# Patient Record
Sex: Female | Born: 1965 | Race: Black or African American | Hispanic: No | Smoking: Never smoker
Health system: Southern US, Community
[De-identification: ages and names within clinical notes are randomized; demographics above are authoritative.]

## PROBLEM LIST (undated history)

## (undated) DIAGNOSIS — M545 Low back pain, unspecified: Secondary | ICD-10-CM

## (undated) DIAGNOSIS — F419 Anxiety disorder, unspecified: Secondary | ICD-10-CM

## (undated) DIAGNOSIS — G478 Other sleep disorders: Secondary | ICD-10-CM

## (undated) DIAGNOSIS — N319 Neuromuscular dysfunction of bladder, unspecified: Secondary | ICD-10-CM

## (undated) DIAGNOSIS — D649 Anemia, unspecified: Secondary | ICD-10-CM

## (undated) DIAGNOSIS — M1990 Unspecified osteoarthritis, unspecified site: Secondary | ICD-10-CM

## (undated) DIAGNOSIS — G8929 Other chronic pain: Secondary | ICD-10-CM

## (undated) DIAGNOSIS — Z860101 Personal history of adenomatous and serrated colon polyps: Secondary | ICD-10-CM

## (undated) DIAGNOSIS — L8990 Pressure ulcer of unspecified site, unspecified stage: Secondary | ICD-10-CM

## (undated) DIAGNOSIS — N390 Urinary tract infection, site not specified: Secondary | ICD-10-CM

## (undated) DIAGNOSIS — G709 Myoneural disorder, unspecified: Secondary | ICD-10-CM

## (undated) DIAGNOSIS — I10 Essential (primary) hypertension: Secondary | ICD-10-CM

## (undated) DIAGNOSIS — Z86010 Personal history of colonic polyps: Secondary | ICD-10-CM

## (undated) DIAGNOSIS — T884XXA Failed or difficult intubation, initial encounter: Secondary | ICD-10-CM

## (undated) DIAGNOSIS — I639 Cerebral infarction, unspecified: Secondary | ICD-10-CM

## (undated) DIAGNOSIS — E1010 Type 1 diabetes mellitus with ketoacidosis without coma: Secondary | ICD-10-CM

## (undated) DIAGNOSIS — F329 Major depressive disorder, single episode, unspecified: Secondary | ICD-10-CM

## (undated) DIAGNOSIS — G2581 Restless legs syndrome: Secondary | ICD-10-CM

## (undated) DIAGNOSIS — G35 Multiple sclerosis: Secondary | ICD-10-CM

## (undated) DIAGNOSIS — R011 Cardiac murmur, unspecified: Secondary | ICD-10-CM

## (undated) DIAGNOSIS — K648 Other hemorrhoids: Secondary | ICD-10-CM

## (undated) DIAGNOSIS — E785 Hyperlipidemia, unspecified: Secondary | ICD-10-CM

## (undated) DIAGNOSIS — A419 Sepsis, unspecified organism: Secondary | ICD-10-CM

## (undated) DIAGNOSIS — M797 Fibromyalgia: Secondary | ICD-10-CM

## (undated) DIAGNOSIS — K219 Gastro-esophageal reflux disease without esophagitis: Secondary | ICD-10-CM

## (undated) DIAGNOSIS — I208 Other forms of angina pectoris: Secondary | ICD-10-CM

## (undated) DIAGNOSIS — I2089 Other forms of angina pectoris: Secondary | ICD-10-CM

## (undated) HISTORY — DX: Major depressive disorder, single episode, unspecified: F32.9

## (undated) HISTORY — DX: Other forms of angina pectoris: I20.8

## (undated) HISTORY — DX: Myoneural disorder, unspecified: G70.9

## (undated) HISTORY — PX: FRACTURE SURGERY: SHX138

## (undated) HISTORY — DX: Other hemorrhoids: K64.8

## (undated) HISTORY — DX: Personal history of colonic polyps: Z86.010

## (undated) HISTORY — DX: Hyperlipidemia, unspecified: E78.5

## (undated) HISTORY — DX: Essential (primary) hypertension: I10

## (undated) HISTORY — PX: BREAST SURGERY: SHX581

## (undated) HISTORY — DX: Cerebral infarction, unspecified: I63.9

## (undated) HISTORY — PX: COLONOSCOPY W/ BIOPSIES AND POLYPECTOMY: SHX1376

## (undated) HISTORY — DX: Gastro-esophageal reflux disease without esophagitis: K21.9

## (undated) HISTORY — DX: Cardiac murmur, unspecified: R01.1

## (undated) HISTORY — PX: EYE SURGERY: SHX253

## (undated) HISTORY — PX: CARDIAC CATHETERIZATION: SHX172

## (undated) HISTORY — PX: TRIGGER FINGER RELEASE: SHX641

## (undated) HISTORY — DX: Personal history of adenomatous and serrated colon polyps: Z86.0101

## (undated) HISTORY — PX: TUBAL LIGATION: SHX77

## (undated) HISTORY — DX: Anxiety disorder, unspecified: F41.9

## (undated) HISTORY — DX: Other forms of angina pectoris: I20.89

## (undated) HISTORY — PX: LAPAROSCOPIC CHOLECYSTECTOMY: SUR755

## (undated) HISTORY — PX: FOOT SURGERY: SHX648

## (undated) HISTORY — DX: Anemia, unspecified: D64.9

---

## 1988-02-28 DIAGNOSIS — F32A Depression, unspecified: Secondary | ICD-10-CM

## 1988-02-28 DIAGNOSIS — I639 Cerebral infarction, unspecified: Secondary | ICD-10-CM

## 1988-02-28 HISTORY — DX: Depression, unspecified: F32.A

## 1988-02-28 HISTORY — DX: Cerebral infarction, unspecified: I63.9

## 1998-02-03 ENCOUNTER — Emergency Department (HOSPITAL_COMMUNITY): Admission: EM | Admit: 1998-02-03 | Discharge: 1998-02-03 | Payer: Self-pay | Admitting: Emergency Medicine

## 1998-06-20 ENCOUNTER — Observation Stay (HOSPITAL_COMMUNITY): Admission: EM | Admit: 1998-06-20 | Discharge: 1998-06-21 | Payer: Self-pay | Admitting: *Deleted

## 1999-04-13 ENCOUNTER — Emergency Department (HOSPITAL_COMMUNITY): Admission: EM | Admit: 1999-04-13 | Discharge: 1999-04-13 | Payer: Self-pay | Admitting: Emergency Medicine

## 1999-05-01 ENCOUNTER — Inpatient Hospital Stay (HOSPITAL_COMMUNITY): Admission: EM | Admit: 1999-05-01 | Discharge: 1999-05-04 | Payer: Self-pay | Admitting: Emergency Medicine

## 1999-05-01 ENCOUNTER — Encounter (INDEPENDENT_AMBULATORY_CARE_PROVIDER_SITE_OTHER): Payer: Self-pay | Admitting: *Deleted

## 1999-05-03 ENCOUNTER — Encounter: Payer: Self-pay | Admitting: Internal Medicine

## 1999-05-09 ENCOUNTER — Encounter: Admission: RE | Admit: 1999-05-09 | Discharge: 1999-08-07 | Payer: Self-pay | Admitting: *Deleted

## 1999-05-11 ENCOUNTER — Encounter: Payer: Self-pay | Admitting: Emergency Medicine

## 1999-05-11 ENCOUNTER — Emergency Department (HOSPITAL_COMMUNITY): Admission: EM | Admit: 1999-05-11 | Discharge: 1999-05-11 | Payer: Self-pay | Admitting: Emergency Medicine

## 1999-05-27 ENCOUNTER — Encounter: Admission: RE | Admit: 1999-05-27 | Discharge: 1999-05-27 | Payer: Self-pay | Admitting: Sports Medicine

## 1999-06-23 ENCOUNTER — Emergency Department (HOSPITAL_COMMUNITY): Admission: EM | Admit: 1999-06-23 | Discharge: 1999-06-23 | Payer: Self-pay | Admitting: Emergency Medicine

## 1999-06-24 ENCOUNTER — Encounter: Admission: RE | Admit: 1999-06-24 | Discharge: 1999-06-24 | Payer: Self-pay | Admitting: Family Medicine

## 2000-03-22 ENCOUNTER — Inpatient Hospital Stay (HOSPITAL_COMMUNITY): Admission: EM | Admit: 2000-03-22 | Discharge: 2000-03-23 | Payer: Self-pay | Admitting: Emergency Medicine

## 2000-03-23 ENCOUNTER — Encounter: Payer: Self-pay | Admitting: Family Medicine

## 2000-03-27 ENCOUNTER — Encounter: Admission: RE | Admit: 2000-03-27 | Discharge: 2000-03-27 | Payer: Self-pay | Admitting: Family Medicine

## 2000-07-26 ENCOUNTER — Ambulatory Visit (HOSPITAL_COMMUNITY): Admission: RE | Admit: 2000-07-26 | Discharge: 2000-07-26 | Payer: Self-pay | Admitting: Cardiology

## 2001-03-05 ENCOUNTER — Encounter: Payer: Self-pay | Admitting: Internal Medicine

## 2001-03-05 ENCOUNTER — Ambulatory Visit (HOSPITAL_COMMUNITY): Admission: RE | Admit: 2001-03-05 | Discharge: 2001-03-05 | Payer: Self-pay | Admitting: Internal Medicine

## 2001-03-09 ENCOUNTER — Emergency Department (HOSPITAL_COMMUNITY): Admission: EM | Admit: 2001-03-09 | Discharge: 2001-03-09 | Payer: Self-pay

## 2001-03-22 ENCOUNTER — Emergency Department (HOSPITAL_COMMUNITY): Admission: EM | Admit: 2001-03-22 | Discharge: 2001-03-22 | Payer: Self-pay | Admitting: Emergency Medicine

## 2001-03-27 ENCOUNTER — Encounter (HOSPITAL_BASED_OUTPATIENT_CLINIC_OR_DEPARTMENT_OTHER): Payer: Self-pay | Admitting: General Surgery

## 2001-03-29 ENCOUNTER — Encounter (HOSPITAL_BASED_OUTPATIENT_CLINIC_OR_DEPARTMENT_OTHER): Payer: Self-pay | Admitting: General Surgery

## 2001-03-29 ENCOUNTER — Ambulatory Visit (HOSPITAL_COMMUNITY): Admission: RE | Admit: 2001-03-29 | Discharge: 2001-03-30 | Payer: Self-pay | Admitting: General Surgery

## 2001-03-29 ENCOUNTER — Encounter (INDEPENDENT_AMBULATORY_CARE_PROVIDER_SITE_OTHER): Payer: Self-pay | Admitting: Specialist

## 2001-04-01 ENCOUNTER — Inpatient Hospital Stay (HOSPITAL_COMMUNITY): Admission: EM | Admit: 2001-04-01 | Discharge: 2001-04-03 | Payer: Self-pay | Admitting: Emergency Medicine

## 2001-04-05 ENCOUNTER — Ambulatory Visit: Admission: RE | Admit: 2001-04-05 | Discharge: 2001-04-05 | Payer: Self-pay | Admitting: Family Medicine

## 2001-04-11 ENCOUNTER — Encounter: Admission: RE | Admit: 2001-04-11 | Discharge: 2001-07-10 | Payer: Self-pay | Admitting: Family Medicine

## 2001-07-25 ENCOUNTER — Other Ambulatory Visit: Admission: RE | Admit: 2001-07-25 | Discharge: 2001-07-25 | Payer: Self-pay | Admitting: Ophthalmology

## 2002-04-01 ENCOUNTER — Emergency Department (HOSPITAL_COMMUNITY): Admission: EM | Admit: 2002-04-01 | Discharge: 2002-04-01 | Payer: Self-pay | Admitting: *Deleted

## 2002-05-02 ENCOUNTER — Emergency Department (HOSPITAL_COMMUNITY): Admission: EM | Admit: 2002-05-02 | Discharge: 2002-05-02 | Payer: Self-pay

## 2002-09-30 ENCOUNTER — Inpatient Hospital Stay (HOSPITAL_COMMUNITY): Admission: AD | Admit: 2002-09-30 | Discharge: 2002-10-01 | Payer: Self-pay | Admitting: Family Medicine

## 2003-01-07 ENCOUNTER — Inpatient Hospital Stay (HOSPITAL_COMMUNITY): Admission: EM | Admit: 2003-01-07 | Discharge: 2003-01-09 | Payer: Self-pay | Admitting: Emergency Medicine

## 2003-07-09 ENCOUNTER — Emergency Department (HOSPITAL_COMMUNITY): Admission: EM | Admit: 2003-07-09 | Discharge: 2003-07-09 | Payer: Self-pay | Admitting: *Deleted

## 2003-07-14 ENCOUNTER — Emergency Department (HOSPITAL_COMMUNITY): Admission: EM | Admit: 2003-07-14 | Discharge: 2003-07-14 | Payer: Self-pay | Admitting: Emergency Medicine

## 2003-08-11 ENCOUNTER — Ambulatory Visit (HOSPITAL_COMMUNITY): Admission: RE | Admit: 2003-08-11 | Discharge: 2003-08-11 | Payer: Self-pay | Admitting: Endocrinology

## 2003-11-06 ENCOUNTER — Ambulatory Visit (HOSPITAL_COMMUNITY): Admission: RE | Admit: 2003-11-06 | Discharge: 2003-11-06 | Payer: Self-pay | Admitting: Internal Medicine

## 2003-11-06 ENCOUNTER — Encounter (INDEPENDENT_AMBULATORY_CARE_PROVIDER_SITE_OTHER): Payer: Self-pay | Admitting: Specialist

## 2004-08-18 ENCOUNTER — Encounter: Admission: RE | Admit: 2004-08-18 | Discharge: 2004-10-06 | Payer: Self-pay | Admitting: Orthopedic Surgery

## 2004-10-10 ENCOUNTER — Encounter: Admission: RE | Admit: 2004-10-10 | Discharge: 2005-01-08 | Payer: Self-pay | Admitting: Orthopedic Surgery

## 2004-10-19 ENCOUNTER — Emergency Department (HOSPITAL_COMMUNITY): Admission: EM | Admit: 2004-10-19 | Discharge: 2004-10-19 | Payer: Self-pay | Admitting: Family Medicine

## 2004-11-05 ENCOUNTER — Emergency Department (HOSPITAL_COMMUNITY): Admission: EM | Admit: 2004-11-05 | Discharge: 2004-11-06 | Payer: Self-pay | Admitting: Emergency Medicine

## 2005-05-10 ENCOUNTER — Emergency Department (HOSPITAL_COMMUNITY): Admission: EM | Admit: 2005-05-10 | Discharge: 2005-05-10 | Payer: Self-pay | Admitting: Family Medicine

## 2005-06-28 ENCOUNTER — Encounter: Payer: Self-pay | Admitting: Internal Medicine

## 2005-06-30 ENCOUNTER — Encounter: Admission: RE | Admit: 2005-06-30 | Discharge: 2005-06-30 | Payer: Self-pay | Admitting: Family Medicine

## 2005-07-20 ENCOUNTER — Inpatient Hospital Stay (HOSPITAL_COMMUNITY): Admission: EM | Admit: 2005-07-20 | Discharge: 2005-07-21 | Payer: Self-pay | Admitting: *Deleted

## 2006-01-01 ENCOUNTER — Emergency Department (HOSPITAL_COMMUNITY): Admission: EM | Admit: 2006-01-01 | Discharge: 2006-01-02 | Payer: Self-pay | Admitting: Emergency Medicine

## 2006-04-10 ENCOUNTER — Ambulatory Visit: Payer: Self-pay | Admitting: Internal Medicine

## 2006-04-24 ENCOUNTER — Ambulatory Visit (HOSPITAL_COMMUNITY): Admission: RE | Admit: 2006-04-24 | Discharge: 2006-04-24 | Payer: Self-pay | Admitting: Internal Medicine

## 2006-04-24 DIAGNOSIS — K648 Other hemorrhoids: Secondary | ICD-10-CM

## 2006-04-24 HISTORY — DX: Other hemorrhoids: K64.8

## 2006-04-27 ENCOUNTER — Ambulatory Visit: Payer: Self-pay | Admitting: Internal Medicine

## 2006-05-22 ENCOUNTER — Ambulatory Visit (HOSPITAL_COMMUNITY): Admission: RE | Admit: 2006-05-22 | Discharge: 2006-05-23 | Payer: Self-pay | Admitting: Ophthalmology

## 2006-12-09 ENCOUNTER — Emergency Department (HOSPITAL_COMMUNITY): Admission: EM | Admit: 2006-12-09 | Discharge: 2006-12-09 | Payer: Self-pay | Admitting: Emergency Medicine

## 2007-02-03 ENCOUNTER — Ambulatory Visit (HOSPITAL_BASED_OUTPATIENT_CLINIC_OR_DEPARTMENT_OTHER): Admission: RE | Admit: 2007-02-03 | Discharge: 2007-02-03 | Payer: Self-pay | Admitting: Neurology

## 2007-03-14 ENCOUNTER — Ambulatory Visit: Payer: Self-pay | Admitting: Pulmonary Disease

## 2007-04-10 ENCOUNTER — Emergency Department (HOSPITAL_COMMUNITY): Admission: EM | Admit: 2007-04-10 | Discharge: 2007-04-10 | Payer: Self-pay | Admitting: Emergency Medicine

## 2007-07-31 ENCOUNTER — Encounter: Admission: RE | Admit: 2007-07-31 | Discharge: 2007-09-02 | Payer: Self-pay | Admitting: Orthopedic Surgery

## 2007-09-26 ENCOUNTER — Encounter: Admission: RE | Admit: 2007-09-26 | Discharge: 2007-12-25 | Payer: Self-pay | Admitting: Orthopedic Surgery

## 2008-06-27 ENCOUNTER — Emergency Department (HOSPITAL_COMMUNITY): Admission: EM | Admit: 2008-06-27 | Discharge: 2008-06-27 | Payer: Self-pay | Admitting: Emergency Medicine

## 2008-09-04 ENCOUNTER — Encounter: Admission: RE | Admit: 2008-09-04 | Discharge: 2008-09-04 | Payer: Self-pay | Admitting: Neurology

## 2009-12-28 HISTORY — PX: ENDOMETRIAL ABLATION W/ NOVASURE: SUR434

## 2010-07-06 ENCOUNTER — Ambulatory Visit (INDEPENDENT_AMBULATORY_CARE_PROVIDER_SITE_OTHER): Payer: Self-pay | Admitting: Internal Medicine

## 2010-07-06 ENCOUNTER — Encounter: Payer: Self-pay | Admitting: Internal Medicine

## 2010-07-06 ENCOUNTER — Ambulatory Visit: Payer: Self-pay | Admitting: Licensed Clinical Social Worker

## 2010-07-06 ENCOUNTER — Ambulatory Visit (INDEPENDENT_AMBULATORY_CARE_PROVIDER_SITE_OTHER): Payer: Self-pay | Admitting: Dietician

## 2010-07-06 ENCOUNTER — Other Ambulatory Visit: Payer: Self-pay | Admitting: Internal Medicine

## 2010-07-06 DIAGNOSIS — E109 Type 1 diabetes mellitus without complications: Secondary | ICD-10-CM

## 2010-07-06 DIAGNOSIS — F329 Major depressive disorder, single episode, unspecified: Secondary | ICD-10-CM

## 2010-07-06 DIAGNOSIS — G43909 Migraine, unspecified, not intractable, without status migrainosus: Secondary | ICD-10-CM | POA: Insufficient documentation

## 2010-07-06 DIAGNOSIS — J45909 Unspecified asthma, uncomplicated: Secondary | ICD-10-CM | POA: Insufficient documentation

## 2010-07-06 DIAGNOSIS — E119 Type 2 diabetes mellitus without complications: Secondary | ICD-10-CM

## 2010-07-06 DIAGNOSIS — I10 Essential (primary) hypertension: Secondary | ICD-10-CM | POA: Insufficient documentation

## 2010-07-06 LAB — BASIC METABOLIC PANEL WITH GFR
BUN: 9 mg/dL (ref 6–23)
Calcium: 9.4 mg/dL (ref 8.4–10.5)
Chloride: 97 mEq/L (ref 96–112)
Creat: 0.59 mg/dL (ref 0.40–1.20)
GFR, Est African American: >60 mL/min (ref >60)
GFR, Est Non African American: >60 mL/min (ref >60)

## 2010-07-06 LAB — POCT GLYCOSYLATED HEMOGLOBIN (HGB A1C): Hemoglobin A1C: 11.4

## 2010-07-06 MED ORDER — FUROSEMIDE 20 MG PO TABS: 20.0000 mg | ORAL_TABLET | Freq: Two times a day (BID) | ORAL | Status: DC

## 2010-07-06 MED ORDER — CLONIDINE HCL 0.3 MG PO TABS: 0.3000 mg | ORAL_TABLET | Freq: Two times a day (BID) | ORAL | Status: DC

## 2010-07-06 MED ORDER — INSULIN LISPRO 100 UNIT/ML ~~LOC~~ SOLN: SUBCUTANEOUS | Status: DC

## 2010-07-06 MED ORDER — LISINOPRIL 40 MG PO TABS: 40.0000 mg | ORAL_TABLET | Freq: Every day | ORAL | Status: DC

## 2010-07-06 MED ORDER — INSULIN ASPART 100 UNIT/ML ~~LOC~~ SOLN: SUBCUTANEOUS | Status: DC

## 2010-07-06 NOTE — Assessment & Plan Note (Signed)
As mentioned history of present illness, she is getting samples from Dr. Daphine Deutscher for her hypertension medications. She has been tried on multiple medications in the past but her pressure stays high.  We'll try to get records from Dr. Ermalene Searing office, which will be greatly helpful because she's following her since 2001. -We'll start her on lisinopril, Lasix, clonidine considering these being in the $4 group. - Check her BMP shows creatinine 0.59 and potassium 4.3. -Wanted to start spironolactone today but will not start it along with ACE inhibitor, at the same time due to high risk of severe hyperkalemia. -So we'll give her Lasix today and recheck her BMP next visit and start adding and changing blood pressure medications and felt needed.

## 2010-07-06 NOTE — Progress Notes (Signed)
30 minutes.  Soc. Work.  Referred due to uncontrolled diabetes and depression for assessment and planning.   Debra [name] met with me in my office and I find a very pleasant and forthcoming woman.   She gave me some history in that she has adult children and lives with her boyfriend who supports her right now.  She has no income and tells me she cannot work due to low vision, peripheral neuropathy, trigger finger and IBS.  She has applied for Disability several times over the years but was denied.  She reports that she cannot work because of her hands and vision as well as severe diarrhea.  She was dropped off today at the clinics and tells me she can get to appointments if given time to arrange.   She is seeing Chauncey Reading today to provide all financial information and this will give her needed access.  She had previously qualified for Medicaid however she and her children have aged out of that program.   We talked about the relationship between uncontrolled diabetes and depression and that will be extremely important to get that under control.  I did give her information about counseling and she can think about that option thru [facility].  She has been on Cymbalta for many years and that has helped her depression somewhat.   I did give her information on self-management of depression.  She also seems to be isolated and tells me she doesn't do very much outside of her home.   SW will continue to work with patient.

## 2010-07-06 NOTE — Progress Notes (Signed)
Diabetes Self-Management Training (DSMT)  Initial Visit  07/06/2010 Ms. Torre Pikus, identified by name and date of birth, is a 45 y.o. female with Type 1 Diabetes. Year of diabetes diagnosis: age 65 - 2 years after son was born. Other persons present: no  ASSESSMENT Patient concerns are Problem solving, Support and financial guidance.  Last menstrual period 06/25/2010. There is no height or weight on file to calculate BMI. No results found for this basename: LDLCALC   Lab Results  Component Value Date   HGBA1C 11.4 07/06/2010   Medication Nutrition Monitor: One Touch Ultra Link- strips from 2010, but control solution read within range indicating she is still getting accurate results  Labs reviewed.  DIABETES BUNDLE: A1C in past 6 months? Yes.  Less than 7%? No LDL in past year? No. Microalbumin ratio in past year? No. Patient taking ACE or ARB? No.  Sent note to MD. Blood pressure less than 130/80? No.  Sent note to MD. Foot exam in last year? No. Sent note to MD. Eye exam in past year? No.  Reminded patient to schedule eye exam. Tobacco use? No. Pneumovax? No Flu vaccine? No Asprin? No  Family history of diabetes: No  Support systems: talks to children almost daily, one lives near [place] the other in [place]   Special needs: Unable to determine  Prior DM Education: Yes   Medications See Medications list.  Has adequate knowledge and Needs skills/knowledge review  Patients belief/attitude about diabetes: No matter what I do I can't control my blood glucose levels.  Self foot exams daily: No  Diabetes Complications: Neuropathy Retinopathy Gastroparesis   Exercise Plan Doing ADLs for 15 minutesa day.   Self-Monitoring Frequency of testing: 2 times/day See meter download from today  Hyperglycemia: Yes Daily Hypoglycemia: Yes Daily   Meal Planning Some knowledge and not able to fully assess her skills, but verbalizes adequate basic    Assessment  comments: patient says her diabetes has never been well controlled and describes living with diabetes as being a "nightmare". She reports having "brittle" diabetes. Current rates in pump set by Dr. Loreli Slot in 2009. Chronic diarrhea since gallbladder removed. Operations on feet and hands. Highest non-pregnant weight was in 200s, clearly shows abdominal obesity today. Patient reports she has been on the insulin pump since 2008, prior to that had been on various regimens up to a total daily dose of 600 units: regimens  included lantus and novolog, Humulin N and R, Humulin R U 500 in pump and trial of symlin which caused nausea, Currently hs 90 loan of Med[employer]onic Paradigm revel pump, using same infusion set since March- has about 3 more at home, reports having plenty of syringes. CDE checked clock was accurate- unable to download pump as yet, but reported setting are as follows:  Basal rates:                                              Insulin to carb ratio = 4 units/15 grams 12 am-4 am    3.5 units/hr                         Sensitivity Factor = 25 (1 units lowers her blood sugar  4 am- 11 am- 4.5 units/hr                                                               ~  25 mg/dl) 11 am-5 Pm 4.8 units/hr                            Target Blood sugar in pump settings is 1110-120 5 Pm- 12 am 5.0 units/hr  Total Daily dose = 99.7 units/day Requested last 25 boluses in her bolus history, but was told her old pump recently broke and she had to begin using this loaner so only has last 4 days of boluses: 5/9- none  5/8  1930- 25 units 5/4  1812- 11.9 units        1229- 12 units        1148 12.7 units          840  10 units  Patient reports no alcohol or drugs other than physician prescribed. Preeclampsia with pregnancy- otherwise no complications.   Although patient reports daily fasting lows that she treats without checking and often turns pump off at night. She turned it off last night from midnight to 3 am     INDIVIDUAL DIABETES EDUCATION PLAN:  Psychosocial adjustment _______________________________________________________________________  Intervention TOPICS COVERED TODAY:  Psychosocial adjustment  Worked with patient to identify barriers to care and solutions Helped patient identify a support system for diabetes management Identified and addressed patients feelings and concerns about diabetes  PATIENTS GOALS/PLAN (copy and paste in patient instructions so patient receives a copy): 1.  Learning Objective:       Referred to social work for significant emotional difficulty and to investigate counseling for depression, reported agoraphobia.  2.  Behavioral Objective:         Healthy Coping: For help with diabetes control, I will discuss diabetes with social work  Sometimes 25%  Personalized Follow-Up Plan for Ongoing Self Management Support:  Doctor's Office and CDE visits ______________________________________________________________________   Outcomes Expected outcomes: Demonstrated limited interest in learning. Expect minimal changes.  Self-care Barriers: Debilitated state due to current medical condition, Lack of material resources, Coping skills, Cognitive impairment  Education material provided: no  Patient to contact team via Phone if problems or questions.  Time in: 1030     Time out: 1130  Future DSMT - 2 wks   RILEY,Nykerria Macconnell          Addendum_ discussed patient care with social work and physicians. Suggested to both patient and physicians that patient consider injection therapy as she is high risk of infections using same infusion set for > 2 months and does not have future access to pump after 90 days up ( 5000.00 per patient) or supplies reservoirs (has been refilling same reservoir for > 2 months)  and infusion sets which are also very expensive.  Approval by FDA only for 72 hours

## 2010-07-06 NOTE — Assessment & Plan Note (Signed)
She says she uses albuterol inhaler as needed-on average once daily, and Flovent. Records from Dr. Daphine Deutscher office would be greatly helpful.

## 2010-07-06 NOTE — Progress Notes (Signed)
  Subjective:    Patient ID: Debra [name], female    DOB: March 23, 1965, 45 y.o.   MRN: [id number]  HPI Debra [name] is a 45 year old woman with past medical history of diabetes mellitus type 1, hypertension, asthma, migraines who comes to the clinic for the first time. She was been followed by Dr. Daphine Deutscher at an [place] [facility], [facility] and by Dr. Lucianne Muss, endocrinology, none of them she has been seeing for about 2 years due to problems with insurance. Although she says she gets samples from Dr. Daphine Deutscher for her blood pressure medicines. Diabetes-She was diagnosed with diabetes mellitus at age of 40 and since then she has uncontrolled diabetes with HbA1c today of 11.7. At present she is on insulin pump which she is using since 2009. She has been tried on different agents including U. 100 and a U. 500 in the past, At present she is on Humalog.  Hypertension-she doesn't have any prescription medication but that sample from Dr. Daphine Deutscher. She says her systolic blood pressure stays above 200 all the time, even though being on valsartan, amlodipine, clonidine. She has been tried multiple different regimens in the past but still has uncontrolled high blood-pressure. She had MR angiogram of her abdomen in 2007 to check for any renal artery stenosis but was normal. She has last sample of medications left with her for her blood pressure. Asthma she says she's on albuterol inhaler which she uses about once daily and Flovent. No complaints of cough, shortness of breath, wheezing, fever at present.  She also complains of diarrhea-she has multiple watery bowel movements everyday since she has cholecystectomy in 2006. She is on Imodium and diphenoxylate but still has diarrhea. Has abdominal cramping along with that. Denies any blood in the stool or unusual foul-smelling.    Review of Systems    denies abdominal pain, chest pain, night sweats, chills, unintended weight loss, decreased appetite. Objective:   Physical Exam    Constitutional: Vital signs reviewed.  Patient is a well-developed and well-nourished in no acute distress and cooperative with exam. Alert and oriented x3.  Head: Normocephalic and atraumatic Mouth: no erythema or exudates, MMM Eyes: PERRL, EOMI, conjunctivae normal, No scleral icterus.  Neck: Supple, Trachea midline normal ROM, No JVD, mass, thyromegaly, or carotid bruit present.  Cardiovascular: RRR, S1 normal, S2 normal, no MRG, pulses symmetric and intact bilaterally Pulmonary/Chest: CTAB, no wheezes, rales, or rhonchi Abdominal: Soft. Non-tender, non-distended, bowel sounds are normal, no masses, organomegaly, or guarding present.  GU: no CVA tenderness Musculoskeletal: No joint deformities, erythema, or stiffness, ROM full and no nontender Neurological: A&O x3, Strenght is normal and symmetric bilaterally, cranial nerve II-XII are grossly intact, no focal motor deficit Skin: Warm, dry and intact. No rash, cyanosis, or clubbing.  scars on left wrist and ankle from old surgeries.     Assessment & Plan:

## 2010-07-06 NOTE — Patient Instructions (Signed)
Please make a followup appointment in 7-10 days. Please get the prescription filled for the past medications namely lisinopril, Lasix, clonidine and start taking them as prescribed from today. Also we will give NovoLog from the clinic today so that you can use that before he can get started with medication assistance program and get insulin from there. Will see you back in a week and discuss in further detail about the management.

## 2010-07-06 NOTE — Assessment & Plan Note (Addendum)
She has a severely uncontrolled diabetes and she will and she'll pump per Dr. Lucianne Muss with endocrinology. She hasn't seen him for about 2 years. Her HbA1c is 11.7 today. She talked with Jamison Neighbor during this visit to try to assess her insulin needs. As per her recent needs from the pump record, she likely needs about 200 units of Humalog every day. - We'll try to get records from Dr. Remus Blake office. - Also gave 1 vial of NovoLog to take home, unless she can get associated with his medication assistance program, and information regarding that was given to her. - She got her orange got today and so we'll be able to get insulin much cheaper with that. -Will call her back in one week and will further changes will be made during the next visit

## 2010-07-07 ENCOUNTER — Telehealth: Payer: Self-pay | Admitting: *Deleted

## 2010-07-07 NOTE — Telephone Encounter (Signed)
Health dept pharm calls stating that pt got scripts filled elsewhere yesterday but has not taken any of the meds, said she was waiting to start them sometime today. Also presented to them a bag of meds that she states she has been taking and states she just got them yesterday, most are out of date by 3years. The health dept will be faxing a list with each listed. They have instructed her to use only the meds prescribed at yesterdays visit but the pharmacist felt you needed a copy of the list and wanted to let you know what she had stated there. It will be placed in your box.

## 2010-07-08 ENCOUNTER — Encounter: Payer: Self-pay | Admitting: Internal Medicine

## 2010-07-12 NOTE — Procedures (Signed)
NAMEBARBARAANN, Debra [name]                 ACCOUNT NO.:  [id number]   MEDICAL RECORD NO.:  [id number]          PATIENT TYPE:  OUT   LOCATION:  SLEEP CENTER                 FACILITY[facility]   PHYSICIAN:  Debra [name] Share, MD,FCCPDATE OF BIRTH:  08-04-67   DATE OF STUDY:  02/03/2007                            NOCTURNAL POLYSOMNOGRAM   REFERRING PHYSICIAN:  Evie Lacks, MD   LOCATION:  Sleep Lab.   INDICATION FOR STUDY:  780.59   EPWORTH SCORE:  16   SLEEP ARCHITECTURE:  The patient had a total sleep time of 337 minutes  with no slow wave sleep and only 48 minutes of REM.  Sleep onset latency  was prolonged at 44 minutes, and REM onset was prolonged at 132 minutes.  Sleep efficiency was decreased at 78 percent.   RESPIRATORY DATA:  The patient was found to have no obstructive apneas  and only 1 obstructive hypopnea for an apnea-hypopnea index less than 1.  There was light snoring noted during the night.   OXYGEN DATA:  There was O2 desaturation transiently to 90 percent, but  no clinically significant event was noted.   CARDIAC DATA:  No significant arrhythmias noted during the night.   MOVEMENT/PARASOMNIA:  The patient was noted to have 106 periodic leg  movements during the night, but only 2 resulted in arousal or awakening.  No abnormal behaviors or seizure activity was noted.   IMPRESSION/RECOMMENDATION:  1. Very small numbers of obstructive events which do not meet the      apnea/hypopnea index criteria for the obstructive sleep apnea      syndrome.  2. Large numbers of leg jerks with very few of them resulting in      arousal or awakening.     Debra [name] Share, MD,FCCP  Diplomate, American Board of Sleep  Medicine  Electronically Signed    [facility]/MEDQ  D:  03/14/2007 13:47:25  T:  03/14/2007 15:07:11  Job:  [id number]

## 2010-07-13 ENCOUNTER — Encounter: Payer: Self-pay | Admitting: Internal Medicine

## 2010-07-13 ENCOUNTER — Ambulatory Visit (INDEPENDENT_AMBULATORY_CARE_PROVIDER_SITE_OTHER): Payer: Self-pay | Admitting: Internal Medicine

## 2010-07-13 VITALS — BP 185/96 | HR 83 | Temp 98.0°F | Ht 64.5 in | Wt 192.9 lb

## 2010-07-13 DIAGNOSIS — N926 Irregular menstruation, unspecified: Secondary | ICD-10-CM

## 2010-07-13 DIAGNOSIS — R197 Diarrhea, unspecified: Secondary | ICD-10-CM

## 2010-07-13 DIAGNOSIS — N920 Excessive and frequent menstruation with regular cycle: Secondary | ICD-10-CM

## 2010-07-13 DIAGNOSIS — M25532 Pain in left wrist: Secondary | ICD-10-CM

## 2010-07-13 DIAGNOSIS — I10 Essential (primary) hypertension: Secondary | ICD-10-CM

## 2010-07-13 DIAGNOSIS — M25539 Pain in unspecified wrist: Secondary | ICD-10-CM

## 2010-07-13 DIAGNOSIS — E109 Type 1 diabetes mellitus without complications: Secondary | ICD-10-CM

## 2010-07-13 DIAGNOSIS — N939 Abnormal uterine and vaginal bleeding, unspecified: Secondary | ICD-10-CM | POA: Insufficient documentation

## 2010-07-13 LAB — GLUCOSE, CAPILLARY: Glucose-Capillary: 282 mg/dL — ABNORMAL HIGH (ref 70–99)

## 2010-07-13 MED ORDER — WAVESENSE PRESTO W/DEVICE KIT: PACK | Status: DC

## 2010-07-13 MED ORDER — INSULIN LISPRO 100 UNIT/ML ~~LOC~~ SOLN: SUBCUTANEOUS | Status: DC

## 2010-07-13 MED ORDER — DICYCLOMINE HCL 20 MG PO TABS: ORAL_TABLET | ORAL | Status: DC

## 2010-07-13 MED ORDER — INSULIN ASPART 100 UNIT/ML ~~LOC~~ SOLN: SUBCUTANEOUS | Status: DC

## 2010-07-13 NOTE — Assessment & Plan Note (Addendum)
She will insulin pump and will continue her regimen same for now. Give her prescription for 60 ml of NovoLog and Humalog whichever she can get from the medication assistance program as she not have to pay more money for more frequent refills

## 2010-07-13 NOTE — Patient Instructions (Signed)
Please make a followup appointment in 10-14 days with Dr. Allena Katz. Will do the referrals to gynecologist and sports medicine clinic to help with her problems. Also use Benadryl as needed every 6 hours for cramping abdominal pain. I will give you a call back and let you know about the new medication for blood pressure to pick up from the pharmacy, after the lab tests comes back with

## 2010-07-13 NOTE — Assessment & Plan Note (Addendum)
Her blood pressure and is still high- 185/96. She says that she is compliant with medications but she always runs blood pressure in 200s. Will check a BMP for potassium level and does not know we'll add spironolactone 25 mg by mouth daily and followup in about 2 weeks. 07/14/2010-- 4.45 pm- called patient about getting the new prescription for spironolactone filled from [facility].

## 2010-07-13 NOTE — Assessment & Plan Note (Signed)
She has Mirena  placed as per her.  She's been having uterine bleeding f for a long time now and hasn't seen a gynecologist for about 4 or 5 years. Will refer her to now with this hospital for further management.

## 2010-07-13 NOTE — Progress Notes (Signed)
  Subjective:    Patient ID: Debra [name], female    DOB: May 01, 1965, 45 y.o.   MRN: [id number]  HPI Ms. Ruddell is a 45 year old woman with extensive past medical history of DM 1, uncontrolled HTN because the clinic for a followup visit. She was just established as a new patient a week before. Her blood pressure today is 185/96.  She has no symptoms of chest pain, shortness of breath, cough, fever, chills. She does complain of constant pain in bilateral hand and foot and says that she had multiple surgeries in her wrist and ankle.      Review of Systems    as per history of present illness Objective:   Physical Exam    Constitutional: Vital signs reviewed.  Patient is a well-developed and well-nourished in no acute distress and cooperative with exam. Alert and oriented x3.  Head: Normocephalic and atraumatic Mouth: no erythema or exudates, MMM Eyes: PERRL, EOMI, conjunctivae normal, No scleral icterus.  Neck: Supple, Trachea midline normal ROM, No JVD, mass, thyromegaly, or carotid bruit present.  Cardiovascular: RRR, S1 normal, S2 normal, no MRG, pulses symmetric and intact bilaterally Pulmonary/Chest: CTAB, no wheezes, rales, or rhonchi Abdominal: Soft. Non-tender, non-distended, bowel sounds are normal, no masses, organomegaly, or guarding present.  GU: no CVA tenderness Musculoskeletal: No significant joint deformities or tenderness in hands and feet. No swelling, redness.  Neurological: A&O x3, Strenght is normal and symmetric bilaterally, cranial nerve II-XII are grossly intact. Skin: Warm, dry and intact. No rash, cyanosis, or clubbing.      Assessment & Plan:

## 2010-07-14 LAB — BASIC METABOLIC PANEL WITH GFR
BUN: 12 mg/dL (ref 6–23)
Calcium: 8.8 mg/dL (ref 8.4–10.5)
Chloride: 100 mEq/L (ref 96–112)
GFR, Est African American: >60 mL/min (ref >60)
Potassium: 4.3 mEq/L (ref 3.5–5.3)

## 2010-07-14 MED ORDER — SPIRONOLACTONE 25 MG PO TABS: 25.0000 mg | ORAL_TABLET | Freq: Every day | ORAL | Status: DC

## 2010-07-14 NOTE — Progress Notes (Signed)
Addended by: Lyn Hollingshead on: 07/14/2010 04:40 PM   Modules accepted: Orders

## 2010-07-15 NOTE — H&P (Signed)
[facility]  Patient:    Debra [name], Debra [name]                        MRN: [id number] Adm. Date:  [id number] Attending:  McDiarmid, Leighton Roach. Dictator:   Cheree Ditto, M.D. CC:         Kinnie Scales. Reed Breech, M.D.   History and Physical  HISTORY OF PRESENT ILLNESS:  Ms. Scarfo is a 45 year old African-American female with type 1 diabetes diagnosed when she was approximately age 38 who presents with hyperglycemia.  She noted her sugars being higher than usual this morning, as high as 528 at home. She has been out of NPH all of January and has been using only sliding scale regular insulin. Her sugars have been 200 to 500s at home.  The patient has been completely out of her regular insulin today due to financial constraints.  She is very thirsty and urinating frequently.  No sick contacts.  The patient has "lost count" of the number of admissions she has had for diabetes ketoacidosis.  REVIEW OF SYSTEMS:  Constitutional:  No fevers.  No weight loss. Cardiovascular:  Positive palpitations.  Respiratory:  Positive dyspnea.  GI: Positive abdominal pain.  Positive diarrhea this afternoon.  Skin: No rash. Neurologic:  Very sleepy.  Psychiatric: History of depression.  GU:  No dysuria.  Positive frequency.  She does have heavy menses.  PAST MEDICAL HISTORY: 1. Diabetes mellitus type 1. 2. Iron deficiency anemia. 3. Migraines. 4. Hypertension. 5. Asthma. 6. History of elevated transaminases. 7. Small gallstones diagnosed on ultrasound March 2001.  MEDICATIONS:  The patient is currently only taking sliding scale regular insulin approximately four times a day.  She has been prescribed:  Azmacort two puffs b.i.d.  Humulin NPH 25 q.a.m. 15 units q.p.m.  Midrin p.r.n. Neurontin q.d.  The patient unsure of dose.  Norvasc 10 mg q.d.  Pamelor 25 mg q.d.  Protonix 40 mg q.d.  Serevent two puffs b.i.d.  Tegretol 100 mg t.i.d. Vasotec 5 mg q.d.  ALLERGIES:  No known drug  allergies.  PAST SURGICAL HISTORY:  She is status post cesarean section.  Colonoscopy with polypectomy.  SOCIAL HISTORY:  She does not smoke. She lives in [place].  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  No alcohol, tobacco or other drugs.  She has financial problems.  FAMILY HISTORY:  Positive for hypertension in her mother and uncle.  Positive for coronary artery disease in her mother. Positive brain tumor in her mother.  PHYSICAL EXAMINATION:  VITAL SIGNS:  Heart rate 111, temperature 97.9, respiratory rate 18, O2 saturation 100% on room air.  Blood pressure 146/56.  GENERAL:  She is pleasant in no acute distress.  HEENT:  Pupils are equal, round and reactive to light and accommodation. Extraocular movements are intact.  Oral pharynx reveals dry mucous membranes but no exudate or erythema.  HEART:  Tachycardic with 2/6 systolic murmur heard best at the left upper sternal border.  LUNGS: Clear to auscultation bilaterally.  ABDOMEN:  Soft and nontender.  Positive bowel sounds.  No organomegaly.  No masses.  EXTREMITIES:  No clubbing, cyanosis or edema.  2+ pulses in the lower extremities bilaterally.  Cranial nerves 2 through 12 are intact with normal strength and reflexes in the upper and lower extremities bilaterally.  LABORATORY DATA:  White count 10.5, hemoglobin 13.6, hematocrit 43.8, platelets 444 with 74% neutrophils, 20% lymphs, 3% monocytes, 0% eosinophils and 1%  basophils.  pH 7.143, pCO2 22, bicarbonate 8, anion gag 21.  Sodium 131, potassium 4.9, chloride 102, BUN 15, creatinine 0.5, glucose 654.  ASSESSMENT/PLAN:  A 45 year old African-American female who presents with diabetic ketoacidosis.  1. Diabetic ketoacidosis:  Likely triggered as the patient being out of    insulin.  She has not been getting any basal rate for almost one month.    Will evaluate for infection with urinalysis and chest x-ray.  Will    admit to a step down unit. Will hydrate  aggressively.  Will continue an    insulin drip at 7 units an hour.  Will add D5 to the fluids when the    CBG is less than or equal to 250.  Will continue insulin infusion    until the anion gap closes.  Will replace potassium. Will start    subcutaneous insulin later during the hospitalization.  Will have care    manager see about access to medicines and medical care. 2. History of cholelithiasis:  Will check a CMET. 3. History of hypertension:  Will follow blood pressure off of medications.    May need to start medicines prior to discharge. 4. Asthma:  Start home medications prior to discharge and encourage    compliance. 5. Chronic migraine headaches:  The patient takes Midrin on an as needed    basis.  Will need to clarify dosage of Neurontin and start that medicine    and Tegretol prior to discharge. DD:  03/22/00 TD:  03/23/00 Job: [id number]

## 2010-07-15 NOTE — Discharge Summary (Signed)
[place]. [facility]  Patient:    Debra [name], Debra [name]                        MRN: [id number] Adm. Date:  [id number] Disc. Date: 05/04/99 Attending:  Doug Sou Dictator:   Kinnie Scales. Reed Breech, M.D.                           Discharge Summary  DATE OF BIRTH: 1965/04/12  DISCHARGE DIAGNOSES: 1. Hyperosmolar nonketotic hyperglycemia. 2. Bloody diarrhea. 3. Status post polypectomy. 4. Status post colonoscopy. 5. Anemia. 6. Elevated transaminases. 7. Hypertension. 8. Cholelithiasis.  DISCHARGE MEDICATIONS:  Feosol 220 mg/5 ml 1 t t.i.d.  Serevent 2 puffs b.i.d. Azmacort 2 puffs b.i.d.  Glucagon injection used for hyperglycemia p.r.n. Protonix 40 mg 1 p.o. q day. Norvasc 10 mg 1 p.o. q day.  Vasotec 5 mg 1 p.o. q day.  Resume migraine medications; Midrin, Pamelor and Neurontin.  Tegretol 100 mg 1 p.o. t.i.d.  Insulin dosing; 20 units NPH and 3 units Humalog sliding scale at breakfast, lunch 3 units Humalog plus 1/2 sliding scale insulin, supper 3 units Humalog plus sliding scale insulin, and bedtime 20 units NPH plus half sliding scale insulin.  CONSULTATIONS:  Hedwig Morton. Juanda Chance, M.D., Gastroenterology.  OPERATIONS/PROCEDURES: 1. Colonoscopy - polyp with blood clot noted and polypectomy performed.    No active bleeding noted. The rest of the colon was normal. 2. Abdominal ultrasound - showing hepatomegaly and diffuse fatty infiltration,    and tiny gallstones with no evidence of biliary dilation.  HOSPITAL FOLLOW-UP:  The patient will follow up with me at [facility].  She will need to be set up with an endocrinologist.  The patient will also [facility].  ADMISSION HISTORY AND PHYSICAL:  This 46 year old African-American female presents with a two-day history of diarrhea, which changed to bloody on the day of admission.  The patient also has had elevated blood sugars throughout the  weekend prior to admission.  She reports checking her blood sugar the night prior to admission and it was in the 500s.  At that time she fell asleep only to be awakened by her diarrhea of which she had copious amounts overnight.  Bright red blood per rectum was noted in stool and running down the back of her leg.  The patient denies any hemorrhoids.  She does report undergoing colonoscopy three years ago in which she was found to have several polyps, which were removed.  There was no active disease at that time.  The patient also reports on the day of admission she checked blood sugar on her Glucometer read it to be greater than 600.   The patient gave herself more insulin and came to the Emergency Room.  The patient denies any preceding fevers, cough or vomiting; however, admits to some diffuse abdominal pain and reports bloating.  The patient denies any vaginal bleeding.  PAST MEDICAL HISTORY: The patient has a history of diabetes mellitus, insulin-requiring for approximately 13 years.  Hypertension.  Asthma. Migraines.  PAST SURGICAL HISTORY: The patient is status post cesarean section.  Breast biopsy 13 years ago, which was benign.  Colonoscopy in 1999, which showed three polyps.  The patient states her primary medical doctor moved away, so she no longer has anyone to follow her hyperglycemia.  PHYSICAL EXAMINATION:  LYMPHATICS:  Physical examination is  significant for some anterior lymphadenopathy in the cervical chain.  ABDOMEN:  Decreased liver edge, 2-3 cm below the costal margin; however, this is nontender. There is no splenomegaly.  Positive bowel sounds are noted. There is no guarding.  RECTAL:  Rectal examination is Heme negative.  There are no fissures noted. Patient has normal tone.  No external hemorrhoids or internal hemorrhoids are noted.  LABORATORY AND ANCILLARY DATA: Sodium 126, potassium 4.5, chloride 92, bicarbonate 20, BUN 15, creatinine 1.1, and glucose  725.  AST 82 and ALT elevated at 68.  Alkaline phosphatase elevated at 163, total bilirubin 1.8, total protein 7.8, and albumin 3.3.  Urinalysis showed greater than 1,000 glucose, ketones greater than 80, 0-5 white blood cells, and 0-5 red blood cells.  Hemoglobin 12.7, white blood cells 7.3 and normal platelets.  HOSPITAL COURSE: The patient was admitted for evaluation of hyperglycemia and diarrhea.  #1 - HYPERGLYCEMIA:  The patient did have an elevated serum osmolality.  She was given insulin while in the Emergency Room and started on an insulin drip on a step-down bed.  The patient was also administered several liters of fluid until her blood sugars normalized.  At that time her fluids were change to D5 half Normal Saline with KCl.  On day two of hospitalization blood sugars were in the 140s and the patient was switched to p.o. and subcutaneous insulin. She tolerated this well.  The Diabetes Nutrition coordinator stopped by and gave recommendations for better insulin dosing.  #2 - GASTROINTESTINAL:  Gastroenterology Service was consulted, Dr. Juanda Chance. The patient underwent colonoscopy, which showed a polyp with a blood clot, but no active bleeding was noted.  Polypectomy was performed.  The remainder of the colon was examined and was benign.  Recommendation was for follow up colonoscopy in three to five years.  Abdominal ultrasound showed cholelithiasis and a somewhat fatty liver.  It was also recommended if the patient again became symptomatic to perform a HIDA scan in two to three weeks. The patients transaminases did improve while hospitalized, except for the SGOT, which was 63, SGPT was 47, total protein was 6.0, albumin was 2.5, total bilirubin was 0.4, and alkaline phosphatase was 122.  There was good improvement.  #3 - HYPERTENSION:  The patient was continued on her ACE inhibitor and calcium channel blocker while hospitalized.  Blood pressure remained within the normal  range.  #4 - EDUCATION:  Diabetes coordinator spoke with the patient regarding her  diabetes.  The patient will follow up with myself as well as an endocrinologist as an outpatient.  The patients hemoglobin A-1-C was 9.6.  DISCHARGE PHYSICAL EXAMINATION:  The patient is afebrile, ambulating and tolerating p.o. well on an increased insulin regimen.  The patient will follow up as an outpatient with myself and an endocrinologist as well as GI and Nutrition.  The patients diarrhea has stabilized and has not had any loose bowel movements since admission.  The patient was deemed stable by the team and will be discharged today to home. DD:  05/04/99 TD:  05/04/99 Job: [id number]

## 2010-07-15 NOTE — Assessment & Plan Note (Signed)
[facility]                         GASTROENTEROLOGY OFFICE NOTE   NORIE, LATENDRESSE                        MRN:          [id number]  DATE:04/10/2006                            DOB:          [birth year]    Miss Plummer is a 45 year old African American female with a history of  colon polyps found on colonoscopy March 2001. Last colonoscopy September  2005, did not show any evidence of recurrent polyps but she had an anal  fissure. She now comes with 1 week history of rectal bleeding occurring  on toilet tissue with some occasional rectal pain. Her bowel habits are  somewhat constipated, having bowel movement every 2 to 3 days. She  denies any abdominal pain. On last anoscopy 3 years ago she had 1+  hemorrhoids. Patient is a diabetic on a insulin pump. She has been  treated for asthmatic bronchitis and high blood pressure.   MEDICATIONS:  1. Humalog pump.  2. Azmacort Inhaler.  3. Flovent inhaler.  4. Clonidine b.i.d.  5. Nexium 40 p.o. q.d.  6. Clinidine 0.3 mg b.i.d.  7. Tekturna 30 mg p.o. q.d.  8. Aspirin 325 mg p.o. q.d.  9. Ultram p.r.n.  10.Restasis drops.   PHYSICAL EXAMINATION:  Blood pressure 152/102, pulse 88, weight 189  pounds.  She was alert, oriented, no distress.  LUNGS: Clear to auscultation.  COR: Normal precordium and normal S1, normal S2.  ABDOMEN: Protuberant, soft with insulin pump at left upper quadrant.  Bowel sounds were normoactive. There was no tenderness, no palpable  mass.  No CVA tenderness.  RECTAL AND ANOSCOPIC EXAM: Shows normal perianal area, rectal tone was  normal. Rectal ampulla showed hard stool, which was Hemoccult negative.  There were small internal hemorrhoids which did not prolapse. There were  at least 2 pinpoint areas at the dentate line which showed some denuded  mucosa of superficial fissures, but no definite active bleeding. These  could possibly be sources of the bleeding.   IMPRESSION:  48. A  45 year old [place] American female with rectal bleeding which      suggests anal etiology but I can not reproduce her bleeding today      and I can not be convinced that this is the definite site of the      bleeding. She has history of anal fissure and hemorrhoids.  2. Constipation.  3. Personal history of colon polyps.   PLAN:  1. Analpram cream 0.5% to use 2 to 3 times a day.  2. Colonoscopy scheduled.  3. Use stool softener on daily basis, Colace 100 mg q.d. Depending on      the finding of the colonoscopy we may put her on more powerful      laxatives.     Hedwig Morton. Juanda Chance, MD  Electronically Signed    DMB/MedQ  DD: 04/10/2006  DT: 04/10/2006  Job #: [id number]   cc:   Tanya D. Daphine Deutscher, M.D.

## 2010-07-15 NOTE — Procedures (Signed)
[place]. [facility]  Patient:    Debra [name], Debra [name]                        MRN: [id number] Adm. Date:  [id number] Attending:  Farley Ly CC:         Farley Ly, M.D.                           Procedure Report  PROCEDURE:  Flexible colonoscopy.  ENDOSCOPIST:  Hedwig Morton. Juanda Chance, M.D.  INDICATIONS:  This 45 year old black female was admitted with nonketotic diabetic acidosis preceded by profuse diarrhea and a large amount of rectal bleeding. She has no previous GI history.  Yesterday, on physical exam, she was Hemoccult-negative.  She was tender in the left lower quadrant and across the lower abdomen; she also has mild elevation of liver function tests.  Patient is undergoing flexible sigmoidoscopy/colonoscopy to evaluate her rectal bleeding.  There is no family history of colon cancer.  ENDOSCOPE:  Fujinon single-channel videoscope.  SEDATION:  Versed 5 mg IV, Demerol 50 mg IV.  FINDINGS:  Fujinon single-channel videoscope was passed under direct vision into the rectum to the sigmoid colon.  Patient was monitored by pulse oximeter; her oxygen saturation remained normal.  Her prep was excellent.  Anal canal showed o evidence of hemorrhoids.  Retroflexion of the endoscope in the rectum showed normal rectal ampulla without hemorrhoids.  At the level of 10 to 15 cm from the rectum was a 1-cm pedunculated polyp which was on a short stalk and showed stigmata of  recent bleeding.  It had a blood clot adherent to the tip of the polyp. Sigmoid colon mucosa was edematous with some decrease in the vascular pattern of the left colon.  The descending colon, splenic flexure and transverse colon were normal.  Because of the finding of a polyp, the flexible sigmoidoscopy was turned into a  full colonoscopy.  Since her prep was excellent, we were able to traverse into he hepatic flexure, ascending colon and into the cecum without difficulty,  with full visualization of the cecal pouch and ileocecal valve.  Appendiceal opening was noted.  There were no polyps in the right colon.  Colonoscope was slowly retracted. Mucosa appeared normal through the right and transverse colon.  Again, in the sigmoid colon, there was some edema of the mucosa with the post-polypectomy site which appeared to have complete hemostasis.  Specimen was sent to pathology.  IMPRESSION: 1. Left colon polyp, status post polypectomy. 2. Status post rectal bleeding secondary to polyp.  PLAN:  Patients bleeding was most likely related to the polyp because it showed  some stigmata of bleeding.  Suggest to keep patient off aspirin and NSAIDs for ow. Will repeat colonoscopy in three to five years, depending on the histology of the polyp. DD:  05/03/99 TD:  05/04/99 Job: [id number]

## 2010-07-15 NOTE — Op Note (Signed)
[place]. [facility]  Patient:    Debra [name], Debra [name] Visit Number: [id number] MRN: [id number]          Service Type: DSU Location: [phone] Attending Physician:  Sonda Primes Dictated by:   Mardene Celeste. Lurene Shadow, M.D. Proc. Date: 03/29/01 Admit Date:  03/29/2001                             Operative Report  PREOPERATIVE DIAGNOSIS:  Calculus cholecystitis.  POSTOPERATIVE DIAGNOSIS:  Calculus cholecystitis.  OPERATION PERFORMED:  Laparoscopic cholecystectomy and intraoperative cholangiogram.  SURGEON:  Luisa Hart L. Lurene Shadow, M.D.  ASSISTANT:  Marnee Spring. Wiliam Ke, M.D.  ANESTHESIA:  General.  INDICATIONS FOR PROCEDURE:  The patient is a 45 year old woman presenting with chronic recurring abdominal pain associated with nausea and vomiting.  On ultrasound she was noted to have a solitary gallstone.  She is now brought to the operating room after the risks and benefits of laparoscopic cholecystectomy have been fully discussed and she gives consent to surgery.  DESCRIPTION OF PROCEDURE:  Following the induction of satisfactory anesthesia with the patient positioned supinely, the abdomen was prepped and draped to be included in a sterile operative field.  Open laparoscopy at the umbilicus was carried out with insertion of a Hasson cannula and insufflation of the peritoneal cavity to 14 mmHg.  The camera was inserted and visual exploration of the abdomen carried out.  The liver edge is sharp.  The liver surface is smooth.  The gallbladder was multiple adhesions and scarring from the duodenum.  None of the small or large intestine viewed appeared to be abnormal.  Under direct vision, epigastric and lateral ports were placed.  The gallbladder was grasped and retracted cephalad.  Dissection was carried down to the ampulla with isolation of the cystic artery and cystic duct positively identified by tracing the cystic artery to the gallbladder wall and the  cystic duct to the gallbladder cystic duct junction.  The cystic artery was doubly clipped and transected.  The cystic duct was clipped proximally and opened. The cystic duct cholangiogram carried out by inserting a Reddick catheter into the abdomen was carried out.  I injected one half strength Hypaque into the biliary system.  The resulting cholangiogram showed prompt flow to the duodenum, no filling defects.  All biliary radicals were normal caliber.  The catheter was then withdrawn, the cystic duct doubly clipped and transected and the gallbladder then dissected free from the liver bed using electrocautery. All areas of dissection checked for hemostasis.  Additional bleeding points treated with electrocautery.  Camera moved to the epigastric port and the gallbladder retrieved through the umbilical port without difficulty.  The trocars were then removed under direct vision.  Sponge, instrument and sharp counts were verified.  The wounds closed in layers as follows.  Umbilical wound in two layers with 0 Dexon and 4-0 Dexon.  Epigastric and lateral flank wounds closed with 4-0 Dexon sutures.  All wounds reinforced with Steri-Strips and sterile dressings applied.  Anesthetic reversed.  Patient removed from the operating room to the recovery room in stable condition having tolerated the procedure well. Dictated by:   Mardene Celeste. Lurene Shadow, M.D. Attending Physician:  Sonda Primes DD:  03/29/01 TD:  03/29/01 Job: [id number]

## 2010-07-15 NOTE — H&P (Signed)
Debra [name], Debra [name]                 ACCOUNT NO.:  [id number]   MEDICAL RECORD NO.:  [id number]          PATIENT TYPE:  INP   LOCATION:  1824                         FACILITY:  [facility]   PHYSICIAN:  Corinna L. Lendell Caprice, MDDATE OF BIRTH:  [birth year]   DATE OF ADMISSION:  07/19/2005  DATE OF DISCHARGE:                                HISTORY & PHYSICAL   CHIEF COMPLAINT:  Chest pain.   HISTORY OF PRESENT ILLNESS:  Debra [name] is a 45 year old black female with  type 1 diabetes as well as hypertension who is unassigned and presents to  the emergency room with substernal chest pain. Initially it felt sharp and  then it felt like pressure on Debra chest. She had some shortness of breath  with it as well as some nausea.  She was sitting down when it occurred and  lasted about 40 minutes and then resolved on its own. Currently she is chest  pain free. She reports having had this experience in the past and had an  echocardiogram done.  We do not have these results.  She also has been  having several hypoglycemic episodes over the past few days.  She is on an  insulin pump.   PAST MEDICAL HISTORY:  1.  Type 1 diabetes.  2.  Hypertension:  The patient reports that this is usually not well      controlled despite multiple medications..  3.  Diabetic retinopathy.  4.  Peripheral neuropathy.  5.  Asthma.   MEDICATIONS:  1.  Insulin pump.  2.  Clonidine 0.6 mg p.o. b.i.d.  3.  Norvasc 10 mg a day.  4.  Altace 10 mg a day.  5.  Diovan HCT 320 mg/25 mg a day.  6.  Coreg 12-1/2 mg p.o. b.i.d.  7.  Aspirin 325 mg p.o. daily.  8.  Flovent and albuterol.   PAST SURGICAL HISTORY:  She has had bilateral hand surgery for trigger  finger.  She has had a cholecystectomy, several foot surgeries, a C-section,  laser surgeries on Debra retina.   ALLERGIES:  She has no known drug allergies.   SOCIAL HISTORY:  She his unemployed. She does not drink, smoke or use drugs.   FAMILY HISTORY:  Debra [name] has  hypertension and end-stage renal disease.  Debra sister has breast cancer.   PHYSICAL EXAMINATION:  VITAL SIGNS:  Debra temperature 38.1. Initially Debra  blood pressure was 221/110, currently after getting 0.2 mg of clonidine it  is 187/103, pulse initially 100 and currently 87, respiratory rate 20,  oxygen saturation 100% on room air.  GENERAL:  The patient is well-nourished, well-developed in no acute  distress.  HEENT: Normocephalic, atraumatic.  Pupils equal, round, reactive  to light.  Moist mucous membranes.  Sclerae nonicteric.  NECK:  Neck is supple.  No lymphadenopathy.  No JVD.  LUNGS:  Lungs are clear to auscultation bilaterally without wheezes, rhonchi  or rales.  CARDIOVASCULAR:  Regular rate and rhythm with a 2/6 systolic murmur.  ABDOMEN:  Soft, nontender, nondistended.  GENITOURINARY:  GU and rectal were deferred.  EXTREMITIES:  No clubbing or cyanosis.  1+ pitting edema bilaterally.  NEUROLOGIC:  Alert and oriented.  Cranial nerves and sensorimotor exam are  intact.  PSYCHIATRIC:  Normal affect.   LABS:  CBC unremarkable.  Creatinine is 0.8.  Two point care enzymes are  negative.  EKG shows normal sinus rhythm.  CHEST X-RAY:  Negative.   ASSESSMENT/PLAN:  1.  Chest pain:  Given the patient's risk factors I will admit Debra to the      hospital, we will need to consult cardiology in the morning. She has no      chest pain currently.  We will rule out MI.  I will also check a D-      dimer.  2.  Type 1 diabetes with recurrent hypoglycemia today. She has turned Debra      pump off since about 16 hours ago and still has had some hypoglycemic      episodes.  I will give Debra sliding scale insulin for now. She will be      n.p.o. for possible stress test.  3.  Uncontrolled hypertension:  I suspect this is a chronic issue for Debra.      She had a recent MRA of the renal arteries which was negative for      stenosis. For now I will hold Debra beta blocker, calcium channel blockers       and give Debra other medications as well as p.r.n. IV hydralazine.  4.  Stable asthma.  5.  History of diabetic retinopathy and peripheral neuropathy.      Corinna L. Lendell Caprice, MD  Electronically Signed     CLS/MEDQ  D:  07/20/2005  T:  07/20/2005  Job:  [id number]

## 2010-07-15 NOTE — Op Note (Signed)
Debra [name], Debra [name]                 ACCOUNT NO.:  [id number]   MEDICAL RECORD NO.:  [id number]          PATIENT TYPE:  OIB   LOCATION:  2550                         FACILITY:  [facility]   PHYSICIAN:  Beulah Gandy. Ashley Royalty, M.D. DATE OF BIRTH:  12/17/1965   DATE OF PROCEDURE:  05/22/2006  DATE OF DISCHARGE:                               OPERATIVE REPORT   ADMISSION DIAGNOSIS:  Traction retinal detachment, proliferative  diabetic retinopathy, retinal fibrosis, vitreous hemorrhage, left eye.   PROCEDURES:  Pars plana vitrectomy, membrane peel left eye, retinal  photocoagulation left eye, gas injection left eye.   SURGEON:  Alan Mulder, M.D.   ASSISTANT:  Bryan Lemma. Lundquist, P.A.   ANESTHESIA:  General.   DETAILS:  Usual prep and drape, 25 gauge trocars placed at 10, 2 and 4  o'clock.  Infusion port anchored into place at 4 o'clock.  Provisc  placed on the corneal surface.  Biome viewing system moved into place.  Pars plana vitrectomy begun just behind the cataractous lens.  Blood and  vitreous were encountered.  These were carefully removed under low  suction and rapid cutting.  The vitrectomy was carried down to the  macular surface where the lighted pick and the cutter were used to  engage posterior membranes and areas of neovascularization and traction.  These areas were engaged and trimmed down to the vitreous stumps.  The  membrane was peeled across the macular region.  The membranes were  peeled out to the equator.  Subhyaloid blood was encountered and  removed.  The vitreous base was trimmed for 360 degrees.  The endolaser  was positioned in the eye, 1025 burns placed around the retinal  periphery.  The power was 1000 milliwatts, 1000 microns each and 0.1  seconds each.  Vitreous washout procedure was performed.  A 30% gas  fluid exchange was performed.  The instruments removed from the eye and  25 gauge trocars were removed from the eye.  The wounds were tested and  found to be  tight.  Polymyxin and gentamicin were irrigated into Tenon's  space, Decadron 10 mg was injected to the lower subconjunctival space.  Marcaine was injected around the globe for postop pain.  TobraDex  ophthalmic ointment, patch and shield were placed.  Closing pressure was  10 with a Barraquer tonometer.  Complications none.  Duration 30  minutes.  The patient was awakened and taken to recovery in satisfactory  condition.      Beulah Gandy. Ashley Royalty, M.D.  Electronically Signed     JDM/MEDQ  D:  05/22/2006  T:  05/22/2006  Job:  [id number]

## 2010-07-15 NOTE — Procedures (Signed)
NAMEFOY, MUNGIA                 ACCOUNT NO.:  [id number]   MEDICAL RECORD NO.:  [id number]          PATIENT TYPE:  OUT   LOCATION:  SLEEP CENTER                 [facility]ACILITY:  [facility]   PHYSICIAN:  [name] Share, MD,FCCPDATE OF BIRTH:  06-04-1965   DATE OF STUDY:  02/04/2007                          MULTIPLE SLEEP LATENCY TEST   REFERRING PHYSICIAN:  Evie Lacks, MD   INDICATION FOR STUDY:  780.59   EPWORTH SLEEPINESS SCORE:  16   BMI:   MEDICATIONS:   NAP 1:  Sleep onset latency 9 minutes.  REM latency NA.   NAP 2:  Sleep onset latency 7.5 minutes.  REM latency NA.   NAP 3:  Sleep onset latency 5 minutes.  REM latency NA.   NAP 4:  Sleep onset latency 17 minutes.  REM latency NA.   NAP 5:  Sleep onset latency 19 minutes.  REM latency NA.    MEAN SLEEP LATENCY:  11.5 minutes.   NUMBER OF REM EPISODES:  Zero.   COMMENTS:  The patient had a fairly normal nocturnal polysomnogram the  prior night except for large numbers of leg jerks which did not result  in significant arousal or awakening.   IMPRESSIONS-RECOMMENDATIONS:  Very little objective daytime sleepiness  noted with a mean sleep latency of 11.5 minutes and no sleep onset REM's  noted.  This study is not consistent with a diagnosis of narcolepsy.      [name] Share, MD,FCCP  Diplomate, American Board of Sleep  Medicine  Electronically Signed     [facility]/MEDQ  D:  03/14/2007 13:51:53  T:  03/14/2007 15:26:19  Job:  [id number]

## 2010-07-15 NOTE — Discharge Summary (Signed)
[place]  Patient:    Debra [name], Debra [name]                        MRN: [id number] Adm. Date:  [id number] Disc. Date: 03/23/00 Attending:  McDiarmid, Leighton Roach. Dictator:   Michell Heinrich, M.D. CC:         Cheree Ditto, M.D.   Discharge Summary  ADMISSION DIAGNOSES: 1. Diabetic ketoacidosis. 2. Type 1 diabetes.  DISCHARGE DIAGNOSES: 1. Diabetic ketoacidosis. 2. Type 1 diabetes.  CHRONIC PROBLEM LIST: 1. Type 1 diabetes. 2. History of multiple diabetic ketoacidosis admissions. 3. Hypertension. 4. Asthma. 5. Migraine headaches. 6. Cholelithiasis. 7. History of elevated liver function tests. 8. History of iron deficiency anemia. 9. History of colonoscopy with polypectomy.  DISCHARGE MEDICATIONS: 1. Serevent MDI two puffs b.i.d. 2. Norvasc 10 mg one tablet by mouth daily. 3. Vasotec 5 mg one tablet by mouth daily. 4. Midrin 65 mg two tablets by mouth and then one tablet per hour until relief    of headache, maximum five capsules per 12 hours. 5. Humulin NPH 25 units q.a.m. and 15 units q.p.m. 6. Azmacort MDI two puffs b.i.d. 7. Regular Insulin sliding scale.  Check glucose q.a.c.  If glucose 150-200,    give 2 units; if glucose 201-250, give 4 units; if glucose 251-300, give    6 units; if glucose 301-350, given 8 units; if glucose 351-400, give    10 units.  CONSULTS:  None.  PROCEDURES:  None.  HISTORY OF PRESENT ILLNESS:  For complete history and physical, please see the admission H&P in the chart.  Briefly, this is a 45 year old African-American female with type 1 diabetes and a history of multiple admissions for diabetic ketoacidosis.  She presented with polyuria, polydipsia, polyphagia, and watery diarrhea for one day.  She had recently ran out of her medications approximately three to four weeks prior to this presentation secondary to loss of her job and no financial support.  PAST MEDICAL HISTORY:  As noted in the chronic  problem list.  REVIEW OF SYSTEMS:  Positive for some shortness of breath for the last day and was otherwise negative.  PHYSICAL EXAMINATION AT PRESENTATION:  Tachycardia, normal blood pressure, afebrile, O2 saturations 100% on room air.  HEART:  Tachycardia with a 2/6 systolic murmur heard best at the left upper sternal border.  PULMONARY:  Exam was unremarkable.  ABDOMEN:  Exam was unremarkable.  PERTINENT LABORATORY DATA AT PRESENTATION:  White blood cell count 10.5, hemoglobin 13.6, platelets 444,000.  The room air ABG showed a pH of 7.14, a pCO2 of 22, a bicarbonate of 8, and an anion gap of 21.  The basic metabolic panel showed sodium 131, potassium 4.9, chloride 102, BUN 15, creatinine 0.5, glucose 654, and bicarbonate 8.  The chest x-ray showed no acute disease.  HOSPITAL COURSE:  This 45 year old African-American female was admitted to the hospital with diabetic ketoacidosis.  #1 - DIABETIC KETOACIDOSIS:  The patient was admitted to the step-down unit and placed on an insulin drip and given IV hydration.  She responded well to this therapy and when her blood sugar was less than 250, her fluid was changed to D5 with normal saline and her insulin drip was discontinued.  She was begun on subcutaneous NPH.  Her anion gap normalized to 6 within several hours.  She did not have any abdominal pain or nausea during this hospitalization.  She was able to begin a regular  diet the morning after admission and ate breakfast and lunch without problem.  Her IV fluids were discontinued.  Her glucose on discharge was 262 on her last basic metabolic panel.  Her bicarbonate was 20. Of note, her sodium was 129 on her last basic metabolic panel and this may need to be rechecked as an outpatient.  It is believed that this episode of DKA was secondary to non-adherence to her insulin regimen which was because of no money to buy her insulin.  She did have a chest x-ray which showed no signs of  infection and a urinalysis which showed no sign of infection.  The urine drug screen was negative.  The urine pregnancy test was negative.  The hemoglobin A1C was 12.3.  #2 - MEDICATION NONCOMPLIANCE:  The patient did not appear to be overly distressed with her medication problem.  However, she in dire need of help with getting financial support to obtain her medications.  We contacted case management in order to help with this.  She was given phone numbers of people to help with a medication assistance program and she was given extra medications at discharge to last her for one month until she would get help. She did also get an appointment to fill out a Medicaid application form.  DISPOSITION:  The patient was discharged home in improved condition on the previously mentioned discharge medications.  She was advised to drink lots of fluids and to keep her appointments with financial assistance advisors.  She had a follow-up appointment arranged with Onalee Hua L. Reed Breech, M.D., at [facility] for March 27, 2000, at 2:30 p.m. DD:  03/23/00 TD:  03/23/00 Job: [id number]

## 2010-07-15 NOTE — Cardiovascular Report (Signed)
NAMECELINE, DISHMAN NO.:  [id number]   MEDICAL RECORD NO.:  [id number]          PATIENT TYPE:  INP   LOCATION:  3707                         FACILITY:  [facility]   PHYSICIAN:  Nicki Guadalajara, M.D.     DATE OF BIRTH:  [birth year]   DATE OF PROCEDURE:  07/21/2005  DATE OF DISCHARGE:  07/21/2005                              CARDIAC CATHETERIZATION   INDICATIONS:  Debra [name] is a 45 year old African-American female who  has a long-standing history of type 1 diabetes mellitus, history of poorly  controlled hypertension, who recently has noticed episodes of chest pressure  and tightness, particularly with exertion and walking.  She has diabetic  retinopathy and peripheral neuropathy as complications to her diabetes  mellitus.  She had been scheduled for a perfusion study.  This showed  relatively normal perfusion although thinning was noted anteroseptal without  distally significant ischemia.  However, the patient's symptom complex is  worrisome in light of her long-standing type 1 diabetes mellitus and  significant hypertension that definitive cardiac catheterization was  ultimately recommended.   PROCEDURE:  After premedication with Versed 2 mg intravenously, the patient  was prepped and draped in the usual fashion.  The right femoral artery was  punctured anteriorly and a 5-French sheath was inserted.  Diagnostic  catheterization was done with 5-French Judkins left and right coronary  catheters.  A 5-French pigtail catheter was used for biplane left  ventriculography.  The pigtail catheter was used for distal aortography and  to evaluate her renal arteries to make certain there is no renovascular  etiology to her significant hypertension.  She tolerated the procedure well.  Hemostasis was obtained by direct manual pressure.   HEMODYNAMIC DATA:  Central aortic pressure was 205/98.  Left ventricular  pressures is 205/14.   ANGIOGRAPHIC DATA:  Left main coronary was  an angiographically normal short  vessel which bifurcated into an LAD and left circumflex system.   The LAD gave rise to several diagonal branches and several septal  perforating arteries.  The LAD wrapped around the apex and was  angiographically normal.   The circumflex vessel gave rise to two marginal vessels and was  angiographically normal.   The right coronary was angiographically normal and gave rise to a PDA system  as well as a marginal branch.   Biplane left ventriculography revealed normal contractility and there was a  suggestion of left ventricular hypertrophy concentrically.  There were no  focal segmental wall motion abnormalities.   Distal aortography revealed a smooth abdominal infrarenal aorta and iliac  system.  Renal arteries were widely patent without evidence for renal  stenosis or fibromuscular dysplasia.   IMPRESSION:  1.  Concentric left ventricular hypertrophy with normal left ventricular      systolic function.  2.  Normal coronary arteries.  3.  No evidence for renal artery stenosis.   The patient did receive labetalol 20 mg at the completion of the study prior  to pulling the sheath.  Medications will be adjusted for maximal  hypertension control in this type 1 diabetic female.  ______________________________  Nicki Guadalajara, M.D.     TK/MEDQ  D:  07/21/2005  T:  07/22/2005  Job:  [id number]   cc:   Hollice Espy, M.D.   Corinna L. Lendell Caprice, MD

## 2010-07-26 ENCOUNTER — Encounter: Payer: Self-pay | Admitting: Internal Medicine

## 2010-07-26 ENCOUNTER — Ambulatory Visit (INDEPENDENT_AMBULATORY_CARE_PROVIDER_SITE_OTHER): Payer: Self-pay | Admitting: Internal Medicine

## 2010-07-26 VITALS — BP 189/97 | HR 84 | Temp 97.6°F | Ht 64.5 in | Wt 192.1 lb

## 2010-07-26 DIAGNOSIS — I10 Essential (primary) hypertension: Secondary | ICD-10-CM

## 2010-07-26 DIAGNOSIS — E109 Type 1 diabetes mellitus without complications: Secondary | ICD-10-CM

## 2010-07-26 DIAGNOSIS — E119 Type 2 diabetes mellitus without complications: Secondary | ICD-10-CM

## 2010-07-26 LAB — GLUCOSE, CAPILLARY: Glucose-Capillary: 206 mg/dL — ABNORMAL HIGH (ref 70–99)

## 2010-07-26 LAB — LIPID PANEL
HDL: 45 mg/dL (ref >39)
LDL Cholesterol: 115 mg/dL — ABNORMAL HIGH (ref 0–99)
Triglycerides: 112 mg/dL (ref <150)

## 2010-07-26 NOTE — Assessment & Plan Note (Signed)
Mean CBG 280 per meter download. I will try to adjust the dose of insulin during the next visit, she already has seen Jamison Neighbor during the first part of this month and if needed we'll set another appointment with her. She has been hard to manage for diabetes and hypertension both. Her last diabetic eye exam was about 2 years before and so we'll set an appointment for her today.

## 2010-07-26 NOTE — Assessment & Plan Note (Signed)
Blood pressure 189/97. Still high, did not get her spironolactone prescription filled. I will fax the form to medication assistance program with required detail today so she can get the prescription filled. I will see her back in 2 weeks to recheck her blood pressure.

## 2010-07-26 NOTE — Progress Notes (Signed)
  Subjective:    Patient ID: Debra [name], female    DOB: 1965/03/24, 45 y.o.   MRN: [id number]  HPI Ms. 68 is a 45 year old woman with past medical history for uncontrolled hypertension, DM 1 to the clinic for a followup visit. Blood pressure today is 189/97. She was supposed to get Spironolactone prescription filled from medication assistance program but did not happen because of some paperwork laking from my side. She complains of some chest tightness for last 3-4 days and nausea and says that this happens intermittently when her blood pressure is high but is not new for her. Denies any shortness of breath, diaphoresis, pain/tightness moving anywhere else. Endorses taking all medications regularly. Her average CBGs is 280 per meter download today.    Review of Systems    as per history of present illness Objective:   Physical Exam    Constitutional: Vital signs reviewed.  Patient is a well-developed and well-nourished in no acute distress and cooperative with exam. Alert and oriented x3.  Head: Normocephalic and atraumatic Mouth: no erythema or exudates, MMM Eyes: PERRL, EOMI, conjunctivae normal, No scleral icterus.  Neck: Supple, Trachea midline normal ROM, No JVD, mass, thyromegaly, or carotid bruit present.  Cardiovascular: RRR, S1 normal, S2 normal, no MRG, pulses symmetric and intact bilaterally Pulmonary/Chest: CTAB, no wheezes, rales, or rhonchi Abdominal: Soft. Non-tender, non-distended, bowel sounds are normal, no masses, organomegaly, or guarding present.  Musculoskeletal: No joint deformities, erythema, or stiffness, ROM full and no nontender  Neurological: A&O x3, Strenght is normal and symmetric bilaterally, cranial nerve II-XII are grossly intact, no focal motor deficit, sensory intact to light touch bilaterally.  Skin: Warm, dry and intact. No rash, cyanosis, or clubbing.       Assessment & Plan:

## 2010-07-26 NOTE — Patient Instructions (Addendum)
Please make a f/u appointment in 2-3 weeks with Dr. Allena Katz. Also fill up the prescription of Spironolactone today as I will call the pharmacy and straighten it up.

## 2010-07-29 ENCOUNTER — Encounter: Payer: Self-pay | Admitting: Family Medicine

## 2010-07-29 ENCOUNTER — Ambulatory Visit (INDEPENDENT_AMBULATORY_CARE_PROVIDER_SITE_OTHER): Payer: Self-pay | Admitting: Family Medicine

## 2010-07-29 VITALS — BP 169/100 | Ht 64.0 in | Wt 193.0 lb

## 2010-07-29 DIAGNOSIS — M722 Plantar fascial fibromatosis: Secondary | ICD-10-CM

## 2010-07-29 DIAGNOSIS — M79673 Pain in unspecified foot: Secondary | ICD-10-CM | POA: Insufficient documentation

## 2010-07-29 DIAGNOSIS — M79609 Pain in unspecified limb: Secondary | ICD-10-CM

## 2010-07-29 DIAGNOSIS — G8929 Other chronic pain: Secondary | ICD-10-CM | POA: Insufficient documentation

## 2010-07-29 DIAGNOSIS — M6530 Trigger finger, unspecified finger: Secondary | ICD-10-CM

## 2010-07-29 NOTE — Progress Notes (Signed)
Subjective:    Patient ID: Debra [name], female    DOB: 1965-08-06, 45 y.o.   MRN: [id number]  HPI 45 year old female history of type 1 diabetes referred by Dr. Allena Katz at the internal medicine clinic for evaluation of hand and foot pain. Patient has had at least 3 trigger fingers requiring surgical release by Dr. Mina Marble at the hand Center in the past as well as a left DeQuervain's release. She states she has had a right fourth trigger finger and a left third trigger finger that has been bothering her more so the past couple years. It is more painful and locking more often than she would like to have it evaluated and managed.  In addition chest and chronic bilateral foot pain. She has a history of a first toe and second toe fusion on the right side, and she thinks she's had this on the left side as well. She is getting some chronic skin irritation on the medial aspects of both second toes. She is also having pain in the bilateral first and TP joints. Patient has a history of bilateral heel pain, and states she's had one of her plantar fascia injected in the past. She's currently only doing a ice water bottle roll.    Review of Systems Denies fever, sweats, chills, weight loss    Objective:   Physical Exam Gen. appearance: Well-appearing female in no distress Psych: Normal affect Neuro: Alert and oriented Right hand: Positive painful nodule palpated at the right fourth A1 pulley. In triggering is reproducible. Left hand: Positive painful nodule of the left third A1 pulley with reproducible triggering Right foot: Healed scar along the medial aspect of the first and TP joint. She has no range of motion of the first and second toes. She has some mild to moderate callus formation on the medial aspect of the second toe. Fairly significant tenderness at the first MTP joint. Left foot no obvious surgical scar, but she does have decreased joint range of motion of the first and second toes at the IP  joint and MTP joints. Just a little callous in the medial aspect of the second toe.There is significant tenderness at the first MTP joint. Bilateral feet: Mild bilateral flexible pes planus. Her gait shows some mild overpronation. She has tenderness at the plantar fascia origin bilaterally, with some decreased dorsiflexion range of motion associated with this.       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 -Temporary green orthotics with bilateral first ray post and bilateral scaphoid pads -Followup in 3-4 weeks, at that point we can consider making custom orthotics -Also gave her specific plantar fascia stretching and icing protocol  Consent obtained and verified. Sterile betadine prep. Furthur cleansed with alcohol. Topical analgesic spray: Ethyl chloride. Joint: Rt 3rd and Lt 4th trigger fingers Approached in typical fashion with: directly into nodules Completed without difficulty Meds: 0.4 cc kenalog 10 mg and 0.4 cc lidocaine 1% for each nodule Needle: 21 G 1.5 inch (initially tried with TB syringe, but popped off, so we switched to lure lock syringe with larger gauge needle) Aftercare instructions and Red flags advised.  #3 hypertension -Her present blood pressure was elevated today at 169/100, but she states she has not taken her medicine  yet today. She needs close followup with her primary doctor. She was asymptomatic, so was no concern for hypertensive emergency.

## 2010-08-17 ENCOUNTER — Ambulatory Visit (INDEPENDENT_AMBULATORY_CARE_PROVIDER_SITE_OTHER): Payer: Self-pay | Admitting: Internal Medicine

## 2010-08-17 ENCOUNTER — Encounter: Payer: Self-pay | Admitting: Internal Medicine

## 2010-08-17 DIAGNOSIS — E109 Type 1 diabetes mellitus without complications: Secondary | ICD-10-CM

## 2010-08-17 DIAGNOSIS — I10 Essential (primary) hypertension: Secondary | ICD-10-CM

## 2010-08-17 DIAGNOSIS — K922 Gastrointestinal hemorrhage, unspecified: Secondary | ICD-10-CM

## 2010-08-17 MED ORDER — SPIRONOLACTONE 50 MG PO TABS: 50.0000 mg | ORAL_TABLET | Freq: Every day | ORAL | Status: DC

## 2010-08-17 NOTE — Progress Notes (Signed)
  Subjective:    Patient ID: Debra [name], female    DOB: Dec 09, 1965, 45 y.o.   MRN: [id number]  HPI Debra [name] is a pleasant 45 woman with past medical history of uncontrolled hypertension, type I DM on insulin pump who comes the clinic with chief complaint of diarrhea and blood in stool. Her diarrhea started  today and is watery and has about 5 bowel movements. Denies any fever, chills, night sweats. Does complain of crampy abdominal pain which is controlled by dicyclomine. She did use loperamide syrup all day but didn't help much. She also reports having blood in the stool lately and says that she had polyps removed multiple times in the past. Her blood pressure today is 2o5/100 and she pushed taking all blood pressure medication regularly. Of note her mother died during her hemodialysis session last Wednesday and she says she stressed out and her blood pressure and sugars were high due to this since then. Denies any chest pain, shortness of breath, cough, nausea, vomiting. She did go to sports medicine clinic for her trigger finger and got steroid injections. She has an appointment again with them on next Friday the   Review of Systems    as per history of present illness Objective:   Physical Exam    Constitutional: Vital signs reviewed.  Patient is a well-developed and well-nourished in no acute distress and cooperative with exam. Alert and oriented x3.  Head: Normocephalic and atraumatic Mouth: no erythema or exudates, MMM Eyes: PERRL, EOMI, conjunctivae normal, No scleral icterus.  Neck: Supple, Trachea midline normal ROM, No JVD Cardiovascular: RRR, S1 normal, S2 normal, no MRG, pulses symmetric and intact bilaterally Pulmonary/Chest: CTAB, no wheezes, rales, or rhonchi Abdominal: Soft. Non-tender, non-distended, bowel sounds are normal, no masses, organomegaly, or guarding present.  Musculoskeletal: No joint deformities, erythema.  Neurological: A&O x3, Strenght is normal and  symmetric bilaterally, cranial nerve II-XII are grossly intact, no focal motor deficit, sensory intact to light touch bilaterally.  Skin: Warm, dry and intact. No rash, cyanosis, or clubbing.       Assessment & Plan:

## 2010-08-17 NOTE — Patient Instructions (Signed)
Please make followup appointment in 4 to 6 weeks. Please make an appointment with your GI Dr. I will increase the dose of Spironolactone 50 mg by mouth daily from now. So he can take 2 pills daily until the bottle is completely empty and then he can get the new prescription filled for 50 mg tablets.

## 2010-08-17 NOTE — Assessment & Plan Note (Signed)
I will increase the spironolactone to 50 mg daily and check BMP today. Her severely high blood pressure might be due to recent stress of her mother's death along with her baseline her blood pressure. We'll see her back in 4-6 weeks and reevaluate her and make appropriate changes as needed.

## 2010-08-17 NOTE — Assessment & Plan Note (Signed)
Mean CBG 275 per meter download. Similar to the last visit. Will likely make an appointment with Jamison Neighbor during the next visit.

## 2010-08-18 ENCOUNTER — Encounter: Payer: Self-pay | Admitting: Internal Medicine

## 2010-08-19 ENCOUNTER — Other Ambulatory Visit: Payer: Self-pay

## 2010-08-19 ENCOUNTER — Ambulatory Visit (INDEPENDENT_AMBULATORY_CARE_PROVIDER_SITE_OTHER): Payer: Self-pay | Admitting: Family Medicine

## 2010-08-19 ENCOUNTER — Encounter: Payer: Self-pay | Admitting: Family Medicine

## 2010-08-19 VITALS — BP 191/115

## 2010-08-19 DIAGNOSIS — M6530 Trigger finger, unspecified finger: Secondary | ICD-10-CM

## 2010-08-19 DIAGNOSIS — G609 Hereditary and idiopathic neuropathy, unspecified: Secondary | ICD-10-CM

## 2010-08-19 DIAGNOSIS — G629 Polyneuropathy, unspecified: Secondary | ICD-10-CM | POA: Insufficient documentation

## 2010-08-19 MED ORDER — GABAPENTIN 400 MG PO TABS: ORAL_TABLET | ORAL | Status: DC

## 2010-08-19 NOTE — Progress Notes (Signed)
Subjective:    Patient ID: Debra [name], female    DOB: [birth year], 45 y.o.   MRN: [id number]  HPI  1) followup to trigger her fingers both of which were injected at last office visit. PT is only about 20% better. This was her right ring finger and her left index finger. She is having a lot of trouble doing anything with her hands because of this and some numbness she is having. 2) followup bilateral foot pain. Partly from numbness related to peripheral neuropathy. Has also had several surgeries for toe contracture, tendon triggers. 3. Peripheral neuropathy as above. She thinks it is related to her diabetes. She had previously been on Lyrica which seemed to help quite a bit but she lost her insurance and was unable to afford that. #4. Hypertension. She saw her PCP yesterday and had some of her medication increased. She has followup appointment with him in 4 weeks. Review of Systems Denies fever or weight gain, no weight loss that is atypical. Denies skin rash. Denies joint pains or swelling. Issues with stiffness of her fingers and toes as per history of present illness. Issues with loss of sensation peripherally enhancing and feet as per history of present illness. She also notices some difficulty with balance, partly because she cannot feel her feet very well.    Objective:   Physical Exam    GENERAL: Well-developed female no acute distress HANDS: All of her fingers had some stiffness in extension. The right ring and left long finger has nodules in the flexor tendon that are tender to palpation. She's getting some fairly significant contraction flexure of the left long finger. She lacks full extension by 5. The right brain and left long finger do not flex completely when she makes a fist. FEET: Hallux rigidus bilaterally. Well-healed scars on the dorsum of the right foot. There is some shiny skin and indentation on the second toe of each foot secondary to great toe of her left. SKIN: No  excessive callus. No open sores or areas of erythema except on the second toe bilaterally. NEURO: Unsteady on her feet when she is standing without assistance. After a few seconds she can regain balance but is only about 80% stable. She rises from a chair without much difficulty but does use the arm rest for systems. Decreased sensation to soft touch there is fairly significant and bilateral hands up to the area of the wrist and bilateral feet up to the ankle. She has poor proprioception of bilateral toes.  INJECTION: Patient was given informed consent, signed copy in the chart. Appropriate time out was taken. Area prepped and draped in usual sterile fashion. 1/2 cc of kenalog plus  1/2 cc of lidocaine was injected into the right ring finger nodule of the flexor tendon just proximal to the A1 pulley using a(n) volar approach. The patient tolerated the procedure well. There were no complications. Post procedure instructions were given.  INJECTION: Patient was given informed consent, signed copy in the chart. Appropriate time out was taken. Area prepped and draped in usual sterile fashion. 1/2 cc of kenalog plus  1/2 cc of lidocaine was injected into the left long finger flexor tendon just proximal to te A1 pulley using a(n) violar approach. The patient tolerated the procedure well. There were no complications. Post procedure instructions were given.    Assessment & Plan:  #1. Multiple trigger fingers had been released surgically in the past. She had corticosteroid injection a few weeks ago with only  minimal relief. She has no insurance to setting her up for further surgery is difficult we will try to set her up at [facility]. In the meantime I did agree to repeat her corticosteroid injections today. We'll also start her on extension exercises of all fingers multiple times a day as I think she's getting some significant contractures. #2. Bilateral foot pain with pes planus, hallux rigidus it is partly  postsurgical. I adjusted her sports insoles taking away the scaphoid pad and adding extra small metatarsal pad. Like to see her back for construction of custom molded orthotics. #3. Peripheral neuropathy that is starting to affect her balance. She is followed for her diabetes and hypertension at internal medicine clinic. I will start her on Neurontin and taper up to 400 mg 3 times a day by the time I see her back. I wonder if there is some underlying issues such as sarcoid or other systemic issues. I have not seen anyone with this many issues with trigger fingers and foot issues. I will copy of this note to her PCP.

## 2010-08-20 LAB — BASIC METABOLIC PANEL WITH GFR
BUN: 8 mg/dL (ref 6–23)
CO2: 25 mEq/L (ref 19–32)
Chloride: 96 mEq/L (ref 96–112)
Creat: 0.75 mg/dL (ref 0.50–1.10)

## 2010-09-09 ENCOUNTER — Encounter: Payer: Self-pay | Admitting: Family Medicine

## 2010-09-09 ENCOUNTER — Ambulatory Visit (INDEPENDENT_AMBULATORY_CARE_PROVIDER_SITE_OTHER): Payer: Self-pay | Admitting: Family Medicine

## 2010-09-09 VITALS — BP 168/108

## 2010-09-09 DIAGNOSIS — M79609 Pain in unspecified limb: Secondary | ICD-10-CM

## 2010-09-09 DIAGNOSIS — G8929 Other chronic pain: Secondary | ICD-10-CM

## 2010-09-09 DIAGNOSIS — G609 Hereditary and idiopathic neuropathy, unspecified: Secondary | ICD-10-CM

## 2010-09-09 DIAGNOSIS — M722 Plantar fascial fibromatosis: Secondary | ICD-10-CM

## 2010-09-09 NOTE — Progress Notes (Signed)
  Subjective:    Patient ID: Debra [name], female    DOB: [birth year], 45 y.o.   MRN: [id number]  HPI  Here for manufacture of custom molded orthotics for her bilateral foot pain. The pain is unchanged. She continues to have daily chronic pain particularly bad in the morning when she first gets up. Pain is on the plantar surface of the foot and extends from the heel to the forefoot. Pain can be a 8/10. Also followup of multiple trigger fingers. She has not yet heard from the orthopedic surgeon. Continues to have significant problems with pain in the function of her fingers.  Review of Systems No unusual weight change, no fever.    Objective:   Physical Exam    GENERAL: Well-developed female no acute distress  Patient was fitted for aTelephone as well: standard, cushioned, semi-rigid orthotic. The orthotic was heated, placed on the orthotic stand. The patient was positioned in subtalar neutral position and 10 degrees of ankle dorsiflexion in a weight bearing stance on the heated orthotic blank After completion of molding, a stable base was applied to the orthotic blank. The blank was ground to a stable position for weight bearing. Blank: blue swirl  Base:blue foam Posting:ex small MT pads  Size: 7    Assessment & Plan:  B foot pain status post several foot and toe surgeries. Significant loss of transverse arch with some rigidity of her first and second rays. Today we placed her in custom molded orthotics. We'll see how this does. I checked on her referral to the hand surgeon and it has been done. We have referred her to [facility]. She is informed if she doesn't hear back from them in 2 weeks to let us know. She'll followup with [place] regarding other issues when necessary and followup with a hand surgeon regarding her multiple trigger fingers. Face-to-face time total 45 minutes in the manufacture and feeding of her custom molded orthotics.

## 2010-09-14 ENCOUNTER — Encounter: Payer: Self-pay | Admitting: *Deleted

## 2010-09-14 NOTE — Progress Notes (Signed)
Pt has been scheduled for an appointment with [facility] ortho for revision trigger fingers consultation 09/30/10 @ 930 with Dr. Luiz Blare.

## 2010-09-21 ENCOUNTER — Ambulatory Visit (INDEPENDENT_AMBULATORY_CARE_PROVIDER_SITE_OTHER): Payer: Self-pay | Admitting: Family

## 2010-09-21 ENCOUNTER — Other Ambulatory Visit: Payer: Self-pay | Admitting: Family

## 2010-09-21 ENCOUNTER — Encounter: Payer: Self-pay | Admitting: Family

## 2010-09-21 VITALS — BP 190/92 | HR 88 | Temp 97.3°F | Ht 64.5 in | Wt 181.4 lb

## 2010-09-21 DIAGNOSIS — N938 Other specified abnormal uterine and vaginal bleeding: Secondary | ICD-10-CM

## 2010-09-21 DIAGNOSIS — N949 Unspecified condition associated with female genital organs and menstrual cycle: Secondary | ICD-10-CM

## 2010-09-21 DIAGNOSIS — Z01419 Encounter for gynecological examination (general) (routine) without abnormal findings: Secondary | ICD-10-CM | POA: Insufficient documentation

## 2010-09-21 DIAGNOSIS — Z01812 Encounter for preprocedural laboratory examination: Secondary | ICD-10-CM

## 2010-09-21 LAB — POCT PREGNANCY, URINE: Preg Test, Ur: NEGATIVE

## 2010-09-21 NOTE — Progress Notes (Signed)
  Subjective:     Debra [name] is an 45 y.o. woman who presents for irregular menses. She had been bleeding irregularly since LMP of  4/22.  Reports using 12-15 tampons a day. Reports passing clots.  Clots are plum sized. Dysmenorrhea:severe, occurring throughout menses. Cyclic symptoms include: headache and nausea. Current contraception: IUD. History of infertility: no. History of abnormal Pap smear: no.  Last pap smear in 2009.  Pt reports intermittent headaches associated with blood pressure, none today.  Denies vision changes.    Menstrual History: OB History    Grav Para Term Preterm Abortions TAB SAB Ect Mult Living   2 2 1 1  0 0 0 0 0 2       Patient's last menstrual period was 06/19/2010. Period Pattern: Irregular Menstrual Flow: Heavy Menstrual Control: Tampon    Review of Systems  Negative, except what was included in HPI.  Objective:    BP 190/92  Pulse 88  Temp(Src) 97.3 F (36.3 C) (Oral)  Ht 5' 4.5" (1.638 m)  Wt 181 lb 6.4 oz (82.283 kg)  BMI 30.66 kg/m2  LMP 06/19/2010  General:   alert and cooperative  Skin:    normal and no rash or abnormalities  Neck:  no adenopathy, no carotid bruit, no JVD, supple, symmetrical, trachea midline and thyroid not enlarged, symmetric, no tenderness/mass/nodules  Abdomen:  abnormal findings:  + tenderness with pelvic exam  Pelvic:   cervix normal in appearance, external genitalia normal, no cervical motion tenderness, rectovaginal septum normal, vagina normal without discharge and IUD strings present; uterus and adnexa difficult to palpate secondary to weight.       Assessment:    dysfunctional uterine bleeding and uncontrolled hypertension    Plan:  Consulted with Dr. Debroah Loop - since patient is asymptomatic with blood pressure have follow-up with PCP asap; defer biopsy today, obtain pelvic ultrasound; offer IUD removal (pt declined).    All questions answered. Await pap smear results. Blood tests: TSH and CBC. Pelvic  ultrasound. Thin prep Pap smear.

## 2010-09-21 NOTE — Progress Notes (Signed)
UPT:negative

## 2010-09-21 NOTE — Progress Notes (Signed)
Addended by: Lynnell Dike on: 09/21/2010 03:45 PM   Modules accepted: Orders

## 2010-09-22 LAB — CBC
HCT: 41.5 % (ref 36.0–46.0)
Hemoglobin: 13.8 g/dL (ref 12.0–15.0)
MCH: 29.5 pg (ref 26.0–34.0)
MCHC: 33.3 g/dL (ref 30.0–36.0)
MCV: 88.7 fL (ref 78.0–100.0)
RDW: 14.1 % (ref 11.5–15.5)

## 2010-09-27 ENCOUNTER — Ambulatory Visit (HOSPITAL_COMMUNITY)
Admission: RE | Admit: 2010-09-27 | Discharge: 2010-09-27 | Disposition: A | Discharge status: Home / Self Care | Payer: Self-pay | Source: Ambulatory Visit | Attending: Obstetrics & Gynecology | Admitting: Obstetrics & Gynecology

## 2010-09-27 DIAGNOSIS — Z30431 Encounter for routine checking of intrauterine contraceptive device: Secondary | ICD-10-CM | POA: Insufficient documentation

## 2010-09-27 DIAGNOSIS — N949 Unspecified condition associated with female genital organs and menstrual cycle: Secondary | ICD-10-CM | POA: Insufficient documentation

## 2010-09-27 DIAGNOSIS — N938 Other specified abnormal uterine and vaginal bleeding: Secondary | ICD-10-CM | POA: Insufficient documentation

## 2010-09-28 ENCOUNTER — Encounter: Payer: Self-pay | Admitting: Internal Medicine

## 2010-09-30 ENCOUNTER — Encounter: Payer: Self-pay | Admitting: Internal Medicine

## 2010-10-06 ENCOUNTER — Ambulatory Visit (INDEPENDENT_AMBULATORY_CARE_PROVIDER_SITE_OTHER): Payer: Self-pay | Admitting: Internal Medicine

## 2010-10-06 ENCOUNTER — Other Ambulatory Visit (INDEPENDENT_AMBULATORY_CARE_PROVIDER_SITE_OTHER): Payer: Self-pay

## 2010-10-06 ENCOUNTER — Encounter: Payer: Self-pay | Admitting: Internal Medicine

## 2010-10-06 VITALS — BP 136/84 | HR 76 | Ht 64.0 in | Wt 186.0 lb

## 2010-10-06 DIAGNOSIS — R197 Diarrhea, unspecified: Secondary | ICD-10-CM

## 2010-10-06 DIAGNOSIS — R195 Other fecal abnormalities: Secondary | ICD-10-CM

## 2010-10-06 LAB — COMPREHENSIVE METABOLIC PANEL
CO2: 28 mEq/L (ref 19–32)
Creatinine, Ser: 0.8 mg/dL (ref 0.4–1.2)
GFR: 101.89 mL/min (ref >60.00)
Glucose, Bld: 251 mg/dL — ABNORMAL HIGH (ref 70–99)
Total Bilirubin: 0.7 mg/dL (ref 0.3–1.2)

## 2010-10-06 MED ORDER — CHOLESTYRAMINE 4 G PO PACK: PACK | ORAL | Status: DC

## 2010-10-06 MED ORDER — METRONIDAZOLE 250 MG PO TABS: ORAL_TABLET | ORAL | Status: DC

## 2010-10-06 NOTE — Progress Notes (Signed)
Debra [name] 13-Oct-1965 MRN [id number]     History of Present Illness:  This is a 45 year old [place] American female with chronic diarrhea which has become worse in the last year. She is having up to 20 bowel movements a day and at night. She has intermittent rectal bleeding. She has crampy abdominal pain. Patient is a diabetic who is insulin dependent. A  colonoscopy in June 2008 showed internal hemorrhoids. Random biopsies showed no evidence of microscopic colitis. A prior colonoscopy in 2001 showed an adenomatous polyp. She is status post cholecystectomy. Her diarrhea became worse after the gallbladder surgery. Several years ago. Other medical problems include high blood pressure and neuropathy. She denies symptoms of gastroparesis.   Past Medical History  Diagnosis Date  . Allergy     seasonal  . Heart murmur     Birth  . Hyperlipidemia     2005  . Stroke     in 1990  . Neuromuscular disorder     Neuropathy - hands/feet  . Anxiety     1990  . Anemia     2005  . Asthma     2000  . Diabetes mellitus     type i juvenille; 1988  . Hypertension     1998  . Mental disorder     Depression 1990  . Anal fissure   . Hx of adenomatous colonic polyps   . Internal hemorrhoids 04/24/06    on colonoscopy  . Migraine    Past Surgical History  Procedure Date  . Cholecystectomy   . Trigger finger release   . Eye surgery   . Cesarean section     reports that she has never smoked. She does not have any smokeless tobacco history on file. She reports that she does not drink alcohol or use illicit drugs. family history includes Breast cancer in her maternal grandmother; Hypertension in her father and mother; Kidney disease in her mother; Ovarian cancer in her paternal grandmother; and Prostate cancer in her maternal grandfather and paternal grandfather. Allergies  Allergen Reactions  . Penicillins Nausea And Vomiting and Rash        Review of Systems: Denies dysphagia,  odynophagia, shortness of breath or chest pain  The remainder of the 10  point ROS is negative except as outlined in H&P   Physical Exam: General appearance  Well developed, in no distress. Eyes- non icteric. HEENT nontraumatic, normocephalic. Mouth no lesions, tongue papillated, no cheilosis. Neck supple without adenopathy, thyroid not enlarged, no carotid bruits, no JVD. Lungs Clear to auscultation bilaterally. Cor normal S1 normal S2, regular rhythm , no murmur,  quiet precordium. Abdomen protuberant soft diffusely tender in all quadrants. No rebound. No ascites or palpable mass. Rectal: Small amount of Hemoccult positive stool. Extremities no pedal edema. Skin no lesions. Neurological alert and oriented x 3. Psychological normal mood and affect.  Assessment and Plan:  Problem #1 Chronic diarrhea dating back to cholecystectomy. Patient has had recent deterioration having nocturnal as well as daytime watery stools. We need to rule out bacterial overgrowth. We need to consider post cholecystectomy diarrhea due to bile overflow. We may also need to consider metabolic diarrhea although TSH level was normal. We will check tissue transglutaminase levels. She may need a small bowel follow through . We will schedule colonoscopy to rule out Crohn's disease. We will check her sedimentation rate and take biopsies from the terminal ileum. She will be started empirically on Flagyl 250 mg 3 times a day for  a week and Questran 4 g packet daily. Because of her history of hypertension, we may collect 24 hr urine for HIAA . At later time.   10/06/2010 Debra [name]

## 2010-10-06 NOTE — Patient Instructions (Signed)
You have been scheduled for a colonoscopy. Please follow written instructions given to you at your visit today. We will contact Dr Allena Katz regarding your insulin pump. Your physician has requested that you go to the basement for the following lab work before leaving today: Stool for C Diff, Lactoferrin, Sedimentation rate, TtG, CMET. We have sent the following medications to your pharmacy for you to pick up at your convenience: Questran. Take 1 pack dissolved in at least 8 ounces of water or juice and drink once daily at least 2 hours apart from all other medications. Flagyl. Take 1 tablet by mouth three times daily x 1 week. CC: Dr Lyn Hollingshead

## 2010-10-10 ENCOUNTER — Telehealth: Payer: Self-pay | Admitting: *Deleted

## 2010-10-10 ENCOUNTER — Encounter: Payer: Self-pay | Admitting: *Deleted

## 2010-10-10 NOTE — Telephone Encounter (Signed)
Patient notified of results.

## 2010-10-10 NOTE — Telephone Encounter (Signed)
Debra [name] at dr brodie's office calls to ask if letter has been reviewed and responded to as of yet re: insulin pump, diabetes and upcoming procedure. i have made a copy of the letter and placed in your mailbox or you may select the letter tab under chart review and read at once. Please send a response, you may make it a NEW ENCOUNTER and dr Juanda Chance should see your response soon, none the less a copy of her letter is in your box. i called [facility] and the last insulin for her pump that she picked up was June 1, they state she could quite possibly be out of insulin. Shall we try to bring her in for eval? Please advise.

## 2010-10-10 NOTE — Telephone Encounter (Signed)
Message copied by Daphine Deutscher on Mon Oct 10, 2010  3:12 PM ------      Message from: Hart Carwin      Created: Mon Oct 10, 2010  1:35 PM       Please call pt with negative test for sprue

## 2010-10-11 ENCOUNTER — Other Ambulatory Visit: Payer: Self-pay

## 2010-10-11 DIAGNOSIS — R197 Diarrhea, unspecified: Secondary | ICD-10-CM

## 2010-10-12 LAB — CLOSTRIDIUM DIFFICILE BY PCR: Toxigenic C. Difficile by PCR: NOT DETECTED

## 2010-10-12 NOTE — Telephone Encounter (Signed)
I would be down today in clinic and check the mail box. But in any case, Ms. Foland just started in our clinic in 06/2010 and before that had no PCP for about 2 years. The best thing for her would be to call her and tell her to pick up insulin and also have a f/u appt if she is still non-compliant.  Thanks, Daleen Bo.

## 2010-10-13 ENCOUNTER — Encounter: Payer: Self-pay | Admitting: Internal Medicine

## 2010-10-13 ENCOUNTER — Ambulatory Visit (INDEPENDENT_AMBULATORY_CARE_PROVIDER_SITE_OTHER): Payer: Self-pay | Admitting: Internal Medicine

## 2010-10-13 VITALS — BP 182/95 | HR 95 | Temp 97.2°F | Ht 64.5 in | Wt 187.2 lb

## 2010-10-13 DIAGNOSIS — E109 Type 1 diabetes mellitus without complications: Secondary | ICD-10-CM

## 2010-10-13 DIAGNOSIS — I10 Essential (primary) hypertension: Secondary | ICD-10-CM

## 2010-10-13 DIAGNOSIS — S99929A Unspecified injury of unspecified foot, initial encounter: Secondary | ICD-10-CM

## 2010-10-13 DIAGNOSIS — Z23 Encounter for immunization: Secondary | ICD-10-CM

## 2010-10-13 DIAGNOSIS — S8990XA Unspecified injury of unspecified lower leg, initial encounter: Secondary | ICD-10-CM

## 2010-10-13 DIAGNOSIS — S90931A Unspecified superficial injury of right great toe, initial encounter: Secondary | ICD-10-CM

## 2010-10-13 DIAGNOSIS — S90929A Unspecified superficial injury of unspecified foot, initial encounter: Secondary | ICD-10-CM

## 2010-10-13 LAB — GLUCOSE, CAPILLARY: Glucose-Capillary: 380 mg/dL — ABNORMAL HIGH (ref 70–99)

## 2010-10-13 LAB — POCT GLYCOSYLATED HEMOGLOBIN (HGB A1C): Hemoglobin A1C: 9.9

## 2010-10-13 MED ORDER — FUROSEMIDE 20 MG PO TABS: 40.0000 mg | ORAL_TABLET | Freq: Two times a day (BID) | ORAL | Status: DC

## 2010-10-13 MED ORDER — IBUPROFEN 200 MG PO TABS: 200.0000 mg | ORAL_TABLET | Freq: Four times a day (QID) | ORAL | Status: DC | PRN

## 2010-10-13 NOTE — Patient Instructions (Signed)
Please follow up with your appointment in 1 week with your PCP. Please call the clinic if your toe gets red, swollen and starts oozing pus again.

## 2010-10-14 ENCOUNTER — Other Ambulatory Visit: Payer: Self-pay | Admitting: *Deleted

## 2010-10-14 ENCOUNTER — Encounter: Payer: Self-pay | Admitting: Internal Medicine

## 2010-10-14 ENCOUNTER — Telehealth: Payer: Self-pay | Admitting: *Deleted

## 2010-10-14 DIAGNOSIS — S90931A Unspecified superficial injury of right great toe, initial encounter: Secondary | ICD-10-CM | POA: Insufficient documentation

## 2010-10-14 MED ORDER — POLYETHYLENE GLYCOL 3350 17 GM/SCOOP PO POWD: ORAL | Status: DC

## 2010-10-14 NOTE — Assessment & Plan Note (Signed)
We could not download her meter. Her A1c is 9.9 today it has improved from the past(11.4) but is still above the goal. Continue the current regimen. We'll schedule her appointment with Jamison Neighbor at her next visit. Patient is scheduled for colonoscopy  next week and wanted to know about the management of her insulin pump. After discussing with Jamison Neighbor patient was advised to continue the basal insulin but do not give additional boluses. She voices clear understanding. Lab Results  Component Value Date   HGBA1C 9.9 10/13/2010   CREATININE 0.8 10/06/2010   CREATININE 0.75 08/19/2010   CHOL 182 07/26/2010   HDL 45 07/26/2010   TRIG 112 07/26/2010    Last eye exam and foot exam: No results found for this basename: HMDIABEYEEXA, HMDIABFOOTEX    Assessment: Diabetes control: not controlled Progress toward goals: improved Barriers to meeting goals: no barriers identified  Plan: Diabetes treatment: continue current medications Refer to: diabetes educator for self-management training and diabetes educator for medical nutrition therapy Instruction/counseling given: reminded to bring blood glucose meter & log to each visit, reminded to bring medications to each visit, discussed foot care, discussed the need for weight loss, discussed diet and discussed sick day management

## 2010-10-14 NOTE — Telephone Encounter (Signed)
Patient states that she was "never told what to do" about her insulin pump for procedure. I have spoken to Dr Dorthula Rue, herself and she states that patient was advised (as evidenced in her office note) to continue basal insulin but to give herself NO additional bolous. I have spoken to patient and have again relayed this information to her and she verbalizes understanding.

## 2010-10-14 NOTE — Progress Notes (Signed)
Dr Dorthula Rue discussed insulin pump instructions for patient's colonoscopy with the patient at her visit on 10/13/10 as per this comment in his office note:  Diabetes mellitus type 1 - SAWHNEY,MEGHA 10/14/2010 8:51 AM Signed  We could not download her meter. Her A1c is 9.9 today it has improved from the past(11.4) but is still above the goal. Continue the current regimen. We'll schedule her appointment with Jamison Neighbor at her next visit. Patient is scheduled for colonoscopy next week and wanted to know about the management of her insulin pump. After discussing with Jamison Neighbor patient was advised to continue the basal insulin but do not give additional boluses. She voices clear understanding.

## 2010-10-14 NOTE — Assessment & Plan Note (Signed)
Accidentally hit her toe against the chair resulting in superficial injury. On exam there is no evidence of any bluish discoloration swelling and edema or warmth.  -Will treat her with NSAIDs at this time. - Given her diabetes she was warned about the red flags including swelling, drainage of pus or  development of systemic symptoms including fever, nausea or  vomiting then she needs to call our clinic  She voices clear understanding.

## 2010-10-14 NOTE — Assessment & Plan Note (Signed)
Her blood pressure was in 200 systolic when she walked into the clinic. Recheck in 15 minutes was 182/95. She claims that she took her blood pressure medications today. In the setting of this elevated blood pressure, her Lasix dose was increased from 20 twice a day to 40 twice a day. She was advised to followup in a week for blood pressure recheck. Will get a BMET with next visit. Lab Results  Component Value Date   NA 135 10/06/2010   K 4.2 10/06/2010   CL 100 10/06/2010   CO2 28 10/06/2010   BUN 13 10/06/2010   CREATININE 0.8 10/06/2010   CREATININE 0.75 08/19/2010    BP Readings from Last 3 Encounters:  10/13/10 182/95  10/06/10 136/84  09/21/10 190/92    Assessment: Hypertension control:  severely elevated  Progress toward goals:  unchanged Barriers to meeting goals:  no barriers identified  Plan: Hypertension treatment:  will increase the dose of her lasix from 20 BID to 40 BID

## 2010-10-14 NOTE — Progress Notes (Signed)
  Subjective:    Patient ID: Debra [name], female    DOB: Feb 10, 1966, 45 y.o.   MRN: [id number]  HPI: 45 year old woman with past medical history significant for type 1 diabetes on insulin pump, hypertension comes to the clinic for right great toe injury 2 days ago.  She accidentally hit her toe against the chair 2 days ago and started bleeding. Now her toenail is about to break but has not fallen apart. She reports associated pain, rates her pain 10/10. Denies any systemic complaints including fever, chills, nausea or vomiting.  She states that her blood sugars are running all over. She has been scheduled for a colonoscopy the next week and was wondering how to manage her insulin pump as she would not be eating a couple of hours.  She states that she takes all her medications regularly.    Review of Systems  Constitutional: Negative for fever, chills, appetite change and fatigue.  HENT: Negative for nosebleeds, congestion, rhinorrhea, sneezing and postnasal drip.   Eyes: Negative for photophobia and visual disturbance.  Respiratory: Negative for apnea, cough, choking, chest tightness, shortness of breath, wheezing and stridor.   Cardiovascular: Negative for chest pain, palpitations and leg swelling.  Gastrointestinal: Negative for nausea, abdominal pain, diarrhea and constipation.  Genitourinary: Negative for dysuria, urgency, hematuria and decreased urine volume.  Musculoskeletal: Negative for arthralgias.  Neurological: Positive for headaches. Negative for speech difficulty, light-headedness and numbness.  Hematological: Negative for adenopathy.       Objective:   Physical Exam  Constitutional: She is oriented to person, place, and time. She appears well-developed and well-nourished. No distress.  HENT:  Head: Normocephalic and atraumatic.  Mouth/Throat: No oropharyngeal exudate.  Eyes: Conjunctivae and EOM are normal. Pupils are equal, round, and reactive to light.  Neck:  Normal range of motion. Neck supple. No JVD present. No tracheal deviation present. No thyromegaly present.  Cardiovascular: Normal rate, regular rhythm, normal heart sounds and intact distal pulses.  Exam reveals no gallop and no friction rub.   No murmur heard. Pulmonary/Chest: Effort normal and breath sounds normal. No stridor. No respiratory distress. She has no wheezes. She has no rales. She exhibits no tenderness.  Abdominal: Soft. Bowel sounds are normal. She exhibits no distension and no mass. There is no tenderness. There is no rebound and no guarding.  Musculoskeletal: Normal range of motion.       right great toe nail cracked but has not fallen apart, no erythema, swelling , warmth or bluish discoloration.  Lymphadenopathy:    She has no cervical adenopathy.  Neurological: She is alert and oriented to person, place, and time. She has normal reflexes. She displays normal reflexes. No cranial nerve deficit. She exhibits normal muscle tone. Coordination normal.  Skin: Skin is warm. She is not diaphoretic.          Assessment & Plan:

## 2010-10-18 ENCOUNTER — Ambulatory Visit (AMBULATORY_SURGERY_CENTER): Payer: Self-pay | Admitting: Internal Medicine

## 2010-10-18 ENCOUNTER — Encounter: Payer: Self-pay | Admitting: Internal Medicine

## 2010-10-18 ENCOUNTER — Encounter: Payer: Self-pay | Admitting: *Deleted

## 2010-10-18 VITALS — BP 172/92 | HR 86 | Temp 97.0°F | Resp 20 | Ht 64.0 in | Wt 186.0 lb

## 2010-10-18 DIAGNOSIS — R197 Diarrhea, unspecified: Secondary | ICD-10-CM

## 2010-10-18 MED ORDER — DIPHENOXYLATE-ATROPINE 2.5-0.025 MG PO TABS: 1.0000 | ORAL_TABLET | Freq: Four times a day (QID) | ORAL | Status: DC | PRN

## 2010-10-18 MED ORDER — SODIUM CHLORIDE 0.9 % IV SOLN: 500.0000 mL | INTRAVENOUS | Status: DC

## 2010-10-18 NOTE — Patient Instructions (Addendum)
Please refer to your blue and neon green sheets for instructions regarding diet and activity for the rest of today.  Repeat exam in 10 years.  You may resume your medications as you would normally take them.  Lomotil prescription given.

## 2010-10-18 NOTE — Progress Notes (Signed)
0750- Pt's accucheck 434 and 429.  Pt bolused 10 units of Novolog per insulin pump per self

## 2010-10-19 ENCOUNTER — Encounter: Payer: Self-pay | Admitting: Internal Medicine

## 2010-10-19 ENCOUNTER — Telehealth: Payer: Self-pay | Admitting: Obstetrics and Gynecology

## 2010-10-19 ENCOUNTER — Ambulatory Visit (INDEPENDENT_AMBULATORY_CARE_PROVIDER_SITE_OTHER): Payer: Self-pay | Admitting: Internal Medicine

## 2010-10-19 ENCOUNTER — Telehealth: Payer: Self-pay | Admitting: Internal Medicine

## 2010-10-19 ENCOUNTER — Telehealth: Payer: Self-pay

## 2010-10-19 VITALS — BP 190/93 | HR 79 | Temp 98.1°F | Ht 64.5 in | Wt 187.4 lb

## 2010-10-19 DIAGNOSIS — R197 Diarrhea, unspecified: Secondary | ICD-10-CM

## 2010-10-19 DIAGNOSIS — X58XXXA Exposure to other specified factors, initial encounter: Secondary | ICD-10-CM

## 2010-10-19 DIAGNOSIS — I10 Essential (primary) hypertension: Secondary | ICD-10-CM

## 2010-10-19 DIAGNOSIS — S90129A Contusion of unspecified lesser toe(s) without damage to nail, initial encounter: Secondary | ICD-10-CM

## 2010-10-19 DIAGNOSIS — S90211A Contusion of right great toe with damage to nail, initial encounter: Secondary | ICD-10-CM

## 2010-10-19 DIAGNOSIS — K529 Noninfective gastroenteritis and colitis, unspecified: Secondary | ICD-10-CM | POA: Insufficient documentation

## 2010-10-19 DIAGNOSIS — M6530 Trigger finger, unspecified finger: Secondary | ICD-10-CM

## 2010-10-19 DIAGNOSIS — S90931A Unspecified superficial injury of right great toe, initial encounter: Secondary | ICD-10-CM

## 2010-10-19 DIAGNOSIS — E109 Type 1 diabetes mellitus without complications: Secondary | ICD-10-CM

## 2010-10-19 LAB — BASIC METABOLIC PANEL WITH GFR
BUN: 13 mg/dL (ref 6–23)
CO2: 30 mEq/L (ref 19–32)
Calcium: 9.6 mg/dL (ref 8.4–10.5)
Chloride: 98 mEq/L (ref 96–112)
Creat: 0.88 mg/dL (ref 0.50–1.10)
GFR, Est Non African American: >60 mL/min (ref >60)

## 2010-10-19 LAB — GLUCOSE, CAPILLARY: Glucose-Capillary: 200 mg/dL — ABNORMAL HIGH (ref 70–99)

## 2010-10-19 NOTE — Telephone Encounter (Signed)
No ID on answering machine. 

## 2010-10-19 NOTE — Assessment & Plan Note (Signed)
Most likely IBS. She has diarrhea for long time now- watery, daily multiple times. Keeps up with fluid loss with drinking a lot of water. Had a recent colonoscopy per Dr. Juanda Chance and has many stool tests pending and will be followed up by her office in 3-4 weeks for diarrhea.

## 2010-10-19 NOTE — Telephone Encounter (Signed)
Patient called requesting ultrasound result performed July 2012. No future appointment on this patient to discuss results. Please advice.

## 2010-10-19 NOTE — Assessment & Plan Note (Signed)
Release surgery for trigger finger on 10/27/2010 at [facility].

## 2010-10-19 NOTE — Telephone Encounter (Signed)
Left a message for patient to call me. 

## 2010-10-19 NOTE — Assessment & Plan Note (Signed)
Blood pressure 190/82. Lasix dose increased to 40 twice a day last visit. We'll check BMP today. Chronically high blood pressure-will followup again during the next visit and make appropriate changes in medications as needed.

## 2010-10-19 NOTE — Assessment & Plan Note (Addendum)
Continue current pump regimen. HbA1c 9.9 during last visit on 10/13/2010. She doesn't have any insurance and she has last set of her insulin pump with her. She said she would be out of it soon and so will set her insulin regimen before she gets out of it. I will talk with Jamison Neighbor tomorrow in detail and set him to regimen for her and send a prescription to her pharmacy so that she can pick it up once she goes out of her insulin pump.

## 2010-10-19 NOTE — Patient Instructions (Signed)
Please make an appointment in 2-3 months. Call early if needed meanwhile. Follow up with Dr. Juanda Chance for diarrhea.

## 2010-10-19 NOTE — Progress Notes (Signed)
  Subjective:    Patient ID: Debra [name], female    DOB: 1965-11-02, 45 y.o.   MRN: [id number]  HPI Debra [name] is a 45 year woman with significant past with history of uncontrolled type I DM, hypertension, who comes to the clinic for a followup visit. She was seen on 10/13/2010 in the clinic for right big toe injury. She was not having any infection at that visit but today she complains of thick drainage from lateral side of right big toe. It seems like pus draining out, no redness or discoloration. No significant pain. She denies any fever, chills, headache, abdominal pain, chest pain, nausea, vomiting. She does complain of frequent daily watery diarrhea, for which she was referred to Dr. Juanda Chance with gastroenterology and she had a recent colonoscopy which just showed internal hemorrhoids and no other abnormalities. She has a bunch of stool studies pending with Dr. Regino Schultze office and she will be followed up by them for diarrhea in 3-4 weeks. She also has multiple trigger fingers for which she was referred to sports medicine clinic and was seen by Dr. Jennette Kettle, who eventually referred her to [facility] to have release surgery- which she is going to have on 10/27/2010.    Review of Systems    as per history of present illness, other of systems reviewed and negative Objective:   Physical Exam  Constitutional: Vital signs reviewed.  Patient is a well-developed and well-nourished in no acute distress and cooperative with exam. Alert and oriented x3.  Head: Normocephalic and atraumatic Mouth: no erythema or exudates, MMM Eyes: PERRL, EOMI, conjunctivae normal, No scleral icterus.  Neck: Supple, Trachea midline normal ROM, No JVD, mass, thyromegaly, or carotid bruit present.  Cardiovascular: RRR, S1 normal, S2 normal, no MRG, pulses symmetric and intact bilaterally Pulmonary/Chest: CTAB, no wheezes, rales, or rhonchi Abdominal: Soft. Non-tender, non-distended, bowel sounds are normal, no masses,  organomegaly, or guarding present.  Musculoskeletal: No joint deformities, erythema, or stiffness, ROM full and no nontender. Right great toe drains pus from lateral side if pressed on the nail.  Neurological: A&O x3, Strenght is normal and symmetric bilaterally, cranial nerve II-XII are grossly intact, no focal motor deficit.        Assessment & Plan:

## 2010-10-20 ENCOUNTER — Other Ambulatory Visit (HOSPITAL_COMMUNITY)
Admission: RE | Admit: 2010-10-20 | Discharge: 2010-10-20 | Disposition: A | Discharge status: Home / Self Care | Payer: Self-pay | Source: Ambulatory Visit | Attending: Internal Medicine | Admitting: Internal Medicine

## 2010-10-20 DIAGNOSIS — S90211A Contusion of right great toe with damage to nail, initial encounter: Secondary | ICD-10-CM | POA: Insufficient documentation

## 2010-10-20 MED ORDER — CLINDAMYCIN HCL 300 MG PO CAPS: 300.0000 mg | ORAL_CAPSULE | Freq: Three times a day (TID) | ORAL | Status: AC

## 2010-10-20 MED ORDER — CLINDAMYCIN HCL 300 MG PO CAPS: 300.0000 mg | ORAL_CAPSULE | Freq: Three times a day (TID) | ORAL | Status: DC

## 2010-10-20 NOTE — Assessment & Plan Note (Addendum)
Pt had a recent trauma to R great toe and was seen in clinic for that on 10/13/10 and was found to have no signs of infection then. I saw her again and she had small amount of purulent drainage from R great, if I press the nail. She seems to have infection in there and is uncontrolled diabetic. Also had surgeries and hardware in that foot, which makes her high risk for complications of missed infection. She needs a podiatry referral for this. I called Dr. Dorisann Frames office, but they don't accept our orange card and is a self pay clinic, which Pt can't afford. I will give her clindamycin 300 mg po tid for 1 week and refer her to general surgery to help with management.

## 2010-10-20 NOTE — Telephone Encounter (Signed)
Patient saw Miralax on medication list and wants to know if she is to take this. Explained to patient that this was the prep for the colon and she does not ned to be taking this. It was a one time order only.

## 2010-10-20 NOTE — Progress Notes (Signed)
Addended by: Lyn Hollingshead C on: 10/20/2010 12:29 PM   Modules accepted: Orders

## 2010-10-20 NOTE — Telephone Encounter (Signed)
Schedule a follow-up appointment to review the results.

## 2010-10-20 NOTE — Progress Notes (Signed)
Addended by: Neomia Dear on: 10/20/2010 05:10 PM   Modules accepted: Orders

## 2010-10-21 ENCOUNTER — Other Ambulatory Visit: Payer: Self-pay | Admitting: *Deleted

## 2010-10-24 ENCOUNTER — Telehealth: Payer: Self-pay | Admitting: *Deleted

## 2010-10-24 MED ORDER — GABAPENTIN 400 MG PO TABS: ORAL_TABLET | ORAL | Status: DC

## 2010-10-24 NOTE — Telephone Encounter (Signed)
Talked with [facility] MD on phone. Pt will be referred to Endocrinologist and also they started her on Labetolol for her uncontrolled HTN. Appreciated their help and also agreed with the plan.  Paulina Muchmore.

## 2010-10-24 NOTE — Telephone Encounter (Signed)
Talked with [facility] MD and discussed about the condition. She will be referred to Endocrinologist and also they started her on Labetolol. Appreciated their help and agreed with the plan.  Debra [name].

## 2010-10-24 NOTE — Progress Notes (Signed)
Addended by: Neomia Dear on: 10/24/2010 04:05 PM   Modules accepted: Orders

## 2010-10-24 NOTE — Telephone Encounter (Signed)
md from [facility] calls to say pt is scheduled for surg soon and has some htn issues, he would like to refer her to endocrinology there and change some of her meds, dr patel's pager# is given to him and dr patel made aware.

## 2010-10-24 NOTE — Progress Notes (Signed)
Addended by: Neomia Dear on: 10/24/2010 03:56 PM   Modules accepted: Orders

## 2010-10-24 NOTE — Progress Notes (Signed)
Addended by: Lyn Hollingshead C on: 10/24/2010 03:32 PM   Modules accepted: Orders

## 2010-10-24 NOTE — Telephone Encounter (Signed)
Called to [facility], pt does not use pharm listed

## 2010-10-25 ENCOUNTER — Other Ambulatory Visit: Payer: Self-pay | Admitting: Family Medicine

## 2010-10-25 ENCOUNTER — Encounter: Payer: Self-pay | Admitting: Internal Medicine

## 2010-10-25 ENCOUNTER — Other Ambulatory Visit: Payer: Self-pay | Admitting: *Deleted

## 2010-10-25 DIAGNOSIS — I10 Essential (primary) hypertension: Secondary | ICD-10-CM

## 2010-10-25 MED ORDER — GABAPENTIN 400 MG PO CAPS: 400.0000 mg | ORAL_CAPSULE | Freq: Three times a day (TID) | ORAL | Status: DC

## 2010-10-25 NOTE — Telephone Encounter (Signed)
[facility] can get accupril 40 mg daily free for pt.  Will you change from lisinopril?

## 2010-10-26 MED ORDER — LISINOPRIL 40 MG PO TABS: 40.0000 mg | ORAL_TABLET | Freq: Every day | ORAL | Status: DC

## 2010-10-26 MED ORDER — QUINAPRIL HCL 40 MG PO TABS: 40.0000 mg | ORAL_TABLET | Freq: Every day | ORAL | Status: DC

## 2010-10-26 NOTE — Progress Notes (Signed)
Addended by: Lyn Hollingshead C on: 10/26/2010 02:31 PM   Modules accepted: Orders

## 2010-10-26 NOTE — Telephone Encounter (Signed)
Rx changed to accupril and faxed to [facility]

## 2010-10-28 ENCOUNTER — Other Ambulatory Visit (HOSPITAL_COMMUNITY)
Admission: RE | Admit: 2010-10-28 | Discharge: 2010-10-28 | Disposition: A | Discharge status: Home / Self Care | Payer: Self-pay | Source: Ambulatory Visit | Attending: Advanced Practice Midwife | Admitting: Advanced Practice Midwife

## 2010-10-28 ENCOUNTER — Other Ambulatory Visit: Payer: Self-pay | Admitting: Advanced Practice Midwife

## 2010-10-28 ENCOUNTER — Encounter: Payer: Self-pay | Admitting: Advanced Practice Midwife

## 2010-10-28 ENCOUNTER — Ambulatory Visit (INDEPENDENT_AMBULATORY_CARE_PROVIDER_SITE_OTHER): Payer: Self-pay | Admitting: Advanced Practice Midwife

## 2010-10-28 DIAGNOSIS — N920 Excessive and frequent menstruation with regular cycle: Secondary | ICD-10-CM | POA: Insufficient documentation

## 2010-10-28 DIAGNOSIS — N938 Other specified abnormal uterine and vaginal bleeding: Secondary | ICD-10-CM | POA: Insufficient documentation

## 2010-10-28 DIAGNOSIS — N926 Irregular menstruation, unspecified: Secondary | ICD-10-CM

## 2010-10-28 DIAGNOSIS — N949 Unspecified condition associated with female genital organs and menstrual cycle: Secondary | ICD-10-CM

## 2010-10-28 DIAGNOSIS — N939 Abnormal uterine and vaginal bleeding, unspecified: Secondary | ICD-10-CM

## 2010-10-28 NOTE — Progress Notes (Signed)
Addended by: Aviva Signs on: 10/28/2010 11:48 AM   Modules accepted: Orders

## 2010-10-28 NOTE — Progress Notes (Signed)
Endometrial Biopsy Procedure Note  Pre-operative Diagnosis: Dysfunctional Uterine Bleeding  Post-operative Diagnosis: same  Indications: abnormal uterine bleeding  Procedure Details   Urine pregnancy test was not done as Mirena in place. The risks (including infection, bleeding, pain, and uterine perforation) and benefits of the procedure were explained to the patient and Written informed consent was obtained.  Antibiotic prophylaxis against endocarditis was not indicated.   The patient was placed in the dorsal lithotomy position.  Bimanual exam showed the uterus to be in the neutral position.  A Graves' speculum inserted in the vagina, and the cervix prepped with povidone iodine.  Endocervical curettage with a Kevorkian curette was performed.   A sharp tenaculum was applied to the anterior lip of the cervix for stabilization.  A sterile uterine sound was used to sound the uterus to a depth of 7cm.  A Pipelle endometrial aspirator was used to sample the endometrium.  Sample was sent for pathologic examination.  Condition: Stable  Complications: Pain  Plan:  The patient was advised to call for any fever or for prolonged or severe pain or bleeding. She was advised to use NSAID as needed for mild to moderate pain. She was advised to avoid vaginal intercourse for 48 hours or until the bleeding has completely stopped.

## 2010-10-28 NOTE — Patient Instructions (Signed)
Please make an appointment to see one of the Surgeons at the next available appointment.  Please read about the endometrial ablation.    You may have some bleeding for the next two days, as well as some cramping, you can take over the counter Ibuprofen for the pain.  If you have a large amount of bleeding please seek medical care.

## 2010-10-28 NOTE — Progress Notes (Signed)
  Subjective:    Patient ID: Debra [name], female    DOB: Aug 25, 1965, 45 y.o.   MRN: [id number]  HPI Presents for results of [place] s/p episode of menorrhagia since April. Just stopped last week. Last irreg bleeding sev. Yrs ago.   Has Mirena X 4 yrs. Medical issues followed by primary MD.   Review of Systems Negative       Objective:   Physical Exam  Deferred to Dr Lula Olszewski  Hgb last 13+ US showed adenomyosis     Assessment & Plan:  Discussed Adenomyosis. Rev'd conservative care, removal of IUD, ablation and hysterectomy. Pt interested in ablation and BTL. Per dr Marice Potter, needs Endometrial biopsy BX to be done by Dr Lula Olszewski today.

## 2010-11-03 ENCOUNTER — Encounter (HOSPITAL_BASED_OUTPATIENT_CLINIC_OR_DEPARTMENT_OTHER): Payer: Self-pay | Attending: Internal Medicine

## 2010-11-03 ENCOUNTER — Other Ambulatory Visit (HOSPITAL_BASED_OUTPATIENT_CLINIC_OR_DEPARTMENT_OTHER): Payer: Self-pay | Admitting: Internal Medicine

## 2010-11-03 ENCOUNTER — Ambulatory Visit (HOSPITAL_COMMUNITY)
Admission: RE | Admit: 2010-11-03 | Discharge: 2010-11-03 | Disposition: A | Discharge status: Home / Self Care | Payer: Self-pay | Source: Ambulatory Visit | Attending: Internal Medicine | Admitting: Internal Medicine

## 2010-11-03 DIAGNOSIS — L97309 Non-pressure chronic ulcer of unspecified ankle with unspecified severity: Secondary | ICD-10-CM | POA: Insufficient documentation

## 2010-11-03 DIAGNOSIS — I10 Essential (primary) hypertension: Secondary | ICD-10-CM | POA: Insufficient documentation

## 2010-11-03 DIAGNOSIS — L02619 Cutaneous abscess of unspecified foot: Secondary | ICD-10-CM | POA: Insufficient documentation

## 2010-11-03 DIAGNOSIS — B952 Enterococcus as the cause of diseases classified elsewhere: Secondary | ICD-10-CM | POA: Insufficient documentation

## 2010-11-03 DIAGNOSIS — Z79899 Other long term (current) drug therapy: Secondary | ICD-10-CM | POA: Insufficient documentation

## 2010-11-03 DIAGNOSIS — L97509 Non-pressure chronic ulcer of other part of unspecified foot with unspecified severity: Secondary | ICD-10-CM | POA: Insufficient documentation

## 2010-11-03 DIAGNOSIS — T148XXD Other injury of unspecified body region, subsequent encounter: Secondary | ICD-10-CM

## 2010-11-03 DIAGNOSIS — Z01818 Encounter for other preprocedural examination: Secondary | ICD-10-CM | POA: Insufficient documentation

## 2010-11-03 DIAGNOSIS — L03039 Cellulitis of unspecified toe: Secondary | ICD-10-CM | POA: Insufficient documentation

## 2010-11-03 DIAGNOSIS — Z794 Long term (current) use of insulin: Secondary | ICD-10-CM | POA: Insufficient documentation

## 2010-11-03 DIAGNOSIS — E1069 Type 1 diabetes mellitus with other specified complication: Secondary | ICD-10-CM | POA: Insufficient documentation

## 2010-11-03 DIAGNOSIS — X58XXXA Exposure to other specified factors, initial encounter: Secondary | ICD-10-CM | POA: Insufficient documentation

## 2010-11-03 DIAGNOSIS — Z8673 Personal history of transient ischemic attack (TIA), and cerebral infarction without residual deficits: Secondary | ICD-10-CM | POA: Insufficient documentation

## 2010-11-03 DIAGNOSIS — S8990XA Unspecified injury of unspecified lower leg, initial encounter: Secondary | ICD-10-CM | POA: Insufficient documentation

## 2010-11-03 DIAGNOSIS — E119 Type 2 diabetes mellitus without complications: Secondary | ICD-10-CM | POA: Insufficient documentation

## 2010-11-03 DIAGNOSIS — S99929A Unspecified injury of unspecified foot, initial encounter: Secondary | ICD-10-CM | POA: Insufficient documentation

## 2010-11-03 NOTE — Progress Notes (Unsigned)
[facility]  NAME:  Debra [name], Debra [name]                 ACCOUNT NO.:  [id number]  MEDICAL RECORD NO.:  [id number]      DATE OF BIRTH:  [birth year]  PHYSICIAN:  Maxwell Caul, M.D.      VISIT DATE:                                  OFFICE VISIT   Debra [name] is a 45 year old woman who has a history of type 1 diabetes on an insulin pump.  She traumatized her right great toe sometime in the middle of August and the carport hitting it against a chair.  She was seen by primary care and initially was not felt to have an infection. Subsequently, this was re-reviewed and some purulent drainage was noted coming from under the nail.  She was given a course of clindamycin which she has finished.  She also when the same fall had a wound on her right lateral leg which appears to have resolved.  Also, a small abrasion above her left lateral malleolus.  We have looked at all of these wounds today.  PAST MEDICAL HISTORY: 1. Primary hypertension. 2. Type 1 diabetes, on an insulin pump. 3. Hypertension. 4. Chronic diarrhea.  REVIEW OF SYSTEMS:  RESPIRATORY:  The patient does not complain of any shortness of breath.  CARDIAC:  Negative for chest pain.  GI:  No current complaints.  MUSCULOSKELETAL:  Still in spite of her diabetic neuropathy is able to complain of some pain in her right foot.  PHYSICAL EXAMINATION:  VITAL SIGNS:  Her temperature is 98.3, pulse 80, respirations 18, blood pressure elevated initially at 217/112. Unfortunately, I did not see this until after she has left the clinic. We will have to see if we had rechecked this. RESPIRATORY:  Clear air entry bilaterally. CARDIAC:  Heart sounds are normal.  There is no murmurs.  No signs of congestive heart failure.  Wound exam:  The area on the tip of her right great toe would actually healed over.  However, the nail was clearly nonviable and this was manually removed.  No analgesia was necessary secondary to  her coexistent neuropathy.  There was a small area just above the lateral malleolus on the left leg.  This was also debrided to a clean base.  IMPRESSION:  Right great toe wound.  This appears to have healed.  A culture of this area was done.  The nail which was clearly already avulsing was removed.  No analgesia was necessary here.  To this area, we simply recommended a topical antibiotics.  The base of the nail looks fine.  We used a Kerlix wrap with a toe sock the patient is to change this daily.  To the ankle, we applied a silver collagen dressing.  The patient will change this herself at home.  As mentioned, the culture of the right and nail bed was done, although empiric antibiotics were not started.  We will see her again in a week.          ______________________________ Maxwell Caul, M.D.     MGR/MEDQ  D:  11/03/2010  T:  11/03/2010  Job:  [id number]

## 2010-11-07 NOTE — Telephone Encounter (Signed)
Patient was seen on 8/31 for results

## 2010-11-10 ENCOUNTER — Ambulatory Visit (HOSPITAL_COMMUNITY)
Admission: RE | Admit: 2010-11-10 | Discharge: 2010-11-10 | Disposition: A | Discharge status: Home / Self Care | Payer: Self-pay | Source: Ambulatory Visit | Attending: Internal Medicine | Admitting: Internal Medicine

## 2010-11-10 ENCOUNTER — Other Ambulatory Visit (HOSPITAL_BASED_OUTPATIENT_CLINIC_OR_DEPARTMENT_OTHER): Payer: Self-pay | Admitting: Internal Medicine

## 2010-11-10 DIAGNOSIS — M79609 Pain in unspecified limb: Secondary | ICD-10-CM | POA: Insufficient documentation

## 2010-11-10 DIAGNOSIS — M79676 Pain in unspecified toe(s): Secondary | ICD-10-CM

## 2010-11-16 ENCOUNTER — Ambulatory Visit (INDEPENDENT_AMBULATORY_CARE_PROVIDER_SITE_OTHER): Payer: Self-pay | Admitting: Internal Medicine

## 2010-11-16 ENCOUNTER — Other Ambulatory Visit (INDEPENDENT_AMBULATORY_CARE_PROVIDER_SITE_OTHER): Payer: Self-pay

## 2010-11-16 ENCOUNTER — Encounter: Payer: Self-pay | Admitting: Internal Medicine

## 2010-11-16 DIAGNOSIS — K5289 Other specified noninfective gastroenteritis and colitis: Secondary | ICD-10-CM

## 2010-11-16 DIAGNOSIS — R945 Abnormal results of liver function studies: Secondary | ICD-10-CM

## 2010-11-16 DIAGNOSIS — R7989 Other specified abnormal findings of blood chemistry: Secondary | ICD-10-CM

## 2010-11-16 LAB — HEPATIC FUNCTION PANEL
ALT: 33 U/L (ref 0–35)
AST: 32 U/L (ref 0–37)
Bilirubin, Direct: 0.1 mg/dL (ref 0.0–0.3)
Total Bilirubin: 0.5 mg/dL (ref 0.3–1.2)

## 2010-11-16 MED ORDER — PREDNISONE 10 MG PO TABS: ORAL_TABLET | ORAL | Status: DC

## 2010-11-16 NOTE — Patient Instructions (Signed)
Your physician has requested that you go to the basement for the following lab work before leaving today: Hepatic Panel Dr Juanda Chance has advised that you be on a prednisone taper. The taper instructions are as follows: Take 1.5 tablets by mouth once daily x 1 week Take 1 tablet by mouth once daily x 1 week Take 0.5 tablet by mouth once daily x 1 week. Then, Discontinue! You have been scheduled for an abdominal ultrasound at [facility] (1st floor of hospital) on Friday 11/18/10 at 11:30 am. Please arrive 15 minutes prior to your appointment for registration. Make certain not to have anything to eat or drink 6 hours prior to your appointment. Should you need to reschedule your appointment, please contact radiology at [phone]. CC: Dr Allena Katz

## 2010-11-16 NOTE — Progress Notes (Signed)
Debra [name] December 12, 1965 MRN [id number]   History of Present Illness:  This is a 45 year old African American female with chronic diarrhea. She has failed multiple treatments and is currently on Questran, Imodium, Bentyl. Her recent colonoscopy in August 2012 showed internal hemorrhoids. Biopsies showed normal mucosa without evidence of colitis. Her sedimentation rate has been elevated to 48. Prior colonoscopies were completed in 1999, 2001 and 2005. She had a small polyp in 2001. She has been given a trial of Flagyl without improvement. She is an insulin dependent diabetic. Her weight is 190 pounds. It went up to 208 pounds several months ago.   Past Medical History  Diagnosis Date  . Allergy     seasonal  . Heart murmur     Birth  . Hyperlipidemia     2005  . Stroke     in 1990  . Neuromuscular disorder     Neuropathy - hands/feet  . Anxiety     1990  . Anemia     2005  . Asthma     2000  . Diabetes mellitus     type i juvenille; 1988  . Hypertension     1998  . Mental disorder     Depression 1990  . Anal fissure   . Hx of adenomatous colonic polyps   . Internal hemorrhoids 04/24/06    on colonoscopy  . Migraine   . Cataract   . GERD (gastroesophageal reflux disease)   . Staphylococcus aureus infection     toe  . Family history of malignant neoplasm of gastrointestinal tract    Past Surgical History  Procedure Date  . Cholecystectomy   . Trigger finger release     x 3  . Eye surgery   . Cesarean section   . Bilateral foot surgery     reports that she has never smoked. She has never used smokeless tobacco. She reports that she does not drink alcohol or use illicit drugs. family history includes Breast cancer in her maternal grandmother; Colon cancer in her maternal uncle; Hypertension in her father and mother; Kidney disease in her mother; Ovarian cancer in her paternal grandmother; and Prostate cancer in her maternal grandfather and paternal  grandfather. Allergies  Allergen Reactions  . Penicillins Nausea And Vomiting and Rash        Review of Systems: Denies dysphagia, odynophagia, shortness of breath or chest pain  The remainder of the 10  point ROS is negative except as outlined in H&P   Physical Exam: General appearance  Well developed, in no distress. Eyes- non icteric. HEENT nontraumatic, normocephalic. Mouth no lesions, tongue papillated, no cheilosis. Neck supple without adenopathy, thyroid not enlarged, no carotid bruits, no JVD. Lungs Clear to auscultation bilaterally. Cor normal S1 normal S2, regular rhythm , no murmur,  quiet precordium. Abdomen tubular and abdomen with normal active bowel sounds. Mild diffuse tenderness. No palpable mass or ascites. Rectal: Not repeated. Extremities no pedal edema. Skin no lesions. Neurological alert and oriented x 3. Psychological normal mood and affect.  Assessment and Plan:  Problem #1 Chronic diarrhea not responsive to standard medications. There was no evidence of inflammatory bowel disease on random colon biopsies from recent colonoscopy. We will give her a trial of 15  mg a day for one week to be tapered down to 10 mg for a week and subsequently 5 mg for a week. We will consider collecting a 24-hour urine for HIAA.  Problem #2 Abnormal liver function tests. Patient is  status post remote cholecystectomy approximately 6 years ago. We will repeat liver function tests today and obtain an upper abdominal ultrasound.   11/16/2010 Lina Sar

## 2010-11-17 ENCOUNTER — Telehealth: Payer: Self-pay | Admitting: *Deleted

## 2010-11-17 NOTE — Telephone Encounter (Signed)
Left a message for patient to call me. 

## 2010-11-17 NOTE — Telephone Encounter (Signed)
Patient given results as per Dr. Brodie 

## 2010-11-17 NOTE — Telephone Encounter (Signed)
Message copied by Daphine Deutscher on Thu Nov 17, 2010  9:14 AM ------      Message from: Hart Carwin      Created: Wed Nov 16, 2010  5:57 PM       Please call pt with normal results

## 2010-11-17 NOTE — Telephone Encounter (Signed)
Message copied by Debra [name] on Thu Nov 17, 2010  4:51 PM ------      Message from: Hart Carwin      Created: Wed Nov 16, 2010  5:57 PM       Please call pt with normal results

## 2010-11-18 ENCOUNTER — Ambulatory Visit (HOSPITAL_COMMUNITY)
Admission: RE | Admit: 2010-11-18 | Discharge: 2010-11-18 | Disposition: A | Discharge status: Home / Self Care | Payer: Self-pay | Source: Ambulatory Visit | Attending: Internal Medicine | Admitting: Internal Medicine

## 2010-11-18 DIAGNOSIS — R945 Abnormal results of liver function studies: Secondary | ICD-10-CM

## 2010-11-18 DIAGNOSIS — Z9089 Acquired absence of other organs: Secondary | ICD-10-CM | POA: Insufficient documentation

## 2010-11-18 DIAGNOSIS — R7989 Other specified abnormal findings of blood chemistry: Secondary | ICD-10-CM | POA: Insufficient documentation

## 2010-11-18 DIAGNOSIS — K7689 Other specified diseases of liver: Secondary | ICD-10-CM | POA: Insufficient documentation

## 2010-11-18 LAB — COMPREHENSIVE METABOLIC PANEL
AST: 22
Alkaline Phosphatase: 124 — ABNORMAL HIGH
CO2: 20
Chloride: 96
Creatinine, Ser: 1.61 — ABNORMAL HIGH
GFR calc Af Amer: 43 — ABNORMAL LOW
GFR calc non Af Amer: 35 — ABNORMAL LOW
Potassium: 4.8
Total Bilirubin: 1.3 — ABNORMAL HIGH

## 2010-11-18 LAB — URINALYSIS, ROUTINE W REFLEX MICROSCOPIC
Leukocytes, UA: NEGATIVE
Protein, ur: NEGATIVE
Urobilinogen, UA: 0.2

## 2010-11-18 LAB — CBC
MCHC: 33.1
MCV: 88.9
Platelets: 317

## 2010-11-18 LAB — DIFFERENTIAL
Basophils Relative: 0
Eosinophils Absolute: 0
Neutrophils Relative %: 87 — ABNORMAL HIGH

## 2010-11-18 LAB — URINE MICROSCOPIC-ADD ON

## 2010-11-21 ENCOUNTER — Telehealth: Payer: Self-pay | Admitting: *Deleted

## 2010-11-21 NOTE — Telephone Encounter (Signed)
Patient notified of results as per Dr. Brodie. 

## 2010-11-21 NOTE — Telephone Encounter (Signed)
Message copied by Daphine Deutscher on Mon Nov 21, 2010  8:52 AM ------      Message from: Hart Carwin      Created: Fri Nov 18, 2010  8:11 PM       Please call pt with results of the ultrasound which shows fatty liver, probably  Result of D.M. And being elevated cholesterol and weight. Nothing to explain diarrhea.

## 2010-11-30 NOTE — Progress Notes (Signed)
Addended by: Dorie Rank E on: 11/30/2010 12:58 PM   Modules accepted: Orders

## 2010-12-01 ENCOUNTER — Encounter (HOSPITAL_BASED_OUTPATIENT_CLINIC_OR_DEPARTMENT_OTHER): Payer: Self-pay | Attending: Internal Medicine

## 2010-12-01 DIAGNOSIS — L97309 Non-pressure chronic ulcer of unspecified ankle with unspecified severity: Secondary | ICD-10-CM | POA: Insufficient documentation

## 2010-12-01 DIAGNOSIS — L97509 Non-pressure chronic ulcer of other part of unspecified foot with unspecified severity: Secondary | ICD-10-CM | POA: Insufficient documentation

## 2010-12-01 DIAGNOSIS — E1069 Type 1 diabetes mellitus with other specified complication: Secondary | ICD-10-CM | POA: Insufficient documentation

## 2010-12-01 DIAGNOSIS — Z79899 Other long term (current) drug therapy: Secondary | ICD-10-CM | POA: Insufficient documentation

## 2010-12-01 DIAGNOSIS — Z8673 Personal history of transient ischemic attack (TIA), and cerebral infarction without residual deficits: Secondary | ICD-10-CM | POA: Insufficient documentation

## 2010-12-01 DIAGNOSIS — L03039 Cellulitis of unspecified toe: Secondary | ICD-10-CM | POA: Insufficient documentation

## 2010-12-01 DIAGNOSIS — L02619 Cutaneous abscess of unspecified foot: Secondary | ICD-10-CM | POA: Insufficient documentation

## 2010-12-01 DIAGNOSIS — I10 Essential (primary) hypertension: Secondary | ICD-10-CM | POA: Insufficient documentation

## 2010-12-01 DIAGNOSIS — B952 Enterococcus as the cause of diseases classified elsewhere: Secondary | ICD-10-CM | POA: Insufficient documentation

## 2010-12-01 DIAGNOSIS — Z794 Long term (current) use of insulin: Secondary | ICD-10-CM | POA: Insufficient documentation

## 2010-12-08 ENCOUNTER — Ambulatory Visit (INDEPENDENT_AMBULATORY_CARE_PROVIDER_SITE_OTHER): Payer: Self-pay | Admitting: Obstetrics & Gynecology

## 2010-12-08 ENCOUNTER — Encounter: Payer: Self-pay | Admitting: Obstetrics & Gynecology

## 2010-12-08 DIAGNOSIS — Z01818 Encounter for other preprocedural examination: Secondary | ICD-10-CM

## 2010-12-08 DIAGNOSIS — N938 Other specified abnormal uterine and vaginal bleeding: Secondary | ICD-10-CM

## 2010-12-08 DIAGNOSIS — N949 Unspecified condition associated with female genital organs and menstrual cycle: Secondary | ICD-10-CM

## 2010-12-08 DIAGNOSIS — Z23 Encounter for immunization: Secondary | ICD-10-CM

## 2010-12-08 LAB — RAPID URINE DRUG SCREEN, HOSP PERFORMED
Amphetamines: NOT DETECTED
Benzodiazepines: NOT DETECTED
Cocaine: NOT DETECTED

## 2010-12-08 LAB — COMPREHENSIVE METABOLIC PANEL
ALT: 30
AST: 25
Albumin: 3.6
CO2: 24
Calcium: 9.1
Creatinine, Ser: 0.79
GFR calc Af Amer: >60
Sodium: 135
Total Protein: 7.2

## 2010-12-08 LAB — DIFFERENTIAL
Eosinophils Absolute: 0.3
Eosinophils Relative: 4
Lymphocytes Relative: 31
Lymphs Abs: 2.7
Monocytes Relative: 8

## 2010-12-08 LAB — CBC
MCHC: 33.3
Platelets: 280
RBC: 4.55
RDW: 15 — ABNORMAL HIGH

## 2010-12-08 MED ORDER — INFLUENZA VIRUS VACC SPLIT PF IM SUSP
0.5000 mL | Freq: Once | INTRAMUSCULAR | Status: AC
  Administered 2010-12-08: 0.5 mL via INTRAMUSCULAR

## 2010-12-08 NOTE — Progress Notes (Signed)
History:  45 yo G2P1102 here for surgical management of  DUB.  No other GYN concerns.  Patient also reports undesired fertility, interested in sterilization.  The following portions of the patient's history were reviewed and updated as appropriate: allergies, current medications, past family history, past medical history, past social history, past surgical history and problem list.   Objective:  Physical Exam Blood pressure 215/111, pulse 94, temperature 97.6 F (36.4 C), temperature source Oral, height 5' 4.5" (1.638 m), weight 193 lb 11.2 oz (87.862 kg), last menstrual period 12/04/2010. Abd: Soft/NT/ND Pelvic: Normal appearing external genitalia; normal appearing vaginal mucosa and cervix.  Small uterus, no palpable masses or adnexal tenderness.  Endometrium, biopsy (10/28/10) INACTIVE ENDOMETRIUM ADMIXED WITH ABUNDANT FIBRIN AND BLOOD. NO HYPERPLASIA OR CARCINOMA.  TRANSABDOMINAL AND TRANSVAGINAL ULTRASOUND OF PELVIS (09/27/10) Uterus: 8.8 x 6.4 x 3.8 cm. Inhomogeneous myometrium with overall mildly globular appearance. No measurable focal abnormality.  Endometrium: IUD appropriately located within the fundal/body endometrial canal. Endometrium not measurable separately.  Right ovary: 3.8 x 2.2 x 2.1 cm  Left ovary: 2.5 x 1.4 x 1.2 cm  No ovarian or adnexal mass or other abnormality. Other findings: No free fluid  IMPRESSION: Mildly inhomogeneous globular uterus, which may suggest adenomyosis. This could be confirmed with pelvic MRI with contrast if needed. No abnormality to otherwise explain the history of vaginal bleeding. Adenomyosis may cause abnormal bleeding. IUD appropriately located.    Assessment & Plan:  Discussed management options for DUB including oral Provera, Depo Provera, Mirena IUD, endometrial ablation (Novasure/Hydrothermal Ablation/Thermachoice) or hysterectomy as definitive surgical management.  Discussed risks and benefits of each method. Patient desires Novasure  endometrial ablation.  She also desires bilateral tubal sterilization.  The risks of surgery were discussed in detail with the patient including but not limited to: bleeding which may require transfusion or reoperation; infection which may require prolonged hospitalization or re-hospitalization and antibiotic therapy; injury to bowel, bladder, ureters and major vessels or other surrounding organs; need for additional procedures including laparoscopy/laparotomy; and other postoperative or anesthesia complications.   Additional risks of the tubal ligation was  discussed with patient including but not limited to: risk of regret, permanence of method, failure risk of 0.5-1% with increased risk of ectopic gestation if pregnancy occurs.  The patient also understands the alternative treatment options which were discussed in full.  All questions were answered.  She was told that she will be contacted by our surgical scheduler regarding the time and date of her surgery; routine preoperative instructions of having nothing to eat or drink after midnight on the day prior to surgery and also coming to the hospital 1 1/2 hours prior to her time of surgery were also emphasized.  She was told she may be called for a preoperative appointment about a week prior to surgery and will be given further preoperative instructions at that visit.  Novasure patient pamphlet given to patient.  Patient with severe range BP; did not take afternoon medication.  Discussed consequences of high BP including stroke.  Patient understands these risks.  Advised that she should contact her PCP to get it under control prior to surgery.     Patient received flu vaccine today in clinic.  Will continue Mirena IUD for now.  Bleeding precautions reviewed.  Planned surgery: Laparoscopic bilateral tubal sterilization, Novasure endometrial abltaion, D&C

## 2010-12-08 NOTE — Progress Notes (Deleted)
Subjective:     Patient ID: Debra [name], female   DOB: February 06, 1966, 45 y.o.   MRN: [id number]  HPI   Review of Systems     Objective:   Physical Exam     Assessment:     ***    Plan:     ***

## 2010-12-08 NOTE — Patient Instructions (Signed)
Sterilization For Women Sterilization is a surgical procedure. This surgery permanently prevents pregnancy in women. This can be done by tying (with or without cutting) the fallopian tubes or burning the tubes closed (tubal ligation). Tubal ligation blocks the tubes and prevents the egg from being fertilized by the sperm. Sterilization can be done by removing the ovaries that produce the egg (castration) as well. Sterilization is considered safe with very rare complications. It does not affect menstrual periods, sexual desire or performance.  Since sterilization is considered permanent, you should not do it until you are sure you do not want to have more children. You and your partner should fully agree to have the procedure. Your decision to have the procedure should not be made when you are in a stressful situation. This can include a loss of a pregnancy, illness or death of a spouse or divorce. There are other means of preventing unwanted pregnancies that can be used until you are completely sure you want to be sterilized. Sterilization does not protect against sexually transmitted disease. Women who had a sterilization procedure and want it reversed must know that it requires an expensive and major operation. The reversal may not be successful and has a high rate of tubal (ectopic) pregnancy that can be dangerous and require surgery. THERE ARE SEVERAL WAYS TO PERFORM A TUBAL STERILIZATION:  Laparoscopy. The abdomen is filled with a gas to see the pelvic organs. Then, a tube with a light attached is inserted into the abdomen through 2 small incisions. The fallopian tubes are blocked with a ring, clip or electrocautery to burn closed the tubes. Then, the gas is released and the small incisions are closed.   Hysteroscopy. A tube with a light is inserted in the vagina, through the cervix and then into the uterus. A spring-like instrument is inserted into the opening of the fallopian tubes. The spring causes  scaring and blocks the tubes. Other forms of contraception should be used for three months at which time an X-ray is done to be sure the tubes are blocked.   Minilaparotomy. This is done right after giving birth. A small incision is made under the belly button and the tubes are exposed. The tubes can then be burned, tied and/or cut.   Tubal ligation can be done during a Cesarean section.   Castration is a surgical procedure that removes both ovaries.  Tubal sterilization should be discussed with your caregiver to answer any concerns you or your partner might have. This meeting will help to decide for sure if the operation is safe for you and which procedure is the best one for you. You can change your mind and cancel the surgery at any time even if you have signed the consent forms to have it done. HOME CARE INSTRUCTIONS  Follow your caregivers instructions regarding diet, rest, work, social and sexual activities and follow up appointments.   Shoulder pain is common following a laparoscopy. The pain may be relieved by lying down flat.   Only take over-the-counter or prescription medicines for pain, discomfort or fever as directed by your caregiver.   You may use lozenges for throat discomfort.   Keep the incisions covered to prevent infection.  SEEK IMMEDIATE MEDICAL CARE IF:  You develop a temperature of 101, or as your caregiver suggests.   You become dizzy or faint.   You start to feel sick to your stomach (nausea) or throw up (vomit).   You develop abdominal pain not relieved with over-the-counter  medications.   You have redness and puffiness (swelling) of the cut (incision).   You see pus draining from the incision.   You miss a menstrual period.  Document Released: 08/02/2007  [facility] Patient Information 2011 [place], [place].  Endometrial Ablation Endometrial ablation removes the lining of the uterus (endometrium). It is usually a same day, outpatient treatment. Ablation  helps avoid major surgery (such as a hysterectomy). A hysterectomy is removal of the cervix and uterus. Endometrial ablation has less risk and complications, has a shorter recovery period and is less expensive. After endometrial ablation, most women will have little or no menstrual bleeding. You may not keep your fertility. Pregnancy is no longer likely after this procedure but if you are pre-menopausal, you still need to use a reliable method of birth control following the procedure because pregnancy can occur. REASONS TO HAVE THE PROCEDURE MAY INCLUDE:  Heavy periods.   Bleeding that is causing anemia.   Anovulatory bleeding, very irregular, bleeding.   Bleeding submucous fibroids (on the lining inside the uterus) if they are smaller than 3 centimeters.  REASONS NOT TO HAVE THE PROCEDURE MAY INCLUDE:  You wish to have more children.   You have a pre-cancerous or cancerous problem. The cause of any abnormal bleeding must be diagnosed before having the procedure.   You have pain coming from the uterus.   You have a submucus fibroid larger than 3 centimeters.   You recently had a baby.   You recently had an infection in the uterus.   You have a severe retro-flexed, tipped uterus and cannot insert the instrument to do the ablation.   You had a Cesarean section or deep major surgery on the uterus.   The inner cavity of the uterus is too large for the endometrial ablation instrument.  BEFORE PROCEDURE:  The lining of the uterus must be tested to make sure there is no pre-cancerous or cancer cells present.   Medications may be given to make the lining of the uterus thinner.   Ultrasound may be used to evaluate the size and look for abnormalities of the uterus.   Future pregnancy is not desired.  RISKS AND COMPLICATIONS  Perforation of the uterus.   Bleeding.   Infection of the uterus, bladder or vagina.   Injury to surrounding organs.   Cutting the cervix.   An air bubble  to the lung (air embolus).   Pregnancy following the procedure.   Failure of the procedure to help the problem requiring hysterectomy.   Decreased ability to diagnose cancer in the lining of the uterus.  PROCEDURE There are different ways to destroy the lining of the uterus.   Resectoscope - radio frequency-alternating electric current is the most common one used.   Cryotherapy - freezing the lining of the uterus.   Heated Free Liquid - heated salt (saline) solution inserted into the uterus.   Microwave - uses high energy microwaves in the uterus.   Thermal Balloon - a catheter with a balloon tip is inserted into the uterus and filled with heated fluid.  Your caregiver will talk with you about the method used in this clinic. They will also instruct you on the pros and cons of the procedure. Endometrial ablation is performed along with a procedure called operative hysteroscopy. A narrow viewing tube is inserted through the birth canal (vagina) and through the cervix into the uterus. A tiny camera attached to the viewing tube (hysteroscope) allows the uterine cavity to be shown on  a TV monitor during surgery. Your uterus is filled with a harmless liquid to make the procedure easier. The lining of the uterus is then removed. The lining can also be removed with a resectoscope which allows your surgeon to cut away the lining of the uterus under direct vision. Usually, you will be able to go home within an hour after the procedure. HOME CARE INSTRUCTIONS  Do not drive for 24 hours.   No tampons, douching or intercourse for 2 weeks or until your caregiver approves.   Rest at home for 24 to 48 hours. You may then resume normal activities unless told differently by your caregiver.   Take your temperature two times a day for 4 days, and record it.   Take any medications your caregiver has ordered, as directed.   Use some form of contraception if you are pre-menopausal and do not want to get  pregnant.  Bleeding after the procedure is normal. It varies from light spotting and mildly watery to bloody discharge for 4 to 6 weeks. You may also have mild cramping. Only take over-the-counter or prescription medicines for pain, discomfort, or fever as directed by your caregiver. Do not use aspirin, as this may aggravate bleeding. Frequent urination during the first 24 hours is normal. You will not know how effective your surgery is until at least 3 months after the surgery. SEEK IMMEDIATE MEDICAL CARE IF:  Bleeding is heavier than a normal menstrual cycle.   An oral temperature above 101 develops.   You have increasing cramps or pains not relieved with medication or develop belly (abdominal) pain which does not seem to be related to the same area of earlier cramping and pain.   You are light headed, weak or have fainting episodes.   You develop pain in the shoulder strap areas.   You have chest or leg pain.   You have abnormal vaginal discharge.   You have painful urination.  Document Released: 12/24/2003 Document Re-Released: 05/10/2009 [facility] Patient Information 2011 [place], [place].

## 2010-12-21 ENCOUNTER — Encounter (HOSPITAL_COMMUNITY)
Admission: RE | Admit: 2010-12-21 | Discharge: 2010-12-21 | Disposition: A | Discharge status: Home / Self Care | Payer: Self-pay | Source: Ambulatory Visit | Attending: Obstetrics & Gynecology | Admitting: Obstetrics & Gynecology

## 2010-12-21 ENCOUNTER — Other Ambulatory Visit: Payer: Self-pay

## 2010-12-21 ENCOUNTER — Encounter (HOSPITAL_COMMUNITY): Payer: Self-pay

## 2010-12-21 LAB — BASIC METABOLIC PANEL
BUN: 15 mg/dL (ref 6–23)
CO2: 28 mEq/L (ref 19–32)
Calcium: 9.2 mg/dL (ref 8.4–10.5)
Chloride: 99 mEq/L (ref 96–112)
Creatinine, Ser: 0.74 mg/dL (ref 0.50–1.10)
GFR calc Af Amer: >90 mL/min (ref >90)

## 2010-12-21 LAB — CBC
HCT: 39.4 % (ref 36.0–46.0)
MCHC: 32.7 g/dL (ref 30.0–36.0)
MCV: 89.3 fL (ref 78.0–100.0)
Platelets: 354 10*3/uL (ref 150–400)
RDW: 14.3 % (ref 11.5–15.5)
WBC: 8 10*3/uL (ref 4.0–10.5)

## 2010-12-21 LAB — SURGICAL PCR SCREEN
MRSA, PCR: NEGATIVE
Staphylococcus aureus: NEGATIVE

## 2010-12-21 NOTE — Patient Instructions (Addendum)
   Your procedure is scheduled on:  Thursday, Nov. 1st  Enter through the Main Entrance of [facility] at:  8:00am Pick up the phone at the desk and dial [phone] and inform us of your arrival.  Please call this number if you have any problems the morning of surgery: [phone]  Remember: Do not eat food after midnight:  Wednesday Do not drink clear liquids after:  Wednesday  Take these medicines the morning of surgery with a SIP OF WATER: Take all medications that you normally take in the morning  Do not wear jewelry, make-up, or FINGER nail polish Do not wear lotions, powders, or perfumes.  You may wear deodorant. Do not shave 48 hours prior to surgery. Do not bring valuables to the hospital.  Your surgery is scheduled for 1 and a half hours.  Please talk to your endocronologist about management of your insulin pump the morning of surgery. Please get a note from your doctor telling us how they want [place] to manage your insulin pump.    Patients discharged on the day of surgery will not be allowed to drive home.   Name and phone number of your driver:  Remember to use your hibiclens as instructed.Please shower with 1/2 bottle the evening before your surgery and the other 1/2 bottle the morning of surgery.

## 2010-12-21 NOTE — Anesthesia Preprocedure Evaluation (Addendum)
Anesthesia Evaluation  Patient identified by MRN, date of birth, ID band Patient awake  General Assessment Comment  Reviewed: Allergy & Precautions, H&P , NPO status , Patient's Chart, lab work & pertinent test results, reviewed documented beta blocker date and time   Airway Mallampati: I      Dental No notable dental hx.    Pulmonary asthma    Pulmonary exam normal       Cardiovascular hypertension, Pt. on medications and Pt. on home beta blockers     Neuro/Psych PSYCHIATRIC DISORDERS Anxiety    GI/Hepatic Neg liver ROS  GERD Medicated  Endo/Other  Diabetes mellitus-, Poorly Controlled, Type 1  Renal/GU negative Renal ROS  Genitourinary negative   Musculoskeletal negative musculoskeletal ROS (+)   Abdominal Normal abdominal exam  (+)   Peds negative pediatric ROS (+)  Hematology negative hematology ROS (+)   Anesthesia Other Findings   Reproductive/Obstetrics negative OB ROS                           Anesthesia Physical Anesthesia Plan  ASA: III  Anesthesia Plan: General   Post-op Pain Management:    Induction: Intravenous  Airway Management Planned: Oral ETT  Additional Equipment:   Intra-op Plan:   Post-operative Plan: Extubation in OR  Informed Consent: I have reviewed the patients History and Physical, chart, labs and discussed the procedure including the risks, benefits and alternatives for the proposed anesthesia with the patient or authorized representative who has indicated his/her understanding and acceptance.   Dental advisory given  Plan Discussed with:   Anesthesia Plan Comments: (1. Pt will take all of her meds including beta blocker DOS 2. It is expected that her endocrinologist will send a note instructing [place] in the management of her insulin pump 3. Pt will bring and use her inhaler DOS)       Anesthesia Quick Evaluation

## 2010-12-29 ENCOUNTER — Encounter (HOSPITAL_COMMUNITY): Payer: Self-pay | Admitting: Anesthesiology

## 2010-12-29 ENCOUNTER — Encounter (HOSPITAL_COMMUNITY): Payer: Self-pay | Admitting: *Deleted

## 2010-12-29 ENCOUNTER — Ambulatory Visit (HOSPITAL_COMMUNITY)
Admission: RE | Admit: 2010-12-29 | Discharge: 2010-12-29 | Disposition: A | Discharge status: Home / Self Care | Payer: Self-pay | Source: Ambulatory Visit | Attending: Obstetrics & Gynecology | Admitting: Obstetrics & Gynecology

## 2010-12-29 ENCOUNTER — Ambulatory Visit (HOSPITAL_COMMUNITY): Payer: Self-pay | Admitting: Anesthesiology

## 2010-12-29 ENCOUNTER — Encounter (HOSPITAL_COMMUNITY)
Admission: RE | Disposition: A | Discharge status: Home / Self Care | Payer: Self-pay | Source: Ambulatory Visit | Attending: Obstetrics & Gynecology

## 2010-12-29 ENCOUNTER — Other Ambulatory Visit: Payer: Self-pay | Admitting: Obstetrics & Gynecology

## 2010-12-29 DIAGNOSIS — N949 Unspecified condition associated with female genital organs and menstrual cycle: Secondary | ICD-10-CM | POA: Insufficient documentation

## 2010-12-29 DIAGNOSIS — Z302 Encounter for sterilization: Secondary | ICD-10-CM | POA: Insufficient documentation

## 2010-12-29 DIAGNOSIS — N938 Other specified abnormal uterine and vaginal bleeding: Secondary | ICD-10-CM | POA: Insufficient documentation

## 2010-12-29 DIAGNOSIS — Z01812 Encounter for preprocedural laboratory examination: Secondary | ICD-10-CM | POA: Insufficient documentation

## 2010-12-29 DIAGNOSIS — Z01818 Encounter for other preprocedural examination: Secondary | ICD-10-CM | POA: Insufficient documentation

## 2010-12-29 DIAGNOSIS — S99929A Unspecified injury of unspecified foot, initial encounter: Secondary | ICD-10-CM

## 2010-12-29 DIAGNOSIS — Z30432 Encounter for removal of intrauterine contraceptive device: Secondary | ICD-10-CM | POA: Insufficient documentation

## 2010-12-29 HISTORY — PX: IUD REMOVAL: SHX5392

## 2010-12-29 HISTORY — PX: HYSTEROSCOPY: SHX211

## 2010-12-29 HISTORY — PX: LAPAROSCOPIC TUBAL LIGATION: SHX1937

## 2010-12-29 HISTORY — PX: DILATION AND CURETTAGE OF UTERUS: SHX78

## 2010-12-29 LAB — GLUCOSE, CAPILLARY: Glucose-Capillary: 169 mg/dL — ABNORMAL HIGH (ref 70–99)

## 2010-12-29 LAB — PREGNANCY, URINE: Preg Test, Ur: NEGATIVE

## 2010-12-29 SURGERY — LIGATION, FALLOPIAN TUBE, LAPAROSCOPIC
Anesthesia: General | Site: Vagina | Wound class: Clean

## 2010-12-29 MED ORDER — BUPIVACAINE HCL (PF) 0.25 % IJ SOLN
INTRAMUSCULAR | Status: DC | PRN
  Administered 2010-12-29: 15 mL

## 2010-12-29 MED ORDER — PANTOPRAZOLE SODIUM 40 MG PO TBEC
DELAYED_RELEASE_TABLET | ORAL | Status: AC
  Administered 2010-12-29: 40 mg via ORAL
  Filled 2010-12-29: qty 1

## 2010-12-29 MED ORDER — KETOROLAC TROMETHAMINE 30 MG/ML IJ SOLN: 15.0000 mg | Freq: Once | INTRAMUSCULAR | Status: DC | PRN

## 2010-12-29 MED ORDER — ONDANSETRON HCL 4 MG/2ML IJ SOLN
INTRAMUSCULAR | Status: AC
  Administered 2010-12-29: 4 mg via INTRAVENOUS
  Filled 2010-12-29: qty 2

## 2010-12-29 MED ORDER — DOCUSATE SODIUM 100 MG PO CAPS: 100.0000 mg | ORAL_CAPSULE | Freq: Two times a day (BID) | ORAL | Status: AC | PRN

## 2010-12-29 MED ORDER — ONDANSETRON HCL 4 MG/2ML IJ SOLN
INTRAMUSCULAR | Status: DC | PRN
  Administered 2010-12-29: 4 mg via INTRAVENOUS

## 2010-12-29 MED ORDER — LIDOCAINE HCL (CARDIAC) 20 MG/ML IV SOLN
INTRAVENOUS | Status: AC
  Filled 2010-12-29: qty 5

## 2010-12-29 MED ORDER — ROCURONIUM BROMIDE 50 MG/5ML IV SOLN
INTRAVENOUS | Status: AC
  Filled 2010-12-29: qty 1

## 2010-12-29 MED ORDER — PROPOFOL 10 MG/ML IV EMUL
INTRAVENOUS | Status: AC
  Filled 2010-12-29: qty 20

## 2010-12-29 MED ORDER — NEOSTIGMINE METHYLSULFATE 1 MG/ML IJ SOLN
INTRAMUSCULAR | Status: AC
  Filled 2010-12-29: qty 10

## 2010-12-29 MED ORDER — FENTANYL CITRATE 0.05 MG/ML IJ SOLN
INTRAMUSCULAR | Status: DC | PRN
  Administered 2010-12-29: 100 ug via INTRAVENOUS
  Administered 2010-12-29 (×2): 50 ug via INTRAVENOUS
  Administered 2010-12-29: 150 ug via INTRAVENOUS

## 2010-12-29 MED ORDER — OXYCODONE-ACETAMINOPHEN 5-325 MG PO TABS
1.0000 | ORAL_TABLET | Freq: Four times a day (QID) | ORAL | Status: AC | PRN
Start: 2010-12-29 — End: 2011-01-08

## 2010-12-29 MED ORDER — FENTANYL CITRATE 0.05 MG/ML IJ SOLN
INTRAMUSCULAR | Status: AC
  Filled 2010-12-29: qty 5

## 2010-12-29 MED ORDER — ONDANSETRON HCL 4 MG/2ML IJ SOLN
INTRAMUSCULAR | Status: AC
  Filled 2010-12-29: qty 2

## 2010-12-29 MED ORDER — KETOROLAC TROMETHAMINE 30 MG/ML IJ SOLN
INTRAMUSCULAR | Status: DC | PRN
  Administered 2010-12-29: 30 mg via INTRAVENOUS

## 2010-12-29 MED ORDER — HYDROMORPHONE HCL 1 MG/ML IJ SOLN
INTRAMUSCULAR | Status: AC
  Administered 2010-12-29: 0.5 mg via INTRAVENOUS
  Filled 2010-12-29: qty 1

## 2010-12-29 MED ORDER — BUPIVACAINE HCL 0.5 % IJ SOLN
INTRAMUSCULAR | Status: DC | PRN
  Administered 2010-12-29: 10 mL

## 2010-12-29 MED ORDER — MIDAZOLAM HCL 2 MG/2ML IJ SOLN
INTRAMUSCULAR | Status: AC
  Filled 2010-12-29: qty 2

## 2010-12-29 MED ORDER — PROPOFOL 10 MG/ML IV EMUL
INTRAVENOUS | Status: DC | PRN
  Administered 2010-12-29: 150 mg via INTRAVENOUS
  Administered 2010-12-29: 50 mg via INTRAVENOUS

## 2010-12-29 MED ORDER — ROCURONIUM BROMIDE 100 MG/10ML IV SOLN
INTRAVENOUS | Status: DC | PRN
  Administered 2010-12-29: 5 mg via INTRAVENOUS

## 2010-12-29 MED ORDER — MEPERIDINE HCL 25 MG/ML IJ SOLN: 6.2500 mg | INTRAMUSCULAR | Status: DC | PRN

## 2010-12-29 MED ORDER — GLYCOPYRROLATE 0.2 MG/ML IJ SOLN
INTRAMUSCULAR | Status: DC | PRN
  Administered 2010-12-29: 0.1 mg via INTRAVENOUS

## 2010-12-29 MED ORDER — HYDROMORPHONE HCL PF 1 MG/ML IJ SOLN
0.2500 mg | INTRAMUSCULAR | Status: DC | PRN
  Administered 2010-12-29 (×4): 0.5 mg via INTRAVENOUS

## 2010-12-29 MED ORDER — PROMETHAZINE HCL 25 MG/ML IJ SOLN
6.2500 mg | INTRAMUSCULAR | Status: DC | PRN
  Administered 2010-12-29: 6.25 mg via INTRAVENOUS

## 2010-12-29 MED ORDER — LACTATED RINGERS IR SOLN
Status: DC | PRN
  Administered 2010-12-29: 3000 mL

## 2010-12-29 MED ORDER — PROMETHAZINE HCL 25 MG/ML IJ SOLN
INTRAMUSCULAR | Status: AC
  Administered 2010-12-29: 6.25 mg via INTRAVENOUS
  Filled 2010-12-29: qty 1

## 2010-12-29 MED ORDER — IBUPROFEN 200 MG PO TABS: 600.0000 mg | ORAL_TABLET | Freq: Four times a day (QID) | ORAL | Status: DC | PRN

## 2010-12-29 MED ORDER — SUCCINYLCHOLINE CHLORIDE 20 MG/ML IJ SOLN
INTRAMUSCULAR | Status: DC | PRN
  Administered 2010-12-29: 80 mg via INTRAVENOUS

## 2010-12-29 MED ORDER — ONDANSETRON HCL 4 MG/2ML IJ SOLN
4.0000 mg | Freq: Once | INTRAMUSCULAR | Status: AC | PRN
  Administered 2010-12-29: 4 mg via INTRAVENOUS

## 2010-12-29 MED ORDER — FENTANYL CITRATE 0.05 MG/ML IJ SOLN
INTRAMUSCULAR | Status: AC
  Filled 2010-12-29: qty 2

## 2010-12-29 MED ORDER — PANTOPRAZOLE SODIUM 40 MG PO TBEC
40.0000 mg | DELAYED_RELEASE_TABLET | Freq: Once | ORAL | Status: AC
  Administered 2010-12-29: 40 mg via ORAL

## 2010-12-29 MED ORDER — MIDAZOLAM HCL 5 MG/5ML IJ SOLN
INTRAMUSCULAR | Status: DC | PRN
  Administered 2010-12-29: 2 mg via INTRAVENOUS

## 2010-12-29 MED ORDER — GLYCOPYRROLATE 0.2 MG/ML IJ SOLN
INTRAMUSCULAR | Status: AC
  Filled 2010-12-29: qty 2

## 2010-12-29 MED ORDER — SUCCINYLCHOLINE CHLORIDE 20 MG/ML IJ SOLN
INTRAMUSCULAR | Status: AC
  Filled 2010-12-29: qty 1

## 2010-12-29 MED ORDER — LACTATED RINGERS IV SOLN
INTRAVENOUS | Status: DC
  Administered 2010-12-29 (×3): via INTRAVENOUS

## 2010-12-29 SURGICAL SUPPLY — 23 items
ABLATOR ENDOMETRIAL BIPOLAR (ABLATOR) ×6 IMPLANT
ADH SKN CLS APL DERMABOND .7 (GAUZE/BANDAGES/DRESSINGS) ×5
CATH ROBINSON RED A/P 16FR (CATHETERS) ×8 IMPLANT
CHLORAPREP W/TINT 26ML (MISCELLANEOUS) ×6 IMPLANT
CLIP FILSHIE TUBAL LIGA STRL (Clip) ×6 IMPLANT
CLOTH BEACON ORANGE TIMEOUT ST (SAFETY) ×6 IMPLANT
CONTAINER PREFILL 10% NBF 60ML (FORM) IMPLANT
DERMABOND ADVANCED (GAUZE/BANDAGES/DRESSINGS) ×1
DERMABOND ADVANCED .7 DNX12 (GAUZE/BANDAGES/DRESSINGS) ×1 IMPLANT
GLOVE BIO SURGEON STRL SZ7 (GLOVE) ×6 IMPLANT
GLOVE BIOGEL PI IND STRL 7.0 (GLOVE) ×10 IMPLANT
GLOVE BIOGEL PI INDICATOR 7.0 (GLOVE) ×2
GOWN PREVENTION PLUS LG XLONG (DISPOSABLE) ×12 IMPLANT
NDL INSUFFLATION 14GA 120MM (NEEDLE) ×4 IMPLANT
NEEDLE INSUFFLATION 14GA 120MM (NEEDLE) ×6 IMPLANT
PACK HYSTEROSCOPY LF (CUSTOM PROCEDURE TRAY) ×6 IMPLANT
PACK LAPAROSCOPY BASIN (CUSTOM PROCEDURE TRAY) ×6 IMPLANT
SET GENESYS HTA PROCERVA (MISCELLANEOUS) ×2 IMPLANT
SUT VIC AB 3-0 X1 27 (SUTURE) ×6 IMPLANT
SUT VICRYL 0 UR6 27IN ABS (SUTURE) ×12 IMPLANT
TOWEL OR 17X24 6PK STRL BLUE (TOWEL DISPOSABLE) ×12 IMPLANT
TROCAR Z-THREAD FIOS 11X100 BL (TROCAR) ×6 IMPLANT
WATER STERILE IRR 1000ML POUR (IV SOLUTION) ×6 IMPLANT

## 2010-12-29 NOTE — Anesthesia Procedure Notes (Signed)
Procedure Name: Intubation Date/Time: 12/29/2010 9:54 AM Performed by: Karleen Dolphin Pre-anesthesia Checklist: Patient identified, Patient being monitored, Timeout performed, Emergency Drugs available and Suction available Patient Re-evaluated:Patient Re-evaluated prior to inductionOxygen Delivery Method: Circle System Utilized Preoxygenation: Pre-oxygenation with 100% oxygen Intubation Type: IV induction Ventilation: Mask ventilation without difficulty Laryngoscope Size: Mac and 3 Grade View: Grade I Tube type: Oral Tube size: 7.0 mm Number of attempts: 1 Airway Equipment and Method: stylet Placement Confirmation: ETT inserted through vocal cords under direct vision,  breath sounds checked- equal and bilateral and positive ETCO2 Secured at: 20 cm Tube secured with: Tape Dental Injury: Teeth and Oropharynx as per pre-operative assessment

## 2010-12-29 NOTE — Anesthesia Postprocedure Evaluation (Signed)
Anesthesia Post Note  Patient: Debra [name]  Procedure(s) Performed:  LAPAROSCOPIC TUBAL LIGATION; INTRAUTERINE DEVICE (IUD) REMOVAL; HYSTEROSCOPY WITH HYDROTHERMAL ABLATION; DILATATION AND CURETTAGE (D&C)  Anesthesia type: General  Patient location: PACU  Post pain: Pain level controlled  Post assessment: Post-op Vital signs reviewed  Last Vitals:  Filed Vitals:   12/29/10 1300  BP: 166/96  Pulse: 89  Temp:   Resp: 16    Post vital signs: Reviewed  Level of consciousness: sedated  Complications: No apparent anesthesia complicationsfj

## 2010-12-29 NOTE — Op Note (Signed)
Debra [name] 12/29/2010  PREOPERATIVE DIAGNOSIS:  Undesired fertility, dysfunctional uterine bleeding  POSTOPERATIVE DIAGNOSIS:  Undesired fertility, dysfunctional uterine bleeding  PROCEDURE:  Laparoscopic Bilateral Tubal Sterilization using Filshie Clips, Dilation and Curettage, Removal of Mirena IUD, Hysteroscopic Hydrothermal Endometrial Ablation   ANESTHESIA:  General endotracheal  COMPLICATIONS:  None immediate.  ESTIMATED BLOOD LOSS:  Less than 20 ml.  FLUIDS: 1800 ml LR.  URINE OUTPUT:  50 ml of clear urine.  INDICATIONS: 45 y.o. [id number]  with undesired fertility, desires permanent sterilization. Other reversible forms of contraception were discussed with patient; she declines all other modalities.  Risks of procedure discussed with patient including permanence of method, bleeding, infection, injury to surrounding organs and need for additional procedures including laparotomy, risk of regret.  Failure risk of 0.5-1% with increased risk of ectopic gestation if pregnancy occurs was also discussed with patient.   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  Risks of surgery were discussed with the patient including but not limited to: bleeding which may require transfusion; infection which may require antibiotics; injury to uterus leading to risk of injury to surrounding intraperitoneal organs, burn injury to vagina or other organs, need for additional procedures including laparoscopy or laparotomy, and other postoperative/anesthesia complications. Patient was informed that there is a high likelihood of success of controlling her symptoms; however less than 5% of patients may require further intervention.  Written informed consent was obtained.      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  Normal ostia, fallopian tubes, and ovaries.  Normal upper  abdomen.  TECHNIQUE:  The patient was taken to the operating room where general anesthesia was obtained without difficulty.  She was then placed in the dorsal lithotomy position and prepared and draped in sterile fashion.  After an adequate timeout was performed, a bivalved speculum was then placed in the patient's vagina, and the anterior lip of cervix grasped with the single-tooth tenaculum.  The uterine manipulator was then advanced into the uterus.  The speculum was removed from the vagina.  Attention was then turned to the patient's abdomen where a 10-mm skin incision was made on the umbilical fold.  The Veress needle was carefully introduced into the peritoneal cavity through the abdominal wall.  Intraperitoneal placement was confirmed by drop in intraabdominal pressure with insufflation of carbon dioxide gas.  Adequate pneumoperitoneum was obtained, and the 10-mm trocar and sleeve were then advanced without difficulty into the abdomen where intraabdominal placement was confirmed by the operative laparoscope. A survey of the patient's pelvis and abdomen revealed entirely normal anatomy.  The fallopian tubes were observed and found to be normal in appearance. The Filshie clip applicator was placed through the operative port, and a Filshie clip was placed on the right fallopian tube ,about 2 cm from the cornual attachment, with care given to incorporate the underlying mesosalpinx.  A similar process was carried out on the contralateral side allowing for bilateral tubal sterilization.   Good hemostasis was noted overall.  Local analgesia was drizzled on both operative sites.The instruments were then removed from the patient's abdomen and the fascial incision was repaired with 0 Vicryl, and the skin was closed with Dermabond.  The uterine manipulator was removed from the vagina without complications.  The speculum was then placed in the patient's vagina and the Mirena IUD strings were visualized and grasped  with  ring forceps.  The IUD was removed in its entirety.   A paracervical block using 20 ml of 0.5% Marcaine was then administered.  The cervix was sounded to 11 cm and dilated manually with metal dilators to accommodate the hydrothermal ablation hysteroscopic apparatus.  Once the cervix was dilated, a sharp curettage was then performed to obtain a moderate amount of endometrial curettings.  The hysteroscope was inserted under direct visualization using normal saline as a suspension medium.  The uterine cavity was carefully examined, and the large septum was recognized as mentioned above.  Both ostia were recognized, and diffusely proliferative endometrium was noted.   The hydrothermal ablation was then carried out as per protocol.   Complete ablation of the endometrium was observed and the hysteroscope was removed under direct visualization. .  No complications were observed.  The tenaculum was removed from the anterior lip of the cervix, and the vaginal speculum was removed after noting good hemostasis.   Sponge, lap, and needle counts were correct times two. The patient tolerated the procedure well and was taken to the recovery area awake, extubated and in stable condition.  The patient will be discharged to home as per PACU criteria.  Routine postoperative instructions given.  She was prescribed Percocet, Ibuprofen and Colace.  She will follow up in the clinic on 02/02/11 at 2 pm for postoperative evaluation.

## 2010-12-29 NOTE — H&P (Signed)
I have examined Debra [name] preoperatively, and have identified no interval change in the medical condition or plan of care since the history and physical of record on 12/08/10.  Donisha Hoch A 12/29/2010 9:21 AM

## 2010-12-29 NOTE — Transfer of Care (Signed)
Immediate Anesthesia Transfer of Care Note  Patient: Debra [name]  Procedure(s) Performed:  LAPAROSCOPIC TUBAL LIGATION; INTRAUTERINE DEVICE (IUD) REMOVAL; HYSTEROSCOPY WITH HYDROTHERMAL ABLATION; DILATATION AND CURETTAGE (D&C)  Patient Location: PACU  Anesthesia Type: General  Level of Consciousness: awake, alert  and oriented  Airway & Oxygen Therapy: Patient Spontanous Breathing and Patient connected to nasal cannula oxygen  Post-op Assessment: Report given to PACU RN and Post -op Vital signs reviewed and stable  Post vital signs: Reviewed and stable  Complications: No apparent anesthesia complications

## 2011-01-02 ENCOUNTER — Emergency Department (HOSPITAL_COMMUNITY)
Admission: EM | Admit: 2011-01-02 | Discharge: 2011-01-03 | Disposition: A | Discharge status: Home / Self Care | Payer: Self-pay | Attending: Emergency Medicine | Admitting: Emergency Medicine

## 2011-01-02 ENCOUNTER — Emergency Department (INDEPENDENT_AMBULATORY_CARE_PROVIDER_SITE_OTHER)
Admission: EM | Admit: 2011-01-02 | Discharge: 2011-01-02 | Disposition: A | Discharge status: Hospice | Payer: Self-pay | Source: Home / Self Care | Attending: Family Medicine | Admitting: Family Medicine

## 2011-01-02 ENCOUNTER — Other Ambulatory Visit: Payer: Self-pay

## 2011-01-02 ENCOUNTER — Emergency Department (HOSPITAL_COMMUNITY): Payer: Self-pay

## 2011-01-02 ENCOUNTER — Encounter (HOSPITAL_COMMUNITY): Payer: Self-pay | Admitting: Obstetrics & Gynecology

## 2011-01-02 ENCOUNTER — Telehealth: Payer: Self-pay | Admitting: Internal Medicine

## 2011-01-02 ENCOUNTER — Encounter (HOSPITAL_COMMUNITY): Payer: Self-pay | Admitting: *Deleted

## 2011-01-02 DIAGNOSIS — I10 Essential (primary) hypertension: Secondary | ICD-10-CM | POA: Insufficient documentation

## 2011-01-02 DIAGNOSIS — H532 Diplopia: Secondary | ICD-10-CM | POA: Insufficient documentation

## 2011-01-02 DIAGNOSIS — H5710 Ocular pain, unspecified eye: Secondary | ICD-10-CM | POA: Insufficient documentation

## 2011-01-02 DIAGNOSIS — H1130 Conjunctival hemorrhage, unspecified eye: Secondary | ICD-10-CM | POA: Insufficient documentation

## 2011-01-02 DIAGNOSIS — X58XXXA Exposure to other specified factors, initial encounter: Secondary | ICD-10-CM | POA: Insufficient documentation

## 2011-01-02 DIAGNOSIS — Z794 Long term (current) use of insulin: Secondary | ICD-10-CM | POA: Insufficient documentation

## 2011-01-02 DIAGNOSIS — Z79899 Other long term (current) drug therapy: Secondary | ICD-10-CM | POA: Insufficient documentation

## 2011-01-02 DIAGNOSIS — R51 Headache: Secondary | ICD-10-CM | POA: Insufficient documentation

## 2011-01-02 DIAGNOSIS — H53149 Visual discomfort, unspecified: Secondary | ICD-10-CM | POA: Insufficient documentation

## 2011-01-02 DIAGNOSIS — J45909 Unspecified asthma, uncomplicated: Secondary | ICD-10-CM | POA: Insufficient documentation

## 2011-01-02 DIAGNOSIS — H538 Other visual disturbances: Secondary | ICD-10-CM | POA: Insufficient documentation

## 2011-01-02 DIAGNOSIS — E785 Hyperlipidemia, unspecified: Secondary | ICD-10-CM | POA: Insufficient documentation

## 2011-01-02 DIAGNOSIS — S0510XA Contusion of eyeball and orbital tissues, unspecified eye, initial encounter: Secondary | ICD-10-CM | POA: Insufficient documentation

## 2011-01-02 DIAGNOSIS — K219 Gastro-esophageal reflux disease without esophagitis: Secondary | ICD-10-CM | POA: Insufficient documentation

## 2011-01-02 DIAGNOSIS — H5789 Other specified disorders of eye and adnexa: Secondary | ICD-10-CM | POA: Insufficient documentation

## 2011-01-02 DIAGNOSIS — E109 Type 1 diabetes mellitus without complications: Secondary | ICD-10-CM | POA: Insufficient documentation

## 2011-01-02 LAB — CBC
HCT: 40.8 % (ref 36.0–46.0)
MCH: 29.7 pg (ref 26.0–34.0)
MCV: 87.2 fL (ref 78.0–100.0)
Platelets: 316 10*3/uL (ref 150–400)
RBC: 4.68 MIL/uL (ref 3.87–5.11)

## 2011-01-02 LAB — BASIC METABOLIC PANEL
CO2: 27 mEq/L (ref 19–32)
Calcium: 9.8 mg/dL (ref 8.4–10.5)
Glucose, Bld: 49 mg/dL — ABNORMAL LOW (ref 70–99)
Sodium: 141 mEq/L (ref 135–145)

## 2011-01-02 LAB — GLUCOSE, CAPILLARY: Glucose-Capillary: 60 mg/dL — ABNORMAL LOW (ref 70–99)

## 2011-01-02 MED ORDER — TETRACAINE HCL 0.5 % OP SOLN
1.0000 [drp] | Freq: Once | OPHTHALMIC | Status: DC
  Filled 2011-01-02: qty 2

## 2011-01-02 MED ORDER — DIPHENHYDRAMINE HCL 50 MG/ML IJ SOLN
25.0000 mg | Freq: Once | INTRAMUSCULAR | Status: DC
  Filled 2011-01-02: qty 1

## 2011-01-02 MED ORDER — METOCLOPRAMIDE HCL 5 MG/ML IJ SOLN
10.0000 mg | Freq: Once | INTRAMUSCULAR | Status: AC
  Administered 2011-01-03: 10 mg via INTRAVENOUS
  Filled 2011-01-02: qty 2

## 2011-01-02 MED ORDER — LISINOPRIL 40 MG PO TABS
40.0000 mg | ORAL_TABLET | Freq: Once | ORAL | Status: AC
  Administered 2011-01-03: 40 mg via ORAL
  Filled 2011-01-02: qty 1

## 2011-01-02 MED ORDER — DEXAMETHASONE SODIUM PHOSPHATE 10 MG/ML IJ SOLN: 10.0000 mg | Freq: Once | INTRAMUSCULAR | Status: DC

## 2011-01-02 MED ORDER — CLONIDINE HCL 0.1 MG PO TABS
0.2000 mg | ORAL_TABLET | Freq: Once | ORAL | Status: AC
  Administered 2011-01-03: 0.2 mg via ORAL
  Filled 2011-01-02 (×2): qty 1

## 2011-01-02 MED ORDER — LABETALOL HCL 200 MG PO TABS
200.0000 mg | ORAL_TABLET | Freq: Once | ORAL | Status: AC
  Administered 2011-01-03: 200 mg via ORAL
  Filled 2011-01-02: qty 1

## 2011-01-02 MED ORDER — CEFAZOLIN SODIUM 1-5 GM-% IV SOLN: 1.0000 g | Freq: Once | INTRAVENOUS | Status: DC

## 2011-01-02 NOTE — ED Notes (Signed)
Per EMS:  Pt a [facility] transfer.  Pt has an eye injury-was watching TV, had sudden onset of blurred vision and pain, states that she looked in the mirror and had blood in her (R) eye.  Denies injury.  States that she also felt hypoglycemic-turned on her insulin pump and now feels better.  Pt HTN, hx of same.  Vitals stable.  20ga (R) hand.

## 2011-01-02 NOTE — ED Notes (Signed)
Pt retriaged for hypoglycemic episode shaky,dizzy and feeling faint.iv started and pt to transfer to er by ems

## 2011-01-02 NOTE — ED Provider Notes (Addendum)
History     CSN: [id number] Arrival date & time: 01/02/2011  4:59 PM   First MD Initiated Contact with Patient 01/02/11 1749      Chief Complaint  Patient presents with  . Eye Problem    (Consider location/radiation/quality/duration/timing/severity/associated sxs/prior treatment) Patient is a 45 y.o. female presenting with eye problem. The history is provided by the patient.  Eye Problem  This is a new problem. The current episode started yesterday (sudden sharp pain in eye, noticed blood , not like h/o retinal hemorrhages.). The problem occurs constantly. The problem has not changed since onset.There is pain in the right eye. There was no injury mechanism. There is no history of trauma to the eye. Associated symptoms include eye redness. Pertinent negatives include no blurred vision, no decreased vision, no discharge and no photophobia.    Past Medical History  Diagnosis Date  . Allergy     seasonal  . Heart murmur     Birth  . Hyperlipidemia     2005  . Stroke     in 1990  . Neuromuscular disorder     Neuropathy - hands/feet  . Anxiety     1990  . Anemia     2005  . Asthma     2000  . Diabetes mellitus     type i juvenille; 1988  . Hypertension     1998  . Mental disorder     Depression 1990  . Anal fissure   . Hx of adenomatous colonic polyps   . Internal hemorrhoids 04/24/06    on colonoscopy  . Migraine   . Cataract   . GERD (gastroesophageal reflux disease)   . Staphylococcus aureus infection     toe  . Family history of malignant neoplasm of gastrointestinal tract     Past Surgical History  Procedure Date  . Cholecystectomy   . Trigger finger release     x 3  . Eye surgery   . Cesarean section   . Bilateral foot surgery   . Laparoscopic tubal ligation 12/29/2010    Procedure: LAPAROSCOPIC TUBAL LIGATION;  Surgeon: Tereso Newcomer, MD;  Location: [facility];  Service: Gynecology;  Laterality: Bilateral;  . Iud removal 12/29/2010    Procedure:  INTRAUTERINE DEVICE (IUD) REMOVAL;  Surgeon: Tereso Newcomer, MD;  Location: [facility];  Service: Gynecology;  Laterality: N/A;  . Hysteroscopy 12/29/2010    Procedure: HYSTEROSCOPY WITH HYDROTHERMAL ABLATION;  Surgeon: Tereso Newcomer, MD;  Location: [facility];  Service: Gynecology;;  . Dilation and curettage of uterus 12/29/2010    Procedure: DILATATION AND CURETTAGE (D&C);  Surgeon: Tereso Newcomer, MD;  Location: [facility];  Service: Gynecology;;    Family History  Problem Relation Age of Onset  . Hypertension Mother   . Kidney disease Mother   . Hypertension Father   . Breast cancer Maternal Grandmother   . Prostate cancer Maternal Grandfather   . Ovarian cancer Paternal Grandmother   . Prostate cancer Paternal Grandfather   . Colon cancer Maternal Uncle     History  Substance Use Topics  . Smoking status: Never Smoker   . Smokeless tobacco: Never Used  . Alcohol Use: Not on file    OB History    Grav Para Term Preterm Abortions TAB SAB Ect Mult Living   2 2 1 1  0 0 0 0 0 2      Review of Systems  Constitutional: Negative.   Eyes: Positive for pain and redness.  Negative for blurred vision, photophobia, discharge and visual disturbance.  Cardiovascular: Negative.   Neurological: Negative.     Allergies  Penicillins  Home Medications   Current Outpatient Rx  Name Route Sig Dispense Refill  . ALBUTEROL SULFATE HFA 108 (90 BASE) MCG/ACT IN AERS Inhalation Inhale 2 puffs into the lungs every 6 (six) hours as needed. For asthma    . AMLODIPINE BESYLATE 10 MG PO TABS Oral Take 1 tablet (10 mg total) by mouth daily. 30 tablet 11  . ASPIRIN 81 MG PO TABS Oral Take 160 mg by mouth daily.    . ATORVASTATIN CALCIUM 10 MG PO TABS Oral Take 1 tablet (10 mg total) by mouth daily. 30 tablet 11  . WAVESENSE PRESTO W/DEVICE KIT  Check CBG 3 times daily. 1 each 0    Please dispense strips for glucose check.  . CLONIDINE HCL 0.3 MG PO TABS Oral Take 0.1 mg by mouth 2 (two) times daily.      Marland Kitchen DICYCLOMINE HCL 20 MG PO TABS  1 tablet every 6 hours as needed for cramping abdominal pain. 120 tablet 5  . DIPHENOXYLATE-ATROPINE 2.5-0.025 MG PO TABS Oral Take 1 tablet by mouth 4 (four) times daily as needed for diarrhea/loose stools. 100 tablet 1  . GABAPENTIN 400 MG PO CAPS Oral Take 1 capsule (400 mg total) by mouth 3 (three) times daily. 90 capsule 12    Faxed to [facility]--new directions tid--removed taper d ...  . GLUCOSE BLOOD VI STRP  Use as instructed 100 each 12    Check blood sugar before every meals. That will be ...  . HYDROCHLOROTHIAZIDE 25 MG PO TABS Oral Take 1 tablet (25 mg total) by mouth daily. 30 tablet 3  . IBUPROFEN 200 MG PO TABS Oral Take 3 tablets (600 mg total) by mouth every 6 (six) hours as needed for pain. 30 tablet 2  . INSULIN GLARGINE 100 UNIT/ML [place] SOLN Subcutaneous Inject 100 Units into the skin at bedtime. 30 mL 12  . INSULIN LISPRO (HUMAN) 100 UNIT/ML [place] SOLN Subcutaneous Inject 20 Units into the skin 3 (three) times daily before meals.    . INSULIN LISPRO (HUMAN) 100 UNIT/ML [place] SOLN Subcutaneous Inject 0-15 Units into the skin 3 (three) times daily before meals. Per sliding scale along with 20 units.    . ISOSORBIDE MONONITRATE ER 30 MG PO TB24 Oral Take 30 mg by mouth daily.    Marland Kitchen LABETALOL HCL 200 MG PO TABS Oral Take 200 mg by mouth 2 (two) times daily.      Marland Kitchen METFORMIN HCL 500 MG PO TABS Oral Take 1 tablet (500 mg total) by mouth daily with breakfast. 90 tablet 3  . NITROGLYCERIN 0.4 MG SL SUBL Sublingual Place 0.4 mg under the tongue every 5 (five) minutes as needed.    Marland Kitchen OXYMETAZOLINE HCL 0.05 % NA SOLN Nasal Place 3 sprays into the nose 2 (two) times daily.      . QUINAPRIL HCL 20 MG PO TABS Oral Take 1 tablet (20 mg total) by mouth 2 (two) times daily. 60 tablet 11  . SPIRONOLACTONE 100 MG PO TABS Oral Take 1 tablet (100 mg total) by mouth daily. 30 tablet 11    BP 198/103  Pulse 98  Temp(Src) 98.2 F (36.8 C) (Oral)  Resp 16  SpO2 100%  LMP  12/04/2010  Physical Exam  Nursing note and vitals reviewed. Constitutional: She appears well-developed and well-nourished.  HENT:  Head: Normocephalic and atraumatic.  Eyes:  EOM are normal. Pupils are equal, round, and reactive to light. Right conjunctiva has a hemorrhage.    Cardiovascular: Normal rate, regular rhythm, normal heart sounds and normal pulses.   Neurological: She is alert. She has normal strength. No cranial nerve deficit or sensory deficit.    ED Course  Procedures (including critical care time)  Labs Reviewed  GLUCOSE, CAPILLARY - Abnormal; Notable for the following:    Glucose-Capillary 60 (*)    All other components within normal limits  LAB REPORT - SCANNED   No results found.   1. Hypertension   2. Subconjunctival hemorrhage, non-traumatic       MDM  Sub conj hemorrhage prob from severe hbp, on mult meds, needs bp eval.        Barkley Bruns, MD 01/02/11 1816  Barkley Bruns, MD 01/02/11 Rickey Primus  Linna Hoff, MD 06/23/11 [phone]

## 2011-01-02 NOTE — ED Provider Notes (Signed)
History     CSN: [id number] Arrival date & time: 01/02/2011  7:30 PM   First MD Initiated Contact with Patient 01/02/11 2106      Chief Complaint  Patient presents with  . Hypoglycemia  . Eye Injury    (Consider location/radiation/quality/duration/timing/severity/associated sxs/prior treatment) Patient is a 45 y.o. female presenting with headaches and eye problem. The history is provided by the patient.  Headache  This is a new problem. The current episode started 3 to 5 hours ago. The problem occurs constantly. The problem has not changed since onset.Associated with: high blood pressure (noted to be above 200 systolic at urgent care) The pain is located in the bilateral (behind/in orbits) region. The quality of the pain is described as throbbing. The pain is moderate. The pain does not radiate. Pertinent negatives include no fever, no chest pressure, no near-syncope, no palpitations, no shortness of breath, no nausea and no vomiting. She has tried nothing for the symptoms.  Eye Problem  This is a new problem. The current episode started 12 to 24 hours ago. The problem occurs constantly. The problem has not changed since onset.There is pain in both eyes. There was no injury mechanism. The pain is mild. There is no history of trauma to the eye. She does not wear contacts. Associated symptoms include blurred vision, decreased vision, double vision, photophobia and eye redness. Pertinent negatives include no foreign body sensation, no nausea, no vomiting and no weakness. She has tried nothing for the symptoms.    Past Medical History  Diagnosis Date  . Allergy     seasonal  . Heart murmur     Birth  . Hyperlipidemia     2005  . Stroke     in 1990  . Neuromuscular disorder     Neuropathy - hands/feet  . Anxiety     1990  . Anemia     2005  . Asthma     2000  . Diabetes mellitus     type i juvenille; 1988  . Hypertension     1998  . Mental disorder     Depression 1990  . Anal  fissure   . Hx of adenomatous colonic polyps   . Internal hemorrhoids 04/24/06    on colonoscopy  . Migraine   . Cataract   . GERD (gastroesophageal reflux disease)   . Staphylococcus aureus infection     toe  . Family history of malignant neoplasm of gastrointestinal tract     Past Surgical History  Procedure Date  . Cholecystectomy   . Trigger finger release     x 3  . Eye surgery   . Cesarean section   . Bilateral foot surgery   . Laparoscopic tubal ligation 12/29/2010    Procedure: LAPAROSCOPIC TUBAL LIGATION;  Surgeon: Tereso Newcomer, MD;  Location: [facility];  Service: Gynecology;  Laterality: Bilateral;  . Iud removal 12/29/2010    Procedure: INTRAUTERINE DEVICE (IUD) REMOVAL;  Surgeon: Tereso Newcomer, MD;  Location: [facility];  Service: Gynecology;  Laterality: N/A;  . Hysteroscopy 12/29/2010    Procedure: HYSTEROSCOPY WITH HYDROTHERMAL ABLATION;  Surgeon: Tereso Newcomer, MD;  Location: [facility];  Service: Gynecology;;  . Dilation and curettage of uterus 12/29/2010    Procedure: DILATATION AND CURETTAGE (D&C);  Surgeon: Tereso Newcomer, MD;  Location: [facility];  Service: Gynecology;;    Family History  Problem Relation Age of Onset  . Hypertension Mother   . Kidney disease Mother   .  Hypertension Father   . Breast cancer Maternal Grandmother   . Prostate cancer Maternal Grandfather   . Ovarian cancer Paternal Grandmother   . Prostate cancer Paternal Grandfather   . Colon cancer Maternal Uncle     History  Substance Use Topics  . Smoking status: Never Smoker   . Smokeless tobacco: Never Used  . Alcohol Use: Not on file    OB History    Grav Para Term Preterm Abortions TAB SAB Ect Mult Living   2 2 1 1  0 0 0 0 0 2      Review of Systems  Constitutional: Negative for fever.  Eyes: Positive for blurred vision, double vision, photophobia, redness and visual disturbance.  Respiratory: Negative for cough and shortness of breath.   Cardiovascular: Negative for chest  pain, palpitations and near-syncope.  Gastrointestinal: Negative for nausea, vomiting, abdominal pain and diarrhea.  Neurological: Positive for headaches. Negative for facial asymmetry and weakness.  All other systems reviewed and are negative.    Allergies  Penicillins  Home Medications   Current Outpatient Rx  Name Route Sig Dispense Refill  . ALBUTEROL SULFATE HFA 108 (90 BASE) MCG/ACT IN AERS Inhalation Inhale 2 puffs into the lungs every 6 (six) hours as needed. For asthma    . WAVESENSE PRESTO W/DEVICE KIT  Check CBG 3 times daily. 1 each 0    Please dispense strips for glucose check.  . CLONIDINE HCL 0.3 MG PO TABS Oral Take 0.3 mg by mouth 4 (four) times daily.      Marland Kitchen DICYCLOMINE HCL 20 MG PO TABS  1 tablet every 6 hours as needed for cramping abdominal pain. 120 tablet 5  . DIPHENOXYLATE-ATROPINE 2.5-0.025 MG PO TABS Oral Take 1 tablet by mouth 4 (four) times daily as needed for diarrhea/loose stools. 100 tablet 1  . DOCUSATE SODIUM 100 MG PO CAPS Oral Take 1 capsule (100 mg total) by mouth 2 (two) times daily as needed for constipation. 30 capsule 2  . FUROSEMIDE 20 MG PO TABS Oral Take 2 tablets (40 mg total) by mouth 2 (two) times daily. 60 tablet 11  . GABAPENTIN 400 MG PO CAPS Oral Take 1 capsule (400 mg total) by mouth 3 (three) times daily. 90 capsule 12    Faxed to [facility]--new directions tid--removed taper d ...  . IBUPROFEN 200 MG PO TABS Oral Take 3 tablets (600 mg total) by mouth every 6 (six) hours as needed for pain. 30 tablet 2  . INSULIN LISPRO (HUMAN) 100 UNIT/ML [place] SOLN  Uses up to 200 units per day via insulin pump.     Marland Kitchen LABETALOL HCL 200 MG PO TABS Oral Take 200 mg by mouth 2 (two) times daily.      Marland Kitchen LISINOPRIL 40 MG PO TABS Oral Take 40 mg by mouth daily.      . OXYCODONE-ACETAMINOPHEN 5-325 MG PO TABS Oral Take 1-2 tablets by mouth every 6 (six) hours as needed for pain. 30 tablet 0  . OXYMETAZOLINE HCL 0.05 % NA SOLN Nasal Place 3 sprays into the nose 2  (two) times daily.      Marland Kitchen SPIRONOLACTONE 50 MG PO TABS Oral Take 1 tablet (50 mg total) by mouth daily. 30 tablet 11    BP 182/92  Pulse 95  Temp(Src) 98.5 F (36.9 C) (Oral)  Resp 18  SpO2 100%  LMP 12/04/2010  Physical Exam  Nursing note and vitals reviewed. Constitutional: She is oriented to person, place, and time. She appears  well-developed and well-nourished. No distress.  HENT:  Head: Normocephalic and atraumatic.  Eyes: EOM are normal. Pupils are equal, round, and reactive to light. Right conjunctiva has a hemorrhage. Right eye exhibits normal extraocular motion and no nystagmus. Left eye exhibits normal extraocular motion and no nystagmus.       Eye pressure bilaterally noted to be less than 10.  Neck: Normal range of motion.  Cardiovascular: Normal rate and normal heart sounds.   Pulmonary/Chest: Effort normal and breath sounds normal. No respiratory distress.  Abdominal: Soft. She exhibits no distension. There is tenderness. There is no rebound and no guarding.       Pt with recent surgery (TL and ablation)  Musculoskeletal: Normal range of motion.  Neurological: She is alert and oriented to person, place, and time. No cranial nerve deficit. She exhibits normal muscle tone. Coordination normal. GCS eye subscore is 4. GCS verbal subscore is 5. GCS motor subscore is 6. She displays no Babinski's sign on the right side. She displays no Babinski's sign on the left side.  Skin: Skin is warm and dry.    ED Course  Procedures (including critical care time)  Labs Reviewed  GLUCOSE, CAPILLARY - Abnormal; Notable for the following:    Glucose-Capillary 132 (*)    All other components within normal limits  BASIC METABOLIC PANEL - Abnormal; Notable for the following:    Potassium 3.4 (*)    Glucose, Bld 49 (*)    All other components within normal limits  CBC - Abnormal; Notable for the following:    WBC 11.2 (*)    All other components within normal limits  GLUCOSE,  CAPILLARY - Abnormal; Notable for the following:    Glucose-Capillary 64 (*)    All other components within normal limits   Ct Head Wo Contrast  01/02/2011  *RADIOLOGY REPORT*  Clinical Data: Headache, hypertension, blood in eye.  CT HEAD WITHOUT CONTRAST  Technique:  Contiguous axial images were obtained from the base of the skull through the vertex without contrast.  Comparison: 12/09/2006  Findings: There is no evidence for acute hemorrhage, hydrocephalus, mass lesion, or abnormal extra-axial fluid collection.  No definite CT evidence for acute infarction.  The visualized paranasal sinuses and mastoid air cells are predominately clear.  Visualized portions of the globes are normal.  No retrobulbar hematoma.  IMPRESSION: No acute intracranial abnormality.  Original Report Authenticated By: Waneta Martins, M.D.  EKG: normal EKG, normal sinus rhythm, unchanged from previous tracings.    1. Headache   2. Hypertension, uncontrolled       MDM  Pt with hard to control BP (as evidenced by the large number of medicines she is on) presents with elevated BP and headache. Pt also mentions blurry vision (with glasses and both eyes, 20/20; either eye independent is 20/50 and very blurry/black beyond that). Initial concern for CVA, so will get head CT. Will also work pt up for HTN with Cr and EKG. Pt currently without chest pain.  CT head negative for any acute findings. After speaking with Dr. Redmond Baseman (ophtho), he will see patient in clinic tomorrow for exam to look further into the blurry vision pt is having. Will give partial migraine cocktail (benadryl and reglan) and evening BP meds as pt still with headache. Will not give full dose (0.8) of clonidine as this seems very high and we are unable to verify it. PT without any other neuro symptoms and remainder of elevated BP workup unremarkable. Will observe pt  for about 30 minutes after meds and then will be discharged home to follow up in AM. Patient  advised to return to ED for any worsening of symptoms.        Daleen Bo Resident 01/03/11 [phone]

## 2011-01-02 NOTE — ED Notes (Signed)
As pt being d/c'd to er began to feel shaky and faint.cbg 60.gave candy,peanut butter and sprite.no loc or vomiting at this time.will cont to monitor.

## 2011-01-02 NOTE — ED Notes (Signed)
Pt here with poss right eye hemorraghic r/t elevated bp 201/104.sx started sudden last night.ocass sharp pains noted.denies dizziness or vomiting.h/a reported.pt has hx htn,diabetes and states she had similar sx related to bp in the past.

## 2011-01-02 NOTE — ED Notes (Signed)
Checked pts blood sugar and it was 64 pt given grape juice and peanut butter crackers.

## 2011-01-02 NOTE — ED Notes (Signed)
See triage note.  Pt noted to be ambulating to bathroom without difficulty.  Vision remains blurry.  States that she felt a pop and then noted the blood in her eye.

## 2011-01-02 NOTE — Telephone Encounter (Signed)
Pt called to report "bloody eye" that is also painful.  She notes sx began last night after having a headache.  Pt reports no changes or loss of vision.  Advised pt to go to the ER immediately for further evaluation of eye pain.  Pt expresses understanding and plans to head to nearby ER or urgent care.

## 2011-01-03 MED ORDER — DIPHENHYDRAMINE HCL 25 MG PO CAPS
ORAL_CAPSULE | ORAL | Status: AC
  Filled 2011-01-03: qty 1

## 2011-01-03 MED ORDER — DIPHENHYDRAMINE HCL 25 MG PO CAPS
25.0000 mg | ORAL_CAPSULE | Freq: Once | ORAL | Status: AC
  Administered 2011-01-03: 25 mg via ORAL

## 2011-01-09 ENCOUNTER — Ambulatory Visit (INDEPENDENT_AMBULATORY_CARE_PROVIDER_SITE_OTHER): Payer: Self-pay | Admitting: Family Medicine

## 2011-01-09 ENCOUNTER — Encounter: Payer: Self-pay | Admitting: Family Medicine

## 2011-01-09 VITALS — BP 168/106 | HR 88 | Temp 96.6°F | Ht 64.5 in | Wt 192.5 lb

## 2011-01-09 DIAGNOSIS — Z5189 Encounter for other specified aftercare: Secondary | ICD-10-CM

## 2011-01-09 DIAGNOSIS — Z09 Encounter for follow-up examination after completed treatment for conditions other than malignant neoplasm: Secondary | ICD-10-CM

## 2011-01-09 NOTE — Progress Notes (Signed)
  Subjective:    Patient ID: Debra [name], female    DOB: December 22, 1965, 45 y.o.   MRN: [id number]  HPI Patient seen for wound check.  Had BTL done 11/1.  Patient was worried about her umbilical wound.  Believes that it has been opening up.  Denies pain, drainage, erythema, fevers, chills, nausea, vomiting.  Does have headache.     Review of Systems     Objective:   Physical Exam  Constitutional: She appears well-developed and well-nourished.  Abdominal:       Abdomen soft, non-tender.  dermabond elevated.  No erythema, drainage.  Incision intact without evidence of dehiscence.  Skin: Skin is warm and dry.   Vitals reviewed.       Assessment & Plan:  1.  Wound check. No evidence of dehiscence.  Reassurance given.

## 2011-01-13 ENCOUNTER — Encounter (INDEPENDENT_AMBULATORY_CARE_PROVIDER_SITE_OTHER): Payer: Self-pay | Admitting: Ophthalmology

## 2011-01-18 ENCOUNTER — Encounter: Payer: Self-pay | Admitting: Internal Medicine

## 2011-01-18 ENCOUNTER — Ambulatory Visit (INDEPENDENT_AMBULATORY_CARE_PROVIDER_SITE_OTHER): Payer: Self-pay | Admitting: Internal Medicine

## 2011-01-18 VITALS — BP 205/101 | HR 91 | Temp 98.6°F | Ht 64.5 in | Wt 195.8 lb

## 2011-01-18 DIAGNOSIS — I10 Essential (primary) hypertension: Secondary | ICD-10-CM

## 2011-01-18 DIAGNOSIS — N925 Other specified irregular menstruation: Secondary | ICD-10-CM

## 2011-01-18 DIAGNOSIS — N949 Unspecified condition associated with female genital organs and menstrual cycle: Secondary | ICD-10-CM

## 2011-01-18 DIAGNOSIS — E109 Type 1 diabetes mellitus without complications: Secondary | ICD-10-CM

## 2011-01-18 DIAGNOSIS — N938 Other specified abnormal uterine and vaginal bleeding: Secondary | ICD-10-CM

## 2011-01-18 LAB — POCT GLYCOSYLATED HEMOGLOBIN (HGB A1C): Hemoglobin A1C: 9.5

## 2011-01-18 LAB — GLUCOSE, CAPILLARY: Glucose-Capillary: 339 mg/dL — ABNORMAL HIGH (ref 70–99)

## 2011-01-18 MED ORDER — GLUCOSE BLOOD VI STRP: ORAL_STRIP | Status: DC

## 2011-01-18 MED ORDER — INSULIN ASPART 100 UNIT/ML ~~LOC~~ SOLN: 30.0000 [IU] | Freq: Three times a day (TID) | SUBCUTANEOUS | Status: DC

## 2011-01-18 MED ORDER — INSULIN GLARGINE 100 UNIT/ML ~~LOC~~ SOLN: 100.0000 [IU] | Freq: Every day | SUBCUTANEOUS | Status: DC

## 2011-01-18 NOTE — Assessment & Plan Note (Signed)
Recent tubal ligation. Doing well.

## 2011-01-18 NOTE — Assessment & Plan Note (Signed)
Severely uncontrolled hypertension for long time. Blood pressure 205/102. She said she was stressed out while coming in due to some parking issues. Although her blood pressure stays really high usually. I would make appropriate changes during the next visit and for now would just make insulin changes.

## 2011-01-18 NOTE — Assessment & Plan Note (Signed)
Lab Results  Component Value Date   HGBA1C 9.5 01/18/2011   CREATININE 0.74 01/02/2011   CREATININE 0.88 10/19/2010   CHOL 182 07/26/2010   HDL 45 07/26/2010   TRIG 112 07/26/2010    Last eye exam and foot exam: No results found for this basename: HMDIABEYEEXA, HMDIABFOOTEX    Assessment: Diabetes control: not controlled Progress toward goals: improved Barriers to meeting goals: Severely uncontrolled type I DM for long time  Plan: Diabetes treatment: Change insulin pump to subcutaneous insulin injections.  Lantus 100 units at bedtime and Humalog/NovoLog 30 units before each meals with adjustment according to moderate sliding scale. She has Humalog 3 bottles now which she will use and then will start using NovoLog which I prescribed today. Refer to: Ophthalmologist Instruction/counseling given: reminded to bring blood glucose meter & log to each visit and discussed foot care

## 2011-01-18 NOTE — Patient Instructions (Signed)
Please make a followup appointment in 2-3 months or earlier if felt needed. Please start taking Lantus 100 units at night-the same time every night. Also start using Humalog 30 units before meals-3 times a day. If your blood sugars are running high- you can use the sliding scale adjustment which I gave to you. If your blood sugars are running high or low after that please call the clinic and make an appointment. Ophthalmologist office will give a call with appointment day and time for eye exam.

## 2011-01-18 NOTE — Progress Notes (Signed)
  Subjective:    Patient ID: Debra [name], female    DOB: Oct 14, 1965, 45 y.o.   MRN: [id number]  HPI Debra [name] is a pleasant 45 year woman with past with history of severe complicated DM 1, uncontrolled HTN, DUB with recent tubal ligation who comes to the clinic for insulin regimen management and regular followup. She is out of her insulin pump since this morning which she was using for long time now. She comes today for changing her regimen from comes to injection insulin. Also she complains of bilateral blurriness in vision since last 2 weeks. She did have a right subconjunctival hemorrhage around the same time-  which she was evaluated in urgent care and by her ophthalmologist in office- which is almost resolved today. She denies any fever, chills, nausea vomiting, abdominal pain, chest pain, short of breath.     Review of Systems    as per history of present illness, all other systems reviewed and negative. Objective:   Physical Exam Vitals: Reviewed. General: NAD HEENT: Bilateral blurry vision, PERRL, EOMI, minimal subconjunctival blood near right lateral corneal border . Cardiac: S1, S2, RRR, no rubs, murmurs or gallops Pulm: clear to auscultation bilaterally, moving normal volumes of air Abd: soft, nontender, nondistended, BS present Ext: warm and well perfused, no pedal edema Neuro: alert and oriented X3, cranial nerves II-XII grossly intact, strength equal in bilateral upper and lower extremities        Assessment & Plan:

## 2011-01-23 ENCOUNTER — Ambulatory Visit (INDEPENDENT_AMBULATORY_CARE_PROVIDER_SITE_OTHER): Payer: Self-pay | Admitting: *Deleted

## 2011-01-23 DIAGNOSIS — A184 Tuberculosis of skin and subcutaneous tissue: Secondary | ICD-10-CM

## 2011-01-23 DIAGNOSIS — Z111 Encounter for screening for respiratory tuberculosis: Secondary | ICD-10-CM

## 2011-02-02 ENCOUNTER — Ambulatory Visit (INDEPENDENT_AMBULATORY_CARE_PROVIDER_SITE_OTHER): Payer: Self-pay | Admitting: Obstetrics & Gynecology

## 2011-02-02 ENCOUNTER — Encounter: Payer: Self-pay | Admitting: Obstetrics & Gynecology

## 2011-02-02 VITALS — BP 209/105 | HR 81 | Temp 98.0°F | Ht 65.0 in | Wt 189.8 lb

## 2011-02-02 DIAGNOSIS — N949 Unspecified condition associated with female genital organs and menstrual cycle: Secondary | ICD-10-CM

## 2011-02-02 DIAGNOSIS — N760 Acute vaginitis: Secondary | ICD-10-CM

## 2011-02-02 DIAGNOSIS — N938 Other specified abnormal uterine and vaginal bleeding: Secondary | ICD-10-CM

## 2011-02-02 DIAGNOSIS — A499 Bacterial infection, unspecified: Secondary | ICD-10-CM

## 2011-02-02 DIAGNOSIS — Z09 Encounter for follow-up examination after completed treatment for conditions other than malignant neoplasm: Secondary | ICD-10-CM

## 2011-02-02 NOTE — Patient Instructions (Signed)
  Return to clinic for any gynecologic concerns or for annual exam.

## 2011-02-02 NOTE — Progress Notes (Signed)
  Subjective:     Debra [name] is a 45 y.o. female who presents to the clinic 4 weeks status post laparoscopic BTS and HTA for abnormal uterine bleeding. Eating a regular diet with difficulty. Bowel movements are normal. The patient is not having any pain.  The following portions of the patient's history were reviewed and updated as appropriate: allergies, current medications, past family history, past medical history, past social history, past surgical history and problem list.  Review of Systems A comprehensive review of systems was negative.    Objective:    BP 209/105  Pulse 81  Temp(Src) 98 F (36.7 C) (Oral)  Ht 5\' 5"  (1.651 m)  Wt 189 lb 12.8 oz (86.093 kg)  BMI 31.58 kg/m2  LMP 12/03/2010 General:  alert and no distress  Abdomen: soft, bowel sounds active, non-tender.  Umbilical incision is healing well, has a small scab.  No erythema, drainage or induration  Pelvic  Normal vulva and vagina. Normal cervix.  Thin, brown discharge seen in vagina, wet prep sample obtained     Pathology 12/29/10 Endometrium, curettage - BENIGN ENDOMETRIAL FRAGMENTS WITH PROGESTATIONAL EFFECT. - CERVICAL AND ENDOCERVICAL FRAGMENTS WITH INFLAMMATION. - SCANT BENIGN MYOMETRIUM. - NO HYPERPLASIA OR CARCINOMA.  Assessment:    Doing well postoperatively. Operative findings again reviewed. Pathology report discussed.  Reassured that increased discharge was normal after ablation.   Plan:    1. Continue any current medications. 2. Wound care discussed, will also follow up wet prep and manage accordingly. 3. Activity restrictions: none 4. Anticipated return to work: now. 5. Follow up: 7 months for annual exam or earlier if needed.

## 2011-02-03 LAB — WET PREP, GENITAL: Trich, Wet Prep: NONE SEEN

## 2011-02-03 MED ORDER — METRONIDAZOLE 500 MG PO TABS: 500.0000 mg | ORAL_TABLET | Freq: Two times a day (BID) | ORAL | Status: AC

## 2011-02-03 NOTE — Progress Notes (Addendum)
Wet prep showed BV.  Flagyl Rx faxed to [facility] pharmacy on file through [facility].  Patient called and informed of diagnosis and treatment; she will go to pick up the prescription.    Jaynie Collins, M.D. 02/03/2011 9:50 AM

## 2011-02-03 NOTE — Progress Notes (Signed)
Addended by: Jaynie Collins A on: 02/03/2011 09:55 AM   Modules accepted: Orders

## 2011-02-09 NOTE — ED Provider Notes (Signed)
I saw and evaluated the patient, reviewed the resident's note and I agree with the findings and plan.  Ethelda Chick, MD 02/09/11 [phone]

## 2011-02-16 ENCOUNTER — Other Ambulatory Visit: Payer: Self-pay | Admitting: *Deleted

## 2011-02-16 DIAGNOSIS — E109 Type 1 diabetes mellitus without complications: Secondary | ICD-10-CM

## 2011-02-16 MED ORDER — INSULIN ASPART 100 UNIT/ML ~~LOC~~ SOLN: 30.0000 [IU] | Freq: Three times a day (TID) | SUBCUTANEOUS | Status: DC

## 2011-02-16 NOTE — Telephone Encounter (Signed)
Pt request PENS not vials.

## 2011-03-22 ENCOUNTER — Ambulatory Visit (INDEPENDENT_AMBULATORY_CARE_PROVIDER_SITE_OTHER): Payer: Self-pay | Admitting: Internal Medicine

## 2011-03-22 ENCOUNTER — Encounter: Payer: Self-pay | Admitting: Internal Medicine

## 2011-03-22 VITALS — BP 199/105 | HR 89 | Temp 97.4°F | Ht 64.5 in | Wt 192.0 lb

## 2011-03-22 DIAGNOSIS — R609 Edema, unspecified: Secondary | ICD-10-CM

## 2011-03-22 DIAGNOSIS — H538 Other visual disturbances: Secondary | ICD-10-CM

## 2011-03-22 DIAGNOSIS — R600 Localized edema: Secondary | ICD-10-CM | POA: Insufficient documentation

## 2011-03-22 DIAGNOSIS — E109 Type 1 diabetes mellitus without complications: Secondary | ICD-10-CM

## 2011-03-22 DIAGNOSIS — I10 Essential (primary) hypertension: Secondary | ICD-10-CM

## 2011-03-22 LAB — GLUCOSE, CAPILLARY: Glucose-Capillary: 238 mg/dL — ABNORMAL HIGH (ref 70–99)

## 2011-03-22 MED ORDER — FUROSEMIDE 20 MG PO TABS: ORAL_TABLET | ORAL | Status: DC

## 2011-03-22 NOTE — Patient Instructions (Addendum)
Please make followup appointment in 4-6 weeks. Increase the dose of Lasix to 40 mg in morning and 20 mg in evening. Keep on taking all the medications regularly.  We will give you a call with appointment with optometrist for your eye check.

## 2011-03-22 NOTE — Assessment & Plan Note (Signed)
Recent diabetic eye exam on 03/08/2011- stable proliferative diabetic retinopathy. Current symptoms possibly from change in prescription glasses. She talked with ophthalmologist about this during the last visit but he recommended getting a separate appointment for this issue of prescription. I will make a referral again.

## 2011-03-22 NOTE — Assessment & Plan Note (Signed)
Lab Results  Component Value Date   NA 141 01/02/2011   K 3.4* 01/02/2011   CL 101 01/02/2011   CO2 27 01/02/2011   BUN 12 01/02/2011   CREATININE 0.74 01/02/2011   CREATININE 0.88 10/19/2010    BP Readings from Last 3 Encounters:  03/22/11 199/105  02/02/11 209/105  01/18/11 205/101    Assessment: Hypertension control:  severely elevated  Progress toward goals:  unchanged Barriers to meeting goals:  Unknown factors  Plan: Hypertension treatment:  Continue Coreg, Norvasc, Lasix, Aldactone, clonidine.

## 2011-03-22 NOTE — Assessment & Plan Note (Signed)
Her recent creatinine was within normal limits. I will increase the dose of Lasix to 40 mg in morning and 20 mg evening.  If she doesn't get better, I will check CMP during next visit.

## 2011-03-22 NOTE — Assessment & Plan Note (Signed)
Lab Results  Component Value Date   HGBA1C 9.5 01/18/2011   CREATININE 0.74 01/02/2011   CREATININE 0.88 10/19/2010   CHOL 182 07/26/2010   HDL 45 07/26/2010   TRIG 112 07/26/2010    Last eye exam and foot exam: No results found for this basename: HMDIABEYEEXA, HMDIABFOOTEX    Assessment: Diabetes control: not controlled due to intercurrent illness Progress toward goals: unchanged Barriers to meeting goals: Unknown reasons  Plan: Diabetes treatment: continue current medications Refer to: none Instruction/counseling given: reminded to bring blood glucose meter & log to each visit and reminded to bring medications to each visit

## 2011-03-22 NOTE — Progress Notes (Signed)
  Subjective:    Patient ID: Debra [name], female    DOB: Dec 11, 1965, 46 y.o.   MRN: [id number]  HPI Debra [name] is a pleasant 46 year woman with past history of uncontrolled DM 1, uncontrolled HTN, DUB who comes to the clinic for regular followup visit.  She has had uterine ablation about 4 weeks before and is recuperating well. She also had admission for chest pain at [facility] in [place] recently- when she was started on Coreg, Norvasc and labetalol was DC'd. Also she had endocrinology followup who helped adjusting her regimen for diabetes.  She saw ophthalmologist recently and was found to have stable proliferative diabetic retinopathy.  She still complains of blurry vision bilaterally and thinks that she needs prescription check.  She denies any nausea, vomiting, fever, chills, chest pain, short of breath, abdominal pain, diarrhea.  She does complain of bilateral leg swelling for past few weeks. Elevation of legs above heart level does not help. No pain or tightness in legs.    Review of Systems    as per history of present illness, all other systems reviewed and negative. Objective:   Physical Exam  General: NAD HEENT: PERRL, EOMI, no scleral icterus Cardiac: S1, S2, RRR, no rubs, murmurs or gallops Pulm: clear to auscultation bilaterally, moving normal volumes of air Abd: soft, nontender, nondistended, BS present Ext: warm and well perfused, no pedal edema Neuro: alert and oriented X3, cranial nerves II-XII grossly intact       Assessment & Plan:

## 2011-03-29 ENCOUNTER — Telehealth: Payer: Self-pay | Admitting: *Deleted

## 2011-03-29 NOTE — Telephone Encounter (Signed)
Received a call from [facility] for clarification of amount of Lantus pt should be taking.   Clinic had written for Lantus 100 units at HS and novolog 30 units before each meal.  Pt had also been hospitalized at [facility] and was put on Lantus 60 units at HS and  Humalog 20 units tid and humalog 0 - 15 units tid    Which do you want and is the 0 - 15 units of Humalog a sliding scale? It was not written as SS on the Rx.

## 2011-03-29 NOTE — Telephone Encounter (Signed)
Humalog 20 units 3 times a day- and add Humalog 0-15 units per sliding scale with every meal on top of 20 units.

## 2011-03-30 NOTE — Telephone Encounter (Signed)
Called to [facility] 

## 2011-03-31 ENCOUNTER — Other Ambulatory Visit: Payer: Self-pay | Admitting: *Deleted

## 2011-03-31 ENCOUNTER — Other Ambulatory Visit: Payer: Self-pay | Admitting: Internal Medicine

## 2011-03-31 DIAGNOSIS — E109 Type 1 diabetes mellitus without complications: Secondary | ICD-10-CM

## 2011-03-31 DIAGNOSIS — I10 Essential (primary) hypertension: Secondary | ICD-10-CM

## 2011-03-31 MED ORDER — CARVEDILOL 25 MG PO TABS: 25.0000 mg | ORAL_TABLET | Freq: Two times a day (BID) | ORAL | Status: DC

## 2011-03-31 MED ORDER — ATORVASTATIN CALCIUM 10 MG PO TABS: 10.0000 mg | ORAL_TABLET | Freq: Every day | ORAL | Status: DC

## 2011-03-31 NOTE — Telephone Encounter (Signed)
[facility] can only get lipitor, will you change the pravastatin 40mg   to lipitor 10mg ? Please add to med list and i will call the pharm

## 2011-05-22 ENCOUNTER — Other Ambulatory Visit: Payer: Self-pay | Admitting: *Deleted

## 2011-05-22 MED ORDER — INSULIN GLARGINE 100 UNIT/ML ~~LOC~~ SOLN: 100.0000 [IU] | Freq: Every day | SUBCUTANEOUS | Status: DC

## 2011-05-22 NOTE — Telephone Encounter (Addendum)
Call from pt - Requesting refill on Lantus but she wants the pen not the vials. Also states she is taking 100 units at bedtime. Requesting qty to last 30 days (30ml not 15ml).  Thanks  Lantus Solostar pen.

## 2011-05-25 ENCOUNTER — Ambulatory Visit (INDEPENDENT_AMBULATORY_CARE_PROVIDER_SITE_OTHER): Payer: Self-pay | Admitting: Internal Medicine

## 2011-05-25 ENCOUNTER — Encounter: Payer: Self-pay | Admitting: Internal Medicine

## 2011-05-25 VITALS — BP 179/90 | HR 80 | Temp 97.0°F | Ht 64.5 in | Wt 187.1 lb

## 2011-05-25 DIAGNOSIS — I10 Essential (primary) hypertension: Secondary | ICD-10-CM

## 2011-05-25 DIAGNOSIS — L989 Disorder of the skin and subcutaneous tissue, unspecified: Secondary | ICD-10-CM

## 2011-05-25 DIAGNOSIS — E109 Type 1 diabetes mellitus without complications: Secondary | ICD-10-CM

## 2011-05-25 LAB — POCT GLYCOSYLATED HEMOGLOBIN (HGB A1C): Hemoglobin A1C: 10.3

## 2011-05-25 MED ORDER — HYDROCHLOROTHIAZIDE 25 MG PO TABS: 25.0000 mg | ORAL_TABLET | Freq: Every day | ORAL | Status: DC

## 2011-05-25 MED ORDER — AMLODIPINE BESYLATE 10 MG PO TABS: 10.0000 mg | ORAL_TABLET | Freq: Every day | ORAL | Status: DC

## 2011-05-25 MED ORDER — METFORMIN HCL 500 MG PO TABS: 500.0000 mg | ORAL_TABLET | Freq: Every day | ORAL | Status: DC

## 2011-05-25 MED ORDER — QUINAPRIL HCL 20 MG PO TABS: 20.0000 mg | ORAL_TABLET | Freq: Two times a day (BID) | ORAL | Status: DC

## 2011-05-25 MED ORDER — SPIRONOLACTONE 100 MG PO TABS: 100.0000 mg | ORAL_TABLET | Freq: Every day | ORAL | Status: DC

## 2011-05-25 NOTE — Progress Notes (Signed)
Patient ID: Debra [name], female   DOB: 09/01/1965, 46 y.o.   MRN: [id number]  HPI:   Patient is a pleasant 46 year old female with past medical history listed below, presents to the outpatient clinic for routine diabetic followup, patient has no specific complaints today. Reports compliance with her medications  Review of Systems: Negative except per history of present illness  Physical Exam:  Nursing notes and vitals reviewed General:  alert, well-developed, and cooperative to examination.   Lungs:  normal respiratory effort, no accessory muscle use, normal breath sounds, no crackles, and no wheezes. Heart:  normal rate, regular rhythm, no murmurs, no gallop, and no rub.   Abdomen:  soft, non-tender, normal bowel sounds, no distention, no guarding, no rebound tenderness, no hepatomegaly, and no splenomegaly.   Extremities:  No cyanosis, clubbing, edema Neurologic:  alert & oriented X3, nonfocal exam  Meds: Medications Prior to Admission  Medication Sig Dispense Refill  . albuterol (PROVENTIL HFA;VENTOLIN HFA) 108 (90 BASE) MCG/ACT inhaler Inhale 2 puffs into the lungs every 6 (six) hours as needed. For asthma      . aspirin 81 MG tablet Take 160 mg by mouth daily.      Marland Kitchen atorvastatin (LIPITOR) 10 MG tablet Take 1 tablet (10 mg total) by mouth daily.  30 tablet  11  . Blood Glucose Monitoring Suppl (WAVESENSE PRESTO) W/DEVICE KIT Check CBG 3 times daily.  1 each  0  . cloNIDine (CATAPRES) 0.3 MG tablet Take 0.1 mg by mouth 2 (two) times daily.       Marland Kitchen dicyclomine (BENTYL) 20 MG tablet 1 tablet every 6 hours as needed for cramping abdominal pain.  120 tablet  5  . diphenoxylate-atropine (LOMOTIL) 2.5-0.025 MG per tablet Take 1 tablet by mouth 4 (four) times daily as needed for diarrhea/loose stools.  100 tablet  1  . gabapentin (NEURONTIN) 400 MG capsule Take 1 capsule (400 mg total) by mouth 3 (three) times daily.  90 capsule  12  . glucose blood test strip Use as instructed  100 each  12    . hydrochlorothiazide (HYDRODIURIL) 25 MG tablet Take 1 tablet (25 mg total) by mouth daily.  30 tablet  3  . ibuprofen (ADVIL) 200 MG tablet Take 3 tablets (600 mg total) by mouth every 6 (six) hours as needed for pain.  30 tablet  2  . insulin glargine (LANTUS SOLOSTAR) 100 UNIT/ML injection Inject 100 Units into the skin at bedtime.  30 mL  12  . insulin lispro (HUMALOG) 100 UNIT/ML injection Inject 20 Units into the skin 3 (three) times daily before meals.      . insulin lispro (HUMALOG) 100 UNIT/ML injection Inject 0-15 Units into the skin 3 (three) times daily before meals. Per sliding scale along with 20 units.      . isosorbide mononitrate (IMDUR) 30 MG 24 hr tablet Take 30 mg by mouth daily.      Marland Kitchen labetalol (NORMODYNE) 200 MG tablet Take 200 mg by mouth 2 (two) times daily.        . metFORMIN (GLUCOPHAGE) 500 MG tablet Take 1 tablet (500 mg total) by mouth daily with breakfast.  90 tablet  3  . nitroGLYCERIN (NITROSTAT) 0.4 MG SL tablet Place 0.4 mg under the tongue every 5 (five) minutes as needed.      Marland Kitchen oxymetazoline (AFRIN) 0.05 % nasal spray Place 3 sprays into the nose 2 (two) times daily.        . quinapril (ACCUPRIL)  20 MG tablet Take 1 tablet (20 mg total) by mouth 2 (two) times daily.  60 tablet  11  . DISCONTD: cloNIDine (CATAPRES) 0.3 MG tablet Take 1 tablet (0.3 mg total) by mouth 2 (two) times daily.  60 tablet  11  . DISCONTD: spironolactone (ALDACTONE) 50 MG tablet Take 1 tablet (50 mg total) by mouth daily.  30 tablet  11   No current facility-administered medications on file as of 05/25/2011.    Allergies: Penicillins Past Medical History  Diagnosis Date  . Allergy     seasonal  . Heart murmur     Birth  . Hyperlipidemia     2005  . Stroke     in 1990  . Neuromuscular disorder     Neuropathy - hands/feet  . Anxiety     1990  . Anemia     2005  . Asthma     2000  . Diabetes mellitus     type i juvenille; 1988  . Hypertension     1998  . Mental  disorder     Depression 1990  . Anal fissure   . Hx of adenomatous colonic polyps   . Internal hemorrhoids 04/24/06    on colonoscopy  . Migraine   . Cataract   . GERD (gastroesophageal reflux disease)   . Staphylococcus aureus infection     toe  . Family history of malignant neoplasm of gastrointestinal tract    Past Surgical History  Procedure Date  . Cholecystectomy   . Trigger finger release     x 3  . Eye surgery   . Cesarean section   . Bilateral foot surgery   . Laparoscopic tubal ligation 12/29/2010    Procedure: LAPAROSCOPIC TUBAL LIGATION;  Surgeon: Tereso Newcomer, MD;  Location: [facility];  Service: Gynecology;  Laterality: Bilateral;  . Iud removal 12/29/2010    Procedure: INTRAUTERINE DEVICE (IUD) REMOVAL;  Surgeon: Tereso Newcomer, MD;  Location: [facility];  Service: Gynecology;  Laterality: N/A;  . Hysteroscopy 12/29/2010    Procedure: HYSTEROSCOPY WITH HYDROTHERMAL ABLATION;  Surgeon: Tereso Newcomer, MD;  Location: [facility];  Service: Gynecology;;  . Dilation and curettage of uterus 12/29/2010    Procedure: DILATATION AND CURETTAGE (D&C);  Surgeon: Tereso Newcomer, MD;  Location: [facility];  Service: Gynecology;;   Family History  Problem Relation Age of Onset  . Hypertension Mother   . Kidney disease Mother   . Hypertension Father   . Breast cancer Maternal Grandmother   . Prostate cancer Maternal Grandfather   . Ovarian cancer Paternal Grandmother   . Prostate cancer Paternal Grandfather   . Colon cancer Maternal Uncle    History   Social History  . Marital Status: Divorced    Spouse Name: N/A    Number of Children: 2  . Years of Education: N/A   Occupational History  . Unemployed    Social History Main Topics  . Smoking status: Never Smoker   . Smokeless tobacco: Never Used  . Alcohol Use: Not on file  . Drug Use: Yes     drug addict  . Sexually Active: Yes    Birth Control/ Protection: IUD   Other Topics Concern  . Not on file   Social History  Narrative   Lost medicaid about 2009 ish when her youngest child turned 30. Has adult children. Lives with boyfriend who financially supports her. Has attempted to get disability but has been turned down.2-3 caffeine drinks daily

## 2011-05-25 NOTE — Assessment & Plan Note (Addendum)
On several medications including clonidine, Lasix, labetalol, Coreg spironolactone, Accupril. Amlodipine. Despite complaints patient's blood pressures severely elevated at 170/90 today.  Plan to discontinue Lasix, and coreg, start patient on hydrochlorothiazide 25mg , and increase spironolactone to 100 mg daily. Next visit if blood pressure still not controlled may increase labetalol to 400 mg twice daily, as a last resort may consider adding hydralazine. Check basic metabolic panel today along with aldosterone and renin activity. Consider checking a renal duplex ultrasound to rule out renal artery stenosis at next visit

## 2011-05-25 NOTE — Assessment & Plan Note (Signed)
Patient's A1c today is 10.3, slightly worse than last time, she is compliant with her Lantus 100 units and Humalog sliding scale. Her CBGs today are mostly high but she has had several episodes of hypoglycemia with sugars in the 60s. Therefore we'll not increase her basal insulin for sliding scale at this point. Note the patient's diabetes is very difficult to control. We'll add metformin to increase  cell sensitivity, and hopefully lower her A1c , starting metformin 500 mg daily to avoid GI side effects, titrate up at next visit .

## 2011-05-25 NOTE — Patient Instructions (Signed)
--   Please take your medications as prescribed.

## 2011-05-25 NOTE — Assessment & Plan Note (Signed)
Appears to have circular black keratotic lesion at the back of her leg, appears to be benign, lesion is nonpainful. Will refer to dermatology for further input

## 2011-05-26 LAB — COMPREHENSIVE METABOLIC PANEL
AST: 20 U/L (ref 0–37)
Albumin: 4.2 g/dL (ref 3.5–5.2)
Alkaline Phosphatase: 105 U/L (ref 39–117)
Potassium: 4.6 mEq/L (ref 3.5–5.3)
Sodium: 136 mEq/L (ref 135–145)
Total Protein: 6.9 g/dL (ref 6.0–8.3)

## 2011-05-26 LAB — MICROALBUMIN / CREATININE URINE RATIO: Microalb Creat Ratio: 68.9 mg/g — ABNORMAL HIGH (ref 0.0–30.0)

## 2011-05-30 LAB — ALDOSTERONE + RENIN ACTIVITY W/ RATIO
ALDO / PRA Ratio: 6.5 Ratio (ref 0.9–28.9)
Aldosterone: 5 ng/dL
PRA LC/MS/MS: 0.77 ng/mL/h (ref 0.25–5.82)

## 2011-06-14 ENCOUNTER — Telehealth: Payer: Self-pay | Admitting: *Deleted

## 2011-06-14 NOTE — Telephone Encounter (Signed)
Pt called stating she was seen in clinic 3/28 and had labs done. She would like the results of her labs.  Pt is c/o diarrhea today, she has gone about 30 times.  She is taking imodium ( 2 bottles and 4 tabs )and lomotil ( 6 tabs )  with no relief. She is having abd.cramps.  She is drinking fluid okay.  Has not eaten today. Hx diarrhea, pain is more intense than she usually has.

## 2011-06-14 NOTE — Telephone Encounter (Signed)
I talked with Dr Aundria Rud and he advised ED if abd cramps continue and diarrhea does not let up. If she feels better she will call in AM for appointment. Pt advise to watch for signs of dehydration.

## 2011-07-14 ENCOUNTER — Emergency Department (HOSPITAL_COMMUNITY): Payer: Self-pay

## 2011-07-14 ENCOUNTER — Encounter (HOSPITAL_COMMUNITY): Payer: Self-pay | Admitting: Cardiology

## 2011-07-14 ENCOUNTER — Emergency Department (INDEPENDENT_AMBULATORY_CARE_PROVIDER_SITE_OTHER)
Admission: EM | Admit: 2011-07-14 | Discharge: 2011-07-14 | Disposition: A | Discharge status: Nursing Home (Includes ICF) | Payer: Self-pay | Source: Home / Self Care | Attending: Emergency Medicine | Admitting: Emergency Medicine

## 2011-07-14 ENCOUNTER — Encounter (HOSPITAL_COMMUNITY): Payer: Self-pay

## 2011-07-14 ENCOUNTER — Emergency Department (HOSPITAL_COMMUNITY)
Admission: EM | Admit: 2011-07-14 | Discharge: 2011-07-14 | Disposition: A | Discharge status: Home Health | Payer: Self-pay | Attending: Emergency Medicine | Admitting: Emergency Medicine

## 2011-07-14 DIAGNOSIS — R0602 Shortness of breath: Secondary | ICD-10-CM | POA: Insufficient documentation

## 2011-07-14 DIAGNOSIS — J45901 Unspecified asthma with (acute) exacerbation: Secondary | ICD-10-CM | POA: Insufficient documentation

## 2011-07-14 DIAGNOSIS — R197 Diarrhea, unspecified: Secondary | ICD-10-CM | POA: Insufficient documentation

## 2011-07-14 DIAGNOSIS — R5381 Other malaise: Secondary | ICD-10-CM | POA: Insufficient documentation

## 2011-07-14 LAB — DIFFERENTIAL
Basophils Relative: 1 % (ref 0–1)
Eosinophils Absolute: 0 10*3/uL (ref 0.0–0.7)
Monocytes Absolute: 0.1 10*3/uL (ref 0.1–1.0)
Monocytes Relative: 2 % — ABNORMAL LOW (ref 3–12)
Neutrophils Relative %: 90 % — ABNORMAL HIGH (ref 43–77)

## 2011-07-14 LAB — BASIC METABOLIC PANEL
BUN: 11 mg/dL (ref 6–23)
Creatinine, Ser: 0.57 mg/dL (ref 0.50–1.10)
GFR calc Af Amer: >90 mL/min (ref >90)
GFR calc non Af Amer: >90 mL/min (ref >90)

## 2011-07-14 LAB — CBC
HCT: 41.9 % (ref 36.0–46.0)
Hemoglobin: 13.7 g/dL (ref 12.0–15.0)
MCH: 28.5 pg (ref 26.0–34.0)
MCHC: 32.7 g/dL (ref 30.0–36.0)

## 2011-07-14 MED ORDER — PREDNISONE 20 MG PO TABS
ORAL_TABLET | ORAL | Status: AC
  Filled 2011-07-14: qty 3

## 2011-07-14 MED ORDER — ALBUTEROL SULFATE (5 MG/ML) 0.5% IN NEBU
5.0000 mg | INHALATION_SOLUTION | Freq: Once | RESPIRATORY_TRACT | Status: AC
  Administered 2011-07-14: 5 mg via RESPIRATORY_TRACT

## 2011-07-14 MED ORDER — PREDNISONE 20 MG PO TABS
60.0000 mg | ORAL_TABLET | Freq: Once | ORAL | Status: AC
  Administered 2011-07-14: 60 mg via ORAL

## 2011-07-14 MED ORDER — ALBUTEROL SULFATE HFA 108 (90 BASE) MCG/ACT IN AERS
2.0000 | INHALATION_SPRAY | RESPIRATORY_TRACT | Status: DC | PRN
  Administered 2011-07-14: 2 via RESPIRATORY_TRACT
  Filled 2011-07-14: qty 6.7

## 2011-07-14 MED ORDER — IPRATROPIUM BROMIDE 0.02 % IN SOLN
0.5000 mg | Freq: Once | RESPIRATORY_TRACT | Status: AC
  Administered 2011-07-14: 0.5 mg via RESPIRATORY_TRACT

## 2011-07-14 MED ORDER — ALBUTEROL SULFATE (5 MG/ML) 0.5% IN NEBU
INHALATION_SOLUTION | RESPIRATORY_TRACT | Status: AC
  Filled 2011-07-14: qty 0.5

## 2011-07-14 MED ORDER — PREDNISONE 10 MG PO TABS: 10.0000 mg | ORAL_TABLET | Freq: Every day | ORAL | Status: DC

## 2011-07-14 MED ORDER — ALBUTEROL SULFATE HFA 108 (90 BASE) MCG/ACT IN AERS: 1.0000 | INHALATION_SPRAY | Freq: Four times a day (QID) | RESPIRATORY_TRACT | Status: DC | PRN

## 2011-07-14 MED ORDER — IOHEXOL 350 MG/ML SOLN
100.0000 mL | Freq: Once | INTRAVENOUS | Status: AC | PRN
  Administered 2011-07-14: 100 mL via INTRAVENOUS

## 2011-07-14 NOTE — ED Provider Notes (Signed)
History     CSN: [id number]  Arrival date & time 07/14/11  8295   First MD Initiated Contact with Patient 07/14/11 1008      Chief Complaint  Patient presents with  . Asthma    (Consider location/radiation/quality/duration/timing/severity/associated sxs/prior treatment) HPI Comments: Pt with h/o asthma reports coughing, wheezing, SOB, chest tightness starting last night. Achy CP after coughing. Reports posttussive emesis starting this morning Using rescue inhaler multiple times w/o relief. States that she is waking up at night coughing.  No other chest pain, nausea, vomiting, fevers. No URI. No orthopnea, lower extremity swelling, abdominal pain. admitted to hospital for asthma, last admission 5 or 6 years ago. She was intubated on admission.  No recent steriod use. States this feels identical to previous asthma attacks, and states that this was triggered by change in weather/pollen exposure.   ROS as noted in HPI. All other ROS negative.   Patient is a 46 y.o. female presenting with shortness of breath. The history is provided by the patient and the nursing home. No language interpreter was used.  Shortness of Breath  The current episode started yesterday. The onset was sudden. The problem has been gradually worsening. The symptoms are relieved by nothing. The symptoms are aggravated by allergens. Associated symptoms include cough, shortness of breath and wheezing. Pertinent negatives include no chest pain, no chest pressure, no orthopnea, no fever, no rhinorrhea and no stridor. She has not inhaled smoke recently. She has had prior hospitalizations. She has had prior ICU admissions. She has had prior intubations (5-6 years ago per pt). There were no sick contacts.    Past Medical History  Diagnosis Date  . Allergy     seasonal  . Heart murmur     Birth  . Hyperlipidemia     2005  . Stroke     in 1990  . Neuromuscular disorder     Neuropathy - hands/feet  . Anxiety     1990  .  Anemia     2005  . Asthma     2000  . Diabetes mellitus     type i juvenille; 1988  . Hypertension     1998  . Mental disorder     Depression 1990  . Anal fissure   . Hx of adenomatous colonic polyps   . Internal hemorrhoids 04/24/06    on colonoscopy  . Migraine   . Cataract   . GERD (gastroesophageal reflux disease)   . Staphylococcus aureus infection     toe  . Family history of malignant neoplasm of gastrointestinal tract   . Anginal pain     Past Surgical History  Procedure Date  . Cholecystectomy   . Trigger finger release     x 3  . Eye surgery   . Cesarean section   . Bilateral foot surgery   . Laparoscopic tubal ligation 12/29/2010    Procedure: LAPAROSCOPIC TUBAL LIGATION;  Surgeon: Tereso Newcomer, MD;  Location: [facility];  Service: Gynecology;  Laterality: Bilateral;  . Iud removal 12/29/2010    Procedure: INTRAUTERINE DEVICE (IUD) REMOVAL;  Surgeon: Tereso Newcomer, MD;  Location: [facility];  Service: Gynecology;  Laterality: N/A;  . Hysteroscopy 12/29/2010    Procedure: HYSTEROSCOPY WITH HYDROTHERMAL ABLATION;  Surgeon: Tereso Newcomer, MD;  Location: [facility];  Service: Gynecology;;  . Dilation and curettage of uterus 12/29/2010    Procedure: DILATATION AND CURETTAGE (D&C);  Surgeon: Tereso Newcomer, MD;  Location: [facility];  Service:  Gynecology;;    Family History  Problem Relation Age of Onset  . Hypertension Mother   . Kidney disease Mother   . Hypertension Father   . Breast cancer Maternal Grandmother   . Prostate cancer Maternal Grandfather   . Ovarian cancer Paternal Grandmother   . Prostate cancer Paternal Grandfather   . Colon cancer Maternal Uncle     History  Substance Use Topics  . Smoking status: Never Smoker   . Smokeless tobacco: Never Used  . Alcohol Use: No    OB History    Grav Para Term Preterm Abortions TAB SAB Ect Mult Living   2 2 1 1  0 0 0 0 0 2      Review of Systems  Constitutional: Negative for fever.  HENT: Negative  for rhinorrhea.   Respiratory: Positive for cough, shortness of breath and wheezing. Negative for stridor.   Cardiovascular: Negative for chest pain and orthopnea.  Gastrointestinal: Positive for vomiting. Negative for nausea.    Allergies  Penicillins  Home Medications   Current Outpatient Rx  Name Route Sig Dispense Refill  . ALBUTEROL SULFATE HFA 108 (90 BASE) MCG/ACT IN AERS Inhalation Inhale 2 puffs into the lungs every 6 (six) hours as needed. For asthma    . AMLODIPINE BESYLATE 10 MG PO TABS Oral Take 1 tablet (10 mg total) by mouth daily. 30 tablet 11  . ASPIRIN 81 MG PO TABS Oral Take 160 mg by mouth daily.    . ATORVASTATIN CALCIUM 10 MG PO TABS Oral Take 1 tablet (10 mg total) by mouth daily. 30 tablet 11  . CLONIDINE HCL 0.3 MG PO TABS Oral Take 0.1 mg by mouth 2 (two) times daily.     Marland Kitchen DICYCLOMINE HCL 20 MG PO TABS  1 tablet every 6 hours as needed for cramping abdominal pain. 120 tablet 5  . DIPHENOXYLATE-ATROPINE 2.5-0.025 MG PO TABS Oral Take 1 tablet by mouth 4 (four) times daily as needed for diarrhea/loose stools. 100 tablet 1  . GABAPENTIN 400 MG PO CAPS Oral Take 1 capsule (400 mg total) by mouth 3 (three) times daily. 90 capsule 12    Faxed to [facility]--new directions tid--removed taper d ...  . HYDROCHLOROTHIAZIDE 25 MG PO TABS Oral Take 1 tablet (25 mg total) by mouth daily. 30 tablet 3  . INSULIN GLARGINE 100 UNIT/ML [place] SOLN Subcutaneous Inject 100 Units into the skin at bedtime. 30 mL 12  . INSULIN LISPRO (HUMAN) 100 UNIT/ML [place] SOLN Subcutaneous Inject 20 Units into the skin 3 (three) times daily before meals.    . INSULIN LISPRO (HUMAN) 100 UNIT/ML [place] SOLN Subcutaneous Inject 0-15 Units into the skin 3 (three) times daily before meals. Per sliding scale along with 20 units.    . ISOSORBIDE MONONITRATE ER 30 MG PO TB24 Oral Take 30 mg by mouth daily.    Marland Kitchen LABETALOL HCL 200 MG PO TABS Oral Take 200 mg by mouth 2 (two) times daily.      Marland Kitchen NITROGLYCERIN 0.4 MG SL SUBL  Sublingual Place 0.4 mg under the tongue every 5 (five) minutes as needed.    . OLOPATADINE HCL 0.1 % OP SOLN Both Eyes Place 1 drop into both eyes 2 (two) times daily.    Marland Kitchen OXYMETAZOLINE HCL 0.05 % NA SOLN Nasal Place 3 sprays into the nose 2 (two) times daily.      . QUINAPRIL HCL 20 MG PO TABS Oral Take 1 tablet (20 mg total) by mouth 2 (two)  times daily. 60 tablet 11  . SPIRONOLACTONE 100 MG PO TABS Oral Take 1 tablet (100 mg total) by mouth daily. 30 tablet 11  . WAVESENSE PRESTO W/DEVICE KIT  Check CBG 3 times daily. 1 each 0    Please dispense strips for glucose check.  Marland Kitchen GLUCOSE BLOOD VI STRP  Use as instructed 100 each 12    Check blood sugar before every meals. That will be ...  . IBUPROFEN 200 MG PO TABS Oral Take 3 tablets (600 mg total) by mouth every 6 (six) hours as needed for pain. 30 tablet 2  . METFORMIN HCL 500 MG PO TABS Oral Take 1 tablet (500 mg total) by mouth daily with breakfast. 90 tablet 3    BP 184/75  Pulse 83  Temp(Src) 98.4 F (36.9 C) (Oral)  Resp 18  SpO2 99%  Physical Exam  Nursing note and vitals reviewed. Constitutional: She is oriented to person, place, and time. She appears well-developed and well-nourished. She appears distressed.       Anxious, able to speak in short sentences only  HENT:  Head: Normocephalic and atraumatic.  Eyes: Conjunctivae and EOM are normal.  Neck: Normal range of motion.  Cardiovascular: Normal rate, regular rhythm and normal heart sounds.   Pulmonary/Chest: She is in respiratory distress. She has no wheezes. She has no rales. She exhibits no tenderness.       Poor air movement. Prolonged expiratory phase  Abdominal: She exhibits no distension.  Musculoskeletal: Normal range of motion. She exhibits no edema and no tenderness.       calves symmetric  Neurological: She is alert and oriented to person, place, and time.  Skin: Skin is warm and dry.  Psychiatric: She has a normal mood and affect. Her behavior is normal.  Judgment and thought content normal.    ED Course  Procedures (including critical care time)  Labs Reviewed - No data to display No results found.   1. Asthma exacerbation      MDM  Previous records reviewed. Additional PMH obtained.   Calculated peak flow 460s. Patient states that she was "never very good" on her best day, does not remember what her Max peak flow is.   H&P most consistent asthma exacerbation. Do not suspect CHF, pneumonia. Pretreatment peak flow: 170/220/210. DuoNeb, 60 prednisone After treatment #1.200/170/170 Slightly improved air movement, no wheezing patient still dyspneic when speaking. Wants to try another DuoNeb. If she is not significantly improved after this, she will agree to go to the ED.  Reevaluation repeatr peak fow 230. Still no significant improvement. Exam unchanged transferring to ED for asthma exacerbation req further tx.  Luiz Blare, MD 07/14/11 1155

## 2011-07-14 NOTE — ED Notes (Signed)
Pt reports asthma attack last PM at 430pm and 10pm. Woke up this AM at 430 was unable to breath when she sat up. She has been using her albuterol inhaler with no relief of symptoms. Pt reports chest tightness. Good air movement throughout lung fields pt reports coughing episodes that just won't stop.  Denies fever. Had one episode of vomiting prior to arrival due to coughing. Pt has taken her BP meds this morn and did not see them in her emesis.

## 2011-07-14 NOTE — ED Notes (Signed)
The patient's CBG was 247 at this time.

## 2011-07-14 NOTE — ED Notes (Signed)
Removed oxygen from pt.

## 2011-07-14 NOTE — ED Notes (Signed)
Patient placed in CDU 2, patient in NAD at this time, family at bedside

## 2011-07-14 NOTE — ED Provider Notes (Signed)
Patient from UC and transferred to ED for complaints of SOB. Pt originally seen by Dr, Effie Shy and then placed in my care in the CDU. Patients chest xray, respirations, BP, O2 sats are all normal but she still feels SOB.  Pt d-dimer shows mild elevation. CT angio chest ordered to ro PE.   Pt at this time still says she feels short of breath, feels fatigued when she goes to the bathroom and admitted to just having a episode of diarrhea. Pts VSS and she is in no acute distress will re-evaluate when scan results come back. 5:16 PM  CT angio has come back normal. Pt re-ambulated and oxygen stayed above 98% she is feeling a bit better. Will give albuterol inhaler in ED, as patient says hers has expired, and prescription for prednisone taper and albuterol.  Discussed plan with patient, she is to follow-up with pulmonology.  Pt has been advised of the symptoms that warrant their return to the ED. Patient has voiced understanding and has agreed to follow-up with the PCP or specialist.   Dorthula Matas, PA 07/14/11 [phone]

## 2011-07-14 NOTE — ED Notes (Signed)
The patient is back from CT.

## 2011-07-14 NOTE — ED Notes (Signed)
Peak flow following neb treament #2 170, 230, 220. Pt is giving a good effort states she just can't blow out and this is normal for her.

## 2011-07-14 NOTE — ED Notes (Signed)
Post treatment peak flow 170, 170, 200. Dr Chaney Malling aware.

## 2011-07-14 NOTE — ED Notes (Signed)
Pt states that she has had 4 asthma attacks since 1630 yesterday and that her normal medications are not helping. The most recent asthma attack was accompanied by an episode of vomiting. Pt states that her SOB is worse with exertion and when she lays down.

## 2011-07-14 NOTE — ED Notes (Signed)
Peak flow pre-neb treatment 170, 210, 230

## 2011-07-14 NOTE — ED Notes (Signed)
Pt.walked fine sats stayed at 99%

## 2011-07-14 NOTE — ED Notes (Signed)
Sob since yesterday, sent here from [facility] because no improvement in symptoms after treatments

## 2011-07-14 NOTE — Discharge Instructions (Signed)

## 2011-07-14 NOTE — ED Provider Notes (Deleted)
Medical screening examination/treatment/procedure(s) were conducted as a shared visit with non-physician practitioner(s) and myself.  I personally evaluated the patient during the encounter  Flint Melter, MD 07/14/11 2046

## 2011-08-07 ENCOUNTER — Encounter: Payer: Self-pay | Admitting: Internal Medicine

## 2011-08-07 ENCOUNTER — Ambulatory Visit (INDEPENDENT_AMBULATORY_CARE_PROVIDER_SITE_OTHER): Payer: Self-pay | Admitting: Internal Medicine

## 2011-08-07 VITALS — BP 200/120 | HR 86 | Temp 98.0°F | Ht 64.0 in | Wt 191.0 lb

## 2011-08-07 DIAGNOSIS — R0609 Other forms of dyspnea: Secondary | ICD-10-CM

## 2011-08-07 DIAGNOSIS — J45909 Unspecified asthma, uncomplicated: Secondary | ICD-10-CM

## 2011-08-07 DIAGNOSIS — R0600 Dyspnea, unspecified: Secondary | ICD-10-CM | POA: Insufficient documentation

## 2011-08-07 NOTE — Progress Notes (Signed)
Subjective:    Patient ID: Debra [name], female    DOB: 04-Jul-1965, 46 y.o.   MRN: [id number]  HPI  46 year female. PCP is PATEL,RAVI, MD, MD . Body mass index is 32.79 kg/(m^2).   reports that she has never smoked. She has never used smokeless tobacco. she has chronic systolic murmur nos (no echo on record)   Diagnosed with asthma 1998/1999 by PMD on basis of symptoms and 'breathing test'. Main symptom is  dyspnea. Reports asthma that is getting worse x 6 years but recently 07/14/11 went to ER with 'asthma attack' and therefore referred here  Feels she does not have capacity to blow things like candle. Triggered by pollen, bleach, dust, perfumes. Associated wheezing +. Dyspnea improved by albuterol somewhat.  Also, dyspneic with exertion for 20 feet and relieved by rest. Feels she needs specialist help because imaging and oxygen levels are normal.  She does report associated wheezing, atypical chest pains,  and cough that is hand in hand with dyspnea. There is associated paroxysmal nocturnal dyspnea at least 1-2 times per night at baseline. Denies CAD history.    Asthma risk: 3 er visits in past 1-2 years. Rx with prednisone +   Current medications: only abulterol prn. Due to lack of insurance, since 2009, not taking scheduled ICS or singulair but she is familiar with the drugs.     family hx: nephew with asthma +  LABS  Ct chest 07/14/11   IMPRESSION:  1. No evidence of pulmonary embolism.  2. No acute findings in the thorax to account for the patient's  symptoms.  3. Mild cardiomegaly.  4. Patulous esophagus.  Original Report Authenticated By: Florencia Reasons, M.D.  Walk test 08/07/2011  185 feet x 3 laps: no desaturation.   Past Medical History  Diagnosis Date  . Allergy     seasonal  . Heart murmur     Birth  . Hyperlipidemia     2005  . Stroke     in 1990  . Neuromuscular disorder     Neuropathy - hands/feet  . Anxiety     1990  . Anemia     2005  . Asthma    2000  . Diabetes mellitus     type i juvenille; 1988  . Hypertension     1998  . Mental disorder     Depression 1990  . Anal fissure   . Hx of adenomatous colonic polyps   . Internal hemorrhoids 04/24/06    on colonoscopy  . Migraine   . Cataract   . GERD (gastroesophageal reflux disease)   . Staphylococcus aureus infection     toe  . Family history of malignant neoplasm of gastrointestinal tract   . Anginal pain      Family History  Problem Relation Age of Onset  . Hypertension Mother   . Kidney disease Mother   . Hypertension Father   . Breast cancer Maternal Grandmother   . Prostate cancer Maternal Grandfather   . Ovarian cancer Paternal Grandmother   . Prostate cancer Paternal Grandfather   . Colon cancer Maternal Uncle      History   Social History  . Marital Status: Divorced    Spouse Name: N/A    Number of Children: 2  . Years of Education: N/A   Occupational History  . Unemployed    Social History Main Topics  . Smoking status: Never Smoker   . Smokeless tobacco: Never Used  . Alcohol  Use: No  . Drug Use: No     drug addict  . Sexually Active: Yes    Birth Control/ Protection: IUD   Other Topics Concern  . Not on file   Social History Narrative   Lost medicaid about 2009 ish when her youngest child turned 87. Has adult children. Lives with boyfriend who financially supports her. Has attempted to get disability but has been turned down.2-3 caffeine drinks daily      Allergies  Allergen Reactions  . Penicillins Nausea And Vomiting and Rash     Outpatient Prescriptions Prior to Visit  Medication Sig Dispense Refill  . albuterol (PROVENTIL HFA;VENTOLIN HFA) 108 (90 BASE) MCG/ACT inhaler Inhale 1-2 puffs into the lungs every 6 (six) hours as needed for wheezing.  1 Inhaler  0  . amLODipine (NORVASC) 5 MG tablet Take 5 mg by mouth daily.      Marland Kitchen aspirin EC 81 MG tablet Take 162 mg by mouth daily.      Marland Kitchen atorvastatin (LIPITOR) 10 MG tablet Take 10  mg by mouth daily.      . cloNIDine (CATAPRES) 0.3 MG tablet Take 0.1 mg by mouth 2 (two) times daily.       Marland Kitchen dicyclomine (BENTYL) 20 MG tablet Take 20 mg by mouth every 6 (six) hours.      . diphenoxylate-atropine (LOMOTIL) 2.5-0.025 MG per tablet Take 1 tablet by mouth 4 (four) times daily as needed. For diarrhea.      . gabapentin (NEURONTIN) 400 MG capsule Take 400 mg by mouth 3 (three) times daily.      . hydrochlorothiazide (HYDRODIURIL) 25 MG tablet Take 1 tablet (25 mg total) by mouth daily.  30 tablet  3  . ibuprofen (ADVIL,MOTRIN) 200 MG tablet Take 300 mg by mouth every 6 (six) hours as needed. For pain.      Marland Kitchen insulin glargine (LANTUS SOLOSTAR) 100 UNIT/ML injection Inject 100 Units into the skin at bedtime.  30 mL  12  . insulin lispro (HUMALOG) 100 UNIT/ML injection Inject 0-15 Units into the skin 3 (three) times daily before meals. Per sliding scale along with 20 units.      . isosorbide mononitrate (IMDUR) 30 MG 24 hr tablet Take 30 mg by mouth daily.      . nitroGLYCERIN (NITROSTAT) 0.4 MG SL tablet Place 0.4 mg under the tongue every 5 (five) minutes as needed.      Marland Kitchen olopatadine (PATANOL) 0.1 % ophthalmic solution Place 1 drop into both eyes 2 (two) times daily.      Marland Kitchen oxymetazoline (AFRIN) 0.05 % nasal spray Place 3 sprays into the nose 2 (two) times daily.        . quinapril (ACCUPRIL) 20 MG tablet Take 1 tablet (20 mg total) by mouth 2 (two) times daily.  60 tablet  11  . spironolactone (ALDACTONE) 100 MG tablet Take 1 tablet (100 mg total) by mouth daily.  30 tablet  11  . albuterol (PROVENTIL HFA;VENTOLIN HFA) 108 (90 BASE) MCG/ACT inhaler Inhale 2 puffs into the lungs every 6 (six) hours as needed. For asthma      . labetalol (NORMODYNE) 200 MG tablet Take 200 mg by mouth 2 (two) times daily.        . metFORMIN (GLUCOPHAGE) 500 MG tablet Take 1 tablet (500 mg total) by mouth daily with breakfast.  90 tablet  3  . predniSONE (DELTASONE) 10 MG tablet Take 1 tablet (10 mg  total) by mouth daily.  15 tablet  0      Review of Systems  Constitutional: Negative for fever and unexpected weight change.  HENT: Positive for congestion, sore throat and postnasal drip. Negative for ear pain, nosebleeds, rhinorrhea, sneezing, trouble swallowing, dental problem and sinus pressure.   Eyes: Negative for redness and itching.  Respiratory: Positive for cough, chest tightness, shortness of breath and wheezing.   Cardiovascular: Positive for palpitations and leg swelling.  Gastrointestinal: Positive for nausea and vomiting.  Genitourinary: Negative for dysuria.  Musculoskeletal: Negative for joint swelling.  Skin: Negative for rash.  Neurological: Positive for headaches.  Hematological: Does not bruise/bleed easily.  Psychiatric/Behavioral: Negative for dysphoric mood. The patient is not nervous/anxious.        Objective:   Physical Exam  Vitals reviewed. Constitutional: She is oriented to person, place, and time. She appears well-developed and well-nourished. No distress.  HENT:  Head: Normocephalic and atraumatic.  Right Ear: External ear normal.  Left Ear: External ear normal.  Mouth/Throat: Oropharynx is clear and moist. No oropharyngeal exudate.  Eyes: Conjunctivae and EOM are normal. Pupils are equal, round, and reactive to light. Right eye exhibits no discharge. Left eye exhibits no discharge. No scleral icterus.  Neck: Normal range of motion. Neck supple. No JVD present. No tracheal deviation present. No thyromegaly present.  Cardiovascular: Normal rate, regular rhythm and intact distal pulses.  Exam reveals no gallop and no friction rub.   Murmur heard. Pulmonary/Chest: Effort normal and breath sounds normal. No respiratory distress. She has no wheezes. She has no rales. She exhibits no tenderness.  Abdominal: Soft. Bowel sounds are normal. She exhibits no distension and no mass. There is no tenderness. There is no rebound and no guarding.  Musculoskeletal:  Normal range of motion. She exhibits no edema and no tenderness.  Lymphadenopathy:    She has no cervical adenopathy.  Neurological: She is alert and oriented to person, place, and time. She has normal reflexes. No cranial nerve deficit. She exhibits normal muscle tone. Coordination normal.  Skin: Skin is warm and dry. No rash noted. She is not diaphoretic. No erythema. No pallor.  Psychiatric: She has a normal mood and affect. Her behavior is normal. Judgment and thought content normal.          Assessment & Plan:

## 2011-08-07 NOTE — Patient Instructions (Signed)
Nurse will walk you for oxygen levels now Please have full PFT breathing test I will review results and advise you of next step

## 2011-08-07 NOTE — Assessment & Plan Note (Signed)
Get pft first; depending on result advice either to come in for med mgmt or arrange opd methacholine challenge test

## 2011-08-10 ENCOUNTER — Ambulatory Visit (INDEPENDENT_AMBULATORY_CARE_PROVIDER_SITE_OTHER): Payer: Self-pay | Admitting: Internal Medicine

## 2011-08-10 DIAGNOSIS — J45909 Unspecified asthma, uncomplicated: Secondary | ICD-10-CM

## 2011-08-10 DIAGNOSIS — R0989 Other specified symptoms and signs involving the circulatory and respiratory systems: Secondary | ICD-10-CM

## 2011-08-10 DIAGNOSIS — R0600 Dyspnea, unspecified: Secondary | ICD-10-CM

## 2011-08-10 LAB — PULMONARY FUNCTION TEST

## 2011-08-10 NOTE — Progress Notes (Signed)
PFT done today. 

## 2011-08-14 ENCOUNTER — Telehealth: Payer: Self-pay | Admitting: Internal Medicine

## 2011-08-14 DIAGNOSIS — R0600 Dyspnea, unspecified: Secondary | ICD-10-CM

## 2011-08-14 NOTE — Telephone Encounter (Signed)
pft 08/10/11 not diagnostic of asthma. Please do methacholine challenge test and after that I will rview test and advice next step.   For my review below   PFT 08/10/11 are very c/w Restriction with low dlco byt CT does not show ILD  - fvc 1.9L/55%. fev1 1.46L/55%, No BD response  - TLC 3.4L/68%  - DLCO 53%

## 2011-08-22 NOTE — Telephone Encounter (Signed)
[email or web address] Agrusa, CMA  

## 2011-08-23 NOTE — Telephone Encounter (Signed)
Pt aware of results and order placed. Debra [name], CMA  

## 2011-08-29 NOTE — ED Provider Notes (Signed)
History     CSN: [id number]  Arrival date & time 07/14/11  1229   First MD Initiated Contact with Patient 07/14/11 1340      Chief Complaint  Patient presents with  . Asthma    (Consider location/radiation/quality/duration/timing/severity/associated sxs/prior treatment) HPI Comments: Debra [name] is a 46 y.o. Female who presents with cough, wheezing, and shortness of breath. She's been using her usual medications without relief. She's previously been intubated. She denies other recent illnesses, including nausea, vomiting, fever, or chills.  Patient is a 46 y.o. female presenting with asthma. The history is provided by the patient.  Asthma    Past Medical History  Diagnosis Date  . Allergy     seasonal  . Heart murmur     Birth  . Hyperlipidemia     2005  . Stroke     in 1990  . Neuromuscular disorder     Neuropathy - hands/feet  . Anxiety     1990  . Anemia     2005  . Asthma     2000  . Diabetes mellitus     type i juvenille; 1988  . Hypertension     1998  . Mental disorder     Depression 1990  . Anal fissure   . Hx of adenomatous colonic polyps   . Internal hemorrhoids 04/24/06    on colonoscopy  . Migraine   . Cataract   . GERD (gastroesophageal reflux disease)   . Staphylococcus aureus infection     toe  . Family history of malignant neoplasm of gastrointestinal tract   . Anginal pain     Past Surgical History  Procedure Date  . Cholecystectomy   . Trigger finger release     x 3  . Eye surgery   . Cesarean section   . Bilateral foot surgery   . Laparoscopic tubal ligation 12/29/2010    Procedure: LAPAROSCOPIC TUBAL LIGATION;  Surgeon: Tereso Newcomer, MD;  Location: [facility];  Service: Gynecology;  Laterality: Bilateral;  . Iud removal 12/29/2010    Procedure: INTRAUTERINE DEVICE (IUD) REMOVAL;  Surgeon: Tereso Newcomer, MD;  Location: [facility];  Service: Gynecology;  Laterality: N/A;  . Hysteroscopy 12/29/2010    Procedure: HYSTEROSCOPY WITH  HYDROTHERMAL ABLATION;  Surgeon: Tereso Newcomer, MD;  Location: [facility];  Service: Gynecology;;  . Dilation and curettage of uterus 12/29/2010    Procedure: DILATATION AND CURETTAGE (D&C);  Surgeon: Tereso Newcomer, MD;  Location: [facility];  Service: Gynecology;;    Family History  Problem Relation Age of Onset  . Hypertension Mother   . Kidney disease Mother   . Hypertension Father   . Breast cancer Maternal Grandmother   . Prostate cancer Maternal Grandfather   . Ovarian cancer Paternal Grandmother   . Prostate cancer Paternal Grandfather   . Colon cancer Maternal Uncle     History  Substance Use Topics  . Smoking status: Never Smoker   . Smokeless tobacco: Never Used  . Alcohol Use: No    OB History    Grav Para Term Preterm Abortions TAB SAB Ect Mult Living   2 2 1 1  0 0 0 0 0 2      Review of Systems  All other systems reviewed and are negative.    Allergies  Penicillins  Home Medications   Current Outpatient Rx  Name Route Sig Dispense Refill  . AMLODIPINE BESYLATE 5 MG PO TABS Oral Take 5 mg by mouth  daily.    . ASPIRIN EC 81 MG PO TBEC Oral Take 162 mg by mouth daily.    . ATORVASTATIN CALCIUM 10 MG PO TABS Oral Take 10 mg by mouth daily.    Marland Kitchen CLONIDINE HCL 0.3 MG PO TABS Oral Take 0.1 mg by mouth 2 (two) times daily.     Marland Kitchen DICYCLOMINE HCL 20 MG PO TABS Oral Take 20 mg by mouth every 6 (six) hours.    Marland Kitchen DIPHENOXYLATE-ATROPINE 2.5-0.025 MG PO TABS Oral Take 1 tablet by mouth 4 (four) times daily as needed. For diarrhea.    Marland Kitchen GABAPENTIN 400 MG PO CAPS Oral Take 400 mg by mouth 3 (three) times daily.    Marland Kitchen HYDROCHLOROTHIAZIDE 25 MG PO TABS Oral Take 1 tablet (25 mg total) by mouth daily. 30 tablet 3  . IBUPROFEN 200 MG PO TABS Oral Take 300 mg by mouth every 6 (six) hours as needed. For pain.    . INSULIN GLARGINE 100 UNIT/ML [place] SOLN Subcutaneous Inject 100 Units into the skin at bedtime. 30 mL 12  . INSULIN LISPRO (HUMAN) 100 UNIT/ML [place] SOLN Subcutaneous Inject  0-15 Units into the skin 3 (three) times daily before meals. Per sliding scale along with 20 units.    . ISOSORBIDE MONONITRATE ER 30 MG PO TB24 Oral Take 30 mg by mouth daily.    Marland Kitchen NITROGLYCERIN 0.4 MG SL SUBL Sublingual Place 0.4 mg under the tongue every 5 (five) minutes as needed.    . OLOPATADINE HCL 0.1 % OP SOLN Both Eyes Place 1 drop into both eyes 2 (two) times daily.    Marland Kitchen OXYMETAZOLINE HCL 0.05 % NA SOLN Nasal Place 3 sprays into the nose 2 (two) times daily.      . QUINAPRIL HCL 20 MG PO TABS Oral Take 1 tablet (20 mg total) by mouth 2 (two) times daily. 60 tablet 11  . SPIRONOLACTONE 100 MG PO TABS Oral Take 1 tablet (100 mg total) by mouth daily. 30 tablet 11  . ALBUTEROL SULFATE HFA 108 (90 BASE) MCG/ACT IN AERS Inhalation Inhale 1-2 puffs into the lungs every 6 (six) hours as needed for wheezing. 1 Inhaler 0    BP 178/89  Pulse 88  Temp 97.6 F (36.4 C) (Oral)  Resp 20  SpO2 100%  Physical Exam  Nursing note and vitals reviewed. Constitutional: She is oriented to person, place, and time. She appears well-developed and well-nourished.  HENT:  Head: Normocephalic and atraumatic.  Eyes: Conjunctivae and EOM are normal. Pupils are equal, round, and reactive to light.  Neck: Normal range of motion and phonation normal. Neck supple.  Cardiovascular: Normal rate, regular rhythm and intact distal pulses.   Pulmonary/Chest: Effort normal. She exhibits no tenderness.       Decreased air movement bilaterally.  Abdominal: Soft. She exhibits no distension. There is no tenderness. There is no guarding.  Musculoskeletal: Normal range of motion.  Neurological: She is alert and oriented to person, place, and time. She has normal strength. She exhibits normal muscle tone.  Skin: Skin is warm and dry.  Psychiatric: She has a normal mood and affect. Her behavior is normal. Judgment and thought content normal.    ED Course  Procedures (including critical care time)   Patient was moved  to the clinical decision unit for completion of labs, and imaging studies. Disposition as per PA in the CDU  Labs Reviewed  DIFFERENTIAL - Abnormal; Notable for the following:    Neutrophils Relative 90 (*)  Lymphocytes Relative 7 (*)     Lymphs Abs 0.4 (*)     Monocytes Relative 2 (*)     All other components within normal limits  BASIC METABOLIC PANEL - Abnormal; Notable for the following:    Glucose, Bld 236 (*)     All other components within normal limits  D-DIMER, QUANTITATIVE - Abnormal; Notable for the following:    D-Dimer, Quant 0.52 (*)     All other components within normal limits  GLUCOSE, CAPILLARY - Abnormal; Notable for the following:    Glucose-Capillary 247 (*)     All other components within normal limits  CBC  LAB REPORT - SCANNED   No results found.   1. Asthma exacerbation   2. Shortness of breath       MDM  Nonspecific shortness of breath. Disposition per CDU personnel          Flint Melter, MD 08/29/11 2033

## 2011-08-30 ENCOUNTER — Ambulatory Visit (HOSPITAL_COMMUNITY)
Admission: RE | Admit: 2011-08-30 | Discharge: 2011-08-30 | Disposition: A | Discharge status: Home / Self Care | Payer: Self-pay | Source: Ambulatory Visit | Attending: Internal Medicine | Admitting: Internal Medicine

## 2011-08-30 DIAGNOSIS — R0989 Other specified symptoms and signs involving the circulatory and respiratory systems: Secondary | ICD-10-CM | POA: Insufficient documentation

## 2011-08-30 DIAGNOSIS — R0600 Dyspnea, unspecified: Secondary | ICD-10-CM

## 2011-08-30 DIAGNOSIS — R0609 Other forms of dyspnea: Secondary | ICD-10-CM | POA: Insufficient documentation

## 2011-08-30 LAB — PULMONARY FUNCTION TEST

## 2011-08-30 MED ORDER — METHACHOLINE 0.25 MG/ML NEB SOLN
2.0000 mL | Freq: Once | RESPIRATORY_TRACT | Status: AC
  Administered 2011-08-30: 0.5 mg via RESPIRATORY_TRACT
  Filled 2011-08-30: qty 2

## 2011-08-30 MED ORDER — SODIUM CHLORIDE 0.9 % IN NEBU
3.0000 mL | INHALATION_SOLUTION | Freq: Once | RESPIRATORY_TRACT | Status: AC
  Administered 2011-08-30: 3 mL via RESPIRATORY_TRACT
  Filled 2011-08-30: qty 3

## 2011-08-30 MED ORDER — METHACHOLINE 16 MG/ML NEB SOLN
2.0000 mL | Freq: Once | RESPIRATORY_TRACT | Status: DC
  Filled 2011-08-30: qty 2

## 2011-08-30 MED ORDER — METHACHOLINE 4 MG/ML NEB SOLN
2.0000 mL | Freq: Once | RESPIRATORY_TRACT | Status: AC
  Administered 2011-08-30: 8 mg via RESPIRATORY_TRACT
  Filled 2011-08-30: qty 2

## 2011-08-30 MED ORDER — METHACHOLINE 1 MG/ML NEB SOLN
2.0000 mL | Freq: Once | RESPIRATORY_TRACT | Status: AC
  Administered 2011-08-30: 2 mg via RESPIRATORY_TRACT
  Filled 2011-08-30: qty 2

## 2011-08-30 MED ORDER — METHACHOLINE 0.0625 MG/ML NEB SOLN
2.0000 mL | Freq: Once | RESPIRATORY_TRACT | Status: AC
  Administered 2011-08-30: 0.125 mg via RESPIRATORY_TRACT
  Filled 2011-08-30: qty 2

## 2011-08-30 MED ORDER — ALBUTEROL SULFATE (5 MG/ML) 0.5% IN NEBU
2.5000 mg | INHALATION_SOLUTION | Freq: Once | RESPIRATORY_TRACT | Status: AC
  Administered 2011-08-30: 2.5 mg via RESPIRATORY_TRACT

## 2011-09-05 ENCOUNTER — Other Ambulatory Visit: Payer: Self-pay | Admitting: *Deleted

## 2011-09-05 DIAGNOSIS — E109 Type 1 diabetes mellitus without complications: Secondary | ICD-10-CM

## 2011-09-05 DIAGNOSIS — I10 Essential (primary) hypertension: Secondary | ICD-10-CM

## 2011-09-05 MED ORDER — NITROGLYCERIN 0.4 MG SL SUBL: 0.4000 mg | SUBLINGUAL_TABLET | SUBLINGUAL | Status: DC | PRN

## 2011-09-05 NOTE — Telephone Encounter (Signed)
Nitrostat rx refilled - request form faxed to [facility],

## 2011-09-07 ENCOUNTER — Telehealth: Payer: Self-pay | Admitting: Internal Medicine

## 2011-09-07 NOTE — Telephone Encounter (Signed)
Not in my look at. Please have them fax it to you right now. I am in my office catching up on charts. I can look at it. Otherwise, tomorrow morning 09/08/11

## 2011-09-07 NOTE — Telephone Encounter (Signed)
Please advise MR, thanks

## 2011-09-07 NOTE — Telephone Encounter (Signed)
Spoke with patient, advised her of results per MR.  MR would like her to come in for appt with TP, so appt scheduled for Fri. September 08, 2011 at 930am. Patient expressed no further concerns at this time.

## 2011-09-07 NOTE — Telephone Encounter (Signed)
Methacholine challenge 08/30/11 strongly positive for asthma. PC20 is at levell 4 dose. She has no $ for copay mdi. Please have her come into see Tammy P and educate on asthma Rx and can go with several samples of advair, symbicort , dulera, pulmicort or qvar of Tammy's choice

## 2011-09-07 NOTE — Telephone Encounter (Signed)
Results given to MR

## 2011-09-07 NOTE — Telephone Encounter (Signed)
Called for results, will hold in triage until fax is received. Thanks

## 2011-09-08 ENCOUNTER — Encounter: Payer: Self-pay | Admitting: Adult Health

## 2011-09-08 ENCOUNTER — Ambulatory Visit (INDEPENDENT_AMBULATORY_CARE_PROVIDER_SITE_OTHER): Payer: Self-pay | Admitting: Adult Health

## 2011-09-08 VITALS — BP 172/112 | HR 80 | Temp 97.0°F | Ht 64.5 in | Wt 192.6 lb

## 2011-09-08 DIAGNOSIS — J45909 Unspecified asthma, uncomplicated: Secondary | ICD-10-CM

## 2011-09-08 DIAGNOSIS — I10 Essential (primary) hypertension: Secondary | ICD-10-CM

## 2011-09-08 MED ORDER — BUDESONIDE-FORMOTEROL FUMARATE 160-4.5 MCG/ACT IN AERO: 2.0000 | INHALATION_SPRAY | Freq: Two times a day (BID) | RESPIRATORY_TRACT | Status: DC

## 2011-09-08 MED ORDER — ALBUTEROL SULFATE HFA 108 (90 BASE) MCG/ACT IN AERS: 1.0000 | INHALATION_SPRAY | Freq: Four times a day (QID) | RESPIRATORY_TRACT | Status: DC | PRN

## 2011-09-08 NOTE — Progress Notes (Signed)
Subjective:    Patient ID: Debra [name], female    DOB: Aug 27, 1965, 46 y.o.   MRN: [id number]  HPI 46 year female.  PCP is PATEL,RAVI, MD, MD .    Diagnosed with asthma 1998/1999 by PMD on basis of symptoms and 'breathing test'. Main symptom is  dyspnea. Reports asthma that is getting worse x 6 years but recently 07/14/11 went to ER with 'asthma attack' and therefore referred here  Feels she does not have capacity to blow things like candle. Triggered by pollen, bleach, dust, perfumes. Associated wheezing +. Dyspnea improved by albuterol somewhat.  Also, dyspneic with exertion for 20 feet and relieved by rest. Feels she needs specialist help because imaging and oxygen levels are normal.  She does report associated wheezing, atypical chest pains,  and cough that is hand in hand with dyspnea. There is associated paroxysmal nocturnal dyspnea at least 1-2 times per night at baseline. Denies CAD history.    Asthma risk: 3 er visits in past 1-2 years. Rx with prednisone +   Current medications: only abulterol prn. Due to lack of insurance, since 2009, not taking scheduled ICS or singulair but she is familiar with the drugs.     family hx: nephew with asthma +  LABS  Ct chest 07/14/11   IMPRESSION:  1. No evidence of pulmonary embolism.  2. No acute findings in the thorax to account for the patient's  symptoms.  3. Mild cardiomegaly.  4. Patulous esophagus.  Original Report Authenticated By: Florencia Reasons, M.D.  Walk test 08/07/2011  185 feet x 3 laps: no desaturation.   09/08/2011 Follow up  Returns for follow up of asthma  Methacholine challenge test positive for asthma .  PFT on 6/13 showed FEV1 1.46 (55%), ratio 77, no sign change with BD. Sign change with BD in mid flows (60%)   DLCO decreased at 53%.  She continues to have on/off wheezing /dyspnea  Uses SABA most days .   Of note pt has extensive hx of uncontrolled HTN followed by PCP  Has had MRI angio abd x 2 -neg for  renal artery stenosis .  Says she is taking her meds regularly , however does not have insurance and  Depends on meds thru pt assistance program-"[place] card"  Today b/p is 172/112 , review shows she avg 170-200/100.  Advised of dangers of these kind of b/p control -ie stroke.  No neuro symptoms but has dull headache.  Of note she is on ACE inhibtor.  Denies drug use. Never smoker.  Previous drug screens in computer are neg.  Denies NSAID or sudafed use.    Review of Systems  Constitutional:   No  weight loss, night sweats,  Fevers, chills,  ++fatigue, or  lassitude.  HEENT:   No  ,  Difficulty swallowing,  Tooth/dental problems, or  Sore throat,                No sneezing, itching, ear ache,  +nasal congestion, post nasal drip,   CV:  No chest pain,  Orthopnea, PND, swelling in lower extremities, anasarca, dizziness, palpitations, syncope.   GI  No heartburn, indigestion, abdominal pain, nausea, vomiting, diarrhea, change in bowel habits, loss of appetite, bloody stools.   Resp   No coughing up of blood.   No chest wall deformity  Skin: no rash or lesions.  GU: no dysuria, change in color of urine, no urgency or frequency.  No flank pain, no hematuria   MS:  No joint pain or swelling.  No decreased range of motion.  No back pain.  Psych:  No change in mood or affect. No depression or anxiety.  No memory loss.         Objective:  GEN: A/Ox3; pleasant , NAD, well nourished   HEENT:  [place]/AT,  EACs-clear, TMs-wnl, NOSE-clear drainage , THROAT-clear, no lesions, no postnasal drip or exudate noted.   NECK:  Supple w/ fair ROM; no JVD; normal carotid impulses w/o bruits; no thyromegaly or nodules palpated; no lymphadenopathy.  RESP  Coarse BS w/o, wheezes/ rales/ or rhonchi.no accessory muscle use, no dullness to percussion  CARD:  RRR, no m/r/g  , tr peripheral edema, pulses intact, no cyanosis or clubbing.  GI:   Soft & nt; nml bowel sounds; no organomegaly or masses  detected.  Musco: Warm bil, no deformities or joint swelling noted.   Neuro: alert, no focal deficits noted.    Skin: Warm, no lesions or rashes          Assessment & Plan:

## 2011-09-08 NOTE — Assessment & Plan Note (Signed)
Severely uncontrolled with extensive workup in past with neg MRI angio x 2 for renal artery stenosis.  She says she has had a metanephrine workup for Pheo. But I could not find these results.  Have advised to seek ER care or PCP regarding her very elevated b/p as she is very high risk for stroke.  She has declined. " I always run this high"  Advised on sign of stroke Avoid nsaid and decongestants.  Will forward to pcp.

## 2011-09-08 NOTE — Assessment & Plan Note (Signed)
Uncontrolled Asthma w/ verified positive methacholine challenge test  Will begin Symbicort 2 puffs Twice daily   Pt education on maintence vs As needed  Inhaler Trigger control.  Big obstacle is pt has no insurance , pt assistance paperwork was completed.  rx and sample given to pt. She has the orange card .  follow up Dr. Marchelle Gearing in 6 weeks and As needed   She is on an ACE inhibitor which is not ideal however she has very uncontrolled HTN at present will  Not change her rx at this time but in future may need to reconsider ACE use in this pt.

## 2011-09-08 NOTE — Patient Instructions (Addendum)
Begin Symbicort 160/4.47mcg 2 puffs Twice daily  -brush/rinse and gargle after use May use Albuterol Inhaler -rescue inhaler -2 puffs every 4 hr as needed for wheezing  follow up Dr. Marchelle Gearing in 2 months and As needed   YOUR BLOOD PRESSURE IS WAY TOO HIGH -DANGEROUSLY HIGH  I RECOMMEND YOU GO TO ER FOR EVALUATION AND TREATMENT You need to follow up with your family doctor regarding your persistently elevated blood pressure  Please contact office for sooner follow up if symptoms do not improve or worsen or seek emergency care

## 2011-09-12 ENCOUNTER — Telehealth: Payer: Self-pay | Admitting: Internal Medicine

## 2011-09-12 MED ORDER — PREDNISONE 10 MG PO TABS: ORAL_TABLET | ORAL | Status: DC

## 2011-09-12 NOTE — Telephone Encounter (Signed)
Send Prednisone taper over next week  Prednisone 10mg  4 tabs for 2 days, then 3 tabs for 2 days, 2 tabs for 2 days, then 1 tab for 2 days, then stop #20  No refills  Cont on current reigmen If not improving will need ov  Make sure she followed up for her HTN  Also on ACE will need to be addressed by PCP  Please contact office for sooner follow up if symptoms do not improve or worsen or seek emergency care

## 2011-09-12 NOTE — Telephone Encounter (Signed)
Called, spoke with pt.  I informed her of below per Tammy.  States she was seen in the [facility] ER for the HTN.  I did verify this was [facility] ER as I do not see this information in pt's chart and she advised she did go to the [facility] ER for the HTN.  She is aware pred rx has been sent to [facility] [address], she will have PCP address ACE, and she will call back or seek emergency care if symptoms worsen or do not improve.  She verbalized understanding of these recs.    Note: The pred rx printed bc it will not go electronically to [facility] - this is where pt originally wanted rx to go to.  As this medication is usually not expensive and pt can pick up today at a different pharm, she would like it sent [facility] [address]  [facility] as it has already been signed by TP.  Pt aware.

## 2011-09-12 NOTE — Telephone Encounter (Signed)
Pt c/o increased cough with scant white mucus, sob, throat irritation, chest tightness since sun., 09/10/11 and she says she has had some wheezing today. Pt says she is using the Symbicort that was started by TP on 09/07/11. Pt says the cough has made her chest sore and it is keeping her up at night. Pls advise. Allergies  Allergen Reactions  . Penicillins Nausea And Vomiting and Rash

## 2011-09-13 ENCOUNTER — Telehealth: Payer: Self-pay | Admitting: Internal Medicine

## 2011-09-13 NOTE — Telephone Encounter (Signed)
[facility] never received [facility] from our office. She states she has called them 3 times today and asked this just be [facility]ed [facility] instead. [facility] and was told they did not [facility] so i refaxed over to the [facility] per pt request. Nothing further was needed

## 2011-09-18 ENCOUNTER — Encounter: Payer: Self-pay | Admitting: Pulmonary Disease

## 2011-09-18 ENCOUNTER — Encounter: Payer: Self-pay | Admitting: Internal Medicine

## 2011-09-18 ENCOUNTER — Ambulatory Visit (INDEPENDENT_AMBULATORY_CARE_PROVIDER_SITE_OTHER): Payer: Self-pay | Admitting: Internal Medicine

## 2011-09-18 ENCOUNTER — Telehealth: Payer: Self-pay | Admitting: Internal Medicine

## 2011-09-18 VITALS — BP 190/72 | HR 85 | Temp 98.5°F | Ht 64.5 in | Wt 194.0 lb

## 2011-09-18 DIAGNOSIS — R05 Cough: Secondary | ICD-10-CM

## 2011-09-18 DIAGNOSIS — I10 Essential (primary) hypertension: Secondary | ICD-10-CM

## 2011-09-18 MED ORDER — CLONIDINE HCL 0.3 MG PO TABS: ORAL_TABLET | ORAL | Status: DC

## 2011-09-18 MED ORDER — TRAMADOL HCL 50 MG PO TABS: ORAL_TABLET | ORAL | Status: AC

## 2011-09-18 MED ORDER — OLMESARTAN MEDOXOMIL-HCTZ 40-25 MG PO TABS: 1.0000 | ORAL_TABLET | Freq: Every day | ORAL | Status: DC

## 2011-09-18 MED ORDER — AMLODIPINE BESYLATE 5 MG PO TABS: ORAL_TABLET | ORAL | Status: DC

## 2011-09-18 NOTE — Patient Instructions (Addendum)
Take delsym two tsp every 12 hours and supplement if needed with  tramadol 50 mg up to 1-2 2 every 4 hours to suppress the urge to cough. Swallowing water or using ice chips/non mint and menthol containing candies (such as lifesavers or sugarless jolly ranchers) are also effective.  You should rest your voice and avoid activities that you know make you cough.  Once you have eliminated the cough for 3 straight days try reducing the tramadol first,  then the delsym as tolerated.    Stop quinapril  and take benicar 40/25 one daily  Change clonidine to 0.3 mg  To one three times a day with meals  Double up on norvasc and take 5 mg twice daily  Only symbiocort if you can't catch breath after you control the cough as above  Please schedule a follow up office visit in 2 weeks, sooner if needed to see Tammy

## 2011-09-18 NOTE — Assessment & Plan Note (Addendum)
ACE inhibitors are problematic in  pts with airway complaints because  even experienced pulmonologists can't always distinguish ace effects from copd/asthma.  By themselves they don't actually cause a problem, much like oxygen can't by itself start a fire, but they certainly serve as a powerful catalyst or enhancer for any "fire"  or inflammatory process in the upper airway, be it caused by an ET  tube or more commonly reflux (especially in the obese or pts with known GERD or who are on biphoshonates).    In the era of ARB near equivalency until we have a better handle on the reversibility of the airway problem, it just makes sense to avoid ACEI  entirely in the short run and then decide later, having established a level of airway control using a reasonable limited regimen, whether to add back ace but even then being very careful to observe the pt for worsening airway control and number of meds used/ needed to control symptoms.    It is not possible to tease out which of her symptoms are asthma vs ACEI related.  BP should be controlled with changes made today but if not next step is add back minoxidil in place of norvasc Clonidine should not be used in doses over 0.3 tid as it is an Alpha one (partial) agonist which is dose related

## 2011-09-18 NOTE — Progress Notes (Signed)
Subjective:    Patient ID: Debra [name], female    DOB: [birth year]    MRN: [id number]  HPI 53 yobf never smoker with poorly controlled asthma typically in winter and summer since 1998 and blood pressure since 2003 on ACEI > stopped 09/18/2011    PCP is PATEL,RAVI, MD, MD .    Diagnosed with asthma 1998/1999 by PMD on basis of symptoms and 'breathing test'. Main symptom is  dyspnea. Reports asthma that is getting worse x 6 years but recently 07/14/11 went to ER with 'asthma attack' and therefore referred here  Feels she does not have capacity to blow things like candle. Triggered by pollen, bleach, dust, perfumes. Associated wheezing +. Dyspnea improved by albuterol somewhat.  Also, dyspneic with exertion for 20 feet and relieved by rest. Feels she needs specialist help because imaging and oxygen levels are normal.  She does report associated wheezing, atypical chest pains,  and cough that is hand in hand with dyspnea. There is associated paroxysmal nocturnal dyspnea at least 1-2 times per night at baseline. Denies CAD history.    Asthma risk: 3 er visits in past 1-2 years. Rx with prednisone +  Current medications: only abulterol prn. Due to lack of insurance, since 2009, not taking scheduled ICS or singulair but she is familiar with the drugs.     family hx: nephew with asthma +  LABS  Ct chest 07/14/11   IMPRESSION:  1. No evidence of pulmonary embolism.  2. No acute findings in the thorax to account for the patient's  symptoms.  3. Mild cardiomegaly.  4. Patulous esophagus.     09/08/2011 F/u ov / NP re asthma  Methacholine challenge test positive for asthma .  PFT on 6/13 showed FEV1 1.46 (55%), ratio 77, no sign change with BD. Sign change with BD in mid flows (60%)   DLCO decreased at 53%.  She continues to have on/off wheezing /dyspnea  Uses SABA most days .   Of note pt has extensive hx of uncontrolled HTN followed by PCP  Has had MRI angio abd x 2 -neg for renal artery  stenosis .  Says she is taking her meds regularly , however does not have insurance and  Depends on meds thru pt assistance program-"[facility] card"  Today b/p is 172/112 , review shows she avg 170-200/100.  Advised of dangers of these kind of b/p control -ie stroke.  No neuro symptoms but has dull headache.  Of note she is on ACE inhibtor.  Denies drug use. Never smoker.  Previous drug screens in computer are neg.  Denies NSAID or sudafed use.  rec Begin Symbicort 160/4.60mcg 2 puffs Twice daily  -brush/rinse and gargle after use May use Albuterol Inhaler -rescue inhaler -2 puffs every 4 hr as needed for wheezing  follow up Dr. Marchelle Gearing in 2 months and As needed  09/18/2011 f/u ov/Fadumo Heng still on ACEI (it's the only medication that controls my BP - note BP 170-200 on ACEI) cc severe coughing fits with gagging, vomiting, clear mucus, despite symbicort and prednisone and saba.  No purulent sputum overt sinus symptoms  Sleeping ok without nocturnal  or early am exacerbation  of respiratory  c/o's or need for noct saba. Also denies any obvious fluctuation of symptoms with weather or environmental changes or other aggravating or alleviating factors except as outlined above   ROS  The following are not active complaints unless bolded sore throat, dysphagia, dental problems, itching, sneezing,  nasal congestion or excess/  purulent secretions, ear ache,   fever, chills, sweats, unintended wt loss, pleuritic or exertional cp, hemoptysis,  orthopnea pnd or leg swelling, presyncope, palpitations, heartburn, abdominal pain, anorexia, nausea, vomiting, diarrhea  or change in bowel or urinary habits, change in stools or urine, dysuria,hematuria,  rash, arthralgias, visual complaints, headache, numbness weakness or ataxia or problems with walking or coordination,  change in mood/affect or memory.                Objective:  GEN: A/Ox3; pleasant , NAD, well nourished   HEENT:  [place]/AT,  EACs-clear,  TMs-wnl, NOSE-clear drainage , THROAT-clear, no lesions, no postnasal drip or exudate noted.   NECK:  Supple w/ fair ROM; no JVD; normal carotid impulses w/o bruits; no thyromegaly or nodules palpated; no lymphadenopathy.  RESP  Completely clear to A and P  CARD:  RRR, no m/r/g  , tr peripheral edema, pulses intact, no cyanosis or clubbing.  GI:   Soft & nt; nml bowel sounds; no organomegaly or masses detected.  Musco: Warm bil, no deformities or joint swelling noted.   Neuro: alert, no focal deficits noted.    Skin: Warm, no lesions or rashes          Assessment & Plan:

## 2011-09-18 NOTE — Telephone Encounter (Signed)
Scheduled pt to see mw today @ 2:15

## 2011-09-18 NOTE — Assessment & Plan Note (Signed)
The most common causes of chronic cough in immunocompetent adults include the following: upper airway cough syndrome (UACS), previously referred to as postnasal drip syndrome (PNDS), which is caused by variety of rhinosinus conditions; (2) asthma; (3) GERD; (4) chronic bronchitis from cigarette smoking or other inhaled environmental irritants; (5) nonasthmatic eosinophilic bronchitis; and (6) bronchiectasis.   These conditions, singly or in combination, have accounted for up to 94% of the causes of chronic cough in prospective studies.   Other conditions have constituted no >6% of the causes in prospective studies These have included bronchogenic carcinoma, chronic interstitial pneumonia, sarcoidosis, left ventricular failure, ACEI-induced cough, and aspiration from a condition associated with pharyngeal dysfunction.   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   These are the same pts (now being labeled as having "irritable larynx syndrome" by some cough centers) who not infrequently have a history of having failed to tolerate ace inhibitors,  dry powder inhalers or biphosphonates or report having atypical reflux symptoms that don't respond to standard doses of PPI , and are easily confused as having aecopd or asthma flares by even experienced allergists/ pulmonologists.   Will first try off acei and suppress cyclical cough with tramadol.   See instructions for specific recommendations which were reviewed directly with the patient who was given a copy with  highlighter outlining the key components.

## 2011-10-02 ENCOUNTER — Ambulatory Visit: Payer: Self-pay | Admitting: Adult Health

## 2011-10-03 ENCOUNTER — Telehealth: Payer: Self-pay | Admitting: Internal Medicine

## 2011-10-03 NOTE — Telephone Encounter (Signed)
Methacholine challenge 08/30/11 positive for asthma very strongly. Subsequent to this on 09/18/11 Dr Sherene Sires stopped aCe ihibitor. When I see her in sept 2013 I will do spirometry

## 2011-11-10 ENCOUNTER — Ambulatory Visit: Payer: Self-pay | Admitting: Internal Medicine

## 2011-12-15 ENCOUNTER — Other Ambulatory Visit: Payer: Self-pay | Admitting: *Deleted

## 2011-12-18 MED ORDER — GABAPENTIN 400 MG PO CAPS: 400.0000 mg | ORAL_CAPSULE | Freq: Three times a day (TID) | ORAL | Status: DC

## 2011-12-20 ENCOUNTER — Other Ambulatory Visit: Payer: Self-pay | Admitting: *Deleted

## 2011-12-20 DIAGNOSIS — I10 Essential (primary) hypertension: Secondary | ICD-10-CM

## 2011-12-20 DIAGNOSIS — E109 Type 1 diabetes mellitus without complications: Secondary | ICD-10-CM

## 2011-12-20 MED ORDER — NITROGLYCERIN 0.4 MG SL SUBL: 0.4000 mg | SUBLINGUAL_TABLET | SUBLINGUAL | Status: DC | PRN

## 2011-12-20 NOTE — Telephone Encounter (Signed)
Pt stated she does have some pills left; wants to talk w/ Dr Allena Katz before scheduling an appt w/her cardiologist. Stated she has not seen one since 2008 or 2009.

## 2011-12-20 NOTE — Telephone Encounter (Signed)
rx faxed in 

## 2011-12-20 NOTE — Telephone Encounter (Signed)
Per [facility] - last dispensed 09/07/11; wants to know if pt should be using 100 tabs in 3 months?

## 2011-12-20 NOTE — Telephone Encounter (Signed)
Rx faxed to [facility]. Pt scheduled an appt to see Dr Allena Katz 11/6.

## 2012-01-03 ENCOUNTER — Encounter: Payer: Self-pay | Admitting: Internal Medicine

## 2012-01-03 ENCOUNTER — Ambulatory Visit (INDEPENDENT_AMBULATORY_CARE_PROVIDER_SITE_OTHER): Payer: Self-pay | Admitting: Internal Medicine

## 2012-01-03 VITALS — BP 182/93 | HR 91 | Temp 98.3°F | Ht 64.5 in | Wt 199.2 lb

## 2012-01-03 DIAGNOSIS — M722 Plantar fascial fibromatosis: Secondary | ICD-10-CM

## 2012-01-03 DIAGNOSIS — E1039 Type 1 diabetes mellitus with other diabetic ophthalmic complication: Secondary | ICD-10-CM

## 2012-01-03 DIAGNOSIS — M6530 Trigger finger, unspecified finger: Secondary | ICD-10-CM

## 2012-01-03 DIAGNOSIS — Z79899 Other long term (current) drug therapy: Secondary | ICD-10-CM

## 2012-01-03 DIAGNOSIS — I10 Essential (primary) hypertension: Secondary | ICD-10-CM

## 2012-01-03 DIAGNOSIS — E109 Type 1 diabetes mellitus without complications: Secondary | ICD-10-CM

## 2012-01-03 DIAGNOSIS — R05 Cough: Secondary | ICD-10-CM

## 2012-01-03 DIAGNOSIS — Z23 Encounter for immunization: Secondary | ICD-10-CM

## 2012-01-03 DIAGNOSIS — J45909 Unspecified asthma, uncomplicated: Secondary | ICD-10-CM

## 2012-01-03 DIAGNOSIS — G8929 Other chronic pain: Secondary | ICD-10-CM

## 2012-01-03 DIAGNOSIS — H538 Other visual disturbances: Secondary | ICD-10-CM

## 2012-01-03 LAB — POCT GLYCOSYLATED HEMOGLOBIN (HGB A1C): Hemoglobin A1C: 10

## 2012-01-03 MED ORDER — OLMESARTAN MEDOXOMIL-HCTZ 40-25 MG PO TABS: 1.0000 | ORAL_TABLET | Freq: Every day | ORAL | Status: DC

## 2012-01-03 NOTE — Progress Notes (Signed)
  Subjective:    Patient ID: Debra [name], female    DOB: [birth year], 46 y.o.   MRN: [id number]  HPI patient is a pleasant 46 year old with past history of uncontrolled DM 1, uncontrolled hypertension, multiple hand and foot surgeries who comes to the clinic for followup visit. Her A1c today is 10.0. Her blood pressure is 182/93. She is on Lantus 100 units at bedtime and NovoLog sliding scale. She has tried multiple things for diabetes and she has type 1 diabetes since she was 11. Nothing has helped her much for long period. - Her systolic blood pressure stays in 180's-  200s most of the time. It has been hard to manage. She is currently on Norvasc, clonidine, Imdur, Aldactone. Recently ACE inhibitor was stopped due to chronic cough- per her pulmonologist- Dr. Sherene Sires. She was advised to be on ARB- which is apparently not on that. - She is a referral for hand and foot problems. She has seen Dr. Jennette Kettle at sports medicine clinic last year for this and had steroid injections in her hand and then was referred to [facility] for surgery- which did not happen because her blood pressure was severely high.  - She also reports that she sees red in her left eye and feels that her blood vessel has been broken. She has had this in past and has proliferative diabetic retinopathy which needed photocoagulation. Last eye exam by ophthalmologist was in January 2013- which reported stable proliferative retinopathy.  Patient denies any fever, chills, abdominal pain, chest pain, short of breath.  Review of Systems    as per history of present illness, all other systems reviewed and negative. Objective:   Physical Exam  General: NAD HEENT: PERRL, EOMI, no scleral icterus. Direct ophthalmoscopy limited. Not much seen. Cardiac: S1-S2, RRR, no rubs, murmurs or gallops Pulm: clear to auscultation bilaterally, moving normal volumes of air Abd: soft, nontender, nondistended, BS present Ext: warm and well perfused, no  pedal edema Neuro: alert and oriented X3, cranial nerves II-XII grossly intact       Assessment & Plan:

## 2012-01-03 NOTE — Assessment & Plan Note (Signed)
Orthotics prescribed by Dr. Jennette Kettle with sports medicine clinic last year which helped some. Patient started having pain again. Will refer back to sports medicine clinic for further help managing this patient's pain. Would highly appreciate the help.

## 2012-01-03 NOTE — Patient Instructions (Signed)
Please make followup appointment in 10-14 days with me. We'll try to get Benicar from [facility] for your blood pressure.  Continue taking all medications regularly including insulin.  Followup with sports medicine and ophthalmology.

## 2012-01-03 NOTE — Assessment & Plan Note (Signed)
Lab Results  Component Value Date   HGBA1C 10.0 01/03/2012   CREATININE 0.57 07/14/2011   CREATININE 0.73 05/25/2011   MICROALBUR 6.11* 05/25/2011   MICRALBCREAT 68.9* 05/25/2011   CHOL 182 07/26/2010   HDL 45 07/26/2010   TRIG 112 07/26/2010    Last eye exam and foot exam: No results found for this basename: HMDIABEYEEXA, HMDIABFOOTEX    Assessment: Diabetes control: not controlled Progress toward goals: unchanged Barriers to meeting goals: Bad diabetes control for long time. Have tried many measures. Will try to increase the dose of insulin during next visit.  Plan: Diabetes treatment: continue current medications Refer to: none Instruction/counseling given: reminded to get eye exam, discussed foot care and discussed diet

## 2012-01-03 NOTE — Assessment & Plan Note (Addendum)
Referral to Dr. Jennette Kettle with sports medicine who saw patient last year. Patient needs surgery for trigger finger which was arranged at [facility] per Dr. Jennette Kettle- but did not happen due to high blood pressure. We'll try to control blood pressure before rereferral. Although  would appreciate Dr. Donnetta Hail help  again  2 help manage her hand and foot pain.

## 2012-01-03 NOTE — Assessment & Plan Note (Signed)
Worsening left eye vision with red spots. Patient feels like she had a bleeding vessel. Refer to ophthalmology. If not available in [place], with need to go to [facility] urgently. Discussed this with patient. She is agreeable to the plan.

## 2012-01-03 NOTE — Assessment & Plan Note (Signed)
Resolved after stopping ACE inhibitor. Will add ARB.

## 2012-01-03 NOTE — Assessment & Plan Note (Signed)
Cough and asthma symptoms much well controlled now. Highly appreciate Dr. Thurston Hole help. Patient off ACE inhibitor and feels better.

## 2012-01-12 ENCOUNTER — Ambulatory Visit (HOSPITAL_COMMUNITY)
Admission: RE | Admit: 2012-01-12 | Discharge: 2012-01-12 | Disposition: A | Discharge status: Home / Self Care | Payer: Self-pay | Source: Ambulatory Visit | Attending: Family Medicine | Admitting: Family Medicine

## 2012-01-12 ENCOUNTER — Other Ambulatory Visit (INDEPENDENT_AMBULATORY_CARE_PROVIDER_SITE_OTHER): Payer: Self-pay

## 2012-01-12 ENCOUNTER — Ambulatory Visit (INDEPENDENT_AMBULATORY_CARE_PROVIDER_SITE_OTHER): Payer: Self-pay | Admitting: Family Medicine

## 2012-01-12 ENCOUNTER — Encounter: Payer: Self-pay | Admitting: Family Medicine

## 2012-01-12 VITALS — BP 183/99 | HR 85 | Ht 64.5 in | Wt 199.0 lb

## 2012-01-12 DIAGNOSIS — M169 Osteoarthritis of hip, unspecified: Secondary | ICD-10-CM | POA: Insufficient documentation

## 2012-01-12 DIAGNOSIS — M25552 Pain in left hip: Secondary | ICD-10-CM

## 2012-01-12 DIAGNOSIS — M1610 Unilateral primary osteoarthritis, unspecified hip: Secondary | ICD-10-CM | POA: Insufficient documentation

## 2012-01-12 DIAGNOSIS — M25569 Pain in unspecified knee: Secondary | ICD-10-CM | POA: Insufficient documentation

## 2012-01-12 DIAGNOSIS — M359 Systemic involvement of connective tissue, unspecified: Secondary | ICD-10-CM | POA: Insufficient documentation

## 2012-01-12 DIAGNOSIS — M25559 Pain in unspecified hip: Secondary | ICD-10-CM

## 2012-01-12 DIAGNOSIS — M25551 Pain in right hip: Secondary | ICD-10-CM

## 2012-01-12 DIAGNOSIS — M1710 Unilateral primary osteoarthritis, unspecified knee: Secondary | ICD-10-CM | POA: Insufficient documentation

## 2012-01-12 DIAGNOSIS — IMO0002 Reserved for concepts with insufficient information to code with codable children: Secondary | ICD-10-CM | POA: Insufficient documentation

## 2012-01-12 LAB — COMPREHENSIVE METABOLIC PANEL
AST: 21 U/L (ref 0–37)
Alkaline Phosphatase: 92 U/L (ref 39–117)
BUN: 9 mg/dL (ref 6–23)
Glucose, Bld: 173 mg/dL — ABNORMAL HIGH (ref 70–99)
Sodium: 138 mEq/L (ref 135–145)
Total Bilirubin: 0.4 mg/dL (ref 0.3–1.2)
Total Protein: 6.8 g/dL (ref 6.0–8.3)

## 2012-01-12 LAB — CBC WITH DIFFERENTIAL/PLATELET
Basophils Relative: 1 % (ref 0–1)
Eosinophils Absolute: 0.3 10*3/uL (ref 0.0–0.7)
Eosinophils Relative: 4 % (ref 0–5)
HCT: 41.2 % (ref 36.0–46.0)
Hemoglobin: 13.9 g/dL (ref 12.0–15.0)
MCH: 28.5 pg (ref 26.0–34.0)
MCHC: 33.7 g/dL (ref 30.0–36.0)
MCV: 84.4 fL (ref 78.0–100.0)
Monocytes Absolute: 0.7 10*3/uL (ref 0.1–1.0)
Monocytes Relative: 8 % (ref 3–12)
RDW: 14.7 % (ref 11.5–15.5)

## 2012-01-12 LAB — CK: Total CK: 158 U/L (ref 7–177)

## 2012-01-12 MED ORDER — DULOXETINE HCL 30 MG PO CPEP: 30.0000 mg | ORAL_CAPSULE | Freq: Every day | ORAL | Status: DC

## 2012-01-12 NOTE — Assessment & Plan Note (Signed)
I will begin rheumatological workup with plans to set her up with rheumatology once I get an idea of what path for taking here. In the interim I will try her on Cymbalta to see if we can get some pain relief. She has been on that before with good result. I reviewed her chart with her in clinic today including that of her deceased mother Hilaria Ota, date of birth [birth year]. Given her mothers fairly acute onset of idiopathic renal failure, I wonder if there is a family history of lupus. She's given me permission to further look into her mom chart.  I'll order beginning rheumatological screen blood work, hip films and knee films. I'll see her back in 2 weeks

## 2012-01-12 NOTE — Progress Notes (Signed)
  Subjective:    Patient ID: Debra [name], female    DOB: 07-30-1965, 46 y.o.   MRN: [id number]  HPI Several months of worsening bilateral hip knee and foot pain. The other night she was sitting on the floor and could not get up because her legs really would not work. This concerned her quite a bit. The pain in her hips is achy, worse in the morning but never totally resolved. The knees her with extended periods of walking, or sitting for a long time. Her feet burn and tingle much of the time. Significantly worse.Over the last several months.  Pertinent past medical history includes type 1 diabetes, uncontrolled hypertension, asthma in a never smoker. Pertinent family history includes acute renal failure in her mother at 82 years of age. Review of Systems As unusual weight change, fever, sweats, chills. She's noted no unusual rash.    Objective:   Physical Exam Vital signs are reviewed. Significantly noted is her blood pressure of 183/99 which she reports is Better than her baseline which is typically systolic greater than 200. GENERAL: Well-developed overweight female no acute distress. She is using a cane to walk. HIPS: Internal and external rotation is painless in intact with full range of motion. She has some pain with external rotation bilaterally. She has difficulty rising from a chair without the assistance of both upper extremities in her cane. She walks with a wide-based gait. Her thigh muscles seem to have intact 5 out of 5 strength S2 were hamstrings. KNEES: Diffusely tender but no significant medial or lateral joint line tenderness. Full extension and flexion. Her extension seems somewhat stiffened. The popliteal space is soft and the calf is benign. FEET: Decreased sensation to soft touch.      Assessment & Plan:

## 2012-01-15 LAB — ANTIPHOSPHOLIPID SYNDROME EVAL, BLD
Anticardiolipin IgG: 9 GPL U/mL (ref <23)
DRVVT: 30.4 secs (ref <42.9)
Lupus Anticoagulant: NOT DETECTED

## 2012-01-15 LAB — ANTI-SMITH ANTIBODY: ENA SM Ab Ser-aCnc: 2 AU/mL (ref <30)

## 2012-01-16 ENCOUNTER — Encounter: Payer: Self-pay | Admitting: Family Medicine

## 2012-02-02 ENCOUNTER — Ambulatory Visit: Payer: Self-pay

## 2012-02-02 ENCOUNTER — Ambulatory Visit: Payer: Self-pay | Admitting: Family Medicine

## 2012-02-16 ENCOUNTER — Ambulatory Visit (INDEPENDENT_AMBULATORY_CARE_PROVIDER_SITE_OTHER): Payer: No Typology Code available for payment source | Admitting: Family Medicine

## 2012-02-16 VITALS — BP 164/108 | Ht 64.0 in | Wt 192.0 lb

## 2012-02-16 DIAGNOSIS — M25569 Pain in unspecified knee: Secondary | ICD-10-CM

## 2012-02-16 DIAGNOSIS — M25562 Pain in left knee: Secondary | ICD-10-CM

## 2012-02-16 NOTE — Progress Notes (Signed)
Chief complaint: Bilateral knee pain  History of present illness: Patient is a 46 year old female coming back for bilateral knee pain. Patient has had multiple muscle skeletal complaints. Patient did have a rheumatological workup which was negative. Patient did have x-rays of her knee that showed mild osteoarthritic changes but otherwise fairly unremarkable. Patient states that she still is having pain in her knees bilaterally sometimes worse at night. Patient states she is still able to do all her activities of daily living but finds it very difficult. Recent states that there is a lot of clicking, popping, and sometimes feels like kidneys would be getting on her. Patient denies any injury, denies any radiation of pain and denies any numbness in the extremities.  Past medical history, social, surgical and family history all reviewed.   Review of systems was done and unremarkable as related to the orthopedic problem.  Physical exam Blood pressure 164/108, height 5\' 4"  (1.626 m), weight 192 lb (87.091 kg). General: No apparent distress alert and oriented x3 mood and affect somewhat normal but blunted. Respiratory: Patient's recent full sentences and does not appear short of breath Skin: Warm dry intact with no signs of infection or rash Neuro: Cranial nerves II through XII are intact, neurovascularly intact in all extremities with 2+ DTRs and 2+ pulses. Knees: Patient is still diffusely tender over the medial and lateral joint line bilaterally. She does have full range of motion with minimal crepitus. Patient's calf is soft and nontender. She is neurovascularly intact distally with 2+ DTRs.  Muscle skeletal ultrasound was performed and interpreted by me today. Patient's ultrasound is unremarkable with no hypoechoic changes. Patient's lateral compartment on the left knee does have an overlying subcutaneous fat pad but no gross abnormality or any intra-articular abnormality noted. Patient's meniscus  appeared to be normal with no signs of acute or degenerative tears.

## 2012-02-16 NOTE — Assessment & Plan Note (Signed)
Procedure note After verbal and written consent given pt was prepped with betadine.  1:3 kenalog 40 to lidocaine used in bilateral knees.  Pt minimal bleeding dressed with band aid, Pt given red flags to look for pt had better pain control immediatly.    Patient given home exercise program that she was doing a regular basis. Patient can take anti-inflammatories and Tylenol on an as-needed basis. Patient will followup in 4 weeks for further evaluation. I do not think she does have a meniscal tear and I think unfortunately all this is do to decompensation in the conditioning of her musculature. In addition this patient has gained weight over the course of last year which is putting more stress on her knees. We'll continue to monitor and help where we can.

## 2012-03-14 NOTE — Addendum Note (Signed)
Addended by: Remus Blake on: 03/14/2012 09:19 AM   Modules accepted: Orders

## 2012-03-15 ENCOUNTER — Other Ambulatory Visit: Payer: Self-pay | Admitting: *Deleted

## 2012-03-15 MED ORDER — INSULIN LISPRO 100 UNIT/ML ~~LOC~~ SOLN: 0.0000 [IU] | Freq: Three times a day (TID) | SUBCUTANEOUS | Status: DC

## 2012-03-20 NOTE — Telephone Encounter (Signed)
Directions on insulin does not make sense, sliding scale along with 20 units... ??? Please clarify, thanks

## 2012-04-16 ENCOUNTER — Other Ambulatory Visit: Payer: Self-pay | Admitting: *Deleted

## 2012-04-16 MED ORDER — AMLODIPINE BESYLATE 5 MG PO TABS: ORAL_TABLET | ORAL | Status: DC

## 2012-04-16 NOTE — Telephone Encounter (Signed)
I see you just refilled but did not enter amount for refills.  Also refill request was for norvasc 10 mg once daily.  Did you want to change these directions???

## 2012-04-16 NOTE — Telephone Encounter (Signed)
Faxed in

## 2012-05-23 ENCOUNTER — Encounter: Payer: No Typology Code available for payment source | Admitting: Dietician

## 2012-05-28 ENCOUNTER — Other Ambulatory Visit: Payer: Self-pay | Admitting: Internal Medicine

## 2012-05-28 DIAGNOSIS — E1039 Type 1 diabetes mellitus with other diabetic ophthalmic complication: Secondary | ICD-10-CM

## 2012-06-19 ENCOUNTER — Other Ambulatory Visit: Payer: Self-pay | Admitting: *Deleted

## 2012-06-19 DIAGNOSIS — I10 Essential (primary) hypertension: Secondary | ICD-10-CM

## 2012-06-19 MED ORDER — INSULIN GLARGINE 100 UNIT/ML ~~LOC~~ SOLN: 100.0000 [IU] | Freq: Every day | SUBCUTANEOUS | Status: DC

## 2012-06-19 MED ORDER — SPIRONOLACTONE 100 MG PO TABS: 100.0000 mg | ORAL_TABLET | Freq: Every day | ORAL | Status: DC

## 2012-06-19 NOTE — Telephone Encounter (Signed)
[facility] MAP informed of Aldactone and Lantus solostar rx refills.

## 2012-06-20 ENCOUNTER — Other Ambulatory Visit: Payer: Self-pay | Admitting: Internal Medicine

## 2012-06-20 MED ORDER — GABAPENTIN 400 MG PO CAPS: 400.0000 mg | ORAL_CAPSULE | Freq: Three times a day (TID) | ORAL | Status: DC

## 2012-06-20 MED ORDER — ALBUTEROL SULFATE HFA 108 (90 BASE) MCG/ACT IN AERS: 1.0000 | INHALATION_SPRAY | Freq: Four times a day (QID) | RESPIRATORY_TRACT | Status: DC | PRN

## 2012-06-20 NOTE — Telephone Encounter (Signed)
Received faxed refill request for Proventil HFA 1-2 puffs every 6 hours as needed for wheezing for shortness of breath Last ov 7.22.13 w/ MW for acute, follow up in 2 weeks Pt cancelled 8.5.13 ov w/ TP and noshowed for 9.13.13 ov w/ MR No upcoming appts Will refill with 1 additional refill and note that pt is overdue for appt [facility] Dept does not accept e-rx > e-faxed and paper faxed back to [phone]

## 2012-06-20 NOTE — Telephone Encounter (Signed)
Rx faxed in.

## 2012-06-28 ENCOUNTER — Other Ambulatory Visit: Payer: Self-pay | Admitting: Internal Medicine

## 2012-06-28 DIAGNOSIS — E1039 Type 1 diabetes mellitus with other diabetic ophthalmic complication: Secondary | ICD-10-CM

## 2012-07-09 ENCOUNTER — Encounter: Payer: Self-pay | Admitting: Dietician

## 2012-08-15 ENCOUNTER — Ambulatory Visit: Payer: Self-pay

## 2012-08-16 ENCOUNTER — Ambulatory Visit: Payer: Self-pay

## 2012-09-05 ENCOUNTER — Other Ambulatory Visit: Payer: Self-pay

## 2012-09-09 ENCOUNTER — Other Ambulatory Visit: Payer: Self-pay | Admitting: Adult Health

## 2012-09-09 NOTE — Telephone Encounter (Signed)
Received faxed refill request from [facility] Dept for symbicort [id number]  Last ov:  6.10.13 w/ MR 7.12.13 w/ TP >> follow up in 6 weeks with MR 7.22.13 w/ MW for acute >> follow up in 2 weeks with TP  No pending ov's at this time Will refill x1 with note that pt needs ov for further refills [facility] Dept does not accept e-rx >> rx faxed to [phone] and sent to be scanned into pt's chart

## 2012-09-16 ENCOUNTER — Encounter: Payer: Self-pay | Admitting: Family Medicine

## 2012-09-16 ENCOUNTER — Ambulatory Visit (INDEPENDENT_AMBULATORY_CARE_PROVIDER_SITE_OTHER): Payer: No Typology Code available for payment source | Admitting: Family Medicine

## 2012-09-16 VITALS — BP 188/94 | HR 84 | Ht 64.0 in | Wt 192.0 lb

## 2012-09-16 DIAGNOSIS — M25561 Pain in right knee: Secondary | ICD-10-CM

## 2012-09-16 DIAGNOSIS — M25569 Pain in unspecified knee: Secondary | ICD-10-CM

## 2012-09-16 MED ORDER — METHYLPREDNISOLONE ACETATE 40 MG/ML IJ SUSP
40.0000 mg | Freq: Once | INTRAMUSCULAR | Status: AC
  Administered 2012-09-16: 40 mg via INTRA_ARTICULAR

## 2012-09-16 MED ORDER — METHYLPREDNISOLONE ACETATE 40 MG/ML IJ SUSP: 40.0000 mg | Freq: Once | INTRAMUSCULAR | Status: DC

## 2012-09-17 ENCOUNTER — Telehealth: Payer: Self-pay | Admitting: *Deleted

## 2012-09-17 NOTE — Telephone Encounter (Signed)
Message copied by Mora Bellman on Tue Sep 17, 2012  4:10 PM ------      Message from: Lizbeth Bark      Created: Tue Sep 17, 2012  9:25 AM      Regarding: phone message      Contact: [phone]       Pt called stating her left knee is painful and is popping when she walking since having injection yesterday. ------

## 2012-09-17 NOTE — Telephone Encounter (Signed)
Called pt back, she states her knee is painful, and popping with walking since yesterday after injection.  She denies and swelling or redness of area.  Advised it may be related to the additional fluid that was injected into the knee. Advised icing and tylenol (pt unable to take NSAIDs due to htn), also told her steroid may take up to 7 days to become effective.  Asked her to call back tomorrow if pain is no better.

## 2012-09-18 ENCOUNTER — Encounter: Payer: Self-pay | Admitting: Family Medicine

## 2012-09-18 NOTE — Assessment & Plan Note (Signed)
She got 6 months of relief of the last corticosteroid injections. We will do her second set today and she'll followup when necessary

## 2012-09-18 NOTE — Progress Notes (Signed)
  Subjective:    Patient ID: Debra [name], female    DOB: May 31, 1965, 47 y.o.   MRN: [id number]  HPI Bilateral knee pain. She had bilateral corticosteroid injections 6 months ago and had significant relief until about 3 weeks ago when she started noticing some stiffness in the anterior knee pain. She would like to have repeat injections.   Review of Systems Denies seeing any erythema or feeling any warmth of the knee. Has had no knee swelling, no fever.    Objective:   Physical Exam  Vital signs are reviewed GENERAL: Well-developed female no acute distress KNEES: Bilaterally there is no evidence of effusion, no erythema, no warmth. She has full range of motion in flexion and extension. Medial joint line tenderness bilaterally on the left greater than right knee. Popliteal space is benign. Calf is soft. Distally she is neurovascularly intact  INJECTION: Patient was given informed consent, signed copy in the chart. Appropriate time out was taken. Area prepped and draped in usual sterile fashion. One cc of methylprednisolone 40 mg/ml plus  4 cc of 1% lidocaine without epinephrine was injected into the bilateral knees using a(n) anterior medial approach. The patient tolerated the procedure well. There were no complications. Post procedure instructions were given.       Assessment & Plan:

## 2012-10-15 ENCOUNTER — Encounter: Payer: Self-pay | Admitting: Internal Medicine

## 2012-10-15 ENCOUNTER — Ambulatory Visit (INDEPENDENT_AMBULATORY_CARE_PROVIDER_SITE_OTHER): Payer: No Typology Code available for payment source | Admitting: Internal Medicine

## 2012-10-15 ENCOUNTER — Ambulatory Visit (INDEPENDENT_AMBULATORY_CARE_PROVIDER_SITE_OTHER): Payer: No Typology Code available for payment source | Admitting: Dietician

## 2012-10-15 ENCOUNTER — Encounter: Payer: Self-pay | Admitting: Dietician

## 2012-10-15 VITALS — BP 148/77 | HR 88 | Temp 97.1°F | Ht 64.0 in | Wt 209.0 lb

## 2012-10-15 DIAGNOSIS — E1039 Type 1 diabetes mellitus with other diabetic ophthalmic complication: Secondary | ICD-10-CM

## 2012-10-15 DIAGNOSIS — F329 Major depressive disorder, single episode, unspecified: Secondary | ICD-10-CM

## 2012-10-15 DIAGNOSIS — R197 Diarrhea, unspecified: Secondary | ICD-10-CM

## 2012-10-15 DIAGNOSIS — Z9181 History of falling: Secondary | ICD-10-CM

## 2012-10-15 DIAGNOSIS — F3289 Other specified depressive episodes: Secondary | ICD-10-CM

## 2012-10-15 DIAGNOSIS — H579 Unspecified disorder of eye and adnexa: Secondary | ICD-10-CM

## 2012-10-15 DIAGNOSIS — E1139 Type 2 diabetes mellitus with other diabetic ophthalmic complication: Secondary | ICD-10-CM

## 2012-10-15 DIAGNOSIS — K529 Noninfective gastroenteritis and colitis, unspecified: Secondary | ICD-10-CM

## 2012-10-15 DIAGNOSIS — I10 Essential (primary) hypertension: Secondary | ICD-10-CM

## 2012-10-15 DIAGNOSIS — R296 Repeated falls: Secondary | ICD-10-CM | POA: Insufficient documentation

## 2012-10-15 LAB — LIPID PANEL
Cholesterol: 158 mg/dL (ref 0–200)
HDL: 48 mg/dL (ref >39)
LDL Cholesterol: 90 mg/dL (ref 0–99)
Total CHOL/HDL Ratio: 3.3 Ratio
Triglycerides: 100 mg/dL (ref <150)
VLDL: 20 mg/dL (ref 0–40)

## 2012-10-15 LAB — GLUCOSE, CAPILLARY: Glucose-Capillary: 153 mg/dL — ABNORMAL HIGH (ref 70–99)

## 2012-10-15 MED ORDER — INSULIN GLARGINE 100 UNIT/ML ~~LOC~~ SOLN: 68.0000 [IU] | Freq: Every day | SUBCUTANEOUS | Status: DC

## 2012-10-15 NOTE — Patient Instructions (Addendum)
New insulin doses  For this week until we talk on Friday:    Humalog - please decrease this to 16 units with each meal Lantus - please decrease this to 66 units at bedtime  Please check blood sugars 4 times a day for Tuesday/wednesday and Thursday and call me with these numbers on Friday at [phone].   For diarrhea:   Lowering blood sugar and trying to decrease the swings in your blood sugars  Kefir milk and [place] yogurt- 2 8 oz servings a day Prescriptions- Lactinex or Bacid

## 2012-10-15 NOTE — Patient Instructions (Addendum)
Please see Debra [name] today, follow-up with Dr. Aundria Rud in 1 month.   Please try to remember your meidinces, glucometer, and blood pressure log at your next visit.    We will get records from Dr. Regino Schultze office then refer you to a GI specialist at [facility].  They will call you with the appointment time.   Diabetes Meal Planning Guide The diabetes meal planning guide is a tool to help you plan your meals and snacks. It is important for people with diabetes to manage their blood glucose (sugar) levels. Choosing the right foods and the right amounts throughout your day will help control your blood glucose. Eating right can even help you improve your blood pressure and reach or maintain a healthy weight. CARBOHYDRATE COUNTING MADE EASY When you eat carbohydrates, they turn to sugar. This raises your blood glucose level. Counting carbohydrates can help you control this level so you feel better. When you plan your meals by counting carbohydrates, you can have more flexibility in what you eat and balance your medicine with your food intake. Carbohydrate counting simply means adding up the total amount of carbohydrate grams in your meals and snacks. Try to eat about the same amount at each meal. Foods with carbohydrates are listed below. Each portion below is 1 carbohydrate serving or 15 grams of carbohydrates. Ask your dietician how many grams of carbohydrates you should eat at each meal or snack. Grains and Starches  1 slice bread.   English muffin or hotdog/hamburger bun.   cup cold cereal (unsweetened).   cup cooked pasta or rice.   cup starchy vegetables (corn, potatoes, peas, beans, winter squash).  1 tortilla (6 inches).   bagel.  1 waffle or pancake (size of a CD).   cup cooked cereal.  4 to 6 small crackers. *Whole grain is recommended. Fruit  1 cup fresh unsweetened berries, melon, papaya, pineapple.  1 small fresh fruit.   banana or  mango.   cup fruit juice (4 oz unsweetened).   cup canned fruit in natural juice or water.  2 tbs dried fruit.  12 to 15 grapes or cherries. Milk and Yogurt  1 cup fat-free or 1% milk.  1 cup soy milk.  6 oz light yogurt with sugar-free sweetener.  6 oz low-fat soy yogurt.  6 oz plain yogurt. Vegetables  1 cup raw or  cup cooked is counted as 0 carbohydrates or a "free" food.  If you eat 3 or more servings at 1 meal, count them as 1 carbohydrate serving. Other Carbohydrates   oz chips or pretzels.   cup ice cream or frozen yogurt.   cup sherbet or sorbet.  2 inch square cake, no frosting.  1 tbs honey, sugar, jam, jelly, or syrup.  2 small cookies.  3 squares of graham crackers.  3 cups popcorn.  6 crackers.  1 cup broth-based soup.  Count 1 cup casserole or other mixed foods as 2 carbohydrate servings.  Foods with less than 20 calories in a serving may be counted as 0 carbohydrates or a "free" food. You may want to purchase a book or computer software that lists the carbohydrate gram counts of different foods. In addition, the nutrition facts panel on the labels of the foods you eat are a good source of this information. The label will tell you how big the serving size is and the total number of carbohydrate grams you will be eating per serving. Divide this number by 15  to obtain the number of carbohydrate servings in a portion. Remember, 1 carbohydrate serving equals 15 grams of carbohydrate. SERVING SIZES Measuring foods and serving sizes helps you make sure you are getting the right amount of food. The list below tells how big or small some common serving sizes are.  1 oz.........4 stacked dice.  3 oz........Marland KitchenDeck of cards.  1 tsp.......Marland KitchenTip of little finger.  1 tbs......Marland KitchenMarland KitchenThumb.  2 tbs.......Marland KitchenGolf ball.   cup......Marland KitchenHalf of a fist.  1 cup.......Marland KitchenA fist. SAMPLE DIABETES MEAL PLAN Below is a sample meal plan that includes foods from the  grain and starches, dairy, vegetable, fruit, and meat groups. A dietician can individualize a meal plan to fit your calorie needs and tell you the number of servings needed from each food group. However, controlling the total amount of carbohydrates in your meal or snack is more important than making sure you include all of the food groups at every meal. You may interchange carbohydrate containing foods (dairy, starches, and fruits). The meal plan below is an example of a 2000 calorie diet using carbohydrate counting. This meal plan has 17 carbohydrate servings. Breakfast  1 cup oatmeal (2 carb servings).   cup light yogurt (1 carb serving).  1 cup blueberries (1 carb serving).   cup almonds. Snack  1 large apple (2 carb servings).  1 low-fat string cheese stick. Lunch  Chicken breast salad.  1 cup spinach.   cup chopped tomatoes.  2 oz chicken breast, sliced.  2 tbs low-fat [place] dressing.  12 whole-wheat crackers (2 carb servings).  12 to 15 grapes (1 carb serving).  1 cup low-fat milk (1 carb serving). Snack  1 cup carrots.   cup hummus (1 carb serving). Dinner  3 oz broiled salmon.  1 cup brown rice (3 carb servings). Snack  1  cups steamed broccoli (1 carb serving) drizzled with 1 tsp olive oil and lemon juice.  1 cup light pudding (2 carb servings). DIABETES MEAL PLANNING WORKSHEET Your dietician can use this worksheet to help you decide how many servings of foods and what types of foods are right for you.  BREAKFAST Food Group and Servings / Carb Servings Grain/Starches __________________________________ Dairy __________________________________________ Vegetable ______________________________________ Fruit ___________________________________________ Meat __________________________________________ Fat ____________________________________________ LUNCH Food Group and Servings / Carb Servings Grain/Starches  ___________________________________ Dairy ___________________________________________ Fruit ____________________________________________ Meat ___________________________________________ Fat _____________________________________________ Laural Golden Food Group and Servings / Carb Servings Grain/Starches ___________________________________ Dairy ___________________________________________ Fruit ____________________________________________ Meat ___________________________________________ Fat _____________________________________________ SNACKS Food Group and Servings / Carb Servings Grain/Starches ___________________________________ Dairy ___________________________________________ Vegetable _______________________________________ Fruit ____________________________________________ Meat ___________________________________________ Fat _____________________________________________ DAILY TOTALS Starches _________________________ Vegetable ________________________ Fruit ____________________________ Dairy ____________________________ Meat ____________________________ Fat ______________________________ Document Released: 11/10/2004 Document Revised: 05/08/2011 Document Reviewed: 09/21/2008 [employer] Patient Information 2014 [employer].

## 2012-10-15 NOTE — Assessment & Plan Note (Addendum)
Lab Results  Component Value Date   HGBA1C 9.3 10/15/2012   HGBA1C 10.0 01/03/2012   HGBA1C 10.3 05/25/2011     Assessment: A1C improved to 9.3% today. Pt reports medication compliance with Lantus 100 units qhs, Humalog sliding scale. She did not bring her medicines or glucometer to clinic today but states that she checks her blood sugars at home which run in 300s-400s except for morning lows in the double digits, 42 this morning.  She has tried self-adjusting her large Lantus dose but states that with 50 or even 70 units Lantus at night, her morning blood sugars are in 300s.  Decreased sensation on foot exam today. Pt needs to see [name] Plyler today to further discuss her insulin regimen.  Diabetes control: poor control (HgbA1C >9%) Progress toward A1C goal:  improved  Plan: Pt met with Norm Parcel who suggested decreasing her Lantus dose to 68 units qhs as this should be her maximum daily Lantus dose.  Lipid panel today. Will follow-up in one month, encouraged pt to bring her medicines and her glucometer to next appointment.  Medications:  continue current medications Home glucose monitoring: Frequency: 3 times a day Timing: before meals Instruction/counseling given: reminded to bring blood glucose meter & log to each visit Educational resources provided: brochure Self management tools provided: home glucose logbook Other plans: Pt has chronic blurry vision that she says is gradually worsening, she has trouble seeing at night now.  She sees ophthalmologist and told her cataracts could be removed but her insurance will not pay for surgery at this time. Pt given information on diabetic diet meal planning.

## 2012-10-15 NOTE — Assessment & Plan Note (Signed)
Pt states she falls on a daily basis. She thinks falls may be due to her knee and foot pain as well as peripheral neuropathy.  She does endorse hitting her head and LOC, most recent episode 3 weeks ago.  Denies pre-syncopal symptoms and states she only loses consciousness if she hits her head.  She has never gone to the ED for these falls/LOC.   -will refer to neuro rehab in future, pt wishes to hold off until she gets her diarrhea under better control

## 2012-10-15 NOTE — Progress Notes (Signed)
I saw and evaluated the patient.  I personally confirmed the key portions of the history and exam documented by Dr. Aundria Rud and I reviewed pertinent patient test results.  The assessment, diagnosis, and plan were formulated together and I agree with the documentation in the resident's note. I did not get the impression that the pt was truly have LOC with the falls. If she falls and hits her head hard enough then she will get "stars" in front of her eyes. Since this has occurred many times over many years, the etiology is highly unlikely to be life threatening.

## 2012-10-15 NOTE — Assessment & Plan Note (Signed)
See overview above- thorough workup with Dr. Juanda Chance in 2012, failed multiple medical treatments. Chronic diarrhea since 2007 after cholecystectomy.  Pt states diarrhea occuring for hours daily with occasional incontinence. She does not drink well water, no recent travel, no camping.  She has tried omitting dairy from her diet with no relief. No dizziness with standing.   -request records from Dr. Regino Schultze office -referral to GI at [facility]

## 2012-10-15 NOTE — Assessment & Plan Note (Addendum)
BP Readings from Last 3 Encounters:  10/15/12 148/77  09/16/12 188/94  02/16/12 164/108    Lab Results  Component Value Date   NA 138 01/12/2012   K 4.1 01/12/2012   CREATININE 0.69 01/12/2012    Assessment: BP only mildly elevated in clinic today.  Blood pressure control: mildly elevated Progress toward BP goal:  improved  Plan: No change to medication regimen today, will likely add losartan at next visit (pt cannot take ACEi due to cough). Pt encouraged to bring her medicines, BP log as well as her cuff to next visit.  Medications:  continue current medications Educational resources provided: brochure Self management tools provided: home blood pressure logbook

## 2012-10-15 NOTE — Progress Notes (Signed)
Diabetes Self-Management Education  Visit Number: Follow-up 1  10/16/2012 [name], identified by name and date of birth, is a 47 y.o. female with  .  Other people present during visit: young child     ASSESSMENT  Patient Concerns:   diarrhea (doctor's concern is blood sugars) patient desires blood sugar goal to be ~ 200 mg/dl- suggest goal O9G of 8% for now with less variability to assist her bowel function.   Lab Results: LDL Cholesterol  Date Value Range Status  10/15/2012 90  0 - 99 mg/dL Final           Hemoglobin A1C  Date Value Range Status  10/15/2012 9.3   Final     Family History  Problem Relation Age of Onset  . Hypertension Mother   . Kidney disease Mother   . Hypertension Father   . Breast cancer Maternal Grandmother   . Prostate cancer Maternal Grandfather   . Ovarian cancer Paternal Grandmother   . Prostate cancer Paternal Grandfather   . Colon cancer Maternal Uncle    History  Substance Use Topics  . Smoking status: Never Smoker   . Smokeless tobacco: Never Used  . Alcohol Use: No    Assessment comments:  Currently reports no problems obtaining insulin except once in a while has a lapse in insulin ( both reportedly) for several days while she waits for [facility]. Reports difficulty obtaining enough strips to check her blood sugar from the pharmacy- they sell her 50-100 at one time. Reports taking 30-35 units Humalog 3x a day with meals at  8-8 am- water of juice, egg or toast 1-2 Pm sandwich and water ~ 7 PM  She also takes 100 units lantus each night at bedtime with last dose being last night. Did not bring meter today, reports blood sugar fasting in am for the past week have been 47-42 this am-58-38, then last night her blood sugar was in 400's and other times her meter reads "I"( >600 she told me per her meter manual). today's weight is ~ 20 # increase from 09/2010 and her A1C is also 2% better.   She asks for help with  chronic diarrhea. Expresses inability to control blood sugar despite very large ( "not the text book amount") amounts of insulin. Current reported insulin dose is 2.1units/kg. Because patient reports very low hypoglycemic episodes often ( almost every morning this week) and she is using large doses of insulin that have not adequately controlled patient's blood sugar, suggest restarting at lower and reasonable dose and retitrating. Also could consider adding metformin for obvious insulin resistance although patient says this has been tried in the past.   Calculated TDD = 48 units- 114 units/day ( 0.5-1.2 units/kg) Current dose is 2.1 units/kg and diabetes uncontrolled. Suggest restarting at reasonable dose 1.2 units/kg , consider metformin slowly titrated for insulin resistance as diarrhea is obviously unrelated and the assistance of this medication in lowering blood sugars may assist bowels in more normal functioning.   Note depression screening positive for severe depression. Patient on medicine for same. Met with social work re counseling several years ago.     Individualized Plan for Diabetes Self-Management Training:  Patient individualized diabetes plan discussed today with patient and includes: what is Diabetes, Nutrition, medications, monitoring, physical activity, how to handle highs and lows, Special care for my body when I have diabetes, Dealing daily with diabetes  Education Topics Reviewed with Patient Today:  Topic  Points Discussed  Disease State    Nutrition Management    Physical Activity and Exercise    Medications Reviewed patients medication for diabetes, action, purpose, timing of dose and side effects.  Monitoring Identified appropriate SMBG and A1C goals.  Acute Complications    Chronic Complications Relationship between chronic complications and blood glucose control  Psychosocial Adjustment    Goal Setting    Preconception Care (if applicable)      PATIENTS GOALS    Learning Objective(s):     Goal The patient agrees to:  Nutrition    Physical Activity    Medications take my medication as prescribed  Monitoring test my blood glucose as discussed and bring meter to clinic- call this Friday with blood sugars  Problem Solving    Reducing Risk    Health Coping     Patient Self-Evaluation of Goals (Subsequent Visits)  Goal The patient rates self as meeting goals (% of time)  Nutrition    Physical Activity    Medications    Monitoring    Problem Solving    Reducing Risk    Health Coping      PERSONALIZED PLAN / SUPPORT  Self-Management Support:   to be discussed at next visit ___________________  Outcomes  Expected Outcomes:  Demonstrated limited interest in learning.  Expect minimal changes Self-Care Barriers:  Lack of child care;Lack of material resources Education material provided: yes If problems or questions, patient to contact team via:  Phone Time in: 1100     Time out: 1200 Future DSME appointment: - 4-6 wks   Debra [name], Debra [name] 10/16/2012 9:52 AM

## 2012-10-15 NOTE — Progress Notes (Signed)
Patient ID: Debra [name], female   DOB: 11/04/1965, 47 y.o.   MRN: [id number]   Subjective:   Patient ID: Debra [name] female   DOB: [birth year] 47 y.o.   MRN: [id number]  HPI: [name] is a 47 y.o. woman with history of uncontrolled DM1, uncontrolled hypertension, chronic joint pain (hands, knees, feet), chronic diarrhea who presents for diarrhea, falls, and DM/HTN check.   Diarrhea: Pt states that she has had chronic diarrhea since 2007 after her cholecystectomy.  She states that the diarrhea is brown and runny and occurs daily, interfering with her activities.  She is not able to work due to having to be in the bathroom for hours each day (states that once diarrhea starts, she is in bathroom for 7-12 hours).  She is incontinent to stool at times as well.  She does not drink well water, no recent travel, no camping.  She has tried omitting dairy from her diet with no relief.  Pt has seen Dr. Juanda Chance with [facility] in the past (last in 2012), had a thorough workup for possible causes of her diarrhea and failed multiple treatments.  IgA TTG, lactoferrin, C diff, stool studies in 8/12 all negative.  She had a colonoscopy in 8/12 which showed internal hemorrhoids; biopsies showed normal mucosa without evidence of colitis.  Pt states she has had EGD in past as well though not in Epic; we will request records for all of the above.  She has tried Questran, Imodium, Bentyl, Lomotil, cholestyramine, Flagyl, Prednisone without improvement.  She is not on metformin for diabetes. She drinks water "by the gallons" to keep up with GI losses and des not feel lightheaded upon standing.  She is trying to apply for disability at this time.   Falls: Pt states that she has falls almost daily.  She does not feel dizzy or diaphoretic prior to falling.  She thinks falls may be due to pain in her knees/feet and decreased sensation in her feet due to peripheral neuropathy.  She usually just bruises her knees and/or  arms with falls.  However, she hits her head during some falls and sometimes loses consciousness "depending on what she comes down on"; of note, she only ever loses consciousness if she hits her head.  Most recent LOC was 3 weeks ago.  She has never been concerned enough to seek medical attention.    Knee, hand pain: Pt sees Dr. Jennette Kettle with sports medicine, last seen in 08/2012.  Pt has gotten repeat steroid injections in knees.  She got 6 months of relief from first set of injections in 11/13 thus she had repeat injections on 7/23.  States her knees are hurting today.  Diabetes: A1C 9.3% today, improved from 10.0 in 11/13.  Pt did not bring her meter or her medicines today but reports good medication compliance with Lantus 100 units at bedtime, Humalog sliding scale.  Pt states that her blood sugars have been running in 300s-400s at home and sometimes her meter just reads "high."  She has daily morning lows, including 42 this morning.  She usually feels tremulous, diaphoretic, and nauseous when her blood sugar is low, drinks juice and eats something sweet then feels better.  Denies polydipsia, polyuria.   HTN: BP is 148/77 today, improved from 182/93 in 11/13.  Pt reports good compliance with all of her medications (amlodipine, clonidine, Imdur, spironolactone).  Pt was on ACEi in past but had side effect of cough so was discontinued by  pulmonologist Dr. Sherene Sires.  She did not bring a BP log today but checks her BP at home and states that it has been running 180s-190s/110s. Pt denies headaches, lightheadedness, nausea.   Patient denies any fever, chills, abdominal pain, chest pain, short of breath.   Past Medical History  Diagnosis Date  . Allergy     seasonal  . Heart murmur     Birth  . Hyperlipidemia     2005  . Stroke     in 1990  . Neuromuscular disorder     Neuropathy - hands/feet  . Anxiety     1990  . Anemia     2005  . Asthma     2000  . Diabetes mellitus     type i juvenille;  1988  . Hypertension     1998  . Mental disorder     Depression 1990  . Anal fissure   . Hx of adenomatous colonic polyps   . Internal hemorrhoids 04/24/06    on colonoscopy  . Migraine   . Cataract   . GERD (gastroesophageal reflux disease)   . Staphylococcus aureus infection     toe  . Family history of malignant neoplasm of gastrointestinal tract   . Anginal pain    Current Outpatient Prescriptions  Medication Sig Dispense Refill  . albuterol (PROVENTIL HFA;VENTOLIN HFA) 108 (90 BASE) MCG/ACT inhaler Inhale 1-2 puffs into the lungs every 6 (six) hours as needed for wheezing or shortness of breath.  1 Inhaler  1  . amLODipine (NORVASC) 5 MG tablet One twice daily  60 tablet  11  . aspirin EC 81 MG tablet Take 162 mg by mouth daily.      Marland Kitchen atorvastatin (LIPITOR) 10 MG tablet Take 10 mg by mouth daily.      . budesonide-formoterol (SYMBICORT) 160-4.5 MCG/ACT inhaler Inhale 2 puffs into the lungs 2 (two) times daily.  1 Inhaler  5  . cloNIDine (CATAPRES) 0.3 MG tablet One three times a day      . dicyclomine (BENTYL) 20 MG tablet Take 20 mg by mouth every 6 (six) hours. As needed      . diphenoxylate-atropine (LOMOTIL) 2.5-0.025 MG per tablet Take 1 tablet by mouth 4 (four) times daily as needed. For diarrhea.      . DULoxetine (CYMBALTA) 30 MG capsule Take 1 capsule (30 mg total) by mouth daily.  30 capsule  3  . gabapentin (NEURONTIN) 400 MG capsule Take 1 capsule (400 mg total) by mouth 3 (three) times daily.  90 capsule  5  . insulin glargine (LANTUS SOLOSTAR) 100 UNIT/ML injection Inject 1 mL (100 Units total) into the skin at bedtime.  90 mL  3  . insulin lispro (HUMALOG) 100 UNIT/ML injection Inject 0-15 Units into the skin 3 (three) times daily before meals. Per sliding scale along with 20 units.  10 mL  6  . isosorbide mononitrate (IMDUR) 30 MG 24 hr tablet Take 30 mg by mouth daily.      Marland Kitchen olmesartan-hydrochlorothiazide (BENICAR HCT) 40-25 MG per tablet Take 1 tablet by mouth  daily.  30 tablet  11  . olopatadine (PATANOL) 0.1 % ophthalmic solution Place 1 drop into both eyes 2 (two) times daily.      Marland Kitchen oxymetazoline (AFRIN) 0.05 % nasal spray Place 3 sprays into the nose 2 (two) times daily.        Marland Kitchen spironolactone (ALDACTONE) 100 MG tablet Take 1 tablet (100 mg total) by mouth daily.  90 tablet  3  . nitroGLYCERIN (NITROSTAT) 0.4 MG SL tablet Place 1 tablet (0.4 mg total) under the tongue every 5 (five) minutes as needed.  100 tablet  3   No current facility-administered medications for this visit.   Family History  Problem Relation Age of Onset  . Hypertension Mother   . Kidney disease Mother   . Hypertension Father   . Breast cancer Maternal Grandmother   . Prostate cancer Maternal Grandfather   . Ovarian cancer Paternal Grandmother   . Prostate cancer Paternal Grandfather   . Colon cancer Maternal Uncle    History   Social History  . Marital Status: Divorced    Spouse Name: N/A    Number of Children: 2  . Years of Education: 14   Occupational History  . Unemployed    Social History Main Topics  . Smoking status: Never Smoker   . Smokeless tobacco: Never Used  . Alcohol Use: No  . Drug Use: No     Comment: drug addict  . Sexual Activity: Yes    Birth Control/ Protection: IUD   Other Topics Concern  . None   Social History Narrative   Lost medicaid about 2009 ish when her youngest child turned 72. Has adult children. Lives with boyfriend who financially supports her. Has attempted to get disability but has been turned down.      2-3 caffeine drinks daily    Review of Systems: Review of Systems  Constitutional: Positive for diaphoresis. Negative for fever, chills and weight loss.  HENT: Negative for congestion and sore throat.   Eyes: Positive for blurred vision. Negative for double vision and pain.  Respiratory: Negative for cough, shortness of breath and wheezing.   Cardiovascular: Negative for chest pain, palpitations and leg  swelling.  Gastrointestinal: Positive for diarrhea. Negative for nausea, vomiting, abdominal pain, constipation and blood in stool.  Genitourinary: Negative for dysuria.  Musculoskeletal: Positive for joint pain and falls. Negative for myalgias.  Skin: Negative for rash.  Neurological: Positive for loss of consciousness. Negative for dizziness, tingling, focal weakness, seizures, weakness and headaches.    Objective:  Physical Exam: Filed Vitals:   10/15/12 0932  BP: 148/77  Pulse: 88  Temp: 97.1 F (36.2 C)  TempSrc: Oral  Height: 5\' 4"  (1.626 m)  Weight: 209 lb (94.802 kg)  SpO2: 99%   General: alert, cooperative, and in no apparent distress HEENT: vision grossly intact, oropharynx clear and non-erythematous  Neck: supple, no lymphadenopathy, JVD, or carotid bruits Lungs: clear to ascultation bilaterally, normal work of respiration, no wheezes, rales, ronchi Heart: regular rate and rhythm, no murmurs, gallops, or rubs Abdomen: soft, non-tender, non-distended, normal bowel sounds Extremities: no bruising appreciated; no cyanosis, clubbing, or edema Neurologic: alert & oriented X3, cranial nerves II-XII intact, strength grossly intact, sensation intact to light touch  Assessment & Plan:  Patient discussed with Dr. Rogelia Boga. Please see problem-based assessment and plan.

## 2012-10-16 ENCOUNTER — Encounter: Payer: Self-pay | Admitting: Internal Medicine

## 2012-10-16 DIAGNOSIS — F32A Depression, unspecified: Secondary | ICD-10-CM | POA: Insufficient documentation

## 2012-10-16 DIAGNOSIS — F329 Major depressive disorder, single episode, unspecified: Secondary | ICD-10-CM | POA: Insufficient documentation

## 2012-10-16 MED ORDER — INSULIN GLARGINE 100 UNIT/ML ~~LOC~~ SOLN: 66.0000 [IU] | Freq: Every day | SUBCUTANEOUS | Status: DC

## 2012-10-16 MED ORDER — INSULIN LISPRO 100 UNIT/ML ~~LOC~~ SOLN: 0.0000 [IU] | Freq: Three times a day (TID) | SUBCUTANEOUS | Status: DC

## 2012-10-16 NOTE — Addendum Note (Signed)
Addended by: Leanora Cover on: 10/16/2012 05:51 PM   Modules accepted: Orders

## 2012-10-22 ENCOUNTER — Telehealth: Payer: Self-pay | Admitting: *Deleted

## 2012-10-22 NOTE — Telephone Encounter (Signed)
Pt called asking for Rx for Bacid and  Lactinex.  The were recommended by [name] the diabetic educator, for pt's diarrhea. Pt # [id number]

## 2012-10-23 ENCOUNTER — Telehealth: Payer: Self-pay | Admitting: Internal Medicine

## 2012-10-24 NOTE — Telephone Encounter (Signed)
Ms. Hudman called about taking probiotics for her diarrhea as recommended by our diabetes educator.  Discussed with patient that there is no data to suggest that probiotics improve chronic diarrhea; they can also be quite expensive but they are over the counter if she would like to try them, pt declined.    Pt states that she is still having hours of diarrhea daily and "gets 2 hours if she is lucky."  She states that she is depressed as a result because she she can not work or get out much.  She reports compliance with Cymbalta for depression. On further questioning, she states that she has had thoughts of hurting herself but "would never do anything."  Instructed pt that if these thoughts return or intensify, she should seek immediate medical attention by calling the clinic and/or going to the ED.  She lives at home alone and feels safe at home (no weapons in the home).     She has an appointment at [facility] on 10/12 for further evaluation of her diarrhea and is currently seeking disability for chronic diarrhea.  We discussed calling the clinic to see if she can be placed on cancellation list to try to be seen sooner.  We also have requested records from her previous gastroenterologist, Dr. Juanda Chance.

## 2012-10-25 ENCOUNTER — Encounter: Payer: No Typology Code available for payment source | Admitting: Internal Medicine

## 2012-10-31 ENCOUNTER — Other Ambulatory Visit: Payer: Self-pay | Admitting: *Deleted

## 2012-10-31 MED ORDER — ALBUTEROL SULFATE HFA 108 (90 BASE) MCG/ACT IN AERS: 1.0000 | INHALATION_SPRAY | Freq: Four times a day (QID) | RESPIRATORY_TRACT | Status: DC | PRN

## 2012-11-01 NOTE — Telephone Encounter (Signed)
MD called pt.

## 2012-11-08 ENCOUNTER — Encounter: Payer: Self-pay | Admitting: Internal Medicine

## 2012-11-08 ENCOUNTER — Ambulatory Visit (INDEPENDENT_AMBULATORY_CARE_PROVIDER_SITE_OTHER): Payer: No Typology Code available for payment source | Admitting: Internal Medicine

## 2012-11-08 VITALS — BP 206/96 | HR 78 | Temp 98.0°F | Ht 64.5 in | Wt 210.0 lb

## 2012-11-08 DIAGNOSIS — R197 Diarrhea, unspecified: Secondary | ICD-10-CM

## 2012-11-08 DIAGNOSIS — Z9181 History of falling: Secondary | ICD-10-CM

## 2012-11-08 DIAGNOSIS — I10 Essential (primary) hypertension: Secondary | ICD-10-CM

## 2012-11-08 DIAGNOSIS — F3289 Other specified depressive episodes: Secondary | ICD-10-CM

## 2012-11-08 DIAGNOSIS — R296 Repeated falls: Secondary | ICD-10-CM

## 2012-11-08 DIAGNOSIS — E1039 Type 1 diabetes mellitus with other diabetic ophthalmic complication: Secondary | ICD-10-CM

## 2012-11-08 DIAGNOSIS — Z23 Encounter for immunization: Secondary | ICD-10-CM

## 2012-11-08 DIAGNOSIS — F329 Major depressive disorder, single episode, unspecified: Secondary | ICD-10-CM

## 2012-11-08 DIAGNOSIS — Z299 Encounter for prophylactic measures, unspecified: Secondary | ICD-10-CM

## 2012-11-08 DIAGNOSIS — K529 Noninfective gastroenteritis and colitis, unspecified: Secondary | ICD-10-CM

## 2012-11-08 DIAGNOSIS — H579 Unspecified disorder of eye and adnexa: Secondary | ICD-10-CM

## 2012-11-08 LAB — CBC
HCT: 40.6 % (ref 36.0–46.0)
MCH: 29.1 pg (ref 26.0–34.0)
MCV: 86.8 fL (ref 78.0–100.0)
Platelets: 316 10*3/uL (ref 150–400)
RBC: 4.68 MIL/uL (ref 3.87–5.11)

## 2012-11-08 LAB — BASIC METABOLIC PANEL WITH GFR
BUN: 10 mg/dL (ref 6–23)
CO2: 29 mEq/L (ref 19–32)
Calcium: 8.9 mg/dL (ref 8.4–10.5)
Creat: 0.75 mg/dL (ref 0.50–1.10)
Glucose, Bld: 234 mg/dL — ABNORMAL HIGH (ref 70–99)

## 2012-11-08 MED ORDER — CLONIDINE HCL 0.3 MG PO TABS: 0.3000 mg | ORAL_TABLET | Freq: Two times a day (BID) | ORAL | Status: DC

## 2012-11-08 MED ORDER — DIPHENOXYLATE-ATROPINE 2.5-0.025 MG PO TABS: 1.0000 | ORAL_TABLET | Freq: Four times a day (QID) | ORAL | Status: DC | PRN

## 2012-11-08 MED ORDER — INSULIN LISPRO 100 UNIT/ML ~~LOC~~ SOLN: 0.0000 [IU] | Freq: Every day | SUBCUTANEOUS | Status: DC

## 2012-11-08 MED ORDER — AMLODIPINE BESYLATE 10 MG PO TABS: 10.0000 mg | ORAL_TABLET | Freq: Every day | ORAL | Status: DC

## 2012-11-08 MED ORDER — DICYCLOMINE HCL 20 MG PO TABS: 20.0000 mg | ORAL_TABLET | Freq: Four times a day (QID) | ORAL | Status: DC | PRN

## 2012-11-08 MED ORDER — DULOXETINE HCL 30 MG PO CPEP: 60.0000 mg | ORAL_CAPSULE | Freq: Every day | ORAL | Status: DC

## 2012-11-08 NOTE — Progress Notes (Signed)
I saw and evaluated the patient.  I personally confirmed the key portions of the history and exam documented by Dr. Kriss Ishler and I reviewed pertinent patient test results.  The assessment, diagnosis, and plan were formulated together and I agree with the documentation in the resident's note. 

## 2012-11-08 NOTE — Assessment & Plan Note (Addendum)
Pt reports compliance with Lantus 66 units at night, Humalog 16 units + SSI.  She thinks that she has had more episodes of hypoglycemia since decreasing her Lantus dose last month.  Denies symptoms of hyperglycemia.   Called pharmacy, pt last filled Lantus on 08/26/12 and last filled Humalog on 06/03/12.  -continue Lantus 66 units daily -pt only eating one full meal/day and may be having falls due to periodic hypoglycemia therefore instructed pt to only use Humalog with her daily meal -pt instructed to continue checking her BGs and to bring all of her medicines and her glucometer in to next visit -follow-up with Norm Parcel in next couple of weeks

## 2012-11-08 NOTE — Patient Instructions (Addendum)
Please follow-up with Debra [name] in 2-3 weeks and [facility] resident in 1-2 months.   We are adjusting several of your medications and gave you new prescriptions for all of the ones that changed.  Specifically, you should do the following to try to simplify your medication regimen: 1. Take amlodipine 10 mg once daily (instead of 5 mg twice daily) 2. Decrease clonidine 0.3 mg to twice daily (instead of three times daily) 3. Only take Humalog with your biggest meal of the day (we decided on lunch).  4. Increase duloxetine dose to 60 mg daily (instead of 30 mg daily).   Please remember to bring all of your medicines, your glucometer, your blood pressure log, and your blood pressure cuff to your next visit.   Call [facility] at  [phone] to make a mental health appointment.   Don't forget your appointment at [facility] in October.  Remember to call and ask if you can be placed on cancellation list to be seen sooner if possible.   We are checking your blood counts and electrolytes today.  We will let you know if there are any abnormal results.   You got your flu shot today!

## 2012-11-08 NOTE — Assessment & Plan Note (Signed)
Pt continues to report falls.  No presyncopal symptoms, no recent LOC.  Possibly due to periodic hypoglycemia or hypotension (per above).   -continue decreased dose of Lantus, decrease Humalog to only daily with pt's one meal -decrease clonidine to 0.3 mg BID (instead of TID)  -may refer to neuro rehab in future once diarrhea better controlled

## 2012-11-08 NOTE — Assessment & Plan Note (Signed)
Flu shot today 

## 2012-11-08 NOTE — Assessment & Plan Note (Addendum)
Pt has appt with [facility] next month, instructed to call and ask to be added to cancellation list if she would like to be seen sooner. Pt reporting ?melena (most recent 2 days ago) as well as hematochezia (most recent 2 weeks ago) but refused rectal exam today.  When asked how she is able to make it for entire clinic appointments without using the bathroom, she states "just luck."  Also despite pt's reported decreased appetite and only eating one [place] sandwich per day, she has actually had 15 pound weight gain.   -CBC today given ?melena  -BMP today to check for electrolyte disturbances 2/2 chronic diarrhea -re-request records from Dr. Regino Schultze office.   -follow-up after appt at [facility]

## 2012-11-08 NOTE — Progress Notes (Signed)
Patient ID: Debra [name], female   DOB: June 05, 1965, 47 y.o.   MRN: [id number]   Subjective:   Patient ID: Debra [name] female   DOB: [birth year] 47 y.o.   MRN: [id number]  HPI: [name] is a 47 y.o. woman with history of uncontrolled DM1, uncontrolled hypertension, chronic joint pain (hands, knees, feet), chronic diarrhea who presents for 1 month DM/HTN check.   Diarrhea:  Pt continues to have brown or green, sometimes very dark (most recent 2 days ago), "loose" stools daily; she has also noticed some bright red blood in her stool, most recently 2 weeks ago.  She spends hours of her days in the bathroom, states that she was in the bathroom all morning today and is in the bathroom most nights so she is unable to sleep or eat.  She also reports continued "accidents" due to not being able to get to the bathroom fast enough.  When asked how she is able to make it for entire clinic appointments without using the bathroom, she states "just luck."  She has abdominal cramping during episodes of diarrhea but does not feel lightheaded when she stands from lying or sitting.  Pt states she only eats one [place] sandwich or a small salad each day at lunchtime but has not had any weight loss.  We have not received records from Dr. Regino Schultze office where she has apparently had extensive workup in the past (see note from 8/19).  We also referred her to [facility] at last visit, and she has an appt there next month.   Diabetes:  A1C 9.3% in 09/2012. Pt brought her meter in today which showed some episodes of hypoglycemia in the morning (two at 55, one at 49) but average BG 283 and average post-dinner 319.  She states that she is having more episodes of HYPOglycemia since decreasing her Lantus dose from 100 to 66 per her last visit with Norm Parcel.  She usually feels tremulous, diaphoretic, and nauseous when her blood sugar is low, drinks juice and eats something sweet then feels better.  She is also  taking Humalog 16 units + SSI with meals and does not take this if she is not eating.  Denies polydipsia, polyuria.   HTN:  BP is 214/100 today, repeat 206/96.  Pt reports good compliance with all of her medications (amlodipine, clonidine, Imdur, olmesartan-HCTZ, spironolactone). She did not bring a BP log today but checks her BP at home and states that it has been running in 200s systolic although two days ago it was 124/56. Pt has a constant headache, but denies lightheadedness, nausea.   Depression: Pt states that she continues to feel quite low.  She reports medication compliance with her Cymbalta.  On nursing assessment, pt stated that she had thoughts of wishing she was dead almost every day.  Pt denies SI/HI, stating that she "would never do anything."  She understands that if she has thoughts of hurting herself, she should immediately seek medical attention.   Falls:  Pt states that she continues to have falls. She does not feel dizzy, nauseous, or diaphoretic prior to falling. She thinks falls may be due to pain in her knees/feet and decreased sensation in her feet due to peripheral neuropathy because they happen with no warning. She usually just bruises her knees and/or arms with falls and has not had any recent LOC.   Patient denies any fever, chills, abdominal pain, chest pain, short of breath.  Past  Medical History  Diagnosis Date  . Allergy     seasonal  . Heart murmur     Birth  . Hyperlipidemia     2005  . Stroke     in 1990  . Neuromuscular disorder     Neuropathy - hands/feet  . Anxiety     1990  . Anemia     2005  . Asthma     2000  . Diabetes mellitus     type i juvenille; 1988  . Hypertension     1998  . Mental disorder     Depression 1990  . Anal fissure   . Hx of adenomatous colonic polyps   . Internal hemorrhoids 04/24/06    on colonoscopy  . Migraine   . Cataract   . GERD (gastroesophageal reflux disease)   . Staphylococcus aureus infection     toe   . Family history of malignant neoplasm of gastrointestinal tract   . Anginal pain   . Depression 10/16/2012   Current Outpatient Prescriptions  Medication Sig Dispense Refill  . albuterol (PROVENTIL HFA;VENTOLIN HFA) 108 (90 BASE) MCG/ACT inhaler Inhale 1-2 puffs into the lungs every 6 (six) hours as needed for wheezing or shortness of breath.  1 Inhaler  0  . amLODipine (NORVASC) 10 MG tablet Take 1 tablet (10 mg total) by mouth daily. Daily  30 tablet  11  . aspirin EC 81 MG tablet Take 162 mg by mouth daily.      Marland Kitchen atorvastatin (LIPITOR) 10 MG tablet Take 10 mg by mouth daily.      . cloNIDine (CATAPRES) 0.3 MG tablet Take 1 tablet (0.3 mg total) by mouth 2 (two) times daily.  60 tablet  1  . dicyclomine (BENTYL) 20 MG tablet Take 1 tablet (20 mg total) by mouth every 6 (six) hours as needed.  120 tablet  1  . diphenoxylate-atropine (LOMOTIL) 2.5-0.025 MG per tablet Take 1 tablet by mouth 4 (four) times daily as needed. For diarrhea.  30 tablet  1  . DULoxetine (CYMBALTA) 30 MG capsule Take 2 capsules (60 mg total) by mouth daily.  30 capsule  1  . gabapentin (NEURONTIN) 400 MG capsule Take 1 capsule (400 mg total) by mouth 3 (three) times daily.  90 capsule  5  . insulin glargine (LANTUS) 100 UNIT/ML injection Inject 0.66 mLs (66 Units total) into the skin at bedtime.  90 mL  3  . insulin lispro (HUMALOG) 100 UNIT/ML injection Inject 0-15 Units into the skin daily before lunch. Per sliding scale along with 16 units.  10 mL  6  . isosorbide mononitrate (IMDUR) 30 MG 24 hr tablet Take 30 mg by mouth daily.      Marland Kitchen olmesartan-hydrochlorothiazide (BENICAR HCT) 40-25 MG per tablet Take 1 tablet by mouth daily.  30 tablet  11  . olopatadine (PATANOL) 0.1 % ophthalmic solution Place 1 drop into both eyes 2 (two) times daily.      Marland Kitchen oxymetazoline (AFRIN) 0.05 % nasal spray Place 3 sprays into the nose 2 (two) times daily.        Marland Kitchen spironolactone (ALDACTONE) 100 MG tablet Take 1 tablet (100 mg total)  by mouth daily.  90 tablet  3  . budesonide-formoterol (SYMBICORT) 160-4.5 MCG/ACT inhaler Inhale 2 puffs into the lungs 2 (two) times daily.  1 Inhaler  5  . nitroGLYCERIN (NITROSTAT) 0.4 MG SL tablet Place 1 tablet (0.4 mg total) under the tongue every 5 (five) minutes as needed.  100 tablet  3   No current facility-administered medications for this visit.   Family History  Problem Relation Age of Onset  . Hypertension Mother   . Kidney disease Mother   . Hypertension Father   . Breast cancer Maternal Grandmother   . Prostate cancer Maternal Grandfather   . Ovarian cancer Paternal Grandmother   . Prostate cancer Paternal Grandfather   . Colon cancer Maternal Uncle    History   Social History  . Marital Status: Divorced    Spouse Name: N/A    Number of Children: 2  . Years of Education: 14   Occupational History  . Unemployed    Social History Main Topics  . Smoking status: Never Smoker   . Smokeless tobacco: Never Used  . Alcohol Use: No  . Drug Use: No     Comment: drug addict  . Sexual Activity: Yes    Birth Control/ Protection: IUD   Other Topics Concern  . None   Social History Narrative   Lost medicaid about 2009 ish when her youngest child turned 61. Has adult children. Lives with boyfriend who financially supports her. Has attempted to get disability but has been turned down.      2-3 caffeine drinks daily    Review of Systems: Review of Systems  Constitutional: Negative for fever, chills, weight loss, malaise/fatigue and diaphoresis.  HENT: Negative for sore throat and neck pain.   Eyes: Negative for blurred vision.  Respiratory: Negative for cough.   Cardiovascular: Negative for chest pain, palpitations and leg swelling.  Gastrointestinal: Positive for diarrhea, blood in stool and melena. Negative for heartburn, nausea, vomiting and abdominal pain.  Genitourinary: Negative for dysuria.  Musculoskeletal: Positive for joint pain and falls. Negative for  back pain.  Neurological: Positive for weakness and headaches. Negative for dizziness, focal weakness, seizures and loss of consciousness.  Psychiatric/Behavioral: Positive for depression and suicidal ideas. The patient has insomnia.     Objective:  Physical Exam: Filed Vitals:   11/08/12 1330 11/08/12 1551  BP: 214/100 206/96  Pulse: 91 78  Temp: 98 F (36.7 C)   TempSrc: Oral   Height: 5' 4.5" (1.638 m)   Weight: 210 lb (95.255 kg)   SpO2: 99%    General: alert, cooperative, and in no apparent distress HEENT: NCAT, vision grossly intact, oropharynx clear and non-erythematous  Neck: supple, no lymphadenopathy Lungs: clear to ascultation bilaterally, normal work of respiration, no wheezes, rales, ronchi Heart: mildly tachycardic, regular rate and rhythm, no murmurs, gallops, or rubs Abdomen: soft, non-tender, non-distended, normal bowel sounds Extremities: no cyanosis, clubbing, or edema Neurologic: alert & oriented X3, cranial nerves II-XII intact, strength grossly intact, sensation intact to light touch Pt refused rectal exam today.   Assessment & Plan:  Patient discussed with Dr. Doreene Adas. Please see problem-based assessment and plan.

## 2012-11-08 NOTE — Assessment & Plan Note (Addendum)
Pt continues to feel depressed about her chronic diarrhea and not being able to work or get out and do much. She reports good compliance with duloxetine but has not filled since 01/12/12 per pharmacy.  No SI/HI, pt understands that she should seek immediate medical attention if she has thoughts of hurting herself.   -increase duloxetine from 30 mg daily to 60 mg daily; will likely up titrate at next visit -pt provided with [facility]'s phone number, encouraged to make an appointment for further evaluation of her depression

## 2012-11-08 NOTE — Assessment & Plan Note (Addendum)
BP elevated to 214/100, repeat 206/96 today.  Unclear if pt is compliant with her medicines.  Called [facility] Dept where pt gets all of her medicines, spironolactone last filled on 10/14/12, amlodipine on 07/08/12, olmesartan-HCTZ on 09/13/12, Imdur on 02/13/12; clonidine has never been filled.  Pt was provided with BP log and instructed to record all BPs and bring into next visit along with her cuff.    -decrease clonidine to 0.3 mg BID (previously prescribed TID) due to side effect profile (pt already depressed) and falls possibly due to periodic hypotension -change amlodipine to 10 mg once daily (previously prescribed 0.5 mg BID) -continue olmesartan-HCTZ, spironolactone, Imdur; consider discontinuing Imdur and/or decreasing dose of spironolactone at next visit (if BP better controlled) to simplify medication regimen

## 2012-11-11 ENCOUNTER — Ambulatory Visit (INDEPENDENT_AMBULATORY_CARE_PROVIDER_SITE_OTHER): Payer: No Typology Code available for payment source | Admitting: Dietician

## 2012-11-11 DIAGNOSIS — E119 Type 2 diabetes mellitus without complications: Secondary | ICD-10-CM

## 2012-11-11 NOTE — Patient Instructions (Addendum)
Please make a follow up appointment for 1-2 weeks.  We'll follow up on blood sugars to see if splitting your Lantus to 33 units twice a day lowers dinner and bedtime readings.   Please consider writing blood sugars in logbook, this may help you see patterns and the improvement quicker.

## 2012-11-11 NOTE — Progress Notes (Signed)
Diabetes Self-Management Education  Visit Number: Follow-up 2  11/11/2012 [name], identified by name and date of birth, is a 47 y.o. female with  .  Other people present during visit:    NO  ASSESSMENT  Patient Concerns:    Patient declined weight today. CBg was 203 this am fasting.   Lab Results: LDL Cholesterol  Date Value Range Status  10/15/2012 90  0 - 99 mg/dL Final           Hemoglobin A1C  Date Value Range Status  10/15/2012 9.3   Final     Family History  Problem Relation Age of Onset  . Hypertension Mother   . Kidney disease Mother   . Hypertension Father   . Breast cancer Maternal Grandmother   . Prostate cancer Maternal Grandfather   . Ovarian cancer Paternal Grandmother   . Prostate cancer Paternal Grandfather   . Colon cancer Maternal Uncle    History  Substance Use Topics  . Smoking status: Never Smoker   . Smokeless tobacco: Never Used  . Alcohol Use: No    Support Systems:       need to investigate further Patient Belief / Attitude about Diabetes:    patient indicated lack of belief that diabetes can be controlled.   Assessment comments:   patient demonstrates inconsistency in report of dietary intake; said she eats more than 1 meal a day and thus should be taking Humalog more frequently, then in recall denied eating more than water and a [place] sandwich daily. Meter download shows 3 CBG's since Humalog changed to 1x a day: [phone] patient guessed since she did not test blood sugar before this meal and took 230 units Novolog,203.     Individualized Plan for Diabetes Self-Management Training:  Patient individualized diabetes plan discussed today with patient and includes: what is Diabetes, Nutrition, medications, monitoring, physical activity, how to handle highs and lows, Special care for my body when I have diabetes, Dealing daily with diabetes  Education Topics Reviewed with Patient Today:  Topic Points Discussed  Disease State     Nutrition Management    Physical Activity and Exercise    Medications     Monitoring    Acute Complications   patient carries juice in large container in case she has low blood sugars  Chronic Complications    Psychosocial Adjustment  patient given social workers card as she says her priority right now is keeping electricity on  Goal Setting    used motivational interviewing to assist patient with goal setting- feeling better no diarrhea vs improved blood sugars  Preconception Care (if applicable)      PATIENTS GOALS   Learning Objective(s):     Goal The patient agrees to:  Nutrition    Physical Activity    Medications  take med's is resolved  Monitoring  record blood sugar on book provided  Problem Solving    Reducing Risk    Health Coping     Patient Self-Evaluation of Goals (Subsequent Visits)  Goal The patient rates self as meeting goals (% of time)  Nutrition    Physical Activity    Medications >75%  Monitoring < 25% (running out of strips and does not have money for more)  Problem Solving    Reducing Risk    Health Coping      PERSONALIZED PLAN / SUPPORT  Self-Management Support:    ______________________________________________________________________  Outcomes  Expected Outcomes:  Demonstrated interest in learning. Expect positive  outcomes;Other (comment) Self-Care Barriers:  Lack of material resources Education material provided: yes- logbook If problems or questions, patient to contact team via:  Phone Time in: 0830     Time out: 0930 Future DSME appointment: - 2 wks   Debra [name], Debra [name] 11/11/2012 10:36 AM

## 2012-11-13 ENCOUNTER — Telehealth: Payer: Self-pay | Admitting: *Deleted

## 2012-11-13 NOTE — Telephone Encounter (Signed)
Pharm calls and states pt uses novolog not humalog, may she continue?

## 2012-11-13 NOTE — Telephone Encounter (Signed)
Yes, that should be fine since they are both rapid acting insulins.  She should continue taking what she has been.

## 2012-11-14 NOTE — Telephone Encounter (Signed)
Called to health dept

## 2012-11-25 ENCOUNTER — Ambulatory Visit: Payer: No Typology Code available for payment source | Admitting: Dietician

## 2012-11-25 ENCOUNTER — Encounter: Payer: Self-pay | Admitting: Internal Medicine

## 2012-11-27 NOTE — Progress Notes (Signed)
Opened in error

## 2012-12-09 ENCOUNTER — Encounter: Payer: Self-pay | Admitting: Internal Medicine

## 2012-12-09 ENCOUNTER — Ambulatory Visit (INDEPENDENT_AMBULATORY_CARE_PROVIDER_SITE_OTHER): Payer: No Typology Code available for payment source | Admitting: Internal Medicine

## 2012-12-09 VITALS — BP 198/99 | HR 82 | Temp 97.8°F | Ht 64.25 in | Wt 213.7 lb

## 2012-12-09 DIAGNOSIS — E1039 Type 1 diabetes mellitus with other diabetic ophthalmic complication: Secondary | ICD-10-CM

## 2012-12-09 DIAGNOSIS — I10 Essential (primary) hypertension: Secondary | ICD-10-CM

## 2012-12-09 DIAGNOSIS — H579 Unspecified disorder of eye and adnexa: Secondary | ICD-10-CM

## 2012-12-09 LAB — TSH: TSH: 1.191 u[IU]/mL (ref 0.350–4.500)

## 2012-12-09 MED ORDER — TERAZOSIN HCL 2 MG PO CAPS: 2.0000 mg | ORAL_CAPSULE | Freq: Every day | ORAL | Status: DC

## 2012-12-09 NOTE — Patient Instructions (Signed)
General Instructions: For your high blood pressure -we are adding Terazosin, a blood pressure medication.  Take 1 tablet every evening. -we are testing for other causes of high blood pressure --please go to the lab, where they will explain about a 24-hour urine collection --we will schedule you for an ultrasound of your kidneys  For your diabetes, to decrease low blood sugar episodes, decrease your dinnertime insulin to 10 units, plus your sliding scale.  Please return for a follow-up visit in 3-4 weeks.   Treatment Goals:  Goals (1 Years of Data) as of 12/09/12   None      Progress Toward Treatment Goals:  Treatment Goal 12/09/2012  Hemoglobin A1C unchanged  Blood pressure unchanged  Prevent falls unchanged    Self Care Goals & Plans:  Self Care Goal 12/09/2012  Manage my medications take my medicines as prescribed; bring my medications to every visit; refill my medications on time; follow the sick day instructions if I am sick  Monitor my health keep track of my blood glucose; bring my glucose meter and log to each visit; keep track of my blood pressure; keep track of my weight; check my feet daily  Eat healthy foods eat more vegetables; eat fruit for snacks and desserts; eat baked foods instead of fried foods; eat foods that are low in salt; eat smaller portions; drink diet soda or water instead of juice or soda  Be physically active take a walk every day; find an activity I enjoy    Home Blood Glucose Monitoring 12/09/2012  Check my blood sugar 3 times a day  When to check my blood sugar before meals     Care Management & Community Referrals:  Referral 12/09/2012  Referrals made for care management support none needed

## 2012-12-09 NOTE — Assessment & Plan Note (Signed)
Lab Results  Component Value Date   HGBA1C 9.3 10/15/2012   HGBA1C 10.0 01/03/2012   HGBA1C 10.3 05/25/2011     Assessment: Diabetes control: poor control (HgbA1C >9%) Progress toward A1C goal:  unchanged Comments: The patient continues to note frequent hypoglycemic episodes, despite decreasing lantus significantly.  Since glucometer reveals mostly late night and early morning hypoglycemia, we will decrease dinnertime insulin.  Plan: Medications:  Continue Lantus 33 units BID, continue Humalog 15 units + SSI before breakfast and lunch, decrease Humalog to 10 units + SSI before dinner. Home glucose monitoring: Frequency: 3 times a day Timing: before meals Instruction/counseling given: reminded to bring blood glucose meter & log to each visit Educational resources provided: brochure;handout Self management tools provided: copy of home glucose meter download Other plans: Recheck at next visit.

## 2012-12-09 NOTE — Assessment & Plan Note (Addendum)
BP Readings from Last 3 Encounters:  12/09/12 198/99  11/08/12 206/96  10/15/12 148/77    Lab Results  Component Value Date   NA 134* 11/08/2012   K 4.0 11/08/2012   CREATININE 0.75 11/08/2012    Assessment: Blood pressure control: severely elevated Progress toward BP goal:  unchanged Comments: BP remains significantly elevated, as it has been in the past.  The patient notes extensive prior work-up for secondary HTN.  Per chart review, one test I don't see is 24-hour urine metanephrine and cortisol levels.  Plan: Medications:  continue current medications.  Will add terazosin at 2 mg.  As we uptitrate terzosin, we can downtitrate clonidine. Educational resources provided: Comptroller tools provided:   Other plans: Check 24-hr urine studies.   Addendum: Received call from patient that terazosin wasn't covered under her MAP program.  She asks if there is anything on the [facility] list that she could take instead, as she has no income.  As such, we will d/c terazosin, and start Coreg

## 2012-12-09 NOTE — Progress Notes (Signed)
HPI The patient is a 47 y.o. female with a history of chronic diarrhea, DM, HTN, depression, presenting for a 1 month follow-up.  The patient's BP is significantly elevated, similar to her prior visit, and at many visits for the last few years.  She has only taken Norvasc this morning, due to sedation with clonidine which interferes with driving, but typically notes compliance with medications.  She brings all of her BP medications with her to our visit today, though they are all in outdated bottles, which the patient explains is due to difficulty opening the newer bottles, causing her to put all of her pills in older bottles.  The patient has been using Lantus 66, plus Humalog 15 + SSI.  She continues to note hypoglycemia daily, with times that vary from early morning, to during the day, to late at night.  Lantus was recently decreased from 100 units to 66 units.  Glucometer report shows hypoglycemia typically occuring late at night or in the early morning.  The patient continues to notes loose stools, described as a constant stream of diarrhea.  She has an appointment at [facility] on 10/20.   ROS: General: no fevers, chills, changes in weight, changes in appetite Skin: no rash HEENT: no blurry vision, hearing changes, sore throat Pulm: no dyspnea, coughing, wheezing CV: no chest pain, palpitations, shortness of breath Abd: no abdominal pain, nausea/vomiting GU: no dysuria, hematuria, polyuria Ext: no arthralgias, myalgias Neuro: no weakness, numbness, or tingling  Filed Vitals:   12/09/12 0832  BP: 198/99  Pulse: 82  Temp: 97.8 F (36.6 C)    PEX General: alert, cooperative, and in no apparent distress HEENT: pupils equal round and reactive to light, vision grossly intact, oropharynx clear and non-erythematous  Neck: supple Lungs: clear to ascultation bilaterally, normal work of respiration, no wheezes, rales, ronchi Heart: regular rate and rhythm, no murmurs, gallops, or  rubs Abdomen: soft, non-tender, non-distended, normal bowel sounds Extremities: no cyanosis, clubbing, or edema Neurologic: alert & oriented X3, cranial nerves II-XII intact, strength grossly intact, sensation intact to light touch  Current Outpatient Prescriptions on File Prior to Visit  Medication Sig Dispense Refill  . albuterol (PROVENTIL HFA;VENTOLIN HFA) 108 (90 BASE) MCG/ACT inhaler Inhale 1-2 puffs into the lungs every 6 (six) hours as needed for wheezing or shortness of breath.  1 Inhaler  0  . amLODipine (NORVASC) 10 MG tablet Take 1 tablet (10 mg total) by mouth daily. Daily  30 tablet  11  . aspirin EC 81 MG tablet Take 162 mg by mouth daily.      Marland Kitchen atorvastatin (LIPITOR) 10 MG tablet Take 10 mg by mouth daily.      . budesonide-formoterol (SYMBICORT) 160-4.5 MCG/ACT inhaler Inhale 2 puffs into the lungs 2 (two) times daily.  1 Inhaler  5  . cloNIDine (CATAPRES) 0.3 MG tablet Take 1 tablet (0.3 mg total) by mouth 2 (two) times daily.  60 tablet  1  . dicyclomine (BENTYL) 20 MG tablet Take 1 tablet (20 mg total) by mouth every 6 (six) hours as needed.  120 tablet  1  . diphenoxylate-atropine (LOMOTIL) 2.5-0.025 MG per tablet Take 1 tablet by mouth 4 (four) times daily as needed. For diarrhea.  30 tablet  1  . DULoxetine (CYMBALTA) 30 MG capsule Take 2 capsules (60 mg total) by mouth daily.  30 capsule  1  . gabapentin (NEURONTIN) 400 MG capsule Take 1 capsule (400 mg total) by mouth 3 (three) times daily.  90 capsule  5  . insulin glargine (LANTUS) 100 UNIT/ML injection Inject 0.66 mLs (66 Units total) into the skin at bedtime.  90 mL  3  . insulin lispro (HUMALOG) 100 UNIT/ML injection Inject 0-15 Units into the skin daily before lunch. Per sliding scale along with 16 units.  10 mL  6  . isosorbide mononitrate (IMDUR) 30 MG 24 hr tablet Take 30 mg by mouth daily.      . nitroGLYCERIN (NITROSTAT) 0.4 MG SL tablet Place 1 tablet (0.4 mg total) under the tongue every 5 (five) minutes as  needed.  100 tablet  3  . olmesartan-hydrochlorothiazide (BENICAR HCT) 40-25 MG per tablet Take 1 tablet by mouth daily.  30 tablet  11  . olopatadine (PATANOL) 0.1 % ophthalmic solution Place 1 drop into both eyes 2 (two) times daily.      Marland Kitchen oxymetazoline (AFRIN) 0.05 % nasal spray Place 3 sprays into the nose 2 (two) times daily.        Marland Kitchen spironolactone (ALDACTONE) 100 MG tablet Take 1 tablet (100 mg total) by mouth daily.  90 tablet  3   No current facility-administered medications on file prior to visit.    Assessment/Plan

## 2012-12-09 NOTE — Progress Notes (Signed)
Case discussed with Dr. Brown at the time of the visit.  We reviewed the resident's history and exam and pertinent patient test results.  I agree with the assessment, diagnosis and plan of care documented in the resident's note. 

## 2012-12-12 MED ORDER — CARVEDILOL 3.125 MG PO TABS: 3.1250 mg | ORAL_TABLET | Freq: Two times a day (BID) | ORAL | Status: DC

## 2012-12-12 NOTE — Addendum Note (Signed)
Addended by: Linward Headland on: 12/12/2012 11:35 AM   Modules accepted: Orders, Medications

## 2012-12-20 ENCOUNTER — Other Ambulatory Visit: Payer: No Typology Code available for payment source

## 2012-12-20 DIAGNOSIS — I10 Essential (primary) hypertension: Secondary | ICD-10-CM

## 2012-12-26 LAB — CATECHOLAMINES, FRACTIONATED, URINE, 24 HOUR
Norepinephrine, 24 hr Ur: 29 mcg/24 h (ref 15–100)
Total Volume - CF 24Hr U: 1900 mL

## 2012-12-27 LAB — CORTISOL, URINE, 24 HOUR
Cortisol (Ur), Free: 62.3 mcg/24 h — ABNORMAL HIGH (ref 4.0–50.0)
RESULTS RECEIVED: 1.36 g/(24.h) (ref 0.63–2.50)

## 2013-01-08 ENCOUNTER — Telehealth: Payer: Self-pay | Admitting: *Deleted

## 2013-01-08 ENCOUNTER — Other Ambulatory Visit: Payer: Self-pay | Admitting: Internal Medicine

## 2013-01-08 MED ORDER — GABAPENTIN 300 MG PO CAPS: 300.0000 mg | ORAL_CAPSULE | Freq: Three times a day (TID) | ORAL | Status: DC

## 2013-01-08 NOTE — Telephone Encounter (Signed)
[facility] is not longer able to get Neurontin 400 mg but they do have 300 mg samples in stock.  Would you consider writing a new Rx at lower dose?

## 2013-01-31 ENCOUNTER — Ambulatory Visit: Payer: Self-pay

## 2013-03-03 ENCOUNTER — Other Ambulatory Visit: Payer: Self-pay | Admitting: *Deleted

## 2013-03-03 DIAGNOSIS — I10 Essential (primary) hypertension: Secondary | ICD-10-CM

## 2013-03-03 MED ORDER — OLMESARTAN MEDOXOMIL-HCTZ 40-25 MG PO TABS: 1.0000 | ORAL_TABLET | Freq: Every day | ORAL | Status: DC

## 2013-03-03 NOTE — Telephone Encounter (Signed)
Need to ask [name] about the Novolog-Humalog change. Thanks

## 2013-03-03 NOTE — Telephone Encounter (Signed)
[facility] wants to know if they can dispense Novolog in place of Humalog.  Will it be the same directions??

## 2013-03-03 NOTE — Telephone Encounter (Signed)
rx faxed in 

## 2013-03-06 ENCOUNTER — Other Ambulatory Visit: Payer: Self-pay | Admitting: Internal Medicine

## 2013-03-06 ENCOUNTER — Other Ambulatory Visit: Payer: Self-pay | Admitting: *Deleted

## 2013-03-06 DIAGNOSIS — E1039 Type 1 diabetes mellitus with other diabetic ophthalmic complication: Secondary | ICD-10-CM

## 2013-03-06 MED ORDER — INSULIN ASPART 100 UNIT/ML FLEXPEN
20.0000 [IU] | PEN_INJECTOR | Freq: Three times a day (TID) | SUBCUTANEOUS | Status: DC
Start: 2013-03-06 — End: 2013-10-08

## 2013-03-06 NOTE — Telephone Encounter (Signed)
Done. Thanks for talking to [name] as well as double checking with the patient.

## 2013-03-06 NOTE — Telephone Encounter (Signed)
Rx faxed in.

## 2013-03-06 NOTE — Telephone Encounter (Signed)
Please change to Novolog Flex pen, the health department does hot have Humalog.  I checked with [facility] and change with these meds it okay.  This is how she takes the novolog - inject 20 units three times a day and add 0 - 15 units per sliding scale instructions depending on blood glucose. This is along with Lantus.  Sounds like a lot so I called pt and she verified this is correct.  It differs from Humalog directions.

## 2013-03-19 ENCOUNTER — Other Ambulatory Visit: Payer: Self-pay | Admitting: *Deleted

## 2013-03-19 DIAGNOSIS — I10 Essential (primary) hypertension: Secondary | ICD-10-CM

## 2013-03-19 DIAGNOSIS — E109 Type 1 diabetes mellitus without complications: Secondary | ICD-10-CM

## 2013-03-19 MED ORDER — NITROGLYCERIN 0.4 MG SL SUBL: 0.4000 mg | SUBLINGUAL_TABLET | SUBLINGUAL | Status: DC | PRN

## 2013-03-20 NOTE — Telephone Encounter (Signed)
Called to pharm 

## 2013-03-21 ENCOUNTER — Telehealth: Payer: Self-pay | Admitting: *Deleted

## 2013-03-21 ENCOUNTER — Other Ambulatory Visit: Payer: Self-pay | Admitting: Internal Medicine

## 2013-03-21 MED ORDER — GABAPENTIN 300 MG PO CAPS: 300.0000 mg | ORAL_CAPSULE | Freq: Four times a day (QID) | ORAL | Status: DC

## 2013-03-21 NOTE — Telephone Encounter (Signed)
Please change Neurontin to 300 mg 4 times a day. I sent the change to [facility]

## 2013-03-21 NOTE — Telephone Encounter (Signed)
Done! Thanks for your help!

## 2013-03-25 ENCOUNTER — Other Ambulatory Visit: Payer: Self-pay | Admitting: *Deleted

## 2013-03-26 MED ORDER — ALBUTEROL SULFATE HFA 108 (90 BASE) MCG/ACT IN AERS: 1.0000 | INHALATION_SPRAY | Freq: Four times a day (QID) | RESPIRATORY_TRACT | Status: DC | PRN

## 2013-03-26 MED ORDER — BUDESONIDE-FORMOTEROL FUMARATE 160-4.5 MCG/ACT IN AERO: 2.0000 | INHALATION_SPRAY | Freq: Two times a day (BID) | RESPIRATORY_TRACT | Status: DC

## 2013-03-26 NOTE — Telephone Encounter (Signed)
Called to pharm 

## 2013-04-01 ENCOUNTER — Encounter: Payer: Self-pay | Admitting: Internal Medicine

## 2013-04-22 ENCOUNTER — Telehealth: Payer: Self-pay | Admitting: Internal Medicine

## 2013-04-22 NOTE — Telephone Encounter (Signed)
Patient states that GI at [facility] prescribed her Welchol (colesevelam, bile acid sequestrant) 625 mg daily.  She took prescription to [facility] Department who told her they do not carry this medication.  Patient then called [facility], and they told her it would be $567.  She states she has gotten some of her medicines at [facility] before with orange card but could not find their phone number.  Provided patient with phone numbers for both [facility] and [facility].  Instructed her to call them and if neither could provide medication at an affordable price, she should call GI office back and ask for alternative.    Of note, she had tried cholestyramine after 11/2012 appointment at [facility] but states it did not help with her diarrhea.

## 2013-05-21 ENCOUNTER — Other Ambulatory Visit: Payer: Self-pay | Admitting: *Deleted

## 2013-05-21 DIAGNOSIS — F329 Major depressive disorder, single episode, unspecified: Secondary | ICD-10-CM

## 2013-05-21 DIAGNOSIS — F32A Depression, unspecified: Secondary | ICD-10-CM

## 2013-05-22 MED ORDER — DULOXETINE HCL 30 MG PO CPEP: 30.0000 mg | ORAL_CAPSULE | Freq: Every day | ORAL | Status: DC

## 2013-05-22 NOTE — Telephone Encounter (Signed)
Called to pharm 

## 2013-05-22 NOTE — Telephone Encounter (Signed)
Patient should have run out of her Cymbalta in 12/2012 if she was taking it as prescribed.  I will give her a one month supply of starting dose of 30 mg daily, but she will need to come in to clinic for evaluation in April before we can prescribe further or up-titrate dose. Thanks

## 2013-05-27 ENCOUNTER — Telehealth: Payer: Self-pay | Admitting: *Deleted

## 2013-05-27 NOTE — Telephone Encounter (Signed)
Pharmacy calls and states pt was not able to have cymbalta filled until jan 2015 due to it being on order at the health dept but she has been consistent filling since then and ran out 3/26, will call and make her appt asap

## 2013-06-09 ENCOUNTER — Ambulatory Visit (INDEPENDENT_AMBULATORY_CARE_PROVIDER_SITE_OTHER): Payer: No Typology Code available for payment source | Admitting: Internal Medicine

## 2013-06-09 ENCOUNTER — Encounter: Payer: Self-pay | Admitting: Internal Medicine

## 2013-06-09 VITALS — BP 195/96 | HR 86 | Temp 97.7°F | Ht 64.0 in | Wt 208.9 lb

## 2013-06-09 DIAGNOSIS — F3289 Other specified depressive episodes: Secondary | ICD-10-CM

## 2013-06-09 DIAGNOSIS — G43909 Migraine, unspecified, not intractable, without status migrainosus: Secondary | ICD-10-CM

## 2013-06-09 DIAGNOSIS — I10 Essential (primary) hypertension: Secondary | ICD-10-CM

## 2013-06-09 DIAGNOSIS — Z9181 History of falling: Secondary | ICD-10-CM

## 2013-06-09 DIAGNOSIS — R296 Repeated falls: Secondary | ICD-10-CM

## 2013-06-09 DIAGNOSIS — H538 Other visual disturbances: Secondary | ICD-10-CM

## 2013-06-09 DIAGNOSIS — F329 Major depressive disorder, single episode, unspecified: Secondary | ICD-10-CM

## 2013-06-09 DIAGNOSIS — R51 Headache: Secondary | ICD-10-CM

## 2013-06-09 DIAGNOSIS — F32A Depression, unspecified: Secondary | ICD-10-CM

## 2013-06-09 DIAGNOSIS — R197 Diarrhea, unspecified: Secondary | ICD-10-CM

## 2013-06-09 DIAGNOSIS — M359 Systemic involvement of connective tissue, unspecified: Secondary | ICD-10-CM

## 2013-06-09 DIAGNOSIS — E1039 Type 1 diabetes mellitus with other diabetic ophthalmic complication: Secondary | ICD-10-CM

## 2013-06-09 LAB — GLUCOSE, CAPILLARY: GLUCOSE-CAPILLARY: 242 mg/dL — AB (ref 70–99)

## 2013-06-09 LAB — COMPLETE METABOLIC PANEL WITH GFR
ALT: 45 U/L — AB (ref 0–35)
AST: 54 U/L — AB (ref 0–37)
Albumin: 3.8 g/dL (ref 3.5–5.2)
Alkaline Phosphatase: 113 U/L (ref 39–117)
BUN: 15 mg/dL (ref 6–23)
CHLORIDE: 97 meq/L (ref 96–112)
CO2: 26 mEq/L (ref 19–32)
Calcium: 8.8 mg/dL (ref 8.4–10.5)
Creat: 0.78 mg/dL (ref 0.50–1.10)
GFR, Est African American: >89 mL/min
GFR, Est Non African American: >89 mL/min
Glucose, Bld: 258 mg/dL — ABNORMAL HIGH (ref 70–99)
POTASSIUM: 4.6 meq/L (ref 3.5–5.3)
SODIUM: 132 meq/L — AB (ref 135–145)
Total Bilirubin: 0.4 mg/dL (ref 0.2–1.2)
Total Protein: 6.8 g/dL (ref 6.0–8.3)

## 2013-06-09 LAB — POCT GLYCOSYLATED HEMOGLOBIN (HGB A1C): HEMOGLOBIN A1C: 10.9

## 2013-06-09 MED ORDER — ACETAMINOPHEN 325 MG PO TABS: 650.0000 mg | ORAL_TABLET | Freq: Once | ORAL | Status: DC

## 2013-06-09 NOTE — Assessment & Plan Note (Signed)
She has chronic pain complaints today  Will increase Cymbalta to 60 mg daily  Also she can take Tylenol prn up to 3 grams daily  PCP to reassess

## 2013-06-09 NOTE — Assessment & Plan Note (Addendum)
BP Readings from Last 3 Encounters:  06/09/13 195/96  12/09/12 198/99  11/08/12 206/96    Lab Results  Component Value Date   NA 134* 11/08/2012   K 4.0 11/08/2012   CREATININE 0.75 11/08/2012    Assessment: Blood pressure control: moderately elevated Progress toward BP goal:  unable to assess Comments: [facility]man medications and she has not taken medications today   Plan: Medications:  continue current medications (Norvasc 10, Coreg 3.125 mg bid, Clonidine 0.3 mg bid, Spironlactone 100 mg)  Educational resources provided: other (see comments) Self management tools provided: other (see comments) Other plans: checked CMET today. Encouraged compliance

## 2013-06-09 NOTE — Assessment & Plan Note (Addendum)
Uncontrolled will increase Cymbalta 30 mg to 60 mg qd.this may also help pain  Reassess at f/u in 3 months

## 2013-06-09 NOTE — Assessment & Plan Note (Addendum)
She falls almost daily at home with orthostatic + today BP 183/90 (HR 77) lying, 171/94 (HR 72) sitting, 161/93 (HR 77) standing.  Falls could also be 2/2 peripheral neuropathy Will refer to PT for fall evaluation

## 2013-06-09 NOTE — Assessment & Plan Note (Signed)
H/A today given Tylenol 650 mg x 1 today

## 2013-06-09 NOTE — Patient Instructions (Addendum)
General Instructions: Please read the information below Follow up in 3 months, sooner if needed  Please bring your medicines with you each time you come.  You can take up to 3 grams or [phone] mg pills of Tylenol a day for pain We will increase your Cymbalta to 60 mg daily  Please take all medications as instructed  I will send you a letter with your lab results   Medicines may be  Eye drops  Herbal   Vitamins  Pills  Seeing these help [place] take care of you.   Treatment Goals:  Goals (1 Years of Data) as of 06/09/13   None      Progress Toward Treatment Goals:  Treatment Goal 06/09/2013  Hemoglobin A1C unable to assess  Blood pressure unable to assess  Prevent falls deteriorated    Self Care Goals & Plans:  Self Care Goal 06/09/2013  Manage my medications take my medicines as prescribed; refill my medications on time; bring my medications to every visit; follow the sick day instructions if I am sick  Monitor my health keep track of my blood glucose; bring my glucose meter and log to each visit; keep track of my blood pressure; bring my blood pressure log to each visit; keep track of my weight; check my feet daily  Eat healthy foods drink diet soda or water instead of juice or soda; eat more vegetables; eat foods that are low in salt; eat baked foods instead of fried foods; eat fruit for snacks and desserts; eat smaller portions  Be physically active find an activity I enjoy  Meeting treatment goals maintain the current self-care plan    Home Blood Glucose Monitoring 06/09/2013  Check my blood sugar 3 times a day  When to check my blood sugar before meals     Care Management & Community Referrals:  Referral 06/09/2013  Referrals made for care management support none needed  Referrals made to community resources none       Cataract A cataract is a clouding of the lens of the eye. When a lens becomes cloudy, vision is reduced based on the degree and nature of the  clouding. Many cataracts reduce vision to some degree. Some cataracts make people more near-sighted as they develop. Other cataracts increase glare. Cataracts that are ignored and become worse can sometimes look white. The white color can be seen through the pupil. CAUSES   Aging. However, cataracts may occur at any age, even in newborns.  Certain drugs.  Trauma to the eye.  Certain diseases such as diabetes.  Specific eye diseases such as chronic inflammation inside the eye or a sudden attack of a rare form of glaucoma.  Inherited or acquired medical problems. SYMPTOMS   Gradual, progressive drop in vision in the affected eye.  Severe, rapid visual loss. This most often happens when trauma is the cause. DIAGNOSIS  To detect a cataract, an eye doctor examines the lens. Cataracts are best diagnosed with an exam of the eyes with the pupils enlarged (dilated) by drops.  TREATMENT  For an early cataract, vision may improve by using different eyeglasses or stronger lighting. If that does not help your vision, surgery is the only effective treatment. A cataract needs to be surgically removed when vision loss interferes with your everyday activities, such as driving, reading, or watching TV. A cataract may also have to be removed if it prevents examination or treatment of another eye problem. Surgery removes the cloudy lens and usually replaces  it with a substitute lens (intraocular lens, IOL).  At a time when both you and your doctor agree, the cataract will be surgically removed. If you have cataracts in both eyes, only one is usually removed at a time. This allows the operated eye to heal and be out of danger from any possible problems after surgery (such as infection or poor wound healing). In rare cases, a cataract may be doing damage to your eye. In these cases, your caregiver may advise surgical removal right away. The vast majority of people who have cataract surgery have better vision  afterward. HOME CARE INSTRUCTIONS  If you are not planning surgery, you may be asked to do the following:  Use different eyeglasses.  Use stronger or brighter lighting.  Ask your eye doctor about reducing your medicine dose or changing medicines if it is thought that a medicine caused your cataract. Changing medicines does not make the cataract go away on its own.  Become familiar with your surroundings. Poor vision can lead to injury. Avoid bumping into things on the affected side. You are at a higher risk for tripping or falling.  Exercise extreme care when driving or operating machinery.  Wear sunglasses if you are sensitive to bright light or experiencing problems with glare. SEEK IMMEDIATE MEDICAL CARE IF:   You have a worsening or sudden vision loss.  You notice redness, swelling, or increasing pain in the eye.  You have a fever. Document Released: 02/13/2005 Document Revised: 05/08/2011 Document Reviewed: 10/07/2010 [facility] Patient Information 2014 [place], [place].  Fall Prevention and Home Safety Falls cause injuries and can affect all age groups. It is possible to prevent falls.  HOW TO PREVENT FALLS  Wear shoes with rubber soles that do not have an opening for your toes.  Keep the inside and outside of your house well lit.  Use night lights throughout your home.  Remove clutter from floors.  Clean up floor spills.  Remove throw rugs or fasten them to the floor with carpet tape.  Do not place electrical cords across pathways.  Put grab bars by your tub, shower, and toilet. Do not use towel bars as grab bars.  Put handrails on both sides of the stairway. Fix loose handrails.  Do not climb on stools or stepladders, if possible.  Do not wax your floors.  Repair uneven or unsafe sidewalks, walkways, or stairs.  Keep items you use a lot within reach.  Be aware of pets.  Keep emergency numbers next to the telephone.  Put smoke detectors in your home and  near bedrooms. Ask your doctor what other things you can do to prevent falls. Document Released: 12/10/2008 Document Revised: 08/15/2011 Document Reviewed: 05/16/2011 [facility] Patient Information 2014 [place], [place].  DASH Diet The DASH diet stands for "Dietary Approaches to Stop Hypertension." It is a healthy eating plan that has been shown to reduce high blood pressure (hypertension) in as little as 14 days, while also possibly providing other significant health benefits. These other health benefits include reducing the risk of breast cancer after menopause and reducing the risk of type 2 diabetes, heart disease, colon cancer, and stroke. Health benefits also include weight loss and slowing kidney failure in patients with chronic kidney disease.  DIET GUIDELINES  Limit salt (sodium). Your diet should contain less than 1500 mg of sodium daily.  Limit refined or processed carbohydrates. Your diet should include mostly whole grains. Desserts and added sugars should be used sparingly.  Include small amounts of  heart-healthy fats. These types of fats include nuts, oils, and tub margarine. Limit saturated and trans fats. These fats have been shown to be harmful in the body. CHOOSING FOODS  The following food groups are based on a 2000 calorie diet. See your Registered Dietitian for individual calorie needs. Grains and Grain Products (6 to 8 servings daily)  Eat More Often: Whole-wheat bread, brown rice, whole-grain or wheat pasta, quinoa, popcorn without added fat or salt (air popped).  Eat Less Often: White bread, white pasta, white rice, cornbread. Vegetables (4 to 5 servings daily)  Eat More Often: Fresh, frozen, and canned vegetables. Vegetables may be raw, steamed, roasted, or grilled with a minimal amount of fat.  Eat Less Often/Avoid: Creamed or fried vegetables. Vegetables in a cheese sauce. Fruit (4 to 5 servings daily)  Eat More Often: All fresh, canned (in natural juice), or  frozen fruits. Dried fruits without added sugar. One hundred percent fruit juice ( cup [237 mL] daily).  Eat Less Often: Dried fruits with added sugar. Canned fruit in light or heavy syrup. [employer], Fish, and Poultry (2 servings or less daily. One serving is 3 to 4 oz [85-114 g]).  Eat More Often: Ninety percent or leaner ground beef, tenderloin, sirloin. Round cuts of beef, chicken breast, [place] breast. All fish. Grill, bake, or broil your meat. Nothing should be fried.  Eat Less Often/Avoid: Fatty cuts of meat, [place], or chicken leg, thigh, or wing. Fried cuts of meat or fish. Dairy (2 to 3 servings)  Eat More Often: Low-fat or fat-free milk, low-fat plain or light yogurt, reduced-fat or part-skim cheese.  Eat Less Often/Avoid: Milk (whole, 2%).Whole milk yogurt. Full-fat cheeses. Nuts, Seeds, and Legumes (4 to 5 servings per week)  Eat More Often: All without added salt.  Eat Less Often/Avoid: Salted nuts and seeds, canned beans with added salt. Fats and Sweets (limited)  Eat More Often: Vegetable oils, tub margarines without trans fats, sugar-free gelatin. Mayonnaise and salad dressings.  Eat Less Often/Avoid: Coconut oils, palm oils, butter, stick margarine, cream, half and half, cookies, candy, pie. FOR MORE INFORMATION The Dash Diet Eating Plan: www.dashdiet.org Document Released: 02/02/2011 Document Revised: 05/08/2011 Document Reviewed: 02/02/2011 [facility] Patient Information 2014 [place], [place].  Hypertension As your heart beats, it forces blood through your arteries. This force is your blood pressure. If the pressure is too high, it is called hypertension (HTN) or high blood pressure. HTN is dangerous because you may have it and not know it. High blood pressure may mean that your heart has to work harder to pump blood. Your arteries may be narrow or stiff. The extra work puts you at risk for heart disease, stroke, and other problems.  Blood pressure consists of two  numbers, a higher number over a lower, 110/72, for example. It is stated as "110 over 72." The ideal is below 120 for the top number (systolic) and under 80 for the bottom (diastolic). Write down your blood pressure today. You should pay close attention to your blood pressure if you have certain conditions such as:  Heart failure.  Prior heart attack.  Diabetes  Chronic kidney disease.  Prior stroke.  Multiple risk factors for heart disease. To see if you have HTN, your blood pressure should be measured while you are seated with your arm held at the level of the heart. It should be measured at least twice. A one-time elevated blood pressure reading (especially in the Emergency Department) does not mean that you need  treatment. There may be conditions in which the blood pressure is different between your right and left arms. It is important to see your caregiver soon for a recheck. Most people have essential hypertension which means that there is not a specific cause. This type of high blood pressure may be lowered by changing lifestyle factors such as:  Stress.  Smoking.  Lack of exercise.  Excessive weight.  Drug/tobacco/alcohol use.  Eating less salt. Most people do not have symptoms from high blood pressure until it has caused damage to the body. Effective treatment can often prevent, delay or reduce that damage. TREATMENT  When a cause has been identified, treatment for high blood pressure is directed at the cause. There are a large number of medications to treat HTN. These fall into several categories, and your caregiver will help you select the medicines that are best for you. Medications may have side effects. You should review side effects with your caregiver. If your blood pressure stays high after you have made lifestyle changes or started on medicines,   Your medication(s) may need to be changed.  Other problems may need to be addressed.  Be certain you understand your  prescriptions, and know how and when to take your medicine.  Be sure to follow up with your caregiver within the time frame advised (usually within two weeks) to have your blood pressure rechecked and to review your medications.  If you are taking more than one medicine to lower your blood pressure, make sure you know how and at what times they should be taken. Taking two medicines at the same time can result in blood pressure that is too low. SEEK IMMEDIATE MEDICAL CARE IF:  You develop a severe headache, blurred or changing vision, or confusion.  You have unusual weakness or numbness, or a faint feeling.  You have severe chest or abdominal pain, vomiting, or breathing problems. MAKE SURE YOU:   Understand these instructions.  Will watch your condition.  Will get help right away if you are not doing well or get worse. Document Released: 02/13/2005 Document Revised: 05/08/2011 Document Reviewed: 10/04/2007 [facility] Patient Information 2014 [place], [place].  Type 2 Diabetes Mellitus, Adult Type 2 diabetes mellitus is a long-term (chronic) disease. In type 2 diabetes:  The pancreas does not make enough of a hormone called insulin.  The cells in the body do not respond as well to the insulin that is made.  Both of the above can happen. Normally, insulin moves sugars from food into tissue cells. This gives you energy. If you have type 2 diabetes, sugars cannot be moved into tissue cells. This causes high blood sugar (hyperglycemia).  HOME CARE  Have your hemoglobin A1c level checked twice a year. The level shows if your diabetes is under control or out of control.  Perform daily blood sugar testing as told by your doctor.  Check your ketone levels by testing your pee (urine) when you are sick and as told.  Take your diabetes or insulin medicine as told by your doctor.  Never run out of insulin.  Adjust how much insulin you give yourself based on how many carbs (carbohydrates)  you eat. Carbs are in many foods, such as fruits, vegetables, whole grains, and dairy products.  Have a healthy snack between every healthy meal. Have 3 meals and 3 snacks a day.  Lose weight if you are overweight.  Carry a medical alert card or wear your medical alert jewelry.  Carry a 15 gram  carb snack with you at all times. Examples include:  Glucose pills, 3 or 4.  Glucose gel, 15 gram tube.  Raisins, 2 tablespoons (24 grams).  Jelly beans, 6.  Animal crackers, 8.  Sugar pop, 4 ounces (120 milliliters).  Gummy treats, 9.  Notice low blood sugar (hypoglycemia) symptoms, such as:  Shaking (tremors).  Decreased ability to think clearly.  Sweating.  Increased heart rate.  Headache.  Dry mouth.  Hunger.  Crabbiness (irritability).  Being worried or tense (anxiety).  Restless sleep.  A change in speech or coordination.  Confusion.  Treat low blood sugar right away. If you are alert and can swallow, follow the 15:15 rule:  Take 15 20 grams of a rapid-acting glucose or carb. This includes glucose gel, glucose pills, or 4 ounces (120 milliliters) of fruit juice, regular pop, or low-fat milk.  Check your blood sugar level after taking the glucose.  Take 15 20 grams of more glucose if the repeat blood sugar level is still 70 mg/dL (milligrams/deciliter) or below.  Eat a meal or snack within 1 hour of the blood sugar levels going back to normal.  Notice early symptoms of high blood sugar, such as:  Being really thirsty or drinking a lot (polydipsia).  Peeing (urinating) a lot (polyuria).  Do at least 150 minutes of physical activity a week or as told.  Split the 150 minutes of activity up during the week. Do not do 150 minutes of activity in one day.  Perform exercises, such as weight lifting, at least 2 times a week or as told.  Adjust your insulin or food intake as needed if you start a new exercise or sport.  Follow your sick day plan when you are  not able to eat or drink as usual.  Avoid tobacco use.  Women who are not pregnant should drink no more than 1 drink a day. Men should drink no more than 2 drinks a day.  Only drink alcohol with food.  Ask your doctor if alcohol is safe for you.  Tell your doctor if you drink alcohol several times during the week.  See your doctor regularly.  Schedule an eye exam soon after you are diagnosed with diabetes. Schedule exams once every year.  Check your skin and feet every day. Check for cuts, bruises, redness, nail problems, bleeding, blisters, or sores. A doctor should do a foot exam once a year.  Brush your teeth and gums twice a day. Floss once a day. Visit your dentist regularly.  Share your diabetes plan with your workplace or school.  Stay up-to-date with shots that fight against diseases (immunizations).  Learn how to manage stress.  Get diabetes education and support as needed.  Ask your doctor for special help if:  You need help to maintain or improve how you to do things on your own.  You need help to maintain or improve the quality of your life.  You have foot or hand problems.  You have trouble cleaning yourself, dressing, eating, or doing physical activity. GET HELP RIGHT AWAY IF:  You have trouble breathing.  You have moderate to large ketone levels.  You are unable to eat food or drink fluids for more than 6 hours.  You feel sick to your stomach (nauseous) or throw up (vomit) for more than 6 hours.  Your blood sugar level is over 240 mg/dL.  There is a change in mental status.  You get another serious illness.  You have watery poop (  diarrhea) for more than 6 hours.  You have been sick or have had a fever for 2 or more days and are not getting better.  You have pain when you are physically active. MAKE SURE YOU:  Understand these instructions.  Will watch your condition.  Will get help right away if you are not doing well or get  worse. Document Released: 11/23/2007 Document Revised: 12/04/2012 Document Reviewed: 06/14/2012 [facility] Patient Information 2014 [place], [place].  Type 1 Diabetes Mellitus, Adult Type 1 diabetes mellitus, often simply referred to as diabetes, is a long-term (chronic) disease. It occurs when the islet cells in the pancreas that make insulin (a hormone) are destroyed and can no longer make insulin. Insulin is needed to move sugars from food into the tissue cells. The tissue cells use the sugars for energy. In people with type 1 diabetes, the sugars build up in the blood instead of going into the tissue cells. As a result, high blood sugar (hyperglycemia) develops. Without insulin, the body breaks down fat cells for the needed energy. This breakdown of fat cells produces acid chemicals (ketones), which increases the acid levels in the body. The effect of either high ketone or sugar (glucose) levels can be life-threatening.  Type 1 diabetes was also previously called juvenile diabetes. It most often occurs before the age of 61, but it can occur at any age. RISK FACTORS A person is predisposed to developing type 1 diabetes if someone in his or her family has the disease and is exposed to certain additional environmental triggers.  SYMPTOMS  Symptoms of type 1 diabetes may develop gradually over days to weeks or suddenly. The symptoms occur due to hyperglycemia. The symptoms can include:   Increased thirst (polydipsia).  Increased urination (polyuria).  Increased urination during the night (nocturia).  Weight loss. This weight loss may be rapid.  Frequent, recurring infections.  Tiredness (fatigue).  Weakness.  Vision changes, such as blurred vision.  Fruity smell to your breath.  Abdominal pain.  Nausea or vomiting. DIAGNOSIS  Type 1 diabetes is diagnosed when symptoms of diabetes are present and when blood glucose levels are increased. Your blood glucose level may be checked by one or  more of the following blood tests:  A fasting blood glucose test. You will not be allowed to eat for at least 8 hours before a blood sample is taken.  A random blood glucose test. Your blood glucose is checked at any time of the day regardless of when you ate.  A hemoglobin A1c blood glucose test. A hemoglobin A1c test provides information about blood glucose control over the previous 3 months. TREATMENT  Although type 1 diabetes cannot be prevented, it can be managed with insulin, diet, and exercise.  You will need to take insulin daily to keep blood glucose in the desired range.  You will need to match insulin dosing with exercise and healthy food choices. The treatment goal is to maintain the before-meal blood sugar (preprandial glucose) level at 70 130 mg/dL.  HOME CARE INSTRUCTIONS   Have your hemoglobin A1c level checked twice a year.  Perform daily blood glucose monitoring as directed by your caregiver.  Monitor urine ketones when you are ill and as directed by your caregiver.  Take your insulin as directed by your caregiver to maintain your blood glucose level in the desired range.  Never run out of insulin. It is needed every day.  Adjust insulin based on your intake of carbohydrates. Carbohydrates can raise blood  glucose levels but need to be included in your diet. Carbohydrates provide vitamins, minerals, and fiber, which are an essential part of a healthy diet. Carbohydrates are found in fruits, vegetables, whole grains, dairy products, legumes, and foods containing added sugars.    Eat healthy foods. Alternate 3 meals with 3 snacks.  Maintain a healthy weight.  Carry a medical alert card or wear your medical alert jewelry.  Carry a 15 gram carbohydrate snack with you at all times to treat low blood glucose (hypoglycemia). Some examples of 15 gram carbohydrate snacks include:  Glucose tablets, 3 or 4.   Glucose gel, 15 gram tube.  Raisins, 2 tablespoons (24  grams).  Jelly beans, 6.  Animal crackers, 8.  Fruit juice, regular soda, or low-fat milk, 4 ounces (120 mL).  Gummy treats, 9.    Recognize hypoglycemia. Hypoglycemia occurs with blood glucose levels of 70 mg/dL and below. The risk for hypoglycemia increases when fasting or skipping meals, during or after intense exercise, and during sleep. Hypoglycemia symptoms can include:  Tremors or shakes.  Decreased ability to concentrate.  Sweating.  Increased heart rate.  Headache.  Dry mouth.  Hunger.  Irritability.  Anxiety.  Restless sleep.  Altered speech or coordination.  Confusion.  Treat hypoglycemia promptly. If you are alert and able to safely swallow, follow the 15:15 rule:  Take 15 20 grams of rapid-acting glucose or carbohydrate. Rapid-acting options include glucose gel, glucose tablets, or 4 ounces (120 mL) of fruit juice, regular soda, or low-fat milk.  Check your blood glucose level 15 minutes after taking the glucose.   Take 15 20 grams more of glucose if the repeat blood glucose level is still 70 mg/dL or below.  Eat a meal or snack within 1 hour once blood glucose levels return to normal.  Be alert to polyuria and polydipsia, which are early signs of hyperglycemia. An early awareness of hyperglycemia allows for prompt treatment. Treat hyperglycemia as directed by your caregiver.  Engage in at least 150 minutes of moderate-intensity physical activity a week, spread over at least 3 days of the week or as directed by your caregiver.  Adjust your insulin dosing and food intake as needed if you start a new exercise or sport.  Follow your sick day plan at any time you are unable to eat or drink as usual.   Avoid tobacco use.  Limit alcohol intake to no more than 1 drink per day for nonpregnant women and 2 drinks per day for men. You should drink alcohol only when you are also eating food. Talk with your caregiver about whether alcohol is safe for you.  Tell your caregiver if you drink alcohol several times a week.  Follow up with your caregiver regularly.  Schedule an eye exam within 5 years of diagnosis and then annually.  Perform daily skin and foot care. Examine your skin and feet daily for cuts, bruises, redness, nail problems, bleeding, blisters, or sores. A foot exam by a caregiver should be done annually.  Brush your teeth and gums at least twice a day and floss at least once a day. Follow up with your dentist regularly.  Share your diabetes management plan with your workplace or school.  Stay up-to-date with immunizations.  Learn to manage stress.  Obtain ongoing diabetes education and support as needed.  Participate or seek rehabilitation as needed to maintain or improve independence and quality of life. Request a physical or occupational therapy referral if you are having foot or  hand numbness or difficulties with grooming, dressing, eating, or physical activity. SEEK MEDICAL CARE IF:   You are unable to eat food or drink fluids for more than 6 hours.  You have nausea and vomiting for more than 6 hours.  Your blood glucose level is over 240 mg/dL.  There is a change in mental status.  You develop an additional serious illness.  You have diarrhea for more than 6 hours.  You have been sick or have had a fever for a couple of days and are not getting better.  You have pain during any physical activity. SEEK IMMEDIATE MEDICAL CARE IF:  You have difficulty breathing.  You have moderate to large ketone levels. MAKE SURE YOU:  Understand these instructions.  Will watch your condition.  Will get help right away if you are not doing well or get worse. Document Released: 02/11/2000 Document Revised: 11/08/2011 Document Reviewed: 09/12/2011 [facility] Patient Information 2014 [place], [place].  Chronic Pain Chronic pain can be defined as pain that is off and on and lasts for 3 6 months or longer. Many things cause  chronic pain, which can make it difficult to make a diagnosis. There are many treatment options available for chronic pain. However, finding a treatment that works well for you may require trying various approaches until the right one is found. Many people benefit from a combination of two or more types of treatment to control their pain. SYMPTOMS  Chronic pain can occur anywhere in the body and can range from mild to very severe. Some types of chronic pain include:  Headache.  Low back pain.  Cancer pain.  Arthritis pain.  Neurogenic pain. This is pain resulting from damage to nerves. People with chronic pain may also have other symptoms such as:  Depression.  Anger.  Insomnia.  Anxiety. DIAGNOSIS  Your health care provider will help diagnose your condition over time. In many cases, the initial focus will be on excluding possible conditions that could be causing the pain. Depending on your symptoms, your health care provider may order tests to diagnose your condition. Some of these tests may include:   Blood tests.   CT scan.   MRI.   X-rays.   Ultrasounds.   Nerve conduction studies.  You may need to see a specialist.  TREATMENT  Finding treatment that works well may take time. You may be referred to a pain specialist. He or she may prescribe medicine or therapies, such as:   Mindful meditation or yoga.  Shots (injections) of numbing or pain-relieving medicines into the spine or area of pain.  Local electrical stimulation.  Acupuncture.   Massage therapy.   Aroma, color, light, or sound therapy.   Biofeedback.   Working with a physical therapist to keep from getting stiff.   Regular, gentle exercise.   Cognitive or behavioral therapy.   Group support.  Sometimes, surgery may be recommended.  HOME CARE INSTRUCTIONS   Take all medicines as directed by your health care provider.   Lessen stress in your life by relaxing and doing things  such as listening to calming music.   Exercise or be active as directed by your health care provider.   Eat a healthy diet and include things such as vegetables, fruits, fish, and lean meats in your diet.   Keep all follow-up appointments with your health care provider.   Attend a support group with others suffering from chronic pain. SEEK MEDICAL CARE IF:   Your pain gets worse.  You develop a new pain that was not there before.   You cannot tolerate medicines given to you by your health care provider.   You have new symptoms since your last visit with your health care provider.  SEEK IMMEDIATE MEDICAL CARE IF:   You feel weak.   You have decreased sensation or numbness.   You lose control of bowel or bladder function.   Your pain suddenly gets much worse.   You develop shaking.  You develop chills.  You develop confusion.  You develop chest pain.  You develop shortness of breath.  MAKE SURE YOU:  Understand these instructions.  Will watch your condition.  Will get help right away if you are not doing well or get worse. Document Released: 11/05/2001 Document Revised: 10/16/2012 Document Reviewed: 08/09/2012 [facility] Patient Information 2014 [place], [place].  Depression, Adult Depression refers to feeling sad, low, down in the dumps, blue, gloomy, or empty. In general, there are two kinds of depression: 1. Depression that we all experience from time to time because of upsetting life experiences, including the loss of a job or the ending of a relationship (normal sadness or normal grief). This kind of depression is considered normal, is short lived, and resolves within a few days to 2 weeks. (Depression experienced after the loss of a loved one is called bereavement. Bereavement often lasts longer than 2 weeks but normally gets better with time.) 2. Clinical depression, which lasts longer than normal sadness or normal grief or interferes with your  ability to function at home, at work, and in school. It also interferes with your personal relationships. It affects almost every aspect of your life. Clinical depression is an illness. Symptoms of depression also can be caused by conditions other than normal sadness and grief or clinical depression. Examples of these conditions are listed as follows:  Physical illness Some physical illnesses, including underactive thyroid gland (hypothyroidism), severe anemia, specific types of cancer, diabetes, uncontrolled seizures, heart and lung problems, strokes, and chronic pain are commonly associated with symptoms of depression.  Side effects of some prescription medicine In some people, certain types of prescription medicine can cause symptoms of depression.  Substance abuse Abuse of alcohol and illicit drugs can cause symptoms of depression. SYMPTOMS Symptoms of normal sadness and normal grief include the following:  Feeling sad or crying for short periods of time.  Not caring about anything (apathy).  Difficulty sleeping or sleeping too much.  No longer able to enjoy the things you used to enjoy.  Desire to be by oneself all the time (social isolation).  Lack of energy or motivation.  Difficulty concentrating or remembering.  Change in appetite or weight.  Restlessness or agitation. Symptoms of clinical depression include the same symptoms of normal sadness or normal grief and also the following symptoms:  Feeling sad or crying all the time.  Feelings of guilt or worthlessness.  Feelings of hopelessness or helplessness.  Thoughts of suicide or the desire to harm yourself (suicidal ideation).  Loss of touch with reality (psychotic symptoms). Seeing or hearing things that are not real (hallucinations) or having false beliefs about your life or the people around you (delusions and paranoia). DIAGNOSIS  The diagnosis of clinical depression usually is based on the severity and duration  of the symptoms. Your caregiver also will ask you questions about your medical history and substance use to find out if physical illness, use of prescription medicine, or substance abuse is causing your depression. Your caregiver also  may order blood tests. TREATMENT  Typically, normal sadness and normal grief do not require treatment. However, sometimes antidepressant medicine is prescribed for bereavement to ease the depressive symptoms until they resolve. The treatment for clinical depression depends on the severity of your symptoms but typically includes antidepressant medicine, counseling with a mental health professional, or a combination of both. Your caregiver will help to determine what treatment is best for you. Depression caused by physical illness usually goes away with appropriate medical treatment of the illness. If prescription medicine is causing depression, talk with your caregiver about stopping the medicine, decreasing the dose, or substituting another medicine. Depression caused by abuse of alcohol or illicit drugs abuse goes away with abstinence from these substances. Some adults need professional help in order to stop drinking or using drugs. SEEK IMMEDIATE CARE IF:  You have thoughts about hurting yourself or others.  You lose touch with reality (have psychotic symptoms).  You are taking medicine for depression and have a serious side effect. FOR MORE INFORMATION National Alliance on Mental Illness: www.nami.[employer]: http://www.maynard.net/ Document Released: 02/11/2000 Document Revised: 08/15/2011 Document Reviewed: 05/15/2011 [facility] Patient Information 2014 [place], [place]. Migraine Headache A migraine headache is an intense, throbbing pain on one or both sides of your head. A migraine can last for 30 minutes to several hours. CAUSES  The exact cause of a migraine headache is not always known. However, a migraine may be caused when nerves in  the brain become irritated and release chemicals that cause inflammation. This causes pain. Certain things may also trigger migraines, such as:  Alcohol.  Smoking.  Stress.  Menstruation.  Aged cheeses.  Foods or drinks that contain nitrates, glutamate, aspartame, or tyramine.  Lack of sleep.  Chocolate.  Caffeine.  Hunger.  Physical exertion.  Fatigue.  Medicines used to treat chest pain (nitroglycerine), birth control pills, estrogen, and some blood pressure medicines. SIGNS AND SYMPTOMS  Pain on one or both sides of your head.  Pulsating or throbbing pain.  Severe pain that prevents daily activities.  Pain that is aggravated by any physical activity.  Nausea, vomiting, or both.  Dizziness.  Pain with exposure to bright lights, loud noises, or activity.  General sensitivity to bright lights, loud noises, or smells. Before you get a migraine, you may get warning signs that a migraine is coming (aura). An aura may include:  Seeing flashing lights.  Seeing bright spots, halos, or zig-zag lines.  Having tunnel vision or blurred vision.  Having feelings of numbness or tingling.  Having trouble talking.  Having muscle weakness. DIAGNOSIS  A migraine headache is often diagnosed based on:  Symptoms.  Physical exam.  A CT scan or MRI of your head. These imaging tests cannot diagnose migraines, but they can help rule out other causes of headaches. TREATMENT Medicines may be given for pain and nausea. Medicines can also be given to help prevent recurrent migraines.  HOME CARE INSTRUCTIONS  Only take over-the-counter or prescription medicines for pain or discomfort as directed by your health care provider. The use of long-term narcotics is not recommended.  Lie down in a dark, quiet room when you have a migraine.  Keep a journal to find out what may trigger your migraine headaches. For example, write down:  What you eat and drink.  How much sleep  you get.  Any change to your diet or medicines.  Limit alcohol consumption.  Quit smoking if you smoke.  Get 7 9 hours  of sleep, or as recommended by your health care provider.  Limit stress.  Keep lights dim if bright lights bother you and make your migraines worse. SEEK IMMEDIATE MEDICAL CARE IF:   Your migraine becomes severe.  You have a fever.  You have a stiff neck.  You have vision loss.  You have muscular weakness or loss of muscle control.  You start losing your balance or have trouble walking.  You feel faint or pass out.  You have severe symptoms that are different from your first symptoms. MAKE SURE YOU:   Understand these instructions.  Will watch your condition.  Will get help right away if you are not doing well or get worse. Document Released: 02/13/2005 Document Revised: 12/04/2012 Document Reviewed: 10/21/2012 [facility] Patient Information 2014 [place], [place].  Headaches, Frequently Asked Questions MIGRAINE HEADACHES Q: What is migraine? What causes it? How can I treat it? A: Generally, migraine headaches begin as a dull ache. Then they develop into a constant, throbbing, and pulsating pain. You may experience pain at the temples. You may experience pain at the front or back of one or both sides of the head. The pain is usually accompanied by a combination of:  Nausea.  Vomiting.  Sensitivity to light and noise. Some people (about 15%) experience an aura (see below) before an attack. The cause of migraine is believed to be chemical reactions in the brain. Treatment for migraine may include over-the-counter or prescription medications. It may also include self-help techniques. These include relaxation training and biofeedback.  Q: What is an aura? A: About 15% of people with migraine get an "aura". This is a sign of neurological symptoms that occur before a migraine headache. You may see wavy or jagged lines, dots, or flashing lights. You might  experience tunnel vision or blind spots in one or both eyes. The aura can include visual or auditory hallucinations (something imagined). It may include disruptions in smell (such as strange odors), taste or touch. Other symptoms include:  Numbness.  A "pins and needles" sensation.  Difficulty in recalling or speaking the correct word. These neurological events may last as long as 60 minutes. These symptoms will fade as the headache begins. Q: What is a trigger? A: Certain physical or environmental factors can lead to or "trigger" a migraine. These include:  Foods.  Hormonal changes.  Weather.  Stress. It is important to remember that triggers are different for everyone. To help prevent migraine attacks, you need to figure out which triggers affect you. Keep a headache diary. This is a good way to track triggers. The diary will help you talk to your healthcare professional about your condition. Q: Does weather affect migraines? A: Bright sunshine, hot, humid conditions, and drastic changes in barometric pressure may lead to, or "trigger," a migraine attack in some people. But studies have shown that weather does not act as a trigger for everyone with migraines. Q: What is the link between migraine and hormones? A: Hormones start and regulate many of your body's functions. Hormones keep your body in balance within a constantly changing environment. The levels of hormones in your body are unbalanced at times. Examples are during menstruation, pregnancy, or menopause. That can lead to a migraine attack. In fact, about three quarters of all women with migraine report that their attacks are related to the menstrual cycle.  Q: Is there an increased risk of stroke for migraine sufferers? A: The likelihood of a migraine attack causing a stroke is very  remote. That is not to say that migraine sufferers cannot have a stroke associated with their migraines. In persons under age 61, the most common  associated factor for stroke is migraine headache. But over the course of a person's normal life span, the occurrence of migraine headache may actually be associated with a reduced risk of dying from cerebrovascular disease due to stroke.  Q: What are acute medications for migraine? A: Acute medications are used to treat the pain of the headache after it has started. Examples over-the-counter medications, NSAIDs, ergots, and triptans.  Q: What are the triptans? A: Triptans are the newest class of abortive medications. They are specifically targeted to treat migraine. Triptans are vasoconstrictors. They moderate some chemical reactions in the brain. The triptans work on receptors in your brain. Triptans help to restore the balance of a neurotransmitter called serotonin. Fluctuations in levels of serotonin are thought to be a main cause of migraine.  Q: Are over-the-counter medications for migraine effective? A: Over-the-counter, or "OTC," medications may be effective in relieving mild to moderate pain and associated symptoms of migraine. But you should see your caregiver before beginning any treatment regimen for migraine.  Q: What are preventive medications for migraine? A: Preventive medications for migraine are sometimes referred to as "prophylactic" treatments. They are used to reduce the frequency, severity, and length of migraine attacks. Examples of preventive medications include antiepileptic medications, antidepressants, beta-blockers, calcium channel blockers, and NSAIDs (nonsteroidal anti-inflammatory drugs). Q: Why are anticonvulsants used to treat migraine? A: During the past few years, there has been an increased interest in antiepileptic drugs for the prevention of migraine. They are sometimes referred to as "anticonvulsants". Both epilepsy and migraine may be caused by similar reactions in the brain.  Q: Why are antidepressants used to treat migraine? A: Antidepressants are typically used  to treat people with depression. They may reduce migraine frequency by regulating chemical levels, such as serotonin, in the brain.  Q: What alternative therapies are used to treat migraine? A: The term "alternative therapies" is often used to describe treatments considered outside the scope of conventional Western medicine. Examples of alternative therapy include acupuncture, acupressure, and yoga. Another common alternative treatment is herbal therapy. Some herbs are believed to relieve headache pain. Always discuss alternative therapies with your caregiver before proceeding. Some herbal products contain arsenic and other toxins. TENSION HEADACHES Q: What is a tension-type headache? What causes it? How can I treat it? A: Tension-type headaches occur randomly. They are often the result of temporary stress, anxiety, fatigue, or anger. Symptoms include soreness in your temples, a tightening band-like sensation around your head (a "vice-like" ache). Symptoms can also include a pulling feeling, pressure sensations, and contracting head and neck muscles. The headache begins in your forehead, temples, or the back of your head and neck. Treatment for tension-type headache may include over-the-counter or prescription medications. Treatment may also include self-help techniques such as relaxation training and biofeedback. CLUSTER HEADACHES Q: What is a cluster headache? What causes it? How can I treat it? A: Cluster headache gets its name because the attacks come in groups. The pain arrives with little, if any, warning. It is usually on one side of the head. A tearing or bloodshot eye and a runny nose on the same side of the headache may also accompany the pain. Cluster headaches are believed to be caused by chemical reactions in the brain. They have been described as the most severe and intense of any headache type. Treatment for  cluster headache includes prescription medication and oxygen. SINUS HEADACHES Q:  What is a sinus headache? What causes it? How can I treat it? A: When a cavity in the bones of the face and skull (a sinus) becomes inflamed, the inflammation will cause localized pain. This condition is usually the result of an allergic reaction, a tumor, or an infection. If your headache is caused by a sinus blockage, such as an infection, you will probably have a fever. An x-ray will confirm a sinus blockage. Your caregiver's treatment might include antibiotics for the infection, as well as antihistamines or decongestants.  REBOUND HEADACHES Q: What is a rebound headache? What causes it? How can I treat it? A: A pattern of taking acute headache medications too often can lead to a condition known as "rebound headache." A pattern of taking too much headache medication includes taking it more than 2 days per week or in excessive amounts. That means more than the label or a caregiver advises. With rebound headaches, your medications not only stop relieving pain, they actually begin to cause headaches. Doctors treat rebound headache by tapering the medication that is being overused. Sometimes your caregiver will gradually substitute a different type of treatment or medication. Stopping may be a challenge. Regularly overusing a medication increases the potential for serious side effects. Consult a caregiver if you regularly use headache medications more than 2 days per week or more than the label advises. ADDITIONAL QUESTIONS AND ANSWERS Q: What is biofeedback? A: Biofeedback is a self-help treatment. Biofeedback uses special equipment to monitor your body's involuntary physical responses. Biofeedback monitors:  Breathing.  Pulse.  Heart rate.  Temperature.  Muscle tension.  Brain activity. Biofeedback helps you refine and perfect your relaxation exercises. You learn to control the physical responses that are related to stress. Once the technique has been mastered, you do not need the equipment any  more. Q: Are headaches hereditary? A: Four out of five (80%) of people that suffer report a family history of migraine. Scientists are not sure if this is genetic or a family predisposition. Despite the uncertainty, a child has a 50% chance of having migraine if one parent suffers. The child has a 75% chance if both parents suffer.  Q: Can children get headaches? A: By the time they reach high school, most young people have experienced some type of headache. Many safe and effective approaches or medications can prevent a headache from occurring or stop it after it has begun.  Q: What type of doctor should I see to diagnose and treat my headache? A: Start with your primary caregiver. Discuss his or her experience and approach to headaches. Discuss methods of classification, diagnosis, and treatment. Your caregiver may decide to recommend you to a headache specialist, depending upon your symptoms or other physical conditions. Having diabetes, allergies, etc., may require a more comprehensive and inclusive approach to your headache. The National Headache Foundation will provide, upon request, a list of [facility] physician members in your state. Document Released: 05/06/2003 Document Revised: 05/08/2011 Document Reviewed: 10/14/2007 [facility] Patient Information 2014 [place], [place]. Orthostatic Hypotension Orthostatic hypotension is a sudden fall in blood pressure. It occurs when a person goes from a sitting or lying position to a standing position. CAUSES   Loss of body fluids (dehydration).  Medicines that lower blood pressure.  Sudden changes in posture, such as sudden standing when you have been sitting or lying down.  Taking too much of your medicine. SYMPTOMS   Lightheadedness or dizziness.  Fainting or near-fainting.  A fast heart rate (tachycardia).  Weakness.  Feeling tired (fatigue). DIAGNOSIS  Your caregiver may find the cause of orthostatic hypotension through:  A history and/or  physical exam.  Checking your blood pressure. Your caregiver will check your blood pressure when you are:  Lying down.  Sitting.  Standing.  Tilt table testing. In this test, you are placed on a table that goes from a lying position to a standing position. You will be strapped to the table. This test helps to monitor your blood pressure and heart rate when you are in different positions. TREATMENT   If orthostatic hypotension is caused by your medicines, your caregiver will need to adjust your dosage. Do not stop or adjust your medicine on your own.  When changing positions, make these changes slowly. This allows your body to adjust to the different position.  Compression stockings that are worn on your lower legs may be helpful.  Your caregiver may have you consume extra salt. Do not add extra salt to your diet unless directed by your caregiver.  Eat frequent, small meals. Avoid sudden standing after eating.  Avoid hot showers or excessive heat.  Your caregiver may give you fluids through the vein (intravenous).  Your caregiver may put you on medicine to help enhance fluid retention. SEEK IMMEDIATE MEDICAL CARE IF:   You faint or have a near-fainting episode. Call your local emergency services (911 in [place]  You have or develop chest pain.  You feel sick to your stomach (nauseous) or vomit.  You have a loss of feeling or movement in your arms or legs.  You have difficulty talking, slurred speech, or you are unable to talk.  You have difficulty thinking or have confused thinking. MAKE SURE YOU:   Understand these instructions.  Will watch your condition.  Will get help right away if you are not doing well or get worse. Document Released: 02/03/2002 Document Revised: 05/08/2011 Document Reviewed: 05/29/2008 [facility] Patient Information 2014 [place], [place].

## 2013-06-09 NOTE — Assessment & Plan Note (Signed)
Lab Results  Component Value Date   HGBA1C 10.9 06/09/2013   HGBA1C 9.3 10/15/2012   HGBA1C 10.0 01/03/2012     Assessment: Diabetes control: fair control (pending repeat HA1C ) Progress toward A1C goal:  unable to assess Comments: deteriorated likely 2/2 med N/C  Plan: Medications:  continue current medications (Lantus 33 units bid), Humalog ? How pt is using it she states 15 units at least tid and at night  Home glucose monitoring: Frequency: 3 times a day Timing: before meals Instruction/counseling given: reminded to bring blood glucose meter & log to each visit, reminded to bring medications to each visit, discussed diet and provided printed educational material Educational resources provided: other (see comments) Self management tools provided: other (see comments) Other plans: rec pt meet with [name] so we can see how she is managing her DM but she declines today.  Advised to f/u in 1 week. Based on cbgs at that time titrate insulin accordingly.  Encouraged medication compliance.  HA1C checked today and referred to Opthalmology for new glasses

## 2013-06-09 NOTE — Assessment & Plan Note (Signed)
She has cataracts needs surgery  Referred to social work to try to give options of surgery coverage

## 2013-06-09 NOTE — Progress Notes (Signed)
Subjective:    Patient ID: Debra [name], female    DOB: Jul 09, 1965, 48 y.o.   MRN: [id number]  HPI Comments: 48 y.o past medical history seasonal allergies/asthma, heart murmur, hyperlipidemia, stroke, neuropathy, anxiety, anemia, uncontrolled diabetes mellitus 1 (since age 76), hypertension (BP 195/96 today) , depression, anal fissure, adenomatous colonic polyps, internal hemorrhoids, migraine, cataract, GERD (gastroesophageal reflux disease), Staphylococcus aureus infection, h/o anginal pain, chronic diarrhea x years (follows at [facility]).   She presents for f/u: 1) chronic pain all over 8/10 (knees, h/a today with h/o migraines, shoulders/arms, hands, back, legs, feet). Pain feels achy and gets sharp pains all over.  She does not feel like doing anything and cant do anything.  She has not taken any medications except Aleve 2 pills per day. Of note on 12/2011 imaging she has mild knee arthritis noted on imaging. She does not do ADLS or IADLs a lot b/c of chronic pain.   2) She needs medication refills but is unsure of what medications 3) She has fallen everyday b/c she is losing her balance.  She is normally not dizzy/lightheaded but was dizzy Friday.  Denies tripping over objects. Orthostatics checked today and + lying 183/90 (HR77), sitting 171/94 (HR 72), standing 161/93 (HR 77).  Falls are not assoc with hypoglycemia.   4) She plans to have cataract surgery in the near future (Dr. Dione Booze). Wants to know how she will pay for it 5) She has f/u for her diabetes 1 (dx'ed age 25 y.o) as well (9.3 09/2012 then today HA1C 10.9 with cbg 242). She has been hyperglycemic in the 200s- high 500s.  She a breakfast low of 69 on 05/21/13. She reports she had some lows not recorded on the meter where she was sweaty, shaking, palpitations, increased respirations.  When sx's noted she eats sugar and sx's resolve.  She states she was previously on an insulin pump and U-500 and her cbgs still were no under control. She  is taking Lantus 33 u bid and Humalog 15 units and possibly more based on cbg readings.  She does not wish to speak to Gavin Pound today She is checking her cbgs bid   6) she has chronic depression with thoughts of hurting herself all the time but w/o the plan 7)HTN BP elevated today 195/96.  She did not take any BP meds today.  She is taking Norvasc 10, Coreg 3.125 mg bid, Clonidine 0.3 mg bid, and Spironolactone 100 mg daily. 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  She just saw opthalmology Friday prior to this visit.       Review of Systems  Constitutional: Negative for fever, chills and diaphoresis.  Eyes:       Blurred vision   Respiratory: Negative for shortness of breath.   Cardiovascular: Negative for chest pain and leg swelling.  Gastrointestinal: Positive for diarrhea.       Chronic diarrhea   Genitourinary: Negative for dysuria and difficulty urinating.  Musculoskeletal: Positive for arthralgias.  Neurological: Positive for headaches. Negative for dizziness and light-headedness.  Psychiatric/Behavioral: Positive for dysphoric mood.       SI w/o plan       Objective:   Physical Exam  Nursing note and vitals reviewed. Constitutional: She is oriented to person, place, and time. She  appears well-developed and well-nourished. She is cooperative. No distress.  HENT:  Head: Normocephalic and atraumatic.  Mouth/Throat: Oropharynx is clear and moist and mucous membranes are normal. She has dentures. No oropharyngeal exudate.  Eyes: Conjunctivae are normal. Pupils are equal, round, and reactive to light. Right eye exhibits no discharge. Left eye exhibits no discharge. No scleral icterus.  Cardiovascular: Normal rate, regular rhythm, S1 normal, S2 normal and normal heart sounds.   No murmur heard. Trace lower ext edema     Pulmonary/Chest: Effort normal and breath sounds normal. No respiratory distress. She has no wheezes.  Abdominal: Soft. Bowel sounds are normal. There is tenderness.  Mild ttp LUQ, RUQ  Neurological: She is alert and oriented to person, place, and time. She has normal strength. Gait normal.  CN 2-12 grossly neurologically intact   Skin: Skin is warm and dry. No rash noted. She is not diaphoretic.  Psychiatric: Her speech is normal and behavior is normal. Judgment and thought content normal. Cognition and memory are normal. She exhibits a depressed mood.  Flat affect           Assessment & Plan:  F/u in 1 week for DM, HTN

## 2013-06-10 ENCOUNTER — Ambulatory Visit: Payer: No Typology Code available for payment source | Attending: Internal Medicine | Admitting: Physical Therapy

## 2013-06-10 ENCOUNTER — Telehealth: Payer: Self-pay | Admitting: Licensed Clinical Social Worker

## 2013-06-10 ENCOUNTER — Encounter: Payer: Self-pay | Admitting: Internal Medicine

## 2013-06-10 DIAGNOSIS — IMO0001 Reserved for inherently not codable concepts without codable children: Secondary | ICD-10-CM | POA: Insufficient documentation

## 2013-06-10 DIAGNOSIS — M6281 Muscle weakness (generalized): Secondary | ICD-10-CM | POA: Insufficient documentation

## 2013-06-10 DIAGNOSIS — R269 Unspecified abnormalities of gait and mobility: Secondary | ICD-10-CM | POA: Insufficient documentation

## 2013-06-10 NOTE — Telephone Encounter (Signed)
Mr. Denke was referred to CSW as pt is in need of cataract surgery and is currently uninsured.  CSW placed called to pt.  CSW left message requesting return call. CSW provided contact hours and phone number.  CSW will refer [name] to [facility].

## 2013-06-11 NOTE — Progress Notes (Signed)
Case discussed with Dr. McLean at the time of the visit.  We reviewed the resident's history and exam and pertinent patient test results.  I agree with the assessment, diagnosis, and plan of care documented in the resident's note.     

## 2013-06-12 ENCOUNTER — Ambulatory Visit: Payer: No Typology Code available for payment source | Admitting: Physical Therapy

## 2013-06-17 ENCOUNTER — Ambulatory Visit: Payer: No Typology Code available for payment source | Admitting: Physical Therapy

## 2013-06-17 NOTE — Telephone Encounter (Signed)
CSW placed called to pt.  CSW left message requesting return call. CSW provided contact hours and phone number. 

## 2013-06-18 NOTE — Telephone Encounter (Signed)
CSW placed called to pt.  CSW left message requesting return call. CSW provided contact hours and phone number.  Letter mailed with information.  CSW will sign off.

## 2013-06-19 NOTE — Telephone Encounter (Signed)
No return response from Ms. Rodin.  Letter mailed.

## 2013-06-20 ENCOUNTER — Ambulatory Visit: Payer: No Typology Code available for payment source | Admitting: Physical Therapy

## 2013-06-24 ENCOUNTER — Ambulatory Visit: Payer: No Typology Code available for payment source | Admitting: Physical Therapy

## 2013-06-27 ENCOUNTER — Ambulatory Visit: Payer: No Typology Code available for payment source | Attending: Internal Medicine | Admitting: Physical Therapy

## 2013-06-27 DIAGNOSIS — M6281 Muscle weakness (generalized): Secondary | ICD-10-CM | POA: Insufficient documentation

## 2013-06-27 DIAGNOSIS — IMO0001 Reserved for inherently not codable concepts without codable children: Secondary | ICD-10-CM | POA: Insufficient documentation

## 2013-06-27 DIAGNOSIS — R269 Unspecified abnormalities of gait and mobility: Secondary | ICD-10-CM | POA: Insufficient documentation

## 2013-06-30 ENCOUNTER — Ambulatory Visit: Payer: No Typology Code available for payment source | Admitting: Physical Therapy

## 2013-07-02 ENCOUNTER — Ambulatory Visit: Payer: No Typology Code available for payment source | Admitting: Physical Therapy

## 2013-07-07 ENCOUNTER — Ambulatory Visit: Payer: No Typology Code available for payment source | Admitting: Physical Therapy

## 2013-07-09 ENCOUNTER — Ambulatory Visit: Payer: No Typology Code available for payment source | Admitting: Rehabilitative and Restorative Service Providers"

## 2013-07-15 ENCOUNTER — Other Ambulatory Visit: Payer: Self-pay | Admitting: *Deleted

## 2013-07-15 ENCOUNTER — Ambulatory Visit: Payer: No Typology Code available for payment source

## 2013-07-15 NOTE — Telephone Encounter (Signed)
Opened in error. Stanton Kidney Arfa Lamarca RN 07/15/13 4:55PM

## 2013-07-16 ENCOUNTER — Other Ambulatory Visit: Payer: Self-pay | Admitting: *Deleted

## 2013-07-16 DIAGNOSIS — E1039 Type 1 diabetes mellitus with other diabetic ophthalmic complication: Secondary | ICD-10-CM

## 2013-07-17 ENCOUNTER — Ambulatory Visit: Payer: No Typology Code available for payment source | Admitting: Physical Therapy

## 2013-07-18 MED ORDER — INSULIN GLARGINE 100 UNIT/ML ~~LOC~~ SOLN: 33.0000 [IU] | Freq: Two times a day (BID) | SUBCUTANEOUS | Status: DC

## 2013-07-18 NOTE — Telephone Encounter (Signed)
Please phone in. Thanks!

## 2013-07-18 NOTE — Telephone Encounter (Signed)
Called to pharm 

## 2013-07-22 ENCOUNTER — Ambulatory Visit (INDEPENDENT_AMBULATORY_CARE_PROVIDER_SITE_OTHER): Payer: No Typology Code available for payment source | Admitting: Internal Medicine

## 2013-07-22 ENCOUNTER — Encounter: Payer: Self-pay | Admitting: Internal Medicine

## 2013-07-22 ENCOUNTER — Ambulatory Visit: Payer: No Typology Code available for payment source | Admitting: Physical Therapy

## 2013-07-22 ENCOUNTER — Ambulatory Visit (HOSPITAL_COMMUNITY)
Admission: RE | Admit: 2013-07-22 | Discharge: 2013-07-22 | Disposition: A | Discharge status: Home / Self Care | Payer: No Typology Code available for payment source | Source: Ambulatory Visit | Attending: Internal Medicine | Admitting: Internal Medicine

## 2013-07-22 VITALS — BP 184/99 | HR 85 | Temp 98.7°F | Ht 64.5 in | Wt 207.0 lb

## 2013-07-22 DIAGNOSIS — I10 Essential (primary) hypertension: Secondary | ICD-10-CM

## 2013-07-22 DIAGNOSIS — Z9181 History of falling: Secondary | ICD-10-CM

## 2013-07-22 DIAGNOSIS — M25579 Pain in unspecified ankle and joints of unspecified foot: Secondary | ICD-10-CM | POA: Insufficient documentation

## 2013-07-22 DIAGNOSIS — M25569 Pain in unspecified knee: Secondary | ICD-10-CM | POA: Insufficient documentation

## 2013-07-22 DIAGNOSIS — R296 Repeated falls: Secondary | ICD-10-CM

## 2013-07-22 DIAGNOSIS — E1039 Type 1 diabetes mellitus with other diabetic ophthalmic complication: Secondary | ICD-10-CM

## 2013-07-22 MED ORDER — HYDROCODONE-ACETAMINOPHEN 5-325 MG PO TABS: 1.0000 | ORAL_TABLET | Freq: Four times a day (QID) | ORAL | Status: DC | PRN

## 2013-07-22 MED ORDER — LOSARTAN POTASSIUM 50 MG PO TABS: 50.0000 mg | ORAL_TABLET | Freq: Every day | ORAL | Status: DC

## 2013-07-22 NOTE — Assessment & Plan Note (Addendum)
The patient continues to note frequent falls, described as "leaning over" to one side while walking and having to catch herself.  Most recently, she injured her left knee and ankle.  She has been evaluated in the past by [facility], though symptoms appear to be worsening.  Differential includes movement disorder vs deconditioning vs autonomic neuropathy.  Unlikely vestibulo-cochlear (no dizziness) vs hypoglycemia (pt denies symptoms at the time of falls). -ordered x-ray left knee and ankle, given acute pain -short-term prescription of vicodin for pain -referral to Neurology for assistance with this unclear diagnosis  Addendum 07/23/13: Knee and ankle x-rays show no fracture, but do show soft tissue swelling.  Pt unable to fill vicodin due to cost.  Requests Tramadol to be sent to [facility] outpatient pharmacy.  Prescription sent.

## 2013-07-22 NOTE — Assessment & Plan Note (Signed)
Lab Results  Component Value Date   HGBA1C 10.9 06/09/2013   HGBA1C 9.3 10/15/2012   HGBA1C 10.0 01/03/2012     Assessment: Diabetes control: poor control (HgbA1C >9%) Progress toward A1C goal:  unchanged Comments: The patient continues to have hypoglycemia (late night, early morning), and hyperglycemia (worse in afternoon/evening).  We will try to standardize her humalog scale, decrease her dinner insulin, and increase her breakfast and lunch insulin  Plan: Medications:  Continue Lantus 33 BID.  Change Humalog to: Breakfast - 20 units + correction factor 1:40 for CBG's > 100.  Lunch - 20 units + CF 1:40.  Dinner: 15 units only. Home glucose monitoring: Frequency: 4 times a day Timing: before meals Instruction/counseling given: reminded to bring blood glucose meter & log to each visit Educational resources provided: brochure;handout Self management tools provided: copy of home glucose meter download Other plans: Recheck at next visit

## 2013-07-22 NOTE — Assessment & Plan Note (Signed)
BP Readings from Last 3 Encounters:  07/22/13 184/99  06/09/13 195/96  12/09/12 198/99    Lab Results  Component Value Date   NA 132* 06/09/2013   K 4.6 06/09/2013   CREATININE 0.78 06/09/2013    Assessment: Blood pressure control: severely elevated Progress toward BP goal:  unchanged Comments: BP poorly-controlled, as it has been on prior visits.  Pt non-compliant with Benicar due to diarrhea.  Pt notes cough with 2 different ACE-I's in the past.  Plan: Medications:  D/c benicar.  Start Losartan ($8 [employer] coupon) 50 mg daily.  Continue clonidine 0.3 mg BID, amlodipine 10, coreg 3.125 BID, imdur 30, spironolactone 100 mg daily Educational resources provided: brochure;handout;web site link Self management tools provided:   Other plans: Check BMET at next visit

## 2013-07-22 NOTE — Progress Notes (Signed)
HPI The patient is a 48 y.o. female with a history of DM1, HTN, chronic knee pain, presenting for an acute visit after a fall.  The patient notes a fall 2 days ago, in which the patient was walking forward, starting leaning to the left, and fell, hitting her buttocks and subsequently hit her head.  No LOC.  No dizziness, lightheadedness, palpitations, hypoglycemia, focal weakness/numbness.  The patient has a history of multiple falls chronically, though worse in the last couple of months, for which she is currently going to PT.  The patient previously saw [facility] for this issue (2010), and notes that they reportedly diagnosed her as having a "delayed response time".  The patient notes persistent "sharp" left knee and ankle pain since the fall.  The patient has a history of DM.  She still notes significant hyperglycemia, as well as episodes of hypoglycemia "most days", typically in the early morning or late at night.  She is currently Humalog (15 units plus a correction factor [which the patient doesn't remember], which totals a maximum of 30), as well as Lantus 33 units twice per day (typically around 1pm and 11pm).  She notes eating only small meals, particularly around dinnertime.  The patient's BP is significantly elevated, as it has been on several prior visits.  The patient notes that she has not been taking benicar, due to side effects of diarrhea.  She has tried ACE-I's in the past, but these were stopped on 2 occasions due to cough, per pt report.  ROS: General: no fevers, chills, changes in weight, changes in appetite Skin: no rash HEENT: no blurry vision, hearing changes, sore throat Pulm: no dyspnea, coughing, wheezing CV: no chest pain, palpitations, shortness of breath Abd: no abdominal pain, nausea/vomiting, diarrhea/constipation GU: no dysuria, hematuria, polyuria Ext: see HPI Neuro: no weakness, numbness, or tingling  Filed Vitals:   07/22/13 1503  BP: 184/99  Pulse:  85  Temp: 98.7 F (37.1 C)    PEX General: alert, cooperative, and in no apparent distress HEENT: pupils equal round and reactive to light, vision grossly intact, oropharynx clear and non-erythematous  Neck: supple Lungs: clear to ascultation bilaterally, normal work of respiration, no wheezes, rales, ronchi Heart: regular rate and rhythm, no murmurs, gallops, or rubs Abdomen: soft, non-tender, non-distended, normal bowel sounds Extremities: left knee with diffuse ttp, maximally at tibial tuberosity, though also at medial and lateral joint lines, with anterior drawer, posterior drawer, valgus, and varus stress tests all somewhat painful but none convincingly positive.  Left ankle with maximal ttp at both medial and lateral malleolus, with no obvious joint instability noted on exam Neurologic: alert & oriented X3, cranial nerves II-XII intact, strength grossly intact, sensation intact to light touch, finger-to-nose test positive  Current Outpatient Prescriptions on File Prior to Visit  Medication Sig Dispense Refill  . albuterol (PROVENTIL HFA;VENTOLIN HFA) 108 (90 BASE) MCG/ACT inhaler Inhale 1-2 puffs into the lungs every 6 (six) hours as needed for wheezing or shortness of breath.  3 Inhaler  1  . amLODipine (NORVASC) 10 MG tablet Take 1 tablet (10 mg total) by mouth daily. Daily  30 tablet  11  . aspirin EC 81 MG tablet Take 325 mg by mouth daily.       Marland Kitchen atorvastatin (LIPITOR) 10 MG tablet Take 10 mg by mouth daily.      . carvedilol (COREG) 3.125 MG tablet Take 1 tablet (3.125 mg total) by mouth 2 (two) times daily.  60 tablet  11  . cloNIDine (CATAPRES) 0.3 MG tablet Take 1 tablet (0.3 mg total) by mouth 2 (two) times daily.  60 tablet  1  . dicyclomine (BENTYL) 20 MG tablet Take 1 tablet (20 mg total) by mouth every 6 (six) hours as needed.  120 tablet  1  . diphenoxylate-atropine (LOMOTIL) 2.5-0.025 MG per tablet Take 1 tablet by mouth 4 (four) times daily as needed. For diarrhea.  30  tablet  1  . DULoxetine (CYMBALTA) 30 MG capsule Take 1 capsule (30 mg total) by mouth daily.  30 capsule  0  . gabapentin (NEURONTIN) 300 MG capsule Take 1 capsule (300 mg total) by mouth 4 (four) times daily.  90 capsule  3  . insulin aspart (NOVOLOG FLEXPEN) 100 UNIT/ML FlexPen Inject 20 Units into the skin 3 (three) times daily with meals.  15 mL  11  . insulin glargine (LANTUS) 100 UNIT/ML injection Inject 0.33 mLs (33 Units total) into the skin 2 (two) times daily.  45 mL  1  . isosorbide mononitrate (IMDUR) 30 MG 24 hr tablet Take 30 mg by mouth daily.      . nitroGLYCERIN (NITROSTAT) 0.4 MG SL tablet Place 1 tablet (0.4 mg total) under the tongue every 5 (five) minutes as needed.  100 tablet  3  . olmesartan-hydrochlorothiazide (BENICAR HCT) 40-25 MG per tablet Take 1 tablet by mouth daily.  30 tablet  11  . olopatadine (PATANOL) 0.1 % ophthalmic solution Place 1 drop into both eyes 2 (two) times daily.      Marland Kitchen oxymetazoline (AFRIN) 0.05 % nasal spray Place 3 sprays into the nose 2 (two) times daily.        Marland Kitchen spironolactone (ALDACTONE) 100 MG tablet Take 1 tablet (100 mg total) by mouth daily.  90 tablet  3   No current facility-administered medications on file prior to visit.    Assessment/Plan

## 2013-07-22 NOTE — Patient Instructions (Signed)
General Instructions: For your falls: -we are checking an x-ray of your left knee and ankle -we are referring you to Neurology for further evaluation  For your high blood pressure: -STOP taking Benicar -we are starting you on a medication called Losartan, 1 tablet once per day  For your diabetes: -continue taking your Lantus, 33 units twice per day -For your Humalog: --Breakfast: Take 20 units, plus a correction factor of 1:40 --Lunch: Take 20 units, plus a correction factor of 1:40 --Dinner: Take 15 units only (no correction factor)  To use a correction factor, check your blood sugar, then subtract 100, and divide this number by your correction factor.  For example, if you check your blood sugar before lunch and it is 500, then 500 - 100 = 400, and 400 / 40 = 10, so you would inject 30 units (20 + 10).  Please return for a follow-up visit in 3-4 weeks.  Please bring your medicines with you each time you come to clinic.  Medicines may include prescription medications, over-the-counter medications, herbal remedies, eye drops, vitamins, or other pills.   Progress Toward Treatment Goals:  Treatment Goal 06/09/2013  Hemoglobin A1C unable to assess  Blood pressure unable to assess  Prevent falls deteriorated    Self Care Goals & Plans:  Self Care Goal 07/22/2013  Manage my medications take my medicines as prescribed; bring my medications to every visit; refill my medications on time; follow the sick day instructions if I am sick  Monitor my health bring my glucose meter and log to each visit; keep track of my blood glucose; keep track of my blood pressure; keep track of my weight; check my feet daily  Eat healthy foods eat more vegetables; eat fruit for snacks and desserts; eat foods that are low in salt; eat baked foods instead of fried foods; drink diet soda or water instead of juice or soda  Be physically active find an activity I enjoy  Meeting treatment goals -    Home Blood  Glucose Monitoring 06/09/2013  Check my blood sugar 3 times a day  When to check my blood sugar before meals     Care Management & Community Referrals:  Referral 06/09/2013  Referrals made for care management support none needed  Referrals made to community resources none

## 2013-07-23 MED ORDER — TRAMADOL HCL 50 MG PO TABS: 50.0000 mg | ORAL_TABLET | Freq: Four times a day (QID) | ORAL | Status: DC | PRN

## 2013-07-23 NOTE — Addendum Note (Signed)
Addended by: Linward Headland on: 07/23/2013 11:46 AM   Modules accepted: Orders

## 2013-07-24 ENCOUNTER — Ambulatory Visit: Payer: No Typology Code available for payment source | Admitting: Physical Therapy

## 2013-07-26 NOTE — Progress Notes (Signed)
Case discussed with Dr. Brown at time of visit.  We reviewed the resident's history and exam and pertinent patient test results.  I agree with the assessment, diagnosis, and plan of care documented in the resident's note. 

## 2013-07-29 ENCOUNTER — Ambulatory Visit: Payer: No Typology Code available for payment source | Admitting: Physical Therapy

## 2013-07-31 ENCOUNTER — Ambulatory Visit: Payer: No Typology Code available for payment source | Admitting: Physical Therapy

## 2013-08-05 ENCOUNTER — Ambulatory Visit (INDEPENDENT_AMBULATORY_CARE_PROVIDER_SITE_OTHER): Payer: Self-pay | Admitting: Internal Medicine

## 2013-08-05 ENCOUNTER — Ambulatory Visit: Payer: No Typology Code available for payment source | Admitting: Physical Therapy

## 2013-08-05 ENCOUNTER — Encounter: Payer: Self-pay | Admitting: Internal Medicine

## 2013-08-05 VITALS — BP 200/108 | HR 82 | Temp 97.4°F | Ht 64.5 in | Wt 209.1 lb

## 2013-08-05 DIAGNOSIS — Z9181 History of falling: Secondary | ICD-10-CM

## 2013-08-05 DIAGNOSIS — I10 Essential (primary) hypertension: Secondary | ICD-10-CM

## 2013-08-05 DIAGNOSIS — E1039 Type 1 diabetes mellitus with other diabetic ophthalmic complication: Secondary | ICD-10-CM

## 2013-08-05 DIAGNOSIS — R296 Repeated falls: Secondary | ICD-10-CM

## 2013-08-05 LAB — GLUCOSE, CAPILLARY: Glucose-Capillary: 285 mg/dL — ABNORMAL HIGH (ref 70–99)

## 2013-08-05 MED ORDER — LOSARTAN POTASSIUM 100 MG PO TABS: 100.0000 mg | ORAL_TABLET | Freq: Every day | ORAL | Status: DC

## 2013-08-05 MED ORDER — SPIRONOLACTONE 25 MG PO TABS: 25.0000 mg | ORAL_TABLET | Freq: Every day | ORAL | Status: DC

## 2013-08-05 NOTE — Patient Instructions (Signed)
General Instructions:  1. Please schedule a follow up appointment for 1 week.  2. Please take all medications as prescribed. Increase Losartan to 100 mg DAILY.  Start taking Spironolactone 25 mg DAILY.   Schedule an appointment w/ Norm Parcel during your next [facility] clinic visit.   Check your CBG's 4 times daily until your next visit.   3. If you have worsening of your symptoms or new symptoms arise, please call the clinic ([phone]), or go to the ER immediately if symptoms are severe.   Please bring your medicines with you each time you come to clinic.  Medicines may include prescription medications, over-the-counter medications, herbal remedies, eye drops, vitamins, or other pills.   Progress Toward Treatment Goals:  Treatment Goal 08/05/2013  Hemoglobin A1C deteriorated  Blood pressure deteriorated  Prevent falls at goal    Self Care Goals & Plans:  Self Care Goal 08/05/2013  Manage my medications take my medicines as prescribed; bring my medications to every visit; refill my medications on time  Monitor my health keep track of my blood glucose; bring my glucose meter and log to each visit; keep track of my blood pressure; bring my blood pressure log to each visit  Eat healthy foods drink diet soda or water instead of juice or soda; eat more vegetables; eat baked foods instead of fried foods; eat smaller portions  Be physically active find an activity I enjoy; take a walk every day  Prevent falls wear appropriate shoes  Meeting treatment goals maintain the current self-care plan    Home Blood Glucose Monitoring 08/05/2013  Check my blood sugar 3 times a day  When to check my blood sugar before breakfast; before dinner; at bedtime     Care Management & Community Referrals:  Referral 08/05/2013  Referrals made for care management support diabetes educator  Referrals made to community resources none

## 2013-08-05 NOTE — Progress Notes (Signed)
Subjective:   Patient ID: Debra [name] female   DOB: 1965/05/24 48 y.o.   MRN: [id number]  HPI: [name] is a 48 y.o. female w/ PMHx of HTN, DM type I (diagnosed 52~46 y/o), Asthma, depression, and h/o frequent falls, presents to the clinic today for a follow-up visit. Patient was last seen in the clinic on 07/22/13 after a fall at home. Patient sustained injuries to her L knee and ankle. XR of knee and ankle were obtained at her last visit, showed soft tissue swelling but no evidence of fracture. She still complains of knee and ankle pain, but claims it was been moderately well controlled w/ ice, elevation, and Tramadol.  Patient also has difficulty w/ controlling her BP. Patient takes Clonidine 0.3 mg bid, Amlodipine 10 mg qd, Coreg 3.125 mg bid, Imdur 30 mg qd, and Losartan 50 mg qd at home. Patient had previously been taking Benicar but was changed to Losartan during her last visit d/t adverse effect of diarrhea. She had previously been taking Spironolactone 100 mg po qd, but according to the patient, this prescription had been stopped about a year ago, however, per chart review, she should still be taking this med. Today, the patient has significantly elevated BP of 200/108. She claims she is very compliant w/ her medications and takes them religiously every morning and evening. She has also previously been worked up for secondary HTN, all the studies of which had proved to be negative. Given these findings, I continue to question her medication compliance. Patient also has uncontrolled DM type I, most recent HbA1c of 10.9 on 06/09/13. She is currently using Lantus 33 units bid in addition to Humalog 20 units in AM, 20 units at lunch (both w/ additional correction factor of 1:40 for CBG > 100), and 15 units at dinner. Patient claims she has been very compliant w/ her Insulin and checks her CBG's at home which show very large fluctuations in blood sugars at various times in the day including  mid-afternoon, early morning, and late evening. The patient was told to stop using correction factor in the evening at her last visit, however, she says her sugars are so high sometimes she feels like she still has to do this.  The patient complains of headache, blurry vision, mild lightheadedness pretty consistently, in addition to her left knee and ankle pain, as well as instability.   Past Medical History  Diagnosis Date  . Allergy     seasonal  . Heart murmur     Birth  . Hyperlipidemia     2005  . Stroke     in 1990  . Neuromuscular disorder     Neuropathy - hands/feet  . Anxiety     1990  . Anemia     2005  . Asthma     2000  . Diabetes mellitus     type i juvenille; 1988  . Hypertension     1998  . Mental disorder     Depression 1990  . Anal fissure   . Hx of adenomatous colonic polyps   . Internal hemorrhoids 04/24/06    on colonoscopy  . Migraine   . Cataract   . GERD (gastroesophageal reflux disease)   . Staphylococcus aureus infection     toe  . Family history of malignant neoplasm of gastrointestinal tract   . Anginal pain   . Depression 10/16/2012   Current Outpatient Prescriptions  Medication Sig Dispense Refill  . albuterol (  PROVENTIL HFA;VENTOLIN HFA) 108 (90 BASE) MCG/ACT inhaler Inhale 1-2 puffs into the lungs every 6 (six) hours as needed for wheezing or shortness of breath.  3 Inhaler  1  . amLODipine (NORVASC) 10 MG tablet Take 1 tablet (10 mg total) by mouth daily. Daily  30 tablet  11  . aspirin EC 81 MG tablet Take 325 mg by mouth daily.       Marland Kitchen atorvastatin (LIPITOR) 10 MG tablet Take 10 mg by mouth daily.      . carvedilol (COREG) 3.125 MG tablet Take 1 tablet (3.125 mg total) by mouth 2 (two) times daily.  60 tablet  11  . cloNIDine (CATAPRES) 0.3 MG tablet Take 1 tablet (0.3 mg total) by mouth 2 (two) times daily.  60 tablet  1  . dicyclomine (BENTYL) 20 MG tablet Take 1 tablet (20 mg total) by mouth every 6 (six) hours as needed.  120 tablet   1  . diphenoxylate-atropine (LOMOTIL) 2.5-0.025 MG per tablet Take 1 tablet by mouth 4 (four) times daily as needed. For diarrhea.  30 tablet  1  . DULoxetine (CYMBALTA) 30 MG capsule Take 1 capsule (30 mg total) by mouth daily.  30 capsule  0  . gabapentin (NEURONTIN) 300 MG capsule Take 1 capsule (300 mg total) by mouth 4 (four) times daily.  90 capsule  3  . HYDROcodone-acetaminophen (NORCO/VICODIN) 5-325 MG per tablet Take 1 tablet by mouth every 6 (six) hours as needed for moderate pain.  30 tablet  0  . insulin aspart (NOVOLOG FLEXPEN) 100 UNIT/ML FlexPen Inject 20 Units into the skin 3 (three) times daily with meals.  15 mL  11  . insulin glargine (LANTUS) 100 UNIT/ML injection Inject 0.33 mLs (33 Units total) into the skin 2 (two) times daily.  45 mL  1  . isosorbide mononitrate (IMDUR) 30 MG 24 hr tablet Take 30 mg by mouth daily.      Marland Kitchen losartan (COZAAR) 50 MG tablet Take 1 tablet (50 mg total) by mouth daily.  30 tablet  4  . nitroGLYCERIN (NITROSTAT) 0.4 MG SL tablet Place 1 tablet (0.4 mg total) under the tongue every 5 (five) minutes as needed.  100 tablet  3  . olopatadine (PATANOL) 0.1 % ophthalmic solution Place 1 drop into both eyes 2 (two) times daily.      Marland Kitchen oxymetazoline (AFRIN) 0.05 % nasal spray Place 3 sprays into the nose 2 (two) times daily.        Marland Kitchen spironolactone (ALDACTONE) 100 MG tablet Take 1 tablet (100 mg total) by mouth daily.  90 tablet  3  . traMADol (ULTRAM) 50 MG tablet Take 1-2 tablets (50-100 mg total) by mouth every 6 (six) hours as needed.  120 tablet  1   No current facility-administered medications for this visit.   Family History  Problem Relation Age of Onset  . Hypertension Mother   . Kidney disease Mother   . Hypertension Father   . Breast cancer Maternal Grandmother   . Prostate cancer Maternal Grandfather   . Ovarian cancer Paternal Grandmother   . Prostate cancer Paternal Grandfather   . Colon cancer Maternal Uncle    History   Social  History  . Marital Status: Divorced    Spouse Name: N/A    Number of Children: 2  . Years of Education: 14   Occupational History  . Unemployed    Social History Main Topics  . Smoking status: Never Smoker   .  Smokeless tobacco: Never Used  . Alcohol Use: No  . Drug Use: No     Comment: drug addict  . Sexual Activity: Yes    Birth Control/ Protection: IUD   Other Topics Concern  . None   Social History Narrative   Lost medicaid about 2009 ish when her youngest child turned 36. Has adult children. Lives with boyfriend who financially supports her. Has attempted to get disability but has been turned down.      2-3 caffeine drinks daily     Review of Systems: General: Positive for fatigue. Denies fever, chills, diaphoresis, appetite change.  Respiratory: Denies SOB, DOE, cough, chest tightness, and wheezing.   Cardiovascular: Denies chest pain and palpitations.  Gastrointestinal: Positive for diarrhea. Denies nausea, vomiting, abdominal pain, constipation, blood in stool and abdominal distention.  Genitourinary: Denies dysuria, urgency, frequency, hematuria, and flank pain. Endocrine: Positive for polyuria. Denies hot or cold intolerance. Musculoskeletal: Positive for arthralgias. Denies myalgias, back pain, joint swelling, and gait problem.  Skin: Denies pallor, rash and wounds.  Neurological: Positive for dizziness, and lightheadedness. Denies seizures, syncope, weakness, numbness and headaches.  Psychiatric/Behavioral: Denies mood changes, confusion, nervousness, sleep disturbance and agitation.   Objective:   Physical Exam: Filed Vitals:   08/05/13 0959  BP: 200/108  Pulse: 82  Temp: 97.4 F (36.3 C)  TempSrc: Oral  Height: 5' 4.5" (1.638 m)  Weight: 209 lb 1.6 oz (94.847 kg)  SpO2: 98%  General: Vital signs reviewed.  Patient is a well-developed and well-nourished, in no acute distress and cooperative with exam.  Head: Normocephalic and atraumatic. Eyes:  PERRL, EOMI, conjunctivae normal, No scleral icterus.  Neck: Supple, trachea midline, normal ROM, No JVD, masses, thyromegaly, or carotid bruit present.  Cardiovascular: RRR, S1 normal, S2 normal, no murmurs, gallops, or rubs. Pulmonary/Chest: Air entry equal bilaterally, no wheezes, rales, or rhonchi. Abdominal: Soft, non-tender, non-distended, BS +, no masses, organomegaly, or guarding present.  Musculoskeletal: No joint deformities, erythema, or stiffness, ROM full. Mild TTP over left knee joint. Extremities: +1 LE pitting edema, pulses symmetric and intact bilaterally. No cyanosis or clubbing. Neurological: A&O x3, Strength is normal and symmetric bilaterally, cranial nerve II-XII are grossly intact, no focal motor deficit, sensory intact to light touch bilaterally.  Skin: Warm, dry and intact. No rashes or erythema. Psychiatric: Depressed mood, flat affect. Speech and behavior is normal. Cognition and memory are normal.   Assessment & Plan:   Please see problem based assessment and plan.

## 2013-08-06 ENCOUNTER — Encounter: Payer: Self-pay | Admitting: Internal Medicine

## 2013-08-06 NOTE — Assessment & Plan Note (Signed)
BP Readings from Last 3 Encounters:  08/05/13 200/108  07/22/13 184/99  06/09/13 195/96    Lab Results  Component Value Date   NA 132* 06/09/2013   K 4.6 06/09/2013   CREATININE 0.78 06/09/2013    Assessment: Blood pressure control: severely elevated Progress toward BP goal:  deteriorated Comments: Patient takes multiple medications and claims to be compliant w/ all of them. Previously worked up for secondary HTN which was found to be negative. Compliance highly questionable.   Plan: Medications:  continue current medications; Clonidine 0.3 mg bid, Coreg 3.125 mg bid, Norvasc 10 mg qd, Imdur 30 mg qd.  -Increase Losartan to 100 mg qd (previously 50 mg daily).  -Patient also claims she has not been taking Spironolactone 100 mg qd for almost a year now, however, per chart review, she is still supposed to be taking this. Will add back Spironolactone 25 mg qd for now.  Educational resources provided:   Self management tools provided: home blood pressure logbook Other plans: Medication adjustments as above.  -Attempted to re-check BMP today, however, patient left clinic w/out having this done.

## 2013-08-06 NOTE — Assessment & Plan Note (Signed)
Lab Results  Component Value Date   HGBA1C 10.9 06/09/2013   HGBA1C 9.3 10/15/2012   HGBA1C 10.0 01/03/2012     Assessment: Diabetes control: poor control (HgbA1C >9%) Progress toward A1C goal:  deteriorated Comments: Patient shows glucometer readings from various times in the day w/ significant highs (all times of the day) as well as several lows, also at varying times in the day. Discussed meal planning, carb counting, etc., it seems that patient tries to eat very similar things for each meal, however, it is unclear what she REALLY does. Patient also claims she is very compliant w/ her insulin at specific times, but given her significant alterations, question compliance as well.   Plan: Medications:  continue current medications; Lantus 33 units bid + Humalog 20 units w/ breakfast + lunch (w/ 1:40 correction factor for CBG >100), and 15 units at dinner w/ NO correction factor. However, patient claims her blood sugar can be high in the evening still and she feels she has to use her correction factor.  -May be worth adding low dose Metformin in the future as patient may benefit from this if she truly has some insulin resistance as well. Patient has complaints of chronic GI symptoms, mostly diarrhea, however, this may be simply 2/2 elevated CBG's and osmotic effects 2/2 this.  Home glucose monitoring: Frequency: 3 times a day Timing: before breakfast;before dinner;at bedtime Instruction/counseling given: reminded to bring blood glucose meter & log to each visit Educational resources provided:   Self management tools provided: copy of home glucose meter download Other plans: Patient to return in 2-4 weeks and see [name] Plyler when she returns to discuss further about diet, carb counting, correction factor, etc.  -Will also attempt to arrange for an endocrinology referral for the patient.

## 2013-08-06 NOTE — Assessment & Plan Note (Signed)
Patient still claims to have frequent falls, as recently as the day previous. She denies any symptoms of presyncope; she denies dizziness, lightheadedness, or LOC which would preclude her fall, however, she is unable to give the full details her her falling events. Seems more likely to be associated w/ instability more than anything. Still complaining of left knee pain, however, no obvious swelling or erythema present. TTP over knee joint.  -Patient scheduled to follow up w/ neurology at [facility] for this, some time later in the month.

## 2013-08-07 ENCOUNTER — Ambulatory Visit: Payer: Self-pay

## 2013-08-07 ENCOUNTER — Ambulatory Visit: Payer: Self-pay | Admitting: Physical Therapy

## 2013-08-07 NOTE — Progress Notes (Signed)
Case discussed with Dr. Jones at the time of the visit.  We reviewed the resident's history and exam and pertinent patient test results.  I agree with the assessment, diagnosis, and plan of care documented in the resident's note. 

## 2013-08-12 ENCOUNTER — Telehealth: Payer: Self-pay | Admitting: Internal Medicine

## 2013-08-12 ENCOUNTER — Ambulatory Visit: Payer: Self-pay

## 2013-08-12 NOTE — Telephone Encounter (Signed)
Pt followed with Neurology today. Her blood was drawn at 4 PM and the site is still bleeding.  She want to know what to do still bleeding a lot after gauze and tap.  Advised to hold pressure, try guaze and tap for another 30 min. If no improvement come to the ED.  May need CBC checked for plts. This has happened to her before after blood draws. Home meds include Aspirin.   Shirlee Latch MD  [phone]

## 2013-10-01 ENCOUNTER — Emergency Department (HOSPITAL_COMMUNITY)
Admission: EM | Admit: 2013-10-01 | Discharge: 2013-10-02 | Disposition: A | Discharge status: Home / Self Care | Payer: No Typology Code available for payment source | Attending: Emergency Medicine | Admitting: Emergency Medicine

## 2013-10-01 ENCOUNTER — Encounter (HOSPITAL_COMMUNITY): Payer: Self-pay | Admitting: Emergency Medicine

## 2013-10-01 DIAGNOSIS — S0993XA Unspecified injury of face, initial encounter: Secondary | ICD-10-CM | POA: Insufficient documentation

## 2013-10-01 DIAGNOSIS — S99929A Unspecified injury of unspecified foot, initial encounter: Secondary | ICD-10-CM

## 2013-10-01 DIAGNOSIS — I10 Essential (primary) hypertension: Secondary | ICD-10-CM | POA: Insufficient documentation

## 2013-10-01 DIAGNOSIS — S99919A Unspecified injury of unspecified ankle, initial encounter: Secondary | ICD-10-CM

## 2013-10-01 DIAGNOSIS — Z860100 Personal history of colon polyps, unspecified: Secondary | ICD-10-CM | POA: Insufficient documentation

## 2013-10-01 DIAGNOSIS — Z862 Personal history of diseases of the blood and blood-forming organs and certain disorders involving the immune mechanism: Secondary | ICD-10-CM | POA: Insufficient documentation

## 2013-10-01 DIAGNOSIS — IMO0002 Reserved for concepts with insufficient information to code with codable children: Secondary | ICD-10-CM | POA: Insufficient documentation

## 2013-10-01 DIAGNOSIS — Z8719 Personal history of other diseases of the digestive system: Secondary | ICD-10-CM | POA: Insufficient documentation

## 2013-10-01 DIAGNOSIS — Z880 Allergy status to penicillin: Secondary | ICD-10-CM | POA: Insufficient documentation

## 2013-10-01 DIAGNOSIS — G709 Myoneural disorder, unspecified: Secondary | ICD-10-CM | POA: Insufficient documentation

## 2013-10-01 DIAGNOSIS — W108XXA Fall (on) (from) other stairs and steps, initial encounter: Secondary | ICD-10-CM | POA: Insufficient documentation

## 2013-10-01 DIAGNOSIS — E109 Type 1 diabetes mellitus without complications: Secondary | ICD-10-CM | POA: Insufficient documentation

## 2013-10-01 DIAGNOSIS — W19XXXA Unspecified fall, initial encounter: Secondary | ICD-10-CM

## 2013-10-01 DIAGNOSIS — F3289 Other specified depressive episodes: Secondary | ICD-10-CM | POA: Insufficient documentation

## 2013-10-01 DIAGNOSIS — Z8673 Personal history of transient ischemic attack (TIA), and cerebral infarction without residual deficits: Secondary | ICD-10-CM | POA: Insufficient documentation

## 2013-10-01 DIAGNOSIS — S82841A Displaced bimalleolar fracture of right lower leg, initial encounter for closed fracture: Secondary | ICD-10-CM

## 2013-10-01 DIAGNOSIS — I209 Angina pectoris, unspecified: Secondary | ICD-10-CM | POA: Insufficient documentation

## 2013-10-01 DIAGNOSIS — Z7982 Long term (current) use of aspirin: Secondary | ICD-10-CM | POA: Insufficient documentation

## 2013-10-01 DIAGNOSIS — S82843A Displaced bimalleolar fracture of unspecified lower leg, initial encounter for closed fracture: Secondary | ICD-10-CM | POA: Insufficient documentation

## 2013-10-01 DIAGNOSIS — Z8619 Personal history of other infectious and parasitic diseases: Secondary | ICD-10-CM | POA: Insufficient documentation

## 2013-10-01 DIAGNOSIS — S199XXA Unspecified injury of neck, initial encounter: Secondary | ICD-10-CM

## 2013-10-01 DIAGNOSIS — Y92009 Unspecified place in unspecified non-institutional (private) residence as the place of occurrence of the external cause: Secondary | ICD-10-CM | POA: Insufficient documentation

## 2013-10-01 DIAGNOSIS — S8990XA Unspecified injury of unspecified lower leg, initial encounter: Secondary | ICD-10-CM | POA: Insufficient documentation

## 2013-10-01 DIAGNOSIS — F329 Major depressive disorder, single episode, unspecified: Secondary | ICD-10-CM | POA: Insufficient documentation

## 2013-10-01 DIAGNOSIS — E785 Hyperlipidemia, unspecified: Secondary | ICD-10-CM | POA: Insufficient documentation

## 2013-10-01 DIAGNOSIS — J45909 Unspecified asthma, uncomplicated: Secondary | ICD-10-CM | POA: Insufficient documentation

## 2013-10-01 DIAGNOSIS — F411 Generalized anxiety disorder: Secondary | ICD-10-CM | POA: Insufficient documentation

## 2013-10-01 DIAGNOSIS — Y9389 Activity, other specified: Secondary | ICD-10-CM | POA: Insufficient documentation

## 2013-10-01 DIAGNOSIS — R011 Cardiac murmur, unspecified: Secondary | ICD-10-CM | POA: Insufficient documentation

## 2013-10-01 DIAGNOSIS — W1809XA Striking against other object with subsequent fall, initial encounter: Secondary | ICD-10-CM | POA: Insufficient documentation

## 2013-10-01 DIAGNOSIS — R404 Transient alteration of awareness: Secondary | ICD-10-CM | POA: Insufficient documentation

## 2013-10-01 DIAGNOSIS — Z794 Long term (current) use of insulin: Secondary | ICD-10-CM | POA: Insufficient documentation
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
## 2013-10-02 ENCOUNTER — Emergency Department (HOSPITAL_COMMUNITY): Payer: No Typology Code available for payment source

## 2013-10-02 MED ORDER — OXYCODONE-ACETAMINOPHEN 5-325 MG PO TABS
1.0000 | ORAL_TABLET | Freq: Once | ORAL | Status: AC
  Administered 2013-10-02: 1 via ORAL
  Filled 2013-10-02: qty 1

## 2013-10-02 MED ORDER — OXYCODONE-ACETAMINOPHEN 5-325 MG PO TABS: 1.0000 | ORAL_TABLET | Freq: Three times a day (TID) | ORAL | Status: DC | PRN

## 2013-10-02 NOTE — ED Notes (Signed)
Pt's 2 abrasions on R ankle cleaned and bacitracin ointment applied.

## 2013-10-02 NOTE — Discharge Instructions (Signed)
Please call your doctor for a followup appointment within 24-48 hours. When you talk to your doctor please let them know that you were seen in the emergency department and have them acquire all of your records so that they can discuss the findings with you and formulate a treatment plan to fully care for your new and ongoing problems. Please call and set-up an appointment with Orthopedics - will most likely need to get surgery  Please take medications as prescribed - while on pain medications there is to be no drinking alcohol, driving, operating any heavy machinery. If extra please dispose in a proper manner. Please do not take any extra Tylenol for this can lead to Tylenol overdose and liver issues Please keep leg elevated and rest - please DO NOT apply any pressure to the foot Please continue to monitor symptoms closely and if symptoms are to worsen or change (fever greater than 101, chills, chest pain, shortness of breath, difficulty breathing, worsening or changes to pain pattern, fall, injury, changes to skin color) please report back to the ED immediately     Ankle Fracture with Rehab Two bones in the ankle, the shinbone (tibia) and the bone of the outer ankle and lower leg (fibula), are susceptible to being fractured. The fracture may be a complete or an incomplete break of the bone. It is also common for the ligaments of the ankle joint to be injured at the same time as a fracture. SYMPTOMS   Severe pain in the ankle at the time of injury and/or when trying to move the ankle.  Feeling of popping or tearing in the inner or outer part of the ankle, sometimes as if the ankle joint was temporarily dislocated and popped back into place.  Cracking or other sounds may be heard at the time of fracture.  Severe tenderness in the ankle.  Swelling in the ankle and foot  Blisters around the ankle (uncommon).  Bleeding and bruising (contusion) in the ankle and foot.  Inability to stand or bear  weight on the injured foot.  Visible deformity if the fracture is complete and the bone fragments separate enough to distort normal leg contours.  Numbness and coldness in the foot if the blood supply is impaired. CAUSES  Bones break when subjected to a force that is greater than the their strength. Most fractures are due to direct trauma, such as being hit with an object or falling.  Fractured may also be caused by indirect stress, such as twisting, pivoting, or violent muscle contraction . RISK INCREASES WITH:  Sports that require quick changes in direction (football, soccer, or skiing).  Sports that require jumping (basketball, volleyball, distance jumping, or high jumping).  Walking or running on uneven or rough surfaces.  Shoes with inadequate support to prevent the foot and ankle from rolling over when stress occurs.  Bony abnormalities (osteoporosis or bone tumors).  Metabolic disorders, hormone problems, and nutritional deficiencies and disorders.  Poor strength and flexibility  Previous ankle injury. PREVENTION   Warm up and stretch properly before activity.  Maintain physical fitness:  Leg and ankle strength.  Flexibility and endurance.  Cardiovascular fitness.  Wear properly fitted protective equipment (high-top shoes or when appropriate and ankle bracing, taping, or splinting), especially for the first 12 months after an ankle injury. PROGNOSIS  If treated properly, ankle fractures typically heal well. RELATED COMPLICATIONS   Failure to heal (nonunion).  Healing in poor position (malunion).  Arrest of normal bone growth in children.  Proneness to repeated ankle injury.  Stiff ankle.  Unstable or arthritic ankle.  Infected skin blisters.  Prolonged healing time if activity is resumed too quickly. Risks of surgery, including infection, bleeding, injury to nerves (numbness, weakness, paralysis), and need for further surgery. TREATMENT  Treatment  initially consists of ice and medication to help reduce pain and inflammation. The joint must be immobilized to allow for healing. If the fracture is where the bones are out of alignment (displaced), then surgery may be necessary to realign (reduce) them. Surgery usually involves placing pins and screws in the bones to hold them in place while the fracture heals. After surgery the joint is immobilized. Bone growth stimulators may be used to promote bone growth, but this is uncommon, Strengthening and stretching exercises are usually necessary after immobilization in order to regain strength and a full range of motion. These exercises may be completed at home or with a therapist. If pins and screws are placed in the bone, they are not usually removed unless they become a source of pain. MEDICATION   If pain medication is necessary, then nonsteroidal anti-inflammatory medications, such as aspirin and ibuprofen, or other minor pain relievers, such as acetaminophen, are often recommended.  Do not take pain medication within 7 days before surgery.  Prescription pain relievers may be prescribed if deemed necessary by your caregiver. Use only as directed and only as much as you need. SEEK MEDICAL CARE IF:   Symptoms get worse or do not improve in 2 weeks despite treatment.  The following occur after immobilization or surgery:  Swelling above or below the fracture site.  Severe, persistent pain.  Blue or gray skin below the fracture site, especially under the nails, or numbness or loss of feeling below the fracture site. Report any of these signs immediately.  New, unexplained symptoms develop (drugs used in treatment may produce side effects). EXERCISES  RANGE OF MOTION (ROM) AND STRETCHING EXERCISES - Ankle Fracture These exercises may help you when beginning to rehabilitate your injury. Your symptoms may resolve with or without further involvement from your physician, physical therapist or athletic  trainer. While completing these exercises, remember:   Restoring tissue flexibility helps normal motion to return to the joints. This allows healthier, less painful movement and activity.  An effective stretch should be held for at least 30 seconds.  A stretch should never be painful. You should only feel a gentle lengthening or release in the stretched tissue. RANGE OF MOTION - Dorsi/Plantar Flexion  While sitting with your right / left knee straight, draw the top of your foot upwards by flexing your ankle. Then reverse the motion, pointing your toes downward.  Hold each position for __________ seconds.  After completing your first set of exercises, repeat this exercise with your knee bent. Repeat __________ times. Complete this exercise __________ times per day.  RANGE OF MOTION- Ankle Plantar Flexion   Sit with your right / left leg crossed over your opposite knee.  Use your opposite hand to pull the top of your foot and toes toward you.  You should feel a gentle stretch on the top of your foot/ankle. Hold this position for __________ seconds. Repeat __________ times. Complete __________ times per day.  RANGE OF MOTION - Ankle Eversion  Sit with your right / left ankle crossed over your opposite knee.  Grip your foot with your opposite hand, placing your thumb on the top of your foot and your fingers across the bottom of your foot.  Gently push your foot downward with a slight rotation so your littlest toes rise slightly  You should feel a gentle stretch on the inside of your ankle. Hold the stretch for __________ seconds. Repeat __________ times. Complete this exercise __________ times per day.  RANGE OF MOTION - Ankle Inversion  Sit with your right / left ankle crossed over your opposite knee.  Grip your foot with your opposite hand, placing your thumb on the bottom of your foot and your fingers across the top of your foot.  Gently pull your foot so the smallest toe comes  toward you and your thumb pushes the inside of the ball of your foot away from you.  You should feel a gentle stretch on the outside of your ankle. Hold the stretch for __________ seconds. Repeat __________ times. Complete this exercise __________ times per day.  RANGE OF MOTION - Ankle Alphabet  Imagine your right / left big toe is a pen.  Keeping your hip and knee still, write out the entire alphabet with your "pen." Make the letters as large as you can without increasing any discomfort. Repeat __________ times. Complete this exercise __________ times per day.  RANGE OF MOTION - Ankle Dorsiflexion, Active Assisted   Remove shoes and sit on a chair that is preferably not on a carpeted surface.  Place right / left foot under knee. Extend your opposite leg for support.  Keeping your heel down, slide your right / left foot back toward the chair until you feel a stretch at your ankle or calf. If you do not feel a stretch, slide your bottom forward to the edge of the chair, while still keeping your heel down.  Hold this stretch for __________ seconds. Repeat __________ times. Complete this stretch __________ times per day.  STRETCH - Gastrocsoleus   Sit with your right / left leg extended. Holding onto both ends of a belt or towel, loop it around the ball of your foot.  Keeping your right / left ankle and foot relaxed and your knee straight, pull your foot and ankle toward you using the belt/towel.  You should feel a gentle stretch behind your calf or knee. Hold this position for __________ seconds. Repeat __________ times. Complete this stretch __________ times per day.  STRENGTHENING EXERCISES - Ankle Fracture These exercises may help you when beginning to rehabilitate your injury. They may resolve your symptoms with or without further involvement from your physician, physical therapist or athletic trainer. While completing these exercises, remember:   Muscles can gain both the endurance  and the strength needed for everyday activities through controlled exercises.  Complete these exercises as instructed by your physician, physical therapist or athletic trainer. Progress the resistance and repetitions only as guided.  You may experience muscle soreness or fatigue, but the pain or discomfort you are trying to eliminate should never worsen during these exercises. If this pain does worsen, stop and make certain you are following the directions exactly. If the pain is still present after adjustments, discontinue the exercise until you can discuss the trouble with your clinician. STRENGTH - Dorsiflexors  Secure a rubber exercise band/tubing to a fixed object (table, pole) and loop the other end around your right / left foot.  Sit on the floor facing the fixed object. The band/tubing should be slightly tense when your foot is relaxed.  Slowly draw your foot back toward you using your ankle and toes.  Hold this position for __________ seconds. Slowly release the tension  in the band and return your foot to the starting position. Repeat __________ times. Complete this exercise __________ times per day.  STRENGTH - Plantar-flexors  Sit with your right / left leg extended. Holding onto both ends of a rubber exercise band/tubing, loop it around the ball of your foot. Keep a slight tension in the band.  Slowly push your toes away from you, pointing them downward.  Hold this position for __________ seconds. Return slowly, controlling the tension in the band/tubing. Repeat __________ times. Complete this exercise __________ times per day.  STRENGTH - Ankle Eversion  Secure one end of a rubber exercise band/tubing to a fixed object (table, pole). Loop the other end around your foot just before your toes.  Place your fists between your knees. This will focus your strengthening at your ankle.  Drawing the band/tubing across your opposite foot, slowly, pull your little toe out and up. Make  sure the band/tubing is positioned to resist the entire motion.  Hold this position for __________ seconds.  Have your muscles resist the band/tubing as it slowly pulls your foot back to the starting position. Repeat __________ times. Complete this exercise __________ times per day.  STRENGTH - Ankle Inversion  Secure one end of a rubber exercise band/tubing to a fixed object (table, pole). Loop the other end around your foot just before your toes.  Place your fists between your knees. This will focus your strengthening at your ankle.  Slowly, pull your big toe up and in, making sure the band/tubing is positioned to resist the entire motion.  Hold this position for __________ seconds.  Have your muscles resist the band/tubing as it slowly pulls your foot back to the starting position. Repeat __________ times. Complete this exercises __________ times per day.  STRENGTH - Towel Curls  Sit in a chair positioned on a non-carpeted surface.  Place your foot on a towel, keeping your heel on the floor.  Pull the towel toward your heel by only curling your toes. Keep your heel on the floor.  If instructed by your physician, physical therapist or athletic trainer, add weight at the end of the towel. Repeat __________ times. Complete this exercise __________ times per day. STRENGTH - Plantar-flexors, Standing   Stand with your feet shoulder width apart. Steady yourself with a wall or table using as little support as needed.  Keeping your weight evenly spread over the width of your feet, rise up on your toes.*  Hold this position for __________ seconds. Repeat __________ times. Complete this exercise __________ times per day.  *If this is too easy, shift your weight toward your right / left leg until you feel challenged. Ultimately, you may be asked to do this exercise with your right / left foot only. Document Released: 09/14/2004 Document Revised: 05/08/2011 Document Reviewed:  05/28/2008 [facility] Patient Information 2015 [employer], [place]. This information is not intended to replace advice given to you by your health care provider. Make sure you discuss any questions you have with your health care provider.

## 2013-10-02 NOTE — ED Provider Notes (Signed)
Medical screening examination/treatment/procedure(s) were conducted as a shared visit with non-physician practitioner(s) and myself.  I personally evaluated the patient during the encounter.  Pt c/o right ankle pain s/p mechanical fall.  Pt w bimalleolar fx right ankle. Pt w superficial abrasion to foot, not in region bony fracture/fx is closed. Ortho called. Distal pulses palp   Suzi Roots, MD 10/02/13 [phone]

## 2013-10-02 NOTE — ED Notes (Signed)
Ortho tech at bedside 

## 2013-10-03 ENCOUNTER — Other Ambulatory Visit: Payer: Self-pay | Admitting: *Deleted

## 2013-10-03 ENCOUNTER — Telehealth: Payer: Self-pay | Admitting: *Deleted

## 2013-10-03 NOTE — Telephone Encounter (Signed)
She was seen in ED 8/6 for ankle pain. She has not been seen in [facility] for this problem. She was Rx'd percocet in ED. I cannot refill this med. She will need to make appt to be seen.

## 2013-10-03 NOTE — Telephone Encounter (Signed)
Called pt and left message on ID recording - appt sch 10/06/13 8:30AM Dr Beckie Salts. We had trying to get crutches, splint, pain med and ortho appt. Pt called [facility] and talked to [facility] FU from ER - broken ankle.Stanton Kidney Ralpheal Zappone RN 10/03/13 3:20PM

## 2013-10-06 ENCOUNTER — Encounter: Payer: Self-pay | Admitting: Licensed Clinical Social Worker

## 2013-10-06 ENCOUNTER — Ambulatory Visit (INDEPENDENT_AMBULATORY_CARE_PROVIDER_SITE_OTHER): Payer: No Typology Code available for payment source | Admitting: Internal Medicine

## 2013-10-06 ENCOUNTER — Encounter: Payer: Self-pay | Admitting: Internal Medicine

## 2013-10-06 ENCOUNTER — Emergency Department (HOSPITAL_COMMUNITY)
Admission: EM | Admit: 2013-10-06 | Discharge: 2013-10-07 | Disposition: A | Discharge status: Home / Self Care | Payer: No Typology Code available for payment source | Attending: Emergency Medicine | Admitting: Emergency Medicine

## 2013-10-06 ENCOUNTER — Encounter (HOSPITAL_COMMUNITY): Payer: Self-pay | Admitting: Emergency Medicine

## 2013-10-06 ENCOUNTER — Ambulatory Visit: Payer: No Typology Code available for payment source | Admitting: Internal Medicine

## 2013-10-06 VITALS — BP 209/96 | HR 82 | Temp 97.9°F

## 2013-10-06 DIAGNOSIS — S9304XD Dislocation of right ankle joint, subsequent encounter: Secondary | ICD-10-CM

## 2013-10-06 DIAGNOSIS — R7402 Elevation of levels of lactic acid dehydrogenase (LDH): Secondary | ICD-10-CM

## 2013-10-06 DIAGNOSIS — M25579 Pain in unspecified ankle and joints of unspecified foot: Secondary | ICD-10-CM | POA: Insufficient documentation

## 2013-10-06 DIAGNOSIS — G43909 Migraine, unspecified, not intractable, without status migrainosus: Secondary | ICD-10-CM | POA: Insufficient documentation

## 2013-10-06 DIAGNOSIS — Z8673 Personal history of transient ischemic attack (TIA), and cerebral infarction without residual deficits: Secondary | ICD-10-CM | POA: Insufficient documentation

## 2013-10-06 DIAGNOSIS — Z79899 Other long term (current) drug therapy: Secondary | ICD-10-CM | POA: Insufficient documentation

## 2013-10-06 DIAGNOSIS — E1039 Type 1 diabetes mellitus with other diabetic ophthalmic complication: Secondary | ICD-10-CM

## 2013-10-06 DIAGNOSIS — F329 Major depressive disorder, single episode, unspecified: Secondary | ICD-10-CM | POA: Insufficient documentation

## 2013-10-06 DIAGNOSIS — Z7982 Long term (current) use of aspirin: Secondary | ICD-10-CM | POA: Insufficient documentation

## 2013-10-06 DIAGNOSIS — S82891A Other fracture of right lower leg, initial encounter for closed fracture: Secondary | ICD-10-CM

## 2013-10-06 DIAGNOSIS — S82841D Displaced bimalleolar fracture of right lower leg, subsequent encounter for closed fracture with routine healing: Secondary | ICD-10-CM

## 2013-10-06 DIAGNOSIS — E785 Hyperlipidemia, unspecified: Secondary | ICD-10-CM | POA: Insufficient documentation

## 2013-10-06 DIAGNOSIS — R011 Cardiac murmur, unspecified: Secondary | ICD-10-CM | POA: Insufficient documentation

## 2013-10-06 DIAGNOSIS — I209 Angina pectoris, unspecified: Secondary | ICD-10-CM | POA: Insufficient documentation

## 2013-10-06 DIAGNOSIS — R748 Abnormal levels of other serum enzymes: Secondary | ICD-10-CM | POA: Insufficient documentation

## 2013-10-06 DIAGNOSIS — F411 Generalized anxiety disorder: Secondary | ICD-10-CM | POA: Insufficient documentation

## 2013-10-06 DIAGNOSIS — Z860100 Personal history of colon polyps, unspecified: Secondary | ICD-10-CM | POA: Insufficient documentation

## 2013-10-06 DIAGNOSIS — F3289 Other specified depressive episodes: Secondary | ICD-10-CM | POA: Insufficient documentation

## 2013-10-06 DIAGNOSIS — Z880 Allergy status to penicillin: Secondary | ICD-10-CM | POA: Insufficient documentation

## 2013-10-06 DIAGNOSIS — Z8719 Personal history of other diseases of the digestive system: Secondary | ICD-10-CM | POA: Insufficient documentation

## 2013-10-06 DIAGNOSIS — Z862 Personal history of diseases of the blood and blood-forming organs and certain disorders involving the immune mechanism: Secondary | ICD-10-CM | POA: Insufficient documentation

## 2013-10-06 DIAGNOSIS — R740 Nonspecific elevation of levels of transaminase and lactic acid dehydrogenase [LDH]: Secondary | ICD-10-CM

## 2013-10-06 DIAGNOSIS — I10 Essential (primary) hypertension: Secondary | ICD-10-CM | POA: Insufficient documentation

## 2013-10-06 DIAGNOSIS — J45909 Unspecified asthma, uncomplicated: Secondary | ICD-10-CM | POA: Insufficient documentation

## 2013-10-06 DIAGNOSIS — G709 Myoneural disorder, unspecified: Secondary | ICD-10-CM | POA: Insufficient documentation

## 2013-10-06 DIAGNOSIS — S82899A Other fracture of unspecified lower leg, initial encounter for closed fracture: Secondary | ICD-10-CM

## 2013-10-06 DIAGNOSIS — E119 Type 2 diabetes mellitus without complications: Secondary | ICD-10-CM | POA: Insufficient documentation

## 2013-10-06 DIAGNOSIS — Z8619 Personal history of other infectious and parasitic diseases: Secondary | ICD-10-CM | POA: Insufficient documentation

## 2013-10-06 DIAGNOSIS — S8290XD Unspecified fracture of unspecified lower leg, subsequent encounter for closed fracture with routine healing: Secondary | ICD-10-CM | POA: Insufficient documentation

## 2013-10-06 LAB — LIPID PANEL
CHOLESTEROL: 174 mg/dL (ref 0–200)
HDL: 42 mg/dL (ref >39)
LDL Cholesterol: 110 mg/dL — ABNORMAL HIGH (ref 0–99)
Total CHOL/HDL Ratio: 4.1 Ratio
Triglycerides: 110 mg/dL (ref <150)
VLDL: 22 mg/dL (ref 0–40)

## 2013-10-06 LAB — GLUCOSE, CAPILLARY: Glucose-Capillary: 254 mg/dL — ABNORMAL HIGH (ref 70–99)

## 2013-10-06 LAB — COMPLETE METABOLIC PANEL WITH GFR
ALBUMIN: 3.6 g/dL (ref 3.5–5.2)
ALT: 16 U/L (ref 0–35)
AST: 16 U/L (ref 0–37)
Alkaline Phosphatase: 114 U/L (ref 39–117)
BUN: 10 mg/dL (ref 6–23)
CALCIUM: 8.8 mg/dL (ref 8.4–10.5)
CHLORIDE: 99 meq/L (ref 96–112)
CO2: 27 meq/L (ref 19–32)
Creat: 0.72 mg/dL (ref 0.50–1.10)
GLUCOSE: 235 mg/dL — AB (ref 70–99)
POTASSIUM: 4.3 meq/L (ref 3.5–5.3)
SODIUM: 136 meq/L (ref 135–145)
TOTAL PROTEIN: 6.5 g/dL (ref 6.0–8.3)
Total Bilirubin: 0.4 mg/dL (ref 0.2–1.2)

## 2013-10-06 LAB — POCT GLYCOSYLATED HEMOGLOBIN (HGB A1C): HEMOGLOBIN A1C: 10.2

## 2013-10-06 LAB — CBG MONITORING, ED: Glucose-Capillary: 70 mg/dL (ref 70–99)

## 2013-10-06 MED ORDER — OXYCODONE-ACETAMINOPHEN 5-325 MG PO TABS: 1.0000 | ORAL_TABLET | Freq: Four times a day (QID) | ORAL | Status: DC | PRN

## 2013-10-06 MED ORDER — OXYCODONE-ACETAMINOPHEN 5-325 MG PO TABS
1.0000 | ORAL_TABLET | Freq: Once | ORAL | Status: AC
  Administered 2013-10-06: 1 via ORAL
  Filled 2013-10-06: qty 1

## 2013-10-06 NOTE — Assessment & Plan Note (Signed)
Assessment: Pt with last lipid panel on 10/15/12 that was normal not currently on statin therapy with 10-yr ASCVD risk of 39.2% and lifetime risk of 50% with recommendations to start moderate or high intensity statin therapy.   Plan:  -Obtain annual lipid panel -Consider starting moderate or high intensity statin therapy  -Obtain CMP

## 2013-10-06 NOTE — Patient Instructions (Addendum)
-Take roxicet every 6 hrs as needed for pain  -You have an appt with orthopedic surgeon Dr. Carley Hammed at 8:45 AM, please arrive at 8:15 AM and bring your photo ID -Continue the splint and non-weight bearing. Try to elevate your foot  -Please take your blood pressure medications and come in for a visit specifically for this   Ankle Fracture A fracture is a break in a bone. The ankle joint is made up of three bones. These include the lower (distal)sections of your lower leg bones, called the tibia and fibula, along with a bone in your foot, called the talus. Depending on how bad the break is and if more than one ankle joint bone is broken, a cast or splint is used to protect and keep your injured bone from moving while it heals. Sometimes, surgery is required to help the fracture heal properly.  There are two general types of fractures:  Stable fracture. This includes a single fracture line through one bone, with no injury to ankle ligaments. A fracture of the talus that does not have any displacement (movement of the bone on either side of the fracture line) is also stable.  Unstable fracture. This includes more than one fracture line through one or more bones in the ankle joint. It also includes fractures that have displacement of the bone on either side of the fracture line. CAUSES  A direct blow to the ankle.   Quickly and severely twisting your ankle.  Trauma, such as a car accident or falling from a significant height. RISK FACTORS You may be at a higher risk of ankle fracture if:  You have certain medical conditions.  You are involved in high-impact sports.  You are involved in a high-impact car accident. SIGNS AND SYMPTOMS   Tender and swollen ankle.  Bruising around the injured ankle.  Pain on movement of the ankle.  Difficulty walking or putting weight on the ankle.  A cold foot below the site of the ankle injury. This can occur if the blood vessels passing through  your injured ankle were also damaged.  Numbness in the foot below the site of the ankle injury. DIAGNOSIS  An ankle fracture is usually diagnosed with a physical exam and X-rays. A CT scan may also be required for complex fractures. TREATMENT  Stable fractures are treated with a cast or splint and using crutches to avoid putting weight on your injured ankle. This is followed by an ankle strengthening program. Some patients require a special type of cast, depending on other medical problems they may have. Unstable fractures require surgery to ensure the bones heal properly. Your health care provider will tell you what type of fracture you have and the best treatment for your condition. HOME CARE INSTRUCTIONS   Review correct crutch use with your health care provider and use your crutches as directed. Safe use of crutches is extremely important. Misuse of crutches can cause you to fall or cause injury to nerves in your hands or armpits.  Do not put weight or pressure on the injured ankle until directed by your health care provider.  To lessen the swelling, keep the injured leg elevated while sitting or lying down.  Apply ice to the injured area:  Put ice in a plastic bag.  Place a towel between your cast and the bag.  Leave the ice on for 20 minutes, 2-3 times a day.  If you have a plaster or fiberglass cast:  Do not try to  scratch the skin under the cast with any objects. This can increase your risk of skin infection.  Check the skin around the cast every day. You may put lotion on any red or sore areas.  Keep your cast dry and clean.  If you have a plaster splint:  Wear the splint as directed.  You may loosen the elastic around the splint if your toes become numb, tingle, or turn cold or blue.  Do not put pressure on any part of your cast or splint; it may break. Rest your cast only on a pillow the first 24 hours until it is fully hardened.  Your cast or splint can be protected  during bathing with a plastic bag sealed to your skin with medical tape. Do not lower the cast or splint into water.  Take medicines as directed by your health care provider. Only take over-the-counter or prescription medicines for pain, discomfort, or fever as directed by your health care provider.  Do not drive a vehicle until your health care provider specifically tells you it is safe to do so.  If your health care provider has given you a follow-up appointment, it is very important to keep that appointment. Not keeping the appointment could result in a chronic or permanent injury, pain, and disability. If you have any problem keeping the appointment, call the facility for assistance. SEEK MEDICAL CARE IF: You develop increased swelling or discomfort. SEEK IMMEDIATE MEDICAL CARE IF:   Your cast gets damaged or breaks.  You have continued severe pain.  You develop new pain or swelling after the cast was put on.  Your skin or toenails below the injury turn blue or gray.  Your skin or toenails below the injury feel cold, numb, or have loss of sensitivity to touch.  There is a bad smell or pus draining from under the cast. MAKE SURE YOU:   Understand these instructions.  Will watch your condition.  Will get help right away if you are not doing well or get worse. Document Released: 02/11/2000 Document Revised: 02/18/2013 Document Reviewed: 09/12/2012 [facility] Patient Information 2015 [place], [place]. This information is not intended to replace advice given to you by your health care provider. Make sure you discuss any questions you have with your health care provider.   General Instructions:   Please try to bring all your medicines next time. This will help us keep you safe from mistakes.   Progress Toward Treatment Goals:  Treatment Goal 08/05/2013  Hemoglobin A1C deteriorated  Blood pressure deteriorated  Prevent falls at goal    Self Care Goals & Plans:  Self Care Goal  10/06/2013  Manage my medications take my medicines as prescribed; bring my medications to every visit; refill my medications on time; follow the sick day instructions if I am sick  Monitor my health keep track of my blood glucose; check my feet daily  Eat healthy foods eat more vegetables; eat fruit for snacks and desserts; eat baked foods instead of fried foods; eat foods that are low in salt; eat smaller portions; drink diet soda or water instead of juice or soda  Be physically active find an activity I enjoy  Prevent falls -  Meeting treatment goals -    Home Blood Glucose Monitoring 08/05/2013  Check my blood sugar 3 times a day  When to check my blood sugar before breakfast; before dinner; at bedtime     Care Management & Community Referrals:  Referral 08/05/2013  Referrals made for  care management support diabetes educator  Referrals made to community resources none

## 2013-10-06 NOTE — Progress Notes (Signed)
Patient ID: Debra [name], female   DOB: [birth year], 48 y.o.   MRN: [id number]    Subjective:   Patient ID: Debra [name] female   DOB: 1965/06/07 48 y.o.   MRN: [id number]  HPI: [name] is a 48 y.o. woman with past medical history of Type I DM, uncontrolled hypertension, asthma, and depression/anxiety who presents for recent right ankle fracture.   She was seen in the ED on 10/01/13 after a mechanical fall and found to have a right bimalleolar fracture with dislocation of the ankle. She reports having prior injury to the right foot with surgery in 2007. She was to follow-up with orthopedic surgeon Dr. Ave Filter however due to lack of insurance was not able to be seen. Her leg was put in a splint while in the ED (which has not been uncovered since 10/01/13) and she reports using a wheelchair at home. She also reports falling three times since the fracture. She was not able to obtain vicodin that was prescribed to her in the ED because she could not afford it. She has not used any OTC pain medications and her pain level is intolerable.   She reports compliance with taking clonidine, carvedilol, amlodipine, imdur, newly increased losartan (50 to 100 mg daily), and newly added spironolactone for hypertension. She denies headache, blurry vision from baseline, chest pain, palpitations, LE edema, or lightheadedness.   Her last A1c was 10.9 on 06/09/13 and reports compliance with taking Lantus 33 U BID and Humalog 20 U TID. She did not bring her glucose meter today but reports having symptomatic low's and high's. She reports chronic polydipsia , blurry vision, and peripheral neuropathy, but denies polyuria or polyphagia. She tries to follow a healthy diet and has not been able to exercise due to her recent injury. Her weight has been stable.       Past Medical History  Diagnosis Date  . Allergy     seasonal  . Heart murmur     Birth  . Hyperlipidemia     2005  . Stroke     in 1990  .  Neuromuscular disorder     Neuropathy - hands/feet  . Anxiety     1990  . Anemia     2005  . Asthma     2000  . Diabetes mellitus     type i juvenille; 1988  . Hypertension     1998  . Mental disorder     Depression 1990  . Anal fissure   . Hx of adenomatous colonic polyps   . Internal hemorrhoids 04/24/06    on colonoscopy  . Migraine   . Cataract   . GERD (gastroesophageal reflux disease)   . Staphylococcus aureus infection     toe  . Family history of malignant neoplasm of gastrointestinal tract   . Anginal pain   . Depression 10/16/2012   Current Outpatient Prescriptions  Medication Sig Dispense Refill  . albuterol (PROVENTIL HFA;VENTOLIN HFA) 108 (90 BASE) MCG/ACT inhaler Inhale 1-2 puffs into the lungs every 6 (six) hours as needed for wheezing or shortness of breath.  3 Inhaler  1  . amLODipine (NORVASC) 10 MG tablet Take 1 tablet (10 mg total) by mouth daily. Daily  30 tablet  11  . aspirin EC 81 MG tablet Take 325 mg by mouth daily.       . carvedilol (COREG) 3.125 MG tablet Take 1 tablet (3.125 mg total) by mouth 2 (two) times daily.  60  tablet  11  . cloNIDine (CATAPRES) 0.3 MG tablet Take 1 tablet (0.3 mg total) by mouth 2 (two) times daily.  60 tablet  1  . colesevelam (WELCHOL) 625 MG tablet Take 1,250 mg by mouth 2 (two) times daily.      . DULoxetine (CYMBALTA) 30 MG capsule Take 1 capsule (30 mg total) by mouth daily.  30 capsule  0  . insulin aspart (NOVOLOG FLEXPEN) 100 UNIT/ML FlexPen Inject 20 Units into the skin 3 (three) times daily with meals.  15 mL  11  . insulin glargine (LANTUS) 100 UNIT/ML injection Inject 0.33 mLs (33 Units total) into the skin 2 (two) times daily.  45 mL  1  . isosorbide mononitrate (IMDUR) 30 MG 24 hr tablet Take 30 mg by mouth daily as needed (chest pain, pressure).       . nitroGLYCERIN (NITROSTAT) 0.4 MG SL tablet Place 1 tablet (0.4 mg total) under the tongue every 5 (five) minutes as needed.  100 tablet  3  . olopatadine  (PATANOL) 0.1 % ophthalmic solution Place 1 drop into both eyes 2 (two) times daily.      Marland Kitchen. oxyCODONE-acetaminophen (PERCOCET/ROXICET) 5-325 MG per tablet Take 1 tablet by mouth every 8 (eight) hours as needed for moderate pain or severe pain.  11 tablet  0  . oxymetazoline (AFRIN) 0.05 % nasal spray Place 3 sprays into the nose 2 (two) times daily.        Marland Kitchen. spironolactone (ALDACTONE) 25 MG tablet Take 1 tablet (25 mg total) by mouth daily.  90 tablet  3  . traMADol (ULTRAM) 50 MG tablet Take 1-2 tablets (50-100 mg total) by mouth every 6 (six) hours as needed.  120 tablet  1   No current facility-administered medications for this visit.   Family History  Problem Relation Age of Onset  . Hypertension Mother   . Kidney disease Mother   . Hypertension Father   . Breast cancer Maternal Grandmother   . Prostate cancer Maternal Grandfather   . Ovarian cancer Paternal Grandmother   . Prostate cancer Paternal Grandfather   . Colon cancer Maternal Uncle    History   Social History  . Marital Status: Divorced    Spouse Name: N/A    Number of Children: 2  . Years of Education: 14   Occupational History  . Unemployed    Social History Main Topics  . Smoking status: Never Smoker   . Smokeless tobacco: Never Used  . Alcohol Use: No  . Drug Use: No     Comment: drug addict  . Sexual Activity: Yes    Birth Control/ Protection: IUD   Other Topics Concern  . Not on file   Social History Narrative   Lost medicaid about 2009 ish when her youngest child turned 318. Has adult children. Lives with boyfriend who financially supports her. Has attempted to get disability but has been turned down.      2-3 caffeine drinks daily    Review of Systems: Review of Systems  Constitutional: Negative for fever, chills and weight loss.  Eyes: Positive for blurred vision (chronic ).  Respiratory: Negative for cough, shortness of breath and wheezing.   Cardiovascular: Positive for leg swelling (right  leg). Negative for chest pain and palpitations.  Gastrointestinal: Negative for nausea, vomiting, abdominal pain, diarrhea and constipation.  Genitourinary: Negative for dysuria, urgency and frequency.  Musculoskeletal: Positive for falls and joint pain (right ankle).  Neurological: Negative for dizziness, sensory change  and headaches.  Endo/Heme/Allergies: Positive for polydipsia (chronic).    Objective:  Physical Exam: Filed Vitals:   10/06/13 0849  BP: 209/96  Pulse: 82  Temp: 97.9 F (36.6 C)  TempSrc: Oral  SpO2: 98%    Physical Exam  Constitutional: She is oriented to person, place, and time. She appears well-developed and well-nourished. No distress.  HENT:  Head: Normocephalic and atraumatic.  Eyes: EOM are normal.  Neck: Normal range of motion. Neck supple.  Cardiovascular: Normal rate, regular rhythm and normal heart sounds.   Pulmonary/Chest: Effort normal and breath sounds normal. No respiratory distress. She has no wheezes. She has no rales.  Abdominal: Soft. Bowel sounds are normal. She exhibits no distension. There is no tenderness. There is no rebound and no guarding.  Musculoskeletal: She exhibits no edema and no tenderness.  Right LE in splint   Neurological: She is alert and oriented to person, place, and time.  Skin: Skin is warm and dry. No rash noted. She is not diaphoretic. No erythema. No pallor.  Psychiatric: She has a normal mood and affect. Her behavior is normal. Judgment and thought content normal.    Assessment & Plan:   Please see problem list for problem-based assessment and plan

## 2013-10-06 NOTE — Assessment & Plan Note (Addendum)
Assessment: Pt with chronic uncontrolled hypertension noncompliant with six-class (alpha-2 agoinst, BB, CCB, nitrate, ARB, diuretic) anti-hypertensive therapy who presents with asymptomatic blood pressure of 209/96.   Plan:  -BP 209/96 not at goal <140/90 -Dr Selena Batten spoke with her pharmacy, pt seems to be only on amlodipine and benicar -Return at next visit to address non-compliance  -Continue amlodipine 10 mg daily, carvedilol 3.125 mg BID, clonidine 0.3 mg BID, spironolactone 25 mg daily, imdur 30 mg daily PRN (losartan 100 mg daily was discontinued after ED admission) -Obtain CMP

## 2013-10-06 NOTE — Assessment & Plan Note (Signed)
Assessment: Pt with elevated AST and ALT on 06/09/13 with no recent hepatitis panel.   Plan: -Obtain CMP -If AST/ALT still elevated, consider acute hepatitis panel

## 2013-10-06 NOTE — ED Provider Notes (Signed)
CSN: [id number]     Arrival date & time 10/06/13  2015 History   This chart was scribed for Terri Piedraourtney Forcucci, PA-C working with Ethelda ChickMartha K Linker, MD by Evon Slackerrance Branch, ED Scribe. This patient was seen in room TR08C/TR08C and the patient's care was started at 10:35 PM.     Chief Complaint  Patient presents with  . Ankle Pain   The history is provided by the patient. No language interpreter was used.   HPI Comments: Debra [name] is a 48 y.o. female who presents to the Emergency Department with a Hx of Diabetes complaining of sharp throbbing ankle pain onset 5 days prior. She was diagnosed with an ankle fracture 5 days prior. She states that her foot has been splinted. She states that her ankle is dislocated and fractured in two places. She states that she was not able to see an orthopedist due to not having insurance. She states she hopes to have something done today in the ED.  She states that she fell when she fractured the ankle and and has some small abrasions under the splint that she is concerned about getting infected. She states that the splint hasn't been changed since her initial visit to the ED. Pt states that she thinks the splint is too tight and she is loosing circulation to her foot. She states she has had some numbness in the foot due to the splint being wrapped too tight.   Past Medical History  Diagnosis Date  . Allergy     seasonal  . Heart murmur     Birth  . Hyperlipidemia     2005  . Stroke     in 1990  . Neuromuscular disorder     Neuropathy - hands/feet  . Anxiety     1990  . Anemia     2005  . Asthma     2000  . Diabetes mellitus     type i juvenille; 1988  . Hypertension     1998  . Mental disorder     Depression 1990  . Anal fissure   . Hx of adenomatous colonic polyps   . Internal hemorrhoids 04/24/06    on colonoscopy  . Migraine   . Cataract   . GERD (gastroesophageal reflux disease)   . Staphylococcus aureus infection     toe  . Family  history of malignant neoplasm of gastrointestinal tract   . Anginal pain   . Depression 10/16/2012   Past Surgical History  Procedure Laterality Date  . Cholecystectomy    . Trigger finger release      x 3  . Eye surgery    . Cesarean section    . Bilateral foot surgery    . Laparoscopic tubal ligation  12/29/2010    Procedure: LAPAROSCOPIC TUBAL LIGATION;  Surgeon: Tereso NewcomerUgonna A Anyanwu, MD;  Location: [facility];  Service: Gynecology;  Laterality: Bilateral;  . Iud removal  12/29/2010    Procedure: INTRAUTERINE DEVICE (IUD) REMOVAL;  Surgeon: Tereso NewcomerUgonna A Anyanwu, MD;  Location: [facility];  Service: Gynecology;  Laterality: N/A;  . Hysteroscopy  12/29/2010    Procedure: HYSTEROSCOPY WITH HYDROTHERMAL ABLATION;  Surgeon: Tereso NewcomerUgonna A Anyanwu, MD;  Location: [facility];  Service: Gynecology;;  . Dilation and curettage of uterus  12/29/2010    Procedure: DILATATION AND CURETTAGE (D&C);  Surgeon: Tereso NewcomerUgonna A Anyanwu, MD;  Location: [facility];  Service: Gynecology;;   Family History  Problem Relation Age of Onset  . Hypertension Mother   .  Kidney disease Mother   . Hypertension Father   . Breast cancer Maternal Grandmother   . Prostate cancer Maternal Grandfather   . Ovarian cancer Paternal Grandmother   . Prostate cancer Paternal Grandfather   . Colon cancer Maternal Uncle    History  Substance Use Topics  . Smoking status: Never Smoker   . Smokeless tobacco: Never Used  . Alcohol Use: No   OB History   Grav Para Term Preterm Abortions TAB SAB Ect Mult Living   2 2 1 1  0 0 0 0 0 2     Review of Systems  Musculoskeletal: Positive for arthralgias and gait problem.  Skin: Positive for wound.  All other systems reviewed and are negative.  Allergies  Penicillins  Home Medications   Prior to Admission medications   Medication Sig Start Date End Date Taking? Authorizing Provider  albuterol (PROVENTIL HFA;VENTOLIN HFA) 108 (90 BASE) MCG/ACT inhaler Inhale 1-2 puffs into the lungs every 6 (six) hours as  needed for wheezing or shortness of breath. 03/25/13 03/25/14  Rocco Serene, MD  amLODipine (NORVASC) 10 MG tablet Take 1 tablet (10 mg total) by mouth daily. Daily 11/08/12   Rocco Serene, MD  aspirin EC 81 MG tablet Take 325 mg by mouth daily.     Historical Provider, MD  carvedilol (COREG) 3.125 MG tablet Take 1 tablet (3.125 mg total) by mouth 2 (two) times daily. 12/12/12 12/12/13  Linward Headland, MD  cloNIDine (CATAPRES) 0.3 MG tablet Take 1 tablet (0.3 mg total) by mouth 2 (two) times daily. 11/08/12   Rocco Serene, MD  colesevelam [facility]) 625 MG tablet Take 1,250 mg by mouth 2 (two) times daily. 04/21/13 04/21/14  Historical Provider, MD  DULoxetine (CYMBALTA) 30 MG capsule Take 1 capsule (30 mg total) by mouth daily.    Rocco Serene, MD  insulin aspart (NOVOLOG FLEXPEN) 100 UNIT/ML FlexPen Inject 20 Units into the skin 3 (three) times daily with meals. 03/06/13   Rocco Serene, MD  insulin glargine (LANTUS) 100 UNIT/ML injection Inject 0.33 mLs (33 Units total) into the skin 2 (two) times daily.  07/16/14  Rocco Serene, MD  isosorbide mononitrate (IMDUR) 30 MG 24 hr tablet Take 30 mg by mouth daily as needed (chest pain, pressure).     Historical Provider, MD  nitroGLYCERIN (NITROSTAT) 0.4 MG SL tablet Place 1 tablet (0.4 mg total) under the tongue every 5 (five) minutes as needed. 03/19/13   Rocco Serene, MD  olopatadine (PATANOL) 0.1 % ophthalmic solution Place 1 drop into both eyes 2 (two) times daily.    Historical Provider, MD  oxyCODONE-acetaminophen (PERCOCET/ROXICET) 5-325 MG per tablet Take 1-2 tablets by mouth every 6 (six) hours as needed for moderate pain or severe pain. 10/07/13   Christena Sunderlin A Forcucci, PA-C  oxyCODONE-acetaminophen (ROXICET) 5-325 MG per tablet Take 1 tablet by mouth every 6 (six) hours as needed. 10/06/13 10/06/14  Otis Brace, MD  oxymetazoline (AFRIN) 0.05 % nasal spray Place 3 sprays into the nose 2 (two) times daily.      Historical Provider, MD  spironolactone  (ALDACTONE) 25 MG tablet Take 1 tablet (25 mg total) by mouth daily. 08/05/13   Henryk Ursin Paris, MD   Triage Vitals: BP 175/84  Temp(Src) 98.6 F (37 C)  Resp 18  SpO2 95%  Physical Exam  Nursing note and vitals reviewed. Constitutional: She is oriented to person, place, and time. She appears well-developed and well-nourished. No distress.  HENT:  Head: Normocephalic and atraumatic.  Mouth/Throat: Oropharynx is clear and moist. No oropharyngeal exudate.  Eyes: Conjunctivae are normal. No scleral icterus.  Neck: Normal range of motion. Neck supple. No JVD present. No thyromegaly present.  Cardiovascular: Normal rate, regular rhythm and intact distal pulses.  Exam reveals no gallop and no friction rub.   Murmur heard. Pulses:      Dorsalis pedis pulses are 2+ on the right side, and 2+ on the left side.       Posterior tibial pulses are 2+ on the right side, and 2+ on the left side.  Pulmonary/Chest: Effort normal and breath sounds normal. No respiratory distress. She has no wheezes. She has no rales. She exhibits no tenderness.  Musculoskeletal:       Right ankle: She exhibits decreased range of motion and swelling. She exhibits no ecchymosis, no deformity, no laceration and normal pulse. Tenderness. Lateral malleolus and medial malleolus tenderness found. No AITFL, no CF ligament, no posterior TFL, no head of 5th metatarsal and no proximal fibula tenderness found.  2+ capillary refill of the right toes.  Patient is able to actively flex all toes with pain and dorsiflex the calf with pain.  Two abrasions are noted over the lateral ankle with no signs of erythema or discharge.    Lymphadenopathy:    She has no cervical adenopathy.  Neurological: She is alert and oriented to person, place, and time.  Skin: Skin is warm and dry. She is not diaphoretic.  Psychiatric: She has a normal mood and affect. Her behavior is normal. Judgment and thought content normal.    ED Course  Procedures  (including critical care time) DIAGNOSTIC STUDIES: Oxygen Saturation is 95% on RA, normal by my interpretation.    COORDINATION OF CARE:    Labs Review Labs Reviewed  CBG MONITORING, ED    Imaging Review No results found.   EKG Interpretation None      MDM   Final diagnoses:  Bimalleolar ankle fracture, right, closed, with routine healing, subsequent encounter  Ankle dislocation, right, subsequent encounter   Patient presents to the ED with bilateral malleolar ankle fractures and ankle dislocation.  I have examined the foot.  Good cap refill and ability to move the foot and toes.  Good pulses.  No signs of compartment syndrome at this time.  Have resplinted the foot at this time.  Spoke with the oncall [facility] doc again today.  Patient is to speak with Evern Core tomorrow morning to get an appointment to be seen.  Patient will be given percocet prescription here at this time.    I personally performed the services described in this documentation, which was scribed in my presence. The recorded information has been reviewed and is accurate.      Eben Burow, PA-C 10/07/13 0325  Eben Burow, PA-C 10/07/13 [phone]

## 2013-10-06 NOTE — Progress Notes (Signed)
Orthopedic Tech Progress Note Patient Details:  Debra [name] 1965-05-20 [id number]  Ortho Devices Type of Ortho Device: Post (short leg) splint Ortho Device/Splint Interventions: Application   Haskell Flirt 10/06/2013, 11:26 PM

## 2013-10-06 NOTE — Progress Notes (Signed)
Debra [name] is uninsured and has the [facility] [employer].  Pt's EMR states pt lost Medicaid coverage in 2009 once her child age in to adulthood.  Pt is currently unemployed and has previously been denied for disability.  Debra [name] states she lives alone with no assistance.  Debra [name] is currently not staying at her current residence due to inability to use the stairs.  Pt does not know address of current living arrangement.  Debra [name] states she was able to obtain transportation to today's appointment from an unknown person.  Pt confident this person will take her home today, pt's crutches are in person's car.  CSW discussed medical transportation available through [facility] card, unable to complete application today because Debra [name] does not know the address.  Pt provided CSW will working telephone numbers.  Debra [name] will notify CSW of current address and if there should be a need for medical transportation.  CSW unaware of charity program for surgical care.  Pt concerned regarding her ability to set up a payment plan, as she has no income.  CSW has inquired with another Camera operator and will notify  Debra [name] if other options are known.  Pt denies need for food and transportation today.

## 2013-10-06 NOTE — Assessment & Plan Note (Addendum)
Assessment: Pt with last A1c of 10.9 on 06/09/13 compliant with insulin therapy with frequent symptomatic hypoglycemia who presents with CBG of 254 and mildly improved A1c of 10.2.   Plan: -A1c 10.2 not at goal <7, continue Lantus 33 U daily and Humalog 20 U TID, pt to bring glucose meter at next visit for insulin adjustment -BP 209/96 not at goal <140/90, pt noncompliant and it is unsure which medications she is taking, follow-up appt for uncontrolled hypertension at future visit  -Obtain annual lipid panel, last LDL 90 at goal <100 not on statin therapy -Last annual eye exam on 06/06/13 -Due to annual foot exam at next visit (last one 10/15/12)  -BMI 36 not at goal <25, encourage weight loss  -Continue aspirin 81 mg daily for

## 2013-10-06 NOTE — Assessment & Plan Note (Addendum)
Assessment: Pt with bimalleolar fracture dislocation of the right ankle on 10/01/13 after mechanical fall with need for surgical intervention who presents with uncontrolled pain not currently on pain medications.   Plan: -Called orthopedic surgery office and set up appt with Dr. Ave Filter on 10/08/13 at 8:45 AM (office will work with her for a payment plan) -Prescribe oxycodone-acetaminophen 5-325 mg Q 6 hr PRN pain (60 pills with zero refills), our office to supply her with money to obtain it -Continue splint, non-weight bearing, and elevation of right LE

## 2013-10-06 NOTE — ED Notes (Addendum)
Presents with right leg pain-post fall on Wednesday of last week DX with ankle fracture and dislocation, right leg in splint. She has not taken pain medication until last night, and had no pain medication today. unable to see orthopedic doctor. Pain is worse. Toes are warm with brisk refill, able to wiggle digits.

## 2013-10-06 NOTE — ED Notes (Signed)
On arrival to room TR8 pt stated her blood sugar felt low. CBG ordered.

## 2013-10-06 NOTE — ED Notes (Signed)
Ortho tech called for splint placement. 

## 2013-10-07 MED ORDER — OXYCODONE-ACETAMINOPHEN 5-325 MG PO TABS: 1.0000 | ORAL_TABLET | Freq: Four times a day (QID) | ORAL | Status: DC | PRN

## 2013-10-07 NOTE — ED Provider Notes (Signed)
Medical screening examination/treatment/procedure(s) were performed by non-physician practitioner and as supervising physician I was immediately available for consultation/collaboration.   EKG Interpretation None       Martha K Linker, MD 10/07/13 1517 

## 2013-10-07 NOTE — Progress Notes (Signed)
[name] left message with CSW providing updated address.  However, address is not correct with the [employer].  CSW returned call to [name]. Correct address obtained: [address] [place] [address].  Pt notified would need at least 3 days for scheduling.  Pt states that she is unable to maneuver her wheelchair out of the home on her own and she is unable to bear weight on her crutches due to multiple hand/wrist surgeries.  CSW unsure how much assistance [employer] driver would be able to provide.  Pt requesting CSW to fax application to [employer] for future use.  Application faxed to [employer]  During this telephone conversation, [name] inquired about applying for disability, emergency Medicaid or fast tracking disability application.  CSW forwarded [name] the [place] and DSS.  Discussed in general for adult medicaid, someone would need over $5000 in medical expenses to be approved.  Pt notified [name] do not complete Disability forms from third parties.   Medical records are sent once [facility] and medical records requested.  Pt provided with information to [facility].

## 2013-10-07 NOTE — Discharge Instructions (Signed)
Cast or Splint Care °Casts and splints support injured limbs and keep bones from moving while they heal.  °HOME CARE °· Keep the cast or splint uncovered during the drying period. °¨ A plaster cast can take 24 to 48 hours to dry. °¨ A fiberglass cast will dry in less than 1 hour. °· Do not rest the cast on anything harder than a pillow for 24 hours. °· Do not put weight on your injured limb. Do not put pressure on the cast. Wait for your doctor's approval. °· Keep the cast or splint dry. °¨ Cover the cast or splint with a plastic bag during baths or wet weather. °¨ If you have a cast over your chest and belly (trunk), take sponge baths until the cast is taken off. °¨ If your cast gets wet, dry it with a towel or blow dryer. Use the cool setting on the blow dryer. °· Keep your cast or splint clean. Wash a dirty cast with a damp cloth. °· Do not put any objects under your cast or splint. °· Do not scratch the skin under the cast with an object. If itching is a problem, use a blow dryer on a cool setting over the itchy area. °· Do not trim or cut your cast. °· Do not take out the padding from inside your cast. °· Exercise your joints near the cast as told by your doctor. °· Raise (elevate) your injured limb on 1 or 2 pillows for the first 1 to 3 days. °GET HELP IF: °· Your cast or splint cracks. °· Your cast or splint is too tight or too loose. °· You itch badly under the cast. °· Your cast gets wet or has a soft spot. °· You have a bad smell coming from the cast. °· You get an object stuck under the cast. °· Your skin around the cast becomes red or sore. °· You have new or more pain after the cast is put on. °GET HELP RIGHT AWAY IF: °· You have fluid leaking through the cast. °· You cannot move your fingers or toes. °· Your fingers or toes turn blue or white or are cool, painful, or puffy (swollen). °· You have tingling or lose feeling (numbness) around the injured area. °· You have bad pain or pressure under the  cast. °· You have trouble breathing or have shortness of breath. °· You have chest pain. °Document Released: 06/15/2010 Document Revised: 10/16/2012 Document Reviewed: 08/22/2012 °Exit[employer]® Patient Information ©2015 [employer]. This information is not intended to replace advice given to you by your health care provider. Make sure you discuss any questions you have with your health care provider. ° °

## 2013-10-07 NOTE — Progress Notes (Signed)
Case discussed with Dr. Rabbani at the time of the visit.  We reviewed the resident's history and exam and pertinent patient test results.  I agree with the assessment, diagnosis, and plan of care documented in the resident's note. 

## 2013-10-08 ENCOUNTER — Encounter (HOSPITAL_COMMUNITY): Payer: Self-pay | Admitting: Pharmacy Technician

## 2013-10-08 ENCOUNTER — Encounter (HOSPITAL_COMMUNITY): Payer: Self-pay | Admitting: *Deleted

## 2013-10-08 ENCOUNTER — Other Ambulatory Visit: Payer: Self-pay | Admitting: Orthopedic Surgery

## 2013-10-08 NOTE — Progress Notes (Signed)
Pt denies SOB and chest pain but stated that she was last seen her cardiologist,  Dr. Nanetta Batty in 2007 or 2008. Pt stated that she had an echo, stress and cardiac cath done; echo and cath results were requested. Pt denies having a laparoscopic tubal ligation but stated that she had an ablation instead. Pt stated that she was told by surgeons office that HS insulin instructions would be given with with pre-op instructions. Pt was advised to call surgeon but was advised by Revonda Standard, PA ( anesthesia) to take 20-25 units of Lantus and to call [facility] in the AM (5:30) if any problems with blood glucose levels. Pt advised to take only small sips of clears ( per Revonda Standard) such as apple juice or cranberry juice if blood glucose is low and treatment is needed.

## 2013-10-09 ENCOUNTER — Ambulatory Visit (HOSPITAL_COMMUNITY)
Admission: RE | Admit: 2013-10-09 | Discharge: 2013-10-09 | Disposition: A | Discharge status: Home / Self Care | Payer: Self-pay | Source: Ambulatory Visit | Attending: Orthopedic Surgery | Admitting: Orthopedic Surgery

## 2013-10-09 ENCOUNTER — Encounter (INDEPENDENT_AMBULATORY_CARE_PROVIDER_SITE_OTHER): Payer: Self-pay | Admitting: Ophthalmology

## 2013-10-09 ENCOUNTER — Ambulatory Visit (HOSPITAL_COMMUNITY): Payer: No Typology Code available for payment source

## 2013-10-09 ENCOUNTER — Encounter (HOSPITAL_COMMUNITY)
Admission: RE | Disposition: A | Discharge status: Home / Self Care | Payer: Self-pay | Source: Ambulatory Visit | Attending: Orthopedic Surgery

## 2013-10-09 ENCOUNTER — Encounter (HOSPITAL_COMMUNITY): Payer: Self-pay | Admitting: Vascular Surgery

## 2013-10-09 ENCOUNTER — Ambulatory Visit (HOSPITAL_COMMUNITY): Payer: Self-pay | Admitting: Vascular Surgery

## 2013-10-09 ENCOUNTER — Ambulatory Visit (HOSPITAL_COMMUNITY): Payer: Self-pay

## 2013-10-09 ENCOUNTER — Encounter (HOSPITAL_COMMUNITY): Payer: Self-pay | Admitting: *Deleted

## 2013-10-09 DIAGNOSIS — Z9089 Acquired absence of other organs: Secondary | ICD-10-CM | POA: Insufficient documentation

## 2013-10-09 DIAGNOSIS — F329 Major depressive disorder, single episode, unspecified: Secondary | ICD-10-CM | POA: Insufficient documentation

## 2013-10-09 DIAGNOSIS — Z7982 Long term (current) use of aspirin: Secondary | ICD-10-CM | POA: Insufficient documentation

## 2013-10-09 DIAGNOSIS — E119 Type 2 diabetes mellitus without complications: Secondary | ICD-10-CM | POA: Insufficient documentation

## 2013-10-09 DIAGNOSIS — J45909 Unspecified asthma, uncomplicated: Secondary | ICD-10-CM | POA: Insufficient documentation

## 2013-10-09 DIAGNOSIS — F411 Generalized anxiety disorder: Secondary | ICD-10-CM | POA: Insufficient documentation

## 2013-10-09 DIAGNOSIS — S82843A Displaced bimalleolar fracture of unspecified lower leg, initial encounter for closed fracture: Secondary | ICD-10-CM | POA: Insufficient documentation

## 2013-10-09 DIAGNOSIS — Z794 Long term (current) use of insulin: Secondary | ICD-10-CM | POA: Insufficient documentation

## 2013-10-09 DIAGNOSIS — Z880 Allergy status to penicillin: Secondary | ICD-10-CM | POA: Insufficient documentation

## 2013-10-09 DIAGNOSIS — K219 Gastro-esophageal reflux disease without esophagitis: Secondary | ICD-10-CM | POA: Insufficient documentation

## 2013-10-09 DIAGNOSIS — S82841D Displaced bimalleolar fracture of right lower leg, subsequent encounter for closed fracture with routine healing: Secondary | ICD-10-CM

## 2013-10-09 DIAGNOSIS — Z8673 Personal history of transient ischemic attack (TIA), and cerebral infarction without residual deficits: Secondary | ICD-10-CM | POA: Insufficient documentation

## 2013-10-09 DIAGNOSIS — I10 Essential (primary) hypertension: Secondary | ICD-10-CM | POA: Insufficient documentation

## 2013-10-09 DIAGNOSIS — Z79899 Other long term (current) drug therapy: Secondary | ICD-10-CM | POA: Insufficient documentation

## 2013-10-09 DIAGNOSIS — W19XXXA Unspecified fall, initial encounter: Secondary | ICD-10-CM | POA: Insufficient documentation

## 2013-10-09 DIAGNOSIS — F3289 Other specified depressive episodes: Secondary | ICD-10-CM | POA: Insufficient documentation

## 2013-10-09 DIAGNOSIS — Z6834 Body mass index (BMI) 34.0-34.9, adult: Secondary | ICD-10-CM | POA: Insufficient documentation

## 2013-10-09 HISTORY — PX: ORIF ANKLE FRACTURE: SHX5408

## 2013-10-09 HISTORY — DX: Unspecified osteoarthritis, unspecified site: M19.90

## 2013-10-09 HISTORY — DX: Failed or difficult intubation, initial encounter: T88.4XXA

## 2013-10-09 HISTORY — DX: Restless legs syndrome: G25.81

## 2013-10-09 HISTORY — DX: Other sleep disorders: G47.8

## 2013-10-09 LAB — CBC
HCT: 39.2 % (ref 36.0–46.0)
Hemoglobin: 12.9 g/dL (ref 12.0–15.0)
MCH: 29.7 pg (ref 26.0–34.0)
MCHC: 32.9 g/dL (ref 30.0–36.0)
MCV: 90.1 fL (ref 78.0–100.0)
Platelets: 312 10*3/uL (ref 150–400)
RBC: 4.35 MIL/uL (ref 3.87–5.11)
RDW: 13.8 % (ref 11.5–15.5)
WBC: 8.3 10*3/uL (ref 4.0–10.5)

## 2013-10-09 LAB — GLUCOSE, CAPILLARY
GLUCOSE-CAPILLARY: 152 mg/dL — AB (ref 70–99)
GLUCOSE-CAPILLARY: 82 mg/dL (ref 70–99)
Glucose-Capillary: 345 mg/dL — ABNORMAL HIGH (ref 70–99)
Glucose-Capillary: 244 mg/dL — ABNORMAL HIGH (ref 70–99)

## 2013-10-09 LAB — HCG, SERUM, QUALITATIVE: PREG SERUM: NEGATIVE

## 2013-10-09 SURGERY — OPEN REDUCTION INTERNAL FIXATION (ORIF) ANKLE FRACTURE
Anesthesia: Regional | Laterality: Right

## 2013-10-09 MED ORDER — OXYCODONE HCL 5 MG PO TABS
ORAL_TABLET | ORAL | Status: AC
  Administered 2013-10-09: 5 mg via ORAL
  Filled 2013-10-09: qty 1

## 2013-10-09 MED ORDER — OXYCODONE-ACETAMINOPHEN 5-325 MG PO TABS
1.0000 | ORAL_TABLET | Freq: Once | ORAL | Status: AC
  Administered 2013-10-09: 1 via ORAL

## 2013-10-09 MED ORDER — CEFAZOLIN SODIUM-DEXTROSE 2-3 GM-% IV SOLR
INTRAVENOUS | Status: AC
  Filled 2013-10-09: qty 50

## 2013-10-09 MED ORDER — ARTIFICIAL TEARS OP OINT
TOPICAL_OINTMENT | OPHTHALMIC | Status: AC
Start: 2013-10-09 — End: 2013-10-09
  Filled 2013-10-09: qty 3.5

## 2013-10-09 MED ORDER — ONDANSETRON HCL 4 MG/2ML IJ SOLN
INTRAMUSCULAR | Status: DC | PRN
  Administered 2013-10-09: 4 mg via INTRAVENOUS

## 2013-10-09 MED ORDER — SUCCINYLCHOLINE CHLORIDE 20 MG/ML IJ SOLN
INTRAMUSCULAR | Status: DC | PRN
  Administered 2013-10-09: 80 mg via INTRAVENOUS

## 2013-10-09 MED ORDER — ACETAMINOPHEN 160 MG/5ML PO SOLN: 325.0000 mg | ORAL | Status: DC | PRN

## 2013-10-09 MED ORDER — LIDOCAINE HCL (CARDIAC) 20 MG/ML IV SOLN
INTRAVENOUS | Status: DC | PRN
  Administered 2013-10-09: 60 mg via INTRAVENOUS

## 2013-10-09 MED ORDER — BUPIVACAINE HCL (PF) 0.25 % IJ SOLN
INTRAMUSCULAR | Status: AC
  Filled 2013-10-09: qty 30

## 2013-10-09 MED ORDER — OXYCODONE HCL 5 MG PO TABS
5.0000 mg | ORAL_TABLET | Freq: Once | ORAL | Status: AC | PRN
  Administered 2013-10-09: 5 mg via ORAL

## 2013-10-09 MED ORDER — PROPOFOL 10 MG/ML IV BOLUS
INTRAVENOUS | Status: DC | PRN
Start: 2013-10-09 — End: 2013-10-09
  Administered 2013-10-09: 20 mg via INTRAVENOUS
  Administered 2013-10-09: 40 mg via INTRAVENOUS
  Administered 2013-10-09: 140 mg via INTRAVENOUS

## 2013-10-09 MED ORDER — ONDANSETRON HCL 4 MG/2ML IJ SOLN
INTRAMUSCULAR | Status: AC
  Filled 2013-10-09: qty 2

## 2013-10-09 MED ORDER — INSULIN ASPART 100 UNIT/ML ~~LOC~~ SOLN
15.0000 [IU] | Freq: Once | SUBCUTANEOUS | Status: AC
  Administered 2013-10-09: 15 [IU] via SUBCUTANEOUS

## 2013-10-09 MED ORDER — MIDAZOLAM HCL 2 MG/2ML IJ SOLN
INTRAMUSCULAR | Status: AC
  Filled 2013-10-09: qty 2

## 2013-10-09 MED ORDER — 0.9 % SODIUM CHLORIDE (POUR BTL) OPTIME
TOPICAL | Status: DC | PRN
  Administered 2013-10-09: 1000 mL

## 2013-10-09 MED ORDER — KETOROLAC TROMETHAMINE 30 MG/ML IJ SOLN
15.0000 mg | Freq: Once | INTRAMUSCULAR | Status: AC | PRN
  Administered 2013-10-09: 30 mg via INTRAVENOUS

## 2013-10-09 MED ORDER — SUCCINYLCHOLINE CHLORIDE 20 MG/ML IJ SOLN
INTRAMUSCULAR | Status: AC
  Filled 2013-10-09: qty 1

## 2013-10-09 MED ORDER — MIDAZOLAM HCL 2 MG/2ML IJ SOLN
INTRAMUSCULAR | Status: AC
  Administered 2013-10-09: 2 mg via INTRAVENOUS
  Filled 2013-10-09: qty 2

## 2013-10-09 MED ORDER — OXYCODONE-ACETAMINOPHEN 5-325 MG PO TABS
ORAL_TABLET | ORAL | Status: DC
Start: 2013-10-09 — End: 2013-10-09
  Filled 2013-10-09: qty 1

## 2013-10-09 MED ORDER — FENTANYL CITRATE 0.05 MG/ML IJ SOLN
INTRAMUSCULAR | Status: AC
  Administered 2013-10-09: 100 ug via INTRAVENOUS
  Filled 2013-10-09: qty 2

## 2013-10-09 MED ORDER — KETOROLAC TROMETHAMINE 30 MG/ML IJ SOLN
INTRAMUSCULAR | Status: DC
Start: 2013-10-09 — End: 2013-10-09
  Filled 2013-10-09: qty 1

## 2013-10-09 MED ORDER — BUPIVACAINE-EPINEPHRINE (PF) 0.5% -1:200000 IJ SOLN
INTRAMUSCULAR | Status: DC | PRN
Start: 2013-10-09 — End: 2013-10-09
  Administered 2013-10-09: 10 mL via PERINEURAL

## 2013-10-09 MED ORDER — LACTATED RINGERS IV SOLN
INTRAVENOUS | Status: DC
  Administered 2013-10-09 (×2): via INTRAVENOUS

## 2013-10-09 MED ORDER — CEFAZOLIN SODIUM-DEXTROSE 2-3 GM-% IV SOLR
2.0000 g | Freq: Once | INTRAVENOUS | Status: AC
  Administered 2013-10-09: 2 g via INTRAVENOUS

## 2013-10-09 MED ORDER — MIDAZOLAM HCL 5 MG/5ML IJ SOLN
INTRAMUSCULAR | Status: DC | PRN
Start: 2013-10-09 — End: 2013-10-09
  Administered 2013-10-09: 2 mg via INTRAVENOUS

## 2013-10-09 MED ORDER — OXYCODONE-ACETAMINOPHEN 5-325 MG PO TABS
ORAL_TABLET | ORAL | Status: AC
  Administered 2013-10-09: 1 via ORAL
  Filled 2013-10-09: qty 1

## 2013-10-09 MED ORDER — MIDAZOLAM HCL 2 MG/2ML IJ SOLN
1.0000 mg | INTRAMUSCULAR | Status: DC | PRN
  Administered 2013-10-09: 2 mg via INTRAVENOUS

## 2013-10-09 MED ORDER — OXYCODONE HCL 5 MG/5ML PO SOLN: 5.0000 mg | Freq: Once | ORAL | Status: AC | PRN

## 2013-10-09 MED ORDER — HYDROMORPHONE HCL PF 1 MG/ML IJ SOLN
INTRAMUSCULAR | Status: AC
  Filled 2013-10-09: qty 1

## 2013-10-09 MED ORDER — FENTANYL CITRATE 0.05 MG/ML IJ SOLN
50.0000 ug | INTRAMUSCULAR | Status: DC | PRN
  Administered 2013-10-09: 100 ug via INTRAVENOUS

## 2013-10-09 MED ORDER — FENTANYL CITRATE 0.05 MG/ML IJ SOLN
INTRAMUSCULAR | Status: AC
  Filled 2013-10-09: qty 5

## 2013-10-09 MED ORDER — OXYCODONE-ACETAMINOPHEN 5-325 MG PO TABS: 1.0000 | ORAL_TABLET | ORAL | Status: DC | PRN

## 2013-10-09 MED ORDER — SCOPOLAMINE 1 MG/3DAYS TD PT72
MEDICATED_PATCH | TRANSDERMAL | Status: AC
  Administered 2013-10-09: 1 via TRANSDERMAL
  Filled 2013-10-09: qty 1

## 2013-10-09 MED ORDER — ARTIFICIAL TEARS OP OINT
TOPICAL_OINTMENT | OPHTHALMIC | Status: DC | PRN
  Administered 2013-10-09: 1 via OPHTHALMIC

## 2013-10-09 MED ORDER — HYDROMORPHONE HCL PF 1 MG/ML IJ SOLN
INTRAMUSCULAR | Status: AC
  Administered 2013-10-09: 0.5 mg via INTRAVENOUS
  Filled 2013-10-09: qty 1

## 2013-10-09 MED ORDER — ROPIVACAINE HCL 5 MG/ML IJ SOLN
INTRAMUSCULAR | Status: DC | PRN
  Administered 2013-10-09: 30 mL via PERINEURAL

## 2013-10-09 MED ORDER — ACETAMINOPHEN 325 MG PO TABS: 325.0000 mg | ORAL_TABLET | ORAL | Status: DC | PRN

## 2013-10-09 MED ORDER — HYDROMORPHONE HCL PF 1 MG/ML IJ SOLN
0.2500 mg | INTRAMUSCULAR | Status: DC | PRN
  Administered 2013-10-09 (×4): 0.5 mg via INTRAVENOUS

## 2013-10-09 MED ORDER — INSULIN ASPART 100 UNIT/ML ~~LOC~~ SOLN
SUBCUTANEOUS | Status: AC
  Administered 2013-10-09: 15 [IU] via SUBCUTANEOUS
  Filled 2013-10-09: qty 1

## 2013-10-09 MED ORDER — DOCUSATE SODIUM 100 MG PO CAPS: 100.0000 mg | ORAL_CAPSULE | Freq: Three times a day (TID) | ORAL | Status: DC | PRN

## 2013-10-09 SURGICAL SUPPLY — 64 items
BANDAGE ELASTIC 4 VELCRO ST LF (GAUZE/BANDAGES/DRESSINGS) ×2 IMPLANT
BANDAGE ELASTIC 6 VELCRO ST LF (GAUZE/BANDAGES/DRESSINGS) ×3 IMPLANT
BANDAGE ESMARK 6X9 LF (GAUZE/BANDAGES/DRESSINGS) ×1 IMPLANT
BIT DRILL CANN 2.7X625 NONSTRL (BIT) ×2 IMPLANT
BLADE SURG 10 STRL SS (BLADE) ×3 IMPLANT
BLADE SURG ROTATE 9660 (MISCELLANEOUS) IMPLANT
BNDG CMPR 9X6 STRL LF SNTH (GAUZE/BANDAGES/DRESSINGS) ×1
BNDG COHESIVE 4X5 TAN STRL (GAUZE/BANDAGES/DRESSINGS) ×3 IMPLANT
BNDG ESMARK 6X9 LF (GAUZE/BANDAGES/DRESSINGS) ×3
COVER MAYO STAND STRL (DRAPES) ×3 IMPLANT
COVER SURGICAL LIGHT HANDLE (MISCELLANEOUS) ×3 IMPLANT
CUFF TOURNIQUET SINGLE 34IN LL (TOURNIQUET CUFF) ×3 IMPLANT
DRAPE C-ARM 42X72 X-RAY (DRAPES) ×2 IMPLANT
DRAPE U-SHAPE 47X51 STRL (DRAPES) ×3 IMPLANT
DURAPREP 26ML APPLICATOR (WOUND CARE) ×3 IMPLANT
ELECT REM PT RETURN 9FT ADLT (ELECTROSURGICAL) ×3
ELECTRODE REM PT RTRN 9FT ADLT (ELECTROSURGICAL) ×1 IMPLANT
GAUZE SPONGE 4X4 12PLY STRL (GAUZE/BANDAGES/DRESSINGS) ×3 IMPLANT
GAUZE XEROFORM 1X8 LF (GAUZE/BANDAGES/DRESSINGS) ×3 IMPLANT
GLOVE BIO SURGEON STRL SZ7 (GLOVE) ×3 IMPLANT
GLOVE BIO SURGEON STRL SZ7.5 (GLOVE) ×3 IMPLANT
GLOVE BIOGEL PI IND STRL 6.5 (GLOVE) IMPLANT
GLOVE BIOGEL PI IND STRL 8 (GLOVE) ×1 IMPLANT
GLOVE BIOGEL PI INDICATOR 6.5 (GLOVE) ×2
GLOVE BIOGEL PI INDICATOR 8 (GLOVE) ×2
GOWN STRL REUS W/ TWL LRG LVL3 (GOWN DISPOSABLE) ×3 IMPLANT
GOWN STRL REUS W/ TWL XL LVL3 (GOWN DISPOSABLE) ×1 IMPLANT
GOWN STRL REUS W/TWL LRG LVL3 (GOWN DISPOSABLE) ×6
GOWN STRL REUS W/TWL XL LVL3 (GOWN DISPOSABLE) ×3
GUIDEWIRE THREADED 150MM (WIRE) ×4 IMPLANT
KIT BASIN OR (CUSTOM PROCEDURE TRAY) ×3 IMPLANT
KIT ROOM TURNOVER OR (KITS) ×3 IMPLANT
NEEDLE 22X1 1/2 (OR ONLY) (NEEDLE) ×2 IMPLANT
NS IRRIG 1000ML POUR BTL (IV SOLUTION) ×3 IMPLANT
PACK ORTHO EXTREMITY (CUSTOM PROCEDURE TRAY) ×3 IMPLANT
PAD ARMBOARD 7.5X6 YLW CONV (MISCELLANEOUS) ×6 IMPLANT
PAD CAST 4YDX4 CTTN HI CHSV (CAST SUPPLIES) ×1 IMPLANT
PADDING CAST COTTON 4X4 STRL (CAST SUPPLIES) ×3
SCREW CANC FT 4.0X20 (Screw) ×2 IMPLANT
SCREW CANC FT 4.0X22 (Screw) ×2 IMPLANT
SCREW CANC FT/18 4.0 (Screw) ×2 IMPLANT
SCREW CORTEX 3.5 12MM (Screw) ×2 IMPLANT
SCREW CORTEX 3.5 14MM (Screw) ×4 IMPLANT
SCREW CORTEX 3.5 24MM (Screw) ×2 IMPLANT
SCREW LOCK CORT ST 3.5X12 (Screw) IMPLANT
SCREW LOCK CORT ST 3.5X14 (Screw) IMPLANT
SCREW LOCK CORT ST 3.5X24 (Screw) IMPLANT
SCREW SHORT THREAD 4.0X40 (Screw) ×4 IMPLANT
SPLINT PLASTER CAST XFAST 5X30 (CAST SUPPLIES) IMPLANT
SPLINT PLASTER XFAST SET 5X30 (CAST SUPPLIES) ×2
SPONGE GAUZE 4X4 12PLY STER LF (GAUZE/BANDAGES/DRESSINGS) ×2 IMPLANT
SPONGE LAP 4X18 X RAY DECT (DISPOSABLE) ×4 IMPLANT
STAPLER VISISTAT 35W (STAPLE) IMPLANT
SUCTION FRAZIER TIP 10 FR DISP (SUCTIONS) ×3 IMPLANT
SUT ETHILON 3 0 FSL (SUTURE) ×2 IMPLANT
SUT ETHILON 4 0 PS 2 18 (SUTURE) IMPLANT
SUT VIC AB 0 CTB1 27 (SUTURE) IMPLANT
SUT VIC AB 2-0 FS1 27 (SUTURE) ×2 IMPLANT
SYR CONTROL 10ML LL (SYRINGE) ×2 IMPLANT
TOWEL OR 17X24 6PK STRL BLUE (TOWEL DISPOSABLE) ×3 IMPLANT
TOWEL OR 17X26 10 PK STRL BLUE (TOWEL DISPOSABLE) ×3 IMPLANT
TUBE CONNECTING 12'X1/4 (SUCTIONS) ×1
TUBE CONNECTING 12X1/4 (SUCTIONS) ×2 IMPLANT
WATER STERILE IRR 1000ML POUR (IV SOLUTION) ×1 IMPLANT

## 2013-10-09 NOTE — H&P (Signed)
Debra [name] is an 48 y.o. female.   Chief Complaint: R ankle fx HPI: s/p fall with R bimal ankle fx  Past Medical History  Diagnosis Date  . Allergy     seasonal  . Heart murmur     Birth  . Hyperlipidemia     2005  . Stroke     in 1990  . Neuromuscular disorder     Neuropathy - hands/feet  . Anxiety     1990  . Anemia     2005  . Asthma     2000  . Diabetes mellitus     type i juvenille; 1988  . Hypertension     1998  . Mental disorder     Depression 1990  . Anal fissure   . Hx of adenomatous colonic polyps   . Internal hemorrhoids 04/24/06    on colonoscopy  . Migraine   . Cataract   . GERD (gastroesophageal reflux disease)   . Staphylococcus aureus infection     toe  . Family history of malignant neoplasm of gastrointestinal tract   . Anginal pain   . Depression 10/16/2012  . PONV (postoperative nausea and vomiting)   . Difficult intubation     narrow airway  . Arthritis   . Sleep paralysis   . RLS (restless legs syndrome)     Past Surgical History  Procedure Laterality Date  . Cholecystectomy    . Trigger finger release      x 3  . Eye surgery    . Cesarean section    . Bilateral foot surgery    . Laparoscopic tubal ligation  12/29/2010    Procedure: LAPAROSCOPIC TUBAL LIGATION;  Surgeon: Tereso Newcomer, MD;  Location: [facility];  Service: Gynecology;  Laterality: Bilateral;  . Iud removal  12/29/2010    Procedure: INTRAUTERINE DEVICE (IUD) REMOVAL;  Surgeon: Tereso Newcomer, MD;  Location: [facility];  Service: Gynecology;  Laterality: N/A;  . Hysteroscopy  12/29/2010    Procedure: HYSTEROSCOPY WITH HYDROTHERMAL ABLATION;  Surgeon: Tereso Newcomer, MD;  Location: [facility];  Service: Gynecology;;  . Dilation and curettage of uterus  12/29/2010    Procedure: DILATATION AND CURETTAGE (D&C);  Surgeon: Tereso Newcomer, MD;  Location: [facility];  Service: Gynecology;;  . Endometrial ablation w/ novasure      Nov 2011  . Cardiac catheterization      around 2007  or 2008  . Breast surgery      biopsy left breast  . Colonoscopy w/ biopsies and polypectomy      Family History  Problem Relation Age of Onset  . Hypertension Mother   . Kidney disease Mother   . Hypertension Father   . Breast cancer Maternal Grandmother   . Prostate cancer Maternal Grandfather   . Ovarian cancer Paternal Grandmother   . Prostate cancer Paternal Grandfather   . Colon cancer Maternal Uncle    Social History:  reports that she has never smoked. She has never used smokeless tobacco. She reports that she does not drink alcohol or use illicit drugs.  Allergies:  Allergies  Allergen Reactions  . Penicillins Nausea And Vomiting and Rash    Medications Prior to Admission  Medication Sig Dispense Refill  . albuterol (PROVENTIL HFA;VENTOLIN HFA) 108 (90 BASE) MCG/ACT inhaler Inhale 1-2 puffs into the lungs every 6 (six) hours as needed for wheezing or shortness of breath.      Marland Kitchen amLODipine (NORVASC) 10 MG tablet Take  10 mg by mouth daily.      . carvedilol (COREG) 3.125 MG tablet Take 3.125 mg by mouth 2 (two) times daily with a meal.      . cloNIDine (CATAPRES) 0.3 MG tablet Take 0.3 mg by mouth 2 (two) times daily.      . insulin aspart (NOVOLOG) 100 UNIT/ML injection Inject 20-36 Units into the skin 3 (three) times daily before meals. Per sliding scale      . insulin glargine (LANTUS) 100 UNIT/ML injection Inject 33 Units into the skin 2 (two) times daily.      Marland Kitchen. olopatadine (PATANOL) 0.1 % ophthalmic solution Place 1 drop into both eyes 2 (two) times daily as needed for allergies.       Marland Kitchen. oxyCODONE-acetaminophen (PERCOCET/ROXICET) 5-325 MG per tablet Take 1-2 tablets by mouth every 6 (six) hours as needed for moderate pain or severe pain.  6 tablet  0  . oxymetazoline (AFRIN) 0.05 % nasal spray Place 3 sprays into the nose 2 (two) times daily.        Marland Kitchen. spironolactone (ALDACTONE) 25 MG tablet Take 1 tablet (25 mg total) by mouth daily.  90 tablet  3  . aspirin EC 81 MG  tablet Take 325 mg by mouth daily.       . colesevelam (WELCHOL) 625 MG tablet Take 1,250 mg by mouth 2 (two) times daily.      . DULoxetine (CYMBALTA) 60 MG capsule Take 60 mg by mouth daily.      . isosorbide mononitrate (IMDUR) 30 MG 24 hr tablet Take 30 mg by mouth daily as needed (chest pain, pressure).       . nitroGLYCERIN (NITROSTAT) 0.4 MG SL tablet Place 0.4 mg under the tongue every 5 (five) minutes as needed for chest pain.        Results for orders placed during the hospital encounter of 10/09/13 (from the past 48 hour(s))  CBC     Status: None   Collection Time    10/09/13  7:58 AM      Result Value Ref Range   WBC 8.3  4.0 - 10.5 K/uL   RBC 4.35  3.87 - 5.11 MIL/uL   Hemoglobin 12.9  12.0 - 15.0 g/dL   HCT 56.239.2  13.036.0 - 86.546.0 %   MCV 90.1  78.0 - 100.0 fL   MCH 29.7  26.0 - 34.0 pg   MCHC 32.9  30.0 - 36.0 g/dL   RDW 78.413.8  69.611.5 - 29.515.5 %   Platelets 312  150 - 400 K/uL  HCG, SERUM, QUALITATIVE     Status: None   Collection Time    10/09/13  7:58 AM      Result Value Ref Range   Preg, Serum NEGATIVE  NEGATIVE   Comment:            THE SENSITIVITY OF THIS     METHODOLOGY IS >10 mIU/mL.  GLUCOSE, CAPILLARY     Status: Abnormal   Collection Time    10/09/13  8:00 AM      Result Value Ref Range   Glucose-Capillary 345 (*) 70 - 99 mg/dL   Dg Chest 2 View  2/84/13248/13/2015   CLINICAL DATA:  Preoperative ankle surgery ; hypertension  EXAM: CHEST  2 VIEW  COMPARISON:  Chest radiograph and chest CT Jul 14, 2011  FINDINGS: Lungs are clear. Heart is borderline enlarged with pulmonary vascularity within normal limits. No adenopathy. No bone lesions.  IMPRESSION: Heart borderline enlarged.  No edema or consolidation.   Electronically Signed   By: Bretta Bang M.D.   On: 10/09/2013 08:16    Review of Systems  All other systems reviewed and are negative.   Blood pressure 187/80, pulse 80, temperature 98.2 F (36.8 C), temperature source Oral, resp. rate 18, height 5\' 4"  (1.626  m), weight 91.627 kg (202 lb), SpO2 98.00%. Physical Exam  Constitutional: She is oriented to person, place, and time. She appears well-developed and well-nourished.  HENT:  Head: Atraumatic.  Eyes: EOM are normal.  Cardiovascular: Intact distal pulses.   Respiratory: Effort normal.  Musculoskeletal:  R ankle splinted, Toes warm, NVID.  Neurological: She is alert and oriented to person, place, and time.  Skin: Skin is warm and dry.  Psychiatric: She has a normal mood and affect.     Assessment/Plan R bimal ankle fx Plan ORIF Risks / benefits of surgery discussed Consent on chart  NPO for OR Preop antibiotics   Shondrea Steinert WILLIAM 10/09/2013, 9:30 AM

## 2013-10-09 NOTE — Progress Notes (Signed)
Raynelle Fanning, CRNA at bedside. Pt to OR.

## 2013-10-09 NOTE — Progress Notes (Signed)
Dr. Maple Hudson called and informed of elevated CBG of 345 and pt's last dose of Lantus (25 units) at 0630 this morning, to come see pt.

## 2013-10-09 NOTE — Op Note (Signed)
Procedure(s): Open reduction internal fixation right ankle Procedure Note  Debra [name] female 48 y.o. 10/09/2013  Procedure(s) and Anesthesia Type:    * Open reduction internal fixation right ankle - Choice  Surgeon(s) and Role:    * Mable Paris, MD - Primary   Indications:  48 y.o. female s/p fall with right ankle fracture. Indicated for surgery to promote anatomic restoration of joint.     Surgeon: Mable Paris   Assistants: Damita Lack PA-C (Danielle was present throughout the procedure and was essential for retraction and positioning and closure)  Anesthesia: General endotracheal anesthesia With preoperative interscalene block    Procedure Detail  Open reduction internal fixation right ankle  Findings: Anatomic reduction with a one third tubular plate laterally into 4-0 cannulated screws medially.  Estimated Blood Loss:  Minimal         Drains: none  Blood Given: none         Specimens: none        Complications:  * No complications entered in OR log *         Disposition: PACU - hemodynamically stable.         Condition: stable    Procedure:  The patient was identified in the preoperative  holding area where I personally marked the operative site after  verifying site side and procedure with the patient. The patient was taken back  to the operating room where general anesthesia was induced without  Complication. The patient was placed in supine position with a bump under the operative hip. A non sterile tourniquet was applied to the thigh. The patient did receive IV antibiotics prior to the incision.   After the appropriate time-out, the limb was exsanguinated and the tourniquet was elevated to 300 mmHg.   A lateral incision was made over the fracture site and dissection was carried down the lateral fibula.  The fracture was a exposed and cleaned of hematoma.  Reduction was carried out using reduction forceps and manipulation  of the ankle.  An interfragmentary lag screw was placedWith excellent compression.  A 7 hole plate was laid laterally and felt to be appropriate sized.  Holes proximal and distal to the fracture were sequentially drill, measured and filled with appropriate sized bicortical and unicortical screws.  Appropriate length and alignment were verified on fluoroscopic imaging.    Attention was then turned to the medial side were a approximate 4 cm curvalinear incision was made over the distal medial malleolus.  Dissection was carried down to the fracture and the fracture was exposed. The joint was irrigated.  1 small loose fragment were noted in the joint and was removed.  The fracture was held anatomically reduced with a reduction forceps and two guidewires were passed across the fracture. The reduction and pin position was verified with fluoro and then 2 40 mm partially threaded cancellous screws were placed after over drilling the outer cortex.  Good fixation was noted.  The syndesmosis was stressed and felt to be Intact.  The wounds were then copiously irrigated and closed in layers with 2-0 vicryl in a deep layer and 3-0 nylon medially and Dermabond laterally for skin closure.  Sterile dressings were then applied and well padded well molded splint in a plantigrade position was applied.  The tourniquet was let down for total tourniquet time of Less than one hour.  The patient was then allowed to awaken from GA, taken to the PACU in stable condition.  POSTOPERATIVE PLAN: The  patient will be non-weightbearing on the operative Extremity and will follow up in 10-14 days for wound check.

## 2013-10-09 NOTE — Anesthesia Procedure Notes (Addendum)
Procedure Name: Intubation Date/Time: 10/09/2013 10:13 AM Performed by: Jefm Miles E Pre-anesthesia Checklist: Patient identified, Emergency Drugs available, Suction available, Patient being monitored and Timeout performed Patient Re-evaluated:Patient Re-evaluated prior to inductionOxygen Delivery Method: Circle system utilized Preoxygenation: Pre-oxygenation with 100% oxygen Intubation Type: IV induction and Rapid sequence Ventilation: Mask ventilation without difficulty Laryngoscope Size: Mac and 3 Grade View: Grade I Tube type: Oral Tube size: 7.0 mm Number of attempts: 1 Airway Equipment and Method: Stylet Placement Confirmation: ETT inserted through vocal cords under direct vision,  positive ETCO2 and breath sounds checked- equal and bilateral Secured at: 21 cm Tube secured with: Tape Dental Injury: Teeth and Oropharynx as per pre-operative assessment  Comments: Atraumatic intubation with Grade I view.    Anesthesia Regional Block:  Popliteal block  Pre-Anesthetic Checklist: ,, timeout performed, Correct Patient, Correct Site, Correct Laterality, Correct Procedure, Correct Position, site marked, Risks and benefits discussed,  Surgical consent,  Pre-op evaluation,  At surgeon's request and post-op pain management  Laterality: Lower and Right  Prep: chloraprep       Needles:  Injection technique: Single-shot  Needle Type: Echogenic Needle          Additional Needles:  Procedures: ultrasound guided (picture in chart) and nerve stimulator Popliteal block  Nerve Stimulator or Paresthesia:  Response: plantarflexion, 0.5 mA,   Additional Responses:   Narrative:  Injection made incrementally with aspirations every 5 mL.  Performed by: Personally  Anesthesiologist: Moser  Additional Notes: H+P and labs reviewed, risks and benefits discussed with patient, procedure tolerated well without complications. Saphenous nerve block (right) with US guidance performed with  sterile prep and no complications

## 2013-10-09 NOTE — Discharge Instructions (Signed)
Cast or Splint Care °Casts and splints support injured limbs and keep bones from moving while they heal. It is important to care for your cast or splint at home.   °HOME CARE INSTRUCTIONS °· Keep the cast or splint uncovered during the drying period. It can take 24 to 48 hours to dry if it is made of plaster. A fiberglass cast will dry in less than 1 hour. °· Do not rest the cast on anything harder than a pillow for the first 24 hours. °· Do not put weight on your injured limb or apply pressure to the cast until your health care provider gives you permission. °· Keep the cast or splint dry. Wet casts or splints can lose their shape and may not support the limb as well. A wet cast that has lost its shape can also create harmful pressure on your skin when it dries. Also, wet skin can become infected. °¨ Cover the cast or splint with a plastic bag when bathing or when out in the rain or snow. If the cast is on the trunk of the body, take sponge baths until the cast is removed. °¨ If your cast does become wet, dry it with a towel or a blow dryer on the cool setting only. °· Keep your cast or splint clean. Soiled casts may be wiped with a moistened cloth. °· Do not place any hard or soft foreign objects under your cast or splint, such as cotton, toilet paper, lotion, or powder. °· Do not try to scratch the skin under the cast with any object. The object could get stuck inside the cast. Also, scratching could lead to an infection. If itching is a problem, use a blow dryer on a cool setting to relieve discomfort. °· Do not trim or cut your cast or remove padding from inside of it. °· Exercise all joints next to the injury that are not immobilized by the cast or splint. For example, if you have a long leg cast, exercise the hip joint and toes. If you have an arm cast or splint, exercise the shoulder, elbow, thumb, and fingers. °· Elevate your injured arm or leg on 1 or 2 pillows for the first 1 to 3 days to decrease  swelling and pain. It is best if you can comfortably elevate your cast so it is higher than your heart. °SEEK MEDICAL CARE IF:  °· Your cast or splint cracks. °· Your cast or splint is too tight or too loose. °· You have unbearable itching inside the cast. °· Your cast becomes wet or develops a soft spot or area. °· You have a bad smell coming from inside your cast. °· You get an object stuck under your cast. °· Your skin around the cast becomes red or raw. °· You have new pain or worsening pain after the cast has been applied. °SEEK IMMEDIATE MEDICAL CARE IF:  °· You have fluid leaking through the cast. °· You are unable to move your fingers or toes. °· You have discolored (blue or white), cool, painful, or very swollen fingers or toes beyond the cast. °· You have tingling or numbness around the injured area. °· You have severe pain or pressure under the cast. °· You have any difficulty with your breathing or have shortness of breath. °· You have chest pain. °Document Released: 02/11/2000 Document Revised: 12/04/2012 Document Reviewed: 08/22/2012 °[employer]® Patient Information ©2015 [employer]. This information is not intended to replace advice given to you by your health care   provider. Make sure you discuss any questions you have with your health care provider.Discharge Instructions after    Pain medicine has been prescribed for you.  Use your medicine liberally over the first 48 hours, and then you can begin to taper your use. You may take Extra Strength Tylenol or Tylenol only in place of the pain pills. DO NOT take ANY nonsteroidal anti-inflammatory pain medications: Advil, Motrin, Ibuprofen, Aleve, Naproxen, or Narprosyn.  Take one aspirin a day for 2 weeks after surgery, unless you have an aspirin sensitivity/ allergy or asthma. Keep the splint dry and intact until your first visit with Dr. Ave Filter after surgery Elevate leg often and wiggle toes   Please call [phone] during normal business  hours or [phone] after hours for any problems. Including the following:  - excessive redness of the incisions - drainage for more than 4 days - fever of more than 101.5 F  *Please note that pain medications will not be refilled after hours or on weekends.    What to eat:  For your first meals, you should eat lightly; only small meals initially.  If you do not have nausea, you may eat larger meals.  Avoid spicy, greasy and heavy food.    General Anesthesia, Adult, Care After  Refer to this sheet in the next few weeks. These instructions provide you with information on caring for yourself after your procedure. Your health care provider may also give you more specific instructions. Your treatment has been planned according to current medical practices, but problems sometimes occur. Call your health care provider if you have any problems or questions after your procedure.  WHAT TO EXPECT AFTER THE PROCEDURE  After the procedure, it is typical to experience:  Sleepiness.  Nausea and vomiting. HOME CARE INSTRUCTIONS  For the first 24 hours after general anesthesia:  Have a responsible person with you.  Do not drive a car. If you are alone, do not take public transportation.  Do not drink alcohol.  Do not take medicine that has not been prescribed by your health care provider.  Do not sign important papers or make important decisions.  You may resume a normal diet and activities as directed by your health care provider.  Change bandages (dressings) as directed.  If you have questions or problems that seem related to general anesthesia, call the hospital and ask for the anesthetist or anesthesiologist on call. SEEK MEDICAL CARE IF:  You have nausea and vomiting that continue the day after anesthesia.  You develop a rash. SEEK IMMEDIATE MEDICAL CARE IF:  You have difficulty breathing.  You have chest pain.  You have any allergic problems. Document Released: 05/22/2000 Document  Revised: 10/16/2012 Document Reviewed: 08/29/2012  [facility] Patient Information 2014 [place], [place].

## 2013-10-09 NOTE — Anesthesia Preprocedure Evaluation (Addendum)
Anesthesia Evaluation  Patient identified by MRN, date of birth, ID band Patient awake    Reviewed: Allergy & Precautions, H&P , NPO status , Patient's Chart, lab work & pertinent test results  History of Anesthesia Complications (+) PONV, DIFFICULT AIRWAY and history of anesthetic complications  Airway Mallampati: II TM Distance: >3 FB Neck ROM: Full    Dental  (+) Lower Dentures, Upper Dentures, Dental Advisory Given   Pulmonary neg shortness of breath, asthma , neg sleep apnea, neg recent URI,  breath sounds clear to auscultation        Cardiovascular hypertension, Pt. on medications and Pt. on home beta blockers + angina - CAD, - Past MI and - CHF - dysrhythmias - Valvular Problems/MurmursRhythm:Regular     Neuro/Psych  Headaches, PSYCHIATRIC DISORDERS Anxiety Depression  Neuromuscular disease CVA, No Residual Symptoms    GI/Hepatic Neg liver ROS, GERD-  Controlled,  Endo/Other  diabetes, Poorly Controlled, Type 2, Insulin DependentMorbid obesity  Renal/GU negative Renal ROS     Musculoskeletal Right ankle fracture   Abdominal   Peds  Hematology   Anesthesia Other Findings   Reproductive/Obstetrics                         Anesthesia Physical Anesthesia Plan  ASA: III  Anesthesia Plan: General and Regional   Post-op Pain Management:    Induction: Intravenous  Airway Management Planned: Oral ETT  Additional Equipment: None  Intra-op Plan:   Post-operative Plan: Extubation in OR  Informed Consent: I have reviewed the patients History and Physical, chart, labs and discussed the procedure including the risks, benefits and alternatives for the proposed anesthesia with the patient or authorized representative who has indicated his/her understanding and acceptance.     Plan Discussed with: CRNA and Surgeon  Anesthesia Plan Comments:         Anesthesia Quick Evaluation

## 2013-10-09 NOTE — Anesthesia Postprocedure Evaluation (Signed)
  Anesthesia Post-op Note  Patient: Debra [name]  Procedure(s) Performed: Procedure(s) with comments: Open reduction internal fixation right ankle (Right) - Open reduction internal fixation right ankle  Patient Location: PACU  Anesthesia Type:General and Regional  Level of Consciousness: awake and alert   Airway and Oxygen Therapy: Patient Spontanous Breathing  Post-op Pain: mild  Post-op Assessment: Post-op Vital signs reviewed, Patient's Cardiovascular Status Stable, Respiratory Function Stable, Patent Airway, No signs of Nausea or vomiting and Pain level controlled  Post-op Vital Signs: Reviewed and stable  Last Vitals:  Filed Vitals:   10/09/13 1515  BP: 167/84  Pulse: 78  Temp:   Resp:     Complications: No apparent anesthesia complications

## 2013-10-09 NOTE — Transfer of Care (Signed)
Immediate Anesthesia Transfer of Care Note  Patient: Debra [name]  Procedure(s) Performed: Procedure(s) with comments: Open reduction internal fixation right ankle (Right) - Open reduction internal fixation right ankle  Patient Location: PACU  Anesthesia Type:General  Level of Consciousness: lethargic and responds to stimulation  Airway & Oxygen Therapy: Patient Spontanous Breathing and Patient connected to nasal cannula oxygen  Post-op Assessment: Report given to PACU RN  Post vital signs: Reviewed and stable  Complications: No apparent anesthesia complications

## 2013-10-10 ENCOUNTER — Encounter (HOSPITAL_COMMUNITY): Payer: Self-pay | Admitting: Orthopedic Surgery

## 2013-10-23 NOTE — Addendum Note (Signed)
Addended by: Bufford Spikes on: 10/23/2013 01:56 PM   Modules accepted: Orders

## 2013-10-29 ENCOUNTER — Telehealth: Payer: Self-pay | Admitting: *Deleted

## 2013-10-30 ENCOUNTER — Ambulatory Visit (HOSPITAL_COMMUNITY)
Admission: RE | Admit: 2013-10-30 | Discharge: 2013-10-30 | Disposition: A | Discharge status: Home / Self Care | Payer: No Typology Code available for payment source | Source: Ambulatory Visit | Attending: Internal Medicine | Admitting: Internal Medicine

## 2013-10-30 ENCOUNTER — Encounter: Payer: Self-pay | Admitting: Internal Medicine

## 2013-10-30 ENCOUNTER — Ambulatory Visit (INDEPENDENT_AMBULATORY_CARE_PROVIDER_SITE_OTHER): Payer: No Typology Code available for payment source | Admitting: Internal Medicine

## 2013-10-30 VITALS — BP 190/86 | HR 71 | Temp 98.1°F | Ht 64.5 in | Wt 201.6 lb

## 2013-10-30 DIAGNOSIS — L03116 Cellulitis of left lower limb: Secondary | ICD-10-CM

## 2013-10-30 DIAGNOSIS — L02419 Cutaneous abscess of limb, unspecified: Secondary | ICD-10-CM | POA: Insufficient documentation

## 2013-10-30 DIAGNOSIS — L03115 Cellulitis of right lower limb: Secondary | ICD-10-CM

## 2013-10-30 DIAGNOSIS — Z23 Encounter for immunization: Secondary | ICD-10-CM

## 2013-10-30 DIAGNOSIS — I10 Essential (primary) hypertension: Secondary | ICD-10-CM

## 2013-10-30 DIAGNOSIS — M25579 Pain in unspecified ankle and joints of unspecified foot: Secondary | ICD-10-CM | POA: Insufficient documentation

## 2013-10-30 DIAGNOSIS — L03119 Cellulitis of unspecified part of limb: Secondary | ICD-10-CM

## 2013-10-30 LAB — CBC WITH DIFFERENTIAL/PLATELET
Basophils Absolute: 0.1 10*3/uL (ref 0.0–0.1)
Basophils Relative: 1 % (ref 0–1)
Eosinophils Absolute: 0.5 10*3/uL (ref 0.0–0.7)
Eosinophils Relative: 6 % — ABNORMAL HIGH (ref 0–5)
HCT: 38.5 % (ref 36.0–46.0)
Hemoglobin: 12.8 g/dL (ref 12.0–15.0)
Lymphocytes Relative: 39 % (ref 12–46)
Lymphs Abs: 3 10*3/uL (ref 0.7–4.0)
MCH: 28.4 pg (ref 26.0–34.0)
MCHC: 33.2 g/dL (ref 30.0–36.0)
MCV: 85.6 fL (ref 78.0–100.0)
Monocytes Absolute: 0.7 10*3/uL (ref 0.1–1.0)
Monocytes Relative: 9 % (ref 3–12)
NEUTROS PCT: 45 % (ref 43–77)
Neutro Abs: 3.5 10*3/uL (ref 1.7–7.7)
PLATELETS: 337 10*3/uL (ref 150–400)
RBC: 4.5 MIL/uL (ref 3.87–5.11)
RDW: 14.7 % (ref 11.5–15.5)
WBC: 7.7 10*3/uL (ref 4.0–10.5)

## 2013-10-30 MED ORDER — CLINDAMYCIN HCL 300 MG PO CAPS: 300.0000 mg | ORAL_CAPSULE | Freq: Four times a day (QID) | ORAL | Status: DC

## 2013-10-30 MED ORDER — CIPROFLOXACIN HCL 750 MG PO TABS: 750.0000 mg | ORAL_TABLET | Freq: Two times a day (BID) | ORAL | Status: DC

## 2013-10-30 NOTE — Patient Instructions (Signed)
We will be prescribing a new antibiotic for you. When the results of your test come back we will let you know if any changes should be made.  It is important you follow up with your orthopedic doctor a soon as possible. For now we will get xrays of your legs for now.

## 2013-10-30 NOTE — Telephone Encounter (Signed)
Pt called and left message for [facility], she stated she has an area at her surgical site that is draining and she is concerned, she would like a referral to wound care, she is ask to come to office for a visit and referral if found to be needed done at that time. Records from last visit with surgeon obtained

## 2013-10-30 NOTE — Progress Notes (Signed)
Patient ID: Debra [name], female   DOB: [birth year], 48 y.o.   MRN: [id number]   Subjective:   Patient ID: Debra [name] female   DOB: [birth year] 48 y.o.   MRN: [id number]  HPI: [name] is a 48 y.o. with PMH of DM- uncontrolled, HTN, Asthma, HLD, Depression. Presented today for follow up of her ankle fractures, after having ORIF.  See problem based charting for more details.  Past Medical History  Diagnosis Date  . Allergy     seasonal  . Heart murmur     Birth  . Hyperlipidemia     2005  . Stroke     in 1990  . Neuromuscular disorder     Neuropathy - hands/feet  . Anxiety     1990  . Anemia     2005  . Asthma     2000  . Diabetes mellitus     type i juvenille; 1988  . Hypertension     1998  . Mental disorder     Depression 1990  . Anal fissure   . Hx of adenomatous colonic polyps   . Internal hemorrhoids 04/24/06    on colonoscopy  . Migraine   . Cataract   . GERD (gastroesophageal reflux disease)   . Staphylococcus aureus infection     toe  . Family history of malignant neoplasm of gastrointestinal tract   . Anginal pain   . Depression 10/16/2012  . PONV (postoperative nausea and vomiting)   . Difficult intubation     narrow airway  . Arthritis   . Sleep paralysis   . RLS (restless legs syndrome)    Current Outpatient Prescriptions  Medication Sig Dispense Refill  . albuterol (PROVENTIL HFA;VENTOLIN HFA) 108 (90 BASE) MCG/ACT inhaler Inhale 1-2 puffs into the lungs every 6 (six) hours as needed for wheezing or shortness of breath.      Marland Kitchen amLODipine (NORVASC) 10 MG tablet Take 10 mg by mouth daily.      Marland Kitchen aspirin EC 81 MG tablet Take 325 mg by mouth daily.       . carvedilol (COREG) 3.125 MG tablet Take 3.125 mg by mouth 2 (two) times daily with a meal.      . ciprofloxacin (CIPRO) 750 MG tablet Take 1 tablet (750 mg total) by mouth 2 (two) times daily.  28 tablet  0  . clindamycin (CLEOCIN) 300 MG capsule Take 1 capsule (300 mg total) by mouth 4  (four) times daily.  56 capsule  0  . cloNIDine (CATAPRES) 0.3 MG tablet Take 0.3 mg by mouth 2 (two) times daily.      . colesevelam (WELCHOL) 625 MG tablet Take 1,250 mg by mouth 2 (two) times daily.      Marland Kitchen docusate sodium (COLACE) 100 MG capsule Take 1 capsule (100 mg total) by mouth 3 (three) times daily as needed.  20 capsule  0  . DULoxetine (CYMBALTA) 60 MG capsule Take 60 mg by mouth daily.      . insulin aspart (NOVOLOG) 100 UNIT/ML injection Inject 20-36 Units into the skin 3 (three) times daily before meals. Per sliding scale      . insulin glargine (LANTUS) 100 UNIT/ML injection Inject 33 Units into the skin 2 (two) times daily.      . isosorbide mononitrate (IMDUR) 30 MG 24 hr tablet Take 30 mg by mouth daily as needed (chest pain, pressure).       . nitroGLYCERIN (NITROSTAT) 0.4 MG SL tablet  Place 0.4 mg under the tongue every 5 (five) minutes as needed for chest pain.      Marland Kitchen olopatadine (PATANOL) 0.1 % ophthalmic solution Place 1 drop into both eyes 2 (two) times daily as needed for allergies.       Marland Kitchen oxyCODONE-acetaminophen (PERCOCET/ROXICET) 5-325 MG per tablet Take 1-2 tablets by mouth every 4 (four) hours as needed for moderate pain or severe pain.  60 tablet  0  . oxymetazoline (AFRIN) 0.05 % nasal spray Place 3 sprays into the nose 2 (two) times daily.        Marland Kitchen spironolactone (ALDACTONE) 25 MG tablet Take 1 tablet (25 mg total) by mouth daily.  90 tablet  3   No current facility-administered medications for this visit.   Family History  Problem Relation Age of Onset  . Hypertension Mother   . Kidney disease Mother   . Hypertension Father   . Breast cancer Maternal Grandmother   . Prostate cancer Maternal Grandfather   . Ovarian cancer Paternal Grandmother   . Prostate cancer Paternal Grandfather   . Colon cancer Maternal Uncle    History   Social History  . Marital Status: Divorced    Spouse Name: N/A    Number of Children: 2  . Years of Education: 14    Occupational History  . Unemployed    Social History Main Topics  . Smoking status: Never Smoker   . Smokeless tobacco: Never Used  . Alcohol Use: No  . Drug Use: No     Comment: drug addict  . Sexual Activity: Yes    Birth Control/ Protection: IUD   Other Topics Concern  . None   Social History Narrative   Lost medicaid about 2009 ish when her youngest child turned 15. Has adult children. Lives with boyfriend who financially supports her. Has attempted to get disability but has been turned down.      2-3 caffeine drinks daily    Review of Systems: CONSTITUTIONAL- No Fever, weightloss, night sweat or change in appetite. SKIN- No Rash, colour changes or itching. HEAD- No Headache or dizziness. EYES- No Vision loss, pain, redness, double or blurred vision. EARS- No vertigo, hearing loss or ear discharge. Mouth/throat- No Sorethroat, dentures, or bleeding gums. RESPIRATORY- No Cough or SOB. CARDIAC- No Palpitations, DOE, PND or chest pain. GI- No nausea, vomiting, diarrhoea, constipation, abd pain. URINARY- No Frequency, urgency, straining or dysuria. NEUROLOGIC- No Numbness, syncope, seizures or burning. [facility] Denies depression or anxiety.  Objective:  Physical Exam: Filed Vitals:   10/30/13 1538 10/30/13 1654  BP: 204/96 190/86  Pulse: 79 71  Temp: 98.1 F (36.7 C)   TempSrc: Oral   Height: 5' 4.5" (1.638 m)   Weight: 201 lb 9.6 oz (91.445 kg)   SpO2: 100%    GENERAL- alert, co-operative, appears as stated age, not in any distress. HEENT- Atraumatic, normocephalic, PERRL, EOMI, oral mucosa appears moist, good and intact dentition. No carotid bruit, no cervical LN enlargement, thyroid does not appear enlarged, neck supple. CARDIAC- RRR, no murmurs, rubs or gallops. RESP- Moving equal volumes of air, and clear to auscultation bilaterally, no wheezes or crackles. ABDOMEN- Soft, nontender, no guarding or rebound, no palpable masses or organomegaly, bowel sounds  present. BACK- Normal curvature of the spine, No tenderness along the vertebrae, no CVA tenderness. NEURO- No obvious Cr N abnormality, strenght upper and lower extremities- normal, on wheel chair. EXTREMITIES- Right lower extremity- appears tense, tender, hyperpigmentation in a  patchy distribution to  lower leg, pedal edema, non pitting to lower legs. Incisons sites- let and medial malleola region appear to be healing but with some drainage, around incision, granulation tissue does not look healthy, but incision edges well apposed. SKIN- Warm, dry, No rash or lesion. PSYCH- Normal mood and affect, appropriate thought content and speech.  Assessment & Plan:   The patient's case and plan of care was discussed with attending physician, Dr. Criselda Peaches.  Please see problem based charting for assessment and plan.

## 2013-10-31 LAB — SEDIMENTATION RATE: Sed Rate: 32 mm/hr — ABNORMAL HIGH (ref 0–22)

## 2013-10-31 NOTE — Assessment & Plan Note (Signed)
Pt blood pressure is still elevated today. Pt did not take medications this morning. Impressed on patient on the need to comply with regimen. Appears chronically elevated. Hx of non-compliance.

## 2013-10-31 NOTE — Assessment & Plan Note (Addendum)
Pt is status post ORIF ankle joint- done 10/09/2013. Pt says over the past 2 days, pain has been increasing in severity, incisions sites are both draining, described as yellowish-pinkinsh fluid. Swelling also present and leg is tense. Pt was placed on doxycycline- last Monday- 29/09/2013- Pt has one tablet left. No improvement in appearance of the leg since starting medication, actually now worse. No fever or chills, no malaise.  Plan- Considering the presence of hard ware and uncontrolled diabetes, can not rule out osteomyelitis. - Xray- right and left leg (left leg- pt has a scab over lat side of left leg- about the middle, from injury sustained from same fall down steps that resulted in right ankle fracture, pt says injury was deep and was draining at a time, now she still feels pain in same region ). - ESR- help ascess for Osteomyleitis - CBC - Made an appointment for pt to see Orthopedics- 10/31/2013- determine if pt will benefit from MRI. - Start Clindamycin $RemoveBeforeDEI'300mg'[id number]$  QID and levofloxacin 750 BID for 2 weeks. Concern for polymicrobial etiology for cellulitis in Diabetic pt. Therefore extend coverage for Gram negatives and Anaerobs.

## 2013-11-05 NOTE — Progress Notes (Signed)
Internal Medicine Clinic Attending Date of visit: 10/30/2013  I saw and evaluated the patient.  I personally confirmed the key portions of the history and exam documented by Dr. Mariea Clonts and I reviewed pertinent patient test results.  The assessment, diagnosis, and plan were formulated together and I agree with the documentation in the resident's note.

## 2013-11-07 ENCOUNTER — Encounter: Payer: No Typology Code available for payment source | Admitting: Internal Medicine

## 2013-11-12 ENCOUNTER — Other Ambulatory Visit: Payer: Self-pay | Admitting: *Deleted

## 2013-11-12 MED ORDER — INSULIN ASPART 100 UNIT/ML ~~LOC~~ SOLN: 20.0000 [IU] | Freq: Three times a day (TID) | SUBCUTANEOUS | Status: DC

## 2013-12-29 ENCOUNTER — Other Ambulatory Visit: Payer: Self-pay | Admitting: *Deleted

## 2013-12-29 ENCOUNTER — Encounter: Payer: Self-pay | Admitting: Internal Medicine

## 2013-12-29 MED ORDER — ALBUTEROL SULFATE HFA 108 (90 BASE) MCG/ACT IN AERS: 1.0000 | INHALATION_SPRAY | Freq: Four times a day (QID) | RESPIRATORY_TRACT | Status: DC | PRN

## 2013-12-29 MED ORDER — DULOXETINE HCL 60 MG PO CPEP: 60.0000 mg | ORAL_CAPSULE | Freq: Every day | ORAL | Status: DC

## 2013-12-29 MED ORDER — AMLODIPINE BESYLATE 10 MG PO TABS: 10.0000 mg | ORAL_TABLET | Freq: Every day | ORAL | Status: DC

## 2013-12-30 NOTE — Telephone Encounter (Signed)
Three Rx called in to pharmacy.

## 2013-12-31 ENCOUNTER — Other Ambulatory Visit: Payer: Self-pay | Admitting: *Deleted

## 2014-01-01 MED ORDER — INSULIN ASPART 100 UNIT/ML ~~LOC~~ SOLN: 20.0000 [IU] | Freq: Three times a day (TID) | SUBCUTANEOUS | Status: DC

## 2014-01-09 ENCOUNTER — Telehealth: Payer: Self-pay | Admitting: Dietician

## 2014-01-09 NOTE — Telephone Encounter (Addendum)
Asked to call patient to educate her on her new correction scale. Left messages x3  for her to return my call. Mailed letter with correction and asked her to call with questions or concerns.

## 2014-01-13 ENCOUNTER — Encounter: Payer: Self-pay | Admitting: Dietician

## 2014-01-13 NOTE — Telephone Encounter (Signed)
Thank you Donna 

## 2014-02-13 ENCOUNTER — Ambulatory Visit: Payer: Self-pay

## 2014-03-17 ENCOUNTER — Encounter: Payer: Self-pay | Admitting: Internal Medicine

## 2014-03-19 ENCOUNTER — Other Ambulatory Visit: Payer: Self-pay | Admitting: *Deleted

## 2014-03-19 MED ORDER — CLONIDINE HCL 0.3 MG PO TABS: 0.3000 mg | ORAL_TABLET | Freq: Two times a day (BID) | ORAL | Status: DC

## 2014-03-19 MED ORDER — CARVEDILOL 3.125 MG PO TABS: 3.1250 mg | ORAL_TABLET | Freq: Two times a day (BID) | ORAL | Status: DC

## 2014-03-19 MED ORDER — AMLODIPINE BESYLATE 10 MG PO TABS: 10.0000 mg | ORAL_TABLET | Freq: Every day | ORAL | Status: DC

## 2014-03-19 NOTE — Telephone Encounter (Signed)
Faxed to [email or web address]

## 2014-04-28 ENCOUNTER — Other Ambulatory Visit: Payer: Self-pay | Admitting: *Deleted

## 2014-04-28 NOTE — Telephone Encounter (Signed)
Request is for lantus solostar, please change

## 2014-04-29 ENCOUNTER — Other Ambulatory Visit: Payer: Self-pay | Admitting: Internal Medicine

## 2014-04-29 DIAGNOSIS — E1039 Type 1 diabetes mellitus with other diabetic ophthalmic complication: Secondary | ICD-10-CM

## 2014-04-29 MED ORDER — INSULIN PEN NEEDLE 32G X 4 MM MISC: Status: DC

## 2014-04-29 MED ORDER — INSULIN GLARGINE 100 UNITS/ML SOLOSTAR PEN: 33.0000 [IU] | PEN_INJECTOR | Freq: Two times a day (BID) | SUBCUTANEOUS | Status: DC

## 2014-05-07 ENCOUNTER — Encounter: Payer: Self-pay | Admitting: *Deleted

## 2014-06-03 ENCOUNTER — Telehealth: Payer: Self-pay | Admitting: *Deleted

## 2014-06-03 NOTE — Telephone Encounter (Signed)
Pt told pharmacist at [facility] that this allergy season she is having a hard time, can you send her a script for clarinex 5 mg, they can get it for free

## 2014-06-04 ENCOUNTER — Other Ambulatory Visit: Payer: Self-pay | Admitting: Internal Medicine

## 2014-06-04 MED ORDER — DESLORATADINE 5 MG PO TABS: 5.0000 mg | ORAL_TABLET | Freq: Every day | ORAL | Status: DC

## 2014-06-04 NOTE — Telephone Encounter (Signed)
Yes just sent it in, please let her know, thanks!  Dr. Johna Roles

## 2014-06-19 ENCOUNTER — Encounter: Payer: Self-pay | Admitting: Internal Medicine

## 2014-07-23 ENCOUNTER — Telehealth: Payer: Self-pay | Admitting: Internal Medicine

## 2014-07-23 NOTE — Telephone Encounter (Signed)
Call to patient to confirm appointment for 07/24/14 at 3:45 phone is disconnected

## 2014-07-24 ENCOUNTER — Ambulatory Visit (INDEPENDENT_AMBULATORY_CARE_PROVIDER_SITE_OTHER): Payer: Self-pay | Admitting: Internal Medicine

## 2014-07-24 VITALS — BP 193/107 | HR 91 | Temp 98.4°F | Wt 199.6 lb

## 2014-07-24 DIAGNOSIS — E13311 Other specified diabetes mellitus with unspecified diabetic retinopathy with macular edema: Secondary | ICD-10-CM | POA: Insufficient documentation

## 2014-07-24 DIAGNOSIS — Z794 Long term (current) use of insulin: Secondary | ICD-10-CM

## 2014-07-24 DIAGNOSIS — K529 Noninfective gastroenteritis and colitis, unspecified: Secondary | ICD-10-CM

## 2014-07-24 DIAGNOSIS — Z0000 Encounter for general adult medical examination without abnormal findings: Secondary | ICD-10-CM

## 2014-07-24 DIAGNOSIS — E1065 Type 1 diabetes mellitus with hyperglycemia: Secondary | ICD-10-CM

## 2014-07-24 DIAGNOSIS — I10 Essential (primary) hypertension: Secondary | ICD-10-CM

## 2014-07-24 DIAGNOSIS — E1039 Type 1 diabetes mellitus with other diabetic ophthalmic complication: Secondary | ICD-10-CM

## 2014-07-24 DIAGNOSIS — Z9114 Patient's other noncompliance with medication regimen: Secondary | ICD-10-CM

## 2014-07-24 LAB — POCT GLYCOSYLATED HEMOGLOBIN (HGB A1C): Hemoglobin A1C: 9.6

## 2014-07-24 LAB — GLUCOSE, CAPILLARY: GLUCOSE-CAPILLARY: 255 mg/dL — AB (ref 65–99)

## 2014-07-24 MED ORDER — LISINOPRIL-HYDROCHLOROTHIAZIDE 10-12.5 MG PO TABS: 1.0000 | ORAL_TABLET | Freq: Every day | ORAL | Status: DC

## 2014-07-24 NOTE — Progress Notes (Signed)
Patient ID: Debra [name], female   DOB: 1965-04-19, 49 y.o.   MRN: [id number]    Subjective:   Patient ID: Debra [name] female   DOB: [birth year] 49 y.o.   MRN: [id number]  HPI: [name] is a 49 y.o. woman with past medical history of Type I DM, uncontrolled hypertension, hyperlipidemia, chronic diarrhea, mild intermittent asthma, and depression/anxiety who presents for follow-up of hypertension and diabetes.    She reports compliance with taking clonidine, carvedilol, amlodipine, and spironolactone for hypertension. She denies headache, blurry vision from baseline, chest pain, LE edema from baseline, weakness, paraesthesias, dysarthria,  or lightheadedness.   Her last A1c was 10.9 on 06/09/13 and is compliant with taking Lantus 33 U BID and Humalog 20 U TID. She did not bring her glucose meter today but reports she checks her blood sugars 1-2 times daily with symptomatic low's and high's. She has chronic blurry vision and peripheral neuropathy but denies polyuria, polydipsia, polyphagia, or foot ulcer/injury. She does not follow a healthy diet or exercise. She has lost 2 lbs since last visit. Her last eye exam in August 2015 revealed left diabetic macular edema.   She has history of chronic diarrhea of unknown etiology with extensive work-up by GI that has been unresponsive to medical therapy. Her symptoms interfere with daily functioning.       Past Medical History  Diagnosis Date  . Allergy     seasonal  . Heart murmur     Birth  . Hyperlipidemia     2005  . Stroke     in 1990  . Neuromuscular disorder     Neuropathy - hands/feet  . Anxiety     1990  . Anemia     2005  . Asthma     2000  . Diabetes mellitus     type i juvenille; 1988  . Hypertension     1998  . Mental disorder     Depression 1990  . Anal fissure   . Hx of adenomatous colonic polyps   . Internal hemorrhoids 04/24/06    on colonoscopy  . Migraine   . Cataract   . GERD (gastroesophageal reflux  disease)   . Staphylococcus aureus infection     toe  . Family history of malignant neoplasm of gastrointestinal tract   . Anginal pain   . Depression 10/16/2012  . PONV (postoperative nausea and vomiting)   . Difficult intubation     narrow airway  . Arthritis   . Sleep paralysis   . RLS (restless legs syndrome)    Current Outpatient Prescriptions  Medication Sig Dispense Refill  . albuterol (PROVENTIL HFA;VENTOLIN HFA) 108 (90 BASE) MCG/ACT inhaler Inhale 1-2 puffs into the lungs every 6 (six) hours as needed for wheezing or shortness of breath. 1 Inhaler 3  . amLODipine (NORVASC) 10 MG tablet Take 1 tablet (10 mg total) by mouth daily. 90 tablet 3  . aspirin EC 81 MG tablet Take 325 mg by mouth daily.     . carvedilol (COREG) 3.125 MG tablet Take 1 tablet (3.125 mg total) by mouth 2 (two) times daily with a meal. 180 tablet 3  . cloNIDine (CATAPRES) 0.3 MG tablet Take 1 tablet (0.3 mg total) by mouth 2 (two) times daily. 180 tablet 3  . desloratadine (CLARINEX) 5 MG tablet Take 1 tablet (5 mg total) by mouth daily. 30 tablet 2  . DULoxetine (CYMBALTA) 60 MG capsule Take 1 capsule (60 mg total) by mouth  daily. 30 capsule 5  . insulin aspart (NOVOLOG) 100 UNIT/ML injection Inject 20-36 Units into the skin 3 (three) times daily before meals. 10 mL 12  . insulin glargine (LANTUS) 100 unit/mL SOPN Inject 0.33 mLs (33 Units total) into the skin 2 (two) times daily. 15 mL 11  . Insulin Pen Needle 32G X 4 MM MISC Use to inject insulin twice daily 100 each 5  . isosorbide mononitrate (IMDUR) 30 MG 24 hr tablet Take 30 mg by mouth daily as needed (chest pain, pressure).     . nitroGLYCERIN (NITROSTAT) 0.4 MG SL tablet Place 0.4 mg under the tongue every 5 (five) minutes as needed for chest pain.    Marland Kitchen olopatadine (PATANOL) 0.1 % ophthalmic solution Place 1 drop into both eyes 2 (two) times daily as needed for allergies.     Marland Kitchen oxymetazoline (AFRIN) 0.05 % nasal spray Place 3 sprays into the nose 2  (two) times daily.      Marland Kitchen spironolactone (ALDACTONE) 25 MG tablet Take 1 tablet (25 mg total) by mouth daily. 90 tablet 3   No current facility-administered medications for this visit.   Family History  Problem Relation Age of Onset  . Hypertension Mother   . Kidney disease Mother   . Hypertension Father   . Breast cancer Maternal Grandmother   . Prostate cancer Maternal Grandfather   . Ovarian cancer Paternal Grandmother   . Prostate cancer Paternal Grandfather   . Colon cancer Maternal Uncle    History   Social History  . Marital Status: Divorced    Spouse Name: N/A  . Number of Children: 2  . Years of Education: 14   Occupational History  . Unemployed    Social History Main Topics  . Smoking status: Never Smoker   . Smokeless tobacco: Never Used  . Alcohol Use: No  . Drug Use: No     Comment: drug addict  . Sexual Activity: Yes    Birth Control/ Protection: IUD   Other Topics Concern  . Not on file   Social History Narrative   Lost medicaid about 2009 ish when her youngest child turned 49. Has adult children. Lives with boyfriend who financially supports her. Has attempted to get disability but has been turned down.      2-3 caffeine drinks daily    Review of Systems: Review of Systems  Constitutional: Negative for fever and chills.  Eyes: Positive for blurred vision (chronic).  Respiratory: Negative for cough, shortness of breath and wheezing.   Cardiovascular: Positive for leg swelling (chronic b/l LE). Negative for chest pain.  Gastrointestinal: Positive for nausea, vomiting, abdominal pain and diarrhea. Negative for constipation and blood in stool.       Occasional BRBPR   Genitourinary: Negative for dysuria, urgency, frequency and hematuria.  Musculoskeletal: Negative for myalgias.  Neurological: Positive for sensory change. Negative for dizziness, speech change, focal weakness and headaches.  Endo/Heme/Allergies: Negative for polydipsia.     Objective:  Physical Exam: Filed Vitals:   07/24/14 1620 07/24/14 1657  BP: 228/94 193/107  Pulse: 89 91  Temp: 98.4 F (36.9 C)   TempSrc: Oral   Weight: 199 lb 9.6 oz (90.538 kg)   SpO2: 100%    Physical Exam  Constitutional: She is oriented to person, place, and time. She appears well-developed and well-nourished. No distress.  HENT:  Head: Normocephalic and atraumatic.  Eyes: EOM are normal.  Neck: Normal range of motion. Neck supple.  Cardiovascular: Normal rate and  regular rhythm.   Distant heart sounds  Pulmonary/Chest: Effort normal and breath sounds normal. No respiratory distress. She has no wheezes. She has no rales.  Abdominal: Soft. Bowel sounds are normal. She exhibits no distension. There is no tenderness. There is no rebound and no guarding.  Musculoskeletal: Normal range of motion. She exhibits edema (Trace b/l LE). She exhibits no tenderness.  Neurological: She is alert and oriented to person, place, and time.  Skin: Skin is warm and dry. No rash noted. She is not diaphoretic. No erythema. No pallor.  Psychiatric: She has a normal mood and affect. Her behavior is normal. Judgment and thought content normal.    Assessment & Plan:   Please see problem list for problem-based assessment and plan

## 2014-07-24 NOTE — Patient Instructions (Addendum)
-  Start taking lisinopril-HCTZ daily for high blood pressure, do not take the others you have -Please bring your glucose meter next time, your A1c is 9.6 which is high -Please come back in 2 weeks for your blood pressure and diabetes   General Instructions:   Please bring your medicines with you each time you come to clinic.  Medicines may include prescription medications, over-the-counter medications, herbal remedies, eye drops, vitamins, or other pills.   Progress Toward Treatment Goals:  Treatment Goal 08/05/2013  Hemoglobin A1C deteriorated  Blood pressure deteriorated  Prevent falls at goal    Self Care Goals & Plans:  Self Care Goal 10/06/2013  Manage my medications take my medicines as prescribed; bring my medications to every visit; refill my medications on time; follow the sick day instructions if I am sick  Monitor my health keep track of my blood glucose; check my feet daily  Eat healthy foods eat more vegetables; eat fruit for snacks and desserts; eat baked foods instead of fried foods; eat foods that are low in salt; eat smaller portions; drink diet soda or water instead of juice or soda  Be physically active find an activity I enjoy  Prevent falls -  Meeting treatment goals -    Home Blood Glucose Monitoring 08/05/2013  Check my blood sugar 3 times a day  When to check my blood sugar before breakfast; before dinner; at bedtime     Care Management & Community Referrals:  Referral 08/05/2013  Referrals made for care management support diabetes educator  Referrals made to community resources none

## 2014-07-25 ENCOUNTER — Encounter: Payer: Self-pay | Admitting: Internal Medicine

## 2014-07-25 LAB — MICROALBUMIN / CREATININE URINE RATIO
Creatinine, Urine: 52.2 mg/dL
Microalb Creat Ratio: 783.5 mg/g — ABNORMAL HIGH (ref 0.0–30.0)
Microalb, Ur: 40.9 mg/dL — ABNORMAL HIGH (ref <2.0)

## 2014-07-25 MED ORDER — LOPERAMIDE HCL 2 MG PO CAPS: 2.0000 mg | ORAL_CAPSULE | Freq: Three times a day (TID) | ORAL | Status: DC

## 2014-07-25 NOTE — Assessment & Plan Note (Addendum)
Assessment: Pt with last A1c of 10.2 on 10/06/13 compliant with insulin therapy with frequent symptomatic hypoglycemia who presents with CBG of 255 and very mildly improved A1c of 9.6.   Plan: -A1c 9.6 not at goal <7, Pt did not bring glucose meter for insulin adjustment. Pt to continue Lantus 33 U daily and Humalog 20 U TID and bring glucose meter at next visit in 2 weeks for insulin adjustment -BP 193/107 not at goal <140/90, pt noncompliant confirmed by pharmacist Dr. Selena Batten. Will start lisinopril-HCTZ 10-12.5 mg daily.  -LDL 110 not at goal <100, initiate statin therapy at next visit  -Last annual eye exam on 10/01/13 with diabetic macular edema in left eye, will need referral to retinal specialist but unfortunately does not have insurance  -Perform annual foot exam today -Last annual microalbumin with 0.069 g of protein, repeat today, start lisinopril 10 mg daily -BMI 33.74 not at goal <25, encourage weight loss

## 2014-07-25 NOTE — Assessment & Plan Note (Addendum)
Assessment: Pt with uncontrolled hypertension noncompliant with four-class anti-hypertensive therapy who presents with asymptomatic blood pressure of 193/107.   Plan:  -BP 193/107 not at goal <140/90 -Pharmacist Dr. Maudie Mercury met with pt today and discovered pt noncompliant with medications through pharmacy filling history   -Prescribe lisinopril-HCTZ 10-12.5 mg daily and discontinue clonidine, amlodipine, carvedilol, spironolactone, and imdur PRN -Pt to return in 2 weeks for blood pressure recheck  -Last CMP on 10/11/13 was normal, repeat at next visit

## 2014-07-25 NOTE — Assessment & Plan Note (Addendum)
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  Plan:  -Continue OTC loperamide 2 mg 45 minutes before meals

## 2014-07-25 NOTE — Assessment & Plan Note (Signed)
-  Obtain annual HIV screening at next visit -Inquire about pap smear testing at next visit

## 2014-07-26 NOTE — Progress Notes (Signed)
Internal Medicine Clinic Attending  Case discussed with Dr. Rabbani soon after the resident saw the patient.  We reviewed the resident's history and exam and pertinent patient test results.  I agree with the assessment, diagnosis, and plan of care documented in the resident's note.  

## 2014-07-29 ENCOUNTER — Telehealth: Payer: Self-pay | Admitting: Pharmacist

## 2014-07-29 NOTE — Telephone Encounter (Signed)
Collaborating with PCP for HTN therapy.  Per [facility], patient last refills of HTN medications were January 2016 (30 day supply). According to pharmacy records, patient has not filled any other dates over the past year and still has refills remaining.   Patient reports poor adherence to HTN medications.  Barriers to adherence include: cost and patient understanding of importance  Reinforced education on importance of medication adherence including prevention of CV events  Patient states filling at [facility] ($4 list) will be beneficial. Rx for lisinopril-HCTZ called in to [facility]  Patient advised to contact clinic if any further concerns arise.

## 2014-08-17 ENCOUNTER — Telehealth: Payer: Self-pay | Admitting: Pharmacist

## 2014-08-18 NOTE — Telephone Encounter (Signed)
Patient was contacted for follow up with medication management. Collaborated with PCP in May 2016 for assistance with HTN and CV risk medications. Patient has a history of medication non-adherence.  Patient states she is taking her medications as prescribed and will be in clinic this week. Pharmacy clinic appointment scheduled for follow up.

## 2014-08-19 ENCOUNTER — Ambulatory Visit (INDEPENDENT_AMBULATORY_CARE_PROVIDER_SITE_OTHER): Payer: Self-pay | Admitting: Pharmacist

## 2014-08-19 ENCOUNTER — Ambulatory Visit: Payer: Self-pay

## 2014-08-19 ENCOUNTER — Encounter (HOSPITAL_COMMUNITY): Payer: Self-pay | Admitting: Student-PharmD

## 2014-08-19 ENCOUNTER — Encounter: Payer: Self-pay | Admitting: Pharmacist

## 2014-08-19 ENCOUNTER — Telehealth (HOSPITAL_COMMUNITY): Payer: Self-pay | Admitting: Student-PharmD

## 2014-08-19 VITALS — BP 190/120 | HR 81

## 2014-08-19 DIAGNOSIS — I10 Essential (primary) hypertension: Secondary | ICD-10-CM

## 2014-08-19 LAB — GLUCOSE, CAPILLARY: GLUCOSE-CAPILLARY: 340 mg/dL — AB (ref 65–99)

## 2014-08-19 MED ORDER — LISINOPRIL-HYDROCHLOROTHIAZIDE 20-25 MG PO TABS: 1.0000 | ORAL_TABLET | Freq: Every day | ORAL | Status: DC

## 2014-08-19 NOTE — Telephone Encounter (Signed)
I spoke with Debra [name] to advise her on her medication use and answer her questions from her visit earlier today. During her visit, her BP readings were 190/120 mmHg and 180/118 mmHg, respectively. The decision was made to increase her lisinopril-HCTZ from 10-12.5 daily to 20-25mg  daily. I made the patient aware that a new prescription was called into [facility] for her, but also informed her that she could take 2 tablets of her lisinopril-HCTZ 10-12.5mg  prescription to cover her until she was able to pick up the new prescription. The patient stated her understanding of that she could take 2 of the lisinopril-HCTZ 10-12.5mg  tablets until she picked up the new prescription.   Also, during her visit today, her CBG was measured and found to be 314. Upon further questioning, the patient revealed that she did not use her insulin this morning and she did not eat breakfast. I found this strange as her Lantus is dosed twice daily. While on the phone, I asked her how she normally takes her Lantus and she stated that she takes it at lunch and before bedtime, which is approximately 11pm for this patient. While not ideal, the patient seems to be happy with this regimen and has been doing it that way for "years". Advised to continue Lantus twice daily and to not skip a dose of Lantus if she did not eat.   This patient has a history of chronic diarrhea and asked if there were any new agents available to help treat her diarrhea. This is a hindrance for the patient as she often stays in the bathroom for "hours" at a time. Upon reviewing her medical record, Debra [name] has been on loperamide, diphenoxylate, flagyl, prednisone, dicyclomine, cholestyramine, and colesevelam and has had no appreciable or durable response to these agents. I discussed the possibility that controlling her diabetes may help mitigate her diarrhea symptoms and the patient noted that even when her diabetes was controlled, she still had this problem. I mentioned  hyoscyamine and rifaximin as potential agents to help her symptoms, but that we would need to confirm with Dr. Johna Roles prior to initiating. I asked the patient if she would be okay to follow-up with Dr. Johna Roles within the next 1 to 2 weeks so that she could be evaluated and have labs drawn. Debra [name] seemed amicable to this idea. As another option to treat her diarrhea, I recommended that she increase her dietary fiber intake as well.   Plan: 1. Increased Lisinopril-HCTZ to 20-25mg  daily 2. Scheduled follow-up for next Friday (7/1) for evaluation and labs 3. Advised Debra [name] to incorporate more dietary fiber into her meals 4. Discuss potential for rifaximin or hyoscyamine use in this patient with Dr. Johna Roles

## 2014-08-19 NOTE — Progress Notes (Signed)
Case discussed with pharmacy. Agree with current recommendations. Patient will need to follow up in [facility] for further titration of meds

## 2014-08-19 NOTE — Progress Notes (Signed)
S: 82 yoF presents for an appointment to manage hypertension medications.  She states that she has a headache daily and occasional migraines.  She complains of diarrhea (which is chronic) and recent nausea.  She states that she has difficulty breathing - potentially due to the air quality.  Her PMH is significant for:  HTN, DM1, Angina, Asthma, Knee pain, chronic diarrhea, peripheral neuropathy, depression, hyperlipidemia, macular edema.  Home Meds List: Lisinopril/HCTZ 10/12.5 mg daily, Lantus 33 units BID, Novolog 20-36 units TID before meals  Patient reports compliance with Lisinopril/HCTZ 10/12.5 mg daily.  She reports that she did not take her insulin this morning because she did not eat this morning.  She reports taking Almotriptan for her migraines.  O:  Her Blood Pressures on this visit were 190/120 and 180/118.  Her heart rate was 81.  CBG 314.  A:  Patient presents with uncontrolled hypertension.  On her last visit on 07/24/14, her blood pressure was elevated at 193/107.  Today, her SBP has improved slightly to ~180 on Lisinopril/HCTZ 10/12.5 mg.  She is not within her goal of < 140/90 mmHg.    She also had elevated CBG.  This is likely due to insulin non-compliance.  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  However, patient has not had labs checked in ~ 9 months and does not have pancreatic enzymes lab on record.  Of note, patient has tried metronidazole in the past and in 2013 had a negative C. diff test.  She has also tried loperamide, diphenoxylate, prednisone, and dicyclomine.  P: 1) Uncontrolled Hypertension a. Increase Lisinopril/HCTZ to 20/25 mg Daily - Formulated recommendation with PCP b. Consider adding Carvedilol if BP control is not achieved with Lisinopril/HCTZ dose escalation 2) Reinforce insulin compliance a. Educate patient that even if she does not eat  meals, she should continue to use Lantus b. Evaluate patient's diet and educate on diabetes diet 3) Chronic Diarrhea a. Check Amylase and Lipase, Folate, B-12, Iron, Zinc, Mg, Vit D, CMP, CBC b. Consider starting hyoscyamine or Rifaximin c. Increase fiber intake

## 2014-08-20 ENCOUNTER — Ambulatory Visit: Payer: Self-pay

## 2014-08-20 ENCOUNTER — Ambulatory Visit: Payer: Self-pay | Admitting: Pharmacist

## 2014-08-27 ENCOUNTER — Telehealth: Payer: Self-pay | Admitting: Pharmacist

## 2014-08-27 NOTE — Telephone Encounter (Signed)
Call to patient to confirm appointment for 08/28/14 at 11:00 lmtcb

## 2014-08-28 ENCOUNTER — Ambulatory Visit (INDEPENDENT_AMBULATORY_CARE_PROVIDER_SITE_OTHER): Payer: Self-pay | Admitting: Internal Medicine

## 2014-08-28 ENCOUNTER — Encounter: Payer: Self-pay | Admitting: Internal Medicine

## 2014-08-28 ENCOUNTER — Ambulatory Visit: Payer: Self-pay | Admitting: Pharmacist

## 2014-08-28 VITALS — BP 203/93 | HR 82 | Temp 98.2°F | Wt 198.8 lb

## 2014-08-28 DIAGNOSIS — K529 Noninfective gastroenteritis and colitis, unspecified: Secondary | ICD-10-CM

## 2014-08-28 DIAGNOSIS — Z9114 Patient's other noncompliance with medication regimen: Secondary | ICD-10-CM

## 2014-08-28 DIAGNOSIS — G629 Polyneuropathy, unspecified: Secondary | ICD-10-CM

## 2014-08-28 DIAGNOSIS — E559 Vitamin D deficiency, unspecified: Secondary | ICD-10-CM

## 2014-08-28 DIAGNOSIS — Z0000 Encounter for general adult medical examination without abnormal findings: Secondary | ICD-10-CM

## 2014-08-28 DIAGNOSIS — I10 Essential (primary) hypertension: Secondary | ICD-10-CM

## 2014-08-28 LAB — COMPLETE METABOLIC PANEL WITH GFR
ALT: 16 U/L (ref 0–35)
AST: 15 U/L (ref 0–37)
Albumin: 3.6 g/dL (ref 3.5–5.2)
Alkaline Phosphatase: 113 U/L (ref 39–117)
BILIRUBIN TOTAL: 0.5 mg/dL (ref 0.2–1.2)
BUN: 10 mg/dL (ref 6–23)
CO2: 26 meq/L (ref 19–32)
CREATININE: 0.79 mg/dL (ref 0.50–1.10)
Calcium: 8.8 mg/dL (ref 8.4–10.5)
Chloride: 98 mEq/L (ref 96–112)
GFR, Est African American: >89 mL/min
GFR, Est Non African American: 89 mL/min
Glucose, Bld: 250 mg/dL — ABNORMAL HIGH (ref 70–99)
POTASSIUM: 4.3 meq/L (ref 3.5–5.3)
SODIUM: 139 meq/L (ref 135–145)
TOTAL PROTEIN: 6.8 g/dL (ref 6.0–8.3)

## 2014-08-28 LAB — GLUCOSE, CAPILLARY: GLUCOSE-CAPILLARY: 275 mg/dL — AB (ref 65–99)

## 2014-08-28 LAB — CBC
HCT: 41.8 % (ref 36.0–46.0)
HEMOGLOBIN: 13.9 g/dL (ref 12.0–15.0)
MCH: 29 pg (ref 26.0–34.0)
MCHC: 33.3 g/dL (ref 30.0–36.0)
MCV: 87.3 fL (ref 78.0–100.0)
MPV: 10.3 fL (ref 8.6–12.4)
PLATELETS: 312 10*3/uL (ref 150–400)
RBC: 4.79 MIL/uL (ref 3.87–5.11)
RDW: 14.5 % (ref 11.5–15.5)
WBC: 7.3 10*3/uL (ref 4.0–10.5)

## 2014-08-28 MED ORDER — AMLODIPINE BESYLATE 10 MG PO TABS: 10.0000 mg | ORAL_TABLET | Freq: Every day | ORAL | Status: DC

## 2014-08-28 MED ORDER — AMLODIPINE BESYLATE 10 MG PO TABS
10.0000 mg | ORAL_TABLET | Freq: Every day | ORAL | Status: DC
Start: 2014-08-28 — End: 2014-08-28

## 2014-08-28 MED ORDER — LISINOPRIL-HYDROCHLOROTHIAZIDE 20-25 MG PO TABS: 1.0000 | ORAL_TABLET | Freq: Every day | ORAL | Status: DC

## 2014-08-28 MED ORDER — ACETAMINOPHEN-CODEINE #3 300-30 MG PO TABS
1.0000 | ORAL_TABLET | Freq: Four times a day (QID) | ORAL | Status: DC | PRN
Start: 2014-08-28 — End: 2016-10-31

## 2014-08-28 MED ORDER — RIFAXIMIN 550 MG PO TABS: 550.0000 mg | ORAL_TABLET | Freq: Three times a day (TID) | ORAL | Status: DC

## 2014-08-28 NOTE — Patient Instructions (Signed)
-  Start taking norvasc 10 mg daily for high blood pressure -Keep taking lisinopril-HCTZ  20-25 mg daily for high blood pressure -Stat taking rifaximin 550 mg three times daily for 14 days for your diarrhea  -Start taking Tylenol #3 every 6 hrs as needed for pain  -Will check your bloodwork today  -Please come back in 2 weeks to recheck your blood pressure  General Instructions:   Thank you for bringing your medicines today. This helps [place] keep you safe from mistakes.   Progress Toward Treatment Goals:  Treatment Goal 08/05/2013  Hemoglobin A1C deteriorated  Blood pressure deteriorated  Prevent falls at goal    Self Care Goals & Plans:  Self Care Goal 08/28/2014  Manage my medications take my medicines as prescribed; bring my medications to every visit; refill my medications on time  Monitor my health keep track of my blood glucose; bring my glucose meter and log to each visit; keep track of my blood pressure; check my feet daily  Eat healthy foods eat more vegetables; eat foods that are low in salt; eat baked foods instead of fried foods  Be physically active -  Prevent falls have my vision checked; wear appropriate shoes  Meeting treatment goals -    Home Blood Glucose Monitoring 08/05/2013  Check my blood sugar 3 times a day  When to check my blood sugar before breakfast; before dinner; at bedtime     Care Management & Community Referrals:  Referral 08/05/2013  Referrals made for care management support diabetes educator  Referrals made to community resources none

## 2014-08-28 NOTE — Assessment & Plan Note (Addendum)
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  Plan:  -Prescribe trial of rifaximin 550 TID for 14 days for IBS-diarrhea predominant (Dr. Selena Batten to provide pt with free medication assistance) -Continue OTC loperamide 2 mg 45 minutes before meals -Obtain CMP, CBC, HIV Ab, and 25-OH vitamin D level

## 2014-08-28 NOTE — Assessment & Plan Note (Addendum)
-  Obtain screening HIV Ab -Obtain screening 25-OH vitamin D level    ADDENDUM on 08/31/14: Pt with 25-OH vitamin D level of 7, will prescribe ergocalciferol 50K U weekly for 8 weeks with repeat levels after completion of therapy.

## 2014-08-28 NOTE — Progress Notes (Signed)
Patient ID: Debra [name], female   DOB: Dec 28, 1965, 49 y.o.   MRN: [id number]    Subjective:   Patient ID: Debra [name] female   DOB: May 24, 1965 49 y.o.   MRN: [id number]  HPI: Debra [name] is a 49 y.o. woman with past medical history of Type I DM, uncontrolled hypertension, hyperlipidemia, chronic diarrhea, mild intermittent asthma, and depression/anxiety who presents for follow-up of hypertension.   She reports compliance with taking lisinopril-HCTZ 20-25 mg daily that was just increased on 6/22 however she brought her prior medication which she reports running out yesterday. She has mild headache and lightheadedness but denies blurry vision from baseline, chest pain, LE edema from baseline, weakness, paraesthesias, or dysarthria.   She has history of chronic diarrhea of unknown etiology with extensive work-up by GI that has been unresponsive to medical therapy. Her symptoms interfere with daily functioning.She has never tried rifaxmin in the past.    She reports chronic peripheral neuropathy that is moderately controlled on gabapentin TID (unsure of dose). She would like to try tylenol #3.     Past Medical History  Diagnosis Date  . Allergy     seasonal  . Heart murmur     Birth  . Hyperlipidemia     2005  . Stroke     in 1990  . Neuromuscular disorder     Neuropathy - hands/feet  . Anxiety     1990  . Anemia     2005  . Asthma     2000  . Diabetes mellitus     type i juvenille; 1988  . Hypertension     1998  . Mental disorder     Depression 1990  . Anal fissure   . Hx of adenomatous colonic polyps   . Internal hemorrhoids 04/24/06    on colonoscopy  . Migraine   . Cataract   . GERD (gastroesophageal reflux disease)   . Staphylococcus aureus infection     toe  . Family history of malignant neoplasm of gastrointestinal tract   . Anginal pain   . Depression 10/16/2012  . PONV (postoperative nausea and vomiting)   . Difficult intubation     narrow airway  .  Arthritis   . Sleep paralysis   . RLS (restless legs syndrome)    Current Outpatient Prescriptions  Medication Sig Dispense Refill  . albuterol (PROVENTIL HFA;VENTOLIN HFA) 108 (90 BASE) MCG/ACT inhaler Inhale 1-2 puffs into the lungs every 6 (six) hours as needed for wheezing or shortness of breath. 1 Inhaler 3  . Almotriptan Malate (AXERT PO) Take 1 tablet by mouth as needed.    Marland Kitchen aspirin EC 81 MG tablet Take 81 mg by mouth daily.    Marland Kitchen desloratadine (CLARINEX) 5 MG tablet Take 1 tablet (5 mg total) by mouth daily. 30 tablet 2  . DULoxetine (CYMBALTA) 60 MG capsule Take 1 capsule (60 mg total) by mouth daily. 30 capsule 5  . insulin aspart (NOVOLOG) 100 UNIT/ML injection Inject 20-36 Units into the skin 3 (three) times daily before meals. 10 mL 12  . insulin glargine (LANTUS) 100 unit/mL SOPN Inject 0.33 mLs (33 Units total) into the skin 2 (two) times daily. 15 mL 11  . Insulin Pen Needle 32G X 4 MM MISC Use to inject insulin twice daily 100 each 5  . lisinopril-hydrochlorothiazide (PRINZIDE,ZESTORETIC) 20-25 MG per tablet Take 1 tablet by mouth daily. 30 tablet 3  . loperamide (IMODIUM) 2 MG capsule Take 1 capsule (2  mg total) by mouth 3 (three) times daily before meals. Take 45 minutes before a meal 90 capsule 2  . nitroGLYCERIN (NITROSTAT) 0.4 MG SL tablet Place 0.4 mg under the tongue every 5 (five) minutes as needed for chest pain.     No current facility-administered medications for this visit.   Family History  Problem Relation Age of Onset  . Hypertension Mother   . Kidney disease Mother   . Hypertension Father   . Breast cancer Maternal Grandmother   . Prostate cancer Maternal Grandfather   . Ovarian cancer Paternal Grandmother   . Prostate cancer Paternal Grandfather   . Colon cancer Maternal Uncle    History   Social History  . Marital Status: Divorced    Spouse Name: N/A  . Number of Children: 2  . Years of Education: 14   Occupational History  . Unemployed     Social History Main Topics  . Smoking status: Never Smoker   . Smokeless tobacco: Never Used  . Alcohol Use: No  . Drug Use: No     Comment: drug addict  . Sexual Activity: Yes    Birth Control/ Protection: IUD   Other Topics Concern  . None   Social History Narrative   Lost medicaid about 2009 ish when her youngest child turned 55. Has adult children. Lives with boyfriend who financially supports her. Has attempted to get disability but has been turned down.      2-3 caffeine drinks daily    Review of Systems: Review of Systems  Constitutional: Negative for weight loss.  Eyes: Positive for blurred vision (chronic).  Respiratory: Positive for shortness of breath and wheezing. Negative for cough.   Cardiovascular: Positive for chest pain (occasionally ) and leg swelling (chronic b/l LE edema).  Gastrointestinal: Positive for nausea, vomiting, abdominal pain and diarrhea. Negative for constipation and blood in stool.  Genitourinary: Negative for dysuria, urgency and frequency.  Musculoskeletal:       Trigger finger  Neurological: Positive for dizziness, sensory change (chronic peripheral neuropathy) and headaches.    Objective:  Physical Exam: Filed Vitals:   08/28/14 1056  BP: 203/93  Pulse: 82  Temp: 98.2 F (36.8 C)  TempSrc: Oral  Weight: 198 lb 12.8 oz (90.175 kg)  SpO2: 99%    Physical Exam  Constitutional: She is oriented to person, place, and time. She appears well-developed and well-nourished. No distress.  HENT:  Head: Normocephalic and atraumatic.  Right Ear: External ear normal.  Left Ear: External ear normal.  Nose: Nose normal.  Mouth/Throat: Oropharynx is clear and moist. No oropharyngeal exudate.  Eyes: Conjunctivae and EOM are normal. Pupils are equal, round, and reactive to light. Right eye exhibits no discharge. Left eye exhibits no discharge. No scleral icterus.  Neck: Normal range of motion. Neck supple.  Cardiovascular: Normal rate and  regular rhythm.   Distant heart sounds  Pulmonary/Chest: Effort normal and breath sounds normal. No respiratory distress. She has no wheezes. She has no rales.  Abdominal: Soft. Bowel sounds are normal. She exhibits no distension. There is no tenderness. There is no rebound and no guarding.  Musculoskeletal: Normal range of motion. She exhibits edema (trace b/l LE edema). She exhibits no tenderness.  Neurological: She is alert and oriented to person, place, and time.  Skin: Skin is warm and dry. No rash noted. She is not diaphoretic. No erythema. No pallor.  Psychiatric: She has a normal mood and affect. Her behavior is normal. Judgment and thought  content normal.    Assessment & Plan:   Please see problem list for problem-based assessment and plan

## 2014-08-28 NOTE — Progress Notes (Signed)
Debra [name] is a 49 y.o. female reports for follow up with medication assistance.  Patient reports she has not been taking BP medications as prescribed due to cost. Collaborated with PCP and patient to formulate a care plan. Patient has orange card and was also advised to contact Lynnae January for further support.  Reinforced education on importance of medication adherence. Patient verbalized understanding.  Patient advised to contact clinic if any further concerns arise.

## 2014-08-28 NOTE — Assessment & Plan Note (Addendum)
Assessment:  Pt with chronic peripheral neuropathy most likely due to uncontrolled DM who presents with moderately controlled pain.   Plan:  -Start trial of tylenol #3 Q 6 hr PRN pain (#30)  -Continue gabapentin 300 mg TID and cymbalta 60 mg daily

## 2014-08-28 NOTE — Assessment & Plan Note (Signed)
Assessment: Pt with uncontrolled hypertension with questionable compliance with two-class anti-hypertensive therapy who presents with asymptomatic blood pressure of 203/93.   Plan:  -BP 203/93 not at goal <140/90  -Start amlodipine 10 mg daily -Refill lisinopril-HCTZ 20-25 mg daily -Pt to return in 2 weeks for blood pressure recheck  -Obtain CMP (last one 10/11/13) ---> normal

## 2014-08-29 LAB — HIV ANTIBODY (ROUTINE TESTING W REFLEX): HIV: NONREACTIVE

## 2014-08-29 LAB — VITAMIN D 25 HYDROXY (VIT D DEFICIENCY, FRACTURES): VIT D 25 HYDROXY: 7 ng/mL — AB (ref 30–100)

## 2014-08-31 DIAGNOSIS — E559 Vitamin D deficiency, unspecified: Secondary | ICD-10-CM | POA: Insufficient documentation

## 2014-08-31 MED ORDER — VITAMIN D (ERGOCALCIFEROL) 1.25 MG (50000 UNIT) PO CAPS: 50000.0000 [IU] | ORAL_CAPSULE | ORAL | Status: DC

## 2014-08-31 NOTE — Addendum Note (Signed)
Addended byOtis Brace on: 08/31/2014 07:34 PM   Modules accepted: Orders

## 2014-09-01 ENCOUNTER — Other Ambulatory Visit: Payer: Self-pay | Admitting: *Deleted

## 2014-09-01 ENCOUNTER — Telehealth: Payer: Self-pay | Admitting: Internal Medicine

## 2014-09-01 NOTE — Telephone Encounter (Signed)
Patient requested that only her BP med to be filled at the [facility] because it is cheaper, but  to leave her other meds @ the [facility] on [address].

## 2014-09-01 NOTE — Progress Notes (Signed)
Internal Medicine Clinic Attending  Case discussed with Dr. Rabbani soon after the resident saw the patient.  We reviewed the resident's history and exam and pertinent patient test results.  I agree with the assessment, diagnosis, and plan of care documented in the resident's note.  

## 2014-09-01 NOTE — Telephone Encounter (Signed)
[facility] has the amlodipine script, called and it is being filled, called pt, she does not need inhalers "has plenty, im stocked up". She also states her scripts are at [facility] and she is going to pick them up, HD will call her when amlodipine ready

## 2014-09-04 ENCOUNTER — Telehealth: Payer: Self-pay | Admitting: Licensed Clinical Social Worker

## 2014-09-04 NOTE — Telephone Encounter (Signed)
Ms. Ellinger placed call to CSW.  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  Ms. Windecker has applied for disability, approximately 3 years ago.  Pt states she is still awaiting determination. CSW discussed with PCP.  PCP states EMR does not medically indicate pt is unable to work.  [facility] unable to provide letter.  Pt notified.

## 2014-09-10 ENCOUNTER — Ambulatory Visit: Payer: Self-pay | Admitting: Internal Medicine

## 2014-11-12 ENCOUNTER — Other Ambulatory Visit: Payer: Self-pay | Admitting: *Deleted

## 2014-11-12 MED ORDER — DESLORATADINE 5 MG PO TABS: 5.0000 mg | ORAL_TABLET | Freq: Every day | ORAL | Status: DC

## 2014-11-12 NOTE — Telephone Encounter (Signed)
Please refill for # 90

## 2014-11-19 ENCOUNTER — Other Ambulatory Visit: Payer: Self-pay | Admitting: *Deleted

## 2014-11-19 MED ORDER — ALBUTEROL SULFATE HFA 108 (90 BASE) MCG/ACT IN AERS: 1.0000 | INHALATION_SPRAY | Freq: Four times a day (QID) | RESPIRATORY_TRACT | Status: DC | PRN

## 2014-12-08 ENCOUNTER — Other Ambulatory Visit: Payer: Self-pay

## 2014-12-08 MED ORDER — INSULIN ASPART 100 UNIT/ML ~~LOC~~ SOLN: 20.0000 [IU] | Freq: Three times a day (TID) | SUBCUTANEOUS | Status: DC

## 2014-12-08 MED ORDER — DULOXETINE HCL 60 MG PO CPEP: 60.0000 mg | ORAL_CAPSULE | Freq: Every day | ORAL | Status: DC

## 2015-02-16 ENCOUNTER — Ambulatory Visit: Payer: Self-pay

## 2015-04-13 ENCOUNTER — Telehealth: Payer: Self-pay | Admitting: Internal Medicine

## 2015-04-13 NOTE — Telephone Encounter (Signed)
APPT CALL REMINDER, LMTCB IF SHE NEEDS TO CANCEL

## 2015-04-14 ENCOUNTER — Ambulatory Visit: Payer: Self-pay | Admitting: Pharmacist

## 2015-04-14 ENCOUNTER — Ambulatory Visit (INDEPENDENT_AMBULATORY_CARE_PROVIDER_SITE_OTHER): Payer: Self-pay | Admitting: Internal Medicine

## 2015-04-14 ENCOUNTER — Encounter: Payer: Self-pay | Admitting: Internal Medicine

## 2015-04-14 VITALS — BP 216/104 | HR 82 | Temp 98.3°F | Ht 64.0 in | Wt 179.7 lb

## 2015-04-14 DIAGNOSIS — L03012 Cellulitis of left finger: Secondary | ICD-10-CM

## 2015-04-14 DIAGNOSIS — I10 Essential (primary) hypertension: Secondary | ICD-10-CM

## 2015-04-14 DIAGNOSIS — B9689 Other specified bacterial agents as the cause of diseases classified elsewhere: Secondary | ICD-10-CM

## 2015-04-14 DIAGNOSIS — Z0000 Encounter for general adult medical examination without abnormal findings: Secondary | ICD-10-CM

## 2015-04-14 DIAGNOSIS — E559 Vitamin D deficiency, unspecified: Secondary | ICD-10-CM

## 2015-04-14 DIAGNOSIS — Z79899 Other long term (current) drug therapy: Secondary | ICD-10-CM

## 2015-04-14 DIAGNOSIS — Z9114 Patient's other noncompliance with medication regimen: Secondary | ICD-10-CM

## 2015-04-14 MED ORDER — AMLODIPINE BESYLATE 10 MG PO TABS: 10.0000 mg | ORAL_TABLET | Freq: Every day | ORAL | Status: DC

## 2015-04-14 MED ORDER — LISINOPRIL-HYDROCHLOROTHIAZIDE 20-25 MG PO TABS: 1.0000 | ORAL_TABLET | Freq: Every day | ORAL | Status: DC

## 2015-04-14 MED ORDER — SULFAMETHOXAZOLE-TRIMETHOPRIM 800-160 MG PO TABS: 1.0000 | ORAL_TABLET | Freq: Two times a day (BID) | ORAL | Status: DC

## 2015-04-14 MED FILL — LISINOPRIL-HCTZ 20-25 MG TA: 20-25 | 30 days supply | Qty: 30 | Fill #0

## 2015-04-14 MED FILL — AMLODIPINE BESYLATE 10 MG T: 10 | 30 days supply | Qty: 30 | Fill #0

## 2015-04-14 NOTE — Progress Notes (Signed)
Medication(s) for hypertension were reviewed with the patient, including name, instructions, indication, goals of therapy, potential side effects, importance of adherence, and safe use.  Provided 62-month supply of amlodipine and lisinopril-HCTZ from [facility].  Patient verbalized understanding by repeating back information and was advised to contact me if further medication-related questions arise. Patient was also provided an information handout.

## 2015-04-14 NOTE — Progress Notes (Signed)
[facility] Subjective:   Patient ID: Debra [name] female   DOB: [birth year] 50 y.o.   MRN: [id number]  HPI: Debra [name] is a 51 y.o. female with a PMH detailed below who presents for evaluation of an insect bite.  She reports that on friday she thinks she was bitten on her left pinky finger this became red and swelled she thought it was getting better but last night she noticed a thick white material draining out of the side and had some increased swelling in the area.  She felt that she should seek treatment.  Please see problem based charting below for the status of her chronic medical problems.    Past Medical History  Diagnosis Date  . Allergy     seasonal  . Heart murmur     Birth  . Hyperlipidemia     2005  . Stroke (HCC)     in 1990  . Neuromuscular disorder (HCC)     Neuropathy - hands/feet  . Anxiety     1990  . Anemia     2005  . Asthma     2000  . Diabetes mellitus     type i juvenille; 1988  . Hypertension     1998  . Mental disorder     Depression 1990  . Anal fissure   . Hx of adenomatous colonic polyps   . Internal hemorrhoids 04/24/06    on colonoscopy  . Migraine   . Cataract   . GERD (gastroesophageal reflux disease)   . Staphylococcus aureus infection     toe  . Family history of malignant neoplasm of gastrointestinal tract   . Anginal pain (HCC)   . Depression 10/16/2012  . PONV (postoperative nausea and vomiting)   . Difficult intubation     narrow airway  . Arthritis   . Sleep paralysis   . RLS (restless legs syndrome)    Current Outpatient Prescriptions  Medication Sig Dispense Refill  . acetaminophen-codeine (TYLENOL #3) 300-30 MG per tablet Take 1 tablet by mouth every 6 (six) hours as needed for moderate pain. 30 tablet 0  . albuterol (PROVENTIL HFA;VENTOLIN HFA) 108 (90 BASE) MCG/ACT inhaler Inhale 1-2 puffs into the lungs every 6 (six) hours as needed for wheezing or shortness of breath. 1 Inhaler 3  .  Almotriptan Malate (AXERT PO) Take 1 tablet by mouth as needed.    Marland Kitchen amLODipine (NORVASC) 10 MG tablet Take 1 tablet (10 mg total) by mouth daily. 30 tablet 3  . aspirin EC 81 MG tablet Take 81 mg by mouth daily.    Marland Kitchen desloratadine (CLARINEX) 5 MG tablet Take 1 tablet (5 mg total) by mouth daily. 90 tablet 5  . DULoxetine (CYMBALTA) 60 MG capsule Take 1 capsule (60 mg total) by mouth daily. 30 capsule 5  . gabapentin (NEURONTIN) 300 MG capsule Take 300 mg by mouth 3 (three) times daily.    . insulin aspart (NOVOLOG) 100 UNIT/ML injection Inject 20-36 Units into the skin 3 (three) times daily before meals. 10 mL 12  . insulin glargine (LANTUS) 100 unit/mL SOPN Inject 0.33 mLs (33 Units total) into the skin 2 (two) times daily. 15 mL 11  . Insulin Pen Needle 32G X 4 MM MISC Use to inject insulin twice daily 100 each 5  . lisinopril-hydrochlorothiazide (PRINZIDE,ZESTORETIC) 20-25 MG per tablet Take 1 tablet by mouth daily. 30 tablet 3  . loperamide (IMODIUM) 2 MG capsule Take 1 capsule (2  mg total) by mouth 3 (three) times daily before meals. Take 45 minutes before a meal 90 capsule 2  . nitroGLYCERIN (NITROSTAT) 0.4 MG SL tablet Place 0.4 mg under the tongue every 5 (five) minutes as needed for chest pain.    . rifaximin (XIFAXAN) 550 MG TABS tablet Take 1 tablet (550 mg total) by mouth 3 (three) times daily. 42 tablet 0  . sulfamethoxazole-trimethoprim (BACTRIM DS) 800-160 MG tablet Take 1 tablet by mouth 2 (two) times daily. 14 tablet 0  . Vitamin D, Ergocalciferol, (DRISDOL) 50000 units CAPS capsule Take 1 capsule (50,000 Units total) by mouth every 7 (seven) days. 8 capsule 0   No current facility-administered medications for this visit.   Family History  Problem Relation Age of Onset  . Hypertension Mother   . Kidney disease Mother   . Hypertension Father   . Breast cancer Maternal Grandmother   . Prostate cancer Maternal Grandfather   . Ovarian cancer Paternal Grandmother   . Prostate  cancer Paternal Grandfather   . Colon cancer Maternal Uncle    Social History   Social History  . Marital Status: Divorced    Spouse Name: N/A  . Number of Children: 2  . Years of Education: 14   Occupational History  . Unemployed    Social History Main Topics  . Smoking status: Never Smoker   . Smokeless tobacco: Never Used  . Alcohol Use: No  . Drug Use: No     Comment: drug addict  . Sexual Activity: Yes    Birth Control/ Protection: IUD   Other Topics Concern  . None   Social History Narrative   Lost medicaid about 2009 ish when her youngest child turned 10. Has adult children. Lives with boyfriend who financially supports her. Has attempted to get disability but has been turned down.      2-3 caffeine drinks daily    Review of Systems: Review of Systems  Constitutional: Negative for fever.  Respiratory: Negative for cough and shortness of breath.   Cardiovascular: Negative for chest pain.  Gastrointestinal: Negative for heartburn and abdominal pain.  Genitourinary: Negative for dysuria.  Neurological: Negative for focal weakness and headaches.     Objective:  Physical Exam: Filed Vitals:   04/14/15 1011  BP: 216/104  Pulse: 82  Temp: 98.3 F (36.8 C)  TempSrc: Oral  Height:  (1.626 m)  Weight: 179 lb 11.2 oz (81.511 kg)  SpO2: 100%   Physical Exam  Constitutional: She is well-developed, well-nourished, and in no distress.  Cardiovascular: Normal rate and regular rhythm.   Pulmonary/Chest: Effort normal and breath sounds normal.  Skin:  Left fifth finger with circular scab, mild TTP, mild surrounding erythema, no active drainage,  Nursing note and vitals reviewed.   Assessment & Plan:  Case discussed with Dr. Rogelia Boga  Hypertension HPI: Reports that blood pressure medications do not "work for me."   So she does not take her blood pressure medications.  She denies any headaches, blurry vision, or other symptoms of HTN.  Our pharmacist have  checked with her pharmacy and she has not picked up her medications  A: Uncontrolled Essential HTN  P: - Continue current medications, pharmacy is helping her obtain the medication,  I have told her that I want her to prove that these medications do not work by taking them every day and returning in 1 week. This may not be enough time to evaluate the full benefit of the medication but I  expect to see some improvement.   Vitamin D deficiency HPI: Previous completed 8 week course of Vitamin D.  She requests a recheck of her vitamin D levels  A; Vitamin D deficiency  P: Recheck Vitamin D>>> returned at 5.3 -Restart 50,000 units Vitamin D x 8 weeks, I have told her she will need to continue 1000 to 2000units daily after this and have it rechecked in about 2 months.  Cellulitis of finger A; Purulent Cellulitis of finger due to insect bite  P: Bactrim DS BID x 7 days follow up in 1 week for reevaluation.    Medications Ordered Meds ordered this encounter  Medications  . sulfamethoxazole-trimethoprim (BACTRIM DS) 800-160 MG tablet    Sig: Take 1 tablet by mouth 2 (two) times daily.    Dispense:  14 tablet    Refill:  0  . DISCONTD: Vitamin D, Ergocalciferol, (DRISDOL) 50000 units CAPS capsule    Sig: Take 1 capsule (50,000 Units total) by mouth every 7 (seven) days.    Dispense:  8 capsule    Refill:  0  . Vitamin D, Ergocalciferol, (DRISDOL) 50000 units CAPS capsule    Sig: Take 1 capsule (50,000 Units total) by mouth every 7 (seven) days.    Dispense:  8 capsule    Refill:  0   Other Orders Orders Placed This Encounter  Procedures  . Vitamin D (25 hydroxy)   Follow Up: Return in about 1 week (around 04/21/2015).

## 2015-04-14 NOTE — Patient Instructions (Addendum)
General Instructions:  I want you to take Bactrim twice a day for 1 week.  Please bring your medicines with you each time you come to clinic.  Medicines may include prescription medications, over-the-counter medications, herbal remedies, eye drops, vitamins, or other pills.   Progress Toward Treatment Goals:  Treatment Goal 08/05/2013  Hemoglobin A1C deteriorated  Blood pressure deteriorated  Prevent falls at goal    Self Care Goals & Plans:  Self Care Goal 08/28/2014  Manage my medications take my medicines as prescribed; bring my medications to every visit; refill my medications on time  Monitor my health keep track of my blood glucose; bring my glucose meter and log to each visit; keep track of my blood pressure; check my feet daily  Eat healthy foods eat more vegetables; eat foods that are low in salt; eat baked foods instead of fried foods  Be physically active -  Prevent falls have my vision checked; wear appropriate shoes    Home Blood Glucose Monitoring 08/05/2013  Check my blood sugar 3 times a day  When to check my blood sugar before breakfast; before dinner; at bedtime     Care Management & Community Referrals:  Referral 08/05/2013  Referrals made for care management support diabetes educator  Referrals made to community resources none       Cellulitis Cellulitis is an infection of the skin and the tissue beneath it. The infected area is usually red and tender. Cellulitis occurs most often in the arms and lower legs.  CAUSES  Cellulitis is caused by bacteria that enter the skin through cracks or cuts in the skin. The most common types of bacteria that cause cellulitis are staphylococci and streptococci. SIGNS AND SYMPTOMS   Redness and warmth.  Swelling.  Tenderness or pain.  Fever. DIAGNOSIS  Your health care provider can usually determine what is wrong based on a physical exam. Blood tests may also be done. TREATMENT  Treatment usually involves taking an  antibiotic medicine. HOME CARE INSTRUCTIONS   Take your antibiotic medicine as directed by your health care provider. Finish the antibiotic even if you start to feel better.  Keep the infected arm or leg elevated to reduce swelling.  Apply a warm cloth to the affected area up to 4 times per day to relieve pain.  Take medicines only as directed by your health care provider.  Keep all follow-up visits as directed by your health care provider. SEEK MEDICAL CARE IF:   You notice red streaks coming from the infected area.  Your red area gets larger or turns dark in color.  Your bone or joint underneath the infected area becomes painful after the skin has healed.  Your infection returns in the same area or another area.  You notice a swollen bump in the infected area.  You develop new symptoms.  You have a fever. SEEK IMMEDIATE MEDICAL CARE IF:   You feel very sleepy.  You develop vomiting or diarrhea.  You have a general ill feeling (malaise) with muscle aches and pains.   This information is not intended to replace advice given to you by your health care provider. Make sure you discuss any questions you have with your health care provider.   Document Released: 11/23/2004 Document Revised: 11/04/2014 Document Reviewed: 05/01/2011 Elsevier Interactive Patient Education [employer]

## 2015-04-14 NOTE — Patient Instructions (Signed)
Hypertension Hypertension is another name for high blood pressure. High blood pressure forces your heart to work harder to pump blood. A blood pressure reading has two numbers, which includes a higher number over a lower number (example: 110/72). HYPERTENSION INCREASES YOUR RISK FOR HEART ATTACK AND STROKE. HOME CARE   Have your blood pressure rechecked by your doctor.  Take medicine as told by your doctor. Follow the directions carefully. The medicine does not work as well if you skip doses. Skipping doses also puts you at risk for problems.  Do not smoke.  Reduce salt intake from foods to 2,000 mg (2 grams) total daily.  Monitor your blood pressure at home as told by your doctor. GET HELP IF:  You think you are having a reaction to the medicine you are taking.  You have repeat headaches or feel dizzy.  You have puffiness (swelling) in your ankles.  You have trouble with your vision. GET HELP RIGHT AWAY IF:   You get a very bad headache and are confused.  You feel weak, numb, or faint.  You get chest or belly (abdominal) pain.  You throw up (vomit).  You cannot breathe very well. MAKE SURE YOU:   Understand these instructions.  Will watch your condition.  Will get help right away if you are not doing well or get worse.   This information is not intended to replace advice given to you by your health care provider. Make sure you discuss any questions you have with your health care provider.   Document Released: 08/02/2007 Document Revised: 02/18/2013 Document Reviewed: 12/06/2012 Elsevier Interactive Patient Education [employer]

## 2015-04-14 NOTE — Progress Notes (Signed)
Pharmacist-physician co-visit Per [facility] and [facility], no record of HTN medications being filled since July 2016. Medication(s) for hypertension were reviewed with the patient, including name, instructions, indication, goals of therapy, potential side effects, importance of adherence, and safe use.  Provided 55-month supply of amlodipine and lisinopril-HCTZ from [facility].  Patient verbalized understanding by repeating back information and was advised to contact me if further medication-related questions arise. Patient was also provided an information handout.

## 2015-04-15 LAB — VITAMIN D 25 HYDROXY (VIT D DEFICIENCY, FRACTURES): VIT D 25 HYDROXY: 5.3 ng/mL — AB (ref 30.0–100.0)

## 2015-04-16 DIAGNOSIS — L03019 Cellulitis of unspecified finger: Secondary | ICD-10-CM | POA: Insufficient documentation

## 2015-04-16 MED ORDER — VITAMIN D (ERGOCALCIFEROL) 1.25 MG (50000 UNIT) PO CAPS: 50000.0000 [IU] | ORAL_CAPSULE | ORAL | Status: DC

## 2015-04-16 NOTE — Assessment & Plan Note (Signed)
A; Purulent Cellulitis of finger due to insect bite  P: Bactrim DS BID x 7 days follow up in 1 week for reevaluation.

## 2015-04-16 NOTE — Assessment & Plan Note (Signed)
HPI: Reports that blood pressure medications do not "work for me."   So she does not take her blood pressure medications.  She denies any headaches, blurry vision, or other symptoms of HTN.  Our pharmacist have checked with her pharmacy and she has not picked up her medications  A: Uncontrolled Essential HTN  P: - Continue current medications, pharmacy is helping her obtain the medication,  I have told her that I want her to prove that these medications do not work by taking them every day and returning in 1 week. This may not be enough time to evaluate the full benefit of the medication but I expect to see some improvement.

## 2015-04-16 NOTE — Assessment & Plan Note (Signed)
HPI: Previous completed 8 week course of Vitamin D.  She requests a recheck of her vitamin D levels  A; Vitamin D deficiency  P: Recheck Vitamin D>>> returned at 5.3 -Restart 50,000 units Vitamin D x 8 weeks, I have told her she will need to continue 1000 to 2000units daily after this and have it rechecked in about 2 months.

## 2015-04-19 NOTE — Progress Notes (Signed)
Internal Medicine Clinic Attending  Case discussed with Dr. Hoffman soon after the resident saw the patient.  We reviewed the resident's history and exam and pertinent patient test results.  I agree with the assessment, diagnosis, and plan of care documented in the resident's note. 

## 2015-04-21 ENCOUNTER — Encounter: Payer: Self-pay | Admitting: Internal Medicine

## 2015-04-21 ENCOUNTER — Ambulatory Visit (INDEPENDENT_AMBULATORY_CARE_PROVIDER_SITE_OTHER): Payer: Self-pay | Admitting: Internal Medicine

## 2015-04-21 VITALS — BP 180/100 | HR 80 | Temp 98.3°F | Wt 178.3 lb

## 2015-04-21 DIAGNOSIS — I10 Essential (primary) hypertension: Secondary | ICD-10-CM

## 2015-04-21 DIAGNOSIS — E1039 Type 1 diabetes mellitus with other diabetic ophthalmic complication: Secondary | ICD-10-CM

## 2015-04-21 DIAGNOSIS — Z794 Long term (current) use of insulin: Secondary | ICD-10-CM

## 2015-04-21 DIAGNOSIS — E785 Hyperlipidemia, unspecified: Secondary | ICD-10-CM

## 2015-04-21 DIAGNOSIS — E10311 Type 1 diabetes mellitus with unspecified diabetic retinopathy with macular edema: Secondary | ICD-10-CM

## 2015-04-21 DIAGNOSIS — Z0000 Encounter for general adult medical examination without abnormal findings: Secondary | ICD-10-CM

## 2015-04-21 DIAGNOSIS — E1065 Type 1 diabetes mellitus with hyperglycemia: Secondary | ICD-10-CM

## 2015-04-21 LAB — GLUCOSE, CAPILLARY: GLUCOSE-CAPILLARY: 381 mg/dL — AB (ref 65–99)

## 2015-04-21 LAB — POCT GLYCOSYLATED HEMOGLOBIN (HGB A1C): HEMOGLOBIN A1C: 13.8

## 2015-04-21 MED ORDER — SPIRONOLACTONE 25 MG PO TABS: 25.0000 mg | ORAL_TABLET | Freq: Every day | ORAL | Status: DC

## 2015-04-21 MED ORDER — ATORVASTATIN CALCIUM 40 MG PO TABS: 40.0000 mg | ORAL_TABLET | Freq: Every day | ORAL | Status: DC

## 2015-04-21 MED ORDER — OLMESARTAN-AMLODIPINE-HCTZ 40-10-25 MG PO TABS: 1.0000 | ORAL_TABLET | Freq: Every day | ORAL | Status: DC

## 2015-04-21 MED ORDER — INSULIN GLARGINE 100 UNITS/ML SOLOSTAR PEN: 33.0000 [IU] | PEN_INJECTOR | Freq: Two times a day (BID) | SUBCUTANEOUS | Status: DC

## 2015-04-21 MED FILL — LANTUS SOLOSTAR 100 UNITS/M: 100 | 23 days supply | Qty: 15 | Fill #0

## 2015-04-21 MED FILL — ATORVASTATIN 40 MG TABLET: 40 | 30 days supply | Qty: 30 | Fill #0

## 2015-04-21 NOTE — Progress Notes (Signed)
Patient ID: Debra [name], female   DOB: 07-Jul-1965, 50 y.o.   MRN: [id number]    Subjective:   Patient ID: [name] female   DOB: 05-04-1965 50 y.o.   MRN: [id number]  HPI: [name] is a 50 y.o. woman with past medical history of Type I DM, uncontrolled hypertension, hyperlipidemia, chronic diarrhea, mild intermittent asthma, and depression/anxiety who presents for follow-up of diabetes and uncontrolled hypertension.   Her last A1c was 9.6 on 07/24/14. She reports taking Lantus 34 U BID and Novolog 20-36 U daily with meals but unable to afford test strips to test her blood sugars at home. She reports having frequent episodes of symptomatic hypoglycemia. She has chronic blurry vision and chronic peripheral neuropathy but denies polydipsia, polyuria, polyphagia, or foot injury/ulceratin. She does not follow a healthy diet or exercise. She has lost 20 lb since July of 2016. 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   She reports compliance with taking norvasc and lisinopril-HCTZ for hypertension but today did not take the combination pill. She has chronic headache and lightheadedness but denies chest pain or LE edema.   Her last lipid panel on 10/06/13 revealed hyperlipidemia with LDL 110. She is not currently on statin therapy.   She declines flu shot.    Past Medical History  Diagnosis Date  . Allergy     seasonal  . Heart murmur     Birth  . Hyperlipidemia     2005  . Stroke (HCC)     in 1990  . Neuromuscular disorder (HCC)     Neuropathy - hands/feet  . Anxiety     1990  . Anemia     2005  . Asthma     2000  . Diabetes mellitus     type i juvenille; 1988  . Hypertension     1998  . Mental disorder     Depression 1990  . Anal fissure   . Hx of adenomatous colonic polyps   . Internal hemorrhoids 04/24/06    on colonoscopy  . Migraine   . Cataract    . GERD (gastroesophageal reflux disease)   . Staphylococcus aureus infection     toe  . Family history of malignant neoplasm of gastrointestinal tract   . Anginal pain (HCC)   . Depression 10/16/2012  . PONV (postoperative nausea and vomiting)   . Difficult intubation     narrow airway  . Arthritis   . Sleep paralysis   . RLS (restless legs syndrome)    Current Outpatient Prescriptions  Medication Sig Dispense Refill  . acetaminophen-codeine (TYLENOL #3) 300-30 MG per tablet Take 1 tablet by mouth every 6 (six) hours as needed for moderate pain. 30 tablet 0  . albuterol (PROVENTIL HFA;VENTOLIN HFA) 108 (90 BASE) MCG/ACT inhaler Inhale 1-2 puffs into the lungs every 6 (six) hours as needed for wheezing or shortness of breath. 1 Inhaler 3  . Almotriptan Malate (AXERT PO) Take 1 tablet by mouth as needed.    Marland Kitchen amLODipine (NORVASC) 10 MG tablet Take 1 tablet (10 mg total) by mouth daily. 30 tablet 3  . aspirin EC 81 MG tablet Take 81 mg by mouth daily.    Marland Kitchen desloratadine (CLARINEX) 5 MG tablet Take 1 tablet (5 mg total) by mouth daily. 90 tablet 5  . DULoxetine (CYMBALTA) 60 MG capsule Take 1 capsule (60  mg total) by mouth daily. 30 capsule 5  . gabapentin (NEURONTIN) 300 MG capsule Take 300 mg by mouth 3 (three) times daily.    . insulin aspart (NOVOLOG) 100 UNIT/ML injection Inject 20-36 Units into the skin 3 (three) times daily before meals. 10 mL 12  . insulin glargine (LANTUS) 100 unit/mL SOPN Inject 0.33 mLs (33 Units total) into the skin 2 (two) times daily. 15 mL 11  . Insulin Pen Needle 32G X 4 MM MISC Use to inject insulin twice daily 100 each 5  . lisinopril-hydrochlorothiazide (PRINZIDE,ZESTORETIC) 20-25 MG per tablet Take 1 tablet by mouth daily. 30 tablet 3  . loperamide (IMODIUM) 2 MG capsule Take 1 capsule (2 mg total) by mouth 3 (three) times daily before meals. Take 45 minutes before a meal 90 capsule 2  . nitroGLYCERIN (NITROSTAT) 0.4 MG SL tablet Place 0.4 mg under the  tongue every 5 (five) minutes as needed for chest pain.    . rifaximin (XIFAXAN) 550 MG TABS tablet Take 1 tablet (550 mg total) by mouth 3 (three) times daily. 42 tablet 0  . sulfamethoxazole-trimethoprim (BACTRIM DS) 800-160 MG tablet Take 1 tablet by mouth 2 (two) times daily. 14 tablet 0  . Vitamin D, Ergocalciferol, (DRISDOL) 50000 units CAPS capsule Take 1 capsule (50,000 Units total) by mouth every 7 (seven) days. 8 capsule 0   No current facility-administered medications for this visit.   Family History  Problem Relation Age of Onset  . Hypertension Mother   . Kidney disease Mother   . Hypertension Father   . Breast cancer Maternal Grandmother   . Prostate cancer Maternal Grandfather   . Ovarian cancer Paternal Grandmother   . Prostate cancer Paternal Grandfather   . Colon cancer Maternal Uncle    Social History   Social History  . Marital Status: Divorced    Spouse Name: N/A  . Number of Children: 2  . Years of Education: 14   Occupational History  . Unemployed    Social History Main Topics  . Smoking status: Never Smoker   . Smokeless tobacco: Never Used  . Alcohol Use: No  . Drug Use: No     Comment: drug addict  . Sexual Activity: Yes    Birth Control/ Protection: IUD   Other Topics Concern  . Not on file   Social History Narrative   Lost medicaid about 2009 ish when her youngest child turned 15. Has adult children. Lives with boyfriend who financially supports her. Has attempted to get disability but has been turned down.      2-3 caffeine drinks daily    Review of Systems: Review of Systems  Constitutional: Positive for weight loss (unintentional). Negative for fever and chills.  Eyes: Positive for blurred vision (chronic).  Respiratory: Negative for cough, shortness of breath and wheezing.        Asthma  Cardiovascular: Negative for chest pain and leg swelling.  Gastrointestinal: Positive for nausea, vomiting, abdominal pain (chronic) and diarrhea  (chronic). Negative for constipation.  Genitourinary: Negative for dysuria, urgency and frequency.       Vaginal spotting  Musculoskeletal: Positive for joint pain (left finger ).  Neurological: Positive for sensory change (chronic peripheral neuropathy) and headaches. Negative for dizziness and focal weakness.  Endo/Heme/Allergies: Negative for polydipsia.     Objective:  Physical Exam: Filed Vitals:   04/21/15 0942 04/21/15 1010  BP: 200/99 180/100  Pulse: 80 80  Temp: 98.3 F (36.8 C)   TempSrc: Oral  Weight: 178 lb 4.8 oz (80.876 kg)   SpO2: 100%     Physical Exam  Constitutional: She is oriented to person, place, and time. She appears well-developed and well-nourished. No distress.  HENT:  Head: Normocephalic and atraumatic.  Right Ear: External ear normal.  Left Ear: External ear normal.  Nose: Nose normal.  Mouth/Throat: Oropharynx is clear and moist. No oropharyngeal exudate.  Eyes: Conjunctivae and EOM are normal. Pupils are equal, round, and reactive to light. Right eye exhibits no discharge. Left eye exhibits no discharge. No scleral icterus.  Neck: Normal range of motion. Neck supple.  Cardiovascular: Normal rate, regular rhythm and normal heart sounds.   Pulmonary/Chest: Effort normal and breath sounds normal. No respiratory distress. She has no wheezes. She has no rales.  Abdominal: Soft. Bowel sounds are normal. She exhibits no distension. There is no tenderness. There is no rebound and no guarding.  Musculoskeletal: Normal range of motion. She exhibits no edema or tenderness.  Neurological: She is alert and oriented to person, place, and time.  Skin: Skin is warm and dry. No rash noted. She is not diaphoretic. No erythema. No pallor.  No cellulitis of left digit  Psychiatric: She has a normal mood and affect. Her behavior is normal. Judgment and thought content normal.    Assessment & Plan:   Please see problem list for problem-based assessment and plan

## 2015-04-21 NOTE — Patient Instructions (Addendum)
-  Your A1c has gotten worse to 13.8 from 9.6, please starting checking your blood sugar and bring your meter next time so we can adjust your insulin -Starting taking spironolactone 25 mg daily for high blood pressure.  -Start taking lipitor 40 mg daily for high cholesterol -Will check your bloodwork and call you with the results -Will refer you to the eye doctor, they will call you to schedule a day/time -Please come back in 2 weeks to 1 month for your diabetes and blood pressure, very nice seeing you today!  General Instructions:   Please bring your medicines with you each time you come to clinic.  Medicines may include prescription medications, over-the-counter medications, herbal remedies, eye drops, vitamins, or other pills.   Progress Toward Treatment Goals:  Treatment Goal 08/05/2013  Hemoglobin A1C deteriorated  Blood pressure deteriorated  Prevent falls at goal    Self Care Goals & Plans:  Self Care Goal 08/28/2014  Manage my medications take my medicines as prescribed; bring my medications to every visit; refill my medications on time  Monitor my health keep track of my blood glucose; bring my glucose meter and log to each visit; keep track of my blood pressure; check my feet daily  Eat healthy foods eat more vegetables; eat foods that are low in salt; eat baked foods instead of fried foods  Be physically active -  Prevent falls have my vision checked; wear appropriate shoes    Home Blood Glucose Monitoring 08/05/2013  Check my blood sugar 3 times a day  When to check my blood sugar before breakfast; before dinner; at bedtime     Care Management & Community Referrals:  Referral 08/05/2013  Referrals made for care management support diabetes educator  Referrals made to community resources none

## 2015-04-22 ENCOUNTER — Telehealth: Payer: Self-pay | Admitting: Licensed Clinical Social Worker

## 2015-04-22 LAB — CMP14 + ANION GAP
ALK PHOS: 111 IU/L (ref 39–117)
ALT: 18 IU/L (ref 0–32)
ANION GAP: 15 mmol/L (ref 10.0–18.0)
AST: 18 IU/L (ref 0–40)
Albumin/Globulin Ratio: 1.3 (ref 1.1–2.5)
Albumin: 3.6 g/dL (ref 3.5–5.5)
BUN/Creatinine Ratio: 12 (ref 9–23)
BUN: 11 mg/dL (ref 6–24)
CHLORIDE: 96 mmol/L (ref 96–106)
CO2: 24 mmol/L (ref 18–29)
CREATININE: 0.91 mg/dL (ref 0.57–1.00)
Calcium: 8.6 mg/dL — ABNORMAL LOW (ref 8.7–10.2)
GFR calc Af Amer: 86 mL/min/{1.73_m2} (ref >59)
GFR calc non Af Amer: 75 mL/min/{1.73_m2} (ref >59)
GLOBULIN, TOTAL: 2.8 g/dL (ref 1.5–4.5)
Glucose: 404 mg/dL — ABNORMAL HIGH (ref 65–99)
Potassium: 4.7 mmol/L (ref 3.5–5.2)
SODIUM: 135 mmol/L (ref 134–144)
Total Protein: 6.4 g/dL (ref 6.0–8.5)

## 2015-04-22 LAB — LIPID PANEL
CHOL/HDL RATIO: 3.8 ratio (ref 0.0–4.4)
Cholesterol, Total: 184 mg/dL (ref 100–199)
HDL: 49 mg/dL (ref >39)
LDL CALC: 112 mg/dL — AB (ref 0–99)
TRIGLYCERIDES: 116 mg/dL (ref 0–149)
VLDL Cholesterol Cal: 23 mg/dL (ref 5–40)

## 2015-04-22 MED ORDER — INSULIN GLARGINE 100 UNITS/ML SOLOSTAR PEN: 33.0000 [IU] | PEN_INJECTOR | Freq: Two times a day (BID) | SUBCUTANEOUS | Status: DC

## 2015-04-22 MED ORDER — ATORVASTATIN CALCIUM 40 MG PO TABS: 40.0000 mg | ORAL_TABLET | Freq: Every day | ORAL | Status: DC

## 2015-04-22 MED ORDER — AGAMATRIX PRESTO PRO METER DEVI: Status: DC

## 2015-04-22 MED ORDER — SPIRONOLACTONE 25 MG PO TABS
25.0000 mg | ORAL_TABLET | Freq: Every day | ORAL | Status: DC
Start: 2015-04-22 — End: 2016-02-26

## 2015-04-22 MED ORDER — INSULIN ASPART 100 UNIT/ML FLEXPEN: 20.0000 [IU] | PEN_INJECTOR | Freq: Three times a day (TID) | SUBCUTANEOUS | Status: DC

## 2015-04-22 MED ORDER — GLUCOSE BLOOD VI STRP: ORAL_STRIP | Status: DC

## 2015-04-22 MED ORDER — OLMESARTAN-AMLODIPINE-HCTZ 40-10-25 MG PO TABS: 1.0000 | ORAL_TABLET | Freq: Every day | ORAL | Status: DC

## 2015-04-22 MED FILL — SPIRONOLACTONE 25 MG TABLET: 25 | 30 days supply | Qty: 30 | Fill #0

## 2015-04-22 MED FILL — *TRIBENZOR 40/10/25 MG TABL: 40-10-25 | 30 days supply | Qty: 30 | Fill #0

## 2015-04-22 MED FILL — *NOVOLOG FLX PEN SYG 5X3ML: 100 | 30 days supply | Qty: 30 | Fill #0

## 2015-04-22 NOTE — Telephone Encounter (Signed)
CSW placed called to pt to inquire about barriers to care.  CSW left message requesting return call. CSW provided contact hours and phone number.  Ms. Hubby is current with her GCCN/Orange card and CFA.  Pt is linked with the MAP program.

## 2015-04-22 NOTE — Progress Notes (Signed)
Pharmacist-physician co-visit Contacted [facility] to clarify availability of Tribenzor. Dawn, pharmacist stated that patient last filled DM and HTN medications in December 2016 but they were not picked up.

## 2015-04-23 NOTE — Progress Notes (Signed)
Medicine attending: Medical history, presenting problems, physical findings, and medications, reviewed with resident physician Dr Marjan Rabbani on the day of the patient visit and I concur with her evaluation and management plan. 

## 2015-04-23 NOTE — Assessment & Plan Note (Signed)
Assessment: Pt with uncontrolled hypertension with reported compliance with three-class (CCB, diuretic, and ACEi) anti-hypertensive therapy who presents with asymptomatic blood pressure of 200/94.  Plan: -BP 200/99 not at goal <140/90, reports not taking lisinopril-HCTZ this morning  -Please see note by Dr. Selena Batten -Prescribe spironolactone 25 mg daily  -Change norvasc and lisinopril-HCTZ to combo olmesartan-amlodipine-HCTZ 40-10-25 mg daily -Obtain CMP  ---> mildly reduced GFR of 86, continue to monitor

## 2015-04-23 NOTE — Assessment & Plan Note (Signed)
Assessment: Pt with last lipid panel on 10/06/13 with LDL 110 not on statin therapy with recommendations to start high-intensity statin therapy due to being in statin benefit group (DM) and 10-yr ASCVD risk > 7.5%.   Plan:  -Obtain annual lipid panel ---> LDL 112 -Prescribe high-intensity atorvastatin 40 mg daily  -Obtain CMP ---> normal liver function -Monitor for myalgias

## 2015-04-23 NOTE — Assessment & Plan Note (Addendum)
Assessment: Pt with last A1c of 9.6 on 07/24/14 with reported compliance with insulin therapy with frequent symptomatic hypoglycemia who presents with CBG of 381 and woresened  A1c of 13.8.   Plan: -A1c 13.8 not at goal <7, Pt does not check blood sugars at home. Will work with Dr. Selena Batten to see if we can provide her with free supplies. Pt to continue reported Lantus 34 U daily and Humalog 20-36 U TID with meals.  -BP 200/99 not at goal <140/90, Please see note by Dr. Selena Batten. Start spironolactone 25 mg daily and change norvasc and lisinopril-HCTZ to combo olmesartan-amlodipine-HCTZ 40-10-25 mg daily.  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

## 2015-04-23 NOTE — Assessment & Plan Note (Addendum)
-  Pt declined annual influenza vaccination -Obtain pap smear at next visit in setting of vaginal spotting, due 08/2015

## 2015-04-28 NOTE — Telephone Encounter (Signed)
CSW placed called to pt.  CSW left message requesting return call. CSW provided contact hours and phone number. 

## 2015-05-04 ENCOUNTER — Telehealth: Payer: Self-pay | Admitting: Internal Medicine

## 2015-05-04 NOTE — Telephone Encounter (Signed)
APPT. REMINDER CALL, LMTCB °

## 2015-05-05 ENCOUNTER — Ambulatory Visit (INDEPENDENT_AMBULATORY_CARE_PROVIDER_SITE_OTHER): Payer: Self-pay | Admitting: Internal Medicine

## 2015-05-05 ENCOUNTER — Encounter: Payer: Self-pay | Admitting: Internal Medicine

## 2015-05-05 ENCOUNTER — Ambulatory Visit: Payer: Self-pay | Admitting: Pharmacist

## 2015-05-05 VITALS — BP 218/91 | HR 80 | Temp 98.0°F | Ht 64.0 in | Wt 181.5 lb

## 2015-05-05 DIAGNOSIS — Z794 Long term (current) use of insulin: Secondary | ICD-10-CM

## 2015-05-05 DIAGNOSIS — Z23 Encounter for immunization: Secondary | ICD-10-CM

## 2015-05-05 DIAGNOSIS — Z9114 Patient's other noncompliance with medication regimen: Secondary | ICD-10-CM

## 2015-05-05 DIAGNOSIS — E1039 Type 1 diabetes mellitus with other diabetic ophthalmic complication: Secondary | ICD-10-CM

## 2015-05-05 DIAGNOSIS — I10 Essential (primary) hypertension: Secondary | ICD-10-CM

## 2015-05-05 NOTE — Patient Instructions (Addendum)
Please start taking spironolactone 25mg  daily and olmesartan-amlodipine-hctz 40-10-25 mg daily   Please start checking your blood sugar at least 3 times a day and bring the records with you next time when you follow up.  Please follow up with your PCP Dr. Johna Roles in 2-3 weeks for BP and Diabetes.

## 2015-05-05 NOTE — Progress Notes (Signed)
   Subjective:    Patient ID: Debra [name], female    DOB: 02-09-1966, 50 y.o.   MRN: [id number]  HPI  50 yo female with type I DM, HTN, HLD here for follow up of HTN and DM II  HTN: BP has always been elevated in the past encounters over 190-200's. Was put on spironolactone 25mg  daily and olmesartan-amlodipine-hctz 40-10-25 mg daily (from norvasc lisinopril-hctz). However, she states she did not know about the change and she is still taking her lisinopril-hctz and amlodipine only. BP high today 218/91. Has chronic headache, chronic blurry vision, choric neuropathic pain and numbness on hands. However, no acute changes to this. No chest pain or SOB.    DM II: a1c 13.8. Supposed to be on Lantus 34 u + humalog 20-36 u TID with meals. Has no testing supplies at home. Has not checked her bs at home recently. Wants eye referral for blurry vision and new glasses.   I discussed her case with our clinical pharmacist Dr. Selena Batten. Dr. Selena Batten has brought free medication for her from our pharmacy at (spironolactone and  olmesartan-amlodipine-hctz 40-10-25 combo). Dr. Selena Batten called the health department and found out that she has not picked up many of her medications from there including her insulin. She also is eligible to get free supplies for your blood sugar monitoring which she has not utilized either.   Review of Systems  Constitutional: Negative for fever, chills and fatigue.  HENT: Negative for congestion and rhinorrhea.   Eyes:       Blurry vision  Respiratory: Negative for chest tightness, shortness of breath and wheezing.   Cardiovascular: Negative for chest pain, palpitations and leg swelling.  Gastrointestinal: Negative for abdominal pain and abdominal distention.  Genitourinary: Negative for dysuria and hematuria.  Musculoskeletal: Negative for back pain and arthralgias.  Skin: Negative.   Neurological: Positive for numbness and headaches. Negative for dizziness and light-headedness.    Psychiatric/Behavioral: Negative for behavioral problems and agitation.       Objective:   Physical Exam  Constitutional: She is oriented to person, place, and time. She appears well-developed and well-nourished. No distress.  HENT:  Head: Normocephalic and atraumatic.  Eyes: Conjunctivae and EOM are normal. Pupils are equal, round, and reactive to light.  Neck: Normal range of motion.  Cardiovascular: Exam reveals no gallop and no friction rub.   No murmur heard. Pulmonary/Chest: Effort normal and breath sounds normal. No respiratory distress. She has no wheezes. She has no rales.  Abdominal: Soft. Bowel sounds are normal. She exhibits no distension. There is no tenderness.  Musculoskeletal: Normal range of motion. She exhibits no edema or tenderness.  Neurological: She is alert and oriented to person, place, and time. No cranial nerve deficit.  Skin: She is not diaphoretic.     Filed Vitals:   05/05/15 1017  BP: 218/91  Pulse: 80  Temp: 98 F (36.7 C)        Assessment & Plan:  See problem based a&p.

## 2015-05-05 NOTE — Assessment & Plan Note (Signed)
Currently taking Lantus 34 units BID + sliding scal humalog 20-36 units TID. Does not check her sugar at home because she does not have the supplies.  Dr. Selena Batten called the health dept and confirmed that she is eligible for free diabetes supplies from them but she has not picked up anything recently from there.   Encouraged patient to get the supplies and start checking her sugar at least 3x daily. F/up with meter log next visit in 2-3 weeks with Dr. Johna Roles.  Referred to ophthalmology for blurry vision/new glass.

## 2015-05-05 NOTE — Assessment & Plan Note (Addendum)
Patient has chronically elevated BP in 190-200's.  Filed Vitals:   05/05/15 1017  BP: 218/91  Pulse: 80  Temp: 98 F (36.7 C)   Hasn't started taking her spironolactone. Still taking her old BP meds lisinopril-hctz 40-25 and amlodipine 10mg . Was not sure that we wanted her to take new med. I again notified her about her new medication:  spironolactone 25mg  daily and olmesartan-amlodipine-hctz 40-10-25 mg daily (combo pill). Dr. Selena Batten has given her free supply from our pharmacy today.  She has no HTN urgency or emergency symptoms. Her headache, blurry vision, and hand numbness/tingling are all chronic and her BP is always in this range so I don't feel there is any acute change due to her BP. I feel that it would be safe to monitor and treat her HTN in outpatient setting with close f/up.  F/up in 2-3 weeks for HTN with Dr. Johna Roles.

## 2015-05-07 ENCOUNTER — Encounter: Payer: Self-pay | Admitting: Pharmacist

## 2015-05-07 NOTE — Progress Notes (Signed)
Internal Medicine Clinic Attending  Case discussed with Dr. Ahmed at the time of the visit.  We reviewed the resident's history and exam and pertinent patient test results.  I agree with the assessment, diagnosis, and plan of care documented in the resident's note. 

## 2015-05-07 NOTE — Addendum Note (Signed)
Addended by: Erlinda Hong T on: 05/07/2015 02:05 PM   Modules accepted: Level of Service

## 2015-05-07 NOTE — Patient Instructions (Signed)
Patient educated about medication as defined in this encounter and verbalized understanding by repeating back instructions provided.   

## 2015-05-07 NOTE — Progress Notes (Signed)
Medication(s) were reviewed with the patient, including name, instructions, indication, goals of therapy, potential side effects, emphasized the importance of adherence, and safe use. Assistance with medications from [facility] outpatient pharmacy.  Patient verbalized understanding by repeating back information and was advised to contact me if further medication-related questions arise. Patient was also provided an information handout.

## 2015-08-24 ENCOUNTER — Encounter: Payer: Self-pay | Admitting: *Deleted

## 2015-11-08 ENCOUNTER — Telehealth: Payer: Self-pay | Admitting: Internal Medicine

## 2015-11-08 NOTE — Telephone Encounter (Signed)
REMINDER CALL, IT IS TIME TO RENEW GCCN CARD, LMTCB °

## 2016-02-23 ENCOUNTER — Ambulatory Visit (HOSPITAL_COMMUNITY)
Admission: EM | Admit: 2016-02-23 | Discharge: 2016-02-23 | Disposition: A | Discharge status: Home / Self Care | Payer: Self-pay

## 2016-02-23 ENCOUNTER — Encounter (HOSPITAL_COMMUNITY): Payer: Self-pay | Admitting: Emergency Medicine

## 2016-02-23 ENCOUNTER — Inpatient Hospital Stay (HOSPITAL_COMMUNITY)
Admission: EM | Admit: 2016-02-23 | Discharge: 2016-02-26 | DRG: 638 | Disposition: A | Discharge status: Home / Self Care | Payer: Self-pay | Attending: Oncology | Admitting: Oncology

## 2016-02-23 DIAGNOSIS — E785 Hyperlipidemia, unspecified: Secondary | ICD-10-CM | POA: Diagnosis present

## 2016-02-23 DIAGNOSIS — E1010 Type 1 diabetes mellitus with ketoacidosis without coma: Principal | ICD-10-CM | POA: Diagnosis present

## 2016-02-23 DIAGNOSIS — Z880 Allergy status to penicillin: Secondary | ICD-10-CM

## 2016-02-23 DIAGNOSIS — I10 Essential (primary) hypertension: Secondary | ICD-10-CM | POA: Diagnosis present

## 2016-02-23 DIAGNOSIS — R7989 Other specified abnormal findings of blood chemistry: Secondary | ICD-10-CM

## 2016-02-23 DIAGNOSIS — J45909 Unspecified asthma, uncomplicated: Secondary | ICD-10-CM | POA: Diagnosis present

## 2016-02-23 DIAGNOSIS — I248 Other forms of acute ischemic heart disease: Secondary | ICD-10-CM | POA: Diagnosis present

## 2016-02-23 DIAGNOSIS — I428 Other cardiomyopathies: Secondary | ICD-10-CM | POA: Diagnosis present

## 2016-02-23 DIAGNOSIS — I208 Other forms of angina pectoris: Secondary | ICD-10-CM

## 2016-02-23 DIAGNOSIS — G43909 Migraine, unspecified, not intractable, without status migrainosus: Secondary | ICD-10-CM | POA: Diagnosis present

## 2016-02-23 DIAGNOSIS — N179 Acute kidney failure, unspecified: Secondary | ICD-10-CM | POA: Diagnosis present

## 2016-02-23 DIAGNOSIS — Z794 Long term (current) use of insulin: Secondary | ICD-10-CM

## 2016-02-23 DIAGNOSIS — Z9641 Presence of insulin pump (external) (internal): Secondary | ICD-10-CM

## 2016-02-23 DIAGNOSIS — E1039 Type 1 diabetes mellitus with other diabetic ophthalmic complication: Secondary | ICD-10-CM

## 2016-02-23 DIAGNOSIS — K529 Noninfective gastroenteritis and colitis, unspecified: Secondary | ICD-10-CM | POA: Diagnosis present

## 2016-02-23 DIAGNOSIS — R778 Other specified abnormalities of plasma proteins: Secondary | ICD-10-CM

## 2016-02-23 DIAGNOSIS — R011 Cardiac murmur, unspecified: Secondary | ICD-10-CM | POA: Diagnosis present

## 2016-02-23 DIAGNOSIS — Z7982 Long term (current) use of aspirin: Secondary | ICD-10-CM

## 2016-02-23 DIAGNOSIS — E876 Hypokalemia: Secondary | ICD-10-CM | POA: Diagnosis not present

## 2016-02-23 DIAGNOSIS — Z23 Encounter for immunization: Secondary | ICD-10-CM

## 2016-02-23 DIAGNOSIS — I200 Unstable angina: Secondary | ICD-10-CM

## 2016-02-23 DIAGNOSIS — R079 Chest pain, unspecified: Secondary | ICD-10-CM

## 2016-02-23 DIAGNOSIS — F329 Major depressive disorder, single episode, unspecified: Secondary | ICD-10-CM | POA: Diagnosis present

## 2016-02-23 DIAGNOSIS — Z8249 Family history of ischemic heart disease and other diseases of the circulatory system: Secondary | ICD-10-CM

## 2016-02-23 LAB — URINALYSIS, ROUTINE W REFLEX MICROSCOPIC
BACTERIA UA: NONE SEEN
Bilirubin Urine: NEGATIVE
Glucose, UA: >=500 mg/dL — AB
Hgb urine dipstick: NEGATIVE
Ketones, ur: 80 mg/dL — AB
Leukocytes, UA: NEGATIVE
Nitrite: NEGATIVE
PH: 5 (ref 5.0–8.0)
Protein, ur: NEGATIVE mg/dL
SPECIFIC GRAVITY, URINE: 1.021 (ref 1.005–1.030)
SQUAMOUS EPITHELIAL / LPF: NONE SEEN

## 2016-02-23 LAB — COMPREHENSIVE METABOLIC PANEL
ALBUMIN: 3.3 g/dL — AB (ref 3.5–5.0)
ALK PHOS: 101 U/L (ref 38–126)
ALT: 27 U/L (ref 14–54)
AST: 26 U/L (ref 15–41)
Anion gap: 26 — ABNORMAL HIGH (ref 5–15)
BUN: 33 mg/dL — AB (ref 6–20)
CO2: 14 mmol/L — ABNORMAL LOW (ref 22–32)
CREATININE: 2.02 mg/dL — AB (ref 0.44–1.00)
Calcium: 9.1 mg/dL (ref 8.9–10.3)
Chloride: 84 mmol/L — ABNORMAL LOW (ref 101–111)
GFR calc Af Amer: 32 mL/min — ABNORMAL LOW (ref >60)
GFR, EST NON AFRICAN AMERICAN: 28 mL/min — AB (ref >60)
GLUCOSE: 873 mg/dL — AB (ref 65–99)
Potassium: 5.2 mmol/L — ABNORMAL HIGH (ref 3.5–5.1)
Sodium: 124 mmol/L — ABNORMAL LOW (ref 135–145)
Total Bilirubin: 1.5 mg/dL — ABNORMAL HIGH (ref 0.3–1.2)
Total Protein: 6.6 g/dL (ref 6.5–8.1)

## 2016-02-23 LAB — CBC
HEMATOCRIT: 39.6 % (ref 36.0–46.0)
Hemoglobin: 13.1 g/dL (ref 12.0–15.0)
MCH: 29.2 pg (ref 26.0–34.0)
MCHC: 33.1 g/dL (ref 30.0–36.0)
MCV: 88.4 fL (ref 78.0–100.0)
PLATELETS: 251 10*3/uL (ref 150–400)
RBC: 4.48 MIL/uL (ref 3.87–5.11)
RDW: 13.9 % (ref 11.5–15.5)
WBC: 12.6 10*3/uL — ABNORMAL HIGH (ref 4.0–10.5)

## 2016-02-23 LAB — I-STAT BETA HCG BLOOD, ED (MC, WL, AP ONLY)

## 2016-02-23 MED ORDER — DEXTROSE-NACL 5-0.45 % IV SOLN
INTRAVENOUS | Status: DC
  Administered 2016-02-24: 12:00:00 via INTRAVENOUS

## 2016-02-23 MED ORDER — SODIUM CHLORIDE 0.9 % IV SOLN
INTRAVENOUS | Status: DC
  Administered 2016-02-24: 5.4 [IU]/h via INTRAVENOUS
  Filled 2016-02-23: qty 2.5

## 2016-02-23 MED ORDER — SODIUM CHLORIDE 0.9 % IV BOLUS (SEPSIS)
1000.0000 mL | Freq: Once | INTRAVENOUS | Status: AC
  Administered 2016-02-23: 1000 mL via INTRAVENOUS

## 2016-02-23 MED ORDER — ONDANSETRON HCL 4 MG/2ML IJ SOLN
4.0000 mg | Freq: Once | INTRAMUSCULAR | Status: AC
  Administered 2016-02-23: 4 mg via INTRAVENOUS
  Filled 2016-02-23: qty 2

## 2016-02-23 NOTE — ED Triage Notes (Signed)
Patient reports persistent emesis with diarrhea onset yesterday , denies fever or chills .

## 2016-02-24 ENCOUNTER — Encounter (HOSPITAL_COMMUNITY): Payer: Self-pay | Admitting: Internal Medicine

## 2016-02-24 DIAGNOSIS — R778 Other specified abnormalities of plasma proteins: Secondary | ICD-10-CM

## 2016-02-24 DIAGNOSIS — Z8249 Family history of ischemic heart disease and other diseases of the circulatory system: Secondary | ICD-10-CM

## 2016-02-24 DIAGNOSIS — N179 Acute kidney failure, unspecified: Secondary | ICD-10-CM

## 2016-02-24 DIAGNOSIS — Z800 Family history of malignant neoplasm of digestive organs: Secondary | ICD-10-CM

## 2016-02-24 DIAGNOSIS — E1010 Type 1 diabetes mellitus with ketoacidosis without coma: Principal | ICD-10-CM

## 2016-02-24 DIAGNOSIS — I10 Essential (primary) hypertension: Secondary | ICD-10-CM

## 2016-02-24 DIAGNOSIS — Z8042 Family history of malignant neoplasm of prostate: Secondary | ICD-10-CM

## 2016-02-24 DIAGNOSIS — I214 Non-ST elevation (NSTEMI) myocardial infarction: Secondary | ICD-10-CM

## 2016-02-24 DIAGNOSIS — Z841 Family history of disorders of kidney and ureter: Secondary | ICD-10-CM

## 2016-02-24 DIAGNOSIS — Z880 Allergy status to penicillin: Secondary | ICD-10-CM

## 2016-02-24 DIAGNOSIS — Z803 Family history of malignant neoplasm of breast: Secondary | ICD-10-CM

## 2016-02-24 DIAGNOSIS — R0789 Other chest pain: Secondary | ICD-10-CM

## 2016-02-24 DIAGNOSIS — K529 Noninfective gastroenteritis and colitis, unspecified: Secondary | ICD-10-CM

## 2016-02-24 DIAGNOSIS — Z8041 Family history of malignant neoplasm of ovary: Secondary | ICD-10-CM

## 2016-02-24 DIAGNOSIS — R079 Chest pain, unspecified: Secondary | ICD-10-CM

## 2016-02-24 DIAGNOSIS — F329 Major depressive disorder, single episode, unspecified: Secondary | ICD-10-CM

## 2016-02-24 DIAGNOSIS — E785 Hyperlipidemia, unspecified: Secondary | ICD-10-CM

## 2016-02-24 LAB — BASIC METABOLIC PANEL
ANION GAP: 27 — AB (ref 5–15)
ANION GAP: 26 — AB (ref 5–15)
ANION GAP: 9 (ref 5–15)
Anion gap: 11 (ref 5–15)
BUN: 38 mg/dL — ABNORMAL HIGH (ref 6–20)
BUN: 39 mg/dL — ABNORMAL HIGH (ref 6–20)
BUN: 39 mg/dL — ABNORMAL HIGH (ref 6–20)
BUN: 38 mg/dL — ABNORMAL HIGH (ref 6–20)
CALCIUM: 8.5 mg/dL — AB (ref 8.9–10.3)
CHLORIDE: 93 mmol/L — AB (ref 101–111)
CHLORIDE: 93 mmol/L — AB (ref 101–111)
CHLORIDE: 101 mmol/L (ref 101–111)
CO2: 10 mmol/L — AB (ref 22–32)
CO2: 11 mmol/L — AB (ref 22–32)
CO2: 24 mmol/L (ref 22–32)
CO2: 23 mmol/L (ref 22–32)
CREATININE: 2.01 mg/dL — AB (ref 0.44–1.00)
Calcium: 8.1 mg/dL — ABNORMAL LOW (ref 8.9–10.3)
Calcium: 8.2 mg/dL — ABNORMAL LOW (ref 8.9–10.3)
Calcium: 8.3 mg/dL — ABNORMAL LOW (ref 8.9–10.3)
Chloride: 103 mmol/L (ref 101–111)
Creatinine, Ser: 2.4 mg/dL — ABNORMAL HIGH (ref 0.44–1.00)
Creatinine, Ser: 2.36 mg/dL — ABNORMAL HIGH (ref 0.44–1.00)
Creatinine, Ser: 2 mg/dL — ABNORMAL HIGH (ref 0.44–1.00)
GFR calc Af Amer: 26 mL/min — ABNORMAL LOW (ref >60)
GFR calc Af Amer: 27 mL/min — ABNORMAL LOW (ref >60)
GFR calc Af Amer: 33 mL/min — ABNORMAL LOW (ref >60)
GFR calc Af Amer: 33 mL/min — ABNORMAL LOW (ref >60)
GFR calc non Af Amer: 23 mL/min — ABNORMAL LOW (ref >60)
GFR calc non Af Amer: 23 mL/min — ABNORMAL LOW (ref >60)
GFR calc non Af Amer: 28 mL/min — ABNORMAL LOW (ref >60)
GFR, EST NON AFRICAN AMERICAN: 28 mL/min — AB (ref >60)
GLUCOSE: 813 mg/dL — AB (ref 65–99)
GLUCOSE: 731 mg/dL — AB (ref 65–99)
GLUCOSE: 272 mg/dL — AB (ref 65–99)
GLUCOSE: 101 mg/dL — AB (ref 65–99)
POTASSIUM: 4.6 mmol/L (ref 3.5–5.1)
POTASSIUM: 4.4 mmol/L (ref 3.5–5.1)
POTASSIUM: 3.4 mmol/L — AB (ref 3.5–5.1)
Potassium: 4.3 mmol/L (ref 3.5–5.1)
Sodium: 130 mmol/L — ABNORMAL LOW (ref 135–145)
Sodium: 130 mmol/L — ABNORMAL LOW (ref 135–145)
Sodium: 136 mmol/L (ref 135–145)
Sodium: 135 mmol/L (ref 135–145)

## 2016-02-24 LAB — CBG MONITORING, ED
GLUCOSE-CAPILLARY: 454 mg/dL — AB (ref 65–99)
GLUCOSE-CAPILLARY: 322 mg/dL — AB (ref 65–99)
GLUCOSE-CAPILLARY: 274 mg/dL — AB (ref 65–99)
GLUCOSE-CAPILLARY: 130 mg/dL — AB (ref 65–99)
GLUCOSE-CAPILLARY: 80 mg/dL (ref 65–99)
Glucose-Capillary: 139 mg/dL — ABNORMAL HIGH (ref 65–99)
Glucose-Capillary: 204 mg/dL — ABNORMAL HIGH (ref 65–99)
Glucose-Capillary: 388 mg/dL — ABNORMAL HIGH (ref 65–99)
Glucose-Capillary: 408 mg/dL — ABNORMAL HIGH (ref 65–99)
Glucose-Capillary: 498 mg/dL — ABNORMAL HIGH (ref 65–99)
Glucose-Capillary: >600 mg/dL (ref 65–99)
Glucose-Capillary: >600 mg/dL (ref 65–99)
Glucose-Capillary: >600 mg/dL (ref 65–99)
Glucose-Capillary: >600 mg/dL (ref 65–99)

## 2016-02-24 LAB — GLUCOSE, CAPILLARY
Glucose-Capillary: 75 mg/dL (ref 65–99)
Glucose-Capillary: 252 mg/dL — ABNORMAL HIGH (ref 65–99)

## 2016-02-24 LAB — I-STAT TROPONIN, ED: TROPONIN I, POC: 0.25 ng/mL — AB (ref 0.00–0.08)

## 2016-02-24 LAB — MRSA PCR SCREENING: MRSA BY PCR: NEGATIVE

## 2016-02-24 LAB — TROPONIN I
TROPONIN I: 0.19 ng/mL — AB (ref <0.03)
TROPONIN I: 0.45 ng/mL — AB (ref <0.03)
TROPONIN I: 1.25 ng/mL — AB (ref <0.03)
TROPONIN I: 0.78 ng/mL — AB (ref <0.03)

## 2016-02-24 MED ORDER — INSULIN ASPART 100 UNIT/ML ~~LOC~~ SOLN
0.0000 [IU] | SUBCUTANEOUS | Status: DC
  Administered 2016-02-24: 5 [IU] via SUBCUTANEOUS
  Administered 2016-02-25: 9 [IU] via SUBCUTANEOUS
  Administered 2016-02-25: 5 [IU] via SUBCUTANEOUS

## 2016-02-24 MED ORDER — INFLUENZA VAC SPLIT QUAD 0.5 ML IM SUSY
0.5000 mL | PREFILLED_SYRINGE | INTRAMUSCULAR | Status: AC
Start: 2016-02-25 — End: 2016-02-25
  Administered 2016-02-25: 0.5 mL via INTRAMUSCULAR
  Filled 2016-02-24: qty 0.5

## 2016-02-24 MED ORDER — ASPIRIN 325 MG PO TABS
325.0000 mg | ORAL_TABLET | Freq: Once | ORAL | Status: AC
  Administered 2016-02-24: 325 mg via ORAL
  Filled 2016-02-24: qty 1

## 2016-02-24 MED ORDER — PROCHLORPERAZINE EDISYLATE 5 MG/ML IJ SOLN
10.0000 mg | Freq: Four times a day (QID) | INTRAMUSCULAR | Status: DC | PRN
  Administered 2016-02-24: 10 mg via INTRAVENOUS
  Filled 2016-02-24 (×2): qty 2

## 2016-02-24 MED ORDER — MORPHINE SULFATE (PF) 4 MG/ML IV SOLN
4.0000 mg | Freq: Once | INTRAVENOUS | Status: AC
  Administered 2016-02-24: 4 mg via INTRAVENOUS
  Filled 2016-02-24: qty 1

## 2016-02-24 MED ORDER — AMLODIPINE BESYLATE 5 MG PO TABS
5.0000 mg | ORAL_TABLET | Freq: Every day | ORAL | Status: DC
  Administered 2016-02-24 – 2016-02-25 (×2): 5 mg via ORAL
  Filled 2016-02-24 (×2): qty 1

## 2016-02-24 MED ORDER — POTASSIUM CHLORIDE CRYS ER 20 MEQ PO TBCR
40.0000 meq | EXTENDED_RELEASE_TABLET | Freq: Once | ORAL | Status: AC
  Administered 2016-02-24: 40 meq via ORAL
  Filled 2016-02-24: qty 2

## 2016-02-24 MED ORDER — INSULIN GLARGINE 100 UNIT/ML ~~LOC~~ SOLN
33.0000 [IU] | Freq: Two times a day (BID) | SUBCUTANEOUS | Status: DC
  Administered 2016-02-24 – 2016-02-26 (×4): 33 [IU] via SUBCUTANEOUS
  Filled 2016-02-24 (×6): qty 0.33

## 2016-02-24 MED ORDER — ENOXAPARIN SODIUM 40 MG/0.4ML ~~LOC~~ SOLN
40.0000 mg | Freq: Every day | SUBCUTANEOUS | Status: DC
  Administered 2016-02-24 – 2016-02-26 (×3): 40 mg via SUBCUTANEOUS
  Filled 2016-02-24 (×4): qty 0.4

## 2016-02-24 MED ORDER — SODIUM CHLORIDE 0.9 % IV SOLN
INTRAVENOUS | Status: AC
  Administered 2016-02-24: 02:00:00 via INTRAVENOUS

## 2016-02-24 MED ORDER — ALBUTEROL SULFATE (2.5 MG/3ML) 0.083% IN NEBU: 2.5000 mg | INHALATION_SOLUTION | Freq: Four times a day (QID) | RESPIRATORY_TRACT | Status: DC | PRN

## 2016-02-24 NOTE — Care Management Note (Signed)
Case Management Note  Patient Details  Name: Debra [name] MRN: [id number] Date of Birth: 1966/02/19  Subjective/Objective:                  From home./ 50 y.o. woman with PMH poorly-controlled T1DM, HTN, HLD, depression, asthma presenting with nausea/vomiting  Action/Plan: Follow for disposition needs./ Admit to INPATIENT; pt uninsured but has had GCCN orange card in the past.   Expected Discharge Date:  02/27/16               Expected Discharge Plan:  Home w Home Health Services  In-House Referral:  NA  Discharge planning Services  CM Consult  Status of Service:  In process, will continue to follow    Additional Comments:  Oletta Cohn, RN 02/24/2016, 11:01 AM

## 2016-02-24 NOTE — ED Notes (Signed)
Shown to dr.campos pt;s  troponi level

## 2016-02-24 NOTE — Consult Note (Addendum)
Cardiology Consult    Patient ID: Debra [name] MRN: [id number], DOB/AGE: Jul 29, 1965   Admit date: 02/23/2016 Date of Consult: 02/24/2016  Primary Physician: Nyra MarketGorica Svalina, MD Reason for Consult: Chest Pain Primary Cardiologist: [facility] to [facility] Requesting Provider: Dr. Cyndie ChimeGranfortuna   History of Present Illness    Debra [name] is a 50 y.o. female with past medical history of Type 1 DM, HTN, HLD, asthma, and normal cors by cath in 2007 who [facility]2016 for evaluation of vomiting and diarrhea.   Reports her symptoms started the day prior to presentation and symptoms worsened throughout the day. Was unable to keep down her medications, food, or liquids. Also reported having chest discomfort, therefore Cardiology is asked to see the patient.   In talking with her today, she reports having episodes of chest pain for the past several months. She describes this as a stabbing sensation along her sternum which can occur at rest or with exertion. Says it is made worse by taking a deep breath. Episodes can last for a few seconds up to 10 minutes. Reports associated dyspnea, granted she notes having dyspnea at baseline secondary to her asthma.  While in the ED, her initial labs showed a WBC of 12.6, Hgb 13.1, and platelets 251. Na+ 124, K+ 5.2, glucose 873, and creatinine 2.02 (was 0.91 at last check). Anion Gap 26. Initial troponin at 0.25 with repeat values at 0.19, 0.45, and 1.25. EKG shows NSR, HR 71, with mild ST depression in the lateral leads.   She has been admitted with DKA and started on a Glucose stabilizer with most recent glucose reading of 80. Creatinine remains elevated at 2.00. She reports her nausea, vomiting, and diarrhea have fully resolved. Still having episodes of chest discomfort lasting for up to 30 seconds, which are made worse by her turning from side to side.    Past Medical History   Past Medical History:  Diagnosis Date  . Allergy    seasonal   . Anal fissure   . Anemia    2005  . Anginal pain (HCC)   . Anxiety    1990  . Arthritis   . Asthma    2000  . Cataract   . Depression 10/16/2012  . Diabetes mellitus    type i juvenille; 1988  . Difficult intubation    narrow airway  . Family history of malignant neoplasm of gastrointestinal tract   . GERD (gastroesophageal reflux disease)   . Heart murmur    Birth  . Hx of adenomatous colonic polyps   . Hyperlipidemia    2005  . Hypertension    1998  . Internal hemorrhoids 04/24/06   on colonoscopy  . Mental disorder    Depression 1990  . Migraine   . Neuromuscular disorder (HCC)    Neuropathy - hands/feet  . PONV (postoperative nausea and vomiting)   . RLS (restless legs syndrome)   . Sleep paralysis   . Staphylococcus aureus infection    toe  . Stroke (HCC)    in 1990    Past Surgical History:  Procedure Laterality Date  . bilateral foot surgery    . BREAST SURGERY     biopsy left breast  . CARDIAC CATHETERIZATION     around 2007 or 2008  . CESAREAN SECTION    . CHOLECYSTECTOMY    . COLONOSCOPY W/ BIOPSIES AND POLYPECTOMY    . DILATION AND CURETTAGE OF UTERUS  12/29/2010  Procedure: DILATATION AND CURETTAGE (D&C);  Surgeon: Tereso Newcomer, MD;  Location: [facility];  Service: Gynecology;;  . ENDOMETRIAL ABLATION W/ NOVASURE     Nov 2011  . EYE SURGERY    . HYSTEROSCOPY  12/29/2010   Procedure: HYSTEROSCOPY WITH HYDROTHERMAL ABLATION;  Surgeon: Tereso Newcomer, MD;  Location: [facility];  Service: Gynecology;;  . IUD REMOVAL  12/29/2010   Procedure: INTRAUTERINE DEVICE (IUD) REMOVAL;  Surgeon: Tereso Newcomer, MD;  Location: [facility];  Service: Gynecology;  Laterality: N/A;  . LAPAROSCOPIC TUBAL LIGATION  12/29/2010   Procedure: LAPAROSCOPIC TUBAL LIGATION;  Surgeon: Tereso Newcomer, MD;  Location: [facility];  Service: Gynecology;  Laterality: Bilateral;  . ORIF ANKLE FRACTURE Right 10/09/2013   Procedure: Open reduction internal fixation right ankle;  Surgeon:  Mable Paris, MD;  Location: [facility] OR;  Service: Orthopedics;  Laterality: Right;  Open reduction internal fixation right ankle  . TRIGGER FINGER RELEASE     x 3     Allergies  Allergies  Allergen Reactions  . Penicillins Nausea And Vomiting and Rash    Inpatient Medications    . enoxaparin (LOVENOX) injection  40 mg Subcutaneous Daily    Family History    Family History  Problem Relation Age of Onset  . Hypertension Mother   . Kidney disease Mother   . Hypertension Father   . Breast cancer Maternal Grandmother   . Prostate cancer Maternal Grandfather   . Ovarian cancer Paternal Grandmother   . Prostate cancer Paternal Grandfather   . Colon cancer Maternal Uncle     Social History    Social History   Social History  . Marital status: Divorced    Spouse name: N/A  . Number of children: 2  . Years of education: 14   Occupational History  . Unemployed    Social History Main Topics  . Smoking status: Never Smoker  . Smokeless tobacco: Never Used  . Alcohol use No  . Drug use: No     Comment: drug addict  . Sexual activity: Yes    Birth control/ protection: IUD   Other Topics Concern  . Not on file   Social History Narrative   Lost medicaid about 2009 ish when her youngest child turned 35. Has adult children. Lives with boyfriend who financially supports her. Has attempted to get disability but has been turned down.      2-3 caffeine drinks daily      Review of Systems    General:  No chills, fever, night sweats or weight changes.  Cardiovascular:  No edema, orthopnea, palpitations, paroxysmal nocturnal dyspnea. Positive for chest pain and dyspnea on exertion.  Dermatological: No rash, lesions/masses Respiratory: No cough, dyspnea Urologic: No hematuria, dysuria Abdominal:   No right red blood per rectum, melena, or hematemesis. Positive for nausea, vomiting, and diarrhea.  Neurologic:  No visual changes, wkns, changes in mental status. All  other systems reviewed and are otherwise negative except as noted above.  Physical Exam    Blood pressure 157/77, pulse 86, temperature 98 F (36.7 C), temperature source Oral, resp. rate 12, height 5\' 4"  (1.626 m), SpO2 100 %.  General: Pleasant, African American female appearing in NAD Psych: Normal affect. Neuro: Alert and oriented X 3. Moves all extremities spontaneously. HEENT: Normal  Neck: Supple without bruits or JVD. Lungs:  Resp regular and unlabored, CTA without wheezing or rales. Heart: RRR no s3, s4, 2/6 SEM best appreciated at RUSB. Abdomen: Soft,  non-tender, non-distended, BS + x 4.  Extremities: No clubbing, cyanosis or edema. DP/PT/Radials 2+ and equal bilaterally.  Labs    Troponin [facility] Test)  Recent Labs  02/24/16 0303  TROPIPOC 0.25*    Recent Labs  02/24/16 0436 02/24/16 0715 02/24/16 1400  TROPONINI 0.19* 0.45* 1.25*   Lab Results  Component Value Date   WBC 12.6 (H) 02/23/2016   HGB 13.1 02/23/2016   HCT 39.6 02/23/2016   MCV 88.4 02/23/2016   PLT 251 02/23/2016    Recent Labs Lab 02/23/16 2052  02/24/16 1042  NA 124*  < > 136  K 5.2*  < > 3.4*  CL 84*  < > 103  CO2 14*  < > 24  BUN 33*  < > 39*  CREATININE 2.02*  < > 2.00*  CALCIUM 9.1  < > 8.3*  PROT 6.6  --   --   BILITOT 1.5*  --   --   ALKPHOS 101  --   --   ALT 27  --   --   AST 26  --   --   GLUCOSE 873*  < > 272*  < > = values in this interval not displayed. Lab Results  Component Value Date   CHOL 184 04/21/2015   HDL 49 04/21/2015   LDLCALC 112 (H) 04/21/2015   TRIG 116 04/21/2015   Lab Results  Component Value Date   DDIMER 0.52 (H) 07/14/2011     Radiology Studies    No results found.  EKG & Cardiac Imaging    EKG:  NSR, HR 71, with mild ST depression in the lateral leads.   Echocardiogram: None on File  Cardiac Catheterization: 06/2005 ANGIOGRAPHIC DATA:  Left main coronary was an angiographically normal short  vessel which bifurcated into  an LAD and left circumflex system.   The LAD gave rise to several diagonal branches and several septal  perforating arteries.  The LAD wrapped around the apex and was  angiographically normal.   The circumflex vessel gave rise to two marginal vessels and was  angiographically normal.   The right coronary was angiographically normal and gave rise to a PDA system  as well as a marginal branch.   Biplane left ventriculography revealed normal contractility and there was a  suggestion of left ventricular hypertrophy concentrically.  There were no  focal segmental wall motion abnormalities.   Distal aortography revealed a smooth abdominal infrarenal aorta and iliac  system.  Renal arteries were widely patent without evidence for renal  stenosis or fibromuscular dysplasia.   IMPRESSION:  1.  Concentric left ventricular hypertrophy with normal left ventricular      systolic function.  2.  Normal coronary arteries.  3.  No evidence for renal artery stenosis.   The patient did receive labetalol 20 mg at the completion of the study prior  to pulling the sheath.  Medications will be adjusted for maximal  hypertension control in this type 1 diabetic female.    Assessment & Plan    1. Atypical Chest Pain - reports episodes of chest pain for the past several months which she describes as a stabbing sensation along her sternum which can occur at rest or with exertion. Says it is made worse by taking a deep breath and turning from side to side. - overall her symptoms sound atypical for a cardiac etiology but with her risk factors, elevated troponin values, and EKG showing mild ST depression in the lateral leads,  will plan for a nuclear stress test as the initial evaluation for ischemia. She is not a current cath candidate with her AKI.   - will tentatively schedule for tomorrow. NPO after midnight.   2. Elevated Troponin: Etiology could be NSTEMI given stress of DKA and long standing  DM1. - initial troponin at 0.25 with repeat values at 0.19, 0.45, and 1.25. - likely secondary to demand ischemia in the setting of her AKI and DKA. Plan for ischemic evaluation as above.   3. HTN - BP variable at 116/49 - 184/89 in the past 24 hours. - PTA Olmesartan-Amlodipine-HCTZ and Spironolactone held in the setting of AKI. Will restart Amlodipine at 5mg  daily with elevated readings  4. Systolic Murmur - will obtain echocardiogram to further evaluate.   5. AKI - creatinine normal at 0.91 ten months ago. Elevated to 2.02 on admission, with peak of 2.40. - trending down to 2.00 on most recent check. Agree with holding ARB, HCTZ, and Spironolactone in the setting of her AKI.    Signed, Ellsworth Lennox, PA-C 02/24/2016, 3:21 PM Pager: [phone] The patient has been seen in conjunction with Randall An, PAC. All aspects of care have been considered and discussed. The patient has been personally interviewed, examined, and all clinical data has been reviewed.   Atypical chest pain. Remote h/o exertional chest pain but "normal coronary arteries" 2007.  Systolic murmur is audible. Likely LV outflow.  Need to exclude high risk CAD with myocardial perfusion study (since has acute kidney injury). Echo also needs to be done to assess etiology of heart murmur.

## 2016-02-24 NOTE — ED Notes (Signed)
  CBG HI 

## 2016-02-24 NOTE — ED Notes (Signed)
Pt moved to hospital bed.

## 2016-02-24 NOTE — Progress Notes (Signed)
  Subjective: Debra [name] feels that her symptoms of nausea, chest pain, and headache have improved significantly. She is free of any new complaints.   Objective:  Vital signs in last 24 hours: Vitals:   02/24/16 1100 02/24/16 1115 02/24/16 1130 02/24/16 1200  BP: 174/86 172/85 156/64 164/77  Pulse: 91 92    Resp: 13 11 13 20   Temp:      TempSrc:      SpO2: 99% 100%    Height:       Physical Exam  Constitutional: She is oriented to person, place, and time. She appears well-developed and well-nourished. No distress.  HENT:  Head: Normocephalic and atraumatic.  Cardiovascular: Normal rate and regular rhythm.   Murmur heard. Systolic murmur   Pulmonary/Chest: Effort normal. No respiratory distress. She has no wheezes. She has no rales.  Abdominal: Soft. Bowel sounds are normal. She exhibits no distension. There is no tenderness.  Neurological: She is alert and oriented to person, place, and time.  Skin: She is not diaphoretic.    Assessment/Plan: Pt is a 49 y.o. yo female with a PMHx of insulin dependent type 2 DM, HTN, HLD, depression, and asthma  who was admitted on 02/23/2016 with symptoms of n/v, chest pain, and polydipsia which was determined to be secondary to diabetic ketoacidosis.   Active Problems:   DKA (diabetic ketoacidoses) (HCC)  DKA  On presentation she had stable vitals with an anion gap 26, bicarb 14. Her symptoms of nausea and vomiting have resolved and her gap has closed x1. Will continue gluco stabilizer protocol when her gap has closed x2 will restart her home insulin regiment - lantus 33 units BID and novolog 20 units TID with meals and start a diabetic diet.  -follow up BMP   -admit to stepdown or telemetry if gap has closed and she no longer requires insulin drip   Elevated troponin On initial presentation she described chest and jaw pain. She had troponin 0.25 > 0.19> 0.45. Initial EKG showed q waves but no acute ST changes. Her chest pain has  resolved but her worsening troponin is concerning.  - continue to trend troponin  - consulted cardiology, we appreciate their recommendations  - repeat EKG   Hypokalemia  K 5.2 > > 3.4 on insulin drip. Repleated with K-Dur 40 meq  -Follow up BMP    AKI  Crt improving 2.0 from baseline 0.8. Most likely pre renal secondary to her nausea vomiting and poor PO intake.   HTN  Can resume home medications Spironolactone and olmesartan-amlodipine-HCTZ tomorrow after evaluation of AKI and she is no longer NPO.   Dispo: Anticipated discharge in approximately 1-2 day(s).   Eulah Pont, MD 02/24/2016, 1:48 PM Pager: [phone]

## 2016-02-24 NOTE — ED Notes (Addendum)
Contact Information  Hessie Diener (boyfriend) [phone]

## 2016-02-24 NOTE — H&P (Signed)
Date: 02/24/2016               Patient Name:  Debra [name] MRN: [id number]  DOB: 11-06-1965 Age / Sex: 50 y.o., female   PCP: Nyra Market, MD         Medical Service: Internal Medicine Teaching Service         Attending Physician: Dr. Riley Churches    First Contact: Dr. Alm Bustard Pager: [phone]  Second Contact: Dr. Gwynn Burly Pager: [phone]       After Hours (After 5p/  First Contact Pager: [phone]  weekends / holidays): Second Contact Pager: [phone]   Chief Complaint: Nausea, vomiting  History of Present Illness: Debra [name] is a 50 y.o. woman with PMH poorly-controlled T1DM, HTN, HLD, depression, asthma presenting with nausea/vomiting that started yesterday (12/26) afternoon around 3pm. She reports these symptoms progressed to the point where she could not keep any food, liquids, or medications down. She also reports headache, dizziness, jaw pain, polyuria, polydipsia, and chest pain. She reports her chest pain has been constant since yesterday, is worse with exertion, and coincides with shortness of breath. However she localizes her chest pain by saying "it hurts all over my skin, touching the sheets makes it worse." She reports taking her insulin, but also that she has been out of test strips for months and has not been checking her blood sugar. She told the ED provider that her glucose typically runs above 800. She reports taking Lantus 40 units BID and Humalog 30 units with breakfast and dinner - however she is prescribed Lantus 34 BID and sliding scale Humalog 20-36 TID. She has chronic diarrhea and reports "40 to 50" BMs daily. She denies cough, dysuria, abdominal pain, focal weakness, or fevers/chills.  In the ED - vitals T 98, HR 91, RR 17, Bp 166/70, SpO2 99% on room air and diaphoretic and actively vomiting on exam. Labs remarkable for glucose 873, ketonuria, and anion gap 26 consistent with DKA. Additionally - Na 124, K 5.2, CO2 14, Cr 2.02, on CBC  leukocytosis to 12.6. She received 2x 1L NS bolus, IV Zofran, and was started on insulin drip. IMTS contacted for admission.  Meds:  No current facility-administered medications on file prior to encounter.    Current Outpatient Prescriptions on File Prior to Encounter  Medication Sig Dispense Refill  . acetaminophen-codeine (TYLENOL #3) 300-30 MG per tablet Take 1 tablet by mouth every 6 (six) hours as needed for moderate pain. 30 tablet 0  . albuterol (PROVENTIL HFA;VENTOLIN HFA) 108 (90 BASE) MCG/ACT inhaler Inhale 1-2 puffs into the lungs every 6 (six) hours as needed for wheezing or shortness of breath. 1 Inhaler 3  . Almotriptan Malate (AXERT PO) Take 1 tablet by mouth as needed.    Marland Kitchen aspirin EC 81 MG tablet Take 81 mg by mouth daily.    Marland Kitchen atorvastatin (LIPITOR) 40 MG tablet Take 1 tablet (40 mg total) by mouth daily. 30 tablet 6  . 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 The patient tests 4 times per day. 1 Device 0  . desloratadine (CLARINEX) 5 MG tablet Take 1 tablet (5 mg total) by mouth daily. 90 tablet 5  . DULoxetine (CYMBALTA) 60 MG capsule Take 1 capsule (60 mg total) by mouth daily. 30 capsule 5  . glucose blood test strip Use as instructed. The patient is insulin requiring, ICD 10 code E10.9. The  patient tests 4 times per day. 100 each 12  . insulin aspart (NOVOLOG FLEXPEN) 100 UNIT/ML FlexPen Inject 20 Units into the skin 3 (three) times daily with meals. Up to 36 units each dose 15 mL 11  . insulin glargine (LANTUS) 100 unit/mL SOPN Inject 0.33 mLs (33 Units total) into the skin 2 (two) times daily. 15 mL 11  . Insulin Pen Needle 32G X 4 MM MISC Use to inject insulin twice daily 100 each 5  . loperamide (IMODIUM) 2 MG capsule Take 1 capsule (2 mg total) by mouth 3 (three) times daily before meals. Take 45 minutes before a meal 90 capsule 2  . nitroGLYCERIN (NITROSTAT) 0.4 MG SL tablet Place 0.4 mg  under the tongue every 5 (five) minutes as needed for chest pain.    . Olmesartan-Amlodipine-HCTZ (TRIBENZOR) 40-10-25 MG TABS Take 1 tablet by mouth daily. 90 tablet 3  . spironolactone (ALDACTONE) 25 MG tablet Take 1 tablet (25 mg total) by mouth daily. 30 tablet 6  . Vitamin D, Ergocalciferol, (DRISDOL) 50000 units CAPS capsule Take 1 capsule (50,000 Units total) by mouth every 7 (seven) days. 8 capsule 0   Allergies: Allergies as of 02/23/2016 - Review Complete 02/23/2016  Allergen Reaction Noted  . Penicillins Nausea And Vomiting and Rash 07/06/2010   Past Medical History:  Diagnosis Date  . Allergy    seasonal  . Anal fissure   . Anemia    2005  . Anginal pain (HCC)   . Anxiety    1990  . Arthritis   . Asthma    2000  . Cataract   . Depression 10/16/2012  . Diabetes mellitus    type i juvenille; 1988  . Difficult intubation    narrow airway  . Family history of malignant neoplasm of gastrointestinal tract   . GERD (gastroesophageal reflux disease)   . Heart murmur    Birth  . Hx of adenomatous colonic polyps   . Hyperlipidemia    2005  . Hypertension    1998  . Internal hemorrhoids 04/24/06   on colonoscopy  . Mental disorder    Depression 1990  . Migraine   . Neuromuscular disorder (HCC)    Neuropathy - hands/feet  . PONV (postoperative nausea and vomiting)   . RLS (restless legs syndrome)   . Sleep paralysis   . Staphylococcus aureus infection    toe  . Stroke [facility](HCC)    in [id number]   Family History:  Family History  Problem Relation Age of Onset  . Hypertension Mother   . Kidney disease Mother   . Hypertension Father   . Breast cancer Maternal Grandmother   . Prostate cancer Maternal Grandfather   . Ovarian cancer Paternal Grandmother   . Prostate cancer Paternal Grandfather   . Colon cancer Maternal Uncle    Social History:  Social History   Social History  . Marital status: Divorced    Spouse name: N/A  . Number of children: 2  . Years of  education: 14   Occupational History  . Unemployed    Social History Main Topics  . Smoking status: Never Smoker  . Smokeless tobacco: Never Used  . Alcohol use No  . Drug use: No     Comment: drug addict  . Sexual activity: Yes    Birth control/ protection: IUD   Other Topics Concern  . Not on file   Social History Narrative   Lost medicaid about 2009 ish when her youngest child  turned 18. Has adult children. Lives with boyfriend who financially supports her. Has attempted to get disability but has been turned down.      2-3 caffeine drinks daily     Review of Systems: A complete ROS was negative except as per HPI.   Physical Exam: Blood pressure (!) 131/50, pulse 73, temperature 98 F (36.7 C), temperature source Oral, resp. rate 16, height 5\' 4"  (1.626 m), SpO2 98 %.  General appearance: Tired-appearing woman laying in ER bed, sleeping but rouses to voice, in mild distress, mumbles answers and falls asleep during questioning, squirming in bed HENT: Normocephalic, atraumatic, dry mucous membranes, neck supple Eyes: PERRL, non-icteric Cardiovascular/Chest: Regular rate and rhythm, no murmurs, rubs, gallops, chest wall non-tender to palpation Respiratory: Clear to auscultation bilaterally, normal work of breathing Abdomen: Soft, generalized tenderness to deep palpation, non-distended Extremities: Normal bulk and range of motion, no edema, 2+ peripheral pulses Skin: Warm, dry, intact Neuro: Alert and oriented, spontaneously moving all extremities Psych: Oscillates between odd and anxious affect, soft mumbling speech, several instances of staring blankly instead of answering questions  EKG: NSR, probable old anteroseptal infarct  Assessment & Plan by Problem: Active Problems:   DKA (diabetic ketoacidoses) (HCC)  Diabetic ketoacidosis, in poorly-controlled type 1 diabetic - last A1c 13.8 in 03/2015, poor compliance with insulin regimen and has not checked her blood glucose  in months - insufficient insulin most likely etiology. No evidence of infectious trigger (UA without evidence of UTI, no cough, fevers). Ischemic etiology a concern given complaint of chest and jaw pain - however EKG with no ischemic changes, troponin pending. Currently on continuous insulin infusion  - DKA protocol - Insulin gtt - CBG Q1H, BMP Q4H - S/p 2L normal saline bolus, maintenance NS while CBG > 250 - Begin D5 1/2NS when CBG < 250 - Compazine 10mg  IV Q6H prn for nausea  AKI, creatinine up to 2.4 from baseline 0.8, BUN 33-38, likely pre-renal due to N/V/poor po intake - IV fluids and trend BMP per above  Chest pain, atypical, since onset of her other symptoms on 12/26, reports worse with exertion and coincides with shortness of breath, dizzines and nausea - check EKG and serial troponins  HTN, currently normotensive, with AKI, currently NPO - hold home Spironolactone and Olmesartan-Amlodipine-HCTZ for now  HLD - hold home Atorvastatin while NPO  Depression - hold home Cymbalta while NPO  Chronic diarrhea, unclear etiology, pancreatic insufficiency (?) - hold home Loperimide while NPO  FEN/GI: NPO, replete electrolytes as needed  DVT ppx: Lovenox  Code status: Full code  Dispo: Admit patient to Observation with expected length of stay less than 2 midnights.  Signed: Althia Forts, MD 02/24/2016, 2:43 AM  Pager: [phone]

## 2016-02-24 NOTE — ED Provider Notes (Signed)
[facility]-EMERGENCY DEPT Provider Note   CSN: [id number] Arrival date & time: 02/23/16  2001  History   Chief Complaint Chief Complaint  Patient presents with  . Emesis  . Diarrhea   HPI Debra [name] is a 49 y.o. female.  HPI   Patient with multiple PMH  Comes to the ER with nausea/vomiting and diarrhea that started yesterday. She denies abdominal pains. While in triage labs were ordered and resulted prior to her being brought back into the room. Her glucose is greater than 870. She reports being compliant with medications at home but admits she does not check her glucose. She reports that her glucose typically does run > 800. She also has headache. Denies fever, back pain, confusion, SOB, CP, change in vision.  Past Medical History:  Diagnosis Date  . Allergy    seasonal  . Anal fissure   . Anemia    2005  . Anginal pain (HCC)   . Anxiety    1990  . Arthritis   . Asthma    2000  . Cataract   . Depression 10/16/2012  . Diabetes mellitus    type i juvenille; 1988  . Difficult intubation    narrow airway  . Family history of malignant neoplasm of gastrointestinal tract   . GERD (gastroesophageal reflux disease)   . Heart murmur    Birth  . Hx of adenomatous colonic polyps   . Hyperlipidemia    2005  . Hypertension    1998  . Internal hemorrhoids 04/24/06   on colonoscopy  . Mental disorder    Depression 1990  . Migraine   . Neuromuscular disorder (HCC)    Neuropathy - hands/feet  . PONV (postoperative nausea and vomiting)   . RLS (restless legs syndrome)   . Sleep paralysis   . Staphylococcus aureus infection    toe  . Stroke [facility])    in 1990    Patient Active Problem List   Diagnosis Date Noted  . DKA (diabetic ketoacidoses) (HCC) 02/24/2016  . Cellulitis of finger 04/16/2015  . Vitamin D deficiency 08/31/2014  . Macular edema due to secondary diabetes (HCC) 07/24/2014  . History of fracture of right ankle 10/06/2013  . Hyperlipidemia 10/06/2013  .  Healthcare maintenance 11/08/2012  . Depression 10/16/2012  . Bilateral knee pain 02/16/2012  . Chronic diarrhea 10/19/2010  . Peripheral neuropathy (HCC) 08/19/2010  . 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 Hypertension 07/06/2010  . Asthma 07/06/2010  . Migraine 07/06/2010    Past Surgical History:  Procedure Laterality Date  . bilateral foot surgery    . BREAST SURGERY     biopsy left breast  . CARDIAC CATHETERIZATION     around 2007 or 2008  . CESAREAN SECTION    . CHOLECYSTECTOMY    . COLONOSCOPY W/ BIOPSIES AND POLYPECTOMY    . DILATION AND CURETTAGE OF UTERUS  12/29/2010   Procedure: DILATATION AND CURETTAGE (D&C);  Surgeon: Tereso Newcomer, MD;  Location: [facility] ORS;  Service: Gynecology;;  . ENDOMETRIAL ABLATION W/ NOVASURE     Nov 2011  . EYE SURGERY    . HYSTEROSCOPY  12/29/2010   Procedure: HYSTEROSCOPY WITH HYDROTHERMAL ABLATION;  Surgeon: Tereso Newcomer, MD;  Location: [facility] ORS;  Service: Gynecology;;  . IUD REMOVAL  12/29/2010   Procedure: INTRAUTERINE DEVICE (IUD) REMOVAL;  Surgeon: Tereso Newcomer, MD;  Location: [facility] ORS;  Service: Gynecology;  Laterality: N/A;  . LAPAROSCOPIC TUBAL LIGATION  12/29/2010  Procedure: LAPAROSCOPIC TUBAL LIGATION;  Surgeon: Tereso Newcomer, MD;  Location: [facility] ORS;  Service: Gynecology;  Laterality: Bilateral;  . ORIF ANKLE FRACTURE Right 10/09/2013   Procedure: Open reduction internal fixation right ankle;  Surgeon: Mable Paris, MD;  Location[facility];  Service: Orthopedics;  Laterality: Right;  Open reduction internal fixation right ankle  . TRIGGER FINGER RELEASE     x 3    OB History    Gravida Para Term Preterm AB Living   2 2 1 1  0 2   SAB TAB Ectopic Multiple Live Births   0 0 0 0 2       Home Medications    Prior to Admission medications   Medication Sig Start Date End Date Taking? Authorizing Provider  acetaminophen-codeine (TYLENOL #3) 300-30 MG per tablet Take 1 tablet by mouth  every 6 (six) hours as needed for moderate pain. 08/28/14   Otis Brace, MD  albuterol (PROVENTIL HFA;VENTOLIN HFA) 108 (90 BASE) MCG/ACT inhaler Inhale 1-2 puffs into the lungs every 6 (six) hours as needed for wheezing or shortness of breath. 11/19/14   Otis Brace, MD  Almotriptan Malate (AXERT PO) Take 1 tablet by mouth as needed.    Historical Provider, MD  aspirin EC 81 MG tablet Take 81 mg by mouth daily.    Historical Provider, MD  atorvastatin (LIPITOR) 40 MG tablet Take 1 tablet (40 mg total) by mouth daily. 04/22/15   Otis Brace, MD  Blood Glucose Monitoring Suppl (AGAMATRIX PRESTO PRO METER) DEVI The patient is insulin requiring, ICD 10 code E10.9. The patient tests 4 times per day. 04/22/15   Otis Brace, MD  desloratadine (CLARINEX) 5 MG tablet Take 1 tablet (5 mg total) by mouth daily. 11/12/14 11/12/15  Otis Brace, MD  DULoxetine (CYMBALTA) 60 MG capsule Take 1 capsule (60 mg total) by mouth daily. 12/08/14   Otis Brace, MD  glucose blood test strip Use as instructed. The patient is insulin requiring, ICD 10 code E10.9. The patient tests 4 times per day. 04/22/15   Otis Brace, MD  insulin aspart (NOVOLOG FLEXPEN) 100 UNIT/ML FlexPen Inject 20 Units into the skin 3 (three) times daily with meals. Up to 36 units each dose 04/22/15   Marjan Rabbani, MD  insulin glargine (LANTUS) 100 unit/mL SOPN Inject 0.33 mLs (33 Units total) into the skin 2 (two) times daily. 04/22/15   Otis Brace, MD  Insulin Pen Needle 32G X 4 MM MISC Use to inject insulin twice daily 04/29/14   Otis Brace, MD  loperamide (IMODIUM) 2 MG capsule Take 1 capsule (2 mg total) by mouth 3 (three) times daily before meals. Take 45 minutes before a meal 07/25/14   Otis Brace, MD  nitroGLYCERIN (NITROSTAT) 0.4 MG SL tablet Place 0.4 mg under the tongue every 5 (five) minutes as needed for chest pain.    Historical Provider, MD  Olmesartan-Amlodipine-HCTZ (TRIBENZOR) 40-10-25 MG TABS Take 1 tablet by  mouth daily. 04/22/15   Otis Brace, MD  spironolactone (ALDACTONE) 25 MG tablet Take 1 tablet (25 mg total) by mouth daily. 04/22/15   Otis Brace, MD  sulfamethoxazole-trimethoprim (BACTRIM DS) 800-160 MG tablet Take 1 tablet by mouth 2 (two) times daily. 04/14/15   Gust Rung, DO  Vitamin D, Ergocalciferol, (DRISDOL) 50000 units CAPS capsule Take 1 capsule (50,000 Units total) by mouth every 7 (seven) days. 04/16/15   Gust Rung, DO    Family History Family History  Problem Relation Age of Onset  .  Hypertension Mother   . Kidney disease Mother   . Hypertension Father   . Breast cancer Maternal Grandmother   . Prostate cancer Maternal Grandfather   . Ovarian cancer Paternal Grandmother   . Prostate cancer Paternal Grandfather   . Colon cancer Maternal Uncle     Social History Social History  Substance Use Topics  . Smoking status: Never Smoker  . Smokeless tobacco: Never Used  . Alcohol use No     Allergies   Penicillins   Review of Systems Review of Systems Review of Systems All other systems negative except as documented in the HPI. All pertinent positives and negatives as reviewed in the HPI.   Physical Exam Updated Vital Signs BP (!) 151/52   Pulse 84   Temp 98 F (36.7 C) (Oral)   Resp 21   Ht 5\' 4"  (1.626 m)   SpO2 100%   Physical Exam  Constitutional: She appears well-developed and well-nourished. She appears distressed.  Actively vomiting  HENT:  Head: Normocephalic and atraumatic.  Nose: Nose normal.  Mouth/Throat: Uvula is midline, oropharynx is clear and moist and mucous membranes are normal.  Eyes: Pupils are equal, round, and reactive to light.  Neck: Normal range of motion. Neck supple.  Cardiovascular: Normal rate and regular rhythm.   Pulmonary/Chest: Effort normal.  Abdominal: Soft. There is no tenderness.  No signs of abdominal distention  Musculoskeletal:  No LE swelling  Neurological: She is alert.  Acting at baseline    Skin: Skin is warm. No rash noted. She is diaphoretic.  Nursing note and vitals reviewed.   ED Treatments / Results  Labs (all labs ordered are listed, but only abnormal results are displayed) Labs Reviewed  COMPREHENSIVE METABOLIC PANEL - Abnormal; Notable for the following:       Result Value   Sodium 124 (*)    Potassium 5.2 (*)    Chloride 84 (*)    CO2 14 (*)    Glucose, Bld 873 (*)    BUN 33 (*)    Creatinine, Ser 2.02 (*)    Albumin 3.3 (*)    Total Bilirubin 1.5 (*)    GFR calc non Af Amer 28 (*)    GFR calc Af Amer 32 (*)    Anion gap 26 (*)    All other components within normal limits  CBC - Abnormal; Notable for the following:    WBC 12.6 (*)    All other components within normal limits  URINALYSIS, ROUTINE W REFLEX MICROSCOPIC - Abnormal; Notable for the following:    Color, Urine STRAW (*)    Glucose, UA >=500 (*)    Ketones, ur 80 (*)    All other components within normal limits  CBG MONITORING, ED - Abnormal; Notable for the following:    Glucose-Capillary >600 (*)    All other components within normal limits  I-STAT BETA HCG BLOOD, ED ([facility], [facility], [facility] ONLY)    EKG  EKG Interpretation None       Radiology No results found.  Procedures Procedures (including critical care time)  Medications Ordered in ED Medications  sodium chloride 0.9 % bolus 1,000 mL (1,000 mLs Intravenous New Bag/Given 02/23/16 2352)  sodium chloride 0.9 % bolus 1,000 mL (1,000 mLs Intravenous New Bag/Given 02/23/16 2351)  dextrose 5 %-0.45 % sodium chloride infusion ( Intravenous Hold 02/23/16 2355)  insulin regular (NOVOLIN R,HUMULIN R) 250 Units in sodium chloride 0.9 % 250 mL (1 Units/mL) infusion (5.4 Units/hr Intravenous New Bag/Given  02/24/16 0026)  ondansetron (ZOFRAN) injection 4 mg (4 mg Intravenous Given 02/23/16 2352)     Initial Impression / Assessment and Plan / ED Course  I have reviewed the triage vital signs and the nursing notes.  Pertinent labs & imaging  results that were available during my care of the patient were reviewed by me and considered in my medical decision making (see chart for details).  Clinical Course     CRITICAL CARE Performed by: Dorthula Matas Total critical care time: 35 minutes Critical care time was exclusive of separately billable procedures and treating other patients. Critical care was necessary to treat or prevent imminent or life-threatening deterioration. Critical care was time spent personally by me on the following activities: development of treatment plan with patient and/or surrogate as well as nursing, discussions with consultants, evaluation of patient's response to treatment, examination of patient, obtaining history from patient or surrogate, ordering and performing treatments and interventions, ordering and review of laboratory studies, ordering and review of radiographic studies, pulse oximetry and re-evaluation of patient's condition.  12:48am Patient in DKA, admitted to Internal Medicine residents to the stepdown unit, she has been started on the glucostabilizer. She continues to vomit and is diaphoretic but is awake, alert and oriented breathing well on her own.   Blood pressure (!) 151/52, pulse 84, temperature 98 F (36.7 C), temperature source Oral, resp. rate 21, height 5\' 4"  (1.626 m), SpO2 100 %.   Final Clinical Impressions(s) / ED Diagnoses   Final diagnoses:  Diabetic ketoacidosis without coma associated with type 1 diabetes mellitus [facility])    New Prescriptions New Prescriptions   No medications on file     Marlon Pel, Cordelia Poche 02/24/16 0048    Azalia Bilis, MD 02/24/16 [phone]

## 2016-02-24 NOTE — ED Notes (Signed)
Spoke with Dr. Obie Dredge about pt's CBG. Will continue monitoring CBG and insulin drip. Pt to remain NPO.

## 2016-02-24 NOTE — ED Notes (Signed)
Family at bedside. 

## 2016-02-24 NOTE — ED Notes (Addendum)
Paused insulin drip paused- CBG 80. Paged attending MD to notify about troponin and CBG. Awaiting call back

## 2016-02-24 NOTE — ED Notes (Signed)
Report given to hope RN

## 2016-02-24 NOTE — ED Notes (Signed)
Paged MD concerning CBG 204. Awaiting call back and further orders

## 2016-02-25 ENCOUNTER — Inpatient Hospital Stay (HOSPITAL_COMMUNITY): Payer: Self-pay

## 2016-02-25 ENCOUNTER — Encounter (HOSPITAL_COMMUNITY): Payer: Self-pay | Admitting: Internal Medicine

## 2016-02-25 DIAGNOSIS — R079 Chest pain, unspecified: Secondary | ICD-10-CM

## 2016-02-25 DIAGNOSIS — R072 Precordial pain: Secondary | ICD-10-CM

## 2016-02-25 DIAGNOSIS — R7989 Other specified abnormal findings of blood chemistry: Secondary | ICD-10-CM

## 2016-02-25 DIAGNOSIS — N179 Acute kidney failure, unspecified: Secondary | ICD-10-CM

## 2016-02-25 DIAGNOSIS — I5032 Chronic diastolic (congestive) heart failure: Secondary | ICD-10-CM

## 2016-02-25 DIAGNOSIS — I208 Other forms of angina pectoris: Secondary | ICD-10-CM

## 2016-02-25 DIAGNOSIS — R011 Cardiac murmur, unspecified: Secondary | ICD-10-CM

## 2016-02-25 DIAGNOSIS — E876 Hypokalemia: Secondary | ICD-10-CM

## 2016-02-25 DIAGNOSIS — I200 Unstable angina: Secondary | ICD-10-CM

## 2016-02-25 DIAGNOSIS — R748 Abnormal levels of other serum enzymes: Secondary | ICD-10-CM

## 2016-02-25 DIAGNOSIS — R778 Other specified abnormalities of plasma proteins: Secondary | ICD-10-CM

## 2016-02-25 LAB — BASIC METABOLIC PANEL
Anion gap: 6 (ref 5–15)
BUN: 33 mg/dL — AB (ref 6–20)
CALCIUM: 8.2 mg/dL — AB (ref 8.9–10.3)
CHLORIDE: 100 mmol/L — AB (ref 101–111)
CO2: 26 mmol/L (ref 22–32)
CREATININE: 1.7 mg/dL — AB (ref 0.44–1.00)
GFR, EST AFRICAN AMERICAN: 40 mL/min — AB (ref >60)
GFR, EST NON AFRICAN AMERICAN: 35 mL/min — AB (ref >60)
Glucose, Bld: 270 mg/dL — ABNORMAL HIGH (ref 65–99)
Potassium: 3.9 mmol/L (ref 3.5–5.1)
SODIUM: 132 mmol/L — AB (ref 135–145)

## 2016-02-25 LAB — NM MYOCAR MULTI W/SPECT W/WALL MOTION / EF
CHL CUP MPHR: 172 {beats}/min
CHL CUP NUCLEAR SSS: 5
CHL CUP RESTING HR STRESS: 76 {beats}/min
CHL CUP STRESS STAGE 2 GRADE: 0 %
CHL CUP STRESS STAGE 3 DBP: 88 mmHg
CHL CUP STRESS STAGE 3 GRADE: 0 %
CHL CUP STRESS STAGE 3 HR: 96 {beats}/min
CHL CUP STRESS STAGE 4 DBP: 91 mmHg
CHL CUP STRESS STAGE 4 SBP: 176 mmHg
CSEPHR: 60 %
Exercise duration (min): 5 min
LV dias vol: 91 mL (ref 46–106)
LV sys vol: 55 mL
NUC STRESS TID: 1.18
RATE: 0.38
SDS: 5
SRS: 0
Stage 1 Grade: 0 %
Stage 1 HR: 81 {beats}/min
Stage 1 Speed: 0 mph
Stage 2 HR: 81 {beats}/min
Stage 2 Speed: 0 mph
Stage 3 SBP: 172 mmHg
Stage 3 Speed: 0 mph
Stage 4 Grade: 0 %
Stage 4 HR: 91 {beats}/min
Stage 4 Speed: 0 mph

## 2016-02-25 LAB — GLUCOSE, CAPILLARY
GLUCOSE-CAPILLARY: 127 mg/dL — AB (ref 65–99)
GLUCOSE-CAPILLARY: 184 mg/dL — AB (ref 65–99)
GLUCOSE-CAPILLARY: 395 mg/dL — AB (ref 65–99)
Glucose-Capillary: 279 mg/dL — ABNORMAL HIGH (ref 65–99)
Glucose-Capillary: 219 mg/dL — ABNORMAL HIGH (ref 65–99)
Glucose-Capillary: 293 mg/dL — ABNORMAL HIGH (ref 65–99)

## 2016-02-25 LAB — ECHOCARDIOGRAM COMPLETE
HEIGHTINCHES: 64 in
WEIGHTICAEL: 2709.01 [oz_av]

## 2016-02-25 LAB — TROPONIN I
TROPONIN I: 0.73 ng/mL — AB (ref <0.03)
TROPONIN I: 0.55 ng/mL — AB (ref <0.03)

## 2016-02-25 MED ORDER — INSULIN ASPART 100 UNIT/ML ~~LOC~~ SOLN: 0.0000 [IU] | Freq: Every day | SUBCUTANEOUS | Status: DC

## 2016-02-25 MED ORDER — REGADENOSON 0.4 MG/5ML IV SOLN
0.4000 mg | Freq: Once | INTRAVENOUS | Status: AC
  Administered 2016-02-25: 0.4 mg via INTRAVENOUS
  Filled 2016-02-25: qty 5

## 2016-02-25 MED ORDER — TECHNETIUM TC 99M TETROFOSMIN IV KIT
30.0000 | PACK | Freq: Once | INTRAVENOUS | Status: AC | PRN
  Administered 2016-02-25: 30 via INTRAVENOUS

## 2016-02-25 MED ORDER — TECHNETIUM TC 99M TETROFOSMIN IV KIT
10.0000 | PACK | Freq: Once | INTRAVENOUS | Status: AC | PRN
  Administered 2016-02-25: 10 via INTRAVENOUS

## 2016-02-25 MED ORDER — REGADENOSON 0.4 MG/5ML IV SOLN
INTRAVENOUS | Status: AC
  Filled 2016-02-25: qty 5

## 2016-02-25 MED ORDER — INSULIN ASPART 100 UNIT/ML ~~LOC~~ SOLN
0.0000 [IU] | Freq: Three times a day (TID) | SUBCUTANEOUS | Status: DC
  Administered 2016-02-25: 8 [IU] via SUBCUTANEOUS
  Administered 2016-02-25: 2 [IU] via SUBCUTANEOUS
  Administered 2016-02-26: 3 [IU] via SUBCUTANEOUS

## 2016-02-25 MED ORDER — AMLODIPINE BESYLATE 5 MG PO TABS
5.0000 mg | ORAL_TABLET | Freq: Once | ORAL | Status: AC
  Administered 2016-02-25: 5 mg via ORAL
  Filled 2016-02-25: qty 1

## 2016-02-25 MED ORDER — HYDRALAZINE HCL 20 MG/ML IJ SOLN
10.0000 mg | INTRAMUSCULAR | Status: DC | PRN
  Administered 2016-02-25 – 2016-02-26 (×2): 10 mg via INTRAVENOUS
  Filled 2016-02-25 (×2): qty 1

## 2016-02-25 MED ORDER — LOPERAMIDE HCL 2 MG PO CAPS
4.0000 mg | ORAL_CAPSULE | Freq: Once | ORAL | Status: AC
  Administered 2016-02-25: 4 mg via ORAL
  Filled 2016-02-25: qty 2

## 2016-02-25 MED ORDER — AMLODIPINE BESYLATE 10 MG PO TABS
10.0000 mg | ORAL_TABLET | Freq: Every day | ORAL | Status: DC
  Administered 2016-02-26: 10 mg via ORAL
  Filled 2016-02-25: qty 1

## 2016-02-25 NOTE — Progress Notes (Signed)
Nutrition Brief Note  Patient identified on the Malnutrition Screening Tool (MST) Report.  Pt has had a 7% weight loss since March 2017; not significant for time frame.  Wt Readings from Last 15 Encounters:  02/24/16 169 lb 5 oz (76.8 kg)  05/05/15 181 lb 8 oz (82.3 kg)  04/21/15 178 lb 4.8 oz (80.9 kg)  04/14/15 179 lb 11.2 oz (81.5 kg)  08/28/14 198 lb 12.8 oz (90.2 kg)  07/24/14 199 lb 9.6 oz (90.5 kg)  10/30/13 201 lb 9.6 oz (91.4 kg)  10/09/13 202 lb (91.6 kg)  10/01/13 210 lb (95.3 kg)  08/05/13 209 lb 1.6 oz (94.8 kg)  07/22/13 207 lb (93.9 kg)  06/09/13 208 lb 14.4 oz (94.8 kg)  12/09/12 213 lb 11.2 oz (96.9 kg)  11/08/12 210 lb (95.3 kg)  10/15/12 209 lb (94.8 kg)    Body mass index is 29.06 kg/m. Patient meets criteria for Overweight based on current BMI.   Current diet order is NPO. Labs and medications reviewed.   No nutrition interventions warranted at this time. If nutrition issues arise, please consult RD.   Maureen Chatters, RD, LDN Pager #: [phone] After-Hours Pager #: [phone]

## 2016-02-25 NOTE — Progress Notes (Signed)
  Subjective: Debra [name] is experiencing slight chest pressure, nausea, and left shoulder pain when seen after stress test this morning. She states that these symptoms of chest pressure and nausea are almost constant for her so at times she doesn't realize that they are there. She is ready to start eating today and says her stress test went well without complications.   Objective:  Vital signs in last 24 hours: Vitals:   02/25/16 0932 02/25/16 0934 02/25/16 0935 02/25/16 1100  BP: (!) 158/77 (!) 172/88 (!) 176/91   Pulse:      Resp:      Temp:    98.2 F (36.8 C)  TempSrc:    Oral  SpO2:      Weight:      Height:       Physical Exam  Constitutional: She is oriented to person, place, and time. She appears well-developed and well-nourished. No distress.  HENT:  Head: Normocephalic and atraumatic.  Cardiovascular: Normal rate and regular rhythm.   Murmur heard. Systolic murmur   Pulmonary/Chest: Effort normal. No respiratory distress. She has no wheezes. She has no rales.  Abdominal: Soft. Bowel sounds are normal. She exhibits no distension. There is no tenderness.  Neurological: She is alert and oriented to person, place, and time.  Skin: She is not diaphoretic.    Assessment/Plan: Pt is a 50 y.o. yo female with a PMHx of insulin dependent type 2 DM, HTN, HLD, depression, and asthma  who was admitted on 02/23/2016 with symptoms of n/v, chest pain, and polydipsia which was determined to be secondary to diabetic ketoacidosis.   Active Problems:   DKA (diabetic ketoacidoses) (HCC)   Chest pain at rest  Elevated troponin She describes chest pain, jaw pain, nausea, and diaphoresis which come and go frequently outpatient. On presentation she had trop peak 1.2 and was evaluated by cardiology and went for nuclear stress test today because ath could not be performed in the setting of AKI. When seen after stress test she reported mild chest pressure and nausea which is consistent with  what she experiences outpatient.  - follow up stress test results  - cardiology is following, we appreciate their recommendations  DKA  She is experiencing some nausea today but vomiting has resolved and her gap has closed x 2. Have resumed her home insulin lantus 33 units BID and she can eat at this time.  -Continue lantus 33 U BID and moderate ISS  -consult to care management for aid in difficulty affording medications   Systolic murmur  Heard on physical exam. Cardiology is following and recommended echo for eval.  -Follow up echocardiogram.   Hypokalemia  Repleated. K 3.9 today  -Follow up BMP    AKI  Crt improving 1.7 from baseline 0.8. Most likely pre renal secondary to her nausea vomiting and poor PO intake. Will avoid nephrotoxic meds. -follow up BMP   HTN  Currently hypertensive. Holding home medications Spironolactone and olmesartan-amlodipine-HCTZ in the setting of AKI. Cards is following and has started amlodipine 5 mg qd.   Dispo: Anticipated discharge in approximately 1-2 day(s).   Eulah Pont, MD 02/25/2016, 12:37 PM Pager: [phone] Physical Exam

## 2016-02-25 NOTE — Progress Notes (Signed)
Patient Name: Debra [name] Date of Encounter: 02/25/2016  Primary Cardiologist: Ellis Parents [facility] Problem List     Active Problems:   DKA (diabetic ketoacidoses) [facility])   Chest pain at rest     Subjective   Feeling ok this morning. Mild chest pressure.   Inpatient Medications    Scheduled Meds: . regadenoson      . amLODipine  5 mg Oral Daily  . enoxaparin (LOVENOX) injection  40 mg Subcutaneous Daily  . Influenza vac split quadrivalent PF  0.5 mL Intramuscular Tomorrow-1000  . insulin aspart  0-15 Units Subcutaneous TID WC  . insulin glargine  33 Units Subcutaneous BID   Continuous Infusions:  PRN Meds: albuterol, prochlorperazine   Vital Signs    Vitals:   02/25/16 0500 02/25/16 0600 02/25/16 0855 02/25/16 0932  BP: (!) 159/98 (!) 161/90 (!) 168/109 (!) 158/77  Pulse: 76 75    Resp: 11 (!) 0    Temp:      TempSrc:      SpO2: 100% 100%    Weight:      Height:        Intake/Output Summary (Last 24 hours) at 02/25/16 0933 Last data filed at 02/25/16 7494  Gross per 24 hour  Intake          1129.36 ml  Output                0 ml  Net          1129.36 ml   Filed Weights   02/24/16 1633  Weight: 169 lb 5 oz (76.8 kg)    Physical Exam    GEN: Well nourished, well developed AA female, in no acute distress.  HEENT: Grossly normal.  Neck: Supple, no JVD, carotid bruits, or masses. Cardiac: RRR, 2/6 systolic murmur, rubs, or gallops. No clubbing, cyanosis, edema.  Radials/DP/PT 2+ and equal bilaterally.  Respiratory:  Respirations regular and unlabored, clear to auscultation bilaterally. GI: Soft, nontender, nondistended, BS + x 4. MS: no deformity or atrophy. Skin: warm and dry, no rash. Neuro:  Strength and sensation are intact. Psych: AAOx3.  Normal affect.  Labs    CBC  Recent Labs  02/23/16 2052  WBC 12.6*  HGB 13.1  HCT 39.6  MCV 88.4  PLT 251   Basic Metabolic Panel  Recent Labs  02/24/16 1721 02/25/16 0439  NA 135 132*    K 4.3 3.9  CL 101 100*  CO2 23 26  GLUCOSE 101* 270*  BUN 38* 33*  CREATININE 2.01* 1.70*  CALCIUM 8.5* 8.2*   Liver Function Tests  Recent Labs  02/23/16 2052  AST 26  ALT 27  ALKPHOS 101  BILITOT 1.5*  PROT 6.6  ALBUMIN 3.3*   No results for input(s): LIPASE, AMYLASE in the last 72 hours. Cardiac Enzymes  Recent Labs  02/24/16 1400 02/24/16 2244 02/25/16 0439  TROPONINI 1.25* 0.78* 0.73*   BNP Invalid input(s): POCBNP D-Dimer No results for input(s): DDIMER in the last 72 hours. Hemoglobin A1C No results for input(s): HGBA1C in the last 72 hours. Fasting Lipid Panel No results for input(s): CHOL, HDL, LDLCALC, TRIG, CHOLHDL, LDLDIRECT in the last 72 hours. Thyroid Function Tests No results for input(s): TSH, T4TOTAL, T3FREE, THYROIDAB in the last 72 hours.  Invalid input(s): FREET3  Telemetry    SR - Personally Reviewed  ECG    SR with PVCs - Personally Reviewed  Radiology    No results found.  Cardiac  Studies   TTE pending  Patient Profile     50 y.o. female with past medical history of Type 1 DM, HTN, HLD, asthma, and normal cors by cath in 2007 who presented to [facility] ED on 02/23/2016 for evaluation of vomiting and diarrhea.   Assessment & Plan    1. Atypical Chest Pain - reports episodes of chest pain for the past several months which she describes as a stabbing sensation along her sternum which can occur at rest or with exertion. Says it is made worse by taking a deep breath and turning from side to side. - overall her symptoms sound atypical for a cardiac etiology but with her risk factors, elevated troponin values, and EKG showing mild ST depression in the lateral leads,she is not a current cath candidate with her AKI.   - seen in [facility]UC MED for Lexiscan myoview. Images pending.  2. Elevated Troponin: Etiology could be NSTEMI given stress of DKA and long standing DM1. -trop peaked at 1.25>> 0.73 today. - likely secondary to demand  ischemia in the setting of her AKI and DKA. Plan for ischemic evaluation as above.   3. HTN - BP variable at 116/49 - 184/89, has not received her morning medications yet. - PTA Olmesartan-Amlodipine-HCTZ and Spironolactone held in the setting of AKI. Will restart Amlodipine at 5mg  daily with elevated readings  4. Systolic Murmur - echo pending.  5. AKI - creatinine normal at 0.91 ten months ago. Elevated to 2.02 on admission, with peak of 2.40. - trending down to 1.7 on most recent check. Agree with holding ARB, HCTZ, and Spironolactone in the setting of her AKI.   Signed, Laverda Page, NP  02/25/2016, 9:33 AM   The patient has been seen in conjunction with Laverda Page, [facility]. All aspects of care have been considered and discussed. The patient has been personally interviewed, examined, and all clinical data has been reviewed.   The nuclear study does not reveal evidence of ischemia. LV function by echocardiography is low normal to mildly depressed in the 45-50% range rather than 39% as suggested by nuclear scintigraphy.  The elevated troponin is related to demand ischemia and not significant obstructive coronary disease or acute coronary syndrome. No further workup is needed at this time.

## 2016-02-25 NOTE — Progress Notes (Signed)
Notified cardiology NP that patient's BP on recheck with manual was 190/94 and patient has no PRNs or other schedule BP meds. Patient states she does not have chest pain and has no other complaints

## 2016-02-26 DIAGNOSIS — I200 Unstable angina: Secondary | ICD-10-CM

## 2016-02-26 LAB — BASIC METABOLIC PANEL
Anion gap: 9 (ref 5–15)
BUN: 19 mg/dL (ref 6–20)
CALCIUM: 8.3 mg/dL — AB (ref 8.9–10.3)
CHLORIDE: 97 mmol/L — AB (ref 101–111)
CO2: 25 mmol/L (ref 22–32)
CREATININE: 1.15 mg/dL — AB (ref 0.44–1.00)
GFR calc non Af Amer: 55 mL/min — ABNORMAL LOW (ref >60)
Glucose, Bld: 374 mg/dL — ABNORMAL HIGH (ref 65–99)
Potassium: 4.1 mmol/L (ref 3.5–5.1)
SODIUM: 131 mmol/L — AB (ref 135–145)

## 2016-02-26 LAB — GLUCOSE, CAPILLARY
GLUCOSE-CAPILLARY: 193 mg/dL — AB (ref 65–99)
GLUCOSE-CAPILLARY: 158 mg/dL — AB (ref 65–99)

## 2016-02-26 MED ORDER — INSULIN GLARGINE 100 UNITS/ML SOLOSTAR PEN: 33.0000 [IU] | PEN_INJECTOR | Freq: Two times a day (BID) | SUBCUTANEOUS | 0 refills | Status: DC

## 2016-02-26 MED ORDER — OLMESARTAN-AMLODIPINE-HCTZ 40-10-25 MG PO TABS: 1.0000 | ORAL_TABLET | Freq: Every day | ORAL | 0 refills | Status: DC

## 2016-02-26 MED ORDER — DULOXETINE HCL 60 MG PO CPEP: 60.0000 mg | ORAL_CAPSULE | Freq: Every day | ORAL | 0 refills | Status: DC

## 2016-02-26 MED ORDER — SPIRONOLACTONE 25 MG PO TABS: 25.0000 mg | ORAL_TABLET | Freq: Every day | ORAL | 0 refills | Status: DC

## 2016-02-26 MED ORDER — INSULIN ASPART 100 UNIT/ML ~~LOC~~ SOLN
0.0000 [IU] | Freq: Three times a day (TID) | SUBCUTANEOUS | Status: DC
  Administered 2016-02-26: 4 [IU] via SUBCUTANEOUS

## 2016-02-26 MED ORDER — ALBUTEROL SULFATE HFA 108 (90 BASE) MCG/ACT IN AERS: 1.0000 | INHALATION_SPRAY | Freq: Four times a day (QID) | RESPIRATORY_TRACT | 0 refills | Status: DC | PRN

## 2016-02-26 MED ORDER — ATORVASTATIN CALCIUM 40 MG PO TABS: 40.0000 mg | ORAL_TABLET | Freq: Every day | ORAL | 0 refills | Status: DC

## 2016-02-26 MED ORDER — ASPIRIN EC 81 MG PO TBEC: 81.0000 mg | DELAYED_RELEASE_TABLET | Freq: Every day | ORAL | 0 refills | Status: DC

## 2016-02-26 MED ORDER — INSULIN ASPART 100 UNIT/ML FLEXPEN: 20.0000 [IU] | PEN_INJECTOR | Freq: Three times a day (TID) | SUBCUTANEOUS | 0 refills | Status: DC

## 2016-02-26 NOTE — Progress Notes (Signed)
  Subjective: [name] says she is feeling good today and feels ready to go home. She has mild chest pressure but believes this may be related to her long history of asthma.   Objective:  Vital signs in last 24 hours: Vitals:   02/25/16 2003 02/26/16 0033 02/26/16 0416 02/26/16 0841  BP: (!) 183/83 (!) 175/89 (!) 160/66 (!) 185/86  Pulse: 84 82 85   Resp: 18  18   Temp: 98.4 F (36.9 C)  98.1 F (36.7 C)   TempSrc: Oral  Oral   SpO2: 100% 100% 98%   Weight:      Height:       Physical Exam  Constitutional: She is oriented to person, place, and time. She appears well-developed and well-nourished. No distress.  HENT:  Head: Normocephalic and atraumatic.  Cardiovascular: Normal rate and regular rhythm. No murmur.  Pulmonary/Chest: Effort normal. No respiratory distress. She has no wheezes. She has no rales.  Abdominal: Soft. Bowel sounds are normal. She exhibits no distension. There is no tenderness.  Neurological: She is alert and oriented to person, place, and time.  Skin: She is not diaphoretic.    Assessment/Plan: Pt is a 50 y.o. yo female with a PMHx of insulin dependent type 2 DM, HTN, HLD, depression, and asthma  who was admitted on 02/23/2016 with symptoms of n/v, chest pain, and polydipsia which was determined to be secondary to diabetic ketoacidosis.   Active Problems:   Diabetic ketoacidosis without coma associated with type 1 diabetes mellitus (HCC)   Chest pain at rest   AKI (acute kidney injury) (HCC)   Precordial pain   Elevated troponin   Unstable angina pectoris (HCC)  Elevated troponin Nuclear stress test performed yesterday was low risk. Cardiology feels that this elevated troponin is most likely secondary to demand ischemia and that she has no obstructive disease. She states she has a history of asthma for years and believes her chest tightness may be related to that history.   DKA  Resolved. Blood glucose has been in the 200s on current insulin  regiment  -increased to resistant ISS -Continue lantus 33 U BID and moderate ISS  -consult to care management for aid in difficulty affording medications   Systolic murmur  Heard on physical exam. Cardiology ecommended echocardiogram which showed normal EF with grade one diastolic dysfunction and no major valvular abnormalities.   Hypokalemia  Repleated. K 4.1 today   AKI  Crt improving 1.1 from baseline 0.8. Most likely pre renal secondary to her nausea vomiting and poor PO intake. Will reevaluate at outpatient follow up.   HTN  Currently hypertensive. Will resume home medications Spironolactone and olmesartan-amlodipine-HCTZ as her AKI is improving.   Dispo: Anticipated discharge today.   Eulah Pont, MD 02/26/2016, 11:19 AM Pager: [phone] Physical Exam

## 2016-02-26 NOTE — Discharge Instructions (Signed)
Thank you for trusting [place] with your medical care!  You were hospitalized for diabetic ketoacidosis and treated with insulin.   To make sure you are getting better, please make it to the follow-up appointments listed on the first page.  If you have any questions, please call [phone].

## 2016-02-26 NOTE — Discharge Summary (Signed)
Name: Debra [name] L Deyarmin MRN: [id number] DOB: August 11, 1965 50 y.o. PCP: Nyra MarketGorica Svalina, MD  Date of Admission: 02/23/2016 11:05 PM Date of Discharge: 02/26/2016 Attending Physician: Levert FeinsteinJames M Granfortuna, MD  Discharge Diagnosis: 1.    Diabetic ketoacidosis without coma associated with type 1 diabetes mellitus [facility](HCC)   Discharge Medications: Allergies as of 02/26/2016      Reactions   Penicillins Anaphylaxis, Nausea And Vomiting, Rash   Has patient had a PCN reaction causing immediate rash, facial/tongue/throat swelling, SOB or lightheadedness with hypotension: Yes Has patient had a PCN reaction causing severe rash involving mucus membranes or skin necrosis: Yes Has patient had a PCN reaction that required hospitalization No Has patient had a PCN reaction occurring within the last 10 years: Yes If all of the above answers are "NO", then may proceed with Cephalosporin use.   Tape Rash      Medication List    TAKE these medications   acetaminophen-codeine 300-30 MG tablet Commonly known as:  TYLENOL #3 Take 1 tablet by mouth every 6 (six) hours as needed for moderate pain.   AGAMATRIX PRESTO PRO METER Devi The patient is insulin requiring, ICD 10 code E10.9. The patient tests 4 times per day.   albuterol 108 (90 Base) MCG/ACT inhaler Commonly known as:  PROVENTIL HFA;VENTOLIN HFA Inhale 1-2 puffs into the lungs every 6 (six) hours as needed for wheezing or shortness of breath.   aspirin EC 81 MG tablet Take 1 tablet (81 mg total) by mouth daily.   atorvastatin 40 MG tablet Commonly known as:  LIPITOR Take 1 tablet (40 mg total) by mouth daily.   desloratadine 5 MG tablet Commonly known as:  CLARINEX Take 1 tablet (5 mg total) by mouth daily.   DULoxetine 60 MG capsule Commonly known as:  CYMBALTA Take 1 capsule (60 mg total) by mouth daily.   glucose blood test strip Use as instructed. The patient is insulin requiring, ICD 10 code E10.9. The patient tests 4 times per  day.   insulin aspart 100 UNIT/ML FlexPen Commonly known as:  NOVOLOG FLEXPEN Inject 20 Units into the skin 3 (three) times daily with meals. Up to 36 units each dose What changed:  how much to take  when to take this  additional instructions   insulin glargine 100 unit/mL Sopn Commonly known as:  LANTUS Inject 0.33 mLs (33 Units total) into the skin 2 (two) times daily. What changed:  how much to take  additional instructions   Insulin Pen Needle 32G X 4 MM Misc Use to inject insulin twice daily   loperamide 2 MG capsule Commonly known as:  IMODIUM Take 1 capsule (2 mg total) by mouth 3 (three) times daily before meals. Take 45 minutes before a meal   nitroGLYCERIN 0.4 MG SL tablet Commonly known as:  NITROSTAT Place 0.4 mg under the tongue every 5 (five) minutes as needed for chest pain.   Olmesartan-Amlodipine-HCTZ 40-10-25 MG Tabs Commonly known as:  TRIBENZOR Take 1 tablet by mouth daily.   oxymetazoline 0.05 % nasal spray Commonly known as:  AFRIN Place 1 spray into both nostrils 2 (two) times daily as needed for congestion.   spironolactone 25 MG tablet Commonly known as:  ALDACTONE Take 1 tablet (25 mg total) by mouth daily.   Vitamin D (Ergocalciferol) 50000 units Caps capsule Commonly known as:  DRISDOL Take 1 capsule (50,000 Units total) by mouth every 7 (seven) days.       Disposition and follow-up:  Ms.Lazette L Wenninger was discharged from [facility] in Good condition.  At the hospital follow up visit please address:  1.  DKA - has she been taking insulin? Has she been checking her blood sugar at home and if so does log show that she needs to go up on insulin doses?  Medication access difficulties - Was she able to pick up medications  The match letter? Is there any other assistance that she can be provided with?   2.  Labs / imaging needed at time of follow-up: BMET (crt 1.1 on the day of discharge, b/l 0.9)   3.  Pending labs/  test needing follow-up: none  Follow-up Appointments: Follow-up Information    [facility]. Schedule an appointment as soon as possible for a visit in 1 week(s).   Contact information: [address] [place] [address] [phone]          Hospital Course by problem list:    Diabetic ketoacidosis without coma associated with type 1 diabetes mellitus (HCC) 50 year old woman with PMH type 1 DM, HTN, HLD, depression, asthma who presented with nausea, vomiting, headache, dizziness, jaw pain, and chest pain. She reported that although her insulin was difficult to afford she had been taking her doses. She was found to have glucose 870, ketonuria, anion gap 26, and CO2 14 and was admitted for diabetic ketoacidosis. She was treated with insulin drip and her anion gap closed within 24 hours of hospitalization. After her gap closed her blood sugars were managed with moderate ISS and lantus 33 units qHs and sugars trended in the 200s. She was provided with samples of insulin and novolog pen from the internal medicine clinic and social work provided her with a match letter to cover a months worth of her prescriptions for $3 each.     Chest pain at rest   Elevated troponin   Unstable angina pectoris [facility]) On presentation she described central chest pain and jaw "touching the sheets makes it worse". Troponin peaked at 1.25 and EKG  Showed q waves but no acute ST changes. Cardiology was consulted, she had a low risk nuclear stress (cath was not performed in the setting of AKI), and her chest pain improved to what she experiences at baseline. The chest pain was attributed to demand ischemia in the setting of dehydration secondary to vomiting with DKA presentation.     AKI (acute kidney injury) (HCC) Crt 2.4 on admission up from baseline, improved to crt 1 on the day of discharge. Was thought to be pre renal secondary to nausea, vomiting, and poor PO intake.      Chest pain Systolic murmur was heard at the time of presentation. Echo showed EF 55%, regional wall motion abnormality could not be excluses, grade one diastolic dysfunction and trivial mitral regurg.   Discharge Vitals:   BP (!) 196/94 (BP Location: Right Arm)   Pulse 71   Temp 97.8 F (36.6 C) (Oral)   Resp 18   Ht 5\' 4"  (1.626 m)   Wt 169 lb 5 oz (76.8 kg)   SpO2 100%   BMI 29.06 kg/m   Pertinent Labs, Studies, and Procedures:  Procedures Performed:  Nm Myocar Multi W/spect W/wall Motion / Ef  Result Date: 02/25/2016  There was no ST segment deviation noted during stress.  No T wave inversion was noted during stress.  The study is normal.  This is a low risk study.  The  left ventricular ejection fraction is moderately decreased (30-44%).  Nuclear stress EF: 39%.  Low risk stress nuclear study with normal perfusion and moderate reduction in left ventricular global systolic function. Findings are most consistent with nonischemic cardiomyopathy. Recommend corroboration of EF assessment with echocardiography.   2D Echo: EF 55%, regional wall motion abnormality could not be excluses, grade one diastolic dysfunction and trivial mitral regurg.   Consultations: Treatment Team:  Rounding Lbcardiology, MD  Discharge Instructions: Discharge Instructions    Diet - low sodium heart healthy    Complete by:  As directed    Increase activity slowly    Complete by:  As directed       Signed: Eulah Pont, MD 02/26/2016, 2:27 PM   Pager: [phone]

## 2016-02-26 NOTE — Care Management Note (Signed)
Case Management Note  Patient Details  Name: Debra [name] MRN: [id number] Date of Birth: Jan 09, 1966  Subjective/Objective:                  slight chest pressure, nausea Action/Plan: Discharge planning Expected Discharge Date:  02/27/16               Expected Discharge Plan:  Home/Self Care  In-House Referral:  NA  Discharge planning Services  CM Consult, [facility], [facility] Program, Medication Assistance  Post Acute Care Choice:  NA Choice offered to:  NA  DME Arranged:    DME Agency:  NA  HH Arranged:  NA HH Agency:     Status of Service:  Completed, signed off  If discussed at [employer] of Stay Meetings, dates discussed:    Additional Comments: CM met with pt in room and gave pt [place] letter with list of participating pharmacies.  Pt verbalized understanding all parameters of the MATCH program.  Cm also gave pt [place] pamphlet and pt verbalized understanding she will go to the Clinic this Thursday morning at 08:30 and ask for APPOINTMENTS FOR: FOLLOW UP MEDICAL CARE; A PRIMARY CARE PHYSICIAN; and TO MEET WITH A Navigator to secure insurance.  No other CM needs were communicated. Dellie Catholic, RN 02/26/2016, 1:09 PM

## 2016-03-03 ENCOUNTER — Telehealth: Payer: Self-pay

## 2016-03-03 NOTE — Telephone Encounter (Signed)
Pt needs to speak with a nurse regarding meds. Please call back.  

## 2016-03-03 NOTE — Telephone Encounter (Signed)
Pt states she was disch from hosp by dr blum and given her scripts along with a "voucher" from the [facility] program through [place] to pay for her medications, 8 in all, she states that the doctor is not registered with the [facility] program. I called [facility] [address], spoke w/ pharmacist and was told that this was true, I ask if I could give her a different md and she was agreeable, gave dr butcher's info, she tried running it through the system and it was rejected saying dr Midwife is not registered, I then gave dr [employer] and he is not registered. I then ask for a contact# from the [facility] voucher and was given care mngment # [phone], I was told by care m. That I needed to call nina johnson at [phone], I called and she is in a meeting and was told she will rtc to me on [phone]

## 2016-03-28 ENCOUNTER — Telehealth: Payer: Self-pay | Admitting: Internal Medicine

## 2016-03-28 NOTE — Telephone Encounter (Signed)
APT. REMINDER CALL, LMTCB °

## 2016-03-29 ENCOUNTER — Ambulatory Visit: Payer: Self-pay

## 2016-03-29 ENCOUNTER — Telehealth: Payer: Self-pay | Admitting: Internal Medicine

## 2016-03-29 NOTE — Telephone Encounter (Signed)
CALLED PT,LMTCB, NEEDS TO RENEW GCCN

## 2016-04-28 ENCOUNTER — Telehealth: Payer: Self-pay | Admitting: Internal Medicine

## 2016-04-28 NOTE — Telephone Encounter (Signed)
CALLED PATIENT, LMTCB, TIME TO RENEW CAFA APPLICATION

## 2016-10-26 ENCOUNTER — Ambulatory Visit: Payer: Self-pay

## 2016-10-31 ENCOUNTER — Telehealth: Payer: Self-pay | Admitting: *Deleted

## 2016-10-31 ENCOUNTER — Ambulatory Visit (INDEPENDENT_AMBULATORY_CARE_PROVIDER_SITE_OTHER): Payer: Self-pay | Admitting: Internal Medicine

## 2016-10-31 ENCOUNTER — Ambulatory Visit (HOSPITAL_COMMUNITY)
Admission: RE | Admit: 2016-10-31 | Discharge: 2016-10-31 | Disposition: A | Discharge status: Home / Self Care | Payer: Self-pay | Source: Ambulatory Visit | Attending: Internal Medicine | Admitting: Internal Medicine

## 2016-10-31 ENCOUNTER — Other Ambulatory Visit: Payer: Self-pay | Admitting: Internal Medicine

## 2016-10-31 ENCOUNTER — Encounter: Payer: Self-pay | Admitting: Internal Medicine

## 2016-10-31 VITALS — BP 232/115 | HR 78 | Temp 97.9°F | Wt 153.3 lb

## 2016-10-31 DIAGNOSIS — E782 Mixed hyperlipidemia: Secondary | ICD-10-CM

## 2016-10-31 DIAGNOSIS — M549 Dorsalgia, unspecified: Secondary | ICD-10-CM | POA: Insufficient documentation

## 2016-10-31 DIAGNOSIS — M545 Low back pain, unspecified: Secondary | ICD-10-CM

## 2016-10-31 DIAGNOSIS — I10 Essential (primary) hypertension: Secondary | ICD-10-CM

## 2016-10-31 DIAGNOSIS — E1039 Type 1 diabetes mellitus with other diabetic ophthalmic complication: Secondary | ICD-10-CM

## 2016-10-31 DIAGNOSIS — R9431 Abnormal electrocardiogram [ECG] [EKG]: Secondary | ICD-10-CM | POA: Insufficient documentation

## 2016-10-31 DIAGNOSIS — R072 Precordial pain: Secondary | ICD-10-CM

## 2016-10-31 DIAGNOSIS — I208 Other forms of angina pectoris: Secondary | ICD-10-CM

## 2016-10-31 LAB — GLUCOSE, CAPILLARY: GLUCOSE-CAPILLARY: 216 mg/dL — AB (ref 65–99)

## 2016-10-31 LAB — POCT GLYCOSYLATED HEMOGLOBIN (HGB A1C): Hemoglobin A1C: >14

## 2016-10-31 MED ORDER — ASPIRIN EC 81 MG PO TBEC: 81.0000 mg | DELAYED_RELEASE_TABLET | Freq: Every day | ORAL | 0 refills | Status: DC

## 2016-10-31 MED ORDER — MELOXICAM 10 MG PO CAPS: 10.0000 mg | ORAL_CAPSULE | Freq: Every morning | ORAL | 0 refills | Status: DC

## 2016-10-31 MED ORDER — OLMESARTAN-AMLODIPINE-HCTZ 40-10-25 MG PO TABS: 1.0000 | ORAL_TABLET | Freq: Every day | ORAL | 0 refills | Status: DC

## 2016-10-31 MED ORDER — INSULIN PEN NEEDLE 32G X 4 MM MISC: 5 refills | Status: DC

## 2016-10-31 MED ORDER — ATORVASTATIN CALCIUM 40 MG PO TABS: 40.0000 mg | ORAL_TABLET | Freq: Every day | ORAL | 0 refills | Status: DC

## 2016-10-31 MED ORDER — MELOXICAM 10 MG PO CAPS: 15.0000 mg | ORAL_CAPSULE | Freq: Every morning | ORAL | 0 refills | Status: DC

## 2016-10-31 MED ORDER — GLUCOSE BLOOD VI STRP: ORAL_STRIP | 12 refills | Status: DC

## 2016-10-31 MED ORDER — AGAMATRIX PRESTO PRO METER DEVI: 0 refills | Status: DC

## 2016-10-31 NOTE — Patient Instructions (Addendum)
For your back pain, please go get xrays today so we can evaluate what is causing your pain. Please take Meloxicam once daily with breakfast for your pain.  For your diabetes, please continue taking your lantus at night. I have resent scripts for your testing supplies to [facility] so please pick them up and start testing 3-4 times a day. Please bring in your meter to all appointments.  For you blood pressure, I have sent in your omlesartan-amlodipine-HCTZ; take once a day.  Please make an appointment for one week to check on your back pain, your diabetes and your blood pressure.

## 2016-10-31 NOTE — Assessment & Plan Note (Signed)
Patient states that she fell off of her counter while cleaning about 1 month ago and hit her left and right hips; since then her back and bil hip pain has been getting progressively worse. She endorses associated bil LE weakness stemming from her hips, feeling of pins/needles and tingling in bil thighs, as well as fasciculations. Pain appears worse with extension and lying down. She denies saddle parasthesia; she endorses some urinary hesitancy at beginning of urinary stream but is otherwise able to urinate normally. She has chronic diarrhea with difficulty making it to the bathroom which has not changed. She has tried heat/ice, aleve, motrin without any relief.  Exam reveals lower lumbar spine tenderness, bilateral SI joint and surrounding tissue tenderness, pain with both internal and external rotation of hip, + straight leg raise bilaterally, 2/5 strength in bil LEs though hard to assess in setting of pain - she is able to ambulate by herself without assistance.  Differential diagnosis includes herniated disk, DJD, fracture. Will start workup with Xrays of hips, lumbar spine and pelvis and intensive NSAIDs.  Plan: --f/u DG lumbar spine, bil hips w/ pelvis --Meloxicam 15mg  daily #14 --f/u in 1 week

## 2016-10-31 NOTE — Assessment & Plan Note (Signed)
Refilled atorvastatin

## 2016-10-31 NOTE — Assessment & Plan Note (Signed)
She endorses exertional, central chest pain which is sometimes associated with shortness of breath and palpitations. ShOB and palpitations are sometimes isolated as well. Chest pain radiates out laterally across her chest. Pain is relieved with rest and with nitroglycerin. She denies dizziness, syncopal episodes. She has a history of NSTEMI in 01/2016 which was thought to be due to dehydration in setting of DKA; stress test at the time was low risk so CA was not pursued.   Exam reveals stable 2/6 systolic murmur. EKG performed in office today, personally reviewed - Sinus rhythm, with biphasic T waves in V5/6; no acute ST segment changes compared to previous; no q waves.   Patient with stable angina likely 2/2 uncontrolled HTN; EKG not concerning for ischemic process currently.   Plan: --restart olmesartan-amlodipine-HCTZ 40-10-25mg  daily for BP control and angina tx (amlodipine); patient has nitroglycerin at home to use PRN --f/u in 1 week; can add other anti-anginal medications if persists after BP control

## 2016-10-31 NOTE — Assessment & Plan Note (Addendum)
Patient states she only takes Lantus 33 units qhs (previous regimen per D/C summary is Lantus 33 units BID and novolog 20 units TID wc); patient states she adjusted due to feeling episodes of hypoglycemia; she does not check her CBGs at home due to lack of supplies [facility] coverage lapsed but now has again). Patient denies polyphagia, polydipsia or polyuria (however states she regularly drinks ~2.5 gallons of water a day and always has).   Plan: --A1c today >14 --now that patient has [employer] coverage again, resent supplies to health department --advised patient to check CBGs 3-4 times a day and if suspected hypoglycemia and bring in log or meter to all visits --will continue with just Lantus 33 units qhs for now until we have better data. --f/u in 1 wk

## 2016-10-31 NOTE — Telephone Encounter (Signed)
NEEDS REFILL LANTUS PEN, [facility].

## 2016-10-31 NOTE — Progress Notes (Signed)
CC: low back pain and bil hip pain  HPI:  [name] is a 51 y.o. with a PMH of HTN, HLD, T1DM, knee arthritis and chest pain presenting to clinic for low back pain and bil hip pain.  Patient states that she fell off of her counter while cleaning about 1 month ago and hit her left and right hips; since then her back and bil hip pain has been getting progressively worse. She endorses associated bil LE weakness stemming from her hips, feeling of pins/needles and tingling in bil thighs, as well as fasciculations. Pain appears worse with extension and lying down. She denies saddle parasthesia; she endorses some urinary hesitancy at beginning of urinary stream but is otherwise able to urinate normally. She has chronic diarrhea with difficulty making it to the bathroom which has not changed. She has tried heat/ice, aleve, motrin without any relief.   She states that she has not taken any of her other medicines for a few months due to running out. She does still continue to take Lantus 33 units qhs. She endorses feelings of hypoglycemia but does not check her CBGs due to lack of supplies.   She endorses exertional, central chest pain which is sometimes associated with shortness of breath and palpitations. ShOB and palpitations are sometimes isolated as well. Chest pain radiates out laterally across her chest. Pain is relieved with rest and with nitroglycerin. She denies dizziness, syncopal episodes. She has a history of NSTEMI in 01/2016 which was thought to be due to dehydration in setting of DKA; stress test at the time was low risk so CA was not pursued.   Please see problem based Assessment and Plan for status of patients chronic conditions.  Past Medical History:  Diagnosis Date  . AKI (acute kidney injury) (HCC)   . Allergy    seasonal  . Anal fissure   . Anemia    2005  . Anginal pain (HCC)   . Anxiety    1990  . Arthritis   . Asthma    2000  . Cataract   . Chest pain    a.  2007: cath showing normal cors.   . Depression 10/16/2012  . Diabetes mellitus    type i juvenille; 1988  . Difficult intubation    narrow airway  . Family history of malignant neoplasm of gastrointestinal tract   . GERD (gastroesophageal reflux disease)   . Heart murmur    Birth  . Hx of adenomatous colonic polyps   . Hyperlipidemia    2005  . Hypertension    1998  . Internal hemorrhoids 04/24/06   on colonoscopy  . Mental disorder    Depression 1990  . Migraine   . Neuromuscular disorder (HCC)    Neuropathy - hands/feet  . PONV (postoperative nausea and vomiting)   . RLS (restless legs syndrome)   . Sleep paralysis   . Staphylococcus aureus infection    toe  . Stroke [facility])    in 1990    Review of Systems:   Review of Systems  Constitutional: Negative for chills and fever.  HENT: Negative for hearing loss.   Eyes: Positive for blurred vision. Negative for double vision.  Respiratory: Negative for shortness of breath.   Cardiovascular: Positive for chest pain and palpitations. Negative for claudication and leg swelling.  Gastrointestinal: Positive for diarrhea (chronic) and nausea. Negative for vomiting.  Genitourinary: Negative for frequency.       Hesitancy   Musculoskeletal: Positive  for back pain and myalgias.  Neurological: Positive for focal weakness and headaches. Negative for dizziness.  Endo/Heme/Allergies: Negative for polydipsia (but drinks 2.5 gal per day).  Psychiatric/Behavioral: The patient has insomnia (2/2 back pain, takes [place]). Nervous/anxious: 2/2 back pain.     Physical Exam:  Vitals:   10/31/16 0952  BP: (!) 232/115  Pulse: 78  Temp: 97.9 F (36.6 C)  TempSrc: Oral  SpO2: 100%  Weight: 153 lb 4.8 oz (69.5 kg)   GENERAL- alert, co-operative, appears as stated age, not in any distress. HEENT- Atraumatic, normocephalic, EOMI, oral mucosa appears moist CARDIAC- RRR, 2/6 systolic murmur, no rubs or gallops. RESP- Moving equal volumes of  air, and clear to auscultation bilaterally, no wheezes or crackles. ABDOMEN- Soft, nontender, bowel sounds present. NEURO- No obvious Cr N abnormality. EXTREMITIES- pulse 2+, symmetric, no LE edema. Tender to palpation of lower lumbar spine, bil SI joints with surrounding musculature/soft tissue; + straight leg raise test bilaterally ~30 degrees, pain with internal and external hip rotation; pain with palpation of bil lateral trochanters; patellar reflexes diminished bilaterally; strength 2/5 in hips/knees however able to walk (w/ antalgeic gait). Sensation intact. SKIN- Warm, dry, no rash or lesion. PSYCH- Normal mood and affect, appropriate thought content and speech.  Assessment & Plan:   See Encounters Tab for problem based charting.   Patient discussed with Dr. Cecilie Kicks, MD Internal Medicine PGY2

## 2016-10-31 NOTE — Telephone Encounter (Signed)
Call from [facility] pharmacy, stated Meloxicam 10 mg not available, needs to be changed to 15 mg. Talked t Dr Samuella Cota, ok to change to 15 mg every morning. Called [facility] pharmacy back, left message to change to 15 mg.

## 2016-10-31 NOTE — Progress Notes (Signed)
Case discussed with Dr. Svalina at the time of the visit.  We reviewed the resident's history and exam and pertinent patient test results.  I agree with the assessment, diagnosis and plan of care documented in the resident's note. 

## 2016-10-31 NOTE — Assessment & Plan Note (Signed)
BP 232/115 today, patient not on any antihypertensive currently. She has no new alarm symptoms that would raise concern for hypertensive emergency (has chronic and stable headaches; chronic blurry vision that has been slightly worse in last 2 weeks consistently). Patient with intermittent chest pain with exertion; no chest pain currently.   Plan: --restart olmesartan-amlodipine-HCTZ 40-10-25mg  daily --f/u in 1 week, will get Bmet at that time as well

## 2016-11-01 ENCOUNTER — Telehealth: Payer: Self-pay

## 2016-11-01 DIAGNOSIS — I10 Essential (primary) hypertension: Secondary | ICD-10-CM

## 2016-11-01 DIAGNOSIS — E1039 Type 1 diabetes mellitus with other diabetic ophthalmic complication: Secondary | ICD-10-CM

## 2016-11-01 MED ORDER — GLUCOSE BLOOD VI STRP: ORAL_STRIP | 12 refills | Status: DC

## 2016-11-01 MED ORDER — LISINOPRIL-HYDROCHLOROTHIAZIDE 20-12.5 MG PO TABS: 2.0000 | ORAL_TABLET | Freq: Every day | ORAL | 5 refills | Status: DC

## 2016-11-01 MED ORDER — INSULIN GLARGINE 100 UNIT/ML ~~LOC~~ SOLN: 33.0000 [IU] | Freq: Every day | SUBCUTANEOUS | 3 refills | Status: DC

## 2016-11-01 MED ORDER — AMLODIPINE BESYLATE 10 MG PO TABS: 10.0000 mg | ORAL_TABLET | Freq: Every day | ORAL | 5 refills | Status: DC

## 2016-11-01 NOTE — Assessment & Plan Note (Signed)
Patient informed that health department needed medication changes due to their formulary; the following changes were made: --d/c Tribenzor (olmesartan-amlodipine-HCTZ 40-10-25) --ordered lisinopril-HCTZ 40-25mg  daily --ordered amlodipine 10mg  daily  She will f/u on 9/11

## 2016-11-01 NOTE — Telephone Encounter (Signed)
Requesting x-ray results. Please call pt back.  

## 2016-11-01 NOTE — Telephone Encounter (Signed)
Patient called about xray results and I informed her that they did not show a fracture or disc space narrowing that would be causing her pain.   Patient informed that health department needed medication changes due to their formulary; the following changes were made: --d/c Tribenzor (olmesartan-amlodipine-HCTZ 40-10-25) --ordered lisinopril-HCTZ 40-25mg  daily --ordered amlodipine 10mg  daily  Patient also asked for Lantus refill and new order for test strips which were provided. Will have these faxed over to health department tomorrow morning.   Nyra Market, MD IMTS - PGY2 Pager [phone]

## 2016-11-02 ENCOUNTER — Ambulatory Visit: Payer: Self-pay | Admitting: Pharmacist

## 2016-11-02 DIAGNOSIS — I10 Essential (primary) hypertension: Secondary | ICD-10-CM

## 2016-11-02 MED ORDER — AMLODIPINE BESYLATE 10 MG PO TABS: 10.0000 mg | ORAL_TABLET | Freq: Every day | ORAL | 0 refills | Status: DC

## 2016-11-02 MED ORDER — ATORVASTATIN CALCIUM 40 MG PO TABS
40.0000 mg | ORAL_TABLET | Freq: Every day | ORAL | 0 refills | Status: DC
Start: 2016-11-02 — End: 2016-11-02

## 2016-11-02 MED ORDER — LISINOPRIL-HYDROCHLOROTHIAZIDE 20-12.5 MG PO TABS: 1.0000 | ORAL_TABLET | Freq: Every day | ORAL | 0 refills | Status: DC

## 2016-11-02 MED ORDER — INSULIN ASPART 100 UNIT/ML ~~LOC~~ SOLN: 10.0000 [IU] | Freq: Three times a day (TID) | SUBCUTANEOUS | 5 refills | Status: DC

## 2016-11-02 MED FILL — LISINOPRIL-HCTZ 20-12.5 MG: 20-12.5 | 30 days supply | Qty: 30 | Fill #0

## 2016-11-02 MED FILL — ATORVASTATIN 40 MG TABLET: 40 | 30 days supply | Qty: 30 | Fill #0

## 2016-11-02 MED FILL — AMLODIPINE BESYLATE 10 MG T: 10 | 30 days supply | Qty: 30 | Fill #0

## 2016-11-02 NOTE — Telephone Encounter (Signed)
Called pt and [facility] - no answer; left message.

## 2016-11-02 NOTE — Assessment & Plan Note (Signed)
Patient called and stated her CBGs are undetectable on new meter. Will have her start taking Novolog 10 units TID wc (close to 50-50 split with her lantus). Script called into health department which patient is going to today, however I was unable to reach her to specify the dose over the phone this morning.

## 2016-11-02 NOTE — Telephone Encounter (Signed)
Thank you :)

## 2016-11-02 NOTE — Telephone Encounter (Signed)
Novolog rx called to [facility] pharmacy - stated it will have to be ordered for the pt.

## 2016-11-02 NOTE — Telephone Encounter (Signed)
Call from [facility] - pt is there; stating she does not have any money at all for any medications. Stated they will be able to give her a sample of Novolog. Suggested to talk to Amada Kingfisher to see if we could help. They did not receive any meds ordered yesterday (via Fax). Talked to [facility], stated she will call them back.

## 2016-11-02 NOTE — Progress Notes (Signed)
Medications were reviewed with the patient, including name, instructions, indication, goals of therapy, potential side effects, importance of adherence, and safe use. Provided a 1-time fill at [facility]. Patient verbalized understanding by repeating back information and was advised to contact me if further medication-related questions arise. Patient was also provided an information handout.

## 2016-11-02 NOTE — Telephone Encounter (Signed)
Since patient will have to wait for HD to order her Novolog, our office will provide sample to cover patient until order comes in. I attempted reaching patient to let her know of this (and health department pharmacy) but was unsuccessful.  Will have our nursing staff reach out to HD/patient again if able.   Patient to be given one Novolog vial; dose 10 units TID with meals - Dr. Selena Batten aware.  Nyra Market, MD IMTS - PGY2 Pager [phone]

## 2016-11-06 NOTE — Telephone Encounter (Signed)
Call made to patient advising her to keep appt scheduled for tomorrow with [facility]-no answer, message left on recorder.Kingsley Spittle Cassady9/10/20182:37 PM

## 2016-11-07 ENCOUNTER — Ambulatory Visit (HOSPITAL_COMMUNITY)
Admission: RE | Admit: 2016-11-07 | Discharge: 2016-11-07 | Disposition: A | Discharge status: Home / Self Care | Payer: Self-pay | Source: Ambulatory Visit | Attending: Internal Medicine | Admitting: Internal Medicine

## 2016-11-07 ENCOUNTER — Ambulatory Visit (INDEPENDENT_AMBULATORY_CARE_PROVIDER_SITE_OTHER): Payer: Self-pay | Admitting: Internal Medicine

## 2016-11-07 VITALS — BP 167/91 | HR 68 | Temp 97.8°F | Ht 64.0 in | Wt 150.8 lb

## 2016-11-07 DIAGNOSIS — M545 Low back pain: Secondary | ICD-10-CM

## 2016-11-07 DIAGNOSIS — R51 Headache: Secondary | ICD-10-CM

## 2016-11-07 DIAGNOSIS — M546 Pain in thoracic spine: Secondary | ICD-10-CM

## 2016-11-07 DIAGNOSIS — E1039 Type 1 diabetes mellitus with other diabetic ophthalmic complication: Secondary | ICD-10-CM

## 2016-11-07 DIAGNOSIS — M797 Fibromyalgia: Secondary | ICD-10-CM

## 2016-11-07 DIAGNOSIS — E1042 Type 1 diabetes mellitus with diabetic polyneuropathy: Secondary | ICD-10-CM

## 2016-11-07 DIAGNOSIS — I10 Essential (primary) hypertension: Secondary | ICD-10-CM | POA: Insufficient documentation

## 2016-11-07 DIAGNOSIS — G63 Polyneuropathy in diseases classified elsewhere: Secondary | ICD-10-CM

## 2016-11-07 DIAGNOSIS — I208 Other forms of angina pectoris: Secondary | ICD-10-CM

## 2016-11-07 DIAGNOSIS — Z9181 History of falling: Secondary | ICD-10-CM

## 2016-11-07 DIAGNOSIS — Z79899 Other long term (current) drug therapy: Secondary | ICD-10-CM

## 2016-11-07 HISTORY — DX: Fibromyalgia: M79.7

## 2016-11-07 LAB — POCT URINALYSIS DIPSTICK
Bilirubin, UA: NEGATIVE
Blood, UA: NEGATIVE
GLUCOSE UA: 500
Ketones, UA: NEGATIVE
Leukocytes, UA: NEGATIVE
NITRITE UA: NEGATIVE
PROTEIN UA: 100
Spec Grav, UA: 1.02 (ref 1.010–1.025)
UROBILINOGEN UA: 0.2 U/dL
pH, UA: 7 (ref 5.0–8.0)

## 2016-11-07 MED ORDER — GABAPENTIN 300 MG PO CAPS: 300.0000 mg | ORAL_CAPSULE | Freq: Every day | ORAL | 0 refills | Status: DC

## 2016-11-07 MED ORDER — METOPROLOL SUCCINATE ER 100 MG PO TB24: 100.0000 mg | ORAL_TABLET | Freq: Every day | ORAL | 5 refills | Status: DC

## 2016-11-07 MED ORDER — METOPROLOL SUCCINATE ER 100 MG PO TB24: 100.0000 mg | ORAL_TABLET | Freq: Every day | ORAL | 0 refills | Status: DC

## 2016-11-07 MED FILL — METOPROLOL SUCC ER 100 MG T: 100 | 30 days supply | Qty: 30 | Fill #0

## 2016-11-07 NOTE — Assessment & Plan Note (Addendum)
Patient with diffuse pain complaints with no encompassing underlying etiology that is most likely consistent with fibromyalgia.   Plan: --will start gabapentin 300mg  qhs for now which patient states she can afford at [facility] pharmacy this month; ideally if she get's approved for MAP program which would help ensure stable access to medicines and continues to follow up would have her on amitriptyline; other options are cymbalta and lyrica. --at f/u appt would like to get CBC, Cmet, mag, ferritin, vit D to r/o other contributing causes as she now has the orange card

## 2016-11-07 NOTE — Assessment & Plan Note (Addendum)
BP better though still not controlled with addition of amlodipine 10mg  daily, HCTZ 25 mg daily, and lisinopril 40mg  daily at 167/91 on repeat measurement today. No focal neurological deficits noted.  Plan: --add metoprolol succinate 100mg  daily for BP and anginal pain --f/u in 2 weeks

## 2016-11-07 NOTE — Assessment & Plan Note (Addendum)
Uncontrolled. Patient with A1c >14 at last visit. She was started on Novolog 10 units with meals TID in addition to her Lantus 33 units qhs. She bring in her meter which shows one reading of 73 in the AM; rest of readings (4 of them) are afternoon and range from 200-595. Patient continues to drink excessively. She continues to have blurry vision that is unchanged. She is willing to meet with the [name] for diabetes and nutritional counseling to help fine tune her regimen. Per patient she has always been very brittle diabetic; previous regimens range widely with drastic changes in amount of insulin but no change in her CBG control.   Plan: --will not make any changes to medication at this time --referral to DM/nutrional services placed - assistance is appreciated

## 2016-11-07 NOTE — Progress Notes (Signed)
CC: htn, diabetes  HPI:  [name] is a 51 y.o. with a PMH of HTN, uncontrolled T1DM, HLD, presenting to clinic for f/u on her back pain, HTN, and diabetes.  HTN: Patient started on amlodipine 10mg  daily, HCTZ 25 mg daily, and lisinopril 40mg  daily. She does not check her BP at home. She has continued to have 1-2 episodes of central chest pain with exertion that resolve with rest and associated with mild shortness of breath. She endorsed one episode with associated nausea and acid reflux. She denies radiation to back, jaw, arm, syncope. She endorses her chronic unchanged headaches with no acute changes in her vision or hearing.   T1DM: Patient with A1c >14 at last visit. She was started on Novolog 10 units with meals TID in addition to her Lantus 33 units qhs. She bring in her meter which shows one reading of 73 in the AM; rest of readings (4 of them) are afternoon and range from 200-595. Patient continues to drink excessively. She continues to have blurry vision that is unchanged. She is willing to meet with the [name] for diabetes and nutritional counseling to help fine tune her regimen. Per patient she has always been very brittle diabetic; previous regimens range widely with drastic changes in amount of insulin but no change in her CBG control.   Pain, neuropathy: Patient was seen last week for back pain after a fall which radiated to her legs; Xrays were unremarkable and she was treated with meloxicam 15mg  daily. Today patient states her pain is in her bilateral knees and is radiating up to her hips and back. She endorses her entire spine hurting, and continues to endorse pins/needles, numbness sensation in her legs. She now endorses that her back pain is worse with flexion compare to last week. She states that meloxicam has provided no relief. She denies bowel or bladder incontinence that is new to her.  Please see problem based Assessment and Plan for status of patients chronic  conditions.  Past Medical History:  Diagnosis Date  . AKI (acute kidney injury) (HCC)   . Allergy    seasonal  . Anal fissure   . Anemia    2005  . Anginal pain (HCC)   . Anxiety    1990  . Arthritis   . Asthma    2000  . Cataract   . Depression 10/16/2012  . Diabetes mellitus    type i juvenille; 1988  . Difficult intubation    narrow airway  . Family history of malignant neoplasm of gastrointestinal tract   . GERD (gastroesophageal reflux disease)   . Heart murmur    Birth  . Hx of adenomatous colonic polyps   . Hyperlipidemia    2005  . Hypertension    1998  . Internal hemorrhoids 04/24/06   on colonoscopy  . Mental disorder    Depression 1990  . Migraine   . Neuromuscular disorder (HCC)    Neuropathy - hands/feet  . RLS (restless legs syndrome)   . Sleep paralysis   . Stable angina pectoris [facility])    2007: cath showing normal cors.   . Stroke (HCC)    in 1990    Review of Systems:   ROS Per HPI  Physical Exam:  Vitals:   11/07/16 1002 11/07/16 1038  BP: (!) 211/99 (!) 167/91  Pulse: 73 68  Temp: 97.8 F (36.6 C)   SpO2: 100%   Weight: 150 lb 12.8 oz (68.4 kg)  Height: 5\' 4"  (1.626 m)    GENERAL- alert, co-operative, appears as stated age, not in any distress. HEENT- Atraumatic, normocephalic, PERRL, EOMI, oral mucosa appears moist CARDIAC- RRR, no murmurs, rubs or gallops. RESP- Moving equal volumes of air, and clear to auscultation bilaterally, no wheezes or crackles. ABDOMEN- Soft, nontender, bowel sounds present. NEURO- No Cr N abnormality. EXTREMITIES- strength intact, patient with 14/18 tender points; entire spine is TTP, thoracic and lumbar paraspinal musculature TTP throughout, pain elicited with any movement of her hips; pulse 2+, symmetric, no pedal edema. Decreased sensation to cold and sharp to bil LEs to knees SKIN- Warm, dry, no rash or lesion. PSYCH- Normal mood and affect, appropriate thought content and speech.  EKG personally  reviewed - SR, no q waves, no acute ST or T wave changes  Assessment & Plan:   See Encounters Tab for problem based charting.   Patient discussed with Dr. Cecilie Kicks, MD Internal Medicine PGY2

## 2016-11-07 NOTE — Assessment & Plan Note (Addendum)
Patient with bil U/LE neuropathy in setting of uncontrolled DM and fibromyalgia.   Plan: --will start gabapentin 300mg  qhs for now which patient states she can afford at [facility] pharmacy this month; ideally if she get's approved for MAP program which would help ensure stable access to medicines and continues to follow up would have her on amitriptyline; other options are cymbalta and lyrica. --at f/u appt would like to get CBC, Cmet, mag, ferritin, vit D to r/o other contributing causes

## 2016-11-07 NOTE — Assessment & Plan Note (Signed)
Continues to have episodes of stable angina and one episode that might have been precipitated by acid reflux.  Plan: --continue amlodipine 10mg  daily --add metoprolol 100mg  daily

## 2016-11-07 NOTE — Patient Instructions (Signed)
Please start taking metoprolol 100mg  daily. We will see you in 2 weeks to check on your blood pressure.  Keep checking your blood sugars first thing in the morning and 3-4 times throughout the day and bring in your meter to all appointments. We'll keep the insulin the same for right now.   I have placed a referral for diabetes and nutritional counseling with [name].

## 2016-11-08 ENCOUNTER — Other Ambulatory Visit: Payer: Self-pay | Admitting: Pharmacist

## 2016-11-08 DIAGNOSIS — M792 Neuralgia and neuritis, unspecified: Secondary | ICD-10-CM

## 2016-11-08 MED ORDER — GABAPENTIN 300 MG PO CAPS: 300.0000 mg | ORAL_CAPSULE | Freq: Every day | ORAL | 0 refills | Status: DC

## 2016-11-08 NOTE — Progress Notes (Signed)
Transferred prescription for gabapentin to [place] outpatient pharmacy for IM program. [facility] health department and patient were notified.

## 2016-11-08 NOTE — Progress Notes (Signed)
Case discussed with Dr. Svalina at the time of the visit.  We reviewed the resident's history and exam and pertinent patient test results.  I agree with the assessment, diagnosis and plan of care documented in the resident's note. 

## 2016-11-09 MED FILL — GABAPENTIN 300 MG CAPSULE: 300 | 30 days supply | Qty: 30 | Fill #0

## 2016-11-14 ENCOUNTER — Other Ambulatory Visit: Payer: Self-pay | Admitting: Pharmacist

## 2016-11-14 DIAGNOSIS — E1039 Type 1 diabetes mellitus with other diabetic ophthalmic complication: Secondary | ICD-10-CM

## 2016-11-14 DIAGNOSIS — I10 Essential (primary) hypertension: Secondary | ICD-10-CM

## 2016-11-14 DIAGNOSIS — J45909 Unspecified asthma, uncomplicated: Secondary | ICD-10-CM

## 2016-11-14 DIAGNOSIS — E782 Mixed hyperlipidemia: Secondary | ICD-10-CM

## 2016-11-14 MED ORDER — ASPIRIN EC 81 MG PO TBEC: 81.0000 mg | DELAYED_RELEASE_TABLET | Freq: Every day | ORAL | 3 refills | Status: DC

## 2016-11-14 MED ORDER — LISINOPRIL-HYDROCHLOROTHIAZIDE 20-12.5 MG PO TABS: 2.0000 | ORAL_TABLET | Freq: Every day | ORAL | 1 refills | Status: DC

## 2016-11-14 MED ORDER — INSULIN GLARGINE 100 UNIT/ML ~~LOC~~ SOLN: 33.0000 [IU] | Freq: Every day | SUBCUTANEOUS | 3 refills | Status: DC

## 2016-11-14 MED ORDER — METOPROLOL SUCCINATE ER 100 MG PO TB24: 100.0000 mg | ORAL_TABLET | Freq: Every day | ORAL | 0 refills | Status: DC

## 2016-11-14 MED ORDER — ALBUTEROL SULFATE HFA 108 (90 BASE) MCG/ACT IN AERS: 1.0000 | INHALATION_SPRAY | Freq: Four times a day (QID) | RESPIRATORY_TRACT | 0 refills | Status: DC | PRN

## 2016-11-14 MED ORDER — ATORVASTATIN CALCIUM 40 MG PO TABS: 40.0000 mg | ORAL_TABLET | Freq: Every day | ORAL | 3 refills | Status: DC

## 2016-11-14 MED ORDER — INSULIN ASPART 100 UNIT/ML ~~LOC~~ SOLN: 10.0000 [IU] | Freq: Three times a day (TID) | SUBCUTANEOUS | 5 refills | Status: DC

## 2016-11-14 MED ORDER — NITROGLYCERIN 0.4 MG SL SUBL: 0.4000 mg | SUBLINGUAL_TABLET | SUBLINGUAL | 0 refills | Status: DC | PRN

## 2016-11-14 MED ORDER — AMLODIPINE BESYLATE 10 MG PO TABS: 10.0000 mg | ORAL_TABLET | Freq: Every day | ORAL | 1 refills | Status: DC

## 2016-11-14 NOTE — Progress Notes (Signed)
Patient was approved for [facility] [employer]. Prescriptions sent.

## 2016-11-15 ENCOUNTER — Emergency Department (HOSPITAL_COMMUNITY): Payer: Self-pay

## 2016-11-15 ENCOUNTER — Inpatient Hospital Stay (HOSPITAL_COMMUNITY)
Admission: EM | Admit: 2016-11-15 | Discharge: 2016-11-22 | DRG: 058 | Disposition: A | Discharge status: Rehabilitation Facility | Payer: Self-pay | Attending: Internal Medicine | Admitting: Internal Medicine

## 2016-11-15 ENCOUNTER — Observation Stay (HOSPITAL_COMMUNITY): Payer: Self-pay

## 2016-11-15 ENCOUNTER — Encounter (HOSPITAL_COMMUNITY): Payer: Self-pay | Admitting: *Deleted

## 2016-11-15 DIAGNOSIS — N179 Acute kidney failure, unspecified: Secondary | ICD-10-CM | POA: Diagnosis present

## 2016-11-15 DIAGNOSIS — I10 Essential (primary) hypertension: Secondary | ICD-10-CM | POA: Diagnosis present

## 2016-11-15 DIAGNOSIS — M25562 Pain in left knee: Secondary | ICD-10-CM

## 2016-11-15 DIAGNOSIS — G8911 Acute pain due to trauma: Secondary | ICD-10-CM

## 2016-11-15 DIAGNOSIS — E1021 Type 1 diabetes mellitus with diabetic nephropathy: Secondary | ICD-10-CM | POA: Diagnosis present

## 2016-11-15 DIAGNOSIS — E1065 Type 1 diabetes mellitus with hyperglycemia: Secondary | ICD-10-CM

## 2016-11-15 DIAGNOSIS — R296 Repeated falls: Secondary | ICD-10-CM | POA: Diagnosis present

## 2016-11-15 DIAGNOSIS — E785 Hyperlipidemia, unspecified: Secondary | ICD-10-CM | POA: Diagnosis present

## 2016-11-15 DIAGNOSIS — G8929 Other chronic pain: Secondary | ICD-10-CM | POA: Diagnosis present

## 2016-11-15 DIAGNOSIS — Z91048 Other nonmedicinal substance allergy status: Secondary | ICD-10-CM

## 2016-11-15 DIAGNOSIS — M79606 Pain in leg, unspecified: Secondary | ICD-10-CM

## 2016-11-15 DIAGNOSIS — Z888 Allergy status to other drugs, medicaments and biological substances status: Secondary | ICD-10-CM

## 2016-11-15 DIAGNOSIS — R011 Cardiac murmur, unspecified: Secondary | ICD-10-CM

## 2016-11-15 DIAGNOSIS — E1036 Type 1 diabetes mellitus with diabetic cataract: Secondary | ICD-10-CM | POA: Diagnosis present

## 2016-11-15 DIAGNOSIS — G2581 Restless legs syndrome: Secondary | ICD-10-CM | POA: Diagnosis present

## 2016-11-15 DIAGNOSIS — R531 Weakness: Secondary | ICD-10-CM

## 2016-11-15 DIAGNOSIS — E1010 Type 1 diabetes mellitus with ketoacidosis without coma: Secondary | ICD-10-CM

## 2016-11-15 DIAGNOSIS — Z7982 Long term (current) use of aspirin: Secondary | ICD-10-CM

## 2016-11-15 DIAGNOSIS — Z803 Family history of malignant neoplasm of breast: Secondary | ICD-10-CM

## 2016-11-15 DIAGNOSIS — E860 Dehydration: Secondary | ICD-10-CM | POA: Diagnosis present

## 2016-11-15 DIAGNOSIS — Z8041 Family history of malignant neoplasm of ovary: Secondary | ICD-10-CM

## 2016-11-15 DIAGNOSIS — Z87892 Personal history of anaphylaxis: Secondary | ICD-10-CM

## 2016-11-15 DIAGNOSIS — G35D Multiple sclerosis, unspecified: Secondary | ICD-10-CM

## 2016-11-15 DIAGNOSIS — R29898 Other symptoms and signs involving the musculoskeletal system: Secondary | ICD-10-CM

## 2016-11-15 DIAGNOSIS — M25561 Pain in right knee: Secondary | ICD-10-CM

## 2016-11-15 DIAGNOSIS — Z800 Family history of malignant neoplasm of digestive organs: Secondary | ICD-10-CM

## 2016-11-15 DIAGNOSIS — Z8249 Family history of ischemic heart disease and other diseases of the circulatory system: Secondary | ICD-10-CM

## 2016-11-15 DIAGNOSIS — E1110 Type 2 diabetes mellitus with ketoacidosis without coma: Secondary | ICD-10-CM | POA: Diagnosis present

## 2016-11-15 DIAGNOSIS — I160 Hypertensive urgency: Secondary | ICD-10-CM | POA: Diagnosis present

## 2016-11-15 DIAGNOSIS — R10812 Left upper quadrant abdominal tenderness: Secondary | ICD-10-CM

## 2016-11-15 DIAGNOSIS — R197 Diarrhea, unspecified: Secondary | ICD-10-CM

## 2016-11-15 DIAGNOSIS — R10813 Right lower quadrant abdominal tenderness: Secondary | ICD-10-CM

## 2016-11-15 DIAGNOSIS — Z8673 Personal history of transient ischemic attack (TIA), and cerebral infarction without residual deficits: Secondary | ICD-10-CM

## 2016-11-15 DIAGNOSIS — Z880 Allergy status to penicillin: Secondary | ICD-10-CM

## 2016-11-15 DIAGNOSIS — Z794 Long term (current) use of insulin: Secondary | ICD-10-CM

## 2016-11-15 DIAGNOSIS — R10816 Epigastric abdominal tenderness: Secondary | ICD-10-CM

## 2016-11-15 DIAGNOSIS — K219 Gastro-esophageal reflux disease without esophagitis: Secondary | ICD-10-CM | POA: Diagnosis present

## 2016-11-15 DIAGNOSIS — E1042 Type 1 diabetes mellitus with diabetic polyneuropathy: Secondary | ICD-10-CM | POA: Diagnosis present

## 2016-11-15 DIAGNOSIS — Z9181 History of falling: Secondary | ICD-10-CM

## 2016-11-15 DIAGNOSIS — M797 Fibromyalgia: Secondary | ICD-10-CM | POA: Diagnosis present

## 2016-11-15 DIAGNOSIS — M545 Low back pain: Secondary | ICD-10-CM

## 2016-11-15 DIAGNOSIS — D72829 Elevated white blood cell count, unspecified: Secondary | ICD-10-CM

## 2016-11-15 DIAGNOSIS — IMO0002 Reserved for concepts with insufficient information to code with codable children: Secondary | ICD-10-CM

## 2016-11-15 DIAGNOSIS — Z841 Family history of disorders of kidney and ureter: Secondary | ICD-10-CM

## 2016-11-15 DIAGNOSIS — G35 Multiple sclerosis: Principal | ICD-10-CM | POA: Diagnosis present

## 2016-11-15 DIAGNOSIS — Z833 Family history of diabetes mellitus: Secondary | ICD-10-CM

## 2016-11-15 LAB — CBG MONITORING, ED
GLUCOSE-CAPILLARY: 561 mg/dL — AB (ref 65–99)
GLUCOSE-CAPILLARY: 541 mg/dL — AB (ref 65–99)
Glucose-Capillary: >600 mg/dL (ref 65–99)
Glucose-Capillary: 381 mg/dL — ABNORMAL HIGH (ref 65–99)

## 2016-11-15 LAB — BASIC METABOLIC PANEL
ANION GAP: 23 — AB (ref 5–15)
Anion gap: 13 (ref 5–15)
BUN: 48 mg/dL — ABNORMAL HIGH (ref 6–20)
BUN: 48 mg/dL — ABNORMAL HIGH (ref 6–20)
CALCIUM: 8.2 mg/dL — AB (ref 8.9–10.3)
CHLORIDE: 94 mmol/L — AB (ref 101–111)
CHLORIDE: 101 mmol/L (ref 101–111)
CO2: 12 mmol/L — AB (ref 22–32)
CO2: 18 mmol/L — AB (ref 22–32)
Calcium: 8.1 mg/dL — ABNORMAL LOW (ref 8.9–10.3)
Creatinine, Ser: 2.95 mg/dL — ABNORMAL HIGH (ref 0.44–1.00)
Creatinine, Ser: 3.03 mg/dL — ABNORMAL HIGH (ref 0.44–1.00)
GFR calc Af Amer: 20 mL/min — ABNORMAL LOW (ref >60)
GFR calc non Af Amer: 18 mL/min — ABNORMAL LOW (ref >60)
GFR calc non Af Amer: 17 mL/min — ABNORMAL LOW (ref >60)
GFR, EST AFRICAN AMERICAN: 20 mL/min — AB (ref >60)
Glucose, Bld: 888 mg/dL (ref 65–99)
Glucose, Bld: 544 mg/dL (ref 65–99)
POTASSIUM: 3.3 mmol/L — AB (ref 3.5–5.1)
Potassium: 3.5 mmol/L (ref 3.5–5.1)
Sodium: 129 mmol/L — ABNORMAL LOW (ref 135–145)
Sodium: 132 mmol/L — ABNORMAL LOW (ref 135–145)

## 2016-11-15 LAB — CBC
HCT: 42.9 % (ref 36.0–46.0)
Hemoglobin: 13.7 g/dL (ref 12.0–15.0)
MCH: 29 pg (ref 26.0–34.0)
MCHC: 31.9 g/dL (ref 30.0–36.0)
MCV: 90.7 fL (ref 78.0–100.0)
PLATELETS: 272 10*3/uL (ref 150–400)
RBC: 4.73 MIL/uL (ref 3.87–5.11)
RDW: 13.2 % (ref 11.5–15.5)
WBC: 9.4 10*3/uL (ref 4.0–10.5)

## 2016-11-15 LAB — COMPREHENSIVE METABOLIC PANEL
ALK PHOS: 82 U/L (ref 38–126)
ALT: 27 U/L (ref 14–54)
AST: 15 U/L (ref 15–41)
Albumin: 3.3 g/dL — ABNORMAL LOW (ref 3.5–5.0)
Anion gap: 33 — ABNORMAL HIGH (ref 5–15)
BILIRUBIN TOTAL: 1.7 mg/dL — AB (ref 0.3–1.2)
BUN: 39 mg/dL — AB (ref 6–20)
CALCIUM: 8.8 mg/dL — AB (ref 8.9–10.3)
CHLORIDE: 86 mmol/L — AB (ref 101–111)
CO2: 8 mmol/L — ABNORMAL LOW (ref 22–32)
CREATININE: 2.72 mg/dL — AB (ref 0.44–1.00)
GFR calc Af Amer: 22 mL/min — ABNORMAL LOW (ref >60)
GFR, EST NON AFRICAN AMERICAN: 19 mL/min — AB (ref >60)
Glucose, Bld: 998 mg/dL (ref 65–99)
Potassium: 4.8 mmol/L (ref 3.5–5.1)
Sodium: 127 mmol/L — ABNORMAL LOW (ref 135–145)
Total Protein: 6.4 g/dL — ABNORMAL LOW (ref 6.5–8.1)

## 2016-11-15 LAB — I-STAT VENOUS BLOOD GAS, ED
ACID-BASE DEFICIT: 16 mmol/L — AB (ref 0.0–2.0)
Bicarbonate: 11.2 mmol/L — ABNORMAL LOW (ref 20.0–28.0)
O2 SAT: 94 %
PO2 VEN: 88 mmHg — AB (ref 32.0–45.0)
TCO2: 12 mmol/L — ABNORMAL LOW (ref 22–32)
pCO2, Ven: 30.3 mmHg — ABNORMAL LOW (ref 44.0–60.0)
pH, Ven: 7.178 — CL (ref 7.250–7.430)

## 2016-11-15 LAB — URINALYSIS, ROUTINE W REFLEX MICROSCOPIC
BACTERIA UA: NONE SEEN
Bilirubin Urine: NEGATIVE
Glucose, UA: >=500 mg/dL — AB
Hgb urine dipstick: NEGATIVE
Ketones, ur: 20 mg/dL — AB
Leukocytes, UA: NEGATIVE
Nitrite: NEGATIVE
Protein, ur: 30 mg/dL — AB
SPECIFIC GRAVITY, URINE: 1.021 (ref 1.005–1.030)
Squamous Epithelial / LPF: NONE SEEN
pH: 5 (ref 5.0–8.0)

## 2016-11-15 LAB — GLUCOSE, CAPILLARY: Glucose-Capillary: 333 mg/dL — ABNORMAL HIGH (ref 65–99)

## 2016-11-15 MED ORDER — SODIUM CHLORIDE 0.9 % IV BOLUS (SEPSIS)
1000.0000 mL | Freq: Once | INTRAVENOUS | Status: AC
Start: 2016-11-15 — End: 2016-11-16
  Administered 2016-11-15: 1000 mL via INTRAVENOUS

## 2016-11-15 MED ORDER — SODIUM CHLORIDE 0.9 % IV BOLUS (SEPSIS)
1000.0000 mL | Freq: Once | INTRAVENOUS | Status: AC
  Administered 2016-11-15: 1000 mL via INTRAVENOUS

## 2016-11-15 MED ORDER — ACETAMINOPHEN 325 MG PO TABS
650.0000 mg | ORAL_TABLET | Freq: Four times a day (QID) | ORAL | Status: DC | PRN
  Administered 2016-11-16 – 2016-11-20 (×5): 650 mg via ORAL
  Filled 2016-11-15 (×5): qty 2

## 2016-11-15 MED ORDER — ACETAMINOPHEN 650 MG RE SUPP: 650.0000 mg | Freq: Four times a day (QID) | RECTAL | Status: DC | PRN

## 2016-11-15 MED ORDER — HEPARIN SODIUM (PORCINE) 5000 UNIT/ML IJ SOLN: 5000.0000 [IU] | Freq: Three times a day (TID) | INTRAMUSCULAR | Status: DC

## 2016-11-15 MED ORDER — OXYCODONE-ACETAMINOPHEN 5-325 MG PO TABS
ORAL_TABLET | ORAL | Status: AC
  Filled 2016-11-15: qty 1

## 2016-11-15 MED ORDER — SODIUM CHLORIDE 0.9 % IV SOLN
INTRAVENOUS | Status: DC
  Administered 2016-11-15: 5.4 [IU]/h via INTRAVENOUS
  Filled 2016-11-15 (×4): qty 1

## 2016-11-15 MED ORDER — SODIUM CHLORIDE 0.9 % IV SOLN: INTRAVENOUS | Status: AC

## 2016-11-15 MED ORDER — SODIUM CHLORIDE 0.9 % IV SOLN: INTRAVENOUS | Status: DC

## 2016-11-15 MED ORDER — DEXTROSE-NACL 5-0.45 % IV SOLN: INTRAVENOUS | Status: DC

## 2016-11-15 MED ORDER — DEXTROSE-NACL 5-0.45 % IV SOLN
INTRAVENOUS | Status: DC
  Administered 2016-11-16: 01:00:00 via INTRAVENOUS

## 2016-11-15 MED ORDER — POTASSIUM CHLORIDE IN NACL 40-0.9 MEQ/L-% IV SOLN
INTRAVENOUS | Status: DC
  Administered 2016-11-15: 125 mL/h via INTRAVENOUS
  Filled 2016-11-15: qty 1000

## 2016-11-15 MED ORDER — SODIUM CHLORIDE 0.9 % IV SOLN
INTRAVENOUS | Status: DC
  Administered 2016-11-15: 18:00:00 via INTRAVENOUS

## 2016-11-15 MED ORDER — POTASSIUM CHLORIDE 10 MEQ/100ML IV SOLN
10.0000 meq | INTRAVENOUS | Status: AC
  Administered 2016-11-15: 10 meq via INTRAVENOUS
  Filled 2016-11-15 (×2): qty 100

## 2016-11-15 MED ORDER — HEPARIN SODIUM (PORCINE) 5000 UNIT/ML IJ SOLN
5000.0000 [IU] | Freq: Three times a day (TID) | INTRAMUSCULAR | Status: DC
  Administered 2016-11-15: 5000 [IU] via SUBCUTANEOUS
  Filled 2016-11-15: qty 1

## 2016-11-15 MED ORDER — OXYCODONE-ACETAMINOPHEN 5-325 MG PO TABS
1.0000 | ORAL_TABLET | ORAL | Status: AC | PRN
  Administered 2016-11-15 (×2): 1 via ORAL
  Filled 2016-11-15: qty 1

## 2016-11-15 MED ORDER — PROMETHAZINE HCL 25 MG PO TABS: 12.5000 mg | ORAL_TABLET | Freq: Four times a day (QID) | ORAL | Status: DC | PRN

## 2016-11-15 MED ORDER — LACTATED RINGERS IV BOLUS (SEPSIS): 1000.0000 mL | Freq: Once | INTRAVENOUS | Status: DC

## 2016-11-15 MED ORDER — POTASSIUM CHLORIDE 10 MEQ/100ML IV SOLN
10.0000 meq | INTRAVENOUS | Status: AC
  Administered 2016-11-15 (×2): 10 meq via INTRAVENOUS
  Filled 2016-11-15: qty 100

## 2016-11-15 NOTE — H&P (Signed)
Date: 11/15/2016               Patient Name:  Debra [name] MRN: [id number]  DOB: 1965-10-06 Age / Sex: 51 y.o., female   PCP: Nyra Market, MD         Medical Service: Internal Medicine Teaching Service         Attending Physician: Dr. Burns Spain, MD    First Contact: Dr. Caron Presume Pager: [phone]  Second Contact: Dr. Johnny Bridge Pager: [phone]       After Hours (After 5p/  First Contact Pager: [phone]  weekends / holidays): Second Contact Pager: [phone]   Chief Complaint: Weakness  History of Present Illness: Ms. Perdomo is a 51 y.o. Female with a PMHx significant for type 1 diabetes mellitus, neuropathy, and fibromyalgia who presented to the ED with a 2 week history of progressive weakness of her lower extremities. She reports that over the past 2 months she has felt weaker and has experienced trouble walking. This is a new problem for her and she has never had anything like this before. Associated with the weakness is diarrhea that is primarily brown but does have some mucus in it. She admits to having up to 4 bowel movements per day, is occasionally woke up in the middle of the night to defecate, and has some abdominal pain. She denies recent fevers, chills, bloody stools, changes in dietary habits, sick contact, new skin rash, or recent travel. She does report decreased urinary output but seems to be eating and drinking appropriately. Endorses myalgias and arthralgias.   She reports using her insulin as prescribed with no missed dosages; however, is unable to tell us how much she takes. She does not store her extra insulin pens in the fridge and reports always injecting in the same site. She has been admitted for DKA in the past.   ED Course: Labs remarkable for a venous blood gas pH of 7.17, sodium of 127, glucose of 998, bicarb of 8, creatinine of 2.72, and an AG of 33. Her potassium was 4.8 and she was started on an insulin drip. CT abdomen and CXR were unremarkable.    Meds:  Current Meds  Medication Sig  . albuterol (PROVENTIL HFA;VENTOLIN HFA) 108 (90 Base) MCG/ACT inhaler Inhale 1-2 puffs into the lungs every 6 (six) hours as needed for wheezing or shortness of breath.  Marland Kitchen amLODipine (NORVASC) 10 MG tablet Take 1 tablet (10 mg total) by mouth daily.  Marland Kitchen aspirin EC 81 MG tablet Take 1 tablet (81 mg total) by mouth daily.  Marland Kitchen atorvastatin (LIPITOR) 40 MG tablet Take 1 tablet (40 mg total) by mouth daily.  . 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 The patient tests 4 times per day.  . gabapentin (NEURONTIN) 300 MG capsule Take 1 capsule (300 mg total) by mouth at bedtime. IM program 1-time fill  . glucose blood (AGAMATRIX AMP TEST) test strip Use as instructed  . insulin aspart (NOVOLOG) 100 UNIT/ML injection Inject 10 Units into the skin 3 (three) times daily before meals.  . insulin glargine (LANTUS) 100 UNIT/ML injection Inject 0.33 mLs (33 Units total) into the skin at bedtime.  Marland Kitchen lisinopril-hydrochlorothiazide (PRINZIDE,ZESTORETIC) 20-12.5 MG tablet Take 2 tablets by mouth daily.  . Meloxicam 10 MG CAPS Take 15 mg by mouth every morning.  . metoprolol succinate (TOPROL XL) 100 MG 24 hr tablet Take 1 tablet (100 mg total)  by mouth daily. Take with or immediately following a meal.  . nitroGLYCERIN (NITROSTAT) 0.4 MG SL tablet Place 1 tablet (0.4 mg total) under the tongue every 5 (five) minutes as needed for chest pain.   Allergies: Allergies as of 11/15/2016 - Review Complete 11/15/2016  Allergen Reaction Noted  . Penicillins Anaphylaxis, Nausea And Vomiting, and Rash 07/06/2010  . Tape Rash 02/24/2016   Past Medical History:  Diagnosis Date  . Allergy    seasonal  . Anemia    2005  . Anxiety    1990  . Arthritis   . Asthma    2000  . Cataract   . Depression 10/16/2012  . Diabetes mellitus    type i juvenille; 1988  . Difficult intubation    narrow airway  .  Family history of malignant neoplasm of gastrointestinal tract   . GERD (gastroesophageal reflux disease)   . Heart murmur    Birth  . Hx of adenomatous colonic polyps   . Hyperlipidemia    2005  . Hypertension    1998  . Internal hemorrhoids 04/24/06   on colonoscopy  . Mental disorder    Depression 1990  . Migraine   . Neuromuscular disorder (HCC)    Neuropathy - hands/feet  . RLS (restless legs syndrome)   . Sleep paralysis   . Stable angina pectoris [facility])    2007: cath showing normal cors.   . Stroke (HCC)    in 16   Family History:  Family history of DM  No family history of IBD  Social History:  Lives with a friend  Denies the use of tobacco, EtOH, or illicit drugs   Review of Systems: A complete ROS was negative except as per HPI.   Physical Exam: Blood pressure 138/64, pulse 88, temperature 98 F (36.7 C), temperature source Oral, resp. rate 17, SpO2 99 %.  General: Thin female, lying in bed with her eyes closed groaning  HENT: Dry mucous membranes  Pulm: Good air movement with no crackles or wheezing heard  CV: RRR, murmur appreciated (systolic) GI: Active bowel sounds, soft, non-distended, diffuse tenderness to palpation with the worse being in the RLQ Extremities: No edema, moving all extremities spontaneously, pulses palpable  Skin: Turgor appears normal, dry legs  Psych: Appears confused and unable to track questions  CXR: personally reviewed: my interpretation is no acute intrapulmonary changes   Assessment & Plan by Problem: Active Problems:   DKA (diabetic ketoacidoses) (HCC)  1. Diabetic Ketoacidosis  - Weakness, diffuse abdominal pain, N/V - A1c >14 on 10/31/2016  - Glucose 998, with associated HAGMA, Ketone in her urine, sodium corrects to 142, K 4.8 - EKG pending  - Admit to step down with telemetry  - Started on insulin drip and IVF + potassium  - NPO until AG closes x 2  - Continue insulin drip and ensure overlap of >2 hours with SubQ  insulin   2. AKI  - Baseline creatinine ~2.0 - Creatinine elevated to 2.72  - Pre-renal etiology due to dehydration  - Treat with IVF   3. Uncontrolled Type 1 Diabete Mellitus  - States that she is taking her insulin appropriately but unable to tell us the dosages she takes  - A1c >14 on 9/4  - Will need diabetes education and evaluation of home insulin regimen   Diet: NPO VTE ppx: SubQ Heparin  Code Status: Full   Dispo: Admit patient to Inpatient with expected length of stay greater than 2 midnights.  Signed: Levora Dredge, MD 11/15/2016, 5:31 PM  My Pager: [phone]

## 2016-11-15 NOTE — ED Triage Notes (Signed)
Pt reports increasing pain to bilateral hips and legs for over the past month. Now unable to ambulate and having falls. Reports n/v/d with bowel incontinence.

## 2016-11-15 NOTE — ED Notes (Signed)
This RN ordered a new bag of insulin from pharmacy.  Pharmacy will tube to Pod B.

## 2016-11-15 NOTE — ED Provider Notes (Signed)
Angiocath insertion Performed by: Tilden Fossa  Consent: Verbal consent obtained. Risks and benefits: risks, benefits and alternatives were discussed Time out: Immediately prior to procedure a "time out" was called to verify the correct patient, procedure, equipment, support staff and site/side marked as required.  Preparation: Patient was prepped and draped in the usual sterile fashion.  Vein Location: right AC  Ultrasound Guided  Gauge: 20  Normal blood return and flush without difficulty Patient tolerance: Patient tolerated the procedure well with no immediate complications.      Tilden Fossa, MD 11/16/16 [phone]

## 2016-11-15 NOTE — ED Provider Notes (Signed)
[facility]-EMERGENCY DEPT Provider Note   CSN: [id number] Arrival date & time: 11/15/16  0931     History   Chief Complaint Chief Complaint  Patient presents with  . Back Pain  . Emesis    HPI Debra [name] is a 51 y.o. female.  Patient with history of insulin-dependent diabetes, neuropathy, fibromyalgia -- presents with feeling poorly for the past one month. Patient reports nausea, vomiting. No fevers or cough. She reports pain all over her abdomen and her lower back is making it difficult for her to ambulate. She has associated nonbloody diarrhea ongoing during this time. She reports increased thirst and urination. Admits to missing insulin dose yesterday. The onset of this condition was acute. The course is constant. Aggravating factors: none. Alleviating factors: none.         Past Medical History:  Diagnosis Date  . Allergy    seasonal  . Anemia    2005  . Anxiety    1990  . Arthritis   . Asthma    2000  . Cataract   . Depression 10/16/2012  . Diabetes mellitus    type i juvenille; 1988  . Difficult intubation    narrow airway  . Family history of malignant neoplasm of gastrointestinal tract   . GERD (gastroesophageal reflux disease)   . Heart murmur    Birth  . Hx of adenomatous colonic polyps   . Hyperlipidemia    2005  . Hypertension    1998  . Internal hemorrhoids 04/24/06   on colonoscopy  . Mental disorder    Depression 1990  . Migraine   . Neuromuscular disorder (HCC)    Neuropathy - hands/feet  . RLS (restless legs syndrome)   . Sleep paralysis   . Stable angina pectoris [facility])    2007: cath showing normal cors.   . Stroke [facility])    in 1990    Patient Active Problem List   Diagnosis Date Noted  . Fibromyalgia 11/07/2016  . Back pain 10/31/2016  . Stable angina pectoris (HCC)   . Vitamin D deficiency 08/31/2014  . Macular edema due to secondary diabetes (HCC) 07/24/2014  . History of fracture of right ankle 10/06/2013  . Hyperlipidemia  10/06/2013  . Healthcare maintenance 11/08/2012  . Depression 10/16/2012  . Bilateral knee pain 02/16/2012  . Chronic diarrhea 10/19/2010  . Peripheral neuropathy 08/19/2010  . 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 Hypertension 07/06/2010  . Asthma 07/06/2010  . Migraine 07/06/2010    Past Surgical History:  Procedure Laterality Date  . bilateral foot surgery    . BREAST SURGERY     biopsy left breast  . CARDIAC CATHETERIZATION     around 2007 or 2008  . CESAREAN SECTION    . CHOLECYSTECTOMY    . COLONOSCOPY W/ BIOPSIES AND POLYPECTOMY    . DILATION AND CURETTAGE OF UTERUS  12/29/2010   Procedure: DILATATION AND CURETTAGE (D&C);  Surgeon: Tereso Newcomer, MD;  Location: [facility] ORS;  Service: Gynecology;;  . ENDOMETRIAL ABLATION W/ NOVASURE     Nov 2011  . EYE SURGERY    . HYSTEROSCOPY  12/29/2010   Procedure: HYSTEROSCOPY WITH HYDROTHERMAL ABLATION;  Surgeon: Tereso Newcomer, MD;  Location: [facility] OR[facility];  Service: Gynecology;;  . IUD REMOVAL  12/29/2010   Procedure: INTRAUTERINE DEVICE (IUD) REMOVAL;  Surgeon: Tereso Newcomer, MD;  Location: [facility];  Service: Gynecology;  Laterality: N/A;  . LAPAROSCOPIC TUBAL LIGATION  12/29/2010  Procedure: LAPAROSCOPIC TUBAL LIGATION;  Surgeon: Tereso Newcomer, MD;  Location: [facility] ORS;  Service: Gynecology;  Laterality: Bilateral;  . ORIF ANKLE FRACTURE Right 10/09/2013   Procedure: Open reduction internal fixation right ankle;  Surgeon: Mable Paris, MD;  Location[facility];  Service: Orthopedics;  Laterality: Right;  Open reduction internal fixation right ankle  . TRIGGER FINGER RELEASE     x 3    OB History    Gravida Para Term Preterm AB Living   0 2   SAB TAB Ectopic Multiple Live Births   0 0 0 0 2       Home Medications    Prior to Admission medications   Medication Sig Start Date End Date Taking? Authorizing Provider  albuterol (PROVENTIL HFA;VENTOLIN HFA) 108 (90 Base) MCG/ACT  inhaler Inhale 1-2 puffs into the lungs every 6 (six) hours as needed for wheezing or shortness of breath. 11/14/16  Yes Nyra Market, MD  amLODipine (NORVASC) 10 MG tablet Take 1 tablet (10 mg total) by mouth daily. 11/14/16 11/14/17 Yes Nyra Market, MD  aspirin EC 81 MG tablet Take 1 tablet (81 mg total) by mouth daily. 11/14/16  Yes Nyra Market, MD  atorvastatin (LIPITOR) 40 MG tablet Take 1 tablet (40 mg total) by mouth daily. 11/14/16  Yes Nyra Market, MD  Blood Glucose Monitoring Suppl (AGAMATRIX PRESTO PRO METER) DEVI The patient is insulin requiring, ICD 10 code E10.9. The patient tests 4 times per day. 10/31/16  Yes Nyra Market, MD  gabapentin (NEURONTIN) 300 MG capsule Take 1 capsule (300 mg total) by mouth at bedtime. IM program 1-time fill 11/08/16 11/08/17 Yes Nyra Market, MD  glucose blood (AGAMATRIX AMP TEST) test strip Use as instructed 11/01/16  Yes Nyra Market, MD  insulin aspart (NOVOLOG) 100 UNIT/ML injection Inject 10 Units into the skin 3 (three) times daily before meals. 11/14/16  Yes Nyra Market, MD  insulin glargine (LANTUS) 100 UNIT/ML injection Inject 0.33 mLs (33 Units total) into the skin at bedtime. 11/14/16 11/14/17 Yes Nyra Market, MD  lisinopril-hydrochlorothiazide (PRINZIDE,ZESTORETIC) 20-12.5 MG tablet Take 2 tablets by mouth daily. 11/14/16  Yes Nyra Market, MD  Meloxicam 10 MG CAPS Take 15 mg by mouth every morning. 10/31/16  Yes Nyra Market, MD  metoprolol succinate (TOPROL XL) 100 MG 24 hr tablet Take 1 tablet (100 mg total) by mouth daily. Take with or immediately following a meal. 11/14/16 11/14/17 Yes Nyra Market, MD  nitroGLYCERIN (NITROSTAT) 0.4 MG SL tablet Place 1 tablet (0.4 mg total) under the tongue every 5 (five) minutes as needed for chest pain. 11/14/16  Yes Nyra Market, MD  Insulin Pen Needle 32G X 4 MM MISC Use to inject insulin twice daily 10/31/16   Nyra Market, MD    Family History Family History  Problem  Relation Age of Onset  . Hypertension Mother   . Kidney disease Mother   . Hypertension Father   . Breast cancer Maternal Grandmother   . Prostate cancer Maternal Grandfather   . Ovarian cancer Paternal Grandmother   . Prostate cancer Paternal Grandfather   . Colon cancer Maternal Uncle     Social History Social History  Substance Use Topics  . Smoking status: Never Smoker  . Smokeless tobacco: Never Used  . Alcohol use No     Allergies   Penicillins and Tape   Review of Systems Review of Systems  Constitutional: Negative for fever.  HENT: Negative for rhinorrhea and sore throat.  Eyes: Negative for redness.  Respiratory: Negative for cough.   Cardiovascular: Negative for chest pain.  Gastrointestinal: Positive for abdominal pain, diarrhea, nausea and vomiting.  Endocrine: Positive for polydipsia and polyuria.  Genitourinary: Negative for dysuria.  Musculoskeletal: Positive for back pain. Negative for myalgias.  Skin: Negative for rash.  Neurological: Negative for headaches.     Physical Exam Updated Vital Signs BP (!) 118/50 (BP Location: Left Arm)   Pulse 77   Temp 98 F (36.7 C) (Oral)   Resp 14   SpO2 100%   Physical Exam  Constitutional: She appears well-developed and well-nourished.  HENT:  Head: Normocephalic and atraumatic.  Eyes: Conjunctivae are normal. Right eye exhibits no discharge. Left eye exhibits no discharge.  Neck: Normal range of motion. Neck supple.  Cardiovascular: Normal rate, regular rhythm and normal heart sounds.   Pulmonary/Chest: Effort normal and breath sounds normal. No respiratory distress. She has no wheezes. She has no rales.  Abdominal: Soft. There is tenderness. There is guarding (voluntary). There is no rebound.  Musculoskeletal:       Cervical back: She exhibits normal range of motion, no tenderness and no bony tenderness.       Thoracic back: She exhibits normal range of motion, no tenderness and no bony tenderness.        Lumbar back: She exhibits tenderness and bony tenderness. She exhibits normal range of motion.       Back:  Neurological: She is alert.  Skin: Skin is warm and dry.  Psychiatric: She has a normal mood and affect.  Nursing note and vitals reviewed.    ED Treatments / Results  Labs (all labs ordered are listed, but only abnormal results are displayed) Labs Reviewed  COMPREHENSIVE METABOLIC PANEL - Abnormal; Notable for the following:       Result Value   Sodium 127 (*)    Chloride 86 (*)    CO2 8 (*)    Glucose, Bld 998 (*)    BUN 39 (*)    Creatinine, Ser 2.72 (*)    Calcium 8.8 (*)    Total Protein 6.4 (*)    Albumin 3.3 (*)    Total Bilirubin 1.7 (*)    GFR calc non Af Amer 19 (*)    GFR calc Af Amer 22 (*)    Anion gap 33 (*)    All other components within normal limits  URINALYSIS, ROUTINE W REFLEX MICROSCOPIC - Abnormal; Notable for the following:    Color, Urine STRAW (*)    Glucose, UA >=500 (*)    Ketones, ur 20 (*)    Protein, ur 30 (*)    All other components within normal limits  CBG MONITORING, ED - Abnormal; Notable for the following:    Glucose-Capillary >600 (*)    All other components within normal limits  I-STAT VENOUS BLOOD GAS, ED - Abnormal; Notable for the following:    pH, Ven 7.178 (*)    pCO2, Ven 30.3 (*)    pO2, Ven 88.0 (*)    Bicarbonate 11.2 (*)    TCO2 12 (*)    Acid-base deficit 16.0 (*)    All other components within normal limits  CBG MONITORING, ED - Abnormal; Notable for the following:    Glucose-Capillary >600 (*)    All other components within normal limits  CBC    EKG  EKG Interpretation None       Radiology Ct Abdomen Pelvis Wo Contrast  Result Date: 11/15/2016 CLINICAL  DATA:  Abdominal pain, nausea, vomiting EXAM: CT ABDOMEN AND PELVIS WITHOUT CONTRAST TECHNIQUE: Multidetector CT imaging of the abdomen and pelvis was performed following the standard protocol without IV contrast. COMPARISON:  None. FINDINGS:  Lower chest: Lung bases are clear. Hepatobiliary: Unenhanced liver is unremarkable. Status post cholecystectomy. No intrahepatic extrahepatic ductal dilatation. Pancreas: Unremarkable. Spleen: Within normal limits. Adrenals/Urinary Tract: Adrenal glands within normal limits. Kidneys are within normal limits. No renal calculi or hydronephrosis. Distended bladder. Stomach/Bowel: Stomach is within normal limits. No evidence of bowel obstruction. Normal appendix (series 3/image 54). Vascular/Lymphatic: No evidence of abdominal aortic aneurysm. Atherosclerotic calcifications of the abdominal aorta and branch vessels. No suspicious abdominopelvic lymphadenopathy. Reproductive: Uterus notable for fluid with in the endometrial cavity (series 3/ image 21). Bilateral tubal ligation. Dominant 3.0 cm left ovarian cyst/follicle, physiologic. Right ovary is unremarkable. Other: No abdominopelvic ascites. Musculoskeletal: Visualized osseous structures are within normal limits. IMPRESSION: No evidence of bowel obstruction.  Normal appendix. No renal, ureteral, or bladder calculi.  No hydronephrosis. No CT findings to account for the patient's abdominal pain. Electronically Signed   By: Charline Bills M.D.   On: 11/15/2016 14:45   Dg Chest Portable 1 View  Result Date: 11/15/2016 CLINICAL DATA:  Fall with back pain and weakness EXAM: PORTABLE CHEST 1 VIEW COMPARISON:  10/09/2013 FINDINGS: Cardiomegaly that was also seen previously. Negative aortic and hilar contours. There is no edema, consolidation, effusion, or pneumothorax. No acute osseous finding. IMPRESSION: 1. No acute finding. 2. Cardiomegaly. Electronically Signed   By: Marnee Spring M.D.   On: 11/15/2016 12:40    Procedures Procedures (including critical care time)  Medications Ordered in ED Medications  oxyCODONE-acetaminophen (PERCOCET/ROXICET) 5-325 MG per tablet 1 tablet (1 tablet Oral Given 11/15/16 1025)  oxyCODONE-acetaminophen (PERCOCET/ROXICET)  5-325 MG per tablet (not administered)  sodium chloride 0.9 % bolus 1,000 mL (1,000 mLs Intravenous New Bag/Given 11/15/16 1507)  dextrose 5 %-0.45 % sodium chloride infusion (not administered)  insulin regular (NOVOLIN R,HUMULIN R) 100 Units in sodium chloride 0.9 % 100 mL (1 Units/mL) infusion (10.8 Units/hr Intravenous Rate/Dose Change 11/15/16 1514)  0.9 %  sodium chloride infusion (not administered)  lactated ringers bolus 1,000 mL (not administered)  sodium chloride 0.9 % bolus 1,000 mL (0 mLs Intravenous Stopped 11/15/16 1508)     Initial Impression / Assessment and Plan / ED Course  I have reviewed the triage vital signs and the nursing notes.  Pertinent labs & imaging results that were available during my care of the patient were reviewed by me and considered in my medical decision making (see chart for details).     Patient seen and examined. Patient discussed with and seen by Dr. Madilyn Hook.   Vital signs reviewed and are as follows: BP 122/65   Pulse 88   Temp 98 F (36.7 C) (Oral)   Resp 17   SpO2 100%   Plan: fluids, glucostabilizer. Vital signs stable. Normal mentation.   Given substantial abd pain, CT ordered. This was negative. MRI pending to r/o infectious etiology of back pain.   4:06 PM Discussed case with Internal Medicine teaching service who will see.   Patient re-examined and is stable, updated on results.   Blood pressure 122/65, pulse 88, temperature 98 F (36.7 C), temperature source Oral, resp. rate 17, SpO2 100 %.  CRITICAL CARE Performed by: Carolee Rota Total critical care time: 35 minutes Critical care time was exclusive of separately billable procedures and treating other patients. Critical care was necessary to  treat or prevent imminent or life-threatening deterioration. Critical care was time spent personally by me on the following activities: development of treatment plan with patient and/or surrogate as well as nursing, discussions with  consultants, evaluation of patient's response to treatment, examination of patient, obtaining history from patient or surrogate, ordering and performing treatments and interventions, ordering and review of laboratory studies, ordering and review of radiographic studies, pulse oximetry and re-evaluation of patient's condition.   Final Clinical Impressions(s) / ED Diagnoses   Final diagnoses:  Diabetic ketoacidosis without coma associated with type 1 diabetes mellitus (HCC)   Admit.   New Prescriptions New Prescriptions   No medications on file     Renne Crigler, Cordelia Poche 11/15/16 1607    Tilden Fossa, MD 11/16/16 [phone]

## 2016-11-15 NOTE — ED Notes (Signed)
Insulin rate change verified with Vance Peper RN

## 2016-11-15 NOTE — ED Notes (Signed)
Patient transported to MRI 

## 2016-11-15 NOTE — ED Notes (Signed)
Pt attempted to use bedpan but was unable to urinate

## 2016-11-16 ENCOUNTER — Observation Stay (HOSPITAL_COMMUNITY): Payer: Self-pay

## 2016-11-16 DIAGNOSIS — R159 Full incontinence of feces: Secondary | ICD-10-CM

## 2016-11-16 DIAGNOSIS — M25551 Pain in right hip: Secondary | ICD-10-CM

## 2016-11-16 DIAGNOSIS — M25552 Pain in left hip: Secondary | ICD-10-CM

## 2016-11-16 DIAGNOSIS — E860 Dehydration: Secondary | ICD-10-CM

## 2016-11-16 LAB — BASIC METABOLIC PANEL
ANION GAP: 8 (ref 5–15)
ANION GAP: 6 (ref 5–15)
ANION GAP: 9 (ref 5–15)
Anion gap: 7 (ref 5–15)
BUN: 45 mg/dL — ABNORMAL HIGH (ref 6–20)
BUN: 47 mg/dL — ABNORMAL HIGH (ref 6–20)
BUN: 42 mg/dL — ABNORMAL HIGH (ref 6–20)
BUN: 45 mg/dL — AB (ref 6–20)
CO2: 21 mmol/L — ABNORMAL LOW (ref 22–32)
CO2: 19 mmol/L — ABNORMAL LOW (ref 22–32)
CO2: 21 mmol/L — ABNORMAL LOW (ref 22–32)
CO2: 22 mmol/L (ref 22–32)
Calcium: 8.1 mg/dL — ABNORMAL LOW (ref 8.9–10.3)
Calcium: 8.2 mg/dL — ABNORMAL LOW (ref 8.9–10.3)
Calcium: 8.3 mg/dL — ABNORMAL LOW (ref 8.9–10.3)
Calcium: 8.2 mg/dL — ABNORMAL LOW (ref 8.9–10.3)
Chloride: 106 mmol/L (ref 101–111)
Chloride: 109 mmol/L (ref 101–111)
Chloride: 107 mmol/L (ref 101–111)
Chloride: 106 mmol/L (ref 101–111)
Creatinine, Ser: 2.59 mg/dL — ABNORMAL HIGH (ref 0.44–1.00)
Creatinine, Ser: 2.51 mg/dL — ABNORMAL HIGH (ref 0.44–1.00)
Creatinine, Ser: 2.46 mg/dL — ABNORMAL HIGH (ref 0.44–1.00)
Creatinine, Ser: 2.12 mg/dL — ABNORMAL HIGH (ref 0.44–1.00)
GFR calc Af Amer: 24 mL/min — ABNORMAL LOW (ref >60)
GFR calc Af Amer: 25 mL/min — ABNORMAL LOW (ref >60)
GFR calc Af Amer: 25 mL/min — ABNORMAL LOW (ref >60)
GFR calc non Af Amer: 21 mL/min — ABNORMAL LOW (ref >60)
GFR calc non Af Amer: 21 mL/min — ABNORMAL LOW (ref >60)
GFR calc non Af Amer: 22 mL/min — ABNORMAL LOW (ref >60)
GFR, EST AFRICAN AMERICAN: 30 mL/min — AB (ref >60)
GFR, EST NON AFRICAN AMERICAN: 26 mL/min — AB (ref >60)
GLUCOSE: 239 mg/dL — AB (ref 65–99)
Glucose, Bld: 228 mg/dL — ABNORMAL HIGH (ref 65–99)
Glucose, Bld: 102 mg/dL — ABNORMAL HIGH (ref 65–99)
Glucose, Bld: 318 mg/dL — ABNORMAL HIGH (ref 65–99)
POTASSIUM: 5.3 mmol/L — AB (ref 3.5–5.1)
POTASSIUM: 5.2 mmol/L — AB (ref 3.5–5.1)
Potassium: 2.8 mmol/L — ABNORMAL LOW (ref 3.5–5.1)
Potassium: 4.1 mmol/L (ref 3.5–5.1)
SODIUM: 134 mmol/L — AB (ref 135–145)
Sodium: 136 mmol/L (ref 135–145)
Sodium: 137 mmol/L (ref 135–145)
Sodium: 134 mmol/L — ABNORMAL LOW (ref 135–145)

## 2016-11-16 LAB — TROPONIN I
Troponin I: 0.64 ng/mL (ref <0.03)
Troponin I: 0.47 ng/mL (ref <0.03)

## 2016-11-16 LAB — RAPID URINE DRUG SCREEN, HOSP PERFORMED
Amphetamines: NOT DETECTED
Barbiturates: NOT DETECTED
Benzodiazepines: NOT DETECTED
COCAINE: NOT DETECTED
OPIATES: POSITIVE — AB
Tetrahydrocannabinol: NOT DETECTED

## 2016-11-16 LAB — GLUCOSE, CAPILLARY
Glucose-Capillary: 102 mg/dL — ABNORMAL HIGH (ref 65–99)
Glucose-Capillary: 105 mg/dL — ABNORMAL HIGH (ref 65–99)
Glucose-Capillary: 107 mg/dL — ABNORMAL HIGH (ref 65–99)
Glucose-Capillary: 135 mg/dL — ABNORMAL HIGH (ref 65–99)
Glucose-Capillary: 150 mg/dL — ABNORMAL HIGH (ref 65–99)
Glucose-Capillary: 163 mg/dL — ABNORMAL HIGH (ref 65–99)
Glucose-Capillary: 211 mg/dL — ABNORMAL HIGH (ref 65–99)
Glucose-Capillary: 272 mg/dL — ABNORMAL HIGH (ref 65–99)
Glucose-Capillary: 363 mg/dL — ABNORMAL HIGH (ref 65–99)
Glucose-Capillary: 91 mg/dL (ref 65–99)
Glucose-Capillary: 96 mg/dL (ref 65–99)

## 2016-11-16 LAB — CBC
HCT: 35.5 % — ABNORMAL LOW (ref 36.0–46.0)
Hemoglobin: 11.9 g/dL — ABNORMAL LOW (ref 12.0–15.0)
MCH: 28.5 pg (ref 26.0–34.0)
MCHC: 33.5 g/dL (ref 30.0–36.0)
MCV: 84.9 fL (ref 78.0–100.0)
Platelets: 254 10*3/uL (ref 150–400)
RBC: 4.18 MIL/uL (ref 3.87–5.11)
RDW: 13.1 % (ref 11.5–15.5)
WBC: 16.7 10*3/uL — ABNORMAL HIGH (ref 4.0–10.5)

## 2016-11-16 LAB — HIV ANTIBODY (ROUTINE TESTING W REFLEX): HIV SCREEN 4TH GENERATION: NONREACTIVE

## 2016-11-16 LAB — MRSA PCR SCREENING: MRSA by PCR: NEGATIVE

## 2016-11-16 MED ORDER — POTASSIUM CHLORIDE 10 MEQ/50ML IV SOLN: 10.0000 meq | INTRAVENOUS | Status: DC

## 2016-11-16 MED ORDER — MORPHINE SULFATE (PF) 4 MG/ML IV SOLN
2.0000 mg | Freq: Four times a day (QID) | INTRAVENOUS | Status: DC | PRN
  Administered 2016-11-17 – 2016-11-19 (×8): 2 mg via INTRAVENOUS
  Filled 2016-11-16 (×8): qty 1

## 2016-11-16 MED ORDER — INSULIN ASPART 100 UNIT/ML ~~LOC~~ SOLN
0.0000 [IU] | SUBCUTANEOUS | Status: DC
Start: 2016-11-16 — End: 2016-11-22
  Administered 2016-11-16: 5 [IU] via SUBCUTANEOUS
  Administered 2016-11-16: 9 [IU] via SUBCUTANEOUS
  Administered 2016-11-17 (×2): 5 [IU] via SUBCUTANEOUS
  Administered 2016-11-17 – 2016-11-18 (×4): 3 [IU] via SUBCUTANEOUS
  Administered 2016-11-18: 9 [IU] via SUBCUTANEOUS
  Administered 2016-11-18 (×3): 3 [IU] via SUBCUTANEOUS
  Administered 2016-11-19: 2 [IU] via SUBCUTANEOUS
  Administered 2016-11-19 (×2): 1 [IU] via SUBCUTANEOUS
  Administered 2016-11-19: 7 [IU] via SUBCUTANEOUS
  Administered 2016-11-19: 3 [IU] via SUBCUTANEOUS
  Administered 2016-11-20: 2 [IU] via SUBCUTANEOUS
  Administered 2016-11-20: 7 [IU] via SUBCUTANEOUS
  Administered 2016-11-20: 2 [IU] via SUBCUTANEOUS
  Administered 2016-11-20: 7 [IU] via SUBCUTANEOUS
  Administered 2016-11-20: 2 [IU] via SUBCUTANEOUS
  Administered 2016-11-21: 3 [IU] via SUBCUTANEOUS
  Administered 2016-11-21: 5 [IU] via SUBCUTANEOUS
  Administered 2016-11-21: 3 [IU] via SUBCUTANEOUS
  Administered 2016-11-21: 2 [IU] via SUBCUTANEOUS
  Administered 2016-11-21: 3 [IU] via SUBCUTANEOUS
  Administered 2016-11-21 – 2016-11-22 (×2): 2 [IU] via SUBCUTANEOUS

## 2016-11-16 MED ORDER — POTASSIUM CHLORIDE 10 MEQ/100ML IV SOLN
10.0000 meq | INTRAVENOUS | Status: AC
  Administered 2016-11-16 (×4): 10 meq via INTRAVENOUS
  Filled 2016-11-16 (×4): qty 100

## 2016-11-16 MED ORDER — GABAPENTIN 300 MG PO CAPS: 300.0000 mg | ORAL_CAPSULE | Freq: Every day | ORAL | Status: DC

## 2016-11-16 MED ORDER — SODIUM CHLORIDE 0.9 % IV BOLUS (SEPSIS)
1000.0000 mL | Freq: Once | INTRAVENOUS | Status: AC
  Administered 2016-11-16: 1000 mL via INTRAVENOUS

## 2016-11-16 MED ORDER — INSULIN GLARGINE 100 UNIT/ML ~~LOC~~ SOLN
22.0000 [IU] | Freq: Every day | SUBCUTANEOUS | Status: DC
  Administered 2016-11-16 – 2016-11-17 (×2): 22 [IU] via SUBCUTANEOUS
  Filled 2016-11-16 (×2): qty 0.22

## 2016-11-16 MED ORDER — ATORVASTATIN CALCIUM 40 MG PO TABS
40.0000 mg | ORAL_TABLET | Freq: Every day | ORAL | Status: DC
  Administered 2016-11-16 – 2016-11-22 (×7): 40 mg via ORAL
  Filled 2016-11-16 (×7): qty 1

## 2016-11-16 MED ORDER — AMLODIPINE BESYLATE 10 MG PO TABS
10.0000 mg | ORAL_TABLET | Freq: Every day | ORAL | Status: DC
  Administered 2016-11-16 – 2016-11-22 (×7): 10 mg via ORAL
  Filled 2016-11-16 (×7): qty 1

## 2016-11-16 MED ORDER — POTASSIUM CHLORIDE 2 MEQ/ML IV SOLN: INTRAVENOUS | Status: DC

## 2016-11-16 MED ORDER — KCL IN DEXTROSE-NACL 40-5-0.45 MEQ/L-%-% IV SOLN
INTRAVENOUS | Status: DC
  Administered 2016-11-16 (×2): via INTRAVENOUS
  Filled 2016-11-16 (×2): qty 1000

## 2016-11-16 MED ORDER — POTASSIUM CHLORIDE CRYS ER 20 MEQ PO TBCR
40.0000 meq | EXTENDED_RELEASE_TABLET | Freq: Once | ORAL | Status: AC
  Administered 2016-11-16: 40 meq via ORAL
  Filled 2016-11-16: qty 2

## 2016-11-16 MED ORDER — MORPHINE SULFATE (PF) 4 MG/ML IV SOLN
2.0000 mg | Freq: Once | INTRAVENOUS | Status: AC
  Administered 2016-11-16: 2 mg via INTRAVENOUS
  Filled 2016-11-16: qty 1

## 2016-11-16 MED ORDER — GABAPENTIN 300 MG PO CAPS
300.0000 mg | ORAL_CAPSULE | Freq: Two times a day (BID) | ORAL | Status: DC
  Administered 2016-11-16 – 2016-11-17 (×3): 300 mg via ORAL
  Filled 2016-11-16 (×3): qty 1

## 2016-11-16 MED ORDER — SODIUM CHLORIDE 0.9 % IV SOLN
INTRAVENOUS | Status: AC
  Administered 2016-11-16 – 2016-11-17 (×3): via INTRAVENOUS

## 2016-11-16 MED ORDER — HEPARIN SODIUM (PORCINE) 5000 UNIT/ML IJ SOLN
5000.0000 [IU] | Freq: Three times a day (TID) | INTRAMUSCULAR | Status: DC
  Administered 2016-11-16 – 2016-11-21 (×17): 5000 [IU] via SUBCUTANEOUS
  Filled 2016-11-16 (×17): qty 1

## 2016-11-16 NOTE — Progress Notes (Signed)
Date: 11/16/2016  Patient name: Debra [name]  Medical record number: [id number]  Date of birth: 1966/02/21   I have seen and evaluated Debra [name] and discussed their care with the Residency Team. Debra [name] is a 51 yo woman with poorly controlled DM who presented with bilateral lower extremity pain, inability to ambulate, frequent falls, nausea, vomiting, diarrhea, abdominal incontinence it was incidentally found to have DKA. Her DKA was most likely triggered due to lack of insulin as the patient has no insurance and all of the insulin that she had expired years ago. She denies any sxs or findings of a UTI or a URI. She was appropriately treated for her DKA and it has since resolved.  This morning, she complained of severe pain from her lower back to her hips to her knees bilaterally. She states that she cannot move her lower extremities. She is having a difficult time getting comfortable in bed. Her significant other confirms her frequent falls. Chart review indicates that on September 4 at her office visit she told her PCP that she had fallen off a counter one month ago and that was when the pain started. The pain she described in her back and bilateral hips is similar to the pain she is describing today. She had a plain film of the bilateral hips and lumbar spine which did not show any acute abnormalities. She was prescribed gabapentin on her September 11 appointment the due to pharmacy delays, she did not pick that up until fairly recently.  Her significant other indicates that she has lost 30-40 pounds in the past few months. Her weight in March was 181 pounds and her weight September 11 was 150. He states that she has been eating normally until about the past week. He also describes frequent diarrhea.   Vitals:   11/16/16 0815 11/16/16 1118  BP: (!) 192/90 (!) 188/96  Pulse:  89  Resp:  15  Temp: 98.1 F (36.7 C) 98.1 F (36.7 C)  SpO2:  100%  T max 98.1 HR 89 RR 15 BP  188/96 Clinically ill female writhing in bed She is able to move all 4 extremities spontaneously and reposition herself in bed HRRR no MRG LCTAB with good air flow ABD + BS, soft, minimal tenderness epigastric and left upper quadrant to palpation with the stethoscope Ext no edema Neuro limited by pain and cooperation, no focal abnormalities, strength limited by pain Skin warm and dry no erythema over left hip  Cr 2.51 - 2.46, 0.7 in 2015 Co2 8 - 22 - 19 Gap 33 - 6 - 8 WBC 9.4 - 16.  UA 30 protein, no RBC or WBC Microalb 785  I personally viewed the CXR images and confirmed my reading with the official read. AP port semi erect - no acute abnl  CT no acute abnl, atherosclerotic calcifications  Incomplete Lumbar MRI : soft tissue edema in lower post Paraspinous muscle. Np canal or neural foraminal stenosis but nerve roots not imaged.   Assessment and Plan: I have seen and evaluated the patient as outlined above. I agree with the formulated Assessment and Plan as detailed in the residents' note, with the following changes:  Debra [name] is a 51 year old woman with poorly controlled type 1 diabetes who presented with pain from her bilateral lower back to her knees bilaterally with a limited MRI that showed soft tissue edema. She was found to have DKA which has been appropriately treated but is now off all IV fluids.  1. DKA - the most likely etiology is not using insulin or using expired insulin. We have ruled out a UTI. Her chest x-ray was normal but was obtained when she was lying contracted. Once her pain is controlled, we will need to get better information about pulmonary symptoms. She is afebrile but has a leukocytosis today although overall it is not felt likely that she has an infectious process. I would obtain an EKG and a troponin I because she would be at high risk for an acute MI due to her underlying risk factors. There are notes in the chart that the clinic has worked with her to get  her insulin either via samples, through the [facility] outpatient pharmacy, or through the [employer]. She was hydrated less than 5 L and is not taking much oral intake at this time due to pain. Therefore I would continue to aggressively hydrate her to ensure that she does not slip back into DKA. We will give her insulin at discharge but it will be up to her to take responsibility to come into her clinic appointments and ensure that she does not run out of insulin.   2. Acute on chronic pain of bilateral lower back to bilateral knees - this seems to have started from a traumatic fall some time in August. She has had plain films a month after the fall that did not show any acute abnormalities. Her MRI was limited but only showed nonspecific finding of soft tissue edema. Due to the reports of bladder distention, fecal incontinence, and weight loss, it is imperative that we work up other serious etiologies. Spinal cord compression may occur at a higher level so we will obtain a thoracic MRI. Since she has continued to fall, we will obtain repeat hip films to rule out fracture or AVN. We will work on getting pain control so that we can do a better neurologic examination. She will likely need PTOT consult once her pain is controlled.   3. Acute kidney injury - her creatinine is likely prerenal due to the volume contraction due to DKA. Her creatinine has trended down but only minimally with IV fluids. Her baseline creatinine has been increasing since 2015 likely due to a combination of factors including diabetic nephropathy.  Burns Spain, MD 9/20/201812:27 PM

## 2016-11-16 NOTE — Care Management Note (Signed)
Case Management Note  Patient Details  Name: ZAMYIA SMOLENSKI MRN: [id number] Date of Birth: [birth year]  Subjective/Objective:   From home with boyfriend, pta indep, she presents with DKA , noncompliance, neuropathy, fibromyalgia, patient states she has been using insulin in her refrigerator that is 50 years old, NCM informed her that, the insulin has expired and is not good to use. She goes to internal medicine for follow up , they are closed now for lunch.  She gets her meds with the orange card [facility]. Patient states she has some new insulin in her refrigerator also.                    Action/Plan: NCM will follow for dc needs.   Expected Discharge Date:                  Expected Discharge Plan:  Home/Self Care  In-House Referral:     Discharge planning Services  CM Consult, Indigent Health Clinic, Medication Assistance  Post Acute Care Choice:    Choice offered to:     DME Arranged:    DME Agency:     HH Arranged:    HH Agency:     Status of Service:  In process, will continue to follow  If discussed at Long Length of Stay Meetings, dates discussed:    Additional Comments:  Leone Haven, RN 11/16/2016, 12:36 PM

## 2016-11-16 NOTE — Progress Notes (Signed)
CRITICAL VALUE ALERT  Critical Value:  Troponin 0.64  Date & Time Notied: 11/16/16 1623   Provider Notified: IMTS  Orders Received/Actions taken: No new orders

## 2016-11-16 NOTE — Progress Notes (Addendum)
Inpatient Diabetes Program Recommendations  AACE/ADA: New Consensus Statement on Inpatient Glycemic Control (2015)  Target Ranges:  Prepandial:   less than 140 mg/dL      Peak postprandial:   less than 180 mg/dL (1-2 hours)      Critically ill patients:  140 - 180 mg/dL   Lab Results  Component Value Date   GLUCAP 163 (H) 11/16/2016   HGBA1C >14.0 10/31/2016   Review of Glycemic Control  Diabetes history: DM 81 since 51 years old Outpatient Diabetes medications: Lantus 33 units, Novolog 10 units tid  Current orders for Inpatient glycemic control: Lantus 22 units  Inpatient Diabetes Program Recommendations:    Consider Novolog Sensitive Correction 0-9 units tid and Novolog HS scale 0-5 units while inpatient. Once patient starts eating consider addition of Novolog meal coverage in addition to Correction scale.  Spoke to patient minimally due to pain. Friend in room discussed with me patient has limited to none food stamps. He finds her in the floor from falls a lot and sometimes she can't even hardly move. Patient lost Medicaid insurance when her children aged out of the home. Patient had a renewal of the [place] card recently.  Friend showed me a picture of several insulin pens in several zip lock bags that have expiration dates all the way back to 08/2013. Patient rationing insulin.  Friend thinks patient can't even afford over the counter insulin at [facility] for $25/vial as she does not work. Patient has applied for disability multiple times without success.  Thanks,  Christena Deem RN, MSN, [facility] Inpatient Diabetes Coordinator Team Pager [phone] (8a-5p)

## 2016-11-16 NOTE — Progress Notes (Signed)
Subjective:  Overnight events: anion gap closed x2, insulin drip stopped and SQ Lantus started. This morning when seen patient was alert, but difficult to obtain history as patient was complaining of excruciating pain in bilateral hips that radiated down to the knees and she was moaning and screaming at times. Friend of patient in room and provided some information. Briefly, patient has been having difficulty affording insulin and has been using expired insulin at home. Pictures from patient's fridge showed boxes of NovoLog from 2015. Her appetite has been decreasing for some time now and she has lost 30 pounds. He's also been having frequent falls and friend states he has found her on the floor several times when he goes visit. She has been having bilateral lower extremity pain and weakness. She was recently started on gabapentin 4 days ago for pain.   Objective:  Vital signs in last 24 hours: Vitals:   11/16/16 0400 11/16/16 0739 11/16/16 0815 11/16/16 1118  BP: (!) 102/45 (!) 178/86 (!) 192/90 (!) 188/96  Pulse: 77 78  89  Resp:  (!) 22  15  Temp: 98.2 F (36.8 C) 98.1 F (36.7 C) 98.1 F (36.7 C) 98.1 F (36.7 C)  TempSrc: Oral Oral Oral Oral  SpO2: 100% 99%  100%  Weight:      Height:       General: patient in pain and unable to provide history during the encounter, moaning and screaming in pain at times Cardiac: regular rate and rhythm, nl S1/S2, II/VI systolic ejection murmur best heard at upper sternal borders Pulm: CTAB, no wheezes or crackles, no increased work of breathing  Abd: soft, NTND, hyperactive bowel sounds Neuro: alert  MSK: patient unable to move lower extremity secondary to pain. Attempted to tenderness passive range of motion but unsuccessful secondary to pain Ext: warm and well perfused, no peripheral edema, 2+ DP pulses bilaterally     Assessment/Plan:  Debra [name] is a 51 y.o. female with a history for T1DM complicated by peripheral  Neuropathy who  presented to the ED with multiple complaints including AMS, abdominal pain, N/V, diarrhea, and 2 week history of progressive weakness and pain of her lower extremities.   # DKA: in setting of using expired insulin at home. Anionic gap closed 2. Of of insulin drip and on SQ Lantus 22 units. Renal function improving.  Continue aggressive hydration and potassium repletion. Will be provided with insulin on discharge.  - NS @ 125cc/hr - 1L NS bolus today  - SQ Lantus 22U QHS + SSI-S  - CBG monitoring  - DM education   # Bilateral hip pain and weakness: unclear etiology at this time, but concern for cauda equina given report of urinary retention, fecal incontinence, lower extremity weakness, and weight loss. Lumbar spine MRI with nonspecific findings of soft tissue edema. Bilateral hip x-rays from 9/4 normal. Thoracic spine MRI of bilateral hips x-rays pending. AVN and hip fracture also in the differential.  - Follow up thoracic spine MRI and b/l hip XR  - IV morphine 2 mg q6h PRN - Gabapentin 300mg  BID. Will uptitrate if needed.  - PT/OT consult tomorrow  - Will repeat neurologic examination when better pain control   # AKI: likely prerenal in nature from dehydration in the setting of DKA. Renal function improving. Will continue aggressive hydration as above.  - Daily BMP    F: NS @ 125cc/hr E:  Will continue to monitor and replete as needed  N: CM  VTE ppx: SQ heparin   Code status: Full code, not confirmed   Dispo: Anticipated discharge in approximately 2-5 day(s) pending further treatment for DKA and workup/managenment of bilateral hip pain.   [name], MD  Internal Medicine PGY-1  P [phone]

## 2016-11-16 NOTE — Progress Notes (Signed)
Patient c/o bilateral leg pain described as burning sensation. Paged and notified IMTS. IM stated we will review medications. Awaiting new orders

## 2016-11-16 NOTE — Progress Notes (Signed)
Paged IM again in regards to resident. Informed them she is very uncomfortable and moaning out in pain. IM stated they were rounding and should be to see the patient soon.

## 2016-11-16 NOTE — Progress Notes (Signed)
Nutrition Consult/Brief Note  RD consulted for DM diet education. Education not appropriate at this time. Pt moaning out in pain. RD to re-visit at later date.  Maureen Chatters, RD, LDN Pager #: [phone] After-Hours Pager #: [phone]

## 2016-11-17 ENCOUNTER — Inpatient Hospital Stay (HOSPITAL_COMMUNITY): Payer: Self-pay

## 2016-11-17 DIAGNOSIS — R634 Abnormal weight loss: Secondary | ICD-10-CM

## 2016-11-17 LAB — BASIC METABOLIC PANEL
Anion gap: 5 (ref 5–15)
BUN: 31 mg/dL — ABNORMAL HIGH (ref 6–20)
CALCIUM: 8.2 mg/dL — AB (ref 8.9–10.3)
CO2: 19 mmol/L — ABNORMAL LOW (ref 22–32)
Chloride: 112 mmol/L — ABNORMAL HIGH (ref 101–111)
Creatinine, Ser: 1.56 mg/dL — ABNORMAL HIGH (ref 0.44–1.00)
GFR calc Af Amer: 44 mL/min — ABNORMAL LOW (ref >60)
GFR calc non Af Amer: 38 mL/min — ABNORMAL LOW (ref >60)
GLUCOSE: 171 mg/dL — AB (ref 65–99)
Potassium: 4.7 mmol/L (ref 3.5–5.1)
Sodium: 136 mmol/L (ref 135–145)

## 2016-11-17 LAB — HIV ANTIBODY (ROUTINE TESTING W REFLEX): HIV Screen 4th Generation wRfx: NONREACTIVE

## 2016-11-17 LAB — GLUCOSE, CAPILLARY
GLUCOSE-CAPILLARY: 203 mg/dL — AB (ref 65–99)
GLUCOSE-CAPILLARY: 285 mg/dL — AB (ref 65–99)
Glucose-Capillary: 111 mg/dL — ABNORMAL HIGH (ref 65–99)
Glucose-Capillary: 243 mg/dL — ABNORMAL HIGH (ref 65–99)
Glucose-Capillary: 206 mg/dL — ABNORMAL HIGH (ref 65–99)
Glucose-Capillary: 268 mg/dL — ABNORMAL HIGH (ref 65–99)

## 2016-11-17 LAB — VITAMIN B12: Vitamin B-12: 1124 pg/mL — ABNORMAL HIGH (ref 180–914)

## 2016-11-17 MED ORDER — SODIUM CHLORIDE 0.9 % IV SOLN
INTRAVENOUS | Status: AC
  Administered 2016-11-17: 13:00:00 via INTRAVENOUS

## 2016-11-17 MED ORDER — INSULIN GLARGINE 100 UNIT/ML ~~LOC~~ SOLN
25.0000 [IU] | Freq: Every day | SUBCUTANEOUS | Status: DC
  Administered 2016-11-18 – 2016-11-21 (×4): 25 [IU] via SUBCUTANEOUS
  Filled 2016-11-17 (×4): qty 0.25

## 2016-11-17 MED ORDER — GLUCERNA SHAKE PO LIQD
237.0000 mL | Freq: Three times a day (TID) | ORAL | Status: DC
  Administered 2016-11-17 – 2016-11-22 (×15): 237 mL via ORAL
  Filled 2016-11-17: qty 237

## 2016-11-17 MED ORDER — SODIUM CHLORIDE 0.9 % IV SOLN
INTRAVENOUS | Status: DC
  Administered 2016-11-18 (×2): via INTRAVENOUS

## 2016-11-17 MED ORDER — HYDROMORPHONE HCL 1 MG/ML IJ SOLN
0.5000 mg | Freq: Once | INTRAMUSCULAR | Status: AC
  Administered 2016-11-17: 0.5 mg via INTRAVENOUS
  Filled 2016-11-17: qty 0.5

## 2016-11-17 MED ORDER — GABAPENTIN 300 MG PO CAPS
600.0000 mg | ORAL_CAPSULE | Freq: Two times a day (BID) | ORAL | Status: DC
  Administered 2016-11-17 – 2016-11-20 (×6): 600 mg via ORAL
  Filled 2016-11-17 (×7): qty 2

## 2016-11-17 NOTE — Progress Notes (Signed)
  Date: 11/17/2016  Patient name: Debra [name]  Medical record number: [id number]  Date of birth: October 13, 1965   I have seen and evaluated this patient and I have discussed the plan of care with the house staff. Please see their note for complete details. I concur with their findings with the following additions/corrections: [name] is much calmer today and interactive. Lying on back, still, states cannot move her legs, confirmed the leg pain started after fall from counter about 2 months ago, steadily progressive. Confirmed 30-40 lb unintentional weight loss over 6 months. No urinary issues prior to admit. Long hx of fecal incontinence. Thinks she will be able to tolerate MRI bc pain not too bad when still. Not claustrophobic.   Plantar and dorsiflexion 5/5 B. Cannot lift legs off bed but not pressing contralateral heel into my hand when lifting leg. Better passive ROM per Dr Evelene Croon' exam.   1. DKA resolved. On insulin. On IVF when not taking much PO.  2. Acute RF - Cr improving but not yet back to baseline.  3. Subacute B LBP to B knees - MRI thoracic and lumbar spine. Check B 12 (HgB 12, MCV nl so low prob). Check RPR.   4. Weight loss - CT ABD showed no ovarian or uterine abnl nor mass in colon. Not sig anemic. CXR did not show a mass. No MMG.  Burns Spain, MD 11/17/2016, 3:26 PM

## 2016-11-17 NOTE — Progress Notes (Signed)
MD notified that patient pain only to 8 from 10 post PRN morphine administration. Will await new orders and continue to monitor patient closely.

## 2016-11-17 NOTE — Progress Notes (Addendum)
Inpatient Diabetes Program Recommendations  AACE/ADA: New Consensus Statement on Inpatient Glycemic Control (2015)  Target Ranges:  Prepandial:   less than 140 mg/dL      Peak postprandial:   less than 180 mg/dL (1-2 hours)      Critically ill patients:  140 - 180 mg/dL  Results for Debra [name], Debra [name] (MRN [id number]) as of 11/17/2016 09:38  Ref. Range 11/16/2016 07:37 11/16/2016 11:16 11/16/2016 16:10 11/16/2016 20:56 11/16/2016 21:35 11/17/2016 00:19 11/17/2016 04:25 11/17/2016 07:30  Glucose-Capillary Latest Ref Range: 65 - 99 mg/dL 578 (H) 469 (H) 629 (H) 102 (H) 91 111 (H) 243 (H) 206 (H)   Results for Debra [name], Debra [name] (MRN [id number]) as of 11/17/2016 09:38  Ref. Range 10/31/2016 10:36  Hemoglobin A1C Unknown >14.0   Review of Glycemic Control  Diabetes history: DM1 (makes NO insulin; requires basal, correction, and meal coverage insulin) Outpatient Diabetes medications: Lantus 33 units daily, Novolog 10 units TID with meals Current orders for Inpatient glycemic control: Lantus 22 units daily, Novolog 0-9 units Q4H  Inpatient Diabetes Program Recommendations: Insulin - Basal: Please increase Lantus to 25 units daily. Correction (SSI): If patient is eating well, please consider changing frequency of CBGs and Novolog correction to Novolog 0-9 units TID with meals and Novolog 0-5 units QHS. Insulin - Meal Coverage: Patient has DM1 and makes no insulin. Patient will require Novolog for carbohydrates consumed. Please consider ordering Novolog 4 units TID wtih meals for meal coverage if patient eats at least 50% of meals. HgbA1C: A1C >14% on 10/31/16 indicating an average glucose greater than 355 mg/dl. Outpatient DM medications: MD may need to make insulin adjustments at time of discharge since patient reports that her glucose is consistently over 400 mg/dl.  NOTE: Noted patient is using a set Novolog dose as an outpatient. Since patient has DM1, she makes no insulin and requires insulin for basal,  carbohydrates consumed, and correction. Patient needs to be taking meal coverage (likely much less than 10 units) to cover carbohydrates consumed and needs to also be using a Novolog correction scale to get glucose back down in target range. Will follow up with patient again today.  Addendum 11/17/16@12 :00-Spoke with patient about diabetes and home regimen for diabetes control. Patient was dx with DM1 at the age of 51 years old. Patient reports that she is [facility] and getting medications from the health department (she has the orange card).  Patient reports that she has Lantus and Novolog (that are both in date and recently obtained) and is taking insulin as prescribed.  Patient states that she checks her glucose 3-4 times per day and that it is usually over 400 mg/dl. Discussed A1C results (> 14% on 10/31/16) and explained that her current A1C indicates an average glucose over 355 mg/dl over the past 2-3 months which concurs with her report of glucose values over 400 mg/dl at home. Patient reports that she has been taking the new insulin that is in date and her glucose continues to be greater than 400 mg/dl. Discussed glucose and A1C goals. Discussed importance of checking CBGs and maintaining good CBG control to prevent long-term and short-term complications. Explained how hyperglycemia leads to damage within blood vessels which lead to the common complications seen with uncontrolled diabetes. Stressed to the patient the importance of improving glycemic control to prevent further complications from uncontrolled diabetes. Discussed impact of nutrition, exercise, stress, sickness, and medications on diabetes control. Patient states that she tries to follow carb  modified diet.  Encouraged patient to continue checking her glucose 3-4 times per day (before meals and at bedtime) and to keep a log book of glucose readings and insulin taken which she will need to take to doctor appointments.  Explained that her doctor may need to make insulin adjustments if needed. Encouraged patient to dispose of old insulin so it is not taken by mistake. Patient verbalized understanding of information discussed and she states that she has no further questions at this time related to diabetes.  Thanks, Orlando Penner, RN, MSN, CDE Diabetes Coordinator Inpatient Diabetes Program [phone] (Team Pager from 8am to 5pm)

## 2016-11-17 NOTE — Progress Notes (Signed)
Interim Note:  I called MRI again, and explained the urgent nature of the thoracic and lumbar MRI. We are hopeful that she will get the MRI by this evening as there are 2 stat MRIs in front of her.

## 2016-11-17 NOTE — Progress Notes (Signed)
Nutrition Consult/Brief Note  RD consulted for nutrition education regarding diabetes.  Lab Results  Component Value Date   HGBA1C >14.0 10/31/2016    RD provided "Carbohydrate Counting for People with Diabetes" handout from the Academy of Nutrition and Dietetics. Pt reports she tries to follow a DM diet and watch her carbohydrate portions. She sometimes drinks diet soda but mostly has water. Does not particularly like sweets. Suspect uncontrolled DM is medication related which pt also stated herself.  Expect good compliance.  Body mass index is 27.36 kg/m. Pt meets criteria for Overweight based on current BMI.  Current diet order is Carbohydrate Modified, patient is consuming approximately 100% of meals at this time.   Labs and medications reviewed. CBG's [phone].   If additional nutrition issues arise, please re-consult RD.  Maureen Chatters, RD, LDN Pager #: [phone] After-Hours Pager #: [phone]

## 2016-11-17 NOTE — Plan of Care (Signed)
Problem: Pain Managment: Goal: General experience of comfort will improve Outcome: Not Progressing Pt reports some relief from her pain with gabapentin but reports that her pain is largely unchanged by IV morphine

## 2016-11-17 NOTE — Progress Notes (Signed)
   Subjective:  No acute events overnight. Patient continues to complain of bilateral hip pain that radiates down to knees. Denies pain below the knee. Denies abdominal pain, nausea and vomiting. Endorses poor appetite, but states is has been improving. States that she had been having difficulties at home voiding and reported having to strain to be able to void. States her fecal incontinence is a chronic problem and not new.Reports 30-40 pound unintentional weight loss over the past 3-6 months.   Objective:  Vital signs in last 24 hours: Vitals:   11/16/16 1611 11/16/16 2130 11/17/16 0013 11/17/16 0331  BP: (!) 170/91 (!) 146/84 (!) 148/79 (!) 162/88  Pulse: 87 78 76 77  Resp: 15 16 (!) 6 19  Temp: 98.2 F (36.8 C) 97.9 F (36.6 C) 98.6 F (37 C) 98.8 F (37.1 C)  TempSrc: Oral Oral Oral Axillary  SpO2: 100% 100% 98% 99%  Weight:      Height:       General: lying in pain but in no acute distress, in better spirits this AM  Cardiac: regular rate and rhythm, nl S1/S2, no murmurs, rubs or gallops  Pulm: CTAB, no wheezes or crackles, no increased work of breathing  Abd: soft, NTND, bowel sounds present, no rebound tenderness, surgical scars  Neuro: A&Ox3, un able to test active ROM as patient states she it too weak and unable to move lower extremities  MSK: patient reported hip pain of passive range of motion ( leg elevation and with internal and external rotation ) Ext: warm and well perfused, no peripheral edema    Assessment/Plan:  Debra [name] is a 51 y.o. female with a history for T1DM complicated by peripheral  Neuropathy who presented to the ED with multiple complaints including AMS, abdominal pain, N/V, diarrhea, and 2 week history of progressive weakness and pain of her lower extremities.   # DKA: in setting of using expired insulin at home. Anionic gap closed 2. Of of insulin drip and on SQ Lantus 22 units. Renal function improving.  Continue aggressive hydration and  potassium repletion. Will be provided with insulin on discharge.  - NS @ 125cc/hr  - SQ Lantus 22--> 25U QHS per DM RN recs   - CBG monitoring  - DM education   # Bilateral hip pain and weakness: unclear etiology at this time, but concern for cauda equina given report of urinary retention, fecal incontinence (chronic), lower extremity weakness, and weight loss. Lumbar spine MRI with nonspecific findings of soft tissue edema but difficult study due to patient's pain.  B/l hip XR negative.   - Follow up thoracic spine MRI + repeat lumbar spine MRI. Instructed RN to give 0.5 of Dilaudid prior to MRI - F/u RPR  - IV morphine 2 mg q6h PRN - Increase gabapentin 300-->600mg  BID  - PT/OT consult pending improvement in pain   # AKI: likely prerenal in nature from dehydration in the setting of DKA. Renal function improving. Will continue aggressive hydration as above.  - Daily BMP    F: NS @ 125cc/hr E:  Will continue to monitor and replete as needed  N: CM + supplements   VTE ppx: SQ heparin   Code status: Full code, not confirmed   Dispo: Anticipated discharge in approximately 2-5 day(s) pending further treatment for DKA and workup/managenment of bilateral hip pain.   [name], MD  Internal Medicine PGY-1  P [phone]

## 2016-11-18 ENCOUNTER — Inpatient Hospital Stay (HOSPITAL_COMMUNITY): Payer: Self-pay

## 2016-11-18 LAB — BASIC METABOLIC PANEL
ANION GAP: 4 — AB (ref 5–15)
BUN: 19 mg/dL (ref 6–20)
CO2: 22 mmol/L (ref 22–32)
Calcium: 8.1 mg/dL — ABNORMAL LOW (ref 8.9–10.3)
Chloride: 110 mmol/L (ref 101–111)
Creatinine, Ser: 1.21 mg/dL — ABNORMAL HIGH (ref 0.44–1.00)
GFR, EST AFRICAN AMERICAN: 60 mL/min — AB (ref >60)
GFR, EST NON AFRICAN AMERICAN: 52 mL/min — AB (ref >60)
Glucose, Bld: 156 mg/dL — ABNORMAL HIGH (ref 65–99)
Potassium: 4 mmol/L (ref 3.5–5.1)
SODIUM: 136 mmol/L (ref 135–145)

## 2016-11-18 LAB — GLUCOSE, CAPILLARY
GLUCOSE-CAPILLARY: 209 mg/dL — AB (ref 65–99)
GLUCOSE-CAPILLARY: 403 mg/dL — AB (ref 65–99)
Glucose-Capillary: 164 mg/dL — ABNORMAL HIGH (ref 65–99)
Glucose-Capillary: 207 mg/dL — ABNORMAL HIGH (ref 65–99)
Glucose-Capillary: 244 mg/dL — ABNORMAL HIGH (ref 65–99)
Glucose-Capillary: 90 mg/dL (ref 65–99)

## 2016-11-18 LAB — SEDIMENTATION RATE: Sed Rate: 7 mm/hr (ref 0–22)

## 2016-11-18 LAB — RPR: RPR Ser Ql: NONREACTIVE

## 2016-11-18 LAB — CK: Total CK: 52 U/L (ref 38–234)

## 2016-11-18 MED ORDER — HYDROMORPHONE HCL 1 MG/ML IJ SOLN
0.5000 mg | Freq: Once | INTRAMUSCULAR | Status: AC
  Administered 2016-11-18: 0.5 mg via INTRAVENOUS

## 2016-11-18 MED ORDER — SODIUM CHLORIDE 0.9 % IV SOLN
INTRAVENOUS | Status: AC
  Administered 2016-11-18: 125 mL/h via INTRAVENOUS
  Administered 2016-11-18 – 2016-11-19 (×2): via INTRAVENOUS

## 2016-11-18 MED ORDER — INSULIN ASPART 100 UNIT/ML ~~LOC~~ SOLN
5.0000 [IU] | Freq: Once | SUBCUTANEOUS | Status: AC
  Administered 2016-11-18: 5 [IU] via SUBCUTANEOUS

## 2016-11-18 MED ORDER — KETOROLAC TROMETHAMINE 30 MG/ML IJ SOLN
30.0000 mg | Freq: Four times a day (QID) | INTRAMUSCULAR | Status: DC
  Administered 2016-11-18 – 2016-11-22 (×17): 30 mg via INTRAVENOUS
  Filled 2016-11-18 (×18): qty 1

## 2016-11-18 MED ORDER — HYDROMORPHONE HCL 1 MG/ML IJ SOLN
INTRAMUSCULAR | Status: AC
  Filled 2016-11-18: qty 1

## 2016-11-18 MED ORDER — HYDROMORPHONE HCL 1 MG/ML IJ SOLN
0.5000 mg | Freq: Once | INTRAMUSCULAR | Status: AC
  Administered 2016-11-18: 0.5 mg via INTRAVENOUS
  Filled 2016-11-18: qty 1

## 2016-11-18 MED ORDER — METOPROLOL SUCCINATE ER 25 MG PO TB24
25.0000 mg | ORAL_TABLET | Freq: Every day | ORAL | Status: DC
  Administered 2016-11-18 – 2016-11-19 (×2): 25 mg via ORAL
  Filled 2016-11-18 (×2): qty 1

## 2016-11-18 MED ORDER — INSULIN ASPART 100 UNIT/ML ~~LOC~~ SOLN
4.0000 [IU] | Freq: Three times a day (TID) | SUBCUTANEOUS | Status: DC
  Administered 2016-11-18 – 2016-11-20 (×6): 4 [IU] via SUBCUTANEOUS

## 2016-11-18 MED ORDER — GADOBENATE DIMEGLUMINE 529 MG/ML IV SOLN
15.0000 mL | Freq: Once | INTRAVENOUS | Status: AC
  Administered 2016-11-18: 15 mL via INTRAVENOUS

## 2016-11-18 MED ORDER — HYDRALAZINE HCL 20 MG/ML IJ SOLN
5.0000 mg | Freq: Once | INTRAMUSCULAR | Status: AC
  Administered 2016-11-18: 5 mg via INTRAVENOUS
  Filled 2016-11-18: qty 1

## 2016-11-18 NOTE — Progress Notes (Signed)
   Subjective:  No acute events overnight. Patient continues to complain of bilateral hip pain that radiates down to knees. Denies pain below the knee. Denies abdominal pain, nausea and vomiting. Appetite improving. States she has not been able to get out of bed due to weakness and having a difficult time moving her legs.  Objective:  Vital signs in last 24 hours: Vitals:   11/18/16 0040 11/18/16 0335 11/18/16 0440 11/18/16 0441  BP: (!) 178/90 (!) 177/89 (!) 185/96   Pulse: 73 70 74 72  Resp:   18 18  Temp:   97.7 F (36.5 C)   TempSrc:   Oral   SpO2:   100% 100%  Weight:   167 lb 15.9 oz (76.2 kg)   Height:       General: lying in pain but in no acute distress, in better spirits this AM  Cardiac: regular rate and rhythm, nl S1/S2, no murmurs, rubs or gallops  Pulm: CTAB, no wheezes or crackles, no increased work of breathing  Abd: soft, NTND, bowel sounds present   Neuro: A&Ox3, areflexia of LE bilaterally, unable to test active ROM as patient states she it too weak and unable to move lower extremities, passive ROM same as yesterday  MSK: patient reported hip pain of passive range of motion ( leg elevation and with internal and external rotation ) Ext: warm and well perfused, no peripheral edema    Assessment/Plan:  Debra [name] is a 51 y.o. female with a history for T1DM complicated by peripheral  Neuropathy who presented to the ED with multiple complaints including AMS, abdominal pain, N/V, diarrhea, and 2 week history of progressive weakness and pain of her lower extremities.   # Bilateral hip pain and weakness: unclear etiology at this time. Cauda equina ruled out. MRI showed abnormal signaling in thoracic spinal cord and recommended MRI with contrast for further evaluation. Patient was seen by PT today who recommended SNF.   - MRI thoracic with contrast. Per neurology, call if abnormal findings.  - IV morphine 2 mg q6h PRN + IV Toradol 30 mg q6h PRN  - Continue gabapentin  600mg  BID   # DKA: Resolved  # T1DM, uncontrolled  - NS @ 125cc/hr  - SQ Lantus 25U QHS + Novolog 4U TID with meals  - SSI-S  - CBG monitoring  - DM education   # AKI: likely prerenal in nature from dehydration in the setting of DKA. Renal function improving. Will continue hydration as above.  - Daily BMP   # HTN:  - Continue home amlodipine 10 mg QD  - Resume home metoprolol at lower dose of 25 mg QD  - Continue to hold home lisinopril-HCTZ 40-25 mg QD   F: NS @ 125cc/hr E:  Will continue to monitor and replete as needed  N: CM + supplements   VTE ppx: SQ heparin   Code status: Full code, not confirmed   Dispo: Anticipated discharge in approximately 2-5 day(s) pending further treatment for DKA and workup/managenment of bilateral hip pain.   [name], MD  Internal Medicine PGY-1  P [phone]

## 2016-11-18 NOTE — Evaluation (Signed)
Physical Therapy Evaluation Patient Details Name: Debra [name] MRN: [id number] DOB: February 10, 1966 Today's Date: 11/18/2016   History of Present Illness  Pt is a 51 y/o female presenting with generalized weakness with an initial glucose of 998, admitted for DKA. PMH including but not limited to DM type 1, HLD, HTN and CVA in 1990.  Clinical Impression  Pt presented supine in bed with HOB elevated, awake and willing to participate in therapy session. Prior to admission, pt reported that she was previously independent with all functional mobility and ADLs until a fall at home approximately three weeks ago. Since then pt reported that she has been having difficulty ambulating and performing bathing/dressing tasks. Pt lives with her boyfriend who works during the day and is therefore at home a lot by herself. Pt currently requires min A for bed mobility and mod-max A for transfers with RW. Pt very limited secondary to fatigue and reported 9/10 pain in bilateral LEs with weight bearing. Pt would continue to benefit from skilled physical therapy services at this time while admitted and after d/c to address the below listed limitations in order to improve overall safety and independence with functional mobility.     Follow Up Recommendations SNF;Supervision/Assistance - 24 hour    Equipment Recommendations  None recommended by PT;Other (comment) (defer to next venue)    Recommendations for Other Services       Precautions / Restrictions Precautions Precautions: Fall Restrictions Weight Bearing Restrictions: No      Mobility  Bed Mobility Overal bed mobility: Needs Assistance Bed Mobility: Rolling;Sidelying to Sit;Sit to Sidelying Rolling: Min guard Sidelying to sit: Min guard     Sit to sidelying: Min assist General bed mobility comments: increased time and effort, use of bed rails, cueing for technique, assist with bilateral LEs to return onto bed  Transfers Overall transfer level:  Needs assistance Equipment used: Rolling walker (2 wheeled) Transfers: Sit to/from Stand Sit to Stand: Max assist;From elevated surface;Mod assist         General transfer comment: pt unsuccessful on first attempt to stand from bed at standard height, even with max A pt only minimally clearing buttock off of bed. With bed in elevated position, pt able to achieve full standing position with mod A  Ambulation/Gait             General Gait Details: attempted side steps at EOB, however, pt unable to at this time secondary to reported 9/10 pain and weakness  Stairs            Wheelchair Mobility    Modified Rankin (Stroke Patients Only)       Balance Overall balance assessment: Needs assistance Sitting-balance support: Feet supported Sitting balance-Leahy Scale: Fair     Standing balance support: During functional activity;Bilateral upper extremity supported Standing balance-Leahy Scale: Poor Standing balance comment: pt reliant on bilateral UEs on RW; pt only able to tolerate standing for approximately 30 seconds secondary to reported 9/10 pain in bilateral LEs                             Pertinent Vitals/Pain Pain Assessment: 0-10 Pain Score: 9  Pain Location: bilateral LEs (R>L) Pain Descriptors / Indicators: Sore;Grimacing;Guarding Pain Intervention(s): Monitored during session;Repositioned    Home Living Family/patient expects to be discharged to:: Private residence Living Arrangements: Spouse/significant other Available Help at Discharge: Family;Friend(s);Available PRN/intermittently Type of Home: House Home Access: Stairs to enter   Entrance  Stairs-Number of Steps: 1 Home Layout: One level Home Equipment: Walker - 2 wheels;Wheelchair - manual;Cane - single point      Prior Function Level of Independence: Independent         Comments: prior to her fall ~3 weeks ago pt was independent; since then she has had great difficulty with  ambulating inside her home and performing ADLs.     Hand Dominance        Extremity/Trunk Assessment   Upper Extremity Assessment Upper Extremity Assessment: Overall WFL for tasks assessed    Lower Extremity Assessment Lower Extremity Assessment: Generalized weakness;RLE deficits/detail;LLE deficits/detail RLE Deficits / Details: MMT revealed 2/5 for hip flexion, knee extension, knee flexion and ankle DF. Pt very limited functionally as well. Pt with impaired sensation to light touch throughout. RLE: Unable to fully assess due to pain RLE Sensation: decreased light touch RLE Coordination: decreased fine motor;decreased gross motor LLE Deficits / Details: MMT revealed 2/5 for hip flexion, knee flexion, knee extension and ankle DF. Pt very limited functionally as well. Pt with impaired sensation to light touch throughout. LLE: Unable to fully assess due to pain LLE Sensation: decreased light touch LLE Coordination: decreased fine motor;decreased gross motor    Cervical / Trunk Assessment Cervical / Trunk Assessment: Normal  Communication   Communication: No difficulties  Cognition Arousal/Alertness: Awake/alert Behavior During Therapy: Flat affect Overall Cognitive Status: Within Functional Limits for tasks assessed                                 General Comments: cognition not formally assessed but WNL for general conversation      General Comments      Exercises     Assessment/Plan    PT Assessment Patient needs continued PT services  PT Problem List Decreased strength;Decreased range of motion;Decreased activity tolerance;Decreased balance;Decreased mobility;Decreased coordination;Decreased knowledge of use of DME;Decreased safety awareness;Decreased knowledge of precautions;Pain;Impaired sensation       PT Treatment Interventions DME instruction;Gait training;Stair training;Functional mobility training;Therapeutic activities;Therapeutic  exercise;Balance training;Neuromuscular re-education;Patient/family education    PT Goals (Current goals can be found in the Care Plan section)  Acute Rehab PT Goals Patient Stated Goal: return to PLOF PT Goal Formulation: With patient Time For Goal Achievement: 12/02/16 Potential to Achieve Goals: Fair    Frequency Min 3X/week   Barriers to discharge Decreased caregiver support      Co-evaluation               AM-PAC PT "6 Clicks" Daily Activity  Outcome Measure Difficulty turning over in bed (including adjusting bedclothes, sheets and blankets)?: A Lot Difficulty moving from lying on back to sitting on the side of the bed? : A Lot Difficulty sitting down on and standing up from a chair with arms (e.g., wheelchair, bedside commode, etc,.)?: Unable Help needed moving to and from a bed to chair (including a wheelchair)?: Total Help needed walking in hospital room?: Total Help needed climbing 3-5 steps with a railing? : Total 6 Click Score: 8    End of Session Equipment Utilized During Treatment: Gait belt Activity Tolerance: Patient limited by fatigue;Patient limited by pain Patient left: in bed;with call bell/phone within reach;with bed alarm set Nurse Communication: Mobility status PT Visit Diagnosis: Other abnormalities of gait and mobility (R26.89);Pain Pain - Right/Left:  (bilateral) Pain - part of body: Leg    Time: 3295-1884 PT Time Calculation (min) (ACUTE ONLY): 20 min  Charges:   PT Evaluation $PT Eval Moderate Complexity: 1 Mod     PT G Codes:        Foss, PT, DPT [phone]   [name] Jalyiah Shelley 11/18/2016, 11:47 AM

## 2016-11-19 DIAGNOSIS — E1142 Type 2 diabetes mellitus with diabetic polyneuropathy: Secondary | ICD-10-CM

## 2016-11-19 DIAGNOSIS — G959 Disease of spinal cord, unspecified: Secondary | ICD-10-CM

## 2016-11-19 LAB — BASIC METABOLIC PANEL
Anion gap: 5 (ref 5–15)
BUN: 16 mg/dL (ref 6–20)
CO2: 24 mmol/L (ref 22–32)
Calcium: 8 mg/dL — ABNORMAL LOW (ref 8.9–10.3)
Chloride: 106 mmol/L (ref 101–111)
Creatinine, Ser: 0.99 mg/dL (ref 0.44–1.00)
GFR calc Af Amer: >60 mL/min (ref >60)
GFR calc non Af Amer: >60 mL/min (ref >60)
Glucose, Bld: 129 mg/dL — ABNORMAL HIGH (ref 65–99)
POTASSIUM: 3.7 mmol/L (ref 3.5–5.1)
Sodium: 135 mmol/L (ref 135–145)

## 2016-11-19 LAB — GLUCOSE, CAPILLARY
GLUCOSE-CAPILLARY: 181 mg/dL — AB (ref 65–99)
Glucose-Capillary: 131 mg/dL — ABNORMAL HIGH (ref 65–99)
Glucose-Capillary: 84 mg/dL (ref 65–99)
Glucose-Capillary: 208 mg/dL — ABNORMAL HIGH (ref 65–99)

## 2016-11-19 MED ORDER — METOPROLOL SUCCINATE ER 50 MG PO TB24
50.0000 mg | ORAL_TABLET | Freq: Every day | ORAL | Status: DC
  Administered 2016-11-20 – 2016-11-22 (×3): 50 mg via ORAL
  Filled 2016-11-19 (×3): qty 1

## 2016-11-19 MED ORDER — METOPROLOL SUCCINATE ER 25 MG PO TB24
25.0000 mg | ORAL_TABLET | Freq: Once | ORAL | Status: AC
  Administered 2016-11-19: 25 mg via ORAL
  Filled 2016-11-19: qty 1

## 2016-11-19 NOTE — Progress Notes (Signed)
Subjective:  No acute events overnight.  Patient says she is feeling better and was sitting on the bed today with feet hanging down. She said she has not tried to walk yet- I encouraged her to continue to work with physical therapy today.  She endorses good appetite.  Patient had 1+ patellar reflexes today  SHe says that the toradol is helping a lot with her symptoms. The morphine is making her dizzy and she requested to stop it- we will stop the morphine today      Objective:  Vital signs in last 24 hours: Vitals:   11/18/16 1520 11/18/16 2143 11/19/16 0521 11/19/16 0800  BP: (!) 167/81 (!) 154/69 (!) 180/97   Pulse: 78 74 69   Resp: '18 18 16   '$ Temp: 97.8 F (36.6 C) 98.6 F (37 C) 98.4 F (36.9 C)   TempSrc: Oral Oral Oral   SpO2: 99% 100% 100% 100%  Weight:      Height:       General: sitting up on the edge of the bed  With feet down Cardiac: regular rate and rhythm, nl S1/S2, no murmurs, rubs or gallops  Pulm: CTAB, no wheezes or crackles, no increased work of breathing  Abd: soft, NTND, bowel sounds present   Neuro: A&Ox3,  1+ patellar reflexes today  Ext: warm and well perfused, no peripheral edema    Assessment/Plan:  Debra [name] is a 51 y.o. 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   Bilateral hip pain and weakness:  Cauda equina ruled out. MRI thoracic reordered with contrast, and continued to show a non-enhancing lesion at T10. Neurology was consulted and recommended no further intervention at this point as the lesion did not enhance and there was no mass effect. ESR and CK were normal. Likely her pain is inflammatory which is being treated by toradol currently. Patient was seen by PT who recommended SNF.    -continue toradol 30 mg q6 hours IV -stop morphine  today -continue PT work, and consult CSW for SNF - Continue gabapentin '600mg'$  BID   T1DM, uncontrolled: Presented with DKA, now resolved. Patient reports that her diet and appetite is better today. We will continue hydration until her diet is back to her baseline, and also given that she received IV contrast yesterday, we will hydrate her today. CBGs have been stable.   - NS @ 125cc/hr  - SQ Lantus 25U QHS + Novolog 4U TID with meals  - SSI-S   # AKI: likely prerenal in nature from dehydration in the setting of DKA. Renal function has improved and 0.99 today -BMET tomorrow  # HTN: Patient continues to be hypertensive as her blood pressure was 209 systolic today despite starting metoprolol yesterday. We will increase her metoprolol today. Given that she already received 25 mg metoprolol this morning, we will give her an additional 25 mg metoprolol today, and continue metoprolol 50 mg daily tomorrow daily.  - Continue home amlodipine 10 mg QD  - Increase metoprolol to 50 mg daily  - Continue to hold home lisinopril-HCTZ 40-25 mg QD- can restart tomorrow   F: NS @ 125cc/hr E:  Will continue to monitor and replete as needed  N: CM + supplements   VTE ppx: SQ heparin   Code status: Full code, not confirmed  Dispo: Anticipated discharge in approximately 2 day(s) pending SNF placement  Signed Burgess Estelle, MD, MPH IMTS [phone]

## 2016-11-19 NOTE — Consult Note (Signed)
Neurology Consult Referring physician: BUTCHER, E    Chief Complaint: Progressive weakness of lower extremities and back pain.  HPI: Debra [name] is an 51 y.o. female with a history of type 1 diabetes mellitus, peripheral neuropathy, stroke in 1990, hypertension hyperlipidemia and fibromyalgia admitted on 11/15/2016 for progressive weakness and difficulty with ambulation over the past 2-3 weeks. She gives a history of falling about 3 weeks ago. She had no problems with lower extremity weakness or back pain prior to falling. She's had no change in bowel or bladder trouble. She describes a burning type sensation in her lower back as well as tenderness involving her hips. MRI of the lumbar spine showed paraspinal muscle edema indicative of myositis/rhabdomyolysis or possible strain. Thoracic spine MRI showed a 3 x 13 mm intramedullary non-enhancing lesion. Etiologic considerations included syrinx, transverse myelitis or chronic myelomalacia.   Past Medical History:  Diagnosis Date  . Adenomatous colonic polyps   . Anemia    2005  . Anxiety    1990  . Arthritis   . Asthma    2000  . Cataract   . Depression 1990  . Difficult intubation    narrow airway  . Gastroesophageal Reflux Disease (GERD)   . Heart murmur    Birth  . Hyperlipidemia    2005  . Hypertension    1998  . Internal hemorrhoids 04/24/06   on colonoscopy  . Migraine   . Neuropathy of the hands & feet   . Restless Leg Syndrome   . Right Ankle Fracture 10/06/2013  . Sleep paralysis   . Stable angina pectoris    2007: cath showing normal cors.   . Stroke 1990  . Type I Diabetes Mellitus 1988    Past Surgical History:  Procedure Laterality Date  . bilateral foot surgery    . BREAST SURGERY Left    biopsy left breast  . CARDIAC CATHETERIZATION  2007   around 2007 or 2008  . CESAREAN SECTION    . CHOLECYSTECTOMY    . COLONOSCOPY W/ BIOPSIES AND POLYPECTOMY    . DILATION AND CURETTAGE OF UTERUS  12/29/2010    Surgeon: Osborne Oman, MD;  Location: [facility];  Service: Gynecology  . ENDOMETRIAL ABLATION W/ NOVASURE N/A 12/2009  . EYE SURGERY    . HYSTEROSCOPY  12/29/2010   Procedure: HYSTEROSCOPY WITH HYDROTHERMAL ABLATION;  Surgeon: Osborne Oman, MD;  Location: [facility];  Service: Gynecology;;  . IUD REMOVAL  12/29/2010   Procedure: INTRAUTERINE DEVICE (IUD) REMOVAL;  Surgeon: Osborne Oman, MD;  Location: [facility];  Service: Gynecology;  Laterality: N/A;  . LAPAROSCOPIC TUBAL LIGATION  12/29/2010   Procedure: LAPAROSCOPIC TUBAL LIGATION;  Surgeon: Osborne Oman, MD;  Location: [facility];  Service: Gynecology;  Laterality: Bilateral;  . ORIF ANKLE FRACTURE Right 10/09/2013   Procedure: Open reduction internal fixation right ankle;  Surgeon: Nita Sells, MD;  Location: [facility];  Service: Orthopedics;  Laterality: Right;  Open reduction internal fixation right ankle  . TRIGGER FINGER RELEASE     x 3    Family History  Problem Relation Age of Onset  . Hypertension Mother   . Kidney disease Mother   . Hypertension Father   . Breast cancer Maternal Grandmother   . Prostate cancer Maternal Grandfather   . Ovarian cancer Paternal Grandmother   . Prostate cancer Paternal Grandfather   . Colon cancer Maternal Uncle        Family history of malignant neoplasm  of gastrointestinal tract   Social History:  reports that she has never smoked. She has never used smokeless tobacco. She reports that she does not drink alcohol or use drugs.  Allergies:  Allergies  Allergen Reactions  . Penicillins Anaphylaxis, Nausea And Vomiting and Rash    Has patient had a PCN reaction causing immediate rash, facial/tongue/throat swelling, SOB or lightheadedness with hypotension: Yes Has patient had a PCN reaction causing severe rash involving mucus membranes or skin necrosis: Yes Has patient had a PCN reaction that required hospitalization No Has patient had a PCN reaction occurring within the last 10 years:  Yes If all of the above answers are "NO", then may proceed with Cephalosporin use.   . Pollen Extract     Seasonal Allergies  . Tape Rash    Medications Prior to Admission  Medication Sig Dispense Refill  . albuterol (PROVENTIL HFA;VENTOLIN HFA) 108 (90 Base) MCG/ACT inhaler Inhale 1-2 puffs into the lungs every 6 (six) hours as needed for wheezing or shortness of breath. 3 Inhaler 0  . amLODipine (NORVASC) 10 MG tablet Take 1 tablet (10 mg total) by mouth daily. 90 tablet 1  . aspirin EC 81 MG tablet Take 1 tablet (81 mg total) by mouth daily. 90 tablet 3  . atorvastatin (LIPITOR) 40 MG tablet Take 1 tablet (40 mg total) by mouth daily. 90 tablet 3  . 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 The patient tests 4 times per day. 1 Device 0  . gabapentin (NEURONTIN) 300 MG capsule Take 1 capsule (300 mg total) by mouth at bedtime. IM program 1-time fill 30 capsule 0  . glucose blood (AGAMATRIX AMP TEST) test strip Use as instructed 100 each 12  . insulin aspart (NOVOLOG) 100 UNIT/ML injection Inject 10 Units into the skin 3 (three) times daily before meals. 10 mL 5  . insulin glargine (LANTUS) 100 UNIT/ML injection Inject 0.33 mLs (33 Units total) into the skin at bedtime. 10 mL 3  . lisinopril-hydrochlorothiazide (PRINZIDE,ZESTORETIC) 20-12.5 MG tablet Take 2 tablets by mouth daily. 180 tablet 1  . Meloxicam 10 MG CAPS Take 15 mg by mouth every morning. 14 capsule 0  . metoprolol succinate (TOPROL XL) 100 MG 24 hr tablet Take 1 tablet (100 mg total) by mouth daily. Take with or immediately following a meal. 90 tablet 0  . nitroGLYCERIN (NITROSTAT) 0.4 MG SL tablet Place 1 tablet (0.4 mg total) under the tongue every 5 (five) minutes as needed for chest pain. 100 tablet 0  . Insulin Pen Needle 32G X 4 MM MISC Use to inject insulin twice daily 100 each 5    ROS: History obtained from the patient  General ROS:  negative for - chills, fatigue, fever, night sweats, weight gain or weight loss Psychological ROS: negative for - behavioral disorder, hallucinations, memory difficulties, mood swings or suicidal ideation Ophthalmic ROS: negative for - blurry vision, double vision, eye pain or loss of vision ENT ROS: negative for - epistaxis, nasal discharge, oral lesions, sore throat, tinnitus or vertigo Allergy and Immunology ROS: negative for - hives or itchy/watery eyes Hematological and Lymphatic ROS: negative for - bleeding problems, bruising or swollen lymph nodes Endocrine ROS: negative for - galactorrhea, hair pattern changes, polydipsia/polyuria or temperature intolerance Respiratory ROS: negative for - cough, hemoptysis, shortness of breath or wheezing Cardiovascular ROS: negative for - chest pain, dyspnea on exertion, edema or irregular heartbeat Gastrointestinal ROS: negative for -  abdominal pain, diarrhea, hematemesis, nausea/vomiting or stool incontinence Genito-Urinary ROS: negative for - dysuria, hematuria, incontinence or urinary frequency/urgency Musculoskeletal ROS: negative for - joint swelling or muscular weakness Neurological ROS: as noted in HPI Dermatological ROS: negative for rash and skin lesion changes  Physical Examination: Blood pressure (!) 154/69, pulse 74, temperature 98.6 F (37 C), temperature source Oral, resp. rate 18, height '5\' 4"'$  (1.626 m), weight 76.2 kg (167 lb 15.9 oz), SpO2 100 %.  HEENT-  Normocephalic, no lesions, without obvious abnormality.  Normal external eye and conjunctiva.  Normal TM's bilaterally.  Normal auditory canals and external ears. Normal external nose, mucus membranes and septum.  Normal pharynx. Neck supple with no masses, nodes, nodules or enlargement. Cardiovascular - regular rate and rhythm, S1, S2 normal, no murmur, click, rub or gallop Lungs - chest clear, no wheezing, rales, normal symmetric air entry Abdomen - soft, non-tender; bowel sounds  normal; no masses,  no organomegaly Extremities - no joint deformities, effusion, or inflammation  Neurologic Examination: Mental Status: Alert, oriented, thought content appropriate.  Speech fluent without evidence of aphasia. Able to follow commands without difficulty. Cranial Nerves: II-Visual fields were normal. III/IV/VI-Pupils were equal and reacted normally to light. Extraocular movements were full and conjugate.    V/VII-no facial numbness and no facial weakness. VIII-normal. X-normal speech and symmetrical palatal movement. XI: trapezius strength/neck flexion strength normal bilaterally XII-midline tongue extension with normal strength. Motor: Strength of lower extremities proximally was difficult to assess because of pain with movement. Distal strength of right lower extremity was normal and there was equivocal weakness of dorsiflexion on the left. Less than full effort with muscle testing was also suspected. Strength of upper extremities was normal. Sensory: Normal sensation to light touch in lower extremities; absent vibratory sensation below the knees. Deep Tendon Reflexes: 1+ and symmetric in upper extremities and absent at knees and ankles. Plantars: Mute bilaterally Cerebellar: Normal finger-to-nose testing.   Results for orders placed or performed during the hospital encounter of 11/15/16 (from the past 48 hour(s))  Glucose, capillary     Status: Abnormal   Collection Time: 11/17/16  4:25 AM  Result Value Ref Range   Glucose-Capillary 243 (H) 65 - 99 mg/dL  Glucose, capillary     Status: Abnormal   Collection Time: 11/17/16  7:30 AM  Result Value Ref Range   Glucose-Capillary 206 (H) 65 - 99 mg/dL  Glucose, capillary     Status: Abnormal   Collection Time: 11/17/16 12:09 PM  Result Value Ref Range   Glucose-Capillary 203 (H) 65 - 99 mg/dL  RPR     Status: None   Collection Time: 11/17/16  2:48 PM  Result Value Ref Range   RPR Ser Ql Non Reactive Non Reactive     Comment: (NOTE) Performed At: [facility] [address] [place], [place] [id number] Lindon Romp MD ID:[id number]   Glucose, capillary     Status: Abnormal   Collection Time: 11/17/16  3:41 PM  Result Value Ref Range   Glucose-Capillary 268 (H) 65 - 99 mg/dL  Vitamin B12     Status: Abnormal   Collection Time: 11/17/16  5:03 PM  Result Value Ref Range   Vitamin B-12 1,124 (H) 180 - 914 pg/mL    Comment: (NOTE) This assay is not validated for testing neonatal or myeloproliferative syndrome specimens for Vitamin B12 levels.   Glucose, capillary     Status: Abnormal   Collection Time: 11/17/16  8:06 PM  Result Value Ref  Range   Glucose-Capillary 285 (H) 65 - 99 mg/dL  Glucose, capillary     Status: Abnormal   Collection Time: 11/18/16 12:34 AM  Result Value Ref Range   Glucose-Capillary 244 (H) 65 - 99 mg/dL  Basic metabolic panel     Status: Abnormal   Collection Time: 11/18/16  4:08 AM  Result Value Ref Range   Sodium 136 135 - 145 mmol/L   Potassium 4.0 3.5 - 5.1 mmol/L   Chloride 110 101 - 111 mmol/L   CO2 22 22 - 32 mmol/L   Glucose, Bld 156 (H) 65 - 99 mg/dL   BUN 19 6 - 20 mg/dL   Creatinine, Ser 1.21 (H) 0.44 - 1.00 mg/dL   Calcium 8.1 (L) 8.9 - 10.3 mg/dL   GFR calc non Af Amer 52 (L) >60 mL/min   GFR calc Af Amer 60 (L) >60 mL/min    Comment: (NOTE) The eGFR has been calculated using the CKD EPI equation. This calculation has not been validated in all clinical situations. eGFR's persistently <60 mL/min signify possible Chronic Kidney Disease.    Anion gap 4 (L) 5 - 15  CK     Status: None   Collection Time: 11/18/16  4:08 AM  Result Value Ref Range   Total CK 52 38 - 234 U/L  Glucose, capillary     Status: Abnormal   Collection Time: 11/18/16  4:38 AM  Result Value Ref Range   Glucose-Capillary 164 (H) 65 - 99 mg/dL  Glucose, capillary     Status: None   Collection Time: 11/18/16  7:39 AM  Result Value Ref Range   Glucose-Capillary 90 65 -  99 mg/dL  Sedimentation rate     Status: None   Collection Time: 11/18/16  8:28 AM  Result Value Ref Range   Sed Rate 7 0 - 22 mm/hr  Glucose, capillary     Status: Abnormal   Collection Time: 11/18/16 12:04 PM  Result Value Ref Range   Glucose-Capillary 209 (H) 65 - 99 mg/dL  Glucose, capillary     Status: Abnormal   Collection Time: 11/18/16  4:47 PM  Result Value Ref Range   Glucose-Capillary 403 (H) 65 - 99 mg/dL  Glucose, capillary     Status: Abnormal   Collection Time: 11/18/16 11:44 PM  Result Value Ref Range   Glucose-Capillary 207 (H) 65 - 99 mg/dL   Mr Thoracic Spine Wo Contrast  Result Date: 11/17/2016 CLINICAL DATA:  Two weeks of progressive lower extremity weakness. Admitted to hospital for diabetic ketoacidosis. EXAM: MRI THORACIC AND LUMBAR SPINE WITHOUT CONTRAST TECHNIQUE: Multiplanar and multiecho pulse sequences of the thoracic and lumbar spine were obtained without intravenous contrast. COMPARISON:  Limited MRI of the lumbar spine November 15, 2016 and CT abdomen and pelvis November 15, 2016 FINDINGS: MRI THORACIC SPINE FINDINGS ALIGNMENT: Maintenance of the thoracic kyphosis. No malalignment. VERTEBRAE/DISCS: Vertebral bodies are intact. Intervertebral discs morphology and signal are normal. CORD: Patchy T2 bright signal within the cervical spinal cord at T3 and T10 measuring to 17 mm in cranial caudad dimension. Poor characterization on axial sequences. PREVERTEBRAL AND PARASPINAL SOFT TISSUES: Small pleural effusions. Patulous esophagus. 2 cm T2 bright cyst in RIGHT lobe of the liver. DISC LEVELS (due to large field-of-view on axial sequences, poor signal to noise ratio of spinal cord): No significant disc bulge, canal stenosis or neural foraminal narrowing at any level. MRI LUMBAR SPINE FINDINGS SEGMENTATION: For the purposes of this report, the last well-formed intervertebral  disc will be reported as L5-S1. ALIGNMENT: Maintained lumbar lordosis. No malalignment.  VERTEBRAE:Vertebral bodies are intact. Intervertebral discs demonstrate normal morphology and signal characteristics. No abnormal bone marrow signal. CONUS MEDULLARIS: Conus medullaris terminates at L1-2 and demonstrates normal morphology and signal characteristics. Cauda equina is normal. PARASPINAL AND SOFT TISSUES: Increased interstitial STIR signal paraspinal muscles without focal fluid collection. No definite involvement of the prevertebral muscles. DISC LEVELS: L1-2 thru L3-4: No disc bulge, canal stenosis nor neural foraminal narrowing. L4-5: Small central disc protrusion. New mild facet arthropathy and ligamentum flavum redundancy without canal stenosis or neural foraminal narrowing. L5-S1: No disc bulge, canal stenosis nor neural foraminal narrowing. Minimal facet arthropathy. IMPRESSION: MR THORACIC SPINE IMPRESSION 1. Faint abnormal signal within the thoracic spinal cord, limited assessment on axial sequences. Differential diagnosis includes demyelination, focally dilated central canal or, infection. Recommend postcontrast imaging of the thoracic spinal canal using smaller field-of-view and, repeated axial T2. 2. No canal stenosis or neural foraminal narrowing. MR LUMBAR SPINE IMPRESSION 1. Re- demonstration of paraspinal muscle edema seen with myositis/rhabdomyolysis (possibly secondary to DKA), or strain. No focal fluid collection. 2. Mild degenerative change of the lumbar spine without canal stenosis or neural foraminal narrowing. Electronically Signed   By: Elon Alas M.D.   On: 11/17/2016 22:36   Mr Lumbar Spine Wo Contrast  Result Date: 11/17/2016 CLINICAL DATA:  Two weeks of progressive lower extremity weakness. Admitted to hospital for diabetic ketoacidosis. EXAM: MRI THORACIC AND LUMBAR SPINE WITHOUT CONTRAST TECHNIQUE: Multiplanar and multiecho pulse sequences of the thoracic and lumbar spine were obtained without intravenous contrast. COMPARISON:  Limited MRI of the lumbar spine  November 15, 2016 and CT abdomen and pelvis November 15, 2016 FINDINGS: MRI THORACIC SPINE FINDINGS ALIGNMENT: Maintenance of the thoracic kyphosis. No malalignment. VERTEBRAE/DISCS: Vertebral bodies are intact. Intervertebral discs morphology and signal are normal. CORD: Patchy T2 bright signal within the cervical spinal cord at T3 and T10 measuring to 17 mm in cranial caudad dimension. Poor characterization on axial sequences. PREVERTEBRAL AND PARASPINAL SOFT TISSUES: Small pleural effusions. Patulous esophagus. 2 cm T2 bright cyst in RIGHT lobe of the liver. DISC LEVELS (due to large field-of-view on axial sequences, poor signal to noise ratio of spinal cord): No significant disc bulge, canal stenosis or neural foraminal narrowing at any level. MRI LUMBAR SPINE FINDINGS SEGMENTATION: For the purposes of this report, the last well-formed intervertebral disc will be reported as L5-S1. ALIGNMENT: Maintained lumbar lordosis. No malalignment. VERTEBRAE:Vertebral bodies are intact. Intervertebral discs demonstrate normal morphology and signal characteristics. No abnormal bone marrow signal. CONUS MEDULLARIS: Conus medullaris terminates at L1-2 and demonstrates normal morphology and signal characteristics. Cauda equina is normal. PARASPINAL AND SOFT TISSUES: Increased interstitial STIR signal paraspinal muscles without focal fluid collection. No definite involvement of the prevertebral muscles. DISC LEVELS: L1-2 thru L3-4: No disc bulge, canal stenosis nor neural foraminal narrowing. L4-5: Small central disc protrusion. New mild facet arthropathy and ligamentum flavum redundancy without canal stenosis or neural foraminal narrowing. L5-S1: No disc bulge, canal stenosis nor neural foraminal narrowing. Minimal facet arthropathy. IMPRESSION: MR THORACIC SPINE IMPRESSION 1. Faint abnormal signal within the thoracic spinal cord, limited assessment on axial sequences. Differential diagnosis includes demyelination, focally  dilated central canal or, infection. Recommend postcontrast imaging of the thoracic spinal canal using smaller field-of-view and, repeated axial T2. 2. No canal stenosis or neural foraminal narrowing. MR LUMBAR SPINE IMPRESSION 1. Re- demonstration of paraspinal muscle edema seen with myositis/rhabdomyolysis (possibly secondary to DKA), or strain. No  focal fluid collection. 2. Mild degenerative change of the lumbar spine without canal stenosis or neural foraminal narrowing. Electronically Signed   By: Elon Alas M.D.   On: 11/17/2016 22:36   Mr Thoracic Spine W Wo Contrast  Result Date: 11/18/2016 CLINICAL DATA:  Midthoracic pain. Abnormal spinal cord. Diabetic ketoacidosis with lower extremity weakness EXAM: MRI THORACIC WITHOUT AND WITH CONTRAST TECHNIQUE: Multiplanar and multiecho pulse sequences of the thoracic spine were obtained without and with intravenous contrast. CONTRAST:  <See Chart> 15 mL MULTIHANCE GADOBENATE DIMEGLUMINE 529 MG/ML IV SOLN COMPARISON:  Unenhanced thoracic MRI 11/17/2016 FINDINGS: MRI THORACIC SPINE FINDINGS Alignment:  Normal Vertebrae: Negative for fracture or mass. Normal enhancement of the vertebra. Cord: Small area of hyperintensity in the cord at the T10 level is unchanged from the prior study. Again this is suboptimally evaluated on axial images but appears to be to the right of midline measuring approximately 3 mm in diameter and 13 mm cranial caudal dimension. This area does not enhance. It is difficult determine if this is a focal area of syrinx or a cord lesion. No mass-effect or enlargement of the cord Paraspinal and other soft tissues: Small bilateral pleural effusions. T2 bright with liver lesion unchanged. Disc levels: Negative for disc degeneration. IMPRESSION: Ill-defined lesion in the spinal cord at the T10 level unchanged from the prior study. This measures approximately 3 x 13 mm and does not enhance. This could represent a small syrinx however I think it  more likely is represents cord lesion. This may represent an area of transverse myelitis or chronic myelomalacia. Remainder of the cord normal. Remainder of the spine normal. Electronically Signed   By: Franchot Gallo M.D.   On: 11/18/2016 18:38    Assessment/Plan 51 year old lady presenting with back pain and lower extremity weakness with intrinsic spinal cord abnormality at T10 level, the etiology of which is unclear. Lumbar spine MRI showed paraspinal muscle edema. Serum CK was normal. Significance with respect to recent fall and trauma is also unclear. Lesion was nonenhancing and there was no mass effect.  Recommendations: 1. No further neurodiagnostic studies are indicated at this point. Patient has no signs of cord inflammation or swelling. Steroid treatment is not indicated. Consideration for surgical intervention is also not indicated. 2. Continue physical therapy intervention. Patient may need inpatient rehabilitation for continued therapy following acute hospitalization.  C.R. Nicole Kindred, MD [facility] [phone]  11/19/2016, 3:15 AM

## 2016-11-19 NOTE — Evaluation (Signed)
Occupational Therapy Evaluation Patient Details Name: Debra [name] MRN: [id number] DOB: 04/10/1965 Today's Date: 11/19/2016    History of Present Illness Pt is a 51 y/o female presenting with generalized weakness with an initial glucose of 998, admitted for DKA. PMH including but not limited to DM type 1, HLD, HTN and CVA in 1990.   Clinical Impression   PTA, pt was living her spouse and was independent with ADLs, IADLs, driving, and working as a Engineer, water. Currently, requiring Min A for UB ADLs and Max A for LB ADLs. Pt performed sit<>stand with Mod A and RW. Pt willing and motivated to participate in therapy. Pt would benefit from further acute OT to facilitate safe dc and increase occupational performance and participation. Recommend dc to CIR to optimize pt independence and safety with ADLs and functional mobility.       Follow Up Recommendations  Supervision/Assistance - 24 hour;CIR    Equipment Recommendations  Other (comment) (Defer to next venue)    Recommendations for Other Services PT consult;Rehab consult     Precautions / Restrictions Precautions Precautions: Fall Restrictions Weight Bearing Restrictions: No      Mobility Bed Mobility Overal bed mobility: Needs Assistance Bed Mobility: Rolling;Sidelying to Sit;Sit to Sidelying Rolling: Min guard Sidelying to sit: Min guard     Sit to sidelying: Min assist General bed mobility comments: increased time and effort. Min A to lift RLE back in bed  Transfers Overall transfer level: Needs assistance Equipment used: Rolling walker (2 wheeled) Transfers: Sit to/from Stand Sit to Stand: Mod assist;From elevated surface         General transfer comment: Pt able to power into standing with Mod A and RW from elevated surface. Pt maintained standing ~4-5 min    Balance Overall balance assessment: Needs assistance Sitting-balance support: Feet supported;No upper extremity supported Sitting balance-Leahy Scale:  Fair Sitting balance - Comments: single LOB during sitting Postural control: Posterior lean Standing balance support: Bilateral upper extremity supported;During functional activity Standing balance-Leahy Scale: Poor Standing balance comment: Reliant on BUE on RW                           ADL either performed or assessed with clinical judgement   ADL Overall ADL's : Needs assistance/impaired Eating/Feeding: Set up;Bed level   Grooming: Min guard;Sitting Grooming Details (indicate cue type and reason): Sitting EOB pt required Min Guard A due to poor balance and feeling dizzy. Pt requiring Mod-Max A to correct balance during single LOB while at EOB (posterior lean). Upper Body Bathing: Minimal assistance;Sitting   Lower Body Bathing: Moderate assistance;Sit to/from stand Lower Body Bathing Details (indicate cue type and reason): Pt cleaned up after BM in bed with Mod A. Pt performing sit<>Stand with Mod A and then maintain standing with Min A and RW. Pt abel to reach back to perform toilet hygiene. However, requires assistance to make sure she was clean.  Upper Body Dressing : Minimal assistance;Sitting   Lower Body Dressing: Maximal assistance;Sit to/from stand Lower Body Dressing Details (indicate cue type and reason): Pt attempt to adjust socks but became dizzy.             Functional mobility during ADLs: Moderate assistance;Rolling walker;Cueing for sequencing;Cueing for safety (sit<>Stand only) General ADL Comments: Pt demonstrating decreased functional performance and requiring Min A for UB ADLs and Mod-max for LB ADLs. Pt limited by pain, fatigue, and dizziness.      Vision Baseline Vision/History:  Wears glasses Wears Glasses: At all times Patient Visual Report: Other (comment) (Pt denies any changes in vision)       Perception     Praxis      Pertinent Vitals/Pain Pain Assessment: 0-10 Pain Score: 8  Pain Location: bilateral LEs (R>L) Pain Descriptors /  Indicators: Sore;Grimacing;Guarding Pain Intervention(s): Monitored during session;Limited activity within patient's tolerance;Repositioned     Hand Dominance Right   Extremity/Trunk Assessment Upper Extremity Assessment Upper Extremity Assessment: Generalized weakness   Lower Extremity Assessment Lower Extremity Assessment: Defer to PT evaluation   Cervical / Trunk Assessment Cervical / Trunk Assessment: Normal   Communication Communication Communication: No difficulties   Cognition Arousal/Alertness: Awake/alert Behavior During Therapy: Flat affect Overall Cognitive Status: Within Functional Limits for tasks assessed                                     General Comments  Pt daughter in the room    Exercises Exercises: General Upper Extremity General Exercises - Upper Extremity Shoulder ABduction: AROM;Both;5 reps;Seated Shoulder ADduction: AROM;Both;5 reps;Seated   Shoulder Instructions      Home Living Family/patient expects to be discharged to:: Private residence Living Arrangements: Spouse/significant other Available Help at Discharge: Family;Friend(s);Available PRN/intermittently Type of Home: House Home Access: Stairs to enter Entrance Stairs-Number of Steps: 1   Home Layout: One level     Bathroom Shower/Tub: Tub/shower unit;Door   Bathroom Toilet: Handicapped height     Home Equipment: Environmental consultant - 2 wheels;Wheelchair - manual;Cane - single point          Prior Functioning/Environment Level of Independence: Independent        Comments: prior to her fall ~3 weeks ago pt was independent (including working); since then she has had great difficulty with ambulating inside her home and performing ADLs.        OT Problem List: Decreased strength;Decreased range of motion;Decreased activity tolerance;Impaired balance (sitting and/or standing);Decreased safety awareness;Decreased knowledge of use of DME or AE;Decreased knowledge of  precautions;Pain      OT Treatment/Interventions: Self-care/ADL training;Therapeutic exercise;Energy conservation;DME and/or AE instruction;Therapeutic activities;Patient/family education    OT Goals(Current goals can be found in the care plan section) Acute Rehab OT Goals Patient Stated Goal: return to PLOF OT Goal Formulation: With patient Time For Goal Achievement: 12/03/16 Potential to Achieve Goals: Good ADL Goals Pt Will Perform Grooming: with min assist;standing Pt Will Perform Upper Body Dressing: with set-up;with supervision;sitting Pt Will Perform Lower Body Dressing: with min assist;with adaptive equipment;sit to/from stand Pt Will Transfer to Toilet: with min assist;ambulating;bedside commode Pt Will Perform Toileting - Clothing Manipulation and hygiene: sit to/from stand;with min guard assist  OT Frequency: Min 2X/week   Barriers to D/C:            Co-evaluation              AM-PAC PT "6 Clicks" Daily Activity     Outcome Measure Help from another person eating meals?: None Help from another person taking care of personal grooming?: A Little Help from another person toileting, which includes using toliet, bedpan, or urinal?: A Lot Help from another person bathing (including washing, rinsing, drying)?: A Lot Help from another person to put on and taking off regular upper body clothing?: A Little Help from another person to put on and taking off regular lower body clothing?: A Lot 6 Click Score: 16   End of Session  Equipment Utilized During Treatment: Careers adviser Communication: Mobility status;Other (comment) (Ready for bath)  Activity Tolerance: Patient tolerated treatment well;Patient limited by fatigue;Patient limited by pain Patient left: in bed;with call bell/phone within reach;with bed alarm set;with family/visitor present  OT Visit Diagnosis: Unsteadiness on feet (R26.81);Other abnormalities of gait and mobility (R26.89);Muscle  weakness (generalized) (M62.81);Pain Pain - Right/Left:  (Bilateral) Pain - part of body: Leg (Back)                Time: 1610-9604 OT Time Calculation (min): 22 min Charges:  OT General Charges $OT Visit: 1 Visit OT Evaluation $OT Eval Moderate Complexity: 1 Mod G-Codes:     Javarious Elsayed MSOT, OTR/L Acute Rehab Pager: [phone] Office: [phone]  Theodoro Grist Ashayla Subia 11/19/2016, 1:28 PM

## 2016-11-20 DIAGNOSIS — IMO0002 Reserved for concepts with insufficient information to code with codable children: Secondary | ICD-10-CM

## 2016-11-20 DIAGNOSIS — Z8673 Personal history of transient ischemic attack (TIA), and cerebral infarction without residual deficits: Secondary | ICD-10-CM

## 2016-11-20 DIAGNOSIS — N179 Acute kidney failure, unspecified: Secondary | ICD-10-CM

## 2016-11-20 DIAGNOSIS — M79604 Pain in right leg: Secondary | ICD-10-CM

## 2016-11-20 DIAGNOSIS — M797 Fibromyalgia: Secondary | ICD-10-CM

## 2016-11-20 DIAGNOSIS — E1065 Type 1 diabetes mellitus with hyperglycemia: Secondary | ICD-10-CM

## 2016-11-20 DIAGNOSIS — M79605 Pain in left leg: Secondary | ICD-10-CM

## 2016-11-20 DIAGNOSIS — I160 Hypertensive urgency: Secondary | ICD-10-CM

## 2016-11-20 DIAGNOSIS — R339 Retention of urine, unspecified: Secondary | ICD-10-CM

## 2016-11-20 DIAGNOSIS — E1010 Type 1 diabetes mellitus with ketoacidosis without coma: Secondary | ICD-10-CM

## 2016-11-20 DIAGNOSIS — R29898 Other symptoms and signs involving the musculoskeletal system: Secondary | ICD-10-CM

## 2016-11-20 DIAGNOSIS — I10 Essential (primary) hypertension: Secondary | ICD-10-CM

## 2016-11-20 DIAGNOSIS — E1042 Type 1 diabetes mellitus with diabetic polyneuropathy: Secondary | ICD-10-CM

## 2016-11-20 LAB — GLUCOSE, CAPILLARY
GLUCOSE-CAPILLARY: 181 mg/dL — AB (ref 65–99)
GLUCOSE-CAPILLARY: 329 mg/dL — AB (ref 65–99)
Glucose-Capillary: 164 mg/dL — ABNORMAL HIGH (ref 65–99)
Glucose-Capillary: 88 mg/dL (ref 65–99)
Glucose-Capillary: 350 mg/dL — ABNORMAL HIGH (ref 65–99)
Glucose-Capillary: 251 mg/dL — ABNORMAL HIGH (ref 65–99)
Glucose-Capillary: 314 mg/dL — ABNORMAL HIGH (ref 65–99)

## 2016-11-20 LAB — BASIC METABOLIC PANEL
Anion gap: 10 (ref 5–15)
BUN: 15 mg/dL (ref 6–20)
CO2: 21 mmol/L — ABNORMAL LOW (ref 22–32)
Calcium: 8.4 mg/dL — ABNORMAL LOW (ref 8.9–10.3)
Chloride: 104 mmol/L (ref 101–111)
Creatinine, Ser: 0.97 mg/dL (ref 0.44–1.00)
Glucose, Bld: 127 mg/dL — ABNORMAL HIGH (ref 65–99)
POTASSIUM: 4.6 mmol/L (ref 3.5–5.1)
Sodium: 135 mmol/L (ref 135–145)

## 2016-11-20 MED ORDER — INSULIN ASPART 100 UNIT/ML ~~LOC~~ SOLN
6.0000 [IU] | Freq: Three times a day (TID) | SUBCUTANEOUS | Status: DC
  Administered 2016-11-20 – 2016-11-21 (×4): 6 [IU] via SUBCUTANEOUS

## 2016-11-20 MED ORDER — GABAPENTIN 300 MG PO CAPS
600.0000 mg | ORAL_CAPSULE | Freq: Three times a day (TID) | ORAL | Status: DC
  Administered 2016-11-20 – 2016-11-22 (×7): 600 mg via ORAL
  Filled 2016-11-20 (×7): qty 2

## 2016-11-20 MED ORDER — LISINOPRIL 40 MG PO TABS
40.0000 mg | ORAL_TABLET | Freq: Every day | ORAL | Status: DC
  Administered 2016-11-20 – 2016-11-22 (×3): 40 mg via ORAL
  Filled 2016-11-20 (×3): qty 1

## 2016-11-20 NOTE — Progress Notes (Signed)
   Subjective:  No acute events overnight. Patient in better spirits this morning. She was sitting up in chair eating breakfast. States she was able to ambulate in the hallway with the help of a walker. Continues to have lower extremity pain but improving with Toradol. Appetite continues to improve.    Objective:  Vital signs in last 24 hours: Vitals:   11/19/16 1036 11/19/16 1500 11/19/16 2034 11/20/16 0541  BP: (!) 178/88 (!) 166/79 (!) 179/82 (!) 164/84  Pulse:  73 77 69  Resp:  '18 17 15  '$ Temp: 99 F (37.2 C) 98.1 F (36.7 C) 97.9 F (36.6 C) 97.7 F (36.5 C)  TempSrc:  Oral Oral Oral  SpO2: 100% 100% 100% 100%  Weight:      Height:       General: pleasant female, thin, well-developed, sitting up in chair eating breakfast in no acute distress  Cardiac: regular rate and rhythm, nl S1/S2, no murmurs, rubs or gallops  Neuro: A&Ox3, passive and active ROM much improved, patient is now able to flex knees, 5/5 on dorsi and plantarflexion  Ext: warm and well perfused, no peripheral edema, 2+ DP pulses bilaterally   Assessment/Plan:  Debra [name] is a 51 y.o. female with a history for [id number] complicated by peripheral  Neuropathy who presented to the ED with multiple complaints including AMS, abdominal pain, N/V, diarrhea, and 2 week history of progressive weakness and pain of her lower extremities.   Bilateral hip pain and weakness:  Unclear etiology at this time. Cauda equina ruled out. MRI thoracic with contrast showed a non-enhancing lesion at T10. ESR and CK were normal. Normal b/l hip XRs. PT recommended SNF and OT CIR. Unfortunately, patient does not have insurance and does not qualify for either. Will consult CIR, patient might be a candidate for their charity program. Neurology was consulted and recommended no further intervention at this point as the lesion did not enhance and there was no mass effect. Per neurology, lesion is chronic in nature given non-enhancement on MRI and  not thought to be related to her acute complaints. LE pain likely secondary to paraspinal inflammation, per neuro. Unlikely MS given patients age.  - F/u CIR recommendations  - Continue IV toradol 30 mg q6 hours - Increase gabapentin 600 bID --> 600 mg TID  - SW consult  # Urinary retention: Patient states she has been experiencing urinary retention 2 weeks prior to admission. Patient had minimal UOP during admission. Bladder scan with >999cc and foley catheter placed. UOP adequate.  - Will do trial of void   T1DM, uncontrolled: Presented with DKA, now resolved. Patient reports that her diet and appetite are improving.  - SQ Lantus 25U QHS + Novolog 4-->6U TID with meals  - SSI-S   # HTN: Remains hypertensive with sBP 160s-170s - Continue home amlodipine 10 mg QD  - Continue metoprolol to 50 mg daily. Will plan to resume home dose on discharge.  - Resume lisinopril 40 mg QD    F: None  E:  Will continue to monitor and replete as needed  N: CM + supplements   VTE ppx: SQ heparin   Code status: Full code, not confirmed   Dispo: Anticipated discharge in approximately 2 day(s) pending SNF placement  Welford Roche, MD  Internal Medicine PGY-1  P [phone]

## 2016-11-20 NOTE — Progress Notes (Signed)
Inpatient Rehabilitation  Met with patient to discuss team's recommendation for IP Rehab. Shared booklets, insurance verification letter, and answered questions.  Plan to follow for timing of medical readiness, therapy tolerance, and IP Rehab bed availability.  Please call with questions.   Carmelia Roller., CCC/SLP Admission Coordinator  [facility]  Cell [phone]

## 2016-11-20 NOTE — Progress Notes (Signed)
Rehab Admissions Coordinator Note:  Patient was screened by Trish Mage for appropriateness for an Inpatient Acute Rehab Consult.  At this time, we are recommending Inpatient Rehab consult.  Trish Mage 11/20/2016, 8:26 AM  I can be reached at [phone].

## 2016-11-20 NOTE — Consult Note (Signed)
Physical Medicine and Rehabilitation Consult Reason for Consult: Decreased functional mobility Referring Physician: Internal medicine   HPI: Debra [name] is a 51 y.o. right handed female with history of CVA 1990, type 1 diabetes mellitus with neuropathy, DKA, fibromyalgia. Per chart review patient lives with her boyfriend. Boyfriend works during the day and no other local family can assist. One level home with 1 step to entry. She was independent until approximately 3 weeks ago. Presented 11/15/2016 with progressive weakness bilateral lower extremities as well as back pain over the past 2 months and difficulty with ambulation as well as associated diarrhea. Noted sodium 127, creatinine 2.72. CT abdomen and chest x-ray unremarkable. Glucose was 998 she was started on insulin drip. MRI with thoracic lesion.  Per reports, MRI lumbar and thoracic showed no canal stenosis or neural foraminal narrowing. Thoracic MRI showed a 3 x 13 mm intramedullary nonenhancing lesion. Neurology did not recommend any further workup in regards to reported suspect lesion as there was no mass effect and cauda equina syndrome was ruled out. ESR and CK were normal. Subcutaneous heparin for DVT prophylaxis. Physical occupational therapy evaluations completed. Recommendations were later made for physical medicine rehabilitation consult.   Review of Systems  Constitutional: Negative for chills and fever.  HENT: Negative for hearing loss.   Eyes: Negative for blurred vision and double vision.  Respiratory: Negative for cough and shortness of breath.   Cardiovascular: Negative for chest pain, palpitations and leg swelling.  Gastrointestinal: Positive for constipation. Negative for nausea.  Genitourinary: Positive for urgency. Negative for dysuria, flank pain and hematuria.  Musculoskeletal: Positive for back pain and myalgias.  Skin: Negative for rash.  Neurological: Positive for sensory change, focal weakness,  weakness and headaches. Negative for seizures.  Psychiatric/Behavioral: Positive for depression.       Anxiety  All other systems reviewed and are negative.  Past Medical History:  Diagnosis Date  . Adenomatous colonic polyps   . Anemia    2005  . Anxiety    1990  . Arthritis   . Asthma    2000  . Cataract   . Depression 1990  . Difficult intubation    narrow airway  . Gastroesophageal Reflux Disease (GERD)   . Heart murmur    Birth  . Hyperlipidemia    2005  . Hypertension    1998  . Internal hemorrhoids 04/24/06   on colonoscopy  . Migraine   . Neuropathy of the hands & feet   . Restless Leg Syndrome   . Right Ankle Fracture 10/06/2013  . Sleep paralysis   . Stable angina pectoris    2007: cath showing normal cors.   . Stroke 1990  . Type I Diabetes Mellitus 1988   Past Surgical History:  Procedure Laterality Date  . bilateral foot surgery    . BREAST SURGERY Left    biopsy left breast  . CARDIAC CATHETERIZATION  2007   around 2007 or 2008  . CESAREAN SECTION    . CHOLECYSTECTOMY    . COLONOSCOPY W/ BIOPSIES AND POLYPECTOMY    . DILATION AND CURETTAGE OF UTERUS  12/29/2010   Surgeon: Osborne Oman, MD;  Location: [facility];  Service: Gynecology  . ENDOMETRIAL ABLATION W/ NOVASURE N/A 12/2009  . EYE SURGERY    . HYSTEROSCOPY  12/29/2010   Procedure: HYSTEROSCOPY WITH HYDROTHERMAL ABLATION;  Surgeon: Osborne Oman, MD;  Location: [facility];  Service: Gynecology;;  . IUD REMOVAL  12/29/2010  Procedure: INTRAUTERINE DEVICE (IUD) REMOVAL;  Surgeon: Osborne Oman, MD;  Location: [facility];  Service: Gynecology;  Laterality: N/A;  . LAPAROSCOPIC TUBAL LIGATION  12/29/2010   Procedure: LAPAROSCOPIC TUBAL LIGATION;  Surgeon: Osborne Oman, MD;  Location: [facility];  Service: Gynecology;  Laterality: Bilateral;  . ORIF ANKLE FRACTURE Right 10/09/2013   Procedure: Open reduction internal fixation right ankle;  Surgeon: Nita Sells, MD;  Location: [facility];   Service: Orthopedics;  Laterality: Right;  Open reduction internal fixation right ankle  . TRIGGER FINGER RELEASE     x 3   Family History  Problem Relation Age of Onset  . Hypertension Mother   . Kidney disease Mother   . Hypertension Father   . Breast cancer Maternal Grandmother   . Prostate cancer Maternal Grandfather   . Ovarian cancer Paternal Grandmother   . Prostate cancer Paternal Grandfather   . Colon cancer Maternal Uncle        Family history of malignant neoplasm of gastrointestinal tract   Social History:  reports that she has never smoked. She has never used smokeless tobacco. She reports that she does not drink alcohol or use drugs. Allergies:  Allergies  Allergen Reactions  . Penicillins Anaphylaxis, Nausea And Vomiting and Rash    Has patient had a PCN reaction causing immediate rash, facial/tongue/throat swelling, SOB or lightheadedness with hypotension: Yes Has patient had a PCN reaction causing severe rash involving mucus membranes or skin necrosis: Yes Has patient had a PCN reaction that required hospitalization No Has patient had a PCN reaction occurring within the last 10 years: Yes If all of the above answers are "NO", then may proceed with Cephalosporin use.   . Pollen Extract     Seasonal Allergies  . Tape Rash   Medications Prior to Admission  Medication Sig Dispense Refill  . albuterol (PROVENTIL HFA;VENTOLIN HFA) 108 (90 Base) MCG/ACT inhaler Inhale 1-2 puffs into the lungs every 6 (six) hours as needed for wheezing or shortness of breath. 3 Inhaler 0  . amLODipine (NORVASC) 10 MG tablet Take 1 tablet (10 mg total) by mouth daily. 90 tablet 1  . aspirin EC 81 MG tablet Take 1 tablet (81 mg total) by mouth daily. 90 tablet 3  . atorvastatin (LIPITOR) 40 MG tablet Take 1 tablet (40 mg total) by mouth daily. 90 tablet 3  . 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 The patient  tests 4 times per day. 1 Device 0  . gabapentin (NEURONTIN) 300 MG capsule Take 1 capsule (300 mg total) by mouth at bedtime. IM program 1-time fill 30 capsule 0  . glucose blood (AGAMATRIX AMP TEST) test strip Use as instructed 100 each 12  . insulin aspart (NOVOLOG) 100 UNIT/ML injection Inject 10 Units into the skin 3 (three) times daily before meals. 10 mL 5  . insulin glargine (LANTUS) 100 UNIT/ML injection Inject 0.33 mLs (33 Units total) into the skin at bedtime. 10 mL 3  . lisinopril-hydrochlorothiazide (PRINZIDE,ZESTORETIC) 20-12.5 MG tablet Take 2 tablets by mouth daily. 180 tablet 1  . Meloxicam 10 MG CAPS Take 15 mg by mouth every morning. 14 capsule 0  . metoprolol succinate (TOPROL XL) 100 MG 24 hr tablet Take 1 tablet (100 mg total) by mouth daily. Take with or immediately following a meal. 90 tablet 0  . nitroGLYCERIN (NITROSTAT) 0.4 MG SL tablet Place 1 tablet (0.4 mg total) under the  tongue every 5 (five) minutes as needed for chest pain. 100 tablet 0  . Insulin Pen Needle 32G X 4 MM MISC Use to inject insulin twice daily 100 each 5    Home: Home Living Family/patient expects to be discharged to:: Private residence Living Arrangements: Spouse/significant other Available Help at Discharge: Family, Friend(s), Available PRN/intermittently Type of Home: House Home Access: Stairs to enter Technical brewer of Steps: 1 Home Layout: One level Bathroom Shower/Tub: Tub/shower unit, Door Bathroom Toilet: Handicapped height Home Equipment: Environmental consultant - 2 wheels, Wheelchair - manual, [employer] - single point  Functional History: Prior Function Level of Independence: Independent Comments: prior to her fall ~3 weeks ago pt was independent (including working); since then she has had great difficulty with ambulating inside her home and performing ADLs. Functional Status:  Mobility: Bed Mobility Overal bed mobility: Needs Assistance Bed Mobility: Rolling, Sidelying to Sit, Sit to  Sidelying Rolling: Min guard Sidelying to sit: Min guard Sit to sidelying: Min assist General bed mobility comments: Pt seated in recliner on arrival.   Transfers Overall transfer level: Needs assistance Equipment used: Rolling walker (2 wheeled) Transfers: Sit to/from Stand Sit to Stand: Mod assist General transfer comment: Assist to boost into standing with cues for hand placement to and from seated surface.  Pt with improved technique from previous standing trials.  In standing patient performed weight shifting and marching in place.   Ambulation/Gait Ambulation/Gait assistance: Min assist, Mod assist Ambulation Distance (Feet): 60 Feet Assistive device: Rolling walker (2 wheeled) Gait Pattern/deviations: Step-to pattern, Decreased stride length, Trunk flexed, Decreased dorsiflexion - right, Wide base of support General Gait Details: Pt performed weight shifting and marching pre gait and tolerated well.  During gait training patient presents with forward flexion and buckling in R knee.  Pt required cues for hip extension, quad control and B heel strike.   Gait velocity interpretation: Below normal speed for age/gender    ADL: ADL Overall ADL's : Needs assistance/impaired Eating/Feeding: Set up, Bed level Grooming: Min guard, Sitting Grooming Details (indicate cue type and reason): Sitting EOB pt required Min Guard A due to poor balance and feeling dizzy. Pt requiring Mod-Max A to correct balance during single LOB while at EOB (posterior lean). Upper Body Bathing: Minimal assistance, Sitting Lower Body Bathing: Moderate assistance, Sit to/from stand Lower Body Bathing Details (indicate cue type and reason): Pt cleaned up after BM in bed with Mod A. Pt performing sit<>Stand with Mod A and then maintain standing with Min A and RW. Pt abel to reach back to perform toilet hygiene. However, requires assistance to make sure she was clean.  Upper Body Dressing : Minimal assistance,  Sitting Lower Body Dressing: Maximal assistance, Sit to/from stand Lower Body Dressing Details (indicate cue type and reason): Pt attempt to adjust socks but became dizzy. Functional mobility during ADLs: Moderate assistance, Rolling walker, Cueing for sequencing, Cueing for safety (sit<>Stand only) General ADL Comments: Pt demonstrating decreased functional performance and requiring Min A for UB ADLs and Mod-max for LB ADLs. Pt limited by pain, fatigue, and dizziness.   Cognition: Cognition Overall Cognitive Status: Within Functional Limits for tasks assessed Orientation Level: Oriented X4 Cognition Arousal/Alertness: Awake/alert Behavior During Therapy: Flat affect Overall Cognitive Status: Within Functional Limits for tasks assessed General Comments: cognition not formally assessed but WNL for general conversation  Blood pressure (!) 219/96, pulse 70, temperature 97.7 F (36.5 C), temperature source Oral, resp. rate 15, height '5\' 4"'$  (1.626 m), weight 76.2 kg (167 lb 15.9  oz), SpO2 100 %. Physical Exam  Vitals reviewed. Constitutional: She is oriented to person, place, and time. She appears well-developed and well-nourished.  HENT:  Head: Normocephalic and atraumatic.  Eyes: EOM are normal. Right eye exhibits no discharge. Left eye exhibits no discharge.  Neck: Normal range of motion. Neck supple. No thyromegaly present.  Cardiovascular: Normal rate, regular rhythm and normal heart sounds.   Respiratory: Effort normal and breath sounds normal. No respiratory distress.  GI: Soft. Bowel sounds are normal. She exhibits no distension.  Musculoskeletal: She exhibits no edema or tenderness.  Neurological: She is alert and oriented to person, place, and time.  Motor: B/l UE 5/5 proximal to distal RLE: HF, KE 2+/5, ADF/PF 3-/5 LLE: HF, KE 3+/5, ADF/PF 4-/5DTRs symmetric Sensation diminished to light touch distal knees  Skin: Skin is warm and dry.  Psychiatric: She has a normal mood and  affect. Her behavior is normal. Thought content normal.    Results for orders placed or performed during the hospital encounter of 11/15/16 (from the past 24 hour(s))  Glucose, capillary     Status: Abnormal   Collection Time: 11/19/16 12:07 PM  Result Value Ref Range   Glucose-Capillary 181 (H) 65 - 99 mg/dL  Glucose, capillary     Status: Abnormal   Collection Time: 11/19/16  5:26 PM  Result Value Ref Range   Glucose-Capillary 208 (H) 65 - 99 mg/dL  Glucose, capillary     Status: Abnormal   Collection Time: 11/19/16  8:36 PM  Result Value Ref Range   Glucose-Capillary 329 (H) 65 - 99 mg/dL  Glucose, capillary     Status: Abnormal   Collection Time: 11/20/16 12:44 AM  Result Value Ref Range   Glucose-Capillary 181 (H) 65 - 99 mg/dL  Basic metabolic panel     Status: Abnormal   Collection Time: 11/20/16  4:42 AM  Result Value Ref Range   Sodium 135 135 - 145 mmol/L   Potassium 4.6 3.5 - 5.1 mmol/L   Chloride 104 101 - 111 mmol/L   CO2 21 (L) 22 - 32 mmol/L   Glucose, Bld 127 (H) 65 - 99 mg/dL   BUN 15 6 - 20 mg/dL   Creatinine, Ser 0.97 0.44 - 1.00 mg/dL   Calcium 8.4 (L) 8.9 - 10.3 mg/dL   GFR calc non Af Amer >60 >60 mL/min   GFR calc Af Amer >60 >60 mL/min   Anion gap 10 5 - 15  Glucose, capillary     Status: Abnormal   Collection Time: 11/20/16  5:38 AM  Result Value Ref Range   Glucose-Capillary 164 (H) 65 - 99 mg/dL  Glucose, capillary     Status: None   Collection Time: 11/20/16  8:09 AM  Result Value Ref Range   Glucose-Capillary 88 65 - 99 mg/dL   Mr Thoracic Spine W Wo Contrast  Result Date: 11/18/2016 CLINICAL DATA:  Midthoracic pain. Abnormal spinal cord. Diabetic ketoacidosis with lower extremity weakness EXAM: MRI THORACIC WITHOUT AND WITH CONTRAST TECHNIQUE: Multiplanar and multiecho pulse sequences of the thoracic spine were obtained without and with intravenous contrast. CONTRAST:  <See Chart> 15 mL MULTIHANCE GADOBENATE DIMEGLUMINE 529 MG/ML IV SOLN  COMPARISON:  Unenhanced thoracic MRI 11/17/2016 FINDINGS: MRI THORACIC SPINE FINDINGS Alignment:  Normal Vertebrae: Negative for fracture or mass. Normal enhancement of the vertebra. Cord: Small area of hyperintensity in the cord at the T10 level is unchanged from the prior study. Again this is suboptimally evaluated on axial images but appears  to be to the right of midline measuring approximately 3 mm in diameter and 13 mm cranial caudal dimension. This area does not enhance. It is difficult determine if this is a focal area of syrinx or a cord lesion. No mass-effect or enlargement of the cord Paraspinal and other soft tissues: Small bilateral pleural effusions. T2 bright with liver lesion unchanged. Disc levels: Negative for disc degeneration. IMPRESSION: Ill-defined lesion in the spinal cord at the T10 level unchanged from the prior study. This measures approximately 3 x 13 mm and does not enhance. This could represent a small syrinx however I think it more likely is represents cord lesion. This may represent an area of transverse myelitis or chronic myelomalacia. Remainder of the cord normal. Remainder of the spine normal. Electronically Signed   By: Franchot Gallo M.D.   On: 11/18/2016 18:38    Assessment/Plan: Diagnosis: LE weakness Labs and images independently reviewed.  Records reviewed and summated above.  1. Does the need for close, 24 hr/day medical supervision in concert with the patient's rehab needs make it unreasonable for this patient to be served in a less intensive setting? Potentially 2. Co-Morbidities requiring supervision/potential complications: CVA 2956, uncontrolled type 1 diabetes mellitus with neuropathy (Monitor in accordance with exercise and adjust meds as necessary), DKA, fibromyalgia, HTN with HTN urgency (monitor and provide prns in accordance with increased physical exertion and pain), AKI (avoid nephrotoxic meds)  3. Due to bladder management, safety and skin/wound care,  does the patient require 24 hr/day rehab nursing? Potentially 4. Does the patient require coordinated care of a physician, rehab nurse, PT (1-2 hrs/day, 5 days/week) and OT (1-2 hrs/day, 5 days/week) to address physical and functional deficits in the context of the above medical diagnosis(es)? Yes Addressing deficits in the following areas: balance, endurance, locomotion, strength, transferring, bowel/bladder control, bathing, dressing, toileting and psychosocial support 5. Can the patient actively participate in an intensive therapy program of at least 3 hrs of therapy per day at least 5 days per week? Potentially 6. The potential for patient to make measurable gains while on inpatient rehab is excellent 7. Anticipated functional outcomes upon discharge from inpatient rehab are supervision and min assist  with PT, supervision and min assist with OT, n/a with SLP. 8. Estimated rehab length of stay to reach the above functional goals is: 17-20 days. 9. Anticipated D/C setting: Other 10. Anticipated post D/C treatments: SNF 11. Overall Rehab/Functional Prognosis: good  RECOMMENDATIONS: This patient's condition is appropriate for continued rehabilitative care in the following setting: CIR if able to tolerate therapies when medically stable and workup complete. Patient has agreed to participate in recommended program. Yes Note that insurance prior authorization may be required for reimbursement for recommended care.  Comment: Rehab Admissions Coordinator to follow up.  Delice Lesch, MD, ABPMR Cathlyn Parsons., PA-C 11/20/2016

## 2016-11-20 NOTE — Progress Notes (Addendum)
Physical Therapy Treatment Patient Details Name: Debra [name] MRN: [id number] DOB: February 27, 1966 Today's Date: 11/20/2016    History of Present Illness Pt is a 51 y/o female presenting with generalized weakness with an initial glucose of 998, admitted for DKA. PMH including but not limited to DM type 1, HLD, HTN and CVA in 1990.    PT Comments    Pt progressing well during session and able to tolerate short bout of gait training.  Pt with buckling in R LE in stance phase and required mod assist to correct.  Pt continues to benefit from skilled therapy in post acute setting to return function to patient.  Pt pleased with her progress and more confident during today's session.      Follow Up Recommendations  CIR;Supervision/Assistance - 24 hour     Equipment Recommendations  None recommended by PT;Other (comment) (defer to next venue)    Recommendations for Other Services       Precautions / Restrictions Precautions Precautions: Fall Restrictions Weight Bearing Restrictions: Yes    Mobility  Bed Mobility               General bed mobility comments: Pt seated in recliner on arrival.    Transfers Overall transfer level: Needs assistance Equipment used: Rolling walker (2 wheeled) Transfers: Sit to/from Stand Sit to Stand: Mod assist         General transfer comment: Assist to boost into standing with cues for hand placement to and from seated surface.  Pt with improved technique from previous standing trials.  In standing patient performed weight shifting and marching in place.    Ambulation/Gait Ambulation/Gait assistance: Min assist;Mod assist Ambulation Distance (Feet): 60 Feet Assistive device: Rolling walker (2 wheeled) Gait Pattern/deviations: Step-to pattern;Decreased stride length;Trunk flexed;Decreased dorsiflexion - right;Wide base of support   Gait velocity interpretation: Below normal speed for age/gender General Gait Details: Pt performed weight  shifting and marching pre gait and tolerated well.  During gait training patient presents with forward flexion and buckling in R knee.  Pt required cues for hip extension, quad control and B heel strike.     Stairs            Wheelchair Mobility    Modified Rankin (Stroke Patients Only)       Balance Overall balance assessment: Needs assistance   Sitting balance-Leahy Scale: Fair       Standing balance-Leahy Scale: Poor Standing balance comment: Reliant on BUE on RW                            Cognition Arousal/Alertness: Awake/alert Behavior During Therapy: Flat affect Overall Cognitive Status: Within Functional Limits for tasks assessed                                 General Comments: cognition not formally assessed but WNL for general conversation      Exercises General Exercises - Lower Extremity Quad Sets: AROM;Both;10 reps;Supine Hip Flexion/Marching: AROM;Both;10 reps;Standing Mini-Sqauts: AROM;Both;10 reps (via chair pushups.  ) Other Exercises Other Exercises: R quad stretch 2x10 sec, 1x30 sec.      General Comments        Pertinent Vitals/Pain Pain Assessment: 0-10 Pain Score: 5  Pain Location: R quad Pain Descriptors / Indicators: Sore;Grimacing;Guarding Pain Intervention(s): Monitored during session;Repositioned    Home Living  Prior Function            PT Goals (current goals can now be found in the care plan section) Acute Rehab PT Goals Patient Stated Goal: return to PLOF Potential to Achieve Goals: Fair Progress towards PT goals: Progressing toward goals    Frequency    Min 3X/week      PT Plan Current plan remains appropriate    Co-evaluation              AM-PAC PT "6 Clicks" Daily Activity  Outcome Measure  Difficulty turning over in bed (including adjusting bedclothes, sheets and blankets)?: A Lot Difficulty moving from lying on back to sitting on the side  of the bed? : A Lot Difficulty sitting down on and standing up from a chair with arms (e.g., wheelchair, bedside commode, etc,.)?: Unable Help needed moving to and from a bed to chair (including a wheelchair)?: A Lot Help needed walking in hospital room?: A Lot Help needed climbing 3-5 steps with a railing? : Total 6 Click Score: 10    End of Session Equipment Utilized During Treatment: Gait belt Activity Tolerance: Patient limited by fatigue;Patient limited by pain Patient left: in bed;with call bell/phone within reach;with bed alarm set Nurse Communication: Mobility status PT Visit Diagnosis: Other abnormalities of gait and mobility (R26.89);Pain Pain - Right/Left:  (bilateral) Pain - part of body: Leg     Time: 0932-3557 PT Time Calculation (min) (ACUTE ONLY): 15 min  Charges:  $Gait Training: 8-22 mins                    G Codes:       Joycelyn Rua, PTA pager [phone]    Florestine Avers 11/20/2016, 10:35 AM

## 2016-11-20 NOTE — Progress Notes (Signed)
Inpatient Diabetes Program Recommendations  AACE/ADA: New Consensus Statement on Inpatient Glycemic Control (2015)  Target Ranges:  Prepandial:   less than 140 mg/dL      Peak postprandial:   less than 180 mg/dL (1-2 hours)      Critically ill patients:  140 - 180 mg/dL   Lab Results  Component Value Date   GLUCAP 88 11/20/2016   HGBA1C >14.0 10/31/2016    Review of Glycemic ControlResults for Debra [name], Debra [name] (MRN [id number]) as of 11/20/2016 11:08  Ref. Range 11/19/2016 07:57 11/19/2016 12:07 11/19/2016 17:26 11/19/2016 20:36 11/20/2016 00:44 11/20/2016 05:38 11/20/2016 08:09  Glucose-Capillary Latest Ref Range: 65 - 99 mg/dL 84 360 (H) 677 (H) 034 (H) 181 (H) 164 (H) 88   Diabetes history: DM1 (makes NO insulin; requires basal, correction, and meal coverage insulin) Outpatient Diabetes medications: Lantus 33 units daily, Novolog 10 units TID with meals Current orders for Inpatient glycemic control:  Lantus 25 units daily, Novolog 4 units tid with meals, Novolog sensitive q 4 hours  Inpatient Diabetes Program Recommendations:  Please consider increasing Novolog meal coverage to 6 units tid with meals.  Also consider changing Novolog correction to tid with meals and HS scale.    Thanks, Beryl Meager, RN, BC-ADM Inpatient Diabetes Coordinator Pager [phone] (8a-5p)

## 2016-11-21 ENCOUNTER — Inpatient Hospital Stay (HOSPITAL_COMMUNITY): Payer: Self-pay

## 2016-11-21 LAB — BASIC METABOLIC PANEL
ANION GAP: 5 (ref 5–15)
BUN: 18 mg/dL (ref 6–20)
CALCIUM: 8.5 mg/dL — AB (ref 8.9–10.3)
CO2: 27 mmol/L (ref 22–32)
Chloride: 104 mmol/L (ref 101–111)
Creatinine, Ser: 1.12 mg/dL — ABNORMAL HIGH (ref 0.44–1.00)
GFR, EST NON AFRICAN AMERICAN: 57 mL/min — AB (ref >60)
GLUCOSE: 210 mg/dL — AB (ref 65–99)
POTASSIUM: 4.4 mmol/L (ref 3.5–5.1)
Sodium: 136 mmol/L (ref 135–145)

## 2016-11-21 LAB — GLUCOSE, CAPILLARY
GLUCOSE-CAPILLARY: 228 mg/dL — AB (ref 65–99)
GLUCOSE-CAPILLARY: 178 mg/dL — AB (ref 65–99)
GLUCOSE-CAPILLARY: 171 mg/dL — AB (ref 65–99)
Glucose-Capillary: 189 mg/dL — ABNORMAL HIGH (ref 65–99)
Glucose-Capillary: 229 mg/dL — ABNORMAL HIGH (ref 65–99)
Glucose-Capillary: 238 mg/dL — ABNORMAL HIGH (ref 65–99)
Glucose-Capillary: 270 mg/dL — ABNORMAL HIGH (ref 65–99)

## 2016-11-21 MED ORDER — LORAZEPAM 2 MG/ML IJ SOLN
0.5000 mg | Freq: Once | INTRAMUSCULAR | Status: AC
  Administered 2016-11-21: 0.5 mg via INTRAVENOUS
  Filled 2016-11-21: qty 1

## 2016-11-21 MED ORDER — INSULIN GLARGINE 100 UNIT/ML ~~LOC~~ SOLN
28.0000 [IU] | Freq: Every day | SUBCUTANEOUS | Status: DC
  Administered 2016-11-21: 28 [IU] via SUBCUTANEOUS
  Filled 2016-11-21 (×2): qty 0.28

## 2016-11-21 MED ORDER — DICLOFENAC SODIUM 1 % TD GEL
2.0000 g | Freq: Two times a day (BID) | TRANSDERMAL | Status: DC
  Administered 2016-11-21 – 2016-11-22 (×3): 2 g via TOPICAL
  Filled 2016-11-21: qty 100

## 2016-11-21 MED ORDER — GADOBENATE DIMEGLUMINE 529 MG/ML IV SOLN
15.0000 mL | Freq: Once | INTRAVENOUS | Status: AC | PRN
  Administered 2016-11-21: 15 mL via INTRAVENOUS

## 2016-11-21 NOTE — Progress Notes (Addendum)
Occupational Therapy Treatment Patient Details Name: Debra [name] MRN: [id number] DOB: 01/16/1966 Today's Date: 11/21/2016    History of present illness Pt is a 51 y/o female presenting with generalized weakness with an initial glucose of 998, admitted for DKA. PMH including but not limited to DM type 1, HLD, HTN and CVA in 1990.   OT comments  Pt demonstrating progress toward OT goals. She demonstrated improved activity tolerance for ADL this session and was able to complete toilet transfers with min assist and standing grooming tasks with min guard assist. Facilitated improved B UE strength with functional tasks and therapeutic exercises in preparation for ADL this session. Educated pt on fine motor coordination HEP to improve functional use of B hands. Next session plan to educate pt on use of tubing to build up utensil handles. Pt has met 2/5 initial goals and updated care plans section appropriately. D/C recommendation remains appropriate.    Follow Up Recommendations  Supervision/Assistance - 24 hour;CIR    Equipment Recommendations  Other (comment) (defer to next venue of care)    Recommendations for Other Services      Precautions / Restrictions Precautions Precautions: Fall Restrictions Weight Bearing Restrictions: No       Mobility Bed Mobility Overal bed mobility: Needs Assistance   Rolling: Min guard Sidelying to sit: Min guard       General bed mobility comments: OOB in chair on my arrival  Transfers Overall transfer level: Needs assistance Equipment used: Rolling walker (2 wheeled) Transfers: Sit to/from Stand Sit to Stand: Min assist         General transfer comment: Min assist for stability.     Balance Overall balance assessment: Needs assistance Sitting-balance support: Feet supported Sitting balance-Leahy Scale: Fair     Standing balance support: Bilateral upper extremity supported;During functional activity Standing balance-Leahy Scale:  Poor Standing balance comment: Reliant on BUE on RW                           ADL either performed or assessed with clinical judgement   ADL Overall ADL's : Needs assistance/impaired     Grooming: Min Dispensing optician: Ambulation;RW;Minimal Print production planner Details (indicate cue type and reason): Simulated with sit<>stand followed by ambulation in room.          Functional mobility during ADLs: Rolling walker;Minimal assistance General ADL Comments: Pt able to complete short distance mobiltiy this session.      Vision       Perception     Praxis      Cognition Arousal/Alertness: Awake/alert Behavior During Therapy: WFL for tasks assessed/performed Overall Cognitive Status: Within Functional Limits for tasks assessed                                          Exercises Exercises: Other exercises General Exercises - Lower Extremity Ankle Circles/Pumps: AROM;Seated;15 reps;Both Long Arc Quad: AROM;Both;10 reps;Seated Hip Flexion/Marching: AROM;AAROM;5 reps;Seated Other Exercises Other Exercises: Chair push-ups 2x10 Other Exercises: Facilitated improved B hand strength with functional task to wring out washcloths.  Other Exercises: Finger-to-palm and palm-to-finger translation with coins. Greatest difficulty with palm to finger.  Other Exercises: pinching task to pick up and place coins   Shoulder Instructions  General Comments      Pertinent Vitals/ Pain       Pain Assessment: Faces Faces Pain Scale: Hurts little more Pain Location: R quad Pain Descriptors / Indicators: Burning;Shooting;Stabbing Pain Intervention(s): Monitored during session  Home Living                                          Prior Functioning/Environment              Frequency  Min 2X/week        Progress Toward Goals  OT Goals(current goals can now be found in the care plan  section)  Progress towards OT goals: Progressing toward goals;Goals met and updated - see care plan  Acute Rehab OT Goals Patient Stated Goal: return to PLOF OT Goal Formulation: With patient Time For Goal Achievement: 12/03/16 Potential to Achieve Goals: Good ADL Goals Pt Will Perform Grooming: with modified independence;standing Pt Will Transfer to Toilet: with supervision;ambulating;bedside commode;grab bars (BSC over toilet)  Plan Discharge plan remains appropriate    Co-evaluation                 AM-PAC PT "6 Clicks" Daily Activity     Outcome Measure   Help from another person eating meals?: A Little Help from another person taking care of personal grooming?: A Little Help from another person toileting, which includes using toliet, bedpan, or urinal?: A Little Help from another person bathing (including washing, rinsing, drying)?: A Lot Help from another person to put on and taking off regular upper body clothing?: A Little Help from another person to put on and taking off regular lower body clothing?: A Lot 6 Click Score: 16    End of Session Equipment Utilized During Treatment: Gait belt;Rolling walker  OT Visit Diagnosis: Unsteadiness on feet (R26.81);Other abnormalities of gait and mobility (R26.89);Muscle weakness (generalized) (M62.81);Pain Pain - Right/Left: Right Pain - part of body: Leg   Activity Tolerance Patient tolerated treatment well;Patient limited by fatigue;Patient limited by pain   Patient Left in bed;with call bell/phone within reach;with bed alarm set;with family/visitor present   Nurse Communication          Time: 1638-4536 OT Time Calculation (min): 18 min  Charges: OT General Charges $OT Visit: 1 Visit OT Treatments $Self Care/Home Management : 8-22 mins  Norman Herrlich, MS OTR/L  Pager: [place] 11/21/2016, 5:54 PM

## 2016-11-21 NOTE — PMR Pre-admission (Signed)
PMR Admission Coordinator Pre-Admission Assessment  Patient: Debra [name] is an 51 y.o., female MRN: [id number] DOB: [birth year] Height: 5' 4" (162.6 cm) Weight: 76.2 kg (167 lb 15.9 oz)              Insurance Information HMO:     PPO:      PCP:      IPA:      80/20:      OTHER:  PRIMARY: None, uninsured       Policy#:       Subscriber:  CM Name:       Phone#:      Fax#:  Pre-Cert#:       Employer:  Benefits:  Phone #:      Name:  Eff. Date:      Deduct:       Out of Pocket Max:       Life Max:  CIR:       SNF:  Outpatient:      Co-Pay:  Home Health:       Co-Pay:  DME:      Co-Pay:  Providers:   Has been seen by Laurence Slate  Medicaid Application Date:       Case Manager:  Disability Application Date:       Case Worker:   Emergency Contact Information Contact Information    Name Relation Home Work Mobile   Hall,Veronica Aunt [phone]       Current Medical History  Patient Admitting Diagnosis: LE weakness History of Present Illness:  Debra Diguglielmo Reidis a 51 y.o.right handed femalewith history of CVA 1990, type 1 diabetes mellitus with neuropathy, DKA, fibromyalgia. Per chart review patient lives with her boyfriend. Boyfriend works during the day and no other local family can assist. One level home with 1 step to entry. She was independent until approximately 3 weeks ago. Presented 11/15/2016 with progressive weakness bilateral lower extremities as well as back pain over the past 2 months and difficulty with ambulation as well as associated diarrhea. Noted sodium 127, creatinine 2.72.UDS positive opiates. Troponin 0.47 felt to be secondary to demand ischemia. CT abdomen and chest x-ray unremarkable. Glucose was 998 she was started on insulin drip. MRI with thoracic lesion. Per reports, MRI lumbar and thoracic showed no canal stenosis or neural foraminal narrowing however there was some paraspinal muscle edema. Thoracic MRI showed a 3 x 13 mm intramedullary nonenhancing lesion T10  level. Serum CK was normal. Neurology did not recommend any further workup in regards to reported suspect lesion as there was no mass effect and cauda equina syndrome was ruled out. ESR and CK were normal. Subcutaneous heparin for DVT prophylaxis. Physical occupational therapy evaluations completed. Recommendations were later made for physical medicine rehabilitation consult and patient was admitted for a comprehensive rehabilitation program 11/21/16.       Past Medical History  Past Medical History:  Diagnosis Date  . Adenomatous colonic polyps   . Anemia    2005  . Anxiety    1990  . Arthritis   . Asthma    2000  . Cataract   . Depression 1990  . Difficult intubation    narrow airway  . Gastroesophageal Reflux Disease (GERD)   . Heart murmur    Birth  . Hyperlipidemia    2005  . Hypertension    1998  . Internal hemorrhoids 04/24/06   on colonoscopy  . Migraine   . Neuropathy of the hands & feet   .  Restless Leg Syndrome   . Right Ankle Fracture 10/06/2013  . Sleep paralysis   . Stable angina pectoris    2007: cath showing normal cors.   . Stroke 1990  . Type I Diabetes Mellitus 1988    Family History  family history includes Breast cancer in her maternal grandmother; Colon cancer in her maternal uncle; Hypertension in her father and mother; Kidney disease in her mother; Ovarian cancer in her paternal grandmother; Prostate cancer in her maternal grandfather and paternal grandfather.  Prior Rehab/Hospitalizations:  Has the patient had major surgery during 100 days prior to admission? No  Current Medications   Current Facility-Administered Medications:  .  acetaminophen (TYLENOL) tablet 650 mg, 650 mg, Oral, Q6H PRN, 650 mg at 11/20/16 2014 **OR** acetaminophen (TYLENOL) suppository 650 mg, 650 mg, Rectal, Q6H PRN, Burgess Estelle, MD .  amLODipine (NORVASC) tablet 10 mg, 10 mg, Oral, Daily, Burgess Estelle, MD, 10 mg at 11/21/16 0854 .  atorvastatin (LIPITOR) tablet 40  mg, 40 mg, Oral, Daily, Burgess Estelle, MD, 40 mg at 11/21/16 0854 .  diclofenac sodium (VOLTAREN) 1 % transdermal gel 2 g, 2 g, Topical, BID, Santos-Sanchez, Idalys, MD, 2 g at 11/21/16 0942 .  feeding supplement (GLUCERNA SHAKE) (GLUCERNA SHAKE) liquid 237 mL, 237 mL, Oral, TID BM, Santos-Sanchez, Idalys, MD, 237 mL at 11/21/16 0942 .  gabapentin (NEURONTIN) capsule 600 mg, 600 mg, Oral, TID, Santos-Sanchez, Idalys, MD, 600 mg at 11/21/16 0854 .  heparin injection 5,000 Units, 5,000 Units, Subcutaneous, Q8H, Ledell Noss, MD, 5,000 Units at 11/21/16 0539 .  insulin aspart (novoLOG) injection 0-9 Units, 0-9 Units, Subcutaneous, Q4H, Burgess Estelle, MD, 2 Units at 11/21/16 [phone] .  insulin aspart (novoLOG) injection 6 Units, 6 Units, Subcutaneous, TID WC, Santos-Sanchez, Idalys, MD, 6 Units at 11/21/16 0851 .  insulin glargine (LANTUS) injection 25 Units, 25 Units, Subcutaneous, Daily, Welford Roche, MD, 25 Units at 11/21/16 5374 .  ketorolac (TORADOL) 30 MG/ML injection 30 mg, 30 mg, Intravenous, Q6H, Burgess Estelle, MD, 30 mg at 11/21/16 0539 .  lactated ringers bolus 1,000 mL, 1,000 mL, Intravenous, Once, Geiple, Joshua, PA-C .  lisinopril (PRINIVIL,ZESTRIL) tablet 40 mg, 40 mg, Oral, Daily, Santos-Sanchez, Idalys, MD, 40 mg at 11/21/16 0854 .  metoprolol succinate (TOPROL-XL) 24 hr tablet 50 mg, 50 mg, Oral, Daily, Burgess Estelle, MD, 50 mg at 11/21/16 0854 .  promethazine (PHENERGAN) tablet 12.5 mg, 12.5 mg, Oral, Q6H PRN, Burgess Estelle, MD  Patients Current Diet: Diet Carb Modified Fluid consistency: Thin; Room service appropriate? Yes  Precautions / Restrictions Precautions Precautions: Fall Restrictions Weight Bearing Restrictions: Yes   Has the patient had 2 or more falls or a fall with injury in the past year?Yes, 10+ with no injuries per her report   Prior Activity Level Community (5-7x/wk): Prior to fall about 3 weeks ago patient was independent, driving, and working.   Since then she has declined to requiring assist with basic self care tasks.  She wants to regain her independence.   Home Assistive Devices / Equipment Home Assistive Devices/Equipment: Kasandra Knudsen (specify quad or straight) Home Equipment: Walker - 2 wheels, Wheelchair - manual, [employer] - single point  Prior Device Use: Indicate devices/aids used by the patient prior to current illness, exacerbation or injury? None of the above, prior to 3 weeks ago, then she declined to needing a wheelchair at home  Prior Functional Level Prior Function Level of Independence: Independent Comments: prior to her fall ~3 weeks ago pt was independent (including working); since  then she has had great difficulty with ambulating inside her home and performing ADLs.  Self Care: Did the patient need help bathing, dressing, using the toilet or eating? Independent  Indoor Mobility: Did the patient need assistance with walking from room to room (with or without device)? Independent  Stairs: Did the patient need assistance with internal or external stairs (with or without device)? Independent  Functional Cognition: Did the patient need help planning regular tasks such as shopping or remembering to take medications? Independent  Current Functional Level Cognition  Overall Cognitive Status: Within Functional Limits for tasks assessed Orientation Level: Oriented X4 General Comments: cognition not formally assessed but WNL for general conversation    Extremity Assessment (includes Sensation/Coordination)  Upper Extremity Assessment: Generalized weakness  Lower Extremity Assessment: Defer to PT evaluation RLE Deficits / Details: MMT revealed 2/5 for hip flexion, knee extension, knee flexion and ankle DF. Pt very limited functionally as well. Pt with impaired sensation to light touch throughout. RLE: Unable to fully assess due to pain RLE Sensation: decreased light touch RLE Coordination: decreased fine motor, decreased gross  motor LLE Deficits / Details: MMT revealed 2/5 for hip flexion, knee flexion, knee extension and ankle DF. Pt very limited functionally as well. Pt with impaired sensation to light touch throughout. LLE: Unable to fully assess due to pain LLE Sensation: decreased light touch LLE Coordination: decreased fine motor, decreased gross motor    ADLs  Overall ADL's : Needs assistance/impaired Eating/Feeding: Set up, Bed level Grooming: Min guard, Sitting Grooming Details (indicate cue type and reason): Sitting EOB pt required Min Guard A due to poor balance and feeling dizzy. Pt requiring Mod-Max A to correct balance during single LOB while at EOB (posterior lean). Upper Body Bathing: Minimal assistance, Sitting Lower Body Bathing: Moderate assistance, Sit to/from stand Lower Body Bathing Details (indicate cue type and reason): Pt cleaned up after BM in bed with Mod A. Pt performing sit<>Stand with Mod A and then maintain standing with Min A and RW. Pt abel to reach back to perform toilet hygiene. However, requires assistance to make sure she was clean.  Upper Body Dressing : Minimal assistance, Sitting Lower Body Dressing: Maximal assistance, Sit to/from stand Lower Body Dressing Details (indicate cue type and reason): Pt attempt to adjust socks but became dizzy. Functional mobility during ADLs: Moderate assistance, Rolling walker, Cueing for sequencing, Cueing for safety (sit<>Stand only) General ADL Comments: Pt demonstrating decreased functional performance and requiring Min A for UB ADLs and Mod-max for LB ADLs. Pt limited by pain, fatigue, and dizziness.     Mobility  Overal bed mobility: Needs Assistance Bed Mobility: Rolling, Sidelying to Sit, Sit to Sidelying Rolling: Min guard Sidelying to sit: Min guard Sit to sidelying: Min assist General bed mobility comments: Pt seated in recliner on arrival.      Transfers  Overall transfer level: Needs assistance Equipment used: Rolling walker  (2 wheeled) Transfers: Sit to/from Stand Sit to Stand: Mod assist General transfer comment: Assist to boost into standing with cues for hand placement to and from seated surface.  Pt with improved technique from previous standing trials.  In standing patient performed weight shifting and marching in place.      Ambulation / Gait / Stairs / Wheelchair Mobility  Ambulation/Gait Ambulation/Gait assistance: Min assist, Mod assist Ambulation Distance (Feet): 60 Feet Assistive device: Rolling walker (2 wheeled) Gait Pattern/deviations: Step-to pattern, Decreased stride length, Trunk flexed, Decreased dorsiflexion - right, Wide base of support General Gait Details:  Pt performed weight shifting and marching pre gait and tolerated well.  During gait training patient presents with forward flexion and buckling in R knee.  Pt required cues for hip extension, quad control and B heel strike.   Gait velocity interpretation: Below normal speed for age/gender    Posture / Balance Dynamic Sitting Balance Sitting balance - Comments: single LOB during sitting Balance Overall balance assessment: Needs assistance Sitting-balance support: Feet supported, No upper extremity supported Sitting balance-Leahy Scale: Fair Sitting balance - Comments: single LOB during sitting Postural control: Posterior lean Standing balance support: Bilateral upper extremity supported, During functional activity Standing balance-Leahy Scale: Poor Standing balance comment: Reliant on BUE on RW    Special needs/care consideration BiPAP/CPAP: no CPM: No Continuous Drip IV: No Dialysis: No         Life Vest: No Oxygen: No Special Bed: No Trach Size: No Wound Vac (area): No       Skin: WDL, dry, and patient reports skin discoloration in her calves  Bowel mgmt: Intermittent incontinence, last BM 11/19/16 Bladder mgmt: Acute urinary retention, requiring in and out catheterizations  Diabetic mgmt: Yes type 1, managed with CBG checks  and sliding scale insulin per patient report       Previous Home Environment Living Arrangements: Spouse/significant other Available Help at Discharge: Family, Friend(s), Available PRN/intermittently Type of Home: House Home Layout: One level Home Access: Stairs to enter Technical brewer of Steps: 1 Bathroom Shower/Tub: Tub/shower unit, Charity fundraiser: Handicapped height Home Care Services: No  Discharge Living Setting Plans for Discharge Living Setting: Patient's home, Lives with (comment) (boyfriend ) Type of Home at Discharge: House Discharge Home Layout: One level Discharge Home Access: Stairs to enter Entrance Stairs-Rails: None Entrance Stairs-Number of Steps: 1 Discharge Bathroom Shower/Tub: Tub/shower unit, Door Discharge Bathroom Toilet: Handicapped height Discharge Bathroom Accessibility: Yes How Accessible: Accessible via walker Does the patient have any problems obtaining your medications?: Yes (Describe) (she is uninsured )  Social/Family/Support Systems Patient Roles: Other (Comment) (significant other ) Anticipated Caregiver: Boyfriend PRN, he works 12 hour shifts  Ability/Limitations of Caregiver: He works 12 hour shifts and is only able to assist at night   Caregiver Availability: Intermittent Discharge Plan Discussed with Primary Caregiver: No (discussed with patient ) Is Caregiver In Agreement with Plan?:  (Patient in agreement with plan ) Does Caregiver/Family have Issues with Lodging/Transportation while Pt is in Rehab?: No  Goals/Additional Needs Patient/Family Goal for Rehab: PT/OT Supervision-Min A Expected length of stay: 17-20 days  Cultural Considerations: None Dietary Needs: Carb. Mod. diet restrictions  Equipment Needs: TBD Special Service Needs: Patient has been in contact with her financial counselor and is monitoring if she is a Medicaid candidate  Additional Information: None Pt/Family Agrees to Admission and willing to  participate: Yes Program Orientation Provided & Reviewed with Pt/Caregiver Including Roles  & Responsibilities: Yes Additional Information Needs: Wants aunt to remain as her emergency contact  Information Needs to be Provided By: FYI  Barriers to Discharge: Decreased caregiver support, Neurogenic Bowel & Bladder  Decrease burden of Care through IP rehab admission: No  Possible need for SNF placement upon discharge: No  Patient Condition: This patient's medical and functional status has changed since the consult dated 11/21/16 in which the Rehabilitation Physician determined and documented that the patient was potentially appropriate for intensive rehabilitative care in an inpatient rehabilitation facility. Issues have been addressed and update has been discussed with Dr. Naaman Plummer and patient now appropriate for inpatient rehabilitation. Will  admit to inpatient rehab today.   Preadmission Screen Completed By:  Gunnar Fusi, 11/21/2016 11:20 AM ______________________________________________________________________   Discussed status with Dr. Naaman Plummer on 11/21/16 at 1125 and received telephone approval for admission today.  Admission Coordinator:  Gunnar Fusi, time 1125/Date 11/21/16

## 2016-11-21 NOTE — Progress Notes (Signed)
Pt s/p foley catheter, due to void at 0000. Pt bladder scanned for 770. Pt obtained order for in/out catheter for retention. This RN and Lawyer at pt bedside. Catheter successful, pt tolerated well. Pt educated on in/out catheter. Pt due to void at 1130. WCTM

## 2016-11-21 NOTE — Progress Notes (Signed)
   Subjective:  No acute events overnight. Continues to have pain in bilateral extremities, but stable. Patient continues to ambulate in hallway and able to ambulate longer distance yesterday. Continues to eat. States she gets nauseous after eating half of the meal, but improving. Foley removed yesterday and patient remains unable to void.   Objective:  Vital signs in last 24 hours: Vitals:   11/20/16 1226 11/20/16 1442 11/20/16 2020 11/21/16 0423  BP: (!) 179/90 133/61 (!) 127/59 (!) 160/85  Pulse:  77 79 69  Resp:  '16 20 20  '$ Temp:  98.2 F (36.8 C) (!) 97.4 F (36.3 C) 97.8 F (36.6 C)  TempSrc:  Oral Oral Oral  SpO2:  100% 100% 98%  Weight:      Height:       General: pleasant female, thin, well-developed, sleeping in bed in no acute distress  Cardiac: regular rate and rhythm, nl S1/S2, no murmurs, rubs or gallops  Pulm: CTAB, no wheezes or crackles, no increased work of breathing  Abd: soft, NTND, bowel sounds present  Neuro: A&Ox3, active range of motion continues to improve on LLE, still unable to elevate RLE, passive range of motion improved from yesterday bilaterally, 5/5 dorsi and plantarflexion, areflexia of both lower extremities  Ext: warm and well perfused, no peripheral edema, 2+ DP pulses bilaterally     Assessment/Plan:  Debra [name] is a 51 y.o. 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   # Bilateral hip pain and weakness: Unclear etiology at this time. Cauda equina ruled out. MRI thoracic with contrast showed a non-enhancing lesion at T10. ESR and CK were normal. Normal b/l hip XRs. Cauda equina ruled out. MRI thoracic with contrast showed a non-enhancing lesion at T10. ESR and CK were normal. Normal b/l hip XRs. Per neurology, lesion is chronic in nature given  non-enhancement on MRI and not thought to be related to her acute complaints. There is concern for possible psychogenic etiology as well. Will obtain second opinion for neurology. Per CIR coordinator she has a bed available today.  - Continue IV toradol 30 mg q6 hours and gabapentin 600 mg TID  - K-pad and voltaren gel per patient's request   # Urinary retention: Failed trial of void. Neurogenic bladder from uncontrolled DM unlikely given acuity of rentention.  - Will place Foley catheter again  - Will obtain second opinion form neurology as to possible etiology. Will plan to consult urology if no improvement.   # DKA: Resolved  # T1DM, uncontrolled  - NS @ 125cc/hr  - SQ Lantus 25U QHS + Novolog 6U TID with meals  - SSI-S   # AKI: resolved  - will continue to monitor    # HTN: BP improved overnight. Hypertensive this AM BP 160/85 - Continue home amlodipine 10 mg QD + lisinopril 40 mg QD   - Continue metoprolol to 50 mg daily   F: None  E:  Will continue to monitor and replete as needed  N: CM + supplements   VTE ppx: SQ heparin   Code status: Full code, not confirmed   Dispo: Anticipated discharge to CIR approximately 1-2 days.   Welford Roche, MD  Internal Medicine PGY-1  P [phone]

## 2016-11-21 NOTE — Progress Notes (Signed)
Verbal order received for Foley catheter insertion related to 1000cc bladder scan.

## 2016-11-21 NOTE — Progress Notes (Signed)
Inpatient Rehabilitation  Patient is not medically ready for IP Rehab today as acute medical work-up with neuro is ongoing. Will follow up tomorrow in hopes of medical readiness.  Discussed with medical team.  Call if questions.   Charlane Ferretti., CCC/SLP Admission Coordinator  [facility]  Cell [phone]

## 2016-11-21 NOTE — Progress Notes (Deleted)
Debra Staggers, MD Physician Signed Physical Medicine and Rehabilitation  PMR Pre-admission Date of Service: 11/21/2016 11:20 AM  Related encounter: ED to Hosp-Admission (Current) from 11/15/2016 in [place]       _0 Hide copied text PMR Admission Coordinator Pre-Admission Assessment  Patient: Debra [name] is an 51 y.o., female MRN: [id number] DOB: [birth year] Height: _1  (162.6 cm) Weight: 76.2 kg (167 lb 15.9 oz)                                                                                                                                                  Insurance Information HMO:     PPO:      PCP:      IPA:      80/20:      OTHER:  PRIMARY: None, uninsured       Policy#:       Subscriber:  CM Name:       Phone#:      Fax#:  Pre-Cert#:       Employer:  Benefits:  Phone #:      Name:  Eff. Date:      Deduct:       Out of Pocket Max:       Life Max:  CIR:       SNF:  Outpatient:      Co-Pay:  Home Health:       Co-Pay:  DME:      Co-Pay:  Providers:   Has been seen by Laurence Slate  Medicaid Application Date:       Case Manager:  Disability Application Date:       Case Worker:   Emergency Contact Information        Contact Information    Name Relation Home Work Mobile   [name],Debra Aunt [phone]       Current Medical History  Patient Admitting Diagnosis: LE weakness History of Present Illness: Debra Anwar Reidis a 51 y.o.right handed femalewith history of CVA 1990, type 1 diabetes mellitus with neuropathy, DKA, fibromyalgia. Per chart review patient lives with her boyfriend. Boyfriend works during the day and no other local family can assist. One level home with 1 step to entry. She was independent until approximately 3 weeks ago. Presented 11/15/2016 with progressive weakness bilateral lower extremities as well as back pain over the past 2 months and difficulty with ambulation as well as associated diarrhea. Noted sodium 127,  creatinine 2.72.UDS positive opiates. Troponin 0.47 felt to be secondary to demand ischemia. CT abdomen and chest x-ray unremarkable. Glucose was 998 she was started on insulin drip. MRI with thoracic lesion. Per reports, MRI lumbar and thoracic showed no canal stenosis or neural foraminal narrowing however there was some paraspinal muscle edema. Thoracic MRI showed a 3 x 13 mm intramedullary nonenhancing lesion T10  level. Serum CK was normal.Neurology did not recommend any further workup in regards to reported suspect lesion as there was no mass effect and cauda equina syndrome was ruled out. ESR and CK were normal. Subcutaneous heparin for DVT prophylaxis. Physical occupational therapy evaluations completed. Recommendations were later made for physical medicine rehabilitation consult and patient was admitted for a comprehensive rehabilitation program 11/21/16.   Past Medical History      Past Medical History:  Diagnosis Date  . Adenomatous colonic polyps   . Anemia    2005  . Anxiety    1990  . Arthritis   . Asthma    2000  . Cataract   . Depression 1990  . Difficult intubation    narrow airway  . Gastroesophageal Reflux Disease (GERD)   . Heart murmur    Birth  . Hyperlipidemia    2005  . Hypertension    1998  . Internal hemorrhoids 04/24/06   on colonoscopy  . Migraine   . Neuropathy of the hands & feet   . Restless Leg Syndrome   . Right Ankle Fracture 10/06/2013  . Sleep paralysis   . Stable angina pectoris    2007: cath showing normal cors.   . Stroke 1990  . Type I Diabetes Mellitus 1988    Family History  family history includes Breast cancer in her maternal grandmother; Colon cancer in her maternal uncle; Hypertension in her father and mother; Kidney disease in her mother; Ovarian cancer in her paternal grandmother; Prostate cancer in her maternal grandfather and paternal grandfather.  Prior Rehab/Hospitalizations:  Has the patient had  major surgery during 100 days prior to admission? No  Current Medications   Current Facility-Administered Medications:  .  acetaminophen (TYLENOL) tablet 650 mg, 650 mg, Oral, Q6H PRN, 650 mg at 11/20/16 2014 **OR** acetaminophen (TYLENOL) suppository 650 mg, 650 mg, Rectal, Q6H PRN, Burgess Estelle, MD .  amLODipine (NORVASC) tablet 10 mg, 10 mg, Oral, Daily, Burgess Estelle, MD, 10 mg at 11/21/16 0854 .  atorvastatin (LIPITOR) tablet 40 mg, 40 mg, Oral, Daily, Burgess Estelle, MD, 40 mg at 11/21/16 0854 .  diclofenac sodium (VOLTAREN) 1 % transdermal gel 2 g, 2 g, Topical, BID, Santos-Sanchez, Idalys, MD, 2 g at 11/21/16 0942 .  feeding supplement (GLUCERNA SHAKE) (GLUCERNA SHAKE) liquid 237 mL, 237 mL, Oral, TID BM, Santos-Sanchez, Idalys, MD, 237 mL at 11/21/16 0942 .  gabapentin (NEURONTIN) capsule 600 mg, 600 mg, Oral, TID, Santos-Sanchez, Idalys, MD, 600 mg at 11/21/16 0854 .  heparin injection 5,000 Units, 5,000 Units, Subcutaneous, Q8H, Ledell Noss, MD, 5,000 Units at 11/21/16 0539 .  insulin aspart (novoLOG) injection 0-9 Units, 0-9 Units, Subcutaneous, Q4H, Burgess Estelle, MD, 2 Units at 11/21/16 [phone] .  insulin aspart (novoLOG) injection 6 Units, 6 Units, Subcutaneous, TID WC, Santos-Sanchez, Idalys, MD, 6 Units at 11/21/16 0851 .  insulin glargine (LANTUS) injection 25 Units, 25 Units, Subcutaneous, Daily, Welford Roche, MD, 25 Units at 11/21/16 1884 .  ketorolac (TORADOL) 30 MG/ML injection 30 mg, 30 mg, Intravenous, Q6H, Burgess Estelle, MD, 30 mg at 11/21/16 0539 .  lactated ringers bolus 1,000 mL, 1,000 mL, Intravenous, Once, Geiple, Joshua, PA-C .  lisinopril (PRINIVIL,ZESTRIL) tablet 40 mg, 40 mg, Oral, Daily, Santos-Sanchez, Idalys, MD, 40 mg at 11/21/16 0854 .  metoprolol succinate (TOPROL-XL) 24 hr tablet 50 mg, 50 mg, Oral, Daily, Burgess Estelle, MD, 50 mg at 11/21/16 0854 .  promethazine (PHENERGAN) tablet 12.5 mg, 12.5 mg, Oral, Q6H PRN, Burgess Estelle,  MD  Patients Current Diet: Diet Carb Modified Fluid consistency: Thin; Room service appropriate? Yes  Precautions / Restrictions Precautions Precautions: Fall Restrictions Weight Bearing Restrictions: Yes   Has the patient had 2 or more falls or a fall with injury in the past year?Yes, 10+ with no injuries per her report   Prior Activity Level Community (5-7x/wk): Prior to fall about 3 weeks ago patient was independent, driving, and working.  Since then she has declined to requiring assist with basic self care tasks.  She wants to regain her independence.   Home Assistive Devices / Equipment Home Assistive Devices/Equipment: Kasandra Knudsen (specify quad or straight) Home Equipment: Walker - 2 wheels, Wheelchair - manual, [employer] - single point  Prior Device Use: Indicate devices/aids used by the patient prior to current illness, exacerbation or injury? None of the above, prior to 3 weeks ago, then she declined to needing a wheelchair at home  Prior Functional Level Prior Function Level of Independence: Independent Comments: prior to her fall ~3 weeks ago pt was independent (including working); since then she has had great difficulty with ambulating inside her home and performing ADLs.  Self Care: Did the patient need help bathing, dressing, using the toilet or eating? Independent  Indoor Mobility: Did the patient need assistance with walking from room to room (with or without device)? Independent  Stairs: Did the patient need assistance with internal or external stairs (with or without device)? Independent  Functional Cognition: Did the patient need help planning regular tasks such as shopping or remembering to take medications? Independent  Current Functional Level Cognition  Overall Cognitive Status: Within Functional Limits for tasks assessed Orientation Level: Oriented X4 General Comments: cognition not formally assessed but WNL for general conversation    Extremity  Assessment (includes Sensation/Coordination)  Upper Extremity Assessment: Generalized weakness  Lower Extremity Assessment: Defer to PT evaluation RLE Deficits / Details: MMT revealed 2/5 for hip flexion, knee extension, knee flexion and ankle DF. Pt very limited functionally as well. Pt with impaired sensation to light touch throughout. RLE: Unable to fully assess due to pain RLE Sensation: decreased light touch RLE Coordination: decreased fine motor, decreased gross motor LLE Deficits / Details: MMT revealed 2/5 for hip flexion, knee flexion, knee extension and ankle DF. Pt very limited functionally as well. Pt with impaired sensation to light touch throughout. LLE: Unable to fully assess due to pain LLE Sensation: decreased light touch LLE Coordination: decreased fine motor, decreased gross motor    ADLs  Overall ADL's : Needs assistance/impaired Eating/Feeding: Set up, Bed level Grooming: Min guard, Sitting Grooming Details (indicate cue type and reason): Sitting EOB pt required Min Guard A due to poor balance and feeling dizzy. Pt requiring Mod-Max A to correct balance during single LOB while at EOB (posterior lean). Upper Body Bathing: Minimal assistance, Sitting Lower Body Bathing: Moderate assistance, Sit to/from stand Lower Body Bathing Details (indicate cue type and reason): Pt cleaned up after BM in bed with Mod A. Pt performing sit<>Stand with Mod A and then maintain standing with Min A and RW. Pt abel to reach back to perform toilet hygiene. However, requires assistance to make sure she was clean.  Upper Body Dressing : Minimal assistance, Sitting Lower Body Dressing: Maximal assistance, Sit to/from stand Lower Body Dressing Details (indicate cue type and reason): Pt attempt to adjust socks but became dizzy. Functional mobility during ADLs: Moderate assistance, Rolling walker, Cueing for sequencing, Cueing for safety (sit<>Stand only) General ADL Comments: Pt demonstrating  decreased  functional performance and requiring Min A for UB ADLs and Mod-max for LB ADLs. Pt limited by pain, fatigue, and dizziness.     Mobility  Overal bed mobility: Needs Assistance Bed Mobility: Rolling, Sidelying to Sit, Sit to Sidelying Rolling: Min guard Sidelying to sit: Min guard Sit to sidelying: Min assist General bed mobility comments: Pt seated in recliner on arrival.      Transfers  Overall transfer level: Needs assistance Equipment used: Rolling walker (2 wheeled) Transfers: Sit to/from Stand Sit to Stand: Mod assist General transfer comment: Assist to boost into standing with cues for hand placement to and from seated surface.  Pt with improved technique from previous standing trials.  In standing patient performed weight shifting and marching in place.      Ambulation / Gait / Stairs / Wheelchair Mobility  Ambulation/Gait Ambulation/Gait assistance: Min assist, Mod assist Ambulation Distance (Feet): 60 Feet Assistive device: Rolling walker (2 wheeled) Gait Pattern/deviations: Step-to pattern, Decreased stride length, Trunk flexed, Decreased dorsiflexion - right, Wide base of support General Gait Details: Pt performed weight shifting and marching pre gait and tolerated well.  During gait training patient presents with forward flexion and buckling in R knee.  Pt required cues for hip extension, quad control and B heel strike.   Gait velocity interpretation: Below normal speed for age/gender    Posture / Balance Dynamic Sitting Balance Sitting balance - Comments: single LOB during sitting Balance Overall balance assessment: Needs assistance Sitting-balance support: Feet supported, No upper extremity supported Sitting balance-Leahy Scale: Fair Sitting balance - Comments: single LOB during sitting Postural control: Posterior lean Standing balance support: Bilateral upper extremity supported, During functional activity Standing balance-Leahy Scale: Poor Standing  balance comment: Reliant on BUE on RW    Special needs/care consideration BiPAP/CPAP: no CPM: No Continuous Drip IV: No Dialysis: No         Life Vest: No Oxygen: No Special Bed: No Trach Size: No Wound Vac (area): No       Skin: WDL, dry, and patient reports skin discoloration in her calves  Bowel mgmt: Intermittent incontinence, last BM 11/19/16 Bladder mgmt: Acute urinary retention, requiring in and out catheterizations  Diabetic mgmt: Yes type 1, managed with CBG checks and sliding scale insulin per patient report       Previous Home Environment Living Arrangements: Spouse/significant other Available Help at Discharge: Family, Friend(s), Available PRN/intermittently Type of Home: House Home Layout: One level Home Access: Stairs to enter Technical brewer of Steps: 1 Bathroom Shower/Tub: Tub/shower unit, Charity fundraiser: Handicapped height Home Care Services: No  Discharge Living Setting Plans for Discharge Living Setting: Patient's home, Lives with (comment) (boyfriend ) Type of Home at Discharge: House Discharge Home Layout: One level Discharge Home Access: Stairs to enter Entrance Stairs-Rails: None Entrance Stairs-Number of Steps: 1 Discharge Bathroom Shower/Tub: Tub/shower unit, Door Discharge Bathroom Toilet: Handicapped height Discharge Bathroom Accessibility: Yes How Accessible: Accessible via walker Does the patient have any problems obtaining your medications?: Yes (Describe) (she is uninsured )  Social/Family/Support Systems Patient Roles: Other (Comment) (significant other ) Anticipated Caregiver: Boyfriend PRN, he works 12 hour shifts  Ability/Limitations of Caregiver: He works 12 hour shifts and is only able to assist at night   Caregiver Availability: Intermittent Discharge Plan Discussed with Primary Caregiver: No (discussed with patient ) Is Caregiver In Agreement with Plan?:  (Patient in agreement with plan ) Does Caregiver/Family  have Issues with Lodging/Transportation while Pt is in Rehab?: No  Goals/Additional Needs Patient/Family  Goal for Rehab: PT/OT Supervision-Min A Expected length of stay: 17-20 days  Cultural Considerations: None Dietary Needs: Carb. Mod. diet restrictions  Equipment Needs: TBD Special Service Needs: Patient has been in contact with her financial counselor and is monitoring if she is a Medicaid candidate  Additional Information: None Pt/Family Agrees to Admission and willing to participate: Yes Program Orientation Provided & Reviewed with Pt/Caregiver Including Roles  & Responsibilities: Yes Additional Information Needs: Wants aunt to remain as her emergency contact  Information Needs to be Provided By: FYI  Barriers to Discharge: Decreased caregiver support, Neurogenic Bowel & Bladder  Decrease burden of Care through IP rehab admission: No  Possible need for SNF placement upon discharge: No  Patient Condition: This patient's medical and functional status has changed since the consult dated 11/21/16 in which the Rehabilitation Physician determined and documented that the patient was potentially appropriate for intensive rehabilitative care in an inpatient rehabilitation facility. Issues have been addressed and update has been discussed with Dr. Naaman Plummer and patient now appropriate for inpatient rehabilitation. Will admit to inpatient rehab today.   Preadmission Screen Completed By:  Gunnar Fusi, 11/21/2016 11:20 AM ______________________________________________________________________   Discussed status with Dr. Naaman Plummer on 11/21/16 at 1125 and received telephone approval for admission today.  Admission Coordinator:  Gunnar Fusi, time 1125/Date 11/21/16       Revision History

## 2016-11-21 NOTE — Progress Notes (Signed)
Physical Therapy Treatment Patient Details Name: Debra [name] MRN: [id number] DOB: 1965/05/03 Today's Date: 11/21/2016    History of Present Illness Pt is a 51 y/o female presenting with generalized weakness with an initial glucose of 998, admitted for DKA. PMH including but not limited to DM type 1, HLD, HTN and CVA in 1990.    PT Comments    Patient progressing with gait distance, but still limited by pain seemingly neuropathic and fatigue.  Continue to feel she is appropriate for CIR level rehab prior to d/c home.    Follow Up Recommendations  CIR     Equipment Recommendations  None recommended by PT;Other (comment) (defer to next venue)    Recommendations for Other Services       Precautions / Restrictions Precautions Precautions: Fall Restrictions Weight Bearing Restrictions: Yes    Mobility  Bed Mobility Overal bed mobility: Needs Assistance   Rolling: Min guard Sidelying to sit: Min guard       General bed mobility comments: assist for safety/balance, cues for technique  Transfers Overall transfer level: Needs assistance Equipment used: Rolling walker (2 wheeled) Transfers: Sit to/from Stand Sit to Stand: Mod assist;Min assist         General transfer comment: lifting assist, cues for hand placement  Ambulation/Gait Ambulation/Gait assistance: Min assist Ambulation Distance (Feet): 140 Feet Assistive device: Rolling walker (2 wheeled) Gait Pattern/deviations: Step-to pattern;Decreased stride length;Step-through pattern;Wide base of support;Decreased stance time - right     General Gait Details: R knee buckling intermittently, cues for trunk extension due to increasing back pain with gait and flexed forward to visualize feet due to numbness    Stairs            Wheelchair Mobility    Modified Rankin (Stroke Patients Only)       Balance Overall balance assessment: Needs assistance Sitting-balance support: Feet supported Sitting  balance-Leahy Scale: Fair     Standing balance support: Bilateral upper extremity supported;During functional activity Standing balance-Leahy Scale: Poor Standing balance comment: Reliant on BUE on RW                            Cognition Arousal/Alertness: Awake/alert Behavior During Therapy: WFL for tasks assessed/performed Overall Cognitive Status: Within Functional Limits for tasks assessed                                        Exercises General Exercises - Lower Extremity Ankle Circles/Pumps: AROM;Seated;15 reps;Both Long Arc Quad: AROM;Both;10 reps;Seated Hip Flexion/Marching: AROM;AAROM;5 reps;Seated    General Comments        Pertinent Vitals/Pain Pain Assessment: Faces Faces Pain Scale: Hurts even more Pain Location: R quad Pain Descriptors / Indicators: Burning;Shooting;Stabbing Pain Intervention(s): Monitored during session;Repositioned;Patient requesting pain meds-RN notified    Home Living                      Prior Function            PT Goals (current goals can now be found in the care plan section) Progress towards PT goals: Progressing toward goals    Frequency    Min 3X/week      PT Plan Current plan remains appropriate    Co-evaluation              AM-PAC PT "6 Clicks" Daily Activity  Outcome Measure  Difficulty turning over in bed (including adjusting bedclothes, sheets and blankets)?: Unable Difficulty moving from lying on back to sitting on the side of the bed? : Unable Difficulty sitting down on and standing up from a chair with arms (e.g., wheelchair, bedside commode, etc,.)?: Unable Help needed moving to and from a bed to chair (including a wheelchair)?: A Lot Help needed walking in hospital room?: A Lot Help needed climbing 3-5 steps with a railing? : Total 6 Click Score: 8    End of Session Equipment Utilized During Treatment: Gait belt Activity Tolerance: Patient limited by  pain;Patient limited by fatigue Patient left: in chair;with call bell/phone within reach   PT Visit Diagnosis: Other abnormalities of gait and mobility (R26.89);Muscle weakness (generalized) (M62.81);Pain Pain - Right/Left: Right Pain - part of body: Leg     Time: 6153-7943 PT Time Calculation (min) (ACUTE ONLY): 28 min  Charges:  $Gait Training: 8-22 mins $Therapeutic Exercise: 8-22 mins                    G CodesSheran Lawless, [name] [phone] 11/21/2016    [name] 11/21/2016, 4:38 PM

## 2016-11-21 NOTE — Progress Notes (Signed)
Verbal order to hold foley catheter related to patient voiding on her own at 1230pm, bladder scanned with post residual of 74cc.

## 2016-11-21 NOTE — Progress Notes (Signed)
Bladder scan completed, it indicates 770 ml in bladder. MD notified.

## 2016-11-21 NOTE — Progress Notes (Signed)
Subjective: Patient states she feels weak in her LE right> left. She admits to being both stool and urine incontinent. When asked how long she has not been able to feel well below her umbilicus she stated " Many years but I thought that was just part of diabetic neuropathy". The stool and urine incontinence is new over the last week. Her main complaint is pain in her back when asked to move her legs.   Objective: Current vital signs: BP (!) 156/87   Pulse 69   Temp 97.8 F (36.6 C) (Oral)   Resp 20   Ht 5\' 4"  (1.626 m)   Wt 76.2 kg (167 lb 15.9 oz)   SpO2 98%   BMI 28.84 kg/m  Vital signs in last 24 hours: Temp:  [97.4 F (36.3 C)-98.2 F (36.8 C)] 97.8 F (36.6 C) (09/25 0423) Pulse Rate:  [69-79] 69 (09/25 0423) Resp:  [16-20] 20 (09/25 0423) BP: (127-179)/(59-90) 156/87 (09/25 0854) SpO2:  [98 %-100 %] 98 % (09/25 0423)  Intake/Output from previous day: 09/24 0701 - 09/25 0700 In: 360 [P.O.:360] Out: 3275 [Urine:3275] Intake/Output this shift: No intake/output data recorded. Nutritional status: Diet Carb Modified Fluid consistency: Thin; Room service appropriate? Yes  ROS:                                                                                                                                       History obtained from the patient  General ROS: negative for - chills, fatigue, fever, night sweats, weight gain or weight loss Psychological ROS: negative for - behavioral disorder, hallucinations, memory difficulties, mood swings or suicidal ideation Ophthalmic ROS: negative for - blurry vision, double vision, eye pain or loss of vision ENT ROS: negative for - epistaxis, nasal discharge, oral lesions, sore throat, tinnitus or vertigo Allergy and Immunology ROS: negative for - hives or itchy/watery eyes Hematological and Lymphatic ROS: negative for - bleeding problems, bruising or swollen lymph nodes Endocrine ROS: negative for - galactorrhea, hair pattern changes,  polydipsia/polyuria or temperature intolerance Respiratory ROS: negative for - cough, hemoptysis, shortness of breath or wheezing Cardiovascular ROS: negative for - chest pain, dyspnea on exertion, edema or irregular heartbeat Gastrointestinal ROS: positive for -stool incontinence Genito-Urinary ROS: positive for -  incontinence  Musculoskeletal ROS: positive for -muscular weakness Neurological ROS: as noted in HPI Dermatological ROS: negative for rash and skin lesion changes    Neurologic Exam: General: NAD Mental Status: Alert, oriented, thought content appropriate.  Speech fluent without evidence of aphasia.  Able to follow 3 step commands without difficulty. Cranial Nerves: II:  Visual fields grossly normal, pupils equal, round, reactive to light and accommodation III,IV, VI: ptosis not present, extra-ocular motions intact bilaterally V,VII: smile symmetric, facial light touch sensation normal bilaterally VIII: hearing normal bilaterally IX,X: uvula rises symmetrically XI: bilateral shoulder shrug XII: midline tongue extension without atrophy or fasciculations  Motor:--much of exam is  limited by pain in back Right : Upper extremity   5/5    Left:     Upper extremity   5/5  Lower extremity   5/5     Lower extremity   5/5 --dorsiflexion/extension 5/5, Knee flexion 4/5, knee extension 5/5 but limited by pain on the right, hip flexion 4/5 but limited by pain, hip abduction 5/5 and adduction 0/5.  Tone and bulk:normal (would usually expect some wasting if this has been going on for years or months) tone throughout; no atrophy noted --No rectal tone noted on exam (effort?) Sensory: stated to be decreased from umbilicus down and in her perineal region  Deep Tendon Reflexes:  Right: Upper Extremity   Left: Upper extremity   biceps (C-5 to C-6) 2/4   biceps (C-5 to C-6) 2/4 tricep (C7) 2/4    triceps (C7) 2/4 Brachioradialis (C6) 2/4  Brachioradialis (C6) 2/4  Lower Extremity Lower  Extremity  NO REFLEXES IN LE--if central process would think they would be hyperreflexive  Plantars: Right: downgoing   Left: downgoing Cerebellar: normal finger-to-nose,  Gait: not tested  Lab Results: Basic Metabolic Panel:  Recent Labs Lab 11/17/16 0221 11/18/16 0408 11/19/16 0405 11/20/16 0442 11/21/16 0717  NA 136 136 135 135 136  K 4.7 4.0 3.7 4.6 4.4  CL 112* 110 106 104 104  CO2 19* 22 24 21* 27  GLUCOSE 171* 156* 129* 127* 210*  BUN 31* CREATININE 1.56* 1.21* 0.99 0.97 1.12*  CALCIUM 8.2* 8.1* 8.0* 8.4* 8.5*    Liver Function Tests:  Recent Labs Lab 11/15/16 1036  AST 15  ALT 27  ALKPHOS 82  BILITOT 1.7*  PROT 6.4*  ALBUMIN 3.3*   No results for input(s): LIPASE, AMYLASE in the last 168 hours. No results for input(s): AMMONIA in the last 168 hours.  CBC:  Recent Labs Lab 11/15/16 1036 11/16/16 0548  WBC 9.4 16.7*  HGB 13.7 11.9*  HCT 42.9 35.5*  MCV 90.7 84.9  PLT 272 254    Cardiac Enzymes:  Recent Labs Lab 11/16/16 1429 11/16/16 1856 11/18/16 0408  CKTOTAL  --   --  52  TROPONINI 0.64* 0.47*  --     Lipid Panel: No results for input(s): CHOL, TRIG, HDL, CHOLHDL, VLDL, LDLCALC in the last 168 hours.  CBG:  Recent Labs Lab 11/20/16 2018 11/21/16 0010 11/21/16 0419 11/21/16 0802 11/21/16 1202  GLUCAP 314* 229* 228* 178* 171*    Microbiology: Results for orders placed or performed during the hospital encounter of 11/15/16  MRSA PCR Screening     Status: None   Collection Time: 11/15/16 10:54 PM  Result Value Ref Range Status   MRSA by PCR NEGATIVE NEGATIVE Final    Comment:        The GeneXpert MRSA Assay (FDA approved for NASAL specimens only), is one component of a comprehensive MRSA colonization surveillance program. It is not intended to diagnose MRSA infection nor to guide or monitor treatment for MRSA infections.     Coagulation Studies: No results for input(s): LABPROT, INR in the last 72  hours.  Imaging: No results found.  Medications:  Prior to Admission:  Prescriptions Prior to Admission  Medication Sig Dispense Refill Last Dose  . albuterol (PROVENTIL HFA;VENTOLIN HFA) 108 (90 Base) MCG/ACT inhaler Inhale 1-2 puffs into the lungs every 6 (six) hours as needed for wheezing or shortness of breath. 3 Inhaler 0 Past Week at Unknown time  . amLODipine (NORVASC) 10  MG tablet Take 1 tablet (10 mg total) by mouth daily. 90 tablet 1 11/14/2016 at Unknown time  . aspirin EC 81 MG tablet Take 1 tablet (81 mg total) by mouth daily. 90 tablet 3 11/14/2016 at Unknown time  . atorvastatin (LIPITOR) 40 MG tablet Take 1 tablet (40 mg total) by mouth daily. 90 tablet 3 11/14/2016 at Unknown time  . 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 The patient tests 4 times per day. 1 Device 0 11/14/2016 at Unknown time  . gabapentin (NEURONTIN) 300 MG capsule Take 1 capsule (300 mg total) by mouth at bedtime. IM program 1-time fill 30 capsule 0 11/14/2016 at Unknown time  . glucose blood (AGAMATRIX AMP TEST) test strip Use as instructed 100 each 12 11/14/2016 at Unknown time  . insulin aspart (NOVOLOG) 100 UNIT/ML injection Inject 10 Units into the skin 3 (three) times daily before meals. 10 mL 5 Past Week at Unknown time  . insulin glargine (LANTUS) 100 UNIT/ML injection Inject 0.33 mLs (33 Units total) into the skin at bedtime. 10 mL 3 Past Week at Unknown time  . lisinopril-hydrochlorothiazide (PRINZIDE,ZESTORETIC) 20-12.5 MG tablet Take 2 tablets by mouth daily. 180 tablet 1 11/14/2016 at Unknown time  . Meloxicam 10 MG CAPS Take 15 mg by mouth every morning. 14 capsule 0 11/14/2016 at Unknown time  . metoprolol succinate (TOPROL XL) 100 MG 24 hr tablet Take 1 tablet (100 mg total) by mouth daily. Take with or immediately following a meal. 90 tablet 0 11/14/2016 at Unknown time  . nitroGLYCERIN (NITROSTAT) 0.4 MG SL tablet Place 1  tablet (0.4 mg total) under the tongue every 5 (five) minutes as needed for chest pain. 100 tablet 0 Past Month at Unknown time  . Insulin Pen Needle 32G X 4 MM MISC Use to inject insulin twice daily 100 each 5    Scheduled: . amLODipine  10 mg Oral Daily  . atorvastatin  40 mg Oral Daily  . diclofenac sodium  2 g Topical BID  . feeding supplement (GLUCERNA SHAKE)  237 mL Oral TID BM  . gabapentin  600 mg Oral TID  . heparin  5,000 Units Subcutaneous Q8H  . insulin aspart  0-9 Units Subcutaneous Q4H  . insulin aspart  6 Units Subcutaneous TID WC  . insulin glargine  25 Units Subcutaneous Daily  . ketorolac  30 mg Intravenous Q6H  . lisinopril  40 mg Oral Daily  . metoprolol succinate  50 mg Oral Daily    Felicie Morn PA-C [facility] [phone]    Assessment/Plan: 51 year old female with spinal cord lesion of unclear etiology. Given the chronicity of her symptoms, my suspicion is that it is noninflammatory, however I do think this still needs to be considered. Imaging of the brain and cervical spine, if it reveals other agents suspicious for multiple sclerosis would support this as an etiology for this lesion. Certainly her worsening may be due to her acute metabolic presentation superimposed on the chronic lesion. I would favor lumbar puncture as well to assess for signs of inflammation as well as cytology. If this is negative, then I think syrinx may need to be considered with consideration of surgical evaluation given that she is worsening.  1) MRI brain/C-spine 2) lumbar puncture for cells, glucose, protein, oligoclonal bands, IgG index, cytology 3) further recommendations following the above tests  Ritta Slot, MD [facility] [phone]  If 7pm- 7am, please page neurology  on call as listed in [facility].   11/21/2016, 12:19 PM

## 2016-11-21 NOTE — Progress Notes (Signed)
Inpatient Rehabilitation  I have received notification from MD that patient is medically ready for IP Rehab today.  I met with patient who is eager to regain her independence, but aware of some modifications that may need to be made to how she does things given her lack of 24/7 Supervision following her IP Rehab stay.  I have a rehab bed to offer patient today and will proceed with admission.  Please call with questions.   Carmelia Roller., CCC/SLP Admission Coordinator  [facility]  Cell [phone]

## 2016-11-21 NOTE — Progress Notes (Deleted)
Jamse Arn, MD Physician Signed Physical Medicine and Rehabilitation  Consult Note Date of Service: 11/20/2016 11:32 AM  Related encounter: ED to Hosp-Admission (Current) from 11/15/2016 in [place] All Collapse All   '[]'$ Hide copied text '[]'$ Hover for attribution information      Physical Medicine and Rehabilitation Consult Reason for Consult: Decreased functional mobility Referring Physician: Internal medicine   HPI: Debra [name] is a 51 y.o. right handed female with history of CVA 1990, type 1 diabetes mellitus with neuropathy, DKA, fibromyalgia. Per chart review patient lives with her boyfriend. Boyfriend works during the day and no other local family can assist. One level home with 1 step to entry. She was independent until approximately 3 weeks ago. Presented 11/15/2016 with progressive weakness bilateral lower extremities as well as back pain over the past 2 months and difficulty with ambulation as well as associated diarrhea. Noted sodium 127, creatinine 2.72. CT abdomen and chest x-ray unremarkable. Glucose was 998 she was started on insulin drip. MRI with thoracic lesion.  Per reports, MRI lumbar and thoracic showed no canal stenosis or neural foraminal narrowing. Thoracic MRI showed a 3 x 13 mm intramedullary nonenhancing lesion. Neurology did not recommend any further workup in regards to reported suspect lesion as there was no mass effect and cauda equina syndrome was ruled out. ESR and CK were normal. Subcutaneous heparin for DVT prophylaxis. Physical occupational therapy evaluations completed. Recommendations were later made for physical medicine rehabilitation consult.   Review of Systems  Constitutional: Negative for chills and fever.  HENT: Negative for hearing loss.   Eyes: Negative for blurred vision and double vision.  Respiratory: Negative for cough and shortness of breath.   Cardiovascular: Negative for chest pain,  palpitations and leg swelling.  Gastrointestinal: Positive for constipation. Negative for nausea.  Genitourinary: Positive for urgency. Negative for dysuria, flank pain and hematuria.  Musculoskeletal: Positive for back pain and myalgias.  Skin: Negative for rash.  Neurological: Positive for sensory change, focal weakness, weakness and headaches. Negative for seizures.  Psychiatric/Behavioral: Positive for depression.       Anxiety  All other systems reviewed and are negative.      Past Medical History:  Diagnosis Date  . Adenomatous colonic polyps   . Anemia    2005  . Anxiety    1990  . Arthritis   . Asthma    2000  . Cataract   . Depression 1990  . Difficult intubation    narrow airway  . Gastroesophageal Reflux Disease (GERD)   . Heart murmur    Birth  . Hyperlipidemia    2005  . Hypertension    1998  . Internal hemorrhoids 04/24/06   on colonoscopy  . Migraine   . Neuropathy of the hands & feet   . Restless Leg Syndrome   . Right Ankle Fracture 10/06/2013  . Sleep paralysis   . Stable angina pectoris    2007: cath showing normal cors.   . Stroke 1990  . Type I Diabetes Mellitus 1988        Past Surgical History:  Procedure Laterality Date  . bilateral foot surgery    . BREAST SURGERY Left    biopsy left breast  . CARDIAC CATHETERIZATION  2007   around 2007 or 2008  . CESAREAN SECTION    . CHOLECYSTECTOMY    . COLONOSCOPY W/ BIOPSIES AND POLYPECTOMY    . DILATION AND CURETTAGE OF UTERUS  12/29/2010   Surgeon: Osborne Oman, MD;  Location: [facility];  Service: Gynecology  . ENDOMETRIAL ABLATION W/ NOVASURE N/A 12/2009  . EYE SURGERY    . HYSTEROSCOPY  12/29/2010   Procedure: HYSTEROSCOPY WITH HYDROTHERMAL ABLATION;  Surgeon: Osborne Oman, MD;  Location: [facility];  Service: Gynecology;;  . IUD REMOVAL  12/29/2010   Procedure: INTRAUTERINE DEVICE (IUD) REMOVAL;  Surgeon: Osborne Oman, MD;  Location: [facility];  Service: Gynecology;  Laterality: N/A;  . LAPAROSCOPIC TUBAL LIGATION  12/29/2010   Procedure: LAPAROSCOPIC TUBAL LIGATION;  Surgeon: Osborne Oman, MD;  Location: [facility];  Service: Gynecology;  Laterality: Bilateral;  . ORIF ANKLE FRACTURE Right 10/09/2013   Procedure: Open reduction internal fixation right ankle;  Surgeon: Nita Sells, MD;  Location: [facility];  Service: Orthopedics;  Laterality: Right;  Open reduction internal fixation right ankle  . TRIGGER FINGER RELEASE     x 3        Family History  Problem Relation Age of Onset  . Hypertension Mother   . Kidney disease Mother   . Hypertension Father   . Breast cancer Maternal Grandmother   . Prostate cancer Maternal Grandfather   . Ovarian cancer Paternal Grandmother   . Prostate cancer Paternal Grandfather   . Colon cancer Maternal Uncle        Family history of malignant neoplasm of gastrointestinal tract   Social History:  reports that she has never smoked. She has never used smokeless tobacco. She reports that she does not drink alcohol or use drugs. Allergies:       Allergies  Allergen Reactions  . Penicillins Anaphylaxis, Nausea And Vomiting and Rash    Has patient had a PCN reaction causing immediate rash, facial/tongue/throat swelling, SOB or lightheadedness with hypotension: Yes Has patient had a PCN reaction causing severe rash involving mucus membranes or skin necrosis: Yes Has patient had a PCN reaction that required hospitalization No Has patient had a PCN reaction occurring within the last 10 years: Yes If all of the above answers are "NO", then may proceed with Cephalosporin use.   . Pollen Extract     Seasonal Allergies  . Tape Rash         Medications Prior to Admission  Medication Sig Dispense Refill  . albuterol (PROVENTIL HFA;VENTOLIN HFA) 108 (90 Base) MCG/ACT inhaler Inhale 1-2 puffs into the lungs every 6 (six) hours as needed for wheezing or shortness of  breath. 3 Inhaler 0  . amLODipine (NORVASC) 10 MG tablet Take 1 tablet (10 mg total) by mouth daily. 90 tablet 1  . aspirin EC 81 MG tablet Take 1 tablet (81 mg total) by mouth daily. 90 tablet 3  . atorvastatin (LIPITOR) 40 MG tablet Take 1 tablet (40 mg total) by mouth daily. 90 tablet 3  . 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 The patient tests 4 times per day. 1 Device 0  . gabapentin (NEURONTIN) 300 MG capsule Take 1 capsule (300 mg total) by mouth at bedtime. IM program 1-time fill 30 capsule 0  . glucose blood (AGAMATRIX AMP TEST) test strip Use as instructed 100 each 12  . insulin aspart (NOVOLOG) 100 UNIT/ML injection Inject 10 Units into the skin 3 (three) times daily before meals. 10 mL 5  . insulin glargine (LANTUS) 100 UNIT/ML injection Inject 0.33 mLs (33 Units total) into the skin at bedtime. 10 mL 3  .  lisinopril-hydrochlorothiazide (PRINZIDE,ZESTORETIC) 20-12.5 MG tablet Take 2 tablets by mouth daily. 180 tablet 1  . Meloxicam 10 MG CAPS Take 15 mg by mouth every morning. 14 capsule 0  . metoprolol succinate (TOPROL XL) 100 MG 24 hr tablet Take 1 tablet (100 mg total) by mouth daily. Take with or immediately following a meal. 90 tablet 0  . nitroGLYCERIN (NITROSTAT) 0.4 MG SL tablet Place 1 tablet (0.4 mg total) under the tongue every 5 (five) minutes as needed for chest pain. 100 tablet 0  . Insulin Pen Needle 32G X 4 MM MISC Use to inject insulin twice daily 100 each 5    Home: Home Living Family/patient expects to be discharged to:: Private residence Living Arrangements: Spouse/significant other Available Help at Discharge: Family, Friend(s), Available PRN/intermittently Type of Home: House Home Access: Stairs to enter Technical brewer of Steps: 1 Home Layout: One level Bathroom Shower/Tub: Tub/shower unit, Door Bathroom Toilet: Handicapped height Home Equipment: Environmental consultant - 2 wheels,  Wheelchair - manual, [employer] - single point  Functional History: Prior Function Level of Independence: Independent Comments: prior to her fall ~3 weeks ago pt was independent (including working); since then she has had great difficulty with ambulating inside her home and performing ADLs. Functional Status:  Mobility: Bed Mobility Overal bed mobility: Needs Assistance Bed Mobility: Rolling, Sidelying to Sit, Sit to Sidelying Rolling: Min guard Sidelying to sit: Min guard Sit to sidelying: Min assist General bed mobility comments: Pt seated in recliner on arrival.   Transfers Overall transfer level: Needs assistance Equipment used: Rolling walker (2 wheeled) Transfers: Sit to/from Stand Sit to Stand: Mod assist General transfer comment: Assist to boost into standing with cues for hand placement to and from seated surface.  Pt with improved technique from previous standing trials.  In standing patient performed weight shifting and marching in place.   Ambulation/Gait Ambulation/Gait assistance: Min assist, Mod assist Ambulation Distance (Feet): 60 Feet Assistive device: Rolling walker (2 wheeled) Gait Pattern/deviations: Step-to pattern, Decreased stride length, Trunk flexed, Decreased dorsiflexion - right, Wide base of support General Gait Details: Pt performed weight shifting and marching pre gait and tolerated well.  During gait training patient presents with forward flexion and buckling in R knee.  Pt required cues for hip extension, quad control and B heel strike.   Gait velocity interpretation: Below normal speed for age/gender  ADL: ADL Overall ADL's : Needs assistance/impaired Eating/Feeding: Set up, Bed level Grooming: Min guard, Sitting Grooming Details (indicate cue type and reason): Sitting EOB pt required Min Guard A due to poor balance and feeling dizzy. Pt requiring Mod-Max A to correct balance during single LOB while at EOB (posterior lean). Upper Body Bathing: Minimal  assistance, Sitting Lower Body Bathing: Moderate assistance, Sit to/from stand Lower Body Bathing Details (indicate cue type and reason): Pt cleaned up after BM in bed with Mod A. Pt performing sit<>Stand with Mod A and then maintain standing with Min A and RW. Pt abel to reach back to perform toilet hygiene. However, requires assistance to make sure she was clean.  Upper Body Dressing : Minimal assistance, Sitting Lower Body Dressing: Maximal assistance, Sit to/from stand Lower Body Dressing Details (indicate cue type and reason): Pt attempt to adjust socks but became dizzy. Functional mobility during ADLs: Moderate assistance, Rolling walker, Cueing for sequencing, Cueing for safety (sit<>Stand only) General ADL Comments: Pt demonstrating decreased functional performance and requiring Min A for UB ADLs and Mod-max for LB ADLs. Pt limited by pain, fatigue, and dizziness.  Cognition: Cognition Overall Cognitive Status: Within Functional Limits for tasks assessed Orientation Level: Oriented X4 Cognition Arousal/Alertness: Awake/alert Behavior During Therapy: Flat affect Overall Cognitive Status: Within Functional Limits for tasks assessed General Comments: cognition not formally assessed but WNL for general conversation  Blood pressure (!) 219/96, pulse 70, temperature 97.7 F (36.5 C), temperature source Oral, resp. rate 15, height '5\' 4"'$  (1.626 m), weight 76.2 kg (167 lb 15.9 oz), SpO2 100 %. Physical Exam  Vitals reviewed. Constitutional: She is oriented to person, place, and time. She appears well-developed and well-nourished.  HENT:  Head: Normocephalic and atraumatic.  Eyes: EOM are normal. Right eye exhibits no discharge. Left eye exhibits no discharge.  Neck: Normal range of motion. Neck supple. No thyromegaly present.  Cardiovascular: Normal rate, regular rhythm and normal heart sounds.   Respiratory: Effort normal and breath sounds normal. No respiratory distress.  GI: Soft.  Bowel sounds are normal. She exhibits no distension.  Musculoskeletal: She exhibits no edema or tenderness.  Neurological: She is alert and oriented to person, place, and time.  Motor: B/l UE 5/5 proximal to distal RLE: HF, KE 2+/5, ADF/PF 3-/5 LLE: HF, KE 3+/5, ADF/PF 4-/5DTRs symmetric Sensation diminished to light touch distal knees  Skin: Skin is warm and dry.  Psychiatric: She has a normal mood and affect. Her behavior is normal. Thought content normal.    Lab Results Last 24 Hours       Results for orders placed or performed during the hospital encounter of 11/15/16 (from the past 24 hour(s))  Glucose, capillary     Status: Abnormal   Collection Time: 11/19/16 12:07 PM  Result Value Ref Range   Glucose-Capillary 181 (H) 65 - 99 mg/dL  Glucose, capillary     Status: Abnormal   Collection Time: 11/19/16  5:26 PM  Result Value Ref Range   Glucose-Capillary 208 (H) 65 - 99 mg/dL  Glucose, capillary     Status: Abnormal   Collection Time: 11/19/16  8:36 PM  Result Value Ref Range   Glucose-Capillary 329 (H) 65 - 99 mg/dL  Glucose, capillary     Status: Abnormal   Collection Time: 11/20/16 12:44 AM  Result Value Ref Range   Glucose-Capillary 181 (H) 65 - 99 mg/dL  Basic metabolic panel     Status: Abnormal   Collection Time: 11/20/16  4:42 AM  Result Value Ref Range   Sodium 135 135 - 145 mmol/L   Potassium 4.6 3.5 - 5.1 mmol/L   Chloride 104 101 - 111 mmol/L   CO2 21 (L) 22 - 32 mmol/L   Glucose, Bld 127 (H) 65 - 99 mg/dL   BUN 15 6 - 20 mg/dL   Creatinine, Ser 0.97 0.44 - 1.00 mg/dL   Calcium 8.4 (L) 8.9 - 10.3 mg/dL   GFR calc non Af Amer >60 >60 mL/min   GFR calc Af Amer >60 >60 mL/min   Anion gap 10 5 - 15  Glucose, capillary     Status: Abnormal   Collection Time: 11/20/16  5:38 AM  Result Value Ref Range   Glucose-Capillary 164 (H) 65 - 99 mg/dL  Glucose, capillary     Status: None   Collection Time: 11/20/16  8:09 AM  Result Value Ref  Range   Glucose-Capillary 88 65 - 99 mg/dL      Imaging Results (Last 48 hours)  Mr Thoracic Spine W Wo Contrast  Result Date: 11/18/2016 CLINICAL DATA:  Midthoracic pain. Abnormal spinal cord. Diabetic ketoacidosis with lower  extremity weakness EXAM: MRI THORACIC WITHOUT AND WITH CONTRAST TECHNIQUE: Multiplanar and multiecho pulse sequences of the thoracic spine were obtained without and with intravenous contrast. CONTRAST:  <See Chart> 15 mL MULTIHANCE GADOBENATE DIMEGLUMINE 529 MG/ML IV SOLN COMPARISON:  Unenhanced thoracic MRI 11/17/2016 FINDINGS: MRI THORACIC SPINE FINDINGS Alignment:  Normal Vertebrae: Negative for fracture or mass. Normal enhancement of the vertebra. Cord: Small area of hyperintensity in the cord at the T10 level is unchanged from the prior study. Again this is suboptimally evaluated on axial images but appears to be to the right of midline measuring approximately 3 mm in diameter and 13 mm cranial caudal dimension. This area does not enhance. It is difficult determine if this is a focal area of syrinx or a cord lesion. No mass-effect or enlargement of the cord Paraspinal and other soft tissues: Small bilateral pleural effusions. T2 bright with liver lesion unchanged. Disc levels: Negative for disc degeneration. IMPRESSION: Ill-defined lesion in the spinal cord at the T10 level unchanged from the prior study. This measures approximately 3 x 13 mm and does not enhance. This could represent a small syrinx however I think it more likely is represents cord lesion. This may represent an area of transverse myelitis or chronic myelomalacia. Remainder of the cord normal. Remainder of the spine normal. Electronically Signed   By: Franchot Gallo M.D.   On: 11/18/2016 18:38     Assessment/Plan: Diagnosis: LE weakness Labs and images independently reviewed.  Records reviewed and summated above.  1. Does the need for close, 24 hr/day medical supervision in concert with the patient's  rehab needs make it unreasonable for this patient to be served in a less intensive setting? Potentially 2. Co-Morbidities requiring supervision/potential complications: CVA 1610, uncontrolled type 1 diabetes mellitus with neuropathy (Monitor in accordance with exercise and adjust meds as necessary), DKA, fibromyalgia, HTN with HTN urgency (monitor and provide prns in accordance with increased physical exertion and pain), AKI (avoid nephrotoxic meds)  3. Due to bladder management, safety and skin/wound care, does the patient require 24 hr/day rehab nursing? Potentially 4. Does the patient require coordinated care of a physician, rehab nurse, PT (1-2 hrs/day, 5 days/week) and OT (1-2 hrs/day, 5 days/week) to address physical and functional deficits in the context of the above medical diagnosis(es)? Yes Addressing deficits in the following areas: balance, endurance, locomotion, strength, transferring, bowel/bladder control, bathing, dressing, toileting and psychosocial support 5. Can the patient actively participate in an intensive therapy program of at least 3 hrs of therapy per day at least 5 days per week? Potentially 6. The potential for patient to make measurable gains while on inpatient rehab is excellent 7. Anticipated functional outcomes upon discharge from inpatient rehab are supervision and min assist  with PT, supervision and min assist with OT, n/a with SLP. 8. Estimated rehab length of stay to reach the above functional goals is: 17-20 days. 9. Anticipated D/C setting: Other 10. Anticipated post D/C treatments: SNF 11. Overall Rehab/Functional Prognosis: good  RECOMMENDATIONS: This patient's condition is appropriate for continued rehabilitative care in the following setting: CIR if able to tolerate therapies when medically stable and workup complete. Patient has agreed to participate in recommended program. Yes Note that insurance prior authorization may be required for reimbursement for  recommended care.  Comment: Rehab Admissions Coordinator to follow up.  Delice Lesch, MD, ABPMR Cathlyn Parsons., PA-C 11/20/2016    Revision History  Routing History

## 2016-11-21 NOTE — Progress Notes (Addendum)
Patient Details  Name: LORE LOPEZ MRN: [id number] Date of Birth: 13-Sep-1965  Subjective/Objective:     From home with boyfriend, pta indep, she presents with DKA , noncompliance, neuropathy, fibromyalgia, patient states she has been using insulin in her refrigerator that is 49 years old, NCM informed her that, the insulin has expired and is not good to use. She goes to internal medicine for follow up. She [facility]                    Action/Plan:  Plan is to d/c to CIR when medically stable. CM following for disposition needs.  Expected Discharge Date:11/21/2016                    Expected Discharge Plan:  Home/Self Care  In-House Referral:     Discharge planning Services  CM Consult, Indigent Health Clinic, Medication Assistance  Post Acute Care Choice:    Choice offered to:     DME Arranged:    DME Agency:     HH Arranged:    HH Agency:     Status of Service:  In process, will continue to follow  If discussed at Long Length of Stay Meetings, dates discussed:  9/25,  Additional Comments:

## 2016-11-22 ENCOUNTER — Inpatient Hospital Stay (HOSPITAL_COMMUNITY)
Admission: RE | Admit: 2016-11-22 | Discharge: 2016-12-15 | DRG: 556 | Disposition: A | Discharge status: Home Health | Payer: Self-pay | Source: Intra-hospital | Attending: Physical Medicine & Rehabilitation | Admitting: Physical Medicine & Rehabilitation

## 2016-11-22 ENCOUNTER — Encounter (HOSPITAL_COMMUNITY): Payer: Self-pay

## 2016-11-22 DIAGNOSIS — E1165 Type 2 diabetes mellitus with hyperglycemia: Secondary | ICD-10-CM

## 2016-11-22 DIAGNOSIS — G543 Thoracic root disorders, not elsewhere classified: Secondary | ICD-10-CM

## 2016-11-22 DIAGNOSIS — R339 Retention of urine, unspecified: Secondary | ICD-10-CM | POA: Diagnosis present

## 2016-11-22 DIAGNOSIS — E782 Mixed hyperlipidemia: Secondary | ICD-10-CM

## 2016-11-22 DIAGNOSIS — Z794 Long term (current) use of insulin: Secondary | ICD-10-CM

## 2016-11-22 DIAGNOSIS — Z880 Allergy status to penicillin: Secondary | ICD-10-CM

## 2016-11-22 DIAGNOSIS — R7309 Other abnormal glucose: Secondary | ICD-10-CM

## 2016-11-22 DIAGNOSIS — E1142 Type 2 diabetes mellitus with diabetic polyneuropathy: Secondary | ICD-10-CM

## 2016-11-22 DIAGNOSIS — Z8249 Family history of ischemic heart disease and other diseases of the circulatory system: Secondary | ICD-10-CM

## 2016-11-22 DIAGNOSIS — Z79899 Other long term (current) drug therapy: Secondary | ICD-10-CM

## 2016-11-22 DIAGNOSIS — Z9181 History of falling: Secondary | ICD-10-CM

## 2016-11-22 DIAGNOSIS — N390 Urinary tract infection, site not specified: Secondary | ICD-10-CM | POA: Diagnosis present

## 2016-11-22 DIAGNOSIS — N319 Neuromuscular dysfunction of bladder, unspecified: Secondary | ICD-10-CM | POA: Diagnosis present

## 2016-11-22 DIAGNOSIS — G479 Sleep disorder, unspecified: Secondary | ICD-10-CM

## 2016-11-22 DIAGNOSIS — Z8673 Personal history of transient ischemic attack (TIA), and cerebral infarction without residual deficits: Secondary | ICD-10-CM

## 2016-11-22 DIAGNOSIS — G35 Multiple sclerosis: Principal | ICD-10-CM

## 2016-11-22 DIAGNOSIS — I248 Other forms of acute ischemic heart disease: Secondary | ICD-10-CM | POA: Diagnosis present

## 2016-11-22 DIAGNOSIS — R0989 Other specified symptoms and signs involving the circulatory and respiratory systems: Secondary | ICD-10-CM

## 2016-11-22 DIAGNOSIS — E1042 Type 1 diabetes mellitus with diabetic polyneuropathy: Secondary | ICD-10-CM | POA: Diagnosis present

## 2016-11-22 DIAGNOSIS — M797 Fibromyalgia: Secondary | ICD-10-CM | POA: Diagnosis present

## 2016-11-22 DIAGNOSIS — I160 Hypertensive urgency: Secondary | ICD-10-CM | POA: Diagnosis not present

## 2016-11-22 DIAGNOSIS — M545 Low back pain, unspecified: Secondary | ICD-10-CM

## 2016-11-22 DIAGNOSIS — G2581 Restless legs syndrome: Secondary | ICD-10-CM | POA: Diagnosis present

## 2016-11-22 DIAGNOSIS — K219 Gastro-esophageal reflux disease without esophagitis: Secondary | ICD-10-CM | POA: Diagnosis present

## 2016-11-22 DIAGNOSIS — Z833 Family history of diabetes mellitus: Secondary | ICD-10-CM

## 2016-11-22 DIAGNOSIS — Z7982 Long term (current) use of aspirin: Secondary | ICD-10-CM

## 2016-11-22 DIAGNOSIS — D72829 Elevated white blood cell count, unspecified: Secondary | ICD-10-CM

## 2016-11-22 DIAGNOSIS — M549 Dorsalgia, unspecified: Secondary | ICD-10-CM | POA: Diagnosis present

## 2016-11-22 DIAGNOSIS — E10649 Type 1 diabetes mellitus with hypoglycemia without coma: Secondary | ICD-10-CM | POA: Diagnosis not present

## 2016-11-22 DIAGNOSIS — Z9119 Patient's noncompliance with other medical treatment and regimen: Secondary | ICD-10-CM

## 2016-11-22 DIAGNOSIS — I10 Essential (primary) hypertension: Secondary | ICD-10-CM | POA: Diagnosis present

## 2016-11-22 DIAGNOSIS — D62 Acute posthemorrhagic anemia: Secondary | ICD-10-CM | POA: Diagnosis present

## 2016-11-22 DIAGNOSIS — M792 Neuralgia and neuritis, unspecified: Secondary | ICD-10-CM

## 2016-11-22 DIAGNOSIS — J45909 Unspecified asthma, uncomplicated: Secondary | ICD-10-CM | POA: Diagnosis present

## 2016-11-22 DIAGNOSIS — W19XXXD Unspecified fall, subsequent encounter: Secondary | ICD-10-CM | POA: Diagnosis present

## 2016-11-22 DIAGNOSIS — R262 Difficulty in walking, not elsewhere classified: Principal | ICD-10-CM | POA: Diagnosis present

## 2016-11-22 DIAGNOSIS — E785 Hyperlipidemia, unspecified: Secondary | ICD-10-CM | POA: Diagnosis present

## 2016-11-22 DIAGNOSIS — Z9109 Other allergy status, other than to drugs and biological substances: Secondary | ICD-10-CM

## 2016-11-22 DIAGNOSIS — N179 Acute kidney failure, unspecified: Secondary | ICD-10-CM | POA: Diagnosis present

## 2016-11-22 DIAGNOSIS — Z91048 Other nonmedicinal substance allergy status: Secondary | ICD-10-CM

## 2016-11-22 DIAGNOSIS — Z7409 Other reduced mobility: Secondary | ICD-10-CM | POA: Diagnosis present

## 2016-11-22 DIAGNOSIS — R296 Repeated falls: Secondary | ICD-10-CM

## 2016-11-22 DIAGNOSIS — E1039 Type 1 diabetes mellitus with other diabetic ophthalmic complication: Secondary | ICD-10-CM

## 2016-11-22 LAB — BASIC METABOLIC PANEL
ANION GAP: 8 (ref 5–15)
BUN: 20 mg/dL (ref 6–20)
CHLORIDE: 104 mmol/L (ref 101–111)
CO2: 25 mmol/L (ref 22–32)
Calcium: 8.7 mg/dL — ABNORMAL LOW (ref 8.9–10.3)
Creatinine, Ser: 1.27 mg/dL — ABNORMAL HIGH (ref 0.44–1.00)
GFR calc non Af Amer: 49 mL/min — ABNORMAL LOW (ref >60)
GFR, EST AFRICAN AMERICAN: 56 mL/min — AB (ref >60)
GLUCOSE: 122 mg/dL — AB (ref 65–99)
POTASSIUM: 4.1 mmol/L (ref 3.5–5.1)
Sodium: 137 mmol/L (ref 135–145)

## 2016-11-22 LAB — COMPREHENSIVE METABOLIC PANEL
ALT: 87 U/L — ABNORMAL HIGH (ref 14–54)
AST: 77 U/L — ABNORMAL HIGH (ref 15–41)
Albumin: 3 g/dL — ABNORMAL LOW (ref 3.5–5.0)
Alkaline Phosphatase: 107 U/L (ref 38–126)
Anion gap: 11 (ref 5–15)
BUN: 28 mg/dL — AB (ref 6–20)
CHLORIDE: 97 mmol/L — AB (ref 101–111)
CO2: 24 mmol/L (ref 22–32)
Calcium: 9.3 mg/dL (ref 8.9–10.3)
Creatinine, Ser: 1.28 mg/dL — ABNORMAL HIGH (ref 0.44–1.00)
GFR calc Af Amer: 56 mL/min — ABNORMAL LOW (ref >60)
GFR calc non Af Amer: 48 mL/min — ABNORMAL LOW (ref >60)
GLUCOSE: 284 mg/dL — AB (ref 65–99)
POTASSIUM: 4.7 mmol/L (ref 3.5–5.1)
SODIUM: 132 mmol/L — AB (ref 135–145)
Total Bilirubin: 0.5 mg/dL (ref 0.3–1.2)
Total Protein: 6.5 g/dL (ref 6.5–8.1)

## 2016-11-22 LAB — CBC WITH DIFFERENTIAL/PLATELET
Basophils Absolute: 0 10*3/uL (ref 0.0–0.1)
Basophils Relative: 0 %
EOS ABS: 0 10*3/uL (ref 0.0–0.7)
EOS PCT: 0 %
HCT: 39.3 % (ref 36.0–46.0)
Hemoglobin: 13 g/dL (ref 12.0–15.0)
LYMPHS ABS: 1 10*3/uL (ref 0.7–4.0)
Lymphocytes Relative: 6 %
MCH: 28.9 pg (ref 26.0–34.0)
MCHC: 33.1 g/dL (ref 30.0–36.0)
MCV: 87.3 fL (ref 78.0–100.0)
MONO ABS: 0.3 10*3/uL (ref 0.1–1.0)
MONOS PCT: 2 %
Neutro Abs: 15.4 10*3/uL — ABNORMAL HIGH (ref 1.7–7.7)
Neutrophils Relative %: 92 %
PLATELETS: 303 10*3/uL (ref 150–400)
RBC: 4.5 MIL/uL (ref 3.87–5.11)
RDW: 14.3 % (ref 11.5–15.5)
WBC: 16.7 10*3/uL — ABNORMAL HIGH (ref 4.0–10.5)

## 2016-11-22 LAB — GLUCOSE, CAPILLARY
GLUCOSE-CAPILLARY: 137 mg/dL — AB (ref 65–99)
GLUCOSE-CAPILLARY: 239 mg/dL — AB (ref 65–99)
GLUCOSE-CAPILLARY: 341 mg/dL — AB (ref 65–99)
Glucose-Capillary: 144 mg/dL — ABNORMAL HIGH (ref 65–99)
Glucose-Capillary: 296 mg/dL — ABNORMAL HIGH (ref 65–99)
Glucose-Capillary: 555 mg/dL (ref 65–99)

## 2016-11-22 MED ORDER — INSULIN ASPART 100 UNIT/ML ~~LOC~~ SOLN: 0.0000 [IU] | Freq: Three times a day (TID) | SUBCUTANEOUS | Status: DC

## 2016-11-22 MED ORDER — SODIUM CHLORIDE 0.9 % IV SOLN
500.0000 mg | Freq: Two times a day (BID) | INTRAVENOUS | Status: AC
  Administered 2016-11-22 – 2016-11-24 (×4): 500 mg via INTRAVENOUS
  Filled 2016-11-22 (×4): qty 4

## 2016-11-22 MED ORDER — DICLOFENAC SODIUM 1 % TD GEL
2.0000 g | Freq: Two times a day (BID) | TRANSDERMAL | Status: DC
  Administered 2016-11-22 – 2016-12-14 (×27): 2 g via TOPICAL
  Filled 2016-11-22: qty 100

## 2016-11-22 MED ORDER — INSULIN ASPART 100 UNIT/ML ~~LOC~~ SOLN
8.0000 [IU] | Freq: Three times a day (TID) | SUBCUTANEOUS | Status: DC
Start: 2016-11-22 — End: 2016-11-22
  Administered 2016-11-22 (×2): 8 [IU] via SUBCUTANEOUS

## 2016-11-22 MED ORDER — GABAPENTIN 300 MG PO CAPS
600.0000 mg | ORAL_CAPSULE | Freq: Three times a day (TID) | ORAL | Status: DC
  Administered 2016-11-22 – 2016-12-15 (×69): 600 mg via ORAL
  Filled 2016-11-22 (×71): qty 2

## 2016-11-22 MED ORDER — LISINOPRIL 40 MG PO TABS
40.0000 mg | ORAL_TABLET | Freq: Every day | ORAL | Status: DC
  Administered 2016-11-23 – 2016-11-28 (×6): 40 mg via ORAL
  Filled 2016-11-22 (×6): qty 1

## 2016-11-22 MED ORDER — PROMETHAZINE HCL 12.5 MG PO TABS: 12.5000 mg | ORAL_TABLET | Freq: Four times a day (QID) | ORAL | 0 refills | Status: DC | PRN

## 2016-11-22 MED ORDER — HEPARIN SODIUM (PORCINE) 5000 UNIT/ML IJ SOLN
5000.0000 [IU] | Freq: Three times a day (TID) | INTRAMUSCULAR | Status: DC
  Administered 2016-11-22 – 2016-12-15 (×69): 5000 [IU] via SUBCUTANEOUS
  Filled 2016-11-22 (×70): qty 1

## 2016-11-22 MED ORDER — SODIUM CHLORIDE 0.9 % IV SOLN
500.0000 mg | Freq: Two times a day (BID) | INTRAVENOUS | Status: DC
  Administered 2016-11-22 (×2): 500 mg via INTRAVENOUS
  Filled 2016-11-22 (×3): qty 4

## 2016-11-22 MED ORDER — GLUCERNA SHAKE PO LIQD
237.0000 mL | Freq: Three times a day (TID) | ORAL | Status: DC
  Administered 2016-11-22 – 2016-11-27 (×7): 237 mL via ORAL

## 2016-11-22 MED ORDER — PANTOPRAZOLE SODIUM 40 MG IV SOLR
40.0000 mg | INTRAVENOUS | Status: DC
  Administered 2016-11-22: 40 mg via INTRAVENOUS
  Filled 2016-11-22: qty 40

## 2016-11-22 MED ORDER — ONDANSETRON HCL 4 MG/2ML IJ SOLN
4.0000 mg | Freq: Four times a day (QID) | INTRAMUSCULAR | Status: DC | PRN
Start: 2016-11-22 — End: 2016-11-28
  Administered 2016-11-27: 4 mg via INTRAVENOUS
  Filled 2016-11-22: qty 2

## 2016-11-22 MED ORDER — AMLODIPINE BESYLATE 10 MG PO TABS
10.0000 mg | ORAL_TABLET | Freq: Every day | ORAL | Status: DC
  Administered 2016-11-23 – 2016-12-15 (×23): 10 mg via ORAL
  Filled 2016-11-22 (×23): qty 1

## 2016-11-22 MED ORDER — INSULIN ASPART 100 UNIT/ML ~~LOC~~ SOLN
0.0000 [IU] | SUBCUTANEOUS | Status: DC
  Administered 2016-11-22: 2 [IU] via SUBCUTANEOUS
  Administered 2016-11-22: 8 [IU] via SUBCUTANEOUS
  Administered 2016-11-22: 2 [IU] via SUBCUTANEOUS

## 2016-11-22 MED ORDER — OXYCODONE HCL 5 MG PO TABS
10.0000 mg | ORAL_TABLET | ORAL | Status: DC | PRN
  Administered 2016-11-22 – 2016-12-15 (×48): 10 mg via ORAL
  Filled 2016-11-22 (×50): qty 2

## 2016-11-22 MED ORDER — INSULIN ASPART 100 UNIT/ML ~~LOC~~ SOLN
8.0000 [IU] | Freq: Three times a day (TID) | SUBCUTANEOUS | Status: DC
  Administered 2016-11-23 (×3): 8 [IU] via SUBCUTANEOUS

## 2016-11-22 MED ORDER — ACETAMINOPHEN 650 MG RE SUPP: 650.0000 mg | Freq: Four times a day (QID) | RECTAL | Status: DC | PRN

## 2016-11-22 MED ORDER — INSULIN ASPART 100 UNIT/ML ~~LOC~~ SOLN
0.0000 [IU] | SUBCUTANEOUS | Status: DC
  Administered 2016-11-22 – 2016-11-23 (×4): 15 [IU] via SUBCUTANEOUS

## 2016-11-22 MED ORDER — ONDANSETRON HCL 4 MG PO TABS: 4.0000 mg | ORAL_TABLET | Freq: Four times a day (QID) | ORAL | Status: DC | PRN

## 2016-11-22 MED ORDER — SORBITOL 70 % SOLN: 30.0000 mL | Freq: Every day | Status: DC | PRN

## 2016-11-22 MED ORDER — METOPROLOL SUCCINATE ER 50 MG PO TB24
50.0000 mg | ORAL_TABLET | Freq: Every day | ORAL | Status: DC
  Administered 2016-11-23 – 2016-11-25 (×3): 50 mg via ORAL
  Filled 2016-11-22 (×3): qty 1

## 2016-11-22 MED ORDER — ENSURE ENLIVE PO LIQD
237.0000 mL | Freq: Two times a day (BID) | ORAL | Status: DC
  Administered 2016-11-23 (×2): 237 mL via ORAL

## 2016-11-22 MED ORDER — PANTOPRAZOLE SODIUM 40 MG IV SOLR: 40.0000 mg | INTRAVENOUS | 0 refills | Status: DC

## 2016-11-22 MED ORDER — ACETAMINOPHEN 325 MG PO TABS
650.0000 mg | ORAL_TABLET | Freq: Four times a day (QID) | ORAL | Status: DC | PRN
  Administered 2016-11-30 – 2016-12-13 (×2): 650 mg via ORAL
  Filled 2016-11-22 (×2): qty 2

## 2016-11-22 MED ORDER — GABAPENTIN 300 MG PO CAPS: 600.0000 mg | ORAL_CAPSULE | Freq: Three times a day (TID) | ORAL | 0 refills | Status: DC

## 2016-11-22 MED ORDER — PANTOPRAZOLE SODIUM 40 MG IV SOLR
40.0000 mg | INTRAVENOUS | Status: DC
  Administered 2016-11-23 – 2016-11-24 (×2): 40 mg via INTRAVENOUS
  Filled 2016-11-22 (×3): qty 40

## 2016-11-22 MED ORDER — SODIUM CHLORIDE 0.9 % IV SOLN: 500.0000 mg | Freq: Two times a day (BID) | INTRAVENOUS | 0 refills | Status: DC

## 2016-11-22 MED ORDER — INSULIN GLARGINE 100 UNIT/ML ~~LOC~~ SOLN
28.0000 [IU] | Freq: Every day | SUBCUTANEOUS | Status: DC
  Administered 2016-11-23: 28 [IU] via SUBCUTANEOUS
  Filled 2016-11-22: qty 0.28

## 2016-11-22 MED ORDER — ATORVASTATIN CALCIUM 40 MG PO TABS
40.0000 mg | ORAL_TABLET | Freq: Every day | ORAL | Status: DC
  Administered 2016-11-23 – 2016-12-09 (×17): 40 mg via ORAL
  Filled 2016-11-22 (×17): qty 1

## 2016-11-22 NOTE — Progress Notes (Signed)
Interm note  I was called by internal medicine she was taking care the patient  Regarding MRI findiMs. Debra [name] is a 51 year old female who presents w lower extremity weakness, urinary incontinence and pain. MRI T spine demonstrated a T10 lesion rasing concern for demyelinating disease.  MRI brain and MRI C-spine was performed tonight. MRI Brain  which demonstrated multiple subcortical and periventricular white matter lesions, some of them with restriction diffussion indicative of active demyelinating disease and diagnosis of MS. Multiple enhancing lesions noted in the C-spine as well most  prominent C1-C2 as well as C5-6 noted. On reviewing history and physical examination noted by  Dr Amada Jupiter and Roseanne Reno, the clinical and radiological picture  consistent with the diagnosis of relapsing remitting multiple sclerosis. Recommend treating with 500mg   IV BID  Solumedrol x 3-5 days and an outpatient follow-up with MS specialist for starting  disease modifying agent. The patient has poorly controlled diabetes mellitus and is at high risk for going into diabetes ketoacidosis while receiving IV steroid therapy and advised close monitoring of blood glucose.

## 2016-11-22 NOTE — Progress Notes (Signed)
   Subjective:  Foley placed overnight. Patient continues to experience lower extremity pain but stable on current pain regimen. She continues to have nausea during PO intake but improvement. Has continued to ambulate in the hallway with the help of a walker. Patient states that neurology spoke to her yesterday and explained that she has a new diagnosis of multiple sclerosis. She seem to have a good understanding of MS, but had several questions regarding her diagnosis and asked for information about it. All questions answered and information will be provided on discharge.  Objective:  Vital signs in last 24 hours: Vitals:   11/21/16 0854 11/21/16 1500 11/21/16 2145 11/22/16 0445  BP: (!) 156/87 (!) 160/71 136/61 (!) 178/87  Pulse:  79 78 72  Resp:  19 18 18   Temp:  97.9 F (36.6 C) 98.2 F (36.8 C) 98.8 F (37.1 C)  TempSrc:  Oral Oral Oral  SpO2:  100% 99% 97%  Weight:      Height:       General: pleasant female, well-nourished, well-developed, sitting up in chair in no acute distress Cardiac: regular rate and rhythm, nl S1/S2, no murmurs, rubs or gallops  Pulm: CTAB, no wheezes or crackles, no increased work of breathing  Abd: soft, NTND, bowel sounds present  Neuro: A&Ox3, patient is now able to elevate LLE 45 degrees and RLE 30 degrees, continues to have good plantar and dorsiflexion Ext: warm and well perfused, no peripheral edema   Assessment/Plan:  Debra [name] is a 51 y.o. female with a history for T1DM complicated by peripheral  Neuropathy who presented to the ED with multiple complaints including AMS, abdominal pain, N/V, diarrhea, and 2 week history of progressive weakness and pain of her lower extremities.   # Multiple sclerosis: Patient presented with bilateral lower extremity weakness and pain. MRI of brain and C-spine revealed several active lesions consistent with multiple sclerosis. Neurology aware and started patient on IV Solu-Medrol 500 MG twice a day. No LP as  neurology believes lesions on MRI are diagnostic of a MS. They have discussed the diagnosis with the patient.  - Continue IV Solu-Medrol 500 MG twice a day - Continue IV toradol 30 mg q6 hours and gabapentin 600 mg TID  - K-pad and voltaren gel per patient's request  - Anticipate discharged to CIR today  # Urinary retention: patient require placement of Foley overnight  - Will continueFoley catheter   # DKA: Resolved  # T1DM, uncontrolled  - SQ Lantus 25U QHS + Novolog 6U TID with meals  - SSI-S   # HTN: remains hypertensive during admission with sBP 140-160s  - Continue home amlodipine 10 mg QD + lisinopril 40 mg QD   - Continue metoprolol to 50 mg daily   F: None  E:  Will continue to monitor and replete as needed  N: CM + supplements   VTE ppx: SQ heparin   Code status: Full code, not confirmed   Dispo: Anticipated discharge to CIR today.   [name], MD  Internal Medicine PGY-1  P [phone]

## 2016-11-22 NOTE — Progress Notes (Addendum)
Inpatient Rehabilitation  Note updates from last night's medical work up and plan for IV Solumedrol x3-5 days, which started today, 11/22/16.  Patient needs to be off IV Solumedrol and demonstrating continued therapy needs. Plan to continue to follow for timing of medical readiness and IP Rehab bed availability.  Please call with questions.   Update: I have reviewed case with my team and are able to offer an IP Rehab bed during her IV Solumedrol treatments to allow for intensive therapies and close glucose monitoring.  I have reached out to acute MD.  Plan to update team when I know.      Charlane Ferretti., CCC/SLP Admission Coordinator  [facility] Inpatient Rehabilitation  Cell [phone]

## 2016-11-22 NOTE — Progress Notes (Signed)
Subjective: No significant change overnight  Exam: Vitals:   11/22/16 0445 11/22/16 0600  BP: (!) 178/87 (!) 147/80  Pulse: 72 75  Resp: 18   Temp: 98.8 F (37.1 C)   SpO2: 97%     HEENT-  Normocephalic, no lesions, without obvious abnormality.  Normal external eye and conjunctiva.  Normal TM's bilaterally.  Normal auditory canals and external ears. Normal external nose, mucus membranes and septum.  Normal pharynx.    Neuro:  CN: Pupils are equal and round. They are symmetrically reactive from 3-->2 mm. EOMI without nystagmus. Facial sensation is intact to light touch. Face is symmetric at rest with normal strength and mobility. Hearing is intact to conversational voice. Palate elevates symmetrically and uvula is midline. Voice is normal in tone, pitch and quality. Bilateral SCM and trapezii are 5/5. Tongue is midline with normal bulk and mobility.  Motor: Right :   Upper extremity   5/5                                      Left:     Upper extremity   5/5               Lower extremity   5/5                                                  Lower extremity   5/5 --dorsiflexion/extension 5/5, Knee flexion 4/5, knee extension 5/5 but limited by pain on the right, hip flexion 4/5 but limited by pain, hip abduction 5/5 and adduction 0/5.   Sensation: stated to be decreased from umbilicus down and in her perineal region  .  DTRs: 2+ in the upper extremities with no lower extremity reflexes      Pertinent Labs/Diagnostics: MRI of brain and cervical spine results: MRI HEAD IMPRESSION:  1. Multifocal cerebral white matter changes in a distribution most consistent with demyelinating disease, multiple sclerosis. Associated restricted diffusion with patchy enhancement about several of these lesions consistent with active demyelination. 2. No other acute intracranial abnormality. 3. Acute pan sinusitis.  MRI CERVICAL SPINE IMPRESSION:  1. Patchy cord signal abnormality extending from  the craniocervical junction through the upper thoracic spine, consistent with demyelinating disease. Evidence for active demyelination at the level of C1-2 and C6 as above. 2. Mild multilevel degenerative disc bulging at C3-4 through C6-7 without significant stenosis.   Felicie Morn PA-C [facility] [phone]  I have seen the patient and reviewed the above note.  Impression: 51 year old female with likely multiple sclerosis.  After discussion, it sounds like she has a history of an episode that could be consistent with optic neuritis. Also, the presence of the numbness below the thoracic level for so long I think would differentiate it from the current episode. With multiple enhancing and nonenhancing lesions on MRI, I feel that this is consistent enough with multiple sclerosis that I do not feel that lumbar puncture is necessary at this time.   Recommendations: 1) Continue Solu-Medrol 500 mg IV twice a day for 3-5 days. 2) Protonix 40 mg IV daily 3) physical therapy  4) Will need to follow-up with neurology after discharge. This has been requested   Ritta Slot, MD [facility] [phone]  If 7pm- 7am, please  page neurology on call as listed in [facility].   11/22/2016, 10:02 AM

## 2016-11-22 NOTE — Care Management Note (Addendum)
Case Management Note  Patient Details  Name: Debra [name] MRN: [id number] Date of Birth: 03/20/1965  Subjective/Objective:              From home with boyfriend, pta indep, she presents with DKA , noncompliance, neuropathy, fibromyalgia, patient states she has been using insulin in her refrigerator that is 49 years old, NCM informed her that, the insulin has expired and is not good to use. She goes to internal medicine for follow up. [facility]   PCP: Nyra Market  Action/Plan: Plan is to d/c to CIR today.  Expected Discharge Date:    11/22/2016          Expected Discharge Plan:  Inpatient Rehab.  In-House Referral:     Discharge planning Services    Status of Service:  Completed, signed off  If discussed at Long Length of Stay Meetings, dates discussed:    Additional Comments:  Epifanio Lesches, RN 11/22/2016, 11:26 AM

## 2016-11-22 NOTE — Progress Notes (Signed)
  Date: 11/22/2016  Patient name: Debra [name]  Medical record number: [id number]  Date of birth: Mar 30, 1965   I have seen and evaluated this patient and I have discussed the plan of care with the house staff. Please see their note for complete details. I concur with their findings with the following additions/corrections:  Ms  was seen on morning round. Neuro has made a dx of MS clinically and radiographically and pt on IV solumedrol.  She has a CIR bed offer and is stable for transfer.  Burns Spain, MD 11/22/2016, 3:21 PM

## 2016-11-22 NOTE — Discharge Summary (Signed)
Name: Debra [name] MRN: [id number] DOB: 20-Feb-1966 51 y.o. PCP: Nyra Market, MD  Date of Admission: 11/15/2016 12:10 PM Date of Discharge: 11/22/2016 Attending Physician: Burns Spain, MD  Discharge Diagnosis: 1. DKA 2. Multiple Sclerosis   Principal Problem:   Multiple sclerosis (HCC) Active Problems:   Leg weakness, bilateral   Diabetic ketoacidosis without coma associated with type 1 diabetes mellitus (HCC)   Leg pain   History of CVA (cerebrovascular accident)   Uncontrolled type 1 diabetes mellitus with diabetic peripheral neuropathy (HCC)   Benign essential HTN   Hypertensive urgency   AKI (acute kidney injury) (HCC)   Discharge Medications: Allergies as of 11/22/2016      Reactions   Penicillins Anaphylaxis, Nausea And Vomiting, Rash   Has patient had a PCN reaction causing immediate rash, facial/tongue/throat swelling, SOB or lightheadedness with hypotension: Yes Has patient had a PCN reaction causing severe rash involving mucus membranes or skin necrosis: Yes Has patient had a PCN reaction that required hospitalization No Has patient had a PCN reaction occurring within the last 10 years: Yes If all of the above answers are "NO", then may proceed with Cephalosporin use.   Pollen Extract    Seasonal Allergies   Tape Rash      Medication List    TAKE these medications   AGAMATRIX PRESTO PRO METER Devi The patient is insulin requiring, ICD 10 code E10.9. The patient tests 4 times per day.   albuterol 108 (90 Base) MCG/ACT inhaler Commonly known as:  PROVENTIL HFA;VENTOLIN HFA Inhale 1-2 puffs into the lungs every 6 (six) hours as needed for wheezing or shortness of breath.   amLODipine 10 MG tablet Commonly known as:  NORVASC Take 1 tablet (10 mg total) by mouth daily.   aspirin EC 81 MG tablet Take 1 tablet (81 mg total) by mouth daily.   atorvastatin 40 MG tablet Commonly known as:  LIPITOR Take 1 tablet (40 mg total) by mouth daily.     gabapentin 300 MG capsule Commonly known as:  NEURONTIN Take 2 capsules (600 mg total) by mouth 3 (three) times daily. What changed:  how much to take  when to take this  additional instructions   glucose blood test strip Commonly known as:  AGAMATRIX AMP TEST Use as instructed   insulin aspart 100 UNIT/ML injection Commonly known as:  NOVOLOG Inject 10 Units into the skin 3 (three) times daily before meals.   insulin glargine 100 UNIT/ML injection Commonly known as:  LANTUS Inject 0.33 mLs (33 Units total) into the skin at bedtime.   Insulin Pen Needle 32G X 4 MM Misc Use to inject insulin twice daily   lisinopril-hydrochlorothiazide 20-12.5 MG tablet Commonly known as:  PRINZIDE,ZESTORETIC Take 2 tablets by mouth daily.   Meloxicam 10 MG Caps Take 15 mg by mouth every morning.   methylPREDNISolone sodium succinate 500 mg in sodium chloride 0.9 % 50 mL Inject 500 mg into the vein 2 (two) times daily.   metoprolol succinate 100 MG 24 hr tablet Commonly known as:  TOPROL XL Take 1 tablet (100 mg total) by mouth daily. Take with or immediately following a meal.   nitroGLYCERIN 0.4 MG SL tablet Commonly known as:  NITROSTAT Place 1 tablet (0.4 mg total) under the tongue every 5 (five) minutes as needed for chest pain.   pantoprazole 40 MG injection Commonly known as:  PROTONIX Inject 40 mg into the vein daily.   promethazine 12.5 MG tablet Commonly known  as:  PHENERGAN Take 1 tablet (12.5 mg total) by mouth every 6 (six) hours as needed for nausea.            Discharge Care Instructions        Start     Ordered   11/22/16 0000  Ambulatory referral to Neurology    Comments:  An appointment is requested in approximately: 2 weeks-3 weeks for newly diagnosed MS   11/22/16 1009   11/22/16 0000  Increase activity slowly     11/22/16 1157   11/22/16 0000  Diet Carb Modified     11/22/16 1157   11/22/16 0000  Call MD for:  extreme fatigue     11/22/16 1157    11/22/16 0000  Call MD for:  severe uncontrolled pain     11/22/16 1157   11/22/16 0000  Call MD for:  difficulty breathing, headache or visual disturbances     11/22/16 1157      Disposition and follow-up:   Debra [name] was discharged from [facility] in Stable condition.  At the hospital follow up visit please address:  1. Patient was started on IV solumedrol for MS flare. Please follow up blood glucose closely and make appropriate adjustments in insulin as patient initially presented in DKA.   2. Please assess patient's pain control. She will also need monitoring of BP as she remained hypertensive despite restarting most of her home BP medications.   3.  Labs / imaging needed at time of follow-up: None  4.  Pending labs/ test needing follow-up: None   Follow-up Appointments: Patient was discharged to CIR and will continue to receive care at facility. Neurology will continue to follow patient.   Hospital Course by problem list:   Debra [name] is a 51 y.o. female with a history for T1DM complicated by peripheral neuropathy who presented to the ED with multiple complaints including AMS, abdominal pain, N/V, diarrhea, and 2 week history of progressive weakness and pain of her lower extremities.   1. Multiple sclerosis:Patient presented with acute onset of bilateral lower extremity weakness and pain and frequent falls for 2 weeks. This was associated with acute urinary rentention and fecal incontinence (chronic in nature). Initial thoracic and lumbar MRI ruled out cauda equina, but showed a T10 lesion that was found to be non-enhancing on repeat MRI with contrast. Neurology was consulted and recommended PT. Patient was not candidate for steroid therapy or surgery  as lesion was nonenhancing, chronic in nature, and no mass effect was observed. Patient continued to be non-ambulatory and required placement of foley catheter. Neurology re-consulted and recommended MRI  of brain and C-spine which revealed several active and non-active lesions in both the brain and C-spine consistent with multiple sclerosis. She was started on IV Solu-Medrol 500 MG BID for 3-5 days per neurology recommendations. A lumbar puncture was not performed as neurology believed history and  lesions on MRI were diagnostic of a MS. Neurology will continue to follow up patient during her stay in CIR. She was discharged with foley in place.    2. DKA: Patient has a history of uncontrolled T1DM and presented in DKA secondary to use of expired insulin due to affordability issued. She was managed with insulin drip and aggressive IVF hydration. She was then transitioned to SQ Lantus and was able to tolerate PO intake. She was discharged on Lantus 28U and will require increase in dosage. She will also required close monitoring of blood glucose  as she was started on IV solumedrol for MS flare.   3. HTN: Home antihypertensive medications held during admission due to volume contraction and DKA in the setting of AKI. Once patient was hydrated with IVF, BP meds started. However, she remained hypertensive during this admission with sBP 160-170s. She was amlodipine 10 mg QD,  lisinopril 40 mg QD, and metoprolol to 50 mg QD. Home HCTZ not restarted.     Discharge Vitals:   BP (!) 165/69 (BP Location: Right Arm)   Pulse 77   Temp 98.4 F (36.9 C) (Oral)   Resp 16   Ht 5\' 4"  (1.626 m)   Wt 167 lb 15.9 oz (76.2 kg)   SpO2 97%   BMI 28.84 kg/m   Pertinent Labs, Studies, and Procedures:   MRI brain: IMPRESSION: 1. Multifocal cerebral white matter changes in a distribution most consistent with demyelinating disease, multiple sclerosis.Associated restricted diffusion with patchy enhancement about several of these lesions consistent with active demyelination. 2. No other acute intracranial abnormality. 3. Acute pan sinusitis.   MRI C-spine: IMPRESSION: 1. Patchy cord signal abnormality extending from  the craniocervical junction through the upper thoracic spine, consistent with demyelinating disease. Evidence for active demyelination at the level of C1-2 and C6 as above. 2. Mild multilevel degenerative disc bulging at C3-4 through C6-7 without significant stenosis.   MRI thoracic: IMPRESSION: Ill-defined lesion in the spinal cord at the T10 level unchanged from the prior study. This measures approximately 3 x 13 mm and does not enhance. This could represent a small syrinx however I think it more likely is represents cord lesion. This may represent an area of transverse myelitis or chronic myelomalacia. Remainder of the cord normal. Remainder of the spine normal.   MRI lumbar: IMPRESSION 1. Re- demonstration of paraspinal muscle edema seen with myositis/rhabdomyolysis (possibly secondary to DKA), or strain. No focal fluid collection. 2. Mild degenerative change of the lumbar spine without canal stenosis or neural foraminal narrowing.    Discharge Instructions: Discharge Instructions    Ambulatory referral to Neurology    Complete by:  As directed    An appointment is requested in approximately: 2 weeks-3 weeks for newly diagnosed MS   Call MD for:  difficulty breathing, headache or visual disturbances    Complete by:  As directed    Call MD for:  extreme fatigue    Complete by:  As directed    Call MD for:  severe uncontrolled pain    Complete by:  As directed    Diet Carb Modified    Complete by:  As directed    Increase activity slowly    Complete by:  As directed       Signed: [name], MD  Internal Medicine PGY-1  P [phone]

## 2016-11-22 NOTE — Progress Notes (Signed)
Inpatient Rehabilitation  Received call from attending that following completion of neuro work-up patient ready for IP Rehab admission.  Note that neuro has finished their work-up.  Will proceed with IP Rehab admission later today.  Please call with questions.   Charlane Ferretti., CCC/SLP Admission Coordinator  [facility]  Cell [phone]

## 2016-11-22 NOTE — Progress Notes (Signed)
To room 13 per bed. Ill appearing cooperative pleasant. Oriented to room.

## 2016-11-22 NOTE — PMR Pre-admission (Signed)
PMR Admission Coordinator Pre-Admission Assessment  Patient: Debra [name] is an 51 y.o., female MRN: [id number] DOB: 08-18-1965 Height: _0  (162.6 cm) Weight: 76.2 kg (167 lb 15.9 oz)                                                                                                                                                  Insurance Information HMO:     PPO:      PCP:      IPA:      80/20:      OTHER:  PRIMARY: None, uninsured       Policy#:       Subscriber:  CM Name:       Phone#:      Fax#:  Pre-Cert#:       Employer:  Benefits:  Phone #:      Name:  Eff. Date:      Deduct:       Out of Pocket Max:       Life Max:  CIR:       SNF:  Outpatient:      Co-Pay:  Home Health:       Co-Pay:  DME:      Co-Pay:  Providers:   Has been seen by Debra [name]  Medicaid Application Date:       Case Manager:  Disability Application Date:       Case Worker:   Emergency Contact Information        Contact Information    Name Relation Home Work Mobile   [name],Debra Aunt [phone]       Current Medical History  Patient Admitting Diagnosis: LE weakness History of Present Illness: Debra Montville Reidis a 51 y.o.right handed femalewith history of CVA 1990 as well as gait dysfunction and patient was noncompliant with medical follow-up, type 1 diabetes mellitus with neuropathy, DKA, fibromyalgia. Per chart review patient lives with her boyfriend. Boyfriend works during the day and no other local family can assist. One level home with 1 step to entry. She was independent until approximately 3 weeks ago. Presented 11/15/2016 with progressive weakness bilateral lower extremities as well as back pain over the past 2 months and difficulty with ambulation as well as associated diarrhea. Noted sodium 127, creatinine 2.72.UDS positive opiates. Troponin 0.47 felt to be secondary to demand ischemia. CT abdomen and chest x-ray unremarkable. Glucose was 998 she was started on insulin drip. MRI with  thoracic lesion. Per reports, MRI lumbar and thoracic showed no canal stenosis or neural foraminal narrowing however there was some paraspinal muscle edema. Thoracic MRI showed a 3 x 13 mm intramedullary nonenhancing lesionT10 level. Serum CK was normal. Neurology did not recommend any further workup in regards to reported suspect lesion as there was no mass effect and cauda equina syndrome was ruled out. ESR and  CK were normal.MRI 11/21/2016 of the brain completed showing multifocal cerebral white matter changes in a distribution most consistent with demyelinating disease, multiple sclerosis. No other acute intracranial abnormality. MRI cervical spine 11/21/2016 showed patchy cord signal abnormality extending from the craniocervical junction through the upper thoracic spine again consistent with demyelinating disease. Evidence for active demyelination at the level of C1-2 and C6.neurology was contacted in regards to latest MRI findings and placed on IV Solu-Medrol for anticipation of 5 days. Initial plan for lumbar puncture but later discontinued after her MRI findings. Bouts of urinary retention with bladder scan 999 on 11/22/2016. Subcutaneous heparin for DVT prophylaxis. Physical occupational therapy evaluations completed. Recommendations were later made for physical medicine rehabilitation consult. Patient was admitted for a comprehensive rehabilitation program 11/22/16.  Past Medical History      Past Medical History:  Diagnosis Date  . Adenomatous colonic polyps   . Anemia    2005  . Anxiety    1990  . Arthritis   . Asthma    2000  . Cataract   . Depression 1990  . Difficult intubation    narrow airway  . Gastroesophageal Reflux Disease (GERD)   . Heart murmur    Birth  . Hyperlipidemia    2005  . Hypertension    1998  . Internal hemorrhoids 04/24/06   on colonoscopy  . Migraine   . Neuropathy of the hands & feet   . Restless Leg Syndrome   . Right Ankle  Fracture 10/06/2013  . Sleep paralysis   . Stable angina pectoris    2007: cath showing normal cors.   . Stroke 1990  . Type I Diabetes Mellitus 1988    Family History  family history includes Breast cancer in her maternal grandmother; Colon cancer in her maternal uncle; Hypertension in her father and mother; Kidney disease in her mother; Ovarian cancer in her paternal grandmother; Prostate cancer in her maternal grandfather and paternal grandfather.  Prior Rehab/Hospitalizations:  Has the patient had major surgery during 100 days prior to admission? No  Current Medications   Current Facility-Administered Medications:  .  acetaminophen (TYLENOL) tablet 650 mg, 650 mg, Oral, Q6H PRN, 650 mg at 11/20/16 2014 **OR** acetaminophen (TYLENOL) suppository 650 mg, 650 mg, Rectal, Q6H PRN, Debra Estelle, MD .  amLODipine (NORVASC) tablet 10 mg, 10 mg, Oral, Daily, Debra Estelle, MD, 10 mg at 11/21/16 0854 .  atorvastatin (LIPITOR) tablet 40 mg, 40 mg, Oral, Daily, Debra Estelle, MD, 40 mg at 11/21/16 0854 .  diclofenac sodium (VOLTAREN) 1 % transdermal gel 2 g, 2 g, Topical, BID, [name], Idalys, MD, 2 g at 11/21/16 0942 .  feeding supplement (GLUCERNA SHAKE) (GLUCERNA SHAKE) liquid 237 mL, 237 mL, Oral, TID BM, [name], Idalys, MD, 237 mL at 11/21/16 0942 .  gabapentin (NEURONTIN) capsule 600 mg, 600 mg, Oral, TID, [name], Idalys, MD, 600 mg at 11/21/16 0854 .  heparin injection 5,000 Units, 5,000 Units, Subcutaneous, Q8H, Debra Noss, MD, 5,000 Units at 11/21/16 0539 .  insulin aspart (novoLOG) injection 0-9 Units, 0-9 Units, Subcutaneous, Q4H, Debra Estelle, MD, 2 Units at 11/21/16 [phone] .  insulin aspart (novoLOG) injection 6 Units, 6 Units, Subcutaneous, TID WC, [name], Idalys, MD, 6 Units at 11/21/16 0851 .  insulin glargine (LANTUS) injection 25 Units, 25 Units, Subcutaneous, Daily, Debra Roche, MD, 25 Units at 11/21/16 2951 .  ketorolac  (TORADOL) 30 MG/ML injection 30 mg, 30 mg, Intravenous, Q6H, Debra Estelle, MD, 30 mg at 11/21/16 0539 .  lactated ringers bolus 1,000 mL, 1,000 mL, Intravenous, Once, [name], Joshua, PA-C .  lisinopril (PRINIVIL,ZESTRIL) tablet 40 mg, 40 mg, Oral, Daily, [name], Idalys, MD, 40 mg at 11/21/16 0854 .  metoprolol succinate (TOPROL-XL) 24 hr tablet 50 mg, 50 mg, Oral, Daily, Debra Estelle, MD, 50 mg at 11/21/16 0854 .  promethazine (PHENERGAN) tablet 12.5 mg, 12.5 mg, Oral, Q6H PRN, Debra Estelle, MD  Patients Current Diet: Diet Carb Modified Fluid consistency: Thin; Room service appropriate? Yes  Precautions / Restrictions Precautions Precautions: Fall Restrictions Weight Bearing Restrictions: Yes   Has the patient had 2 or more falls or a fall with injury in the past year?Yes, 10+ with no injuries per her report   Prior Activity Level Community (5-7x/wk): Prior to fall about 3 weeks ago patient was independent, driving, and working.  Since then she has declined to requiring assist with basic self care tasks.  She wants to regain her independence.   Home Assistive Devices / Equipment Home Assistive Devices/Equipment: Kasandra Knudsen (specify quad or straight) Home Equipment: Walker - 2 wheels, Wheelchair - manual, [employer] - single point  Prior Device Use: Indicate devices/aids used by the patient prior to current illness, exacerbation or injury? None of the above, prior to 3 weeks ago, then she declined to needing a wheelchair at home  Prior Functional Level Prior Function Level of Independence: Independent Comments: prior to her fall ~3 weeks ago pt was independent (including working); since then she has had great difficulty with ambulating inside her home and performing ADLs.  Self Care: Did the patient need help bathing, dressing, using the toilet or eating? Independent  Indoor Mobility: Did the patient need assistance with walking from room to room (with or without device)?  Independent  Stairs: Did the patient need assistance with internal or external stairs (with or without device)? Independent  Functional Cognition: Did the patient need help planning regular tasks such as shopping or remembering to take medications? Independent  Current Functional Level Cognition  Overall Cognitive Status: Within Functional Limits for tasks assessed Orientation Level: Oriented X4 General Comments: cognition not formally assessed but WNL for general conversation    Extremity Assessment (includes Sensation/Coordination)  Upper Extremity Assessment: Generalized weakness  Lower Extremity Assessment: Defer to PT evaluation RLE Deficits / Details: MMT revealed 2/5 for hip flexion, knee extension, knee flexion and ankle DF. Pt very limited functionally as well. Pt with impaired sensation to light touch throughout. RLE: Unable to fully assess due to pain RLE Sensation: decreased light touch RLE Coordination: decreased fine motor, decreased gross motor LLE Deficits / Details: MMT revealed 2/5 for hip flexion, knee flexion, knee extension and ankle DF. Pt very limited functionally as well. Pt with impaired sensation to light touch throughout. LLE: Unable to fully assess due to pain LLE Sensation: decreased light touch LLE Coordination: decreased fine motor, decreased gross motor    ADLs  Overall ADL's : Needs assistance/impaired Eating/Feeding: Set up, Bed level Grooming: Min guard, Sitting Grooming Details (indicate cue type and reason): Sitting EOB pt required Min Guard A due to poor balance and feeling dizzy. Pt requiring Mod-Max A to correct balance during single LOB while at EOB (posterior lean). Upper Body Bathing: Minimal assistance, Sitting Lower Body Bathing: Moderate assistance, Sit to/from stand Lower Body Bathing Details (indicate cue type and reason): Pt cleaned up after BM in bed with Mod A. Pt performing sit<>Stand with Mod A and then maintain standing with  Min A and RW. Pt abel to reach back to  perform toilet hygiene. However, requires assistance to make sure she was clean.  Upper Body Dressing : Minimal assistance, Sitting Lower Body Dressing: Maximal assistance, Sit to/from stand Lower Body Dressing Details (indicate cue type and reason): Pt attempt to adjust socks but became dizzy. Functional mobility during ADLs: Moderate assistance, Rolling walker, Cueing for sequencing, Cueing for safety (sit<>Stand only) General ADL Comments: Pt demonstrating decreased functional performance and requiring Min A for UB ADLs and Mod-max for LB ADLs. Pt limited by pain, fatigue, and dizziness.     Mobility  Overal bed mobility: Needs Assistance Bed Mobility: Rolling, Sidelying to Sit, Sit to Sidelying Rolling: Min guard Sidelying to sit: Min guard Sit to sidelying: Min assist General bed mobility comments: Pt seated in recliner on arrival.      Transfers  Overall transfer level: Needs assistance Equipment used: Rolling walker (2 wheeled) Transfers: Sit to/from Stand Sit to Stand: Mod assist General transfer comment: Assist to boost into standing with cues for hand placement to and from seated surface.  Pt with improved technique from previous standing trials.  In standing patient performed weight shifting and marching in place.      Ambulation / Gait / Stairs / Wheelchair Mobility  Ambulation/Gait Ambulation/Gait assistance: Min assist, Mod assist Ambulation Distance (Feet): 60 Feet Assistive device: Rolling walker (2 wheeled) Gait Pattern/deviations: Step-to pattern, Decreased stride length, Trunk flexed, Decreased dorsiflexion - right, Wide base of support General Gait Details: Pt performed weight shifting and marching pre gait and tolerated well.  During gait training patient presents with forward flexion and buckling in R knee.  Pt required cues for hip extension, quad control and B heel strike.   Gait velocity interpretation: Below normal  speed for age/gender    Posture / Balance Dynamic Sitting Balance Sitting balance - Comments: single LOB during sitting Balance Overall balance assessment: Needs assistance Sitting-balance support: Feet supported, No upper extremity supported Sitting balance-Leahy Scale: Fair Sitting balance - Comments: single LOB during sitting Postural control: Posterior lean Standing balance support: Bilateral upper extremity supported, During functional activity Standing balance-Leahy Scale: Poor Standing balance comment: Reliant on BUE on RW    Special needs/care consideration BiPAP/CPAP: no CPM: No Continuous Drip IV: No Dialysis: No         Life Vest: No Oxygen: No Special Bed: No Trach Size: No Wound Vac (area): No       Skin: WDL, dry, and patient reports skin discoloration in her calves  Bowel mgmt: Intermittent incontinence, last BM 11/19/16 Bladder mgmt: Acute urinary retention, requiring in and out catheterizations  Diabetic mgmt: Yes type 1, managed with CBG checks and sliding scale insulin per patient report       Previous Home Environment Living Arrangements: Spouse/significant other Available Help at Discharge: Family, Friend(s), Available PRN/intermittently Type of Home: House Home Layout: One level Home Access: Stairs to enter Technical brewer of Steps: 1 Bathroom Shower/Tub: Tub/shower unit, Charity fundraiser: Handicapped height Home Care Services: No  Discharge Living Setting Plans for Discharge Living Setting: Patient's home, Lives with (comment) (boyfriend ) Type of Home at Discharge: House Discharge Home Layout: One level Discharge Home Access: Stairs to enter Entrance Stairs-Rails: None Entrance Stairs-Number of Steps: 1 Discharge Bathroom Shower/Tub: Tub/shower unit, Door Discharge Bathroom Toilet: Handicapped height Discharge Bathroom Accessibility: Yes How Accessible: Accessible via walker Does the patient have any problems obtaining your  medications?: Yes (Describe) (she is uninsured )  Social/Family/Support Systems Patient Roles: Other (Comment) (significant other ) Anticipated Caregiver: Boyfriend PRN,  he works 12 hour shifts  Ability/Limitations of Caregiver: He works 12 hour shifts and is only able to assist at night   Caregiver Availability: Intermittent Discharge Plan Discussed with Primary Caregiver: No (discussed with patient ) Is Caregiver In Agreement with Plan?:  (Patient in agreement with plan ) Does Caregiver/Family have Issues with Lodging/Transportation while Pt is in Rehab?: No  Goals/Additional Needs Patient/Family Goal for Rehab: PT/OT Supervision-Min A Expected length of stay: 17-20 days  Cultural Considerations: None Dietary Needs: Carb. Mod. diet restrictions  Equipment Needs: TBD Special Service Needs: Patient has been in contact with her financial counselor and is monitoring if she is a Medicaid candidate  Additional Information: None Pt/Family Agrees to Admission and willing to participate: Yes Program Orientation Provided & Reviewed with Pt/Caregiver Including Roles  & Responsibilities: Yes Additional Information Needs: Wants aunt to remain as her emergency contact  Information Needs to be Provided By: FYI  Barriers to Discharge: Decreased caregiver support, Neurogenic Bowel & Bladder  Decrease burden of Care through IP rehab admission: No  Possible need for SNF placement upon discharge: No  Patient Condition: This patient's medical and functional status has changed since the consult dated 11/21/16 in which the Rehabilitation Physician determined and documented that the patient was potentially appropriate for intensive rehabilitative care in an inpatient rehabilitation facility. Issues have been addressed and update has been discussed with Dr. Posey Pronto and patient now appropriate for inpatient rehabilitation. Will admit to inpatient rehab today.   Preadmission Screen Completed By:  Gunnar Fusi, 11/22/2016 11:02 AM ______________________________________________________________________   Discussed status with Dr. Posey Pronto on 11/22/16 at 1102 and received telephone approval for admission today.  Admission Coordinator:  Gunnar Fusi, time 1102/Date 11/22/16

## 2016-11-22 NOTE — Progress Notes (Signed)
MRI performed earlier today is concerning for active demyelinating disease. I contacted on call neurology who recommended beginning IV Solumedrol 500 mg BID now.  Debra [name] was admitted for DKA and has had difficult to control blood glucose. CBG has trending in the 170-270s on monitoring pre meal, she has needed about 8-11 units of mealtime coverage and blood sugar still remains high on testing prior to the next meal. Her blood glucose will be difficult to control with the addition of solumedrol so we will begin monitoring the blood sugar more closely and increase her insulin regiment at this time.  - ordered IV solumedrol 500 mg BID - continue CBG monitoring every 4 hours  - increased to moderate sliding scale  - increased to 8 units with meals  - continue lantus 28 units qd

## 2016-11-22 NOTE — H&P (Signed)
Physical Medicine and Rehabilitation Admission H&P    Chief Complaint  Patient presents with  . Back Pain  . Emesis  : HPI: Debra [name] is a 51 y.o. right handed female with history of CVA 1990 as well as gait dysfunction and patient was noncompliant with medical follow-up, type 1 diabetes mellitus with neuropathy, DKA, fibromyalgia. Per chart review and patient, patient lives with her boyfriend. Boyfriend works during the day and no other local family can assist. One level home with 1 step to entry. She was independent until approximately 3 weeks ago. Presented 11/15/2016 with progressive weakness bilateral lower extremities as well as back pain over the past 2 months and difficulty with ambulation as well as associated diarrhea. Noted sodium 127, creatinine 2.72. UDS positive opiates.troponin 0.47 felt to be secondary to demand ischemia.CT abdomen and chest x-ray unremarkable. Glucose was 998 she was started on insulin drip. MRI with thoracic lesion.  Per reports, MRI lumbar and thoracic showed no canal stenosis or neural foraminal narrowing however there was some paraspinal muscle edema.Thoracic MRI showed a 3 x 13 mm intramedullary nonenhancing lesionT10 level.serum CK was normal. Neurology did not recommend any further workup in regards to reported suspect lesion as there was no mass effect and cauda equina syndrome was ruled out. ESR and CK were normal.MRI 09/25/2018of the brain completed showing multifocal cerebral white matter changes in a distribution most consistent with demyelinating disease, multiple sclerosis. No other acute intracranial abnormality.MRI cervical spine 11/21/2016 showed patchy cord signal abnormality extending from the craniocervical junction through the upper thoracic spine again consistent with demyelinating disease. Evidence for active demyelination at the level of C1-2 and C6.neurology was contacted in regards to latest MRI findings and placed on IV Solu-Medrol for  anticipation of 5 days.Initial plan for lumbar puncture but later discontinued after her MRI findings. Bouts of urinary retention with bladder scan 999on 11/22/2016. Subcutaneous heparin for DVT prophylaxis. Physical occupational therapy evaluations completed. Recommendations were later made for physical medicine rehabilitation consult.Patient was admitted for a comprehensive rehabilitation program  Review of Systems  Constitutional: Negative for fever.  HENT: Negative for hearing loss.   Eyes: Negative for blurred vision and double vision.  Respiratory: Negative for cough and shortness of breath.   Cardiovascular: Negative for chest pain, palpitations and leg swelling.  Gastrointestinal: Positive for constipation. Negative for nausea and vomiting.  Genitourinary: Positive for urgency.       Urinary retention  Musculoskeletal: Positive for back pain and myalgias.  Skin: Negative for rash.  Neurological: Positive for sensory change, focal weakness and headaches.  Psychiatric/Behavioral: Positive for depression.  All other systems reviewed and are negative.  Past Medical History:  Diagnosis Date  . Adenomatous colonic polyps   . Anemia    2005  . Anxiety    1990  . Arthritis   . Asthma    2000  . Cataract   . Depression 1990  . Difficult intubation    narrow airway  . Gastroesophageal Reflux Disease (GERD)   . Heart murmur    Birth  . Hyperlipidemia    2005  . Hypertension    1998  . Internal hemorrhoids 04/24/06   on colonoscopy  . Migraine   . Neuropathy of the hands & feet   . Restless Leg Syndrome   . Right Ankle Fracture 10/06/2013  . Sleep paralysis   . Stable angina pectoris    2007: cath showing normal cors.   . Stroke 1990  . Type  I Diabetes Mellitus 1988   Past Surgical History:  Procedure Laterality Date  . bilateral foot surgery    . BREAST SURGERY Left    biopsy left breast  . CARDIAC CATHETERIZATION  2007   around 2007 or 2008  . CESAREAN SECTION      . CHOLECYSTECTOMY    . COLONOSCOPY W/ BIOPSIES AND POLYPECTOMY    . DILATION AND CURETTAGE OF UTERUS  12/29/2010   Surgeon: Osborne Oman, MD;  Location: [facility];  Service: Gynecology  . ENDOMETRIAL ABLATION W/ NOVASURE N/A 12/2009  . EYE SURGERY    . HYSTEROSCOPY  12/29/2010   Procedure: HYSTEROSCOPY WITH HYDROTHERMAL ABLATION;  Surgeon: Osborne Oman, MD;  Location: [facility];  Service: Gynecology;;  . IUD REMOVAL  12/29/2010   Procedure: INTRAUTERINE DEVICE (IUD) REMOVAL;  Surgeon: Osborne Oman, MD;  Location: [facility];  Service: Gynecology;  Laterality: N/A;  . LAPAROSCOPIC TUBAL LIGATION  12/29/2010   Procedure: LAPAROSCOPIC TUBAL LIGATION;  Surgeon: Osborne Oman, MD;  Location: [facility];  Service: Gynecology;  Laterality: Bilateral;  . ORIF ANKLE FRACTURE Right 10/09/2013   Procedure: Open reduction internal fixation right ankle;  Surgeon: Nita Sells, MD;  Location: [facility];  Service: Orthopedics;  Laterality: Right;  Open reduction internal fixation right ankle  . TRIGGER FINGER RELEASE     x 3   Family History  Problem Relation Age of Onset  . Hypertension Mother   . Kidney disease Mother   . Hypertension Father   . Breast cancer Maternal Grandmother   . Prostate cancer Maternal Grandfather   . Ovarian cancer Paternal Grandmother   . Prostate cancer Paternal Grandfather   . Colon cancer Maternal Uncle        Family history of malignant neoplasm of gastrointestinal tract   Social History:  reports that she has never smoked. She has never used smokeless tobacco. She reports that she does not drink alcohol or use drugs. Allergies:  Allergies  Allergen Reactions  . Penicillins Anaphylaxis, Nausea And Vomiting and Rash    Has patient had a PCN reaction causing immediate rash, facial/tongue/throat swelling, SOB or lightheadedness with hypotension: Yes Has patient had a PCN reaction causing severe rash involving mucus membranes or skin necrosis: Yes Has patient had  a PCN reaction that required hospitalization No Has patient had a PCN reaction occurring within the last 10 years: Yes If all of the above answers are "NO", then may proceed with Cephalosporin use.   . Pollen Extract     Seasonal Allergies  . Tape Rash   Medications Prior to Admission  Medication Sig Dispense Refill  . albuterol (PROVENTIL HFA;VENTOLIN HFA) 108 (90 Base) MCG/ACT inhaler Inhale 1-2 puffs into the lungs every 6 (six) hours as needed for wheezing or shortness of breath. 3 Inhaler 0  . amLODipine (NORVASC) 10 MG tablet Take 1 tablet (10 mg total) by mouth daily. 90 tablet 1  . aspirin EC 81 MG tablet Take 1 tablet (81 mg total) by mouth daily. 90 tablet 3  . atorvastatin (LIPITOR) 40 MG tablet Take 1 tablet (40 mg total) by mouth daily. 90 tablet 3  . 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 The patient tests 4 times per day. 1 Device 0  . gabapentin (NEURONTIN) 300 MG capsule Take 1 capsule (300 mg total) by mouth at bedtime. IM program 1-time fill 30 capsule 0  . glucose blood (  AGAMATRIX AMP TEST) test strip Use as instructed 100 each 12  . insulin aspart (NOVOLOG) 100 UNIT/ML injection Inject 10 Units into the skin 3 (three) times daily before meals. 10 mL 5  . insulin glargine (LANTUS) 100 UNIT/ML injection Inject 0.33 mLs (33 Units total) into the skin at bedtime. 10 mL 3  . lisinopril-hydrochlorothiazide (PRINZIDE,ZESTORETIC) 20-12.5 MG tablet Take 2 tablets by mouth daily. 180 tablet 1  . Meloxicam 10 MG CAPS Take 15 mg by mouth every morning. 14 capsule 0  . metoprolol succinate (TOPROL XL) 100 MG 24 hr tablet Take 1 tablet (100 mg total) by mouth daily. Take with or immediately following a meal. 90 tablet 0  . nitroGLYCERIN (NITROSTAT) 0.4 MG SL tablet Place 1 tablet (0.4 mg total) under the tongue every 5 (five) minutes as needed for chest pain. 100 tablet 0  . Insulin Pen Needle 32G X 4 MM MISC  Use to inject insulin twice daily 100 each 5    Drug Regimen Review Drug regimen was reviewed and remains appropriate with no significant issues identified  Home: Home Living Family/patient expects to be discharged to:: Private residence Living Arrangements: Spouse/significant other Available Help at Discharge: Family, Friend(s), Available PRN/intermittently Type of Home: House Home Access: Stairs to enter Secretary/administrator of Steps: 1 Home Layout: One level Bathroom Shower/Tub: Tub/shower unit, Door Bathroom Toilet: Handicapped height Home Equipment: Environmental consultant - 2 wheels, Wheelchair - manual, [employer] - single point   Functional History: Prior Function Level of Independence: Independent Comments: prior to her fall ~3 weeks ago pt was independent (including working); since then she has had great difficulty with ambulating inside her home and performing ADLs.  Functional Status:  Mobility: Bed Mobility Overal bed mobility: Needs Assistance Bed Mobility: Rolling, Sidelying to Sit, Sit to Sidelying Rolling: Min guard Sidelying to sit: Min guard Sit to sidelying: Min assist General bed mobility comments: Pt seated in recliner on arrival.   Transfers Overall transfer level: Needs assistance Equipment used: Rolling walker (2 wheeled) Transfers: Sit to/from Stand Sit to Stand: Mod assist General transfer comment: Assist to boost into standing with cues for hand placement to and from seated surface.  Pt with improved technique from previous standing trials.  In standing patient performed weight shifting and marching in place.   Ambulation/Gait Ambulation/Gait assistance: Min assist, Mod assist Ambulation Distance (Feet): 60 Feet Assistive device: Rolling walker (2 wheeled) Gait Pattern/deviations: Step-to pattern, Decreased stride length, Trunk flexed, Decreased dorsiflexion - right, Wide base of support General Gait Details: Pt performed weight shifting and marching pre gait and  tolerated well.  During gait training patient presents with forward flexion and buckling in R knee.  Pt required cues for hip extension, quad control and B heel strike.   Gait velocity interpretation: Below normal speed for age/gender    ADL: ADL Overall ADL's : Needs assistance/impaired Eating/Feeding: Set up, Bed level Grooming: Min guard, Sitting Grooming Details (indicate cue type and reason): Sitting EOB pt required Min Guard A due to poor balance and feeling dizzy. Pt requiring Mod-Max A to correct balance during single LOB while at EOB (posterior lean). Upper Body Bathing: Minimal assistance, Sitting Lower Body Bathing: Moderate assistance, Sit to/from stand Lower Body Bathing Details (indicate cue type and reason): Pt cleaned up after BM in bed with Mod A. Pt performing sit<>Stand with Mod A and then maintain standing with Min A and RW. Pt abel to reach back to perform toilet hygiene. However, requires assistance to make sure she  was clean.  Upper Body Dressing : Minimal assistance, Sitting Lower Body Dressing: Maximal assistance, Sit to/from stand Lower Body Dressing Details (indicate cue type and reason): Pt attempt to adjust socks but became dizzy. Functional mobility during ADLs: Moderate assistance, Rolling walker, Cueing for sequencing, Cueing for safety (sit<>Stand only) General ADL Comments: Pt demonstrating decreased functional performance and requiring Min A for UB ADLs and Mod-max for LB ADLs. Pt limited by pain, fatigue, and dizziness.   Cognition: Cognition Overall Cognitive Status: Within Functional Limits for tasks assessed Orientation Level: Oriented X4 Cognition Arousal/Alertness: Awake/alert Behavior During Therapy: Flat affect Overall Cognitive Status: Within Functional Limits for tasks assessed General Comments: cognition not formally assessed but WNL for general conversation  Physical Exam: Blood pressure (!) 160/85, pulse 69, temperature 97.8 F (36.6 C),  temperature source Oral, resp. rate 20, height '5\' 4"'$  (1.626 m), weight 76.2 kg (167 lb 15.9 oz), SpO2 98 %. Physical Exam  Vitals reviewed. Constitutional: She is oriented to person, place, and time. She appears well-developed and well-nourished.  HENT:  Head: Normocephalic and atraumatic.  Eyes: EOM are normal. Right eye exhibits no discharge. Left eye exhibits no discharge.  Neck: Normal range of motion. Neck supple. No thyromegaly present.  Cardiovascular: Normal rate, regular rhythm and normal heart sounds.   Respiratory: Effort normal and breath sounds normal. No respiratory distress.  GI: Soft. Bowel sounds are normal. She exhibits no distension.  Musculoskeletal: She exhibits no edema or tenderness.  Neurological: She is alert and oriented to person, place, and time.  Motor: B/l UE 5/5 proximal to distal RLE: HF, KE 3/5, ADF/PF 3/5 LLE: HF, KE 4-/5, ADF/PF 4/5 DTRs symmetric Sensation diminished to light touch distal knees   Skin: Skin is warm and dry.  Psychiatric: She has a normal mood and affect. Her behavior is normal. Thought content normal.   Results for orders placed or performed during the hospital encounter of 11/15/16 (from the past 48 hour(s))  Glucose, capillary     Status: None   Collection Time: 11/19/16  7:57 AM  Result Value Ref Range   Glucose-Capillary 84 65 - 99 mg/dL  Glucose, capillary     Status: Abnormal   Collection Time: 11/19/16 12:07 PM  Result Value Ref Range   Glucose-Capillary 181 (H) 65 - 99 mg/dL  Glucose, capillary     Status: Abnormal   Collection Time: 11/19/16  5:26 PM  Result Value Ref Range   Glucose-Capillary 208 (H) 65 - 99 mg/dL  Glucose, capillary     Status: Abnormal   Collection Time: 11/19/16  8:36 PM  Result Value Ref Range   Glucose-Capillary 329 (H) 65 - 99 mg/dL  Glucose, capillary     Status: Abnormal   Collection Time: 11/20/16 12:44 AM  Result Value Ref Range   Glucose-Capillary 181 (H) 65 - 99 mg/dL  Basic metabolic  panel     Status: Abnormal   Collection Time: 11/20/16  4:42 AM  Result Value Ref Range   Sodium 135 135 - 145 mmol/L   Potassium 4.6 3.5 - 5.1 mmol/L   Chloride 104 101 - 111 mmol/L   CO2 21 (L) 22 - 32 mmol/L   Glucose, Bld 127 (H) 65 - 99 mg/dL   BUN 15 6 - 20 mg/dL   Creatinine, Ser 0.97 0.44 - 1.00 mg/dL   Calcium 8.4 (L) 8.9 - 10.3 mg/dL   GFR calc non Af Amer >60 >60 mL/min   GFR calc Af Amer >60 >60 mL/min  Comment: (NOTE) The eGFR has been calculated using the CKD EPI equation. This calculation has not been validated in all clinical situations. eGFR's persistently <60 mL/min signify possible Chronic Kidney Disease.    Anion gap 10 5 - 15  Glucose, capillary     Status: Abnormal   Collection Time: 11/20/16  5:38 AM  Result Value Ref Range   Glucose-Capillary 164 (H) 65 - 99 mg/dL  Glucose, capillary     Status: None   Collection Time: 11/20/16  8:09 AM  Result Value Ref Range   Glucose-Capillary 88 65 - 99 mg/dL  Glucose, capillary     Status: Abnormal   Collection Time: 11/20/16 12:07 PM  Result Value Ref Range   Glucose-Capillary 350 (H) 65 - 99 mg/dL  Glucose, capillary     Status: Abnormal   Collection Time: 11/20/16  4:47 PM  Result Value Ref Range   Glucose-Capillary 251 (H) 65 - 99 mg/dL  Glucose, capillary     Status: Abnormal   Collection Time: 11/20/16  8:18 PM  Result Value Ref Range   Glucose-Capillary 314 (H) 65 - 99 mg/dL  Glucose, capillary     Status: Abnormal   Collection Time: 11/21/16 12:10 AM  Result Value Ref Range   Glucose-Capillary 229 (H) 65 - 99 mg/dL  Glucose, capillary     Status: Abnormal   Collection Time: 11/21/16  4:19 AM  Result Value Ref Range   Glucose-Capillary 228 (H) 65 - 99 mg/dL   No results found.  Medical Problem List and Plan: 1.  Decreased functional mobility with bilateral lower extremity weakness secondary to active demyelinating disease/multiple sclerosis.Intravenous Solu-Medrol initiated 11/21/2016 2.  DVT  Prophylaxis/Anticoagulation: subcutaneous heparin. Monitor platelet counts any signs of bleeding 3. Pain Management/fibromyalgia: Neurontin 600 mg 3 times a day, Voltaren gel, oxycodone as needed 4. Mood: provide emotional support 5. Neuropsych: This patient his capable of making decisions on her own behalf. 6. Skin/Wound Care: Routine skin checks 7. Fluids/Electrolytes/Nutrition: Routine I&O with follow-up chemistries 8.Hypertension.Norvasc 10 mg daily, Toprol 50 mg daily, lisinopril 40 mg daily 9.Diabetes mellitus of peripheral neuropathy.Latest hemoglobin A1c of 14.Lantus insulin 28 units daily, NovoLog 8 units 3 times a day.. Check blood sugars before meals and at bedtime. Diabetic teaching. Monitor blood sugars closely while on Solu-Medrol 10.Hyperlipidemia. Lipitor 11.Urinary retention. Check PVRs 3.Will need discontinuation of Foley tube if not already completed 12.Medical noncompliance. Provide counseling    Post Admission Physician Evaluation: 1. Preadmission assessment reviewed and changes made below. 2. Functional deficits secondary  to MS. 3. Patient is admitted to receive collaborative, interdisciplinary care between the physiatrist, rehab nursing staff, and therapy team. 4. Patient's level of medical complexity and substantial therapy needs in context of that medical necessity cannot be provided at a lesser intensity of care such as a SNF. 5. Patient has experienced substantial functional loss from his/her baseline which was documented above under the "Functional History" and "Functional Status" headings.  Judging by the patient's diagnosis, physical exam, and functional history, the patient has potential for functional progress which will result in measurable gains while on inpatient rehab.  These gains will be of substantial and practical use upon discharge  in facilitating mobility and self-care at the household level. 6. Physiatrist will provide 24 hour management of medical  needs as well as oversight of the therapy plan/treatment and provide guidance as appropriate regarding the interaction of the two. 7. 24 hour rehab nursing will assist with bladder management, bowel management, safety, disease management and  patient education  and help integrate therapy concepts, techniques,education, etc. 8. PT will assess and treat for/with: Lower extremity strength, range of motion, stamina, balance, functional mobility, safety, adaptive techniques and equipment, coping skills, pain control, education.   Goals are: Mod I/Supervision. 9. OT will assess and treat for/with: ADL's, functional mobility, safety, upper extremity strength, adaptive techniques and equipment, ego support, and community reintegration.   Goals are: Mod I/Supervision. Therapy may proceed with showering this patient. 10. Case Management and Social Worker will assess and treat for psychological issues and discharge planning. 11. Team conference will be held weekly to assess progress toward goals and to determine barriers to discharge. 12. Patient will receive at least 3 hours of therapy per day at least 5 days per week. 13. ELOS: 4-7 days.       14. Prognosis:  good   Delice Lesch, MD, ABPMR Lauraine Rinne J., PA-C 11/21/2016

## 2016-11-22 NOTE — Progress Notes (Signed)
Pt did not urinate through the night, bladder scan performed, it indicated more than 999 ml of urine in bladder. Pt in no distress. Intern on call notified.

## 2016-11-23 ENCOUNTER — Inpatient Hospital Stay (HOSPITAL_COMMUNITY): Payer: Self-pay | Admitting: Occupational Therapy

## 2016-11-23 ENCOUNTER — Inpatient Hospital Stay (HOSPITAL_COMMUNITY): Payer: Self-pay | Admitting: Physical Therapy

## 2016-11-23 ENCOUNTER — Inpatient Hospital Stay (HOSPITAL_COMMUNITY): Payer: Self-pay

## 2016-11-23 DIAGNOSIS — R29898 Other symptoms and signs involving the musculoskeletal system: Secondary | ICD-10-CM

## 2016-11-23 LAB — GLUCOSE, CAPILLARY
GLUCOSE-CAPILLARY: 322 mg/dL — AB (ref 65–99)
GLUCOSE-CAPILLARY: 374 mg/dL — AB (ref 65–99)
GLUCOSE-CAPILLARY: 445 mg/dL — AB (ref 65–99)
GLUCOSE-CAPILLARY: 464 mg/dL — AB (ref 65–99)
GLUCOSE-CAPILLARY: 467 mg/dL — AB (ref 65–99)
Glucose-Capillary: 313 mg/dL — ABNORMAL HIGH (ref 65–99)
Glucose-Capillary: 360 mg/dL — ABNORMAL HIGH (ref 65–99)
Glucose-Capillary: 367 mg/dL — ABNORMAL HIGH (ref 65–99)
Glucose-Capillary: 411 mg/dL — ABNORMAL HIGH (ref 65–99)
Glucose-Capillary: 511 mg/dL (ref 65–99)

## 2016-11-23 LAB — GLUCOSE, RANDOM: Glucose, Bld: 377 mg/dL — ABNORMAL HIGH (ref 65–99)

## 2016-11-23 MED ORDER — INSULIN ASPART 100 UNIT/ML ~~LOC~~ SOLN
0.0000 [IU] | Freq: Every day | SUBCUTANEOUS | Status: DC
  Administered 2016-11-23: 4 [IU] via SUBCUTANEOUS
  Administered 2016-11-24: 2 [IU] via SUBCUTANEOUS
  Administered 2016-11-26: 4 [IU] via SUBCUTANEOUS
  Administered 2016-11-29: 3 [IU] via SUBCUTANEOUS

## 2016-11-23 MED ORDER — INSULIN ASPART 100 UNIT/ML ~~LOC~~ SOLN
6.0000 [IU] | Freq: Three times a day (TID) | SUBCUTANEOUS | Status: DC
  Administered 2016-11-23 – 2016-12-04 (×26): 6 [IU] via SUBCUTANEOUS

## 2016-11-23 MED ORDER — SODIUM CHLORIDE 0.9% FLUSH: 10.0000 mL | INTRAVENOUS | Status: DC | PRN

## 2016-11-23 MED ORDER — INSULIN ASPART 100 UNIT/ML ~~LOC~~ SOLN
15.0000 [IU] | Freq: Once | SUBCUTANEOUS | Status: AC
  Administered 2016-11-23: 15 [IU] via SUBCUTANEOUS

## 2016-11-23 MED ORDER — METHYLPREDNISOLONE SODIUM SUCC 125 MG IJ SOLR
INTRAMUSCULAR | Status: AC
  Administered 2016-11-23: 125 mg
  Filled 2016-11-23: qty 2

## 2016-11-23 MED ORDER — INSULIN GLARGINE 100 UNIT/ML ~~LOC~~ SOLN
25.0000 [IU] | Freq: Two times a day (BID) | SUBCUTANEOUS | Status: DC
  Administered 2016-11-23 – 2016-11-25 (×5): 25 [IU] via SUBCUTANEOUS
  Filled 2016-11-23 (×5): qty 0.25

## 2016-11-23 MED ORDER — INSULIN ASPART 100 UNIT/ML ~~LOC~~ SOLN
0.0000 [IU] | Freq: Three times a day (TID) | SUBCUTANEOUS | Status: DC
  Administered 2016-11-23: 20 [IU] via SUBCUTANEOUS
  Administered 2016-11-24: 7 [IU] via SUBCUTANEOUS
  Administered 2016-11-24: 15 [IU] via SUBCUTANEOUS
  Administered 2016-11-25 – 2016-11-26 (×2): 4 [IU] via SUBCUTANEOUS
  Administered 2016-11-27: 7 [IU] via SUBCUTANEOUS
  Administered 2016-11-28 – 2016-11-29 (×2): 4 [IU] via SUBCUTANEOUS
  Administered 2016-11-29: 20 [IU] via SUBCUTANEOUS
  Administered 2016-11-30: 7 [IU] via SUBCUTANEOUS
  Administered 2016-11-30: 11 [IU] via SUBCUTANEOUS
  Administered 2016-12-01: 15 [IU] via SUBCUTANEOUS
  Administered 2016-12-01 – 2016-12-02 (×2): 4 [IU] via SUBCUTANEOUS

## 2016-11-23 MED ORDER — SODIUM CHLORIDE 0.9 % IV SOLN
INTRAVENOUS | Status: DC
  Administered 2016-11-23: 01:00:00 via INTRAVENOUS

## 2016-11-23 NOTE — Progress Notes (Signed)
Debra Arn, MD Physician Signed Physical Medicine and Rehabilitation  Consult Note Date of Service: 11/20/2016 11:32 AM  Related encounter: ED to Hosp-Admission (Discharged) from 11/15/2016 in [facility] All Collapse All   '[]'$ Hide copied text '[]'$ Hover for attribution information      Physical Medicine and Rehabilitation Consult Reason for Consult: Decreased functional mobility Referring Physician: Internal medicine   HPI: Debra [name] is a 51 y.o. right handed female with history of CVA 1990, type 1 diabetes mellitus with neuropathy, DKA, fibromyalgia. Per chart review patient lives with her boyfriend. Boyfriend works during the day and no other local family can assist. One level home with 1 step to entry. She was independent until approximately 3 weeks ago. Presented 11/15/2016 with progressive weakness bilateral lower extremities as well as back pain over the past 2 months and difficulty with ambulation as well as associated diarrhea. Noted sodium 127, creatinine 2.72. CT abdomen and chest x-ray unremarkable. Glucose was 998 she was started on insulin drip. MRI with thoracic lesion.  Per reports, MRI lumbar and thoracic showed no canal stenosis or neural foraminal narrowing. Thoracic MRI showed a 3 x 13 mm intramedullary nonenhancing lesion. Neurology did not recommend any further workup in regards to reported suspect lesion as there was no mass effect and cauda equina syndrome was ruled out. ESR and CK were normal. Subcutaneous heparin for DVT prophylaxis. Physical occupational therapy evaluations completed. Recommendations were later made for physical medicine rehabilitation consult.   Review of Systems  Constitutional: Negative for chills and fever.  HENT: Negative for hearing loss.   Eyes: Negative for blurred vision and double vision.  Respiratory: Negative for cough and shortness of breath.   Cardiovascular: Negative for chest pain,  palpitations and leg swelling.  Gastrointestinal: Positive for constipation. Negative for nausea.  Genitourinary: Positive for urgency. Negative for dysuria, flank pain and hematuria.  Musculoskeletal: Positive for back pain and myalgias.  Skin: Negative for rash.  Neurological: Positive for sensory change, focal weakness, weakness and headaches. Negative for seizures.  Psychiatric/Behavioral: Positive for depression.       Anxiety  All other systems reviewed and are negative.      Past Medical History:  Diagnosis Date  . Adenomatous colonic polyps   . Anemia    2005  . Anxiety    1990  . Arthritis   . Asthma    2000  . Cataract   . Depression 1990  . Difficult intubation    narrow airway  . Gastroesophageal Reflux Disease (GERD)   . Heart murmur    Birth  . Hyperlipidemia    2005  . Hypertension    1998  . Internal hemorrhoids 04/24/06   on colonoscopy  . Migraine   . Neuropathy of the hands & feet   . Restless Leg Syndrome   . Right Ankle Fracture 10/06/2013  . Sleep paralysis   . Stable angina pectoris    2007: cath showing normal cors.   . Stroke 1990  . Type I Diabetes Mellitus 1988        Past Surgical History:  Procedure Laterality Date  . bilateral foot surgery    . BREAST SURGERY Left    biopsy left breast  . CARDIAC CATHETERIZATION  2007   around 2007 or 2008  . CESAREAN SECTION    . CHOLECYSTECTOMY    . COLONOSCOPY W/ BIOPSIES AND POLYPECTOMY    . DILATION AND CURETTAGE OF UTERUS  12/29/2010   Surgeon: Osborne Oman, MD;  Location: [facility];  Service: Gynecology  . ENDOMETRIAL ABLATION W/ NOVASURE N/A 12/2009  . EYE SURGERY    . HYSTEROSCOPY  12/29/2010   Procedure: HYSTEROSCOPY WITH HYDROTHERMAL ABLATION;  Surgeon: Osborne Oman, MD;  Location: [facility];  Service: Gynecology;;  . IUD REMOVAL  12/29/2010   Procedure: INTRAUTERINE DEVICE (IUD) REMOVAL;  Surgeon: Osborne Oman, MD;  Location: [facility];  Service: Gynecology;  Laterality: N/A;  . LAPAROSCOPIC TUBAL LIGATION  12/29/2010   Procedure: LAPAROSCOPIC TUBAL LIGATION;  Surgeon: Osborne Oman, MD;  Location: [facility];  Service: Gynecology;  Laterality: Bilateral;  . ORIF ANKLE FRACTURE Right 10/09/2013   Procedure: Open reduction internal fixation right ankle;  Surgeon: Nita Sells, MD;  Location: [facility];  Service: Orthopedics;  Laterality: Right;  Open reduction internal fixation right ankle  . TRIGGER FINGER RELEASE     x 3        Family History  Problem Relation Age of Onset  . Hypertension Mother   . Kidney disease Mother   . Hypertension Father   . Breast cancer Maternal Grandmother   . Prostate cancer Maternal Grandfather   . Ovarian cancer Paternal Grandmother   . Prostate cancer Paternal Grandfather   . Colon cancer Maternal Uncle        Family history of malignant neoplasm of gastrointestinal tract   Social History:  reports that she has never smoked. She has never used smokeless tobacco. She reports that she does not drink alcohol or use drugs. Allergies:       Allergies  Allergen Reactions  . Penicillins Anaphylaxis, Nausea And Vomiting and Rash    Has patient had a PCN reaction causing immediate rash, facial/tongue/throat swelling, SOB or lightheadedness with hypotension: Yes Has patient had a PCN reaction causing severe rash involving mucus membranes or skin necrosis: Yes Has patient had a PCN reaction that required hospitalization No Has patient had a PCN reaction occurring within the last 10 years: Yes If all of the above answers are "NO", then may proceed with Cephalosporin use.   . Pollen Extract     Seasonal Allergies  . Tape Rash         Medications Prior to Admission  Medication Sig Dispense Refill  . albuterol (PROVENTIL HFA;VENTOLIN HFA) 108 (90 Base) MCG/ACT inhaler Inhale 1-2 puffs into the lungs every 6 (six) hours as needed for wheezing or shortness of  breath. 3 Inhaler 0  . amLODipine (NORVASC) 10 MG tablet Take 1 tablet (10 mg total) by mouth daily. 90 tablet 1  . aspirin EC 81 MG tablet Take 1 tablet (81 mg total) by mouth daily. 90 tablet 3  . atorvastatin (LIPITOR) 40 MG tablet Take 1 tablet (40 mg total) by mouth daily. 90 tablet 3  . 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 The patient tests 4 times per day. 1 Device 0  . gabapentin (NEURONTIN) 300 MG capsule Take 1 capsule (300 mg total) by mouth at bedtime. IM program 1-time fill 30 capsule 0  . glucose blood (AGAMATRIX AMP TEST) test strip Use as instructed 100 each 12  . insulin aspart (NOVOLOG) 100 UNIT/ML injection Inject 10 Units into the skin 3 (three) times daily before meals. 10 mL 5  . insulin glargine (LANTUS) 100 UNIT/ML injection Inject 0.33 mLs (33 Units total) into the skin at bedtime. 10 mL 3  .  lisinopril-hydrochlorothiazide (PRINZIDE,ZESTORETIC) 20-12.5 MG tablet Take 2 tablets by mouth daily. 180 tablet 1  . Meloxicam 10 MG CAPS Take 15 mg by mouth every morning. 14 capsule 0  . metoprolol succinate (TOPROL XL) 100 MG 24 hr tablet Take 1 tablet (100 mg total) by mouth daily. Take with or immediately following a meal. 90 tablet 0  . nitroGLYCERIN (NITROSTAT) 0.4 MG SL tablet Place 1 tablet (0.4 mg total) under the tongue every 5 (five) minutes as needed for chest pain. 100 tablet 0  . Insulin Pen Needle 32G X 4 MM MISC Use to inject insulin twice daily 100 each 5    Home: Home Living Family/patient expects to be discharged to:: Private residence Living Arrangements: Spouse/significant other Available Help at Discharge: Family, Friend(s), Available PRN/intermittently Type of Home: House Home Access: Stairs to enter Secretary/administrator of Steps: 1 Home Layout: One level Bathroom Shower/Tub: Tub/shower unit, Door Bathroom Toilet: Handicapped height Home Equipment: Environmental consultant - 2 wheels,  Wheelchair - manual, [employer] - single point  Functional History: Prior Function Level of Independence: Independent Comments: prior to her fall ~3 weeks ago pt was independent (including working); since then she has had great difficulty with ambulating inside her home and performing ADLs. Functional Status:  Mobility: Bed Mobility Overal bed mobility: Needs Assistance Bed Mobility: Rolling, Sidelying to Sit, Sit to Sidelying Rolling: Min guard Sidelying to sit: Min guard Sit to sidelying: Min assist General bed mobility comments: Pt seated in recliner on arrival.   Transfers Overall transfer level: Needs assistance Equipment used: Rolling walker (2 wheeled) Transfers: Sit to/from Stand Sit to Stand: Mod assist General transfer comment: Assist to boost into standing with cues for hand placement to and from seated surface.  Pt with improved technique from previous standing trials.  In standing patient performed weight shifting and marching in place.   Ambulation/Gait Ambulation/Gait assistance: Min assist, Mod assist Ambulation Distance (Feet): 60 Feet Assistive device: Rolling walker (2 wheeled) Gait Pattern/deviations: Step-to pattern, Decreased stride length, Trunk flexed, Decreased dorsiflexion - right, Wide base of support General Gait Details: Pt performed weight shifting and marching pre gait and tolerated well.  During gait training patient presents with forward flexion and buckling in R knee.  Pt required cues for hip extension, quad control and B heel strike.   Gait velocity interpretation: Below normal speed for age/gender  ADL: ADL Overall ADL's : Needs assistance/impaired Eating/Feeding: Set up, Bed level Grooming: Min guard, Sitting Grooming Details (indicate cue type and reason): Sitting EOB pt required Min Guard A due to poor balance and feeling dizzy. Pt requiring Mod-Max A to correct balance during single LOB while at EOB (posterior lean). Upper Body Bathing: Minimal  assistance, Sitting Lower Body Bathing: Moderate assistance, Sit to/from stand Lower Body Bathing Details (indicate cue type and reason): Pt cleaned up after BM in bed with Mod A. Pt performing sit<>Stand with Mod A and then maintain standing with Min A and RW. Pt abel to reach back to perform toilet hygiene. However, requires assistance to make sure she was clean.  Upper Body Dressing : Minimal assistance, Sitting Lower Body Dressing: Maximal assistance, Sit to/from stand Lower Body Dressing Details (indicate cue type and reason): Pt attempt to adjust socks but became dizzy. Functional mobility during ADLs: Moderate assistance, Rolling walker, Cueing for sequencing, Cueing for safety (sit<>Stand only) General ADL Comments: Pt demonstrating decreased functional performance and requiring Min A for UB ADLs and Mod-max for LB ADLs. Pt limited by pain, fatigue, and dizziness.  Cognition: Cognition Overall Cognitive Status: Within Functional Limits for tasks assessed Orientation Level: Oriented X4 Cognition Arousal/Alertness: Awake/alert Behavior During Therapy: Flat affect Overall Cognitive Status: Within Functional Limits for tasks assessed General Comments: cognition not formally assessed but WNL for general conversation  Blood pressure (!) 219/96, pulse 70, temperature 97.7 F (36.5 C), temperature source Oral, resp. rate 15, height '5\' 4"'$  (1.626 m), weight 76.2 kg (167 lb 15.9 oz), SpO2 100 %. Physical Exam  Vitals reviewed. Constitutional: She is oriented to person, place, and time. She appears well-developed and well-nourished.  HENT:  Head: Normocephalic and atraumatic.  Eyes: EOM are normal. Right eye exhibits no discharge. Left eye exhibits no discharge.  Neck: Normal range of motion. Neck supple. No thyromegaly present.  Cardiovascular: Normal rate, regular rhythm and normal heart sounds.   Respiratory: Effort normal and breath sounds normal. No respiratory distress.  GI: Soft.  Bowel sounds are normal. She exhibits no distension.  Musculoskeletal: She exhibits no edema or tenderness.  Neurological: She is alert and oriented to person, place, and time.  Motor: B/l UE 5/5 proximal to distal RLE: HF, KE 2+/5, ADF/PF 3-/5 LLE: HF, KE 3+/5, ADF/PF 4-/5DTRs symmetric Sensation diminished to light touch distal knees  Skin: Skin is warm and dry.  Psychiatric: She has a normal mood and affect. Her behavior is normal. Thought content normal.      Assessment/Plan: Diagnosis: LE weakness Labs and images independently reviewed.  Records reviewed and summated above.  1. Does the need for close, 24 hr/day medical supervision in concert with the patient's rehab needs make it unreasonable for this patient to be served in a less intensive setting? Potentially 2. Co-Morbidities requiring supervision/potential complications: CVA 5885, uncontrolled type 1 diabetes mellitus with neuropathy (Monitor in accordance with exercise and adjust meds as necessary), DKA, fibromyalgia, HTN with HTN urgency (monitor and provide prns in accordance with increased physical exertion and pain), AKI (avoid nephrotoxic meds)  3. Due to bladder management, safety and skin/wound care, does the patient require 24 hr/day rehab nursing? Potentially 4. Does the patient require coordinated care of a physician, rehab nurse, PT (1-2 hrs/day, 5 days/week) and OT (1-2 hrs/day, 5 days/week) to address physical and functional deficits in the context of the above medical diagnosis(es)? Yes Addressing deficits in the following areas: balance, endurance, locomotion, strength, transferring, bowel/bladder control, bathing, dressing, toileting and psychosocial support 5. Can the patient actively participate in an intensive therapy program of at least 3 hrs of therapy per day at least 5 days per week? Potentially 6. The potential for patient to make measurable gains while on inpatient rehab is excellent 7. Anticipated  functional outcomes upon discharge from inpatient rehab are supervision and min assist  with PT, supervision and min assist with OT, n/a with SLP. 8. Estimated rehab length of stay to reach the above functional goals is: 17-20 days. 9. Anticipated D/C setting: Other 10. Anticipated post D/C treatments: SNF 11. Overall Rehab/Functional Prognosis: good  RECOMMENDATIONS: This patient's condition is appropriate for continued rehabilitative care in the following setting: CIR if able to tolerate therapies when medically stable and workup complete. Patient has agreed to participate in recommended program. Yes Note that insurance prior authorization may be required for reimbursement for recommended care.  Comment: Rehab Admissions Coordinator to follow up.  Delice Lesch, MD, ABPMR Debra [name]., PA-C 11/20/2016    Revision History  Routing History

## 2016-11-23 NOTE — Evaluation (Signed)
Occupational Therapy Assessment and Plan  Patient Details  Name: Debra [name] MRN: [id number] Date of Birth: 1965/05/17  OT Diagnosis: acute pain, disturbance of vision and muscle weakness (generalized) Rehab Potential: Rehab Potential (ACUTE ONLY): Good ELOS: 7-10 days   Today's Date: 11/23/2016 OT Individual Time: 7858-8502 OT Individual Time Calculation (min): 75 min     Problem List:  Patient Active Problem List   Diagnosis Date Noted  . Multiple sclerosis ([place]) 11/22/2016  . Thoracic root lesion 11/22/2016  . MS (multiple sclerosis) ([place])   . Neuropathic pain   . Uncontrolled type 2 diabetes mellitus with peripheral neuropathy ([place])   . Urinary retention   . Medically noncompliant   . Leg weakness, bilateral 11/20/2016  . Diabetic ketoacidosis without coma associated with type 1 diabetes mellitus ([place])   . Leg pain   . History of CVA (cerebrovascular accident)   . Uncontrolled type 1 diabetes mellitus with diabetic peripheral neuropathy ([place])   . Benign essential HTN   . Hypertensive urgency   . AKI (acute kidney injury) ([place])   . Fibromyalgia 11/07/2016  . Back pain 10/31/2016  . Stable angina pectoris ([place])   . Vitamin D deficiency 08/31/2014  . 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 Hyperlipidemia 10/06/2013  . Depression 10/16/2012  . Bilateral knee pain 02/16/2012  . Chronic diarrhea 10/19/2010  . Peripheral neuropathy 08/19/2010  . 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 Hypertension 07/06/2010  . Asthma 07/06/2010  . Migraine 07/06/2010    Past Medical History:  Past Medical History:  Diagnosis Date  . Adenomatous colonic polyps   . Anemia    2005  . Anxiety    1990  . Arthritis   . Asthma    2000  . Cataract   . Depression 1990  . Difficult intubation    narrow airway  . Gastroesophageal Reflux Disease (GERD)   . Heart murmur    Birth  . Hyperlipidemia    2005  . Hypertension     1998  . Internal hemorrhoids 04/24/06   on colonoscopy  . Migraine   . Neuropathy of the hands & feet   . Restless Leg Syndrome   . Right Ankle Fracture 10/06/2013  . Sleep paralysis   . Stable angina pectoris    2007: cath showing normal cors.   . Stroke 1990  . Type I Diabetes Mellitus 1988   Past Surgical History:  Past Surgical History:  Procedure Laterality Date  . bilateral foot surgery    . BREAST SURGERY Left    biopsy left breast  . CARDIAC CATHETERIZATION  2007   around 2007 or 2008  . CESAREAN SECTION    . CHOLECYSTECTOMY    . COLONOSCOPY W/ BIOPSIES AND POLYPECTOMY    . DILATION AND CURETTAGE OF UTERUS  12/29/2010   Surgeon: Osborne Oman, MD;  Location: [facility];  Service: Gynecology  . ENDOMETRIAL ABLATION W/ NOVASURE N/A 12/2009  . EYE SURGERY    . HYSTEROSCOPY  12/29/2010   Procedure: HYSTEROSCOPY WITH HYDROTHERMAL ABLATION;  Surgeon: Osborne Oman, MD;  Location: [facility];  Service: Gynecology;;  . IUD REMOVAL  12/29/2010   Procedure: INTRAUTERINE DEVICE (IUD) REMOVAL;  Surgeon: Osborne Oman, MD;  Location: [facility];  Service: Gynecology;  Laterality: N/A;  . LAPAROSCOPIC TUBAL LIGATION  12/29/2010   Procedure: LAPAROSCOPIC TUBAL LIGATION;  Surgeon: Osborne Oman, MD;  Location: [facility];  Service: Gynecology;  Laterality: Bilateral;  . ORIF ANKLE FRACTURE Right 10/09/2013   Procedure: Open reduction internal fixation right ankle;  Surgeon: Nita Sells, MD;  Location: [facility];  Service: Orthopedics;  Laterality: Right;  Open reduction internal fixation right ankle  . TRIGGER FINGER RELEASE     x 3    Assessment & Plan Clinical Impression: Patient is a 51 y.o. right handed femalewith history of CVA 1990 as well as gait dysfunction and patient was noncompliant with medical follow-up, type 1 diabetes mellitus with neuropathy, DKA, fibromyalgia. Per chart review and patient, patient lives with her boyfriend. Boyfriend works during the day and no  other local family can assist. One level home with 1 step to entry. She was independent until approximately 3 weeks ago. Presented 11/15/2016 with progressive weakness bilateral lower extremities as well as back pain over the past 2 months and difficulty with ambulation as well as associated diarrhea. Noted sodium 127, creatinine 2.72.UDS positive opiates.troponin 0.47 felt to be secondary to demand ischemia.CT abdomen and chest x-ray unremarkable. Glucose was 998 she was started on insulin drip. MRI with thoracic lesion. Per reports, MRI lumbar and thoracic showed no canal stenosis or neural foraminal narrowing however there was some paraspinal muscle edema.Thoracic MRI showed a 3 x 13 mm intramedullary nonenhancing lesionT10 level.serum CK was normal. Neurology did not recommend any further workup in regards to reported suspect lesion as there was no mass effect and cauda equina syndrome was ruled out. ESR and CK were normal.MRI 09/25/2018of the brain completed showing multifocal cerebral white matter changes in a distribution most consistent with demyelinating disease, multiple sclerosis. No other acute intracranial abnormality.MRI cervical spine 11/21/2016 showed patchy cord signal abnormality extending from the craniocervical junction through the upper thoracic spine again consistent with demyelinating disease. Evidence for active demyelination at the level of C1-2 and C6.neurology was contacted in regards to latest MRI findings and placed on IV Solu-Medrol for anticipation of 5 days.Initial plan for lumbar puncture but later discontinued after her MRI findings. Bouts of urinary retention with bladder scan 999on 11/22/2016. Subcutaneous heparin for DVT prophylaxis.   Patient transferred to CIR on 11/22/2016 .    Patient currently requires mod with basic self-care skills secondary to muscle weakness, decreased cardiorespiratoy endurance, unbalanced muscle activation, decreased coordination and decreased  motor planning, decreased visual motor skills and decreased standing balance, decreased postural control and decreased balance strategies.  Prior to hospitalization, patient could complete ADLs and IADLs with independent .  Patient will benefit from skilled intervention to decrease level of assist with basic self-care skills and increase independence with basic self-care skills prior to discharge home with care partner.  Anticipate patient will require intermittent supervision and follow up home health.  OT - End of Session Activity Tolerance: Tolerates 30+ min activity with multiple rests Endurance Deficit: Yes Endurance Deficit Description: requries rest breaks during functional tasks OT Assessment Rehab Potential (ACUTE ONLY): Good OT Barriers to Discharge: Decreased caregiver support OT Patient demonstrates impairments in the following area(s): Balance;Endurance;Motor;Sensory;Safety;Pain OT Basic ADL's Functional Problem(s): Grooming;Bathing;Dressing;Toileting OT Advanced ADL's Functional Problem(s): Simple Meal Preparation OT Transfers Functional Problem(s): Toilet;Tub/Shower OT Additional Impairment(s): Fuctional Use of Upper Extremity OT Plan OT Intensity: Minimum of 1-2 x/day, 45 to 90 minutes OT Frequency: 5 out of 7 days OT Duration/Estimated Length of Stay: 7-10 days OT Treatment/Interventions: Balance/vestibular training;Community reintegration;Discharge planning;Disease mangement/prevention;DME/adaptive equipment instruction;Functional mobility training;Pain management;Patient/family education;Psychosocial support;Self Care/advanced ADL retraining;Neuromuscular re-education;Skin care/wound managment;Therapeutic Activities;Therapeutic Exercise;UE/LE Strength taining/ROM;UE/LE Coordination activities;Visual/perceptual remediation/compensation;Wheelchair propulsion/positioning OT Self Feeding  Anticipated Outcome(s): No goal OT Basic Self-Care Anticipated Outcome(s): Mod I OT  Toileting Anticipated Outcome(s): Mod I OT Bathroom Transfers Anticipated Outcome(s): Mod I - Supervision OT Recommendation Recommendations for Other Services: Therapeutic Recreation consult Therapeutic Recreation Interventions: Outing/community reintergration Patient destination: Home Follow Up Recommendations: Home health OT Equipment Recommended: Tub/shower seat;To be determined   Skilled Therapeutic Intervention OT eval completed with discussion of rehab process, OT purpose, POC, ELOS, and goals.  ADL assessment completed with bathing and dressing at sit > stand level at sink.  Pt with decreased sensation in feet requiring min assist for standing balance and short distance ambulation.  Mod assist initially for sit > stand with cues to improve positioning of BLE to increase stability.  Pt reports decreased coordination in BUE requiring increased time with fine motor tasks.  Difficulty crossing RLE over knee to allow pt to doff or don socks/shoes requiring assistance. Pt very motivated to progress and with multiple questions regarding new diagnosis.    OT Evaluation Precautions/Restrictions  Precautions Precautions: Fall Pain Pain Assessment Pain Assessment: 0-10 Pain Score: 4  Pain Location: Leg Pain Orientation: Right;Left Home Living/Prior Functioning Home Living Family/patient expects to be discharged to:: Private residence Living Arrangements: Spouse/significant other Available Help at Discharge: Family, Friend(s), Available PRN/intermittently Type of Home: House Home Access: Stairs to enter Technical brewer of Steps: 1 Home Layout: One level Bathroom Shower/Tub: Public librarian, Charity fundraiser: Handicapped height  Lives With: Significant other (lives with boyfriend) IADL History Homemaking Responsibilities: Yes Meal Prep Responsibility: Therapist, occupational Responsibility: Primary Cleaning Responsibility: Primary Current License: Yes Mode of Transportation:  Car Occupation: Part time employment Type of Occupation: Nurse, adult work Prior Function Level of Independence: Independent with basic ADLs, Independent with homemaking with ambulation, Independent with gait  Able to Take Stairs?: Yes Driving: Yes Vocation: Part time employment ADL  See Function Navigator Vision Baseline Vision/History: Wears glasses;Cataracts Wears Glasses: At all times Patient Visual Report: Blurring of vision;Other (comment) (increased difficulty with night time vision) Vision Assessment?: Yes Eye Alignment: Within Functional Limits Ocular Range of Motion: Within Functional Limits Alignment/Gaze Preference: Within Defined Limits Tracking/Visual Pursuits: Decreased smoothness of vertical tracking Saccades: Within functional limits Convergence: Within functional limits Perception  Perception: Within Functional Limits Cognition Overall Cognitive Status: Within Functional Limits for tasks assessed Arousal/Alertness: Awake/alert Orientation Level: Person;Place;Situation Person: Oriented Place: Oriented Situation: Oriented Year: 2018 Month: September Day of Week: Correct (Thursday) Memory: Appears intact Immediate Memory Recall: Sock;Blue;Bed Memory Recall: Sock;Blue;Bed Memory Recall Sock: Without Cue Memory Recall Blue: Without Cue Memory Recall Bed: Without Cue Awareness: Appears intact Problem Solving: Appears intact Safety/Judgment: Appears intact Sensation Sensation Light Touch: Appears Intact Stereognosis: Not tested Hot/Cold: Not tested Proprioception: Appears Intact Coordination Gross Motor Movements are Fluid and Coordinated: No Coordination and Movement Description: Pt reports noting increased difficulty with fine motor control tasks "for a while" especially when sorting coins at grocery store Finger Nose Finger Test: WFL, however Lt slower than Rt 9 Hole Peg Test: Rt: 56 seconds, Lt: 59 seconds Extremity/Trunk Assessment RUE  Assessment RUE Assessment: Within Functional Limits (ROM WFL, strength 5/5) LUE Assessment LUE Assessment: Within Functional Limits (ROM WFL, strength 5/5)   See Function Navigator for Current Functional Status.   Refer to Care Plan for Long Term Goals  Recommendations for other services: Neuropsych and Therapeutic Recreation  Stress management and Outing/community reintegration   Discharge Criteria: Patient will be discharged from OT if patient refuses treatment 3 consecutive times without medical reason, if treatment goals not met,  if there is a change in medical status, if patient makes no progress towards goals or if patient is discharged from hospital.  The above assessment, treatment plan, treatment alternatives and goals were discussed and mutually agreed upon: by patient  Simonne Come 11/23/2016, 9:13 AM

## 2016-11-23 NOTE — Progress Notes (Signed)
[facility]     PROGRESS NOTE    Subjective/Complaints: Had a reasonable night. Legs hurt--tingling/burning. Ready for therapies today.   ROS: pt denies nausea, vomiting, diarrhea, cough, shortness of breath or chest pain   Objective: Vital Signs: Blood pressure (!) 187/88, pulse 75, temperature 98.1 F (36.7 C), temperature source Oral, resp. rate 18, SpO2 99 %. Mr Laqueta Jean Wo Contrast  Result Date: 11/21/2016 CLINICAL DATA:  Initial evaluation for newly diagnosed multiple sclerosis. EXAM: MRI HEAD WITHOUT AND WITH CONTRAST MRI CERVICAL SPINE WITHOUT AND WITH CONTRAST TECHNIQUE: Multiplanar, multiecho pulse sequences of the brain and surrounding structures, and cervical spine, to include the craniocervical junction and cervicothoracic junction, were obtained without and with intravenous contrast. CONTRAST:  47mL MULTIHANCE GADOBENATE DIMEGLUMINE 529 MG/ML IV SOLN COMPARISON:  Prior CT from 10/02/2013. FINDINGS: MRI HEAD FINDINGS Brain: Cerebral volume within normal limits for age. Patchy and confluent multifocal T2/FLAIR hyperintensities seen involving the periventricular, deep, and subcortical white matter both cerebral hemispheres. Several of these foci radiating any perpendicular fashion from the closest septal interface and lateral ventricles, characteristic for underlying demyelinating disease. Many of these foci demonstrate associated restricted diffusion weighted patchy peripheral enhancement, consistent with active demyelination (Series 3, image 38). Probable faint patchy involvement within the pons as well. No other definite infratentorial foci. A superimposed component of chronic microvascular ischemic changes may be contributory as well. No evidence for acute vascular infarct. Gray-white matter differentiation maintained. No evidence for acute intracranial hemorrhage. Few scattered subcentimeter foci susceptibility artifact seen involving the  supratentorial brain, consistent with small chronic micro hemorrhages, suspected to be related hypertensive in nature. No mass lesion, midline shift or mass effect. No hydrocephalus. No extra-axial fluid collection. Major dural sinuses grossly patent. Pituitary gland suprasellar region normal. Midline structures intact and within normal limits. Vascular: Major intracranial vascular flow voids are maintained. Skull and upper cervical spine: Demyelinating lesion noted within the upper cervical spine at the level of C1-2. Associated active demyelination. Craniocervical junction otherwise unremarkable. Bone marrow signal intensity within normal limits. No scalp soft tissue abnormality. Sinuses/Orbits: Globes and orbital soft tissues within normal limits. Scattered mucosal thickening present throughout the paranasal sinuses. Superimposed fluid levels present within the sphenoid sinuses bilaterally, consistent with acute sinusitis. No mastoid effusion. Inner ear structures normal. Other: None. MRI CERVICAL SPINE FINDINGS Alignment: Straightening of the normal cervical lordosis. No listhesis. Vertebrae: Vertebral body heights maintained. No evidence for acute or chronic fracture. Bone marrow signal intensity within normal limits. No discrete or worrisome osseous lesions. Cord: Patchy cord signal abnormality present within the right lateral aspect of the cervical spinal cord at the level of C1-2 (Series 500, image 171). Associated faint patchy enhancement with diffusion abnormality on corresponding brain MRI consistent with active demyelination. Additional patchy signal abnormality within the dorsal aspect of the cord at C2-3 (series 17, image 5, 7). Faint patchy signal abnormality at the level of C4 (series 17, image 11). Patchy cord signal abnormality with dorsal and left aspect of the cord at C6 (series 17, image 20). Faint patchy enhancement at this level as well suspicious for active demyelination (series 19, image 7).  Probable additional patchy signal abnormality within the right aspect of the cord at C7 (series 17, image 24). Probable patchy cord signal abnormality within the visualized upper thoracic cord at C3 (series 13, image 6). Overall cord caliber within normal limits. Posterior Fossa, vertebral arteries, paraspinal tissues: Craniocervical junction normal. Paraspinous soft tissues within normal limits. Normal intravascular  flow voids present within the vertebral arteries bilaterally. Disc levels: C2-C3: Unremarkable. C3-C4:  Unremarkable. C4-C5: Broad right eccentric disc bulge. No significant spinal stenosis. Mild right C5 foraminal stenosis. C5-C6: Broad central disc protrusion mildly indents the ventral thecal sac. No significant canal or foraminal stenosis. C6-C7: Broad posterior disc protrusion flattens and partially faces the ventral thecal sac. No significant canal or foraminal stenosis. C7-T1:  Unremarkable. IMPRESSION: MRI HEAD IMPRESSION: 1. Multifocal cerebral white matter changes in a distribution most consistent with demyelinating disease, multiple sclerosis. Associated restricted diffusion with patchy enhancement about several of these lesions consistent with active demyelination. 2. No other acute intracranial abnormality. 3. Acute pan sinusitis. MRI CERVICAL SPINE IMPRESSION: 1. Patchy cord signal abnormality extending from the craniocervical junction through the upper thoracic spine, consistent with demyelinating disease. Evidence for active demyelination at the level of C1-2 and C6 as above. 2. Mild multilevel degenerative disc bulging at C3-4 through C6-7 without significant stenosis. Electronically Signed   By: Rise Mu M.D.   On: 11/21/2016 21:47   Mr Cervical Spine W Wo Contrast  Result Date: 11/21/2016 CLINICAL DATA:  Initial evaluation for newly diagnosed multiple sclerosis. EXAM: MRI HEAD WITHOUT AND WITH CONTRAST MRI CERVICAL SPINE WITHOUT AND WITH CONTRAST TECHNIQUE: Multiplanar,  multiecho pulse sequences of the brain and surrounding structures, and cervical spine, to include the craniocervical junction and cervicothoracic junction, were obtained without and with intravenous contrast. CONTRAST:  30mL MULTIHANCE GADOBENATE DIMEGLUMINE 529 MG/ML IV SOLN COMPARISON:  Prior CT from 10/02/2013. FINDINGS: MRI HEAD FINDINGS Brain: Cerebral volume within normal limits for age. Patchy and confluent multifocal T2/FLAIR hyperintensities seen involving the periventricular, deep, and subcortical white matter both cerebral hemispheres. Several of these foci radiating any perpendicular fashion from the closest septal interface and lateral ventricles, characteristic for underlying demyelinating disease. Many of these foci demonstrate associated restricted diffusion weighted patchy peripheral enhancement, consistent with active demyelination (Series 3, image 38). Probable faint patchy involvement within the pons as well. No other definite infratentorial foci. A superimposed component of chronic microvascular ischemic changes may be contributory as well. No evidence for acute vascular infarct. Gray-white matter differentiation maintained. No evidence for acute intracranial hemorrhage. Few scattered subcentimeter foci susceptibility artifact seen involving the supratentorial brain, consistent with small chronic micro hemorrhages, suspected to be related hypertensive in nature. No mass lesion, midline shift or mass effect. No hydrocephalus. No extra-axial fluid collection. Major dural sinuses grossly patent. Pituitary gland suprasellar region normal. Midline structures intact and within normal limits. Vascular: Major intracranial vascular flow voids are maintained. Skull and upper cervical spine: Demyelinating lesion noted within the upper cervical spine at the level of C1-2. Associated active demyelination. Craniocervical junction otherwise unremarkable. Bone marrow signal intensity within normal limits. No  scalp soft tissue abnormality. Sinuses/Orbits: Globes and orbital soft tissues within normal limits. Scattered mucosal thickening present throughout the paranasal sinuses. Superimposed fluid levels present within the sphenoid sinuses bilaterally, consistent with acute sinusitis. No mastoid effusion. Inner ear structures normal. Other: None. MRI CERVICAL SPINE FINDINGS Alignment: Straightening of the normal cervical lordosis. No listhesis. Vertebrae: Vertebral body heights maintained. No evidence for acute or chronic fracture. Bone marrow signal intensity within normal limits. No discrete or worrisome osseous lesions. Cord: Patchy cord signal abnormality present within the right lateral aspect of the cervical spinal cord at the level of C1-2 (Series 500, image 171). Associated faint patchy enhancement with diffusion abnormality on corresponding brain MRI consistent with active demyelination. Additional patchy signal abnormality within the dorsal aspect of  the cord at C2-3 (series 17, image 5, 7). Faint patchy signal abnormality at the level of C4 (series 17, image 11). Patchy cord signal abnormality with dorsal and left aspect of the cord at C6 (series 17, image 20). Faint patchy enhancement at this level as well suspicious for active demyelination (series 19, image 7). Probable additional patchy signal abnormality within the right aspect of the cord at C7 (series 17, image 24). Probable patchy cord signal abnormality within the visualized upper thoracic cord at C3 (series 13, image 6). Overall cord caliber within normal limits. Posterior Fossa, vertebral arteries, paraspinal tissues: Craniocervical junction normal. Paraspinous soft tissues within normal limits. Normal intravascular flow voids present within the vertebral arteries bilaterally. Disc levels: C2-C3: Unremarkable. C3-C4:  Unremarkable. C4-C5: Broad right eccentric disc bulge. No significant spinal stenosis. Mild right C5 foraminal stenosis. C5-C6: Broad  central disc protrusion mildly indents the ventral thecal sac. No significant canal or foraminal stenosis. C6-C7: Broad posterior disc protrusion flattens and partially faces the ventral thecal sac. No significant canal or foraminal stenosis. C7-T1:  Unremarkable. IMPRESSION: MRI HEAD IMPRESSION: 1. Multifocal cerebral white matter changes in a distribution most consistent with demyelinating disease, multiple sclerosis. Associated restricted diffusion with patchy enhancement about several of these lesions consistent with active demyelination. 2. No other acute intracranial abnormality. 3. Acute pan sinusitis. MRI CERVICAL SPINE IMPRESSION: 1. Patchy cord signal abnormality extending from the craniocervical junction through the upper thoracic spine, consistent with demyelinating disease. Evidence for active demyelination at the level of C1-2 and C6 as above. 2. Mild multilevel degenerative disc bulging at C3-4 through C6-7 without significant stenosis. Electronically Signed   By: Rise Mu M.D.   On: 11/21/2016 21:47    Recent Labs  11/22/16 1941  WBC 16.7*  HGB 13.0  HCT 39.3  PLT 303    Recent Labs  11/22/16 0356 11/22/16 1941 11/23/16 0215  NA 137 132*  --   K 4.1 4.7  --   CL 104 97*  --   GLUCOSE 122* 284* 377*  BUN 20 28*  --   CREATININE 1.27* 1.28*  --   CALCIUM 8.7* 9.3  --    CBG (last 3)   Recent Labs  11/23/16 0219 11/23/16 0402 11/23/16 0846  GLUCAP 374* 322* 445*    Wt Readings from Last 3 Encounters:  11/18/16 76.2 kg (167 lb 15.9 oz)  11/07/16 68.4 kg (150 lb 12.8 oz)  10/31/16 69.5 kg (153 lb 4.8 oz)    Physical Exam:  Constitutional: She is oriented to person, place, and time. She appears well-developed and well-nourished.  HENT:  Head: Normocephalic and atraumatic.  Eyes: EOM are normal. Right eye exhibits no discharge. Left eye exhibits no discharge.  Neck: Normal range of motion. Neck supple. No thyromegaly present.  Cardiovascular: RRR  without murmur. No JVD    Respiratory: CTA Bilaterally without wheezes or rales. Normal effort .  GI: soft NT  Musculoskeletal: She exhibits no edema or tenderness.  Neurological: She is alert and oriented to person, place, and time.  Motor: B/l UE 5/5 proximal to distal RLE: HF, KE 3/5, ADF/PF 3 to 3+/5 LLE: HF, KE 4-/5, ADF/PF 4/5 DTRs 2+ Sensation diminished to light touch in bilateral lower ext as well as distal arms.   Skin: Skin is warm and dry.  Psychiatric: pleasant  Assessment/Plan: 1. Functional and neurological deficits secondary to multiple sclerosis which require 3+ hours per day of interdisciplinary therapy in a comprehensive inpatient rehab setting. Physiatrist is  providing close team supervision and 24 hour management of active medical problems listed below. Physiatrist and rehab team continue to assess barriers to discharge/monitor patient progress toward functional and medical goals.  Function:  Bathing Bathing position   Position: Wheelchair/chair at sink  Bathing parts Body parts bathed by patient: Right arm, Left arm, Chest, Abdomen, Right upper leg Body parts bathed by helper: Buttocks, Right lower leg, Left lower leg, Back  Bathing assist Assist Level: Touching or steadying assistance(Pt > 75%) (Mod assist)      Upper Body Dressing/Undressing Upper body dressing   What is the patient wearing?: Pull over shirt/dress     Pull over shirt/dress - Perfomed by patient: Thread/unthread right sleeve, Thread/unthread left sleeve, Pull shirt over trunk, Put head through opening          Upper body assist Assist Level: Touching or steadying assistance(Pt > 75%)      Lower Body Dressing/Undressing Lower body dressing   What is the patient wearing?: Pants, Shoes, Non-skid slipper socks     Pants- Performed by patient: Thread/unthread left pants leg Pants- Performed by helper: Thread/unthread right pants leg, Pull pants up/down Non-skid slipper socks-  Performed by patient: Don/doff left sock Non-skid slipper socks- Performed by helper: Don/doff right sock     Shoes - Performed by patient: Don/doff left shoe Shoes - Performed by helper: Don/doff right shoe          Lower body assist Assist for lower body dressing:  (Max assist)      Toileting Toileting          Toileting assist     Transfers Chair/bed transfer   Chair/bed transfer method: Ambulatory Chair/bed transfer assist level: Moderate assist (Pt 50 - 74%/lift or lower)       Locomotion Ambulation           Wheelchair          Cognition Comprehension Comprehension assist level: Follows complex conversation/direction with no assist  Expression Expression assist level: Expresses complex ideas: With no assist  Social Interaction Social Interaction assist level: Interacts appropriately with others - No medications needed.  Problem Solving Problem solving assist level: Solves basic problems with no assist  Memory Memory assist level: More than reasonable amount of time   Medical Problem List and Plan: 1.  Decreased functional mobility with bilateral lower extremity weakness secondary to active demyelinating disease/multiple sclerosis.  -Intravenous Solu-Medrol initiated 11/21/2016  -beginning therapies today 2.  DVT Prophylaxis/Anticoagulation: subcutaneous heparin. Monitor platelet counts any signs of bleeding 3. Pain Management/fibromyalgia: Neurontin 600 mg 3 times a day, Voltaren gel, oxycodone as needed for dysesthetic pain 4. Mood: provide emotional support 5. Neuropsych: This patient his capable of making decisions on her own behalf. 6. Skin/Wound Care: Routine skin checks 7. Fluids/Electrolytes/Nutrition: Routine I&O with follow-up chemistries reviewed personally  -recheck tomorrow  -hold IVF for now 8.Hypertension.Norvasc 10 mg daily, Toprol 50 mg daily, lisinopril 40 mg daily 9.Diabetes mellitus of peripheral neuropathy.Latest hemoglobin A1c of  14.Lantus insulin 28 units daily, NovoLog 8 units 3 times a day..   -sugars out of control with steroids on board---increase lantus to  25 units bid 10.Hyperlipidemia. Lipitor 11.Urinary retention. Remove foley  -voiding trial, check pvr's  -cath prn 12.Medical noncompliance. Provide counseling   LOS (Days) 1 A FACE TO FACE EVALUATION WAS PERFORMED  Faith Rogue T, MD 11/23/2016 10:14 AM

## 2016-11-23 NOTE — Progress Notes (Signed)
Inpatient Diabetes Program Recommendations  AACE/ADA: New Consensus Statement on Inpatient Glycemic Control (2015)  Target Ranges:  Prepandial:   less than 140 mg/dL      Peak postprandial:   less than 180 mg/dL (1-2 hours)      Critically ill patients:  140 - 180 mg/dL   Lab Results  Component Value Date   GLUCAP 367 (H) 11/23/2016   HGBA1C >14.0 10/31/2016    Review of Glycemic Control Results for Debra [name], Debra [name] (MRN [id number]) as of 11/23/2016 13:13  Ref. Range 11/23/2016 00:54 11/23/2016 02:19 11/23/2016 04:02 11/23/2016 08:46 11/23/2016 12:28  Glucose-Capillary Latest Ref Range: 65 - 99 mg/dL 224 (H) 497 (H) 530 (H) 445 (H) 367 (H)   Diabetes history:DM1 (makes NO insulin; requires basal, correction, and meal coverage insulin) Outpatient Diabetes medications: Lantus 33 units daily, Novolog 10 units TID with meals  Inpatient Diabetes Program Recommendations:    While on large doses of steroids: -Increase Lantus to 30 units bid -Increase Novolog meal coverage to 10 units tid if eats 50%  Thank you, Darel Hong E. Livy Ross, RN, MSN, CDE  Diabetes Coordinator Inpatient Glycemic Control Team Team Pager [phone] (8am-5pm) 11/23/2016 1:14 PM

## 2016-11-23 NOTE — Progress Notes (Signed)
Illene Silver PA notified of blood glucose of 467, give 15 units per sliding scale. Continue with protocol q 4 hours

## 2016-11-23 NOTE — Progress Notes (Signed)
Occupational Therapy Session Note  Patient Details  Name: Debra [name] MRN: [id number] Date of Birth: Aug 14, 1965  Today's Date: 11/23/2016 OT Individual Time: 0930-1030 OT Individual Time Calculation (min): 60 min    Short Term Goals: Week 1:  OT Short Term Goal 1 (Week 1): STG = LTGs due to ELOS  Skilled Therapeutic Interventions/Progress Updates:  At start of session, pt was in bathroom in w/c preparing to transfer to toilet. She stated that she was not aware that she was always supposed to ask for help. Educated pt on safety precautions and pt states that she now understands.  A BSC placed over toilet to give her rails to push up from.  Pt accomplished stand pivots to and from toilet and standing for clothing management all with min A.  She was able to maintain knee instability.  A smaller w/c with cushion obtained for pt. During the session her IV was continually alarming and needed to be reset. RN aware and came to room to adjust IV.  Pt also practiced stand pivot/ step transfers from w/c >< bed with min A.  Excellent sitting balance.  Worked on standing balance for a few minutes with min A.  She needed cues for safe hand placement with sit to stands. At end of session, pt in wc with RN in room with her.   Therapy Documentation Precautions:  Precautions Precautions: Fall   Pain: Pain Assessment Pain Assessment: 0-10 Pain Score: 4  Pain Location: Leg Pain Orientation: Right;Left ADL:  See Function Navigator for Current Functional Status.   Therapy/Group: Individual Therapy  Serrita Lueth 11/23/2016, 9:15 AM

## 2016-11-23 NOTE — Progress Notes (Signed)
Recommendations from neurology Dr. Amada Jupiter is for IV Solu-Medrol initiated 11/22/2016 to continue 5 days total and then stop

## 2016-11-23 NOTE — Progress Notes (Signed)
Initial Nutrition Assessment  DOCUMENTATION CODES:   Not applicable  INTERVENTION:  Glucerna Shake po TID, each supplement provides 220 kcal and 10 grams of protein  Discontinue Ensure Enlive  NUTRITION DIAGNOSIS:   Inadequate oral intake related to nausea, poor appetite as evidenced by per patient/family report.  GOAL:   Patient will meet greater than or equal to 90% of their needs  MONITOR:   PO intake, Supplement acceptance, Weight trends, Labs, I & O's  REASON FOR ASSESSMENT:   Malnutrition Screening Tool    ASSESSMENT:   Pt with PMH of type I DM with neuropathy, HLD, HTN, GERD, DKA presents with thoracic root lesion   Pt reports a decrease in appetite PTA. Reports eating 2-3 meals and snacks. Snacks include oatmeal cookies, yogurt, etc. Pt reports good PO intake since admission, states she consumes almost all if not all of her meals. Per chart review pt consumed 100% of breakfast this morning.   Pt reports significant recent weight loss of 70 lbs in the past 6-7 months. Pt reports a UBW of 170 lbs, pt is currently 167 lbs. Per chart pt has fluctuated however seems to trend upwards.  Pt reports slight nausea at time of visit.   Nutrition focused physical exam completed. Findings include no fat depletion, mild muscle depletion in the temple region only, and moderate edema.   Labs reviewed; CBG 144-555, Na 132  Medications reviewed; sliding scale insulin, Lantus, Protonix, sorbitol, Solu-medrol  Diet Order:  Diet Carb Modified Fluid consistency: Thin; Room service appropriate? Yes  Skin:  Reviewed, no issues  Last BM:  11/21/16  Height:   Ht Readings from Last 1 Encounters:  11/15/16 5\' 4"  (1.626 m)    Weight:   Wt Readings from Last 1 Encounters:  11/18/16 167 lb 15.9 oz (76.2 kg)    Ideal Body Weight:  54.5 kg  BMI:  There is no height or weight on file to calculate BMI.  Estimated Nutritional Needs:   Kcal:  1800-2000  Protein:  90-115  grams  Fluid:  >/= 1.8 L/d  EDUCATION NEEDS:   Education needs no appropriate at this time  Fransisca Kaufmann, MS, RDN, LDN 11/23/2016 4:08 PM

## 2016-11-23 NOTE — Progress Notes (Signed)
Patient information reviewed and entered into [facility] system by Santos Hardwick, RN, CRRN, PPS Coordinator.  Information including medical coding and functional independence measure will be reviewed and updated through discharge.    

## 2016-11-23 NOTE — Progress Notes (Addendum)
Pt resting in bed quietly. Easily aroused. C/o back and hip pain and pain management administered both oral / topical and effective. New #24 to right thumb placed by IV team this shift and solumedrol administered per order with noted fsbs @ 2000 = 342 checked by NT; 2200 checked by RN = 555 and t/c placed to provider on call to report. New orders received for Niovolog 15 units; Q 30 min fsbs x 3 then call provider to report. The fsbs = 2330 464, [phone] = 411 and 0200=374. New orders received then includes initiate NS IVF stat at 100cc/hr, stat lab glucose, recheck fsbs x 1 hour and administer novolog 5 units if >400 only. Additional 5 units of novolog not necessary as fsbs = 374. 0400 fsbs=322 and no coverage administered as it was < 400. Pt educated on pain management and hyglycemic management. callbell within reach. Safety maintained. Will continue to monitor. Cath care provided this shift per facility policy.

## 2016-11-23 NOTE — Evaluation (Signed)
Physical Therapy Assessment and Plan  Patient Details  Name: Debra [name] MRN: [id number] Date of Birth: 1965-10-07  PT Diagnosis: Abnormality of gait, Difficulty walking, Impaired sensation and Muscle weakness Rehab Potential: Good ELOS: 10-14 days   Today's Date: 11/23/2016 PT Individual Time: 1330-1430 PT Individual Time Calculation (min): 60 min    Problem List:  Patient Active Problem List   Diagnosis Date Noted  . Multiple sclerosis ([place]) 11/22/2016  . Thoracic root lesion 11/22/2016  . MS (multiple sclerosis) ([place])   . Neuropathic pain   . Uncontrolled type 2 diabetes mellitus with peripheral neuropathy ([place])   . Urinary retention   . Medically noncompliant   . Leg weakness, bilateral 11/20/2016  . Diabetic ketoacidosis without coma associated with type 1 diabetes mellitus ([place])   . Leg pain   . History of CVA (cerebrovascular accident)   . Uncontrolled type 1 diabetes mellitus with diabetic peripheral neuropathy ([place])   . Benign essential HTN   . Hypertensive urgency   . AKI (acute kidney injury) ([place])   . Fibromyalgia 11/07/2016  . Back pain 10/31/2016  . Stable angina pectoris ([place])   . Vitamin D deficiency 08/31/2014  . 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 Hyperlipidemia 10/06/2013  . Depression 10/16/2012  . Bilateral knee pain 02/16/2012  . Chronic diarrhea 10/19/2010  . Peripheral neuropathy 08/19/2010  . 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 Hypertension 07/06/2010  . Asthma 07/06/2010  . Migraine 07/06/2010    Past Medical History:  Past Medical History:  Diagnosis Date  . Adenomatous colonic polyps   . Anemia    2005  . Anxiety    1990  . Arthritis   . Asthma    2000  . Cataract   . Depression 1990  . Difficult intubation    narrow airway  . Gastroesophageal Reflux Disease (GERD)   . Heart murmur    Birth  . Hyperlipidemia    2005  . Hypertension    1998  . Internal  hemorrhoids 04/24/06   on colonoscopy  . Migraine   . Neuropathy of the hands & feet   . Restless Leg Syndrome   . Right Ankle Fracture 10/06/2013  . Sleep paralysis   . Stable angina pectoris    2007: cath showing normal cors.   . Stroke 1990  . Type I Diabetes Mellitus 1988   Past Surgical History:  Past Surgical History:  Procedure Laterality Date  . bilateral foot surgery    . BREAST SURGERY Left    biopsy left breast  . CARDIAC CATHETERIZATION  2007   around 2007 or 2008  . CESAREAN SECTION    . CHOLECYSTECTOMY    . COLONOSCOPY W/ BIOPSIES AND POLYPECTOMY    . DILATION AND CURETTAGE OF UTERUS  12/29/2010   Surgeon: Osborne Oman, MD;  Location: [facility];  Service: Gynecology  . ENDOMETRIAL ABLATION W/ NOVASURE N/A 12/2009  . EYE SURGERY    . HYSTEROSCOPY  12/29/2010   Procedure: HYSTEROSCOPY WITH HYDROTHERMAL ABLATION;  Surgeon: Osborne Oman, MD;  Location: [facility];  Service: Gynecology;;  . IUD REMOVAL  12/29/2010   Procedure: INTRAUTERINE DEVICE (IUD) REMOVAL;  Surgeon: Osborne Oman, MD;  Location: [facility];  Service: Gynecology;  Laterality: N/A;  . LAPAROSCOPIC TUBAL LIGATION  12/29/2010   Procedure: LAPAROSCOPIC TUBAL LIGATION;  Surgeon: Osborne Oman, MD;  Location: [facility];  Service: Gynecology;  Laterality: Bilateral;  .  ORIF ANKLE FRACTURE Right 10/09/2013   Procedure: Open reduction internal fixation right ankle;  Surgeon: Mable Paris, MD;  Location: [facility];  Service: Orthopedics;  Laterality: Right;  Open reduction internal fixation right ankle  . TRIGGER FINGER RELEASE     x 3    Assessment & Plan Clinical Impression: Patient is a 51 y.o. year old female with recent admission to the hospital on 11/15/2016 with progressive weakness bilateral lower extremities as well as back pain over the past 2 months and difficulty with ambulation as well as associated diarrhea. Noted sodium 127, creatinine 2.72.UDS positive opiates.troponin 0.47 felt to be  secondary to demand ischemia.CT abdomen and chest x-ray unremarkable. Glucose was 998 she was started on insulin drip. MRI with thoracic lesion. Per reports, MRI lumbar and thoracic showed no canal stenosis or neural foraminal narrowing however there was some paraspinal muscle edema.Thoracic MRI showed a 3 x 13 mm intramedullary nonenhancing lesionT10 level.serum CK was normal. Neurology did not recommend any further workup in regards to reported suspect lesion as there was no mass effect and cauda equina syndrome was ruled out. ESR and CK were normal.MRI 09/25/2018of the brain completed showing multifocal cerebral white matter changes in a distribution most consistent with demyelinating disease, multiple sclerosis. No other acute intracranial abnormality.MRI cervical spine 11/21/2016 showed patchy cord signal abnormality extending from the craniocervical junction through the upper thoracic spine again consistent with demyelinating disease. Evidence for active demyelination at the level of C1-2 and C6.  Patient transferred to CIR on 11/22/2016 .   Patient currently requires mod with mobility secondary to muscle weakness, decreased coordination and decreased standing balance, decreased postural control and decreased balance strategies.  Prior to hospitalization, patient was independent  with mobility and lived with Significant other (boyfriend) in a House home.  Home access is 1Stairs to enter.  Patient will benefit from skilled PT intervention to maximize safe functional mobility, minimize fall risk and decrease caregiver burden for planned discharge home with intermittent assist.  Anticipate patient will benefit from follow up [facility] at discharge.  PT - End of Session Activity Tolerance: Tolerates 30+ min activity with multiple rests Endurance Deficit: Yes Endurance Deficit Description: requries rest breaks during functional tasks PT Assessment Rehab Potential (ACUTE/IP ONLY): Good PT Barriers to Discharge:  Decreased caregiver support;Medical stability PT Patient demonstrates impairments in the following area(s): Endurance;Motor;Balance;Pain;Sensory PT Transfers Functional Problem(s): Bed Mobility;Bed to Chair;Car;Furniture;Floor PT Locomotion Functional Problem(s): Ambulation;Wheelchair Mobility;Stairs PT Plan PT Intensity: Minimum of 1-2 x/day ,45 to 90 minutes PT Frequency: 5 out of 7 days PT Duration Estimated Length of Stay: 10-14 days PT Treatment/Interventions: Ambulation/gait training;Balance/vestibular training;Community reintegration;Discharge planning;DME/adaptive equipment instruction;Functional electrical stimulation;Functional mobility training;Neuromuscular re-education;Pain management;Patient/family education;Skin care/wound management;Stair training;Therapeutic Activities;Splinting/orthotics;UE/LE Coordination activities;Therapeutic Exercise;UE/LE Strength taining/ROM;Wheelchair propulsion/positioning PT Transfers Anticipated Outcome(s): mod I PT Locomotion Anticipated Outcome(s): mod I PT Recommendation Follow Up Recommendations: Home health PT;Outpatient PT Patient destination: Home Equipment Recommended: To be determined  Skilled Therapeutic Intervention Pt participated in skilled PT eval and was educated on PT POC and goals.  Pt performed bed mobility with min A for trunk control.  Stand pivot transfers with mod A.  Gait without AD with mod A 10' with wide BOS, decreased DF for foot clearance.  Gait with RW 2 x 50' with min A with min A for trunk and hip control, cues for foot clearance.  Curb step training 2 steps x 2 with RW and min A, cues for technique.  Simulated car transfer to sedan height car  with min A for transfer with RW, pt able to lift LEs into car using UEs.  Pt back to bed with min A for transfer with use of RW.  PT Evaluation Precautions/Restrictions Precautions Precautions: Fall Restrictions Weight Bearing Restrictions: No Pain Pain Assessment Faces Pain  Scale: Hurts little more Pain Type: Chronic pain Pain Location: Leg Pain Orientation: Right;Left;Upper Pain Descriptors / Indicators: Burning Pain Onset: On-going Pain Intervention(s): RN made aware Multiple Pain Sites: No Home Living/Prior Functioning Home Living Available Help at Discharge: Family;Friend(s);Available PRN/intermittently Type of Home: House Home Access: Stairs to enter Entrance Stairs-Number of Steps: 1 Home Layout: One level  Lives With: Significant other (boyfriend) Prior Function Level of Independence: Independent with basic ADLs;Independent with homemaking with ambulation;Independent with gait  Able to Take Stairs?: Yes Driving: Yes Vocation: Part time employment Comments: prior to her fall ~3 weeks ago pt was independent (including working); since then she has had great difficulty with ambulating inside her home and performing ADLs.  Cognition Overall Cognitive Status: Within Functional Limits for tasks assessed Arousal/Alertness: Awake/alert Sensation Sensation Light Touch: Impaired Detail Light Touch Impaired Details: Impaired RLE;Impaired LLE Proprioception: Impaired Detail Proprioception Impaired Details: Impaired RLE;Impaired LLE Coordination Gross Motor Movements are Fluid and Coordinated: No Coordination and Movement Description: pt with difficulty coordinating turns during gait training Motor  Motor Motor: Abnormal postural alignment and control   Trunk/Postural Assessment  Cervical Assessment Cervical Assessment: Within Functional Limits Thoracic Assessment Thoracic Assessment: Within Functional Limits Lumbar Assessment Lumbar Assessment: Within Functional Limits Postural Control Postural Control: Deficits on evaluation Righting Reactions: delayed  Balance Balance Balance Assessed: Yes Dynamic Standing Balance Dynamic Standing - Comments: mod/max A to maintain without AD Extremity Assessment      RLE Assessment RLE Assessment:   (grossly 3-/5) LLE Assessment LLE Assessment:  (grossly 3/5)   See Function Navigator for Current Functional Status.   Refer to Care Plan for Long Term Goals  Recommendations for other services: None   Discharge Criteria: Patient will be discharged from PT if patient refuses treatment 3 consecutive times without medical reason, if treatment goals not met, if there is a change in medical status, if patient makes no progress towards goals or if patient is discharged from hospital.  The above assessment, treatment plan, treatment alternatives and goals were discussed and mutually agreed upon: by patient  Roddrick Sharron 11/23/2016, 2:42 PM

## 2016-11-23 NOTE — Progress Notes (Signed)
VASCULAR LAB PRELIMINARY  PRELIMINARY  PRELIMINARY  PRELIMINARY  Bilateral lower extremity venous duplex completed.    Preliminary report:  There is no DVT or SVT noted in the bilateral lower extremities.   Danniel Grenz, RVT 11/23/2016, 12:13 PM

## 2016-11-23 NOTE — Progress Notes (Signed)
[name] Admission Coordinator Signed Physical Medicine and Rehabilitation  PMR Pre-admission Date of Service: 11/22/2016 11:01 AM  Related encounter: ED to Hosp-Admission (Discharged) from 11/15/2016 in [facility]       '[]'$ Hide copied text PMR Admission Coordinator Pre-Admission Assessment  Patient:Debra [name] an 51 y.o., female ZDG:[id number] DOB:06/08/1965 Height:'5\' 4"'$  (162.6 cm) Weight:76.2 kg (167 lb 15.9 oz)  Insurance Information HMO: PPO: PCP: IPA:80/20: OTHER:  PRIMARY: None, uninsured Policy#: Subscriber:  CM Name: Phone#: Fax#:  Pre-Cert#: Employer:  Benefits: Phone #: Name:  Eff. Date: Deduct: Out of Pocket Max: Life Max:  CIR: SNF:  Outpatient:Co-Pay:  Home Health: Co-Pay:  DME: Co-Pay:  Providers:   Has been seen by Debra [name]  Medicaid Application Date:Case Manager: Disability Application Date:Case Worker:  Emergency Contact Information        Contact Information   Name Relation Home Work Debra [name] [phone]       Current Medical History Patient Admitting Diagnosis: LE weakness History of Present Illness: Debra [name] a 51 y.o.right handed femalewith history of CVA 1990 as well as gait dysfunction and patient was noncompliant with medical follow-up, type 1 diabetes mellitus with neuropathy, DKA, fibromyalgia. Per chart review patient lives with her boyfriend. Boyfriend works during the day and no other local family can assist. One level home with 1 step to entry. She was independent until approximately 3 weeks ago. Presented 11/15/2016 with progressive weakness bilateral lower extremities as well as back pain over the past 2 months and difficulty with ambulation as well as associated diarrhea. Noted sodium 127, creatinine 2.72.UDS positive opiates.  Troponin 0.47 felt to be secondary to demand ischemia. CT abdomen and chest x-ray unremarkable. Glucose was 998 she was started on insulin drip. MRI with thoracic lesion. Per reports, MRI lumbar and thoracic showed no canal stenosis or neural foraminal narrowing however there was some paraspinal muscle edema. Thoracic MRI showed a 3 x 13 mm intramedullary nonenhancing lesionT10 level. Serum CK was normal.Neurology did not recommend any further workup in regards to reported suspect lesion as there was no mass effect and cauda equina syndrome was ruled out. ESR and CK were normal.MRI 11/21/2016 of the brain completed showing multifocal cerebral white matter changes in a distribution most consistent with demyelinating disease, multiple sclerosis. No other acute intracranial abnormality. MRI cervical spine 11/21/2016 showed patchy cord signal abnormality extending from the craniocervical junction through the upper thoracic spine again consistent with demyelinating disease. Evidence for active demyelination at the level of C1-2 and C6.neurology was contacted in regards to latest MRI findings and placed on IV Solu-Medrol for anticipation of 5 days. Initial plan for lumbar puncture but later discontinued after her MRI findings. Bouts of urinary retention with bladder scan 999 on 11/22/2016.Subcutaneous heparin for DVT prophylaxis. Physical occupational therapy evaluations completed. Recommendations were later made for physical medicine rehabilitation consult. Patient was admitted for a comprehensive rehabilitation program 11/22/16.   Past Medical History     Past Medical History:  Diagnosis Date  . Adenomatous colonic polyps   . Anemia    2005  . Anxiety    1990  . Arthritis   . Asthma    2000  . Cataract   . Depression 1990  . Difficult intubation    narrow airway  . Gastroesophageal Reflux Disease (GERD)   . Heart murmur    Birth  . Hyperlipidemia    2005  . Hypertension      1998  .  Internal hemorrhoids 04/24/06   on colonoscopy  . Migraine   . Neuropathy of the hands & feet   . Restless Leg Syndrome   . Right Ankle Fracture 10/06/2013  . Sleep paralysis   . Stable angina pectoris    2007: cath showing normal cors.   . Stroke 1990  . Type I Diabetes Mellitus 1988    Family History family history includes Breast cancer in her maternal grandmother; Colon cancer in her maternal uncle; Hypertension in her father and mother; Kidney disease in her mother; Ovarian cancer in her paternal grandmother; Prostate cancer in her maternal grandfather and paternal grandfather.  Prior Rehab/Hospitalizations: Has the patient had major surgery during 100 days prior to admission? No  Current Medications   Current Facility-Administered Medications:  . acetaminophen (TYLENOL) tablet 650 mg, 650 mg, Oral, Q6H PRN, 650 mg at 11/20/16 2014 **OR** acetaminophen (TYLENOL) suppository 650 mg, 650 mg, Rectal, Q6H PRN, Burgess Estelle, MD . amLODipine (NORVASC) tablet 10 mg, 10 mg, Oral, Daily, Burgess Estelle, MD, 10 mg at 11/21/16 0854 . atorvastatin (LIPITOR) tablet 40 mg, 40 mg, Oral, Daily, Burgess Estelle, MD, 40 mg at 11/21/16 0854 . diclofenac sodium (VOLTAREN) 1 % transdermal gel 2 g, 2 g, Topical, BID, Santos-Sanchez, Idalys, MD, 2 g at 11/21/16 0942 . feeding supplement (GLUCERNA SHAKE) (GLUCERNA SHAKE) liquid 237 mL, 237 mL, Oral, TID BM, Santos-Sanchez, Idalys, MD, 237 mL at 11/21/16 0942 . gabapentin (NEURONTIN) capsule 600 mg, 600 mg, Oral, TID, Santos-Sanchez, Idalys, MD, 600 mg at 11/21/16 0854 . heparin injection 5,000 Units, 5,000 Units, Subcutaneous, Q8H, Ledell Noss, MD, 5,000 Units at 11/21/16 0539 . insulin aspart (novoLOG) injection 0-9 Units, 0-9 Units, Subcutaneous, Q4H, Burgess Estelle, MD, 2 Units at 11/21/16 [phone] . insulin aspart (novoLOG) injection 6 Units, 6 Units, Subcutaneous, TID WC, Santos-Sanchez, Idalys, MD, 6 Units at 11/21/16  0851 . insulin glargine (LANTUS) injection 25 Units, 25 Units, Subcutaneous, Daily, Welford Roche, MD, 25 Units at 11/21/16 0350 . ketorolac (TORADOL) 30 MG/ML injection 30 mg, 30 mg, Intravenous, Q6H, Burgess Estelle, MD, 30 mg at 11/21/16 0539 . lactated ringers bolus 1,000 mL, 1,000 mL, Intravenous, Once, Geiple, Joshua, PA-C . lisinopril (PRINIVIL,ZESTRIL) tablet 40 mg, 40 mg, Oral, Daily, Santos-Sanchez, Idalys, MD, 40 mg at 11/21/16 0854 . metoprolol succinate (TOPROL-XL) 24 hr tablet 50 mg, 50 mg, Oral, Daily, Burgess Estelle, MD, 50 mg at 11/21/16 0854 . promethazine (PHENERGAN) tablet 12.5 mg, 12.5 mg, Oral, Q6H PRN, Burgess Estelle, MD  Patients Current Diet:Diet Carb Modified Fluid consistency: Thin; Room service appropriate? Yes  Precautions / Restrictions Precautions Precautions: Fall Restrictions Weight Bearing Restrictions: Yes  Has the patient had 2 or more falls or a fall with injury in the past year?Yes, 10+ with no injuries per her report   Prior Activity Level Community (5-7x/wk): Prior to fall about 3 weeks ago patient was independent, driving, and working. Since then she has declined to requiring assist with basic self care tasks. She wants to regain her independence.   Home Assistive Devices / Equipment Home Assistive Devices/Equipment: Kasandra Knudsen (specify quad or straight) Home Equipment: Walker - 2 wheels, Wheelchair - manual, [employer] - single point  Prior Device Use: Indicate devices/aids used by the patient prior to current illness, exacerbation or injury? None of the above, prior to 3 weeks ago, then she declined to needing a wheelchair at home  Prior Functional Level Prior Function Level of Independence: Independent Comments: prior to her fall ~3 weeks ago pt was independent (including working); since  then she has had great difficulty with ambulating inside her home and performing ADLs.  Self Care: Did the patient need help bathing,  dressing, using the toilet or eating? Independent  Indoor Mobility: Did the patient need assistance with walking from room to room (with or without device)? Independent  Stairs: Did the patient need assistance with internal or external stairs (with or without device)? Independent  Functional Cognition: Did the patient need help planning regular tasks such as shopping or remembering to take medications? Independent  Current Functional Level Cognition  Overall Cognitive Status: Within Functional Limits for tasks assessed Orientation Level: Oriented X4 General Comments: cognition not formally assessed but WNL for general conversation   Extremity Assessment (includes Sensation/Coordination)  Upper Extremity Assessment: Generalized weakness Lower Extremity Assessment: Defer to PT evaluation RLE Deficits / Details: MMT revealed 2/5 for hip flexion, knee extension, knee flexion and ankle DF. Pt very limited functionally as well. Pt with impaired sensation to light touch throughout. RLE: Unable to fully assess due to pain RLE Sensation: decreased light touch RLE Coordination: decreased fine motor, decreased gross motor LLE Deficits / Details: MMT revealed 2/5 for hip flexion, knee flexion, knee extension and ankle DF. Pt very limited functionally as well. Pt with impaired sensation to light touch throughout. LLE: Unable to fully assess due to pain LLE Sensation: decreased light touch LLE Coordination: decreased fine motor, decreased gross motor   ADLs  Overall ADL's : Needs assistance/impaired Eating/Feeding: Set up, Bed level Grooming: Min guard, Sitting Grooming Details (indicate cue type and reason): Sitting EOB pt required Min Guard A due to poor balance and feeling dizzy. Pt requiring Mod-Max A to correct balance during single LOB while at EOB (posterior lean). Upper Body Bathing: Minimal assistance, Sitting Lower Body Bathing: Moderate assistance, Sit to/from stand Lower  Body Bathing Details (indicate cue type and reason): Pt cleaned up after BM in bed with Mod A. Pt performing sit<>Stand with Mod A and then maintain standing with Min A and RW. Pt abel to reach back to perform toilet hygiene. However, requires assistance to make sure she was clean.  Upper Body Dressing : Minimal assistance, Sitting Lower Body Dressing: Maximal assistance, Sit to/from stand Lower Body Dressing Details (indicate cue type and reason): Pt attempt to adjust socks but became dizzy. Functional mobility during ADLs: Moderate assistance, Rolling walker, Cueing for sequencing, Cueing for safety (sit<>Stand only) General ADL Comments: Pt demonstrating decreased functional performance and requiring Min A for UB ADLs and Mod-max for LB ADLs. Pt limited by pain, fatigue, and dizziness.    Mobility  Overal bed mobility: Needs Assistance Bed Mobility: Rolling, Sidelying to Sit, Sit to Sidelying Rolling: Min guard Sidelying to sit: Min guard Sit to sidelying: Min assist General bed mobility comments: Pt seated in recliner on arrival.    Transfers  Overall transfer level: Needs assistance Equipment used: Rolling walker (2 wheeled) Transfers: Sit to/from Stand Sit to Stand: Mod assist General transfer comment: Assist to boost into standing with cues for hand placement to and from seated surface. Pt with improved technique from previous standing trials. In standing patient performed weight shifting and marching in place.    Ambulation / Gait / Stairs / Wheelchair Mobility  Ambulation/Gait Ambulation/Gait assistance: Min assist, Mod assist Ambulation Distance (Feet): 60 Feet Assistive device: Rolling walker (2 wheeled) Gait Pattern/deviations: Step-to pattern, Decreased stride length, Trunk flexed, Decreased dorsiflexion - right, Wide base of support General Gait Details: Pt performed weight shifting and marching pre gait and tolerated  well. During gait training patient presents  with forward flexion and buckling in R knee. Pt required cues for hip extension, quad control and B heel strike.  Gait velocity interpretation: Below normal speed for age/gender   Posture / Balance Dynamic Sitting Balance Sitting balance - Comments: single LOB during sitting Balance Overall balance assessment: Needs assistance Sitting-balance support: Feet supported, No upper extremity supported Sitting balance-Leahy Scale: Fair Sitting balance - Comments: single LOB during sitting Postural control: Posterior lean Standing balance support: Bilateral upper extremity supported, During functional activity Standing balance-Leahy Scale: Poor Standing balance comment: Reliant on BUE on RW   Special needs/care consideration BiPAP/CPAP: no CPM: No Continuous Drip IV: No Dialysis: No Life Vest: No Oxygen: No Special Bed: No Trach Size: No Wound Vac (area): No Skin: WDL, dry, and patient reports skin discoloration in her calves  Bowel mgmt: Intermittent incontinence, last BM 11/19/16 Bladder mgmt: Acute urinary retention, requiring in and out catheterizations  Diabetic mgmt: Yes type 1, managed with CBG checks and sliding scale insulin per patient report      Previous Home Environment Living Arrangements: Spouse/significant other Available Help at Discharge: Family, Friend(s), Available PRN/intermittently Type of Home: House Home Layout: One level Home Access: Stairs to enter Technical brewer of Steps: 1 Bathroom Shower/Tub: Tub/shower unit, Charity fundraiser: Handicapped height Home Care Services: No  Discharge Living Setting Plans for Discharge Living Setting: Patient's home, Lives with (comment) (boyfriend ) Type of Home at Discharge: House Discharge Home Layout: One level Discharge Home Access: Stairs to enter Entrance Stairs-Rails: None Entrance Stairs-Number of Steps: 1 Discharge Bathroom Shower/Tub: Tub/shower unit, Door Discharge  Bathroom Toilet: Handicapped height Discharge Bathroom Accessibility: Yes How Accessible: Accessible via walker Does the patient have any problems obtaining your medications?: Yes (Describe) (she is uninsured )  Social/Family/Support Systems Patient Roles: Other (Comment) (significant other ) Anticipated Caregiver: Boyfriend PRN, he works 12 hour shifts  Ability/Limitations of Caregiver: He works 12 hour shifts and is only able to assist at night  Caregiver Availability: Intermittent Discharge Plan Discussed with Primary Caregiver: No (discussed with patient ) Is Caregiver In Agreement with Plan?: (Patient in agreement with plan ) Does Caregiver/Family have Issues with Lodging/Transportation while Pt is in Rehab?: No  Goals/Additional Needs Patient/Family Goal for Rehab: PT/OT Supervision-Min A Expected length of stay: 17-20 days  Cultural Considerations: None Dietary Needs: Carb. Mod. diet restrictions  Equipment Needs: TBD Special Service Needs: Patient has been in contact with her financial counselor and is monitoring if she is a Medicaid candidate  Additional Information: None Pt/Family Agrees to Admission and willing to participate: Yes Program Orientation Provided &Reviewed with Pt/Caregiver Including Roles &Responsibilities: Yes Additional Information Needs: Wants aunt to remain as her emergency contact  Information Needs to be Provided By: FYI Barriers to Discharge: Decreased caregiver support, Neurogenic Bowel &Bladder  Decrease burden of Care through IP rehab admission: No  Possible need for SNF placement upon discharge:No  Patient Condition:This patient's medical and functional status has changed since the consult dated 9/25/18in which the Rehabilitation Physician determined and documented that the patient was potentially appropriate for intensive rehabilitative care in an inpatient rehabilitation facility. Issues have been addressed and update has been  discussed with Dr. Roe Rutherford patient now appropriate for inpatient rehabilitation. Will admit to inpatient rehab today.   Preadmission Screen Completed By: Debra [name], 9/26/201811:02 AM ______________________________________________________________________  Discussed status with Dr. Posey Pronto on 11/22/16 at 1102and received telephone approval for admission today.  Admission Coordinator: Debra [name], time 1102/Date 11/22/16

## 2016-11-24 ENCOUNTER — Inpatient Hospital Stay (HOSPITAL_COMMUNITY): Payer: Self-pay | Admitting: Physical Therapy

## 2016-11-24 ENCOUNTER — Inpatient Hospital Stay (HOSPITAL_COMMUNITY): Payer: Self-pay | Admitting: Occupational Therapy

## 2016-11-24 LAB — GLUCOSE, CAPILLARY
GLUCOSE-CAPILLARY: 209 mg/dL — AB (ref 65–99)
GLUCOSE-CAPILLARY: 336 mg/dL — AB (ref 65–99)
GLUCOSE-CAPILLARY: 234 mg/dL — AB (ref 65–99)
Glucose-Capillary: 102 mg/dL — ABNORMAL HIGH (ref 65–99)

## 2016-11-24 MED ORDER — PANTOPRAZOLE SODIUM 40 MG PO TBEC
40.0000 mg | DELAYED_RELEASE_TABLET | Freq: Every day | ORAL | Status: DC
  Administered 2016-11-25 – 2016-12-15 (×21): 40 mg via ORAL
  Filled 2016-11-24 (×21): qty 1

## 2016-11-24 NOTE — Care Management Note (Signed)
[facility]patient [facility] Individual Statement of Services  Patient Name:  Debra [name]  Date:  11/24/2016  Welcome to the [facility]patient [facility].  Our goal is to provide you with an individualized program based on your diagnosis and situation, designed to meet your specific needs.  With this comprehensive rehabilitation program, you will be expected to participate in at least 3 hours of rehabilitation therapies Monday-Friday, with modified therapy programming on the weekends.  Your rehabilitation program will include the following services:  Physical Therapy (PT), Occupational Therapy (OT), 24 hour per day rehabilitation nursing, Therapeutic Recreaction (TR), Neuropsychology, Case Management (Social Worker), Rehabilitation Medicine, Nutrition Services and Pharmacy Services  Weekly team conferences will be held on Wednesdays to discuss your progress.  Your Social Worker will talk with you frequently to get your input and to update you on team discussions.  Team conferences with you and your family in attendance may also be held.  Expected length of stay: 10 days  Overall anticipated outcome: modified independent  Depending on your progress and recovery, your program may change. Your Social Worker will coordinate services and will keep you informed of any changes. Your Social Worker's name and contact numbers are listed  below.  The following services may also be recommended but are not provided by the Inpatient Rehabilitation Center:   Driving Evaluations  Home Health Rehabiltiation Services  Outpatient Rehabilitation Services  Vocational Rehabilitation   Arrangements will be made to provide these services after discharge if needed.  Arrangements include referral to agencies that provide these services.  Your insurance has been verified to be:  None (will pursue start of Medicaid and Disability applications while here.) Your primary doctor is:  Nyra Market  Pertinent  information will be shared with your doctor and your insurance company.  Social Worker:  [place], [name] [phone] or ([phone]   Information discussed with and copy given to patient by: Amada Jupiter, 11/24/2016, 3:14 PM

## 2016-11-24 NOTE — Progress Notes (Signed)
Occupational Therapy Session Note  Patient Details  Name: Debra [name] MRN: [id number] Date of Birth: [birth year]  Today's Date: 11/24/2016 OT Individual Time: 1346-1415 OT Individual Time Calculation (min): 29 min    Short Term Goals: Week 1:  OT Short Term Goal 1 (Week 1): STG = LTGs due to ELOS  Skilled Therapeutic Interventions/Progress Updates:    Treatment focus on d/c planning and functional transfers. Beginning of session, therapist discussed home set-up of tub/shower and options that will promote independence and safety when d/c home. Pt agreeable to practice tub bench shower transfers. Squat pivot transfer w/c <> tub bench in ADL bathroom completed with therapist providing min A and mod instructional cues for technique. Therapist demonstrating stand pivot transfer with use of RW as option if appropriate at d/c. Pt reporting tub/shower has door and unsure if doors will be able to be removed at this time; therapist continues to recommend tub bench as safest option at this time. Pt returned to room with all needs in reach.  Therapy Documentation Precautions:  Precautions Precautions: Fall Restrictions Weight Bearing Restrictions: No General:   Vital Signs: Therapy Vitals Temp: 97.6 F (36.4 C) Temp Source: Oral Pulse Rate: 82 Resp: 18 BP: (!) 180/75 Patient Position (if appropriate): Lying Oxygen Therapy SpO2: 100 % O2 Device: Not Delivered Pain:   C/o pain in RLE from previous therapy sessions but willing to participate in therapy.  See Function Navigator for Current Functional Status.   Therapy/Group: Individual Therapy  [name] 11/24/2016, 3:07 PM
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
## 2016-11-24 NOTE — Progress Notes (Signed)
Occupational Therapy Session Note  Patient Details  Name: Debra [name] MRN: [id number] Date of Birth: Jul 27, 1965  Today's Date: 11/24/2016 OT Individual Time: 6147-0929 OT Individual Time Calculation (min): 70 min    Short Term Goals: Week 1:  OT Short Term Goal 1 (Week 1): STG = LTGs due to ELOS  Skilled Therapeutic Interventions/Progress Updates:    Treatment focus on pt education of disease prevention/management, BUE coordination, LB dressing sit > stand level, and dynamic standing balance. Pt requesting to stay in room due to pain from previous therapy session. Therapist provided pt with handouts for education of diagnosis upon pt request. Engaged in peg board activity EOB sit > stand level to challenge BUE coordination, sitting balance, and sit > stand to facilitate performance skills required for basic self-care. Therapist proving mod A initially for sit > stand and min A during standing. Simulated LB dressing EOB sit > stand level with use of RW for UE support. Therapist noted improvement with sitting balance and LB dressing with pt able to complete tasks more independently. Pt completed LB dressing task requiring min/mod A for sit > stand. Engaged pt in functional task (folding laundry) to challenge dynamic standing balance required for basic ADLs. Standing activity facilitated trunk rotation, trunk flexion/extension, crossing midline, and weight shifting through BLE. Therapist provided min A during task and mod A with pt LOB x1. EOB > w/c stand pivot transfer without RW with min/mod A from therapist. Pt with all needs in reach at end of session reporting continued pain in RLE.  Therapy Documentation Precautions:  Precautions Precautions: Fall Restrictions Weight Bearing Restrictions: No General:   Vital Signs: Therapy Vitals Temp: 98.1 F (36.7 C) Temp Source: Oral Pulse Rate: 80 Resp:  (16) BP: (!) 186/82 Patient Position (if appropriate): Lying Oxygen Therapy SpO2: 100  % O2 Device: Not Delivered Pain: C/o of lower back and RLE pain rating 10/10 reporting RN administered pain medication already and pt agreeable to therapy in room at [facility]  See Function Navigator for Current Functional Status.   Therapy/Group: Individual Therapy  [name] 11/24/2016, 12:26 PM

## 2016-11-24 NOTE — Progress Notes (Addendum)
Physical Therapy Session Note  Patient Details  Name: Debra [name] MRN: [id number] Date of Birth: 1965/09/03  Today's Date: 11/24/2016 PT Individual Time: 1415-1445 PT Individual Time Calculation (min): 30 min   Short Term Goals: Week 1:  PT Short Term Goal 1 (Week 1): pt will consistently perform transfers with supervision PT Short Term Goal 2 (Week 1): pt will perform gait 50' in controlled environment with supervision  Skilled Therapeutic Interventions/Progress Updates:    c/o pain and soreness in R quad and back from fall previously.  Session focus on pain control, pt education, and core strengthening.  PT propelled pt to and from therapy gym for time management and energy conservation.  Pt completes 3 rounds of 5 reps reaching outside BOS laterally and across midline for weighted targets for core control and strengthening.  PT provided education to pt for use of ice versus heat 2/2 MS diangosis.  Left positioned in bed with call bell in reach and needs met at end of session.   Therapy Documentation Precautions:  Precautions Precautions: Fall Restrictions Weight Bearing Restrictions: No   See Function Navigator for Current Functional Status.   Therapy/Group: Individual Therapy  Michel Santee 11/24/2016, 5:21 PM

## 2016-11-24 NOTE — Progress Notes (Addendum)
[facility] PHYSICAL MEDICINE & REHABILITATION     PROGRESS NOTE    Subjective/Complaints: Therapy went well yesterday. Walked. Got in car  Review of Systems  Constitutional: Negative for fever.  Eyes: Negative for double vision.  Respiratory: Negative for cough.   Cardiovascular: Negative for chest pain.  Gastrointestinal: Negative for heartburn.  Genitourinary: Negative for dysuria.  Neurological: Positive for sensory change and focal weakness.  Psychiatric/Behavioral: Negative for depression.   n   Objective: Vital Signs: Blood pressure (!) 186/82, pulse 80, temperature 98.1 F (36.7 C), temperature source Oral, resp. rate 17, height  (1.626 m), weight 76 kg (167 lb 8.8 oz), SpO2 100 %. No results found.  Recent Labs  11/22/16 1941  WBC 16.7*  HGB 13.0  HCT 39.3  PLT 303    Recent Labs  11/22/16 0356 11/22/16 1941 11/23/16 0215  NA 137 132*  --   K 4.1 4.7  --   CL 104 97*  --   GLUCOSE 122* 284* 377*  BUN 20 28*  --   CREATININE 1.27* 1.28*  --   CALCIUM 8.7* 9.3  --    CBG (last 3)   Recent Labs  11/23/16 2011 11/23/16 2142 11/24/16 0656  GLUCAP 360* 313* 102*    Wt Readings from Last 3 Encounters:  11/22/16 76 kg (167 lb 8.8 oz)  11/18/16 76.2 kg (167 lb 15.9 oz)  11/07/16 68.4 kg (150 lb 12.8 oz)    Physical Exam:  Constitutional: She is oriented to person, place, and time. She appears well-developed and well-nourished.  HENT:  Head: Normocephalic and atraumatic.  Eyes: EOM are normal. Right eye exhibits no discharge. Left eye exhibits no discharge.  Neck: Normal range of motion. Neck supple. No thyromegaly present.  Cardiovascular: RRR without murmur. No JVD     Respiratory: CTA Bilaterally without wheezes or rales. Normal effort .  GI: soft NT  Musculoskeletal: She exhibits no edema or tenderness.  Neurological: She is alert and oriented to person, place, and time.  Motor: B/l UE 5/5 proximal to distal RLE: HF, KE 3/5, ADF/PF 3  to 3+/5 LLE: HF, KE 4-/5, ADF/PF 4/5 DTRs 2+ Sensation diminished to light touch in bilateral lower ext as well as distal arms.   Skin: Skin is warm and dry.  Psychiatric: pleasant  Assessment/Plan: 1. Functional and neurological deficits secondary to multiple sclerosis which require 3+ hours per day of interdisciplinary therapy in a comprehensive inpatient rehab setting. Physiatrist is providing close team supervision and 24 hour management of active medical problems listed below. Physiatrist and rehab team continue to assess barriers to discharge/monitor patient progress toward functional and medical goals.  Function:  Bathing Bathing position   Position: Wheelchair/chair at sink  Bathing parts Body parts bathed by patient: Right arm, Left arm, Chest, Abdomen, Right upper leg Body parts bathed by helper: Buttocks, Right lower leg, Left lower leg, Back  Bathing assist Assist Level: Touching or steadying assistance(Pt > 75%) (Mod assist)      Upper Body Dressing/Undressing Upper body dressing   What is the patient wearing?: Pull over shirt/dress     Pull over shirt/dress - Perfomed by patient: Thread/unthread right sleeve, Thread/unthread left sleeve, Pull shirt over trunk, Put head through opening          Upper body assist Assist Level: Touching or steadying assistance(Pt > 75%)      Lower Body Dressing/Undressing Lower body dressing   What is the patient wearing?: Pants, Shoes, Non-skid slipper socks  Pants- Performed by patient: Thread/unthread left pants leg Pants- Performed by helper: Thread/unthread right pants leg, Pull pants up/down Non-skid slipper socks- Performed by patient: Don/doff left sock Non-skid slipper socks- Performed by helper: Don/doff right sock     Shoes - Performed by patient: Don/doff left shoe Shoes - Performed by helper: Don/doff right shoe          Lower body assist Assist for lower body dressing:  (Max assist)       Toileting Toileting   Toileting steps completed by patient: Adjust clothing prior to toileting, Adjust clothing after toileting   Toileting Assistive Devices: Grab bar or rail  Toileting assist Assist level: Touching or steadying assistance (Pt.75%)   Transfers Chair/bed transfer   Chair/bed transfer method: Ambulatory Chair/bed transfer assist level: Moderate assist (Pt 50 - 74%/lift or lower)       Locomotion Ambulation     Max distance: 10 Assist level: Moderate assist (Pt 50 - 74%)   Wheelchair   Type: Manual Max wheelchair distance: 50 Assist Level: Supervision or verbal cues  Cognition Comprehension Comprehension assist level: Follows complex conversation/direction with no assist  Expression Expression assist level: Expresses complex ideas: With no assist  Social Interaction Social Interaction assist level: Interacts appropriately with others - No medications needed.  Problem Solving Problem solving assist level: Solves basic problems with no assist  Memory Memory assist level: More than reasonable amount of time   Medical Problem List and Plan: 1.  Decreased functional mobility with bilateral lower extremity weakness secondary to active demyelinating disease/multiple sclerosis.  -Intravenous Solu-Medrol initiated 11/21/2016  -continue therapies. Pt ismotivated 2.  DVT Prophylaxis/Anticoagulation: subcutaneous heparin. Monitor platelet counts any signs of bleeding 3. Pain Management/fibromyalgia: Neurontin 600 mg 3 times a day, Voltaren gel, oxycodone as needed for dysesthetic pain  -reviewed etiology of pain with patient.  4. Mood: provide emotional support 5. Neuropsych: This patient his capable of making decisions on her own behalf. 6. Skin/Wound Care: Routine skin checks 7. Fluids/Electrolytes/Nutrition: Routine I&O with follow-up chemistries reviewed personally  -recheck bmet Saturday  -holding IVF for now 8.Hypertension.Norvasc 10 mg daily, Toprol 50 mg  daily, lisinopril 40 mg daily 9.Diabetes mellitus of peripheral neuropathy.Latest hemoglobin A1c of 14.Lantus insulin 28 units daily, NovoLog 8 units 3 times a day..   -  lantus  25 units bid  -some improvement already today 10.Hyperlipidemia. Lipitor 11.Urinary retention. Remove foley  -voiding trial,  pvr's  -cath prn 12.Medical noncompliance. Provide counseling   LOS (Days) 2 A FACE TO FACE EVALUATION WAS PERFORMED  Ranelle Oyster, MD 11/24/2016 10:27 AM

## 2016-11-24 NOTE — Progress Notes (Signed)
Physical Therapy Note  Patient Details  Name: TAWANDA BARKIN MRN: [id number] Date of Birth: 05-16-1965 Today's Date: 11/24/2016    Time: 830-924 54 minutes  1:1 No c/o pain.  Pt performed bed mobility from level bed without railings to simulate home environment with supervision for supine to sit, min A for sit to supine.  Standing balance for LB dressing with min A.  Squat pivot transfers at beginning of session min/mod A, at end of session max A for transfers due to fatigue. Standing balance without UE support with wt shifts with min A. Sit to stand repetitions without UE support with min/mod A.  Supine bridging, hip abd/add, saq with focus on eccentric control, heel slides all with AAROM. Gait with RW 80' x 3 with min A. On last attempt at gait with with assisted fall due to knees buckling and pt unable to stand despite max A.  Pt back to w/c with maxi move, RN present and aware.  Lashun Mccants 11/24/2016, 9:25 AM

## 2016-11-25 ENCOUNTER — Inpatient Hospital Stay (HOSPITAL_COMMUNITY): Payer: Self-pay

## 2016-11-25 ENCOUNTER — Inpatient Hospital Stay (HOSPITAL_COMMUNITY): Payer: Self-pay | Admitting: Physical Therapy

## 2016-11-25 ENCOUNTER — Inpatient Hospital Stay (HOSPITAL_COMMUNITY): Payer: Self-pay | Admitting: Occupational Therapy

## 2016-11-25 LAB — BASIC METABOLIC PANEL
Anion gap: 5 (ref 5–15)
BUN: 37 mg/dL — ABNORMAL HIGH (ref 6–20)
CHLORIDE: 104 mmol/L (ref 101–111)
CO2: 26 mmol/L (ref 22–32)
Calcium: 9 mg/dL (ref 8.9–10.3)
Creatinine, Ser: 1.44 mg/dL — ABNORMAL HIGH (ref 0.44–1.00)
GFR, EST AFRICAN AMERICAN: 49 mL/min — AB (ref >60)
GFR, EST NON AFRICAN AMERICAN: 42 mL/min — AB (ref >60)
GLUCOSE: 118 mg/dL — AB (ref 65–99)
Potassium: 4.6 mmol/L (ref 3.5–5.1)
Sodium: 135 mmol/L (ref 135–145)

## 2016-11-25 LAB — GLUCOSE, CAPILLARY
GLUCOSE-CAPILLARY: 87 mg/dL (ref 65–99)
GLUCOSE-CAPILLARY: 137 mg/dL — AB (ref 65–99)

## 2016-11-25 MED ORDER — INSULIN GLARGINE 100 UNIT/ML ~~LOC~~ SOLN
35.0000 [IU] | Freq: Every day | SUBCUTANEOUS | Status: DC
  Administered 2016-11-26 – 2016-12-04 (×9): 35 [IU] via SUBCUTANEOUS
  Filled 2016-11-25 (×11): qty 0.35

## 2016-11-25 MED ORDER — INSULIN GLARGINE 100 UNIT/ML ~~LOC~~ SOLN
25.0000 [IU] | Freq: Every day | SUBCUTANEOUS | Status: DC
  Administered 2016-11-25: 25 [IU] via SUBCUTANEOUS
  Filled 2016-11-25: qty 0.25

## 2016-11-25 MED ORDER — METOPROLOL SUCCINATE ER 25 MG PO TB24
25.0000 mg | ORAL_TABLET | Freq: Once | ORAL | Status: AC
  Administered 2016-11-25: 25 mg via ORAL
  Filled 2016-11-25: qty 1

## 2016-11-25 MED ORDER — INSULIN GLARGINE 100 UNIT/ML ~~LOC~~ SOLN
10.0000 [IU] | Freq: Once | SUBCUTANEOUS | Status: AC
  Administered 2016-11-25: 10 [IU] via SUBCUTANEOUS
  Filled 2016-11-25 (×2): qty 0.1

## 2016-11-25 MED ORDER — METOPROLOL SUCCINATE ER 50 MG PO TB24
75.0000 mg | ORAL_TABLET | Freq: Every day | ORAL | Status: DC
  Administered 2016-11-26 – 2016-12-15 (×20): 75 mg via ORAL
  Filled 2016-11-25 (×20): qty 1

## 2016-11-25 NOTE — Progress Notes (Signed)
Physical Therapy Session Note  Patient Details  Name: Debra [name] MRN: [id number] Date of Birth: 02-Mar-1965  Today's Date: 11/25/2016 PT Individual Time: 0900-1000 PT Individual Time Calculation (min): 60 min   Short Term Goals: Week 1:  PT Short Term Goal 1 (Week 1): pt will consistently perform transfers with supervision PT Short Term Goal 2 (Week 1): pt will perform gait 50' in controlled environment with supervision  Skilled Therapeutic Interventions/Progress Updates:  Pt presented in bed agreeable to therapy. Continues to c/o pain in R quad buttock and Voltaren gel applied by nsg prior to session. Pt performed squat pivot to w/c min guard with RLE slipping. Transported pt to rehab gym total assist for energy conservation. Performed stand pivot to mat with RW. Performed R NMR, ROM activites including HS pulls on yellow physioball, LTR, SAQ, PTA assisted hip IR/ER in supine. Performed stand pivot w/c RW to w/c and transported pt to day room. Performed NuStep L1 x 8 min for R NMR with pt performing activity WLP. Pt returned to w/c and propelled w/c supervision x 260f for endurance. Pt returned to room and performed stand pivot to return to bed with minA sit to supine to LE management. Pt left in bed with bed alarm on, call bell within reach and needs met.      Therapy Documentation Precautions:  Precautions Precautions: Fall Restrictions Weight Bearing Restrictions: No General:   Vital Signs:  Pain: Pain Assessment Pain Score: 7  Pain Type: Chronic pain Pain Location: Leg Pain Orientation: Right;Upper Pain Descriptors / Indicators: Aching Pain Onset: On-going Pain Intervention(s): Repositioned  See Function Navigator for Current Functional Status.   Therapy/Group: Individual Therapy  Shandel Busic  Caleen Taaffe, PTA  11/25/2016, 12:57 PM

## 2016-11-25 NOTE — Progress Notes (Signed)
Occupational Therapy Session Note  Patient Details  Name: Debra [name] MRN: [id number] Date of Birth: 05/14/1965  Today's Date: 11/25/2016 OT Individual Time: 1330-1440 OT Individual Time Calculation (min): 70 min    Short Term Goals: Week 1:  OT Short Term Goal 1 (Week 1): STG = LTGs due to ELOS  Skilled Therapeutic Interventions/Progress Updates:    1;1. Pt engaged in baking activity in kitchen at sit to stand level with focus on energy conservation techniques, standing balance, standing endurance, functional use of RUE and engagment in leisure activity. Pt reads recipe for peanut butter cookies and gathers needed items at [facility] level with CGA using walker bag and sliding mixing bowl across the counter. Pt stands with min A at counter to mix ingredients and crack egg with focus on no UE support while standing working on bimanual task. Pt able to put pans into oven in seated position with supervision. Pt washes dishes in seated position for energy conservation and discusses various energy conservation strategies, kitchen set up and implementation of organization at home. Pt peopels w/c to/from all tx destinations with supervision and seated rest breaks for BUE strengthening. Exited session with pt seated in w/c with call light in reach and all needs met.   Therapy Documentation Precautions:  Precautions Precautions: Fall Restrictions Weight Bearing Restrictions: No  See Function Navigator for Current Functional Status.   Therapy/Group: Individual Therapy  Tonny Branch 11/25/2016, 2:40 PM

## 2016-11-25 NOTE — Progress Notes (Signed)
Debra [name]     PROGRESS NOTE    Subjective/Complaints: Sore after eased to floor when right knee gave out in therapy mid afternoon yesterday. Right thigh/right low back still symptomatic  Review of Systems  Constitutional: Negative for chills and fever.  Eyes: Negative for double vision.  Respiratory: Negative for cough.   Cardiovascular: Negative for chest pain.  Gastrointestinal: Negative for heartburn.  Genitourinary: Negative for dysuria and urgency.  Musculoskeletal: Positive for back pain, falls and myalgias.  Skin: Negative for rash.  Neurological: Positive for sensory change and focal weakness.  Psychiatric/Behavioral: Negative for depression and suicidal ideas.     Objective: Vital Signs: Blood pressure (!) 182/98, pulse 76, temperature 98 F (36.7 C), temperature source Oral, resp. rate 18, height 5\' 4"  (1.626 m), weight 76 kg (167 lb 8.8 oz), SpO2 100 %. No results found.  Recent Labs  11/22/16 1941  WBC 16.7*  HGB 13.0  HCT 39.3  PLT 303    Recent Labs  11/22/16 1941 11/23/16 0215 11/25/16 0401  NA 132*  --  135  K 4.7  --  4.6  CL 97*  --  104  GLUCOSE 284* 377* 118*  BUN 28*  --  37*  CREATININE 1.28*  --  1.44*  CALCIUM 9.3  --  9.0   CBG (last 3)   Recent Labs  11/24/16 1700 11/24/16 2118 11/25/16 0645  GLUCAP 336* 234* 87    Wt Readings from Last 3 Encounters:  11/22/16 76 kg (167 lb 8.8 oz)  11/18/16 76.2 kg (167 lb 15.9 oz)  11/07/16 68.4 kg (150 lb 12.8 oz)    Physical Exam:  Constitutional: Debra [name] is oriented to person, place, and time. Debra [name] appears well-developed and well-nourished.  HENT:  Head: Normocephalic and atraumatic.  Eyes: EOM are normal. Right eye exhibits no discharge. Left eye exhibits no discharge.  Neck: Normal range of motion. Neck supple. No thyromegaly present.  Cardiovascular: RRR without murmur. No JVD      Respiratory: CTA Bilaterally without wheezes or rales. Normal  effort   GI: soft NT  Musculoskeletal: tender along right lumbar paraspinals and right glutes, TFL.  Neurological: Debra [name] is alert and oriented to person, place, and time.  Motor: B/l UE 5/5 proximal to distal RLE: HF, KE 3/5, ADF/PF 3 to 3+/5 LLE: HF, KE 4-/5, ADF/PF 4/5 DTRs 2+ Sensation diminished to light touch in bilateral lower ext as well as distal arms--stable.   Skin: Skin is warm and dry.  Psychiatric: pleasant  Assessment/Plan: 1. Functional and neurological deficits secondary to multiple sclerosis which require 3+ hours per day of interdisciplinary therapy in a comprehensive inpatient rehab setting. Physiatrist is providing close team supervision and 24 hour management of active medical problems listed below. Physiatrist and rehab team continue to assess barriers to discharge/monitor patient progress toward functional and medical goals.  Function:  Bathing Bathing position   Position: Wheelchair/chair at sink  Bathing parts Body parts bathed by patient: Right arm, Left arm, Chest, Abdomen, Right upper leg Body parts bathed by helper: Buttocks, Right lower leg, Left lower leg, Back  Bathing assist Assist Level: Touching or steadying assistance(Pt > 75%) (Mod assist)      Upper Body Dressing/Undressing Upper body dressing   What is the patient wearing?: Pull over shirt/dress     Pull over shirt/dress - Perfomed by patient: Thread/unthread right sleeve, Thread/unthread left sleeve, Pull shirt over trunk, Put head through opening  Upper body assist Assist Level: Touching or steadying assistance(Pt > 75%)      Lower Body Dressing/Undressing Lower body dressing   What is the patient wearing?: Pants, Shoes, Non-skid slipper socks     Pants- Performed by patient: Thread/unthread left pants leg Pants- Performed by helper: Thread/unthread right pants leg, Pull pants up/down Non-skid slipper socks- Performed by patient: Don/doff left sock Non-skid slipper socks-  Performed by helper: Don/doff right sock     Shoes - Performed by patient: Don/doff left shoe Shoes - Performed by helper: Don/doff right shoe          Lower body assist Assist for lower body dressing:  (Max assist)      Toileting Toileting   Toileting steps completed by patient: Adjust clothing prior to toileting, Adjust clothing after toileting   Toileting Assistive Devices: Grab bar or rail  Toileting assist Assist level: Touching or steadying assistance (Pt.75%)   Transfers Chair/bed transfer   Chair/bed transfer method: Ambulatory Chair/bed transfer assist level: Moderate assist (Pt 50 - 74%/lift or lower)       Locomotion Ambulation     Max distance: 10 Assist level: Moderate assist (Pt 50 - 74%)   Wheelchair   Type: Manual Max wheelchair distance: 50 Assist Level: Supervision or verbal cues  Cognition Comprehension Comprehension assist level: Follows complex conversation/direction with no assist  Expression Expression assist level: Expresses complex ideas: With no assist  Social Interaction Social Interaction assist level: Interacts appropriately with others - No medications needed.  Problem Solving Problem solving assist level: Solves basic problems with no assist  Memory Memory assist level: More than reasonable amount of time   Medical Problem List and Plan: 1.  Decreased functional mobility with bilateral lower extremity weakness secondary to active demyelinating disease/multiple sclerosis.  -Intravenous Solu-Medrol initiated 11/21/2016  2.  DVT Prophylaxis/Anticoagulation: subcutaneous heparin. Monitor platelet counts any signs of bleeding 3. Pain Management/fibromyalgia: Neurontin 600 mg 3 times a day, Voltaren gel, oxycodone as needed for dysesthetic pain  -right low back, TFL,glute strain with fall---recommend stretching, limited ice/heat  -analgesics as above 4. Mood: provide emotional support 5. Neuropsych: This patient his capable of making  decisions on her own behalf. 6. Skin/Wound Care: Routine skin checks 7. Fluids/Electrolytes/Nutrition:    -sodium up to 135  -BUN continues to trend up  -encourage fluids---if further elevation will need IVF 8.Hypertension.---uncontrolled.   -Norvasc 10 mg daily, Toprol 50 mg daily, lisinopril 40 mg daily   -increase toprol to 75mg  daily 9.Diabetes mellitus of peripheral neuropathy.Latest hemoglobin A1c of 14.    - NovoLog 8 units 3 times a day..   -  lantus  25 units bid---increase am lantus to 35 units  -early morning cbg's better. Pm cbg's still elevated 10.Hyperlipidemia. Lipitor 11.Urinary retention. Removed foley  -voiding trial,  pvr's low so far  -cath prn 12.Medical noncompliance. Provide counseling   LOS (Days) 3 A FACE TO FACE EVALUATION WAS PERFORMED  Ranelle Oyster, MD 11/25/2016 9:23 AM

## 2016-11-25 NOTE — IPOC Note (Signed)
Overall Plan of Care [facility]) Patient Details Name: Debra [name] MRN: [id number] DOB: October 18, 1965  Admitting Diagnosis: <principal problem not specified>MS  Hospital Problems: Active Problems:   Thoracic root lesion   MS (multiple sclerosis) (HCC)   Neuropathic pain   Uncontrolled type 2 diabetes mellitus with peripheral neuropathy (HCC)   Urinary retention   Medically noncompliant     Functional Problem List: Nursing Edema  PT Endurance, Motor, Balance, Pain, Sensory  OT Balance, Endurance, Motor, Sensory, Safety, Pain  SLP    TR         Basic ADL's: OT Grooming, Bathing, Dressing, Toileting     Advanced  ADL's: OT Simple Meal Preparation     Transfers: PT Bed Mobility, Bed to Chair, Car, Furniture, Floor  OT Toilet, Research scientist (life sciences): PT Ambulation, Psychologist, prison and probation services, Stairs     Additional Impairments: OT Fuctional Use of Upper Extremity  SLP        TR      Anticipated Outcomes Item Anticipated Outcome  Self Feeding No goal  Swallowing      Basic self-care  Mod I  Toileting  Mod I   Bathroom Transfers Mod I - Supervision  Bowel/Bladder  will not have urinary tract infection  Transfers  mod I  Locomotion  mod I  Communication     Cognition     Pain  pain will remain tolerable  Safety/Judgment      Therapy Plan: PT Intensity: Minimum of 1-2 x/day ,45 to 90 minutes PT Frequency: 5 out of 7 days PT Duration Estimated Length of Stay: 10-14 days OT Intensity: Minimum of 1-2 x/day, 45 to 90 minutes OT Frequency: 5 out of 7 days OT Duration/Estimated Length of Stay: 7-10 days      Team Interventions: Nursing Interventions Pain Management, Bladder Management, Medication Management, Psychosocial Support  PT interventions Ambulation/gait training, Warden/ranger, Community reintegration, Discharge planning, DME/adaptive equipment instruction, Functional electrical stimulation, Functional mobility training, Neuromuscular  re-education, Pain management, Patient/family education, Skin care/wound management, Stair training, Therapeutic Activities, Splinting/orthotics, UE/LE Coordination activities, Therapeutic Exercise, UE/LE Strength taining/ROM, Wheelchair propulsion/positioning  OT Interventions Warden/ranger, Firefighter, Discharge planning, Disease mangement/prevention, Fish farm manager, Functional mobility training, Pain management, Patient/family education, Psychosocial support, Self Care/advanced ADL retraining, Neuromuscular re-education, Skin care/wound managment, Therapeutic Activities, Therapeutic Exercise, UE/LE Strength taining/ROM, UE/LE Coordination activities, Visual/perceptual remediation/compensation, Wheelchair propulsion/positioning  SLP Interventions    TR Interventions    SW/CM Interventions Discharge Planning, Psychosocial Support, Patient/Family Education   Barriers to Discharge MD  Medical stability  Nursing Decreased caregiver support    PT Decreased caregiver support, Medical stability    OT Decreased caregiver support    SLP      SW Lack of/limited family support     Team Discharge Planning: Destination: PT-Home ,OT- Home , SLP-  Projected Follow-up: PT-Home health PT, Outpatient PT, OT-  Home health OT, SLP-  Projected Equipment Needs: PT-To be determined, OT- Tub/shower seat, To be determined, SLP-  Equipment Details: PT- , OT-  Patient/family involved in discharge planning: PT- Patient,  OT-Patient, SLP-   MD ELOS: 10-12 days Medical Rehab Prognosis:  Excellent Assessment: The patient has been admitted for CIR therapies with the diagnosis of newly diagnosed MS. The team will be addressing functional mobility, strength, stamina, balance, safety, adaptive techniques and equipment, self-care, bowel and bladder mgt, patient and caregiver education, NMR, pain mgt, community reentry. Goals have been set at mod I for mobilty and self-care  tasks. Pt  quite motivated. She is a high fall risk patient.    Ranelle Oyster, MD, FAAPMR      See Team Conference Notes for weekly updates to the plan of care

## 2016-11-25 NOTE — Progress Notes (Signed)
Occupational Therapy Session Note  Patient Details  Name: CECILIA VANCLEVE MRN: [id number] Date of Birth: [birth year]  Today's Date: 11/25/2016 OT Individual Time: 1884-1660 OT Individual Time Calculation (min): 61 min    Short Term Goals: Week 1:  OT Short Term Goal 1 (Week 1): STG = LTGs due to ELOS  Skilled Therapeutic Interventions/Progress Updates:    Treatment session focused on ADL/self care training, transfer training, pt education, and functional mobility. Upon entering, pt in bed and agreeable to AM ADLs. Pt required encouragement for showering d/t feeling "uncomfortable". Therapist ensured safety and cleanliness of facility and pt agreed. Pt moved from supine to EOB with S and transferred into w/c with close guard assist. Shower transfer completed with v/c for hand/foot placement with use of grab bars with close guard assist. Pt completed UB d/b with S, and LB d/b with mod A at sit<>stand level. Therapist educated pt on AE for LB d/b. May benefit from reacher and sock aide. Pt completed grooming at sink side at standing level. Therapist provided safety awareness during txs and ADLs. Pt returned to bed with min A for LB guiding and instruction on bridging for self positioning in bed. Pt resting comfortably with needs met.   Therapy Documentation Precautions:  Precautions Precautions: Fall Restrictions Weight Bearing Restrictions: No   Pain: Pain Assessment Pain Score: 7  Pain Type: Chronic pain Pain Location: Leg Pain Orientation: Right;Upper Pain Descriptors / Indicators: Aching Pain Onset: On-going Pain Intervention(s): Repositioned  See Function Navigator for Current Functional Status.   Therapy/Group: Individual Therapy  Delon Sacramento 11/25/2016, 12:26 PM

## 2016-11-26 ENCOUNTER — Inpatient Hospital Stay (HOSPITAL_COMMUNITY): Payer: Self-pay | Admitting: Physical Therapy

## 2016-11-26 LAB — BASIC METABOLIC PANEL
Anion gap: 5 (ref 5–15)
BUN: 35 mg/dL — ABNORMAL HIGH (ref 6–20)
CO2: 29 mmol/L (ref 22–32)
CREATININE: 1.52 mg/dL — AB (ref 0.44–1.00)
Calcium: 8.4 mg/dL — ABNORMAL LOW (ref 8.9–10.3)
Chloride: 103 mmol/L (ref 101–111)
GFR calc Af Amer: 45 mL/min — ABNORMAL LOW (ref >60)
GFR calc non Af Amer: 39 mL/min — ABNORMAL LOW (ref >60)
GLUCOSE: 90 mg/dL (ref 65–99)
Potassium: 4.3 mmol/L (ref 3.5–5.1)
Sodium: 137 mmol/L (ref 135–145)

## 2016-11-26 LAB — GLUCOSE, CAPILLARY
GLUCOSE-CAPILLARY: 75 mg/dL (ref 65–99)
GLUCOSE-CAPILLARY: 341 mg/dL — AB (ref 65–99)
Glucose-Capillary: 181 mg/dL — ABNORMAL HIGH (ref 65–99)

## 2016-11-26 MED ORDER — INSULIN GLARGINE 100 UNIT/ML ~~LOC~~ SOLN
15.0000 [IU] | Freq: Every day | SUBCUTANEOUS | Status: DC
  Administered 2016-11-26 – 2016-11-30 (×5): 15 [IU] via SUBCUTANEOUS
  Filled 2016-11-26 (×6): qty 0.15

## 2016-11-26 NOTE — Progress Notes (Signed)
[facility]ICINE & REHABILITATION     PROGRESS NOTE    Subjective/Complaints:  low cbg (47) this morning. Had to be cathed for 750cc this morning also  Review of Systems  Constitutional: Negative for chills and fever.  Eyes: Negative for double vision.  Respiratory: Negative for cough.   Cardiovascular: Negative for chest pain.  Gastrointestinal: Negative for heartburn.  Genitourinary: Negative for dysuria.  Musculoskeletal: Positive for back pain, falls and myalgias.  Skin: Negative for rash.  Neurological: Positive for sensory change and focal weakness.  Psychiatric/Behavioral: Negative for depression and suicidal ideas.     Objective: Vital Signs: Blood pressure (!) 149/75, pulse 71, temperature 98.8 F (37.1 C), temperature source Oral, resp. rate 18, height  (1.626 m), weight 76 kg (167 lb 8.8 oz), SpO2 98 %. No results found. No results for input(s): WBC, HGB, HCT, PLT in the last 72 hours.  Recent Labs  11/25/16 0401  NA 135  K 4.6  CL 104  GLUCOSE 118*  BUN 37*  CREATININE 1.44*  CALCIUM 9.0   CBG (last 3)   Recent Labs  11/24/16 2118 11/25/16 0645 11/25/16 2059  GLUCAP 234* 87 137*    Wt Readings from Last 3 Encounters:  11/22/16 76 kg (167 lb 8.8 oz)  11/18/16 76.2 kg (167 lb 15.9 oz)  11/07/16 68.4 kg (150 lb 12.8 oz)    Physical Exam:  Constitutional: She is oriented to person, place, and time. She appears well-developed and well-nourished.  HENT:  Head: Normocephalic and atraumatic.  Eyes: EOM are normal. Right eye exhibits no discharge. Left eye exhibits no discharge.  Neck: Normal range of motion. Neck supple. No thyromegaly present.  Cardiovascular: RRR without murmur. No JVD       Respiratory: CTA Bilaterally without wheezes or rales. Normal effort   GI: BS +, non-tender, non-distended  Musculoskeletal: tender along right lumbar paraspinals and right glutes, TFL.  Neurological: She is alert and oriented to person,  place, and time.  Motor: B/l UE 5/5 proximal to distal RLE: HF, KE 3/5, ADF/PF 3 to 3+/5--stable exam LLE: HF, KE 4-/5, ADF/PF 4/5---stable exam DTRs 2+ Sensation diminished to light touch in bilateral lower ext as well as distal arms--stable.   Skin: Skin is warm and dry.  Psychiatric: pleasant  Assessment/Plan: 1. Functional and neurological deficits secondary to multiple sclerosis which require 3+ hours per day of interdisciplinary therapy in a comprehensive inpatient rehab setting. Physiatrist is providing close team supervision and 24 hour management of active medical problems listed below. Physiatrist and rehab team continue to assess barriers to discharge/monitor patient progress toward functional and medical goals.  Function:  Bathing Bathing position   Position: Shower  Bathing parts Body parts bathed by patient: Right arm, Left arm, Chest, Abdomen, Front perineal area, Right upper leg, Buttocks, Left upper leg Body parts bathed by helper: Buttocks, Right lower leg, Left lower leg, Back  Bathing assist Assist Level: Touching or steadying assistance(Pt > 75%)      Upper Body Dressing/Undressing Upper body dressing   What is the patient wearing?: Bra, Pull over shirt/dress Bra - Perfomed by patient: Thread/unthread right bra strap, Thread/unthread left bra strap, Hook/unhook bra (pull down sports bra)   Pull over shirt/dress - Perfomed by patient: Thread/unthread right sleeve, Thread/unthread left sleeve, Put head through opening, Pull shirt over trunk          Upper body assist Assist Level: Supervision or verbal cues      Lower Body  Dressing/Undressing Lower body dressing   What is the patient wearing?: Pants, Underwear, Non-skid slipper socks   Underwear - Performed by helper: Thread/unthread right underwear leg, Thread/unthread left underwear leg Pants- Performed by patient: Pull pants up/down, Fasten/unfasten pants Pants- Performed by helper: Thread/unthread  right pants leg, Thread/unthread left pants leg Non-skid slipper socks- Performed by patient: Don/doff left sock Non-skid slipper socks- Performed by helper: Don/doff right sock, Don/doff left sock     Shoes - Performed by patient: Don/doff left shoe Shoes - Performed by helper: Don/doff right shoe          Lower body assist Assist for lower body dressing: Touching or steadying assistance (Pt > 75%)      Toileting Toileting   Toileting steps completed by patient: Performs perineal hygiene   Toileting Assistive Devices: Grab bar or rail  Toileting assist Assist level: Touching or steadying assistance (Pt.75%)   Transfers Chair/bed transfer   Chair/bed transfer method: Ambulatory Chair/bed transfer assist level: Touching or steadying assistance (Pt > 75%)       Locomotion Ambulation     Max distance: 10 Assist level: Moderate assist (Pt 50 - 74%)   Wheelchair   Type: Manual Max wheelchair distance: 50 Assist Level: Supervision or verbal cues  Cognition Comprehension Comprehension assist level: Follows basic conversation/direction with no assist  Expression Expression assist level: Expresses basic needs/ideas: With no assist  Social Interaction Social Interaction assist level: Interacts appropriately 75 - 89% of the time - Needs redirection for appropriate language or to initiate interaction.  Problem Solving Problem solving assist level: Solves basic problems with no assist  Memory Memory assist level: Recognizes or recalls 90% of the time/requires cueing < 10% of the time   Medical Problem List and Plan: 1.  Decreased functional mobility with bilateral lower extremity weakness secondary to active demyelinating disease/multiple sclerosis.  -Intravenous Solu-Medrol initiated 11/21/2016  2.  DVT Prophylaxis/Anticoagulation: subcutaneous heparin. Monitor platelet counts any signs of bleeding 3. Pain Management/fibromyalgia: Neurontin 600 mg 3 times a day, Voltaren gel,  oxycodone as needed for dysesthetic pain  -right low back, TFL,glute strain with fall---recommend stretching, limited ice/heat  -analgesics as above 4. Mood: provide emotional support 5. Neuropsych: This patient his capable of making decisions on her own behalf. 6. Skin/Wound Care: Routine skin checks 7. Fluids/Electrolytes/Nutrition:    -sodium up to 135  -BUN continues to trend up--labs still not collected today 9/30  -encourage fluids---if further elevation will need IVF 8.Hypertension.--some improvement this morning  -Norvasc 10 mg daily, Toprol 50 mg daily, lisinopril 40 mg daily   -increased toprol to 75mg  daily on 9/29 9.Diabetes mellitus of peripheral neuropathy.Latest hemoglobin A1c of 14.    - NovoLog 8 units 3 times a day..   -  lantus bid--- continue am lantus  35 units  -early am hypoglycemia---decrease PM lantus from 25 to 15u 10.Hyperlipidemia. Lipitor 11.Urinary retention. Removed foley  -voiding trial,  pvr's low so far except for this morning.   -cath prn  -follow for pattern 12.Medical noncompliance. Provide counseling   LOS (Days) 4 A FACE TO FACE EVALUATION WAS PERFORMED  Ranelle Oyster, MD 11/26/2016 8:42 AM

## 2016-11-26 NOTE — Progress Notes (Signed)
[id number]  11/26/16 nursing Patient claims she feels weak. Per report CBG 42 recheck was 77; Night RN notified MD ; new orders noted.

## 2016-11-26 NOTE — Progress Notes (Signed)
Physical Therapy Note  Patient Details  Name: Debra [name] MRN: [id number] Date of Birth: 27-Aug-1965 Today's Date: 11/26/2016    Time: 1400-1435 35 minutes  1:1 Pt with no c/o pain. Pt states she feels "drowsy and weak"  Pt states her blood sugar has been dropping.  RN checked blood sugar during session and it is 139.  Pt agreeable to "try" PT.  Pt performs squat pivot transfer to w/c initially with max A, then with min A at end of session.  Pt performs transfer to nustep with mod A.  Nustep x 6 minutes level 3 with frequent rest breaks due to LE fatigue.  Pt states her Rt knee is "sore" after nustep and requests to return to bed.  Pt left in bed with needs at hand.   Stanely Sexson 11/26/2016, 2:48 PM

## 2016-11-26 NOTE — Progress Notes (Signed)
Hypoglycemic Event  CBG: 60  Treatment: resource orange  Symptoms: not feeling well; drowsy  Follow-up CBG: Time:1633 CBG Result:75  Possible Reasons for Event: medications  Comments/MD notified:followed hypoglycemic protocol    Gwenyth Allegra

## 2016-11-26 NOTE — Progress Notes (Signed)
Hypoglycemic Event  CBG: 42  Treatment: 15 GM carbohydrate snack  Symptoms: None  Follow-up CBG: Time:0656 CBG Result:77  Possible Reasons for Event: Unknown  Comments/MD notified:Yes Riley Kill, MD    Henreitta Cea  Steffanie Dunn

## 2016-11-27 ENCOUNTER — Inpatient Hospital Stay (HOSPITAL_COMMUNITY): Payer: Self-pay | Admitting: Occupational Therapy

## 2016-11-27 ENCOUNTER — Inpatient Hospital Stay (HOSPITAL_COMMUNITY): Payer: Self-pay | Admitting: Physical Therapy

## 2016-11-27 ENCOUNTER — Encounter (HOSPITAL_COMMUNITY): Payer: Self-pay | Admitting: Psychology

## 2016-11-27 DIAGNOSIS — G479 Sleep disorder, unspecified: Secondary | ICD-10-CM

## 2016-11-27 DIAGNOSIS — E162 Hypoglycemia, unspecified: Secondary | ICD-10-CM

## 2016-11-27 DIAGNOSIS — E1149 Type 2 diabetes mellitus with other diabetic neurological complication: Secondary | ICD-10-CM

## 2016-11-27 DIAGNOSIS — N179 Acute kidney failure, unspecified: Secondary | ICD-10-CM

## 2016-11-27 LAB — GLUCOSE, CAPILLARY
GLUCOSE-CAPILLARY: 72 mg/dL (ref 65–99)
GLUCOSE-CAPILLARY: 112 mg/dL — AB (ref 65–99)
GLUCOSE-CAPILLARY: 276 mg/dL — AB (ref 65–99)
GLUCOSE-CAPILLARY: 140 mg/dL — AB (ref 65–99)
Glucose-Capillary: 110 mg/dL — ABNORMAL HIGH (ref 65–99)
Glucose-Capillary: 164 mg/dL — ABNORMAL HIGH (ref 65–99)
Glucose-Capillary: 43 mg/dL — CL (ref 65–99)
Glucose-Capillary: 78 mg/dL (ref 65–99)
Glucose-Capillary: 139 mg/dL — ABNORMAL HIGH (ref 65–99)
Glucose-Capillary: 60 mg/dL — ABNORMAL LOW (ref 65–99)
Glucose-Capillary: 212 mg/dL — ABNORMAL HIGH (ref 65–99)
Glucose-Capillary: 214 mg/dL — ABNORMAL HIGH (ref 65–99)
Glucose-Capillary: 102 mg/dL — ABNORMAL HIGH (ref 65–99)
Glucose-Capillary: 101 mg/dL — ABNORMAL HIGH (ref 65–99)
Glucose-Capillary: 181 mg/dL — ABNORMAL HIGH (ref 65–99)

## 2016-11-27 LAB — BASIC METABOLIC PANEL
ANION GAP: 7 (ref 5–15)
BUN: 30 mg/dL — AB (ref 6–20)
CALCIUM: 8.4 mg/dL — AB (ref 8.9–10.3)
CO2: 28 mmol/L (ref 22–32)
Chloride: 102 mmol/L (ref 101–111)
Creatinine, Ser: 1.3 mg/dL — ABNORMAL HIGH (ref 0.44–1.00)
GFR calc Af Amer: 55 mL/min — ABNORMAL LOW (ref >60)
GFR calc non Af Amer: 47 mL/min — ABNORMAL LOW (ref >60)
GLUCOSE: 195 mg/dL — AB (ref 65–99)
Potassium: 4.7 mmol/L (ref 3.5–5.1)
Sodium: 137 mmol/L (ref 135–145)

## 2016-11-27 MED ORDER — TRAZODONE HCL 50 MG PO TABS
50.0000 mg | ORAL_TABLET | Freq: Every evening | ORAL | Status: DC | PRN
  Administered 2016-12-11: 50 mg via ORAL
  Filled 2016-11-27 (×2): qty 1

## 2016-11-27 NOTE — Progress Notes (Signed)
Occupational Therapy Session Note  Patient Details  Name: Debra [name] MRN: [id number] Date of Birth: 09-05-1965  Today's Date: 11/27/2016 OT Individual Time: 1346-1400 OT Individual Time Calculation (min): 14 min  and Today's Date: 11/27/2016 OT Missed Time: 26 Minutes Missed Time Reason: Patient unwilling/refused to participate without medical reason   Short Term Goals: Week 1:  OT Short Term Goal 1 (Week 1): STG = LTGs due to ELOS  Skilled Therapeutic Interventions/Progress Updates:    Treatment focus on discussion of pt goals and discharge planning. Pt received in bed with lunch tray attempting to eat lunch with c/o nausea. Therapist discussed importance of eating lunch and meal planning due to nausea. Engaged in discussion with therapist of pt goals and concerns about discharge. Pt left in bed with lunch tray and all needs in reach.  Therapy Documentation Precautions:  Precautions Precautions: Fall Restrictions Weight Bearing Restrictions: No General: General OT Amount of Missed Time: 26 Minutes Vital Signs:  Pain:   No c/o pain  See Function Navigator for Current Functional Status.   Therapy/Group: Individual Therapy  [name] 11/27/2016, 2:07 PM

## 2016-11-27 NOTE — Progress Notes (Signed)
Occupational Therapy Session Note  Patient Details  Name: Debra [name] MRN: [id number] Date of Birth: [birth year]  Today's Date: 11/27/2016 OT Individual Time: 8182-9937 OT Individual Time Calculation (min): 50 min    Short Term Goals: Week 1:  OT Short Term Goal 1 (Week 1): STG = LTGs due to ELOS  Skilled Therapeutic Interventions/Progress Updates:    Treatment focus on basic self-care training to facilitate independence in preparation for d/c home. Pt received on toilet and reporting dizziness, nausea, and weakness upon arrival. Pt willing to participate in bathing at sink at this time. Engaged in bathing sit > stand level with therapist providing min A for sit > stand and dynamic standing balance while pt performs peri/buttocks hygiene to promote independence. Therapist completed B lower leg hygiene and donning socks this session due to pt fatigue. Pt requesting to wear hospital gown at this time due to sickness. Pt with one episode of vomiting post standing. Notified RN. Pt left in w/c at sink with all needs in reach.  Therapy Documentation Precautions:  Precautions Precautions: Fall Restrictions Weight Bearing Restrictions: No General: General PT Missed Treatment Reason: Patient ill (Comment)  See Function Navigator for Current Functional Status.   Therapy/Group: Individual Therapy  [name] 11/27/2016, 12:13 PM

## 2016-11-27 NOTE — Progress Notes (Addendum)
Physical Therapy Note  Patient Details  Name: Debra [name] MRN: [id number] Date of Birth: 1965-05-07 Today's Date: 11/27/2016    Time: 830-918 48 minutes  1:1 No c/o pain.  Pt c/o feeling "a little weak" but willing to participate in treatment.  Pt performed sit to stand and standing balance with 1 UE support with min A, total A to don pants.  Gait with RW 25' x 2 with min A, no episodes of knee buckling during short distance gait.  Sit to stand and standing tolerance without AD with min A, pt states she feels "dizzy and nauseous" and pt is heavily perspiring.  RN made aware, blood sugar 214.  Pt agreeable to attempt nustep for continued LE strengthening. Pt performs x 6 minutes at level 3.  Pt missed final 12 minutes of session due to nausea and dizziness.  Time 2: 1430-1440 10 minutes  1:1 Pt continues with c/o nausea.  Pt attempting to eat lunch as PT arrives.  Pt refuses any out of bed or in bed activity at this time. PT educated pt on bed level exercises she could perform if she feels better later today, pt verbalizes understanding. Pt missed 35 minutes of skilled PT session.   Hiawatha Merriott 11/27/2016, 9:19 AM

## 2016-11-27 NOTE — Progress Notes (Addendum)
[facility] PHYSICAL MEDICINE & REHABILITATION     PROGRESS NOTE    Subjective/Complaints: Pt seen laying in bed this AM.  She states she slept fairly overnight because she could not get comfortable and requests sleep aid.   Review of Systems  Constitutional: Negative for chills and fever.  Eyes: Negative for double vision.  Respiratory: Negative for cough.   Cardiovascular: Negative for chest pain.  Gastrointestinal: Negative for heartburn.  Genitourinary: Negative for dysuria.  Musculoskeletal: Positive for back pain and myalgias.  Skin: Negative for rash.  Neurological: Positive for sensory change and focal weakness.  Psychiatric/Behavioral: Negative for depression and suicidal ideas.     Objective: Vital Signs: Blood pressure (!) 173/79, pulse 73, temperature 97.7 F (36.5 C), temperature source Oral, resp. rate 18, height 5\' 4"  (1.626 m), weight 76 kg (167 lb 8.8 oz), SpO2 100 %. No results found. No results for input(s): WBC, HGB, HCT, PLT in the last 72 hours.  Recent Labs  11/25/16 0401 11/26/16 1500  NA 135 137  K 4.6 4.3  CL 104 103  GLUCOSE 118* 90  BUN 37* 35*  CREATININE 1.44* 1.52*  CALCIUM 9.0 8.4*   CBG (last 3)   Recent Labs  11/26/16 1633 11/26/16 2121 11/27/16 0611  GLUCAP 75 341* 212*    Wt Readings from Last 3 Encounters:  11/22/16 76 kg (167 lb 8.8 oz)  11/18/16 76.2 kg (167 lb 15.9 oz)  11/07/16 68.4 kg (150 lb 12.8 oz)    Physical Exam:  Constitutional: She appears well-developed and well-nourished.  HENT: Normocephalic and atraumatic.  Eyes: EOM are normal. No discharge.  Cardiovascular: RRR. No JVD       Respiratory: CTA Bilaterally. Normal effort   GI: BS +, non-distended  Musculoskeletal: No tenderness, no edema in extremities.  Neurological: She is alert and oriented.  Motor: B/l UE 5/5 proximal to distal RLE: HF, KE 3/5, ADF/PF 3/5 LLE: HF, KE 4-/5, ADF/PF 4/5 Sensation diminished to light touch in bilateral lower ext  as  Skin: Skin is warm and dry.  Psychiatric: pleasant  Assessment/Plan: 1. Functional and neurological deficits secondary to multiple sclerosis which require 3+ hours per day of interdisciplinary therapy in a comprehensive inpatient rehab setting. Physiatrist is providing close team supervision and 24 hour management of active medical problems listed below. Physiatrist and rehab team continue to assess barriers to discharge/monitor patient progress toward functional and medical goals.  Function:  Bathing Bathing position   Position: Shower  Bathing parts Body parts bathed by patient: Right arm, Left arm, Chest, Abdomen, Front perineal area, Right upper leg, Buttocks, Left upper leg Body parts bathed by helper: Buttocks, Right lower leg, Left lower leg, Back  Bathing assist Assist Level: Touching or steadying assistance(Pt > 75%)      Upper Body Dressing/Undressing Upper body dressing   What is the patient wearing?: Bra, Pull over shirt/dress Bra - Perfomed by patient: Thread/unthread right bra strap, Thread/unthread left bra strap, Hook/unhook bra (pull down sports bra)   Pull over shirt/dress - Perfomed by patient: Thread/unthread right sleeve, Thread/unthread left sleeve, Put head through opening, Pull shirt over trunk          Upper body assist Assist Level: Supervision or verbal cues      Lower Body Dressing/Undressing Lower body dressing   What is the patient wearing?: Pants, Underwear, Non-skid slipper socks   Underwear - Performed by helper: Thread/unthread right underwear leg, Thread/unthread left underwear leg Pants- Performed by patient: Pull pants  up/down, Fasten/unfasten pants Pants- Performed by helper: Thread/unthread right pants leg, Thread/unthread left pants leg Non-skid slipper socks- Performed by patient: Don/doff left sock Non-skid slipper socks- Performed by helper: Don/doff right sock, Don/doff left sock     Shoes - Performed by patient: Don/doff  left shoe Shoes - Performed by helper: Don/doff right shoe          Lower body assist Assist for lower body dressing: Touching or steadying assistance (Pt > 75%)      Toileting Toileting   Toileting steps completed by patient: Performs perineal hygiene Toileting steps completed by helper: Adjust clothing prior to toileting, Adjust clothing after toileting Toileting Assistive Devices: Grab bar or rail  Toileting assist Assist level: Touching or steadying assistance (Pt.75%)   Transfers Chair/bed transfer   Chair/bed transfer method: Ambulatory Chair/bed transfer assist level: Touching or steadying assistance (Pt > 75%)       Locomotion Ambulation     Max distance: 10 Assist level: Moderate assist (Pt 50 - 74%)   Wheelchair   Type: Manual Max wheelchair distance: 50 Assist Level: Supervision or verbal cues  Cognition Comprehension Comprehension assist level: Follows basic conversation/direction with no assist  Expression Expression assist level: Expresses basic needs/ideas: With no assist  Social Interaction Social Interaction assist level: Interacts appropriately 75 - 89% of the time - Needs redirection for appropriate language or to initiate interaction.  Problem Solving Problem solving assist level: Solves basic problems with no assist  Memory Memory assist level: Recognizes or recalls 90% of the time/requires cueing < 10% of the time   Medical Problem List and Plan: 1.  Decreased functional mobility with bilateral lower extremity weakness secondary to active demyelinating disease/multiple sclerosis.  Cont CIR 2.  DVT Prophylaxis/Anticoagulation: subcutaneous heparin. Monitor platelet counts any signs of bleeding 3. Pain Management/fibromyalgia: Neurontin 600 mg 3 times a day, Voltaren gel, oxycodone as needed for dysesthetic pain  -analgesics as above 4. Mood: provide emotional support 5. Neuropsych: This patient is capable of making decisions on her own behalf. 6.  Skin/Wound Care: Routine skin checks 7. Fluids/Electrolytes/Nutrition:    -encourage fluids---if further elevation will need IVF 8.Hypertension.  -Norvasc 10 mg daily, lisinopril 40 mg daily  -increased toprol to 75mg  daily on 9/29  Remains elevated, will consider further increase tomorrow 9.Diabetes mellitus of peripheral neuropathy.Latest hemoglobin A1c of 14.    -NovoLog 8 units 3 times a day..   -lantus 35 AM  -Decreased PM lantus from 25 to 15u  Labile, with hypoglycemia in 60s yesterday 10.Hyperlipidemia. Lipitor 11.Urinary retention. Removed foley  -voiding trial,  pvr's low so far except for this morning.   -cath prn  -follow for pattern 12.Medical noncompliance. Provide counseling 13. AKI  Cr 1.30 on 10/1  Cont to monitor 14. Sleep disturbance  PRN Trazodone started 10/1  LOS (Days) 5 A FACE TO FACE EVALUATION WAS PERFORMED  Eldar Robitaille Karis Juba, MD 11/27/2016 9:13 AM

## 2016-11-28 ENCOUNTER — Inpatient Hospital Stay (HOSPITAL_COMMUNITY): Payer: Self-pay | Admitting: Physical Therapy

## 2016-11-28 ENCOUNTER — Inpatient Hospital Stay (HOSPITAL_COMMUNITY): Payer: Self-pay | Admitting: Occupational Therapy

## 2016-11-28 ENCOUNTER — Encounter (HOSPITAL_COMMUNITY): Payer: Self-pay | Admitting: Psychology

## 2016-11-28 DIAGNOSIS — I160 Hypertensive urgency: Secondary | ICD-10-CM

## 2016-11-28 LAB — GLUCOSE, CAPILLARY
GLUCOSE-CAPILLARY: 93 mg/dL (ref 65–99)
GLUCOSE-CAPILLARY: 99 mg/dL (ref 65–99)

## 2016-11-28 MED ORDER — LISINOPRIL 20 MG PO TABS
20.0000 mg | ORAL_TABLET | Freq: Two times a day (BID) | ORAL | Status: DC
  Administered 2016-11-28 – 2016-12-15 (×34): 20 mg via ORAL
  Filled 2016-11-28 (×34): qty 1

## 2016-11-28 MED ORDER — TAMSULOSIN HCL 0.4 MG PO CAPS
0.4000 mg | ORAL_CAPSULE | Freq: Every day | ORAL | Status: DC
  Administered 2016-11-28 – 2016-12-01 (×4): 0.4 mg via ORAL
  Filled 2016-11-28 (×4): qty 1

## 2016-11-28 MED ORDER — PROCHLORPERAZINE MALEATE 5 MG PO TABS
5.0000 mg | ORAL_TABLET | Freq: Four times a day (QID) | ORAL | Status: DC | PRN
  Administered 2016-11-28 – 2016-12-15 (×6): 5 mg via ORAL
  Filled 2016-11-28 (×6): qty 1

## 2016-11-28 MED ORDER — HYDRALAZINE HCL 10 MG PO TABS
10.0000 mg | ORAL_TABLET | Freq: Three times a day (TID) | ORAL | Status: DC | PRN
  Administered 2016-11-28 (×2): 10 mg via ORAL
  Filled 2016-11-28 (×2): qty 1

## 2016-11-28 NOTE — Consult Note (Signed)
Neuropsychological Consultation   Patient:   Debra [name]   DOB:   [birth year]  MR Number:  [id number]  Location:  [facility] [id number] [place] [address] Dept: [phone] WEX: [phone]           Date of Service:   11/28/2016  Start Time:   4 PM End Time:   4:55 PM  Provider/Observer:  Ilean Skill, Psy.D.       Clinical Neuropsychologist       Billing Code/Service: [phone] 4 Units  Chief Complaint:    Debra [name] is a 51 year old female who has a history of cardiovascular event that occurred in 1990. She has changes in gait as well as not always compliant with medical follow-up. The patient is a diagnosis of type 1 diabetes as well as neuropathy. The patient presented on 11/15/2016 with progressive weakness in her lower extremities as well as back pain lasting the prior 2 months. She had been having increasing difficulties walking and associated gastrointestinal issues.  MRI of her cervical spine conducted on 11/21/2016 showed patchy cord signal abnormalities extending from the craniocervical junction through the upper thoracic spine region. These findings were consistent with a demyelinating disease. MRI of the brain showed multifocal cerebral white matter changes in patterns most consistent with demyelinating disease (multiple sclerosis). The patient has been referred for comprehensive inpatient rehabilitation program. The patient has a history of depression anxiety in the past and not following through with medication recommendations and medical recommendations. The patient is described as being very anxious at times and worrying and wondering what is been going on with her medically and what her future prognosis is.  Reason for Service:  Debra [name] was referred for neuropsychological consultation to work on coping skills and strategies around dealing with her recent diagnosis of  possible multiple sclerosis as well as coping with the residual neurological symptoms that have developed. Below is the full history of present illness for the current admission.  HPI: Debra [name] a 51 y.o.right handed femalewith history of CVA 1990 as well as gait dysfunction and patient was noncompliant with medical follow-up, type 1 diabetes mellitus with neuropathy, DKA, fibromyalgia. Per chart review and patient, patient lives with her boyfriend. Boyfriend works during the day and no other local family can assist. One level home with 1 step to entry. She was independent until approximately 3 weeks ago. Presented 11/15/2016 with progressive weakness bilateral lower extremities as well as back pain over the past 2 months and difficulty with ambulation as well as associated diarrhea. Noted sodium 127, creatinine 2.72.UDS positive opiates.troponin 0.47 felt to be secondary to demand ischemia.CT abdomen and chest x-ray unremarkable. Glucose was 998 she was started on insulin drip. MRI with thoracic lesion. Per reports, MRI lumbar and thoracic showed no canal stenosis or neural foraminal narrowing however there was some paraspinal muscle edema.Thoracic MRI showed a 3 x 13 mm intramedullary nonenhancing lesionT10 level.serum CK was normal. Neurology did not recommend any further workup in regards to reported suspect lesion as there was no mass effect and cauda equina syndrome was ruled out. ESR and CK were normal.MRI 09/25/2018of the brain completed showing multifocal cerebral white matter changes in a distribution most consistent with demyelinating disease, multiple sclerosis. No other acute intracranial abnormality.MRI cervical spine 11/21/2016 showed patchy cord signal abnormality extending from the craniocervical junction through the upper thoracic spine again consistent with demyelinating disease. Evidence for  active demyelination at the level of C1-2 and C6.neurology was contacted in regards to latest  MRI findings and placed on IV Solu-Medrol for anticipation of 5 days.Initial plan for lumbar puncture but later discontinued after her MRI findings. Bouts of urinary retention with bladder scan 999on 11/22/2016. Subcutaneous heparin for DVT prophylaxis. Physical occupational therapy evaluations completed. Recommendations were later made for physical medicine rehabilitation consult.Patient was admitted for a comprehensive rehabilitation program  Current Status:  The patient admits to experiencing increasing anxiety and while her depression is worse and she does have a history of depression and anxiety. The patient reports that she has been worried and confused about what is actually happening and we did spend a significant amount of time going over the working diagnosis and how that may impact her not only acutely but also long-term issues.  Behavioral Observation: Debra [name]  presents as a 51 y.o.-year-old Right African American Female who appeared her stated age. her dress was Appropriate and she was Well Groomed and her manners were Appropriate to the situation.  her participation was indicative of Appropriate and Attentive behaviors.  There were physical disabilities noted.  she displayed an appropriate level of cooperation and motivation.     Interactions:    Active Appropriate and Attentive  Attention:   within normal limits and attention span and concentration were age appropriate  Memory:   within normal limits; recent and remote memory intact  Visuo-spatial:  within normal limits  Speech (Volume):  low  Speech:   normal;   Thought Process:  Coherent and Relevant  Though Content:  WNL; not suicidal  Orientation:   person, place, time/date and situation  Judgment:   Good  Planning:   Fair  Affect:    Anxious  Mood:    Anxious  Insight:   Fair  Intelligence:   normal  Medical History:   Past Medical History:  Diagnosis Date  . Adenomatous colonic polyps   . Anemia     2005  . Anxiety    1990  . Arthritis   . Asthma    2000  . Cataract   . Depression 1990  . Difficult intubation    narrow airway  . Gastroesophageal Reflux Disease (GERD)   . Heart murmur    Birth  . Hyperlipidemia    2005  . Hypertension    1998  . Internal hemorrhoids 04/24/06   on colonoscopy  . Migraine   . Neuropathy of the hands & feet   . Restless Leg Syndrome   . Right Ankle Fracture 10/06/2013  . Sleep paralysis   . Stable angina pectoris    2007: cath showing normal cors.   . Stroke 1990  . Type I Diabetes Mellitus 1988   Family Med/Psych History:  Family History  Problem Relation Age of Onset  . Hypertension Mother   . Kidney disease Mother   . Hypertension Father   . Breast cancer Maternal Grandmother   . Prostate cancer Maternal Grandfather   . Ovarian cancer Paternal Grandmother   . Prostate cancer Paternal Grandfather   . Colon cancer Maternal Uncle        Family history of malignant neoplasm of gastrointestinal tract    Risk of Suicide/Violence: virtually non-existent the patient denies any suicidal or homicidal ideation.  Impression/DX:  Debra [name] is a 51 year old female who has a history of cardiovascular event that occurred in 1990. She has changes in gait as well as not always compliant with medical  follow-up. The patient is a diagnosis of type 1 diabetes as well as neuropathy. The patient presented on 11/15/2016 with progressive weakness in her lower extremities as well as back pain lasting the prior 2 months. She had been having increasing difficulties walking and associated gastrointestinal issues.  MRI of her cervical spine conducted on 11/21/2016 showed patchy cord signal abnormalities extending from the craniocervical junction through the upper thoracic spine region. These findings were consistent with a demyelinating disease. MRI of the brain showed multifocal cerebral white matter changes in patterns most consistent with demyelinating disease  (multiple sclerosis). The patient has been referred for comprehensive inpatient rehabilitation program. The patient has a history of depression anxiety in the past and not following through with medication recommendations and medical recommendations. The patient is described as being very anxious at times and worrying and wondering what is been going on with her medically and what her future prognosis is.   Disposition/Plan:  We'll continue to work with this patient going for to help build and develop further coping skills and strategies as well as emphasized the need for her active participation in her ongoing medical care.         Electronically Signed   _______________________ Ilean Skill, Psy.D.

## 2016-11-28 NOTE — Progress Notes (Addendum)
Inpatient Diabetes Program Recommendations  AACE/ADA: New Consensus Statement on Inpatient Glycemic Control (2015)  Target Ranges:  Prepandial:   less than 140 mg/dL      Peak postprandial:   less than 180 mg/dL (1-2 hours)      Critically ill patients:  140 - 180 mg/dL   Lab Results  Component Value Date   GLUCAP 93 11/28/2016   HGBA1C >14.0 10/31/2016    Review of Glycemic Control   Results for Debra [name], SEIM (MRN [id number]) as of 11/28/2016 11:53  Ref. Range 11/27/2016 11:24 11/27/2016 16:36 11/27/2016 20:45 11/27/2016 21:08 11/28/2016 06:11  Glucose-Capillary Latest Ref Range: 65 - 99 mg/dL 287 (H) 681 (H) 157 (H) 181 (H) 93    Diabetes history:DM1 (makes NO insulin; requires basal, correction, and meal coverage insulin)  Outpatient Diabetes medications: Lantus 15 units qhs, Lantus 35 units qhs, Novolog 6 units TID with meals, Novolog 0-20 units tid, Novolog 0-5 units qhs  Inpatient Diabetes Program Recommendations: Consider decreasing Lantus to 30 units qhs, decrease Novolog to 4 units tid with meals, decrease Novolog correction to moderate correction scale.   Susette Racer, RN, BA, MHA, CDE Diabetes Coordinator Inpatient Diabetes Program  [phone] (Team Pager) [phone] [facility] Office) 11/28/2016 12:04 PM

## 2016-11-28 NOTE — Progress Notes (Signed)
Physical Therapy Session Note  Patient Details  Name: Debra [name] MRN: [id number] Date of Birth: 06-05-1965  Today's Date: 11/28/2016 PT Individual Time: 0930-1028 PT Individual Time Calculation (min): 58 min   Skilled Therapeutic Interventions/Progress Updates:    pt c/o no pain, rather nausea and dizziness. Blood glucose was 93 at 600 am and BP  Was 163/75 at 930 am. Pt performed sit<>stand with rw min A. 10 mini squats min A.  Pt performed ambulation min A with RW 2x 6f' was very fatigued after each stretch. Pt stood I with supervising and performed catching and throwing of the beach ball for 1 min until fatigued.Progressed to balance and weight shifting LE by kicking the ball. Pt was unable to kick the ball with mod A with out RW, 3x10 kicks R,L,Alternating with RW I with supervision. Pt performed dynamic sitting balance on dynadisc. Was unable to perform upper trunk rotations due to dizziness and nausea. Pt performed alternating cone toe touches while sitting on dynadisc for abdominal strengthening,synamic balance and coordination. Pt c/o nausea and wished to return to room. Pt was left supine in bed with head of bed elevated. Call bell at side and all needs met. Therapy Documentation  Therapy/Group: Individual Therapy  [name] 11/28/2016, 11:10 AM

## 2016-11-28 NOTE — Progress Notes (Signed)
Occupational Therapy Session Note  Patient Details  Name: GRACELEIGH KUBECKA MRN: [id number] Date of Birth: Mar 08, 1965  Today's Date: 11/28/2016 OT Individual Time: 1416-1501 OT Individual Time Calculation (min): 45 min    Short Term Goals: Week 1:  OT Short Term Goal 1 (Week 1): STG = LTGs due to ELOS  Skilled Therapeutic Interventions/Progress Updates:    Treatment focus on functional mobility and dynamic standing balance within the home to promote independence in basic self-care and IADLs. Pt c/o nausea but willing to participate in therapy. Engaged in laundry task (obtaining and putting away clothes in dresser) from w/c with therapist providing education of disease management and using w/c within the home to perform daily tasks/self-care. Therapist had pt complete task in standing with use of RW to facilitate dynamic standing balance required for LB dressing/bathing and hygiene. Pt completed task with min guard for reaching at a higher level and min A for lower level. Ambulated ~10' w/c <> toilet with RW in ADL apartment with min guard and use of grab bars for sit > stand. Engaged in kitchen task reaching for items over head to challenge dynamic standing balance with and without UE support and therapist providing min guard and education of safety within the home when performing tasks. Pt returned to bed with RN in room at end of session.  Therapy Documentation Precautions:  Precautions Precautions: Fall Restrictions Weight Bearing Restrictions: No Pain: No c/o pain  See Function Navigator for Current Functional Status.   Therapy/Group: Individual Therapy  [name] 11/28/2016, 3:16 PM

## 2016-11-28 NOTE — Progress Notes (Signed)
Occupational Therapy Session Note  Patient Details  Name: CHRISELDA TRUSLER MRN: [id number] Date of Birth: 1966-02-04  Today's Date: 11/28/2016 OT Individual Time: 1130 (make up time)-1200 OT Individual Time Calculation (min): 30 min    Short Term Goals: Week 1:  OT Short Term Goal 1 (Week 1): STG = LTGs due to ELOS  Skilled Therapeutic Interventions/Progress Updates:    Upon entering the room, pt supine in bed and reports feeling heavy nausea and declines OOB tasks. OT provided pt with paper handouts regarding energy conservation. OT provided education on this topic for self care, home management, and meal management at home. Pt verbalized understanding and asking appropriate questions. Pt remained in bed at end of session with call bell and all needed items within reach upon exiting the room.   Therapy Documentation Precautions:  Precautions Precautions: Fall Restrictions Weight Bearing Restrictions: No General:   Vital Signs: Therapy Vitals BP: (!) 199/86 Patient Position (if appropriate): Sitting Pain: Pain Assessment Pain Assessment: 0-10 Pain Score: 7  Pain Type: Acute pain Pain Location: Abdomen Pain Intervention(s): Medication (See eMAR)  See Function Navigator for Current Functional Status.   Therapy/Group: Individual Therapy  Alen Bleacher 11/28/2016, 12:03 PM

## 2016-11-28 NOTE — Progress Notes (Signed)
Patient BP was 185/90 manual, and 194/87 auto. On-call notified but no new order given. We continue to monitor.

## 2016-11-28 NOTE — Progress Notes (Signed)
Physical Therapy Session Note  Patient Details  Name: Debra [name] MRN: [id number] Date of Birth: June 16, 1965  Today's Date: 11/28/2016 PT Individual Time: 1300-1330 PT Individual Time Calculation (min): 30 min   Skilled Therapeutic Interventions/Progress Updates:    pt c/o no pain and was agreeable to therapy. Pt was able to transfer min A from bed to wc. Pt performed standing balance exercises min A with RW for strengthening of the LE.   standing marches in place 3x10.  3x20 of toe taps in either a forward or lateral direction while listening to commands such as which foot to use and what color to tap. 2x10 of standing marches with out utilizing one hand for support at a time. on first round not utilizing the L hand was notably more difficult than the R.  Pt was able to perform all exercises with minimal rests in between, rests were needed due to nausea feeling. Pt was able to role her self in her WC 100+ ft before fatigue. Pt was left in [facility] with call bell and all needs met.   Therapy/Group: Individual Therapy  Dakota Vanwart 11/28/2016, 3:04 PM

## 2016-11-28 NOTE — Progress Notes (Signed)
[facility]IL[facility]     PROGRESS NOTE    Subjective/Complaints: Pt seen laying in bed this AM.  She states she slept better overnight, but did not use sleep aid.  Per nursing, HTN overnight.   Review of Systems  Respiratory: Negative for shortness of breath.   Cardiovascular: Negative for chest pain.  Gastrointestinal: Negative for diarrhea, nausea and vomiting.  Neurological: Positive for sensory change and focal weakness.  Psychiatric/Behavioral: Negative for depression and suicidal ideas.     Objective: Vital Signs: Blood pressure (!) 193/92, pulse 72, temperature 98.5 F (36.9 C), temperature source Oral, resp. rate 18, height 5\' 4"  (1.626 m), weight 76 kg (167 lb 8.8 oz), SpO2 100 %. No results found. No results for input(s): WBC, HGB, HCT, PLT in the last 72 hours.  Recent Labs  11/26/16 1500 11/27/16 0821  NA 137 137  K 4.3 4.7  CL 103 102  GLUCOSE 90 195*  BUN 35* 30*  CREATININE 1.52* 1.30*  CALCIUM 8.4* 8.4*   CBG (last 3)   Recent Labs  11/27/16 2045 11/27/16 2108 11/28/16 0611  GLUCAP 140* 181* 93    Wt Readings from Last 3 Encounters:  11/22/16 76 kg (167 lb 8.8 oz)  11/18/16 76.2 kg (167 lb 15.9 oz)  11/07/16 68.4 kg (150 lb 12.8 oz)    Physical Exam:  Constitutional: She appears well-developed and well-nourished.  HENT: Normocephalic and atraumatic.  Eyes: EOM are normal. No discharge.  Cardiovascular: RRR. No JVD       Respiratory: CTA Bilaterally. Normal effort   GI: BS +, Non-distended  Musculoskeletal: No tenderness, no edema in extremities.  Neurological: She is alert and oriented.  Motor: B/l UE 5/5 proximal to distal RLE: HF, KE 3/5, ADF/PF 3/5 (unchanged) LLE: HF, KE 4-/5, ADF/PF 4/5 (unchanged) Sensation diminished to light touch in bilateral lower extremities Skin: Skin is warm and dry.  Psychiatric: pleasant  Assessment/Plan: 1. Functional and neurological deficits secondary to multiple sclerosis  which require 3+ hours per day of interdisciplinary therapy in a comprehensive inpatient rehab setting. Physiatrist is providing close team supervision and 24 hour management of active medical problems listed below. Physiatrist and rehab team continue to assess barriers to discharge/monitor patient progress toward functional and medical goals.  Function:  Bathing Bathing position   Position: Wheelchair/chair at sink  Bathing parts Body parts bathed by patient: Right arm, Left arm, Chest, Abdomen, Front perineal area, Buttocks, Right upper leg, Left upper leg Body parts bathed by helper: Right lower leg, Left lower leg  Bathing assist Assist Level: Touching or steadying assistance(Pt > 75%)      Upper Body Dressing/Undressing Upper body dressing   What is the patient wearing?: Hospital gown Bra - Perfomed by patient: Thread/unthread right bra strap, Thread/unthread left bra strap, Hook/unhook bra (pull down sports bra)   Pull over shirt/dress - Perfomed by patient: Thread/unthread right sleeve, Thread/unthread left sleeve, Put head through opening, Pull shirt over trunk          Upper body assist Assist Level: Set up   Set up : To obtain clothing/put away  Lower Body Dressing/Undressing Lower body dressing   What is the patient wearing?: Non-skid slipper socks   Underwear - Performed by helper: Thread/unthread right underwear leg, Thread/unthread left underwear leg Pants- Performed by patient: Pull pants up/down, Fasten/unfasten pants Pants- Performed by helper: Thread/unthread right pants leg, Thread/unthread left pants leg Non-skid slipper socks- Performed by patient: Don/doff left sock Non-skid slipper  socks- Performed by helper: Don/doff right sock, Don/doff left sock     Shoes - Performed by patient: Don/doff left shoe Shoes - Performed by helper: Don/doff right shoe          Lower body assist Assist for lower body dressing: Touching or steadying assistance (Pt >  75%)      Toileting Toileting   Toileting steps completed by patient: Performs perineal hygiene Toileting steps completed by helper: Adjust clothing prior to toileting, Performs perineal hygiene, Adjust clothing after toileting Toileting Assistive Devices: Grab bar or rail  Toileting assist Assist level: Touching or steadying assistance (Pt.75%)   Transfers Chair/bed transfer   Chair/bed transfer method: Ambulatory Chair/bed transfer assist level: Touching or steadying assistance (Pt > 75%)       Locomotion Ambulation     Max distance: 25 Assist level: Touching or steadying assistance (Pt > 75%)   Wheelchair   Type: Manual Max wheelchair distance: 50 Assist Level: Supervision or verbal cues  Cognition Comprehension Comprehension assist level: Follows basic conversation/direction with extra time/assistive device  Expression Expression assist level: Expresses basic 90% of the time/requires cueing < 10% of the time.  Social Interaction Social Interaction assist level: Interacts appropriately 90% of the time - Needs monitoring or encouragement for participation or interaction.  Problem Solving Problem solving assist level: Solves basic 90% of the time/requires cueing < 10% of the time  Memory Memory assist level: Recognizes or recalls 90% of the time/requires cueing < 10% of the time   Medical Problem List and Plan: 1.  Decreased functional mobility with bilateral lower extremity weakness secondary to active demyelinating disease/multiple sclerosis.  Cont CIR 2.  DVT Prophylaxis/Anticoagulation: subcutaneous heparin. Monitor platelet counts any signs of bleeding 3. Pain Management/fibromyalgia: Neurontin 600 mg 3 times a day, Voltaren gel, oxycodone as needed for dysesthetic pain  -analgesics as above 4. Mood: provide emotional support 5. Neuropsych: This patient is capable of making decisions on her own behalf. 6. Skin/Wound Care: Routine skin checks 7.  Fluids/Electrolytes/Nutrition:    -encourage fluids---if further elevation will need IVF 8.Hypertension.  Norvasc 10 mg daily  Lisinopril changed to 20mg  BID on 10/2  Increased toprol to 75mg  daily on 9/29  Hydralazine 10 TID PRN started 10/2  Hypertensive urgency overnight, however extremely labile. 9.Diabetes mellitus of peripheral neuropathy.Latest hemoglobin A1c of 14.    -NovoLog 8 units 3 times a day..   -lantus 35 AM  -Decreased PM lantus from 25 to 15u  Improving control 10.Hyperlipidemia. Lipitor 11.Urinary retention. Removed foley  -voiding trial  -cath prn  Flomax started on 10/2 due to elevated PVRs 12.Medical noncompliance. Provide counseling 13. AKI  Cr 1.30 on 10/1  Cont to monitor 14. Sleep disturbance  PRN Trazodone started 10/1  LOS (Days) 6 A FACE TO FACE EVALUATION WAS PERFORMED  Cinda Hara Karis Juba, MD 11/28/2016 8:03 AM

## 2016-11-28 NOTE — Progress Notes (Signed)
Occupational Therapy Session Note  Patient Details  Name: Debra [name] MRN: [id number] Date of Birth: 11-May-1965  Today's Date: 11/28/2016 OT Individual Time: 9449-6759 OT Individual Time Calculation (min): 56 min   Short Term Goals: Week 1:  OT Short Term Goal 1 (Week 1): STG = LTGs due to ELOS  Skilled Therapeutic Interventions/Progress Updates:    Treatment focus on basic ADLs to promote independence and progression towards LTGs. Pt reporting nausea and BP reading 193/92 at beginning of therapy session with RN notified; however pt asymptomatic. Pt willing to participate in bathing at sink. Bed > chair squat pivot transfer with min guard. Engaged in bathing at sink sit > stand level with therapist noting frequent rest breaks during task due to fatigue and nausea. Completed UB/LB bathing with supervision provided by therapist. Pt able to don LLE TED hose with instructional cues from therapist; however, due to fatigue, therapist completed RLE TED hose. Therapist providing pt education of dressing techniques, AE, and energy conservation for bathing and dressing to facilitate independence and safety in preparation for d/c. BP reading 199/86 at end of session. Pt in w/c with breakfast tray and all needs in reach.  Therapy Documentation Precautions:  Precautions Precautions: Fall Restrictions Weight Bearing Restrictions: No Vital Signs: Therapy Vitals BP: (!) 199/86 Patient Position (if appropriate): Sitting Pain: Pain Assessment Pain Assessment: 0-10 Pain Score: 7  Pain Type: Acute pain Pain Location: Abdomen Pain Intervention(s): Medication (See eMAR)  See Function Navigator for Current Functional Status.   Therapy/Group: Individual Therapy  [name] 11/28/2016, 12:01 PM

## 2016-11-29 ENCOUNTER — Inpatient Hospital Stay (HOSPITAL_COMMUNITY): Payer: Self-pay

## 2016-11-29 ENCOUNTER — Inpatient Hospital Stay (HOSPITAL_COMMUNITY): Payer: Self-pay | Admitting: Occupational Therapy

## 2016-11-29 DIAGNOSIS — I169 Hypertensive crisis, unspecified: Secondary | ICD-10-CM

## 2016-11-29 LAB — GLUCOSE, CAPILLARY
GLUCOSE-CAPILLARY: 188 mg/dL — AB (ref 65–99)
GLUCOSE-CAPILLARY: 171 mg/dL — AB (ref 65–99)
GLUCOSE-CAPILLARY: 42 mg/dL — AB (ref 65–99)
GLUCOSE-CAPILLARY: 89 mg/dL (ref 65–99)
Glucose-Capillary: 175 mg/dL — ABNORMAL HIGH (ref 65–99)
Glucose-Capillary: 232 mg/dL — ABNORMAL HIGH (ref 65–99)
Glucose-Capillary: 376 mg/dL — ABNORMAL HIGH (ref 65–99)
Glucose-Capillary: 291 mg/dL — ABNORMAL HIGH (ref 65–99)

## 2016-11-29 MED ORDER — BOOST / RESOURCE BREEZE PO LIQD
1.0000 | Freq: Every day | ORAL | Status: DC
  Administered 2016-11-30 – 2016-12-09 (×6): 1 via ORAL

## 2016-11-29 MED ORDER — HYDRALAZINE HCL 25 MG PO TABS: 25.0000 mg | ORAL_TABLET | Freq: Three times a day (TID) | ORAL | Status: DC | PRN

## 2016-11-29 MED ORDER — BETHANECHOL CHLORIDE 10 MG PO TABS
10.0000 mg | ORAL_TABLET | Freq: Three times a day (TID) | ORAL | Status: DC
  Administered 2016-11-29 – 2016-11-30 (×4): 10 mg via ORAL
  Filled 2016-11-29 (×3): qty 1

## 2016-11-29 NOTE — Progress Notes (Signed)
Occupational Therapy Session Note  Patient Details  Name: JAQUISE FEIN MRN: [id number] Date of Birth: 05-20-1965  Today's Date: 11/29/2016 OT Individual Time: 0930-1000 OT Individual Time Calculation (min): 30 min    Short Term Goals: Week 1:  OT Short Term Goal 1 (Week 1): STG = LTGs due to ELOS  Skilled Therapeutic Interventions/Progress Updates:    1:1 Therapeutic activity with focus on sit to stands, standing balance without UE support (normal BOS and tandem), and LE and core strengthening with modified OTAGA exercises in standing. Left in room to rest after session.   Therapy Documentation Precautions:  Precautions Precautions: Fall Restrictions Weight Bearing Restrictions: No  Pain:  no c/o pain in session   See Function Navigator for Current Functional Status.   Therapy/Group: Individual Therapy  Roney Mans [facility] 11/29/2016, 10:19 AM

## 2016-11-29 NOTE — Progress Notes (Signed)
Physical Therapy Note  Patient Details  Name: Debra [name] MRN: [id number] Date of Birth: June 03, 1965 Today's Date: 11/29/2016  1100-1200, 60 min individual tyx Pain: 7/10: back, bil LEs numbness/tingling; premedicated  Pt noted to have had BM; she had no sensation of this.  Pt rolled and bridged up to assist with hygiene change and donning brief.  Neuromuscular re-education via multimodal cues in supine: 2 x 10 each bil lower trunk rotation, bil bridging, bil ankle pumps with extended knees, 1 x 10 bil hip internal rotation (R active assistive), R/L heel slides, R/L shoulder protraction; in L side lying: 1 x 10 bil hip flex; sitting, 1 x 10 : R/L hip flex, R/L long arc quad knee ext.  Pt had difficulty counting aloud while keeping track of number of repetitions of each ex during session, despite many cues.    Upon sitting EOB, pt c/o dizziness and nausea, which decreased after a couple of minutes, but did not resolve. Stand pivot transfer bed> recliner. Poor eccentric control stand> sit.  Pt declined attempting gait due to nausea,.  Pt left resting in recliner with all needs within reach.  See function navigator for current status.  Gionni Freese 11/29/2016, 11:36 AM

## 2016-11-29 NOTE — Progress Notes (Signed)
CBG is 42, given peanutbutter, crackers, soda. Will recheck.

## 2016-11-29 NOTE — Progress Notes (Signed)
[facility] MEDIC[facility] RE[facility]ABILITATION     PROGRESS NOTE    Subjective/Complaints: Pt seen sitting up at EOB eating breakfast this AM.  She slept well overnight.  She notes controlled urination.   Review of Systems  Respiratory: Negative for shortness of breath.   Cardiovascular: Negative for chest pain.  Gastrointestinal: Negative for diarrhea, nausea and vomiting.  Neurological: Positive for sensory change and focal weakness.  Psychiatric/Behavioral: Negative for depression and suicidal ideas.     Objective: Vital Signs: Blood pressure (!) 161/75, pulse 75, temperature 98.3 F (36.8 C), temperature source Oral, resp. rate 16, height 5\' 4"  (1.626 m), weight 76.2 kg (168 lb 1.4 oz), SpO2 100 %. No results found. No results for input(s): WBC, HGB, HCT, PLT in the last 72 hours.  Recent Labs  11/26/16 1500 11/27/16 0821  NA 137 137  K 4.3 4.7  CL 103 102  GLUCOSE 90 195*  BUN 35* 30*  CREATININE 1.52* 1.30*  CALCIUM 8.4* 8.4*   CBG (last 3)   Recent Labs  11/28/16 0611 11/28/16 1235 11/29/16 0621  GLUCAP 93 99 171*    Wt Readings from Last 3 Encounters:  11/29/16 76.2 kg (168 lb 1.4 oz)  11/18/16 76.2 kg (167 lb 15.9 oz)  11/07/16 68.4 kg (150 lb 12.8 oz)    Physical Exam:  Constitutional: She appears well-developed and well-nourished.  HENT: Normocephalic and atraumatic.  Eyes: EOM are normal. No discharge.  Cardiovascular: RRR. No JVD       Respiratory: CTA Bilaterally. Normal effort   GI: BS +, Non-distended  Musculoskeletal: No tenderness, no edema in extremities.  Neurological: She is alert and oriented.  Motor: B/l UE 5/5 proximal to distal RLE: HF 3-/5, KE 3/5, ADF/PF 3/5  LLE: HF, KE 3+/5, ADF/PF 4/5  Sensation diminished to light touch in bilateral lower extremities Skin: Skin is warm and dry.  Psychiatric: pleasant  Assessment/Plan: 1. Functional and neurological deficits secondary to multiple sclerosis which require 3+ hours per  day of interdisciplinary therapy in a comprehensive inpatient rehab setting. Physiatrist is providing close team supervision and 24 hour management of active medical problems listed below. Physiatrist and rehab team continue to assess barriers to discharge/monitor patient progress toward functional and medical goals.  Function:  Bathing Bathing position   Position: Wheelchair/chair at sink  Bathing parts Body parts bathed by patient: Right arm, Left arm, Chest, Abdomen, Front perineal area, Buttocks, Right upper leg, Left upper leg, Right lower leg, Left lower leg Body parts bathed by helper: Right lower leg, Left lower leg  Bathing assist Assist Level: Supervision or verbal cues      Upper Body Dressing/Undressing Upper body dressing   What is the patient wearing?: Hospital gown Bra - Perfomed by patient: Thread/unthread right bra strap, Thread/unthread left bra strap, Hook/unhook bra (pull down sports bra)   Pull over shirt/dress - Perfomed by patient: Thread/unthread right sleeve, Thread/unthread left sleeve, Put head through opening, Pull shirt over trunk          Upper body assist Assist Level: Set up   Set up : To obtain clothing/put away  Lower Body Dressing/Undressing Lower body dressing   What is the patient wearing?: Non-skid slipper socks, Ted Hose   Underwear - Performed by helper: Thread/unthread right underwear leg, Thread/unthread left underwear leg Pants- Performed by patient: Pull pants up/down, Fasten/unfasten pants Pants- Performed by helper: Thread/unthread right pants leg, Thread/unthread left pants leg Non-skid slipper socks- Performed by patient: Don/doff right sock,  Don/doff left sock Non-skid slipper socks- Performed by helper: Don/doff right sock, Don/doff left sock     Shoes - Performed by patient: Don/doff left shoe Shoes - Performed by helper: Don/doff right shoe     TED Hose - Performed by patient: Don/doff left TED hose TED Hose - Performed by  helper: Don/doff right TED hose  Lower body assist Assist for lower body dressing: Touching or steadying assistance (Pt > 75%)      Toileting Toileting   Toileting steps completed by patient: Performs perineal hygiene Toileting steps completed by helper: Adjust clothing prior to toileting, Performs perineal hygiene, Adjust clothing after toileting Toileting Assistive Devices: Grab bar or rail  Toileting assist Assist level: Touching or steadying assistance (Pt.75%)   Transfers Chair/bed transfer   Chair/bed transfer method: Ambulatory Chair/bed transfer assist level: Touching or steadying assistance (Pt > 75%)       Locomotion Ambulation     Max distance: 25 Assist level: Touching or steadying assistance (Pt > 75%)   Wheelchair   Type: Manual Max wheelchair distance: 50 Assist Level: Supervision or verbal cues  Cognition Comprehension Comprehension assist level: Follows basic conversation/direction with extra time/assistive device  Expression Expression assist level: Expresses basic 90% of the time/requires cueing < 10% of the time.  Social Interaction Social Interaction assist level: Interacts appropriately 90% of the time - Needs monitoring or encouragement for participation or interaction.  Problem Solving Problem solving assist level: Solves basic 90% of the time/requires cueing < 10% of the time  Memory Memory assist level: Recognizes or recalls 90% of the time/requires cueing < 10% of the time   Medical Problem List and Plan: 1.  Decreased functional mobility with bilateral lower extremity weakness secondary to active demyelinating disease/multiple sclerosis.  Cont CIR 2.  DVT Prophylaxis/Anticoagulation: subcutaneous heparin. Monitor platelet counts any signs of bleeding 3. Pain Management/fibromyalgia: Neurontin 600 mg 3 times a day, Voltaren gel, oxycodone as needed for dysesthetic pain  -analgesics as above 4. Mood: provide emotional support 5. Neuropsych: This  patient is capable of making decisions on her own behalf. 6. Skin/Wound Care: Routine skin checks 7. Fluids/Electrolytes/Nutrition:    -encourage fluids---if further elevation will need IVF 8.Hypertension.  Norvasc 10 mg daily  Lisinopril changed to  BID on 10/2  Increased toprol to  daily on 9/29  Hydralazine 10 TID PRN started 10/2, increased to 25 on 10/3  Hypertensive crisis yesterday afternoon 9.Diabetes mellitus of peripheral neuropathy.Latest hemoglobin A1c of 14.    -NovoLog 8 units 3 times a day..   -lantus 35 AM  -Decreased PM lantus from 25 to 15u  Relatively controlled, will consider further increase tomorrow 10.Hyperlipidemia. Lipitor 11.Urinary retention. Removed foley  -voiding trial  -cath prn  Flomax started on 10/2 due to elevated PVRs with Volumes >1000ccs  Bethanechol 10 TID started 10/3 12.Medical noncompliance. Provide counseling 13. AKI  Cr 1.30 on 10/1  Cont to monitor 14. Sleep disturbance  PRN Trazodone started 10/1  LOS (Days) 7 A FACE TO FACE EVALUATION WAS PERFORMED  Ahuva Poynor Karis Juba, MD 11/29/2016 8:23 AM

## 2016-11-29 NOTE — Progress Notes (Signed)
Hypoglycemic Event  CBG:42  Treatment: 15 GM carbohydrate snack  Symptoms: Pale  Follow-up CBG: Time:1245 CBG Result:**89  Possible Reasons for Event: Inadequate meal intake  Comments/MD notified:1230    Claudean Severance

## 2016-11-29 NOTE — Progress Notes (Signed)
Nutrition Follow-up  DOCUMENTATION CODES:   Not applicable  INTERVENTION:  Discontinue Glucerna shake.  Provide Boost Breeze po once daily, each supplement provides 250 kcal and 9 grams of protein.  Encourage adequate PO intake.   NUTRITION DIAGNOSIS:   Inadequate oral intake related to nausea, poor appetite as evidenced by per patient/family report; improved  GOAL:   Patient will meet greater than or equal to 90% of their needs; met  MONITOR:   PO intake, Supplement acceptance, Weight trends, Labs, I & O's  REASON FOR ASSESSMENT:   Malnutrition Screening Tool    ASSESSMENT:   Pt with PMH of type I DM with neuropathy, HLD, HTN, GERD, DKA presents with thoracic root lesion  Meal completion has been 100%. Pt currently has Glucerna Shake ordered and has been refusing them. Pt prefers Boost Breeze more. RD to modify orders. Labs and medications reviewed.   Diet Order:  Diet Carb Modified Fluid consistency: Thin; Room service appropriate? Yes  Skin:   (non pressure wound on buttocks)  Last BM:  10/1  Height:   Ht Readings from Last 1 Encounters:  11/22/16 '5\' 4"'$  (1.626 m)    Weight:   Wt Readings from Last 1 Encounters:  11/29/16 168 lb 1.4 oz (76.2 kg)    Ideal Body Weight:  54.5 kg  BMI:  Body mass index is 28.85 kg/m.  Estimated Nutritional Needs:   Kcal:  1800-2000  Protein:  90-115 grams  Fluid:  >/= 1.8 L/d  EDUCATION NEEDS:   Education needs no appropriate at this time  Corrin Parker, MS, RD, LDN Pager # [phone] After hours/ weekend pager # [phone]

## 2016-11-29 NOTE — Progress Notes (Signed)
Occupational Therapy Session Note  Patient Details  Name: Debra [name] MRN: [id number] Date of Birth: 1966/02/17  Today's Date: 11/29/2016 OT Individual Time: 0729-0829 OT Individual Time Calculation (min): 60 min    Short Term Goals: Week 1:  OT Short Term Goal 1 (Week 1): STG = LTGs due to ELOS  Skilled Therapeutic Interventions/Progress Updates:    Treatment focus on basic self-care and functional transfers to promote independence and decrease burden of care. Engaged in bathing in room shower this session to facilitate normal routine prior to admission. Bed > w/c squat pivot transfer supervision. Pt requesting to use w/c vs RW to get to bathroom due to leg pain. W/c <> tub bench squat pivot transfer with min guard from therapist and use of grab bars. UB/LB bathing with supervision from therapist for sit > stand and dynamic standing balance with UE support. Pt attempted to don TED hose but unable to complete due to fatigue. Therapist providing education of LB dressing techniques and encouraging energy conservation techniques to promote independence at home. Therapist simulating LB dressing with thera-band to practice donning pants in standing and seated lateral leans with pt attempting both techniques with instructional cues provided by therapist. Therapist noting improvement in standing balance and LB dressing to don pants over hips. Pt left in w/c with all needs in reach.  Therapy Documentation Precautions:  Precautions Precautions: Fall Restrictions Weight Bearing Restrictions: No Pain:  C/o pain rating 6/10 BLE and back but willing to participate in therapy session  See Function Navigator for Current Functional Status.   Therapy/Group: Individual Therapy  [name] 11/29/2016, 11:24 AM

## 2016-11-29 NOTE — Progress Notes (Signed)
Occupational Therapy Note  Patient Details  Name: Debra [name] MRN: [id number] Date of Birth: 12-Jan-1966  Today's Date: 11/29/2016 OT Missed Time: 45 Minutes Missed Time Reason: Patient ill (comment)  Patient missed time due to low blood glucose of 42 with patient reporting not feeling well. Therapist encouraged pt to eat lunch with pt attempting. Will follow up with pt as needed. Pt in recliner with lunch tray and call bell in reach.  [name] 11/29/2016, 1:14 PM

## 2016-11-30 ENCOUNTER — Inpatient Hospital Stay (HOSPITAL_COMMUNITY): Payer: Self-pay | Admitting: Physical Therapy

## 2016-11-30 ENCOUNTER — Inpatient Hospital Stay (HOSPITAL_COMMUNITY): Payer: Self-pay | Admitting: Occupational Therapy

## 2016-11-30 DIAGNOSIS — R7309 Other abnormal glucose: Secondary | ICD-10-CM

## 2016-11-30 LAB — GLUCOSE, CAPILLARY
GLUCOSE-CAPILLARY: 89 mg/dL (ref 65–99)
GLUCOSE-CAPILLARY: 204 mg/dL — AB (ref 65–99)
GLUCOSE-CAPILLARY: 76 mg/dL (ref 65–99)
Glucose-Capillary: 292 mg/dL — ABNORMAL HIGH (ref 65–99)

## 2016-11-30 MED ORDER — BETHANECHOL CHLORIDE 25 MG PO TABS
25.0000 mg | ORAL_TABLET | Freq: Three times a day (TID) | ORAL | Status: DC
  Administered 2016-11-30 – 2016-12-04 (×12): 25 mg via ORAL
  Filled 2016-11-30 (×12): qty 1

## 2016-11-30 NOTE — Progress Notes (Signed)
Physical Therapy Session Note  Patient Details  Name: Debra [name] MRN: [id number] Date of Birth: 15-Feb-1966  Today's Date: 11/30/2016 PT Individual Time: 1445-1520 PT Individual Time Calculation (min): 35 min  PT Missed Time calculation (min): 25 min (pain)   Short Term Goals: Week 2:  PT Short Term Goal 1 (Week 2): STG=LTG  Skilled Therapeutic Interventions/Progress Updates:   Pt in w/c upon arrival and agreeable to therapy, no c/o pain at beginning of session. Transferred to EOB w/ supervision and to w/c w/ Min A. Worked on self-propelling w/c to dayroom for endurance w/ functional mobility. Self-propelled w/c w/ supervision using bilateral UEs for 75'. Worked on standing balance at high/low table w/ supervision while performing UE tasks, verbal cues to not rely on UE support for balance. Performed tasks on and off foam surface. Pt c/o pain at this time as detailed below and RN made aware. Attempted to perform NuStep and kinetron to continue working on endurance and LE strengthening, however pt unable to tolerate activities 2/2 pain. Returned to room early and ended session in supine, repositioned in bed w/ pillows under knees to minimize strain on back in supine. Call bell within reach and all needs met.   Therapy Documentation Precautions:  Precautions Precautions: Fall Restrictions Weight Bearing Restrictions: No General: PT Amount of Missed Time (min): 25 Minutes PT Missed Treatment Reason: Pain Vital Signs: Therapy Vitals Temp: 98.3 F (36.8 C) Temp Source: Oral Pulse Rate: 89 Resp: 20 BP: (!) 157/77 (RN notified) Patient Position (if appropriate): Lying Oxygen Therapy SpO2: 100 % O2 Device: Not Delivered Pain: Pain Assessment Pain Assessment: 0-10 Pain Score: 7  Pain Type: Acute pain Pain Location: Back Pain Orientation: Lower Pain Descriptors / Indicators: Aching Pain Frequency: Occasional Pain Onset: On-going Patients Stated Pain Goal: 0 Pain  Intervention(s): Medication (See eMAR)  See Function Navigator for Current Functional Status.   Therapy/Group: Individual Therapy  Lillia Lengel K Arnette 11/30/2016, 3:30 PM

## 2016-11-30 NOTE — Progress Notes (Signed)
Occupational Therapy Note  Patient Details  Name: Debra [name] MRN: [id number] Date of Birth: 12-28-1965  Today's Date: 11/30/2016 OT Individual Time: 0900-0930 OT Individual Time Calculation (min): 30 min   No c/o pain  Pt seen this session to focus on RLE strength/ stability in standing balance exercises.   -Pt initially donned a pair of disposable scrub top/ pants with steadying A in standing with RW in front of pt.  -Sit to squat with L hand on bed rail, R hand on thigh with a slight lean to R 10 x 3.  -Standing exercises with back of sturdy arm chair in front of pt: R foot sliding in and out in abd/add R foot stepping in and out and then forward and back Stand to partial squat ( pt with back pain in lower squat) Wt shifting to R leg Partial squat with L heel raised to increase force on R leg Heel raises   Pt participated well only only needed a few brief rest breaks. Pt opted to lay back down to rest prior to PT session. Pt with all needs met.    [place] 11/30/2016, 11:53 AM

## 2016-11-30 NOTE — Progress Notes (Signed)
[facility] PHYSICAL MEDICINE & REHABILITATION     PROGRESS NOTE    Subjective/Complaints: Pt seen sitting at EOB this AM.  She slept well overnight.  She notes improvement in retention.   Review of Systems  Respiratory: Negative for shortness of breath.   Cardiovascular: Negative for chest pain.  Gastrointestinal: Negative for diarrhea, nausea and vomiting.  Neurological: Positive for sensory change and focal weakness.  Psychiatric/Behavioral: Negative for depression and suicidal ideas.  All other systems reviewed and are negative.   Objective: Vital Signs: Blood pressure (!) 150/72, pulse 68, temperature 98.9 F (37.2 C), temperature source Oral, resp. rate 18, height 5\' 4"  (1.626 m), weight 76.2 kg (168 lb 1.4 oz), SpO2 100 %. No results found. No results for input(s): WBC, HGB, HCT, PLT in the last 72 hours. No results for input(s): NA, K, CL, GLUCOSE, BUN, CREATININE, CALCIUM in the last 72 hours.  Invalid input(s): CO CBG (last 3)   Recent Labs  11/29/16 1632 11/29/16 2138 11/30/16 0632  GLUCAP 376* 291* 89    Wt Readings from Last 3 Encounters:  11/29/16 76.2 kg (168 lb 1.4 oz)  11/18/16 76.2 kg (167 lb 15.9 oz)  11/07/16 68.4 kg (150 lb 12.8 oz)    Physical Exam:  Constitutional: She appears well-developed and well-nourished.  HENT: Normocephalic and atraumatic.  Eyes: EOM are normal. No discharge.  Cardiovascular: RRR. No JVD       Respiratory: CTA Bilaterally. Normal effort   GI: BS +, Non-distended  Musculoskeletal: No tenderness, no edema in extremities.  Neurological: She is alert and oriented.  Motor: B/l UE 5/5 proximal to distal RLE: HF 3/5, KE 3/5, ADF/PF 3/5  LLE: HF, KE 3+/5, ADF/PF 4/5  Sensation diminished to light touch in bilateral lower extremities Skin: Skin is warm and dry.  Psychiatric: pleasant  Assessment/Plan: 1. Functional and neurological deficits secondary to multiple sclerosis which require 3+ hours per day of  interdisciplinary therapy in a comprehensive inpatient rehab setting. Physiatrist is providing close team supervision and 24 hour management of active medical problems listed below. Physiatrist and rehab team continue to assess barriers to discharge/monitor patient progress toward functional and medical goals.  Function:  Bathing Bathing position   Position: Shower  Bathing parts Body parts bathed by patient: Right arm, Left arm, Chest, Abdomen, Front perineal area, Buttocks, Right upper leg, Left upper leg, Right lower leg, Left lower leg Body parts bathed by helper: Back  Bathing assist Assist Level: Supervision or verbal cues      Upper Body Dressing/Undressing Upper body dressing   What is the patient wearing?: Pull over shirt/dress, Hospital gown Bra - Perfomed by patient: Thread/unthread right bra strap, Thread/unthread left bra strap, Hook/unhook bra (pull down sports bra)   Pull over shirt/dress - Perfomed by patient: Thread/unthread right sleeve, Thread/unthread left sleeve, Put head through opening, Pull shirt over trunk          Upper body assist Assist Level: Set up   Set up : To obtain clothing/put away  Lower Body Dressing/Undressing Lower body dressing   What is the patient wearing?: [employer], Shoes   Underwear - Performed by helper: Thread/unthread right underwear leg, Thread/unthread left underwear leg Pants- Performed by patient: Pull pants up/down, Fasten/unfasten pants Pants- Performed by helper: Thread/unthread right pants leg, Thread/unthread left pants leg Non-skid slipper socks- Performed by patient: Don/doff right sock, Don/doff left sock Non-skid slipper socks- Performed by helper: Don/doff right sock, Don/doff left sock     Shoes -  Performed by patient: Don/doff right shoe, Don/doff left shoe Shoes - Performed by helper: Don/doff right shoe     TED Hose - Performed by patient: Don/doff left TED hose TED Hose - Performed by helper: Don/doff right  TED hose, Don/doff left TED hose  Lower body assist Assist for lower body dressing: Touching or steadying assistance (Pt > 75%)      Toileting Toileting   Toileting steps completed by patient: Performs perineal hygiene Toileting steps completed by helper: Adjust clothing prior to toileting, Performs perineal hygiene, Adjust clothing after toileting Toileting Assistive Devices: Grab bar or rail  Toileting assist Assist level: Touching or steadying assistance (Pt.75%)   Transfers Chair/bed transfer   Chair/bed transfer method: Stand pivot Chair/bed transfer assist level: Touching or steadying assistance (Pt > 75%) Chair/bed transfer assistive device: Patent attorney     Max distance: 25 Assist level: Touching or steadying assistance (Pt > 75%)   Wheelchair   Type: Manual Max wheelchair distance: 50 Assist Level: Supervision or verbal cues  Cognition Comprehension Comprehension assist level: Follows basic conversation/direction with extra time/assistive device  Expression Expression assist level: Expresses basic 90% of the time/requires cueing < 10% of the time.  Social Interaction Social Interaction assist level: Interacts appropriately 90% of the time - Needs monitoring or encouragement for participation or interaction.  Problem Solving Problem solving assist level: Solves basic 90% of the time/requires cueing < 10% of the time  Memory Memory assist level: Recognizes or recalls 90% of the time/requires cueing < 10% of the time   Medical Problem List and Plan: 1.  Decreased functional mobility with bilateral lower extremity weakness secondary to active demyelinating disease/multiple sclerosis.  Cont CIR 2.  DVT Prophylaxis/Anticoagulation: subcutaneous heparin. Monitor platelet counts any signs of bleeding 3. Pain Management/fibromyalgia: Neurontin 600 mg 3 times a day, Voltaren gel, oxycodone as needed for dysesthetic pain  -analgesics as above 4. Mood:  provide emotional support 5. Neuropsych: This patient is capable of making decisions on her own behalf. 6. Skin/Wound Care: Routine skin checks 7. Fluids/Electrolytes/Nutrition:    -encourage fluids---if further elevation will need IVF 8.Hypertension.  Norvasc 10 mg daily  Lisinopril changed to 20mg  BID on 10/2  Increased toprol to 75mg  daily on 9/29  Hydralazine 10 TID PRN started 10/2, increased to 25 on 10/3  Improving control 10/4 9.Diabetes mellitus of peripheral neuropathy.Latest hemoglobin A1c of 14.    -NovoLog 8 units 3 times a day..   -lantus 35 AM  -Decreased PM lantus from 25 to 15u  Extremely labile with hypoglycemia in 40s  Cont to monitor for trend 10.Hyperlipidemia. Lipitor 11.Urinary retention. Removed foley  -voiding trial  -cath prn  Flomax started on 10/2   PVRs with Volumes ~1400ccs  Bethanechol 10 TID started 10/3, increased to 25 on 10/4 12.Medical noncompliance. Provide counseling 13. AKI  Cr 1.30 on 10/1  Cont to monitor 14. Sleep disturbance  PRN Trazodone started 10/1 15. Cognition  SLP eval ordered  LOS (Days) 8 A FACE TO FACE EVALUATION WAS PERFORMED  Ankit Karis Juba, MD 11/30/2016 9:17 AM

## 2016-11-30 NOTE — Progress Notes (Signed)
Physical Therapy Weekly Progress Note  Patient Details  Name: Debra [name] MRN: [id number] Date of Birth: 1965/05/11  Beginning of progress report period: November 23, 2016 End of progress report period: November 30, 2016   Patient has met 0 of 2 short term goals.  Pt progress has been limited due to decreased medical stability limiting participation in therapy.  Pt has been performing gait and transfers with supervision/min A with RW.  Patient continues to demonstrate the following deficits muscle weakness, impaired timing and sequencing, unbalanced muscle activation and decreased coordination and decreased standing balance, decreased postural control and decreased balance strategies and therefore will continue to benefit from skilled PT intervention to increase functional independence with mobility.  Patient progressing toward long term goals..  Continue plan of care.  PT Short Term Goals Week 1:  PT Short Term Goal 1 (Week 1): pt will consistently perform transfers with supervision PT Short Term Goal 1 - Progress (Week 1): Progressing toward goal PT Short Term Goal 2 (Week 1): pt will perform gait 50' in controlled environment with supervision PT Short Term Goal 2 - Progress (Week 1): Progressing toward goal Week 2:  PT Short Term Goal 1 (Week 2): STG=LTG  Skilled Therapeutic Interventions/Progress Updates:  Ambulation/gait training;Balance/vestibular training;Community reintegration;Discharge planning;DME/adaptive equipment instruction;Functional electrical stimulation;Functional mobility training;Neuromuscular re-education;Pain management;Patient/family education;Skin care/wound management;Stair training;Therapeutic Activities;Splinting/orthotics;UE/LE Coordination activities;Therapeutic Exercise;UE/LE Strength taining/ROM;Wheelchair propulsion/positioning    See Function Navigator for Current Functional Status.   [name] 11/30/2016, 7:29 AM

## 2016-11-30 NOTE — Progress Notes (Signed)
Occupational Therapy Weekly Progress Note  Patient Details  Name: Debra [name] MRN: [id number] Date of Birth: 1965-12-19  Beginning of progress report period: November 23, 2016 End of progress report period: November 30, 2016   Short term goals not set due to estimated length of stay. Patient is progressing towards long term goals.  Progress is being limited secondary to multiple medical issues with fluctuating BP and blood glucose. Patient currently requires min A/min guard for LB dressing/bathing due to decreased standing balance and tolerance. Patient is able to complete squat pivot transfers with min guard/min A. Sit > stand min/mod A due to patient weakness and fatigue. Patient is able to maintain dynamic standing balance with UE support requiring min guard from therapist. Continue plan of care to focus on dynamic standing balance, functional mobility, functional transfers, and LB dressing/bathing to promote independence in self-care tasks and basic IADLs.  Continued education regarding current diagnosis and prognosis.  Patient continues to demonstrate the following deficits: muscle weakness, decreased cardiorespiratoy endurance, unbalanced muscle activation, decreased coordination and decreased motor planning, decreased visual motor skills and decreased standing balance, decreased postural control and decreased balance strategies and therefore will continue to benefit from skilled OT intervention to enhance overall performance with BADL, iADL and Reduce care partner burden.  See Patient's Care Plan for progression toward long term goals.  Patient progressing toward long term goals..  Continue plan of care.    Therapy Documentation Precautions:  Precautions Precautions: Fall Restrictions Weight Bearing Restrictions: No General:   Vital Signs: Therapy Vitals Temp: 98.3 F (36.8 C) Temp Source: Oral Pulse Rate: 89 Resp: 20 BP: (!) 157/77 (RN notified) Patient Position (if  appropriate): Lying Oxygen Therapy SpO2: 100 % O2 Device: Not Delivered Pain: Pain Assessment Pain Assessment: 0-10 Pain Score: 7  Pain Type: Acute pain Pain Location: Back Pain Orientation: Lower Pain Descriptors / Indicators: Aching Pain Frequency: Occasional Pain Onset: On-going Patients Stated Pain Goal: 0 Pain Intervention(s): Medication (See eMAR) ADL:  See Function Navigator  See Function Navigator for Current Functional Status.    [name] 11/30/2016, 3:26 PM

## 2016-11-30 NOTE — Progress Notes (Signed)
Occupational Therapy Session Note  Patient Details  Name: Debra [name] MRN: [id number] Date of Birth: 1966/02/10  Today's Date: 11/30/2016 OT Individual Time: 1130-1158 OT Individual Time Calculation (min): 28 min    Short Term Goals: Week 1:  OT Short Term Goal 1 (Week 1): STG = LTGs due to ELOS  Skilled Therapeutic Interventions/Progress Updates:    Treatment focus on sit > stand and standing balance to improve basic ADLs. Pt received in bed reporting fatigue and requesting to stay in room. Therapist challenged pt in sit > stand table top activity to facilitate standing balance and tolerance required for LB dressing/bathing and functional mobility. Pt completing activity with alternating UE support and min guard from therapist for balance. Therapist incorporating functional daily task (laundry; obtaining/putting away clothes) with pt to facilitate trunk rotation, weight shifting, and dynamic standing balance that is required for ADLs/IADLs. Pt completed task with min guard from therapist. Therapist providing pt education of transporting items with RW vs w/c with pt verbalizing understanding. Pt returned to bed with all needs in reach.  Therapy Documentation Precautions:  Precautions Precautions: Fall Restrictions Weight Bearing Restrictions: No Pain: C/o pain rating 9/10 in back but willing to participate in therapy session  See Function Navigator for Current Functional Status.   Therapy/Group: Individual Therapy  [name] 11/30/2016, 12:46 PM

## 2016-11-30 NOTE — Progress Notes (Signed)
Occupational Therapy Session Note  Patient Details  Name: Debra [name] MRN: [id number] Date of Birth: 03-13-1965  Today's Date: 11/30/2016 OT Individual Time: 4888-9169 OT Individual Time Calculation (min): 60 min    Short Term Goals: Week 1:  OT Short Term Goal 1 (Week 1): STG = LTGs due to ELOS  Skilled Therapeutic Interventions/Progress Updates:    Treatment focus on basic ADLs, LB dressing/bathing, and standing balance. Pt waiting for breakfast tray however willing to participate in session requesting to bathe sitting EOB. Lying > EOB with supervision. Therapist providing min guard for sit > stand and standing balance during LB bathing. Therapist encouraged pt to attempt half-circle sitting for LB dressing to decrease pain and promote independence; however pt refusing and reporting "I can't do that". Pt able to don footwear (ted hose and socks) by crossing LE with supervision and therapist noting improvement in LB dressing. Breakfast tray delivered during session with pt requesting to eat. Therapist discussed disease management while pt ate breakfast with education of coping skills, stress management, energy conservation techniques, safety at home, and support groups. Pt verbalized understanding and was able to identify 1 coping skill during discussion. Therapist providing encouragement throughout discussion to improve pt confidence. Pt sitting EOB finishing breakfast at end of session with all needs in reach.  Therapy Documentation Precautions:  Precautions Precautions: Fall Restrictions Weight Bearing Restrictions: No  Pain: C/o pain 7/10 in BLE and c/o weakness See Function Navigator for Current Functional Status.   Therapy/Group: Individual Therapy  [name] 11/30/2016, 12:03 PM

## 2016-11-30 NOTE — Progress Notes (Signed)
Physical Therapy Note  Patient Details  Name: Debra [name] MRN: [id number] Date of Birth: [birth year] Today's Date: 11/30/2016    Time: 1000-1055 55 minutes  1:1 No c/o pain.  Bed mobility with supervision, increased time.  Gait with RW 30', 25', 35' with min A with RW.  Stair training with 2 handrails 1 step 2 x 5 with min A.  Standing balance with 1 UE support with reaching and tossing horseshoes with pt able to stand 3 x 2 minutes with close supervision with 1 UE support.  Supine LTR and single knee to chest with manual facilitation for core and hip control.  Seated adduction squeeze with trunk PNF bilat, adduction squeeze with LAQ.  W/c mobility for UE strengthening 100' x 2.  Pt with no c/o nausea or dizziness during this session.   Treyvin Glidden 11/30/2016, 10:58 AM

## 2016-12-01 ENCOUNTER — Inpatient Hospital Stay (HOSPITAL_COMMUNITY): Payer: Self-pay | Admitting: Occupational Therapy

## 2016-12-01 ENCOUNTER — Inpatient Hospital Stay (HOSPITAL_COMMUNITY): Payer: Self-pay | Admitting: Physical Therapy

## 2016-12-01 ENCOUNTER — Inpatient Hospital Stay (HOSPITAL_COMMUNITY): Payer: Self-pay | Admitting: Speech Pathology

## 2016-12-01 DIAGNOSIS — D72829 Elevated white blood cell count, unspecified: Secondary | ICD-10-CM

## 2016-12-01 LAB — GLUCOSE, CAPILLARY
Glucose-Capillary: 42 mg/dL — CL (ref 65–99)
Glucose-Capillary: 78 mg/dL (ref 65–99)
Glucose-Capillary: 303 mg/dL — ABNORMAL HIGH (ref 65–99)
Glucose-Capillary: 174 mg/dL — ABNORMAL HIGH (ref 65–99)
Glucose-Capillary: 93 mg/dL (ref 65–99)

## 2016-12-01 MED ORDER — TAMSULOSIN HCL 0.4 MG PO CAPS
0.8000 mg | ORAL_CAPSULE | Freq: Every day | ORAL | Status: DC
  Administered 2016-12-02 – 2016-12-15 (×14): 0.8 mg via ORAL
  Filled 2016-12-01 (×14): qty 2

## 2016-12-01 NOTE — Progress Notes (Signed)
Hypoglycemic Event  CBG: 42  Treatment: 15 GM carbohydrate snack  Symptoms: Shaky and lightheaded  Follow-up CBG: [id number] CBG Result:78  Possible Reasons for Event: Unknown  Comments/MD notified:Dan Angiulli, PA notified    Debra [name] 12/01/2016

## 2016-12-01 NOTE — Progress Notes (Signed)
[place] [facility] PHYSICAL MEDICINE & REHABILITATION     PROGRESS NOTE    Subjective/Complaints: Pt seen sitting up at EOB this AM.  She is worried about discharge disposition.  She also notes symptomatic hypoglycemia.   Review of Systems  Respiratory: Negative for shortness of breath.   Cardiovascular: Negative for chest pain.  Gastrointestinal: Negative for diarrhea, nausea and vomiting.  Neurological: Positive for sensory change and focal weakness.  Psychiatric/Behavioral: Negative for depression and suicidal ideas.  All other systems reviewed and are negative.   Objective: Vital Signs: Blood pressure (!) 155/69, pulse 75, temperature 98.9 F (37.2 C), temperature source Oral, resp. rate 18, height  (1.626 m), weight 76.2 kg (168 lb 1.4 oz), SpO2 100 %. No results found. No results for input(s): WBC, HGB, HCT, PLT in the last 72 hours. No results for input(s): NA, K, CL, GLUCOSE, BUN, CREATININE, CALCIUM in the last 72 hours.  Invalid input(s): CO CBG (last 3)   Recent Labs  11/30/16 2109 12/01/16 0614 12/01/16 0648  GLUCAP 76 42* 78    Wt Readings from Last 3 Encounters:  11/29/16 76.2 kg (168 lb 1.4 oz)  11/18/16 76.2 kg (167 lb 15.9 oz)  11/07/16 68.4 kg (150 lb 12.8 oz)    Physical Exam:  Constitutional: She appears well-developed and well-nourished.  HENT: Normocephalic and atraumatic.  Eyes: EOM are normal. No discharge.  Cardiovascular: RRR. No JVD       Respiratory: CTA Bilaterally. Normal effort   GI: BS +, Non-distended  Musculoskeletal: No tenderness, no edema in extremities.  Neurological: She is alert and oriented.  Motor: B/l UE 5/5 proximal to distal RLE: HF 3+/5, KE 3+/5, ADF/PF 3+/5  LLE: HF, KE 3+/5, ADF/PF 4/5  Skin: Skin is warm and dry.  Psychiatric: pleasant  Assessment/Plan: 1. Functional and neurological deficits secondary to multiple sclerosis which require 3+ hours per day of interdisciplinary therapy in a comprehensive inpatient  rehab setting. Physiatrist is providing close team supervision and 24 hour management of active medical problems listed below. Physiatrist and rehab team continue to assess barriers to discharge/monitor patient progress toward functional and medical goals.  Function:  Bathing Bathing position   Position: Sitting EOB  Bathing parts Body parts bathed by patient: Right arm, Left arm, Chest, Abdomen, Front perineal area, Buttocks, Right upper leg, Left upper leg, Right lower leg, Left lower leg Body parts bathed by helper: Back  Bathing assist Assist Level: Supervision or verbal cues      Upper Body Dressing/Undressing Upper body dressing   What is the patient wearing?: Hospital gown Bra - Perfomed by patient: Thread/unthread right bra strap, Thread/unthread left bra strap, Hook/unhook bra (pull down sports bra)   Pull over shirt/dress - Perfomed by patient: Thread/unthread right sleeve, Thread/unthread left sleeve, Put head through opening, Pull shirt over trunk          Upper body assist Assist Level: Set up   Set up : To obtain clothing/put away  Lower Body Dressing/Undressing Lower body dressing   What is the patient wearing?: Ted Hose, Non-skid slipper socks   Underwear - Performed by helper: Thread/unthread right underwear leg, Thread/unthread left underwear leg Pants- Performed by patient: Pull pants up/down, Fasten/unfasten pants Pants- Performed by helper: Thread/unthread right pants leg, Thread/unthread left pants leg Non-skid slipper socks- Performed by patient: Don/doff right sock, Don/doff left sock Non-skid slipper socks- Performed by helper: Don/doff right sock, Don/doff left sock     Shoes - Performed by patient: Don/doff right  shoe, Don/doff left shoe Shoes - Performed by helper: Don/doff right shoe     TED Hose - Performed by patient: Don/doff right TED hose, Don/doff left TED hose TED Hose - Performed by helper: Don/doff right TED hose, Don/doff left TED hose   Lower body assist Assist for lower body dressing: Touching or steadying assistance (Pt > 75%)      Toileting Toileting   Toileting steps completed by patient: Performs perineal hygiene Toileting steps completed by helper: Adjust clothing prior to toileting, Performs perineal hygiene, Adjust clothing after toileting Toileting Assistive Devices: Grab bar or rail  Toileting assist Assist level: Touching or steadying assistance (Pt.75%)   Transfers Chair/bed transfer   Chair/bed transfer method: Stand pivot Chair/bed transfer assist level: Touching or steadying assistance (Pt > 75%) Chair/bed transfer assistive device: Patent attorney     Max distance: 25 Assist level: Touching or steadying assistance (Pt > 75%)   Wheelchair   Type: Manual Max wheelchair distance: 17' Assist Level: Supervision or verbal cues  Cognition Comprehension Comprehension assist level: Follows basic conversation/direction with extra time/assistive device  Expression Expression assist level: Expresses basic 90% of the time/requires cueing < 10% of the time.  Social Interaction Social Interaction assist level: Interacts appropriately 90% of the time - Needs monitoring or encouragement for participation or interaction.  Problem Solving Problem solving assist level: Solves basic 90% of the time/requires cueing < 10% of the time  Memory Memory assist level: Recognizes or recalls 90% of the time/requires cueing < 10% of the time   Medical Problem List and Plan: 1.  Decreased functional mobility with bilateral lower extremity weakness secondary to active demyelinating disease/multiple sclerosis.  Cont CIR 2.  DVT Prophylaxis/Anticoagulation: subcutaneous heparin. Monitor platelet counts any signs of bleeding 3. Pain Management/fibromyalgia: Neurontin 600 mg 3 times a day, Voltaren gel, oxycodone as needed for dysesthetic pain  -analgesics as above 4. Mood: provide emotional support 5.  Neuropsych: This patient is capable of making decisions on her own behalf. 6. Skin/Wound Care: Routine skin checks 7. Fluids/Electrolytes/Nutrition:    -encourage fluids---if further elevation will need IVF 8.Hypertension.  Norvasc 10 mg daily  Lisinopril changed to 20mg  BID on 10/2  Increased toprol to 75mg  daily on 9/29  Hydralazine 10 TID PRN started 10/2, increased to 25 on 10/3  Improving control 10/5, will consider further increase 9.Diabetes mellitus of peripheral neuropathy.Latest hemoglobin A1c of 14.    -NovoLog 8 units 3 times a day..   -lantus 35 AM  -D/ced PM lantus 10/5  Extremely labile with hypoglycemia in 40s again 10.Hyperlipidemia. Lipitor 11.Urinary retention. Removed foley  -voiding trial  -cath prn  Flomax started on 10/2, increased on 10/5  PVRs with Volumes ~1400ccs  Bethanechol 10 TID started 10/3, increased to 25 on 10/4   PVRs improving 12.Medical noncompliance. Provide counseling 13. AKI  Cr 1.30 on 10/1  Labs ordered for Monday  Cont to monitor 14. Sleep disturbance  PRN Trazodone started 10/1 15. Cognition  SLP eval ordered 16. Leukocytosis  Afebrile  WBCs 16.7 on 9/26  Labs ordered for Monday  LOS (Days) 9 A FACE TO FACE EVALUATION WAS PERFORMED  Alayha Babineaux Karis Juba, MD 12/01/2016 9:59 AM

## 2016-12-01 NOTE — Progress Notes (Signed)
Dr Allena Katz notified of night shift low blood sugars.

## 2016-12-01 NOTE — Progress Notes (Signed)
Occupational Therapy Session Note  Patient Details  Name: Debra [name] MRN: [id number] Date of Birth: 06-Nov-1965  Today's Date: 12/01/2016 OT Individual Time: 8416-6063 OT Individual Time Calculation (min): 41 min    Short Term Goals: Week 2:   STGs=LTGs due to remaining LOS  Skilled Therapeutic Interventions/Progress Updates:    Treatment focus on dynamic standing balance and standing tolerance to promote improvement in LB dressing, toilet hygiene/bathing, and functional transfers. Pt reported feeling "so so" however willing to participate in therapy session. Stand pivot transfer with RW bed > w/c with close supervision. Engaged in standing table top activity with and without UE support to simulate functional daily tasks to promote independence in basic self-care tasks and increase standing tolerance. Therapist challenged pt with balance pad during standing activity with pt demonstrating improved dynamic standing balance; however required frequent UE support and min guard/min A from therapist. Pt demonstrating LOB x1 and was able to correct balance with min A from therapist. Pt returned to bed with call bell in reach at end of session.  Therapy Documentation Precautions:  Precautions Precautions: Fall Restrictions Weight Bearing Restrictions: No Pain: Pain Assessment Pain Assessment: 0-10 Pain Score: 6  Pain Type: Acute pain Pain Location: Leg Pain Orientation: Right;Left Pain Descriptors / Indicators: Sharp Pain Intervention(s): RN made aware  See Function Navigator for Current Functional Status.   Therapy/Group: Individual Therapy  [name] 12/01/2016, 3:09 PM

## 2016-12-01 NOTE — Evaluation (Signed)
Speech Language Pathology Assessment and Plan  Patient Details  Name: Debra [name] MRN: [id number] Date of Birth: 05/26/1965  SLP Diagnosis: Cognitive Impairments  Rehab Potential: Good ELOS: 7-10 days     Today's Date: 12/01/2016 SLP Individual Time: 9485-4627 SLP Individual Time Calculation (min): 55 min   Problem List:  Patient Active Problem List   Diagnosis Date Noted  . Leukocytosis   . Labile blood glucose   . Hypertensive crisis   . Hypoglycemia   . Poorly controlled type 2 diabetes mellitus with peripheral neuropathy ([place])   . Uncontrolled hypertension   . Sleep disturbance   . Multiple sclerosis ([place]) 11/22/2016  . Thoracic root lesion 11/22/2016  . MS (multiple sclerosis) ([place])   . Neuropathic pain   . Uncontrolled type 2 diabetes mellitus with peripheral neuropathy ([place])   . Urinary retention   . Medically noncompliant   . Leg weakness, bilateral 11/20/2016  . Diabetic ketoacidosis without coma associated with type 1 diabetes mellitus ([place])   . Leg pain   . History of CVA (cerebrovascular accident)   . Uncontrolled type 1 diabetes mellitus with diabetic peripheral neuropathy ([place])   . Benign essential HTN   . Hypertensive urgency   . AKI (acute kidney injury) ([place])   . Fibromyalgia 11/07/2016  . Back pain 10/31/2016  . Stable angina pectoris ([place])   . Vitamin D deficiency 08/31/2014  . 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 Hyperlipidemia 10/06/2013  . Depression 10/16/2012  . Bilateral knee pain 02/16/2012  . Chronic diarrhea 10/19/2010  . Peripheral neuropathy 08/19/2010  . 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 Hypertension 07/06/2010  . Asthma 07/06/2010  . Migraine 07/06/2010   Past Medical History:  Past Medical History:  Diagnosis Date  . Adenomatous colonic polyps   . Anemia    2005  . Anxiety    1990  . Arthritis   . Asthma    2000  . Cataract   . Depression 1990  .  Difficult intubation    narrow airway  . Gastroesophageal Reflux Disease (GERD)   . Heart murmur    Birth  . Hyperlipidemia    2005  . Hypertension    1998  . Internal hemorrhoids 04/24/06   on colonoscopy  . Migraine   . Neuropathy of the hands & feet   . Restless Leg Syndrome   . Right Ankle Fracture 10/06/2013  . Sleep paralysis   . Stable angina pectoris    2007: cath showing normal cors.   . Stroke 1990  . Type I Diabetes Mellitus 1988   Past Surgical History:  Past Surgical History:  Procedure Laterality Date  . bilateral foot surgery    . BREAST SURGERY Left    biopsy left breast  . CARDIAC CATHETERIZATION  2007   around 2007 or 2008  . CESAREAN SECTION    . CHOLECYSTECTOMY    . COLONOSCOPY W/ BIOPSIES AND POLYPECTOMY    . DILATION AND CURETTAGE OF UTERUS  12/29/2010   Surgeon: Osborne Oman, MD;  Location: [facility];  Service: Gynecology  . ENDOMETRIAL ABLATION W/ NOVASURE N/A 12/2009  . EYE SURGERY    . HYSTEROSCOPY  12/29/2010   Procedure: HYSTEROSCOPY WITH HYDROTHERMAL ABLATION;  Surgeon: Osborne Oman, MD;  Location: [facility];  Service: Gynecology;;  . IUD REMOVAL  12/29/2010   Procedure: INTRAUTERINE DEVICE (IUD) REMOVAL;  Surgeon: Osborne Oman, MD;  Location: [facility];  Service: Gynecology;  Laterality: N/A;  . LAPAROSCOPIC TUBAL LIGATION  12/29/2010   Procedure: LAPAROSCOPIC TUBAL LIGATION;  Surgeon: Osborne Oman, MD;  Location: [facility];  Service: Gynecology;  Laterality: Bilateral;  . ORIF ANKLE FRACTURE Right 10/09/2013   Procedure: Open reduction internal fixation right ankle;  Surgeon: Nita Sells, MD;  Location: [facility];  Service: Orthopedics;  Laterality: Right;  Open reduction internal fixation right ankle  . TRIGGER FINGER RELEASE     x 3    Assessment / Plan / Recommendation Clinical Impression   Debra [name] is a 51 y.o. right handed female with history of CVA 1990 as well as gait dysfunction and patient was noncompliant with  medical follow-up, type 1 diabetes mellitus with neuropathy, DKA, fibromyalgia. Per chart review and patient, patient lives with her boyfriend. Boyfriend works during the day and no other local family can assist.  She was independent until approximately 3 weeks ago. Presented 11/15/2016 with progressive weakness bilateral lower extremities as well as back pain over the past 2 months and difficulty with ambulation as well as associated diarrhea.  UDS positive opiates.Thoracic MRI showed a 3 x 13 mm intramedullary nonenhancing lesion.  MRI 11/21/2016 of the brain completed showing multifocal cerebral white matter changes in a distribution most consistent with demyelinating disease, multiple sclerosis. No other acute intracranial abnormality. MRI cervical spine 11/21/2016 showed patchy cord signal abnormality extending from the craniocervical junction through the upper thoracic spine again consistent with demyelinating disease. Evidence for active demyelination at the level of C1-2 and C6.  IPatient was admitted for a comprehensive rehabilitation program.  SLP evaluation completed on 11/01/2016 with the following results:  Pt presents wtih mild memory deficits, most notably for new, complex information.  Expressive and receptive language appear intact.  Speech is fluent and free from dysarthria.  As a result, pt would benefit from skilled ST while inpatient in order to maximize functional independence and reduce burden of care prior to discharge.    Skilled Therapeutic Interventions          Cognitive-linguistic evaluation completed with results and recommendations reviewed with patient.   Pt scored WFL on subtests of the Cognistat assessing attention, problem solving, and reasonoing but needed up to mod assist verbal cues for 4 word recall subtest following a 5 minute delay.  Pt also needed min assist question cues for recall of her currently scheduled medications, even when medications were known to her from prior to  admission (per pt's report although chart indicates a history of medical noncompliance).  Discussed recommendations for brief ST follow up while inpatient to address memory compensation and remediation.  Pt verbalized understanding and was in agreement with plan of care.  All questions were answered to her satisfaction at this time.      SLP Assessment  Patient will need skilled [facility] during CIR admission    Recommendations  Recommendations for Other Services: Neuropsych consult Patient destination: Home Follow up Recommendations: None Equipment Recommended: None recommended by SLP    SLP Frequency 3 to 5 out of 7 days   SLP Duration  SLP Intensity  SLP Treatment/Interventions 7-10 days   Minumum of 1-2 x/day, 30 to 90 minutes  Cognitive remediation/compensation;Cueing hierarchy;Functional tasks;Internal/external aids;Environmental controls;Patient/family education    Pain Pain Assessment Pain Assessment: 0-10 Pain Score: 6  Pain Type: Acute pain Pain Location: Leg Pain Orientation: Right;Left Pain Descriptors / Indicators: Sharp Pain Frequency: Constant Pain Onset: On-going Pain Intervention(s): RN made aware  Prior Functioning Cognitive/Linguistic Baseline: Within functional limits Type of Home: House  Lives With: Significant other Available Help at Discharge: Family;Friend(s);Available PRN/intermittently Vocation: Part time employment  Function:  Eating Eating                 Cognition Comprehension Comprehension assist level: Follows basic conversation/direction with extra time/assistive device  Expression   Expression assist level: Expresses basic needs/ideas: With extra time/assistive device  Social Interaction Social Interaction assist level: Interacts appropriately 90% of the time - Needs monitoring or encouragement for participation or interaction.  Problem Solving Problem solving assist level: Solves basic 75 - 89% of  the time/requires cueing 10 - 24% of the time  Memory Memory assist level: Recognizes or recalls 75 - 89% of the time/requires cueing 10 - 24% of the time   Short Term Goals: Week 1: SLP Short Term Goal 1 (Week 1): STG=LTG due to ELOS for SLP   Refer to Care Plan for Long Term Goals  Recommendations for other services: Neuropsych  Discharge Criteria: Patient will be discharged from SLP if patient refuses treatment 3 consecutive times without medical reason, if treatment goals not met, if there is a change in medical status, if patient makes no progress towards goals or if patient is discharged from hospital.  The above assessment, treatment plan, treatment alternatives and goals were discussed and mutually agreed upon: by patient  Emilio Math 12/01/2016, 12:21 PM

## 2016-12-01 NOTE — Patient Care Conference (Signed)
Inpatient RehabilitationTeam Conference and Plan of Care Update Date: 11/29/2016   Time: 2:40 PM    Patient Name: Debra [name]      Medical Record Number: [id number]  Date of Birth: [birth year] Sex: Female         Room/Bed: 4W13C/4W13C-01 Payor Info: Payor: MEDICAID POTENTIAL / Plan: MEDICAID POTENTIAL / Product Type: *No Product type* /    Admitting Diagnosis: B lower EXT Weakness  Admit Date/Time:  11/22/2016  5:33 PM Admission Comments: No comment available   Primary Diagnosis:  <principal problem not specified> Principal Problem: <principal problem not specified>  Patient Active Problem List   Diagnosis Date Noted  . Leukocytosis   . Labile blood glucose   . Hypertensive crisis   . Hypoglycemia   . Poorly controlled type 2 diabetes mellitus with peripheral neuropathy ([facility])   . Uncontrolled hypertension   . Sleep disturbance   . Multiple sclerosis ([facility]) 11/22/2016  . Thoracic root lesion 11/22/2016  . MS (multiple sclerosis) ([facility])   . Neuropathic pain   . Uncontrolled type 2 diabetes mellitus with peripheral neuropathy ([facility])   . Urinary retention   . Medically noncompliant   . Leg weakness, bilateral 11/20/2016  . Diabetic ketoacidosis without coma associated with type 1 diabetes mellitus ([facility])   . Leg pain   . History of CVA (cerebrovascular accident)   . Uncontrolled type 1 diabetes mellitus with diabetic peripheral neuropathy ([facility])   . Benign essential HTN   . Hypertensive urgency   . AKI (acute kidney injury) ([facility])   . Fibromyalgia 11/07/2016  . Back pain 10/31/2016  . Stable angina pectoris ([facility])   . Vitamin D deficiency 08/31/2014  . 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 Hyperlipidemia 10/06/2013  . Depression 10/16/2012  . Bilateral knee pain 02/16/2012  . Chronic diarrhea 10/19/2010  . Peripheral neuropathy 08/19/2010  . 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 Hypertension 07/06/2010  . Asthma  07/06/2010  . Migraine 07/06/2010    Expected Discharge Date: Expected Discharge Date: 12/13/16  Team Members Present: Physician leading conference: Dr. Maryla Morrow Social Worker Present: Amada Jupiter, LCSW Nurse Present: Chrissie Noa, RN PT Present: Wanda Plump, PT OT Present: Rosalio Loud, OT PPS Coordinator present : Tora Duck, RN, CRRN     Current Status/Progress Goal Weekly Team Focus  Medical   Decreased functional mobility with bilateral lower extremity weakness secondary to active demyelinating disease/multiple sclerosis  Improve mobility, transfers, safety, uncontrolled HTN/DM, urinary retention, AKI, sleep  See above   Bowel/Bladder   In and out cath q8hrs for no void, cath at [facility] for 650 ml. Currently on flomax  To be able to void on her own  Assist patient to bathroom as needed   Swallow/Nutrition/ Hydration             ADL's   supervision/set-up UB bathing/dressing; min A/min guard LB bathing/dressing; min A functional transfers; min guard sit > stand; min A/min guard staning balance  mod I; supervision tub/shower transfer; supervision simple meal prep  dynamic standing balance; functional mobility; functional transfers; LB dressing and bathing   Mobility   mod assist bed mobility, min assist transfer with RW; as of 10/2: gait x 20' with min assist, RW; limited by fatigue, dizziness and nausea each tx session  Mod 1  activity tolerance, balance, strengthening, gait training, transfer training   Communication   A/O X4         Safety/Cognition/ Behavioral  Observations  A/O x4, one assist with transafer  No injuries  Continue with current activity without injuries   Pain   No C/O pain. Oxy IR ordered  <3  Pain control. Assess every shift and treat accordingly   Skin   No skin breakdown  Free from skin breakdown       Rehab Goals Patient on target to meet rehab goals: Yes *See Care Plan and progress notes for long and short-term goals.     Barriers to  Discharge  Current Status/Progress Possible Resolutions Date Resolved   Physician    Decreased caregiver support;Medical stability;New diabetic;Incontinence;Neurogenic Bowel & Bladder;Lack of/limited family support;Medication compliance     See above  Therapies, optimize HTN/BP meds, follow labs, bladder meds, improve sleep      Nursing                  PT                    OT                  SLP                SW                Discharge Planning/Teaching Needs:  Pt plans to return home with only intermittent support available which may be a concern for team.  Teaching TBD   Team Discussion:  Continue to make med adjustments for DM - need to encourage better po of food and fluids.  Therapies are noting cognitive issues and plan to refer for ST eval.  Had incont bm today.  Goals being set for modified independent. currnetly min assist with amb but easily fatigued.  Revisions to Treatment Plan:  ST eval ordered    Continued Need for Acute Rehabilitation Level of Care: The patient requires daily medical management by a physician with specialized training in physical medicine and rehabilitation for the following conditions: Daily direction of a multidisciplinary physical rehabilitation program to ensure safe treatment while eliciting the highest outcome that is of practical value to the patient.: Yes Daily medical management of patient stability for increased activity during participation in an intensive rehabilitation regime.: Yes Daily analysis of laboratory values and/or radiology reports with any subsequent need for medication adjustment of medical intervention for : Neurological problems;Diabetes problems;Blood pressure problems;Renal problems;Mood/behavior problems;Urological problems  Merwyn Hodapp 11/30/2016, 11:37 AM

## 2016-12-01 NOTE — Progress Notes (Signed)
Physical Therapy Note  Patient Details  Name: Debra [name] MRN: [id number] Date of Birth: 12/12/1965 Today's Date: 12/01/2016    Time: 1100-1150 50 minutes  1:1 Pt states her back hurts "a little" but is willing to participate in therapy. Gait in controlled environment with RW close supervision 2 x 35'.  Gait through obstacles and on uneven surface 3 x 25' with min A.  Standing balance/ tolerance with card matching task with pt able to stand 2 x 6 minutes.  nustep for LE strength and endurance level 4 x 6 minutes.  Supine <> sit training without use of bed rail with supervision, increased time.   [name],[name] 12/01/2016, 12:20 PM

## 2016-12-01 NOTE — Progress Notes (Signed)
Social Work Patient ID: Debra [name], female   DOB: 03-14-1965, 51 y.o.   MRN: [id number]   Alerted by ST and MD note that pt now reports that her ex-boyfriend was by her room yesterday evening and informed her that she is not welcome to return to his home at d/c. Questioned her about her own apt and returning there.  She states that her ex-boyfriend is the landlord of that property and won't allow her to return there either.  We discussed other potential d/c options including her local children and aunt and she states that they are unable to offer their homes "because they are full with people already."  I have asked her to continue to work on finding an alternative d/c location.  We also discussed if SNF might be an option.  Pt does not have any insurance coverage and financial counselors continue to monitor if her current level of function would meet SSD eligibility.  Pt is agreeable to SNF, however, I need to determine if this is truly an option given her functional gains and if SSD/ MA apps are appropriate.  Very difficulty situation.  Will keep team posted.  Emery Dupuy, LCSW

## 2016-12-01 NOTE — Progress Notes (Signed)
Occupational Therapy Session Note  Patient Details  Name: Debra [name] MRN: [id number] Date of Birth: 1965/04/18  Today's Date: 12/01/2016 OT Individual Time: 1829-9371 OT Individual Time Calculation (min): 42 min    Short Term Goals: Week 2:   STGs=LTGs due to remaining LOS  Skilled Therapeutic Interventions/Progress Updates:    Treatment focus on self-care/ADLs and standing balance to progress towards LTGs. Pt reporting low blood glucose this AM and stated "feeling weak" but willing to participate sitting EOB for self-care tasks. Engaged in bathing EOB sit > stand level with therapist providing encouragement and close supervision for standing balance with no UE support. Pt able to tolerate task requiring frequent rest breaks due to low blood glucose. Therapist encouraged pt to attempt donning TED hose with pt demonstrating ability to complete with supervision. UB/LB dressing completed with close supervision from therapist. Therapist providing min guard for standing balance during LB dressing to ensure pt safety due to pt current status. Pt sitting EOB with breakfast tray and RN in room at end of session.  Therapy Documentation Precautions:  Precautions Precautions: Fall Restrictions Weight Bearing Restrictions: No General:   Vital Signs:  Pain: No c/o pain at beginning of session; however during session pt c/o pain in BLE/hips rating 5/10   See Function Navigator for Current Functional Status.   Therapy/Group: Individual Therapy  [name] 12/01/2016, 10:40 AM

## 2016-12-02 ENCOUNTER — Inpatient Hospital Stay (HOSPITAL_COMMUNITY): Payer: Self-pay | Admitting: Occupational Therapy

## 2016-12-02 LAB — GLUCOSE, CAPILLARY
GLUCOSE-CAPILLARY: 119 mg/dL — AB (ref 65–99)
GLUCOSE-CAPILLARY: 122 mg/dL — AB (ref 65–99)
GLUCOSE-CAPILLARY: 156 mg/dL — AB (ref 65–99)
GLUCOSE-CAPILLARY: 23 mg/dL — AB (ref 65–99)
GLUCOSE-CAPILLARY: 224 mg/dL — AB (ref 65–99)
Glucose-Capillary: 40 mg/dL — CL (ref 65–99)
Glucose-Capillary: 440 mg/dL — ABNORMAL HIGH (ref 65–99)
Glucose-Capillary: 246 mg/dL — ABNORMAL HIGH (ref 65–99)

## 2016-12-02 LAB — GLUCOSE, RANDOM: GLUCOSE: 191 mg/dL — AB (ref 65–99)

## 2016-12-02 MED ORDER — GLUCAGON HCL RDNA (DIAGNOSTIC) 1 MG IJ SOLR
INTRAMUSCULAR | Status: AC
  Administered 2016-12-02: 1 mg
  Filled 2016-12-02: qty 1

## 2016-12-02 MED ORDER — DEXTROSE 50 % IV SOLN
INTRAVENOUS | Status: AC
  Filled 2016-12-02: qty 50

## 2016-12-02 MED ORDER — DEXTROSE 50 % IV SOLN
50.0000 mL | Freq: Once | INTRAVENOUS | Status: AC
  Administered 2016-12-02: 11:00:00 via INTRAVENOUS

## 2016-12-02 NOTE — Progress Notes (Signed)
Occupational Therapy Session Note  Patient Details  Name: Debra [name] MRN: [id number] Date of Birth: [birth year]  Today's Date: 12/02/2016 OT Individual Time: 9562-1308 OT Individual Time Calculation (min): 55 min    Short Term Goals: Week 2:   STG = LTGs  Skilled Therapeutic Interventions/Progress Updates:    Treatment session with focus on functional mobility and endurance to complete self-care tasks.  Pt BP and blood sugar WNL and pt reporting feeling well therefore pt willing to engage in bathing at shower level. Ambulated to room shower with RW and min assist, one instance of knee buckling with pt able to recover while therapist maintaining min assist.  Completed bathing with supervision -min guard when standing to wash buttocks.  Exited shower to w/c with min assist squat pivot.  Pt donned shirt and was attempting to don TEDS when pt became increasingly slowed in her movements.  She requested assist to don TEDS and pants.  Attempted sit > stand to pull pants over hips with pt unable to achieve full upright standing despite max assist from therapist.  Pt with increased sweating and delayed responses to therapist.    RN called to assess vitals, question decreased blood sugar.  +2 squat pivot back to bed due to decreased arousal.  Blood sugar obtained: 23.  Additional RN in to assist with care.  Therapy Documentation Precautions:  Precautions Precautions: Fall Restrictions Weight Bearing Restrictions: No Pain:  Pt with no c/o pain  See Function Navigator for Current Functional Status.   Therapy/Group: Individual Therapy  Rosalio Loud 12/02/2016, 10:44 AM

## 2016-12-02 NOTE — Progress Notes (Signed)
Patient ID: Debra [name], female   DOB: Jan 04, 1966, 51 y.o.   MRN: [id number]   12/02/2016.  Debra [name] is a 51 y.o. female who is admitted for CIR with  decreased functional mobility with bilateral lower extremity weakness secondary to active demyelinating disease/multiple sclerosis.  Past Medical History:  Diagnosis Date  . Adenomatous colonic polyps   . Anemia    2005  . Anxiety    1990  . Arthritis   . Asthma    2000  . Cataract   . Depression 1990  . Difficult intubation    narrow airway  . Gastroesophageal Reflux Disease (GERD)   . Heart murmur    Birth  . Hyperlipidemia    2005  . Hypertension    1998  . Internal hemorrhoids 04/24/06   on colonoscopy  . Migraine   . Neuropathy of the hands & feet   . Restless Leg Syndrome   . Right Ankle Fracture 10/06/2013  . Sleep paralysis   . Stable angina pectoris    2007: cath showing normal cors.   . Stroke 1990  . Type I Diabetes Mellitus 1988      Subjective: No new complaints. No new problems.  Concerned about nighttime hypoglycemia if she doesn't get a meal time snack.  Objective: Vital signs in last 24 hours: Temp:  [97.5 F (36.4 C)-98.4 F (36.9 C)] 97.5 F (36.4 C) (10/06 0458) Pulse Rate:  [74-79] 74 (10/06 0458) Resp:  [18] 18 (10/06 0458) BP: (134-162)/(56-77) 162/77 (10/06 0458) SpO2:  [94 %-99 %] 99 % (10/06 0458) Weight change:  Last BM Date: 11/29/16  Intake/Output from previous day: 10/05 0701 - 10/06 0700 In: 680 [P.O.:680] Out: 1975 [Urine:1975] Last cbgs: CBG (last 3)   Recent Labs  12/02/16 0618 12/02/16 0631 12/02/16 0657  GLUCAP 119* 122* 156*   BP Readings from Last 3 Encounters:  12/02/16 (!) 162/77  11/22/16 (!) 165/69  11/07/16 (!) 167/91    Physical Exam General: No apparent distress   HEENT: not dry Lungs: Normal effort. Lungs clear to auscultation, no crackles or wheezes. Cardiovascular: Regular rate and rhythm, no edema Abdomen: S/NT/ND;  BS(+) Musculoskeletal:  unchanged Neurological: No new neurological deficits; lower extremity weakness, right greater than left Wounds: N/A    Skin: clear   Mental state: Alert, oriented, cooperative    Lab Results: BMET    Component Value Date/Time   NA 137 11/27/2016 0821   NA 135 04/21/2015 0956   K 4.7 11/27/2016 0821   CL 102 11/27/2016 0821   CO2 28 11/27/2016 0821   GLUCOSE 195 (H) 11/27/2016 0821   BUN 30 (H) 11/27/2016 0821   BUN 11 04/21/2015 0956   CREATININE 1.30 (H) 11/27/2016 0821   CREATININE 0.79 08/28/2014 1223   CALCIUM 8.4 (L) 11/27/2016 0821   GFRNONAA 47 (L) 11/27/2016 0821   GFRNONAA 89 08/28/2014 1223   GFRAA 55 (L) 11/27/2016 0821   GFRAA >89 08/28/2014 1223   CBC    Component Value Date/Time   WBC 16.7 (H) 11/22/2016 1941   RBC 4.50 11/22/2016 1941   HGB 13.0 11/22/2016 1941   HCT 39.3 11/22/2016 1941   PLT 303 11/22/2016 1941   MCV 87.3 11/22/2016 1941   MCH 28.9 11/22/2016 1941   MCHC 33.1 11/22/2016 1941   RDW 14.3 11/22/2016 1941   LYMPHSABS 1.0 11/22/2016 1941   MONOABS 0.3 11/22/2016 1941   EOSABS 0.0 11/22/2016 1941   BASOSABS 0.0 11/22/2016 1941  Studies/Results: No results found.  Medications: I have reviewed the patient's current medications.  Assessment/Plan:  Decreased functional mobility with bilateral lower extremity weakness secondary to active demyelinating disease/multiple sclerosis.             Cont CIR Essential hypertension.  Multi-drug-resistant.  Medications have been titrated and readings seem to be improving.  We'll continue to observe and consider further titration Diabetes mellitus with peripheral neuropathy.  Remains labile.   Dyslipidemia.  Continue statin therapy    Length of stay, days: 10  Debra [name] , MD 12/02/2016, 10:20 AM

## 2016-12-03 ENCOUNTER — Inpatient Hospital Stay (HOSPITAL_COMMUNITY): Payer: Self-pay | Admitting: Physical Therapy

## 2016-12-03 LAB — GLUCOSE, CAPILLARY
GLUCOSE-CAPILLARY: 314 mg/dL — AB (ref 65–99)
GLUCOSE-CAPILLARY: 259 mg/dL — AB (ref 65–99)
GLUCOSE-CAPILLARY: 198 mg/dL — AB (ref 65–99)
GLUCOSE-CAPILLARY: 301 mg/dL — AB (ref 65–99)

## 2016-12-03 MED ORDER — INSULIN ASPART 100 UNIT/ML ~~LOC~~ SOLN
0.0000 [IU] | Freq: Every day | SUBCUTANEOUS | Status: DC
  Administered 2016-12-03 – 2016-12-05 (×2): 4 [IU] via SUBCUTANEOUS
  Administered 2016-12-06: 3 [IU] via SUBCUTANEOUS
  Administered 2016-12-07: 4 [IU] via SUBCUTANEOUS
  Administered 2016-12-09: 5 [IU] via SUBCUTANEOUS
  Administered 2016-12-10 – 2016-12-11 (×2): 4 [IU] via SUBCUTANEOUS
  Administered 2016-12-12: 5 [IU] via SUBCUTANEOUS
  Administered 2016-12-14: 3 [IU] via SUBCUTANEOUS

## 2016-12-03 MED ORDER — INSULIN ASPART 100 UNIT/ML ~~LOC~~ SOLN
0.0000 [IU] | Freq: Three times a day (TID) | SUBCUTANEOUS | Status: DC
  Administered 2016-12-04: 5 [IU] via SUBCUTANEOUS
  Administered 2016-12-04: 2 [IU] via SUBCUTANEOUS

## 2016-12-03 NOTE — Progress Notes (Signed)
Physical Therapy Note  Patient Details  Name: Debra [name] MRN: [id number] Date of Birth: Jan 24, 1966 Today's Date: 12/03/2016    Time: 1115-1145 30 minutes  1:1 no c/o pain. Pt's blood sugar 314 at last check. Pt states she feels "fine" and is agreeable to treatment. Pt performs gait 50', 6' with RW with min guard.  Step ups for balance and strengthening to 6'' step with bilat handrails 2 x 10 with min A.  Standing cone taps alternating LEs and then tapping 2 cones for crossing midline with LEs. All with 1 UE support and min/mod A.  Pt then reports feeling "dizzy" and that "they should check my sugar".  Pt back to bed with mod A. Blood sugar 259 (dropped from 314).  PT encouraged pt that it was good for her to keep letting staff know when she feels her sugar might be changing. Pt left in bed with needs at hand.   Tinzley Dalia 12/03/2016, 12:29 PM

## 2016-12-03 NOTE — Progress Notes (Signed)
Patient ID: Debra [name], female   DOB: 26-Mar-1965, 51 y.o.   MRN: [id number]   12/03/16.  Debra [name] is a 51 y.o. female who is admitted for CIR with functional deficits with lower extremity weakness and mobility deficits secondary to multiple sclerosis  Past Medical History:  Diagnosis Date  . Adenomatous colonic polyps   . Anemia    2005  . Anxiety    1990  . Arthritis   . Asthma    2000  . Cataract   . Depression 1990  . Difficult intubation    narrow airway  . Gastroesophageal Reflux Disease (GERD)   . Heart murmur    Birth  . Hyperlipidemia    2005  . Hypertension    1998  . Internal hemorrhoids 04/24/06   on colonoscopy  . Migraine   . Neuropathy of the hands & feet   . Restless Leg Syndrome   . Right Ankle Fracture 10/06/2013  . Sleep paralysis   . Stable angina pectoris    2007: cath showing normal cors.   . Stroke 1990  . Type I Diabetes Mellitus 1988     Subjective: No new complaints.  Medical problems include type 1 diabetes mellitus which has been quite labile.  Yesterday after eating well at lunch, she received a total of 10 units of mealtime insulin, and suffered a significant hypoglycemic reaction with a blood sugar of 29.  Hypoglycemia protocol required with IM glucagon and IV 50% dextrose.  She feels well this morning.  Her appetite continues to be normal and her oral intake is good. Blood sugars have remained elevated since the hypoglycemic protocol initiated.  Patient remains on basal and mealtime insulin with sliding scale coverage presently on hold.  Objective: Vital signs in last 24 hours: Temp:  [97.8 F (36.6 C)-98.7 F (37.1 C)] 98.7 F (37.1 C) (10/07 0250) Pulse Rate:  [73-77] 77 (10/07 0250) Resp:  [18] 18 (10/07 0250) BP: (149-182)/(67-78) 182/78 (10/07 0250) SpO2:  [95 %-100 %] 95 % (10/07 0250) Weight change:  Last BM Date: 11/29/16  Intake/Output from previous day: 10/06 0701 - 10/07 0700 In: 480 [P.O.:480] Out: 2500  [Urine:2500] Last cbgs: CBG (last 3)   Recent Labs  12/02/16 1715 12/02/16 2101 12/03/16 0643  GLUCAP 440* 246* 314*   BP Readings from Last 3 Encounters:  12/03/16 (!) 182/78  11/22/16 (!) 165/69  11/07/16 (!) 167/91    Physical Exam General: No apparent distress.  Awake and alert and feeding herself breakfast HEENT: not dry Lungs: Normal effort. Lungs clear to auscultation, no crackles or wheezes. Cardiovascular: Regular rate and rhythm, no edema Abdomen: S/NT/ND; BS(+) Musculoskeletal:  unchanged Neurological: No new neurological deficits with right greater than left lower extremity weakness Wounds: N/A    Extremities.  No edema Mental state: Alert, oriented, cooperative    Lab Results: BMET    Component Value Date/Time   NA 137 11/27/2016 0821   NA 135 04/21/2015 0956   K 4.7 11/27/2016 0821   CL 102 11/27/2016 0821   CO2 28 11/27/2016 0821   GLUCOSE 191 (H) 12/02/2016 1229   BUN 30 (H) 11/27/2016 0821   BUN 11 04/21/2015 0956   CREATININE 1.30 (H) 11/27/2016 0821   CREATININE 0.79 08/28/2014 1223   CALCIUM 8.4 (L) 11/27/2016 0821   GFRNONAA 47 (L) 11/27/2016 0821   GFRNONAA 89 08/28/2014 1223   GFRAA 55 (L) 11/27/2016 0821   GFRAA >89 08/28/2014 1223   CBC  Component Value Date/Time   WBC 16.7 (H) 11/22/2016 1941   RBC 4.50 11/22/2016 1941   HGB 13.0 11/22/2016 1941   HCT 39.3 11/22/2016 1941   PLT 303 11/22/2016 1941   MCV 87.3 11/22/2016 1941   MCH 28.9 11/22/2016 1941   MCHC 33.1 11/22/2016 1941   RDW 14.3 11/22/2016 1941   LYMPHSABS 1.0 11/22/2016 1941   MONOABS 0.3 11/22/2016 1941   EOSABS 0.0 11/22/2016 1941   BASOSABS 0.0 11/22/2016 1941    Studies/Results: No results found.  Medications: I have reviewed the patient's current medications.  Assessment/Plan:  Lower extremity weakness and functional deficits secondary to multiple sclerosis Diabetes mellitus with peripheral neuropathy.  Remains quite labile.  SSI coverage  presently on hold Dyslipidemia.  Continue statin therapy Essential hypertension.  Systolic readings slightly elevated.  We'll continue to observe    Length of stay, days: 11  Rogelia Boga , MD 12/03/2016, 10:51 AM

## 2016-12-04 ENCOUNTER — Inpatient Hospital Stay (HOSPITAL_COMMUNITY): Payer: Self-pay | Admitting: Physical Therapy

## 2016-12-04 ENCOUNTER — Inpatient Hospital Stay (HOSPITAL_COMMUNITY): Payer: Self-pay | Admitting: Occupational Therapy

## 2016-12-04 ENCOUNTER — Inpatient Hospital Stay (HOSPITAL_COMMUNITY): Payer: Self-pay | Admitting: Speech Pathology

## 2016-12-04 DIAGNOSIS — D62 Acute posthemorrhagic anemia: Secondary | ICD-10-CM

## 2016-12-04 LAB — GLUCOSE, CAPILLARY
GLUCOSE-CAPILLARY: 149 mg/dL — AB (ref 65–99)
GLUCOSE-CAPILLARY: 43 mg/dL — AB (ref 65–99)
GLUCOSE-CAPILLARY: 109 mg/dL — AB (ref 65–99)
GLUCOSE-CAPILLARY: 81 mg/dL (ref 65–99)
GLUCOSE-CAPILLARY: 287 mg/dL — AB (ref 65–99)
GLUCOSE-CAPILLARY: 277 mg/dL — AB (ref 65–99)
Glucose-Capillary: 247 mg/dL — ABNORMAL HIGH (ref 65–99)
Glucose-Capillary: 122 mg/dL — ABNORMAL HIGH (ref 65–99)
Glucose-Capillary: 38 mg/dL — CL (ref 65–99)

## 2016-12-04 LAB — CBC WITH DIFFERENTIAL/PLATELET
BASOS ABS: 0 10*3/uL (ref 0.0–0.1)
Basophils Relative: 0 %
EOS PCT: 4 %
Eosinophils Absolute: 0.3 10*3/uL (ref 0.0–0.7)
HEMATOCRIT: 32.8 % — AB (ref 36.0–46.0)
Hemoglobin: 10.4 g/dL — ABNORMAL LOW (ref 12.0–15.0)
LYMPHS ABS: 1.9 10*3/uL (ref 0.7–4.0)
LYMPHS PCT: 22 %
MCH: 29.1 pg (ref 26.0–34.0)
MCHC: 31.7 g/dL (ref 30.0–36.0)
MCV: 91.6 fL (ref 78.0–100.0)
MONO ABS: 0.5 10*3/uL (ref 0.1–1.0)
Monocytes Relative: 6 %
NEUTROS ABS: 5.9 10*3/uL (ref 1.7–7.7)
Neutrophils Relative %: 68 %
PLATELETS: 229 10*3/uL (ref 150–400)
RBC: 3.58 MIL/uL — ABNORMAL LOW (ref 3.87–5.11)
RDW: 14.8 % (ref 11.5–15.5)
WBC: 8.7 10*3/uL (ref 4.0–10.5)

## 2016-12-04 LAB — BASIC METABOLIC PANEL
Anion gap: 8 (ref 5–15)
BUN: 30 mg/dL — AB (ref 6–20)
CO2: 28 mmol/L (ref 22–32)
Calcium: 8.7 mg/dL — ABNORMAL LOW (ref 8.9–10.3)
Chloride: 99 mmol/L — ABNORMAL LOW (ref 101–111)
Creatinine, Ser: 1.54 mg/dL — ABNORMAL HIGH (ref 0.44–1.00)
GFR, EST AFRICAN AMERICAN: 45 mL/min — AB (ref >60)
GFR, EST NON AFRICAN AMERICAN: 39 mL/min — AB (ref >60)
GLUCOSE: 254 mg/dL — AB (ref 65–99)
Potassium: 4.9 mmol/L (ref 3.5–5.1)
SODIUM: 135 mmol/L (ref 135–145)

## 2016-12-04 MED ORDER — INSULIN ASPART 100 UNIT/ML ~~LOC~~ SOLN
3.0000 [IU] | Freq: Three times a day (TID) | SUBCUTANEOUS | Status: DC
  Administered 2016-12-04 – 2016-12-13 (×22): 3 [IU] via SUBCUTANEOUS

## 2016-12-04 MED ORDER — DEXTROSE 50 % IV SOLN
INTRAVENOUS | Status: AC
  Administered 2016-12-04: 16:00:00
  Filled 2016-12-04: qty 50

## 2016-12-04 MED ORDER — INSULIN GLARGINE 100 UNIT/ML ~~LOC~~ SOLN
30.0000 [IU] | Freq: Every day | SUBCUTANEOUS | Status: DC
  Filled 2016-12-04: qty 0.3

## 2016-12-04 MED ORDER — BETHANECHOL CHLORIDE 25 MG PO TABS
50.0000 mg | ORAL_TABLET | Freq: Three times a day (TID) | ORAL | Status: DC
  Administered 2016-12-04 – 2016-12-15 (×34): 50 mg via ORAL
  Filled 2016-12-04 (×35): qty 2

## 2016-12-04 MED ORDER — GLUCOSE 40 % PO GEL
ORAL | Status: AC
  Administered 2016-12-04: 16:00:00
  Filled 2016-12-04: qty 1

## 2016-12-04 MED ORDER — INSULIN ASPART 100 UNIT/ML ~~LOC~~ SOLN
0.0000 [IU] | Freq: Three times a day (TID) | SUBCUTANEOUS | Status: DC
  Administered 2016-12-04: 2 [IU] via SUBCUTANEOUS
  Administered 2016-12-05 (×2): 5 [IU] via SUBCUTANEOUS
  Administered 2016-12-05 – 2016-12-06 (×2): 2 [IU] via SUBCUTANEOUS
  Administered 2016-12-06 – 2016-12-07 (×2): 3 [IU] via SUBCUTANEOUS
  Administered 2016-12-08: 8 [IU] via SUBCUTANEOUS
  Administered 2016-12-08: 2 [IU] via SUBCUTANEOUS
  Administered 2016-12-09: 3 [IU] via SUBCUTANEOUS
  Administered 2016-12-09: 2 [IU] via SUBCUTANEOUS
  Administered 2016-12-09 – 2016-12-11 (×5): 3 [IU] via SUBCUTANEOUS
  Administered 2016-12-11: 2 [IU] via SUBCUTANEOUS
  Administered 2016-12-11: 5 [IU] via SUBCUTANEOUS
  Administered 2016-12-12: 2 [IU] via SUBCUTANEOUS
  Administered 2016-12-12: 3 [IU] via SUBCUTANEOUS
  Administered 2016-12-12: 5 [IU] via SUBCUTANEOUS
  Administered 2016-12-13: 3 [IU] via SUBCUTANEOUS
  Administered 2016-12-13: 2 [IU] via SUBCUTANEOUS
  Administered 2016-12-14: 5 [IU] via SUBCUTANEOUS
  Administered 2016-12-14 (×2): 3 [IU] via SUBCUTANEOUS
  Administered 2016-12-15: 2 [IU] via SUBCUTANEOUS
  Administered 2016-12-15: 5 [IU] via SUBCUTANEOUS
  Administered 2016-12-15: 2 [IU] via SUBCUTANEOUS

## 2016-12-04 MED ORDER — INSULIN GLARGINE 100 UNIT/ML ~~LOC~~ SOLN
5.0000 [IU] | Freq: Every day | SUBCUTANEOUS | Status: DC
  Administered 2016-12-04 – 2016-12-05 (×2): 5 [IU] via SUBCUTANEOUS
  Filled 2016-12-04 (×2): qty 0.05

## 2016-12-04 NOTE — Progress Notes (Signed)
Physical Therapy Note  Patient Details  Name: COLLENE TIBBLES MRN: [id number] Date of Birth: August 23, 1965 Today's Date: 12/04/2016    Time: 1100-1125 25 minutes  1:1 Pt c/o hip pain at end of session, RN made aware.  Pt performed standing x 10 HS curls, hip abd, heel raises, mini squats.  Seated x 10 LAQ. Pt then c/o dizziness and nausea.  Blood sugar 122, BP 123/55.  Pt states "that is low for me".  RN and PT educated pt that her body is not used to being in "normal" range and that it will take time to adjust. Pt asks to lay down and not continue PT session.  Pt left in bed with needs at hand, RN present.   Toccara Alford 12/04/2016, 11:27 AM

## 2016-12-04 NOTE — Significant Event (Signed)
Rapid Response Event Note  Overview: Time Called: 1310 Arrival Time: 1313 Event Type: Other (Comment)  Initial Focused Assessment: Staff alerted by family member that patient not feeling well.  CBG 43 Glucose gel given Patient became minimally responsive, diaphoretic. CBG 33,  Repeat 38 Lung sounds clear, breathing regularly.  Patient able to open eyes to voice.  Interventions: IV placed by IV team 1 amp D 50 given IV CBG 149.  Patient not as diaphoretic, able to cough on command but still very weak and poorly responsive. 1330:  CBG 109  Patient given glucose gel 1350   CBG 81  Patient now more awake and able to eat lunch. No diaphoresis Lunch provided Patient's mental status continued to improve. Patient's Insulin regimen adjusted.   Plan of Care (if not transferred): Re check CBG after patient finishes lunch.  Call RRT if patient becomes symptomatic again.  Recommended Diabetic Coordinator consult.  1655  CBG 287  Event Summary: Name of Physician Notified: Dan Angiulli at 1300    at    Outcome: Stayed in room and stabalized  Event End Time: 1400  Marcellina Millin

## 2016-12-04 NOTE — Progress Notes (Signed)
Speech Language Pathology Daily Session Note  Patient Details  Name: Debra [name] MRN: [id number] Date of Birth: 31-Mar-1965  Today's Date: 12/04/2016 SLP Individual Time: 0850-0930 SLP Individual Time Calculation (min): 40 min  Short Term Goals: Week 1: SLP Short Term Goal 1 (Week 1): STG=LTG due to ELOS for SLP   Skilled Therapeutic Interventions:  Pt was seen for skilled ST targeting cognitive goals.  SLP facilitated the session with medication management tasks to address recall and ongoing diagnostic treatment of higher level problem solving.  Pt was 100% accurate for recall of function of currently scheduled medications when named with mod I.  Pt was also >90% accurate with mod I for organizing pills into a 4x daily pill box.  Discussed benefits of using a pill box during medication management at home.  Pt verbalized understanding and was agreeable to using pill box upon discharge.  Pt was left in wheelchair with call bell within reach.  Continue per current plan of care.    Function:  Eating Eating                 Cognition Comprehension Comprehension assist level: Follows complex conversation/direction with extra time/assistive device  Expression   Expression assist level: Expresses complex ideas: With extra time/assistive device  Social Interaction Social Interaction assist level: Interacts appropriately with others with medication or extra time (anti-anxiety, antidepressant).  Problem Solving Problem solving assist level: Solves basic problems with no assist  Memory Memory assist level: More than reasonable amount of time    Pain Pain Assessment Pain Assessment: No/denies pain  Therapy/Group: Individual Therapy  Jenniffer Vessels, Melanee Spry 12/04/2016, 9:32 AM

## 2016-12-04 NOTE — Progress Notes (Signed)
Occupational Therapy Session Note  Patient Details  Name: Debra [name] MRN: [id number] Date of Birth: 1965-05-28  Today's Date: 12/04/2016 OT Individual Time: 1100-1157 OT Individual Time Calculation (min): 57 min    Short Term Goals: Week 2:  OT Short Term Goal 1 (Week 2): STG = LTGs  Skilled Therapeutic Interventions/Progress Updates:    Tx focus on functional transfers, dynamic balance, and standing endurance during functional tasks.   Pt greeted in w/c, already bathed and dressed. Ready for tx. She engaged in various activities in gym and therapy apartment working on standing balance without UE support, tub bench transfers, and functional ambulation in kitchen with RW and Min A during simulated meal prep. Issued her walker bag and educated her on energy conservation techniques. Pt requiring multiple rest breaks throughout due to LE pain. At end of tx pt was left in gym, anticipating next therapist in a few minutes. Next therapist made aware of pts position.    Therapy Documentation Precautions:  Precautions Precautions: Fall Restrictions Weight Bearing Restrictions: No  Pain: c/o LE pain when standing for >3 minutes. Pain subsided with rest.  Pain Assessment Pain Assessment: No/denies pain ADL:      See Function Navigator for Current Functional Status.   Therapy/Group: Individual Therapy  Arnav Cregg A Carrolyn Hilmes 12/04/2016, 12:29 PM

## 2016-12-04 NOTE — Progress Notes (Signed)
Social Work Patient ID: Debra [name], female   DOB: Jul 21, 1965, 51 y.o.   MRN: [id number]   Have met with pt this morning to further discuss d/c planning issues.  Per financial counseling notes, pt not yet meeting [place] criteria under [place] and they continue to monitor her case.  I did speak with Laurence Slate (financial counselor) this morning and requested that she look further into her current notes/ functional reports.   Explained to pt that SNF/ RH is not a d/c option without any insurance and that she MUST continue to talk with family and friends about need for housing assistance. She reports that she spoke with family this weekend and that no one is able/ willing to assist her with housing.   IF pt is able to reach mod independent goals, then her only option may be the homeless shelter, however, I reach out to area agencies about pt's situation to see if any other alternative.  Marguerite Jarboe, LCSW

## 2016-12-04 NOTE — Progress Notes (Signed)
[facility] [facility] PHYSICAL MEDICINE & REHABILITATION     PROGRESS NOTE    Subjective/Complaints: Pt seen sitting at EOB this AM.  She slept well overnight and states she had an uneventful weekend.  Review of Systems  Respiratory: Negative for shortness of breath.   Cardiovascular: Negative for chest pain.  Gastrointestinal: Negative for diarrhea, nausea and vomiting.  Neurological: Positive for sensory change and focal weakness.  Psychiatric/Behavioral: Negative for depression and suicidal ideas.    Objective: Vital Signs: Blood pressure 138/66, pulse 74, temperature 98.1 F (36.7 C), temperature source Oral, resp. rate 18, height 5\' 4"  (1.626 m), weight 76.2 kg (168 lb 1.4 oz), SpO2 97 %. No results found.  Recent Labs  12/04/16 0922  WBC 8.7  HGB 10.4*  HCT 32.8*  PLT 229    Recent Labs  12/02/16 1229  GLUCOSE 191*   CBG (last 3)   Recent Labs  12/03/16 1639 12/03/16 2127 12/04/16 0622  GLUCAP 198* 301* 247*    Wt Readings from Last 3 Encounters:  11/29/16 76.2 kg (168 lb 1.4 oz)  11/18/16 76.2 kg (167 lb 15.9 oz)  11/07/16 68.4 kg (150 lb 12.8 oz)    Physical Exam:  Constitutional: She appears well-developed and well-nourished.  HENT: Normocephalic and atraumatic.  Eyes: EOM are normal. No discharge.  Cardiovascular: RRR. No JVD       Respiratory: CTA Bilaterally. Normal effort   GI: BS +, Non-distended  Musculoskeletal: No tenderness, no edema in extremities.  Neurological: She is alert and oriented.  Motor: B/l UE 5/5 proximal to distal RLE: HF 3+/5, KE 3+/5, ADF/PF 3+/5 (slightly improved) LLE: HF, KE 3+/5, ADF/PF 4/5 (stronger than RLE) Skin: Skin is warm and dry.  Psychiatric: pleasant  Assessment/Plan: 1. Functional and neurological deficits secondary to multiple sclerosis which require 3+ hours per day of interdisciplinary therapy in a comprehensive inpatient rehab setting. Physiatrist is providing close team supervision and 24 hour management  of active medical problems listed below. Physiatrist and rehab team continue to assess barriers to discharge/monitor patient progress toward functional and medical goals.  Function:  Bathing Bathing position   Position: Shower  Bathing parts Body parts bathed by patient: Right arm, Left arm, Chest, Abdomen, Front perineal area, Buttocks, Right upper leg, Left upper leg, Right lower leg, Left lower leg Body parts bathed by helper: Back  Bathing assist Assist Level: Supervision or verbal cues      Upper Body Dressing/Undressing Upper body dressing   What is the patient wearing?: Pull over shirt/dress Bra - Perfomed by patient: Thread/unthread right bra strap, Thread/unthread left bra strap, Hook/unhook bra (pull down sports bra)   Pull over shirt/dress - Perfomed by patient: Thread/unthread right sleeve, Thread/unthread left sleeve, Put head through opening, Pull shirt over trunk          Upper body assist Assist Level: Set up   Set up : To obtain clothing/put away  Lower Body Dressing/Undressing Lower body dressing   What is the patient wearing?: Ted Hose, Pants, Non-skid slipper socks   Underwear - Performed by helper: Thread/unthread right underwear leg, Thread/unthread left underwear leg Pants- Performed by patient: Thread/unthread right pants leg, Thread/unthread left pants leg, Pull pants up/down Pants- Performed by helper: Thread/unthread right pants leg, Thread/unthread left pants leg, Pull pants up/down Non-skid slipper socks- Performed by patient: Don/doff right sock, Don/doff left sock Non-skid slipper socks- Performed by helper: Don/doff right sock, Don/doff left sock     Shoes - Performed by patient: Don/doff right  shoe, Don/doff left shoe Shoes - Performed by helper: Don/doff right shoe     TED Hose - Performed by patient: Don/doff right TED hose, Don/doff left TED hose TED Hose - Performed by helper: Don/doff right TED hose, Don/doff left TED hose  Lower body  assist Assist for lower body dressing:  (Total assist, onset of low blood sugar during session)      Toileting Toileting   Toileting steps completed by patient: Performs perineal hygiene Toileting steps completed by helper: Adjust clothing prior to toileting, Adjust clothing after toileting Toileting Assistive Devices: Grab bar or rail  Toileting assist Assist level: Touching or steadying assistance (Pt.75%)   Transfers Chair/bed transfer   Chair/bed transfer method: Stand pivot Chair/bed transfer assist level: Maximal assist (Pt 25 - 49%/lift and lower) Chair/bed transfer assistive device: Armrests     Locomotion Ambulation     Max distance: 25 Assist level: Touching or steadying assistance (Pt > 75%)   Wheelchair   Type: Manual Max wheelchair distance: 55' Assist Level: Supervision or verbal cues  Cognition Comprehension Comprehension assist level: Follows complex conversation/direction with extra time/assistive device  Expression Expression assist level: Expresses complex ideas: With extra time/assistive device  Social Interaction Social Interaction assist level: Interacts appropriately with others with medication or extra time (anti-anxiety, antidepressant).  Problem Solving Problem solving assist level: Solves basic problems with no assist  Memory Memory assist level: More than reasonable amount of time   Medical Problem List and Plan: 1.  Decreased functional mobility with bilateral lower extremity weakness secondary to active demyelinating disease/multiple sclerosis.  Cont CIR  Weekend notes reviewed 2.  DVT Prophylaxis/Anticoagulation: subcutaneous heparin. Monitor platelet counts any signs of bleeding 3. Pain Management/fibromyalgia: Neurontin 600 mg 3 times a day, Voltaren gel, oxycodone as needed for dysesthetic pain  analgesics as above 4. Mood: provide emotional support 5. Neuropsych: This patient is capable of making decisions on her own behalf. 6. Skin/Wound  Care: Routine skin checks 7. Fluids/Electrolytes/Nutrition:    -encourage fluids---if further elevation will need IVF 8.Hypertension.  Norvasc 10 mg daily  Lisinopril changed to 20mg  BID on 10/2  Increased toprol to 75mg  daily on 9/29  Hydralazine 10 TID PRN started 10/2, increased to 25 on 10/3  Appears to be improving 10/8 9.Diabetes mellitus of peripheral neuropathy.Latest hemoglobin A1c of 14.    -NovoLog 6 units 3 times a day..   -lantus 35 AM  -Lantus 5 U qhs started 10/8  Elevated 10/8 10.Hyperlipidemia. Lipitor 11.Urinary retention. Removed foley  -voiding trial  -cath prn  Flomax started on 10/2, increased on 10/5  PVRs with Volumes ~1400ccs  Bethanechol 10 TID started 10/3, increased to 25 on 10/4, increased to 50 on 10/8  PVRs overall improving 12.Medical noncompliance. Provide counseling 13. AKI  Cr pending on 10/8  Cont to monitor 14. Sleep disturbance  PRN Trazodone started 10/1 15. Cognition  Appreciate SLP eval 16. Leukocytosis: Resolved  Afebrile  WBCs 8.7 on 10/8 17. ABLA  Hb 10.4 on 10/8  Cont to monitor  LOS (Days) 12 A FACE TO FACE EVALUATION WAS PERFORMED  Aaiden Depoy Karis Juba, MD 12/04/2016 10:27 AM

## 2016-12-04 NOTE — Progress Notes (Signed)
Patients blood sugar was 277mg /dl, but she has been having multiple episodes with hypoglycemia since the weekend. Last night, patients blood sugar was 301, but there was no sliding scale. She did have a HS snack. On call provider was called & a sliding scale was added for the night coverage. This morning her blood sugars were in the 200s. A note was left & communication given to the oncoming nurse to check orders due to her having multiple orders. Today her blood sugars dropped again & a consult was put in for a consultation with the diabetes coordinator. Tonight, her blood sugars were 277 at hs. Hs snack had not been sent from dietary & they were contacted. Spoke to the dietician, who dietary states has to put in the order. Dietician Judeth Cornfield stated that she would put in an order in the morning & was instructed to find something on the unit. She was given a hs snack, but she had new insulin orders put in today that were concerning. On call provider was called & informed of todays hypoglycemic episode & her new orders for lantus 5 units at hs, sliding scale insulin at hs and lantus 20 units at 8am plus mealtime coverage. Provider gave verbal order to hold the sliding scale coverage tonight due to feared hypoglycemic episode. Patient was informed. Will continue to monitor.

## 2016-12-04 NOTE — Progress Notes (Signed)
Physical Therapy Session Note  Patient Details  Name: Debra [name] MRN: [id number] Date of Birth: [birth year]    Skilled Therapeutic Interventions/Progress Updates:    Pt missed 60 minutes skilled PT due to just being seen by rapid response team due to blood glucose of 33.  Therapy Documentation Precautions:  Precautions Precautions: Fall Restrictions Weight Bearing Restrictions: No General: PT Amount of Missed Time (min): 60 Minutes PT Missed Treatment Reason: Patient ill (Comment)   See Function Navigator for Current Functional Status.    [name],[name] 12/04/2016, 2:19 PM

## 2016-12-05 ENCOUNTER — Inpatient Hospital Stay (HOSPITAL_COMMUNITY): Payer: Self-pay | Admitting: Occupational Therapy

## 2016-12-05 ENCOUNTER — Inpatient Hospital Stay (HOSPITAL_COMMUNITY): Payer: Self-pay | Admitting: Speech Pathology

## 2016-12-05 ENCOUNTER — Encounter (HOSPITAL_COMMUNITY): Payer: Self-pay | Admitting: Psychology

## 2016-12-05 ENCOUNTER — Inpatient Hospital Stay (HOSPITAL_COMMUNITY): Payer: Self-pay | Admitting: Physical Therapy

## 2016-12-05 DIAGNOSIS — R0989 Other specified symptoms and signs involving the circulatory and respiratory systems: Secondary | ICD-10-CM

## 2016-12-05 LAB — GLUCOSE, CAPILLARY
GLUCOSE-CAPILLARY: 33 mg/dL — AB (ref 65–99)
GLUCOSE-CAPILLARY: 321 mg/dL — AB (ref 65–99)
GLUCOSE-CAPILLARY: 317 mg/dL — AB (ref 65–99)
Glucose-Capillary: 217 mg/dL — ABNORMAL HIGH (ref 65–99)
Glucose-Capillary: 340 mg/dL — ABNORMAL HIGH (ref 65–99)

## 2016-12-05 MED ORDER — INSULIN GLARGINE 100 UNIT/ML ~~LOC~~ SOLN: 15.0000 [IU] | Freq: Every day | SUBCUTANEOUS | Status: DC

## 2016-12-05 MED ORDER — INSULIN GLARGINE 100 UNIT/ML ~~LOC~~ SOLN
15.0000 [IU] | Freq: Every day | SUBCUTANEOUS | Status: DC
  Administered 2016-12-05 – 2016-12-13 (×9): 15 [IU] via SUBCUTANEOUS
  Filled 2016-12-05 (×10): qty 0.15

## 2016-12-05 MED ORDER — HYDRALAZINE HCL 10 MG PO TABS: 10.0000 mg | ORAL_TABLET | Freq: Three times a day (TID) | ORAL | Status: DC | PRN

## 2016-12-05 NOTE — Progress Notes (Signed)
Nutrition Follow-up  DOCUMENTATION CODES:   Not applicable  INTERVENTION:  Continue Boost Breeze po once daily, each supplement provides 250 kcal and 9 grams of protein.  Provide AM and HS snack (Ordered).  NUTRITION DIAGNOSIS:   Inadequate oral intake related to nausea, poor appetite as evidenced by per patient/family report; improved  GOAL:   Patient will meet greater than or equal to 90% of their needs; met  MONITOR:   PO intake, Supplement acceptance, Weight trends, Labs, I & O's  REASON FOR ASSESSMENT:   Malnutrition Screening Tool    ASSESSMENT:   Pt with PMH of type I DM with neuropathy, HLD, HTN, GERD, DKA presents with thoracic root lesion  Meal completion has been 100%. Pt reports appetite has been good. RD contacted via RN regarding need for nourishment snacks as pt with hypoglycemic events. RD to order. Pt preferences given. Pt currently has Boost Breeze ordered and has been consuming them. RD to continue with current orders. Labs and medications reviewed.  Diet Order:  Diet Carb Modified Fluid consistency: Thin; Room service appropriate? Yes  Skin:   (non pressure wound on buttocks)  Last BM:  10/8  Height:   Ht Readings from Last 1 Encounters:  11/22/16 '5\' 4"'$  (1.626 m)    Weight:   Wt Readings from Last 1 Encounters:  11/29/16 168 lb 1.4 oz (76.2 kg)    Ideal Body Weight:  54.5 kg  BMI:  Body mass index is 28.85 kg/m.  Estimated Nutritional Needs:   Kcal:  1800-2000  Protein:  90-115 grams  Fluid:  >/= 1.8 L/d  EDUCATION NEEDS:   Education needs no appropriate at this time  Corrin Parker, MS, RD, LDN Pager # [phone] After hours/ weekend pager # [phone]

## 2016-12-05 NOTE — Progress Notes (Signed)
[facility] PHYSICAL MEDICINE & REHABILITATION     PROGRESS NOTE    Subjective/Complaints: Pt seen sitting up in bed this AM.  She slept well overnight.  She notes symptomatic hypoglycemia yesterday.    Review of Systems  Respiratory: Negative for shortness of breath.   Cardiovascular: Negative for chest pain.  Gastrointestinal: Negative for diarrhea, nausea and vomiting.  Neurological: Positive for sensory change and focal weakness.  Psychiatric/Behavioral: Negative for depression and suicidal ideas.    Objective: Vital Signs: Blood pressure (!) 188/84, pulse 68, temperature 98.2 F (36.8 C), temperature source Oral, resp. rate 18, height 5\' 4"  (1.626 m), weight 76.2 kg (168 lb 1.4 oz), SpO2 100 %. No results found.  Recent Labs  12/04/16 0922  WBC 8.7  HGB 10.4*  HCT 32.8*  PLT 229    Recent Labs  12/02/16 1229 12/04/16 0922  NA  --  135  K  --  4.9  CL  --  99*  GLUCOSE 191* 254*  BUN  --  30*  CREATININE  --  1.54*  CALCIUM  --  8.7*   CBG (last 3)   Recent Labs  12/04/16 1655 12/04/16 2049 12/05/16 0624  GLUCAP 287* 277* 217*    Wt Readings from Last 3 Encounters:  11/29/16 76.2 kg (168 lb 1.4 oz)  11/18/16 76.2 kg (167 lb 15.9 oz)  11/07/16 68.4 kg (150 lb 12.8 oz)    Physical Exam:  Constitutional: She appears well-developed and well-nourished.  HENT: Normocephalic and atraumatic.  Eyes: EOM are normal. No discharge.  Cardiovascular: RRR. No JVD       Respiratory: CTA Bilaterally. Normal effort   GI: BS +, Non-distended  Musculoskeletal: No tenderness, no edema in extremities.  Neurological: She is alert and oriented.  Motor: B/l UE 5/5 proximal to distal RLE: HF 3+/5, KE 3+/5, ADF/PF 3+/5 (stable) LLE: HF, KE 3+/5, ADF/PF 4/5 (stronger than RLE) Skin: Skin is warm and dry.  Psychiatric: pleasant  Assessment/Plan: 1. Functional and neurological deficits secondary to multiple sclerosis which require 3+ hours per day of interdisciplinary  therapy in a comprehensive inpatient rehab setting. Physiatrist is providing close team supervision and 24 hour management of active medical problems listed below. Physiatrist and rehab team continue to assess barriers to discharge/monitor patient progress toward functional and medical goals.  Function:  Bathing Bathing position   Position: Shower  Bathing parts Body parts bathed by patient: Right arm, Left arm, Chest, Abdomen, Front perineal area, Buttocks, Right upper leg, Left upper leg, Right lower leg, Left lower leg Body parts bathed by helper: Back  Bathing assist Assist Level: Supervision or verbal cues      Upper Body Dressing/Undressing Upper body dressing   What is the patient wearing?: Pull over shirt/dress Bra - Perfomed by patient: Thread/unthread right bra strap, Thread/unthread left bra strap, Hook/unhook bra (pull down sports bra)   Pull over shirt/dress - Perfomed by patient: Thread/unthread right sleeve, Thread/unthread left sleeve, Put head through opening, Pull shirt over trunk          Upper body assist Assist Level: Set up   Set up : To obtain clothing/put away  Lower Body Dressing/Undressing Lower body dressing   What is the patient wearing?: Ted Hose, Pants, Non-skid slipper socks   Underwear - Performed by helper: Thread/unthread right underwear leg, Thread/unthread left underwear leg Pants- Performed by patient: Thread/unthread right pants leg, Thread/unthread left pants leg, Pull pants up/down Pants- Performed by helper: Thread/unthread right pants leg, Thread/unthread  left pants leg, Pull pants up/down Non-skid slipper socks- Performed by patient: Don/doff right sock, Don/doff left sock Non-skid slipper socks- Performed by helper: Don/doff right sock, Don/doff left sock     Shoes - Performed by patient: Don/doff right shoe, Don/doff left shoe Shoes - Performed by helper: Don/doff right shoe     TED Hose - Performed by patient: Don/doff right TED  hose, Don/doff left TED hose TED Hose - Performed by helper: Don/doff right TED hose, Don/doff left TED hose  Lower body assist Assist for lower body dressing:  (Total assist, onset of low blood sugar during session)      Toileting Toileting   Toileting steps completed by patient: Performs perineal hygiene Toileting steps completed by helper: Adjust clothing prior to toileting, Adjust clothing after toileting Toileting Assistive Devices: Grab bar or rail  Toileting assist Assist level: Touching or steadying assistance (Pt.75%)   Transfers Chair/bed transfer   Chair/bed transfer method: Stand pivot Chair/bed transfer assist level: Maximal assist (Pt 25 - 49%/lift and lower) Chair/bed transfer assistive device: Armrests     Locomotion Ambulation     Max distance: 25 Assist level: Touching or steadying assistance (Pt > 75%)   Wheelchair   Type: Manual Max wheelchair distance: 81' Assist Level: Supervision or verbal cues  Cognition Comprehension Comprehension assist level: Follows complex conversation/direction with extra time/assistive device  Expression Expression assist level: Expresses complex ideas: With extra time/assistive device  Social Interaction Social Interaction assist level: Interacts appropriately with others with medication or extra time (anti-anxiety, antidepressant).  Problem Solving Problem solving assist level: Solves basic problems with no assist  Memory Memory assist level: More than reasonable amount of time   Medical Problem List and Plan: 1.  Decreased functional mobility with bilateral lower extremity weakness secondary to active demyelinating disease/multiple sclerosis.  Cont CIR 2.  DVT Prophylaxis/Anticoagulation: subcutaneous heparin. Monitor platelet counts any signs of bleeding 3. Pain Management/fibromyalgia: Neurontin 600 mg 3 times a day, Voltaren gel, oxycodone as needed for dysesthetic pain 4. Mood: provide emotional support 5. Neuropsych:  This patient is capable of making decisions on her own behalf. 6. Skin/Wound Care: Routine skin checks 7. Fluids/Electrolytes/Nutrition:   8.Hypertension.  Norvasc 10 mg daily  Lisinopril changed to 20mg  BID on 10/2  Increased toprol to 75mg  daily on 9/29  Hydralazine 10 TID PRN started 10/2 Hypertensive crisis this AM, otherwise controlled, will cont to monitor 9.Diabetes mellitus of peripheral neuropathy.Latest hemoglobin A1c of 14.    -NovoLog 6 units 3 times a day, decreased to 3U.   -lantus decreased to 15 units AM  -Lantus 5 U qhs started 10/8  Symptomatic hypoglycemia of 22 yesterday with patient unresponsive  SSI adjusted  Diebetic coordinator consulted 10.Hyperlipidemia. Lipitor 11.Urinary retention. Removed foley  -voiding trial  -cath prn  Flomax started on 10/2, increased on 10/5  PVRs with Volumes ~1400ccs  Bethanechol 10 TID started 10/3, increased to 25 on 10/4, increased to 50 on 10/8  PVRs overall improving 12.Medical noncompliance. Provide counseling 13. AKI  Cr pending on 10/8  Cont to monitor 14. Sleep disturbance  PRN Trazodone started 10/1 15. Cognition  Appreciate SLP eval 16. Leukocytosis: Resolved  Afebrile  WBCs 8.7 on 10/8 17. ABLA  Hb 10.4 on 10/8  Cont to monitor  LOS (Days) 13 A FACE TO FACE EVALUATION WAS PERFORMED  Antoinette Haskett Karis Juba, MD 12/05/2016 8:23 AM

## 2016-12-05 NOTE — Progress Notes (Signed)
Inpatient Diabetes Program Recommendations  AACE/ADA: New Consensus Statement on Inpatient Glycemic Control (2015)  Target Ranges:  Prepandial:   less than 140 mg/dL      Peak postprandial:   less than 180 mg/dL (1-2 hours)      Critically ill patients:  140 - 180 mg/dL   Lab Results  Component Value Date   GLUCAP 340 (H) 12/05/2016   HGBA1C >14.0 10/31/2016    Review of Glycemic Control Results for Debra [name], Debra [name] (MRN [id number]) as of 12/05/2016 14:56  Ref. Range 12/04/2016 11:14 12/04/2016 12:47 12/04/2016 12:58 12/04/2016 13:06 12/04/2016 13:22 12/04/2016 13:34 12/04/2016 13:54 12/04/2016 16:55 12/04/2016 20:49 12/05/2016 06:24 12/05/2016 11:41  Glucose-Capillary Latest Ref Range: 65 - 99 mg/dL 096 (H) 43 (LL) 33 (LL) 38 (LL) 149 (H) 109 (H) 81 287 (H) 277 (H) 217 (H) 340 (H)   Diabetes history: Type 1 Outpatient Diabetes medications: Lantus 33 units q HS, Novolog 10 units tid with meals Current orders for Inpatient glycemic control:  Lantus 15 units daily and Lantus 5 units q PM, Novolog 3 units tid with meals, Novolog 0-8 units tid with meals and 0-5 units q HS  Inpatient Diabetes Program Recommendations:   Note hypoglycemic events. Insulin needs seemed to have decreased since steroids stopped.  Agree with current orders in attempt to avoid hypoglycemia.  May need to add correction back at 151-200 mg/dL- 1 unit?  Will follow.  Thanks Beryl Meager, RN, BC-ADM Inpatient Diabetes Coordinator Pager [phone] (8a-5p)

## 2016-12-05 NOTE — Progress Notes (Signed)
Speech Language Pathology Discharge Summary  Patient Details  Name: Debra [name] MRN: [id number] Date of Birth: [birth year]  Today's Date: 12/05/2016 SLP Individual Time: 0850-0930 SLP Individual Time Calculation (min): 40 min   Skilled Therapeutic Interventions:  Pt was seen for skilled ST targeting cognitive goals.  Pt counted money and made change for 100% accuracy with mod I.  She reports that she only used cash at baseline and had never used a debit card or check book to manage her finances.  Pt was also 100% accurate with mod I for completing functional mental math calculations.  Pt reports feeling that she is back to her baseline for cognitive-linguistic function.  SLP discussed memory compensatory strategies to prevent future decompensation.  Handout was provided to maximize carryover after discharge.  All questions were answered to pt's satisfaction at this time.  No further ST needs indicated.      Patient has met 1 of 1 long term goals.  Patient to discharge at overall Modified Independent level.  Reasons goals not met:     Clinical Impression/Discharge Summary:  Pt has made functional gains while inpatient and is discharging from [facility] services having met 1 out of 1 long term goals.  Pt is mod I for complex cognitive tasks and reports that she is back to baseline for mentation.  Pt education is complete.  No further ST needs are indicated at this time.     Care Partner:  Caregiver Able to Provide Assistance:  (n/a)  Type of Caregiver Assistance:  (n/a)  Recommendation:  None      Equipment: none recommended by SLP    Reasons for discharge: Treatment goals met   Patient/Family Agrees with Progress Made and Goals Achieved: Yes   Function:  Eating Eating                 Cognition Comprehension Comprehension assist level: Follows complex conversation/direction with extra time/assistive device  Expression   Expression assist level: Expresses complex ideas: With extra  time/assistive device  Social Interaction Social Interaction assist level: Interacts appropriately with others with medication or extra time (anti-anxiety, antidepressant).  Problem Solving Problem solving assist level: Solves complex problems: With extra time  Memory Memory assist level: More than reasonable amount of time   Windell Moulding L 12/05/2016, 9:52 AM

## 2016-12-05 NOTE — Progress Notes (Signed)
Occupational Therapy Session Note  Patient Details  Name: Debra [name] MRN: [id number] Date of Birth: 09/23/1965  Today's Date: 12/05/2016 OT Individual Time: 0947-0962 OT Individual Time Calculation (min): 43 min    Short Term Goals: Week 2:  OT Short Term Goal 1 (Week 2): STG = LTGs  Skilled Therapeutic Interventions/Progress Updates:    Treatment focus on dynamic standing balance/tolerance during functional tasks to promote improvement in ADLs/IADLs. Pt received in bed reporting to be cold but willing to get up and participate. Therapist noting improvement in pt mood this session. Ambulated ~100' to ADL apartment with RW and min guard from therapist with no rest breaks. Engaged in simulated cooking task to incorporate pt interest to encourage pt participation and motivation. Homemaking task completed in apartment kitchen to facilitate reaching, crossing midline, weight shifting, and bending down to retrieve items from cabinets using RW or counter tops for UE support and therapist providing min guard throughout task and min VC to use walker bag for transporting items. Therapist challenged pt to put clothes away in dresser requiring pt to bend down to advance dynamic standing balance and BLE strength with therapist providing close supervision. Pt unable to reach bottom of dresser due to decreased standing balance and BLE strength. Therapist providing pt education of alternative options to address current problem with pt verbalizing understanding. Pt attempted to ambulate back to room with RW; however demonstrated LOB x1 with ability to correct using RW. Pt requesting to sit in w/c and returned to room with all needs in reach.  Therapy Documentation Precautions:  Precautions Precautions: Fall Restrictions Weight Bearing Restrictions: No General:   Vital Signs: Therapy Vitals Temp: 98.1 F (36.7 C) Temp Source: Oral Pulse Rate: 79 Resp: 18 BP: (!) 169/79 Patient Position (if  appropriate): Lying Oxygen Therapy SpO2: 96 % O2 Device: Not Delivered Pain:  No c/o pain at beginning of session; however, pt reporting pain at end of session rating 5/10 in BLE due to standing/walking majority of session.   See Function Navigator for Current Functional Status.   Therapy/Group: Individual Therapy  [name] 12/05/2016, 3:13 PM

## 2016-12-05 NOTE — Consult Note (Signed)
Neuropsychological Consultation   Patient:   Debra [name]   DOB:   12/19/1965  MR Number:  [id number]  Location:  [facility] [id number] [place] [address] Dept: [phone] ZDG: [phone]           Date of Service:   11/28/2016  Start Time:   3 PM End Time:   4:00 PM  Provider/Observer:  Ilean Skill, Psy.D.       Clinical Neuropsychologist       Billing Code/Service: [phone] 4 Units  Chief Complaint:    Debra [name] is a 51 year old female who has a history of cardiovascular event that occurred in 1990. She has changes in gait as well as not always compliant with medical follow-up. The patient is a diagnosis of type 1 diabetes as well as neuropathy. The patient presented on 11/15/2016 with progressive weakness in her lower extremities as well as back pain lasting the prior 2 months. She had been having increasing difficulties walking and associated gastrointestinal issues.  MRI of her cervical spine conducted on 11/21/2016 showed patchy cord signal abnormalities extending from the craniocervical junction through the upper thoracic spine region. These findings were consistent with a demyelinating disease. MRI of the brain showed multifocal cerebral white matter changes in patterns most consistent with demyelinating disease (multiple sclerosis). The patient has been referred for comprehensive inpatient rehabilitation program. The patient has a history of depression anxiety in the past and not following through with medication recommendations and medical recommendations. The patient is described as being very anxious at times and worrying and wondering what is been going on with her medically and what her future prognosis is.  Reason for Service:  This is a follow-up appointment with patient to continue with coping and adaptive issues due to MS with significant neurological involvement.  Debra [name] was referred for neuropsychological consultation to work on coping skills and strategies around dealing with her recent diagnosis of possible multiple sclerosis as well as coping with the residual neurological symptoms that have developed. Below is the full history of present illness for the current admission.  HPI: Debra [name] a 51 y.o.right handed femalewith history of CVA 1990 as well as gait dysfunction and patient was noncompliant with medical follow-up, type 1 diabetes mellitus with neuropathy, DKA, fibromyalgia. Per chart review and patient, patient lives with her boyfriend. Boyfriend works during the day and no other local family can assist. One level home with 1 step to entry. She was independent until approximately 3 weeks ago. Presented 11/15/2016 with progressive weakness bilateral lower extremities as well as back pain over the past 2 months and difficulty with ambulation as well as associated diarrhea. Noted sodium 127, creatinine 2.72.UDS positive opiates.troponin 0.47 felt to be secondary to demand ischemia.CT abdomen and chest x-ray unremarkable. Glucose was 998 she was started on insulin drip. MRI with thoracic lesion. Per reports, MRI lumbar and thoracic showed no canal stenosis or neural foraminal narrowing however there was some paraspinal muscle edema.Thoracic MRI showed a 3 x 13 mm intramedullary nonenhancing lesionT10 level.serum CK was normal. Neurology did not recommend any further workup in regards to reported suspect lesion as there was no mass effect and cauda equina syndrome was ruled out. ESR and CK were normal.MRI 09/25/2018of the brain completed showing multifocal cerebral white matter changes in a distribution most consistent with demyelinating disease, multiple sclerosis. No other acute intracranial abnormality.MRI cervical spine 11/21/2016  showed patchy cord signal abnormality extending from the craniocervical junction through the upper thoracic spine again  consistent with demyelinating disease. Evidence for active demyelination at the level of C1-2 and C6.neurology was contacted in regards to latest MRI findings and placed on IV Solu-Medrol for anticipation of 5 days.Initial plan for lumbar puncture but later discontinued after her MRI findings. Bouts of urinary retention with bladder scan 999on 11/22/2016. Subcutaneous heparin for DVT prophylaxis. Physical occupational therapy evaluations completed. Recommendations were later made for physical medicine rehabilitation consult.Patient was admitted for a comprehensive rehabilitation program  Current Status:  The patient continues to report anxiety and while her depression is worse and she does have a history of depression and anxiety. The patient reports that anxiety and depression continue but are improved.  The patient reports that she has been worried and confused about what is actually happening and we did spend a significant amount of time going over the working diagnosis and how that may impact her not only acutely but also long-term issues.  Worked on issues of not seeing current symptoms as static going forward.  Behavioral Observation: Debra [name]  presents as a 51 y.o.-year-old Right African American Female who appeared her stated age. her dress was Appropriate and she was Well Groomed and her manners were Appropriate to the situation.  her participation was indicative of Appropriate and Attentive behaviors.  There were physical disabilities noted.  she displayed an appropriate level of cooperation and motivation.     Interactions:    Active Appropriate and Attentive  Attention:   within normal limits and attention span and concentration were age appropriate  Memory:   within normal limits; recent and remote memory intact  Visuo-spatial:  within normal limits  Speech (Volume):  low  Speech:   normal;   Thought Process:  Coherent and Relevant  Though Content:  WNL; not  suicidal  Orientation:   person, place, time/date and situation  Judgment:   Good  Planning:   Fair  Affect:    Anxious  Mood:    Anxious  Insight:   Fair  Intelligence:   normal  Medical History:   Past Medical History:  Diagnosis Date  . Adenomatous colonic polyps   . Anemia    2005  . Anxiety    1990  . Arthritis   . Asthma    2000  . Cataract   . Depression 1990  . Difficult intubation    narrow airway  . Gastroesophageal Reflux Disease (GERD)   . Heart murmur    Birth  . Hyperlipidemia    2005  . Hypertension    1998  . Internal hemorrhoids 04/24/06   on colonoscopy  . Migraine   . Neuropathy of the hands & feet   . Restless Leg Syndrome   . Right Ankle Fracture 10/06/2013  . Sleep paralysis   . Stable angina pectoris    2007: cath showing normal cors.   . Stroke 1990  . Type I Diabetes Mellitus 1988   Family Med/Psych History:  Family History  Problem Relation Age of Onset  . Hypertension Mother   . Kidney disease Mother   . Hypertension Father   . Breast cancer Maternal Grandmother   . Prostate cancer Maternal Grandfather   . Ovarian cancer Paternal Grandmother   . Prostate cancer Paternal Grandfather   . Colon cancer Maternal Uncle        Family history of malignant neoplasm of gastrointestinal tract    Risk  of Suicide/Violence: virtually non-existent the patient denies any suicidal or homicidal ideation.  Impression/DX:  Debra [name] is a 51 year old female who has a history of cardiovascular event that occurred in 1990. She has changes in gait as well as not always compliant with medical follow-up. The patient is a diagnosis of type 1 diabetes as well as neuropathy. The patient presented on 11/15/2016 with progressive weakness in her lower extremities as well as back pain lasting the prior 2 months. She had been having increasing difficulties walking and associated gastrointestinal issues.  MRI of her cervical spine conducted on 11/21/2016  showed patchy cord signal abnormalities extending from the craniocervical junction through the upper thoracic spine region. These findings were consistent with a demyelinating disease. MRI of the brain showed multifocal cerebral white matter changes in patterns most consistent with demyelinating disease (multiple sclerosis). The patient has been referred for comprehensive inpatient rehabilitation program. The patient has a history of depression anxiety in the past and not following through with medication recommendations and medical recommendations. The patient is described as being very anxious at times and worrying and wondering what is been going on with her medically and what her future prognosis is.   Disposition/Plan:  Continued working on coping and adaptive skills.         Electronically Signed   _______________________ Ilean Skill, Psy.D.

## 2016-12-05 NOTE — Progress Notes (Signed)
Physical Therapy Session Note  Patient Details  Name: Debra [name] MRN: [id number] Date of Birth: 1965-10-29  Today's Date: 12/05/2016 PT Individual Time: 0930-1026 PT Individual Time Calculation (min): 56 min    Skilled Therapeutic Interventions/Progress Updates:    pt c.o no pain and agreeable to therapy. Pt ambulated Min A with RW 61f. Performed dynamic balance with kicking ball alternating feet Min A with RW for arm support. Pt performed catching the ball Min A for balance and coordination. Pt performed dynavision 3 sets with a +/- 2 second response time. Then performed alternating attention task with numbers and buttons, with 75% completion.  Pt complained of fatigue in the legs so we tried to do the arm bike for cardio and UE strength. Pt performed 2 min, then complained of pain, clicking and catching with activity, pain stopped when activity was discontinued.  + Compression Rotation, + Biceps load 2, + Yergason. Completed ambulating 20 ft in a  figure 8 pattern x2 Min A with RW. Pt was left sitting EOB with call bell in hand and all needs met.    Therapy/Group: Individual Therapy  France Noyce 12/05/2016, 11:57 AM

## 2016-12-05 NOTE — Progress Notes (Signed)
Nutrition Brief Note  6 am and 8 pm nourishment snacks have been ordered.   6 am: graham crackers, peanut butter, chocolate pudding 8 pm: half sandwich, dole fruit cup  If snacks unavailable, contact tray line at #[phone] or provide unit nourishment.  Roslyn Smiling, MS, RD, LDN Pager # [phone] After hours/ weekend pager # [phone]

## 2016-12-05 NOTE — Progress Notes (Signed)
Occupational Therapy Session Note  Patient Details  Name: Debra [name] MRN: [id number] Date of Birth: [birth year]  Today's Date: 12/05/2016 OT Individual Time: 0037-0488 OT Individual Time Calculation (min): 45 min    Short Term Goals: Week 2:  OT Short Term Goal 1 (Week 2): STG = LTGs  Skilled Therapeutic Interventions/Progress Updates:    Treatment focus on basic ADLs and functional mobility to promote independence in daily tasks. Pt received sitting EOB with breakfast tray reporting feeling better this AM. Bathing EOB sit > stand level with set-up and close supervision from therapist for standing balance. Therapist noting improvement in LB bathing/dressing with pt crossing BLE and therapist providing less assistance at this time. Engaged in oral care standing at sink alternating UE support with close supervision for balance. Therapist challenged pt to complete laundry task (putting clothes away) using RW and walker bag to facilitate home routine and focus on functional mobility/home distances. Pt completing task with close supervision from therapist and LOB x1 demonstrating ability to correct balance with min A from therapist. Therapist providing pt education of safety when ambulating home distances. Pt returned sitting EOB with all needs in reach.  Therapy Documentation Precautions:  Precautions Precautions: Fall Restrictions Weight Bearing Restrictions: No  Pain: C/o pain 6/10 of BLE but willing to participate in therapy session  See Function Navigator for Current Functional Status.   Therapy/Group: Individual Therapy  [name] 12/05/2016, 12:07 PM

## 2016-12-05 NOTE — Addendum Note (Signed)
Addended by: Dorie Rank E on: 12/05/2016 04:40 PM   Modules accepted: Orders

## 2016-12-06 ENCOUNTER — Inpatient Hospital Stay (HOSPITAL_COMMUNITY): Payer: Self-pay

## 2016-12-06 ENCOUNTER — Inpatient Hospital Stay (HOSPITAL_COMMUNITY): Payer: Self-pay | Admitting: Occupational Therapy

## 2016-12-06 LAB — GLUCOSE, CAPILLARY
GLUCOSE-CAPILLARY: 297 mg/dL — AB (ref 65–99)
GLUCOSE-CAPILLARY: 125 mg/dL — AB (ref 65–99)
GLUCOSE-CAPILLARY: 243 mg/dL — AB (ref 65–99)
GLUCOSE-CAPILLARY: 252 mg/dL — AB (ref 65–99)
Glucose-Capillary: 261 mg/dL — ABNORMAL HIGH (ref 65–99)
Glucose-Capillary: 171 mg/dL — ABNORMAL HIGH (ref 65–99)

## 2016-12-06 LAB — URINALYSIS, ROUTINE W REFLEX MICROSCOPIC
Bilirubin Urine: NEGATIVE
Glucose, UA: >=500 mg/dL — AB
Hgb urine dipstick: NEGATIVE
Ketones, ur: NEGATIVE mg/dL
Nitrite: NEGATIVE
Protein, ur: 30 mg/dL — AB
Specific Gravity, Urine: 1.011 (ref 1.005–1.030)
pH: 5 (ref 5.0–8.0)

## 2016-12-06 MED ORDER — MUSCLE RUB 10-15 % EX CREA
TOPICAL_CREAM | CUTANEOUS | Status: DC | PRN
  Administered 2016-12-06: 12:00:00 via TOPICAL
  Administered 2016-12-09: 1 via TOPICAL
  Filled 2016-12-06: qty 85

## 2016-12-06 MED ORDER — INSULIN GLARGINE 100 UNIT/ML ~~LOC~~ SOLN
10.0000 [IU] | Freq: Every day | SUBCUTANEOUS | Status: DC
  Administered 2016-12-06 – 2016-12-09 (×4): 10 [IU] via SUBCUTANEOUS
  Filled 2016-12-06 (×6): qty 0.1

## 2016-12-06 MED ORDER — NITROFURANTOIN MONOHYD MACRO 100 MG PO CAPS
100.0000 mg | ORAL_CAPSULE | Freq: Two times a day (BID) | ORAL | Status: AC
  Administered 2016-12-06 – 2016-12-13 (×14): 100 mg via ORAL
  Filled 2016-12-06 (×14): qty 1

## 2016-12-06 MED ORDER — HYDRALAZINE HCL 10 MG PO TABS: 10.0000 mg | ORAL_TABLET | Freq: Three times a day (TID) | ORAL | Status: DC

## 2016-12-06 NOTE — Progress Notes (Signed)
Occupational Therapy Session Note  Patient Details  Name: Debra [name] MRN: [id number] Date of Birth: 1966-01-25  Today's Date: 12/06/2016 OT Individual Time: 8841-6606 OT Individual Time Calculation (min): 43 min    Short Term Goals: Week 2:  OT Short Term Goal 1 (Week 2): STG = LTGs  Skilled Therapeutic Interventions/Progress Updates:    Treatment focus on dynamic standing balance and functional mobility during functional tasks. Pt received EOB finishing lunch and agreeable to therapy. Pt reporting feeling "ok". Engaged in simulated grocery shopping task to challenge dynamic balance and functional mobility required for basic ADLs and IADLs/homemaking tasks. Pt able to use cart and pick up items from table level with steadying assist from therapist for pt safety. Therapist providing pt education and alternative options of community mobility and safety out in the community in preparation for d/c. Pt c/o dizziness and reported feeling "hot" and requested to sit. RN notified and vitals obtained. See vitals below. Pt returned to bed with min A from therapist and all needs in reach.  Therapy Documentation Precautions:  Precautions Precautions: Fall Restrictions Weight Bearing Restrictions: No General:   Vital Signs: Therapy Vitals Temp: 99 F (37.2 C) Temp Source: Oral Pulse Rate: 68 BP: 119/61 Patient Position (if appropriate): Sitting Oxygen Therapy SpO2: 99 % O2 Device: Not Delivered Pain:  C/o pain rating 6/10 but willing to participate in therapy  See Function Navigator for Current Functional Status.   Therapy/Group: Individual Therapy  [name] 12/06/2016, 3:12 PM

## 2016-12-06 NOTE — Plan of Care (Signed)
Problem: RH BLADDER ELIMINATION Goal: RH STG MANAGE BLADDER WITH EQUIPMENT WITH ASSISTANCE STG Manage Bladder With Equipment With mod Assistance Outcome: Not Progressing Total assistance from staff

## 2016-12-06 NOTE — Progress Notes (Signed)
[place] PHYSICAL MEDICINE & REHABILITATION     PROGRESS NOTE    Subjective/Complaints: Pt seen this AM sitting at EOB.  She slept well overnight.  She has some financial concerns.    Review of Systems  Respiratory: Negative for shortness of breath.   Cardiovascular: Negative for chest pain.  Gastrointestinal: Negative for diarrhea, nausea and vomiting.  Neurological: Positive for sensory change and focal weakness.  Psychiatric/Behavioral: Negative for depression and suicidal ideas.    Objective: Vital Signs: Blood pressure (!) 178/77, pulse 71, temperature 98.6 F (37 C), temperature source Oral, resp. rate 18, height 5\' 4"  (1.626 m), weight 76.2 kg (168 lb 0.8 oz), SpO2 98 %. No results found.  Recent Labs  12/04/16 0922  WBC 8.7  HGB 10.4*  HCT 32.8*  PLT 229    Recent Labs  12/04/16 0922  NA 135  K 4.9  CL 99*  GLUCOSE 254*  BUN 30*  CREATININE 1.54*  CALCIUM 8.7*   CBG (last 3)   Recent Labs  12/05/16 2114 12/06/16 0229 12/06/16 0625  GLUCAP 317* 297* 261*    Wt Readings from Last 3 Encounters:  12/06/16 76.2 kg (168 lb 0.8 oz)  11/18/16 76.2 kg (167 lb 15.9 oz)  11/07/16 68.4 kg (150 lb 12.8 oz)    Physical Exam:  Constitutional: She appears well-developed and well-nourished.  HENT: Normocephalic and atraumatic.  Eyes: EOM are normal. No discharge.  Cardiovascular: RRR. No JVD       Respiratory: CTA Bilaterally. Normal effort   GI: BS +, Non-distended  Musculoskeletal: No tenderness, no edema in extremities.  Neurological: She is alert and oriented.  Motor: B/l UE 5/5 proximal to distal RLE: HF 3+/5, KE 3+/5, ADF/PF 3+/5 (unchanged) LLE: HF, KE 3+/5, ADF/PF 4/5 (stronger than RLE) Skin: Skin is warm and dry.  Psychiatric: pleasant  Assessment/Plan: 1. Functional and neurological deficits secondary to multiple sclerosis which require 3+ hours per day of interdisciplinary therapy in a comprehensive inpatient rehab setting. Physiatrist  is providing close team supervision and 24 hour management of active medical problems listed below. Physiatrist and rehab team continue to assess barriers to discharge/monitor patient progress toward functional and medical goals.  Function:  Bathing Bathing position   Position: Sitting EOB  Bathing parts Body parts bathed by patient: Right arm, Left arm, Chest, Abdomen, Front perineal area, Buttocks, Right upper leg, Left upper leg, Right lower leg, Left lower leg Body parts bathed by helper: Back  Bathing assist Assist Level: Supervision or verbal cues      Upper Body Dressing/Undressing Upper body dressing   What is the patient wearing?: Pull over shirt/dress Bra - Perfomed by patient: Thread/unthread right bra strap, Thread/unthread left bra strap, Hook/unhook bra (pull down sports bra)   Pull over shirt/dress - Perfomed by patient: Thread/unthread right sleeve, Thread/unthread left sleeve, Put head through opening, Pull shirt over trunk          Upper body assist Assist Level: Set up   Set up : To obtain clothing/put away  Lower Body Dressing/Undressing Lower body dressing   What is the patient wearing?: Pants, [employer], Shoes   Underwear - Performed by helper: Thread/unthread right underwear leg, Thread/unthread left underwear leg Pants- Performed by patient: Thread/unthread right pants leg, Thread/unthread left pants leg, Pull pants up/down, Fasten/unfasten pants Pants- Performed by helper: Thread/unthread right pants leg, Thread/unthread left pants leg, Pull pants up/down Non-skid slipper socks- Performed by patient: Don/doff right sock, Don/doff left sock Non-skid slipper socks-  Performed by helper: Don/doff right sock, Don/doff left sock     Shoes - Performed by patient: Don/doff right shoe, Don/doff left shoe Shoes - Performed by helper: Don/doff right shoe     TED Hose - Performed by patient: Don/doff right TED hose, Don/doff left TED hose TED Hose - Performed by  helper: Don/doff right TED hose, Don/doff left TED hose  Lower body assist Assist for lower body dressing:  (close supervision)      Toileting Toileting   Toileting steps completed by patient: Performs perineal hygiene Toileting steps completed by helper: Adjust clothing prior to toileting, Adjust clothing after toileting Toileting Assistive Devices: Grab bar or rail  Toileting assist Assist level: Touching or steadying assistance (Pt.75%)   Transfers Chair/bed transfer   Chair/bed transfer method: Stand pivot Chair/bed transfer assist level: Moderate assist (Pt 50 - 74%/lift or lower) Chair/bed transfer assistive device: Armrests     Locomotion Ambulation     Max distance: 25 Assist level: Touching or steadying assistance (Pt > 75%)   Wheelchair   Type: Manual Max wheelchair distance: 1' Assist Level: Supervision or verbal cues  Cognition Comprehension Comprehension assist level: Follows complex conversation/direction with extra time/assistive device  Expression Expression assist level: Expresses complex ideas: With extra time/assistive device  Social Interaction Social Interaction assist level: Interacts appropriately with others with medication or extra time (anti-anxiety, antidepressant).  Problem Solving Problem solving assist level: Solves complex problems: With extra time  Memory Memory assist level: More than reasonable amount of time   Medical Problem List and Plan: 1.  Decreased functional mobility with bilateral lower extremity weakness secondary to active demyelinating disease/multiple sclerosis.  Cont CIR 2.  DVT Prophylaxis/Anticoagulation: subcutaneous heparin. Monitor platelet counts any signs of bleeding 3. Pain Management/fibromyalgia: Neurontin 600 mg 3 times a day, Voltaren gel, oxycodone as needed for dysesthetic pain 4. Mood: provide emotional support 5. Neuropsych: This patient is capable of making decisions on her own behalf. 6. Skin/Wound Care:  Routine skin checks 7. Fluids/Electrolytes/Nutrition:   8.Hypertension.  Norvasc 10 mg daily  Lisinopril changed to 20mg  BID on 10/2  Increased toprol to 75mg  daily on 9/29  Hydralazine 10 TID PRN started 10/2   Hydralazine 10 TID started 10/10  Uncontrolled at present 9.Diabetes mellitus of peripheral neuropathy.Latest hemoglobin A1c of 14.    -NovoLog 6 units 3 times a day, decreased to 3U.   -lantus decreased to 15 units AM  -Lantus 5 U qhs started 10/8, increased to 10 on 10/10  SSI adjusted  Diebetic coordinator consulted  Uncontrolled at present 10.Hyperlipidemia. Lipitor 11.Urinary retention. Removed foley  -voiding trial  -cath prn  Flomax started on 10/2, increased on 10/5  PVRs with Volumes ~1400ccs  Bethanechol 10 TID started 10/3, increased to 25 on 10/4, increased to 50 on 10/8  PVRs overall improving 12.Medical noncompliance. Provide counseling 13. AKI  Cr 1.54 on 10/8  Encourage fluids  May need to d/c ACE  Cont to monitor 14. Sleep disturbance  PRN Trazodone started 10/1 15. Cognition  Appreciate SLP eval 16. Leukocytosis: Resolved  Afebrile  WBCs 8.7 on 10/8 17. ABLA  Hb 10.4 on 10/8  Cont to monitor  LOS (Days) 14 A FACE TO FACE EVALUATION WAS PERFORMED  Ankit Karis Juba, MD 12/06/2016 8:44 AM

## 2016-12-06 NOTE — Progress Notes (Signed)
Occupational Therapy Session Note  Patient Details  Name: Debra [name] MRN: [id number] Date of Birth: Jul 04, 1965  Today's Date: 12/06/2016 OT Individual Time: 9373-4287 OT Individual Time Calculation (min): 47 min    Short Term Goals: Week 2:  OT Short Term Goal 1 (Week 2): STG = LTGs  Skilled Therapeutic Interventions/Progress Updates:    Treatment focus on basic self-care, sit > stand, and standing balance during functional tasks. Pt received in bed this session reporting she is cold but willing to complete ADLs sitting EOB. Engaged in bathing sit > stand level with therapist providing set-up for UB bathing/dressing and min guard-close supervision for dynamic standing balance during LB bathing/dressing with use of RW for UE support. Sit <> stand from EOB with RW and therapist providing close supervision. Therapist challenged pt to complete oral care standing at sink this session to facilitate routine prior to admission and standing balance required for basic self-care with pt completing task with close supervision. Pt ambulated to recliner with RW and close supervision with all needs in reach at end of session.  Therapy Documentation Precautions:  Precautions Precautions: Fall Restrictions Weight Bearing Restrictions: No General:   Vital Signs: Therapy Vitals Pulse Rate: 66 BP: 138/67 Patient Position (if appropriate): Lying Oxygen Therapy SpO2: 99 % O2 Device: Not Delivered Pain: C/o pain in shoulders 5/10 and hips 6/10 but willing to participate in therapy.  See Function Navigator for Current Functional Status.   Therapy/Group: Individual Therapy  [name] 12/06/2016, 12:13 PM

## 2016-12-06 NOTE — Progress Notes (Addendum)
Physical Therapy Note  Patient Details  Name: Debra [name] MRN: [id number] Date of Birth: November 08, 1965 Today's Date: 12/06/2016  tx 1:  1005-1050, 45 min individual tx Pain: pain 7/10 bil hip joints and bil thighs; premedicated.  PT consulted Charisse March, RN about pain.  stand pivot bed>< w/c with min guard assist; squat pivot with supervision.  NuStep at level 2 for activity tolerance; pt could only tolerate 2 minutes as pt stated it made her hips feel worse. W/c propulsion for activity tolerance, on carpet and level tile x 150' with supervision.  Gait x 60' through obstacle course of cones and canes on floor, with min guard assist, without any knee buckling.  Pt did not clear feet easily over canes due to inadequate hip flexion, knee flexion.  Pt left resting in bed with alarm set and all needs within reach.  tx 2:  1508-1603, 55 min individual tx Pain: 5/10 bil hip joints , premedicated  Pt asleep in bed, stated she didn't feel good, but will try to participate in bedside tx to start. Gentle PROM bil hips and grade I mobilizations to address joint pain.   Supine: 10 x 1 bil bridging, lower trunk rotation, active assistive straight leg raises, modified abdominal crunches, 2 x 10 alternating ankle pumps, bil hip internal rotation within pain-free range. Side lyiing: 10 x 1 bil hip flexion, R/L clam shells- hip abduction  Sustained stretch R/L obliques in supine. Gait with RW forward, backward, side stepping R/L with min guard, cues for management of RW.  Up/down (4) 6" high steps, bil rails, step to method, with min assist, min cues for sequencing and hand placement on rails.  Standing R/L hip abduction, and balance challenge standing on [place] mat with bil UE support fading to 0UE support; pt had LOB backwards x 2 in 30 seconds requiring min assist to regain. Pt exhausted.  Pt left resting in bed with alarm set and all needs within reach.  See function navigator for current  status.   [name] 12/06/2016, 7:51 AM

## 2016-12-07 ENCOUNTER — Inpatient Hospital Stay (HOSPITAL_COMMUNITY): Payer: Self-pay | Admitting: Occupational Therapy

## 2016-12-07 ENCOUNTER — Inpatient Hospital Stay (HOSPITAL_COMMUNITY): Payer: Self-pay | Admitting: Physical Therapy

## 2016-12-07 DIAGNOSIS — N319 Neuromuscular dysfunction of bladder, unspecified: Secondary | ICD-10-CM

## 2016-12-07 DIAGNOSIS — N390 Urinary tract infection, site not specified: Secondary | ICD-10-CM

## 2016-12-07 LAB — GLUCOSE, CAPILLARY
GLUCOSE-CAPILLARY: 263 mg/dL — AB (ref 65–99)
GLUCOSE-CAPILLARY: 179 mg/dL — AB (ref 65–99)
GLUCOSE-CAPILLARY: 312 mg/dL — AB (ref 65–99)
Glucose-Capillary: 174 mg/dL — ABNORMAL HIGH (ref 65–99)

## 2016-12-07 NOTE — Progress Notes (Signed)
[place] PHYSICAL MEDICINE & REHABILITATION     PROGRESS NOTE    Subjective/Complaints: Patient seen sitting up at the edge of the bed this morning. She states she slept well overnight. She has questions about her urinary tract infection.  Review of Systems  Respiratory: Negative for shortness of breath.   Cardiovascular: Negative for chest pain.  Gastrointestinal: Negative for diarrhea, nausea and vomiting.  Neurological: Positive for sensory change and focal weakness.  Psychiatric/Behavioral: Negative for depression and suicidal ideas.    Objective: Vital Signs: Blood pressure (!) 165/76, pulse 75, temperature 98.8 F (37.1 C), temperature source Oral, resp. rate 18, height  (1.626 m), weight 76.2 kg (168 lb 0.8 oz), SpO2 93 %. No results found. No results for input(s): WBC, HGB, HCT, PLT in the last 72 hours. No results for input(s): NA, K, CL, GLUCOSE, BUN, CREATININE, CALCIUM in the last 72 hours.  Invalid input(s): CO CBG (last 3)   Recent Labs  12/06/16 1648 12/06/16 2046 12/07/16 0719  GLUCAP 243* 252* 174*    Wt Readings from Last 3 Encounters:  12/06/16 76.2 kg (168 lb 0.8 oz)  11/18/16 76.2 kg (167 lb 15.9 oz)  11/07/16 68.4 kg (150 lb 12.8 oz)    Physical Exam:  Constitutional: She appears well-developed and well-nourished.  HENT: Normocephalic and atraumatic.  Eyes: EOM are normal. No discharge.  Cardiovascular: RRR. No JVD       Respiratory: CTA Bilaterally. Normal effort   GI: BS +, Non-distended  Musculoskeletal: No tenderness, no edema in extremities.  Neurological: She is alert and oriented.  Motor: B/l UE 5/5 proximal to distal RLE: HF 3+/5, KE 3+/5, ADF/PF 3+/5 (stable) LLE: HF, KE 3+/5, ADF/PF 4/5 (stronger than RLE) Skin: Skin is warm and dry.  Psychiatric: pleasant  Assessment/Plan: 1. Functional and neurological deficits secondary to multiple sclerosis which require 3+ hours per day of interdisciplinary therapy in a  comprehensive inpatient rehab setting. Physiatrist is providing close team supervision and 24 hour management of active medical problems listed below. Physiatrist and rehab team continue to assess barriers to discharge/monitor patient progress toward functional and medical goals.  Function:  Bathing Bathing position   Position: Sitting EOB  Bathing parts Body parts bathed by patient: Right arm, Left arm, Chest, Abdomen, Front perineal area, Buttocks, Right upper leg, Left upper leg, Right lower leg, Left lower leg Body parts bathed by helper: Back  Bathing assist Assist Level: Supervision or verbal cues      Upper Body Dressing/Undressing Upper body dressing   What is the patient wearing?: Pull over shirt/dress Bra - Perfomed by patient: Thread/unthread right bra strap, Thread/unthread left bra strap, Hook/unhook bra (pull down sports bra)   Pull over shirt/dress - Perfomed by patient: Thread/unthread right sleeve, Thread/unthread left sleeve, Put head through opening, Pull shirt over trunk          Upper body assist Assist Level: Set up   Set up : To obtain clothing/put away  Lower Body Dressing/Undressing Lower body dressing   What is the patient wearing?: Pants, Ted Hose, Non-skid slipper socks   Underwear - Performed by helper: Thread/unthread right underwear leg, Thread/unthread left underwear leg Pants- Performed by patient: Thread/unthread right pants leg, Thread/unthread left pants leg, Pull pants up/down, Fasten/unfasten pants Pants- Performed by helper: Thread/unthread right pants leg, Thread/unthread left pants leg, Pull pants up/down Non-skid slipper socks- Performed by patient: Don/doff right sock, Don/doff left sock Non-skid slipper socks- Performed by helper: Don/doff right sock, Don/doff left  sock     Shoes - Performed by patient: Don/doff right shoe, Don/doff left shoe Shoes - Performed by helper: Don/doff right shoe     TED Hose - Performed by patient:  Don/doff right TED hose, Don/doff left TED hose TED Hose - Performed by helper: Don/doff right TED hose, Don/doff left TED hose  Lower body assist Assist for lower body dressing: Supervision or verbal cues      Toileting Toileting   Toileting steps completed by patient: Performs perineal hygiene Toileting steps completed by helper: Adjust clothing prior to toileting, Adjust clothing after toileting Toileting Assistive Devices: Grab bar or rail  Toileting assist Assist level: Touching or steadying assistance (Pt.75%)   Transfers Chair/bed transfer   Chair/bed transfer method: Stand pivot Chair/bed transfer assist level: Touching or steadying assistance (Pt > 75%) Chair/bed transfer assistive device: Armrests     Locomotion Ambulation     Max distance: 50 Assist level: Touching or steadying assistance (Pt > 75%)   Wheelchair   Type: Manual Max wheelchair distance: 150 Assist Level: Supervision or verbal cues  Cognition Comprehension Comprehension assist level: Follows complex conversation/direction with extra time/assistive device  Expression Expression assist level: Expresses complex ideas: With extra time/assistive device  Social Interaction Social Interaction assist level: Interacts appropriately with others with medication or extra time (anti-anxiety, antidepressant).  Problem Solving Problem solving assist level: Solves complex problems: With extra time  Memory Memory assist level: More than reasonable amount of time   Medical Problem List and Plan: 1.  Decreased functional mobility with bilateral lower extremity weakness secondary to active demyelinating disease/multiple sclerosis.  Cont CIR 2.  DVT Prophylaxis/Anticoagulation: subcutaneous heparin. Monitor platelet counts any signs of bleeding 3. Pain Management/fibromyalgia: Neurontin 600 mg 3 times a day, Voltaren gel, oxycodone as needed for dysesthetic pain 4. Mood: provide emotional support  Patient require  significant encouragement 5. Neuropsych: This patient is capable of making decisions on her own behalf. 6. Skin/Wound Care: Routine skin checks 7. Fluids/Electrolytes/Nutrition:   8. Hypertension.  Norvasc 10 mg daily  Lisinopril changed to 20mg  BID on 10/2  Increased toprol to 75mg  daily on 9/29  Hydralazine 10 TID PRN started 10/2   Labile and uncontrolled at present. Hydralazine was started on 10/10, but DC'd due to low blood pressure. Acute urinary tract infection likely contributing to variable blood pressure recordings. We'll consider further adjustments after treatment of UTI. 9.Diabetes mellitus of peripheral neuropathy.Latest hemoglobin A1c of 14.    -NovoLog 6 units 3 times a day, decreased to 3U.   -lantus decreased to 15 units AM  -Lantus 5 U qhs started 10/8, increased to 10 on 10/10  SSI adjusted  Diebetic coordinator consulted  Improving control, will continue to monitor and consider further adjustments 10.Hyperlipidemia. Lipitor 11. Neurogenic bladder. Removed foley  -voiding trial  -cath prn  Flomax started on 10/2, increased on 10/5  Bethanechol 10 TID started 10/3, increased to 25 on 10/4, increased to 50 on 10/8 12.Medical noncompliance. Provide counseling 13. AKI  Cr 1.54 on 10/8  Encourage fluids  May need to d/c ACE  Labs ordered for tomorrow  Cont to monitor 14. Sleep disturbance  PRN Trazodone started 10/1 15. Cognition  Appreciate SLP eval 16. Leukocytosis: Resolved  Afebrile  WBCs 8.7 on 10/8  Labs ordered for tomorrow  17. ABLA  Hb 10.4 on 10/8  Labs ordered for tomorrow   Cont to monitor 18. Acute lower UTI   Empiric Macrobid started on 10/10  Urine culture with greater  than 100,000 gram-positive rods, sensitivities pending   LOS (Days) 15 A FACE TO FACE EVALUATION WAS PERFORMED  Ming Mcmannis Karis Juba, MD 12/07/2016 10:56 AM

## 2016-12-07 NOTE — Progress Notes (Signed)
Physical Therapy Note  Patient Details  Name: Debra [name] MRN: [id number] Date of Birth: 1965/12/21 Today's Date: 12/07/2016    Time: [phone] 55 minutes  1:1 Pt c/o pain in bilat hips, states she rec'd pain meds prior to session. Pt willing to participate in PT but requests "seated" exercises.  Pt performs all squat pivot transfers with supervision.  Gait 20' with RW with min guard.  w/c mobility throughout unit for UE strength with supervision.  Seated LAQ with 3 second hold 2 x 10.  Trunk PNF diagonals for core strengthening 2 x 10.  tricep push ups 2 x 10.  UBE 2 min fwd/2 min bckwd x 2 sets at level 2.  pt better able to tolerate UBE today.  Pt left in room with all needs at hand.   Sadiya Durand 12/07/2016, 10:52 AM

## 2016-12-07 NOTE — Patient Care Conference (Signed)
Inpatient RehabilitationTeam Conference and Plan of Care Update Date: 12/06/2016   Time: 2:35 PM    Patient Name: Debra [name]      Medical Record Number: [id number]  Date of Birth: 07-22-1965 Sex: Female         Room/Bed: 4W13C/4W13C-01 Payor Info: Payor: MEDICAID POTENTIAL / Plan: MEDICAID POTENTIAL / Product Type: *No Product type* /    Admitting Diagnosis: B lower EXT Weakness  Admit Date/Time:  11/22/2016  5:33 PM Admission Comments: No comment available   Primary Diagnosis:  <principal problem not specified> Principal Problem: <principal problem not specified>  Patient Active Problem List   Diagnosis Date Noted  . Labile blood pressure   . Acute blood loss anemia   . Leukocytosis   . Labile blood glucose   . Hypertensive crisis   . Hypoglycemia   . Poorly controlled type 2 diabetes mellitus with peripheral neuropathy (HCC)   . Uncontrolled hypertension   . Sleep disturbance   . Multiple sclerosis (HCC) 11/22/2016  . Thoracic root lesion 11/22/2016  . MS (multiple sclerosis) (HCC)   . Neuropathic pain   . Uncontrolled type 2 diabetes mellitus with peripheral neuropathy (HCC)   . Urinary retention   . Medically noncompliant   . Leg weakness, bilateral 11/20/2016  . Diabetic ketoacidosis without coma associated with type 1 diabetes mellitus (HCC)   . Leg pain   . History of CVA (cerebrovascular accident)   . Uncontrolled type 1 diabetes mellitus with diabetic peripheral neuropathy (HCC)   . Benign essential HTN   . Hypertensive urgency   . AKI (acute kidney injury) (HCC)   . Fibromyalgia 11/07/2016  . Back pain 10/31/2016  . Stable angina pectoris (HCC)   . Vitamin D deficiency 08/31/2014  . 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 Hyperlipidemia 10/06/2013  . Depression 10/16/2012  . Bilateral knee pain 02/16/2012  . Chronic diarrhea 10/19/2010  . Peripheral neuropathy 08/19/2010  . 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 Hypertension 07/06/2010  . Asthma 07/06/2010  . Migraine 07/06/2010    Expected Discharge Date: Expected Discharge Date: 12/13/16  Team Members Present: Physician leading conference: Dr. Maryla Morrow Social Worker Present: Amada Jupiter, LCSW Nurse Present: Kennon Portela, RN PT Present: Wanda Plump, PT OT Present: Rosalio Loud, OT SLP Present: Jackalyn Lombard, SLP PPS Coordinator present : Tora Duck, RN, CRRN     Current Status/Progress Goal Weekly Team Focus  Medical   Decreased functional mobility with bilateral lower extremity weakness secondary to active demyelinating disease/multiple sclerosis  Improve mobility, transfers, safety, uncontrolled DM/HTN, urinary retention, AKI  See above   Bowel/Bladder   continent of bowel, LBM 12/04/16, continent of bladder, but in out cath continued due to not emptying  ability to void without catheterization  continue to assist as needed & PVR, cath for vlumes >350   Swallow/Nutrition/ Hydration             ADL's   supervision/set-up UB bathing/dressing; min guard and set-up LB bathing/dressing; min guard sit > stand and stand pivot transfers; min guard/min A standing balance  mod I; supervision transfers; supervision meal prep  dynamic standing balance; d/c planning; functional mobility with RW; LB dressing; homemaking tasks   Mobility   min A gait, transfers  mod I  activity tolerance, balance, strength   Communication             Safety/Cognition/ Behavioral Observations  Pain   c/o pain  to BLE. pins & needles, has tylenol & oxycodone prn, scheduled neurontin TID, averages 1-2 prn daily  pain scale <3  continue to assess & treat as needed   Skin   no skin break down at this time, dry skin to bil feet  no new areas of skin break down  assess q shift      *See Care Plan and progress notes for long and short-term goals.     Barriers to Discharge  Current Status/Progress Possible Resolutions  Date Resolved   Physician    Decreased caregiver support;Medical stability;New diabetic;Incontinence;Neurogenic Bowel & Bladder;Lack of/limited family support;Medication compliance     See above  Therapies, optimize HTN/BP meds, follow labs, bladder meds, improve sleep, abx for ?UTI      Nursing  Medical stability  unstable glucose control            PT  Decreased caregiver support;Medical stability  blood sugar not controlled, limiting progress and participation              OT                  SLP                SW Decreased caregiver support;Insurance for SNF coverage;Other (comments) (Homeless) Continue to push pt to discuss assist need with family and friends.  Will discuss situation with Acute SW AD to see if any other options.     Either pt secures another home for d/c or hospital agrees to complete SSD/ MA app and pt would d/c to [facility] with LOG       Discharge Planning/Teaching Needs:  Pt now has not d/c option and she reports no family or friends who can assist her.  Have discussed possible shelter if she reaches mod ind goals.  No insurance and no applications begun for SSD or MA as financial counseling does not feel appropriate.  This Child psychotherapist is continuing to search for any housing support options.      Team Discussion:  Continues with very uncontrolled BP and DM and MD making daily adjustments to medications.  Still requires I/O caths.  Pt actually reports feeling worse overall when BP and BS in normal range.  Very apathetic about managing her medical and social issues.  SW reports pt is homeless and no payor source for placement.  Currently min assist overall and most goals downgraded to supervision due to BP and BS instability  Revisions to Treatment Plan:  Goals downgraded to supervision (see above)    Continued Need for Acute Rehabilitation Level of Care: The patient requires daily medical management by a physician with specialized training in physical medicine and  rehabilitation for the following conditions: Daily direction of a multidisciplinary physical rehabilitation program to ensure safe treatment while eliciting the highest outcome that is of practical value to the patient.: Yes Daily medical management of patient stability for increased activity during participation in an intensive rehabilitation regime.: Yes Daily analysis of laboratory values and/or radiology reports with any subsequent need for medication adjustment of medical intervention for : Neurological problems;Diabetes problems;Blood pressure problems;Renal problems;Urological problems  Gwenette Wellons 12/07/2016, 11:13 AM

## 2016-12-07 NOTE — Progress Notes (Signed)
Social Work Patient ID: Debra [name], female   DOB: Sep 15, 1965, 51 y.o.   MRN: [id number]   Met with pt again this afternoon to discuss d/c issues.  She states that she has spoken with her family and that she still has no one who can bring her into their home.  I asked if I might contact her aunt who is listed on her chart and she refuses this stating that she has already talked with her and "she can't help me."  I asked if she had informed her family that her only option will be to d/c to a local shelter and she says "yes".  At this point, I have discussed with pt and RN the need for pt to prepare for a shelter d/c and pt will need to demonstrate that she can manage self caths as well as DM management independently.  Pt nods in agreement but keeps eyes closed most of time we are talking.  I will continue to encourage her to talk with anyone who might be able to assist as well as updating financial counselor on care needs to see if SSD app would now be deemed appropriate.  Marcina Kinnison, LCSW

## 2016-12-07 NOTE — Progress Notes (Signed)
Occupational Therapy Weekly Progress Note  Patient Details  Name: Debra [name] MRN: [id number] Date of Birth: 04/08/1965  Beginning of progress report period: November 30, 2016 End of progress report period: December 07, 2016  Today's Date: 12/07/2016 OT Individual Time: 6789-3810 and 1335-1420 OT Individual Time Calculation (min): 56 min and 45 min   Short term goals not set due to estimated length of stay.  Pt is progressing towards long term goals. Pt currently requires min assist to min guard with self-care tasks and functional transfers with use of RW.  Pt progress has been limited secondary to medical issues with fluctuating blood glucose levels.  Due to fluctuating medical stability and decreased sensation in BLE as well as decreased awareness of situation/safety awareness, recommend downgraded goals for supervision.    Patient continues to demonstrate the following deficits: muscle weakness, decreased cardiorespiratoy endurance, unbalanced muscle activation, decreased coordination and decreased motor planning, decreased visual motor skillsand decreased standing balance, decreased postural control and decreased balance strategies and therefore will continue to benefit from skilled OT intervention to enhance overall performance with BADL and Reduce care partner burden.  See Patient's Care Plan for progression toward long term goals.  Patient not progressing toward long term goals.  See goal revision..  Plan of care revisions: downgraded to supervision overall.  Skilled Therapeutic Interventions/Progress Updates:    1) Treatment session with focus on functional mobility, standing balance, and sequencing with self-care tasks.  Pt received seated EOB finishing breakfast.  Engaged in bathing seated at EOB per pt preference.  Supervision/setup for items with pt able to complete sit > stand for LB bathing/dressing with min guard and cues for foot placement prior to standing as pt with preference  for wide BOS.  Ambulated to sink to complete oral care with supervision for standing due to decreased stability in standing.  Pt left seated upright in recliner with all needs in reach.  2) Treatment session with focus on functional mobility and dynamic reaching activity.  Pt received upright in w/c reporting pain in hips, RN notified and provided pain meds.  Engaged in ambulation around obstacles while reaching to obtain items from various surfaces to challenge dynamic balance when reaching outside BOS and problem solving positioning to increase safety while reaching.  Completed toilet transfer with supervision, unable to void but +BM with min assist for standing balance while pulling pants over hips.    Therapy Documentation Precautions:  Precautions Precautions: Fall Restrictions Weight Bearing Restrictions: No General:   Vital Signs: Therapy Vitals Temp: 98.8 F (37.1 C) Temp Source: Oral Pulse Rate: 75 Resp: 18 BP: (!) 165/76 Patient Position (if appropriate): Lying Oxygen Therapy SpO2: 93 % O2 Device: Not Delivered Pain:  Pt reports pain in hips and knees, RN notified.  See Function Navigator for Current Functional Status.   Therapy/Group: Individual Therapy  Rosalio Loud 12/07/2016, 7:52 AM

## 2016-12-07 NOTE — Plan of Care (Signed)
Problem: RH Balance Goal: LTG Patient will maintain dynamic standing with ADLs (OT) LTG:  Patient will maintain dynamic standing balance with assist during activities of daily living (OT)   Downgraded due to fluctuating medical status and stability  Problem: RH Bathing Goal: LTG Patient will bathe with assist, cues/equipment (OT) LTG: Patient will bathe specified number of body parts with assist with/without cues using equipment (position)  (OT)  Downgraded due to fluctuating medical status and stability  Problem: RH Dressing Goal: LTG Patient will perform upper body dressing (OT) LTG Patient will perform upper body dressing with assist, with/without cues (OT).  Downgraded due to fluctuating medical status and stability Goal: LTG Patient will perform lower body dressing w/assist (OT) LTG: Patient will perform lower body dressing with assist, with/without cues in positioning using equipment (OT)  Downgraded due to fluctuating medical status and stability  Problem: RH Toileting Goal: LTG Patient will perform toileting w/assist, cues/equip (OT) LTG: Patient will perform toiletiing (clothes management/hygiene) with assist, with/without cues using equipment (OT)  Downgraded due to fluctuating medical status and stability  Problem: RH Simple Meal Prep Goal: LTG Patient will perform simple meal prep w/assist (OT) LTG: Patient will perform simple meal prep with assistance, with/without cues (OT).  Downgraded due to fluctuating medical status and stability  Problem: RH Toilet Transfers Goal: LTG Patient will perform toilet transfers w/assist (OT) LTG: Patient will perform toilet transfers with assist, with/without cues using equipment (OT)  Downgraded due to fluctuating medical status and stability  Problem: RH Tub/Shower Transfers Goal: LTG Patient will perform tub/shower transfers w/assist (OT) LTG: Patient will perform tub/shower transfers with assist, with/without cues using equipment  (OT)  Downgraded due to fluctuating medical status and stability

## 2016-12-07 NOTE — Progress Notes (Signed)
RN attempted multiple times throughout shift yesterday and again this morning for pt to void before need to I&O cath. RN educated importance of attempt to void on [facility] or toilet to see if pt can empty bladder. Pt states she does not have the urge to void and does not want to try. Rn discussed that the urge might not be there but it is important to at least try. Pt continues to refuse. With each bladder scan I&O cath is needed.(q4-6hrs) RN educated on possible need to empty bladder at discharge who would be able to assist. Pt states she will not have anyone and she would be the one but she does not want to learn now or may not be able to do it. RN continued to educate on proper clean technique and importance of emptying the bladder. Pt with eyes closed and no interest of learning upon task. RN notified Allena Katz, MD with concerns.

## 2016-12-07 NOTE — Progress Notes (Signed)
Occupational Therapy Session Note  Patient Details  Name: Debra [name] MRN: [id number] Date of Birth: 04/23/1965  Today's Date: 12/07/2016 OT Individual Time: 1130-1200 OT Individual Time Calculation (min): 30 min    Short Term Goals: Week 2:  OT Short Term Goal 1 (Week 2): STG = LTGs OT Short Term Goal 1 - Progress (Week 2): Progressing toward goal Week 3:  OT Short Term Goal 1 (Week 3): STG = LTGs of supervision overall  Skilled Therapeutic Interventions/Progress Updates:    1:1 focus on functional ambulation RW, toileting, toilet transfers, and activity tolerance. Pt able to ambulate to the dayroom and round the circle and back to her room with one seated rest break with close supervision. Pt unable to void on toilet with voiding trial. Pt very flat with decr desire to set her own goals for progress.   Therapy Documentation Precautions:  Precautions Precautions: Fall Restrictions Weight Bearing Restrictions: No Pain: Pain Assessment Pain Score: 5  Pain Type: Acute pain Pain Location: Hip Pain Orientation: Right;Left Pain Descriptors / Indicators: Aching Pain Intervention(s): Medication (See eMAR)  See Function Navigator for Current Functional Status.   Therapy/Group: Individual Therapy  Roney Mans [facility] 12/07/2016, 3:43 PM

## 2016-12-07 NOTE — Progress Notes (Signed)
RN to continue to educate on importance of cathing and emptying bladder with self cath clean technique. RN will start and educate on process and proper technique with pt on toilet to provide adequate privacy for her discharge planning. RN will also work with patient on blood sugar checks with pt pricking her own finger and inserting blood into meter with staffs assistance. Regarding the discharge plan, pt needs to domonstrate self management of blood sugar checks and insulin administration. RN will pass along to on coming staff with continuing education plan.

## 2016-12-08 ENCOUNTER — Inpatient Hospital Stay (HOSPITAL_COMMUNITY): Payer: Self-pay | Admitting: Physical Therapy

## 2016-12-08 ENCOUNTER — Inpatient Hospital Stay (HOSPITAL_COMMUNITY): Payer: Self-pay | Admitting: Occupational Therapy

## 2016-12-08 LAB — BASIC METABOLIC PANEL
ANION GAP: 7 (ref 5–15)
BUN: 19 mg/dL (ref 6–20)
CO2: 27 mmol/L (ref 22–32)
Calcium: 8.8 mg/dL — ABNORMAL LOW (ref 8.9–10.3)
Chloride: 101 mmol/L (ref 101–111)
Creatinine, Ser: 1.36 mg/dL — ABNORMAL HIGH (ref 0.44–1.00)
GFR, EST AFRICAN AMERICAN: 52 mL/min — AB (ref >60)
GFR, EST NON AFRICAN AMERICAN: 45 mL/min — AB (ref >60)
Glucose, Bld: 374 mg/dL — ABNORMAL HIGH (ref 65–99)
POTASSIUM: 4.7 mmol/L (ref 3.5–5.1)
SODIUM: 135 mmol/L (ref 135–145)

## 2016-12-08 LAB — CBC WITH DIFFERENTIAL/PLATELET
BASOS ABS: 0.1 10*3/uL (ref 0.0–0.1)
Basophils Relative: 1 %
EOS PCT: 4 %
Eosinophils Absolute: 0.3 10*3/uL (ref 0.0–0.7)
HCT: 31.8 % — ABNORMAL LOW (ref 36.0–46.0)
Hemoglobin: 9.9 g/dL — ABNORMAL LOW (ref 12.0–15.0)
LYMPHS PCT: 22 %
Lymphs Abs: 1.9 10*3/uL (ref 0.7–4.0)
MCH: 28.4 pg (ref 26.0–34.0)
MCHC: 31.1 g/dL (ref 30.0–36.0)
MCV: 91.1 fL (ref 78.0–100.0)
Monocytes Absolute: 0.6 10*3/uL (ref 0.1–1.0)
Monocytes Relative: 7 %
NEUTROS PCT: 66 %
Neutro Abs: 5.5 10*3/uL (ref 1.7–7.7)
PLATELETS: 203 10*3/uL (ref 150–400)
RBC: 3.49 MIL/uL — AB (ref 3.87–5.11)
RDW: 15 % (ref 11.5–15.5)
WBC: 8.3 10*3/uL (ref 4.0–10.5)

## 2016-12-08 LAB — URINE CULTURE: Culture: >=100000 — AB

## 2016-12-08 LAB — GLUCOSE, CAPILLARY
GLUCOSE-CAPILLARY: 389 mg/dL — AB (ref 65–99)
GLUCOSE-CAPILLARY: 166 mg/dL — AB (ref 65–99)
GLUCOSE-CAPILLARY: 424 mg/dL — AB (ref 65–99)
Glucose-Capillary: 207 mg/dL — ABNORMAL HIGH (ref 65–99)

## 2016-12-08 MED ORDER — INSULIN ASPART 100 UNIT/ML ~~LOC~~ SOLN
7.0000 [IU] | Freq: Once | SUBCUTANEOUS | Status: AC
  Administered 2016-12-08: 7 [IU] via SUBCUTANEOUS

## 2016-12-08 NOTE — Progress Notes (Addendum)
Inpatient Diabetes Program Recommendations  AACE/ADA: New Consensus Statement on Inpatient Glycemic Control (2015)  Target Ranges:  Prepandial:   less than 140 mg/dL      Peak postprandial:   less than 180 mg/dL (1-2 hours)      Critically ill patients:  140 - 180 mg/dL   Results for BRINA, MEASE (MRN [id number]) as of 12/08/2016 11:35  Ref. Range 12/06/2016 06:25 12/06/2016 11:34 12/06/2016 13:34 12/06/2016 16:48 12/06/2016 20:46  Glucose-Capillary Latest Ref Range: 65 - 99 mg/dL 953 (H)  6 units total Novolog 125 (H)  0 units total Novolog (patient refused) 171 (H) 243 (H)  5 units total Novolog 252 (H)  3 units total Novolog   Results for ZAIYA, OHORA (MRN [id number]) as of 12/08/2016 11:35  Ref. Range 12/07/2016 07:19 12/07/2016 12:06 12/07/2016 16:48 12/07/2016 20:33  Glucose-Capillary Latest Ref Range: 65 - 99 mg/dL 861 (H)  0 units total Novolog 263 (H)  6 units total Novolog 179 (H)  3 units total Novolog 312 (H)  4 units total Novolog   Results for LUCCIA, LANGHORNE (MRN [id number]) as of 12/08/2016 11:35  Ref. Range 12/08/2016 06:17  Glucose-Capillary Latest Ref Range: 65 - 99 mg/dL 115 (H)  8 units total Novolog    Home DM Meds: Lantus 33 units daily       Novolog 10 units TID  Current Insulin Orders: Lantus 15 units AM/ 10 units PM      Novolog 0-8 units TID (no coverage until CBG >200 mg/dl)      Novolog 0-5 units SSI for bedtime Scale      Novolog 3 units TID with meals     MD- Note CBG very elevated this AM: 389 mg/dl.    Please consider the following in-hospital insulin adjustments:  1. Increase Bedtime Lantus dose to 15 units QHS  2. Increase Novolog Meal Coverage to: Novolog 4 units TID with meals (hold if pt eats <50% of meal)  3. Change Novolog SSI TID with meals to start coverage at CBG of 151 mg/dl:  520-802 mg/dl- 1 unit 233-612 mg/dl- 2 units 244-97 mg/dl- 3 units 530-051 mg/dl- 4 units 102-111 mg/dl- 5 units >735 mg/dl- Give 5  units Novolog and call MD      --Will follow patient during hospitalization--  Ambrose Finland RN, MSN, CDE Diabetes Coordinator Inpatient Glycemic Control Team Team Pager: [phone] (8a-5p)

## 2016-12-08 NOTE — Progress Notes (Signed)
Physical Therapy Weekly Progress Note  Patient Details  Name: DONZA OLTMAN MRN: [id number] Date of Birth: August 07, 1965  Beginning of progress report period: November 30, 2016 End of progress report period: December 08, 2016   Patient is making slow progress towards long term goals. Pt limited by medical issues with blood sugar and blood pressure limiting progress with therapy. Goals downgraded to supervision level.  Patient continues to demonstrate the following deficits muscle weakness, decreased coordination and decreased standing balance, decreased postural control and decreased balance strategies and therefore will continue to benefit from skilled PT intervention to increase functional independence with mobility.  Patient not progressing toward long term goals.  See goal revision..  Continue plan of care.    Skilled Therapeutic Interventions/Progress Updates:  Ambulation/gait training;Balance/vestibular training;Community reintegration;Discharge planning;DME/adaptive equipment instruction;Functional electrical stimulation;Functional mobility training;Neuromuscular re-education;Pain management;Patient/family education;Skin care/wound management;Stair training;Therapeutic Activities;Splinting/orthotics;UE/LE Coordination activities;Therapeutic Exercise;UE/LE Strength taining/ROM;Wheelchair propulsion/positioning    See Function Navigator for Current Functional Status.    [name],[name] 12/08/2016, 6:55 AM

## 2016-12-08 NOTE — Progress Notes (Signed)
Physical Therapy Session Note  Patient Details  Name: Debra [name] MRN: [id number] Date of Birth: 11-17-1965  Today's Date: 12/08/2016 PT Concurrent Time: 0930-1030 PT Concurrent Time Calculation (min): 60 min    Skilled Therapeutic Interventions/Progress Updates:  Pt participated in concurrent therapy with c/o pain in bilat hips, eases with rest.  Pt given meds prior to session.  Pt performed gait with RW and close supervision in controlled and home environments as well as with obstacle negotiation.  Standing balance step ups and tap ups with min A for balance.  nustep x 8 minutes for UE/LE strength and endurance level 4.  W/c mobility throughout unit with supervision, assistance for parts and brake management.  Pt assisted to bed with min A stand pivot transfer at end of session due to fatigue. Pt requires assist to lift Rt LE into bed.     See Function Navigator for Current Functional Status.   Therapy/Group: Concurrent therapy  Karisa Nesser 12/08/2016, 10:48 AM

## 2016-12-08 NOTE — Progress Notes (Signed)
Occupational Therapy Session Note  Patient Details  Name: Debra [name] MRN: [id number] Date of Birth: 04/13/1965  Today's Date: 12/08/2016 OT Individual Time: 4709-2957 OT Individual Time Calculation (min): 56 min    Short Term Goals: Week 2:  OT Short Term Goal 1 (Week 2): STG = LTGs OT Short Term Goal 1 - Progress (Week 2): Progressing toward goal  Skilled Therapeutic Interventions/Progress Updates:    Treatment focus on ADL retraining to promote progress towards LTGs in preparation for d/c. Pt received sitting EOB with breakfast tray reporting feeling dizzy. Therapist encouraged pt to finish breakfast and noting pt decreased appetite. Pt willing to participate in ADLs sitting EOB. Engaged in bathing sit > stand level with UE support supervision from therapist during standing to complete LB hygiene. Pt able to alternate UE support to advance pants over hips with supervision from therapist. Therapist noting ptt requiring frequent rest breaks and more than reasonable amount of time to complete self-care tasks due to dizziness and nausea. Returned to bed with therapist providing min A to advance RLE on EOB. Pt requesting pain medication with RN notified and in room at end of session.  Therapy Documentation Precautions:  Precautions Precautions: Fall Restrictions Weight Bearing Restrictions: No Pain:  C/o pain rating 7/10 in BLE but willing to participate in session  See Function Navigator for Current Functional Status.   Therapy/Group: Individual Therapy  [name] 12/08/2016, 12:03 PM

## 2016-12-08 NOTE — Progress Notes (Signed)
[place] PHYSICAL MEDICINE & REHABILITATION     PROGRESS NOTE    Subjective/Complaints: No new complaints. Disappointed that she has "no place to go"  Review of Systems  Respiratory: Negative for sputum production and shortness of breath.   Gastrointestinal: Negative for abdominal pain and diarrhea.  Musculoskeletal: Negative for myalgias.  Skin: Negative for itching.  Neurological: Positive for sensory change and focal weakness.  Psychiatric/Behavioral: Negative for depression and suicidal ideas.    Objective: Vital Signs: Blood pressure (!) 165/76, pulse 75, temperature 98.8 F (37.1 C), temperature source Oral, resp. rate 18, height 5\' 4"  (1.626 m), weight 76.2 kg (168 lb 0.8 oz), SpO2 93 %. No results found.  Recent Labs  12/08/16 0621  WBC 8.3  HGB 9.9*  HCT 31.8*  PLT 203    Recent Labs  12/08/16 0621  NA 135  K 4.7  CL 101  GLUCOSE 374*  BUN 19  CREATININE 1.36*  CALCIUM 8.8*   CBG (last 3)   Recent Labs  12/07/16 1648 12/07/16 2033 12/08/16 0617  GLUCAP 179* 312* 389*    Wt Readings from Last 3 Encounters:  12/06/16 76.2 kg (168 lb 0.8 oz)  11/18/16 76.2 kg (167 lb 15.9 oz)  11/07/16 68.4 kg (150 lb 12.8 oz)    Physical Exam:  Constitutional: She appears well-developed and well-nourished.  HENT: Normocephalic and atraumatic.  Eyes: EOM are normal. No discharge.  Cardiovascular: RRR without murmur. No JVD        Respiratory: CTA Bilaterally without wheezes or rales. Normal effort   GI: BS +, Non-distended  Musculoskeletal: No tenderness, no edema in extremities.  Neurological: She is alert and oriented.  Motor: B/l UE 5/5 proximal to distal RLE: HF 3+/5, KE 3+/5, ADF/PF 3+/5 (no change) LLE: HF, KE 3+/5, ADF/PF 4/5 (sl stronger than right) Skin: Skin is warm and dry.  Psychiatric: pleasant  Assessment/Plan: 1. Functional and neurological deficits secondary to multiple sclerosis which require 3+ hours per day of interdisciplinary  therapy in a comprehensive inpatient rehab setting. Physiatrist is providing close team supervision and 24 hour management of active medical problems listed below. Physiatrist and rehab team continue to assess barriers to discharge/monitor patient progress toward functional and medical goals.  Function:  Bathing Bathing position   Position: Sitting EOB  Bathing parts Body parts bathed by patient: Right arm, Left arm, Chest, Abdomen, Front perineal area, Buttocks, Right upper leg, Left upper leg, Right lower leg, Left lower leg Body parts bathed by helper: Back  Bathing assist Assist Level: Supervision or verbal cues      Upper Body Dressing/Undressing Upper body dressing   What is the patient wearing?: Pull over shirt/dress Bra - Perfomed by patient: Thread/unthread right bra strap, Thread/unthread left bra strap, Hook/unhook bra (pull down sports bra)   Pull over shirt/dress - Perfomed by patient: Thread/unthread right sleeve, Thread/unthread left sleeve, Put head through opening, Pull shirt over trunk          Upper body assist Assist Level: Set up   Set up : To obtain clothing/put away  Lower Body Dressing/Undressing Lower body dressing   What is the patient wearing?: Pants, Ted Hose, Non-skid slipper socks   Underwear - Performed by helper: Thread/unthread right underwear leg, Thread/unthread left underwear leg Pants- Performed by patient: Thread/unthread right pants leg, Thread/unthread left pants leg, Pull pants up/down, Fasten/unfasten pants Pants- Performed by helper: Thread/unthread right pants leg, Thread/unthread left pants leg, Pull pants up/down Non-skid slipper socks- Performed by patient:  Don/doff right sock, Don/doff left sock Non-skid slipper socks- Performed by helper: Don/doff right sock, Don/doff left sock     Shoes - Performed by patient: Don/doff right shoe, Don/doff left shoe Shoes - Performed by helper: Don/doff right shoe     TED Hose - Performed by  patient: Don/doff right TED hose, Don/doff left TED hose TED Hose - Performed by helper: Don/doff right TED hose, Don/doff left TED hose  Lower body assist Assist for lower body dressing: Supervision or verbal cues      Toileting Toileting   Toileting steps completed by patient: Performs perineal hygiene, Adjust clothing prior to toileting Toileting steps completed by helper: Adjust clothing after toileting Toileting Assistive Devices: Grab bar or rail  Toileting assist Assist level: Touching or steadying assistance (Pt.75%)   Transfers Chair/bed transfer   Chair/bed transfer method: Stand pivot Chair/bed transfer assist level: Touching or steadying assistance (Pt > 75%) Chair/bed transfer assistive device: Armrests     Locomotion Ambulation     Max distance: 50 Assist level: Touching or steadying assistance (Pt > 75%)   Wheelchair   Type: Manual Max wheelchair distance: 150 Assist Level: Supervision or verbal cues  Cognition Comprehension Comprehension assist level: Follows complex conversation/direction with extra time/assistive device  Expression Expression assist level: Expresses complex ideas: With extra time/assistive device  Social Interaction Social Interaction assist level: Interacts appropriately with others with medication or extra time (anti-anxiety, antidepressant).  Problem Solving Problem solving assist level: Solves complex problems: With extra time  Memory Memory assist level: More than reasonable amount of time   Medical Problem List and Plan: 1.  Decreased functional mobility with bilateral lower extremity weakness secondary to active demyelinating disease/multiple sclerosis.  Cont CIR. dispo remains an issue 2.  DVT Prophylaxis/Anticoagulation: subcutaneous heparin. Monitor platelet counts any signs of bleeding 3. Pain Management/fibromyalgia: Neurontin 600 mg 3 times a day, Voltaren gel, oxycodone as needed for dysesthetic pain 4. Mood: provide emotional  support  -team providing ego suppot 5. Neuropsych: This patient is capable of making decisions on her own behalf. 6. Skin/Wound Care: Routine skin checks 7. Fluids/Electrolytes/Nutrition:   8. Hypertension.  Norvasc 10 mg daily  Lisinopril changed to 20mg  BID on 10/2  Increased toprol to 75mg  daily on 9/29  Hydralazine 10 TID PRN started 10/2   Remains labile but some improvement over last two days. Hydralazine was started on 10/10, but DC'd due to low blood pressure. Acute urinary tract infection likely contributing to variable blood pressure recordings.  -observe today, no changes   9.Diabetes mellitus of peripheral neuropathy.Latest hemoglobin A1c of 14.    -NovoLog 6 units 3 times a day, decreased to 3U.   -lantus decreased to 15 units AM  -Lantus 5 U qhs started 10/8, increased to 10 on 10/10, needs further adjustment as this morning's cbg was 389---increase to 15u tonight   SSI adjusted  Diabetic coordinator consulted    10.Hyperlipidemia. Lipitor 11. Neurogenic bladder. Removed foley  -continue voiding trial  -cath prn  Flomax started on 10/2, increased on 10/5  Bethanechol 10 TID started 10/3, increased to 25 on 10/4, increased to 50 on 10/8--still with large bladder volumes 12.Medical noncompliance. Provide counseling 13. AKI  Cr 1.54 on 10/8---down to 1.36 today  Encourage fluids  Continue same medications and follow serially  -recheck monday    14. Sleep disturbance  PRN Trazodone started 10/1 15. Cognition  Appreciate SLP eval 16. Leukocytosis: Resolved  Afebrile  WBCs 8.7 on 10/8 and 8.3 today  17. ABLA  Hb 10.4 on 10/8 and 9.9 today  -recheck Monday 18. Acute lower UTI   Empiric Macrobid started on 10/10--continue for 7 days course  Urine culture with greater than 100,000 e coli, pan-sensitive   LOS (Days) 16 A FACE TO FACE EVALUATION WAS PERFORMED  Faith Rogue T, MD 12/08/2016 9:25 AM

## 2016-12-08 NOTE — Progress Notes (Signed)
Physical Therapy Session Note  Patient Details  Name: Debra [name] MRN: [id number] Date of Birth: 08-Oct-1965  Today's Date: 12/08/2016 PT Individual Time: 1345-1425 PT Individual Time Calculation (min): 40 min   Short Term Goals: Week 2:  PT Short Term Goal 1 (Week 2): STG=LTG  Skilled Therapeutic Interventions/Progress Updates: Pt received seated on EOB, c/o pain in B knees and B shoulders, pre-medicated, and agreeable to treatment. Stand pivot transfer bed>w/c with close S, no AD. W/c propulsion with BUE throughout session for strengthening and aerobic endurance, S overall with occasional rest breaks d/t fatigue. Dynamic gait/balance to walk into laundry room and move laundry from washer to dryer. Stand pivot transfer w/c <>mat table with RW and S. Core strengthening exercises performed on edge of mat table including russian twist, initially performed with 1kg weighted ball however reduced after first set to only weight of arms with hands interlaced d/t pt report that weighted ball was too heavy. Performed 2 additional sets UE weight only x20 reps. Semi-reclined situps on wedge AAROM >AROM x10 reps total. Returned to room w/c propulsion with S. Stand pivot transfer to bed with S. Sit >supine with S and increased time. Remained semi-reclined in bed at end of session, bed alarm intact and all needs in reach.      Therapy Documentation Precautions:  Precautions Precautions: Fall Restrictions Weight Bearing Restrictions: No   See Function Navigator for Current Functional Status.   Therapy/Group: Individual Therapy  Vista Lawman 12/08/2016, 2:30 PM

## 2016-12-08 NOTE — Plan of Care (Signed)
Problem: RH BLADDER ELIMINATION Goal: RH STG MANAGE BLADDER WITH ASSISTANCE STG Manage Bladder With min Assistance  Outcome: Not Progressing Requires in and out cath.   

## 2016-12-08 NOTE — Progress Notes (Signed)
Occupational Therapy Session Note  Patient Details  Name: BALJINDER RADEN MRN: [id number] Date of Birth: 1966/02/24  Today's Date: 12/08/2016 OT Individual Time: 4562-5638 OT Individual Time Calculation (min): 24 min    Short Term Goals: Week 2:  OT Short Term Goal 1 (Week 2): STG = LTGs OT Short Term Goal 1 - Progress (Week 2): Progressing toward goal  Skilled Therapeutic Interventions/Progress Updates:    Treatment focus on dynamic standing balance and homemaking tasks to facilitate normal home routine and address safety concerns in preparation for d/c. Pt received in bed reporting feeling nauseous and dizzy. RN in room upon arrival. Blood glucose reading: 207 at beginning of session. Therapist providing encouragement to attempt OOB activity with pt willing to participate. Pt obtained clothing from lower level surface to challenge dynamic standing balance required for ADLs/IADLs with therapist providing close supervision. Ambulated ~25' pushing w/c to transport dirty clothes to laundry room with therapist providing min guard for safety. Pt completing laundry task using washing machine for UE support and close supervision from therapist. Therapist providing pt education of alternative options (seated vs standing) to complete task safely and independently and utilizing AE. Pt returned to room sitting EOB with all needs in reach.  Therapy Documentation Precautions:  Precautions Precautions: Fall Restrictions Weight Bearing Restrictions: No Pain:  C/o pain rating 7/10 in lower back and BLE but willing to participate in therapy  See Function Navigator for Current Functional Status.   Therapy/Group: Individual Therapy  [name] 12/08/2016, 12:10 PM

## 2016-12-09 ENCOUNTER — Inpatient Hospital Stay (HOSPITAL_COMMUNITY): Payer: Self-pay | Admitting: Occupational Therapy

## 2016-12-09 DIAGNOSIS — M2550 Pain in unspecified joint: Secondary | ICD-10-CM

## 2016-12-09 LAB — GLUCOSE, CAPILLARY
GLUCOSE-CAPILLARY: 289 mg/dL — AB (ref 65–99)
GLUCOSE-CAPILLARY: 394 mg/dL — AB (ref 65–99)
Glucose-Capillary: 283 mg/dL — ABNORMAL HIGH (ref 65–99)
Glucose-Capillary: 237 mg/dL — ABNORMAL HIGH (ref 65–99)

## 2016-12-09 MED ORDER — HYDRALAZINE HCL 10 MG PO TABS
10.0000 mg | ORAL_TABLET | Freq: Three times a day (TID) | ORAL | Status: DC
  Administered 2016-12-09 – 2016-12-10 (×3): 10 mg via ORAL
  Filled 2016-12-09 (×3): qty 1

## 2016-12-09 NOTE — Progress Notes (Signed)
Occupational Therapy Session Note  Patient Details  Name: GISSELL BARRA MRN: [id number] Date of Birth: [birth year]  Today's Date: 12/09/2016 OT Individual Time: 1120-1207 OT Individual Time Calculation (min): 47 min   Short Term Goals: Week 1:  OT Short Term Goal 1 (Week 1): STG = LTGs due to ELOS  Skilled Therapeutic Interventions/Progress Updates:    Pt greeted semi-reclined in bed after being catheterized by nursing staff. Pt completed squat-pivot to wc with supervision 2/2 pt report of 7/10 pain in hips. Bathing/dressing completed at the sink with sit<>stand with overall supervision and increased time and multiple rest breaks. Close supervision for dynamic balance while pulling up pants. Educated pt on energy conservation techniques during BADL with pt verbalizing understanding. Pt set-up for lunch and left seated in wc with needs met and call bell in reach.   Therapy Documentation Precautions:  Precautions Precautions: Fall Restrictions Weight Bearing Restrictions: No Pain: Pain Assessment Pain Assessment: 0-10 Pain Score: 7  Pain Type: Acute pain Pain Location: Hip Pain Orientation: Right;Left Pain Descriptors / Indicators: Aching Pain Onset: On-going Pain Intervention(s): Repositioned See Function Navigator for Current Functional Status.   Therapy/Group: Individual Therapy  Valma Cava 12/09/2016, 12:44 PM

## 2016-12-09 NOTE — Progress Notes (Signed)
[place] PHYSICAL MEDICINE & REHABILITATION     PROGRESS NOTE    Subjective/Complaints: Complaints of bilateral knee and hip pain as well as low back pain.  Review of Systems  Respiratory: Negative for sputum production and shortness of breath.   Gastrointestinal: Negative for abdominal pain and diarrhea.  Musculoskeletal: Negative for myalgias.  Skin: Negative for itching.  Neurological: Positive for sensory change and focal weakness.  Psychiatric/Behavioral: Negative for depression and suicidal ideas.    Objective: Vital Signs: Blood pressure (!) 186/80, pulse 75, temperature 98.4 F (36.9 C), temperature source Oral, resp. rate 16, height 5\' 4"  (1.626 m), weight 76.2 kg (168 lb 0.8 oz), SpO2 93 %. No results found.  Recent Labs  12/08/16 0621  WBC 8.3  HGB 9.9*  HCT 31.8*  PLT 203    Recent Labs  12/08/16 0621  NA 135  K 4.7  CL 101  GLUCOSE 374*  BUN 19  CREATININE 1.36*  CALCIUM 8.8*   CBG (last 3)   Recent Labs  12/08/16 1643 12/08/16 2111 12/09/16 0643  GLUCAP 166* 424* 283*    Wt Readings from Last 3 Encounters:  12/06/16 76.2 kg (168 lb 0.8 oz)  11/18/16 76.2 kg (167 lb 15.9 oz)  11/07/16 68.4 kg (150 lb 12.8 oz)    Physical Exam:  Constitutional: She appears well-developed and well-nourished.  HENT: Normocephalic and atraumatic.  Eyes: EOM are normal. No discharge.  Cardiovascular: RRR without murmur. No JVD        Respiratory: CTA Bilaterally without wheezes or rales. Normal effort   GI: BS +, Non-distended  Musculoskeletal: No tenderness, no edema in extremities. Pain with hip and knee joint range of motion. No evidence of knee effusion, no pain with palpation over the thighs or calves  Neurological: She is alert and oriented.  Motor: B/l UE 5/5 proximal to distal RLE: HF 3+/5, KE 3+/5, ADF/PF 3+/5 (no change) LLE: HF, KE 3+/5, ADF/PF 4/5 (sl stronger than right) Skin: Skin is warm and dry.  Psychiatric:  pleasant  Assessment/Plan: 1. Functional and neurological deficits secondary to multiple sclerosis which require 3+ hours per day of interdisciplinary therapy in a comprehensive inpatient rehab setting. Physiatrist is providing close team supervision and 24 hour management of active medical problems listed below. Physiatrist and rehab team continue to assess barriers to discharge/monitor patient progress toward functional and medical goals.  Function:  Bathing Bathing position   Position: Sitting EOB  Bathing parts Body parts bathed by patient: Right arm, Left arm, Chest, Abdomen, Front perineal area, Buttocks, Right upper leg, Left upper leg, Right lower leg, Left lower leg Body parts bathed by helper: Back  Bathing assist Assist Level: Supervision or verbal cues      Upper Body Dressing/Undressing Upper body dressing   What is the patient wearing?: Pull over shirt/dress Bra - Perfomed by patient: Thread/unthread right bra strap, Thread/unthread left bra strap, Hook/unhook bra (pull down sports bra)   Pull over shirt/dress - Perfomed by patient: Thread/unthread right sleeve, Thread/unthread left sleeve, Put head through opening, Pull shirt over trunk          Upper body assist Assist Level: Set up   Set up : To obtain clothing/put away  Lower Body Dressing/Undressing Lower body dressing   What is the patient wearing?: Pants, Ted Hose, Non-skid slipper socks   Underwear - Performed by helper: Thread/unthread right underwear leg, Thread/unthread left underwear leg Pants- Performed by patient: Thread/unthread right pants leg, Thread/unthread left pants leg, Pull  pants up/down Pants- Performed by helper: Thread/unthread right pants leg, Thread/unthread left pants leg, Pull pants up/down Non-skid slipper socks- Performed by patient: Don/doff right sock, Don/doff left sock Non-skid slipper socks- Performed by helper: Don/doff right sock, Don/doff left sock     Shoes - Performed  by patient: Don/doff right shoe, Don/doff left shoe Shoes - Performed by helper: Don/doff right shoe     TED Hose - Performed by patient: Don/doff right TED hose, Don/doff left TED hose TED Hose - Performed by helper: Don/doff right TED hose, Don/doff left TED hose  Lower body assist Assist for lower body dressing: Supervision or verbal cues      Toileting Toileting   Toileting steps completed by patient: Adjust clothing prior to toileting, Performs perineal hygiene Toileting steps completed by helper: Adjust clothing after toileting Toileting Assistive Devices: Grab bar or rail  Toileting assist Assist level: Touching or steadying assistance (Pt.75%)   Transfers Chair/bed transfer   Chair/bed transfer method: Stand pivot Chair/bed transfer assist level: Supervision or verbal cues Chair/bed transfer assistive device: Armrests     Locomotion Ambulation     Max distance: 50 Assist level: Touching or steadying assistance (Pt > 75%)   Wheelchair   Type: Manual Max wheelchair distance: 150 Assist Level: Supervision or verbal cues  Cognition Comprehension Comprehension assist level: Follows complex conversation/direction with extra time/assistive device  Expression Expression assist level: Expresses complex ideas: With extra time/assistive device  Social Interaction Social Interaction assist level: Interacts appropriately with others with medication or extra time (anti-anxiety, antidepressant).  Problem Solving Problem solving assist level: Solves complex problems: With extra time  Memory Memory assist level: More than reasonable amount of time   Medical Problem List and Plan: 1.  Decreased functional mobility with bilateral lower extremity weakness secondary to active demyelinating disease/multiple sclerosis.  Cont CIR. dispo remains an issue 2.  DVT Prophylaxis/Anticoagulation: subcutaneous heparin. Monitor platelet counts any signs of bleeding 3. Pain Management/fibromyalgia:  Neurontin 600 mg 3 times a day, Voltaren gel, oxycodone as needed for dysesthetic pain Joint arthralgias, probably from Lipitor. We'll hold 4. Mood: provide emotional support  -team providing ego suppot 5. Neuropsych: This patient is capable of making decisions on her own behalf. 6. Skin/Wound Care: Routine skin checks 7. Fluids/Electrolytes/Nutrition:   8. Hypertension.  Norvasc 10 mg daily  Lisinopril changed to 20mg  BID on 10/2  Increased toprol to 75mg  daily on 9/29  Hydralazine 10 TID schedule   Elevated systolic, increase hydralazine Vitals:   12/08/16 2020 12/09/16 0415  BP: (!) 175/78 (!) 186/80  Pulse:  75  Resp:  16  Temp:  98.4 F (36.9 C)  SpO2:  93%    -observe today, no changes   9.Diabetes mellitus of peripheral neuropathy.Latest hemoglobin A1c of 14.    -NovoLog 6 units 3 times a day, decreased to 3U.   -lantus decreased to 15 units AM  -Lantus 5 U qhs started 10/8, increased to 10 on 10/10, needs further adjustment as this morning's cbg was 389---increase to 15u tonight   SSI adjusted  Diabetic coordinator consulted    10.Hyperlipidemia. Lipitor 11. Neurogenic bladder. Removed foley  -continue voiding trial  -cath prn  Flomax started on 10/2, increased on 10/5  Bethanechol 10 TID started 10/3, increased to 25 on 10/4, increased to 50 on 10/8--still with large bladder volumes 12.Medical noncompliance. Provide counseling 13. AKI  Cr 1.54 on 10/8---down to 1.36 today  Encourage fluids  Continue same medications and follow serially  -recheck monday  14. Sleep disturbance  PRN Trazodone started 10/1 15. Cognition  Appreciate SLP eval 16. Leukocytosis: Resolved  Afebrile  WBCs 8.7 on 10/8 and 8.3 today  17. ABLA  Hb 10.4 on 10/8 and 9.9 today  -recheck Monday 18. Acute lower UTI   Empiric Macrobid started on 10/10--continue for 7 days course  Urine culture with greater than 100,000 e coli, pan-sensitive   LOS (Days) 17 A FACE TO FACE EVALUATION  WAS PERFORMED  Erick Colace, MD 12/09/2016 12:10 PM

## 2016-12-10 ENCOUNTER — Inpatient Hospital Stay (HOSPITAL_COMMUNITY): Payer: Self-pay

## 2016-12-10 LAB — GLUCOSE, CAPILLARY
GLUCOSE-CAPILLARY: 304 mg/dL — AB (ref 65–99)
GLUCOSE-CAPILLARY: 271 mg/dL — AB (ref 65–99)
GLUCOSE-CAPILLARY: 262 mg/dL — AB (ref 65–99)
Glucose-Capillary: 257 mg/dL — ABNORMAL HIGH (ref 65–99)
Glucose-Capillary: 322 mg/dL — ABNORMAL HIGH (ref 65–99)

## 2016-12-10 MED ORDER — HYDRALAZINE HCL 25 MG PO TABS
25.0000 mg | ORAL_TABLET | Freq: Three times a day (TID) | ORAL | Status: DC
  Administered 2016-12-10 – 2016-12-11 (×3): 25 mg via ORAL
  Filled 2016-12-10 (×3): qty 1

## 2016-12-10 MED ORDER — INSULIN GLARGINE 100 UNIT/ML ~~LOC~~ SOLN
15.0000 [IU] | Freq: Every day | SUBCUTANEOUS | Status: DC
  Administered 2016-12-10 – 2016-12-12 (×3): 15 [IU] via SUBCUTANEOUS
  Filled 2016-12-10 (×3): qty 0.15

## 2016-12-10 NOTE — Progress Notes (Signed)
Occupational Therapy Session Note  Patient Details  Name: Debra [name] MRN: [id number] Date of Birth: Nov 10, 1965  Today's Date: 12/10/2016 OT Individual Time: 1300-1343 OT Individual Time Calculation (min): 43 min    Short Term Goals: Week 3:  OT Short Term Goal 1 (Week 3): STG = LTGs of supervision overall  Skilled Therapeutic Interventions/Progress Updates:    1;1. Pt with c/o pain in B hips, however does not want to alert RN. Pt stand pivot transfer with RW EOB<>w/c with supervision and VC for pushing from bed/not pulling on walker. Pt stands with supervision to play game of wii bowling with min VC for technique/sequencing wii remote use. Pt practices weight shifting laterally on wii fit board playing wii skiing/soccer game with CGA. Pt demo decreased reaction timing to weight shifting missing 90% of targets. Pt returns to bed with MIN A to bring RLE up EOB>supine. Call light in reach and all needs met before exiting session  Therapy Documentation Precautions:  Precautions Precautions: Fall Restrictions Weight Bearing Restrictions: No  See Function Navigator for Current Functional Status.   Therapy/Group: Individual Therapy  Tonny Branch 12/10/2016, 1:41 PM

## 2016-12-10 NOTE — Progress Notes (Signed)
[place] PHYSICAL MEDICINE & REHABILITATION     PROGRESS NOTE    Subjective/Complaints: Complaints of bilateral knee and hip pain , We discussed Lipitor and adverse effects such as arthralgia, patient is now off this medication. We discussed that it may take several days for it to get out of her system  Review of Systems  Respiratory: Negative for sputum production and shortness of breath.   Gastrointestinal: Negative for abdominal pain and diarrhea.  Musculoskeletal: Negative for myalgias.  Skin: Negative for itching.  Neurological: Positive for sensory change and focal weakness.  Psychiatric/Behavioral: Negative for depression and suicidal ideas.    Objective: Vital Signs: Blood pressure (!) 186/86, pulse 72, temperature 97.8 F (36.6 C), temperature source Oral, resp. rate 16, height 5\' 4"  (1.626 m), weight 76.2 kg (168 lb 0.8 oz), SpO2 95 %. No results found.  Recent Labs  12/08/16 0621  WBC 8.3  HGB 9.9*  HCT 31.8*  PLT 203    Recent Labs  12/08/16 0621  NA 135  K 4.7  CL 101  GLUCOSE 374*  BUN 19  CREATININE 1.36*  CALCIUM 8.8*   CBG (last 3)   Recent Labs  12/09/16 1621 12/09/16 2358 12/10/16 0620  GLUCAP 289* 394* 271*    Wt Readings from Last 3 Encounters:  12/06/16 76.2 kg (168 lb 0.8 oz)  11/18/16 76.2 kg (167 lb 15.9 oz)  11/07/16 68.4 kg (150 lb 12.8 oz)    Physical Exam:  Constitutional: She appears well-developed and well-nourished.  HENT: Normocephalic and atraumatic.  Eyes: EOM are normal. No discharge.  Cardiovascular: RRR without murmur. No JVD        Respiratory: CTA Bilaterally without wheezes or rales. Normal effort   GI: BS +, Non-distended  Musculoskeletal: No tenderness, no edema in extremities. Pain with hip and knee joint range of motion. No evidence of knee effusion, no pain with palpation over the thighs or calves  Neurological: She is alert and oriented.  Motor: B/l UE 5/5 proximal to distal RLE: HF 3+/5, KE 3+/5,  ADF/PF 3+/5 (no change) LLE: HF, KE 3+/5, ADF/PF 4/5 (sl stronger than right) Skin: Skin is warm and dry.  Psychiatric: pleasant  Assessment/Plan: 1. Functional and neurological deficits secondary to multiple sclerosis which require 3+ hours per day of interdisciplinary therapy in a comprehensive inpatient rehab setting. Physiatrist is providing close team supervision and 24 hour management of active medical problems listed below. Physiatrist and rehab team continue to assess barriers to discharge/monitor patient progress toward functional and medical goals.  Function:  Bathing Bathing position   Position: Sitting EOB  Bathing parts Body parts bathed by patient: Right arm, Left arm, Chest, Abdomen, Front perineal area, Buttocks, Right upper leg, Left upper leg, Right lower leg, Left lower leg Body parts bathed by helper: Back  Bathing assist Assist Level: Supervision or verbal cues      Upper Body Dressing/Undressing Upper body dressing   What is the patient wearing?: Pull over shirt/dress Bra - Perfomed by patient: Thread/unthread right bra strap, Thread/unthread left bra strap, Hook/unhook bra (pull down sports bra)   Pull over shirt/dress - Perfomed by patient: Thread/unthread right sleeve, Thread/unthread left sleeve, Put head through opening, Pull shirt over trunk          Upper body assist Assist Level: Set up   Set up : To obtain clothing/put away  Lower Body Dressing/Undressing Lower body dressing   What is the patient wearing?: Pants, Ted Hose, Non-skid slipper socks  Underwear - Performed by helper: Thread/unthread right underwear leg, Thread/unthread left underwear leg Pants- Performed by patient: Thread/unthread right pants leg, Thread/unthread left pants leg, Pull pants up/down Pants- Performed by helper: Thread/unthread right pants leg, Thread/unthread left pants leg, Pull pants up/down Non-skid slipper socks- Performed by patient: Don/doff right sock, Don/doff  left sock Non-skid slipper socks- Performed by helper: Don/doff right sock, Don/doff left sock     Shoes - Performed by patient: Don/doff right shoe, Don/doff left shoe Shoes - Performed by helper: Don/doff right shoe     TED Hose - Performed by patient: Don/doff right TED hose, Don/doff left TED hose TED Hose - Performed by helper: Don/doff right TED hose, Don/doff left TED hose  Lower body assist Assist for lower body dressing: Supervision or verbal cues      Toileting Toileting   Toileting steps completed by patient: Adjust clothing prior to toileting, Performs perineal hygiene Toileting steps completed by helper: Adjust clothing after toileting Toileting Assistive Devices: Grab bar or rail  Toileting assist Assist level: Touching or steadying assistance (Pt.75%)   Transfers Chair/bed transfer   Chair/bed transfer method: Stand pivot Chair/bed transfer assist level: Supervision or verbal cues Chair/bed transfer assistive device: Armrests     Locomotion Ambulation     Max distance: 50 Assist level: Touching or steadying assistance (Pt > 75%)   Wheelchair   Type: Manual Max wheelchair distance: 150 Assist Level: Supervision or verbal cues  Cognition Comprehension Comprehension assist level: Follows complex conversation/direction with extra time/assistive device  Expression Expression assist level: Expresses complex ideas: With extra time/assistive device  Social Interaction Social Interaction assist level: Interacts appropriately with others with medication or extra time (anti-anxiety, antidepressant).  Problem Solving Problem solving assist level: Solves complex problems: With extra time  Memory Memory assist level: More than reasonable amount of time   Medical Problem List and Plan: 1.  Decreased functional mobility with bilateral lower extremity weakness secondary to active demyelinating disease/multiple sclerosis.  Cont CIR. dispo remains an issue 2.  DVT  Prophylaxis/Anticoagulation: subcutaneous heparin. Monitor platelet counts any signs of bleeding 3. Pain Management/fibromyalgia: Neurontin 600 mg 3 times a day, Voltaren gel, oxycodone as needed for dysesthetic pain Joint arthralgias, probably from Lipitor. Has been discontinued. She only started this medicine about a week before admission to hospital. She may follow up with her primary care physician to restart another agent 4. Mood: provide emotional support  -team providing ego suppot 5. Neuropsych: This patient is capable of making decisions on her own behalf. 6. Skin/Wound Care: Routine skin checks 7. Fluids/Electrolytes/Nutrition:   8. Hypertension.  Norvasc 10 mg daily  Lisinopril changed to  BID on 10/2  Increased toprol to  daily on 9/29  Hydralazine 25 TID scheduled starting 10/14    Vitals:   12/09/16 1645 12/10/16 0606  BP: (!) 176/85 (!) 186/86  Pulse: 96 72  Resp: 16 16  Temp: 98 F (36.7 C) 97.8 F (36.6 C)  SpO2: 98% 95%    -observe today, no changes   9.Diabetes mellitus of peripheral neuropathy.Latest hemoglobin A1c of 14. uncontrolled   -NovoLog 6 units 3 times a day, decreased to 3U.   -lantus decreased to 15 units AM  -Lantus 5 U qhs started 10/8, increased to 10 on 10/10,increase to 15 units on 12/10/2016 needs  CBG (last 3)   Recent Labs  12/09/16 1621 12/09/16 2358 12/10/16 0620  GLUCAP 289* 394* 271*     SSI adjusted  Diabetic coordinator consulted  10.Hyperlipidemia. Lipitor 11. Neurogenic bladder. Removed foley  -continue voiding trial   Flomax started on 10/2, increased on 10/5  Bethanechol 10 TID started 10/3, increased to 25 on 10/4, increased to 50 on 10/8--still with large bladder volumes, change cath scheduled to Q 6 hours 12.Medical noncompliance. Provide counseling 13. AKI  Cr 1.54 on 10/8---down to 1.36 today  Encourage fluids  Continue same medications and follow serially  -recheck monday    14. Sleep  disturbance  PRN Trazodone started 10/1 15. Cognition  Appreciate SLP eval 16. Leukocytosis: Resolved   17. ABLA  Hb 10.4 on 10/8 and 9.9 today  -recheck Monday 18. Acute lower UTI   Empiric Macrobid started on 10/10--continue for 7 days course  Urine culture with greater than 100,000 e coli, pan-sensitive   LOS (Days) 18 A FACE TO FACE EVALUATION WAS PERFORMED  Erick Colace, MD 12/10/2016 11:30 AM

## 2016-12-11 ENCOUNTER — Inpatient Hospital Stay (HOSPITAL_COMMUNITY): Payer: Self-pay | Admitting: Occupational Therapy

## 2016-12-11 ENCOUNTER — Inpatient Hospital Stay (HOSPITAL_COMMUNITY): Payer: Self-pay | Admitting: Physical Therapy

## 2016-12-11 LAB — GLUCOSE, CAPILLARY
GLUCOSE-CAPILLARY: 319 mg/dL — AB (ref 65–99)
GLUCOSE-CAPILLARY: 275 mg/dL — AB (ref 65–99)
GLUCOSE-CAPILLARY: 346 mg/dL — AB (ref 65–99)
Glucose-Capillary: 269 mg/dL — ABNORMAL HIGH (ref 65–99)
Glucose-Capillary: 235 mg/dL — ABNORMAL HIGH (ref 65–99)

## 2016-12-11 MED ORDER — HYDRALAZINE HCL 50 MG PO TABS
50.0000 mg | ORAL_TABLET | Freq: Three times a day (TID) | ORAL | Status: DC
  Administered 2016-12-11 – 2016-12-12 (×3): 50 mg via ORAL
  Filled 2016-12-11 (×3): qty 1

## 2016-12-11 NOTE — Progress Notes (Signed)
Pt bladder scan at 0900 for 575cc with a urine output of 500cc with no I&O cath required. Pt sat on toilet this afternoon with a urine output volume of 300cc with PVR scan of 41cc. Pt required no I&O cath throughout shift. RN educated on voiding trials and attempts. Pt did take a total of 20-30 mins with each void to empty bladder. RN to contiune to educate on bladder management.

## 2016-12-11 NOTE — Progress Notes (Signed)
PHYSICAL MEDICINE & REHABILITATION     PROGRESS NOTE    Subjective/Complaints: Pt seen sitting at EOB this AM.  She slept well overnight.  She states she had an uneventful weekend.  She is concerned about her discharge disposition.   Review of Systems  Respiratory: Negative for sputum production and shortness of breath.   Gastrointestinal: Negative for abdominal pain and diarrhea.  Musculoskeletal: Negative for myalgias.  Skin: Negative for itching.  Neurological: Positive for sensory change and focal weakness.  Psychiatric/Behavioral: Negative for depression and suicidal ideas.    Objective: Vital Signs: Blood pressure (!) 180/77, pulse 75, temperature 98.4 F (36.9 C), temperature source Oral, resp. rate 18, height 5\' 4"  (1.626 m), weight 76.2 kg (168 lb 0.8 oz), SpO2 99 %. No results found. No results for input(s): WBC, HGB, HCT, PLT in the last 72 hours. No results for input(s): NA, K, CL, GLUCOSE, BUN, CREATININE, CALCIUM in the last 72 hours.  Invalid input(s): CO CBG (last 3)   Recent Labs  12/10/16 1630 12/10/16 2026 12/11/16 0628  GLUCAP 262* 322* 319*    Wt Readings from Last 3 Encounters:  12/06/16 76.2 kg (168 lb 0.8 oz)  11/18/16 76.2 kg (167 lb 15.9 oz)  11/07/16 68.4 kg (150 lb 12.8 oz)    Physical Exam:  Constitutional: She appears well-developed and well-nourished.  HENT: Normocephalic and atraumatic.  Eyes: EOM are normal. No discharge.  Cardiovascular: RRR. No JVD        Respiratory: CTA Bilaterally. Normal effort   GI: BS+, Non-distended  Musculoskeletal: No tenderness, no edema in extremities. Pain with hip and knee joint range of motion.  Neurological: She is alert and oriented.  Motor: B/l UE 5/5 proximal to distal RLE: HF 3+/5, KE 3+/5, ADF/PF 4+/5  LLE: HF, KE 4-/5, ADF/PF 4+/5  Skin: Skin is warm and dry.  Psychiatric: Flat  Assessment/Plan: 1. Functional and neurological deficits secondary to multiple sclerosis which  require 3+ hours per day of interdisciplinary therapy in a comprehensive inpatient rehab setting. Physiatrist is providing close team supervision and 24 hour management of active medical problems listed below. Physiatrist and rehab team continue to assess barriers to discharge/monitor patient progress toward functional and medical goals.  Function:  Bathing Bathing position   Position: Sitting EOB  Bathing parts Body parts bathed by patient: Right arm, Left arm, Chest, Abdomen, Front perineal area, Buttocks, Right upper leg, Left upper leg, Right lower leg, Left lower leg Body parts bathed by helper: Back  Bathing assist Assist Level: Set up      Upper Body Dressing/Undressing Upper body dressing   What is the patient wearing?: Pull over shirt/dress Bra - Perfomed by patient: Thread/unthread right bra strap, Thread/unthread left bra strap, Hook/unhook bra (pull down sports bra)   Pull over shirt/dress - Perfomed by patient: Thread/unthread right sleeve, Thread/unthread left sleeve, Put head through opening, Pull shirt over trunk          Upper body assist Assist Level: Set up   Set up : To obtain clothing/put away  Lower Body Dressing/Undressing Lower body dressing   What is the patient wearing?: Pants, Ted Hose, Non-skid slipper socks   Underwear - Performed by helper: Thread/unthread right underwear leg, Thread/unthread left underwear leg Pants- Performed by patient: Thread/unthread right pants leg, Thread/unthread left pants leg, Pull pants up/down Pants- Performed by helper: Thread/unthread right pants leg, Thread/unthread left pants leg, Pull pants up/down Non-skid slipper socks- Performed by patient: Don/doff right sock, Don/doff  left sock Non-skid slipper socks- Performed by helper: Don/doff right sock, Don/doff left sock     Shoes - Performed by patient: Don/doff right shoe, Don/doff left shoe Shoes - Performed by helper: Don/doff right shoe     TED Hose - Performed  by patient: Don/doff right TED hose, Don/doff left TED hose TED Hose - Performed by helper: Don/doff right TED hose, Don/doff left TED hose  Lower body assist Assist for lower body dressing: Supervision or verbal cues      Toileting Toileting   Toileting steps completed by patient: Adjust clothing prior to toileting, Performs perineal hygiene Toileting steps completed by helper: Adjust clothing after toileting Toileting Assistive Devices: Grab bar or rail  Toileting assist Assist level: Touching or steadying assistance (Pt.75%)   Transfers Chair/bed transfer   Chair/bed transfer method: Stand pivot Chair/bed transfer assist level: Supervision or verbal cues Chair/bed transfer assistive device: Armrests     Locomotion Ambulation     Max distance: 50 Assist level: Touching or steadying assistance (Pt > 75%)   Wheelchair   Type: Manual Max wheelchair distance: 150 Assist Level: Supervision or verbal cues  Cognition Comprehension Comprehension assist level: Follows complex conversation/direction with extra time/assistive device  Expression Expression assist level: Expresses complex ideas: With extra time/assistive device  Social Interaction Social Interaction assist level: Interacts appropriately with others with medication or extra time (anti-anxiety, antidepressant).  Problem Solving Problem solving assist level: Solves complex problems: With extra time  Memory Memory assist level: More than reasonable amount of time   Medical Problem List and Plan: 1.  Decreased functional mobility with bilateral lower extremity weakness secondary to active demyelinating disease/multiple sclerosis.  Cont CIR. dispo remains an issue  Notes reviewed 2.  DVT Prophylaxis/Anticoagulation: subcutaneous heparin. Monitor platelet counts any signs of bleeding 3. Pain Management/fibromyalgia: Neurontin 600 mg 3 times a day, Voltaren gel, oxycodone as needed for dysesthetic pain  Joint arthralgias, may  be from Lipitor, discontinued. She only started this medicine about a week before admission to hospital. She may follow up with her primary care physician to restart another agent 4. Mood: provide emotional support  -team providing ego suppot 5. Neuropsych: This patient is capable of making decisions on her own behalf. 6. Skin/Wound Care: Routine skin checks 7. Fluids/Electrolytes/Nutrition:   8. Hypertension.  Norvasc 10 mg daily  Lisinopril changed to  BID on 10/2  Increased toprol to  daily on 9/29  Hydralazine 25 TID starting 10/14, increased to 50 on 10/15  Vitals:   12/11/16 0629 12/11/16 0845  BP: (!) 142/79 (!) 180/77  Pulse:  75  Resp:    Temp:    SpO2:     Hypertensive crisis overnight   9.Diabetes mellitus of peripheral neuropathy.Latest hemoglobin A1c of 14. uncontrolled   -NovoLog 6 units 3 times a day, decreased to 3U.   -lantus decreased to 15 units AM  -Lantus 5 U qhs started 10/8, increased to 10 on 10/10,increase to 15 units on 12/10/2016  Remains elevated, will consider further increase tomorrow - has been extremely labile CBG (last 3)   Recent Labs  12/10/16 1630 12/10/16 2026 12/11/16 0628  GLUCAP 262* 322* 319*   SSI adjusted  Diabetic coordinator consulted 10.Hyperlipidemia. Lipitor 11. Neurogenic bladder. Removed foley  -continue voiding trial  Flomax started on 10/2, increased on 10/5  Bethanechol 10 TID started 10/3, increased to 25 on 10/4, increased to 50 on 10/8  Changed cath scheduled to Q 6 hours 12.Medical noncompliance. Provide counseling 13. AKI  Cr 1.36 on 10/12  Encourage fluids  Continue same medications and follow serially 14. Sleep disturbance  PRN Trazodone started 10/1 15. Cognition  Appreciate SLP eval 16. Leukocytosis: Resolved 17. ABLA  Hb 9.9 on 10/12 18. Acute lower UTI   Empiric Macrobid started on 10/10--continue for 7 days course  Urine culture with greater than 100,000 e coli, pan-sensitive   LOS (Days)  19 A FACE TO FACE EVALUATION WAS PERFORMED  Hailie Searight Karis Juba, MD 12/11/2016 10:29 AM

## 2016-12-11 NOTE — Progress Notes (Signed)
Inpatient Diabetes Program Recommendations  AACE/ADA: New Consensus Statement on Inpatient Glycemic Control (2015)  Target Ranges:  Prepandial:   less than 140 mg/dL      Peak postprandial:   less than 180 mg/dL (1-2 hours)      Critically ill patients:  140 - 180 mg/dL   Lab Results  Component Value Date   GLUCAP 275 (H) 12/11/2016   HGBA1C >14.0 10/31/2016    Review of Glycemic Control Results for ALIJA, CANTRELLE (MRN [id number]) as of 12/11/2016 14:08  Ref. Range 12/10/2016 12:10 12/10/2016 16:30 12/10/2016 20:26 12/11/2016 06:28 12/11/2016 12:02  Glucose-Capillary Latest Ref Range: 65 - 99 mg/dL 329 (H) 518 (H) 841 (H) 319 (H) 275 (H)   Inpatient Diabetes Program Recommendations:    Reviewed CBGs. Please consider: -Increase Lantus to 17 units bid -Increase Novolog meal coverage to 5 units tid if eats 50% -Increase Novolog correction to sensitive 0-9 units tid + 0-5 units hs  Thank you, Darel Hong E. Cozetta Seif, RN, MSN, CDE  Diabetes Coordinator Inpatient Glycemic Control Team Team Pager [phone] (8am-5pm) 12/11/2016 2:14 PM

## 2016-12-11 NOTE — Progress Notes (Addendum)
Physical Therapy Note  Patient Details  Name: Debra [name] MRN: [id number] Date of Birth: 1965-05-05 Today's Date: 12/11/2016    Time: 870 094 1401 54 minutes  1:1 Pt c/o pain in bilat hips, meds given prior to session, hot packs applied after session.  Gait throughout unit with supervision with RW on carpet and tile surfaces.  Gait with obstacle negotiation, side and backward stepping all with supervision with RW.  Standing balance with ball toss without UE support with supervision 3 x 2 minutes.  Seated core strengthening with basketball for trunk diagonals 2 x 10.  Pt limited by LE pain but improving independence with mobility.   Time 2: 1345-1415 30 minutes  1:1 Pt still with c/o hip pain, meds given prior to session.  Pt performs gait 75' x 2 with supervision.  Standing balance with fine motor activity for UE with supervision for standing balance.  Standing tap ups with 1 UE support 2 x 15 with min A for balance.  Pt left on toilet with RN present.  [name],[name] 12/11/2016, 10:24 AM

## 2016-12-11 NOTE — Progress Notes (Signed)
Occupational Therapy Session Note  Patient Details  Name: Debra [name] MRN: [id number] Date of Birth: 04-02-1965  Today's Date: 12/11/2016 OT Individual Time: 8177-1165 and 7903-8333 OT Individual Time Calculation (min): 55 min and 42 min   Short Term Goals: Week 3:  OT Short Term Goal 1 (Week 3): STG = LTGs of supervision overall  Skilled Therapeutic Interventions/Progress Updates:    1) Treatment session with focus on increased independence with self-care tasks.  Pt received seated EOB reporting not feeling well but willing to engage in self-care tasks.  Completed bathing and dressing at sit > stand level from EOB after setup for items.  Supervision for standing balance when washing buttocks and standing to pull pants over hips.  Pt required increased time when donning TEDS and gripper socks.  Ambulated around room to clean up supplies and place dirty clothes in bag with supervision when ambulating with RW.  Grooming tasks completed in standing at sink with supervision for standing balance.  Returned to bed with increased time to lift RLE into bed.  Left with all needs in reach.  2) Treatment session with focus on BUE strengthening and coordination.  Pt had reported increased difficulty with donning TEDS this AM, therefore engaged in strengthening activities with theraputty to increase intrinsic muscles and grip strength.  Pt completed grip and pinch strength activities with theraputty as well as removal of beads for precise fine motor control and pinch.  Encouraged pt to complete these exercises 2x/day to increase strength and independence with self-care tasks.  Therapy Documentation Precautions:  Precautions Precautions: Fall Restrictions Weight Bearing Restrictions: No General:   Vital Signs: Therapy Vitals BP: (!) 142/79 Pain: Pain Assessment Pain Assessment: 0-10 Pain Score: 7  Pain Type: Acute pain Pain Location: Hip Pain Orientation: Right;Left Pain Radiating Towards:  legs Pain Descriptors / Indicators: Aching;Sharp;Discomfort Pain Frequency: Intermittent Pain Onset: On-going Patients Stated Pain Goal: 4 Pain Intervention(s): Medication (See eMAR)  See Function Navigator for Current Functional Status.   Therapy/Group: Individual Therapy  Rosalio Loud 12/11/2016, 8:32 AM

## 2016-12-12 ENCOUNTER — Inpatient Hospital Stay (HOSPITAL_COMMUNITY): Payer: Self-pay | Admitting: Occupational Therapy

## 2016-12-12 ENCOUNTER — Inpatient Hospital Stay (HOSPITAL_COMMUNITY): Payer: Self-pay

## 2016-12-12 ENCOUNTER — Inpatient Hospital Stay (HOSPITAL_COMMUNITY): Payer: Self-pay | Admitting: Physical Therapy

## 2016-12-12 DIAGNOSIS — M25562 Pain in left knee: Secondary | ICD-10-CM

## 2016-12-12 DIAGNOSIS — I10 Essential (primary) hypertension: Secondary | ICD-10-CM

## 2016-12-12 LAB — GLUCOSE, CAPILLARY
GLUCOSE-CAPILLARY: 302 mg/dL — AB (ref 65–99)
GLUCOSE-CAPILLARY: 245 mg/dL — AB (ref 65–99)
Glucose-Capillary: 285 mg/dL — ABNORMAL HIGH (ref 65–99)
Glucose-Capillary: 317 mg/dL — ABNORMAL HIGH (ref 65–99)
Glucose-Capillary: 363 mg/dL — ABNORMAL HIGH (ref 65–99)

## 2016-12-12 MED ORDER — HYDRALAZINE HCL 50 MG PO TABS
75.0000 mg | ORAL_TABLET | Freq: Three times a day (TID) | ORAL | Status: DC
  Administered 2016-12-12 – 2016-12-15 (×8): 75 mg via ORAL
  Filled 2016-12-12 (×10): qty 1

## 2016-12-12 NOTE — Progress Notes (Signed)
Physical Therapy Session Note  Patient Details  Name: BASYA GRUENBERG MRN: [id number] Date of Birth: 08-30-1965  Today's Date: 12/12/2016 PT Individual Time: 0900-0951 PT Individual Time Calculation (min): 51 min    Skilled Therapeutic Interventions/Progress Updates:    pt c/o pain 6/10  and was very lethargic but agreeable to therapy. Pt was able to ambulate 50+ft Min A with RW. Pt performed standing balance with out RW 3 x 30 sec, standing balance with weight shifts 3 x 30 sec, standing balance with diagonal reaches, min A with table for guard and support. Pt reported pain during reaches due to lumbar flexion. Attempted standing balance on foam, but increased patients pain to 7/10. Pt ambulated with out RW mod A 10 ft x 6. Attempted standing marches but complained of pain. Performed seated balance exercises with ball toss, pt complained of pain with trunk rotation. 2x10 seated quad extensions with 1.5 lbs weight in attempts to strengthen quads to decrease pain with sit to stand transfers. . Pt complained that R knee felt loose and painful. For cardio and UE strength Pt self propelled in WC back to room . Pt was left on toilet with nurse aware. Nurse was also made aware of all pain complaints.    Therapy/Group: Individual Therapy  Yuvonne Lanahan 12/12/2016, 12:02 PM

## 2016-12-12 NOTE — Progress Notes (Signed)
Physical Therapy Note  Patient Details  Name: Debra [name] MRN: [id number] Date of Birth: 10-Jun-1965 Today's Date: 12/12/2016    Time: 1300-1320 20 minutes  1:1 pt continues with c/o 7/10 pain in bilat hips, RN aware, agreeable to treatment.  Gait with RW x 100' with supervision. Pt then with c/o feeling "like my vision is getting dark, dizzy and sweaty".  Blood glucose 317, BP 144/64.  RN states this is a low BP for patient. Pt states she does not want to continue therapy but is willing to sit up in recliner. Pt transfers to recliner with supervision, left with needs at hand.   Sheza Strickland 12/12/2016, 1:42 PM

## 2016-12-12 NOTE — Progress Notes (Signed)
Occupational Therapy Session Note  Patient Details  Name: [name] MRN: [id number] Date of Birth: 1965/11/13  Today's Date: 12/12/2016 OT Individual Time: 1100-1130 OT Individual Time Calculation (min): 30 min    Short Term Goals: Week 2:  OT Short Term Goal 1 (Week 2): STG = LTGs OT Short Term Goal 1 - Progress (Week 2): Progressing toward goal Week 3:  OT Short Term Goal 1 (Week 3): STG = LTGs of supervision overall  Skilled Therapeutic Interventions/Progress Updates:    1:! Pt sitting EOB with c/o pain in Lower back and in bilateral hips. Positioned self in prone for stretching ofr ~3 min. Transitioned to supine on back with more than reasonable amt of time. LEs propped up on large therapy ball and able to get some pain relief. Pt perform hip adduction exercises and hip flexion (pulling LEs up to chest and then side to side). Left pt with Les propped up on ball for continued relief.  Pt reports pain 3/10 at end of session - improved from 7  Therapy Documentation Precautions:  Precautions Precautions: Fall Restrictions Weight Bearing Restrictions: No General: General PT Missed Treatment Reason: Patient ill (Comment) (low BP) Vital Signs: Therapy Vitals Temp: (!) 97.5 F (36.4 C) Temp Source: Oral Pulse Rate: 69 BP: 139/66 Oxygen Therapy SpO2: 97 % Pain:  pt with 7/10 bilateral hip and lower back pain- repositioning and stretching See Function Navigator for Current Functional Status.   Therapy/Group: Individual Therapy  Roney Mans [facility] 12/12/2016, 3:27 PM

## 2016-12-12 NOTE — Progress Notes (Signed)
[facility] PHYSICAL MEDICINE & REHABILITATION     PROGRESS NOTE    Subjective/Complaints: Pt seen ambulating from restroom this AM with rolling walker.  She did not sleep well overnight due to pain.    Review of Systems  Respiratory: Negative for sputum production and shortness of breath.   Gastrointestinal: Negative for abdominal pain and diarrhea.  Musculoskeletal: Positive for back pain, joint pain and myalgias.  Skin: Negative for itching.  Neurological: Positive for sensory change and focal weakness.  Psychiatric/Behavioral: Negative for depression and suicidal ideas.    Objective: Vital Signs: Blood pressure (!) 188/81, pulse 73, temperature 98 F (36.7 C), temperature source Oral, resp. rate 19, height 5\' 4"  (1.626 m), weight 76.2 kg (168 lb 0.8 oz), SpO2 100 %. No results found. No results for input(s): WBC, HGB, HCT, PLT in the last 72 hours. No results for input(s): NA, K, CL, GLUCOSE, BUN, CREATININE, CALCIUM in the last 72 hours.  Invalid input(s): CO CBG (last 3)   Recent Labs  12/11/16 1659 12/11/16 2054 12/12/16 0619  GLUCAP 235* 346* 302*    Wt Readings from Last 3 Encounters:  12/06/16 76.2 kg (168 lb 0.8 oz)  11/18/16 76.2 kg (167 lb 15.9 oz)  11/07/16 68.4 kg (150 lb 12.8 oz)    Physical Exam:  Constitutional: She appears well-developed and well-nourished.  HENT: Normocephalic and atraumatic.  Eyes: EOM are normal. No discharge.  Cardiovascular: RRR. No JVD        Respiratory: CTA Bilaterally. Normal effort   GI: BS+, Non-distended  Musculoskeletal: No tenderness, no edema in extremities. Pain with hip and knee joint range of motion.  Neurological: She is alert and oriented.  Motor: B/l UE 5/5 proximal to distal RLE: HF 3+/5, KE 3+/5, ADF/PF 4+/5 (stable) LLE: HF, KE 4-/5, ADF/PF 4+/5  Skin: Skin is warm and dry.  Psychiatric: Flat  Assessment/Plan: 1. Functional and neurological deficits secondary to multiple sclerosis which require 3+  hours per day of interdisciplinary therapy in a comprehensive inpatient rehab setting. Physiatrist is providing close team supervision and 24 hour management of active medical problems listed below. Physiatrist and rehab team continue to assess barriers to discharge/monitor patient progress toward functional and medical goals.  Function:  Bathing Bathing position   Position: Sitting EOB  Bathing parts Body parts bathed by patient: Right arm, Left arm, Chest, Abdomen, Front perineal area, Buttocks, Right upper leg, Left upper leg, Right lower leg, Left lower leg Body parts bathed by helper: Back  Bathing assist Assist Level: Set up      Upper Body Dressing/Undressing Upper body dressing   What is the patient wearing?: Pull over shirt/dress Bra - Perfomed by patient: Thread/unthread right bra strap, Thread/unthread left bra strap, Hook/unhook bra (pull down sports bra)   Pull over shirt/dress - Perfomed by patient: Thread/unthread right sleeve, Thread/unthread left sleeve, Put head through opening, Pull shirt over trunk          Upper body assist Assist Level: Set up   Set up : To obtain clothing/put away  Lower Body Dressing/Undressing Lower body dressing   What is the patient wearing?: Pants, Ted Hose, Non-skid slipper socks   Underwear - Performed by helper: Thread/unthread right underwear leg, Thread/unthread left underwear leg Pants- Performed by patient: Thread/unthread right pants leg, Thread/unthread left pants leg, Pull pants up/down Pants- Performed by helper: Thread/unthread right pants leg, Thread/unthread left pants leg, Pull pants up/down Non-skid slipper socks- Performed by patient: Don/doff right sock, Don/doff left sock  Non-skid slipper socks- Performed by helper: Don/doff right sock, Don/doff left sock     Shoes - Performed by patient: Don/doff right shoe, Don/doff left shoe Shoes - Performed by helper: Don/doff right shoe     TED Hose - Performed by patient:  Don/doff right TED hose, Don/doff left TED hose TED Hose - Performed by helper: Don/doff right TED hose, Don/doff left TED hose  Lower body assist Assist for lower body dressing: Supervision or verbal cues      Toileting Toileting   Toileting steps completed by patient: Performs perineal hygiene, Adjust clothing prior to toileting, Adjust clothing after toileting Toileting steps completed by helper: Adjust clothing after toileting Toileting Assistive Devices: Grab bar or rail (walker)  Toileting assist Assist level: More than reasonable time   Transfers Chair/bed transfer   Chair/bed transfer method: Stand pivot Chair/bed transfer assist level: Supervision or verbal cues Chair/bed transfer assistive device: Armrests     Locomotion Ambulation     Max distance: 50 Assist level: Touching or steadying assistance (Pt > 75%)   Wheelchair   Type: Manual Max wheelchair distance: 150 Assist Level: Supervision or verbal cues  Cognition Comprehension Comprehension assist level: Follows complex conversation/direction with extra time/assistive device  Expression Expression assist level: Expresses complex ideas: With extra time/assistive device  Social Interaction Social Interaction assist level: Interacts appropriately with others with medication or extra time (anti-anxiety, antidepressant).  Problem Solving Problem solving assist level: Solves complex problems: With extra time  Memory Memory assist level: More than reasonable amount of time   Medical Problem List and Plan: 1.  Decreased functional mobility with bilateral lower extremity weakness secondary to active demyelinating disease/multiple sclerosis.  Cont CIR. dispo remains an issue 2.  DVT Prophylaxis/Anticoagulation: subcutaneous heparin. Monitor platelet counts any signs of bleeding 3. Pain Management/fibromyalgia: Neurontin 600 mg 3 times a day, Voltaren gel, oxycodone as needed for dysesthetic pain  Joint arthralgias, may be  from Lipitor, discontinued. She only started this medicine about a week before admission to hospital. She may follow up with her primary care physician to restart another agent  Autoimmune labs ordered, dicussed with lab  Will consider consult to Rheum   Xray of left knee ordered 4. Mood: provide emotional support  -team providing ego suppot 5. Neuropsych: This patient is capable of making decisions on her own behalf. 6. Skin/Wound Care: Routine skin checks 7. Fluids/Electrolytes/Nutrition:   8. Hypertension.  Norvasc 10 mg daily  Lisinopril changed to  BID on 10/2  Increased toprol to  daily on 9/29  Hydralazine 25 TID starting 10/14, increased to 50 on 10/15, increased to 75 on 10/16  Renal artery ultrasound ordered Vitals:   12/12/16 0600 12/12/16 0624  BP: (!) 188/81 (!) 188/81  Pulse:    Resp: 19   Temp: 98 F (36.7 C)   SpO2: 100%    Hypertensive crisis overnight 10/16   9.Diabetes mellitus of peripheral neuropathy.Latest hemoglobin A1c of 14. uncontrolled   -NovoLog decreased to 3U with meals.   -lantus decreased to 15 units AM  -Lantus 5 U qhs started 10/8, increased to 10 on 10/10,increase to 15 units on 12/10/2016  Will increase again tomorrow, has been very sensitive to changes at times CBG (last 3)   Recent Labs  12/11/16 1659 12/11/16 2054 12/12/16 0619  GLUCAP 235* 346* 302*   SSI adjusted  Diabetic coordinator consulted 10.Hyperlipidemia. Lipitor 11. Neurogenic bladder. Removed foley  -continue voiding trial  Flomax started on 10/2, increased on 10/5  Bethanechol  10 TID started 10/3, increased to 25 on 10/4, increased to 50 on 10/8  Changed cath scheduled to Q 6 hours 12.Medical noncompliance. Provide counseling 13. AKI  Cr 1.36 on 10/12  Encourage fluids  Continue same medications and follow serially 14. Sleep disturbance  PRN Trazodone started 10/1 15. Cognition  Appreciate SLP eval 16. Leukocytosis: Resolved 17. ABLA  Hb 9.9 on  10/12 18. Acute lower UTI   Empiric Macrobid started on 10/10--continue for 7 days course  Urine culture with greater than 100,000 e coli, pan-sensitive   LOS (Days) 20 A FACE TO FACE EVALUATION WAS PERFORMED  Ankit Karis Juba, MD 12/12/2016 9:55 AM

## 2016-12-12 NOTE — Progress Notes (Signed)
Occupational Therapy Session Note  Patient Details  Name: DAYANNARA COOKSEY MRN: [id number] Date of Birth: [birth year]  Today's Date: 12/12/2016 OT Individual Time: 0702-0757 OT Individual Time Calculation (min): 55 min    Short Term Goals: Week 3:  OT Short Term Goal 1 (Week 3): STG = LTGs of supervision overall  Skilled Therapeutic Interventions/Progress Updates:    Treatment session with focus on functional mobility and d/c planning.  Pt received upright in w/c reporting "too early".  Pt finished breakfast while engaging in discussion regarding upcoming d/c.  Pt fixated on questions regarding MS diagnosis and treatment protocol as well as desire to receive disability as pt also with concerns about finances.  When asked about d/c location, pt unable to identify stating that "everyone is full", then returning to perseverating on finances and disability application.  RN arrived to scan bladder, recommending pt attempt toileting. Ambulated to toilet with RW with supervision, pt completed toileting tasks at Mod I level, but unsuccessful to void bladder despite attempting for ~15 mins.  RN notified.  Left upright in w/c with all needs in reach.  Therapy Documentation Precautions:  Precautions Precautions: Fall Restrictions Weight Bearing Restrictions: No General:   Vital Signs: Therapy Vitals Temp: 98 F (36.7 C) Temp Source: Oral Resp: 19 BP: (!) 188/81 Patient Position (if appropriate): Sitting Oxygen Therapy SpO2: 100 % O2 Device: Not Delivered Pain: Pain Assessment Pain Assessment: 0-10 Pain Score: 8  Pain Type: Acute pain Pain Location: Hip (Knees, Arms) Pain Orientation: Right;Left Pain Radiating Towards: knees Pain Descriptors / Indicators: Aching Pain Frequency: Constant Pain Onset: On-going Patients Stated Pain Goal: 4 Pain Intervention(s): Medication (See eMAR) Multiple Pain Sites: Yes  See Function Navigator for Current Functional Status.   Therapy/Group:  Individual Therapy  Rosalio Loud 12/12/2016, 8:20 AM

## 2016-12-12 NOTE — Progress Notes (Signed)
Nutrition Follow-up  DOCUMENTATION CODES:   Not applicable  INTERVENTION:  Continue Boost Breeze po once daily, each supplement provides 250 kcal and 9 grams of protein.  Provide AM and HS snack (Ordered).  Encourage adequate PO intake.   NUTRITION DIAGNOSIS:   Inadequate oral intake related to nausea, poor appetite as evidenced by per patient/family report; improved  GOAL:   Patient will meet greater than or equal to 90% of their needs; met  MONITOR:   PO intake, Supplement acceptance, Weight trends, Labs, I & O's  REASON FOR ASSESSMENT:   Malnutrition Screening Tool    ASSESSMENT:   Pt with PMH of type I DM with neuropathy, HLD, HTN, GERD, DKA presents with thoracic root lesion  Meal completion has been 100%. Pt currently has Boost Breeze ordered with varied intake. RD to continue with current orders and nourishment snacks.   Diet Order:  Diet Carb Modified Fluid consistency: Thin; Room service appropriate? Yes Diet NPO time specified  Skin:   (non pressure wound on buttocks)  Last BM:  10/14  Height:   Ht Readings from Last 1 Encounters:  11/22/16 _0  (1.626 m)    Weight:   Wt Readings from Last 1 Encounters:  12/06/16 168 lb 0.8 oz (76.2 kg)    Ideal Body Weight:  54.5 kg  BMI:  Body mass index is 28.85 kg/m.  Estimated Nutritional Needs:   Kcal:  1800-2000  Protein:  90-115 grams  Fluid:  >/= 1.8 L/d  EDUCATION NEEDS:   Education needs no appropriate at this time  Corrin Parker, MS, RD, LDN Pager # [phone] After hours/ weekend pager # [phone]

## 2016-12-13 ENCOUNTER — Inpatient Hospital Stay (HOSPITAL_COMMUNITY): Payer: Self-pay | Admitting: Occupational Therapy

## 2016-12-13 ENCOUNTER — Inpatient Hospital Stay (HOSPITAL_COMMUNITY): Payer: Self-pay

## 2016-12-13 DIAGNOSIS — I10 Essential (primary) hypertension: Secondary | ICD-10-CM

## 2016-12-13 LAB — COMPREHENSIVE METABOLIC PANEL
ALK PHOS: 122 U/L (ref 38–126)
ALT: 24 U/L (ref 14–54)
AST: 14 U/L — AB (ref 15–41)
Albumin: 2.8 g/dL — ABNORMAL LOW (ref 3.5–5.0)
Anion gap: 7 (ref 5–15)
BILIRUBIN TOTAL: 0.4 mg/dL (ref 0.3–1.2)
BUN: 19 mg/dL (ref 6–20)
CALCIUM: 9 mg/dL (ref 8.9–10.3)
CO2: 26 mmol/L (ref 22–32)
CREATININE: 1.44 mg/dL — AB (ref 0.44–1.00)
Chloride: 101 mmol/L (ref 101–111)
GFR, EST AFRICAN AMERICAN: 49 mL/min — AB (ref >60)
GFR, EST NON AFRICAN AMERICAN: 42 mL/min — AB (ref >60)
Glucose, Bld: 240 mg/dL — ABNORMAL HIGH (ref 65–99)
Potassium: 4.4 mmol/L (ref 3.5–5.1)
SODIUM: 134 mmol/L — AB (ref 135–145)
TOTAL PROTEIN: 6 g/dL — AB (ref 6.5–8.1)

## 2016-12-13 LAB — C-REACTIVE PROTEIN

## 2016-12-13 LAB — GLUCOSE, CAPILLARY
Glucose-Capillary: 281 mg/dL — ABNORMAL HIGH (ref 65–99)
Glucose-Capillary: 224 mg/dL — ABNORMAL HIGH (ref 65–99)
Glucose-Capillary: 174 mg/dL — ABNORMAL HIGH (ref 65–99)

## 2016-12-13 LAB — SEDIMENTATION RATE: SED RATE: 66 mm/h — AB (ref 0–22)

## 2016-12-13 MED ORDER — INSULIN GLARGINE 100 UNIT/ML ~~LOC~~ SOLN
17.0000 [IU] | Freq: Every day | SUBCUTANEOUS | Status: DC
  Administered 2016-12-13 – 2016-12-14 (×2): 17 [IU] via SUBCUTANEOUS
  Filled 2016-12-13 (×3): qty 0.17

## 2016-12-13 MED ORDER — TRAZODONE HCL 50 MG PO TABS
50.0000 mg | ORAL_TABLET | Freq: Every day | ORAL | Status: DC
Start: 2016-12-13 — End: 2016-12-15
  Administered 2016-12-13 – 2016-12-14 (×2): 50 mg via ORAL
  Filled 2016-12-13 (×2): qty 1

## 2016-12-13 MED ORDER — INSULIN GLARGINE 100 UNIT/ML ~~LOC~~ SOLN
17.0000 [IU] | Freq: Every day | SUBCUTANEOUS | Status: DC
  Administered 2016-12-14 – 2016-12-15 (×2): 17 [IU] via SUBCUTANEOUS
  Filled 2016-12-13 (×3): qty 0.17

## 2016-12-13 MED ORDER — INSULIN ASPART 100 UNIT/ML ~~LOC~~ SOLN
5.0000 [IU] | Freq: Three times a day (TID) | SUBCUTANEOUS | Status: DC
  Administered 2016-12-13 – 2016-12-15 (×4): 5 [IU] via SUBCUTANEOUS

## 2016-12-13 NOTE — Progress Notes (Signed)
Physical Therapy Note  Patient Details  Name: JOZLIN SCHUTH MRN: [id number] Date of Birth: 1965/09/27 Today's Date: 12/13/2016  1330-1430, 60 min individual tx Pain: unable to rate; "my hips, hands"  Pt seated EOB, stating she felt nauseated and couldn't participate.  PT encouraged pt to perform fine motor bil hand task, separating into categories and bagging small items on her table, with mod cues for attention to task.  Pt stated she needed to use toilet.  Gait training in room with RW to/from toilet.  Pt continent of B and B during prolonged time on toilet. After hand washing at sink in standing, pt rested, then stood and folded 3 items of clothing on table top in front of her, then transported items 4' to place in 2nd drawer of bureau.  Pt stated she needed to use toilet again.  Pt left sitting on toilet; PT informed Charisse March, Charity fundraiser.   See function navigator for current status.  Xinyi Batton 12/13/2016, 4:27 PM

## 2016-12-13 NOTE — Progress Notes (Signed)
Physical Therapy Session Note  Patient Details  Name: ERMEL VANBEEK MRN: [id number] Date of Birth: 1965/03/31  Today's Date: 12/13/2016 PT Individual Time: 8338-2505 PT Individual Time Calculation (min): 43 min   Short Term Goals: Week 2:  PT Short Term Goal 1 (Week 2): STG=LTG  Skilled Therapeutic Interventions/Progress Updates:    Pt supine in bed upon PT arrival, hesitant to participate in therapy this session reporting she feels nauseous. Pt reporting needing to use the bathroom. Pt transferred from supine to sitting EOB with supervision. Pt ambulated to/from bathroom using RW and supervision, performed all toileting with supervision. Pt ambulated to sink to wash hands, working on dynamic balance without UE support in order to wash hands, with supervision. Pt ambulated from room<>dayroom x 85 ft each way using RW and min assist. Seated in chair in dayroom pt performed therex: 2 x 10 LAQ, 2 x 10 hip flexion and 2 x 10 hip abduction. Pt left seated EOB eating dinner at end of session with needs in reach.   Therapy Documentation Precautions:  Precautions Precautions: Fall Restrictions Weight Bearing Restrictions: No   See Function Navigator for Current Functional Status.   Therapy/Group: Individual Therapy  Cresenciano Genre, PT, DPT 12/13/2016, 4:43 PM

## 2016-12-13 NOTE — Progress Notes (Signed)
[facility]IC[facility]ABILITATION     PROGRESS NOTE    Subjective/Complaints: Patient seen lying in bed this morning. She states she did not sleep well overnight and wants increase in her sleep aid.  Review of Systems  Respiratory: Negative for sputum production and shortness of breath.   Gastrointestinal: Negative for abdominal pain and diarrhea.  Musculoskeletal: Positive for back pain, joint pain and myalgias.  Skin: Negative for itching.  Neurological: Positive for sensory change and focal weakness.  Psychiatric/Behavioral: Negative for depression and suicidal ideas.    Objective: Vital Signs: Blood pressure (!) 157/69, pulse 75, temperature 98.2 F (36.8 C), resp. rate 18, height 5' 4" (1.626 m), weight 76.3 kg (168 lb 4.7 oz), SpO2 97 %. Dg Knee Complete 4 Views Left  Result Date: 12/12/2016 CLINICAL DATA:  Fall several months ago with persistent knee pain, initial encounter EXAM: LEFT KNEE - COMPLETE 4+ VIEW COMPARISON:  None. FINDINGS: Minimal medial joint space narrowing is noted. No acute fracture or dislocation is seen. No joint effusion is noted. IMPRESSION: No acute abnormality seen.  Mild degenerative changes seen. Electronically Signed   By: Inez Catalina M.D.   On: 12/12/2016 15:21   Dg Knee Complete 4 Views Right  Result Date: 12/12/2016 CLINICAL DATA:  51 year old female status post fall 10 weeks ago. Severe bilateral knee pain, unable to walk. Diagnosed with multiple sclerosis last month. EXAM: RIGHT KNEE - COMPLETE 4+ VIEW COMPARISON:  No prior knee series. FINDINGS: No joint effusion identified on the cross-table lateral view. Bone mineralization is within normal limits. Minimal to mild medial compartment joint space loss. Otherwise preserved joint spaces and alignment. No acute osseous abnormality identified. IMPRESSION: Normal for age radiographic appearance of the right knee. Electronically Signed   By: Genevie Ann M.D.   On: 12/12/2016 15:24   No results  for input(s): WBC, HGB, HCT, PLT in the last 72 hours.  Recent Labs  12/13/16 0810  NA 134*  K 4.4  CL 101  GLUCOSE 240*  BUN 19  CREATININE 1.44*  CALCIUM 9.0   CBG (last 3)   Recent Labs  12/12/16 2103 12/13/16 0623 12/13/16 1122  GLUCAP 363* 281* 224*    Wt Readings from Last 3 Encounters:  12/13/16 76.3 kg (168 lb 4.7 oz)  11/18/16 76.2 kg (167 lb 15.9 oz)  11/07/16 68.4 kg (150 lb 12.8 oz)    Physical Exam:  Constitutional: She appears well-developed and well-nourished.  HENT: Normocephalic and atraumatic.  Eyes: EOM are normal. No discharge.  Cardiovascular: RRR. No JVD        Respiratory: CTA Bilaterally. Normal effort   GI: BS+, Non-distended  Musculoskeletal: No tenderness, no edema in extremities. Pain with hip and knee joint range of motion.  Neurological: She is alert and oriented.  Motor: B/l UE 5/5 proximal to distal RLE: HF 3+/5, KE 3+/5, ADF/PF 4+/5 (unchanged) LLE: HF, KE 4-/5, ADF/PF 4+/5  Skin: Skin is warm and dry.  Psychiatric: Flat  Assessment/Plan: 1. Functional and neurological deficits secondary to multiple sclerosis which require 3+ hours per day of interdisciplinary therapy in a comprehensive inpatient rehab setting. Physiatrist is providing close team supervision and 24 hour management of active medical problems listed below. Physiatrist and rehab team continue to assess barriers to discharge/monitor patient progress toward functional and medical goals.  Function:  Bathing Bathing position   Position: Sitting EOB  Bathing parts Body parts bathed by patient: Right arm, Left arm, Chest, Abdomen, Front perineal area, Buttocks,  Right upper leg, Left upper leg, Right lower leg, Left lower leg Body parts bathed by helper: Back  Bathing assist Assist Level: Set up      Upper Body Dressing/Undressing Upper body dressing   What is the patient wearing?: Pull over shirt/dress Bra - Perfomed by patient: Thread/unthread right bra strap,  Thread/unthread left bra strap, Hook/unhook bra (pull down sports bra)   Pull over shirt/dress - Perfomed by patient: Thread/unthread right sleeve, Thread/unthread left sleeve, Put head through opening, Pull shirt over trunk          Upper body assist Assist Level: Set up   Set up : To obtain clothing/put away  Lower Body Dressing/Undressing Lower body dressing   What is the patient wearing?: Pants, Ted Hose, Non-skid slipper socks   Underwear - Performed by helper: Thread/unthread right underwear leg, Thread/unthread left underwear leg Pants- Performed by patient: Thread/unthread right pants leg, Thread/unthread left pants leg, Pull pants up/down Pants- Performed by helper: Thread/unthread right pants leg, Thread/unthread left pants leg, Pull pants up/down Non-skid slipper socks- Performed by patient: Don/doff right sock, Don/doff left sock Non-skid slipper socks- Performed by helper: Don/doff right sock, Don/doff left sock     Shoes - Performed by patient: Don/doff right shoe, Don/doff left shoe Shoes - Performed by helper: Don/doff right shoe     TED Hose - Performed by patient: Don/doff right TED hose, Don/doff left TED hose TED Hose - Performed by helper: Don/doff right TED hose, Don/doff left TED hose  Lower body assist Assist for lower body dressing: Supervision or verbal cues      Toileting Toileting   Toileting steps completed by patient: Performs perineal hygiene, Adjust clothing prior to toileting, Adjust clothing after toileting Toileting steps completed by helper: Adjust clothing after toileting Toileting Assistive Devices: Grab bar or rail (walker)  Toileting assist Assist level: More than reasonable time   Transfers Chair/bed transfer   Chair/bed transfer method: Stand pivot Chair/bed transfer assist level: Supervision or verbal cues Chair/bed transfer assistive device: Armrests     Locomotion Ambulation     Max distance: 50 Assist level: Touching or  steadying assistance (Pt > 75%)   Wheelchair   Type: Manual Max wheelchair distance: 150 Assist Level: Supervision or verbal cues  Cognition Comprehension Comprehension assist level: Follows complex conversation/direction with extra time/assistive device  Expression Expression assist level: Expresses complex ideas: With extra time/assistive device  Social Interaction Social Interaction assist level: Interacts appropriately with others with medication or extra time (anti-anxiety, antidepressant).  Problem Solving Problem solving assist level: Solves complex problems: With extra time  Memory Memory assist level: More than reasonable amount of time   Medical Problem List and Plan: 1.  Decreased functional mobility with bilateral lower extremity weakness secondary to active demyelinating disease/multiple sclerosis.  Cont CIR. dispo remains an issue 2.  DVT Prophylaxis/Anticoagulation: subcutaneous heparin. Monitor platelet counts any signs of bleeding 3. Pain Management/fibromyalgia: Neurontin 600 mg 3 times a day, Voltaren gel, oxycodone as needed for dysesthetic pain  Joint arthralgias, may be from Lipitor, discontinued. She only started this medicine about a week before admission to hospital. She may follow up with her primary care physician to restart another agent  CRP WNL  ESR elevated  Other autoimmune labs pending  Will consider consult to Rheum   Xray of Knees reviewed, left knee with mild degenerative changes 4. Mood: provide emotional support  -team providing ego suppot 5. Neuropsych: This patient is capable of making decisions on her own  behalf. 6. Skin/Wound Care: Routine skin checks 7. Fluids/Electrolytes/Nutrition:   8. Hypertension.  Norvasc 10 mg daily  Lisinopril changed to 2m BID on 10/2  Increased toprol to 768mdaily on 9/29  Hydralazine 25 TID starting 10/14, increased to 50 on 10/15, increased to 75 on 10/16  Renal artery ultrasound unremarkable  stenosis Vitals:   12/12/16 2109 12/13/16 0352  BP: (!) 149/73 (!) 157/69  Pulse: 74 75  Resp:  18  Temp:  98.2 F (36.8 C)  SpO2: 98% 97%   Improving on 10/17   9.Diabetes mellitus of peripheral neuropathy.Latest hemoglobin A1c of 14. uncontrolled   -NovoLog increased to 5U with meals.   -lantus increased to 17 units AM on 10/17  -Lantus 5 U qhs started 10/8, increased to 10 on 10/10,increase to 17 on 12/13/2016  Has been very sensitive to changes at times CBG (last 3)   Recent Labs  12/12/16 2103 12/13/16 0623 12/13/16 1122  GLUCAP 363* 281* 224*   SSI adjusted  Appreciate Diabetic coordinator recommendations 10.Hyperlipidemia. Lipitor 11. Neurogenic bladder. Removed foley  -continue voiding trial  Flomax started on 10/2, increased on 10/5  Bethanechol 10 TID started 10/3, increased to 25 on 10/4, increased to 50 on 10/8  Changed cath scheduled to Q 6 hours 12.Medical noncompliance. Provide counseling 13. AKI  Cr 1.44 on 10/17  Encourage fluids  Continue same medications and follow serially 14. Sleep disturbance  Trazodone scheduled on 10/17 15. Cognition  Appreciate SLP eval 16. Leukocytosis: Resolved 17. ABLA  Hb 9.9 on 10/12 18. Acute lower UTI   Empiric Macrobid started on 10/10--continue for 7 days course  Urine culture with greater than 100,000 e coli, pan-sensitive   LOS (Days) 21 A FACE TO FACE EVALUATION WAS PERFORMED   AnLorie PhenixMD 12/13/2016 1:20 PM

## 2016-12-13 NOTE — Progress Notes (Signed)
*  PRELIMINARY RESULTS* Vascular Ultrasound Renal Artery Duplex has been completed.  Preliminary findings: No evidence of renal artery stenosis.    Farrel Demark, RDMS, RVT  12/13/2016, 9:17 AM

## 2016-12-13 NOTE — Progress Notes (Signed)
Occupational Therapy Session Note  Patient Details  Name: Debra [name] MRN: [id number] Date of Birth: 06-06-1965  Today's Date: 12/13/2016 OT Individual Time: 1478-2956 and 1130-1200 OT Individual Time Calculation (min): 15 min and 30 min and Today's Date: 12/13/2016 OT Missed Time: 45 Minutes Missed Time Reason: Patient unwilling/refused to participate without medical reason;Pain   Short Term Goals: Week 3:  OT Short Term Goal 1 (Week 3): STG = LTGs of supervision overall  Skilled Therapeutic Interventions/Progress Updates:    1) Attempted to engage pt in skilled treatment session, however pt reports pain and nausea.  Due to pt NPO status awaiting kidney scan, unable to provide any medication to ease pain/nausea.  Completed bed mobility and changed clothing into hospital gown for relief.  Pt declined any bed level or EOB activity, requesting to return to supine.  Left in supine with pillows for comfort and all needs in reach.  Will continue to follow as able.  2) Treatment session with focus on BUE control and coordination as well as participation in leisure activity.  Pt with c/o pain in hands and wrists.  Pt reports enjoyment in coloring, therefore provided pt with coloring pages and colored pencils to address finger strength and coordination with coloring while providing leisure activity and coping strategies.  Attempted to engage pt in conversation regarding d/c planning with pt perseverating on various sources of pain and need for disability.  Left seated at EOB coloring with all needs in reach.  Therapy Documentation Precautions:  Precautions Precautions: Fall Restrictions Weight Bearing Restrictions: No General: General OT Amount of Missed Time: 45 Minutes Pain:  Pt with c/o pain in hips, 7/10.  Unable to provide pain meds due to NPO.  Repositioned  See Function Navigator for Current Functional Status.   Therapy/Group: Individual Therapy  Rosalio Loud 12/13/2016, 9:25  AM

## 2016-12-13 NOTE — Progress Notes (Signed)
Afternoon BP 146/76 with HR 66. Pt refused hydralazine 75mg  scheduled. Pt states her BP is to low and feels light headed and does not want it to drop. RN educated pt on medication regimen and importance of BP management.

## 2016-12-13 NOTE — Progress Notes (Signed)
Inpatient Diabetes Program Recommendations  AACE/ADA: New Consensus Statement on Inpatient Glycemic Control (2015)  Target Ranges:  Prepandial:   less than 140 mg/dL      Peak postprandial:   less than 180 mg/dL (1-2 hours)      Critically ill patients:  140 - 180 mg/dL    Review of Glycemic Control  Inpatient Diabetes Program Recommendations:    Saw MD note yesterday patient sensitive to glucose regimen changes. Consider the following slight changes in insulin:  -Increase Lantus to 17 units bid -Increase Novolog meal coverage to 5 units tid if eats 50% -Increase Novolog correction to sensitive 0-9 units tid + 0-5 units hs  Thanks,  Christena Deem RN, MSN, [facility] Inpatient Diabetes Coordinator Team Pager [phone] (8a-5p)

## 2016-12-14 ENCOUNTER — Inpatient Hospital Stay (HOSPITAL_COMMUNITY): Payer: Self-pay | Admitting: Physical Therapy

## 2016-12-14 ENCOUNTER — Inpatient Hospital Stay (HOSPITAL_COMMUNITY): Payer: Self-pay | Admitting: Occupational Therapy

## 2016-12-14 LAB — RHEUMATOID FACTOR

## 2016-12-14 LAB — GLUCOSE, CAPILLARY
GLUCOSE-CAPILLARY: 317 mg/dL — AB (ref 65–99)
GLUCOSE-CAPILLARY: 294 mg/dL — AB (ref 65–99)
GLUCOSE-CAPILLARY: 258 mg/dL — AB (ref 65–99)
GLUCOSE-CAPILLARY: 272 mg/dL — AB (ref 65–99)
Glucose-Capillary: 200 mg/dL — ABNORMAL HIGH (ref 65–99)
Glucose-Capillary: 283 mg/dL — ABNORMAL HIGH (ref 65–99)

## 2016-12-14 LAB — ANTINUCLEAR ANTIBODIES, IFA: ANA Ab, IFA: NEGATIVE

## 2016-12-14 MED ORDER — MELOXICAM 7.5 MG PO TABS
15.0000 mg | ORAL_TABLET | Freq: Every day | ORAL | Status: DC
  Administered 2016-12-14 – 2016-12-15 (×2): 15 mg via ORAL
  Filled 2016-12-14 (×3): qty 2

## 2016-12-14 NOTE — Progress Notes (Addendum)
Physical Therapy Note  Patient Details  Name: Debra [name] MRN: [id number] Date of Birth: 1965/03/05 Today's Date: 12/14/2016    Time: [phone] 69 minutes  1:1 Pt continues with c/o bilat hip and Rt knee pain, meds given prior to session.  Gait throughout unit up to 150' at a time with RW with supervision.  Standing therex 2 x 10 hip abd, ext, HS curl, heel raises.  Standing static balance on foam without UE support with close supervision up to 30 seconds at a time.  Supine with LEs on theraball LTR and hip flex/ext to relieve low back and hip pain.  Pt requires frequent rests but is able to perform all mobility tasks at supervision level. Pt given hot packs for bilat hips at end of session.   Time 2: 1315-1341 26 minutes  1:1 pt with no c/o pain.  Pt being bladder scanned on PT arrival. Pt then needing to urinate. Pt transfers and performs toileting and clothing management with supervision.  Gait in room with RW with supervision.  Pt then missed 15 minutes skilled PT due to waiting to urinate.  Pt then performs gait and sit to stands x 2 in room with supervision.  Pt then states she feels " nauseas and hot".  Pt BP 120/47, pt laid in bed with min A for Rt LE.  RN made aware.  Marian Meneely 12/14/2016, 10:40 AM

## 2016-12-14 NOTE — Progress Notes (Signed)
Patient alert and cooperative, verbalize that she does not feel her basic needs with ambulation, pain and incontinence is being address in order to get her better prepared for discharge. Support and education provided

## 2016-12-14 NOTE — Progress Notes (Signed)
Occupational Therapy Weekly Progress Note  Patient Details  Name: Debra [name] MRN: [id number] Date of Birth: 11-24-1965  Beginning of progress report period: December 07, 2016 End of progress report period: December 14, 2016  Today's Date: 12/14/2016 OT Individual Time: 6644-0347 OT Individual Time Calculation (min): 48 min    Short term goals not set due to estimated length of stay.  Pt is making steady progress towards therapy goals.  Pt currently min guard - supervision with mobility depending on level of pain and fatigue.  Pt participation limited by pain in various locations (hands, hips, knees).  Pt currently supervision with bathing/dressing tasks at sit > stand level.  Pt will continue to require supervision secondary to fluctuations in safety with mobility due to decreased sensation in BLE, weakness, and decreased safety awareness/awareness of deficits.    Patient continues to demonstrate the following deficits: muscle weakness, decreased cardiorespiratoy endurance, unbalanced muscle activation, decreased coordination and decreased motor planning, decreased visual motor skillsand decreased standing balance, decreased postural control and decreased balance strategies and therefore will continue to benefit from skilled OT intervention to enhance overall performance with BADL and Reduce care partner burden.  See Patient's Care Plan for progression toward long term goals.  Patient progressing toward long term goals..  Continue plan of care.  Skilled Therapeutic Interventions/Progress Updates:    Treatment session with focus on increased independence with self-care tasks.  Pt received seated EOB with MD present, discussing current med schedule as well as imaging results.  Pt competed bathing/dressing at sit > stand level from EOB with setup for items.  UB bathing and dressing completed in standing with supervision for standing balance, pt demonstrating improved stance and balance during  task.  Required assist to don Rt TED stocking due to pain/fatigue in hands.  Discussed current goals and concerns regarding d/c plan.  Therapy Documentation Precautions:  Precautions Precautions: Fall Restrictions Weight Bearing Restrictions: No General:   Vital Signs: Therapy Vitals Temp: 98 F (36.7 C) Temp Source: Oral Pulse Rate: 72 Resp: 18 BP: (!) 171/77 Patient Position (if appropriate): Lying Oxygen Therapy SpO2: (!) 9 % O2 Device: Not Delivered Pain:  Pt with c/o pain in hands and hips with mobility, repositioned.  RN aware.  See Function Navigator for Current Functional Status.   Therapy/Group: Individual Therapy  Rosalio Loud 12/14/2016, 8:05 AM

## 2016-12-14 NOTE — Patient Care Conference (Signed)
Inpatient RehabilitationTeam Conference and Plan of Care Update Date: 12/13/2016   Time: 2:30 PM    Patient Name: Debra [name]      Medical Record Number: [id number]  Date of Birth: 10-Mar-1965 Sex: Female         Room/Bed: 4W13C/4W13C-01 Payor Info: Payor: MEDICAID POTENTIAL / Plan: MEDICAID POTENTIAL / Product Type: *No Product type* /    Admitting Diagnosis: B lower EXT Weakness  Admit Date/Time:  11/22/2016  5:33 PM Admission Comments: No comment available   Primary Diagnosis:  <principal problem not specified> Principal Problem: <principal problem not specified>  Patient Active Problem List   Diagnosis Date Noted  . Multiple joint pain   . HTN (hypertension)   . Acute pain of left knee   . Neurogenic bladder   . Acute lower UTI   . Labile blood pressure   . Acute blood loss anemia   . Leukocytosis   . Labile blood glucose   . Hypertensive crisis   . Hypoglycemia   . Poorly controlled type 2 diabetes mellitus with peripheral neuropathy (HCC)   . Uncontrolled hypertension   . Sleep disturbance   . Multiple sclerosis (HCC) 11/22/2016  . Thoracic root lesion 11/22/2016  . MS (multiple sclerosis) (HCC)   . Neuropathic pain   . Uncontrolled type 2 diabetes mellitus with peripheral neuropathy (HCC)   . Urinary retention   . Medically noncompliant   . Leg weakness, bilateral 11/20/2016  . Diabetic ketoacidosis without coma associated with type 1 diabetes mellitus (HCC)   . Leg pain   . History of CVA (cerebrovascular accident)   . Uncontrolled type 1 diabetes mellitus with diabetic peripheral neuropathy (HCC)   . Benign essential HTN   . Hypertensive urgency   . AKI (acute kidney injury) (HCC)   . Fibromyalgia 11/07/2016  . Back pain 10/31/2016  . Stable angina pectoris (HCC)   . Vitamin D deficiency 08/31/2014  . 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 Hyperlipidemia 10/06/2013  . Depression 10/16/2012  . Bilateral knee pain 02/16/2012   . Chronic diarrhea 10/19/2010  . Peripheral neuropathy 08/19/2010  . 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 Hypertension 07/06/2010  . Asthma 07/06/2010  . Migraine 07/06/2010    Expected Discharge Date: Expected Discharge Date:  (SNF)  Team Members Present: Physician leading conference: Dr. Maryla Morrow Social Worker Present: Amada Jupiter, LCSW Nurse Present: Willey Blade, RN PT Present: Wanda Plump, PT OT Present: Rosalio Loud, OT SLP Present: Feliberto Gottron, SLP PPS Coordinator present : Tora Duck, RN, CRRN     Current Status/Progress Goal Weekly Team Focus  Medical   Decreased functional mobility with bilateral lower extremity weakness secondary to active demyelinating disease/multiple sclerosis  Improve mobility, safety, uncontrolled DM/HTN, AKI, urinary retention  See above   Bowel/Bladder   Continent of bowel LBM 12/13/2016, Voiding w/o cath x 48 hrs, continent last Bladder Scan 109 @ 0350   Patient ability to void w/o  I/O cath. Patient is refusing I/O catherization    Assess QS/prn- toileting needs and monitoring of urinary and bowel outpit, PVR > 350 has been within parament x 48 hrs   Swallow/Nutrition/ Hydration             ADL's   supervision/setup bathing and dressing, min guard transfers and short distance ambulation with RW  supervision overall  dynamic standing balance, d/c planning, ADL retraining, IADLs   Mobility   supervision gait and transfers  supervision  activity tolerance, balance   Communication             Safety/Cognition/ Behavioral Observations  Alert oriented x4, No report of falls or injuries, min assistance with transfers  No c/o injuries/falls while remaining on Rehab department  Continue to maintain safety awaremess within structure envrioment and standard of care per protocol   Pain   Verbal discomfort pain constantly, pain discomfort Hips/Legs states Oxycodone is not relieving discomfort of pain rate  pain 5-7/10 before and after medication  pain scale < 3  Continue to address measures to relieve pain I she is  refusing Muscle Rub and Voltrane prn medication, currently prn Q4 hrs     Skin   No skin issues, dryness to Feet  No new c/o of skin issues monitor   No report of skin issues , assess and address -skin     Rehab Goals Patient on target to meet rehab goals: No *See Care Plan and progress notes for long and short-term goals.     Barriers to Discharge  Current Status/Progress Possible Resolutions Date Resolved   Physician    Decreased caregiver support;Medical stability;New diabetic;Incontinence;Neurogenic Bowel & Bladder;Lack of/limited family support;Medication compliance     See above  Therapies, optimize HTN/BP meds, follow labs, bladder meds, improve sleep      Nursing                  PT                    OT                  SLP                SW                Discharge Planning/Teaching Needs:  Pt now has no d/c option and she reports no family or friends who can assist her.   She is homeless and I have discussed possible shelter if she reaches mod ind goals.  No insurance and no applications begun for SSD or MA as financial counseling does not feel appropriate.  This Child psychotherapist is continuing to work on d/c plans.      Team Discussion:  Had incont bowel episode this morning.  Poor participation and limited progress.  Supervision to min assist overall.  amb ~ 100 '.  SW continues to work on d/c plan  Revisions to Treatment Plan:  None    Continued Need for Acute Rehabilitation Level of Care: The patient requires daily medical management by a physician with specialized training in physical medicine and rehabilitation for the following conditions: Daily direction of a multidisciplinary physical rehabilitation program to ensure safe treatment while eliciting the highest outcome that is of practical value to the patient.: Yes Daily medical management of patient  stability for increased activity during participation in an intensive rehabilitation regime.: Yes Daily analysis of laboratory values and/or radiology reports with any subsequent need for medication adjustment of medical intervention for : Neurological problems;Diabetes problems;Blood pressure problems;Renal problems;Urological problems  Echo Allsbrook 12/14/2016, 1:35 PM

## 2016-12-14 NOTE — Progress Notes (Signed)
Social Work Patient ID: Debra [name], female   DOB: 1965/04/09, 51 y.o.   MRN: [id number]   Have reviewed team conference with pt.  She is aware that team continues to recommend supervision - min assist LOC at d/c given current safety concerns and functional levels.  She is homeless and does not have a family member or friend who can offer housing support. She continues to be very flat and appears apathetic to her situation.  I am continuing to discuss case with [facility] Financial Counseling dept, Social Work dept and have reached out to Case Facilities manager as well.  This is a difficult case as she is most appropriate for RH/ SNF placement, however, has no payor source.  Will keep team posted.   Khaleb Broz, LCSW

## 2016-12-14 NOTE — Progress Notes (Signed)
Occupational Therapy Session Note  Patient Details  Name: Debra [name] MRN: [id number] Date of Birth: 08/10/1965  Today's Date: 12/14/2016 OT Individual Time: 7121-9758 OT Individual Time Calculation (min): 30 min    Short Term Goals: Week 1:  OT Short Term Goal 1 (Week 1): STG = LTGs due to ELOS  Skilled Therapeutic Interventions/Progress Updates:    OT treatment session focused on community reintegration and UE there-ex. UB strengthening/coordination with wc propulsion. OT educated pt on community safety awareness from wc level, including safe elevator access. Pt propelled on uneven surfaces outside but fatigued quickly. OT assisted with wc propulsion back to room for time management. Pt left seated in wc with needs met.   Therapy Documentation Precautions:  Precautions Precautions: Fall Restrictions Weight Bearing Restrictions: No Pain: Pain Assessment Pain Assessment: 0-10 Pain Score: 5  Pain Type: Acute pain Pain Location: Leg Pain Orientation: Right Pain Descriptors / Indicators: Aching;Sharp Pain Frequency: Constant Pain Onset: On-going Pain Intervention(s): Repositioned  See Function Navigator for Current Functional Status.   Therapy/Group: Individual Therapy  Valma Cava 12/14/2016, 3:39 PM

## 2016-12-14 NOTE — Progress Notes (Signed)
[facility]IT[facility]     PROGRESS NOTE    Subjective/Complaints: Pt seen sitting at EOB this AM.  She slept fairly overnight. She has questions about results of ultrasound.  She continues to complain of hip pain.   Review of Systems  Respiratory: Negative for sputum production and shortness of breath.   Gastrointestinal: Negative for abdominal pain and diarrhea.  Musculoskeletal: Positive for back pain, joint pain and myalgias.  Skin: Negative for itching.  Neurological: Positive for sensory change and focal weakness.  Psychiatric/Behavioral: Negative for depression and suicidal ideas.    Objective: Vital Signs: Blood pressure (!) 153/67, pulse 74, temperature 98 F (36.7 C), temperature source Oral, resp. rate 18, height '5\' 4"'$  (1.626 m), weight 76.3 kg (168 lb 4.7 oz), SpO2 (!) 9 %. Dg Knee Complete 4 Views Left  Result Date: 12/12/2016 CLINICAL DATA:  Fall several months ago with persistent knee pain, initial encounter EXAM: LEFT KNEE - COMPLETE 4+ VIEW COMPARISON:  None. FINDINGS: Minimal medial joint space narrowing is noted. No acute fracture or dislocation is seen. No joint effusion is noted. IMPRESSION: No acute abnormality seen.  Mild degenerative changes seen. Electronically Signed   By: Inez Catalina M.D.   On: 12/12/2016 15:21   Dg Knee Complete 4 Views Right  Result Date: 12/12/2016 CLINICAL DATA:  51 year old female status post fall 10 weeks ago. Severe bilateral knee pain, unable to walk. Diagnosed with multiple sclerosis last month. EXAM: RIGHT KNEE - COMPLETE 4+ VIEW COMPARISON:  No prior knee series. FINDINGS: No joint effusion identified on the cross-table lateral view. Bone mineralization is within normal limits. Minimal to mild medial compartment joint space loss. Otherwise preserved joint spaces and alignment. No acute osseous abnormality identified. IMPRESSION: Normal for age radiographic appearance of the right knee. Electronically Signed    By: Genevie Ann M.D.   On: 12/12/2016 15:24   No results for input(s): WBC, HGB, HCT, PLT in the last 72 hours.  Recent Labs  12/13/16 0810  NA 134*  K 4.4  CL 101  GLUCOSE 240*  BUN 19  CREATININE 1.44*  CALCIUM 9.0   CBG (last 3)   Recent Labs  12/13/16 1705 12/13/16 2049 12/14/16 0642  GLUCAP 174* 200* 317*    Wt Readings from Last 3 Encounters:  12/13/16 76.3 kg (168 lb 4.7 oz)  11/18/16 76.2 kg (167 lb 15.9 oz)  11/07/16 68.4 kg (150 lb 12.8 oz)    Physical Exam:  Constitutional: She appears well-developed and well-nourished.  HENT: Normocephalic and atraumatic.  Eyes: EOM are normal. No discharge.  Cardiovascular: RRR. No JVD        Respiratory: CTA Bilaterally. Normal effort   GI: BS+, Non-distended  Musculoskeletal: No tenderness, no edema in extremities. Pain with hip and knee joint range of motion.  Neurological: She is alert and oriented.  Motor: B/l UE 5/5 proximal to distal RLE: HF 3+/5, KE 3+/5, ADF/PF 4+/5 (stable) LLE: HF, KE 4-/5, ADF/PF 4+/5 (stable) Skin: Skin is warm and dry.  Psychiatric: Flat  Assessment/Plan: 1. Functional and neurological deficits secondary to multiple sclerosis which require 3+ hours per day of interdisciplinary therapy in a comprehensive inpatient rehab setting. Physiatrist is providing close team supervision and 24 hour management of active medical problems listed below. Physiatrist and rehab team continue to assess barriers to discharge/monitor patient progress toward functional and medical goals.  Function:  Bathing Bathing position   Position: Sitting EOB  Bathing parts Body parts bathed by patient:  Right arm, Left arm, Chest, Abdomen, Front perineal area, Buttocks, Right upper leg, Left upper leg, Right lower leg, Left lower leg Body parts bathed by helper: Back  Bathing assist Assist Level: Set up      Upper Body Dressing/Undressing Upper body dressing   What is the patient wearing?: Pull over shirt/dress Bra  - Perfomed by patient: Thread/unthread right bra strap, Thread/unthread left bra strap, Hook/unhook bra (pull down sports bra)   Pull over shirt/dress - Perfomed by patient: Thread/unthread right sleeve, Thread/unthread left sleeve, Put head through opening, Pull shirt over trunk          Upper body assist Assist Level: Set up   Set up : To obtain clothing/put away  Lower Body Dressing/Undressing Lower body dressing   What is the patient wearing?: Pants, Ted Hose, Non-skid slipper socks   Underwear - Performed by helper: Thread/unthread right underwear leg, Thread/unthread left underwear leg Pants- Performed by patient: Thread/unthread right pants leg, Thread/unthread left pants leg, Pull pants up/down Pants- Performed by helper: Thread/unthread right pants leg, Thread/unthread left pants leg, Pull pants up/down Non-skid slipper socks- Performed by patient: Don/doff right sock, Don/doff left sock Non-skid slipper socks- Performed by helper: Don/doff right sock, Don/doff left sock     Shoes - Performed by patient: Don/doff right shoe, Don/doff left shoe Shoes - Performed by helper: Don/doff right shoe     TED Hose - Performed by patient: Don/doff left TED hose TED Hose - Performed by helper: Don/doff right TED hose  Lower body assist Assist for lower body dressing: Supervision or verbal cues      Toileting Toileting   Toileting steps completed by patient: Adjust clothing prior to toileting, Performs perineal hygiene, Adjust clothing after toileting Toileting steps completed by helper: Adjust clothing after toileting Toileting Assistive Devices: Grab bar or rail  Toileting assist Assist level: More than reasonable time   Transfers Chair/bed transfer   Chair/bed transfer method: Stand pivot Chair/bed transfer assist level: Supervision or verbal cues Chair/bed transfer assistive device: Medical sales representative     Max distance: 85 ft Assist level: Touching or  steadying assistance (Pt > 75%)   Wheelchair   Type: Manual Max wheelchair distance: 150 Assist Level: Supervision or verbal cues  Cognition Comprehension Comprehension assist level: Follows complex conversation/direction with extra time/assistive device  Expression Expression assist level: Expresses complex ideas: With extra time/assistive device  Social Interaction Social Interaction assist level: Interacts appropriately with others with medication or extra time (anti-anxiety, antidepressant).  Problem Solving Problem solving assist level: Solves basic problems with no assist  Memory Memory assist level: More than reasonable amount of time   Medical Problem List and Plan: 1.  Decreased functional mobility with bilateral lower extremity weakness secondary to active demyelinating disease/multiple sclerosis.  Cont CIR. dispo remains an issue 2.  DVT Prophylaxis/Anticoagulation: subcutaneous heparin. Monitor platelet counts any signs of bleeding 3. Pain Management/fibromyalgia: Neurontin 600 mg 3 times a day, oxycodone as needed for dysesthetic pain  Joint arthralgias, may be from Lipitor, discontinued. She only started this medicine about a week before admission to hospital. She may follow up with her primary care physician to restart another agent  Xray of Knees reviewed, left knee with mild degenerative changes  Autoimmune labs unremarkable, except for elevated ESR  Will consider consult to Rheum  Mobic '15mg'$  daily with food started 10/18 4. Mood: provide emotional support  -team providing ego suppot 5. Neuropsych: This patient is capable of making  decisions on her own behalf. 6. Skin/Wound Care: Routine skin checks 7. Fluids/Electrolytes/Nutrition:   8. Hypertension.  Norvasc 10 mg daily  Lisinopril changed to '20mg'$  BID on 10/2  Increased toprol to '75mg'$  daily on 9/29  Hydralazine 25 TID starting 10/14, increased to 50 on 10/15, increased to 75 on 10/16  Renal artery ultrasound  unremarkable stenosis  Improving 10/18 Vitals:   12/14/16 0602 12/14/16 0924  BP: (!) 171/77 (!) 153/67  Pulse: 72 74  Resp: 18   Temp: 98 F (36.7 C)   SpO2: (!) 9%    Labile, but improving on 10/18   9.Diabetes mellitus of peripheral neuropathy.Latest hemoglobin A1c of 14. uncontrolled   NovoLog increased to 5U with meals.   Lantus increased to 17 units AM on 10/17  Lantus 5 U qhs started 10/8, increased to 10 on 10/10,increase to 17 on 12/13/2016  Has been very sensitive to changes at times CBG (last 3)   Recent Labs  12/13/16 1705 12/13/16 2049 12/14/16 0642  GLUCAP 174* 200* 317*   SSI adjusted  Appreciate Diabetic coordinator recommendations  Improving on 10/18 10.Hyperlipidemia. Lipitor 11. Neurogenic bladder. Removed foley  -continue voiding trial  Flomax started on 10/2, increased on 10/5  Bethanechol 10 TID started 10/3, increased to 25 on 10/4, increased to 50 on 10/8  Changed cath scheduled to Q 6 hours 12.Medical noncompliance. Provide counseling 13. AKI  Cr 1.44 on 10/17  Encourage fluids  Continue same medications and follow serially 14. Sleep disturbance  Trazodone scheduled on 10/17 15. Cognition  Appreciate SLP eval 16. Leukocytosis: Resolved 17. ABLA  Hb 9.9 on 10/12 18. Acute lower UTI   Completed Macrobid  Urine culture with greater than 100,000 e coli, pan-sensitive   LOS (Days) 22 A FACE TO FACE EVALUATION WAS PERFORMED  Zohar Laing Lorie Phenix, MD 12/14/2016 9:58 AM

## 2016-12-15 ENCOUNTER — Inpatient Hospital Stay (HOSPITAL_COMMUNITY): Payer: Self-pay | Admitting: Occupational Therapy

## 2016-12-15 ENCOUNTER — Inpatient Hospital Stay (HOSPITAL_COMMUNITY): Payer: Self-pay | Admitting: Physical Therapy

## 2016-12-15 LAB — GLUCOSE, CAPILLARY
Glucose-Capillary: 240 mg/dL — ABNORMAL HIGH (ref 65–99)
Glucose-Capillary: 211 mg/dL — ABNORMAL HIGH (ref 65–99)
Glucose-Capillary: 311 mg/dL — ABNORMAL HIGH (ref 65–99)

## 2016-12-15 MED ORDER — MELOXICAM 10 MG PO CAPS: 15.0000 mg | ORAL_CAPSULE | Freq: Every morning | ORAL | 0 refills | Status: DC

## 2016-12-15 MED ORDER — ATORVASTATIN CALCIUM 40 MG PO TABS: 40.0000 mg | ORAL_TABLET | Freq: Every day | ORAL | 3 refills | Status: DC

## 2016-12-15 MED ORDER — GABAPENTIN 300 MG PO CAPS: 600.0000 mg | ORAL_CAPSULE | Freq: Three times a day (TID) | ORAL | 0 refills | Status: DC

## 2016-12-15 MED ORDER — INSULIN GLARGINE 100 UNITS/ML SOLOSTAR PEN: 17.0000 [IU] | PEN_INJECTOR | Freq: Two times a day (BID) | SUBCUTANEOUS | 11 refills | Status: DC

## 2016-12-15 MED ORDER — HYDRALAZINE HCL 25 MG PO TABS: 75.0000 mg | ORAL_TABLET | Freq: Three times a day (TID) | ORAL | 0 refills | Status: DC

## 2016-12-15 MED ORDER — TAMSULOSIN HCL 0.4 MG PO CAPS: 0.8000 mg | ORAL_CAPSULE | Freq: Every day | ORAL | 0 refills | Status: DC

## 2016-12-15 MED ORDER — INSULIN ASPART 100 UNIT/ML FLEXPEN: 5.0000 [IU] | PEN_INJECTOR | Freq: Three times a day (TID) | SUBCUTANEOUS | 11 refills | Status: DC

## 2016-12-15 MED ORDER — TRAZODONE HCL 50 MG PO TABS: 50.0000 mg | ORAL_TABLET | Freq: Every day | ORAL | 0 refills | Status: DC

## 2016-12-15 MED ORDER — METOPROLOL SUCCINATE ER 25 MG PO TB24: 75.0000 mg | ORAL_TABLET | Freq: Every day | ORAL | 0 refills | Status: DC

## 2016-12-15 MED ORDER — LISINOPRIL 20 MG PO TABS: 20.0000 mg | ORAL_TABLET | Freq: Two times a day (BID) | ORAL | 0 refills | Status: DC

## 2016-12-15 MED ORDER — GLUCOSE BLOOD VI STRP
ORAL_STRIP | 12 refills | Status: DC
Start: 2016-12-15 — End: 2017-01-02

## 2016-12-15 MED ORDER — AMLODIPINE BESYLATE 10 MG PO TABS: 10.0000 mg | ORAL_TABLET | Freq: Every day | ORAL | 1 refills | Status: DC

## 2016-12-15 MED ORDER — PANTOPRAZOLE SODIUM 40 MG PO TBEC: 40.0000 mg | DELAYED_RELEASE_TABLET | Freq: Every day | ORAL | 0 refills | Status: DC

## 2016-12-15 MED ORDER — OXYCODONE HCL 10 MG PO TABS: 10.0000 mg | ORAL_TABLET | ORAL | 0 refills | Status: DC | PRN

## 2016-12-15 MED ORDER — AGAMATRIX PRESTO PRO METER DEVI: 0 refills | Status: DC

## 2016-12-15 MED ORDER — BETHANECHOL CHLORIDE 50 MG PO TABS: 50.0000 mg | ORAL_TABLET | Freq: Three times a day (TID) | ORAL | 0 refills | Status: DC

## 2016-12-15 MED FILL — AMLODIPINE BESYLATE 10 MG T: 10 | 30 days supply | Qty: 30 | Fill #0

## 2016-12-15 MED FILL — ATORVASTATIN 40 MG TABLET: 40 | 30 days supply | Qty: 30 | Fill #0

## 2016-12-15 MED FILL — TAMSULOSIN HCL 0.4 MG CAP: 0.4 | 15 days supply | Qty: 30 | Fill #0

## 2016-12-15 MED FILL — traZODone HCL 50 MG TABS: 50 | 30 days supply | Qty: 30 | Fill #0

## 2016-12-15 MED FILL — ONE TOUCH DELICA 33G LANCET: 34 days supply | Qty: 100 | Fill #0

## 2016-12-15 MED FILL — METOPROLOL SUCC ER 25 MG TA: 25 | 10 days supply | Qty: 30 | Fill #0

## 2016-12-15 MED FILL — LISINOPRIL 20 MG TABS: 20 | 30 days supply | Qty: 60 | Fill #0

## 2016-12-15 MED FILL — UNIFINE PENTIPS 31GX3/16: 31G X 5 MM | 34 days supply | Qty: 100 | Fill #0

## 2016-12-15 MED FILL — MELOXICAM 15 MG TABLET: 15 | 30 days supply | Qty: 30 | Fill #0

## 2016-12-15 MED FILL — ONETOUCH VERIO TEST STRIP: 34 days supply | Qty: 100 | Fill #0

## 2016-12-15 MED FILL — BETHANECHOL 25 MG TABLET: 25 | 10 days supply | Qty: 60 | Fill #0

## 2016-12-15 MED FILL — oxyCODONE HCL 10 MG TABS: 10 | 4 days supply | Qty: 20 | Fill #0

## 2016-12-15 MED FILL — LANTUS SOLOSTAR 100 UNITS/M: 100 | 26 days supply | Qty: 9 | Fill #0

## 2016-12-15 MED FILL — GABAPENTIN 300 MG CAPSULE: 300 | 30 days supply | Qty: 180 | Fill #0

## 2016-12-15 MED FILL — hydrALAZINE HCL 25 MG TABS: 25 | 15 days supply | Qty: 90 | Fill #0

## 2016-12-15 MED FILL — NOVOLOG FLEXPEN SYRINGE: 100 | 20 days supply | Qty: 3 | Fill #0

## 2016-12-15 MED FILL — PANTOPRAZOLE SOD DR 40 MG T: 40 | 30 days supply | Qty: 30 | Fill #0

## 2016-12-15 MED FILL — UNIFINE PENTIPS 31GX3/16": 31G X 5 MM | 34 days supply | Qty: 100 | Fill #0

## 2016-12-15 NOTE — Progress Notes (Signed)
Called and left message for Adult Protective Services. Waiting on return call.

## 2016-12-15 NOTE — Discharge Summary (Signed)
Discharge summary job [id number]

## 2016-12-15 NOTE — Progress Notes (Addendum)
Physical Therapy Discharge Note  Patient Details  Name: KAREMA TOCCI MRN: [id number] Date of Birth: 04-06-1965  1330-1355 25 minutes  1:1 Pt with c/o bilat hip pain, RN aware. Pt with flat affect in response to planned d/c for today.  PT provided encouragement to pt as well as educated pt on safety regarding mobility for d/c planning.  Pt continues with many questions regarding diagnosis and prognosis, PA made aware.  Patient has met  6 of 9 long term goals.  Pt has been limited in progress by medical issues and decreased motivation to participate. Pt will continue to benefit from skilled PT to address functional deficits.  Patient continues to demonstrate the following deficits muscle weakness and decreased standing balance, decreased postural control and decreased balance strategies and therefore will continue to benefit from skilled PT in a home health setting.     Skilled Therapeutic Interventions/Progress Updates:  Ambulation/gait training;Balance/vestibular training;Community reintegration;Discharge planning;DME/adaptive equipment instruction;Functional electrical stimulation;Functional mobility training;Neuromuscular re-education;Pain management;Patient/family education;Skin care/wound management;Stair training;Therapeutic Activities;Splinting/orthotics;UE/LE Coordination activities;Therapeutic Exercise;UE/LE Strength taining/ROM;Wheelchair propulsion/positioning     See Function Navigator for Current Functional Status.  Sandro Burgo 12/15/2016, 8:15 AM

## 2016-12-15 NOTE — Progress Notes (Signed)
Social Work  Discharge Note  The overall goal for the admission was met for:   Discharge location: No - pt homeless and team recommending 24/7 supervision due to safety concerns stemming from chronic medical issues.  I was instructed to contact the local shelter and was informed that they were full and to contact them on Monday to discuss case.  Instructed by Larina Bras, Rehab Director to prepare patient for d/c today per administration.  Pt continues to make phone calls and reports still no d/c location confirmed.    Length of Stay: No - extended due to social and medical issues - LOS = 23 days   Discharge activity level: Yes - supervision  Home/community participation: Yes   Services provided included: MD, RD, PT, OT, SLP, RN, TR, Pharmacy, Neuropsych and SW  Financial Services: NA  Follow-up services arranged: Home Health: PT, OT, ST, RN recommended, however, cannot arrange with agency until an address is confirmed, DME: 18x18 lightweight w/c, cushion, rolling walker, 3n1 commode and tub bench via [facility] and Patient/Family has no preference for HH/DME agencies  Comments (or additional information):  Patient/Family verbalized understanding of follow-up arrangements: Yes  Individual responsible for coordination of the follow-up plan: pt  Confirmed correct DME delivered: Deniel Mcquiston 12/15/2016    Jamieka Royle

## 2016-12-15 NOTE — Discharge Instructions (Signed)
Inpatient Rehab Discharge Instructions  PATSEY PONTORIERO Discharge date and time: No discharge date for patient encounter.   Activities/Precautions/ Functional Status: Activity: activity as tolerated Diet: diabetic diet Wound Care: none needed Functional status:  ___ No restrictions     ___ Walk up steps independently ___ 24/7 supervision/assistance   ___ Walk up steps with assistance ___ Intermittent supervision/assistance  ___ Bathe/dress independently ___ Walk with walker     _x__ Bathe/dress with assistance ___ Walk Independently    ___ Shower independently ___ Walk with assistance    ___ Shower with assistance ___ No alcohol     ___ Return to work/school ________  Special Instructions: Continue intermittent catheterizations every 6 hours   My questions have been answered and I understand these instructions. I will adhere to these goals and the provided educational materials after my discharge from the hospital.  Patient/Caregiver Signature _______________________________ Date __________  Clinician Signature _______________________________________ Date __________  Please bring this form and your medication list with you to all your follow-up doctor's appointments.

## 2016-12-15 NOTE — Progress Notes (Signed)
Occupational Therapy Session Note  Patient Details  Name: Debra [name] MRN: [id number] Date of Birth: Aug 08, 1965  Today's Date: 12/15/2016 OT Individual Time: 2111-5520 OT Individual Time Calculation (min): 24 min  Missed 21 mins due to nausea   Short Term Goals: Week 4:  OT Short Term Goal 1 (Week 4): STG = LTGs of supervision overall  Skilled Therapeutic Interventions/Progress Updates:    Attempted to engage pt in self-care retraining tasks, however pt reporting nausea.  Provided pt with cool cloth to wash face for grooming and to attempt to decrease symptoms of nausea.  Pt ate applesauce and saltine crackers while therapist attempted to encourage pt to engage in bathing/dressing tasks.  Pt reports feeling too nauseous to attempt tasks at this time.  Engaged in discussion regarding d/c planning, with continued encouragement to locate a place to which to d/c.  Pt continues to state she has "nowhere to go".  Pt missed 21 mins due to nausea and request to attempt later if feeling better.  Therapy Documentation Precautions:  Precautions Precautions: Fall Restrictions Weight Bearing Restrictions: No General: General OT Amount of Missed Time: 21 Minutes - nausea Pain: Pain Assessment Pain Assessment: 0-10 Pain Score: 6  Pain Type: Acute pain Pain Location: Leg Pain Orientation: Right Pain Descriptors / Indicators: Aching;Sharp Pain Onset: On-going Pain Intervention(s): Repositioned  See Function Navigator for Current Functional Status.   Therapy/Group: Individual Therapy  Rosalio Loud 12/15/2016, 12:02 PM

## 2016-12-15 NOTE — Progress Notes (Signed)
Occupational Therapy Discharge Summary  Patient Details  Name: Debra [name] MRN: [id number] Date of Birth: 01/24/1966   Patient has met 9 of 9 long term goals due to improved activity tolerance, improved balance and ability to compensate for deficits.  Patient to discharge with recommendation of overall Supervision level.  Patient's care partner unavailable to provide the necessary physical and cognitive assistance at discharge.  Patient is currently homeless with reports of everywhere being "full" and no one to provide the recommended supervision, however per administration pt to d/c from hospital.  Reasons goals not met: N/A  Recommendation:  Patient will benefit from ongoing skilled OT services in home health setting to continue to advance functional skills in the area of BADL and Reduce care partner burden.  Equipment: 3 in 1 and tub bench  Reasons for discharge: treatment goals met and discharge from hospital  Patient/family agrees with progress made and goals achieved: Yes  OT Discharge Precautions/Restrictions  Precautions Precautions: Fall Restrictions Weight Bearing Restrictions: No General OT Amount of Missed Time: 21 Minutes Vital Signs Therapy Vitals Temp: 98 F (36.7 C) Temp Source: Oral Pulse Rate: 64 Resp: 18 BP: 130/60 Patient Position (if appropriate): Sitting Oxygen Therapy SpO2: 99 % O2 Device: Not Delivered Pain Pain Assessment Faces Pain Scale: Hurts even more Pain Location: Hip Pain Orientation: Right;Left Pain Descriptors / Indicators: Aching Pain Intervention(s): RN made aware ADL  See Function Navigator Vision Baseline Vision/History: Wears glasses;Cataracts Wears Glasses: At all times Patient Visual Report: Blurring of vision;Other (comment) (increased difficulty with nighttime vision) Vision Assessment?: Yes Eye Alignment: Within Functional Limits Ocular Range of Motion: Within Functional Limits Alignment/Gaze Preference: Within  Defined Limits Tracking/Visual Pursuits: Decreased smoothness of vertical tracking Saccades: Within functional limits Convergence: Within functional limits Perception  Perception: Within Functional Limits Cognition Overall Cognitive Status: Within Functional Limits for tasks assessed Arousal/Alertness: Awake/alert Attention: Selective Selective Attention: Appears intact Awareness: Appears intact Problem Solving: Appears intact Safety/Judgment: Appears intact Sensation Sensation Light Touch: Impaired Detail Light Touch Impaired Details: Impaired RLE;Impaired LLE Stereognosis: Not tested Hot/Cold: Not tested Proprioception: Impaired Detail Proprioception Impaired Details: Impaired RLE;Impaired LLE Coordination Gross Motor Movements are Fluid and Coordinated: No Fine Motor Movements are Fluid and Coordinated: No Coordination and Movement Description: pt with mild ataxia in trunk and LEs during gait Finger Nose Finger Test: WFL, however Lt slower than Rt Motor  Motor Motor: Abnormal postural alignment and control Motor - Discharge Observations: generalized weakness, decreased trunk control  Trunk/Postural Assessment  Cervical Assessment Cervical Assessment: Within Functional Limits Thoracic Assessment Thoracic Assessment: Within Functional Limits Lumbar Assessment Lumbar Assessment: Within Functional Limits Postural Control Righting Reactions: delayed  Balance Dynamic Sitting Balance Sitting balance - Comments: mod I Dynamic Standing Balance Dynamic Standing - Comments: supervision with RW Extremity/Trunk Assessment RUE Assessment RUE Assessment: Within Functional Limits LUE Assessment LUE Assessment: Within Functional Limits   See Function Navigator for Current Functional Status.  Simonne Come 12/15/2016, 3:37 PM

## 2016-12-15 NOTE — Progress Notes (Signed)
Daughter contacted. Sending patient to Daughter's house via cab per [employer], [place].

## 2016-12-15 NOTE — Progress Notes (Signed)
With NT and RN assistance, patient was able to self-cath using sample cath kit and mirror. Verbalized understanding of clean procedure. Needed cues to keep tip of catheter from touching environment prior to cath.

## 2016-12-15 NOTE — Progress Notes (Signed)
Social Work Patient ID: Debra [name], female   DOB: 09-30-1965, 51 y.o.   MRN: [id number]   Have been informed by Financial Counseling dept that they are monitoring pt's case, however, still do not feel it is appropriate to complete a SSD and Medicaid application for pt as they feel she does not meet SSD criteria for long-term disability.  Without these applications being started,  I cannot pursue SNF/ RHP for pt as hospital will not offer LOG if no apps begun.  I have been instructed by Rae Halsted, Director of Social Work and by Kandis Mannan, Director of Rehab to inform pt that she will be discharged today and that she must make arrangements for a discharge location.  Tx team continues to recommend 24/7 supervision, close BS and BP monitoring and possible intermittent I/O cath needs which prohibits her from being a candidate for  local shelter housing.  Pt states that she understands she must discharge and she must take responsibility for finding someone who can assist her.  She has been making several phone calls since we spoke but has not yet secured a d/c location.  Will keep team posted.  Will order DME and await d/c location to arrange f/u [facility].  Have alerted tx team, MD and PA.  Gavinn Collard, LCSW

## 2016-12-15 NOTE — Progress Notes (Signed)
[facility]     PROGRESS NOTE    Subjective/Complaints: Pt seen laying in bed this AM.  She slept well overnight.  She wants to a flu shot.    Review of Systems  Respiratory: Negative for sputum production and shortness of breath.   Gastrointestinal: Negative for abdominal pain and diarrhea.  Musculoskeletal: Positive for back pain, joint pain and myalgias.  Skin: Negative for itching.  Neurological: Positive for sensory change and focal weakness.  Psychiatric/Behavioral: Negative for depression and suicidal ideas.    Objective: Vital Signs: Blood pressure (!) 144/59, pulse 72, temperature 98.3 F (36.8 C), temperature source Oral, resp. rate 16, height '5\' 4"'$  (1.626 m), weight 76.3 kg (168 lb 4.7 oz), SpO2 97 %. No results found. No results for input(s): WBC, HGB, HCT, PLT in the last 72 hours.  Recent Labs  12/13/16 0810  NA 134*  K 4.4  CL 101  GLUCOSE 240*  BUN 19  CREATININE 1.44*  CALCIUM 9.0   CBG (last 3)   Recent Labs  12/14/16 1625 12/14/16 2107 12/15/16 0637  GLUCAP 258* 272* 240*    Wt Readings from Last 3 Encounters:  12/13/16 76.3 kg (168 lb 4.7 oz)  11/18/16 76.2 kg (167 lb 15.9 oz)  11/07/16 68.4 kg (150 lb 12.8 oz)    Physical Exam:  Constitutional: She appears well-developed and well-nourished.  HENT: Normocephalic and atraumatic.  Eyes: EOM are normal. No discharge.  Cardiovascular: RRR. No JVD        Respiratory: CTA Bilaterally. Norma effort   GI: BS+, Non-distended  Musculoskeletal: No tenderness, no edema in extremities. Pain with hip and knee joint range of motion.  Neurological: She is alert and oriented.  Motor: B/l UE 5/5 proximal to distal RLE: HF 3+/5, KE 3+/5, ADF/PF 4+/5 (slightly variable depending on positioning and participation) LLE: HF, KE 4-/5, ADF/PF 4+/5 (slightly variable depending on positioning and participation) Skin: Skin is warm and dry.  Psychiatric: Extremely flat.    Assessment/Plan: 1. Functional and neurological deficits secondary to multiple sclerosis which require 3+ hours per day of interdisciplinary therapy in a comprehensive inpatient rehab setting. Physiatrist is providing close team supervision and 24 hour management of active medical problems listed below. Physiatrist and rehab team continue to assess barriers to discharge/monitor patient progress toward functional and medical goals.  Function:  Bathing Bathing position   Position: Sitting EOB  Bathing parts Body parts bathed by patient: Right arm, Left arm, Chest, Abdomen, Front perineal area, Buttocks, Right upper leg, Left upper leg, Right lower leg, Left lower leg Body parts bathed by helper: Back  Bathing assist Assist Level: Set up      Upper Body Dressing/Undressing Upper body dressing   What is the patient wearing?: Pull over shirt/dress Bra - Perfomed by patient: Thread/unthread right bra strap, Thread/unthread left bra strap, Hook/unhook bra (pull down sports bra)   Pull over shirt/dress - Perfomed by patient: Thread/unthread right sleeve, Thread/unthread left sleeve, Put head through opening, Pull shirt over trunk          Upper body assist Assist Level: Set up   Set up : To obtain clothing/put away  Lower Body Dressing/Undressing Lower body dressing   What is the patient wearing?: Pants, Ted Hose, Non-skid slipper socks   Underwear - Performed by helper: Thread/unthread right underwear leg, Thread/unthread left underwear leg Pants- Performed by patient: Thread/unthread right pants leg, Thread/unthread left pants leg, Pull pants up/down Pants- Performed by helper: Thread/unthread  right pants leg, Thread/unthread left pants leg, Pull pants up/down Non-skid slipper socks- Performed by patient: Don/doff right sock, Don/doff left sock Non-skid slipper socks- Performed by helper: Don/doff right sock, Don/doff left sock     Shoes - Performed by patient: Don/doff right  shoe, Don/doff left shoe Shoes - Performed by helper: Don/doff right shoe     TED Hose - Performed by patient: Don/doff left TED hose TED Hose - Performed by helper: Don/doff right TED hose  Lower body assist Assist for lower body dressing: Supervision or verbal cues      Toileting Toileting   Toileting steps completed by patient: Adjust clothing prior to toileting, Performs perineal hygiene, Adjust clothing after toileting Toileting steps completed by helper: Adjust clothing after toileting Toileting Assistive Devices: Grab bar or rail  Toileting assist Assist level: More than reasonable time   Transfers Chair/bed transfer   Chair/bed transfer method: Stand pivot Chair/bed transfer assist level: Supervision or verbal cues Chair/bed transfer assistive device: Medical sales representative     Max distance: 85 ft Assist level: Touching or steadying assistance (Pt > 75%)   Wheelchair   Type: Manual Max wheelchair distance: 150 Assist Level: Supervision or verbal cues  Cognition Comprehension Comprehension assist level: Follows complex conversation/direction with extra time/assistive device  Expression Expression assist level: Expresses complex ideas: With extra time/assistive device  Social Interaction Social Interaction assist level: Interacts appropriately with others with medication or extra time (anti-anxiety, antidepressant).  Problem Solving Problem solving assist level: Solves basic problems with no assist  Memory Memory assist level: More than reasonable amount of time   Medical Problem List and Plan: 1.  Decreased functional mobility with bilateral lower extremity weakness secondary to active demyelinating disease/multiple sclerosis.  Cont CIR. dispo remains an issue 2.  DVT Prophylaxis/Anticoagulation: subcutaneous heparin. Monitor platelet counts any signs of bleeding 3. Pain Management/fibromyalgia: Neurontin 600 mg 3 times a day, oxycodone as needed for  dysesthetic pain  Joint arthralgias, Lipitor discontinued. She only started this medicine about a week before admission to hospital. She may follow up with her primary care physician to restart another agent  Xray of Knees reviewed, left knee with mild degenerative changes  Autoimmune labs unremarkable, except for elevated ESR  Mobic '15mg'$  daily with food started 10/18  Will consider consult to Rheum 4. Mood: provide emotional support  -team providing ego suppot 5. Neuropsych: This patient is capable of making decisions on her own behalf. 6. Skin/Wound Care: Routine skin checks 7. Fluids/Electrolytes/Nutrition:   8. Hypertension.  Norvasc 10 mg daily  Lisinopril changed to '20mg'$  BID on 10/2  Increased toprol to '75mg'$  daily on 9/29  Hydralazine 25 TID starting 10/14, increased to 50 on 10/15, increased to 75 on 10/16  Renal artery ultrasound unremarkable stenosis  Overall improving 10/19 Vitals:   12/15/16 0500 12/15/16 0742  BP: (!) 150/71 (!) 144/59  Pulse: 70 72  Resp: 16   Temp: 98.3 F (36.8 C)   SpO2: 97%   9.Diabetes mellitus of peripheral neuropathy.Latest hemoglobin A1c of 14. uncontrolled   NovoLog increased to 5U with meals.   Lantus increased to 17 units AM on 10/17  Lantus 5 U qhs started 10/8, increased to 10 on 10/10,increase to 17 on 12/13/2016  Has been very sensitive to changes at times, will consider further increase tomorrow CBG (last 3)   Recent Labs  12/14/16 1625 12/14/16 2107 12/15/16 0637  GLUCAP 258* 272* 240*   SSI adjusted  Appreciate Diabetic  coordinator recommendations 10.Hyperlipidemia. Lipitor 11. Neurogenic bladder. Removed foley  -continue voiding trial  Flomax started on 10/2, increased on 10/5  Bethanechol 10 TID started 10/3, increased to 25 on 10/4, increased to 50 on 10/8  Changed cath scheduled to Q 6 hours 12.Medical noncompliance. Provide counseling 13. AKI  Cr 1.44 on 10/17  Encourage fluids  Continue same medications and  follow serially 14. Sleep disturbance  Trazodone scheduled on 10/17 15. Cognition  Appreciate SLP eval 16. Leukocytosis: Resolved 17. ABLA  Hb 9.9 on 10/12 18. Acute lower UTI   Completed Macrobid  Urine culture with greater than 100,000 e coli, pan-sensitive   LOS (Days) 23 A FACE TO FACE EVALUATION WAS PERFORMED  Akilah Cureton Lorie Phenix, MD 12/15/2016 8:09 AM

## 2016-12-15 NOTE — Progress Notes (Signed)
Inpatient Diabetes Program Recommendations  AACE/ADA: New Consensus Statement on Inpatient Glycemic Control (2015)  Target Ranges:  Prepandial:   less than 140 mg/dL      Peak postprandial:   less than 180 mg/dL (1-2 hours)      Critically ill patients:  140 - 180 mg/dL   Results for ODA, BREER (MRN [id number]) as of 12/15/2016 11:25  Ref. Range 12/14/2016 06:42 12/14/2016 11:50 12/14/2016 13:53 12/14/2016 16:25 12/14/2016 21:07 12/15/2016 06:37  Glucose-Capillary Latest Ref Range: 65 - 99 mg/dL 357 (H) 017 (H) 793 (H) 258 (H) 272 (H) 240 (H)   Review of Glycemic Control  Inpatient Diabetes Program Recommendations:    Current correction scale starts at 201. Please change Correction scale to Novolog Sensitive Correction 0-9 units tid.  Consider Lantus 19 units BID.  Thanks,  Christena Deem RN, MSN, [facility] Inpatient Diabetes Coordinator Team Pager [phone] (8a-5p)

## 2016-12-15 NOTE — Progress Notes (Signed)
Occupational Therapy Session Note  Patient Details  Name: TAYANNA TALFORD MRN: [id number] Date of Birth: 11/18/1965  Today's Date: 12/15/2016 OT Individual Time: 3016-0109 OT Individual Time Calculation (min): 21 min    Short Term Goals: Week 4:  OT Short Term Goal 1 (Week 4): STG = LTGs of supervision overall  Skilled Therapeutic Interventions/Progress Updates:    Upon OT arrival, pt seated EOB and reported she was [place] later this date.  Pt participated in tub transfer training via tub transfer bench and completed transfer to [facility] in preparation for DC.  Pt ambulated with RW and completed all transfers with S for safety.  Pt assisted back to room and left resting in bed with all needs met and bed alarm on at OT departure.  Therapy Documentation Precautions:  Precautions Precautions: Fall Restrictions Weight Bearing Restrictions: No General: General OT Amount of Missed Time: 39 Minutes Pain: Pain Assessment Pain Assessment: 0-10 Pain Score: 6  Pain Type: Acute pain Pain Location: Leg Pain Orientation: Right Pain Descriptors / Indicators: Aching;Sharp Pain Frequency: Constant Pain Onset: On-going Pain Intervention(s): Repositioned  See Function Navigator for Current Functional Status.   Therapy/Group: Individual Therapy  Marcella Dubs 12/15/2016, 10:45 AM

## 2016-12-18 ENCOUNTER — Inpatient Hospital Stay (HOSPITAL_COMMUNITY): Payer: Self-pay | Admitting: Physical Therapy

## 2016-12-18 ENCOUNTER — Inpatient Hospital Stay (HOSPITAL_COMMUNITY): Payer: Self-pay | Admitting: Occupational Therapy

## 2016-12-18 NOTE — Discharge Summary (Signed)
NAMArthur [name]:  [name], [place]                   ACCOUNT NO.:  [id number]  MEDICAL RECORD NO.:  [id number]  LOCATION:                                 FACILITY:  PHYSICIAN:  Debra MorrowAnkit [name], Debra [name]        DATE OF BIRTH:  1965/04/03  DATE OF ADMISSION:  11/22/2016 DATE OF DISCHARGE:  12/15/2016                              DISCHARGE SUMMARY   DISCHARGE DIAGNOSES: 1. Bilateral lower extremity weakness secondary to active     demyelinating disease. 2. Subcutaneous heparin for DVT prophylaxis. 3. Pain management. 4. Hypertension. 5. Diabetes mellitus. 6. Peripheral neuropathy. 7. Hyperlipidemia. 8. Neurogenic bladder. 9. Acute kidney injury. 10.Medical noncompliance. 11.Acute blood loss anemia. 12.Acute lower urinary tract infection.  HISTORY OF PRESENT ILLNESS:  This is a 51 year old right-handed female with history of CVA, gait dysfunction, noncompliant medical followup, diabetes mellitus, fibromyalgia.  She lives with a boyfriend with difficult social situation.  Presented on November 15, 2016, with progressive weakness of bilateral lower extremities as well as back pain over 2 months.  Sodium 127, creatinine 2.72.  Urine drug screen positive for opiates.  CT of abdomen and chest x-ray unremarkable.  Glucose 998, placed on an insulin drip.  MRI of thoracic spine showed no canal stenosis or foraminal narrowing, however, there was some paraspinal muscle edema.  Thoracic MRI showed a 3 x 13 mm intramedullary non- enhancing lesion at T10.  Serum CK was normal.  Debra consulted. Suspect lesion, no mass effect.  MRI followup showed mild multifocal cerebral white matter changes in a distribution most consistent with demyelinating disease.  No other acute abnormality.  MRI of cervical spine showed patchy cord signal abnormality extending from the craniocervical junction through the upper thoracic spine consistent with demyelinating disease.  Placed on IV Solu-Medrol x5 days.  Bouts of urinary  retention requiring intermittent catheterization, subcutaneous heparin for DVT prophylaxis.  The patient was admitted for comprehensive rehab.  PAST MEDICAL HISTORY:  See discharge diagnoses.  SOCIAL HISTORY:  She lives with a boyfriend with difficult social situation.  FUNCTIONAL STATUS UPON ADMISSION TO REHAB [name]:  Min to mod assist 60 feet rolling walker, moderate assist sit to stand, mod to max assist activities of daily living.  PHYSICAL EXAMINATION:  VITAL SIGNS:  Blood pressure 160/85, pulse 69, temperature 97, and respirations 20. GENERAL:  This was an alert female, mood was flat, but appropriate. HEENT:  EOMs intact. NECK:  Supple.  Nontender.  No JVD. CARDIAC:  Rate controlled. ABDOMEN:  Soft and nontender.  Good bowel sounds. LUNGS:  Clear to auscultation without wheeze. EXTREMITIES:  Bilateral upper extremities 5/5 proximal to distal.  Right lower extremity 3/5 hip flexors and knee extension, 3/5 ankle dorsi and plantarflexion.  Left lower extremity 4-/5.  REHABILITATION HOSPITAL COURSE:  The patient was admitted to [facility]patient [name].  Therapies initiated on a 3-hour daily basis consisting of physical therapy, occupational therapy, and rehabilitation nursing. The following issues were addressed during the patient's rehab stay. Pertaining to [name]'s acute demyelinating disease, she completed a course of Solu-Medrol.  Referral made outpatient to Debra [name], Debra [name].  Subcutaneous heparin for DVT prophylaxis. Neuropathic pain, continued  on Neurontin.  X-rays of knee showed some mild degenerative changes.  She would also continue on Mobic.  Blood pressures controlled with some adjustments in her hydralazine.  Renal ultrasound negative for stenosis.  Diabetes mellitus with poor medical compliance.  Hemoglobin A1c of 14.  She continued on insulin therapy, diabetic teaching.  It was quite questionable how compliant she would be with this  current regimen.  Neurogenic bladder, maintained on Flomax as well as Urecholine.  Catheterizations every 6 hours.  If she was unable to do these, a Foley tube could be placed.  Long discussions held in regard to medical noncompliance and motivation to maintain medical health.  Discharge planning remained appropriate, perhaps back home with her boyfriend versus even a shelter if a discharge plan could not be made.  The patient received weekly collaborative interdisciplinary team conferences to discuss estimated length of stay, family teaching, any barriers to discharge.  The patient continued to demonstrate deficits of muscle weakness, decreased standing balance, decreased postural control, decreased balance strategies.  Needing some motivation at times to participate.  Could ambulate up to 150 feet with a rolling walker and supervision.  Standing, static balance supervision.  Attempts at discharge for December 15, 2016.  DISCHARGE MEDICATIONS: 1. Norvasc 10 mg p.o. daily. 2. Urecholine 50 mg p.o. t.i.d. 3. Neurontin 600 mg p.o. t.i.d. 4. Hydralazine 75 mg p.o. every 8 hours. 5. NovoLog insulin 5 units t.i.d. 6. Lantus insulin 17 units b.i.d. 7. Lisinopril 20 mg p.o. b.i.d. 8. Mobic 50 mg p.o. daily. 9. Toprol-XL 75 mg p.o. daily. 10.Protonix 40 mg p.o. daily. 11.Flomax 0.8 mg at bedtime. 12.Oxycodone 10 mg every 4 as needed pain, dispensed 30 tablets.  DIET:  Diabetic diet.  FOLLOWUP:  She would follow up with Debra [name] at the Outpatient Rehab Service office as directed; Debra [name], Medical Management; Debra [name], [phone], Debra [name] in regard to multiple sclerosis and treatment.     Debra [name], P.A.   ______________________________ Debra [name], Debra [name]    DA/MEDQ  D:  12/15/2016  T:  12/15/2016  Job:  [id number]  cc:   Debra [name], Debra [name] Dr. Dairl Ponder, Debra [name]

## 2016-12-19 ENCOUNTER — Telehealth (HOSPITAL_COMMUNITY): Payer: Self-pay

## 2016-12-19 NOTE — Progress Notes (Signed)
I have spoken with pt's daughter, Calei Parral (ph#  [phone]) yesterday and today to confirm [facility], PT, ST, SW referral placed with [facility].  Also , confirmed appointment for pt at [facility] on 10/31 @ 10:15 am.  Daughter aware and will be able to provide transport to appointment.  Katrenia Alkins, LCSW

## 2016-12-20 ENCOUNTER — Encounter (HOSPITAL_COMMUNITY): Payer: Self-pay | Admitting: *Deleted

## 2016-12-20 ENCOUNTER — Emergency Department (HOSPITAL_COMMUNITY)
Admission: EM | Admit: 2016-12-20 | Discharge: 2016-12-22 | Disposition: A | Discharge status: Other Acute Inpatient Hospital | Payer: Self-pay | Attending: Emergency Medicine | Admitting: Emergency Medicine

## 2016-12-20 DIAGNOSIS — Z79899 Other long term (current) drug therapy: Secondary | ICD-10-CM | POA: Insufficient documentation

## 2016-12-20 DIAGNOSIS — M25551 Pain in right hip: Secondary | ICD-10-CM | POA: Insufficient documentation

## 2016-12-20 DIAGNOSIS — J45909 Unspecified asthma, uncomplicated: Secondary | ICD-10-CM | POA: Insufficient documentation

## 2016-12-20 DIAGNOSIS — F322 Major depressive disorder, single episode, severe without psychotic features: Secondary | ICD-10-CM | POA: Insufficient documentation

## 2016-12-20 DIAGNOSIS — Z794 Long term (current) use of insulin: Secondary | ICD-10-CM | POA: Insufficient documentation

## 2016-12-20 DIAGNOSIS — R45851 Suicidal ideations: Secondary | ICD-10-CM | POA: Insufficient documentation

## 2016-12-20 DIAGNOSIS — M25552 Pain in left hip: Secondary | ICD-10-CM | POA: Insufficient documentation

## 2016-12-20 DIAGNOSIS — Z046 Encounter for general psychiatric examination, requested by authority: Secondary | ICD-10-CM | POA: Insufficient documentation

## 2016-12-20 DIAGNOSIS — E119 Type 2 diabetes mellitus without complications: Secondary | ICD-10-CM | POA: Insufficient documentation

## 2016-12-20 DIAGNOSIS — I10 Essential (primary) hypertension: Secondary | ICD-10-CM | POA: Insufficient documentation

## 2016-12-20 DIAGNOSIS — Z7982 Long term (current) use of aspirin: Secondary | ICD-10-CM | POA: Insufficient documentation

## 2016-12-20 LAB — CBC
HEMATOCRIT: 35.2 % — AB (ref 36.0–46.0)
HEMOGLOBIN: 11.9 g/dL — AB (ref 12.0–15.0)
MCH: 29.7 pg (ref 26.0–34.0)
MCHC: 33.8 g/dL (ref 30.0–36.0)
MCV: 87.8 fL (ref 78.0–100.0)
Platelets: 348 10*3/uL (ref 150–400)
RBC: 4.01 MIL/uL (ref 3.87–5.11)
RDW: 14.2 % (ref 11.5–15.5)
WBC: 9.5 10*3/uL (ref 4.0–10.5)

## 2016-12-20 LAB — RAPID URINE DRUG SCREEN, HOSP PERFORMED
AMPHETAMINES: NOT DETECTED
Barbiturates: NOT DETECTED
Benzodiazepines: NOT DETECTED
COCAINE: NOT DETECTED
OPIATES: NOT DETECTED
TETRAHYDROCANNABINOL: NOT DETECTED

## 2016-12-20 LAB — COMPREHENSIVE METABOLIC PANEL
ALBUMIN: 3.7 g/dL (ref 3.5–5.0)
ALT: 27 U/L (ref 14–54)
AST: 22 U/L (ref 15–41)
Alkaline Phosphatase: 128 U/L — ABNORMAL HIGH (ref 38–126)
Anion gap: 10 (ref 5–15)
BUN: 19 mg/dL (ref 6–20)
CHLORIDE: 99 mmol/L — AB (ref 101–111)
CO2: 28 mmol/L (ref 22–32)
CREATININE: 1.14 mg/dL — AB (ref 0.44–1.00)
Calcium: 9.6 mg/dL (ref 8.9–10.3)
GFR calc non Af Amer: 56 mL/min — ABNORMAL LOW (ref >60)
GLUCOSE: 130 mg/dL — AB (ref 65–99)
Potassium: 3.8 mmol/L (ref 3.5–5.1)
SODIUM: 137 mmol/L (ref 135–145)
Total Bilirubin: 1 mg/dL (ref 0.3–1.2)
Total Protein: 7.5 g/dL (ref 6.5–8.1)

## 2016-12-20 LAB — ACETAMINOPHEN LEVEL

## 2016-12-20 LAB — CBG MONITORING, ED: GLUCOSE-CAPILLARY: 142 mg/dL — AB (ref 65–99)

## 2016-12-20 LAB — ETHANOL: Alcohol, Ethyl (B): <10 mg/dL (ref <10)

## 2016-12-20 LAB — POC URINE PREG, ED: Preg Test, Ur: NEGATIVE

## 2016-12-20 LAB — SALICYLATE LEVEL

## 2016-12-20 MED ORDER — ACETAMINOPHEN 325 MG PO TABS
650.0000 mg | ORAL_TABLET | ORAL | Status: DC | PRN
  Administered 2016-12-21: 650 mg via ORAL
  Filled 2016-12-20: qty 2

## 2016-12-20 MED ORDER — ZOLPIDEM TARTRATE 5 MG PO TABS: 5.0000 mg | ORAL_TABLET | Freq: Every evening | ORAL | Status: DC | PRN

## 2016-12-20 MED ORDER — ALUM & MAG HYDROXIDE-SIMETH 200-200-20 MG/5ML PO SUSP: 30.0000 mL | Freq: Four times a day (QID) | ORAL | Status: DC | PRN

## 2016-12-20 MED ORDER — ONDANSETRON HCL 4 MG PO TABS: 4.0000 mg | ORAL_TABLET | Freq: Three times a day (TID) | ORAL | Status: DC | PRN

## 2016-12-20 NOTE — ED Notes (Signed)
Pt refused blood to be drawn  

## 2016-12-20 NOTE — ED Notes (Signed)
No respiratory or acute distress noted alert and oriented call light in reach hospital sitter watching pt. 

## 2016-12-20 NOTE — ED Provider Notes (Signed)
[facility]-EMERGENCY DEPT Provider Note   CSN: [id number] Arrival date & time: 12/20/16  1830     History   Chief Complaint Chief Complaint  Patient presents with  . Suicidal    HPI Debra [name] is a 51 y.o. female.  Level 5 caveat for psychiatric illness.  Recent admission from 11/22/16 through 12/15/16 [facility] for complication related to diabetes, hypertension, peripheral neuropathy, and multiple other health problems.  She is now depressed and suicidal.  She states she would take an insulin overdose.  She does not appear to be psychotic.      Past Medical History:  Diagnosis Date  . Adenomatous colonic polyps   . Anemia    2005  . Anxiety    1990  . Arthritis   . Asthma    2000  . Cataract   . Depression 1990  . Difficult intubation    narrow airway  . Gastroesophageal Reflux Disease (GERD)   . Heart murmur    Birth  . Hyperlipidemia    2005  . Hypertension    1998  . Internal hemorrhoids 04/24/06   on colonoscopy  . Migraine   . Neuropathy of the hands & feet   . Restless Leg Syndrome   . Right Ankle Fracture 10/06/2013  . Sleep paralysis   . Stable angina pectoris    2007: cath showing normal cors.   . Stroke 1990  . Type I Diabetes Mellitus 1988    Patient Active Problem List   Diagnosis Date Noted  . Multiple joint pain   . HTN (hypertension)   . Acute pain of left knee   . Neurogenic bladder   . Acute lower UTI   . Labile blood pressure   . Acute blood loss anemia   . Leukocytosis   . Labile blood glucose   . Hypertensive crisis   . Hypoglycemia   . Poorly controlled type 2 diabetes mellitus with peripheral neuropathy (HCC)   . Uncontrolled hypertension   . Sleep disturbance   . Multiple sclerosis (HCC) 11/22/2016  . Thoracic root lesion 11/22/2016  . MS (multiple sclerosis) (HCC)   . Neuropathic pain   . Uncontrolled type 2 diabetes mellitus with peripheral neuropathy (HCC)   . Urinary retention    . Medically noncompliant   . Leg weakness, bilateral 11/20/2016  . Diabetic ketoacidosis without coma associated with type 1 diabetes mellitus (HCC)   . Leg pain   . History of CVA (cerebrovascular accident)   . Uncontrolled type 1 diabetes mellitus with diabetic peripheral neuropathy (HCC)   . Benign essential HTN   . Hypertensive urgency   . AKI (acute kidney injury) (HCC)   . Fibromyalgia 11/07/2016  . Back pain 10/31/2016  . Stable angina pectoris (HCC)   . Vitamin D deficiency 08/31/2014  . 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 Hyperlipidemia 10/06/2013  . Depression 10/16/2012  . Bilateral knee pain 02/16/2012  . Chronic diarrhea 10/19/2010  . Peripheral neuropathy 08/19/2010  . 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 Hypertension 07/06/2010  . Asthma 07/06/2010  . Migraine 07/06/2010    Past Surgical History:  Procedure Laterality Date  . bilateral foot surgery    . BREAST SURGERY Left    biopsy left breast  . CARDIAC CATHETERIZATION  2007   around 2007 or 2008  . CESAREAN SECTION    . CHOLECYSTECTOMY    . COLONOSCOPY W/ BIOPSIES AND  POLYPECTOMY    . DILATION AND CURETTAGE OF UTERUS  12/29/2010   Surgeon: Tereso Newcomer, MD;  Location: [facility] ORS;  Service: Gynecology  . ENDOMETRIAL ABLATION W/ NOVASURE N/A 12/2009  . EYE SURGERY    . HYSTEROSCOPY  12/29/2010   Procedure: HYSTEROSCOPY WITH HYDROTHERMAL ABLATION;  Surgeon: Tereso Newcomer, MD;  Location: [facility] ORS;  Service: Gynecology;;  . IUD REMOVAL  12/29/2010   Procedure: INTRAUTERINE DEVICE (IUD) REMOVAL;  Surgeon: Tereso Newcomer, MD;  Location: [facility] ORS;  Service: Gynecology;  Laterality: N/A;  . LAPAROSCOPIC TUBAL LIGATION  12/29/2010   Procedure: LAPAROSCOPIC TUBAL LIGATION;  Surgeon: Tereso Newcomer, MD;  Location: [facility] ORS;  Service: Gynecology;  Laterality: Bilateral;  . ORIF ANKLE FRACTURE Right 10/09/2013   Procedure: Open reduction internal  fixation right ankle;  Surgeon: Mable Paris, MD;  Location: [facility];  Service: Orthopedics;  Laterality: Right;  Open reduction internal fixation right ankle  . TRIGGER FINGER RELEASE     x 3    OB History    Gravida Para Term Preterm AB Living   2 2 1 1  0 2   SAB TAB Ectopic Multiple Live Births   0 0 0 0 2       Home Medications    Prior to Admission medications   Medication Sig Start Date End Date Taking? Authorizing Provider  amLODipine (NORVASC) 10 MG tablet Take 1 tablet (10 mg total) by mouth daily. 12/15/16 12/15/17 Yes Angiulli, Mcarthur Rossetti, PA-C  aspirin EC 81 MG tablet Take 1 tablet (81 mg total) by mouth daily. 11/14/16  Yes Nyra Market, MD  atorvastatin (LIPITOR) 40 MG tablet Take 1 tablet (40 mg total) by mouth daily. 12/15/16  Yes Angiulli, Mcarthur Rossetti, PA-C  gabapentin (NEURONTIN) 300 MG capsule Take 2 capsules (600 mg total) by mouth 3 (three) times daily. 12/15/16  Yes Angiulli, Mcarthur Rossetti, PA-C  hydrALAZINE (APRESOLINE) 25 MG tablet Take 3 tablets (75 mg total) by mouth every 8 (eight) hours. 12/15/16  Yes Angiulli, Mcarthur Rossetti, PA-C  insulin aspart (NOVOLOG FLEXPEN) 100 UNIT/ML FlexPen Inject 5 Units into the skin 3 (three) times daily with meals. Patient taking differently: Inject 0-10 Units into the skin 3 (three) times daily with meals.  12/15/16  Yes Angiulli, Mcarthur Rossetti, PA-C  insulin glargine (LANTUS) 100 unit/mL SOPN Inject 0.17 mLs (17 Units total) into the skin 2 (two) times daily. 12/15/16  Yes Angiulli, Mcarthur Rossetti, PA-C  lisinopril (PRINIVIL,ZESTRIL) 20 MG tablet Take 1 tablet (20 mg total) by mouth 2 (two) times daily. 12/15/16  Yes Angiulli, Mcarthur Rossetti, PA-C  Meloxicam 10 MG CAPS Take 15 mg by mouth every morning. 12/15/16  Yes Angiulli, Mcarthur Rossetti, PA-C  oxyCODONE 10 MG TABS Take 1 tablet (10 mg total) by mouth every 4 (four) hours as needed for moderate pain. 12/15/16  Yes Angiulli, Mcarthur Rossetti, PA-C  pantoprazole (PROTONIX) 40 MG tablet Take 1 tablet (40 mg  total) by mouth daily. 12/16/16  Yes Angiulli, Mcarthur Rossetti, PA-C  bethanechol (URECHOLINE) 25 MG tablet Take 50 mg by mouth 3 (three) times daily. 12/15/16   [provider]  bethanechol (URECHOLINE) 50 MG tablet Take 1 tablet (50 mg total) by mouth 3 (three) times daily. 12/15/16   Angiulli, Mcarthur Rossetti, PA-C  Blood Glucose Monitoring Suppl (AGAMATRIX PRESTO PRO METER) DEVI The patient is insulin requiring, ICD 10 code E10.9. The patient tests 4 times per day. 12/15/16   Angiulli, Mcarthur Rossetti, PA-C  glucose blood (  AGAMATRIX AMP TEST) test strip Use as instructed 12/15/16   Angiulli, Mcarthur Rossetti, PA-C  insulin glargine (LANTUS) 100 unit/mL SOPN Inject 0.17 mLs (17 Units total) into the skin 2 (two) times daily. Patient not taking: Reported on 12/20/2016 12/15/16   Angiulli, Mcarthur Rossetti, PA-C  metoprolol succinate (TOPROL-XL) 25 MG 24 hr tablet Take 3 tablets (75 mg total) by mouth daily. 12/16/16   Angiulli, Daniel J, PA-C  NITROSTAT 0.4 MG SL tablet PLACE 1 TABLET UNDER TONGUE AS NEEDED FOR CHEST PAIN EVERY 5 MINUTES X 3 MAX DOSES.  CALL 911 IF PAIN PERSISTS 11/21/16   [provider]  PROVENTIL HFA 108 (90 Base) MCG/ACT inhaler INHALE 1 TO 2 PUFFS BY MOUTH EVERY 6 HOURS AS NEEDED FOR COUGHING, WHEEZING, OR SHORTNESS OF BREATH 11/21/16   [provider]  tamsulosin (FLOMAX) 0.4 MG CAPS capsule Take 2 capsules (0.8 mg total) by mouth daily after breakfast. 12/15/16   Angiulli, Mcarthur Rossetti, PA-C  traZODone (DESYREL) 50 MG tablet Take 1 tablet (50 mg total) by mouth at bedtime. 12/15/16   Angiulli, Mcarthur Rossetti, PA-C    Family History Family History  Problem Relation Age of Onset  . Hypertension Mother   . Kidney disease Mother   . Hypertension Father   . Breast cancer Maternal Grandmother   . Prostate cancer Maternal Grandfather   . Ovarian cancer Paternal Grandmother   . Prostate cancer Paternal Grandfather   . Colon cancer Maternal Uncle        Family history of malignant neoplasm of  gastrointestinal tract    Social History Social History  Substance Use Topics  . Smoking status: Never Smoker  . Smokeless tobacco: Never Used  . Alcohol use No     Allergies   Penicillins; Pollen extract; and Tape   Review of Systems Review of Systems  Unable to perform ROS: Psychiatric disorder     Physical Exam Updated Vital Signs BP (!) 202/99 (BP Location: Left Arm)   Pulse 81   Temp 98.4 F (36.9 C) (Oral)   Resp 18   Ht 5\' 4"  (1.626 m)   Wt 68 kg (150 lb)   LMP  (Approximate) Comment: ablation no lmp since 2011  SpO2 100%   BMI 25.75 kg/m   Physical Exam  Constitutional: She is oriented to person, place, and time. She appears well-developed and well-nourished.  HENT:  Head: Normocephalic and atraumatic.  Eyes: Conjunctivae are normal.  Neck: Neck supple.  Cardiovascular: Normal rate and regular rhythm.   Pulmonary/Chest: Effort normal and breath sounds normal.  Abdominal: Soft. Bowel sounds are normal.  Musculoskeletal: Normal range of motion.  Neurological: She is alert and oriented to person, place, and time.  Skin: Skin is warm and dry.  Psychiatric:  Flat affect, depressed  Nursing note and vitals reviewed.    ED Treatments / Results  Labs (all labs ordered are listed, but only abnormal results are displayed) Labs Reviewed  COMPREHENSIVE METABOLIC PANEL - Abnormal; Notable for the following:       Result Value   Chloride 99 (*)    Glucose, Bld 130 (*)    Creatinine, Ser 1.14 (*)    Alkaline Phosphatase 128 (*)    GFR calc non Af Amer 56 (*)    All other components within normal limits  ACETAMINOPHEN LEVEL - Abnormal; Notable for the following:    Acetaminophen (Tylenol), Serum <10 (*)    All other components within normal limits  CBC - Abnormal; Notable for  the following:    Hemoglobin 11.9 (*)    HCT 35.2 (*)    All other components within normal limits  CBG MONITORING, ED - Abnormal; Notable for the following:    Glucose-Capillary  142 (*)    All other components within normal limits  ETHANOL  SALICYLATE LEVEL  RAPID URINE DRUG SCREEN, HOSP PERFORMED    EKG  EKG Interpretation None       Radiology No results found.  Procedures Procedures (including critical care time)  Medications Ordered in ED Medications - No data to display   Initial Impression / Assessment and Plan / ED Course  I have reviewed the triage vital signs and the nursing notes.  Pertinent labs & imaging results that were available during my care of the patient were reviewed by me and considered in my medical decision making (see chart for details).    Patient has a multitude of healthcare problems.  She is now depressed and suicidal.  Will obtain behavioral health consult.   Final Clinical Impressions(s) / ED Diagnoses   Final diagnoses:  Depression, unspecified depression type  Suicidal ideation    New Prescriptions New Prescriptions   No medications on file     Donnetta Hutchingook, Braydn Carneiro, MD 12/20/16 2203

## 2016-12-20 NOTE — ED Notes (Addendum)
No respiratory or acute distress noted resting in bed with eyes closed call light in reach sitter at bedside. Hospital sitter watching pt.

## 2016-12-20 NOTE — ED Notes (Signed)
Bed: WA28 Expected date:  Expected time:  Means of arrival:  Comments: 

## 2016-12-20 NOTE — Progress Notes (Signed)
12/20/16  1908  Notified MD patient's BP 202/99

## 2016-12-20 NOTE — ED Triage Notes (Signed)
Pt states she was d/c from [facility] last Friday. Pt here c/o depression and SI. Pt plan is to overdose on medicines. Pt states she plan to overdose on insulin.

## 2016-12-21 LAB — CBG MONITORING, ED
GLUCOSE-CAPILLARY: 303 mg/dL — AB (ref 65–99)
GLUCOSE-CAPILLARY: 400 mg/dL — AB (ref 65–99)
Glucose-Capillary: 275 mg/dL — ABNORMAL HIGH (ref 65–99)
Glucose-Capillary: 475 mg/dL — ABNORMAL HIGH (ref 65–99)

## 2016-12-21 MED ORDER — GABAPENTIN 300 MG PO CAPS
600.0000 mg | ORAL_CAPSULE | Freq: Three times a day (TID) | ORAL | Status: DC
  Administered 2016-12-21 – 2016-12-22 (×5): 600 mg via ORAL
  Filled 2016-12-21 (×5): qty 2

## 2016-12-21 MED ORDER — INSULIN ASPART 100 UNIT/ML FLEXPEN: 0.0000 [IU] | PEN_INJECTOR | Freq: Three times a day (TID) | SUBCUTANEOUS | Status: DC

## 2016-12-21 MED ORDER — INSULIN ASPART 100 UNIT/ML ~~LOC~~ SOLN
10.0000 [IU] | Freq: Once | SUBCUTANEOUS | Status: AC
  Administered 2016-12-21: 10 [IU] via SUBCUTANEOUS
  Filled 2016-12-21: qty 1

## 2016-12-21 MED ORDER — INSULIN ASPART 100 UNIT/ML ~~LOC~~ SOLN
10.0000 [IU] | Freq: Three times a day (TID) | SUBCUTANEOUS | Status: DC
  Administered 2016-12-22 (×2): 10 [IU] via SUBCUTANEOUS
  Filled 2016-12-21 (×2): qty 1

## 2016-12-21 MED ORDER — MELOXICAM 15 MG PO TABS
15.0000 mg | ORAL_TABLET | Freq: Every morning | ORAL | Status: DC
  Administered 2016-12-21 – 2016-12-22 (×2): 15 mg via ORAL
  Filled 2016-12-21 (×2): qty 1

## 2016-12-21 MED ORDER — TRAZODONE HCL 50 MG PO TABS
50.0000 mg | ORAL_TABLET | Freq: Every day | ORAL | Status: DC
  Administered 2016-12-21: 50 mg via ORAL
  Filled 2016-12-21: qty 1

## 2016-12-21 MED ORDER — ASPIRIN EC 81 MG PO TBEC
81.0000 mg | DELAYED_RELEASE_TABLET | Freq: Every day | ORAL | Status: DC
  Administered 2016-12-21 – 2016-12-22 (×2): 81 mg via ORAL
  Filled 2016-12-21 (×2): qty 1

## 2016-12-21 MED ORDER — ATORVASTATIN CALCIUM 40 MG PO TABS
40.0000 mg | ORAL_TABLET | Freq: Every day | ORAL | Status: DC
  Administered 2016-12-21 – 2016-12-22 (×2): 40 mg via ORAL
  Filled 2016-12-21 (×2): qty 1

## 2016-12-21 MED ORDER — INSULIN GLARGINE 100 UNIT/ML ~~LOC~~ SOLN
17.0000 [IU] | Freq: Two times a day (BID) | SUBCUTANEOUS | Status: DC
  Administered 2016-12-21 – 2016-12-22 (×3): 17 [IU] via SUBCUTANEOUS
  Filled 2016-12-21 (×4): qty 0.17

## 2016-12-21 MED ORDER — BETHANECHOL CHLORIDE 25 MG PO TABS: 50.0000 mg | ORAL_TABLET | Freq: Three times a day (TID) | ORAL | Status: DC

## 2016-12-21 MED ORDER — HYDRALAZINE HCL 50 MG PO TABS
75.0000 mg | ORAL_TABLET | Freq: Three times a day (TID) | ORAL | Status: DC
  Administered 2016-12-21 – 2016-12-22 (×5): 75 mg via ORAL
  Filled 2016-12-21 (×6): qty 1

## 2016-12-21 MED ORDER — PANTOPRAZOLE SODIUM 40 MG PO TBEC
40.0000 mg | DELAYED_RELEASE_TABLET | Freq: Every day | ORAL | Status: DC
  Administered 2016-12-21 – 2016-12-22 (×2): 40 mg via ORAL
  Filled 2016-12-21 (×2): qty 1

## 2016-12-21 MED ORDER — INSULIN GLARGINE 100 UNITS/ML SOLOSTAR PEN
17.0000 [IU] | PEN_INJECTOR | Freq: Two times a day (BID) | SUBCUTANEOUS | Status: DC
  Filled 2016-12-21: qty 3

## 2016-12-21 MED ORDER — LISINOPRIL 20 MG PO TABS
20.0000 mg | ORAL_TABLET | Freq: Two times a day (BID) | ORAL | Status: DC
  Administered 2016-12-21 – 2016-12-22 (×3): 20 mg via ORAL
  Filled 2016-12-21 (×4): qty 1

## 2016-12-21 MED ORDER — BETHANECHOL CHLORIDE 25 MG PO TABS
50.0000 mg | ORAL_TABLET | Freq: Three times a day (TID) | ORAL | Status: DC
  Administered 2016-12-21 – 2016-12-22 (×4): 50 mg via ORAL
  Filled 2016-12-21 (×7): qty 2

## 2016-12-21 MED ORDER — METOPROLOL SUCCINATE ER 50 MG PO TB24
75.0000 mg | ORAL_TABLET | Freq: Every day | ORAL | Status: DC
  Administered 2016-12-21 – 2016-12-22 (×2): 75 mg via ORAL
  Filled 2016-12-21 (×2): qty 1

## 2016-12-21 MED ORDER — INSULIN ASPART 100 UNIT/ML ~~LOC~~ SOLN
10.0000 [IU] | Freq: Once | SUBCUTANEOUS | Status: AC
  Administered 2016-12-21: 10 [IU] via INTRAVENOUS
  Filled 2016-12-21: qty 1

## 2016-12-21 MED ORDER — SODIUM CHLORIDE 0.9 % IV BOLUS (SEPSIS)
2000.0000 mL | Freq: Once | INTRAVENOUS | Status: AC
  Administered 2016-12-21: 2000 mL via INTRAVENOUS

## 2016-12-21 MED ORDER — AMLODIPINE BESYLATE 5 MG PO TABS
10.0000 mg | ORAL_TABLET | Freq: Every day | ORAL | Status: DC
  Administered 2016-12-21 – 2016-12-22 (×2): 10 mg via ORAL
  Filled 2016-12-21 (×2): qty 2

## 2016-12-21 MED ORDER — ALBUTEROL SULFATE HFA 108 (90 BASE) MCG/ACT IN AERS: 2.0000 | INHALATION_SPRAY | RESPIRATORY_TRACT | Status: DC | PRN

## 2016-12-21 NOTE — Progress Notes (Signed)
Results for KARELYS, RISTINE (MRN [id number]) as of 12/21/2016 09:38  Ref. Range 12/20/2016 18:51 12/21/2016 00:36 12/21/2016 07:26  Glucose-Capillary Latest Ref Range: 65 - 99 mg/dL 794 (H) 801 (H) 655 (H)  Noted that patient was just discharged on 12/15/16. Noted that Lantus 17 units BID has been ordered.  Recommend adding Novolog SENSITIVE correction scale TID & HS and Novolog 4-5 units TID with meals if eating at least 50% of meal.  These are the insulin orders that she was on at the last admission earlier this month.    Smith Mince RN BSN CDE Diabetes Coordinator Pager: [phone]  8am-5pm

## 2016-12-21 NOTE — ED Notes (Signed)
Dr Patria Mane informed about blood sugar of 475.

## 2016-12-21 NOTE — ED Notes (Signed)
No respiratory or acute distress noted alert and oriented x 3 call light in reach hospital sitter watching pt.

## 2016-12-21 NOTE — Progress Notes (Signed)
LCSW consulted by TTS to follow case regarding psycho-social needs.  LCSW has phoned patient daughter as review of chart indicates she was just released from CIR at [facility] into daughters care with home health arranged.  Spoke with daughter:  Debra [name] (ph#  [phone])  Will call back at 12:45 when she has a break and can discuss care and plans for her mom.   Plan from CIR:  Went to daughters home.  (per Chart)  HHRN, PT, ST, SW referral placed with [facility].  Also , confirmed appointment for pt at [facility] on 10/31 @ 10:15 am.  Daughter aware and will be able to provide transport to appointment. Will follow up with daughter this afternoon.   Deretha Emory, MSW Clinical Social Work: Optician, dispensing Coverage for :  [phone]

## 2016-12-21 NOTE — ED Notes (Signed)
MD Juleen China called about blood sugar reading HI
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
## 2016-12-21 NOTE — Clinical Social Work Note (Signed)
Clinical Social Work Assessment  Patient Details  Name: Debra [name] MRN: [id number] Date of Birth: 1965-09-04  Date of referral:  12/21/16               Reason for consult:  [facility], Other (Comment Required) (Following with TTS)                Permission sought to share information with:  Case Manager, Family Supports Permission granted to share information::  Yes, Verbal Permission Granted  Name::        Agency::     Relationship::  Daughter  Contact Information:     Housing/Transportation Living arrangements for the past 2 months:  Single Family Home ([facility]IR) Source of Information:  Patient, Medical Team, Case Manager, Adult Children Patient Interpreter Needed:  None Criminal Activity/Legal Involvement Pertinent to Current Situation/Hospitalization:  No - Comment as needed Significant Relationships:  Adult Children, Other Family Members Lives with:  Adult Children Do you feel safe going back to the place where you live?  No Need for family participation in patient care:  Yes (Comment)  Care giving concerns:  Patient comes to [facility] ED for psychiatric intervention as she reports suicidal ideation.  Per daughter, patient has been verbalizing suicide for the past few days, but at the time no intent or plan until she came to the hospital.  Patient has been recently dx with MS and has worked in the past, but no current job recently.  Daughter reports patient has been falling recently, but at baseline can walk and use walker.  She just recently on 10/19 completed CIR for physical therapy. She was to discharge back with her long term boyfriend whom she has been living with since 2000, specifically in his home for the last 3 years after she fell outside his home and broke her leg.  While she was in CIR he stated that she could not return and he was done with her and the relationship. He has since started a relationship with patient's aunt. The boyfriend was her landlord as she had her  own home off of [address], but she cannot return to that home as he will not allow her to rent from him. She lacks income and insurance at this time as well.  She has applied for medicaid however her MS is not considered to be severe enough/progression of illness does not keep her from working, thus she was denied by [facility] [employer].  She has ability to care for herself, however per daughter at times chooses not to care for self at times.   Social Worker assessment / plan:  LCSW consulted and assessment completed. Discussed with Daughter who confirms patient cannot go home to her house as landlord found out about her living in apartment and now has police involved.   Patient has been asked not to return to boyfriends home. Patient cannot stay with son as he is in section 8 housing nor other family members as they cannot accommodate her needs.   Plan: Patient is currently under observation for psychiatric placement if warranted due to SI with plan to overdose. LCSW will follow acutely along and if cleared by psych will discuss shelter placement as this was the plan at CIR per notes, until patient was dropped by a taxi to daughters home which she was unaware was happening.  LCSW can follow up with resources at [facility] and shelters on Friday if patient is cleared and stable for discharge psychiatrically. Patient at this time does  not meet critiera for SNF due to no acute medical need resulting in 24 hour supervision or a payer source.  Will follow acutely and assist with needs/resources as they arise and disposition is determined.  Employment status:  Unemployed Health and safety inspector:  Self Pay (Medicaid Pending) PT Recommendations:  Not assessed at this time (Just completed CIR) Information / Referral to community resources:  Other (Comment Required) (still assessing, shelters, possibly IRC)  Patient/Family's Response to care:  Family voices understanding and inability to provide housing. Aware patient may go  to shelter.  Patient/Family's Understanding of and Emotional Response to Diagnosis, Current Treatment, and Prognosis:  Still determining.  Appears limited support and emotional response to patient needs. Patient currently depressed and upset regarding situation.   Emotional Assessment Appearance:  Appears stated age Attitude/Demeanor/Rapport:    Affect (typically observed):  Anxious, Guarded Orientation:  Oriented to Self, Oriented to Place, Oriented to  Time, Oriented to Situation Alcohol / Substance use:  Not Applicable Psych involvement (Current and /or in the community):  Yes (Comment) (currently under observation with pscyh in ED)  Discharge Needs  Concerns to be addressed:  Care Coordination, Basic Needs, Homelessness, Financial / Insurance Concerns, Discharge Planning Concerns Readmission within the last 30 days:  Yes Current discharge risk:  Chronically ill, Homeless, Psychiatric Illness Barriers to Discharge:  Other (observation in ED, no place to go post discharge)   Raye Sorrow, LCSW 12/21/2016, 1:03 PM

## 2016-12-21 NOTE — ED Provider Notes (Addendum)
Called by nursing because CBG>600. Doesn't seem to be overtly symptomatic with this. 10u novolog x1. Lantus previously ordered. Normally takes 10u of regular insulin TID with meals and this was also ordered. Requested repeat CBG in 30 min.    Raeford Razor, MD 12/21/16 1732  Will place IV. Bolus IVF. Additional insulin. If doesn't come down into more reasonable range after this then may repeat labs or move to higher acuity side.     Raeford Razor, MD 12/21/16 [phone]

## 2016-12-21 NOTE — ED Notes (Signed)
No respiratory or acute distress noted call light in reach resting in bed with eyes closed hospital sitter with pt.

## 2016-12-22 ENCOUNTER — Inpatient Hospital Stay
Admission: EM | Admit: 2016-12-22 | Discharge: 2017-01-01 | DRG: 885 | Disposition: A | Discharge status: Other Acute Inpatient Hospital | Payer: No Typology Code available for payment source | Source: Intra-hospital | Attending: Psychiatry | Admitting: Psychiatry

## 2016-12-22 ENCOUNTER — Encounter: Payer: Self-pay | Admitting: Psychiatry

## 2016-12-22 DIAGNOSIS — F332 Major depressive disorder, recurrent severe without psychotic features: Secondary | ICD-10-CM | POA: Diagnosis not present

## 2016-12-22 DIAGNOSIS — E785 Hyperlipidemia, unspecified: Secondary | ICD-10-CM | POA: Diagnosis present

## 2016-12-22 DIAGNOSIS — N319 Neuromuscular dysfunction of bladder, unspecified: Secondary | ICD-10-CM | POA: Diagnosis present

## 2016-12-22 DIAGNOSIS — G35 Multiple sclerosis: Secondary | ICD-10-CM

## 2016-12-22 DIAGNOSIS — Z590 Homelessness: Secondary | ICD-10-CM

## 2016-12-22 DIAGNOSIS — J45909 Unspecified asthma, uncomplicated: Secondary | ICD-10-CM | POA: Diagnosis present

## 2016-12-22 DIAGNOSIS — E1065 Type 1 diabetes mellitus with hyperglycemia: Secondary | ICD-10-CM | POA: Diagnosis not present

## 2016-12-22 DIAGNOSIS — F333 Major depressive disorder, recurrent, severe with psychotic symptoms: Principal | ICD-10-CM | POA: Diagnosis present

## 2016-12-22 DIAGNOSIS — E1042 Type 1 diabetes mellitus with diabetic polyneuropathy: Secondary | ICD-10-CM | POA: Diagnosis present

## 2016-12-22 DIAGNOSIS — Z79899 Other long term (current) drug therapy: Secondary | ICD-10-CM

## 2016-12-22 DIAGNOSIS — F39 Unspecified mood [affective] disorder: Secondary | ICD-10-CM

## 2016-12-22 DIAGNOSIS — M797 Fibromyalgia: Secondary | ICD-10-CM | POA: Diagnosis present

## 2016-12-22 DIAGNOSIS — F4310 Post-traumatic stress disorder, unspecified: Secondary | ICD-10-CM | POA: Diagnosis present

## 2016-12-22 DIAGNOSIS — N390 Urinary tract infection, site not specified: Secondary | ICD-10-CM | POA: Diagnosis present

## 2016-12-22 DIAGNOSIS — F419 Anxiety disorder, unspecified: Secondary | ICD-10-CM

## 2016-12-22 DIAGNOSIS — I10 Essential (primary) hypertension: Secondary | ICD-10-CM | POA: Diagnosis present

## 2016-12-22 DIAGNOSIS — R45851 Suicidal ideations: Secondary | ICD-10-CM | POA: Diagnosis present

## 2016-12-22 DIAGNOSIS — E10311 Type 1 diabetes mellitus with unspecified diabetic retinopathy with macular edema: Secondary | ICD-10-CM | POA: Diagnosis present

## 2016-12-22 DIAGNOSIS — Z8673 Personal history of transient ischemic attack (TIA), and cerebral infarction without residual deficits: Secondary | ICD-10-CM

## 2016-12-22 DIAGNOSIS — K219 Gastro-esophageal reflux disease without esophagitis: Secondary | ICD-10-CM | POA: Diagnosis present

## 2016-12-22 DIAGNOSIS — Z9181 History of falling: Secondary | ICD-10-CM

## 2016-12-22 DIAGNOSIS — B961 Klebsiella pneumoniae [K. pneumoniae] as the cause of diseases classified elsewhere: Secondary | ICD-10-CM | POA: Diagnosis present

## 2016-12-22 DIAGNOSIS — R339 Retention of urine, unspecified: Secondary | ICD-10-CM | POA: Diagnosis present

## 2016-12-22 DIAGNOSIS — Z91048 Other nonmedicinal substance allergy status: Secondary | ICD-10-CM | POA: Diagnosis not present

## 2016-12-22 DIAGNOSIS — G4700 Insomnia, unspecified: Secondary | ICD-10-CM | POA: Diagnosis present

## 2016-12-22 DIAGNOSIS — F341 Dysthymic disorder: Secondary | ICD-10-CM | POA: Diagnosis present

## 2016-12-22 DIAGNOSIS — F322 Major depressive disorder, single episode, severe without psychotic features: Secondary | ICD-10-CM | POA: Diagnosis not present

## 2016-12-22 DIAGNOSIS — G2581 Restless legs syndrome: Secondary | ICD-10-CM | POA: Diagnosis present

## 2016-12-22 DIAGNOSIS — R443 Hallucinations, unspecified: Secondary | ICD-10-CM

## 2016-12-22 DIAGNOSIS — Z880 Allergy status to penicillin: Secondary | ICD-10-CM

## 2016-12-22 DIAGNOSIS — E109 Type 1 diabetes mellitus without complications: Secondary | ICD-10-CM | POA: Diagnosis present

## 2016-12-22 DIAGNOSIS — R450 Nervousness: Secondary | ICD-10-CM

## 2016-12-22 LAB — CBG MONITORING, ED
GLUCOSE-CAPILLARY: 338 mg/dL — AB (ref 65–99)
GLUCOSE-CAPILLARY: 266 mg/dL — AB (ref 65–99)
Glucose-Capillary: 301 mg/dL — ABNORMAL HIGH (ref 65–99)
Glucose-Capillary: 288 mg/dL — ABNORMAL HIGH (ref 65–99)

## 2016-12-22 LAB — GLUCOSE, CAPILLARY
GLUCOSE-CAPILLARY: 301 mg/dL — AB (ref 65–99)
GLUCOSE-CAPILLARY: 412 mg/dL — AB (ref 65–99)

## 2016-12-22 LAB — GLUCOSE, RANDOM: GLUCOSE: 447 mg/dL — AB (ref 65–99)

## 2016-12-22 MED ORDER — TRAZODONE HCL 100 MG PO TABS
100.0000 mg | ORAL_TABLET | Freq: Every evening | ORAL | Status: DC | PRN
  Administered 2016-12-23 – 2016-12-24 (×2): 100 mg via ORAL
  Filled 2016-12-22 (×3): qty 1

## 2016-12-22 MED ORDER — ALBUTEROL SULFATE HFA 108 (90 BASE) MCG/ACT IN AERS
2.0000 | INHALATION_SPRAY | Freq: Four times a day (QID) | RESPIRATORY_TRACT | Status: DC | PRN
  Filled 2016-12-22: qty 6.7

## 2016-12-22 MED ORDER — METOPROLOL SUCCINATE ER 100 MG PO TB24
100.0000 mg | ORAL_TABLET | Freq: Every day | ORAL | Status: DC
  Administered 2016-12-22 – 2017-01-01 (×10): 100 mg via ORAL
  Filled 2016-12-22 (×2): qty 4
  Filled 2016-12-22: qty 1
  Filled 2016-12-22 (×2): qty 4
  Filled 2016-12-22: qty 1
  Filled 2016-12-22: qty 4
  Filled 2016-12-22: qty 1
  Filled 2016-12-22: qty 4
  Filled 2016-12-22: qty 1

## 2016-12-22 MED ORDER — ESCITALOPRAM OXALATE 10 MG PO TABS
5.0000 mg | ORAL_TABLET | Freq: Every day | ORAL | Status: DC
  Administered 2016-12-22: 5 mg via ORAL
  Filled 2016-12-22: qty 1

## 2016-12-22 MED ORDER — ETODOLAC 200 MG PO CAPS
200.0000 mg | ORAL_CAPSULE | Freq: Two times a day (BID) | ORAL | Status: DC
  Administered 2016-12-23 – 2017-01-01 (×19): 200 mg via ORAL
  Filled 2016-12-22 (×21): qty 1

## 2016-12-22 MED ORDER — GABAPENTIN 600 MG PO TABS
600.0000 mg | ORAL_TABLET | Freq: Three times a day (TID) | ORAL | Status: DC
  Administered 2016-12-22 – 2017-01-01 (×29): 600 mg via ORAL
  Filled 2016-12-22 (×28): qty 1

## 2016-12-22 MED ORDER — LISINOPRIL 20 MG PO TABS
20.0000 mg | ORAL_TABLET | Freq: Every day | ORAL | Status: DC
  Administered 2016-12-22 – 2016-12-25 (×4): 20 mg via ORAL
  Filled 2016-12-22 (×4): qty 1

## 2016-12-22 MED ORDER — PANTOPRAZOLE SODIUM 40 MG PO TBEC
40.0000 mg | DELAYED_RELEASE_TABLET | Freq: Every day | ORAL | Status: DC
  Administered 2016-12-22 – 2017-01-01 (×11): 40 mg via ORAL
  Filled 2016-12-22 (×11): qty 1

## 2016-12-22 MED ORDER — HYDROCHLOROTHIAZIDE 12.5 MG PO CAPS
12.5000 mg | ORAL_CAPSULE | Freq: Every day | ORAL | Status: DC
  Administered 2016-12-23 – 2016-12-25 (×3): 12.5 mg via ORAL
  Filled 2016-12-22 (×3): qty 1

## 2016-12-22 MED ORDER — ACETAMINOPHEN 325 MG PO TABS
650.0000 mg | ORAL_TABLET | Freq: Four times a day (QID) | ORAL | Status: DC | PRN
  Administered 2016-12-23 – 2016-12-28 (×4): 650 mg via ORAL
  Filled 2016-12-22 (×4): qty 2

## 2016-12-22 MED ORDER — AMLODIPINE BESYLATE 5 MG PO TABS
10.0000 mg | ORAL_TABLET | Freq: Every day | ORAL | Status: DC
  Administered 2016-12-22 – 2017-01-01 (×11): 10 mg via ORAL
  Filled 2016-12-22 (×11): qty 2

## 2016-12-22 MED ORDER — INSULIN ASPART 100 UNIT/ML ~~LOC~~ SOLN
0.0000 [IU] | Freq: Three times a day (TID) | SUBCUTANEOUS | Status: DC
  Administered 2016-12-23: 8 [IU] via SUBCUTANEOUS

## 2016-12-22 MED ORDER — ASPIRIN EC 81 MG PO TBEC
81.0000 mg | DELAYED_RELEASE_TABLET | Freq: Every day | ORAL | Status: DC
  Administered 2016-12-22 – 2017-01-01 (×11): 81 mg via ORAL
  Filled 2016-12-22 (×12): qty 1

## 2016-12-22 MED ORDER — MAGNESIUM HYDROXIDE 400 MG/5ML PO SUSP: 30.0000 mL | Freq: Every day | ORAL | Status: DC | PRN

## 2016-12-22 MED ORDER — INSULIN GLARGINE 100 UNIT/ML ~~LOC~~ SOLN
15.0000 [IU] | Freq: Two times a day (BID) | SUBCUTANEOUS | Status: DC
  Administered 2016-12-22 – 2016-12-25 (×6): 15 [IU] via SUBCUTANEOUS
  Filled 2016-12-22 (×8): qty 0.15

## 2016-12-22 MED ORDER — INSULIN ASPART 100 UNIT/ML ~~LOC~~ SOLN: 6.0000 [IU] | Freq: Three times a day (TID) | SUBCUTANEOUS | Status: DC

## 2016-12-22 MED ORDER — ATORVASTATIN CALCIUM 20 MG PO TABS
40.0000 mg | ORAL_TABLET | Freq: Every day | ORAL | Status: DC
  Administered 2016-12-22 – 2016-12-31 (×10): 40 mg via ORAL
  Filled 2016-12-22 (×10): qty 2

## 2016-12-22 MED ORDER — INSULIN ASPART 100 UNIT/ML ~~LOC~~ SOLN
0.0000 [IU] | Freq: Every day | SUBCUTANEOUS | Status: DC
  Administered 2016-12-22: 5 [IU] via SUBCUTANEOUS

## 2016-12-22 MED ORDER — INSULIN ASPART 100 UNIT/ML ~~LOC~~ SOLN
6.0000 [IU] | Freq: Three times a day (TID) | SUBCUTANEOUS | Status: DC
  Administered 2016-12-23 – 2016-12-25 (×7): 6 [IU] via SUBCUTANEOUS
  Filled 2016-12-22 (×2): qty 1

## 2016-12-22 MED ORDER — NITROGLYCERIN 0.4 MG SL SUBL: 0.4000 mg | SUBLINGUAL_TABLET | SUBLINGUAL | Status: DC | PRN

## 2016-12-22 MED ORDER — ALUM & MAG HYDROXIDE-SIMETH 200-200-20 MG/5ML PO SUSP: 30.0000 mL | ORAL | Status: DC | PRN

## 2016-12-22 NOTE — Progress Notes (Addendum)
LCSW following acutely with patient needs/psychiatric   Patient's son called this morning regarding concerns with his mother. [name]   Son reports he is very concerned that patient will commit suicide after reporting she is just tired of hitting barriers in getting help.  Reports she has never verbalized suicidal ideation, but feels this is secondary to her housing situation, limited support, loss of relationship, and struggle with finances, approval for disability.  Son reports he is involved with his mother but he is a full time Consulting civil engineer and single father. He lives in subsidized housing and unable to add mother to the lease due to his contract.  Son is available by phone and also working to help patient apply for medicaid/disability. Reports she has been applying off and on since 1995, but he has been working with [employer] to assist with legal issues and appeal of disability.  LCSW will attempt to have financial counselors assist with medicaid application.  12:40 PM LCSW is assisting patient with possibly applying for medicaid for her medical issues as they are chronic.  Discussed case with financial planners for assistance with medicaid application and if they have worked with patient in the past.  Financial counselors have seen patient, most recently reviewed her chart while she was on medical units at [facility] and were unable to assist patient due to medical documentation reflecting that patient has minimal mobility issues, not pregnant,no minor children, thus she was screened out due to not meeting criteria for disability at this time.  Patient could apply possibly for mental health disability in the future, but being this is patient's first admission related to MH she would be screened out as she has no other documentation related to mental health or admissions prior to this encounter.  LCSW will discuss with son regarding findings and barriers.  He is still able  to apply per his discretion, however he will be educated about his barriers with obtaining medicaid.     Will also complete VISPDAT for [facility] if she is willing and send to case manger in effort to get her on wait list for housing.  Patient is willing to complete this document indicating she is homeless and LCSW will send off to case manager with [facility].   Will follow up All information verbalized to TTS. Will be available and continue to assist. 2:55 PM VI-SPDAT completed and submitted to Benitta with [facility].  Debra [name], MSW Clinical Social Work: Optician, dispensing Coverage for :  [phone]

## 2016-12-22 NOTE — Progress Notes (Signed)
12/22/16  1634  Called [facility] to transport patient to [facility] [address].

## 2016-12-22 NOTE — Consult Note (Signed)
[facility] Face-to-Face Psychiatry Consult   Reason for Consult:  Suicide risk assessment  Referring Physician:  EDP Patient Identification: Debra [name] MRN:  [id number] Principal Diagnosis: MDD (major depressive episode), single episode, severe, no psychosis ([place]) Diagnosis:   Patient Active Problem List   Diagnosis Date Noted  . MDD (major depressive episode), single episode, severe, no psychosis ([place]) [F32.2] 12/22/2016  . Multiple joint pain [M25.50]   . HTN (hypertension) [I10]   . Acute pain of left knee [M25.562]   . Neurogenic bladder [N31.9]   . Acute lower UTI [N39.0]   . Labile blood pressure [R09.89]   . Acute blood loss anemia [D62]   . Leukocytosis [D72.829]   . Labile blood glucose [R73.09]   . Hypertensive crisis [I16.9]   . Hypoglycemia [E16.2]   . Poorly controlled type 2 diabetes mellitus with peripheral neuropathy (HCC) [E11.42, E11.65]   . Uncontrolled hypertension [I10]   . Sleep disturbance [G47.9]   . Multiple sclerosis ([place]) [G35] 11/22/2016  . Thoracic root lesion [G54.3] 11/22/2016  . MS (multiple sclerosis) ([place]) [G35]   . Neuropathic pain [M79.2]   . Uncontrolled type 2 diabetes mellitus with peripheral neuropathy (HCC) [E11.42, E11.65]   . Urinary retention [R33.9]   . Medically noncompliant [Z91.19]   . Leg weakness, bilateral [R29.898] 11/20/2016  . Diabetic ketoacidosis without coma associated with type 1 diabetes mellitus ([place]) [E10.10]   . Leg pain [M79.606]   . History of CVA (cerebrovascular accident) [Z86.73]   . 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 Benign essential HTN [I10]   . Hypertensive urgency [I16.0]   . AKI (acute kidney injury) ([place]) [N17.9]   . Fibromyalgia [M79.7] 11/07/2016  . Back pain [M54.9] 10/31/2016  . Stable angina pectoris ([place]) [I20.8]   . Vitamin D deficiency [E55.9] 08/31/2014  . Left Eye Macular Edema secondary to Diabetes Mellitus [E13.311] 07/24/2014   . Hyperlipidemia [E78.5] 10/06/2013  . Depression [F32.9] 10/16/2012  . Bilateral knee pain [M25.561, M25.562] 02/16/2012  . Chronic diarrhea [K52.9] 10/19/2010  . Peripheral neuropathy [G62.9] 08/19/2010  . 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 Hypertension [I10] 07/06/2010  . Asthma [J45.909] 07/06/2010  . Migraine [G43.909] 07/06/2010    Total Time spent with patient: 20 minutes  Subjective:   Debra [name] is a 51 y.o. female patient admitted with suicidal ideation in the setting of recent diagnosis of multiple sclerosis and homelessness.   HPI:   Debra [name] reports that she has a long history of depression and medications have not helped in the past. She is currently not taking any medications for her mood. She reports acute worsening of her mood over the past month due to recent diagnosis of multiple sclerosis. She also reports chronic suicidal ideations without intent but since her new diagnosis she reports worsening SI. She feels hopeless. She reports a lack of support.  She is unable to return to her previous house where she lived with her boyfriend and her daughter is unable to have her at her home. She is unable to work or drive anymore due to leg weakness. She was recently admitted to the inpatient rehab service (9/26-10/19). She does not have medical insurance or other forms of income. She denies substance use. She reports occasionally seeing shadows but is aware that they are not real. She denies AH.    Past Psychiatric History: She reports a history of chronic depression.   Risk  to Self: Suicidal Ideation: Yes-Currently Present Suicidal Intent: Yes-Currently Present Is patient at risk for suicide?: Yes Suicidal Plan?: Yes-Currently Present Specify Current Suicidal Plan: OD on rx meds Access to Means: Yes Specify Access to Suicidal Means: many rx meds What has been your use of drugs/alcohol within the last 12 months?: pt  denies Intentional Self Injurious Behavior: None Risk to Others: Homicidal Ideation: No Thoughts of Harm to Others: No Current Homicidal Intent: No Current Homicidal Plan: No Access to Homicidal Means: No History of harm to others?: No Assessment of Violence: None Noted Does patient have access to weapons?: No Criminal Charges Pending?: No Does patient have a court date: No Prior Inpatient Therapy: Prior Inpatient Therapy: No Prior Outpatient Therapy: Prior Outpatient Therapy: Yes Prior Therapy Dates: several years ago Reason for Treatment: depression Does patient have an ACCT team?: No Does patient have Intensive In-House Services?  : No Does patient have [facility] services? : No Does patient have P4CC services?: No  Past Medical History:  Past Medical History:  Diagnosis Date  . Adenomatous colonic polyps   . Anemia    2005  . Anxiety    1990  . Arthritis   . Asthma    2000  . Cataract   . Depression 1990  . Difficult intubation    narrow airway  . Gastroesophageal Reflux Disease (GERD)   . Heart murmur    Birth  . Hyperlipidemia    2005  . Hypertension    1998  . Internal hemorrhoids 04/24/06   on colonoscopy  . Migraine   . Neuropathy of the hands & feet   . Restless Leg Syndrome   . Right Ankle Fracture 10/06/2013  . Sleep paralysis   . Stable angina pectoris    2007: cath showing normal cors.   . Stroke 1990  . Type I Diabetes Mellitus 1988    Past Surgical History:  Procedure Laterality Date  . bilateral foot surgery    . BREAST SURGERY Left    biopsy left breast  . CARDIAC CATHETERIZATION  2007   around 2007 or 2008  . CESAREAN SECTION    . CHOLECYSTECTOMY    . COLONOSCOPY W/ BIOPSIES AND POLYPECTOMY    . DILATION AND CURETTAGE OF UTERUS  12/29/2010   Surgeon: Osborne Oman, MD;  Location: [facility];  Service: Gynecology  . ENDOMETRIAL ABLATION W/ NOVASURE N/A 12/2009  . EYE SURGERY    . HYSTEROSCOPY  12/29/2010   Procedure: HYSTEROSCOPY WITH  HYDROTHERMAL ABLATION;  Surgeon: Osborne Oman, MD;  Location: [facility];  Service: Gynecology;;  . IUD REMOVAL  12/29/2010   Procedure: INTRAUTERINE DEVICE (IUD) REMOVAL;  Surgeon: Osborne Oman, MD;  Location: [facility];  Service: Gynecology;  Laterality: N/A;  . LAPAROSCOPIC TUBAL LIGATION  12/29/2010   Procedure: LAPAROSCOPIC TUBAL LIGATION;  Surgeon: Osborne Oman, MD;  Location: [facility];  Service: Gynecology;  Laterality: Bilateral;  . ORIF ANKLE FRACTURE Right 10/09/2013   Procedure: Open reduction internal fixation right ankle;  Surgeon: Nita Sells, MD;  Location: [facility];  Service: Orthopedics;  Laterality: Right;  Open reduction internal fixation right ankle  . TRIGGER FINGER RELEASE     x 3   Family History:  Family History  Problem Relation Age of Onset  . Hypertension Mother   . Kidney disease Mother   . Hypertension Father   . Breast cancer Maternal Grandmother   . Prostate cancer Maternal Grandfather   . Ovarian cancer Paternal Grandmother   .  Prostate cancer Paternal Grandfather   . Colon cancer Maternal Uncle        Family history of malignant neoplasm of gastrointestinal tract   Family Psychiatric  History: Unknown  Social History:  History  Alcohol Use No     History  Drug Use No    Comment: drug addict    Social History   Social History  . Marital status: Divorced    Spouse name: N/A  . Number of children: 2  . Years of education: 14   Occupational History  . Unemployed    Social History Main Topics  . Smoking status: Never Smoker  . Smokeless tobacco: Never Used  . Alcohol use No  . Drug use: No     Comment: drug addict  . Sexual activity: Yes    Birth control/ protection: Other-see comments     Comment: pt states she had ablation in 2011   Other Topics Concern  . None   Social History Narrative   Lost medicaid about 2009 ish when her youngest child turned 66. Has adult children. Lives with boyfriend who financially supports her.  Has attempted to get disability but has been turned down.      2-3 caffeine drinks daily    Additional Social History: Currently homeless. Denies substance use.     Allergies:   Allergies  Allergen Reactions  . Penicillins Anaphylaxis, Nausea And Vomiting and Rash    Has patient had a PCN reaction causing immediate rash, facial/tongue/throat swelling, SOB or lightheadedness with hypotension: Yes Has patient had a PCN reaction causing severe rash involving mucus membranes or skin necrosis: Yes Has patient had a PCN reaction that required hospitalization No Has patient had a PCN reaction occurring within the last 10 years: Yes If all of the above answers are "NO", then may proceed with Cephalosporin use.   . Pollen Extract     Seasonal Allergies  . Tape Rash    Labs:  Results for orders placed or performed during the hospital encounter of 12/20/16 (from the past 48 hour(s))  CBG monitoring, ED     Status: Abnormal   Collection Time: 12/20/16  6:51 PM  Result Value Ref Range   Glucose-Capillary 142 (H) 65 - 99 mg/dL  Rapid urine drug screen (hospital performed)     Status: None   Collection Time: 12/20/16  7:41 PM  Result Value Ref Range   Opiates NONE DETECTED NONE DETECTED   Cocaine NONE DETECTED NONE DETECTED   Benzodiazepines NONE DETECTED NONE DETECTED   Amphetamines NONE DETECTED NONE DETECTED   Tetrahydrocannabinol NONE DETECTED NONE DETECTED   Barbiturates NONE DETECTED NONE DETECTED    Comment:        DRUG SCREEN FOR MEDICAL PURPOSES ONLY.  IF CONFIRMATION IS NEEDED FOR ANY PURPOSE, NOTIFY LAB WITHIN 5 DAYS.        LOWEST DETECTABLE LIMITS FOR URINE DRUG SCREEN Drug Class       Cutoff (ng/mL) Amphetamine      1000 Barbiturate      200 Benzodiazepine   810 Tricyclics       175 Opiates          300 Cocaine          300 THC              50   Comprehensive metabolic panel     Status: Abnormal   Collection Time: 12/20/16  7:55 PM  Result Value Ref Range    Sodium 137  135 - 145 mmol/L   Potassium 3.8 3.5 - 5.1 mmol/L   Chloride 99 (L) 101 - 111 mmol/L   CO2 28 22 - 32 mmol/L   Glucose, Bld 130 (H) 65 - 99 mg/dL   BUN 19 6 - 20 mg/dL   Creatinine, Ser 1.14 (H) 0.44 - 1.00 mg/dL   Calcium 9.6 8.9 - 10.3 mg/dL   Total Protein 7.5 6.5 - 8.1 g/dL   Albumin 3.7 3.5 - 5.0 g/dL   AST 22 15 - 41 U/L   ALT 27 14 - 54 U/L   Alkaline Phosphatase 128 (H) 38 - 126 U/L   Total Bilirubin 1.0 0.3 - 1.2 mg/dL   GFR calc non Af Amer 56 (L) >60 mL/min   GFR calc Af Amer >60 >60 mL/min    Comment: (NOTE) The eGFR has been calculated using the CKD EPI equation. This calculation has not been validated in all clinical situations. eGFR's persistently <60 mL/min signify possible Chronic Kidney Disease.    Anion gap 10 5 - 15  Ethanol     Status: None   Collection Time: 12/20/16  7:55 PM  Result Value Ref Range   Alcohol, Ethyl (B) <10 <10 mg/dL    Comment:        LOWEST DETECTABLE LIMIT FOR SERUM ALCOHOL IS 10 mg/dL FOR MEDICAL PURPOSES ONLY   Salicylate level     Status: None   Collection Time: 12/20/16  7:55 PM  Result Value Ref Range   Salicylate Lvl <2.2 2.8 - 30.0 mg/dL  Acetaminophen level     Status: Abnormal   Collection Time: 12/20/16  7:55 PM  Result Value Ref Range   Acetaminophen (Tylenol), Serum <10 (L) 10 - 30 ug/mL    Comment:        THERAPEUTIC CONCENTRATIONS VARY SIGNIFICANTLY. A RANGE OF 10-30 ug/mL MAY BE AN EFFECTIVE CONCENTRATION FOR MANY PATIENTS. HOWEVER, SOME ARE BEST TREATED AT CONCENTRATIONS OUTSIDE THIS RANGE. ACETAMINOPHEN CONCENTRATIONS >150 ug/mL AT 4 HOURS AFTER INGESTION AND >50 ug/mL AT 12 HOURS AFTER INGESTION ARE OFTEN ASSOCIATED WITH TOXIC REACTIONS.   cbc     Status: Abnormal   Collection Time: 12/20/16  7:55 PM  Result Value Ref Range   WBC 9.5 4.0 - 10.5 K/uL   RBC 4.01 3.87 - 5.11 MIL/uL   Hemoglobin 11.9 (L) 12.0 - 15.0 g/dL   HCT 35.2 (L) 36.0 - 46.0 %   MCV 87.8 78.0 - 100.0 fL   MCH 29.7  26.0 - 34.0 pg   MCHC 33.8 30.0 - 36.0 g/dL   RDW 14.2 11.5 - 15.5 %   Platelets 348 150 - 400 K/uL  POC Urine Pregnancy, ED (do NOT order at [facility])     Status: None   Collection Time: 12/20/16 10:23 PM  Result Value Ref Range   Preg Test, Ur NEGATIVE NEGATIVE    Comment:        THE SENSITIVITY OF THIS METHODOLOGY IS >24 mIU/mL   CBG monitoring, ED     Status: Abnormal   Collection Time: 12/21/16 12:36 AM  Result Value Ref Range   Glucose-Capillary 303 (H) 65 - 99 mg/dL  CBG monitoring, ED     Status: Abnormal   Collection Time: 12/21/16  7:26 AM  Result Value Ref Range   Glucose-Capillary 275 (H) 65 - 99 mg/dL  POC CBG, ED     Status: Abnormal   Collection Time: 12/21/16 12:54 PM  Result Value Ref Range   Glucose-Capillary 475 (  H) 65 - 99 mg/dL  POC CBG, ED     Status: Abnormal   Collection Time: 12/21/16  5:24 PM  Result Value Ref Range   Glucose-Capillary >600 (HH) 65 - 99 mg/dL  POC CBG, ED     Status: Abnormal   Collection Time: 12/21/16  6:00 PM  Result Value Ref Range   Glucose-Capillary >600 (HH) 65 - 99 mg/dL  CBG monitoring, ED     Status: Abnormal   Collection Time: 12/21/16  9:03 PM  Result Value Ref Range   Glucose-Capillary 400 (H) 65 - 99 mg/dL  CBG monitoring, ED     Status: Abnormal   Collection Time: 12/22/16  4:00 AM  Result Value Ref Range   Glucose-Capillary 338 (H) 65 - 99 mg/dL  CBG monitoring, ED     Status: Abnormal   Collection Time: 12/22/16  6:15 AM  Result Value Ref Range   Glucose-Capillary 301 (H) 65 - 99 mg/dL  POC CBG, ED     Status: Abnormal   Collection Time: 12/22/16  7:28 AM  Result Value Ref Range   Glucose-Capillary 288 (H) 65 - 99 mg/dL   Comment 1 Notify RN     Current Facility-Administered Medications  Medication Dose Route Frequency Provider Last Rate Last Dose  . acetaminophen (TYLENOL) tablet 650 mg  650 mg Oral Q4H PRN Nat Christen, MD   650 mg at 12/21/16 0908  . albuterol (PROVENTIL HFA;VENTOLIN HFA) 108 (90 Base)  MCG/ACT inhaler 2 puff  2 puff Inhalation Q4H PRN Larene Pickett, PA-C      . alum & mag hydroxide-simeth (MAALOX/MYLANTA) 200-200-20 MG/5ML suspension 30 mL  30 mL Oral Q6H PRN Nat Christen, MD      . amLODipine (NORVASC) tablet 10 mg  10 mg Oral Daily Larene Pickett, PA-C   10 mg at 12/22/16 0913  . aspirin EC tablet 81 mg  81 mg Oral Daily Larene Pickett, PA-C   81 mg at 12/22/16 0915  . atorvastatin (LIPITOR) tablet 40 mg  40 mg Oral Daily Larene Pickett, PA-C   40 mg at 12/22/16 0488  . bethanechol (URECHOLINE) tablet 50 mg  50 mg Oral TID Larene Pickett, PA-C   50 mg at 12/22/16 8916  . gabapentin (NEURONTIN) capsule 600 mg  600 mg Oral TID Larene Pickett, PA-C   600 mg at 12/22/16 0913  . hydrALAZINE (APRESOLINE) tablet 75 mg  75 mg Oral Q8H Larene Pickett, PA-C   75 mg at 12/22/16 9450  . insulin aspart (novoLOG) injection 10 Units  10 Units Subcutaneous TID WC Virgel Manifold, MD   10 Units at 12/22/16 0746  . insulin glargine (LANTUS) injection 17 Units  17 Units Subcutaneous BID Larene Pickett, PA-C   17 Units at 12/22/16 3888  . lisinopril (PRINIVIL,ZESTRIL) tablet 20 mg  20 mg Oral BID Larene Pickett, PA-C   20 mg at 12/22/16 0915  . meloxicam (MOBIC) tablet 15 mg  15 mg Oral q morning - 10a Larene Pickett, PA-C   15 mg at 12/22/16 0915  . metoprolol succinate (TOPROL-XL) 24 hr tablet 75 mg  75 mg Oral Daily Larene Pickett, PA-C   75 mg at 12/22/16 0915  . ondansetron (ZOFRAN) tablet 4 mg  4 mg Oral Q8H PRN Nat Christen, MD      . pantoprazole (PROTONIX) EC tablet 40 mg  40 mg Oral Daily Quincy Carnes M, PA-C   40 mg at 12/22/16  7619  . traZODone (DESYREL) tablet 50 mg  50 mg Oral QHS Larene Pickett, PA-C   50 mg at 12/21/16 2244  . zolpidem (AMBIEN) tablet 5 mg  5 mg Oral QHS PRN Nat Christen, MD       Current Outpatient Prescriptions  Medication Sig Dispense Refill  . amLODipine (NORVASC) 10 MG tablet Take 1 tablet (10 mg total) by mouth daily. 90 tablet 1  . aspirin EC 81  MG tablet Take 1 tablet (81 mg total) by mouth daily. 90 tablet 3  . atorvastatin (LIPITOR) 40 MG tablet Take 1 tablet (40 mg total) by mouth daily. 90 tablet 3  . gabapentin (NEURONTIN) 300 MG capsule Take 2 capsules (600 mg total) by mouth 3 (three) times daily. 180 capsule 0  . hydrALAZINE (APRESOLINE) 25 MG tablet Take 3 tablets (75 mg total) by mouth every 8 (eight) hours. 90 tablet 0  . insulin aspart (NOVOLOG FLEXPEN) 100 UNIT/ML FlexPen Inject 5 Units into the skin 3 (three) times daily with meals. (Patient taking differently: Inject 0-10 Units into the skin 3 (three) times daily with meals. ) 15 mL 11  . insulin glargine (LANTUS) 100 unit/mL SOPN Inject 0.17 mLs (17 Units total) into the skin 2 (two) times daily. 15 mL 11  . lisinopril (PRINIVIL,ZESTRIL) 20 MG tablet Take 1 tablet (20 mg total) by mouth 2 (two) times daily. 60 tablet 0  . Meloxicam 10 MG CAPS Take 15 mg by mouth every morning. 30 capsule 0  . oxyCODONE 10 MG TABS Take 1 tablet (10 mg total) by mouth every 4 (four) hours as needed for moderate pain. 20 tablet 0  . pantoprazole (PROTONIX) 40 MG tablet Take 1 tablet (40 mg total) by mouth daily. 30 tablet 0  . bethanechol (URECHOLINE) 25 MG tablet Take 50 mg by mouth 3 (three) times daily.  0  . bethanechol (URECHOLINE) 50 MG tablet Take 1 tablet (50 mg total) by mouth 3 (three) times daily. 30 tablet 0  . 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 The patient tests 4 times per day. 1 Device 0  . glucose blood (AGAMATRIX AMP TEST) test strip Use as instructed 100 each 12  . insulin glargine (LANTUS) 100 unit/mL SOPN Inject 0.17 mLs (17 Units total) into the skin 2 (two) times daily. (Patient not taking: Reported on 12/20/2016) 15 mL 11  . metoprolol succinate (TOPROL-XL) 25 MG 24 hr tablet Take 3 tablets (75 mg total) by mouth daily. 30 tablet 0  . NITROSTAT 0.4 MG SL tablet PLACE 1 TABLET UNDER TONGUE  AS NEEDED FOR CHEST PAIN EVERY 5 MINUTES X 3 MAX DOSES.  CALL 911 IF PAIN PERSISTS  0  . PROVENTIL HFA 108 (90 Base) MCG/ACT inhaler INHALE 1 TO 2 PUFFS BY MOUTH EVERY 6 HOURS AS NEEDED FOR COUGHING, WHEEZING, OR SHORTNESS OF BREATH  0  . tamsulosin (FLOMAX) 0.4 MG CAPS capsule Take 2 capsules (0.8 mg total) by mouth daily after breakfast. 30 capsule 0  . traZODone (DESYREL) 50 MG tablet Take 1 tablet (50 mg total) by mouth at bedtime. 30 tablet 0    Musculoskeletal: Strength & Muscle Tone: Patient reports muscle weakness in her lower extremities and was recently diagnosed with multiple sclerosis.  Gait & Station: Patient uses a walker to ambulate.  Patient leans: N/A  Psychiatric Specialty Exam: Physical Exam  Constitutional: She is oriented to person, place, and time. She appears  well-developed and well-nourished.  HENT:  Head: Normocephalic and atraumatic.  Neck: Normal range of motion.  Neurological: She is alert and oriented to person, place, and time.  Skin: No rash noted.    Review of Systems  Psychiatric/Behavioral: Positive for depression, hallucinations (Reports seeing shadows) and suicidal ideas. Negative for substance abuse. The patient is nervous/anxious.     Blood pressure (!) 183/86, pulse 70, temperature 98 F (36.7 C), temperature source Oral, resp. rate 18, height _0  (1.626 m), weight 68 kg (150 lb), SpO2 100 %.Body mass index is 25.75 kg/m.  General Appearance: AA female who appears her stated age, well groomed and wearing corrective lenses and sitting on the side of her bed while eating breakfast. NAD.   Eye Contact:  Good  Speech:  Normal Rate  Volume:  Normal  Mood:  Depressed  Affect:  Depressed  Thought Process:  Linear  Orientation:  Full (Time, Place, and Person)  Thought Content:  She discusses multiple stressors including medical conditions and homelessness. She reports seeing shadows but is aware that they are not real.   Suicidal Thoughts:  Yes.   with intent/plan  Homicidal Thoughts:  No  Memory:  Immediate;   Good Recent;   Good Remote;   Good  Judgement:  Fair  Insight:  Good  Psychomotor Activity:  Normal  Concentration:  Concentration: Good and Attention Span: Good  Recall:  Good  Fund of Knowledge:  Good  Language:  Good  Akathisia:  No  Handed:  Right  AIMS (if indicated):     Assets:  Desire for Improvement  ADL's:  Intact  Cognition:  WNL  Sleep:   Okay   Assessment: Debra [name] presents to the hospital with SI and plan to overdose on her medications in the setting of multiple social stressors. Mood and affect are depressed on interview. She warrants inpatient psychiatric hospitalization for safety and stabilization.   Treatment Plan Summary: MDD, recurrent, severe, without psychosis: Daily contact with patient to assess and evaluate symptoms and progress in treatment and Medication management  -Continue home medications while awaiting a bed.  -Consult SW to speak to patient about housing and other resources.  -Continue Trazodone 50 mg qhs for sleep and Ambien 5 mg qhs PRN is still not sleeping with Trazodone.   Disposition: Recommend psychiatric Inpatient admission when medically cleared.  Faythe Dingwall, DO 12/22/2016 9:55 AM

## 2016-12-22 NOTE — ED Provider Notes (Signed)
Still complains of suicidal ideation. Also complaining of hips hurting her. Normal exam of hips. Glucose 288 this morning. Awaiting psychiatric placement.   Dione Booze, MD 12/22/16 [phone]

## 2016-12-22 NOTE — Progress Notes (Signed)
12/22/16  1635 Called [facility] to give report. Spoke with Harriett Sine. Harriett Sine states the nurse is in a room with a patient and she will call me back. Left name and number with Harriett Sine for nurse to call back to get report.

## 2016-12-22 NOTE — BH Assessment (Signed)
[facility] Assessment Progress Note  Per Thedore Mins, MD, this pt requires psychiatric hospitalization.  Robinette Haines, Counselor reports that pt has been accepted to [facility] by Dr Jennet Maduro to Rm 310.  Pt has signed Voluntary Admission and Consent for Treatment, as well as Consent to Release Information to no one, and signed forms have been faxed to [phone].  Pt's nurse has been notified, and agrees to call report to [phone].  Pt is to be transported via Leota Sauers, MA Triage Specialist [phone]

## 2016-12-22 NOTE — BH Assessment (Signed)
Writer called to informed [facility] Theodoro Grist) that the patient need to be transported to [facility] after 7:30pm. He state he will give the message to the patient's nurse.

## 2016-12-22 NOTE — BH Assessment (Signed)
Patient has been accepted to [facility].  Accepting physician is Dr. Jennet Maduro.  Attending Physician will be Dr. Mikey College.  Patient has been assigned to room 310, by [facility] Charge Nurse [place].  Call report to [phone].  Representative/Transfer Coordinator is Luretha Eberly Patient pre-admitted by [facility] Patient Access Ivin Booty)   [facility] ER Staff Maisie Fus, H., TTS) made aware of acceptance.

## 2016-12-22 NOTE — Progress Notes (Signed)
Inpatient Diabetes Program Recommendations  AACE/ADA: New Consensus Statement on Inpatient Glycemic Control (2015)  Target Ranges:  Prepandial:   less than 140 mg/dL      Peak postprandial:   less than 180 mg/dL (1-2 hours)      Critically ill patients:  140 - 180 mg/dL   Results for Debra [name], Debra [name] (MRN [id number]) as of 12/22/2016 08:07  Ref. Range 12/21/2016 07:26 12/21/2016 12:54 12/21/2016 17:24 12/21/2016 18:00 12/21/2016 21:03  Glucose-Capillary Latest Ref Range: 65 - 99 mg/dL 621 (H) 308 (H) >657 (HH) >600 (HH) 400 (H)    Results for Debra [name], Debra [name] (MRN [id number]) as of 12/22/2016 08:07  Ref. Range 12/22/2016 04:00 12/22/2016 06:15 12/22/2016 07:28  Glucose-Capillary Latest Ref Range: 65 - 99 mg/dL 841 (H) 324 (H) 401 (H)    Home DM Meds: Lantus 17 units BID       Novolog 0-10 units TID  Current Insulin Orders: Lantus 17 units BID      Novolog 10 units TID       MD- Please add orders for Novolog Sensitive Correction Scale/ SSI (0-9 units) TID AC + HS  (Use Glycemic Control Order set)     --Will follow patient during hospitalization--  Ambrose Finland RN, MSN, CDE Diabetes Coordinator Inpatient Glycemic Control Team Team Pager: [phone] (8a-5p)

## 2016-12-22 NOTE — ED Notes (Signed)
Bed: FR10 Expected date:  Expected time:  Means of arrival:  Comments: Psychosis/IVC

## 2016-12-22 NOTE — Progress Notes (Signed)
51 year old female admitted with MDD without psychotic features.  Multiple stressors with newest stressor being recently diagnosed with MS.  Arrived on unit in scrubs, disheveled with notable body odor.  Gait unsteady and slow have to utilize a walker.   Body search and skin assessment performed with assistance of Gigi M, no contraband found.  Dry skin noted to feet, edema 1+ lower legs and feet.  Multiple surgery scars noted.  No open areas or ulcers noted.   Oriented to room.

## 2016-12-22 NOTE — BH Assessment (Deleted)
[facility] Assessment Progress Note  Per Juanetta Beets, DO, this pt does not require psychiatric hospitalization at this time.  Pt is to be discharged from [facility] with recommendation to continue treatment with [facility].  She is also to be referred [facility] for her substance abuse treatment needs.  These recommendations have been included in pt's discharge instructions.  Pt would also benefit from seeing Peer Support Specialists; Jonny Ruiz agrees to speak to pt.  Pt's nurse has been notified.  Doylene Canning, MA Triage Specialist [phone]

## 2016-12-23 DIAGNOSIS — F332 Major depressive disorder, recurrent severe without psychotic features: Secondary | ICD-10-CM

## 2016-12-23 LAB — LIPID PANEL
CHOLESTEROL: 105 mg/dL (ref 0–200)
HDL: 46 mg/dL (ref >40)
LDL CALC: 38 mg/dL (ref 0–99)
TRIGLYCERIDES: 103 mg/dL (ref <150)
Total CHOL/HDL Ratio: 2.3 RATIO
VLDL: 21 mg/dL (ref 0–40)

## 2016-12-23 LAB — GLUCOSE, CAPILLARY
GLUCOSE-CAPILLARY: 171 mg/dL — AB (ref 65–99)
GLUCOSE-CAPILLARY: 220 mg/dL — AB (ref 65–99)
GLUCOSE-CAPILLARY: 262 mg/dL — AB (ref 65–99)
GLUCOSE-CAPILLARY: 410 mg/dL — AB (ref 65–99)
Glucose-Capillary: 126 mg/dL — ABNORMAL HIGH (ref 65–99)

## 2016-12-23 LAB — HEMOGLOBIN A1C
HEMOGLOBIN A1C: 11.4 % — AB (ref 4.8–5.6)
Mean Plasma Glucose: 280.48 mg/dL

## 2016-12-23 LAB — TSH: TSH: 1.291 u[IU]/mL (ref 0.350–4.500)

## 2016-12-23 MED ORDER — INSULIN ASPART 100 UNIT/ML ~~LOC~~ SOLN
0.0000 [IU] | Freq: Three times a day (TID) | SUBCUTANEOUS | Status: DC
  Administered 2016-12-23: 1 [IU] via SUBCUTANEOUS
  Administered 2016-12-23: 3 [IU] via SUBCUTANEOUS
  Administered 2016-12-24: 7 [IU] via SUBCUTANEOUS
  Administered 2016-12-24 (×2): 3 [IU] via SUBCUTANEOUS
  Administered 2016-12-25: 9 [IU] via SUBCUTANEOUS
  Administered 2016-12-25: 2 [IU] via SUBCUTANEOUS
  Administered 2016-12-25: 1 [IU] via SUBCUTANEOUS
  Administered 2016-12-26 (×2): 2 [IU] via SUBCUTANEOUS
  Administered 2016-12-26: 3 [IU] via SUBCUTANEOUS
  Administered 2016-12-27 (×3): 5 [IU] via SUBCUTANEOUS
  Administered 2016-12-28: 2 [IU] via SUBCUTANEOUS
  Administered 2016-12-28: 3 [IU] via SUBCUTANEOUS
  Administered 2016-12-28: 6 [IU] via SUBCUTANEOUS
  Administered 2016-12-29: 2 [IU] via SUBCUTANEOUS
  Administered 2016-12-30: 1 [IU] via SUBCUTANEOUS
  Administered 2016-12-30: 3 [IU] via SUBCUTANEOUS
  Administered 2016-12-30: 1 [IU] via SUBCUTANEOUS
  Administered 2016-12-31: 2 [IU] via SUBCUTANEOUS
  Administered 2016-12-31: 1 [IU] via SUBCUTANEOUS
  Filled 2016-12-23 (×8): qty 1

## 2016-12-23 MED ORDER — INSULIN ASPART 100 UNIT/ML ~~LOC~~ SOLN
0.0000 [IU] | Freq: Every day | SUBCUTANEOUS | Status: DC
Start: 2016-12-23 — End: 2017-01-01
  Administered 2016-12-24: 3 [IU] via SUBCUTANEOUS
  Administered 2016-12-25: 2 [IU] via SUBCUTANEOUS
  Administered 2016-12-28 – 2016-12-29 (×2): 5 [IU] via SUBCUTANEOUS
  Filled 2016-12-23 (×5): qty 1

## 2016-12-23 MED ORDER — INSULIN ASPART 100 UNIT/ML ~~LOC~~ SOLN: 0.0000 [IU] | Freq: Three times a day (TID) | SUBCUTANEOUS | Status: DC

## 2016-12-23 MED ORDER — DULOXETINE HCL 20 MG PO CPEP
20.0000 mg | ORAL_CAPSULE | Freq: Every day | ORAL | Status: DC
  Administered 2016-12-23 – 2016-12-24 (×2): 20 mg via ORAL
  Filled 2016-12-23 (×2): qty 1

## 2016-12-23 NOTE — [facility] Group Notes (Signed)
[facility] Group Notes:  (Nursing/MHT/Case Management/Adjunct)  Date:  12/23/2016  Time:  4:30 AM  Type of Therapy:  Psychoeducational Skills  Participation Level:  Did Not Attend  Summary of Progress/Problems:  Debra [name] 12/23/2016, 4:30 AM

## 2016-12-23 NOTE — Progress Notes (Signed)
Inpatient Diabetes Program Recommendations  AACE/ADA: New Consensus Statement on Inpatient Glycemic Control (2015)  Target Ranges:  Prepandial:   less than 140 mg/dL      Peak postprandial:   less than 180 mg/dL (1-2 hours)      Critically ill patients:  140 - 180 mg/dL   Lab Results  Component Value Date   GLUCAP 262 (H) 12/23/2016   HGBA1C >14.0 10/31/2016    Review of Glycemic Control  Inpatient Diabetes Program Recommendations:   MD- Please consider change orders for Novolog Sensitive Correction Scale/ SSI (0-9 units) TID AC + HS.  Thank you, Debra [name]. Ollen Rao, RN, MSN, CDE  Diabetes Coordinator Inpatient Glycemic Control Team Team Pager [phone] (8am-5pm) 12/23/2016 9:04 AM

## 2016-12-23 NOTE — Plan of Care (Signed)
Problem: Safety: Goal: Ability to disclose and discuss suicidal ideas will improve Outcome: Progressing Patient is able to discuss suicidal thoughts with staff; she is able to contract for safety on the unit.

## 2016-12-23 NOTE — Progress Notes (Signed)
Patient ambulated to day room with staff's assistance.  She was able to use her walker with no problem.  When she got to day room, she stated "I cannot sit in those chairs.  They are too low."  Patient was encouraged to sit down and eat her snack and staff would assist her when she wanted to go back to room.  Patient was agreeable to that.

## 2016-12-23 NOTE — H&P (Signed)
Psychiatric Admission Assessment Adult  Patient Identification: Debra [name] MRN:  [id number] Date of Evaluation:  12/23/2016 Chief Complaint:  depression Principal Diagnosis: Major depressive disorder, single episode, severe without psychosis (HCC) Diagnosis:   Patient Active Problem List   Diagnosis Date Noted  . Major depressive disorder, single episode, severe without psychosis (HCC) [F32.2] 12/22/2016  . Multiple joint pain [M25.50]   . HTN (hypertension) [I10]   . Acute pain of left knee [M25.562]   . Neurogenic bladder [N31.9]   . Acute lower UTI [N39.0]   . Labile blood pressure [R09.89]   . Acute blood loss anemia [D62]   . Leukocytosis [D72.829]   . Labile blood glucose [R73.09]   . Hypertensive crisis [I16.9]   . Hypoglycemia [E16.2]   . Poorly controlled type 2 diabetes mellitus with peripheral neuropathy (HCC) [E11.42, E11.65]   . Uncontrolled hypertension [I10]   . Sleep disturbance [G47.9]   . Multiple sclerosis (HCC) [G35] 11/22/2016  . Thoracic root lesion [G54.3] 11/22/2016  . MS (multiple sclerosis) (HCC) [G35]   . Neuropathic pain [M79.2]   . Uncontrolled type 2 diabetes mellitus with peripheral neuropathy (HCC) [E11.42, E11.65]   . Urinary retention [R33.9]   . Medically noncompliant [Z91.19]   . Leg weakness, bilateral [R29.898] 11/20/2016  . Diabetic ketoacidosis without coma associated with type 1 diabetes mellitus (HCC) [E10.10]   . Leg pain [M79.606]   . History of CVA (cerebrovascular accident) [Z86.73]   . 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 Benign essential HTN [I10]   . Hypertensive urgency [I16.0]   . AKI (acute kidney injury) (HCC) [N17.9]   . Fibromyalgia [M79.7] 11/07/2016  . Back pain [M54.9] 10/31/2016  . Stable angina pectoris (HCC) [I20.8]   . Vitamin D deficiency [E55.9] 08/31/2014  . 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  Hyperlipidemia [E78.5] 10/06/2013  . Depression [F32.9] 10/16/2012  . Bilateral knee pain [M25.561, M25.562] 02/16/2012  . Chronic diarrhea [K52.9] 10/19/2010  . Peripheral neuropathy [G62.9] 08/19/2010  . Type 1 diabetes mellitus (HCC) [E10.9] 07/06/2010  . Hypertension [I10] 07/06/2010  . Asthma [J45.909] 07/06/2010  . Migraine [G43.909] 07/06/2010   History of Present Illness: " I have MS , I need walker, cant' drive" Debra [name] is a 51 y.o. female patient referred by Lydia Guiles, admitted voluntarily. Pt admitted with suicidal ideation with plan to OD on rx meds  in the setting of multiple stressors including recent diagnosis of multiple sclerosis. Pt reports that she  has a long history of depression ( more than 10 yrs) , getting worse for last 1 month with suicidal ideation . She is currently not taking any medications for her mood, although hx of tx with prozac and cymbalta. She reports worsening of her mood over the past month due to recent diagnosis of multiple sclerosis, needing walker to ambulate, ex boyfriend unable to take care of her, cannot go back with her ex boyfriend upon d/c, pending homelessness. . She reports worsening SI. She feels hopeless,  lack of support.  She is unable to return to her previous house where she lived with her ex boyfriend and her daughter is unable to have her at her home. She is unable to work or drive anymore due to leg weakness. She was recently admitted to the inpatient rehab service (9/26-10/19).  She denies substance use. She denies AVH, paranoia.   Pt is depressed, decreased PMA, poor  eye contact, rating is 10/10 depression. Endorses anhedonia, poor sleep and appetite. Admits  thoughts of SI by overdosing on her meds, but contracts for safety.   Associated Signs/Symptoms: Depression Symptoms:  depressed mood, anhedonia, insomnia, hopelessness, suicidal thoughts with specific plan, (Hypo) Manic Symptoms:   Anxiety Symptoms:   worries Psychotic Symptoms:  denies PTSD Symptoms:  Total Time spent with patient: 1 hour  Past Psychiatric History:   chronic depression  Is the patient at risk to self? Yes.    Has the patient been a risk to self in the past 6 months? Yes.    Has the patient been a risk to self within the distant past? No.  Is the patient a risk to others? No.  Has the patient been a risk to others in the past 6 months? No.  Has the patient been a risk to others within the distant past? No.   Prior Inpatient Therapy:   Prior Outpatient Therapy:    Alcohol Screening: Patient refused Alcohol Screening Tool: Yes 1. How often do you have a drink containing alcohol?: Never 9. Have you or someone else been injured as a result of your drinking?: No 10. Has a relative or friend or a doctor or another health worker been concerned about your drinking or suggested you cut down?: No Alcohol Use Disorder Identification Test Final Score (AUDIT): 0 Substance Abuse History in the last 12 months:  No. Consequences of Substance Abuse: NA Previous Psychotropic Medications: Yes  Psychological Evaluations: No  Past Medical History:  Past Medical History:  Diagnosis Date  . Adenomatous colonic polyps   . Anemia    2005  . Anxiety    1990  . Arthritis   . Asthma    2000  . Cataract   . Depression 1990  . Difficult intubation    narrow airway  . Gastroesophageal Reflux Disease (GERD)   . Heart murmur    Birth  . Hyperlipidemia    2005  . Hypertension    1998  . Internal hemorrhoids 04/24/06   on colonoscopy  . Migraine   . Neuropathy of the hands & feet   . Restless Leg Syndrome   . Right Ankle Fracture 10/06/2013  . Sleep paralysis   . Stable angina pectoris    2007: cath showing normal cors.   . Stroke 1990  . Type I Diabetes Mellitus 1988    Past Surgical History:  Procedure Laterality Date  . bilateral foot surgery    . BREAST SURGERY Left    biopsy left breast  . CARDIAC  CATHETERIZATION  2007   around 2007 or 2008  . CESAREAN SECTION    . CHOLECYSTECTOMY    . COLONOSCOPY W/ BIOPSIES AND POLYPECTOMY    . DILATION AND CURETTAGE OF UTERUS  12/29/2010   Surgeon: Tereso Newcomer, MD;  Location: [facility];  Service: Gynecology  . ENDOMETRIAL ABLATION W/ NOVASURE N/A 12/2009  . EYE SURGERY    . HYSTEROSCOPY  12/29/2010   Procedure: HYSTEROSCOPY WITH HYDROTHERMAL ABLATION;  Surgeon: Tereso Newcomer, MD;  Location: [facility];  Service: Gynecology;;  . IUD REMOVAL  12/29/2010   Procedure: INTRAUTERINE DEVICE (IUD) REMOVAL;  Surgeon: Tereso Newcomer, MD;  Location: [facility];  Service: Gynecology;  Laterality: N/A;  . LAPAROSCOPIC TUBAL LIGATION  12/29/2010   Procedure: LAPAROSCOPIC TUBAL LIGATION;  Surgeon: Tereso Newcomer, MD;  Location: [facility];  Service: Gynecology;  Laterality: Bilateral;  . ORIF ANKLE FRACTURE Right 10/09/2013  Procedure: Open reduction internal fixation right ankle;  Surgeon: Mable Paris, MD;  [facility];  Service: Orthopedics;  Laterality: Right;  Open reduction internal fixation right ankle  . TRIGGER FINGER RELEASE     x 3   Family History:  Family History  Problem Relation Age of Onset  . Hypertension Mother   . Kidney disease Mother   . Hypertension Father   . Breast cancer Maternal Grandmother   . Prostate cancer Maternal Grandfather   . Ovarian cancer Paternal Grandmother   . Prostate cancer Paternal Grandfather   . Colon cancer Maternal Uncle        Family history of malignant neoplasm of gastrointestinal tract   Family Psychiatric  History: denies Tobacco Screening: Have you used any form of tobacco in the last 30 days? (Cigarettes, Smokeless Tobacco, Cigars, and/or Pipes): No Social History:  History  Alcohol Use No     History  Drug Use No    Comment: drug addict    Additional Social History:                           Allergies:   Allergies  Allergen Reactions  . Penicillins Anaphylaxis,  Nausea And Vomiting and Rash    Has patient had a PCN reaction causing immediate rash, facial/tongue/throat swelling, SOB or lightheadedness with hypotension: Yes Has patient had a PCN reaction causing severe rash involving mucus membranes or skin necrosis: Yes Has patient had a PCN reaction that required hospitalization No Has patient had a PCN reaction occurring within the last 10 years: Yes If all of the above answers are "NO", then may proceed with Cephalosporin use.   . Pollen Extract     Seasonal Allergies  . Tape Rash   Lab Results:  Results for orders placed or performed during the hospital encounter of 12/22/16 (from the past 48 hour(s))  Glucose, capillary     Status: Abnormal   Collection Time: 12/22/16  6:20 PM  Result Value Ref Range   Glucose-Capillary 301 (H) 65 - 99 mg/dL  Glucose, capillary     Status: Abnormal   Collection Time: 12/22/16  8:43 PM  Result Value Ref Range   Glucose-Capillary 412 (H) 65 - 99 mg/dL  Glucose, random     Status: Abnormal   Collection Time: 12/22/16  9:30 PM  Result Value Ref Range   Glucose, Bld 447 (H) 65 - 99 mg/dL  Glucose, capillary     Status: Abnormal   Collection Time: 12/22/16 11:57 PM  Result Value Ref Range   Glucose-Capillary 410 (H) 65 - 99 mg/dL   Comment 1 Notify RN   Glucose, capillary     Status: Abnormal   Collection Time: 12/23/16  6:52 AM  Result Value Ref Range   Glucose-Capillary 262 (H) 65 - 99 mg/dL  Lipid panel     Status: None   Collection Time: 12/23/16  7:12 AM  Result Value Ref Range   Cholesterol 105 0 - 200 mg/dL   Triglycerides 161 <096 mg/dL   HDL 46 >04 mg/dL   Total CHOL/HDL Ratio 2.3 RATIO   VLDL 21 0 - 40 mg/dL   LDL Cholesterol 38 0 - 99 mg/dL    Comment:        Total Cholesterol/HDL:CHD Risk Coronary Heart Disease Risk Table                     Men   Women  1/2 Average Risk   3.4   3.3  Average Risk       5.0   4.4  2 X Average Risk   9.6   7.1  3 X Average Risk  23.4   11.0         Use the calculated Patient Ratio above and the CHD Risk Table to determine the patient's CHD Risk.        ATP III CLASSIFICATION (LDL):  <100     mg/dL   Optimal  161-096100-129  mg/dL   Near or Above                    Optimal  130-159  mg/dL   Borderline  045-409160-189  mg/dL   High  >811>190     mg/dL   Very High   TSH     Status: None   Collection Time: 12/23/16  7:12 AM  Result Value Ref Range   TSH 1.291 0.350 - 4.500 uIU/mL    Comment: Performed by a 3rd Generation assay with a functional sensitivity of <=0.01 uIU/mL.    Blood Alcohol level:  Lab Results  Component Value Date   ETH <10 12/20/2016   ETH  12/09/2006    <5        LOWEST DETECTABLE LIMIT FOR SERUM ALCOHOL IS 11 mg/dL FOR MEDICAL PURPOSES ONLY    Metabolic Disorder Labs:  Lab Results  Component Value Date   HGBA1C >14.0 10/31/2016   No results found for: PROLACTIN Lab Results  Component Value Date   CHOL 105 12/23/2016   TRIG 103 12/23/2016   HDL 46 12/23/2016   CHOLHDL 2.3 12/23/2016   VLDL 21 12/23/2016   LDLCALC 38 12/23/2016   LDLCALC 112 (H) 04/21/2015    Current Medications: Current Facility-Administered Medications  Medication Dose Route Frequency Provider Last Rate Last Dose  . acetaminophen (TYLENOL) tablet 650 mg  650 mg Oral Q6H PRN Pucilowska, Jolanta B, MD      . albuterol (PROVENTIL HFA;VENTOLIN HFA) 108 (90 Base) MCG/ACT inhaler 2 puff  2 puff Inhalation Q6H PRN Pucilowska, Jolanta B, MD      . alum & mag hydroxide-simeth (MAALOX/MYLANTA) 200-200-20 MG/5ML suspension 30 mL  30 mL Oral Q4H PRN Pucilowska, Jolanta B, MD      . amLODipine (NORVASC) tablet 10 mg  10 mg Oral Daily Pucilowska, Jolanta B, MD   10 mg at 12/23/16 0727  . aspirin EC tablet 81 mg  81 mg Oral Daily Pucilowska, Jolanta B, MD   81 mg at 12/23/16 0729  . atorvastatin (LIPITOR) tablet 40 mg  40 mg Oral q1800 Pucilowska, Jolanta B, MD   40 mg at 12/22/16 1834  . etodolac (LODINE) capsule 200 mg  200 mg Oral BID Pucilowska,  Jolanta B, MD   200 mg at 12/23/16 0724  . gabapentin (NEURONTIN) tablet 600 mg  600 mg Oral TID Pucilowska, Jolanta B, MD   600 mg at 12/23/16 0727  . hydrochlorothiazide (MICROZIDE) capsule 12.5 mg  12.5 mg Oral Daily Pucilowska, Jolanta B, MD   12.5 mg at 12/23/16 0727  . insulin aspart (novoLOG) injection 0-5 Units  0-5 Units Subcutaneous QHS Beverly SessionsSubedi, Xaden Kaufman, MD      . insulin aspart (novoLOG) injection 0-9 Units  0-9 Units Subcutaneous TID WC Beverly SessionsSubedi, Bastian Andreoli, MD      . insulin aspart (novoLOG) injection 6 Units  6 Units Subcutaneous TID WC Pucilowska, Jolanta B, MD   6 Units at 12/23/16  1610  . insulin glargine (LANTUS) injection 15 Units  15 Units Subcutaneous BID Pucilowska, Jolanta B, MD   15 Units at 12/23/16 0805  . lisinopril (PRINIVIL,ZESTRIL) tablet 20 mg  20 mg Oral Daily Pucilowska, Jolanta B, MD   20 mg at 12/23/16 0727  . magnesium hydroxide (MILK OF MAGNESIA) suspension 30 mL  30 mL Oral Daily PRN Pucilowska, Jolanta B, MD      . metoprolol succinate (TOPROL-XL) 24 hr tablet 100 mg  100 mg Oral Daily Pucilowska, Jolanta B, MD   100 mg at 12/23/16 0727  . nitroGLYCERIN (NITROSTAT) SL tablet 0.4 mg  0.4 mg Sublingual Q5 min PRN Pucilowska, Jolanta B, MD      . pantoprazole (PROTONIX) EC tablet 40 mg  40 mg Oral Daily Pucilowska, Jolanta B, MD   40 mg at 12/23/16 0727  . traZODone (DESYREL) tablet 100 mg  100 mg Oral QHS PRN Pucilowska, Jolanta B, MD       PTA Medications: Prescriptions Prior to Admission  Medication Sig Dispense Refill Last Dose  . amLODipine (NORVASC) 10 MG tablet Take 1 tablet (10 mg total) by mouth daily. 90 tablet 1 Past Week at Unknown time  . aspirin EC 81 MG tablet Take 1 tablet (81 mg total) by mouth daily. 90 tablet 3 Past Week at Unknown time  . atorvastatin (LIPITOR) 40 MG tablet Take 1 tablet (40 mg total) by mouth daily. 90 tablet 3 Past Week at Unknown time  . bethanechol (URECHOLINE) 25 MG tablet Take 50 mg by mouth 3 (three) times daily.  0  12/15/2016 at unknown time  . bethanechol (URECHOLINE) 50 MG tablet Take 1 tablet (50 mg total) by mouth 3 (three) times daily. 30 tablet 0 12/15/2016 at unknown time  . 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 The patient tests 4 times per day. 1 Device 0   . gabapentin (NEURONTIN) 300 MG capsule Take 2 capsules (600 mg total) by mouth 3 (three) times daily. 180 capsule 0 Past Week at Unknown time  . glucose blood (AGAMATRIX AMP TEST) test strip Use as instructed 100 each 12   . hydrALAZINE (APRESOLINE) 25 MG tablet Take 3 tablets (75 mg total) by mouth every 8 (eight) hours. 90 tablet 0 Past Week at Unknown time  . insulin aspart (NOVOLOG FLEXPEN) 100 UNIT/ML FlexPen Inject 5 Units into the skin 3 (three) times daily with meals. (Patient taking differently: Inject 0-10 Units into the skin 3 (three) times daily with meals. ) 15 mL 11 12/20/2016 at Unknown time  . insulin glargine (LANTUS) 100 unit/mL SOPN Inject 0.17 mLs (17 Units total) into the skin 2 (two) times daily. 15 mL 11 12/20/2016 at Unknown time  . insulin glargine (LANTUS) 100 unit/mL SOPN Inject 0.17 mLs (17 Units total) into the skin 2 (two) times daily. (Patient not taking: Reported on 12/20/2016) 15 mL 11 Not Taking at Unknown time  . lisinopril (PRINIVIL,ZESTRIL) 20 MG tablet Take 1 tablet (20 mg total) by mouth 2 (two) times daily. 60 tablet 0 Past Week at Unknown time  . Meloxicam 10 MG CAPS Take 15 mg by mouth every morning. 30 capsule 0 Past Week at Unknown time  . metoprolol succinate (TOPROL-XL) 25 MG 24 hr tablet Take 3 tablets (75 mg total) by mouth daily. 30 tablet 0 12/15/2016 at unknown time  . NITROSTAT 0.4 MG SL tablet PLACE 1 TABLET UNDER TONGUE AS NEEDED FOR CHEST  PAIN EVERY 5 MINUTES X 3 MAX DOSES.  CALL 911 IF PAIN PERSISTS  0 unknown  . oxyCODONE 10 MG TABS Take 1 tablet (10 mg total) by mouth every 4 (four) hours as needed for moderate  pain. 20 tablet 0 Past Week at Unknown time  . pantoprazole (PROTONIX) 40 MG tablet Take 1 tablet (40 mg total) by mouth daily. 30 tablet 0 Past Week at Unknown time  . PROVENTIL HFA 108 (90 Base) MCG/ACT inhaler INHALE 1 TO 2 PUFFS BY MOUTH EVERY 6 HOURS AS NEEDED FOR COUGHING, WHEEZING, OR SHORTNESS OF BREATH  0 unknown  . tamsulosin (FLOMAX) 0.4 MG CAPS capsule Take 2 capsules (0.8 mg total) by mouth daily after breakfast. 30 capsule 0 12/15/2016 at unknown itme  . traZODone (DESYREL) 50 MG tablet Take 1 tablet (50 mg total) by mouth at bedtime. 30 tablet 0     Musculoskeletal: Strength & Muscle Tone: decreased Gait & Station: unsteady Patient leans:   Psychiatric Specialty Exam: Physical Exam  Nursing note and vitals reviewed.   ROS  Blood pressure (!) 175/79, pulse 75, temperature 98 F (36.7 C), temperature source Oral, resp. rate 18, height 5\' 4"  (1.626 m), weight 68 kg (150 lb), SpO2 99 %.Body mass index is 25.75 kg/m.  General Appearance:  appears her stated age, poorly  groomed and wearing corrective lenses and sitting on the side of her bed   Eye Contact:  poor  Speech:  slow  Volume:  soft  Mood:  Depressed  Affect:  Depressed  Thought Process:  Linear, concrete  Orientation:  Full (Time, Place, and Person)  Thought Content:  She discusses multiple stressors including medical conditions and homelessness.  hopelessness  Suicidal Thoughts:  Yes.  with plan  Homicidal Thoughts:  No  Memory:  Immediate;   Good Recent;   Good Remote;   Good  Judgement:  limited  Insight:  fair  Psychomotor Activity:  slow  Concentration:  Concentration: fair and Attention Span: fair  Recall:  Good  Fund of Knowledge:  Good  Language:  Good  Akathisia:  No  Handed:    AIMS (if indicated):     Assets:  Desire for Improvement  ADL's:  Intact  Cognition:  WNL  Sleep-6.3       Treatment Plan Summary: Daily contact with patient to assess and evaluate symptoms and progress in  treatment and Medication management.  Debra [name] is a 51 y.o. female patient referred by Lydia Guiles, admitted voluntarily. Pt admitted with suicidal ideation with plan to OD on rx meds  in the setting of multiple stressors including recent diagnosis of multiple sclerosis. Pt not on antidepressant recently.  Will start Cymbalta.  Manage medical issues as appropriate. Diabetes consult recommending - Novolog Sensitive Correction Scale/ SSI (0-9 units) TID AC + HS. Voluntary status.    Observation Level/Precautions:  Fall 15 minute checks  Laboratory:  CBC Chemistry Profile HCG UDS UA, EKG  Psychotherapy:    Medications:    Consultations:    Discharge Concerns:    Estimated LOS:  Other:     Physician Treatment Plan for Primary Diagnosis: Major depressive disorder, single episode, severe without psychosis (HCC) Long Term Goal(s): Improvement in symptoms so as ready for discharge  Short Term Goals: Ability to identify changes in lifestyle to reduce recurrence of condition will improve, Ability to verbalize feelings will improve, Ability to disclose and discuss suicidal ideas, Ability to demonstrate self-control will improve, Ability to identify and develop  effective coping behaviors will improve, Ability to maintain clinical measurements within normal limits will improve, Compliance with prescribed medications will improve and Ability to identify triggers associated with substance abuse/mental health issues will improve  Physician Treatment Plan for Secondary Diagnosis: Principal Problem:   Major depressive disorder, single episode, severe without psychosis (HCC) Active Problems:   Type 1 diabetes mellitus (HCC)   Hypertension   Hyperlipidemia   MS (multiple sclerosis) (HCC)  Long Term Goal(s): Improvement in symptoms so as ready for discharge  Short Term Goals: Ability to identify changes in lifestyle to reduce recurrence of condition will improve, Ability to verbalize feelings  will improve, Ability to disclose and discuss suicidal ideas, Ability to demonstrate self-control will improve, Ability to identify and develop effective coping behaviors will improve, Ability to maintain clinical measurements within normal limits will improve, Compliance with prescribed medications will improve and Ability to identify triggers associated with substance abuse/mental health issues will improve  I certify that inpatient services furnished can reasonably be expected to improve the patient's condition.    Beverly Sessions, MD 10/27/201811:22 AM

## 2016-12-23 NOTE — [facility] Group Notes (Signed)
LCSW Group Therapy Note  12/23/2016 1:00pm  Type of Therapy/Topic:  Group Therapy:  Balance in Life  Participation Level:  Did Not Attend  Description of Group:   This group will address the concept of balance and how it feels and looks when one is unbalanced. Patients will be encouraged to process areas in their lives that are out of balance and identify reasons for remaining unbalanced. Facilitators will guide patients in utilizing problem-solving interventions to address and correct the stressor making their life unbalanced. Understanding and applying boundaries will be explored and addressed for obtaining and maintaining a balanced life. Patients will be encouraged to explore ways to assertively make their unbalanced needs known to significant others in their lives, using other group members and facilitator for support and feedback.  Therapeutic Goals: 1. Patient will identify two or more emotions or situations they have that consume much of in their lives. 2. Patient will identify signs/triggers that life has become out of balance:  3. Patient will identify two ways to set boundaries in order to achieve balance in their lives:  4. Patient will demonstrate ability to communicate their needs through discussion and/or role plays  Summary of Patient Progress:      Therapeutic Modalities:   Cognitive Behavioral Therapy Solution-Focused Therapy Assertiveness Training  Rondall Allegra, LCSW 12/23/2016 2:08 PM

## 2016-12-23 NOTE — [facility] Group Notes (Signed)
[facility] Group Notes:  (Nursing/MHT/Case Management/Adjunct)  Date:  12/23/2016  Time:  9:30 PM  Type of Therapy:  Wrap up Grp  Participation Level:  Did Not Attend  Participation Quality: Summary of Progress/Problems:  Debra [name] 12/23/2016, 9:30 PM
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
## 2016-12-23 NOTE — Progress Notes (Signed)
Admission assessment completed. Pt is depressed, rating is 10/10. Endorses anhedonia, poor sleep and appetite. Has thoughts of SI by overdosing on her meds.  but contracts for safety. Denies SI or AVH. Marland Kitchen Pt has MS and walks with a walker. She has been up to the BR using the walker and required no assistance. She has been able to get into and out of bed without assistance  Using her walker. At HS CS was 112. As per protocol MD was notified. Stat blood sugar was ordered. Result was 447. Pt was given her insulin as ordered. At 2357 cs was 410. Pt appears to be sleeping well at this time.

## 2016-12-23 NOTE — Progress Notes (Signed)
D: Patient is observed in room resting quietly.  She has not came out of the room today.  Patient continues to ambulate with walker and is unsteady on her feet.  She is a high fall risk.  Patient continues to endorse thoughts of self harm and is reluctant to contract for safety on the unit.  She states, "I won't hurt myself as long as I'm here."  Patient's breakfast was brought to her and she ate well.  Per inpatient diabetic nurse recommendations, sliding scale was changed from moderate to sensitive TID.  Meal coverage and HS coverage will remain the same.  Patient does not appear to be responding to internal stimuli. A: Continue to monitor medication management and MD orders.  Safety checks continued every 15 minutes per protocol.  Offer support and encouragement as needed. R: Patient is receptive to staff.  She has been asked to ring call bell if she needs assistance.

## 2016-12-23 NOTE — [facility] Suicide Risk Assessment (Signed)
[facility] Admission Suicide Risk Assessment   Nursing information obtained from:    Demographic factors:    Current Mental Status:    Loss Factors:    Historical Factors:    Risk Reduction Factors:     Total Time spent with patient: 1 hour Principal Problem: Major depressive disorder, single episode, severe without psychosis (HCC) Diagnosis:   Patient Active Problem List   Diagnosis Date Noted  . MDD (major depressive disorder), recurrent severe, without psychosis (HCC) [F33.2]   . Major depressive disorder, single episode, severe without psychosis (HCC) [F32.2] 12/22/2016  . Multiple joint pain [M25.50]   . HTN (hypertension) [I10]   . Acute pain of left knee [M25.562]   . Neurogenic bladder [N31.9]   . Acute lower UTI [N39.0]   . Labile blood pressure [R09.89]   . Acute blood loss anemia [D62]   . Leukocytosis [D72.829]   . Labile blood glucose [R73.09]   . Hypertensive crisis [I16.9]   . Hypoglycemia [E16.2]   . Poorly controlled type 2 diabetes mellitus with peripheral neuropathy (HCC) [E11.42, E11.65]   . Uncontrolled hypertension [I10]   . Sleep disturbance [G47.9]   . Multiple sclerosis (HCC) [G35] 11/22/2016  . Thoracic root lesion [G54.3] 11/22/2016  . MS (multiple sclerosis) (HCC) [G35]   . Neuropathic pain [M79.2]   . Uncontrolled type 2 diabetes mellitus with peripheral neuropathy (HCC) [E11.42, E11.65]   . Urinary retention [R33.9]   . Medically noncompliant [Z91.19]   . Leg weakness, bilateral [R29.898] 11/20/2016  . Diabetic ketoacidosis without coma associated with type 1 diabetes mellitus (HCC) [E10.10]   . Leg pain [M79.606]   . History of CVA (cerebrovascular accident) [Z86.73]   . 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 Benign essential HTN [I10]   . Hypertensive urgency [I16.0]   . AKI (acute kidney injury) (HCC) [N17.9]   . Fibromyalgia [M79.7] 11/07/2016  . Back pain [M54.9] 10/31/2016  .  Stable angina pectoris (HCC) [I20.8]   . Vitamin D deficiency [E55.9] 08/31/2014  . 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 Hyperlipidemia [E78.5] 10/06/2013  . Depression [F32.9] 10/16/2012  . Bilateral knee pain [M25.561, M25.562] 02/16/2012  . Chronic diarrhea [K52.9] 10/19/2010  . Peripheral neuropathy [G62.9] 08/19/2010  . Type 1 diabetes mellitus (HCC) [E10.9] 07/06/2010  . Hypertension [I10] 07/06/2010  . Asthma [J45.909] 07/06/2010  . Migraine [G43.909] 07/06/2010   Subjective Data:  " I have MS , I need walker, cant' drive" Debra Gessler Reidis a 51 y.o.femalepatient referred by Debra [name], admitted voluntarily. Pt admitted with suicidal ideation with plan to OD on rx meds  in the setting of multiple stressors including recent diagnosis of multiple sclerosis. Pt reports that she  has a long history of depression ( more than 10 yrs) , getting worse for last 1 month with suicidal ideation . She is currently not taking any medications for her mood, although hx of tx with prozac and cymbalta. She reports worsening of her mood over the past month due to recent diagnosis of multiple sclerosis, needing walker to ambulate, ex boyfriend unable to take care of her, cannot go back with her ex boyfriend upon d/c, pending homelessness. . She reports worsening SI. She feels hopeless,  lack of support. She is unable to return to her previous house where she lived with her ex boyfriend and her daughter is unable to have her at her home. She is unable to  work or drive anymore due to leg weakness. She was recently admitted to the inpatient rehab service (9/26-10/19).  She denies substance use. She denies AVH, paranoia.  Pt is depressed, decreased PMA, poor eye contact, rating is 10/10 depression. Endorses anhedonia, poor sleep and appetite. Admits  thoughts of SI by overdosing on her meds, but contracts for safety.   Continued Clinical Symptoms:  Alcohol Use  Disorder Identification Test Final Score (AUDIT): 0 The "Alcohol Use Disorders Identification Test", Guidelines for Use in Primary Care, Second Edition.  World Science writerHealth Organization [facility](WHO). Score between 0-7:  no or low risk or alcohol related problems. Score between 8-15:  moderate risk of alcohol related problems. Score between 16-19:  high risk of alcohol related problems. Score 20 or above:  warrants further diagnostic evaluation for alcohol dependence and treatment.   CLINICAL FACTORS:   Depression:   Anhedonia Hopelessness Insomnia   Musculoskeletal: Strength & Muscle Tone: decreased Gait & Station: unsteady Patient leans:   Psychiatric Specialty Exam: Physical Exam  Nursing note and vitals reviewed.   ROS  Blood pressure 134/73, pulse 69, temperature 98 F (36.7 C), temperature source Oral, resp. rate 18, height 5\' 4"  (1.626 m), weight 68 kg (150 lb), SpO2 99 %.Body mass index is 25.75 kg/m.  General Appearance: appears her stated age, poorly  groomed and wearing corrective lenses and sitting on the side of her bed   Eye Contact: poor  Speech: slow  Volume: soft  Mood: Depressed  Affect: Depressed  Thought Process: Linear, concrete  Orientation: Full (Time, Place, and Person)  Thought Content: She discusses multiple stressors including medical conditions and homelessness.  hopelessness  Suicidal Thoughts: Yes. with plan  Homicidal Thoughts: No  Memory: Immediate; Good Recent; Good Remote; Good  Judgement: limited  Insight: fair  Psychomotor Activity: slow  Concentration: Concentration: fairand Attention Span: fair  Recall: Good  Fund of Knowledge: Good  Language: Good  Akathisia: No  Handed:   AIMS (if indicated):   Assets: Desire for Improvement  ADL's: Intact  Cognition: WNL  Sleep-6.3       COGNITIVE FEATURES THAT CONTRIBUTE TO RISK:  Polarized thinking    SUICIDE RISK:   high  PLAN OF CARE:  Daily contact with  patient to assess and evaluate symptoms and progress in treatment and Medication management.  Debra BleakCheryl L Reidis a 51 y.o.femalepatient referred by Debra GuilesWesley Long Ed, admitted voluntarily. Pt admitted with suicidal ideation with plan to OD on rx meds  in the setting of multiple stressors including recent diagnosis of multiple sclerosis. Pt not on antidepressant recently.  Will start Cymbalta. Will  monitor SI.  Manage medical issues as appropriate. Diabetes consult recommending - Novolog Sensitive Correction Scale/ SSI (0-9 units) TID AC + HS. Voluntary status.    Observation Level/Precautions:  Fall 15 minute checks  Laboratory:  CBC Chemistry Profile HCG UDS UA, EKG  Psychotherapy:    Medications:    Consultations:    Discharge Concerns:    Estimated LOS:  Other:     Physician Treatment Plan for Primary Diagnosis: Major depressive disorder, single episode, severe without psychosis (HCC) Long Term Goal(s): Improvement in symptoms so as ready for discharge  Short Term Goals: Ability to identify changes in lifestyle to reduce recurrence of condition will improve, Ability to verbalize feelings will improve, Ability to disclose and discuss suicidal ideas, Ability to demonstrate self-control will improve, Ability to identify and develop effective coping behaviors will improve, Ability to maintain clinical measurements within normal limits will  improve, Compliance with prescribed medications will improve and Ability to identify triggers associated with substance abuse/mental health issues will improve  Physician Treatment Plan for Secondary Diagnosis: Principal Problem:   Major depressive disorder, single episode, severe without psychosis (HCC) Active Problems:   Type 1 diabetes mellitus (HCC)   Hypertension   Hyperlipidemia   MS (multiple sclerosis) (HCC)  Long Term Goal(s): Improvement in symptoms so as ready for discharge  Short Term Goals: Ability to identify changes in  lifestyle to reduce recurrence of condition will improve, Ability to verbalize feelings will improve, Ability to disclose and discuss suicidal ideas, Ability to demonstrate self-control will improve, Ability to identify and develop effective coping behaviors will improve, Ability to maintain clinical measurements within normal limits will improve, Compliance with prescribed medications will improve and Ability to identify triggers associated with substance abuse/mental health issues will improve   I certify that inpatient services furnished can reasonably be expected to improve the patient's condition.   Beverly Sessions, MD 12/23/2016, 11:58 AM

## 2016-12-24 LAB — GLUCOSE, CAPILLARY
GLUCOSE-CAPILLARY: 267 mg/dL — AB (ref 65–99)
Glucose-Capillary: 342 mg/dL — ABNORMAL HIGH (ref 65–99)
Glucose-Capillary: 242 mg/dL — ABNORMAL HIGH (ref 65–99)
Glucose-Capillary: 262 mg/dL — ABNORMAL HIGH (ref 65–99)

## 2016-12-24 MED ORDER — DULOXETINE HCL 30 MG PO CPEP
30.0000 mg | ORAL_CAPSULE | Freq: Every day | ORAL | Status: DC
  Administered 2016-12-25 – 2016-12-27 (×3): 30 mg via ORAL
  Filled 2016-12-24 (×3): qty 1

## 2016-12-24 NOTE — Progress Notes (Signed)
Inpatient Diabetes Program Recommendations  AACE/ADA: New Consensus Statement on Inpatient Glycemic Control (2015)  Target Ranges:  Prepandial:   less than 140 mg/dL      Peak postprandial:   less than 180 mg/dL (1-2 hours)      Critically ill patients:  140 - 180 mg/dL   Lab Results  Component Value Date   GLUCAP 342 (H) 12/24/2016   HGBA1C 11.4 (H) 12/23/2016    Review of Glycemic Control Results for Debra [name], Debra [name] (MRN [id number]) as of 12/24/2016 09:49  Ref. Range 12/23/2016 06:52 12/23/2016 11:34 12/23/2016 16:22 12/23/2016 20:38 12/24/2016 07:07  Glucose-Capillary Latest Ref Range: 65 - 99 mg/dL 470 (H) 929 (H) 574 (H) 171 (H) 342 (H)   Inpatient Diabetes Program Recommendations:   -Increase Lantus to home dose of 17 units bid.  Thank you, Debra [name]. Faten Frieson, RN, MSN, CDE  Diabetes Coordinator Inpatient Glycemic Control Team Team Pager [phone] (8am-5pm) 12/24/2016 9:49 AM

## 2016-12-24 NOTE — Progress Notes (Signed)
Has removed in her room all shift. States it is too difficult to walk to the dayroom and back. CS at 2100 was 171, no coverage required. Some spontaneous smiles, continues to endorse depression, hopelessness and helplessness. Up to the BR to void. States her urine is cloudy. States it is difficult to empty her bladder. Had been treated recently for a UTI (e coli) Urine culture and PT consult ordered by Dr Joseph Art.  Remains on routine obs. Urine culture pending.

## 2016-12-24 NOTE — [facility] Counselor (Signed)
CSW attempted SPE with pt's son Debra [name] [phone]. CSW left message requesting call back.   Daisy Floro Ciella Obi MSW, LCSW  12/24/2016 3:14 PM

## 2016-12-24 NOTE — Plan of Care (Signed)
Problem: Coping: Goal: Ability to cope will improve Outcome: Not Progressing Debra [name] has been isolative in her room and taking her meals alone. Goal: Ability to verbalize feelings will improve Outcome: Not Progressing Debra [name] has been reluctant to talk about her family.

## 2016-12-24 NOTE — Progress Notes (Signed)
Received Debra [name] this am before breakfast, her BS has been checked per order. Her BS has been 342, 242, and 262 today. She has been OOB x1 to the day room with her walker. Her gait is unsteady. She feels depressed and anxious, but denied feeling suicidal. This PM she has been isolative to her room.

## 2016-12-24 NOTE — Progress Notes (Signed)
[facility] MD Progress Note  12/24/2016 2:49 PM Debra [name]  MRN:  [id number] Subjective:   Debra [name] a 51 y.o.femalewith h/o depression admitted voluntarily,  with suicidal ideation with plan to OD on rx meds  in the setting of multiple stressors including recent diagnosis of multiple sclerosis. Pt continue to endorse depression, and suicidal ideation, no plan. Reports leg pain.  Principal Problem: Major depressive disorder, single episode, severe without psychosis (HCC) Diagnosis:   Patient Active Problem List   Diagnosis Date Noted  . MDD (major depressive disorder), recurrent severe, without psychosis (HCC) [F33.2]   . Major depressive disorder, single episode, severe without psychosis (HCC) [F32.2] 12/22/2016  . Multiple joint pain [M25.50]   . HTN (hypertension) [I10]   . Acute pain of left knee [M25.562]   . Neurogenic bladder [N31.9]   . Acute lower UTI [N39.0]   . Labile blood pressure [R09.89]   . Acute blood loss anemia [D62]   . Leukocytosis [D72.829]   . Labile blood glucose [R73.09]   . Hypertensive crisis [I16.9]   . Hypoglycemia [E16.2]   . Poorly controlled type 2 diabetes mellitus with peripheral neuropathy (HCC) [E11.42, E11.65]   . Uncontrolled hypertension [I10]   . Sleep disturbance [G47.9]   . Multiple sclerosis (HCC) [G35] 11/22/2016  . Thoracic root lesion [G54.3] 11/22/2016  . MS (multiple sclerosis) (HCC) [G35]   . Neuropathic pain [M79.2]   . Uncontrolled type 2 diabetes mellitus with peripheral neuropathy (HCC) [E11.42, E11.65]   . Urinary retention [R33.9]   . Medically noncompliant [Z91.19]   . Leg weakness, bilateral [R29.898] 11/20/2016  . Diabetic ketoacidosis without coma associated with type 1 diabetes mellitus (HCC) [E10.10]   . Leg pain [M79.606]   . History of CVA (cerebrovascular accident) [Z86.73]   . 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 Benign essential HTN [I10]    . Hypertensive urgency [I16.0]   . AKI (acute kidney injury) (HCC) [N17.9]   . Fibromyalgia [M79.7] 11/07/2016  . Back pain [M54.9] 10/31/2016  . Stable angina pectoris (HCC) [I20.8]   . Vitamin D deficiency [E55.9] 08/31/2014  . 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 Hyperlipidemia [E78.5] 10/06/2013  . Depression [F32.9] 10/16/2012  . Bilateral knee pain [M25.561, M25.562] 02/16/2012  . Chronic diarrhea [K52.9] 10/19/2010  . Peripheral neuropathy [G62.9] 08/19/2010  . Type 1 diabetes mellitus (HCC) [E10.9] 07/06/2010  . Hypertension [I10] 07/06/2010  . Asthma [J45.909] 07/06/2010  . Migraine [G43.909] 07/06/2010   Total Time spent with patient: 25 min  Past Psychiatric History: no new info  Past Medical History:  Past Medical History:  Diagnosis Date  . Adenomatous colonic polyps   . Anemia    2005  . Anxiety    1990  . Arthritis   . Asthma    2000  . Cataract   . Depression 1990  . Difficult intubation    narrow airway  . Gastroesophageal Reflux Disease (GERD)   . Heart murmur    Birth  . Hyperlipidemia    2005  . Hypertension    1998  . Internal hemorrhoids 04/24/06   on colonoscopy  . Migraine   . Neuropathy of the hands & feet   . Restless Leg Syndrome   . Right Ankle Fracture 10/06/2013  . Sleep paralysis   . Stable angina pectoris    2007: cath showing normal cors.   . Stroke 1990  . Type  I Diabetes Mellitus 1988    Past Surgical History:  Procedure Laterality Date  . bilateral foot surgery    . BREAST SURGERY Left    biopsy left breast  . CARDIAC CATHETERIZATION  2007   around 2007 or 2008  . CESAREAN SECTION    . CHOLECYSTECTOMY    . COLONOSCOPY W/ BIOPSIES AND POLYPECTOMY    . DILATION AND CURETTAGE OF UTERUS  12/29/2010   Surgeon: Tereso Newcomer, MD;  Location: [facility];  Service: Gynecology  . ENDOMETRIAL ABLATION W/ NOVASURE N/A 12/2009  . EYE SURGERY    . HYSTEROSCOPY  12/29/2010   Procedure:  HYSTEROSCOPY WITH HYDROTHERMAL ABLATION;  Surgeon: Tereso Newcomer, MD;  Location: [facility];  Service: Gynecology;;  . IUD REMOVAL  12/29/2010   Procedure: INTRAUTERINE DEVICE (IUD) REMOVAL;  Surgeon: Tereso Newcomer, MD;  Location: [facility];  Service: Gynecology;  Laterality: N/A;  . LAPAROSCOPIC TUBAL LIGATION  12/29/2010   Procedure: LAPAROSCOPIC TUBAL LIGATION;  Surgeon: Tereso Newcomer, MD;  Location: [facility];  Service: Gynecology;  Laterality: Bilateral;  . ORIF ANKLE FRACTURE Right 10/09/2013   Procedure: Open reduction internal fixation right ankle;  Surgeon: Mable Paris, MD;  Location: [facility];  Service: Orthopedics;  Laterality: Right;  Open reduction internal fixation right ankle  . TRIGGER FINGER RELEASE     x 3   Family History:  Family History  Problem Relation Age of Onset  . Hypertension Mother   . Kidney disease Mother   . Hypertension Father   . Breast cancer Maternal Grandmother   . Prostate cancer Maternal Grandfather   . Ovarian cancer Paternal Grandmother   . Prostate cancer Paternal Grandfather   . Colon cancer Maternal Uncle        Family history of malignant neoplasm of gastrointestinal tract   Family Psychiatric  History: no new info Social History:  History  Alcohol Use No     History  Drug Use No    Comment: drug addict    Social History   Social History  . Marital status: Divorced    Spouse name: N/A  . Number of children: 2  . Years of education: 14   Occupational History  . Unemployed    Social History Main Topics  . Smoking status: Never Smoker  . Smokeless tobacco: Never Used  . Alcohol use No  . Drug use: No     Comment: drug addict  . Sexual activity: Yes    Birth control/ protection: Other-see comments     Comment: pt states she had ablation in 2011   Other Topics Concern  . None   Social History Narrative   Lost medicaid about 2009 ish when her youngest child turned 62. Has adult children. Lives with boyfriend who  financially supports her. Has attempted to get disability but has been turned down.      2-3 caffeine drinks daily    Additional Social History:                         Sleep: Good  Appetite:  Fair  Current Medications: Current Facility-Administered Medications  Medication Dose Route Frequency Provider Last Rate Last Dose  . acetaminophen (TYLENOL) tablet 650 mg  650 mg Oral Q6H PRN Pucilowska, Jolanta B, MD   650 mg at 12/23/16 1533  . albuterol (PROVENTIL HFA;VENTOLIN HFA) 108 (90 Base) MCG/ACT inhaler 2 puff  2 puff Inhalation Q6H PRN Pucilowska, Jolanta B, MD      .  alum & mag hydroxide-simeth (MAALOX/MYLANTA) 200-200-20 MG/5ML suspension 30 mL  30 mL Oral Q4H PRN Pucilowska, Jolanta B, MD      . amLODipine (NORVASC) tablet 10 mg  10 mg Oral Daily Pucilowska, Jolanta B, MD   10 mg at 12/24/16 0758  . aspirin EC tablet 81 mg  81 mg Oral Daily Pucilowska, Jolanta B, MD   81 mg at 12/24/16 0758  . atorvastatin (LIPITOR) tablet 40 mg  40 mg Oral q1800 Pucilowska, Jolanta B, MD   40 mg at 12/23/16 1615  . DULoxetine (CYMBALTA) DR capsule 20 mg  20 mg Oral Daily Beverly Sessions, MD   20 mg at 12/24/16 0758  . etodolac (LODINE) capsule 200 mg  200 mg Oral BID Pucilowska, Jolanta B, MD   200 mg at 12/24/16 0758  . gabapentin (NEURONTIN) tablet 600 mg  600 mg Oral TID Pucilowska, Jolanta B, MD   600 mg at 12/24/16 1244  . hydrochlorothiazide (MICROZIDE) capsule 12.5 mg  12.5 mg Oral Daily Pucilowska, Jolanta B, MD   12.5 mg at 12/24/16 0758  . insulin aspart (novoLOG) injection 0-5 Units  0-5 Units Subcutaneous QHS Beverly Sessions, MD      . insulin aspart (novoLOG) injection 0-9 Units  0-9 Units Subcutaneous TID WC Beverly Sessions, MD   3 Units at 12/24/16 1200  . insulin aspart (novoLOG) injection 6 Units  6 Units Subcutaneous TID WC Pucilowska, Jolanta B, MD   6 Units at 12/24/16 1159  . insulin glargine (LANTUS) injection 15 Units  15 Units Subcutaneous BID Pucilowska,  Jolanta B, MD   15 Units at 12/24/16 0755  . lisinopril (PRINIVIL,ZESTRIL) tablet 20 mg  20 mg Oral Daily Pucilowska, Jolanta B, MD   20 mg at 12/24/16 0758  . magnesium hydroxide (MILK OF MAGNESIA) suspension 30 mL  30 mL Oral Daily PRN Pucilowska, Jolanta B, MD      . metoprolol succinate (TOPROL-XL) 24 hr tablet 100 mg  100 mg Oral Daily Pucilowska, Jolanta B, MD   100 mg at 12/24/16 0758  . nitroGLYCERIN (NITROSTAT) SL tablet 0.4 mg  0.4 mg Sublingual Q5 min PRN Pucilowska, Jolanta B, MD      . pantoprazole (PROTONIX) EC tablet 40 mg  40 mg Oral Daily Pucilowska, Jolanta B, MD   40 mg at 12/24/16 0758  . traZODone (DESYREL) tablet 100 mg  100 mg Oral QHS PRN Pucilowska, Jolanta B, MD   100 mg at 12/23/16 2116    Lab Results:  Results for orders placed or performed during the hospital encounter of 12/22/16 (from the past 48 hour(s))  Glucose, capillary     Status: Abnormal   Collection Time: 12/22/16  6:20 PM  Result Value Ref Range   Glucose-Capillary 301 (H) 65 - 99 mg/dL  Glucose, capillary     Status: Abnormal   Collection Time: 12/22/16  8:43 PM  Result Value Ref Range   Glucose-Capillary 412 (H) 65 - 99 mg/dL  Glucose, random     Status: Abnormal   Collection Time: 12/22/16  9:30 PM  Result Value Ref Range   Glucose, Bld 447 (H) 65 - 99 mg/dL  Glucose, capillary     Status: Abnormal   Collection Time: 12/22/16 11:57 PM  Result Value Ref Range   Glucose-Capillary 410 (H) 65 - 99 mg/dL   Comment 1 Notify RN   Glucose, capillary     Status: Abnormal   Collection Time: 12/23/16  6:52 AM  Result Value Ref Range  Glucose-Capillary 262 (H) 65 - 99 mg/dL  Hemoglobin Z6X     Status: Abnormal   Collection Time: 12/23/16  7:12 AM  Result Value Ref Range   Hgb A1c MFr Bld 11.4 (H) 4.8 - 5.6 %    Comment: (NOTE) Pre diabetes:          5.7%-6.4% Diabetes:              >6.4% Glycemic control for   <7.0% adults with diabetes    Mean Plasma Glucose 280.48 mg/dL    Comment:  Performed at [facility], [address] [place], [place] [address]  Lipid panel     Status: None   Collection Time: 12/23/16  7:12 AM  Result Value Ref Range   Cholesterol 105 0 - 200 mg/dL   Triglycerides 540 <981 mg/dL   HDL 46 >19 mg/dL   Total CHOL/HDL Ratio 2.3 RATIO   VLDL 21 0 - 40 mg/dL   LDL Cholesterol 38 0 - 99 mg/dL    Comment:        Total Cholesterol/HDL:CHD Risk Coronary Heart Disease Risk Table                     Men   Women  1/2 Average Risk   3.4   3.3  Average Risk       5.0   4.4  2 X Average Risk   9.6   7.1  3 X Average Risk  23.4   11.0        Use the calculated Patient Ratio above and the CHD Risk Table to determine the patient's CHD Risk.        ATP III CLASSIFICATION (LDL):  <100     mg/dL   Optimal  147-829  mg/dL   Near or Above                    Optimal  130-159  mg/dL   Borderline  562-130  mg/dL   High  >865     mg/dL   Very High   TSH     Status: None   Collection Time: 12/23/16  7:12 AM  Result Value Ref Range   TSH 1.291 0.350 - 4.500 uIU/mL    Comment: Performed by a 3rd Generation assay with a functional sensitivity of <=0.01 uIU/mL.  Glucose, capillary     Status: Abnormal   Collection Time: 12/23/16 11:34 AM  Result Value Ref Range   Glucose-Capillary 126 (H) 65 - 99 mg/dL  Glucose, capillary     Status: Abnormal   Collection Time: 12/23/16  4:22 PM  Result Value Ref Range   Glucose-Capillary 220 (H) 65 - 99 mg/dL  Glucose, capillary     Status: Abnormal   Collection Time: 12/23/16  8:38 PM  Result Value Ref Range   Glucose-Capillary 171 (H) 65 - 99 mg/dL   Comment 1 Notify RN   Glucose, capillary     Status: Abnormal   Collection Time: 12/24/16  7:07 AM  Result Value Ref Range   Glucose-Capillary 342 (H) 65 - 99 mg/dL   Comment 1 Notify RN   Glucose, capillary     Status: Abnormal   Collection Time: 12/24/16 11:56 AM  Result Value Ref Range   Glucose-Capillary 242 (H) 65 - 99 mg/dL    Blood Alcohol level:   Lab Results  Component Value Date   ETH <10 12/20/2016   ETH  12/09/2006    <  5        LOWEST DETECTABLE LIMIT FOR SERUM ALCOHOL IS 11 mg/dL FOR MEDICAL PURPOSES ONLY    Metabolic Disorder Labs: Lab Results  Component Value Date   HGBA1C 11.4 (H) 12/23/2016   MPG 280.48 12/23/2016   No results found for: PROLACTIN Lab Results  Component Value Date   CHOL 105 12/23/2016   TRIG 103 12/23/2016   HDL 46 12/23/2016   CHOLHDL 2.3 12/23/2016   VLDL 21 12/23/2016   LDLCALC 38 12/23/2016   LDLCALC 112 (H) 04/21/2015    Physical Findings: AIMS: Facial and Oral Movements Muscles of Facial Expression: None, normal Lips and Perioral Area: None, normal Jaw: None, normal Tongue: None, normal,Extremity Movements Upper (arms, wrists, hands, fingers): None, normal Lower (legs, knees, ankles, toes): None, normal,  , Overall Severity Severity of abnormal movements (highest score from questions above): None, normal Incapacitation due to abnormal movements: None, normal Patient's awareness of abnormal movements (rate only patient's report): No Awareness, Dental Status Current problems with teeth and/or dentures?: No Does patient usually wear dentures?: No  CIWA:    COWS:     Musculoskeletal: Strength & Muscle Tone: decreased Gait & Station: unsteady Patient leans:   Psychiatric Specialty Exam: Physical Exam  Nursing note and vitals reviewed.   ROS  Blood pressure (!) 187/82, pulse 77, temperature 98.5 F (36.9 C), temperature source Oral, resp. rate 18, height 5\' 4"  (1.626 m), weight 68 kg (150 lb), SpO2 100 %.Body mass index is 25.75 kg/m.  General Appearance: appears her stated age, poorly  groomed and wearing corrective lenses and sitting on the side of her bed   Eye Contact: poor  Speech: slow  Volume: soft  Mood: Depressed  Affect: Depressed  Thought Process: Linear, concrete  Orientation: Full (Time, Place, and Person)  Thought Content: She discusses  multiple stressors including medical conditions and homelessness.  hopelessness  Suicidal Thoughts: Yes. with plan  Homicidal Thoughts: No  Memory: Immediate; Good Recent; Good Remote; Good  Judgement: limited  Insight: fair  Psychomotor Activity: slow  Concentration: Concentration: fairand Attention Span: fair  Recall: Good  Fund of Knowledge: Good  Language: Good  Akathisia: No  Handed:   AIMS (if indicated):   Assets: Desire for Improvement  ADL's: Intact  Cognition: WNL  Sleep-6.45       Treatment Plan Summary: Daily contact with patient to assess and evaluate symptoms and progress in treatment and Medication management.  Debra [name] a 51 y.o.femalepatient referred by [facility], admitted voluntarily. Pt admitted with suicidal ideation with plan to OD on rx meds  in the setting of multiple stressors including recent diagnosis of multiple sclerosis. Pt not on antidepressant recently.  Will increase Cymbalta to 30mg  daily. Cont Neurontin, etodolac.   Manage medical issues as appropriate. Diabetes consult recommending - Novolog Sensitive Correction Scale/ SSI (0-9 units) TID AC + HS. Voluntary status.    Observation Level/Precautions:  Fall 15 minute checks  Laboratory:  CBC Chemistry Profile HCG UDS UA, EKG  Psychotherapy:    Medications:    Consultations:    Discharge Concerns:    Estimated LOS:  Other:     Physician Treatment Plan for Primary Diagnosis: Major depressive disorder, single episode, severe without psychosis (HCC) Long Term Goal(s): Improvement in symptoms so as ready for discharge  Short Term Goals: Ability to identify changes in lifestyle to reduce recurrence of condition will improve, Ability to verbalize feelings will improve, Ability to disclose and discuss suicidal ideas, Ability  to demonstrate self-control will improve, Ability to identify and develop effective coping behaviors will improve, Ability to  maintain clinical measurements within normal limits will improve, Compliance with prescribed medications will improve and Ability to identify triggers associated with substance abuse/mental health issues will improve  Physician Treatment Plan for Secondary Diagnosis: Principal Problem:   Major depressive disorder, single episode, severe without psychosis (HCC) Active Problems:   Type 1 diabetes mellitus (HCC)   Hypertension   Hyperlipidemia   MS (multiple sclerosis) (HCC)  Long Term Goal(s): Improvement in symptoms so as ready for discharge  Short Term Goals: Ability to identify changes in lifestyle to reduce recurrence of condition will improve, Ability to verbalize feelings will improve, Ability to disclose and discuss suicidal ideas, Ability to demonstrate self-control will improve, Ability to identify and develop effective coping behaviors will improve, Ability to maintain clinical measurements within normal limits will improve, Compliance with prescribed medications will improve and Ability to identify triggers associated with substance abuse/mental health issues will improve  I certify that inpatient services furnished can reasonably be expected to improve the patient's condition.      Beverly Sessions, MD 12/24/2016, 2:49 PM

## 2016-12-24 NOTE — [facility] Group Notes (Signed)
LCSW Group Therapy Note  12/24/2016 1:15pm  Type of Therapy/Topic:  Group Therapy:  Emotion Regulation  Participation Level:  Did Not Attend   Description of Group:   The purpose of this group is to assist patients in learning to regulate negative emotions and experience positive emotions. Patients will be guided to discuss ways in which they have been vulnerable to their negative emotions. These vulnerabilities will be juxtaposed with experiences of positive emotions or situations, and patients will be challenged to use positive emotions to combat negative ones. Special emphasis will be placed on coping with negative emotions in conflict situations, and patients will process healthy conflict resolution skills.  Therapeutic Goals: 1. Patient will identify two positive emotions or experiences to reflect on in order to balance out negative emotions 2. Patient will label two or more emotions that they find the most difficult to experience 3. Patient will demonstrate positive conflict resolution skills through discussion and/or role plays  Summary of Patient Progress:       Therapeutic Modalities:   Cognitive Behavioral Therapy Feelings Identification Dialectical Behavioral Therapy   Rondall Allegra, LCSW 12/24/2016 2:37 PM

## 2016-12-25 ENCOUNTER — Encounter: Payer: Self-pay | Admitting: Internal Medicine

## 2016-12-25 DIAGNOSIS — F322 Major depressive disorder, single episode, severe without psychotic features: Secondary | ICD-10-CM

## 2016-12-25 LAB — GLUCOSE, CAPILLARY
Glucose-Capillary: 356 mg/dL — ABNORMAL HIGH (ref 65–99)
Glucose-Capillary: 138 mg/dL — ABNORMAL HIGH (ref 65–99)
Glucose-Capillary: 193 mg/dL — ABNORMAL HIGH (ref 65–99)
Glucose-Capillary: 204 mg/dL — ABNORMAL HIGH (ref 65–99)

## 2016-12-25 MED ORDER — INSULIN GLARGINE 100 UNIT/ML ~~LOC~~ SOLN
18.0000 [IU] | Freq: Two times a day (BID) | SUBCUTANEOUS | Status: DC
  Administered 2016-12-25 – 2016-12-26 (×2): 18 [IU] via SUBCUTANEOUS
  Filled 2016-12-25 (×3): qty 0.18

## 2016-12-25 MED ORDER — INSULIN ASPART 100 UNIT/ML ~~LOC~~ SOLN
8.0000 [IU] | Freq: Three times a day (TID) | SUBCUTANEOUS | Status: DC
  Administered 2016-12-25 – 2016-12-28 (×9): 8 [IU] via SUBCUTANEOUS
  Filled 2016-12-25 (×4): qty 1

## 2016-12-25 MED ORDER — BISMUTH SUBSALICYLATE 262 MG/15ML PO SUSP
30.0000 mL | ORAL | Status: DC | PRN
  Filled 2016-12-25: qty 118

## 2016-12-25 MED ORDER — LISINOPRIL 20 MG PO TABS
20.0000 mg | ORAL_TABLET | Freq: Two times a day (BID) | ORAL | Status: DC
  Administered 2016-12-25 – 2017-01-01 (×14): 20 mg via ORAL
  Filled 2016-12-25 (×14): qty 1

## 2016-12-25 MED ORDER — HYDROCHLOROTHIAZIDE 25 MG PO TABS
25.0000 mg | ORAL_TABLET | Freq: Every day | ORAL | Status: DC
  Administered 2016-12-26 – 2017-01-01 (×7): 25 mg via ORAL
  Filled 2016-12-25 (×7): qty 1

## 2016-12-25 MED ORDER — BISMUTH SUBSALICYLATE 262 MG PO CHEW
524.0000 mg | CHEWABLE_TABLET | ORAL | Status: DC | PRN
  Filled 2016-12-25: qty 2
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
## 2016-12-25 NOTE — [facility] Group Notes (Signed)
[facility] LCSW Group Therapy Note  Date/Time:12/25/16, 0930  Type of Therapy and Topic:  Group Therapy:  Overcoming Obstacles  Participation Level:  Did not attend  Description of Group:    In this group patients will be encouraged to explore what they see as obstacles to their own wellness and recovery. They will be guided to discuss their thoughts, feelings, and behaviors related to these obstacles. The group will process together ways to cope with barriers, with attention given to specific choices patients can make. Each patient will be challenged to identify changes they are motivated to make in order to overcome their obstacles. This group will be process-oriented, with patients participating in exploration of their own experiences as well as giving and receiving support and challenge from other group members.  Therapeutic Goals: 1. Patient will identify personal and current obstacles as they relate to admission. 2. Patient will identify barriers that currently interfere with their wellness or overcoming obstacles.  3. Patient will identify feelings, thought process and behaviors related to these barriers. 4. Patient will identify two changes they are willing to make to overcome these obstacles:    Summary of Patient Progress      Therapeutic Modalities:   Cognitive Behavioral Therapy Solution Focused Therapy Motivational Interviewing Relapse Prevention Therapy  Daleen Squibb, LCSW

## 2016-12-25 NOTE — Progress Notes (Signed)
Recreation Therapy Notes  INPATIENT RECREATION THERAPY ASSESSMENT  Patient Details Name: Debra [name] MRN: [id number] DOB: 1965-12-14 Today's Date: 12/25/2016  Patient Stressors: Other (Comment) (Medical) Patient suffers from MS and is having trouble dealing with it.   Coping Skills:   Isolate, Avoidance  Personal Challenges: Stress Management, Trusting Others  Leisure Interests (2+):  Individual - Reading (Cooking)  Awareness of Community Resources:  Yes  Community Resources:   N/A  Current Use: No  If no, Barriers?: Other (Comment) (N/A)  Patient Strengths:  Problem Solving, Easy to talk to  Patient Identified Areas of Improvement:  Work on self  Current Recreation Participation:  None  Patient Goal for Hospitalization:  Not feel so hopeless ad helpless  [place] of Residence:  [place] [employer] of Residence:  [place]   Current [place] (including self-harm):  Yes  Current [place]:  No  Consent to Intern Participation: N/A   Debra [name] 12/25/2016, 2:38 PM
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
## 2016-12-25 NOTE — [facility] Suicide Risk Assessment (Signed)
[facility] INPATIENT:  Family/Significant Other Suicide Prevention Education  Suicide Prevention Education:  Contact Attempts:Dustin Cathcart [phone]  has been identified by the patient as the family member/significant other with whom the patient will be residing, and identified as the person(s) who will aid the patient in the event of a mental health crisis.  With written consent from the patient, two attempts were made to provide suicide prevention education, prior to and/or following the patient's discharge.  We were unsuccessful in providing suicide prevention education.  A suicide education pamphlet was given to the patient to share with family/significant other.  Date and time of first attempt:12/24/16, 1514: Attempt was made by [facility]. Date and time of second attempt:  Lorri Frederick, LCSW 12/25/2016, 11:21 AM

## 2016-12-25 NOTE — Tx Team (Signed)
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  The patient and family have been given the opportunity to ask questions and make suggestions.  Berkley Harvey, RN 12/25/2016, 8:48 AM

## 2016-12-25 NOTE — Progress Notes (Signed)
Data: Patient is alert and oriented, reports no AVH, and has passive SI at this time, will verbally contract for staftey. Patient is depressed, hopeless, and reports anxiety with a sad affect. Patient has complaint of pain in legs and hips and a pain rating of 3/10. Patient reports fair sleep for 5hours, appetite is fair. Patient rates depression "8/10" , Feelings of hopelessness "10/10" and Anxiety "8/10" Patient is unable to determine a daily goal.  Patient continues to be incontinent of stool and urine.  Patient has had consults today with internal medicine, physical therapy, and adult protective services.   Action:  Q x 15 minute observation checks were completed for safety, patient is 1:1 for safety. Patient was provided with education on medications, and discussed nutrition for DM. Patient was offered support and encouragement. Patient was given scheduled medications. Patient  was encourage to attend groups, participate in unit activities and continue with plan of care.    Response: Patient is medication compliant. Patient is receptive to treatment and safety maintained on unit.

## 2016-12-25 NOTE — Progress Notes (Signed)
0445 Pt found laying on bed sideways. Stated she tried to get up but rt leg went out. Assisted to sitting position, and standing with walker Pt is incontinent of lge amount of loose light brown, malodorous stool. Showered with 2 assist.

## 2016-12-25 NOTE — Tx Team (Addendum)
Interdisciplinary Treatment and Diagnostic Plan Update  12/25/2016 Time of Session: [address] [place] MRN: [id number]  Principal Diagnosis: Major depressive disorder, single episode, severe without psychosis ([place])  Secondary Diagnoses: Principal Problem:   Major depressive disorder, single episode, severe without psychosis ([place]) Active Problems:   Type 1 diabetes mellitus ([place])   Hypertension   Hyperlipidemia   MS (multiple sclerosis) ([place])   Current Medications:  Current Facility-Administered Medications  Medication Dose Route Frequency Provider Last Rate Last Dose  . acetaminophen (TYLENOL) tablet 650 mg  650 mg Oral Q6H PRN Pucilowska, Jolanta B, MD   650 mg at 12/24/16 2032  . albuterol (PROVENTIL HFA;VENTOLIN HFA) 108 (90 Base) MCG/ACT inhaler 2 puff  2 puff Inhalation Q6H PRN Pucilowska, Jolanta B, MD      . alum & mag hydroxide-simeth (MAALOX/MYLANTA) 200-200-20 MG/5ML suspension 30 mL  30 mL Oral Q4H PRN Pucilowska, Jolanta B, MD      . amLODipine (NORVASC) tablet 10 mg  10 mg Oral Daily Pucilowska, Jolanta B, MD   10 mg at 12/25/16 0754  . aspirin EC tablet 81 mg  81 mg Oral Daily Pucilowska, Jolanta B, MD   81 mg at 12/25/16 0754  . atorvastatin (LIPITOR) tablet 40 mg  40 mg Oral q1800 Pucilowska, Jolanta B, MD   40 mg at 12/24/16 1634  . bismuth subsalicylate (PEPTO BISMOL) chewable tablet 524 mg  524 mg Oral Q1H PRN McNew, Holly R, MD      . DULoxetine (CYMBALTA) DR capsule 30 mg  30 mg Oral Daily Lenward Chancellor, MD   30 mg at 12/25/16 0754  . etodolac (LODINE) capsule 200 mg  200 mg Oral BID Pucilowska, Jolanta B, MD   200 mg at 12/25/16 0752  . gabapentin (NEURONTIN) tablet 600 mg  600 mg Oral TID Pucilowska, Jolanta B, MD   600 mg at 12/25/16 1109  . [START ON 12/26/2016] hydrochlorothiazide (HYDRODIURIL) tablet 25 mg  25 mg Oral Daily Wieting, Richard, MD      . insulin aspart (novoLOG) injection 0-5 Units  0-5 Units Subcutaneous QHS Lenward Chancellor, MD   3  Units at 12/24/16 2133  . insulin aspart (novoLOG) injection 0-9 Units  0-9 Units Subcutaneous TID WC Lenward Chancellor, MD   1 Units at 12/25/16 1110  . insulin aspart (novoLOG) injection 8 Units  8 Units Subcutaneous TID WC McNew, Tyson Babinski, MD   8 Units at 12/25/16 1108  . insulin glargine (LANTUS) injection 18 Units  18 Units Subcutaneous BID McNew, Tyson Babinski, MD      . lisinopril (PRINIVIL,ZESTRIL) tablet 20 mg  20 mg Oral BID Wieting, Richard, MD      . magnesium hydroxide (MILK OF MAGNESIA) suspension 30 mL  30 mL Oral Daily PRN Pucilowska, Jolanta B, MD      . metoprolol succinate (TOPROL-XL) 24 hr tablet 100 mg  100 mg Oral Daily Pucilowska, Jolanta B, MD   100 mg at 12/25/16 0751  . nitroGLYCERIN (NITROSTAT) SL tablet 0.4 mg  0.4 mg Sublingual Q5 min PRN Pucilowska, Jolanta B, MD      . pantoprazole (PROTONIX) EC tablet 40 mg  40 mg Oral Daily Pucilowska, Jolanta B, MD   40 mg at 12/25/16 0753  . traZODone (DESYREL) tablet 100 mg  100 mg Oral QHS PRN Pucilowska, Jolanta B, MD   100 mg at 12/24/16 2133   PTA Medications: Prescriptions Prior to Admission  Medication Sig Dispense Refill Last Dose  . amLODipine (NORVASC) 10 MG  tablet Take 1 tablet (10 mg total) by mouth daily. 90 tablet 1 12/25/2016  . aspirin EC 81 MG tablet Take 1 tablet (81 mg total) by mouth daily. 90 tablet 3 12/25/2016  . atorvastatin (LIPITOR) 40 MG tablet Take 1 tablet (40 mg total) by mouth daily. 90 tablet 3 12/25/2016  . bethanechol (URECHOLINE) 25 MG tablet Take 50 mg by mouth 3 (three) times daily.  0 12/25/2016  . bethanechol (URECHOLINE) 50 MG tablet Take 1 tablet (50 mg total) by mouth 3 (three) times daily. 30 tablet 0 12/25/2016  . 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 The patient tests 4 times per day. 1 Device 0 12/25/2016  . gabapentin (NEURONTIN) 300 MG capsule Take 2 capsules (600 mg total) by mouth 3 (three) times daily. 180  capsule 0 12/25/2016  . glucose blood (AGAMATRIX AMP TEST) test strip Use as instructed 100 each 12 12/25/2016  . hydrALAZINE (APRESOLINE) 25 MG tablet Take 3 tablets (75 mg total) by mouth every 8 (eight) hours. 90 tablet 0 12/25/2016  . insulin aspart (NOVOLOG FLEXPEN) 100 UNIT/ML FlexPen Inject 5 Units into the skin 3 (three) times daily with meals. (Patient taking differently: Inject 0-10 Units into the skin 3 (three) times daily with meals. ) 15 mL 11 12/25/2016  . insulin glargine (LANTUS) 100 unit/mL SOPN Inject 0.17 mLs (17 Units total) into the skin 2 (two) times daily. 15 mL 11 12/25/2016  . insulin glargine (LANTUS) 100 unit/mL SOPN Inject 0.17 mLs (17 Units total) into the skin 2 (two) times daily. 15 mL 11 12/25/2016  . lisinopril (PRINIVIL,ZESTRIL) 20 MG tablet Take 1 tablet (20 mg total) by mouth 2 (two) times daily. 60 tablet 0 12/25/2016  . Meloxicam 10 MG CAPS Take 15 mg by mouth every morning. 30 capsule 0 12/25/2016  . metoprolol succinate (TOPROL-XL) 25 MG 24 hr tablet Take 3 tablets (75 mg total) by mouth daily. 30 tablet 0 12/25/2016  . NITROSTAT 0.4 MG SL tablet PLACE 1 TABLET UNDER TONGUE AS NEEDED FOR CHEST PAIN EVERY 5 MINUTES X 3 MAX DOSES.  CALL 911 IF PAIN PERSISTS  0 12/25/2016  . oxyCODONE 10 MG TABS Take 1 tablet (10 mg total) by mouth every 4 (four) hours as needed for moderate pain. 20 tablet 0 12/25/2016  . pantoprazole (PROTONIX) 40 MG tablet Take 1 tablet (40 mg total) by mouth daily. 30 tablet 0 12/25/2016  . PROVENTIL HFA 108 (90 Base) MCG/ACT inhaler INHALE 1 TO 2 PUFFS BY MOUTH EVERY 6 HOURS AS NEEDED FOR COUGHING, WHEEZING, OR SHORTNESS OF BREATH  0 12/25/2016  . tamsulosin (FLOMAX) 0.4 MG CAPS capsule Take 2 capsules (0.8 mg total) by mouth daily after breakfast. 30 capsule 0 12/25/2016  . traZODone (DESYREL) 50 MG tablet Take 1 tablet (50 mg total) by mouth at bedtime. 30 tablet 0 12/25/2016    Patient Stressors: Financial difficulties Health  problems Legal issue Loss of Home  Patient Strengths: Ability for insight Average or above average intelligence Communication skills  Treatment Modalities: Medication Management, Group therapy, Case management,  1 to 1 session with clinician, Psychoeducation, Recreational therapy.   Physician Treatment Plan for Primary Diagnosis: Major depressive disorder, single episode, severe without psychosis ([place]) Long Term Goal(s): Improvement in symptoms so as ready for discharge Improvement in symptoms so as ready for discharge   Short Term Goals: Ability to identify changes in lifestyle to reduce recurrence of condition will improve  Ability to verbalize feelings will improve Ability to disclose and discuss suicidal ideas Ability to demonstrate self-control will improve Ability to identify and develop effective coping behaviors will improve Ability to maintain clinical measurements within normal limits will improve Compliance with prescribed medications will improve Ability to identify triggers associated with substance abuse/mental health issues will improve Ability to identify changes in lifestyle to reduce recurrence of condition will improve Ability to verbalize feelings will improve Ability to disclose and discuss suicidal ideas Ability to demonstrate self-control will improve Ability to identify and develop effective coping behaviors will improve Ability to maintain clinical measurements within normal limits will improve Compliance with prescribed medications will improve Ability to identify triggers associated with substance abuse/mental health issues will improve  Medication Management: Evaluate patient's response, side effects, and tolerance of medication regimen.  Therapeutic Interventions: 1 to 1 sessions, Unit Group sessions and Medication administration.  Evaluation of Outcomes: Not Met  Physician Treatment Plan for Secondary Diagnosis: Principal Problem:   Major  depressive disorder, single episode, severe without psychosis ([facility]) Active Problems:   Type 1 diabetes mellitus ([facility])   Hypertension   Hyperlipidemia   MS (multiple sclerosis) ([facility])  Long Term Goal(s): Improvement in symptoms so as ready for discharge Improvement in symptoms so as ready for discharge   Short Term Goals: Ability to identify changes in lifestyle to reduce recurrence of condition will improve Ability to verbalize feelings will improve Ability to disclose and discuss suicidal ideas Ability to demonstrate self-control will improve Ability to identify and develop effective coping behaviors will improve Ability to maintain clinical measurements within normal limits will improve Compliance with prescribed medications will improve Ability to identify triggers associated with substance abuse/mental health issues will improve Ability to identify changes in lifestyle to reduce recurrence of condition will improve Ability to verbalize feelings will improve Ability to disclose and discuss suicidal ideas Ability to demonstrate self-control will improve Ability to identify and develop effective coping behaviors will improve Ability to maintain clinical measurements within normal limits will improve Compliance with prescribed medications will improve Ability to identify triggers associated with substance abuse/mental health issues will improve     Medication Management: Evaluate patient's response, side effects, and tolerance of medication regimen.  Therapeutic Interventions: 1 to 1 sessions, Unit Group sessions and Medication administration.  Evaluation of Outcomes: Not Met   RN Treatment Plan for Primary Diagnosis: Major depressive disorder, single episode, severe without psychosis ([facility]) Long Term Goal(s): Knowledge of disease and therapeutic regimen to maintain health will improve  Short Term Goals: Ability to remain free from injury will improve, Ability to verbalize feelings  will improve and Compliance with prescribed medications will improve  Medication Management: RN will administer medications as ordered by provider, will assess and evaluate patient's response and provide education to patient for prescribed medication. RN will report any adverse and/or side effects to prescribing provider.  Therapeutic Interventions: 1 on 1 counseling sessions, Psychoeducation, Medication administration, Evaluate responses to treatment, Monitor vital signs and CBGs as ordered, Perform/monitor CIWA, COWS, AIMS and Fall Risk screenings as ordered, Perform wound care treatments as ordered.  Evaluation of Outcomes: Not Met   LCSW Treatment Plan for Primary Diagnosis: Major depressive disorder, single episode, severe without psychosis ([facility]) Long Term Goal(s): Safe transition to appropriate next level of care at discharge, Engage patient in therapeutic group addressing interpersonal concerns.  Short Term Goals: Engage patient in aftercare planning with referrals and resources, Increase social support and Increase skills for wellness and recovery    Therapeutic Interventions: Assess for all discharge needs, 1 to 1 time with Education officer, museum, Explore available resources and support systems, Assess for adequacy in community support network, Educate family and significant other(s) on suicide prevention, Complete Psychosocial Assessment, Interpersonal group therapy.  Evaluation of Outcomes: Not Met   Progress in Treatment: Attending groups: No. Participating in groups: No. Taking medication as prescribed: Yes. Toleration medication: Yes. Family/Significant other contact made: No, will contact:  son Patient understands diagnosis: Yes. Discussing patient identified problems/goals with staff: Yes. Medical problems stabilized or resolved: Yes. Denies suicidal/homicidal ideation: Yes. Issues/concerns per patient self-inventory: No. Other: none  New problem(s) identified: No, Describe:   none  New Short Term/Long Term Goal(s):Pt goal: to not feel so hopeless about the future.  Discharge Plan or Barriers: CSW assessing for appropriate plan.  Reason for Continuation of Hospitalization: Depression Medication stabilization  Estimated Length of Stay: 5-7 days.  Recreational Therapy: Patient Stressors: Patient suffers from [place] and is having trouble dealing with it.  Patient Goal: Patient will participate actively in groups and activities x5 days.  Attendees: Patient:Debra [name] 12/25/2016   Physician: Dr Wonda Olds 12/25/2016   Nursing: Polly Cobia, RN 12/25/2016   RN Care Manager: 12/25/2016   Social Worker: Lurline Idol, LCSW 12/25/2016   Recreational Therapist: Roanna Epley, LRT/ CTRS 12/25/2016   Other:  12/25/2016   Other:  12/25/2016   Other: 12/25/2016     Scribe for Treatment Team: Joanne Chars, LCSW 12/25/2016 11:35 AM

## 2016-12-25 NOTE — Progress Notes (Signed)
Pleasant on approach, is laying in bed. Endorses anxiety and depression 10/10. Transported to the day room via wheelchair. Was asocial  But enjoyed being out of her room and watching TV with her peers. CS was 267 at 2100. 3 units of novolog was given. Was med compliant. Assisted to her room and to bed. Was up in the BR, stated she had a bm and voided. Assisted with putting depends on. Assisted to bed and appears to be asleep at 0040. No other physical clo, remains on routine obs for safety.

## 2016-12-25 NOTE — Progress Notes (Signed)
Recreation Therapy Notes   Date: 10.29.18  Time: 3:00pm   Location: Craft Room  Behavioral response: N/A  Group Type: Art/Craft  Participation level: N/A  Communication: N/A  Comments: Patient did not attend group.  Shalie Schremp LRT/CTRS        Jaimey Franchini 12/25/2016 4:37 PM

## 2016-12-25 NOTE — Plan of Care (Signed)
Problem: Education: Goal: Verbalization of understanding the information provided will improve Outcome: Progressing Patient was able to teach back education material.   Problem: Coping: Goal: Ability to verbalize frustrations and anger appropriately will improve Outcome: Progressing Patient expressive of frustrations and situations in life  Problem: Health Behavior/Discharge Planning: Goal: Identification of resources available to assist in meeting health care needs will improve Outcome: Progressing Patient was responsive to consult with internal medicine and adult protective services

## 2016-12-25 NOTE — Progress Notes (Signed)
Recreation Therapy Notes  Date: 10.29.18  Time: 1:00pm  Location: Craft Room  Behavioral response: N/A  Intervention Topic: Coping skills  Discussion/Intervention: Did not attend group. Clinical Observations/Feedback:  Did not attend group.   Emelio Schneller LRT/CTRS        Octavio Matheney 12/25/2016 4:33 PM

## 2016-12-25 NOTE — Progress Notes (Addendum)
[facility] MD Progress Note  12/25/2016 10:19 AM Debra [name]  MRN:  [id number]   Subjective:  History reviewed with patient today. She states that she has long history of depression and suicidal thoughts. She states that she has had suicidal thoughts most of her life. She states that these thoughts have worsened over the past month due to a recent diagnosis of MS. She states that she went to the ED because she woke up one morning and was not able to walk. They saw multiple lesions on her spine and this is when she got the diagnosis. She was admitted to PM&R services. She states that she has been very depressed because her boyfriend told her that she cannot live with him anymore because he is not able to take care of her. She states that she has 2 adult children but they do not have room at their houses either. She states that this has made her feel very hopeless, helpless, and "my future looks very bleak." She state that she was starting to have more suicidal thoughts with plan to overdose on my pills. She states. 'I have lots of pills so that's what I thought would be the easiest."  She told the nurse who then called the police and brought her to the ED. She states that she is feeling very depressed because she cannot work. She was denied disability in the past but is in the process of reapplying. She states that if she had disability this would make things easier for her. She states that she feels like a burden to her family and does not want to stay with them. She was started on Cymbalta over the weekend and is tolerating it well.  She has multiple medical problems that are poorly managed at this time.   Principal Problem: Major depressive disorder, single episode, severe without psychosis (HCC) Diagnosis:   Patient Active Problem List   Diagnosis Date Noted  . MDD (major depressive disorder), recurrent severe, without psychosis (HCC) [F33.2]   . Major depressive disorder, single episode, severe  without psychosis (HCC) [F32.2] 12/22/2016  . Multiple joint pain [M25.50]   . HTN (hypertension) [I10]   . Acute pain of left knee [M25.562]   . Neurogenic bladder [N31.9]   . Acute lower UTI [N39.0]   . Labile blood pressure [R09.89]   . Acute blood loss anemia [D62]   . Leukocytosis [D72.829]   . Labile blood glucose [R73.09]   . Hypertensive crisis [I16.9]   . Hypoglycemia [E16.2]   . Poorly controlled type 2 diabetes mellitus with peripheral neuropathy (HCC) [E11.42, E11.65]   . Uncontrolled hypertension [I10]   . Sleep disturbance [G47.9]   . Multiple sclerosis (HCC) [G35] 11/22/2016  . Thoracic root lesion [G54.3] 11/22/2016  . MS (multiple sclerosis) (HCC) [G35]   . Neuropathic pain [M79.2]   . Uncontrolled type 2 diabetes mellitus with peripheral neuropathy (HCC) [E11.42, E11.65]   . Urinary retention [R33.9]   . Medically noncompliant [Z91.19]   . Leg weakness, bilateral [R29.898] 11/20/2016  . Diabetic ketoacidosis without coma associated with type 1 diabetes mellitus (HCC) [E10.10]   . Leg pain [M79.606]   . History of CVA (cerebrovascular accident) [Z86.73]   . 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 Benign essential HTN [I10]   . Hypertensive urgency [I16.0]   . AKI (acute kidney injury) (HCC) [N17.9]   . Fibromyalgia [M79.7] 11/07/2016  . Back pain [M54.9] 10/31/2016  .  Stable angina pectoris (HCC) [I20.8]   . Vitamin D deficiency [E55.9] 08/31/2014  . 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 Hyperlipidemia [E78.5] 10/06/2013  . Depression [F32.9] 10/16/2012  . Bilateral knee pain [M25.561, M25.562] 02/16/2012  . Chronic diarrhea [K52.9] 10/19/2010  . Peripheral neuropathy [G62.9] 08/19/2010  . Type 1 diabetes mellitus (HCC) [E10.9] 07/06/2010  . Hypertension [I10] 07/06/2010  . Asthma [J45.909] 07/06/2010  . Migraine [G43.909] 07/06/2010   Total Time spent  with patient: 30 minutes  Plus 20 minutes of extensive chart review and coordination with other specialities due to multiple medical comorbidities  Past Psychiatric History: See H&P. She denies history of suicide attempts.   Past Medical History:  Past Medical History:  Diagnosis Date  . Adenomatous colonic polyps   . Anemia    2005  . Anxiety    1990  . Arthritis   . Asthma    2000  . Cataract   . Depression 1990  . Difficult intubation    narrow airway  . Gastroesophageal Reflux Disease (GERD)   . Heart murmur    Birth  . Hyperlipidemia    2005  . Hypertension    1998  . Internal hemorrhoids 04/24/06   on colonoscopy  . Migraine   . Neuropathy of the hands & feet   . Restless Leg Syndrome   . Right Ankle Fracture 10/06/2013  . Sleep paralysis   . Stable angina pectoris    2007: cath showing normal cors.   . Stroke 1990  . Type I Diabetes Mellitus 1988    Past Surgical History:  Procedure Laterality Date  . bilateral foot surgery    . BREAST SURGERY Left    biopsy left breast  . CARDIAC CATHETERIZATION  2007   around 2007 or 2008  . CESAREAN SECTION    . CHOLECYSTECTOMY    . COLONOSCOPY W/ BIOPSIES AND POLYPECTOMY    . DILATION AND CURETTAGE OF UTERUS  12/29/2010   Surgeon: Tereso Newcomer, MD;  Location: [facility];  Service: Gynecology  . ENDOMETRIAL ABLATION W/ NOVASURE N/A 12/2009  . EYE SURGERY    . HYSTEROSCOPY  12/29/2010   Procedure: HYSTEROSCOPY WITH HYDROTHERMAL ABLATION;  Surgeon: Tereso Newcomer, MD;  Location: [facility];  Service: Gynecology;;  . IUD REMOVAL  12/29/2010   Procedure: INTRAUTERINE DEVICE (IUD) REMOVAL;  Surgeon: Tereso Newcomer, MD;  Location: [facility];  Service: Gynecology;  Laterality: N/A;  . LAPAROSCOPIC TUBAL LIGATION  12/29/2010   Procedure: LAPAROSCOPIC TUBAL LIGATION;  Surgeon: Tereso Newcomer, MD;  Location: [facility];  Service: Gynecology;  Laterality: Bilateral;  . ORIF ANKLE FRACTURE Right 10/09/2013   Procedure: Open reduction  internal fixation right ankle;  Surgeon: Mable Paris, MD;  Location: [facility];  Service: Orthopedics;  Laterality: Right;  Open reduction internal fixation right ankle  . TRIGGER FINGER RELEASE     x 3   Family History:  Family History  Problem Relation Age of Onset  . Hypertension Mother   . Kidney disease Mother   . Hypertension Father   . Breast cancer Maternal Grandmother   . Prostate cancer Maternal Grandfather   . Ovarian cancer Paternal Grandmother   . Prostate cancer Paternal Grandfather   . Colon cancer Maternal Uncle        Family history of malignant neoplasm of gastrointestinal tract   Family Psychiatric  History: See H&P Social History:  History  Alcohol Use No  History  Drug Use No    Comment: drug addict    Social History   Social History  . Marital status: Divorced    Spouse name: N/A  . Number of children: 2  . Years of education: 14   Occupational History  . Unemployed    Social History Main Topics  . Smoking status: Never Smoker  . Smokeless tobacco: Never Used  . Alcohol use No  . Drug use: No     Comment: drug addict  . Sexual activity: Yes    Birth control/ protection: Other-see comments     Comment: pt states she had ablation in 2011   Other Topics Concern  . None   Social History Narrative   Lost medicaid about 2009 ish when her youngest child turned 3318. Has adult children. Lives with boyfriend who financially supports her. Has attempted to get disability but has been turned down.      2-3 caffeine drinks daily     Sleep: Fair  Appetite:  Fair  Current Medications: Current Facility-Administered Medications  Medication Dose Route Frequency Provider Last Rate Last Dose  . acetaminophen (TYLENOL) tablet 650 mg  650 mg Oral Q6H PRN Pucilowska, Jolanta B, MD   650 mg at 12/24/16 2032  . albuterol (PROVENTIL HFA;VENTOLIN HFA) 108 (90 Base) MCG/ACT inhaler 2 puff  2 puff Inhalation Q6H PRN Pucilowska, Jolanta B, MD      .  alum & mag hydroxide-simeth (MAALOX/MYLANTA) 200-200-20 MG/5ML suspension 30 mL  30 mL Oral Q4H PRN Pucilowska, Jolanta B, MD      . amLODipine (NORVASC) tablet 10 mg  10 mg Oral Daily Pucilowska, Jolanta B, MD   10 mg at 12/25/16 0754  . aspirin EC tablet 81 mg  81 mg Oral Daily Pucilowska, Jolanta B, MD   81 mg at 12/25/16 0754  . atorvastatin (LIPITOR) tablet 40 mg  40 mg Oral q1800 Pucilowska, Jolanta B, MD   40 mg at 12/24/16 1634  . bismuth subsalicylate (PEPTO BISMOL) chewable tablet 524 mg  524 mg Oral Q1H PRN McNew, Holly R, MD      . DULoxetine (CYMBALTA) DR capsule 30 mg  30 mg Oral Daily Beverly SessionsSubedi, Jagannath, MD   30 mg at 12/25/16 0754  . etodolac (LODINE) capsule 200 mg  200 mg Oral BID Pucilowska, Jolanta B, MD   200 mg at 12/25/16 0752  . gabapentin (NEURONTIN) tablet 600 mg  600 mg Oral TID Pucilowska, Jolanta B, MD   600 mg at 12/25/16 0754  . hydrochlorothiazide (MICROZIDE) capsule 12.5 mg  12.5 mg Oral Daily Pucilowska, Jolanta B, MD   12.5 mg at 12/25/16 0753  . insulin aspart (novoLOG) injection 0-5 Units  0-5 Units Subcutaneous QHS Beverly SessionsSubedi, Jagannath, MD   3 Units at 12/24/16 2133  . insulin aspart (novoLOG) injection 0-9 Units  0-9 Units Subcutaneous TID WC Beverly SessionsSubedi, Jagannath, MD   9 Units at 12/25/16 0756  . insulin aspart (novoLOG) injection 8 Units  8 Units Subcutaneous TID WC McNew, Holly R, MD      . insulin glargine (LANTUS) injection 18 Units  18 Units Subcutaneous BID McNew, Holly R, MD      . lisinopril (PRINIVIL,ZESTRIL) tablet 20 mg  20 mg Oral Daily Pucilowska, Jolanta B, MD   20 mg at 12/25/16 0753  . magnesium hydroxide (MILK OF MAGNESIA) suspension 30 mL  30 mL Oral Daily PRN Pucilowska, Jolanta B, MD      . metoprolol succinate (TOPROL-XL) 24 hr tablet  100 mg  100 mg Oral Daily Pucilowska, Jolanta B, MD   100 mg at 12/25/16 0751  . nitroGLYCERIN (NITROSTAT) SL tablet 0.4 mg  0.4 mg Sublingual Q5 min PRN Pucilowska, Jolanta B, MD      . pantoprazole (PROTONIX) EC  tablet 40 mg  40 mg Oral Daily Pucilowska, Jolanta B, MD   40 mg at 12/25/16 0753  . traZODone (DESYREL) tablet 100 mg  100 mg Oral QHS PRN Pucilowska, Jolanta B, MD   100 mg at 12/24/16 2133    Lab Results:  Results for orders placed or performed during the hospital encounter of 12/22/16 (from the past 48 hour(s))  Glucose, capillary     Status: Abnormal   Collection Time: 12/23/16 11:34 AM  Result Value Ref Range   Glucose-Capillary 126 (H) 65 - 99 mg/dL  Glucose, capillary     Status: Abnormal   Collection Time: 12/23/16  4:22 PM  Result Value Ref Range   Glucose-Capillary 220 (H) 65 - 99 mg/dL  Glucose, capillary     Status: Abnormal   Collection Time: 12/23/16  8:38 PM  Result Value Ref Range   Glucose-Capillary 171 (H) 65 - 99 mg/dL   Comment 1 Notify RN   Urine Culture     Status: Abnormal (Preliminary result)   Collection Time: 12/24/16 12:28 AM  Result Value Ref Range   Specimen Description URINE, CLEAN CATCH    Special Requests NONE    Culture >=100,000 COLONIES/mL GRAM NEGATIVE RODS (A)    Report Status PENDING   Glucose, capillary     Status: Abnormal   Collection Time: 12/24/16  7:07 AM  Result Value Ref Range   Glucose-Capillary 342 (H) 65 - 99 mg/dL   Comment 1 Notify RN   Glucose, capillary     Status: Abnormal   Collection Time: 12/24/16 11:56 AM  Result Value Ref Range   Glucose-Capillary 242 (H) 65 - 99 mg/dL  Glucose, capillary     Status: Abnormal   Collection Time: 12/24/16  4:24 PM  Result Value Ref Range   Glucose-Capillary 262 (H) 65 - 99 mg/dL  Glucose, capillary     Status: Abnormal   Collection Time: 12/24/16  8:57 PM  Result Value Ref Range   Glucose-Capillary 267 (H) 65 - 99 mg/dL   Comment 1 Notify RN   Glucose, capillary     Status: Abnormal   Collection Time: 12/25/16  6:53 AM  Result Value Ref Range   Glucose-Capillary 356 (H) 65 - 99 mg/dL    Blood Alcohol level:  Lab Results  Component Value Date   ETH <10 12/20/2016   ETH   12/09/2006    <5        LOWEST DETECTABLE LIMIT FOR SERUM ALCOHOL IS 11 mg/dL FOR MEDICAL PURPOSES ONLY    Metabolic Disorder Labs: Lab Results  Component Value Date   HGBA1C 11.4 (H) 12/23/2016   MPG 280.48 12/23/2016   No results found for: PROLACTIN Lab Results  Component Value Date   CHOL 105 12/23/2016   TRIG 103 12/23/2016   HDL 46 12/23/2016   CHOLHDL 2.3 12/23/2016   VLDL 21 12/23/2016   LDLCALC 38 12/23/2016   LDLCALC 112 (H) 04/21/2015    Physical Findings: AIMS: Facial and Oral Movements Muscles of Facial Expression: None, normal Lips and Perioral Area: None, normal Jaw: None, normal Tongue: None, normal,Extremity Movements Upper (arms, wrists, hands, fingers): None, normal Lower (legs, knees, ankles, toes): None, normal,  , Overall Severity  Severity of abnormal movements (highest score from questions above): None, normal Incapacitation due to abnormal movements: None, normal Patient's awareness of abnormal movements (rate only patient's report): No Awareness, Dental Status Current problems with teeth and/or dentures?: No Does patient usually wear dentures?: No   Musculoskeletal: Strength & Muscle Tone: BLE weakness due to MS Gait & Station: unsteady Patient leans: Front  Psychiatric Specialty Exam: Physical Exam  Nursing note and vitals reviewed.   Review of Systems  Gastrointestinal: Positive for diarrhea.  Genitourinary: Positive for frequency and urgency.  All other systems reviewed and are negative.   Blood pressure (!) 195/89, pulse 76, temperature 97.7 F (36.5 C), resp. rate 18, height 5\' 4"  (1.626 m), weight 68 kg (150 lb), SpO2 100 %.Body mass index is 25.75 kg/m.  General Appearance: Disheveled  Eye Contact:  Poor  Speech:  Slow  Volume:  Decreased  Mood:  Depressed  Affect:  Flat  Thought Process:  Goal Directed  Orientation:  Full (Time, Place, and Person)  Thought Content:  Negative  Suicidal Thoughts:  Yes.  with intent/plan   Homicidal Thoughts:  No  Memory:  Immediate;   Fair  Judgement:  Fair  Insight:  Fair  Psychomotor Activity: Psychomotor slowed  Concentration:  Concentration: Fair  Recall:  [employer] of Knowledge:  Fair  Language:  Fair  Akathisia:  No      Assets:  Communication Skills Resilience  ADL's:  Impaired  Cognition:  WNL  Sleep:  Number of Hours: 5.45     Treatment Plan Summary: 51 yo female with history of depression and suicidal thoughts presents due to worsening depression and SI related to multiple psychosocial stressors including recent diagnosis of MS and multiple medical comorbidity. She reports feeling very hopeless as she has no where to live and has no income or insurance.  Plan:  MDD -Continue Cymbalta 30 mg daily . This was increased yesterday   HTN -Amlodipine 10 mg daily, HCTZ 12.5 mg daily, Lisinopril 20 mg daily, metoprolol 100 mg daily -BP still poorly controlled. Will consult IM for recommendations. Appreciate their recommendations  DM Type I -Diabetes care coordinator on board. Recommended increase Lantus to 18 units and aspart 8 units TID  Multiple Sclerosis -Pt on 1:1 for fall risk -Consulted PT for evaluation and treatment  HLD -Lipitor  Neurogenic bladder -Will monitor and consider ordering bladder scans  Social/Dispo -Pt is newly homeless as her boyfriend can no longer take care of her. She has 2 adult children that she identifies as her support system. Pt is in process of applying for disability.  Greater than 50% of face to face time with patient was spent on counseling and coordination of care. We discussed history, medications including Cymbalta, social history and support and discharge planning. Extensive chart review done including recent hospitalization and rehabilitation services.   Haskell Riling, MD 12/25/2016, 10:19 AM

## 2016-12-25 NOTE — Progress Notes (Signed)
Inpatient Diabetes Program Recommendations  AACE/ADA: New Consensus Statement on Inpatient Glycemic Control (2015)  Target Ranges:  Prepandial:   less than 140 mg/dL      Peak postprandial:   less than 180 mg/dL (1-2 hours)      Critically ill patients:  140 - 180 mg/dL  Results for Debra [name], Debra [name] (MRN [id number]) as of 12/25/2016 07:53  Ref. Range 12/24/2016 07:07 12/24/2016 11:56 12/24/2016 16:24 12/24/2016 20:57 12/25/2016 06:53  Glucose-Capillary Latest Ref Range: 65 - 99 mg/dL 902 (H) 409 (H) 735 (H) 267 (H) 356 (H)  Results for Debra [name], Debra [name] (MRN [id number]) as of 12/25/2016 07:53  Ref. Range 10/31/2016 10:36 12/23/2016 07:12  Hemoglobin A1C Latest Ref Range: 4.8 - 5.6 % >14.0 11.4 (H)    Review of Glycemic Control  Diabetes history: DM1 Outpatient Diabetes medications: Lantus 17 units BID, Novolog 0-10 units TID with meals Current orders for Inpatient glycemic control: Lantus 15 units BID, Novolog 6 units TID with meals, Novolog 0-9 units TID with meals, Novolog 0-5 units QHS  Inpatient Diabetes Program Recommendations:  Insulin - Basal: Please consider increasing Lantus to 18 units BID. Insulin - Meal Coverage: Please consider increasing meal coverage to Novolog 8 units TID with meals. HgbA1C: A1C 11.4% on 12/23/16 indicating an average glucose of 280 mg/dl over the past 2-3 months.  Thanks, Orlando Penner, RN, MSN, CDE Diabetes Coordinator Inpatient Diabetes Program [phone] (Team Pager from 8am to 5pm)

## 2016-12-26 ENCOUNTER — Ambulatory Visit: Payer: Self-pay

## 2016-12-26 DIAGNOSIS — G35 Multiple sclerosis: Secondary | ICD-10-CM

## 2016-12-26 LAB — BASIC METABOLIC PANEL
ANION GAP: 7 (ref 5–15)
BUN: 23 mg/dL — ABNORMAL HIGH (ref 6–20)
CALCIUM: 9 mg/dL (ref 8.9–10.3)
CHLORIDE: 103 mmol/L (ref 101–111)
CO2: 27 mmol/L (ref 22–32)
CREATININE: 1.52 mg/dL — AB (ref 0.44–1.00)
GFR calc non Af Amer: 39 mL/min — ABNORMAL LOW (ref >60)
GFR, EST AFRICAN AMERICAN: 45 mL/min — AB (ref >60)
Glucose, Bld: 194 mg/dL — ABNORMAL HIGH (ref 65–99)
Potassium: 4.7 mmol/L (ref 3.5–5.1)
SODIUM: 137 mmol/L (ref 135–145)

## 2016-12-26 LAB — GLUCOSE, CAPILLARY
GLUCOSE-CAPILLARY: 143 mg/dL — AB (ref 65–99)
Glucose-Capillary: 200 mg/dL — ABNORMAL HIGH (ref 65–99)
Glucose-Capillary: 202 mg/dL — ABNORMAL HIGH (ref 65–99)
Glucose-Capillary: 188 mg/dL — ABNORMAL HIGH (ref 65–99)

## 2016-12-26 LAB — URINE CULTURE

## 2016-12-26 LAB — CK: CK TOTAL: 121 U/L (ref 38–234)

## 2016-12-26 MED ORDER — TRAZODONE HCL 50 MG PO TABS
50.0000 mg | ORAL_TABLET | Freq: Every evening | ORAL | Status: DC | PRN
  Administered 2016-12-27: 50 mg via ORAL
  Filled 2016-12-26 (×2): qty 1

## 2016-12-26 MED ORDER — CIPROFLOXACIN HCL 500 MG PO TABS
500.0000 mg | ORAL_TABLET | Freq: Two times a day (BID) | ORAL | Status: DC
Start: 2016-12-26 — End: 2017-01-01
  Administered 2016-12-26 – 2017-01-01 (×13): 500 mg via ORAL
  Filled 2016-12-26 (×15): qty 1

## 2016-12-26 MED ORDER — INSULIN GLARGINE 100 UNIT/ML ~~LOC~~ SOLN
23.0000 [IU] | Freq: Two times a day (BID) | SUBCUTANEOUS | Status: DC
  Administered 2016-12-26 – 2016-12-28 (×4): 23 [IU] via SUBCUTANEOUS
  Filled 2016-12-26 (×5): qty 0.23

## 2016-12-26 MED ORDER — DIPHENOXYLATE-ATROPINE 2.5-0.025 MG PO TABS: 1.0000 | ORAL_TABLET | Freq: Four times a day (QID) | ORAL | Status: DC | PRN

## 2016-12-26 MED ORDER — HYDRALAZINE HCL 50 MG PO TABS
25.0000 mg | ORAL_TABLET | Freq: Three times a day (TID) | ORAL | Status: DC
  Administered 2016-12-26 – 2016-12-29 (×9): 25 mg via ORAL
  Filled 2016-12-26 (×9): qty 1

## 2016-12-26 MED ORDER — TAMSULOSIN HCL 0.4 MG PO CAPS
0.8000 mg | ORAL_CAPSULE | Freq: Every day | ORAL | Status: DC
  Administered 2016-12-26 – 2016-12-31 (×6): 0.8 mg via ORAL
  Filled 2016-12-26 (×6): qty 2

## 2016-12-26 MED ORDER — BETHANECHOL CHLORIDE 10 MG PO TABS
50.0000 mg | ORAL_TABLET | Freq: Three times a day (TID) | ORAL | Status: DC
  Administered 2016-12-26 – 2017-01-01 (×18): 50 mg via ORAL
  Filled 2016-12-26 (×18): qty 5
  Filled 2016-12-26: qty 2
  Filled 2016-12-26 (×3): qty 5

## 2016-12-26 MED ORDER — INSULIN GLARGINE 100 UNIT/ML ~~LOC~~ SOLN
20.0000 [IU] | Freq: Two times a day (BID) | SUBCUTANEOUS | Status: DC
  Filled 2016-12-26: qty 0.2

## 2016-12-26 NOTE — Progress Notes (Signed)
Patient out of room in dayroom with sitter at side. Snack eaten, that was appropriate for a diabetic. No complaints of voiced.

## 2016-12-26 NOTE — Progress Notes (Signed)
Inpatient Diabetes Program Recommendations  AACE/ADA: New Consensus Statement on Inpatient Glycemic Control (2015)  Target Ranges:  Prepandial:   less than 140 mg/dL      Peak postprandial:   less than 180 mg/dL (1-2 hours)      Critically ill patients:  140 - 180 mg/dL   Results for Debra [name], Debra [name] (MRN [id number]) as of 12/26/2016 07:42  Ref. Range 12/25/2016 06:53 12/25/2016 11:03 12/25/2016 16:27 12/25/2016 21:08 12/26/2016 07:06  Glucose-Capillary Latest Ref Range: 65 - 99 mg/dL 622 (H) 297 (H) 989 (H) 204 (H) 200 (H)   Review of Glycemic Control  Diabetes history: DM1 Outpatient Diabetes medications: Lantus 17 units BID, Novolog 0-10 units TID with meals Current orders for Inpatient glycemic control: Lantus 18 units BID, Novolog 8 units TID with meals, Novolog 0-9 units TID with meals, Novolog 0-5 units QHS  Inpatient Diabetes Program Recommendations:  Insulin - Basal: Please consider increasing Lantus to 20 units BID. HgbA1C: A1C 11.4% on 12/23/16 indicating an average glucose of 280 mg/dl over the past 2-3 months.  Thanks, Orlando Penner, RN, MSN, CDE Diabetes Coordinator Inpatient Diabetes Program [phone] (Team Pager from 8am to 5pm)

## 2016-12-26 NOTE — Progress Notes (Signed)
D: Pt is alert and oriented x 4, no distress noted, denies SI/HI/AVH. Patient's mood is pleasant and cooperative with treatment plan. Patient is on 1:1 for safety no distress noted.  Patient's thoughts process is organized and speech is coherent. Patient is interacting with peers and staff appropriately.  A: Pt was offered support and encouragement and given scheduled medications. Pt was encouraged to attend groups, and 15 minute checks were done for safety.  R:Pt attends groups and interacts well with peers and staff, patient is complaint with  Medication and  receptive to treatment. 15 minutes safety checks maintained, will continue to monitor.

## 2016-12-26 NOTE — Progress Notes (Signed)
Patient lying in bed reading her book, sitter at bedside. No complaints of voiced. No episodes of loose stools note this shift.

## 2016-12-26 NOTE — Progress Notes (Signed)
Patient in room reading book in her bed. Sitter at bedside, no complaints of voiced at this time.

## 2016-12-26 NOTE — Progress Notes (Signed)
Patient attended group this morning at 1130 a.m, appropriately interacted during the session per recreational therapist. After group was over, patient returned to room to eat her lunch. Remains with 1:1 sitter.

## 2016-12-26 NOTE — Progress Notes (Addendum)
[facility] medical consult follow-up for the patient [facility]   PATIENT NAME: Debra [name]    MR#:  [id number]  DATE OF BIRTH:  1965-12-27  SUBJECTIVE: Medical consult follow-up for this patient with hypertension, diabetes.blood blood sugars and also high blood pressure are better  CHIEF COMPLAINT:  No chief complaint on file.   REVIEW OF SYSTEMS:   ROS CONSTITUTIONAL: No fever, fatigue or weakness.  EYES: No blurred or double vision.  EARS, NOSE, AND THROAT: No tinnitus or ear pain.  RESPIRATORY: No cough, shortness of breath, wheezing or hemoptysis.  CARDIOVASCULAR: No chest pain, orthopnea, edema.  GASTROINTESTINAL: No nausea, vomiting, diarrhea or abdominal pain.  GENITOURINARY: No dysuria, hematuria.  ENDOCRINE: No polyuria, nocturia,  HEMATOLOGY: No anemia, easy bruising or bleeding SKIN: No rash or lesion. MUSCULOSKELETAL: No joint pain or arthritis.   NEUROLOGIC: No tingling, numbness, weakness.  PSYCHIATRY: No anxiety or depression.   DRUG ALLERGIES:   Allergies  Allergen Reactions  . Penicillins Anaphylaxis, Nausea And Vomiting and Rash    Has patient had a PCN reaction causing immediate rash, facial/tongue/throat swelling, SOB or lightheadedness with hypotension: Yes Has patient had a PCN reaction causing severe rash involving mucus membranes or skin necrosis: Yes Has patient had a PCN reaction that required hospitalization No Has patient had a PCN reaction occurring within the last 10 years: Yes If all of the above answers are "NO", then may proceed with Cephalosporin use.   . Pollen Extract     Seasonal Allergies  . Tape Rash    VITALS:  Blood pressure (!) 198/68, pulse 68, temperature 98.9 F (37.2 C), temperature source Oral, resp. rate 16, height 5\' 4"  (1.626 m), weight 68 kg (150 lb), SpO2 99 %.  PHYSICAL EXAMINATION:  GENERAL:  51 y.o.-year-old patient lying in the bed with no acute distress.  EYES: Pupils  equal, round, reactive to light and accommodation. No scleral icterus. Extraocular muscles intact.  HEENT: Head atraumatic, normocephalic. Oropharynx and nasopharynx clear.  NECK:  Supple, no jugular venous distention. No thyroid enlargement, no tenderness.  LUNGS: Normal breath sounds bilaterally, no wheezing, rales,rhonchi or crepitation. No use of accessory muscles of respiration.  CARDIOVASCULAR: S1, S2 normal. No murmurs, rubs, or gallops.  ABDOMEN: Soft, nontender, nondistended. Bowel sounds present. No organomegaly or mass.  EXTREMITIES: No pedal edema, cyanosis, or clubbing.  NEUROLOGIC: Cranial nerves II through XII are intact. Muscle strength 5/5 in all extremities. Sensation intact. Gait not checked.  PSYCHIATRIC: The patient is alert and oriented x 3.  SKIN: No obvious rash, lesion, or ulcer.    LABORATORY PANEL:   CBC  Recent Labs Lab 12/20/16 1955  WBC 9.5  HGB 11.9*  HCT 35.2*  PLT 348   ------------------------------------------------------------------------------------------------------------------  Chemistries   Recent Labs Lab 12/20/16 1955  12/26/16 0711  NA 137  --  137  K 3.8  --  4.7  CL 99*  --  103  CO2 28  --  27  GLUCOSE 130*  < > 194*  BUN 19  --  23*  CREATININE 1.14*  --  1.52*  CALCIUM 9.6  --  9.0  AST 22  --   --   ALT 27  --   --   ALKPHOS 128*  --   --   BILITOT 1.0  --   --   < > = values in this interval not displayed. ------------------------------------------------------------------------------------------------------------------  Cardiac Enzymes No results for input(s): TROPONINI in the  last 168 hours. ------------------------------------------------------------------------------------------------------------------  RADIOLOGY:  No results found.  EKG:   Orders placed or performed during the hospital encounter of 11/15/16  . ED EKG  . ED EKG  . ED EKG  . ED EKG  . EKG 12-Lead  . EKG 12-Lead    ASSESSMENT AND PLAN:   #79. 51 year old female patient admitted because of depression, medical  Consult follow-up 2.uncontrolled; hypertension: Still slightly high, #3 continue hydrochlorothiazide, amlodipine, beta-blockers, add hydralazine.  Kl continue Levemir, sliding scale and strength coverage Ebsiella UTI: Continue Cipro 4.  Diabetes mellitus type 2: Continue Lantus, mealtime insulin. 5.  Peripheral neuropathy: Continue Neurontin. 6.  History of multiple sclerosis: Seen by Dr. Thad Ranger from neurology, patient can have follow-up with outpatient neurologist, no intervention while she is in the hospital.  All the records are reviewed and case discussed with Care Management/Social Workerr. Management plans discussed with the patient, family and they are in agreement.  CODE STATUS: full  TOTAL TIME TAKING CARE OF THIS PATIENT:35 minutes.     Katha Hamming M.D on 12/26/2016 at 4:54 PM  Between 7am to 6pm - Pager - [phone]  After 6pm go to [email or web address] - password [email or web address] [facility]  [place] [facility]  Office  [phone]  CC: Primary care physician; Nyra Market, MD   Note: This dictation was prepared with Dragon dictation along with smaller phrase technology. Any transcriptional errors that result from this process are unintentional. By mobility 1 and [place] but

## 2016-12-26 NOTE — BHH Counselor (Addendum)
Complex Case Management Note   Does the patient have a legal guardian (if yes, please add name and contact information of guardian):  No.  [place] [facility] is following case   Has guardianship paperwork been requested/received?: No. NA  Does the patient have an IDD diagnosis?: No.   Does the patient have a care coordinator?: No.   If there is no care coordinator, has a referral been made?:  Not eligible as patient does not have Medicaid at this time  [employer] (and contact information if applicable): son and daughter, neither of whom are able to house at discharge due to lease restrictions  Outside agencies involved: [facility] referral made to [facility] ([place] [phone]; [facility] was engaged after DC from inpatient rehab  Medical needs/complications (eg. Diabetic, C-PAP, O2, ADLs): Yes,  incontinent of bowel and bladder, requires wheelchair and walker for mobility, PT has assessed and recommended HH PT   Which ALF/FCHs has patient been referred to (i.e. Name of facility, contact, and date of referral):  Name of Facility Contact Information Date of referral What else is needed?                        Other: 12/28/16: Phone call with Maurice March, RN and Marlene Lard of financial counseling regarding pt being eligible for medicaid/disability.  Bjorn Loser is not the assigned caseworker, which is Fleet Contras, but Fleet Contras is not back until Monday.  Rhonda or coworker will review pt and get back to [place].   Sallee Lange, LCSW 12/26/2016 11:37 AM

## 2016-12-26 NOTE — Progress Notes (Signed)
Patient is bed resting with sitter at bedside, no complaints of voiced.

## 2016-12-26 NOTE — Progress Notes (Signed)
Patient in bed resting with eyes closed, sitter remains at bedside.

## 2016-12-26 NOTE — Progress Notes (Signed)
Recreation Therapy Notes   Date: 10.30.18  Time: 9:30am  Location: Craft Room  Behavioral response: Appropriate  Intervention Topic: Team building  Discussion/Intervention: Group content on today was focused on team building. The group identified what team building is. Individuals described who is a part of their team. Patients expressed why they thought team building is important. The group stated reasons why they thought it was easier to work with a Comptroller team. Individuals discussed some positives and negatives of working with a team. Patients gave examples of past experiences they had while working with a team. The group participated in the intervention "Save the Rossie", patients were in groups and had to keep the balls on the surface given.   Clinical Observations/Feedback:  Patient came to group late for unknown reasons. She participated in the intervention with her peers. Individual was appropriate with staff and peers during group.   Mahayla Haddaway LRT/CTRS        Thelmer Legler 12/26/2016 11:50 AM

## 2016-12-26 NOTE — Consult Note (Signed)
Reason for Consult:MS Referring Physician: [place]  CC: LE weakness  HPI: Debra [name] is an 51 y.o. female with recent diagnosis of MS admitted for depression.  Was diagnosed with MS last month.  Received Solumedrol for 5 days.  Reports that since discharge she has not had any improvement.  Also has not had any PT since discharge.  Reports she has had LE numbness for years.  Reports LE weakness that was progressive for months prior to admission.  Bowel and bladder incontinence started about 2 weeks prior to admission.  Also reports visual blurring.    Past Medical History:  Diagnosis Date  . Adenomatous colonic polyps   . Anemia    2005  . Anxiety    1990  . Arthritis   . Asthma    2000  . Cataract   . Depression 1990  . Difficult intubation    narrow airway  . Gastroesophageal Reflux Disease (GERD)   . Heart murmur    Birth  . Hyperlipidemia    2005  . Hypertension    1998  . Internal hemorrhoids 04/24/06   on colonoscopy  . Migraine   . Neuropathy of the hands & feet   . Restless Leg Syndrome   . Right Ankle Fracture 10/06/2013  . Sleep paralysis   . Stable angina pectoris    2007: cath showing normal cors.   . Stroke 1990  . Type I Diabetes Mellitus 1988    Past Surgical History:  Procedure Laterality Date  . bilateral foot surgery    . BREAST SURGERY Left    biopsy left breast  . CARDIAC CATHETERIZATION  2007   around 2007 or 2008  . CESAREAN SECTION    . CHOLECYSTECTOMY    . COLONOSCOPY W/ BIOPSIES AND POLYPECTOMY    . DILATION AND CURETTAGE OF UTERUS  12/29/2010   Surgeon: Tereso Newcomer, MD;  Location: [facility];  Service: Gynecology  . ENDOMETRIAL ABLATION W/ NOVASURE N/A 12/2009  . EYE SURGERY    . HYSTEROSCOPY  12/29/2010   Procedure: HYSTEROSCOPY WITH HYDROTHERMAL ABLATION;  Surgeon: Tereso Newcomer, MD;  Location: [facility];  Service: Gynecology;;  . IUD REMOVAL  12/29/2010   Procedure: INTRAUTERINE DEVICE (IUD) REMOVAL;  Surgeon: Tereso Newcomer, MD;  Location: [facility];  Service: Gynecology;  Laterality: N/A;  . LAPAROSCOPIC TUBAL LIGATION  12/29/2010   Procedure: LAPAROSCOPIC TUBAL LIGATION;  Surgeon: Tereso Newcomer, MD;  Location: [facility];  Service: Gynecology;  Laterality: Bilateral;  . ORIF ANKLE FRACTURE Right 10/09/2013   Procedure: Open reduction internal fixation right ankle;  Surgeon: Mable Paris, MD;  Location: [facility] OR;  Service: Orthopedics;  Laterality: Right;  Open reduction internal fixation right ankle  . TRIGGER FINGER RELEASE     x 3    Family History  Problem Relation Age of Onset  . Hypertension Mother   . Kidney disease Mother   . Hypertension Father   . Breast cancer Maternal Grandmother   . Prostate cancer Maternal Grandfather   . Ovarian cancer Paternal Grandmother   . Prostate cancer Paternal Grandfather   . Colon cancer Maternal Uncle        Family history of malignant neoplasm of gastrointestinal tract    Social History:  reports that she has never smoked. She has never used smokeless tobacco. She reports that she does not drink alcohol or use drugs.  Allergies  Allergen Reactions  . Penicillins Anaphylaxis, Nausea And Vomiting and Rash  Has patient had a PCN reaction causing immediate rash, facial/tongue/throat swelling, SOB or lightheadedness with hypotension: Yes Has patient had a PCN reaction causing severe rash involving mucus membranes or skin necrosis: Yes Has patient had a PCN reaction that required hospitalization No Has patient had a PCN reaction occurring within the last 10 years: Yes If all of the above answers are "NO", then may proceed with Cephalosporin use.   . Pollen Extract     Seasonal Allergies  . Tape Rash    Medications:  I have reviewed the patient's current medications. Prior to Admission:  Prescriptions Prior to Admission  Medication Sig Dispense Refill Last Dose  . amLODipine (NORVASC) 10 MG tablet Take 1 tablet (10 mg total) by mouth daily. 90  tablet 1 12/25/2016  . aspirin EC 81 MG tablet Take 1 tablet (81 mg total) by mouth daily. 90 tablet 3 12/25/2016  . atorvastatin (LIPITOR) 40 MG tablet Take 1 tablet (40 mg total) by mouth daily. 90 tablet 3 12/25/2016  . bethanechol (URECHOLINE) 25 MG tablet Take 50 mg by mouth 3 (three) times daily.  0 12/25/2016  . bethanechol (URECHOLINE) 50 MG tablet Take 1 tablet (50 mg total) by mouth 3 (three) times daily. 30 tablet 0 12/25/2016  . 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 The patient tests 4 times per day. 1 Device 0 12/25/2016  . gabapentin (NEURONTIN) 300 MG capsule Take 2 capsules (600 mg total) by mouth 3 (three) times daily. 180 capsule 0 12/25/2016  . glucose blood (AGAMATRIX AMP TEST) test strip Use as instructed 100 each 12 12/25/2016  . hydrALAZINE (APRESOLINE) 25 MG tablet Take 3 tablets (75 mg total) by mouth every 8 (eight) hours. 90 tablet 0 12/25/2016  . insulin aspart (NOVOLOG FLEXPEN) 100 UNIT/ML FlexPen Inject 5 Units into the skin 3 (three) times daily with meals. (Patient taking differently: Inject 0-10 Units into the skin 3 (three) times daily with meals. ) 15 mL 11 12/25/2016  . insulin glargine (LANTUS) 100 unit/mL SOPN Inject 0.17 mLs (17 Units total) into the skin 2 (two) times daily. 15 mL 11 12/25/2016  . insulin glargine (LANTUS) 100 unit/mL SOPN Inject 0.17 mLs (17 Units total) into the skin 2 (two) times daily. 15 mL 11 12/25/2016  . lisinopril (PRINIVIL,ZESTRIL) 20 MG tablet Take 1 tablet (20 mg total) by mouth 2 (two) times daily. 60 tablet 0 12/25/2016  . Meloxicam 10 MG CAPS Take 15 mg by mouth every morning. 30 capsule 0 12/25/2016  . metoprolol succinate (TOPROL-XL) 25 MG 24 hr tablet Take 3 tablets (75 mg total) by mouth daily. 30 tablet 0 12/25/2016  . NITROSTAT 0.4 MG SL tablet PLACE 1 TABLET UNDER TONGUE AS NEEDED FOR CHEST PAIN EVERY 5 MINUTES X 3 MAX DOSES.  CALL 911 IF PAIN  PERSISTS  0 12/25/2016  . oxyCODONE 10 MG TABS Take 1 tablet (10 mg total) by mouth every 4 (four) hours as needed for moderate pain. 20 tablet 0 12/25/2016  . pantoprazole (PROTONIX) 40 MG tablet Take 1 tablet (40 mg total) by mouth daily. 30 tablet 0 12/25/2016  . PROVENTIL HFA 108 (90 Base) MCG/ACT inhaler INHALE 1 TO 2 PUFFS BY MOUTH EVERY 6 HOURS AS NEEDED FOR COUGHING, WHEEZING, OR SHORTNESS OF BREATH  0 12/25/2016  . tamsulosin (FLOMAX) 0.4 MG CAPS capsule Take 2 capsules (0.8 mg total) by mouth daily after breakfast. 30 capsule 0 12/25/2016  . traZODone (  DESYREL) 50 MG tablet Take 1 tablet (50 mg total) by mouth at bedtime. 30 tablet 0 12/25/2016   Scheduled: . amLODipine  10 mg Oral Daily  . aspirin EC  81 mg Oral Daily  . atorvastatin  40 mg Oral q1800  . bethanechol  50 mg Oral TID  . ciprofloxacin  500 mg Oral BID  . DULoxetine  30 mg Oral Daily  . etodolac  200 mg Oral BID  . gabapentin  600 mg Oral TID  . hydrochlorothiazide  25 mg Oral Daily  . insulin aspart  0-5 Units Subcutaneous QHS  . insulin aspart  0-9 Units Subcutaneous TID WC  . insulin aspart  8 Units Subcutaneous TID WC  . insulin glargine  20 Units Subcutaneous BID  . lisinopril  20 mg Oral BID  . metoprolol succinate  100 mg Oral Daily  . pantoprazole  40 mg Oral Daily  . tamsulosin  0.8 mg Oral QPC supper    ROS: History obtained from the patient  General ROS: negative for - chills, fatigue, fever, night sweats, weight gain or weight loss Psychological ROS: depression Ophthalmic ROS: blurry vision ENT ROS: negative for - epistaxis, nasal discharge, oral lesions, sore throat, tinnitus or vertigo Allergy and Immunology ROS: negative for - hives or itchy/watery eyes Hematological and Lymphatic ROS: negative for - bleeding problems, bruising or swollen lymph nodes Endocrine ROS: negative for - galactorrhea, hair pattern changes, polydipsia/polyuria or temperature intolerance Respiratory ROS: negative for  - cough, hemoptysis, shortness of breath or wheezing Cardiovascular ROS: negative for - chest pain, dyspnea on exertion, edema or irregular heartbeat Gastrointestinal ROS: stool incontinence Genito-Urinary ROS: incontinence Musculoskeletal ROS: negative for - joint swelling or muscular weakness Neurological ROS: as noted in HPI Dermatological ROS: negative for rash and skin lesion changes  Physical Examination: Blood pressure (!) 198/68, pulse 68, temperature 98.9 F (37.2 C), temperature source Oral, resp. rate 16, height 5\' 4"  (1.626 m), weight 68 kg (150 lb), SpO2 99 %.  HEENT-  Normocephalic, no lesions, without obvious abnormality.  Normal external eye and conjunctiva.  Normal TM's bilaterally.  Normal auditory canals and external ears. Normal external nose, mucus membranes and septum.  Normal pharynx. Cardiovascular- S1, S2 normal, pulses palpable throughout   Lungs- chest clear, no wheezing, rales, normal symmetric air entry Abdomen- soft, non-tender; bowel sounds normal; no masses,  no organomegaly Extremities- mild LE edema Lymph-no adenopathy palpable Musculoskeletal-no joint tenderness, deformity or swelling Skin-warm and dry, no hyperpigmentation, vitiligo, or suspicious lesions  Neurological Examination   Mental Status: Alert, oriented, thought content appropriate.  Speech fluent without evidence of aphasia.  Able to follow 3 step commands without difficulty. Cranial Nerves: II: Discs flat bilaterally; Visual fields grossly normal, pupils equal, round, reactive to light and accommodation III,IV, VI: ptosis not present, extra-ocular motions intact bilaterally V,VII: decrease in the left NLF, facial light touch sensation normal bilaterally VIII: hearing normal bilaterally IX,X: gag reflex present XI: bilateral shoulder shrug XII: tongue extension to the left  Motor: Right : Upper extremity   5-/5    Left:     Upper extremity   5-/5  Lower extremity   3/5     Lower  extremity   3/5 Increased tone in the lower extremities Sensory: Pinprick and light touch decreased in the hands and in the lower extremities up to the umbilicus Deep Tendon Reflexes: 2+ in the upper extremities and absent in the lower extemities Plantars: Right: downgoing   Left: downgoing Cerebellar: Normal  finger-to-nose testing Gait: not tested due to safety concerns    Laboratory Studies:   Basic Metabolic Panel:  Recent Labs Lab 12/20/16 1955 12/22/16 2130  NA 137  --   K 3.8  --   CL 99*  --   CO2 28  --   GLUCOSE 130* 447*  BUN 19  --   CREATININE 1.14*  --   CALCIUM 9.6  --     Liver Function Tests:  Recent Labs Lab 12/20/16 1955  AST 22  ALT 27  ALKPHOS 128*  BILITOT 1.0  PROT 7.5  ALBUMIN 3.7   No results for input(s): LIPASE, AMYLASE in the last 168 hours. No results for input(s): AMMONIA in the last 168 hours.  CBC:  Recent Labs Lab 12/20/16 1955  WBC 9.5  HGB 11.9*  HCT 35.2*  MCV 87.8  PLT 348    Cardiac Enzymes: No results for input(s): CKTOTAL, CKMB, CKMBINDEX, TROPONINI in the last 168 hours.  BNP: Invalid input(s): POCBNP  CBG:  Recent Labs Lab 12/25/16 1103 12/25/16 1627 12/25/16 2108 12/26/16 0706 12/26/16 1143  GLUCAP 138* 193* 204* 200* 202*    Microbiology: Results for orders placed or performed during the hospital encounter of 12/22/16  Urine Culture     Status: Abnormal   Collection Time: 12/24/16 12:28 AM  Result Value Ref Range Status   Specimen Description URINE, CLEAN CATCH  Final   Special Requests NONE  Final   Culture >=100,000 COLONIES/mL KLEBSIELLA PNEUMONIAE (A)  Final   Report Status 12/26/2016 FINAL  Final   Organism ID, Bacteria KLEBSIELLA PNEUMONIAE (A)  Final      Susceptibility   Klebsiella pneumoniae - MIC*    AMPICILLIN >=32 RESISTANT Resistant     CEFAZOLIN <=4 SENSITIVE Sensitive     CEFTRIAXONE <=1 SENSITIVE Sensitive     CIPROFLOXACIN 1 SENSITIVE Sensitive     GENTAMICIN <=1  SENSITIVE Sensitive     IMIPENEM <=0.25 SENSITIVE Sensitive     NITROFURANTOIN 128 RESISTANT Resistant     TRIMETH/SULFA <=20 SENSITIVE Sensitive     AMPICILLIN/SULBACTAM 8 SENSITIVE Sensitive     PIP/TAZO 8 SENSITIVE Sensitive     Extended ESBL NEGATIVE Sensitive     * >=100,000 COLONIES/mL KLEBSIELLA PNEUMONIAE    Coagulation Studies: No results for input(s): LABPROT, INR in the last 72 hours.  Urinalysis: No results for input(s): COLORURINE, LABSPEC, PHURINE, GLUCOSEU, HGBUR, BILIRUBINUR, KETONESUR, PROTEINUR, UROBILINOGEN, NITRITE, LEUKOCYTESUR in the last 168 hours.  Invalid input(s): APPERANCEUR  Lipid Panel:     Component Value Date/Time   CHOL 105 12/23/2016 0712   CHOL 184 04/21/2015 0956   TRIG 103 12/23/2016 0712   HDL 46 12/23/2016 0712   HDL 49 04/21/2015 0956   CHOLHDL 2.3 12/23/2016 0712   VLDL 21 12/23/2016 0712   LDLCALC 38 12/23/2016 0712   LDLCALC 112 (H) 04/21/2015 0956    HgbA1C:  Lab Results  Component Value Date   HGBA1C 11.4 (H) 12/23/2016    Urine Drug Screen:     Component Value Date/Time   LABOPIA NONE DETECTED 12/20/2016 1941   COCAINSCRNUR NONE DETECTED 12/20/2016 1941   LABBENZ NONE DETECTED 12/20/2016 1941   AMPHETMU NONE DETECTED 12/20/2016 1941   THCU NONE DETECTED 12/20/2016 1941   LABBARB NONE DETECTED 12/20/2016 1941    Alcohol Level:  Recent Labs Lab 12/20/16 1955  ETH <10     Imaging: No results found.   Assessment/Plan: 51 year old female with recent diagnosis of MS.  Has received Solumedrol but continues to have many of her presenting symptoms.  Multiple lesions noted on imaging of the brain, cervical spine and lumbar spine.  Patient has not received therapy as an outpatient and has not had neurology follow up as an outpatient.  Recommendations: 1.  At discharge patient to have [facility] 2.  Patient to have outpatient follow up with Dr. Epimenio Foot at [facility] but currently does not have transportation.  Will need to  get connected to services available in the community.    Thana Farr, MD Neurology [phone] 12/26/2016, 12:34 PM

## 2016-12-26 NOTE — Social Work (Signed)
CSW left VM for Bennita Curtain to find out status of [facility]ISPDAT referral which was made by CSW Coble last week, left VM requesting return call.   Santa Genera, LCSW Lead Clinical Social Worker Phone:  [phone]

## 2016-12-26 NOTE — Progress Notes (Signed)
PHARMACY - PHYSICIAN COMMUNICATION CRITICAL VALUE ALERT - BLOOD CULTURE IDENTIFICATION (BCID)  Recent Results (from the past 240 hour(s))  Urine Culture     Status: Abnormal   Collection Time: 12/24/16 12:28 AM  Result Value Ref Range Status   Specimen Description URINE, CLEAN CATCH  Final   Special Requests NONE  Final   Culture >=100,000 COLONIES/mL KLEBSIELLA PNEUMONIAE (A)  Final   Report Status 12/26/2016 FINAL  Final   Organism ID, Bacteria KLEBSIELLA PNEUMONIAE (A)  Final      Susceptibility   Klebsiella pneumoniae - MIC*    AMPICILLIN >=32 RESISTANT Resistant     CEFAZOLIN <=4 SENSITIVE Sensitive     CEFTRIAXONE <=1 SENSITIVE Sensitive     CIPROFLOXACIN 1 SENSITIVE Sensitive     GENTAMICIN <=1 SENSITIVE Sensitive     IMIPENEM <=0.25 SENSITIVE Sensitive     NITROFURANTOIN 128 RESISTANT Resistant     TRIMETH/SULFA <=20 SENSITIVE Sensitive     AMPICILLIN/SULBACTAM 8 SENSITIVE Sensitive     PIP/TAZO 8 SENSITIVE Sensitive     Extended ESBL NEGATIVE Sensitive     * >=100,000 COLONIES/mL KLEBSIELLA PNEUMONIAE    Name of physician (or Provider) Contacted: Dr Luberta Mutter  Changes to prescribed antibiotics required: Cipro 500mg  po bid   Gerre Pebbles Daliya Parchment 12/26/2016  12:27 PM

## 2016-12-26 NOTE — Progress Notes (Signed)
[facility] MD Progress Note  12/26/2016 10:46 AM Debra [name]  MRN:  [id number]   Subjective:  Pt states that she is till feeling very depressed. She states that she still feels very hopeless because she is not sure what her future is going to look like and states, 'My future looks bleak." She states that she is still feeling suicidal because of the hopelessness. She states that she is hopeless because of the multiple medical comorbidiites and recent diagnosis of MS and inability to walk. She states that her ex-boyfriend can no longer take care of her and she can't live with him anymore. She is still having urinary retention and bowel incontinence at times. IM did see her yesterday and are making adjustments to medications. He has also consulted neurology for new onset MS. Pt states that she slept well last night.  Principal Problem: Major depressive disorder, single episode, severe without psychosis (HCC) Diagnosis:   Patient Active Problem List   Diagnosis Date Noted  . MDD (major depressive disorder), recurrent severe, without psychosis (HCC) [F33.2]   . Major depressive disorder, single episode, severe without psychosis (HCC) [F32.2] 12/22/2016  . Multiple joint pain [M25.50]   . HTN (hypertension) [I10]   . Acute pain of left knee [M25.562]   . Neurogenic bladder [N31.9]   . Acute lower UTI [N39.0]   . Labile blood pressure [R09.89]   . Acute blood loss anemia [D62]   . Leukocytosis [D72.829]   . Labile blood glucose [R73.09]   . Hypertensive crisis [I16.9]   . Hypoglycemia [E16.2]   . Poorly controlled type 2 diabetes mellitus with peripheral neuropathy (HCC) [E11.42, E11.65]   . Uncontrolled hypertension [I10]   . Sleep disturbance [G47.9]   . Multiple sclerosis (HCC) [G35] 11/22/2016  . Thoracic root lesion [G54.3] 11/22/2016  . MS (multiple sclerosis) (HCC) [G35]   . Neuropathic pain [M79.2]   . Uncontrolled type 2 diabetes mellitus with peripheral neuropathy (HCC) [E11.42,  E11.65]   . Urinary retention [R33.9]   . Medically noncompliant [Z91.19]   . Leg weakness, bilateral [R29.898] 11/20/2016  . Diabetic ketoacidosis without coma associated with type 1 diabetes mellitus (HCC) [E10.10]   . Leg pain [M79.606]   . History of CVA (cerebrovascular accident) [Z86.73]   . 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 Benign essential HTN [I10]   . Hypertensive urgency [I16.0]   . AKI (acute kidney injury) (HCC) [N17.9]   . Fibromyalgia [M79.7] 11/07/2016  . Back pain [M54.9] 10/31/2016  . Stable angina pectoris (HCC) [I20.8]   . Vitamin D deficiency [E55.9] 08/31/2014  . 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 Hyperlipidemia [E78.5] 10/06/2013  . Depression [F32.9] 10/16/2012  . Bilateral knee pain [M25.561, M25.562] 02/16/2012  . Chronic diarrhea [K52.9] 10/19/2010  . Peripheral neuropathy [G62.9] 08/19/2010  . Type 1 diabetes mellitus (HCC) [E10.9] 07/06/2010  . Hypertension [I10] 07/06/2010  . Asthma [J45.909] 07/06/2010  . Migraine [G43.909] 07/06/2010   Total Time spent with patient: 20 minutes  Past Psychiatric History: See H&P  Past Medical History:  Past Medical History:  Diagnosis Date  . Adenomatous colonic polyps   . Anemia    2005  . Anxiety    1990  . Arthritis   . Asthma    2000  . Cataract   . Depression 1990  . Difficult intubation    narrow airway  . Gastroesophageal Reflux Disease (GERD)   .  Heart murmur    Birth  . Hyperlipidemia    2005  . Hypertension    1998  . Internal hemorrhoids 04/24/06   on colonoscopy  . Migraine   . Neuropathy of the hands & feet   . Restless Leg Syndrome   . Right Ankle Fracture 10/06/2013  . Sleep paralysis   . Stable angina pectoris    2007: cath showing normal cors.   . Stroke 1990  . Type I Diabetes Mellitus 1988    Past Surgical History:  Procedure Laterality Date  . bilateral foot  surgery    . BREAST SURGERY Left    biopsy left breast  . CARDIAC CATHETERIZATION  2007   around 2007 or 2008  . CESAREAN SECTION    . CHOLECYSTECTOMY    . COLONOSCOPY W/ BIOPSIES AND POLYPECTOMY    . DILATION AND CURETTAGE OF UTERUS  12/29/2010   Surgeon: Tereso Newcomer, MD;  Location: [facility];  Service: Gynecology  . ENDOMETRIAL ABLATION W/ NOVASURE N/A 12/2009  . EYE SURGERY    . HYSTEROSCOPY  12/29/2010   Procedure: HYSTEROSCOPY WITH HYDROTHERMAL ABLATION;  Surgeon: Tereso Newcomer, MD;  Location: [facility];  Service: Gynecology;;  . IUD REMOVAL  12/29/2010   Procedure: INTRAUTERINE DEVICE (IUD) REMOVAL;  Surgeon: Tereso Newcomer, MD;  Location: [facility];  Service: Gynecology;  Laterality: N/A;  . LAPAROSCOPIC TUBAL LIGATION  12/29/2010   Procedure: LAPAROSCOPIC TUBAL LIGATION;  Surgeon: Tereso Newcomer, MD;  Location: [facility];  Service: Gynecology;  Laterality: Bilateral;  . ORIF ANKLE FRACTURE Right 10/09/2013   Procedure: Open reduction internal fixation right ankle;  Surgeon: Mable Paris, MD;  Location: [facility];  Service: Orthopedics;  Laterality: Right;  Open reduction internal fixation right ankle  . TRIGGER FINGER RELEASE     x 3   Family History:  Family History  Problem Relation Age of Onset  . Hypertension Mother   . Kidney disease Mother   . Hypertension Father   . Breast cancer Maternal Grandmother   . Prostate cancer Maternal Grandfather   . Ovarian cancer Paternal Grandmother   . Prostate cancer Paternal Grandfather   . Colon cancer Maternal Uncle        Family history of malignant neoplasm of gastrointestinal tract   Family Psychiatric  History: See H&P Social History:  History  Alcohol Use No     History  Drug Use No    Comment: drug addict    Social History   Social History  . Marital status: Divorced    Spouse name: N/A  . Number of children: 2  . Years of education: 14   Occupational History  . Unemployed    Social History Main Topics   . Smoking status: Never Smoker  . Smokeless tobacco: Never Used  . Alcohol use No  . Drug use: No     Comment: drug addict  . Sexual activity: Yes    Birth control/ protection: Other-see comments     Comment: pt states she had ablation in 2011   Other Topics Concern  . None   Social History Narrative   Lost medicaid about 2009 ish when her youngest child turned 59. Has adult children. Lives with boyfriend who financially supports her. Has attempted to get disability but has been turned down.      2-3 caffeine drinks daily     Sleep: Good  Appetite:  Fair  Current Medications: Current Facility-Administered Medications  Medication Dose Route  Frequency Provider Last Rate Last Dose  . acetaminophen (TYLENOL) tablet 650 mg  650 mg Oral Q6H PRN Pucilowska, Jolanta B, MD   650 mg at 12/24/16 2032  . albuterol (PROVENTIL HFA;VENTOLIN HFA) 108 (90 Base) MCG/ACT inhaler 2 puff  2 puff Inhalation Q6H PRN Pucilowska, Jolanta B, MD      . alum & mag hydroxide-simeth (MAALOX/MYLANTA) 200-200-20 MG/5ML suspension 30 mL  30 mL Oral Q4H PRN Pucilowska, Jolanta B, MD      . amLODipine (NORVASC) tablet 10 mg  10 mg Oral Daily Pucilowska, Jolanta B, MD   10 mg at 12/26/16 0800  . aspirin EC tablet 81 mg  81 mg Oral Daily Pucilowska, Jolanta B, MD   81 mg at 12/26/16 0801  . atorvastatin (LIPITOR) tablet 40 mg  40 mg Oral q1800 Pucilowska, Jolanta B, MD   40 mg at 12/25/16 1704  . bethanechol (URECHOLINE) tablet 50 mg  50 mg Oral TID Edin Kon, Ileene Hutchinson, MD      . diphenoxylate-atropine (LOMOTIL) 2.5-0.025 MG per tablet 1 tablet  1 tablet Oral QID PRN Lachanda Buczek, Ileene Hutchinson, MD      . DULoxetine (CYMBALTA) DR capsule 30 mg  30 mg Oral Daily Beverly Sessions, MD   30 mg at 12/26/16 0800  . etodolac (LODINE) capsule 200 mg  200 mg Oral BID Pucilowska, Jolanta B, MD   200 mg at 12/26/16 0800  . gabapentin (NEURONTIN) tablet 600 mg  600 mg Oral TID Pucilowska, Jolanta B, MD   600 mg at 12/26/16 0800  .  hydrochlorothiazide (HYDRODIURIL) tablet 25 mg  25 mg Oral Daily Alford Highland, MD   25 mg at 12/26/16 0800  . insulin aspart (novoLOG) injection 0-5 Units  0-5 Units Subcutaneous QHS Beverly Sessions, MD   2 Units at 12/25/16 2144  . insulin aspart (novoLOG) injection 0-9 Units  0-9 Units Subcutaneous TID WC Beverly Sessions, MD   2 Units at 12/26/16 0754  . insulin aspart (novoLOG) injection 8 Units  8 Units Subcutaneous TID WC Jovanny Stephanie, Ileene Hutchinson, MD   8 Units at 12/26/16 0755  . insulin glargine (LANTUS) injection 20 Units  20 Units Subcutaneous BID Kimberley Dastrup R, MD      . lisinopril (PRINIVIL,ZESTRIL) tablet 20 mg  20 mg Oral BID Alford Highland, MD   20 mg at 12/26/16 0800  . magnesium hydroxide (MILK OF MAGNESIA) suspension 30 mL  30 mL Oral Daily PRN Pucilowska, Jolanta B, MD      . metoprolol succinate (TOPROL-XL) 24 hr tablet 100 mg  100 mg Oral Daily Pucilowska, Jolanta B, MD   100 mg at 12/26/16 0800  . nitroGLYCERIN (NITROSTAT) SL tablet 0.4 mg  0.4 mg Sublingual Q5 min PRN Pucilowska, Jolanta B, MD      . pantoprazole (PROTONIX) EC tablet 40 mg  40 mg Oral Daily Pucilowska, Jolanta B, MD   40 mg at 12/26/16 0800  . tamsulosin (FLOMAX) capsule 0.8 mg  0.8 mg Oral QPC supper Kynlei Piontek, Ileene Hutchinson, MD      . traZODone (DESYREL) tablet 100 mg  100 mg Oral QHS PRN Pucilowska, Jolanta B, MD   100 mg at 12/24/16 2133    Lab Results:  Results for orders placed or performed during the hospital encounter of 12/22/16 (from the past 48 hour(s))  Glucose, capillary     Status: Abnormal   Collection Time: 12/24/16 11:56 AM  Result Value Ref Range   Glucose-Capillary 242 (H) 65 - 99 mg/dL  Glucose,  capillary     Status: Abnormal   Collection Time: 12/24/16  4:24 PM  Result Value Ref Range   Glucose-Capillary 262 (H) 65 - 99 mg/dL  Glucose, capillary     Status: Abnormal   Collection Time: 12/24/16  8:57 PM  Result Value Ref Range   Glucose-Capillary 267 (H) 65 - 99 mg/dL   Comment 1 Notify RN    Glucose, capillary     Status: Abnormal   Collection Time: 12/25/16  6:53 AM  Result Value Ref Range   Glucose-Capillary 356 (H) 65 - 99 mg/dL  Glucose, capillary     Status: Abnormal   Collection Time: 12/25/16 11:03 AM  Result Value Ref Range   Glucose-Capillary 138 (H) 65 - 99 mg/dL  Glucose, capillary     Status: Abnormal   Collection Time: 12/25/16  4:27 PM  Result Value Ref Range   Glucose-Capillary 193 (H) 65 - 99 mg/dL  Glucose, capillary     Status: Abnormal   Collection Time: 12/25/16  9:08 PM  Result Value Ref Range   Glucose-Capillary 204 (H) 65 - 99 mg/dL  Glucose, capillary     Status: Abnormal   Collection Time: 12/26/16  7:06 AM  Result Value Ref Range   Glucose-Capillary 200 (H) 65 - 99 mg/dL   Comment 1 Notify RN    Comment 2 Document in Chart     Blood Alcohol level:  Lab Results  Component Value Date   ETH <10 12/20/2016   ETH  12/09/2006    <5        LOWEST DETECTABLE LIMIT FOR SERUM ALCOHOL IS 11 mg/dL FOR MEDICAL PURPOSES ONLY    Metabolic Disorder Labs: Lab Results  Component Value Date   HGBA1C 11.4 (H) 12/23/2016   MPG 280.48 12/23/2016   No results found for: PROLACTIN Lab Results  Component Value Date   CHOL 105 12/23/2016   TRIG 103 12/23/2016   HDL 46 12/23/2016   CHOLHDL 2.3 12/23/2016   VLDL 21 12/23/2016   LDLCALC 38 12/23/2016   LDLCALC 112 (H) 04/21/2015    Physical Findings: AIMS: Facial and Oral Movements Muscles of Facial Expression: None, normal Lips and Perioral Area: None, normal Jaw: None, normal Tongue: None, normal,Extremity Movements Upper (arms, wrists, hands, fingers): None, normal Lower (legs, knees, ankles, toes): None, normal,  , Overall Severity Severity of abnormal movements (highest score from questions above): None, normal Incapacitation due to abnormal movements: None, normal Patient's awareness of abnormal movements (rate only patient's report): No Awareness, Dental Status Current problems with  teeth and/or dentures?: No Does patient usually wear dentures?: No  CIWA:    COWS:     Musculoskeletal: Strength & Muscle Tone: BLE weakness Gait & Station: Weakness in BLE, in wheelchair today Patient leans: N/A  Psychiatric Specialty Exam: Physical Exam  Review of Systems  Gastrointestinal: Positive for diarrhea.  Positive for urinary retention  Blood pressure (!) 198/68, pulse 68, temperature 98.9 F (37.2 C), temperature source Oral, resp. rate 16, height 5\' 4"  (1.626 m), weight 68 kg (150 lb), SpO2 99 %.Body mass index is 25.75 kg/m.  General Appearance: Casual  Eye Contact:  Fair  Speech:  Slow  Volume:  Decreased  Mood:  Depressed  Affect:  Congruent  Thought Process:  Goal Directed  Orientation:  Full (Time, Place, and Person)  Thought Content:  Negative  Suicidal Thoughts:  Yes.  with intent/plan  Homicidal Thoughts:  No  Memory:  Immediate;   Fair  Judgement:  Fair  Insight:  Fair  Psychomotor Activity:  Normal  Concentration:  Concentration: Fair  Recall:  FiservFair  Fund of Knowledge:  Fair  Language:  Fair  Akathisia:  No      Assets:  Communication Skills Desire for Improvement  ADL's:  Impaired  Cognition:  WNL  Sleep:  Number of Hours: 6.15     Treatment Plan Summary: 51 yo female with history of depression admitted due SI with a plan to overdose. She has multiple psychosocial stressors and multiple medical comorbidiites including recent diagnosis of MS. She is feeling very hopeless and suicidal.  Plan:  MDD -Continue Cymbalta 30 mg daily  HTN -IM on board and adjusting medications. HCTZ was increased to 25 mg daily, Lisinopril was increased to 20 mg BID -Continue Metoprolol 100 mg daily  DM Type I -Diabetes care coordinator on board. Recommended increasing Lantus to 20 units qhs. Continue Aspart 8 units TID  Multiple Sclerosis -Pt on 1:1 for fall risk -PT on board -Neurology consulted  Neurogenic bladder -Flomax 0.8 mg qhs, urecholine  TID  Diarrhea -Restart Lomotil prn  Social/Dispo -Pt is newly homeless as her boyfriend can no longer take care of her. She has 2 adult children that she identifies as her support system. Pt is in process of applying for disability. APS is involved with dispo.  Haskell RilingHolly R Jailani Hogans, MD 12/26/2016, 10:46 AM

## 2016-12-26 NOTE — Progress Notes (Signed)
Patient resting in bed with eyes closed. Sitter at bedside, no complaints of voiced.

## 2016-12-26 NOTE — Progress Notes (Signed)
Patient in room with sitter at bedside, no distress noted.

## 2016-12-26 NOTE — Progress Notes (Signed)
Patient in medication  room with nurse. Patient endorses passive SI, no plan and verbally contract for safety. Remains on 1:1 for safety reasons.

## 2016-12-26 NOTE — Plan of Care (Signed)
Problem: Education: Goal: Utilization of techniques to improve thought processes will improve Outcome: Not Progressing Patient continues to endorse suicidal thoughts. Goal: Knowledge of the prescribed therapeutic regimen will improve Outcome: Progressing Patient is knowledgeable of her therapeutic regimen.  Problem: Coping: Goal: Ability to verbalize feelings will improve Outcome: Progressing Patient shows the ability to verbalize her feelings to staff.  Problem: Safety: Goal: Ability to disclose and discuss suicidal ideas will improve Outcome: Progressing Patient endorses passive SI.  Problem: Safety: Goal: Ability to remain free from injury will improve Outcome: Progressing Patient remains free from injury.

## 2016-12-26 NOTE — Progress Notes (Addendum)
CSW spoke with pt about her options for housing once she is ready for discharge.  After her last discharge from the rehab center, she went with her daughter.  The home health nurse came to visit and asked pt about her mental health.  When pt admitted to feeling depressed and having suicidal thoughts, the nurse had the police come and transport her to [facility].  The landlord witnessed the police come and take her and pt's daughter was very afraid she could be asked to leave her apartment.  Pt also reports that she had to sleep sitting up on a couch at her daughter's home.  There is no available bed, so this was not a great situation anyways.  Pt son lives in public housing in [place] and has already asked if pt could stay there and been told she cannot.  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  ONe day at work, she was unable to walk, which led to them diagnosing the MS.  Pt was also cleaning a bank for extra money.  She is not physically able to stand and walk around enough currently to do any kind of custodial work.  She has worked in Engineering geologist in the past as well but standing would again be an issue.  She has no income right now.  CSW discussed with pt that we will need to figure out the best plan we can quickly so that when she is cleared for discharge there is something in place.  Pt agreed to call her children to see what other assistance they could offer.  CSW left message with Arnetha Courser, [facility] APS.  [phone]. Garner Nash, MSW, LCSW Clinical Social Worker 12/26/2016 11:08 AM

## 2016-12-26 NOTE — Progress Notes (Signed)
Medical doctor in to assess patient, patient sitting on the side of the bed with sitter at bedside.

## 2016-12-26 NOTE — Progress Notes (Signed)
Patient sitting up on the side of her bed, laughing and communicating with 1:1 sitter. 100% of dinner eaten compliant with medication. Ambulates throughout the unit via wheelchair d/t generalized weakness. States "I don't as depressed as I did earlier today." Will continue to monitor.

## 2016-12-26 NOTE — Progress Notes (Signed)
Patient lying in bed with curtains drawn in room. Lying in bed with eyes closed. 1:1 remains in place.

## 2016-12-26 NOTE — Progress Notes (Signed)
Patient in bed lying with eyes closed, no distress noted. Sitter remains at bedside. Blood sugar will be checked within this hour. Insulin coverage administered based upon results.

## 2016-12-27 ENCOUNTER — Encounter: Payer: Self-pay | Admitting: Physical Medicine & Rehabilitation

## 2016-12-27 ENCOUNTER — Ambulatory Visit: Payer: Self-pay

## 2016-12-27 DIAGNOSIS — F341 Dysthymic disorder: Secondary | ICD-10-CM

## 2016-12-27 LAB — GLUCOSE, CAPILLARY
GLUCOSE-CAPILLARY: 223 mg/dL — AB (ref 65–99)
GLUCOSE-CAPILLARY: 170 mg/dL — AB (ref 65–99)
Glucose-Capillary: 278 mg/dL — ABNORMAL HIGH (ref 65–99)
Glucose-Capillary: 262 mg/dL — ABNORMAL HIGH (ref 65–99)

## 2016-12-27 MED ORDER — INSULIN ASPART 100 UNIT/ML ~~LOC~~ SOLN: 2.0000 [IU] | Freq: Every day | SUBCUTANEOUS | Status: DC

## 2016-12-27 MED ORDER — INFLUENZA VAC SPLIT QUAD 0.5 ML IM SUSY: 0.5000 mL | PREFILLED_SYRINGE | INTRAMUSCULAR | Status: DC

## 2016-12-27 MED ORDER — INSULIN ASPART 100 UNIT/ML ~~LOC~~ SOLN
2.0000 [IU] | SUBCUTANEOUS | Status: DC
  Administered 2016-12-27 – 2016-12-29 (×3): 2 [IU] via SUBCUTANEOUS
  Filled 2016-12-27 (×3): qty 1

## 2016-12-27 MED ORDER — DULOXETINE HCL 20 MG PO CPEP
40.0000 mg | ORAL_CAPSULE | Freq: Every day | ORAL | Status: DC
  Administered 2016-12-28 – 2017-01-01 (×5): 40 mg via ORAL
  Filled 2016-12-27 (×5): qty 2

## 2016-12-27 NOTE — Progress Notes (Signed)
Inpatient Diabetes Program Recommendations  AACE/ADA: New Consensus Statement on Inpatient Glycemic Control (2015)  Target Ranges:  Prepandial:   less than 140 mg/dL      Peak postprandial:   less than 180 mg/dL (1-2 hours)      Critically ill patients:  140 - 180 mg/dL   Lab Results  Component Value Date   GLUCAP 278 (H) 12/27/2016   HGBA1C 11.4 (H) 12/23/2016    Review of Glycemic Control  Results for Debra, [name] (MRN [id number]) as of 12/27/2016 07:23  Ref. Range 12/26/2016 07:06 12/26/2016 11:43 12/26/2016 16:29 12/26/2016 21:11 12/27/2016 06:58  Glucose-Capillary Latest Ref Range: 65 - 99 mg/dL 762 (H) 831 (H) 517 (H) 143 (H) 278 (H)    Diabetes history:DM1 Outpatient Diabetes medications: Lantus 17 units BID, Novolog 0-10 units TID with meals Current orders for Inpatient glycemic control: Lantus 23 units BID, Novolog 8 units TID with meals, Novolog 0-9 units TID with meals, Novolog 0-5 units QHS  Inpatient Diabetes Program Recommendations:   Snacks added to meal plan yesterday as patient complaining she was hungry (per Dr. Johnella Moloney on 12/26/16).  Please consider adding Novolog  2 units qhs for hs snack of 15grams of carbohydrate (hold if patient does not eat an hs snack)   Order can be written as insulin aspart q24 hours at 10:30pm for 15gram carbohydrate snack (hold if patient does not eat an hs snack) continue Novolog 0-5 units correction as it is to be given if CBG is high (>201mg /dl) even if the patient does not eat the snack.   Debra Racer, RN, BA, MHA, CDE Diabetes Coordinator Inpatient Glycemic Control Team [phone] (Team Pager) [phone] [facility] Office) 12/27/2016 7:26 AM

## 2016-12-27 NOTE — Progress Notes (Addendum)
[facility] MD Progress Note  12/27/2016 12:14 PM Debra [name]  MRN:  [id number]   Subjective:  Pt states that she is doing "so-so." She states that she is still feeling very depressed and hopeless. She states that she is still having thoughts of suicide with plan to overdose. She states that she is trying to keep positive however, she cannot see the future but is happy that people are working on things for her. She reports long history of suicidal thoughts and states that she cannot remember a time when these thoughts were not there. Attempted to process through this and identify things that make her happy. She states that her 49 yo granddaughter makes her happy. She states that she does not remember a time that she was happy.  She states that she is still having a lot of weakness when walking. She still has diarrhea at times and sometimes is not able to make it to the bathroom on time. Today, this has been slightly better. She did work with PT today. She is eating well and tolerating medications.  I spoke with her daughter today, Abe People. She states that there is an open APS case for her mother and this worker has been trying to locate her for 3 days. She states that this APS worker may be able to help with insurance. Abe People states that the is not on speaking terms with her mother at this time since the way that she went about things. She states that her mother told her that she was going to overdose on insulin and when the cops came her landlord found out that she had another person living there. She may be kicked out of her living facility due to this and is upset about this.   Principal Problem: Major depressive disorder, recurrent episode, severe ([place]) Diagnosis:   Patient Active Problem List   Diagnosis Date Noted  . Dysthymia [F34.1] 12/27/2016  . Major depressive disorder, recurrent episode, severe ([place]) [F33.2]   . Multiple joint pain [M25.50]   . HTN (hypertension) [I10]   . Acute pain of  left knee [M25.562]   . Neurogenic bladder [N31.9]   . Acute lower UTI [N39.0]   . Labile blood pressure [R09.89]   . Acute blood loss anemia [D62]   . Leukocytosis [D72.829]   . Labile blood glucose [R73.09]   . Hypertensive crisis [I16.9]   . Hypoglycemia [E16.2]   . Poorly controlled type 2 diabetes mellitus with peripheral neuropathy (HCC) [E11.42, E11.65]   . Uncontrolled hypertension [I10]   . Sleep disturbance [G47.9]   . Multiple sclerosis ([place]) [G35] 11/22/2016  . Thoracic root lesion [G54.3] 11/22/2016  . MS (multiple sclerosis) ([place]) [G35]   . Neuropathic pain [M79.2]   . Uncontrolled type 2 diabetes mellitus with peripheral neuropathy (HCC) [E11.42, E11.65]   . Urinary retention [R33.9]   . Medically noncompliant [Z91.19]   . Leg weakness, bilateral [R29.898] 11/20/2016  . Diabetic ketoacidosis without coma associated with type 1 diabetes mellitus ([place]) [E10.10]   . Leg pain [M79.606]   . History of CVA (cerebrovascular accident) [Z86.73]   . 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 Benign essential HTN [I10]   . Hypertensive urgency [I16.0]   . AKI (acute kidney injury) ([place]) [N17.9]   . Fibromyalgia [M79.7] 11/07/2016  . Back pain [M54.9] 10/31/2016  . Stable angina pectoris ([place]) [I20.8]   . Vitamin D deficiency [E55.9] 08/31/2014  . Left Eye Macular  Edema secondary to Diabetes Mellitus [E13.311] 07/24/2014  . Hyperlipidemia [E78.5] 10/06/2013  . Depression [F32.9] 10/16/2012  . Bilateral knee pain [M25.561, M25.562] 02/16/2012  . Chronic diarrhea [K52.9] 10/19/2010  . Peripheral neuropathy [G62.9] 08/19/2010  . Type 1 diabetes mellitus ([place]) [E10.9] 07/06/2010  . Hypertension [I10] 07/06/2010  . Asthma [J45.909] 07/06/2010  . Migraine [G43.909] 07/06/2010   Total Time spent with patient: 20 minutes  Past Psychiatric History: See H&P  Past Medical History:  Past Medical History:  Diagnosis Date   . Adenomatous colonic polyps   . Anemia    2005  . Anxiety    1990  . Arthritis   . Asthma    2000  . Cataract   . Depression 1990  . Difficult intubation    narrow airway  . Gastroesophageal Reflux Disease (GERD)   . Heart murmur    Birth  . Hyperlipidemia    2005  . Hypertension    1998  . Internal hemorrhoids 04/24/06   on colonoscopy  . Migraine   . Neuropathy of the hands & feet   . Restless Leg Syndrome   . Right Ankle Fracture 10/06/2013  . Sleep paralysis   . Stable angina pectoris    2007: cath showing normal cors.   . Stroke 1990  . Type I Diabetes Mellitus 1988    Past Surgical History:  Procedure Laterality Date  . bilateral foot surgery    . BREAST SURGERY Left    biopsy left breast  . CARDIAC CATHETERIZATION  2007   around 2007 or 2008  . CESAREAN SECTION    . CHOLECYSTECTOMY    . COLONOSCOPY W/ BIOPSIES AND POLYPECTOMY    . DILATION AND CURETTAGE OF UTERUS  12/29/2010   Surgeon: Osborne Oman, MD;  Location: [facility];  Service: Gynecology  . ENDOMETRIAL ABLATION W/ NOVASURE N/A 12/2009  . EYE SURGERY    . HYSTEROSCOPY  12/29/2010   Procedure: HYSTEROSCOPY WITH HYDROTHERMAL ABLATION;  Surgeon: Osborne Oman, MD;  Location: [facility];  Service: Gynecology;;  . IUD REMOVAL  12/29/2010   Procedure: INTRAUTERINE DEVICE (IUD) REMOVAL;  Surgeon: Osborne Oman, MD;  Location: [facility];  Service: Gynecology;  Laterality: N/A;  . LAPAROSCOPIC TUBAL LIGATION  12/29/2010   Procedure: LAPAROSCOPIC TUBAL LIGATION;  Surgeon: Osborne Oman, MD;  Location: [facility];  Service: Gynecology;  Laterality: Bilateral;  . ORIF ANKLE FRACTURE Right 10/09/2013   Procedure: Open reduction internal fixation right ankle;  Surgeon: Nita Sells, MD;  Location: [facility];  Service: Orthopedics;  Laterality: Right;  Open reduction internal fixation right ankle  . TRIGGER FINGER RELEASE     x 3   Family History:  Family History  Problem Relation Age of Onset  .  Hypertension Mother   . Kidney disease Mother   . Hypertension Father   . Breast cancer Maternal Grandmother   . Prostate cancer Maternal Grandfather   . Ovarian cancer Paternal Grandmother   . Prostate cancer Paternal Grandfather   . Colon cancer Maternal Uncle        Family history of malignant neoplasm of gastrointestinal tract   Family Psychiatric  History: See H&P Social History:  History  Alcohol Use No     History  Drug Use No    Comment: drug addict     Sleep: Good  Appetite:  Good  Current Medications: Current Facility-Administered Medications  Medication Dose Route Frequency Provider Last Rate Last Dose  . acetaminophen (TYLENOL) tablet  650 mg  650 mg Oral Q6H PRN Pucilowska, Jolanta B, MD   650 mg at 12/26/16 2140  . albuterol (PROVENTIL HFA;VENTOLIN HFA) 108 (90 Base) MCG/ACT inhaler 2 puff  2 puff Inhalation Q6H PRN Pucilowska, Jolanta B, MD      . alum & mag hydroxide-simeth (MAALOX/MYLANTA) 200-200-20 MG/5ML suspension 30 mL  30 mL Oral Q4H PRN Pucilowska, Jolanta B, MD      . amLODipine (NORVASC) tablet 10 mg  10 mg Oral Daily Pucilowska, Jolanta B, MD   10 mg at 12/27/16 0801  . aspirin EC tablet 81 mg  81 mg Oral Daily Pucilowska, Jolanta B, MD   81 mg at 12/27/16 0802  . atorvastatin (LIPITOR) tablet 40 mg  40 mg Oral q1800 Pucilowska, Jolanta B, MD   40 mg at 12/26/16 1701  . bethanechol (URECHOLINE) tablet 50 mg  50 mg Oral TID Marylin Crosby, MD   50 mg at 12/27/16 1213  . ciprofloxacin (CIPRO) tablet 500 mg  500 mg Oral BID Marylin Crosby, MD   500 mg at 12/27/16 0801  . diphenoxylate-atropine (LOMOTIL) 2.5-0.025 MG per tablet 1 tablet  1 tablet Oral QID PRN Fallyn Munnerlyn, Tyson Babinski, MD      . Derrill Memo ON 12/28/2016] DULoxetine (CYMBALTA) DR capsule 40 mg  40 mg Oral Daily Zayda Angell R, MD      . etodolac (LODINE) capsule 200 mg  200 mg Oral BID Pucilowska, Jolanta B, MD   200 mg at 12/27/16 0801  . gabapentin (NEURONTIN) tablet 600 mg  600 mg Oral TID Pucilowska,  Jolanta B, MD   600 mg at 12/27/16 1214  . hydrALAZINE (APRESOLINE) tablet 25 mg  25 mg Oral Q8H Epifanio Lesches, MD   25 mg at 12/27/16 0546  . hydrochlorothiazide (HYDRODIURIL) tablet 25 mg  25 mg Oral Daily Loletha Grayer, MD   25 mg at 12/27/16 0802  . [START ON 12/28/2016] Influenza vac split quadrivalent PF (FLUARIX) injection 0.5 mL  0.5 mL Intramuscular Tomorrow-1000 Munachimso Palin R, MD      . insulin aspart (novoLOG) injection 0-5 Units  0-5 Units Subcutaneous QHS Marylin Crosby, MD   2 Units at 12/25/16 2144  . insulin aspart (novoLOG) injection 0-9 Units  0-9 Units Subcutaneous TID WC Lenward Chancellor, MD   5 Units at 12/27/16 1214  . insulin aspart (novoLOG) injection 2 Units  2 Units Subcutaneous Daily Lovelle Deitrick R, MD      . insulin aspart (novoLOG) injection 8 Units  8 Units Subcutaneous TID WC Marylin Crosby, MD   8 Units at 12/27/16 [phone]  . insulin glargine (LANTUS) injection 23 Units  23 Units Subcutaneous BID Epifanio Lesches, MD   23 Units at 12/27/16 0802  . lisinopril (PRINIVIL,ZESTRIL) tablet 20 mg  20 mg Oral BID Loletha Grayer, MD   20 mg at 12/27/16 0802  . magnesium hydroxide (MILK OF MAGNESIA) suspension 30 mL  30 mL Oral Daily PRN Pucilowska, Jolanta B, MD      . metoprolol succinate (TOPROL-XL) 24 hr tablet 100 mg  100 mg Oral Daily Pucilowska, Jolanta B, MD   100 mg at 12/27/16 0801  . nitroGLYCERIN (NITROSTAT) SL tablet 0.4 mg  0.4 mg Sublingual Q5 min PRN Pucilowska, Jolanta B, MD      . pantoprazole (PROTONIX) EC tablet 40 mg  40 mg Oral Daily Pucilowska, Jolanta B, MD   40 mg at 12/27/16 0801  . tamsulosin (FLOMAX) capsule 0.8 mg  0.8 mg Oral  QPC supper Drevin Ortner, Tyson Babinski, MD   0.8 mg at 12/26/16 1701  . traZODone (DESYREL) tablet 50 mg  50 mg Oral QHS PRN Quention Mcneill, Tyson Babinski, MD        Lab Results:  Results for orders placed or performed during the hospital encounter of 12/22/16 (from the past 48 hour(s))  Glucose, capillary     Status: Abnormal    Collection Time: 12/25/16  4:27 PM  Result Value Ref Range   Glucose-Capillary 193 (H) 65 - 99 mg/dL  Glucose, capillary     Status: Abnormal   Collection Time: 12/25/16  9:08 PM  Result Value Ref Range   Glucose-Capillary 204 (H) 65 - 99 mg/dL  Glucose, capillary     Status: Abnormal   Collection Time: 12/26/16  7:06 AM  Result Value Ref Range   Glucose-Capillary 200 (H) 65 - 99 mg/dL   Comment 1 Notify RN    Comment 2 Document in Chart   Basic metabolic panel     Status: Abnormal   Collection Time: 12/26/16  7:11 AM  Result Value Ref Range   Sodium 137 135 - 145 mmol/L   Potassium 4.7 3.5 - 5.1 mmol/L   Chloride 103 101 - 111 mmol/L   CO2 27 22 - 32 mmol/L   Glucose, Bld 194 (H) 65 - 99 mg/dL   BUN 23 (H) 6 - 20 mg/dL   Creatinine, Ser 1.52 (H) 0.44 - 1.00 mg/dL   Calcium 9.0 8.9 - 10.3 mg/dL   GFR calc non Af Amer 39 (L) >60 mL/min   GFR calc Af Amer 45 (L) >60 mL/min    Comment: (NOTE) The eGFR has been calculated using the CKD EPI equation. This calculation has not been validated in all clinical situations. eGFR's persistently <60 mL/min signify possible Chronic Kidney Disease.    Anion gap 7 5 - 15  CK     Status: None   Collection Time: 12/26/16  7:11 AM  Result Value Ref Range   Total CK 121 38 - 234 U/L  Glucose, capillary     Status: Abnormal   Collection Time: 12/26/16 11:43 AM  Result Value Ref Range   Glucose-Capillary 202 (H) 65 - 99 mg/dL  Glucose, capillary     Status: Abnormal   Collection Time: 12/26/16  4:29 PM  Result Value Ref Range   Glucose-Capillary 188 (H) 65 - 99 mg/dL   Comment 1 Notify RN   Glucose, capillary     Status: Abnormal   Collection Time: 12/26/16  9:11 PM  Result Value Ref Range   Glucose-Capillary 143 (H) 65 - 99 mg/dL   Comment 1 Notify RN    Comment 2 Document in Chart   Glucose, capillary     Status: Abnormal   Collection Time: 12/27/16  6:58 AM  Result Value Ref Range   Glucose-Capillary 278 (H) 65 - 99 mg/dL   Glucose, capillary     Status: Abnormal   Collection Time: 12/27/16 11:44 AM  Result Value Ref Range   Glucose-Capillary 262 (H) 65 - 99 mg/dL   Comment 1 Notify RN     Blood Alcohol level:  Lab Results  Component Value Date   ETH <10 12/20/2016   ETH  12/09/2006    <5        LOWEST DETECTABLE LIMIT FOR SERUM ALCOHOL IS 11 mg/dL FOR MEDICAL PURPOSES ONLY    Metabolic Disorder Labs: Lab Results  Component Value Date   HGBA1C 11.4 (H) 12/23/2016  MPG 280.48 12/23/2016   No results found for: PROLACTIN Lab Results  Component Value Date   CHOL 105 12/23/2016   TRIG 103 12/23/2016   HDL 46 12/23/2016   CHOLHDL 2.3 12/23/2016   VLDL 21 12/23/2016   LDLCALC 38 12/23/2016   LDLCALC 112 (H) 04/21/2015    Physical Findings: AIMS: Facial and Oral Movements Muscles of Facial Expression: None, normal Lips and Perioral Area: None, normal Jaw: None, normal Tongue: None, normal,Extremity Movements Upper (arms, wrists, hands, fingers): None, normal Lower (legs, knees, ankles, toes): None, normal,  , Overall Severity Severity of abnormal movements (highest score from questions above): None, normal Incapacitation due to abnormal movements: None, normal Patient's awareness of abnormal movements (rate only patient's report): No Awareness, Dental Status Current problems with teeth and/or dentures?: No Does patient usually wear dentures?: No  CIWA:    COWS:     Musculoskeletal: Strength & Muscle Tone: Weakness Gait & Station: unsteady Patient leans: N/A  Psychiatric Specialty Exam: Physical Exam  Nursing note and vitals reviewed.   ROS  Blood pressure (!) 176/59, pulse 75, temperature 98.9 F (37.2 C), temperature source Oral, resp. rate 18, height _0  (1.626 m), weight 68 kg (150 lb), SpO2 98 %.Body mass index is 25.75 kg/m.  General Appearance: Disheveled  Eye Contact:  Fair  Speech:  Slow  Volume:  Normal  Mood:  Depressed  Affect:  Flat  Thought Process:  Goal  Directed  Orientation:  Full (Time, Place, and Person)  Thought Content:  Negative  Suicidal Thoughts:  Yes.  with intent/plan  Homicidal Thoughts:  No  Memory:  Immediate;   Fair  Judgement:  Fair  Insight:  Fair  Psychomotor Activity:  Negative  Concentration:  Concentration: Fair  Recall:  [employer] of Knowledge:  Fair  Language:  Fair        Assets:  Communication Skills Resilience  ADL's:  Impaired  Cognition:  WNL  Sleep:  Number of Hours: 6.15     Treatment Plan Summary: 51 yo female with history of depression admitted due to [place] with a plan to overdose. She has multiple psychosocial stressors and medical comorbidity. She has chronic depression and suicidal thoughts exacerbated by current stressor of homelessness and recent diagnosis of MS.   Plan:  Major Depressive disorder/dysthymic disorder -Increase Cymbalta to 40 mg daily  HTN -Internal medicine on board and adjusting medications as needed -Continue HCTZ 35 mg daily, Lisinopril 20 mg BID, Metoprolol 100 mg daily, Norvasc 10 mg daily -Hydralazine 25 mg q8 hrs added  UTI -Cipro 500 mg BID for 7 days started  Multiple sclerosis -Seen by neurology. No intervention while in hospital -PT working with patient  Dispo/Social -Pt is newly homeless as her boyfriend can no longer take care of her. She has 2 adult children that she identifies as her support system but they are unable to have her live with them with home health.  APS is involved with dispo. Pt requires 24/7 supervision (per previous documentation) and will need SNF/ALF upon discharge. She is not appropriate to discharge to shelter given many medical comorbidites and extensive weakness.   Marylin Crosby, MD 12/27/2016, 12:14 PM

## 2016-12-27 NOTE — Progress Notes (Signed)
Continues with 1:1 obs this shift. Up to the BR x1 and otherwise has been in bed. Continues to endorse anxiety and depression. Appears tired. CS was 143, no coverage was required.BP 163/43 at hs med time. No stools this shift. Obtained order for stool culture from DR P. Has been med compliant. Continues to clo hip pain. Tylenol given with HS meds. States it doesn't do much. Appears to be asleep at this time.

## 2016-12-27 NOTE — Progress Notes (Signed)
D: patient denies HI/AVH and pain. Patient states having Si without plan. Patient able to contract for safety with this Clinical research associate. HS CBG obtained.  A: Staff to monitor Q 15 mins for safety. Encouragement and support offered. Scheduled medications administered per orders. R: Patient remains safe on the unit. Patient attended group tonight. Patient visible on hte unit and interacting with peers. Patient taking administered medications.

## 2016-12-27 NOTE — Plan of Care (Signed)
Problem: Education: Goal: Knowledge of the prescribed therapeutic regimen will improve Outcome: Progressing Patient can verbalize treatment plan.  Problem: Safety: Goal: Ability to disclose and discuss suicidal ideas will improve Outcome: Progressing Patient denies si, and is willing to contract for safety.  Problem: Physical Regulation: Goal: Will remain free from infection Outcome: Progressing Patient is currently on antibiotics  Problem: Activity: Goal: Risk for activity intolerance will decrease Outcome: Progressing Patient is adhering to PT recommendations for exercises in the bed.

## 2016-12-27 NOTE — NC FL2 (Signed)
[place] MEDICAID FL2 LEVEL OF CARE SCREENING TOOL     IDENTIFICATION  Patient Name: Debra [name] Birthdate: Aug 07, 1965 Sex: female Admission Date (Current Location): 12/22/2016  [place] and [place] Number:  Scientist, clinical (histocompatibility and immunogenetics) and Address:  [facility], [address] [place], [place] [address]      Provider Number: [id number]  Attending Physician Name and Address:  Haskell Riling, MD  Relative Name and Phone Number:  Verita Schneiders, aunt, [phone]    Current Level of Care: Hospital Recommended Level of Care: Family Care Home, Assisted Living Facility Prior Approval Number:    Date Approved/Denied:   PASRR Number:    Discharge Plan: Other (Comment)    Current Diagnoses: Patient Active Problem List   Diagnosis Date Noted  . Dysthymia 12/27/2016  . Major depressive disorder, recurrent episode, severe (HCC)   . Multiple joint pain   . HTN (hypertension)   . Acute pain of left knee   . Neurogenic bladder   . Acute lower UTI   . Labile blood pressure   . Acute blood loss anemia   . Leukocytosis   . Labile blood glucose   . Hypertensive crisis   . Hypoglycemia   . Poorly controlled type 2 diabetes mellitus with peripheral neuropathy (HCC)   . Uncontrolled hypertension   . Sleep disturbance   . Multiple sclerosis (HCC) 11/22/2016  . Thoracic root lesion 11/22/2016  . MS (multiple sclerosis) (HCC)   . Neuropathic pain   . Uncontrolled type 2 diabetes mellitus with peripheral neuropathy (HCC)   . Urinary retention   . Medically noncompliant   . Leg weakness, bilateral 11/20/2016  . Diabetic ketoacidosis without coma associated with type 1 diabetes mellitus (HCC)   . Leg pain   . History of CVA (cerebrovascular accident)   . Uncontrolled type 1 diabetes mellitus with diabetic peripheral neuropathy (HCC)   . Benign essential HTN   . Hypertensive urgency   . AKI (acute kidney injury) (HCC)   . Fibromyalgia 11/07/2016  .  Back pain 10/31/2016  . Stable angina pectoris (HCC)   . Vitamin D deficiency 08/31/2014  . 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 Hyperlipidemia 10/06/2013  . Depression 10/16/2012  . Bilateral knee pain 02/16/2012  . Chronic diarrhea 10/19/2010  . Peripheral neuropathy 08/19/2010  . Type 1 diabetes mellitus (HCC) 07/06/2010  . Hypertension 07/06/2010  . Asthma 07/06/2010  . Migraine 07/06/2010    Orientation RESPIRATION BLADDER Height & Weight     Self, Time, Situation, Place  Normal Incontinent Weight: 150 lb (68 kg) Height:  5\' 4"  (162.6 cm)  BEHAVIORAL SYMPTOMS/MOOD NEUROLOGICAL BOWEL NUTRITION STATUS  Depressed BLE weakness Incontinent  Diabetic diet  AMBULATORY STATUS COMMUNICATION OF NEEDS Skin   Needs assistance with transfer. Ambulatory with walker at times. Also, uses wheelchair. High fall risk Verbally Normal                       Personal Care Assistance Level of Assistance   (some assistance with hygiene)           Functional Limitations Info   (na)          SPECIAL CARE FACTORS FREQUENCY  PT (By licensed PT) (blood sugars 4 times daily, catheterization)     PT Frequency: every other day              Contractures Contractures Info: Not present    Additional Factors Info  Allergies   Allergies Info: peicillins, pollen extract, tape           Current Medications (12/27/2016):  This is the current hospital active medication list Current Facility-Administered Medications  Medication Dose Route Frequency Provider Last Rate Last Dose  . acetaminophen (TYLENOL) tablet 650 mg  650 mg Oral Q6H PRN Pucilowska, Jolanta B, MD   650 mg at 12/26/16 2140  . albuterol (PROVENTIL HFA;VENTOLIN HFA) 108 (90 Base) MCG/ACT inhaler 2 puff  2 puff Inhalation Q6H PRN Pucilowska, Jolanta B, MD      . alum & mag hydroxide-simeth (MAALOX/MYLANTA) 200-200-20 MG/5ML suspension 30 mL  30 mL Oral Q4H PRN Pucilowska, Jolanta B, MD       . amLODipine (NORVASC) tablet 10 mg  10 mg Oral Daily Pucilowska, Jolanta B, MD   10 mg at 12/27/16 0801  . aspirin EC tablet 81 mg  81 mg Oral Daily Pucilowska, Jolanta B, MD   81 mg at 12/27/16 0802  . atorvastatin (LIPITOR) tablet 40 mg  40 mg Oral q1800 Pucilowska, Jolanta B, MD   40 mg at 12/26/16 1701  . bethanechol (URECHOLINE) tablet 50 mg  50 mg Oral TID Haskell RilingMcNew, Kendall Justo R, MD   50 mg at 12/27/16 1213  . ciprofloxacin (CIPRO) tablet 500 mg  500 mg Oral BID Haskell RilingMcNew, Ajai Harville R, MD   500 mg at 12/27/16 0801  . diphenoxylate-atropine (LOMOTIL) 2.5-0.025 MG per tablet 1 tablet  1 tablet Oral QID PRN Branda Chaudhary, Ileene HutchinsonHolly R, MD      . [name][START ON 12/28/2016] DULoxetine (CYMBALTA) DR capsule 40 mg  40 mg Oral Daily Emeree Mahler R, MD      . etodolac (LODINE) capsule 200 mg  200 mg Oral BID Pucilowska, Jolanta B, MD   200 mg at 12/27/16 0801  . gabapentin (NEURONTIN) tablet 600 mg  600 mg Oral TID Pucilowska, Jolanta B, MD   600 mg at 12/27/16 1214  . hydrALAZINE (APRESOLINE) tablet 25 mg  25 mg Oral Q8H Katha HammingKonidena, Snehalatha, MD   25 mg at 12/27/16 0546  . hydrochlorothiazide (HYDRODIURIL) tablet 25 mg  25 mg Oral Daily Alford HighlandWieting, Richard, MD   25 mg at 12/27/16 0802  . [START ON 12/28/2016] Influenza vac split quadrivalent PF (FLUARIX) injection 0.5 mL  0.5 mL Intramuscular Tomorrow-1000 Treniyah Lynn R, MD      . insulin aspart (novoLOG) injection 0-5 Units  0-5 Units Subcutaneous QHS Haskell RilingMcNew, Dyamon Sosinski R, MD   2 Units at 12/25/16 2144  . insulin aspart (novoLOG) injection 0-9 Units  0-9 Units Subcutaneous TID WC Beverly SessionsSubedi, Jagannath, MD   5 Units at 12/27/16 1214  . insulin aspart (novoLOG) injection 2 Units  2 Units Subcutaneous Daily Meryle Pugmire R, MD      . insulin aspart (novoLOG) injection 8 Units  8 Units Subcutaneous TID WC Kristalynn Coddington, Ileene HutchinsonHolly R, MD   8 Units at 12/27/16 1215  . insulin glargine (LANTUS) injection 23 Units  23 Units Subcutaneous BID Katha HammingKonidena, Snehalatha, MD   23 Units at 12/27/16 0802  . lisinopril  (PRINIVIL,ZESTRIL) tablet 20 mg  20 mg Oral BID Alford HighlandWieting, Richard, MD   20 mg at 12/27/16 0802  . magnesium hydroxide (MILK OF MAGNESIA) suspension 30 mL  30 mL Oral Daily PRN Pucilowska, Jolanta B, MD      . metoprolol succinate (TOPROL-XL) 24 hr tablet 100 mg  100 mg Oral Daily Pucilowska, Jolanta B, MD   100 mg at 12/27/16 0801  . nitroGLYCERIN (NITROSTAT) SL tablet 0.4 mg  0.4 mg Sublingual Q5 min PRN Pucilowska, Jolanta B, MD      . pantoprazole (PROTONIX) EC tablet 40 mg  40 mg Oral Daily Pucilowska, Jolanta B, MD   40 mg at 12/27/16 0801  . tamsulosin (FLOMAX) capsule 0.8 mg  0.8 mg Oral QPC supper Jovian Lembcke, Ileene Hutchinson, MD   0.8 mg at 12/26/16 1701  . traZODone (DESYREL) tablet 50 mg  50 mg Oral QHS PRN Tandre Conly, Ileene Hutchinson, MD         Discharge Medications: Please see discharge summary for a list of discharge medications.  Relevant Imaging Results:  Relevant Lab Results:   Additional Information    Lorri Frederick, LCSW

## 2016-12-27 NOTE — Progress Notes (Signed)
Recreation Therapy Notes   Date: 10.31.18  Time: 3:00pm  Location: Outside  Behavioral response: N/A  Group Type: Leisure  Participation level: N/A  Communication: Patient did not attend group.  Comments: N/A  Nicandro Perrault LRT/CTRS        Klohe Lovering 12/27/2016 4:09 PM

## 2016-12-27 NOTE — Progress Notes (Signed)
Patient is med compliant. She has a pin alarm for safety along with 15 minute checks. She is pleasant and cooperative with care. She denies any si, hi, avh, but continues to be depressed about diagnosis and current situation. Will continue to monitor.

## 2016-12-27 NOTE — Progress Notes (Signed)
Recreation Therapy Notes  Date: 10.31.18   Time: 1:00pm   Location: Craft Room   Behavioral response: None   Intervention Topic: Emotions   Discussion/Intervention: Patient did not attend group.   Clinical Observations/Feedback:  Patient did not attend group.   Jossalyn Forgione LRT/CTRS          Greco Gastelum 12/27/2016 2:19 PM
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
## 2016-12-27 NOTE — Progress Notes (Addendum)
Medical consult follow-up for hypertension, diabetes.  Blood pressure better, seen by diabetic coordinator for management of sugars, Vitals temperature 98.9, blood pressure 128/50.  Heart rate 75.    recent blood sugar 262.  Assessment and plan: Essential hypertension, uncontrolled: Better now, continue the  norvasc10 mg daily, hydralazine 50 mg every 8 hours, HCTZ 25 mg p.o. daily Toprol-XL 100 mg p.o. daily,   2.Diabetes mellitus type 1: Patient is on NovoLog 10 units 3 times a day, Lantus 24 units twice a day, NovoLog 0-9 units correction scale 3 times a day with meals, 0- 5 units daily at bedtime skin. Diabetic nurse is following and follow her recommendations. 3.  Klebsiella UTI; continue Cipro for 7 days. For history of multiple sclerosis, by neurology, patient can follow-up with her primary neurologis  We will sign off, recall if needed. Time spent reviewing the chart 20 minutes

## 2016-12-27 NOTE — Progress Notes (Signed)
complex

## 2016-12-28 LAB — GLUCOSE, CAPILLARY
GLUCOSE-CAPILLARY: 169 mg/dL — AB (ref 65–99)
GLUCOSE-CAPILLARY: 228 mg/dL — AB (ref 65–99)
GLUCOSE-CAPILLARY: 144 mg/dL — AB (ref 65–99)
Glucose-Capillary: 172 mg/dL — ABNORMAL HIGH (ref 65–99)

## 2016-12-28 MED ORDER — INSULIN ASPART 100 UNIT/ML ~~LOC~~ SOLN
10.0000 [IU] | Freq: Three times a day (TID) | SUBCUTANEOUS | Status: DC
  Administered 2016-12-28 – 2016-12-31 (×9): 10 [IU] via SUBCUTANEOUS
  Filled 2016-12-28 (×5): qty 1

## 2016-12-28 MED ORDER — INSULIN GLARGINE 100 UNIT/ML ~~LOC~~ SOLN
24.0000 [IU] | Freq: Two times a day (BID) | SUBCUTANEOUS | Status: DC
  Administered 2016-12-28 – 2016-12-31 (×7): 24 [IU] via SUBCUTANEOUS
  Filled 2016-12-28 (×8): qty 0.24

## 2016-12-28 MED ORDER — RISPERIDONE 1 MG PO TABS
0.5000 mg | ORAL_TABLET | Freq: Every day | ORAL | Status: DC
  Administered 2016-12-28 – 2016-12-31 (×4): 0.5 mg via ORAL
  Filled 2016-12-28 (×5): qty 1

## 2016-12-28 NOTE — [facility] Group Notes (Signed)
[facility] Group Notes:  (Nursing/MHT/Case Management/Adjunct)  Date:  12/28/2016  Time:  7:01 PM  Type of Therapy:  Psychoeducational Skills  Participation Level:  Did Not Attend  Debra [name] 12/28/2016, 7:01 PM

## 2016-12-28 NOTE — Progress Notes (Signed)
Recreation Therapy Notes   Date: 11.01.18   Time: 1:00pm    Location: Craft Room   Behavioral response: None   Intervention Topic: Values   Discussion/Intervention: Patient did not attend group.   Clinical Observations/Feedback:  Patient did not attend group.   Peter Daquila LRT/CTRS       Debra [name] 12/28/2016 1:49 PM

## 2016-12-28 NOTE — Progress Notes (Signed)
Recreation Therapy Notes   Date: 11.01.18  Time: 3:00pm  Location: Outside  Behavioral response: N/A  Group Type: Leisure  Participation level: N/A  Communication: Patient did not attend group.  Comments: N/A  Myran Arcia LRT/CTRS         Javonn Gauger 12/28/2016 4:16 PM

## 2016-12-28 NOTE — Plan of Care (Signed)
Problem: Safety: Goal: Ability to remain free from injury will improve Outcome: Progressing Patient maintained safety on unit and called for assistance

## 2016-12-28 NOTE — Progress Notes (Signed)
Inpatient Diabetes Program Recommendations  AACE/ADA: New Consensus Statement on Inpatient Glycemic Control (2015)  Target Ranges:  Prepandial:   less than 140 mg/dL      Peak postprandial:   less than 180 mg/dL (1-2 hours)      Critically ill patients:  140 - 180 mg/dL   Lab Results  Component Value Date   GLUCAP 172 (H) 12/28/2016   HGBA1C 11.4 (H) 12/23/2016    Review of Glycemic Control   Results for [name], [name] (MRN [id number]) as of 12/28/2016 07:42  Ref. Range 12/27/2016 06:58 12/27/2016 11:44 12/27/2016 16:20 12/27/2016 21:20 12/28/2016 07:02  Glucose-Capillary Latest Ref Range: 65 - 99 mg/dL 119 (H) 417 (H) 408 (H) 170 (H) 172 (H)    Diabetes history:DM1 Outpatient Diabetes medications: Lantus 17 units BID, Novolog 0-10 units TID with meals Current orders for Inpatient glycemic control: Lantus 23units BID, Novolog 8units TID with meals, Novolog 0-9 units TID with meals, Novolog 0-5 units QHS, Novolog 2 units qhs with hs snack  Consider increasing mealtime Novolog to 10 units tid and increase Lantus insulin to 24 units bid (from 23 units) Continue all other insulin as ordered.   Debra Racer, RN, BA, MHA, CDE Diabetes Coordinator Inpatient Diabetes Program  [phone] (Team Pager) [phone] [facility] Office) 12/28/2016 7:43 AM

## 2016-12-28 NOTE — Progress Notes (Addendum)
[facility] MD Progress Note  12/28/2016 9:20 AM Debra [name]  MRN:  [id number]   Subjective:  Pt states that she is still feeling depressed. She states that she is trying to see the future and be hopeful but it has been hard because she has no idea where she will be. She is still having suicidal thoughts due to this hopelessness. She reports hearing voices, mostly "mumbling" and no one is there. She states that she has heard this in the past. She states that these voices are very scary. She is sleeping well. She has not had any incontinence episodes yesterday. She did spend some time outside yesterday and felt good to sit out there.   Principal Problem: Major depressive disorder, recurrent episode, severe (HCC) Diagnosis:   Patient Active Problem List   Diagnosis Date Noted  . Dysthymia [F34.1] 12/27/2016  . Major depressive disorder, recurrent episode, severe (HCC) [F33.2]   . Multiple joint pain [M25.50]   . HTN (hypertension) [I10]   . Acute pain of left knee [M25.562]   . Neurogenic bladder [N31.9]   . Acute lower UTI [N39.0]   . Labile blood pressure [R09.89]   . Acute blood loss anemia [D62]   . Leukocytosis [D72.829]   . Labile blood glucose [R73.09]   . Hypertensive crisis [I16.9]   . Hypoglycemia [E16.2]   . Poorly controlled type 2 diabetes mellitus with peripheral neuropathy (HCC) [E11.42, E11.65]   . Uncontrolled hypertension [I10]   . Sleep disturbance [G47.9]   . Multiple sclerosis (HCC) [G35] 11/22/2016  . Thoracic root lesion [G54.3] 11/22/2016  . MS (multiple sclerosis) (HCC) [G35]   . Neuropathic pain [M79.2]   . Uncontrolled type 2 diabetes mellitus with peripheral neuropathy (HCC) [E11.42, E11.65]   . Urinary retention [R33.9]   . Medically noncompliant [Z91.19]   . Leg weakness, bilateral [R29.898] 11/20/2016  . Diabetic ketoacidosis without coma associated with type 1 diabetes mellitus (HCC) [E10.10]   . Leg pain [M79.606]   . History of CVA (cerebrovascular  accident) [Z86.73]   . 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 Benign essential HTN [I10]   . Hypertensive urgency [I16.0]   . AKI (acute kidney injury) (HCC) [N17.9]   . Fibromyalgia [M79.7] 11/07/2016  . Back pain [M54.9] 10/31/2016  . Stable angina pectoris (HCC) [I20.8]   . Vitamin D deficiency [E55.9] 08/31/2014  . 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 Hyperlipidemia [E78.5] 10/06/2013  . Depression [F32.9] 10/16/2012  . Bilateral knee pain [M25.561, M25.562] 02/16/2012  . Chronic diarrhea [K52.9] 10/19/2010  . Peripheral neuropathy [G62.9] 08/19/2010  . Type 1 diabetes mellitus (HCC) [E10.9] 07/06/2010  . Hypertension [I10] 07/06/2010  . Asthma [J45.909] 07/06/2010  . Migraine [G43.909] 07/06/2010   Total Time spent with patient: 20 minutes  Past Psychiatric History: See H&P  Past Medical History:  Past Medical History:  Diagnosis Date  . Adenomatous colonic polyps   . Anemia    2005  . Anxiety    1990  . Arthritis   . Asthma    2000  . Cataract   . Depression 1990  . Difficult intubation    narrow airway  . Gastroesophageal Reflux Disease (GERD)   . Heart murmur    Birth  . Hyperlipidemia    2005  . Hypertension    1998  . Internal hemorrhoids 04/24/06   on colonoscopy  . Migraine   . Neuropathy of the  hands & feet   . Restless Leg Syndrome   . Right Ankle Fracture 10/06/2013  . Sleep paralysis   . Stable angina pectoris    2007: cath showing normal cors.   . Stroke 1990  . Type I Diabetes Mellitus 1988    Past Surgical History:  Procedure Laterality Date  . bilateral foot surgery    . BREAST SURGERY Left    biopsy left breast  . CARDIAC CATHETERIZATION  2007   around 2007 or 2008  . CESAREAN SECTION    . CHOLECYSTECTOMY    . COLONOSCOPY W/ BIOPSIES AND POLYPECTOMY    . DILATION AND CURETTAGE OF UTERUS  12/29/2010   Surgeon: Tereso Newcomer,  MD;  Location: [facility];  Service: Gynecology  . ENDOMETRIAL ABLATION W/ NOVASURE N/A 12/2009  . EYE SURGERY    . HYSTEROSCOPY  12/29/2010   Procedure: HYSTEROSCOPY WITH HYDROTHERMAL ABLATION;  Surgeon: Tereso Newcomer, MD;  Location: [facility];  Service: Gynecology;;  . IUD REMOVAL  12/29/2010   Procedure: INTRAUTERINE DEVICE (IUD) REMOVAL;  Surgeon: Tereso Newcomer, MD;  Location: [facility];  Service: Gynecology;  Laterality: N/A;  . LAPAROSCOPIC TUBAL LIGATION  12/29/2010   Procedure: LAPAROSCOPIC TUBAL LIGATION;  Surgeon: Tereso Newcomer, MD;  Location: [facility];  Service: Gynecology;  Laterality: Bilateral;  . ORIF ANKLE FRACTURE Right 10/09/2013   Procedure: Open reduction internal fixation right ankle;  Surgeon: Mable Paris, MD;  Location: [facility] OR;  Service: Orthopedics;  Laterality: Right;  Open reduction internal fixation right ankle  . TRIGGER FINGER RELEASE     x 3   Family History:  Family History  Problem Relation Age of Onset  . Hypertension Mother   . Kidney disease Mother   . Hypertension Father   . Breast cancer Maternal Grandmother   . Prostate cancer Maternal Grandfather   . Ovarian cancer Paternal Grandmother   . Prostate cancer Paternal Grandfather   . Colon cancer Maternal Uncle        Family history of malignant neoplasm of gastrointestinal tract   Family Psychiatric  History: See H&P Social History:  History  Alcohol Use No     History  Drug Use No    Comment: drug addict    Social History   Social History  . Marital status: Divorced    Spouse name: N/A  . Number of children: 2  . Years of education: 14   Occupational History  . Unemployed    Social History Main Topics  . Smoking status: Never Smoker  . Smokeless tobacco: Never Used  . Alcohol use No  . Drug use: No     Comment: drug addict  . Sexual activity: Yes    Birth control/ protection: Other-see comments     Comment: pt states she had ablation in 2011   Other Topics Concern  .  None   Social History Narrative   Lost medicaid about 2009 ish when her youngest child turned 18. Has adult children. Lives with boyfriend who financially supports her. Has attempted to get disability but has been turned down.      2-3 caffeine drinks daily    Sleep: Fair  Appetite:  Fair  Current Medications: Current Facility-Administered Medications  Medication Dose Route Frequency Provider Last Rate Last Dose  . acetaminophen (TYLENOL) tablet 650 mg  650 mg Oral Q6H PRN Pucilowska, Jolanta B, MD   650 mg at 12/28/16 0911  . albuterol (PROVENTIL HFA;VENTOLIN HFA) 108 (90 Base)  MCG/ACT inhaler 2 puff  2 puff Inhalation Q6H PRN Pucilowska, Jolanta B, MD      . alum & mag hydroxide-simeth (MAALOX/MYLANTA) 200-200-20 MG/5ML suspension 30 mL  30 mL Oral Q4H PRN Pucilowska, Jolanta B, MD      . amLODipine (NORVASC) tablet 10 mg  10 mg Oral Daily Pucilowska, Jolanta B, MD   10 mg at 12/28/16 0803  . aspirin EC tablet 81 mg  81 mg Oral Daily Pucilowska, Jolanta B, MD   81 mg at 12/28/16 0803  . atorvastatin (LIPITOR) tablet 40 mg  40 mg Oral q1800 Pucilowska, Jolanta B, MD   40 mg at 12/27/16 1807  . bethanechol (URECHOLINE) tablet 50 mg  50 mg Oral TID Haskell Riling, MD   50 mg at 12/28/16 0802  . ciprofloxacin (CIPRO) tablet 500 mg  500 mg Oral BID Haskell Riling, MD   500 mg at 12/28/16 0803  . diphenoxylate-atropine (LOMOTIL) 2.5-0.025 MG per tablet 1 tablet  1 tablet Oral QID PRN Rubbie Goostree R, MD      . DULoxetine (CYMBALTA) DR capsule 40 mg  40 mg Oral Daily Beryle Zeitz, Ileene Hutchinson, MD   40 mg at 12/28/16 0802  . etodolac (LODINE) capsule 200 mg  200 mg Oral BID Pucilowska, Jolanta B, MD   200 mg at 12/28/16 0802  . gabapentin (NEURONTIN) tablet 600 mg  600 mg Oral TID Pucilowska, Jolanta B, MD   600 mg at 12/28/16 0803  . hydrALAZINE (APRESOLINE) tablet 25 mg  25 mg Oral Q8H Katha Hamming, MD   25 mg at 12/28/16 5859  . hydrochlorothiazide (HYDRODIURIL) tablet 25 mg  25 mg Oral Daily  Alford Highland, MD   25 mg at 12/28/16 0803  . Influenza vac split quadrivalent PF (FLUARIX) injection 0.5 mL  0.5 mL Intramuscular Tomorrow-1000 Telly Broberg R, MD      . insulin aspart (novoLOG) injection 0-5 Units  0-5 Units Subcutaneous QHS Haskell Riling, MD   2 Units at 12/25/16 2144  . insulin aspart (novoLOG) injection 0-9 Units  0-9 Units Subcutaneous TID WC Beverly Sessions, MD   2 Units at 12/28/16 0803  . insulin aspart (novoLOG) injection 10 Units  10 Units Subcutaneous TID WC Tiffny Gemmer R, MD      . insulin aspart (novoLOG) injection 2 Units  2 Units Subcutaneous Q24H Katha Hamming, MD   2 Units at 12/27/16 2246  . insulin glargine (LANTUS) injection 24 Units  24 Units Subcutaneous BID Auston Halfmann R, MD      . lisinopril (PRINIVIL,ZESTRIL) tablet 20 mg  20 mg Oral BID Alford Highland, MD   20 mg at 12/28/16 0803  . magnesium hydroxide (MILK OF MAGNESIA) suspension 30 mL  30 mL Oral Daily PRN Pucilowska, Jolanta B, MD      . metoprolol succinate (TOPROL-XL) 24 hr tablet 100 mg  100 mg Oral Daily Pucilowska, Jolanta B, MD   100 mg at 12/27/16 0801  . nitroGLYCERIN (NITROSTAT) SL tablet 0.4 mg  0.4 mg Sublingual Q5 min PRN Pucilowska, Jolanta B, MD      . pantoprazole (PROTONIX) EC tablet 40 mg  40 mg Oral Daily Pucilowska, Jolanta B, MD   40 mg at 12/28/16 0803  . tamsulosin (FLOMAX) capsule 0.8 mg  0.8 mg Oral QPC supper Filomena Pokorney, Ileene Hutchinson, MD   0.8 mg at 12/27/16 1807  . traZODone (DESYREL) tablet 50 mg  50 mg Oral QHS PRN Ronne Savoia, Ileene Hutchinson, MD   50 mg  at 12/27/16 2248    Lab Results:  Results for orders placed or performed during the hospital encounter of 12/22/16 (from the past 48 hour(s))  Glucose, capillary     Status: Abnormal   Collection Time: 12/26/16 11:43 AM  Result Value Ref Range   Glucose-Capillary 202 (H) 65 - 99 mg/dL  Glucose, capillary     Status: Abnormal   Collection Time: 12/26/16  4:29 PM  Result Value Ref Range   Glucose-Capillary 188 (H) 65 - 99  mg/dL   Comment 1 Notify RN   Glucose, capillary     Status: Abnormal   Collection Time: 12/26/16  9:11 PM  Result Value Ref Range   Glucose-Capillary 143 (H) 65 - 99 mg/dL   Comment 1 Notify RN    Comment 2 Document in Chart   Glucose, capillary     Status: Abnormal   Collection Time: 12/27/16  6:58 AM  Result Value Ref Range   Glucose-Capillary 278 (H) 65 - 99 mg/dL  Glucose, capillary     Status: Abnormal   Collection Time: 12/27/16 11:44 AM  Result Value Ref Range   Glucose-Capillary 262 (H) 65 - 99 mg/dL   Comment 1 Notify RN   Glucose, capillary     Status: Abnormal   Collection Time: 12/27/16  4:20 PM  Result Value Ref Range   Glucose-Capillary 223 (H) 65 - 99 mg/dL  Glucose, capillary     Status: Abnormal   Collection Time: 12/27/16  9:20 PM  Result Value Ref Range   Glucose-Capillary 170 (H) 65 - 99 mg/dL  Glucose, capillary     Status: Abnormal   Collection Time: 12/28/16  7:02 AM  Result Value Ref Range   Glucose-Capillary 172 (H) 65 - 99 mg/dL   Comment 1 Notify RN     Blood Alcohol level:  Lab Results  Component Value Date   ETH <10 12/20/2016   ETH  12/09/2006    <5        LOWEST DETECTABLE LIMIT FOR SERUM ALCOHOL IS 11 mg/dL FOR MEDICAL PURPOSES ONLY    Metabolic Disorder Labs: Lab Results  Component Value Date   HGBA1C 11.4 (H) 12/23/2016   MPG 280.48 12/23/2016   No results found for: PROLACTIN Lab Results  Component Value Date   CHOL 105 12/23/2016   TRIG 103 12/23/2016   HDL 46 12/23/2016   CHOLHDL 2.3 12/23/2016   VLDL 21 12/23/2016   LDLCALC 38 12/23/2016   LDLCALC 112 (H) 04/21/2015    Physical Findings: AIMS: Facial and Oral Movements Muscles of Facial Expression: None, normal Lips and Perioral Area: None, normal Jaw: None, normal Tongue: None, normal,Extremity Movements Upper (arms, wrists, hands, fingers): None, normal Lower (legs, knees, ankles, toes): None, normal,  , Overall Severity Severity of abnormal movements  (highest score from questions above): None, normal Incapacitation due to abnormal movements: None, normal Patient's awareness of abnormal movements (rate only patient's report): No Awareness, Dental Status Current problems with teeth and/or dentures?: No Does patient usually wear dentures?: No  CIWA:    COWS:     Musculoskeletal: Strength & Muscle Tone: decreased Gait & Station: unsteady Patient leans: N/A  Psychiatric Specialty Exam: Physical Exam  Nursing note and vitals reviewed.   Review of Systems  All other systems reviewed and are negative.   Blood pressure (!) 150/78, pulse 78, temperature 98 F (36.7 C), resp. rate 18, height 5\' 4"  (1.626 m), weight 68 kg (150 lb), SpO2 98 %.Body mass index is  25.75 kg/m.  General Appearance: Disheveled  Eye Contact:  Poor  Speech:  Slow  Volume:  Decreased  Mood:  Depressed  Affect:  Congruent  Thought Process:  Goal Directed  Orientation:  Full (Time, Place, and Person)  Thought Content:  Hallucinations: Auditory  Suicidal Thoughts:  Yes  Homicidal Thoughts:  No  Memory:  Immediate;   Fair  Judgement:  Fair  Insight:  Fair  Psychomotor Activity:  Psychomotor Retardation  Concentration:  Concentration: Fair  Recall:  [employer]Fair  Fund of Knowledge:  Fair  Language:  Fair  Akathisia:  No      Assets:  Communication Skills Resilience  ADL's:  Impaired  Cognition:  WNL  Sleep:  Number of Hours: 6     Treatment Plan Summary: 51 yo female with history of depression and suicidal thoughts with plan to overdose on medication in the context of multiple psychosocial stressors and medical comorbidities. She has chronic depression and suicidal thoughts currently exacerbated by homelessness and recent diagnosis of MS.  Plan:  1. MDD with psychosis -Continue Cymbalta 40 mg daily -Start Risperdal 0.5 mg qhs for hallucinations  2. HTN -IM on board and adjusting medications as needed -Continue HCTZ 25 mg daily, Lisinopril 20 mg BID,  Metoprolol 100 mg daily, Norvasc 10 mg daily, and Hydralazine 25 mg q8hrs  3. UTI -Cipro500 mg BID for 7 days  4. MS -Seen by Neurology. No intervention while in the hospital -PT will continue to work with patient  5. Type I Diabetes -Diabetes care coordinators on board to assist with insulin. Recommended increase Lantus to 24 units qhs, increase aspart to 10 units TID with meals, and sliding scale. aspart 2 units at bedtime if she eats snack  6. Urinary retention/incontince -Continue Flomax 0.8 mg qhs and urecholine TID  7. Dispo/Social -Pt is newly homeless as her boyfriend can no longer take care of her. She has 2 adult children that she identifies as her support system but they are unable to have her live with them with home health.  [facility] is involved with dispo. Pt requires 24/7 supervision (per previous documentation) and would benefit from  SNF/ALF upon discharge. She is not appropriate to discharge to shelter given many medical comorbidities and extensive weakness.   Haskell RilingHolly R Caila Cirelli, MD 12/28/2016, 9:20 AM

## 2016-12-28 NOTE — [facility] Group Notes (Signed)
[facility] LCSW Group Therapy Note  Date/Time: 12/28/16, 0930  Type of Therapy/Topic:  Group Therapy:  Balance in Life  Participation Level:  none  Description of Group:    This group will address the concept of balance and how it feels and looks when one is unbalanced. Patients will be encouraged to process areas in their lives that are out of balance, and identify reasons for remaining unbalanced. Facilitators will guide patients utilizing problem- solving interventions to address and correct the stressor making their life unbalanced. Understanding and applying boundaries will be explored and addressed for obtaining  and maintaining a balanced life. Patients will be encouraged to explore ways to assertively make their unbalanced needs known to significant others in their lives, using other group members and facilitator for support and feedback.  Therapeutic Goals: 1. Patient will identify two or more emotions or situations they have that consume much of in their lives. 2. Patient will identify signs/triggers that life has become out of balance:  3. Patient will identify two ways to set boundaries in order to achieve balance in their lives:  4. Patient will demonstrate ability to communicate their needs through discussion and/or role plays  Summary of Patient Progress:Pt came to group and made no comments.  When CSW called on her, she said she was feeling lightheaded and needed to leave.  One of the nursing students took pt to speak to her RN.          Therapeutic Modalities:   Cognitive Behavioral Therapy Solution-Focused Therapy Assertiveness Training  Daleen Squibb, [place]

## 2016-12-28 NOTE — Plan of Care (Signed)
Problem: Physical Regulation: Goal: Will remain free from infection Outcome: Progressing Patient on antibiotic  Problem: Spiritual Needs Goal: Ability to function at adequate level Outcome: Progressing Patient continues to participate in PT

## 2016-12-29 LAB — GLUCOSE, CAPILLARY
GLUCOSE-CAPILLARY: 110 mg/dL — AB (ref 65–99)
GLUCOSE-CAPILLARY: 88 mg/dL (ref 65–99)
GLUCOSE-CAPILLARY: 155 mg/dL — AB (ref 65–99)
Glucose-Capillary: 67 mg/dL (ref 65–99)
Glucose-Capillary: 132 mg/dL — ABNORMAL HIGH (ref 65–99)

## 2016-12-29 MED ORDER — HYDRALAZINE HCL 50 MG PO TABS
50.0000 mg | ORAL_TABLET | Freq: Three times a day (TID) | ORAL | Status: DC
  Administered 2016-12-29 – 2016-12-31 (×6): 50 mg via ORAL
  Filled 2016-12-29 (×9): qty 1
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
## 2016-12-29 NOTE — Progress Notes (Signed)
Recreation Therapy Notes  Date:11.02.18  Time:9:30 am  Location:[facility]  Behavioral response:N/A  Intervention Topic:Stress  Discussion/Intervention: Patient did not attend group.  Clinical Observations/Feedback:  Patient did not attend group.  Dellia Donnelly LRT/CTRS         Rocco Kerkhoff 12/29/2016 11:59 AM

## 2016-12-29 NOTE — [facility] Group Notes (Signed)
[facility] Group Notes:  (Nursing/MHT/Case Management/Adjunct)  Date:  12/29/2016  Time:  4:42 AM  Type of Therapy:  Psychoeducational Skills  Participation Level:  Did Not Attend   Summary of Progress/Problems:  Chancy Milroy 12/29/2016, 4:42 AM

## 2016-12-29 NOTE — Progress Notes (Signed)
Patient is now on a 1:1 due to her inability to provide self care of her ADL's and needs assistance with ambulation and transfers. Patient is a high fall risk and requires continuous monitoring. Patient is currently lying in bed with eyes closed and sitter at bedside.

## 2016-12-29 NOTE — Plan of Care (Signed)
Problem: Coping: Goal: Ability to cope will improve Outcome: Not Progressing Patient is progressively can"t due to  disease processes  Goal: Ability to verbalize feelings will improve Outcome: Progressing Patient is verbalizing treatment modalitis and disease processes  Problem: Safety: Goal: Periods of time without injury will increase Outcome: Progressing Patient is safe and secure without any injury to self and others

## 2016-12-29 NOTE — Plan of Care (Signed)
Problem: Education: Goal: Knowledge of the prescribed therapeutic regimen will improve Outcome: Progressing Patient is aware of her therapeutic regimen.  Problem: Coping: Goal: Ability to cope will improve Outcome: Not Progressing Patient is not coping well with her current situation. Goal: Ability to verbalize feelings will improve Outcome: Progressing Patient has the ability to verbalize her feelings.  Problem: Safety: Goal: Ability to disclose and discuss suicidal ideas will improve Outcome: Progressing Patient has the ability to disclose suicidal ideation.

## 2016-12-29 NOTE — Progress Notes (Signed)
Recreation Therapy Notes  Date: 11.02.18  Time: 3:00pm  Location: Craft room  Behavioral response: Did not attend  Group Type: Art/Craft  Participation level: Did not attend  Communication: Patient did not attend group.  Comments: N/A  [name] LRT/CTRS        Debra [name] 12/29/2016 4:24 PM

## 2016-12-29 NOTE — Progress Notes (Signed)
Patient alert and oriented x 4. Complained of feeling weak and not being able to stand up and walk nor see very far. Per patient this is an acute occurrence. MD notified of condition. Patient's BS was 116 this morning after a recheck and 88 at lunch. Patient did not feel that it was safe to take her insulin at lunch so ir was not given. Patient in room in this morning complaining of nausea and vomited shortly after she ate her breakfast. Stated afterwards, "I feel better now." Patient will be monitored for safety. Denies SI but continues to express feeling depressed.

## 2016-12-29 NOTE — [facility] Suicide Risk Assessment (Signed)
[facility] INPATIENT:  Family/Significant Other Suicide Prevention Education  Suicide Prevention Education:  Family/Significant Other Refusal to Support Patient after Discharge:  Suicide Prevention Education Not Provided:  Patient has identified home of family/significant other as the place the patient will be residing after discharge.  With written consent of the patient, two attempts were made to provide Suicide Prevention Education to EDYNN KINLOCH, son, [phone].  This person indicates he/she will not be responsible for the patient after discharge.  CSW attempted to discuss housing options for pt with D.  He reports he has asked other family members, cursed out other family members, and no one will take pt in.  He reports it is impossible for his mother to be added to his lease as he is in public housing.  He said at this time there is nothing he can do.  Lorri Frederick, LCSW 12/29/2016,3:42 PM

## 2016-12-29 NOTE — Tx Team (Signed)
Interdisciplinary Treatment and Diagnostic Plan Update  12/29/2016 Time of Session: 1112 Debra [name] MRN: [id number]  Principal Diagnosis: Major depressive disorder, recurrent episode, severe (HCC)  Secondary Diagnoses: Principal Problem:   Major depressive disorder, recurrent episode, severe (HCC) Active Problems:   Type 1 diabetes mellitus (HCC)   Hypertension   Hyperlipidemia   MS (multiple sclerosis) (HCC)   Dysthymia   Current Medications:  Current Facility-Administered Medications  Medication Dose Route Frequency Provider Last Rate Last Dose  . acetaminophen (TYLENOL) tablet 650 mg  650 mg Oral Q6H PRN Pucilowska, Jolanta B, MD   650 mg at 12/28/16 0911  . albuterol (PROVENTIL HFA;VENTOLIN HFA) 108 (90 Base) MCG/ACT inhaler 2 puff  2 puff Inhalation Q6H PRN Pucilowska, Jolanta B, MD      . alum & mag hydroxide-simeth (MAALOX/MYLANTA) 200-200-20 MG/5ML suspension 30 mL  30 mL Oral Q4H PRN Pucilowska, Jolanta B, MD      . amLODipine (NORVASC) tablet 10 mg  10 mg Oral Daily Pucilowska, Jolanta B, MD   10 mg at 12/29/16 0756  . aspirin EC tablet 81 mg  81 mg Oral Daily Pucilowska, Jolanta B, MD   81 mg at 12/29/16 0756  . atorvastatin (LIPITOR) tablet 40 mg  40 mg Oral q1800 Pucilowska, Jolanta B, MD   40 mg at 12/28/16 1716  . bethanechol (URECHOLINE) tablet 50 mg  50 mg Oral TID Haskell Riling, MD   50 mg at 12/29/16 0800  . ciprofloxacin (CIPRO) tablet 500 mg  500 mg Oral BID Haskell Riling, MD   500 mg at 12/29/16 0759  . diphenoxylate-atropine (LOMOTIL) 2.5-0.025 MG per tablet 1 tablet  1 tablet Oral QID PRN McNew, Holly R, MD      . DULoxetine (CYMBALTA) DR capsule 40 mg  40 mg Oral Daily McNew, Ileene Hutchinson, MD   40 mg at 12/29/16 0759  . etodolac (LODINE) capsule 200 mg  200 mg Oral BID Pucilowska, Jolanta B, MD   200 mg at 12/29/16 0759  . gabapentin (NEURONTIN) tablet 600 mg  600 mg Oral TID Pucilowska, Jolanta B, MD   600 mg at 12/29/16 1200  . hydrALAZINE (APRESOLINE)  tablet 25 mg  25 mg Oral Q8H Katha Hamming, MD   25 mg at 12/29/16 0615  . hydrochlorothiazide (HYDRODIURIL) tablet 25 mg  25 mg Oral Daily Alford Highland, MD   25 mg at 12/29/16 0802  . Influenza vac split quadrivalent PF (FLUARIX) injection 0.5 mL  0.5 mL Intramuscular Tomorrow-1000 McNew, Holly R, MD      . insulin aspart (novoLOG) injection 0-5 Units  0-5 Units Subcutaneous QHS Haskell Riling, MD   5 Units at 12/28/16 2132  . insulin aspart (novoLOG) injection 0-9 Units  0-9 Units Subcutaneous TID WC Beverly Sessions, MD   3 Units at 12/28/16 1720  . insulin aspart (novoLOG) injection 10 Units  10 Units Subcutaneous TID WC Haskell Riling, MD   10 Units at 12/29/16 0755  . insulin aspart (novoLOG) injection 2 Units  2 Units Subcutaneous Q24H Katha Hamming, MD   2 Units at 12/28/16 2133  . insulin glargine (LANTUS) injection 24 Units  24 Units Subcutaneous BID Haskell Riling, MD   24 Units at 12/29/16 0801  . lisinopril (PRINIVIL,ZESTRIL) tablet 20 mg  20 mg Oral BID Alford Highland, MD   20 mg at 12/29/16 0756  . magnesium hydroxide (MILK OF MAGNESIA) suspension 30 mL  30 mL Oral Daily PRN Pucilowska, Jolanta B, MD      .  metoprolol succinate (TOPROL-XL) 24 hr tablet 100 mg  100 mg Oral Daily Pucilowska, Jolanta B, MD   100 mg at 12/29/16 0759  . nitroGLYCERIN (NITROSTAT) SL tablet 0.4 mg  0.4 mg Sublingual Q5 min PRN Pucilowska, Jolanta B, MD      . pantoprazole (PROTONIX) EC tablet 40 mg  40 mg Oral Daily Pucilowska, Jolanta B, MD   40 mg at 12/29/16 0756  . risperiDONE (RISPERDAL) tablet 0.5 mg  0.5 mg Oral QHS McNew, Ileene Hutchinson, MD   0.5 mg at 12/28/16 2053  . tamsulosin (FLOMAX) capsule 0.8 mg  0.8 mg Oral QPC supper McNew, Ileene Hutchinson, MD   0.8 mg at 12/28/16 1716  . traZODone (DESYREL) tablet 50 mg  50 mg Oral QHS PRN McNew, Ileene Hutchinson, MD   50 mg at 12/27/16 2248   PTA Medications: Prescriptions Prior to Admission  Medication Sig Dispense Refill Last Dose  . amLODipine  (NORVASC) 10 MG tablet Take 1 tablet (10 mg total) by mouth daily. 90 tablet 1 12/25/2016  . aspirin EC 81 MG tablet Take 1 tablet (81 mg total) by mouth daily. 90 tablet 3 12/25/2016  . atorvastatin (LIPITOR) 40 MG tablet Take 1 tablet (40 mg total) by mouth daily. 90 tablet 3 12/25/2016  . bethanechol (URECHOLINE) 25 MG tablet Take 50 mg by mouth 3 (three) times daily.  0 12/25/2016  . bethanechol (URECHOLINE) 50 MG tablet Take 1 tablet (50 mg total) by mouth 3 (three) times daily. 30 tablet 0 12/25/2016  . 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 The patient tests 4 times per day. 1 Device 0 12/25/2016  . gabapentin (NEURONTIN) 300 MG capsule Take 2 capsules (600 mg total) by mouth 3 (three) times daily. 180 capsule 0 12/25/2016  . glucose blood (AGAMATRIX AMP TEST) test strip Use as instructed 100 each 12 12/25/2016  . hydrALAZINE (APRESOLINE) 25 MG tablet Take 3 tablets (75 mg total) by mouth every 8 (eight) hours. 90 tablet 0 12/25/2016  . insulin aspart (NOVOLOG FLEXPEN) 100 UNIT/ML FlexPen Inject 5 Units into the skin 3 (three) times daily with meals. (Patient taking differently: Inject 0-10 Units into the skin 3 (three) times daily with meals. ) 15 mL 11 12/25/2016  . insulin glargine (LANTUS) 100 unit/mL SOPN Inject 0.17 mLs (17 Units total) into the skin 2 (two) times daily. 15 mL 11 12/25/2016  . insulin glargine (LANTUS) 100 unit/mL SOPN Inject 0.17 mLs (17 Units total) into the skin 2 (two) times daily. 15 mL 11 12/25/2016  . lisinopril (PRINIVIL,ZESTRIL) 20 MG tablet Take 1 tablet (20 mg total) by mouth 2 (two) times daily. 60 tablet 0 12/25/2016  . Meloxicam 10 MG CAPS Take 15 mg by mouth every morning. 30 capsule 0 12/25/2016  . metoprolol succinate (TOPROL-XL) 25 MG 24 hr tablet Take 3 tablets (75 mg total) by mouth daily. 30 tablet 0 12/25/2016  . NITROSTAT 0.4 MG SL tablet PLACE 1 TABLET UNDER TONGUE AS  NEEDED FOR CHEST PAIN EVERY 5 MINUTES X 3 MAX DOSES.  CALL 911 IF PAIN PERSISTS  0 12/25/2016  . oxyCODONE 10 MG TABS Take 1 tablet (10 mg total) by mouth every 4 (four) hours as needed for moderate pain. 20 tablet 0 12/25/2016  . pantoprazole (PROTONIX) 40 MG tablet Take 1 tablet (40 mg total) by mouth daily. 30 tablet 0 12/25/2016  . PROVENTIL HFA 108 (90 Base) MCG/ACT inhaler INHALE 1 TO  2 PUFFS BY MOUTH EVERY 6 HOURS AS NEEDED FOR COUGHING, WHEEZING, OR SHORTNESS OF BREATH  0 12/25/2016  . tamsulosin (FLOMAX) 0.4 MG CAPS capsule Take 2 capsules (0.8 mg total) by mouth daily after breakfast. 30 capsule 0 12/25/2016  . traZODone (DESYREL) 50 MG tablet Take 1 tablet (50 mg total) by mouth at bedtime. 30 tablet 0 12/25/2016    Patient Stressors: Financial difficulties Health problems Legal issue Loss of Home  Patient Strengths: Ability for insight Average or above average intelligence Communication skills  Treatment Modalities: Medication Management, Group therapy, Case management,  1 to 1 session with clinician, Psychoeducation, Recreational therapy.   Physician Treatment Plan for Primary Diagnosis: Major depressive disorder, recurrent episode, severe (HCC) Long Term Goal(s): Improvement in symptoms so as ready for discharge Improvement in symptoms so as ready for discharge   Short Term Goals: Ability to identify changes in lifestyle to reduce recurrence of condition will improve Ability to verbalize feelings will improve Ability to disclose and discuss suicidal ideas Ability to demonstrate self-control will improve Ability to identify and develop effective coping behaviors will improve Ability to maintain clinical measurements within normal limits will improve Compliance with prescribed medications will improve Ability to identify triggers associated with substance abuse/mental health issues will improve Ability to identify changes in lifestyle to reduce recurrence of condition  will improve Ability to verbalize feelings will improve Ability to disclose and discuss suicidal ideas Ability to demonstrate self-control will improve Ability to identify and develop effective coping behaviors will improve Ability to maintain clinical measurements within normal limits will improve Compliance with prescribed medications will improve Ability to identify triggers associated with substance abuse/mental health issues will improve  Medication Management: Evaluate patient's response, side effects, and tolerance of medication regimen.  Therapeutic Interventions: 1 to 1 sessions, Unit Group sessions and Medication administration.  Evaluation of Outcomes: Progressing  Physician Treatment Plan for Secondary Diagnosis: Principal Problem:   Major depressive disorder, recurrent episode, severe (HCC) Active Problems:   Type 1 diabetes mellitus (HCC)   Hypertension   Hyperlipidemia   MS (multiple sclerosis) (HCC)   Dysthymia  Long Term Goal(s): Improvement in symptoms so as ready for discharge Improvement in symptoms so as ready for discharge   Short Term Goals: Ability to identify changes in lifestyle to reduce recurrence of condition will improve Ability to verbalize feelings will improve Ability to disclose and discuss suicidal ideas Ability to demonstrate self-control will improve Ability to identify and develop effective coping behaviors will improve Ability to maintain clinical measurements within normal limits will improve Compliance with prescribed medications will improve Ability to identify triggers associated with substance abuse/mental health issues will improve Ability to identify changes in lifestyle to reduce recurrence of condition will improve Ability to verbalize feelings will improve Ability to disclose and discuss suicidal ideas Ability to demonstrate self-control will improve Ability to identify and develop effective coping behaviors will improve Ability  to maintain clinical measurements within normal limits will improve Compliance with prescribed medications will improve Ability to identify triggers associated with substance abuse/mental health issues will improve     Medication Management: Evaluate patient's response, side effects, and tolerance of medication regimen.  Therapeutic Interventions: 1 to 1 sessions, Unit Group sessions and Medication administration.  Evaluation of Outcomes: Progressing   RN Treatment Plan for Primary Diagnosis: Major depressive disorder, recurrent episode, severe (HCC) Long Term Goal(s): Knowledge of disease and therapeutic regimen to maintain health will improve  Short Term Goals: Ability to remain free from  injury will improve, Ability to verbalize feelings will improve and Compliance with prescribed medications will improve  Medication Management: RN will administer medications as ordered by provider, will assess and evaluate patient's response and provide education to patient for prescribed medication. RN will report any adverse and/or side effects to prescribing provider.  Therapeutic Interventions: 1 on 1 counseling sessions, Psychoeducation, Medication administration, Evaluate responses to treatment, Monitor vital signs and CBGs as ordered, Perform/monitor CIWA, COWS, AIMS and Fall Risk screenings as ordered, Perform wound care treatments as ordered.  Evaluation of Outcomes: Progressing   LCSW Treatment Plan for Primary Diagnosis: Major depressive disorder, recurrent episode, severe (HCC) Long Term Goal(s): Safe transition to appropriate next level of care at discharge, Engage patient in therapeutic group addressing interpersonal concerns.  Short Term Goals: Engage patient in aftercare planning with referrals and resources, Increase social support and Increase skills for wellness and recovery  Therapeutic Interventions: Assess for all discharge needs, 1 to 1 time with Social worker, Explore available  resources and support systems, Assess for adequacy in community support network, Educate family and significant other(s) on suicide prevention, Complete Psychosocial Assessment, Interpersonal group therapy.  Evaluation of Outcomes: Progressing   Progress in Treatment: Attending groups: No. Participating in groups: No. Taking medication as prescribed: Yes. Toleration medication: Yes. Family/Significant other contact made: No, will contact:  son Patient understands diagnosis: Yes. Discussing patient identified problems/goals with staff: Yes. Medical problems stabilized or resolved: Yes. Denies suicidal/homicidal ideation: Yes. Issues/concerns per patient self-inventory: No. Other: none  New problem(s) identified: No, Describe:  none  New Short Term/Long Term Goal(s):Pt goal: to not feel so hopeless about the future.  Discharge Plan or Barriers: CSW assessing for appropriate plan.  Reason for Continuation of Hospitalization: Depression Medication stabilization  Estimated Length of Stay: 5-7 days.  Recreational Therapy: Patient Stressors: Patient suffers from MS and is having trouble dealing with it.  Patient Goal: Patient will participate actively in groups and activities x5 days.   Attendees: Patient:   Physician: Dr Johnella MoloneyMcNew 12/29/16   Nursing: Leonia ReaderPhyllis Cobb, RN 12/29/16  RN Care Manager: 12/29/16   Social Worker: Daleen SquibbGreg Joban Colledge, LCSW 12/29/16  Recreational Therapist: Garret ReddishShay Outlaw, LRT, CTRS 12/29/16  Other:    Other:    Other:       Scribe for Treatment Team: Lorri FrederickWierda, Adeola Dennen Jon, LCSW 12/29/2016 12:02 PM

## 2016-12-29 NOTE — Progress Notes (Signed)
[facility] MD Progress Note  12/29/2016 3:45 PM Debra [name]  MRN:  [id number]   Subjective:  Pt states that she was feeling extremely ill this morning. She was having blurry vision, vomiting, and feeling extremely weak. She states that she worked a little bit with PT but has not been able to walk much. She is feeling slightly better this afternoon. She is feeling very depressed and hopeless at this time but trying to keep hope. She states that she was having a lot of suicidal thoughts this morning but now that she is feeling physically better they are back to baseline. She appears very depressed and hopeless. She denies AH today. We have been discussing with team about placement for patient as she needs 24/7 care.  Principal Problem: Major depressive disorder, recurrent episode, severe (HCC) Diagnosis:   Patient Active Problem List   Diagnosis Date Noted  . Dysthymia [F34.1] 12/27/2016  . Major depressive disorder, recurrent episode, severe (HCC) [F33.2]   . Multiple joint pain [M25.50]   . HTN (hypertension) [I10]   . Acute pain of left knee [M25.562]   . Neurogenic bladder [N31.9]   . Acute lower UTI [N39.0]   . Labile blood pressure [R09.89]   . Acute blood loss anemia [D62]   . Leukocytosis [D72.829]   . Labile blood glucose [R73.09]   . Hypertensive crisis [I16.9]   . Hypoglycemia [E16.2]   . Poorly controlled type 2 diabetes mellitus with peripheral neuropathy (HCC) [E11.42, E11.65]   . Uncontrolled hypertension [I10]   . Sleep disturbance [G47.9]   . Multiple sclerosis (HCC) [G35] 11/22/2016  . Thoracic root lesion [G54.3] 11/22/2016  . MS (multiple sclerosis) (HCC) [G35]   . Neuropathic pain [M79.2]   . Uncontrolled type 2 diabetes mellitus with peripheral neuropathy (HCC) [E11.42, E11.65]   . Urinary retention [R33.9]   . Medically noncompliant [Z91.19]   . Leg weakness, bilateral [R29.898] 11/20/2016  . Diabetic ketoacidosis without coma associated with type 1 diabetes  mellitus (HCC) [E10.10]   . Leg pain [M79.606]   . History of CVA (cerebrovascular accident) [Z86.73]   . 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 Benign essential HTN [I10]   . Hypertensive urgency [I16.0]   . AKI (acute kidney injury) (HCC) [N17.9]   . Fibromyalgia [M79.7] 11/07/2016  . Back pain [M54.9] 10/31/2016  . Stable angina pectoris (HCC) [I20.8]   . Vitamin D deficiency [E55.9] 08/31/2014  . 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 Hyperlipidemia [E78.5] 10/06/2013  . Depression [F32.9] 10/16/2012  . Bilateral knee pain [M25.561, M25.562] 02/16/2012  . Chronic diarrhea [K52.9] 10/19/2010  . Peripheral neuropathy [G62.9] 08/19/2010  . Type 1 diabetes mellitus (HCC) [E10.9] 07/06/2010  . Hypertension [I10] 07/06/2010  . Asthma [J45.909] 07/06/2010  . Migraine [G43.909] 07/06/2010   Total Time spent with patient: 20 minutes  Past Psychiatric History: See H&P  Past Medical History:  Past Medical History:  Diagnosis Date  . Adenomatous colonic polyps   . Anemia    2005  . Anxiety    1990  . Arthritis   . Asthma    2000  . Cataract   . Depression 1990  . Difficult intubation    narrow airway  . Gastroesophageal Reflux Disease (GERD)   . Heart murmur    Birth  . Hyperlipidemia    2005  . Hypertension    1998  . Internal hemorrhoids 04/24/06   on  colonoscopy  . Migraine   . Neuropathy of the hands & feet   . Restless Leg Syndrome   . Right Ankle Fracture 10/06/2013  . Sleep paralysis   . Stable angina pectoris    2007: cath showing normal cors.   . Stroke 1990  . Type I Diabetes Mellitus 1988    Past Surgical History:  Procedure Laterality Date  . bilateral foot surgery    . BREAST SURGERY Left    biopsy left breast  . CARDIAC CATHETERIZATION  2007   around 2007 or 2008  . CESAREAN SECTION    . CHOLECYSTECTOMY    . COLONOSCOPY W/ BIOPSIES AND  POLYPECTOMY    . DILATION AND CURETTAGE OF UTERUS  12/29/2010   Surgeon: Tereso Newcomer, MD;  Location: [facility];  Service: Gynecology  . ENDOMETRIAL ABLATION W/ NOVASURE N/A 12/2009  . EYE SURGERY    . HYSTEROSCOPY  12/29/2010   Procedure: HYSTEROSCOPY WITH HYDROTHERMAL ABLATION;  Surgeon: Tereso Newcomer, MD;  Location: [facility];  Service: Gynecology;;  . IUD REMOVAL  12/29/2010   Procedure: INTRAUTERINE DEVICE (IUD) REMOVAL;  Surgeon: Tereso Newcomer, MD;  Location: [facility];  Service: Gynecology;  Laterality: N/A;  . LAPAROSCOPIC TUBAL LIGATION  12/29/2010   Procedure: LAPAROSCOPIC TUBAL LIGATION;  Surgeon: Tereso Newcomer, MD;  Location: [facility];  Service: Gynecology;  Laterality: Bilateral;  . ORIF ANKLE FRACTURE Right 10/09/2013   Procedure: Open reduction internal fixation right ankle;  Surgeon: Mable Paris, MD;  Location: [facility];  Service: Orthopedics;  Laterality: Right;  Open reduction internal fixation right ankle  . TRIGGER FINGER RELEASE     x 3   Family History:  Family History  Problem Relation Age of Onset  . Hypertension Mother   . Kidney disease Mother   . Hypertension Father   . Breast cancer Maternal Grandmother   . Prostate cancer Maternal Grandfather   . Ovarian cancer Paternal Grandmother   . Prostate cancer Paternal Grandfather   . Colon cancer Maternal Uncle        Family history of malignant neoplasm of gastrointestinal tract   Family Psychiatric  History: See H&P Social History:  History  Alcohol Use No     History  Drug Use No    Comment: drug addict    Social History   Social History  . Marital status: Divorced    Spouse name: N/A  . Number of children: 2  . Years of education: 14   Occupational History  . Unemployed    Social History Main Topics  . Smoking status: Never Smoker  . Smokeless tobacco: Never Used  . Alcohol use No  . Drug use: No     Comment: drug addict  . Sexual activity: Yes    Birth control/ protection:  Other-see comments     Comment: pt states she had ablation in 2011   Other Topics Concern  . None   Social History Narrative   Lost medicaid about 2009 ish when her youngest child turned 51. Has adult children. Lives with boyfriend who financially supports her. Has attempted to get disability but has been turned down.      2-3 caffeine drinks daily     Sleep: Good  Appetite:  Good  Current Medications: Current Facility-Administered Medications  Medication Dose Route Frequency Provider Last Rate Last Dose  . acetaminophen (TYLENOL) tablet 650 mg  650 mg Oral Q6H PRN Pucilowska, Jolanta B, MD   650 mg at  12/28/16 0911  . albuterol (PROVENTIL HFA;VENTOLIN HFA) 108 (90 Base) MCG/ACT inhaler 2 puff  2 puff Inhalation Q6H PRN Pucilowska, Jolanta B, MD      . alum & mag hydroxide-simeth (MAALOX/MYLANTA) 200-200-20 MG/5ML suspension 30 mL  30 mL Oral Q4H PRN Pucilowska, Jolanta B, MD      . amLODipine (NORVASC) tablet 10 mg  10 mg Oral Daily Pucilowska, Jolanta B, MD   10 mg at 12/29/16 0756  . aspirin EC tablet 81 mg  81 mg Oral Daily Pucilowska, Jolanta B, MD   81 mg at 12/29/16 0756  . atorvastatin (LIPITOR) tablet 40 mg  40 mg Oral q1800 Pucilowska, Jolanta B, MD   40 mg at 12/28/16 1716  . bethanechol (URECHOLINE) tablet 50 mg  50 mg Oral TID Haskell Riling, MD   50 mg at 12/29/16 1204  . ciprofloxacin (CIPRO) tablet 500 mg  500 mg Oral BID Haskell Riling, MD   500 mg at 12/29/16 0759  . diphenoxylate-atropine (LOMOTIL) 2.5-0.025 MG per tablet 1 tablet  1 tablet Oral QID PRN Creta Dorame R, MD      . DULoxetine (CYMBALTA) DR capsule 40 mg  40 mg Oral Daily Uzma Hellmer, Ileene Hutchinson, MD   40 mg at 12/29/16 0759  . etodolac (LODINE) capsule 200 mg  200 mg Oral BID Pucilowska, Jolanta B, MD   200 mg at 12/29/16 0759  . gabapentin (NEURONTIN) tablet 600 mg  600 mg Oral TID Pucilowska, Jolanta B, MD   600 mg at 12/29/16 1200  . hydrALAZINE (APRESOLINE) tablet 50 mg  50 mg Oral Q8H Katha Hamming,  MD   50 mg at 12/29/16 1429  . hydrochlorothiazide (HYDRODIURIL) tablet 25 mg  25 mg Oral Daily Alford Highland, MD   25 mg at 12/29/16 0802  . Influenza vac split quadrivalent PF (FLUARIX) injection 0.5 mL  0.5 mL Intramuscular Tomorrow-1000 Ardie Dragoo R, MD      . insulin aspart (novoLOG) injection 0-5 Units  0-5 Units Subcutaneous QHS Haskell Riling, MD   5 Units at 12/28/16 2132  . insulin aspart (novoLOG) injection 0-9 Units  0-9 Units Subcutaneous TID WC Beverly Sessions, MD   3 Units at 12/28/16 1720  . insulin aspart (novoLOG) injection 10 Units  10 Units Subcutaneous TID WC Haskell Riling, MD   10 Units at 12/29/16 0755  . insulin aspart (novoLOG) injection 2 Units  2 Units Subcutaneous Q24H Katha Hamming, MD   2 Units at 12/28/16 2133  . insulin glargine (LANTUS) injection 24 Units  24 Units Subcutaneous BID Haskell Riling, MD   24 Units at 12/29/16 0801  . lisinopril (PRINIVIL,ZESTRIL) tablet 20 mg  20 mg Oral BID Alford Highland, MD   20 mg at 12/29/16 0756  . magnesium hydroxide (MILK OF MAGNESIA) suspension 30 mL  30 mL Oral Daily PRN Pucilowska, Jolanta B, MD      . metoprolol succinate (TOPROL-XL) 24 hr tablet 100 mg  100 mg Oral Daily Pucilowska, Jolanta B, MD   100 mg at 12/29/16 0759  . nitroGLYCERIN (NITROSTAT) SL tablet 0.4 mg  0.4 mg Sublingual Q5 min PRN Pucilowska, Jolanta B, MD      . pantoprazole (PROTONIX) EC tablet 40 mg  40 mg Oral Daily Pucilowska, Jolanta B, MD   40 mg at 12/29/16 0756  . risperiDONE (RISPERDAL) tablet 0.5 mg  0.5 mg Oral QHS Jerami Tammen, Ileene Hutchinson, MD   0.5 mg at 12/28/16 2053  . tamsulosin (FLOMAX) capsule  0.8 mg  0.8 mg Oral QPC supper Haskell Riling, MD   0.8 mg at 12/28/16 1716  . traZODone (DESYREL) tablet 50 mg  50 mg Oral QHS PRN Ajiah Mcglinn, Ileene Hutchinson, MD   50 mg at 12/27/16 2248    Lab Results:  Results for orders placed or performed during the hospital encounter of 12/22/16 (from the past 48 hour(s))  Glucose, capillary     Status: Abnormal    Collection Time: 12/27/16  4:20 PM  Result Value Ref Range   Glucose-Capillary 223 (H) 65 - 99 mg/dL  Glucose, capillary     Status: Abnormal   Collection Time: 12/27/16  9:20 PM  Result Value Ref Range   Glucose-Capillary 170 (H) 65 - 99 mg/dL  Glucose, capillary     Status: Abnormal   Collection Time: 12/28/16  7:02 AM  Result Value Ref Range   Glucose-Capillary 172 (H) 65 - 99 mg/dL   Comment 1 Notify RN   Glucose, capillary     Status: Abnormal   Collection Time: 12/28/16 10:08 AM  Result Value Ref Range   Glucose-Capillary 169 (H) 65 - 99 mg/dL  Glucose, capillary     Status: Abnormal   Collection Time: 12/28/16  5:02 PM  Result Value Ref Range   Glucose-Capillary 228 (H) 65 - 99 mg/dL  Glucose, capillary     Status: Abnormal   Collection Time: 12/28/16  8:59 PM  Result Value Ref Range   Glucose-Capillary 144 (H) 65 - 99 mg/dL  Glucose, capillary     Status: None   Collection Time: 12/29/16  6:59 AM  Result Value Ref Range   Glucose-Capillary 67 65 - 99 mg/dL  Glucose, capillary     Status: Abnormal   Collection Time: 12/29/16  7:28 AM  Result Value Ref Range   Glucose-Capillary 110 (H) 65 - 99 mg/dL  Glucose, capillary     Status: None   Collection Time: 12/29/16 11:21 AM  Result Value Ref Range   Glucose-Capillary 88 65 - 99 mg/dL    Blood Alcohol level:  Lab Results  Component Value Date   ETH <10 12/20/2016   ETH  12/09/2006    <5        LOWEST DETECTABLE LIMIT FOR SERUM ALCOHOL IS 11 mg/dL FOR MEDICAL PURPOSES ONLY    Metabolic Disorder Labs: Lab Results  Component Value Date   HGBA1C 11.4 (H) 12/23/2016   MPG 280.48 12/23/2016   No results found for: PROLACTIN Lab Results  Component Value Date   CHOL 105 12/23/2016   TRIG 103 12/23/2016   HDL 46 12/23/2016   CHOLHDL 2.3 12/23/2016   VLDL 21 12/23/2016   LDLCALC 38 12/23/2016   LDLCALC 112 (H) 04/21/2015    Physical Findings: AIMS: Facial and Oral Movements Muscles of Facial  Expression: None, normal Lips and Perioral Area: None, normal Jaw: None, normal Tongue: None, normal,Extremity Movements Upper (arms, wrists, hands, fingers): None, normal Lower (legs, knees, ankles, toes): None, normal,  , Overall Severity Severity of abnormal movements (highest score from questions above): None, normal Incapacitation due to abnormal movements: None, normal Patient's awareness of abnormal movements (rate only patient's report): No Awareness, Dental Status Current problems with teeth and/or dentures?: No Does patient usually wear dentures?: No   Musculoskeletal: Strength & Muscle Tone: Very Weak LE Gait & Station: unsteady Patient leans: N/A  Psychiatric Specialty Exam: Physical Exam  Nursing note and vitals reviewed.   Review of Systems  All other systems reviewed and  are negative.   Blood pressure (!) 176/72, pulse 72, temperature 98 F (36.7 C), resp. rate 16, height 5\' 4"  (1.626 m), weight 68 kg (150 lb), SpO2 97 %.Body mass index is 25.75 kg/m.  General Appearance: Disheveled  Eye Contact:  Poor  Speech:  Slow  Volume:  Decreased  Mood:  Depressed  Affect:  Flat  Thought Process:  Coherent  Orientation:  Full (Time, Place, and Person)  Thought Content:  Negative  Suicidal Thoughts:  Yes.  with intent/plan  Homicidal Thoughts:  No  Memory:  Immediate;   Fair  Judgement:  Intact  Insight:  Fair  Psychomotor Activity:  Normal  Concentration:  Concentration: Fair  Recall:  [employer]  Fund of Knowledge:  Fair  Language:  Fair  Akathisia:  No      Assets:  Communication Skills Resilience  ADL's:  Impaired  Cognition:  WNL  Sleep:  Number of Hours: 8.15     Treatment Plan Summary:51 yo female with history of depression and suicidal thoughts with plan to overdose on medication in the context of multiple psychosocial stressors and medical comorbidities. She has chronic depression and suicidal thoughts currently exacerbated by homelessness and recent  diagnosis of MS.  Plan:  1. MDD with psychosis -Contiue Cymbalta 40 mg daily -continue Risperdal 0.5 mg qhs for hallucinations (now resolved)  2. HTN -IM on board and adjusting meds --Continue HCTZ 25 mg daily, Lisinopril 20 mg BID, Metoprolol 100 mg daily, Norvasc 10 mg daily, and Hydralazine 25 mg q8hrs  3. UTI -Cipro500 mg BID for 7 days  4. MS -Seen by Neurology. No intervention while in the hospital -PT will continue to work with patient  5. Type I Diabetes -Diabetes care coordinators on board to assist with insulin. Recommended increase Lantus to 24 units qhs, increase aspart to 10 units TID with meals, and sliding scale. aspart 2 units at bedtime if she eats snack  6. Urinary retention/incontince -Continue Flomax 0.8 mg qhs and urecholine TID  7. Dispo/Social -Pt is newly homeless as her boyfriend can no longer take care of her. She has 2 adult children that she identifies as her support system but they are unable to have her live with them with home health. APS is involved with dispo. Pt requires 24/7 supervision (per previous documentation) and would require  SNF/ALF upon discharge. She is not appropriate to discharge to shelter given many medical comorbidities and extensive weakness.   Haskell RilingHolly R Jarvis Sawa, MD 12/29/2016, 3:45 PM

## 2016-12-30 DIAGNOSIS — F333 Major depressive disorder, recurrent, severe with psychotic symptoms: Principal | ICD-10-CM

## 2016-12-30 LAB — GLUCOSE, CAPILLARY
GLUCOSE-CAPILLARY: 133 mg/dL — AB (ref 65–99)
Glucose-Capillary: 232 mg/dL — ABNORMAL HIGH (ref 65–99)
Glucose-Capillary: 132 mg/dL — ABNORMAL HIGH (ref 65–99)
Glucose-Capillary: 98 mg/dL (ref 65–99)

## 2016-12-30 MED ORDER — LOPERAMIDE HCL 2 MG PO CAPS
4.0000 mg | ORAL_CAPSULE | ORAL | Status: DC | PRN
  Administered 2016-12-30: 4 mg via ORAL
  Filled 2016-12-30: qty 2

## 2016-12-30 NOTE — Progress Notes (Addendum)
[facility]  12/30/2016 3:02 PM Debra [name]  MRN:  [id number]  Subjective:   Debra [name] is completely overwhelmed with her physical problems. She is unable to walk and due to diarrhea, not a new problem, and incontinence related to MS she has not been able to leave her room at all. Her mood is improving slightly but she is utterly hopeless and suicidal. Sitter at bedside. Long discussion about her resources or lack thereof. She is confident that, with assistence, she could complete disability application on line and undergo necessary phone interview.  Treatment plan. There will be no medication adjustment today.  Social/disposition. There is no viable plan at this point.  Principal Problem: Major depressive disorder, single episode, severe with psychotic features (HCC) Diagnosis:   Patient Active Problem List   Diagnosis Date Noted  . Dysthymia [F34.1] 12/27/2016  . Major depressive disorder, single episode, severe with psychotic features (HCC) [F32.3]   . Multiple joint pain [M25.50]   . HTN (hypertension) [I10]   . Acute pain of left knee [M25.562]   . Neurogenic bladder [N31.9]   . Acute lower UTI [N39.0]   . Labile blood pressure [R09.89]   . Acute blood loss anemia [D62]   . Leukocytosis [D72.829]   . Labile blood glucose [R73.09]   . Hypertensive crisis [I16.9]   . Hypoglycemia [E16.2]   . Poorly controlled type 2 diabetes mellitus with peripheral neuropathy (HCC) [E11.42, E11.65]   . Uncontrolled hypertension [I10]   . Sleep disturbance [G47.9]   . Multiple sclerosis (HCC) [G35] 11/22/2016  . Thoracic root lesion [G54.3] 11/22/2016  . MS (multiple sclerosis) (HCC) [G35]   . Neuropathic pain [M79.2]   . Uncontrolled type 2 diabetes mellitus with peripheral neuropathy (HCC) [E11.42, E11.65]   . Urinary retention [R33.9]   . Medically noncompliant [Z91.19]   . Leg weakness, bilateral [R29.898] 11/20/2016  . Diabetic ketoacidosis without coma associated with  type 1 diabetes mellitus (HCC) [E10.10]   . Leg pain [M79.606]   . History of CVA (cerebrovascular accident) [Z86.73]   . 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 Benign essential HTN [I10]   . Hypertensive urgency [I16.0]   . AKI (acute kidney injury) (HCC) [N17.9]   . Fibromyalgia [M79.7] 11/07/2016  . Back pain [M54.9] 10/31/2016  . Stable angina pectoris (HCC) [I20.8]   . Vitamin D deficiency [E55.9] 08/31/2014  . 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 Hyperlipidemia [E78.5] 10/06/2013  . Depression [F32.9] 10/16/2012  . Bilateral knee pain [M25.561, M25.562] 02/16/2012  . Chronic diarrhea [K52.9] 10/19/2010  . Peripheral neuropathy [G62.9] 08/19/2010  . Type 1 diabetes mellitus (HCC) [E10.9] 07/06/2010  . Hypertension [I10] 07/06/2010  . Asthma [J45.909] 07/06/2010  . Migraine [G43.909] 07/06/2010   Total Time spent with patient: 20 minutes  Past Psychiatric History: none prior to current episode od disabling MS.  Past Medical History:  Past Medical History:  Diagnosis Date  . Adenomatous colonic polyps   . Anemia    2005  . Anxiety    1990  . Arthritis   . Asthma    2000  . Cataract   . Depression 1990  . Difficult intubation    narrow airway  . Gastroesophageal Reflux Disease (GERD)   . Heart murmur    Birth  . Hyperlipidemia    2005  . Hypertension    1998  . Internal hemorrhoids 04/24/06   on  colonoscopy  . Migraine   . Neuropathy of the hands & feet   . Restless Leg Syndrome   . Right Ankle Fracture 10/06/2013  . Sleep paralysis   . Stable angina pectoris    2007: cath showing normal cors.   . Stroke 1990  . Type I Diabetes Mellitus 1988    Past Surgical History:  Procedure Laterality Date  . bilateral foot surgery    . BREAST SURGERY Left    biopsy left breast  . CARDIAC CATHETERIZATION  2007   around 2007 or 2008  . CESAREAN SECTION    .  CHOLECYSTECTOMY    . COLONOSCOPY W/ BIOPSIES AND POLYPECTOMY    . DILATION AND CURETTAGE OF UTERUS  12/29/2010   Surgeon: Tereso Newcomer, MD;  Location: [facility];  Service: Gynecology  . ENDOMETRIAL ABLATION W/ NOVASURE N/A 12/2009  . EYE SURGERY    . HYSTEROSCOPY  12/29/2010   Procedure: HYSTEROSCOPY WITH HYDROTHERMAL ABLATION;  Surgeon: Tereso Newcomer, MD;  Location: [facility];  Service: Gynecology;;  . IUD REMOVAL  12/29/2010   Procedure: INTRAUTERINE DEVICE (IUD) REMOVAL;  Surgeon: Tereso Newcomer, MD;  Location: [facility];  Service: Gynecology;  Laterality: N/A;  . LAPAROSCOPIC TUBAL LIGATION  12/29/2010   Procedure: LAPAROSCOPIC TUBAL LIGATION;  Surgeon: Tereso Newcomer, MD;  Location: [facility];  Service: Gynecology;  Laterality: Bilateral;  . ORIF ANKLE FRACTURE Right 10/09/2013   Procedure: Open reduction internal fixation right ankle;  Surgeon: Mable Paris, MD;  Location: [facility];  Service: Orthopedics;  Laterality: Right;  Open reduction internal fixation right ankle  . TRIGGER FINGER RELEASE     x 3   Family History:  Family History  Problem Relation Age of Onset  . Hypertension Mother   . Kidney disease Mother   . Hypertension Father   . Breast cancer Maternal Grandmother   . Prostate cancer Maternal Grandfather   . Ovarian cancer Paternal Grandmother   . Prostate cancer Paternal Grandfather   . Colon cancer Maternal Uncle        Family history of malignant neoplasm of gastrointestinal tract   Family Psychiatric  History: None reported. Social History:  History  Alcohol Use No     History  Drug Use No    Comment: drug addict    Social History   Social History  . Marital status: Divorced    Spouse name: N/A  . Number of children: 2  . Years of education: 14   Occupational History  . Unemployed    Social History Main Topics  . Smoking status: Never Smoker  . Smokeless tobacco: Never Used  . Alcohol use No  . Drug use: No     Comment: drug addict  .  Sexual activity: Yes    Birth control/ protection: Other-see comments     Comment: pt states she had ablation in 2011   Other Topics Concern  . None   Social History Narrative   Lost medicaid about 2009 ish when her youngest child turned 28. Has adult children. Lives with boyfriend who financially supports her. Has attempted to get disability but has been turned down.      2-3 caffeine drinks daily    Additional Social History:                         Sleep: Fair  Appetite:  Fair  Current Medications: Current Facility-Administered Medications  Medication Dose Route Frequency Provider  Last Rate Last Dose  . acetaminophen (TYLENOL) tablet 650 mg  650 mg Oral Q6H PRN Messina Kosinski B, MD   650 mg at 12/28/16 0911  . albuterol (PROVENTIL HFA;VENTOLIN HFA) 108 (90 Base) MCG/ACT inhaler 2 puff  2 puff Inhalation Q6H PRN Leeyah Heather B, MD      . alum & mag hydroxide-simeth (MAALOX/MYLANTA) 200-200-20 MG/5ML suspension 30 mL  30 mL Oral Q4H PRN Emari Demmer B, MD      . amLODipine (NORVASC) tablet 10 mg  10 mg Oral Daily Mohammedali Bedoy B, MD   10 mg at 12/30/16 0814  . aspirin EC tablet 81 mg  81 mg Oral Daily Dago Jungwirth B, MD   81 mg at 12/30/16 0813  . atorvastatin (LIPITOR) tablet 40 mg  40 mg Oral q1800 Amiel Sharrow B, MD   40 mg at 12/29/16 1712  . bethanechol (URECHOLINE) tablet 50 mg  50 mg Oral TID Haskell Riling, MD   50 mg at 12/30/16 1143  . ciprofloxacin (CIPRO) tablet 500 mg  500 mg Oral BID Haskell Riling, MD   500 mg at 12/30/16 0814  . diphenoxylate-atropine (LOMOTIL) 2.5-0.025 MG per tablet 1 tablet  1 tablet Oral QID PRN McNew, Holly R, MD      . DULoxetine (CYMBALTA) DR capsule 40 mg  40 mg Oral Daily McNew, Ileene Hutchinson, MD   40 mg at 12/30/16 0814  . etodolac (LODINE) capsule 200 mg  200 mg Oral BID Marijayne Rauth B, MD   200 mg at 12/30/16 0815  . gabapentin (NEURONTIN) tablet 600 mg  600 mg Oral TID Milady Fleener  B, MD   600 mg at 12/30/16 1144  . hydrALAZINE (APRESOLINE) tablet 50 mg  50 mg Oral Q8H Katha Hamming, MD   50 mg at 12/30/16 0727  . hydrochlorothiazide (HYDRODIURIL) tablet 25 mg  25 mg Oral Daily Alford Highland, MD   25 mg at 12/30/16 0814  . Influenza vac split quadrivalent PF (FLUARIX) injection 0.5 mL  0.5 mL Intramuscular Tomorrow-1000 McNew, Holly R, MD      . insulin aspart (novoLOG) injection 0-5 Units  0-5 Units Subcutaneous QHS Haskell Riling, MD   5 Units at 12/29/16 2144  . insulin aspart (novoLOG) injection 0-9 Units  0-9 Units Subcutaneous TID WC Beverly Sessions, MD   3 Units at 12/30/16 1143  . insulin aspart (novoLOG) injection 10 Units  10 Units Subcutaneous TID WC McNew, Ileene Hutchinson, MD   10 Units at 12/30/16 1143  . insulin aspart (novoLOG) injection 2 Units  2 Units Subcutaneous Q24H Katha Hamming, MD   2 Units at 12/29/16 2149  . insulin glargine (LANTUS) injection 24 Units  24 Units Subcutaneous BID Haskell Riling, MD   24 Units at 12/30/16 0813  . lisinopril (PRINIVIL,ZESTRIL) tablet 20 mg  20 mg Oral BID Alford Highland, MD   20 mg at 12/30/16 0814  . magnesium hydroxide (MILK OF MAGNESIA) suspension 30 mL  30 mL Oral Daily PRN Georgi Navarrete B, MD      . metoprolol succinate (TOPROL-XL) 24 hr tablet 100 mg  100 mg Oral Daily Kielyn Kardell B, MD   100 mg at 12/30/16 0815  . nitroGLYCERIN (NITROSTAT) SL tablet 0.4 mg  0.4 mg Sublingual Q5 min PRN Jayelyn Barno B, MD      . pantoprazole (PROTONIX) EC tablet 40 mg  40 mg Oral Daily Amayiah Gosnell B, MD   40 mg at 12/30/16 0813  .  risperiDONE (RISPERDAL) tablet 0.5 mg  0.5 mg Oral QHS McNew, Ileene Hutchinson, MD   0.5 mg at 12/29/16 2142  . tamsulosin (FLOMAX) capsule 0.8 mg  0.8 mg Oral QPC supper McNew, Ileene Hutchinson, MD   0.8 mg at 12/29/16 1712  . traZODone (DESYREL) tablet 50 mg  50 mg Oral QHS PRN McNew, Ileene Hutchinson, MD   50 mg at 12/27/16 2248    Lab Results:  Results for orders placed or performed  during the hospital encounter of 12/22/16 (from the past 48 hour(s))  Glucose, capillary     Status: Abnormal   Collection Time: 12/28/16  5:02 PM  Result Value Ref Range   Glucose-Capillary 228 (H) 65 - 99 mg/dL  Glucose, capillary     Status: Abnormal   Collection Time: 12/28/16  8:59 PM  Result Value Ref Range   Glucose-Capillary 144 (H) 65 - 99 mg/dL  Glucose, capillary     Status: None   Collection Time: 12/29/16  6:59 AM  Result Value Ref Range   Glucose-Capillary 67 65 - 99 mg/dL  Glucose, capillary     Status: Abnormal   Collection Time: 12/29/16  7:28 AM  Result Value Ref Range   Glucose-Capillary 110 (H) 65 - 99 mg/dL  Glucose, capillary     Status: None   Collection Time: 12/29/16 11:21 AM  Result Value Ref Range   Glucose-Capillary 88 65 - 99 mg/dL  Glucose, capillary     Status: Abnormal   Collection Time: 12/29/16  4:37 PM  Result Value Ref Range   Glucose-Capillary 155 (H) 65 - 99 mg/dL  Glucose, capillary     Status: Abnormal   Collection Time: 12/29/16  9:55 PM  Result Value Ref Range   Glucose-Capillary 132 (H) 65 - 99 mg/dL  Glucose, capillary     Status: Abnormal   Collection Time: 12/30/16  6:58 AM  Result Value Ref Range   Glucose-Capillary 133 (H) 65 - 99 mg/dL  Glucose, capillary     Status: Abnormal   Collection Time: 12/30/16 11:40 AM  Result Value Ref Range   Glucose-Capillary 232 (H) 65 - 99 mg/dL    Blood Alcohol level:  Lab Results  Component Value Date   ETH <10 12/20/2016   ETH  12/09/2006    <5        LOWEST DETECTABLE LIMIT FOR SERUM ALCOHOL IS 11 mg/dL FOR MEDICAL PURPOSES ONLY    Metabolic Disorder Labs: Lab Results  Component Value Date   HGBA1C 11.4 (H) 12/23/2016   MPG 280.48 12/23/2016   No results found for: PROLACTIN Lab Results  Component Value Date   CHOL 105 12/23/2016   TRIG 103 12/23/2016   HDL 46 12/23/2016   CHOLHDL 2.3 12/23/2016   VLDL 21 12/23/2016   LDLCALC 38 12/23/2016   LDLCALC 112 (H) 04/21/2015     Physical Findings: AIMS: Facial and Oral Movements Muscles of Facial Expression: None, normal Lips and Perioral Area: None, normal Jaw: None, normal Tongue: None, normal,Extremity Movements Upper (arms, wrists, hands, fingers): None, normal Lower (legs, knees, ankles, toes): None, normal,  , Overall Severity Severity of abnormal movements (highest score from questions above): None, normal Incapacitation due to abnormal movements: None, normal Patient's awareness of abnormal movements (rate only patient's report): No Awareness, Dental Status Current problems with teeth and/or dentures?: No Does patient usually wear dentures?: No  CIWA:    COWS:     Musculoskeletal: Strength & Muscle Tone: decreased Gait & Station: unable to  stand Patient leans: N/A  Psychiatric Specialty Exam: Physical Exam  Nursing note and vitals reviewed. Psychiatric: Her speech is normal. Judgment normal. Her affect is blunt. She is slowed and withdrawn. Cognition and memory are normal. She exhibits a depressed mood. She expresses suicidal ideation.    Review of Systems  Gastrointestinal: Positive for diarrhea.  Genitourinary: Positive for urgency.  Musculoskeletal: Positive for falls.  Neurological: Positive for focal weakness and weakness.  Psychiatric/Behavioral: Positive for depression and suicidal ideas.  All other systems reviewed and are negative.   Blood pressure 118/60, pulse 72, temperature 98.2 F (36.8 C), temperature source Oral, resp. rate 16, height 5\' 4"  (1.626 m), weight 68 kg (150 lb), SpO2 100 %.Body mass index is 25.75 kg/m.  General Appearance: Casual  Eye Contact:  Good  Speech:  Clear and Coherent  Volume:  Decreased  Mood:  Depressed and Hopeless  Affect:  Congruent  Thought Process:  Goal Directed and Descriptions of Associations: Intact  Orientation:  Full (Time, Place, and Person)  Thought Content:  WDL  Suicidal Thoughts:  Yes.  without intent/plan  Homicidal  Thoughts:  No  Memory:  Immediate;   Fair Recent;   Fair Remote;   Fair  Judgement:  Impaired  Insight:  Shallow  Psychomotor Activity:  Psychomotor Retardation  Concentration:  Concentration: Fair and Attention Span: Fair  Recall:  [employer] of Knowledge:  Fair  Language:  Fair  Akathisia:  No  Handed:  Right  AIMS (if indicated):     Assets:  Communication Skills Desire for Improvement Resilience  ADL's:  Intact  Cognition:  WNL  Sleep:  Number of Hours: 8.3     Treatment Plan Summary: Daily contact with patient to assess and evaluate symptoms and progress in treatment and Medication management   51 yo female with history of depression and suicidal thoughts with plan to overdose on medication in the context of multiple psychosocial stressors and medical comorbidities. She has chronic depression and suicidal thoughts currently exacerbated by homelessness and recent diagnosis of MS.  Plan:  1. MDD with psychosis -Contiue Cymbalta 40 mg daily -continue Risperdal 0.5 mg qhs for hallucinations (now resolved)  2. HTN -IM on board and adjusting meds --Continue HCTZ 25 mg daily, Lisinopril 20 mg BID, Metoprolol 100 mg daily, Norvasc 10 mg daily, and Hydralazine 25 mg q8hrs  3. UTI -Cipro500 mg BID for 7 days  4. MS -Seen by Neurology. No intervention while in the hospital -PT will continue to work with patient  5. Type I Diabetes -Diabetes care coordinators on board to assist with insulin. Recommended increase Lantus to 24 units qhs, increase aspart to 10 units TID with meals, and sliding scale. aspart 2 units at bedtime if she eats snack  6. Urinary retention/incontince -Continue Flomax 0.8 mg qhs and urecholine TID  7. Dispo/Social -Pt is newly homeless as her boyfriend can no longer take care of her. She has 2 adult children that she identifies as her support system but they are unable to have her live with them with home health. APS is involved with dispo.  Pt requires 24/7 supervision (per previous documentation) and would require SNF/ALF upon discharge. She is not appropriate to discharge to shelter given many medical comorbiditiesand extensive weakness.   Kristine Linea, MD 12/30/2016, 3:02 PM

## 2016-12-30 NOTE — Progress Notes (Signed)
Patient lying in bed with eyes closed repositioned self in bed. Remains safe with 1:1 order.

## 2016-12-30 NOTE — Progress Notes (Signed)
Patient experienced diarrhea episode. Patient was cleaned up by MHT and nurse. Imodium 4 mg administered to patient. Results pending, remains safe with 1:1 sitter.

## 2016-12-30 NOTE — Progress Notes (Signed)
Patient sitting on the side of her bed eating dinner without difficulty. Sitter at bedside. Safety remains intact.

## 2016-12-30 NOTE — Plan of Care (Signed)
Problem: Coping: Goal: Ability to cope will improve Outcome: Progressing Patient is weak lower extremities on 1:1 for assist with ADLs other wise doing well  Problem: Education: Goal: Knowledge of [facility] Education information/materials will improve Outcome: Progressing Patient is aware of general resources in the surround area

## 2016-12-30 NOTE — Progress Notes (Signed)
Patient lying in bed resting with eyes closed. Sitter at bedside. No complaints of voiced.

## 2016-12-30 NOTE — Progress Notes (Signed)
Patient sitting on the side of the bed eating her lunch, remains on 1:1. States that appetite was good. No complaints of voiced.

## 2016-12-30 NOTE — Progress Notes (Signed)
Patient  In bed resting with eyes closed, sitter at bedside.

## 2016-12-30 NOTE — Progress Notes (Signed)
Patient in bed resting with eyes closed, sitter at bedside, no complaints voiced.

## 2016-12-30 NOTE — Plan of Care (Signed)
Problem: Education: Goal: Knowledge of the prescribed therapeutic regimen will improve Outcome: Progressing Patient is knowledgeable of therapeutic regimen.  Problem: Coping: Goal: Ability to cope will improve Outcome: Progressing Patient has shown the ability to cope with her current condition. Goal: Ability to verbalize feelings will improve Outcome: Progressing Patient has the ability to verbalize feelings.  Problem: Safety: Goal: Ability to disclose and discuss suicidal ideas will improve Outcome: Progressing Patient denies suicidal ideation.  Problem: Safety: Goal: Ability to remain free from injury will improve Outcome: Progressing Patient remains free from injury.

## 2016-12-30 NOTE — Progress Notes (Signed)
Patient in room lying in bed resting with eyes closed. Remains on 1:1 for safety reasons.

## 2016-12-30 NOTE — Progress Notes (Signed)
Patient continues to isolate self to room due to the inability to ambulate. Sitter continues to be at bedside. No complaints of voiced.

## 2016-12-30 NOTE — Progress Notes (Signed)
Patient in bed with eyes closed. Safety remains safe with sitter at bedside.

## 2016-12-30 NOTE — [facility] Group Notes (Signed)
[facility] Group Notes:  (Nursing/MHT/Case Management/Adjunct)  Date:  12/30/2016  Time:  11:08 PM  Type of Therapy:  Psychoeducational Skills  Participation Level:  Did Not Attend  Summary of Progress/Problems:  Debra [name] 12/30/2016, 11:08 PM

## 2016-12-30 NOTE — [facility] Group Notes (Signed)
[facility] Group Notes:  (Nursing/MHT/Case Management/Adjunct)  Date:  12/30/2016  Time:  5:02 AM  Type of Therapy:  Psychoeducational Skills  Participation Level:  Did Not Attend  Summary of Progress/Problems:  Debra [name] 12/30/2016, 5:02 AM

## 2016-12-30 NOTE — Progress Notes (Signed)
Patient in room sitting on the side of the talking to sitter at her side. No complaints of voiced. PO fluids encouraged.

## 2016-12-30 NOTE — Progress Notes (Signed)
Patient sitting on the side of the bed eating breakfast, sitter at bedside. Patient states, "I can see better today."  Sitter stated,  "She walked to the bedside commode this morning without any issues." No complaints voiced.

## 2016-12-31 ENCOUNTER — Inpatient Hospital Stay: Payer: No Typology Code available for payment source

## 2016-12-31 LAB — GLUCOSE, CAPILLARY
GLUCOSE-CAPILLARY: 133 mg/dL — AB (ref 65–99)
GLUCOSE-CAPILLARY: 90 mg/dL (ref 65–99)
GLUCOSE-CAPILLARY: 174 mg/dL — AB (ref 65–99)
GLUCOSE-CAPILLARY: 64 mg/dL — AB (ref 65–99)
GLUCOSE-CAPILLARY: 148 mg/dL — AB (ref 65–99)
Glucose-Capillary: 92 mg/dL (ref 65–99)

## 2016-12-31 MED ORDER — GADOBENATE DIMEGLUMINE 529 MG/ML IV SOLN
14.0000 mL | Freq: Once | INTRAVENOUS | Status: AC | PRN
  Administered 2016-12-31: 14 mL via INTRAVENOUS

## 2016-12-31 MED ORDER — INSULIN GLARGINE 100 UNIT/ML ~~LOC~~ SOLN
15.0000 [IU] | Freq: Two times a day (BID) | SUBCUTANEOUS | Status: DC
  Filled 2016-12-31 (×2): qty 0.15

## 2016-12-31 NOTE — Progress Notes (Signed)
Patient remains in recliner chair in front of nurses station for continuous observation. Patient offered water and snack, patient declines snack for now. Patient continues to rest in recliner without signs or symptoms of pain. Patient updated about going to radiology to have MRI procedure later today, Patient verbalized understanding and agreement. Nurse will continue to monitor patient. Patient declined groups today.

## 2016-12-31 NOTE — Progress Notes (Addendum)
12 PM to 1PM During lunch patient had to be fed. Patient took a three bites out of a ham sandwich and 3 spoonfuls of mac and cheese and several sips of tea for lunch. Patient remains outside of nursing station in Dexter chair for constant observation. Patient denies pain. Patient was able to take medications. Patient now resting in recliner with arm alarm present.

## 2016-12-31 NOTE — Plan of Care (Signed)
Patient  lacks the support from family . Patient is no participatory with unit programing . Flat affect . Does not engage  with her peers

## 2016-12-31 NOTE — Progress Notes (Signed)
8 AM to 9 AM Patient laying in the bed resting, received instructions on use of call bell alarm.

## 2016-12-31 NOTE — Progress Notes (Addendum)
Patient continues to rest in recliner outside of nursing station, patient remains safe and comfortable. Patient denies pain. Patient offered water and snack to eat. Patient declines snack. Patient took medication without hesitation. Patient states she is sleepy. Nurse will continue to monitor patient.

## 2016-12-31 NOTE — [facility] Group Notes (Signed)
LCSW Group Therapy Note  12/31/2016 1:15pm  Type of Therapy/Topic:  Group Therapy:  Balance in Life  Participation Level:  Pt invited. Did not attend.  Jonathon Jordan, MSW, LCSWA 12/31/2016 2:41 PM

## 2016-12-31 NOTE — Progress Notes (Signed)
Patient was taken to her room in order to be placed on bedside commode. Patient sat on bedside commode for about 15 minutes without producing urine or feces. Patient was then placed in her bed with the assistance of staff members. Patients bed is near the nurses station with door open to be easily monitored. Patient also has a wrist alarm on her left arm and she verbalized understanding of how to use if needing staff. Patient is resting in bed without pain or discomfort.  Patient is no longer able to bare weight on her legs. It takes 2 to 3 staff to help patient into a wheel chair or to the recliner. Patient must be fed meals and prompted to swallow at times. MRI of the brain was done earlier. Patient declines groups and spends most of the day asleep and very lethargic.

## 2016-12-31 NOTE — Progress Notes (Addendum)
[facility]  12/31/2016 7:48 PM Debra LacrosseCheryl Lynette [name]  MRN:  [id number]  Subjective:   [name] is weaker today, unable to hold a spoon and hardly interacting with me. She had a bout of diarrhea yesterday that resolved with Loperamid. Spoke with Dr. Thad Rangereynolds, neurology, who recommended new MRI.  IMPRESSION: 1. New T2 hyperintense lesion within the genu of the corpus callosum with restricted diffusion compatible with acute demyelination. 2. Multiple other periventricular T2 hyperintensities with diffuse involvement of the colossal septal margin and extension into the corona radiata bilaterally compatible with a demyelinating process. Multiple lesions demonstrate active demyelination evidence by restricted diffusion and subtle enhancement. 3. Progression of diffuse sinusitis.  There was report from nursing staff that the patient has not been able to swallow. Brayton CavesJessie, her nurse tonight evaluated her. The patient is able to swallow liquids and crackers given time.  Treatment plan. We will continue current reginem while awaiting neurology consultation.  Social/disposition. The patient has absolutely no resources.  Principal Problem: Major depressive disorder, single episode, severe with psychotic features (HCC) Diagnosis:   Patient Active Problem List   Diagnosis Date Noted  . Dysthymia [F34.1] 12/27/2016  . Major depressive disorder, single episode, severe with psychotic features (HCC) [F32.3]   . Multiple joint pain [M25.50]   . HTN (hypertension) [I10]   . Acute pain of left knee [M25.562]   . Neurogenic bladder [N31.9]   . Acute lower UTI [N39.0]   . Labile blood pressure [R09.89]   . Acute blood loss anemia [D62]   . Leukocytosis [D72.829]   . Labile blood glucose [R73.09]   . Hypertensive crisis [I16.9]   . Hypoglycemia [E16.2]   . Poorly controlled type 2 diabetes mellitus with peripheral neuropathy (HCC) [E11.42, E11.65]   . Uncontrolled hypertension [I10]   . Sleep  disturbance [G47.9]   . Multiple sclerosis (HCC) [G35] 11/22/2016  . Thoracic root lesion [G54.3] 11/22/2016  . MS (multiple sclerosis) (HCC) [G35]   . Neuropathic pain [M79.2]   . Uncontrolled type 2 diabetes mellitus with peripheral neuropathy (HCC) [E11.42, E11.65]   . Urinary retention [R33.9]   . Medically noncompliant [Z91.19]   . Leg weakness, bilateral [R29.898] 11/20/2016  . Diabetic ketoacidosis without coma associated with type 1 diabetes mellitus (HCC) [E10.10]   . Leg pain [M79.606]   . History of CVA (cerebrovascular accident) [Z86.73]   . 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 Benign essential HTN [I10]   . Hypertensive urgency [I16.0]   . AKI (acute kidney injury) (HCC) [N17.9]   . Fibromyalgia [M79.7] 11/07/2016  . Back pain [M54.9] 10/31/2016  . Stable angina pectoris (HCC) [I20.8]   . Vitamin D deficiency [E55.9] 08/31/2014  . 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 Hyperlipidemia [E78.5] 10/06/2013  . Depression [F32.9] 10/16/2012  . Bilateral knee pain [M25.561, M25.562] 02/16/2012  . Chronic diarrhea [K52.9] 10/19/2010  . Peripheral neuropathy [G62.9] 08/19/2010  . Type 1 diabetes mellitus (HCC) [E10.9] 07/06/2010  . Hypertension [I10] 07/06/2010  . Asthma [J45.909] 07/06/2010  . Migraine [G43.909] 07/06/2010   Total Time spent with patient: 20 minutes  Past Psychiatric History: None.  Past Medical History:  Past Medical History:  Diagnosis Date  . Adenomatous colonic polyps   . Anemia    2005  . Anxiety    1990  . Arthritis   . Asthma    2000  . Cataract   . Depression 1990  .  Difficult intubation    narrow airway  . Gastroesophageal Reflux Disease (GERD)   . Heart murmur    Birth  . Hyperlipidemia    2005  . Hypertension    1998  . Internal hemorrhoids 04/24/06   on colonoscopy  . Migraine   . Neuropathy of the hands & feet   .  Restless Leg Syndrome   . Right Ankle Fracture 10/06/2013  . Sleep paralysis   . Stable angina pectoris    2007: cath showing normal cors.   . Stroke 1990  . Type I Diabetes Mellitus 1988    Past Surgical History:  Procedure Laterality Date  . bilateral foot surgery    . BREAST SURGERY Left    biopsy left breast  . CARDIAC CATHETERIZATION  2007   around 2007 or 2008  . CESAREAN SECTION    . CHOLECYSTECTOMY    . COLONOSCOPY W/ BIOPSIES AND POLYPECTOMY    . ENDOMETRIAL ABLATION W/ NOVASURE N/A 12/2009  . EYE SURGERY    . TRIGGER FINGER RELEASE     x 3   Family History:  Family History  Problem Relation Age of Onset  . Hypertension Mother   . Kidney disease Mother   . Hypertension Father   . Breast cancer Maternal Grandmother   . Prostate cancer Maternal Grandfather   . Ovarian cancer Paternal Grandmother   . Prostate cancer Paternal Grandfather   . Colon cancer Maternal Uncle        Family history of malignant neoplasm of gastrointestinal tract   Family Psychiatric  History: unknown. Social History:  Social History   Substance and Sexual Activity  Alcohol Use No  . Alcohol/week: 0.0 oz     Social History   Substance and Sexual Activity  Drug Use No   Comment: drug addict    Social History   Socioeconomic History  . Marital status: Divorced    Spouse name: None  . Number of children: 2  . Years of education: 37  . Highest education level: None  Social Needs  . Financial resource strain: None  . Food insecurity - worry: None  . Food insecurity - inability: None  . Transportation needs - medical: None  . Transportation needs - non-medical: None  Occupational History  . Occupation: Unemployed  Tobacco Use  . Smoking status: Never Smoker  . Smokeless tobacco: Never Used  Substance and Sexual Activity  . Alcohol use: No    Alcohol/week: 0.0 oz  . Drug use: No    Comment: drug addict  . Sexual activity: Yes    Birth control/protection: Other-see  comments    Comment: pt states she had ablation in 2011  Other Topics Concern  . None  Social History Narrative   Lost medicaid about 2009 ish when her youngest child turned 70. Has adult children. Lives with boyfriend who financially supports her. Has attempted to get disability but has been turned down.      2-3 caffeine drinks daily    Additional Social History:                         Sleep: Fair  Appetite:  Fair  Current Medications: Current Facility-Administered Medications  Medication Dose Route Frequency Provider Last Rate Last Dose  . acetaminophen (TYLENOL) tablet 650 mg  650 mg Oral Q6H PRN Pucilowska, Jolanta B, MD   650 mg at 12/28/16 0911  . albuterol (PROVENTIL HFA;VENTOLIN HFA) 108 (90 Base) MCG/ACT inhaler  2 puff  2 puff Inhalation Q6H PRN Pucilowska, Jolanta B, MD      . alum & mag hydroxide-simeth (MAALOX/MYLANTA) 200-200-20 MG/5ML suspension 30 mL  30 mL Oral Q4H PRN Pucilowska, Jolanta B, MD      . amLODipine (NORVASC) tablet 10 mg  10 mg Oral Daily Pucilowska, Jolanta B, MD   10 mg at 12/31/16 0811  . aspirin EC tablet 81 mg  81 mg Oral Daily Pucilowska, Jolanta B, MD   81 mg at 12/31/16 0811  . atorvastatin (LIPITOR) tablet 40 mg  40 mg Oral q1800 Pucilowska, Jolanta B, MD   40 mg at 12/31/16 1755  . bethanechol (URECHOLINE) tablet 50 mg  50 mg Oral TID Haskell Riling, MD   50 mg at 12/31/16 1755  . ciprofloxacin (CIPRO) tablet 500 mg  500 mg Oral BID Haskell Riling, MD   500 mg at 12/31/16 0938  . diphenoxylate-atropine (LOMOTIL) 2.5-0.025 MG per tablet 1 tablet  1 tablet Oral QID PRN McNew, Holly R, MD      . DULoxetine (CYMBALTA) DR capsule 40 mg  40 mg Oral Daily McNew, Ileene Hutchinson, MD   40 mg at 12/31/16 1829  . etodolac (LODINE) capsule 200 mg  200 mg Oral BID Pucilowska, Jolanta B, MD   200 mg at 12/31/16 1757  . gabapentin (NEURONTIN) tablet 600 mg  600 mg Oral TID Pucilowska, Jolanta B, MD   600 mg at 12/31/16 1755  . hydrALAZINE (APRESOLINE)  tablet 50 mg  50 mg Oral Q8H Katha Hamming, MD   50 mg at 12/31/16 1356  . hydrochlorothiazide (HYDRODIURIL) tablet 25 mg  25 mg Oral Daily Alford Highland, MD   25 mg at 12/31/16 0811  . Influenza vac split quadrivalent PF (FLUARIX) injection 0.5 mL  0.5 mL Intramuscular Tomorrow-1000 McNew, Holly R, MD      . insulin aspart (novoLOG) injection 0-5 Units  0-5 Units Subcutaneous QHS Haskell Riling, MD   5 Units at 12/29/16 2144  . insulin aspart (novoLOG) injection 0-9 Units  0-9 Units Subcutaneous TID WC Beverly Sessions, MD   2 Units at 12/31/16 1628  . insulin aspart (novoLOG) injection 10 Units  10 Units Subcutaneous TID WC McNew, Ileene Hutchinson, MD   10 Units at 12/31/16 1630  . insulin aspart (novoLOG) injection 2 Units  2 Units Subcutaneous Q24H Katha Hamming, MD   2 Units at 12/29/16 2149  . insulin glargine (LANTUS) injection 24 Units  24 Units Subcutaneous BID Haskell Riling, MD   24 Units at 12/31/16 1758  . lisinopril (PRINIVIL,ZESTRIL) tablet 20 mg  20 mg Oral BID Alford Highland, MD   20 mg at 12/31/16 1755  . loperamide (IMODIUM) capsule 4 mg  4 mg Oral PRN Pucilowska, Jolanta B, MD   4 mg at 12/30/16 1820  . magnesium hydroxide (MILK OF MAGNESIA) suspension 30 mL  30 mL Oral Daily PRN Pucilowska, Jolanta B, MD      . metoprolol succinate (TOPROL-XL) 24 hr tablet 100 mg  100 mg Oral Daily Pucilowska, Jolanta B, MD   100 mg at 12/31/16 0814  . nitroGLYCERIN (NITROSTAT) SL tablet 0.4 mg  0.4 mg Sublingual Q5 min PRN Pucilowska, Jolanta B, MD      . pantoprazole (PROTONIX) EC tablet 40 mg  40 mg Oral Daily Pucilowska, Jolanta B, MD   40 mg at 12/31/16 0811  . risperiDONE (RISPERDAL) tablet 0.5 mg  0.5 mg Oral QHS McNew, Ileene Hutchinson, MD  0.5 mg at 12/30/16 0005  . tamsulosin (FLOMAX) capsule 0.8 mg  0.8 mg Oral QPC supper McNew, Ileene Hutchinson, MD   0.8 mg at 12/31/16 1814  . traZODone (DESYREL) tablet 50 mg  50 mg Oral QHS PRN McNew, Ileene Hutchinson, MD   50 mg at 12/27/16 2248    Lab Results:   Results for orders placed or performed during the hospital encounter of 12/22/16 (from the past 48 hour(s))  Glucose, capillary     Status: Abnormal   Collection Time: 12/29/16  9:55 PM  Result Value Ref Range   Glucose-Capillary 132 (H) 65 - 99 mg/dL  Glucose, capillary     Status: Abnormal   Collection Time: 12/30/16  6:58 AM  Result Value Ref Range   Glucose-Capillary 133 (H) 65 - 99 mg/dL  Glucose, capillary     Status: Abnormal   Collection Time: 12/30/16 11:40 AM  Result Value Ref Range   Glucose-Capillary 232 (H) 65 - 99 mg/dL  Glucose, capillary     Status: Abnormal   Collection Time: 12/30/16  4:30 PM  Result Value Ref Range   Glucose-Capillary 132 (H) 65 - 99 mg/dL  Glucose, capillary     Status: None   Collection Time: 12/30/16  9:07 PM  Result Value Ref Range   Glucose-Capillary 98 65 - 99 mg/dL  Glucose, capillary     Status: Abnormal   Collection Time: 12/31/16  6:54 AM  Result Value Ref Range   Glucose-Capillary 133 (H) 65 - 99 mg/dL  Glucose, capillary     Status: None   Collection Time: 12/31/16 11:27 AM  Result Value Ref Range   Glucose-Capillary 90 65 - 99 mg/dL   Comment 1 Document in Chart   Glucose, capillary     Status: Abnormal   Collection Time: 12/31/16  4:26 PM  Result Value Ref Range   Glucose-Capillary 174 (H) 65 - 99 mg/dL    Blood Alcohol level:  Lab Results  Component Value Date   ETH <10 12/20/2016   ETH  12/09/2006    <5        LOWEST DETECTABLE LIMIT FOR SERUM ALCOHOL IS 11 mg/dL FOR MEDICAL PURPOSES ONLY    Metabolic Disorder Labs: Lab Results  Component Value Date   HGBA1C 11.4 (H) 12/23/2016   MPG 280.48 12/23/2016   No results found for: PROLACTIN Lab Results  Component Value Date   CHOL 105 12/23/2016   TRIG 103 12/23/2016   HDL 46 12/23/2016   CHOLHDL 2.3 12/23/2016   VLDL 21 12/23/2016   LDLCALC 38 12/23/2016   LDLCALC 112 (H) 04/21/2015    Physical Findings: AIMS: Facial and Oral Movements Muscles of  Facial Expression: None, normal Lips and Perioral Area: None, normal Jaw: None, normal Tongue: None, normal,Extremity Movements Upper (arms, wrists, hands, fingers): None, normal Lower (legs, knees, ankles, toes): None, normal,  , Overall Severity Severity of abnormal movements (highest score from questions above): None, normal Incapacitation due to abnormal movements: None, normal Patient's awareness of abnormal movements (rate only patient's report): No Awareness, Dental Status Current problems with teeth and/or dentures?: No Does patient usually wear dentures?: No  CIWA:    COWS:     Musculoskeletal: Strength & Muscle Tone: decreased Gait & Station: unable to stand Patient leans: N/A  Psychiatric Specialty Exam: Physical Exam  ROS  Blood pressure 138/63, pulse 79, temperature 98.2 F (36.8 C), temperature source Oral, resp. rate 18, height 5\' 4"  (1.626 m), weight 68 kg (150 lb),  SpO2 100 %.Body mass index is 25.75 kg/m.  General Appearance: Fairly Groomed  Eye Contact:  Fair  Speech:  Slow  Volume:  Decreased  Mood:  Depressed and Hopeless  Affect:  Blunt  Thought Process:  Goal Directed and Descriptions of Associations: Intact  Orientation:  Full (Time, Place, and Person)  Thought Content:  WDL  Suicidal Thoughts:  Yes.  with intent/plan  Homicidal Thoughts:  No  Memory:  Immediate;   Fair Recent;   Fair Remote;   Fair  Judgement:  Poor  Insight:  Lacking  Psychomotor Activity:  Psychomotor Retardation  Concentration:  Concentration: Poor and Attention Span: Poor  Recall:  Poor  Fund of Knowledge:  Fair  Language:  Fair  Akathisia:  No  Handed:  Right  AIMS (if indicated):     Assets:  Communication Skills Desire for Improvement Resilience  ADL's:  Intact  Cognition:  WNL  Sleep:  Number of Hours: 7.45     Treatment Plan Summary: Daily contact with patient to assess and evaluate symptoms and progress in treatment and Medication management   51 yo  female with history of depression and suicidal thoughts with plan to overdose on medication in the context of multiple psychosocial stressors and medical comorbidities. She has chronic depression and suicidal thoughts currently exacerbated by homelessness and recent diagnosis of MS.  Plan:  1. MDD with psychosis -Contiue Cymbalta 40 mg daily -continue Risperdal 0.5 mg qhs for hallucinations (now resolved)  2. HTN -IM on board and adjusting meds --Continue HCTZ 25 mg daily, Lisinopril 20 mg BID, Metoprolol 100 mg daily, Norvasc 10 mg daily, and Hydralazine 25 mg q8hrs  3. UTI -Cipro500 mg BID for 7 days  4. MS -Seen by Neurology. No intervention while in the hospital -PT will continue to work with patient -New MRI obtained 12/31/2016 shows new lesions -Neurology consult pending -Consider Speech pathology consultation  5. Type I Diabetes -Diabetes care coordinators on board to assist with insulin. Recommended increase Lantus to 24 units qhs, increase aspart to 10 units TID with meals, and sliding scale. aspart 2 units at bedtime if she eats snack -We will give 15 units of Lantus tonight as her oral intake is very low  6. Urinary retention/incontince -Continue Flomax 0.8 mg qhs and urecholine TID  7. Dispo/Social -Pt is newly homeless as her boyfriend can no longer take care of her. She has 2 adult children that she identifies as her support system but they are unable to have her live with them with home health. APS is involved with dispo. Pt requires 24/7 supervision (per previous documentation) and would requireSNF/ALF upon discharge. She is not appropriate to discharge to shelter given many medical comorbiditiesand extensive weakness.     Kristine Linea, MD 12/31/2016, 7:48 PM

## 2016-12-31 NOTE — Progress Notes (Signed)
D: Placed in  Recliner chair  For comfort . Able to go to bathroom prior . Affect flat on approach,  Voice low  And soft,  Limited eye contact Placed alram on patient . Slitting in front of nursing station

## 2016-12-31 NOTE — Progress Notes (Signed)
Patient sitting in recliner near nurses station for constant supervision. Wrist alarm placed on patient for safety measures. Patient resting in recliner; reports no pain.

## 2016-12-31 NOTE — Progress Notes (Addendum)
4 PM to 5:40 PM  Patient went down to have MRI of brain. Patient tolerated procedure well. Once back on the unit patient received medication and dinner tray.Patient had to be fed during meal time. Patient no longer is able to feed self. Patient placed in front of nurses station for continued observation. Patient reports no pain, denies SI and HI. Nurse will continue to monitor.

## 2017-01-01 ENCOUNTER — Inpatient Hospital Stay
Admission: AD | Admit: 2017-01-01 | Discharge: 2017-01-02 | DRG: 059 | Disposition: A | Discharge status: Other Acute Inpatient Hospital | Payer: Medicaid Other | Source: Ambulatory Visit | Attending: Internal Medicine | Admitting: Internal Medicine

## 2017-01-01 ENCOUNTER — Other Ambulatory Visit: Payer: Self-pay

## 2017-01-01 ENCOUNTER — Encounter: Payer: Self-pay | Admitting: *Deleted

## 2017-01-01 DIAGNOSIS — E785 Hyperlipidemia, unspecified: Secondary | ICD-10-CM | POA: Diagnosis present

## 2017-01-01 DIAGNOSIS — E1040 Type 1 diabetes mellitus with diabetic neuropathy, unspecified: Secondary | ICD-10-CM | POA: Diagnosis present

## 2017-01-01 DIAGNOSIS — Z794 Long term (current) use of insulin: Secondary | ICD-10-CM | POA: Diagnosis not present

## 2017-01-01 DIAGNOSIS — G2581 Restless legs syndrome: Secondary | ICD-10-CM | POA: Diagnosis present

## 2017-01-01 DIAGNOSIS — F333 Major depressive disorder, recurrent, severe with psychotic symptoms: Secondary | ICD-10-CM | POA: Diagnosis present

## 2017-01-01 DIAGNOSIS — G4700 Insomnia, unspecified: Secondary | ICD-10-CM | POA: Diagnosis present

## 2017-01-01 DIAGNOSIS — Z23 Encounter for immunization: Secondary | ICD-10-CM

## 2017-01-01 DIAGNOSIS — K219 Gastro-esophageal reflux disease without esophagitis: Secondary | ICD-10-CM | POA: Diagnosis present

## 2017-01-01 DIAGNOSIS — R633 Feeding difficulties: Secondary | ICD-10-CM | POA: Diagnosis present

## 2017-01-01 DIAGNOSIS — Z8673 Personal history of transient ischemic attack (TIA), and cerebral infarction without residual deficits: Secondary | ICD-10-CM | POA: Diagnosis not present

## 2017-01-01 DIAGNOSIS — N390 Urinary tract infection, site not specified: Secondary | ICD-10-CM | POA: Diagnosis present

## 2017-01-01 DIAGNOSIS — I10 Essential (primary) hypertension: Secondary | ICD-10-CM | POA: Diagnosis present

## 2017-01-01 DIAGNOSIS — M797 Fibromyalgia: Secondary | ICD-10-CM | POA: Diagnosis present

## 2017-01-01 DIAGNOSIS — G35 Multiple sclerosis: Principal | ICD-10-CM | POA: Diagnosis present

## 2017-01-01 DIAGNOSIS — Z818 Family history of other mental and behavioral disorders: Secondary | ICD-10-CM | POA: Diagnosis not present

## 2017-01-01 DIAGNOSIS — R45851 Suicidal ideations: Secondary | ICD-10-CM | POA: Diagnosis present

## 2017-01-01 LAB — GLUCOSE, CAPILLARY
GLUCOSE-CAPILLARY: 77 mg/dL (ref 65–99)
Glucose-Capillary: 105 mg/dL — ABNORMAL HIGH (ref 65–99)
Glucose-Capillary: 211 mg/dL — ABNORMAL HIGH (ref 65–99)

## 2017-01-01 MED ORDER — RISPERIDONE 1 MG PO TABS
1.0000 mg | ORAL_TABLET | Freq: Every day | ORAL | Status: DC
  Administered 2017-01-01: 1 mg via ORAL
  Filled 2017-01-01: qty 1

## 2017-01-01 MED ORDER — AMLODIPINE BESYLATE 5 MG PO TABS
5.0000 mg | ORAL_TABLET | Freq: Every day | ORAL | Status: DC
  Administered 2017-01-02: 5 mg via ORAL
  Filled 2017-01-01: qty 1

## 2017-01-01 MED ORDER — DULOXETINE HCL 30 MG PO CPEP
60.0000 mg | ORAL_CAPSULE | Freq: Every day | ORAL | Status: DC
  Administered 2017-01-02: 60 mg via ORAL
  Filled 2017-01-01: qty 2

## 2017-01-01 MED ORDER — INFLUENZA VAC SPLIT QUAD 0.5 ML IM SUSY
0.5000 mL | PREFILLED_SYRINGE | INTRAMUSCULAR | Status: AC
  Administered 2017-01-02: 0.5 mL via INTRAMUSCULAR
  Filled 2017-01-01: qty 0.5

## 2017-01-01 MED ORDER — BETHANECHOL CHLORIDE 10 MG PO TABS
5.0000 mg | ORAL_TABLET | Freq: Three times a day (TID) | ORAL | Status: DC
  Administered 2017-01-01 – 2017-01-02 (×2): 5 mg via ORAL
  Filled 2017-01-01 (×2): qty 1
  Filled 2017-01-01: qty 0.5

## 2017-01-01 MED ORDER — CIPROFLOXACIN HCL 500 MG PO TABS
500.0000 mg | ORAL_TABLET | Freq: Two times a day (BID) | ORAL | Status: DC
  Administered 2017-01-01 – 2017-01-02 (×2): 500 mg via ORAL
  Filled 2017-01-01 (×2): qty 1

## 2017-01-01 MED ORDER — TAMSULOSIN HCL 0.4 MG PO CAPS
0.4000 mg | ORAL_CAPSULE | Freq: Every day | ORAL | Status: DC
  Administered 2017-01-02: 0.4 mg via ORAL
  Filled 2017-01-01: qty 1

## 2017-01-01 MED ORDER — INSULIN ASPART 100 UNIT/ML ~~LOC~~ SOLN
0.0000 [IU] | Freq: Three times a day (TID) | SUBCUTANEOUS | Status: DC
  Administered 2017-01-01 – 2017-01-02 (×2): 7 [IU] via SUBCUTANEOUS
  Filled 2017-01-01 (×2): qty 1

## 2017-01-01 MED ORDER — INSULIN GLARGINE 100 UNIT/ML ~~LOC~~ SOLN
15.0000 [IU] | Freq: Two times a day (BID) | SUBCUTANEOUS | Status: DC
  Administered 2017-01-01 – 2017-01-02 (×2): 15 [IU] via SUBCUTANEOUS
  Filled 2017-01-01 (×3): qty 0.15

## 2017-01-01 MED ORDER — HEPARIN SODIUM (PORCINE) 5000 UNIT/ML IJ SOLN
5000.0000 [IU] | Freq: Three times a day (TID) | INTRAMUSCULAR | Status: DC
  Filled 2017-01-01 (×3): qty 1

## 2017-01-01 MED ORDER — HEPARIN SODIUM (PORCINE) 5000 UNIT/ML IJ SOLN: 5000.0000 [IU] | Freq: Three times a day (TID) | INTRAMUSCULAR | Status: DC

## 2017-01-01 MED ORDER — INSULIN ASPART 100 UNIT/ML ~~LOC~~ SOLN
8.0000 [IU] | Freq: Three times a day (TID) | SUBCUTANEOUS | Status: DC
  Administered 2017-01-02: 8 [IU] via SUBCUTANEOUS
  Filled 2017-01-01: qty 1

## 2017-01-01 MED ORDER — SODIUM CHLORIDE 0.9 % IV SOLN
1000.0000 mg | Freq: Once | INTRAVENOUS | Status: DC
  Filled 2017-01-01: qty 8

## 2017-01-01 MED ORDER — GABAPENTIN 100 MG PO CAPS
100.0000 mg | ORAL_CAPSULE | Freq: Three times a day (TID) | ORAL | Status: DC
  Administered 2017-01-01 – 2017-01-02 (×2): 100 mg via ORAL
  Filled 2017-01-01 (×2): qty 1

## 2017-01-01 NOTE — Progress Notes (Signed)
Received Debra [name] this am in her room with a sitter at the bedside. She communicated with this Clinical research associate and stated she ate breakfast this am. She listed the items she ate, but the sitter stated she did not eat this am. She was given orange juice after obtaining a BS of 77 and all of her insulin was held this am per doctors orders. She was compliant with her PO medications and received 8 oz of orange juice with her medications and one saltine cracker. She verbalized feeling depressed, anxious, and passive suicidal ideations related to her health and no contact from her children and significant other since her admission. She has a son who is 13 and a daughter 34. She was told about the upcoming transfer today and no objections were verbalized. Pt was transferred to the medical unit with personal belongings after report was given to  Nurse Alcario Drought.

## 2017-01-01 NOTE — Progress Notes (Signed)
Margo in Patient Placement notified patient needed bed. Requested she contact [facility] RN with bed assignment.

## 2017-01-01 NOTE — Progress Notes (Signed)
Called Dr. Amado Coe to let her know that the patient has had suicidal thoughts and needing to get orders, I also needed an order for PT eval.  She gave me a verbal order for suicide sitter, psych consult, and PT eval.

## 2017-01-01 NOTE — Progress Notes (Addendum)
[facility] -  at [facility]   PATIENT NAME: Arthur HolmsCheryl Plouffe    MR#:  [id number]  DATE OF BIRTH:  February 19, 1966  SUBJECTIVE:  CHIEF COMPLAINT:  Pt is resting comfortably, reports LE pain and requesting pain med Reports suicidal thoughts. Sitter at bedside. Has chronic MS, reports LE weakness  REVIEW OF SYSTEMS:  CONSTITUTIONAL: No fever, fatigue or weakness.  EYES: No blurred or double vision.  EARS, NOSE, AND THROAT: No tinnitus or ear pain.  RESPIRATORY: No cough, shortness of breath, wheezing or hemoptysis.  CARDIOVASCULAR: No chest pain, orthopnea, edema.  GASTROINTESTINAL: No nausea, vomiting, diarrhea or abdominal pain.  GENITOURINARY: No dysuria, hematuria.  ENDOCRINE: No polyuria, nocturia,  HEMATOLOGY: No anemia, easy bruising or bleeding SKIN: No rash or lesion. MUSCULOSKELETAL: No joint pain or arthritis.   NEUROLOGIC: No tingling, numbness, weakness.  PSYCHIATRY: No anxiety or depression.   DRUG ALLERGIES:   Allergies  Allergen Reactions  . Penicillins Anaphylaxis, Nausea And Vomiting and Rash    Has patient had a PCN reaction causing immediate rash, facial/tongue/throat swelling, SOB or lightheadedness with hypotension: Yes Has patient had a PCN reaction causing severe rash involving mucus membranes or skin necrosis: Yes Has patient had a PCN reaction that required hospitalization No Has patient had a PCN reaction occurring within the last 10 years: Yes If all of the above answers are "NO", then may proceed with Cephalosporin use.   . Pollen Extract     Seasonal Allergies  . Tape Rash    VITALS:  Blood pressure (!) 115/50, pulse 76, temperature 98.5 F (36.9 C), temperature source Oral, resp. rate 16, height 5\' 4"  (1.626 m), weight 73.4 kg (161 lb 14.4 oz), SpO2 97 %.  PHYSICAL EXAMINATION:  GENERAL:  51 y.o.-year-old patient lying in the bed with no acute distress.  EYES: Pupils equal, round, reactive to light and accommodation.  No scleral icterus. Extraocular muscles intact.  HEENT: Head atraumatic, normocephalic. Oropharynx and nasopharynx clear.  NECK:  Supple, no jugular venous distention. No thyroid enlargement, no tenderness.  LUNGS: Normal breath sounds bilaterally, no wheezing, rales,rhonchi or crepitation. No use of accessory muscles of respiration.  CARDIOVASCULAR: S1, S2 normal. No murmurs, rubs, or gallops.  ABDOMEN: Soft, nontender, nondistended. Bowel sounds present. No organomegaly or mass.  EXTREMITIES: No pedal edema, cyanosis, or clubbing.  NEUROLOGIC: Bilateral lower extremity weakness, gait not checked.  Patient is awake alert and oriented x3 PSYCHIATRIC: The patient is alert and oriented x 3.  SKIN: No obvious rash, lesion, or ulcer.    LABORATORY PANEL:   CBC No results for input(s): WBC, HGB, HCT, PLT in the last 168 hours. ------------------------------------------------------------------------------------------------------------------  Chemistries  Recent Labs  Lab 12/26/16 0711  NA 137  K 4.7  CL 103  CO2 27  GLUCOSE 194*  BUN 23*  CREATININE 1.52*  CALCIUM 9.0   ------------------------------------------------------------------------------------------------------------------  Cardiac Enzymes No results for input(s): TROPONINI in the last 168 hours. ------------------------------------------------------------------------------------------------------------------  RADIOLOGY:  Mr Laqueta JeanBrain W Wo Contrast  Result Date: 12/31/2016 CLINICAL DATA:  Recent diagnosis of multiple sclerosis without significant improvement following Solu-Medrol. EXAM: MRI HEAD WITHOUT AND WITH CONTRAST TECHNIQUE: Multiplanar, multiecho pulse sequences of the brain and surrounding structures were obtained without and with intravenous contrast. CONTRAST:  14mL MULTIHANCE GADOBENATE DIMEGLUMINE 529 MG/ML IV SOLN COMPARISON:  [facility] FINDINGS: Brain: Multiple periventricular  and subcortical T2 hyperintensities are again noted. There is diffuse involvement of the colossal septal margin. Mild restricted diffusion is  again seen. There is faint enhancement of these areas. There is prominent extension into the anterior corona radiata bilaterally. Involvement of the genu of the corpus callosum is new. There is restricted diffusion of this lesion. The lesion does not significantly enhance. No other new lesions are present. T2 changes extending into the brainstem are stable. No acute hemorrhage or mass lesion is present. The ventricles are of normal size. No significant extra-axial fluid collection is present. Vascular: Flow is present in the major intracranial arteries. Skull and upper cervical spine: The skullbase is within normal limits. Midline scratched at the craniocervical junction is normal. Midline sagittal structures are otherwise unremarkable. Sinuses/Orbits: Progressive diffuse sinus disease is present. Fluid levels are present in the maxillary sinuses and sphenoid sinuses bilaterally. Extensive mucosal thickening is present. There is diffuse opacification of ethmoid air cells. Mild mucosal thickening is present within hypoplastic frontal sinuses. The globes and orbits are within normal limits. IMPRESSION: 1. New T2 hyperintense lesion within the genu of the corpus callosum with restricted diffusion compatible with acute demyelination. 2. Multiple other periventricular T2 hyperintensities with diffuse involvement of the colossal septal margin and extension into the corona radiata bilaterally compatible with a demyelinating process. Multiple lesions demonstrate active demyelination evidence by restricted diffusion and subtle enhancement. 3. Progression of diffuse sinusitis. Electronically Signed   By: Marin Roberts M.D.   On: 12/31/2016 18:17    EKG:   Orders placed or performed during the hospital encounter of 11/15/16  . ED EKG  . ED EKG  . ED EKG  . ED EKG  . EKG  12-Lead  . EKG 12-Lead    ASSESSMENT AND PLAN:   This is a 51 year old female admitted for multiple sclerosis flare.  1. Acute on ch LE weakness - prob Multiple sclerosis exacerbation;  Reports weakness in lower extremities ,allso decreased energy, tone and visual changes. Pending neurology consult who is aware of the patient from psychiatric unit MRI brain with acute demyelination process We will give Solu-Medrol 1 g IV x1 while pending neurology consult  2.  Hypertension: controlled; continue amlodipine Not on hydralazine, HCTZ, metoprolol and lisinopri at this time  3.  Diabetes mellitus type 1: With neuropathy.   Continue gabapentin for neuropathy Insulin sliding scale and NovoLog 8 units 3 times daily with each meal Lantus 15 units twice daily  4.  UTI: Present on admission; continue Cipro, bethanechol and tamsulosin  5.  Depression: Severe; continue Cymbalta and Risperdal, still having suicidal thoughts One-on-one observation for safety, follow-up with Dr. Toni Amend  6.  DVT prophylaxis: Heparin  7.  GI prophylaxis: Pantoprazole per home regimen     All the records are reviewed and case discussed with Care Management/Social Workerr. Management plans discussed with the patient, family and they are in agreement.  CODE STATUS: fc  TOTAL TIME TAKING CARE OF THIS PATIENT: 36 minutes.   POSSIBLE D/C IN 2 DAYS, DEPENDING ON CLINICAL CONDITION.  Note: This dictation was prepared with Dragon dictation along with smaller phrase technology. Any transcriptional errors that result from this process are unintentional.   Ramonita Lab M.D on 01/01/2017 at 8:06 PM  Between 7am to 6pm - Pager - [phone] After 6pm go to [email or web address] - password [email or web address] [facility]  [facility]  Office  [phone]  CC: Primary care physician; Nyra Market, MD

## 2017-01-01 NOTE — Discharge Summary (Addendum)
Physician Discharge Summary Note  Patient:  Debra [name] is an 51 y.o., female MRN:  [id number] DOB:  [birth year] Patient phone:  [phone] (home)  Patient address:   [address] [place] [address],  Total Time spent with patient: 30 minutes  Date of Admission:  12/22/2016 Date of Discharge: 01/01/2017  Reason for Admission: Suicidal ideation.  History of Present Illness: " I have MS , I need walker, cant' drive" Debra [name] a 51 y.o.femalepatient referred by Lydia Guiles, admitted voluntarily. Pt admitted with suicidal ideation with plan to OD on rx meds  in the setting of multiple stressors including recent diagnosis of multiple sclerosis. Pt reports that she  has a long history of depression ( more than 10 yrs) , getting worse for last 1 month with suicidal ideation . She is currently not taking any medications for her mood, although hx of tx with prozac and cymbalta. She reports worsening of her mood over the past month due to recent diagnosis of multiple sclerosis, needing walker to ambulate, ex boyfriend unable to take care of her, cannot go back with her ex boyfriend upon d/c, pending homelessness. . She reports worsening SI. She feels hopeless,  lack of support. She is unable to return to her previous house where she lived with her ex boyfriend and her daughter is unable to have her at her home. She is unable to work or drive anymore due to leg weakness. She was recently admitted to the inpatient rehab service (9/26-10/19).  She denies substance use. She denies AVH, paranoia.  Pt is depressed, decreased PMA, poor eye contact, rating is 10/10 depression. Endorses anhedonia, poor sleep and appetite. Admits  thoughts of SI by overdosing on her meds, but contracts for safety.   Associated Signs/Symptoms: Depression Symptoms:  depressed mood, anhedonia, insomnia, hopelessness, suicidal thoughts with specific plan, (Hypo) Manic Symptoms:   Anxiety  Symptoms:  worries Psychotic Symptoms:  denies PTSD Symptoms:  Past Psychiatric History:  chronic depression  Family Psychiatric  History: denies  Social History: Unable to work, no benefits, no family support.  Principal Problem: Major depressive disorder, recurrent, severe with psychotic features [facility]) Discharge Diagnoses: Patient Active Problem List   Diagnosis Date Noted  . Dysthymia [F34.1] 12/27/2016  . Major depressive disorder, recurrent, severe with psychotic features (HCC) [F33.3]   . Multiple joint pain [M25.50]   . HTN (hypertension) [I10]   . Acute pain of left knee [M25.562]   . Neurogenic bladder [N31.9]   . Acute lower UTI [N39.0]   . Labile blood pressure [R09.89]   . Acute blood loss anemia [D62]   . Leukocytosis [D72.829]   . Labile blood glucose [R73.09]   . Hypertensive crisis [I16.9]   . Hypoglycemia [E16.2]   . Poorly controlled type 2 diabetes mellitus with peripheral neuropathy (HCC) [E11.42, E11.65]   . Uncontrolled hypertension [I10]   . Sleep disturbance [G47.9]   . Multiple sclerosis (HCC) [G35] 11/22/2016  . Thoracic root lesion [G54.3] 11/22/2016  . MS (multiple sclerosis) (HCC) [G35]   . Neuropathic pain [M79.2]   . Uncontrolled type 2 diabetes mellitus with peripheral neuropathy (HCC) [E11.42, E11.65]   . Urinary retention [R33.9]   . Medically noncompliant [Z91.19]   . Leg weakness, bilateral [R29.898] 11/20/2016  . Diabetic ketoacidosis without coma associated with type 1 diabetes mellitus (HCC) [E10.10]   . Leg pain [M79.606]   . History of CVA (cerebrovascular accident) [Z86.73]   . Uncontrolled type 1 diabetes  mellitus with diabetic peripheral neuropathy (HCC) [E10.42, E10.65]   . Benign essential HTN [I10]   . Hypertensive urgency [I16.0]   . AKI (acute kidney injury) (HCC) [N17.9]   . Fibromyalgia [M79.7] 11/07/2016  . Back pain [M54.9] 10/31/2016  . Stable angina pectoris (HCC) [I20.8]   . Vitamin D deficiency [E55.9]  08/31/2014  . 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 Hyperlipidemia [E78.5] 10/06/2013  . Depression [F32.9] 10/16/2012  . Bilateral knee pain [M25.561, M25.562] 02/16/2012  . Chronic diarrhea [K52.9] 10/19/2010  . Peripheral neuropathy [G62.9] 08/19/2010  . Type 1 diabetes mellitus (HCC) [E10.9] 07/06/2010  . Hypertension [I10] 07/06/2010  . Asthma [J45.909] 07/06/2010  . Migraine [G43.909] 07/06/2010   Past Medical History:  Past Medical History:  Diagnosis Date  . Adenomatous colonic polyps   . Anemia    2005  . Anxiety    1990  . Arthritis   . Asthma    2000  . Cataract   . Depression 1990  . Difficult intubation    narrow airway  . Gastroesophageal Reflux Disease (GERD)   . Heart murmur    Birth  . Hyperlipidemia    2005  . Hypertension    1998  . Internal hemorrhoids 04/24/06   on colonoscopy  . Migraine   . Neuropathy of the hands & feet   . Restless Leg Syndrome   . Right Ankle Fracture 10/06/2013  . Sleep paralysis   . Stable angina pectoris    2007: cath showing normal cors.   . Stroke 1990  . Type I Diabetes Mellitus 1988    Past Surgical History:  Procedure Laterality Date  . bilateral foot surgery    . BREAST SURGERY Left    biopsy left breast  . CARDIAC CATHETERIZATION  2007   around 2007 or 2008  . CESAREAN SECTION    . CHOLECYSTECTOMY    . COLONOSCOPY W/ BIOPSIES AND POLYPECTOMY    . ENDOMETRIAL ABLATION W/ NOVASURE N/A 12/2009  . EYE SURGERY    . TRIGGER FINGER RELEASE     x 3   Family History:  Family History  Problem Relation Age of Onset  . Hypertension Mother   . Kidney disease Mother   . Hypertension Father   . Breast cancer Maternal Grandmother   . Prostate cancer Maternal Grandfather   . Ovarian cancer Paternal Grandmother   . Prostate cancer Paternal Grandfather   . Colon cancer Maternal Uncle        Family history of malignant neoplasm of gastrointestinal tract   Social History:   Social History   Substance and Sexual Activity  Alcohol Use No  . Alcohol/week: 0.0 oz     Social History   Substance and Sexual Activity  Drug Use No   Comment: drug addict    Social History   Socioeconomic History  . Marital status: Divorced    Spouse name: None  . Number of children: 2  . Years of education: 57  . Highest education level: None  Social Needs  . Financial resource strain: None  . Food insecurity - worry: None  . Food insecurity - inability: None  . Transportation needs - medical: None  . Transportation needs - non-medical: None  Occupational History  . Occupation: Unemployed  Tobacco Use  . Smoking status: Never Smoker  . Smokeless tobacco: Never Used  Substance and Sexual Activity  . Alcohol use: No    Alcohol/week: 0.0 oz  . Drug  use: No    Comment: drug addict  . Sexual activity: Yes    Birth control/protection: Other-see comments    Comment: pt states she had ablation in 2011  Other Topics Concern  . None  Social History Narrative   Lost medicaid about 2009 ish when her youngest child turned 66. Has adult children. Lives with boyfriend who financially supports her. Has attempted to get disability but has been turned down.      2-3 caffeine drinks daily     Hospital Course:    51 yo female with history of depression and suicidal thoughts with plan to overdose on medication in the context of multiple psychosocial stressors and medical comorbidities. She has chronic depression and suicidal thoughts currently exacerbated by homelessness and recent diagnosis of MS. Suicidal ideation and hallucinations resolved but the patient still depressed and hopeless.  1. MDD with psychosis -Cymbalta 40 mg daily -Risperdal 0.5 mg qhs for hallucinations (now resolved)  2. HTN -Patient treated with multiple antihypertensives per Medicine recommendation --HCTZ 25 mg daily, Lisinopril 20 mg BID, Metoprolol 100 mg daily, Norvasc 10 mg daily, and Hydralazine  25 mg q8hrs -Blood pressure low today, holding antihypertensives  3. UTI -Cipro500 mg BID for 7 days  4. MS -Seen by Neurology and no intervention while in the hospital was recommended initially -PT continued to work with patient -new weakness developed on 11/4 -New MRI obtained 12/31/2016 shows new lesions -Neurology consult. Dr. Thad Ranger recommends plasmapheresis.   5. Type I Diabetes -Diabetes care coordinators on board to assist with insulin. Recommended increase Lantus to 24 units qhs, increase aspart to 10 units TID with meals, and sliding scale. aspart 2 units at bedtime if she eats snack -We gave 15 units of lantus last night due to poor oral intake   6. Urinary retention/incontince -Continue Flomax 0.8 mg qhs and urecholine TID  7. Social -Pt is newly homeless as her boyfriend can no longer take care of her. She has 2 adult children that she identifies as her support system but they are unable to have her live with them with home health. APS is involved with dispo. Pt requires 24/7 supervision (per previous documentation) and would requireSNF/ALF upon discharge. She is not appropriate to discharge to shelter given many medical comorbiditiesand extensive weakness.   8. Dispo.  -Transfer to medical floor for further treatment     Physical Findings: AIMS: Facial and Oral Movements Muscles of Facial Expression: None, normal Lips and Perioral Area: None, normal Jaw: None, normal Tongue: None, normal,Extremity Movements Upper (arms, wrists, hands, fingers): None, normal Lower (legs, knees, ankles, toes): None, normal,  , Overall Severity Severity of abnormal movements (highest score from questions above): None, normal Incapacitation due to abnormal movements: None, normal Patient's awareness of abnormal movements (rate only patient's report): No Awareness, Dental Status Current problems with teeth and/or dentures?: No Does patient usually wear dentures?: No  CIWA:     COWS:     Musculoskeletal: Strength & Muscle Tone: decreased Gait & Station: unable to stand Patient leans: N/A  Psychiatric Specialty Exam: Physical Exam  Nursing note and vitals reviewed. Psychiatric: Judgment and thought content normal. Her speech is delayed. She is withdrawn. Cognition and memory are normal. She exhibits a depressed mood.    Review of Systems  Constitutional: Positive for fever and malaise/fatigue.  Eyes: Positive for blurred vision and double vision.  Gastrointestinal: Positive for diarrhea.  Genitourinary: Positive for dysuria.  Neurological: Positive for focal weakness and weakness.  Psychiatric/Behavioral:  Positive for depression.  All other systems reviewed and are negative.   Blood pressure (!) 108/57, pulse 80, temperature 99.9 F (37.7 C), temperature source Oral, resp. rate 16, height 5\' 4"  (1.626 m), weight 68 kg (150 lb), SpO2 99 %.Body mass index is 25.75 kg/m.  General Appearance: Casual  Eye Contact:  Good  Speech:  Slow  Volume:  Decreased  Mood:  Depressed and Hopeless  Affect:  Blunt  Thought Process:  Goal Directed and Descriptions of Associations: Intact  Orientation:  Full (Time, Place, and Person)  Thought Content:  WDL  Suicidal Thoughts:  No  Homicidal Thoughts:  No  Memory:  Immediate;   Fair Recent;   Fair Remote;   Fair  Judgement:  Impaired  Insight:  Shallow  Psychomotor Activity:  Psychomotor Retardation  Concentration:  Concentration: Poor and Attention Span: Poor  Recall:  Poor  Fund of Knowledge:  Fair  Language:  Fair  Akathisia:  No  Handed:  Right  AIMS (if indicated):     Assets:  Communication Skills Desire for Improvement Resilience  ADL's:  Intact  Cognition:  WNL  Sleep:  Number of Hours: 7.45     Have you used any form of tobacco in the last 30 days? (Cigarettes, Smokeless Tobacco, Cigars, and/or Pipes): No  Has this patient used any form of tobacco in the last 30 days? (Cigarettes, Smokeless  Tobacco, Cigars, and/or Pipes) Yes, No  Blood Alcohol level:  Lab Results  Component Value Date   ETH <10 12/20/2016   ETH  12/09/2006    <5        LOWEST DETECTABLE LIMIT FOR SERUM ALCOHOL IS 11 mg/dL FOR MEDICAL PURPOSES ONLY    Metabolic Disorder Labs:  Lab Results  Component Value Date   HGBA1C 11.4 (H) 12/23/2016   MPG 280.48 12/23/2016   No results found for: PROLACTIN Lab Results  Component Value Date   CHOL 105 12/23/2016   TRIG 103 12/23/2016   HDL 46 12/23/2016   CHOLHDL 2.3 12/23/2016   VLDL 21 12/23/2016   LDLCALC 38 12/23/2016   LDLCALC 112 (H) 04/21/2015    See Psychiatric Specialty Exam and Suicide Risk Assessment completed by Attending Physician prior to discharge.  Discharge destination:  Other:  medical floor  Is patient on multiple antipsychotic therapies at discharge:  No   Has Patient had three or more failed trials of antipsychotic monotherapy by history:  No  Recommended Plan for Multiple Antipsychotic Therapies: NA  Discharge Instructions    Diet - low sodium heart healthy   Complete by:  As directed    Increase activity slowly   Complete by:  As directed      Allergies as of 01/01/2017      Reactions   Penicillins Anaphylaxis, Nausea And Vomiting, Rash   Has patient had a PCN reaction causing immediate rash, facial/tongue/throat swelling, SOB or lightheadedness with hypotension: Yes Has patient had a PCN reaction causing severe rash involving mucus membranes or skin necrosis: Yes Has patient had a PCN reaction that required hospitalization No Has patient had a PCN reaction occurring within the last 10 years: Yes If all of the above answers are "NO", then may proceed with Cephalosporin use.   Pollen Extract    Seasonal Allergies   Tape Rash      Medication List    STOP taking these medications   amLODipine 10 MG tablet Commonly known as:  NORVASC   aspirin EC 81 MG tablet  atorvastatin 40 MG tablet Commonly known as:   LIPITOR   bethanechol 25 MG tablet Commonly known as:  URECHOLINE   bethanechol 50 MG tablet Commonly known as:  URECHOLINE   gabapentin 300 MG capsule Commonly known as:  NEURONTIN   hydrALAZINE 25 MG tablet Commonly known as:  APRESOLINE   insulin aspart 100 UNIT/ML FlexPen Commonly known as:  NOVOLOG FLEXPEN   lisinopril 20 MG tablet Commonly known as:  PRINIVIL,ZESTRIL   Meloxicam 10 MG Caps   metoprolol succinate 25 MG 24 hr tablet Commonly known as:  TOPROL-XL   NITROSTAT 0.4 MG SL tablet Generic drug:  nitroGLYCERIN   Oxycodone HCl 10 MG Tabs   pantoprazole 40 MG tablet Commonly known as:  PROTONIX   PROVENTIL HFA 108 (90 Base) MCG/ACT inhaler Generic drug:  albuterol   tamsulosin 0.4 MG Caps capsule Commonly known as:  FLOMAX   traZODone 50 MG tablet Commonly known as:  DESYREL     TAKE these medications     Indication  AGAMATRIX PRESTO PRO METER Devi The patient is insulin requiring, ICD 10 code E10.9. The patient tests 4 times per day.  Indication:  diabetes monitoring   glucose blood test strip Commonly known as:  AGAMATRIX AMP TEST Use as instructed  Indication:  clucose monitoring   insulin glargine 100 unit/mL Sopn Commonly known as:  LANTUS Inject 0.17 mLs (17 Units total) into the skin 2 (two) times daily. What changed:  Another medication with the same name was removed. Continue taking this medication, and follow the directions you see here.  Indication:  Insulin-Dependent Diabetes      Follow-up Information    [facility] up.   Specialty:  [facility] information: [address] [place] [address] [phone]           Follow-up recommendations:  Activity:  as tolerated Diet:  low sodiumheart healthy ADA diet Other:  keep follow up appointments  Comments:    Signed: Kristine Linea, MD 01/01/2017, 5:38 AM

## 2017-01-01 NOTE — H&P (Signed)
Debra [name] is an 51 y.o. female.   Chief Complaint: Difficulty walking HPI: The patient with past medical history of depression, MS, hypertension, diabetes type 1, and recurrent UTI presented to the psychiatry unit as a transfer due to major depressive episode.  At the time of admission the patient was having difficulty walking but was able to ambulate with a rolling walker.  But now she is barely able to stand.  This is contributed to her depression.  MRI of her brain shows new lesions consistent with multiple sclerosis.  Now the patient reportedly has floaters as well as central scotoma in her vision.  She is unable to significantly verbalize her concerns and complaints at this time although she is able to follow commands to some degree.  Psychiatry called the hospitalist service to assume care as her condition has progressed beyond her psychiatric needs.  Past Medical History:  Diagnosis Date  . Adenomatous colonic polyps   . Anemia    2005  . Anxiety    1990  . Arthritis   . Asthma    2000  . Cataract   . Depression 1990  . Difficult intubation    narrow airway  . Gastroesophageal Reflux Disease (GERD)   . Heart murmur    Birth  . Hyperlipidemia    2005  . Hypertension    1998  . Internal hemorrhoids 04/24/06   on colonoscopy  . Migraine   . Neuropathy of the hands & feet   . Restless Leg Syndrome   . Right Ankle Fracture 10/06/2013  . Sleep paralysis   . Stable angina pectoris    2007: cath showing normal cors.   . Stroke 1990  . Type I Diabetes Mellitus 1988    Past Surgical History:  Procedure Laterality Date  . bilateral foot surgery    . BREAST SURGERY Left    biopsy left breast  . CARDIAC CATHETERIZATION  2007   around 2007 or 2008  . CESAREAN SECTION    . CHOLECYSTECTOMY    . COLONOSCOPY W/ BIOPSIES AND POLYPECTOMY    . ENDOMETRIAL ABLATION W/ NOVASURE N/A 12/2009  . EYE SURGERY    . TRIGGER FINGER RELEASE     x 3    Family History  Problem  Relation Age of Onset  . Hypertension Mother   . Kidney disease Mother   . Hypertension Father   . Breast cancer Maternal Grandmother   . Prostate cancer Maternal Grandfather   . Ovarian cancer Paternal Grandmother   . Prostate cancer Paternal Grandfather   . Colon cancer Maternal Uncle        Family history of malignant neoplasm of gastrointestinal tract   Social History:  reports that  has never smoked. she has never used smokeless tobacco. She reports that she does not drink alcohol or use drugs.  Allergies:  Allergies  Allergen Reactions  . Penicillins Anaphylaxis, Nausea And Vomiting and Rash    Has patient had a PCN reaction causing immediate rash, facial/tongue/throat swelling, SOB or lightheadedness with hypotension: Yes Has patient had a PCN reaction causing severe rash involving mucus membranes or skin necrosis: Yes Has patient had a PCN reaction that required hospitalization No Has patient had a PCN reaction occurring within the last 10 years: Yes If all of the above answers are "NO", then may proceed with Cephalosporin use.   . Pollen Extract     Seasonal Allergies  . Tape Rash    Medications Prior to Admission  Medication Sig Dispense Refill  . amLODipine (NORVASC) 10 MG tablet Take 1 tablet (10 mg total) by mouth daily. 90 tablet 1  . aspirin EC 81 MG tablet Take 1 tablet (81 mg total) by mouth daily. 90 tablet 3  . atorvastatin (LIPITOR) 40 MG tablet Take 1 tablet (40 mg total) by mouth daily. 90 tablet 3  . bethanechol (URECHOLINE) 25 MG tablet Take 50 mg by mouth 3 (three) times daily.  0  . bethanechol (URECHOLINE) 50 MG tablet Take 1 tablet (50 mg total) by mouth 3 (three) times daily. 30 tablet 0  . 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 The patient tests 4 times per day. 1 Device 0  . gabapentin (NEURONTIN) 300 MG capsule Take 2 capsules (600 mg total) by mouth 3 (three) times  daily. 180 capsule 0  . glucose blood (AGAMATRIX AMP TEST) test strip Use as instructed 100 each 12  . hydrALAZINE (APRESOLINE) 25 MG tablet Take 3 tablets (75 mg total) by mouth every 8 (eight) hours. 90 tablet 0  . insulin aspart (NOVOLOG FLEXPEN) 100 UNIT/ML FlexPen Inject 5 Units into the skin 3 (three) times daily with meals. (Patient taking differently: Inject 0-10 Units into the skin 3 (three) times daily with meals. ) 15 mL 11  . insulin glargine (LANTUS) 100 unit/mL SOPN Inject 0.17 mLs (17 Units total) into the skin 2 (two) times daily. 15 mL 11  . insulin glargine (LANTUS) 100 unit/mL SOPN Inject 0.17 mLs (17 Units total) into the skin 2 (two) times daily. 15 mL 11  . lisinopril (PRINIVIL,ZESTRIL) 20 MG tablet Take 1 tablet (20 mg total) by mouth 2 (two) times daily. 60 tablet 0  . Meloxicam 10 MG CAPS Take 15 mg by mouth every morning. 30 capsule 0  . metoprolol succinate (TOPROL-XL) 25 MG 24 hr tablet Take 3 tablets (75 mg total) by mouth daily. 30 tablet 0  . NITROSTAT 0.4 MG SL tablet PLACE 1 TABLET UNDER TONGUE AS NEEDED FOR CHEST PAIN EVERY 5 MINUTES X 3 MAX DOSES.  CALL 911 IF PAIN PERSISTS  0  . oxyCODONE 10 MG TABS Take 1 tablet (10 mg total) by mouth every 4 (four) hours as needed for moderate pain. 20 tablet 0  . pantoprazole (PROTONIX) 40 MG tablet Take 1 tablet (40 mg total) by mouth daily. 30 tablet 0  . PROVENTIL HFA 108 (90 Base) MCG/ACT inhaler INHALE 1 TO 2 PUFFS BY MOUTH EVERY 6 HOURS AS NEEDED FOR COUGHING, WHEEZING, OR SHORTNESS OF BREATH  0  . tamsulosin (FLOMAX) 0.4 MG CAPS capsule Take 2 capsules (0.8 mg total) by mouth daily after breakfast. 30 capsule 0  . traZODone (DESYREL) 50 MG tablet Take 1 tablet (50 mg total) by mouth at bedtime. 30 tablet 0    Results for orders placed or performed during the hospital encounter of 12/22/16 (from the past 48 hour(s))  Glucose, capillary     Status: Abnormal   Collection Time: 12/30/16  6:58 AM  Result Value Ref Range    Glucose-Capillary 133 (H) 65 - 99 mg/dL  Glucose, capillary     Status: Abnormal   Collection Time: 12/30/16 11:40 AM  Result Value Ref Range   Glucose-Capillary 232 (H) 65 - 99 mg/dL  Glucose, capillary     Status: Abnormal   Collection Time: 12/30/16  4:30 PM  Result Value Ref Range   Glucose-Capillary 132 (H) 65 - 99  mg/dL  Glucose, capillary     Status: None   Collection Time: 12/30/16  9:07 PM  Result Value Ref Range   Glucose-Capillary 98 65 - 99 mg/dL  Glucose, capillary     Status: Abnormal   Collection Time: 12/31/16  6:54 AM  Result Value Ref Range   Glucose-Capillary 133 (H) 65 - 99 mg/dL  Glucose, capillary     Status: None   Collection Time: 12/31/16 11:27 AM  Result Value Ref Range   Glucose-Capillary 90 65 - 99 mg/dL   Comment 1 Document in Chart   Glucose, capillary     Status: Abnormal   Collection Time: 12/31/16  4:26 PM  Result Value Ref Range   Glucose-Capillary 174 (H) 65 - 99 mg/dL  Glucose, capillary     Status: Abnormal   Collection Time: 12/31/16  8:59 PM  Result Value Ref Range   Glucose-Capillary 64 (L) 65 - 99 mg/dL  Glucose, capillary     Status: None   Collection Time: 12/31/16  9:40 PM  Result Value Ref Range   Glucose-Capillary 92 65 - 99 mg/dL   Comment 1 Notify RN    Comment 2 Document in Chart   Glucose, capillary     Status: Abnormal   Collection Time: 12/31/16 10:57 PM  Result Value Ref Range   Glucose-Capillary 148 (H) 65 - 99 mg/dL  Glucose, capillary     Status: Abnormal   Collection Time: 01/01/17  4:19 AM  Result Value Ref Range   Glucose-Capillary 105 (H) 65 - 99 mg/dL   Mr Laqueta Jean Wo Contrast  Result Date: 12/31/2016 CLINICAL DATA:  Recent diagnosis of multiple sclerosis without significant improvement following Solu-Medrol. EXAM: MRI HEAD WITHOUT AND WITH CONTRAST TECHNIQUE: Multiplanar, multiecho pulse sequences of the brain and surrounding structures were obtained without and with intravenous contrast. CONTRAST:  14mL  MULTIHANCE GADOBENATE DIMEGLUMINE 529 MG/ML IV SOLN COMPARISON:  MRI brain 11/21/2016 at [facility]. FINDINGS: Brain: Multiple periventricular and subcortical T2 hyperintensities are again noted. There is diffuse involvement of the colossal septal margin. Mild restricted diffusion is again seen. There is faint enhancement of these areas. There is prominent extension into the anterior corona radiata bilaterally. Involvement of the genu of the corpus callosum is new. There is restricted diffusion of this lesion. The lesion does not significantly enhance. No other new lesions are present. T2 changes extending into the brainstem are stable. No acute hemorrhage or mass lesion is present. The ventricles are of normal size. No significant extra-axial fluid collection is present. Vascular: Flow is present in the major intracranial arteries. Skull and upper cervical spine: The skullbase is within normal limits. Midline scratched at the craniocervical junction is normal. Midline sagittal structures are otherwise unremarkable. Sinuses/Orbits: Progressive diffuse sinus disease is present. Fluid levels are present in the maxillary sinuses and sphenoid sinuses bilaterally. Extensive mucosal thickening is present. There is diffuse opacification of ethmoid air cells. Mild mucosal thickening is present within hypoplastic frontal sinuses. The globes and orbits are within normal limits. IMPRESSION: 1. New T2 hyperintense lesion within the genu of the corpus callosum with restricted diffusion compatible with acute demyelination. 2. Multiple other periventricular T2 hyperintensities with diffuse involvement of the colossal septal margin and extension into the corona radiata bilaterally compatible with a demyelinating process. Multiple lesions demonstrate active demyelination evidence by restricted diffusion and subtle enhancement. 3. Progression of diffuse sinusitis. Electronically Signed   By: Marin Roberts M.D.   On:  12/31/2016 18:17  Review of Systems  Unable to perform ROS: Mental status change    Blood pressure (!) 108/57, pulse 80, temperature 99.9 F (37.7 C), temperature source Oral, resp. rate 16, height 5\' 4"  (1.626 m), weight 68 kg (150 lb), SpO2 99 %. Physical Exam  Vitals reviewed. Constitutional: She is oriented to person, place, and time. She appears well-developed and well-nourished. She appears lethargic. No distress.  HENT:  Head: Normocephalic and atraumatic.  Mouth/Throat: Oropharynx is clear and moist.  Eyes: Conjunctivae and EOM are normal. Pupils are equal, round, and reactive to light. No scleral icterus.  Neck: Normal range of motion. Neck supple. No JVD present. No tracheal deviation present. No thyromegaly present.  Cardiovascular: Normal rate, regular rhythm and normal heart sounds. Exam reveals no gallop and no friction rub.  No murmur heard. Respiratory: Effort normal and breath sounds normal.  GI: Soft. Bowel sounds are normal. She exhibits no distension. There is no tenderness.  Genitourinary:  Genitourinary Comments: Deferred  Musculoskeletal: Normal range of motion. She exhibits no edema.  Lymphadenopathy:    She has no cervical adenopathy.  Neurological: She is oriented to person, place, and time. She appears lethargic. No cranial nerve deficit or sensory deficit. She exhibits normal muscle tone.  Skin: Skin is warm and dry. No rash noted. No erythema.  Psychiatric: Her affect is blunt. She is slowed. She expresses suicidal (per report) ideation. She is noncommunicative.     Assessment/Plan This is a 51 year old female admitted for multiple sclerosis flare. 1.  Multiple sclerosis: Exacerbation; weakness in lower extremities.  Also decreased energy, tone and visual changes.  I have consulted neurology who is aware of the patient from psychiatric unit. 2.  Hypertension: Relatively controlled; continue amlodipine, hydralazine, HIDA chlorothiazide, metoprolol and  lisinopril. 3.  Diabetes mellitus type 1: With neuropathy.  Continue prandial insulin as well as.  Currently the patient's blood sugar is slightly low.  She is able to drink orange juice.  Check blood sugar in 1 hour.  Continue Cymbalta and gabapentin for neuropathy 4.  UTI: Present on admission; continue Cipro, bethanechol and tamsulosin 5.  Depression: Severe; continue trazodone and Risperdal 6.  DVT prophylaxis: Heparin 7.  GI prophylaxis: Pantoprazole per home regimen The patient is a full code.  Time spent on admission orders and patient care proximally 45 minutes  Arnaldo Natal, MD 01/01/2017, 5:58 AM

## 2017-01-01 NOTE — Progress Notes (Addendum)
Chaplain responded to an OR for a pt whom RN stated could benefit from a chaplain's visit. [place] met pt and a nurse-aid at bedside. Pt was asleep at the time of this visit so the chaplain was not able to speak to the pt. [place] to follow up pt as needed.

## 2017-01-01 NOTE — Progress Notes (Addendum)
Inpatient Diabetes Program Recommendations  AACE/ADA: New Consensus Statement on Inpatient Glycemic Control (2015)  Target Ranges:  Prepandial:   less than 140 mg/dL      Peak postprandial:   less than 180 mg/dL (1-2 hours)      Critically ill patients:  140 - 180 mg/dL    Results for Debra [name], Debra [name] (MRN [id number]) as of 01/01/2017 14:22  Ref. Range 12/31/2016 06:54 12/31/2016 11:27 12/31/2016 16:26 12/31/2016 20:59 12/31/2016 21:40 12/31/2016 22:57  Glucose-Capillary Latest Ref Range: 65 - 99 mg/dL 038 (H) 90 882 (H) 64 (L) 92 148 (H)   Results for Debra [name], Debra [name] (MRN [id number]) as of 01/01/2017 14:22  Ref. Range 01/01/2017 07:00 01/01/2017 13:16  Glucose-Capillary Latest Ref Range: 65 - 99 mg/dL 77 150 (H)    Home DM Meds: Lantus 17 units BID       Novolog 0-10 units TID  Current Insulin Orders: Novolog Sensitive Correction Scale/ SSI (0-9 units) TID AC + HS       MD- Note patient transferred to Med-Surg floor from Psych unit this AM.  Downstairs, patient was receiving Lantus 24 units BID + Novolog Sensitive Correction Scale/ SSI (0-9 units) TID AC + HS + Novolog 10 units TID for meal coverage.  Note patient with Hypoglycemia last night at bedtime, so Lantus was reduced to 15 units  BID this AM by Pysch MD.  Please consider the following:  1. Restart Lantus 15 units BID  2. Start Novolog Meal Coverage: Novolog 8 units TID with meals (hold if pt eats <50% of meal)       --Will follow patient during hospitalization--  Ambrose Finland RN, MSN, CDE Diabetes Coordinator Inpatient Glycemic Control Team Team Pager: [phone] (8a-5p)

## 2017-01-01 NOTE — Progress Notes (Addendum)
Subjective: Patient reports that her vision became worse on yesterday.  Although she can see colors and count fingers, she can not read.  She also reports that she can  Not stand.  Was unable to feed herself.  Objective: Current vital signs: BP (!) 115/50 (BP Location: Right Arm)   Pulse 76   Temp 98.5 F (36.9 C) (Oral)   Resp 16   Ht 5\' 4"  (1.626 m)   Wt 73.4 kg (161 lb 14.4 oz)   SpO2 97%   BMI 27.79 kg/m  Vital signs in last 24 hours: Temp:  [98.5 F (36.9 C)-99.9 F (37.7 C)] 98.5 F (36.9 C) (11/05 1956) Pulse Rate:  [76-82] 76 (11/05 1956) Resp:  [16] 16 (11/05 1956) BP: (108-145)/(50-66) 115/50 (11/05 1956) SpO2:  [95 %-97 %] 97 % (11/05 1956) Weight:  [73.4 kg (161 lb 14.4 oz)] 73.4 kg (161 lb 14.4 oz) (11/05 1500)  Intake/Output from previous day: No intake/output data recorded. Intake/Output this shift: Total I/O In: 240 [P.O.:240] Out: 0  Nutritional status: Diet Carb Modified Fluid consistency: Thin; Room service appropriate? Yes  Neurologic Exam: Mental Status: Alert, oriented, thought content appropriate.  Speech fluent without evidence of aphasia.  Able to follow 3 step commands without difficulty. Cranial Nerves: II: Counts fingers can report colors, pupils equal, round, reactive to light and accommodation III,IV, VI: ptosis not present, extra-ocular motions intact bilaterally V,VII: smile symmetric, facial light touch sensation normal bilaterally VIII: hearing normal bilaterally IX,X: gag reflex present XI: bilateral shoulder shrug XII: midline tongue extension Motor: 5-/5 proximally in the BUE's with 3/5 hand grip bilaterally.  3-/5 in the BLE's   Lab Results: Basic Metabolic Panel: Recent Labs  Lab 12/26/16 0711  NA 137  K 4.7  CL 103  CO2 27  GLUCOSE 194*  BUN 23*  CREATININE 1.52*  CALCIUM 9.0    Liver Function Tests: No results for input(s): AST, ALT, ALKPHOS, BILITOT, PROT, ALBUMIN in the last 168 hours. No results for  input(s): LIPASE, AMYLASE in the last 168 hours. No results for input(s): AMMONIA in the last 168 hours.  CBC: No results for input(s): WBC, NEUTROABS, HGB, HCT, MCV, PLT in the last 168 hours.  Cardiac Enzymes: Recent Labs  Lab 12/26/16 0711  CKTOTAL 121    Lipid Panel: No results for input(s): CHOL, TRIG, HDL, CHOLHDL, VLDL, LDLCALC in the last 168 hours.  CBG: Recent Labs  Lab 12/31/16 2140 12/31/16 2257 01/01/17 0419 01/01/17 0700 01/01/17 1316  GLUCAP 92 148* 105* 77 211*    Microbiology: Results for orders placed or performed during the hospital encounter of 12/22/16  Urine Culture     Status: Abnormal   Collection Time: 12/24/16 12:28 AM  Result Value Ref Range Status   Specimen Description URINE, CLEAN CATCH  Final   Special Requests NONE  Final   Culture >=100,000 COLONIES/mL KLEBSIELLA PNEUMONIAE (A)  Final   Report Status 12/26/2016 FINAL  Final   Organism ID, Bacteria KLEBSIELLA PNEUMONIAE (A)  Final      Susceptibility   Klebsiella pneumoniae - MIC*    AMPICILLIN >=32 RESISTANT Resistant     CEFAZOLIN <=4 SENSITIVE Sensitive     CEFTRIAXONE <=1 SENSITIVE Sensitive     CIPROFLOXACIN 1 SENSITIVE Sensitive     GENTAMICIN <=1 SENSITIVE Sensitive     IMIPENEM <=0.25 SENSITIVE Sensitive     NITROFURANTOIN 128 RESISTANT Resistant     TRIMETH/SULFA <=20 SENSITIVE Sensitive     AMPICILLIN/SULBACTAM 8 SENSITIVE Sensitive  PIP/TAZO 8 SENSITIVE Sensitive     Extended ESBL NEGATIVE Sensitive     * >=100,000 COLONIES/mL KLEBSIELLA PNEUMONIAE    Coagulation Studies: No results for input(s): LABPROT, INR in the last 72 hours.  Imaging: Mr Laqueta Jean BP Contrast  Result Date: 12/31/2016 CLINICAL DATA:  Recent diagnosis of multiple sclerosis without significant improvement following Solu-Medrol. EXAM: MRI HEAD WITHOUT AND WITH CONTRAST TECHNIQUE: Multiplanar, multiecho pulse sequences of the brain and surrounding structures were obtained without and with  intravenous contrast. CONTRAST:  40mL MULTIHANCE GADOBENATE DIMEGLUMINE 529 MG/ML IV SOLN COMPARISON:  MRI brain 11/21/2016 at [facility]. FINDINGS: Brain: Multiple periventricular and subcortical T2 hyperintensities are again noted. There is diffuse involvement of the colossal septal margin. Mild restricted diffusion is again seen. There is faint enhancement of these areas. There is prominent extension into the anterior corona radiata bilaterally. Involvement of the genu of the corpus callosum is new. There is restricted diffusion of this lesion. The lesion does not significantly enhance. No other new lesions are present. T2 changes extending into the brainstem are stable. No acute hemorrhage or mass lesion is present. The ventricles are of normal size. No significant extra-axial fluid collection is present. Vascular: Flow is present in the major intracranial arteries. Skull and upper cervical spine: The skullbase is within normal limits. Midline scratched at the craniocervical junction is normal. Midline sagittal structures are otherwise unremarkable. Sinuses/Orbits: Progressive diffuse sinus disease is present. Fluid levels are present in the maxillary sinuses and sphenoid sinuses bilaterally. Extensive mucosal thickening is present. There is diffuse opacification of ethmoid air cells. Mild mucosal thickening is present within hypoplastic frontal sinuses. The globes and orbits are within normal limits. IMPRESSION: 1. New T2 hyperintense lesion within the genu of the corpus callosum with restricted diffusion compatible with acute demyelination. 2. Multiple other periventricular T2 hyperintensities with diffuse involvement of the colossal septal margin and extension into the corona radiata bilaterally compatible with a demyelinating process. Multiple lesions demonstrate active demyelination evidence by restricted diffusion and subtle enhancement. 3. Progression of diffuse sinusitis. Electronically Signed    By: Marin Roberts M.D.   On: 12/31/2016 18:17    Medications:  I have reviewed the patient's current medications. Scheduled: . amLODipine  5 mg Oral Daily  . bethanechol  5 mg Oral TID  . ciprofloxacin  500 mg Oral BID  . DULoxetine  60 mg Oral Daily  . gabapentin  100 mg Oral TID  . [START ON 01/02/2017] Influenza vac split quadrivalent PF  0.5 mL Intramuscular Tomorrow-1000  . insulin aspart  0-9 Units Subcutaneous TID WC  . [START ON 01/02/2017] insulin aspart  8 Units Subcutaneous TID WC  . insulin glargine  15 Units Subcutaneous BID  . risperiDONE  1 mg Oral QHS  . tamsulosin  0.4 mg Oral Daily    Assessment/Plan: Patient with worsening.  MRI of the brain performed and shows evidence of active demyelinating disease.  Patient with history of no response to high dose steroids.  Can nit rule out the possibility of NMO as well.  Patient may benefit from plasmaphoresis.  This procedure can not be performed at this facility.  Have spoken with Dr. Jennet Maduro and arrangements to be made for patient to be transferred to [facility] [place] to the hospitalist service.  Case discussed with Neurohospitalist at [facility]  They will see the patient in consultation on her arrival.     LOS: 0 days   Thana Farr, MD Neurology [phone] 01/01/2017  11:01 AM

## 2017-01-01 NOTE — Progress Notes (Signed)
0430 CS 104. BP108/57 pulse 80, T 99.9. Assisted to commode, pt was unable to stand at all. Depends was wet, urine was dark yellow. Took a small amount of fluids. Bath was given. Pt is barely responding except to acknowledge yes or no. Dr. Demetrius Charity notified. Neurology consult ordered. Remains on 1:1 obs for safety.
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
## 2017-01-01 NOTE — Progress Notes (Signed)
CSW spoke with Arnetha Courser, APS worker, [facility] DSS.  [phone].  Medicaid worker there believes pt is eligible for special assistance medicaid and needs someone to come to DSS to fill out the application.  Okey Regal spoke to pt's son and he is trying to call his sister in [place] to see if she can do that.  CSW informed Okey Regal that pt is being moved to medical floor and we are unsure of plan after that.   Garner Nash, MSW, LCSW Clinical Social Worker 01/01/2017 11:45 AM
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
## 2017-01-01 NOTE — Progress Notes (Signed)
Dr. Thad Ranger called me at [phone] and asked me if the patient was going to be sent to [facility]. I did not know what she was talking about and she explained that early this morning around 4/5am she talked to Dr. Jennet Maduro about the patient needing to be transferred to [facility] for plasmaphoresis.  She said Dr. Demetrius Charity told her that it had to be hospitalist that did the transfer.  Earlier in the day I talked to Dr. Amado Coe about the patient and she made no mention of the transfer.  The [facility] RN that I received report from did not mention anything about the transfer as well.  I then called Dr. Imogene Burn the on-call MD to explain the situation to him but he just acknowledged but did not give me anymore orders.

## 2017-01-01 NOTE — Progress Notes (Signed)
Contacted by Hulan Amato RN, patient needs Med Surg bed.

## 2017-01-01 NOTE — Progress Notes (Signed)
Endorsed in creased depression. Is able to take thin liquids through a straw well with no choking/ aspiration. States she was having difficulty swallowing earlier because " they kept shoveling it in before I could swallow. Has not voided this shift, was not taking fluids. At 2100 BP was 129/61. Hydralazine was held. CS was 64 at that time. Was able to drink orange juice with sugar 2 servings and ate some peanut butter and graham crackers on her own. Repeat CS at 2130 was 92. Continued to offer oj and crackers. Refused peanut butter. At 2300 CS was 148. Will repeat at 0500. Continues on 1:1 obs for safety. Reprots visual changes that she describes as seeing white patches in front of her eyes that block parts of her view. Reported to MD. See results of MRI done 11/4.

## 2017-01-02 ENCOUNTER — Inpatient Hospital Stay (HOSPITAL_COMMUNITY)
Admission: AD | Admit: 2017-01-02 | Discharge: 2017-02-15 | DRG: 059 | Disposition: A | Discharge status: Rehabilitation Facility | Payer: Medicaid Other | Source: Other Acute Inpatient Hospital | Attending: Internal Medicine | Admitting: Internal Medicine

## 2017-01-02 DIAGNOSIS — Z452 Encounter for adjustment and management of vascular access device: Secondary | ICD-10-CM

## 2017-01-02 DIAGNOSIS — E559 Vitamin D deficiency, unspecified: Secondary | ICD-10-CM | POA: Diagnosis not present

## 2017-01-02 DIAGNOSIS — E785 Hyperlipidemia, unspecified: Secondary | ICD-10-CM | POA: Diagnosis present

## 2017-01-02 DIAGNOSIS — G4700 Insomnia, unspecified: Secondary | ICD-10-CM

## 2017-01-02 DIAGNOSIS — N179 Acute kidney failure, unspecified: Secondary | ICD-10-CM | POA: Diagnosis present

## 2017-01-02 DIAGNOSIS — F419 Anxiety disorder, unspecified: Secondary | ICD-10-CM | POA: Diagnosis present

## 2017-01-02 DIAGNOSIS — Z91048 Other nonmedicinal substance allergy status: Secondary | ICD-10-CM

## 2017-01-02 DIAGNOSIS — I208 Other forms of angina pectoris: Secondary | ICD-10-CM | POA: Diagnosis present

## 2017-01-02 DIAGNOSIS — E1065 Type 1 diabetes mellitus with hyperglycemia: Secondary | ICD-10-CM | POA: Diagnosis present

## 2017-01-02 DIAGNOSIS — K529 Noninfective gastroenteritis and colitis, unspecified: Secondary | ICD-10-CM | POA: Diagnosis not present

## 2017-01-02 DIAGNOSIS — Z86010 Personal history of colonic polyps: Secondary | ICD-10-CM

## 2017-01-02 DIAGNOSIS — N183 Chronic kidney disease, stage 3 (moderate): Secondary | ICD-10-CM | POA: Diagnosis present

## 2017-01-02 DIAGNOSIS — J45909 Unspecified asthma, uncomplicated: Secondary | ICD-10-CM | POA: Diagnosis present

## 2017-01-02 DIAGNOSIS — R443 Hallucinations, unspecified: Secondary | ICD-10-CM

## 2017-01-02 DIAGNOSIS — F322 Major depressive disorder, single episode, severe without psychotic features: Secondary | ICD-10-CM | POA: Diagnosis not present

## 2017-01-02 DIAGNOSIS — K219 Gastro-esophageal reflux disease without esophagitis: Secondary | ICD-10-CM | POA: Diagnosis present

## 2017-01-02 DIAGNOSIS — Z880 Allergy status to penicillin: Secondary | ICD-10-CM

## 2017-01-02 DIAGNOSIS — R450 Nervousness: Secondary | ICD-10-CM

## 2017-01-02 DIAGNOSIS — B373 Candidiasis of vulva and vagina: Secondary | ICD-10-CM | POA: Diagnosis present

## 2017-01-02 DIAGNOSIS — K5900 Constipation, unspecified: Secondary | ICD-10-CM | POA: Diagnosis not present

## 2017-01-02 DIAGNOSIS — E1039 Type 1 diabetes mellitus with other diabetic ophthalmic complication: Secondary | ICD-10-CM

## 2017-01-02 DIAGNOSIS — G35D Multiple sclerosis, unspecified: Secondary | ICD-10-CM | POA: Diagnosis present

## 2017-01-02 DIAGNOSIS — E10649 Type 1 diabetes mellitus with hypoglycemia without coma: Secondary | ICD-10-CM | POA: Diagnosis present

## 2017-01-02 DIAGNOSIS — G839 Paralytic syndrome, unspecified: Secondary | ICD-10-CM | POA: Diagnosis not present

## 2017-01-02 DIAGNOSIS — G35 Multiple sclerosis: Principal | ICD-10-CM | POA: Diagnosis present

## 2017-01-02 DIAGNOSIS — Z8673 Personal history of transient ischemic attack (TIA), and cerebral infarction without residual deficits: Secondary | ICD-10-CM

## 2017-01-02 DIAGNOSIS — E1022 Type 1 diabetes mellitus with diabetic chronic kidney disease: Secondary | ICD-10-CM | POA: Diagnosis present

## 2017-01-02 DIAGNOSIS — I10 Essential (primary) hypertension: Secondary | ICD-10-CM

## 2017-01-02 DIAGNOSIS — R32 Unspecified urinary incontinence: Secondary | ICD-10-CM | POA: Diagnosis not present

## 2017-01-02 DIAGNOSIS — H539 Unspecified visual disturbance: Secondary | ICD-10-CM | POA: Diagnosis present

## 2017-01-02 DIAGNOSIS — Z79899 Other long term (current) drug therapy: Secondary | ICD-10-CM

## 2017-01-02 DIAGNOSIS — E1042 Type 1 diabetes mellitus with diabetic polyneuropathy: Secondary | ICD-10-CM | POA: Diagnosis present

## 2017-01-02 DIAGNOSIS — Z841 Family history of disorders of kidney and ureter: Secondary | ICD-10-CM

## 2017-01-02 DIAGNOSIS — I129 Hypertensive chronic kidney disease with stage 1 through stage 4 chronic kidney disease, or unspecified chronic kidney disease: Secondary | ICD-10-CM | POA: Diagnosis present

## 2017-01-02 DIAGNOSIS — N390 Urinary tract infection, site not specified: Secondary | ICD-10-CM | POA: Diagnosis not present

## 2017-01-02 DIAGNOSIS — R159 Full incontinence of feces: Secondary | ICD-10-CM | POA: Diagnosis present

## 2017-01-02 DIAGNOSIS — G2581 Restless legs syndrome: Secondary | ICD-10-CM | POA: Diagnosis not present

## 2017-01-02 DIAGNOSIS — Z9109 Other allergy status, other than to drugs and biological substances: Secondary | ICD-10-CM

## 2017-01-02 DIAGNOSIS — R45851 Suicidal ideations: Secondary | ICD-10-CM | POA: Diagnosis present

## 2017-01-02 DIAGNOSIS — D72829 Elevated white blood cell count, unspecified: Secondary | ICD-10-CM

## 2017-01-02 DIAGNOSIS — R197 Diarrhea, unspecified: Secondary | ICD-10-CM

## 2017-01-02 DIAGNOSIS — H02402 Unspecified ptosis of left eyelid: Secondary | ICD-10-CM | POA: Diagnosis present

## 2017-01-02 DIAGNOSIS — R6883 Chills (without fever): Secondary | ICD-10-CM

## 2017-01-02 DIAGNOSIS — Z8249 Family history of ischemic heart disease and other diseases of the circulatory system: Secondary | ICD-10-CM

## 2017-01-02 DIAGNOSIS — E782 Mixed hyperlipidemia: Secondary | ICD-10-CM

## 2017-01-02 DIAGNOSIS — Z794 Long term (current) use of insulin: Secondary | ICD-10-CM

## 2017-01-02 LAB — CBC
HCT: 34.7 % — ABNORMAL LOW (ref 35.0–47.0)
Hemoglobin: 11.4 g/dL — ABNORMAL LOW (ref 12.0–16.0)
MCH: 29.8 pg (ref 26.0–34.0)
MCHC: 33 g/dL (ref 32.0–36.0)
MCV: 90.5 fL (ref 80.0–100.0)
PLATELETS: 231 10*3/uL (ref 150–440)
RBC: 3.83 MIL/uL (ref 3.80–5.20)
RDW: 15.2 % — AB (ref 11.5–14.5)
WBC: 10 10*3/uL (ref 3.6–11.0)

## 2017-01-02 LAB — BASIC METABOLIC PANEL
ANION GAP: 9 (ref 5–15)
BUN: 44 mg/dL — ABNORMAL HIGH (ref 6–20)
CO2: 28 mmol/L (ref 22–32)
Calcium: 8.6 mg/dL — ABNORMAL LOW (ref 8.9–10.3)
Chloride: 96 mmol/L — ABNORMAL LOW (ref 101–111)
Creatinine, Ser: 2.29 mg/dL — ABNORMAL HIGH (ref 0.44–1.00)
GFR, EST AFRICAN AMERICAN: 28 mL/min — AB (ref >60)
GFR, EST NON AFRICAN AMERICAN: 24 mL/min — AB (ref >60)
GLUCOSE: 345 mg/dL — AB (ref 65–99)
POTASSIUM: 4.4 mmol/L (ref 3.5–5.1)
Sodium: 133 mmol/L — ABNORMAL LOW (ref 135–145)

## 2017-01-02 LAB — GLUCOSE, CAPILLARY
GLUCOSE-CAPILLARY: 304 mg/dL — AB (ref 65–99)
Glucose-Capillary: 337 mg/dL — ABNORMAL HIGH (ref 65–99)
Glucose-Capillary: 266 mg/dL — ABNORMAL HIGH (ref 65–99)
Glucose-Capillary: 315 mg/dL — ABNORMAL HIGH (ref 65–99)
Glucose-Capillary: 221 mg/dL — ABNORMAL HIGH (ref 65–99)
Glucose-Capillary: 283 mg/dL — ABNORMAL HIGH (ref 65–99)

## 2017-01-02 MED ORDER — INSULIN ASPART 100 UNIT/ML ~~LOC~~ SOLN: 0.0000 [IU] | Freq: Three times a day (TID) | SUBCUTANEOUS | 11 refills | Status: DC

## 2017-01-02 MED ORDER — ATORVASTATIN CALCIUM 40 MG PO TABS
40.0000 mg | ORAL_TABLET | Freq: Every day | ORAL | Status: DC
  Administered 2017-01-03 – 2017-02-14 (×43): 40 mg via ORAL
  Filled 2017-01-02 (×44): qty 1

## 2017-01-02 MED ORDER — CIPROFLOXACIN HCL 500 MG PO TABS: 500.0000 mg | ORAL_TABLET | Freq: Two times a day (BID) | ORAL | Status: DC

## 2017-01-02 MED ORDER — ASPIRIN EC 81 MG PO TBEC
81.0000 mg | DELAYED_RELEASE_TABLET | Freq: Every day | ORAL | Status: DC
  Administered 2017-01-03 – 2017-02-14 (×42): 81 mg via ORAL
  Filled 2017-01-02 (×43): qty 1

## 2017-01-02 MED ORDER — ALUM & MAG HYDROXIDE-SIMETH 200-200-20 MG/5ML PO SUSP: 30.0000 mL | Freq: Four times a day (QID) | ORAL | Status: DC | PRN

## 2017-01-02 MED ORDER — INSULIN ASPART 100 UNIT/ML ~~LOC~~ SOLN: 0.0000 [IU] | Freq: Three times a day (TID) | SUBCUTANEOUS | Status: DC

## 2017-01-02 MED ORDER — ALBUTEROL SULFATE (2.5 MG/3ML) 0.083% IN NEBU: 2.5000 mg | INHALATION_SOLUTION | RESPIRATORY_TRACT | Status: DC | PRN

## 2017-01-02 MED ORDER — METOPROLOL SUCCINATE ER 25 MG PO TB24
75.0000 mg | ORAL_TABLET | Freq: Every day | ORAL | Status: DC
  Administered 2017-01-03 – 2017-01-26 (×24): 75 mg via ORAL
  Filled 2017-01-02 (×27): qty 3

## 2017-01-02 MED ORDER — RISPERIDONE 0.5 MG PO TABS
1.0000 mg | ORAL_TABLET | Freq: Every day | ORAL | Status: DC
  Administered 2017-01-02: 1 mg via ORAL
  Filled 2017-01-02: qty 2

## 2017-01-02 MED ORDER — ONDANSETRON HCL 4 MG PO TABS
4.0000 mg | ORAL_TABLET | Freq: Three times a day (TID) | ORAL | Status: DC | PRN
  Administered 2017-01-05: 4 mg via ORAL
  Filled 2017-01-02 (×2): qty 1

## 2017-01-02 MED ORDER — INSULIN ASPART 100 UNIT/ML ~~LOC~~ SOLN
0.0000 [IU] | Freq: Every day | SUBCUTANEOUS | Status: DC
Start: 2017-01-02 — End: 2017-01-02

## 2017-01-02 MED ORDER — BETHANECHOL CHLORIDE 10 MG PO TABS: 5.0000 mg | ORAL_TABLET | Freq: Three times a day (TID) | ORAL | Status: DC

## 2017-01-02 MED ORDER — AMLODIPINE BESYLATE 5 MG PO TABS: 5.0000 mg | ORAL_TABLET | Freq: Every day | ORAL | Status: DC

## 2017-01-02 MED ORDER — GABAPENTIN 100 MG PO CAPS: 100.0000 mg | ORAL_CAPSULE | Freq: Three times a day (TID) | ORAL | Status: DC

## 2017-01-02 MED ORDER — LISINOPRIL 20 MG PO TABS
20.0000 mg | ORAL_TABLET | Freq: Two times a day (BID) | ORAL | Status: DC
  Filled 2017-01-02: qty 1

## 2017-01-02 MED ORDER — HEPARIN SODIUM (PORCINE) 5000 UNIT/ML IJ SOLN: 5000.0000 [IU] | Freq: Three times a day (TID) | INTRAMUSCULAR | Status: DC

## 2017-01-02 MED ORDER — ACETAMINOPHEN 325 MG PO TABS
650.0000 mg | ORAL_TABLET | ORAL | Status: DC | PRN
  Administered 2017-01-02 – 2017-02-09 (×16): 650 mg via ORAL
  Filled 2017-01-02 (×16): qty 2

## 2017-01-02 MED ORDER — MAGNESIUM HYDROXIDE 400 MG/5ML PO SUSP
30.0000 mL | Freq: Every day | ORAL | Status: DC | PRN
  Administered 2017-01-10 – 2017-01-29 (×2): 30 mL via ORAL
  Filled 2017-01-02 (×2): qty 30

## 2017-01-02 MED ORDER — DULOXETINE HCL 60 MG PO CPEP: 60.0000 mg | ORAL_CAPSULE | Freq: Every day | ORAL | Status: DC

## 2017-01-02 MED ORDER — INSULIN ASPART 100 UNIT/ML ~~LOC~~ SOLN
10.0000 [IU] | Freq: Three times a day (TID) | SUBCUTANEOUS | Status: DC
  Administered 2017-01-02 – 2017-01-16 (×33): 10 [IU] via SUBCUTANEOUS

## 2017-01-02 MED ORDER — PANTOPRAZOLE SODIUM 40 MG PO TBEC
40.0000 mg | DELAYED_RELEASE_TABLET | Freq: Every day | ORAL | Status: DC
Start: 2017-01-02 — End: 2017-02-15
  Administered 2017-01-03 – 2017-02-14 (×44): 40 mg via ORAL
  Filled 2017-01-02 (×44): qty 1

## 2017-01-02 MED ORDER — SODIUM CHLORIDE 0.9 % IV BOLUS (SEPSIS)
1000.0000 mL | Freq: Once | INTRAVENOUS | Status: AC
  Administered 2017-01-02: 1000 mL via INTRAVENOUS

## 2017-01-02 MED ORDER — INSULIN GLARGINE 100 UNIT/ML ~~LOC~~ SOLN: 15.0000 [IU] | Freq: Two times a day (BID) | SUBCUTANEOUS | 11 refills | Status: DC

## 2017-01-02 MED ORDER — RISPERIDONE 1 MG PO TABS: 1.0000 mg | ORAL_TABLET | Freq: Every day | ORAL | Status: DC

## 2017-01-02 MED ORDER — RISPERIDONE 0.5 MG PO TABS: 1.0000 mg | ORAL_TABLET | Freq: Every day | ORAL | Status: DC

## 2017-01-02 MED ORDER — MELOXICAM 7.5 MG PO TABS
15.0000 mg | ORAL_TABLET | Freq: Every morning | ORAL | Status: DC
  Filled 2017-01-02: qty 2

## 2017-01-02 MED ORDER — AMLODIPINE BESYLATE 10 MG PO TABS
10.0000 mg | ORAL_TABLET | Freq: Every day | ORAL | Status: DC
  Administered 2017-01-03 – 2017-02-14 (×43): 10 mg via ORAL
  Filled 2017-01-02 (×43): qty 1

## 2017-01-02 MED ORDER — SODIUM CHLORIDE 0.9% FLUSH
3.0000 mL | Freq: Two times a day (BID) | INTRAVENOUS | Status: DC
  Administered 2017-01-03 – 2017-01-21 (×22): 3 mL via INTRAVENOUS
  Administered 2017-01-21: 10 mL via INTRAVENOUS
  Administered 2017-01-22 – 2017-02-14 (×25): 3 mL via INTRAVENOUS

## 2017-01-02 MED ORDER — INSULIN GLARGINE 100 UNIT/ML ~~LOC~~ SOLN: 15.0000 [IU] | Freq: Two times a day (BID) | SUBCUTANEOUS | Status: DC

## 2017-01-02 MED ORDER — HYDRALAZINE HCL 50 MG PO TABS
75.0000 mg | ORAL_TABLET | Freq: Three times a day (TID) | ORAL | Status: DC
  Administered 2017-01-02 – 2017-01-24 (×61): 75 mg via ORAL
  Filled 2017-01-02 (×66): qty 1

## 2017-01-02 MED ORDER — BETHANECHOL CHLORIDE 5 MG PO TABS: 5.0000 mg | ORAL_TABLET | Freq: Three times a day (TID) | ORAL | Status: DC

## 2017-01-02 MED ORDER — ACETAMINOPHEN 325 MG PO TABS: 650.0000 mg | ORAL_TABLET | Freq: Four times a day (QID) | ORAL | Status: DC | PRN

## 2017-01-02 MED ORDER — DULOXETINE HCL 60 MG PO CPEP
60.0000 mg | ORAL_CAPSULE | Freq: Every day | ORAL | Status: DC
  Administered 2017-01-03 – 2017-02-14 (×43): 60 mg via ORAL
  Filled 2017-01-02 (×43): qty 1

## 2017-01-02 MED ORDER — TAMSULOSIN HCL 0.4 MG PO CAPS: 0.4000 mg | ORAL_CAPSULE | Freq: Every day | ORAL | Status: DC

## 2017-01-02 MED ORDER — INSULIN GLARGINE 100 UNIT/ML ~~LOC~~ SOLN
17.0000 [IU] | Freq: Two times a day (BID) | SUBCUTANEOUS | Status: DC
  Administered 2017-01-02 – 2017-01-04 (×4): 17 [IU] via SUBCUTANEOUS
  Filled 2017-01-02 (×5): qty 0.17

## 2017-01-02 MED ORDER — DULOXETINE HCL 60 MG PO CPEP
60.0000 mg | ORAL_CAPSULE | Freq: Every day | ORAL | 3 refills | Status: DC
Start: 2017-01-03 — End: 2017-04-24

## 2017-01-02 MED ORDER — GABAPENTIN 300 MG PO CAPS
600.0000 mg | ORAL_CAPSULE | Freq: Three times a day (TID) | ORAL | Status: DC
  Administered 2017-01-02 (×2): 600 mg via ORAL
  Filled 2017-01-02 (×4): qty 2

## 2017-01-02 MED ORDER — BETHANECHOL CHLORIDE 25 MG PO TABS
50.0000 mg | ORAL_TABLET | Freq: Three times a day (TID) | ORAL | Status: DC
  Administered 2017-01-02 – 2017-02-14 (×129): 50 mg via ORAL
  Filled 2017-01-02 (×130): qty 2

## 2017-01-02 MED ORDER — ZOLPIDEM TARTRATE 5 MG PO TABS
5.0000 mg | ORAL_TABLET | Freq: Every evening | ORAL | Status: DC | PRN
  Administered 2017-01-12 – 2017-02-04 (×4): 5 mg via ORAL
  Filled 2017-01-02 (×6): qty 1

## 2017-01-02 MED ORDER — TRAZODONE HCL 50 MG PO TABS: 50.0000 mg | ORAL_TABLET | Freq: Every day | ORAL | Status: DC

## 2017-01-02 MED ORDER — SODIUM CHLORIDE 0.9 % IV SOLN
INTRAVENOUS | Status: AC
  Administered 2017-01-02: 23:00:00 via INTRAVENOUS

## 2017-01-02 MED ORDER — ALUM & MAG HYDROXIDE-SIMETH 200-200-20 MG/5ML PO SUSP: 30.0000 mL | ORAL | Status: DC | PRN

## 2017-01-02 MED ORDER — SODIUM CHLORIDE 0.9 % IV SOLN: INTRAVENOUS | Status: DC

## 2017-01-02 MED ORDER — ESCITALOPRAM OXALATE 10 MG PO TABS
5.0000 mg | ORAL_TABLET | Freq: Every day | ORAL | Status: DC
  Filled 2017-01-02: qty 1

## 2017-01-02 MED ORDER — INSULIN ASPART 100 UNIT/ML ~~LOC~~ SOLN: 8.0000 [IU] | Freq: Three times a day (TID) | SUBCUTANEOUS | 11 refills | Status: DC

## 2017-01-02 NOTE — Progress Notes (Signed)
PT Cancellation Note  Patient Details Name: Debra [name] MRN: [id number] DOB: 13-Jan-1966   Cancelled Treatment:    Reason Eval/Treat Not Completed: Other (comment).  Pt preparing for transfer to [facility] within the hour per RN.  Will keep pt on PT's caseload just in case pt remains at [facility].    Encarnacion Chu PT, DPT 01/02/2017, 10:24 AM

## 2017-01-02 NOTE — H&P (Deleted)
Date: 01/02/2017               Patient Name:  Debra [name] MRN: [id number]  DOB: 05/14/1965 Age / Sex: 51 y.o., female   PCP: [name], MD         Medical Service: Internal Medicine Teaching Service         Attending Physician: Dr. [name], MD    First Contact: Dr. Anthonette Legato Pager: [phone]  Second Contact: Dr. Johnny Bridge Pager: [phone]       After Hours (After 5p/  First Contact Pager: [phone]  weekends / holidays): Second Contact Pager: [phone]   Chief Complaint: Vision changes  History of Present Illness: 51 year old female with past medical history of depression, multiple sclerosis, hypertension, diabetes, who presented as a transfer from outside hospital for consideration of plasmapheresis.  Patient was recently diagnosed with multiple sclerosis in September 2018.  She states she has lower extremity weakness but was able to ambulate with a rolling walker until 3 days ago.  She is also had worsening bladder/bowel incontinence for the past 2 months.  2 days ago her vision acutely worsened and she states she is no longer able to read.  She also notes lower extremity burning pain.  She was admitted to [facility] and seen by neurology.  Repeat MRI noted new demyelinating lesions.  Given concern for prior resistance to steroids and potential neuromyelitis optica the patient was transferred to [facility] for potential plasmapheresis treatment.  She has also had a depressed mood that she attributes to her current medical situation.  She has had associated difficulty sleeping, lack of interest in previous hobbies, decreased energy, as well as suicidal ideations stating that she wants to die.  She has formulated plans including taking pills and stepping in front of a moving vehicle but has been limited in doing so by her decreased mobility and medical issues.  Denies homicidal ideation.  Notes auditory hallucinations which are vague and scratch that rather  than the voices.  She reports trying several antidepressant medications in the past.  No history of suicide attempts.  Chart review shows she was evaluated by psychiatry at [facility] prior to transfer.  Meds:  Current Meds  Medication Sig  . [START ON 01/03/2017] amLODipine (NORVASC) 5 MG tablet Take 1 tablet (5 mg total) daily by mouth.  . bethanechol (URECHOLINE) 5 MG tablet Take 1 tablet (5 mg total) 3 (three) times daily by mouth.  . 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 The patient tests 4 times per day.  . ciprofloxacin (CIPRO) 500 MG tablet Take 1 tablet (500 mg total) 2 (two) times daily by mouth.  [name] ON 01/03/2017] DULoxetine (CYMBALTA) 60 MG capsule Take 1 capsule (60 mg total) daily by mouth.  . gabapentin (NEURONTIN) 100 MG capsule Take 1 capsule (100 mg total) 3 (three) times daily by mouth.  . insulin aspart (NOVOLOG) 100 UNIT/ML injection Inject 0-15 Units 3 (three) times daily with meals into the skin.  Marland Kitchen insulin aspart (NOVOLOG) 100 UNIT/ML injection Inject 8 Units 3 (three) times daily with meals into the skin.  Marland Kitchen insulin glargine (LANTUS) 100 UNIT/ML injection Inject 0.15 mLs (15 Units total) 2 (two) times daily into the skin.  Marland Kitchen risperiDONE (RISPERDAL) 1 MG tablet Take 1 tablet (1 mg total) at bedtime by mouth.  [name] ON 01/03/2017] tamsulosin (FLOMAX) 0.4 MG CAPS capsule Take 1 capsule (  0.4 mg total) daily by mouth.     Allergies: Allergies as of 01/02/2017 - Review Complete 01/02/2017  Allergen Reaction Noted  . Penicillins Anaphylaxis, Nausea And Vomiting, and Rash 07/06/2010  . Pollen extract  11/15/2016  . Tape Rash 02/24/2016   Past Medical History:  Diagnosis Date  . Adenomatous colonic polyps   . Anemia    2005  . Anxiety    1990  . Arthritis   . Asthma    2000  . Cataract   . Depression 1990  . Difficult intubation    narrow airway  . Gastroesophageal Reflux  Disease (GERD)   . Heart murmur    Birth  . Hyperlipidemia    2005  . Hypertension    1998  . Internal hemorrhoids 04/24/06   on colonoscopy  . Migraine   . Neuropathy of the hands & feet   . Restless Leg Syndrome   . Right Ankle Fracture 10/06/2013  . Sleep paralysis   . Stable angina pectoris    2007: cath showing normal cors.   . Stroke 1990  . Type I Diabetes Mellitus 1988    Family History:  Family History  Problem Relation Age of Onset  . Hypertension Mother   . Kidney disease Mother   . Hypertension Father   . Breast cancer Maternal Grandmother   . Prostate cancer Maternal Grandfather   . Ovarian cancer Paternal Grandmother   . Prostate cancer Paternal Grandfather   . Colon cancer Maternal Uncle        Family history of malignant neoplasm of gastrointestinal tract     Social History:  Social History   Tobacco Use  . Smoking status: Never Smoker  . Smokeless tobacco: Never Used  Substance Use Topics  . Alcohol use: No    Alcohol/week: 0.0 oz  . Drug use: No    Comment: drug addict     Review of Systems: A complete ROS was negative except as per HPI.   Physical Exam: Blood pressure (!) 189/84, pulse 78, temperature 99.2 F (37.3 C), temperature source Oral, resp. rate 16, SpO2 99 %. Physical Exam  Constitutional: She is oriented to person, place, and time.  Sitting in bed eating lunch, no acute distress   HENT:  Mouth/Throat: Oropharynx is clear and moist.  Eyes: EOM are normal. Pupils are equal, round, and reactive to light.  Cardiovascular: Normal rate and regular rhythm.  Systolic murmur   Pulmonary/Chest: Effort normal and breath sounds normal. No respiratory distress.  Abdominal: Soft. Bowel sounds are normal. She exhibits no distension. There is no tenderness.  Neurological: She is alert and oriented to person, place, and time.  3/5 strength in bilateral LEs, 5/5 in uper extremities, decreased visual acuity with visual fields intact, CN  otherwise normal, diminished LE reflexes, decreased rectal tone, normal coordination on ftn    Skin: Skin is warm and dry.  Psychiatric:  Depressed mood     Assessment & Plan by Problem:  Multiple Sclerosis,  Concern for NMO  Patient with a history of multiple sclerosis diagnosed on recent admission in September 2018 presenting with worsening existing deficits of lower extremity weakness and bladder/bowel incontinence as well as new vision deficits.  Evidence of new demyelination on MRI.  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  On initial discussion of case with neurology will provide additional steroid dose tonight and consider plasmapheresis  initiation tomorrow. --Neuro consulted, appreciate recommendations --Solumedrol 1 g, potential plasmapheresis tomorrow --Hold heparin ppx for potential LP, central access  --Neuro checks   Major Depressive Disorder Patient with a history of severe depression with psychotic features requiring inpatient psychiatric admission currently expressing suicidal ideation.  She was evaluated by psychiatry prior to transfer who recommended Cymbalta 60 mg and risperidone 1 mg. --Continue Cymbalta 60 mg, Risperdal 1 mg --Suicide precautions, sitter --Psych eval here   UTI Chart review notes patient undergoing treatment for UTI with Cipro.  No UA or urine culture available on chart review, unclear start date as well.  Patient is also on bethanechol and tamsulosin. Will hold off on resuming Cipro until clarification or other data available  --Rpt U/A   Acute Kidney Injury Patient also has elevated creatinine to 2.29 compared to prior values around 1.5.  Unclear etiology the patient appears to have been on lisinopril and meloxicam prior to transfer. --Hold lisinopril, mobic --Avoid other nephrotoxic agents --NS 100 ml/hr and  assess response    H/o Diabetes Patient has history of diabetes with peripheral neuropathy. --Lantus 17 units twice daily --NovoLog 10 units 3 times daily with meals --Continue gabapentin  Hypertension Patient with a history of hypertension and elevated BP on arrival from hospital transfer. --Continue hydralazine, amlodipine, metoprolol   Dispo: Admit patient to Inpatient with expected length of stay greater than 2 midnights.  Signed: Ginger CarneHarden, Kyler, MD 01/02/2017, 6:05 PM  Pager: [phone]

## 2017-01-02 NOTE — Plan of Care (Signed)
   Pt coming from [facility] for treatment of acute MS that is not responding to steroids and in need of plasmapheresis. Neurology aware of transfer and will need to be called when pt arrives. Pt will be admitted to Med surge bed under inpt status on [facility] service. Of note pt also receiving treatment for UTI and suicidal ideation currently w/ sitter in the room and w/ psych following. Psych will need to be consulted when pt arrives at [facility].   Shelly Flatten, MD [facility] 01/02/2017, 10:33 AM

## 2017-01-02 NOTE — Discharge Summary (Signed)
[facility] [place] at [facility]   PATIENT NAME: Debra [name]    MR#:  [id number]  DATE OF BIRTH:  June 19, 1965  DATE OF ADMISSION:  01/01/2017 ADMITTING PHYSICIAN: Arnaldo Natal, MD  DATE OF DISCHARGE: 01/02/2017  PRIMARY CARE PHYSICIAN: Nyra Market, MD    ADMISSION DIAGNOSIS:  MS exacerbation  DISCHARGE DIAGNOSIS:  Principal Problem:   Major depressive disorder, recurrent, severe with psychotic features (HCC) Active Problems:   Multiple sclerosis exacerbation (HCC)   SECONDARY DIAGNOSIS:   Past Medical History:  Diagnosis Date  . Adenomatous colonic polyps   . Anemia    2005  . Anxiety    1990  . Arthritis   . Asthma    2000  . Cataract   . Depression 1990  . Difficult intubation    narrow airway  . Gastroesophageal Reflux Disease (GERD)   . Heart murmur    Birth  . Hyperlipidemia    2005  . Hypertension    1998  . Internal hemorrhoids 04/24/06   on colonoscopy  . Migraine   . Neuropathy of the hands & feet   . Restless Leg Syndrome   . Right Ankle Fracture 10/06/2013  . Sleep paralysis   . Stable angina pectoris    2007: cath showing normal cors.   . Stroke 1990  . Type I Diabetes Mellitus 1988    HOSPITAL COURSE:   HPI: The patient with past medical history of depression, MS, hypertension, diabetes type 1, and recurrent UTI presented to the psychiatry unit as a transfer due to major depressive episode.  At the time of admission the patient was having difficulty walking but was able to ambulate with a rolling walker.  But now she is barely able to stand.  This is contributed to her depression.  MRI of her brain shows new lesions consistent with multiple sclerosis.  Now the patient reportedly has floaters as well as central scotoma in her vision.  She is unable to significantly verbalize her concerns and complaints at this time although she is able to follow commands to some degree.  Psychiatry called the hospitalist  service to assume care as her condition has progressed beyond her psychiatric needs  1. Acute on ch LE weakness - 2/2 Multiple sclerosis exacerbation;  Reports weakness in lower extremities ,allso decreased energy,tone and visual changes. MRI brain with acute demyelination process Received Solu-Medrol 1 g IV x1 while pending neurology consult Patient was seen by neurologist, patient has a history of no response to high-dose steroids in the past, cannot rule out the possibility of NMO as well, recommended to transfer the patient to [facility] to hospitalist service and she has discussed the case with neuro hospitalist at [facility] hospital.  Patient is agreeable to be  Transferred.   Accepted by [facility] hospitalist Ortencia Kick  2. Hypertension: controlled; continue amlodipine, metoprolol Not on hydralazine, HCTZ, and lisinopri at this time  3. Diabetes mellitus type 1: With neuropathy.  Continue gabapentin for neuropathy Insulin sliding scale and NovoLog 8 units 3 times daily with each meal Lantus 15 units twice daily  4. UTI: Present on admission; continue Cipro, bethanecholand tamsulosin  5. Depression: Severe; continue Cymbalta and Risperdal, still having suicidal thoughts One-on-one observation for safety, follow-up with Dr. Toni Amend  6. DVT prophylaxis: Heparin  7. GI prophylaxis: Pantoprazole per home regimen     DISCHARGE CONDITIONS:  guarded  CONSULTS OBTAINED:  Treatment Team:  Thana Farr, MD Toni Amend, Jonny Ruiz  T, MD   PROCEDURES  None   DRUG ALLERGIES:   Allergies  Allergen Reactions  . Penicillins Anaphylaxis, Nausea And Vomiting and Rash    Has patient had a PCN reaction causing immediate rash, facial/tongue/throat swelling, SOB or lightheadedness with hypotension: Yes Has patient had a PCN reaction causing severe rash involving mucus membranes or skin necrosis: Yes Has patient had a PCN reaction that required  hospitalization No Has patient had a PCN reaction occurring within the last 10 years: Yes If all of the above answers are "NO", then may proceed with Cephalosporin use.   . Pollen Extract     Seasonal Allergies  . Tape Rash    DISCHARGE MEDICATIONS:   Current Discharge Medication List    START taking these medications   Details  amLODipine (NORVASC) 5 MG tablet Take 1 tablet (5 mg total) daily by mouth.    bethanechol (URECHOLINE) 5 MG tablet Take 1 tablet (5 mg total) 3 (three) times daily by mouth.    ciprofloxacin (CIPRO) 500 MG tablet Take 1 tablet (500 mg total) 2 (two) times daily by mouth.    DULoxetine (CYMBALTA) 60 MG capsule Take 1 capsule (60 mg total) daily by mouth. Refills: 3    gabapentin (NEURONTIN) 100 MG capsule Take 1 capsule (100 mg total) 3 (three) times daily by mouth.    !! insulin aspart (NOVOLOG) 100 UNIT/ML injection Inject 0-15 Units 3 (three) times daily with meals into the skin. Qty: 10 mL, Refills: 11    !! insulin aspart (NOVOLOG) 100 UNIT/ML injection Inject 8 Units 3 (three) times daily with meals into the skin. Qty: 10 mL, Refills: 11    insulin glargine (LANTUS) 100 UNIT/ML injection Inject 0.15 mLs (15 Units total) 2 (two) times daily into the skin. Qty: 10 mL, Refills: 11    risperiDONE (RISPERDAL) 1 MG tablet Take 1 tablet (1 mg total) at bedtime by mouth.    tamsulosin (FLOMAX) 0.4 MG CAPS capsule Take 1 capsule (0.4 mg total) daily by mouth. Qty: 30 capsule     !! - Potential duplicate medications found. Please discuss with provider.    CONTINUE these medications which have NOT CHANGED   Details  Blood Glucose Monitoring Suppl (AGAMATRIX PRESTO PRO METER) DEVI The patient is insulin requiring, ICD 10 code E10.9. The patient tests 4 times per day. 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 Kirby Funk 1:1 OBSERVATION  DIET:  Diabetic diet  DISCHARGE CONDITION:  Fair  ACTIVITY:  Bedrest  OXYGEN:  Home Oxygen: No.   Oxygen Delivery: room air  DISCHARGE LOCATION:   Transfer patient to [facility] hospitalist Dr. Kirby Funk  If you experience worsening of your admission symptoms, develop shortness of breath, life threatening emergency, suicidal or homicidal thoughts you must seek medical attention immediately by calling 911 or calling your MD immediately  if symptoms less severe.  You Must read complete instructions/literature along with all the possible adverse reactions/side effects for all the Medicines you take and that have been prescribed to you. Take any new Medicines after you have completely understood and accpet all the possible  adverse reactions/side effects.   Please note  You were cared for by a hospitalist during your hospital stay. If you have any questions about your discharge medications or the care you received while you were in the hospital after you are discharged, you can call the unit and asked to speak with the hospitalist on call if the hospitalist that took care of you is not available. Once you are discharged, your primary care physician will handle any further medical issues. Please note that NO REFILLS for any discharge medications will be authorized once you are discharged, as it is imperative that you return to your primary care physician (or establish a relationship with a primary care physician if you do not have one) for your aftercare needs so that they can reassess your need for medications and monitor your lab values.     Today   Patient is resting comfortably.  Reporting weakness in her lower extremities, too weak to walk.  Has chronic multiple  sclerosis  ROS:  CONSTITUTIONAL: Denies fevers, chills. Denies any fatigue, weakness.  EYES: Denies blurry vision, double vision, eye pain. EARS, NOSE, THROAT: Denies tinnitus, ear pain, hearing loss. RESPIRATORY: Denies cough, wheeze, shortness of breath.  CARDIOVASCULAR: Denies chest pain, palpitations, edema.  GASTROINTESTINAL: Denies nausea, vomiting, diarrhea, abdominal pain. Denies bright red blood per rectum. GENITOURINARY: Denies dysuria, hematuria. ENDOCRINE: Denies nocturia or thyroid problems. HEMATOLOGIC AND LYMPHATIC: Denies easy bruising or bleeding. SKIN: Denies rash or lesion. MUSCULOSKELETAL: Denies pain in neck, back, shoulder, knees, hips or arthritic symptoms.  NEUROLOGIC: Reporting bilateral lower extremity weakness and difficulty with walking PSYCHIATRIC: Denies anxiety or depressive symptoms.   VITAL SIGNS:  Blood pressure (!) 140/59, pulse 85, temperature 98.5 F (36.9 C), temperature source Oral, resp. rate 16, height 5\' 4"  (1.626 m), weight 73.4 kg (161 lb 14.4 oz), SpO2 100 %.  I/O:    Intake/Output Summary (Last 24 hours) at 01/02/2017 1024 Last data filed at 01/02/2017 1020 Gross per 24 hour  Intake 840 ml  Output 601 ml  Net 239 ml    PHYSICAL EXAMINATION:  GENERAL:  51 y.o.-year-old patient lying in the bed with no acute distress.  EYES: Pupils equal, round, reactive to light and accommodation. No scleral icterus. Extraocular muscles intact.  HEENT: Head atraumatic, normocephalic. Oropharynx and nasopharynx clear.  NECK:  Supple, no jugular venous distention. No thyroid enlargement, no tenderness.  LUNGS: Normal breath sounds bilaterally, no wheezing, rales,rhonchi or crepitation. No use of accessory muscles of respiration.  CARDIOVASCULAR: S1, S2 normal. No murmurs, rubs, or gallops.  ABDOMEN: Soft, non-tender, non-distended. Bowel sounds present. No organomegaly or mass.  EXTREMITIES: No pedal edema, cyanosis, or clubbing.  NEUROLOGIC: Cranial  nerves II through XII are intact.  No ptosis present muscle strength 5/5 in all extremities but 3 out of 5 in bilateral lower extremities. Gait not checked.  PSYCHIATRIC: The patient is alert and oriented x 3.  SKIN: No obvious rash, lesion, or ulcer.   DATA REVIEW:   CBC Recent Labs  Lab 01/02/17 0410  WBC 10.0  HGB 11.4*  HCT 34.7*  PLT 231    Chemistries  Recent Labs  Lab 01/02/17 0410  NA 133*  K 4.4  CL 96*  CO2 28  GLUCOSE 345*  BUN 44*  CREATININE 2.29*  CALCIUM 8.6*    Cardiac Enzymes No results for input(s): TROPONINI in the last 168 hours.  Microbiology Results  Results for orders placed or performed during the hospital encounter of 12/22/16  Urine  Culture     Status: Abnormal   Collection Time: 12/24/16 12:28 AM  Result Value Ref Range Status   Specimen Description URINE, CLEAN CATCH  Final   Special Requests NONE  Final   Culture >=100,000 COLONIES/mL KLEBSIELLA PNEUMONIAE (A)  Final   Report Status 12/26/2016 FINAL  Final   Organism ID, Bacteria KLEBSIELLA PNEUMONIAE (A)  Final      Susceptibility   Klebsiella pneumoniae - MIC*    AMPICILLIN >=32 RESISTANT Resistant     CEFAZOLIN <=4 SENSITIVE Sensitive     CEFTRIAXONE <=1 SENSITIVE Sensitive     CIPROFLOXACIN 1 SENSITIVE Sensitive     GENTAMICIN <=1 SENSITIVE Sensitive     IMIPENEM <=0.25 SENSITIVE Sensitive     NITROFURANTOIN 128 RESISTANT Resistant     TRIMETH/SULFA <=20 SENSITIVE Sensitive     AMPICILLIN/SULBACTAM 8 SENSITIVE Sensitive     PIP/TAZO 8 SENSITIVE Sensitive     Extended ESBL NEGATIVE Sensitive     * >=100,000 COLONIES/mL KLEBSIELLA PNEUMONIAE    RADIOLOGY:  Mr Laqueta Jean Wo Contrast  Result Date: 12/31/2016 CLINICAL DATA:  Recent diagnosis of multiple sclerosis without significant improvement following Solu-Medrol. EXAM: MRI HEAD WITHOUT AND WITH CONTRAST TECHNIQUE: Multiplanar, multiecho pulse sequences of the brain and surrounding structures were obtained without and with  intravenous contrast. CONTRAST:  58mL MULTIHANCE GADOBENATE DIMEGLUMINE 529 MG/ML IV SOLN COMPARISON:  MRI brain 11/21/2016 at [facility]. FINDINGS: Brain: Multiple periventricular and subcortical T2 hyperintensities are again noted. There is diffuse involvement of the colossal septal margin. Mild restricted diffusion is again seen. There is faint enhancement of these areas. There is prominent extension into the anterior corona radiata bilaterally. Involvement of the genu of the corpus callosum is new. There is restricted diffusion of this lesion. The lesion does not significantly enhance. No other new lesions are present. T2 changes extending into the brainstem are stable. No acute hemorrhage or mass lesion is present. The ventricles are of normal size. No significant extra-axial fluid collection is present. Vascular: Flow is present in the major intracranial arteries. Skull and upper cervical spine: The skullbase is within normal limits. Midline scratched at the craniocervical junction is normal. Midline sagittal structures are otherwise unremarkable. Sinuses/Orbits: Progressive diffuse sinus disease is present. Fluid levels are present in the maxillary sinuses and sphenoid sinuses bilaterally. Extensive mucosal thickening is present. There is diffuse opacification of ethmoid air cells. Mild mucosal thickening is present within hypoplastic frontal sinuses. The globes and orbits are within normal limits. IMPRESSION: 1. New T2 hyperintense lesion within the genu of the corpus callosum with restricted diffusion compatible with acute demyelination. 2. Multiple other periventricular T2 hyperintensities with diffuse involvement of the colossal septal margin and extension into the corona radiata bilaterally compatible with a demyelinating process. Multiple lesions demonstrate active demyelination evidence by restricted diffusion and subtle enhancement. 3. Progression of diffuse sinusitis. Electronically Signed    By: Marin Roberts M.D.   On: 12/31/2016 18:17    EKG:   Orders placed or performed during the hospital encounter of 11/15/16  . ED EKG  . ED EKG  . ED EKG  . ED EKG  . EKG 12-Lead  . EKG 12-Lead      Management plans discussed with the patient, SHE IS  in agreement.  CODE STATUS:  Code Status History    Date Active Date Inactive Code Status Order ID Comments User Context   12/22/2016 17:22 01/01/2017 13:09 Full Code [id number]  Shari Prows, MD Inpatient  12/20/2016 22:04 12/22/2016 17:22 Full Code [id number]  Donnetta Hutchingook, Brian, MD ED   11/22/2016 18:22 12/15/2016 23:59 Full Code [id number]  Charlton Amorngiulli, Daniel J, PA-C Inpatient   11/22/2016 18:22 11/22/2016 18:22 Full Code [id number]  Lynnae Prudengiulli, Daniel J, PA-C Inpatient   11/15/2016 22:50 11/22/2016 17:33 Full Code [id number]  Deneise LeverSaraiya, Parth, MD Inpatient   11/15/2016 16:53 11/15/2016 22:50 Full Code [id number]  Deneise LeverSaraiya, Parth, MD ED   02/24/2016 01:20 02/26/2016 18:47 Full Code [id number]  Fuller Planice, Christopher W, MD ED      TOTAL TIME TAKING CARE OF THIS PATIENT: 45 minutes.   Note: This dictation was prepared with Dragon dictation along with smaller phrase technology. Any transcriptional errors that result from this process are unintentional.   @[facility] @  on 01/02/2017 at 10:24 AM  Between 7am to 6pm - Pager - [phone]  After 6pm go to [email or web address] - password [email or web address] [facility]  [facility]  Office  [phone]  CC: Primary care physician; Nyra MarketSvalina, Gorica, MD

## 2017-01-02 NOTE — Consult Note (Signed)
NEURO HOSPITALIST CONSULT NOTE   Requestig physician: Dr. Heide SparkNarendra   Reason for Consult: Plasma exchange    History obtained from:  Patient   Chart    HPI:                                                                                                                                          Debra [name] is an 51 y.o. female diagnosis of MS.  Patient did receive 5 days of Solu-Medrol in the past with a recent diagnosis but did not have any improvement.  Per consult note and progress note by Dr. Rosiland Ozeynolds [facility] neurologist , she reports that her lower extremity weakness was progressive for months prior to admission.  And over the last 2 weeks prior to admission she was having bowel and bladder incontinence along with blurred vision.  While at [facility] patient reported that her vision was getting worse and now we will did not have the ability to read.  She also reported that she could not stand and was unable to feed herself.  Patient had a repeat MRI of the brain with contrast and did show active demyelinating disease.  For this reason and the vision problems could not rule out possible NMO.  Given the fact that the patient was not improving with Solu-Medrol patient [facility].  Past Medical History:  Diagnosis Date  . Adenomatous colonic polyps   . Anemia    2005  . Anxiety    1990  . Arthritis   . Asthma    2000  . Cataract   . Depression 1990  . Difficult intubation    narrow airway  . Gastroesophageal Reflux Disease (GERD)   . Heart murmur    Birth  . Hyperlipidemia    2005  . Hypertension    1998  . Internal hemorrhoids 04/24/06   on colonoscopy  . Migraine   . Neuropathy of the hands & feet   . Restless Leg Syndrome   . Right Ankle Fracture 10/06/2013  . Sleep paralysis   . Stable angina pectoris    2007: cath showing normal cors.   . Stroke 1990   . Type I Diabetes Mellitus 1988    Past Surgical History:  Procedure Laterality Date  . bilateral foot surgery    . BREAST SURGERY Left    biopsy left breast  . CARDIAC CATHETERIZATION  2007   around 2007 or 2008  . CESAREAN SECTION    . CHOLECYSTECTOMY    . COLONOSCOPY W/ BIOPSIES AND POLYPECTOMY    . ENDOMETRIAL ABLATION W/ NOVASURE N/A 12/2009  . EYE SURGERY    . TRIGGER FINGER RELEASE     x 3  Family History  Problem Relation Age of Onset  . Hypertension Mother   . Kidney disease Mother   . Hypertension Father   . Breast cancer Maternal Grandmother   . Prostate cancer Maternal Grandfather   . Ovarian cancer Paternal Grandmother   . Prostate cancer Paternal Grandfather   . Colon cancer Maternal Uncle        Family history of malignant neoplasm of gastrointestinal tract     Social History:  reports that  has never smoked. she has never used smokeless tobacco. She reports that she does not drink alcohol or use drugs.  Allergies  Allergen Reactions  . Penicillins Anaphylaxis, Nausea And Vomiting and Rash    Has patient had a PCN reaction causing immediate rash, facial/tongue/throat swelling, SOB or lightheadedness with hypotension: Yes Has patient had a PCN reaction causing severe rash involving mucus membranes or skin necrosis: Yes Has patient had a PCN reaction that required hospitalization No Has patient had a PCN reaction occurring within the last 10 years: Yes If all of the above answers are "NO", then may proceed with Cephalosporin use.   . Pollen Extract     Seasonal Allergies  . Tape Rash    MEDICATIONS:                                                                                                                     Prior to Admission:  Medications Prior to Admission  Medication Sig Dispense Refill Last Dose  . [START ON 01/03/2017] amLODipine (NORVASC) 5 MG tablet Take 1 tablet (5 mg total) daily by mouth.     . bethanechol (URECHOLINE) 5 MG tablet  Take 1 tablet (5 mg total) 3 (three) times daily by mouth.     . 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 The patient tests 4 times per day. 1 Device 0 12/25/2016  . ciprofloxacin (CIPRO) 500 MG tablet Take 1 tablet (500 mg total) 2 (two) times daily by mouth.     [name] ON 01/03/2017] DULoxetine (CYMBALTA) 60 MG capsule Take 1 capsule (60 mg total) daily by mouth.  3   . gabapentin (NEURONTIN) 100 MG capsule Take 1 capsule (100 mg total) 3 (three) times daily by mouth.     . insulin aspart (NOVOLOG) 100 UNIT/ML injection Inject 0-15 Units 3 (three) times daily with meals into the skin. 10 mL 11   . insulin aspart (NOVOLOG) 100 UNIT/ML injection Inject 8 Units 3 (three) times daily with meals into the skin. 10 mL 11   . insulin glargine (LANTUS) 100 UNIT/ML injection Inject 0.15 mLs (15 Units total) 2 (two) times daily into the skin. 10 mL 11   . risperiDONE (RISPERDAL) 1 MG tablet Take 1 tablet (1 mg total) at bedtime by mouth.     [name] ON 01/03/2017] tamsulosin (FLOMAX) 0.4 MG CAPS capsule Take 1 capsule (0.4 mg total) daily by mouth. 30 capsule  Scheduled: . amLODipine  10 mg Oral Daily  . aspirin EC  81 mg Oral Daily  . atorvastatin  40 mg Oral Daily  . bethanechol  50 mg Oral TID  . escitalopram  5 mg Oral Daily  . gabapentin  600 mg Oral TID  . hydrALAZINE  75 mg Oral Q8H  . insulin aspart  10 Units Subcutaneous TID WC  . insulin glargine  17 Units Subcutaneous BID  . lisinopril  20 mg Oral BID  . meloxicam  15 mg Oral q morning - 10a  . metoprolol succinate  75 mg Oral Daily  . pantoprazole  40 mg Oral Daily  . traZODone  50 mg Oral QHS   Continuous:    ROS:                                                                                                                                       History obtained from the patient  General ROS: negative for - chills, fatigue, fever, night sweats,  weight gain or weight loss Psychological ROS: negative for - behavioral disorder, hallucinations, memory difficulties, mood swings or suicidal ideation Ophthalmic ROS: positive for - blurry vision,  loss of vision ENT ROS: negative for - epistaxis, nasal discharge, oral lesions, sore throat, tinnitus or vertigo Allergy and Immunology ROS: negative for - hives or itchy/watery eyes Hematological and Lymphatic ROS: negative for - bleeding problems, bruising or swollen lymph nodes Endocrine ROS: negative for - galactorrhea, hair pattern changes, polydipsia/polyuria or temperature intolerance Respiratory ROS: negative for - cough, hemoptysis, shortness of breath or wheezing Cardiovascular ROS: negative for - chest pain, dyspnea on exertion, edema or irregular heartbeat Gastrointestinal ROS: negative for - abdominal pain, diarrhea, hematemesis, nausea/vomiting or stool incontinence Genito-Urinary ROS: negative for - dysuria, hematuria, incontinence or urinary frequency/urgency Musculoskeletal ROS: positive for -muscular weakness Neurological ROS: as noted in HPI Dermatological ROS: negative for rash and skin lesion changes   Blood pressure (!) 169/79, pulse 82, temperature 99.6 F (37.6 C), temperature source Oral, resp. rate 18, SpO2 94 %.   Neurologic Examination:                                                                                                      HEENT-  Normocephalic, no lesions, without obvious abnormality.  Normal external eye and conjunctiva.  Normal TM's bilaterally.  Normal auditory canals and external ears. Normal external nose, mucus membranes and septum.  Normal pharynx. Cardiovascular- S1, S2 normal, pulses  palpable throughout   Lungs- chest clear, no wheezing, rales, normal symmetric air entry Abdomen- normal findings: bowel sounds normal Extremities- no edema Lymph-no adenopathy palpable Musculoskeletal-no joint tenderness, deformity or swelling Skin-warm and dry,  no hyperpigmentation, vitiligo, or suspicious lesions  Neurological Examination Mental Status: Alert, oriented, thought content appropriate.  Speech fluent without evidence of aphasia.  Able to follow 3 step commands without difficulty. Cranial Nerves: II: Decreased vision with right eye being 20/400 and left eye being 20/200.  Patient is able to count fingers and recognize colors III,IV, VI: ptosis not present, extra-ocular motions intact bilaterally pupils equal, round, minimally reactive to light and accommodation V,VII: smile symmetric, facial light touch sensation normal bilaterally VIII: hearing normal bilaterally IX,X: uvula rises symmetrically XI: bilateral shoulder shrug XII: midline tongue extension Motor: Right : Upper extremity   5/5    Left:     Upper extremity   5/5  Lower extremity   3/5     Lower extremity   3/5 Tone and bulk:normal tone throughout; no atrophy noted Sensory: Pinprick and light touch intact throughout, bilaterally Deep Tendon Reflexes: 2+ and symmetric throughout upper extremities with no knee jerk or ankle jerk Plantars: Right: downgoing   Left: downgoing Cerebellar: normal finger-to-nose,  Gait: Not tested      Lab Results: Basic Metabolic Panel: Recent Labs  Lab 01/02/17 0410  NA 133*  K 4.4  CL 96*  CO2 28  GLUCOSE 345*  BUN 44*  CREATININE 2.29*  CALCIUM 8.6*    Liver Function Tests: No results for input(s): AST, ALT, ALKPHOS, BILITOT, PROT, ALBUMIN in the last 168 hours. No results for input(s): LIPASE, AMYLASE in the last 168 hours. No results for input(s): AMMONIA in the last 168 hours.  CBC: Recent Labs  Lab 01/02/17 0410  WBC 10.0  HGB 11.4*  HCT 34.7*  MCV 90.5  PLT 231    Cardiac Enzymes: No results for input(s): CKTOTAL, CKMB, CKMBINDEX, TROPONINI in the last 168 hours.  Lipid Panel: No results for input(s): CHOL, TRIG, HDL, CHOLHDL, VLDL, LDLCALC in the last 168 hours.  CBG: Recent Labs  Lab  01/01/17 1316 01/01/17 1705 01/01/17 2115 01/02/17 0737 01/02/17 1210  GLUCAP 211* 337* 266* 315* 221*    Microbiology: Results for orders placed or performed during the hospital encounter of 11/22/16  Culture, Urine     Status: Abnormal   Collection Time: 12/06/16  8:50 AM  Result Value Ref Range Status   Specimen Description URINE, CATHETERIZED  Final   Special Requests NONE  Final   Culture >=100,000 COLONIES/mL ESCHERICHIA COLI (A)  Final   Report Status 12/08/2016 FINAL  Final   Organism ID, Bacteria ESCHERICHIA COLI (A)  Final      Susceptibility   Escherichia coli - MIC*    AMPICILLIN 4 SENSITIVE Sensitive     CEFAZOLIN <=4 SENSITIVE Sensitive     CEFTRIAXONE <=1 SENSITIVE Sensitive     CIPROFLOXACIN <=0.25 SENSITIVE Sensitive     GENTAMICIN <=1 SENSITIVE Sensitive     IMIPENEM <=0.25 SENSITIVE Sensitive     NITROFURANTOIN <=16 SENSITIVE Sensitive     TRIMETH/SULFA <=20 SENSITIVE Sensitive     AMPICILLIN/SULBACTAM <=2 SENSITIVE Sensitive     PIP/TAZO <=4 SENSITIVE Sensitive     Extended ESBL NEGATIVE Sensitive     * >=100,000 COLONIES/mL ESCHERICHIA COLI    Coagulation Studies: No results for input(s): LABPROT, INR in the last 72 hours.  Imaging: Mr Laqueta Jean XL Contrast  Result Date: 12/31/2016  CLINICAL DATA:  Recent diagnosis of multiple sclerosis without significant improvement following Solu-Medrol. EXAM: MRI HEAD WITHOUT AND WITH CONTRAST TECHNIQUE: Multiplanar, multiecho pulse sequences of the brain and surrounding structures were obtained without and with intravenous contrast. CONTRAST:  31mL MULTIHANCE GADOBENATE DIMEGLUMINE 529 MG/ML IV SOLN COMPARISON:  MRI brain 11/21/2016 at [facility]. FINDINGS: Brain: Multiple periventricular and subcortical T2 hyperintensities are again noted. There is diffuse involvement of the colossal septal margin. Mild restricted diffusion is again seen. There is faint enhancement of these areas. There is prominent extension  into the anterior corona radiata bilaterally. Involvement of the genu of the corpus callosum is new. There is restricted diffusion of this lesion. The lesion does not significantly enhance. No other new lesions are present. T2 changes extending into the brainstem are stable. No acute hemorrhage or mass lesion is present. The ventricles are of normal size. No significant extra-axial fluid collection is present. Vascular: Flow is present in the major intracranial arteries. Skull and upper cervical spine: The skullbase is within normal limits. Midline scratched at the craniocervical junction is normal. Midline sagittal structures are otherwise unremarkable. Sinuses/Orbits: Progressive diffuse sinus disease is present. Fluid levels are present in the maxillary sinuses and sphenoid sinuses bilaterally. Extensive mucosal thickening is present. There is diffuse opacification of ethmoid air cells. Mild mucosal thickening is present within hypoplastic frontal sinuses. The globes and orbits are within normal limits. IMPRESSION: 1. New T2 hyperintense lesion within the genu of the corpus callosum with restricted diffusion compatible with acute demyelination. 2. Multiple other periventricular T2 hyperintensities with diffuse involvement of the colossal septal margin and extension into the corona radiata bilaterally compatible with a demyelinating process. Multiple lesions demonstrate active demyelination evidence by restricted diffusion and subtle enhancement. 3. Progression of diffuse sinusitis. Electronically Signed   By: Marin Roberts M.D.   On: 12/31/2016 18:17       Assessment and plan per attending neurologist  Felicie Morn PA-C [facility] [phone]  01/02/2017, 12:45 PM   NEUROHOSPITALIST ADDENDUM Seen and examined the patient this AM. Formulated plan as documented above. Recommendations as above. Will follow  ASSESSMENT  This is a 50 year old female with recent diagnosis of MS  who presents with a relapse. MRI shows multiple new enhancing lesions. The patient has recently received coarse of IV steroids with no response. She was transferred from [facility] to [facility] to receive plasmapheresis. Studies have shown plasmapheresis has been beneficial in treatment of refractory MS. Also there is a concern the patient may have NMO which also responds to plasmapheresis.  PLAN Continue 1g Methylprednisone tonight Will arrange for plasmapheresis starting tomorrow Patient will need LP to check for oligoclonal bands, NMO ab   Georgiana Spinner Eisen Robenson MD [facility] [phone]  If 7pm to 7am, please call on call as listed on AMION.

## 2017-01-02 NOTE — Progress Notes (Signed)
Recreation Therapy Notes  INPATIENT RECREATION TR PLAN  Patient Details Name: Debra [name] MRN: [id number] DOB: 02/27/1966 Today's Date: 01/02/2017  Rec Therapy Plan Is patient appropriate for Therapeutic Recreation?: Yes Treatment times per week: at least 3 Estimated Length of Stay: 5-7 days TR Treatment/Interventions: Group participation (Comment)(Appropriate participation in recreation therapy tx.)  Discharge Criteria Pt will be discharged from therapy if:: Discharged Treatment plan/goals/alternatives discussed and agreed upon by:: Patient/family  Discharge Summary Short term goals set: Patient will attend and participate in at least 3 Recreation Therapy Group Sessions  Short term goals met: Not met Progress toward goals comments: Groups attended Which groups?: Other (Comment)(Teambuilding) Reason goals not met: Patient only attened one group  Therapeutic equipment acquired: N/A Reason patient discharged from therapy: Discharge from hospital Pt/family agrees with progress & goals achieved: Yes Date patient discharged from therapy: 01/01/17   Jordyn Doane 01/02/2017, 11:30 AM

## 2017-01-02 NOTE — Discharge Instructions (Signed)
Transfer patient to [facility] hospitalist Dr. Kirby Funk

## 2017-01-02 NOTE — Consult Note (Signed)
[facility]   Reason for Consult:  Suicide risk assessment Referring Physician:  Dr. Heide SparkNarendra Patient Identification: Debra LacrosseCheryl Lynette [name] MRN:  [id number] Principal Diagnosis: Major depressive disorder, single episode, severe without psychosis (HCC) Diagnosis:   Patient Active Problem List   Diagnosis Date Noted  . Major depressive disorder, single episode, severe without psychosis (HCC) [F32.2] 01/02/2017  . Multiple sclerosis exacerbation (HCC) [G35] 01/01/2017  . Dysthymia [F34.1] 12/27/2016  . Major depressive disorder, recurrent, severe with psychotic features (HCC) [F33.3]   . Multiple joint pain [M25.50]   . HTN (hypertension) [I10]   . Acute pain of left knee [M25.562]   . Neurogenic bladder [N31.9]   . Acute lower UTI [N39.0]   . Labile blood pressure [R09.89]   . Acute blood loss anemia [D62]   . Leukocytosis [D72.829]   . Labile blood glucose [R73.09]   . Hypertensive crisis [I16.9]   . Hypoglycemia [E16.2]   . Poorly controlled type 2 diabetes mellitus with peripheral neuropathy (HCC) [E11.42, E11.65]   . Uncontrolled hypertension [I10]   . Sleep disturbance [G47.9]   . Multiple sclerosis (HCC) [G35] 11/22/2016  . Thoracic root lesion [G54.3] 11/22/2016  . MS (multiple sclerosis) (HCC) [G35]   . Neuropathic pain [M79.2]   . Uncontrolled type 2 diabetes mellitus with peripheral neuropathy (HCC) [E11.42, E11.65]   . Urinary retention [R33.9]   . Medically noncompliant [Z91.19]   . Leg weakness, bilateral [R29.898] 11/20/2016  . Diabetic ketoacidosis without coma associated with type 1 diabetes mellitus (HCC) [E10.10]   . Leg pain [M79.606]   . History of CVA (cerebrovascular accident) [Z86.73]   . 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 Benign essential HTN [I10]   . Hypertensive urgency [I16.0]   . AKI (acute kidney injury) (HCC) [N17.9]   . Fibromyalgia [M79.7] 11/07/2016  .  Back pain [M54.9] 10/31/2016  . Stable angina pectoris (HCC) [I20.8]   . Vitamin D deficiency [E55.9] 08/31/2014  . 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 Hyperlipidemia [E78.5] 10/06/2013  . Depression [F32.9] 10/16/2012  . Bilateral knee pain [M25.561, M25.562] 02/16/2012  . Chronic diarrhea [K52.9] 10/19/2010  . Peripheral neuropathy [G62.9] 08/19/2010  . Type 1 diabetes mellitus (HCC) [E10.9] 07/06/2010  . Hypertension [I10] 07/06/2010  . Asthma [J45.909] 07/06/2010  . Migraine [G43.909] 07/06/2010    Total Time spent with patient: 1 hour  Subjective:   Debra [name] is a 51 y.o. female patient admitted with acute MS from [facility] for plasma exchange treatment due to worsening vision that was not improved with Solu-Medrol. Psychiatry was consulted for SI.   HPI:   Debra [name] was last seen in the [facility] on 10/26 by this notewriter. She reported chronic depression and SI without intent to harm self. She had worsening suicidal thoughts over the past month due to a recent diagnosis of MS. She was recommended for inpatient psychiatric hospitalization. Today she reports that she has been at [facility] where Lexapro 5 mg daily and Cymbalta 60 mg daily were started. She reports that there was no plan for discharge but she needed to be transferred [facility] for complications related to MS. She denies any improvement in her mood since starting these medications and denies any improvement in SI. She endorses anxiety/worry, feelings of hopelessness and helplessness. She does not enjoy cooking and reading anymore. She reports poor to fair appetite and poor sleep. She occasionally  hears voices although they are muffled so she is unable to make out what they are saying. She denies paranoia, HI, prior suicide attempts or psychiatric hospitalizations. She denies problems with concentration. She denies side effects from her psychotropic  medications.   Past Psychiatric History: Depression  Risk to Self:  Yes. Endorses SI. Risk to Others:  None. Denies HI. Prior Inpatient Therapy:  She was transferred from [facility] to [facility] for medical treatment. She had been there since 10/26.  Prior Outpatient Therapy:  None currently.   Past Medical History:  Past Medical History:  Diagnosis Date  . Adenomatous colonic polyps   . Anemia    2005  . Anxiety    1990  . Arthritis   . Asthma    2000  . Cataract   . Depression 1990  . Difficult intubation    narrow airway  . Gastroesophageal Reflux Disease (GERD)   . Heart murmur    Birth  . Hyperlipidemia    2005  . Hypertension    1998  . Internal hemorrhoids 04/24/06   on colonoscopy  . Migraine   . Neuropathy of the hands & feet   . Restless Leg Syndrome   . Right Ankle Fracture 10/06/2013  . Sleep paralysis   . Stable angina pectoris    2007: cath showing normal cors.   . Stroke 1990  . Type I Diabetes Mellitus 1988    Past Surgical History:  Procedure Laterality Date  . bilateral foot surgery    . BREAST SURGERY Left    biopsy left breast  . CARDIAC CATHETERIZATION  2007   around 2007 or 2008  . CESAREAN SECTION    . CHOLECYSTECTOMY    . COLONOSCOPY W/ BIOPSIES AND POLYPECTOMY    . ENDOMETRIAL ABLATION W/ NOVASURE N/A 12/2009  . EYE SURGERY    . TRIGGER FINGER RELEASE     x 3   Family History:  Family History  Problem Relation Age of Onset  . Hypertension Mother   . Kidney disease Mother   . Hypertension Father   . Breast cancer Maternal Grandmother   . Prostate cancer Maternal Grandfather   . Ovarian cancer Paternal Grandmother   . Prostate cancer Paternal Grandfather   . Colon cancer Maternal Uncle        Family history of malignant neoplasm of gastrointestinal tract   Family Psychiatric  History: Denies  Social History:  Social History   Substance and Sexual Activity  Alcohol Use No  . Alcohol/week: 0.0 oz     Social History    Substance and Sexual Activity  Drug Use No   Comment: drug addict    Social History   Socioeconomic History  . Marital status: Divorced    Spouse name: Not on file  . Number of children: 2  . Years of education: 39  . Highest education level: Not on file  Social Needs  . Financial resource strain: Not on file  . Food insecurity - worry: Not on file  . Food insecurity - inability: Not on file  . Transportation needs - medical: Not on file  . Transportation needs - non-medical: Not on file  Occupational History  . Occupation: Unemployed  Tobacco Use  . Smoking status: Never Smoker  . Smokeless tobacco: Never Used  Substance and Sexual Activity  . Alcohol use: No    Alcohol/week: 0.0 oz  . Drug use: No    Comment: drug addict  . Sexual activity: Yes  Birth control/protection: Other-see comments    Comment: pt states she had ablation in 2011  Other Topics Concern  . Not on file  Social History Narrative   Lost medicaid about 2009 ish when her youngest child turned 39. Has adult children. Lives with boyfriend who financially supports her. Has attempted to get disability but has been turned down.      2-3 caffeine drinks daily    Additional Social History: She reports living at home alone. She last reported being homeless at last admission to [facility]. 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 She has 2 grown children. She is divorced. She denies alcohol, illicit drug or tobacco use.      Allergies:   Allergies  Allergen Reactions  . Penicillins Anaphylaxis, Nausea And Vomiting and Rash    Has patient had a PCN reaction causing immediate rash, facial/tongue/throat swelling, SOB or lightheadedness with hypotension: Yes Has patient had a PCN reaction causing severe rash involving mucus membranes or skin necrosis: Yes Has patient had a PCN reaction that required hospitalization No Has patient had a PCN reaction occurring  within the last 10 years: Yes If all of the above answers are "NO", then may proceed with Cephalosporin use.   . Pollen Extract     Seasonal Allergies  . Tape Rash    Labs:  Results for orders placed or performed during the hospital encounter of 01/02/17 (from the past 48 hour(s))  Glucose, capillary     Status: Abnormal   Collection Time: 01/02/17 12:10 PM  Result Value Ref Range   Glucose-Capillary 221 (H) 65 - 99 mg/dL   Comment 1 Notify RN    Comment 2 Document in Chart     Current Facility-Administered Medications  Medication Dose Route Frequency Provider Last Rate Last Dose  . acetaminophen (TYLENOL) tablet 650 mg  650 mg Oral Q4H PRN Charm Rings, NP      . albuterol (PROVENTIL) (2.5 MG/3ML) 0.083% nebulizer solution 2.5 mg  2.5 mg Inhalation Q4H PRN Charm Rings, NP      . alum & mag hydroxide-simeth (MAALOX/MYLANTA) 200-200-20 MG/5ML suspension 30 mL  30 mL Oral Q4H PRN Charm Rings, NP      . alum & mag hydroxide-simeth (MAALOX/MYLANTA) 200-200-20 MG/5ML suspension 30 mL  30 mL Oral Q6H PRN Charm Rings, NP      . amLODipine (NORVASC) tablet 10 mg  10 mg Oral Daily [name], MD      . aspirin EC tablet 81 mg  81 mg Oral Daily Narendra, Nischal, MD      . atorvastatin (LIPITOR) tablet 40 mg  40 mg Oral Daily Narendra, Nischal, MD      . bethanechol (URECHOLINE) tablet 50 mg  50 mg Oral TID Charm Rings, NP      . escitalopram (LEXAPRO) tablet 5 mg  5 mg Oral Daily Lord, Jamison Y, NP      . gabapentin (NEURONTIN) capsule 600 mg  600 mg Oral TID Charm Rings, NP      . heparin injection 5,000 Units  5,000 Units Subcutaneous Q8H Deneise Lever, MD      . hydrALAZINE (APRESOLINE) tablet 75 mg  75 mg Oral Q8H Lord, Jamison Y, NP      . insulin aspart (novoLOG) injection 10 Units  10 Units Subcutaneous TID WC Narendra, Nischal, MD      . insulin glargine (LANTUS) injection 17 Units  17 Units Subcutaneous BID [name], MD      . lisinopril  (PRINIVIL,ZESTRIL) tablet 20 mg  20 mg Oral BID Heide Spark, Nischal, MD      . magnesium hydroxide (MILK OF MAGNESIA) suspension 30 mL  30 mL Oral Daily PRN Shaune Pollack, Herminio Heads, NP      . meloxicam (MOBIC) tablet 15 mg  15 mg Oral q morning - 10a Narendra, Nischal, MD      . metoprolol succinate (TOPROL-XL) 24 hr tablet 75 mg  75 mg Oral Daily Narendra, Nischal, MD      . ondansetron (ZOFRAN) tablet 4 mg  4 mg Oral Q8H PRN Charm Rings, NP      . pantoprazole (PROTONIX) EC tablet 40 mg  40 mg Oral Daily Narendra, Nischal, MD      . sodium chloride 0.9 % bolus 1,000 mL  1,000 mL Intravenous Once Deneise Lever, MD      . sodium chloride flush (NS) 0.9 % injection 3 mL  3 mL Intravenous Q12H Deneise Lever, MD      . traZODone (DESYREL) tablet 50 mg  50 mg Oral QHS Charm Rings, NP      . zolpidem (AMBIEN) tablet 5 mg  5 mg Oral QHS PRN Charm Rings, NP        Musculoskeletal: Strength & Muscle Tone: Bilateral lower extremity weakness. Gait & Station: UTA since patient was lying in bed. Patient leans: N/A  Psychiatric Specialty Exam: Physical Exam  Nursing note and vitals reviewed. Constitutional: She is oriented to person, place, and time. She appears well-developed and well-nourished.  HENT:  Head: Normocephalic and atraumatic.  Neck: Normal range of motion.  Respiratory: Effort normal.  Musculoskeletal: Normal range of motion.  Neurological: She is alert and oriented to person, place, and time.  Skin: No rash noted.  Psychiatric: Her behavior is normal. Judgment and thought content normal.    Review of Systems  Constitutional: Negative for chills and fever.  Eyes: Positive for blurred vision.  Gastrointestinal: Negative for constipation, diarrhea, nausea and vomiting.  Genitourinary:       Bladder and bowel incontinence.   Neurological:       Bilateral lower extremity weakness.   Psychiatric/Behavioral: Positive for depression, hallucinations (Occasional AH of muffled  voices.) and suicidal ideas. Negative for memory loss and substance abuse. The patient is nervous/anxious and has insomnia.     Blood pressure (!) 189/84, pulse 78, temperature 99.2 F (37.3 C), temperature source Oral, resp. rate 16, SpO2 99 %.There is no height or weight on file to calculate BMI.  General Appearance: Well Groomed and middle aged, African American female with corrective lenses and a hospital gown. NAD.  Eye Contact:  Good  Speech:  Clear and Coherent and Normal Rate  Volume:  Normal  Mood:  Depressed  Affect:  Congruent and Depressed  Thought Process:  Goal Directed and Linear  Orientation:  Full (Time, Place, and Person)  Thought Content:  Logical  Suicidal Thoughts:  Yes.  without intent/plan  Homicidal Thoughts:  No  Memory:  Immediate;   Good Recent;   Good Remote;   Good  Judgement:  Good  Insight:  Good  Psychomotor Activity:  Normal  Concentration:  Concentration: Good and Attention Span: Good  Recall:  Good  Fund of Knowledge:  Good  Language:  Good  Akathisia:  No  Handed:  Right  AIMS (if indicated):   N/A  Assets:  Communication Skills Housing  ADL's:  Intact  Cognition:  WNL  Sleep:   Poor    Assessment:  Sapphyre Monasmith is a 51 y.o. female who was admitted with acute MS from [facility] for plasma exchange treatment due to worsening vision that was not improved with Solu-Medrol. Psychiatry was consulted for SI. She continues to endorse SI with depressive symptoms that have not improved with recent psychiatric hospitalization at [facility]. Recommend discontinuing Lexapro since it is at a low dose and not likely having any benefit and starting Remeron for sleep, depression and anxiety. Patient will require inpatient psychiatric hospitalization following medical clearance given continued suicidal thoughts. If her mood improves and she denies SI with treatment then she can be reassessed for suicide risk prior to discharge.    Treatment Plan  Summary: -Continue bedside sitter due to ongoing SI and risk of harm to self.  -Continue Cymbalta 60 mg daily for depression. -Recommend discontinuing Lexapro and starting Remeron 7.5 mg qhs for sleep, anxiety and depression. Can titrate as indicated for symptom management.  -Recommend discontinuing Risperdal since patient does not appear to be psychotic. 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  -Patient requires inpatient psychiatric hospitalization following medical clearance given continued SI. Can reassess patient for suicide risk if patient improves and no longer suicidal when ready for discharge.    Disposition: Recommend psychiatric Inpatient admission when medically cleared.  Debra Beach, DO 01/02/2017 3:12 PM

## 2017-01-02 NOTE — Progress Notes (Signed)
Patient being discharged to [facility] for Plasmapheresis. Discussed patient safety with patient regarding calling for assistance with ambulation. Also discussed side effects of influenza vaccine, patient verbalized understanding. Patient transported by care link with sitter at bedside. Sitter will drive personal transportation to [facility] and meet patient at facility.

## 2017-01-03 ENCOUNTER — Encounter (HOSPITAL_COMMUNITY): Payer: Self-pay

## 2017-01-03 ENCOUNTER — Inpatient Hospital Stay (HOSPITAL_COMMUNITY): Payer: Medicaid Other

## 2017-01-03 DIAGNOSIS — R32 Unspecified urinary incontinence: Secondary | ICD-10-CM

## 2017-01-03 DIAGNOSIS — I10 Essential (primary) hypertension: Secondary | ICD-10-CM

## 2017-01-03 DIAGNOSIS — H538 Other visual disturbances: Secondary | ICD-10-CM

## 2017-01-03 DIAGNOSIS — E1165 Type 2 diabetes mellitus with hyperglycemia: Secondary | ICD-10-CM

## 2017-01-03 DIAGNOSIS — F332 Major depressive disorder, recurrent severe without psychotic features: Secondary | ICD-10-CM

## 2017-01-03 DIAGNOSIS — F322 Major depressive disorder, single episode, severe without psychotic features: Secondary | ICD-10-CM

## 2017-01-03 DIAGNOSIS — Z79899 Other long term (current) drug therapy: Secondary | ICD-10-CM

## 2017-01-03 LAB — PROTEIN AND GLUCOSE, CSF
Glucose, CSF: 166 mg/dL — ABNORMAL HIGH (ref 40–70)
Total  Protein, CSF: 94 mg/dL — ABNORMAL HIGH (ref 15–45)

## 2017-01-03 LAB — GLUCOSE, CAPILLARY
GLUCOSE-CAPILLARY: 321 mg/dL — AB (ref 65–99)
GLUCOSE-CAPILLARY: 269 mg/dL — AB (ref 65–99)
Glucose-Capillary: 284 mg/dL — ABNORMAL HIGH (ref 65–99)

## 2017-01-03 LAB — POCT I-STAT, CHEM 8
BUN: 35 mg/dL — AB (ref 6–20)
CHLORIDE: 97 mmol/L — AB (ref 101–111)
Calcium, Ion: 1.21 mmol/L (ref 1.15–1.40)
Creatinine, Ser: 1.7 mg/dL — ABNORMAL HIGH (ref 0.44–1.00)
Glucose, Bld: 212 mg/dL — ABNORMAL HIGH (ref 65–99)
HCT: 32 % — ABNORMAL LOW (ref 36.0–46.0)
Hemoglobin: 10.9 g/dL — ABNORMAL LOW (ref 12.0–15.0)
POTASSIUM: 3.7 mmol/L (ref 3.5–5.1)
SODIUM: 139 mmol/L (ref 135–145)
TCO2: 29 mmol/L (ref 22–32)

## 2017-01-03 LAB — CBC
HEMATOCRIT: 33.6 % — AB (ref 36.0–46.0)
Hemoglobin: 10.8 g/dL — ABNORMAL LOW (ref 12.0–15.0)
MCH: 29.1 pg (ref 26.0–34.0)
MCHC: 32.1 g/dL (ref 30.0–36.0)
MCV: 90.6 fL (ref 78.0–100.0)
Platelets: 230 10*3/uL (ref 150–400)
RBC: 3.71 MIL/uL — AB (ref 3.87–5.11)
RDW: 14.3 % (ref 11.5–15.5)
WBC: 8.9 10*3/uL (ref 4.0–10.5)

## 2017-01-03 LAB — COMPREHENSIVE METABOLIC PANEL
ALBUMIN: 2.8 g/dL — AB (ref 3.5–5.0)
ALK PHOS: 71 U/L (ref 38–126)
ALT: 18 U/L (ref 14–54)
AST: 17 U/L (ref 15–41)
Anion gap: 9 (ref 5–15)
BILIRUBIN TOTAL: 0.7 mg/dL (ref 0.3–1.2)
BUN: 39 mg/dL — AB (ref 6–20)
CO2: 26 mmol/L (ref 22–32)
Calcium: 8.8 mg/dL — ABNORMAL LOW (ref 8.9–10.3)
Chloride: 99 mmol/L — ABNORMAL LOW (ref 101–111)
Creatinine, Ser: 1.83 mg/dL — ABNORMAL HIGH (ref 0.44–1.00)
GFR calc Af Amer: 36 mL/min — ABNORMAL LOW (ref >60)
GFR calc non Af Amer: 31 mL/min — ABNORMAL LOW (ref >60)
GLUCOSE: 350 mg/dL — AB (ref 65–99)
POTASSIUM: 4.4 mmol/L (ref 3.5–5.1)
Sodium: 134 mmol/L — ABNORMAL LOW (ref 135–145)
TOTAL PROTEIN: 6.2 g/dL — AB (ref 6.5–8.1)

## 2017-01-03 LAB — CSF CELL COUNT WITH DIFFERENTIAL
RBC Count, CSF: 155 /mm3 — ABNORMAL HIGH
TUBE #: 1
WBC, CSF: 3 /mm3 (ref 0–5)

## 2017-01-03 MED ORDER — ACD FORMULA A 0.73-2.45-2.2 GM/100ML VI SOLN
500.0000 mL | Status: DC
  Administered 2017-01-03: 500 mL via INTRAVENOUS

## 2017-01-03 MED ORDER — ACD FORMULA A 0.73-2.45-2.2 GM/100ML VI SOLN
Status: AC
  Administered 2017-01-03: 500 mL via INTRAVENOUS
  Filled 2017-01-03: qty 500

## 2017-01-03 MED ORDER — SODIUM CHLORIDE 0.9% FLUSH
10.0000 mL | INTRAVENOUS | Status: DC | PRN
  Administered 2017-01-04 – 2017-01-10 (×4): 10 mL
  Administered 2017-02-04: 3 mL
  Administered 2017-02-11: 10 mL
  Filled 2017-01-03 (×7): qty 40

## 2017-01-03 MED ORDER — CALCIUM CARBONATE ANTACID 500 MG PO CHEW
2.0000 | CHEWABLE_TABLET | ORAL | Status: AC
  Administered 2017-01-03: 400 mg via ORAL
  Filled 2017-01-03: qty 2

## 2017-01-03 MED ORDER — CALCIUM CARBONATE ANTACID 500 MG PO CHEW
CHEWABLE_TABLET | ORAL | Status: AC
  Administered 2017-01-03: 400 mg via ORAL
  Filled 2017-01-03: qty 2

## 2017-01-03 MED ORDER — SODIUM CHLORIDE 0.9 % IV SOLN
Freq: Once | INTRAVENOUS | Status: DC
  Filled 2017-01-03: qty 200

## 2017-01-03 MED ORDER — ACETAMINOPHEN 325 MG PO TABS
650.0000 mg | ORAL_TABLET | ORAL | Status: DC | PRN
  Filled 2017-01-03: qty 2

## 2017-01-03 MED ORDER — ACD FORMULA A 0.73-2.45-2.2 GM/100ML VI SOLN
500.0000 mL | Status: DC
  Administered 2017-01-03: 500 mL via INTRAVENOUS
  Filled 2017-01-03: qty 500

## 2017-01-03 MED ORDER — SODIUM CHLORIDE 0.9 % IV SOLN
INTRAVENOUS | Status: AC
  Administered 2017-01-03 (×3): via INTRAVENOUS_CENTRAL
  Filled 2017-01-03 (×2): qty 150
  Filled 2017-01-03: qty 100

## 2017-01-03 MED ORDER — HEPARIN SODIUM (PORCINE) 5000 UNIT/ML IJ SOLN
5000.0000 [IU] | Freq: Three times a day (TID) | INTRAMUSCULAR | Status: DC
  Administered 2017-01-04 – 2017-01-10 (×18): 5000 [IU] via SUBCUTANEOUS
  Filled 2017-01-03 (×19): qty 1

## 2017-01-03 MED ORDER — HEPARIN SODIUM (PORCINE) 1000 UNIT/ML IJ SOLN: 1000.0000 [IU] | Freq: Once | INTRAMUSCULAR | Status: DC

## 2017-01-03 MED ORDER — ACETAMINOPHEN 325 MG PO TABS: 650.0000 mg | ORAL_TABLET | ORAL | Status: DC | PRN

## 2017-01-03 MED ORDER — SODIUM CHLORIDE 0.9 % IV SOLN: 2.0000 g | Freq: Once | INTRAVENOUS | Status: DC

## 2017-01-03 MED ORDER — CALCIUM CARBONATE ANTACID 500 MG PO CHEW: 2.0000 | CHEWABLE_TABLET | ORAL | Status: DC

## 2017-01-03 MED ORDER — HEPARIN SODIUM (PORCINE) 1000 UNIT/ML IJ SOLN
1000.0000 [IU] | Freq: Once | INTRAMUSCULAR | Status: DC
  Filled 2017-01-03: qty 1

## 2017-01-03 MED ORDER — MIRTAZAPINE 15 MG PO TABS
7.5000 mg | ORAL_TABLET | Freq: Every day | ORAL | Status: DC
  Administered 2017-01-03: 7.5 mg via ORAL
  Filled 2017-01-03: qty 1

## 2017-01-03 MED ORDER — DIPHENHYDRAMINE HCL 25 MG PO CAPS: 25.0000 mg | ORAL_CAPSULE | Freq: Four times a day (QID) | ORAL | Status: DC | PRN

## 2017-01-03 MED ORDER — DIPHENHYDRAMINE HCL 25 MG PO CAPS
25.0000 mg | ORAL_CAPSULE | Freq: Four times a day (QID) | ORAL | Status: DC | PRN
Start: 2017-01-03 — End: 2017-01-03
  Filled 2017-01-03: qty 1

## 2017-01-03 MED ORDER — SODIUM CHLORIDE 0.9 % IV SOLN
2.0000 g | Freq: Once | INTRAVENOUS | Status: AC
  Administered 2017-01-03: 2 g via INTRAVENOUS
  Filled 2017-01-03 (×2): qty 20

## 2017-01-03 MED ORDER — GABAPENTIN 300 MG PO CAPS
600.0000 mg | ORAL_CAPSULE | Freq: Two times a day (BID) | ORAL | Status: DC
  Administered 2017-01-03 – 2017-01-07 (×10): 600 mg via ORAL
  Filled 2017-01-03 (×9): qty 2

## 2017-01-03 NOTE — Care Management Note (Addendum)
Case Management Note  Patient Details  Name: Debra [name] MRN: [id number] Date of Birth: 1965/06/28  Subjective/Objective:  Pt transferred from [facility] with MS exacerbation. She is to have plasma exchange. She is from home with her daughter and active with [facility] but cant return to daughters d/t restrictions with the housing.                  Action/Plan: Plan is to return home when medically stable. CM following for d/c needs, physician orders.   Expected Discharge Date:                  Expected Discharge Plan:     In-House Referral:     Discharge planning Services     Post Acute Care Choice:    Choice offered to:     DME Arranged:    DME Agency:     HH Arranged:    HH Agency:     Status of Service:  In process, will continue to follow  If discussed at Long Length of Stay Meetings, dates discussed:    Additional Comments:  Kermit Balo, RN 01/03/2017, 2:21 PM

## 2017-01-03 NOTE — Procedures (Signed)
Lumbar Puncture Procedure Note  Pre-operative Diagnosis:  Multiple Sclerosis  Indications:  Multiple Sclerosis, Concern for NMO   Procedure Details:  Informed consent was obtained after explanation of the risks and benefits of the procedure, refer to the consent documentation.  Time-out performed immediately prior to the procedure.  Patient was placed in the seated position.   The superior aspect of the iliac crests were identified, with the traverse demarcating the L4-L5 interspace.  A bedside ultrasound was used to help identify the spinous processes at L3-L4 level and the intervertebral space was located and marked.  This area was prepped and draped in the usual sterile fashion. Maximum sterile technique was used including antiseptics, cap, gloves, gown, hand hygiene, mask, and sterile sheet.  Local anesthesia with 1% lidocaine was applied subcutaneously then deep to the skin. The spinal needle with trocar was introduced with frequent removal of the trocar to evaluate for cerebrospinal fluid. Due to difficulty with patient positioning related to her weakness, entry into the CSF space was initially difficult and required several adjustments. When this fluid was noted samples were collected in four separate tubes and sent to the lab after proper labeling. The spinal needle with trocar was removed, with minimal bleeding noted upon removal. A sterile bandage was placed over the puncture site after holding pressure.  A spinal needle was inserted at the L3-L4 interspace.   Findings: ~8-10 mL of clear spinal fluid was obtained. Tube 1 was sent for gram stain, culture Tube 2 was sent for protein and glucose Tube 3 was sent for Cell count, diff  Tube 4 was sent for NMO Abs   Condition:   The patient tolerated the procedure well and remains in the same condition as pre-procedure.  Complications: None, tolerated the procedure well   Plan: Pt to remain supine for 1 hour.

## 2017-01-03 NOTE — Procedures (Signed)
Hemodialysis Insertion Procedure Note Debra [name] [id number] 06-11-1965  Procedure: Insertion of Hemodialysis Catheter Type: 3 port  Indications: Hemodialysis / plasmapheresis   Procedure Details Consent: Risks of procedure as well as the alternatives and risks of each were explained to the (patient/caregiver).  Consent for procedure obtained. Time Out: Verified patient identification, verified procedure, site/side was marked, verified correct patient position, special equipment/implants available, medications/allergies/relevent history reviewed, required imaging and test results available.  Performed  Maximum sterile technique was used including antiseptics, cap, gloves, gown, hand hygiene, mask and sheet. Skin prep: Chlorhexidine; local anesthetic administered A antimicrobial bonded/coated triple lumen catheter was placed in the right internal jugular vein using the Seldinger technique. Ultrasound guidance used.Yes.   Catheter placed to 16 cm. Blood aspirated via all 3 ports and then flushed x 3. Line sutured x 2 and dressing applied.  Evaluation Blood flow good Complications: No apparent complications Patient did tolerate procedure well. Chest X-ray ordered to verify placement.  CXR: pending.  Brett Canales Debra [name] ACNP Adolph Pollack PCCM Pager [phone] till 1 pm If no answer page [phone] 01/03/2017, 11:01 AM

## 2017-01-03 NOTE — Progress Notes (Signed)
Inpatient Diabetes Program Recommendations  AACE/ADA: New Consensus Statement on Inpatient Glycemic Control (2015)  Target Ranges:  Prepandial:   less than 140 mg/dL      Peak postprandial:   less than 180 mg/dL (1-2 hours)      Critically ill patients:  140 - 180 mg/dL   Results for PATRESA, LOFTHOUSE (MRN [id number]) as of 01/03/2017 11:33  Ref. Range 01/02/2017 07:37 01/02/2017 12:10 01/02/2017 17:13 01/02/2017 21:10 01/03/2017 06:25  Glucose-Capillary Latest Ref Range: 65 - 99 mg/dL 532 (H) 992 (H) 426 (H) 304 (H) 321 (H)    Review of Glycemic Control  Diabetes history: DM1  Outpatient Diabetes medications: Lantus 15 units BID, Novolog 8 units TID with meals plus Novolog 0-15 units TID Current orders for Inpatient glycemic control: Lantus 17 units BID, Novolog 10 units TID   Inpatient Diabetes Program Recommendations: Insulin - Basal: Please consider increasing Lantus to 22 units BID. Correction (SSI): Please consider ordering Novolog 0-9 units TID with meals and Novolog 0-5 units QHS (in addition to Novolog 10 units TID meal coverage).  Thanks, Orlando Penner, RN, MSN, CDE Diabetes Coordinator Inpatient Diabetes Program [phone] (Team Pager from 8am to 5pm)

## 2017-01-03 NOTE — H&P (Signed)
**This is a late entry, repeat H&P from 01/02/2017 on patient's day of admission. The original note was mistakenly deleted before being cosigned by the appropriate attending.    [facility] H &P   Date: 01/02/2017               Patient Name:  Debra LacrosseCheryl Lynette [name] MRN: [id number]  DOB: [birth year] Age / Sex: 51 y.o., female   PCP: Earl LagosNarendra, Nischal, MD         Medical Service: Internal Medicine Teaching Service         Attending Physician: Dr. Earl LagosNarendra, Nischal, MD    First Contact: Dr. Anthonette LegatoHarden Pager: [phone]  Second Contact: Dr. Johnny BridgeSaraiya Pager: [phone]       After Hours (After 5p/  First Contact Pager: [phone]  weekends / holidays): Second Contact Pager: [phone]   Chief Complaint: Vision changes   History of Present Illness: 51 year old female with past medical history of depression, multiple sclerosis, hypertension, diabetes, who presented as a transfer from outside hospital for consideration of plasmapheresis.   Patient was recently diagnosed with multiple sclerosis in September 2018. She states she has lower extremity weakness but was able to ambulate with a rolling walker until 3 days ago. She is also had worsening bladder/bowel incontinence for the past 2 months. 2 days ago her vision acutely worsened and she states she is no longer able to read. She also notes lower extremity burning pain. She was admitted to [facility] and seen by neurology. Repeat MRI noted new demyelinating lesions. Given concern for prior resistance to steroids and potential neuromyelitis optica the patient was transferred to [facility] for potential plasmapheresis treatment.   She has also had a depressed mood that she attributes to her current medical situation. She has had associated difficulty sleeping, lack of interest in previous hobbies, decreased energy, as well as suicidal ideations stating that she wants to die. She has formulated plans including taking pills and stepping in front of a moving  vehicle but has been limited in doing so by her decreased mobility and medical issues. Denies homicidal ideation. Notes auditory hallucinations which are vague and scratch that rather than the voices. She reports trying several antidepressant medications in the past. No history of suicide attempts. Chart review shows she was evaluated by psychiatry at [facility] prior to transfer.   Meds:  Active Medications      Current Meds  Medication Sig  . [START ON 01/03/2017] amLODipine (NORVASC) 5 MG tablet Take 1 tablet (5 mg total) daily by mouth.  . bethanechol (URECHOLINE) 5 MG tablet Take 1 tablet (5 mg total) 3 (three) times daily by mouth.  . 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 The patient tests 4 times per day.  . ciprofloxacin (CIPRO) 500 MG tablet Take 1 tablet (500 mg total) 2 (two) times daily by mouth.  Debra [name]. [START ON 01/03/2017] DULoxetine (CYMBALTA) 60 MG capsule Take 1 capsule (60 mg total) daily by mouth.  . gabapentin (NEURONTIN) 100 MG capsule Take 1 capsule (100 mg total) 3 (three) times daily by mouth.  . insulin aspart (NOVOLOG) 100 UNIT/ML injection Inject 0-15 Units 3 (three) times daily with meals into the skin.  Debra [name]. insulin aspart (NOVOLOG) 100 UNIT/ML injection Inject 8 Units 3 (three) times daily with meals into the skin.  Debra [name]. insulin glargine (LANTUS) 100 UNIT/ML injection Inject 0.15 mLs (15 Units total) 2 (two) times daily into the skin.  .Debra [name]  risperiDONE (RISPERDAL) 1 MG tablet Take 1 tablet (1 mg total) at bedtime by mouth.  Debra [name] ON 01/03/2017] tamsulosin (FLOMAX) 0.4 MG CAPS capsule Take 1 capsule (0.4 mg total) daily by mouth.   Allergies:       Allergies as of 01/02/2017 - Review Complete 01/02/2017  Allergen Reaction Noted  . Penicillins Anaphylaxis, Nausea And Vomiting, and Rash 07/06/2010  . Pollen extract  11/15/2016  . Tape Rash 02/24/2016       Past Medical History:  Diagnosis Date  .  Adenomatous colonic polyps   . Anemia    2005  . Anxiety    1990  . Arthritis   . Asthma    2000  . Cataract   . Depression 1990  . Difficult intubation    narrow airway  . Gastroesophageal Reflux Disease (GERD)   . Heart murmur    Birth  . Hyperlipidemia    2005  . Hypertension    1998  . Internal hemorrhoids 04/24/06   on colonoscopy  . Migraine   . Neuropathy of the hands & feet   . Restless Leg Syndrome   . Right Ankle Fracture 10/06/2013  . Sleep paralysis   . Stable angina pectoris    2007: cath showing normal cors.   . Stroke 1990  . Type I Diabetes Mellitus 1988   Family History:       Family History  Problem Relation Age of Onset  . Hypertension Mother   . Kidney disease Mother   . Hypertension Father   . Breast cancer Maternal Grandmother   . Prostate cancer Maternal Grandfather   . Ovarian cancer Paternal Grandmother   . Prostate cancer Paternal Grandfather   . Colon cancer Maternal Uncle    Family history of malignant neoplasm of gastrointestinal tract   Social History:  Social History        Tobacco Use  . Smoking status: Never Smoker  . Smokeless tobacco: Never Used  Substance Use Topics  . Alcohol use: No    Alcohol/week: 0.0 oz  . Drug use: No    Comment: drug addict   Review of Systems:  A complete ROS was negative except as per HPI.   Physical Exam:  Blood pressure (!) 189/84, pulse 78, temperature 99.2 F (37.3 C), temperature source Oral, resp. rate 16, SpO2 99 %.  Physical Exam  Constitutional: She is oriented to person, place, and time.  Sitting in bed eating lunch, no acute distress  HENT:  Mouth/Throat: Oropharynx is clear and moist.  Eyes: EOM are normal. Pupils are equal, round, and reactive to light.  Cardiovascular: Normal rate and regular rhythm.  Systolic murmur  Pulmonary/Chest: Effort normal and breath sounds normal. No respiratory distress.  Abdominal: Soft. Bowel sounds are normal. She exhibits no distension.  There is no tenderness.  Neurological: She is alert and oriented to person, place, and time.  3/5 strength in bilateral LEs, 5/5 in uper extremities, decreased visual acuity with visual fields intact, CN otherwise normal, diminished LE reflexes, decreased rectal tone, normal coordination on ftn  Skin: Skin is warm and dry.  Psychiatric:  Depressed mood    Assessment & Plan by Problem:   Multiple Sclerosis,  Concern for NMO  Patient with a history of multiple sclerosis diagnosed on recent admission in September 2018 presenting with worsening existing deficits of lower extremity weakness and bladder/bowel incontinence as well as new vision deficits. Evidence of new demyelination on MRI. Due to reported history of resistance  to prior steroid treatment and concern for neuromyelitis optica, the patient was transferred for consideration of plasmapheresis treatment. On initial discussion of case with neurology will provide additional steroid dose tonight and consider plasmapheresis initiation tomorrow.  --Neuro consulted, appreciate recommendations  --Solumedrol 1 g, potential plasmapheresis tomorrow  --Hold heparin ppx for potential LP, central access  --Neuro checks   Major Depressive Disorder  Patient with a history of severe depression with psychotic features requiring inpatient psychiatric admission currently expressing suicidal ideation. She was evaluated by psychiatry prior to transfer who recommended Cymbalta 60 mg and risperidone 1 mg.  --Continue Cymbalta 60 mg, Risperdal 1 mg  --Suicide precautions, sitter  --Psych eval here   UTI  Chart review notes patient undergoing treatment for UTI with Cipro. No UA or urine culture available on chart review, unclear start date as well. Patient is also on bethanechol and tamsulosin. Will hold off on resuming Cipro until clarification or other data available  --Rpt U/A   Acute Kidney Injury  Patient also has elevated creatinine to 2.29 compared  to prior values around 1.5. Unclear etiology the patient appears to have been on lisinopril and meloxicam prior to transfer.  --Hold lisinopril, mobic  --Avoid other nephrotoxic agents  --NS 100 ml/hr and assess response   H/o Diabetes  Patient has history of diabetes with peripheral neuropathy.  --Lantus 17 units twice daily  --NovoLog 10 units 3 times daily with meals  --Continue gabapentin   Hypertension  Patient with a history of hypertension and elevated BP on arrival from hospital transfer.  --Continue hydralazine, amlodipine, metoprolol   Dispo: Admit patient to Inpatient with expected length of stay greater than 2 midnights.   Signed:  [name], MD  01/02/2017, 6:05 PM  Pager: [phone]

## 2017-01-03 NOTE — Progress Notes (Signed)
   Subjective: No acute events overnight, patient reports no significant change in her lower extremity weakness, incontinence, or vision changes. She had a central line placed for plasmapheresis access and tolerated the procedure well.  Objective:  Vital signs in last 24 hours: Vitals:   01/02/17 1453 01/02/17 1800 01/02/17 1900 01/03/17 0620  BP: (!) 189/84 (!) 188/71 (!) 180/78 (!) 153/77  Pulse: 78 80 78 82  Resp: 16 17  16   Temp: 99.2 F (37.3 C) 98.9 F (37.2 C) 98.1 F (36.7 C) 99.3 F (37.4 C)  TempSrc: Oral Oral Oral Oral  SpO2: 99% 98% 97% 97%  Weight:    160 lb 0.9 oz (72.6 kg)  Height:    5\' 4"  (1.626 m)   General: Resting in bed comfortably, no acute distress CV: Systolic murmur, normal rate and rhythm Resp: Lungs clear, normal work of breathing, no distress  Abd: Soft, +BS, non-tender Extr: No swelling Neuro: Alert and oriented x3, 3/5 LE strength bilaterally, stable  Skin: Warm, dry     Assessment/Plan:  Multiple Sclerosis, potential NMO  Patient has history of multiple sclerosis with lower extremity weakness and bladder/bowel incontinence which have worsened, as well as new vision changes.  MRI shows new demyelinating lesions.  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  She has received 2 doses of steroids, though there is concern for a history of lack of improvement with steroids plasmapheresis may be needed.  Neurology consulted.  Patient to have LP and plasmapheresis performed today. --Plasmapheresis --LP with cell count, protein, glucose, gram stain, NMO Abs and oligoclonal bands --Neuro checks   Major Depressive Disorder Patient has a history of severe depression with psychotic features which is required inpatient psychiatric admission.  She is currently expressing suicidal ideation.  She was previously on Cymbalta 60 mg and Risperdal 1 mg based on psychiatric evaluation at outside hospital.  Psych consulted here, appreciate updated  recommendations. --Continue Cymbalta 60 mg --DC risperidone, Start Remeron 7.5 mg qhs --Inpatient psych admission following medical stabilization   Acute Kidney Injury Patient presented with elevated creatinine to 2.29 compared to prior values around 1.5.  She appears to have been on lisinopril and meloxicam prior to transfer.  Given IV hydration with improvement in creatinine, currently 1.83. --Hold lisinopril, mobic, avoid nephrotoxic agents --Renally adjust gabapentin dose --BMP   H/o Diabetes Patient has a history of diabetes with peripheral neuropathy.  Has had increased blood glucose levels with steroid dosing.  Given transition of MS treatment from steroids to plasmapheresis will monitor glucose values without changes for now.  UTI Chart review notes patient undergoing treatment for UTI with Cipro on admission to outside hospital.  No UA or urine culture available on chart review and start date of antibiotics is unclear.  Holding off on treatment until more information available.  --Rpt U/A     Dispo: Anticipated discharge in approximately 3-4 day(s).   [name], MD 01/03/2017, 9:10 AM Pager: [phone]

## 2017-01-04 ENCOUNTER — Encounter (HOSPITAL_COMMUNITY): Payer: Self-pay | Admitting: *Deleted

## 2017-01-04 DIAGNOSIS — R330 Drug induced retention of urine: Secondary | ICD-10-CM

## 2017-01-04 DIAGNOSIS — R197 Diarrhea, unspecified: Secondary | ICD-10-CM

## 2017-01-04 DIAGNOSIS — N390 Urinary tract infection, site not specified: Secondary | ICD-10-CM

## 2017-01-04 DIAGNOSIS — N179 Acute kidney failure, unspecified: Secondary | ICD-10-CM

## 2017-01-04 DIAGNOSIS — E1142 Type 2 diabetes mellitus with diabetic polyneuropathy: Secondary | ICD-10-CM

## 2017-01-04 DIAGNOSIS — F333 Major depressive disorder, recurrent, severe with psychotic symptoms: Secondary | ICD-10-CM

## 2017-01-04 DIAGNOSIS — Z794 Long term (current) use of insulin: Secondary | ICD-10-CM

## 2017-01-04 LAB — URINALYSIS, ROUTINE W REFLEX MICROSCOPIC
Bilirubin Urine: NEGATIVE
Glucose, UA: 150 mg/dL — AB
Hgb urine dipstick: NEGATIVE
KETONES UR: NEGATIVE mg/dL
LEUKOCYTES UA: NEGATIVE
NITRITE: NEGATIVE
PROTEIN: NEGATIVE mg/dL
Specific Gravity, Urine: 1.012 (ref 1.005–1.030)
pH: 6 (ref 5.0–8.0)

## 2017-01-04 LAB — CBC
HCT: 35.7 % — ABNORMAL LOW (ref 36.0–46.0)
Hemoglobin: 11.3 g/dL — ABNORMAL LOW (ref 12.0–15.0)
MCH: 29.3 pg (ref 26.0–34.0)
MCHC: 31.7 g/dL (ref 30.0–36.0)
MCV: 92.5 fL (ref 78.0–100.0)
Platelets: 226 10*3/uL (ref 150–400)
RBC: 3.86 MIL/uL — ABNORMAL LOW (ref 3.87–5.11)
RDW: 14.8 % (ref 11.5–15.5)
WBC: 8.1 10*3/uL (ref 4.0–10.5)

## 2017-01-04 LAB — BASIC METABOLIC PANEL
Anion gap: 9 (ref 5–15)
BUN: 32 mg/dL — AB (ref 6–20)
CALCIUM: 8.9 mg/dL (ref 8.9–10.3)
CO2: 23 mmol/L (ref 22–32)
Chloride: 107 mmol/L (ref 101–111)
Creatinine, Ser: 1.72 mg/dL — ABNORMAL HIGH (ref 0.44–1.00)
GFR calc Af Amer: 39 mL/min — ABNORMAL LOW (ref >60)
GFR, EST NON AFRICAN AMERICAN: 34 mL/min — AB (ref >60)
GLUCOSE: 341 mg/dL — AB (ref 65–99)
Potassium: 4 mmol/L (ref 3.5–5.1)
Sodium: 139 mmol/L (ref 135–145)

## 2017-01-04 LAB — GLUCOSE, CAPILLARY
GLUCOSE-CAPILLARY: 275 mg/dL — AB (ref 65–99)
GLUCOSE-CAPILLARY: 183 mg/dL — AB (ref 65–99)
Glucose-Capillary: 316 mg/dL — ABNORMAL HIGH (ref 65–99)
Glucose-Capillary: 239 mg/dL — ABNORMAL HIGH (ref 65–99)

## 2017-01-04 LAB — C DIFFICILE QUICK SCREEN W PCR REFLEX
C DIFFICILE (CDIFF) TOXIN: NEGATIVE
C DIFFICLE (CDIFF) ANTIGEN: NEGATIVE
C Diff interpretation: NOT DETECTED

## 2017-01-04 MED ORDER — INSULIN GLARGINE 100 UNIT/ML ~~LOC~~ SOLN
20.0000 [IU] | Freq: Two times a day (BID) | SUBCUTANEOUS | Status: DC
  Administered 2017-01-04 – 2017-01-09 (×10): 20 [IU] via SUBCUTANEOUS
  Filled 2017-01-04 (×10): qty 0.2

## 2017-01-04 MED ORDER — FLUCONAZOLE 100 MG PO TABS
150.0000 mg | ORAL_TABLET | Freq: Once | ORAL | Status: AC
  Administered 2017-01-05: 150 mg via ORAL
  Filled 2017-01-04: qty 2

## 2017-01-04 MED ORDER — INSULIN ASPART 100 UNIT/ML ~~LOC~~ SOLN
0.0000 [IU] | Freq: Every day | SUBCUTANEOUS | Status: DC
  Administered 2017-01-13: 2 [IU] via SUBCUTANEOUS
  Administered 2017-01-21 – 2017-01-22 (×2): 5 [IU] via SUBCUTANEOUS
  Administered 2017-01-24 – 2017-01-25 (×2): 4 [IU] via SUBCUTANEOUS
  Administered 2017-01-26: 3 [IU] via SUBCUTANEOUS
  Administered 2017-01-27 – 2017-01-29 (×2): 2 [IU] via SUBCUTANEOUS

## 2017-01-04 MED ORDER — MIRTAZAPINE 15 MG PO TABS
15.0000 mg | ORAL_TABLET | Freq: Every day | ORAL | Status: DC
  Administered 2017-01-04 – 2017-01-10 (×7): 15 mg via ORAL
  Filled 2017-01-04 (×7): qty 1

## 2017-01-04 NOTE — Progress Notes (Signed)
Reason for consult:   Subjective: patient is doing about the same, feels vision is slightly better.    ROS: negative except patient complaining of diarhea  Examination  Vital signs in last 24 hours: Temp:  [96.9 F (36.1 C)-98.5 F (36.9 C)] 97.4 F (36.3 C) (11/08 0900) Pulse Rate:  [84-93] 93 (11/08 0900) Resp:  [11-20] 20 (11/08 0900) BP: (86-147)/(45-92) 144/62 (11/08 1241) SpO2:  [95 %-98 %] 98 % (11/08 0900) Weight:  [72.6 kg (160 lb 0.9 oz)] 72.6 kg (160 lb 0.9 oz) (11/07 1500)  General: Not in distress, cooperative CVS: pulse-normal rate and rhythm RS: breathing comfortably Extremities: normal    Neurological Examination Mental Status: Alert, oriented, thought content appropriate.  Speech fluent without evidence of aphasia.  Able to follow 3 step commands without difficulty. Cranial Nerves: II: Decreased vision with in both eyes. Patient is able to count fingers and recognize colors III,IV, VI: ptosis not present, extra-ocular motions intact bilaterally pupils equal, round, minimally reactive to light and accommodation V,VII: smile symmetric, facial light touch sensation normal bilaterally VIII: hearing normal bilaterally IX,X: uvula rises symmetrically XI: bilateral shoulder shrug XII: midline tongue extension Motor: Right :  Upper extremity   4+/5                                      Left:     Upper extremity   4+/5             Lower extremity   3/5                                                  Lower extremity   3/5 Tone and bulk:normal tone throughout; no atrophy noted Sensory: Pinprick and light touch intact throughout, bilaterally Deep Tendon Reflexes: diminished lower extremity reflexes  Plantars: Right: downgoing                                Left: downgoing Cerebellar: normal finger-to-nose,  Gait: Not tested    Basic Metabolic Panel: Recent Labs  Lab 01/02/17 0410 01/03/17 0358 01/03/17 1443 01/04/17 0807  NA 133* 134* 139 139  K 4.4 4.4  3.7 4.0  CL 96* 99* 97* 107  CO2 28 26  --  23  GLUCOSE 345* 350* 212* 341*  BUN 44* 39* 35* 32*  CREATININE 2.29* 1.83* 1.70* 1.72*  CALCIUM 8.6* 8.8*  --  8.9    CBC: Recent Labs  Lab 01/02/17 0410 01/03/17 0358 01/03/17 1443 01/04/17 0807  WBC 10.0 8.9  --  8.1  HGB 11.4* 10.8* 10.9* 11.3*  HCT 34.7* 33.6* 32.0* 35.7*  MCV 90.5 90.6  --  92.5  PLT 231 230  --  226     Coagulation Studies: No results for input(s): LABPROT, INR in the last 72 hours.  Imaging Reviewed:     ASSESSMENT AND PLAN  This is a 51 year old female with recent diagnosis of MS who presents with a relapse. MRI shows multiple new enhancing lesions. The patient has recently received coarse of IV steroids with no response. She was transferred from [facility] to [facility] to receive plasmapheresis. Studies have shown plasmapheresis has been beneficial in treatment of refractory MS. Also  there is a concern the patient may have NMO which also responds to plasmapheresis. She tolerated first day of plasmapheresis. LP was performed yesterday and protein elevated at 94.   PLAN Continue Plasmapheresis, scheduled on 11/9, 11/10, 11/12, 1/14 F/U CSF for oligoclonal bands, NMO ab       Sushanth Aroor [facility] Pager Number [phone] For questions after 7pm please refer to AMION to reach the Neurologist on call

## 2017-01-04 NOTE — Progress Notes (Addendum)
Subjective: No acute events overnight, she reports visual and lower extremity deficits are stable.  She tolerated plasmapheresis yesterday without issue.  The patient in the nursing note increased diarrhea since yesterday afternoon, as well as lower abdominal discomfort. Nursing also notes concern for urinary retention.   Objective:  Vital signs in last 24 hours: Vitals:   01/03/17 1715 01/03/17 2124 01/04/17 0227 01/04/17 0500  BP: (!) 114/92 105/72 90/63 (!) 147/86  Pulse:  86 86 88  Resp: 18 18 16 18   Temp: 97.6 F (36.4 C) 98.1 F (36.7 C)  98.5 F (36.9 C)  TempSrc: Oral Oral  Oral  SpO2: 96% 97% 96% 98%  Weight:      Height:       General: Resting in bed comfortably, no acute distress CV: Systolic murmur, normal rate and rhythm Resp: Lungs clear, normal work of breathing, no distress  Abd: Soft, +BS, slight TTP to lower abdomen Extr: No swelling Neuro: Alert and oriented x3, 2/5 LE strength bilaterally, 5/5 UEs Skin: Warm, dry     Assessment/Plan:  Multiple Sclerosis, potential NMO  Patient has history of multiple sclerosis with lower extremity weakness and bladder/bowel incontinence which have worsened, as well as new vision changes.  MRI shows new demyelinating lesions.  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  She received IV steroids x2 doses and plasmapheresis has been initiated, sessions scheduled for 11/9, 11/10, 11/12, 11/14. LP performed showed increased protein, oligoclonal bands and NMO Abs pending. Neurology consulted and following.  --Plasmapheresis --F/u NMO Abs and oligoclonal bands  --Neuro checks   Major Depressive Disorder Patient has a history of severe depression with psychotic features which is required inpatient psychiatric admission.  She is currently expressing suicidal ideation.  She was previously on Cymbalta 60 mg and Risperdal 1 mg based on psychiatric evaluation at outside hospital.  Psych consulted here, appreciate updated  recommendations. --Continue Cymbalta 60 mg --Continue Remeron 7.5 mg qhs --Inpatient psych admission following medical stabilization   Acute Kidney Injury Patient presented with elevated creatinine to 2.29 compared to prior values around 1.5.  Given IV hydration with improvement in creatinine, approaching baseline, currently 1.72.  --Avoid nephrotoxic agents --Renally adjust gabapentin dose --BMP   H/o Diabetes Patient has a history of diabetes with peripheral neuropathy.  Has had increased blood glucose levels with recent steroid dosing.  Will alter insulin regimen as below and adjust as necessary if she continues to have elevated blood glucose levels now that steroids have been discontinued. --Increase Lantus to 20 U BID --Cont Novolog 10 U with meals, add night time correctional  --CBG achs  Diarrhea Complaints of diarrhea starting yesterday afternoon.  Though she is afebrile with normal white count, gIven recent hospitalization and abx for potential UTI C. Diff was sent which resulted as negative. May be related to recent initiation of remeron or cymbalta. Will continue to monitor   Urinary Retention Nursing notes concern for decrease in urination. 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  --Bladder scans, in/out if >400 mL and will place foley if continue to have retention   UTI Chart review notes patient undergoing treatment for UTI with Cipro on admission to outside hospital.  No UA or urine culture available on chart review and start date of antibiotics is unclear.  Holding off on treatment until more information available.  --Rpt U/A     Dispo: Anticipated discharge in approximately 6-7 day(s).   [name],  MD 01/04/2017, 7:06 AM Pager: [phone]

## 2017-01-04 NOTE — Evaluation (Signed)
Physical Therapy Evaluation Patient Details Name: Debra [name] MRN: [id number] DOB: [birth year] Today's Date: 01/04/2017   History of Present Illness  Pt is a 51 y/o female [facility] secondary to MS exacerbation and major depressive disorder. [facility] catheter and for plasmophoresis treatments. Pt also very depressed about recent decline in function and psychiatry following. PMH includes MS, HTN, DM with neuropathy, depression, heart murmur, and restless leg syndrome.   Clinical Impression  Pt admitted secondary to problem above with deficits below. PTA, pt was living alone and was using RW for ambulation. Upon eval, pt very weak in BLE (RLE<LLE), and pt exhibited bilat knee buckling in standing. Required mod A for bed mobility for LE assist and max A to stand at EOB. Pt very weak and reporting some dizziness, so further mobility deferred this session. Pt wanting to work with PT to get stronger and to get back to walking. [facility]. Recommending CIR at d/c to increase independence and safety with mobility at home. Will continue to follow acutely to maximize functional mobility independence and safety.     Follow Up Recommendations CIR;Supervision/Assistance - 24 hour    Equipment Recommendations  None recommended by PT    Recommendations for Other Services Rehab consult     Precautions / Restrictions Precautions Precautions: Fall;Other (comment) Precaution Comments: suicide precautions  Restrictions Weight Bearing Restrictions: No      Mobility  Bed Mobility Overal bed mobility: Needs Assistance Bed Mobility: Supine to Sit;Sit to Supine     Supine to sit: Mod assist Sit to supine: Mod assist   General bed mobility comments: Mod A  for LE management and trunk elevation for sitting. Required mod A for LE lift assist upon return to sitting. Pt experiencing some dizziness upon  sitting, which got a little better with increased time.   Transfers Overall transfer level: Needs assistance Equipment used: Rolling walker (2 wheeled) Transfers: Sit to/from Stand Sit to Stand: Max assist         General transfer comment: Max A for lift assist and steadying upon standing. Verbal cues for safe hand placement. Pt VERY weak (RLE>LLE) and was unable to maintain standing for prolonged period secondary to knee buckling. Required assist with controlled descent to sitting. Pt also dizzy upon standing and required seated rest.   Ambulation/Gait             General Gait Details: unsafe to attempt this session   Stairs            Wheelchair Mobility    Modified Rankin (Stroke Patients Only)       Balance Overall balance assessment: Needs assistance Sitting-balance support: No upper extremity supported;Feet supported Sitting balance-Leahy Scale: Fair     Standing balance support: Bilateral upper extremity supported;During functional activity Standing balance-Leahy Scale: Poor Standing balance comment: Reliant on BUE and external support to maintain standing balance.                              Pertinent Vitals/Pain Pain Assessment: 0-10 Pain Score: 7  Pain Location: IJ line and R hip  Pain Descriptors / Indicators: Aching;Operative site guarding Pain Intervention(s): Limited activity within patient's tolerance;Monitored during session;Repositioned    Home Living Family/patient expects to be discharged to:: Private residence Living Arrangements: Alone Available Help at Discharge: Other (Comment)(reports no one available to check  on her  ) Type of Home: House Home Access: Stairs to enter Entrance Stairs-Rails: Right Entrance Stairs-Number of Steps: 5 Home Layout: One level Home Equipment: Walker - 2 wheels;Wheelchair - manual;Cane - single point;Tub bench      Prior Function Level of Independence: Independent with assistive device(s)          Comments: USed RW for ambulation.      Hand Dominance   Dominant Hand: Right    Extremity/Trunk Assessment   Upper Extremity Assessment Upper Extremity Assessment: Defer to OT evaluation    Lower Extremity Assessment Lower Extremity Assessment: RLE deficits/detail;LLE deficits/detail RLE Deficits / Details: Numbness from hip down. Grossly 2+/5 LLE Deficits / Details: Numbness from hip down. Grossly 3/5 throughout.     Cervical / Trunk Assessment Cervical / Trunk Assessment: Kyphotic  Communication   Communication: No difficulties  Cognition Arousal/Alertness: Awake/alert Behavior During Therapy: WFL for tasks assessed/performed Overall Cognitive Status: Within Functional Limits for tasks assessed                                        General Comments      Exercises General Exercises - Lower Extremity Ankle Circles/Pumps: AROM;Both;10 reps Long Arc Quad: AAROM;Left;10 reps;Right;5 reps   Assessment/Plan    PT Assessment Patient needs continued PT services  PT Problem List Decreased strength;Decreased activity tolerance;Decreased balance;Decreased mobility;Decreased knowledge of use of DME;Decreased knowledge of precautions;Impaired sensation;Pain       PT Treatment Interventions DME instruction;Gait training;Stair training;Functional mobility training;Therapeutic activities;Therapeutic exercise;Neuromuscular re-education;Balance training;Patient/family education    PT Goals (Current goals can be found in the Care Plan section)  Acute Rehab PT Goals Patient Stated Goal: to get up and move  PT Goal Formulation: With patient Time For Goal Achievement: 01/18/17 Potential to Achieve Goals: Good    Frequency Min 3X/week   Barriers to discharge Decreased caregiver support Lives alone     Co-evaluation               AM-PAC PT "6 Clicks" Daily Activity  Outcome Measure Difficulty turning over in bed (including adjusting  bedclothes, sheets and blankets)?: A Lot Difficulty moving from lying on back to sitting on the side of the bed? : Unable Difficulty sitting down on and standing up from a chair with arms (e.g., wheelchair, bedside commode, etc,.)?: Unable Help needed moving to and from a bed to chair (including a wheelchair)?: Total Help needed walking in hospital room?: Total Help needed climbing 3-5 steps with a railing? : Total 6 Click Score: 7    End of Session Equipment Utilized During Treatment: Gait belt Activity Tolerance: Treatment limited secondary to medical complications (Comment)(dizziness ) Patient left: in bed;with call bell/phone within reach;with nursing/sitter in room(suicide sitter in room ) Nurse Communication: Mobility status PT Visit Diagnosis: Unsteadiness on feet (R26.81);Muscle weakness (generalized) (M62.81);Other symptoms and signs involving the nervous system (R29.898);Difficulty in walking, not elsewhere classified (R26.2)    Time: 9702-6378 PT Time Calculation (min) (ACUTE ONLY): 23 min   Charges:   PT Evaluation $PT Eval Moderate Complexity: 1 Mod PT Treatments $Therapeutic Activity: 8-22 mins   PT G Codes:        Gladys Damme, PT, DPT  Acute Rehabilitation Services  Pager: [phone]   Debra [name] 01/04/2017, 6:37 PM

## 2017-01-04 NOTE — Progress Notes (Signed)
Nurse in and out cath pt, 700 cc was documented, Pt had white milky discharge and sediments in the urine. In and out cath had white milky discharge in the cath. MD notified. indwelling foley cath ordered for urinary retention.

## 2017-01-04 NOTE — Progress Notes (Signed)
Inpatient Diabetes Program Recommendations  AACE/ADA: New Consensus Statement on Inpatient Glycemic Control (2015)  Target Ranges:  Prepandial:              less than 140 mg/dL                            Peak postprandial:    less than 180 mg/dL (1-2 hours)                            Critically ill patients:  140 - 180 mg/dL  Results for Debra [name], Debra [name] (MRN [id number]) as of 01/04/2017 10:34  Ref. Range 01/03/2017 06:25 01/03/2017 11:44 01/03/2017 21:25 01/04/2017 06:17  Glucose-Capillary Latest Ref Range: 65 - 99 mg/dL 219 (H)  Novolog 10 units  Lantus 17 units @ 11:06 269 (H)  Novolog 10 units 284 (H)  Lantus 17 units @ 23:33 275 (H)   Results for Debra [name], Debra [name] (MRN [id number]) as of 01/04/2017 10:34  Ref. Range 01/04/2017 08:07  Glucose Latest Ref Range: 65 - 99 mg/dL 826 (H)   Review of Glycemic Control  Diabetes history: DM1  Outpatient Diabetes medications: Lantus 15 units BID, Novolog 8 units TID with meals plus Novolog 0-15 units TID Current orders for Inpatient glycemic control: Lantus 17 units BID, Novolog 10 units TID with meals   Inpatient Diabetes Program Recommendations: Insulin - Basal: Please consider increasing Lantus to 22 units BID. Correction (SSI): Please consider ordering Novolog 0-9 units TID with meals and Novolog 0-5 units QHS (in addition to Novolog 10 units TID meal coverage).  NOTE: Over the past 24 hours, glucose has ranged from 269-341 mg/dl. Recommend increasing Lantus and adding Novolog correction scale to improve glycemic control.  Thanks, Orlando Penner, RN, MSN, CDE Diabetes Coordinator Inpatient Diabetes Program [phone] (Team Pager from 8am to 5pm)

## 2017-01-05 DIAGNOSIS — Z87440 Personal history of urinary (tract) infections: Secondary | ICD-10-CM

## 2017-01-05 DIAGNOSIS — N898 Other specified noninflammatory disorders of vagina: Secondary | ICD-10-CM

## 2017-01-05 LAB — BASIC METABOLIC PANEL
ANION GAP: 6 (ref 5–15)
BUN: 33 mg/dL — AB (ref 6–20)
CALCIUM: 8.9 mg/dL (ref 8.9–10.3)
CO2: 24 mmol/L (ref 22–32)
CREATININE: 1.8 mg/dL — AB (ref 0.44–1.00)
Chloride: 108 mmol/L (ref 101–111)
GFR calc Af Amer: 37 mL/min — ABNORMAL LOW (ref >60)
GFR, EST NON AFRICAN AMERICAN: 32 mL/min — AB (ref >60)
GLUCOSE: 153 mg/dL — AB (ref 65–99)
Potassium: 4 mmol/L (ref 3.5–5.1)
Sodium: 138 mmol/L (ref 135–145)

## 2017-01-05 LAB — NEUROMYELITIS OPTICA AUTOAB, IGG: NMO-IgG: <1.5 U/mL (ref 0.0–3.0)

## 2017-01-05 LAB — GLUCOSE, CAPILLARY
GLUCOSE-CAPILLARY: 123 mg/dL — AB (ref 65–99)
Glucose-Capillary: 73 mg/dL (ref 65–99)
Glucose-Capillary: 150 mg/dL — ABNORMAL HIGH (ref 65–99)

## 2017-01-05 LAB — POCT I-STAT, CHEM 8
BUN: 30 mg/dL — ABNORMAL HIGH (ref 6–20)
CALCIUM ION: 1.25 mmol/L (ref 1.15–1.40)
CREATININE: 1.7 mg/dL — AB (ref 0.44–1.00)
Chloride: 103 mmol/L (ref 101–111)
GLUCOSE: 179 mg/dL — AB (ref 65–99)
HCT: 29 % — ABNORMAL LOW (ref 36.0–46.0)
HEMOGLOBIN: 9.9 g/dL — AB (ref 12.0–15.0)
POTASSIUM: 3.9 mmol/L (ref 3.5–5.1)
Sodium: 140 mmol/L (ref 135–145)
TCO2: 24 mmol/L (ref 22–32)

## 2017-01-05 MED ORDER — CALCIUM CARBONATE ANTACID 500 MG PO CHEW
2.0000 | CHEWABLE_TABLET | ORAL | Status: AC
  Administered 2017-01-05 (×2): 400 mg via ORAL
  Filled 2017-01-05 (×2): qty 2

## 2017-01-05 MED ORDER — DIPHENHYDRAMINE HCL 25 MG PO CAPS: 25.0000 mg | ORAL_CAPSULE | Freq: Four times a day (QID) | ORAL | Status: DC | PRN

## 2017-01-05 MED ORDER — ACD FORMULA A 0.73-2.45-2.2 GM/100ML VI SOLN
500.0000 mL | Status: DC
  Administered 2017-01-05: 500 mL via INTRAVENOUS

## 2017-01-05 MED ORDER — ALBUMIN HUMAN 25 % IV SOLN
INTRAVENOUS | Status: AC
  Administered 2017-01-05 (×3): via INTRAVENOUS_CENTRAL
  Filled 2017-01-05 (×3): qty 200

## 2017-01-05 MED ORDER — HEPARIN SODIUM (PORCINE) 1000 UNIT/ML IJ SOLN: 1000.0000 [IU] | Freq: Once | INTRAMUSCULAR | Status: DC

## 2017-01-05 MED ORDER — SODIUM CHLORIDE 0.9 % IV SOLN
2.0000 g | Freq: Once | INTRAVENOUS | Status: AC
  Administered 2017-01-05: 2 g via INTRAVENOUS
  Filled 2017-01-05: qty 20

## 2017-01-05 MED ORDER — CALCIUM CARBONATE ANTACID 500 MG PO CHEW
CHEWABLE_TABLET | ORAL | Status: AC
  Administered 2017-01-05: 400 mg via ORAL
  Filled 2017-01-05: qty 4

## 2017-01-05 MED ORDER — ACD FORMULA A 0.73-2.45-2.2 GM/100ML VI SOLN
Status: AC
  Administered 2017-01-05: 500 mL via INTRAVENOUS
  Filled 2017-01-05: qty 500

## 2017-01-05 MED ORDER — ACETAMINOPHEN 325 MG PO TABS: 650.0000 mg | ORAL_TABLET | ORAL | Status: DC | PRN

## 2017-01-05 MED ORDER — ACD FORMULA A 0.73-2.45-2.2 GM/100ML VI SOLN
Status: AC
  Filled 2017-01-05: qty 500

## 2017-01-05 NOTE — Progress Notes (Signed)
OT Cancellation Note  Patient Details Name: Debra [name] MRN: [id number] DOB: [birth year]   Cancelled Treatment:    Reason Eval/Treat Not Completed: Patient at procedure or test/ unavailable. Pt currently in HD. Will check back if time allows.     Margaretmary Eddy [facility] 01/05/2017, 10:26 AM

## 2017-01-05 NOTE — Progress Notes (Signed)
Subjective: No acute events overnight, she states her vision is stable, lower extremity weakness has improved slightly. She states her diarrhea has resolved, no abdominal pain. Yesterday, nursing noted vaginal discharge during catheterization for urinary retention. The pt had not noticed discharge or vaginal irritation previously, though states her sensation to the area is likely decreased.   Objective:  Vital signs in last 24 hours: Vitals:   01/05/17 1014 01/05/17 1033 01/05/17 1045 01/05/17 1107  BP: (!) 125/58 (!) 127/55 121/60 (!) 112/54  Pulse: 82 83 83 83  Resp: 12 12 13 10   Temp: 98.6 F (37 C) 98.6 F (37 C) 98.7 F (37.1 C) 98.7 F (37.1 C)  TempSrc:  Oral Oral Oral  SpO2:    98%  Weight:      Height:       General: Resting in bed comfortably, undergoing plasma exchange no acute distress CV: Systolic murmur, normal rate and rhythm Resp: Lungs clear, normal work of breathing, no distress  Abd: Soft, +BS, non-tender Extr: No swelling Neuro: Alert and oriented x3, Interval improvement in +3/5 LE strength bilaterally, 5/5 UEs Skin: Warm, dry     Assessment/Plan:  Multiple Sclerosis, potential NMO  Patient has history of multiple sclerosis with lower extremity weakness and bladder/bowel incontinence which have worsened, as well as new vision changes.  MRI shows new demyelinating lesions.  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  She received IV steroids x2 doses and plasmapheresis has been initiated, sessions scheduled for 11/9, 11/10, 11/12, 11/14. LP performed showed increased protein, oligoclonal bands and NMO Abs pending. Neurology consulted and following.  --Plasmapheresis per Neurology --F/u NMO Abs and oligoclonal bands  --Neuro checks   Major Depressive Disorder Patient has a history of severe depression with psychotic features which is required inpatient psychiatric admission.  She was expressing suicidal ideation on admission, no significant  change in mood.  She was previously on Cymbalta 60 mg and Risperdal 1 mg based on psychiatric evaluation at outside hospital.  Psych consulted here, appreciate updated recommendations. --Continue Cymbalta 60 mg --Remeron 15 mg qhs (increased per pharm recommendation)  --Inpatient psych admission following medical stabilization   Acute Kidney Injury Patient presented with elevated creatinine to 2.29 compared to prior values around 1.5.  Given IV hydration with improvement in creatinine, approaching baseline, currently 1.70.  --Avoid nephrotoxic agents --BMP   Vaginal Discharge During in/out cath and subsequent placement of foley, nursing noted thick, white vaginal discharge. Given recent exposure to steroids, as well as hyperglycemia, will treat empirically for candidal vulvovaginitis.  --Fluconazole 150 mg x1   H/o Diabetes Patient has a history of diabetes with peripheral neuropathy.  Had increased blood glucose levels with recent steroid dosing and inpatient insulin regimen adjusted, recent bgs <180 --Cont Lantus to 20 U BID --Cont Novolog 10 U with meals, + night time correctional  --CBG achs  Diarrhea, resolved Complaints of diarrhea starting afternoon of 11/7 and continued through yesterday morning, C diff negative. May be related to recent initiation of remeron or cymbalta. Diarrhea has resolved.   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 Foley placed and will perform a voiding trial as her other neurologic deficits improve.   H/o UTI Chart review notes patient undergoing treatment for UTI with Cipro on admission to outside hospital.  No UA or urine culture available on chart review and start date of antibiotics is unclear. Rpt U/A performed here negative for infection  Dispo: Anticipated discharge in approximately 5-6 day(s).   [name], MD 01/05/2017, 11:56 AM Pager: [phone]

## 2017-01-05 NOTE — Progress Notes (Signed)
Physical Therapy Treatment Patient Details Name: Debra [name] MRN: [id number] DOB: [birth year] Today's Date: 01/05/2017    History of Present Illness Pt is a 51 y/o female [facility] secondary to MS exacerbation and major depressive disorder. [facility] catheter and for plasmophoresis treatments. Pt also very depressed about recent decline in function and psychiatry following. PMH includes MS, HTN, DM with neuropathy, depression, heart murmur, and restless leg syndrome.     PT Comments    Pt performed stand pivot transfer with Max A+[facility] for toileting. After which pt stood second time with +2 assist, and third person switched out BSC with recliner. Pt LE very weak limiting her ability to stand for >5 seconds before knees begin buckling. Pt would benefit from continued skilled PT to increase functional independence and activity tolerance. Will continue to follow acutely.    Follow Up Recommendations  CIR;Supervision/Assistance - 24 hour     Equipment Recommendations  None recommended by PT    Recommendations for Other Services Rehab consult     Precautions / Restrictions Precautions Precautions: Fall;Other (comment) Precaution Comments: suicide precautions  Restrictions Weight Bearing Restrictions: No    Mobility  Bed Mobility Overal bed mobility: Needs Assistance Bed Mobility: Supine to Sit     Supine to sit: Min assist     General bed mobility comments: Pt able to elevate trunk and progress LEs off EOB by using UE to lift legs. Min A to progress hips forward to EOB. Pt c/o dizziness on rising into sitting. BP 137/72, HR 87bmp, SpO2 99  Transfers Overall transfer level: Needs assistance Equipment used: Rolling walker (2 wheeled) Transfers: Sit to/from Nurse, adulttand;Stand Pivot Transfers(x2) Sit to Stand: Max assist;+2 physical assistance(mod to rise, max to maintiain standing) Stand pivot transfers: Max assist;+2 physical  assistance       General transfer comment: Max A for lift assist and steadying upon standing. Verbal cues for safe hand placement. Pt VERY weak (RLE>LLE) and was unable to maintain standing for prolonged period secondary to knee buckling. Required assist with controlled descent to sitting.   Ambulation/Gait             General Gait Details: unsafe to attempt this session    Stairs            Wheelchair Mobility    Modified Rankin (Stroke Patients Only)       Balance Overall balance assessment: Needs assistance Sitting-balance support: No upper extremity supported;Feet supported Sitting balance-Leahy Scale: Fair Sitting balance - Comments: mod I   Standing balance support: Bilateral upper extremity supported;During functional activity Standing balance-Leahy Scale: Zero Standing balance comment: Reliant on BUE and external support and physical assist to maintain standing balance.                             Cognition Arousal/Alertness: Awake/alert Behavior During Therapy: WFL for tasks assessed/performed Overall Cognitive Status: Within Functional Limits for tasks assessed                                        Exercises      General Comments        Pertinent Vitals/Pain Pain Assessment: Faces Faces Pain Scale: Hurts even more Pain Location: IJ line and R hip  Pain Descriptors / Indicators: Aching;Operative site guarding Pain  Intervention(s): Monitored during session;Limited activity within patient's tolerance;Repositioned    Home Living                      Prior Function            PT Goals (current goals can now be found in the care plan section) Acute Rehab PT Goals Patient Stated Goal: to get up and move  PT Goal Formulation: With patient Time For Goal Achievement: 01/18/17 Potential to Achieve Goals: Good Progress towards PT goals: Progressing toward goals    Frequency    Min 3X/week      PT  Plan Current plan remains appropriate    Co-evaluation              AM-PAC PT "6 Clicks" Daily Activity  Outcome Measure  Difficulty turning over in bed (including adjusting bedclothes, sheets and blankets)?: A Lot Difficulty moving from lying on back to sitting on the side of the bed? : Unable Difficulty sitting down on and standing up from a chair with arms (e.g., wheelchair, bedside commode, etc,.)?: Unable Help needed moving to and from a bed to chair (including a wheelchair)?: Total Help needed walking in hospital room?: Total Help needed climbing 3-5 steps with a railing? : Total 6 Click Score: 7    End of Session Equipment Utilized During Treatment: Gait belt Activity Tolerance: Patient limited by pain;Other (comment)(dizziness ) Patient left: with call bell/phone within reach;in chair;with nursing/sitter in room(suicide sitter in room ) Nurse Communication: Mobility status PT Visit Diagnosis: Unsteadiness on feet (R26.81);Muscle weakness (generalized) (M62.81);Other symptoms and signs involving the nervous system (R29.898);Difficulty in walking, not elsewhere classified (R26.2)     Time: 3338-3291 PT Time Calculation (min) (ACUTE ONLY): 23 min  Charges:  $Therapeutic Activity: 23-37 mins                    G Codes:       Kallie Locks, [place] Pager [phone] Acute Rehab   Sheral Apley 01/05/2017, 2:37 PM

## 2017-01-05 NOTE — Progress Notes (Signed)
PT Cancellation Note  Patient Details Name: Debra [name] MRN: [id number] DOB: 04/22/1965   Cancelled Treatment:    Reason Eval/Treat Not Completed: Patient at procedure or test/unavailable. Pt currently in HD. Will check back if time allows.    Kallie Locks, PTA Pager [phone] Acute Rehab  Sheral Apley 01/05/2017, 10:19 AM

## 2017-01-05 NOTE — Care Management Note (Signed)
Case Management Note  Patient Details  Name: Debra [name] MRN: [id number] Date of Birth: 1966/02/27  Subjective/Objective:                    Action/Plan: PT recommending CIR. Patient is currently homeless. CM following for d/c disposition.  Expected Discharge Date:                  Expected Discharge Plan:  IP Rehab Facility  In-House Referral:  Clinical Social Work  Discharge planning Services  CM Consult  Post Acute Care Choice:    Choice offered to:     DME Arranged:    DME Agency:     HH Arranged:    HH Agency:     Status of Service:  In process, will continue to follow  If discussed at Long Length of Stay Meetings, dates discussed:    Additional Comments:  Kermit Balo, RN 01/05/2017, 8:49 AM

## 2017-01-05 NOTE — Progress Notes (Signed)
Inpatient Rehabilitation  Per PT request, patient was screened by Fae Pippin for appropriateness for an Inpatient Acute Rehab consult.  Discussed case with IP Rehab Director, Kandis Mannan.  Noted that patient is currently homeless and as per previous recommendations care giver support will again be needed.  Therefore, we recommend SNF for post acute therapy follow up.       Charlane Ferretti., CCC/SLP Admission Coordinator  [facility] Inpatient Rehabilitation  Cell [phone]

## 2017-01-05 NOTE — Consult Note (Addendum)
[facility] Psych Consult Progress Note  01/05/2017 5:34 PM Klani Caridi  MRN:  [id number]   Subjective:   Ms. Grima reports that her mood is not better. She continues to have suicidal thoughts. She reports feeling safe in the hospital. She reports an improvement in her sleep. She reports no side effects from Remeron. Pharmacy increased Remeron from 7.5 mg to 15 mg yesterday. She denies AVH. She denies any improvement in her vision. According to nursing, she has had one treatment of plasmapheresis.   Principal Problem: Major depressive disorder, single episode, severe without psychosis ([place]) Diagnosis:   Patient Active Problem List   Diagnosis Date Noted  . Major depressive disorder, single episode, severe without psychosis ([place]) [F32.2] 01/02/2017  . Multiple sclerosis exacerbation ([place]) [G35] 01/01/2017  . Dysthymia [F34.1] 12/27/2016  . Major depressive disorder, recurrent, severe with psychotic features ([place]) [F33.3]   . Multiple joint pain [M25.50]   . HTN (hypertension) [I10]   . Acute pain of left knee [M25.562]   . Neurogenic bladder [N31.9]   . Acute lower UTI [N39.0]   . Labile blood pressure [R09.89]   . Acute blood loss anemia [D62]   . Leukocytosis [D72.829]   . Labile blood glucose [R73.09]   . Hypertensive crisis [I16.9]   . Hypoglycemia [E16.2]   . Poorly controlled type 2 diabetes mellitus with peripheral neuropathy (HCC) [E11.42, E11.65]   . Uncontrolled hypertension [I10]   . Sleep disturbance [G47.9]   . Multiple sclerosis ([place]) [G35] 11/22/2016  . Thoracic root lesion [G54.3] 11/22/2016  . MS (multiple sclerosis) ([place]) [G35]   . Neuropathic pain [M79.2]   . Uncontrolled type 2 diabetes mellitus with peripheral neuropathy (HCC) [E11.42, E11.65]   . Urinary retention [R33.9]   . Medically noncompliant [Z91.19]   . Leg weakness, bilateral [R29.898] 11/20/2016  . Diabetic ketoacidosis without coma associated with type 1 diabetes mellitus ([place]) [E10.10]   . Leg  pain [M79.606]   . History of CVA (cerebrovascular accident) [Z86.73]   . 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 Benign essential HTN [I10]   . Hypertensive urgency [I16.0]   . AKI (acute kidney injury) ([place]) [N17.9]   . Fibromyalgia [M79.7] 11/07/2016  . Back pain [M54.9] 10/31/2016  . Stable angina pectoris ([place]) [I20.8]   . Vitamin D deficiency [E55.9] 08/31/2014  . 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 Hyperlipidemia [E78.5] 10/06/2013  . Depression [F32.9] 10/16/2012  . Bilateral knee pain [M25.561, M25.562] 02/16/2012  . Chronic diarrhea [K52.9] 10/19/2010  . Peripheral neuropathy [G62.9] 08/19/2010  . Type 1 diabetes mellitus ([place]) [E10.9] 07/06/2010  . Hypertension [I10] 07/06/2010  . Asthma [J45.909] 07/06/2010  . Migraine [G43.909] 07/06/2010   Total Time spent with patient: 30 minutes  Past Psychiatric History: Depression   Past Medical History:  Past Medical History:  Diagnosis Date  . Adenomatous colonic polyps   . Anemia    2005  . Anxiety    1990  . Arthritis   . Asthma    2000  . Cataract   . Depression 1990  . Difficult intubation    narrow airway  . Gastroesophageal Reflux Disease (GERD)   . Heart murmur    Birth  . Hyperlipidemia    2005  . Hypertension    1998  . Internal hemorrhoids 04/24/06   on colonoscopy  . Migraine   . Neuropathy of the hands & feet   . Restless  Leg Syndrome   . Right Ankle Fracture 10/06/2013  . Sleep paralysis   . Stable angina pectoris    2007: cath showing normal cors.   . Stroke 1990  . Type I Diabetes Mellitus 1988    Past Surgical History:  Procedure Laterality Date  . bilateral foot surgery    . BREAST SURGERY Left    biopsy left breast  . CARDIAC CATHETERIZATION  2007   around 2007 or 2008  . CESAREAN SECTION    . CHOLECYSTECTOMY    . COLONOSCOPY W/ BIOPSIES AND POLYPECTOMY    . ENDOMETRIAL  ABLATION W/ NOVASURE N/A 12/2009  . EYE SURGERY    . TRIGGER FINGER RELEASE     x 3   Family History:  Family History  Problem Relation Age of Onset  . Hypertension Mother   . Kidney disease Mother   . Hypertension Father   . Breast cancer Maternal Grandmother   . Prostate cancer Maternal Grandfather   . Ovarian cancer Paternal Grandmother   . Prostate cancer Paternal Grandfather   . Colon cancer Maternal Uncle        Family history of malignant neoplasm of gastrointestinal tract   Family Psychiatric  History: Denies  Social History:  Social History   Substance and Sexual Activity  Alcohol Use No  . Alcohol/week: 0.0 oz     Social History   Substance and Sexual Activity  Drug Use No   Comment: drug addict    Social History   Socioeconomic History  . Marital status: Divorced    Spouse name: None  . Number of children: 2  . Years of education: 47  . Highest education level: None  Social Needs  . Financial resource strain: None  . Food insecurity - worry: None  . Food insecurity - inability: None  . Transportation needs - medical: None  . Transportation needs - non-medical: None  Occupational History  . Occupation: Unemployed  Tobacco Use  . Smoking status: Never Smoker  . Smokeless tobacco: Never Used  Substance and Sexual Activity  . Alcohol use: No    Alcohol/week: 0.0 oz  . Drug use: No    Comment: drug addict  . Sexual activity: Yes    Birth control/protection: Other-see comments    Comment: pt states she had ablation in 2011  Other Topics Concern  . None  Social History Narrative   Lost medicaid about 2009 ish when her youngest child turned 68. Has adult children. Lives with boyfriend who financially supports her. Has attempted to get disability but has been turned down.      2-3 caffeine drinks daily     Sleep: Good  Appetite:  Fair  Current Medications: Current Facility-Administered Medications  Medication Dose Route Frequency Provider  Last Rate Last Dose  . acetaminophen (TYLENOL) tablet 650 mg  650 mg Oral Q4H PRN Patrecia Pour, NP   650 mg at 01/03/17 1251  . albuterol (PROVENTIL) (2.5 MG/3ML) 0.083% nebulizer solution 2.5 mg  2.5 mg Inhalation Q4H PRN Patrecia Pour, NP      . alum & mag hydroxide-simeth (MAALOX/MYLANTA) 200-200-20 MG/5ML suspension 30 mL  30 mL Oral Q4H PRN Patrecia Pour, NP      . amLODipine (NORVASC) tablet 10 mg  10 mg Oral Daily Tawny Asal, MD   10 mg at 01/05/17 1151  . aspirin EC tablet 81 mg  81 mg Oral Daily Aldine Contes, MD   81 mg at 01/05/17 1152  .  atorvastatin (LIPITOR) tablet 40 mg  40 mg Oral Daily Aldine Contes, MD   40 mg at 01/05/17 1153  . bethanechol (URECHOLINE) tablet 50 mg  50 mg Oral TID Patrecia Pour, NP   50 mg at 01/05/17 1151  . citrate dextrose (ACD-A anticoagulant) 0.73-2.45-2.2 GM/100ML solution           . DULoxetine (CYMBALTA) DR capsule 60 mg  60 mg Oral Daily Tawny Asal, MD   60 mg at 01/05/17 1153  . gabapentin (NEURONTIN) capsule 600 mg  600 mg Oral BID Tawny Asal, MD   600 mg at 01/05/17 1152  . heparin injection 5,000 Units  5,000 Units Subcutaneous Q8H Tawny Asal, MD   5,000 Units at 01/05/17 1201  . hydrALAZINE (APRESOLINE) tablet 75 mg  75 mg Oral Q8H Patrecia Pour, NP   75 mg at 01/05/17 1419  . insulin aspart (novoLOG) injection 0-5 Units  0-5 Units Subcutaneous QHS Tawny Asal, MD      . insulin aspart (novoLOG) injection 10 Units  10 Units Subcutaneous TID WC Aldine Contes, MD   10 Units at 01/05/17 1245  . insulin glargine (LANTUS) injection 20 Units  20 Units Subcutaneous BID Tawny Asal, MD   20 Units at 01/05/17 1153  . magnesium hydroxide (MILK OF MAGNESIA) suspension 30 mL  30 mL Oral Daily PRN Patrecia Pour, NP      . metoprolol succinate (TOPROL-XL) 24 hr tablet 75 mg  75 mg Oral Daily Tawny Asal, MD   75 mg at 01/05/17 1153  . mirtazapine (REMERON) tablet 15 mg  15 mg Oral QHS Tawny Asal, MD   15 mg at  01/04/17 2256  . ondansetron (ZOFRAN) tablet 4 mg  4 mg Oral Q8H PRN Patrecia Pour, NP   4 mg at 01/05/17 1211  . pantoprazole (PROTONIX) EC tablet 40 mg  40 mg Oral Daily Aldine Contes, MD   40 mg at 01/05/17 1151  . sodium chloride flush (NS) 0.9 % injection 10-40 mL  10-40 mL Intracatheter PRN Aldine Contes, MD   10 mL at 01/04/17 0655  . sodium chloride flush (NS) 0.9 % injection 3 mL  3 mL Intravenous Q12H Burgess Estelle, MD   3 mL at 01/05/17 1155  . zolpidem (AMBIEN) tablet 5 mg  5 mg Oral QHS PRN Patrecia Pour, NP        Lab Results:  Results for orders placed or performed during the hospital encounter of 01/02/17 (from the past 48 hour(s))  Glucose, capillary     Status: Abnormal   Collection Time: 01/03/17  9:25 PM  Result Value Ref Range   Glucose-Capillary 284 (H) 65 - 99 mg/dL   Comment 1 Notify RN    Comment 2 Document in Chart   Urinalysis, Routine w reflex microscopic     Status: Abnormal   Collection Time: 01/04/17  5:00 AM  Result Value Ref Range   Color, Urine YELLOW YELLOW   APPearance CLEAR CLEAR   Specific Gravity, Urine 1.012 1.005 - 1.030   pH 6.0 5.0 - 8.0   Glucose, UA 150 (A) NEGATIVE mg/dL   Hgb urine dipstick NEGATIVE NEGATIVE   Bilirubin Urine NEGATIVE NEGATIVE   Ketones, ur NEGATIVE NEGATIVE mg/dL   Protein, ur NEGATIVE NEGATIVE mg/dL   Nitrite NEGATIVE NEGATIVE   Leukocytes, UA NEGATIVE NEGATIVE  Glucose, capillary     Status: Abnormal   Collection Time: 01/04/17  6:17 AM  Result Value Ref Range  Glucose-Capillary 275 (H) 65 - 99 mg/dL   Comment 1 Notify RN    Comment 2 Document in Chart   Basic metabolic panel     Status: Abnormal   Collection Time: 01/04/17  8:07 AM  Result Value Ref Range   Sodium 139 135 - 145 mmol/L   Potassium 4.0 3.5 - 5.1 mmol/L   Chloride 107 101 - 111 mmol/L   CO2 23 22 - 32 mmol/L   Glucose, Bld 341 (H) 65 - 99 mg/dL   BUN 32 (H) 6 - 20 mg/dL   Creatinine, Ser 1.72 (H) 0.44 - 1.00 mg/dL   Calcium  8.9 8.9 - 10.3 mg/dL   GFR calc non Af Amer 34 (L) >60 mL/min   GFR calc Af Amer 39 (L) >60 mL/min    Comment: (NOTE) The eGFR has been calculated using the CKD EPI equation. This calculation has not been validated in all clinical situations. eGFR's persistently <60 mL/min signify possible Chronic Kidney Disease.    Anion gap 9 5 - 15  CBC     Status: Abnormal   Collection Time: 01/04/17  8:07 AM  Result Value Ref Range   WBC 8.1 4.0 - 10.5 K/uL   RBC 3.86 (L) 3.87 - 5.11 MIL/uL   Hemoglobin 11.3 (L) 12.0 - 15.0 g/dL   HCT 35.7 (L) 36.0 - 46.0 %   MCV 92.5 78.0 - 100.0 fL   MCH 29.3 26.0 - 34.0 pg   MCHC 31.7 30.0 - 36.0 g/dL   RDW 14.8 11.5 - 15.5 %   Platelets 226 150 - 400 K/uL  C difficile quick scan w PCR reflex     Status: None   Collection Time: 01/04/17  9:57 AM  Result Value Ref Range   C Diff antigen NEGATIVE NEGATIVE   C Diff toxin NEGATIVE NEGATIVE   C Diff interpretation No C. difficile detected.   Glucose, capillary     Status: Abnormal   Collection Time: 01/04/17 12:06 PM  Result Value Ref Range   Glucose-Capillary 316 (H) 65 - 99 mg/dL   Comment 1 Notify RN    Comment 2 Document in Chart   Glucose, capillary     Status: Abnormal   Collection Time: 01/04/17  4:43 PM  Result Value Ref Range   Glucose-Capillary 239 (H) 65 - 99 mg/dL   Comment 1 Notify RN    Comment 2 Document in Chart   Glucose, capillary     Status: Abnormal   Collection Time: 01/04/17  9:35 PM  Result Value Ref Range   Glucose-Capillary 183 (H) 65 - 99 mg/dL   Comment 1 Notify RN    Comment 2 Document in Chart   Basic metabolic panel     Status: Abnormal   Collection Time: 01/05/17  7:33 AM  Result Value Ref Range   Sodium 138 135 - 145 mmol/L   Potassium 4.0 3.5 - 5.1 mmol/L   Chloride 108 101 - 111 mmol/L   CO2 24 22 - 32 mmol/L   Glucose, Bld 153 (H) 65 - 99 mg/dL   BUN 33 (H) 6 - 20 mg/dL   Creatinine, Ser 1.80 (H) 0.44 - 1.00 mg/dL   Calcium 8.9 8.9 - 10.3 mg/dL   GFR calc  non Af Amer 32 (L) >60 mL/min   GFR calc Af Amer 37 (L) >60 mL/min    Comment: (NOTE) The eGFR has been calculated using the CKD EPI equation. This calculation has not been validated in all  clinical situations. eGFR's persistently <60 mL/min signify possible Chronic Kidney Disease.    Anion gap 6 5 - 15  I-STAT, chem 8     Status: Abnormal   Collection Time: 01/05/17  9:33 AM  Result Value Ref Range   Sodium 140 135 - 145 mmol/L   Potassium 3.9 3.5 - 5.1 mmol/L   Chloride 103 101 - 111 mmol/L   BUN 30 (H) 6 - 20 mg/dL   Creatinine, Ser 1.70 (H) 0.44 - 1.00 mg/dL   Glucose, Bld 179 (H) 65 - 99 mg/dL   Calcium, Ion 1.25 1.15 - 1.40 mmol/L   TCO2 24 22 - 32 mmol/L   Hemoglobin 9.9 (L) 12.0 - 15.0 g/dL   HCT 29.0 (L) 36.0 - 46.0 %  Glucose, capillary     Status: Abnormal   Collection Time: 01/05/17 12:09 PM  Result Value Ref Range   Glucose-Capillary 123 (H) 65 - 99 mg/dL  Glucose, capillary     Status: None   Collection Time: 01/05/17  4:54 PM  Result Value Ref Range   Glucose-Capillary 73 65 - 99 mg/dL    Blood Alcohol level:  Lab Results  Component Value Date   ETH <10 12/20/2016   ETH  12/09/2006    <5        LOWEST DETECTABLE LIMIT FOR SERUM ALCOHOL IS 11 mg/dL FOR MEDICAL PURPOSES ONLY    Musculoskeletal: Strength & Muscle Tone: Bilateral lower extremity weakness Gait & Station: UTA since patient is sitting in a chair. Patient leans: N/A  Psychiatric Specialty Exam: Physical Exam  Nursing note and vitals reviewed. Constitutional: She is oriented to person, place, and time. She appears well-developed and well-nourished.  HENT:  Head: Normocephalic and atraumatic.  Neck: Normal range of motion.  Respiratory: Effort normal.  Musculoskeletal: Normal range of motion.  Neurological: She is alert and oriented to person, place, and time.  Skin: No rash noted.  Psychiatric: Her behavior is normal. Judgment and thought content normal.    Review of Systems   Constitutional: Negative for chills and fever.  Eyes: Positive for blurred vision.  Gastrointestinal: Negative for nausea and vomiting.  Psychiatric/Behavioral: Positive for depression and suicidal ideas. Negative for hallucinations and substance abuse. The patient is not nervous/anxious and does not have insomnia.     Blood pressure 137/72, pulse 79, temperature 99.3 F (37.4 C), temperature source Oral, resp. rate 16, height '5\' 4"'$  (1.626 m), weight 72.6 kg (160 lb 0.9 oz), SpO2 98 %.Body mass index is 27.47 kg/m.  General Appearance: Well Groomed, middle aged, African American female with corrective lenses and a hospital gown. NAD.   Eye Contact:  Good  Speech:  Normal Rate  Volume:  Decreased  Mood:  Depressed and Dysphoric  Affect:  Congruent and Depressed  Thought Process:  Goal Directed and Linear  Orientation:  Full (Time, Place, and Person)  Thought Content:  Logical  Suicidal Thoughts:  Yes.  without intent/plan  Homicidal Thoughts:  No  Memory:  Immediate;   Good Recent;   Good Remote;   Good  Judgement:  Good  Insight:  Good  Psychomotor Activity:  Decreased  Concentration:  Concentration: Good and Attention Span: Good  Recall:  Good  Fund of Knowledge:  Good  Language:  Good  Akathisia:  No  Handed:  Right  AIMS (if indicated):   N/A  Assets:  Communication Skills Housing  ADL's:  Intact  Cognition:  WNL  Sleep:   Improved.    Assessment:  Debra [name] is a 51 y.o. female who was admitted with acute MS from [facility] for plasma exchange treatment due to worsening vision that was not improved with Solu-Medrol. She continues to endorse SI with depressive symptoms that have not improved since hospitalization or following treatment at [facility] prior to transfer to treat acute MS. She warrants inpatient psychiatric hospitalization once she is medically cleared due to high risk of harm to self.    Treatment Plan Summary: -Continue bedside sitter due to ongoing SI and  risk of harm to self.  -Continue Cymbalta 60 mg daily for depression. -Continue Remeron 15 mg qhs for sleep, anxiety and depression.  -Patient requires inpatient psychiatric hospitalization following medical clearance given continued SI. -Will sign off on patient at this time. Please consult psychiatry again as needed.   Faythe Dingwall, DO 01/05/2017, 5:34 PM

## 2017-01-06 DIAGNOSIS — Z95828 Presence of other vascular implants and grafts: Secondary | ICD-10-CM

## 2017-01-06 LAB — COMPREHENSIVE METABOLIC PANEL
ALT: 27 U/L (ref 14–54)
ANION GAP: 5 (ref 5–15)
AST: 37 U/L (ref 15–41)
Albumin: 3.7 g/dL (ref 3.5–5.0)
Alkaline Phosphatase: 47 U/L (ref 38–126)
BILIRUBIN TOTAL: 0.6 mg/dL (ref 0.3–1.2)
BUN: 26 mg/dL — ABNORMAL HIGH (ref 6–20)
CO2: 24 mmol/L (ref 22–32)
Calcium: 8.5 mg/dL — ABNORMAL LOW (ref 8.9–10.3)
Chloride: 107 mmol/L (ref 101–111)
Creatinine, Ser: 1.59 mg/dL — ABNORMAL HIGH (ref 0.44–1.00)
GFR calc Af Amer: 43 mL/min — ABNORMAL LOW (ref >60)
GFR, EST NON AFRICAN AMERICAN: 37 mL/min — AB (ref >60)
Glucose, Bld: 248 mg/dL — ABNORMAL HIGH (ref 65–99)
POTASSIUM: 4.4 mmol/L (ref 3.5–5.1)
Sodium: 136 mmol/L (ref 135–145)
TOTAL PROTEIN: 5.1 g/dL — AB (ref 6.5–8.1)

## 2017-01-06 LAB — GLUCOSE, CAPILLARY
GLUCOSE-CAPILLARY: 190 mg/dL — AB (ref 65–99)
GLUCOSE-CAPILLARY: 226 mg/dL — AB (ref 65–99)
GLUCOSE-CAPILLARY: 172 mg/dL — AB (ref 65–99)
GLUCOSE-CAPILLARY: 105 mg/dL — AB (ref 65–99)

## 2017-01-06 LAB — CBC
HEMATOCRIT: 30.8 % — AB (ref 36.0–46.0)
Hemoglobin: 9.9 g/dL — ABNORMAL LOW (ref 12.0–15.0)
MCH: 28.9 pg (ref 26.0–34.0)
MCHC: 32.1 g/dL (ref 30.0–36.0)
MCV: 90.1 fL (ref 78.0–100.0)
Platelets: 191 10*3/uL (ref 150–400)
RBC: 3.42 MIL/uL — ABNORMAL LOW (ref 3.87–5.11)
RDW: 14.4 % (ref 11.5–15.5)
WBC: 9 10*3/uL (ref 4.0–10.5)

## 2017-01-06 LAB — BASIC METABOLIC PANEL
ANION GAP: 6 (ref 5–15)
BUN: 27 mg/dL — ABNORMAL HIGH (ref 6–20)
CALCIUM: 8.9 mg/dL (ref 8.9–10.3)
CO2: 24 mmol/L (ref 22–32)
Chloride: 110 mmol/L (ref 101–111)
Creatinine, Ser: 1.6 mg/dL — ABNORMAL HIGH (ref 0.44–1.00)
GFR, EST AFRICAN AMERICAN: 43 mL/min — AB (ref >60)
GFR, EST NON AFRICAN AMERICAN: 37 mL/min — AB (ref >60)
Glucose, Bld: 186 mg/dL — ABNORMAL HIGH (ref 65–99)
POTASSIUM: 4.3 mmol/L (ref 3.5–5.1)
Sodium: 140 mmol/L (ref 135–145)

## 2017-01-06 MED ORDER — SODIUM CHLORIDE 0.9 % IV SOLN
INTRAVENOUS | Status: AC
  Administered 2017-01-06 (×3): via INTRAVENOUS_CENTRAL
  Filled 2017-01-06 (×3): qty 200

## 2017-01-06 MED ORDER — CALCIUM CARBONATE ANTACID 500 MG PO CHEW
CHEWABLE_TABLET | ORAL | Status: AC
  Administered 2017-01-06: 400 mg via ORAL
  Filled 2017-01-06: qty 4

## 2017-01-06 MED ORDER — DIPHENHYDRAMINE HCL 25 MG PO CAPS: 25.0000 mg | ORAL_CAPSULE | Freq: Four times a day (QID) | ORAL | Status: DC | PRN

## 2017-01-06 MED ORDER — ACD FORMULA A 0.73-2.45-2.2 GM/100ML VI SOLN
Status: AC
  Filled 2017-01-06: qty 500

## 2017-01-06 MED ORDER — ACD FORMULA A 0.73-2.45-2.2 GM/100ML VI SOLN
500.0000 mL | Status: DC
  Filled 2017-01-06: qty 500

## 2017-01-06 MED ORDER — SODIUM CHLORIDE 0.9 % IV SOLN
2.0000 g | Freq: Once | INTRAVENOUS | Status: AC
  Administered 2017-01-06: 2 g via INTRAVENOUS
  Filled 2017-01-06: qty 20

## 2017-01-06 MED ORDER — HEPARIN SODIUM (PORCINE) 1000 UNIT/ML IJ SOLN
1000.0000 [IU] | Freq: Once | INTRAMUSCULAR | Status: DC
  Filled 2017-01-06: qty 1

## 2017-01-06 MED ORDER — CALCIUM CARBONATE ANTACID 500 MG PO CHEW
2.0000 | CHEWABLE_TABLET | ORAL | Status: DC
  Administered 2017-01-06: 400 mg via ORAL

## 2017-01-06 MED ORDER — ACETAMINOPHEN 325 MG PO TABS: 650.0000 mg | ORAL_TABLET | ORAL | Status: DC | PRN

## 2017-01-06 NOTE — Progress Notes (Signed)
Reason for consult:   Subjective: Patient is found sitting in bed in NAD. No family at bedside. Recently returned from day 2 of plasmapheresis. States her vision is much improved. But continues to have blurred vision with "spots". LE strength unchanged.   ROS: negative, diarhea slowly resolving  Examination  Vital signs in last 24 hours: Temp:  [97.5 F (36.4 C)-99.3 F (37.4 C)] 99 F (37.2 C) (11/10 1139) Pulse Rate:  [73-84] 84 (11/10 1139) Resp:  [10-20] 16 (11/10 1139) BP: (130-180)/(63-85) 150/78 (11/10 1139) SpO2:  [98 %-99 %] 99 % (11/10 1139)  General: Not in distress, cooperative CVS: pulse-normal rate and rhythm RS: breathing comfortably Extremities: normal   Neurological Examination Mental Status: Alert, oriented, thought content appropriate.  Speech fluent without evidence of aphasia.  Able to follow 3 step commands without difficulty. Cranial Nerves: II: Decreased vision with in both eyes. Patient is able to count fingers and recognize colors III,IV, VI: ptosis not present, extra-ocular motions intact bilaterally pupils equal, round, minimally reactive to light and accommodation V,VII: smile symmetric, facial light touch sensation normal bilaterally VIII: hearing normal bilaterally IX,X: uvula rises symmetrically XI: bilateral shoulder shrug XII: midline tongue extension Motor: Right :  Upper extremity   4+/5                                      Left:     Upper extremity   4+/5             Lower extremity   3/5                                                  Lower extremity   3+/5 Tone and bulk:normal tone throughout; no atrophy noted Sensory: Pinprick and light touch intact throughout, bilaterally Deep Tendon Reflexes: diminished lower extremity reflexes  Plantars: Right: downgoing                                Left: downgoing Cerebellar: normal finger-to-nose,  Gait: Not tested  Basic Metabolic Panel: Recent Labs  Lab 01/03/17 0358  01/04/17 0807  01/05/17 0733 01/05/17 0933 01/06/17 0409 01/06/17 0930  NA 134*   < > 139 138 140 140 136  K 4.4   < > 4.0 4.0 3.9 4.3 4.4  CL 99*   < > 107 108 103 110 107  CO2 26  --  23 24  --  24 24  GLUCOSE 350*   < > 341* 153* 179* 186* 248*  BUN 39*   < > 32* 33* 30* 27* 26*  CREATININE 1.83*   < > 1.72* 1.80* 1.70* 1.60* 1.59*  CALCIUM 8.8*  --  8.9 8.9  --  8.9 8.5*   < > = values in this interval not displayed.    CBC: Recent Labs  Lab 01/02/17 0410 01/03/17 0358 01/03/17 1443 01/04/17 0807 01/05/17 0933 01/06/17 0930  WBC 10.0 8.9  --  8.1  --  9.0  HGB 11.4* 10.8* 10.9* 11.3* 9.9* 9.9*  HCT 34.7* 33.6* 32.0* 35.7* 29.0* 30.8*  MCV 90.5 90.6  --  92.5  --  90.1  PLT 231 230  --  226  --  191   Coagulation Studies: No results for input(s): LABPROT, INR in the last 72 hours.  Imaging MRI Brain 12/31/16 IMPRESSION: 1. New T2 hyperintense lesion within the genu of the corpus callosum with restricted diffusion compatible with acute demyelination. 2. Multiple other periventricular T2 hyperintensities with diffuse involvement of the colossal septal margin and extension into the corona radiata bilaterally compatible with a demyelinating process. Multiple lesions demonstrate active demyelination evidence by restricted diffusion and subtle enhancement. 3. Progression of diffuse sinusitis.  ASSESSMENT: Multiple Sclerosis, potential NMO: This is a 51 year old female with recent diagnosis of MS who presents with a relapse. MRI shows multiple new enhancing lesions. The patient has recently received coarse of IV steroids with no response. She was transferred from [facility] to [facility] to receive plasmapheresis. Studies have shown plasmapheresis has been beneficial in treatment of refractory MS. Also there is a concern the patient may have NMO which also responds to plasmapheresis. She tolerated first day of plasmapheresis. LP was performed yesterday and protein elevated at 94.   PLAN Tolerated  Day two of plasmapheresis - vision improving, LE's remain weak Continue Plasmapheresis, scheduled on 11/9, 11/10, 11/12, 1/14 F/U CSF for oligoclonal bands, NMO ab - still pending Continue PT/OT  AKI Creatinine 1.60 yesterday, 1.59 today - trending down slowly IVF's in progress  Beryl Meager, ANP-C [facility] Team    NEUROHOSPITALIST ADDENDUM Seen and examined the patient this AM. Formulated plan as documented above. Recommendations as above. Will follow.  Georgiana Spinner Aroor MD [facility] [phone]  If 7pm to 7am, please call on call as listed on AMION. l

## 2017-01-06 NOTE — Progress Notes (Signed)
OT Cancellation Note  Patient Details Name: Debra [name] MRN: [id number] DOB: Dec 02, 1965   Cancelled Treatment:    Reason Eval/Treat Not Completed: Patient at procedure or test/ unavailable  Earlie Raveling OTR/L 01/06/2017, 9:33 AM

## 2017-01-06 NOTE — Progress Notes (Signed)
   Subjective: No acute events overnight, the pt reports slight improvement in her vision and stable LE weakness compared to yesterday. She notes being irritated with IJ line, no other specific complaints. She is due to have plasmapheresis again today.   Objective:  Vital signs in last 24 hours: Vitals:   01/05/17 2142 01/06/17 0100 01/06/17 0408 01/06/17 0500  BP: (!) 149/75 (!) 152/80 (!) 172/85 (!) 164/79  Pulse: 73 78 79 83  Resp: 17 20 18 20   Temp: 97.7 F (36.5 C) 98.3 F (36.8 C) 98.2 F (36.8 C) (!) 97.5 F (36.4 C)  TempSrc: Oral Oral Oral Oral  SpO2: 99%  98%   Weight:      Height:       General: Resting in bed comfortably, no acute distress CV: Systolic murmur, normal rate and rhythm Resp: Lungs clear, normal work of breathing, no distress  Abd: Soft, +BS, non-tender Extr: No swelling Neuro: Alert and oriented x3, stable +3/5 LE strength bilaterally, 5/5 UEs Skin: Warm, dry. R IJ central line in place with clean dressing     Assessment/Plan:  Multiple Sclerosis, potential NMO  Patient has history of multiple sclerosis with lower extremity weakness and bladder/bowel incontinence which have worsened, as well as new vision changes.  MRI shows new demyelinating lesions.  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  She received IV steroids x2 doses and plasmapheresis has been initiated, sessions scheduled for 11/9, 11/10, 11/12, 11/14. LP performed showed increased protein, NMO Ab negative. Oligoclonal bands pending. Neurology consulted and following.  --Plasmapheresis per Neurology --F/u oligoclonal bands  --Neuro checks   Major Depressive Disorder Patient has a history of severe depression with psychotic features which is required inpatient psychiatric admission.  She was expressing suicidal ideation on admission, no significant change in mood.  She was previously on Cymbalta 60 mg and Risperdal 1 mg based on psychiatric evaluation at outside hospital.   Psych consulted here, appreciate updated recommendations. --Continue Cymbalta 60 mg, Remeron 15 mg qhs  --Inpatient psych admission following medical stabilization   Acute Kidney Injury Patient presented with elevated creatinine to 2.29 compared to prior values around 1.5.  Given IV hydration with improvement in creatinine, approaching baseline, currently 1.60.  --Avoid nephrotoxic agents --BMP   H/o Diabetes Patient has a history of diabetes with peripheral neuropathy.  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 Foley placed and will perform a voiding trial as her other neurologic deficits improve.   Dispo: Anticipated discharge in approximately 5-6 day(s).   [name], MD 01/06/2017, 7:13 AM Pager: [phone]

## 2017-01-06 NOTE — Plan of Care (Signed)
  Progressing Education: Knowledge of General Education information will improve 01/06/2017 1541 - Progressing by Cordie Grice, RN Health Behavior/Discharge Planning: Ability to manage health-related needs will improve 01/06/2017 1541 - Progressing by Cordie Grice, RN Clinical Measurements: Ability to maintain clinical measurements within normal limits will improve 01/06/2017 1541 - Progressing by Cordie Grice, RN Will remain free from infection 01/06/2017 1541 - Progressing by Cordie Grice, RN Diagnostic test results will improve 01/06/2017 1541 - Progressing by Cordie Grice, RN Respiratory complications will improve 01/06/2017 1541 - Progressing by Cordie Grice, RN Cardiovascular complication will be avoided 01/06/2017 1541 - Progressing by Cordie Grice, RN Activity: Risk for activity intolerance will decrease 01/06/2017 1541 - Progressing by Cordie Grice, RN Nutrition: Adequate nutrition will be maintained 01/06/2017 1541 - Progressing by Cordie Grice, RN Coping: Level of anxiety will decrease 01/06/2017 1541 - Progressing by Cordie Grice, RN Elimination: Will not experience complications related to bowel motility 01/06/2017 1541 - Progressing by Cordie Grice, RN Will not experience complications related to urinary retention 01/06/2017 1541 - Progressing by Cordie Grice, RN Pain Managment: General experience of comfort will improve 01/06/2017 1541 - Progressing by Cordie Grice, RN Safety: Ability to remain free from injury will improve 01/06/2017 1541 - Progressing by Cordie Grice, RN Skin Integrity: Risk for impaired skin integrity will decrease 01/06/2017 1541 - Progressing by Cordie Grice, RN Spiritual Needs Ability to function at adequate level 01/06/2017 1541 - Progressing by Cordie Grice, RN

## 2017-01-07 LAB — GLUCOSE, CAPILLARY
GLUCOSE-CAPILLARY: 200 mg/dL — AB (ref 65–99)
Glucose-Capillary: 174 mg/dL — ABNORMAL HIGH (ref 65–99)
Glucose-Capillary: 177 mg/dL — ABNORMAL HIGH (ref 65–99)
Glucose-Capillary: 196 mg/dL — ABNORMAL HIGH (ref 65–99)

## 2017-01-07 LAB — CSF CULTURE W GRAM STAIN

## 2017-01-07 LAB — CSF CULTURE
CULTURE: NO GROWTH
SPECIAL REQUESTS: NORMAL

## 2017-01-07 MED ORDER — ASPIRIN 325 MG PO TABS
ORAL_TABLET | ORAL | Status: AC
  Administered 2017-01-07: 325 mg
  Filled 2017-01-07: qty 1

## 2017-01-07 NOTE — Progress Notes (Addendum)
[facility] Progress Note Subjective: Patient is found sitting in bed in NAD. No family at bedside, sitter at bedside for suicide watch. States slept well. again states her vision is much improved. But occasionally has some blurred vision with "spots". LE strength unchanged.   ROS: negative, diarhea slowly resolving  Examination  Vital signs in last 24 hours: Temp:  [98 F (36.7 C)-98.9 F (37.2 C)] 98.4 F (36.9 C) (11/11 1019) Pulse Rate:  [75-84] 84 (11/11 1019) Resp:  [16-18] 18 (11/11 1019) BP: (117-153)/(44-74) 149/67 (11/11 1019) SpO2:  [98 %-100 %] 98 % (11/11 1019)  General: Not in distress, cooperative CVS: pulse-normal rate and rhythm RS: breathing comfortably Extremities: normal   Neurological Examination Mental Status: Alert, oriented, thought content appropriate.  Speech fluent without evidence of aphasia.  Able to follow 3 step commands without difficulty. Cranial Nerves: II: Decreased vision with in both eyes. Patient is able to count fingers and recognize colors III,IV, VI: ptosis not present, extra-ocular motions intact bilaterally pupils equal, round, minimally reactive to light and accommodation V,VII: smile symmetric, facial light touch sensation normal bilaterally VIII: hearing normal bilaterally IX,X: uvula rises symmetrically XI: bilateral shoulder shrug XII: midline tongue extension Motor: Some give way on strength testing today Right :  Upper extremity   4+/5                                      Left:     Upper extremity   4+/5             Lower extremity   3/5                                                  Lower extremity   3+/5 Tone and bulk:normal tone throughout; no atrophy noted Sensory: Pinprick and light touch intact throughout, bilaterally Deep Tendon Reflexes: diminished lower extremity reflexes  Plantars: Right: downgoing                                Left: downgoing Cerebellar: normal finger-to-nose,  Gait: Not tested  Basic  Metabolic Panel: Recent Labs  Lab 01/03/17 0358  01/04/17 0807 01/05/17 0733 01/05/17 0933 01/06/17 0409 01/06/17 0930  NA 134*   < > 139 138 140 140 136  K 4.4   < > 4.0 4.0 3.9 4.3 4.4  CL 99*   < > 107 108 103 110 107  CO2 26  --  23 24  --  24 24  GLUCOSE 350*   < > 341* 153* 179* 186* 248*  BUN 39*   < > 32* 33* 30* 27* 26*  CREATININE 1.83*   < > 1.72* 1.80* 1.70* 1.60* 1.59*  CALCIUM 8.8*  --  8.9 8.9  --  8.9 8.5*   < > = values in this interval not displayed.    CBC: Recent Labs  Lab 01/02/17 0410 01/03/17 0358 01/03/17 1443 01/04/17 0807 01/05/17 0933 01/06/17 0930  WBC 10.0 8.9  --  8.1  --  9.0  HGB 11.4* 10.8* 10.9* 11.3* 9.9* 9.9*  HCT 34.7* 33.6* 32.0* 35.7* 29.0* 30.8*  MCV 90.5 90.6  --  92.5  --  90.1  PLT 231  230  --  226  --  191   Coagulation Studies: No results for input(s): LABPROT, INR in the last 72 hours.  Imaging MRI Brain 12/31/16 IMPRESSION: 1. New T2 hyperintense lesion within the genu of the corpus callosum with restricted diffusion compatible with acute demyelination. 2. Multiple other periventricular T2 hyperintensities with diffuse involvement of the colossal septal margin and extension into the corona radiata bilaterally compatible with a demyelinating process. Multiple lesions demonstrate active demyelination evidence by restricted diffusion and subtle enhancement. 3. Progression of diffuse sinusitis.  ASSESSMENT: Multiple Sclerosis,  This is a 51 year old female with recent diagnosis of MS who presents with a relapse. MRI shows multiple new enhancing lesions. The patient has recently received coarse of IV steroids with no response. She was transferred from [facility] to [facility] to receive plasmapheresis. Studies have shown plasmapheresis has been beneficial in treatment of refractory MS. Also there is a concern the patient may have NMO which also responds to plasmapheresis. She tolerated first day of plasmapheresis. LP was performed  yesterday and protein elevated at 94. NMO antibody negative.   PLAN Day 3 of plasmapheresis scheduled for tomorrow - vision improving, LE's remain weak Continue Plasmapheresis, scheduled on 11/9, 11/10, 11/12, 1/14 F/U CSF for oligoclonal bands - PENDING, NMO ab - Negative Continue PT/OT  AKI trending down slowly, no labs today IVF's in progress  Beryl Meager, ANP-C [facility] Team    NEUROHOSPITALIST ADDENDUM Seen and examined the patient this AM. Vision continues to improve and there has been mild improvement in the lower extremity strength. NMO ab negative. Will continue 2 more days of plasmapheresis as well as work with PT OT. Is very important that we call the nurse of Dr. Epimenio Foot so she has a follow-up on discharge to start disease modifying therapy.   Georgiana Spinner Aroor MD [facility] [phone]  If 7pm to 7am, please call on call as listed on AMION.

## 2017-01-07 NOTE — Evaluation (Signed)
Occupational Therapy Evaluation Patient Details Name: Debra [name] MRN: [id number] DOB: 06-06-1965 Today's Date: 01/07/2017    History of Present Illness Pt is a 51 y/o female admitted to [facility] secondary to MS exacerbation and major depressive disorder. Pt transferred to [facility] for insertion of R IJ catheter and for plasmophoresis treatments. Pt also very depressed about recent decline in function and psychiatry following. PMH includes MS, HTN, DM with neuropathy, depression, heart murmur, and restless leg syndrome.    Clinical Impression   PTA, pt reports independence with ADL and functional mobility. Of note, she did report use of RW to PT per chart but verbalized to OT that she was not using an assistive device for ambulation. Pt currently requires mod assist for LB ADL with max assist +2 to stand to complete task, max assist +2 for toilet transfers, and min guard assist for UB ADL. She presents with decreased B LE strength, decreased B UE strength and coordination, and decreased activity tolerance for ADL impacting her ability to complete ADL at PLOF. She was very independent prior to admission and would benefit from CIR level therapies post-acute D/C to maximize return to PLOF. Will continue to follow while admitted.     Follow Up Recommendations  CIR;Supervision/Assistance - 24 hour    Equipment Recommendations  3 in 1 bedside commode    Recommendations for Other Services Rehab consult     Precautions / Restrictions Precautions Precautions: Fall;Other (comment) Precaution Comments: suicide precautions  Restrictions Weight Bearing Restrictions: No      Mobility Bed Mobility Overal bed mobility: Needs Assistance Bed Mobility: Supine to Sit     Supine to sit: Min assist     General bed mobility comments: Min assist to elevate trunk from [facility]  Transfers Overall transfer level: Needs assistance Equipment used: Rolling walker (2 wheeled) Transfers: Sit to/from [employer] Sit to Stand: Max assist;+2 physical assistance Stand pivot transfers: Max assist;+2 physical assistance       General transfer comment: Max assist to power up and steady. Unable to maintain standing position without buckling of knees. Assisted to pivot to chair.     Balance Overall balance assessment: Needs assistance Sitting-balance support: No upper extremity supported;Feet supported Sitting balance-Leahy Scale: Fair     Standing balance support: Bilateral upper extremity supported;During functional activity Standing balance-Leahy Scale: Zero Standing balance comment: Reliant on BUE and external support and physical assist to maintain standing balance.                            ADL either performed or assessed with clinical judgement   ADL Overall ADL's : Needs assistance/impaired Eating/Feeding: Sitting;Supervision/ safety   Grooming: Supervision/safety;Sitting   Upper Body Bathing: Sitting;Min guard   Lower Body Bathing: Moderate assistance;Sit to/from stand Lower Body Bathing Details (indicate cue type and reason): Max assist sit<>stand. Upper Body Dressing : Min guard;Sitting   Lower Body Dressing: Moderate assistance;Sit to/from stand Lower Body Dressing Details (indicate cue type and reason): Max assist sit<>stand. Toilet Transfer: Maximal assistance;+2 for physical assistance   Toileting- Clothing Manipulation and Hygiene: Maximal assistance;Sit to/from stand       Functional mobility during ADLs: Maximal assistance;+2 for physical assistance(stand-pivot only) General ADL Comments: Pt very motivated to return to independence.      Vision Baseline Vision/History: Wears glasses Wears Glasses: At all times Patient Visual Report: Blurring of vision(L upper quadrant) Vision Assessment?: Yes Eye Alignment: Within Functional Limits Ocular  Range of Motion: Within Functional Limits Alignment/Gaze Preference: Within Defined  Limits Tracking/Visual Pursuits: Able to track stimulus in all quads without difficulty Saccades: Within functional limits Visual Fields: Left visual field deficit(blurry in L superior field) Additional Comments: Pt with decreased vision in L upper quadrant visual field.  Reports when she looks "up" things are blurry.     Perception     Praxis      Pertinent Vitals/Pain Pain Assessment: Faces Faces Pain Scale: Hurts little more Pain Location: B LE Pain Descriptors / Indicators: Aching;Operative site guarding Pain Intervention(s): Limited activity within patient's tolerance;Monitored during session;Repositioned     Hand Dominance Right   Extremity/Trunk Assessment Upper Extremity Assessment Upper Extremity Assessment: RUE deficits/detail;LUE deficits/detail RUE Deficits / Details: Sensation in tact. Moves slowly with functional tasks such as self-feeding. Generalized weakness 3/5 grossly. Lag with rapidly alternating movements.  LUE Deficits / Details: Sensation in tact. Moves slowly with functional tasks such as self-feeding. Generalized weakness 3/5 grossly. Lag with rapidly alternating movements.    Lower Extremity Assessment Lower Extremity Assessment: Defer to PT evaluation   Cervical / Trunk Assessment Cervical / Trunk Assessment: Kyphotic   Communication Communication Communication: No difficulties   Cognition Arousal/Alertness: Awake/alert Behavior During Therapy: WFL for tasks assessed/performed Overall Cognitive Status: Within Functional Limits for tasks assessed                                     General Comments       Exercises     Shoulder Instructions      Home Living Family/patient expects to be discharged to:: Private residence Living Arrangements: Alone Available Help at Discharge: Other (Comment)(pt unsure) Type of Home: House Home Access: Stairs to enter Enter[employer] CorporationEntrance Stairs-Number of Steps: 5 Entrance Stairs-Rails: Right Home  Layout: One level     Bathroom Shower/Tub: Tub/shower unit;Door   Foot LockerBathroom Toilet: Standard     Home Equipment: Environmental consultantWalker - 2 wheels;Wheelchair - manual;Cane - single point;Tub bench          Prior Functioning/Environment Level of Independence: Independent with assistive device(s)        Comments: Reported use of RW for ambulation to PT but no AD to OT        OT Problem List: Decreased strength;Decreased activity tolerance;Impaired balance (sitting and/or standing);Decreased safety awareness;Decreased knowledge of use of DME or AE;Decreased cognition;Pain;Impaired vision/perception      OT Treatment/Interventions: Self-care/ADL training;Therapeutic exercise;Energy conservation;DME and/or AE instruction;Therapeutic activities;Patient/family education;Balance training;Visual/perceptual remediation/compensation    OT Goals(Current goals can be found in the care plan section) Acute Rehab OT Goals Patient Stated Goal: to get up and move  OT Goal Formulation: With patient Time For Goal Achievement: 01/21/17 Potential to Achieve Goals: Good ADL Goals Pt Will Perform Grooming: with modified independence;sitting Pt Will Perform Upper Body Dressing: with modified independence;sitting Pt Will Perform Lower Body Dressing: with min assist;sit to/from stand Pt Will Transfer to Toilet: with min assist;stand pivot transfer;bedside commode Pt Will Perform Toileting - Clothing Manipulation and hygiene: with min assist;sit to/from stand  OT Frequency: Min 3X/week   Barriers to D/C:            Co-evaluation              AM-PAC PT "6 Clicks" Daily Activity     Outcome Measure Help from another person eating meals?: A Little Help from another person taking care of personal grooming?: A Little  Help from another person toileting, which includes using toliet, bedpan, or urinal?: A Lot Help from another person bathing (including washing, rinsing, drying)?: A Lot Help from another person  to put on and taking off regular upper body clothing?: A Little Help from another person to put on and taking off regular lower body clothing?: A Lot 6 Click Score: 15   End of Session Equipment Utilized During Treatment: Gait belt Nurse Communication: Mobility status  Activity Tolerance: Patient tolerated treatment well Patient left: in chair;with call bell/phone within reach;with chair alarm set;with nursing/sitter in room  OT Visit Diagnosis: Other abnormalities of gait and mobility (R26.89);Muscle weakness (generalized) (M62.81);Low vision, both eyes (H54.2)                Time: 8295-6213 OT Time Calculation (min): 16 min Charges:  OT General Charges $OT Visit: 1 Visit OT Evaluation $OT Eval Moderate Complexity: 1 Mod G-Codes:     Doristine Section, MS OTR/L  Pager: [phone]   Mycah Formica A Yalexa Blust 01/07/2017, 9:33 AM

## 2017-01-07 NOTE — Progress Notes (Signed)
   Subjective: No acute events overnight, she reports her vision has continued to improve and she can read/see the television better.  Feels her lower extremity weakness is stable.  Her mood has not had a significant change and she continues to report suicidal ideation. Next plasmapheresis session scheduled for tomorrow   Objective:  Vital signs in last 24 hours: Vitals:   01/06/17 1750 01/06/17 2133 01/07/17 0605 01/07/17 1019  BP: (!) 148/74 (!) 117/44 (!) 153/72 (!) 149/67  Pulse: 81 75 81 84  Resp: 16 16  18   Temp: 98 F (36.7 C) 98.9 F (37.2 C) 98.5 F (36.9 C) 98.4 F (36.9 C)  TempSrc: Oral Oral Oral Oral  SpO2: 100% 98% 98% 98%  Weight:      Height:       General: Sitting in chair comfortably, no acute distress CV: Systolic murmur, normal rate and rhythm Resp: Lungs clear, normal work of breathing, no distress  Abd: Soft, +BS, non-tender Extr: No swelling Neuro: Alert and oriented x3, stable +3/5 proximal LE and 4/5 distal strength bilaterally, 5/5 UEs, reflexes remain diminished Skin: Warm, dry. R IJ central line in place with clean dressing     Assessment/Plan:  Multiple Sclerosis, potential NMO  Patient has history of multiple sclerosis with lower extremity weakness and bladder/bowel incontinence which worsened, also experienced new vision changes.  MRI showed new demyelinating lesions.  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  She received IV steroids x2 doses and plasmapheresis has been initiated, remaining sessions scheduled for 11/12, 11/14. LP performed showed increased protein, NMO Ab negative. Oligoclonal bands pending. Neurology consulted and following.  --Plasmapheresis per Neurology --F/u oligoclonal bands  --Neuro checks   Major Depressive Disorder Patient has a history of severe depression with psychotic features which is required inpatient psychiatric admission.  She was expressing suicidal ideation on admission, no significant change in  mood.  She was previously on Cymbalta 60 mg and Risperdal 1 mg based on psychiatric evaluation at outside hospital.  Psych consulted here, appreciate updated recommendations. --Continue Cymbalta 60 mg, Remeron 15 mg qhs  --Inpatient psych admission following medical stabilization   Acute Kidney Injury, resolved Patient presented with elevated creatinine to 2.29 compared to prior values around 1.5.  Given IV hydration with improvement in creatinine, approaching baseline, last at 1.59  --Avoid nephrotoxic agents  H/o Diabetes Patient has a history of diabetes with peripheral neuropathy.  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 Foley placed, will trial pt off of foley given overall improvement in neuro sx.  --Discontinue Foley --Rpt bladder scan if concern for retention following removal   Dispo: Anticipated discharge in approximately 4-5 day(s).   [name], MD 01/07/2017, 11:02 AM Pager: [phone]

## 2017-01-08 LAB — OLIGOCLONAL BANDS, CSF + SERM

## 2017-01-08 LAB — BASIC METABOLIC PANEL
Anion gap: 6 (ref 5–15)
BUN: 21 mg/dL — AB (ref 6–20)
CHLORIDE: 106 mmol/L (ref 101–111)
CO2: 24 mmol/L (ref 22–32)
CREATININE: 1.47 mg/dL — AB (ref 0.44–1.00)
Calcium: 8.5 mg/dL — ABNORMAL LOW (ref 8.9–10.3)
GFR calc Af Amer: 47 mL/min — ABNORMAL LOW (ref >60)
GFR calc non Af Amer: 41 mL/min — ABNORMAL LOW (ref >60)
GLUCOSE: 227 mg/dL — AB (ref 65–99)
POTASSIUM: 4.2 mmol/L (ref 3.5–5.1)
Sodium: 136 mmol/L (ref 135–145)

## 2017-01-08 LAB — POCT I-STAT, CHEM 8
BUN: 25 mg/dL — AB (ref 6–20)
BUN: 22 mg/dL — AB (ref 6–20)
CALCIUM ION: 1.24 mmol/L (ref 1.15–1.40)
CALCIUM ION: 1.22 mmol/L (ref 1.15–1.40)
CHLORIDE: 104 mmol/L (ref 101–111)
CHLORIDE: 103 mmol/L (ref 101–111)
CREATININE: 1.5 mg/dL — AB (ref 0.44–1.00)
CREATININE: 1.5 mg/dL — AB (ref 0.44–1.00)
GLUCOSE: 242 mg/dL — AB (ref 65–99)
GLUCOSE: 222 mg/dL — AB (ref 65–99)
HCT: 28 % — ABNORMAL LOW (ref 36.0–46.0)
HCT: 28 % — ABNORMAL LOW (ref 36.0–46.0)
Hemoglobin: 9.5 g/dL — ABNORMAL LOW (ref 12.0–15.0)
Hemoglobin: 9.5 g/dL — ABNORMAL LOW (ref 12.0–15.0)
Potassium: 4.5 mmol/L (ref 3.5–5.1)
Potassium: 4.2 mmol/L (ref 3.5–5.1)
SODIUM: 140 mmol/L (ref 135–145)
SODIUM: 138 mmol/L (ref 135–145)
TCO2: 24 mmol/L (ref 22–32)
TCO2: 23 mmol/L (ref 22–32)

## 2017-01-08 LAB — CBC
HCT: 29.3 % — ABNORMAL LOW (ref 36.0–46.0)
HEMOGLOBIN: 9.5 g/dL — AB (ref 12.0–15.0)
MCH: 29.1 pg (ref 26.0–34.0)
MCHC: 32.4 g/dL (ref 30.0–36.0)
MCV: 89.9 fL (ref 78.0–100.0)
Platelets: 180 10*3/uL (ref 150–400)
RBC: 3.26 MIL/uL — ABNORMAL LOW (ref 3.87–5.11)
RDW: 14.8 % (ref 11.5–15.5)
WBC: 8.5 10*3/uL (ref 4.0–10.5)

## 2017-01-08 LAB — GLUCOSE, CAPILLARY
GLUCOSE-CAPILLARY: 169 mg/dL — AB (ref 65–99)
GLUCOSE-CAPILLARY: 195 mg/dL — AB (ref 65–99)
Glucose-Capillary: 195 mg/dL — ABNORMAL HIGH (ref 65–99)
Glucose-Capillary: 211 mg/dL — ABNORMAL HIGH (ref 65–99)

## 2017-01-08 MED ORDER — GABAPENTIN 300 MG PO CAPS
600.0000 mg | ORAL_CAPSULE | Freq: Two times a day (BID) | ORAL | Status: DC
  Administered 2017-01-08: 600 mg via ORAL
  Filled 2017-01-08: qty 2

## 2017-01-08 MED ORDER — PREGABALIN 50 MG PO CAPS
50.0000 mg | ORAL_CAPSULE | Freq: Two times a day (BID) | ORAL | Status: DC
  Administered 2017-01-08 – 2017-01-09 (×3): 50 mg via ORAL
  Filled 2017-01-08 (×3): qty 1

## 2017-01-08 MED ORDER — HEPARIN SODIUM (PORCINE) 1000 UNIT/ML IJ SOLN: 1000.0000 [IU] | Freq: Once | INTRAMUSCULAR | Status: DC

## 2017-01-08 MED ORDER — ACD FORMULA A 0.73-2.45-2.2 GM/100ML VI SOLN
Status: AC
  Administered 2017-01-08: 500 mL via INTRAVENOUS
  Filled 2017-01-08: qty 500

## 2017-01-08 MED ORDER — ACETAMINOPHEN 325 MG PO TABS: 650.0000 mg | ORAL_TABLET | ORAL | Status: DC | PRN

## 2017-01-08 MED ORDER — CALCIUM CARBONATE ANTACID 500 MG PO CHEW
CHEWABLE_TABLET | ORAL | Status: AC
  Administered 2017-01-08: 400 mg via ORAL
  Filled 2017-01-08: qty 4

## 2017-01-08 MED ORDER — GABAPENTIN 600 MG PO TABS: 300.0000 mg | ORAL_TABLET | Freq: Every day | ORAL | Status: DC

## 2017-01-08 MED ORDER — CALCIUM CARBONATE ANTACID 500 MG PO CHEW
2.0000 | CHEWABLE_TABLET | ORAL | Status: AC
  Administered 2017-01-08 (×2): 400 mg via ORAL

## 2017-01-08 MED ORDER — TRAMADOL HCL 50 MG PO TABS
50.0000 mg | ORAL_TABLET | Freq: Once | ORAL | Status: AC
  Administered 2017-01-08: 50 mg via ORAL
  Filled 2017-01-08: qty 1

## 2017-01-08 MED ORDER — SODIUM CHLORIDE 0.9 % IV SOLN
2.0000 g | Freq: Once | INTRAVENOUS | Status: AC
  Administered 2017-01-08: 2 g via INTRAVENOUS
  Filled 2017-01-08: qty 20

## 2017-01-08 MED ORDER — SODIUM CHLORIDE 0.9 % IV SOLN
INTRAVENOUS | Status: AC
  Administered 2017-01-08 (×3): via INTRAVENOUS_CENTRAL
  Filled 2017-01-08 (×3): qty 200

## 2017-01-08 MED ORDER — DIPHENHYDRAMINE HCL 25 MG PO CAPS: 25.0000 mg | ORAL_CAPSULE | Freq: Four times a day (QID) | ORAL | Status: DC | PRN

## 2017-01-08 MED ORDER — ACD FORMULA A 0.73-2.45-2.2 GM/100ML VI SOLN
500.0000 mL | Status: DC
  Administered 2017-01-08: 500 mL via INTRAVENOUS

## 2017-01-08 NOTE — Progress Notes (Signed)
PT Cancellation Note  Patient Details Name: Debra [name] MRN: [id number] DOB: 1966/01/10   Cancelled Treatment:    Reason Eval/Treat Not Completed: Patient at procedure or test/unavailable. Pt currently in HD. Will need to reschedule for tomorrow.  Kallie Locks, PTA Pager [phone] Acute Rehab   Sheral Apley 01/08/2017, 10:58 AM

## 2017-01-08 NOTE — Progress Notes (Signed)
Subjective: She has had significant improvement in her vision, continues to have some difficulty with leg pain.  Exam: Vitals:   01/08/17 1403 01/08/17 1720  BP: 127/61 (!) 131/53  Pulse: 84 80  Resp: 16 16  Temp: 98.7 F (37.1 C) 98.8 F (37.1 C)  SpO2: 95% 96%   Gen: In bed, NAD Resp: non-labored breathing, no acute distress Abd: soft, nt  Neuro: MS: Awake, alert, interactive and appropriate CN: Left ptosis, visual fields full, very mildly asymmetric pupils. Motor: She has 4-/5 strength in bilateral lower extremities, 4+/5 strength bilateral upper extremities. Sensory: Intact light touch   Impression: 52 year old female with recurrent MS flare.  Given the poor response to steroids, we are pursuing plasma exchange and she is having good response.  Recommendations: 1) continue plasma exchange for a total of 5 treatments. 2) she will need outpatient follow-up following discharge.  Ritta Slot, MD [facility] [phone]  If 7pm- 7am, please page neurology on call as listed in AMION.

## 2017-01-08 NOTE — Progress Notes (Signed)
Inpatient Diabetes Program Recommendations  AACE/ADA: New Consensus Statement on Inpatient Glycemic Control (2015)  Target Ranges:  Prepandial:   less than 140 mg/dL      Peak postprandial:   less than 180 mg/dL (1-2 hours)      Critically ill patients:  140 - 180 mg/dL   Lab Results  Component Value Date   GLUCAP 169 (H) 01/08/2017   HGBA1C 11.4 (H) 12/23/2016    Review of Glycemic Control  Results for Debra [name], Debra [name] (MRN [id number]) as of 01/08/2017 11:36  Ref. Range 01/06/2017 11:38 01/06/2017 16:52 01/06/2017 21:30 01/07/2017 06:21 01/07/2017 11:48 01/07/2017 16:37  Glucose-Capillary Latest Ref Range: 65 - 99 mg/dL 842 (H) 103 (H) 128 (H) 174 (H) 177 (H) 196 (H)    Diabetes history: Type 1 Outpatient Diabetes medications: Novolog 0-15 units tid, Novolog 8 units tid, Lantus 15 units bid   Current orders for Inpatient glycemic control: Lantus 20 units bid, Novolog 0-5 units qhs, Novolog 10 units tid   Inpatient Diabetes Program Recommendations: Please consider increasing Lantus to 22 units BID.  Please consider ordering Novolog 0-9 units TID with meals and Novolog 0-5 units QHS(in addition to Novolog 10 units TID meal coverage).  Susette Racer, RN, BA, MHA, CDE Diabetes Coordinator Inpatient Diabetes Program  [phone] (Team Pager) [phone] [facility] Office) 01/08/2017 11:59 AM

## 2017-01-08 NOTE — Progress Notes (Signed)
   Subjective: No acute events overnight. She reports her vision has improved and feels it is about 90% back to her baseline, LE strength remains stable. Pt notes increased bilateral leg pain, not improved with tylenol, which interfered with sleep. Described as burning and aching. Voiding trial without foley attempted yesterday, reports retention continued and foley was replaced.   Objective:  Vital signs in last 24 hours: Vitals:   01/07/17 0605 01/07/17 1019 01/07/17 2222 01/08/17 0522  BP: (!) 153/72 (!) 149/67 139/64 (!) 178/79  Pulse: 81 84 79 79  Resp:  18 18 18   Temp: 98.5 F (36.9 C) 98.4 F (36.9 C) 98.9 F (37.2 C) 98.2 F (36.8 C)  TempSrc: Oral Oral Oral Oral  SpO2: 98% 98% 94% 100%  Weight:      Height:       General: Undergoing plasma exchange, no acute distress CV: Systolic murmur, normal rate and rhythm Resp: Lungs clear, normal work of breathing, no distress  Abd: Soft, +BS, non-tender Extr: No swelling Neuro: Alert and oriented x3, stable +3/5 LE strength with L>R, 5/5 UEs, reflexes remain diminished Skin: Warm, dry. R IJ central line in place with clean dressing   Assessment/Plan:  Multiple Sclerosis, potential NMO  Patient has history of multiple sclerosis with lower extremity weakness and bladder/bowel incontinence which worsened, also experienced new vision changes.  MRI showed new demyelinating lesions.  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  She received IV steroids x2 doses and plasmapheresis has been initiated, remaining sessions scheduled for 11/12, 11/14. LP performed showed increased protein, NMO Ab negative. Oligoclonal bands pending. Her vision has improved with plasmapheresis, mild improvement with LE weakness. Neurology consulted and following.  --Plasmapheresis per Neurology --F/u oligoclonal bands  --Neuro checks   Bilateral Leg Pain Pt reports bilateral burning and aching leg pain, likely a result of her multiple sclerosis and  ongoing evolution of her sx. She is on both cymbalta and gabapentin which are ideal for neuropathic type pain, however, her gabapentin dose was decreased on admission due to AKI which has improved. 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  She was expressing suicidal ideation on admission, no significant change in mood.  She was previously on Cymbalta 60 mg and Risperdal 1 mg based on psychiatric evaluation at outside hospital.  Psych consulted here, appreciate updated recommendations. 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 Foley placed and pt continued to have retention during initial attempt at removal.  --Cont foley   Dispo: Anticipated discharge in approximately 4-5 day(s).   [name], MD 01/08/2017, 7:10 AM Pager: [phone]

## 2017-01-09 LAB — GLUCOSE, CAPILLARY
GLUCOSE-CAPILLARY: 420 mg/dL — AB (ref 65–99)
GLUCOSE-CAPILLARY: 104 mg/dL — AB (ref 65–99)
Glucose-Capillary: 120 mg/dL — ABNORMAL HIGH (ref 65–99)
Glucose-Capillary: 124 mg/dL — ABNORMAL HIGH (ref 65–99)
Glucose-Capillary: 166 mg/dL — ABNORMAL HIGH (ref 65–99)
Glucose-Capillary: 357 mg/dL — ABNORMAL HIGH (ref 65–99)

## 2017-01-09 MED ORDER — INSULIN GLARGINE 100 UNIT/ML ~~LOC~~ SOLN
25.0000 [IU] | Freq: Every day | SUBCUTANEOUS | Status: DC
  Administered 2017-01-09: 25 [IU] via SUBCUTANEOUS
  Filled 2017-01-09 (×2): qty 0.25

## 2017-01-09 MED ORDER — INSULIN ASPART 100 UNIT/ML ~~LOC~~ SOLN
0.0000 [IU] | Freq: Three times a day (TID) | SUBCUTANEOUS | Status: DC
  Administered 2017-01-09: 1 [IU] via SUBCUTANEOUS
  Administered 2017-01-09: 2 [IU] via SUBCUTANEOUS
  Administered 2017-01-10: 7 [IU] via SUBCUTANEOUS
  Administered 2017-01-10: 3 [IU] via SUBCUTANEOUS
  Administered 2017-01-11 (×2): 7 [IU] via SUBCUTANEOUS
  Administered 2017-01-12: 5 [IU] via SUBCUTANEOUS
  Administered 2017-01-13: 2 [IU] via SUBCUTANEOUS
  Administered 2017-01-13: 1 [IU] via SUBCUTANEOUS
  Administered 2017-01-13: 3 [IU] via SUBCUTANEOUS
  Administered 2017-01-14: 1 [IU] via SUBCUTANEOUS
  Administered 2017-01-14: 5 [IU] via SUBCUTANEOUS
  Administered 2017-01-14: 7 [IU] via SUBCUTANEOUS
  Administered 2017-01-15: 5 [IU] via SUBCUTANEOUS
  Administered 2017-01-15: 1 [IU] via SUBCUTANEOUS
  Administered 2017-01-15: 9 [IU] via SUBCUTANEOUS
  Administered 2017-01-16: 2 [IU] via SUBCUTANEOUS
  Administered 2017-01-16: 7 [IU] via SUBCUTANEOUS
  Administered 2017-01-16: 5 [IU] via SUBCUTANEOUS
  Administered 2017-01-17: 2 [IU] via SUBCUTANEOUS
  Administered 2017-01-17: 1 [IU] via SUBCUTANEOUS
  Administered 2017-01-18: 2 [IU] via SUBCUTANEOUS
  Administered 2017-01-18: 5 [IU] via SUBCUTANEOUS
  Administered 2017-01-19: 2 [IU] via SUBCUTANEOUS
  Administered 2017-01-20 (×2): 3 [IU] via SUBCUTANEOUS
  Administered 2017-01-20 – 2017-01-21 (×2): 2 [IU] via SUBCUTANEOUS
  Administered 2017-01-22: 9 [IU] via SUBCUTANEOUS
  Administered 2017-01-22: 5 [IU] via SUBCUTANEOUS
  Administered 2017-01-23: 1 [IU] via SUBCUTANEOUS
  Administered 2017-01-24 – 2017-01-25 (×6): 3 [IU] via SUBCUTANEOUS
  Administered 2017-01-26 (×3): 2 [IU] via SUBCUTANEOUS
  Administered 2017-01-27: 5 [IU] via SUBCUTANEOUS
  Administered 2017-01-27: 2 [IU] via SUBCUTANEOUS
  Administered 2017-01-27: 3 [IU] via SUBCUTANEOUS
  Administered 2017-01-28: 7 [IU] via SUBCUTANEOUS
  Administered 2017-01-29: 2 [IU] via SUBCUTANEOUS
  Administered 2017-01-29: 7 [IU] via SUBCUTANEOUS

## 2017-01-09 MED ORDER — INSULIN GLARGINE 100 UNIT/ML ~~LOC~~ SOLN
20.0000 [IU] | Freq: Every day | SUBCUTANEOUS | Status: DC
  Administered 2017-01-10: 20 [IU] via SUBCUTANEOUS
  Filled 2017-01-09 (×2): qty 0.2

## 2017-01-09 MED ORDER — INSULIN ASPART 100 UNIT/ML ~~LOC~~ SOLN
5.0000 [IU] | Freq: Once | SUBCUTANEOUS | Status: AC
  Administered 2017-01-09: 5 [IU] via SUBCUTANEOUS

## 2017-01-09 MED ORDER — WHITE PETROLATUM EX OINT
TOPICAL_OINTMENT | CUTANEOUS | Status: AC
  Administered 2017-01-09: 13:00:00
  Filled 2017-01-09: qty 28.35

## 2017-01-09 NOTE — Progress Notes (Signed)
   Subjective: No acute events overnight. Continues to report her vision is much improved, at about 90% compared to her prior baseline.  She feels her lower extremity strength is stable but continues to complain of lower extremity burning pain, the prior gabapentin and now Lyrica have not provided relief.  She received a dose of tramadol overnight.   Objective:  Vital signs in last 24 hours: Vitals:   01/08/17 2048 01/08/17 2337 01/09/17 0626 01/09/17 0930  BP: 137/62 (!) 106/43 (!) 160/77 (!) 168/78  Pulse: 80 80 84 81  Resp: 16 16 16 17   Temp: 98.7 F (37.1 C) 98.4 F (36.9 C) 98.9 F (37.2 C) 98.6 F (37 C)  TempSrc: Oral Oral Oral Oral  SpO2: 95% 95% 95% 98%  Weight:      Height:       General: Sitting in chair, no acute distress CV: Systolic murmur, normal rate and rhythm Resp: Lungs clear, normal work of breathing, no distress  Abd: Soft, +BS, non-tender Extr: No swelling Neuro: Alert and oriented x3, stable 4/5 LE strength with R>L, 5/5 UEs, Skin: Warm, dry. R IJ central line in place with dressing in place   Assessment/Plan:  Multiple Sclerosis, potential NMO  Patient has history of multiple sclerosis with lower extremity weakness and bladder/bowel incontinence which worsened, also experienced new vision changes.  MRI showed new demyelinating lesions.  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  She received IV steroids x2 doses and plasmapheresis has been initiated, remaining sessions scheduled for 11/12, 11/14. LP performed showed increased protein, NMO Ab negative, oligoclonal bands negative. Her vision has improved with plasmapheresis, mild improvement with LE weakness. Neurology consulted and following.  --Plasmapheresis per Neurology  --Neuro checks   Bilateral Leg Pain Pt reports bilateral burning and aching leg pain, likely a result of her multiple sclerosis and diabetes. She is on both cymbalta, gabapentin was transitioned to lyrica, which are ideal  for neuropathic type pain. Will continue tylenol as well.  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  She was expressing suicidal ideation on admission, no significant change in mood.  She was previously on Cymbalta 60 mg and Risperdal 1 mg based on psychiatric evaluation at outside hospital.  Psych consulted here, appreciate updated recommendations. SW working to find psychiatric placement following medical management.  --Continue Cymbalta 60 mg, Remeron 15 mg qhs  --Inpatient psych admission following medical stabilization  H/o Diabetes Earlier in admission patient increased blood glucose levels with recent steroid dosing and inpatient insulin regimen adjusted with better control.  Overnight, patient had recurrent elevated blood glucose. 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 Foley placed and pt continued to have retention during initial attempt at removal. She may need to have in/out cath or indwelling catheter with urology f/u as an outpatient --Cont foley   Dispo: Anticipated discharge in approximately 3-4 day(s).   [name], MD 01/09/2017, 11:49 AM Pager: [phone]

## 2017-01-09 NOTE — Progress Notes (Signed)
Occupational Therapy Treatment Patient Details Name: Debra [name] MRN: [id number] DOB: 29-Mar-1965 Today's Date: 01/09/2017    History of present illness Pt is a 52 y/o female admitted to [facility] secondary to MS exacerbation and major depressive disorder. Pt transferred to [facility] for insertion of R IJ catheter and plasmophoresis treatments. Pt also very depressed about recent decline in function and psychiatry following. Pt on day 4/5 of PLEX. PMH includes MS, HTN, DM with neuropathy, depression, heart murmur, and restless leg syndrome.    OT comments  Pt progressing towards established OT goals. Pt highly motivated to participate in therapy. Pt performing grooming at sink with Min A for standing balance during tasks requiring bilateral coordination. Pt donning/doffing socks with VCs for compensatory techniques and required increased time and effort. Continue to recommend dc to post-acute rehab to optimize safety and independence with ADLs and functional mobility before dc home. Will continue to follow acutely to facilitate safe dc.   Follow Up Recommendations  CIR;Supervision/Assistance - 24 hour    Equipment Recommendations  3 in 1 bedside commode    Recommendations for Other Services Rehab consult    Precautions / Restrictions Precautions Precautions: Fall Precaution Comments: suicide precautions  Restrictions Weight Bearing Restrictions: No       Mobility Bed Mobility               General bed mobility comments: pt OOB in chair upon my arrival  Transfers Overall transfer level: Needs assistance Equipment used: Rolling walker (2 wheeled) Transfers: Sit to/from Stand Sit to Stand: Min assist         General transfer comment: Pt able to power into standing. Min A for stability in standing and for controlled descent.     Balance Overall balance assessment: Needs assistance Sitting-balance support: Feet supported;No upper extremity supported Sitting balance-Leahy  Scale: Fair Sitting balance - Comments: Pt demsontrates sitting in chair, able to move UEs freely with feet supported.   Standing balance support: Bilateral upper extremity supported;During functional activity Standing balance-Leahy Scale: Poor Standing balance comment: Reliant on UE support through RW and sink.                           ADL either performed or assessed with clinical judgement   ADL Overall ADL's : Needs assistance/impaired     Grooming: Minimal assistance;Standing;Oral care;Applying deodorant Grooming Details (indicate cue type and reason): Pt requiring Mi nA for standing balance while at sink. Pt requiring increased physical A during application of deotorant performing bilateral task. Pt maintaining standing for ~4-5 minutes and then requested to return to recliner due to fatigue in BLEs.                Lower Body Dressing Details (indicate cue type and reason): Pt donning socks by bring ankles to knees. Applied location to feet and maintaining feet at knee. Pt requiring increased time and effort. Pt reporting "my hands aren't doing what I want."              Functional mobility during ADLs: Minimal assistance;Rolling walker(stand-pivot only) General ADL Comments: Pt demonstrating increased funcitonal performance compared to prior session. Pt able to perform grooming at sink with Min A for balance and maintained standing for ~4-5 min. Pt donning/doffing socks with educational cues to bring ankle to knee.      Vision   Visual Fields: (blurry in L superior field)   Perception     Praxis  Cognition Arousal/Alertness: Awake/alert Behavior During Therapy: WFL for tasks assessed/performed Overall Cognitive Status: Within Functional Limits for tasks assessed                                          Exercises    Shoulder Instructions       General Comments Pt motivated to return to PLOF.    Pertinent Vitals/ Pain        Pain Assessment: Faces Faces Pain Scale: Hurts a little bit Pain Location: B LE Pain Descriptors / Indicators: Aching;Operative site guarding Pain Intervention(s): Monitored during session;Repositioned  Home Living                                          Prior Functioning/Environment              Frequency  Min 3X/week        Progress Toward Goals  OT Goals(current goals can now be found in the care plan section)  Progress towards OT goals: Progressing toward goals  Acute Rehab OT Goals Patient Stated Goal: to get up and move  OT Goal Formulation: With patient Time For Goal Achievement: 01/21/17 Potential to Achieve Goals: Good ADL Goals Pt Will Perform Grooming: with modified independence;sitting Pt Will Perform Upper Body Dressing: with modified independence;sitting Pt Will Perform Lower Body Dressing: with min assist;sit to/from stand Pt Will Transfer to Toilet: with min assist;stand pivot transfer;bedside commode Pt Will Perform Toileting - Clothing Manipulation and hygiene: with min assist;sit to/from stand  Plan Discharge plan remains appropriate    Co-evaluation                 AM-PAC PT "6 Clicks" Daily Activity     Outcome Measure   Help from another person eating meals?: A Little Help from another person taking care of personal grooming?: A Little Help from another person toileting, which includes using toliet, bedpan, or urinal?: A Lot Help from another person bathing (including washing, rinsing, drying)?: A Lot Help from another person to put on and taking off regular upper body clothing?: A Little Help from another person to put on and taking off regular lower body clothing?: A Lot 6 Click Score: 15    End of Session Equipment Utilized During Treatment: Rolling walker  OT Visit Diagnosis: Other abnormalities of gait and mobility (R26.89);Muscle weakness (generalized) (M62.81);Low vision, both eyes (H54.2)   Activity  Tolerance Patient tolerated treatment well   Patient Left in chair;with call bell/phone within reach;with chair alarm set;with nursing/sitter in room   Nurse Communication Mobility status        Time: 1287-8676 OT Time Calculation (min): 24 min  Charges: OT General Charges $OT Visit: 1 Visit OT Treatments $Self Care/Home Management : 23-37 mins  Tamorah Hada MSOT, OTR/L Acute Rehab Pager: [phone] Office: [phone]   Theodoro Grist Renalda Locklin 01/09/2017, 3:45 PM

## 2017-01-09 NOTE — Progress Notes (Signed)
Subjective: Continues to feel that her vision is improving.  Still feels as though her legs are weak with slight improvement  Exam: Vitals:   01/09/17 0626 01/09/17 0930  BP: (!) 160/77 (!) 168/78  Pulse: 84 81  Resp: 16 17  Temp: 98.9 F (37.2 C) 98.6 F (37 C)  SpO2: 95% 98%    HEENT-  Normocephalic, no lesions, without obvious abnormality.  Normal external eye and conjunctiva.  Normal TM's bilaterally.  Normal auditory canals and external ears. Normal external nose, mucus membranes and septum.  Normal pharynx.    Neuro:   MS: Awake, alert, interactive and appropriate CN: Left ptosis, visual fields full, very mildly asymmetric pupils. Motor: She has 4-/5 strength in bilateral lower extremities, 4+/5 strength bilateral upper extremities. Sensory: Intact light touch    Medications:  Scheduled: . amLODipine  10 mg Oral Daily  . aspirin EC  81 mg Oral Daily  . atorvastatin  40 mg Oral Daily  . bethanechol  50 mg Oral TID  . DULoxetine  60 mg Oral Daily  . heparin subcutaneous  5,000 Units Subcutaneous Q8H  . hydrALAZINE  75 mg Oral Q8H  . insulin aspart  0-5 Units Subcutaneous QHS  . insulin aspart  10 Units Subcutaneous TID WC  . insulin glargine  20 Units Subcutaneous BID  . metoprolol succinate  75 mg Oral Daily  . mirtazapine  15 mg Oral QHS  . pantoprazole  40 mg Oral Daily  . pregabalin  50 mg Oral BID  . sodium chloride flush  3 mL Intravenous Q12H    Pertinent Labs/Diagnostics: No new labs     Felicie Morn PA-C [facility] [phone]  No change in impression and plan  Impression: 51 year old female with recurrent MS flare.  Given the poor response to steroids, we are pursuing plasma exchange and she is having good response.  Recommendations: 1) continue plasma exchange for a total of 5 treatments. 2) she will need outpatient follow-up following discharge.     01/09/2017, 10:37 AM

## 2017-01-09 NOTE — Progress Notes (Signed)
Physical Therapy Treatment Patient Details Name: Debra [name] MRN: [id number] DOB: 08-Feb-1966 Today's Date: 01/09/2017    History of Present Illness Pt is a 51 y/o female admitted to [facility] secondary to MS exacerbation and major depressive disorder. Pt transferred to [facility] for insertion of R IJ catheter and plasmophoresis treatments. Pt also very depressed about recent decline in function and psychiatry following. Pt on day 4/5 of PLEX. PMH includes MS, HTN, DM with neuropathy, depression, heart murmur, and restless leg syndrome.     PT Comments    Pt continues to demonstrate generalized muscular weakness, decreased endurance, mobility impairments, and balance impairments secondary to above. Pt is progressing towards PT goals, noted by her ability to stand and ambulate with decreased levels of assistance during today's session. Pt was very excited/motivated, stating that she was "unsure whether or not she would be able to walk again". Pt educated on strengthening of LEs and UEs while seated in chair. Pt will continue to benefit from skilled PT in order to address aforementioned impairments and increase independence.   Follow Up Recommendations  CIR;Supervision/Assistance - 24 hour     Equipment Recommendations  None recommended by PT    Recommendations for Other Services       Precautions / Restrictions Precautions Precautions: Fall Precaution Comments: suicide precautions  Restrictions Weight Bearing Restrictions: No    Mobility  Bed Mobility               General bed mobility comments: pt OOB in chair upon my arrival  Transfers Overall transfer level: Needs assistance Equipment used: Rolling walker (2 wheeled) Transfers: Sit to/from Stand Sit to Stand: Min assist;Mod assist         General transfer comment: sit-to-stand x2, first mod assist, second min assist. required cueing for hand placement and safety.  Ambulation/Gait Ambulation/Gait assistance: Min  guard(+1 physical assist, +1 chair follow) Ambulation Distance (Feet): 5 Feet(+40') Assistive device: Rolling walker (2 wheeled) Gait Pattern/deviations: Trunk flexed;Step-through pattern;Decreased stride length Gait velocity: decreased   General Gait Details: Pt requires close min guard and chair follow for safety. Reminders to let PT know when she is tired.    Stairs            Wheelchair Mobility    Modified Rankin (Stroke Patients Only)       Balance Overall balance assessment: Needs assistance Sitting-balance support: Feet supported;No upper extremity supported Sitting balance-Leahy Scale: Fair Sitting balance - Comments: Pt demsontrates sitting in chair, able to move UEs freely with feet supported.   Standing balance support: Bilateral upper extremity supported;During functional activity Standing balance-Leahy Scale: Poor Standing balance comment: Pt heavily reliant on BUE support on RW for support during standing. Wide base of support, Bil. knees locked, and trunk flexed over RW. Pt required VC to stand upright.                            Cognition Arousal/Alertness: Awake/alert Behavior During Therapy: WFL for tasks assessed/performed Overall Cognitive Status: Within Functional Limits for tasks assessed                                        Exercises Other Exercises Other Exercises: standing weight-shifting and marching x2 both LEs with heavy reliance on RW for UE support and +1 min assist    General Comments General comments (skin integrity,  edema, etc.): Pt is motivated and very excited to be up and walking today. Pt indicates that she never thought she would walk again.       Pertinent Vitals/Pain Pain Assessment: No/denies pain    Home Living                      Prior Function            PT Goals (current goals can now be found in the care plan section) Acute Rehab PT Goals Patient Stated Goal: to get up and  move  PT Goal Formulation: With patient Time For Goal Achievement: 01/23/17 Potential to Achieve Goals: Good Progress towards PT goals: Progressing toward goals    Frequency    Min 3X/week      PT Plan Current plan remains appropriate    Co-evaluation              AM-PAC PT "6 Clicks" Daily Activity  Outcome Measure  Difficulty turning over in bed (including adjusting bedclothes, sheets and blankets)?: A Little Difficulty moving from lying on back to sitting on the side of the bed? : A Little Difficulty sitting down on and standing up from a chair with arms (e.g., wheelchair, bedside commode, etc,.)?: Unable Help needed moving to and from a bed to chair (including a wheelchair)?: A Lot Help needed walking in hospital room?: A Lot Help needed climbing 3-5 steps with a railing? : Total 6 Click Score: 12    End of Session Equipment Utilized During Treatment: Gait belt Activity Tolerance: Patient tolerated treatment well;Patient limited by fatigue Patient left: in chair;with nursing/sitter in room;with call bell/phone within reach(suicide watch nurse persent) Nurse Communication: Mobility status PT Visit Diagnosis: Unsteadiness on feet (R26.81);Muscle weakness (generalized) (M62.81);Other symptoms and signs involving the nervous system (R29.898);Difficulty in walking, not elsewhere classified (R26.2)     Time: 3888-2800 PT Time Calculation (min) (ACUTE ONLY): 20 min  Charges:  $Gait Training: 8-22 mins                    G Codes:       Reina Fuse, SPT   Reina Fuse 01/09/2017, 2:57 PM

## 2017-01-09 NOTE — Progress Notes (Signed)
Patient blood sugar 420 this morning.Call placed to Dr.Nedrud.New orders received and carried out.Will continue to monitor patient.

## 2017-01-09 NOTE — Progress Notes (Signed)
Patient complaining burning achy pain both hips rates 8/10.Patient asking for pain medication stronger than tylenol.Call placed to Dr.Nedrud.New order received.Will continue to monitor patient.

## 2017-01-09 NOTE — Progress Notes (Signed)
CSW following for discharge plan. Patient from [facility], but will not be able to return if she is not independent with ADLs. Per PT note, patient is being recommended for inpatient rehabilitation; however, patient will need psychiatric stabilization first. CSW presented case to Medical Director and informed that patient will need to admit to [facility], to be able to receive psychiatric treatment in addition to medical treatment.   CSW has initiated [facility] application. CSW alerted MD. CSW will continue to follow.  Blenda Nicely, [place] Clinical Social Worker [phone]

## 2017-01-10 ENCOUNTER — Inpatient Hospital Stay (HOSPITAL_COMMUNITY): Payer: Medicaid Other

## 2017-01-10 DIAGNOSIS — Z560 Unemployment, unspecified: Secondary | ICD-10-CM

## 2017-01-10 DIAGNOSIS — R61 Generalized hyperhidrosis: Secondary | ICD-10-CM

## 2017-01-10 DIAGNOSIS — N939 Abnormal uterine and vaginal bleeding, unspecified: Secondary | ICD-10-CM

## 2017-01-10 LAB — BASIC METABOLIC PANEL
Anion gap: 10 (ref 5–15)
BUN: 27 mg/dL — AB (ref 6–20)
CALCIUM: 9.8 mg/dL (ref 8.9–10.3)
CO2: 20 mmol/L — AB (ref 22–32)
Chloride: 110 mmol/L (ref 101–111)
Creatinine, Ser: 1.92 mg/dL — ABNORMAL HIGH (ref 0.44–1.00)
GFR calc Af Amer: 34 mL/min — ABNORMAL LOW (ref >60)
GFR, EST NON AFRICAN AMERICAN: 30 mL/min — AB (ref >60)
GLUCOSE: 436 mg/dL — AB (ref 65–99)
POTASSIUM: 4.7 mmol/L (ref 3.5–5.1)
Sodium: 140 mmol/L (ref 135–145)

## 2017-01-10 LAB — CBC
HCT: 35.4 % — ABNORMAL LOW (ref 36.0–46.0)
HCT: 32.6 % — ABNORMAL LOW (ref 36.0–46.0)
HEMATOCRIT: 28.4 % — AB (ref 36.0–46.0)
HEMOGLOBIN: 10.6 g/dL — AB (ref 12.0–15.0)
Hemoglobin: 11.5 g/dL — ABNORMAL LOW (ref 12.0–15.0)
Hemoglobin: 9 g/dL — ABNORMAL LOW (ref 12.0–15.0)
MCH: 29.4 pg (ref 26.0–34.0)
MCH: 29 pg (ref 26.0–34.0)
MCH: 28.7 pg (ref 26.0–34.0)
MCHC: 32.5 g/dL (ref 30.0–36.0)
MCHC: 32.5 g/dL (ref 30.0–36.0)
MCHC: 31.7 g/dL (ref 30.0–36.0)
MCV: 90.5 fL (ref 78.0–100.0)
MCV: 89.3 fL (ref 78.0–100.0)
MCV: 90.4 fL (ref 78.0–100.0)
PLATELETS: 202 10*3/uL (ref 150–400)
Platelets: 210 10*3/uL (ref 150–400)
Platelets: 213 10*3/uL (ref 150–400)
RBC: 3.91 MIL/uL (ref 3.87–5.11)
RBC: 3.65 MIL/uL — ABNORMAL LOW (ref 3.87–5.11)
RBC: 3.14 MIL/uL — ABNORMAL LOW (ref 3.87–5.11)
RDW: 15 % (ref 11.5–15.5)
RDW: 14.7 % (ref 11.5–15.5)
RDW: 15.1 % (ref 11.5–15.5)
WBC: 20.9 10*3/uL — ABNORMAL HIGH (ref 4.0–10.5)
WBC: 19.8 10*3/uL — ABNORMAL HIGH (ref 4.0–10.5)
WBC: 15.9 10*3/uL — AB (ref 4.0–10.5)

## 2017-01-10 LAB — URINALYSIS, ROUTINE W REFLEX MICROSCOPIC
BILIRUBIN URINE: NEGATIVE
Glucose, UA: >=500 mg/dL — AB
HGB URINE DIPSTICK: NEGATIVE
KETONES UR: NEGATIVE mg/dL
Nitrite: NEGATIVE
PROTEIN: 30 mg/dL — AB
SPECIFIC GRAVITY, URINE: 1.014 (ref 1.005–1.030)
pH: 5 (ref 5.0–8.0)

## 2017-01-10 LAB — GLUCOSE, CAPILLARY
GLUCOSE-CAPILLARY: 421 mg/dL — AB (ref 65–99)
GLUCOSE-CAPILLARY: 246 mg/dL — AB (ref 65–99)
GLUCOSE-CAPILLARY: 60 mg/dL — AB (ref 65–99)
GLUCOSE-CAPILLARY: 111 mg/dL — AB (ref 65–99)
GLUCOSE-CAPILLARY: 63 mg/dL — AB (ref 65–99)
Glucose-Capillary: 106 mg/dL — ABNORMAL HIGH (ref 65–99)
Glucose-Capillary: 122 mg/dL — ABNORMAL HIGH (ref 65–99)
Glucose-Capillary: 334 mg/dL — ABNORMAL HIGH (ref 65–99)
Glucose-Capillary: 344 mg/dL — ABNORMAL HIGH (ref 65–99)
Glucose-Capillary: 52 mg/dL — ABNORMAL LOW (ref 65–99)

## 2017-01-10 LAB — LACTIC ACID, PLASMA
LACTIC ACID, VENOUS: 1.2 mmol/L (ref 0.5–1.9)
LACTIC ACID, VENOUS: 1 mmol/L (ref 0.5–1.9)

## 2017-01-10 LAB — PROTIME-INR
INR: 1.08
Prothrombin Time: 13.9 seconds (ref 11.4–15.2)

## 2017-01-10 LAB — APTT: APTT: 35 s (ref 24–36)

## 2017-01-10 MED ORDER — INSULIN ASPART 100 UNIT/ML ~~LOC~~ SOLN
5.0000 [IU] | Freq: Once | SUBCUTANEOUS | Status: AC
  Administered 2017-01-10: 5 [IU] via SUBCUTANEOUS

## 2017-01-10 MED ORDER — POLYETHYLENE GLYCOL 3350 17 G PO PACK: 17.0000 g | PACK | Freq: Every day | ORAL | Status: DC | PRN

## 2017-01-10 MED ORDER — VANCOMYCIN HCL IN DEXTROSE 1-5 GM/200ML-% IV SOLN
1000.0000 mg | INTRAVENOUS | Status: DC
  Administered 2017-01-11 – 2017-01-12 (×2): 1000 mg via INTRAVENOUS
  Filled 2017-01-10 (×3): qty 200

## 2017-01-10 MED ORDER — TRAMADOL HCL 50 MG PO TABS
50.0000 mg | ORAL_TABLET | Freq: Once | ORAL | Status: AC
Start: 2017-01-10 — End: 2017-01-10
  Administered 2017-01-10: 50 mg via ORAL
  Filled 2017-01-10: qty 1

## 2017-01-10 MED ORDER — DEXTROSE 50 % IV SOLN
INTRAVENOUS | Status: AC
  Filled 2017-01-10: qty 50

## 2017-01-10 MED ORDER — FLEET ENEMA 7-19 GM/118ML RE ENEM
1.0000 | ENEMA | Freq: Once | RECTAL | Status: DC
Start: 2017-01-10 — End: 2017-01-10
  Filled 2017-01-10: qty 1

## 2017-01-10 MED ORDER — DEXTROSE 50 % IV SOLN
INTRAVENOUS | Status: AC
Start: 2017-01-10 — End: 2017-01-10
  Administered 2017-01-10: 18:00:00
  Filled 2017-01-10: qty 50

## 2017-01-10 MED ORDER — INSULIN GLARGINE 100 UNIT/ML ~~LOC~~ SOLN
25.0000 [IU] | Freq: Every day | SUBCUTANEOUS | Status: DC
  Administered 2017-01-11 – 2017-01-15 (×4): 25 [IU] via SUBCUTANEOUS
  Filled 2017-01-10 (×5): qty 0.25

## 2017-01-10 MED ORDER — ACD FORMULA A 0.73-2.45-2.2 GM/100ML VI SOLN
500.0000 mL | Status: DC
  Filled 2017-01-10 (×2): qty 500

## 2017-01-10 MED ORDER — SODIUM CHLORIDE 0.9 % IV SOLN
2.0000 g | Freq: Once | INTRAVENOUS | Status: DC
  Filled 2017-01-10: qty 20

## 2017-01-10 MED ORDER — POLYETHYLENE GLYCOL 3350 17 G PO PACK: 17.0000 g | PACK | Freq: Every day | ORAL | Status: DC

## 2017-01-10 MED ORDER — PREGABALIN 50 MG PO CAPS
100.0000 mg | ORAL_CAPSULE | Freq: Two times a day (BID) | ORAL | Status: DC
  Administered 2017-01-10 – 2017-01-15 (×11): 100 mg via ORAL
  Filled 2017-01-10 (×11): qty 2

## 2017-01-10 MED ORDER — DEXTROSE-NACL 5-0.45 % IV SOLN
INTRAVENOUS | Status: AC
  Administered 2017-01-10: 23:00:00 via INTRAVENOUS

## 2017-01-10 MED ORDER — SODIUM CHLORIDE 0.9 % IV SOLN
INTRAVENOUS | Status: DC
  Administered 2017-01-10 – 2017-01-11 (×3): via INTRAVENOUS
  Administered 2017-01-12: 1000 mL via INTRAVENOUS

## 2017-01-10 MED ORDER — DEXTROSE 5 % IV SOLN
2.0000 g | INTRAVENOUS | Status: DC
  Administered 2017-01-10 – 2017-01-12 (×3): 2 g via INTRAVENOUS
  Filled 2017-01-10 (×4): qty 2

## 2017-01-10 MED ORDER — VANCOMYCIN HCL 10 G IV SOLR
1500.0000 mg | Freq: Once | INTRAVENOUS | Status: AC
  Administered 2017-01-10: 1500 mg via INTRAVENOUS
  Filled 2017-01-10: qty 1500

## 2017-01-10 MED ORDER — ALBUMIN HUMAN 25 % IV SOLN
INTRAVENOUS | Status: AC
  Filled 2017-01-10 (×3): qty 200

## 2017-01-10 MED ORDER — DOCUSATE SODIUM 100 MG PO CAPS
100.0000 mg | ORAL_CAPSULE | Freq: Every day | ORAL | Status: DC
  Administered 2017-01-10 – 2017-01-15 (×6): 100 mg via ORAL
  Filled 2017-01-10 (×6): qty 1

## 2017-01-10 MED ORDER — TRAMADOL HCL 50 MG PO TABS
50.0000 mg | ORAL_TABLET | Freq: Once | ORAL | Status: AC
  Administered 2017-01-11: 50 mg via ORAL
  Filled 2017-01-10: qty 1

## 2017-01-10 MED ORDER — SENNA 8.6 MG PO TABS: 1.0000 | ORAL_TABLET | Freq: Every day | ORAL | Status: DC

## 2017-01-10 MED ORDER — ACD FORMULA A 0.73-2.45-2.2 GM/100ML VI SOLN
Status: AC
Start: 2017-01-10 — End: 2017-01-11
  Filled 2017-01-10: qty 500

## 2017-01-10 MED ORDER — DEXTROSE 50 % IV SOLN
25.0000 mL | Freq: Once | INTRAVENOUS | Status: AC
  Administered 2017-01-10: 25 mL via INTRAVENOUS

## 2017-01-10 NOTE — Progress Notes (Deleted)
Patient complaining of burning achy pain in both hips down to knee's rates 7/10.Patient asking for stronger pain medication than tylenol.Call placed to Dr.Nedrud.New order received for tramadol 50 mg by mouth one time dose.Will give medication and continue to monitor patient.

## 2017-01-10 NOTE — Progress Notes (Signed)
Subjective: She has continued to improve, able to walk yesterday.  Overnight, she has had chills, and her white count has increased to 20.  There is concern for possible infection and she has been started on antibiotics by the internal medicine team.  Exam: Vitals:   01/10/17 1119 01/10/17 1329  BP: 138/66 (!) 147/69  Pulse: 74 73  Resp: 20 20  Temp: 98 F (36.7 C) 97.8 F (36.6 C)  SpO2: 99% 100%   Gen: In bed, NAD Resp: non-labored breathing, no acute distress Abd: soft, nt  Neuro: MS: Awake, alert, interactive and appropriate CN: Left ptosis, visual fields full.. Motor: She has 4-/5 strength in bilateral lower extremities, 4+/5 strength bilateral upper extremities. Sensory: Intact light touch   Impression: 51 year old female with recurrent MS flare.  She has improved significantly, and now with concern for infection I am not certain that the benefit of continuing her plasma exchange outweighs potential risks of leaving in a line that could be a source of infection.  She has already had 4 treatments and is improving, and therefore I would recommend stopping at this point.   Recommendations: 1) discontinue plasma exchange 2) antibiotics per internal medicine 3) we will continue to follow  Ritta Slot, MD [facility] [phone]  If 7pm- 7am, please page neurology on call as listed in AMION.

## 2017-01-10 NOTE — Progress Notes (Signed)
2106 Patient blood sugar 60 asymptomatic appetite poor.Does not want anything to eat right now.Orange juice given per patient request.2133 Patient blood sugar 63.Dr.Nedrud notified 1/2 amp d50 given.Patient continues to have rectal bleeding Dr.Nedrud to come evaluate patient.2201 blood sugar 111.Per new orders will monitor blood sugar every four hours.

## 2017-01-10 NOTE — Progress Notes (Signed)
PT Cancellation Note  Patient Details Name: Debra [name] MRN: [id number] DOB: [birth year]   Cancelled Treatment:    Reason Eval/Treat Not Completed: Patient not medically ready. Nurse communicates that pt has been experiencing diaphoresis and rectal bleeding since this morning. Will follow as appropriate.    Reina Fuse 01/10/2017, 2:35 PM

## 2017-01-10 NOTE — Progress Notes (Signed)
Observed blood oozing from patient's rectum twice this shift. Asked patient what she feels before the blood comes out, patient states that she felt like "passing gas". To confirm that the blood is from the rectum, patient was instructed to attempt to pass gas again and blood can be seen oozing out of rectum. Will continue to monitor

## 2017-01-10 NOTE — Progress Notes (Signed)
Went to evaluate patient bedside for hypotension to 100/50.   Upon arrival to room patient laying comfortably in no acute distress. Patient does not endorse dizziness or headache, but rather states she has pain in lower back and hips (unchanged from yesterday). Patient also endorses subjective chills. She states that she has not had an appetite all day and that she doesn't want to eat dinner. Bedside sitter reports patient had frank blood in last bowel movement. Bedside sitter also reports low blood glucose to 62.   Physical Exam: VS: Temp 98.5, HR 80, BP 103/54, O2 96% RA Gen: Patient slightly diaphoretic while laying in bed, no acute distress CV/Pulm: RRR, holosystolic murmur at left sternal border, no increased work of breathing, lungs clear to auscultation bilaterally Abdomen: Non distended, non tender to palpation  Plan: -Hold nighttime Lantus 2/2 hypoglycemia. Hold hydralazine for asymptomatic hypotension. -D5 1/2NS @ 125 ml/hr for 4 hours for hypoglycemia -Continue NS @ 100 ml/hr after above finished -Repeat CBC now and in AM to evaluate blood loss. Plan for transfusion threshold of 7, but will have initiate transfusion if patient becomes SOB, tachycardic, or increased O2 requirement.  -Tramadol 50 mg for leg/hip pain

## 2017-01-10 NOTE — Progress Notes (Signed)
Patient complaining of constipation unrelieved by prn dose milk of magnesium.Called Dr.Nedrud new order received for fleet enema one tome dose.

## 2017-01-10 NOTE — Consult Note (Signed)
[name] Psych Consult Progress Note  01/10/2017 2:56 PM Debra [name]  MRN:  [id number] Subjective:   Ms. Staff reports that she is doing terrible. She reports that everything is wrong with her. She complains of diaphoresis and vaginal bleeding. She reports feeling more depressed due to ongoing medical issues. She continues to have suicidal thoughts but reports that she is able to tell her sitter if she feels like she would act on them. She denies any problems with her medications.   Principal Problem: Major depressive disorder, single episode, severe without psychosis (Debra [name]) Diagnosis:   Patient Active Problem List   Diagnosis Date Noted  . Major depressive disorder, single episode, severe without psychosis (Debra [name]) [F32.2] 01/02/2017  . Multiple sclerosis exacerbation (Debra [name]) [G35] 01/01/2017  . Dysthymia [F34.1] 12/27/2016  . Major depressive disorder, recurrent, severe with psychotic features (Debra [name]) [F33.3]   . Multiple joint pain [M25.50]   . HTN (hypertension) [I10]   . Acute pain of left knee [M25.562]   . Neurogenic bladder [N31.9]   . Acute lower UTI [N39.0]   . Labile blood pressure [R09.89]   . Acute blood loss anemia [D62]   . Leukocytosis [D72.829]   . Labile blood glucose [R73.09]   . Hypertensive crisis [I16.9]   . Hypoglycemia [E16.2]   . Poorly controlled type 2 diabetes mellitus with peripheral neuropathy (HCC) [E11.42, E11.65]   . Uncontrolled hypertension [I10]   . Sleep disturbance [G47.9]   . Multiple sclerosis (Debra [name]) [G35] 11/22/2016  . Thoracic root lesion [G54.3] 11/22/2016  . MS (multiple sclerosis) (Debra [name]) [G35]   . Neuropathic pain [M79.2]   . Uncontrolled type 2 diabetes mellitus with peripheral neuropathy (HCC) [E11.42, E11.65]   . Urinary retention [R33.9]   . Medically noncompliant [Z91.19]   . Leg weakness, bilateral [R29.898] 11/20/2016  . Diabetic ketoacidosis without coma associated with type 1 diabetes mellitus (Debra [name]) [E10.10]   . Leg pain [M79.606]    . History of CVA (cerebrovascular accident) [Z86.73]   . 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 Benign essential HTN [I10]   . Hypertensive urgency [I16.0]   . AKI (acute kidney injury) ([place]) [N17.9]   . Fibromyalgia [M79.7] 11/07/2016  . Back pain [M54.9] 10/31/2016  . Stable angina pectoris (Debra [name]) [I20.8]   . Vitamin D deficiency [E55.9] 08/31/2014  . 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 Hyperlipidemia [E78.5] 10/06/2013  . Depression [F32.9] 10/16/2012  . Bilateral knee pain [M25.561, M25.562] 02/16/2012  . Chronic diarrhea [K52.9] 10/19/2010  . Peripheral neuropathy [G62.9] 08/19/2010  . Type 1 diabetes mellitus (Debra [name]) [E10.9] 07/06/2010  . Hypertension [I10] 07/06/2010  . Asthma [J45.909] 07/06/2010  . Migraine [G43.909] 07/06/2010   Total Time spent with patient: 15 minutes  Past Psychiatric History: Depression   Past Medical History:  Past Medical History:  Diagnosis Date  . Adenomatous colonic polyps   . Anemia    2005  . Anxiety    1990  . Arthritis   . Asthma    2000  . Cataract   . Depression 1990  . Difficult intubation    narrow airway  . Gastroesophageal Reflux Disease (GERD)   . Heart murmur    Birth  . Hyperlipidemia    2005  . Hypertension    1998  . Internal hemorrhoids 04/24/06   on colonoscopy  . Migraine   . Neuropathy of the hands & feet   . Restless Leg Syndrome   .  Right Ankle Fracture 10/06/2013  . Sleep paralysis   . Stable angina pectoris    2007: cath showing normal cors.   . Stroke 1990  . Type I Diabetes Mellitus 1988    Past Surgical History:  Procedure Laterality Date  . bilateral foot surgery    . BREAST SURGERY Left    biopsy left breast  . CARDIAC CATHETERIZATION  2007   around 2007 or 2008  . CESAREAN SECTION    . CHOLECYSTECTOMY    . COLONOSCOPY W/ BIOPSIES AND POLYPECTOMY    . ENDOMETRIAL ABLATION W/ NOVASURE  N/A 12/2009  . EYE SURGERY    . TRIGGER FINGER RELEASE     x 3   Family History:  Family History  Problem Relation Age of Onset  . Hypertension Mother   . Kidney disease Mother   . Hypertension Father   . Breast cancer Maternal Grandmother   . Prostate cancer Maternal Grandfather   . Ovarian cancer Paternal Grandmother   . Prostate cancer Paternal Grandfather   . Colon cancer Maternal Uncle        Family history of malignant neoplasm of gastrointestinal tract   Family Psychiatric  History: Denies  Social History:  Social History   Substance and Sexual Activity  Alcohol Use No  . Alcohol/week: 0.0 oz     Social History   Substance and Sexual Activity  Drug Use No   Comment: drug addict    Social History   Socioeconomic History  . Marital status: Divorced    Spouse name: None  . Number of children: 2  . Years of education: 78  . Highest education level: None  Social Needs  . Financial resource strain: None  . Food insecurity - worry: None  . Food insecurity - inability: None  . Transportation needs - medical: None  . Transportation needs - non-medical: None  Occupational History  . Occupation: Unemployed  Tobacco Use  . Smoking status: Never Smoker  . Smokeless tobacco: Never Used  Substance and Sexual Activity  . Alcohol use: No    Alcohol/week: 0.0 oz  . Drug use: No    Comment: drug addict  . Sexual activity: Yes    Birth control/protection: Other-see comments    Comment: pt states she had ablation in 2011  Other Topics Concern  . None  Social History Narrative   Lost medicaid about 2009 ish when her youngest child turned 36. Has adult children. Lives with boyfriend who financially supports her. Has attempted to get disability but has been turned down.      2-3 caffeine drinks daily     Sleep: Poor last night but has been good.   Appetite:  Poor for the past 2 days.   Current Medications: Current Facility-Administered Medications  Medication  Dose Route Frequency Provider Last Rate Last Dose  . 0.9 %  sodium chloride infusion   Intravenous Continuous Tawny Asal, MD 100 mL/hr at 01/10/17 1239    . acetaminophen (TYLENOL) tablet 650 mg  650 mg Oral Q4H PRN Patrecia Pour, NP   650 mg at 01/10/17 5009  . albumin human 50 g in sodium chloride 0.9 %   Dialysis Q1 Hr x 3 Kirkpatrick, Vida Roller, MD      . albuterol (PROVENTIL) (2.5 MG/3ML) 0.083% nebulizer solution 2.5 mg  2.5 mg Inhalation Q4H PRN Patrecia Pour, NP      . alum & mag hydroxide-simeth (MAALOX/MYLANTA) 200-200-20 MG/5ML suspension 30 mL  30 mL Oral  Q4H PRN Patrecia Pour, NP      . amLODipine (NORVASC) tablet 10 mg  10 mg Oral Daily Tawny Asal, MD   10 mg at 01/10/17 8250  . aspirin EC tablet 81 mg  81 mg Oral Daily Aldine Contes, MD   81 mg at 01/10/17 0917  . atorvastatin (LIPITOR) tablet 40 mg  40 mg Oral Daily Aldine Contes, MD   40 mg at 01/10/17 0917  . bethanechol (URECHOLINE) tablet 50 mg  50 mg Oral TID Patrecia Pour, NP   50 mg at 01/10/17 0370  . calcium gluconate 2 g in sodium chloride 0.9 % 250 mL IVPB  2 g Intravenous Once Greta Doom, MD      . ceFEPIme (MAXIPIME) 2 g in dextrose 5 % 50 mL IVPB  2 g Intravenous Q24H Aldine Contes, MD 100 mL/hr at 01/10/17 1259 2 g at 01/10/17 1259  . citrate dextrose (ACD-A anticoagulant) 0.73-2.45-2.2 GM/100ML solution           . citrate dextrose (ACD-A anticoagulant) solution 500 mL  500 mL Intravenous Continuous Greta Doom, MD      . docusate sodium (COLACE) capsule 100 mg  100 mg Oral Daily Tawny Asal, MD   100 mg at 01/10/17 0919  . DULoxetine (CYMBALTA) DR capsule 60 mg  60 mg Oral Daily Tawny Asal, MD   60 mg at 01/10/17 4888  . hydrALAZINE (APRESOLINE) tablet 75 mg  75 mg Oral Q8H Patrecia Pour, NP   75 mg at 01/10/17 9169  . insulin aspart (novoLOG) injection 0-5 Units  0-5 Units Subcutaneous QHS Tawny Asal, MD      . insulin aspart (novoLOG) injection 0-9 Units   0-9 Units Subcutaneous TID WC Tawny Asal, MD   3 Units at 01/10/17 1255  . insulin aspart (novoLOG) injection 10 Units  10 Units Subcutaneous TID WC Aldine Contes, MD   10 Units at 01/10/17 1254  . insulin glargine (LANTUS) injection 20 Units  20 Units Subcutaneous Daily Tawny Asal, MD   20 Units at 01/10/17 1057  . insulin glargine (LANTUS) injection 25 Units  25 Units Subcutaneous QHS Tawny Asal, MD   25 Units at 01/09/17 2245  . magnesium hydroxide (MILK OF MAGNESIA) suspension 30 mL  30 mL Oral Daily PRN Patrecia Pour, NP   30 mL at 01/10/17 0050  . metoprolol succinate (TOPROL-XL) 24 hr tablet 75 mg  75 mg Oral Daily Tawny Asal, MD   75 mg at 01/10/17 0918  . mirtazapine (REMERON) tablet 15 mg  15 mg Oral QHS Tawny Asal, MD   15 mg at 01/09/17 2244  . ondansetron (ZOFRAN) tablet 4 mg  4 mg Oral Q8H PRN Patrecia Pour, NP   4 mg at 01/05/17 1211  . pantoprazole (PROTONIX) EC tablet 40 mg  40 mg Oral Daily Aldine Contes, MD   40 mg at 01/10/17 0918  . polyethylene glycol (MIRALAX / GLYCOLAX) packet 17 g  17 g Oral Daily PRN Tawny Asal, MD      . pregabalin (LYRICA) capsule 100 mg  100 mg Oral BID Tawny Asal, MD   100 mg at 01/10/17 4503  . sodium chloride flush (NS) 0.9 % injection 10-40 mL  10-40 mL Intracatheter PRN Aldine Contes, MD   10 mL at 01/10/17 0816  . sodium chloride flush (NS) 0.9 % injection 3 mL  3 mL Intravenous Q12H Burgess Estelle, MD   3 mL at 01/09/17 2249  .  vancomycin (VANCOCIN) 1,500 mg in sodium chloride 0.9 % 500 mL IVPB  1,500 mg Intravenous Once Aldine Contes, MD 250 mL/hr at 01/10/17 1326 1,500 mg at 01/10/17 1326  . [START ON 01/11/2017] vancomycin (VANCOCIN) IVPB 1000 mg/200 mL premix  1,000 mg Intravenous Q24H Aldine Contes, MD      . zolpidem (AMBIEN) tablet 5 mg  5 mg Oral QHS PRN Patrecia Pour, NP        Lab Results:  Results for orders placed or performed during the [name] encounter of 01/02/17 (from the past 48  hour(s))  Glucose, capillary     Status: Abnormal   Collection Time: 01/08/17  5:18 PM  Result Value Ref Range   Glucose-Capillary 211 (H) 65 - 99 mg/dL  Glucose, capillary     Status: Abnormal   Collection Time: 01/08/17  8:45 PM  Result Value Ref Range   Glucose-Capillary 195 (H) 65 - 99 mg/dL   Comment 1 Notify RN    Comment 2 Document in Chart   Glucose, capillary     Status: Abnormal   Collection Time: 01/09/17  6:24 AM  Result Value Ref Range   Glucose-Capillary 420 (H) 65 - 99 mg/dL   Comment 1 Notify RN    Comment 2 Document in Chart   Glucose, capillary     Status: Abnormal   Collection Time: 01/09/17  8:12 AM  Result Value Ref Range   Glucose-Capillary 357 (H) 65 - 99 mg/dL  Glucose, capillary     Status: Abnormal   Collection Time: 01/09/17 11:37 AM  Result Value Ref Range   Glucose-Capillary 166 (H) 65 - 99 mg/dL  Glucose, capillary     Status: Abnormal   Collection Time: 01/09/17  4:26 PM  Result Value Ref Range   Glucose-Capillary 124 (H) 65 - 99 mg/dL  Glucose, capillary     Status: Abnormal   Collection Time: 01/09/17  9:28 PM  Result Value Ref Range   Glucose-Capillary 104 (H) 65 - 99 mg/dL   Comment 1 Notify RN    Comment 2 Document in Chart   Glucose, capillary     Status: Abnormal   Collection Time: 01/09/17 11:56 PM  Result Value Ref Range   Glucose-Capillary 120 (H) 65 - 99 mg/dL   Comment 1 Notify RN    Comment 2 Document in Chart   Glucose, capillary     Status: Abnormal   Collection Time: 01/10/17  3:05 AM  Result Value Ref Range   Glucose-Capillary 334 (H) 65 - 99 mg/dL   Comment 1 Notify RN    Comment 2 Document in Chart   Glucose, capillary     Status: Abnormal   Collection Time: 01/10/17  6:28 AM  Result Value Ref Range   Glucose-Capillary 421 (H) 65 - 99 mg/dL   Comment 1 Notify RN    Comment 2 Document in Chart   CBC     Status: Abnormal   Collection Time: 01/10/17  7:52 AM  Result Value Ref Range   WBC 20.9 (H) 4.0 - 10.5 K/uL    RBC 3.91 3.87 - 5.11 MIL/uL   Hemoglobin 11.5 (L) 12.0 - 15.0 g/dL   HCT 35.4 (L) 36.0 - 46.0 %   MCV 90.5 78.0 - 100.0 fL   MCH 29.4 26.0 - 34.0 pg   MCHC 32.5 30.0 - 36.0 g/dL   RDW 15.0 11.5 - 15.5 %   Platelets 210 150 - 400 K/uL  Basic metabolic panel  Status: Abnormal   Collection Time: 01/10/17  7:52 AM  Result Value Ref Range   Sodium 140 135 - 145 mmol/L   Potassium 4.7 3.5 - 5.1 mmol/L   Chloride 110 101 - 111 mmol/L   CO2 20 (L) 22 - 32 mmol/L   Glucose, Bld 436 (H) 65 - 99 mg/dL   BUN 27 (H) 6 - 20 mg/dL   Creatinine, Ser 1.92 (H) 0.44 - 1.00 mg/dL   Calcium 9.8 8.9 - 10.3 mg/dL   GFR calc non Af Amer 30 (L) >60 mL/min   GFR calc Af Amer 34 (L) >60 mL/min    Comment: (NOTE) The eGFR has been calculated using the CKD EPI equation. This calculation has not been validated in all clinical situations. eGFR's persistently <60 mL/min signify possible Chronic Kidney Disease.    Anion gap 10 5 - 15  Glucose, capillary     Status: Abnormal   Collection Time: 01/10/17  8:43 AM  Result Value Ref Range   Glucose-Capillary 344 (H) 65 - 99 mg/dL  CBC     Status: Abnormal   Collection Time: 01/10/17 10:47 AM  Result Value Ref Range   WBC 19.8 (H) 4.0 - 10.5 K/uL   RBC 3.65 (L) 3.87 - 5.11 MIL/uL   Hemoglobin 10.6 (L) 12.0 - 15.0 g/dL   HCT 32.6 (L) 36.0 - 46.0 %   MCV 89.3 78.0 - 100.0 fL   MCH 29.0 26.0 - 34.0 pg   MCHC 32.5 30.0 - 36.0 g/dL   RDW 14.7 11.5 - 15.5 %   Platelets 213 150 - 400 K/uL  Lactic acid, plasma     Status: None   Collection Time: 01/10/17 11:01 AM  Result Value Ref Range   Lactic Acid, Venous 1.2 0.5 - 1.9 mmol/L  Glucose, capillary     Status: Abnormal   Collection Time: 01/10/17 11:39 AM  Result Value Ref Range   Glucose-Capillary 246 (H) 65 - 99 mg/dL  Urinalysis, Routine w reflex microscopic     Status: Abnormal   Collection Time: 01/10/17  1:24 PM  Result Value Ref Range   Color, Urine YELLOW YELLOW   APPearance CLEAR CLEAR    Specific Gravity, Urine 1.014 1.005 - 1.030   pH 5.0 5.0 - 8.0   Glucose, UA >=500 (A) NEGATIVE mg/dL   Hgb urine dipstick NEGATIVE NEGATIVE   Bilirubin Urine NEGATIVE NEGATIVE   Ketones, ur NEGATIVE NEGATIVE mg/dL   Protein, ur 30 (A) NEGATIVE mg/dL   Nitrite NEGATIVE NEGATIVE   Leukocytes, UA SMALL (A) NEGATIVE   RBC / HPF 0-5 0 - 5 RBC/hpf   WBC, UA 6-30 0 - 5 WBC/hpf   Bacteria, UA RARE (A) NONE SEEN   Squamous Epithelial / LPF 0-5 (A) NONE SEEN   WBC Clumps PRESENT    Mucus PRESENT    Granular Casts, UA PRESENT     Blood Alcohol level:  Lab Results  Component Value Date   ETH <10 12/20/2016   [name]  12/09/2006    <5        LOWEST DETECTABLE LIMIT FOR SERUM ALCOHOL IS 11 mg/dL FOR MEDICAL PURPOSES ONLY    Musculoskeletal: Strength & Muscle Tone: Bilateral lower extremity weakness Gait & Station: UTA since patient is sitting in a chair. Patient leans: N/A  Psychiatric Specialty Exam: Physical Exam  Nursing note and vitals reviewed. Constitutional: She is oriented to person, place, and time. She appears well-developed and well-nourished.  HENT:  Head: Normocephalic and atraumatic.  Neck: Normal range of motion.  Respiratory: Effort normal.  Musculoskeletal: Normal range of motion.  Neurological: She is alert and oriented to person, place, and time.  Skin: No rash noted.  Psychiatric: Her behavior is normal. Judgment and thought content normal.    Review of Systems  Constitutional: Positive for diaphoresis.  Psychiatric/Behavioral: Positive for depression and suicidal ideas. Negative for hallucinations and substance abuse. The patient has insomnia. The patient is not nervous/anxious.     Blood pressure (!) 147/69, pulse 73, temperature 97.8 F (36.6 C), temperature source Oral, resp. rate 20, height _0  (1.626 m), weight 72.6 kg (160 lb 0.9 oz), SpO2 100 %.Body mass index is 27.47 kg/m.  General Appearance: Well Groomed, African American female with braided  hair, corrective lenses and a [name] gown who is sitting upright in her chair and eating lunch. NAD.   Eye Contact:  Good  Speech:  Clear and Coherent and Normal Rate  Volume:  Normal  Mood:  Depressed  Affect:  Congruent and Depressed  Thought Process:  Goal Directed and Linear  Orientation:  Full (Time, Place, and Person)  Thought Content:  Logical  Suicidal Thoughts:  Yes.  without intent/plan  Homicidal Thoughts:  No  Memory:  Immediate;   Good Recent;   Good Remote;   Good  Judgement:  Fair  Insight:  Fair  Psychomotor Activity:  Normal  Concentration:  Concentration: Good and Attention Span: Good  Recall:  Good  Fund of Knowledge:  Good  Language:  Good  Akathisia:  No  Handed:  Right  AIMS (if indicated):   N/A  Assets:  Communication Skills Desire for Improvement Housing  ADL's:  Intact  Cognition:  WNL  Sleep:   Poor overnight but recently good.    Assessment:  Debra [name] is a 51 y.o. female who was admitted withacute MS from [facility] for plasma exchange treatment due to worsening vision that was not improved with Solu-Medrol.She has had improvement of MS symptoms but continues to endorse SI with depressive symptoms that have not improved since hospitalization or following treatment at [name] prior to transfer to treat acute MS at [facility]. She continues to warrant inpatient psychiatric hospitalization once she is medically cleared due to high risk of harm to self.      Treatment Plan Summary: -Continue bedside sitter due to ongoing SI and risk of harm to self.  -Continue Cymbalta 60 mg daily for depression.  -Increase Remeron 15 mg qhs to 30 mg qhs for sleep, anxiety and depression.  -Patient continues to require inpatient psychiatric hospitalization following medical clearance given continued SI.    Faythe Dingwall, DO 01/10/2017, 2:56 PM

## 2017-01-10 NOTE — Progress Notes (Signed)
0315 In to administer fleet enema patient lying on left side.Can visibly see dilated anus and hard stool trying to come out.Patient is impacted.Call placed to Dr.Nedrud for order for disimpaction.0320 Patient disimpacted by charge nurse Phil of moderate amount of hard stool then patient started having bowel movement on her own.Enema not given at this time Dr.Nedrud aware.Patient also became diaphoretic prior to disimpaction gown and bed linen wet.Checked blood sugar 334.Dr.Nedrud notified no new orders to give additional insulin at this time.

## 2017-01-10 NOTE — Progress Notes (Signed)
Inpatient Diabetes Program Recommendations  AACE/ADA: New Consensus Statement on Inpatient Glycemic Control (2015)  Target Ranges:  Prepandial:   less than 140 mg/dL      Peak postprandial:   less than 180 mg/dL (1-2 hours)      Critically ill patients:  140 - 180 mg/dL   Lab Results  Component Value Date   GLUCAP 246 (H) 01/10/2017   HGBA1C 11.4 (H) 12/23/2016    Review of Glycemic ControlResults for JALAIYA, ELLIS (MRN [id number]) as of 01/10/2017 14:14  Ref. Range 01/09/2017 21:28 01/09/2017 23:56 01/10/2017 03:05 01/10/2017 06:28 01/10/2017 08:43 01/10/2017 11:39  Glucose-Capillary Latest Ref Range: 65 - 99 mg/dL 801 (H) 655 (H) 374 (H) 421 (H) 344 (H) 246 (H)   Diabetes history: Type 1 Outpatient Diabetes medications: Novolog 0-15 units tid, Novolog 8 units tid, Lantus 15 units bid  Current orders for Inpatient glycemic control: Lantus 20 units q AM and Lantus 25 units q PM, Novolog sensitive tid with meals and Novolog 0-5 units qhs, Novolog 10 units tid Inpatient Diabetes Program Recommendations: May consider increasing Novolog correction to q 4 hours due to spike of blood sugars in the early AM?  Thanks, Beryl Meager, RN, BC-ADM Inpatient Diabetes Coordinator Pager [phone] (8a-5p)

## 2017-01-10 NOTE — Progress Notes (Signed)
Pharmacy Antibiotic Note  Debra [name] is a 51 y.o. female admitted on 01/02/2017 with potential line infection Pharmacy has been consulted for vancomycin and cefepime dosing. This morning her WBC jumped from 8.5 to 20.9, lactic acid is 1.2, she is afebrile, however she has been very diaphoretic. Cultures have been drawn.  Plan: Vancomycin 1500 mg IV x1 Vancomycin 1000 mg IV Q24 hours Cefepime 2g IV Q24 hours Follow cultures, clinical status, renal function Collect vancomycin troughs as clinically indicated  Dosing Weight: 72.6 kg  Temp (24hrs), Avg:98.1 F (36.7 C), Min:97.5 F (36.4 C), Max:98.8 F (37.1 C)  Recent Labs  Lab 01/04/17 0807  01/06/17 0928 01/06/17 0930 01/08/17 0948 01/08/17 1005 01/10/17 0752 01/10/17 1047 01/10/17 1101  WBC 8.1  --   --  9.0  --  8.5 20.9* 19.8*  --   CREATININE 1.72*   < > 1.50* 1.59* 1.50* 1.47* 1.92*  --   --   LATICACIDVEN  --   --   --   --   --   --   --   --  1.2   < > = values in this interval not displayed.    Estimated Creatinine Clearance: 34.6 mL/min (A) (by C-G formula based on SCr of 1.92 mg/dL (H)).    Allergies  Allergen Reactions  . Penicillins Anaphylaxis, Nausea And Vomiting and Rash    Has patient had a PCN reaction causing immediate rash, facial/tongue/throat swelling, SOB or lightheadedness with hypotension: Yes Has patient had a PCN reaction causing severe rash involving mucus membranes or skin necrosis: Yes Has patient had a PCN reaction that required hospitalization No Has patient had a PCN reaction occurring within the last 10 years: Yes If all of the above answers are "NO", then may proceed with Cephalosporin use.   . Pollen Extract     Seasonal Allergies  . Tape Rash    Thank you for allowing pharmacy to be a part of this patient's care.   Nolen Mu PharmD PGY1 Pharmacy Practice Resident 01/10/2017 12:07 PM Pager: [phone]

## 2017-01-10 NOTE — Progress Notes (Signed)
Patient's blood sugar is 52 (non-symptomatic)She is alert and oriented X4. Dextrose administered per hypoglycemic protocol(Half given), Patient is encouraged to eat. She ate 10% of her meal. Re-assessed CBG. It is now 106mg /dl Patient Will continue to monitor

## 2017-01-10 NOTE — Progress Notes (Signed)
Patient complaining of burning achy pain in both hips down to knee's rates 7/10.Patient asking for stronger pain medication other than tylenol.Call placed to Dr.Nedrud.New order received for tramadol 50 mg by mouth one time dose.Will give medication and continue to monitor.

## 2017-01-10 NOTE — Progress Notes (Signed)
Patient blood sugar 421 this morning.Patient also having having some bleeding from rectum since disimpaction this morning.Patient has been diaphoretic since disimpaction.Call placed to Dr.Nedrud.New orders received.Will continue to monitor patient.

## 2017-01-10 NOTE — Progress Notes (Signed)
Subjective: Overnight, the pt continued to complain of LE pain not sufficiently relieved with lyrica. She also complained of worsening constipation which led to her receiving manual disimpaction which provided relief, had some oozing of blood following.   Following the disimpaction and this morning during evaluation, the pt was diaphoretic, complaining of chills, as well as continued LE pain. Repeat vitals performed: afebrile, HR 75, normotensive, good saturation.   Regarding her neuro status, pt reports that she walked with a walker for the first time yesterday. Vision remains at 90% of her prior baseline.   Objective:  Vital signs in last 24 hours: Vitals:   01/09/17 1730 01/09/17 2132 01/10/17 0018 01/10/17 0544  BP: 137/63 (!) 140/59 140/63 (!) 124/54  Pulse: 80 82 84 (!) 54  Resp: 18 18 20 20   Temp: 98.8 F (37.1 C)  97.7 F (36.5 C) 98.5 F (36.9 C)  TempSrc: Oral Oral Oral Oral  SpO2: 100% 98% 100% 97%  Weight:      Height:       General: Laying in bed, ill appearing and diaphoretic  CV: Systolic murmur, normal rate and rhythm Resp: Lungs clear, normal work of breathing Abd: Soft, +BS, non-tender Extr: No swelling Neuro: Alert and oriented x3, 4/5 LE strength, 5/5 UEs, Skin: Warm, dry. R IJ central line in place with dressing in place  Assessment/Plan:  Leukocytosis, Diaphoresis/Chills Patient is now ill-appearing with diaphoresis, chills.  Currently afebrile, white count this morning has increased to 20.9.  No obvious symptoms of infection as patient only complains of persistent lower extremity pain.  She does have a central line, as well as Foley catheter, concerning for a potential line associated infection. Would not expect degree of WBC elevation with hemoconcentration. Will pursue infectious workup and empiric antibiotics given this acute change. Will discuss plasmapheresis and line status with Neurology.  --Rpt CBC  --Blood Cx's x 2, LA, U/A + Cx, CXR --Vanc,  Cefepime per pharm following Cx --IVF     Multiple Sclerosis, potential NMO  Patient has history of multiple sclerosis with lower extremity weakness and bladder/bowel incontinence which worsened, also experienced new vision changes.  MRI showed new demyelinating lesions.  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  She received IV steroids x2 doses and plasmapheresis has been initiated, remaining sessions scheduled for 11/12, 11/14. LP performed showed increased protein, NMO Ab negative, oligoclonal bands negative. Her vision has improved with plasmapheresis, mild improvement with LE weakness. Neurology consulted and following.  --Plasmapheresis per Neurology   --Neuro checks   Bilateral Leg Pain Pt reports bilateral burning and aching leg pain, likely a result of her multiple sclerosis and diabetes. She is on both cymbalta, gabapentin was transitioned to lyrica, which are ideal for neuropathic type pain. Will continue tylenol as well.  --Increase lyrica  --Continue to monitor   Constipation Pt now experiencing constipation which required manual disimpaction, some oozing of blood following. Will continue to monitor and add bowel regimen to avoid recurrence.   Acute on Chronic Kidney Injury (CKD III)  Pt presented with Elevated creatinine greater than 2 compared to baseline around 1.5. This improved early in admission and was back to her baseline at 1.47. This morning increased back to 1.9.  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  She was expressing suicidal ideation on admission, no significant change in mood.  She was previously on  Cymbalta 60 mg and Risperdal 1 mg based on psychiatric evaluation at outside hospital.  Psych consulted here, appreciate updated recommendations. SW working to find psychiatric placement following medical management.    --Continue Cymbalta 60 mg, Remeron 15 mg qhs  --Inpatient psych admission following medical stabilization  H/o Diabetes Earlier in admission patient increased blood glucose levels with recent steroid dosing and inpatient insulin regimen adjusted with better control. She has had good glycemic control during the days, however, overnight, patient had recurrent elevated blood glucose. Pt may be snacking or drinking fluids with glucose overnight..   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 Foley placed and pt continued to have retention during initial attempt at removal. She may need to have in/out cath or indwelling catheter with urology f/u as an outpatient --Cont foley, remove if U/A and cx suggest urinary source    Dispo: Anticipated discharge in approximately 3-4 day(s).   [name], MD 01/10/2017, 7:11 AM Pager: [phone]

## 2017-01-11 DIAGNOSIS — R339 Retention of urine, unspecified: Secondary | ICD-10-CM

## 2017-01-11 DIAGNOSIS — N183 Chronic kidney disease, stage 3 (moderate): Secondary | ICD-10-CM

## 2017-01-11 DIAGNOSIS — M79604 Pain in right leg: Secondary | ICD-10-CM

## 2017-01-11 DIAGNOSIS — M79605 Pain in left leg: Secondary | ICD-10-CM

## 2017-01-11 DIAGNOSIS — K5900 Constipation, unspecified: Secondary | ICD-10-CM

## 2017-01-11 DIAGNOSIS — E1122 Type 2 diabetes mellitus with diabetic chronic kidney disease: Secondary | ICD-10-CM

## 2017-01-11 DIAGNOSIS — D72829 Elevated white blood cell count, unspecified: Secondary | ICD-10-CM

## 2017-01-11 DIAGNOSIS — R6883 Chills (without fever): Secondary | ICD-10-CM

## 2017-01-11 DIAGNOSIS — Z960 Presence of urogenital implants: Secondary | ICD-10-CM

## 2017-01-11 LAB — GLUCOSE, CAPILLARY
GLUCOSE-CAPILLARY: 161 mg/dL — AB (ref 65–99)
GLUCOSE-CAPILLARY: 324 mg/dL — AB (ref 65–99)
GLUCOSE-CAPILLARY: 326 mg/dL — AB (ref 65–99)
Glucose-Capillary: 100 mg/dL — ABNORMAL HIGH (ref 65–99)
Glucose-Capillary: 58 mg/dL — ABNORMAL LOW (ref 65–99)
Glucose-Capillary: 219 mg/dL — ABNORMAL HIGH (ref 65–99)

## 2017-01-11 LAB — BASIC METABOLIC PANEL
ANION GAP: 6 (ref 5–15)
BUN: 20 mg/dL (ref 6–20)
CALCIUM: 8.4 mg/dL — AB (ref 8.9–10.3)
CO2: 22 mmol/L (ref 22–32)
Chloride: 109 mmol/L (ref 101–111)
Creatinine, Ser: 1.59 mg/dL — ABNORMAL HIGH (ref 0.44–1.00)
GFR calc Af Amer: 43 mL/min — ABNORMAL LOW (ref >60)
GFR, EST NON AFRICAN AMERICAN: 37 mL/min — AB (ref >60)
GLUCOSE: 190 mg/dL — AB (ref 65–99)
Potassium: 3.9 mmol/L (ref 3.5–5.1)
Sodium: 137 mmol/L (ref 135–145)

## 2017-01-11 LAB — CBC
HEMATOCRIT: 28 % — AB (ref 36.0–46.0)
Hemoglobin: 8.9 g/dL — ABNORMAL LOW (ref 12.0–15.0)
MCH: 28.8 pg (ref 26.0–34.0)
MCHC: 31.8 g/dL (ref 30.0–36.0)
MCV: 90.6 fL (ref 78.0–100.0)
PLATELETS: 211 10*3/uL (ref 150–400)
RBC: 3.09 MIL/uL — ABNORMAL LOW (ref 3.87–5.11)
RDW: 15.2 % (ref 11.5–15.5)
WBC: 15.6 10*3/uL — AB (ref 4.0–10.5)

## 2017-01-11 LAB — URINE CULTURE: CULTURE: NO GROWTH

## 2017-01-11 MED ORDER — MIRTAZAPINE 15 MG PO TABS
30.0000 mg | ORAL_TABLET | Freq: Every day | ORAL | Status: DC
  Administered 2017-01-11 – 2017-01-23 (×13): 30 mg via ORAL
  Filled 2017-01-11 (×13): qty 2

## 2017-01-11 MED ORDER — DEXTROSE 50 % IV SOLN
INTRAVENOUS | Status: AC
  Administered 2017-01-11: 50 mL
  Filled 2017-01-11: qty 50

## 2017-01-11 NOTE — Progress Notes (Addendum)
Occupational Therapy Treatment Patient Details Name: Debra [name] MRN: [id number] DOB: 08-Jan-1966 Today's Date: 01/11/2017    History of present illness Pt is a 51 y/o female admitted to [facility] secondary to MS exacerbation and major depressive disorder. Pt transferred to [facility] for insertion of R IJ catheter and plasmophoresis treatments. Pt also very depressed about recent decline in function and psychiatry following. Pt only completed 4/5 PLEX and was discontinued (possible infection acquired). PMH includes MS, HTN, DM with neuropathy, depression, heart murmur, and restless leg syndrome.    OT comments  Pt with decline in functional status this session. Able to perform bed mobility with min guard assist but required total assist for pericare at bed level. Pt with decreased trunk control and LE weakness; able to complete trunk activities in sitting with min guard assist and partial sit to stand x10 with max assist. D/c plan remains appropriate. Will continue to follow acutely.   Follow Up Recommendations  CIR;Supervision/Assistance - 24 hour    Equipment Recommendations  3 in 1 bedside commode    Recommendations for Other Services      Precautions / Restrictions Precautions Precautions: Fall Precaution Comments: suicide precautions  Restrictions Weight Bearing Restrictions: No       Mobility Bed Mobility Overal bed mobility: Needs Assistance Bed Mobility: Supine to Sit;Sit to Supine;Rolling Rolling: Modified independent (Device/Increase time)  Supine to sit: Min guard Sit to supine: Min guard   General bed mobility comments: Min guard for safety with HOB flat and use of bed rails. Increased time required  Transfers    Balance Overall balance assessment: Needs assistance Sitting-balance support: Feet supported;No upper extremity supported Sitting balance-Leahy Scale: Fair                            ADL either performed or assessed with clinical judgement    ADL Overall ADL's : Needs assistance/impaired                             Toileting- Clothing Manipulation and Hygiene: Total assistance;Bed level Toileting - Clothing Manipulation Details (indicate cue type and reason): for peri care only             Vision       Perception     Praxis      Cognition Arousal/Alertness: Awake/alert Behavior During Therapy: WFL for tasks assessed/performed Overall Cognitive Status: Within Functional Limits for tasks assessed                                          Exercises Exercises: Other exercises Other Exercises Other Exercises: Reaching activiites x10 forward, x10 L, x10 R. Other Exercises: Partial sit to stand x10 with max assist.   Shoulder Instructions       General Comments     Pertinent Vitals/ Pain       Pain Assessment: Faces Faces Pain Scale: Hurts little more Pain Location: bil LEs Pain Descriptors / Indicators: Aching;Sore Pain Intervention(s): Monitored during session  Home Living                                          Prior Functioning/Environment  Frequency  Min 3X/week        Progress Toward Goals  OT Goals(current goals can now be found in the care plan section)  Progress towards OT goals: Not progressing toward goals - comment  Acute Rehab OT Goals Patient Stated Goal: get better OT Goal Formulation: With patient  Plan Discharge plan remains appropriate    Co-evaluation                 AM-PAC PT "6 Clicks" Daily Activity     Outcome Measure   Help from another person eating meals?: A Little Help from another person taking care of personal grooming?: A Little Help from another person toileting, which includes using toliet, bedpan, or urinal?: Total Help from another person bathing (including washing, rinsing, drying)?: A Lot Help from another person to put on and taking off regular upper body clothing?: A  Little Help from another person to put on and taking off regular lower body clothing?: A Lot 6 Click Score: 14    End of Session Equipment Utilized During Treatment: Gait belt  OT Visit Diagnosis: Other abnormalities of gait and mobility (R26.89);Muscle weakness (generalized) (M62.81);Low vision, both eyes (H54.2)   Activity Tolerance Patient tolerated treatment well   Patient Left in bed;with call bell/phone within reach;with nursing/sitter in room   Nurse Communication          Time: 6701-4103 OT Time Calculation (min): 18 min  Charges: OT General Charges $OT Visit: 1 Visit OT Treatments $Therapeutic Activity: 8-22 mins  Doristine Shehan A. Brett Albino, M.S., OTR/L Pager: [phone]   Gaye Alken 01/11/2017, 4:13 PM

## 2017-01-11 NOTE — Progress Notes (Signed)
Patient's CBG is 52 this morning. She is non-symptomatic. D50 given, encouraged intake by mouth. MD notified Will continue to monitor.

## 2017-01-11 NOTE — Progress Notes (Signed)
CSW following to facilitate discharge plan. CSW faxed over application for [facility] yesterday; called this morning to verify receipt. CSW provided verbal information over the phone to verify placement on the wait list. CSW will continue to follow.  Blenda Nicely, [place] Clinical Social Worker [phone]

## 2017-01-11 NOTE — Progress Notes (Signed)
Subjective: Pt reports overall improvement, no further chills or diaphoresis. She reports her vision and weakness are stable, still notes some oozing of blood from rectum following disimpaction yesterday morning, heparin ppx being held.    Objective:  Vital signs in last 24 hours: Vitals:   01/11/17 0047 01/11/17 0542 01/11/17 0920 01/11/17 1348  BP: 131/66 (!) 153/74 (!) 145/61 (!) 126/57  Pulse: 85 86 91 91  Resp: 18 20  20   Temp: 98.4 F (36.9 C) 98.4 F (36.9 C) 98.9 F (37.2 C) 99.7 F (37.6 C)  TempSrc: Oral Oral Oral Oral  SpO2: 100% 100% 100% 98%  Weight:  163 lb 5.8 oz (74.1 kg)    Height:       General: Laying in bed, improvement in general clinical appearance CV: Systolic murmur, normal rate and rhythm Resp: Lungs clear, normal work of breathing Abd: Soft, +BS, non-tender Extr: No swelling Neuro: Alert and oriented x3, 4/5 LE strength, 5/5 UEs Skin: Warm, dry. Interval removal of central line  Assessment/Plan:  Leukocytosis, Diaphoresis/Chills On 11/14 patient became acutely ill appearing and her white count had increased to 20.  Due to concern for possible infection, she started on broad-spectrum antibiotics after blood and urine cultures were obtained.  After discussing case with neurology her last plasmapheresis session was abandoned and her central line removed.  She has improved and her white count has trended down, currently 15.6.  Blood cultures are no growth today, urine culture negative though U/A with white blood cell clumps.  Removing Foley today.  She has remained afebrile and hemodynamically stable. --Vanc, Cefepime per pharm  --CBC --IVF     Multiple Sclerosis, potential NMO  Patient has history of multiple sclerosis with lower extremity weakness and bladder/bowel incontinence which worsened, also experienced new vision changes.  MRI showed new demyelinating lesions.  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  She received IV steroids x2  doses and plasmapheresis has been initiated, remaining sessions scheduled for 11/12, 11/14. LP performed showed increased protein, NMO Ab negative, oligoclonal bands negative. Her vision and lower extremity weakness improved with plasmapheresis sessions.  Neurology consulted and followed the course of the admission.  Will need outpatient follow-up to initiate maintenance therapy.  Bilateral Leg Pain Pt reports bilateral burning and aching leg pain, likely a result of her multiple sclerosis and diabetes. She is on both cymbalta, gabapentin was transitioned to lyrica, which are ideal for neuropathic type pain. Will continue tylenol as well.  --Cont Lyrica   Constipation Pt now experiencing constipation which required manual disimpaction, some oozing of blood following. Will continue to monitor and add bowel regimen to avoid recurrence.  Holding heparin for DVT prophylaxis and hemoglobin stable.  Acute on Chronic Kidney Injury (CKD III)  Pt presented with Elevated creatinine greater than 2 compared to baseline around 1.5. This improved early in admission and was back to her baseline at 1.47.  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  She was expressing suicidal ideation on admission, no significant change in mood.  She was previously on Cymbalta 60 mg and Risperdal 1 mg based on psychiatric evaluation at outside hospital.  Psych consulted here, appreciate updated recommendations. SW working to find psychiatric placement following medical management.  --Continue Cymbalta 60 mg, Remeron increased to  30 mg qhs  --Inpatient psych admission following medical stabilization  H/o Diabetes Earlier in admission patient increased blood glucose levels  with recent steroid dosing and inpatient insulin regimen adjusted with better control.  Since that time, she has had difficult to control blood glucose values with a range of variation. Continuing to monitor closely, holding am lantus.   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 Foley placed and pt continued to have retention during initial attempt at removal. Her neurologic sx have improved since last attempt, retry voiding trial today  --Remove foley, monitor urine output    Dispo: Anticipated discharge in approximately 2-3 day(s).   [name], MD 01/11/2017, 2:09 PM Pager: [phone]

## 2017-01-11 NOTE — Progress Notes (Signed)
CSW received request from Leonette Most at [facility] Admission/Intake who is requesting further records on patient.  Last notes received on 01/02/17.  CSW is to fax the following:  Current medical notes including neuro notes  Summary of neuro course of care (treatment paln) PT/OT Notes Most recent vitals Most recent labs Most recent [facility]  All faxed to [phone]  Carney Bern T. Kaylyn Lim, MSW, LCSWA Disposition Clinical Social Work [phone] (cell) [phone] (office)

## 2017-01-11 NOTE — Progress Notes (Signed)
Subjective: Feels better today  Exam: Vitals:   01/11/17 0542 01/11/17 0920  BP: (!) 153/74 (!) 145/61  Pulse: 86 91  Resp: 20   Temp: 98.4 F (36.9 C) 98.9 F (37.2 C)  SpO2: 100% 100%   Gen: In bed, NAD Resp: non-labored breathing, no acute distress Abd: soft, nt  Neuro: MS: Awake, alert, interactive and appropriate CN: Left ptosis, visual fields full.. Motor: She has 4-/5 strength in bilateral lower extremities, 4+/5 strength bilateral upper extremities. Sensory: Intact light touch   Impression: 51 year old female with recurrent MS flare now s/p PLEX. She received 4 treatments, but developed concern for infection and given her improvement, I favored discontinuing the plasma exchange. Treatment from this point forward will be    Recommendations: 1) antibiotics per internal medicine 2) She will need outpatient follow up with outpatient neurology.  3) She will need continued PT/OT.   Ritta Slot, MD [facility] [phone]  If 7pm- 7am, please page neurology on call as listed in AMION.

## 2017-01-11 NOTE — Progress Notes (Signed)
Physical Therapy Treatment Patient Details Name: Debra [name] MRN: [id number] DOB: 1966/02/23 Today's Date: 01/11/2017    History of Present Illness Pt is a 51 y/o female admitted to [facility] secondary to MS exacerbation and major depressive disorder. Pt transferred to [facility] for insertion of R IJ catheter and plasmophoresis treatments. Pt also very depressed about recent decline in function and psychiatry following. Pt only completed 4/5 PLEX and was discontinued (possible infection acquired). PMH includes MS, HTN, DM with neuropathy, depression, heart murmur, and restless leg syndrome.     PT Comments    Pt demonstrates decline in function since last PT session 2 days ago. Pt continues to demonstrate generalized muscular weakness, decreased endurance, impaired balance, and mobility impairments. Pt attempts to stand from EOB with max assist +2, but is unable to complete. Pt tolerated EOB trunk control exercises. Pt's BLEs and trunk show marked decrease in strength. Pt c/o dizziness and fatigue while seated at EOB. Pt amenable to and would benefit from continued skilled PT in order to improve upon above listed impairments and increase level of independence.    Follow Up Recommendations  CIR;Supervision/Assistance - 24 hour     Equipment Recommendations  None recommended by PT    Recommendations for Other Services       Precautions / Restrictions Precautions Precautions: Fall Precaution Comments: suicide precautions  Restrictions Weight Bearing Restrictions: No    Mobility  Bed Mobility Overal bed mobility: Needs Assistance Bed Mobility: Rolling;Sidelying to Sit Rolling: Modified independent (Device/Increase time) Sidelying to sit: Min guard   Sit to supine: Mod assist;HOB elevated(after fatigue)   General bed mobility comments: Pt requiring increased time and use of railing for general bed mobility  Transfers Overall transfer level: Needs assistance   Transfers: Sit  to/from Stand Sit to Stand: +2 physical assistance;Max assist         General transfer comment: Pt only able to perform half sit-to stand with max PT assist +2. Legs very weak today bilaterally.   Ambulation/Gait             General Gait Details: unable due to marked weakness   Stairs            Wheelchair Mobility    Modified Rankin (Stroke Patients Only)       Balance Overall balance assessment: Needs assistance Sitting-balance support: Feet supported;No upper extremity supported Sitting balance-Leahy Scale: Fair Sitting balance - Comments: pt with slightly decreased trunk control today. able to shift-weight, however minimally and needs assistance intermittently. verbal and tactile cueing to active spinal extensors in order to maintain upright trunk while seated.   Standing balance support: Bilateral upper extremity supported Standing balance-Leahy Scale: Zero Standing balance comment: max assist +2 and still unable to reach full standing position                            Cognition Arousal/Alertness: Awake/alert Behavior During Therapy: WFL for tasks assessed/performed Overall Cognitive Status: Within Functional Limits for tasks assessed                                        Exercises Other Exercises Other Exercises: 5 bridges in supine - pt unable to push through full-range.  Other Exercises: seated EOB reaching outside base of support: 1 upper right quardant, 1 upper left quadrant, 1 lower left quadrant (unable to  maintain balance)    General Comments General comments (skin integrity, edema, etc.): Pt showed marked decline in function as compared to previous PT visit. Pt c/o dizziness while seated at EOB. Pt 2+/5 for knee extension bilaterally while seated. Pt unable to control descent of supine heel slide (R>L).       Pertinent Vitals/Pain Pain Assessment: No/denies pain    Home Living                       Prior Function            PT Goals (current goals can now be found in the care plan section) Progress towards PT goals: Not progressing toward goals - comment(decline in function)    Frequency    Min 3X/week      PT Plan Current plan remains appropriate    Co-evaluation              AM-PAC PT "6 Clicks" Daily Activity  Outcome Measure  Difficulty turning over in bed (including adjusting bedclothes, sheets and blankets)?: A Little Difficulty moving from lying on back to sitting on the side of the bed? : A Little Difficulty sitting down on and standing up from a chair with arms (e.g., wheelchair, bedside commode, etc,.)?: Unable Help needed moving to and from a bed to chair (including a wheelchair)?: Total Help needed walking in hospital room?: Total Help needed climbing 3-5 steps with a railing? : Total 6 Click Score: 10    End of Session Equipment Utilized During Treatment: Gait belt Activity Tolerance: Patient tolerated treatment well;Patient limited by fatigue Patient left: in bed;with nursing/sitter in room;with call bell/phone within reach Nurse Communication: Mobility status PT Visit Diagnosis: Unsteadiness on feet (R26.81);Muscle weakness (generalized) (M62.81);Other symptoms and signs involving the nervous system (R29.898);Difficulty in walking, not elsewhere classified (R26.2)     Time: 1115-1150 PT Time Calculation (min) (ACUTE ONLY): 35 min  Charges:  $Therapeutic Activity: 23-37 mins                    G Codes:       Reina Fuse, SPT   Reina Fuse 01/11/2017, 1:35 PM

## 2017-01-12 DIAGNOSIS — G360 Neuromyelitis optica [Devic]: Secondary | ICD-10-CM

## 2017-01-12 LAB — CBC
HEMATOCRIT: 27.2 % — AB (ref 36.0–46.0)
HEMOGLOBIN: 8.8 g/dL — AB (ref 12.0–15.0)
MCH: 29.2 pg (ref 26.0–34.0)
MCHC: 32.4 g/dL (ref 30.0–36.0)
MCV: 90.4 fL (ref 78.0–100.0)
PLATELETS: 219 10*3/uL (ref 150–400)
RBC: 3.01 MIL/uL — AB (ref 3.87–5.11)
RDW: 15.1 % (ref 11.5–15.5)
WBC: 21.6 10*3/uL — ABNORMAL HIGH (ref 4.0–10.5)

## 2017-01-12 LAB — GLUCOSE, CAPILLARY
GLUCOSE-CAPILLARY: 102 mg/dL — AB (ref 65–99)
GLUCOSE-CAPILLARY: 215 mg/dL — AB (ref 65–99)
GLUCOSE-CAPILLARY: 85 mg/dL (ref 65–99)
Glucose-Capillary: 136 mg/dL — ABNORMAL HIGH (ref 65–99)
Glucose-Capillary: 279 mg/dL — ABNORMAL HIGH (ref 65–99)
Glucose-Capillary: 232 mg/dL — ABNORMAL HIGH (ref 65–99)

## 2017-01-12 LAB — BASIC METABOLIC PANEL
Anion gap: 7 (ref 5–15)
BUN: 20 mg/dL (ref 6–20)
CHLORIDE: 110 mmol/L (ref 101–111)
CO2: 18 mmol/L — AB (ref 22–32)
CREATININE: 1.38 mg/dL — AB (ref 0.44–1.00)
Calcium: 8.1 mg/dL — ABNORMAL LOW (ref 8.9–10.3)
GFR calc non Af Amer: 44 mL/min — ABNORMAL LOW (ref >60)
GFR, EST AFRICAN AMERICAN: 51 mL/min — AB (ref >60)
Glucose, Bld: 128 mg/dL — ABNORMAL HIGH (ref 65–99)
POTASSIUM: 3.8 mmol/L (ref 3.5–5.1)
Sodium: 135 mmol/L (ref 135–145)

## 2017-01-12 NOTE — Progress Notes (Signed)
Bladder scanned patient. 531cc. Paged [phone] and gave information to doctor. She is going to put order for indwelling foley catheter.

## 2017-01-12 NOTE — Care Management Note (Signed)
Case Management Note  Patient Details  Name: Debra [name] MRN: [id number] Date of Birth: 14-May-1965  Subjective/Objective:                    Action/Plan: Currently the plan is for admission to [facility] when medically ready. CM following.  Expected Discharge Date:                  Expected Discharge Plan:  IP Rehab Facility  In-House Referral:  Clinical Social Work  Discharge planning Services  CM Consult  Post Acute Care Choice:    Choice offered to:     DME Arranged:    DME Agency:     HH Arranged:    HH Agency:     Status of Service:  In process, will continue to follow  If discussed at Long Length of Stay Meetings, dates discussed:    Additional Comments:  Kermit Balo, RN 01/12/2017, 2:11 PM

## 2017-01-12 NOTE — Progress Notes (Signed)
IM Resident paged about patient not being able to urinate on her own. Bladder scan reveals 420. MD states to wait two hours till 2030 and see if patient can urinate on her own.

## 2017-01-12 NOTE — Progress Notes (Signed)
Upon entering the patient's room, as I was talking with her, I asked if she was still suicidal. She verified this, but states "I don't have a plan". Emotional support given. Safety maintained. Will monitor.

## 2017-01-12 NOTE — Progress Notes (Signed)
   01/12/17 1100  Clinical Encounter Type  Visited With Patient  Visit Type Spiritual support;Social support  Referral From Nurse  Consult/Referral To Chaplain  Spiritual Encounters  Spiritual Needs Emotional  Stress Factors  Patient Stress Factors Exhausted;Family relationships;Health changes  Chaplain visited with PT.  Consult said that the Pt was suicidal.  Chaplain made assessment that PT is having some difficulty coping with the new challenges she is facing with her new diagnosis.  PT strong belief does not lend itself to one who is suicidal.  PT expressed depression and not being able to see the light in the midst of darkness.  Chaplain used positive talk and looking at the other side of the coin to encourage PT.  Candelaria Stagers will continue to follow up

## 2017-01-12 NOTE — Clinical Social Work Note (Signed)
CSW received voicemail from Vivien Rossetti at [facility]. They want an update once patient is transitioned to PO antibiotics.  Charlynn Court, CSW [phone]

## 2017-01-12 NOTE — Progress Notes (Signed)
Received an order for an inpt rehab consult. Please refer to previous note on 01/05/17 by Fae Pippin, Admission Liaison. Case has been discussed with our Director, Kandis Mannan. We recommend SNF . Not a candidate to readmit to our program. I contacted Dr. Anthonette Legato and he is aware. [phone]

## 2017-01-12 NOTE — Progress Notes (Signed)
Subjective: No acute events overnight, the pt denies recurrent diaphoresis or chills, feels stable from yesterday. Reports her vision and LE weakness are stable and she walked with walker during work with PT yesterday. SI remains.   Objective:  Vital signs in last 24 hours: Vitals:   01/12/17 0004 01/12/17 0317 01/12/17 0600 01/12/17 1006  BP: 126/60 139/64 133/62 (!) 143/60  Pulse: 86 85 86 83  Resp: 20 20 20 20   Temp: 99 F (37.2 C) 99 F (37.2 C) 99 F (37.2 C) 98.7 F (37.1 C)  TempSrc: Oral Oral Oral Oral  SpO2: 99% 96% 97% 100%  Weight:   173 lb 11.6 oz (78.8 kg)   Height:       General: Laying in bed, stable general appearance without diaphoresis, chills  CV: Systolic murmur, normal rate and rhythm Resp: Lungs clear, normal work of breathing Abd: Soft, +BS, non-tender Extr: No swelling Neuro: Alert and oriented x3, 4/5 LE strength, 5/5 UEs Skin: Warm, dry.  Assessment/Plan:  Leukocytosis, Diaphoresis/Chills On 11/14 patient became acutely ill appearing and her white count had increased to 20.  Due to concern for possible infection, she started on broad-spectrum antibiotics after blood and urine cultures were obtained. Central line removed.  Blood cultures are NGTD, urine culture negative though U/A with white blood cell clumps.  Removing Foley today.  She has remained afebrile and hemodynamically stable and clinically appears improved, though her WBC has re-increased after initial improvement. --Cont Vanc, Cefepime per pharm  --CBC  Multiple Sclerosis, potential NMO  Patient has history of multiple sclerosis with lower extremity weakness and bladder/bowel incontinence which worsened, also experienced new vision changes.  MRI showed new demyelinating lesions.  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  She received IV steroids x2 doses and plasmapheresis x 4. LP performed showed increased protein, NMO Ab negative, oligoclonal bands negative. Her vision and  lower extremity weakness improved with plasmapheresis sessions.  Neurology consulted and followed the course of the admission.  Will need outpatient follow-up to initiate maintenance therapy.  Bilateral Leg Pain Pt reports bilateral burning and aching leg pain, likely a result of her multiple sclerosis and diabetes. She is on both cymbalta, gabapentin was transitioned to lyrica, which are ideal for neuropathic type pain. Will continue tylenol as well.  --Cont Lyrica   Acute on Chronic Kidney Injury (CKD III)  Pt presented with Elevated creatinine greater than 2 compared to baseline around 1.5. This improved early in admission and was back to her baseline before a bump during her clinical worsening. Has improved and is currently 1.38.  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  She was expressing suicidal ideation on admission, no significant change in mood.  She was previously on Cymbalta 60 mg and Risperdal 1 mg based on psychiatric evaluation at outside hospital.  Psych consulted here, appreciate updated recommendations. SW working to find psychiatric placement following medical management.  --Continue Cymbalta 60 mg, Remeron increased to 30 mg qhs  --Inpatient psych admission following medical stabilization  H/o Diabetes Earlier in admission patient increased blood glucose levels with recent steroid dosing and inpatient insulin regimen adjusted with better control.  Since that time, she has had difficult to control blood glucose values with a range of variation, currently in acceptable ranges with holding am Lantus, encouraging PO intake.   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 Foley placed and pt continued to have retention during initial attempt at  removal. Her neurologic sx have improved since last attempt, intended to trial voiding yesterday, will re-attempt today   --Remove foley, monitor urine output, bladder scan prn     Dispo: Anticipated discharge in approximately 2-3 day(s).   [name], MD 01/12/2017, 10:58 AM Pager: [phone]

## 2017-01-12 NOTE — Progress Notes (Signed)
Subjective: Patient started to feel stronger 2 days ago but still felt a little weaker yesterday.  Today she is feeling stronger again.  She has been placed on antibiotics at this point.  Exam: Vitals:   01/12/17 0600 01/12/17 1006  BP: 133/62 (!) 143/60  Pulse: 86 83  Resp: 20 20  Temp: 99 F (37.2 C) 98.7 F (37.1 C)  SpO2: 97% 100%    HEENT-  Normocephalic, no lesions, without obvious abnormality.  Normal external eye and conjunctiva.  Normal TM's bilaterally.  Normal auditory canals and external ears. Normal external nose, mucus membranes and septum.  Normal pharynx. Cardiovascular- S1, S2 normal, pulses palpable throughout   Lungs- chest clear, no wheezing, rales, normal symmetric air entry Abdomen- normal findings: bowel sounds normal Extremities- no edema   Neuro:  Neuro: MS: Awake, alert, interactive and appropriate CN: Left ptosis, visual fields full.. Motor: She has 4-/5 strength in bilateral lower extremities, 4+/5 strength bilateral upper extremities. Sensory: Intact light touch    Medications:  Scheduled: . amLODipine  10 mg Oral Daily  . aspirin EC  81 mg Oral Daily  . atorvastatin  40 mg Oral Daily  . bethanechol  50 mg Oral TID  . docusate sodium  100 mg Oral Daily  . DULoxetine  60 mg Oral Daily  . hydrALAZINE  75 mg Oral Q8H  . insulin aspart  0-5 Units Subcutaneous QHS  . insulin aspart  0-9 Units Subcutaneous TID WC  . insulin aspart  10 Units Subcutaneous TID WC  . insulin glargine  25 Units Subcutaneous QHS  . metoprolol succinate  75 mg Oral Daily  . mirtazapine  30 mg Oral QHS  . pantoprazole  40 mg Oral Daily  . pregabalin  100 mg Oral BID  . sodium chloride flush  3 mL Intravenous Q12H    Pertinent Labs/Diagnostics: None  Dg Chest Port 1 View  Result Date: 01/10/2017 CLINICAL DATA:  Chills, diaphoresis, and leukocytosis. EXAM: PORTABLE CHEST 1 VIEW COMPARISON:  01/03/2017 FINDINGS: Right jugular catheter terminates over the mid to  lower SVC, unchanged. The cardiac silhouette remains enlarged. No confluent airspace opacity, edema, pleural effusion, or pneumothorax is identified. IMPRESSION: No active disease. Electronically Signed   By: Sebastian Ache M.D.   On: 01/10/2017 12:22     Felicie Morn PA-C [facility] [phone]  Impression:  51 year old female with recurrent MS flare now s/p PLEX. She received 4 treatments, but developed concern for infection and given her improvement, I favored discontinuing the plasma exchange. Treatment from this point forward will be supportive   Recommendations: Neurology to sign off at this time, please call with further questions or concerns.   Ritta Slot, MD [facility] [phone]  If 7pm- 7am, please page neurology on call as listed in AMION.   01/12/2017, 11:29 AM

## 2017-01-12 NOTE — Progress Notes (Signed)
Physical Therapy Treatment Patient Details Name: Debra [name] MRN: [id number] DOB: 06/21/1965 Today's Date: 01/12/2017    History of Present Illness Pt is a 51 y/o female admitted to [facility] secondary to MS exacerbation and major depressive disorder. Pt transferred to [facility] for insertion of R IJ catheter and plasmophoresis treatments. Pt also very depressed about recent decline in function and psychiatry following. Pt only completed 4/5 PLEX and was discontinued (possible infection acquired). PMH includes MS, HTN, DM with neuropathy, depression, heart murmur, and restless leg syndrome.     PT Comments    Pt is progressing towards PT goals. Pt tolerates sit-to-stand using Stedy and assist +3 (+2 mod assist and +1 to block knees). Pt reports that she has been practicing exercises given by PT and is able to demonstrate. Pt remains good candidate for skilled PT in order to improve mobility, strength, and balance to increase level of independence.    Follow Up Recommendations  SNF;Supervision/Assistance - 24 hour     Equipment Recommendations  None recommended by PT    Recommendations for Other Services       Precautions / Restrictions Precautions Precautions: Fall Precaution Comments: suicide precautions  Restrictions Weight Bearing Restrictions: No    Mobility  Bed Mobility Overal bed mobility: Needs Assistance             General bed mobility comments: Pt OOB in chair upon arrival  Transfers Overall transfer level: Needs assistance   Transfers: Sit to/from Stand Sit to Stand: +2 physical assistance;Mod assist(+1 for knee block)         General transfer comment: sit-to-stand x2 with +3 assist (2+ mod and 1+ to block knees and lock out) and VCs to bring trunk upright  Ambulation/Gait             General Gait Details: unable due to bilateral leg weakness   Stairs            Wheelchair Mobility    Modified Rankin (Stroke Patients Only)        Balance Overall balance assessment: Needs assistance Sitting-balance support: No upper extremity supported;Feet supported Sitting balance-Leahy Scale: Good Sitting balance - Comments: Pt able to reach outside BOS while maintaining sitting balance. Pt toelrated moderate perturbations while seated with one foot supported.    Standing balance support: Bilateral upper extremity supported Standing balance-Leahy Scale: Poor Standing balance comment: Pt stands heavily reliant on UEs on Stedy with knees blocked +1 and mod assist +2.                             Cognition Arousal/Alertness: Awake/alert Behavior During Therapy: WFL for tasks assessed/performed Overall Cognitive Status: Within Functional Limits for tasks assessed                                        Exercises General Exercises - Lower Extremity Ankle Circles/Pumps: Seated;Both;10 reps;AROM Long Arc Quad: AAROM;Both;Seated(5 reps) Hip Flexion/Marching: 10 reps;AROM;Limitations;Both;Seated Hip Flexion/Marching Limitations: only able to lift legs slightly off of chair Other Exercises Other Exercises: reaching outside BOS x20 (combination of right, left, and forward) using rolling table for UE support (seated) Other Exercises: 10x reach outside BOS (2x upper right, 2x upper left, 2x lower right, and 2x lower left quadrants)    General Comments        Pertinent Vitals/Pain Pain Assessment: 0-10  Pain Score: 4  Pain Location: L thigh Pain Descriptors / Indicators: Aching Pain Intervention(s): Limited activity within patient's tolerance;Monitored during session    Home Living                      Prior Function            PT Goals (current goals can now be found in the care plan section) Progress towards PT goals: Progressing toward goals    Frequency    Min 2X/week      PT Plan Current plan remains appropriate    Co-evaluation              AM-PAC PT "6 Clicks"  Daily Activity  Outcome Measure  Difficulty turning over in bed (including adjusting bedclothes, sheets and blankets)?: A Little Difficulty moving from lying on back to sitting on the side of the bed? : A Little Difficulty sitting down on and standing up from a chair with arms (e.g., wheelchair, bedside commode, etc,.)?: Unable Help needed moving to and from a bed to chair (including a wheelchair)?: Total Help needed walking in hospital room?: Total Help needed climbing 3-5 steps with a railing? : Total 6 Click Score: 10    End of Session Equipment Utilized During Treatment: Gait belt Activity Tolerance: Patient tolerated treatment well;Patient limited by fatigue Patient left: with call bell/phone within reach;with nursing/sitter in room;in chair Nurse Communication: Mobility status PT Visit Diagnosis: Unsteadiness on feet (R26.81);Muscle weakness (generalized) (M62.81);Other symptoms and signs involving the nervous system (R29.898);Difficulty in walking, not elsewhere classified (R26.2)     Time: 4268-3419 PT Time Calculation (min) (ACUTE ONLY): 26 min  Charges:  $Therapeutic Activity: 8-22 mins $Neuromuscular Re-education: 8-22 mins                    G Codes:       Reina Fuse, SPT   Reina Fuse 01/12/2017, 3:49 PM

## 2017-01-13 DIAGNOSIS — R011 Cardiac murmur, unspecified: Secondary | ICD-10-CM

## 2017-01-13 LAB — CBC
HEMATOCRIT: 27.5 % — AB (ref 36.0–46.0)
HEMOGLOBIN: 8.7 g/dL — AB (ref 12.0–15.0)
MCH: 28.6 pg (ref 26.0–34.0)
MCHC: 31.6 g/dL (ref 30.0–36.0)
MCV: 90.5 fL (ref 78.0–100.0)
Platelets: 257 10*3/uL (ref 150–400)
RBC: 3.04 MIL/uL — ABNORMAL LOW (ref 3.87–5.11)
RDW: 15.1 % (ref 11.5–15.5)
WBC: 20.4 10*3/uL — AB (ref 4.0–10.5)

## 2017-01-13 LAB — GLUCOSE, CAPILLARY
GLUCOSE-CAPILLARY: 172 mg/dL — AB (ref 65–99)
GLUCOSE-CAPILLARY: 206 mg/dL — AB (ref 65–99)
GLUCOSE-CAPILLARY: 80 mg/dL (ref 65–99)
Glucose-Capillary: 198 mg/dL — ABNORMAL HIGH (ref 65–99)
Glucose-Capillary: 182 mg/dL — ABNORMAL HIGH (ref 65–99)
Glucose-Capillary: 122 mg/dL — ABNORMAL HIGH (ref 65–99)
Glucose-Capillary: 60 mg/dL — ABNORMAL LOW (ref 65–99)

## 2017-01-13 LAB — BASIC METABOLIC PANEL
Anion gap: 7 (ref 5–15)
BUN: 17 mg/dL (ref 6–20)
CALCIUM: 8.5 mg/dL — AB (ref 8.9–10.3)
CO2: 20 mmol/L — AB (ref 22–32)
CREATININE: 1.35 mg/dL — AB (ref 0.44–1.00)
Chloride: 109 mmol/L (ref 101–111)
GFR, EST AFRICAN AMERICAN: 52 mL/min — AB (ref >60)
GFR, EST NON AFRICAN AMERICAN: 45 mL/min — AB (ref >60)
Glucose, Bld: 173 mg/dL — ABNORMAL HIGH (ref 65–99)
Potassium: 3.9 mmol/L (ref 3.5–5.1)
SODIUM: 136 mmol/L (ref 135–145)

## 2017-01-13 MED ORDER — HEPARIN SODIUM (PORCINE) 5000 UNIT/ML IJ SOLN
5000.0000 [IU] | Freq: Three times a day (TID) | INTRAMUSCULAR | Status: DC
  Administered 2017-01-13 – 2017-02-15 (×98): 5000 [IU] via SUBCUTANEOUS
  Filled 2017-01-13 (×103): qty 1

## 2017-01-13 MED ORDER — TRAMADOL HCL 50 MG PO TABS
50.0000 mg | ORAL_TABLET | Freq: Once | ORAL | Status: AC
  Administered 2017-01-13: 50 mg via ORAL
  Filled 2017-01-13: qty 1

## 2017-01-13 NOTE — Progress Notes (Signed)
While talking with patient I asked her "are you still having suicidal ideation?" She said "not as much". Patient with flat affect, appears quiet and wanting to sleep. I asked her "Did the chaplain see you today" she responded "yes, it was helpful". Will continue to monitor.

## 2017-01-13 NOTE — Progress Notes (Signed)
Subjective:   No acute events overnight, still has weakness in the legs. Denies any other events. Denies fevers. Foley back in. Explained we are stopping antibiotics   Objective:  Vital signs in last 24 hours: Vitals:   01/12/17 1700 01/12/17 2011 01/12/17 2335 01/13/17 0338  BP: (!) 148/72 139/61 140/61 (!) 154/73  Pulse: 80 80 86 87  Resp: 16 16 16 18   Temp: 97.9 F (36.6 C) 98.4 F (36.9 C) 98.6 F (37 C) 98.3 F (36.8 C)  TempSrc: Oral Oral Oral Oral  SpO2: 99% 98% 100% 100%  Weight:      Height:       General: Laying in bed, stable general appearance without diaphoresis, chills  CV: Systolic murmur, normal rate and rhythm Resp: Lungs clear, normal work of breathing Abd: Soft, +BS, non-tender Extr: No swelling -GU- foley back   Assessment/Plan:  Leukocytosis, Diaphoresis/Chills On 11/14 patient became acutely ill appearing and her white count had increased to 20.  Due to concern for possible infection, she started on broad-spectrum antibiotics after blood and urine cultures were obtained. Central line removed.  Blood cultures are NGTD, urine culture negative though U/A with white blood cell clumps.   She has remained afebrile and hemodynamically stable and clinically appears improved, though her WBC has continued to be elevated. Foley was put back in last night as she was not able to void on her own  -stopped antibiotics -will monitor fever curve   Bilateral Leg Pain Pt reports bilateral burning and aching leg pain, likely a result of her multiple sclerosis and diabetes. She is on both cymbalta, gabapentin was transitioned to lyrica, which are ideal for neuropathic type pain. Will continue tylenol as well.  --Cont Lyrica   Multiple Sclerosis, potential NMO  Patient has history of multiple sclerosis with lower extremity weakness and bladder/bowel incontinence which worsened, also experienced new vision changes.  MRI showed new demyelinating lesions.  Concern for  neuromyelitis optica given vision changes.  She received IV steroids x2 doses and plasmapheresis x 4. LP performed showed increased protein, NMO Ab negative, oligoclonal bands negative. Her vision and lower extremity weakness improved with plasmapheresis sessions.  Neurology consulted and followed the course of the admission.  Will need outpatient follow-up to initiate maintenance therapy.  Acute on Chronic Kidney Injury (CKD III)  Pt presented with Elevated creatinine greater than 2 compared to baseline around 1.5. This improved early in admission and was back to her baseline before a bump during her clinical worsening. Has improved and is currently 1.38.  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  She was expressing suicidal ideation on admission, no significant change in mood.  She was previously on Cymbalta 60 mg and Risperdal 1 mg based on psychiatric evaluation at outside hospital.  Psych consulted here, appreciate updated recommendations. SW working to find psychiatric placement following medical management.  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 Foley placed and pt continued to have retention during initial attempt at removal. Her neurologic sx have improved since last attempt but was not able to void so foley put back in  Dispo: Anticipated discharge in approximately 3 days  Deneise LeverSaraiya, Symone Cornman, MD 01/13/2017, 10:40 AM

## 2017-01-13 NOTE — Progress Notes (Signed)
Patient's CBG registered 60, gave sprite. Did retake it is 42. Will page MD . Patient also states pain is 8/10 and tylenol does not help.Will Page MD.

## 2017-01-13 NOTE — Progress Notes (Signed)
Internal medicine returned page. Gave order to give lantus, continue to monitor and he will put something in for pain. Did speak with patient and she has flat affect, with suicidal ideations. Sitter at bedside. Safety maintained.

## 2017-01-14 LAB — GLUCOSE, CAPILLARY
GLUCOSE-CAPILLARY: 243 mg/dL — AB (ref 65–99)
GLUCOSE-CAPILLARY: 142 mg/dL — AB (ref 65–99)
Glucose-Capillary: 321 mg/dL — ABNORMAL HIGH (ref 65–99)
Glucose-Capillary: 282 mg/dL — ABNORMAL HIGH (ref 65–99)
Glucose-Capillary: 143 mg/dL — ABNORMAL HIGH (ref 65–99)
Glucose-Capillary: 79 mg/dL (ref 65–99)

## 2017-01-14 LAB — BASIC METABOLIC PANEL
Anion gap: 6 (ref 5–15)
BUN: 16 mg/dL (ref 6–20)
CALCIUM: 8.8 mg/dL — AB (ref 8.9–10.3)
CO2: 21 mmol/L — ABNORMAL LOW (ref 22–32)
Chloride: 110 mmol/L (ref 101–111)
Creatinine, Ser: 1.38 mg/dL — ABNORMAL HIGH (ref 0.44–1.00)
GFR calc Af Amer: 51 mL/min — ABNORMAL LOW (ref >60)
GFR, EST NON AFRICAN AMERICAN: 44 mL/min — AB (ref >60)
GLUCOSE: 273 mg/dL — AB (ref 65–99)
Potassium: 4 mmol/L (ref 3.5–5.1)
Sodium: 137 mmol/L (ref 135–145)

## 2017-01-14 LAB — CBC
HCT: 27.4 % — ABNORMAL LOW (ref 36.0–46.0)
Hemoglobin: 8.8 g/dL — ABNORMAL LOW (ref 12.0–15.0)
MCH: 28.8 pg (ref 26.0–34.0)
MCHC: 32.1 g/dL (ref 30.0–36.0)
MCV: 89.5 fL (ref 78.0–100.0)
Platelets: 303 10*3/uL (ref 150–400)
RBC: 3.06 MIL/uL — ABNORMAL LOW (ref 3.87–5.11)
RDW: 14.8 % (ref 11.5–15.5)
WBC: 15.9 10*3/uL — ABNORMAL HIGH (ref 4.0–10.5)

## 2017-01-14 NOTE — Progress Notes (Signed)
Subjective:   No acute events overnight, she continues to report her vision and lower extremity weakness are stable.  She is eager to work continue to work with physical therapy for her lower extremity weakness.  She does continue to complain of bilateral leg pain. [facility]   Objective:  Vital signs in last 24 hours: Vitals:   01/13/17 1817 01/13/17 2106 01/14/17 0056 01/14/17 0623  BP: (!) 147/62 (!) 125/52 (!) 170/81 (!) 155/60  Pulse: 81 81 85 88  Resp: 16 18 16 16   Temp: 98.4 F (36.9 C) 98.6 F (37 C) 98.1 F (36.7 C) 98.2 F (36.8 C)  TempSrc: Oral Oral Oral Oral  SpO2: 100% 100% 100% 96%  Weight:   168 lb 14 oz (76.6 kg)   Height:       General: Sitting in bed eating breakfast, no acute distress  CV: Systolic murmur, normal rate and rhythm Resp: Lungs clear, normal work of breathing Abd: Soft, non-tender  Extr: No swelling Neuro: Alert and oriented, Stable 4/5 LE strength  GU: Foley in place   Assessment/Plan:  Leukocytosis, Diaphoresis/Chills  On 11/14 patient became acutely ill appearing and her white count had increased to 20.  Due to concern for possible infection, she started on broad-spectrum antibiotics after blood and urine cultures were obtained. Central line removed.  Blood cultures are NGTD, urine culture negative though U/A with white blood cell clumps.   She has remained afebrile and hemodynamically stable and clinically appears improved without clear infection source. Abx discontinued 11/17 and she remains stable and well-appearing.  -Monitor fever curve and sx   Bilateral Leg Pain Pt reports bilateral burning and aching leg pain, likely a result of her multiple sclerosis and diabetes. She is on both cymbalta, gabapentin was transitioned to lyrica, which are ideal for neuropathic type pain though this pain is difficult to control. She has received intermittent tramadol doses during the  night. Will continue tylenol as well.  --Cont Lyrica   Multiple Sclerosis, potential NMO  Patient has history of multiple sclerosis with lower extremity weakness and bladder/bowel incontinence which worsened, also experienced new vision changes.  MRI showed new demyelinating lesions.  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  She received IV steroids x2 doses and plasmapheresis x 4. LP performed showed increased protein, NMO Ab negative, oligoclonal bands negative. Her vision and lower extremity weakness improved with plasmapheresis sessions.  Neurology consulted and followed the course of the admission.  Will need outpatient follow-up to initiate maintenance therapy.  Acute on Chronic Kidney Injury (CKD III), resolved   Pt presented with elevated creatinine greater than 2 compared to baseline around 1.5. This improved early in admission and was back to her baseline before a bump during her clinical worsening. Has improved and is currently 1.38.  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  She was expressing suicidal ideation on admission, no significant change in mood.  She was previously on Cymbalta 60 mg and Risperdal 1 mg based on psychiatric evaluation at outside hospital.  Psych consulted here, appreciate updated recommendations. SW working to find psychiatric placement following medical management.  --Continue Cymbalta 60 mg, Remeron 30 mg qhs  --Inpatient psych admission following medical stabilization  H/o Diabetes  CBGs have been variable, continue to monitor. 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 Foley placed and pt continued to have retention during voiding trials on two separate occassions. Foley currently in place.       Dispo: Anticipated discharge pending  disposition issues.   [name], MD 01/14/2017, 7:27 AM

## 2017-01-14 NOTE — Progress Notes (Signed)
Rounding on patient. Sleeping soundly on left side. Sitter at bedside within arms reach. Safety maintained.

## 2017-01-15 DIAGNOSIS — E119 Type 2 diabetes mellitus without complications: Secondary | ICD-10-CM

## 2017-01-15 LAB — GLUCOSE, CAPILLARY
GLUCOSE-CAPILLARY: 354 mg/dL — AB (ref 65–99)
GLUCOSE-CAPILLARY: 269 mg/dL — AB (ref 65–99)
GLUCOSE-CAPILLARY: 121 mg/dL — AB (ref 65–99)
Glucose-Capillary: 153 mg/dL — ABNORMAL HIGH (ref 65–99)
Glucose-Capillary: 210 mg/dL — ABNORMAL HIGH (ref 65–99)
Glucose-Capillary: 309 mg/dL — ABNORMAL HIGH (ref 65–99)

## 2017-01-15 LAB — CBC
HEMATOCRIT: 27.1 % — AB (ref 36.0–46.0)
HEMOGLOBIN: 8.6 g/dL — AB (ref 12.0–15.0)
MCH: 28.6 pg (ref 26.0–34.0)
MCHC: 31.7 g/dL (ref 30.0–36.0)
MCV: 90 fL (ref 78.0–100.0)
Platelets: 321 10*3/uL (ref 150–400)
RBC: 3.01 MIL/uL — ABNORMAL LOW (ref 3.87–5.11)
RDW: 14.7 % (ref 11.5–15.5)
WBC: 9.6 10*3/uL (ref 4.0–10.5)

## 2017-01-15 LAB — CULTURE, BLOOD (ROUTINE X 2)
Culture: NO GROWTH
Culture: NO GROWTH
SPECIAL REQUESTS: ADEQUATE
Special Requests: ADEQUATE

## 2017-01-15 MED ORDER — TRAMADOL HCL 50 MG PO TABS
50.0000 mg | ORAL_TABLET | Freq: Four times a day (QID) | ORAL | Status: DC | PRN
Start: 2017-01-15 — End: 2017-02-15
  Administered 2017-01-15 – 2017-02-13 (×17): 50 mg via ORAL
  Filled 2017-01-15 (×18): qty 1

## 2017-01-15 MED ORDER — PREGABALIN 50 MG PO CAPS
100.0000 mg | ORAL_CAPSULE | Freq: Three times a day (TID) | ORAL | Status: DC
  Administered 2017-01-15 – 2017-02-14 (×92): 100 mg via ORAL
  Filled 2017-01-15 (×92): qty 2

## 2017-01-15 NOTE — Progress Notes (Signed)
   01/15/17 1100  Clinical Encounter Type  Visited With Patient;Health care provider  Visit Type Follow-up;Spiritual support;Social support  Consult/Referral To Chaplain  Spiritual Encounters  Spiritual Needs Emotional  Stress Factors  Patient Stress Factors Exhausted;Health changes;Major life changes  Chaplain visited with the PT.  PT expressed that she felt about the same as she did during my last visit.  PT was smiling and laughing and joking as Chaplain tried to maintain a positive mood for the visit.  PT talked about food articles that she would like including a Whopper, Crunchy Cheetos and M&M's.  PT talked about her past with hot flashes and passing out.  Again the PT talked about her faith.  She seemed more hopeful at what the day has to offer.

## 2017-01-15 NOTE — Progress Notes (Signed)
CSW met with Debra [name], APS Social Worker, earlier this morning. Patient is involved in an active APS investigation, due to inability to independently care for herself and reports that her children are not actively stepping up in order to provide the care that the patient needs at this time.  APS Social Worker requested medical documentation to aid in the investigation; CSW provided notes that indicated what is occurring with the patient's physical health, mental health, social support, and activities of daily living via fax.   CSW will continue to follow.  Laveda Abbe,  Clinical Social Worker [phone]

## 2017-01-15 NOTE — Consult Note (Signed)
[facility] Psych Consult Progress Note  01/15/2017 5:49 PM Debra [name]  MRN:  [id number] Subjective:   Debra [name] reports feeling "horrible." She reports multiple somatic complaints. She denies problems with increasing Remeron. She continues to have SI and denies any improvement in her mood. She reports that her sleep is poor due to restlessness. She also reports poor appetite. Nursing reports that she was laughing earlier and it is documented that she exhibited a brighter affect when the chaplin came to visit her.   Principal Problem: Major depressive disorder, single episode, severe without psychosis ([place]) Diagnosis:   Patient Active Problem List   Diagnosis Date Noted  . Major depressive disorder, single episode, severe without psychosis ([place]) [F32.2] 01/02/2017  . Multiple sclerosis exacerbation ([place]) [G35] 01/01/2017  . Dysthymia [F34.1] 12/27/2016  . Major depressive disorder, recurrent, severe with psychotic features ([place]) [F33.3]   . Multiple joint pain [M25.50]   . HTN (hypertension) [I10]   . Acute pain of left knee [M25.562]   . Neurogenic bladder [N31.9]   . Acute lower UTI [N39.0]   . Labile blood pressure [R09.89]   . Acute blood loss anemia [D62]   . Leukocytosis [D72.829]   . Labile blood glucose [R73.09]   . Hypertensive crisis [I16.9]   . Hypoglycemia [E16.2]   . Poorly controlled type 2 diabetes mellitus with peripheral neuropathy (HCC) [E11.42, E11.65]   . Uncontrolled hypertension [I10]   . Sleep disturbance [G47.9]   . Multiple sclerosis ([place]) [G35] 11/22/2016  . Thoracic root lesion [G54.3] 11/22/2016  . MS (multiple sclerosis) ([place]) [G35]   . Neuropathic pain [M79.2]   . Uncontrolled type 2 diabetes mellitus with peripheral neuropathy (HCC) [E11.42, E11.65]   . Urinary retention [R33.9]   . Medically noncompliant [Z91.19]   . Leg weakness, bilateral [R29.898] 11/20/2016  . Diabetic ketoacidosis without coma associated with type 1 diabetes mellitus ([place])  [E10.10]   . Leg pain [M79.606]   . History of CVA (cerebrovascular accident) [Z86.73]   . 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 Benign essential HTN [I10]   . Hypertensive urgency [I16.0]   . AKI (acute kidney injury) ([place]) [N17.9]   . Fibromyalgia [M79.7] 11/07/2016  . Back pain [M54.9] 10/31/2016  . Stable angina pectoris ([place]) [I20.8]   . Vitamin D deficiency [E55.9] 08/31/2014  . 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 Hyperlipidemia [E78.5] 10/06/2013  . Depression [F32.9] 10/16/2012  . Bilateral knee pain [M25.561, M25.562] 02/16/2012  . Chronic diarrhea [K52.9] 10/19/2010  . Peripheral neuropathy [G62.9] 08/19/2010  . Type 1 diabetes mellitus ([place]) [E10.9] 07/06/2010  . Hypertension [I10] 07/06/2010  . Asthma [J45.909] 07/06/2010  . Migraine [G43.909] 07/06/2010   Total Time spent with patient: 15 minutes  Past Psychiatric History: Depression   Past Medical History:  Past Medical History:  Diagnosis Date  . Adenomatous colonic polyps   . Anemia    2005  . Anxiety    1990  . Arthritis   . Asthma    2000  . Cataract   . Depression 1990  . Difficult intubation    narrow airway  . Gastroesophageal Reflux Disease (GERD)   . Heart murmur    Birth  . Hyperlipidemia    2005  . Hypertension    1998  . Internal hemorrhoids 04/24/06   on colonoscopy  . Migraine   . Neuropathy of the hands & feet   . Restless Leg  Syndrome   . Right Ankle Fracture 10/06/2013  . Sleep paralysis   . Stable angina pectoris    2007: cath showing normal cors.   . Stroke 1990  . Type I Diabetes Mellitus 1988    Past Surgical History:  Procedure Laterality Date  . bilateral foot surgery    . BREAST SURGERY Left    biopsy left breast  . CARDIAC CATHETERIZATION  2007   around 2007 or 2008  . CESAREAN SECTION    . CHOLECYSTECTOMY    . COLONOSCOPY W/ BIOPSIES AND POLYPECTOMY    .  DILATATION AND CURETTAGE  12/29/2010   Performed by Osborne Oman, MD at [facility] ORS  . ENDOMETRIAL ABLATION W/ NOVASURE N/A 12/2009  . EYE SURGERY    . HYSTEROSCOPY WITH HYDROTHERMAL ABLATION  12/29/2010   Performed by Osborne Oman, MD at [facility] ORS  . INTRAUTERINE DEVICE (IUD) REMOVAL N/A 12/29/2010   Performed by Osborne Oman, MD at [facility] ORS  . LAPAROSCOPIC TUBAL LIGATION Bilateral 12/29/2010   Performed by Osborne Oman, MD at [facility] ORS  . Open reduction internal fixation right ankle Right 10/09/2013   Performed by Nita Sells, MD at [place]  . TRIGGER FINGER RELEASE     x 3   Family History:  Family History  Problem Relation Age of Onset  . Hypertension Mother   . Kidney disease Mother   . Hypertension Father   . Breast cancer Maternal Grandmother   . Prostate cancer Maternal Grandfather   . Ovarian cancer Paternal Grandmother   . Prostate cancer Paternal Grandfather   . Colon cancer Maternal Uncle        Family history of malignant neoplasm of gastrointestinal tract   Family Psychiatric  History: Denies  Social History:  Social History   Substance and Sexual Activity  Alcohol Use No  . Alcohol/week: 0.0 oz     Social History   Substance and Sexual Activity  Drug Use No   Comment: drug addict    Social History   Socioeconomic History  . Marital status: Divorced    Spouse name: None  . Number of children: 2  . Years of education: 48  . Highest education level: None  Social Needs  . Financial resource strain: None  . Food insecurity - worry: None  . Food insecurity - inability: None  . Transportation needs - medical: None  . Transportation needs - non-medical: None  Occupational History  . Occupation: Unemployed  Tobacco Use  . Smoking status: Never Smoker  . Smokeless tobacco: Never Used  Substance and Sexual Activity  . Alcohol use: No    Alcohol/week: 0.0 oz  . Drug use: No    Comment: drug addict  . Sexual activity: Yes    Birth  control/protection: Other-see comments    Comment: pt states she had ablation in 2011  Other Topics Concern  . None  Social History Narrative   Lost medicaid about 2009 ish when her youngest child turned 38. Has adult children. Lives with boyfriend who financially supports her. Has attempted to get disability but has been turned down.      2-3 caffeine drinks daily     Sleep: Poor  Appetite:  Poor  Current Medications: Current Facility-Administered Medications  Medication Dose Route Frequency Provider Last Rate Last Dose  . acetaminophen (TYLENOL) tablet 650 mg  650 mg Oral Q4H PRN Patrecia Pour, NP   650 mg at 01/14/17 1138  . albuterol (  PROVENTIL) (2.5 MG/3ML) 0.083% nebulizer solution 2.5 mg  2.5 mg Inhalation Q4H PRN Patrecia Pour, NP      . alum & mag hydroxide-simeth (MAALOX/MYLANTA) 200-200-20 MG/5ML suspension 30 mL  30 mL Oral Q4H PRN Patrecia Pour, NP      . amLODipine (NORVASC) tablet 10 mg  10 mg Oral Daily Tawny Asal, MD   10 mg at 01/15/17 0936  . aspirin EC tablet 81 mg  81 mg Oral Daily Aldine Contes, MD   81 mg at 01/15/17 0931  . atorvastatin (LIPITOR) tablet 40 mg  40 mg Oral Daily Aldine Contes, MD   40 mg at 01/15/17 0931  . bethanechol (URECHOLINE) tablet 50 mg  50 mg Oral TID Patrecia Pour, NP   50 mg at 01/15/17 1600  . DULoxetine (CYMBALTA) DR capsule 60 mg  60 mg Oral Daily Tawny Asal, MD   60 mg at 01/15/17 0930  . heparin injection 5,000 Units  5,000 Units Subcutaneous Q8H Burgess Estelle, MD   5,000 Units at 01/15/17 1434  . hydrALAZINE (APRESOLINE) tablet 75 mg  75 mg Oral Q8H Nedrud, Marybeth, MD   75 mg at 01/15/17 1434  . insulin aspart (novoLOG) injection 0-5 Units  0-5 Units Subcutaneous QHS Tawny Asal, MD   2 Units at 01/13/17 0018  . insulin aspart (novoLOG) injection 0-9 Units  0-9 Units Subcutaneous TID WC Tawny Asal, MD   1 Units at 01/15/17 1735  . insulin aspart (novoLOG) injection 10 Units  10 Units Subcutaneous TID WC  Aldine Contes, MD   10 Units at 01/15/17 1736  . insulin glargine (LANTUS) injection 25 Units  25 Units Subcutaneous QHS Thomasene Ripple, MD   25 Units at 01/13/17 2234  . magnesium hydroxide (MILK OF MAGNESIA) suspension 30 mL  30 mL Oral Daily PRN Patrecia Pour, NP   30 mL at 01/10/17 0050  . metoprolol succinate (TOPROL-XL) 24 hr tablet 75 mg  75 mg Oral Daily Tawny Asal, MD   75 mg at 01/15/17 0935  . mirtazapine (REMERON) tablet 30 mg  30 mg Oral QHS Tawny Asal, MD   30 mg at 01/14/17 2135  . ondansetron (ZOFRAN) tablet 4 mg  4 mg Oral Q8H PRN Patrecia Pour, NP   4 mg at 01/05/17 1211  . pantoprazole (PROTONIX) EC tablet 40 mg  40 mg Oral Daily Aldine Contes, MD   40 mg at 01/15/17 0931  . polyethylene glycol (MIRALAX / GLYCOLAX) packet 17 g  17 g Oral Daily PRN Tawny Asal, MD      . pregabalin (LYRICA) capsule 100 mg  100 mg Oral TID Tawny Asal, MD   100 mg at 01/15/17 1600  . sodium chloride flush (NS) 0.9 % injection 10-40 mL  10-40 mL Intracatheter PRN Aldine Contes, MD   10 mL at 01/10/17 0816  . sodium chloride flush (NS) 0.9 % injection 3 mL  3 mL Intravenous Q12H Burgess Estelle, MD   3 mL at 01/14/17 2136  . traMADol (ULTRAM) tablet 50 mg  50 mg Oral Q6H PRN Tawny Asal, MD   50 mg at 01/15/17 1241  . zolpidem (AMBIEN) tablet 5 mg  5 mg Oral QHS PRN Patrecia Pour, NP   5 mg at 01/13/17 2236    Lab Results:  Results for orders placed or performed during the hospital encounter of 01/02/17 (from the past 48 hour(s))  Glucose, capillary     Status: Abnormal   Collection Time: 01/13/17  8:55 PM  Result Value Ref Range   Glucose-Capillary 60 (L) 65 - 99 mg/dL   Comment 1 Notify RN   Glucose, capillary     Status: None   Collection Time: 01/13/17  9:43 PM  Result Value Ref Range   Glucose-Capillary 80 65 - 99 mg/dL   Comment 1 Notify RN    Comment 2 Document in Chart   Glucose, capillary     Status: Abnormal   Collection Time: 01/13/17 11:54 PM   Result Value Ref Range   Glucose-Capillary 172 (H) 65 - 99 mg/dL   Comment 1 Notify RN    Comment 2 Document in Chart   Glucose, capillary     Status: Abnormal   Collection Time: 01/14/17  3:54 AM  Result Value Ref Range   Glucose-Capillary 243 (H) 65 - 99 mg/dL   Comment 1 Notify RN    Comment 2 Document in Chart   Basic metabolic panel     Status: Abnormal   Collection Time: 01/14/17  4:05 AM  Result Value Ref Range   Sodium 137 135 - 145 mmol/L   Potassium 4.0 3.5 - 5.1 mmol/L   Chloride 110 101 - 111 mmol/L   CO2 21 (L) 22 - 32 mmol/L   Glucose, Bld 273 (H) 65 - 99 mg/dL   BUN 16 6 - 20 mg/dL   Creatinine, Ser 1.38 (H) 0.44 - 1.00 mg/dL   Calcium 8.8 (L) 8.9 - 10.3 mg/dL   GFR calc non Af Amer 44 (L) >60 mL/min   GFR calc Af Amer 51 (L) >60 mL/min    Comment: (NOTE) The eGFR has been calculated using the CKD EPI equation. This calculation has not been validated in all clinical situations. eGFR's persistently <60 mL/min signify possible Chronic Kidney Disease.    Anion gap 6 5 - 15  CBC     Status: Abnormal   Collection Time: 01/14/17  4:05 AM  Result Value Ref Range   WBC 15.9 (H) 4.0 - 10.5 K/uL   RBC 3.06 (L) 3.87 - 5.11 MIL/uL   Hemoglobin 8.8 (L) 12.0 - 15.0 g/dL   HCT 27.4 (L) 36.0 - 46.0 %   MCV 89.5 78.0 - 100.0 fL   MCH 28.8 26.0 - 34.0 pg   MCHC 32.1 30.0 - 36.0 g/dL   RDW 14.8 11.5 - 15.5 %   Platelets 303 150 - 400 K/uL  Glucose, capillary     Status: Abnormal   Collection Time: 01/14/17  7:47 AM  Result Value Ref Range   Glucose-Capillary 321 (H) 65 - 99 mg/dL  Glucose, capillary     Status: Abnormal   Collection Time: 01/14/17 11:26 AM  Result Value Ref Range   Glucose-Capillary 282 (H) 65 - 99 mg/dL  Glucose, capillary     Status: Abnormal   Collection Time: 01/14/17  4:26 PM  Result Value Ref Range   Glucose-Capillary 142 (H) 65 - 99 mg/dL  Glucose, capillary     Status: Abnormal   Collection Time: 01/14/17  8:07 PM  Result Value Ref Range    Glucose-Capillary 143 (H) 65 - 99 mg/dL   Comment 1 Notify RN    Comment 2 Document in Chart   Glucose, capillary     Status: None   Collection Time: 01/14/17  9:19 PM  Result Value Ref Range   Glucose-Capillary 79 65 - 99 mg/dL  Glucose, capillary     Status: Abnormal   Collection Time: 01/15/17 12:06 AM  Result Value Ref Range   Glucose-Capillary 210 (H) 65 - 99 mg/dL   Comment 1 Notify RN    Comment 2 Document in Chart   CBC     Status: Abnormal   Collection Time: 01/15/17  3:27 AM  Result Value Ref Range   WBC 9.6 4.0 - 10.5 K/uL   RBC 3.01 (L) 3.87 - 5.11 MIL/uL   Hemoglobin 8.6 (L) 12.0 - 15.0 g/dL   HCT 27.1 (L) 36.0 - 46.0 %   MCV 90.0 78.0 - 100.0 fL   MCH 28.6 26.0 - 34.0 pg   MCHC 31.7 30.0 - 36.0 g/dL   RDW 14.7 11.5 - 15.5 %   Platelets 321 150 - 400 K/uL  Glucose, capillary     Status: Abnormal   Collection Time: 01/15/17  4:09 AM  Result Value Ref Range   Glucose-Capillary 309 (H) 65 - 99 mg/dL   Comment 1 Notify RN    Comment 2 Document in Chart   Glucose, capillary     Status: Abnormal   Collection Time: 01/15/17  7:50 AM  Result Value Ref Range   Glucose-Capillary 354 (H) 65 - 99 mg/dL  Glucose, capillary     Status: Abnormal   Collection Time: 01/15/17 11:45 AM  Result Value Ref Range   Glucose-Capillary 269 (H) 65 - 99 mg/dL  Glucose, capillary     Status: Abnormal   Collection Time: 01/15/17  4:20 PM  Result Value Ref Range   Glucose-Capillary 121 (H) 65 - 99 mg/dL   Comment 1 Notify RN    Comment 2 Document in Chart     Blood Alcohol level:  Lab Results  Component Value Date   ETH <10 12/20/2016   ETH  12/09/2006    <5        LOWEST DETECTABLE LIMIT FOR SERUM ALCOHOL IS 11 mg/dL FOR MEDICAL PURPOSES ONLY    Musculoskeletal: Strength & Muscle Tone: Bilateral lower extremity weakness. Gait & Station: UTA since patient lying in bed. Patient leans: N/A  Psychiatric Specialty Exam: Physical Exam  Nursing note and vitals  reviewed. Constitutional: She is oriented to person, place, and time. She appears well-developed and well-nourished.  HENT:  Head: Normocephalic and atraumatic.  Neck: Normal range of motion.  Respiratory: Effort normal.  Musculoskeletal: Normal range of motion.  Neurological: She is alert and oriented to person, place, and time.  Skin: No rash noted.  Psychiatric: Her behavior is normal. Judgment and thought content normal.    Review of Systems  Constitutional: Negative for chills and fever.  Gastrointestinal: Positive for diarrhea. Negative for constipation, nausea and vomiting.    Blood pressure 140/63, pulse 80, temperature (!) 97.5 F (36.4 C), temperature source Oral, resp. rate 17, height _0  (1.626 m), weight 76.2 kg (167 lb 15.9 oz), SpO2 100 %.Body mass index is 28.84 kg/m.  General Appearance: Well Groomed, African American female with braided hair, corrective lenses and a hospital gown who is lying in bed. NAD.   Eye Contact:  Good  Speech:  Clear and Coherent and Normal Rate  Volume:  Normal  Mood:  Depressed  Affect:  Congruent and Depressed  Thought Process:  Goal Directed and Linear  Orientation:  Full (Time, Place, and Person)  Thought Content:  Logical  Suicidal Thoughts:  Yes.  without intent/plan  Homicidal Thoughts:  No  Memory:  Immediate;   Good Recent;   Good Remote;   Good   Judgement:  Fair  Insight:  Fair  Psychomotor Activity:  Normal  Concentration:  Concentration: Good and Attention Span: Good  Recall:  Good  Fund of Knowledge:  Good  Language:  Good  Akathisia:  No  Handed:  Right  AIMS (if indicated):   N/A  Assets:  Communication Skills Desire for Improvement  ADL's:  Intact  Cognition:  WNL  Sleep:   Poor    Assessment:  Davette Nugent is a 51 y.o. female who was admitted withacute MS from [facility] for plasma exchange treatment due to worsening vision that was not improved with Solu-Medrol.She has had improvement of MS symptoms  but continues to endorse SI with depressive symptoms that have [facility]since [facility] continues to warrant inpatient psychiatric hospitalization once she is medically cleared due to high risk of harm to self.   Treatment Plan Summary: -Continue bedside sitter due to ongoing SI and risk of harm to self.  -Continue Cymbalta 60 mg daily for depression.  -Continue Remeron 30 mg qhs for sleep, anxiety and depression.  -Patient continues to require inpatient psychiatric hospitalization following medical clearance given continued SI. -It appears that patient has several psychosocial stressors that are contributing to her depressive symptoms and ongoing SI such as lack of social support and stable housing. SW is also following the patient. It may be beneficial for her to be provided with resources for housing and/or additional supports to help with her care.    Faythe Dingwall, DO 01/15/2017, 5:49 PM

## 2017-01-15 NOTE — Progress Notes (Addendum)
Inpatient Diabetes Program Recommendations  AACE/ADA: New Consensus Statement on Inpatient Glycemic Control (2015)  Target Ranges:  Prepandial:   less than 140 mg/dL      Peak postprandial:   less than 180 mg/dL (1-2 hours)      Critically ill patients:  140 - 180 mg/dL   Results for CADANCE, SCHUMAN (MRN [id number]) as of 01/15/2017 11:02  Ref. Range 01/14/2017 07:47 01/14/2017 11:26 01/14/2017 16:26 01/14/2017 20:07 01/14/2017 21:19 01/15/2017 00:06 01/15/2017 04:09 01/15/2017 07:50  Glucose-Capillary Latest Ref Range: 65 - 99 mg/dL 476 (H) 546 (H) 503 (H) 143 (H) 79 210 (H) 309 (H) 354 (H)   Review of Glycemic Control  Diabetes history: DM1 Outpatient Diabetes medications: Lantus 15 units BID, Novolog 8 units TID with meals plus Novolog 0-15 units TID Current orders for Inpatient glycemic control: Lantus 25 units QHS, Novolog 0-9 units TID with meals, Novolog 0-5 units QHS, Novolog 10 units TID with meals  Inpatient Diabetes Program Recommendations:  Insulin - Basal: Patient did NOT receive Lantus last night (charted as patient refused). As a result glucose up to 351 mg/dl today. Please consider changing frequency of Lantus to 25 units daily (to start now).  Thanks, Orlando Penner, RN, MSN, CDE Diabetes Coordinator Inpatient Diabetes Program [phone] (Team Pager from 8am to 5pm)

## 2017-01-15 NOTE — Progress Notes (Signed)
Subjective:   No acute events overnight, she has continued to complain of lower extremity pain that is not relieved by her Lyrica, Cymbalta, and Tylenol.  She did not work with PT yesterday but is hoping to today.  She reports no changes in her vision or lower extremity weakness.  Objective:  Vital signs in last 24 hours: Vitals:   01/15/17 0100 01/15/17 0500 01/15/17 0934 01/15/17 1025  BP: (!) 130/55 132/60 (!) 184/84 (!) 179/80  Pulse: 80 82 86 84  Resp: 16 16 12 16   Temp: 98.6 F (37 C) 98.4 F (36.9 C) 98.2 F (36.8 C) 98.5 F (36.9 C)  TempSrc: Oral Oral Oral Oral  SpO2:   98% 100%  Weight:  167 lb 15.9 oz (76.2 kg)    Height:       General: Sitting in bed eating breakfast, no acute distress  CV: Systolic murmur, normal rate and rhythm Resp: Lungs clear, normal work of breathing Abd: Soft, non-tender, +BS  Extr: No swelling Neuro: Alert and oriented, Stable 4/5 LE strength  GU: Foley remains in place   Assessment/Plan:  Leukocytosis, Diaphoresis/Chills  On 11/14 patient became acutely ill appearing and her white count had increased to 20.  Due to concern for possible infection, she started on broad-spectrum antibiotics after blood and urine cultures were obtained. Central line removed.  Blood cultures are NGTD, urine culture negative though U/A with white blood cell clumps.   She has remained afebrile and hemodynamically stable and clinically appears improved without clear infection source. Abx discontinued 11/17 and she remains stable and well-appearing. Her WBC has trended down and is now normal.  -Monitor fever curve and sx   Bilateral Leg Pain Pt reports bilateral burning and aching leg pain, likely a result of her multiple sclerosis and diabetes. She is on both cymbalta, gabapentin was transitioned to lyrica, which are ideal for neuropathic type pain though this pain is difficult to control. She has received intermittent tramadol doses during the night. Will  continue tylenol as well.  --Increase Lyrica to 100 TID --Tramadol 50 mg q8hr prn    Multiple Sclerosis, potential NMO  Patient has history of multiple sclerosis with lower extremity weakness and bladder/bowel incontinence which worsened, also experienced new vision changes.  MRI showed new demyelinating lesions.  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  She received IV steroids x2 doses and plasmapheresis x 4. LP performed showed increased protein, NMO Ab negative, oligoclonal bands negative. Her vision and lower extremity weakness improved with plasmapheresis sessions.  Neurology consulted and followed the course of the admission.  Will need outpatient follow-up to initiate maintenance therapy.   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  She was expressing suicidal ideation on admission, no significant change in mood.  Psych consulted here, appreciate updated recommendations. SW working to find psychiatric placement following medical management.  --Continue Cymbalta 60 mg, Remeron 30 mg qhs  --Inpatient psych admission following medical stabilization  H/o Diabetes  CBGs have been variable throughout the course of admission with both hyper and hypoglycemia at various points depending on steroids, oral intake. Continue to monitor. 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 Foley placed and pt continued to have retention during voiding trials on two separate occassions. Foley currently in place.  Dispo: Anticipated discharge pending disposition issues.   Ginger CarneHarden, Jhayla Podgorski, MD 01/15/2017, 10:57 AM

## 2017-01-15 NOTE — Progress Notes (Signed)
Pt has remained safe without incident throughout the night.  Affect has slightly improved but remains unable to contract for safety.  Will continue to monitor for safety.

## 2017-01-15 NOTE — Progress Notes (Signed)
Physical Therapy Treatment Patient Details Name: Debra [name] MRN: [id number] DOB: 1965-11-26 Today's Date: 01/15/2017    History of Present Illness Pt is a 51 y/o female admitted to [facility] secondary to MS exacerbation and major depressive disorder. Pt transferred to [facility] for insertion of R IJ catheter and plasmophoresis treatments. Pt also very depressed about recent decline in function and psychiatry following. Pt only completed 4/5 PLEX and was discontinued (possible infection acquired). PMH includes MS, HTN, DM with neuropathy, depression, heart murmur, and restless leg syndrome.     PT Comments    Pt progressing towards PT goals. Pt demonstrates sit-to-stand from elevated surface using Stedy with min assist 2+ and min guard at knees +1. Pt tolerates 2 minutes of standing in Stedy followed by seated rest break and 1 minute of standing. Pt demonstrates poor control of descent from stand to sit at EOB, most likely due to muscle fatigue and poor quad control. Pt remains good candidate for skilled PT in order to continue improving strength, tolerance to activity, mobility, and balance and increase level of independence.    Follow Up Recommendations  SNF;Supervision/Assistance - 24 hour     Equipment Recommendations  None recommended by PT    Recommendations for Other Services       Precautions / Restrictions Precautions Precautions: Fall Precaution Comments: suicide precautions  Restrictions Weight Bearing Restrictions: No    Mobility  Bed Mobility Overal bed mobility: Needs Assistance Bed Mobility: Sidelying to Sit;Rolling Rolling: Modified independent (Device/Increase time) Sidelying to sit: Modified independent (Device/Increase time);HOB elevated       General bed mobility comments: Pt requires increased time and use of railing for bed mobility.  Transfers Overall transfer level: Needs assistance   Transfers: Sit to/from Stand Sit to Stand: +2 physical  assistance;Min assist;From elevated surface(+1 min assist at knees)         General transfer comment: sit-to-stand x1 from elevated bed surface and x1 from Stedy buttock flap surface. +2 min assist and +1 min guard at knees. VCs to pull up with UEs on Stedy. Able to remain standing in [place] for 2-min and then 1-min.   Ambulation/Gait             General Gait Details: unable due to bilateral leg weakness   Stairs            Wheelchair Mobility    Modified Rankin (Stroke Patients Only)       Balance Overall balance assessment: Needs assistance Sitting-balance support: Feet unsupported;No upper extremity supported Sitting balance-Leahy Scale: Good Sitting balance - Comments: Pt able to reach outside BOS while maintaining sitting balance. Pt toelrated moderate perturbations while seated EOB with both feet unsupported.    Standing balance support: Bilateral upper extremity supported Standing balance-Leahy Scale: Poor Standing balance comment: Pt stands heavily reliant on UEs on Stedy with knees blocked +1 and min assist +1.                             Cognition Arousal/Alertness: Awake/alert Behavior During Therapy: WFL for tasks assessed/performed Overall Cognitive Status: Within Functional Limits for tasks assessed                                        Exercises Other Exercises Other Exercises: reaching outside BOS x4 each quadrant    General Comments General comments (  skin integrity, edema, etc.): Pt c/o slight dizziness when seated EOB towards end of session.       Pertinent Vitals/Pain Pain Assessment: No/denies pain    Home Living                      Prior Function            PT Goals (current goals can now be found in the care plan section) Progress towards PT goals: Progressing toward goals    Frequency    Min 2X/week      PT Plan Current plan remains appropriate    Co-evaluation               AM-PAC PT "6 Clicks" Daily Activity  Outcome Measure  Difficulty turning over in bed (including adjusting bedclothes, sheets and blankets)?: A Little Difficulty moving from lying on back to sitting on the side of the bed? : A Little Difficulty sitting down on and standing up from a chair with arms (e.g., wheelchair, bedside commode, etc,.)?: Unable Help needed moving to and from a bed to chair (including a wheelchair)?: A Lot Help needed walking in hospital room?: Total Help needed climbing 3-5 steps with a railing? : Total 6 Click Score: 11    End of Session Equipment Utilized During Treatment: Gait belt Activity Tolerance: Patient tolerated treatment well;Patient limited by fatigue Patient left: in bed;with call bell/phone within reach;with nursing/sitter in room Nurse Communication: Mobility status PT Visit Diagnosis: Unsteadiness on feet (R26.81);Muscle weakness (generalized) (M62.81);Other symptoms and signs involving the nervous system (R29.898);Difficulty in walking, not elsewhere classified (R26.2)     Time: 4742-5956 PT Time Calculation (min) (ACUTE ONLY): 26 min  Charges:  $Therapeutic Activity: 8-22 mins $Neuromuscular Re-education: 8-22 mins                    G Codes:       Reina Fuse, SPT   Reina Fuse 01/15/2017, 12:42 PM

## 2017-01-16 ENCOUNTER — Inpatient Hospital Stay (HOSPITAL_COMMUNITY): Payer: Medicaid Other

## 2017-01-16 LAB — GLUCOSE, CAPILLARY
GLUCOSE-CAPILLARY: 182 mg/dL — AB (ref 65–99)
Glucose-Capillary: 312 mg/dL — ABNORMAL HIGH (ref 65–99)
Glucose-Capillary: 267 mg/dL — ABNORMAL HIGH (ref 65–99)
Glucose-Capillary: 125 mg/dL — ABNORMAL HIGH (ref 65–99)

## 2017-01-16 MED ORDER — INSULIN ASPART 100 UNIT/ML ~~LOC~~ SOLN
12.0000 [IU] | Freq: Three times a day (TID) | SUBCUTANEOUS | Status: DC
  Administered 2017-01-16 – 2017-01-23 (×19): 12 [IU] via SUBCUTANEOUS

## 2017-01-16 MED ORDER — LOPERAMIDE HCL 2 MG PO CAPS
2.0000 mg | ORAL_CAPSULE | Freq: Once | ORAL | Status: AC
  Administered 2017-01-16: 2 mg via ORAL
  Filled 2017-01-16: qty 1

## 2017-01-16 MED ORDER — CYCLOBENZAPRINE HCL 10 MG PO TABS
5.0000 mg | ORAL_TABLET | Freq: Three times a day (TID) | ORAL | Status: DC | PRN
Start: 2017-01-16 — End: 2017-01-17
  Administered 2017-01-16: 5 mg via ORAL
  Filled 2017-01-16: qty 1

## 2017-01-16 MED ORDER — INSULIN GLARGINE 100 UNIT/ML ~~LOC~~ SOLN
27.0000 [IU] | Freq: Every day | SUBCUTANEOUS | Status: DC
  Administered 2017-01-16 – 2017-01-21 (×6): 27 [IU] via SUBCUTANEOUS
  Filled 2017-01-16 (×6): qty 0.27

## 2017-01-16 NOTE — Plan of Care (Signed)
  Education: Knowledge of General Education information will improve 01/16/2017 0424 - Progressing by Olena Mater, RN Note POC reviewed with pt.; states that she still wants to harm herself; sitter at bedside.

## 2017-01-16 NOTE — Progress Notes (Signed)
   Subjective:   No acute events overnight, pt continues to have LE pain though now reports spasms that are also causing pain, did not experience relief with tramadol yesterday. Also notes loose stools yesterday evening and this morning, no abdominal pain.   Objective:  Vital signs in last 24 hours: Vitals:   01/15/17 2149 01/16/17 0218 01/16/17 0605 01/16/17 0923  BP: 140/65 (!) 148/62 (!) 160/78 (!) 198/85  Pulse: 79 83 83 78  Resp: 17 16 16 18   Temp: 98.6 F (37 C) 98.6 F (37 C) 98.6 F (37 C) 98.2 F (36.8 C)  TempSrc: Oral Oral Oral Oral  SpO2: 95% 99% 98% 99%  Weight:   163 lb 2.3 oz (74 kg)   Height:       General: Resting in bed comfortably, no acute distress  CV: Systolic murmur, normal rate and rhythm Resp: Lungs clear, normal work of breathing Abd: Soft, non-tender, question increased distension compared to prior, +BS  Extr: No swelling Neuro: Alert and oriented GU: Foley remains in place   Assessment/Plan:  Bilateral Leg Pain Pt reports bilateral burning and aching leg pain, likely a result of her multiple sclerosis and diabetes. She is on both cymbalta, gabapentin was transitioned to lyrica, which are ideal for neuropathic type pain though this pain is difficult to control. Will continue tylenol as well.  --Cont Lyrica to 100 TID --Cyclobenzaprine 5 mg TID prn   Loose Stools Pt complaining of loose stools. After constipation earlier in hospitalization, she has been receiving daily colace likely contributing to her loose stools. She had negative c diff earlier in the admission, is afebrile and stable, will defer re-checking and continue to monitor.  --Imodium --KUB   Multiple Sclerosis, potential NMO  Patient initially admitted for MS flare with concern for NMO and has completed plasmapheresis with improvement in deficits. Neurology consulted and followed for the course of MS tx.  Will need outpatient follow-up to initiate maintenance therapy.  Major  Depressive Disorder Patient has a history of severe depression with psychotic features which is required inpatient psychiatric admission. Continues to express suicidal ideation.  Psych consulted here, appreciate updated recommendations. SW working to find psychiatric placement following medical management.  --Continue Cymbalta 60 mg, Remeron 30 mg qhs  --Inpatient psych admission following medical stabilization  H/o Diabetes  CBGs have been variable throughout the course of admission with both hyper and hypoglycemia at various points depending on steroids, oral intake. Has increased with consistent hyperglycemia over last 24-48 hrs, pt also appears to have refused a dose of Lantus, however. Diabetes coordinator has followed pt.   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 Foley placed and pt continued to have retention during voiding trials on two separate occassions. Foley currently in place.       Dispo: Anticipated discharge pending disposition issues.   [name], MD 01/16/2017, 11:00 AM

## 2017-01-16 NOTE — Progress Notes (Signed)
Inpatient Diabetes Program Recommendations  AACE/ADA: New Consensus Statement on Inpatient Glycemic Control (2015)  Target Ranges:  Prepandial:   less than 140 mg/dL      Peak postprandial:   less than 180 mg/dL (1-2 hours)      Critically ill patients:  140 - 180 mg/dL   Lab Results  Component Value Date   GLUCAP 312 (H) 01/16/2017   HGBA1C 11.4 (H) 12/23/2016    Review of Glycemic Control  Results for Debra [name], Debra [name] (MRN [id number]) as of 01/16/2017 09:21  Ref. Range 01/15/2017 07:50 01/15/2017 11:45 01/15/2017 16:20 01/15/2017 21:53 01/16/2017 08:01  Glucose-Capillary Latest Ref Range: 65 - 99 mg/dL 505 (H) 397 (H) 673 (H) 153 (H) 312 (H)   Diabetes history: DM1  Outpatient Diabetes medications: Lantus 15 units BID, Novolog 8 units TID with meals plus Novolog 0-15 units TID  Current orders for Inpatient glycemic control: Lantus 25 units QHS, Novolog 0-9 units TID with meals, Novolog 0-5 units QHS, Novolog 10 units TID with meals  Inpatient Diabetes Program Recommendations: Consider increasing Lantus insulin to 27 units qhs (fasting blood sugar 312mg /dl), consider increasing mealtime Novolog to 12 units tid (only to be given if patient eats >50%)- note post prandial blood sugars elevated.   Susette Racer, RN, BA, MHA, CDE Diabetes Coordinator Inpatient Diabetes Program  [phone] (Team Pager) [phone] [facility] Office) 01/16/2017 9:23 AM

## 2017-01-16 NOTE — Care Management Note (Signed)
Case Management Note  Patient Details  Name: Debra [name] MRN: [id number] Date of Birth: 1965/06/11  Subjective/Objective:                    Action/Plan: Plan is for [facility] inpatient psychiatric hospital. CM following.  Expected Discharge Date:                  Expected Discharge Plan:  IP Rehab Facility  In-House Referral:  Clinical Social Work  Discharge planning Services  CM Consult  Post Acute Care Choice:    Choice offered to:     DME Arranged:    DME Agency:     HH Arranged:    HH Agency:     Status of Service:  In process, will continue to follow  If discussed at Long Length of Stay Meetings, dates discussed:    Additional Comments:  Kermit Balo, RN 01/16/2017, 11:44 AM

## 2017-01-16 NOTE — Progress Notes (Signed)
   01/16/17 1700  Clinical Encounter Type  Visited With Patient  Visit Type Follow-up  Consult/Referral To Chaplain  Spiritual Encounters  Spiritual Needs Emotional  Chaplain visited with PT and her disposition seemed positive.  She smiled and talked more about recovery and making progress with therapy everyday.

## 2017-01-16 NOTE — Progress Notes (Addendum)
CSW following to facilitate discharge planning. CSW updated by Medical Director that patient is no longer on the [facility] list; the admissions team states that they cannot find the referral. CSW completed application and faxed over again for admission to [facility].   CSW called to discuss referral over the phone and confirm that it has been received. CSW has called three times and been asked to call back in 15 minutes every time. CSW will continue to call to discuss referral and confirm receipt.  Blenda Nicely, LCSW Clinical Social Worker [phone]   UPDATE 4:15 PM:  CSW able to provide verbal referral and acceptance of the information with Vonna Kotyk in admissions at [facility]. Admissions will call back after clinical review.  Blenda Nicely, [place] Clinical Social Worker [phone]   UPDATE 5:15 PM:  CSW contacted by Britta Mccreedy at [facility] to discuss referral and patient's mobility status. CSW to fax copies of PT and OT evaluations and progress notes for [facility] to review tomorrow.  Blenda Nicely, [place] Clinical Social Worker [phone]

## 2017-01-17 LAB — GLUCOSE, CAPILLARY
GLUCOSE-CAPILLARY: 187 mg/dL — AB (ref 65–99)
GLUCOSE-CAPILLARY: 140 mg/dL — AB (ref 65–99)
GLUCOSE-CAPILLARY: 115 mg/dL — AB (ref 65–99)
Glucose-Capillary: 90 mg/dL (ref 65–99)

## 2017-01-17 MED ORDER — BACLOFEN 10 MG PO TABS
5.0000 mg | ORAL_TABLET | Freq: Three times a day (TID) | ORAL | Status: DC
  Administered 2017-01-17 – 2017-02-14 (×87): 5 mg via ORAL
  Filled 2017-01-17 (×88): qty 1

## 2017-01-17 NOTE — Plan of Care (Addendum)
  Progressing Pain Managment: General experience of comfort will improve 01/17/2017 0445 - Progressing by Burtis Junes, RN Note Patient given tylenol and tramadol prn and scheduled lyrica for BLE pain.  Patient rested overnight without further request for pain medication Skin Integrity: Risk for impaired skin integrity will decrease 01/17/2017 0445 - Progressing by Burtis Junes, RN    Patient loss piv access previous shift and requested to go without piv access since not taking any iv meds. Ok per team.

## 2017-01-17 NOTE — Progress Notes (Addendum)
CSW following to facilitate discharge. CSW sent copies of PT and OT evaluations and progress notes to [facility] admissions for review. CSW contacted admissions office to confirm receipt. CSW talked with Vonna Kotyk, who indicated that the fax was not received. CSW faxed information again, including successful transfer sheet from the initial fax, and confirmed with Coralee North that information was received the second time.  [facility] Admissions will contact CSW back after medical review of the patient. CSW to continue to follow.  Blenda Nicely, [place] Clinical Social Worker [phone]   UPDATE 2:43 PM:  CSW contacted by Chrissie Noa in [facility] admissions to ask about the patient's antibiotic. Chrissie Noa indicated that the medical director was reviewing referral and the notes say that she is on a high dose of IV antibiotics, and they need to know what for and how long she's on them, because the notes are not clear. CSW was not sitting at a computer to review notes and provide information. Chrissie Noa indicated that the information did not have to be received today and could be sent on Friday to clarify.   CSW will continue to follow.  Blenda Nicely, [place] Clinical Social Worker [phone]

## 2017-01-17 NOTE — Progress Notes (Signed)
Occupational Therapy Treatment Patient Details Name: Debra [name] MRN: [id number] DOB: Feb 19, 1966 Today's Date: 01/17/2017    History of present illness Pt is a 51 y/o female admitted to [facility] secondary to MS exacerbation and major depressive disorder. Pt transferred to [facility] for insertion of R IJ catheter and plasmophoresis treatments. Pt also very depressed about recent decline in function and psychiatry following. Pt only completed 4/5 PLEX and was discontinued (possible infection acquired). PMH includes MS, HTN, DM with neuropathy, depression, heart murmur, and restless leg syndrome.    OT comments  Pt just returned to bed with RN staff; declines transfers but agreeable to EOB activity. Tolerated seated pushups x5, lateral scooting R and L at EOB, and reaching activities. Pt continues to demonstrate significant bil LE weakness but improved trunk control this session. D/c plan updated to SNF for follow up. Will continue to follow acutely.   Follow Up Recommendations  SNF;Supervision/Assistance - 24 hour    Equipment Recommendations  3 in 1 bedside commode    Recommendations for Other Services      Precautions / Restrictions Precautions Precautions: Fall Restrictions Weight Bearing Restrictions: No       Mobility Bed Mobility Overal bed mobility: Modified Independent Bed Mobility: Supine to Sit;Sit to Supine     Supine to sit: Modified independent (Device/Increase time) Sit to supine: Modified independent (Device/Increase time)      Transfers                      Balance Overall balance assessment: Needs assistance Sitting-balance support: Feet supported;No upper extremity supported Sitting balance-Leahy Scale: Fair                                     ADL either performed or assessed with clinical judgement   ADL                                               Vision       Perception     Praxis      Cognition  Arousal/Alertness: Awake/alert Behavior During Therapy: WFL for tasks assessed/performed Overall Cognitive Status: Within Functional Limits for tasks assessed                                          Exercises Exercises: Other exercises Other Exercises Other Exercises: seated pushups x5 Other Exercises: lateral scooting x5 L, x5 R Other Exercises: reaching outside BOS x10 each front, R, L in sitting   Shoulder Instructions       General Comments      Pertinent Vitals/ Pain       Pain Assessment: Faces Faces Pain Scale: Hurts even more Pain Location: bil LEs Pain Descriptors / Indicators: Aching Pain Intervention(s): Limited activity within patient's tolerance;Monitored during session  Home Living                                          Prior Functioning/Environment              Frequency  Min 3X/week  Progress Toward Goals  OT Goals(current goals can now be found in the care plan section)  Progress towards OT goals: Progressing toward goals  Acute Rehab OT Goals Patient Stated Goal: get better OT Goal Formulation: With patient  Plan Discharge plan needs to be updated    Co-evaluation                 AM-PAC PT "6 Clicks" Daily Activity     Outcome Measure   Help from another person eating meals?: A Little Help from another person taking care of personal grooming?: A Little Help from another person toileting, which includes using toliet, bedpan, or urinal?: A Lot Help from another person bathing (including washing, rinsing, drying)?: A Lot Help from another person to put on and taking off regular upper body clothing?: A Little Help from another person to put on and taking off regular lower body clothing?: A Lot 6 Click Score: 15    End of Session Equipment Utilized During Treatment: Gait belt  OT Visit Diagnosis: Other abnormalities of gait and mobility (R26.89);Muscle weakness (generalized)  (M62.81);Low vision, both eyes (H54.2)   Activity Tolerance Patient tolerated treatment well   Patient Left in bed;with call bell/phone within reach;with nursing/sitter in room   Nurse Communication          Time: 4163-8453 OT Time Calculation (min): 14 min  Charges: OT General Charges $OT Visit: 1 Visit OT Treatments $Therapeutic Activity: 8-22 mins  Maxi Rodas A. Brett Albino, M.S., OTR/L Pager: [phone]   Gaye Alken 01/17/2017, 5:41 PM

## 2017-01-17 NOTE — Progress Notes (Signed)
   Subjective:   No acute events overnight, she continues to complain of leg pain and spasm which was not relieved with Flexeril yesterday.  She states her loose stools have resolved but she has not yet had a normal bowel movement.  Her vision and lower extremity weakness remains stable, she did not work with PT yesterday.  Objective:  Vital signs in last 24 hours: Vitals:   01/16/17 1404 01/16/17 2100 01/17/17 0214 01/17/17 0552  BP: (!) 176/79 (!) 144/71 (!) 146/76 (!) 178/85  Pulse: 76 76 78 78  Resp: 20 20 18 18   Temp: 98.3 F (36.8 C)  98.5 F (36.9 C) 98.7 F (37.1 C)  TempSrc: Oral  Oral Oral  SpO2: 100% 100% 95% 97%  Weight:    166 lb 14.2 oz (75.7 kg)  Height:       General: Resting in bed comfortably, no acute distress  CV: Systolic murmur, normal rate and rhythm Resp: Lungs clear, normal work of breathing Abd: Soft, non-tender, +BS  Extr: No swelling Neuro: Alert and oriented, Stable 4/5 LE strength with R>L  GU: Foley remains in place   Assessment/Plan:  Bilateral Leg Pain Pt reports bilateral burning and aching leg pain, likely a result of her multiple sclerosis and diabetes. She is on both cymbalta, gabapentin was transitioned to lyrica, which are ideal for neuropathic type pain though this pain is difficult to control. Also complaining of spasm type pain. Will continue tylenol as well.  --Cont Lyrica 100 TID --DC flexeril, Trial baclofen 5 mg PO TID   Loose Stools, resolved Pt complained of loose stools yesterday. Given her stability, recent negative C diff, no abdominal pain, re-checking C diff was not pursued and imodium x1 administered with resolution, KUB negative. Will continue to monitor and attempt to balance loose stools v constipation.   Multiple Sclerosis, potential NMO  Patient initially admitted for MS flare with concern for NMO and has completed plasmapheresis with improvement in deficits. Neurology consulted and followed for the course of MS tx.   Will need outpatient follow-up to initiate maintenance therapy.  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 Continues to express suicidal ideation.  Psych consulted here, appreciate updated recommendations. SW working to find psychiatric placement following medical management.  --Continue Cymbalta 60 mg, Remeron 30 mg qhs  --Inpatient psych admission following medical stabilization  H/o Diabetes  CBGs have been variable throughout the course of admission with both hyper and hypoglycemia at various points depending on steroids, oral intake.  After recent changes, blood glucose is been improved and stable. --Cont Lantus 27 U qhs, Novolog 12 U TID AC --Cont SSI, night coverage     Dispo: Anticipated discharge pending disposition issues.   [name], MD 01/17/2017, 9:46 AM

## 2017-01-18 LAB — GLUCOSE, CAPILLARY
GLUCOSE-CAPILLARY: 103 mg/dL — AB (ref 65–99)
GLUCOSE-CAPILLARY: 152 mg/dL — AB (ref 65–99)
Glucose-Capillary: 251 mg/dL — ABNORMAL HIGH (ref 65–99)

## 2017-01-18 NOTE — Progress Notes (Signed)
   Subjective:  No acute events overnight.  She states her leg pain has improved today with decreased spasms.  She also reports improvement in her bowel movements. Vision and LE strength remains stable. She worked with OT yesterday and hopes to work with PT today.  Continues to report suicidal ideation  Objective:  Vital signs in last 24 hours: Vitals:   01/17/17 1405 01/17/17 1820 01/18/17 0500 01/18/17 0600  BP: (!) 143/65 (!) 155/71  (!) 187/87  Pulse: 76 (!) 11  80  Resp: 18 16  16   Temp: 98.7 F (37.1 C) 98.4 F (36.9 C)  98.7 F (37.1 C)  TempSrc: Oral Oral  Oral  SpO2: 97% 96%  100%  Weight:   165 lb 5.5 oz (75 kg)   Height:       General: Resting in bed comfortably, no acute distress  CV: Systolic murmur, normal rate and rhythm Resp: Lungs clear, normal work of breathing Abd: Soft, non-tender, +BS  Extr: No swelling Neuro: Alert and oriented GU: Foley remains in place   Assessment/Plan:  Bilateral Leg Pain Pt reports bilateral burning and aching leg pain, likely a result of her multiple sclerosis and diabetes. She is on both cymbalta, gabapentin was transitioned to lyrica, which are ideal for neuropathic type pain though this pain is difficult to control. Also complaining of spasm type pain. Will continue tylenol as well, tramadol available prn.  --Cont Lyrica 100 TID --Cont baclofen 5 mg PO TID   Multiple Sclerosis, potential NMO  Patient initially admitted for MS flare with concern for NMO and has completed plasmapheresis with improvement in deficits. Neurology consulted and followed for the course of MS tx.  Will need outpatient follow-up to initiate maintenance therapy.  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 Continues to express suicidal ideation.  Psych consulted here, appreciate updated recommendations. SW working to find psychiatric placement following  medical management.  --Continue Cymbalta 60 mg, Remeron 30 mg qhs  --Inpatient psych admission following medical stabilization  H/o Diabetes  CBGs have been variable throughout the course of admission with both hyper and hypoglycemia at various points depending on steroids, oral intake.  After recent changes, blood glucose has been improved and stable. --Cont Lantus 27 U qhs, Novolog 12 U TID AC --Cont SSI, night coverage     Dispo: Anticipated discharge pending disposition issues.   [name], MD 01/18/2017, 8:30 AM

## 2017-01-18 NOTE — Progress Notes (Signed)
Patient without IV. Paged service. Dr Anthonette Legato returned call. Is aware patient does not have an IV, no need to place one.

## 2017-01-19 LAB — GLUCOSE, CAPILLARY
Glucose-Capillary: 182 mg/dL — ABNORMAL HIGH (ref 65–99)
Glucose-Capillary: 92 mg/dL (ref 65–99)
Glucose-Capillary: 94 mg/dL (ref 65–99)
Glucose-Capillary: 93 mg/dL (ref 65–99)

## 2017-01-19 NOTE — Progress Notes (Signed)
Physical Therapy Treatment Patient Details Name: Debra [name] MRN: [id number] DOB: 11/20/1965 Today's Date: 01/19/2017    History of Present Illness Pt is a 51 y/o female admitted to [facility] secondary to MS exacerbation and major depressive disorder. Pt transferred to [facility] for insertion of R IJ catheter and plasmophoresis treatments. Pt also very depressed about recent decline in function and psychiatry following. Pt only completed 4/5 PLEX and was discontinued (possible infection acquired). PMH includes MS, HTN, DM with neuropathy, depression, heart murmur, and restless leg syndrome.     PT Comments    Pt supine in bed on arrival and found with stool incontinence.  Pt performed multiple transfer activities during session and required increased assistance.  Attempted bridging in supine and patient unable to contract muscles to move through the exercise.  Pt appears to be regressing in her mobility and required increased assistance and increased time to perform bed mobility during session.  Plan for SNF remains appropriate.  Encouraged patient to get OOB with staff to a wheel chair for LE propulsion to encourage LE use.     Follow Up Recommendations  SNF;Supervision/Assistance - 24 hour     Equipment Recommendations  None recommended by PT    Recommendations for Other Services Rehab consult     Precautions / Restrictions Precautions Precautions: Fall Precaution Comments: suicide precautions  Restrictions Weight Bearing Restrictions: No    Mobility  Bed Mobility Overal bed mobility: Needs Assistance Bed Mobility: Rolling;Supine to Sit;Sit to Supine Rolling: Supervision   Supine to sit: Min assist Sit to supine: Min assist   General bed mobility comments: Pt required increased time and use of bed rails for all mobility.  Cues for foot and hand placement to perform rolling, assist for trunk to achieve sitting from supine and assistance to lift B LEs back into bed against gravity  to return to supine. Pt presents with decreased core and LE strength during session.    Transfers Overall transfer level: Needs assistance Equipment used: (sara stedy) Transfers: Sit to/from Stand Sit to Stand: Mod assist;+2 physical assistance         General transfer comment: Pt performed sit to stand from bed and from sara stedy plates ( elevated surface ).  Pt required mod assist for boosting into standing with faciliation to achieve hip and trunk extension.  Pt unable to stand for more than 10 sec.   Reports tightness in B HS R>L.    Ambulation/Gait                 Stairs            Wheelchair Mobility    Modified Rankin (Stroke Patients Only)       Balance     Sitting balance-Leahy Scale: Poor Sitting balance - Comments: Pt with LOB posterior when lifting feet to place B feet on stedy foot plate.       Standing balance-Leahy Scale: Zero Standing balance comment: Pt stands heavily reliant on UEs on Stedy with knees blocked mod +2 and unable to achieve full standing.                              Cognition Arousal/Alertness: Awake/alert Behavior During Therapy: WFL for tasks assessed/performed Overall Cognitive Status: Within Functional Limits for tasks assessed  Exercises General Exercises - Lower Extremity Heel Slides: AAROM;PROM;Right;Supine;10 reps Hip ABduction/ADduction: AAROM;PROM;Right;10 reps;Supine Other Exercises Other Exercises: R HS/HC stretch 2x15 secs.      General Comments        Pertinent Vitals/Pain Pain Assessment: 0-10 Pain Score: 4  Pain Location: bil LEs Pain Descriptors / Indicators: Tightness Pain Intervention(s): Monitored during session;Repositioned    Home Living                      Prior Function            PT Goals (current goals can now be found in the care plan section) Acute Rehab PT Goals Patient Stated Goal: get  better Potential to Achieve Goals: Fair Progress towards PT goals: Not progressing toward goals - comment    Frequency    Min 2X/week      PT Plan Current plan remains appropriate    Co-evaluation PT/OT/SLP Co-Evaluation/Treatment: Yes Reason for Co-Treatment: Complexity of the patient's impairments (multi-system involvement);Necessary to address cognition/behavior during functional activity;For patient/therapist safety PT goals addressed during session: Mobility/safety with mobility;Balance OT goals addressed during session: ADL's and self-care;Proper use of Adaptive equipment and DME;Strengthening/ROM      AM-PAC PT "6 Clicks" Daily Activity  Outcome Measure  Difficulty turning over in bed (including adjusting bedclothes, sheets and blankets)?: Unable Difficulty moving from lying on back to sitting on the side of the bed? : Unable Difficulty sitting down on and standing up from a chair with arms (e.g., wheelchair, bedside commode, etc,.)?: Unable Help needed moving to and from a bed to chair (including a wheelchair)?: Total Help needed walking in hospital room?: Total Help needed climbing 3-5 steps with a railing? : Total 6 Click Score: 6    End of Session   Activity Tolerance: Patient tolerated treatment well;Patient limited by fatigue Patient left: in bed;with call bell/phone within reach;with nursing/sitter in room Nurse Communication: Mobility status PT Visit Diagnosis: Unsteadiness on feet (R26.81);Muscle weakness (generalized) (M62.81);Other symptoms and signs involving the nervous system (R29.898);Difficulty in walking, not elsewhere classified (R26.2)     Time: 6237-6283 PT Time Calculation (min) (ACUTE ONLY): 25 min  Charges:  $Therapeutic Activity: 8-22 mins                    G CodesJoycelyn Rua, PTA pager [phone]    Florestine Avers 01/19/2017, 4:32 PM

## 2017-01-19 NOTE — Progress Notes (Signed)
Occupational Therapy Treatment Patient Details Name: Debra LacrosseCheryl Lynette Sandler MRN: [id number] DOB: 03-29-1965 Today's Date: 01/19/2017    History of present illness Pt is a [age] y/o female [facility] secondary to MS exacerbation and major depressive disorder. [facility] for insertion of R IJ catheter and plasmophoresis treatments. Pt also very depressed about recent decline in function and psychiatry following. Pt only completed 4/5 PLEX and was discontinued (possible infection acquired). PMH includes MS, HTN, DM with neuropathy, depression, heart murmur, and restless leg syndrome.    OT comments  Pt with increased bil LE weakness and pain/hamgstring "tightness" this session. She required mod assist +2 for sit to stand in [place]. Required total assist for peri care following incontinent BM. D/c plan remains appropriate. Will continue to follow acutely.   Follow Up Recommendations  SNF;Supervision/Assistance - 24 hour    Equipment Recommendations  3 in 1 bedside commode    Recommendations for Other Services      Precautions / Restrictions Precautions Precautions: Fall Precaution Comments: suicide precautions  Restrictions Weight Bearing Restrictions: No       Mobility Bed Mobility Overal bed mobility: Needs Assistance Bed Mobility: Rolling;Supine to Sit;Sit to Supine Rolling: Supervision   Supine to sit: Min assist Sit to supine: Min assist   General bed mobility comments: Pt required increased time and use of bed rails for all mobility.  Cues for foot and hand placement to perform rolling, assist for trunk to achieve sitting from supine and assistance to lift B LEs back into bed against gravity to return to supine. Pt presents with decreased core and LE strength during session.    Transfers Overall transfer level: Needs assistance Equipment used: (sara stedy) Transfers: Sit to/from Stand Sit to Stand: Mod assist;+2 physical assistance         General transfer  comment: Pt performed sit to stand from bed and from sara stedy plates ( elevated surface ).  Pt required mod assist for boosting into standing with faciliation to achieve hip and trunk extension.  Pt unable to stand for more than 10 sec.   Reports tightness in B HS R>L.      Balance Overall balance assessment: Needs assistance   Sitting balance-Leahy Scale: Poor Sitting balance - Comments: Pt with LOB posterior when lifting feet to place B feet on stedy foot plate.       Standing balance-Leahy Scale: Zero Standing balance comment: Pt stands heavily reliant on UEs on Stedy with knees blocked mod +2 and unable to achieve full standing.                             ADL either performed or assessed with clinical judgement   ADL Overall ADL's : Needs assistance/impaired     Grooming: Min guard;Standing;Wash/dry hands Grooming Details (indicate cue type and reason): in Stedy frame                     Toileting- Clothing Manipulation and Hygiene: Total assistance;Sit to/from stand Toileting - Clothing Manipulation Details (indicate cue type and reason): for peri care following incontinent BM     Functional mobility during ADLs: Moderate assistance;+2 for physical assistance(in [place])       Vision       Perception     Praxis      Cognition Arousal/Alertness: Awake/alert Behavior During Therapy: WFL for tasks assessed/performed Overall Cognitive Status: Within Functional Limits for tasks assessed  Exercises Exercises: Other exercises General Exercises - Lower Extremity Heel Slides: AAROM;PROM;Right;Supine;10 reps Hip ABduction/ADduction: AAROM;PROM;Right;10 reps;Supine Other Exercises Other Exercises: bed pushups x10   Shoulder Instructions       General Comments      Pertinent Vitals/ Pain       Pain Assessment: Faces Pain Score: 4  Faces Pain Scale: Hurts little more Pain Location: bil  LEs Pain Descriptors / Indicators: Tightness Pain Intervention(s): Monitored during session;Repositioned  Home Living                                          Prior Functioning/Environment              Frequency  Min 2X/week        Progress Toward Goals  OT Goals(current goals can now be found in the care plan section)  Progress towards OT goals: Not progressing toward goals - comment(increased weakness this session)  Acute Rehab OT Goals Patient Stated Goal: get better OT Goal Formulation: With patient  Plan Discharge plan remains appropriate;Frequency needs to be updated    Co-evaluation    PT/OT/SLP Co-Evaluation/Treatment: Yes Reason for Co-Treatment: Complexity of the patient's impairments (multi-system involvement);Necessary to address cognition/behavior during functional activity;For patient/therapist safety PT goals addressed during session: Mobility/safety with mobility;Balance OT goals addressed during session: ADL's and self-care      AM-PAC PT "6 Clicks" Daily Activity     Outcome Measure   Help from another person eating meals?: A Little Help from another person taking care of personal grooming?: A Little Help from another person toileting, which includes using toliet, bedpan, or urinal?: Total Help from another person bathing (including washing, rinsing, drying)?: A Lot Help from another person to put on and taking off regular upper body clothing?: A Little Help from another person to put on and taking off regular lower body clothing?: A Lot 6 Click Score: 14    End of Session Equipment Utilized During Treatment: Other (comment)(Stedy)  OT Visit Diagnosis: Other abnormalities of gait and mobility (R26.89);Muscle weakness (generalized) (M62.81);Low vision, both eyes (H54.2)   Activity Tolerance Patient limited by pain   Patient Left in bed;with call bell/phone within reach;with nursing/sitter in room   Nurse Communication  Mobility status        Time: 2725-3664 OT Time Calculation (min): 29 min  Charges: OT General Charges $OT Visit: 1 Visit OT Treatments $Self Care/Home Management : 8-22 mins  Ifeoluwa Bartz A. Brett Albino, M.S., OTR/L Pager: [phone]   Gaye Alken 01/19/2017, 5:23 PM

## 2017-01-19 NOTE — Progress Notes (Signed)
   Subjective:  No acute events overnight.  She notes leg pain this morning and was preparing to take as needed dose of pain medication during evaluation.  Last bowel movement yesterday.  She did not work with PT yesterday, hoping to today.  She continues to await placement at [facility] for inpatient psych admission.  Objective:  Vital signs in last 24 hours: Vitals:   01/18/17 2300 01/19/17 0615 01/19/17 0806 01/19/17 0930  BP: (!) 179/86 (!) 197/93 (!) 195/91   Pulse: 77 83 80   Resp:   16   Temp: 98.4 F (36.9 C) 98.4 F (36.9 C) 98.2 F (36.8 C)   TempSrc: Oral Oral Oral   SpO2: 99% 95% 100%   Weight:    158 lb 15.2 oz (72.1 kg)  Height:       General: Resting in bed comfortably, no acute distress  CV: Systolic murmur, normal rate and rhythm Resp: Normal work of breathing, clear anterior breath sounds Abd: Soft, non-tender, +BS  Extr: No swelling, no significant TTP of RLE proximal musculature  Neuro: Alert and oriented GU: Foley remains in place   Assessment/Plan:  Bilateral Leg Pain Pt reports bilateral burning and aching leg pain, likely a result of her multiple sclerosis and diabetes. She is on both cymbalta, lyrica (gabapentin not effective), which are ideal for neuropathic type pain though this pain is difficult to control. Also complaining of spasm type pain. Will continue tylenol as well, tramadol available prn.  --Cont Lyrica 100 TID --Cont baclofen 5 mg PO TID   Multiple Sclerosis, potential NMO  Patient initially admitted for MS flare with concern for NMO and has completed plasmapheresis with improvement in deficits. Neurology consulted and followed for the course of MS tx.  Will need outpatient follow-up to initiate maintenance therapy.  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 Continues to express suicidal ideation.  Psych consulted here,  appreciate updated recommendations. SW working to find psychiatric placement following medical management.  --Continue Cymbalta 60 mg, Remeron 30 mg qhs  --Inpatient psych admission following medical stabilization  H/o Diabetes  CBGs have been variable throughout the course of admission with both hyper and hypoglycemia at various points depending on steroids, oral intake with various changes made throughout admission. Most recently blood glucose has been improved and stable with acceptable values in last 24 hours.  --Cont Lantus 27 U qhs, Novolog 12 U TID AC --Cont SSI, night coverage    Dispo: Anticipated discharge pending disposition issues.   [name], MD 01/19/2017, 10:00 AM

## 2017-01-19 NOTE — Progress Notes (Addendum)
CSW following to facilitate discharge planning. CSW reviewed chart and obtained notes that documented why the patient needed IV antibiotics from 11/14 to 11/17, and why they were discontinued. CSW provided documentation to [facility] Admissions for review. CSW faxed notes over to the Admissions office and discussed with EJ in Admissions office that the documentation was coming through fax. EJ indicated that the RN would review and would call back with additional questions.  CSW to continue to follow.  Blenda Nicely, [place] Clinical Social Worker [phone]   UPDATE 3:01PM:  CSW contacted Leonette Most at [facility] Admissions to confirm that the fax with the patient's notes had been received. Leonette Most indicated that the facility would like any additional lab work completed after the previous information sent (last CBC 01/15/17; already sent to [facility]) as well as the last 2 days worth of vital signs. CSW printed past 2 days of vitals and faxed to [facility] admissions.  CSW provided weekend coverage phone number for [facility] admissions staff to contact if they need additional information over the weekend. CSW will continue to follow.  Blenda Nicely, [place] Clinical Social Worker [phone]

## 2017-01-20 LAB — GLUCOSE, CAPILLARY
GLUCOSE-CAPILLARY: 130 mg/dL — AB (ref 65–99)
Glucose-Capillary: 220 mg/dL — ABNORMAL HIGH (ref 65–99)
Glucose-Capillary: 231 mg/dL — ABNORMAL HIGH (ref 65–99)
Glucose-Capillary: 151 mg/dL — ABNORMAL HIGH (ref 65–99)

## 2017-01-20 NOTE — Progress Notes (Signed)
CSW spoke with Vivien Rossetti from [facility] admission and confirmed that pt's referral has been received and is currently on its way back to Medical for further review. CSW will continue to follow for discharge needs.       Debra [name], MSW, LCSW-A Emergency Department Clinical Social Worker [phone]

## 2017-01-20 NOTE — Progress Notes (Signed)
   Subjective:   No acute events overnight.  She says her leg pain is about the same. [facility] Sitter at bedside.   Objective:  Vital signs in last 24 hours: Vitals:   01/19/17 1700 01/19/17 2125 01/20/17 0100 01/20/17 0625  BP: (!) 126/52 128/61 127/60 (!) 163/89  Pulse: 81 75 77 79  Resp: 18 18 18 18   Temp: 97.9 F (36.6 C) 98.1 F (36.7 C) 98.2 F (36.8 C) 98 F (36.7 C)  TempSrc: Oral Oral Oral Oral  SpO2: 99% 98% 97% 98%  Weight:    157 lb 3 oz (71.3 kg)  Height:       General: Resting in bed comfortably, no acute distress  CV: normal rate and rhythm Resp: Normal work of breathing, clear anterior breath sounds Abd: Soft, non-tender, +BS  Extr: No swelling, no significant TTP of RLE proximal musculature  Neuro: able to lift her right leg 1 foot, and left leg 0.5 foot  GU: Foley remains in place   Assessment/Plan:  Bilateral Leg Pain Pt reports bilateral burning and aching leg pain, likely a result of her multiple sclerosis and diabetes. She is on both cymbalta, lyrica (gabapentin not effective), which are ideal for neuropathic type pain though this pain is difficult to control. Also complaining of spasm type pain. Will continue tylenol as well, tramadol available prn.   --Cont Lyrica 100 TID --Cont baclofen 5 mg PO TID   Multiple Sclerosis, potential NMO  Patient initially admitted for MS flare with concern for NMO and has completed plasmapheresis with improvement in deficits. Neurology consulted and followed for the course of MS tx.  Will need outpatient follow-up to initiate maintenance therapy.  -outpatient follow up with neurology   Major Depressive Disorder Patient has a history of severe depression with psychotic features which is required inpatient psychiatric admission. Continues to express suicidal ideation.  Psych consulted here, appreciate updated recommendations. SW working to find psychiatric placement following  medical management.   --Continue Cymbalta 60 mg, Remeron 30 mg qhs  --Inpatient psych admission following medical stabilization  H/o Diabetes  CBGs have been variable throughout the course of admission with both hyper and hypoglycemia at various points depending on steroids, oral intake with various changes made throughout admission. Most recently blood glucose has been improved and stable with acceptable values in last 24 hours.   --Cont Lantus 27 U qhs, Novolog 12 U TID AC --Cont SSI, night coverage    Dispo: Anticipated discharge pending disposition issues.   Deneise Lever, MD 01/20/2017, 8:53 AM

## 2017-01-20 NOTE — Progress Notes (Signed)
Patient arrived to unit by stretcher.  Alert, oriented to 4.  Able to slide over to bed from stretcher.  Telemetry applied and verified.  No complaints of pain or discomfort. Return demonstration of use of call light.  Family at bedside and supportive.

## 2017-01-21 LAB — GLUCOSE, CAPILLARY
Glucose-Capillary: 167 mg/dL — ABNORMAL HIGH (ref 65–99)
Glucose-Capillary: 103 mg/dL — ABNORMAL HIGH (ref 65–99)
Glucose-Capillary: 96 mg/dL (ref 65–99)
Glucose-Capillary: 485 mg/dL — ABNORMAL HIGH (ref 65–99)

## 2017-01-21 NOTE — Progress Notes (Signed)
   Subjective:   No acute events overnight. Has a very flat affect.  She says her leg pain is about the same. [facility]    Objective:  Vital signs in last 24 hours: Vitals:   01/20/17 2143 01/21/17 0138 01/21/17 0532 01/21/17 0923  BP: (!) 153/73 (!) 145/70 (!) 178/88 (!) 184/84  Pulse: 87 82 75 79  Resp:  18 18 18   Temp: 98.8 F (37.1 C) 98.6 F (37 C) 98.6 F (37 C) 98.2 F (36.8 C)  TempSrc: Oral Oral Oral Oral  SpO2: 96% 98% 99% 96%  Weight:   152 lb 8.9 oz (69.2 kg)   Height:       General: Resting in bed comfortably, no acute distress , sitter at bedside  CV: normal rate and rhythm Resp: Normal work of breathing, clear anterior breath sounds Abd: Soft, non-tender, +BS  Extr: No swelling, no significant TTP of RLE proximal musculature  Neuro: able to lift her right leg 1 foot, and left leg 0.5 foot- same as before.   GU: Foley remains in place   Assessment/Plan:  Bilateral Leg Pain Pt reports bilateral burning and aching leg pain, likely a result of her multiple sclerosis and diabetes. She is on both cymbalta, lyrica (gabapentin not effective), which are ideal for neuropathic type pain though this pain is difficult to control. Also complaining of spasm type pain. Will continue tylenol as well, tramadol available prn.   --Cont Lyrica 100 TID --Cont baclofen 5 mg PO TID   Multiple Sclerosis, potential NMO  Patient initially admitted for MS flare with concern for NMO and has completed plasmapheresis with improvement in deficits. Neurology consulted and followed for the course of MS tx.  Will need outpatient follow-up to initiate maintenance therapy.  -outpatient follow up with neurology   Major Depressive Disorder Patient has a history of severe depression with psychotic features which is required inpatient psychiatric admission. Continues to express suicidal ideation.  Psych consulted here, appreciate updated recommendations. SW  working to find psychiatric placement following medical management.   --Continue Cymbalta 60 mg, Remeron 30 mg qhs  --Inpatient psych admission following medical stabilization  H/o Diabetes  CBGs have been variable throughout the course of admission with both hyper and hypoglycemia at various points depending on steroids, oral intake with various changes made throughout admission. Most recently blood glucose has been improved and stable with acceptable values in last 24 hours.   --Cont Lantus 27 U qhs, Novolog 12 U TID AC --Cont SSI, night coverage    Dispo: Anticipated discharge pending disposition issues.   Deneise Lever, MD 01/21/2017, 10:45 AM

## 2017-01-22 LAB — GLUCOSE, CAPILLARY
GLUCOSE-CAPILLARY: 116 mg/dL — AB (ref 65–99)
GLUCOSE-CAPILLARY: 100 mg/dL — AB (ref 65–99)
GLUCOSE-CAPILLARY: 169 mg/dL — AB (ref 65–99)
GLUCOSE-CAPILLARY: 331 mg/dL — AB (ref 65–99)
Glucose-Capillary: 492 mg/dL — ABNORMAL HIGH (ref 65–99)
Glucose-Capillary: 252 mg/dL — ABNORMAL HIGH (ref 65–99)

## 2017-01-22 MED ORDER — INSULIN GLARGINE 100 UNIT/ML ~~LOC~~ SOLN
30.0000 [IU] | Freq: Every day | SUBCUTANEOUS | Status: DC
  Administered 2017-01-22 – 2017-02-02 (×12): 30 [IU] via SUBCUTANEOUS
  Filled 2017-01-22 (×12): qty 0.3

## 2017-01-22 NOTE — Progress Notes (Signed)
Occupational Therapy Treatment Patient Details Name: Debra [name] MRN: [id number] DOB: 02-10-1966 Today's Date: 01/22/2017    History of present illness Pt is a 51 y/o female admitted to [facility] secondary to MS exacerbation and major depressive disorder. Pt transferred to [name] for insertion of R IJ catheter and plasmophoresis treatments. Pt also very depressed about recent decline in function and psychiatry following. Pt only completed 4/5 PLEX and was discontinued (possible infection acquired). PMH includes MS, HTN, DM with neuropathy, depression, heart murmur, and restless leg syndrome.    OT comments  Worked on facilitation of isolated trunk movement/core strengthening while EOC.  Pt became dizzy and diaphoretic.  RN notified.  BP 120/64; HR 68; 02 sats 100%.   Pt returned to supported sitting.  Goal updated due, however, due to medical issues this was not addressed this session.   Follow Up Recommendations  SNF;Supervision/Assistance - 24 hour    Equipment Recommendations  3 in 1 bedside commode    Recommendations for Other Services      Precautions / Restrictions Precautions Precautions: Fall Precaution Comments: suicide precautions        Mobility Bed Mobility                  Transfers                      Balance Overall balance assessment: Needs assistance Sitting-balance support: Feet supported;Bilateral upper extremity supported Sitting balance-Leahy Scale: Poor Sitting balance - Comments: requires UE support and occasional min A to correct balance                                    ADL either performed or assessed with clinical judgement   ADL                                               Vision       Perception     Praxis      Cognition Arousal/Alertness: Awake/alert Behavior During Therapy: Flat affect Overall Cognitive Status: Within Functional Limits for tasks assessed                                          Exercises Other Exercises Other Exercises: Pt moved to EOC with min gaurd assist with heavy reliance on UEs.  Sitting EOC, worked with pt on lower trunk initiated movements - anterior/posterior tilts, lateral weight shifts.  Pt with significant tightness throughout trunk, and tends to hold herself rigidly elevating bil. shoulders/scaps.   she was only able to perform small exursions of A/P tilts and lateral weight shifts with mod facilitation.  Pt tends to drift to the Lt requiring min facilitation for balance.  Worked on stabliizing trunk and performing head/neck ROM.  Pt became dizzy - see comments above.    Shoulder Instructions       General Comments Pt became diaphoretic with OT, c/o dizziness, and not feeling "right".  BP 120/64; 02 sats 100%; HR 68.  RN notified and checked BP     Pertinent Vitals/ Pain       Pain Assessment: No/denies pain  Home Living  Prior Functioning/Environment              Frequency  Min 2X/week        Progress Toward Goals  OT Goals(current goals can now be found in the care plan section)  Progress towards OT goals: Not progressing toward goals - comment  Acute Rehab OT Goals Patient Stated Goal: did not establish goals this date due to medical issues   Plan Discharge plan remains appropriate    Co-evaluation                 AM-PAC PT "6 Clicks" Daily Activity     Outcome Measure   Help from another person eating meals?: A Little Help from another person taking care of personal grooming?: A Little Help from another person toileting, which includes using toliet, bedpan, or urinal?: Total Help from another person bathing (including washing, rinsing, drying)?: A Lot Help from another person to put on and taking off regular upper body clothing?: A Lot Help from another person to put on and taking off regular lower body clothing?: Total 6  Click Score: 12    End of Session    OT Visit Diagnosis: Other abnormalities of gait and mobility (R26.89);Muscle weakness (generalized) (M62.81);Low vision, both eyes (H54.2)   Activity Tolerance Treatment limited secondary to medical complications (Comment)   Patient Left in chair;with call bell/phone within reach;with nursing/sitter in room   Nurse Communication Mobility status;Other (comment)(Pt dizzy and feels BS dropping )        Time: 4142-3953 OT Time Calculation (min): 22 min  Charges: OT General Charges $OT Visit: 1 Visit OT Treatments $Neuromuscular Re-education: 8-22 mins  [employer], OTR/L [phone]    [name] 01/22/2017, 7:05 PM

## 2017-01-22 NOTE — Progress Notes (Signed)
Contacted Internal medicine doctor about  cbg of 492.  Will go ahead and give 12 units of novolog now.  Will direct day nurse to recheck after meal, meal coverage can then be given; call provider if cbg is outside of range or additional coverage is needed. Thanks, [employer]

## 2017-01-22 NOTE — Progress Notes (Signed)
Paged on call about cbg...gave additional 5 units plus scheduled Lantus.  No new orders given

## 2017-01-22 NOTE — Progress Notes (Signed)
Patient still feeling dizzy and weak. VS stable. Pt AOx4. CBG 169

## 2017-01-22 NOTE — Progress Notes (Signed)
Patient became diaphoretic, weak, and had new onset difficulty speaking and blurred vision during physical therapy. Blood sugar 100, BP 114/61, HR 61, temp 97.8, RR 13, O2 99 on RA. Pupils sluggish. Grip and strength equal bilaterally and weak. Grape juice and dinner given to pt. MD notified. Will continue to monitor

## 2017-01-22 NOTE — Progress Notes (Signed)
Physical Therapy Treatment Patient Details Name: Debra [name] MRN: [id number] DOB: 1965-12-12 Today's Date: 01/22/2017    History of Present Illness Pt is a 51 y/o female admitted to [facility] secondary to MS exacerbation and major depressive disorder. Pt transferred to [facility] for insertion of R IJ catheter and plasmophoresis treatments. Pt also very depressed about recent decline in function and psychiatry following. Pt only completed 4/5 PLEX and was discontinued (possible infection acquired). PMH includes MS, HTN, DM with neuropathy, depression, heart murmur, and restless leg syndrome.     PT Comments    Pt with improved sitting balance and ability to transfer to EOB without assist. Pt con't to have bilat LE grossly 3-/5 with bilat hamstring tightness requiring maxAX2 for standing and std pvt transfers. Pt motivated and desired exercise program and appreciative for HEP provided. Acute PT to con't to follow.   Follow Up Recommendations  CIR     Equipment Recommendations  None recommended by PT    Recommendations for Other Services Rehab consult     Precautions / Restrictions Precautions Precautions: Fall Precaution Comments: suicide precautions  Restrictions Weight Bearing Restrictions: No    Mobility  Bed Mobility Overal bed mobility: Needs Assistance Bed Mobility: Rolling;Sidelying to Sit Rolling: Supervision Sidelying to sit: Modified independent (Device/Increase time);HOB elevated       General bed mobility comments: Pt required increased time and use of bed rails for all mobility.  Cues for foot and hand placement to perform rolling, assist for trunk to achieve sitting from supine and assistance to lift B LEs back into bed against gravity to return to supine. Pt presents with decreased core and LE strength during session.    Transfers Overall transfer level: Needs assistance Equipment used: 2 person hand held assist(2 person lift with gait belt and bed pad) Transfers:  Sit to/from [employer] Transfers Sit to Stand: Mod assist;+2 physical assistance Stand pivot transfers: Max assist;+2 physical assistance       General transfer comment: pt performed 3 sit to stands and 1 std pvt to chair. pt unable to achieve bilat knee extension due to extremely tight hamstrings bilaterally and bilat weak quadraceps. bilat knees blocked used bed pad under patient to achieve hip extension and support  Ambulation/Gait             General Gait Details: unable due to bilateral leg weakness   Stairs            Wheelchair Mobility    Modified Rankin (Stroke Patients Only)       Balance Overall balance assessment: Needs assistance Sitting-balance support: Feet supported;No upper extremity supported Sitting balance-Leahy Scale: Fair     Standing balance support: Bilateral upper extremity supported Standing balance-Leahy Scale: Zero Standing balance comment: Pt stands heavily reliant on UEs on Stedy with knees blocked mod +2 and unable to achieve full standing.                              Cognition Arousal/Alertness: Awake/alert Behavior During Therapy: WFL for tasks assessed/performed Overall Cognitive Status: Within Functional Limits for tasks assessed                                        Exercises General Exercises - Lower Extremity Ankle Circles/Pumps: Seated;Both;10 reps;AROM Quad Sets: AROM;Both;10 reps;Supine Gluteal Sets: AROM;Both;5 reps;Seated Long Arc Quad:  AROM;Both;5 reps;Seated Hip Flexion/Marching: AROM;Both;5 reps;Seated    General Comments        Pertinent Vitals/Pain Pain Assessment: Faces Faces Pain Scale: Hurts little more Pain Location: bil LEs Pain Descriptors / Indicators: Tightness Pain Intervention(s): Monitored during session    Home Living                      Prior Function            PT Goals (current goals can now be found in the care plan section) Progress  towards PT goals: Progressing toward goals    Frequency    Min 4X/week      PT Plan Discharge plan needs to be updated    Co-evaluation              AM-PAC PT "6 Clicks" Daily Activity  Outcome Measure  Difficulty turning over in bed (including adjusting bedclothes, sheets and blankets)?: Unable Difficulty moving from lying on back to sitting on the side of the bed? : Unable Difficulty sitting down on and standing up from a chair with arms (e.g., wheelchair, bedside commode, etc,.)?: Unable Help needed moving to and from a bed to chair (including a wheelchair)?: Total Help needed walking in hospital room?: Total Help needed climbing 3-5 steps with a railing? : Total 6 Click Score: 6    End of Session Equipment Utilized During Treatment: Gait belt Activity Tolerance: Patient tolerated treatment well;Patient limited by fatigue Patient left: in chair;with call bell/phone within reach;with nursing/sitter in room Nurse Communication: Mobility status(drop arm of chair and slide over using bed pad) PT Visit Diagnosis: Unsteadiness on feet (R26.81);Muscle weakness (generalized) (M62.81);Other symptoms and signs involving the nervous system (R29.898);Difficulty in walking, not elsewhere classified (R26.2)     Time: 8250-0370 PT Time Calculation (min) (ACUTE ONLY): 21 min  Charges:  $Therapeutic Activity: 8-22 mins                    G Codes:       Debra [name], PT, DPT Pager #: [phone] Office #: [phone]    Debra [name] M Shley Dolby 01/22/2017, 3:08 PM

## 2017-01-22 NOTE — Progress Notes (Signed)
   Subjective:   No acute events overnight.  Reports persistent and stable lower extremity pain.  Denies snacking overnight or other explanation of increase in blood glucose.  She continues to await potential placement at [facility] hospital.    Objective:  Vital signs in last 24 hours: Vitals:   01/21/17 2213 01/22/17 0148 01/22/17 0647 01/22/17 1027  BP: (!) 166/89 (!) 165/86 (!) 165/83 (!) 145/71  Pulse: 77 76 75 76  Resp: 18 20 18 18   Temp: 98.4 F (36.9 C) 98.5 F (36.9 C) 98.5 F (36.9 C) 98.4 F (36.9 C)  TempSrc: Oral Oral Oral Oral  SpO2: 99% 100% 97% 100%  Weight:      Height:       General: Resting in bed comfortably, no acute distress , sitter at bedside  CV: normal rate and rhythm Resp: Normal work of breathing, clear anterior breath sounds Abd: Soft, non-tender, +BS  Extr: No swelling Neuro: Alert and oriented x3, Stable LE deficits with ~4/5 strength bilaterally and R>L  GU: Foley remains in place   Assessment/Plan:  Bilateral Leg Pain Pt reports bilateral burning and aching leg pain, likely a result of her multiple sclerosis and diabetes. She is on both cymbalta, lyrica (gabapentin not effective), which are ideal for neuropathic type pain though this pain is difficult to control. Also complaining of spasm type pain. Will continue tylenol as well, tramadol available prn.  --Cont Lyrica 100 TID --Cont baclofen 5 mg PO TID   Multiple Sclerosis, potential NMO  Patient initially admitted for MS flare with concern for NMO and has completed plasmapheresis with improvement in deficits. Neurology consulted and followed for the course of MS tx.  Will need outpatient follow-up to initiate maintenance therapy.  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 Continues to express suicidal ideation.  Psych consulted here, appreciate updated recommendations. SW working to  find psychiatric placement following medical management.  --Continue Cymbalta 60 mg, Remeron 30 mg qhs  --Inpatient psych admission following medical stabilization  H/o Diabetes  CBGs have been variable throughout the course of admission with both hyper and hypoglycemia at various points depending on steroids, oral intake with various changes made throughout admission. Most recently blood glucose increased this morning,  --Increase Lantus to 30 U qhs, Cont Novolog 12 U TID AC --Cont SSI, night coverage    Dispo: Anticipated discharge pending disposition issues.   [name], MD 01/22/2017, 10:45 AM

## 2017-01-23 LAB — GLUCOSE, CAPILLARY
GLUCOSE-CAPILLARY: 127 mg/dL — AB (ref 65–99)
GLUCOSE-CAPILLARY: 198 mg/dL — AB (ref 65–99)
GLUCOSE-CAPILLARY: 440 mg/dL — AB (ref 65–99)
GLUCOSE-CAPILLARY: 461 mg/dL — AB (ref 65–99)
Glucose-Capillary: 52 mg/dL — ABNORMAL LOW (ref 65–99)
Glucose-Capillary: 356 mg/dL — ABNORMAL HIGH (ref 65–99)

## 2017-01-23 MED ORDER — INSULIN ASPART 100 UNIT/ML ~~LOC~~ SOLN
5.0000 [IU] | Freq: Once | SUBCUTANEOUS | Status: AC
  Administered 2017-01-23: 5 [IU] via SUBCUTANEOUS

## 2017-01-23 MED ORDER — BOOST / RESOURCE BREEZE PO LIQD
1.0000 | Freq: Two times a day (BID) | ORAL | Status: DC
  Administered 2017-01-24 – 2017-02-07 (×26): 1 via ORAL

## 2017-01-23 MED ORDER — ENSURE ENLIVE PO LIQD: 237.0000 mL | Freq: Three times a day (TID) | ORAL | Status: DC

## 2017-01-23 MED ORDER — INSULIN ASPART 100 UNIT/ML ~~LOC~~ SOLN
7.0000 [IU] | Freq: Three times a day (TID) | SUBCUTANEOUS | Status: DC
  Administered 2017-01-24 – 2017-01-25 (×4): 7 [IU] via SUBCUTANEOUS

## 2017-01-23 NOTE — Plan of Care (Signed)
With regards to the patient's maintenance therapy for multiple sclerosis.  Following completion of plasmapheresis therapy case was discussed with neurology who reports MS maintenance therapy is typically initiated as an outpatient due to various difficulties associated with medication cost and agent selection which are best managed by outpatient neurologist with a focus in multiple sclerosis.  If needed, further clarification may be obtained by consulting neurology team who is previously following the patient.

## 2017-01-23 NOTE — Progress Notes (Signed)
Initial Nutrition Assessment  DOCUMENTATION CODES:   Not applicable  INTERVENTION:  1. Boost Breeze po BID, each supplement provides 250 kcal and 9 grams of protein  NUTRITION DIAGNOSIS:   Inadequate oral intake related to acute illness, poor appetite as evidenced by per patient/family report.  GOAL:   Patient will meet greater than or equal to 90% of their needs  MONITOR:   PO intake, I & O's, Labs, Weight trends, Supplement acceptance  REASON FOR ASSESSMENT:   LOS    ASSESSMENT:   51 year old female with past medical history of depression, multiple sclerosis, hypertension, diabetes, who presented as a transfer from outside hospital for consideration of plasmapheresis.   Spoke with Ms. Bayerl at bedside. She reports some nausea currently. Appetite improving. Was eating a barbecue sandwich and asparagus during visit. She reports normally eating no breakfast, a sandwich for lunch, and a frozen meal or a prepared meal for dinner. Reports a UBW of 150 pounds, but has been told she lost weight recently. Per chart appears her weight has fluctuated between 150 - 168 pounds recently. Seems stable according to patient. May be related to some previous edema. Patient has drank ensure in the past, but reports it gives her diarrhea. Prefers boost breeze. Has recent history of bowel and urine incontinence. Denies any issues chewing/swallowing/choking.  Meal Completion: 100, 75, 25% - fluctuating but improving per patient.   Labs reviewed:  CBGs 127, 331 Medications reviewed and include:  Insulin (Type 1 diabetic)   Intake/Output Summary (Last 24 hours) at 01/23/2017 1436 Last data filed at 01/23/2017 7124 Gross per 24 hour  Intake 240 ml  Output 1250 ml  Net -1010 ml      NUTRITION - FOCUSED PHYSICAL EXAM:    Most Recent Value  Orbital Region  No depletion  Upper Arm Region  No depletion  Thoracic and Lumbar Region  No depletion  Buccal Region  No depletion  Temple Region   Mild depletion  Clavicle Bone Region  No depletion  Clavicle and Acromion Bone Region  No depletion  Scapular Bone Region  No depletion  Dorsal Hand  No depletion  Patellar Region  No depletion  Anterior Thigh Region  No depletion  Posterior Calf Region  No depletion  Edema (RD Assessment)  Mild       Diet Order:  Diet Carb Modified Fluid consistency: Thin; Room service appropriate? Yes  EDUCATION NEEDS:   No education needs have been identified at this time  Skin:  Skin Assessment: Skin Integrity Issues: Skin Integrity Issues:: Other (Comment) Other: MSAD to buttocks  Last BM:  11/24  Height:   Ht Readings from Last 1 Encounters:  01/22/17 5\' 4"  (1.626 m)    Weight:   Wt Readings from Last 1 Encounters:  01/22/17 150 lb 12.7 oz (68.4 kg)    Ideal Body Weight:  54.54 kg  BMI:  Body mass index is 25.88 kg/m.  Estimated Nutritional Needs:   Kcal:  1800-2000 calories  Protein:  90-115 grams  Fluid:  1.8-2L/day  Dionne Ano. Tyreke Kaeser, MS, RD LDN Inpatient Clinical Dietitian Pager [phone]

## 2017-01-23 NOTE — Care Management Note (Signed)
Case Management Note  Patient Details  Name: Cecilie Wincek MRN: [id number] Date of Birth: Jul 21, 1965  Subjective/Objective:                    Action/Plan: Awaiting bed at [facility]. CM following.  Expected Discharge Date:                  Expected Discharge Plan:  IP Rehab Facility  In-House Referral:  Clinical Social Work  Discharge planning Services  CM Consult  Post Acute Care Choice:    Choice offered to:     DME Arranged:    DME Agency:     HH Arranged:    HH Agency:     Status of Service:  In process, will continue to follow  If discussed at Long Length of Stay Meetings, dates discussed:    Additional Comments:  Kermit Balo, RN 01/23/2017, 2:14 PM

## 2017-01-23 NOTE — Progress Notes (Signed)
   Subjective:   No acute events overnight.  Yesterday afternoon patient reported hypoglycemic symptoms with blood glucose of 100.  This morning, states she feels fine with no significant changes in her mood, deficits, leg pain.  Last bowel movement this morning.  Objective:  Vital signs in last 24 hours: Vitals:   01/22/17 2117 01/23/17 0211 01/23/17 0614 01/23/17 1100  BP: (!) 154/82 136/70 (!) 160/79 (!) 160/75  Pulse: 67 71 73 80  Resp: 16 16 14 16   Temp: 97.7 F (36.5 C) 98.2 F (36.8 C) 99.1 F (37.3 C) 98.6 F (37 C)  TempSrc: Oral Oral Oral Oral  SpO2: 98% 100% 100% 98%  Weight:      Height:       General: Resting in bed comfortably, no acute distress , sitter at bedside  CV: normal rate and rhythm, systolic murmur  Resp: Normal work of breathing, clear anterior breath sounds Abd: Soft, non-tender, +BS  Extr: No swelling Neuro: Alert and oriented x3 GU: Foley remains in place   Assessment/Plan:  Bilateral Leg Pain Pt reports bilateral burning and aching leg pain, likely a result of her multiple sclerosis and diabetes. She is on both cymbalta, lyrica (gabapentin not effective), which are ideal for neuropathic type pain though this pain is difficult to control. Also complaining of spasm type pain. Will continue tylenol as well, tramadol available prn.  --Cont Lyrica 100 TID --Cont baclofen 5 mg PO TID   Multiple Sclerosis, potential NMO  Patient initially admitted for MS flare with concern for NMO and has completed plasmapheresis with improvement in deficits. Neurology consulted and followed for the course of MS tx.  Will need outpatient follow-up to initiate maintenance therapy.  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 Continues to express suicidal ideation.  Psych consulted here, appreciate updated recommendations. SW working to find psychiatric placement following  medical management.  --Continue Cymbalta 60 mg, Remeron 30 mg qhs  --Inpatient psych admission following medical stabilization  H/o Diabetes  CBGs have been variable throughout the course of admission with both hyper and hypoglycemia at various points depending on steroids, oral intake, etc with various changes made throughout admission. Bg improved with slight novolog increase, relative hypoglycemia likely related to 25% of meal eaten in documentaiton.  --Cont Lantus 30 U qhs, Cont Novolog 12 U TID AC --Cont SSI, night coverage    Dispo: Anticipated discharge pending disposition issues.   [name], MD 01/23/2017, 1:05 PM

## 2017-01-23 NOTE — Progress Notes (Signed)
Inpatient Diabetes Program Recommendations  AACE/ADA: New Consensus Statement on Inpatient Glycemic Control (2015)  Target Ranges:  Prepandial:   less than 140 mg/dL      Peak postprandial:   less than 180 mg/dL (1-2 hours)      Critically ill patients:  140 - 180 mg/dL   Lab Results  Component Value Date   GLUCAP 198 (H) 01/23/2017   HGBA1C 11.4 (H) 12/23/2016    Review of Glycemic ControlResults for DUAA, BEVERIDGE (MRN [id number]) as of 01/23/2017 15:48  Ref. Range 01/22/2017 18:32 01/22/2017 22:15 01/23/2017 06:13 01/23/2017 11:48 01/23/2017 12:51  Glucose-Capillary Latest Ref Range: 65 - 99 mg/dL 510 (H) 258 (H) 527 (H) 52 (L) 198 (H)    Diabetes history: Type 1 DM Outpatient Diabetes medications: Novolog 0-15 units tid with meals, Novolog 8 units tid with meals, Lantus 15 units bid Current orders for Inpatient glycemic control:  Novolog sensitive tid with meals and HS, Novolog 12 units tid with meals Lantus 30 units q HS Inpatient Diabetes Program Recommendations:   May consider reducing Novolog meal coverage 8 units tid with meals. Sent text page to MD.   Thanks, Beryl Meager, RN, BC-ADM Inpatient Diabetes Coordinator Pager [phone] (8a-5p)

## 2017-01-23 NOTE — Progress Notes (Signed)
Physical Therapy Treatment Patient Details Name: Debra [name] MRN: [id number] DOB: [birth year] Today's Date: 01/23/2017    History of Present Illness Pt is a 51 y/o female admitted to [facility] secondary to MS exacerbation and major depressive disorder. Pt transferred to [facility] for insertion of R IJ catheter and plasmophoresis treatments. Pt also very depressed about recent decline in function and psychiatry following. Pt only completed 4/5 PLEX and was discontinued (possible infection acquired). PMH includes MS, HTN, DM with neuropathy, depression, heart murmur, and restless leg syndrome.     PT Comments    Pt with increased lethargy today but agreeable to PT. Attempted standing with stedy this date however pt became diaphoretic. Blood sugar found to be 52. Pt returned to supine, RN made aware and brought in juice. Acute PT to con't to follow.   Follow Up Recommendations  CIR     Equipment Recommendations  None recommended by PT    Recommendations for Other Services Rehab consult     Precautions / Restrictions Precautions Precautions: Fall Precaution Comments: suicide precautions  Restrictions Weight Bearing Restrictions: No    Mobility  Bed Mobility Overal bed mobility: Needs Assistance Bed Mobility: Rolling;Sidelying to Sit Rolling: Supervision Sidelying to sit: Modified independent (Device/Increase time);HOB elevated       General bed mobility comments: Pt required increased time and use of bed rails for all mobility.  Cues for foot and hand placement to perform rolling, assist for trunk to achieve sitting from supine and assistance to lift B LEs back into bed against gravity to return to supine. Pt presents with decreased core and LE strength during session.    Transfers Overall transfer level: Needs assistance Equipment used: 2 person hand held assist Transfers: Sit to/from Stand Sit to Stand: Max assist;+2 physical assistance         General transfer comment: pt  completed 2 stands and became diaphertic. Pt blood sugar found to be 52. pt returned to bed  Ambulation/Gait                 Stairs            Wheelchair Mobility    Modified Rankin (Stroke Patients Only)       Balance Overall balance assessment: Needs assistance Sitting-balance support: Feet supported;Bilateral upper extremity supported Sitting balance-Leahy Scale: Poor Sitting balance - Comments: requires UE support and occasional min A to correct balance    Standing balance support: Bilateral upper extremity supported Standing balance-Leahy Scale: Zero Standing balance comment: Pt stands heavily reliant on UEs on Stedy with knees blocked mod +2 and unable to achieve full standing.                              Cognition Arousal/Alertness: Lethargic(sitter reports pt to be sleepy today) Behavior During Therapy: Flat affect Overall Cognitive Status: Within Functional Limits for tasks assessed                                        Exercises General Exercises - Lower Extremity Quad Sets: AROM;Both;5 reps;Supine    General Comments        Pertinent Vitals/Pain Pain Assessment: No/denies pain    Home Living                      Prior Function  PT Goals (current goals can now be found in the care plan section) Progress towards PT goals: Not progressing toward goals - comment(limited by blood sugar at 52)    Frequency    Min 4X/week      PT Plan Current plan remains appropriate    Co-evaluation              AM-PAC PT "6 Clicks" Daily Activity  Outcome Measure  Difficulty turning over in bed (including adjusting bedclothes, sheets and blankets)?: Unable Difficulty moving from lying on back to sitting on the side of the bed? : Unable Difficulty sitting down on and standing up from a chair with arms (e.g., wheelchair, bedside commode, etc,.)?: Unable Help needed moving to and from a bed to  chair (including a wheelchair)?: Total Help needed walking in hospital room?: Total Help needed climbing 3-5 steps with a railing? : Total 6 Click Score: 6    End of Session Equipment Utilized During Treatment: Gait belt Activity Tolerance: Patient limited by lethargy(blood sugar of 52) Patient left: in bed;with call bell/phone within reach;with nursing/sitter in room Nurse Communication: Mobility status PT Visit Diagnosis: Unsteadiness on feet (R26.81);Muscle weakness (generalized) (M62.81);Other symptoms and signs involving the nervous system (R29.898);Difficulty in walking, not elsewhere classified (R26.2)     Time: [id number] PT Time Calculation (min) (ACUTE ONLY): 28 min  Charges:  $Therapeutic Activity: 23-37 mins                    G Codes:       Debra ShockAshly Aleksia [name], PT, DPT Pager #: [phone] Office #: [phone]    Debra [name] Debra [name] 01/23/2017, 2:34 PM

## 2017-01-24 DIAGNOSIS — E11649 Type 2 diabetes mellitus with hypoglycemia without coma: Secondary | ICD-10-CM

## 2017-01-24 LAB — GLUCOSE, CAPILLARY
GLUCOSE-CAPILLARY: 220 mg/dL — AB (ref 65–99)
GLUCOSE-CAPILLARY: 107 mg/dL — AB (ref 65–99)
GLUCOSE-CAPILLARY: 230 mg/dL — AB (ref 65–99)
GLUCOSE-CAPILLARY: 321 mg/dL — AB (ref 65–99)

## 2017-01-24 MED ORDER — MIRTAZAPINE 15 MG PO TABS
45.0000 mg | ORAL_TABLET | Freq: Every day | ORAL | Status: DC
  Administered 2017-01-24 – 2017-02-14 (×22): 45 mg via ORAL
  Filled 2017-01-24 (×22): qty 3

## 2017-01-24 MED ORDER — HYDRALAZINE HCL 50 MG PO TABS
100.0000 mg | ORAL_TABLET | Freq: Three times a day (TID) | ORAL | Status: DC
  Administered 2017-01-24 – 2017-02-09 (×47): 100 mg via ORAL
  Filled 2017-01-24 (×47): qty 2

## 2017-01-24 NOTE — Progress Notes (Signed)
CSW spoke to [place] from [facility] via phone. Hubbard stated patient needed added information on maintenance therapy for her multiple sclerosis. CSW faxed over information to [facility] for facility to look over. Hubbard stated once information is received RN will contact CSW back.   Marrianne Mood, MSW,  [employer] [phone]

## 2017-01-24 NOTE — Progress Notes (Signed)
CSW received call from [facility]- they state that information provided regarding MS was not sufficient and that they need long term treatment care plan/ maintenance therapy recommendations for this patient in order to admit.  They suggest that MD reach out to pt original neurologist and see what his plan was.  CSW relayed information to MD and will continue to follow and assist as needed  Burna Sis, LCSW Clinical Social Worker [phone]

## 2017-01-24 NOTE — Consult Note (Addendum)
[facility] Psych Consult Progress Note  01/24/2017 3:35 PM Debra [name]  MRN:  [id number]   Subjective:   Debra [name] reports that her mood is okay but she still has suicidal thoughts. She denies any improvement in SI. Her appetite and sleep fluctuate. She denies problems or side effects with her medications. She reports that she is working with PT but it is difficult and she has not seen any improvement in her strength.   Principal Problem: Major depressive disorder, single episode, severe without psychosis (HCC) Diagnosis:   Patient Active Problem List   Diagnosis Date Noted  . Major depressive disorder, single episode, severe without psychosis (HCC) [F32.2] 01/02/2017  . Multiple sclerosis exacerbation (HCC) [G35] 01/01/2017  . Dysthymia [F34.1] 12/27/2016  . Major depressive disorder, recurrent, severe with psychotic features (HCC) [F33.3]   . Multiple joint pain [M25.50]   . HTN (hypertension) [I10]   . Acute pain of left knee [M25.562]   . Neurogenic bladder [N31.9]   . Acute lower UTI [N39.0]   . Labile blood pressure [R09.89]   . Acute blood loss anemia [D62]   . Leukocytosis [D72.829]   . Labile blood glucose [R73.09]   . Hypertensive crisis [I16.9]   . Hypoglycemia [E16.2]   . Poorly controlled type 2 diabetes mellitus with peripheral neuropathy (HCC) [E11.42, E11.65]   . Uncontrolled hypertension [I10]   . Sleep disturbance [G47.9]   . Multiple sclerosis (HCC) [G35] 11/22/2016  . Thoracic root lesion [G54.3] 11/22/2016  . MS (multiple sclerosis) (HCC) [G35]   . Neuropathic pain [M79.2]   . Uncontrolled type 2 diabetes mellitus with peripheral neuropathy (HCC) [E11.42, E11.65]   . Urinary retention [R33.9]   . Medically noncompliant [Z91.19]   . Leg weakness, bilateral [R29.898] 11/20/2016  . Diabetic ketoacidosis without coma associated with type 1 diabetes mellitus (HCC) [E10.10]   . Leg pain [M79.606]   . History of CVA (cerebrovascular accident) [Z86.73]   .  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 Benign essential HTN [I10]   . Hypertensive urgency [I16.0]   . AKI (acute kidney injury) (HCC) [N17.9]   . Fibromyalgia [M79.7] 11/07/2016  . Back pain [M54.9] 10/31/2016  . Stable angina pectoris (HCC) [I20.8]   . Vitamin D deficiency [E55.9] 08/31/2014  . 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 Hyperlipidemia [E78.5] 10/06/2013  . Depression [F32.9] 10/16/2012  . Bilateral knee pain [M25.561, M25.562] 02/16/2012  . Chronic diarrhea [K52.9] 10/19/2010  . Peripheral neuropathy [G62.9] 08/19/2010  . Type 1 diabetes mellitus (HCC) [E10.9] 07/06/2010  . Hypertension [I10] 07/06/2010  . Asthma [J45.909] 07/06/2010  . Migraine [G43.909] 07/06/2010   Total Time spent with patient: 15 minutes  Past Psychiatric History: Depression   Past Medical History:  Past Medical History:  Diagnosis Date  . Adenomatous colonic polyps   . Anemia    2005  . Anxiety    1990  . Arthritis   . Asthma    2000  . Cataract   . Depression 1990  . Difficult intubation    narrow airway  . Gastroesophageal Reflux Disease (GERD)   . Heart murmur    Birth  . Hyperlipidemia    2005  . Hypertension    1998  . Internal hemorrhoids 04/24/06   on colonoscopy  . Migraine   . Neuropathy of the hands & feet   . Restless Leg Syndrome   . Right Ankle Fracture 10/06/2013  .  Sleep paralysis   . Stable angina pectoris    2007: cath showing normal cors.   . Stroke 1990  . Type I Diabetes Mellitus 1988    Past Surgical History:  Procedure Laterality Date  . bilateral foot surgery    . BREAST SURGERY Left    biopsy left breast  . CARDIAC CATHETERIZATION  2007   around 2007 or 2008  . CESAREAN SECTION    . CHOLECYSTECTOMY    . COLONOSCOPY W/ BIOPSIES AND POLYPECTOMY    . DILATION AND CURETTAGE OF UTERUS  12/29/2010   Surgeon: Tereso Newcomer, MD;  Location: [facility]  ORS;  Service: Gynecology  . ENDOMETRIAL ABLATION W/ NOVASURE N/A 12/2009  . EYE SURGERY    . HYSTEROSCOPY  12/29/2010   Procedure: HYSTEROSCOPY WITH HYDROTHERMAL ABLATION;  Surgeon: Tereso Newcomer, MD;  Location: [facility] ORS;  Service: Gynecology;;  . IUD REMOVAL  12/29/2010   Procedure: INTRAUTERINE DEVICE (IUD) REMOVAL;  Surgeon: Tereso Newcomer, MD;  Location: [facility] ORS;  Service: Gynecology;  Laterality: N/A;  . LAPAROSCOPIC TUBAL LIGATION  12/29/2010   Procedure: LAPAROSCOPIC TUBAL LIGATION;  Surgeon: Tereso Newcomer, MD;  Location: [facility] ORS;  Service: Gynecology;  Laterality: Bilateral;  . ORIF ANKLE FRACTURE Right 10/09/2013   Procedure: Open reduction internal fixation right ankle;  Surgeon: Mable Paris, MD;  Location: [facility] OR;  Service: Orthopedics;  Laterality: Right;  Open reduction internal fixation right ankle  . TRIGGER FINGER RELEASE     x 3   Family History:  Family History  Problem Relation Age of Onset  . Hypertension Mother   . Kidney disease Mother   . Hypertension Father   . Breast cancer Maternal Grandmother   . Prostate cancer Maternal Grandfather   . Ovarian cancer Paternal Grandmother   . Prostate cancer Paternal Grandfather   . Colon cancer Maternal Uncle        Family history of malignant neoplasm of gastrointestinal tract   Family Psychiatric  History: Denies  Social History:  Social History   Substance and Sexual Activity  Alcohol Use No  . Alcohol/week: 0.0 oz     Social History   Substance and Sexual Activity  Drug Use No   Comment: drug addict    Social History   Socioeconomic History  . Marital status: Divorced    Spouse name: None  . Number of children: 2  . Years of education: 53  . Highest education level: None  Social Needs  . Financial resource strain: None  . Food insecurity - worry: None  . Food insecurity - inability: None  . Transportation needs - medical: None  . Transportation needs - non-medical: None  Occupational  History  . Occupation: Unemployed  Tobacco Use  . Smoking status: Never Smoker  . Smokeless tobacco: Never Used  Substance and Sexual Activity  . Alcohol use: No    Alcohol/week: 0.0 oz  . Drug use: No    Comment: drug addict  . Sexual activity: Yes    Birth control/protection: Other-see comments    Comment: pt states she had ablation in 2011  Other Topics Concern  . None  Social History Narrative   Lost medicaid about 2009 ish when her youngest child turned 31. Has adult children. Lives with boyfriend who financially supports her. Has attempted to get disability but has been turned down.      2-3 caffeine drinks daily     Sleep: Fair  Appetite:  Fair  Current Medications:  Current Facility-Administered Medications  Medication Dose Route Frequency Provider Last Rate Last Dose  . acetaminophen (TYLENOL) tablet 650 mg  650 mg Oral Q4H PRN Charm RingsLord, Jamison Y, NP   650 mg at 01/16/17 2229  . albuterol (PROVENTIL) (2.5 MG/3ML) 0.083% nebulizer solution 2.5 mg  2.5 mg Inhalation Q4H PRN Charm RingsLord, Jamison Y, NP      . alum & mag hydroxide-simeth (MAALOX/MYLANTA) 200-200-20 MG/5ML suspension 30 mL  30 mL Oral Q4H PRN Charm RingsLord, Jamison Y, NP      . amLODipine (NORVASC) tablet 10 mg  10 mg Oral Daily Ginger CarneHarden, Kyler, MD   10 mg at 01/24/17 1023  . aspirin EC tablet 81 mg  81 mg Oral Daily Earl LagosNarendra, Nischal, MD   81 mg at 01/24/17 1022  . atorvastatin (LIPITOR) tablet 40 mg  40 mg Oral Daily Earl LagosNarendra, Nischal, MD   40 mg at 01/24/17 1022  . baclofen (LIORESAL) tablet 5 mg  5 mg Oral TID Ginger CarneHarden, Kyler, MD   5 mg at 01/24/17 1023  . bethanechol (URECHOLINE) tablet 50 mg  50 mg Oral TID Charm RingsLord, Jamison Y, NP   50 mg at 01/24/17 1022  . DULoxetine (CYMBALTA) DR capsule 60 mg  60 mg Oral Daily Ginger CarneHarden, Kyler, MD   60 mg at 01/24/17 1023  . feeding supplement (BOOST / RESOURCE BREEZE) liquid 1 Container  1 Container Oral BID BM Earl LagosNarendra, Nischal, MD   1 Container at 01/24/17 1441  . heparin injection 5,000 Units   5,000 Units Subcutaneous Q8H Deneise LeverSaraiya, Parth, MD   5,000 Units at 01/24/17 1218  . hydrALAZINE (APRESOLINE) tablet 100 mg  100 mg Oral Q8H Ginger CarneHarden, Kyler, MD      . insulin aspart (novoLOG) injection 0-5 Units  0-5 Units Subcutaneous QHS Ginger CarneHarden, Kyler, MD   5 Units at 01/22/17 2306  . insulin aspart (novoLOG) injection 0-9 Units  0-9 Units Subcutaneous TID WC Ginger CarneHarden, Kyler, MD   3 Units at 01/24/17 [phone]  . insulin aspart (novoLOG) injection 7 Units  7 Units Subcutaneous TID WC Ginger CarneHarden, Kyler, MD   7 Units at 01/24/17 1217  . insulin glargine (LANTUS) injection 30 Units  30 Units Subcutaneous QHS Ginger CarneHarden, Kyler, MD   30 Units at 01/23/17 2211  . magnesium hydroxide (MILK OF MAGNESIA) suspension 30 mL  30 mL Oral Daily PRN Charm RingsLord, Jamison Y, NP   30 mL at 01/10/17 0050  . metoprolol succinate (TOPROL-XL) 24 hr tablet 75 mg  75 mg Oral Daily Ginger CarneHarden, Kyler, MD   75 mg at 01/24/17 1022  . mirtazapine (REMERON) tablet 30 mg  30 mg Oral QHS Ginger CarneHarden, Kyler, MD   30 mg at 01/23/17 2212  . ondansetron (ZOFRAN) tablet 4 mg  4 mg Oral Q8H PRN Charm RingsLord, Jamison Y, NP   4 mg at 01/05/17 1211  . pantoprazole (PROTONIX) EC tablet 40 mg  40 mg Oral Daily Earl LagosNarendra, Nischal, MD   40 mg at 01/24/17 1023  . polyethylene glycol (MIRALAX / GLYCOLAX) packet 17 g  17 g Oral Daily PRN Ginger CarneHarden, Kyler, MD      . pregabalin (LYRICA) capsule 100 mg  100 mg Oral TID Ginger CarneHarden, Kyler, MD   100 mg at 01/24/17 1023  . sodium chloride flush (NS) 0.9 % injection 10-40 mL  10-40 mL Intracatheter PRN Earl LagosNarendra, Nischal, MD   10 mL at 01/10/17 0816  . sodium chloride flush (NS) 0.9 % injection 3 mL  3 mL Intravenous Q12H Deneise LeverSaraiya, Parth, MD   3 mL at  01/22/17 1004  . traMADol (ULTRAM) tablet 50 mg  50 mg Oral Q6H PRN [name], MD   50 mg at 01/22/17 2305  . zolpidem (AMBIEN) tablet 5 mg  5 mg Oral QHS PRN Charm Rings, NP   5 mg at 01/21/17 2208    Lab Results:  Results for orders placed or performed during the hospital encounter of 01/02/17  (from the past 48 hour(s))  Glucose, capillary     Status: Abnormal   Collection Time: 01/22/17  4:21 PM  Result Value Ref Range   Glucose-Capillary 116 (H) 65 - 99 mg/dL  Glucose, capillary     Status: Abnormal   Collection Time: 01/22/17  5:24 PM  Result Value Ref Range   Glucose-Capillary 100 (H) 65 - 99 mg/dL   Comment 1 Notify RN    Comment 2 Document in Chart   Glucose, capillary     Status: Abnormal   Collection Time: 01/22/17  6:32 PM  Result Value Ref Range   Glucose-Capillary 169 (H) 65 - 99 mg/dL  Glucose, capillary     Status: Abnormal   Collection Time: 01/22/17 10:15 PM  Result Value Ref Range   Glucose-Capillary 331 (H) 65 - 99 mg/dL   Comment 1 Notify RN    Comment 2 Document in Chart   Glucose, capillary     Status: Abnormal   Collection Time: 01/23/17  6:13 AM  Result Value Ref Range   Glucose-Capillary 127 (H) 65 - 99 mg/dL   Comment 1 Notify RN    Comment 2 Document in Chart   Glucose, capillary     Status: Abnormal   Collection Time: 01/23/17 11:48 AM  Result Value Ref Range   Glucose-Capillary 52 (L) 65 - 99 mg/dL  Glucose, capillary     Status: Abnormal   Collection Time: 01/23/17 12:51 PM  Result Value Ref Range   Glucose-Capillary 198 (H) 65 - 99 mg/dL  Glucose, capillary     Status: Abnormal   Collection Time: 01/23/17  5:31 PM  Result Value Ref Range   Glucose-Capillary 356 (H) 65 - 99 mg/dL   Comment 1 Notify RN    Comment 2 Document in Chart   Glucose, capillary     Status: Abnormal   Collection Time: 01/23/17  8:59 PM  Result Value Ref Range   Glucose-Capillary 440 (H) 65 - 99 mg/dL  Glucose, capillary     Status: Abnormal   Collection Time: 01/23/17  9:56 PM  Result Value Ref Range   Glucose-Capillary 461 (H) 65 - 99 mg/dL  Glucose, capillary     Status: Abnormal   Collection Time: 01/24/17  6:17 AM  Result Value Ref Range   Glucose-Capillary 220 (H) 65 - 99 mg/dL  Glucose, capillary     Status: Abnormal   Collection Time: 01/24/17  11:52 AM  Result Value Ref Range   Glucose-Capillary 107 (H) 65 - 99 mg/dL    Blood Alcohol level:  Lab Results  Component Value Date   ETH <10 12/20/2016   ETH  12/09/2006    <5        LOWEST DETECTABLE LIMIT FOR SERUM ALCOHOL IS 11 mg/dL FOR MEDICAL PURPOSES ONLY    Musculoskeletal: Strength & Muscle Tone: Bilateral lower extremity weakness. Gait & Station: UTA since patient lying in bed. Patient leans: N/A   Psychiatric Specialty Exam: Physical Exam  Nursing note and vitals reviewed. Constitutional: She is oriented to person, place, and time. She appears well-developed and well-nourished.  HENT:  Head: Normocephalic and atraumatic.  Neck: Normal range of motion.  Respiratory: Effort normal.  Musculoskeletal: Normal range of motion.  Neurological: She is alert and oriented to person, place, and time.  Skin: No rash noted.  Psychiatric: Her speech is normal and behavior is normal. Judgment and thought content normal. Cognition and memory are normal.    Review of Systems  Constitutional: Negative for chills and fever.  Eyes: Positive for blurred vision.  Cardiovascular: Negative for chest pain.  Gastrointestinal: Negative for constipation, diarrhea, nausea and vomiting.  Psychiatric/Behavioral: Positive for depression and suicidal ideas. Negative for hallucinations and substance abuse. The patient is not nervous/anxious and does not have insomnia.     Blood pressure (!) 164/76, pulse 75, temperature 98.6 F (37 C), temperature source Oral, resp. rate 19, height 5\' 4"  (1.626 m), weight 68 kg (149 lb 14.6 oz), SpO2 98 %.Body mass index is 25.73 kg/m.  General Appearance: Well Groomed, middle aged, African American female with corrective lenses and a hospital gown who is lying in bed. NAD.  Eye Contact:  Good  Speech:  Clear and Coherent and Normal Rate  Volume:  Normal  Mood:  Okay  Affect:  Constricted  Thought Process:  Goal Directed and Linear  Orientation:  Full  (Time, Place, and Person)  Thought Content:  Logical  Suicidal Thoughts:  Yes.  without intent/plan  Homicidal Thoughts:  No  Memory:  Immediate;   Good Recent;   Good Remote;   Good  Judgement:  Good  Insight:  Good  Psychomotor Activity:  Normal  Concentration:  Concentration: Good and Attention Span: Good  Recall:  Good  Fund of Knowledge:  Good  Language:  Good  Akathisia:  No  Handed:  Right  AIMS (if indicated):   N/A  Assets:  Communication Skills Desire for Improvement  ADL's:  Intact with assistance.  Cognition:  WNL  Sleep:   Okay    Assessment: Debra [name] is a 51 y.o. female who was admitted withacute MS from [facility] for plasma exchange treatment due to worsening vision that was not improved with Solu-Medrol.She has had improvement of MS symptoms butcontinues to endorse SI with depressive symptoms that have not improvedsince hospitalization [facility].Shecontinues towarrant inpatient psychiatric hospitalization once she is medically cleared due to high risk of harm to self.   Treatment Plan Summary: -Continue bedside sitter due to ongoing SI and risk of harm to self.  -Continue Cymbalta 60 mg daily for depression.  -IncreaseRemeron 30 mg to 45 mg qhsfor sleep, anxiety and depression.  -Patientcontinues torequire inpatient psychiatric hospitalization following medical clearance given continued SI. -It appears that patient has several psychosocial stressors that are contributing to her depressive symptoms and ongoing SI such as lack of social support and stable housing. SW is also following the patient. It may be beneficial for her to be provided with resources for housing and/or additional supports to help with her care.   Cherly Beach, DO 01/24/2017, 3:35 PM

## 2017-01-24 NOTE — Progress Notes (Signed)
Patient blood sugar 461 tonight. Dr.Lacroce notified.New order for 5 units novolog  insulin sq in addition to scheduled 30 units of  lantus  insulin tonight.Will continue to monitor.

## 2017-01-24 NOTE — Progress Notes (Signed)
Occupational Therapy Treatment Patient Details Name: Debra [name] MRN: [id number] DOB: [birth year] Today's Date: 01/24/2017    History of present illness Pt is a 51 y/o female admitted to [facility] secondary to MS exacerbation and major depressive disorder. Pt transferred [facility] for insertion of R IJ catheter and plasmophoresis treatments. Pt also very depressed about recent decline in function and psychiatry following. Pt only completed 4/5 PLEX and was discontinued (possible infection acquired). PMH includes MS, HTN, DM with neuropathy, depression, heart murmur, and restless leg syndrome.    OT comments  Pt with improved activity tolerance.  Worked on improving trunk/core mobility and strengthening in supine, side lying, and seated.   Pt with significant tightness and weakness, but demonstrates improved ability to access available movement.  Will continue to follow.    Follow Up Recommendations  SNF;Supervision/Assistance - 24 hour    Equipment Recommendations  None recommended by OT    Recommendations for Other Services      Precautions / Restrictions Precautions Precautions: Fall Precaution Comments: suicide precautions        Mobility Bed Mobility Overal bed mobility: Needs Assistance Bed Mobility: Rolling;Sidelying to Sit;Sit to Sidelying Rolling: Supervision Sidelying to sit: Min assist     Sit to sidelying: Min assist General bed mobility comments: Performed without use of bedrails, and with HOB flat   Transfers                      Balance Overall balance assessment: Needs assistance Sitting-balance support: Feet supported;Single extremity supported Sitting balance-Leahy Scale: Poor Sitting balance - Comments: pt initially able to maintain static sitting with UE support and min guard assit, however, as she fatigued, she required mod A and demonstrated forward lean                                    ADL either performed or assessed with  clinical judgement   ADL                                               Vision       Perception     Praxis      Cognition Arousal/Alertness: Awake/alert Behavior During Therapy: Flat affect(did weakly smile a few times ) Overall Cognitive Status: Within Functional Limits for tasks assessed                                          Exercises Other Exercises Other Exercises: Worked on facilitation of Anterior/posterior pelvic tilts and bil. lateral flexion in supine, sidelying, and sitting.   Progressed to rotational movements Lt and right while incorporating ant/post tilts and lateral flexion - requires mod facilitation.  soft tissue mobilization performed anterior/posterior pelvic tilt, thoracic felxion.      Shoulder Instructions       General Comments      Pertinent Vitals/ Pain       Pain Assessment: No/denies pain  Home Living  Prior Functioning/Environment              Frequency  Min 2X/week        Progress Toward Goals  OT Goals(current goals can now be found in the care plan section)  Progress towards OT goals: Progressing toward goals  Acute Rehab OT Goals Patient Stated Goal: to get stronger  OT Goal Formulation: With patient Time For Goal Achievement: 02/07/17 Potential to Achieve Goals: Good ADL Goals Pt Will Perform Grooming: with min guard assist;sitting Pt Will Perform Upper Body Dressing: with min assist(unsupported sitting ) Pt Will Perform Lower Body Dressing: with max assist;sit to/from stand Pt Will Transfer to Toilet: with max assist;squat pivot transfer;bedside commode Pt Will Perform Toileting - Clothing Manipulation and hygiene: with max assist;sit to/from stand  Plan Discharge plan remains appropriate;Other (comment)(goals updated )    Co-evaluation                 AM-PAC PT "6 Clicks" Daily Activity     Outcome Measure    Help from another person eating meals?: A Little Help from another person taking care of personal grooming?: A Little Help from another person toileting, which includes using toliet, bedpan, or urinal?: Total Help from another person bathing (including washing, rinsing, drying)?: A Lot Help from another person to put on and taking off regular upper body clothing?: A Lot Help from another person to put on and taking off regular lower body clothing?: Total 6 Click Score: 12    End of Session    OT Visit Diagnosis: Other abnormalities of gait and mobility (R26.89);Muscle weakness (generalized) (M62.81);Low vision, both eyes (H54.2)   Activity Tolerance Patient tolerated treatment well   Patient Left in bed;with call bell/phone within reach;with nursing/sitter in room   Nurse Communication Mobility status        Time: 3748-2707 OT Time Calculation (min): 35 min  Charges: OT General Charges $OT Visit: 1 Visit OT Treatments $Neuromuscular Re-education: 23-37 mins  [employer], OTR/L [phone]     [name] 01/24/2017, 1:31 PM

## 2017-01-24 NOTE — Progress Notes (Signed)
   Subjective:   Overnight, patient had blood sugars in the 400.  It appears she did not receive mealtime correctional in the evening, which was adjusted to 7 units after hypoglycemic event earlier in the day.  Otherwise, she continues to states she feels fine, has a flat affect.  She worked with physical therapy yesterday.  Objective:  Vital signs in last 24 hours: Vitals:   01/23/17 1700 01/23/17 2102 01/24/17 0117 01/24/17 0600  BP:  (!) 170/68 (!) 153/81 (!) 172/81  Pulse:  77 72 76  Resp:  16 18 18   Temp:  98.9 F (37.2 C) 98.2 F (36.8 C) 98 F (36.7 C)  TempSrc:  Oral Oral Oral  SpO2:  100% 100% 100%  Weight: 150 lb 3.2 oz (68.1 kg)   149 lb 14.6 oz (68 kg)  Height:       General: Resting in bed comfortably, no acute distress , sitter at bedside  CV: normal rate and rhythm, systolic murmur  Resp: Normal work of breathing, clear anterior breath sounds Abd: Soft, non-tender, +BS  Extr: No LE edema  Neuro: Alert and oriented x3 GU: Foley remains in place   Assessment/Plan:  Bilateral Leg Pain  Pt reports bilateral burning and aching leg pain, likely a result of her multiple sclerosis and diabetes. She is on both cymbalta, lyrica (gabapentin not effective), which are ideal for neuropathic type pain though this pain is difficult to control. Also complaining of spasm type pain. Will continue tylenol as well, tramadol available prn.  --Cont Lyrica 100 TID --Cont baclofen 5 mg PO TID   Multiple Sclerosis, potential NMO  Patient initially admitted for MS flare with concern for NMO and has completed plasmapheresis with improvement in deficits. Neurology consulted and followed for the course of MS tx.  Per neurology, will need outpatient follow-up to initiate maintenance therapy.  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 Continues to express suicidal ideation.  Psych consulted  here, appreciate updated recommendations. SW working to find psychiatric placement following medical management.  --Continue Cymbalta 60 mg, Remeron 30 mg qhs  --Inpatient psych admission following medical stabilization  H/o Diabetes  CBGs have been variable throughout the course of admission with both hyper and hypoglycemia at various points depending on steroids, oral intake, etc with various changes made throughout admission.  Patient has had up/down glucose values, will attempt to monitor closely to stabilize at an acceptable range. --Cont Lantus 30 U qhs, Novolog to 7 U TID AC --Cont SSI, night coverage    Dispo: Anticipated discharge pending disposition issues.   [name], MD 01/24/2017, 10:17 AM

## 2017-01-24 NOTE — Progress Notes (Signed)
Physical Therapy Treatment Patient Details Name: Debra [name] MRN: [id number] DOB: 1965/12/04 Today's Date: 01/24/2017    History of Present Illness Pt is a 51 y/o female admitted to [facility] secondary to MS exacerbation and major depressive disorder. Pt transferred to [facility] for insertion of R IJ catheter and plasmophoresis treatments. Pt also very depressed about recent decline in function and psychiatry following. Pt only completed 4/5 PLEX and was discontinued (possible infection acquired). PMH includes MS, HTN, DM with neuropathy, depression, heart murmur, and restless leg syndrome.     PT Comments    Pt remains to have extremely tight hamstrings. Pt fatigued from early OT session and then having had a BM and get cleaned up in the bed. Pt did tolerate 3 sit to stand transfers in stedy with maxA to achieve standing and min assist to maintain standing. First stand x 60 sec, last 2 10 sec. Acute PT to con't to follow.   Follow Up Recommendations  CIR     Equipment Recommendations  None recommended by PT    Recommendations for Other Services Rehab consult     Precautions / Restrictions Precautions Precautions: Fall Precaution Comments: suicide precautions  Restrictions Weight Bearing Restrictions: No    Mobility  Bed Mobility Overal bed mobility: Needs Assistance Bed Mobility: Rolling;Sidelying to Sit;Sit to Sidelying Rolling: Min assist(due to rolling to L today instead of right) Sidelying to sit: Min assist   Sit to supine: Min assist Sit to sidelying: Min assist General bed mobility comments: assist for trunk elevation and to bring LE back into bed. maxAx2 to scoot to [facility]  Transfers Overall transfer level: Needs assistance   Transfers: Sit to/from Stand Sit to Stand: Max assist         General transfer comment: pt completed 3 sit to stand in steady. PT used bed pad under pt to assist with powering up due to extremely weak LEs and extremely tight hamstrings. pt  fatigued quickly and began to get dizzy  Ambulation/Gait             General Gait Details: pt unable   Stairs            Wheelchair Mobility    Modified Rankin (Stroke Patients Only)       Balance Overall balance assessment: Needs assistance Sitting-balance support: Feet supported;Single extremity supported Sitting balance-Leahy Scale: Poor Sitting balance - Comments: requiers bilat UE support                                    Cognition Arousal/Alertness: Awake/alert Behavior During Therapy: Flat affect Overall Cognitive Status: Within Functional Limits for tasks assessed                                        Exercises Other Exercises Other Exercises: Worked on facilitation of Anterior/posterior pelvic tilts and bil. lateral flexion in supine, sidelying, and sitting.   Progressed to rotational movements Lt and right while incorporating ant/post tilts and lateral flexion - requires mod facilitation.  soft tissue mobilization performed anterior/posterior pelvic tilt, thoracic felxion.       General Comments        Pertinent Vitals/Pain Pain Assessment: No/denies pain    Home Living  Prior Function            PT Goals (current goals can now be found in the care plan section) Acute Rehab PT Goals Patient Stated Goal: to get stronger  Progress towards PT goals: Progressing toward goals    Frequency    Min 4X/week      PT Plan Current plan remains appropriate    Co-evaluation              AM-PAC PT "6 Clicks" Daily Activity  Outcome Measure  Difficulty turning over in bed (including adjusting bedclothes, sheets and blankets)?: Unable Difficulty moving from lying on back to sitting on the side of the bed? : Unable Difficulty sitting down on and standing up from a chair with arms (e.g., wheelchair, bedside commode, etc,.)?: Unable Help needed moving to and from a bed to chair  (including a wheelchair)?: Total Help needed walking in hospital room?: Total Help needed climbing 3-5 steps with a railing? : Total 6 Click Score: 6    End of Session Equipment Utilized During Treatment: Gait belt Activity Tolerance: Patient limited by fatigue Patient left: in bed;with call bell/phone within reach;with nursing/sitter in room Nurse Communication: Mobility status PT Visit Diagnosis: Unsteadiness on feet (R26.81);Muscle weakness (generalized) (M62.81);Other symptoms and signs involving the nervous system (R29.898);Difficulty in walking, not elsewhere classified (R26.2)     Time: 5830-9407 PT Time Calculation (min) (ACUTE ONLY): 15 min  Charges:  $Therapeutic Activity: 8-22 mins                    G Codes:       Lewis Shock, PT, DPT Pager #: [phone] Office #: [phone]    Chasen Mendell M Lavada Langsam 01/24/2017, 3:29 PM

## 2017-01-25 LAB — GLUCOSE, CAPILLARY
GLUCOSE-CAPILLARY: 227 mg/dL — AB (ref 65–99)
GLUCOSE-CAPILLARY: 310 mg/dL — AB (ref 65–99)
Glucose-Capillary: 250 mg/dL — ABNORMAL HIGH (ref 65–99)
Glucose-Capillary: 248 mg/dL — ABNORMAL HIGH (ref 65–99)
Glucose-Capillary: 230 mg/dL — ABNORMAL HIGH (ref 65–99)

## 2017-01-25 MED ORDER — INSULIN ASPART 100 UNIT/ML ~~LOC~~ SOLN
8.0000 [IU] | Freq: Three times a day (TID) | SUBCUTANEOUS | Status: DC
  Administered 2017-01-25 – 2017-01-27 (×6): 8 [IU] via SUBCUTANEOUS

## 2017-01-25 NOTE — Progress Notes (Signed)
Inpatient Diabetes Program Recommendations  AACE/ADA: New Consensus Statement on Inpatient Glycemic Control (2015)  Target Ranges:  Prepandial:   less than 140 mg/dL      Peak postprandial:   less than 180 mg/dL (1-2 hours)      Critically ill patients:  140 - 180 mg/dL   Lab Results  Component Value Date   GLUCAP 230 (H) 01/25/2017   HGBA1C 11.4 (H) 12/23/2016   Diabetes history: Type 1 DM Outpatient Diabetes medications: Novolog 0-15 units tid with meals, Novolog 8 units tid with meals, Lantus 15 units bid Current orders for Inpatient glycemic control:  Novolog sensitive tid with meals and HS, Novolog 7 units tid with meals Lantus 30 units q HS  Inpatient Diabetes Program Recommendations:  Consider increasing Novolog meal coverage to 8 units tid with meals.   Thanks, Beryl Meager, RN, BC-ADM Inpatient Diabetes Coordinator Pager [phone] (8a-5p)

## 2017-01-25 NOTE — Progress Notes (Signed)
   Subjective:  No acute events overnight, no episodes of hypoglycemia, though blood sugars in 200s consistently.  This morning, the patient has no specific complaints, continues to endorse depressed mood with suicidal ideation.  Placement has been complicated by barriers regarding MS maintenance therapy.  Objective:  Vital signs in last 24 hours: Vitals:   01/25/17 0100 01/25/17 0640 01/25/17 0900 01/25/17 1345  BP: 132/68 (!) 166/89 (!) 143/63 (!) 160/80  Pulse: 74 71 85 72  Resp: 18 18 18    Temp: 98.8 F (37.1 C) 98.6 F (37 C) 98.9 F (37.2 C) 98.9 F (37.2 C)  TempSrc: Oral Oral Oral   SpO2: 99% 100% 100% 100%  Weight:  150 lb 2.1 oz (68.1 kg)    Height:       General: Resting in bed comfortably, no acute distress , sitter at bedside  CV: normal rate and rhythm, systolic murmur  Resp: Normal work of breathing, clear anterior breath sounds Abd: Soft, non-tender, +BS  Extr: No LE edema  Neuro: Alert and oriented x3 GU: Foley remains in place  Psych: Continued flat affect   Assessment/Plan:  Bilateral Leg Pain  Pt reports bilateral burning and aching leg pain, likely a result of her multiple sclerosis and diabetes. She is on both cymbalta, lyrica (gabapentin not effective), which are ideal for neuropathic type pain though this pain is difficult to control. Also complaining of spasm type pain. Will continue tylenol as well, tramadol available prn.  --Cont Lyrica 100 TID --Cont baclofen 5 mg PO TID   Multiple Sclerosis, potential NMO  Patient initially admitted for MS flare with concern for NMO and has completed plasmapheresis with improvement in deficits. Neurology consulted and followed for the course of MS tx.  Per neurology, will need outpatient follow-up to initiate maintenance therapy.   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 Continues to express suicidal ideation.   Psych consulted here, appreciate updated recommendations. SW working to find psychiatric placement following medical management.  --Continue Cymbalta 60 mg, Remeron increased to 45 mg qhs  --Inpatient psych admission following medical stabilization  H/o Diabetes  CBGs have been variable throughout the course of admission with both hyper and hypoglycemia at various points depending on steroids, oral intake, etc with various changes made throughout admission.  Patient has had up/down glucose values, will attempt to monitor closely to stabilize at an acceptable range. --Cont Lantus 30 U qhs, Novolog to 8 U TID AC --Cont SSI, night coverage    Dispo: Anticipated discharge pending disposition issues.   [name], MD 01/25/2017, 2:34 PM

## 2017-01-26 DIAGNOSIS — E118 Type 2 diabetes mellitus with unspecified complications: Secondary | ICD-10-CM

## 2017-01-26 LAB — GLUCOSE, CAPILLARY
GLUCOSE-CAPILLARY: 169 mg/dL — AB (ref 65–99)
GLUCOSE-CAPILLARY: 262 mg/dL — AB (ref 65–99)
Glucose-Capillary: 197 mg/dL — ABNORMAL HIGH (ref 65–99)
Glucose-Capillary: 159 mg/dL — ABNORMAL HIGH (ref 65–99)

## 2017-01-26 MED ORDER — METOPROLOL SUCCINATE ER 100 MG PO TB24
100.0000 mg | ORAL_TABLET | Freq: Every day | ORAL | Status: DC
  Administered 2017-01-27 – 2017-02-14 (×19): 100 mg via ORAL
  Filled 2017-01-26 (×19): qty 1

## 2017-01-26 NOTE — Progress Notes (Signed)
Physical Therapy Treatment Patient Details Name: Debra [name] MRN: [id number] DOB: 1965-08-17 Today's Date: 01/26/2017    History of Present Illness Pt is a 51 y/o female admitted to [facility] secondary to MS exacerbation and major depressive disorder. Pt transferred to [facility] for insertion of R IJ catheter and plasmophoresis treatments. Pt also very depressed about recent decline in function and psychiatry following. Pt only completed 4/5 PLEX and was discontinued (possible infection acquired). PMH includes MS, HTN, DM with neuropathy, depression, heart murmur, and restless leg syndrome.     PT Comments    Patient's session limited today secondary to drop in BP to 76/43 after standing bouts using stedy. Pt became diaphoretic with reports of dizziness. RN notified.  Prior to this, tolerated standing bouts x2 using stedy and assist of 2. Stood for ~1 min and then 20 sec with Bil knees buckling. Continues to demonstrate marked weakness in BLEs and trunk. Encouraged exercises while in bed and sitting in chair a few times per day. Will continue to follow.   Follow Up Recommendations  CIR     Equipment Recommendations  None recommended by PT    Recommendations for Other Services       Precautions / Restrictions Precautions Precautions: Fall Precaution Comments: suicide precautions; watch BP Restrictions Weight Bearing Restrictions: No    Mobility  Bed Mobility Overal bed mobility: Needs Assistance Bed Mobility: Rolling;Sidelying to Sit Rolling: Min guard Sidelying to sit: Min guard;HOB elevated     Sit to sidelying: Mod assist General bed mobility comments: Able to roll towards left side, use UEs tobring LES off bed and push into sidelying without assist; increased time. Assist to bring LES to return to supine.   Transfers Overall transfer level: Needs assistance Equipment used: 2 person hand held assist Transfers: Sit to/from Stand Sit to Stand: Max assist         General  transfer comment: Completed 2 STS in stedy today. Used bed pad under pt to assist with powering up due to extremely weak LEs. + dizziness. BP dropped to pt returned to supine.   Ambulation/Gait                 Stairs            Wheelchair Mobility    Modified Rankin (Stroke Patients Only)       Balance Overall balance assessment: Needs assistance Sitting-balance support: Feet supported;Single extremity supported Sitting balance-Leahy Scale: Fair Sitting balance - Comments: Able to sit without UE support and weightshift to right/left wihtout LOB but close Min guard needed for safety as pt's trunk unpredictable.   Standing balance support: During functional activity;Bilateral upper extremity supported Standing balance-Leahy Scale: Zero Standing balance comment: Pt stands heavily reliant on UEs on Stedy with knees blocked mod +2; Able to stand for ~1 min and then 20 sec.                             Cognition Arousal/Alertness: Awake/alert Behavior During Therapy: Flat affect Overall Cognitive Status: Within Functional Limits for tasks assessed                                        Exercises General Exercises - Lower Extremity Straight Leg Raises: AAROM;Both;10 reps;Supine    General Comments General comments (skin integrity, edema, etc.): Pt became diaphoretic with reports  of dizziness. BP 76/43. RN notified.       Pertinent Vitals/Pain Pain Assessment: No/denies pain    Home Living                      Prior Function            PT Goals (current goals can now be found in the care plan section) Progress towards PT goals: Not progressing toward goals - comment(secondary to drop in BP and dizziness.)    Frequency    Min 4X/week      PT Plan Current plan remains appropriate    Co-evaluation              AM-PAC PT "6 Clicks" Daily Activity  Outcome Measure  Difficulty turning over in bed (including  adjusting bedclothes, sheets and blankets)?: A Little Difficulty moving from lying on back to sitting on the side of the bed? : Unable Difficulty sitting down on and standing up from a chair with arms (e.g., wheelchair, bedside commode, etc,.)?: Unable Help needed moving to and from a bed to chair (including a wheelchair)?: Total Help needed walking in hospital room?: Total Help needed climbing 3-5 steps with a railing? : Total 6 Click Score: 8    End of Session Equipment Utilized During Treatment: Gait belt Activity Tolerance: Treatment limited secondary to medical complications (Comment)(drop in BP) Patient left: in bed;with call bell/phone within reach;with nursing/sitter in room Nurse Communication: Mobility status;Other (comment)(BP response to exercise) PT Visit Diagnosis: Unsteadiness on feet (R26.81);Muscle weakness (generalized) (M62.81);Other symptoms and signs involving the nervous system (R29.898);Difficulty in walking, not elsewhere classified (R26.2)     Time: 2979-8921 PT Time Calculation (min) (ACUTE ONLY): 24 min  Charges:  $Therapeutic Activity: 23-37 mins                    G Codes:       Debra [name], PT, DPT [phone]     Debra [name] 01/26/2017, 1:58 PM

## 2017-01-26 NOTE — Progress Notes (Signed)
Inpatient Diabetes Program Recommendations  AACE/ADA: New Consensus Statement on Inpatient Glycemic Control (2015)  Target Ranges:  Prepandial:   less than 140 mg/dL      Peak postprandial:   less than 180 mg/dL (1-2 hours)      Critically ill patients:  140 - 180 mg/dL   Lab Results  Component Value Date   GLUCAP 197 (H) 01/26/2017   HGBA1C 11.4 (H) 12/23/2016    Review of Glycemic Control Results for JENAYAH, SONNEK (MRN [id number]) as of 01/26/2017 09:39  Ref. Range 01/24/2017 22:23 01/25/2017 06:36 01/25/2017 08:12 01/25/2017 11:22 01/25/2017 16:36 01/25/2017 22:01 01/26/2017 06:11  Glucose-Capillary Latest Ref Range: 65 - 99 mg/dL 119 (H) 147 (H) 829 (H) 230 (H) 227 (H) 310 (H) 197 (H)   Diabetes history: Type 1 DM Outpatient Diabetes medications: Novolog 0-15 units tid with meals, Novolog 8 units tid with meals, Lantus 15 units bid Current orders for Inpatient glycemic control: Novolog 0-5 Units HS, Novolog 0-9 Units Sensitive correction TID, Novolog 8 Units TID with meals, Lantus 30 Units daily HS.   Inpatient Diabetes Program Recommendations:   Given increased HS blood sugars of 310 & 321 respectively, consider splitting Lantus dosing to 15 Units at breakfast and 15 Units at HS.  Thanks, Lujean Rave, MSN, RNC-OB Diabetes Coordinator [phone] (8a-5p)

## 2017-01-26 NOTE — Progress Notes (Signed)
   Subjective:  No acute events overnight, patient states she is doing okay overall, notes some leg pain this morning.  Her blood glucose has improved, 159 most recently with no hypoglycemic events.  States her vision and lower extremity weakness have remained stable.  Objective:  Vital signs in last 24 hours: Vitals:   01/25/17 1745 01/25/17 2203 01/26/17 0041 01/26/17 0600  BP: 135/68 (!) 157/76 (!) 148/78 (!) 165/80  Pulse: 85 73 74 72  Resp: 18 16 18 18   Temp: 97.7 F (36.5 C) 98.2 F (36.8 C) 98.8 F (37.1 C) 98.9 F (37.2 C)  TempSrc: Oral Oral Oral Oral  SpO2: 100% 100% 97% 100%  Weight:    146 lb 13.2 oz (66.6 kg)  Height:       General: Resting in bed comfortably, no acute distress , sitter at bedside  CV: normal rate and rhythm, systolic murmur  Resp: Normal work of breathing, clear anterior breath sounds Abd: Soft, non-tender, +BS  Extr: No LE edema  Neuro: Alert and oriented x3 GU: Foley remains in place  Psych: Continued flat affect   Assessment/Plan:  Major Depressive Disorder Patient has a history of severe depression with psychotic features which is required inpatient psychiatric admission. Continues to express suicidal ideation.  Psych consulted here, appreciate updated recommendations. SW working to find psychiatric placement following medical management which is complicated by her multiple sclerosis.  --Continue Cymbalta 60 mg, Remeron 45 mg qhs  --Inpatient psych admission following medical stabilization   Multiple Sclerosis, potential NMO  Patient initially admitted for MS flare with concern for NMO and has completed plasmapheresis with improvement in deficits. Neurology consulted and followed for the course of MS tx.  Per neurology, will need outpatient follow-up to initiate maintenance therapy.   H/o Diabetes  CBGs have been variable throughout the course of admission with both hyper and hypoglycemia at various points depending on steroids, oral intake,  etc with various changes made throughout admission.  No hypoglycemia in past ~48hrs with recent values trending toward and within acceptable range.  --Cont Lantus 30 U qhs, Novolog to 8 U TID AC --Cont SSI, night coverage    Dispo: Anticipated discharge pending disposition issues.   Debra Carne, MD 01/26/2017, 7:09 AM

## 2017-01-26 NOTE — Care Management Note (Signed)
Case Management Note  Patient Details  Name: Debra [name] MRN: [id number] Date of Birth: 12/24/1965  Subjective/Objective:                    Action/Plan: Continuing to await bed at [facility]. CM following.  Expected Discharge Date:                  Expected Discharge Plan:  IP Rehab Facility  In-House Referral:  Clinical Social Work  Discharge planning Services  CM Consult  Post Acute Care Choice:    Choice offered to:     DME Arranged:    DME Agency:     HH Arranged:    HH Agency:     Status of Service:  In process, will continue to follow  If discussed at Long Length of Stay Meetings, dates discussed:  01/25/2017  Additional Comments:  Kermit Balo, RN 01/26/2017, 3:35 PM

## 2017-01-27 DIAGNOSIS — E108 Type 1 diabetes mellitus with unspecified complications: Secondary | ICD-10-CM

## 2017-01-27 LAB — GLUCOSE, CAPILLARY
GLUCOSE-CAPILLARY: 197 mg/dL — AB (ref 65–99)
GLUCOSE-CAPILLARY: 254 mg/dL — AB (ref 65–99)
GLUCOSE-CAPILLARY: 232 mg/dL — AB (ref 65–99)
GLUCOSE-CAPILLARY: 207 mg/dL — AB (ref 65–99)

## 2017-01-27 MED ORDER — INSULIN ASPART 100 UNIT/ML ~~LOC~~ SOLN
10.0000 [IU] | Freq: Three times a day (TID) | SUBCUTANEOUS | Status: DC
  Administered 2017-01-27 – 2017-01-31 (×13): 10 [IU] via SUBCUTANEOUS

## 2017-01-27 NOTE — Progress Notes (Signed)
   Subjective:  No acute events overnight, patient states she is feeling pretty well today.  She notes some leg pain this morning.  States she is about 90% back to her normal vision and feels her strength continues to improve in her LE.    Objective:  Vital signs in last 24 hours: Vitals:   01/26/17 2127 01/27/17 0707 01/27/17 0937 01/27/17 1336  BP: (!) 148/72 (!) 148/77 (!) 145/70 (!) 109/49  Pulse: 82 72 75 83  Resp: 18 18 18 18   Temp: 98.2 F (36.8 C) 98.4 F (36.9 C) 99 F (37.2 C) 98.8 F (37.1 C)  TempSrc: Oral Oral Oral Oral  SpO2: 100% 100% 100% 100%  Weight:      Height:       General: Resting in bed comfortably, no acute distress , sitter at bedside  CV: normal rate and rhythm, 2/6 systolic murmur  Resp: Normal work of breathing, clear anterior breath sounds Abd: Soft, non-tender, +BS  Extr: No LE edema  Neuro: Alert and oriented x3, able to move Lower extremities slightly, R>L, improved from initial presentation GU: Foley remains in place  Psych: mood stable   Assessment/Plan:  Major Depressive Disorder Patient has a history of severe depression with psychotic features which is required inpatient psychiatric admission. Continues to express suicidal ideation.  Psych consulted here, appreciate updated recommendations. SW working to find psychiatric placement following medical management which is complicated by her multiple sclerosis.  --Continue Cymbalta 60 mg, Remeron 45 mg qhs  --Inpatient psych admission following medical stabilization   Multiple Sclerosis, potential NMO  Patient initially admitted for MS flare with concern for NMO and has completed plasmapheresis with improvement in deficits. Neurology consulted and followed for the course of MS tx.  Per neurology, will need outpatient follow-up to initiate maintenance therapy.   T1DM  CBGs have been variable throughout the course of admission with both hyper and hypoglycemia at various points depending on  steroids, oral intake, etc with various changes made throughout admission.  No hypoglycemia in past ~48hrs with recent values trending toward and within acceptable range.  --Cont Lantus 30 U qhs, increased Novolog to 10 U TID AC --Cont SSI, night coverage    Dispo: Anticipated discharge pending disposition issues.   Angelita Ingles, MD 01/27/2017, 2:09 PM

## 2017-01-28 LAB — GLUCOSE, CAPILLARY
GLUCOSE-CAPILLARY: 262 mg/dL — AB (ref 65–99)
GLUCOSE-CAPILLARY: 185 mg/dL — AB (ref 65–99)
Glucose-Capillary: 318 mg/dL — ABNORMAL HIGH (ref 65–99)
Glucose-Capillary: 118 mg/dL — ABNORMAL HIGH (ref 65–99)

## 2017-01-28 NOTE — Progress Notes (Signed)
   Subjective:  Patient states vision is stable, LE strength is stable. She continues to endorse SI. She denies chest pain, shortness of breath.  Objective:  Vital signs in last 24 hours: Vitals:   01/28/17 0459 01/28/17 0800 01/28/17 1200 01/28/17 1619  BP: (!) 162/80 119/61 122/63 135/62  Pulse: 73 75 86 79  Resp: 18 16 16 16   Temp: 98.5 F (36.9 C) 98.5 F (36.9 C) 98.9 F (37.2 C) 98.8 F (37.1 C)  TempSrc: Oral Oral Oral Oral  SpO2: 100% 100% 100% 99%  Weight: 149 lb 14.6 oz (68 kg)     Height:       Constitutional: NAD CV: RRR, no m/r/g Resp: CTAB, no increased work of breathing Abd: soft, NDNT MSK, neuro: Sensation intact throughout, UE strength intact, RLE 2/5 strength, LLE 4/5  Assessment/Plan:  Principal Problem:   Major depressive disorder, single episode, severe without psychosis (HCC) Active Problems:   Multiple sclerosis (HCC)  MDD with persistent suicidal ideation: Unchanged.  --Continue Cymbalta 60mg  daily, and Remeron 45mg  qhs --Pending placement  MS, potential NMO: This was presenting problem initially. S/p treatment but not on maintenance therapy which is hindering psych placement esp since patient was admitted from psych to [facility] initially due to MS flare.   T1DM: --Lantus 30 units qhs, novolog 10 units TID ac --SSI  Dispo: Anticipated discharge pending inpatient psychiatry placement.   Nyra Market, MD 01/28/2017, 5:33 PM

## 2017-01-29 LAB — GLUCOSE, CAPILLARY
GLUCOSE-CAPILLARY: 217 mg/dL — AB (ref 65–99)
GLUCOSE-CAPILLARY: 241 mg/dL — AB (ref 65–99)
Glucose-Capillary: 334 mg/dL — ABNORMAL HIGH (ref 65–99)
Glucose-Capillary: 161 mg/dL — ABNORMAL HIGH (ref 65–99)

## 2017-01-29 MED ORDER — INSULIN ASPART 100 UNIT/ML ~~LOC~~ SOLN
0.0000 [IU] | Freq: Three times a day (TID) | SUBCUTANEOUS | Status: DC
  Administered 2017-01-29: 5 [IU] via SUBCUTANEOUS
  Administered 2017-01-30: 2 [IU] via SUBCUTANEOUS
  Administered 2017-01-30: 11 [IU] via SUBCUTANEOUS
  Administered 2017-01-30: 8 [IU] via SUBCUTANEOUS
  Administered 2017-01-30 – 2017-01-31 (×2): 3 [IU] via SUBCUTANEOUS

## 2017-01-29 NOTE — Progress Notes (Signed)
   Subjective:  Patient states vision is stable, LE strength is stable. Mood is stable today. She denies chest pain, shortness of breath.  Objective:  Vital signs in last 24 hours: Vitals:   01/29/17 0539 01/29/17 0600 01/29/17 0700 01/29/17 1023  BP: (!) 194/90 (!) 189/89 (!) 177/65 (!) 146/76  Pulse: 85   80  Resp: 18   16  Temp: 98.9 F (37.2 C)   98 F (36.7 C)  TempSrc: Oral   Oral  SpO2: 100%   96%  Weight: 149 lb 14.6 oz (68 kg)     Height:       Constitutional: NAD CV: RRR, 2/6 systolic murmur Resp: CTAB, no increased work of breathing Abd: soft, NDNT MSK, neuro: Sensation intact throughout, UE strength intact, RLE 2/5 strength, LLE 4/5  Assessment/Plan:  Principal Problem:   Major depressive disorder, single episode, severe without psychosis (HCC) Active Problems:   Multiple sclerosis (HCC)  MDD with persistent suicidal ideation: Unchanged.  --Continue Cymbalta 60mg  daily, and Remeron 45mg  qhs --Pending placement  MS, potential NMO: This was presenting problem initially. S/p treatment but not on maintenance therapy which is hindering psych placement esp since patient was admitted from psych to [facility] initially due to MS flare.   T1DM: --Lantus 30 units qhs, novolog 10 units TID ac --SSI increased to moderate  Dispo: Anticipated discharge pending inpatient psychiatry placement.   Angelita Ingles, MD 01/29/2017, 12:55 PM

## 2017-01-29 NOTE — Progress Notes (Signed)
Occupational Therapy Treatment Patient Details Name: Debra [name] MRN: [id number] DOB: 1965/04/08 Today's Date: 01/29/2017    History of present illness Pt is a 51 y/o female admitted to [facility] secondary to MS exacerbation and major depressive disorder. Pt transferred to [facility] for insertion of R IJ catheter and plasmophoresis treatments. Pt also very depressed about recent decline in function and psychiatry following. Pt only completed 4/5 PLEX and was discontinued (possible infection acquired). PMH includes MS, HTN, DM with neuropathy, depression, heart murmur, and restless leg syndrome.    OT comments  Pt participated in therapeutic activity today during OT in preparation for increased participation with ADL's, fuctional transfers and self-care tasks. Focused on bed mobility, sitting EOB, trunk/balance in sitting. Pt should benefit from sitting up EOB for basic grooming/ADL's next session as able.   Follow Up Recommendations  SNF;Supervision/Assistance - 24 hour    Equipment Recommendations  None recommended by OT    Recommendations for Other Services Rehab consult    Precautions / Restrictions Precautions Precautions: Fall Precaution Comments: suicide precautions; watch BP       Mobility Bed Mobility Overal bed mobility: Needs Assistance Bed Mobility: Sidelying to Sit;Rolling;Sit to Sidelying Rolling: Min guard Sidelying to sit: HOB elevated;Min assist;Mod assist   Sit to supine: Min assist   General bed mobility comments: Able to roll towards left side, required Min A to bring legs off bed and Min A at trunk to push into sitting EOB; increased time. Assist to bring LES to return to supine., pt used rails to pull herself up with HOB flat - no physical assist.   Transfers                      Balance Overall balance assessment: Needs assistance Sitting-balance support: Feet supported;Single extremity supported;Bilateral upper extremity supported;No upper  extremity supported Sitting balance-Leahy Scale: Poor Sitting balance - Comments: Sit EOB with bilateral UE support w/ fair grade. When attempted to sit EOB w/o UE assist, pt had LOB/poor after ~25 seconds, was albe to self correct by reaching for EOB w/ bilateral UE's. Overall close Min A as her trunk is unpredictable                                   ADL either performed or assessed with clinical judgement   ADL Overall ADL's : Needs assistance/impaired Eating/Feeding: Sitting;Supervision/ safety                                     General ADL Comments: Pt reported that she already performed ADL's early this morning and required assistance. She was agreeable to OT ADL retraining session to consist of bed mobility, sitting up at EOB in preparation for increased participation with ADL's. Bed mobility was overall Min A for LE's and Mod A for trunk, HOB was elvevated upon OT arrival. Once sitting up, pt was able to maintain sitting balance with UE support on bed for ~ 10 min. Pt attempted sitting EOB w/o UE support at EOB and was noted to lose her balance after ~ 25 seconds. She self corrected by placing her hands back down on bed. Discussed attempting ADL's in sitting in future to work on balance and activity tolerance, pt verbalized understanding of this. Pt was assisted back to bed with Min A LE's, and she was  able to reach for rails and pull herself up w/ HOB flat.      Vision Baseline Vision/History: Wears glasses Wears Glasses: At all times     Perception     Praxis      Cognition Arousal/Alertness: Awake/alert Behavior During Therapy: Flat affect Overall Cognitive Status: Within Functional Limits for tasks assessed                                          Exercises     Shoulder Instructions       General Comments      Pertinent Vitals/ Pain       Pain Assessment: No/denies pain Faces Pain Scale: No hurt  Home Living                                           Prior Functioning/Environment              Frequency  Min 2X/week        Progress Toward Goals  OT Goals(current goals can now be found in the care plan section)  Progress towards OT goals: Progressing toward goals  Acute Rehab OT Goals Patient Stated Goal: Get stronger   Plan Discharge plan remains appropriate    Co-evaluation                 AM-PAC PT "6 Clicks" Daily Activity     Outcome Measure   Help from another person eating meals?: A Little Help from another person taking care of personal grooming?: A Little Help from another person toileting, which includes using toliet, bedpan, or urinal?: Total Help from another person bathing (including washing, rinsing, drying)?: A Lot Help from another person to put on and taking off regular upper body clothing?: A Lot Help from another person to put on and taking off regular lower body clothing?: Total 6 Click Score: 12    End of Session    OT Visit Diagnosis: Other abnormalities of gait and mobility (R26.89);Muscle weakness (generalized) (M62.81);Low vision, both eyes (H54.2)   Activity Tolerance Patient tolerated treatment well   Patient Left in bed;with call bell/phone within reach;with nursing/sitter in room   Nurse Communication Mobility status        Time: 2330-0762 OT Time Calculation (min): 15 min  Charges: OT General Charges $OT Visit: 1 Visit OT Treatments $Therapeutic Activity: 8-22 mins   Keyshun Elpers Beth Dixon, OTR/L 01/29/2017, 10:01 AM

## 2017-01-29 NOTE — Progress Notes (Signed)
Inpatient Diabetes Program Recommendations  AACE/ADA: New Consensus Statement on Inpatient Glycemic Control (2015)  Target Ranges:  Prepandial:   less than 140 mg/dL      Peak postprandial:   less than 180 mg/dL (1-2 hours)      Critically ill patients:  140 - 180 mg/dL   Lab Results  Component Value Date   GLUCAP 334 (H) 01/29/2017   HGBA1C 11.4 (H) 12/23/2016    Review of Glycemic Control  Diabetes history: Type 1 DM Outpatient Diabetes medications:Novolog 0-15 units tid with meals, Novolog 8 units tid with meals, Lantus 15 units bid Current orders for Inpatient glycemic control: Novolog sensitive tid with meals and HS, Novolog 7 units tid with meals Lantus 30 units q HS   Inpatient Diabetes Program Recommendations:  Given increased fastings, may consider increasing Lantus to 32 Units HS. Would also recommend placing order for a 0200 am blood sugar given fastings are >250 mg/dL.  Thanks,  Lujean Rave, MSN, RNC-OB Diabetes Coordinator [phone] (8a-5p)

## 2017-01-29 NOTE — Progress Notes (Signed)
Physical Therapy Treatment Patient Details Name: Debra [name] MRN: [id number] DOB: September 14, 1965 Today's Date: 01/29/2017    History of Present Illness Pt is a 51 y/o female admitted to [facility] secondary to MS exacerbation and major depressive disorder. Pt transferred to [facility] for insertion of R IJ catheter and plasmophoresis treatments. Pt also very depressed about recent decline in function and psychiatry following. Pt only completed 4/5 PLEX and was discontinued (possible infection acquired). PMH includes MS, HTN, DM with neuropathy, depression, heart murmur, and restless leg syndrome.     PT Comments    Pt performed sit<>stand x3 with steady. She maintained standing position w/o assist for 20 seconds before requiring seated rest break. Tactile cues given at hips and chest to achieve fully upright posture. Stretches performed in seated to lengthen hamstrings and pt left with LEs on pillow to encourage knee extension. She would benefit from continued skilled PT to to increase functional independence and safety with mobility. Will continue to follow acutely.    Follow Up Recommendations  CIR     Equipment Recommendations  None recommended by PT    Recommendations for Other Services Rehab consult     Precautions / Restrictions Precautions Precautions: Fall Precaution Comments: suicide precautions; watch BP Restrictions Weight Bearing Restrictions: No    Mobility  Bed Mobility Overal bed mobility: Needs Assistance Bed Mobility: Supine to Sit     Supine to sit: Min assist     General bed mobility comments: Pt required min A for LE management off EOB. Able to elevate torso w/o assist. Increased time and use of bed rails required.  Transfers Overall transfer level: Needs assistance   Transfers: Sit to/from Stand Sit to Stand: Min assist;+2 physical assistance         General transfer comment: Completed 3 sit<>stand with steady. pt able to remain standing for 20 seconds  each time before requiring seated rest break. Tactile given at hips and chest to encourage fully upright posture.  Ambulation/Gait             General Gait Details: pt unable   Stairs            Wheelchair Mobility    Modified Rankin (Stroke Patients Only)       Balance Overall balance assessment: Needs assistance Sitting-balance support: Feet supported;Single extremity supported;Bilateral upper extremity supported;No upper extremity supported Sitting balance-Leahy Scale: Poor     Standing balance support: During functional activity;Bilateral upper extremity supported Standing balance-Leahy Scale: Zero Standing balance comment: Pt stands heavily reliant on UEs on Stedy with knees blocked min a +2                            Cognition Arousal/Alertness: Awake/alert Behavior During Therapy: Flat affect Overall Cognitive Status: Within Functional Limits for tasks assessed                                        Exercises General Exercises - Lower Extremity Long Arc Quad: Both;Seated;AAROM;10 reps Other Exercises Other Exercises: hamstring stretch in recliner; legs elevated and flexion at hips; 3x 15 seconds    General Comments        Pertinent Vitals/Pain Pain Assessment: No/denies pain    Home Living                      Prior  Function            PT Goals (current goals can now be found in the care plan section) Acute Rehab PT Goals Patient Stated Goal: Get stronger  PT Goal Formulation: With patient Time For Goal Achievement: 01/23/17 Potential to Achieve Goals: Fair Progress towards PT goals: Progressing toward goals    Frequency    Min 4X/week      PT Plan Current plan remains appropriate    Co-evaluation              AM-PAC PT "6 Clicks" Daily Activity  Outcome Measure  Difficulty turning over in bed (including adjusting bedclothes, sheets and blankets)?: A Little Difficulty moving from  lying on back to sitting on the side of the bed? : Unable Difficulty sitting down on and standing up from a chair with arms (e.g., wheelchair, bedside commode, etc,.)?: Unable Help needed moving to and from a bed to chair (including a wheelchair)?: Total Help needed walking in hospital room?: Total Help needed climbing 3-5 steps with a railing? : Total 6 Click Score: 8    End of Session Equipment Utilized During Treatment: Gait belt Activity Tolerance: Patient tolerated treatment well(drop in BP) Patient left: with call bell/phone within reach;with nursing/sitter in room;in chair Nurse Communication: Mobility status;Other (comment) PT Visit Diagnosis: Unsteadiness on feet (R26.81);Muscle weakness (generalized) (M62.81);Other symptoms and signs involving the nervous system (R29.898);Difficulty in walking, not elsewhere classified (R26.2)     Time: [id number] PT Time Calculation (min) (ACUTE ONLY): 21 min  Charges:  $Neuromuscular Re-education: 8-22 mins                    G Codes:      Kallie LocksHannah Yanessa Hocevar, [place] Pager [phone] Acute Rehab   Sheral ApleyHannah E Kiaja Shorty 01/29/2017, 3:08 PM

## 2017-01-30 LAB — GLUCOSE, CAPILLARY
GLUCOSE-CAPILLARY: 174 mg/dL — AB (ref 65–99)
Glucose-Capillary: 263 mg/dL — ABNORMAL HIGH (ref 65–99)
Glucose-Capillary: 193 mg/dL — ABNORMAL HIGH (ref 65–99)
Glucose-Capillary: 124 mg/dL — ABNORMAL HIGH (ref 65–99)
Glucose-Capillary: 350 mg/dL — ABNORMAL HIGH (ref 65–99)

## 2017-01-30 NOTE — Progress Notes (Signed)
   Subjective:  Patient states vision is still around 90%, LE strength continues to slowly improve. Mood is stable today. She denies chest pain, shortness of breath.  Continues to endorse some lower extremity discomfort.  Objective:  Vital signs in last 24 hours: Vitals:   01/29/17 2022 01/30/17 0100 01/30/17 0415 01/30/17 0815  BP: (!) 156/77 (!) 163/79 139/69 133/63  Pulse: 75 79 70 72  Resp: 16 18 17 18   Temp: 98.5 F (36.9 C) 98.8 F (37.1 C) 98 F (36.7 C) 97.6 F (36.4 C)  TempSrc: Oral Oral Oral Oral  SpO2: 100% 100% 100% 96%  Weight:   150 lb 5.7 oz (68.2 kg)   Height:       Constitutional: NAD CV: RRR, 2/6 systolic murmur Resp: CTAB, no increased work of breathing Abd: soft, NDNT MSK, neuro: Sensation intact throughout, UE strength intact, RLE 2/5 strength, LLE 4/5  Assessment/Plan:  Principal Problem:   Major depressive disorder, single episode, severe without psychosis (HCC) Active Problems:   Multiple sclerosis (HCC)  MDD with persistent suicidal ideation: Unchanged.  --Continue Cymbalta 60mg  daily, and Remeron 45mg  qhs --Pending placement  MS, potential NMO: This was presenting problem initially. S/p treatment but not on maintenance therapy which is hindering psych placement esp since patient was admitted from psych to [facility] initially due to MS flare.  --neurology re consulted yesterday will see pt today  T1DM: --Lantus 30 units qhs, novolog 10 units TID ac --SSI  moderate  Dispo: Anticipated discharge pending neuro recommendations and inpatient psychiatry placement.   Angelita Ingles, MD 01/30/2017, 10:33 AM

## 2017-01-30 NOTE — Progress Notes (Signed)
Interim note  Neurology was called  as patient will not be discharge home due to suicidal ideation, but instead go to inpatient psych.  Therefore, we will need to start disease modifying therapy as inpatient rather than outpatient.  From my experience,these are expensive mediations and usually require prior authorization.  Will co-ordinate with Dr Bonnita Hollow office to start either Tecfidera or Ocrevus.    Debra [name] Azizah Lisle MD [facility] [phone]  If 7pm to 7am, please call on call as listed on AMION.

## 2017-01-30 NOTE — Progress Notes (Signed)
CSW following to facilitate discharge plan. CSW contacted [facility] admissions office to ensure that patient was still on the list, and what was needed in order to further the referral. CSW spoke with [facility] in admissions, who admitted that he was familiar with the name of the patient but could not find her paperwork anywhere; indicated that it was probably thrown out. CSW completed verbal portion of the referral with [facility] over the phone again to initiate referral for a third time.  CSW contacted after hanging up by [facility] in admissions that the patient's paperwork was still on file, she was keeping it so that it didn't get thrown out again, and updated that they are still awaiting neurology recommendations. CSW provided [place] with update received from CSW covering last week and RNCM that neurologist was contacted but did not provide additional recommendations; MD is supposed to reconsult. Britta Mccreedy indicated that they are keeping the patient's information on file, but cannot do anything until they receive additional recommendations from neurology on treatment for MS. Britta Mccreedy explained that they need more specifics because the hospital does not have neurology on staff.   CSW will continue to follow.  Blenda Nicely, [place] Clinical Social Worker [phone]

## 2017-01-30 NOTE — Consult Note (Signed)
[facility] Psych Consult Progress Note  01/30/2017 10:16 PM Debra [name]  MRN:  [id number] Subjective:   Debra [name] reports that she is doing okay although she continues to have suicidal thoughts without a plan. She reports sleeping and eating okay and denies somatic complaints today. She does not feel like she has had any improvement in her strength but she continues to work with PT. She denies any problems with increasing Remeron.   Principal Problem: Major depressive disorder, single episode, severe without psychosis (HCC) Diagnosis:   Patient Active Problem List   Diagnosis Date Noted  . Major depressive disorder, single episode, severe without psychosis (HCC) [F32.2] 01/02/2017  . Multiple sclerosis exacerbation (HCC) [G35] 01/01/2017  . Dysthymia [F34.1] 12/27/2016  . Major depressive disorder, recurrent, severe with psychotic features (HCC) [F33.3]   . Multiple joint pain [M25.50]   . HTN (hypertension) [I10]   . Acute pain of left knee [M25.562]   . Neurogenic bladder [N31.9]   . Acute lower UTI [N39.0]   . Labile blood pressure [R09.89]   . Acute blood loss anemia [D62]   . Leukocytosis [D72.829]   . Labile blood glucose [R73.09]   . Hypertensive crisis [I16.9]   . Hypoglycemia [E16.2]   . Poorly controlled type 2 diabetes mellitus with peripheral neuropathy (HCC) [E11.42, E11.65]   . Uncontrolled hypertension [I10]   . Sleep disturbance [G47.9]   . Multiple sclerosis (HCC) [G35] 11/22/2016  . Thoracic root lesion [G54.3] 11/22/2016  . MS (multiple sclerosis) (HCC) [G35]   . Neuropathic pain [M79.2]   . Uncontrolled type 2 diabetes mellitus with peripheral neuropathy (HCC) [E11.42, E11.65]   . Urinary retention [R33.9]   . Medically noncompliant [Z91.19]   . Leg weakness, bilateral [R29.898] 11/20/2016  . Diabetic ketoacidosis without coma associated with type 1 diabetes mellitus (HCC) [E10.10]   . Leg pain [M79.606]   . History of CVA (cerebrovascular accident) [Z86.73]    . 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 Benign essential HTN [I10]   . Hypertensive urgency [I16.0]   . AKI (acute kidney injury) (HCC) [N17.9]   . Fibromyalgia [M79.7] 11/07/2016  . Back pain [M54.9] 10/31/2016  . Stable angina pectoris (HCC) [I20.8]   . Vitamin D deficiency [E55.9] 08/31/2014  . 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 Hyperlipidemia [E78.5] 10/06/2013  . Depression [F32.9] 10/16/2012  . Bilateral knee pain [M25.561, M25.562] 02/16/2012  . Chronic diarrhea [K52.9] 10/19/2010  . Peripheral neuropathy [G62.9] 08/19/2010  . Type 1 diabetes mellitus (HCC) [E10.9] 07/06/2010  . Hypertension [I10] 07/06/2010  . Asthma [J45.909] 07/06/2010  . Migraine [G43.909] 07/06/2010   Total Time spent with patient: 15 minutes  Past Psychiatric History: Depression   Past Medical History:  Past Medical History:  Diagnosis Date  . Adenomatous colonic polyps   . Anemia    2005  . Anxiety    1990  . Arthritis   . Asthma    2000  . Cataract   . Depression 1990  . Difficult intubation    narrow airway  . Gastroesophageal Reflux Disease (GERD)   . Heart murmur    Birth  . Hyperlipidemia    2005  . Hypertension    1998  . Internal hemorrhoids 04/24/06   on colonoscopy  . Migraine   . Neuropathy of the hands & feet   . Restless Leg Syndrome   . Right Ankle Fracture 10/06/2013  . Sleep  paralysis   . Stable angina pectoris    2007: cath showing normal cors.   . Stroke 1990  . Type I Diabetes Mellitus 1988    Past Surgical History:  Procedure Laterality Date  . bilateral foot surgery    . BREAST SURGERY Left    biopsy left breast  . CARDIAC CATHETERIZATION  2007   around 2007 or 2008  . CESAREAN SECTION    . CHOLECYSTECTOMY    . COLONOSCOPY W/ BIOPSIES AND POLYPECTOMY    . DILATION AND CURETTAGE OF UTERUS  12/29/2010   Surgeon: Tereso Newcomer, MD;  Location: [facility]  ORS;  Service: Gynecology  . ENDOMETRIAL ABLATION W/ NOVASURE N/A 12/2009  . EYE SURGERY    . HYSTEROSCOPY  12/29/2010   Procedure: HYSTEROSCOPY WITH HYDROTHERMAL ABLATION;  Surgeon: Tereso Newcomer, MD;  Location: [facility] ORS;  Service: Gynecology;;  . IUD REMOVAL  12/29/2010   Procedure: INTRAUTERINE DEVICE (IUD) REMOVAL;  Surgeon: Tereso Newcomer, MD;  Location: [facility] ORS;  Service: Gynecology;  Laterality: N/A;  . LAPAROSCOPIC TUBAL LIGATION  12/29/2010   Procedure: LAPAROSCOPIC TUBAL LIGATION;  Surgeon: Tereso Newcomer, MD;  Location: [facility] ORS;  Service: Gynecology;  Laterality: Bilateral;  . ORIF ANKLE FRACTURE Right 10/09/2013   Procedure: Open reduction internal fixation right ankle;  Surgeon: Mable Paris, MD;  Location: [facility] OR;  Service: Orthopedics;  Laterality: Right;  Open reduction internal fixation right ankle  . TRIGGER FINGER RELEASE     x 3   Family History:  Family History  Problem Relation Age of Onset  . Hypertension Mother   . Kidney disease Mother   . Hypertension Father   . Breast cancer Maternal Grandmother   . Prostate cancer Maternal Grandfather   . Ovarian cancer Paternal Grandmother   . Prostate cancer Paternal Grandfather   . Colon cancer Maternal Uncle        Family history of malignant neoplasm of gastrointestinal tract   Family Psychiatric  History: Denies  Social History:  Social History   Substance and Sexual Activity  Alcohol Use No  . Alcohol/week: 0.0 oz     Social History   Substance and Sexual Activity  Drug Use No   Comment: drug addict    Social History   Socioeconomic History  . Marital status: Divorced    Spouse name: None  . Number of children: 2  . Years of education: 72  . Highest education level: None  Social Needs  . Financial resource strain: None  . Food insecurity - worry: None  . Food insecurity - inability: None  . Transportation needs - medical: None  . Transportation needs - non-medical: None  Occupational  History  . Occupation: Unemployed  Tobacco Use  . Smoking status: Never Smoker  . Smokeless tobacco: Never Used  Substance and Sexual Activity  . Alcohol use: No    Alcohol/week: 0.0 oz  . Drug use: No    Comment: drug addict  . Sexual activity: Yes    Birth control/protection: Other-see comments    Comment: pt states she had ablation in 2011  Other Topics Concern  . None  Social History Narrative   Lost medicaid about 2009 ish when her youngest child turned 6. Has adult children. Lives with boyfriend who financially supports her. Has attempted to get disability but has been turned down.      2-3 caffeine drinks daily     Sleep: Good  Appetite:  Good  Current Medications: Current  Facility-Administered Medications  Medication Dose Route Frequency Provider Last Rate Last Dose  . acetaminophen (TYLENOL) tablet 650 mg  650 mg Oral Q4H PRN Charm Rings, NP   650 mg at 01/16/17 2229  . albuterol (PROVENTIL) (2.5 MG/3ML) 0.083% nebulizer solution 2.5 mg  2.5 mg Inhalation Q4H PRN Charm Rings, NP      . alum & mag hydroxide-simeth (MAALOX/MYLANTA) 200-200-20 MG/5ML suspension 30 mL  30 mL Oral Q4H PRN Charm Rings, NP      . amLODipine (NORVASC) tablet 10 mg  10 mg Oral Daily [name], MD   10 mg at 01/30/17 1008  . aspirin EC tablet 81 mg  81 mg Oral Daily [name], MD   81 mg at 01/30/17 1009  . atorvastatin (LIPITOR) tablet 40 mg  40 mg Oral Daily [name], MD   40 mg at 01/30/17 1007  . baclofen (LIORESAL) tablet 5 mg  5 mg Oral TID [name], MD   5 mg at 01/30/17 1750  . bethanechol (URECHOLINE) tablet 50 mg  50 mg Oral TID Charm Rings, NP   50 mg at 01/30/17 1751  . DULoxetine (CYMBALTA) DR capsule 60 mg  60 mg Oral Daily [name], MD   60 mg at 01/30/17 1008  . feeding supplement (BOOST / RESOURCE BREEZE) liquid 1 Container  1 Container Oral BID BM [name], MD   1 Container at 01/30/17 1406  . heparin injection 5,000 Units   5,000 Units Subcutaneous Q8H Deneise Lever, MD   5,000 Units at 01/30/17 1406  . hydrALAZINE (APRESOLINE) tablet 100 mg  100 mg Oral Q8H [name], MD   100 mg at 01/30/17 1406  . insulin aspart (novoLOG) injection 0-15 Units  0-15 Units Subcutaneous TID WC Nyra Market, MD   11 Units at 01/30/17 1759  . insulin aspart (novoLOG) injection 0-5 Units  0-5 Units Subcutaneous QHS [name], MD   2 Units at 01/29/17 2339  . insulin aspart (novoLOG) injection 10 Units  10 Units Subcutaneous TID WC Angelita Ingles, MD   10 Units at 01/30/17 1759  . insulin glargine (LANTUS) injection 30 Units  30 Units Subcutaneous QHS [name], MD   30 Units at 01/29/17 2337  . magnesium hydroxide (MILK OF MAGNESIA) suspension 30 mL  30 mL Oral Daily PRN Charm Rings, NP   30 mL at 01/29/17 2343  . metoprolol succinate (TOPROL-XL) 24 hr tablet 100 mg  100 mg Oral Daily [name], MD   100 mg at 01/30/17 1009  . mirtazapine (REMERON) tablet 45 mg  45 mg Oral QHS [name], MD   45 mg at 01/29/17 2342  . ondansetron (ZOFRAN) tablet 4 mg  4 mg Oral Q8H PRN Charm Rings, NP   4 mg at 01/05/17 1211  . pantoprazole (PROTONIX) EC tablet 40 mg  40 mg Oral Daily [name], MD   40 mg at 01/30/17 1007  . polyethylene glycol (MIRALAX / GLYCOLAX) packet 17 g  17 g Oral Daily PRN [name], MD      . pregabalin (LYRICA) capsule 100 mg  100 mg Oral TID [name], MD   100 mg at 01/30/17 1750  . sodium chloride flush (NS) 0.9 % injection 10-40 mL  10-40 mL Intracatheter PRN [name], MD   10 mL at 01/10/17 0816  . sodium chloride flush (NS) 0.9 % injection 3 mL  3 mL Intravenous Q12H Deneise Lever, MD  3 mL at 01/28/17 2250  . traMADol (ULTRAM) tablet 50 mg  50 mg Oral Q6H PRN Ginger CarneHarden, Kyler, MD   50 mg at 01/29/17 1028  . zolpidem (AMBIEN) tablet 5 mg  5 mg Oral QHS PRN Charm RingsLord, Jamison Y, NP   5 mg at 01/21/17 2208    Lab Results:  Results for orders placed or performed  during the hospital encounter of 01/02/17 (from the past 48 hour(s))  Glucose, capillary     Status: Abnormal   Collection Time: 01/29/17  6:03 AM  Result Value Ref Range   Glucose-Capillary 334 (H) 65 - 99 mg/dL   Comment 1 Notify RN    Comment 2 Document in Chart   Glucose, capillary     Status: Abnormal   Collection Time: 01/29/17 11:40 AM  Result Value Ref Range   Glucose-Capillary 161 (H) 65 - 99 mg/dL  Glucose, capillary     Status: Abnormal   Collection Time: 01/29/17  4:43 PM  Result Value Ref Range   Glucose-Capillary 217 (H) 65 - 99 mg/dL  Glucose, capillary     Status: Abnormal   Collection Time: 01/29/17  9:04 PM  Result Value Ref Range   Glucose-Capillary 241 (H) 65 - 99 mg/dL   Comment 1 Notify RN    Comment 2 Document in Chart   Glucose, capillary     Status: Abnormal   Collection Time: 01/30/17  6:38 AM  Result Value Ref Range   Glucose-Capillary 263 (H) 65 - 99 mg/dL  Glucose, capillary     Status: Abnormal   Collection Time: 01/30/17  8:07 AM  Result Value Ref Range   Glucose-Capillary 193 (H) 65 - 99 mg/dL   Comment 1 Notify RN   Glucose, capillary     Status: Abnormal   Collection Time: 01/30/17 11:12 AM  Result Value Ref Range   Glucose-Capillary 124 (H) 65 - 99 mg/dL  Glucose, capillary     Status: Abnormal   Collection Time: 01/30/17  5:57 PM  Result Value Ref Range   Glucose-Capillary 350 (H) 65 - 99 mg/dL    Blood Alcohol level:  Lab Results  Component Value Date   ETH <10 12/20/2016   ETH  12/09/2006    <5        LOWEST DETECTABLE LIMIT FOR SERUM ALCOHOL IS 11 mg/dL FOR MEDICAL PURPOSES ONLY    Musculoskeletal: Strength & Muscle Tone: decreased Gait & Station: Unable to assess since patient was lying in bed. Patient leans: N/A  Psychiatric Specialty Exam: Physical Exam  Nursing note and vitals reviewed. Constitutional: She is oriented to person, place, and time. She appears well-developed and well-nourished.  HENT:  Head:  Normocephalic and atraumatic.  Neck: Normal range of motion.  Respiratory: Effort normal.  Musculoskeletal: Normal range of motion.  Neurological: She is alert and oriented to person, place, and time.  Skin: No rash noted.  Psychiatric: Her behavior is normal. Judgment and thought content normal. Cognition and memory are normal.    Review of Systems  Constitutional: Negative for chills and fever.  Eyes: Positive for blurred vision.  Gastrointestinal: Negative for abdominal pain, constipation, diarrhea, nausea and vomiting.    Blood pressure (!) 134/59, pulse 80, temperature 98.7 F (37.1 C), temperature source Oral, resp. rate 18, height 5\' 4"  (1.626 m), weight 68.2 kg (150 lb 5.7 oz), SpO2 99 %.Body mass index is 25.81 kg/m.  General Appearance: Well Groomed, African American female who is lying in bed in a hospital gown  with corrective lenses. NAD.   Eye Contact:  Good  Speech:  Clear and Coherent and Normal Rate  Volume:  Normal  Mood:  "Okay"  Affect:  Depressed  Thought Process:  Goal Directed and Linear  Orientation:  Full (Time, Place, and Person)  Thought Content:  Logical  Suicidal Thoughts:  Yes.  without intent/plan  Homicidal Thoughts:  No  Memory:  Immediate;   Good Recent;   Good Remote;   Good  Judgement:  Good  Insight:  Good  Psychomotor Activity:  Decreased. Minimal spontaneous movements.   Concentration:  Concentration: Good and Attention Span: Good  Recall:  Good  Fund of Knowledge:  Good  Language:  Good  Akathisia:  No  Handed:  Right  AIMS (if indicated):   N/A  Assets:  Communication Skills Desire for Improvement  ADL's:  Intact  Cognition:  WNL  Sleep:   Good    Assessment: Debra [name] is a 51 y.o. female who was a transfer from [facility] for treatment ofacute MS with plasma exchange treatment due to worsening vision that was not improved with Solu-Medrol.She has had improvement of MS symptoms butcontinues to endorse SI with depressive  symptoms that [facility].Shecontinues towarrant inpatient psychiatric hospitalization once she is medically cleared due to high risk of harm to self.   Treatment Plan Summary: -Continue bedside sitter due to ongoing SI and risk of harm to self.  -Continue Cymbalta 60 mg daily for depression. -Continue Remeron 45 mg qhsfor sleep, anxiety and depression.  -Patientcontinues torequire inpatient psychiatric hospitalization following medical clearance given continued SI. -It appears that patient has several psychosocial stressors that are contributing to her depressive symptoms and ongoing SI such as lack of social support and stable housing. SW is also following the patient. It may be beneficial for her to be provided with resources for housing and/or additional supports to help with her care.   Cherly Beach, DO 01/30/2017, 10:16 PM

## 2017-01-31 ENCOUNTER — Inpatient Hospital Stay (HOSPITAL_COMMUNITY): Payer: Medicaid Other

## 2017-01-31 DIAGNOSIS — E559 Vitamin D deficiency, unspecified: Secondary | ICD-10-CM

## 2017-01-31 LAB — GLUCOSE, CAPILLARY
GLUCOSE-CAPILLARY: 258 mg/dL — AB (ref 65–99)
GLUCOSE-CAPILLARY: 187 mg/dL — AB (ref 65–99)
GLUCOSE-CAPILLARY: 41 mg/dL — AB (ref 65–99)
GLUCOSE-CAPILLARY: 87 mg/dL (ref 65–99)
GLUCOSE-CAPILLARY: 278 mg/dL — AB (ref 65–99)
Glucose-Capillary: 396 mg/dL — ABNORMAL HIGH (ref 65–99)

## 2017-01-31 LAB — HEPATIC FUNCTION PANEL
ALBUMIN: 3.8 g/dL (ref 3.5–5.0)
ALT: 51 U/L (ref 14–54)
AST: 37 U/L (ref 15–41)
Alkaline Phosphatase: 93 U/L (ref 38–126)
BILIRUBIN TOTAL: 0.7 mg/dL (ref 0.3–1.2)
Bilirubin, Direct: 0.1 mg/dL (ref 0.1–0.5)
Indirect Bilirubin: 0.6 mg/dL (ref 0.3–0.9)
Total Protein: 6.7 g/dL (ref 6.5–8.1)

## 2017-01-31 MED ORDER — INSULIN ASPART 100 UNIT/ML ~~LOC~~ SOLN
0.0000 [IU] | SUBCUTANEOUS | Status: DC
  Administered 2017-01-31: 8 [IU] via SUBCUTANEOUS
  Administered 2017-01-31: 15 [IU] via SUBCUTANEOUS
  Administered 2017-02-01 (×2): 2 [IU] via SUBCUTANEOUS
  Administered 2017-02-01: 15 [IU] via SUBCUTANEOUS
  Administered 2017-02-01: 3 [IU] via SUBCUTANEOUS

## 2017-01-31 MED ORDER — GADOBENATE DIMEGLUMINE 529 MG/ML IV SOLN
15.0000 mL | Freq: Once | INTRAVENOUS | Status: AC
  Administered 2017-01-31: 15 mL via INTRAVENOUS

## 2017-01-31 MED ORDER — SODIUM CHLORIDE 0.9 % IV SOLN
500.0000 mg | Freq: Two times a day (BID) | INTRAVENOUS | Status: DC
  Administered 2017-02-01 – 2017-02-02 (×4): 500 mg via INTRAVENOUS
  Filled 2017-01-31 (×7): qty 4

## 2017-01-31 NOTE — Progress Notes (Signed)
PT Cancellation Note  Patient Details Name: Debra [name] MRN: [id number] DOB: 1966-02-03   Cancelled Treatment:    Reason Eval/Treat Not Completed: Patient at procedure or test/unavailable. Pt off floor at extensive MRI. Acute PT to return as able.   Evart Mcdonnell M Yanett Conkright 01/31/2017, 10:48 AM   Lewis Shock, PT, DPT Pager #: [phone] Office #: [phone]

## 2017-01-31 NOTE — Progress Notes (Signed)
Paged Internal Medicine about 3am cbg of 258; Dr does not want to intervene at this time....will continue to monitor

## 2017-01-31 NOTE — Progress Notes (Signed)
Called Internal Medicine about bp of 168/80, no orders given.  Will continue to monitor.

## 2017-01-31 NOTE — Progress Notes (Signed)
   Subjective:  Patient with more depressed mood today , wasn't willing to follow the physical exam, said her legs are still hurting.  Today she says she doesn't feel like her leg strength is improving which is contrary to the prior 3 days.    Objective:  Vital signs in last 24 hours: Vitals:   01/30/17 1807 01/30/17 2126 01/31/17 0527 01/31/17 1300  BP: (!) 160/80 (!) 134/59 (!) 168/80 (!) 116/57  Pulse: 80 80 71 73  Resp: 20 18 17 18   Temp: 97.6 F (36.4 C) 98.7 F (37.1 C) 98.4 F (36.9 C) 97.9 F (36.6 C)  TempSrc: Oral Oral Oral Oral  SpO2: 100% 99% 100% 100%  Weight:   149 lb 14.6 oz (68 kg)   Height:       Constitutional: NAD CV: RRR, 2/6 systolic murmur Resp: CTAB, no increased work of breathing Abd: soft, NDNT MSK, neuro: Sensation intact throughout, UE strength intact, did not cooperate with LE exam today  Assessment/Plan:  Principal Problem:   Major depressive disorder, single episode, severe without psychosis (HCC) Active Problems:   Vitamin D deficiency   Multiple sclerosis (HCC)  MDD with persistent suicidal ideation: Unchanged.  --more depressed mood today --Continue Cymbalta 60mg  daily, and Remeron 45mg  qhs --Pending placement  MS, potential NMO: This was presenting problem initially. S/p treatment but not on maintenance therapy which is hindering psych placement esp since patient was admitted from psych to [facility] initially due to MS flare.  --Pts MRI this morning shows new enhancing lesions.  Given aggressive course of pts MS neurology is considering starting Rituximab and recommends beginning Solumedrol 500mg  BID for the next few days. --they are checking for TB, hepatitis panel and LFT's  --Will check vitamin D today and likely initiate therapy as prior levels were low  T1DM: --Question of pt having low blood sugar today.  Unsure if she ate breakfast, was at bedside as pt got her lunch but was not notified of any low blood sugar.  Pt was asymptomatic,  unsure if true value, regardless will likely have to start over since starting high dose steroids again.  --Lantus 30 units qhs, novolog 10 units TID ac --SSI  Moderate switch to every 4 hours    Dispo: Anticipated discharge pending neuro recommendations and inpatient psychiatry placement.   Angelita Ingles, MD 01/31/2017, 3:06 PM

## 2017-01-31 NOTE — Progress Notes (Signed)
Reason for consult: MS  Subjective: Reports continued vision difficulties. Notes bilateral hip pain and leg weakness.    Examination  Vital signs in last 24 hours: Temp:  [97.6 F (36.4 C)-99.2 F (37.3 C)] 98.4 F (36.9 C) (12/05 0527) Pulse Rate:  [71-80] 71 (12/05 0527) Resp:  [17-20] 17 (12/05 0527) BP: (134-168)/(59-80) 168/80 (12/05 0527) SpO2:  [98 %-100 %] 100 % (12/05 0527) Weight:  [149 lb 14.6 oz (68 kg)] 149 lb 14.6 oz (68 kg) (12/05 0527)  General: Not in distress, cooperative CVS: pulse-normal rate and rhythm RS: breathing comfortably Extremities: normal   Neuro: MS: Alert, oriented, follows commands CN: pupils equal and reactive,  EOMI, face symmetric, tongue midline, normal sensation over face Motor: 5/5 strength in UE, 1/5 in bilateral LE Reflexes: 2+ bilaterally over patella, biceps, plantars: flexor Coordination: normal Gait: not tested  ASSESSMENT AND PLAN 51 year old female with MS who initially presented last month with MS flare s/p treatment with plasmapheresis. Now needing psychiatric placement for MDD with persistent suicidal ideations. Neurology consulted for initiation of disease modifying therapy inpatient prior to transfer to inpatient psychiatry.   Discussed with Dr. Epimenio Foot at [facility]. He recommends avoiding interferons as this can increase SI. Recommended starting either Tecfidera or Ocrevus. After discussion with patient, she would prefer Ocrevus as it is a once a month infusion. Prior to starting will need to screen for Hep B, Hep C, TB and will check LFTs. T  hese are both expensive medications and usually require prior authorization. Will co-ordinate with Dr. Bonnita Hollow office to try to get approval for the medications. Will need to contact Dr. Bonnita Hollow office before discharge from inpatient psych to arrange follow up.   Will check MRI C/T-spine to assess for any active lesions as patient does demonstrate more weakness today  compared to previously. If active lesions are present, will start prednisone.    Valentino Nose, MD Internal Medicine Teaching Program PGY-3  NEUROHOSPITALIST ADDENDUM Seen and examined the patient this AM.  Patient has regressed in rehab. Repeat MRI C and T spine shows no enhancing lesions. Please start another 5 day course of Solumedrol 500mg  BID. Working with Dr Bonnita Hollow office to get her on maintenance therapy and prior authorization.  Georgiana Spinner Aroor MD [facility] [phone]  If 7pm to 7am, please call on call as listed on AMION.

## 2017-01-31 NOTE — Progress Notes (Signed)
Patient returned from MRI. CBG is 41, patient is asymptomatic, hypoglycemic protocol followed, MD is at her bedside. CBG is re-assessed and result is 87.  Awaiting lunch tray. Patient is up in the chair with PT assistance. Sitter is at her bedside. Will continue to monitor.

## 2017-01-31 NOTE — Progress Notes (Signed)
Physical Therapy Treatment Patient Details Name: Debra [name] MRN: [id number] DOB: Sep 17, 1965 Today's Date: 01/31/2017    History of Present Illness Pt is a 51 y/o female admitted to [facility] secondary to MS exacerbation and major depressive disorder. Pt transferred to [facility] for insertion of R IJ catheter and plasmophoresis treatments. Pt also very depressed about recent decline in function and psychiatry following. Pt only completed 4/5 PLEX and was discontinued (possible infection acquired). PMH includes MS, HTN, DM with neuropathy, depression, heart murmur, and restless leg syndrome.     PT Comments    Despite pt with low blood sugar at 87 and dizziness with standing pt tolerate PT well today and complete 7 stands. Pt actually able to stand from elevated surface via pulling up on stedy with min guard assist. Pt con't to require modA to achieve full upright standing from bed height. Acute PT to con't to follow.  Follow Up Recommendations  CIR     Equipment Recommendations  None recommended by PT    Recommendations for Other Services Rehab consult     Precautions / Restrictions Precautions Precautions: Fall Precaution Comments: suicide precautions; watch BP Restrictions Weight Bearing Restrictions: No    Mobility  Bed Mobility Overal bed mobility: Needs Assistance Bed Mobility: Rolling;Sidelying to Sit Rolling: Supervision Sidelying to sit: HOB elevated;Supervision       General bed mobility comments: used bed rail and EOB elevated to 40 deg but no physical assist required  Transfers Overall transfer level: Needs assistance Equipment used: (stedy) Transfers: Sit to/from Stand Sit to Stand: Mod assist Stand pivot transfers: (used stedy)       General transfer comment: worked on sit to stand. Pt able to stand up into stedy from bed x 2 with modA x1. Pt able to complete 5 sit to stands from elevated surface on the stedy. pt able to tolerate 15-60 sec. focused on  approriate posture and isolating pelvis with posterior pelvic tilts  Ambulation/Gait                 Stairs            Wheelchair Mobility    Modified Rankin (Stroke Patients Only)       Balance Overall balance assessment: Needs assistance Sitting-balance support: Feet supported;Single extremity supported;Bilateral upper extremity supported;No upper extremity supported Sitting balance-Leahy Scale: Fair     Standing balance support: During functional activity;Bilateral upper extremity supported Standing balance-Leahy Scale: Zero Standing balance comment: Pt stands heavily reliant on UEs on Stedy with knees blocked min a +2                            Cognition Arousal/Alertness: Awake/alert Behavior During Therapy: Flat affect Overall Cognitive Status: Within Functional Limits for tasks assessed                                        Exercises      General Comments        Pertinent Vitals/Pain Pain Assessment: No/denies pain Pain Intervention(s): Monitored during session    Home Living                      Prior Function            PT Goals (current goals can now be found in the care plan section) Progress towards  PT goals: Progressing toward goals    Frequency    Min 4X/week      PT Plan Current plan remains appropriate    Co-evaluation              AM-PAC PT "6 Clicks" Daily Activity  Outcome Measure  Difficulty turning over in bed (including adjusting bedclothes, sheets and blankets)?: A Little Difficulty moving from lying on back to sitting on the side of the bed? : A Lot Difficulty sitting down on and standing up from a chair with arms (e.g., wheelchair, bedside commode, etc,.)?: Unable Help needed moving to and from a bed to chair (including a wheelchair)?: A Lot Help needed walking in hospital room?: Total Help needed climbing 3-5 steps with a railing? : Total 6 Click Score: 10     End of Session   Activity Tolerance: Patient tolerated treatment well Patient left: in chair;with call bell/phone within reach;with family/visitor present Nurse Communication: Mobility status PT Visit Diagnosis: Unsteadiness on feet (R26.81);Muscle weakness (generalized) (M62.81);Other symptoms and signs involving the nervous system (R29.898);Difficulty in walking, not elsewhere classified (R26.2)     Time: 7001-7494 PT Time Calculation (min) (ACUTE ONLY): 27 min  Charges:  $Therapeutic Activity: 23-37 mins                    G Codes:       Lewis Shock, PT, DPT Pager #: [phone] Office #: [phone]    Manolito Jurewicz M Aesha Agrawal 01/31/2017, 2:13 PM

## 2017-01-31 NOTE — Progress Notes (Signed)
MD states to try first with ultra-sound and if it does not work page for a midline. IV Team notified

## 2017-01-31 NOTE — Progress Notes (Signed)
IV team member called to request if MD can order a midline for patient.  MD paged to request 

## 2017-01-31 NOTE — Progress Notes (Signed)
Inpatient Diabetes Program Recommendations  AACE/ADA: New Consensus Statement on Inpatient Glycemic Control (2015)  Target Ranges:  Prepandial:   less than 140 mg/dL      Peak postprandial:   less than 180 mg/dL (1-2 hours)      Critically ill patients:  140 - 180 mg/dL   Lab Results  Component Value Date   GLUCAP 87 01/31/2017   HGBA1C 11.4 (H) 12/23/2016    Review of Glycemic Control  Results for AZIE, KARON (MRN [id number]) as of 01/31/2017 13:14  Ref. Range 01/30/2017 17:57 01/30/2017 22:26 01/31/2017 03:22 01/31/2017 07:47 01/31/2017 10:41 01/31/2017 11:27 01/31/2017 11:49 01/31/2017 11:53  Glucose-Capillary Latest Ref Range: 65 - 99 mg/dL 801 (H) 655 (H) 374 (H) 187 (H)  41 (LL)  87   Diabetes history: Type 1 DM Outpatient Diabetes medications:Novolog 0-15 units tid with meals, Novolog 8 units tid with meals, Lantus 15 units bid Current orders for Inpatient glycemic control: Novolog sensitive tid with meals and HS, Novolog7units tid with meals Lantus 30 units q HS    Inpatient Diabetes Program Recommendations:  Given patient had a hypoglycemic event of 41mg /dL following AM correction, consider decreasing Novolog to sensitive correction 0-9 Units TID. Did verifiy with the nurse that patient had >50% of breakfast.   Thanks, Lujean Rave, MSN, RNC-OB Diabetes Coordinator [phone] (8a-5p)

## 2017-01-31 NOTE — Progress Notes (Signed)
Nutrition Follow Up Note   DOCUMENTATION CODES:   Not applicable  INTERVENTION:   Continue Boost Breeze po BID, each supplement provides 250 kcal and 9 grams of protein  NUTRITION DIAGNOSIS:   Inadequate oral intake related to acute illness, poor appetite as evidenced by per patient/family report.  GOAL:   Patient will meet greater than or equal to 90% of their needs  -progressing   MONITOR:   PO intake, I & O's, Labs, Weight trends, Supplement acceptance  ASSESSMENT:   51 y.o. female who was a transfer from [facility] for treatment of acute MS with plasma exchange treatment due to worsening vision that was not improved with Solu-Medrol. She has had improvement of MS symptoms but continues to endorse SI with depressive symptoms that have not improved since treatment at [facility] inpatient psychiatric unit or current hospitalization. She continues to warrant inpatient psychiatric hospitalization once she is medically cleared due to high risk of harm to self.   Pt continues to do well; eating 50-100% of meals and drinking Boost Breeze. Per chart, pt with 11lb weight loss since 11/7. Weight loss likely r/t fluid changes as pt with improving edema. Per MD note, pt will need inpatient psych treatment when medically ready for discharge.   Medications reviewed and include: aspirin, bethanechol, heparin, insulin, mirtazapine, protonix  No new labs  Diet Order:  Diet Carb Modified Fluid consistency: Thin; Room service appropriate? Yes  EDUCATION NEEDS:   No education needs have been identified at this time  Skin:  Other: MSAD to buttocks  Last BM:  12/4  Height:   Ht Readings from Last 1 Encounters:  01/22/17 5\' 4"  (1.626 m)    Weight:   Wt Readings from Last 1 Encounters:  01/31/17 149 lb 14.6 oz (68 kg)    Ideal Body Weight:  54.54 kg  BMI:  Body mass index is 25.73 kg/m.  Estimated Nutritional Needs:   Kcal:  1500-1800kcal/day   Protein:  68-81g/day   Fluid:   >1.5L/day   [name] MS, RD, LDN Pager #[phone] After Hours Pager: [phone]

## 2017-01-31 NOTE — Progress Notes (Signed)
   01/31/17 1600  Clinical Encounter Type  Visited With Patient  Visit Type Follow-up  Consult/Referral To Chaplain  Spiritual Encounters  Spiritual Needs Prayer  Chaplain visited with PT during rounding as she had a specific request for prayer.

## 2017-02-01 LAB — GLUCOSE, CAPILLARY
GLUCOSE-CAPILLARY: 134 mg/dL — AB (ref 65–99)
GLUCOSE-CAPILLARY: 354 mg/dL — AB (ref 65–99)
Glucose-Capillary: 128 mg/dL — ABNORMAL HIGH (ref 65–99)
Glucose-Capillary: 161 mg/dL — ABNORMAL HIGH (ref 65–99)
Glucose-Capillary: 370 mg/dL — ABNORMAL HIGH (ref 65–99)
Glucose-Capillary: 431 mg/dL — ABNORMAL HIGH (ref 65–99)
Glucose-Capillary: 445 mg/dL — ABNORMAL HIGH (ref 65–99)
Glucose-Capillary: 516 mg/dL (ref 65–99)

## 2017-02-01 LAB — HCV COMMENT:

## 2017-02-01 LAB — HEPATITIS C ANTIBODY (REFLEX)

## 2017-02-01 LAB — HEPATITIS B SURFACE ANTIBODY,QUALITATIVE: Hep B S Ab: NONREACTIVE

## 2017-02-01 LAB — HEPATITIS B SURFACE ANTIGEN: Hepatitis B Surface Ag: NEGATIVE

## 2017-02-01 LAB — HEPATITIS B CORE ANTIBODY, TOTAL: Hep B Core Total Ab: NEGATIVE

## 2017-02-01 MED ORDER — VITAMIN D (ERGOCALCIFEROL) 1.25 MG (50000 UNIT) PO CAPS
50000.0000 [IU] | ORAL_CAPSULE | ORAL | Status: DC
  Administered 2017-02-01 – 2017-02-08 (×2): 50000 [IU] via ORAL
  Filled 2017-02-01 (×3): qty 1

## 2017-02-01 MED ORDER — INSULIN ASPART 100 UNIT/ML ~~LOC~~ SOLN
0.0000 [IU] | SUBCUTANEOUS | Status: DC
  Administered 2017-02-01 (×2): 20 [IU] via SUBCUTANEOUS
  Administered 2017-02-02: 4 [IU] via SUBCUTANEOUS
  Administered 2017-02-02: 20 [IU] via SUBCUTANEOUS
  Administered 2017-02-02: 15 [IU] via SUBCUTANEOUS
  Administered 2017-02-02: 11 [IU] via SUBCUTANEOUS
  Administered 2017-02-02 – 2017-02-03 (×2): 7 [IU] via SUBCUTANEOUS
  Administered 2017-02-03: 4 [IU] via SUBCUTANEOUS
  Administered 2017-02-03: 7 [IU] via SUBCUTANEOUS
  Administered 2017-02-03 (×2): 15 [IU] via SUBCUTANEOUS
  Administered 2017-02-03: 11 [IU] via SUBCUTANEOUS
  Administered 2017-02-04: 4 [IU] via SUBCUTANEOUS
  Administered 2017-02-04: 15 [IU] via SUBCUTANEOUS
  Administered 2017-02-04 (×2): 4 [IU] via SUBCUTANEOUS
  Administered 2017-02-04 (×2): 7 [IU] via SUBCUTANEOUS
  Administered 2017-02-05: 11 [IU] via SUBCUTANEOUS
  Administered 2017-02-05: 15 [IU] via SUBCUTANEOUS
  Administered 2017-02-05: 11 [IU] via SUBCUTANEOUS
  Administered 2017-02-05: 3 [IU] via SUBCUTANEOUS
  Administered 2017-02-05: 4 [IU] via SUBCUTANEOUS
  Administered 2017-02-05: 20 [IU] via SUBCUTANEOUS
  Administered 2017-02-06 (×4): 4 [IU] via SUBCUTANEOUS
  Administered 2017-02-06: 20 [IU] via SUBCUTANEOUS
  Administered 2017-02-06: 7 [IU] via SUBCUTANEOUS
  Administered 2017-02-07: 15 [IU] via SUBCUTANEOUS
  Administered 2017-02-07 (×2): 4 [IU] via SUBCUTANEOUS

## 2017-02-01 MED ORDER — INSULIN ASPART 100 UNIT/ML ~~LOC~~ SOLN
5.0000 [IU] | Freq: Once | SUBCUTANEOUS | Status: AC
  Administered 2017-02-01: 5 [IU] via SUBCUTANEOUS

## 2017-02-01 NOTE — Care Management Note (Signed)
Case Management Note  Patient Details  Name: Debra [name] MRN: [id number] Date of Birth: 1965/03/25  Subjective/Objective:                    Action/Plan: Pt restarted on IV steroids. Plan if for [facility] when medically ready. CM following.  Expected Discharge Date:                  Expected Discharge Plan:  IP Rehab Facility  In-House Referral:  Clinical Social Work  Discharge planning Services  CM Consult  Post Acute Care Choice:    Choice offered to:     DME Arranged:    DME Agency:     HH Arranged:    HH Agency:     Status of Service:  In process, will continue to follow  If discussed at Long Length of Stay Meetings, dates discussed:    Additional Comments:  Kermit Balo, RN 02/01/2017, 12:01 PM

## 2017-02-01 NOTE — Progress Notes (Signed)
CSW following to facilitate discharge planning. CSW received call from Chrissie Noa in admissions at [facility]. Chrissie Noa indicated that patient is on the wait list, in about the middle of the waitlist based on the date of the initial referral. At discharge, the patient will need to have a solid treatment plan with maintenance therapies established and placed on her discharge summary.   CSW will continue to follow.  Blenda Nicely, [place] Clinical Social Worker [phone]

## 2017-02-01 NOTE — Progress Notes (Signed)
Inserted 16 Fr foley cath per MD order. Notified Md Anthonette Legato the patient's urine is cloudy. Also informed MD that the patient's blood sugar was 516 and was covered 20 units per Novolog sliding scale. Will continue to monitor for patient safety.

## 2017-02-01 NOTE — Progress Notes (Signed)
   Subjective:  Patient with better mood today , cooperating with physical exam this am, said her legs are still hurting.    Objective:  Vital signs in last 24 hours: Vitals:   01/31/17 2054 01/31/17 2300 02/01/17 0042 02/01/17 0432  BP: (!) 107/45 124/62 120/64 (!) 141/71  Pulse: 74  73 73  Resp: 19  19 18   Temp: 98.9 F (37.2 C)  99.1 F (37.3 C) 98.2 F (36.8 C)  TempSrc: Oral  Oral Oral  SpO2: 100%  99% 100%  Weight:      Height:       Constitutional: NAD CV: RRR, 2/6 systolic murmur Resp: CTAB, no increased work of breathing Abd: soft, NDNT MSK, neuro: Sensation intact throughout, UE strength intact, RLE 4/5 strength, LLE 2/5  Assessment/Plan:  Principal Problem:   Major depressive disorder, single episode, severe without psychosis (HCC) Active Problems:   Vitamin D deficiency   Multiple sclerosis (HCC)  MDD with persistent suicidal ideation: Unchanged.  --more depressed mood today --Continue Cymbalta 60mg  daily, and Remeron 45mg  qhs --Pending placement  MS, potential NMO: This was presenting problem initially. S/p treatment but not on maintenance therapy which is hindering psych placement esp since patient was admitted from psych to [facility] initially due to MS flare.  --Pts MRI 12/5  shows new enhancing lesions.  Given aggressive course of pts MS neurology is considering starting Rituximab and recommends beginning Solumedrol 500mg  BID for the next few days. --they are checking for TB, hepatitis panel and LFT's  --Will check vitamin D today and likely initiate therapy as prior levels were low --Had difficulty getting IV yesterday so did not get first dose of solumedrol, has IV this am will get first dose  T1DM: --Patient had low blood sugar yesterday after MRI procedure, received Insulin but did not eat breakfast.  Pt was asymptomatic --Lantus 30 units qhs --SSI  resistant every 4 hours as pt now on high dose steroids will continue to monitor and adjust as  needed    Dispo: Anticipated discharge pending neuro recommendations and inpatient psychiatry placement.   Angelita Ingles, MD 02/01/2017, 9:21 AM

## 2017-02-01 NOTE — Progress Notes (Signed)
Occupational Therapy Treatment Patient Details Name: Debra [name] MRN: [id number] DOB: [birth year] Today's Date: 02/01/2017    History of present illness Pt is a 51 y/o female admitted  from [facility] secondary to MS exacerbation not responding to tx as well as SI and MDD.  Pt transferred to [facility] for insertion of R IJ catheter and plasmophoresis treatments. Pt also very depressed about recent decline in function and psychiatry following. Pt only completed 4/5 PLEX and was discontinued (possible infection acquired). PMH includes MS, HTN, DM with neuropathy, depression, heart murmur, and restless leg; MRI 02/01/17 = Regression of demyelinating lesions since September at the C2,C6, and T10 spinal cord levels.   OT comments  Pt with incontinent BM x2 requiring total assist for peri care but able to roll with supervision. Pt required min assist overall for bed mobility due to weakness and impaired balance. Pt tolerated theraband (level 1) exercises at EOB. D/c plan remains appropriate. Will continue to follow acutely.   Follow Up Recommendations  SNF;Supervision/Assistance - 24 hour    Equipment Recommendations  None recommended by OT    Recommendations for Other Services      Precautions / Restrictions Precautions Precautions: Fall Precaution Comments: suicide precautions; watch BP Restrictions Weight Bearing Restrictions: No       Mobility Bed Mobility Overal bed mobility: Needs Assistance Bed Mobility: Rolling;Supine to Sit;Sit to Supine Rolling: Supervision   Supine to sit: Min assist Sit to supine: Min assist   General bed mobility comments: Assist for trunk elevation to sitting and LEs back to bed. Pt able to scoot self up in bed with HOB flat and use of bed rails  Transfers                      Balance Overall balance assessment: Needs assistance Sitting-balance support: Feet supported;No upper extremity supported Sitting balance-Leahy Scale: Fair                                     ADL either performed or assessed with clinical judgement   ADL Overall ADL's : Needs assistance/impaired                             Toileting- Clothing Manipulation and Hygiene: Total assistance;Bed level Toileting - Clothing Manipulation Details (indicate cue type and reason): for peri care following incontient BM x2             Vision       Perception     Praxis      Cognition Arousal/Alertness: Awake/alert Behavior During Therapy: WFL for tasks assessed/performed Overall Cognitive Status: Within Functional Limits for tasks assessed                                 General Comments: reports intermittent short term memory impairments recently        Exercises Exercises: General Upper Extremity General Exercises - Upper Extremity Shoulder Flexion: AROM;Strengthening;Both;10 reps;Seated;Theraband Theraband Level (Shoulder Flexion): Level 1 (Yellow) Shoulder Horizontal ABduction: AROM;Strengthening;Both;10 reps;Seated;Theraband Theraband Level (Shoulder Horizontal Abduction): Level 1 (Yellow) Elbow Flexion: AROM;Strengthening;Both;10 reps;Seated;Theraband Theraband Level (Elbow Flexion): Level 1 (Yellow) Elbow Extension: AROM;Strengthening;Both;10 reps;Seated;Theraband Theraband Level (Elbow Extension): Level 1 (Yellow) Chair Push Up: 5 reps;Seated   Shoulder Instructions       General Comments  Pertinent Vitals/ Pain       Pain Assessment: Faces Faces Pain Scale: Hurts little more Pain Location: bil LEs Pain Descriptors / Indicators: Burning;Sharp Pain Intervention(s): Monitored during session;Limited activity within patient's tolerance  Home Living                                          Prior Functioning/Environment              Frequency  Min 2X/week        Progress Toward Goals  OT Goals(current goals can now be found in the care plan section)  Progress  towards OT goals: Progressing toward goals  Acute Rehab OT Goals Patient Stated Goal: Get stronger  OT Goal Formulation: With patient  Plan Discharge plan remains appropriate    Co-evaluation                 AM-PAC PT "6 Clicks" Daily Activity     Outcome Measure   Help from another person eating meals?: A Little Help from another person taking care of personal grooming?: A Little Help from another person toileting, which includes using toliet, bedpan, or urinal?: Total Help from another person bathing (including washing, rinsing, drying)?: A Lot Help from another person to put on and taking off regular upper body clothing?: A Lot Help from another person to put on and taking off regular lower body clothing?: Total 6 Click Score: 12    End of Session    OT Visit Diagnosis: Other abnormalities of gait and mobility (R26.89);Muscle weakness (generalized) (M62.81);Low vision, both eyes (H54.2)   Activity Tolerance Patient tolerated treatment well   Patient Left in bed;with call bell/phone within reach;with nursing/sitter in room   Nurse Communication          Time: 1520-1550 OT Time Calculation (min): 30 min  Charges: OT General Charges $OT Visit: 1 Visit OT Treatments $Self Care/Home Management : 8-22 mins $Therapeutic Exercise: 8-22 mins  An Schnabel A. Brett Albino, M.S., OTR/L Pager: [phone]   Gaye Alken 02/01/2017, 4:21 PM

## 2017-02-01 NOTE — Progress Notes (Signed)
Reason for consult: MS flare  Subjective: Patient reports no change from yesterday. Reports her weakness is stable. Mood remains the same.   ROS: negative except above  Examination  Vital signs in last 24 hours: Temp:  [97.7 F (36.5 C)-99.1 F (37.3 C)] 98.4 F (36.9 C) (12/06 1323) Pulse Rate:  [73-85] 85 (12/06 1323) Resp:  [18-19] 18 (12/06 1323) BP: (107-150)/(45-71) 150/66 (12/06 1323) SpO2:  [96 %-100 %] 97 % (12/06 1323)  General: Not in distress, cooperative CVS: pulse-normal rate and rhythm RS: breathing comfortably Extremities: normal   Neuro: MS: Alert, oriented, follows commands CN: pupils equal and reactive,  EOMI, face symmetric, tongue midline, normal sensation over face, Motor: 5/5 strength in bilateral UEs, 1/5 in LLE, 3/5 in RLE Reflexes: 2+ bilaterally over patella, biceps, plantars: flexor Coordination: normal Gait: not tested   ASSESSMENT AND PLAN  New enhancing lesions on repeat MRI C and T spine. Started back on 5 day course of Solumedrol 500 mg bid.   Normal LFTs on lab. HCV negative. Hep B Abs without evidence of previous infection or immunization. Quantiferon gold pending.  Given the new spinal lesions, Tecfidera would not be the ideal agent. Will work on getting Rituximab started while inpatient.    Valentino Nose, MD Internal Medicine Teaching Program PGY-3

## 2017-02-01 NOTE — Progress Notes (Signed)
Physical Therapy Treatment Patient Details Name: Debra [name] MRN: [id number] DOB: 1965-04-08 Today's Date: 02/01/2017    History of Present Illness Pt is a 51 y/o female admitted  from [facility] secondary to MS exacerbation not responding to tx as well as SI and MDD.  Pt transferred to [facility] for insertion of R IJ catheter and plasmophoresis treatments. Pt also very depressed about recent decline in function and psychiatry following. Pt only completed 4/5 PLEX and was discontinued (possible infection acquired). PMH includes MS, HTN, DM with neuropathy, depression, heart murmur, and restless leg; MRI 02/01/17 = Regression of demyelinating lesions since September at the C2,C6, and T10 spinal cord levels.    PT Comments    Pt seen for bed mobility/transfers/ex this am; pt appears to be quite variable in her presentation--able to stand multiple times with less assist during previous PT session, and today she is unable to wt bear at all through her LEs until the stedy is provided at which time she is able to stand with ~ min assist;   Recommendation previously  made for CIR however pt is without home support and may need longer term rehab; therefore, recommend SNF vs inpt pysch/BHC setting post acute (however pt would need to be mobile or at least able to transfer independently to go to [facility] or other similar venue); will continue to follow and update plan as needed    Follow Up Recommendations  SNF;Other (comment);Supervision/Assistance - 24 hour(vs inpt psych/BHC)     Equipment Recommendations       Recommendations for Other Services       Precautions / Restrictions Precautions Precautions: Fall Precaution Comments: suicide precautions; watch BP Restrictions Weight Bearing Restrictions: No    Mobility  Bed Mobility Overal bed mobility: Needs Assistance Bed Mobility: Rolling;Sidelying to Sit Rolling: Supervision Sidelying to sit: Min guard       General bed mobility comments: HOB  elevated 30*, min guard for safety when coming to sit d/t excessive anterior translation of trunk; pt is able to bring LEs up and off bed, she self assists LLE with UE  Transfers Overall transfer level: Needs assistance Equipment used: 2 person hand held assist;Ambulation equipment used Transfers: Sit to/from Stand Sit to Stand: +2 physical assistance;+2 safety/equipment;Mod assist Stand pivot transfers: (stedy used for safety)       General transfer comment: 3 standing trials attempted; pt does not wt bear on LEs with bil HHA, PT and tech supporting full wt, however when stedy brought to pt she was able to lift feet to place on platform, and actively extend hips/knees to come to stand and fully wt bear with min/guard assist until cued to sit (on stedy pads); pt was able to control descent when cued to do so  Ambulation/Gait             General Gait Details: pt unable   Stairs            Wheelchair Mobility    Modified Rankin (Stroke Patients Only)       Balance Overall balance assessment: Needs assistance Sitting-balance support: Single extremity supported;Feet supported Sitting balance-Leahy Scale: Fair Sitting balance - Comments: fair to poor, pt is able to maintain satic sit but demo's intermittent excessive anterior translation of trunk, requiring assist to return to midline   Standing balance support: Bilateral upper extremity supported Standing balance-Leahy Scale: Zero Standing balance comment: pt unable to wt bear today except with stedy, not excessively reliant on UEs when stedy used--able to  extend knees and fully wt bear through LEs                            Cognition Arousal/Alertness: Awake/alert Behavior During Therapy: WFL for tasks assessed/performed Overall Cognitive Status: Within Functional Limits for tasks assessed                                 General Comments: pt is interactive and smiling appropriately today; pt  states she is not sure what the plan is for her, discussed possibilities briefly with pt, defer further input to SW      Exercises General Exercises - Lower Extremity Ankle Circles/Pumps: AROM;Both;10 reps Quad Sets: AROM;Both;5 reps;Seated Gluteal Sets: AROM;Both;5 reps;Seated    General Comments        Pertinent Vitals/Pain Pain Assessment: Faces Faces Pain Scale: Hurts a little bit Pain Location: bil LEs Pain Descriptors / Indicators: Tightness Pain Intervention(s): Monitored during session    Home Living                      Prior Function            PT Goals (current goals can now be found in the care plan section) Acute Rehab PT Goals Patient Stated Goal: Get stronger  PT Goal Formulation: With patient Time For Goal Achievement: 02/06/17 Potential to Achieve Goals: Fair Progress towards PT goals: Progressing toward goals    Frequency    Min 3X/week      PT Plan Discharge plan needs to be updated    Co-evaluation              AM-PAC PT "6 Clicks" Daily Activity  Outcome Measure  Difficulty turning over in bed (including adjusting bedclothes, sheets and blankets)?: A Little Difficulty moving from lying on back to sitting on the side of the bed? : A Lot Difficulty sitting down on and standing up from a chair with arms (e.g., wheelchair, bedside commode, etc,.)?: Unable Help needed moving to and from a bed to chair (including a wheelchair)?: Total Help needed walking in hospital room?: Total Help needed climbing 3-5 steps with a railing? : Total 6 Click Score: 9    End of Session Equipment Utilized During Treatment: Gait belt;Other (comment)(stedy) Activity Tolerance: Patient tolerated treatment well Patient left: in chair;with call bell/phone within reach;with chair alarm set;with nursing/sitter in room Nurse Communication: Mobility status PT Visit Diagnosis: Unsteadiness on feet (R26.81);Muscle weakness (generalized) (M62.81);Other  symptoms and signs involving the nervous system (R29.898);Difficulty in walking, not elsewhere classified (R26.2)     Time: 5374-8270 PT Time Calculation (min) (ACUTE ONLY): 28 min  Charges:  $Therapeutic Activity: 23-37 mins                    G Codes:         Debra [name] 2017-02-05, 10:32 AM

## 2017-02-01 NOTE — Progress Notes (Signed)
Received notification from NT that foley came out when she was cleaning patient from bowel movement.  MD notified-Pure Wick(external urinal catheter) is placed for trial. Will re-order foley if patient does not urinate.

## 2017-02-02 DIAGNOSIS — E109 Type 1 diabetes mellitus without complications: Secondary | ICD-10-CM

## 2017-02-02 LAB — QUANTIFERON-TB GOLD PLUS: QuantiFERON-TB Gold Plus: NEGATIVE

## 2017-02-02 LAB — GLUCOSE, CAPILLARY
GLUCOSE-CAPILLARY: 187 mg/dL — AB (ref 65–99)
GLUCOSE-CAPILLARY: 314 mg/dL — AB (ref 65–99)
Glucose-Capillary: 237 mg/dL — ABNORMAL HIGH (ref 65–99)
Glucose-Capillary: 299 mg/dL — ABNORMAL HIGH (ref 65–99)
Glucose-Capillary: 412 mg/dL — ABNORMAL HIGH (ref 65–99)

## 2017-02-02 LAB — QUANTIFERON-TB GOLD PLUS (RQFGPL)
QUANTIFERON NIL VALUE: 0.03 [IU]/mL
QUANTIFERON TB1 AG VALUE: 0.04 [IU]/mL
QUANTIFERON TB2 AG VALUE: 0.03 [IU]/mL

## 2017-02-02 LAB — VITAMIN D 25 HYDROXY (VIT D DEFICIENCY, FRACTURES): Vit D, 25-Hydroxy: 9.5 ng/mL — ABNORMAL LOW (ref 30.0–100.0)

## 2017-02-02 MED ORDER — INSULIN ASPART 100 UNIT/ML ~~LOC~~ SOLN
7.0000 [IU] | Freq: Once | SUBCUTANEOUS | Status: AC
  Administered 2017-02-02: 7 [IU] via SUBCUTANEOUS

## 2017-02-02 MED ORDER — ASPIRIN 325 MG PO TABS
ORAL_TABLET | ORAL | Status: AC
  Administered 2017-02-02: 325 mg
  Filled 2017-02-02: qty 1

## 2017-02-02 MED ORDER — INSULIN ASPART 100 UNIT/ML ~~LOC~~ SOLN
12.0000 [IU] | Freq: Three times a day (TID) | SUBCUTANEOUS | Status: DC
  Administered 2017-02-02: 12 [IU] via SUBCUTANEOUS

## 2017-02-02 MED ORDER — INSULIN ASPART 100 UNIT/ML ~~LOC~~ SOLN
10.0000 [IU] | Freq: Three times a day (TID) | SUBCUTANEOUS | Status: DC
  Administered 2017-02-02 (×2): 10 [IU] via SUBCUTANEOUS

## 2017-02-02 NOTE — Progress Notes (Signed)
Plan is for [facility] once medically ready. She is on their wait list. Pt will need MS therapy initiated and plan for follow up with neurology noted prior to patient going to [facility]. CM following.

## 2017-02-02 NOTE — Progress Notes (Signed)
Reason for consult: MS flare  Subjective: Patient reports no changes from previous. Mood appears much better today compared to previous. She notes leg strength is unchanged from previous. Denies any other symptoms today.   ROS: negative except above  Examination  Vital signs in last 24 hours: Temp:  [97.6 F (36.4 C)-98.4 F (36.9 C)] 98.2 F (36.8 C) (12/07 1043) Pulse Rate:  [73-98] 83 (12/07 1043) Resp:  [18] 18 (12/07 1043) BP: (148-195)/(66-89) 195/89 (12/07 1043) SpO2:  [96 %-99 %] 98 % (12/07 1043) Weight:  [154 lb 15.7 oz (70.3 kg)] 154 lb 15.7 oz (70.3 kg) (12/07 0100)  General: Not in distress, cooperative CVS: pulse-normal rate and rhythm RS: breathing comfortably Extremities: normal   Neuro: MS: Alert, oriented, follows commands CN: pupils equal and reactive,  EOMI, face symmetric, tongue midline, normal sensation over face, Motor: 5/5 strength in bilateral UEs, 2/5 in LLE, 3+/5 in RLE Reflexes: 2+ bilaterally over patella, biceps, plantars: flexor Coordination: normal Gait: not tested   ASSESSMENT AND PLAN  New enhancing lesions on repeat MRI C and T spine done yesterday. Started back on 5 day course of Solumedrol 500 mg bid yesterday.   Normal LFTs on lab. HCV negative. Hep B Abs without evidence of previous infection or immunization. Quantiferon gold pending. [facility] reports results will not be back until 12/10. Will work on getting Rituximab started while inpatient but will have to wait until Quant gold results come back on Monday.    Valentino Nose, MD Internal Medicine Teaching Program PGY-3  NEUROHOSPITALIST ADDENDUM Seen and examined the patient this AM. Formulated plan as documented above. Recommendations as above.  Patient has received second dose of IV solumedrol, no significant improvement in exam. Hep panel neg, waiting for TB test to start patient on Rituximab.Spoke to pharmacy who will help arrange for infusion.   Georgiana Spinner Aroor MD [facility] [phone]  If 7pm to 7am, please call on call as listed on AMION.

## 2017-02-02 NOTE — Progress Notes (Signed)
CSW called [facility] [phone]) and spoke to EJ in admissions; they indicated patient remains on the waiting list. CSW to continue to follow and support with disposition.  Abigail Butts, LCSWA [phone]

## 2017-02-02 NOTE — Progress Notes (Signed)
   Subjective:  Patient with stable mood today , Still some leg pain, vision is stable, understands her clinical situation.  Objective:  Vital signs in last 24 hours: Vitals:   02/01/17 2113 02/02/17 0008 02/02/17 0100 02/02/17 0407  BP: (!) 148/66 (!) 174/78  (!) 162/82  Pulse: 94 80  73  Resp: 18 18  18   Temp: 98.4 F (36.9 C) 98 F (36.7 C)  98.2 F (36.8 C)  TempSrc: Oral Oral  Oral  SpO2: 96% 98%  99%  Weight:   154 lb 15.7 oz (70.3 kg)   Height:       Constitutional: NAD CV: RRR, 2/6 systolic murmur Resp: CTAB, no increased work of breathing Abd: soft, NDNT MSK, neuro: Sensation intact throughout, UE strength intact, RLE 4/5 strength, LLE 3/5  Assessment/Plan:  Principal Problem:   Major depressive disorder, single episode, severe without psychosis (HCC) Active Problems:   Vitamin D deficiency   Multiple sclerosis (HCC)  MDD with persistent suicidal ideation: Unchanged.  --more stable mood today --Continue Cymbalta 60mg  daily, and Remeron 45mg  qhs --Pending placement  MS, potential NMO: This was presenting problem initially. S/p treatment but not on maintenance therapy which is hindering psych placement esp since patient was admitted from psych to [facility] initially due to MS flare.  --Pts MRI 12/5  shows new enhancing lesions.  Given aggressive course of pts MS neurology is considering starting Rituximab and recommends beginning Solumedrol 500mg  BID for the next few days. --they are checking for TB, hepatitis panel and LFT's, all back negative Quantiferon still pending will likely return Monday  --Vitamin D low, replacing --Will continue Solumedrol, anticipate switch to rituximab after quantiferon result returns.  T1DM: --Lantus 30 units qhs, --Increased Novolog to 12 units TID AC, SSI  resistant every 4 hours as pt now on high dose steroids will continue to monitor and adjust as needed    Dispo: Anticipated discharge pending neuro recommendations and inpatient  psychiatry placement.   Angelita Ingles, MD 02/02/2017, 8:26 AM

## 2017-02-03 LAB — GLUCOSE, CAPILLARY
GLUCOSE-CAPILLARY: 224 mg/dL — AB (ref 65–99)
GLUCOSE-CAPILLARY: 322 mg/dL — AB (ref 65–99)
GLUCOSE-CAPILLARY: 330 mg/dL — AB (ref 65–99)
Glucose-Capillary: 257 mg/dL — ABNORMAL HIGH (ref 65–99)
Glucose-Capillary: 246 mg/dL — ABNORMAL HIGH (ref 65–99)
Glucose-Capillary: 182 mg/dL — ABNORMAL HIGH (ref 65–99)

## 2017-02-03 MED ORDER — SODIUM CHLORIDE 0.9 % IV SOLN
500.0000 mg | Freq: Two times a day (BID) | INTRAVENOUS | Status: AC
  Administered 2017-02-03 – 2017-02-05 (×6): 500 mg via INTRAVENOUS
  Filled 2017-02-03 (×6): qty 4

## 2017-02-03 MED ORDER — INSULIN GLARGINE 100 UNIT/ML ~~LOC~~ SOLN
35.0000 [IU] | Freq: Every day | SUBCUTANEOUS | Status: DC
  Administered 2017-02-03 – 2017-02-04 (×2): 35 [IU] via SUBCUTANEOUS
  Filled 2017-02-03 (×2): qty 0.35

## 2017-02-03 MED ORDER — INSULIN ASPART 100 UNIT/ML ~~LOC~~ SOLN
14.0000 [IU] | Freq: Three times a day (TID) | SUBCUTANEOUS | Status: DC
  Administered 2017-02-03 (×2): 14 [IU] via SUBCUTANEOUS

## 2017-02-03 MED ORDER — INSULIN ASPART 100 UNIT/ML ~~LOC~~ SOLN
18.0000 [IU] | Freq: Three times a day (TID) | SUBCUTANEOUS | Status: DC
  Administered 2017-02-03 – 2017-02-04 (×4): 18 [IU] via SUBCUTANEOUS

## 2017-02-03 NOTE — Progress Notes (Signed)
   Subjective:  Patient with stable mood today , Still some leg pain, vision is stable, hasn't noticed an improvement with the steroids so far. Denies any chest pain or SOB.    Objective:  Vital signs in last 24 hours: Vitals:   02/03/17 0558 02/03/17 0928 02/03/17 1200 02/03/17 1347  BP: (!) 176/82 (!) 169/76 (!) 167/71 (!) 166/76  Pulse: 70 85 79 73  Resp: 19 18  18   Temp: 98.2 F (36.8 C) 98.5 F (36.9 C)  98.5 F (36.9 C)  TempSrc: Oral Oral  Oral  SpO2: 100% 100%  100%  Weight:      Height:       Constitutional: NAD CV: RRR, 2/6 systolic murmur Resp: CTAB, no increased work of breathing Abd: soft, NDNT MSK, neuro: Sensation intact throughout, UE strength intact, RLE 4/5 strength, LLE 3/5  Assessment/Plan:  Principal Problem:   Major depressive disorder, single episode, severe without psychosis (HCC) Active Problems:   Vitamin D deficiency   Multiple sclerosis (HCC)  MDD with persistent suicidal ideation: Unchanged.  --stable mood today --Continue Cymbalta 60mg  daily, and Remeron 45mg  qhs --Pending placement  MS, potential NMO: This was presenting problem initially. S/p treatment but not on maintenance therapy which is hindering psych placement esp since patient was admitted from psych to [facility] initially due to MS flare.  --Pts MRI 12/5  shows new enhancing lesions.  Given aggressive course of pts MS neurology is considering starting Rituximab and recommends beginning Solumedrol 500mg  BID for the next few days. --they are checking for TB, hepatitis panel and LFT's, all back negative Quantiferon still pending will likely return Monday  --Vitamin D low, replacing --Will continue Solumedrol, anticipate switch to rituximab after quantiferon result returns.  T1DM: -- Increased Lantus 35 units qhs, --Increased Novolog to 15 units TID AC, SSI  resistant every 4 hours as pt now on high dose steroids will continue to monitor and adjust as needed    Dispo: Anticipated  discharge pending neuro recommendations and inpatient psychiatry placement.   Angelita Ingles, MD 02/03/2017, 3:57 PM

## 2017-02-04 LAB — GLUCOSE, CAPILLARY
GLUCOSE-CAPILLARY: 161 mg/dL — AB (ref 65–99)
GLUCOSE-CAPILLARY: 177 mg/dL — AB (ref 65–99)
GLUCOSE-CAPILLARY: 222 mg/dL — AB (ref 65–99)
Glucose-Capillary: 188 mg/dL — ABNORMAL HIGH (ref 65–99)
Glucose-Capillary: 205 mg/dL — ABNORMAL HIGH (ref 65–99)
Glucose-Capillary: 318 mg/dL — ABNORMAL HIGH (ref 65–99)

## 2017-02-04 MED ORDER — INSULIN ASPART 100 UNIT/ML ~~LOC~~ SOLN
20.0000 [IU] | Freq: Three times a day (TID) | SUBCUTANEOUS | Status: DC
  Administered 2017-02-05 (×2): 20 [IU] via SUBCUTANEOUS

## 2017-02-04 NOTE — Progress Notes (Signed)
   Subjective:  Patient with stable mood today , Still some leg pain, vision is stable, hasn't noticed an improvement with the steroids so far, feels a little weaker in her legs today. Denies any chest pain or SOB.    Objective:  Vital signs in last 24 hours: Vitals:   02/04/17 0502 02/04/17 0549 02/04/17 0649 02/04/17 0653  BP: (!) 208/93 (!) 206/94 (!) 192/92 (!) 194/88  Pulse: 73  71 71  Resp: 18  18 18   Temp: 98.4 F (36.9 C)   98 F (36.7 C)  TempSrc: Oral   Oral  SpO2: 99%  100% 100%  Weight:      Height:       Constitutional: NAD CV: RRR, 2/6 systolic murmur Resp: CTAB, no increased work of breathing Abd: soft, NDNT MSK, neuro: Sensation intact throughout, UE strength intact, RLE 3/5 strength, LLE 2/5  Assessment/Plan:  Principal Problem:   Major depressive disorder, single episode, severe without psychosis (HCC) Active Problems:   Vitamin D deficiency   Multiple sclerosis (HCC)  MDD with persistent suicidal ideation: Unchanged.  --stable mood today --Continue Cymbalta 60mg  daily, and Remeron 45mg  qhs --Pending placement  MS, potential NMO: This was presenting problem initially. S/p treatment but not on maintenance therapy which is hindering psych placement esp since patient was admitted from psych to [facility] initially due to MS flare.  --Pts MRI 12/5  shows new enhancing lesions.  Given aggressive course of pts MS neurology is considering starting Rituximab and recommends beginning Solumedrol 500mg  BID for the next few days. --they are checking for TB, hepatitis panel and LFT's, all back negative Quantiferon still pending will likely return Monday  --Vitamin D low, replacing --Will continue Solumedrol, anticipate switch to rituximab after quantiferon result returns.  T1DM: -- Lantus 35 units qhs, -- Novolog to 15 units TID AC, SSI  resistant every 4 hours as pt now on high dose steroids will continue to monitor and adjust as needed     Dispo: Anticipated  discharge pending neuro recommendations and inpatient psychiatry placement.   Angelita Ingles, MD 02/04/2017, 11:38 AM

## 2017-02-05 DIAGNOSIS — R45850 Homicidal ideations: Secondary | ICD-10-CM

## 2017-02-05 LAB — GLUCOSE, CAPILLARY
GLUCOSE-CAPILLARY: 375 mg/dL — AB (ref 65–99)
GLUCOSE-CAPILLARY: 294 mg/dL — AB (ref 65–99)
Glucose-Capillary: 139 mg/dL — ABNORMAL HIGH (ref 65–99)
Glucose-Capillary: 167 mg/dL — ABNORMAL HIGH (ref 65–99)
Glucose-Capillary: 275 mg/dL — ABNORMAL HIGH (ref 65–99)
Glucose-Capillary: 283 mg/dL — ABNORMAL HIGH (ref 65–99)
Glucose-Capillary: 323 mg/dL — ABNORMAL HIGH (ref 65–99)
Glucose-Capillary: 403 mg/dL — ABNORMAL HIGH (ref 65–99)

## 2017-02-05 LAB — CBC
HCT: 34.9 % — ABNORMAL LOW (ref 36.0–46.0)
Hemoglobin: 11.6 g/dL — ABNORMAL LOW (ref 12.0–15.0)
MCH: 29.6 pg (ref 26.0–34.0)
MCHC: 33.2 g/dL (ref 30.0–36.0)
MCV: 89 fL (ref 78.0–100.0)
Platelets: 268 10*3/uL (ref 150–400)
RBC: 3.92 MIL/uL (ref 3.87–5.11)
RDW: 14.4 % (ref 11.5–15.5)
WBC: 13.2 10*3/uL — ABNORMAL HIGH (ref 4.0–10.5)

## 2017-02-05 MED ORDER — LISINOPRIL 10 MG PO TABS
10.0000 mg | ORAL_TABLET | Freq: Every day | ORAL | Status: DC
  Administered 2017-02-05 – 2017-02-12 (×8): 10 mg via ORAL
  Filled 2017-02-05 (×9): qty 1

## 2017-02-05 MED ORDER — RITUXIMAB CHEMO INJECTION 500 MG/50ML
1000.0000 mg | Freq: Once | INTRAVENOUS | Status: DC
  Filled 2017-02-05: qty 100

## 2017-02-05 MED ORDER — RITUXIMAB CHEMO INJECTION 500 MG/50ML
1000.0000 mg | Freq: Once | INTRAVENOUS | Status: AC
  Administered 2017-02-06: 1000 mg via INTRAVENOUS

## 2017-02-05 MED ORDER — LABETALOL HCL 5 MG/ML IV SOLN
5.0000 mg | INTRAVENOUS | Status: DC | PRN
Start: 2017-02-05 — End: 2017-02-10
  Administered 2017-02-05: 5 mg via INTRAVENOUS
  Filled 2017-02-05: qty 4

## 2017-02-05 MED ORDER — METOPROLOL TARTRATE 5 MG/5ML IV SOLN
5.0000 mg | Freq: Once | INTRAVENOUS | Status: AC
  Administered 2017-02-05: 5 mg via INTRAVENOUS
  Filled 2017-02-05: qty 5

## 2017-02-05 MED ORDER — INSULIN GLARGINE 100 UNIT/ML ~~LOC~~ SOLN
32.0000 [IU] | Freq: Every day | SUBCUTANEOUS | Status: DC
  Administered 2017-02-05 – 2017-02-06 (×2): 32 [IU] via SUBCUTANEOUS
  Filled 2017-02-05 (×3): qty 0.32

## 2017-02-05 NOTE — Progress Notes (Signed)
CSW continuing to follow for discharge planning. CSW contacted Chrissie Noa in Admissions at [facility] and patient remains on waitlist.   CSW will continue to follow.  Blenda Nicely, [place] Clinical Social Worker [phone]

## 2017-02-05 NOTE — Progress Notes (Addendum)
(  BP before 189/95) BP after given PRN metoprolol and PO scheduled Hydralazine remains high at 182/89.  Please advise.

## 2017-02-05 NOTE — Progress Notes (Signed)
Physical Therapy Treatment Patient Details Name: Debra [name] MRN: [id number] DOB: 06/18/1965 Today's Date: 02/05/2017    History of Present Illness Pt is a 51 y/o female admitted  from [facility] secondary to MS exacerbation not responding to tx as well as SI and MDD.  Pt transferred to [facility] for insertion of R IJ catheter and plasmophoresis treatments. Pt also very depressed about recent decline in function and psychiatry following. Pt only completed 4/5 PLEX and was discontinued (possible infection acquired). PMH includes MS, HTN, DM with neuropathy, depression, heart murmur, and restless leg; MRI 02/01/17 = Regression of demyelinating lesions since September at the C2,C6, and T10 spinal cord levels.    PT Comments    Pt received semirecumbent tin bed watching TV, sitting in room. Session held this AM due to increased HTN 218/112/mmHg, since improved. Pt is pleasant, conversational, and agreeable to participation. Session focused on basic LE strengthening routine, AA/ROM to assist with isolated muscle activation in groups required for improved functional mobility and transfers. Pt remains quite weak, requiring min-modA physical assist to perform heel slides, and SAQ, and has significant weakness and difficult performing combined hip/knee extension during exercises.      Follow Up Recommendations  SNF;Other (comment);Supervision/Assistance - 24 hour     Equipment Recommendations  None recommended by PT;Other (comment)(To be determined by facility)    Recommendations for Other Services       Precautions / Restrictions Precautions Precautions: Fall Precaution Comments: suicide precautions; watch BP Restrictions Weight Bearing Restrictions: No    Mobility  Bed Mobility Overal bed mobility: Needs Assistance Bed Mobility: Rolling Rolling: Mod assist(Performs UE rolling inititation with min-modA at scapula, 10x bilat )            Transfers                     Ambulation/Gait                 Stairs            Wheelchair Mobility    Modified Rankin (Stroke Patients Only)       Balance                                            Cognition Arousal/Alertness: Awake/alert Behavior During Therapy: WFL for tasks assessed/performed Overall Cognitive Status: Within Functional Limits for tasks assessed                                        Exercises General Exercises - Lower Extremity Ankle Circles/Pumps: AROM;Both;15 reps;Supine Short Arc Quad: AAROM;Supine;Both;15 reps Heel Slides: AAROM;15 reps;Both;Supine Hip ABduction/ADduction: AAROM;Supine;15 reps;Both Mini-Sqauts: Supine;15 reps;Both;Strengthening(AR/ROM, (supine single leg leg pres, manually resisted) ) Other Exercises Other Exercises: Bridging Gluteal Sets: 1x15 bilat     General Comments        Pertinent Vitals/Pain Faces Pain Scale: Hurts a little bit Pain Location: BLE- hips to feet Pain Descriptors / Indicators: Burning;Sharp Pain Intervention(s): Limited activity within patient's tolerance;Monitored during session    Home Living                      Prior Function            PT Goals (current goals can now be found in  the care plan section) Acute Rehab PT Goals Patient Stated Goal: Get stronger  PT Goal Formulation: With patient Time For Goal Achievement: 02/06/17 Potential to Achieve Goals: Fair Progress towards PT goals: Progressing toward goals    Frequency    Min 3X/week      PT Plan Current plan remains appropriate    Co-evaluation              AM-PAC PT "6 Clicks" Daily Activity  Outcome Measure  Difficulty turning over in bed (including adjusting bedclothes, sheets and blankets)?: Unable Difficulty moving from lying on back to sitting on the side of the bed? : Unable Difficulty sitting down on and standing up from a chair with arms (e.g., wheelchair, bedside commode,  etc,.)?: Unable Help needed moving to and from a bed to chair (including a wheelchair)?: Total Help needed walking in hospital room?: Total Help needed climbing 3-5 steps with a railing? : Total 6 Click Score: 6    End of Session   Activity Tolerance: Patient tolerated treatment well;Patient limited by fatigue Patient left: in bed;with call bell/phone within reach;with nursing/sitter in room;Other (comment)(heels floated, in chaired-out positon. ) Nurse Communication: Mobility status PT Visit Diagnosis: Unsteadiness on feet (R26.81);Muscle weakness (generalized) (M62.81);Other symptoms and signs involving the nervous system (R29.898);Difficulty in walking, not elsewhere classified (R26.2)     Time: 1287-8676 PT Time Calculation (min) (ACUTE ONLY): 27 min  Charges:  $Therapeutic Exercise: 23-37 mins                    G Codes:       3:19 PM, Feb 23, 2017 Rosamaria Lints, PT, DPT [facility] Physical Therapist - [place] [phone] (Pager)  [phone] (Mobile)  [phone] (Office)      Consetta Cosner C 2017/02/23, 3:15 PM

## 2017-02-05 NOTE — Progress Notes (Deleted)
Internal Medicine Attending:   I saw and examined the patient. I reviewed the resident's note and I agree with the resident's findings and plan as documented in the resident's note.  Notably hypertensive this morning- she is currently treated with Metoprolol 100mg  daily, Amlodipine 10mg  daily, Hydralazine 100mg  TID.  HTN may be contributed to by high dose steroids.  Given her DM would add 10mg  of lisinopril today. Discussed her care with Dr Amada Jupiter, will start Rituxamab today, will need next injection in 14 days, will need to ensure [facility] can administer this medication.

## 2017-02-05 NOTE — Progress Notes (Signed)
   Subjective:  Patient with stable mood today , Still some leg pain, vision is stable, continues to feel weaker in lower extremities today. Denies any chest pain or SOB.  Discussed with her how she has been having some higher bp's which is likely due to the steroids, she was asymptomatic from these but discussed we would be changing her medications some today.  Objective:  Vital signs in last 24 hours: Vitals:   02/05/17 0653 02/05/17 0800 02/05/17 0820 02/05/17 1052  BP: (!) 182/89 (!) 218/112 (!) 206/108 (!) 184/84  Pulse: 71 71  71  Resp:  16    Temp:  98.7 F (37.1 C)    TempSrc:  Oral    SpO2:  100%  100%  Weight:      Height:       Constitutional: NAD CV: RRR, 2/6 systolic murmur Resp: CTAB, no increased work of breathing Abd: soft, NDNT MSK, neuro: Sensation intact throughout, UE strength intact, RLE 3/5 strength, LLE 2/5  Assessment/Plan:  Principal Problem:   Major depressive disorder, single episode, severe without psychosis (HCC) Active Problems:   Vitamin D deficiency   Multiple sclerosis (HCC)  MDD with persistent suicidal ideation: Unchanged.  --stable mood today --Continue Cymbalta 60mg  daily, and Remeron 45mg  qhs --Pending placement  MS, potential NMO: This was presenting problem initially. S/p treatment but not on maintenance therapy which is hindering psych placement esp since patient was admitted from psych to [facility] initially due to MS flare.  --Pts MRI 12/5  shows new enhancing lesions.  Given aggressive course of pts MS neurology is choosing Rituximab as pts maintenance therapy, solumedrol high dose course complete. --Vitamin D low, replacing --Liver function normal, ID workup neg quantiferon resulted  02/05/17, switching to rituximab today --pt seemingly with worse LE weakness for the last 2 days  T1DM: --decreased  Lantus to 32 units qhs, --will leave meal time as Novolog 20 units TID AC, SSI  resistant every 4 hours will decrease mealtime doses  some tomorrow  HTN:  --on Hydralazine 100mg  TID, metoprolol succinate 100mg  daily, amlodipine 10mg  daily --bp high today despite regimen above, will add lisinopril 10mg  daily, after steroids wash out will likely require less antihypertensive therapy   Dispo: Anticipated discharge pending inpatient psychiatry placement.   Angelita Ingles, MD 02/05/2017, 2:13 PM

## 2017-02-05 NOTE — Progress Notes (Addendum)
BP manual at 0800,  218/112 at 0815, 208/108. Pt is aox4, calm, neuro intacts, no complaints. Pupils 3, non reactive to light. MD notified, awaiting PRN orders  BP 184/84 at 1050, MD notified

## 2017-02-05 NOTE — Consult Note (Addendum)
[facility] Psych Consult Progress Note  02/05/2017 6:17 PM Debra [name]  MRN:  [id number] Subjective:  Debra [name] reports that she is doing okay. She discussed numerous topics today and appeared brighter. She does report that she has ongoing SI due to lost of independence with MS. She reports working with PT and hopes that a new steroid medication will help improve her strength. She reports poor appetite for the past 2 days due to lack of options for hospital food. She denies problems with sleep today. She took Ambien overnight with good effect.  Principal Problem: Major depressive disorder, single episode, severe without psychosis (HCC) Diagnosis:   Patient Active Problem List   Diagnosis Date Noted  . Major depressive disorder, single episode, severe without psychosis (HCC) [F32.2] 01/02/2017  . Multiple sclerosis exacerbation (HCC) [G35] 01/01/2017  . Dysthymia [F34.1] 12/27/2016  . Major depressive disorder, recurrent, severe with psychotic features (HCC) [F33.3]   . Multiple joint pain [M25.50]   . HTN (hypertension) [I10]   . Acute pain of left knee [M25.562]   . Neurogenic bladder [N31.9]   . Acute lower UTI [N39.0]   . Labile blood pressure [R09.89]   . Acute blood loss anemia [D62]   . Leukocytosis [D72.829]   . Labile blood glucose [R73.09]   . Hypertensive crisis [I16.9]   . Hypoglycemia [E16.2]   . Poorly controlled type 2 diabetes mellitus with peripheral neuropathy (HCC) [E11.42, E11.65]   . Uncontrolled hypertension [I10]   . Sleep disturbance [G47.9]   . Multiple sclerosis (HCC) [G35] 11/22/2016  . Thoracic root lesion [G54.3] 11/22/2016  . MS (multiple sclerosis) (HCC) [G35]   . Neuropathic pain [M79.2]   . Uncontrolled type 2 diabetes mellitus with peripheral neuropathy (HCC) [E11.42, E11.65]   . Urinary retention [R33.9]   . Medically noncompliant [Z91.19]   . Leg weakness, bilateral [R29.898] 11/20/2016  . Diabetic ketoacidosis without coma associated with type  1 diabetes mellitus (HCC) [E10.10]   . Leg pain [M79.606]   . History of CVA (cerebrovascular accident) [Z86.73]   . 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 Benign essential HTN [I10]   . Hypertensive urgency [I16.0]   . AKI (acute kidney injury) (HCC) [N17.9]   . Fibromyalgia [M79.7] 11/07/2016  . Back pain [M54.9] 10/31/2016  . Stable angina pectoris (HCC) [I20.8]   . Vitamin D deficiency [E55.9] 08/31/2014  . 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 Hyperlipidemia [E78.5] 10/06/2013  . Depression [F32.9] 10/16/2012  . Bilateral knee pain [M25.561, M25.562] 02/16/2012  . Chronic diarrhea [K52.9] 10/19/2010  . Peripheral neuropathy [G62.9] 08/19/2010  . Type 1 diabetes mellitus (HCC) [E10.9] 07/06/2010  . Hypertension [I10] 07/06/2010  . Asthma [J45.909] 07/06/2010  . Migraine [G43.909] 07/06/2010   Total Time spent with patient: 15 minutes  Past Psychiatric History: Depression  Past Medical History:  Past Medical History:  Diagnosis Date  . Adenomatous colonic polyps   . Anemia    2005  . Anxiety    1990  . Arthritis   . Asthma    2000  . Cataract   . Depression 1990  . Difficult intubation    narrow airway  . Gastroesophageal Reflux Disease (GERD)   . Heart murmur    Birth  . Hyperlipidemia    2005  . Hypertension    1998  . Internal hemorrhoids 04/24/06   on colonoscopy  . Migraine   . Neuropathy of the  hands & feet   . Restless Leg Syndrome   . Right Ankle Fracture 10/06/2013  . Sleep paralysis   . Stable angina pectoris    2007: cath showing normal cors.   . Stroke 1990  . Type I Diabetes Mellitus 1988    Past Surgical History:  Procedure Laterality Date  . bilateral foot surgery    . BREAST SURGERY Left    biopsy left breast  . CARDIAC CATHETERIZATION  2007   around 2007 or 2008  . CESAREAN SECTION    . CHOLECYSTECTOMY    . COLONOSCOPY W/  BIOPSIES AND POLYPECTOMY    . DILATION AND CURETTAGE OF UTERUS  12/29/2010   Surgeon: Tereso Newcomer, MD;  Location: [facility];  Service: Gynecology  . ENDOMETRIAL ABLATION W/ NOVASURE N/A 12/2009  . EYE SURGERY    . HYSTEROSCOPY  12/29/2010   Procedure: HYSTEROSCOPY WITH HYDROTHERMAL ABLATION;  Surgeon: Tereso Newcomer, MD;  Location: [facility];  Service: Gynecology;;  . IUD REMOVAL  12/29/2010   Procedure: INTRAUTERINE DEVICE (IUD) REMOVAL;  Surgeon: Tereso Newcomer, MD;  Location: [facility];  Service: Gynecology;  Laterality: N/A;  . LAPAROSCOPIC TUBAL LIGATION  12/29/2010   Procedure: LAPAROSCOPIC TUBAL LIGATION;  Surgeon: Tereso Newcomer, MD;  Location: [facility];  Service: Gynecology;  Laterality: Bilateral;  . ORIF ANKLE FRACTURE Right 10/09/2013   Procedure: Open reduction internal fixation right ankle;  Surgeon: Mable Paris, MD;  Location: [facility] OR;  Service: Orthopedics;  Laterality: Right;  Open reduction internal fixation right ankle  . TRIGGER FINGER RELEASE     x 3   Family History:  Family History  Problem Relation Age of Onset  . Hypertension Mother   . Kidney disease Mother   . Hypertension Father   . Breast cancer Maternal Grandmother   . Prostate cancer Maternal Grandfather   . Ovarian cancer Paternal Grandmother   . Prostate cancer Paternal Grandfather   . Colon cancer Maternal Uncle        Family history of malignant neoplasm of gastrointestinal tract   Family Psychiatric  History: Denies Social History:  Social History   Substance and Sexual Activity  Alcohol Use No  . Alcohol/week: 0.0 oz     Social History   Substance and Sexual Activity  Drug Use No   Comment: drug addict    Social History   Socioeconomic History  . Marital status: Divorced    Spouse name: None  . Number of children: 2  . Years of education: 21  . Highest education level: None  Social Needs  . Financial resource strain: None  . Food insecurity - worry: None  . Food  insecurity - inability: None  . Transportation needs - medical: None  . Transportation needs - non-medical: None  Occupational History  . Occupation: Unemployed  Tobacco Use  . Smoking status: Never Smoker  . Smokeless tobacco: Never Used  Substance and Sexual Activity  . Alcohol use: No    Alcohol/week: 0.0 oz  . Drug use: No    Comment: drug addict  . Sexual activity: Yes    Birth control/protection: Other-see comments    Comment: pt states she had ablation in 2011  Other Topics Concern  . None  Social History Narrative   Lost medicaid about 2009 ish when her youngest child turned 75. Has adult children. Lives with boyfriend who financially supports her. Has attempted to get disability but has been turned down.  2-3 caffeine drinks daily     Sleep: Good  Appetite:  Poor  Current Medications: Current Facility-Administered Medications  Medication Dose Route Frequency Provider Last Rate Last Dose  . acetaminophen (TYLENOL) tablet 650 mg  650 mg Oral Q4H PRN Charm Rings, NP   650 mg at 01/16/17 2229  . albuterol (PROVENTIL) (2.5 MG/3ML) 0.083% nebulizer solution 2.5 mg  2.5 mg Inhalation Q4H PRN Charm Rings, NP      . alum & mag hydroxide-simeth (MAALOX/MYLANTA) 200-200-20 MG/5ML suspension 30 mL  30 mL Oral Q4H PRN Charm Rings, NP      . amLODipine (NORVASC) tablet 10 mg  10 mg Oral Daily [name], MD   10 mg at 02/05/17 0856  . aspirin EC tablet 81 mg  81 mg Oral Daily [name], MD   81 mg at 02/05/17 0901  . atorvastatin (LIPITOR) tablet 40 mg  40 mg Oral Daily [name], MD   40 mg at 02/05/17 0900  . baclofen (LIORESAL) tablet 5 mg  5 mg Oral TID [name], MD   5 mg at 02/05/17 1807  . bethanechol (URECHOLINE) tablet 50 mg  50 mg Oral TID Charm Rings, NP   50 mg at 02/05/17 1807  . DULoxetine (CYMBALTA) DR capsule 60 mg  60 mg Oral Daily [name], MD   60 mg at 02/05/17 0900  . feeding supplement (BOOST / RESOURCE BREEZE)  liquid 1 Container  1 Container Oral BID BM [name], MD   1 Container at 02/05/17 1615  . heparin injection 5,000 Units  5,000 Units Subcutaneous Q8H Deneise Lever, MD   5,000 Units at 02/05/17 1421  . hydrALAZINE (APRESOLINE) tablet 100 mg  100 mg Oral Q8H [name], MD   100 mg at 02/05/17 1423  . insulin aspart (novoLOG) injection 0-20 Units  0-20 Units Subcutaneous Q4H Angelita Ingles, MD   20 Units at 02/05/17 1806  . insulin aspart (novoLOG) injection 20 Units  20 Units Subcutaneous TID WC Angelita Ingles, MD   20 Units at 02/05/17 1805  . insulin glargine (LANTUS) injection 32 Units  32 Units Subcutaneous QHS Chana Bode B, MD      . labetalol (NORMODYNE,TRANDATE) injection 5 mg  5 mg Intravenous Q2H PRN Angelita Ingles, MD   5 mg at 02/05/17 1135  . lisinopril (PRINIVIL,ZESTRIL) tablet 10 mg  10 mg Oral Daily Angelita Ingles, MD   10 mg at 02/05/17 1808  . magnesium hydroxide (MILK OF MAGNESIA) suspension 30 mL  30 mL Oral Daily PRN Charm Rings, NP   30 mL at 01/29/17 2343  . metoprolol succinate (TOPROL-XL) 24 hr tablet 100 mg  100 mg Oral Daily [name], MD   100 mg at 02/05/17 1133  . mirtazapine (REMERON) tablet 45 mg  45 mg Oral QHS [name], MD   45 mg at 02/04/17 2052  . ondansetron (ZOFRAN) tablet 4 mg  4 mg Oral Q8H PRN Charm Rings, NP   4 mg at 01/05/17 1211  . pantoprazole (PROTONIX) EC tablet 40 mg  40 mg Oral Daily [name], MD   40 mg at 02/05/17 0902  . polyethylene glycol (MIRALAX / GLYCOLAX) packet 17 g  17 g Oral Daily PRN [name], MD      . pregabalin (LYRICA) capsule 100 mg  100 mg Oral TID [name], MD   100 mg at 02/05/17 1807  . [START ON 02/06/2017] riTUXimab (RITUXAN)  1,000 mg in sodium chloride 0.9 % 250 mL infusion  1,000 mg Intravenous Once Carlynn Purl C, DO      . sodium chloride flush (NS) 0.9 % injection 10-40 mL  10-40 mL Intracatheter PRN [name], MD   3 mL at 02/04/17 2131  .  sodium chloride flush (NS) 0.9 % injection 3 mL  3 mL Intravenous Q12H Deneise Lever, MD   3 mL at 02/05/17 1000  . traMADol (ULTRAM) tablet 50 mg  50 mg Oral Q6H PRN [name], MD   50 mg at 02/04/17 2109  . Vitamin D (Ergocalciferol) (DRISDOL) capsule 50,000 Units  50,000 Units Oral Q7 days Angelita Ingles, MD   50,000 Units at 02/01/17 1406  . zolpidem (AMBIEN) tablet 5 mg  5 mg Oral QHS PRN Charm Rings, NP   5 mg at 02/04/17 0113    Lab Results:  Results for orders placed or performed during the hospital encounter of 01/02/17 (from the past 48 hour(s))  Glucose, capillary     Status: Abnormal   Collection Time: 02/03/17  8:17 PM  Result Value Ref Range   Glucose-Capillary 182 (H) 65 - 99 mg/dL   Comment 1 Notify RN    Comment 2 Document in Chart   Glucose, capillary     Status: Abnormal   Collection Time: 02/04/17 12:51 AM  Result Value Ref Range   Glucose-Capillary 161 (H) 65 - 99 mg/dL  Glucose, capillary     Status: Abnormal   Collection Time: 02/04/17  4:30 AM  Result Value Ref Range   Glucose-Capillary 222 (H) 65 - 99 mg/dL  Glucose, capillary     Status: Abnormal   Collection Time: 02/04/17  8:05 AM  Result Value Ref Range   Glucose-Capillary 188 (H) 65 - 99 mg/dL  Glucose, capillary     Status: Abnormal   Collection Time: 02/04/17 11:39 AM  Result Value Ref Range   Glucose-Capillary 177 (H) 65 - 99 mg/dL  Glucose, capillary     Status: Abnormal   Collection Time: 02/04/17  4:39 PM  Result Value Ref Range   Glucose-Capillary 205 (H) 65 - 99 mg/dL  Glucose, capillary     Status: Abnormal   Collection Time: 02/04/17  8:05 PM  Result Value Ref Range   Glucose-Capillary 318 (H) 65 - 99 mg/dL   Comment 1 Notify RN    Comment 2 Document in Chart   Glucose, capillary     Status: Abnormal   Collection Time: 02/05/17 12:17 AM  Result Value Ref Range   Glucose-Capillary 283 (H) 65 - 99 mg/dL   Comment 1 Notify RN    Comment 2 Document in Chart   Glucose,  capillary     Status: Abnormal   Collection Time: 02/05/17  3:56 AM  Result Value Ref Range   Glucose-Capillary 323 (H) 65 - 99 mg/dL  Glucose, capillary     Status: Abnormal   Collection Time: 02/05/17  4:44 AM  Result Value Ref Range   Glucose-Capillary 275 (H) 65 - 99 mg/dL  Glucose, capillary     Status: Abnormal   Collection Time: 02/05/17  8:36 AM  Result Value Ref Range   Glucose-Capillary 167 (H) 65 - 99 mg/dL   Comment 1 Notify RN    Comment 2 Document in Chart   Glucose, capillary     Status: Abnormal   Collection Time: 02/05/17 11:34 AM  Result Value Ref Range   Glucose-Capillary 139 (H) 65 - 99  mg/dL   Comment 1 Notify RN    Comment 2 Document in Chart   CBC     Status: Abnormal   Collection Time: 02/05/17  1:46 PM  Result Value Ref Range   WBC 13.2 (H) 4.0 - 10.5 K/uL   RBC 3.92 3.87 - 5.11 MIL/uL   Hemoglobin 11.6 (L) 12.0 - 15.0 g/dL   HCT 16.1 (L) 09.6 - 04.5 %   MCV 89.0 78.0 - 100.0 fL   MCH 29.6 26.0 - 34.0 pg   MCHC 33.2 30.0 - 36.0 g/dL   RDW 40.9 81.1 - 91.4 %   Platelets 268 150 - 400 K/uL  Glucose, capillary     Status: Abnormal   Collection Time: 02/05/17  2:05 PM  Result Value Ref Range   Glucose-Capillary 375 (H) 65 - 99 mg/dL   Comment 1 Notify RN    Comment 2 Document in Chart   Glucose, capillary     Status: Abnormal   Collection Time: 02/05/17  4:55 PM  Result Value Ref Range   Glucose-Capillary 403 (H) 65 - 99 mg/dL   Comment 1 Notify RN     Blood Alcohol level:  Lab Results  Component Value Date   ETH <10 12/20/2016   ETH  12/09/2006    <5        LOWEST DETECTABLE LIMIT FOR SERUM ALCOHOL IS 11 mg/dL FOR MEDICAL PURPOSES ONLY   Musculoskeletal: Strength & Muscle Tone: decreased secondary to MS.  Gait & Station: Unable to assess since patient was lying in bed. Patient leans: N/A  Psychiatric Specialty Exam: Physical Exam  Nursing note and vitals reviewed. Constitutional: She is oriented to person, place, and time. She  appears well-developed and well-nourished.  HENT:  Head: Normocephalic and atraumatic.  Neck: Normal range of motion.  Respiratory: Effort normal.  Musculoskeletal: Normal range of motion.  Neurological: She is alert and oriented to person, place, and time.  Skin: No rash noted.  Psychiatric: She has a normal mood and affect. Her speech is normal and behavior is normal. Judgment and thought content normal. Cognition and memory are normal.    Review of Systems  Neurological: Positive for weakness.  Psychiatric/Behavioral: Positive for depression and suicidal ideas. Negative for hallucinations and substance abuse. The patient has insomnia. The patient is not nervous/anxious.   All other systems reviewed and are negative.   Blood pressure (!) 147/59, pulse 74, temperature 98.8 F (37.1 C), temperature source Oral, resp. rate 18, height 5\' 4"  (1.626 m), weight 72 kg (158 lb 11.7 oz), SpO2 100 %.Body mass index is 27.25 kg/m.  General Appearance: Well Groomed, African American female who is lying in bed in a hospital gown with corrective lenses and hair braided. NAD  Eye Contact:  Good  Speech:  Clear and Coherent and Normal Rate  Volume:  Normal  Mood:  Okay  Affect:  Full Range  Thought Process:  Goal Directed and Linear  Orientation:  Full (Time, Place, and Person)  Thought Content:  Logical  Suicidal Thoughts:  Yes.  without intent/plan  Homicidal Thoughts:  No  Memory:  Immediate;   Good Recent;   Good Remote;   Good  Judgement:  Fair  Insight:  Fair  Psychomotor Activity:  Normal  Concentration:  Concentration: Good and Attention Span: Good  Recall:  Good  Fund of Knowledge:  Good  Language:  Good  Akathisia:  No  Handed:  Right  AIMS (if indicated):   N/A  Assets:  Communication Skills Desire for Improvement Housing  ADL's:  Impaired  Cognition:  WNL  Sleep:   Okay    Assessment:  Debra [name] is a 51 y.o. female [facility]  [facility] MS with plasma exchange treatment due to worsening vision that was not improved with Solu-Medrol.She has had improvement of MS symptoms butcontinues to endorse SI with depressive symptoms that [facility].She does appear brighter today although she continues to report SI secondary to medical condition. Shecontinues towarrant inpatient psychiatric hospitalization once she is medically cleared due to high risk of harm to self.   Treatment Plan Summary: -Continue bedside sitter due to ongoing SI and risk of harm to self.  -Continue Cymbalta 60 mg daily for depression. -Continue Remeron 45 mgqhsfor sleep, anxiety and depression.  -Patientcontinues torequire inpatient psychiatric hospitalization following medical clearance given continued SI. -Psychiatry will continue to follow as needed.     Cherly BeachJacqueline J Norman, DO 02/05/2017, 6:17 PM

## 2017-02-05 NOTE — Progress Notes (Signed)
Subjective: No changes  Exam: Vitals:   02/05/17 0820 02/05/17 1052  BP: (!) 206/108 (!) 184/84  Pulse:  71  Resp:    Temp:    SpO2:  100%   Gen: In bed, NAD Resp: non-labored breathing, no acute distress Abd: soft, nt  Neuro: MS: awake, alert, interactive and appropriate WG:NFAOZ, EOMI Motor: She has mild 4/5 weakness in the left arm, 5/5 right arm, 3/5 RLE, 2/5 LLE Sensory:intact to LT  Pertinent Labs: Quant interferon B - negative HBV, HCV - negative  Impression: 51 yo F with aggressive MS. The plan is for her to get rituximab therapy and I feel that this is reasonable. This will ensure compliance and given how aggressive her course has been I think that this would be a good option. Ocrelizumab would be an option as an outpatient and she could be switched to this in 6 months(next time she would be due for treatment).   Recommendations: 1) Rituxan 1000mg  x1 2) Repeat rituxan infusion in 15 days, 1000mg   3) She will need repeat CBC prior to her next infusion 4) Follow up with Dr. Epimenio Foot once discharged from psychiatric hospital.   Ritta Slot, MD [facility] [phone]  If 7pm- 7am, please page neurology on call as listed in AMION.

## 2017-02-05 NOTE — Progress Notes (Signed)
CBG 403. MD notified. Will continue to monitor

## 2017-02-06 DIAGNOSIS — I952 Hypotension due to drugs: Secondary | ICD-10-CM

## 2017-02-06 DIAGNOSIS — T451X5A Adverse effect of antineoplastic and immunosuppressive drugs, initial encounter: Secondary | ICD-10-CM

## 2017-02-06 LAB — GLUCOSE, CAPILLARY
GLUCOSE-CAPILLARY: 152 mg/dL — AB (ref 65–99)
GLUCOSE-CAPILLARY: 179 mg/dL — AB (ref 65–99)
GLUCOSE-CAPILLARY: 180 mg/dL — AB (ref 65–99)
GLUCOSE-CAPILLARY: 231 mg/dL — AB (ref 65–99)
GLUCOSE-CAPILLARY: 340 mg/dL — AB (ref 65–99)
GLUCOSE-CAPILLARY: 393 mg/dL — AB (ref 65–99)
Glucose-Capillary: 185 mg/dL — ABNORMAL HIGH (ref 65–99)
Glucose-Capillary: 202 mg/dL — ABNORMAL HIGH (ref 65–99)

## 2017-02-06 LAB — CBC
HCT: 32 % — ABNORMAL LOW (ref 36.0–46.0)
Hemoglobin: 10.5 g/dL — ABNORMAL LOW (ref 12.0–15.0)
MCH: 29.3 pg (ref 26.0–34.0)
MCHC: 32.8 g/dL (ref 30.0–36.0)
MCV: 89.4 fL (ref 78.0–100.0)
PLATELETS: 235 10*3/uL (ref 150–400)
RBC: 3.58 MIL/uL — ABNORMAL LOW (ref 3.87–5.11)
RDW: 14.8 % (ref 11.5–15.5)
WBC: 13.5 10*3/uL — ABNORMAL HIGH (ref 4.0–10.5)

## 2017-02-06 MED ORDER — DIPHENHYDRAMINE HCL 50 MG/ML IJ SOLN
25.0000 mg | Freq: Once | INTRAMUSCULAR | Status: AC
  Administered 2017-02-06: 25 mg via INTRAVENOUS

## 2017-02-06 MED ORDER — METHYLPREDNISOLONE SODIUM SUCC 125 MG IJ SOLR
100.0000 mg | Freq: Once | INTRAMUSCULAR | Status: AC
  Administered 2017-02-06: 100 mg via INTRAVENOUS

## 2017-02-06 MED ORDER — SODIUM CHLORIDE 0.9 % IV BOLUS (SEPSIS)
1000.0000 mL | Freq: Once | INTRAVENOUS | Status: AC
  Administered 2017-02-06: 1000 mL via INTRAVENOUS

## 2017-02-06 MED ORDER — DIPHENHYDRAMINE HCL 50 MG/ML IJ SOLN
INTRAMUSCULAR | Status: AC
  Filled 2017-02-06: qty 1

## 2017-02-06 MED ORDER — METHYLPREDNISOLONE SODIUM SUCC 125 MG IJ SOLR
INTRAMUSCULAR | Status: AC
  Administered 2017-02-06: 100 mg via INTRAVENOUS
  Filled 2017-02-06: qty 2

## 2017-02-06 MED ORDER — INSULIN ASPART 100 UNIT/ML ~~LOC~~ SOLN
18.0000 [IU] | Freq: Three times a day (TID) | SUBCUTANEOUS | Status: DC
  Administered 2017-02-07: 18 [IU] via SUBCUTANEOUS

## 2017-02-06 NOTE — Progress Notes (Signed)
   Subjective:  Called to pts bedside during rounds.  Pt was hypotensive to low 90's and upper 80's systolic, felt cool and clammy after rituximab infusion.  Infusion was stopped, pt able to answer questions and reported feeling poorly.     Objective:  Vital signs in last 24 hours: Vitals:   02/06/17 1220 02/06/17 1221 02/06/17 1237 02/06/17 1321  BP: (!) 99/47 (!) 85/40 (!) 105/43 (!) 109/52  Pulse: 64 60    Resp:      Temp:  97.7 F (36.5 C) 97.6 F (36.4 C)   TempSrc:  Oral Oral   SpO2:  100%    Weight:      Height:       Constitutional: NAD CV: RRR, 2/6 systolic murmur Resp: CTAB, no increased work of breathing Abd: soft, NDNT MSK, neuro: Sensation intact throughout, UE strength intact, RLE 3/5 strength, LLE 2/5  Assessment/Plan:  Principal Problem:   Major depressive disorder, single episode, severe without psychosis (HCC) Active Problems:   Vitamin D deficiency   Multiple sclerosis (HCC)  MDD with persistent suicidal ideation: Unchanged.  --stable mood today --Continue Cymbalta 60mg  daily, and Remeron 45mg  qhs --Pending placement  MS, potential NMO: This was presenting problem initially. S/p treatment but not on maintenance therapy which is hindering psych placement esp since patient was admitted from psych to [facility] initially due to MS flare.  --Pts MRI 12/5  shows new enhancing lesions.  Given aggressive course of pts MS neurology is choosing Rituximab as pts maintenance therapy, solumedrol high dose course complete. --Vitamin D low, replacing --Liver function normal, ID workup neg quantiferon resulted  02/05/17, now on rituximab --pt seemingly with worse LE weakness for the last few days --hypotensive episode this morning due to rituximab admin.  Stopped infusion until BP stabilized, gave 100mg  solumedrol and 25mg  benadryl.  Restarted infusion at 50cc/hr half the original rate of 100.    T1DM: --decreased  Lantus to 32 units qhs, --will decrease meal time to  Novolog 16 units TID AC, SSI  resistant HTN:  --on Hydralazine 100mg  TID, metoprolol succinate 100mg  daily, amlodipine 10mg  daily --bp doing well with addition of lisinopril 10mg  daily.   Dispo: Anticipated discharge pending inpatient psychiatry placement.   Angelita Ingles, MD 02/06/2017, 2:41 PM

## 2017-02-06 NOTE — Progress Notes (Signed)
MD returned call, gave verbal orders to stop chemo meds. Liter bolus of NS running.

## 2017-02-06 NOTE — Progress Notes (Signed)
Subjective: Patient has no complaints at this time.  She has not started rituximab and does have questions about this medication.  First infusion is scheduled for 0800 hours today  Exam: Vitals:   02/06/17 0505 02/06/17 0732  BP: (!) 177/86 (!) 168/86  Pulse: 75 73  Resp: 16 16  Temp: 98.7 F (37.1 C) 98.2 F (36.8 C)  SpO2: 100% 100%    HEENT-  Normocephalic, no lesions, without obvious abnormality.  Normal external eye and conjunctiva.  Normal TM's bilaterally.  Normal auditory canals and external ears. Normal external nose, mucus membranes and septum.  Normal pharynx. Cardiovascular- S1, S2 normal, pulses palpable throughout   Lungs- chest clear, no wheezing, rales, normal symmetric air entry Abdomen- normal findings: bowel sounds normal Extremities- no edema    Neuro: --No significant change in exam today MS: awake, alert, interactive and appropriate YV:OPFYT, EOMI Motor: She has mild 4/5 weakness in the left arm, 5/5 right arm, 3/5 RLE, 2/5 LLE Sensory:intact to LT    Medications:  Scheduled: . amLODipine  10 mg Oral Daily  . aspirin EC  81 mg Oral Daily  . atorvastatin  40 mg Oral Daily  . baclofen  5 mg Oral TID  . bethanechol  50 mg Oral TID  . DULoxetine  60 mg Oral Daily  . feeding supplement  1 Container Oral BID BM  . heparin injection (subcutaneous)  5,000 Units Subcutaneous Q8H  . hydrALAZINE  100 mg Oral Q8H  . insulin aspart  0-20 Units Subcutaneous Q4H  . insulin aspart  18 Units Subcutaneous TID WC  . insulin glargine  32 Units Subcutaneous QHS  . lisinopril  10 mg Oral Daily  . metoprolol succinate  100 mg Oral Daily  . mirtazapine  45 mg Oral QHS  . pantoprazole  40 mg Oral Daily  . pregabalin  100 mg Oral TID  . sodium chloride flush  3 mL Intravenous Q12H  . Vitamin D (Ergocalciferol)  50,000 Units Oral Q7 days   Continuous: . riTUXimab (RITUXAN)  NON-ONCOLOGY  infusion     WKM:[id number], albuterol, alum & mag hydroxide-simeth,  labetalol, magnesium hydroxide, ondansetron, polyethylene glycol, sodium chloride flush, traMADol, zolpidem  Pertinent Labs/Diagnostics: Vitamin D 25-hydroxy was low at 9.55 days ago TB results: serves as a control for the test.   QuantiFERON TB1 Ag Value IU/mL 0.04   QuantiFERON TB2 Ag Value IU/mL 0.03   QuantiFERON Nil Value IU/mL 0.03   QuantiFERON Mitogen Value IU/mL >10.00   01/31/2017 showed a native quant to [place] TB Gold plus lab  Nonreactive HCV antibody screen HBV negative  No results found.   Felicie Morn PA-C [facility] [phone]  As stated in previous note:  Impression: 51 yo F with aggressive MS. The plan is for her to get rituximab therapy and I feel that this is reasonable. This will ensure compliance and given how aggressive her course has been I think that this would be a good option. Ocrelizumab would be an option as an outpatient and she could be switched to this in 6 months(next time she would be due for treatment).   Recommendations: 1) Rituxan 1000mg  x1 2) Repeat rituxan infusion in 15 days, 1000mg   3) She will need repeat CBC prior to her next infusion 4) Follow up with Dr. Epimenio Foot once discharged from psychiatric hospital.   Ritta Slot, MD [facility] [phone]  If 7pm- 7am, please page neurology on call as listed in AMION. 02/06/2017, 8:43 AM  Of note, she  had mild hypotension with rituxan infusion, giving solumedrol, benadryl and will restart at a lower rate in a few hours.   Ritta SlotMcNeill Nastasha Reising, MD [facility] [phone]  If 7pm- 7am, please page neurology on call as listed in AMION.

## 2017-02-06 NOTE — Progress Notes (Addendum)
MD stated to continue with bolus and monitor BP. MD said to restart chemo meds in 2 hours at 50 ml/hr. Will continue to monitor.  REstarted chemo meds at 50 ml/hr per md order  Pt stable, tolerating meds. No concerns at this time @1948 

## 2017-02-06 NOTE — Progress Notes (Signed)
Patient blood pressure gradually lower at this time. Rituxan is currently running, patient did received B/P meds this AM. Patient is asymtomatic. RN called MD to verified rate and hypersensity rx protocol at this time. Awaiting for call back.   Sim Boast, RN

## 2017-02-06 NOTE — Progress Notes (Signed)
Rituxan infusion started. PIV 20g left lateral forarm was flushed and intact. Pt does not c/o of any pain at the site, site look remarkable. Meredith RN remained at the bedside. Reaction protocol was given to the nurse. Education and titration was reviewed with both the nurse and patient. Written education was given to the patient. All questions answered. Debra [name]

## 2017-02-06 NOTE — Plan of Care (Signed)
  Education: Knowledge of General Education information will improve 02/06/2017 0215 - Progressing by Olena Mater, RN Note POC reviewed with pt.

## 2017-02-07 LAB — GLUCOSE, CAPILLARY
GLUCOSE-CAPILLARY: 168 mg/dL — AB (ref 65–99)
GLUCOSE-CAPILLARY: 53 mg/dL — AB (ref 65–99)
GLUCOSE-CAPILLARY: 169 mg/dL — AB (ref 65–99)
Glucose-Capillary: 69 mg/dL (ref 65–99)
Glucose-Capillary: 142 mg/dL — ABNORMAL HIGH (ref 65–99)
Glucose-Capillary: 84 mg/dL (ref 65–99)
Glucose-Capillary: 89 mg/dL (ref 65–99)
Glucose-Capillary: 109 mg/dL — ABNORMAL HIGH (ref 65–99)
Glucose-Capillary: 186 mg/dL — ABNORMAL HIGH (ref 65–99)

## 2017-02-07 MED ORDER — INSULIN GLARGINE 100 UNIT/ML ~~LOC~~ SOLN
28.0000 [IU] | Freq: Every day | SUBCUTANEOUS | Status: DC
  Administered 2017-02-07: 28 [IU] via SUBCUTANEOUS
  Filled 2017-02-07: qty 0.28

## 2017-02-07 MED ORDER — INSULIN ASPART 100 UNIT/ML ~~LOC~~ SOLN: 10.0000 [IU] | Freq: Three times a day (TID) | SUBCUTANEOUS | Status: DC

## 2017-02-07 MED ORDER — INSULIN ASPART 100 UNIT/ML ~~LOC~~ SOLN
0.0000 [IU] | Freq: Every day | SUBCUTANEOUS | Status: DC
  Administered 2017-02-09: 2 [IU] via SUBCUTANEOUS

## 2017-02-07 MED ORDER — INSULIN GLARGINE 100 UNIT/ML ~~LOC~~ SOLN
30.0000 [IU] | Freq: Every day | SUBCUTANEOUS | Status: DC
  Filled 2017-02-07: qty 0.3

## 2017-02-07 MED ORDER — INSULIN ASPART 100 UNIT/ML ~~LOC~~ SOLN
0.0000 [IU] | Freq: Three times a day (TID) | SUBCUTANEOUS | Status: DC
  Administered 2017-02-08 – 2017-02-09 (×2): 5 [IU] via SUBCUTANEOUS
  Administered 2017-02-09: 8 [IU] via SUBCUTANEOUS
  Administered 2017-02-10: 3 [IU] via SUBCUTANEOUS
  Administered 2017-02-10: 5 [IU] via SUBCUTANEOUS
  Administered 2017-02-11: 3 [IU] via SUBCUTANEOUS
  Administered 2017-02-11: 5 [IU] via SUBCUTANEOUS
  Administered 2017-02-12: 2 [IU] via SUBCUTANEOUS
  Administered 2017-02-12: 5 [IU] via SUBCUTANEOUS
  Administered 2017-02-12: 3 [IU] via SUBCUTANEOUS
  Administered 2017-02-13: 5 [IU] via SUBCUTANEOUS
  Administered 2017-02-13: 2 [IU] via SUBCUTANEOUS
  Administered 2017-02-14: 3 [IU] via SUBCUTANEOUS
  Administered 2017-02-14: 5 [IU] via SUBCUTANEOUS
  Administered 2017-02-15: 2 [IU] via SUBCUTANEOUS

## 2017-02-07 MED ORDER — INSULIN ASPART 100 UNIT/ML ~~LOC~~ SOLN: 12.0000 [IU] | Freq: Three times a day (TID) | SUBCUTANEOUS | Status: DC

## 2017-02-07 MED ORDER — GLUCOSE 40 % PO GEL
ORAL | Status: AC
  Filled 2017-02-07: qty 1

## 2017-02-07 MED ORDER — PREMIER PROTEIN SHAKE
11.0000 [oz_av] | Freq: Two times a day (BID) | ORAL | Status: DC
  Administered 2017-02-07 – 2017-02-14 (×8): 11 [oz_av] via ORAL
  Filled 2017-02-07 (×23): qty 325.31

## 2017-02-07 MED ORDER — INSULIN ASPART 100 UNIT/ML ~~LOC~~ SOLN
8.0000 [IU] | Freq: Three times a day (TID) | SUBCUTANEOUS | Status: DC
  Administered 2017-02-08 – 2017-02-15 (×18): 8 [IU] via SUBCUTANEOUS

## 2017-02-07 NOTE — Discharge Summary (Signed)
Name: Debra [name] MRN: [id number] DOB: [birth year] 51 y.o. PCP: Debra Lagos, MD  Date of Admission: 01/02/2017 11:40 AM Date of Discharge: 02/15/2017 Attending Physician: Debra Shutter, MD  Discharge Diagnosis: 1.  Principal Problem:   Major depressive disorder, single episode, severe without psychosis (HCC) Active Problems:   Vitamin D deficiency   Multiple sclerosis (HCC)   Discharge Medications: Allergies as of 02/15/2017      Reactions   Penicillins Anaphylaxis, Nausea And Vomiting, Rash   Has patient had a PCN reaction causing immediate rash, facial/tongue/throat swelling, SOB or lightheadedness with hypotension: Yes Has patient had a PCN reaction causing severe rash involving mucus membranes or skin necrosis: Yes Has patient had a PCN reaction that required hospitalization No Has patient had a PCN reaction occurring within the last 10 years: Yes If all of the above answers are "NO", then may proceed with Cephalosporin use.   Pollen Extract    Seasonal Allergies   Tape Rash      Medication List    STOP taking these medications   ciprofloxacin 500 MG tablet Commonly known as:  CIPRO     TAKE these medications   AGAMATRIX PRESTO PRO METER Devi The patient is insulin requiring, ICD 10 code E10.9. The patient tests 4 times per day.   amLODipine 10 MG tablet Commonly known as:  NORVASC Take 1 tablet (10 mg total) by mouth daily. What changed:    medication strength  how much to take   aspirin EC 81 MG tablet Take 1 tablet (81 mg total) by mouth daily.   atorvastatin 40 MG tablet Commonly known as:  LIPITOR Take 1 tablet (40 mg total) by mouth daily.   Baclofen 5 MG Tabs Take 5 mg by mouth 3 (three) times daily.   bethanechol 50 MG tablet Commonly known as:  URECHOLINE Take 1 tablet (50 mg total) by mouth 3 (three) times daily. What changed:    medication strength  how much to take   DULoxetine 60 MG capsule Commonly known as:   CYMBALTA Take 1 capsule (60 mg total) daily by mouth.   feeding supplement (GLUCERNA SHAKE) Liqd Take 237 mLs by mouth 2 (two) times daily between meals.   gabapentin 100 MG capsule Commonly known as:  NEURONTIN Take 1 capsule (100 mg total) 3 (three) times daily by mouth.   hydrALAZINE 25 MG tablet Commonly known as:  APRESOLINE Take 1 tablet (25 mg total) by mouth every 8 (eight) hours as needed (systolic goal 140).   insulin aspart 100 UNIT/ML injection Commonly known as:  novoLOG Inject 8 Units 3 (three) times daily with meals into the skin. What changed:  Another medication with the same name was added. Make sure you understand how and when to take each.   insulin aspart 100 UNIT/ML injection Commonly known as:  novoLOG Inject 0-15 Units into the skin 3 (three) times daily with meals. What changed:  Another medication with the same name was added. Make sure you understand how and when to take each.   insulin aspart 100 UNIT/ML injection Commonly known as:  novoLOG Inject 0-5 Units into the skin at bedtime. What changed:  You were already taking a medication with the same name, and this prescription was added. Make sure you understand how and when to take each.   insulin glargine 100 UNIT/ML injection Commonly known as:  LANTUS Inject 0.3 mLs (30 Units total) into the skin at bedtime. What changed:    how much  to take  when to take this   lisinopril 20 MG tablet Commonly known as:  PRINIVIL,ZESTRIL Take 1 tablet (20 mg total) by mouth daily.   magnesium hydroxide 400 MG/5ML suspension Commonly known as:  MILK OF MAGNESIA Take 30 mLs by mouth daily as needed for mild constipation.   metoprolol succinate 100 MG 24 hr tablet Commonly known as:  TOPROL-XL Take 1 tablet (100 mg total) by mouth daily. Take with or immediately following a meal.   mirtazapine 45 MG tablet Commonly known as:  REMERON Take 1 tablet (45 mg total) by mouth at bedtime.   pantoprazole 40  MG tablet Commonly known as:  PROTONIX Take 1 tablet (40 mg total) by mouth daily.   polyethylene glycol packet Commonly known as:  MIRALAX / GLYCOLAX Take 17 g by mouth daily as needed for mild constipation, moderate constipation or severe constipation.   pregabalin 100 MG capsule Commonly known as:  LYRICA Take 1 capsule (100 mg total) by mouth 3 (three) times daily.   risperiDONE 1 MG tablet Commonly known as:  RISPERDAL Take 1 tablet (1 mg total) at bedtime by mouth.   tamsulosin 0.4 MG Caps capsule Commonly known as:  FLOMAX Take 1 capsule (0.4 mg total) daily by mouth.   traMADol 50 MG tablet Commonly known as:  ULTRAM Take 1 tablet (50 mg total) by mouth every 6 (six) hours as needed for moderate pain or severe pain.   Vitamin D (Ergocalciferol) 50000 units Caps capsule Commonly known as:  DRISDOL Take 1 capsule (50,000 Units total) by mouth every 7 (seven) days.   zolpidem 5 MG tablet Commonly known as:  AMBIEN Take 1 tablet (5 mg total) by mouth at bedtime as needed for sleep.       Disposition and follow-up:   [name] was discharged from [facility] in Stable condition.  At the hospital follow up visit please address:  1.   Multiple Sclerosis -continue rituximab next infusion of 1000mg  in 15 days for maintenance therapy next dose due 02/21/17 can stretch therapy to every 30 days if necessary.  After that follow up with Dr. Epimenio [name], third infusion will be in approx 6 months -make sure to premedicate before rituxan infusion with 125mg  IV solumedrol and 25mg  benadryl, use infusion rate of 50cc/hr -continue vit D supplemenation -continue physical therapy, occupational therapy    Depression with SI -management per inpatient psych  2.  Labs / imaging needed at time of follow-up: bmp  3.  Pending labs/ test needing follow-up: none  Follow-up Appointments: Follow-up Information    [name], Debra Furl, MD Follow up in 1 month(s).     Specialty:  Neurology Why:  follow up with neurology in about 1 month Contact information: [address] [place] [address] [phone]           Hospital Course by problem list: Principal Problem:   Major depressive disorder, single episode, severe without psychosis (HCC) Active Problems:   Vitamin D deficiency   Multiple sclerosis (HCC)   1. Exacerbation of MS Depression with SI Patient was being evaluated by psychiatry at [facility] for depression with suicidal ideation, however began to have worsening lower extremity weakness and worsening bladder/bowel incontinence with concern for exacerbation of her multiple sclerosis.  She got to the point where she could no longer ambulate with a rolling walker as she had done in the past, her vision worsened as well and she was no longer able to read.  She was admitted here at [facility] given several plasmapheresis treatments followed by a 5-day course of 1000 mg Solu-Medrol daily.  She was found to have low vitamin D and was placed on vitamin D replacement, during this time her imaging confirmed aggressive multiple sclerosis with MRI showing new enhancing lesions.  She was eventually placed on rituximab infusion by neurology as her maintenance therapy after being screened carefully for infectious processes such as hepatitis and tuberculosis.  This therapy was recommended for   two initial 1000mg  doses 15 days apart but can be extended to 30 days and still considered adequate per neurology.  This will be followed by a third infusion of 1000mg  around 6months later. This was thought to be the best treatment given how aggressive her MS flare was and concerns with obtaining medication.  During her initial infusion of rituximab she did have some hypotension during the infusion.  The infusion was stopped, with a rate was decreased by 50% down to 50 cc/h and she was given Solu-Medrol 100mg  and Benadryl 25mg  as premedication before restarting the  infusion later that day. she continued to endorse suicidal ideation throughout her stay, received treatment with Remeron and Cymbalta.  Her mood remained stable throughout the admission.  She has had several trials of discontinuing her foley catheter with limited success, please continue to try and discontinue her foley catheter during her stay.     Discharge Vitals:   BP (!) 173/87 (BP Location: Left Arm)   Pulse 70   Temp 98.2 F (36.8 C) (Oral)   Resp 16   Ht 5\' 4"  (1.626 m)   Wt 164 lb 14.5 oz (74.8 kg)   SpO2 99%   BMI 28.31 kg/m   Pertinent Labs, Studies, and Procedures:  Thoracic MRI  IMPRESSION: 1. Regression of demyelinating lesions since September at the C2, C6, and T10 spinal cord levels. 2. However, a new or increased signal abnormality in the cord compatible with interval demyelination at both the C7 and T3 spinal cord levels. No associated spinal cord enhancement or expansion. 3. Mild for age spinal degeneration and mild thoracic epidural lipomatosis are stable. 4. Small pleural effusions have resolved.   Cervical MRI  IMPRESSION: 1. Regression of demyelinating lesions since September at the C2, C6, and T10 spinal cord levels. 2. However, a new or increased signal abnormality in the cord compatible with interval demyelination at both the C7 and T3 spinal cord levels. No associated spinal cord enhancement or expansion. 3. Mild for age spinal degeneration and mild thoracic epidural lipomatosis are stable. 4. Small pleural effusions have resolved.    Status:  Final result  Visible to patient:  No (Not Released)  Next appt:  None   Ref Range & Units 12d ago  Vit D, 25-Hydroxy 30.0 - 100.0 ng/mL 9.5 Abnormally low         QuantiFERON Criteria  Comment   Comment: (NOTE)  The QuantiFERON-TB Gold Plus result is determined by subtracting  the Nil value from either TB antigen (Ag) tube. The mitogen tube  serves as a control for the test.   QuantiFERON TB1 Ag  Value IU/mL 0.04   QuantiFERON TB2 Ag Value IU/mL 0.03   QuantiFERON Nil Value IU/mL 0.03   QuantiFERON Mitogen Value IU/mL >10.00   Comment: (NOTE)  Performed At: [facility]  [address] [place], [place] [id number]  Jolene Schimke MD WU:[id number]   Resulting Agency  [facility] LAB    Specimen Collected: 01/31/17 11:49  Last Resulted: 02/02/17 15:33   Status:  Final result  Visible to patient:  No (Not Released)  Next appt:  None  Component 13d ago  Comment: Comment   Comment: (NOTE)  Non reactive HCV antibody screen is consistent with no HCV infection,  unless recent infection is suspected or other evidence exists to  indicate HCV infection.  Performed At: [facility]  [address] [place], [place] [id number]  Jolene Schimke MD WU:[id number]        Ref Range & Units13d ago HCV Ab0.0 - 0.9 s/co ratio<0.1 Comment: (NOTE)  Performed At: [facility]  [address] [place], [place] [id number]  Jolene Schimke MD VH:[id number]  Resulting Agency   Ref Range & Units 13d ago  Hep B Core Total Ab Negative Negative   Comment: (NOTE)  Performed At: [facility]  [address] [place], [place] [id number]  Jolene Schimke MD UV:[id number]   Resulting Agency  [facility] LAB    Specimen Collected: 01/31/17 11:49  Last Resulted: 02/01/17 06:37   Hep B S AbNon Reactive Comment: (NOTE)              Non Reactive: Inconsistent with immunity,                             less than 10 mIU/mL               Reactive:     Consistent with immunity,                             greater than 9.9 mIU/mL  Performed At: [facility]  [address] [place], [place] [id number]  Jolene Schimke MD VF:[id number]   Discharge Instructions: Discharge Instructions    Ambulatory referral to Neurology   Complete by:  As directed    An appointment is requested in approximately: 2 weeks   Call MD for:   Complete by:  As directed    Call MD for:  difficulty  breathing, headache or visual disturbances   Complete by:  As directed    Call MD for:  extreme fatigue   Complete by:  As directed    Call MD for:  hives   Complete by:  As directed    Call MD for:  persistant dizziness or light-headedness   Complete by:  As directed    Call MD for:  persistant nausea and vomiting   Complete by:  As directed    Call MD for:  redness, tenderness, or signs of infection (pain, swelling, redness, odor or green/yellow discharge around incision site)   Complete by:  As directed    Call MD for:  severe uncontrolled pain   Complete by:  As directed    Call MD for:  temperature >100.4   Complete by:  As directed    Diet - low sodium heart healthy   Complete by:  As directed    Diet - low sodium heart healthy   Complete by:  As directed    Discharge instructions   Complete by:  As directed    Please continue to work with Ms. Azucena Kuba in PT/OT, continue trials of discontinuing her foley catheter.  Have her follow up with Neurology for her MS on discharge and return here for her second infusion as indicated in discharge summary.   Discharge instructions   Complete by:  As  directed    Please continue to work with Ms. Azucena Kubaeid in PT/OT, continue trials of discontinuing her foley catheter.  Have her follow up with Neurology for her MS on discharge and return here for her second infusion as indicated in discharge summary.   Increase activity slowly   Complete by:  As directed    Increase activity slowly   Complete by:  As directed       Signed: Angelita InglesWinfrey, Triston Lisanti B, MD 02/15/2017, 8:00 AM

## 2017-02-07 NOTE — Progress Notes (Signed)
Subjective: No changes clinically.  She tolerated the Rituxan infusion well after slowing the rate last night.  Exam: Vitals:   02/07/17 0443 02/07/17 0950  BP: (!) 148/66 (!) 151/66  Pulse: 69 78  Resp: 14 16  Temp: 98.5 F (36.9 C) 98.7 F (37.1 C)  SpO2: 100%    Gen: In bed, NAD Resp: non-labored breathing, no acute distress Abd: soft, nt  Neuro: MS: awake, alert, interactive and appropriate HC:WCBJS, EOMI Motor: She has mild 4/5 weakness in the left arm, 5/5 right arm, 3/5 RLE, 2/5 LLE Sensory:intact to LT  Impression: 51 yo F with aggressive MS. The plan is for her to get rituximab therapy and I feel that this is reasonable. This will ensure compliance and given how aggressive her course has been I think that this would be a good option. Ocrelizumab would be an option as an outpatient and she could be switched to this in 6 months(next time she would be due for treatment).   Recommendations: 1) Repeat rituxan infusion in 15 days, 1000mg .  I would premedicate with 125 mg Solu-Medrol and 25 mg IV Benadryl. 2) She will need repeat CBC prior to her next infusion 3) Follow up with Dr. Epimenio Foot once discharged from psychiatric hospital.  4) neurology will sign off, please call if further questions or concerns.  Ritta Slot, MD [facility] [phone]  If 7pm- 7am, please page neurology on call as listed in AMION.

## 2017-02-07 NOTE — Progress Notes (Signed)
Inpatient Diabetes Program Recommendations  AACE/ADA: New Consensus Statement on Inpatient Glycemic Control (2015)  Target Ranges:  Prepandial:   less than 140 mg/dL      Peak postprandial:   less than 180 mg/dL (1-2 hours)      Critically ill patients:  140 - 180 mg/dL   Lab Results  Component Value Date   GLUCAP 169 (H) 02/07/2017   HGBA1C 11.4 (H) 12/23/2016    Review of Glycemic ControlResults for Debra [name], Debra [name] (MRN [id number]) as of 02/07/2017 12:58  Ref. Range 02/06/2017 12:10 02/06/2017 16:29 02/06/2017 20:20 02/06/2017 23:53 02/07/2017 04:07 02/07/2017 07:56 02/07/2017 08:26 02/07/2017 08:59 02/07/2017 11:08  Glucose-Capillary Latest Ref Range: 65 - 99 mg/dL 532 (H) 992 (H) 426 (H) 340 (H) 168 (H) 53 (L) 69 142 (H) 169 (H)  Diabetes history:Type 1 DM Outpatient Diabetes medications:Novolog 0-15 units tid with meals, Novolog 8 units tid with meals, Lantus 15 units bid Current orders for Inpatient glycemic control: Lantus 32 units q HS, Novolog 18 units tid with meals, Novolog resistant q 4 hours  Inpatient Diabetes Program Recommendations:  Note that Solumedrol stopped.  Please reduce Novolog correction to sensitive tid with meals (instead of q 4 hours).  Also please reduce Novolog meal coverage to 10 units tid with meals. Sent text page to MD.    Thanks,  Beryl Meager, RN, BC-ADM Inpatient Diabetes Coordinator Pager [phone] (8a-5p)

## 2017-02-07 NOTE — Progress Notes (Signed)
Nutrition Follow-up  DOCUMENTATION CODES:   Not applicable  INTERVENTION:  Premier Protein BID, each supplement provides 160 calories and 30 gm protein D/C Boost Breeze  NUTRITION DIAGNOSIS:   Inadequate oral intake related to acute illness, poor appetite as evidenced by per patient/family report. -ongoing  GOAL:   Patient will meet greater than or equal to 90% of their needs -progressing  MONITOR:   PO intake, I & O's, Labs, Weight trends, Supplement acceptance  ASSESSMENT:   51 y.o. female who was a transfer from [facility] for treatment of acute MS with plasma exchange treatment due to worsening vision that was not improved with Solu-Medrol. She has had improvement of MS symptoms but continues to endorse SI with depressive symptoms that have not improved since treatment at [facility] inpatient psychiatric unit or current hospitalization. She continues to warrant inpatient psychiatric hospitalization once she is medically cleared due to high risk of harm to self.   Now on rituximab for MS per neurology along with 100mg  solumedrol and 25mg  benadryl.  Was hypotensive yesterday after infusion.  Patient has been refusing meals. Did not eat dinner last night or breakfast this morning. States that she received a bagel and grits for breakfast for the past 4 days. According to healthtouch she received a bagel this morning and yesterday. Patient states she has been getting house trays and has not been able to choose her own food. Appears this may have been the case when it snowed and patient services was short staffed. Encouraged her to call down to cafeteria and order food she would like. Complains that boost breeze has been too sweet. Encouraged her to eat as much as she can. Continues on psych precautions.   Meal Completion: 50-100%   Disp: discharge pending inpatient psych placement Wt stable  Labs reviewed:  CBGs 169, 142  Medications reviewed and include: Insulin, Remeron, VItamin  D  Diet Order:  Diet Carb Modified Fluid consistency: Thin; Room service appropriate? Yes  EDUCATION NEEDS:   No education needs have been identified at this time  Skin:  Skin Assessment: Skin Integrity Issues: Skin Integrity Issues:: Other (Comment) Other: MSAD to buttocks  Last BM:  12/10  Height:   Ht Readings from Last 1 Encounters:  01/22/17 5\' 4"  (1.626 m)    Weight:   Wt Readings from Last 1 Encounters:  02/07/17 164 lb 14.5 oz (74.8 kg)    Ideal Body Weight:  54.54 kg  BMI:  Body mass index is 28.31 kg/m.  Estimated Nutritional Needs:   Kcal:  1500-1800kcal/day   Protein:  68-81g/day   Fluid:  >1.5L/day   Dionne Ano. Kaitlyne Friedhoff, MS, RD LDN Inpatient Clinical Dietitian Pager [phone]

## 2017-02-07 NOTE — Progress Notes (Signed)
Physical Therapy Treatment Patient Details Name: Debra [name] MRN: [id number] DOB: May 31, 1965 Today's Date: 02/07/2017    History of Present Illness Pt is a 51 y/o female admitted  from [facility] secondary to MS exacerbation not responding to tx as well as SI and MDD.  Pt transferred to [facility] for insertion of R IJ catheter and plasmophoresis treatments. Pt also very depressed about recent decline in function and psychiatry following. Pt only completed 4/5 PLEX and was discontinued (possible infection acquired). PMH includes MS, HTN, DM with neuropathy, depression, heart murmur, and restless leg; MRI 02/01/17 = Regression of demyelinating lesions since September at the C2,C6, and T10 spinal cord levels.    PT Comments    Pt progress is limited today by symptomatic orthostatic hypotension with upright posture (see vitals in General Comments). Pt is currently minA for bed mobility and modAx2 for sit<>stand [facility] to recliner. Educated nurse that pt needs to be out of bed every day to increase core strength and acclimatize to upright posture so she can more fully participate in therapy sessions. D/c plan remains appropriate. PT will continue to follow acutely.      Follow Up Recommendations  SNF;Other (comment);Supervision/Assistance - 24 hour(vs inpt psych/[facility])           Precautions / Restrictions Precautions Precautions: Fall Precaution Comments: suicide precautions; watch BP Restrictions Weight Bearing Restrictions: No    Mobility  Bed Mobility Overal bed mobility: Needs Assistance Bed Mobility: Supine to Sit     Supine to sit: Min assist     General bed mobility comments: HOB elevated, minA for guiding L UE across to bed rail to aid in pulling to EoB and pushing to upright, Pt required minA for pad scoot of hips to EoB, pt with c/o of dizziness with sitting that resolved with time  Transfers Overall transfer level: Needs assistance Equipment used: 2  person hand held assist;Ambulation equipment used Transfers: Sit to/from Stand Sit to Stand: +2 physical assistance;+2 safety/equipment;Mod assist Stand pivot transfers: (stedy used for safety)       General transfer comment: Pt unable to powerup to standing upright enough to lower Stedy behind her on first attempt, with second attempt able to stand [facility]. Pt encouraged to sit on Stedy long enough for BP measurement and to work on using core strength to maintain upright, once BP collect pt requested to sit down. Pt too fatigued to attempt sit<>stand again   Ambulation/Gait             General Gait Details: pt unable      Balance Overall balance assessment: Needs assistance Sitting-balance support: Single extremity supported;Feet supported Sitting balance-Leahy Scale: Fair Sitting balance - Comments: pt able to maintain fair balance in sitting while BP collected   Standing balance support: Bilateral upper extremity supported Standing balance-Leahy Scale: Zero                              Cognition Arousal/Alertness: Awake/alert Behavior During Therapy: WFL for tasks assessed/performed Overall Cognitive Status: Within Functional Limits for tasks assessed                                 General Comments: pt is agreeeable to treatment and although very weak willing to work          General Comments General  comments (skin integrity, edema, etc.): BP in supine with bed completely flat 135/61, in sitting 106/58, in standing 86/59, after sitting for 3 minutes 107/53      Pertinent Vitals/Pain Pain Assessment: Faces Faces Pain Scale: Hurts a little bit Pain Location: bil LEs Pain Descriptors / Indicators: Tightness Pain Intervention(s): Monitored during session;Limited activity within patient's tolerance;Repositioned           PT Goals (current goals can now be found in the care plan section) Acute Rehab PT  Goals Patient Stated Goal: Get stronger  PT Goal Formulation: With patient Time For Goal Achievement: 02/06/17 Potential to Achieve Goals: Fair Progress towards PT goals: Not progressing toward goals - comment(limited in upright mobility by orthostasis)    Frequency    Min 3X/week      PT Plan Current plan remains appropriate       AM-PAC PT "6 Clicks" Daily Activity  Outcome Measure  Difficulty turning over in bed (including adjusting bedclothes, sheets and blankets)?: A Little Difficulty moving from lying on back to sitting on the side of the bed? : Unable Difficulty sitting down on and standing up from a chair with arms (e.g., wheelchair, bedside commode, etc,.)?: Unable Help needed moving to and from a bed to chair (including a wheelchair)?: Total Help needed walking in hospital room?: Total Help needed climbing 3-5 steps with a railing? : Total 6 Click Score: 8    End of Session Equipment Utilized During Treatment: Gait belt;Other (comment)(stedy) Activity Tolerance: Patient limited by fatigue;Other (comment)(orthostasis) Patient left: in chair;with call bell/phone within reach;with chair alarm set;with nursing/sitter in room Nurse Communication: Mobility status PT Visit Diagnosis: Unsteadiness on feet (R26.81);Muscle weakness (generalized) (M62.81);Other symptoms and signs involving the nervous system (R29.898);Difficulty in walking, not elsewhere classified (R26.2)     Time: 1353-1410 PT Time Calculation (min) (ACUTE ONLY): 17 min  Charges:  $Therapeutic Activity: 8-22 mins                    G Codes:       Marshayla Mitschke B. Beverely Risen PT, DPT Acute Rehabilitation  [phone] Pager [phone]     Debra [name] 02/07/2017, 3:30 PM

## 2017-02-07 NOTE — Progress Notes (Signed)
Pt CBG 53.  Pt breakfast arrived and encouraged to eat and 8 oz orange juice given.  Will continue to monitor.   Sondra Come, RN

## 2017-02-07 NOTE — Progress Notes (Signed)
   Subjective:  Pt was able to tolerate the rest of her rituximab infusion yesterday without any issues after solumedrol, benadryl and slowing rate.  She had a lower blood sugar this morning.  She reports once again skipping her dinner meal.  Additionally, she did not eat her bagel this morning, only had her orange juice.  We discussed in great length how important it was for her to eat regular meals so we can adjust her insulin and that if she continues to skip meals her blood sugar will drop.    Objective:  Vital signs in last 24 hours: Vitals:   02/07/17 0108 02/07/17 0443 02/07/17 0500 02/07/17 0950  BP: (!) 162/74 (!) 148/66  (!) 151/66  Pulse: 72 69  78  Resp: 14 14  16   Temp: 97.8 F (36.6 C) 98.5 F (36.9 C)  98.7 F (37.1 C)  TempSrc: Oral Oral  Oral  SpO2: 100% 100%    Weight:   164 lb 14.5 oz (74.8 kg)   Height:       Constitutional: NAD CV: RRR, 2/6 systolic murmur Resp: CTAB, no increased work of breathing Abd: soft, NDNT MSK, neuro: Sensation intact throughout, UE strength intact, RLE 3/5 strength, LLE 2/5  Assessment/Plan:  Principal Problem:   Major depressive disorder, single episode, severe without psychosis (HCC) Active Problems:   Vitamin D deficiency   Multiple sclerosis (HCC)  MDD with persistent suicidal ideation: Unchanged.  --stable mood today --Continue Cymbalta 60mg  daily, and Remeron 45mg  qhs --Pending placement  MS, potential NMO: This was presenting problem initially. S/p treatment but not on maintenance therapy which is hindering psych placement esp since patient was admitted from psych to [facility] initially due to MS flare.  --Pts MRI 12/5  shows new enhancing lesions.  Given aggressive course of pts MS neurology is choosing Rituximab as pts maintenance therapy, solumedrol high dose course complete. --Vitamin D low, replacing --Liver function normal, ID workup neg quantiferon resulted  02/05/17, now on rituximab --Pt did much better, no  hypotension after continuing her infusion 2 hours later after we gave 100mg  solumedrol and 25mg  benadryl.  Restarted infusion at 50cc/hr half the original rate of 100, completed 02/06/17.    T1DM: --decreased  Lantus to 32 units qhs, --will decrease meal time to Novolog 12 units TID AC, SSI  Moderate correctional --has been a moving target, pt continues to skip some meals, snacks during the night.  HTN:  --on Hydralazine 100mg  TID, metoprolol succinate 100mg  daily, amlodipine 10mg  daily --bp doing well with addition of lisinopril 10mg  daily.   Dispo: Anticipated discharge pending inpatient psychiatry placement.   Angelita Ingles, MD 02/07/2017, 1:22 PM

## 2017-02-08 LAB — GLUCOSE, CAPILLARY
GLUCOSE-CAPILLARY: 105 mg/dL — AB (ref 65–99)
GLUCOSE-CAPILLARY: 131 mg/dL — AB (ref 65–99)
GLUCOSE-CAPILLARY: 240 mg/dL — AB (ref 65–99)
GLUCOSE-CAPILLARY: 276 mg/dL — AB (ref 65–99)
Glucose-Capillary: 205 mg/dL — ABNORMAL HIGH (ref 65–99)
Glucose-Capillary: 75 mg/dL (ref 65–99)

## 2017-02-08 MED ORDER — INSULIN GLARGINE 100 UNIT/ML ~~LOC~~ SOLN
30.0000 [IU] | Freq: Every day | SUBCUTANEOUS | Status: DC
  Administered 2017-02-08: 30 [IU] via SUBCUTANEOUS
  Filled 2017-02-08: qty 0.3

## 2017-02-08 NOTE — Progress Notes (Signed)
   Subjective:  Patient states she feels stable today. She states mood is stable, vision is unchanged, LE weakness is stable.  Objective:  Vital signs in last 24 hours: Vitals:   02/08/17 0100 02/08/17 0516 02/08/17 0600 02/08/17 0930  BP: 133/68 (!) 149/70  (!) 142/72  Pulse: 66 69  70  Resp: 17 16  18   Temp: 98.4 F (36.9 C) 98.6 F (37 C)  98 F (36.7 C)  TempSrc: Oral Axillary  Axillary  SpO2: 100% 98%  99%  Weight:   164 lb 14.5 oz (74.8 kg)   Height:       Constitutional: NAD Resp: no increased work of breathing MSK: UE strength intact, RLE 3/5 strength, LLE 2/5  Assessment/Plan:  Principal Problem:   Major depressive disorder, single episode, severe without psychosis (HCC) Active Problems:   Vitamin D deficiency   Multiple sclerosis (HCC)  MDD with persistent suicidal ideation: Unchanged.  --stable mood today --Continue Cymbalta 60mg  daily, and Remeron 45mg  qhs --Pending placement  MS, potential NMO: This was presenting problem initially. S/p treatment but not on maintenance therapy which is hindering psych placement esp since patient was admitted from psych to [facility] initially due to MS flare. MRI 12/5 obtained due to increased bil LE weakness showed new enhancing lesions; s/p 5 doses of high dose solumedrol. Started on Rituximab (dose 12/11), next dose due 2 wks from then for maintenance therapy then outpt follow up with Dr. Epimenio Foot.  --Vitamin D low, replacing --For further Rituximab 1000mg  dose (~12/26), will need premedication with benadryl 25mg  IV and 125mg  solumedrol IV. Will need to ensure [facility] and SNF placement with be able to provide Rituximab infusion. --medically stable at this time  T1DM: Has been a moving target, pt continues to skip some meals, snacks during the night - appreciate DC recs. --decreased Lantus to 32 units qhs, --meal time Novolog 12 units TID AC, SSI  Moderate correctional  HTN:  --on Hydralazine 100mg  TID, metoprolol succinate 100mg   daily, amlodipine 10mg  daily, lisinopril 10mg  daily   Dispo: Anticipated discharge pending inpatient psychiatry placement.   Nyra Market, MD 02/08/2017, 3:03 PM [phone]

## 2017-02-08 NOTE — Progress Notes (Signed)
OT Cancellation Note  Patient Details Name: Debra [name] MRN: [id number] DOB: 1966/02/28   Cancelled Treatment:    Reason Eval/Treat Not Completed: Fatigue/lethargy limiting ability to participate.  Pt sleeping and unable to adequately rouse for participation.  Will check back.  Indra Wolters Stickney, OTR/L [phone]   [name] 02/08/2017, 2:16 PM

## 2017-02-09 DIAGNOSIS — E10649 Type 1 diabetes mellitus with hypoglycemia without coma: Secondary | ICD-10-CM

## 2017-02-09 DIAGNOSIS — I959 Hypotension, unspecified: Secondary | ICD-10-CM

## 2017-02-09 LAB — GLUCOSE, CAPILLARY
GLUCOSE-CAPILLARY: 95 mg/dL (ref 65–99)
GLUCOSE-CAPILLARY: 220 mg/dL — AB (ref 65–99)
Glucose-Capillary: 208 mg/dL — ABNORMAL HIGH (ref 65–99)
Glucose-Capillary: 224 mg/dL — ABNORMAL HIGH (ref 65–99)
Glucose-Capillary: 230 mg/dL — ABNORMAL HIGH (ref 65–99)
Glucose-Capillary: 247 mg/dL — ABNORMAL HIGH (ref 65–99)
Glucose-Capillary: 265 mg/dL — ABNORMAL HIGH (ref 65–99)

## 2017-02-09 MED ORDER — SODIUM CHLORIDE 0.9 % IV BOLUS (SEPSIS)
500.0000 mL | Freq: Once | INTRAVENOUS | Status: AC
  Administered 2017-02-09: 500 mL via INTRAVENOUS

## 2017-02-09 MED ORDER — INSULIN GLARGINE 100 UNIT/ML ~~LOC~~ SOLN
28.0000 [IU] | Freq: Every day | SUBCUTANEOUS | Status: DC
  Administered 2017-02-09 – 2017-02-11 (×3): 28 [IU] via SUBCUTANEOUS
  Filled 2017-02-09 (×3): qty 0.28

## 2017-02-09 NOTE — Care Management Note (Signed)
Case Management Note  Patient Details  Name: Debra [name] MRN: [id number] Date of Birth: Jul 09, 1965  Subjective/Objective:                    Action/Plan: Plan is for [facility] when bed available. CM following.  Expected Discharge Date:                  Expected Discharge Plan:  IP Rehab Facility  In-House Referral:  Clinical Social Work  Discharge planning Services  CM Consult  Post Acute Care Choice:    Choice offered to:     DME Arranged:    DME Agency:     HH Arranged:    HH Agency:     Status of Service:  In process, will continue to follow  If discussed at Long Length of Stay Meetings, dates discussed:  02/08/2017  Additional Comments:  Kermit Balo, RN 02/09/2017, 3:35 PM

## 2017-02-09 NOTE — Progress Notes (Signed)
Paged that patient was hypotensive and symptomatic.  On evaluation at bedside, patient appears fatigued; she endorses feeling weak, hot, and dizzy. She only ate a couple of bites of her lunch and a small bag of chips due to [employer] not being delivered. Initial low BP was 84/45 and repeat was 108/60.   Exam revealed fatigued appearing female; RRR, no increased work of breathing and CTAB; strength and neuro exam unchanged from this morning though slower to respond.  Plan: --check CBG stat and treat hypoglycemia if present --500cc bolus --holding further hydralazine doses (due for one tonight) and will reassess need/dosage adjustment in morning.  Nyra Market, MD IMTS - PGY2 Pager [phone]

## 2017-02-09 NOTE — Progress Notes (Signed)
Physical Therapy Treatment Patient Details Name: Debra [name] MRN: [id number] DOB: 17-Nov-1965 Today's Date: 02/09/2017    History of Present Illness Pt is a 51 y/o female admitted  from [facility] secondary to MS exacerbation not responding to tx as well as SI and MDD.  Pt transferred to [facility] for insertion of R IJ catheter and plasmophoresis treatments. Pt also very depressed about recent decline in function and psychiatry following. Pt only completed 4/5 PLEX and was discontinued (possible infection acquired). PMH includes MS, HTN, DM with neuropathy, depression, heart murmur, and restless leg; MRI 02/01/17 = Regression of demyelinating lesions since September at the C2,C6, and T10 spinal cord levels.    PT Comments    Pt continues to make progress towards her goals, pt BP is better regulated (see General Comments) and she only had minor dizziness with standing. Pt currently minA for bed mobility and modAx2 for power up to [place]. Pt was able to perform 3x sit<>stand in [place] before fatigue. D/c plan continues to be appropriate. PT will continue to follow pt acutely.   Follow Up Recommendations  SNF;Other (comment);Supervision/Assistance - 24 hour(vs inpt psych/[facility])           Precautions / Restrictions Precautions Precautions: Fall Precaution Comments: suicide precautions; watch BP Restrictions Weight Bearing Restrictions: No    Mobility  Bed Mobility Overal bed mobility: Needs Assistance Bed Mobility: Supine to Sit     Supine to sit: Min assist     General bed mobility comments: minA for assisting trunk to upright and pad scoot of hips to edge of bed  Transfers Overall transfer level: Needs assistance Equipment used: 2 person hand held assist;Ambulation equipment used Transfers: Sit to/from Stand Sit to Stand: +2 physical assistance;+2 safety/equipment;Mod assist Stand pivot transfers: (stedy used for safety)       General transfer comment: Pt able to powerup for  placement of Stedy pads on first attempt and maintain standing for BP measurement. Once pt fatigued she sat down abruptly due to decreased eccentric control in quads and hamstrings. Pt able to sit to stand 2 additional times for 20 seconds each with focus on eccentric control in lowering back into seated  Ambulation/Gait             General Gait Details: pt unable         Balance Overall balance assessment: Needs assistance Sitting-balance support: Single extremity supported;Feet supported Sitting balance-Leahy Scale: Fair Sitting balance - Comments: pt able to maintain fair balance in sitting while BP collected   Standing balance support: Bilateral upper extremity supported Standing balance-Leahy Scale: Zero                              Cognition Arousal/Alertness: Awake/alert Behavior During Therapy: WFL for tasks assessed/performed Overall Cognitive Status: Within Functional Limits for tasks assessed                                 General Comments: pt is agreeeable to treatment and although very weak willing to work       Exercises      General Comments General comments (skin integrity, edema, etc.): pt BP did drop with positional change but pt complained of only minor dizziness BP in supine 174/77, in sitting 137/70, in standing 126/76      Pertinent Vitals/Pain Pain Assessment: Faces Faces Pain Scale: Hurts a little bit  Pain Location: bil LEs Pain Descriptors / Indicators: Tightness Pain Intervention(s): Limited activity within patient's tolerance;Monitored during session;Premedicated before session;Repositioned           PT Goals (current goals can now be found in the care plan section) Acute Rehab PT Goals Patient Stated Goal: Get stronger  PT Goal Formulation: With patient Time For Goal Achievement: 02/06/17 Potential to Achieve Goals: Fair Progress towards PT goals: Progressing toward goals    Frequency    Min  3X/week      PT Plan Current plan remains appropriate    Co-evaluation              AM-PAC PT "6 Clicks" Daily Activity  Outcome Measure  Difficulty turning over in bed (including adjusting bedclothes, sheets and blankets)?: A Little Difficulty moving from lying on back to sitting on the side of the bed? : Unable Difficulty sitting down on and standing up from a chair with arms (e.g., wheelchair, bedside commode, etc,.)?: Unable Help needed moving to and from a bed to chair (including a wheelchair)?: Total Help needed walking in hospital room?: Total Help needed climbing 3-5 steps with a railing? : Total 6 Click Score: 8    End of Session Equipment Utilized During Treatment: Gait belt;Other (comment)(stedy) Activity Tolerance: Patient limited by fatigue;Other (comment)(orthostasis) Patient left: in chair;with call bell/phone within reach;with chair alarm set;with nursing/sitter in room Nurse Communication: Mobility status PT Visit Diagnosis: Unsteadiness on feet (R26.81);Muscle weakness (generalized) (M62.81);Other symptoms and signs involving the nervous system (R29.898);Difficulty in walking, not elsewhere classified (R26.2)     Time: 2992-4268 PT Time Calculation (min) (ACUTE ONLY): 27 min  Charges:  $Therapeutic Activity: 23-37 mins                    G Codes:       Kennen Stammer B. Beverely Risen PT, DPT Acute Rehabilitation  [phone] Pager [phone]     Elon Alas Fleet 02/09/2017, 1:14 PM

## 2017-02-09 NOTE — Progress Notes (Signed)
   Subjective:  Pt says she is doing okay today.  She was able to stand a few times today for about twenty seconds each.  Not short of breath, no chest pain, nausea or vomiting.  Continues to eat only intermittently with spikes and troughs in blood sugars, went over importance of this.    Objective:  Vital signs in last 24 hours: Vitals:   02/09/17 0100 02/09/17 0555 02/09/17 0749 02/09/17 1213  BP: 118/69 (!) 154/81 (!) 169/78 (!) 141/67  Pulse: 73 73 75 78  Resp: 16  15 16   Temp: 98.5 F (36.9 C) 98.4 F (36.9 C) 98.9 F (37.2 C) 98.7 F (37.1 C)  TempSrc: Oral Oral Oral Oral  SpO2: 99% 99% 100% 99%  Weight:      Height:       Constitutional: NAD CV: RRR, 2/6 systolic murmur Resp: CTAB, no increased work of breathing Abd: soft, NDNT MSK, neuro: Sensation intact throughout, UE strength intact, RLE 3/5 strength, LLE 2/5  Assessment/Plan:  Principal Problem:   Major depressive disorder, single episode, severe without psychosis (HCC) Active Problems:   Vitamin D deficiency   Multiple sclerosis (HCC)  MDD with persistent suicidal ideation: Unchanged.  --stable mood today --Continue Cymbalta 60mg  daily, and Remeron 45mg  qhs --Pending placement  MS, potential NMO: This was presenting problem initially. S/p treatment but not on maintenance therapy which is hindering psych placement esp since patient was admitted from psych to [facility] initially due to MS flare.  --Pts MRI 12/5  shows new enhancing lesions.  Given aggressive course of pts MS neurology is choosing Rituximab as pts maintenance therapy, solumedrol high dose course complete. --Vitamin D low, replacing --Liver function normal, ID workup neg quantiferon resulted  02/05/17, now on rituximab --Pt did much better, no hypotension after continuing her infusion 2 hours later after we gave 100mg  solumedrol and 25mg  benadryl.  Restarted infusion at 50cc/hr half the original rate of 100, completed 02/06/17.    T1DM: --novolog 8  units TIDAC, lantus 28 units daily, SSI moderate --has been a moving target, pt continues to skip some meals, snacks during the night.  HTN:  --on Hydralazine 100mg  TID, metoprolol succinate 100mg  daily, amlodipine 10mg  daily --bp doing well with addition of lisinopril 10mg  daily.   Dispo: Anticipated discharge pending inpatient psychiatry placement.   Angelita Ingles, MD 02/09/2017, 3:39 PM

## 2017-02-09 NOTE — Progress Notes (Signed)
CSW following to facilitate discharge plan. CSW contacted Admissions at [facility] and spoke with Coralee North who confirmed that patient remains on the waitlist. CSW confirmed that patient still needs placement.  CSW will continue to follow.  Blenda Nicely, [place] Clinical Social Worker [phone]

## 2017-02-10 LAB — GLUCOSE, CAPILLARY
GLUCOSE-CAPILLARY: 112 mg/dL — AB (ref 65–99)
GLUCOSE-CAPILLARY: 77 mg/dL (ref 65–99)
Glucose-Capillary: 220 mg/dL — ABNORMAL HIGH (ref 65–99)
Glucose-Capillary: 211 mg/dL — ABNORMAL HIGH (ref 65–99)
Glucose-Capillary: 199 mg/dL — ABNORMAL HIGH (ref 65–99)

## 2017-02-10 NOTE — Progress Notes (Signed)
   Subjective:  Patient states she feels better today than last night. She denies further dizziness; denies headache, vision changes, nausea, chest pain, shortness of breath.  Objective:  Vital signs in last 24 hours: Vitals:   02/09/17 2117 02/10/17 0215 02/10/17 0630 02/10/17 1125  BP: 118/62 (!) 157/76 (!) 129/56 (!) 160/70  Pulse: 68 74 72 82  Resp:  16 16 16   Temp: 98.4 F (36.9 C) 98.3 F (36.8 C) 98.7 F (37.1 C) 98.5 F (36.9 C)  TempSrc: Oral Oral Oral Oral  SpO2: 99% 100% 99% 100%  Weight:      Height:       Constitutional: NAD CV: RRR, no murmurs, rubs or gallops Resp: no increased work of breathing, CTAB MSK: UE strength intact, RLE 3/5 strength, LLE 2/5  Assessment/Plan:  Principal Problem:   Major depressive disorder, single episode, severe without psychosis (HCC) Active Problems:   Vitamin D deficiency   Multiple sclerosis (HCC)  MDD with persistent suicidal ideation: Unchanged.  --stable mood today --Continue Cymbalta 60mg  daily, and Remeron 45mg  qhs --Pending placement  MS, potential NMO: This was presenting problem initially. S/p treatment but not on maintenance therapy which is hindering psych placement esp since patient was admitted from psych to [facility] initially due to MS flare. MRI 12/5 obtained due to increased bil LE weakness showed new enhancing lesions; s/p 5 doses of high dose solumedrol. Started on Rituximab (dose 12/11), next dose due 2 wks from then for maintenance therapy then outpt follow up with Dr. Epimenio Foot.  --Vitamin D low, replacing --For further Rituximab 1000mg  dose (~12/26), will need premedication with benadryl 25mg  IV and 125mg  solumedrol IV. Will need to ensure [facility] and SNF placement with be able to provide Rituximab infusion. --medically stable at this time  T1DM: Has been a moving target, pt continues to skip some meals, snacks during the night - appreciate DC recs. --Lantus to 28 units qhs, --meal time Novolog 8 units TID AC, SSI  ac/hs Moderate  HTN:  Stable this morning. --metoprolol succinate 100mg  daily, amlodipine 10mg  daily, lisinopril 10mg  daily  --stopped hydralazine; can add PRN 25mg  hydral if she is having rebound htn due to discontinuation --will get AM Bmet as ACE was restarted about a week ago  Dispo: Anticipated discharge pending inpatient psychiatry placement.   Nyra Market, MD 02/10/2017, 1:31 PM [phone]

## 2017-02-11 LAB — BASIC METABOLIC PANEL
ANION GAP: 8 (ref 5–15)
BUN: 25 mg/dL — ABNORMAL HIGH (ref 6–20)
CALCIUM: 8.6 mg/dL — AB (ref 8.9–10.3)
CO2: 25 mmol/L (ref 22–32)
Chloride: 105 mmol/L (ref 101–111)
Creatinine, Ser: 1.39 mg/dL — ABNORMAL HIGH (ref 0.44–1.00)
GFR, EST AFRICAN AMERICAN: 51 mL/min — AB (ref >60)
GFR, EST NON AFRICAN AMERICAN: 44 mL/min — AB (ref >60)
Glucose, Bld: 240 mg/dL — ABNORMAL HIGH (ref 65–99)
POTASSIUM: 4.3 mmol/L (ref 3.5–5.1)
SODIUM: 138 mmol/L (ref 135–145)

## 2017-02-11 LAB — GLUCOSE, CAPILLARY
GLUCOSE-CAPILLARY: 245 mg/dL — AB (ref 65–99)
GLUCOSE-CAPILLARY: 202 mg/dL — AB (ref 65–99)
GLUCOSE-CAPILLARY: 161 mg/dL — AB (ref 65–99)
GLUCOSE-CAPILLARY: 78 mg/dL (ref 65–99)
Glucose-Capillary: 199 mg/dL — ABNORMAL HIGH (ref 65–99)

## 2017-02-11 NOTE — Plan of Care (Signed)
  Clinical Measurements: Ability to maintain clinical measurements within normal limits will improve 02/11/2017 1922 - Progressing by Luther Redo, RN   Coping: Level of anxiety will decrease 02/11/2017 1922 - Progressing by Luther Redo, RN   Safety: Ability to remain free from injury will improve 02/11/2017 1922 - Progressing by Luther Redo, RN

## 2017-02-11 NOTE — Progress Notes (Signed)
   Subjective:  Patient states she feels good today overall. She denies further dizziness; denies headache, vision changes, nausea, chest pain, shortness of breath.  She feels that her legs may feel somewhat better today.    Objective:  Vital signs in last 24 hours: Vitals:   02/10/17 1125 02/10/17 1650 02/10/17 2200 02/11/17 0601  BP: (!) 160/70 (!) 167/78 (!) 123/59 (!) 164/80  Pulse: 82 78 75 72  Resp: 16 20 20 18   Temp: 98.5 F (36.9 C) 98.4 F (36.9 C) 98.1 F (36.7 C) 98.4 F (36.9 C)  TempSrc: Oral Oral Oral Oral  SpO2: 100% 99% 99% 99%  Weight:      Height:       BMP Latest Ref Rng & Units 02/11/2017 01/14/2017 01/13/2017  Glucose 65 - 99 mg/dL 867(J) 449(E) 010(O)  BUN 6 - 20 mg/dL 71(Q) 16 17  Creatinine 0.44 - 1.00 mg/dL 1.97(J) 8.83(G) 5.49(I)  BUN/Creat Ratio 9 - 23 - - -  Sodium 135 - 145 mmol/L 138 137 136  Potassium 3.5 - 5.1 mmol/L 4.3 4.0 3.9  Chloride 101 - 111 mmol/L 105 110 109  CO2 22 - 32 mmol/L 25 21(L) 20(L)  Calcium 8.9 - 10.3 mg/dL 2.6(E) 1.5(A) 3.0(N)    Constitutional: NAD CV: RRR, no murmurs, rubs or gallops Resp: no increased work of breathing, CTAB MSK: UE strength intact, RLE 3/5 strength, LLE 2/5  Assessment/Plan:  Principal Problem:   Major depressive disorder, single episode, severe without psychosis (HCC) Active Problems:   Vitamin D deficiency   Multiple sclerosis (HCC)  MDD with persistent suicidal ideation: Unchanged.  --stable mood today --Continue Cymbalta 60mg  daily, and Remeron 45mg  qhs --Pending placement  MS, potential NMO: This was presenting problem initially. S/p treatment but not on maintenance therapy which is hindering psych placement esp since patient was admitted from psych to [facility] initially due to MS flare. MRI 12/5 obtained due to increased bil LE weakness showed new enhancing lesions; s/p 5 doses of high dose solumedrol. Started on Rituximab (dose 12/11), next dose due 2 wks from then for maintenance therapy  then outpt follow up with Dr. Epimenio Foot.  --Vitamin D low, replacing --For further Rituximab 1000mg  dose (~12/26), will need premedication with benadryl 25mg  IV and 125mg  solumedrol IV. Will need to ensure [facility] and SNF placement with be able to provide Rituximab infusion. --medically stable at this time  T1DM: Has been a moving target, pt continues to skip some meals, snacks during the night - appreciate DC recs. --Lantus to 28 units qhs, --meal time Novolog 8 units TID AC, SSI ac/hs Moderate  HTN:  Stable this morning. --metoprolol succinate 100mg  daily, amlodipine 10mg  daily, lisinopril 10mg  daily  --stopped hydralazine; can add PRN 25mg  hydral if she is having rebound htn due to discontinuation --AM bmet shows stable potassium and Cr.    Dispo: Anticipated discharge pending inpatient psychiatry placement.   Angelita Ingles, MD 02/11/2017, 9:11 AM [phone]

## 2017-02-12 DIAGNOSIS — R109 Unspecified abdominal pain: Secondary | ICD-10-CM

## 2017-02-12 LAB — GLUCOSE, CAPILLARY
GLUCOSE-CAPILLARY: 262 mg/dL — AB (ref 65–99)
GLUCOSE-CAPILLARY: 236 mg/dL — AB (ref 65–99)
GLUCOSE-CAPILLARY: 175 mg/dL — AB (ref 65–99)
GLUCOSE-CAPILLARY: 160 mg/dL — AB (ref 65–99)
Glucose-Capillary: 149 mg/dL — ABNORMAL HIGH (ref 65–99)

## 2017-02-12 MED ORDER — INSULIN GLARGINE 100 UNIT/ML ~~LOC~~ SOLN
30.0000 [IU] | Freq: Every day | SUBCUTANEOUS | Status: DC
  Administered 2017-02-12 – 2017-02-14 (×3): 30 [IU] via SUBCUTANEOUS
  Filled 2017-02-12 (×3): qty 0.3

## 2017-02-12 MED ORDER — HYDRALAZINE HCL 25 MG PO TABS
25.0000 mg | ORAL_TABLET | Freq: Three times a day (TID) | ORAL | Status: DC | PRN
  Administered 2017-02-12 – 2017-02-13 (×3): 25 mg via ORAL
  Filled 2017-02-12 (×3): qty 1

## 2017-02-12 MED ORDER — BOOST / RESOURCE BREEZE PO LIQD CUSTOM
1.0000 | Freq: Two times a day (BID) | ORAL | Status: DC
  Administered 2017-02-12: 1 via ORAL

## 2017-02-12 MED ORDER — LISINOPRIL 20 MG PO TABS
20.0000 mg | ORAL_TABLET | Freq: Every day | ORAL | Status: DC
  Administered 2017-02-13 – 2017-02-14 (×2): 20 mg via ORAL
  Filled 2017-02-12 (×2): qty 1

## 2017-02-12 NOTE — Consult Note (Addendum)
[facility] Psych Consult Progress Note  02/12/2017 2:56 PM Debra [name]  MRN:  [id number] Subjective:  Debra [name] reports that she is "bored" when asked about her mood. She continues to have SI but is sometimes able to distract herself from these thoughts. She reports that she has been watching television. She denies problems with sleep and reports that her appetite is okay when she has food that she can eat. She reports making progress with PT. She was able to ambulate with a walker. She recently had a Rituximab infusion. She did not tolerate it well. She reports hypotension and nausea while receiving the medication.   Principal Problem: Major depressive disorder, single episode, severe without psychosis ([place]) Diagnosis:   Patient Active Problem List   Diagnosis Date Noted  . Major depressive disorder, single episode, severe without psychosis ([place]) [F32.2] 01/02/2017  . Multiple sclerosis exacerbation ([place]) [G35] 01/01/2017  . Dysthymia [F34.1] 12/27/2016  . Major depressive disorder, recurrent, severe with psychotic features ([place]) [F33.3]   . Multiple joint pain [M25.50]   . HTN (hypertension) [I10]   . Acute pain of left knee [M25.562]   . Neurogenic bladder [N31.9]   . Acute lower UTI [N39.0]   . Labile blood pressure [R09.89]   . Acute blood loss anemia [D62]   . Leukocytosis [D72.829]   . Labile blood glucose [R73.09]   . Hypertensive crisis [I16.9]   . Hypoglycemia [E16.2]   . Poorly controlled type 2 diabetes mellitus with peripheral neuropathy (HCC) [E11.42, E11.65]   . Uncontrolled hypertension [I10]   . Sleep disturbance [G47.9]   . Multiple sclerosis ([place]) [G35] 11/22/2016  . Thoracic root lesion [G54.3] 11/22/2016  . MS (multiple sclerosis) ([place]) [G35]   . Neuropathic pain [M79.2]   . Uncontrolled type 2 diabetes mellitus with peripheral neuropathy (HCC) [E11.42, E11.65]   . Urinary retention [R33.9]   . Medically noncompliant [Z91.19]   . Leg weakness, bilateral  [R29.898] 11/20/2016  . Diabetic ketoacidosis without coma associated with type 1 diabetes mellitus ([place]) [E10.10]   . Leg pain [M79.606]   . History of CVA (cerebrovascular accident) [Z86.73]   . 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 Benign essential HTN [I10]   . Hypertensive urgency [I16.0]   . AKI (acute kidney injury) ([place]) [N17.9]   . Fibromyalgia [M79.7] 11/07/2016  . Back pain [M54.9] 10/31/2016  . Stable angina pectoris ([place]) [I20.8]   . Vitamin D deficiency [E55.9] 08/31/2014  . 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 Hyperlipidemia [E78.5] 10/06/2013  . Depression [F32.9] 10/16/2012  . Bilateral knee pain [M25.561, M25.562] 02/16/2012  . Chronic diarrhea [K52.9] 10/19/2010  . Peripheral neuropathy [G62.9] 08/19/2010  . Type 1 diabetes mellitus ([place]) [E10.9] 07/06/2010  . Hypertension [I10] 07/06/2010  . Asthma [J45.909] 07/06/2010  . Migraine [G43.909] 07/06/2010   Total Time spent with patient: 15 minutes  Past Psychiatric History: Depression  Past Medical History:  Past Medical History:  Diagnosis Date  . Adenomatous colonic polyps   . Anemia    2005  . Anxiety    1990  . Arthritis   . Asthma    2000  . Cataract   . Depression 1990  . Difficult intubation    narrow airway  . Gastroesophageal Reflux Disease (GERD)   . Heart murmur    Birth  . Hyperlipidemia    2005  . Hypertension    1998  . Internal hemorrhoids 04/24/06  on colonoscopy  . Migraine   . Neuropathy of the hands & feet   . Restless Leg Syndrome   . Right Ankle Fracture 10/06/2013  . Sleep paralysis   . Stable angina pectoris    2007: cath showing normal cors.   . Stroke 1990  . Type I Diabetes Mellitus 1988    Past Surgical History:  Procedure Laterality Date  . bilateral foot surgery    . BREAST SURGERY Left    biopsy left breast  . CARDIAC CATHETERIZATION  2007   around  2007 or 2008  . CESAREAN SECTION    . CHOLECYSTECTOMY    . COLONOSCOPY W/ BIOPSIES AND POLYPECTOMY    . DILATION AND CURETTAGE OF UTERUS  12/29/2010   Surgeon: Osborne Oman, MD;  Location: [facility];  Service: Gynecology  . ENDOMETRIAL ABLATION W/ NOVASURE N/A 12/2009  . EYE SURGERY    . HYSTEROSCOPY  12/29/2010   Procedure: HYSTEROSCOPY WITH HYDROTHERMAL ABLATION;  Surgeon: Osborne Oman, MD;  Location: [facility];  Service: Gynecology;;  . IUD REMOVAL  12/29/2010   Procedure: INTRAUTERINE DEVICE (IUD) REMOVAL;  Surgeon: Osborne Oman, MD;  Location: [facility];  Service: Gynecology;  Laterality: N/A;  . LAPAROSCOPIC TUBAL LIGATION  12/29/2010   Procedure: LAPAROSCOPIC TUBAL LIGATION;  Surgeon: Osborne Oman, MD;  Location: [facility];  Service: Gynecology;  Laterality: Bilateral;  . ORIF ANKLE FRACTURE Right 10/09/2013   Procedure: Open reduction internal fixation right ankle;  Surgeon: Nita Sells, MD;  Location: [facility];  Service: Orthopedics;  Laterality: Right;  Open reduction internal fixation right ankle  . TRIGGER FINGER RELEASE     x 3   Family History:  Family History  Problem Relation Age of Onset  . Hypertension Mother   . Kidney disease Mother   . Hypertension Father   . Breast cancer Maternal Grandmother   . Prostate cancer Maternal Grandfather   . Ovarian cancer Paternal Grandmother   . Prostate cancer Paternal Grandfather   . Colon cancer Maternal Uncle        Family history of malignant neoplasm of gastrointestinal tract   Family Psychiatric  History: Denies  Social History:  Social History   Substance and Sexual Activity  Alcohol Use No  . Alcohol/week: 0.0 oz     Social History   Substance and Sexual Activity  Drug Use No   Comment: drug addict    Social History   Socioeconomic History  . Marital status: Divorced    Spouse name: None  . Number of children: 2  . Years of education: 92  . Highest education level: None  Social Needs  .  Financial resource strain: None  . Food insecurity - worry: None  . Food insecurity - inability: None  . Transportation needs - medical: None  . Transportation needs - non-medical: None  Occupational History  . Occupation: Unemployed  Tobacco Use  . Smoking status: Never Smoker  . Smokeless tobacco: Never Used  Substance and Sexual Activity  . Alcohol use: No    Alcohol/week: 0.0 oz  . Drug use: No    Comment: drug addict  . Sexual activity: Yes    Birth control/protection: Other-see comments    Comment: pt states she had ablation in 2011  Other Topics Concern  . None  Social History Narrative   Lost medicaid about 2009 ish when her youngest child turned 31. Has adult children. Lives with boyfriend who financially supports her. Has attempted to  get disability but has been turned down.      2-3 caffeine drinks daily     Sleep: Fair  Appetite:  Good  Current Medications: Current Facility-Administered Medications  Medication Dose Route Frequency Provider Last Rate Last Dose  . acetaminophen (TYLENOL) tablet 650 mg  650 mg Oral Q4H PRN Patrecia Pour, NP   650 mg at 02/09/17 1804  . albuterol (PROVENTIL) (2.5 MG/3ML) 0.083% nebulizer solution 2.5 mg  2.5 mg Inhalation Q4H PRN Patrecia Pour, NP      . alum & mag hydroxide-simeth (MAALOX/MYLANTA) 200-200-20 MG/5ML suspension 30 mL  30 mL Oral Q4H PRN Patrecia Pour, NP      . amLODipine (NORVASC) tablet 10 mg  10 mg Oral Daily Tawny Asal, MD   10 mg at 02/12/17 0753  . aspirin EC tablet 81 mg  81 mg Oral Daily Aldine Contes, MD   81 mg at 02/12/17 0926  . atorvastatin (LIPITOR) tablet 40 mg  40 mg Oral Daily Aldine Contes, MD   40 mg at 02/12/17 0926  . baclofen (LIORESAL) tablet 5 mg  5 mg Oral TID Tawny Asal, MD   5 mg at 02/12/17 0926  . bethanechol (URECHOLINE) tablet 50 mg  50 mg Oral TID Patrecia Pour, NP   50 mg at 02/12/17 2423  . DULoxetine (CYMBALTA) DR capsule 60 mg  60 mg Oral Daily Tawny Asal, MD    60 mg at 02/12/17 0926  . heparin injection 5,000 Units  5,000 Units Subcutaneous Q8H Burgess Estelle, MD   5,000 Units at 02/12/17 1235  . hydrALAZINE (APRESOLINE) tablet 25 mg  25 mg Oral Q8H PRN Katherine Roan, MD      . insulin aspart (novoLOG) injection 0-15 Units  0-15 Units Subcutaneous TID WC Katherine Roan, MD   3 Units at 02/12/17 1234  . insulin aspart (novoLOG) injection 0-5 Units  0-5 Units Subcutaneous QHS Katherine Roan, MD   2 Units at 02/09/17 2308  . insulin aspart (novoLOG) injection 8 Units  8 Units Subcutaneous TID WC Katherine Roan, MD   8 Units at 02/12/17 1235  . insulin glargine (LANTUS) injection 30 Units  30 Units Subcutaneous QHS Katherine Roan, MD      . Derrill Memo ON 02/13/2017] lisinopril (PRINIVIL,ZESTRIL) tablet 20 mg  20 mg Oral Daily Winfrey, William B, MD      . magnesium hydroxide (MILK OF MAGNESIA) suspension 30 mL  30 mL Oral Daily PRN Patrecia Pour, NP   30 mL at 01/29/17 2343  . metoprolol succinate (TOPROL-XL) 24 hr tablet 100 mg  100 mg Oral Daily Tawny Asal, MD   100 mg at 02/12/17 0926  . mirtazapine (REMERON) tablet 45 mg  45 mg Oral QHS Tawny Asal, MD   45 mg at 02/11/17 2127  . ondansetron (ZOFRAN) tablet 4 mg  4 mg Oral Q8H PRN Patrecia Pour, NP   4 mg at 01/05/17 1211  . pantoprazole (PROTONIX) EC tablet 40 mg  40 mg Oral Daily Aldine Contes, MD   40 mg at 02/12/17 0925  . polyethylene glycol (MIRALAX / GLYCOLAX) packet 17 g  17 g Oral Daily PRN Tawny Asal, MD      . pregabalin (LYRICA) capsule 100 mg  100 mg Oral TID Tawny Asal, MD   100 mg at 02/12/17 0925  . protein supplement (PREMIER PROTEIN) liquid  11 oz Oral BID BM Aldine Contes, MD   11 oz at 02/12/17  0930  . sodium chloride flush (NS) 0.9 % injection 10-40 mL  10-40 mL Intracatheter PRN Aldine Contes, MD   10 mL at 02/11/17 2141  . sodium chloride flush (NS) 0.9 % injection 3 mL  3 mL Intravenous Q12H Burgess Estelle, MD   3 mL at 02/12/17 0928   . traMADol (ULTRAM) tablet 50 mg  50 mg Oral Q6H PRN Tawny Asal, MD   50 mg at 02/09/17 1132  . Vitamin D (Ergocalciferol) (DRISDOL) capsule 50,000 Units  50,000 Units Oral Q7 days Katherine Roan, MD   50,000 Units at 02/08/17 1152  . zolpidem (AMBIEN) tablet 5 mg  5 mg Oral QHS PRN Patrecia Pour, NP   5 mg at 02/04/17 0113    Lab Results:  Results for orders placed or performed during the hospital encounter of 01/02/17 (from the past 48 hour(s))  Glucose, capillary     Status: Abnormal   Collection Time: 02/10/17  4:05 PM  Result Value Ref Range   Glucose-Capillary 112 (H) 65 - 99 mg/dL   Comment 1 Notify RN    Comment 2 Document in Chart   Glucose, capillary     Status: None   Collection Time: 02/10/17  9:36 PM  Result Value Ref Range   Glucose-Capillary 77 65 - 99 mg/dL  Glucose, capillary     Status: Abnormal   Collection Time: 02/11/17  4:07 AM  Result Value Ref Range   Glucose-Capillary 245 (H) 65 - 99 mg/dL   Comment 1 Notify RN    Comment 2 Document in Chart   Basic metabolic panel     Status: Abnormal   Collection Time: 02/11/17  4:09 AM  Result Value Ref Range   Sodium 138 135 - 145 mmol/L   Potassium 4.3 3.5 - 5.1 mmol/L   Chloride 105 101 - 111 mmol/L   CO2 25 22 - 32 mmol/L   Glucose, Bld 240 (H) 65 - 99 mg/dL   BUN 25 (H) 6 - 20 mg/dL   Creatinine, Ser 1.39 (H) 0.44 - 1.00 mg/dL   Calcium 8.6 (L) 8.9 - 10.3 mg/dL   GFR calc non Af Amer 44 (L) >60 mL/min   GFR calc Af Amer 51 (L) >60 mL/min    Comment: (NOTE) The eGFR has been calculated using the CKD EPI equation. This calculation has not been validated in all clinical situations. eGFR's persistently <60 mL/min signify possible Chronic Kidney Disease.    Anion gap 8 5 - 15  Glucose, capillary     Status: Abnormal   Collection Time: 02/11/17  6:35 AM  Result Value Ref Range   Glucose-Capillary 202 (H) 65 - 99 mg/dL   Comment 1 Notify RN    Comment 2 Document in Chart   Glucose, capillary      Status: Abnormal   Collection Time: 02/11/17 11:21 AM  Result Value Ref Range   Glucose-Capillary 161 (H) 65 - 99 mg/dL  Glucose, capillary     Status: None   Collection Time: 02/11/17  4:57 PM  Result Value Ref Range   Glucose-Capillary 78 65 - 99 mg/dL  Glucose, capillary     Status: Abnormal   Collection Time: 02/11/17  9:26 PM  Result Value Ref Range   Glucose-Capillary 199 (H) 65 - 99 mg/dL  Glucose, capillary     Status: Abnormal   Collection Time: 02/12/17  5:58 AM  Result Value Ref Range   Glucose-Capillary 262 (H) 65 - 99 mg/dL  Glucose,  capillary     Status: Abnormal   Collection Time: 02/12/17  7:29 AM  Result Value Ref Range   Glucose-Capillary 236 (H) 65 - 99 mg/dL  Glucose, capillary     Status: Abnormal   Collection Time: 02/12/17 11:37 AM  Result Value Ref Range   Glucose-Capillary 175 (H) 65 - 99 mg/dL    Blood Alcohol level:  Lab Results  Component Value Date   ETH <10 12/20/2016   ETH  12/09/2006    <5        LOWEST DETECTABLE LIMIT FOR SERUM ALCOHOL IS 11 mg/dL FOR MEDICAL PURPOSES ONLY    Musculoskeletal: Strength & Muscle Tone:decreased secondary to MS.  Gait & Station:Unable to assess since patient was lying in bed. Patient leans:N/A  Psychiatric Specialty Exam: Physical Exam  Nursing note and vitals reviewed. Constitutional: She is oriented to person, place, and time. She appears well-developed and well-nourished.  HENT:  Head: Normocephalic and atraumatic.  Neck: Normal range of motion.  Respiratory: Effort normal and breath sounds normal.  Musculoskeletal: Normal range of motion.  Neurological: She is alert and oriented to person, place, and time.  Skin: No rash noted.  Psychiatric: Her behavior is normal. Judgment and thought content normal.    Review of Systems  Gastrointestinal: Positive for abdominal pain.  Psychiatric/Behavioral: Positive for depression and suicidal ideas. Negative for hallucinations, memory loss and substance  abuse. The patient is not nervous/anxious and does not have insomnia.   All other systems reviewed and are negative.   Blood pressure (!) 150/71, pulse 79, temperature 98.4 F (36.9 C), temperature source Oral, resp. rate 18, height '5\' 4"'$  (1.626 m), weight 74.8 kg (164 lb 14.5 oz), SpO2 96 %.Body mass index is 28.31 kg/m.  General Appearance: Well Groomed, middle aged, African American female who is lying in bed and wearing a hospital gown and corrective lenses. NAD.   Eye Contact:  Good  Speech:  Clear and Coherent and Normal Rate  Volume:  Normal  Mood:  "Bored"  Affect:  Constricted  Thought Process:  Goal Directed and Linear  Orientation:  Full (Time, Place, and Person)  Thought Content:  Logical  Suicidal Thoughts:  Yes.  without intent/plan  Homicidal Thoughts:  No  Memory:  Immediate;   Good Recent;   Good Remote;   Good  Judgement:  Fair  Insight:  Fair  Psychomotor Activity:  Normal  Concentration:  Concentration: Good and Attention Span: Good  Recall:  Good  Fund of Knowledge:  Good  Language:  Good  Akathisia:  No  Handed:  Right  AIMS (if indicated):   N/A  Assets:  Communication Skills Desire for Improvement  ADL's:  Impaired  Cognition:  WNL  Sleep:   Okay   Assessment: Debra [name] is a 51 y.o. female who was a transfer from [facility] for treatment ofacute MS withplasma exchange treatment due to worsening vision that was not improved with Solu-Medrol.She has had improvement of MS symptoms butcontinues to endorse SI with depressive symptoms that [facility] or [facility].She continues to report SI secondary to medical condition (acute loss of independence). Shecontinues towarrant inpatient psychiatric hospitalization once she is medically cleared due to high risk of harm to self.   Treatment Plan Summary: -Continue bedside sitter due to ongoing SI and risk of harm to self.   -Continue Cymbalta 60 mg daily for depression. -Continue Remeron 45 mgqhsfor sleep, anxiety and depression.  -Patientcontinues torequire inpatient psychiatric hospitalization  following medical clearance given continued SI. -Psychiatry will continue to follow as needed.     Faythe Dingwall, DO 02/12/2017, 2:56 PM

## 2017-02-12 NOTE — Progress Notes (Signed)
This morning BP was high (see epic). Pt was given 10am norvasc and lisinopril early. CBG 236 was cover as ordered. Pt was given with meal novolog.

## 2017-02-12 NOTE — Progress Notes (Signed)
CSW following to facilitate discharge planning. CSW spoke with Chrissie Noa in Admissions at [facility], and patient remains on waitlist. CSW confirmed that patient still needs placement.  CSW will continue to follow.  Blenda Nicely, [place] Clinical Social Worker [phone]

## 2017-02-12 NOTE — Plan of Care (Signed)
  Progressing Education: Knowledge of General Education information will improve 02/12/2017 1043 - Progressing by Quentin Cornwall, RN Health Behavior/Discharge Planning: Ability to manage health-related needs will improve 02/12/2017 1043 - Progressing by Quentin Cornwall, RN Clinical Measurements: Ability to maintain clinical measurements within normal limits will improve 02/12/2017 1043 - Progressing by Quentin Cornwall, RN Will remain free from infection 02/12/2017 1043 - Progressing by Quentin Cornwall, RN Diagnostic test results will improve 02/12/2017 1043 - Progressing by Quentin Cornwall, RN Respiratory complications will improve 02/12/2017 1043 - Progressing by Quentin Cornwall, RN Cardiovascular complication will be avoided 02/12/2017 1043 - Progressing by Quentin Cornwall, RN Activity: Risk for activity intolerance will decrease 02/12/2017 1043 - Progressing by Quentin Cornwall, RN Nutrition: Adequate nutrition will be maintained 02/12/2017 1043 - Progressing by Quentin Cornwall, RN Coping: Level of anxiety will decrease 02/12/2017 1043 - Progressing by Quentin Cornwall, RN Elimination: Will not experience complications related to bowel motility 02/12/2017 1043 - Progressing by Quentin Cornwall, RN Will not experience complications related to urinary retention 02/12/2017 1043 - Progressing by Quentin Cornwall, RN Safety: Ability to remain free from injury will improve 02/12/2017 1043 - Progressing by Quentin Cornwall, RN Skin Integrity: Risk for impaired skin integrity will decrease 02/12/2017 1043 - Progressing by Quentin Cornwall, RN Spiritual Needs Ability to function at adequate level 02/12/2017 1043 - Progressing by Quentin Cornwall, RN Education: Ability to describe self-care measures that may prevent or decrease complications (Diabetes Survival Skills Education) will improve 02/12/2017 1043 - Progressing by Quentin Cornwall, RN Coping: Ability to adjust to condition or  change in health will improve 02/12/2017 1043 - Progressing by Quentin Cornwall, RN Health Behavior/Discharge Planning: Ability to identify and utilize available resources and services will improve 02/12/2017 1043 - Progressing by Quentin Cornwall, RN Ability to manage health-related needs will improve 02/12/2017 1043 - Progressing by Quentin Cornwall, RN Metabolic: Ability to maintain appropriate glucose levels will improve 02/12/2017 1043 - Progressing by Quentin Cornwall, RN Nutritional: Maintenance of adequate nutrition will improve 02/12/2017 1043 - Progressing by Quentin Cornwall, RN Progress toward achieving an optimal weight will improve 02/12/2017 1043 - Progressing by Quentin Cornwall, RN Skin Integrity: Risk for impaired skin integrity will decrease 02/12/2017 1043 - Progressing by Quentin Cornwall, RN

## 2017-02-12 NOTE — Progress Notes (Signed)
Inpatient Diabetes Program Recommendations  AACE/ADA: New Consensus Statement on Inpatient Glycemic Control (2015)  Target Ranges:  Prepandial:   less than 140 mg/dL      Peak postprandial:   less than 180 mg/dL (1-2 hours)      Critically ill patients:  140 - 180 mg/dL   Results for THAINA, LICANO (MRN [id number]) as of 02/12/2017 08:51  Ref. Range 02/11/2017 06:35 02/11/2017 11:21 02/11/2017 16:57 02/11/2017 21:26  Glucose-Capillary Latest Ref Range: 65 - 99 mg/dL 468 (H) 032 (H) 78 122 (H)   Results for DANISE, RIDDLEY (MRN [id number]) as of 02/12/2017 08:51  Ref. Range 02/12/2017 05:58  Glucose-Capillary Latest Ref Range: 65 - 99 mg/dL 488 (H)    Home DM Meds: Lantus 15 units BID       Novolog 0-15 units TID       Novolog 8 units TID  Current Insulin Orders: Lantus 28 units QHS      Novolog Moderate Correction Scale/ SSI (0-15 units) TID AC + HS      Novolog 8 units TID      MD- Note fasting glucose elevated the last two days.  May consider increasing Lantus slightly to 30 units QHS      --Will follow patient during hospitalization--  Ambrose Finland RN, MSN, CDE Diabetes Coordinator Inpatient Glycemic Control Team Team Pager: [phone] (8a-5p)

## 2017-02-12 NOTE — Progress Notes (Signed)
Nutrition Follow-up  DOCUMENTATION CODES:   Not applicable  INTERVENTION:  Continue Premier Protein BID, each supplement provides 160 calories and 30 gm protein  Boost Breeze po BID, each supplement provides 250 kcal and 9 grams of protein  NUTRITION DIAGNOSIS:   Inadequate oral intake related to acute illness, poor appetite as evidenced by per patient/family report. -ongoing  GOAL:   Patient will meet greater than or equal to 90% of their needs -met  MONITOR:   PO intake, I & O's, Labs, Weight trends, Supplement acceptance  REASON FOR ASSESSMENT:   LOS    ASSESSMENT:   51 y.o. female who was a transfer from [facility] for treatment of acute MS with plasma exchange treatment due to worsening vision that was not improved with Solu-Medrol. She has had improvement of MS symptoms but continues to endorse SI with depressive symptoms that have not improved since treatment at [facility] inpatient psychiatric unit or current hospitalization. She continues to warrant inpatient psychiatric hospitalization once she is medically cleared due to high risk of harm to self.   Continues to have MDD with SI Eating better, PO avg 80% Ok with Premier Protein Encouraged her to eat eat well at meals to help with insulin dosing.  Weight stable  Intake/Output Summary (Last 24 hours) at 02/12/2017 1554 Last data filed at 02/12/2017 1230 Gross per 24 hour  Intake 713 ml  Output 2150 ml  Net -1437 ml  17.5L fluid negative  Labs reviewed:  CBGs 175, 236, 262  Medications reviewed and include:  Insulin, Remeron, Vitamin D every 7 days,  Diet Order:  Diet Carb Modified Fluid consistency: Thin; Room service appropriate? Yes  EDUCATION NEEDS:   No education needs have been identified at this time  Skin:  Skin Assessment: Skin Integrity Issues: Skin Integrity Issues:: Other (Comment) Other: MSAD to buttocks  Last BM:  12/16  Height:   Ht Readings from Last 1 Encounters:  01/22/17 '5\' 4"'$  (1.626  m)    Weight:   Wt Readings from Last 1 Encounters:  02/08/17 164 lb 14.5 oz (74.8 kg)    Ideal Body Weight:  54.54 kg  BMI:  Body mass index is 28.31 kg/m.  Estimated Nutritional Needs:   Kcal:  1500-1800kcal/day   Protein:  68-81g/day   Fluid:  >1.5L/day   Satira Anis. Siddiq Kaluzny, MS, RD LDN Inpatient Clinical Dietitian Pager [phone]

## 2017-02-12 NOTE — Progress Notes (Signed)
Physical Therapy Treatment Patient Details Name: Debra [name] MRN: [id number] DOB: 1966-01-28 Today's Date: 02/12/2017    History of Present Illness Pt is a 51 y/o female admitted  from [facility] secondary to MS exacerbation not responding to tx as well as SI and MDD.  Pt transferred to [facility] for insertion of R IJ catheter and plasmophoresis treatments. Pt also very depressed about recent decline in function and psychiatry following. Pt only completed 4/5 PLEX and was discontinued (possible infection acquired). PMH includes MS, HTN, DM with neuropathy, depression, heart murmur, and restless leg; MRI 02/01/17 = Regression of demyelinating lesions since September at the C2,C6, and T10 spinal cord levels.    PT Comments    Pt very frustrated by her slowed progression with mobility. PT explained that last week her orthostasis was a limiting factor. Pt BP is in a range more suitable to therapy today so pt worked on ambulation in session today. Pt is currently min guard for bed mobility, min-modAx2 for transfers and minAx2 for lateral stepping along bed. Pt is limited in her mobility by her generalized deconditioning and the rapid onset of fatigue with movement. In next PT therapy session will work with pt on transfer to W/C and wheelchair mobility to facilitate being able to get out of her hospital room. D/c plans continue to remain appropriate. PT will continue to follow acutely.    Follow Up Recommendations  SNF;Other (comment);Supervision/Assistance - 24 hour(vs inpt psych/[facility])     Equipment Recommendations  Other (comment)(to be determined at next venue)    Recommendations for Other Services       Precautions / Restrictions Precautions Precautions: Fall Precaution Comments: suicide precautions; watch BP Restrictions Weight Bearing Restrictions: No    Mobility  Bed Mobility Overal bed mobility: Needs Assistance Bed Mobility: Supine to Sit     Supine to sit: Min guard     General  bed mobility comments: min guard for safety, pt requires increased time to move LE off bed and to push trunk to upright but able to complete without assist  Transfers Overall transfer level: Needs assistance Equipment used: Ambulation equipment used;Rolling walker (2 wheeled) Transfers: Sit to/from Stand Sit to Stand: +2 physical assistance;+2 safety/equipment;Mod assist;Min assist;From elevated surface Stand pivot transfers: Mod assist;+2 physical assistance(stedy used for safety)       General transfer comment: Pt with increased strength today and able to transfer sit<>stand x2 from bed surface 1x modAx2, 1xminAx2, 2x sit<>stand in [place] with 1x minAx2 and 1x modA x2, pt required modA on final stand to sit for safely lowering into chair as unable to eccentrically control descent  Ambulation/Gait Ambulation/Gait assistance: Min assist;+2 physical assistance Ambulation Distance (Feet): 5 Feet(1x2', 1x3') Assistive device: Rolling walker (2 wheeled) Gait Pattern/deviations: Decreased step length - left;Trunk flexed(L lateral stepping ) Gait velocity: slowed Gait velocity interpretation: Below normal speed for age/gender General Gait Details: 2 attempts at L lateral stepping toward HoB, minAx2 for steadying, vc and tc  for sequencing, increased ability to move R foot       Balance Overall balance assessment: Needs assistance Sitting-balance support: Feet supported;No upper extremity supported Sitting balance-Leahy Scale: Fair     Standing balance support: Bilateral upper extremity supported Standing balance-Leahy Scale: Poor Standing balance comment: requires RW or Stedy assist for maintaining standing balance                            Cognition Arousal/Alertness: Awake/alert Behavior During  Therapy: WFL for tasks assessed/performed Overall Cognitive Status: Within Functional Limits for tasks assessed                                 General Comments:  pt frustrated with her lack of progression         General Comments General comments (skin integrity, edema, etc.): Bp in supine 169/84, after stepping towards HoB 120/65 (with minor c/o of dizziness), BP 145/75 after transferring to chair      Pertinent Vitals/Pain Pain Assessment: 0-10 Pain Score: 6  Pain Location: bil LEs Pain Descriptors / Indicators: Tightness;Aching;Burning Pain Intervention(s): Limited activity within patient's tolerance;Monitored during session;Repositioned           PT Goals (current goals can now be found in the care plan section) Acute Rehab PT Goals Patient Stated Goal: Get stronger  PT Goal Formulation: With patient Time For Goal Achievement: 02/06/17 Potential to Achieve Goals: Fair Progress towards PT goals: Progressing toward goals    Frequency    Min 3X/week      PT Plan Current plan remains appropriate       AM-PAC PT "6 Clicks" Daily Activity  Outcome Measure  Difficulty turning over in bed (including adjusting bedclothes, sheets and blankets)?: A Little Difficulty moving from lying on back to sitting on the side of the bed? : A Lot Difficulty sitting down on and standing up from a chair with arms (e.g., wheelchair, bedside commode, etc,.)?: Unable Help needed moving to and from a bed to chair (including a wheelchair)?: Total Help needed walking in hospital room?: Total Help needed climbing 3-5 steps with a railing? : Total 6 Click Score: 9    End of Session Equipment Utilized During Treatment: Gait belt;Other (comment)(stedy) Activity Tolerance: Patient limited by fatigue Patient left: in chair;with call bell/phone within reach;with chair alarm set;with nursing/sitter in room Nurse Communication: Mobility status PT Visit Diagnosis: Unsteadiness on feet (R26.81);Muscle weakness (generalized) (M62.81);Other symptoms and signs involving the nervous system (R29.898);Difficulty in walking, not elsewhere classified (R26.2)      Time: 0932-6712 PT Time Calculation (min) (ACUTE ONLY): 32 min  Charges:  $Gait Training: 8-22 mins $Therapeutic Activity: 8-22 mins                    G Codes:       Reka Wist B. Beverely Risen PT, DPT Acute Rehabilitation  [phone] Pager [phone]     Elon Alas Fleet 02/12/2017, 11:55 AM

## 2017-02-12 NOTE — Progress Notes (Signed)
   Subjective:  Patient states she feels okay today. She denies further dizziness; denies headache, vision changes, nausea, chest pain, shortness of breath.  She feels that her legs are hurting some, but she did just work with PT today and says it went well.    Objective:  Vital signs in last 24 hours: Vitals:   02/12/17 0729 02/12/17 0730 02/12/17 0931 02/12/17 1030  BP: (!) 195/88 (!) 191/82 (!) 183/83 (!) 147/75  Pulse:      Resp:      Temp:      TempSrc:      SpO2:      Weight:      Height:       BMP Latest Ref Rng & Units 02/11/2017 01/14/2017 01/13/2017  Glucose 65 - 99 mg/dL 349(Z) 791(T) 056(P)  BUN 6 - 20 mg/dL 79(Y) 16 17  Creatinine 0.44 - 1.00 mg/dL 8.01(K) 5.53(Z) 4.82(L)  BUN/Creat Ratio 9 - 23 - - -  Sodium 135 - 145 mmol/L 138 137 136  Potassium 3.5 - 5.1 mmol/L 4.3 4.0 3.9  Chloride 101 - 111 mmol/L 105 110 109  CO2 22 - 32 mmol/L 25 21(L) 20(L)  Calcium 8.9 - 10.3 mg/dL 0.7(E) 6.7(J) 4.4(B)    Constitutional: NAD CV: RRR, no murmurs, rubs or gallops Resp: no increased work of breathing, CTAB MSK: UE strength intact, RLE 3/5 strength, LLE 2/5  Assessment/Plan:  Principal Problem:   Major depressive disorder, single episode, severe without psychosis (HCC) Active Problems:   Vitamin D deficiency   Multiple sclerosis (HCC)  MDD with persistent suicidal ideation: Unchanged.  --stable mood today --Continue Cymbalta 60mg  daily, and Remeron 45mg  qhs --Pending placement  MS, potential NMO: This was presenting problem initially. S/p treatment but not on maintenance therapy which is hindering psych placement esp since patient was admitted from psych to [facility] initially due to MS flare. MRI 12/5 obtained due to increased bil LE weakness showed new enhancing lesions; s/p 5 doses of high dose solumedrol. Started on Rituximab (dose 12/11), next dose due 2 wks from then for maintenance therapy then outpt follow up with Dr. Epimenio Foot.  --Vitamin D low, replacing --For  further Rituximab 1000mg  dose (~12/26), will need premedication with benadryl 25mg  IV and 125mg  solumedrol IV. Will need to ensure [facility] and SNF placement with be able to provide Rituximab infusion. --medically stable at this time  T1DM: Has been a moving target, pt continues to skip some meals, snacks during the night - appreciate DC recs. --Lantus increased to 30 units qhs, --meal time Novolog 8 units TID AC, SSI ac/hs Moderate   HTN:  --metoprolol succinate 100mg  daily, amlodipine 10mg  daily, increased lisinopril to 20mg  daily  --stopped hydralazine; added PRN 25mg  hydral if she is having rebound htn due to discontinuation --AM bmet shows stable potassium and Cr.    Dispo: Anticipated discharge pending inpatient psychiatry placement.   Angelita Ingles, MD 02/12/2017, 1:44 PM [phone]

## 2017-02-13 LAB — GLUCOSE, CAPILLARY
GLUCOSE-CAPILLARY: 224 mg/dL — AB (ref 65–99)
GLUCOSE-CAPILLARY: 73 mg/dL (ref 65–99)
GLUCOSE-CAPILLARY: 142 mg/dL — AB (ref 65–99)
Glucose-Capillary: 166 mg/dL — ABNORMAL HIGH (ref 65–99)

## 2017-02-13 MED ORDER — GLUCERNA SHAKE PO LIQD
237.0000 mL | Freq: Two times a day (BID) | ORAL | Status: DC
  Administered 2017-02-13: 237 mL via ORAL

## 2017-02-13 NOTE — Progress Notes (Signed)
   02/13/17 1000  Clinical Encounter Type  Visited With Patient  Visit Type Social support  Consult/Referral To Chaplain  Spiritual Encounters  Spiritual Needs Emotional  Chaplain sat with PT to discuss her faith and to check in with the PT about her mental status.  PT requests continued visits from the [facility]

## 2017-02-13 NOTE — Progress Notes (Signed)
Occupational Therapy Treatment Patient Details Name: Semeka Ellestad MRN: [id number] DOB: [birth year] Today's Date: 02/13/2017    History of present illness Pt is a 51 y/o female admitted  from [facility] secondary to MS exacerbation not responding to tx as well as SI and MDD.  Pt transferred to [facility] for insertion of R IJ catheter and plasmophoresis treatments. Pt also very depressed about recent decline in function and psychiatry following. Pt only completed 4/5 PLEX and was discontinued (possible infection acquired). PMH includes MS, HTN, DM with neuropathy, depression, heart murmur, and restless leg; MRI 02/01/17 = Regression of demyelinating lesions since September at the C2,C6, and T10 spinal cord levels.   OT comments  Pt seen for OT ADL retraining session today with focus on bed mobility, sitting EOB during grooming and UB bathing as well as trunk control/balance during functional activity. Pt tolerated ADL's as stated at Min A level for ~18 w/o LOB, but cont to be limited by c/o fatigue and dizziness in sitting. Sitter in room throughout session.   Follow Up Recommendations  SNF;Supervision/Assistance - 24 hour    Equipment Recommendations  None recommended by OT    Recommendations for Other Services Rehab consult    Precautions / Restrictions Precautions Precautions: Fall Precaution Comments: suicide precautions; watch BP Restrictions Weight Bearing Restrictions: No       Mobility Bed Mobility Overal bed mobility: Needs Assistance Bed Mobility: Supine to Sit;Sit to Supine     Supine to sit: Min guard Sit to supine: Min assist   General bed mobility comments: min guard for safety, pt requires increased time to move LE off bed and to push trunk to upright but able to complete without assist. Min A for LE's when getting back into bed however  Transfers                      Balance Overall balance assessment: Needs assistance Sitting-balance support: Feet  supported;No upper extremity supported Sitting balance-Leahy Scale: Fair Sitting balance - Comments: pt able to maintain fair balance in sitting EOB during ADL's this morning                                   ADL either performed or assessed with clinical judgement   ADL Overall ADL's : Needs assistance/impaired     Grooming: Wash/dry hands;Wash/dry face;Sitting;Min guard Grooming Details (indicate cue type and reason): Sitting up EOB with set-up, increased time and min guard assist Upper Body Bathing: Sitting;Min guard Upper Body Bathing Details (indicate cue type and reason): Sitting EOB Min guard assist. Max A to wash back in sitting at EOB                           General ADL Comments: Pt agreeable to OT retraining session today with focus on bed mobility, sitting EOB/balance, grooming and UB bathing in preparation for increased participation with ADl's and self care tasks. Supervision-Min guard assist for bed mobility noted, pt Mod I trunk control this date. Pt sitting EOB for ~18 min during ADL's today. Min A LE's when getting back into bed noted.     Vision Baseline Vision/History: Wears glasses Wears Glasses: At all times     Perception     Praxis      Cognition Arousal/Alertness: Awake/alert Behavior During Therapy: [facility] for tasks assessed/performed;Flat affect Overall Cognitive Status: Within Functional  Limits for tasks assessed                                 General Comments: pt frustrated with her lack of progression        Exercises     Shoulder Instructions       General Comments      Pertinent Vitals/ Pain       Pain Assessment: 0-10 Pain Score: 5  Pain Location: bil LEs Pain Descriptors / Indicators: Tightness;Aching;Burning Pain Intervention(s): Limited activity within patient's tolerance;Repositioned;Monitored during session  Home Living                                          Prior  Functioning/Environment              Frequency           Progress Toward Goals  OT Goals(current goals can now be found in the care plan section)  Progress towards OT goals: Progressing toward goals  Acute Rehab OT Goals Patient Stated Goal: Get stronger   Plan Discharge plan remains appropriate    Co-evaluation                 AM-PAC PT "6 Clicks" Daily Activity     Outcome Measure   Help from another person eating meals?: A Little Help from another person taking care of personal grooming?: A Little Help from another person toileting, which includes using toliet, bedpan, or urinal?: A Lot Help from another person bathing (including washing, rinsing, drying)?: A Lot Help from another person to put on and taking off regular upper body clothing?: A Lot Help from another person to put on and taking off regular lower body clothing?: Total 6 Click Score: 13    End of Session    OT Visit Diagnosis: Other abnormalities of gait and mobility (R26.89);Muscle weakness (generalized) (M62.81);Low vision, both eyes (H54.2)   Activity Tolerance Patient tolerated treatment well   Patient Left in bed;with call bell/phone within reach;with nursing/sitter in room   Nurse Communication          Time: 1032-1050 OT Time Calculation (min): 18 min  Charges: OT General Charges $OT Visit: 1 Visit OT Treatments $Self Care/Home Management : 8-22 mins    Renaud Celli Beth Dixon, OTR/L 02/13/2017, 11:05 AM

## 2017-02-13 NOTE — Progress Notes (Signed)
   Subjective:  Patient states she feels terrible today because she is very bored. She denies somatic complaints like SOB, chest pain. Continues to try and work with PT daily, has not lost motiviation.   .   Objective:  Vital signs in last 24 hours: Vitals:   02/13/17 0200 02/13/17 0346 02/13/17 0914 02/13/17 1414  BP: (!) 168/87 (!) 166/79 (!) 153/83 (!) 164/83  Pulse:  71 73 71  Resp: 18 18 19    Temp:  98.4 F (36.9 C) 98.4 F (36.9 C) 98 F (36.7 C)  TempSrc:  Axillary Oral Oral  SpO2:  98% 100% 98%  Weight:      Height:       BMP Latest Ref Rng & Units 02/11/2017 01/14/2017 01/13/2017  Glucose 65 - 99 mg/dL 920(F) 007(H) 219(X)  BUN 6 - 20 mg/dL 58(I) 16 17  Creatinine 0.44 - 1.00 mg/dL 3.25(Q) 9.82(M) 4.15(A)  BUN/Creat Ratio 9 - 23 - - -  Sodium 135 - 145 mmol/L 138 137 136  Potassium 3.5 - 5.1 mmol/L 4.3 4.0 3.9  Chloride 101 - 111 mmol/L 105 110 109  CO2 22 - 32 mmol/L 25 21(L) 20(L)  Calcium 8.9 - 10.3 mg/dL 3.0(N) 4.0(H) 6.8(G)    Constitutional: NAD CV: RRR, no murmurs, rubs or gallops Resp: no increased work of breathing, CTAB MSK: UE strength intact, RLE 3/5 strength, LLE 2/5  Assessment/Plan:  Principal Problem:   Major depressive disorder, single episode, severe without psychosis (HCC) Active Problems:   Vitamin D deficiency   Multiple sclerosis (HCC)  MDD with persistent suicidal ideation: Unchanged.  --stable mood today --Continue Cymbalta 60mg  daily, and Remeron 45mg  qhs --Pending placement  MS, potential NMO: This was presenting problem initially. S/p treatment but not on maintenance therapy which is hindering psych placement esp since patient was admitted from psych to [facility] initially due to MS flare. MRI 12/5 obtained due to increased bil LE weakness showed new enhancing lesions; s/p 5 doses of high dose solumedrol. Started on Rituximab (dose 12/11), next dose due 2 wks from then for maintenance therapy then outpt follow up with Dr. Epimenio Foot.    --Vitamin D low, replacing --For further Rituximab 1000mg  dose (~12/26), will need premedication with benadryl 25mg  IV and 125mg  solumedrol IV. Will need to ensure [facility] and SNF placement with be able to provide Rituximab infusion. --medically stable at this time  T1DM: Has been a moving target, pt continues to skip some meals, snacks during the night - appreciate DC recs. --Lantus increased to 30 units qhs, --meal time Novolog 8 units TID AC, SSI ac/hs Moderate   HTN:  --better controlled today --metoprolol succinate 100mg  daily, amlodipine 10mg  daily, increased lisinopril to 20mg  daily  --stopped hydralazine; added PRN 25mg  hydral if she is having rebound htn due to discontinuation --bmet shows stable potassium and Cr.    Dispo: medically stable with [facility].   Angelita Ingles, MD 02/13/2017, 2:54 PM [phone]

## 2017-02-14 DIAGNOSIS — R531 Weakness: Secondary | ICD-10-CM

## 2017-02-14 LAB — GLUCOSE, CAPILLARY
GLUCOSE-CAPILLARY: 159 mg/dL — AB (ref 65–99)
GLUCOSE-CAPILLARY: 140 mg/dL — AB (ref 65–99)
Glucose-Capillary: 216 mg/dL — ABNORMAL HIGH (ref 65–99)
Glucose-Capillary: 89 mg/dL (ref 65–99)

## 2017-02-14 MED ORDER — HYDRALAZINE HCL 25 MG PO TABS: 25.0000 mg | ORAL_TABLET | Freq: Three times a day (TID) | ORAL | Status: DC | PRN

## 2017-02-14 MED ORDER — MAGNESIUM HYDROXIDE 400 MG/5ML PO SUSP: 30.0000 mL | Freq: Every day | ORAL | 0 refills | Status: DC | PRN

## 2017-02-14 MED ORDER — AMLODIPINE BESYLATE 10 MG PO TABS: 10.0000 mg | ORAL_TABLET | Freq: Every day | ORAL | 1 refills | Status: DC

## 2017-02-14 MED ORDER — BACLOFEN 5 MG PO TABS: 5.0000 mg | ORAL_TABLET | Freq: Three times a day (TID) | ORAL | 0 refills | Status: DC

## 2017-02-14 MED ORDER — METOPROLOL SUCCINATE ER 100 MG PO TB24: 100.0000 mg | ORAL_TABLET | Freq: Every day | ORAL | 0 refills | Status: DC

## 2017-02-14 MED ORDER — GLUCERNA SHAKE PO LIQD: 237.0000 mL | Freq: Two times a day (BID) | ORAL | 0 refills | Status: DC

## 2017-02-14 MED ORDER — VITAMIN D (ERGOCALCIFEROL) 1.25 MG (50000 UNIT) PO CAPS: 50000.0000 [IU] | ORAL_CAPSULE | ORAL | Status: DC

## 2017-02-14 MED ORDER — ZOLPIDEM TARTRATE 5 MG PO TABS: 5.0000 mg | ORAL_TABLET | Freq: Every evening | ORAL | 0 refills | Status: DC | PRN

## 2017-02-14 MED ORDER — MIRTAZAPINE 45 MG PO TABS: 45.0000 mg | ORAL_TABLET | Freq: Every day | ORAL | Status: DC

## 2017-02-14 MED ORDER — TRAMADOL HCL 50 MG PO TABS: 50.0000 mg | ORAL_TABLET | Freq: Four times a day (QID) | ORAL | Status: DC | PRN

## 2017-02-14 MED ORDER — INSULIN ASPART 100 UNIT/ML ~~LOC~~ SOLN: 0.0000 [IU] | Freq: Three times a day (TID) | SUBCUTANEOUS | 11 refills | Status: DC

## 2017-02-14 MED ORDER — ASPIRIN EC 81 MG PO TBEC: 81.0000 mg | DELAYED_RELEASE_TABLET | Freq: Every day | ORAL | 3 refills | Status: AC

## 2017-02-14 MED ORDER — PANTOPRAZOLE SODIUM 40 MG PO TBEC: 40.0000 mg | DELAYED_RELEASE_TABLET | Freq: Every day | ORAL | Status: DC

## 2017-02-14 MED ORDER — INSULIN ASPART 100 UNIT/ML ~~LOC~~ SOLN: 0.0000 [IU] | Freq: Every day | SUBCUTANEOUS | 11 refills | Status: DC

## 2017-02-14 MED ORDER — POLYETHYLENE GLYCOL 3350 17 G PO PACK: 17.0000 g | PACK | Freq: Every day | ORAL | 0 refills | Status: DC | PRN

## 2017-02-14 MED ORDER — PREGABALIN 100 MG PO CAPS: 100.0000 mg | ORAL_CAPSULE | Freq: Three times a day (TID) | ORAL | Status: DC

## 2017-02-14 MED ORDER — ATORVASTATIN CALCIUM 40 MG PO TABS: 40.0000 mg | ORAL_TABLET | Freq: Every day | ORAL | 3 refills | Status: DC

## 2017-02-14 MED ORDER — BETHANECHOL CHLORIDE 50 MG PO TABS
50.0000 mg | ORAL_TABLET | Freq: Three times a day (TID) | ORAL | Status: DC
Start: 2017-02-14 — End: 2017-09-07

## 2017-02-14 MED ORDER — INSULIN GLARGINE 100 UNIT/ML ~~LOC~~ SOLN: 30.0000 [IU] | Freq: Every day | SUBCUTANEOUS | 11 refills | Status: DC

## 2017-02-14 MED ORDER — LISINOPRIL 20 MG PO TABS: 20.0000 mg | ORAL_TABLET | Freq: Every day | ORAL | Status: DC

## 2017-02-14 NOTE — Progress Notes (Signed)
CSW working to facilitate discharge planning. CSW coordinating who will pay for the ambulance transport; sheriff's office is saying it should be the hospital's responsibility since the patient doesn't have insurance. CSW contacted CSW Director, who indicated that it should be the sheriff's office as the patient is under IVC for transportation for psychiatric treatment. CSW contacted [facility] billing department, who also indicated that it should be the sheriff's department. CSW contacted [place], who indicated that it wasn't their responsibility and it would be on the patient.   Sheriff later called back and said that the legal team was looking into it and would get back on decision. CSW will continue to follow.  Blenda Nicely, [place] Clinical Social Worker [phone]

## 2017-02-14 NOTE — Progress Notes (Signed)
Physical Therapy Treatment Patient Details Name: Debra [name] MRN: [id number] DOB: 30-Jun-1965 Today's Date: 02/14/2017    History of Present Illness Pt is a 51 y/o female admitted  from [facility] secondary to MS exacerbation not responding to tx as well as SI and MDD.  Pt transferred to [facility] for insertion of R IJ catheter and plasmophoresis treatments. Pt also very depressed about recent decline in function and psychiatry following. Pt only completed 4/5 PLEX and was discontinued (possible infection acquired). PMH includes MS, HTN, DM with neuropathy, depression, heart murmur, and restless leg; MRI 02/01/17 = Regression of demyelinating lesions since September at the C2,C6, and T10 spinal cord levels.    PT Comments    Pt scheduled to d/c tomorrow and focus of session to work on transfer to w/c for [employer] transfer. Pt able to come from supine to sit with min guard assist. While seated EoB pt c/o increased dizziness, nausea and feeling very hot, BP 119/68. Pt request to lay back down. Given pt symptomology do not feel pt could tolerate transfer in w/c.      Follow Up Recommendations  SNF;Other (comment);Supervision/Assistance - 24 hour(vs inpt psych/[facility])     Equipment Recommendations  Other (comment)(to be determined at next venue)    Recommendations for Other Services       Precautions / Restrictions Precautions Precautions: Fall Precaution Comments: suicide precautions; watch BP Restrictions Weight Bearing Restrictions: No    Mobility  Bed Mobility Overal bed mobility: Needs Assistance Bed Mobility: Supine to Sit     Supine to sit: Min guard     General bed mobility comments: min guard for safety, pt requires increased time to move LE off bed and to push trunk to upright but able to complete without assist  Transfers                 General transfer comment: unable to attempt due to dizziness and nausea      Balance Overall balance assessment: Needs  assistance Sitting-balance support: Feet supported;No upper extremity supported Sitting balance-Leahy Scale: Fair                                      Cognition Arousal/Alertness: Awake/alert Behavior During Therapy: WFL for tasks assessed/performed Overall Cognitive Status: Within Functional Limits for tasks assessed                                 General Comments: pt is looking forward to leaving tomorrow       Pertinent Vitals/Pain Pain Assessment: No/denies pain           PT Goals (current goals can now be found in the care plan section) Acute Rehab PT Goals Patient Stated Goal: Get stronger  PT Goal Formulation: With patient Time For Goal Achievement: 02/06/17 Potential to Achieve Goals: Fair    Frequency    Min 3X/week      PT Plan Current plan remains appropriate       AM-PAC PT "6 Clicks" Daily Activity  Outcome Measure  Difficulty turning over in bed (including adjusting bedclothes, sheets and blankets)?: A Little Difficulty moving from lying on back to sitting on the side of the bed? : A Lot Difficulty sitting down on and standing up from a chair with arms (e.g., wheelchair, bedside commode, etc,.)?: Unable Help needed moving to  and from a bed to chair (including a wheelchair)?: Total Help needed walking in hospital room?: Total Help needed climbing 3-5 steps with a railing? : Total 6 Click Score: 9    End of Session   Activity Tolerance: Patient limited by fatigue Patient left: in chair;with call bell/phone within reach;with chair alarm set;with nursing/sitter in room Nurse Communication: Mobility status PT Visit Diagnosis: Unsteadiness on feet (R26.81);Muscle weakness (generalized) (M62.81);Other symptoms and signs involving the nervous system (R29.898);Difficulty in walking, not elsewhere classified (R26.2)     Time: 1442-1500 PT Time Calculation (min) (ACUTE ONLY): 18 min  Charges:  $Therapeutic Activity: 8-22  mins                    G Codes:       Amardeep Beckers B. Beverely Risen PT, DPT Acute Rehabilitation  [phone] Pager [phone]     Elon Alas Fleet 02/14/2017, 3:40 PM

## 2017-02-14 NOTE — Care Management Note (Signed)
Case Management Note  Patient Details  Name: Debra [name] MRN: [id number] Date of Birth: [birth year]  Subjective/Objective:                    Action/Plan: Pt discharging to [facility] today. No further needs per CM.  Expected Discharge Date:  02/14/17               Expected Discharge Plan:  IP Rehab Facility  In-House Referral:  Clinical Social Work  Discharge planning Services  CM Consult  Post Acute Care Choice:    Choice offered to:     DME Arranged:    DME Agency:     HH Arranged:    HH Agency:     Status of Service:  Completed, signed off  If discussed at [employer], dates discussed:    Additional Comments:  Kermit Balo, RN 02/14/2017, 10:29 AM

## 2017-02-14 NOTE — Progress Notes (Signed)
   Subjective:  Pt reports feeling alright today.  [facility]  She reports her leg weakness is about the same, is still working hard with PT.   .   Objective:  Vital signs in last 24 hours: Vitals:   02/13/17 1746 02/13/17 2200 02/14/17 0215 02/14/17 0500  BP: (!) 155/79 (!) 151/76 (!) 150/79 (!) 154/84  Pulse: 76 74 70 72  Resp: 18 16 14 16   Temp: 98.5 F (36.9 C) 98.3 F (36.8 C) 97.6 F (36.4 C) 99 F (37.2 C)  TempSrc: Oral Oral Axillary Oral  SpO2: 99% 99% 100% 100%  Weight:      Height:       BMP Latest Ref Rng & Units 02/11/2017 01/14/2017 01/13/2017  Glucose 65 - 99 mg/dL 662(H) 476(L) 465(K)  BUN 6 - 20 mg/dL 35(W) 16 17  Creatinine 0.44 - 1.00 mg/dL 6.56(C) 1.27(N) 1.70(Y)  BUN/Creat Ratio 9 - 23 - - -  Sodium 135 - 145 mmol/L 138 137 136  Potassium 3.5 - 5.1 mmol/L 4.3 4.0 3.9  Chloride 101 - 111 mmol/L 105 110 109  CO2 22 - 32 mmol/L 25 21(L) 20(L)  Calcium 8.9 - 10.3 mg/dL 1.7(C) 9.4(W) 9.6(P)    Constitutional: NAD CV: RRR, no murmurs, rubs or gallops Resp: no increased work of breathing, CTAB MSK: UE strength intact, RLE 3/5 strength, LLE 2/5  Assessment/Plan:  Principal Problem:   Major depressive disorder, single episode, severe without psychosis (HCC) Active Problems:   Vitamin D deficiency   Multiple sclerosis (HCC)  MDD with persistent suicidal ideation: Unchanged.  --stable mood today --Continue Cymbalta 60mg  daily, and Remeron 45mg  qhs --Pending placement  MS, potential NMO: This was presenting problem initially. S/p treatment but not on maintenance therapy which is hindering psych placement esp since patient was admitted from psych to [facility] initially due to MS flare. MRI 12/5 obtained due to increased bil LE weakness showed new enhancing lesions; s/p 5 doses of high dose solumedrol. Started on Rituximab (dose 12/11), next dose due 2 wks from then for maintenance therapy then outpt follow up with Dr. Epimenio Foot.    --Vitamin D low, replacing --For further Rituximab 1000mg  dose (~12/26), can be extended to 03/08/17 will need premedication with benadryl 25mg  IV and 125mg  solumedrol IV. Third dose will be around 6 months she will follow up with Dr. Epimenio Foot.  Will need to return to [place] for Rituximab infusion. --continuing to trial d/c foley, have not been successful thus far last trial 3 days ago.   --medically stable at this time  T1DM: Has been a moving target, pt continues to skip some meals, snacks during the night - appreciate DC recs. --Lantus at 30 units qhs, --meal time Novolog 8 units TID AC, SSI ac/hs Moderate   HTN:  --better controlled today --metoprolol succinate 100mg  daily, amlodipine 10mg  daily, increased lisinopril to 20mg  daily  --decreased hydralazine to 25mg  PO TID --bmet shows stable potassium and Cr.    Dispo: medically stable with [facility].   Angelita Ingles, MD 02/14/2017, 7:29 AM [phone]

## 2017-02-14 NOTE — Progress Notes (Signed)
CSW following to facilitate discharge planning. CSW confirmed with [facility] dispatch that the sheriff's office has completed an LOG letter for the patient's payment for ambulance transport to [facility].   [facility] will arrive for patient pick-up at 9:00 AM on 02/15/17.  CSW will continue to follow until patient discharges to [facility] tomorrow morning.  Blenda Nicely, [place] Clinical Social Worker [phone]

## 2017-02-14 NOTE — Progress Notes (Addendum)
CSW working to facilitate discharge planning. CSW sent all clinical information to [facility] for review for admission. CSW spoke with Chrissie Noa, charge nurse in admissions, and information is being reviewed to ensure that the plan for follow-up treatment is agreeable. Per Chrissie Noa, preference to set up transport tomorrow morning. CSW alerted MD about discharge tomorrow morning.  CSW explained to Chrissie Noa that patient would need ambulance transport, and Chrissie Noa agreed that would be best. CSW alerted [facility] that patient will need escorted via ambulance. CSW will follow to schedule, and will keep team updated on any changes.  Blenda Nicely, [place] Clinical Social Worker [phone]

## 2017-02-15 LAB — BASIC METABOLIC PANEL
Anion gap: 6 (ref 5–15)
BUN: 26 mg/dL — ABNORMAL HIGH (ref 6–20)
CO2: 26 mmol/L (ref 22–32)
Calcium: 9 mg/dL (ref 8.9–10.3)
Chloride: 104 mmol/L (ref 101–111)
Creatinine, Ser: 1.21 mg/dL — ABNORMAL HIGH (ref 0.44–1.00)
GFR calc Af Amer: 60 mL/min — ABNORMAL LOW (ref >60)
GFR calc non Af Amer: 52 mL/min — ABNORMAL LOW (ref >60)
Glucose, Bld: 156 mg/dL — ABNORMAL HIGH (ref 65–99)
Potassium: 4.1 mmol/L (ref 3.5–5.1)
Sodium: 136 mmol/L (ref 135–145)

## 2017-02-15 LAB — GLUCOSE, CAPILLARY: Glucose-Capillary: 136 mg/dL — ABNORMAL HIGH (ref 65–99)

## 2017-02-15 NOTE — Progress Notes (Signed)
   Subjective:  Pt reports feeling fine today.  [facility].  She reports her leg weakness is about the same, still having some aching pain in her legs but no pain anywhere else, no SOB.   .   Objective:  Vital signs in last 24 hours: Vitals:   02/14/17 1005 02/14/17 1430 02/14/17 2159 02/15/17 0601  BP: (!) 112/58 (!) 114/58 (!) 150/78 (!) 173/87  Pulse: 74 76 71 70  Resp: 16 18 18 16   Temp: 99 F (37.2 C) 98.7 F (37.1 C) 97.7 F (36.5 C) 98.2 F (36.8 C)  TempSrc: Oral Oral Oral Oral  SpO2: 99% 99% 99% 99%  Weight:      Height:       BMP Latest Ref Rng & Units 02/15/2017 02/11/2017 01/14/2017  Glucose 65 - 99 mg/dL 494(W) 967(R) 916(B)  BUN 6 - 20 mg/dL 84(Y) 65(L) 16  Creatinine 0.44 - 1.00 mg/dL 9.35(T) 0.17(B) 9.39(Q)  BUN/Creat Ratio 9 - 23 - - -  Sodium 135 - 145 mmol/L 136 138 137  Potassium 3.5 - 5.1 mmol/L 4.1 4.3 4.0  Chloride 101 - 111 mmol/L 104 105 110  CO2 22 - 32 mmol/L 26 25 21(L)  Calcium 8.9 - 10.3 mg/dL 9.0 3.0(S) 9.2(Z)    Constitutional: NAD CV: RRR, no murmurs, rubs or gallops Resp: no increased work of breathing, CTAB MSK: UE strength intact, RLE 3/5 strength, LLE 2/5  Assessment/Plan:  Principal Problem:   Major depressive disorder, single episode, severe without psychosis (HCC) Active Problems:   Vitamin D deficiency   Multiple sclerosis (HCC)  MDD with persistent suicidal ideation: Unchanged.  --stable mood today --Continue Cymbalta 60mg  daily, and Remeron 45mg  qhs --Pending placement  MS, potential NMO: This was presenting problem initially. S/p treatment but not on maintenance therapy which is hindering psych placement esp since patient was admitted from psych to [facility] initially due to MS flare. MRI 12/5 obtained due to increased bil LE weakness showed new enhancing lesions; s/p 5 doses of high dose solumedrol. Started on Rituximab (dose 12/11), next dose due 2 wks from then for maintenance therapy then outpt  follow up with Dr. Epimenio Foot.  --Vitamin D low, replacing --For further Rituximab 1000mg  dose (~12/26), can be extended to 03/08/17 will need premedication with benadryl 25mg  IV and 125mg  solumedrol IV. Third dose will be around 6 months she will follow up with Dr. Epimenio Foot.  Will need to return to [place] for Rituximab infusion. --continuing to trial d/c foley, have not been successful thus far last trial 3 days ago.   --medically stable at this time  T1DM: Has been a moving target, pt continues to skip some meals, snacks during the night - appreciate DC recs. --Lantus at 30 units qhs, --meal time Novolog 8 units TID AC, SSI ac/hs Moderate   HTN:  --better controlled today --metoprolol succinate 100mg  daily, amlodipine 10mg  daily, increased lisinopril to 20mg  daily  --decreased hydralazine to 25mg  PO TID --bmet shows stable potassium and Cr.    Dispo: medically stable with [facility].   Angelita Ingles, MD 02/15/2017, 7:55 AM [phone]

## 2017-02-15 NOTE — Progress Notes (Signed)
[facility] and [name] Department escort patient to [facility] in [place] at this time.   Sim Boast, RN

## 2017-02-22 NOTE — NC FL2 (Signed)
[place] MEDICAID FL2 LEVEL OF CARE SCREENING TOOL     IDENTIFICATION  Patient Name: Debra [name] Birthdate: 01/08/1966 Sex: female Admission Date (Current Location): 01/02/2017  [facility] and [place] Number:  Producer, television/film/video and Address:  [facility], [address] [place], [place] [address]      Provider Number: [id number]  Attending Physician Name and Address:  No att. providers found  Relative Name and Phone Number:  Phoua Brelsford, Daughter, [phone] or Corrinne Eagle, Shari Heritage, [phone]    Current Level of Care: Hospital Recommended Level of Care: Skilled Nursing Facility Prior Approval Number:    Date Approved/Denied:   PASRR Number:    Discharge Plan: SNF    Current Diagnoses: Patient Active Problem List   Diagnosis Date Noted  . Major depressive disorder, single episode, severe without psychosis (HCC) 01/02/2017  . Multiple sclerosis exacerbation (HCC) 01/01/2017  . Dysthymia 12/27/2016  . Major depressive disorder, recurrent, severe with psychotic features (HCC)   . Multiple joint pain   . HTN (hypertension)   . Acute pain of left knee   . Neurogenic bladder   . Acute lower UTI   . Labile blood pressure   . Acute blood loss anemia   . Leukocytosis   . Labile blood glucose   . Hypertensive crisis   . Hypoglycemia   . Poorly controlled type 2 diabetes mellitus with peripheral neuropathy (HCC)   . Uncontrolled hypertension   . Sleep disturbance   . Multiple sclerosis (HCC) 11/22/2016  . Thoracic root lesion 11/22/2016  . MS (multiple sclerosis) (HCC)   . Neuropathic pain   . Uncontrolled type 2 diabetes mellitus with peripheral neuropathy (HCC)   . Urinary retention   . Medically noncompliant   . Leg weakness, bilateral 11/20/2016  . Diabetic ketoacidosis without coma associated with type 1 diabetes mellitus (HCC)   . Leg pain   . History of CVA (cerebrovascular accident)   . Uncontrolled type 1 diabetes  mellitus with diabetic peripheral neuropathy (HCC)   . Benign essential HTN   . Hypertensive urgency   . AKI (acute kidney injury) (HCC)   . Fibromyalgia 11/07/2016  . Back pain 10/31/2016  . Stable angina pectoris (HCC)   . Vitamin D deficiency 08/31/2014  . 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 Hyperlipidemia 10/06/2013  . Depression 10/16/2012  . Bilateral knee pain 02/16/2012  . Chronic diarrhea 10/19/2010  . Peripheral neuropathy 08/19/2010  . Type 1 diabetes mellitus (HCC) 07/06/2010  . Hypertension 07/06/2010  . Asthma 07/06/2010  . Migraine 07/06/2010    Orientation RESPIRATION BLADDER Height & Weight     Self, Time, Situation, Place  Normal Incontinent, Indwelling catheter Weight: 164 lb 14.5 oz (74.8 kg) Height:  5\' 4"  (162.6 cm)  BEHAVIORAL SYMPTOMS/MOOD NEUROLOGICAL BOWEL NUTRITION STATUS      Continent Diet, Supplemental  AMBULATORY STATUS COMMUNICATION OF NEEDS Skin   Extensive Assist Verbally Normal                       Personal Care Assistance Level of Assistance  Bathing, Dressing Bathing Assistance: Limited assistance   Dressing Assistance: Limited assistance     Functional Limitations Info  Sight Sight Info: Impaired        SPECIAL CARE FACTORS FREQUENCY  PT (By licensed PT), OT (By licensed OT)     PT Frequency: 3/week OT Frequency: 5/week  Contractures Contractures Info: Not present    Additional Factors Info  Insulin Sliding Scale       Insulin Sliding Scale Info: Novolog 100 units/ml - give 0 - 15 units three times a day with meals; Novolog 100 units/ml - give 0 - 5 units at bedtime.  Also requires Novolog 100 units/ml - give 8 units three times a day with meals.  Also requires Lantus 100 units/ml - give 0.3 mls (30 units total) at bedtime.       Current Medications (02/22/2017):  This is the current hospital active medication list No current facility-administered medications for  this encounter.    Current Outpatient Medications  Medication Sig Dispense Refill  . 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 The patient tests 4 times per day. 1 Device 0  . DULoxetine (CYMBALTA) 60 MG capsule Take 1 capsule (60 mg total) daily by mouth.  3  . gabapentin (NEURONTIN) 100 MG capsule Take 1 capsule (100 mg total) 3 (three) times daily by mouth.    . insulin aspart (NOVOLOG) 100 UNIT/ML injection Inject 8 Units 3 (three) times daily with meals into the skin. 10 mL 11  . risperiDONE (RISPERDAL) 1 MG tablet Take 1 tablet (1 mg total) at bedtime by mouth.    . tamsulosin (FLOMAX) 0.4 MG CAPS capsule Take 1 capsule (0.4 mg total) daily by mouth. 30 capsule   . amLODipine (NORVASC) 10 MG tablet Take 1 tablet (10 mg total) by mouth daily. 90 tablet 1  . aspirin EC 81 MG tablet Take 1 tablet (81 mg total) by mouth daily. 90 tablet 3  . atorvastatin (LIPITOR) 40 MG tablet Take 1 tablet (40 mg total) by mouth daily. 90 tablet 3  . baclofen 5 MG TABS Take 5 mg by mouth 3 (three) times daily. 30 each 0  . bethanechol (URECHOLINE) 50 MG tablet Take 1 tablet (50 mg total) by mouth 3 (three) times daily.    . feeding supplement, GLUCERNA SHAKE, (GLUCERNA SHAKE) LIQD Take 237 mLs by mouth 2 (two) times daily between meals.  0  . hydrALAZINE (APRESOLINE) 25 MG tablet Take 1 tablet (25 mg total) by mouth every 8 (eight) hours as needed (systolic goal 140).    . insulin aspart (NOVOLOG) 100 UNIT/ML injection Inject 0-15 Units into the skin 3 (three) times daily with meals. 10 mL 11  . insulin aspart (NOVOLOG) 100 UNIT/ML injection Inject 0-5 Units into the skin at bedtime. 10 mL 11  . insulin glargine (LANTUS) 100 UNIT/ML injection Inject 0.3 mLs (30 Units total) into the skin at bedtime. 10 mL 11  . lisinopril (PRINIVIL,ZESTRIL) 20 MG tablet Take 1 tablet (20 mg total) by mouth daily.    . magnesium hydroxide (MILK OF  MAGNESIA) 400 MG/5ML suspension Take 30 mLs by mouth daily as needed for mild constipation. 360 mL 0  . metoprolol succinate (TOPROL-XL) 100 MG 24 hr tablet Take 1 tablet (100 mg total) by mouth daily. Take with or immediately following a meal. 90 tablet 0  . mirtazapine (REMERON) 45 MG tablet Take 1 tablet (45 mg total) by mouth at bedtime.    . pantoprazole (PROTONIX) 40 MG tablet Take 1 tablet (40 mg total) by mouth daily.    . polyethylene glycol (MIRALAX / GLYCOLAX) packet Take 17 g by mouth daily as needed for mild constipation, moderate constipation or severe constipation. 14 each 0  . pregabalin (LYRICA) 100 MG  capsule Take 1 capsule (100 mg total) by mouth 3 (three) times daily.    . traMADol (ULTRAM) 50 MG tablet Take 1 tablet (50 mg total) by mouth every 6 (six) hours as needed for moderate pain or severe pain. 30 tablet   . Vitamin D, Ergocalciferol, (DRISDOL) 50000 units CAPS capsule Take 1 capsule (50,000 Units total) by mouth every 7 (seven) days. 30 capsule   . zolpidem (AMBIEN) 5 MG tablet Take 1 tablet (5 mg total) by mouth at bedtime as needed for sleep. 30 tablet 0     Discharge Medications: Please see discharge summary for a list of discharge medications.  Relevant Imaging Results:  Relevant Lab Results:   Additional Information SSN [id number].  Medicaid pending so offering letter of guarantee.  Wears glasses to address visual impairment.  Height 5'4'', weight 164 lbs.    Morrill, Janyth Riera M

## 2017-03-14 ENCOUNTER — Inpatient Hospital Stay (HOSPITAL_COMMUNITY): Payer: Medicaid Other

## 2017-03-14 ENCOUNTER — Emergency Department (HOSPITAL_COMMUNITY): Payer: Medicaid Other

## 2017-03-14 ENCOUNTER — Inpatient Hospital Stay (HOSPITAL_COMMUNITY)
Admission: EM | Admit: 2017-03-14 | Discharge: 2017-03-16 | DRG: 698 | Disposition: A | Discharge status: Skilled Nursing Facility | Payer: Medicaid Other | Attending: Internal Medicine | Admitting: Internal Medicine

## 2017-03-14 ENCOUNTER — Encounter (HOSPITAL_COMMUNITY): Payer: Self-pay | Admitting: *Deleted

## 2017-03-14 ENCOUNTER — Other Ambulatory Visit: Payer: Self-pay

## 2017-03-14 DIAGNOSIS — Y846 Urinary catheterization as the cause of abnormal reaction of the patient, or of later complication, without mention of misadventure at the time of the procedure: Secondary | ICD-10-CM | POA: Diagnosis present

## 2017-03-14 DIAGNOSIS — Z794 Long term (current) use of insulin: Secondary | ICD-10-CM

## 2017-03-14 DIAGNOSIS — E860 Dehydration: Secondary | ICD-10-CM | POA: Diagnosis present

## 2017-03-14 DIAGNOSIS — F329 Major depressive disorder, single episode, unspecified: Secondary | ICD-10-CM | POA: Diagnosis present

## 2017-03-14 DIAGNOSIS — N319 Neuromuscular dysfunction of bladder, unspecified: Secondary | ICD-10-CM | POA: Diagnosis present

## 2017-03-14 DIAGNOSIS — G2581 Restless legs syndrome: Secondary | ICD-10-CM | POA: Diagnosis present

## 2017-03-14 DIAGNOSIS — G9340 Encephalopathy, unspecified: Secondary | ICD-10-CM

## 2017-03-14 DIAGNOSIS — Z7982 Long term (current) use of aspirin: Secondary | ICD-10-CM

## 2017-03-14 DIAGNOSIS — Z880 Allergy status to penicillin: Secondary | ICD-10-CM

## 2017-03-14 DIAGNOSIS — Z8673 Personal history of transient ischemic attack (TIA), and cerebral infarction without residual deficits: Secondary | ICD-10-CM | POA: Diagnosis not present

## 2017-03-14 DIAGNOSIS — N390 Urinary tract infection, site not specified: Secondary | ICD-10-CM | POA: Diagnosis present

## 2017-03-14 DIAGNOSIS — R0902 Hypoxemia: Secondary | ICD-10-CM | POA: Diagnosis present

## 2017-03-14 DIAGNOSIS — R4182 Altered mental status, unspecified: Secondary | ICD-10-CM | POA: Diagnosis present

## 2017-03-14 DIAGNOSIS — E785 Hyperlipidemia, unspecified: Secondary | ICD-10-CM | POA: Diagnosis present

## 2017-03-14 DIAGNOSIS — T83518A Infection and inflammatory reaction due to other urinary catheter, initial encounter: Principal | ICD-10-CM | POA: Diagnosis present

## 2017-03-14 DIAGNOSIS — E1065 Type 1 diabetes mellitus with hyperglycemia: Secondary | ICD-10-CM

## 2017-03-14 DIAGNOSIS — E872 Acidosis: Secondary | ICD-10-CM | POA: Diagnosis present

## 2017-03-14 DIAGNOSIS — B9620 Unspecified Escherichia coli [E. coli] as the cause of diseases classified elsewhere: Secondary | ICD-10-CM | POA: Diagnosis present

## 2017-03-14 DIAGNOSIS — A419 Sepsis, unspecified organism: Secondary | ICD-10-CM | POA: Diagnosis present

## 2017-03-14 DIAGNOSIS — I700 Atherosclerosis of aorta: Secondary | ICD-10-CM | POA: Diagnosis present

## 2017-03-14 DIAGNOSIS — I10 Essential (primary) hypertension: Secondary | ICD-10-CM | POA: Diagnosis present

## 2017-03-14 DIAGNOSIS — N179 Acute kidney failure, unspecified: Secondary | ICD-10-CM | POA: Diagnosis present

## 2017-03-14 DIAGNOSIS — E669 Obesity, unspecified: Secondary | ICD-10-CM | POA: Diagnosis present

## 2017-03-14 DIAGNOSIS — G9341 Metabolic encephalopathy: Secondary | ICD-10-CM | POA: Diagnosis present

## 2017-03-14 DIAGNOSIS — E1042 Type 1 diabetes mellitus with diabetic polyneuropathy: Secondary | ICD-10-CM | POA: Diagnosis present

## 2017-03-14 DIAGNOSIS — Z6824 Body mass index (BMI) 24.0-24.9, adult: Secondary | ICD-10-CM

## 2017-03-14 DIAGNOSIS — T1490XA Injury, unspecified, initial encounter: Secondary | ICD-10-CM

## 2017-03-14 DIAGNOSIS — IMO0002 Reserved for concepts with insufficient information to code with codable children: Secondary | ICD-10-CM

## 2017-03-14 DIAGNOSIS — G35 Multiple sclerosis: Secondary | ICD-10-CM | POA: Diagnosis present

## 2017-03-14 HISTORY — DX: Multiple sclerosis: G35

## 2017-03-14 HISTORY — DX: Neuromuscular dysfunction of bladder, unspecified: N31.9

## 2017-03-14 LAB — URINALYSIS, ROUTINE W REFLEX MICROSCOPIC
Bilirubin Urine: NEGATIVE
Ketones, ur: NEGATIVE mg/dL
NITRITE: NEGATIVE
PH: 5 (ref 5.0–8.0)
PROTEIN: 100 mg/dL — AB
Specific Gravity, Urine: 1.015 (ref 1.005–1.030)
Squamous Epithelial / LPF: NONE SEEN

## 2017-03-14 LAB — CBC WITH DIFFERENTIAL/PLATELET
Basophils Absolute: 0 10*3/uL (ref 0.0–0.1)
Basophils Relative: 0 %
EOS PCT: 2 %
Eosinophils Absolute: 0.2 10*3/uL (ref 0.0–0.7)
HCT: 39.1 % (ref 36.0–46.0)
Hemoglobin: 12.9 g/dL (ref 12.0–15.0)
LYMPHS ABS: 2 10*3/uL (ref 0.7–4.0)
LYMPHS PCT: 19 %
MCH: 29.4 pg (ref 26.0–34.0)
MCHC: 33 g/dL (ref 30.0–36.0)
MCV: 89.1 fL (ref 78.0–100.0)
MONO ABS: 1 10*3/uL (ref 0.1–1.0)
MONOS PCT: 9 %
Neutro Abs: 7.7 10*3/uL (ref 1.7–7.7)
Neutrophils Relative %: 70 %
PLATELETS: 286 10*3/uL (ref 150–400)
RBC: 4.39 MIL/uL (ref 3.87–5.11)
RDW: 13.9 % (ref 11.5–15.5)
WBC: 10.8 10*3/uL — ABNORMAL HIGH (ref 4.0–10.5)

## 2017-03-14 LAB — I-STAT VENOUS BLOOD GAS, ED
Acid-Base Excess: 1 mmol/L (ref 0.0–2.0)
Bicarbonate: 26.9 mmol/L (ref 20.0–28.0)
O2 Saturation: 74 %
PCO2 VEN: 48.9 mmHg (ref 44.0–60.0)
PO2 VEN: 42 mmHg (ref 32.0–45.0)
TCO2: 28 mmol/L (ref 22–32)
pH, Ven: 7.348 (ref 7.250–7.430)

## 2017-03-14 LAB — CBG MONITORING, ED
GLUCOSE-CAPILLARY: 257 mg/dL — AB (ref 65–99)
Glucose-Capillary: 232 mg/dL — ABNORMAL HIGH (ref 65–99)

## 2017-03-14 LAB — COMPREHENSIVE METABOLIC PANEL
ALT: 73 U/L — ABNORMAL HIGH (ref 14–54)
ANION GAP: 13 (ref 5–15)
AST: 24 U/L (ref 15–41)
Albumin: 3.8 g/dL (ref 3.5–5.0)
Alkaline Phosphatase: 113 U/L (ref 38–126)
BUN: 37 mg/dL — ABNORMAL HIGH (ref 6–20)
CHLORIDE: 104 mmol/L (ref 101–111)
CO2: 24 mmol/L (ref 22–32)
Calcium: 10 mg/dL (ref 8.9–10.3)
Creatinine, Ser: 2.8 mg/dL — ABNORMAL HIGH (ref 0.44–1.00)
GFR, EST AFRICAN AMERICAN: 22 mL/min — AB (ref >60)
GFR, EST NON AFRICAN AMERICAN: 19 mL/min — AB (ref >60)
Glucose, Bld: 239 mg/dL — ABNORMAL HIGH (ref 65–99)
POTASSIUM: 3.6 mmol/L (ref 3.5–5.1)
Sodium: 141 mmol/L (ref 135–145)
TOTAL PROTEIN: 7.1 g/dL (ref 6.5–8.1)
Total Bilirubin: 1 mg/dL (ref 0.3–1.2)

## 2017-03-14 LAB — I-STAT CHEM 8, ED
BUN: 38 mg/dL — AB (ref 6–20)
CHLORIDE: 102 mmol/L (ref 101–111)
Calcium, Ion: 1.26 mmol/L (ref 1.15–1.40)
Creatinine, Ser: 2.8 mg/dL — ABNORMAL HIGH (ref 0.44–1.00)
Glucose, Bld: 235 mg/dL — ABNORMAL HIGH (ref 65–99)
HEMATOCRIT: 40 % (ref 36.0–46.0)
Hemoglobin: 13.6 g/dL (ref 12.0–15.0)
Potassium: 3.6 mmol/L (ref 3.5–5.1)
SODIUM: 140 mmol/L (ref 135–145)
TCO2: 27 mmol/L (ref 22–32)

## 2017-03-14 LAB — I-STAT CG4 LACTIC ACID, ED
LACTIC ACID, VENOUS: 2.03 mmol/L — AB (ref 0.5–1.9)
Lactic Acid, Venous: 1.6 mmol/L (ref 0.5–1.9)

## 2017-03-14 MED ORDER — DULOXETINE HCL 60 MG PO CPEP
60.0000 mg | ORAL_CAPSULE | Freq: Every day | ORAL | Status: DC
  Administered 2017-03-15 – 2017-03-16 (×2): 60 mg via ORAL
  Filled 2017-03-14 (×3): qty 1

## 2017-03-14 MED ORDER — PREGABALIN 100 MG PO CAPS: 100.0000 mg | ORAL_CAPSULE | Freq: Three times a day (TID) | ORAL | Status: DC

## 2017-03-14 MED ORDER — BACLOFEN 5 MG HALF TABLET
5.0000 mg | ORAL_TABLET | Freq: Three times a day (TID) | ORAL | Status: DC
  Administered 2017-03-15 – 2017-03-16 (×5): 5 mg via ORAL
  Filled 2017-03-14 (×8): qty 1

## 2017-03-14 MED ORDER — SODIUM CHLORIDE 0.9 % IV BOLUS (SEPSIS)
1000.0000 mL | Freq: Once | INTRAVENOUS | Status: AC
  Administered 2017-03-14: 1000 mL via INTRAVENOUS

## 2017-03-14 MED ORDER — ATORVASTATIN CALCIUM 40 MG PO TABS
40.0000 mg | ORAL_TABLET | Freq: Every day | ORAL | Status: DC
  Administered 2017-03-15 – 2017-03-16 (×2): 40 mg via ORAL
  Filled 2017-03-14 (×3): qty 1

## 2017-03-14 MED ORDER — NALOXONE HCL 0.4 MG/ML IJ SOLN
0.4000 mg | Freq: Once | INTRAMUSCULAR | Status: AC
  Administered 2017-03-14: 0.4 mg via INTRAVENOUS
  Filled 2017-03-14: qty 1

## 2017-03-14 MED ORDER — AZTREONAM 1 G IJ SOLR
1.0000 g | Freq: Once | INTRAMUSCULAR | Status: AC
  Administered 2017-03-14: 1 g via INTRAVENOUS
  Filled 2017-03-14: qty 1

## 2017-03-14 MED ORDER — ASPIRIN EC 81 MG PO TBEC
81.0000 mg | DELAYED_RELEASE_TABLET | Freq: Every day | ORAL | Status: DC
  Administered 2017-03-15 – 2017-03-16 (×2): 81 mg via ORAL
  Filled 2017-03-14 (×2): qty 1

## 2017-03-14 MED ORDER — SODIUM CHLORIDE 0.9 % IV BOLUS (SEPSIS)
1000.0000 mL | Freq: Once | INTRAVENOUS | Status: AC
Start: 2017-03-14 — End: 2017-03-14
  Administered 2017-03-14: 1000 mL via INTRAVENOUS

## 2017-03-14 MED ORDER — VALPROATE SODIUM 500 MG/5ML IV SOLN
300.0000 mg | Freq: Three times a day (TID) | INTRAVENOUS | Status: DC
  Administered 2017-03-15 – 2017-03-16 (×4): 300 mg via INTRAVENOUS
  Filled 2017-03-14 (×5): qty 3

## 2017-03-14 MED ORDER — HEPARIN SODIUM (PORCINE) 5000 UNIT/ML IJ SOLN
5000.0000 [IU] | Freq: Three times a day (TID) | INTRAMUSCULAR | Status: DC
  Administered 2017-03-14 – 2017-03-16 (×6): 5000 [IU] via SUBCUTANEOUS
  Filled 2017-03-14 (×6): qty 1

## 2017-03-14 MED ORDER — VALPROATE SODIUM 500 MG/5ML IV SOLN
1500.0000 mg | Freq: Once | INTRAVENOUS | Status: AC
  Administered 2017-03-14: 1500 mg via INTRAVENOUS
  Filled 2017-03-14: qty 15

## 2017-03-14 MED ORDER — SODIUM CHLORIDE 0.9 % IV SOLN
INTRAVENOUS | Status: DC
  Administered 2017-03-14 – 2017-03-16 (×3): via INTRAVENOUS

## 2017-03-14 NOTE — ED Notes (Signed)
Pt crying out saying, "Shit! My leg!" as she grabs her R leg.

## 2017-03-14 NOTE — ED Notes (Signed)
Pt resting calmly.  Son at bedside.

## 2017-03-14 NOTE — Consult Note (Addendum)
Neurology Consultation Reason for Consult: AMS Referring Physician: Dr Heide Spark  History is obtained from:chart review, patient's son   HPI: Debra [name] is a 52 y.o. female with PMH of Multiple sclerosis, depression, DM, HTN presents today after being lethargic, confusion for most of today. Her son at bedside states he had a long conversation with her yesterday night.  She was talking with her physician at facility when she stopped talking mid sentence. Patient was hypoxic and BP was 86/54 on initial assessment.   On arrival to ER, patient received IV normal saline bolus which improved mental status. Found to have lactic acidosis, mild leukocytosis and with UA suggestive of urinary tract infection. According to Er nurse, patient was crying and telling she had bilateral leg pain prior to head CT around 4pm, however on returning back from scanner was minimally responsive. Vitals were stable at that time. Neurology was consulted for altered mental status.   On initial assessment patient was drowsy, but arousal following some commands and answering simple questions. On reassessing patient approximately 2 hours later, she was unresponsive, non verbal and not following commands. Due to fluctuating mental status, decision was made to load with Valproic acid and stat video EEG ordered.    Regarding MS diagnosis:   Presenting on Sept 26/18 with optic neuritis and bilateral leg weakness. Found to have thoracic spinal cord lesion, and MRI brain showed multiple subcortical and periventricular white matter lesions, some of them with restriction diffussion indicative of active demyelinating disease as well as enhancing lesions in MRI C spine. Received solumedrol x 5 days and discharged. However she presented shortly after to [facility] for suicidal ideation and developed vision loss and worsening weakness. Transferred to [facility] for plasmapheresis. Underwent LP, elevated protein, however oligoclonal bands  negative, NMO negative as well. She continued to decline while in rehab with worsening of weakness and repeat MRI showed new lesions in MRI C spine. She received Rituximab after discussion with Dr Epimenio Foot, MS specialist. She was discharged to psychiatric hospital. According to son, she was declining in the hospital as well.       ROS:  Unable to obtain due to altered mental status.   Past Medical History:  Diagnosis Date  . Adenomatous colonic polyps   . Anemia    2005  . Anxiety    1990  . Arthritis   . Asthma    2000  . Cataract   . Depression with psychotic symptoms   . Difficult intubation    narrow airway  . Gastroesophageal Reflux Disease (GERD)   . Heart murmur    Birth  . Hyperlipidemia    2005  . Hypertension    1998  . Internal hemorrhoids 04/24/06   on colonoscopy  . Migraine   . Multiple sclerosis (HCC)   . Neuropathic bladder   . Neuropathy of the hands & feet   . Restless Leg Syndrome   . Right Ankle Fracture 10/06/2013  . Sleep paralysis   . Stable angina pectoris    2007: cath showing normal cors.   . Stroke 1990  . Type I Diabetes Mellitus 1988    Family History  Problem Relation Age of Onset  . Hypertension Mother   . Kidney disease Mother   . Hypertension Father   . Breast cancer Maternal Grandmother   . Prostate cancer Maternal Grandfather   . Ovarian cancer Paternal Grandmother   . Prostate cancer Paternal Grandfather   . Colon cancer Maternal Uncle  Family history of malignant neoplasm of gastrointestinal tract    Social History:  reports that  has never smoked. she has never used smokeless tobacco. She reports that she does not drink alcohol or use drugs.   Exam: Current vital signs: BP 131/70   Pulse 77   Temp 99.5 F (37.5 C) (Rectal)   Resp 11   SpO2 100%  Vital signs in last 24 hours: Temp:  [99.5 F (37.5 C)] 99.5 F (37.5 C) (01/16 1206) Pulse Rate:  [30-94] 77 (01/16 1845) Resp:  [0-22] 11 (01/16 1845) BP:  (89-174)/(57-135) 131/70 (01/16 1845) SpO2:  [48 %-100 %] 100 % (01/16 1845)   Physical Exam  Constitutional: Obese Psych: Affect appropriate to situation Eyes: No scleral injection HENT: No OP obstrucion Head: Normocephalic.  Cardiovascular: Normal rate and regular rhythm.  Respiratory: Effort normal, non-labored breathing GI: Soft.  No distension. There is no tenderness.  Skin: WDI  Neuro: Mental Status: Patient is drowsy, follows simple commands and answers simple questions like her name, if she is in pain etc.  Cranial Nerves: Pupils are equal, round, and reactive to light.   Unable to assess visual fields, blinks to threat EOMI : tracks in all directions Facial movement is symmetric.   Motor: Tone is normal. Bulk is normal. 4/5 strength in upper extremities 2/5 in lower extremities bilaterally  Sensory: Withdraws to noxious stimuli bilaterally Deep Tendon Reflexes: 1+ and symmetric in the biceps and patellae. No  Plantars: Toes are downgoing bilaterally.  Cerebellar: FNF and HKS are intact bilaterally   Lab Results: Basic Metabolic Panel: Recent Labs  Lab 03/14/17 1153 03/14/17 1222 03/15/17 0027  NA 141 140  --   K 3.6 3.6  --   CL 104 102  --   CO2 24  --   --   GLUCOSE 239* 235*  --   BUN 37* 38*  --   CREATININE 2.80* 2.80*  --   CALCIUM 10.0  --   --   MG  --   --  1.9    CBC: Recent Labs  Lab 03/14/17 1153 03/14/17 1222  WBC 10.8*  --   NEUTROABS 7.7  --   HGB 12.9 13.6  HCT 39.1 40.0  MCV 89.1  --   PLT 286  --     Coagulation Studies: No results for input(s): LABPROT, INR in the last 72 hours.  Imaging: Ct Abdomen Pelvis Wo Contrast  Result Date: 03/14/2017 CLINICAL DATA:  Nausea and vomiting. Syncope. Abdominal distention. EXAM: CT ABDOMEN AND PELVIS WITHOUT CONTRAST TECHNIQUE: Multidetector CT imaging of the abdomen and pelvis was performed following the standard protocol without IV contrast. COMPARISON:  CT abdomen pelvis  11/15/2016 FINDINGS: The examination is degraded by motion. Lower chest: No pulmonary nodules or pleural effusion. No visible pericardial effusion. Hepatobiliary: Normal hepatic contours and density. No visible biliary dilatation. Status post cholecystectomy. Pancreas: Normal contours without ductal dilatation. No peripancreatic fluid collection. Spleen: Normal. Adrenals/Urinary Tract: --Adrenal glands: Normal. --Right kidney/ureter: No hydronephrosis or perinephric stranding. No nephrolithiasis. No obstructing ureteral stones. --Left kidney/ureter: No hydronephrosis or perinephric stranding. No nephrolithiasis. No obstructing ureteral stones. --Urinary bladder: The bladder wall is thickened. There is an indwelling Foley catheter. Stomach/Bowel: --Stomach/Duodenum: No hiatal hernia or other gastric abnormality. Normal duodenal course and caliber. --Small bowel: No dilatation or inflammation. --Colon: No focal abnormality. --Appendix: Normal. Vascular/Lymphatic: Atherosclerotic calcification is present within the non-aneurysmal abdominal aorta, without hemodynamically significant stenosis. No abdominal or pelvic lymphadenopathy. Reproductive: Status post tubal  ligation.  Normal uterus. Musculoskeletal. No bony spinal canal stenosis or focal osseous abnormality. Other: Multiple injection granulomata within the anterior subcutaneous fat. IMPRESSION: 1. Motion degraded examination. 2. Bladder wall thickening may be due to underdistention, though cystitis may also have this appearance. 3.  Aortic Atherosclerosis (ICD10-I70.0). Electronically Signed   By: Deatra Robinson M.D.   On: 03/14/2017 15:36   Ct Head Wo Contrast  Result Date: 03/14/2017 CLINICAL DATA:  Acute onset loss of consciousness today. The patient is unresponsive. History of multiple sclerosis. EXAM: CT HEAD WITHOUT CONTRAST TECHNIQUE: Contiguous axial images were obtained from the base of the skull through the vertex without intravenous contrast.  COMPARISON:  Head CT scan 10/02/2013.  Brain MRI 12/31/2016. FINDINGS: Brain: Scattered areas of hypoattenuation in the periventricular deep white matter consistent with the patient's history of multiple sclerosis are identified. No evidence of acute abnormality including hemorrhage, infarct, mass lesion, mass effect, midline shift or abnormal extra-axial fluid collection is seen. There is no hydrocephalus or pneumocephalus. Vascular: Atherosclerosis noted. Skull: Intact. Sinuses/Orbits: Mucosal thickening and wall thickening in the right maxillary sinus are again seen. There is also mucosal thickening in scattered ethmoid air cells, the sphenoid sinuses and left frontal sinus. Ethmoid air cell disease is improved since the prior CT scan. Other: None. IMPRESSION: No acute abnormality. Findings consistent with the patient's history of multiple sclerosis. Chronic sinus disease. Electronically Signed   By: Drusilla Kanner M.D.   On: 03/14/2017 11:37   Mr Brain Wo Contrast  Result Date: 03/14/2017 CLINICAL DATA:  History of multiple sclerosis. Altered mental status. Confusion. Poorly responsive. EXAM: MRI HEAD WITHOUT CONTRAST TECHNIQUE: Multiplanar, multiecho pulse sequences of the brain and surrounding structures were obtained without intravenous contrast. COMPARISON:  CT same day.  MRI 12/31/2016 FINDINGS: Brain: Since the previous study, there has been enlargement of the lesion within the right side of the splenium of the corpus callosum. I think there is also more abnormal signal within the middle cerebellar peduncles and within the body of the corpus callosum. No other progressive change. Multiple other foci of T2 and FLAIR signal within the brainstem and hemispheric deep white matter appear similar in number and size. There is resolution of restricted diffusion of the frontal lesions seen on the previous study. No new lesions show restricted diffusion. No sign of hemorrhage, hydrocephalus or extra-axial  collection. Vascular: Major vessels at the base of the brain show flow. Skull and upper cervical spine: Negative Sinuses/Orbits: Redemonstration of mucosal inflammatory changes of the paranasal sinuses. Orbits negative. Other: None IMPRESSION: Multiple lesions of the corpus callosum are progressive since the previous study. I also think there is more abnormal signal within the middle cerebellar peduncles. This is consistent with progression of the demyelinating disease. Other supratentorial white matter lesions do not appear progressive. In fact, previously seen foci in the frontal lobes which showed restricted diffusion no longer do so. Electronically Signed   By: Paulina Fusi M.D.   On: 03/14/2017 20:45   Dg Chest Portable 1 View  Result Date: 03/14/2017 CLINICAL DATA:  Altered mental status EXAM: PORTABLE CHEST 1 VIEW COMPARISON:  Chest x-ray of 01/10/2017 FINDINGS: No definite pneumonia or pleural effusion is seen. Mediastinal and hilar contours appear stable and cardiomegaly is stable. No acute bony abnormality is noted. IMPRESSION: No active lung disease.  Stable cardiomegaly. Electronically Signed   By: Dwyane Dee M.D.   On: 03/14/2017 12:33   Dg Hip Port Unilat With Pelvis 1v Right  Result Date: 03/14/2017  CLINICAL DATA:  Right hip pain. Syncopal episode in a chair. No fall. EXAM: DG HIP (WITH OR WITHOUT PELVIS) 1V PORT RIGHT COMPARISON:  Bilateral hip x-rays dated November 16, 2016. FINDINGS: There is no evidence of hip fracture or dislocation. There is no evidence of arthropathy or other focal bone abnormality. Prior tubal ligation. Multiple phleboliths in the pelvis are unchanged. IMPRESSION: Negative. Electronically Signed   By: Obie Dredge M.D.   On: 03/14/2017 15:05     ASSESSMENT AND PLAN  52 y.o. female with PMH of Multiple sclerosis, depression, DM, HTN presents with 1 day AMS and witnessed episode where patient became suddenly unresponsive and hypoxic. On arrival patient is  hypotensive, had mild leukocytosis and UA suggestive of UTI. Also fund to have acute kidney injury/Patient started antibiotics and admitted to medicine team. Still would have waxing and waning of mental status. Patient was loaded with Depakon and stat EEG ordered.    Aggressive Multiple Sclerosis Metabolic Encephalopathy Possible Non convulsive seizures - EEG negative for NCSE  Acute Kidney injury Urinary tract infection  Plan Stat LTM EEG - no obvious seizures/epileptiform activity was seen. Official read pending. Patient already received Depakon at time of EEG. Will continue to monitor.   MRI Brain w/o contrast ordered: New lesions since prior MRI [facility] in November. No acute stroke.  Treat underlying infection, metabolic disturbances. Check CXR, Blood CX, etc. Avoid Cefepime/Ceftriaxone if possible.  IV fluids, management of acute kidney injury  F/U ammonia, UDS, TSH levels  I spent 60 min in the care of this neurologically critically ill patient presenting with AMS, possible status. I was involved in treatment and decision making  including administering AED, obtaining and reviewing EEG as well discussion with family. Patient is risk for neurological decline from her condition.   Sushanth Aroor [facility] Pager Number [phone]

## 2017-03-14 NOTE — ED Notes (Signed)
No changes with narcan

## 2017-03-14 NOTE — ED Provider Notes (Signed)
[facility] EMERGENCY DEPARTMENT Provider Note   CSN: [id number] Arrival date & time: 03/14/17  1054     History   Chief Complaint Chief Complaint  Patient presents with  . Altered Mental Status    HPI Debra [name] is a 52 y.o. female.  HPI   52 year old female with extensive past medical history as below here with altered mental status.  History limited due to patient's confusion.  Per report, the patient was in her usual state of health yesterday.  She reportedly was found in her facility very confused and minimally responsive throughout the day today.  She has had little to no p.o. intake.  On my assessment, the patient is confused.  She is responsive to sternal rub only but is protecting her airway.  No pooling of secretions.  On discussion with the son, who arrived later, the patient was in her usual state of health last night.  They talked her several hours.  She was without complaints.  Level 5 caveat invoked as remainder of history, ROS, and physical exam limited due to patient's altered mental status.   Past Medical History:  Diagnosis Date  . Adenomatous colonic polyps   . Anemia    2005  . Anxiety    1990  . Arthritis   . Asthma    2000  . Cataract   . Depression with psychotic symptoms   . Difficult intubation    narrow airway  . Gastroesophageal Reflux Disease (GERD)   . Heart murmur    Birth  . Hyperlipidemia    2005  . Hypertension    1998  . Internal hemorrhoids 04/24/06   on colonoscopy  . Migraine   . Multiple sclerosis (HCC)   . Neuropathic bladder   . Neuropathy of the hands & feet   . Restless Leg Syndrome   . Right Ankle Fracture 10/06/2013  . Sleep paralysis   . Stable angina pectoris    2007: cath showing normal cors.   . Stroke 1990  . Type I Diabetes Mellitus 1988    Patient Active Problem List   Diagnosis Date Noted  . Major depressive disorder, single episode, severe without psychosis (HCC)  01/02/2017  . Multiple sclerosis exacerbation (HCC) 01/01/2017  . Dysthymia 12/27/2016  . Major depressive disorder, recurrent, severe with psychotic features (HCC)   . Multiple joint pain   . HTN (hypertension)   . Acute pain of left knee   . Neurogenic bladder   . Acute lower UTI   . Labile blood pressure   . Acute blood loss anemia   . Leukocytosis   . Labile blood glucose   . Hypertensive crisis   . Hypoglycemia   . Poorly controlled type 2 diabetes mellitus with peripheral neuropathy (HCC)   . Uncontrolled hypertension   . Sleep disturbance   . Multiple sclerosis (HCC) 11/22/2016  . Thoracic root lesion 11/22/2016  . MS (multiple sclerosis) (HCC)   . Neuropathic pain   . Uncontrolled type 2 diabetes mellitus with peripheral neuropathy (HCC)   . Urinary retention   . Medically noncompliant   . Leg weakness, bilateral 11/20/2016  . Diabetic ketoacidosis without coma associated with type 1 diabetes mellitus (HCC)   . Leg pain   . History of CVA (cerebrovascular accident)   . Uncontrolled type 1 diabetes mellitus with diabetic peripheral neuropathy (HCC)   . Benign essential HTN   . Hypertensive urgency   . AKI (acute kidney injury) (HCC)   .  Fibromyalgia 11/07/2016  . Back pain 10/31/2016  . Stable angina pectoris (HCC)   . Vitamin D deficiency 08/31/2014  . 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 Hyperlipidemia 10/06/2013  . Depression 10/16/2012  . Bilateral knee pain 02/16/2012  . Chronic diarrhea 10/19/2010  . Peripheral neuropathy 08/19/2010  . Type 1 diabetes mellitus (HCC) 07/06/2010  . Hypertension 07/06/2010  . Asthma 07/06/2010  . Migraine 07/06/2010    Past Surgical History:  Procedure Laterality Date  . bilateral foot surgery    . BREAST SURGERY Left    biopsy left breast  . CARDIAC CATHETERIZATION  2007   around 2007 or 2008  . CESAREAN SECTION    . CHOLECYSTECTOMY    . COLONOSCOPY W/ BIOPSIES AND POLYPECTOMY    .  DILATION AND CURETTAGE OF UTERUS  12/29/2010   Surgeon: Tereso Newcomer, MD;  Location: [facility];  Service: Gynecology  . ENDOMETRIAL ABLATION W/ NOVASURE N/A 12/2009  . EYE SURGERY    . HYSTEROSCOPY  12/29/2010   Procedure: HYSTEROSCOPY WITH HYDROTHERMAL ABLATION;  Surgeon: Tereso Newcomer, MD;  Location: [facility];  Service: Gynecology;;  . IUD REMOVAL  12/29/2010   Procedure: INTRAUTERINE DEVICE (IUD) REMOVAL;  Surgeon: Tereso Newcomer, MD;  Location: [facility];  Service: Gynecology;  Laterality: N/A;  . LAPAROSCOPIC TUBAL LIGATION  12/29/2010   Procedure: LAPAROSCOPIC TUBAL LIGATION;  Surgeon: Tereso Newcomer, MD;  Location: [facility];  Service: Gynecology;  Laterality: Bilateral;  . ORIF ANKLE FRACTURE Right 10/09/2013   Procedure: Open reduction internal fixation right ankle;  Surgeon: Mable Paris, MD;  Location: [facility] OR;  Service: Orthopedics;  Laterality: Right;  Open reduction internal fixation right ankle  . TRIGGER FINGER RELEASE     x 3    OB History    Gravida Para Term Preterm AB Living   2 2 1 1  0 2   SAB TAB Ectopic Multiple Live Births   0 0 0 0 2       Home Medications    Prior to Admission medications   Medication Sig Start Date End Date Taking? Authorizing Provider  acetaminophen (TYLENOL) 325 MG tablet Take 650 mg by mouth every 6 (six) hours as needed for mild pain or fever.   Yes [provider]  amLODipine (NORVASC) 10 MG tablet Take 1 tablet (10 mg total) by mouth daily. 02/14/17 02/14/18 Yes Angelita Ingles, MD  aspirin EC 81 MG tablet Take 1 tablet (81 mg total) by mouth daily. 02/14/17  Yes Angelita Ingles, MD  atorvastatin (LIPITOR) 40 MG tablet Take 1 tablet (40 mg total) by mouth daily. 02/14/17  Yes Angelita Ingles, MD  baclofen 5 MG TABS Take 5 mg by mouth 3 (three) times daily. 02/14/17  Yes Angelita Ingles, MD  bethanechol (URECHOLINE) 50 MG tablet Take 1 tablet (50 mg total) by mouth 3 (three) times daily. 02/14/17  Yes Angelita Ingles, MD  Blood Glucose Monitoring Suppl (AGAMATRIX PRESTO PRO METER) DEVI The patient is insulin requiring, ICD 10 code E10.9. The patient tests 4 times per day. 12/15/16  Yes Angiulli, Mcarthur Rossetti, PA-C  DULoxetine (CYMBALTA) 60 MG capsule Take 1 capsule (60 mg total) daily by mouth. 01/03/17  Yes Gouru, Deanna Artis, MD  feeding supplement, GLUCERNA SHAKE, (GLUCERNA SHAKE) LIQD Take 237 mLs by mouth 2 (two) times daily between meals. 02/14/17  Yes Angelita Ingles, MD  GLUCAGON HCL, RDNA, IJ Inject 1 mg into the  muscle as needed (hypoglycemia-administer for CBG <60).   Yes [provider]  heparin 5000 UNIT/ML injection Inject 5,000 Units into the skin daily.   Yes [provider]  insulin aspart (NOVOLOG) 100 UNIT/ML injection Inject 8 Units 3 (three) times daily with meals into the skin. Patient taking differently: Inject 8 Units into the skin See admin instructions. Pt uses 8 units subcutaneously three times daily before meals - [place] also uses sliding scale as needed throughout the day if 250-299=2 units, 300-349=3 units, 350-400=4 units >400 call MD 01/02/17  Yes Gouru, Aruna, MD  insulin glargine (LANTUS) 100 UNIT/ML injection Inject 0.3 mLs (30 Units total) into the skin at bedtime. Patient taking differently: Inject 36 Units into the skin at bedtime.  02/14/17  Yes Angelita Ingles, MD  lisinopril (PRINIVIL,ZESTRIL) 20 MG tablet Take 1 tablet (20 mg total) by mouth daily. 02/15/17  Yes Angelita Ingles, MD  magnesium hydroxide (MILK OF MAGNESIA) 400 MG/5ML suspension Take 30 mLs by mouth daily as needed for mild constipation. Patient taking differently: Take 2,400 mLs by mouth every 3 (three) days as needed for mild constipation.  02/14/17  Yes Angelita Ingles, MD  metoprolol succinate (TOPROL-XL) 100 MG 24 hr tablet Take 1 tablet (100 mg total) by mouth daily. Take with or immediately following a meal. 02/15/17  Yes Winfrey, Kimberlee Nearing, MD  mirtazapine (REMERON) 45  MG tablet Take 1 tablet (45 mg total) by mouth at bedtime. 02/14/17  Yes Angelita Ingles, MD  ondansetron (ZOFRAN) 4 MG tablet Take 4 mg by mouth daily.   Yes [provider]  polyethylene glycol (MIRALAX / GLYCOLAX) packet Take 17 g by mouth daily as needed for mild constipation, moderate constipation or severe constipation. 02/14/17  Yes Angelita Ingles, MD  pregabalin (LYRICA) 100 MG capsule Take 1 capsule (100 mg total) by mouth 3 (three) times daily. 02/14/17  Yes Angelita Ingles, MD  traMADol (ULTRAM) 50 MG tablet Take 1 tablet (50 mg total) by mouth every 6 (six) hours as needed for moderate pain or severe pain. 02/14/17  Yes Angelita Ingles, MD  Vitamin D, Ergocalciferol, (DRISDOL) 50000 units CAPS capsule Take 1 capsule (50,000 Units total) by mouth every 7 (seven) days. 02/15/17  Yes Angelita Ingles, MD  gabapentin (NEURONTIN) 100 MG capsule Take 1 capsule (100 mg total) 3 (three) times daily by mouth. Patient not taking: Reported on 03/14/2017 01/02/17   Ramonita Lab, MD  hydrALAZINE (APRESOLINE) 25 MG tablet Take 1 tablet (25 mg total) by mouth every 8 (eight) hours as needed (systolic goal 140). Patient not taking: Reported on 03/14/2017 02/14/17   Angelita Ingles, MD  insulin aspart (NOVOLOG) 100 UNIT/ML injection Inject 0-15 Units into the skin 3 (three) times daily with meals. Patient not taking: Reported on 03/14/2017 02/14/17   Angelita Ingles, MD  insulin aspart (NOVOLOG) 100 UNIT/ML injection Inject 0-5 Units into the skin at bedtime. Patient not taking: Reported on 03/14/2017 02/14/17   Angelita Ingles, MD  pantoprazole (PROTONIX) 40 MG tablet Take 1 tablet (40 mg total) by mouth daily. Patient not taking: Reported on 03/14/2017 02/15/17   Angelita Ingles, MD  risperiDONE (RISPERDAL) 1 MG tablet Take 1 tablet (1 mg total) at bedtime by mouth. Patient not taking: Reported on 03/14/2017 01/02/17   Ramonita Lab, MD  tamsulosin (FLOMAX) 0.4 MG CAPS  capsule Take 1 capsule (0.4 mg total) daily by mouth. Patient not taking: Reported on 03/14/2017 01/03/17  Gouru, Deanna Artis, MD  zolpidem (AMBIEN) 5 MG tablet Take 1 tablet (5 mg total) by mouth at bedtime as needed for sleep. Patient not taking: Reported on 03/14/2017 02/14/17   Angelita Ingles, MD    Family History Family History  Problem Relation Age of Onset  . Hypertension Mother   . Kidney disease Mother   . Hypertension Father   . Breast cancer Maternal Grandmother   . Prostate cancer Maternal Grandfather   . Ovarian cancer Paternal Grandmother   . Prostate cancer Paternal Grandfather   . Colon cancer Maternal Uncle        Family history of malignant neoplasm of gastrointestinal tract    Social History Social History   Tobacco Use  . Smoking status: Never Smoker  . Smokeless tobacco: Never Used  Substance Use Topics  . Alcohol use: No    Alcohol/week: 0.0 oz  . Drug use: No    Comment: drug addict     Allergies   Penicillins; Pollen extract; and Tape   Review of Systems Review of Systems  Unable to perform ROS: Mental status change     Physical Exam Updated Vital Signs BP 103/60   Pulse 76   Temp 99.5 F (37.5 C) (Rectal)   Resp 12   SpO2 100%   Physical Exam  Constitutional: She appears well-developed.  Chronically ill-appearing  HENT:  Head: Normocephalic and atraumatic.  Dry MM  Eyes: Conjunctivae are normal.  Pupils round, reactive. Blinks to threat. Roaming eye movements, does not orient to speaker.  Neck: Neck supple.  Cardiovascular: Regular rhythm and normal heart sounds. Tachycardia present.  Pulmonary/Chest: Effort normal. No respiratory distress. She has no wheezes.  Abdominal: Soft. She exhibits no distension. There is no tenderness. There is no guarding.  Musculoskeletal: She exhibits no edema.  No midline TTP. No apparent sacral wounds. No hip TTP.  Neurological:  Markedly drowsy. Moans, withdraws to painful stimuli but otherwise  lethargic. No pooling of secretions. Intact gag. Noted to move b/l UE to painful stimuli. Withdraws LE as well bilaterally.   Skin: Skin is warm. Capillary refill takes less than 2 seconds. No rash noted.  Nursing note and vitals reviewed.    ED Treatments / Results  Labs (all labs ordered are listed, but only abnormal results are displayed) Labs Reviewed  CBC WITH DIFFERENTIAL/PLATELET - Abnormal; Notable for the following components:      Result Value   WBC 10.8 (*)    All other components within normal limits  COMPREHENSIVE METABOLIC PANEL - Abnormal; Notable for the following components:   Glucose, Bld 239 (*)    BUN 37 (*)    Creatinine, Ser 2.80 (*)    ALT 73 (*)    GFR calc non Af Amer 19 (*)    GFR calc Af Amer 22 (*)    All other components within normal limits  URINALYSIS, ROUTINE W REFLEX MICROSCOPIC - Abnormal; Notable for the following components:   APPearance TURBID (*)    Glucose, UA >=500 (*)    Hgb urine dipstick MODERATE (*)    Protein, ur 100 (*)    Leukocytes, UA MODERATE (*)    Bacteria, UA MANY (*)    All other components within normal limits  CBG MONITORING, ED - Abnormal; Notable for the following components:   Glucose-Capillary 232 (*)    All other components within normal limits  I-STAT CG4 LACTIC ACID, ED - Abnormal; Notable for the following components:   Lactic Acid, Venous  2.03 (*)    All other components within normal limits  I-STAT CHEM 8, ED - Abnormal; Notable for the following components:   BUN 38 (*)    Creatinine, Ser 2.80 (*)    Glucose, Bld 235 (*)    All other components within normal limits  CBG MONITORING, ED - Abnormal; Notable for the following components:   Glucose-Capillary 257 (*)    All other components within normal limits  CULTURE, BLOOD (ROUTINE X 2)  CULTURE, BLOOD (ROUTINE X 2)  URINE CULTURE  I-STAT VENOUS BLOOD GAS, ED  I-STAT CG4 LACTIC ACID, ED    EKG  EKG Interpretation  Date/Time:  Wednesday March 14 2017 11:04:39 EST Ventricular Rate:  76 PR Interval:    QRS Duration: 98 QT Interval:  405 QTC Calculation: 456 R Axis:   86 Text Interpretation:  Sinus or ectopic atrial rhythm Low voltage, precordial leads No significant change since last tracing Confirmed by Shaune Pollack [phone]) on 03/14/2017 6:04:35 PM       Radiology Ct Abdomen Pelvis Wo Contrast  Result Date: 03/14/2017 CLINICAL DATA:  Nausea and vomiting. Syncope. Abdominal distention. EXAM: CT ABDOMEN AND PELVIS WITHOUT CONTRAST TECHNIQUE: Multidetector CT imaging of the abdomen and pelvis was performed following the standard protocol without IV contrast. COMPARISON:  CT abdomen pelvis 11/15/2016 FINDINGS: The examination is degraded by motion. Lower chest: No pulmonary nodules or pleural effusion. No visible pericardial effusion. Hepatobiliary: Normal hepatic contours and density. No visible biliary dilatation. Status post cholecystectomy. Pancreas: Normal contours without ductal dilatation. No peripancreatic fluid collection. Spleen: Normal. Adrenals/Urinary Tract: --Adrenal glands: Normal. --Right kidney/ureter: No hydronephrosis or perinephric stranding. No nephrolithiasis. No obstructing ureteral stones. --Left kidney/ureter: No hydronephrosis or perinephric stranding. No nephrolithiasis. No obstructing ureteral stones. --Urinary bladder: The bladder wall is thickened. There is an indwelling Foley catheter. Stomach/Bowel: --Stomach/Duodenum: No hiatal hernia or other gastric abnormality. Normal duodenal course and caliber. --Small bowel: No dilatation or inflammation. --Colon: No focal abnormality. --Appendix: Normal. Vascular/Lymphatic: Atherosclerotic calcification is present within the non-aneurysmal abdominal aorta, without hemodynamically significant stenosis. No abdominal or pelvic lymphadenopathy. Reproductive: Status post tubal ligation.  Normal uterus. Musculoskeletal. No bony spinal canal stenosis or focal osseous abnormality.  Other: Multiple injection granulomata within the anterior subcutaneous fat. IMPRESSION: 1. Motion degraded examination. 2. Bladder wall thickening may be due to underdistention, though cystitis may also have this appearance. 3.  Aortic Atherosclerosis (ICD10-I70.0). Electronically Signed   By: Deatra Robinson M.D.   On: 03/14/2017 15:36   Ct Head Wo Contrast  Result Date: 03/14/2017 CLINICAL DATA:  Acute onset loss of consciousness today. The patient is unresponsive. History of multiple sclerosis. EXAM: CT HEAD WITHOUT CONTRAST TECHNIQUE: Contiguous axial images were obtained from the base of the skull through the vertex without intravenous contrast. COMPARISON:  Head CT scan 10/02/2013.  Brain MRI 12/31/2016. FINDINGS: Brain: Scattered areas of hypoattenuation in the periventricular deep white matter consistent with the patient's history of multiple sclerosis are identified. No evidence of acute abnormality including hemorrhage, infarct, mass lesion, mass effect, midline shift or abnormal extra-axial fluid collection is seen. There is no hydrocephalus or pneumocephalus. Vascular: Atherosclerosis noted. Skull: Intact. Sinuses/Orbits: Mucosal thickening and wall thickening in the right maxillary sinus are again seen. There is also mucosal thickening in scattered ethmoid air cells, the sphenoid sinuses and left frontal sinus. Ethmoid air cell disease is improved since the prior CT scan. Other: None. IMPRESSION: No acute abnormality. Findings consistent with the patient's history of multiple sclerosis. Chronic  sinus disease. Electronically Signed   By: Drusilla Kanner M.D.   On: 03/14/2017 11:37   Dg Chest Portable 1 View  Result Date: 03/14/2017 CLINICAL DATA:  Altered mental status EXAM: PORTABLE CHEST 1 VIEW COMPARISON:  Chest x-ray of 01/10/2017 FINDINGS: No definite pneumonia or pleural effusion is seen. Mediastinal and hilar contours appear stable and cardiomegaly is stable. No acute bony abnormality is  noted. IMPRESSION: No active lung disease.  Stable cardiomegaly. Electronically Signed   By: Dwyane Dee M.D.   On: 03/14/2017 12:33   Dg Hip Port Unilat With Pelvis 1v Right  Result Date: 03/14/2017 CLINICAL DATA:  Right hip pain. Syncopal episode in a chair. No fall. EXAM: DG HIP (WITH OR WITHOUT PELVIS) 1V PORT RIGHT COMPARISON:  Bilateral hip x-rays dated November 16, 2016. FINDINGS: There is no evidence of hip fracture or dislocation. There is no evidence of arthropathy or other focal bone abnormality. Prior tubal ligation. Multiple phleboliths in the pelvis are unchanged. IMPRESSION: Negative. Electronically Signed   By: Obie Dredge M.D.   On: 03/14/2017 15:05    Procedures .Critical Care Performed by: Shaune Pollack, MD Authorized by: Shaune Pollack, MD   Critical care provider statement:    Critical care time (minutes):  35   Critical care time was exclusive of:  Separately billable procedures and treating other patients and teaching time   Critical care was necessary to treat or prevent imminent or life-threatening deterioration of the following conditions:  Sepsis and circulatory failure   Critical care was time spent personally by me on the following activities:  Development of treatment plan with patient or surrogate, discussions with consultants, evaluation of patient's response to treatment, examination of patient, obtaining history from patient or surrogate, ordering and performing treatments and interventions, ordering and review of laboratory studies, ordering and review of radiographic studies, pulse oximetry, re-evaluation of patient's condition and review of old charts   I assumed direction of critical care for this patient from another provider in my specialty: no     (including critical care time)  Medications Ordered in ED Medications  naloxone (NARCAN) injection 0.4 mg (0 mg Intravenous Hold 03/14/17 1315)  sodium chloride 0.9 % bolus 1,000 mL (0 mLs Intravenous  Stopped 03/14/17 1308)  sodium chloride 0.9 % bolus 1,000 mL (0 mLs Intravenous Stopped 03/14/17 1424)  aztreonam (AZACTAM) 1 g in dextrose 5 % 50 mL IVPB (0 g Intravenous Stopped 03/14/17 1455)     Initial Impression / Assessment and Plan / ED Course  I have reviewed the triage vital signs and the nursing notes.  Pertinent labs & imaging results that were available during my care of the patient were reviewed by me and considered in my medical decision making (see chart for details).     52 year old female here with altered mental status.  On my assessment, her exam is consistent with likely acute encephalopathy.  She does appear to be tachycardic and hypotensive.  Concern for possible sepsis with subsequent metabolic encephalopathy.  Lab work and CT head obtained.  She has no focal deficits on my exam.  Lab work does show moderate leukocytosis, acute kidney injury, as well as possible UTI.  Patient has been covered broadly for this.  Otherwise, lactic acid is clearing.  She is now increasingly alert.  She does complain of back and thigh pain.  CT without acute abnormality.  She is moving b/l LE with intact distal pulses. No apparent trauma or cord compression. Pt will need close  monitoring for ongoing encephalopathy, sepsis 2/2 UTI. She has been given multiple fluid boluses, broad-spectrum IV ABX.  Final Clinical Impressions(s) / ED Diagnoses   Final diagnoses:  Acute encephalopathy  Sepsis due to urinary tract infection (HCC)  AKI (acute kidney injury) [facility])    ED Discharge Orders    None       Shaune Pollack, MD 03/14/17 1806

## 2017-03-14 NOTE — ED Triage Notes (Signed)
Pt here from [facility] via [facility] for acute onset loss of consciousness, mid sentence while speaking to a physician.  Initial O2 sats were 87% on RA.  Placed on NRB and incrased to 99%.  Hr 78, bp 86/54, cbg 245.    Initial GCS was 11 and increased to 14 in the EMS truck.

## 2017-03-14 NOTE — ED Notes (Signed)
Patient transported to MRI 

## 2017-03-14 NOTE — ED Notes (Signed)
Pt CBG was 232, notified Dr. Erma Heritage

## 2017-03-14 NOTE — ED Notes (Addendum)
Admitting at bedside.  Pt responsive to pain only.  Pupils remain sluggish and equal.

## 2017-03-14 NOTE — Progress Notes (Signed)
STAT LTM started per Dr Laurence Slate.

## 2017-03-14 NOTE — H&P (Signed)
Date: 03/14/2017               Patient Name:  Debra [name] MRN: [id number]  DOB: Nov 20, 1965 Age / Sex: 52 y.o., female   PCP: [name], MD         Medical Service: Internal Medicine Teaching Service         Attending Physician: Dr. Shaune Pollack, MD    First Contact: Dr. Randa Lynn- Mordecai Maes  Pager: [phone]  Second Contact: Dr. Eulah Pont  Pager: [phone]       After Hours (After 5p/  First Contact Pager: [phone]  weekends / holidays): Second Contact Pager: [phone]   Chief Complaint: altered mental status    History of Present Illness: altered mental status   Ms. Hurrell is a 52 year old woman with PMH severe major depression, multiple sclerosis with neurogenic bladder with chronic folley, hypertension and diabetes who presents from a nursing facility with altered mental status. She was recently admit 11/6 - 1/20 for exacerbation of multiple sclerosis and major depressive disorder with suicidal ideations. The MS flair had flaired to the point where she was not able to ambulate and had visual impairment. She was transferred from an outside hospital to [facility] for plasmapheresis therapy which was followed by a 5 day course of solumedrol and Rituximab was started for maintenance therapy.  Her depression was treated with Remeron and cymbalta during this admission.   Report from her facility are that she was speaking with her physician this morning when suddenly she stopped talking and collapsed. EMS was called and she was brought to the ED. Initially in the ED vitals were significant for hypotension ( 92/66), she was afebrile with HR 70-90 and sating at 100% on room air. She was responsive only to sternal rub when evaluated by the ED provider, she was taken for head CT and when she returned she was crying out and telling her nurse that she was having leg pain. Two hours later she was found resting calmly with family at bedside, a CBG . Two hours from then when I went to the room  to admit her she was minimally moving extremities to painful stimulus, pupils were sluggish and there was no family at bedside.  Her son notes that he spoke with her yesterday afternoon and she sounded like her usual self.   Labs in the ED were notable for blood glucose 239, crt 2.8 ( up from 1.2 at the time of discharge after last admission), lactic acid 2.0 > 1.6, VBG with pH 7.34, CO2 48, Na 140, K 3.6, Hgb 13.6 Urinalysis revealed glucosuria without ketones, small amount of leukocytes with many bacteria, and moderate hemoglobin without RBCs. Blood cultures and urine cultures were collected and she was admit to IMTS. Ms. Nagi was not verbally responsive at the time of admission, she was moving extremities to painful stimulus so this HPI was obtained from chart review and ED reports.   Meds:  Current Meds  Medication Sig  . acetaminophen (TYLENOL) 325 MG tablet Take 650 mg by mouth every 6 (six) hours as needed for mild pain or fever.  Marland Kitchen amLODipine (NORVASC) 10 MG tablet Take 1 tablet (10 mg total) by mouth daily.  Marland Kitchen aspirin EC 81 MG tablet Take 1 tablet (81 mg total) by mouth daily.  Marland Kitchen atorvastatin (LIPITOR) 40 MG tablet Take 1 tablet (40 mg total) by mouth daily.  . baclofen 5 MG TABS Take 5 mg by mouth 3 (three)  times daily.  . bethanechol (URECHOLINE) 50 MG tablet Take 1 tablet (50 mg total) by mouth 3 (three) times daily.  . 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 The patient tests 4 times per day.  . DULoxetine (CYMBALTA) 60 MG capsule Take 1 capsule (60 mg total) daily by mouth.  . feeding supplement, GLUCERNA SHAKE, (GLUCERNA SHAKE) LIQD Take 237 mLs by mouth 2 (two) times daily between meals.  Marland Kitchen GLUCAGON HCL, RDNA, IJ Inject 1 mg into the muscle as needed (hypoglycemia-administer for CBG <60).  . heparin 5000 UNIT/ML injection Inject 5,000 Units into the skin daily.  . insulin aspart (NOVOLOG) 100 UNIT/ML  injection Inject 8 Units 3 (three) times daily with meals into the skin. (Patient taking differently: Inject 8 Units into the skin See admin instructions. Pt uses 8 units subcutaneously three times daily before meals - [place] also uses sliding scale as needed throughout the day if 250-299=2 units, 300-349=3 units, 350-400=4 units >400 call MD)  . insulin glargine (LANTUS) 100 UNIT/ML injection Inject 0.3 mLs (30 Units total) into the skin at bedtime. (Patient taking differently: Inject 36 Units into the skin at bedtime. )  . lisinopril (PRINIVIL,ZESTRIL) 20 MG tablet Take 1 tablet (20 mg total) by mouth daily.  . magnesium hydroxide (MILK OF MAGNESIA) 400 MG/5ML suspension Take 30 mLs by mouth daily as needed for mild constipation. (Patient taking differently: Take 2,400 mLs by mouth every 3 (three) days as needed for mild constipation. )  . metoprolol succinate (TOPROL-XL) 100 MG 24 hr tablet Take 1 tablet (100 mg total) by mouth daily. Take with or immediately following a meal.  . mirtazapine (REMERON) 45 MG tablet Take 1 tablet (45 mg total) by mouth at bedtime.  . ondansetron (ZOFRAN) 4 MG tablet Take 4 mg by mouth daily.  . polyethylene glycol (MIRALAX / GLYCOLAX) packet Take 17 g by mouth daily as needed for mild constipation, moderate constipation or severe constipation.  . pregabalin (LYRICA) 100 MG capsule Take 1 capsule (100 mg total) by mouth 3 (three) times daily.  . traMADol (ULTRAM) 50 MG tablet Take 1 tablet (50 mg total) by mouth every 6 (six) hours as needed for moderate pain or severe pain.  . Vitamin D, Ergocalciferol, (DRISDOL) 50000 units CAPS capsule Take 1 capsule (50,000 Units total) by mouth every 7 (seven) days.     Allergies: Allergies as of 03/14/2017 - Review Complete 03/14/2017  Allergen Reaction Noted  . Penicillins Anaphylaxis, Nausea And Vomiting, and Rash 07/06/2010  . Pollen extract Other (See Comments) 11/15/2016  . Tape Rash 02/24/2016   Past Medical  History:  Diagnosis Date  . Adenomatous colonic polyps   . Anemia    2005  . Anxiety    1990  . Arthritis   . Asthma    2000  . Cataract   . Depression with psychotic symptoms   . Difficult intubation    narrow airway  . Gastroesophageal Reflux Disease (GERD)   . Heart murmur    Birth  . Hyperlipidemia    2005  . Hypertension    1998  . Internal hemorrhoids 04/24/06   on colonoscopy  . Migraine   . Multiple sclerosis (HCC)   . Neuropathic bladder   . Neuropathy of the hands & feet   . Restless Leg Syndrome   . Right Ankle Fracture 10/06/2013  . Sleep paralysis   . Stable angina pectoris  2007: cath showing normal cors.   . Stroke 1990  . Type I Diabetes Mellitus 1988   Family History: ( obtained from chart review )  Family History  Problem Relation Age of Onset  . Hypertension Mother   . Kidney disease Mother   . Hypertension Father   . Breast cancer Maternal Grandmother   . Prostate cancer Maternal Grandfather   . Ovarian cancer Paternal Grandmother   . Prostate cancer Paternal Grandfather   . Colon cancer Maternal Uncle        Family history of malignant neoplasm of gastrointestinal tract   Social History: ( obtained from chart review )  Never smoker, never drinker, no current illicit substance use, unemployed   Review of Systems: A complete ROS was negative except as per HPI.   Physical Exam: Blood pressure 103/60, pulse 76, temperature 99.5 F (37.5 C), temperature source Rectal, resp. rate 12, SpO2 100 %. General: appears stated age, snoring  Cardiac: RRR, no murmur appreciated, no peripheral edema, JVD not appreciated  Pulm: lungs clear to auscultation over bilateral anterior lung fields, no respiratory distress  Neuro: initially snoring with eyes closed, does not open eyes or move to voice, she stops snoring with sternal rub and she moves upper extremities somewhat, withdraws upper extremity with painful stimulus, withdraws lower extremities to  painful stimulus, + babinski left foot, - babinski right foot,  Pupils equal round and sluggishly reactive to light, no obvious tongue laceration, when dropped from a hight she does not gradually lower her arm but she does keep it from arm from hitting her body Abdomen: BS+, soft, non tender, non distended   EKG: personally reviewed my interpretation is sinus rhythm no acute ST abnormalities   CXR: personally reviewed my interpretation is no acute cardiopulmonary effort, poor inspiratory effort, left heart border and LLL difficult to evaluate, calcification of the first ribs  Assessment & Plan by Problem: Active Problems:   Aortic atherosclerosis (HCC)   Altered mental status   Urinary tract infection  Altered mental status   52 year old woman with history of MS, neurogenic bladder, diabetes, depression with catatonia presents with acute change in mental status found to be hypotensive with AKI, leukocytosis and many bacteria on urinalysis and CT with possible inflammation of the bladder. CT head showed no acute abnormality and MRI showed progression of MS without acute abnormality. Cause of AMS is still unclear at this point. Will treat for UTI and start workup for metabolic encephalopathy and seizure. She has a Hx of klebsiella UTI which was resistant to penicillin but sensitive to cephalosporins however she has a history of reaction to penicillin with signs of anaphylaxis limiting the use of cephalosporins.  use of cephalosporin abx.     - try narcan given sluggish pupillary response  - Checking B12, TSH, Mag, and RPR for toxic metabolic causes of encephalopathy  - Neuro consulted for EEG, appreciate their help in her care   - follow up blood cultures  - follow up MRI   Multiple sclerosis   During recent admission she was started on rituxan for maintenance. Plan was for follow up with Dr. Epimenio Foot after discharge from a psychiatric hospital for a second infusion however there are no records of  this follow up in epic. MRI on admission showed progression of corpus callosum lesions and more abnormal signals in the cerebellar peduncles consistent with demyelinating disease.  - will attempt to obtain outside records   Urinary tract infection  - change  foley catheter  - follow up urine cultures  - continue Aztreonam  Hemoglobinuria  Urinalysis with moderate hemoglobinuria without RBCs in the setting of AKI and altered mental status.  - Follow up CK    AKI  Crt 2.8 up from 1.2 at the time of discharge 3 weeks ago and GFR is 19 down from 52 at that time. BUN/crt ratio 13 is more consistent with obstruction. She has a chronic indwelling foley cath. She received 2 liters NS in the ED and has only 300 ml of urine in the foley bag. The CT abdomen comments that the bladder is underdistended there is no hydronephrosis but she has a thick sediment in the foley bag.  - change foley cath  - follow up morning BMP   Dispo: Admit patient to Inpatient with expected length of stay greater than 2 midnights.  Signed: Eulah Pont, MD 03/14/2017, 6:19 PM  Pager: [phone]

## 2017-03-14 NOTE — ED Notes (Signed)
Pt to CT

## 2017-03-15 ENCOUNTER — Other Ambulatory Visit: Payer: Self-pay

## 2017-03-15 ENCOUNTER — Encounter (HOSPITAL_COMMUNITY): Payer: Self-pay | Admitting: *Deleted

## 2017-03-15 DIAGNOSIS — R823 Hemoglobinuria: Secondary | ICD-10-CM

## 2017-03-15 DIAGNOSIS — B9689 Other specified bacterial agents as the cause of diseases classified elsewhere: Secondary | ICD-10-CM

## 2017-03-15 DIAGNOSIS — R32 Unspecified urinary incontinence: Secondary | ICD-10-CM

## 2017-03-15 DIAGNOSIS — I10 Essential (primary) hypertension: Secondary | ICD-10-CM

## 2017-03-15 DIAGNOSIS — N390 Urinary tract infection, site not specified: Secondary | ICD-10-CM

## 2017-03-15 DIAGNOSIS — N319 Neuromuscular dysfunction of bladder, unspecified: Secondary | ICD-10-CM

## 2017-03-15 DIAGNOSIS — Z960 Presence of urogenital implants: Secondary | ICD-10-CM

## 2017-03-15 DIAGNOSIS — Z87440 Personal history of urinary (tract) infections: Secondary | ICD-10-CM

## 2017-03-15 DIAGNOSIS — F329 Major depressive disorder, single episode, unspecified: Secondary | ICD-10-CM

## 2017-03-15 DIAGNOSIS — G35 Multiple sclerosis: Secondary | ICD-10-CM

## 2017-03-15 DIAGNOSIS — E109 Type 1 diabetes mellitus without complications: Secondary | ICD-10-CM

## 2017-03-15 DIAGNOSIS — R159 Full incontinence of feces: Secondary | ICD-10-CM

## 2017-03-15 LAB — CBC
HEMATOCRIT: 34.5 % — AB (ref 36.0–46.0)
HEMOGLOBIN: 11.4 g/dL — AB (ref 12.0–15.0)
MCH: 29.5 pg (ref 26.0–34.0)
MCHC: 33 g/dL (ref 30.0–36.0)
MCV: 89.1 fL (ref 78.0–100.0)
Platelets: 198 10*3/uL (ref 150–400)
RBC: 3.87 MIL/uL (ref 3.87–5.11)
RDW: 13.9 % (ref 11.5–15.5)
WBC: 9.6 10*3/uL (ref 4.0–10.5)

## 2017-03-15 LAB — BASIC METABOLIC PANEL
ANION GAP: 12 (ref 5–15)
BUN: 34 mg/dL — ABNORMAL HIGH (ref 6–20)
CO2: 21 mmol/L — ABNORMAL LOW (ref 22–32)
Calcium: 9.4 mg/dL (ref 8.9–10.3)
Chloride: 109 mmol/L (ref 101–111)
Creatinine, Ser: 1.76 mg/dL — ABNORMAL HIGH (ref 0.44–1.00)
GFR calc Af Amer: 38 mL/min — ABNORMAL LOW (ref >60)
GFR, EST NON AFRICAN AMERICAN: 33 mL/min — AB (ref >60)
Glucose, Bld: 250 mg/dL — ABNORMAL HIGH (ref 65–99)
Potassium: 3.6 mmol/L (ref 3.5–5.1)
SODIUM: 142 mmol/L (ref 135–145)

## 2017-03-15 LAB — CBG MONITORING, ED: Glucose-Capillary: 165 mg/dL — ABNORMAL HIGH (ref 65–99)

## 2017-03-15 LAB — MRSA PCR SCREENING: MRSA by PCR: NEGATIVE

## 2017-03-15 LAB — RPR: RPR Ser Ql: NONREACTIVE

## 2017-03-15 LAB — RAPID URINE DRUG SCREEN, HOSP PERFORMED
Amphetamines: NOT DETECTED
BARBITURATES: NOT DETECTED
Benzodiazepines: NOT DETECTED
COCAINE: NOT DETECTED
Opiates: NOT DETECTED
Tetrahydrocannabinol: NOT DETECTED

## 2017-03-15 LAB — CK: Total CK: 794 U/L — ABNORMAL HIGH (ref 38–234)

## 2017-03-15 LAB — VITAMIN B12: VITAMIN B 12: 971 pg/mL — AB (ref 180–914)

## 2017-03-15 LAB — GLUCOSE, CAPILLARY
Glucose-Capillary: 282 mg/dL — ABNORMAL HIGH (ref 65–99)
Glucose-Capillary: 318 mg/dL — ABNORMAL HIGH (ref 65–99)

## 2017-03-15 LAB — AMMONIA: AMMONIA: 21 umol/L (ref 9–35)

## 2017-03-15 LAB — TSH: TSH: 0.866 u[IU]/mL (ref 0.350–4.500)

## 2017-03-15 LAB — MAGNESIUM: MAGNESIUM: 1.9 mg/dL (ref 1.7–2.4)

## 2017-03-15 MED ORDER — INSULIN ASPART 100 UNIT/ML ~~LOC~~ SOLN
0.0000 [IU] | Freq: Three times a day (TID) | SUBCUTANEOUS | Status: DC
  Administered 2017-03-16: 5 [IU] via SUBCUTANEOUS
  Administered 2017-03-16: 3 [IU] via SUBCUTANEOUS

## 2017-03-15 MED ORDER — AZTREONAM 1 G IJ SOLR
1.0000 g | Freq: Three times a day (TID) | INTRAMUSCULAR | Status: DC
  Administered 2017-03-15 – 2017-03-16 (×2): 1 g via INTRAVENOUS
  Filled 2017-03-15 (×3): qty 1

## 2017-03-15 MED ORDER — HYDRALAZINE HCL 20 MG/ML IJ SOLN: 10.0000 mg | INTRAMUSCULAR | Status: DC | PRN

## 2017-03-15 MED ORDER — PREGABALIN 100 MG PO CAPS
100.0000 mg | ORAL_CAPSULE | Freq: Three times a day (TID) | ORAL | Status: DC
  Administered 2017-03-15 – 2017-03-16 (×3): 100 mg via ORAL
  Filled 2017-03-15 (×3): qty 1

## 2017-03-15 MED ORDER — GABAPENTIN 100 MG PO CAPS
100.0000 mg | ORAL_CAPSULE | Freq: Three times a day (TID) | ORAL | Status: DC
  Administered 2017-03-15 – 2017-03-16 (×3): 100 mg via ORAL
  Filled 2017-03-15 (×3): qty 1

## 2017-03-15 MED ORDER — HYDRALAZINE HCL 20 MG/ML IJ SOLN
INTRAMUSCULAR | Status: AC
  Filled 2017-03-15: qty 1

## 2017-03-15 MED ORDER — TRAMADOL HCL 50 MG PO TABS
50.0000 mg | ORAL_TABLET | Freq: Four times a day (QID) | ORAL | Status: DC | PRN
  Administered 2017-03-15 – 2017-03-16 (×2): 50 mg via ORAL
  Filled 2017-03-15 (×2): qty 1

## 2017-03-15 MED ORDER — INSULIN GLARGINE 100 UNIT/ML ~~LOC~~ SOLN
15.0000 [IU] | Freq: Every day | SUBCUTANEOUS | Status: DC
  Administered 2017-03-15: 15 [IU] via SUBCUTANEOUS
  Filled 2017-03-15 (×2): qty 0.15

## 2017-03-15 MED ORDER — HYDRALAZINE HCL 20 MG/ML IJ SOLN: 10.0000 mg | Freq: Once | INTRAMUSCULAR | Status: DC

## 2017-03-15 MED ORDER — HYDRALAZINE HCL 25 MG PO TABS
25.0000 mg | ORAL_TABLET | Freq: Three times a day (TID) | ORAL | Status: DC | PRN
  Administered 2017-03-15 – 2017-03-16 (×3): 25 mg via ORAL
  Filled 2017-03-15 (×3): qty 1

## 2017-03-15 MED ORDER — AMLODIPINE BESYLATE 10 MG PO TABS
10.0000 mg | ORAL_TABLET | Freq: Every day | ORAL | Status: DC
  Administered 2017-03-15 – 2017-03-16 (×2): 10 mg via ORAL
  Filled 2017-03-15 (×2): qty 1

## 2017-03-15 NOTE — Progress Notes (Signed)
Subjective: Doing better on IV ABX and IVF. Patient does not recall events during the initial portion of her ED admission.  Objective: Current vital signs: BP (!) 176/86   Pulse 92   Temp 98.9 F (37.2 C)   Resp 20   SpO2 100%  Vital signs in last 24 hours: Temp:  [98.9 F (37.2 C)-99.5 F (37.5 C)] 98.9 F (37.2 C) (01/17 0747) Pulse Rate:  [30-94] 92 (01/17 0700) Resp:  [0-22] 20 (01/17 0700) BP: (89-183)/(57-135) 176/86 (01/17 0700) SpO2:  [48 %-100 %] 100 % (01/17 0700)  Intake/Output from previous day: 01/16 0701 - 01/17 0700 In: 2100 [IV Piggyback:2100] Out: 300 [Urine:300] Intake/Output this shift: Total I/O In: 50 [IV Piggyback:50] Out: -  Nutritional status: Diet NPO time specified  General Exam: HEENT: [place]/AT Ext: Warm and well-perfused  Neurologic Exam: Ment: Drowsiness has improved almost to baseline. She follows all commands and converses normally with fluent speech.  CN: Fixates and tracks normally. No nystagmus. No facial droop noted.  Motor: Poor effort. In this context there is 4/5 strength in upper extremities and 2/5 strength in lower extremities bilaterally.  Lab Results: Results for orders placed or performed during the hospital encounter of 03/14/17 (from the past 48 hour(s))  CBG monitoring, ED     Status: Abnormal   Collection Time: 03/14/17 11:04 AM  Result Value Ref Range   Glucose-Capillary 232 (H) 65 - 99 mg/dL   Comment 1 Notify RN    Comment 2 Document in Chart   CBC with Differential     Status: Abnormal   Collection Time: 03/14/17 11:53 AM  Result Value Ref Range   WBC 10.8 (H) 4.0 - 10.5 K/uL   RBC 4.39 3.87 - 5.11 MIL/uL   Hemoglobin 12.9 12.0 - 15.0 g/dL   HCT 39.1 36.0 - 46.0 %   MCV 89.1 78.0 - 100.0 fL   MCH 29.4 26.0 - 34.0 pg   MCHC 33.0 30.0 - 36.0 g/dL   RDW 13.9 11.5 - 15.5 %   Platelets 286 150 - 400 K/uL   Neutrophils Relative % 70 %   Neutro Abs 7.7 1.7 - 7.7 K/uL   Lymphocytes Relative 19 %   Lymphs Abs 2.0  0.7 - 4.0 K/uL   Monocytes Relative 9 %   Monocytes Absolute 1.0 0.1 - 1.0 K/uL   Eosinophils Relative 2 %   Eosinophils Absolute 0.2 0.0 - 0.7 K/uL   Basophils Relative 0 %   Basophils Absolute 0.0 0.0 - 0.1 K/uL  Comprehensive metabolic panel     Status: Abnormal   Collection Time: 03/14/17 11:53 AM  Result Value Ref Range   Sodium 141 135 - 145 mmol/L   Potassium 3.6 3.5 - 5.1 mmol/L   Chloride 104 101 - 111 mmol/L   CO2 24 22 - 32 mmol/L   Glucose, Bld 239 (H) 65 - 99 mg/dL   BUN 37 (H) 6 - 20 mg/dL   Creatinine, Ser 2.80 (H) 0.44 - 1.00 mg/dL   Calcium 10.0 8.9 - 10.3 mg/dL   Total Protein 7.1 6.5 - 8.1 g/dL   Albumin 3.8 3.5 - 5.0 g/dL   AST 24 15 - 41 U/L   ALT 73 (H) 14 - 54 U/L   Alkaline Phosphatase 113 38 - 126 U/L   Total Bilirubin 1.0 0.3 - 1.2 mg/dL   GFR calc non Af Amer 19 (L) >60 mL/min   GFR calc Af Amer 22 (L) >60 mL/min    Comment: (  NOTE) The eGFR has been calculated using the CKD EPI equation. This calculation has not been validated in all clinical situations. eGFR's persistently <60 mL/min signify possible Chronic Kidney Disease.    Anion gap 13 5 - 15  Blood culture (routine x 2)     Status: None (Preliminary result)   Collection Time: 03/14/17 12:00 PM  Result Value Ref Range   Specimen Description BLOOD RIGHT ANTECUBITAL    Special Requests      BOTTLES DRAWN AEROBIC AND ANAEROBIC Blood Culture adequate volume   Culture NO GROWTH < 24 HOURS    Report Status PENDING   Urinalysis, Routine w reflex microscopic     Status: Abnormal   Collection Time: 03/14/17 12:06 PM  Result Value Ref Range   Color, Urine YELLOW YELLOW   APPearance TURBID (A) CLEAR   Specific Gravity, Urine 1.015 1.005 - 1.030   pH 5.0 5.0 - 8.0   Glucose, UA >=500 (A) NEGATIVE mg/dL   Hgb urine dipstick MODERATE (A) NEGATIVE   Bilirubin Urine NEGATIVE NEGATIVE   Ketones, ur NEGATIVE NEGATIVE mg/dL   Protein, ur 100 (A) NEGATIVE mg/dL   Nitrite NEGATIVE NEGATIVE    Leukocytes, UA MODERATE (A) NEGATIVE   RBC / HPF 0-5 0 - 5 RBC/hpf   WBC, UA TOO NUMEROUS TO COUNT 0 - 5 WBC/hpf   Bacteria, UA MANY (A) NONE SEEN   Squamous Epithelial / LPF NONE SEEN NONE SEEN   WBC Clumps PRESENT    Budding Yeast PRESENT   I-Stat CG4 Lactic Acid, ED     Status: Abnormal   Collection Time: 03/14/17 12:22 PM  Result Value Ref Range   Lactic Acid, Venous 2.03 (HH) 0.5 - 1.9 mmol/L   Comment NOTIFIED PHYSICIAN   I-Stat Venous Blood Gas, ED (order at [facility] and [facility] only)     Status: None   Collection Time: 03/14/17 12:22 PM  Result Value Ref Range   pH, Ven 7.348 7.250 - 7.430   pCO2, Ven 48.9 44.0 - 60.0 mmHg   pO2, Ven 42.0 32.0 - 45.0 mmHg   Bicarbonate 26.9 20.0 - 28.0 mmol/L   TCO2 28 22 - 32 mmol/L   O2 Saturation 74.0 %   Acid-Base Excess 1.0 0.0 - 2.0 mmol/L   Patient temperature HIDE    Sample type VENOUS   I-Stat Chem 8, ED     Status: Abnormal   Collection Time: 03/14/17 12:22 PM  Result Value Ref Range   Sodium 140 135 - 145 mmol/L   Potassium 3.6 3.5 - 5.1 mmol/L   Chloride 102 101 - 111 mmol/L   BUN 38 (H) 6 - 20 mg/dL   Creatinine, Ser 2.80 (H) 0.44 - 1.00 mg/dL   Glucose, Bld 235 (H) 65 - 99 mg/dL   Calcium, Ion 1.26 1.15 - 1.40 mmol/L   TCO2 27 22 - 32 mmol/L   Hemoglobin 13.6 12.0 - 15.0 g/dL   HCT 40.0 36.0 - 46.0 %  Blood culture (routine x 2)     Status: None (Preliminary result)   Collection Time: 03/14/17  1:09 PM  Result Value Ref Range   Specimen Description BLOOD LEFT ANTECUBITAL    Special Requests      BOTTLES DRAWN AEROBIC AND ANAEROBIC Blood Culture adequate volume   Culture NO GROWTH < 24 HOURS    Report Status PENDING   I-Stat CG4 Lactic Acid, ED     Status: None   Collection Time: 03/14/17  1:15 PM  Result Value  Ref Range   Lactic Acid, Venous 1.60 0.5 - 1.9 mmol/L  CBG monitoring, ED     Status: Abnormal   Collection Time: 03/14/17  4:34 PM  Result Value Ref Range   Glucose-Capillary 257 (H) 65 - 99 mg/dL  CK      Status: Abnormal   Collection Time: 03/15/17 12:27 AM  Result Value Ref Range   Total CK 794 (H) 38 - 234 U/L  Magnesium     Status: None   Collection Time: 03/15/17 12:27 AM  Result Value Ref Range   Magnesium 1.9 1.7 - 2.4 mg/dL  TSH     Status: None   Collection Time: 03/15/17 12:27 AM  Result Value Ref Range   TSH 0.866 0.350 - 4.500 uIU/mL    Comment: Performed by a 3rd Generation assay with a functional sensitivity of <=0.01 uIU/mL.  Vitamin B12     Status: Abnormal   Collection Time: 03/15/17 12:27 AM  Result Value Ref Range   Vitamin B-12 971 (H) 180 - 914 pg/mL    Comment: (NOTE) This assay is not validated for testing neonatal or myeloproliferative syndrome specimens for Vitamin B12 levels.   Basic metabolic panel     Status: Abnormal   Collection Time: 03/15/17  4:23 AM  Result Value Ref Range   Sodium 142 135 - 145 mmol/L   Potassium 3.6 3.5 - 5.1 mmol/L   Chloride 109 101 - 111 mmol/L   CO2 21 (L) 22 - 32 mmol/L   Glucose, Bld 250 (H) 65 - 99 mg/dL   BUN 34 (H) 6 - 20 mg/dL   Creatinine, Ser 1.76 (H) 0.44 - 1.00 mg/dL    Comment: DELTA CHECK NOTED   Calcium 9.4 8.9 - 10.3 mg/dL   GFR calc non Af Amer 33 (L) >60 mL/min   GFR calc Af Amer 38 (L) >60 mL/min    Comment: (NOTE) The eGFR has been calculated using the CKD EPI equation. This calculation has not been validated in all clinical situations. eGFR's persistently <60 mL/min signify possible Chronic Kidney Disease.    Anion gap 12 5 - 15  CBC     Status: Abnormal   Collection Time: 03/15/17  4:23 AM  Result Value Ref Range   WBC 9.6 4.0 - 10.5 K/uL   RBC 3.87 3.87 - 5.11 MIL/uL   Hemoglobin 11.4 (L) 12.0 - 15.0 g/dL   HCT 34.5 (L) 36.0 - 46.0 %   MCV 89.1 78.0 - 100.0 fL   MCH 29.5 26.0 - 34.0 pg   MCHC 33.0 30.0 - 36.0 g/dL   RDW 13.9 11.5 - 15.5 %   Platelets 198 150 - 400 K/uL  Ammonia     Status: None   Collection Time: 03/15/17  5:28 AM  Result Value Ref Range   Ammonia 21 9 - 35 umol/L     Recent Results (from the past 240 hour(s))  Blood culture (routine x 2)     Status: None (Preliminary result)   Collection Time: 03/14/17 12:00 PM  Result Value Ref Range Status   Specimen Description BLOOD RIGHT ANTECUBITAL  Final   Special Requests   Final    BOTTLES DRAWN AEROBIC AND ANAEROBIC Blood Culture adequate volume   Culture NO GROWTH < 24 HOURS  Final   Report Status PENDING  Incomplete  Blood culture (routine x 2)     Status: None (Preliminary result)   Collection Time: 03/14/17  1:09 PM  Result Value Ref Range Status  Specimen Description BLOOD LEFT ANTECUBITAL  Final   Special Requests   Final    BOTTLES DRAWN AEROBIC AND ANAEROBIC Blood Culture adequate volume   Culture NO GROWTH < 24 HOURS  Final   Report Status PENDING  Incomplete    Lipid Panel No results for input(s): CHOL, TRIG, HDL, CHOLHDL, VLDL, LDLCALC in the last 72 hours.  Studies/Results: Ct Abdomen Pelvis Wo Contrast  Result Date: 03/14/2017 CLINICAL DATA:  Nausea and vomiting. Syncope. Abdominal distention. EXAM: CT ABDOMEN AND PELVIS WITHOUT CONTRAST TECHNIQUE: Multidetector CT imaging of the abdomen and pelvis was performed following the standard protocol without IV contrast. COMPARISON:  CT abdomen pelvis 11/15/2016 FINDINGS: The examination is degraded by motion. Lower chest: No pulmonary nodules or pleural effusion. No visible pericardial effusion. Hepatobiliary: Normal hepatic contours and density. No visible biliary dilatation. Status post cholecystectomy. Pancreas: Normal contours without ductal dilatation. No peripancreatic fluid collection. Spleen: Normal. Adrenals/Urinary Tract: --Adrenal glands: Normal. --Right kidney/ureter: No hydronephrosis or perinephric stranding. No nephrolithiasis. No obstructing ureteral stones. --Left kidney/ureter: No hydronephrosis or perinephric stranding. No nephrolithiasis. No obstructing ureteral stones. --Urinary bladder: The bladder wall is thickened. There is  an indwelling Foley catheter. Stomach/Bowel: --Stomach/Duodenum: No hiatal hernia or other gastric abnormality. Normal duodenal course and caliber. --Small bowel: No dilatation or inflammation. --Colon: No focal abnormality. --Appendix: Normal. Vascular/Lymphatic: Atherosclerotic calcification is present within the non-aneurysmal abdominal aorta, without hemodynamically significant stenosis. No abdominal or pelvic lymphadenopathy. Reproductive: Status post tubal ligation.  Normal uterus. Musculoskeletal. No bony spinal canal stenosis or focal osseous abnormality. Other: Multiple injection granulomata within the anterior subcutaneous fat. IMPRESSION: 1. Motion degraded examination. 2. Bladder wall thickening may be due to underdistention, though cystitis may also have this appearance. 3.  Aortic Atherosclerosis (ICD10-I70.0). Electronically Signed   By: Ulyses Jarred M.D.   On: 03/14/2017 15:36   Ct Head Wo Contrast  Result Date: 03/14/2017 CLINICAL DATA:  Acute onset loss of consciousness today. The patient is unresponsive. History of multiple sclerosis. EXAM: CT HEAD WITHOUT CONTRAST TECHNIQUE: Contiguous axial images were obtained from the base of the skull through the vertex without intravenous contrast. COMPARISON:  Head CT scan 10/02/2013.  Brain MRI 12/31/2016. FINDINGS: Brain: Scattered areas of hypoattenuation in the periventricular deep white matter consistent with the patient's history of multiple sclerosis are identified. No evidence of acute abnormality including hemorrhage, infarct, mass lesion, mass effect, midline shift or abnormal extra-axial fluid collection is seen. There is no hydrocephalus or pneumocephalus. Vascular: Atherosclerosis noted. Skull: Intact. Sinuses/Orbits: Mucosal thickening and wall thickening in the right maxillary sinus are again seen. There is also mucosal thickening in scattered ethmoid air cells, the sphenoid sinuses and left frontal sinus. Ethmoid air cell disease is  improved since the prior CT scan. Other: None. IMPRESSION: No acute abnormality. Findings consistent with the patient's history of multiple sclerosis. Chronic sinus disease. Electronically Signed   By: Inge Rise M.D.   On: 03/14/2017 11:37   Mr Brain Wo Contrast  Result Date: 03/14/2017 CLINICAL DATA:  History of multiple sclerosis. Altered mental status. Confusion. Poorly responsive. EXAM: MRI HEAD WITHOUT CONTRAST TECHNIQUE: Multiplanar, multiecho pulse sequences of the brain and surrounding structures were obtained without intravenous contrast. COMPARISON:  CT same day.  MRI 12/31/2016 FINDINGS: Brain: Since the previous study, there has been enlargement of the lesion within the right side of the splenium of the corpus callosum. I think there is also more abnormal signal within the middle cerebellar peduncles and within the body of the corpus  callosum. No other progressive change. Multiple other foci of T2 and FLAIR signal within the brainstem and hemispheric deep white matter appear similar in number and size. There is resolution of restricted diffusion of the frontal lesions seen on the previous study. No new lesions show restricted diffusion. No sign of hemorrhage, hydrocephalus or extra-axial collection. Vascular: Major vessels at the base of the brain show flow. Skull and upper cervical spine: Negative Sinuses/Orbits: Redemonstration of mucosal inflammatory changes of the paranasal sinuses. Orbits negative. Other: None IMPRESSION: Multiple lesions of the corpus callosum are progressive since the previous study. I also think there is more abnormal signal within the middle cerebellar peduncles. This is consistent with progression of the demyelinating disease. Other supratentorial white matter lesions do not appear progressive. In fact, previously seen foci in the frontal lobes which showed restricted diffusion no longer do so. Electronically Signed   By: Nelson Chimes M.D.   On: 03/14/2017 20:45    Dg Chest Portable 1 View  Result Date: 03/14/2017 CLINICAL DATA:  Altered mental status EXAM: PORTABLE CHEST 1 VIEW COMPARISON:  Chest x-ray of 01/10/2017 FINDINGS: No definite pneumonia or pleural effusion is seen. Mediastinal and hilar contours appear stable and cardiomegaly is stable. No acute bony abnormality is noted. IMPRESSION: No active lung disease.  Stable cardiomegaly. Electronically Signed   By: Ivar Drape M.D.   On: 03/14/2017 12:33   Dg Hip Port Unilat With Pelvis 1v Right  Result Date: 03/14/2017 CLINICAL DATA:  Right hip pain. Syncopal episode in a chair. No fall. EXAM: DG HIP (WITH OR WITHOUT PELVIS) 1V PORT RIGHT COMPARISON:  Bilateral hip x-rays dated November 16, 2016. FINDINGS: There is no evidence of hip fracture or dislocation. There is no evidence of arthropathy or other focal bone abnormality. Prior tubal ligation. Multiple phleboliths in the pelvis are unchanged. IMPRESSION: Negative. Electronically Signed   By: Titus Dubin M.D.   On: 03/14/2017 15:05    Home Medications:  Home meds include Rituximab, risperidone, Ambien, Neurontin, heparin sq, mirtazapine and insulin. Additonal meds on list noted and fully reviewed  Scheduled inpatient medications:: . aspirin EC  81 mg Oral Daily  . atorvastatin  40 mg Oral Daily  . baclofen  5 mg Oral TID  . DULoxetine  60 mg Oral Daily  . heparin  5,000 Units Subcutaneous Q8H  Aztreonam  Continuous: . sodium chloride 100 mL/hr at 03/14/17 1820  . valproate sodium Stopped (03/15/17 0747)    Assessment/Recommendations:  52 y.o. female with Multiple sclerosis, depression, DM and HTN, presents with 1 day history of AMS and witnessed episode where patient became suddenly unresponsive and hypoxic. On arrival patient was hypotensive, had mild leukocytosis and UA suggestive of UTI. Also fund to have AKI. Patient started on antibiotics and admitted to medicine team. Delirium is resolving as of AM exam today.  1. After initial  evaluation, the patient was loaded with valproic acid for possible seizure, although delirium as the etiology for her unresponsive episode is now felt to be more likely.  LTM EEG preliminary review reveals no electrographic seizure. Exam reveals no evidence for clinical seizure activity. Will consider discontinuation of valproic acid if LTM EEG is negative and patient's mentation continues to improve.  2. Aggressive Multiple Sclerosis, on Rituxan as outpatient. Given her infection, Rituxan may not be an optimal disease modifying agent at this point in time. Will consider discharging her on Copaxone. Will need to discuss with her Neuroimmunologist, Dr. Felecia Shelling. MRI Brain w/o contrast ordered: New  lesions are seen relative to her prior MRI Brain in November, but some improvement regarding previously seen lesions is also noted.  3. Metabolic/infectious encephalopathy, resolving.  4. Possible nonconvulsive seizures - preliminary review of EEG by Dr. Lorraine Lax was negative for NCSE. On my subsequent review, there appears to be no electrographic seizure activity. Awaiting official LTM EEG report.  5. Acute Kidney injury. Continue IV fluids, management of acute kidney injury  6. Urinary tract infection. On aztreonam. Avoid Cefepime/Ceftriaxone if possible.  7. Ammonia level normal. TSH normal.    LOS: 1 day   '@Electronically'$  signed: Dr. Kerney Elbe 03/15/2017  10:32 AM

## 2017-03-15 NOTE — Progress Notes (Signed)
Subjective:  No acute events overnight.  Patient very upset and angry this morning.  Stated she is "tired to answer stupid questions." Explained to patient she has a UTI that likely caused her AMS and she is being treated for it. Patient states she was told she did not have an infection and that is stupid to get a medication she does not need.   Objective:  Vital signs in last 24 hours: Vitals:   03/15/17 0530 03/15/17 0600 03/15/17 0630 03/15/17 0747  BP: (!) 166/98 (!) 152/67 (!) 172/85   Pulse: 85 80 87   Resp: 17 12 15    Temp:    98.9 F (37.2 C)  TempSrc:      SpO2: 100% 99% 100%    General: thin female with depressed affect, upset and did not want to interact with the team, lying in bed in no acute distress  Cardiac: regular rate and rhythm, nl S1/S2, no murmurs, rubs or gallops  Pulm: CTAB, no wheezes or crackles, appears comfortable on room air   Abd: soft, NTND, bowel sounds present Neuro: A&Ox3, urinary and bowel incontinence at baseline  Ext: warm and well perfused, no peripheral edema noted  Psych: answers questions appropriately, depressed affect, upset    Assessment/Plan:  Active Problems:   Aortic atherosclerosis (HCC)   Altered mental status   Urinary tract infection   Encephalopathy  Debra [name] is a 52 year old female with history of MS, neurogenic bladder, T1DM, depression with catatonia presents with acute change in mental status found to be hypotensive with AKI, leukocytosis and many bacteria on urinalysis and CT with possible inflammation of the bladder.  # Altered mental status   # UTI  CT head negative and MRI showed progression of MS without acute abnormality. This morning she is alert, awake, and able to answers questions. Etiology of acute AMS seems to be secondary to UTI . UA with moderate leukocytes, many bacteria, and TNTC WBCs. Ucx pending. Renal function improving, Scr 1.76 this morning from 2.8 yesterday. Blood cx negative at 24h.  Will  continue treatment for UTI.  She has a history of klebsiella UTI which was resistant to penicillin but sensitive to cephalosporins however she has a history of reaction to penicillin with signs of anaphylaxis limiting the use of cephalosporins. She is currently on aztreonam. Also working up for seizure. Neurology following and EEG in process. Thus far, no seizure activity observed but awaiting official report. Per neurology note, will discontinue valproic acid if EEG negative.      - Neuro following, appreciate recommendations  - F/u EEG report  - F/u RPR. VitB12 + Mag + TSH normal  - F/u Blood cx blood - Continue aztreonam pending Ucx results   # AKI  S/p foley catheter exchange 1/16. Renal function improving with SCr 1.76 this morning. UOP 300c, will speak to RN to confirm this is accurate.  - Continue to monitor UOP and renal function  - UTI treatment as above   # Progressive Multiple sclerosis   During recent admission she was started on rituxan for maintenance therapy. Plan was for follow up with Dr. Epimenio Foot after discharge from a psychiatric hospital for a second infusion however there are no records of this follow up in epic. MRI on admission showed progression of corpus callosum lesions and more abnormal signals in the cerebellar peduncles consistent with demyelinating disease. There is also mild improvement in older lesions.  - Continue baclofen   # Hemoglobinuria  Urinalysis with moderate hemoglobinuria without RBCs in the setting of AKI and altered mental status. CK 800.    Dispo: Anticipated discharge in approximately 2-3 day(s) pending ongoing treatment for UTI.   [name], MD 03/15/2017, 8:29 AM Pager: [phone]

## 2017-03-15 NOTE — ED Notes (Signed)
Pt was incontinent of large amount of stool, catheter care completed, pt drinking water without difficulty.

## 2017-03-15 NOTE — Progress Notes (Signed)
Patient arrived to 3M01 via stretcher. Patient alert and oriented. CHG bath completed. Denies pain or discomfort at this time.

## 2017-03-15 NOTE — ED Notes (Signed)
Attempting to get in touch with someone from [facility]-- left message with social worker--

## 2017-03-15 NOTE — ED Notes (Signed)
Patient called to request that her Aunt be added to her contact info. Patient states "that's the person to call if anything happens" Patient's aunt was added as requested.

## 2017-03-15 NOTE — Progress Notes (Signed)
Per RN, Dr Otelia Limes requested LTM to be D/C'd. No skin breakdown noted.

## 2017-03-15 NOTE — Progress Notes (Signed)
Results for ANTANAE, KOEPP (MRN [id number]) as of 03/15/2017 11:53  Ref. Range 03/14/2017 11:04 03/14/2017 16:34 03/15/2017 11:36  Glucose-Capillary Latest Ref Range: 65 - 99 mg/dL 917 (H) 915 (H) 056 (H)  Noted that patient does take insulin at home. Recommend starting 1/2 of Lantus home dosage of 18 units daily and Novolog SENSITIVE correction scale TID & HS if eating.  Patient does take Lantus 36 U every HS and Novolog 8 U TID with meals at home.   Smith Mince RN BSN CDE Diabetes Coordinator Pager: [phone]  8am-5pm

## 2017-03-15 NOTE — Procedures (Signed)
Continuous Video-EEG Monitoring Report     Patient:                       Debra [name], Debra [name] MRN:                           [id number] DOB:                           January 04, 1966   Study Duration:         03/14/2017 23:34 to 03/15/2017 09:18 CPT Code:                  84166(06) Diagnosis:                  Altered mental status (R41.82)   History: This is a 52 year old female presenting with altered mental status.  Continuous video-EEG monitoring was performed to evaluate for seizures.     Technical Details:  Long-term video-EEG monitoring was performed using standard setting per the guidelines.  Briefly, a minimum of 21 electrodes were placed on scalp according to the International 10-20 or/and 10-10 Systems.  Supplemental electrodes were placed as needed.  Single EKG electrode was also used to detect cardiac arrhythmia.  Patient's behavior was continuously recorded on video simultaneously with EEG.  A minimum of 16 channels were used for data display.  Each epoch of study was reviewed manually daily and as needed using standard referential and bipolar montages.     EEG Description:  There was generalized polymorphic delta and theta slowing (up to 6 Hz).  The delta slowing gradually decreased over time.  No posterior dominant rhythm or sleep architecture was seen.  No epileptiform discharge or seizure was present.     Impression:  This is an abnormal EEG due to the presence of generalized polymorphic delta and theta slowing, suggesting moderate to severe encephalopathy which is nonspecific as to etiology.  No epileptiform discharge or seizure was present.         Reading Physician: Ronnie Doss, MD, PhD

## 2017-03-15 NOTE — Progress Notes (Signed)
Patient was admitted from [facility] skilled nursing facility, where she was placed with a [employer] letter of guarantee upon discharge from [facility] on Saturday, 1/12 due to pending Medicaid/self-pay status.  According to the facility's administrator this afternoon, she is able to return to that facility upon discharge.    Debra [name] is currently scheduled to receive her next Rituximab infusion in Short Stay at [facility] at 8 am on Monday, 1/21.  We will need to reschedule this if she remains inpatient or if that schedule is not appropriate at this time.  Will ask the unit CM and CSW to follow up.  Roberta Angell, ACSW, LCSW Director, Care Management and Clinical Social Work Departments [phone]

## 2017-03-15 NOTE — ED Notes (Signed)
CALLED FOR PT DIET TRAY

## 2017-03-16 ENCOUNTER — Telehealth: Payer: Self-pay | Admitting: Neurology

## 2017-03-16 DIAGNOSIS — Z91048 Other nonmedicinal substance allergy status: Secondary | ICD-10-CM

## 2017-03-16 DIAGNOSIS — N179 Acute kidney failure, unspecified: Secondary | ICD-10-CM

## 2017-03-16 DIAGNOSIS — Z880 Allergy status to penicillin: Secondary | ICD-10-CM

## 2017-03-16 DIAGNOSIS — J301 Allergic rhinitis due to pollen: Secondary | ICD-10-CM

## 2017-03-16 DIAGNOSIS — B9620 Unspecified Escherichia coli [E. coli] as the cause of diseases classified elsewhere: Secondary | ICD-10-CM

## 2017-03-16 DIAGNOSIS — G9340 Encephalopathy, unspecified: Secondary | ICD-10-CM

## 2017-03-16 LAB — URINE CULTURE
Culture: >=100000 — AB
SPECIAL REQUESTS: NORMAL

## 2017-03-16 LAB — BASIC METABOLIC PANEL
Anion gap: 11 (ref 5–15)
BUN: 20 mg/dL (ref 6–20)
CALCIUM: 9.2 mg/dL (ref 8.9–10.3)
CHLORIDE: 103 mmol/L (ref 101–111)
CO2: 21 mmol/L — AB (ref 22–32)
CREATININE: 1.16 mg/dL — AB (ref 0.44–1.00)
GFR calc Af Amer: >60 mL/min (ref >60)
GFR calc non Af Amer: 54 mL/min — ABNORMAL LOW (ref >60)
GLUCOSE: 262 mg/dL — AB (ref 65–99)
Potassium: 3.5 mmol/L (ref 3.5–5.1)
Sodium: 135 mmol/L (ref 135–145)

## 2017-03-16 LAB — CBC
HEMATOCRIT: 33.4 % — AB (ref 36.0–46.0)
HEMOGLOBIN: 11.1 g/dL — AB (ref 12.0–15.0)
MCH: 28.9 pg (ref 26.0–34.0)
MCHC: 33.2 g/dL (ref 30.0–36.0)
MCV: 87 fL (ref 78.0–100.0)
Platelets: 187 10*3/uL (ref 150–400)
RBC: 3.84 MIL/uL — ABNORMAL LOW (ref 3.87–5.11)
RDW: 13.7 % (ref 11.5–15.5)
WBC: 9.2 10*3/uL (ref 4.0–10.5)

## 2017-03-16 LAB — GLUCOSE, CAPILLARY
GLUCOSE-CAPILLARY: 230 mg/dL — AB (ref 65–99)
GLUCOSE-CAPILLARY: 164 mg/dL — AB (ref 65–99)
GLUCOSE-CAPILLARY: 108 mg/dL — AB (ref 65–99)
Glucose-Capillary: 257 mg/dL — ABNORMAL HIGH (ref 65–99)
Glucose-Capillary: 193 mg/dL — ABNORMAL HIGH (ref 65–99)

## 2017-03-16 MED ORDER — CIPROFLOXACIN HCL 500 MG PO TABS: 500.0000 mg | ORAL_TABLET | Freq: Two times a day (BID) | ORAL | Status: DC

## 2017-03-16 MED ORDER — LISINOPRIL 20 MG PO TABS
20.0000 mg | ORAL_TABLET | Freq: Every day | ORAL | Status: DC
  Administered 2017-03-16: 20 mg via ORAL
  Filled 2017-03-16: qty 1

## 2017-03-16 MED ORDER — CIPROFLOXACIN HCL 500 MG PO TABS: 500.0000 mg | ORAL_TABLET | Freq: Two times a day (BID) | ORAL | 0 refills | Status: DC

## 2017-03-16 MED ORDER — METOPROLOL SUCCINATE ER 100 MG PO TB24
100.0000 mg | ORAL_TABLET | Freq: Every day | ORAL | Status: DC
  Administered 2017-03-16: 100 mg via ORAL
  Filled 2017-03-16: qty 1

## 2017-03-16 NOTE — Progress Notes (Addendum)
Subjective: The patient states that she is somewhat improved.   Objective: Current vital signs: BP (!) 166/52 (BP Location: Right Arm)   Pulse 86   Temp 98.2 F (36.8 C) (Oral)   Resp 13   Ht '5\' 4"'$  (1.626 m)   Wt 65.2 kg (143 lb 11.8 oz)   SpO2 100%   BMI 24.67 kg/m  Vital signs in last 24 hours: Temp:  [98.2 F (36.8 C)-100 F (37.8 C)] 98.2 F (36.8 C) (01/18 0729) Pulse Rate:  [83-97] 86 (01/18 0729) Resp:  [13-19] 13 (01/18 0729) BP: (150-200)/(52-97) 166/52 (01/18 0729) SpO2:  [97 %-100 %] 100 % (01/18 0729) Weight:  [65.2 kg (143 lb 11.8 oz)] 65.2 kg (143 lb 11.8 oz) (01/17 1450)  Intake/Output from previous day: 01/17 0701 - 01/18 0700 In: 0277 [P.O.:288; I.V.:800; IV Piggyback:309] Out: 2925 [Urine:2925] Intake/Output this shift: No intake/output data recorded. Nutritional status: Diet Carb Modified Fluid consistency: Thin; Room service appropriate? Yes  General Exam: HEENT: [place]/AT Ext: Warm and well-perfused  Neurologic Exam: Ment: Awake and alert. Dysthymic affect. Poor eye contact. She follows all commands and converses normally with fluent speech.  CN: Exotropia noted. Otherwise, fixates and tracks normally. No nystagmus. No facial droop noted.  Motor: Poor effort. In this context there is 4/5 strength in upper extremities and 2/5 strength to hip flexion bilaterally, 3/5 knee extension on right, 2-3/5 knee extension on left.  Sensation: Intact to FT proximally. No extinction.  Cerebellar: Slow FNF bilaterally with no ataxia noted   Lab Results: Results for orders placed or performed during the hospital encounter of 03/14/17 (from the past 48 hour(s))  CBG monitoring, ED     Status: Abnormal   Collection Time: 03/14/17 11:04 AM  Result Value Ref Range   Glucose-Capillary 232 (H) 65 - 99 mg/dL   Comment 1 Notify RN    Comment 2 Document in Chart   CBC with Differential     Status: Abnormal   Collection Time: 03/14/17 11:53 AM  Result Value Ref Range    WBC 10.8 (H) 4.0 - 10.5 K/uL   RBC 4.39 3.87 - 5.11 MIL/uL   Hemoglobin 12.9 12.0 - 15.0 g/dL   HCT 39.1 36.0 - 46.0 %   MCV 89.1 78.0 - 100.0 fL   MCH 29.4 26.0 - 34.0 pg   MCHC 33.0 30.0 - 36.0 g/dL   RDW 13.9 11.5 - 15.5 %   Platelets 286 150 - 400 K/uL   Neutrophils Relative % 70 %   Neutro Abs 7.7 1.7 - 7.7 K/uL   Lymphocytes Relative 19 %   Lymphs Abs 2.0 0.7 - 4.0 K/uL   Monocytes Relative 9 %   Monocytes Absolute 1.0 0.1 - 1.0 K/uL   Eosinophils Relative 2 %   Eosinophils Absolute 0.2 0.0 - 0.7 K/uL   Basophils Relative 0 %   Basophils Absolute 0.0 0.0 - 0.1 K/uL  Comprehensive metabolic panel     Status: Abnormal   Collection Time: 03/14/17 11:53 AM  Result Value Ref Range   Sodium 141 135 - 145 mmol/L   Potassium 3.6 3.5 - 5.1 mmol/L   Chloride 104 101 - 111 mmol/L   CO2 24 22 - 32 mmol/L   Glucose, Bld 239 (H) 65 - 99 mg/dL   BUN 37 (H) 6 - 20 mg/dL   Creatinine, Ser 2.80 (H) 0.44 - 1.00 mg/dL   Calcium 10.0 8.9 - 10.3 mg/dL   Total Protein 7.1 6.5 - 8.1 g/dL  Albumin 3.8 3.5 - 5.0 g/dL   AST 24 15 - 41 U/L   ALT 73 (H) 14 - 54 U/L   Alkaline Phosphatase 113 38 - 126 U/L   Total Bilirubin 1.0 0.3 - 1.2 mg/dL   GFR calc non Af Amer 19 (L) >60 mL/min   GFR calc Af Amer 22 (L) >60 mL/min    Comment: (NOTE) The eGFR has been calculated using the CKD EPI equation. This calculation has not been validated in all clinical situations. eGFR's persistently <60 mL/min signify possible Chronic Kidney Disease.    Anion gap 13 5 - 15  Blood culture (routine x 2)     Status: None (Preliminary result)   Collection Time: 03/14/17 12:00 PM  Result Value Ref Range   Specimen Description BLOOD RIGHT ANTECUBITAL    Special Requests      BOTTLES DRAWN AEROBIC AND ANAEROBIC Blood Culture adequate volume   Culture NO GROWTH 1 DAY    Report Status PENDING   Urinalysis, Routine w reflex microscopic     Status: Abnormal   Collection Time: 03/14/17 12:06 PM  Result Value Ref  Range   Color, Urine YELLOW YELLOW   APPearance TURBID (A) CLEAR   Specific Gravity, Urine 1.015 1.005 - 1.030   pH 5.0 5.0 - 8.0   Glucose, UA >=500 (A) NEGATIVE mg/dL   Hgb urine dipstick MODERATE (A) NEGATIVE   Bilirubin Urine NEGATIVE NEGATIVE   Ketones, ur NEGATIVE NEGATIVE mg/dL   Protein, ur 100 (A) NEGATIVE mg/dL   Nitrite NEGATIVE NEGATIVE   Leukocytes, UA MODERATE (A) NEGATIVE   RBC / HPF 0-5 0 - 5 RBC/hpf   WBC, UA TOO NUMEROUS TO COUNT 0 - 5 WBC/hpf   Bacteria, UA MANY (A) NONE SEEN   Squamous Epithelial / LPF NONE SEEN NONE SEEN   WBC Clumps PRESENT    Budding Yeast PRESENT   Rapid urine drug screen (hospital performed)     Status: None   Collection Time: 03/14/17 12:06 PM  Result Value Ref Range   Opiates NONE DETECTED NONE DETECTED   Cocaine NONE DETECTED NONE DETECTED   Benzodiazepines NONE DETECTED NONE DETECTED   Amphetamines NONE DETECTED NONE DETECTED   Tetrahydrocannabinol NONE DETECTED NONE DETECTED   Barbiturates NONE DETECTED NONE DETECTED    Comment: (NOTE) DRUG SCREEN FOR MEDICAL PURPOSES ONLY.  IF CONFIRMATION IS NEEDED FOR ANY PURPOSE, NOTIFY LAB WITHIN 5 DAYS. LOWEST DETECTABLE LIMITS FOR URINE DRUG SCREEN Drug Class                     Cutoff (ng/mL) Amphetamine and metabolites    1000 Barbiturate and metabolites    200 Benzodiazepine                 010 Tricyclics and metabolites     300 Opiates and metabolites        300 Cocaine and metabolites        300 THC                            50   I-Stat CG4 Lactic Acid, ED     Status: Abnormal   Collection Time: 03/14/17 12:22 PM  Result Value Ref Range   Lactic Acid, Venous 2.03 (HH) 0.5 - 1.9 mmol/L   Comment NOTIFIED PHYSICIAN   I-Stat Venous Blood Gas, ED (order at [facility] and [facility] only)     Status: None  Collection Time: 03/14/17 12:22 PM  Result Value Ref Range   pH, Ven 7.348 7.250 - 7.430   pCO2, Ven 48.9 44.0 - 60.0 mmHg   pO2, Ven 42.0 32.0 - 45.0 mmHg   Bicarbonate 26.9 20.0 - 28.0  mmol/L   TCO2 28 22 - 32 mmol/L   O2 Saturation 74.0 %   Acid-Base Excess 1.0 0.0 - 2.0 mmol/L   Patient temperature HIDE    Sample type VENOUS   I-Stat Chem 8, ED     Status: Abnormal   Collection Time: 03/14/17 12:22 PM  Result Value Ref Range   Sodium 140 135 - 145 mmol/L   Potassium 3.6 3.5 - 5.1 mmol/L   Chloride 102 101 - 111 mmol/L   BUN 38 (H) 6 - 20 mg/dL   Creatinine, Ser 2.80 (H) 0.44 - 1.00 mg/dL   Glucose, Bld 235 (H) 65 - 99 mg/dL   Calcium, Ion 1.26 1.15 - 1.40 mmol/L   TCO2 27 22 - 32 mmol/L   Hemoglobin 13.6 12.0 - 15.0 g/dL   HCT 40.0 36.0 - 46.0 %  Blood culture (routine x 2)     Status: None (Preliminary result)   Collection Time: 03/14/17  1:09 PM  Result Value Ref Range   Specimen Description BLOOD LEFT ANTECUBITAL    Special Requests      BOTTLES DRAWN AEROBIC AND ANAEROBIC Blood Culture adequate volume   Culture NO GROWTH 1 DAY    Report Status PENDING   I-Stat CG4 Lactic Acid, ED     Status: None   Collection Time: 03/14/17  1:15 PM  Result Value Ref Range   Lactic Acid, Venous 1.60 0.5 - 1.9 mmol/L  Urine culture     Status: Abnormal   Collection Time: 03/14/17  1:29 PM  Result Value Ref Range   Specimen Description URINE, CLEAN CATCH    Special Requests Normal    Culture >=100,000 COLONIES/mL ESCHERICHIA COLI (A)    Report Status 03/16/2017 FINAL    Organism ID, Bacteria ESCHERICHIA COLI (A)       Susceptibility   Escherichia coli - MIC*    AMPICILLIN 8 SENSITIVE Sensitive     CEFAZOLIN <=4 SENSITIVE Sensitive     CEFTRIAXONE <=1 SENSITIVE Sensitive     CIPROFLOXACIN <=0.25 SENSITIVE Sensitive     GENTAMICIN <=1 SENSITIVE Sensitive     IMIPENEM <=0.25 SENSITIVE Sensitive     NITROFURANTOIN <=16 SENSITIVE Sensitive     TRIMETH/SULFA <=20 SENSITIVE Sensitive     AMPICILLIN/SULBACTAM 4 SENSITIVE Sensitive     PIP/TAZO <=4 SENSITIVE Sensitive     Extended ESBL NEGATIVE Sensitive     * >=100,000 COLONIES/mL ESCHERICHIA COLI  CBG monitoring,  ED     Status: Abnormal   Collection Time: 03/14/17  4:34 PM  Result Value Ref Range   Glucose-Capillary 257 (H) 65 - 99 mg/dL  CK     Status: Abnormal   Collection Time: 03/15/17 12:27 AM  Result Value Ref Range   Total CK 794 (H) 38 - 234 U/L  Magnesium     Status: None   Collection Time: 03/15/17 12:27 AM  Result Value Ref Range   Magnesium 1.9 1.7 - 2.4 mg/dL  RPR     Status: None   Collection Time: 03/15/17 12:27 AM  Result Value Ref Range   RPR Ser Ql Non Reactive Non Reactive    Comment: (NOTE) Performed At: [facility] [place], [place] [id number] Rush Farmer MD [id number]  TSH     Status: None   Collection Time: 03/15/17 12:27 AM  Result Value Ref Range   TSH 0.866 0.350 - 4.500 uIU/mL    Comment: Performed by a 3rd Generation assay with a functional sensitivity of <=0.01 uIU/mL.  Vitamin B12     Status: Abnormal   Collection Time: 03/15/17 12:27 AM  Result Value Ref Range   Vitamin B-12 971 (H) 180 - 914 pg/mL    Comment: (NOTE) This assay is not validated for testing neonatal or myeloproliferative syndrome specimens for Vitamin B12 levels.   Basic metabolic panel     Status: Abnormal   Collection Time: 03/15/17  4:23 AM  Result Value Ref Range   Sodium 142 135 - 145 mmol/L   Potassium 3.6 3.5 - 5.1 mmol/L   Chloride 109 101 - 111 mmol/L   CO2 21 (L) 22 - 32 mmol/L   Glucose, Bld 250 (H) 65 - 99 mg/dL   BUN 34 (H) 6 - 20 mg/dL   Creatinine, Ser 1.76 (H) 0.44 - 1.00 mg/dL    Comment: DELTA CHECK NOTED   Calcium 9.4 8.9 - 10.3 mg/dL   GFR calc non Af Amer 33 (L) >60 mL/min   GFR calc Af Amer 38 (L) >60 mL/min    Comment: (NOTE) The eGFR has been calculated using the CKD EPI equation. This calculation has not been validated in all clinical situations. eGFR's persistently <60 mL/min signify possible Chronic Kidney Disease.    Anion gap 12 5 - 15  CBC     Status: Abnormal   Collection Time: 03/15/17  4:23 AM  Result Value Ref  Range   WBC 9.6 4.0 - 10.5 K/uL   RBC 3.87 3.87 - 5.11 MIL/uL   Hemoglobin 11.4 (L) 12.0 - 15.0 g/dL   HCT 34.5 (L) 36.0 - 46.0 %   MCV 89.1 78.0 - 100.0 fL   MCH 29.5 26.0 - 34.0 pg   MCHC 33.0 30.0 - 36.0 g/dL   RDW 13.9 11.5 - 15.5 %   Platelets 198 150 - 400 K/uL  Ammonia     Status: None   Collection Time: 03/15/17  5:28 AM  Result Value Ref Range   Ammonia 21 9 - 35 umol/L  CBG monitoring, ED     Status: Abnormal   Collection Time: 03/15/17 11:36 AM  Result Value Ref Range   Glucose-Capillary 165 (H) 65 - 99 mg/dL  MRSA PCR Screening     Status: None   Collection Time: 03/15/17  2:48 PM  Result Value Ref Range   MRSA by PCR NEGATIVE NEGATIVE    Comment:        The GeneXpert MRSA Assay (FDA approved for NASAL specimens only), is one component of a comprehensive MRSA colonization surveillance program. It is not intended to diagnose MRSA infection nor to guide or monitor treatment for MRSA infections.   Glucose, capillary     Status: Abnormal   Collection Time: 03/15/17  7:46 PM  Result Value Ref Range   Glucose-Capillary 282 (H) 65 - 99 mg/dL  Glucose, capillary     Status: Abnormal   Collection Time: 03/16/17 12:04 AM  Result Value Ref Range   Glucose-Capillary 318 (H) 65 - 99 mg/dL  Glucose, capillary     Status: Abnormal   Collection Time: 03/16/17  4:07 AM  Result Value Ref Range   Glucose-Capillary 257 (H) 65 - 99 mg/dL  CBC     Status: Abnormal   Collection Time:  03/16/17  5:45 AM  Result Value Ref Range   WBC 9.2 4.0 - 10.5 K/uL   RBC 3.84 (L) 3.87 - 5.11 MIL/uL   Hemoglobin 11.1 (L) 12.0 - 15.0 g/dL   HCT 33.4 (L) 36.0 - 46.0 %   MCV 87.0 78.0 - 100.0 fL   MCH 28.9 26.0 - 34.0 pg   MCHC 33.2 30.0 - 36.0 g/dL   RDW 13.7 11.5 - 15.5 %   Platelets 187 150 - 400 K/uL  Basic metabolic panel     Status: Abnormal   Collection Time: 03/16/17  5:45 AM  Result Value Ref Range   Sodium 135 135 - 145 mmol/L   Potassium 3.5 3.5 - 5.1 mmol/L   Chloride 103  101 - 111 mmol/L   CO2 21 (L) 22 - 32 mmol/L   Glucose, Bld 262 (H) 65 - 99 mg/dL   BUN 20 6 - 20 mg/dL   Creatinine, Ser 1.16 (H) 0.44 - 1.00 mg/dL   Calcium 9.2 8.9 - 10.3 mg/dL   GFR calc non Af Amer 54 (L) >60 mL/min   GFR calc Af Amer >60 >60 mL/min    Comment: (NOTE) The eGFR has been calculated using the CKD EPI equation. This calculation has not been validated in all clinical situations. eGFR's persistently <60 mL/min signify possible Chronic Kidney Disease.    Anion gap 11 5 - 15  Glucose, capillary     Status: Abnormal   Collection Time: 03/16/17  7:28 AM  Result Value Ref Range   Glucose-Capillary 230 (H) 65 - 99 mg/dL    Recent Results (from the past 240 hour(s))  Blood culture (routine x 2)     Status: None (Preliminary result)   Collection Time: 03/14/17 12:00 PM  Result Value Ref Range Status   Specimen Description BLOOD RIGHT ANTECUBITAL  Final   Special Requests   Final    BOTTLES DRAWN AEROBIC AND ANAEROBIC Blood Culture adequate volume   Culture NO GROWTH 1 DAY  Final   Report Status PENDING  Incomplete  Blood culture (routine x 2)     Status: None (Preliminary result)   Collection Time: 03/14/17  1:09 PM  Result Value Ref Range Status   Specimen Description BLOOD LEFT ANTECUBITAL  Final   Special Requests   Final    BOTTLES DRAWN AEROBIC AND ANAEROBIC Blood Culture adequate volume   Culture NO GROWTH 1 DAY  Final   Report Status PENDING  Incomplete  Urine culture     Status: Abnormal   Collection Time: 03/14/17  1:29 PM  Result Value Ref Range Status   Specimen Description URINE, CLEAN CATCH  Final   Special Requests Normal  Final   Culture >=100,000 COLONIES/mL ESCHERICHIA COLI (A)  Final   Report Status 03/16/2017 FINAL  Final   Organism ID, Bacteria ESCHERICHIA COLI (A)  Final      Susceptibility   Escherichia coli - MIC*    AMPICILLIN 8 SENSITIVE Sensitive     CEFAZOLIN <=4 SENSITIVE Sensitive     CEFTRIAXONE <=1 SENSITIVE Sensitive      CIPROFLOXACIN <=0.25 SENSITIVE Sensitive     GENTAMICIN <=1 SENSITIVE Sensitive     IMIPENEM <=0.25 SENSITIVE Sensitive     NITROFURANTOIN <=16 SENSITIVE Sensitive     TRIMETH/SULFA <=20 SENSITIVE Sensitive     AMPICILLIN/SULBACTAM 4 SENSITIVE Sensitive     PIP/TAZO <=4 SENSITIVE Sensitive     Extended ESBL NEGATIVE Sensitive     * >=100,000 COLONIES/mL ESCHERICHIA COLI  MRSA PCR Screening     Status: None   Collection Time: 03/15/17  2:48 PM  Result Value Ref Range Status   MRSA by PCR NEGATIVE NEGATIVE Final    Comment:        The GeneXpert MRSA Assay (FDA approved for NASAL specimens only), is one component of a comprehensive MRSA colonization surveillance program. It is not intended to diagnose MRSA infection nor to guide or monitor treatment for MRSA infections.     Lipid Panel No results for input(s): CHOL, TRIG, HDL, CHOLHDL, VLDL, LDLCALC in the last 72 hours.  Studies/Results: Ct Abdomen Pelvis Wo Contrast  Result Date: 03/14/2017 CLINICAL DATA:  Nausea and vomiting. Syncope. Abdominal distention. EXAM: CT ABDOMEN AND PELVIS WITHOUT CONTRAST TECHNIQUE: Multidetector CT imaging of the abdomen and pelvis was performed following the standard protocol without IV contrast. COMPARISON:  CT abdomen pelvis 11/15/2016 FINDINGS: The examination is degraded by motion. Lower chest: No pulmonary nodules or pleural effusion. No visible pericardial effusion. Hepatobiliary: Normal hepatic contours and density. No visible biliary dilatation. Status post cholecystectomy. Pancreas: Normal contours without ductal dilatation. No peripancreatic fluid collection. Spleen: Normal. Adrenals/Urinary Tract: --Adrenal glands: Normal. --Right kidney/ureter: No hydronephrosis or perinephric stranding. No nephrolithiasis. No obstructing ureteral stones. --Left kidney/ureter: No hydronephrosis or perinephric stranding. No nephrolithiasis. No obstructing ureteral stones. --Urinary bladder: The bladder wall is  thickened. There is an indwelling Foley catheter. Stomach/Bowel: --Stomach/Duodenum: No hiatal hernia or other gastric abnormality. Normal duodenal course and caliber. --Small bowel: No dilatation or inflammation. --Colon: No focal abnormality. --Appendix: Normal. Vascular/Lymphatic: Atherosclerotic calcification is present within the non-aneurysmal abdominal aorta, without hemodynamically significant stenosis. No abdominal or pelvic lymphadenopathy. Reproductive: Status post tubal ligation.  Normal uterus. Musculoskeletal. No bony spinal canal stenosis or focal osseous abnormality. Other: Multiple injection granulomata within the anterior subcutaneous fat. IMPRESSION: 1. Motion degraded examination. 2. Bladder wall thickening may be due to underdistention, though cystitis may also have this appearance. 3.  Aortic Atherosclerosis (ICD10-I70.0). Electronically Signed   By: Ulyses Jarred M.D.   On: 03/14/2017 15:36   Ct Head Wo Contrast  Result Date: 03/14/2017 CLINICAL DATA:  Acute onset loss of consciousness today. The patient is unresponsive. History of multiple sclerosis. EXAM: CT HEAD WITHOUT CONTRAST TECHNIQUE: Contiguous axial images were obtained from the base of the skull through the vertex without intravenous contrast. COMPARISON:  Head CT scan 10/02/2013.  Brain MRI 12/31/2016. FINDINGS: Brain: Scattered areas of hypoattenuation in the periventricular deep white matter consistent with the patient's history of multiple sclerosis are identified. No evidence of acute abnormality including hemorrhage, infarct, mass lesion, mass effect, midline shift or abnormal extra-axial fluid collection is seen. There is no hydrocephalus or pneumocephalus. Vascular: Atherosclerosis noted. Skull: Intact. Sinuses/Orbits: Mucosal thickening and wall thickening in the right maxillary sinus are again seen. There is also mucosal thickening in scattered ethmoid air cells, the sphenoid sinuses and left frontal sinus. Ethmoid air  cell disease is improved since the prior CT scan. Other: None. IMPRESSION: No acute abnormality. Findings consistent with the patient's history of multiple sclerosis. Chronic sinus disease. Electronically Signed   By: Inge Rise M.D.   On: 03/14/2017 11:37   Mr Brain Wo Contrast  Result Date: 03/14/2017 CLINICAL DATA:  History of multiple sclerosis. Altered mental status. Confusion. Poorly responsive. EXAM: MRI HEAD WITHOUT CONTRAST TECHNIQUE: Multiplanar, multiecho pulse sequences of the brain and surrounding structures were obtained without intravenous contrast. COMPARISON:  CT same day.  MRI 12/31/2016 FINDINGS: Brain: Since the previous study, there  has been enlargement of the lesion within the right side of the splenium of the corpus callosum. I think there is also more abnormal signal within the middle cerebellar peduncles and within the body of the corpus callosum. No other progressive change. Multiple other foci of T2 and FLAIR signal within the brainstem and hemispheric deep white matter appear similar in number and size. There is resolution of restricted diffusion of the frontal lesions seen on the previous study. No new lesions show restricted diffusion. No sign of hemorrhage, hydrocephalus or extra-axial collection. Vascular: Major vessels at the base of the brain show flow. Skull and upper cervical spine: Negative Sinuses/Orbits: Redemonstration of mucosal inflammatory changes of the paranasal sinuses. Orbits negative. Other: None IMPRESSION: Multiple lesions of the corpus callosum are progressive since the previous study. I also think there is more abnormal signal within the middle cerebellar peduncles. This is consistent with progression of the demyelinating disease. Other supratentorial white matter lesions do not appear progressive. In fact, previously seen foci in the frontal lobes which showed restricted diffusion no longer do so. Electronically Signed   By: Nelson Chimes M.D.   On:  03/14/2017 20:45   Dg Chest Portable 1 View  Result Date: 03/14/2017 CLINICAL DATA:  Altered mental status EXAM: PORTABLE CHEST 1 VIEW COMPARISON:  Chest x-ray of 01/10/2017 FINDINGS: No definite pneumonia or pleural effusion is seen. Mediastinal and hilar contours appear stable and cardiomegaly is stable. No acute bony abnormality is noted. IMPRESSION: No active lung disease.  Stable cardiomegaly. Electronically Signed   By: Ivar Drape M.D.   On: 03/14/2017 12:33   Dg Hip Port Unilat With Pelvis 1v Right  Result Date: 03/14/2017 CLINICAL DATA:  Right hip pain. Syncopal episode in a chair. No fall. EXAM: DG HIP (WITH OR WITHOUT PELVIS) 1V PORT RIGHT COMPARISON:  Bilateral hip x-rays dated November 16, 2016. FINDINGS: There is no evidence of hip fracture or dislocation. There is no evidence of arthropathy or other focal bone abnormality. Prior tubal ligation. Multiple phleboliths in the pelvis are unchanged. IMPRESSION: Negative. Electronically Signed   By: Titus Dubin M.D.   On: 03/14/2017 15:05    Medications:  Scheduled: . amLODipine  10 mg Oral Daily  . aspirin EC  81 mg Oral Daily  . atorvastatin  40 mg Oral Daily  . baclofen  5 mg Oral TID  . DULoxetine  60 mg Oral Daily  . gabapentin  100 mg Oral TID  . heparin  5,000 Units Subcutaneous Q8H  . hydrALAZINE  10 mg Intravenous Once  . insulin aspart  0-15 Units Subcutaneous TID WC  . insulin glargine  15 Units Subcutaneous QHS  . pregabalin  100 mg Oral TID   Continuous: . sodium chloride 50 mL/hr at 03/16/17 0456  . aztreonam 1 g (03/16/17 9024)  . valproate sodium Stopped (03/16/17 0600)   OXB:[id number], hydrALAZINE, traMADol  EEG 1/17: EEG Description: There was generalized polymorphic delta and theta slowing (up to 6 Hz).  The delta slowing gradually decreased over time.  No posterior dominant rhythm or sleep architecture was seen.  No epileptiform discharge or seizure was present. Impression: This is an abnormal EEG  due to the presence of generalized polymorphic delta and theta slowing, suggesting moderate to severe encephalopathy which is nonspecific as to etiology.No epileptiform discharge or seizure was present.   Assessment/Recommendations:  52 y.o.femalewith Multiple sclerosis, depression, DM and HTN,presents with 1 day history of AMS and witnessed episode where patient becamesuddenlyunresponsive and hypoxic.On arrival  patient was hypotensive, had mild leukocytosis and UA suggestive of UTI. Also fund to have AKI. Patient started on antibiotics and admitted to medicine team. Delirium has resolved clinically.   1. After initial evaluation, the patient was loaded with valproic acid for possible subclinical seizures. Preliminary review of EEG by Dr. Lorraine Lax was negative for NCSE. On my subsequent review, there appeared to be no electrographic seizure activity. This is verified on the official report, which documents diffuse slowing, but no epileptiform activity. Exam reveals no evidence for clinical seizure activity.Delirium as the etiology for her unresponsive episode is now felt to be more likely. Will discontinue valproic acid.  2. Aggressive Multiple Sclerosis, on Rituxan as outpatient. Given her infection, Rituxan may not be an optimal disease modifying agent at this point in time. Will consider discharging her on Copaxone. Will need to discuss with her Neuroimmunologist, Dr. Felecia Shelling. New lesions are seen on MRI brain this admission relative to her prior MRI Brain in November, but some improvement regarding previously seen lesions is also noted.  3. Metabolic/infectious encephalopathy, resolving.  4. Acute Kidney injury. Continue IV fluids, management of acute kidney injury  5. Urinary tract infection. On aztreonam. Avoid Cefepime/Ceftriaxone if possible.  Addendum: Discussed case with Dr. Felecia Shelling. He agrees that holding Rituxan may be the safest option given her presentation with UTI. We are in agreement  that a trial of Copaxone would be reasonable as disease-modifying therapy for her MS. After her infection clears with antibiotic treatment, she should receive a trial dose of Copaxone here in the hospital prior to discharge to ensure that she tolerates it, followed by prescription for this medication as an outpatient. Dr. Felecia Shelling would like for the patient to be scheduled to be seen in his clinic at [facility] in 2-4 weeks following discharge.     LOS: 2 days   '@Electronically'$  signed: Dr. Kerney Elbe 03/16/2017  9:50 AM

## 2017-03-16 NOTE — Telephone Encounter (Signed)
Dr. Peggye Form at [facility] is requesting a call back from Dr. Epimenio Foot at [phone].

## 2017-03-16 NOTE — Discharge Summary (Signed)
Name: Debra [name] MRN: [id number] DOB: [birth year] 52 y.o. PCP: [name], MD  Date of Admission: 03/14/2017 10:54 AM Date of Discharge: 03/16/2017 Attending Physician: [name], MD  Discharge Diagnosis: 1. Altered mental status  2. E. Coli UTI  3. AKI   Principal Problem:   Encephalopathy Active Problems:   Aortic atherosclerosis (HCC)   Altered mental status   Urinary tract infection   Discharge Medications: Allergies as of 03/16/2017      Reactions   Penicillins Anaphylaxis, Nausea And Vomiting, Rash   Has patient had a PCN reaction causing immediate rash, facial/tongue/throat swelling, SOB or lightheadedness with hypotension: Yes Has patient had a PCN reaction causing severe rash involving mucus membranes or skin necrosis: Yes Has patient had a PCN reaction that required hospitalization No Has patient had a PCN reaction occurring within the last 10 years: Yes If all of the above answers are "NO", then may proceed with Cephalosporin use.   Pollen Extract Other (See Comments)   Seasonal Allergies   Tape Rash      Medication List    TAKE these medications   acetaminophen 325 MG tablet Commonly known as:  TYLENOL Take 650 mg by mouth every 6 (six) hours as needed for mild pain or fever.   AGAMATRIX PRESTO PRO METER Devi The patient is insulin requiring, ICD 10 code E10.9. The patient tests 4 times per day.   amLODipine 10 MG tablet Commonly known as:  NORVASC Take 1 tablet (10 mg total) by mouth daily.   aspirin EC 81 MG tablet Take 1 tablet (81 mg total) by mouth daily.   atorvastatin 40 MG tablet Commonly known as:  LIPITOR Take 1 tablet (40 mg total) by mouth daily.   Baclofen 5 MG Tabs Take 5 mg by mouth 3 (three) times daily.   bethanechol 50 MG tablet Commonly known as:  URECHOLINE Take 1 tablet (50 mg total) by mouth 3 (three) times daily.   ciprofloxacin 500 MG tablet Commonly known as:  CIPRO Take 1 tablet (500 mg  total) by mouth 2 (two) times daily. Start taking on:  03/17/2017   DULoxetine 60 MG capsule Commonly known as:  CYMBALTA Take 1 capsule (60 mg total) daily by mouth.   feeding supplement (GLUCERNA SHAKE) Liqd Take 237 mLs by mouth 2 (two) times daily between meals.   gabapentin 100 MG capsule Commonly known as:  NEURONTIN Take 1 capsule (100 mg total) 3 (three) times daily by mouth.   GLUCAGON HCL (RDNA) IJ Inject 1 mg into the muscle as needed (hypoglycemia-administer for CBG <60).   heparin 5000 UNIT/ML injection Inject 5,000 Units into the skin daily.   hydrALAZINE 25 MG tablet Commonly known as:  APRESOLINE Take 1 tablet (25 mg total) by mouth every 8 (eight) hours as needed (systolic goal 140).   insulin aspart 100 UNIT/ML injection Commonly known as:  novoLOG Inject 8 Units 3 (three) times daily with meals into the skin. What changed:    when to take this  additional instructions   insulin aspart 100 UNIT/ML injection Commonly known as:  novoLOG Inject 0-15 Units into the skin 3 (three) times daily with meals. What changed:  Another medication with the same name was changed. Make sure you understand how and when to take each.   insulin aspart 100 UNIT/ML injection Commonly known as:  novoLOG Inject 0-5 Units into the skin at bedtime. What changed:  Another medication with the same name was changed. Make sure you  understand how and when to take each.   insulin glargine 100 UNIT/ML injection Commonly known as:  LANTUS Inject 0.3 mLs (30 Units total) into the skin at bedtime. What changed:  how much to take   lisinopril 20 MG tablet Commonly known as:  PRINIVIL,ZESTRIL Take 1 tablet (20 mg total) by mouth daily.   magnesium hydroxide 400 MG/5ML suspension Commonly known as:  MILK OF MAGNESIA Take 30 mLs by mouth daily as needed for mild constipation. What changed:    how much to take  when to take this   metoprolol succinate 100 MG 24 hr  tablet Commonly known as:  TOPROL-XL Take 1 tablet (100 mg total) by mouth daily. Take with or immediately following a meal.   mirtazapine 45 MG tablet Commonly known as:  REMERON Take 1 tablet (45 mg total) by mouth at bedtime.   ondansetron 4 MG tablet Commonly known as:  ZOFRAN Take 4 mg by mouth daily.   pantoprazole 40 MG tablet Commonly known as:  PROTONIX Take 1 tablet (40 mg total) by mouth daily.   polyethylene glycol packet Commonly known as:  MIRALAX / GLYCOLAX Take 17 g by mouth daily as needed for mild constipation, moderate constipation or severe constipation.   pregabalin 100 MG capsule Commonly known as:  LYRICA Take 1 capsule (100 mg total) by mouth 3 (three) times daily.   risperiDONE 1 MG tablet Commonly known as:  RISPERDAL Take 1 tablet (1 mg total) at bedtime by mouth.   tamsulosin 0.4 MG Caps capsule Commonly known as:  FLOMAX Take 1 capsule (0.4 mg total) daily by mouth.   traMADol 50 MG tablet Commonly known as:  ULTRAM Take 1 tablet (50 mg total) by mouth every 6 (six) hours as needed for moderate pain or severe pain.   Vitamin D (Ergocalciferol) 50000 units Caps capsule Commonly known as:  DRISDOL Take 1 capsule (50,000 Units total) by mouth every 7 (seven) days.   zolpidem 5 MG tablet Commonly known as:  AMBIEN Take 1 tablet (5 mg total) by mouth at bedtime as needed for sleep.       Disposition and follow-up:   [name] was discharged from [facility] in Stable condition.  At the hospital follow up visit please address:  1.  Please assess mental status and for urinary symptoms. Please ensure she completes 4 days of ciprofloxacin for UTI.   2.  Labs / imaging needed at time of follow-up: BMP to check Cr   3.  Pending labs/ test needing follow-up: None   Follow-up Appointments: Contact information for after-discharge care    Destination    [facility] SN[facility] Follow up.    Service:  Skilled Nursing Contact information: [address] [place] [address] [phone]              Hospital Course by problem list: Principal Problem:   Encephalopathy Active Problems:   Aortic atherosclerosis (HCC)   Altered mental status   Urinary tract infection   1. Altered mental status: Patient presented from LTAC with acute onset encephalopathy and not verbally responsive on presentation to the ED. she was also hypotensive and found to have a leukocytosis. She was found to have  UTI and was started on aztreonam given her history of anaphylaxis with penicillins in the past.  Her urine culture grew E. Coli that was pan-sensitive and she was switched to ciprofloxacin.  She will complete 4 days of  ciprofloxacin 500 mg twice daily.  Blood cultures negative x2. Hypotension and leukocytosis resolved prior to discharge.   2. AKI: Serum Cr 2.8 on admission thought to be secondary to hypotension and dehydration. She received IVF with marked improvement in renal function. SCr 1.1 on day of discharge.    Discharge Vitals:   BP (!) 180/96 (BP Location: Right Arm)   Pulse 89   Temp 98.6 F (37 C) (Oral)   Resp 17   Ht 5\' 4"  (1.626 m)   Wt 143 lb 11.8 oz (65.2 kg)   SpO2 100%   BMI 24.67 kg/m   Pertinent Labs, Studies, and Procedures:   CBC Latest Ref Rng & Units 03/16/2017 03/15/2017 03/14/2017  WBC 4.0 - 10.5 K/uL 9.2 9.6 -  Hemoglobin 12.0 - 15.0 g/dL 11.1(L) 11.4(L) 13.6  Hematocrit 36.0 - 46.0 % 33.4(L) 34.5(L) 40.0  Platelets 150 - 400 K/uL 187 198 -   BMP Latest Ref Rng & Units 03/16/2017 03/15/2017 03/14/2017  Glucose 65 - 99 mg/dL 213(Y) 865(H) 846(N)  BUN 6 - 20 mg/dL 20 62(X) 52(W)  Creatinine 0.44 - 1.00 mg/dL 4.13(K) 4.40(N) 0.27(O)  BUN/Creat Ratio 9 - 23 - - -  Sodium 135 - 145 mmol/L 135 142 140  Potassium 3.5 - 5.1 mmol/L 3.5 3.6 3.6  Chloride 101 - 111 mmol/L 103 109 102  CO2 22 - 32 mmol/L 21(L) 21(L) -  Calcium 8.9 - 10.3 mg/dL  9.2 9.4 -    Discharge Instructions: Discharge Instructions    Call MD for:  difficulty breathing, headache or visual disturbances   Complete by:  As directed    Call MD for:  extreme fatigue   Complete by:  As directed    Call MD for:  temperature >100.4   Complete by:  As directed    Diet Carb Modified   Complete by:  As directed    Discharge instructions   Complete by:  As directed    You were admitted to the hospital due to confusion due to a urinary tract infection. You had a bacteria called E.coli in your urine. We treated you with antibiotics. You will need to take ciprofloxacin 500 mid twice daily for the next 4 days. Your doctor will need to do some blood work to check your kidneys. Please call us if you have any questions.   Increase activity slowly   Complete by:  As directed       Signed: [name], MD 03/16/2017, 2:45 PM   Pager: [phone]

## 2017-03-16 NOTE — Clinical Social Work Note (Signed)
Clinical Social Work Assessment  Patient Details  Name: Debra [name] MRN: [id number] Date of Birth: Sep 30, 1965  Date of referral:  03/16/17               Reason for consult:  Facility Placement                Permission sought to share information with:  Oceanographer granted to share information::  Yes, Verbal Permission Granted  Name::        Agency::  [facility]  Relationship::     Contact Information:     Housing/Transportation Living arrangements for the past 2 months:  Skilled Nursing Facility([facility]) Source of Information:  Patient, Medical Team Patient Interpreter Needed:  None Criminal Activity/Legal Involvement Pertinent to Current Situation/Hospitalization:  No - Comment as needed Significant Relationships:  Adult Children, Other Family Members Lives with:    Do you feel safe going back to the place where you live?  Yes Need for family participation in patient care:  No (Coment)  Care giving concerns:  Pt came in from SNF where she was for about a week getting rehab- no concerns with care at facility at this time and continues to be too weak to return home.   Social Worker assessment / plan:  CSW spoke with pt concerning plan for time of DC.  Pt confirmed she [facility] and that plan would be to return at time of DC.  Employment status:  Unemployed Health and safety inspector:  Self Pay (Medicaid Pending) PT Recommendations:  Not assessed at this time Information / Referral to community resources:  Skilled Nursing Facility  Patient/Family's Response to care:  Pt agreeable to return to SNF but not enthusiastic about her limited options.  Patient/Family's Understanding of and Emotional Response to Diagnosis, Current Treatment, and Prognosis:  Unclear level of understanding- pt seemed somewhat flat and depressed during short conversation has a lot of acute medical problems for such a young age.  Emotional  Assessment Appearance:  Appears stated age Attitude/Demeanor/Rapport:    Affect (typically observed):  Appropriate Orientation:  Oriented to  Time, Oriented to Place, Oriented to Self, Oriented to Situation Alcohol / Substance use:  Not Applicable Psych involvement (Current and /or in the community):  No (Comment)  Discharge Needs  Concerns to be addressed:  Care Coordination Readmission within the last 30 days:  Yes Current discharge risk:  Physical Impairment, Chronically ill Barriers to Discharge:  Continued Medical Work up   Burna Sis, LCSW 03/16/2017, 11:11 AM

## 2017-03-16 NOTE — Evaluation (Signed)
Clinical/Bedside Swallow Evaluation Patient Details  Name: Debra [name] MRN: [id number] Date of Birth: 1965-05-24  Today's Date: 03/16/2017 Time: SLP Start Time (ACUTE ONLY): 0806 SLP Stop Time (ACUTE ONLY): 0823 SLP Time Calculation (min) (ACUTE ONLY): 17 min  Past Medical History:  Past Medical History:  Diagnosis Date  . Adenomatous colonic polyps   . Anemia    2005  . Anxiety    1990  . Arthritis   . Asthma    2000  . Cataract   . Depression with psychotic symptoms   . Difficult intubation    narrow airway  . Gastroesophageal Reflux Disease (GERD)   . Heart murmur    Birth  . Hyperlipidemia    2005  . Hypertension    1998  . Internal hemorrhoids 04/24/06   on colonoscopy  . Migraine   . Multiple sclerosis (HCC)   . Neuropathic bladder   . Neuropathy of the hands & feet   . Restless Leg Syndrome   . Right Ankle Fracture 10/06/2013  . Sleep paralysis   . Stable angina pectoris    2007: cath showing normal cors.   . Stroke 1990  . Type I Diabetes Mellitus 1988   Past Surgical History:  Past Surgical History:  Procedure Laterality Date  . bilateral foot surgery    . BREAST SURGERY Left    biopsy left breast  . CARDIAC CATHETERIZATION  2007   around 2007 or 2008  . CESAREAN SECTION    . CHOLECYSTECTOMY    . COLONOSCOPY W/ BIOPSIES AND POLYPECTOMY    . DILATION AND CURETTAGE OF UTERUS  12/29/2010   Surgeon: Tereso Newcomer, MD;  Location: [facility];  Service: Gynecology  . ENDOMETRIAL ABLATION W/ NOVASURE N/A 12/2009  . EYE SURGERY    . HYSTEROSCOPY  12/29/2010   Procedure: HYSTEROSCOPY WITH HYDROTHERMAL ABLATION;  Surgeon: Tereso Newcomer, MD;  Location: [facility];  Service: Gynecology;;  . IUD REMOVAL  12/29/2010   Procedure: INTRAUTERINE DEVICE (IUD) REMOVAL;  Surgeon: Tereso Newcomer, MD;  Location: [facility];  Service: Gynecology;  Laterality: N/A;  . LAPAROSCOPIC TUBAL LIGATION  12/29/2010   Procedure: LAPAROSCOPIC TUBAL LIGATION;  Surgeon: Tereso Newcomer, MD;  Location: [facility];  Service: Gynecology;  Laterality: Bilateral;  . ORIF ANKLE FRACTURE Right 10/09/2013   Procedure: Open reduction internal fixation right ankle;  Surgeon: Mable Paris, MD;  Location: [facility] OR;  Service: Orthopedics;  Laterality: Right;  Open reduction internal fixation right ankle  . TRIGGER FINGER RELEASE     x 3   HPI:  52 y.o.femalewith Multiple sclerosis, depression, DMandHTN,presents with 1 dayhistory ofAMS and witnessed episode where patient becamesuddenlyunresponsive and hypoxic. EEG suggesting moderate to severe encephalopathy. MRI (1/16) revealed multiple progessive lesions of the corpus callosum and more abnormal signal within the middle cerebellar peduncles. Currently on regular diet.   Assessment / Plan / Recommendation Clinical Impression   Pt presents with functional swallow across consistencies. Oral mech exam performed [facility]. Pt able to self feed without assistance. No overt s/s of aspiration during evaluation. Pt reports no hx of swallowing difficulty. Min verbal cues needed to encourage participation throughout trials. Recommend continued diet with regular (carb modified) solids and thin liquids with meds administered whole in thin liquid. Given pt functional swallow ability, SLP intervention not warranted at this time. Please re-consult in the event of acute changes.   SLP Visit Diagnosis: Dysphagia, unspecified (R13.10)    Aspiration Risk  No limitations  Diet Recommendation Regular;Thin liquid   Liquid Administration via: Cup;Straw Medication Administration: Whole meds with liquid Supervision: Patient able to self feed Postural Changes: Seated upright at 90 degrees    Other  Recommendations Oral Care Recommendations: Oral care BID   Follow up Recommendations Skilled Nursing facility(Return to SNF)      Frequency and Duration            Prognosis        Swallow Study   General HPI: 52 y.o.femalewith  Multiple sclerosis, depression, DMandHTN,presents with 1 dayhistory ofAMS and witnessed episode where patient becamesuddenlyunresponsive and hypoxic. EEG suggesting moderate to severe encephalopathy. MRI (1/16) revealed multiple progessive lesions of the corpus callosum and more abnormal signal within the middle cerebellar peduncles. Currently on regular diet. Type of Study: Bedside Swallow Evaluation Previous Swallow Assessment: No Diet Prior to this Study: Regular;Thin liquids Temperature Spikes Noted: No Respiratory Status: Room air History of Recent Intubation: No Behavior/Cognition: Alert;Cooperative Oral Cavity Assessment: Within Functional Limits Oral Care Completed by SLP: No Oral Cavity - Dentition: Adequate natural dentition Vision: Functional for self-feeding Self-Feeding Abilities: Able to feed self Patient Positioning: Upright in bed Baseline Vocal Quality: Normal Volitional Cough: Weak Volitional Swallow: Able to elicit    Oral/Motor/Sensory Function Overall Oral Motor/Sensory Function: Within functional limits   Ice Chips Ice chips: Not tested   Thin Liquid Thin Liquid: Within functional limits Presentation: Cup    Nectar Thick Nectar Thick Liquid: Not tested   Honey Thick Honey Thick Liquid: Not tested   Puree Puree: Within functional limits Presentation: Spoon;Self Fed   Solid   GO   Solid: Within functional limits Presentation: Self Fed       [place] Mayla Biddy SLP Student Clinician  [place] Lorine Iannaccone 03/16/2017,9:10 AM

## 2017-03-16 NOTE — NC FL2 (Signed)
[place] MEDICAID FL2 LEVEL OF CARE SCREENING TOOL     IDENTIFICATION  Patient Name: Debra [name] Birthdate: 21-Apr-1965 Sex: female Admission Date (Current Location): 03/14/2017  [facility] and [place] Number:  Producer, television/film/video and Address:  [facility] [place], [address] [place], [place] [address]      Provider Number: [id number]  Attending Physician Name and Address:  [name], MD  Relative Name and Phone Number:       Current Level of Care: Hospital Recommended Level of Care: Skilled Nursing Facility Prior Approval Number:    Date Approved/Denied:   PASRR Number: [id number] F; end date 03/30/2017  Discharge Plan: SNF    Current Diagnoses: Patient Active Problem List   Diagnosis Date Noted  . Encephalopathy 03/15/2017  . Aortic atherosclerosis (HCC) 03/14/2017  . Altered mental status 03/14/2017  . Urinary tract infection 03/14/2017  . Major depressive disorder, single episode, severe without psychosis (HCC) 01/02/2017  . Multiple sclerosis exacerbation (HCC) 01/01/2017  . Dysthymia 12/27/2016  . Major depressive disorder, recurrent, severe with psychotic features (HCC)   . Multiple joint pain   . HTN (hypertension)   . Acute pain of left knee   . Neurogenic bladder   . Acute lower UTI   . Labile blood pressure   . Acute blood loss anemia   . Leukocytosis   . Labile blood glucose   . Hypertensive crisis   . Hypoglycemia   . Poorly controlled type 2 diabetes mellitus with peripheral neuropathy (HCC)   . Uncontrolled hypertension   . Sleep disturbance   . Multiple sclerosis (HCC) 11/22/2016  . Thoracic root lesion 11/22/2016  . MS (multiple sclerosis) (HCC)   . Neuropathic pain   . Uncontrolled type 2 diabetes mellitus with peripheral neuropathy (HCC)   . Urinary retention   . Medically noncompliant   . Leg weakness, bilateral 11/20/2016  . Diabetic ketoacidosis without coma associated with type 1 diabetes mellitus  (HCC)   . Leg pain   . History of CVA (cerebrovascular accident)   . Uncontrolled type 1 diabetes mellitus with diabetic peripheral neuropathy (HCC)   . Benign essential HTN   . Hypertensive urgency   . AKI (acute kidney injury) (HCC)   . Fibromyalgia 11/07/2016  . Back pain 10/31/2016  . Stable angina pectoris (HCC)   . Vitamin D deficiency 08/31/2014  . 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 Hyperlipidemia 10/06/2013  . Depression 10/16/2012  . Bilateral knee pain 02/16/2012  . Chronic diarrhea 10/19/2010  . Peripheral neuropathy 08/19/2010  . Type 1 diabetes mellitus (HCC) 07/06/2010  . Hypertension 07/06/2010  . Asthma 07/06/2010  . Migraine 07/06/2010    Orientation RESPIRATION BLADDER Height & Weight     Self, Time, Situation, Place  Normal Incontinent, Indwelling catheter Weight: 143 lb 11.8 oz (65.2 kg) Height:  5\' 4"  (162.6 cm)  BEHAVIORAL SYMPTOMS/MOOD NEUROLOGICAL BOWEL NUTRITION STATUS      Incontinent Diet  AMBULATORY STATUS COMMUNICATION OF NEEDS Skin   Extensive Assist Verbally Normal                       Personal Care Assistance Level of Assistance  Bathing, Dressing Bathing Assistance: Maximum assistance   Dressing Assistance: Maximum assistance     Functional Limitations Info  Sight Sight Info: Impaired(glasses)        SPECIAL CARE FACTORS FREQUENCY  PT (By licensed PT), OT (By licensed OT)  PT Frequency: 5/wk OT Frequency: 5/wk            Contractures      Additional Factors Info  Code Status, Allergies, Insulin Sliding Scale, Psychotropic Code Status Info: FULL Allergies Info: Penicillins, Pollen Extract, Tape Psychotropic Info: cymbalta Insulin Sliding Scale Info: 4/day       Current Medications (03/16/2017):  This is the current hospital active medication list Current Facility-Administered Medications  Medication Dose Route Frequency Provider Last Rate Last Dose  . 0.9 %  sodium chloride  infusion   Intravenous Continuous [name], MD 50 mL/hr at 03/16/17 0456    . amLODipine (NORVASC) tablet 10 mg  10 mg Oral Daily [name], MD   10 mg at 03/15/17 1651  . aspirin EC tablet 81 mg  81 mg Oral Daily Eulah Pont, MD   81 mg at 03/15/17 1651  . atorvastatin (LIPITOR) tablet 40 mg  40 mg Oral Daily Eulah Pont, MD   40 mg at 03/15/17 1314  . aztreonam (AZACTAM) 1 g in dextrose 5 % 50 mL IVPB  1 g Intravenous Q8H Narendra, Nischal, MD 100 mL/hr at 03/16/17 0607 1 g at 03/16/17 0607  . baclofen (LIORESAL) tablet 5 mg  5 mg Oral TID Eulah Pont, MD   5 mg at 03/15/17 2157  . DULoxetine (CYMBALTA) DR capsule 60 mg  60 mg Oral Daily Eulah Pont, MD   60 mg at 03/15/17 1313  . gabapentin (NEURONTIN) capsule 100 mg  100 mg Oral TID Toney Rakes, MD   100 mg at 03/15/17 2145  . heparin injection 5,000 Units  5,000 Units Subcutaneous Q8H Eulah Pont, MD   5,000 Units at 03/16/17 0500  . hydrALAZINE (APRESOLINE) injection 10 mg  10 mg Intravenous Once [name], MD      . hydrALAZINE (APRESOLINE) injection 10 mg  10 mg Intravenous Q4H PRN Santos-Sanchez, Idalys, MD      . hydrALAZINE (APRESOLINE) tablet 25 mg  25 mg Oral Q8H PRN [name], MD   25 mg at 03/16/17 0607  . insulin aspart (novoLOG) injection 0-15 Units  0-15 Units Subcutaneous TID WC Toney Rakes, MD   5 Units at 03/16/17 [phone]  . insulin glargine (LANTUS) injection 15 Units  15 Units Subcutaneous QHS Toney Rakes, MD   15 Units at 03/15/17 2146  . pregabalin (LYRICA) capsule 100 mg  100 mg Oral TID Toney Rakes, MD   100 mg at 03/15/17 2145  . traMADol (ULTRAM) tablet 50 mg  50 mg Oral Q6H PRN [name], MD   50 mg at 03/16/17 0821  . valproate (DEPACON) 300 mg in dextrose 5 % 50 mL IVPB  300 mg Intravenous Q8H Aroor, Dara Lords, MD   Stopped at 03/16/17 0600     Discharge Medications: Please see discharge summary for a list of discharge  medications.  Relevant Imaging Results:  Relevant Lab Results:   Additional Information SSN [id number]  Burna Sis, LCSW

## 2017-03-16 NOTE — Progress Notes (Signed)
[facility] will [facility] to [facility] Anticipated discharge date: 1/18 Transportation by [facility]- scheduled for 4:30pm  CSW signing off.  Burna Sis, LCSW Clinical Social Worker [phone]

## 2017-03-16 NOTE — Progress Notes (Signed)
   Subjective:  No acute events overnight.  Continues to have a depressed mood. No complaints this morning. Discussed we are awaiting sensitivities to switch to oral antibiotic therapy and if results obtained today likely discharge to [facility]. Patient stated that is terrible, but in agreement with plan.   Objective:  Vital signs in last 24 hours: Vitals:   03/15/17 2300 03/16/17 0000 03/16/17 0100 03/16/17 0300  BP: (!) 174/86 (!) 161/81 (!) 150/77 (!) 163/88  Pulse: 91 87 85 83  Resp: 19 17 17 16   Temp: 98.4 F (36.9 C)   98.4 F (36.9 C)  TempSrc: Oral   Oral  SpO2: 100% 99% 99% 99%  Weight:      Height:       General: thin female with depressed mood, lying in bed in no acute distress  Cardiac: RRR, nl S1/S2, no murmurs, rubs, or gallops  Pulm: CTAB, no wheezes or crackles, no increased work of breathing on room air Abd: soft, NTND, normoactive bowel sounds  Ext: warm and well perfused without peripheral edema noted    Assessment/Plan:  Principal Problem:   Encephalopathy Active Problems:   Aortic atherosclerosis (HCC)   Altered mental status   Urinary tract infection  Debra [name] is a 52 year old female with history of MS, neurogenic bladder, T1DM, depression with catatonia presents with acute change in mental status found to be hypotensive with AKI, leukocytosis and many bacteria on urinalysis and CT with possible inflammation of the bladder.  # Altered mental status   # UTI  Patient presented with acute onset AMS and found to have a UTI. Her mental status is back at baseline. Ucx positive for pan-sensitive  E. coli. Currently on aztreonam, but will switch to ciprofloxacin. Renal function continues to improve, Cr 1.1 this morning. UOP 2.9L. Blood cx NGTD. EEG negative for seizure activity.   - Discontinue aztreonam  - Ciprofloxacin 500 BID x 4 days    # AKI  S/p foley catheter exchange 1/16. Renal function continues to improve. UOP 2.9L.  - Continue to monitor UOP  and renal function  - UTI treatment as above   # T1DM - Lantus 15U QHS  - SSI-M + CBG monitoring   # Progressive Multiple sclerosis   Started on Rituxan recently for maintenance therapy. Next dose due on 1/21. MRI on admission showed progression of corpus callosum lesions and more abnormal signals in the cerebellar peduncles consistent with demyelinating disease. There is also mild improvement in older lesions.  - Continue baclofen   # Hemoglobinuria  Urinalysis with moderate hemoglobinuria without RBCs in the setting of AKI and altered mental status. CK 800.    Dispo: Anticipated discharge in approximately 1-2 day(s) pending ongoing treatment for UTI.   Debra Cash, MD 03/16/2017, 6:27 AM Pager: [phone]

## 2017-03-16 NOTE — Care Management Note (Signed)
Case Management Note  Patient Details  Name: Debra [name] MRN: [id number] Date of Birth: [birth year]  Subjective/Objective:    [facility] , presents with encephalopathy, for dc to SNF today. CSW following.                Action/Plan: DC to SNF.  Expected Discharge Date:  03/16/17               Expected Discharge Plan:  Skilled Nursing Facility  In-House Referral:  Clinical Social Work  Discharge planning Services  CM Consult  Post Acute Care Choice:    Choice offered to:     DME Arranged:    DME Agency:     HH Arranged:    HH Agency:     Status of Service:  Completed, signed off  If discussed at [employer], dates discussed:    Additional Comments:  Leone Haven, RN 03/16/2017, 2:52 PM

## 2017-03-19 ENCOUNTER — Inpatient Hospital Stay (HOSPITAL_COMMUNITY): Admit: 2017-03-19 | Payer: Self-pay

## 2017-03-19 LAB — CULTURE, BLOOD (ROUTINE X 2)
Culture: NO GROWTH
Culture: NO GROWTH
SPECIAL REQUESTS: ADEQUATE
Special Requests: ADEQUATE

## 2017-04-12 ENCOUNTER — Other Ambulatory Visit: Payer: Self-pay | Admitting: Pulmonary Disease

## 2017-04-12 ENCOUNTER — Inpatient Hospital Stay (HOSPITAL_COMMUNITY): Payer: Medicaid Other

## 2017-04-12 ENCOUNTER — Inpatient Hospital Stay (HOSPITAL_COMMUNITY)
Admission: EM | Admit: 2017-04-12 | Discharge: 2017-04-24 | DRG: 698 | Disposition: A | Discharge status: Skilled Nursing Facility | Payer: Medicaid Other | Attending: Family Medicine | Admitting: Family Medicine

## 2017-04-12 ENCOUNTER — Other Ambulatory Visit (HOSPITAL_COMMUNITY): Payer: Self-pay

## 2017-04-12 ENCOUNTER — Emergency Department (HOSPITAL_COMMUNITY): Payer: Medicaid Other

## 2017-04-12 DIAGNOSIS — J45909 Unspecified asthma, uncomplicated: Secondary | ICD-10-CM | POA: Diagnosis present

## 2017-04-12 DIAGNOSIS — R6521 Severe sepsis with septic shock: Secondary | ICD-10-CM | POA: Diagnosis not present

## 2017-04-12 DIAGNOSIS — N183 Chronic kidney disease, stage 3 (moderate): Secondary | ICD-10-CM | POA: Diagnosis present

## 2017-04-12 DIAGNOSIS — G92 Toxic encephalopathy: Secondary | ICD-10-CM | POA: Diagnosis present

## 2017-04-12 DIAGNOSIS — G2581 Restless legs syndrome: Secondary | ICD-10-CM | POA: Diagnosis present

## 2017-04-12 DIAGNOSIS — A419 Sepsis, unspecified organism: Secondary | ICD-10-CM | POA: Diagnosis not present

## 2017-04-12 DIAGNOSIS — L8990 Pressure ulcer of unspecified site, unspecified stage: Secondary | ICD-10-CM

## 2017-04-12 DIAGNOSIS — T83518A Infection and inflammatory reaction due to other urinary catheter, initial encounter: Secondary | ICD-10-CM | POA: Diagnosis present

## 2017-04-12 DIAGNOSIS — Z794 Long term (current) use of insulin: Secondary | ICD-10-CM

## 2017-04-12 DIAGNOSIS — N136 Pyonephrosis: Secondary | ICD-10-CM | POA: Diagnosis present

## 2017-04-12 DIAGNOSIS — Y738 Miscellaneous gastroenterology and urology devices associated with adverse incidents, not elsewhere classified: Secondary | ICD-10-CM | POA: Diagnosis not present

## 2017-04-12 DIAGNOSIS — T83091A Other mechanical complication of indwelling urethral catheter, initial encounter: Secondary | ICD-10-CM | POA: Diagnosis not present

## 2017-04-12 DIAGNOSIS — J9600 Acute respiratory failure, unspecified whether with hypoxia or hypercapnia: Secondary | ICD-10-CM

## 2017-04-12 DIAGNOSIS — G9340 Encephalopathy, unspecified: Secondary | ICD-10-CM

## 2017-04-12 DIAGNOSIS — Z841 Family history of disorders of kidney and ureter: Secondary | ICD-10-CM

## 2017-04-12 DIAGNOSIS — I130 Hypertensive heart and chronic kidney disease with heart failure and stage 1 through stage 4 chronic kidney disease, or unspecified chronic kidney disease: Secondary | ICD-10-CM | POA: Diagnosis present

## 2017-04-12 DIAGNOSIS — A4150 Gram-negative sepsis, unspecified: Secondary | ICD-10-CM | POA: Diagnosis present

## 2017-04-12 DIAGNOSIS — Z1621 Resistance to vancomycin: Secondary | ICD-10-CM | POA: Diagnosis present

## 2017-04-12 DIAGNOSIS — E876 Hypokalemia: Secondary | ICD-10-CM | POA: Diagnosis not present

## 2017-04-12 DIAGNOSIS — N170 Acute kidney failure with tubular necrosis: Secondary | ICD-10-CM | POA: Diagnosis present

## 2017-04-12 DIAGNOSIS — R4182 Altered mental status, unspecified: Secondary | ICD-10-CM | POA: Diagnosis present

## 2017-04-12 DIAGNOSIS — Z515 Encounter for palliative care: Secondary | ICD-10-CM

## 2017-04-12 DIAGNOSIS — I129 Hypertensive chronic kidney disease with stage 1 through stage 4 chronic kidney disease, or unspecified chronic kidney disease: Secondary | ICD-10-CM | POA: Diagnosis present

## 2017-04-12 DIAGNOSIS — Z7189 Other specified counseling: Secondary | ICD-10-CM

## 2017-04-12 DIAGNOSIS — K7200 Acute and subacute hepatic failure without coma: Secondary | ICD-10-CM | POA: Diagnosis not present

## 2017-04-12 DIAGNOSIS — N319 Neuromuscular dysfunction of bladder, unspecified: Secondary | ICD-10-CM | POA: Diagnosis present

## 2017-04-12 DIAGNOSIS — Y846 Urinary catheterization as the cause of abnormal reaction of the patient, or of later complication, without mention of misadventure at the time of the procedure: Secondary | ICD-10-CM | POA: Diagnosis present

## 2017-04-12 DIAGNOSIS — R402212 Coma scale, best verbal response, none, at arrival to emergency department: Secondary | ICD-10-CM | POA: Diagnosis present

## 2017-04-12 DIAGNOSIS — Z9049 Acquired absence of other specified parts of digestive tract: Secondary | ICD-10-CM

## 2017-04-12 DIAGNOSIS — Z8673 Personal history of transient ischemic attack (TIA), and cerebral infarction without residual deficits: Secondary | ICD-10-CM

## 2017-04-12 DIAGNOSIS — F329 Major depressive disorder, single episode, unspecified: Secondary | ICD-10-CM | POA: Diagnosis present

## 2017-04-12 DIAGNOSIS — R402342 Coma scale, best motor response, flexion withdrawal, at arrival to emergency department: Secondary | ICD-10-CM | POA: Diagnosis present

## 2017-04-12 DIAGNOSIS — N320 Bladder-neck obstruction: Secondary | ICD-10-CM

## 2017-04-12 DIAGNOSIS — J9601 Acute respiratory failure with hypoxia: Secondary | ICD-10-CM | POA: Diagnosis present

## 2017-04-12 DIAGNOSIS — M25551 Pain in right hip: Secondary | ICD-10-CM

## 2017-04-12 DIAGNOSIS — I5030 Unspecified diastolic (congestive) heart failure: Secondary | ICD-10-CM | POA: Diagnosis not present

## 2017-04-12 DIAGNOSIS — E10649 Type 1 diabetes mellitus with hypoglycemia without coma: Secondary | ICD-10-CM | POA: Diagnosis present

## 2017-04-12 DIAGNOSIS — Z880 Allergy status to penicillin: Secondary | ICD-10-CM

## 2017-04-12 DIAGNOSIS — Z87440 Personal history of urinary (tract) infections: Secondary | ICD-10-CM

## 2017-04-12 DIAGNOSIS — E870 Hyperosmolality and hypernatremia: Secondary | ICD-10-CM | POA: Diagnosis not present

## 2017-04-12 DIAGNOSIS — G35 Multiple sclerosis: Secondary | ICD-10-CM | POA: Diagnosis present

## 2017-04-12 DIAGNOSIS — M1990 Unspecified osteoarthritis, unspecified site: Secondary | ICD-10-CM | POA: Diagnosis present

## 2017-04-12 DIAGNOSIS — Z803 Family history of malignant neoplasm of breast: Secondary | ICD-10-CM

## 2017-04-12 DIAGNOSIS — Z0189 Encounter for other specified special examinations: Secondary | ICD-10-CM

## 2017-04-12 DIAGNOSIS — E785 Hyperlipidemia, unspecified: Secondary | ICD-10-CM | POA: Diagnosis present

## 2017-04-12 DIAGNOSIS — E875 Hyperkalemia: Secondary | ICD-10-CM | POA: Diagnosis not present

## 2017-04-12 DIAGNOSIS — Z978 Presence of other specified devices: Secondary | ICD-10-CM

## 2017-04-12 DIAGNOSIS — E1042 Type 1 diabetes mellitus with diabetic polyneuropathy: Secondary | ICD-10-CM | POA: Diagnosis present

## 2017-04-12 DIAGNOSIS — E1040 Type 1 diabetes mellitus with diabetic neuropathy, unspecified: Secondary | ICD-10-CM | POA: Diagnosis present

## 2017-04-12 DIAGNOSIS — Z86010 Personal history of colonic polyps: Secondary | ICD-10-CM

## 2017-04-12 DIAGNOSIS — R402142 Coma scale, eyes open, spontaneous, at arrival to emergency department: Secondary | ICD-10-CM | POA: Diagnosis present

## 2017-04-12 DIAGNOSIS — Z01818 Encounter for other preprocedural examination: Secondary | ICD-10-CM

## 2017-04-12 DIAGNOSIS — Z8041 Family history of malignant neoplasm of ovary: Secondary | ICD-10-CM

## 2017-04-12 DIAGNOSIS — R339 Retention of urine, unspecified: Secondary | ICD-10-CM

## 2017-04-12 DIAGNOSIS — Z800 Family history of malignant neoplasm of digestive organs: Secondary | ICD-10-CM

## 2017-04-12 DIAGNOSIS — K219 Gastro-esophageal reflux disease without esophagitis: Secondary | ICD-10-CM | POA: Diagnosis present

## 2017-04-12 DIAGNOSIS — N390 Urinary tract infection, site not specified: Secondary | ICD-10-CM

## 2017-04-12 DIAGNOSIS — M25552 Pain in left hip: Secondary | ICD-10-CM

## 2017-04-12 DIAGNOSIS — E559 Vitamin D deficiency, unspecified: Secondary | ICD-10-CM | POA: Diagnosis present

## 2017-04-12 DIAGNOSIS — E1039 Type 1 diabetes mellitus with other diabetic ophthalmic complication: Secondary | ICD-10-CM

## 2017-04-12 DIAGNOSIS — Z7982 Long term (current) use of aspirin: Secondary | ICD-10-CM

## 2017-04-12 DIAGNOSIS — Z8249 Family history of ischemic heart disease and other diseases of the circulatory system: Secondary | ICD-10-CM

## 2017-04-12 DIAGNOSIS — Z91048 Other nonmedicinal substance allergy status: Secondary | ICD-10-CM

## 2017-04-12 DIAGNOSIS — I5032 Chronic diastolic (congestive) heart failure: Secondary | ICD-10-CM | POA: Diagnosis present

## 2017-04-12 HISTORY — DX: Sepsis, unspecified organism: A41.9

## 2017-04-12 LAB — I-STAT VENOUS BLOOD GAS, ED
ACID-BASE DEFICIT: 7 mmol/L — AB (ref 0.0–2.0)
Bicarbonate: 16.8 mmol/L — ABNORMAL LOW (ref 20.0–28.0)
O2 Saturation: 87 %
TCO2: 18 mmol/L — ABNORMAL LOW (ref 22–32)
pCO2, Ven: 27.8 mmHg — ABNORMAL LOW (ref 44.0–60.0)
pH, Ven: 7.388 (ref 7.250–7.430)
pO2, Ven: 53 mmHg — ABNORMAL HIGH (ref 32.0–45.0)

## 2017-04-12 LAB — CBG MONITORING, ED
GLUCOSE-CAPILLARY: 420 mg/dL — AB (ref 65–99)
GLUCOSE-CAPILLARY: 392 mg/dL — AB (ref 65–99)
Glucose-Capillary: 392 mg/dL — ABNORMAL HIGH (ref 65–99)

## 2017-04-12 LAB — COMPREHENSIVE METABOLIC PANEL
ALT: 86 U/L — ABNORMAL HIGH (ref 14–54)
ANION GAP: 20 — AB (ref 5–15)
AST: 93 U/L — ABNORMAL HIGH (ref 15–41)
Albumin: 3 g/dL — ABNORMAL LOW (ref 3.5–5.0)
Alkaline Phosphatase: 89 U/L (ref 38–126)
BUN: 60 mg/dL — ABNORMAL HIGH (ref 6–20)
CHLORIDE: 102 mmol/L (ref 101–111)
CO2: 17 mmol/L — AB (ref 22–32)
Calcium: 10.1 mg/dL (ref 8.9–10.3)
Creatinine, Ser: 5.61 mg/dL — ABNORMAL HIGH (ref 0.44–1.00)
GFR calc Af Amer: 9 mL/min — ABNORMAL LOW (ref >60)
GFR calc non Af Amer: 8 mL/min — ABNORMAL LOW (ref >60)
Glucose, Bld: 400 mg/dL — ABNORMAL HIGH (ref 65–99)
POTASSIUM: 5.2 mmol/L — AB (ref 3.5–5.1)
SODIUM: 139 mmol/L (ref 135–145)
TOTAL PROTEIN: 7 g/dL (ref 6.5–8.1)
Total Bilirubin: 1.3 mg/dL — ABNORMAL HIGH (ref 0.3–1.2)

## 2017-04-12 LAB — URINALYSIS, ROUTINE W REFLEX MICROSCOPIC
Bilirubin Urine: NEGATIVE
GLUCOSE, UA: NEGATIVE mg/dL
KETONES UR: NEGATIVE mg/dL
Nitrite: NEGATIVE
PROTEIN: 100 mg/dL — AB
Specific Gravity, Urine: 1.011 (ref 1.005–1.030)
Squamous Epithelial / LPF: NONE SEEN
pH: 8 (ref 5.0–8.0)

## 2017-04-12 LAB — BASIC METABOLIC PANEL
ANION GAP: 15 (ref 5–15)
BUN: 66 mg/dL — AB (ref 6–20)
CHLORIDE: 110 mmol/L (ref 101–111)
CO2: 16 mmol/L — ABNORMAL LOW (ref 22–32)
Calcium: 9.4 mg/dL (ref 8.9–10.3)
Creatinine, Ser: 5.34 mg/dL — ABNORMAL HIGH (ref 0.44–1.00)
GFR calc Af Amer: 10 mL/min — ABNORMAL LOW (ref >60)
GFR calc non Af Amer: 9 mL/min — ABNORMAL LOW (ref >60)
GLUCOSE: 329 mg/dL — AB (ref 65–99)
POTASSIUM: 3.5 mmol/L (ref 3.5–5.1)
Sodium: 141 mmol/L (ref 135–145)

## 2017-04-12 LAB — CBC WITH DIFFERENTIAL/PLATELET
BASOS ABS: 0 10*3/uL (ref 0.0–0.1)
BASOS PCT: 0 %
Eosinophils Absolute: 0 10*3/uL (ref 0.0–0.7)
Eosinophils Relative: 0 %
HCT: 37.5 % (ref 36.0–46.0)
HEMOGLOBIN: 12.2 g/dL (ref 12.0–15.0)
LYMPHS PCT: 8 %
Lymphs Abs: 1.1 10*3/uL (ref 0.7–4.0)
MCH: 29.8 pg (ref 26.0–34.0)
MCHC: 32.5 g/dL (ref 30.0–36.0)
MCV: 91.7 fL (ref 78.0–100.0)
MONOS PCT: 8 %
Monocytes Absolute: 1.1 10*3/uL — ABNORMAL HIGH (ref 0.1–1.0)
NEUTROS ABS: 11.6 10*3/uL — AB (ref 1.7–7.7)
Neutrophils Relative %: 84 %
Platelets: 230 10*3/uL (ref 150–400)
RBC: 4.09 MIL/uL (ref 3.87–5.11)
RDW: 15.6 % — ABNORMAL HIGH (ref 11.5–15.5)
WBC: 13.8 10*3/uL — ABNORMAL HIGH (ref 4.0–10.5)

## 2017-04-12 LAB — PROTIME-INR
INR: 1.53
Prothrombin Time: 18.3 seconds — ABNORMAL HIGH (ref 11.4–15.2)

## 2017-04-12 LAB — I-STAT TROPONIN, ED: TROPONIN I, POC: 0 ng/mL (ref 0.00–0.08)

## 2017-04-12 LAB — GLUCOSE, CAPILLARY
GLUCOSE-CAPILLARY: 239 mg/dL — AB (ref 65–99)
GLUCOSE-CAPILLARY: 272 mg/dL — AB (ref 65–99)
GLUCOSE-CAPILLARY: 306 mg/dL — AB (ref 65–99)
GLUCOSE-CAPILLARY: 329 mg/dL — AB (ref 65–99)

## 2017-04-12 LAB — I-STAT CG4 LACTIC ACID, ED
LACTIC ACID, VENOUS: 3.56 mmol/L — AB (ref 0.5–1.9)
Lactic Acid, Venous: 2.83 mmol/L (ref 0.5–1.9)

## 2017-04-12 LAB — PROCALCITONIN: Procalcitonin: >150 ng/mL

## 2017-04-12 LAB — CORTISOL: Cortisol, Plasma: 46.1 ug/dL

## 2017-04-12 LAB — CK: CK TOTAL: 3191 U/L — AB (ref 38–234)

## 2017-04-12 MED ORDER — DEXTROSE 5 % IV SOLN: 250.0000 mg | INTRAVENOUS | Status: DC

## 2017-04-12 MED ORDER — INSULIN REGULAR HUMAN 100 UNIT/ML IJ SOLN
INTRAMUSCULAR | Status: DC
  Administered 2017-04-12: 3.3 [IU]/h via INTRAVENOUS
  Administered 2017-04-13: 2.4 [IU]/h via INTRAVENOUS
  Filled 2017-04-12 (×2): qty 1

## 2017-04-12 MED ORDER — FAMOTIDINE IN NACL 20-0.9 MG/50ML-% IV SOLN: 20.0000 mg | INTRAVENOUS | Status: DC

## 2017-04-12 MED ORDER — INSULIN REGULAR BOLUS VIA INFUSION
0.0000 [IU] | Freq: Three times a day (TID) | INTRAVENOUS | Status: DC
  Filled 2017-04-12: qty 10

## 2017-04-12 MED ORDER — SODIUM CHLORIDE 0.9 % IV SOLN
1.0000 g | INTRAVENOUS | Status: DC
  Administered 2017-04-12: 1 g via INTRAVENOUS
  Filled 2017-04-12: qty 1

## 2017-04-12 MED ORDER — SODIUM CHLORIDE 0.9 % IV SOLN
INTRAVENOUS | Status: DC
  Administered 2017-04-12 – 2017-04-13 (×3): via INTRAVENOUS

## 2017-04-12 MED ORDER — DEXTROSE 50 % IV SOLN
25.0000 mL | INTRAVENOUS | Status: DC | PRN
  Administered 2017-04-23: 25 mL via INTRAVENOUS
  Filled 2017-04-12 (×2): qty 50

## 2017-04-12 MED ORDER — SODIUM CHLORIDE 0.9 % IV BOLUS (SEPSIS)
1000.0000 mL | Freq: Once | INTRAVENOUS | Status: AC
Start: 2017-04-12 — End: 2017-04-12
  Administered 2017-04-12: 1000 mL via INTRAVENOUS

## 2017-04-12 MED ORDER — SODIUM CHLORIDE 0.9 % IV SOLN: 250.0000 mL | INTRAVENOUS | Status: DC | PRN

## 2017-04-12 MED ORDER — SODIUM CHLORIDE 0.9 % IV SOLN
2.0000 g | Freq: Once | INTRAVENOUS | Status: AC
  Administered 2017-04-12: 2 g via INTRAVENOUS
  Filled 2017-04-12: qty 2

## 2017-04-12 MED ORDER — DEXTROSE-NACL 5-0.45 % IV SOLN
INTRAVENOUS | Status: DC
  Administered 2017-04-12: via INTRAVENOUS

## 2017-04-12 MED ORDER — SODIUM CHLORIDE 0.9 % IV BOLUS (SEPSIS)
1000.0000 mL | Freq: Once | INTRAVENOUS | Status: AC
  Administered 2017-04-12: 1000 mL via INTRAVENOUS

## 2017-04-12 MED ORDER — FAMOTIDINE IN NACL 20-0.9 MG/50ML-% IV SOLN
20.0000 mg | Freq: Two times a day (BID) | INTRAVENOUS | Status: DC
  Administered 2017-04-12: 20 mg via INTRAVENOUS
  Filled 2017-04-12: qty 50

## 2017-04-12 NOTE — ED Provider Notes (Signed)
[facility] EMERGENCY DEPARTMENT Provider Note   CSN: [id number] Arrival date & time: 04/12/17  1217     History   Chief Complaint No chief complaint on file.   HPI Debra [name] is a 52 y.o. female.  The history is provided by the EMS personnel and a relative. No language interpreter was used.    Debra [name] is a 52 y.o. female who presents to the Emergency Department complaining of AMS.  She presents via EMS for evaluation of altered mental status.  Level 5 caveat due to unresponsiveness.  She has a history of multiple sclerosis and is currently residing in a nursing home.  She has a Foley catheter chronically and cannot ambulate at baseline.  She is typically conversant and alert.  Unknown last known well time but her aunt states that she has been conversant over the last weekend.  Per nursing facility she had a change in her mental status at some point last night.  No reports of fevers or vomiting.  Past Medical History:  Diagnosis Date  . Adenomatous colonic polyps   . Anemia    2005  . Anxiety    1990  . Arthritis   . Asthma    2000  . Cataract   . Depression with psychotic symptoms   . Difficult intubation    narrow airway  . Gastroesophageal Reflux Disease (GERD)   . Heart murmur    Birth  . Hyperlipidemia    2005  . Hypertension    1998  . Internal hemorrhoids 04/24/06   on colonoscopy  . Migraine   . Multiple sclerosis (HCC)   . Neuropathic bladder   . Neuropathy of the hands & feet   . Restless Leg Syndrome   . Right Ankle Fracture 10/06/2013  . Sleep paralysis   . Stable angina pectoris    2007: cath showing normal cors.   . Stroke 1990  . Type I Diabetes Mellitus 1988    Patient Active Problem List   Diagnosis Date Noted  . Sepsis due to urinary tract infection (HCC) 04/12/2017  . Encephalopathy 03/15/2017  . Aortic atherosclerosis (HCC) 03/14/2017  . Altered mental status 03/14/2017  . Urinary tract infection  03/14/2017  . Major depressive disorder, single episode, severe without psychosis (HCC) 01/02/2017  . Multiple sclerosis exacerbation (HCC) 01/01/2017  . Dysthymia 12/27/2016  . Major depressive disorder, recurrent, severe with psychotic features (HCC)   . Multiple joint pain   . HTN (hypertension)   . Acute pain of left knee   . Neurogenic bladder   . Acute lower UTI   . Labile blood pressure   . Acute blood loss anemia   . Leukocytosis   . Labile blood glucose   . Hypertensive crisis   . Hypoglycemia   . Poorly controlled type 2 diabetes mellitus with peripheral neuropathy (HCC)   . Uncontrolled hypertension   . Sleep disturbance   . Multiple sclerosis (HCC) 11/22/2016  . Thoracic root lesion 11/22/2016  . MS (multiple sclerosis) (HCC)   . Neuropathic pain   . Uncontrolled type 2 diabetes mellitus with peripheral neuropathy (HCC)   . Urinary retention   . Medically noncompliant   . Leg weakness, bilateral 11/20/2016  . Diabetic ketoacidosis without coma associated with type 1 diabetes mellitus (HCC)   . Leg pain   . History of CVA (cerebrovascular accident)   . Uncontrolled type 1 diabetes mellitus with diabetic peripheral neuropathy (HCC)   . Benign essential  HTN   . Hypertensive urgency   . AKI (acute kidney injury) (HCC)   . Fibromyalgia 11/07/2016  . Back pain 10/31/2016  . Stable angina pectoris (HCC)   . Vitamin D deficiency 08/31/2014  . 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 Hyperlipidemia 10/06/2013  . Depression 10/16/2012  . Bilateral knee pain 02/16/2012  . Chronic diarrhea 10/19/2010  . Peripheral neuropathy 08/19/2010  . Type 1 diabetes mellitus (HCC) 07/06/2010  . Hypertension 07/06/2010  . Asthma 07/06/2010  . Migraine 07/06/2010    Past Surgical History:  Procedure Laterality Date  . bilateral foot surgery    . BREAST SURGERY Left    biopsy left breast  . CARDIAC CATHETERIZATION  2007   around 2007 or 2008  .  CESAREAN SECTION    . CHOLECYSTECTOMY    . COLONOSCOPY W/ BIOPSIES AND POLYPECTOMY    . DILATION AND CURETTAGE OF UTERUS  12/29/2010   Surgeon: Tereso Newcomer, MD;  Location: [facility];  Service: Gynecology  . ENDOMETRIAL ABLATION W/ NOVASURE N/A 12/2009  . EYE SURGERY    . HYSTEROSCOPY  12/29/2010   Procedure: HYSTEROSCOPY WITH HYDROTHERMAL ABLATION;  Surgeon: Tereso Newcomer, MD;  Location: [facility];  Service: Gynecology;;  . IUD REMOVAL  12/29/2010   Procedure: INTRAUTERINE DEVICE (IUD) REMOVAL;  Surgeon: Tereso Newcomer, MD;  Location: [facility];  Service: Gynecology;  Laterality: N/A;  . LAPAROSCOPIC TUBAL LIGATION  12/29/2010   Procedure: LAPAROSCOPIC TUBAL LIGATION;  Surgeon: Tereso Newcomer, MD;  Location: [facility];  Service: Gynecology;  Laterality: Bilateral;  . ORIF ANKLE FRACTURE Right 10/09/2013   Procedure: Open reduction internal fixation right ankle;  Surgeon: Mable Paris, MD;  Location: [facility] OR;  Service: Orthopedics;  Laterality: Right;  Open reduction internal fixation right ankle  . TRIGGER FINGER RELEASE     x 3    OB History    Gravida Para Term Preterm AB Living   2 2 1 1  0 2   SAB TAB Ectopic Multiple Live Births   0 0 0 0 2       Home Medications    Prior to Admission medications   Medication Sig Start Date End Date Taking? Authorizing Provider  acetaminophen (TYLENOL) 325 MG tablet Take 650 mg by mouth every 6 (six) hours as needed for mild pain or fever.    [provider]  amLODipine (NORVASC) 10 MG tablet Take 1 tablet (10 mg total) by mouth daily. 02/14/17 02/14/18  Angelita Ingles, MD  aspirin EC 81 MG tablet Take 1 tablet (81 mg total) by mouth daily. 02/14/17   Angelita Ingles, MD  atorvastatin (LIPITOR) 40 MG tablet Take 1 tablet (40 mg total) by mouth daily. 02/14/17   Angelita Ingles, MD  baclofen 5 MG TABS Take 5 mg by mouth 3 (three) times daily. 02/14/17   Angelita Ingles, MD  bethanechol (URECHOLINE) 50 MG tablet Take 1  tablet (50 mg total) by mouth 3 (three) times daily. 02/14/17   Angelita Ingles, MD  Blood Glucose Monitoring Suppl (AGAMATRIX PRESTO PRO METER) DEVI The patient is insulin requiring, ICD 10 code E10.9. The patient tests 4 times per day. 12/15/16   Angiulli, Mcarthur Rossetti, PA-C  ciprofloxacin (CIPRO) 500 MG tablet Take 1 tablet (500 mg total) by mouth 2 (two) times daily. 03/17/17   Santos-Sanchez, Chelsea Primus, MD  DULoxetine (CYMBALTA) 60 MG capsule Take 1 capsule (60 mg total) daily by mouth. 01/03/17  Gouru, Deanna Artis, MD  feeding supplement, GLUCERNA SHAKE, (GLUCERNA SHAKE) LIQD Take 237 mLs by mouth 2 (two) times daily between meals. 02/14/17   Angelita Ingles, MD  gabapentin (NEURONTIN) 100 MG capsule Take 1 capsule (100 mg total) 3 (three) times daily by mouth. Patient not taking: Reported on 03/14/2017 01/02/17   Ramonita Lab, MD  GLUCAGON HCL, RDNA, IJ Inject 1 mg into the muscle as needed (hypoglycemia-administer for CBG <60).    [provider]  heparin 5000 UNIT/ML injection Inject 5,000 Units into the skin daily.    [provider]  hydrALAZINE (APRESOLINE) 25 MG tablet Take 1 tablet (25 mg total) by mouth every 8 (eight) hours as needed (systolic goal 140). Patient not taking: Reported on 03/14/2017 02/14/17   Angelita Ingles, MD  insulin aspart (NOVOLOG) 100 UNIT/ML injection Inject 8 Units 3 (three) times daily with meals into the skin. Patient taking differently: Inject 8 Units into the skin See admin instructions. Pt uses 8 units subcutaneously three times daily before meals - [place] also uses sliding scale as needed throughout the day if 250-299=2 units, 300-349=3 units, 350-400=4 units >400 call MD 01/02/17   Ramonita Lab, MD  insulin aspart (NOVOLOG) 100 UNIT/ML injection Inject 0-15 Units into the skin 3 (three) times daily with meals. Patient not taking: Reported on 03/14/2017 02/14/17   Angelita Ingles, MD  insulin aspart (NOVOLOG) 100 UNIT/ML injection Inject  0-5 Units into the skin at bedtime. Patient not taking: Reported on 03/14/2017 02/14/17   Angelita Ingles, MD  insulin glargine (LANTUS) 100 UNIT/ML injection Inject 0.3 mLs (30 Units total) into the skin at bedtime. Patient taking differently: Inject 36 Units into the skin at bedtime.  02/14/17   Angelita Ingles, MD  lisinopril (PRINIVIL,ZESTRIL) 20 MG tablet Take 1 tablet (20 mg total) by mouth daily. 02/15/17   Angelita Ingles, MD  magnesium hydroxide (MILK OF MAGNESIA) 400 MG/5ML suspension Take 30 mLs by mouth daily as needed for mild constipation. Patient taking differently: Take 2,400 mLs by mouth every 3 (three) days as needed for mild constipation.  02/14/17   Angelita Ingles, MD  metoprolol succinate (TOPROL-XL) 100 MG 24 hr tablet Take 1 tablet (100 mg total) by mouth daily. Take with or immediately following a meal. 02/15/17   Angelita Ingles, MD  mirtazapine (REMERON) 45 MG tablet Take 1 tablet (45 mg total) by mouth at bedtime. 02/14/17   Angelita Ingles, MD  ondansetron (ZOFRAN) 4 MG tablet Take 4 mg by mouth daily.    [provider]  pantoprazole (PROTONIX) 40 MG tablet Take 1 tablet (40 mg total) by mouth daily. Patient not taking: Reported on 03/14/2017 02/15/17   Angelita Ingles, MD  polyethylene glycol [facility]) packet Take 17 g by mouth daily as needed for mild constipation, moderate constipation or severe constipation. 02/14/17   Angelita Ingles, MD  pregabalin (LYRICA) 100 MG capsule Take 1 capsule (100 mg total) by mouth 3 (three) times daily. 02/14/17   Angelita Ingles, MD  risperiDONE (RISPERDAL) 1 MG tablet Take 1 tablet (1 mg total) at bedtime by mouth. Patient not taking: Reported on 03/14/2017 01/02/17   Ramonita Lab, MD  tamsulosin (FLOMAX) 0.4 MG CAPS capsule Take 1 capsule (0.4 mg total) daily by mouth. Patient not taking: Reported on 03/14/2017 01/03/17   Ramonita Lab, MD  traMADol (ULTRAM) 50 MG tablet Take 1 tablet (50 mg  total) by mouth every 6 (six)  hours as needed for moderate pain or severe pain. 02/14/17   Angelita Ingles, MD  Vitamin D, Ergocalciferol, (DRISDOL) 50000 units CAPS capsule Take 1 capsule (50,000 Units total) by mouth every 7 (seven) days. 02/15/17   Angelita Ingles, MD  zolpidem (AMBIEN) 5 MG tablet Take 1 tablet (5 mg total) by mouth at bedtime as needed for sleep. Patient not taking: Reported on 03/14/2017 02/14/17   Angelita Ingles, MD    Family History Family History  Problem Relation Age of Onset  . Hypertension Mother   . Kidney disease Mother   . Hypertension Father   . Breast cancer Maternal Grandmother   . Prostate cancer Maternal Grandfather   . Ovarian cancer Paternal Grandmother   . Prostate cancer Paternal Grandfather   . Colon cancer Maternal Uncle        Family history of malignant neoplasm of gastrointestinal tract    Social History Social History   Tobacco Use  . Smoking status: Never Smoker  . Smokeless tobacco: Never Used  Substance Use Topics  . Alcohol use: No    Alcohol/week: 0.0 oz  . Drug use: No    Comment: drug addict     Allergies   Penicillins; Pollen extract; and Tape   Review of Systems Review of Systems  Unable to perform ROS: Patient unresponsive     Physical Exam Updated Vital Signs BP 139/69   Pulse 85   Temp 100.1 F (37.8 C) (Rectal)   Resp 12   SpO2 100%   Physical Exam  Constitutional: She appears well-developed and well-nourished.  HENT:  Head: Normocephalic and atraumatic.  Eyes: Pupils are equal, round, and reactive to light.  Resists eye opening.  Cardiovascular: Normal rate and regular rhythm.  No murmur heard. Pulmonary/Chest: Effort normal and breath sounds normal. No respiratory distress.  Abdominal: There is tenderness.  Distended lower abdomen.  Grimaces to palpation.  Neurological:  Unresponsive.  Grimaces to painful stimuli.  Skin: Skin is warm and dry. Capillary refill takes less than 2  seconds.  Psychiatric:  Unable to assess  Nursing note and vitals reviewed.    ED Treatments / Results  Labs (all labs ordered are listed, but only abnormal results are displayed) Labs Reviewed  COMPREHENSIVE METABOLIC PANEL - Abnormal; Notable for the following components:      Result Value   Potassium 5.2 (*)    CO2 17 (*)    Glucose, Bld 400 (*)    BUN 60 (*)    Creatinine, Ser 5.61 (*)    Albumin 3.0 (*)    AST 93 (*)    ALT 86 (*)    Total Bilirubin 1.3 (*)    GFR calc non Af Amer 8 (*)    GFR calc Af Amer 9 (*)    Anion gap 20 (*)    All other components within normal limits  CBC WITH DIFFERENTIAL/PLATELET - Abnormal; Notable for the following components:   WBC 13.8 (*)    RDW 15.6 (*)    Neutro Abs 11.6 (*)    Monocytes Absolute 1.1 (*)    All other components within normal limits  URINALYSIS, ROUTINE W REFLEX MICROSCOPIC - Abnormal; Notable for the following components:   Color, Urine AMBER (*)    APPearance CLOUDY (*)    Hgb urine dipstick LARGE (*)    Protein, ur 100 (*)    Leukocytes, UA LARGE (*)    Bacteria, UA MANY (*)    All other components  within normal limits  PROTIME-INR - Abnormal; Notable for the following components:   Prothrombin Time 18.3 (*)    All other components within normal limits  I-STAT CG4 LACTIC ACID, ED - Abnormal; Notable for the following components:   Lactic Acid, Venous 3.56 (*)    All other components within normal limits  I-STAT VENOUS BLOOD GAS, ED - Abnormal; Notable for the following components:   pCO2, Ven 27.8 (*)    pO2, Ven 53.0 (*)    Bicarbonate 16.8 (*)    TCO2 18 (*)    Acid-base deficit 7.0 (*)    All other components within normal limits  I-STAT CG4 LACTIC ACID, ED - Abnormal; Notable for the following components:   Lactic Acid, Venous 2.83 (*)    All other components within normal limits  CBG MONITORING, ED - Abnormal; Notable for the following components:   Glucose-Capillary 392 (*)    All other  components within normal limits  CULTURE, BLOOD (ROUTINE X 2)  CULTURE, BLOOD (ROUTINE X 2)  URINE CULTURE  CREATININE, SERUM  PROCALCITONIN  CORTISOL  BASIC METABOLIC PANEL  I-STAT TROPONIN, ED    EKG  EKG Interpretation  Date/Time:  Thursday April 12 2017 12:23:34 EST Ventricular Rate:  85 PR Interval:    QRS Duration: 107 QT Interval:  381 QTC Calculation: 453 R Axis:   81 Text Interpretation:  Sinus rhythm Borderline short PR interval ST elev, probable normal early repol pattern Confirmed by Tilden Fossa [id number]) on 04/12/2017 12:26:15 PM       Radiology Ct Head Wo Contrast  Result Date: 04/12/2017 CLINICAL DATA:  Altered mental status. History of multiple sclerosis. EXAM: CT HEAD WITHOUT CONTRAST TECHNIQUE: Contiguous axial images were obtained from the base of the skull through the vertex without intravenous contrast. COMPARISON:  MRI brain dated March 14, 2017. CT head dated March 14, 2017. FINDINGS: Brain: No evidence of acute infarction, hemorrhage, hydrocephalus, extra-axial collection or mass lesion/mass effect. Scattered areas of hypodensity in the periventricular deep white matter are not significantly changed when compared to prior study, and are consistent with patient's history of multiple sclerosis. Vascular: Atherosclerotic vascular calcification of the carotid siphons. No hyperdense vessel. Skull: Normal. Negative for fracture or focal lesion. Sinuses/Orbits: Extensive paranasal sinus mucosal thickening. Small air-fluid level in the left sphenoid sinus. Findings are similar to prior study. No acute orbital finding. Other: None. IMPRESSION: 1.  No acute intracranial abnormality. 2. Stable findings related to multiple sclerosis. 3. Chronic paranasal sinus disease, not significantly changed. Electronically Signed   By: Obie Dredge M.D.   On: 04/12/2017 13:07   Dg Chest Port 1 View  Result Date: 04/12/2017 CLINICAL DATA:  Altered mental status EXAM:  PORTABLE CHEST 1 VIEW COMPARISON:  03/14/2017 FINDINGS: EKG leads create artifact across the chest. Chronic cardiomegaly. There is no edema, consolidation, effusion, or pneumothorax. IMPRESSION: 1. No evidence of active disease. 2. Chronic cardiomegaly. Electronically Signed   By: Marnee Spring M.D.   On: 04/12/2017 13:00    Procedures Procedures (including critical care time) CRITICAL CARE Performed by: Tilden Fossa   Total critical care time: 35 minutes  Critical care time was exclusive of separately billable procedures and treating other patients.  Critical care was necessary to treat or prevent imminent or life-threatening deterioration.  Critical care was time spent personally by me on the following activities: development of treatment plan with patient and/or surrogate as well as nursing, discussions with consultants, evaluation of patient's response to treatment, examination of  patient, obtaining history from patient or surrogate, ordering and performing treatments and interventions, ordering and review of laboratory studies, ordering and review of radiographic studies, pulse oximetry and re-evaluation of patient's condition.  Medications Ordered in ED Medications  ceFEPIme (MAXIPIME) 1 g in sodium chloride 0.9 % 100 mL IVPB (not administered)  insulin regular bolus via infusion 0-10 Units (not administered)  insulin regular (NOVOLIN R,HUMULIN R) 100 Units in sodium chloride 0.9 % 100 mL (1 Units/mL) infusion (not administered)  dextrose 50 % solution 25 mL (not administered)  0.9 %  sodium chloride infusion (not administered)  dextrose 5 %-0.45 % sodium chloride infusion (not administered)  0.9 %  sodium chloride infusion (not administered)  famotidine (PEPCID) IVPB 20 mg premix (not administered)  aztreonam (AZACTAM) 2 g in sodium chloride 0.9 % 100 mL IVPB (2 g Intravenous Restarted 04/12/17 1306)  sodium chloride 0.9 % bolus 1,000 mL (1,000 mLs Intravenous New Bag/Given  04/12/17 1249)  sodium chloride 0.9 % bolus 1,000 mL (1,000 mLs Intravenous New Bag/Given 04/12/17 1504)     Initial Impression / Assessment and Plan / ED Course  I have reviewed the triage vital signs and the nursing notes.  Pertinent labs & imaging results that were available during my care of the patient were reviewed by me and considered in my medical decision making (see chart for details).     Patient with history of multiple sclerosis, type 1 diabetes here for change in mental status.  She is minimally responsive on examination but protecting her airway.  She did have significant urinary retention with Foley catheter that was not draining on ED arrival.  A Foley catheter was exchanged and she had about 1 L or more of urine returned.  On repeat abdominal examination following a Foley catheter placement her abdomen was soft with no appreciable tenderness.  Labs demonstrate acute renal failure with rising creatinine to greater than 5, hyperglycemia with anion gap of 20.  She was treated with aggressive IV fluid hydration and put on glucose stabilizer.  She was treated with IV antibiotics for possible urinary tract infection.  Critical-care consulted for admission for further treatment.  Final Clinical Impressions(s) / ED Diagnoses   Final diagnoses:  Acute encephalopathy  Urinary retention    ED Discharge Orders    None       Tilden Fossa, MD 04/12/17 1626

## 2017-04-12 NOTE — Procedures (Signed)
  ELECTROENCEPHALOGRAM REPORT  Date of Study: 04/12/17  Patient's Name: Debra [name] MRN: [id number] Date of Birth: 11-04-1965  Referring Provider:  Caryl Pina, MD  Clinical History: [name] is a 52 y.o. F [name] is a 52 y.o. female who presents to the Emergency Department complaining of AMS.  She presents via EMS for evaluation of altered mental status.  Level 5 caveat due to unresponsiveness.  She has a history of multiple sclerosis and is currently residing in a nursing home.  She has a Foley catheter chronically and cannot ambulate at baseline.  She is typically conversant and alert.  Unknown last known well time but her aunt states that she has been conversant over the last weekend.  Per nursing facility she had a change in her mental status at some point last night.  No reports of fevers or vomiting.    Medications: Scheduled Meds: . insulin regular  0-10 Units Intravenous TID WC   Continuous Infusions: . sodium chloride 125 mL/hr at 04/12/17 1625  . sodium chloride    . ceFEPime (MAXIPIME) IV Stopped (04/12/17 2307)  . dextrose 5 % and 0.45% NaCl Stopped (04/12/17 1847)  . [START ON 04/13/2017] famotidine (PEPCID) IV    . insulin (NOVOLIN-R) infusion 6.4 Units/hr (04/12/17 2235)   PRN Meds:.sodium chloride, dextrose            Technical Summary: This is a standard 16 channel EEG recording performed according to the international 10-20 electrode system.  AP bipolar, transverse bipolar, and referential montages were obtained, and digitally reformatted as necessary.  Duration of tracing: 21:54  Description: The patient is described as being unresponsive and not able to follow commands.   Background is disorganized with a 4-5 Hz theta rhythm seen posteriorly, and intermittent frontal 2-3 Hz activity.  There is no well defined drowsiness or stage 2 sleep identified.  Intermittent generalized periodic discharges (GPDs), which are most prominent  in the centro-parietal regions, are seen at an irregular frequency during the tracing, without a clinical correlate.  At times, interdischarge relative voltage suppression is noted.  Neither HV or photic stimulation were performed.  EKG was monitored and noted to be sinus tachycardia at 102 bpm.    Impression: This is an abnormal EEG due to background slowing/disorganization, along with intermittent generalized periodic discharges (GPD's).  The slowing is a non-specific abnormality that can be seen with toxic, metabolic, diffuse, or multifocal structural abnormalities.   GPDs are associated with brain dysfunction and also non-specific in regards to etiology, being seen most commonly with toxic-metabolic encephalopathies, but can also be seen with hypoxic/ischemic, infectious, traumatic, and occasionally epileptic etiologies.   Although no definite epileptiform activity was seen during this study, intermittent seizures are not excluded.   Continuous EEG monitoring recommended if there is clinical suspicion for seizures.   Beryle Beams, M.D. Neurology Cell [phone]

## 2017-04-12 NOTE — Progress Notes (Signed)
Pharmacy Antibiotic Note  Debra [name] is a 52 y.o. female admitted on 04/12/2017 with UTI.  Pharmacy has been consulted for Cefepime dosing. WBC elevated at 13.8. SCr elevated at 5.61 (BL ~ 1.16). CrCl ~ 10-15 mL/min.   Plan: -Cefepime 1 gm IV Q 24 hours -Monitor CBC, renal fx cultures and clinical progress     Temp (24hrs), Avg:100.1 F (37.8 C), Min:100.1 F (37.8 C), Max:100.1 F (37.8 C)  Recent Labs  Lab 04/12/17 1235 04/12/17 1324  WBC 13.8*  --   LATICACIDVEN  --  3.56*    CrCl cannot be calculated (Patient's most recent lab result is older than the maximum 21 days allowed.).    Allergies  Allergen Reactions  . Penicillins Anaphylaxis, Nausea And Vomiting and Rash    Has patient had a PCN reaction causing immediate rash, facial/tongue/throat swelling, SOB or lightheadedness with hypotension: Yes Has patient had a PCN reaction causing severe rash involving mucus membranes or skin necrosis: Yes Has patient had a PCN reaction that required hospitalization No Has patient had a PCN reaction occurring within the last 10 years: Yes If all of the above answers are "NO", then may proceed with Cephalosporin use.   . Pollen Extract Other (See Comments)    Seasonal Allergies  . Tape Rash    Antimicrobials this admission: Cefepime 2/14 >>   Dose adjustments this admission: None  Microbiology results: 2/14 BCx:    Thank you for allowing pharmacy to be a part of this patient's care.  Vinnie Level, PharmD., BCPS Clinical Pharmacist Pager [phone]

## 2017-04-12 NOTE — ED Notes (Signed)
Lactic acid result given to Dr. Rees 

## 2017-04-12 NOTE — ED Notes (Signed)
Lab results Reported to Nurse Kathlene November.

## 2017-04-12 NOTE — H&P (Signed)
PULMONARY / CRITICAL CARE MEDICINE   Name: Debra [name] MRN: [id number] DOB: Jul 28, 1965    ADMISSION DATE:  04/12/2017 CONSULTATION DATE:  04/13/17   CHIEF COMPLAINT:  Altered mental startus  HISTORY OF PRESENT ILLNESS:   The patient was transferred from her NH facility too the ED for altered mental status.The patient is quite obtunded presently. This appears to have started earlier today. She has fairly advanced Multiple sclerosis.The  Patient has a neurogenic bkladder and has been subject to UTIs in the past The patient is not ambulatory without help.She does eat normally as she does niot have a feeding tube The patient has her eyes open. Does not respond to my voice. Does withdraw to noxious stimuli.Has a decreased gag reflex.Will not follow commands.tHERE WAS NO WITNESSED SEIZURE ACTIVITY ANDS THE PATIENT DOES NOT HAVE THAT  History. When the patient arrived in the ED her indwelling foley bag was empty.The catheter was changed and than the patient had a large output of somewhat bloody urine. Her BP was a little "soft" on presentation but came up to the normal ran ge after 2 liters of IV fluid.her creatinine is 5 (was 1+ less than 1 month ago) Her blood sugar is >400. The patient has dry mucosae and poor skin turgor. Her AG was 20-22. Bicarb was decreased to 17.The patient has a history of type 1 DM.o2 sat is 100% on RA.her urien showed many bacteria, WBCS, large Hb, 2+ PROTEINURIA.      PAST MEDICAL HISTORY :  She  has a past medical history of Adenomatous colonic polyps, Anemia, Anxiety, Arthritis, Asthma, Cataract, Depression with psychotic symptoms, Difficult intubation, Gastroesophageal Reflux Disease (GERD), Heart murmur, Hyperlipidemia, Hypertension, Internal hemorrhoids (04/24/06), Migraine, Multiple sclerosis (HCC), Neuropathic bladder, Neuropathy of the hands & feet, Restless Leg Syndrome, Right Ankle Fracture (10/06/2013), Sleep paralysis, Stable angina pectoris, Stroke  (1990), and Type I Diabetes Mellitus (1988).  PAST SURGICAL HISTORY: She  has a past surgical history that includes Cholecystectomy; Trigger finger release; Eye surgery; Cesarean section; bilateral foot surgery; Laparoscopic tubal ligation (12/29/2010); IUD removal (12/29/2010); Hysteroscopy (12/29/2010); Dilation and curettage of uterus (12/29/2010); Endometrial ablation w/ novasure (N/A, 12/2009); Cardiac catheterization (2007); Breast surgery (Left); Colonoscopy w/ biopsies and polypectomy; and ORIF ankle fracture (Right, 10/09/2013).  Allergies  Allergen Reactions  . Penicillins Anaphylaxis, Nausea And Vomiting and Rash    Has patient had a PCN reaction causing immediate rash, facial/tongue/throat swelling, SOB or lightheadedness with hypotension: Yes Has patient had a PCN reaction causing severe rash involving mucus membranes or skin necrosis: Yes Has patient had a PCN reaction that required hospitalization No Has patient had a PCN reaction occurring within the last 10 years: Yes If all of the above answers are "NO", then may proceed with Cephalosporin use.   . Pollen Extract Other (See Comments)    Seasonal Allergies  . Tape Rash    No current facility-administered medications on file prior to encounter.    Current Outpatient Medications on File Prior to Encounter  Medication Sig  . acetaminophen (TYLENOL) 325 MG tablet Take 650 mg by mouth every 6 (six) hours as needed for mild pain or fever.  Marland Kitchen amLODipine (NORVASC) 10 MG tablet Take 1 tablet (10 mg total) by mouth daily.  Marland Kitchen aspirin EC 81 MG tablet Take 1 tablet (81 mg total) by mouth daily.  Marland Kitchen atorvastatin (LIPITOR) 40 MG tablet Take 1 tablet (40 mg total) by mouth daily.  . baclofen 5 MG TABS Take 5 mg by mouth  3 (three) times daily.  . bethanechol (URECHOLINE) 50 MG tablet Take 1 tablet (50 mg total) by mouth 3 (three) times daily.  . Blood Glucose Monitoring Suppl (AGAMATRIX PRESTO PRO METER) DEVI The patient is insulin requiring,  ICD 10 code E10.9. The patient tests 4 times per day.  . ciprofloxacin (CIPRO) 500 MG tablet Take 1 tablet (500 mg total) by mouth 2 (two) times daily.  . DULoxetine (CYMBALTA) 60 MG capsule Take 1 capsule (60 mg total) daily by mouth.  . feeding supplement, GLUCERNA SHAKE, (GLUCERNA SHAKE) LIQD Take 237 mLs by mouth 2 (two) times daily between meals.  . gabapentin (NEURONTIN) 100 MG capsule Take 1 capsule (100 mg total) 3 (three) times daily by mouth. (Patient not taking: Reported on 03/14/2017)  . GLUCAGON HCL, RDNA, IJ Inject 1 mg into the muscle as needed (hypoglycemia-administer for CBG <60).  . heparin 5000 UNIT/ML injection Inject 5,000 Units into the skin daily.  . hydrALAZINE (APRESOLINE) 25 MG tablet Take 1 tablet (25 mg total) by mouth every 8 (eight) hours as needed (systolic goal 140). (Patient not taking: Reported on 03/14/2017)  . insulin aspart (NOVOLOG) 100 UNIT/ML injection Inject 8 Units 3 (three) times daily with meals into the skin. (Patient taking differently: Inject 8 Units into the skin See admin instructions. Pt uses 8 units subcutaneously three times daily before meals - [place] also uses sliding scale as needed throughout the day if 250-299=2 units, 300-349=3 units, 350-400=4 units >400 call MD)  . insulin aspart (NOVOLOG) 100 UNIT/ML injection Inject 0-15 Units into the skin 3 (three) times daily with meals. (Patient not taking: Reported on 03/14/2017)  . insulin aspart (NOVOLOG) 100 UNIT/ML injection Inject 0-5 Units into the skin at bedtime. (Patient not taking: Reported on 03/14/2017)  . insulin glargine (LANTUS) 100 UNIT/ML injection Inject 0.3 mLs (30 Units total) into the skin at bedtime. (Patient taking differently: Inject 36 Units into the skin at bedtime. )  . lisinopril (PRINIVIL,ZESTRIL) 20 MG tablet Take 1 tablet (20 mg total) by mouth daily.  . magnesium hydroxide (MILK OF MAGNESIA) 400 MG/5ML suspension Take 30 mLs by mouth daily as needed for mild constipation.  (Patient taking differently: Take 2,400 mLs by mouth every 3 (three) days as needed for mild constipation. )  . metoprolol succinate (TOPROL-XL) 100 MG 24 hr tablet Take 1 tablet (100 mg total) by mouth daily. Take with or immediately following a meal.  . mirtazapine (REMERON) 45 MG tablet Take 1 tablet (45 mg total) by mouth at bedtime.  . ondansetron (ZOFRAN) 4 MG tablet Take 4 mg by mouth daily.  . pantoprazole (PROTONIX) 40 MG tablet Take 1 tablet (40 mg total) by mouth daily. (Patient not taking: Reported on 03/14/2017)  . polyethylene glycol (MIRALAX / GLYCOLAX) packet Take 17 g by mouth daily as needed for mild constipation, moderate constipation or severe constipation.  . pregabalin (LYRICA) 100 MG capsule Take 1 capsule (100 mg total) by mouth 3 (three) times daily.  . risperiDONE (RISPERDAL) 1 MG tablet Take 1 tablet (1 mg total) at bedtime by mouth. (Patient not taking: Reported on 03/14/2017)  . tamsulosin (FLOMAX) 0.4 MG CAPS capsule Take 1 capsule (0.4 mg total) daily by mouth. (Patient not taking: Reported on 03/14/2017)  . traMADol (ULTRAM) 50 MG tablet Take 1 tablet (50 mg total) by mouth every 6 (six) hours as needed for moderate pain or severe pain.  . Vitamin D, Ergocalciferol, (DRISDOL) 50000 units CAPS capsule Take 1 capsule (50,000 Units  total) by mouth every 7 (seven) days.  Marland Kitchen zolpidem (AMBIEN) 5 MG tablet Take 1 tablet (5 mg total) by mouth at bedtime as needed for sleep. (Patient not taking: Reported on 03/14/2017)    FAMILY HISTORY:  Her indicated that her mother is deceased. She indicated that her father is alive. She indicated that her maternal grandmother is alive. She indicated that her maternal grandfather is deceased. She indicated that her paternal grandmother is deceased. She indicated that her paternal grandfather is deceased. She indicated that the status of her maternal uncle is unknown.   SOCIAL HISTORY: She  reports that  has never smoked. she has never used  smokeless tobacco. She reports that she does not drink alcohol or use drugs.  REVIEW OF SYSTEMS:   Not available as patient is obtunded     VITAL SIGNS: BP 139/69   Pulse 85   Temp 100.1 F (37.8 C) (Rectal)   Resp 12   SpO2 100%            INTAKE / OUTPUT: No intake/output data recorded.  PHYSICAL EXAMINATION: General:  Patient appears acutely ill and poorly responsive Neuro:  Not moving presently, Eyes open. Withdraws to painful stimuli. Does not respond to her name HEENT:  [place]?AT, sclearae anicteric, PERLLA Cardiovascular:  RRR,S1S2, gr 3/6 systolic murmur at the LLSB radiating to the apex Lungs:  Clear Abdomen:  Soft, non-tender Musculoskeletal:  Appears to be normal. Skin:  Appears intact, poor turgor, mucosae dryExtr:: no swelling in sacral or legs  LABS:  BMET Recent Labs  Lab 04/12/17 1235  NA 139  K 5.2*  CL 102  CO2 17*  BUN 60*  CREATININE 5.61*  GLUCOSE 400*    Electrolytes Recent Labs  Lab 04/12/17 1235  CALCIUM 10.1    CBC Recent Labs  Lab 04/12/17 1235  WBC 13.8*  HGB 12.2  HCT 37.5  PLT 230    Coag's Recent Labs  Lab 04/12/17 1519  INR 1.53    Sepsis Markers Recent Labs  Lab 04/12/17 1324 04/12/17 1534  LATICACIDVEN 3.56* 2.83*    ABG No results for input(s): PHART, PCO2ART, PO2ART in the last 168 hours.  Liver Enzymes Recent Labs  Lab 04/12/17 1235  AST 93*  ALT 86*  ALKPHOS 89  BILITOT 1.3*  ALBUMIN 3.0*    Cardiac Enzymes No results for input(s): TROPONINI, PROBNP in the last 168 hours.  Glucose Recent Labs  Lab 04/12/17 1601  GLUCAP 392*    Imaging Ct Head Wo Contrast  Result Date: 04/12/2017 CLINICAL DATA:  Altered mental status. History of multiple sclerosis. EXAM: CT HEAD WITHOUT CONTRAST TECHNIQUE: Contiguous axial images were obtained from the base of the skull through the vertex without intravenous contrast. COMPARISON:  MRI brain dated March 14, 2017. CT head dated March 14, 2017.  FINDINGS: Brain: No evidence of acute infarction, hemorrhage, hydrocephalus, extra-axial collection or mass lesion/mass effect. Scattered areas of hypodensity in the periventricular deep white matter are not significantly changed when compared to prior study, and are consistent with patient's history of multiple sclerosis. Vascular: Atherosclerotic vascular calcification of the carotid siphons. No hyperdense vessel. Skull: Normal. Negative for fracture or focal lesion. Sinuses/Orbits: Extensive paranasal sinus mucosal thickening. Small air-fluid level in the left sphenoid sinus. Findings are similar to prior study. No acute orbital finding. Other: None. IMPRESSION: 1.  No acute intracranial abnormality. 2. Stable findings related to multiple sclerosis. 3. Chronic paranasal sinus disease, not significantly changed. Electronically Signed   By: Chrissie Noa  Howell Pringle M.D.   On: 04/12/2017 13:07   Dg Chest Port 1 View  Result Date: 04/12/2017 CLINICAL DATA:  Altered mental status EXAM: PORTABLE CHEST 1 VIEW COMPARISON:  03/14/2017 FINDINGS: EKG leads create artifact across the chest. Chronic cardiomegaly. There is no edema, consolidation, effusion, or pneumothorax. IMPRESSION: 1. No evidence of active disease. 2. Chronic cardiomegaly. Electronically Signed   By: Marnee Spring M.D.   On: 04/12/2017 13:00      ASSESSMENT / PLAN:: Urosepsis The patient has a very abnormal urine. When she came to the ED her foley appeared to be obstructed. The patient has a history of recurrent UTIs. Her WBC is elevated. Blood sugar is elevated secondary to this acute infx. The patient has an anion gap acidosis. She has been culutred and started empirically on cefipime and Aztreonam  Encephalopathy Likely related to her SIRS and elevated blood sugar.the patient has a NKHS on the besis of her UTI and worsening renal fn. Her serum osmolarity appears to be close to 320 She is getting vig\orously fluid resuscitated.The patient is  NPO until further notice.If there is hemodynamic or neurologic deterioration thepatient may require intuabtion in the next 24 hours. Keep HOB elevatred.  AKI The patient normally has rel\atvely normal renal fn. She presents with AKI. It may be on the basis iof a temporary foley obstruction. A renal US was ordered. A nephrology consult was requested as well.We are checking her CPK (? rhabo)  NKHS The p[atient has a serious fluid deficit and high serum osmolarity. She got fluid resuscoitated and is on an insulin drip  Multiple sclerosis          Norva Pavlov MD Pulmonary and Critical Care Medicine [facility] Pager: [phone]  04/12/2017, 4:20 PM

## 2017-04-12 NOTE — Progress Notes (Signed)
EEG completed; results pending.    

## 2017-04-12 NOTE — ED Notes (Signed)
Patient transported to US 

## 2017-04-12 NOTE — ED Notes (Signed)
ED Provider at bedside. 

## 2017-04-13 ENCOUNTER — Inpatient Hospital Stay (HOSPITAL_COMMUNITY): Payer: Medicaid Other

## 2017-04-13 DIAGNOSIS — N390 Urinary tract infection, site not specified: Secondary | ICD-10-CM

## 2017-04-13 DIAGNOSIS — J189 Pneumonia, unspecified organism: Secondary | ICD-10-CM

## 2017-04-13 DIAGNOSIS — Z978 Presence of other specified devices: Secondary | ICD-10-CM

## 2017-04-13 DIAGNOSIS — R402433 Glasgow coma scale score 3-8, at hospital admission: Secondary | ICD-10-CM

## 2017-04-13 DIAGNOSIS — A419 Sepsis, unspecified organism: Secondary | ICD-10-CM

## 2017-04-13 DIAGNOSIS — Z9189 Other specified personal risk factors, not elsewhere classified: Secondary | ICD-10-CM

## 2017-04-13 DIAGNOSIS — R6521 Severe sepsis with septic shock: Secondary | ICD-10-CM

## 2017-04-13 DIAGNOSIS — J9601 Acute respiratory failure with hypoxia: Secondary | ICD-10-CM

## 2017-04-13 LAB — CBC WITH DIFFERENTIAL/PLATELET
BASOS ABS: 0 10*3/uL (ref 0.0–0.1)
Basophils Relative: 0 %
EOS PCT: 0 %
Eosinophils Absolute: 0 10*3/uL (ref 0.0–0.7)
HEMATOCRIT: 31.6 % — AB (ref 36.0–46.0)
HEMOGLOBIN: 10.1 g/dL — AB (ref 12.0–15.0)
LYMPHS PCT: 8 %
Lymphs Abs: 0.8 10*3/uL (ref 0.7–4.0)
MCH: 29.3 pg (ref 26.0–34.0)
MCHC: 32 g/dL (ref 30.0–36.0)
MCV: 91.6 fL (ref 78.0–100.0)
MONOS PCT: 5 %
Monocytes Absolute: 0.5 10*3/uL (ref 0.1–1.0)
NEUTROS PCT: 87 %
Neutro Abs: 9.1 10*3/uL — ABNORMAL HIGH (ref 1.7–7.7)
Platelets: 188 10*3/uL (ref 150–400)
RBC: 3.45 MIL/uL — ABNORMAL LOW (ref 3.87–5.11)
RDW: 15.6 % — ABNORMAL HIGH (ref 11.5–15.5)
WBC: 10.4 10*3/uL (ref 4.0–10.5)

## 2017-04-13 LAB — BLOOD CULTURE ID PANEL (REFLEXED)
ACINETOBACTER BAUMANNII: NOT DETECTED
CANDIDA ALBICANS: NOT DETECTED
CANDIDA GLABRATA: NOT DETECTED
CANDIDA PARAPSILOSIS: NOT DETECTED
CANDIDA TROPICALIS: NOT DETECTED
Candida krusei: NOT DETECTED
Carbapenem resistance: NOT DETECTED
ENTEROBACTER CLOACAE COMPLEX: NOT DETECTED
ENTEROBACTERIACEAE SPECIES: DETECTED — AB
ESCHERICHIA COLI: NOT DETECTED
Enterococcus species: NOT DETECTED
HAEMOPHILUS INFLUENZAE: NOT DETECTED
KLEBSIELLA PNEUMONIAE: NOT DETECTED
Klebsiella oxytoca: NOT DETECTED
Listeria monocytogenes: NOT DETECTED
Neisseria meningitidis: NOT DETECTED
PROTEUS SPECIES: DETECTED — AB
PSEUDOMONAS AERUGINOSA: NOT DETECTED
STREPTOCOCCUS AGALACTIAE: NOT DETECTED
STREPTOCOCCUS PYOGENES: NOT DETECTED
STREPTOCOCCUS SPECIES: NOT DETECTED
Serratia marcescens: NOT DETECTED
Staphylococcus aureus (BCID): NOT DETECTED
Staphylococcus species: NOT DETECTED
Streptococcus pneumoniae: NOT DETECTED

## 2017-04-13 LAB — POCT I-STAT 3, ART BLOOD GAS (G3+)
Acid-base deficit: 9 mmol/L — ABNORMAL HIGH (ref 0.0–2.0)
Acid-base deficit: 6 mmol/L — ABNORMAL HIGH (ref 0.0–2.0)
BICARBONATE: 15.8 mmol/L — AB (ref 20.0–28.0)
BICARBONATE: 21 mmol/L (ref 20.0–28.0)
O2 SAT: 100 %
O2 Saturation: 96 %
PCO2 ART: 27.9 mmHg — AB (ref 32.0–48.0)
PCO2 ART: 49.5 mmHg — AB (ref 32.0–48.0)
PH ART: 7.357 (ref 7.350–7.450)
PH ART: 7.239 — AB (ref 7.350–7.450)
PO2 ART: 82 mmHg — AB (ref 83.0–108.0)
PO2 ART: 496 mmHg — AB (ref 83.0–108.0)
Patient temperature: 97.7
Patient temperature: 99.5
TCO2: 17 mmol/L — AB (ref 22–32)
TCO2: 22 mmol/L (ref 22–32)

## 2017-04-13 LAB — BASIC METABOLIC PANEL
ANION GAP: 13 (ref 5–15)
Anion gap: 13 (ref 5–15)
BUN: 73 mg/dL — ABNORMAL HIGH (ref 6–20)
BUN: 79 mg/dL — ABNORMAL HIGH (ref 6–20)
CALCIUM: 9.2 mg/dL (ref 8.9–10.3)
CHLORIDE: 110 mmol/L (ref 101–111)
CO2: 18 mmol/L — AB (ref 22–32)
CO2: 16 mmol/L — ABNORMAL LOW (ref 22–32)
CREATININE: 4.95 mg/dL — AB (ref 0.44–1.00)
Calcium: 8.8 mg/dL — ABNORMAL LOW (ref 8.9–10.3)
Chloride: 114 mmol/L — ABNORMAL HIGH (ref 101–111)
Creatinine, Ser: 5.13 mg/dL — ABNORMAL HIGH (ref 0.44–1.00)
GFR calc Af Amer: 10 mL/min — ABNORMAL LOW (ref >60)
GFR calc non Af Amer: 9 mL/min — ABNORMAL LOW (ref >60)
GFR calc non Af Amer: 9 mL/min — ABNORMAL LOW (ref >60)
GFR, EST AFRICAN AMERICAN: 11 mL/min — AB (ref >60)
GLUCOSE: 163 mg/dL — AB (ref 65–99)
Glucose, Bld: 171 mg/dL — ABNORMAL HIGH (ref 65–99)
POTASSIUM: 3.8 mmol/L (ref 3.5–5.1)
POTASSIUM: 3.9 mmol/L (ref 3.5–5.1)
SODIUM: 143 mmol/L (ref 135–145)
Sodium: 141 mmol/L (ref 135–145)

## 2017-04-13 LAB — CK: Total CK: 2304 U/L — ABNORMAL HIGH (ref 38–234)

## 2017-04-13 LAB — GASTROINTESTINAL PANEL BY PCR, STOOL (REPLACES STOOL CULTURE)

## 2017-04-13 LAB — GLUCOSE, CAPILLARY
GLUCOSE-CAPILLARY: 128 mg/dL — AB (ref 65–99)
GLUCOSE-CAPILLARY: 133 mg/dL — AB (ref 65–99)
GLUCOSE-CAPILLARY: 153 mg/dL — AB (ref 65–99)
GLUCOSE-CAPILLARY: 158 mg/dL — AB (ref 65–99)
GLUCOSE-CAPILLARY: 158 mg/dL — AB (ref 65–99)
GLUCOSE-CAPILLARY: 193 mg/dL — AB (ref 65–99)
GLUCOSE-CAPILLARY: 217 mg/dL — AB (ref 65–99)
Glucose-Capillary: 113 mg/dL — ABNORMAL HIGH (ref 65–99)
Glucose-Capillary: 119 mg/dL — ABNORMAL HIGH (ref 65–99)
Glucose-Capillary: 147 mg/dL — ABNORMAL HIGH (ref 65–99)
Glucose-Capillary: 152 mg/dL — ABNORMAL HIGH (ref 65–99)
Glucose-Capillary: 160 mg/dL — ABNORMAL HIGH (ref 65–99)
Glucose-Capillary: 181 mg/dL — ABNORMAL HIGH (ref 65–99)
Glucose-Capillary: 187 mg/dL — ABNORMAL HIGH (ref 65–99)
Glucose-Capillary: 228 mg/dL — ABNORMAL HIGH (ref 65–99)

## 2017-04-13 LAB — MAGNESIUM: Magnesium: 2.5 mg/dL — ABNORMAL HIGH (ref 1.7–2.4)

## 2017-04-13 LAB — PHOSPHORUS: PHOSPHORUS: 5 mg/dL — AB (ref 2.5–4.6)

## 2017-04-13 LAB — LACTIC ACID, PLASMA: Lactic Acid, Venous: 2.8 mmol/L (ref 0.5–1.9)

## 2017-04-13 LAB — MRSA PCR SCREENING: MRSA by PCR: NEGATIVE

## 2017-04-13 MED ORDER — INSULIN DETEMIR 100 UNIT/ML ~~LOC~~ SOLN: 5.0000 [IU] | Freq: Two times a day (BID) | SUBCUTANEOUS | Status: DC

## 2017-04-13 MED ORDER — STERILE WATER FOR INJECTION IV SOLN
INTRAVENOUS | Status: DC
  Administered 2017-04-13 – 2017-04-15 (×3): via INTRAVENOUS
  Filled 2017-04-13 (×5): qty 850

## 2017-04-13 MED ORDER — INSULIN GLARGINE 100 UNIT/ML ~~LOC~~ SOLN
35.0000 [IU] | Freq: Every day | SUBCUTANEOUS | Status: DC
  Administered 2017-04-13 – 2017-04-20 (×8): 35 [IU] via SUBCUTANEOUS
  Filled 2017-04-13 (×9): qty 0.35

## 2017-04-13 MED ORDER — FENTANYL CITRATE (PF) 100 MCG/2ML IJ SOLN
100.0000 ug | INTRAMUSCULAR | Status: DC | PRN
  Administered 2017-04-13: 100 ug via INTRAVENOUS
  Filled 2017-04-13 (×3): qty 2

## 2017-04-13 MED ORDER — MIDAZOLAM HCL 2 MG/2ML IJ SOLN
4.0000 mg | Freq: Once | INTRAMUSCULAR | Status: AC
  Administered 2017-04-13: 4 mg via INTRAVENOUS

## 2017-04-13 MED ORDER — FENTANYL CITRATE (PF) 100 MCG/2ML IJ SOLN
100.0000 ug | INTRAMUSCULAR | Status: DC | PRN
  Administered 2017-04-14 – 2017-04-17 (×5): 100 ug via INTRAVENOUS
  Filled 2017-04-13 (×5): qty 2

## 2017-04-13 MED ORDER — MIDAZOLAM HCL 2 MG/2ML IJ SOLN
2.0000 mg | INTRAMUSCULAR | Status: DC | PRN
  Administered 2017-04-14: 2 mg via INTRAVENOUS
  Filled 2017-04-13: qty 2

## 2017-04-13 MED ORDER — LIDOCAINE HCL (PF) 1 % IJ SOLN
INTRAMUSCULAR | Status: AC
  Administered 2017-04-13: 5 mL
  Filled 2017-04-13: qty 5

## 2017-04-13 MED ORDER — NOREPINEPHRINE BITARTRATE 1 MG/ML IV SOLN
0.0000 ug/min | INTRAVENOUS | Status: DC
  Administered 2017-04-13: 4 ug/min via INTRAVENOUS
  Filled 2017-04-13: qty 4

## 2017-04-13 MED ORDER — HYDRALAZINE HCL 20 MG/ML IJ SOLN
INTRAMUSCULAR | Status: AC
  Administered 2017-04-13: 10 mg via INTRAVENOUS
  Filled 2017-04-13: qty 1

## 2017-04-13 MED ORDER — FAMOTIDINE 20 MG IN NS 100 ML IVPB
20.0000 mg | INTRAVENOUS | Status: DC
  Administered 2017-04-13 – 2017-04-15 (×3): 20 mg via INTRAVENOUS
  Filled 2017-04-13 (×3): qty 100

## 2017-04-13 MED ORDER — CHLORHEXIDINE GLUCONATE 0.12% ORAL RINSE (MEDLINE KIT)
15.0000 mL | Freq: Two times a day (BID) | OROMUCOSAL | Status: DC
  Administered 2017-04-13 – 2017-04-17 (×8): 15 mL via OROMUCOSAL

## 2017-04-13 MED ORDER — FENTANYL CITRATE (PF) 100 MCG/2ML IJ SOLN: 100.0000 ug | Freq: Once | INTRAMUSCULAR | Status: DC

## 2017-04-13 MED ORDER — SODIUM CHLORIDE 0.9 % IV SOLN
2.0000 g | INTRAVENOUS | Status: DC
  Administered 2017-04-13 – 2017-04-18 (×6): 2 g via INTRAVENOUS
  Filled 2017-04-13 (×7): qty 20

## 2017-04-13 MED ORDER — HYDRALAZINE HCL 20 MG/ML IJ SOLN
10.0000 mg | Freq: Four times a day (QID) | INTRAMUSCULAR | Status: DC | PRN
  Administered 2017-04-13: 10 mg via INTRAVENOUS

## 2017-04-13 MED ORDER — ACETAMINOPHEN 160 MG/5ML PO SOLN
650.0000 mg | Freq: Four times a day (QID) | ORAL | Status: DC | PRN
  Administered 2017-04-13 – 2017-04-19 (×3): 650 mg
  Filled 2017-04-13 (×3): qty 20.3

## 2017-04-13 MED ORDER — MIDAZOLAM HCL 2 MG/2ML IJ SOLN
2.0000 mg | INTRAMUSCULAR | Status: DC | PRN
  Administered 2017-04-14 – 2017-04-17 (×4): 2 mg via INTRAVENOUS
  Filled 2017-04-13 (×5): qty 2

## 2017-04-13 MED ORDER — INSULIN ASPART 100 UNIT/ML ~~LOC~~ SOLN
2.0000 [IU] | SUBCUTANEOUS | Status: DC
  Administered 2017-04-13 (×2): 4 [IU] via SUBCUTANEOUS

## 2017-04-13 MED ORDER — CHLORHEXIDINE GLUCONATE 0.12% ORAL RINSE (MEDLINE KIT): 15.0000 mL | Freq: Two times a day (BID) | OROMUCOSAL | Status: DC

## 2017-04-13 MED ORDER — MIDAZOLAM HCL 2 MG/2ML IJ SOLN
INTRAMUSCULAR | Status: AC
  Filled 2017-04-13: qty 4

## 2017-04-13 MED ORDER — HEPARIN SODIUM (PORCINE) 5000 UNIT/ML IJ SOLN
5000.0000 [IU] | Freq: Three times a day (TID) | INTRAMUSCULAR | Status: DC
  Administered 2017-04-13 – 2017-04-24 (×32): 5000 [IU] via SUBCUTANEOUS
  Filled 2017-04-13 (×32): qty 1

## 2017-04-13 MED ORDER — ROCURONIUM BROMIDE 50 MG/5ML IV SOLN
1.0000 mg/kg | Freq: Once | INTRAVENOUS | Status: AC
  Administered 2017-04-13: 50 mg via INTRAVENOUS

## 2017-04-13 MED ORDER — ORAL CARE MOUTH RINSE
15.0000 mL | Freq: Four times a day (QID) | OROMUCOSAL | Status: DC
  Administered 2017-04-13: 15 mL via OROMUCOSAL

## 2017-04-13 MED ORDER — FENTANYL CITRATE (PF) 100 MCG/2ML IJ SOLN
INTRAMUSCULAR | Status: AC
  Filled 2017-04-13: qty 2

## 2017-04-13 MED ORDER — ORAL CARE MOUTH RINSE
15.0000 mL | Freq: Four times a day (QID) | OROMUCOSAL | Status: DC
  Administered 2017-04-14 – 2017-04-24 (×36): 15 mL via OROMUCOSAL

## 2017-04-13 NOTE — Consult Note (Signed)
Neurology Consultation Referring Physician: Dr. Carlota Raspberry  CC: AMS  History is obtained from: Chart review  HPI: Debra [name] is a 52 y.o. 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 Last seen normal is unknown however nursing staff say that she has been like this starting last night. On arrival to [facility] ER patient according to the history of present illness had her eyes open, however would not follow any commands and was nonverbal. Patient found to be in sepsis and had an elevated blood sugar.  Critical care was consulted as patient continued to be lethargic and in septic shock. Patient says subsequently intubated for airway protection and neurology was consulted for input.  ROS: A 14 point ROS was performed and is negative except as noted in the HPI.    Past Medical History:  Diagnosis Date  . Adenomatous colonic polyps   . Anemia    2005  . Anxiety    1990  . Arthritis   . Asthma    2000  . Cataract   . Depression with psychotic symptoms   . Difficult intubation    narrow airway  . Gastroesophageal Reflux Disease (GERD)   . Heart murmur    Birth  . Hyperlipidemia    2005  . Hypertension    1998  . Internal hemorrhoids 04/24/06   on colonoscopy  . Migraine   . Multiple sclerosis (HCC)   . Neuropathic bladder   . Neuropathy of the hands & feet   . Restless Leg Syndrome   . Right Ankle Fracture 10/06/2013  . Sleep paralysis   . Stable angina pectoris    2007: cath showing normal cors.   . Stroke 1990  . Type I Diabetes Mellitus 1988    Family History  Problem Relation Age of Onset  . Hypertension Mother   . Kidney disease Mother   . Hypertension Father   . Breast cancer Maternal Grandmother   . Prostate cancer Maternal Grandfather   . Ovarian cancer Paternal Grandmother   . Prostate  cancer Paternal Grandfather   . Colon cancer Maternal Uncle        Family history of malignant neoplasm of gastrointestinal tract     Social History:  reports that  has never smoked. she has never used smokeless tobacco. She reports that she does not drink alcohol or use drugs.   Exam: Current vital signs: BP (!) 108/59   Pulse (!) 37   Temp 99.5 F (37.5 C) (Oral)   Resp 16   Ht 5\' 5"  (1.651 m)   Wt 67.2 kg (148 lb 2.4 oz)   SpO2 100%   BMI 24.65 kg/m  Vital signs in last 24 hours: Temp:  [97.7 F (36.5 C)-100.1 F (37.8 C)] 99.5 F (37.5 C) (02/15 0344) Pulse Rate:  [37-100] 37 (02/15 0400) Resp:  [0-29] 16 (02/15 0400) BP: (97-151)/(55-124) 108/59 (02/15 0400) SpO2:  [100 %] 100 % (02/15 0500) FiO2 (%):  [30 %-100 %] 30 % (02/15 0558) Weight:  [67.2 kg (148 lb 2.4 oz)] 67.2 kg (148 lb 2.4 oz) (02/14 2024)   Physical Exam  Constitutional: Intubated, lying in bed. Appears fairly well-nourished Psych: Unable to assess Eyes: No scleral injection HENT: No OP obstrucion Head: Normocephalic.  Cardiovascular: Normal rate and regular rhythm.  Respiratory: Effort normal, non-labored breathing GI: Soft.  No distension. There is no tenderness.  Skin: WDI  Neuro: Exam limited as patient was just paralyzed and sedated for intubation  Mental Status: Patient does not respond to verbal stimuli.  Does not respond to deep sternal rub.  Does not follow commands.  No verbalizations noted.  Cranial Nerves: patient does not respond confrontation bilaterally, pupils 24mm bilaterally, sluggish Motor: Does not withdraw in any extremity.  No spontaneous movement noted.  Sensory:  Does not respond to noxious stimuli in any extremity. Deep Tendon Reflexes:  Absent throughout. Plantars: Mute Cerebellar: Unable to perform    I have reviewed labs in epic and the results pertinent to this consultation are: Elevated lactic acid  I have reviewed the images obtained: I reviewed CT head,  no obvious acute abnormality  Assessment and plan  Metabolic encephalopathy secondary to septic shock Multiple sclerosis with recent rituximab infusion Recurrent urosepsis due to neurogenic bladder and chronic indwelling Foley catheter   Plan Continue to treat sepsis, recommend broad-spectrum antibiotics As patient is possibly immunocompromised from recent rituximab infusion required for aggressive MS, would have low threshold to perform lumbar puncture if no response to antibiotics. Would also consider covering for opportunistic infections. ID consult as patient having recurrent admissions for urosepsis  Will obtain EEG in the morning to evaluate for nonconvulsive status as an alternate explaination for  altered mental status Patient clinically improves in a few days on treatment of infection, then there is no need to repeat an MRI brain  Will continue to follow    Georgiana Spinner Aroor MD [facility] [phone]  If 7pm to 7am, please call on call as listed on AMION.

## 2017-04-13 NOTE — Progress Notes (Signed)
PULMONARY / CRITICAL CARE MEDICINE   Name: Debra [name] MRN: [id number] DOB: [birth year]    ADMISSION DATE:  04/12/2017 CONSULTATION DATE:  04/13/17   CHIEF COMPLAINT:  Altered mental startus  HISTORY OF PRESENT ILLNESS:   The patient was transferred from her NH facility too the ED for altered mental status.The patient is quite obtunded presently. This appears to have started earlier today. She has fairly advanced Multiple sclerosis.The  Patient has a neurogenic bkladder and has been subject to UTIs in the past The patient is not ambulatory without help.She does eat normally as she does niot have a feeding tube The patient has her eyes open. Does not respond to my voice. Does withdraw to noxious stimuli.Has a decreased gag reflex.Will not follow commands.tHERE WAS NO WITNESSED SEIZURE ACTIVITY ANDS THE PATIENT DOES NOT HAVE THAT  History. When the patient arrived in the ED her indwelling foley bag was empty.The catheter was changed and than the patient had a large output of somewhat bloody urine. Her BP was a little "soft" on presentation but came up to the normal ran ge after 2 liters of IV fluid.her creatinine is 5 (was 1+ less than 1 month ago) Her blood sugar is >400. The patient has dry mucosae and poor skin turgor. Her AG was 20-22. Bicarb was decreased to 17.The patient has a history of type 1 DM.o2 sat is 100% on RA.her urien showed many bacteria, WBCS, large Hb, 2+ PROTEINURIA.  Subjective / Interval events:   Required intubation for airway protection early 2/15.  BAL was performed    STUDIES:  Continuous EEG 2/15 >>   CULTURES: Tracheal aspirate 2/15 >>  GI panel PCR 2/15 >>  Blood 2/15 >> GNR >>  BCID 2/15 >> proteus   ANTIBIOTICS: Aztreonam x1 2/14 Cefepime 2/14 >> 2/15 Ceftriaxone 2/15 >>   SIGNIFICANT EVENTS:  LINES/TUBES: Foley changed 2/14 >>  ETT 2/15 >>    VITAL SIGNS: BP (!) 103/57   Pulse (!) 103   Temp (!) 100.9 F (38.3 C) (Axillary) Comment:  RN Di Kindle aware  Resp 19   Ht _0  (1.651 m)   Wt 67.2 kg (148 lb 2.4 oz)   SpO2 98%   BMI 24.65 kg/m       Vent Mode: PSV;CPAP FiO2 (%):  [30 %-100 %] 30 % Set Rate:  [16 bmp-18 bmp] 18 bmp Vt Set:  [500 mL] 500 mL PEEP:  [5 cmH20] 5 cmH20 Pressure Support:  [10 cmH20] 10 cmH20 Plateau Pressure:  [18 cmH20] 18 cmH20  INTAKE / OUTPUT: I/O last 3 completed shifts: In: 2851.8 [I.V.:631.8; IV Piggyback:2220] Out: 140 [Urine:140]  PHYSICAL EXAMINATION: General: Ill-appearing, mechanically ventilated, sedated Neuro: Grimace with stimulation moves upper extremities HEENT: ET tube in place Cardiovascular: Regular, 3/6 systolic murmur, no edema Lungs: Decreased at both bases, otherwise clear Abdomen: Soft, benign, positive bowel sounds Musculoskeletal: Normal tone Skin: No apparent rash  LABS:  BMET Recent Labs  Lab 04/12/17 1235 04/12/17 2047 04/13/17 0341  NA 139 141 141  K 5.2* 3.5 3.8  CL 102 110 110  CO2 17* 16* 18*  BUN 60* 66* 73*  CREATININE 5.61* 5.34* 5.13*  GLUCOSE 400* 329* 163*    Electrolytes Recent Labs  Lab 04/12/17 1235 04/12/17 2047 04/13/17 0341  CALCIUM 10.1 9.4 9.2  MG  --   --  2.5*  PHOS  --   --  5.0*    CBC Recent Labs  Lab 04/12/17 1235 04/13/17 0341  WBC 13.8*  10.4  HGB 12.2 10.1*  HCT 37.5 31.6*  PLT 230 188    Coag's Recent Labs  Lab 04/12/17 1519  INR 1.53    Sepsis Markers Recent Labs  Lab 04/12/17 1324 04/12/17 1534 04/12/17 2047 04/13/17 0341  LATICACIDVEN 3.56* 2.83*  --  2.8*  PROCALCITON  --   --  >150.00  --     ABG Recent Labs  Lab 04/13/17 0341 04/13/17 0555  PHART 7.357 7.239*  PCO2ART 27.9* 49.5*  PO2ART 82.0* 496.0*    Liver Enzymes Recent Labs  Lab 04/12/17 1235  AST 93*  ALT 86*  ALKPHOS 89  BILITOT 1.3*  ALBUMIN 3.0*    Cardiac Enzymes No results for input(s): TROPONINI, PROBNP in the last 168 hours.  Glucose Recent Labs  Lab 04/13/17 0342 04/13/17 0438  04/13/17 0545 04/13/17 0635 04/13/17 0744 04/13/17 0902  GLUCAP 160* 128* 113* 119* 133* 147*    Imaging Ct Head Wo Contrast  Result Date: 04/12/2017 CLINICAL DATA:  Altered mental status. History of multiple sclerosis. EXAM: CT HEAD WITHOUT CONTRAST TECHNIQUE: Contiguous axial images were obtained from the base of the skull through the vertex without intravenous contrast. COMPARISON:  MRI brain dated March 14, 2017. CT head dated March 14, 2017. FINDINGS: Brain: No evidence of acute infarction, hemorrhage, hydrocephalus, extra-axial collection or mass lesion/mass effect. Scattered areas of hypodensity in the periventricular deep white matter are not significantly changed when compared to prior study, and are consistent with patient's history of multiple sclerosis. Vascular: Atherosclerotic vascular calcification of the carotid siphons. No hyperdense vessel. Skull: Normal. Negative for fracture or focal lesion. Sinuses/Orbits: Extensive paranasal sinus mucosal thickening. Small air-fluid level in the left sphenoid sinus. Findings are similar to prior study. No acute orbital finding. Other: None. IMPRESSION: 1.  No acute intracranial abnormality. 2. Stable findings related to multiple sclerosis. 3. Chronic paranasal sinus disease, not significantly changed. Electronically Signed   By: Titus Dubin M.D.   On: 04/12/2017 13:07   US Renal  Result Date: 04/12/2017 CLINICAL DATA:  Bladder outlet obstruction. EXAM: RENAL / URINARY TRACT ULTRASOUND COMPLETE COMPARISON:  CT scan 03/14/2017 FINDINGS: Right Kidney: Length: 12.5 cm. Mild fullness right intrarenal collecting system. Mild right hydroureter noted proximally. Echogenicity within normal limits. Left Kidney: Length: 11.2 cm. Echogenicity within normal limits. No mass or hydronephrosis visualized. Bladder: Foley catheter decompresses the urinary bladder. IMPRESSION: Mild right hydronephrosis with mild proximal right hydroureter. Electronically  Signed   By: Misty Stanley M.D.   On: 04/12/2017 19:01   Dg Chest Port 1 View  Result Date: 04/13/2017 CLINICAL DATA:  Intubation. EXAM: PORTABLE CHEST 1 VIEW COMPARISON:  Radiograph yesterday. FINDINGS: Endotracheal tube 2.9 cm from the carina. Enteric tube in place, tip below the diaphragm not included in the field of view. Mild cardiomegaly is unchanged. Low lung volumes. No consolidation, pulmonary edema or pleural fluid. No pneumothorax. IMPRESSION: 1. Endotracheal tube 2.9 cm from the carina. Enteric tube in place, tip below the diaphragm not included in the field of view. 2. Low lung volumes. Electronically Signed   By: Jeb Levering M.D.   On: 04/13/2017 05:14   Dg Chest Port 1 View  Result Date: 04/12/2017 CLINICAL DATA:  Altered mental status EXAM: PORTABLE CHEST 1 VIEW COMPARISON:  03/14/2017 FINDINGS: EKG leads create artifact across the chest. Chronic cardiomegaly. There is no edema, consolidation, effusion, or pneumothorax. IMPRESSION: 1. No evidence of active disease. 2. Chronic cardiomegaly. Electronically Signed   By: Neva Seat.D.  On: 04/12/2017 13:00    DISCUSSION: 52 year old woman history of severe multiple sclerosis and associated debilitation, neurogenic bladder with a chronic Foley catheter.  Admitted with septic shock and gram-negative rod bacteremia, progressed to acute renal failure requiring mechanical ventilation  ASSESSMENT / PLAN:  PULMONARY A: Acute respiratory failure, mechanical ventilation due to poor airway protection, neurological dysfunction P:   Continue current ventilator support. VAP prevention orders Assess for spontaneous breathing when mental status and neuromuscular status improve  CARDIOVASCULAR A:  Septic shock P:  Volume resuscitation Norepinephrine, wean as able for MAP greater than 65  Antibiotics as below  RENAL A:   Acute renal failure, suspect obstructive uropathy given nonfunctioning chronic Foley catheter, now  replaced Neurogenic bladder P:   Foley catheter replaced Follow BMP, urine output, anticipate a postobstructive diuresis, improvement Nephrology consulted, appreciate assistance  GASTROINTESTINAL A:   SUP Transaminitis, consider shock liver, etiology unclear. P:   Pepcid as ordered Consider further imaging if LFTs continue to rise  HEMATOLOGIC A:   Anemia of chronic disease and critical illness P:  Follow CBC, coag Transfusion threshold Hgb > 7  INFECTIOUS A:   Septic shock Gram-negative rod bacteremia, probably Proteus based on BCID Complicated UTI Respiratory culture with moderate GPC, moderate GNR P:   Okay to change to ceftriaxone on 2/15 given probable Proteus bacteremia, continue to follow sensitivities Follow respiratory culture, chest x-ray, respiratory status.  Consider broadening antibiotics depending on culture data and clinical status  ENDOCRINE A:   Diabetes mellitus P:   Insulin drip weaning to off Phase 3 sliding scale insulin ICU protocol Lantus started 2/15  NEUROLOGIC A:   Severe multiple sclerosis, chronically debilitated.  Acute flare in the setting of infection Toxic metabolic encephalopathy due to sepsis, renal failure P:   RASS goal: -1 to 0 Fentanyl and Versed as needed We will need to follow NIF, FVC to assess readiness for spontaneous breathing and extubation   FAMILY  - Updates: none at bedside 2/15  - Inter-disciplinary family meet or Palliative Care meeting due by: 04/19/17   Independent CC time 35 minutes.   Baltazar Apo, MD, PhD 04/13/2017, 10:24 AM [facility] [phone] or if no answer [phone]

## 2017-04-13 NOTE — Procedures (Signed)
Bronchoscopy Procedure Note Debra [name] [id number] 09/11/1965  Procedure: Bronchoscopy Indications: Diagnostic evaluation of the airways, Obtain specimens for culture and/or other diagnostic studies and Remove secretions  Procedure Details Consent: Unable to obtain consent because of altered level of consciousness. Time Out: Verified patient identification, verified procedure, site/side was marked, verified correct patient position, special equipment/implants available, medications/allergies/relevent history reviewed, required imaging and test results available.  Performed  In preparation for procedure, patient was given 100% FiO2, bronchoscope lubricated and lidocaine given via ETT (5 ml). Sedation: Benzodiazepines and Muscle relaxants- done immediately after intubation no additional meds were given  Airway entered and the following bronchi were examined:RUL, RML,RLL.  And left bronchial tree. Airways pink, non friable mucosa no edema, thick secretions seen in the RML and RLL with repooling. Procedures performed: BAL 20cc into RLL - good return of aliquot Bronchoscope removed. Patient placed back on 100% FiO2 at conclusion of procedure.    Evaluation Hemodynamic Status: Transient hypotension treated with pressors; O2 sats: stable throughout Patient's Current Condition: stable Specimens:  Sent purulent fluid Complications: No apparent complications Patient did tolerate procedure well.   [name] Debra [name] 04/13/2017

## 2017-04-13 NOTE — Progress Notes (Signed)
PHARMACY - PHYSICIAN COMMUNICATION CRITICAL VALUE ALERT - BLOOD CULTURE IDENTIFICATION (BCID)  Debra [name] is an 52 y.o. female who presented to [facility] on 04/12/2017 with a chief complaint of AMS/UTI  Assessment:  1/2 blood cultures growing Proteus  Name of physician (or Provider) Contacted: NA  Current antibiotics: Cefepime   Changes to prescribed antibiotics recommended:   Consider narrowing to Rocephin 2 g IV q24h when appropriate  Results for orders placed or performed during the hospital encounter of 04/12/17  Blood Culture ID Panel (Reflexed) (Collected: 04/12/2017 12:50 PM)  Result Value Ref Range   Enterococcus species NOT DETECTED NOT DETECTED   Listeria monocytogenes NOT DETECTED NOT DETECTED   Staphylococcus species NOT DETECTED NOT DETECTED   Staphylococcus aureus NOT DETECTED NOT DETECTED   Streptococcus species NOT DETECTED NOT DETECTED   Streptococcus agalactiae NOT DETECTED NOT DETECTED   Streptococcus pneumoniae NOT DETECTED NOT DETECTED   Streptococcus pyogenes NOT DETECTED NOT DETECTED   Acinetobacter baumannii NOT DETECTED NOT DETECTED   Enterobacteriaceae species DETECTED (A) NOT DETECTED   Enterobacter cloacae complex NOT DETECTED NOT DETECTED   Escherichia coli NOT DETECTED NOT DETECTED   Klebsiella oxytoca NOT DETECTED NOT DETECTED   Klebsiella pneumoniae NOT DETECTED NOT DETECTED   Proteus species DETECTED (A) NOT DETECTED   Serratia marcescens NOT DETECTED NOT DETECTED   Carbapenem resistance NOT DETECTED NOT DETECTED   Haemophilus influenzae NOT DETECTED NOT DETECTED   Neisseria meningitidis NOT DETECTED NOT DETECTED   Pseudomonas aeruginosa NOT DETECTED NOT DETECTED   Candida albicans NOT DETECTED NOT DETECTED   Candida glabrata NOT DETECTED NOT DETECTED   Candida krusei NOT DETECTED NOT DETECTED   Candida parapsilosis NOT DETECTED NOT DETECTED   Candida tropicalis NOT DETECTED NOT DETECTED    Eddie Candle 04/13/2017  5:23  AM

## 2017-04-13 NOTE — Progress Notes (Signed)
Initial Nutrition Assessment  DOCUMENTATION CODES:   Not applicable  INTERVENTION:   If unable to extubate within the next 24 hours, recommend start TF:  Vital AF 1.2 at 55 ml/h (1320 ml per day)  Provides 1584 kcal, 99 gm protein, 1071 ml free water daily  NUTRITION DIAGNOSIS:   Inadequate oral intake related to inability to eat as evidenced by NPO status.  GOAL:   Patient will meet greater than or equal to 90% of their needs  MONITOR:   Vent status, Labs, I & O's, Skin  REASON FOR ASSESSMENT:   Ventilator    ASSESSMENT:   52 yo female with PMH of multiple sclerosis, neurogenic bladder, HLD, stroke, neuropathy, DM-1, HTN, and GERD, who was admitted on 2/14 from her nursing facility with AMS. Required intubation early morning of 2/15.  Discussed patient in ICU rounds and with RN today. May be able to begin TF tomorrow. Will likely remain intubated for at least a few more days.  Being treated for proteus bacteremia. Patient is currently intubated on ventilator support MV: 13.1 L/min Temp (24hrs), Avg:99 F (37.2 C), Min:97.3 F (36.3 C), Max:100.9 F (38.3 C)  Labs reviewed. Phosphorus 5 (H), magnesium 2.5 (H) CBG's: [phone] Medications reviewed.   NUTRITION - FOCUSED PHYSICAL EXAM:  Unable to complete NFPE, however, suspect muscle depletion is present given hx of advanced multiple sclerosis.  Diet Order:  Diet NPO time specified  EDUCATION NEEDS:   No education needs have been identified at this time  Skin:  Skin Assessment: Reviewed RN Assessment  Last BM:  2/14  Height:   Ht Readings from Last 1 Encounters:  04/12/17 5\' 5"  (1.651 m)    Weight:   Wt Readings from Last 1 Encounters:  04/12/17 148 lb 2.4 oz (67.2 kg)    Ideal Body Weight:  56.8 kg  BMI:  Body mass index is 24.65 kg/m.  Estimated Nutritional Needs:   Kcal:  1650  Protein:  90-100 gm  Fluid:  1.7 L   Joaquin Courts, RD, LDN, CNSC Pager [phone] After  Hours Pager [phone]

## 2017-04-13 NOTE — Consult Note (Signed)
Reason for Consult: AKI/CKD stage 3 Referring Physician:  Delton Coombes, MD  Debra [name] is an 52 y.o. female.  HPI: Pt is a 52yo AAF with PMH significant for advanced multiple sclerosis (permanent resident at [facility]), neurogenic bladder, HTN, type 1 DM, CKD stage 3, recurrent UTI's, and h/o CVA who was transferred from her SNF 04/12/17 due to altered mental status.  She was found to have GNR urosepsis requiring intubation and pressors.  She was also noted to have a clogged Foley catheter which has been replaced.  We were consulted to help further evaluate and manage her AKI/CKD.  The trend in Scr is seen below.  Of note, she had been taking lisinopril prior to admission (which has been on hold since admission).    Trend in Creatinine:  Creatinine, Ser  Date/Time Value Ref Range Status  04/13/2017 03:41 AM 5.13 (H) 0.44 - 1.00 mg/dL Final  12/26/1312 38:88 PM 5.34 (H) 0.44 - 1.00 mg/dL Final  75/79/7282 06:01 PM 5.61 (H) 0.44 - 1.00 mg/dL Final  56/15/3794 32:76 AM 1.16 (H) 0.44 - 1.00 mg/dL Final  14/70/9295 74:73 AM 1.76 (H) 0.44 - 1.00 mg/dL Final  40/37/0964 38:38 PM 2.80 (H) 0.44 - 1.00 mg/dL Final  18/40/3754 36:06 AM 2.80 (H) 0.44 - 1.00 mg/dL Final  77/04/4033 24:81 AM 1.21 (H) 0.44 - 1.00 mg/dL Final  85/90/9311 21:62 AM 1.39 (H) 0.44 - 1.00 mg/dL Final  44/69/5072 25:75 AM 1.38 (H) 0.44 - 1.00 mg/dL Final  07/14/3356 25:18 AM 1.35 (H) 0.44 - 1.00 mg/dL Final  98/42/1031 28:11 AM 1.38 (H) 0.44 - 1.00 mg/dL Final  88/67/7373 66:81 AM 1.59 (H) 0.44 - 1.00 mg/dL Final  59/47/0761 51:83 AM 1.92 (H) 0.44 - 1.00 mg/dL Final  43/73/5789 78:47 AM 1.47 (H) 0.44 - 1.00 mg/dL Final  84/01/8207 13:88 AM 1.50 (H) 0.44 - 1.00 mg/dL Final  71/95/9747 18:55 AM 1.59 (H) 0.44 - 1.00 mg/dL Final  01/58/6825 74:93 AM 1.50 (H) 0.44 - 1.00 mg/dL Final  55/21/7471 59:53 AM 1.60 (H) 0.44 - 1.00 mg/dL Final  96/72/8979 15:04 AM 1.70 (H) 0.44 - 1.00 mg/dL Final  13/64/3837 79:39 AM 1.80 (H) 0.44 - 1.00  mg/dL Final  68/86/4847 20:72 AM 1.72 (H) 0.44 - 1.00 mg/dL Final  18/28/8337 44:51 PM 1.70 (H) 0.44 - 1.00 mg/dL Final  46/05/7996 72:15 AM 1.83 (H) 0.44 - 1.00 mg/dL Final  87/27/6184 85:92 AM 2.29 (H) 0.44 - 1.00 mg/dL Final  76/39/4320 03:79 AM 1.52 (H) 0.44 - 1.00 mg/dL Final  44/46/1901 22:24 PM 1.14 (H) 0.44 - 1.00 mg/dL Final  11/46/4314 27:67 AM 1.44 (H) 0.44 - 1.00 mg/dL Final  03/09/347 61:16 AM 1.36 (H) 0.44 - 1.00 mg/dL Final  43/53/9122 58:34 AM 1.54 (H) 0.44 - 1.00 mg/dL Final  62/19/4712 52:71 AM 1.30 (H) 0.44 - 1.00 mg/dL Final  29/29/0903 01:49 PM 1.52 (H) 0.44 - 1.00 mg/dL Final  96/92/4932 41:99 AM 1.44 (H) 0.44 - 1.00 mg/dL Final  14/44/5848 35:07 PM 1.28 (H) 0.44 - 1.00 mg/dL Final  57/32/2567 20:91 AM 1.27 (H) 0.44 - 1.00 mg/dL Final  98/03/2177 81:02 AM 1.12 (H) 0.44 - 1.00 mg/dL Final  54/86/2824 17:53 AM 0.97 0.44 - 1.00 mg/dL Final  03/02/457 13:68 AM 0.99 0.44 - 1.00 mg/dL Final  59/92/3414 43:60 AM 1.21 (H) 0.44 - 1.00 mg/dL Final  16/58/0063 49:49 AM 1.56 (H) 0.44 - 1.00 mg/dL Final  44/73/9584 41:71 PM 2.12 (H) 0.44 - 1.00 mg/dL Final  27/87/1836 72:55 AM 2.46 (H)  0.44 - 1.00 mg/dL Final  16/11/9602 54:09 AM 2.51 (H) 0.44 - 1.00 mg/dL Final  81/19/1478 29:56 AM 2.59 (H) 0.44 - 1.00 mg/dL Final  21/30/8657 84:69 PM 3.03 (H) 0.44 - 1.00 mg/dL Final  62/95/2841 32:44 PM 2.95 (H) 0.44 - 1.00 mg/dL Final  03/01/7251 66:44 AM 2.72 (H) 0.44 - 1.00 mg/dL Final  03/47/4259 56:38 AM 1.15 (H) 0.44 - 1.00 mg/dL Final  75/64/3329 51:88 AM 1.70 (H) 0.44 - 1.00 mg/dL Final  41/66/0630 16:01 PM 2.01 (H) 0.44 - 1.00 mg/dL Final  09/32/3557 32:20 AM 2.00 (H) 0.44 - 1.00 mg/dL Final  25/42/7062 37:62 AM 2.36 (H) 0.44 - 1.00 mg/dL Final   83/15/1761 60:73 PM 0.79 0.50 - 1.10 mg/dL Final  71/07/2692 85:46 AM 0.72 0.50 - 1.10 mg/dL Final  27/04/5007 38:18 AM 0.78 0.50 - 1.10 mg/dL Final  29/93/7169 67:89 PM 0.75 0.50 - 1.10 mg/dL Final  38/11/1749 02:58 AM 0.69 0.50 -  1.10 mg/dL Final  52/77/8242 35:36 PM 0.73 0.50 - 1.10 mg/dL Final  14/43/1540 08:67 PM 0.88 0.50 - 1.10 mg/dL Final  61/95/0932 67:12 AM 0.75 0.50 - 1.10 mg/dL Final  45/80/9983 38:25 AM 0.76 0.40 - 1.20 mg/dL Final  05/39/7673 41:93 AM 0.59 0.40 - 1.20 mg/dL Final    PMH:   Past Medical History:  Diagnosis Date  . Adenomatous colonic polyps   . Anemia    2005  . Anxiety    1990  . Arthritis   . Asthma    2000  . Cataract   . Depression with psychotic symptoms   . Difficult intubation    narrow airway  . Gastroesophageal Reflux Disease (GERD)   . Heart murmur    Birth  . Hyperlipidemia    2005  . Hypertension    1998  . Internal hemorrhoids 04/24/06   on colonoscopy  . Migraine   . Multiple sclerosis (HCC)   . Neuropathic bladder   . Neuropathy of the hands & feet   . Restless Leg Syndrome   . Right Ankle Fracture 10/06/2013  . Sleep paralysis   . Stable angina pectoris    2007: cath showing normal cors.   . Stroke 1990  . Type I Diabetes Mellitus 1988    PSH:   Past Surgical History:  Procedure Laterality Date  . bilateral foot surgery    . BREAST SURGERY Left    biopsy left breast  . CARDIAC CATHETERIZATION  2007   around 2007 or 2008  . CESAREAN SECTION    . CHOLECYSTECTOMY    . COLONOSCOPY W/ BIOPSIES AND POLYPECTOMY    . DILATION AND CURETTAGE OF UTERUS  12/29/2010   Surgeon: Tereso Newcomer, MD;  Location: [facility];  Service: Gynecology  . ENDOMETRIAL ABLATION W/ NOVASURE N/A 12/2009  . EYE SURGERY    . HYSTEROSCOPY  12/29/2010   Procedure: HYSTEROSCOPY WITH HYDROTHERMAL ABLATION;  Surgeon: Tereso Newcomer, MD;  Location: [facility];  Service: Gynecology;;  . IUD REMOVAL  12/29/2010   Procedure: INTRAUTERINE DEVICE (IUD) REMOVAL;  Surgeon: Tereso Newcomer, MD;  Location: [facility];  Service: Gynecology;  Laterality: N/A;  . LAPAROSCOPIC TUBAL LIGATION  12/29/2010   Procedure: LAPAROSCOPIC TUBAL LIGATION;  Surgeon: Tereso Newcomer, MD;  Location: [facility];   Service: Gynecology;  Laterality: Bilateral;  . ORIF ANKLE FRACTURE Right 10/09/2013   Procedure: Open reduction internal fixation right ankle;  Surgeon: Mable Paris, MD;  Location: [facility];  Service: Orthopedics;  Laterality: Right;  Open reduction internal fixation right ankle  . TRIGGER FINGER RELEASE     x 3    Allergies:  Allergies  Allergen Reactions  . Penicillins Anaphylaxis, Nausea And Vomiting and Rash    Has patient had a PCN reaction causing immediate rash, facial/tongue/throat swelling, SOB or lightheadedness with hypotension: Yes Has patient had a PCN reaction causing severe rash involving mucus membranes or skin necrosis: Yes Has patient had a PCN reaction that required hospitalization No Has patient had a PCN reaction occurring within the last 10 years: Yes If all of the above answers are "NO", then may proceed with Cephalosporin use.   . Pollen Extract Other (See Comments)    Seasonal Allergies  . Tape Rash    Medications:   Prior to Admission medications   Medication Sig Start Date End Date Taking? Authorizing Provider  acetaminophen (TYLENOL) 325 MG tablet Take 650 mg by mouth every 6 (six) hours as needed for mild pain or fever.    [provider]  amLODipine (NORVASC) 10 MG tablet Take 1 tablet (10 mg total) by mouth daily. 02/14/17 02/14/18  Angelita Ingles, MD  aspirin EC 81 MG tablet Take 1 tablet (81 mg total) by mouth daily. 02/14/17   Angelita Ingles, MD  atorvastatin (LIPITOR) 40 MG tablet Take 1 tablet (40 mg total) by mouth daily. 02/14/17   Angelita Ingles, MD  baclofen 5 MG TABS Take 5 mg by mouth 3 (three) times daily. 02/14/17   Angelita Ingles, MD  bethanechol (URECHOLINE) 50 MG tablet Take 1 tablet (50 mg total) by mouth 3 (three) times daily. 02/14/17   Angelita Ingles, MD  Blood Glucose Monitoring Suppl (AGAMATRIX PRESTO PRO METER) DEVI The patient is insulin requiring, ICD 10 code E10.9. The patient tests 4 times  per day. 12/15/16   Angiulli, Mcarthur Rossetti, PA-C  ciprofloxacin (CIPRO) 500 MG tablet Take 1 tablet (500 mg total) by mouth 2 (two) times daily. 03/17/17   Santos-Sanchez, Chelsea Primus, MD  DULoxetine (CYMBALTA) 60 MG capsule Take 1 capsule (60 mg total) daily by mouth. 01/03/17   Gouru, Deanna Artis, MD  feeding supplement, GLUCERNA SHAKE, (GLUCERNA SHAKE) LIQD Take 237 mLs by mouth 2 (two) times daily between meals. 02/14/17   Angelita Ingles, MD  gabapentin (NEURONTIN) 100 MG capsule Take 1 capsule (100 mg total) 3 (three) times daily by mouth. Patient not taking: Reported on 03/14/2017 01/02/17   Ramonita Lab, MD  GLUCAGON HCL, RDNA, IJ Inject 1 mg into the muscle as needed (hypoglycemia-administer for CBG <60).    [provider]  heparin 5000 UNIT/ML injection Inject 5,000 Units into the skin daily.    [provider]  hydrALAZINE (APRESOLINE) 25 MG tablet Take 1 tablet (25 mg total) by mouth every 8 (eight) hours as needed (systolic goal 140). Patient not taking: Reported on 03/14/2017 02/14/17   Angelita Ingles, MD  insulin aspart (NOVOLOG) 100 UNIT/ML injection Inject 8 Units 3 (three) times daily with meals into the skin. Patient taking differently: Inject 8 Units into the skin See admin instructions. Pt uses 8 units subcutaneously three times daily before meals - [place] also uses sliding scale as needed throughout the day if 250-299=2 units, 300-349=3 units, 350-400=4 units >400 call MD 01/02/17   Ramonita Lab, MD  insulin aspart (NOVOLOG) 100 UNIT/ML injection Inject 0-15 Units into the skin 3 (three) times daily with meals. Patient not taking: Reported on 03/14/2017 02/14/17   Angelita Ingles, MD  insulin aspart (NOVOLOG) 100 UNIT/ML injection Inject 0-5 Units into the skin at bedtime. Patient not taking: Reported on 03/14/2017 02/14/17   Angelita Ingles, MD  insulin glargine (LANTUS) 100 UNIT/ML injection Inject 0.3 mLs (30 Units total) into the skin at bedtime. Patient  taking differently: Inject 36 Units into the skin at bedtime.  02/14/17   Angelita Ingles, MD  lisinopril (PRINIVIL,ZESTRIL) 20 MG tablet Take 1 tablet (20 mg total) by mouth daily. 02/15/17   Angelita Ingles, MD  magnesium hydroxide (MILK OF MAGNESIA) 400 MG/5ML suspension Take 30 mLs by mouth daily as needed for mild constipation. Patient taking differently: Take 2,400 mLs by mouth every 3 (three) days as needed for mild constipation.  02/14/17   Angelita Ingles, MD  metoprolol succinate (TOPROL-XL) 100 MG 24 hr tablet Take 1 tablet (100 mg total) by mouth daily. Take with or immediately following a meal. 02/15/17   Angelita Ingles, MD  mirtazapine (REMERON) 45 MG tablet Take 1 tablet (45 mg total) by mouth at bedtime. 02/14/17   Angelita Ingles, MD  ondansetron (ZOFRAN) 4 MG tablet Take 4 mg by mouth daily.    [provider]  pantoprazole (PROTONIX) 40 MG tablet Take 1 tablet (40 mg total) by mouth daily. Patient not taking: Reported on 03/14/2017 02/15/17   Angelita Ingles, MD  polyethylene glycol [facility] / Ethelene Hal) packet Take 17 g by mouth daily as needed for mild constipation, moderate constipation or severe constipation. 02/14/17   Angelita Ingles, MD  pregabalin (LYRICA) 100 MG capsule Take 1 capsule (100 mg total) by mouth 3 (three) times daily. 02/14/17   Angelita Ingles, MD  risperiDONE (RISPERDAL) 1 MG tablet Take 1 tablet (1 mg total) at bedtime by mouth. Patient not taking: Reported on 03/14/2017 01/02/17   Ramonita Lab, MD  tamsulosin (FLOMAX) 0.4 MG CAPS capsule Take 1 capsule (0.4 mg total) daily by mouth. Patient not taking: Reported on 03/14/2017 01/03/17   Ramonita Lab, MD  traMADol (ULTRAM) 50 MG tablet Take 1 tablet (50 mg total) by mouth every 6 (six) hours as needed for moderate pain or severe pain. 02/14/17   Angelita Ingles, MD  Vitamin D, Ergocalciferol, (DRISDOL) 50000 units CAPS capsule Take 1 capsule (50,000 Units total) by mouth every  7 (seven) days. 02/15/17   Angelita Ingles, MD  zolpidem (AMBIEN) 5 MG tablet Take 1 tablet (5 mg total) by mouth at bedtime as needed for sleep. Patient not taking: Reported on 03/14/2017 02/14/17   Angelita Ingles, MD    Inpatient medications: . famotidine  20 mg Intravenous Q24H  . fentaNYL      . fentaNYL (SUBLIMAZE) injection  100 mcg Intravenous Once  . insulin aspart  2-6 Units Subcutaneous Q4H  . insulin glargine  35 Units Subcutaneous Daily  . midazolam        Discontinued Meds:   Medications Discontinued During This Encounter  Medication Reason  . ceFEPIme (MAXIPIME) 250 mg in dextrose 5 % 50 mL IVPB Duplicate  . famotidine (PEPCID) IVPB 20 mg premix   . insulin detemir (LEVEMIR) injection 5 Units   . ceFEPIme (MAXIPIME) 1 g in sodium chloride 0.9 % 100 mL IVPB   . famotidine (PEPCID) IVPB 20 mg premix P&T Policy: Therapeutic Substitute  . insulin regular bolus via infusion 0-10 Units     Social History:  reports that  has never smoked. she has never used smokeless tobacco. She reports that she does  not drink alcohol or use drugs.  Family History:   Family History  Problem Relation Age of Onset  . Hypertension Mother   . Kidney disease Mother   . Hypertension Father   . Breast cancer Maternal Grandmother   . Prostate cancer Maternal Grandfather   . Ovarian cancer Paternal Grandmother   . Prostate cancer Paternal Grandfather   . Colon cancer Maternal Uncle        Family history of malignant neoplasm of gastrointestinal tract    Review of systems not obtained due to patient factors. Weight change:   Intake/Output Summary (Last 24 hours) at 04/13/2017 1229 Last data filed at 04/13/2017 1000 Gross per 24 hour  Intake 2935.24 ml  Output 140 ml  Net 2795.24 ml   BP (!) 103/57   Pulse 96   Temp (!) 100.9 F (38.3 C) (Axillary) Comment: RN Mardene Celeste aware  Resp (!) 24   Ht 5\' 5"  (1.651 m)   Wt 67.2 kg (148 lb 2.4 oz)   SpO2 100%   BMI 24.65 kg/m  Vitals:    04/13/17 0747 04/13/17 0800 04/13/17 0903 04/13/17 1149  BP:  (!) 103/57 (!) 103/57   Pulse:  95 (!) 103 96  Resp:  (!) 25 19 (!) 24  Temp: (!) 100.9 F (38.3 C)     TempSrc: Axillary     SpO2:  100% 98% 100%  Weight:      Height:         General appearance: intubated and sedated Head: Normocephalic, without obvious abnormality, atraumatic Resp: clear to auscultation bilaterally Cardio: no rub and tachycardic GI: soft, non-tender; bowel sounds normal; no masses,  no organomegaly Extremities: extremities normal, atraumatic, no cyanosis or edema  Labs: Basic Metabolic Panel: Recent Labs  Lab 04/12/17 1235 04/12/17 2047 04/13/17 0341  NA 139 141 141  K 5.2* 3.5 3.8  CL 102 110 110  CO2 17* 16* 18*  GLUCOSE 400* 329* 163*  BUN 60* 66* 73*  CREATININE 5.61* 5.34* 5.13*  ALBUMIN 3.0*  --   --   CALCIUM 10.1 9.4 9.2  PHOS  --   --  5.0*   Liver Function Tests: Recent Labs  Lab 04/12/17 1235  AST 93*  ALT 86*  ALKPHOS 89  BILITOT 1.3*  PROT 7.0  ALBUMIN 3.0*   No results for input(s): LIPASE, AMYLASE in the last 168 hours. No results for input(s): AMMONIA in the last 168 hours. CBC: Recent Labs  Lab 04/12/17 1235 04/13/17 0341  WBC 13.8* 10.4  NEUTROABS 11.6* 9.1*  HGB 12.2 10.1*  HCT 37.5 31.6*  MCV 91.7 91.6  PLT 230 188   PT/INR: @LABRCNTIP (inr:5) Cardiac Enzymes: ) Recent Labs  Lab 04/12/17 2047 04/13/17 0430  CKTOTAL 3,191* 2,304*   CBG: Recent Labs  Lab 04/13/17 0744 04/13/17 0902 04/13/17 1013 04/13/17 1124 04/13/17 1227  GLUCAP 133* 147* 153* 158* 152*    Iron Studies: No results for input(s): IRON, TIBC, TRANSFERRIN, FERRITIN in the last 168 hours.  Xrays/Other Studies: Ct Head Wo Contrast  Result Date: 04/12/2017 CLINICAL DATA:  Altered mental status. History of multiple sclerosis. EXAM: CT HEAD WITHOUT CONTRAST TECHNIQUE: Contiguous axial images were obtained from the base of the skull through the vertex without intravenous  contrast. COMPARISON:  MRI brain dated March 14, 2017. CT head dated March 14, 2017. FINDINGS: Brain: No evidence of acute infarction, hemorrhage, hydrocephalus, extra-axial collection or mass lesion/mass effect. Scattered areas of hypodensity in the periventricular deep white matter are not  significantly changed when compared to prior study, and are consistent with patient's history of multiple sclerosis. Vascular: Atherosclerotic vascular calcification of the carotid siphons. No hyperdense vessel. Skull: Normal. Negative for fracture or focal lesion. Sinuses/Orbits: Extensive paranasal sinus mucosal thickening. Small air-fluid level in the left sphenoid sinus. Findings are similar to prior study. No acute orbital finding. Other: None. IMPRESSION: 1.  No acute intracranial abnormality. 2. Stable findings related to multiple sclerosis. 3. Chronic paranasal sinus disease, not significantly changed. Electronically Signed   By: Obie Dredge M.D.   On: 04/12/2017 13:07   US Renal  Result Date: 04/12/2017 CLINICAL DATA:  Bladder outlet obstruction. EXAM: RENAL / URINARY TRACT ULTRASOUND COMPLETE COMPARISON:  CT scan 03/14/2017 FINDINGS: Right Kidney: Length: 12.5 cm. Mild fullness right intrarenal collecting system. Mild right hydroureter noted proximally. Echogenicity within normal limits. Left Kidney: Length: 11.2 cm. Echogenicity within normal limits. No mass or hydronephrosis visualized. Bladder: Foley catheter decompresses the urinary bladder. IMPRESSION: Mild right hydronephrosis with mild proximal right hydroureter. Electronically Signed   By: Kennith Center M.D.   On: 04/12/2017 19:01   Dg Chest Port 1 View  Result Date: 04/13/2017 CLINICAL DATA:  Intubation. EXAM: PORTABLE CHEST 1 VIEW COMPARISON:  Radiograph yesterday. FINDINGS: Endotracheal tube 2.9 cm from the carina. Enteric tube in place, tip below the diaphragm not included in the field of view. Mild cardiomegaly is unchanged. Low lung  volumes. No consolidation, pulmonary edema or pleural fluid. No pneumothorax. IMPRESSION: 1. Endotracheal tube 2.9 cm from the carina. Enteric tube in place, tip below the diaphragm not included in the field of view. 2. Low lung volumes. Electronically Signed   By: Rubye Oaks M.D.   On: 04/13/2017 05:14   Dg Chest Port 1 View  Result Date: 04/12/2017 CLINICAL DATA:  Altered mental status EXAM: PORTABLE CHEST 1 VIEW COMPARISON:  03/14/2017 FINDINGS: EKG leads create artifact across the chest. Chronic cardiomegaly. There is no edema, consolidation, effusion, or pneumothorax. IMPRESSION: 1. No evidence of active disease. 2. Chronic cardiomegaly. Electronically Signed   By: Marnee Spring M.D.   On: 04/12/2017 13:00     Assessment/Plan: 1.  AKI/CKD stage 3- multifactorial:  Obstructive uropathy from clogged chronic indwelling foley, ischemic ATN in setting of severe sepsis requiring pressors with concomitant ACE inhibition, and mild rhabdomyolysis.  She has been on pressors and receiving IVF's with improvement of BUN/Cr.  Her UOP has also improved after replacement of clogged foley catheter.  No indications for CRRT at this time and will continue to follow closely.  Continue with IVF's and pressor support as needed. 2. Hyperkalemia due to #1- resolved 3. Metabolic acidosis - due to #1:  Improving but may need bicarb.  Continue to follow. 4. VDRF- per PCCM 5. Septic shock from GNR bacteremia- on  6. Urosepsis- GNR bacteremia and UTI- presumably proteus, on aztreonam and cetriaxone (off of maxipime) per PCCM 7. DM type 1- per primary 8. Neurogenic bladder- new foley in place 9. Abnormal LFT's- possible shock liver 10. 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

## 2017-04-13 NOTE — Procedures (Signed)
Endotracheal Intubation Procedure Note  Indication for endotracheal intubation: impending airway compromise. Airway Assessment: Mallampati Class: I (soft palate, uvula, fauces, and tonsillar pillars visible). Sedation: fentanyl and midazolam. Paralytic: rocuronium. Lidocaine: no. Atropine: no. Equipment: Macintosh 4 laryngoscope blade and 7.30mm cuffed endotracheal tube. Cricoid Pressure: yes. Number of attempts: 1. ETT location confirmed by by auscultation, by CXR and ETCO2 monitor. ETT 22 at lip Confirmed position with CXR OGT inserted at time of intubation- ok to use  Debra [name] 04/13/2017

## 2017-04-13 NOTE — Progress Notes (Signed)
EEG report:  Description: The patient is described as being unresponsive and not able to follow commands.   Background is disorganized with a 4-5 Hz theta rhythm seen posteriorly, and intermittent frontal 2-3 Hz activity.  There is no well defined drowsiness or stage 2 sleep identified.  Intermittent generalized periodic discharges (GPDs), which are most prominent in the centro-parietal regions, are seen at an irregular frequency during the tracing, without a clinical correlate.  At times, interdischarge relative voltage suppression is noted.  Neither HV or photic stimulation were performed.  EKG was monitored and noted to be sinus tachycardia at 102 bpm. Impression: This is an abnormal EEG due to background slowing/disorganization, along with intermittent generalized periodic discharges (GPD's).  The slowing is a non-specific abnormality that can be seen with toxic, metabolic, diffuse, or multifocal structural abnormalities.   GPDs are associated with brain dysfunction and also non-specific in regards to etiology, being seen most commonly with toxic-metabolic encephalopathies, but can also be seen with hypoxic/ischemic, infectious, traumatic, and occasionally epileptic etiologies.   Although no definite epileptiform activity was seen during this study, intermittent seizures are not excluded. Continuous EEG monitoring recommended if there is clinical suspicion for seizures.  A/R: -Discussed the patient with Dr. Laurence Slate in sign out.  -Continue to monitor closely. -We will obtain LTM EEG -Continue to treat urosepsis. ID consult has been recommended.She is most likely immunocompromised from recent rituximab infusion required for aggressive MS.  -Would have low threshold to perform lumbar puncture if no response to antibiotics  Electronically signed: Dr. Caryl Pina

## 2017-04-13 NOTE — Progress Notes (Signed)
LTM EEG started 

## 2017-04-14 ENCOUNTER — Inpatient Hospital Stay (HOSPITAL_COMMUNITY): Payer: Medicaid Other

## 2017-04-14 DIAGNOSIS — L8990 Pressure ulcer of unspecified site, unspecified stage: Secondary | ICD-10-CM

## 2017-04-14 DIAGNOSIS — R339 Retention of urine, unspecified: Secondary | ICD-10-CM

## 2017-04-14 LAB — HEPATIC FUNCTION PANEL
ALBUMIN: 2 g/dL — AB (ref 3.5–5.0)
ALK PHOS: 100 U/L (ref 38–126)
ALT: 50 U/L (ref 14–54)
AST: 37 U/L (ref 15–41)
BILIRUBIN TOTAL: 0.6 mg/dL (ref 0.3–1.2)
Total Protein: 5.9 g/dL — ABNORMAL LOW (ref 6.5–8.1)

## 2017-04-14 LAB — BASIC METABOLIC PANEL
Anion gap: 15 (ref 5–15)
BUN: 79 mg/dL — AB (ref 6–20)
CO2: 16 mmol/L — ABNORMAL LOW (ref 22–32)
CREATININE: 4.18 mg/dL — AB (ref 0.44–1.00)
Calcium: 9 mg/dL (ref 8.9–10.3)
Chloride: 113 mmol/L — ABNORMAL HIGH (ref 101–111)
GFR calc Af Amer: 13 mL/min — ABNORMAL LOW (ref >60)
GFR calc non Af Amer: 11 mL/min — ABNORMAL LOW (ref >60)
GLUCOSE: 255 mg/dL — AB (ref 65–99)
POTASSIUM: 3.5 mmol/L (ref 3.5–5.1)
SODIUM: 144 mmol/L (ref 135–145)

## 2017-04-14 LAB — GLUCOSE, CAPILLARY
GLUCOSE-CAPILLARY: 171 mg/dL — AB (ref 65–99)
Glucose-Capillary: 236 mg/dL — ABNORMAL HIGH (ref 65–99)
Glucose-Capillary: 190 mg/dL — ABNORMAL HIGH (ref 65–99)
Glucose-Capillary: 170 mg/dL — ABNORMAL HIGH (ref 65–99)
Glucose-Capillary: 121 mg/dL — ABNORMAL HIGH (ref 65–99)

## 2017-04-14 LAB — CBC WITH DIFFERENTIAL/PLATELET
BASOS PCT: 0 %
Basophils Absolute: 0 10*3/uL (ref 0.0–0.1)
EOS PCT: 1 %
Eosinophils Absolute: 0.1 10*3/uL (ref 0.0–0.7)
HEMATOCRIT: 29.9 % — AB (ref 36.0–46.0)
HEMOGLOBIN: 10 g/dL — AB (ref 12.0–15.0)
LYMPHS ABS: 0.6 10*3/uL — AB (ref 0.7–4.0)
Lymphocytes Relative: 5 %
MCH: 29.8 pg (ref 26.0–34.0)
MCHC: 33.4 g/dL (ref 30.0–36.0)
MCV: 89 fL (ref 78.0–100.0)
MONO ABS: 0.6 10*3/uL (ref 0.1–1.0)
MONOS PCT: 5 %
NEUTROS ABS: 10.1 10*3/uL — AB (ref 1.7–7.7)
Neutrophils Relative %: 89 %
Platelets: 160 10*3/uL (ref 150–400)
RBC: 3.36 MIL/uL — ABNORMAL LOW (ref 3.87–5.11)
RDW: 15.5 % (ref 11.5–15.5)
WBC: 11.4 10*3/uL — ABNORMAL HIGH (ref 4.0–10.5)

## 2017-04-14 LAB — CK: Total CK: 1180 U/L — ABNORMAL HIGH (ref 38–234)

## 2017-04-14 LAB — LACTIC ACID, PLASMA: LACTIC ACID, VENOUS: 0.9 mmol/L (ref 0.5–1.9)

## 2017-04-14 LAB — MAGNESIUM: MAGNESIUM: 2.6 mg/dL — AB (ref 1.7–2.4)

## 2017-04-14 LAB — PROTIME-INR
INR: 1.24
Prothrombin Time: 15.5 seconds — ABNORMAL HIGH (ref 11.4–15.2)

## 2017-04-14 LAB — PHOSPHORUS: Phosphorus: 5.7 mg/dL — ABNORMAL HIGH (ref 2.5–4.6)

## 2017-04-14 MED ORDER — INSULIN ASPART 100 UNIT/ML ~~LOC~~ SOLN
0.0000 [IU] | SUBCUTANEOUS | Status: DC
  Administered 2017-04-14: 5 [IU] via SUBCUTANEOUS
  Administered 2017-04-14: 2 [IU] via SUBCUTANEOUS
  Administered 2017-04-14 (×3): 3 [IU] via SUBCUTANEOUS
  Administered 2017-04-14 – 2017-04-15 (×2): 5 [IU] via SUBCUTANEOUS
  Administered 2017-04-15: 2 [IU] via SUBCUTANEOUS
  Administered 2017-04-16 (×2): 5 [IU] via SUBCUTANEOUS
  Administered 2017-04-16: 3 [IU] via SUBCUTANEOUS
  Administered 2017-04-16: 2 [IU] via SUBCUTANEOUS
  Administered 2017-04-16: 5 [IU] via SUBCUTANEOUS
  Administered 2017-04-16: 3 [IU] via SUBCUTANEOUS
  Administered 2017-04-17: 5 [IU] via SUBCUTANEOUS
  Administered 2017-04-17 (×2): 3 [IU] via SUBCUTANEOUS
  Administered 2017-04-17: 5 [IU] via SUBCUTANEOUS
  Administered 2017-04-17 – 2017-04-19 (×4): 3 [IU] via SUBCUTANEOUS

## 2017-04-14 MED ORDER — HYDRALAZINE HCL 20 MG/ML IJ SOLN
10.0000 mg | Freq: Four times a day (QID) | INTRAMUSCULAR | Status: DC | PRN
  Administered 2017-04-14: 10 mg via INTRAVENOUS
  Administered 2017-04-15 – 2017-04-18 (×8): 20 mg via INTRAVENOUS
  Administered 2017-04-23: 10 mg via INTRAVENOUS
  Filled 2017-04-14 (×11): qty 1

## 2017-04-14 NOTE — Progress Notes (Signed)
LTM EEG discontinued. Electrodes removed, scalp cleansed, no skin break down noted.

## 2017-04-14 NOTE — Procedures (Signed)
LTM-EEG Report  HISTORY: Continuous video-EEG monitoring performed for 52 year old with sepsis, altered mental status.  ACQUISITION: International 10-20 system for electrode placement; 18 channels with additional eyes linked to ipsilateral ears and EKG. Additional T1-T2 electrodes were used. Continuous video recording obtained.   EEG NUMBER:  MEDICATIONS:  Day 1: no AED   DAY #1: from 0933 04/13/17 to 0730 04/14/17  BACKGROUND: An overall medium voltage continuous recording with poor spontaneous variability and reactivity. The record consisted primarily of medium voltage theta-delta activity bilaterally with sparse superimposed faster frequencies. State changes were present, however no clear sleep architecture or posterior basic rhythm was seen.  EPILEPTIFORM/PERIODIC ACTIVITY: There were occasional runs of generalized periodic discharges (GPDs), blunted in morphology, frequency 0.5-1Hz  without ictal evolution, increased with stimulation.  SEIZURES: none EVENTS: none  EKG: no significant arrhythmia  SUMMARY: This was a moderately abnormal continuous video EEG due to generalized slowing with stimulus-induced GPDs, indicative of a diffuse encephalopathy pattern. This is a non-specific finding and can be seen in toxic-metabolic encephalopathies. The GPDs did not have ictal evolution or an epileptiform appearance.

## 2017-04-14 NOTE — Progress Notes (Signed)
Subjective: EEG without electrographic seizures. LTM EEG discontinued.   Objective: Current vital signs: BP (!) 187/107   Pulse 94   Temp 100.1 F (37.8 C) (Axillary)   Resp (!) 27   Ht '5\' 5"'$  (1.651 m)   Wt 67 kg (147 lb 11.3 oz)   SpO2 100%   BMI 24.58 kg/m  Vital signs in last 24 hours: Temp:  [97.3 F (36.3 C)-101.4 F (38.6 C)] 100.1 F (37.8 C) (02/16 0937) Pulse Rate:  [77-96] 94 (02/16 0900) Resp:  [9-28] 27 (02/16 0900) BP: (71-189)/(52-108) 187/107 (02/16 0900) SpO2:  [100 %] 100 % (02/16 0900) FiO2 (%):  [30 %] 30 % (02/16 0745) Weight:  [67 kg (147 lb 11.3 oz)] 67 kg (147 lb 11.3 oz) (02/16 0414)  Intake/Output from previous day: 02/15 0701 - 02/16 0700 In: 3490.8 [I.V.:3250.8; NG/GT:60; IV Piggyback:100] Out: 950 [Urine:950] Intake/Output this shift: Total I/O In: 170 [I.V.:150; Other:20] Out: 1 [Stool:1] Nutritional status: Diet NPO time specified  HEENT: [place]/AT Ext: Warm and well perfused  Neurologic Exam: Mental Status: Eyes open. No spontaneous movement. Not responsive to auditory stimuli with no attempts to communicate. Does not gaze towards or away from visual stimuli. Slowly moves LUE towards chest after light sternal rub.  Cranial Nerves: II:  No blink to threat. Pupils equal and sluggishly reactive bilaterally.  III,IV, VI: Eyes at midline. Does not gaze towards or away from visual stimuli. No nystagmus.  V,VII: Face flaccidly symmetric. Does not react to light tactile stimulation.  VIII: No response to voice IX,X: Intubated XI: Unable to assess XII: Intubated Motor/Sensory: Decreased tone and bulk x 4.  Slowly moves LUE towards chest after light sternal rub, otherwise no movement of extremities to light noxious stimuli.  Deep Tendon Reflexes:  Brisk low amplitude reflexes x 4. Plantars mute bilaterally.  Cerebellar: Unable to assess. Gait: Unable to assess  Lab Results: Results for orders placed or performed during the hospital encounter  of 04/12/17 (from the past 48 hour(s))  Comprehensive metabolic panel     Status: Abnormal   Collection Time: 04/12/17 12:35 PM  Result Value Ref Range   Sodium 139 135 - 145 mmol/L   Potassium 5.2 (H) 3.5 - 5.1 mmol/L   Chloride 102 101 - 111 mmol/L   CO2 17 (L) 22 - 32 mmol/L   Glucose, Bld 400 (H) 65 - 99 mg/dL   BUN 60 (H) 6 - 20 mg/dL   Creatinine, Ser 5.61 (H) 0.44 - 1.00 mg/dL   Calcium 10.1 8.9 - 10.3 mg/dL   Total Protein 7.0 6.5 - 8.1 g/dL   Albumin 3.0 (L) 3.5 - 5.0 g/dL   AST 93 (H) 15 - 41 U/L   ALT 86 (H) 14 - 54 U/L   Alkaline Phosphatase 89 38 - 126 U/L   Total Bilirubin 1.3 (H) 0.3 - 1.2 mg/dL   GFR calc non Af Amer 8 (L) >60 mL/min   GFR calc Af Amer 9 (L) >60 mL/min    Comment: (NOTE) The eGFR has been calculated using the CKD EPI equation. This calculation has not been validated in all clinical situations. eGFR's persistently <60 mL/min signify possible Chronic Kidney Disease.    Anion gap 20 (H) 5 - 15    Comment: Performed at [facility], [place] [address], [place], [place] [address]  CBC WITH DIFFERENTIAL     Status: Abnormal   Collection Time: 04/12/17 12:35 PM  Result Value Ref Range   WBC 13.8 (H)  4.0 - 10.5 K/uL   RBC 4.09 3.87 - 5.11 MIL/uL   Hemoglobin 12.2 12.0 - 15.0 g/dL   HCT 37.5 36.0 - 46.0 %   MCV 91.7 78.0 - 100.0 fL   MCH 29.8 26.0 - 34.0 pg   MCHC 32.5 30.0 - 36.0 g/dL   RDW 15.6 (H) 11.5 - 15.5 %   Platelets 230 150 - 400 K/uL   Neutrophils Relative % 84 %   Lymphocytes Relative 8 %   Monocytes Relative 8 %   Eosinophils Relative 0 %   Basophils Relative 0 %   Neutro Abs 11.6 (H) 1.7 - 7.7 K/uL   Lymphs Abs 1.1 0.7 - 4.0 K/uL   Monocytes Absolute 1.1 (H) 0.1 - 1.0 K/uL   Eosinophils Absolute 0.0 0.0 - 0.7 K/uL   Basophils Absolute 0.0 0.0 - 0.1 K/uL   RBC Morphology POLYCHROMASIA PRESENT    WBC Morphology MILD LEFT SHIFT (1-5% METAS, OCC MYELO, OCC BANDS)     Comment: Performed at [facility] [address]  [place], [place] [address]  Blood Culture (routine x 2)     Status: None (Preliminary result)   Collection Time: 04/12/17 12:50 PM  Result Value Ref Range   Specimen Description BLOOD LEFT ANTECUBITAL    Special Requests      BOTTLES DRAWN AEROBIC AND ANAEROBIC Blood Culture adequate volume   Culture  Setup Time      GRAM NEGATIVE RODS IN BOTH AEROBIC AND ANAEROBIC BOTTLES CRITICAL RESULT CALLED TO, READ BACK BY AND VERIFIED WITH: Salli Real [id number] MLM Performed at [facility], [place] [address], [place], [place] [address]    Culture GRAM NEGATIVE RODS    Report Status PENDING   Blood Culture ID Panel (Reflexed)     Status: Abnormal   Collection Time: 04/12/17 12:50 PM  Result Value Ref Range   Enterococcus species NOT DETECTED NOT DETECTED   Listeria monocytogenes NOT DETECTED NOT DETECTED   Staphylococcus species NOT DETECTED NOT DETECTED   Staphylococcus aureus NOT DETECTED NOT DETECTED   Streptococcus species NOT DETECTED NOT DETECTED   Streptococcus agalactiae NOT DETECTED NOT DETECTED   Streptococcus pneumoniae NOT DETECTED NOT DETECTED   Streptococcus pyogenes NOT DETECTED NOT DETECTED   Acinetobacter baumannii NOT DETECTED NOT DETECTED   Enterobacteriaceae species DETECTED (A) NOT DETECTED    Comment: Enterobacteriaceae represent a large family of gram-negative bacteria, not a single organism. CRITICAL RESULT CALLED TO, READ BACK BY AND VERIFIED WITH: PHARMD G ABBOTT [id number] MLM    Enterobacter cloacae complex NOT DETECTED NOT DETECTED   Escherichia coli NOT DETECTED NOT DETECTED   Klebsiella oxytoca NOT DETECTED NOT DETECTED   Klebsiella pneumoniae NOT DETECTED NOT DETECTED   Proteus species DETECTED (A) NOT DETECTED    Comment: CRITICAL RESULT CALLED TO, READ BACK BY AND VERIFIED WITH: PHARMD G ABBOTT [phone] MLM    Serratia marcescens NOT DETECTED NOT DETECTED   Carbapenem resistance NOT DETECTED NOT DETECTED   Haemophilus influenzae NOT DETECTED  NOT DETECTED   Neisseria meningitidis NOT DETECTED NOT DETECTED   Pseudomonas aeruginosa NOT DETECTED NOT DETECTED   Candida albicans NOT DETECTED NOT DETECTED   Candida glabrata NOT DETECTED NOT DETECTED   Candida krusei NOT DETECTED NOT DETECTED   Candida parapsilosis NOT DETECTED NOT DETECTED   Candida tropicalis NOT DETECTED NOT DETECTED    Comment: Performed at [facility], [address], [place], [place] [address]  Blood  Culture (routine x 2)     Status: None (Preliminary result)   Collection Time: 04/12/17 12:52 PM  Result Value Ref Range   Specimen Description BLOOD LEFT FOREARM    Special Requests IN PEDIATRIC BOTTLE Blood Culture adequate volume    Culture  Setup Time      GRAM NEGATIVE RODS IN PEDIATRIC BOTTLE CRITICAL VALUE NOTED.  VALUE IS CONSISTENT WITH PREVIOUSLY REPORTED AND CALLED VALUE. Performed at [facility], [place] [address], [place], [place] [address]    Culture GRAM NEGATIVE RODS    Report Status PENDING   I-stat troponin, ED (not at [facility])     Status: None   Collection Time: 04/12/17  1:23 PM  Result Value Ref Range   Troponin i, poc 0.00 0.00 - 0.08 ng/mL   Comment 3            Comment: Due to the release kinetics of cTnI, a negative result within the first hours of the onset of symptoms does not rule out myocardial infarction with certainty. If myocardial infarction is still suspected, repeat the test at appropriate intervals.   I-Stat CG4 Lactic Acid, ED  (not at  [facility])     Status: Abnormal   Collection Time: 04/12/17  1:24 PM  Result Value Ref Range   Lactic Acid, Venous 3.56 (HH) 0.5 - 1.9 mmol/L   Comment NOTIFIED PHYSICIAN   Urinalysis, Routine w reflex microscopic     Status: Abnormal   Collection Time: 04/12/17  1:46 PM  Result Value Ref Range   Color, Urine AMBER (A) YELLOW   APPearance CLOUDY (A) CLEAR   Specific Gravity, Urine 1.011 1.005 - 1.030   pH 8.0 5.0 - 8.0   Glucose, UA NEGATIVE NEGATIVE mg/dL   Hgb urine  dipstick LARGE (A) NEGATIVE   Bilirubin Urine NEGATIVE NEGATIVE   Ketones, ur NEGATIVE NEGATIVE mg/dL   Protein, ur 100 (A) NEGATIVE mg/dL   Nitrite NEGATIVE NEGATIVE   Leukocytes, UA LARGE (A) NEGATIVE   RBC / HPF TOO NUMEROUS TO COUNT 0 - 5 RBC/hpf   WBC, UA TOO NUMEROUS TO COUNT 0 - 5 WBC/hpf   Bacteria, UA MANY (A) NONE SEEN   Squamous Epithelial / LPF NONE SEEN NONE SEEN   WBC Clumps PRESENT     Comment: Performed at [facility], [address] [place], [place] [address]  Urine culture     Status: Abnormal (Preliminary result)   Collection Time: 04/12/17  1:46 PM  Result Value Ref Range   Specimen Description URINE, RANDOM    Special Requests NONE    Culture (A)     >=100,000 COLONIES/mL PROTEUS MIRABILIS CULTURE REINCUBATED FOR BETTER GROWTH Performed at [facility], [place] [address], [place], [place] [address]    Report Status PENDING   I-Stat venous blood gas, ED ([facility])     Status: Abnormal   Collection Time: 04/12/17  2:06 PM  Result Value Ref Range   pH, Ven 7.388 7.250 - 7.430   pCO2, Ven 27.8 (L) 44.0 - 60.0 mmHg   pO2, Ven 53.0 (H) 32.0 - 45.0 mmHg   Bicarbonate 16.8 (L) 20.0 - 28.0 mmol/L   TCO2 18 (L) 22 - 32 mmol/L   O2 Saturation 87.0 %   Acid-base deficit 7.0 (H) 0.0 - 2.0 mmol/L   Patient temperature HIDE    Sample type VENOUS   Protime-INR     Status: Abnormal   Collection Time: 04/12/17  3:19  PM  Result Value Ref Range   Prothrombin Time 18.3 (H) 11.4 - 15.2 seconds   INR 1.53     Comment: Performed at [facility] [address] [place],  [address]  I-Stat CG4 Lactic Acid, ED  (not at  [facility])     Status: Abnormal   Collection Time: 04/12/17  3:34 PM  Result Value Ref Range   Lactic Acid, Venous 2.83 (HH) 0.5 - 1.9 mmol/L   Comment NOTIFIED PHYSICIAN   CBG monitoring, ED     Status: Abnormal   Collection Time: 04/12/17  4:01 PM  Result Value Ref Range   Glucose-Capillary 392 (H) 65 - 99 mg/dL  CBG monitoring, ED      Status: Abnormal   Collection Time: 04/12/17  5:51 PM  Result Value Ref Range   Glucose-Capillary 420 (H) 65 - 99 mg/dL   Comment 1 Notify RN    Comment 2 Document in Chart   CBG monitoring, ED     Status: Abnormal   Collection Time: 04/12/17  7:08 PM  Result Value Ref Range   Glucose-Capillary 392 (H) 65 - 99 mg/dL  Glucose, capillary     Status: Abnormal   Collection Time: 04/12/17  8:22 PM  Result Value Ref Range   Glucose-Capillary 329 (H) 65 - 99 mg/dL   Comment 1 Capillary Specimen   Procalcitonin     Status: None   Collection Time: 04/12/17  8:47 PM  Result Value Ref Range   Procalcitonin >150.00 ng/mL    Comment:        Interpretation: PCT >= 10 ng/mL: Important systemic inflammatory response, almost exclusively due to severe bacterial sepsis or septic shock. (NOTE)       Sepsis PCT Algorithm           Lower Respiratory Tract                                      Infection PCT Algorithm    ----------------------------     ----------------------------         PCT < 0.25 ng/mL                PCT < 0.10 ng/mL         Strongly encourage             Strongly discourage   discontinuation of antibiotics    initiation of antibiotics    ----------------------------     -----------------------------       PCT 0.25 - 0.50 ng/mL            PCT 0.10 - 0.25 ng/mL               OR       >80% decrease in PCT            Discourage initiation of                                            antibiotics      Encourage discontinuation           of antibiotics    ----------------------------     -----------------------------         PCT >= 0.50 ng/mL  PCT 0.26 - 0.50 ng/mL                AND       <80% decrease in PCT             Encourage initiation of                                             antibiotics       Encourage continuation           of antibiotics    ----------------------------     -----------------------------        PCT >= 0.50 ng/mL                  PCT >  0.50 ng/mL               AND         increase in PCT                  Strongly encourage                                      initiation of antibiotics    Strongly encourage escalation           of antibiotics                                     -----------------------------                                           PCT <= 0.25 ng/mL                                                 OR                                        > 80% decrease in PCT                                     Discontinue / Do not initiate                                             antibiotics Performed at [facility], [address] [place], [place] [address]   Cortisol     Status: None   Collection Time: 04/12/17  8:47 PM  Result Value Ref Range   Cortisol, Plasma 46.1 ug/dL    Comment: (NOTE) AM    6.7 - 22.6 ug/dL PM   <10.0       ug/dL Performed at [facility] [address] [place], [place] [address]   Basic metabolic  panel     Status: Abnormal   Collection Time: 04/12/17  8:47 PM  Result Value Ref Range   Sodium 141 135 - 145 mmol/L   Potassium 3.5 3.5 - 5.1 mmol/L   Chloride 110 101 - 111 mmol/L   CO2 16 (L) 22 - 32 mmol/L   Glucose, Bld 329 (H) 65 - 99 mg/dL   BUN 66 (H) 6 - 20 mg/dL   Creatinine, Ser 5.34 (H) 0.44 - 1.00 mg/dL   Calcium 9.4 8.9 - 10.3 mg/dL   GFR calc non Af Amer 9 (L) >60 mL/min   GFR calc Af Amer 10 (L) >60 mL/min    Comment: (NOTE) The eGFR has been calculated using the CKD EPI equation. This calculation has not been validated in all clinical situations. eGFR's persistently <60 mL/min signify possible Chronic Kidney Disease.    Anion gap 15 5 - 15    Comment: Performed at [facility] [address], [place], [place] [address]  CK     Status: Abnormal   Collection Time: 04/12/17  8:47 PM  Result Value Ref Range   Total CK 3,191 (H) 38 - 234 U/L    Comment: Performed at [facility], [place] [address], [place], [place] [address]  Glucose,  capillary     Status: Abnormal   Collection Time: 04/12/17  9:36 PM  Result Value Ref Range   Glucose-Capillary 306 (H) 65 - 99 mg/dL   Comment 1 Notify RN   MRSA PCR Screening     Status: None   Collection Time: 04/12/17  9:51 PM  Result Value Ref Range   MRSA by PCR NEGATIVE NEGATIVE    Comment:        The GeneXpert MRSA Assay (FDA approved for NASAL specimens only), is one component of a comprehensive MRSA colonization surveillance program. It is not intended to diagnose MRSA infection nor to guide or monitor treatment for MRSA infections. Performed at [facility], [place] [address] [place], [place] [address]   Glucose, capillary     Status: Abnormal   Collection Time: 04/12/17 10:34 PM  Result Value Ref Range   Glucose-Capillary 272 (H) 65 - 99 mg/dL   Comment 1 Notify RN   Glucose, capillary     Status: Abnormal   Collection Time: 04/12/17 11:34 PM  Result Value Ref Range   Glucose-Capillary 239 (H) 65 - 99 mg/dL   Comment 1 Notify RN   Glucose, capillary     Status: Abnormal   Collection Time: 04/13/17 12:32 AM  Result Value Ref Range   Glucose-Capillary 217 (H) 65 - 99 mg/dL   Comment 1 Notify RN   Glucose, capillary     Status: Abnormal   Collection Time: 04/13/17  1:35 AM  Result Value Ref Range   Glucose-Capillary 193 (H) 65 - 99 mg/dL   Comment 1 Notify RN   Glucose, capillary     Status: Abnormal   Collection Time: 04/13/17  2:43 AM  Result Value Ref Range   Glucose-Capillary 181 (H) 65 - 99 mg/dL   Comment 1 Notify RN   Basic metabolic panel     Status: Abnormal   Collection Time: 04/13/17  3:41 AM  Result Value Ref Range   Sodium 141 135 - 145 mmol/L   Potassium 3.8 3.5 - 5.1 mmol/L   Chloride 110 101 - 111 mmol/L   CO2 18 (L) 22 - 32 mmol/L   Glucose, Bld 163 (H) 65 -  99 mg/dL   BUN 73 (H) 6 - 20 mg/dL   Creatinine, Ser 5.13 (H) 0.44 - 1.00 mg/dL   Calcium 9.2 8.9 - 10.3 mg/dL   GFR calc non Af Amer 9 (L) >60 mL/min   GFR calc Af Amer 10  (L) >60 mL/min    Comment: (NOTE) The eGFR has been calculated using the CKD EPI equation. This calculation has not been validated in all clinical situations. eGFR's persistently <60 mL/min signify possible Chronic Kidney Disease.    Anion gap 13 5 - 15    Comment: Performed at [facility] [address] [place], [place] [address]  Magnesium     Status: Abnormal   Collection Time: 04/13/17  3:41 AM  Result Value Ref Range   Magnesium 2.5 (H) 1.7 - 2.4 mg/dL    Comment: Performed at [facility] [address], [place], [place] [address]  Phosphorus     Status: Abnormal   Collection Time: 04/13/17  3:41 AM  Result Value Ref Range   Phosphorus 5.0 (H) 2.5 - 4.6 mg/dL    Comment: Performed at [facility] [address] [place], [place] [address]  CBC WITH DIFFERENTIAL     Status: Abnormal   Collection Time: 04/13/17  3:41 AM  Result Value Ref Range   WBC 10.4 4.0 - 10.5 K/uL   RBC 3.45 (L) 3.87 - 5.11 MIL/uL   Hemoglobin 10.1 (L) 12.0 - 15.0 g/dL   HCT 31.6 (L) 36.0 - 46.0 %   MCV 91.6 78.0 - 100.0 fL   MCH 29.3 26.0 - 34.0 pg   MCHC 32.0 30.0 - 36.0 g/dL   RDW 15.6 (H) 11.5 - 15.5 %   Platelets 188 150 - 400 K/uL   Neutrophils Relative % 87 %   Lymphocytes Relative 8 %   Monocytes Relative 5 %   Eosinophils Relative 0 %   Basophils Relative 0 %   Neutro Abs 9.1 (H) 1.7 - 7.7 K/uL   Lymphs Abs 0.8 0.7 - 4.0 K/uL   Monocytes Absolute 0.5 0.1 - 1.0 K/uL   Eosinophils Absolute 0.0 0.0 - 0.7 K/uL   Basophils Absolute 0.0 0.0 - 0.1 K/uL   WBC Morphology MILD LEFT SHIFT (1-5% METAS, OCC MYELO, OCC BANDS)     Comment: DOHLE BODIES Performed at [facility], [address], [place], [place] [address]   Lactic acid, plasma     Status: Abnormal   Collection Time: 04/13/17  3:41 AM  Result Value Ref Range   Lactic Acid, Venous 2.8 (HH) 0.5 - 1.9 mmol/L    Comment: CRITICAL RESULT CALLED TO, READ BACK BY AND VERIFIED WITH: FLYNT [facility] 04/13/17 0449  WAYK Performed at [facility], [place] [address] [place], [place] [address]   I-STAT 3, arterial blood gas (G3+)     Status: Abnormal   Collection Time: 04/13/17  3:41 AM  Result Value Ref Range   pH, Arterial 7.357 7.350 - 7.450   pCO2 arterial 27.9 (L) 32.0 - 48.0 mmHg   pO2, Arterial 82.0 (L) 83.0 - 108.0 mmHg   Bicarbonate 15.8 (L) 20.0 - 28.0 mmol/L   TCO2 17 (L) 22 - 32 mmol/L   O2 Saturation 96.0 %   Acid-base deficit 9.0 (H) 0.0 - 2.0 mmol/L   Patient temperature 97.7 F    Sample type ARTERIAL   Glucose, capillary     Status: Abnormal   Collection Time: 04/13/17  3:42  AM  Result Value Ref Range   Glucose-Capillary 160 (H) 65 - 99 mg/dL   Comment 1 Notify RN   CK     Status: Abnormal   Collection Time: 04/13/17  4:30 AM  Result Value Ref Range   Total CK 2,304 (H) 38 - 234 U/L    Comment: Performed at [facility], [place] [address] [place], [place] [address]  Glucose, capillary     Status: Abnormal   Collection Time: 04/13/17  4:38 AM  Result Value Ref Range   Glucose-Capillary 128 (H) 65 - 99 mg/dL   Comment 1 Capillary Specimen   Culture, respiratory (NON-Expectorated)     Status: None (Preliminary result)   Collection Time: 04/13/17  5:14 AM  Result Value Ref Range   Specimen Description TRACHEAL ASPIRATE    Special Requests NONE    Gram Stain      FEW WBC PRESENT, PREDOMINANTLY PMN MODERATE GRAM POSITIVE COCCI IN PAIRS MODERATE GRAM NEGATIVE RODS FEW GRAM POSITIVE RODS Performed at [facility], [place] [address], [place],  [address]    Culture PENDING    Report Status PENDING   Gastrointestinal Panel by PCR , Stool     Status: None   Collection Time: 04/13/17  5:45 AM  Result Value Ref Range   Campylobacter species NOT DETECTED NOT DETECTED   Plesimonas shigelloides NOT DETECTED NOT DETECTED   Salmonella species NOT DETECTED NOT DETECTED   Yersinia enterocolitica NOT DETECTED NOT DETECTED   Vibrio species NOT DETECTED NOT DETECTED    Vibrio cholerae NOT DETECTED NOT DETECTED   Enteroaggregative E coli (EAEC) NOT DETECTED NOT DETECTED   Enteropathogenic E coli (EPEC) NOT DETECTED NOT DETECTED   Enterotoxigenic E coli (ETEC) NOT DETECTED NOT DETECTED   Shiga like toxin producing E coli (STEC) NOT DETECTED NOT DETECTED   Shigella/Enteroinvasive E coli (EIEC) NOT DETECTED NOT DETECTED   Cryptosporidium NOT DETECTED NOT DETECTED   Cyclospora cayetanensis NOT DETECTED NOT DETECTED   Entamoeba histolytica NOT DETECTED NOT DETECTED   Giardia lamblia NOT DETECTED NOT DETECTED   Adenovirus F40/41 NOT DETECTED NOT DETECTED   Astrovirus NOT DETECTED NOT DETECTED   Norovirus GI/GII NOT DETECTED NOT DETECTED   Rotavirus A NOT DETECTED NOT DETECTED   Sapovirus (I, II, IV, and V) NOT DETECTED NOT DETECTED    Comment: Performed at [facility], [place] [place], [place] [address]  Glucose, capillary     Status: Abnormal   Collection Time: 04/13/17  5:45 AM  Result Value Ref Range   Glucose-Capillary 113 (H) 65 - 99 mg/dL   Comment 1 Capillary Specimen   I-STAT 3, arterial blood gas (G3+)     Status: Abnormal   Collection Time: 04/13/17  5:55 AM  Result Value Ref Range   pH, Arterial 7.239 (L) 7.350 - 7.450   pCO2 arterial 49.5 (H) 32.0 - 48.0 mmHg   pO2, Arterial 496.0 (H) 83.0 - 108.0 mmHg   Bicarbonate 21.0 20.0 - 28.0 mmol/L   TCO2 22 22 - 32 mmol/L   O2 Saturation 100.0 %   Acid-base deficit 6.0 (H) 0.0 - 2.0 mmol/L   Patient temperature 99.5 F    Sample type ARTERIAL   Glucose, capillary     Status: Abnormal   Collection Time: 04/13/17  6:35 AM  Result Value Ref Range   Glucose-Capillary 119 (H) 65 - 99 mg/dL   Comment 1 Notify RN   Glucose, capillary  Status: Abnormal   Collection Time: 04/13/17  7:44 AM  Result Value Ref Range   Glucose-Capillary 133 (H) 65 - 99 mg/dL   Comment 1 Capillary Specimen    Comment 2 Notify RN   Glucose, capillary     Status: Abnormal   Collection Time: 04/13/17   9:02 AM  Result Value Ref Range   Glucose-Capillary 147 (H) 65 - 99 mg/dL   Comment 1 Notify RN   Glucose, capillary     Status: Abnormal   Collection Time: 04/13/17 10:13 AM  Result Value Ref Range   Glucose-Capillary 153 (H) 65 - 99 mg/dL  Glucose, capillary     Status: Abnormal   Collection Time: 04/13/17 11:24 AM  Result Value Ref Range   Glucose-Capillary 158 (H) 65 - 99 mg/dL  Glucose, capillary     Status: Abnormal   Collection Time: 04/13/17 12:27 PM  Result Value Ref Range   Glucose-Capillary 152 (H) 65 - 99 mg/dL   Comment 1 Notify RN   Glucose, capillary     Status: Abnormal   Collection Time: 04/13/17  4:05 PM  Result Value Ref Range   Glucose-Capillary 158 (H) 65 - 99 mg/dL   Comment 1 Capillary Specimen    Comment 2 Notify RN   Basic metabolic panel     Status: Abnormal   Collection Time: 04/13/17  6:57 PM  Result Value Ref Range   Sodium 143 135 - 145 mmol/L   Potassium 3.9 3.5 - 5.1 mmol/L   Chloride 114 (H) 101 - 111 mmol/L   CO2 16 (L) 22 - 32 mmol/L   Glucose, Bld 171 (H) 65 - 99 mg/dL   BUN 79 (H) 6 - 20 mg/dL   Creatinine, Ser 4.95 (H) 0.44 - 1.00 mg/dL   Calcium 8.8 (L) 8.9 - 10.3 mg/dL   GFR calc non Af Amer 9 (L) >60 mL/min   GFR calc Af Amer 11 (L) >60 mL/min    Comment: (NOTE) The eGFR has been calculated using the CKD EPI equation. This calculation has not been validated in all clinical situations. eGFR's persistently <60 mL/min signify possible Chronic Kidney Disease.    Anion gap 13 5 - 15    Comment: Performed at [facility] [address], [place], [place] [address]  Glucose, capillary     Status: Abnormal   Collection Time: 04/13/17  9:09 PM  Result Value Ref Range   Glucose-Capillary 187 (H) 65 - 99 mg/dL   Comment 1 Notify RN   Glucose, capillary     Status: Abnormal   Collection Time: 04/13/17 11:56 PM  Result Value Ref Range   Glucose-Capillary 228 (H) 65 - 99 mg/dL   Comment 1 Capillary Specimen    Comment 2 Notify RN    Basic metabolic panel     Status: Abnormal   Collection Time: 04/14/17  2:42 AM  Result Value Ref Range   Sodium 144 135 - 145 mmol/L   Potassium 3.5 3.5 - 5.1 mmol/L   Chloride 113 (H) 101 - 111 mmol/L   CO2 16 (L) 22 - 32 mmol/L   Glucose, Bld 255 (H) 65 - 99 mg/dL   BUN 79 (H) 6 - 20 mg/dL   Creatinine, Ser 4.18 (H) 0.44 - 1.00 mg/dL   Calcium 9.0 8.9 - 10.3 mg/dL   GFR calc non Af Amer 11 (L) >60 mL/min   GFR calc Af Amer 13 (L) >60 mL/min    Comment: (NOTE) The eGFR  has been calculated using the CKD EPI equation. This calculation has not been validated in all clinical situations. eGFR's persistently <60 mL/min signify possible Chronic Kidney Disease.    Anion gap 15 5 - 15    Comment: Performed at [facility] [address], [place], [place] [address]  CBC WITH DIFFERENTIAL     Status: Abnormal   Collection Time: 04/14/17  2:42 AM  Result Value Ref Range   WBC 11.4 (H) 4.0 - 10.5 K/uL   RBC 3.36 (L) 3.87 - 5.11 MIL/uL   Hemoglobin 10.0 (L) 12.0 - 15.0 g/dL   HCT 29.9 (L) 36.0 - 46.0 %   MCV 89.0 78.0 - 100.0 fL   MCH 29.8 26.0 - 34.0 pg   MCHC 33.4 30.0 - 36.0 g/dL   RDW 15.5 11.5 - 15.5 %   Platelets 160 150 - 400 K/uL   Neutrophils Relative % 89 %   Lymphocytes Relative 5 %   Monocytes Relative 5 %   Eosinophils Relative 1 %   Basophils Relative 0 %   Neutro Abs 10.1 (H) 1.7 - 7.7 K/uL   Lymphs Abs 0.6 (L) 0.7 - 4.0 K/uL   Monocytes Absolute 0.6 0.1 - 1.0 K/uL   Eosinophils Absolute 0.1 0.0 - 0.7 K/uL   Basophils Absolute 0.0 0.0 - 0.1 K/uL   RBC Morphology RARE NRBCs    WBC Morphology MILD LEFT SHIFT (1-5% METAS, OCC MYELO, OCC BANDS)     Comment: DOHLE BODIES Performed at [facility], [address], [place], [place] [address]   Hepatic function panel     Status: Abnormal   Collection Time: 04/14/17  2:42 AM  Result Value Ref Range   Total Protein 5.9 (L) 6.5 - 8.1 g/dL   Albumin 2.0 (L) 3.5 - 5.0 g/dL   AST 37 15 - 41 U/L   ALT 50 14 -  54 U/L   Alkaline Phosphatase 100 38 - 126 U/L   Total Bilirubin 0.6 0.3 - 1.2 mg/dL   Bilirubin, Direct <0.1 (L) 0.1 - 0.5 mg/dL   Indirect Bilirubin NOT CALCULATED 0.3 - 0.9 mg/dL    Comment: Performed at [facility] [address] [place], [place] [address]  Lactic acid, plasma     Status: None   Collection Time: 04/14/17  2:42 AM  Result Value Ref Range   Lactic Acid, Venous 0.9 0.5 - 1.9 mmol/L    Comment: Performed at [facility] [address], [place], [place] [address]  Magnesium     Status: Abnormal   Collection Time: 04/14/17  2:42 AM  Result Value Ref Range   Magnesium 2.6 (H) 1.7 - 2.4 mg/dL    Comment: Performed at [facility] [address], [place], [place] [address]  Phosphorus     Status: Abnormal   Collection Time: 04/14/17  2:42 AM  Result Value Ref Range   Phosphorus 5.7 (H) 2.5 - 4.6 mg/dL    Comment: Performed at [facility] [address] [place], [place] [address]  Protime-INR     Status: Abnormal   Collection Time: 04/14/17  2:42 AM  Result Value Ref Range   Prothrombin Time 15.5 (H) 11.4 - 15.2 seconds   INR 1.24     Comment: Performed at [facility] [address], [place], [place] [address]  CK     Status: Abnormal   Collection Time: 04/14/17  2:42 AM  Result  Value Ref Range   Total CK 1,180 (H) 38 - 234 U/L    Comment: Performed at [facility], [place] [address], [place], [place] [address]  Glucose, capillary     Status: Abnormal   Collection Time: 04/14/17  3:40 AM  Result Value Ref Range   Glucose-Capillary 236 (H) 65 - 99 mg/dL   Comment 1 Capillary Specimen    Comment 2 Notify RN   Glucose, capillary     Status: Abnormal   Collection Time: 04/14/17  9:32 AM  Result Value Ref Range   Glucose-Capillary 190 (H) 65 - 99 mg/dL   Comment 1 Capillary Specimen    Comment 2 Notify RN     Recent Results (from the past 240 hour(s))  Blood Culture (routine x 2)     Status: None (Preliminary result)    Collection Time: 04/12/17 12:50 PM  Result Value Ref Range Status   Specimen Description BLOOD LEFT ANTECUBITAL  Final   Special Requests   Final    BOTTLES DRAWN AEROBIC AND ANAEROBIC Blood Culture adequate volume   Culture  Setup Time   Final    GRAM NEGATIVE RODS IN BOTH AEROBIC AND ANAEROBIC BOTTLES CRITICAL RESULT CALLED TO, READ BACK BY AND VERIFIED WITH: Salli Real [id number] MLM Performed at [facility], [place] [address] [place], [place] [address]    Culture GRAM NEGATIVE RODS  Final   Report Status PENDING  Incomplete  Blood Culture ID Panel (Reflexed)     Status: Abnormal   Collection Time: 04/12/17 12:50 PM  Result Value Ref Range Status   Enterococcus species NOT DETECTED NOT DETECTED Final   Listeria monocytogenes NOT DETECTED NOT DETECTED Final   Staphylococcus species NOT DETECTED NOT DETECTED Final   Staphylococcus aureus NOT DETECTED NOT DETECTED Final   Streptococcus species NOT DETECTED NOT DETECTED Final   Streptococcus agalactiae NOT DETECTED NOT DETECTED Final   Streptococcus pneumoniae NOT DETECTED NOT DETECTED Final   Streptococcus pyogenes NOT DETECTED NOT DETECTED Final   Acinetobacter baumannii NOT DETECTED NOT DETECTED Final   Enterobacteriaceae species DETECTED (A) NOT DETECTED Final    Comment: Enterobacteriaceae represent a large family of gram-negative bacteria, not a single organism. CRITICAL RESULT CALLED TO, READ BACK BY AND VERIFIED WITH: PHARMD G ABBOTT [phone] MLM    Enterobacter cloacae complex NOT DETECTED NOT DETECTED Final   Escherichia coli NOT DETECTED NOT DETECTED Final   Klebsiella oxytoca NOT DETECTED NOT DETECTED Final   Klebsiella pneumoniae NOT DETECTED NOT DETECTED Final   Proteus species DETECTED (A) NOT DETECTED Final    Comment: CRITICAL RESULT CALLED TO, READ BACK BY AND VERIFIED WITH: PHARMD G ABBOTT [phone] MLM    Serratia marcescens NOT DETECTED NOT DETECTED Final   Carbapenem resistance NOT DETECTED  NOT DETECTED Final   Haemophilus influenzae NOT DETECTED NOT DETECTED Final   Neisseria meningitidis NOT DETECTED NOT DETECTED Final   Pseudomonas aeruginosa NOT DETECTED NOT DETECTED Final   Candida albicans NOT DETECTED NOT DETECTED Final   Candida glabrata NOT DETECTED NOT DETECTED Final   Candida krusei NOT DETECTED NOT DETECTED Final   Candida parapsilosis NOT DETECTED NOT DETECTED Final   Candida tropicalis NOT DETECTED NOT DETECTED Final    Comment: Performed at [facility], [address] [place], [place] [address]  Blood Culture (routine x 2)     Status: None (Preliminary result)   Collection Time: 04/12/17 12:52 PM  Result Value Ref  Range Status   Specimen Description BLOOD LEFT FOREARM  Final   Special Requests IN PEDIATRIC BOTTLE Blood Culture adequate volume  Final   Culture  Setup Time   Final    GRAM NEGATIVE RODS IN PEDIATRIC BOTTLE CRITICAL VALUE NOTED.  VALUE IS CONSISTENT WITH PREVIOUSLY REPORTED AND CALLED VALUE. Performed at [facility], [place] [address], [place], [place] [address]    Culture GRAM NEGATIVE RODS  Final   Report Status PENDING  Incomplete  Urine culture     Status: Abnormal (Preliminary result)   Collection Time: 04/12/17  1:46 PM  Result Value Ref Range Status   Specimen Description URINE, RANDOM  Final   Special Requests NONE  Final   Culture (A)  Final    >=100,000 COLONIES/mL PROTEUS MIRABILIS CULTURE REINCUBATED FOR BETTER GROWTH Performed at [facility], [address], [place], [place] [address]    Report Status PENDING  Incomplete  MRSA PCR Screening     Status: None   Collection Time: 04/12/17  9:51 PM  Result Value Ref Range Status   MRSA by PCR NEGATIVE NEGATIVE Final    Comment:        The GeneXpert MRSA Assay (FDA approved for NASAL specimens only), is one component of a comprehensive MRSA colonization surveillance program. It is not intended to diagnose MRSA infection nor to guide or monitor treatment  for MRSA infections. Performed at [facility], [place] [address] [place], [place] [address]   Culture, respiratory (NON-Expectorated)     Status: None (Preliminary result)   Collection Time: 04/13/17  5:14 AM  Result Value Ref Range Status   Specimen Description TRACHEAL ASPIRATE  Final   Special Requests NONE  Final   Gram Stain   Final    FEW WBC PRESENT, PREDOMINANTLY PMN MODERATE GRAM POSITIVE COCCI IN PAIRS MODERATE GRAM NEGATIVE RODS FEW GRAM POSITIVE RODS Performed at [facility], [place] [address] [place], [place] [address]    Culture PENDING  Incomplete   Report Status PENDING  Incomplete  Gastrointestinal Panel by PCR , Stool     Status: None   Collection Time: 04/13/17  5:45 AM  Result Value Ref Range Status   Campylobacter species NOT DETECTED NOT DETECTED Final   Plesimonas shigelloides NOT DETECTED NOT DETECTED Final   Salmonella species NOT DETECTED NOT DETECTED Final   Yersinia enterocolitica NOT DETECTED NOT DETECTED Final   Vibrio species NOT DETECTED NOT DETECTED Final   Vibrio cholerae NOT DETECTED NOT DETECTED Final   Enteroaggregative E coli (EAEC) NOT DETECTED NOT DETECTED Final   Enteropathogenic E coli (EPEC) NOT DETECTED NOT DETECTED Final   Enterotoxigenic E coli (ETEC) NOT DETECTED NOT DETECTED Final   Shiga like toxin producing E coli (STEC) NOT DETECTED NOT DETECTED Final   Shigella/Enteroinvasive E coli (EIEC) NOT DETECTED NOT DETECTED Final   Cryptosporidium NOT DETECTED NOT DETECTED Final   Cyclospora cayetanensis NOT DETECTED NOT DETECTED Final   Entamoeba histolytica NOT DETECTED NOT DETECTED Final   Giardia lamblia NOT DETECTED NOT DETECTED Final   Adenovirus F40/41 NOT DETECTED NOT DETECTED Final   Astrovirus NOT DETECTED NOT DETECTED Final   Norovirus GI/GII NOT DETECTED NOT DETECTED Final   Rotavirus A NOT DETECTED NOT DETECTED Final   Sapovirus (I, II, IV, and V) NOT DETECTED NOT DETECTED Final    Comment: Performed at  [facility], [address] [place], [place] [address]    Lipid Panel No  results for input(s): CHOL, TRIG, HDL, CHOLHDL, VLDL, LDLCALC in the last 72 hours.  Studies/Results: Ct Head Wo Contrast  Result Date: 04/12/2017 CLINICAL DATA:  Altered mental status. History of multiple sclerosis. EXAM: CT HEAD WITHOUT CONTRAST TECHNIQUE: Contiguous axial images were obtained from the base of the skull through the vertex without intravenous contrast. COMPARISON:  MRI brain dated March 14, 2017. CT head dated March 14, 2017. FINDINGS: Brain: No evidence of acute infarction, hemorrhage, hydrocephalus, extra-axial collection or mass lesion/mass effect. Scattered areas of hypodensity in the periventricular deep white matter are not significantly changed when compared to prior study, and are consistent with patient's history of multiple sclerosis. Vascular: Atherosclerotic vascular calcification of the carotid siphons. No hyperdense vessel. Skull: Normal. Negative for fracture or focal lesion. Sinuses/Orbits: Extensive paranasal sinus mucosal thickening. Small air-fluid level in the left sphenoid sinus. Findings are similar to prior study. No acute orbital finding. Other: None. IMPRESSION: 1.  No acute intracranial abnormality. 2. Stable findings related to multiple sclerosis. 3. Chronic paranasal sinus disease, not significantly changed. Electronically Signed   By: Titus Dubin M.D.   On: 04/12/2017 13:07   US Renal  Result Date: 04/12/2017 CLINICAL DATA:  Bladder outlet obstruction. EXAM: RENAL / URINARY TRACT ULTRASOUND COMPLETE COMPARISON:  CT scan 03/14/2017 FINDINGS: Right Kidney: Length: 12.5 cm. Mild fullness right intrarenal collecting system. Mild right hydroureter noted proximally. Echogenicity within normal limits. Left Kidney: Length: 11.2 cm. Echogenicity within normal limits. No mass or hydronephrosis visualized. Bladder: Foley catheter decompresses the urinary bladder. IMPRESSION:  Mild right hydronephrosis with mild proximal right hydroureter. Electronically Signed   By: Misty Stanley M.D.   On: 04/12/2017 19:01   Dg Chest Port 1 View  Result Date: 04/14/2017 CLINICAL DATA:  Acute respiratory failure. EXAM: PORTABLE CHEST 1 VIEW COMPARISON:  04/13/2017 FINDINGS: Endotracheal tube with tip 2.5 cm above the carina. Nasogastric tube courses into the region of the stomach and off the inferior portion of the film as tip is not visualized. Patient is rotated to the left. Lungs are adequately inflated and otherwise clear. Cardiomediastinal silhouette and remainder of the exam is unchanged. IMPRESSION: No acute cardiopulmonary disease. Tubes and lines as described. Electronically Signed   By: Marin Olp M.D.   On: 04/14/2017 09:00   Dg Chest Port 1 View  Result Date: 04/13/2017 CLINICAL DATA:  Intubation. EXAM: PORTABLE CHEST 1 VIEW COMPARISON:  Radiograph yesterday. FINDINGS: Endotracheal tube 2.9 cm from the carina. Enteric tube in place, tip below the diaphragm not included in the field of view. Mild cardiomegaly is unchanged. Low lung volumes. No consolidation, pulmonary edema or pleural fluid. No pneumothorax. IMPRESSION: 1. Endotracheal tube 2.9 cm from the carina. Enteric tube in place, tip below the diaphragm not included in the field of view. 2. Low lung volumes. Electronically Signed   By: Jeb Levering M.D.   On: 04/13/2017 05:14   Dg Chest Port 1 View  Result Date: 04/12/2017 CLINICAL DATA:  Altered mental status EXAM: PORTABLE CHEST 1 VIEW COMPARISON:  03/14/2017 FINDINGS: EKG leads create artifact across the chest. Chronic cardiomegaly. There is no edema, consolidation, effusion, or pneumothorax. IMPRESSION: 1. No evidence of active disease. 2. Chronic cardiomegaly. Electronically Signed   By: Monte Fantasia M.D.   On: 04/12/2017 13:00   Dg Abd Portable 1v  Result Date: 04/13/2017 CLINICAL DATA:  OG tube placement. EXAM: PORTABLE ABDOMEN - 1 VIEW COMPARISON:  CT  abdomen and pelvis 03/14/2017 FINDINGS: The side port of the OG tube  terminates in the fundus the stomach. Heart is enlarged. There is no edema or effusion. Surgical clips are present at the gallbladder fossa. There is a relatively gasless appearance to the abdomen. IMPRESSION: The side port of the OG tube is in the fundus of the stomach Electronically Signed   By: San Morelle M.D.   On: 04/13/2017 14:20    Medications:  Scheduled: . chlorhexidine gluconate (MEDLINE KIT)  15 mL Mouth Rinse BID  . famotidine  20 mg Intravenous Q24H  . heparin  5,000 Units Subcutaneous Q8H  . insulin aspart  0-15 Units Subcutaneous Q4H  . insulin glargine  35 Units Subcutaneous Daily  . mouth rinse  15 mL Mouth Rinse QID   Continuous: . cefTRIAXone (ROCEPHIN)  IV Stopped (04/13/17 2100)  .  sodium bicarbonate (isotonic) infusion in sterile water 75 mL/hr at 04/14/17 0700   JHE:[id number], dextrose, fentaNYL (SUBLIMAZE) injection, fentaNYL (SUBLIMAZE) injection, hydrALAZINE, midazolam, midazolam   LTM EEG 0933 04/13/17 to 0730 04/14/17: This was a moderately abnormal continuous video EEG due to generalized slowing with stimulus-induced GPDs, indicative of a diffuse encephalopathy pattern. This is a non-specific finding and can be seen in toxic-metabolic encephalopathies. The GPDs did not have ictal evolution or an epileptiform appearance.    Assessment: 52 year old female with history of severe multiple sclerosis and associated debilitation, neurogenic bladder with a chronic Foley catheter.  She was admitted with septic shock and gram-negative rod bacteremia, progressed to acute renal failure due to obstructive uropathy, acute respiratory failure requiring mechanical ventilation 1. Multifactorial encephalopathy secondary to septic shock, acute renal failure, respiratory failure, transaminitis and complicated UTI, on a baseline of cognitive dysfunction secondary to advanced multiple sclerosis.  2. LTM  EEG shows no electrographic seizures, but with generalized slowing and stimulus-induced GPDs, indicative of a diffuse encephalopathy pattern. This is a non-specific finding and can be seen in toxic-metabolic encephalopathies.  3. Multiple sclerosis with recent rituximab infusion. The patient may be immunocompromised from recent rituximab infusion required for aggressive MS  4. Acute renal failure. ICU team suspects obstructive uropathy given nonfunctioning chronic foley catheter, which has now been replaced  5. Acute respiratory failure, on mechanical ventilation due to poor airway protection, neurological dysfunction 6. Recurrent urosepsis due to neurogenic bladder and chronic indwelling Foley catheter 7. Other medical problems this admission:        -Anemia of chronic disease and critical illness     -Transaminitis, consider shock liver, etiology unclear    -Septic shock with Gram-negative rod bacteremia, probably Proteus based on BCID    -Complicated UTI   Recommendations: -Continue to treat sepsis/UTI. Evaluate for possible other infectious processes -No neck stiffness on exam. However, as patient is possibly immunocompromised from recent rituximab infusion required for aggressive MS, would have low threshold to perform lumbar puncture if no response to antibiotics. Would also consider covering for opportunistic infections. -ID consult as patient having recurrent admissions for urosepsis  -LTM EEG discontinued -If patient clinically improves in a few days with treatment of infection, then there is no need to repeat an MRI brain -DM. Management as per ICU protocol.   35 minutes spent in the Neurological evaluation and management of this critically ill patient   LOS: 2 days   '@Electronically'$  signed: Dr. Kerney Elbe 04/14/2017  9:41 AM

## 2017-04-14 NOTE — Progress Notes (Signed)
PULMONARY / CRITICAL CARE MEDICINE   Name: Debra [name] MRN: [id number] DOB: [birth year]    ADMISSION DATE:  04/12/2017 CONSULTATION DATE:  04/13/17   CHIEF COMPLAINT:  Altered mental startus  HISTORY OF PRESENT ILLNESS:   The patient was transferred from her NH facility too the ED for altered mental status.The patient is quite obtunded presently. This appears to have started earlier today. She has fairly advanced Multiple sclerosis.The  Patient has a neurogenic bkladder and has been subject to UTIs in the past The patient is not ambulatory without help.She does eat normally as she does niot have a feeding tube The patient has her eyes open. Does not respond to my voice. Does withdraw to noxious stimuli.Has a decreased gag reflex.Will not follow commands.tHERE WAS NO WITNESSED SEIZURE ACTIVITY ANDS THE PATIENT DOES NOT HAVE THAT  History. When the patient arrived in the ED her indwelling foley bag was empty.The catheter was changed and than the patient had a large output of somewhat bloody urine. Her BP was a little "soft" on presentation but came up to the normal ran ge after 2 liters of IV fluid.her creatinine is 5 (was 1+ less than 1 month ago) Her blood sugar is >400. The patient has dry mucosae and poor skin turgor. Her AG was 20-22. Bicarb was decreased to 17.The patient has a history of type 1 DM.o2 sat is 100% on RA.her urien showed many bacteria, WBCS, large Hb, 2+ PROTEINURIA.   Subjective / Interval events:   Hemodynamically improved.  Remains ventilated, poorly responsive    STUDIES:  Continuous EEG 2/15 >> generalized slowing, diffuse encephalopathy no evidence of epileptiform activity or ictal evolution  CULTURES: Tracheal aspirate 2/15 >>  GI panel PCR 2/15 >> negative Blood 2/15 >> GNR >>  BCID 2/15 >> proteus  Urine 2/114 >> proteus, R to Amp, nitrofurantoin  ANTIBIOTICS: Aztreonam x1 2/14 Cefepime 2/14 >> 2/15 Ceftriaxone 2/15 >>   SIGNIFICANT  EVENTS:  LINES/TUBES: Foley changed 2/14 >>  ETT 2/15 >>    VITAL SIGNS: BP (!) 166/96   Pulse 85   Temp 100.1 F (37.8 C) (Axillary)   Resp 17   Ht 5\' 5"  (1.651 m)   Wt 67 kg (147 lb 11.3 oz)   SpO2 100%   BMI 24.58 kg/m       Vent Mode: PRVC FiO2 (%):  [30 %] 30 % Set Rate:  [18 bmp] 18 bmp Vt Set:  [500 mL] 500 mL PEEP:  [5 cmH20] 5 cmH20 Plateau Pressure:  [11 cmH20-18 cmH20] 11 cmH20  INTAKE / OUTPUT: I/O last 3 completed shifts: In: 4225 [I.V.:3885; Other:80; NG/GT:60; IV Piggyback:200] Out: 1090 [Urine:1090]  PHYSICAL EXAMINATION: General: Ill-appearing, mechanically ventilated, sedated Neuro: Grimace with stimulation moves upper extremities HEENT: ET tube in place Cardiovascular: Regular, 3/6 systolic murmur, no edema Lungs: Decreased at both bases, otherwise clear Abdomen: Soft, benign, positive bowel sounds Musculoskeletal: Normal tone Skin: No apparent rash  LABS:  BMET Recent Labs  Lab 04/13/17 0341 04/13/17 1857 04/14/17 0242  NA 141 143 144  K 3.8 3.9 3.5  CL 110 114* 113*  CO2 18* 16* 16*  BUN 73* 79* 79*  CREATININE 5.13* 4.95* 4.18*  GLUCOSE 163* 171* 255*    Electrolytes Recent Labs  Lab 04/13/17 0341 04/13/17 1857 04/14/17 0242  CALCIUM 9.2 8.8* 9.0  MG 2.5*  --  2.6*  PHOS 5.0*  --  5.7*    CBC Recent Labs  Lab 04/12/17 1235 04/13/17 0341 04/14/17 0242  WBC 13.8* 10.4 11.4*  HGB 12.2 10.1* 10.0*  HCT 37.5 31.6* 29.9*  PLT 230 188 160    Coag's Recent Labs  Lab 04/12/17 1519 04/14/17 0242  INR 1.53 1.24    Sepsis Markers Recent Labs  Lab 04/12/17 1534 04/12/17 2047 04/13/17 0341 04/14/17 0242  LATICACIDVEN 2.83*  --  2.8* 0.9  PROCALCITON  --  >150.00  --   --     ABG Recent Labs  Lab 04/13/17 0341 04/13/17 0555  PHART 7.357 7.239*  PCO2ART 27.9* 49.5*  PO2ART 82.0* 496.0*    Liver Enzymes Recent Labs  Lab 04/12/17 1235 04/14/17 0242  AST 93* 37  ALT 86* 50  ALKPHOS 89 100   BILITOT 1.3* 0.6  ALBUMIN 3.0* 2.0*    Cardiac Enzymes No results for input(s): TROPONINI, PROBNP in the last 168 hours.  Glucose Recent Labs  Lab 04/13/17 1227 04/13/17 1605 04/13/17 2109 04/13/17 2356 04/14/17 0340 04/14/17 0932  GLUCAP 152* 158* 187* 228* 236* 190*    Imaging Dg Chest Port 1 View  Result Date: 04/14/2017 CLINICAL DATA:  Acute respiratory failure. EXAM: PORTABLE CHEST 1 VIEW COMPARISON:  04/13/2017 FINDINGS: Endotracheal tube with tip 2.5 cm above the carina. Nasogastric tube courses into the region of the stomach and off the inferior portion of the film as tip is not visualized. Patient is rotated to the left. Lungs are adequately inflated and otherwise clear. Cardiomediastinal silhouette and remainder of the exam is unchanged. IMPRESSION: No acute cardiopulmonary disease. Tubes and lines as described. Electronically Signed   By: Elberta Fortis M.D.   On: 04/14/2017 09:00   Dg Abd Portable 1v  Result Date: 04/13/2017 CLINICAL DATA:  OG tube placement. EXAM: PORTABLE ABDOMEN - 1 VIEW COMPARISON:  CT abdomen and pelvis 03/14/2017 FINDINGS: The side port of the OG tube terminates in the fundus the stomach. Heart is enlarged. There is no edema or effusion. Surgical clips are present at the gallbladder fossa. There is a relatively gasless appearance to the abdomen. IMPRESSION: The side port of the OG tube is in the fundus of the stomach Electronically Signed   By: Marin Roberts M.D.   On: 04/13/2017 14:20    DISCUSSION: 52 year old woman history of severe multiple sclerosis and associated debilitation, neurogenic bladder with a chronic Foley catheter.  Admitted with septic shock and gram-negative rod bacteremia, progressed to acute renal failure requiring mechanical ventilation  ASSESSMENT / PLAN:  PULMONARY A: Acute respiratory failure, mechanical ventilation due to poor airway protection, neurological dysfunction P:   Continue current ventilator support.   Assess daily for possible spontaneous breathing according to mental status VAP prevention orders Follow NIF and vital capacity Question whether she may ultimately require directed treatment for her MS.  Hopefully her decompensation is all related to the acute infection and will improve with treatment of the underlying cause  CARDIOVASCULAR A:  Septic shock, improved Now hypertensive P:  Continue gentle volume resuscitation given her renal failure Norepinephrine weaned to off Antibiotics as below And hydralazine as needed  RENAL A:   Acute renal failure, suspect obstructive uropathy given nonfunctioning chronic Foley catheter, now replaced Neurogenic bladder P:   Foley catheter replaced Follow urine output, BMP.  Improving anticipate a postobstructive diuresis Appreciate nephrology assistance   GASTROINTESTINAL A:   SUP Transaminitis, consider shock liver, etiology unclear.  Improving P:   Continue Pepcid Follow LFT and coags intermittently.  No other workup at this time  HEMATOLOGIC A:   Anemia of chronic disease  and critical illness P:  Follow CBC Transfusion threshold Hgb > 7  INFECTIOUS A:   Septic shock, improved Proteus complicated UTI GNC bacteremia, probably also Proteus Respiratory culture with moderate GPC, moderate GNR P:   Continue ceftriaxone based on Proteus sensitivities Follow respiratory culture, chest x-ray, respiratory status.  No indication to broaden antibiotics at this time based on tracheal aspirate cultures  ENDOCRINE A:   Diabetes mellitus P:   Sliding scale insulin Lantus 35  NEUROLOGIC A:   Severe multiple sclerosis, chronically debilitated.  Acute flare in the setting of infection Toxic metabolic encephalopathy due to sepsis, renal failure P:   RASS goal: -1 to 0 Fentanyl and Versed as needed Follow NIF, FVC ? Whether she may need neuro eval, targeted MS meds if slow to recover   FAMILY  - Updates: none at bedside  2/16  - Inter-disciplinary family meet or Palliative Care meeting due by: 04/19/17   Independent CC time 33 minutes.   Levy Pupa, MD, PhD 04/14/2017, 12:21 PM [facility] [phone] or if no answer [phone]

## 2017-04-14 NOTE — Progress Notes (Signed)
S: No events overnight O:BP (!) 178/108   Pulse 87   Temp 98.1 F (36.7 C) (Oral)   Resp 17   Ht _0  (1.651 m)   Wt 67 kg (147 lb 11.3 oz)   SpO2 100%   BMI 24.58 kg/m   Intake/Output Summary (Last 24 hours) at 04/14/2017 0918 Last data filed at 04/14/2017 0720 Gross per 24 hour  Intake 2513.96 ml  Output 951 ml  Net 1562.96 ml   Intake/Output: I/O last 3 completed shifts: In: 7681 [I.V.:3885; Other:80; NG/GT:60; IV Piggyback:200] Out: 1572 [Urine:1090]  Intake/Output this shift:  Total I/O In: -  Out: 1 [Stool:1] Weight change: -0.2 kg (-7.1 oz) Gen: intubated CVS: no rub Resp: occ rhonchi bilaterally Abd: benign Ext: trace edema  Recent Labs  Lab 04/12/17 1235 04/12/17 2047 04/13/17 0341 04/13/17 1857 04/14/17 0242  NA 139 141 141 143 144  K 5.2* 3.5 3.8 3.9 3.5  CL 102 110 110 114* 113*  CO2 17* 16* 18* 16* 16*  GLUCOSE 400* 329* 163* 171* 255*  BUN 60* 66* 73* 79* 79*  CREATININE 5.61* 5.34* 5.13* 4.95* 4.18*  ALBUMIN 3.0*  --   --   --  2.0*  CALCIUM 10.1 9.4 9.2 8.8* 9.0  PHOS  --   --  5.0*  --  5.7*  AST 93*  --   --   --  37  ALT 86*  --   --   --  50   Liver Function Tests: Recent Labs  Lab 04/12/17 1235 04/14/17 0242  AST 93* 37  ALT 86* 50  ALKPHOS 89 100  BILITOT 1.3* 0.6  PROT 7.0 5.9*  ALBUMIN 3.0* 2.0*   No results for input(s): LIPASE, AMYLASE in the last 168 hours. No results for input(s): AMMONIA in the last 168 hours. CBC: Recent Labs  Lab 04/12/17 1235 04/13/17 0341 04/14/17 0242  WBC 13.8* 10.4 11.4*  NEUTROABS 11.6* 9.1* 10.1*  HGB 12.2 10.1* 10.0*  HCT 37.5 31.6* 29.9*  MCV 91.7 91.6 89.0  PLT 230 188 160   Cardiac Enzymes: Recent Labs  Lab 04/12/17 2047 04/13/17 0430 04/14/17 0242  CKTOTAL 3,191* 2,304* 1,180*   CBG: Recent Labs  Lab 04/13/17 1227 04/13/17 1605 04/13/17 2109 04/13/17 2356 04/14/17 0340  GLUCAP 152* 158* 187* 228* 236*    Iron Studies: No results for input(s): IRON, TIBC,  TRANSFERRIN, FERRITIN in the last 72 hours. Studies/Results: Ct Head Wo Contrast  Result Date: 04/12/2017 CLINICAL DATA:  Altered mental status. History of multiple sclerosis. EXAM: CT HEAD WITHOUT CONTRAST TECHNIQUE: Contiguous axial images were obtained from the base of the skull through the vertex without intravenous contrast. COMPARISON:  MRI brain dated March 14, 2017. CT head dated March 14, 2017. FINDINGS: Brain: No evidence of acute infarction, hemorrhage, hydrocephalus, extra-axial collection or mass lesion/mass effect. Scattered areas of hypodensity in the periventricular deep white matter are not significantly changed when compared to prior study, and are consistent with patient's history of multiple sclerosis. Vascular: Atherosclerotic vascular calcification of the carotid siphons. No hyperdense vessel. Skull: Normal. Negative for fracture or focal lesion. Sinuses/Orbits: Extensive paranasal sinus mucosal thickening. Small air-fluid level in the left sphenoid sinus. Findings are similar to prior study. No acute orbital finding. Other: None. IMPRESSION: 1.  No acute intracranial abnormality. 2. Stable findings related to multiple sclerosis. 3. Chronic paranasal sinus disease, not significantly changed. Electronically Signed   By: Titus Dubin M.D.   On: 04/12/2017 13:07   US  Renal  Result Date: 04/12/2017 CLINICAL DATA:  Bladder outlet obstruction. EXAM: RENAL / URINARY TRACT ULTRASOUND COMPLETE COMPARISON:  CT scan 03/14/2017 FINDINGS: Right Kidney: Length: 12.5 cm. Mild fullness right intrarenal collecting system. Mild right hydroureter noted proximally. Echogenicity within normal limits. Left Kidney: Length: 11.2 cm. Echogenicity within normal limits. No mass or hydronephrosis visualized. Bladder: Foley catheter decompresses the urinary bladder. IMPRESSION: Mild right hydronephrosis with mild proximal right hydroureter. Electronically Signed   By: Misty Stanley M.D.   On: 04/12/2017  19:01   Dg Chest Port 1 View  Result Date: 04/14/2017 CLINICAL DATA:  Acute respiratory failure. EXAM: PORTABLE CHEST 1 VIEW COMPARISON:  04/13/2017 FINDINGS: Endotracheal tube with tip 2.5 cm above the carina. Nasogastric tube courses into the region of the stomach and off the inferior portion of the film as tip is not visualized. Patient is rotated to the left. Lungs are adequately inflated and otherwise clear. Cardiomediastinal silhouette and remainder of the exam is unchanged. IMPRESSION: No acute cardiopulmonary disease. Tubes and lines as described. Electronically Signed   By: Marin Olp M.D.   On: 04/14/2017 09:00   Dg Chest Port 1 View  Result Date: 04/13/2017 CLINICAL DATA:  Intubation. EXAM: PORTABLE CHEST 1 VIEW COMPARISON:  Radiograph yesterday. FINDINGS: Endotracheal tube 2.9 cm from the carina. Enteric tube in place, tip below the diaphragm not included in the field of view. Mild cardiomegaly is unchanged. Low lung volumes. No consolidation, pulmonary edema or pleural fluid. No pneumothorax. IMPRESSION: 1. Endotracheal tube 2.9 cm from the carina. Enteric tube in place, tip below the diaphragm not included in the field of view. 2. Low lung volumes. Electronically Signed   By: Jeb Levering M.D.   On: 04/13/2017 05:14   Dg Chest Port 1 View  Result Date: 04/12/2017 CLINICAL DATA:  Altered mental status EXAM: PORTABLE CHEST 1 VIEW COMPARISON:  03/14/2017 FINDINGS: EKG leads create artifact across the chest. Chronic cardiomegaly. There is no edema, consolidation, effusion, or pneumothorax. IMPRESSION: 1. No evidence of active disease. 2. Chronic cardiomegaly. Electronically Signed   By: Monte Fantasia M.D.   On: 04/12/2017 13:00   Dg Abd Portable 1v  Result Date: 04/13/2017 CLINICAL DATA:  OG tube placement. EXAM: PORTABLE ABDOMEN - 1 VIEW COMPARISON:  CT abdomen and pelvis 03/14/2017 FINDINGS: The side port of the OG tube terminates in the fundus the stomach. Heart is enlarged.  There is no edema or effusion. Surgical clips are present at the gallbladder fossa. There is a relatively gasless appearance to the abdomen. IMPRESSION: The side port of the OG tube is in the fundus of the stomach Electronically Signed   By: San Morelle M.D.   On: 04/13/2017 14:20   . chlorhexidine gluconate (MEDLINE KIT)  15 mL Mouth Rinse BID  . famotidine  20 mg Intravenous Q24H  . heparin  5,000 Units Subcutaneous Q8H  . insulin aspart  0-15 Units Subcutaneous Q4H  . insulin glargine  35 Units Subcutaneous Daily  . mouth rinse  15 mL Mouth Rinse QID    BMET    Component Value Date/Time   NA 144 04/14/2017 0242   NA 135 04/21/2015 0956   K 3.5 04/14/2017 0242   CL 113 (H) 04/14/2017 0242   CO2 16 (L) 04/14/2017 0242   GLUCOSE 255 (H) 04/14/2017 0242   BUN 79 (H) 04/14/2017 0242   BUN 11 04/21/2015 0956   CREATININE 4.18 (H) 04/14/2017 0242   CREATININE 0.79 08/28/2014 1223   CALCIUM 9.0 04/14/2017  0242   GFRNONAA 11 (L) 04/14/2017 0242   GFRNONAA 89 08/28/2014 1223   GFRAA 13 (L) 04/14/2017 0242   GFRAA >89 08/28/2014 1223   CBC    Component Value Date/Time   WBC 11.4 (H) 04/14/2017 0242   RBC 3.36 (L) 04/14/2017 0242   HGB 10.0 (L) 04/14/2017 0242   HCT 29.9 (L) 04/14/2017 0242   PLT 160 04/14/2017 0242   MCV 89.0 04/14/2017 0242   MCH 29.8 04/14/2017 0242   MCHC 33.4 04/14/2017 0242   RDW 15.5 04/14/2017 0242   LYMPHSABS 0.6 (L) 04/14/2017 0242   MONOABS 0.6 04/14/2017 0242   EOSABS 0.1 04/14/2017 0242   BASOSABS 0.0 04/14/2017 0242     Assessment/Plan: 1.  AKI/CKD stage 3- multifactorial:  Obstructive uropathy from clogged chronic indwelling foley, ischemic ATN in setting of severe sepsis requiring pressors with concomitant ACE inhibition, and mild rhabdomyolysis.  She has been on pressors and receiving IVF's with improvement of BUN/Cr.  Her UOP has also improved after replacement of clogged foley catheter.  No indications for CRRT at this time as her  renal function is improving.  We will continue to follow closely.  Continue with IVF's and pressor support as needed. 2. Hyperkalemia due to #1- resolved 3. Metabolic acidosis - due to #1:  Improving but may need bicarb.  Continue to follow. 4. VDRF- per PCCM 5. Septic shock from GNR bacteremia- on  6. Urosepsis- GNR bacteremia and UTI- presumably proteus, on aztreonam and cetriaxone (off of maxipime) per PCCM 7. DM type 1- per primary 8. Neurogenic bladder- new foley in place 9. Abnormal LFT's- possible shock liver 10. Anemia of CKD stage 3 and critical illness   Donetta Potts, MD [facility] [phone]

## 2017-04-15 ENCOUNTER — Encounter (HOSPITAL_COMMUNITY): Payer: Self-pay | Admitting: *Deleted

## 2017-04-15 ENCOUNTER — Other Ambulatory Visit: Payer: Self-pay

## 2017-04-15 ENCOUNTER — Inpatient Hospital Stay (HOSPITAL_COMMUNITY): Payer: Medicaid Other

## 2017-04-15 DIAGNOSIS — G35 Multiple sclerosis: Secondary | ICD-10-CM

## 2017-04-15 LAB — BASIC METABOLIC PANEL
ANION GAP: 17 — AB (ref 5–15)
BUN: 66 mg/dL — ABNORMAL HIGH (ref 6–20)
CHLORIDE: 110 mmol/L (ref 101–111)
CO2: 22 mmol/L (ref 22–32)
Calcium: 8.9 mg/dL (ref 8.9–10.3)
Creatinine, Ser: 2.5 mg/dL — ABNORMAL HIGH (ref 0.44–1.00)
GFR calc Af Amer: 25 mL/min — ABNORMAL LOW (ref >60)
GFR, EST NON AFRICAN AMERICAN: 21 mL/min — AB (ref >60)
GLUCOSE: 93 mg/dL (ref 65–99)
POTASSIUM: 3 mmol/L — AB (ref 3.5–5.1)
Sodium: 149 mmol/L — ABNORMAL HIGH (ref 135–145)

## 2017-04-15 LAB — CBC WITH DIFFERENTIAL/PLATELET
BASOS ABS: 0 10*3/uL (ref 0.0–0.1)
Basophils Relative: 0 %
Eosinophils Absolute: 0 10*3/uL (ref 0.0–0.7)
Eosinophils Relative: 0 %
HEMATOCRIT: 32.1 % — AB (ref 36.0–46.0)
Hemoglobin: 10.7 g/dL — ABNORMAL LOW (ref 12.0–15.0)
LYMPHS PCT: 9 %
Lymphs Abs: 1.1 10*3/uL (ref 0.7–4.0)
MCH: 29.3 pg (ref 26.0–34.0)
MCHC: 33.3 g/dL (ref 30.0–36.0)
MCV: 87.9 fL (ref 78.0–100.0)
MONO ABS: 0.4 10*3/uL (ref 0.1–1.0)
Monocytes Relative: 3 %
NEUTROS ABS: 10.7 10*3/uL — AB (ref 1.7–7.7)
Neutrophils Relative %: 88 %
Platelets: 143 10*3/uL — ABNORMAL LOW (ref 150–400)
RBC: 3.65 MIL/uL — ABNORMAL LOW (ref 3.87–5.11)
RDW: 15.2 % (ref 11.5–15.5)
WBC: 12.3 10*3/uL — ABNORMAL HIGH (ref 4.0–10.5)

## 2017-04-15 LAB — GLUCOSE, CAPILLARY
GLUCOSE-CAPILLARY: 80 mg/dL (ref 65–99)
GLUCOSE-CAPILLARY: 95 mg/dL (ref 65–99)
GLUCOSE-CAPILLARY: 95 mg/dL (ref 65–99)
Glucose-Capillary: 143 mg/dL — ABNORMAL HIGH (ref 65–99)
Glucose-Capillary: 179 mg/dL — ABNORMAL HIGH (ref 65–99)
Glucose-Capillary: 191 mg/dL — ABNORMAL HIGH (ref 65–99)
Glucose-Capillary: 80 mg/dL (ref 65–99)

## 2017-04-15 LAB — URINE CULTURE: Culture: >=100000 — AB

## 2017-04-15 LAB — CULTURE, BLOOD (ROUTINE X 2)
SPECIAL REQUESTS: ADEQUATE
Special Requests: ADEQUATE

## 2017-04-15 LAB — CULTURE, RESPIRATORY

## 2017-04-15 LAB — MAGNESIUM: Magnesium: 2.3 mg/dL (ref 1.7–2.4)

## 2017-04-15 LAB — CULTURE, RESPIRATORY W GRAM STAIN: Culture: NORMAL

## 2017-04-15 LAB — PHOSPHORUS: Phosphorus: 3.7 mg/dL (ref 2.5–4.6)

## 2017-04-15 LAB — CK: CK TOTAL: 930 U/L — AB (ref 38–234)

## 2017-04-15 MED ORDER — LINEZOLID 600 MG/300ML IV SOLN
600.0000 mg | Freq: Two times a day (BID) | INTRAVENOUS | Status: DC
  Administered 2017-04-15 – 2017-04-18 (×8): 600 mg via INTRAVENOUS
  Filled 2017-04-15 (×9): qty 300

## 2017-04-15 MED ORDER — PNEUMOCOCCAL VAC POLYVALENT 25 MCG/0.5ML IJ INJ
0.5000 mL | INJECTION | INTRAMUSCULAR | Status: DC
  Filled 2017-04-15 (×2): qty 0.5

## 2017-04-15 MED ORDER — POTASSIUM CHLORIDE 20 MEQ PO PACK
40.0000 meq | PACK | ORAL | Status: AC
  Administered 2017-04-15 (×2): 40 meq
  Filled 2017-04-15 (×2): qty 2

## 2017-04-15 MED ORDER — VITAL AF 1.2 CAL PO LIQD
1000.0000 mL | ORAL | Status: DC
  Administered 2017-04-15 – 2017-04-16 (×2): 1000 mL

## 2017-04-15 MED ORDER — FREE WATER
300.0000 mL | Freq: Four times a day (QID) | Status: DC
  Administered 2017-04-15 – 2017-04-16 (×5): 300 mL

## 2017-04-15 NOTE — Progress Notes (Signed)
Pharmacy Antibiotic Note  Debra [name] is a 52 y.o. female with UTI  Blood cultures - proteus x 2 Urine - proteus and VRE  Patient with documented hx of anaphylaxis to [facility]  Plan: Continue Ceftriaxone for proteus Add zyvox 600 q12h for VRE since pt has allergy to penicillins  Isaac Bliss, PharmD, BCPS, BCCCP Clinical Pharmacist Clinical phone for 04/15/2017 from 7a-3:30p: [phone] If after 3:30p, please call main pharmacy at: [phone] 04/15/2017 11:04 AM

## 2017-04-15 NOTE — Progress Notes (Signed)
Transported pt to CT scan on vent with full support and 100% FIO2.  Tolerated well. Placed back on wean once settled back in room.

## 2017-04-15 NOTE — Progress Notes (Signed)
PULMONARY / CRITICAL CARE MEDICINE   Name: Debra [name] MRN: [id number] DOB: 12-03-1965    ADMISSION DATE:  04/12/2017 CONSULTATION DATE:  04/13/17   CHIEF COMPLAINT:  Altered mental startus  HISTORY OF PRESENT ILLNESS:   The patient was transferred from her NH facility too the ED for altered mental status.The patient is quite obtunded presently. This appears to have started earlier today. She has fairly advanced Multiple sclerosis.The  Patient has a neurogenic bkladder and has been subject to UTIs in the past The patient is not ambulatory without help.She does eat normally as she does niot have a feeding tube The patient has her eyes open. Does not respond to my voice. Does withdraw to noxious stimuli.Has a decreased gag reflex.Will not follow commands.tHERE WAS NO WITNESSED SEIZURE ACTIVITY ANDS THE PATIENT DOES NOT HAVE THAT  History. When the patient arrived in the ED her indwelling foley bag was empty.The catheter was changed and than the patient had a large output of somewhat bloody urine. Her BP was a little "soft" on presentation but came up to the normal ran ge after 2 liters of IV fluid.her creatinine is 5 (was 1+ less than 1 month ago) Her blood sugar is >400. The patient has dry mucosae and poor skin turgor. Her AG was 20-22. Bicarb was decreased to 17.The patient has a history of type 1 DM.o2 sat is 100% on RA.her urien showed many bacteria, WBCS, large Hb, 2+ PROTEINURIA.   Subjective / Interval events:   Less responsive today Tolerating pressure support   STUDIES:  Continuous EEG 2/15 >> generalized slowing, diffuse encephalopathy no evidence of epileptiform activity or ictal evolution  CULTURES: Tracheal aspirate 2/15 >>  GI panel PCR 2/15 >> negative Blood 2/15 >> GNR >> Proteus BCID 2/15 >> proteus  Urine 2/114 >> proteus, R to Amp, nitrofurantoin; VRE  ANTIBIOTICS: Aztreonam x1 2/14 Cefepime 2/14 >> 2/15 Ceftriaxone 2/15 >>   SIGNIFICANT  EVENTS:  LINES/TUBES: Foley changed 2/14 >>  ETT 2/15 >>    VITAL SIGNS: BP (!) 150/84   Pulse 90   Temp 99.6 F (37.6 C) (Axillary)   Resp (!) 0   Ht 5\' 5"  (1.651 m)   Wt 66.9 kg (147 lb 7.8 oz)   SpO2 100%   BMI 24.54 kg/m       Vent Mode: PSV;CPAP FiO2 (%):  [30 %] 30 % Set Rate:  [18 bmp] 18 bmp Vt Set:  [500 mL] 500 mL PEEP:  [5 cmH20] 5 cmH20 Pressure Support:  [8 cmH20] 8 cmH20 Plateau Pressure:  [11 cmH20-20 cmH20] 20 cmH20  INTAKE / OUTPUT: I/O last 3 completed shifts: In: 3426.1 [I.V.:2866.1; Other:100; NG/GT:60; IV Piggyback:400] Out: 3331 [Urine:2980; Emesis/NG output:350; Stool:1]  PHYSICAL EXAMINATION: General: Ill-appearing, mechanically ventilated, sedated Neuro: Grimace with stimulation moves upper extremities HEENT: ET tube in place Cardiovascular: Regular, 3/6 systolic murmur, no edema Lungs: Decreased at both bases, otherwise clear Abdomen: Soft, benign, positive bowel sounds Musculoskeletal: Normal tone Skin: No apparent rash  LABS:  BMET Recent Labs  Lab 04/13/17 1857 04/14/17 0242 04/15/17 0431  NA 143 144 149*  K 3.9 3.5 3.0*  CL 114* 113* 110  CO2 16* 16* 22  BUN 79* 79* 66*  CREATININE 4.95* 4.18* 2.50*  GLUCOSE 171* 255* 93    Electrolytes Recent Labs  Lab 04/13/17 0341 04/13/17 1857 04/14/17 0242 04/15/17 0431  CALCIUM 9.2 8.8* 9.0 8.9  MG 2.5*  --  2.6* 2.3  PHOS 5.0*  --  5.7*  3.7    CBC Recent Labs  Lab 04/13/17 0341 04/14/17 0242 04/15/17 0431  WBC 10.4 11.4* 12.3*  HGB 10.1* 10.0* 10.7*  HCT 31.6* 29.9* 32.1*  PLT 188 160 143*    Coag's Recent Labs  Lab 04/12/17 1519 04/14/17 0242  INR 1.53 1.24    Sepsis Markers Recent Labs  Lab 04/12/17 1534 04/12/17 2047 04/13/17 0341 04/14/17 0242  LATICACIDVEN 2.83*  --  2.8* 0.9  PROCALCITON  --  >150.00  --   --     ABG Recent Labs  Lab 04/13/17 0341 04/13/17 0555  PHART 7.357 7.239*  PCO2ART 27.9* 49.5*  PO2ART 82.0* 496.0*     Liver Enzymes Recent Labs  Lab 04/12/17 1235 04/14/17 0242  AST 93* 37  ALT 86* 50  ALKPHOS 89 100  BILITOT 1.3* 0.6  ALBUMIN 3.0* 2.0*    Cardiac Enzymes No results for input(s): TROPONINI, PROBNP in the last 168 hours.  Glucose Recent Labs  Lab 04/14/17 1225 04/14/17 1603 04/14/17 2016 04/15/17 0027 04/15/17 0345 04/15/17 0858  GLUCAP 170* 171* 121* 80 80 95    Imaging Ct Head Wo Contrast  Result Date: 04/15/2017 CLINICAL DATA:  Altered mental status this morning.  History of MS EXAM: CT HEAD WITHOUT CONTRAST TECHNIQUE: Contiguous axial images were obtained from the base of the skull through the vertex without intravenous contrast. COMPARISON:  04/12/2017 and 03/14/2017, MRI 03/14/2017 FINDINGS: Brain: Ventricles, cisterns and other CSF spaces are within normal. There is no mass, mass effect, shift midline structures or acute hemorrhage. No evidence of acute infarction. There is minimal focal low-attenuation over the subcortical white matter of the frontal lobes unchanged and likely due to known demyelinating disease. Vascular: No hyperdense vessel or unexpected calcification. Skull: Normal. Negative for fracture or focal lesion. Sinuses/Orbits: Orbits are within normal. Opacification of the left frontal and bilateral ethmoid sinus compatible with mild chronic inflammatory disease. Other: None. IMPRESSION: No acute findings. Mild bifrontal subcortical white matter changes which are stable compatible with patient's known MS. Mild chronic sinus inflammatory disease. Electronically Signed   By: Elberta Fortis M.D.   On: 04/15/2017 10:54   Dg Chest Port 1 View  Result Date: 04/15/2017 CLINICAL DATA:  Acute respiratory failure. EXAM: PORTABLE CHEST 1 VIEW COMPARISON:  04/14/2017 FINDINGS: Endotracheal tube has tip 3 cm above the carina. Nasogastric tube courses into the region of the stomach and off the inferior portion of the film. Lungs are adequately inflated and otherwise  clear. Cardiomediastinal silhouette and remainder of the exam is unchanged. IMPRESSION: No active disease. Tubes and lines unchanged. Electronically Signed   By: Elberta Fortis M.D.   On: 04/15/2017 08:49    DISCUSSION: 52 year old woman history of severe multiple sclerosis and associated debilitation, neurogenic bladder with a chronic Foley catheter.  Admitted with septic shock, Proteus bacteremia, Proteus plus VRE UTI, progressed to acute renal failure requiring mechanical ventilation  ASSESSMENT / PLAN:  PULMONARY A: Acute respiratory failure, mechanical ventilation due to poor airway protection, neurological dysfunction P:   Continue current ventilator support.  She tolerate spontaneous breathing but does not have the mental status for extubation.  This will be the limiting factor VAP prevention orders We will start following NIF and vital capacity when she can participate.  Clearly her neuromuscular weakness will also influence ability to extubate Question whether she may ultimately require further treatment for her MS.  Hopefully this decompensation is all related to the acute infectious process  CARDIOVASCULAR A:  Septic shock, improved  Now hypertensive P:  Pressors now weaned to off Antibiotics adjusted as below Hydralazine if needed  RENAL A:   Acute renal failure, suspect obstructive uropathy given nonfunctioning chronic Foley catheter, now replaced; improving Hypernatremia Hypokalemia Neurogenic bladder P:   Foley catheter replaced Replace potassium Add free water Follow urine output, BMP Appreciate nephrology assistance   GASTROINTESTINAL A:   SUP Transaminitis, consider shock liver, etiology unclear.  Improving P:   Continue Pepcid Follow LFT and coags intermittently Start tube feeding 2/17  HEMATOLOGIC A:   Anemia of chronic disease and critical illness P:  Follow CBC Transfusion threshold hemoglobin > 7  INFECTIOUS A:   Septic shock,  improved Proteus + VRE complicated UTI Proteus bacteremia Respiratory culture with moderate GPC, moderate GNR; pending P:   Continue ceftriaxone based on Proteus sensitivities Add ampicillin to cover the VRE in urine Follow respiratory culture, chest x-ray, respiratory status.   ENDOCRINE A:   Diabetes mellitus P:   Sliding scale insulin Lantus 35  NEUROLOGIC A:   Severe multiple sclerosis, chronically debilitated.  Acute flare in the setting of infection Toxic metabolic encephalopathy due to sepsis, renal failure; acutely worse 2/17, not interacting at all P:   RASS goal: 0 Fentanyl and Versed as needed > has not required Follow NIF, FVC when she is able to participate Head CT now to rule out acute change given her suppressed mental status 2/17 ? Whether she may need neuro eval, targeted MS meds if slow to recover   FAMILY  - Updates: none at bedside 2/16 or 2/17  - Inter-disciplinary family meet or Palliative Care meeting due by: 04/19/17   Independent CC time 32 minutes.   Levy Pupa, MD, PhD 04/15/2017, 11:07 AM [facility] [phone] or if no answer [phone]

## 2017-04-15 NOTE — Progress Notes (Signed)
eLink Physician-Brief Progress Note Patient Name: Debra [name] DOB: [birth year] MRN: [id number]   Date of Service  04/15/2017  HPI/Events of Note  Watery stools, nurse concerned about C. Difficile colitis.   eICU Interventions  Will order: 1. C. Difficile PCR panel now. 2. Enteric precautions.      Intervention Category Major Interventions: Other:  Lenell Antu 04/15/2017, 9:32 PM

## 2017-04-15 NOTE — Progress Notes (Signed)
S: unresponsive this morning O:BP (!) 160/85   Pulse 94   Temp 99 F (37.2 C) (Axillary)   Resp 19   Ht _0  (1.651 m)   Wt 66.9 kg (147 lb 7.8 oz)   SpO2 100%   BMI 24.54 kg/m   Intake/Output Summary (Last 24 hours) at 04/15/2017 1312 Last data filed at 04/15/2017 1200 Gross per 24 hour  Intake 2090.25 ml  Output 2980 ml  Net -889.75 ml   Intake/Output: I/O last 3 completed shifts: In: 3426.1 [I.V.:2866.1; Other:100; NG/GT:60; IV Piggyback:400] Out: 3331 [Urine:2980; Emesis/NG output:350; Stool:1]  Intake/Output this shift:  Total I/O In: 365.3 [I.V.:365.3] Out: 900 [Urine:900] Weight change: -0.1 kg (-3.5 oz) Gen: intubated CVS: no rub, III/VI SEM at LUSB Resp: cta Abd:+BS, soft, NT Ext: no edema  Recent Labs  Lab 04/12/17 1235 04/12/17 2047 04/13/17 0341 04/13/17 1857 04/14/17 0242 04/15/17 0431  NA 139 141 141 143 144 149*  K 5.2* 3.5 3.8 3.9 3.5 3.0*  CL 102 110 110 114* 113* 110  CO2 17* 16* 18* 16* 16* 22  GLUCOSE 400* 329* 163* 171* 255* 93  BUN 60* 66* 73* 79* 79* 66*  CREATININE 5.61* 5.34* 5.13* 4.95* 4.18* 2.50*  ALBUMIN 3.0*  --   --   --  2.0*  --   CALCIUM 10.1 9.4 9.2 8.8* 9.0 8.9  PHOS  --   --  5.0*  --  5.7* 3.7  AST 93*  --   --   --  37  --   ALT 86*  --   --   --  50  --    Liver Function Tests: Recent Labs  Lab 04/12/17 1235 04/14/17 0242  AST 93* 37  ALT 86* 50  ALKPHOS 89 100  BILITOT 1.3* 0.6  PROT 7.0 5.9*  ALBUMIN 3.0* 2.0*   No results for input(s): LIPASE, AMYLASE in the last 168 hours. No results for input(s): AMMONIA in the last 168 hours. CBC: Recent Labs  Lab 04/12/17 1235 04/13/17 0341 04/14/17 0242 04/15/17 0431  WBC 13.8* 10.4 11.4* 12.3*  NEUTROABS 11.6* 9.1* 10.1* 10.7*  HGB 12.2 10.1* 10.0* 10.7*  HCT 37.5 31.6* 29.9* 32.1*  MCV 91.7 91.6 89.0 87.9  PLT 230 188 160 143*   Cardiac Enzymes: Recent Labs  Lab 04/12/17 2047 04/13/17 0430 04/14/17 0242 04/15/17 0431  CKTOTAL 3,191* 2,304* 1,180*  930*   CBG: Recent Labs  Lab 04/14/17 2016 04/15/17 0027 04/15/17 0345 04/15/17 0858 04/15/17 1229  GLUCAP 121* 80 80 95 95    Iron Studies: No results for input(s): IRON, TIBC, TRANSFERRIN, FERRITIN in the last 72 hours. Studies/Results: Ct Head Wo Contrast  Result Date: 04/15/2017 CLINICAL DATA:  Altered mental status this morning.  History of MS EXAM: CT HEAD WITHOUT CONTRAST TECHNIQUE: Contiguous axial images were obtained from the base of the skull through the vertex without intravenous contrast. COMPARISON:  04/12/2017 and 03/14/2017, MRI 03/14/2017 FINDINGS: Brain: Ventricles, cisterns and other CSF spaces are within normal. There is no mass, mass effect, shift midline structures or acute hemorrhage. No evidence of acute infarction. There is minimal focal low-attenuation over the subcortical white matter of the frontal lobes unchanged and likely due to known demyelinating disease. Vascular: No hyperdense vessel or unexpected calcification. Skull: Normal. Negative for fracture or focal lesion. Sinuses/Orbits: Orbits are within normal. Opacification of the left frontal and bilateral ethmoid sinus compatible with mild chronic inflammatory disease. Other: None. IMPRESSION: No acute findings. Mild bifrontal subcortical white  matter changes which are stable compatible with patient's known MS. Mild chronic sinus inflammatory disease. Electronically Signed   By: Marin Olp M.D.   On: 04/15/2017 10:54   Dg Chest Port 1 View  Result Date: 04/15/2017 CLINICAL DATA:  Acute respiratory failure. EXAM: PORTABLE CHEST 1 VIEW COMPARISON:  04/14/2017 FINDINGS: Endotracheal tube has tip 3 cm above the carina. Nasogastric tube courses into the region of the stomach and off the inferior portion of the film. Lungs are adequately inflated and otherwise clear. Cardiomediastinal silhouette and remainder of the exam is unchanged. IMPRESSION: No active disease. Tubes and lines unchanged. Electronically Signed    By: Marin Olp M.D.   On: 04/15/2017 08:49   Dg Chest Port 1 View  Result Date: 04/14/2017 CLINICAL DATA:  Acute respiratory failure. EXAM: PORTABLE CHEST 1 VIEW COMPARISON:  04/13/2017 FINDINGS: Endotracheal tube with tip 2.5 cm above the carina. Nasogastric tube courses into the region of the stomach and off the inferior portion of the film as tip is not visualized. Patient is rotated to the left. Lungs are adequately inflated and otherwise clear. Cardiomediastinal silhouette and remainder of the exam is unchanged. IMPRESSION: No acute cardiopulmonary disease. Tubes and lines as described. Electronically Signed   By: Marin Olp M.D.   On: 04/14/2017 09:00   Dg Abd Portable 1v  Result Date: 04/13/2017 CLINICAL DATA:  OG tube placement. EXAM: PORTABLE ABDOMEN - 1 VIEW COMPARISON:  CT abdomen and pelvis 03/14/2017 FINDINGS: The side port of the OG tube terminates in the fundus the stomach. Heart is enlarged. There is no edema or effusion. Surgical clips are present at the gallbladder fossa. There is a relatively gasless appearance to the abdomen. IMPRESSION: The side port of the OG tube is in the fundus of the stomach Electronically Signed   By: San Morelle M.D.   On: 04/13/2017 14:20   . chlorhexidine gluconate (MEDLINE KIT)  15 mL Mouth Rinse BID  . famotidine  20 mg Intravenous Q24H  . free water  300 mL Per Tube Q6H  . heparin  5,000 Units Subcutaneous Q8H  . insulin aspart  0-15 Units Subcutaneous Q4H  . insulin glargine  35 Units Subcutaneous Daily  . mouth rinse  15 mL Mouth Rinse QID  . [START ON 04/16/2017] pneumococcal 23 valent vaccine  0.5 mL Intramuscular Tomorrow-1000  . potassium chloride  40 mEq Per Tube Q4H    BMET    Component Value Date/Time   NA 149 (H) 04/15/2017 0431   NA 135 04/21/2015 0956   K 3.0 (L) 04/15/2017 0431   CL 110 04/15/2017 0431   CO2 22 04/15/2017 0431   GLUCOSE 93 04/15/2017 0431   BUN 66 (H) 04/15/2017 0431   BUN 11 04/21/2015 0956    CREATININE 2.50 (H) 04/15/2017 0431   CREATININE 0.79 08/28/2014 1223   CALCIUM 8.9 04/15/2017 0431   GFRNONAA 21 (L) 04/15/2017 0431   GFRNONAA 89 08/28/2014 1223   GFRAA 25 (L) 04/15/2017 0431   GFRAA >89 08/28/2014 1223   CBC    Component Value Date/Time   WBC 12.3 (H) 04/15/2017 0431   RBC 3.65 (L) 04/15/2017 0431   HGB 10.7 (L) 04/15/2017 0431   HCT 32.1 (L) 04/15/2017 0431   PLT 143 (L) 04/15/2017 0431   MCV 87.9 04/15/2017 0431   MCH 29.3 04/15/2017 0431   MCHC 33.3 04/15/2017 0431   RDW 15.2 04/15/2017 0431   LYMPHSABS 1.1 04/15/2017 0431   MONOABS 0.4 04/15/2017 0431  EOSABS 0.0 04/15/2017 0431   BASOSABS 0.0 04/15/2017 0431     Assessment/Plan: 1. AKI/CKD stage 3- multifactorial: Obstructive uropathy from clogged chronic indwelling foley, ischemic ATN in setting of severe sepsis requiring pressors with concomitant ACE inhibition, and mild rhabdomyolysis. She has been on pressors and receiving IVF's with improvement of BUN/Cr. Her UOP has also improved after replacement of clogged foley catheter. No indications for CRRT at this time as her renal function is improving.  We will continue to follow closely. Continue with IVF's and pressor support as needed. 2. Hyperkalemia due to #1- resolved 3. Metabolic acidosis - due to #1: Improving but may need bicarb. Continue to follow. 4. VDRF- per PCCM 5. Septic shock from Proteus bacteremia- off pressors and cont with abx per PCCM 6. DM type 1- per primary 7. Neurogenic bladder- new foley in place 8. Abnormal LFT's- possible shock liver 9. Anemia of CKD stage 3 and critical illness   Donetta Potts, MD [facility] [phone]

## 2017-04-15 NOTE — Progress Notes (Signed)
eLink Physician-Brief Progress Note Patient Name: Debra [name] DOB: October 29, 1965 MRN: [id number]   Date of Service  04/15/2017  HPI/Events of Note  Watery stools - Request for Flexiseal   eICU Interventions  Will order Flexiseal.      Intervention Category Major Interventions: Other:  Lenell Antu 04/15/2017, 8:52 PM

## 2017-04-16 ENCOUNTER — Inpatient Hospital Stay (HOSPITAL_COMMUNITY): Payer: Medicaid Other

## 2017-04-16 DIAGNOSIS — G9340 Encephalopathy, unspecified: Secondary | ICD-10-CM

## 2017-04-16 LAB — CBC
HCT: 32.4 % — ABNORMAL LOW (ref 36.0–46.0)
Hemoglobin: 10.4 g/dL — ABNORMAL LOW (ref 12.0–15.0)
MCH: 28.8 pg (ref 26.0–34.0)
MCHC: 32.1 g/dL (ref 30.0–36.0)
MCV: 89.8 fL (ref 78.0–100.0)
PLATELETS: 147 10*3/uL — AB (ref 150–400)
RBC: 3.61 MIL/uL — ABNORMAL LOW (ref 3.87–5.11)
RDW: 15.3 % (ref 11.5–15.5)
WBC: 9.8 10*3/uL (ref 4.0–10.5)

## 2017-04-16 LAB — BASIC METABOLIC PANEL
ANION GAP: 13 (ref 5–15)
BUN: 65 mg/dL — ABNORMAL HIGH (ref 6–20)
CHLORIDE: 112 mmol/L — AB (ref 101–111)
CO2: 25 mmol/L (ref 22–32)
Calcium: 8.7 mg/dL — ABNORMAL LOW (ref 8.9–10.3)
Creatinine, Ser: 2.06 mg/dL — ABNORMAL HIGH (ref 0.44–1.00)
GFR, EST AFRICAN AMERICAN: 31 mL/min — AB (ref >60)
GFR, EST NON AFRICAN AMERICAN: 27 mL/min — AB (ref >60)
Glucose, Bld: 193 mg/dL — ABNORMAL HIGH (ref 65–99)
POTASSIUM: 3 mmol/L — AB (ref 3.5–5.1)
SODIUM: 150 mmol/L — AB (ref 135–145)

## 2017-04-16 LAB — AMMONIA: AMMONIA: 18 umol/L (ref 9–35)

## 2017-04-16 LAB — GLUCOSE, CAPILLARY
GLUCOSE-CAPILLARY: 174 mg/dL — AB (ref 65–99)
GLUCOSE-CAPILLARY: 226 mg/dL — AB (ref 65–99)
GLUCOSE-CAPILLARY: 247 mg/dL — AB (ref 65–99)
Glucose-Capillary: 177 mg/dL — ABNORMAL HIGH (ref 65–99)
Glucose-Capillary: 216 mg/dL — ABNORMAL HIGH (ref 65–99)
Glucose-Capillary: 220 mg/dL — ABNORMAL HIGH (ref 65–99)

## 2017-04-16 LAB — C DIFFICILE QUICK SCREEN W PCR REFLEX
C DIFFICLE (CDIFF) ANTIGEN: POSITIVE — AB
C Diff toxin: NEGATIVE

## 2017-04-16 LAB — CLOSTRIDIUM DIFFICILE BY PCR, REFLEXED: Toxigenic C. Difficile by PCR: POSITIVE — AB

## 2017-04-16 LAB — MAGNESIUM: MAGNESIUM: 2.4 mg/dL (ref 1.7–2.4)

## 2017-04-16 MED ORDER — FAMOTIDINE 40 MG/5ML PO SUSR
20.0000 mg | Freq: Every day | ORAL | Status: DC
  Administered 2017-04-16 – 2017-04-17 (×2): 20 mg
  Filled 2017-04-16 (×2): qty 2.5

## 2017-04-16 MED ORDER — FREE WATER
400.0000 mL | Status: DC
  Administered 2017-04-16 – 2017-04-17 (×5): 400 mL

## 2017-04-16 MED ORDER — POTASSIUM CHLORIDE 10 MEQ/100ML IV SOLN: 10.0000 meq | INTRAVENOUS | Status: DC

## 2017-04-16 MED ORDER — POTASSIUM CHLORIDE 10 MEQ/100ML IV SOLN
10.0000 meq | INTRAVENOUS | Status: AC
  Administered 2017-04-16 (×4): 10 meq via INTRAVENOUS
  Filled 2017-04-16 (×4): qty 100

## 2017-04-16 NOTE — Progress Notes (Signed)
Admit: 04/12/2017 LOS: 4  89M severe MS SNF resident with AKI on CKD3 (BL SCr 1.2-1.5) from obstructed foley, likey sepsis related ATN  Subjective:  Excellent UOP Downtrending SCR and BUN Na midly elevated at 150 on FWF 300 q6h Watery stools overnight   02/17 0701 - 02/18 0700 In: 3309 [I.V.:905.3; NG/GT:1603.8; IV Piggyback:800] Out: 2650 [Urine:2650]  Filed Weights   04/14/17 0414 04/15/17 0346 04/16/17 0500  Weight: 67 kg (147 lb 11.3 oz) 66.9 kg (147 lb 7.8 oz) 70.1 kg (154 lb 8.7 oz)    Scheduled Meds: . chlorhexidine gluconate (MEDLINE KIT)  15 mL Mouth Rinse BID  . famotidine  20 mg Intravenous Q24H  . free water  300 mL Per Tube Q6H  . heparin  5,000 Units Subcutaneous Q8H  . insulin aspart  0-15 Units Subcutaneous Q4H  . insulin glargine  35 Units Subcutaneous Daily  . mouth rinse  15 mL Mouth Rinse QID  . pneumococcal 23 valent vaccine  0.5 mL Intramuscular Tomorrow-1000   Continuous Infusions: . cefTRIAXone (ROCEPHIN)  IV Stopped (04/15/17 2125)  . feeding supplement (VITAL AF 1.2 CAL) 1,000 mL (04/16/17 0700)  . linezolid (ZYVOX) IV Stopped (04/15/17 2200)  . potassium chloride 10 mEq (04/16/17 0849)  .  sodium bicarbonate (isotonic) infusion in sterile water 30 mL/hr at 04/16/17 0700   PRN Meds:.acetaminophen, dextrose, fentaNYL (SUBLIMAZE) injection, fentaNYL (SUBLIMAZE) injection, hydrALAZINE, midazolam, midazolam  Current Labs: reviewed    Physical Exam:  Blood pressure (!) 172/98, pulse 91, temperature 99.9 F (37.7 C), temperature source Oral, resp. rate (!) 29, height 5' 5" (1.651 m), weight 70.1 kg (154 lb 8.7 oz), SpO2 100 %. Awake eyes open not interactive  Intubated RRR Coarse bs b/l No edema  A 1. AoCKD3, recovered UOP and improving labs 2. Hypernatremia, mild likely related to recovery diuresis + Diarrhea; on FWF 3. Septic Shock from proteus + VER UTI and Proteus vacteremia 4. Anemia of chronic disease 5. Septic shock resolved 6. VDRF   7. Diarrhea with concern for CDI  P 1. Cont Free Water titrated to normal serum sodium 2. No further renal recommendations  3. No f/u necessary at this time in our office 4. Will sign off for now.  Please call with any questions or concerns.    Pearson Grippe MD 04/16/2017, 8:52 AM  Recent Labs  Lab 04/13/17 0341  04/14/17 0242 04/15/17 0431 04/16/17 0328  NA 141   < > 144 149* 150*  K 3.8   < > 3.5 3.0* 3.0*  CL 110   < > 113* 110 112*  CO2 18*   < > 16* 22 25  GLUCOSE 163*   < > 255* 93 193*  BUN 73*   < > 79* 66* 65*  CREATININE 5.13*   < > 4.18* 2.50* 2.06*  CALCIUM 9.2   < > 9.0 8.9 8.7*  PHOS 5.0*  --  5.7* 3.7  --    < > = values in this interval not displayed.   Recent Labs  Lab 04/13/17 0341 04/14/17 0242 04/15/17 0431 04/16/17 0328  WBC 10.4 11.4* 12.3* 9.8  NEUTROABS 9.1* 10.1* 10.7*  --   HGB 10.1* 10.0* 10.7* 10.4*  HCT 31.6* 29.9* 32.1* 32.4*  MCV 91.6 89.0 87.9 89.8  PLT 188 160 143* 147*

## 2017-04-16 NOTE — Progress Notes (Signed)
Neurology Progress Note   S:// Patient seen and examined.  No acute events.   O:// Current vital signs: BP (!) 172/98   Pulse 91   Temp 99.9 F (37.7 C) (Oral)   Resp (!) 29   Ht '5\' 5"'$  (1.651 m)   Wt 70.1 kg (154 lb 8.7 oz)   SpO2 100%   BMI 25.72 kg/m  Vital signs in last 24 hours: Temp:  [98.4 F (36.9 C)-100.3 F (37.9 C)] 99.9 F (37.7 C) (02/18 0732) Pulse Rate:  [84-96] 91 (02/18 0800) Resp:  [0-29] 29 (02/18 0800) BP: (127-180)/(75-99) 172/98 (02/18 0800) SpO2:  [77 %-100 %] 100 % (02/18 0800) FiO2 (%):  [30 %] 30 % (02/18 0753) Weight:  [70.1 kg (154 lb 8.7 oz)] 70.1 kg (154 lb 8.7 oz) (02/18 0500) General: Intubated, no sedation, no apparent distress HEENT: Normocephalic, atraumatic Extremities: Warm well perfused Abdomen: Nondistended nontender Respiratory: Chest clear to auscultation Cardiovascular: S1-S2 heard, regular rate rhythm Neurological exam Patient is currently awake with eyes open.  Blinks on command.  Tracks on command.  Has a slight right-sided INO. Pupils are equal round reactive.   Difficult to ascertain facial asymmetry due to the endotracheal tube. No spontaneous movement. No movement to command No movement to mild noxious stimulus in either 4 extremities. Dampened deep tendon reflexes all over. Unable to assess cerebellar function   Medications  Current Facility-Administered Medications:  .  acetaminophen (TYLENOL) solution 650 mg, 650 mg, Per Tube, Q6H PRN, Riccio, Angela C, DO, 650 mg at 04/13/17 1726 .  cefTRIAXone (ROCEPHIN) 2 g in sodium chloride 0.9 % 100 mL IVPB, 2 g, Intravenous, Q24H, Byrum, Rose Fillers, MD, Stopped at 04/15/17 2125 .  chlorhexidine gluconate (MEDLINE KIT) (PERIDEX) 0.12 % solution 15 mL, 15 mL, Mouth Rinse, BID, Collene Gobble, MD, 15 mL at 04/16/17 0738 .  dextrose 50 % solution 25 mL, 25 mL, Intravenous, PRN, Quintella Reichert, MD .  famotidine (PEPCID) IVPB 20 mg in NS 100 mL IVPB, 20 mg, Intravenous, Q24H,  Tarry Kos, MD, 20 mg at 04/15/17 2100 .  feeding supplement (VITAL AF 1.2 CAL) liquid 1,000 mL, 1,000 mL, Per Tube, Continuous, Byrum, Rose Fillers, MD, Last Rate: 55 mL/hr at 04/16/17 0700, 1,000 mL at 04/16/17 0700 .  fentaNYL (SUBLIMAZE) injection 100 mcg, 100 mcg, Intravenous, Q15 min PRN, Omar Person, NP, 100 mcg at 04/13/17 0502 .  fentaNYL (SUBLIMAZE) injection 100 mcg, 100 mcg, Intravenous, Q2H PRN, Omar Person, NP, 100 mcg at 04/14/17 1022 .  free water 300 mL, 300 mL, Per Tube, Q6H, Byrum, Rose Fillers, MD, 300 mL at 04/16/17 0536 .  heparin injection 5,000 Units, 5,000 Units, Subcutaneous, Q8H, Omar Person, NP, 5,000 Units at 04/16/17 0536 .  hydrALAZINE (APRESOLINE) injection 10-20 mg, 10-20 mg, Intravenous, Q6H PRN, Collene Gobble, MD, 20 mg at 04/15/17 0951 .  insulin aspart (novoLOG) injection 0-15 Units, 0-15 Units, Subcutaneous, Q4H, Collene Gobble, MD, 2 Units at 04/16/17 [phone] .  insulin glargine (LANTUS) injection 35 Units, 35 Units, Subcutaneous, Daily, Riccio, Angela C, DO, 35 Units at 04/15/17 1100 .  linezolid (ZYVOX) IVPB 600 mg, 600 mg, Intravenous, Q12H, Wynell Balloon, RPH, Stopped at 04/15/17 2200 .  MEDLINE mouth rinse, 15 mL, Mouth Rinse, QID, Byrum, Rose Fillers, MD, 15 mL at 04/16/17 0400 .  midazolam (VERSED) injection 2 mg, 2 mg, Intravenous, Q15 min PRN, Omar Person, NP, 2 mg at 04/14/17 0040 .  midazolam (VERSED) injection  2 mg, 2 mg, Intravenous, Q2H PRN, Omar Person, NP, 2 mg at 04/14/17 1211 .  pneumococcal 23 valent vaccine (PNU-IMMUNE) injection 0.5 mL, 0.5 mL, Intramuscular, Tomorrow-1000, Rosenblatt, Marily Lente, MD .  potassium chloride 10 mEq in 100 mL IVPB, 10 mEq, Intravenous, Q1 Hr x 4, Oletta Darter Virgina Evener, MD, Stopped at 04/16/17 [phone] .  sodium bicarbonate 150 mEq in sterile water 1,000 mL infusion, , Intravenous, Continuous, Collene Gobble, MD, Last Rate: 30 mL/hr at 04/16/17 0700 Labs CBC    Component Value  Date/Time   WBC 9.8 04/16/2017 0328   RBC 3.61 (L) 04/16/2017 0328   HGB 10.4 (L) 04/16/2017 0328   HCT 32.4 (L) 04/16/2017 0328   PLT 147 (L) 04/16/2017 0328   MCV 89.8 04/16/2017 0328   MCH 28.8 04/16/2017 0328   MCHC 32.1 04/16/2017 0328   RDW 15.3 04/16/2017 0328   LYMPHSABS 1.1 04/15/2017 0431   MONOABS 0.4 04/15/2017 0431   EOSABS 0.0 04/15/2017 0431   BASOSABS 0.0 04/15/2017 0431    CMP     Component Value Date/Time   NA 150 (H) 04/16/2017 0328   NA 135 04/21/2015 0956   K 3.0 (L) 04/16/2017 0328   CL 112 (H) 04/16/2017 0328   CO2 25 04/16/2017 0328   GLUCOSE 193 (H) 04/16/2017 0328   BUN 65 (H) 04/16/2017 0328   BUN 11 04/21/2015 0956   CREATININE 2.06 (H) 04/16/2017 0328   CREATININE 0.79 08/28/2014 1223   CALCIUM 8.7 (L) 04/16/2017 0328   PROT 5.9 (L) 04/14/2017 0242   PROT 6.4 04/21/2015 0956   ALBUMIN 2.0 (L) 04/14/2017 0242   ALBUMIN 3.6 04/21/2015 0956   AST 37 04/14/2017 0242   ALT 50 04/14/2017 0242   ALKPHOS 100 04/14/2017 0242   BILITOT 0.6 04/14/2017 0242   BILITOT <0.2 04/21/2015 0956   GFRNONAA 27 (L) 04/16/2017 0328   GFRNONAA 89 08/28/2014 1223   GFRAA 31 (L) 04/16/2017 0328   GFRAA >89 08/28/2014 1223  LTM EEG 215-16 with generalized slowing along with stimulus induced GPD's indicative of diffuse encephalopathy.  No seizures.  Imaging I have reviewed images in epic and the results pertinent to this consultation are: CT-scan of the brain MRI examination of the brain from January 2019 showed multiple T2 hyperintensities in the brain and including the corpus callosum and cerebellar peduncles suggestive of MS.  This was a noncontrast scan.  Corpus callosum lesions have progressed since the prior comparison. Noncontrast CT of the head done on 04/15/2017 showed no acute findings.  Mild bifrontal subcortical white matter changes stable and compatible with known MS.  Mild chronic sinus disease.  Assessment:  52 year old woman with a past medical  history of severe multiple sclerosis who is very disabled at baseline, neurogenic bladder with chronic Foley, admitted with septic shock and gram-negative uremia that has progressed to acute renal failure and had acute respiratory failure requiring mechanical ventilation being followed by neurology for altered mental status. Her presentation is consistent with toxic metabolic encephalopathy in the setting of underlying metabolic derangements and infections. She had a recent rituximab infusion for her MS, which might make her more susceptible to infections. On presentation also had transaminitis.   Impression: Multifactorial toxic metabolic encephalopathy Underlying multiple sclerosis- unclear if this is an acute exacerbation  Recommendations: MRI brain and C-spine -  with and without Wednesday (give time to improve renal function) Check ammonia Continue to treat toxic/metabolic derangements Will follow after imaging. Plan discussed with the PCCM attending  in person on the unit.  -- Amie Portland, MD [facility] Pager: [phone] If 7pm to 7am, please call on call as listed on AMION.  CRITICAL CARE ATTESTATION This patient is critically ill and at significant risk of neurological worsening, death and care requires constant monitoring of vital signs, hemodynamics,respiratory and cardiac monitoring. I spent 35  minutes of neurocritical care time performing neurological assessment, discussion with family, other specialists and medical decision making of high complexityin the care of  this patient.

## 2017-04-16 NOTE — Consult Note (Addendum)
WOC Nurse wound consult note Reason for Consult: Consult requested for sacrum; area was noted as present on admission. Appearance is consistent with a deep tissue injury which has evolved in patchy areas; 20% darker colored skin, 80% stage 2 pressure injury where loose peeling skin has occurred, revealing pink moist wound bed. Pressure Injury POA: Yes Measurement: 7X6X.1cm Drainage (amount, consistency, odor) small amt yellow drainage, no odor Dressing procedure/placement/frequency: Pt is critically ill with multiple systemic factors which can impair healing.  She has a Flexiseal in place to attempt to contain stool, but it is leaking around the insterion site and it is difficult to keep the wound from becoming soiled related to the close proximity to the rectum. Foam dressing to protect and promote healing.  Pt is on a low airloss bed to reduce pressure.  No family members at the bedside to discuss plan of care. Please re-consult if further assistance is needed.  Thank-you,  Cammie Mcgee MSN, RN, CWOCN, [place], CNS [phone]

## 2017-04-16 NOTE — Progress Notes (Signed)
PULMONARY / CRITICAL CARE MEDICINE   Name: Debra [name] MRN: [id number] DOB: [birth year]    ADMISSION DATE:  04/12/2017 CONSULTATION DATE:  04/13/17   CHIEF COMPLAINT:  Altered mental startus  HISTORY OF PRESENT ILLNESS:   The patient was transferred from her NH facility too the ED for altered mental status.The patient quite obtunded presently. This appears to have started earlier today. She has fairly advanced Multiple sclerosis.The  Patient has a neurogenic bkladder and has been subject to UTIs in the past The patient is not ambulatory without help.She does eat normally as she does niot have a feeding tube The patient has her eyes open. Does not respond to my voice. Does withdraw to noxious stimuli.Has a decreased gag reflex.Will not follow commands. When the patient arrived in the ED her indwelling foley bag was empty.The catheter was changed and than the patient had a large output of somewhat bloody urine. Her BP was a little "soft" on presentation but came up to the normal ran ge after 2 liters of IV fluid.her creatinine is 5 (was 1+ less than 1 month ago) Her blood sugar is >400. The patient has dry mucosae and poor skin turgor. Her AG was 20-22. Bicarb was decreased to 17.The patient has a history of type 1 DM.o2 sat is 100% on RA.her urien showed many bacteria, WBCS, large Hb, 2+ PROTEINURIA.  Subjective / Interval events:   c dif panel ordered overnight for watery stools, flexiseal applied   STUDIES:  Continuous EEG 2/15 >> generalized slowing, diffuse encephalopathy no evidence of epileptiform activity or ictal evolution CT head 2/17 > no acute findings  CULTURES: Tracheal aspirate 2/15 >>  GI panel PCR 2/15 >> negative Blood 2/15 >> GNR >> Proteus BCID 2/15 >> proteus  Urine 2/114 >> proteus, R to Amp, nitrofurantoin; VRE  ANTIBIOTICS: Aztreonam x1 2/14 Cefepime 2/14 >> 2/15 Ceftriaxone 2/15 >>  Linezolid 2/17 >>   SIGNIFICANT EVENTS: Admitted  2/14  LINES/TUBES: Foley changed 2/14 >>  ETT 2/15 >>    VITAL SIGNS: BP (!) 172/98   Pulse 91   Temp 99.9 F (37.7 C) (Oral)   Resp (!) 29   Ht 5\' 5"  (1.651 m)   Wt 154 lb 8.7 oz (70.1 kg)   SpO2 100%   BMI 25.72 kg/m      VENT SETTINGS:  Vent Mode: PSV;CPAP FiO2 (%):  [30 %] 30 % Set Rate:  [18 bmp] 18 bmp Vt Set:  [500 mL] 500 mL PEEP:  [5 cmH20] 5 cmH20 Pressure Support:  [8 cmH20-12 cmH20] 12 cmH20 Plateau Pressure:  [15 cmH20-18 cmH20] 17 cmH20  INTAKE / OUTPUT: I/O last 3 completed shifts: In: 4584 [I.V.:1880.3; NG/GT:1603.8; IV Piggyback:1100] Out: 3980 [Urine:3630; Emesis/NG output:350]  PHYSICAL EXAMINATION: General: Ill-appearing, mechanically ventilated, sedated Neuro: Grimace with stimulation moves upper extremities HEENT: ET tube in place Cardiovascular: Regular, 3/6 systolic murmur, no edema Lungs: Decreased at both bases, otherwise clear Abdomen: Soft, benign, positive bowel sounds Musculoskeletal: Normal tone Skin: No apparent rash  LABS:  BMET Recent Labs  Lab 04/14/17 0242 04/15/17 0431 04/16/17 0328  NA 144 149* 150*  K 3.5 3.0* 3.0*  CL 113* 110 112*  CO2 16* 22 25  BUN 79* 66* 65*  CREATININE 4.18* 2.50* 2.06*  GLUCOSE 255* 93 193*    Electrolytes Recent Labs  Lab 04/13/17 0341  04/14/17 0242 04/15/17 0431 04/16/17 0328  CALCIUM 9.2   < > 9.0 8.9 8.7*  MG 2.5*  --  2.6* 2.3  2.4  PHOS 5.0*  --  5.7* 3.7  --    < > = values in this interval not displayed.    CBC Recent Labs  Lab 04/14/17 0242 04/15/17 0431 04/16/17 0328  WBC 11.4* 12.3* 9.8  HGB 10.0* 10.7* 10.4*  HCT 29.9* 32.1* 32.4*  PLT 160 143* 147*    Coag's Recent Labs  Lab 04/12/17 1519 04/14/17 0242  INR 1.53 1.24    Sepsis Markers Recent Labs  Lab 04/12/17 1534 04/12/17 2047 04/13/17 0341 04/14/17 0242  LATICACIDVEN 2.83*  --  2.8* 0.9  PROCALCITON  --  >150.00  --   --     ABG Recent Labs  Lab 04/13/17 0341 04/13/17 0555  PHART  7.357 7.239*  PCO2ART 27.9* 49.5*  PO2ART 82.0* 496.0*    Liver Enzymes Recent Labs  Lab 04/12/17 1235 04/14/17 0242  AST 93* 37  ALT 86* 50  ALKPHOS 89 100  BILITOT 1.3* 0.6  ALBUMIN 3.0* 2.0*    Cardiac Enzymes No results for input(s): TROPONINI, PROBNP in the last 168 hours.  Glucose Recent Labs  Lab 04/15/17 1229 04/15/17 1703 04/15/17 1923 04/16/17 0003 04/16/17 0336 04/16/17 0729  GLUCAP 95 143* 179* 191* 177* 226*    Imaging Ct Head Wo Contrast  Result Date: 04/15/2017 CLINICAL DATA:  Altered mental status this morning.  History of MS EXAM: CT HEAD WITHOUT CONTRAST TECHNIQUE: Contiguous axial images were obtained from the base of the skull through the vertex without intravenous contrast. COMPARISON:  04/12/2017 and 03/14/2017, MRI 03/14/2017 FINDINGS: Brain: Ventricles, cisterns and other CSF spaces are within normal. There is no mass, mass effect, shift midline structures or acute hemorrhage. No evidence of acute infarction. There is minimal focal low-attenuation over the subcortical white matter of the frontal lobes unchanged and likely due to known demyelinating disease. Vascular: No hyperdense vessel or unexpected calcification. Skull: Normal. Negative for fracture or focal lesion. Sinuses/Orbits: Orbits are within normal. Opacification of the left frontal and bilateral ethmoid sinus compatible with mild chronic inflammatory disease. Other: None. IMPRESSION: No acute findings. Mild bifrontal subcortical white matter changes which are stable compatible with patient's known MS. Mild chronic sinus inflammatory disease. Electronically Signed   By: Elberta Fortis M.D.   On: 04/15/2017 10:54   Dg Chest Port 1 View  Result Date: 04/16/2017 CLINICAL DATA:  Acute respiratory failure EXAM: PORTABLE CHEST 1 VIEW COMPARISON:  Yesterday FINDINGS: Endotracheal tube tip is between the clavicular heads and carina. An orogastric tube reaches the stomach. Borderline heart size  accentuated by technique. The lungs are clear. IMPRESSION: Improved aeration since yesterday.  No evidence of active disease. Electronically Signed   By: Marnee Spring M.D.   On: 04/16/2017 06:58    DISCUSSION: 52 year old woman history of severe multiple sclerosis and associated debilitation, neurogenic bladder with a chronic Foley catheter.  Admitted with septic shock, Proteus bacteremia, Proteus plus VRE UTI, progressed to acute renal failure requiring mechanical ventilation  ASSESSMENT / PLAN:  PULMONARY A: Acute respiratory failure, mechanical ventilation due to poor airway protection, neurological dysfunction P:   Continue current ventilator support.  She tolerate spontaneous breathing but does not have the mental status for extubation.  This will be the limiting factor VAP prevention orders We will start following NIF and vital capacity when she can participate.  Clearly her neuromuscular weakness will also influence ability to extubate Question whether she may ultimately require further treatment for her MS.  Hopefully this decompensation is all related to the acute  infectious process  CARDIOVASCULAR A:  Septic shock, improved Now hypertensive P:  Hydralazine if needed  RENAL A:   Acute renal failure, suspect obstructive uropathy given nonfunctioning chronic Foley catheter, now replaced; improving Hypernatremia Hypokalemia Neurogenic bladder P:   Foley catheter replaced, UOP improved Replace potassium Continue free water Follow urine output, BMP Appreciate nephrology assistance  GASTROINTESTINAL A:   SUP Transaminitis, consider shock liver, etiology unclear.  Improving P:   Continue Pepcid Follow LFT and coags intermittently Continue tube feeds  HEMATOLOGIC A:   Anemia of chronic disease and critical illness P:  Follow CBC Transfusion threshold hemoglobin > 7  INFECTIOUS A:   Septic shock, improved Proteus + VRE complicated UTI Proteus  bacteremia Respiratory culture with moderate GPC, moderate GNR; pending P:   Continue ceftriaxone based on Proteus sensitivities Continue linezolid for VRE Follow respiratory culture, chest x-ray, respiratory status.   ENDOCRINE A:   Diabetes mellitus P:   Sliding scale insulin Lantus 35  NEUROLOGIC A:   Severe multiple sclerosis, chronically debilitated.  Acute flare in the setting of infection Toxic metabolic encephalopathy due to sepsis, renal failure; acutely worse 2/17, not interacting at all P:   RASS goal: 0 Fentanyl and Versed as needed > has not required Follow NIF, FVC when she is able to participate ? Whether she may need neuro eval, targeted MS meds if slow to recover  FAMILY  - Updates: none at bedside 2/16-2/18 - Inter-disciplinary family meet or Palliative Care meeting due by: 04/19/17   Dolores Patty, DO PGY-2, [facility] 04/16/2017 8:43 AM

## 2017-04-17 ENCOUNTER — Inpatient Hospital Stay (HOSPITAL_COMMUNITY): Payer: Medicaid Other

## 2017-04-17 DIAGNOSIS — J9600 Acute respiratory failure, unspecified whether with hypoxia or hypercapnia: Secondary | ICD-10-CM

## 2017-04-17 LAB — BASIC METABOLIC PANEL
ANION GAP: 14 (ref 5–15)
BUN: 53 mg/dL — AB (ref 6–20)
CHLORIDE: 111 mmol/L (ref 101–111)
CO2: 25 mmol/L (ref 22–32)
Calcium: 8.7 mg/dL — ABNORMAL LOW (ref 8.9–10.3)
Creatinine, Ser: 1.73 mg/dL — ABNORMAL HIGH (ref 0.44–1.00)
GFR calc Af Amer: 39 mL/min — ABNORMAL LOW (ref >60)
GFR calc non Af Amer: 33 mL/min — ABNORMAL LOW (ref >60)
GLUCOSE: 250 mg/dL — AB (ref 65–99)
POTASSIUM: 3 mmol/L — AB (ref 3.5–5.1)
Sodium: 150 mmol/L — ABNORMAL HIGH (ref 135–145)

## 2017-04-17 LAB — CBC
HEMATOCRIT: 35.5 % — AB (ref 36.0–46.0)
HEMOGLOBIN: 11.3 g/dL — AB (ref 12.0–15.0)
MCH: 29 pg (ref 26.0–34.0)
MCHC: 31.8 g/dL (ref 30.0–36.0)
MCV: 91.3 fL (ref 78.0–100.0)
Platelets: 162 10*3/uL (ref 150–400)
RBC: 3.89 MIL/uL (ref 3.87–5.11)
RDW: 15.6 % — ABNORMAL HIGH (ref 11.5–15.5)
WBC: 12.1 10*3/uL — AB (ref 4.0–10.5)

## 2017-04-17 LAB — GLUCOSE, CAPILLARY
GLUCOSE-CAPILLARY: 184 mg/dL — AB (ref 65–99)
GLUCOSE-CAPILLARY: 119 mg/dL — AB (ref 65–99)
Glucose-Capillary: 210 mg/dL — ABNORMAL HIGH (ref 65–99)
Glucose-Capillary: 209 mg/dL — ABNORMAL HIGH (ref 65–99)
Glucose-Capillary: 177 mg/dL — ABNORMAL HIGH (ref 65–99)

## 2017-04-17 MED ORDER — POTASSIUM CHLORIDE 20 MEQ/15ML (10%) PO SOLN: 40.0000 meq | Freq: Once | ORAL | Status: DC

## 2017-04-17 MED ORDER — SODIUM CHLORIDE 0.45 % IV SOLN
INTRAVENOUS | Status: DC
  Administered 2017-04-17 – 2017-04-18 (×2): via INTRAVENOUS

## 2017-04-17 MED ORDER — POTASSIUM CHLORIDE 10 MEQ/100ML IV SOLN
10.0000 meq | INTRAVENOUS | Status: AC
  Administered 2017-04-17 (×4): 10 meq via INTRAVENOUS
  Filled 2017-04-17 (×4): qty 100

## 2017-04-17 NOTE — Progress Notes (Signed)
PULMONARY / CRITICAL CARE MEDICINE   Name: Debra [name] MRN: [id number] DOB: December 20, 1965    ADMISSION DATE:  04/12/2017 CONSULTATION DATE:  04/13/17   CHIEF COMPLAINT:  Altered mental startus  HISTORY OF PRESENT ILLNESS:   The patient was transferred from her NH facility too the ED for altered mental status.The patient quite obtunded presently. This appears to have started earlier today. She has fairly advanced Multiple sclerosis.The  Patient has a neurogenic bkladder and has been subject to UTIs in the past The patient is not ambulatory without help.She does eat normally as she does niot have a feeding tube The patient has her eyes open. Does not respond to my voice. Does withdraw to noxious stimuli.Has a decreased gag reflex.Will not follow commands. When the patient arrived in the ED her indwelling foley bag was empty.The catheter was changed and than the patient had a large output of somewhat bloody urine. Her BP was a little "soft" on presentation but came up to the normal ran ge after 2 liters of IV fluid.her creatinine is 5 (was 1+ less than 1 month ago) Her blood sugar is >400. The patient has dry mucosae and poor skin turgor. Her AG was 20-22. Bicarb was decreased to 17.The patient has a history of type 1 DM.o2 sat is 100% on RA.her urien showed many bacteria, WBCS, large Hb, 2+ PROTEINURIA.  Subjective / Interval events:   c dif panel ordered overnight for watery stools, flexiseal applied   STUDIES:  Continuous EEG 2/15 >> generalized slowing, diffuse encephalopathy no evidence of epileptiform activity or ictal evolution CT head 2/17 > no acute findings  CULTURES: Tracheal aspirate 2/15 >> normal respiratory flora GI panel PCR 2/15 >> negative Blood 2/15 >> GNR >> Proteus BCID 2/15 >> proteus  Urine 2/114 >> proteus, R to Amp, nitrofurantoin; VRE  ANTIBIOTICS: Aztreonam x1 2/14 Cefepime 2/14 >> 2/15 Ceftriaxone 2/15 >>  Linezolid 2/17 >>   SIGNIFICANT  EVENTS: Admitted 2/14  LINES/TUBES: Foley changed 2/14 >>  ETT 2/15 >>    VITAL SIGNS: BP (!) 170/82   Pulse 88   Temp 98.4 F (36.9 C) (Oral)   Resp (!) 23   Ht 5\' 5"  (1.651 m)   Wt 157 lb 3 oz (71.3 kg)   SpO2 98%   BMI 26.16 kg/m      VENT SETTINGS:  Vent Mode: CPAP;PSV FiO2 (%):  [30 %] 30 % Set Rate:  [18 bmp] 18 bmp Vt Set:  [500 mL] 500 mL PEEP:  [5 cmH20] 5 cmH20 Pressure Support:  [5 cmH20-12 cmH20] 5 cmH20 Plateau Pressure:  [16 cmH20-18 cmH20] 16 cmH20  INTAKE / OUTPUT: I/O last 3 completed shifts: In: 5195 [I.V.:330; Other:140; JG/GE:3662; IV Piggyback:1500] Out: 2525 [Urine:2525]  PHYSICAL EXAMINATION: General: Ill-appearing, mechanically ventilated, sedated Neuro: Grimace with stimulation moves upper extremities HEENT: ET tube in place Cardiovascular: Regular, 3/6 systolic murmur, no edema Lungs: Decreased at both bases, otherwise clear Abdomen: Soft, benign, positive bowel sounds Musculoskeletal: Normal tone Skin: No apparent rash  LABS:  BMET Recent Labs  Lab 04/15/17 0431 04/16/17 0328 04/17/17 0321  NA 149* 150* 150*  K 3.0* 3.0* 3.0*  CL 110 112* 111  CO2 22 25 25   BUN 66* 65* 53*  CREATININE 2.50* 2.06* 1.73*  GLUCOSE 93 193* 250*    Electrolytes Recent Labs  Lab 04/13/17 0341  04/14/17 0242 04/15/17 0431 04/16/17 0328 04/17/17 0321  CALCIUM 9.2   < > 9.0 8.9 8.7* 8.7*  MG 2.5*  --  2.6* 2.3 2.4  --   PHOS 5.0*  --  5.7* 3.7  --   --    < > = values in this interval not displayed.    CBC Recent Labs  Lab 04/15/17 0431 04/16/17 0328 04/17/17 0321  WBC 12.3* 9.8 12.1*  HGB 10.7* 10.4* 11.3*  HCT 32.1* 32.4* 35.5*  PLT 143* 147* 162    Coag's Recent Labs  Lab 04/12/17 1519 04/14/17 0242  INR 1.53 1.24    Sepsis Markers Recent Labs  Lab 04/12/17 1534 04/12/17 2047 04/13/17 0341 04/14/17 0242  LATICACIDVEN 2.83*  --  2.8* 0.9  PROCALCITON  --  >150.00  --   --     ABG Recent Labs  Lab  04/13/17 0341 04/13/17 0555  PHART 7.357 7.239*  PCO2ART 27.9* 49.5*  PO2ART 82.0* 496.0*    Liver Enzymes Recent Labs  Lab 04/12/17 1235 04/14/17 0242  AST 93* 37  ALT 86* 50  ALKPHOS 89 100  BILITOT 1.3* 0.6  ALBUMIN 3.0* 2.0*    Cardiac Enzymes No results for input(s): TROPONINI, PROBNP in the last 168 hours.  Glucose Recent Labs  Lab 04/16/17 1143 04/16/17 1527 04/16/17 2022 04/16/17 2337 04/17/17 0344 04/17/17 0807  GLUCAP 247* 216* 220* 174* 210* 209*    Imaging Dg Chest Port 1 View  Result Date: 04/17/2017 CLINICAL DATA:  Shortness of Breath EXAM: PORTABLE CHEST 1 VIEW COMPARISON:  04/16/2017 FINDINGS: Endotracheal tube and NG tube are unchanged. Cardiomegaly with mild vascular congestion. No confluent airspace opacities, effusions or edema. No acute bony abnormality. IMPRESSION: Cardiomegaly, vascular congestion. Electronically Signed   By: Charlett Nose M.D.   On: 04/17/2017 07:54    DISCUSSION: 52 year old woman history of severe multiple sclerosis and associated debilitation, neurogenic bladder with a chronic Foley catheter.  Admitted with septic shock, Proteus bacteremia, Proteus plus VRE UTI, progressed to acute renal failure requiring mechanical ventilation  ASSESSMENT / PLAN:  PULMONARY A: Acute respiratory failure, mechanical ventilation due to poor airway protection, neurological dysfunction P:   Will try for extubation today, mental status greatly improved Suction secretions aggressively  CARDIOVASCULAR A:  Septic shock, improved Now hypertensive P:  Hydralazine as needed Monitor on tele  RENAL A:   Acute renal failure, suspect obstructive uropathy given nonfunctioning chronic Foley catheter, now replaced; resolving Hypernatremia Hypokalemia Neurogenic bladder P:   Follow urine output, BMP Continue free water per tube  GASTROINTESTINAL A:   SUP Transaminitis, consider shock liver, etiology unclear- Improving P:   Continue  Pepcid Follow LFT and coags intermittently Will obtain swallow eval after extubation  HEMATOLOGIC A:   Anemia of chronic disease and critical illness P:  Follow CBC Transfusion threshold hemoglobin > 7  INFECTIOUS A:   Septic shock, improved Proteus + VRE complicated UTI Proteus bacteremia + C dif P:   Continue ceftriaxone based on Proteus sensitivities Continue linezolid for VRE Consider treating for c. Dif infection, however not symptomatic at this time  ENDOCRINE A:   Type 1 Diabetes mellitus P:   Sliding scale insulin Lantus 35  NEUROLOGIC A:   Severe multiple sclerosis, chronically debilitated-  Acute flare in the setting of infection Toxic metabolic encephalopathy due to sepsis, renal failure; acutely worse 2/17, not interacting at all P:   RASS goal: 0 Neurology following, appreciate recommendations- plan to possibly obtain further imaging of head and c-spine 2/20  FAMILY  - Updates: none at bedside 2/16-2/19 - Inter-disciplinary family meet or Palliative Care meeting due by: 04/19/17  Dolores Patty, DO PGY-2, [facility] 04/17/2017 10:59 AM

## 2017-04-17 NOTE — Procedures (Signed)
Extubation Procedure Note  Patient Details:   Name: Debra [name] DOB: Jan 07, 1966 MRN: [id number]   Airway Documentation:     Evaluation  O2 sats: stable throughout Complications: No apparent complications Patient did tolerate procedure well. Bilateral Breath Sounds: Clear   Yes   Patient extubated to 4lnc. Vital signs stable at this time. No complications. RN a bedside. RT will continue to monitor.  Ave Filter 04/17/2017, 11:39 AM

## 2017-04-17 NOTE — Progress Notes (Addendum)
Independently examined pt, evaluated data & formulated above care plan with NP/resident   52 year old nursing home resident with advanced multiple sclerosis and neurogenic bladder with chronic indwelling Foley admitted 2/14 with septic shock due to Proteus UTI/bacteremia.  Also had VRE in urine.  Shock is now resolved, she is weaning on pressure support/CPAP 8/5 with good tidal volume, has minimal secretions, alert and awake and follows commands, urine appears clear has mild watery stools. Labs were reviewed which shows hypokalemia and mild hyponatremia and improved leukocytosis. Chest x-ray was personally reviewed by me.  Impression/plan Septic shock resolved Acute respiratory failure-tolerating pressure support/CPAP, proceed with extubation Hypokalemia will be repleted. AKI/hypernatremia-change fluids to half normal saline Proteus bacteremia-treat with ceftriaxone for 14 days Linezolid for 7 days for VRE UTI. C. difficile toxin positive but is not being treated currently since diarrhea resolved  Neuro concerned about worsening MS, need to reassess need for reimaging  The patient is critically ill with multiple organ systems failure and requires high complexity decision making for assessment and support, frequent evaluation and titration of therapies, application of advanced monitoring technologies and extensive interpretation of multiple databases. Critical Care Time devoted to patient care services described in this note independent of APP/resident  time is 32 minutes.   Comer Locket Vassie Loll MD

## 2017-04-18 ENCOUNTER — Inpatient Hospital Stay (HOSPITAL_COMMUNITY): Payer: Medicaid Other

## 2017-04-18 DIAGNOSIS — N319 Neuromuscular dysfunction of bladder, unspecified: Secondary | ICD-10-CM

## 2017-04-18 DIAGNOSIS — I5030 Unspecified diastolic (congestive) heart failure: Secondary | ICD-10-CM

## 2017-04-18 DIAGNOSIS — R7881 Bacteremia: Secondary | ICD-10-CM

## 2017-04-18 DIAGNOSIS — N179 Acute kidney failure, unspecified: Secondary | ICD-10-CM

## 2017-04-18 DIAGNOSIS — E108 Type 1 diabetes mellitus with unspecified complications: Secondary | ICD-10-CM

## 2017-04-18 DIAGNOSIS — B964 Proteus (mirabilis) (morganii) as the cause of diseases classified elsewhere: Secondary | ICD-10-CM

## 2017-04-18 DIAGNOSIS — A0472 Enterocolitis due to Clostridium difficile, not specified as recurrent: Secondary | ICD-10-CM

## 2017-04-18 DIAGNOSIS — D638 Anemia in other chronic diseases classified elsewhere: Secondary | ICD-10-CM

## 2017-04-18 DIAGNOSIS — E1065 Type 1 diabetes mellitus with hyperglycemia: Secondary | ICD-10-CM

## 2017-04-18 LAB — MAGNESIUM: MAGNESIUM: 2 mg/dL (ref 1.7–2.4)

## 2017-04-18 LAB — BASIC METABOLIC PANEL
Anion gap: 11 (ref 5–15)
BUN: 38 mg/dL — AB (ref 6–20)
CO2: 25 mmol/L (ref 22–32)
Calcium: 8.4 mg/dL — ABNORMAL LOW (ref 8.9–10.3)
Chloride: 113 mmol/L — ABNORMAL HIGH (ref 101–111)
Creatinine, Ser: 1.34 mg/dL — ABNORMAL HIGH (ref 0.44–1.00)
GFR calc Af Amer: 53 mL/min — ABNORMAL LOW (ref >60)
GFR calc non Af Amer: 45 mL/min — ABNORMAL LOW (ref >60)
Glucose, Bld: 100 mg/dL — ABNORMAL HIGH (ref 65–99)
POTASSIUM: 3 mmol/L — AB (ref 3.5–5.1)
SODIUM: 149 mmol/L — AB (ref 135–145)

## 2017-04-18 LAB — CBC
HEMATOCRIT: 31.5 % — AB (ref 36.0–46.0)
HEMOGLOBIN: 10 g/dL — AB (ref 12.0–15.0)
MCH: 28.9 pg (ref 26.0–34.0)
MCHC: 31.7 g/dL (ref 30.0–36.0)
MCV: 91 fL (ref 78.0–100.0)
Platelets: 202 10*3/uL (ref 150–400)
RBC: 3.46 MIL/uL — AB (ref 3.87–5.11)
RDW: 15.5 % (ref 11.5–15.5)
WBC: 12.9 10*3/uL — ABNORMAL HIGH (ref 4.0–10.5)

## 2017-04-18 LAB — GLUCOSE, CAPILLARY
GLUCOSE-CAPILLARY: 101 mg/dL — AB (ref 65–99)
GLUCOSE-CAPILLARY: 53 mg/dL — AB (ref 65–99)
GLUCOSE-CAPILLARY: 88 mg/dL (ref 65–99)
Glucose-Capillary: 99 mg/dL (ref 65–99)
Glucose-Capillary: 85 mg/dL (ref 65–99)
Glucose-Capillary: 97 mg/dL (ref 65–99)
Glucose-Capillary: 170 mg/dL — ABNORMAL HIGH (ref 65–99)

## 2017-04-18 LAB — ECHOCARDIOGRAM COMPLETE
Height: 65 in
WEIGHTICAEL: 2472.68 [oz_av]

## 2017-04-18 LAB — POTASSIUM: POTASSIUM: 4.4 mmol/L (ref 3.5–5.1)

## 2017-04-18 MED ORDER — POTASSIUM CHLORIDE 10 MEQ/100ML IV SOLN
10.0000 meq | INTRAVENOUS | Status: AC
  Administered 2017-04-18 (×4): 10 meq via INTRAVENOUS
  Filled 2017-04-18 (×4): qty 100

## 2017-04-18 MED ORDER — VANCOMYCIN 50 MG/ML ORAL SOLUTION
125.0000 mg | Freq: Four times a day (QID) | ORAL | Status: DC
  Administered 2017-04-18 – 2017-04-24 (×25): 125 mg via ORAL
  Filled 2017-04-18 (×26): qty 2.5

## 2017-04-18 NOTE — Progress Notes (Signed)
Pulmonary mechanics performed with patient.  NIF -20 cm H2O  FVC 650   VT 300  Pt with good effort.  Several attempts made.

## 2017-04-18 NOTE — Evaluation (Signed)
Clinical/Bedside Swallow Evaluation Patient Details  Name: Debra [name] MRN: [id number] Date of Birth: August 05, 1965  Today's Date: 04/18/2017 Time: SLP Start Time (ACUTE ONLY): 1010 SLP Stop Time (ACUTE ONLY): 1031 SLP Time Calculation (min) (ACUTE ONLY): 21 min  Past Medical History:  Past Medical History:  Diagnosis Date  . Adenomatous colonic polyps   . Anemia    2005  . Anxiety    1990  . Arthritis   . Asthma    2000  . Cataract   . Depression with psychotic symptoms   . Difficult intubation    narrow airway  . Gastroesophageal Reflux Disease (GERD)   . Heart murmur    Birth  . Hyperlipidemia    2005  . Hypertension    1998  . Internal hemorrhoids 04/24/06   on colonoscopy  . Migraine   . Multiple sclerosis (HCC)   . Neuropathic bladder   . Neuropathy of the hands & feet   . Restless Leg Syndrome   . Right Ankle Fracture 10/06/2013  . Sleep paralysis   . Stable angina pectoris    2007: cath showing normal cors.   . Stroke 1990  . Type I Diabetes Mellitus 1988   Past Surgical History:  Past Surgical History:  Procedure Laterality Date  . bilateral foot surgery    . BREAST SURGERY Left    biopsy left breast  . CARDIAC CATHETERIZATION  2007   around 2007 or 2008  . CESAREAN SECTION    . CHOLECYSTECTOMY    . COLONOSCOPY W/ BIOPSIES AND POLYPECTOMY    . DILATION AND CURETTAGE OF UTERUS  12/29/2010   Surgeon: Tereso Newcomer, MD;  Location: [facility];  Service: Gynecology  . ENDOMETRIAL ABLATION W/ NOVASURE N/A 12/2009  . EYE SURGERY    . HYSTEROSCOPY  12/29/2010   Procedure: HYSTEROSCOPY WITH HYDROTHERMAL ABLATION;  Surgeon: Tereso Newcomer, MD;  Location: [facility];  Service: Gynecology;;  . IUD REMOVAL  12/29/2010   Procedure: INTRAUTERINE DEVICE (IUD) REMOVAL;  Surgeon: Tereso Newcomer, MD;  Location: [facility];  Service: Gynecology;  Laterality: N/A;  . LAPAROSCOPIC TUBAL LIGATION  12/29/2010   Procedure: LAPAROSCOPIC TUBAL LIGATION;  Surgeon: Tereso Newcomer, MD;  Location: [facility];  Service: Gynecology;  Laterality: Bilateral;  . ORIF ANKLE FRACTURE Right 10/09/2013   Procedure: Open reduction internal fixation right ankle;  Surgeon: Mable Paris, MD;  Location: [facility];  Service: Orthopedics;  Laterality: Right;  Open reduction internal fixation right ankle  . TRIGGER FINGER RELEASE     x 79   HPI:  52 year old woman history of severe multiple sclerosis and associated debilitation, neurogenic bladder with a chronic Foley catheter. Admitted with septic shock, Proteus bacteremia, Proteus plus VRE UTI, progressed to acute renal failure requiring mechanical ventilation 2/15-2/19. Pt was previously seen by SLP 03/16/17 with functional appearing swallow. Regular diet and thin liquids were recommended at that time. CXR upon admission without signs of acute infection.   Assessment / Plan / Recommendation Clinical Impression  Pt had one delayed, subtle cough after consecutive straw sips of thin liquids, with no further signs of potential airway compromise despite additional challenging, completing the 3 ounce water challenge multiple times and using liquid washes to clear her oral cavity of solids. Her posterior transit is slow with puree but incomplete with small bites of soft solids. Despite liquid and pureed washes, pt still needed to orally expectorate some of her bolus. Recommend Dys 1 diet today for ease  of oral prep and transit, allowing thin liquids. Would administer meds crushed in puree. SLP will f/u for tolerance and hopeful advancement. SLP Visit Diagnosis: Dysphagia, oral phase (R13.11)    Aspiration Risk  Mild aspiration risk    Diet Recommendation Dysphagia 1 (Puree);Thin liquid   Liquid Administration via: Cup;Straw Medication Administration: Crushed with puree Supervision: Staff to assist with self feeding;Full supervision/cueing for compensatory strategies Compensations: Slow rate;Small sips/bites Postural Changes: Seated  upright at 90 degrees    Other  Recommendations Oral Care Recommendations: Oral care BID Other Recommendations: Have oral suction available   Follow up Recommendations Skilled Nursing facility      Frequency and Duration min 2x/week  2 weeks       Prognosis Prognosis for Safe Diet Advancement: Good      Swallow Study   General HPI: 52 year old woman history of severe multiple sclerosis and associated debilitation, neurogenic bladder with a chronic Foley catheter. Admitted with septic shock, Proteus bacteremia, Proteus plus VRE UTI, progressed to acute renal failure requiring mechanical ventilation 2/15-2/19. Pt was previously seen by SLP 03/16/17 with functional appearing swallow. Regular diet and thin liquids were recommended at that time. CXR upon admission without signs of acute infection. Type of Study: Bedside Swallow Evaluation Previous Swallow Assessment: see HPI Diet Prior to this Study: NPO Temperature Spikes Noted: No Respiratory Status: Nasal cannula History of Recent Intubation: Yes Length of Intubations (days): 4 days Date extubated: 04/17/17 Behavior/Cognition: Alert;Cooperative Oral Cavity Assessment: Within Functional Limits Oral Care Completed by SLP: Recent completion by staff Oral Cavity - Dentition: Dentures, top;Dentures, bottom Vision: Functional for self-feeding Self-Feeding Abilities: Total assist Patient Positioning: Upright in bed Baseline Vocal Quality: Other (comment)(clear but weak) Volitional Cough: Weak    Oral/Motor/Sensory Function Overall Oral Motor/Sensory Function: Generalized oral weakness(questionable mild left droop?)   Ice Chips Ice chips: Within functional limits Presentation: Spoon   Thin Liquid Thin Liquid: Impaired Presentation: Cup;Straw Pharyngeal  Phase Impairments: Cough - Delayed(x1)    Nectar Thick Nectar Thick Liquid: Not tested   Honey Thick Honey Thick Liquid: Not tested   Puree Puree: Impaired Presentation:  Spoon Oral Phase Functional Implications: Prolonged oral transit   Solid   GO   Solid: Impaired Oral Phase Impairments: Impaired mastication Oral Phase Functional Implications: Impaired mastication;Prolonged oral transit;Other (comment)(pt orally expectorated some of the bolus)        Maxcine Ham 04/18/2017,10:59 AM   Maxcine Ham, M.A. CCC-SLP [phone]

## 2017-04-18 NOTE — Progress Notes (Signed)
  Echocardiogram 2D Echocardiogram has been performed.  Debra [name] Debra [name] 04/18/2017, 11:49 AM

## 2017-04-18 NOTE — Progress Notes (Signed)
PROGRESS NOTE    Debra [name]  ZOX:[id number] DOB: 1965-11-08 DOA: 04/12/2017 PCP: [name], MD   Brief Narrative:  52 year old PMHx CVA, Anxiety, Depression with psychotic symptoms, Sleep paralysis,Multiple sclerosis (HCC), Neuropathic bladder, Neuropathy of the hands & feet, Restless Leg Syndrome  Adenomatous colonic polyps,Internal hemorrhoids Anemia, Asthma, Difficult intubation, GERD, Heart murmur, HTN,Stable angina pectoris, HLD,  Right Ankle Fracture (10/06/2013), Type I Diabetes Mellitus .  Transferred from [facility] too the ED for altered mental status.The patient quite obtunded presently. This appears to have started earlier today. She has fairly advanced Multiple sclerosis.The  Patient has a neurogenic bkladder and has been subject to UTIs in the past The patient is not ambulatory without help.She does eat normally as she does niot have a feeding tube The patient has her eyes open. Does not respond to my voice. Does withdraw to noxious stimuli.Has a decreased gag reflex.Will not follow commands. When the patient arrived in the ED her indwelling foley bag was empty.The catheter was changed and than the patient had a large output of somewhat bloody urine. Her BP was a little "soft" on presentation but came up to the normal ran ge after 2 liters of IV fluid.her creatinine is 5 (was 1+ less than 1 month ago) Her blood sugar is >400. The patient has dry mucosae and poor skin turgor. Her AG was 20-22. Bicarb was decreased to 17.The patient has a history of type 1 DM.o2 sat is 100% on RA.her urien showed many bacteria, WBCS, large Hb, 2+ PROTEINURIA.    Subjective: 2/20 A/O 3 (does not know why), negative CP, negative SOB, negative abdominal pain. Positive diarrhea. Positive flat affect. Patient unsure who treats her MS.    Assessment & Plan:   Active Problems:   Acute encephalopathy   Sepsis due to urinary tract infection (HCC)   At high risk for  aspiration   Endotracheally intubated   Pneumonia due to infectious organism   Pressure injury of skin   Acute respiratory failure (HCC)   Acute metabolic encephalopathy  Acute respiratory failure, mechanical ventilation due to poor airway protection, neurological dysfunction -Resolved    Septic shock/ positive Proteus Mirabilis (resistant ampicillin and nitrofurantoin)/VRE UTI. positive Proteus Mirabilis (ampicillin resistant) bacteremia/positive C. difficile -Treat with appropriate antibiotics -Echocardiogram pending     Acute renal failure -, suspect obstructive uropathy given nonfunctioning chronic Foley catheter, now replaced; resolving Recent Labs  Lab 04/13/17 0341 04/13/17 1857 04/14/17 0242 04/15/17 0431 04/16/17 0328 04/17/17 0321 04/18/17 0241  CREATININE 5.13* 4.95* 4.18* 2.50* 2.06* 1.73* 1.34*  -Resolving  Hypernatremia  Hypokalemia -Potassium IV 40 mEq -Recheck K/Mg@ 1500   Multiple Sclerosis -Severe multiple sclerosis in setting of infection. Currently not on medication. -Who is her MS neurologist? -  Neurogenic bladder -Secondary to MS? -Continue catheterization -Strict in and out     Anemia of chronic disease and critical illness -Negative sign of acute bleed  -Transfusion hemoglobin <7     Diabetes type 1 uncontrolled with complications  -12/23/2016 Hemoglobin A1c= 11.4 -A1c pending -Lipid panel pending          DVT prophylaxis: Subcutaneous heparin Code Status: Full Family Communication: None Disposition Plan: TBD   Consultants:  [facility]   Procedures/Significant Events:  Continuous EEG 2/15 >> generalized slowing, diffuse encephalopathy no evidence of epileptiform activity or ictal evolution CT head 2/17 > no acute findings      I have personally reviewed and interpreted all radiology studies and my  findings are as above.  VENTILATOR SETTINGS:    Cultures 2/14 blood positive Proteus Mirabilis  (ampicillin resistant) 2/14 urine positive Proteus Mirabilis (resistant ampicillin and nitrofurantoin)/VRE 2/15 tracheal aspirate normal respiratory flora 2/15 GI panel negative 2/17 positive C. difficile antigen/C. difficile toxigenic by PCR     Antimicrobials: Anti-infectives (From admission, onward)   Start     Stop   04/18/17 1400  vancomycin (VANCOCIN) 50 mg/mL oral solution 125 mg     04/28/17 1359   04/15/17 1200  linezolid (ZYVOX) IVPB 600 mg     04/22/17 0959   04/13/17 2100  cefTRIAXone (ROCEPHIN) 2 g in sodium chloride 0.9 % 100 mL IVPB         04/13/17 1600  ceFEPIme (MAXIPIME) 250 mg in dextrose 5 % 50 mL IVPB  Status:  Discontinued     04/12/17 1623   04/12/17 2100  ceFEPIme (MAXIPIME) 1 g in sodium chloride 0.9 % 100 mL IVPB  Status:  Discontinued     04/13/17 1024   04/12/17 1245  aztreonam (AZACTAM) 2 g in sodium chloride 0.9 % 100 mL IVPB     04/12/17 1327       Devices    LINES / TUBES:      Continuous Infusions: . sodium chloride 75 mL/hr at 04/18/17 0600  . cefTRIAXone (ROCEPHIN)  IV Stopped (04/17/17 2106)  . linezolid (ZYVOX) IV Stopped (04/17/17 2248)  . potassium chloride 10 mEq (04/18/17 0740)     Objective: Vitals:   04/18/17 0500 04/18/17 0600 04/18/17 0700 04/18/17 0743  BP: (!) 175/77 (!) 166/79 (!) 153/69   Pulse: 94 84 86   Resp: (!) 28 18 20    Temp:    98.2 F (36.8 C)  TempSrc:    Oral  SpO2: 92% 98% 98%   Weight: 154 lb 8.7 oz (70.1 kg)     Height:        Intake/Output Summary (Last 24 hours) at 04/18/2017 0834 Last data filed at 04/18/2017 0600 Gross per 24 hour  Intake 1627.5 ml  Output 1740 ml  Net -112.5 ml   Filed Weights   04/16/17 0500 04/17/17 0420 04/18/17 0500  Weight: 154 lb 8.7 oz (70.1 kg) 157 lb 3 oz (71.3 kg) 154 lb 8.7 oz (70.1 kg)    Examination:  General: A/O 3 (does not know why), No acute respiratory distress Neck:  Negative scars, masses, torticollis, lymphadenopathy, JVD Lungs: Clear to  auscultation bilaterally without wheezes or crackles Cardiovascular: Regular rate and rhythm without murmur gallop or rub normal S1 and S2 Abdomen: negative abdominal pain, nondistended, positive soft, bowel sounds, no rebound, no ascites, no appreciable mass Extremities: No significant cyanosis, clubbing, or edema bilateral lower extremities Skin: Negative rashes, lesions, ulcers Psychiatric:  Negative depression, negative anxiety, negative fatigue, negative mania, positive flat affect Central nervous system:  Cranial nerves II through XII intact, tongue/uvula midline, muscle strength bilateral upper extremity 4/5, bilateral upper extremity sensation intact. LLE sensation decreased/absent, RLE sensation intact.  negative dysarthria, negative expressive aphasia, negative receptive aphasia.  .     Data Reviewed: Care during the described time interval was provided by me .  I have reviewed this patient's available data, including medical history, events of note, physical examination, and all test results as part of my evaluation.   CBC: Recent Labs  Lab 04/12/17 1235 04/13/17 0341 04/14/17 0242 04/15/17 0431 04/16/17 0328 04/17/17 0321 04/18/17 0241  WBC 13.8* 10.4 11.4* 12.3* 9.8 12.1* 12.9*  NEUTROABS 11.6* 9.1*  10.1* 10.7*  --   --   --   HGB 12.2 10.1* 10.0* 10.7* 10.4* 11.3* 10.0*  HCT 37.5 31.6* 29.9* 32.1* 32.4* 35.5* 31.5*  MCV 91.7 91.6 89.0 87.9 89.8 91.3 91.0  PLT 230 188 160 143* 147* 162 202   Basic Metabolic Panel: Recent Labs  Lab 04/13/17 0341  04/14/17 0242 04/15/17 0431 04/16/17 0328 04/17/17 0321 04/18/17 0241  NA 141   < > 144 149* 150* 150* 149*  K 3.8   < > 3.5 3.0* 3.0* 3.0* 3.0*  CL 110   < > 113* 110 112* 111 113*  CO2 18*   < > 16* 22 25 25 25   GLUCOSE 163*   < > 255* 93 193* 250* 100*  BUN 73*   < > 79* 66* 65* 53* 38*  CREATININE 5.13*   < > 4.18* 2.50* 2.06* 1.73* 1.34*  CALCIUM 9.2   < > 9.0 8.9 8.7* 8.7* 8.4*  MG 2.5*  --  2.6* 2.3 2.4  --    --   PHOS 5.0*  --  5.7* 3.7  --   --   --    < > = values in this interval not displayed.   GFR: Estimated Creatinine Clearance: 49.3 mL/min (A) (by C-G formula based on SCr of 1.34 mg/dL (H)). Liver Function Tests: Recent Labs  Lab 04/12/17 1235 04/14/17 0242  AST 93* 37  ALT 86* 50  ALKPHOS 89 100  BILITOT 1.3* 0.6  PROT 7.0 5.9*  ALBUMIN 3.0* 2.0*   No results for input(s): LIPASE, AMYLASE in the last 168 hours. Recent Labs  Lab 04/16/17 1029  AMMONIA 18   Coagulation Profile: Recent Labs  Lab 04/12/17 1519 04/14/17 0242  INR 1.53 1.24   Cardiac Enzymes: Recent Labs  Lab 04/12/17 2047 04/13/17 0430 04/14/17 0242 04/15/17 0431  CKTOTAL 3,191* 2,304* 1,180* 930*   BNP (last 3 results) No results for input(s): PROBNP in the last 8760 hours. HbA1C: No results for input(s): HGBA1C in the last 72 hours. CBG: Recent Labs  Lab 04/17/17 1527 04/17/17 1919 04/18/17 0026 04/18/17 0401 04/18/17 0739  GLUCAP 177* 119* 88 99 85   Lipid Profile: No results for input(s): CHOL, HDL, LDLCALC, TRIG, CHOLHDL, LDLDIRECT in the last 72 hours. Thyroid Function Tests: No results for input(s): TSH, T4TOTAL, FREET4, T3FREE, THYROIDAB in the last 72 hours. Anemia Panel: No results for input(s): VITAMINB12, FOLATE, FERRITIN, TIBC, IRON, RETICCTPCT in the last 72 hours. Urine analysis:    Component Value Date/Time   COLORURINE AMBER (A) 04/12/2017 1346   APPEARANCEUR CLOUDY (A) 04/12/2017 1346   LABSPEC 1.011 04/12/2017 1346   PHURINE 8.0 04/12/2017 1346   GLUCOSEU NEGATIVE 04/12/2017 1346   HGBUR LARGE (A) 04/12/2017 1346   BILIRUBINUR NEGATIVE 04/12/2017 1346   BILIRUBINUR neg 11/07/2016 1158   KETONESUR NEGATIVE 04/12/2017 1346   PROTEINUR 100 (A) 04/12/2017 1346   UROBILINOGEN 0.2 11/07/2016 1158   UROBILINOGEN 0.2 04/10/2007 0855   NITRITE NEGATIVE 04/12/2017 1346   LEUKOCYTESUR LARGE (A) 04/12/2017 1346   Sepsis  Labs: @LABRCNTIP (procalcitonin:4,lacticidven:4)  ) Recent Results (from the past 240 hour(s))  Blood Culture (routine x 2)     Status: Abnormal   Collection Time: 04/12/17 12:50 PM  Result Value Ref Range Status   Specimen Description BLOOD LEFT ANTECUBITAL  Final   Special Requests   Final    BOTTLES DRAWN AEROBIC AND ANAEROBIC Blood Culture adequate volume   Culture  Setup Time   Final  GRAM NEGATIVE RODS IN BOTH AEROBIC AND ANAEROBIC BOTTLES CRITICAL RESULT CALLED TO, READ BACK BY AND VERIFIED WITH: Lieutenant Diego [id number] MLM Performed at [facility], [address] [place], [place] [address]    Culture PROTEUS MIRABILIS (A)  Final   Report Status 04/15/2017 FINAL  Final   Organism ID, Bacteria PROTEUS MIRABILIS  Final      Susceptibility   Proteus mirabilis - MIC*    AMPICILLIN >=32 RESISTANT Resistant     CEFAZOLIN <=4 SENSITIVE Sensitive     CEFEPIME <=1 SENSITIVE Sensitive     CEFTAZIDIME <=1 SENSITIVE Sensitive     CEFTRIAXONE <=1 SENSITIVE Sensitive     CIPROFLOXACIN <=0.25 SENSITIVE Sensitive     GENTAMICIN <=1 SENSITIVE Sensitive     IMIPENEM 2 SENSITIVE Sensitive     TRIMETH/SULFA <=20 SENSITIVE Sensitive     AMPICILLIN/SULBACTAM 8 SENSITIVE Sensitive     PIP/TAZO <=4 SENSITIVE Sensitive     * PROTEUS MIRABILIS  Blood Culture ID Panel (Reflexed)     Status: Abnormal   Collection Time: 04/12/17 12:50 PM  Result Value Ref Range Status   Enterococcus species NOT DETECTED NOT DETECTED Final   Listeria monocytogenes NOT DETECTED NOT DETECTED Final   Staphylococcus species NOT DETECTED NOT DETECTED Final   Staphylococcus aureus NOT DETECTED NOT DETECTED Final   Streptococcus species NOT DETECTED NOT DETECTED Final   Streptococcus agalactiae NOT DETECTED NOT DETECTED Final   Streptococcus pneumoniae NOT DETECTED NOT DETECTED Final   Streptococcus pyogenes NOT DETECTED NOT DETECTED Final   Acinetobacter baumannii NOT DETECTED NOT DETECTED Final    Enterobacteriaceae species DETECTED (A) NOT DETECTED Final    Comment: Enterobacteriaceae represent a large family of gram-negative bacteria, not a single organism. CRITICAL RESULT CALLED TO, READ BACK BY AND VERIFIED WITH: PHARMD G ABBOTT [id number] [phone] MLM    Enterobacter cloacae complex NOT DETECTED NOT DETECTED Final   Escherichia coli NOT DETECTED NOT DETECTED Final   Klebsiella oxytoca NOT DETECTED NOT DETECTED Final   Klebsiella pneumoniae NOT DETECTED NOT DETECTED Final   Proteus species DETECTED (A) NOT DETECTED Final    Comment: CRITICAL RESULT CALLED TO, READ BACK BY AND VERIFIED WITH: PHARMD G ABBOTT [id number] MLM    Serratia marcescens NOT DETECTED NOT DETECTED Final   Carbapenem resistance NOT DETECTED NOT DETECTED Final   Haemophilus influenzae NOT DETECTED NOT DETECTED Final   Neisseria meningitidis NOT DETECTED NOT DETECTED Final   Pseudomonas aeruginosa NOT DETECTED NOT DETECTED Final   Candida albicans NOT DETECTED NOT DETECTED Final   Candida glabrata NOT DETECTED NOT DETECTED Final   Candida krusei NOT DETECTED NOT DETECTED Final   Candida parapsilosis NOT DETECTED NOT DETECTED Final   Candida tropicalis NOT DETECTED NOT DETECTED Final    Comment: Performed at [facility], [address] [place], [place] [address]  Blood Culture (routine x 2)     Status: Abnormal   Collection Time: 04/12/17 12:52 PM  Result Value Ref Range Status   Specimen Description BLOOD LEFT FOREARM  Final   Special Requests IN PEDIATRIC BOTTLE Blood Culture adequate volume  Final   Culture  Setup Time   Final    GRAM NEGATIVE RODS IN PEDIATRIC BOTTLE CRITICAL VALUE NOTED.  VALUE IS CONSISTENT WITH PREVIOUSLY REPORTED AND CALLED VALUE.    Culture (A)  Final    PROTEUS MIRABILIS SUSCEPTIBILITIES PERFORMED ON PREVIOUS CULTURE WITHIN THE LAST 5 DAYS. Performed at [facility], [address] [place], [place] [address]    Report Status 04/15/2017 FINAL  Final  Urine culture      Status: Abnormal   Collection Time: 04/12/17  1:46 PM  Result Value Ref Range Status   Specimen Description URINE, RANDOM  Final   Special Requests   Final    NONE Performed at [facility], [address], [place], [place] [address]    Culture (A)  Final    >=100,000 COLONIES/mL PROTEUS MIRABILIS >=100,000 COLONIES/mL VANCOMYCIN RESISTANT ENTEROCOCCUS    Report Status 04/15/2017 FINAL  Final   Organism ID, Bacteria PROTEUS MIRABILIS (A)  Final   Organism ID, Bacteria VANCOMYCIN RESISTANT ENTEROCOCCUS (A)  Final      Susceptibility   Proteus mirabilis - MIC*    AMPICILLIN >=32 RESISTANT Resistant     CEFAZOLIN <=4 SENSITIVE Sensitive     CEFTRIAXONE <=1 SENSITIVE Sensitive     CIPROFLOXACIN <=0.25 SENSITIVE Sensitive     GENTAMICIN <=1 SENSITIVE Sensitive     IMIPENEM 4 SENSITIVE Sensitive     NITROFURANTOIN 256 RESISTANT Resistant     TRIMETH/SULFA <=20 SENSITIVE Sensitive     AMPICILLIN/SULBACTAM 8 SENSITIVE Sensitive     PIP/TAZO <=4 SENSITIVE Sensitive     * >=100,000 COLONIES/mL PROTEUS MIRABILIS   Vancomycin resistant enterococcus - MIC*    AMPICILLIN <=2 SENSITIVE Sensitive     LEVOFLOXACIN >=8 RESISTANT Resistant     NITROFURANTOIN <=16 SENSITIVE Sensitive     VANCOMYCIN >=32 RESISTANT Resistant     LINEZOLID 2 SENSITIVE Sensitive     * >=100,000 COLONIES/mL VANCOMYCIN RESISTANT ENTEROCOCCUS  MRSA PCR Screening     Status: None   Collection Time: 04/12/17  9:51 PM  Result Value Ref Range Status   MRSA by PCR NEGATIVE NEGATIVE Final    Comment:        The GeneXpert MRSA Assay (FDA approved for NASAL specimens only), is one component of a comprehensive MRSA colonization surveillance program. It is not intended to diagnose MRSA infection nor to guide or monitor treatment for MRSA infections. Performed at [facility], [address] [place], [place] [address]   Culture, respiratory (NON-Expectorated)     Status: None   Collection Time: 04/13/17   5:14 AM  Result Value Ref Range Status   Specimen Description TRACHEAL ASPIRATE  Final   Special Requests NONE  Final   Gram Stain   Final    FEW WBC PRESENT, PREDOMINANTLY PMN MODERATE GRAM POSITIVE COCCI IN PAIRS MODERATE GRAM NEGATIVE RODS FEW GRAM POSITIVE RODS    Culture   Final    MODERATE Consistent with normal respiratory flora. Performed at [facility], [address], [place], [place] [address]    Report Status 04/15/2017 FINAL  Final  Gastrointestinal Panel by PCR , Stool     Status: None   Collection Time: 04/13/17  5:45 AM  Result Value Ref Range Status   Campylobacter species NOT DETECTED NOT DETECTED Final   Plesimonas shigelloides NOT DETECTED NOT DETECTED Final   Salmonella species NOT DETECTED NOT DETECTED Final   Yersinia enterocolitica NOT DETECTED NOT DETECTED Final   Vibrio species NOT DETECTED NOT DETECTED Final   Vibrio cholerae NOT DETECTED NOT DETECTED Final   Enteroaggregative E coli (EAEC) NOT DETECTED NOT DETECTED Final   Enteropathogenic E coli (EPEC) NOT DETECTED NOT DETECTED Final   Enterotoxigenic E coli (ETEC) NOT DETECTED NOT DETECTED Final   Shiga like toxin producing E coli (STEC)  NOT DETECTED NOT DETECTED Final   Shigella/Enteroinvasive E coli (EIEC) NOT DETECTED NOT DETECTED Final   Cryptosporidium NOT DETECTED NOT DETECTED Final   Cyclospora cayetanensis NOT DETECTED NOT DETECTED Final   Entamoeba histolytica NOT DETECTED NOT DETECTED Final   Giardia lamblia NOT DETECTED NOT DETECTED Final   Adenovirus F40/41 NOT DETECTED NOT DETECTED Final   Astrovirus NOT DETECTED NOT DETECTED Final   Norovirus GI/GII NOT DETECTED NOT DETECTED Final   Rotavirus A NOT DETECTED NOT DETECTED Final   Sapovirus (I, II, IV, and V) NOT DETECTED NOT DETECTED Final    Comment: Performed at [facility], [address] [place], [place] [address]  C difficile quick scan w PCR reflex     Status: Abnormal   Collection Time: 04/15/17  9:31 PM    Result Value Ref Range Status   C Diff antigen POSITIVE (A) NEGATIVE Final   C Diff toxin NEGATIVE NEGATIVE Final   C Diff interpretation Results are indeterminate. See PCR results.  Final    Comment: Performed at [facility], [address], [place], [place] [address]  C. Diff by PCR, Reflexed     Status: Abnormal   Collection Time: 04/15/17  9:31 PM  Result Value Ref Range Status   Toxigenic C. Difficile by PCR POSITIVE (A) NEGATIVE Final    Comment: Positive for toxigenic C. difficile with little to no toxin production. Only treat if clinical presentation suggests symptomatic illness.         Radiology Studies: Dg Chest Port 1 View  Result Date: 04/17/2017 CLINICAL DATA:  Shortness of Breath EXAM: PORTABLE CHEST 1 VIEW COMPARISON:  04/16/2017 FINDINGS: Endotracheal tube and NG tube are unchanged. Cardiomegaly with mild vascular congestion. No confluent airspace opacities, effusions or edema. No acute bony abnormality. IMPRESSION: Cardiomegaly, vascular congestion. Electronically Signed   By: Charlett Nose M.D.   On: 04/17/2017 07:54        Scheduled Meds: . heparin  5,000 Units Subcutaneous Q8H  . insulin aspart  0-15 Units Subcutaneous Q4H  . insulin glargine  35 Units Subcutaneous Daily  . mouth rinse  15 mL Mouth Rinse QID  . pneumococcal 23 valent vaccine  0.5 mL Intramuscular Tomorrow-1000   Continuous Infusions: . sodium chloride 75 mL/hr at 04/18/17 0600  . cefTRIAXone (ROCEPHIN)  IV Stopped (04/17/17 2106)  . linezolid (ZYVOX) IV Stopped (04/17/17 2248)  . potassium chloride 10 mEq (04/18/17 0740)     LOS: 6 days    Time spent: 40 minutes    WOODS, Roselind Messier, MD [facility] Pager [phone]   If 7PM-7AM, please contact night-coverage [email or web address] Password [facility] 04/18/2017, 8:34 AM

## 2017-04-18 NOTE — Progress Notes (Signed)
Neurology Progress Note   S:// Seen and examined.  Extubated yesterday.  Doing well in terms of respiratory status. Mental status also has significantly improved from 2 days ago.  O:// Current vital signs: BP (!) 153/69   Pulse 86   Temp 98.2 F (36.8 C) (Oral)   Resp 20   Ht 5\' 5"  (1.651 m)   Wt 70.1 kg (154 lb 8.7 oz)   SpO2 98%   BMI 25.72 kg/m  Vital signs in last 24 hours: Temp:  [97.7 F (36.5 C)-98.4 F (36.9 C)] 98.2 F (36.8 C) (02/20 0743) Pulse Rate:  [82-94] 86 (02/20 0700) Resp:  [15-29] 20 (02/20 0700) BP: (119-195)/(69-137) 153/69 (02/20 0700) SpO2:  [92 %-100 %] 98 % (02/20 0700) Weight:  [70.1 kg (154 lb 8.7 oz)] 70.1 kg (154 lb 8.7 oz) (02/20 0500)  General: Awake alert oriented in no distress HEENT: Normocephalic, atraumatic Extremities: Warm and well-perfused Abdomen: Nondistended nontender Respiratory: Chest clear to auscultation Cardia vascular: S1-S2 heard, regular rate rhythm Neurological exam Patient is currently awake, alert, oriented x2. Her speech is not dysarthric. Naming, attention and repetition is intact. Attention and concentration is slightly impaired. Cranial nerves: Pupils are equal round reactive to light, mild right-sided INO, visual fields full, face is symmetric, palate elevates midline, tongue midline. Motor exam: Antigravity 4+/5 in both upper extremities.  Barely 3/5 in both lower extremities. Normal tone normal range of motion. Dampened deep tendon reflexes all over Finger-nose-finger intact.  Medications  Current Facility-Administered Medications:  .  0.45 % sodium chloride infusion, , Intravenous, Continuous, Riccio, Angela C, DO, Last Rate: 75 mL/hr at 04/18/17 0600 .  acetaminophen (TYLENOL) solution 650 mg, 650 mg, Per Tube, Q6H PRN, Riccio, Angela C, DO, 650 mg at 04/13/17 1726 .  cefTRIAXone (ROCEPHIN) 2 g in sodium chloride 0.9 % 100 mL IVPB, 2 g, Intravenous, Q24H, Byrum, Les Pou, MD, Stopped at 04/17/17 2106 .   dextrose 50 % solution 25 mL, 25 mL, Intravenous, PRN, Tilden Fossa, MD .  heparin injection 5,000 Units, 5,000 Units, Subcutaneous, Q8H, Eubanks, Katalina M, NP, 5,000 Units at 04/18/17 0617 .  hydrALAZINE (APRESOLINE) injection 10-20 mg, 10-20 mg, Intravenous, Q6H PRN, Leslye Peer, MD, 20 mg at 04/18/17 0237 .  insulin aspart (novoLOG) injection 0-15 Units, 0-15 Units, Subcutaneous, Q4H, Leslye Peer, MD, 3 Units at 04/17/17 1604 .  insulin glargine (LANTUS) injection 35 Units, 35 Units, Subcutaneous, Daily, Riccio, Angela C, DO, 35 Units at 04/17/17 0900 .  linezolid (ZYVOX) IVPB 600 mg, 600 mg, Intravenous, Q12H, Riccio, Angela C, DO, Stopped at 04/17/17 2248 .  MEDLINE mouth rinse, 15 mL, Mouth Rinse, QID, Leslye Peer, MD, 15 mL at 04/18/17 0618 .  pneumococcal 23 valent vaccine (PNU-IMMUNE) injection 0.5 mL, 0.5 mL, Intramuscular, Tomorrow-1000, Otho Najjar, MD .  potassium chloride 10 mEq in 100 mL IVPB, 10 mEq, Intravenous, Q1 Hr x 4, Henry Russel, MD, Last Rate: 100 mL/hr at 04/18/17 0740, 10 mEq at 04/18/17 0740 Labs CBC    Component Value Date/Time   WBC 12.9 (H) 04/18/2017 0241   RBC 3.46 (L) 04/18/2017 0241   HGB 10.0 (L) 04/18/2017 0241   HCT 31.5 (L) 04/18/2017 0241   PLT 202 04/18/2017 0241   MCV 91.0 04/18/2017 0241   MCH 28.9 04/18/2017 0241   MCHC 31.7 04/18/2017 0241   RDW 15.5 04/18/2017 0241   LYMPHSABS 1.1 04/15/2017 0431   MONOABS 0.4 04/15/2017 0431   EOSABS 0.0 04/15/2017 0431  BASOSABS 0.0 04/15/2017 0431    CMP     Component Value Date/Time   NA 149 (H) 04/18/2017 0241   NA 135 04/21/2015 0956   K 3.0 (L) 04/18/2017 0241   CL 113 (H) 04/18/2017 0241   CO2 25 04/18/2017 0241   GLUCOSE 100 (H) 04/18/2017 0241   BUN 38 (H) 04/18/2017 0241   BUN 11 04/21/2015 0956   CREATININE 1.34 (H) 04/18/2017 0241   CREATININE 0.79 08/28/2014 1223   CALCIUM 8.4 (L) 04/18/2017 0241   PROT 5.9 (L) 04/14/2017 0242   PROT 6.4 04/21/2015 0956    ALBUMIN 2.0 (L) 04/14/2017 0242   ALBUMIN 3.6 04/21/2015 0956   AST 37 04/14/2017 0242   ALT 50 04/14/2017 0242   ALKPHOS 100 04/14/2017 0242   BILITOT 0.6 04/14/2017 0242   BILITOT <0.2 04/21/2015 0956   GFRNONAA 45 (L) 04/18/2017 0241   GFRNONAA 89 08/28/2014 1223   GFRAA 53 (L) 04/18/2017 0241   GFRAA >89 08/28/2014 1223   LTM EEG 04/13/2017 to 16 2019 showed generalized slowing with stimulus induced GPD's indicative of diffuse encephalopathy.  No seizures.  Imaging I have reviewed images in epic and the results pertinent to this consultation are: MRI of the brain from January 2019 showed multiple T2 hyperintensities in the brain including progression of the hyperintensities that were already present in the corpus callosum.  There is also hyperintensities in the cerebellar peduncles consistent with a diagnosis of MS.  Assessment:  52 year old woman with a past medical history of multiple sclerosis was very disabled at baseline, neurogenic bladder with chronic Foley admitted with septic shock with gram-negative organisms and was uremic that progressed to acute renal failure had acute respiratory failure requiring mechanical ventilation, being followed by neurology for altered mental status. Her presentation is most consistent with toxic metabolic encephalopathy in the setting of underlying infection and metabolic derangements. She had a recent rituximab infusion for MS, probably reducing her immunity. She also had transaminitis on presentation. My examination from 2 days ago was markedly different from today.  Today she is more awake alert and oriented. She is also extubated. 2 days ago, she would only blink to command and not move her arms or legs raising concern for possible new lesions or spinal lesions. Today my suspicion for those are low. I had initially planned to do an MRI today, which I would now hold off on.  Impression: Toxic metabolic encephalopathy in the setting of  infections/metabolic derangements in a patient with severe MS at baseline.  Recommendations: No emergent need to do an MRI right now. Continue to treat toxic metabolic derangements per primary team as you are. She will need close outpatient follow-up regarding her MS and treatment of her MS. PT OT ST Neu[facility] will be available as needed.  Please call with questions.  -- Milon Dikes, MD [facility] Pager: [phone] If 7pm to 7am, please call on call as listed on AMION.  CRITICAL CARE ATTESTATION This patient is critically ill and at significant risk of neurological worsening, death and care requires constant monitoring of vital signs, hemodynamics,respiratory and cardiac monitoring. I spent 35  minutes of neurocritical care time performing neurological assessment, discussion with family, other specialists and medical decision making of high complexityin the care of  this patient.

## 2017-04-18 NOTE — Progress Notes (Signed)
NIF and VC performed per MD order.  Nif of -12cm H2O and VC of 0.5L. Pt with decent effort attempted several times and pt obtained the same.

## 2017-04-18 NOTE — Plan of Care (Signed)
  Nutrition: Adequate nutrition will be maintained 04/18/2017 1324 - Progressing by Leanord Hawking, RN   Coping: Level of anxiety will decrease 04/18/2017 1324 - Progressing by Leanord Hawking, RN  Pt still experiencing restlessness & anxiety at times Skin Integrity: Risk for impaired skin integrity will decrease 04/18/2017 1324 - Progressing by Leanord Hawking, RN  Pt being turned & repositioned Q2 Nutritional: Maintenance of adequate nutrition will improve 04/18/2017 1324 - Progressing by Leanord Hawking, RN   Nutritional: Progress toward achieving an optimal weight will improve 04/18/2017 1324 - Progressing by Leanord Hawking, RN  Skin Integrity: Risk for impaired skin integrity will decrease 04/18/2017 1324 - Progressing by Leanord Hawking, RN  Pt being turned & repositioned Q2  Clinical Measurements: Will remain free from infection 04/18/2017 1324 - Not Progressing by Leanord Hawking, RN

## 2017-04-18 NOTE — Progress Notes (Signed)
[facility] ADULT ICU REPLACEMENT PROTOCOL FOR AM LAB REPLACEMENT ONLY  The patient does apply for the [facility] Adult ICU Electrolyte Replacment Protocol based on the criteria listed below:   1. Is GFR >/= 40 ml/min? Yes.    Patient's GFR today is 53 2. Is urine output >/= 0.5 ml/kg/hr for the last 6 hours? Yes.   Patient's UOP is 1.5 ml/kg/hr 3. Is BUN < 60 mg/dL? Yes.    Patient's BUN today is 38 4. Abnormal electrolyte(s): K-3.0 5. Ordered repletion with: per protocol 6. If a panic level lab has been reported, has the CCM MD in charge been notified? Yes.  .   Physician:  Dr. Samantha Crimes, Dixon Boos 04/18/2017 6:23 AM

## 2017-04-19 LAB — LIPID PANEL
CHOL/HDL RATIO: 3.3 ratio
Cholesterol: 85 mg/dL (ref 0–200)
HDL: 26 mg/dL — AB (ref >40)
LDL Cholesterol: 27 mg/dL (ref 0–99)
TRIGLYCERIDES: 162 mg/dL — AB (ref <150)
VLDL: 32 mg/dL (ref 0–40)

## 2017-04-19 LAB — BASIC METABOLIC PANEL
ANION GAP: 12 (ref 5–15)
BUN: 28 mg/dL — ABNORMAL HIGH (ref 6–20)
CO2: 21 mmol/L — ABNORMAL LOW (ref 22–32)
Calcium: 8 mg/dL — ABNORMAL LOW (ref 8.9–10.3)
Chloride: 106 mmol/L (ref 101–111)
Creatinine, Ser: 1.11 mg/dL — ABNORMAL HIGH (ref 0.44–1.00)
GFR calc Af Amer: >60 mL/min (ref >60)
GFR, EST NON AFRICAN AMERICAN: 57 mL/min — AB (ref >60)
GLUCOSE: 215 mg/dL — AB (ref 65–99)
POTASSIUM: 4 mmol/L (ref 3.5–5.1)
Sodium: 139 mmol/L (ref 135–145)

## 2017-04-19 LAB — GLUCOSE, CAPILLARY
GLUCOSE-CAPILLARY: 115 mg/dL — AB (ref 65–99)
GLUCOSE-CAPILLARY: 132 mg/dL — AB (ref 65–99)
GLUCOSE-CAPILLARY: 208 mg/dL — AB (ref 65–99)
GLUCOSE-CAPILLARY: 40 mg/dL — AB (ref 65–99)
Glucose-Capillary: 76 mg/dL (ref 65–99)
Glucose-Capillary: 209 mg/dL — ABNORMAL HIGH (ref 65–99)
Glucose-Capillary: 192 mg/dL — ABNORMAL HIGH (ref 65–99)
Glucose-Capillary: 179 mg/dL — ABNORMAL HIGH (ref 65–99)
Glucose-Capillary: 80 mg/dL (ref 65–99)

## 2017-04-19 LAB — CBC
HCT: 32.2 % — ABNORMAL LOW (ref 36.0–46.0)
HEMOGLOBIN: 10.7 g/dL — AB (ref 12.0–15.0)
MCH: 30.1 pg (ref 26.0–34.0)
MCHC: 33.2 g/dL (ref 30.0–36.0)
MCV: 90.4 fL (ref 78.0–100.0)
PLATELETS: 179 10*3/uL (ref 150–400)
RBC: 3.56 MIL/uL — AB (ref 3.87–5.11)
RDW: 15.6 % — ABNORMAL HIGH (ref 11.5–15.5)
WBC: 14.6 10*3/uL — AB (ref 4.0–10.5)

## 2017-04-19 LAB — MAGNESIUM: MAGNESIUM: 1.9 mg/dL (ref 1.7–2.4)

## 2017-04-19 LAB — HEMOGLOBIN A1C
Hgb A1c MFr Bld: 10.3 % — ABNORMAL HIGH (ref 4.8–5.6)
Mean Plasma Glucose: 248.91 mg/dL

## 2017-04-19 MED ORDER — LINEZOLID 600 MG PO TABS
600.0000 mg | ORAL_TABLET | Freq: Two times a day (BID) | ORAL | Status: AC
  Administered 2017-04-19 – 2017-04-21 (×6): 600 mg via ORAL
  Filled 2017-04-19 (×6): qty 1

## 2017-04-19 MED ORDER — PREMIER PROTEIN SHAKE
11.0000 [oz_av] | Freq: Two times a day (BID) | ORAL | Status: DC
  Administered 2017-04-19 – 2017-04-22 (×3): 11 [oz_av] via ORAL
  Filled 2017-04-19 (×13): qty 325.31

## 2017-04-19 MED ORDER — SULFAMETHOXAZOLE-TRIMETHOPRIM 800-160 MG PO TABS
1.0000 | ORAL_TABLET | Freq: Two times a day (BID) | ORAL | Status: AC
  Administered 2017-04-19 – 2017-04-22 (×6): 1 via ORAL
  Filled 2017-04-19 (×6): qty 1

## 2017-04-19 NOTE — Progress Notes (Signed)
Pt lost IV access this AM and all attempts at getting new access were unsuccessful. MD aware. Per MD Picc placement not favorable. All antibiotics switched to oral. Currently no IV access, will continue to monitor.

## 2017-04-19 NOTE — Evaluation (Signed)
Occupational Therapy Evaluation Patient Details Name: Debra [name] MRN: [id number] DOB: 1965-09-23 Today's Date: 04/19/2017    History of Present Illness 52 year old woman with a past medical history of multiple sclerosis was very disabled at baseline, neurogenic bladder with chronic Foley admitted with septic shock with gram-negative organisms and was uremic that progressed to acute renal failure had acute respiratory failure requiring mechanical ventilation, being followed by neurology for altered mental status.   Clinical Impression   Patient presenting with decreased strength, endurance, self care, balance, and safety awareness.Patient reports being bed bound at SNF level PTA.  Patient will benefit from acute OT to increase overall independence in the areas of ADLs, functional mobility, and safety awareness in order to safely discharge to next venue of care. OT requesting RN order abdominal binder and TEDs to attempt supine >sit with pt.    Follow Up Recommendations  SNF    Equipment Recommendations  Other (comment)(defer to next venue of care)    Recommendations for Other Services Other (comment)(none at this time)     Precautions / Restrictions Precautions Precautions: Fall Precaution Comments: patient reports has issues with orthostatic hypotension Restrictions Weight Bearing Restrictions: No      Mobility Bed Mobility Overal bed mobility: Needs Assistance Bed Mobility: Rolling Rolling: Min assist         General bed mobility comments: Pt rolling with min A L <> R with use of bed rail with flat HOB. Min verbal cues for hand placement and technique  Transfers     General transfer comment: deferred per pt due to pain and BP issues        ADL either performed or assessed with clinical judgement   ADL Overall ADL's : Needs assistance/impaired     Grooming: Set up;Bed level   Upper Body Bathing: Set up;Bed level   Lower Body Bathing: Bed level;Total  assistance   Upper Body Dressing : Set up;Bed level   Lower Body Dressing: Total assistance;Bed level      General ADL Comments: Pt reports being bedbound since Sept of 2018. Pt verbalized total A for LB self care from bed level     Vision Baseline Vision/History: No visual deficits Patient Visual Report: No change from baseline              Pertinent Vitals/Pain Faces Pain Scale: Hurts whole lot Pain Location: hip and shoulder pain with movement Pain Descriptors / Indicators: Aching;Grimacing Pain Intervention(s): Monitored during session;Repositioned;Limited activity within patient's tolerance     Hand Dominance Right   Extremity/Trunk Assessment Upper Extremity Assessment Upper Extremity Assessment: Generalized weakness RUE Deficits / Details: AAROM WFL, strength grossly 3-/5 shoulder flexion, 4-/5 elbow flexion, 3/5 extension. Pt reports pain with shoulder flexion LUE Deficits / Details: AAROM WFL, strength grossly 3+/5 shoulder flexion, 4-/5 elbow flexion, 3/5 extension. Pt reports pain with shoulder flexion.   Lower Extremity Assessment Lower Extremity Assessment: Defer to PT evaluation          Cognition Arousal/Alertness: Awake/alert Behavior During Therapy: WFL for tasks assessed/performed Overall Cognitive Status: Within Functional Limits for tasks assessed                       Home Living Family/patient expects to be discharged to:: Skilled nursing facility                    OT Problem List: Decreased strength;Decreased knowledge of use of DME or AE;Decreased activity tolerance;Impaired balance (sitting and/or standing);Decreased  safety awareness;Pain      OT Treatment/Interventions: Self-care/ADL training;Therapeutic exercise;Patient/family education;Neuromuscular education;Balance training;Energy conservation;Therapeutic activities;DME and/or AE instruction    OT Goals(Current goals can be found in the care plan section) Acute Rehab OT  Goals Patient Stated Goal: " to walk again" OT Goal Formulation: With patient Time For Goal Achievement: 05/03/17 Potential to Achieve Goals: Poor ADL Goals Pt Will Perform Lower Body Bathing: with mod assist;bed level Pt Will Perform Lower Body Dressing: with mod assist;bed level Additional ADL Goal #1: Pt will perform static sitting on EOB for 2 minutes with max A sitting balance or less.  OT Frequency: Min 2X/week   Barriers to D/C:    none known at this time          AM-PAC PT "6 Clicks" Daily Activity     Outcome Measure Help from another person eating meals?: A Little Help from another person taking care of personal grooming?: A Little Help from another person toileting, which includes using toliet, bedpan, or urinal?: Total Help from another person bathing (including washing, rinsing, drying)?: A Lot Help from another person to put on and taking off regular upper body clothing?: A Lot Help from another person to put on and taking off regular lower body clothing?: A Lot 6 Click Score: 13   End of Session Equipment Utilized During Treatment: Oxygen Nurse Communication: Other (comment)(bed mobility, pt checking skin while therapist assisted with rolling)  Activity Tolerance: Patient limited by pain Patient left: in bed;with bed alarm set;with nursing/sitter in room  OT Visit Diagnosis: Muscle weakness (generalized) (M62.81);Dizziness and giddiness (R42)                Time: 1594-5859 OT Time Calculation (min): 24 min Charges:  OT General Charges $OT Visit: 1 Visit OT Evaluation $OT Eval Moderate Complexity: 1 Mod OT Treatments $Therapeutic Activity: 8-22 mins   Menucha Dicesare P, MS, OTR/L 04/19/2017, 2:26 PM

## 2017-04-19 NOTE — Evaluation (Addendum)
Physical Therapy Evaluation Patient Details Name: Elaura Nachtman MRN: [id number] DOB: Mar 01, 1965 Today's Date: 04/19/2017   History of Present Illness  52 year old woman with a past medical history of multiple sclerosis was very disabled at baseline, neurogenic bladder with chronic Foley admitted with septic shock with gram-negative organisms and was uremic that progressed to acute renal failure had acute respiratory failure requiring mechanical ventilation, being followed by neurology for altered mental status.  Clinical Impression  Patient presents with decreased mobility reporting basically bedbound since exacerbation in September last year.  She reports issues with BP limiting mobility but states not addressed by medical team.  Feel she can benefit from inpatient rehab for more intense therapies and medical management of symptoms due to prolonged immobility.  She has potential to achieve transfers and w/c mobility but currently mod A to roll in bed and reports not able to tolerate EOB with pain and orthostatic hypotension.  Will attempt EOB with binder and TEDs if available next session.  PT to follow acutely.    Follow Up Recommendations CIR    Equipment Recommendations  Other (comment)(TBA)    Recommendations for Other Services Rehab consult     Precautions / Restrictions Precautions Precautions: Fall Precaution Comments: patient reports has issues with orthostatic hypotension      Mobility  Bed Mobility Overal bed mobility: Needs Assistance Bed Mobility: Rolling Rolling: Mod assist;+2 for safety/equipment         General bed mobility comments: assist to roll to back from R sidelying and to L from back, scooting up in bed pt able to pull some with L UE on head board with HOB flat and +2 A for hips  Transfers                 General transfer comment: deferred per pt due to pain and BP issues  Ambulation/Gait                Stairs             Wheelchair Mobility    Modified Rankin (Stroke Patients Only)       Balance                                             Pertinent Vitals/Pain Faces Pain Scale: Hurts whole lot Pain Location: hip and shoulder pain with movement Pain Descriptors / Indicators: Aching;Grimacing;Discomfort Pain Intervention(s): Monitored during session;Repositioned;Limited activity within patient's tolerance    Home Living Family/patient expects to be discharged to:: Skilled nursing facility                      Prior Function Level of Independence: Needs assistance   Gait / Transfers Assistance Needed: reports has not been walking since September, states not even OOB or EOB at nursing facility due to pain in back and hips and orthostatic hypotension           Hand Dominance   Dominant Hand: Right    Extremity/Trunk Assessment   Upper Extremity Assessment Upper Extremity Assessment: RUE deficits/detail;LUE deficits/detail RUE Deficits / Details: AAROM WFL, strength grossly 3-/5 shoulder flexion, 4-/5 elbow flexion, 3/5 extension LUE Deficits / Details: AAROM WFL, strength grossly 3+/5 shoulder flexion, 4-/5 elbow flexion, 3/5 extension    Lower Extremity Assessment Lower Extremity Assessment: RLE deficits/detail;LLE deficits/detail RLE Deficits / Details: AAROM limited hip extension with  some hypertonicity, strength hip flexion 2+/5, knee extension 2/5, ankle DF 0/5 RLE Sensation: decreased light touch;decreased proprioception LLE Deficits / Details: AAROM limited hip extension with some hypertonicity, strength hip flexion 2+/5, knee extension 2/5, ankle DF 0/5 LLE Sensation: decreased light touch;decreased proprioception       Communication   Communication: Other (comment)(hoarse whisper with recent intubation)  Cognition Arousal/Alertness: Awake/alert Behavior During Therapy: WFL for tasks assessed/performed Overall Cognitive Status: Within Functional  Limits for tasks assessed                                 General Comments: patient giving accurate history      General Comments      Exercises Other Exercises Other Exercises: PROM/AAROM bilateral LE for stretching into extension   Assessment/Plan    PT Assessment Patient needs continued PT services  PT Problem List Decreased range of motion;Decreased strength;Decreased mobility;Impaired tone;Decreased activity tolerance;Decreased balance       PT Treatment Interventions Functional mobility training;Balance training;Patient/family education;Therapeutic activities;Therapeutic exercise    PT Goals (Current goals can be found in the Care Plan section)  Acute Rehab PT Goals Patient Stated Goal: To get stronger PT Goal Formulation: With patient Time For Goal Achievement: 05/03/17 Potential to Achieve Goals: Fair    Frequency Min 3X/week   Barriers to discharge        Co-evaluation               AM-PAC PT "6 Clicks" Daily Activity  Outcome Measure Difficulty turning over in bed (including adjusting bedclothes, sheets and blankets)?: Unable Difficulty moving from lying on back to sitting on the side of the bed? : Unable Difficulty sitting down on and standing up from a chair with arms (e.g., wheelchair, bedside commode, etc,.)?: Unable Help needed moving to and from a bed to chair (including a wheelchair)?: Total Help needed walking in hospital room?: Total Help needed climbing 3-5 steps with a railing? : Total 6 Click Score: 6    End of Session   Activity Tolerance: Patient limited by pain Patient left: in bed;with call bell/phone within reach   PT Visit Diagnosis: Muscle weakness (generalized) (M62.81)    Time: 0623-7628 PT Time Calculation (min) (ACUTE ONLY): 27 min   Charges:   PT Evaluation $PT Eval Moderate Complexity: 1 Mod PT Treatments $Therapeutic Activity: 8-22 mins   PT G CodesSheran Lawless,  [place] [phone] 04/19/2017   [name] 04/19/2017, 10:16 AM

## 2017-04-19 NOTE — Progress Notes (Signed)
Pt's NIF -25 and VC best of 3 attempts . Patient had good technique.

## 2017-04-19 NOTE — [place] FL2 (Signed)
[place] MEDICAID FL2 LEVEL OF CARE SCREENING TOOL     IDENTIFICATION  Patient Name: Debra [name] Birthdate: 05/12/1965 Sex: female Admission Date (Current Location): 04/12/2017  [facility] and [place] Number:  Producer, television/film/video and Address:  [facility], [address] [place], [place] [address]      Provider Number: [id number]  Attending Physician Name and Address:  Drema Dallas, MD  Relative Name and Phone Number:  Dorene Grebe [phone]    Current Level of Care: Hospital Recommended Level of Care: Skilled Nursing Facility Prior Approval Number:    Date Approved/Denied:   PASRR Number: [id number]  Discharge Plan: SNF    Current Diagnoses: Patient Active Problem List   Diagnosis Date Noted  . Acute respiratory failure (HCC)   . Pressure injury of skin 04/14/2017  . At high risk for aspiration   . Endotracheally intubated   . Pneumonia due to infectious organism   . Sepsis due to urinary tract infection (HCC) 04/12/2017  . Acute encephalopathy 03/15/2017  . Aortic atherosclerosis (HCC) 03/14/2017  . Altered mental status 03/14/2017  . Urinary tract infection 03/14/2017  . Major depressive disorder, single episode, severe without psychosis (HCC) 01/02/2017  . Multiple sclerosis exacerbation (HCC) 01/01/2017  . Dysthymia 12/27/2016  . Major depressive disorder, recurrent, severe with psychotic features (HCC)   . Multiple joint pain   . HTN (hypertension)   . Acute pain of left knee   . Neurogenic bladder   . Acute lower UTI   . Labile blood pressure   . Acute blood loss anemia   . Leukocytosis   . Labile blood glucose   . Hypertensive crisis   . Hypoglycemia   . Poorly controlled type 2 diabetes mellitus with peripheral neuropathy (HCC)   . Uncontrolled hypertension   . Sleep disturbance   . Multiple sclerosis (HCC) 11/22/2016  . Thoracic root lesion 11/22/2016  . MS (multiple sclerosis) (HCC)   . Neuropathic  pain   . Uncontrolled type 2 diabetes mellitus with peripheral neuropathy (HCC)   . Urinary retention   . Medically noncompliant   . Leg weakness, bilateral 11/20/2016  . Diabetic ketoacidosis without coma associated with type 1 diabetes mellitus (HCC)   . Leg pain   . History of CVA (cerebrovascular accident)   . Uncontrolled type 1 diabetes mellitus with diabetic peripheral neuropathy (HCC)   . Benign essential HTN   . Hypertensive urgency   . AKI (acute kidney injury) (HCC)   . Fibromyalgia 11/07/2016  . Back pain 10/31/2016  . Stable angina pectoris (HCC)   . Vitamin D deficiency 08/31/2014  . 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 Hyperlipidemia 10/06/2013  . Depression 10/16/2012  . Bilateral knee pain 02/16/2012  . Chronic diarrhea 10/19/2010  . Peripheral neuropathy 08/19/2010  . Type 1 diabetes mellitus (HCC) 07/06/2010  . Hypertension 07/06/2010  . Asthma 07/06/2010  . Migraine 07/06/2010    Orientation RESPIRATION BLADDER Height & Weight     Self, Time, Place  O2(Nasal cannula 2L) Incontinent, Indwelling catheter Weight: 77 kg (169 lb 12.1 oz) Height:  5\' 5"  (165.1 cm)  BEHAVIORAL SYMPTOMS/MOOD NEUROLOGICAL BOWEL NUTRITION STATUS      Incontinent Diet(Please see DC Summary)  AMBULATORY STATUS COMMUNICATION OF NEEDS Skin   Extensive Assist Verbally PU Stage and Appropriate Care(Stage II on sacrum)  Personal Care Assistance Level of Assistance  Bathing, Feeding, Dressing Bathing Assistance: Maximum assistance Feeding assistance: Limited assistance Dressing Assistance: Limited assistance     Functional Limitations Info             SPECIAL CARE FACTORS FREQUENCY                       Contractures      Additional Factors Info  Code Status, Allergies, Insulin Sliding Scale Code Status Info: Full Allergies Info: Penicillins, Pollen Extract, Tape   Insulin Sliding Scale Info: Every 4  hours       Current Medications (04/19/2017):  This is the current hospital active medication list Current Facility-Administered Medications  Medication Dose Route Frequency Provider Last Rate Last Dose  . 0.45 % sodium chloride infusion   Intravenous Continuous Tillman Sers, DO   Stopped at 04/19/17 0820  . acetaminophen (TYLENOL) solution 650 mg  650 mg Per Tube Q6H PRN Dolores Patty C, DO   650 mg at 04/19/17 0009  . cefTRIAXone (ROCEPHIN) 2 g in sodium chloride 0.9 % 100 mL IVPB  2 g Intravenous Q24H Leslye Peer, MD   Stopped at 04/18/17 2208  . dextrose 50 % solution 25 mL  25 mL Intravenous PRN Tilden Fossa, MD      . heparin injection 5,000 Units  5,000 Units Subcutaneous Q8H Tobey Grim, NP   5,000 Units at 04/19/17 [phone]  . hydrALAZINE (APRESOLINE) injection 10-20 mg  10-20 mg Intravenous Q6H PRN Leslye Peer, MD   20 mg at 04/18/17 2329  . insulin aspart (novoLOG) injection 0-15 Units  0-15 Units Subcutaneous Q4H Leslye Peer, MD   3 Units at 04/19/17 [phone]  . insulin glargine (LANTUS) injection 35 Units  35 Units Subcutaneous Daily Dolores Patty C, DO   35 Units at 04/19/17 0905  . linezolid (ZYVOX) IVPB 600 mg  600 mg Intravenous Q12H Dolores Patty C, DO   Stopped at 04/19/17 0128  . MEDLINE mouth rinse  15 mL Mouth Rinse QID Leslye Peer, MD   15 mL at 04/19/17 0454  . pneumococcal 23 valent vaccine (PNU-IMMUNE) injection 0.5 mL  0.5 mL Intramuscular Tomorrow-1000 Otho Najjar, MD      . vancomycin (VANCOCIN) 50 mg/mL oral solution 125 mg  125 mg Oral QID Drema Dallas, MD   125 mg at 04/19/17 9643     Discharge Medications: Please see discharge summary for a list of discharge medications.  Relevant Imaging Results:  Relevant Lab Results:   Additional Information SSN [id number]  Mearl Latin, LCSWA

## 2017-04-19 NOTE — Progress Notes (Signed)
Inpatient Rehabilitation  Per PT request, patient was screened by Fae Pippin for appropriateness for an Inpatient Acute Rehab consult.  Patient is known to our service from previous admission.  She requires assist at baseline, which she doesn't have.  She is from a SNF and recommend return to SNF upon discharge.    Charlane Ferretti., CCC/SLP Admission Coordinator  [facility] Inpatient Rehabilitation  Cell [phone]

## 2017-04-19 NOTE — Progress Notes (Signed)
PROGRESS NOTE    Debra [name]  ZOX:[id number] DOB: [birth year] DOA: 04/12/2017 PCP: [name], MD   Brief Narrative:  52 year old PMHx CVA, Anxiety, Depression with psychotic symptoms, Sleep paralysis,Multiple sclerosis (HCC), Neuropathic bladder, Neuropathy of the hands & feet, Restless Leg Syndrome  Adenomatous colonic polyps,Internal hemorrhoids Anemia, Asthma, Difficult intubation, GERD, Heart murmur, HTN,Stable angina pectoris, HLD,  Right Ankle Fracture (10/06/2013), Type I Diabetes Mellitus .  [facility]red from [facility] too the ED for altered mental status.The patient quite obtunded presently. This appears to have started earlier today. She has fairly advanced Multiple sclerosis.The  Patient has a neurogenic bkladder and has been subject to UTIs in the past The patient is not ambulatory without help.She does eat normally as she does niot have a feeding tube The patient has her eyes open. Does not respond to my voice. Does withdraw to noxious stimuli.Has a decreased gag reflex.Will not follow commands. When the patient arrived in the ED her indwelling foley bag was empty.The catheter was changed and than the patient had a large output of somewhat bloody urine. Her BP was a little "soft" on presentation but came up to the normal ran ge after 2 liters of IV fluid.her creatinine is 5 (was 1+ less than 1 month ago) Her blood sugar is >400. The patient has dry mucosae and poor skin turgor. Her AG was 20-22. Bicarb was decreased to 17.The patient has a history of type 1 DM.o2 sat is 100% on RA.her urien showed many bacteria, WBCS, large Hb, 2+ PROTEINURIA.    Subjective: 2/21 A/O 3 (does not why). Negative CP, negative SOB, negative abdominal pain. Positive diarrhea. Much more interactive. Does not have MS physician. States was diagnosed in September 2018 and was in and out of hospital therefore never officially had MS physician (verified by her children)      Assessment & Plan:   Active Problems:   Acute encephalopathy   Sepsis due to urinary tract infection (HCC)   At high risk for aspiration   Endotracheally intubated   Pneumonia due to infectious organism   Pressure injury of skin   Acute respiratory failure (HCC)   Acute metabolic encephalopathy -Beginning to resolve  Acute respiratory failure with hypoxia, mechanical ventilation due to poor airway protection, neurological dysfunction -Resolved    Septic shock/ positive Proteus Mirabilis (resistant ampicillin and nitrofurantoin)/VRE UTI. positive Proteus Mirabilis (ampicillin resistant) bacteremia/positive C. [phone] -Spoke with ID Dr Judyann Munson concerning loss of abilitty to administer IV ABX and she recommended the following;  Ceftriaxone-->Bactrium for additional 3 days  Linazolid PO additional 3 days  Continue PO Vancomycin     Chronic diastolic CHF -Echocardiogram consistent with CHF see results below -Strict in and out -Daily weight  Acute renal failure -, suspect obstructive uropathy given nonfunctioning chronic Foley catheter, now replaced; resolving Recent Labs  Lab 04/13/17 1857 04/14/17 0242 04/15/17 0431 04/16/17 0328 04/17/17 0321 04/18/17 0241 04/19/17 0534  CREATININE 4.95* 4.18* 2.50* 2.06* 1.73* 1.34* 1.11*  -Resolving  Hypernatremia -Resolved  Hypokalemia -Potassium goal> 4  Multiple Sclerosis -Severe multiple sclerosis in setting of infection. Currently not on medication. -Patient was supposed to establish care with Dr. Despina Arias [facility] however has not been out of the hospital long enough since diagnosis to establish care. Per Dr. Amada Jupiter who saw patient previously believes patient received at least one dose of rituximab.  -Dr. Amada Jupiter recommended speaking with Dr. Wilford Corner who is more familiar with her case. -Daily NIF/VT: 2/20 -  26 cm H2O/300  Neurogenic bladder -Secondary to MS? -Continue  catheterization -Strict in and out    Anemia of chronic disease and critical illness -Negative sign of acute bleed  -Anemia panel pending -Transfusion hemoglobin <7    Diabetes type 1 uncontrolled with complications  -12/23/2016 Hemoglobin A1c= 11.4 -2/21 Hemoglobin A1c = 10.3  -Lipid panel within ADA guidelines          DVT prophylaxis: Subcutaneous heparin Code Status: Full Family Communication: None Disposition Plan: TBD   Consultants:  [facility]   Procedures/Significant Events:  Continuous EEG 2/15 >> generalized slowing, diffuse encephalopathy no evidence of epileptiform activity or ictal evolution CT head 2/17 > no acute findings 2/20 Echocardiogram :Left ventricle: severe LVH. -LVEF =- 55% to 60%. -Grade 1 diastolic   dysfunction. -Left pleural effusion     I have personally reviewed and interpreted all radiology studies and my findings are as above.  VENTILATOR SETTINGS:    Cultures 2/14 blood positive Proteus Mirabilis (ampicillin resistant) 2/14 urine positive Proteus Mirabilis (resistant ampicillin and nitrofurantoin)/VRE 2/15 tracheal aspirate normal respiratory flora 2/15 GI panel negative 2/17 positive C. difficile antigen/C. difficile toxigenic by PCR     Antimicrobials: Anti-infectives (From admission, onward)   Start     Stop   04/19/17 1708  sulfamethoxazole-trimethoprim (BACTRIM DS,SEPTRA DS) 800-160 MG per tablet 1 tablet     04/22/17 2159   04/19/17 1400  linezolid (ZYVOX) tablet 600 mg     04/22/17 0959   04/18/17 1400  vancomycin (VANCOCIN) 50 mg/mL oral solution 125 mg     04/28/17 1359   04/15/17 1200  linezolid (ZYVOX) IVPB 600 mg  Status:  Discontinued     04/19/17 1311   04/13/17 2100  cefTRIAXone (ROCEPHIN) 2 g in sodium chloride 0.9 % 100 mL IVPB  Status:  Discontinued     04/19/17 1701   04/13/17 1600  ceFEPIme (MAXIPIME) 250 mg in dextrose 5 % 50 mL IVPB  Status:  Discontinued     04/12/17 1623   04/12/17  2100  ceFEPIme (MAXIPIME) 1 g in sodium chloride 0.9 % 100 mL IVPB  Status:  Discontinued     04/13/17 1024   04/12/17 1245  aztreonam (AZACTAM) 2 g in sodium chloride 0.9 % 100 mL IVPB     04/12/17 1327       Devices    LINES / TUBES:      Continuous Infusions: . sodium chloride 75 mL/hr at 04/18/17 2000  . cefTRIAXone (ROCEPHIN)  IV Stopped (04/18/17 2208)  . linezolid (ZYVOX) IV Stopped (04/19/17 0128)     Objective: Vitals:   04/18/17 2305 04/19/17 0029 04/19/17 0654 04/19/17 0804  BP: (!) 181/94 (!) 163/71 137/67   Pulse: 78 82 80   Resp: 19 (!) 21 10   Temp:   97.7 F (36.5 C) 97.9 F (36.6 C)  TempSrc:   Oral   SpO2: 99% 100% 100%   Weight:   169 lb 12.1 oz (77 kg)   Height:        Intake/Output Summary (Last 24 hours) at 04/19/2017 0817 Last data filed at 04/19/2017 0717 Gross per 24 hour  Intake 2808.75 ml  Output 1475 ml  Net 1333.75 ml   Filed Weights   04/17/17 0420 04/18/17 0500 04/19/17 0654  Weight: 157 lb 3 oz (71.3 kg) 154 lb 8.7 oz (70.1 kg) 169 lb 12.1 oz (77 kg)    Physical Exam:  General: A/O 3 (does not know why), No  acute respiratory distress Neck:  Negative scars, masses, torticollis, lymphadenopathy, JVD Lungs: Clear to auscultation bilaterally without wheezes or crackles Cardiovascular: Regular rate and rhythm without murmur gallop or rub normal S1 and S2 Abdomen: negative abdominal pain, nondistended, positive soft, bowel sounds, no rebound, no ascites, no appreciable mass Extremities: No significant cyanosis, clubbing, or edema bilateral lower extremities Skin: Negative rashes, lesions, ulcers Psychiatric:  Negative depression, negative anxiety, negative fatigue, negative mania, positive flat affect Central nervous system:  Cranial nerves II through XII intact, tongue/uvula midline, muscle strength bilateral upper extremity 4/5, bilateral upper extremity sensation intact. LLE sensation decreased/absent, RLE sensation intact.   negative dysarthria, negative expressive aphasia, negative receptive aphasia.  .     Data Reviewed: Care during the described time interval was provided by me .  I have reviewed this patient's available data, including medical history, events of note, physical examination, and all test results as part of my evaluation.   CBC: Recent Labs  Lab 04/12/17 1235 04/13/17 0341 04/14/17 0242 04/15/17 0431 04/16/17 0328 04/17/17 0321 04/18/17 0241 04/19/17 0353  WBC 13.8* 10.4 11.4* 12.3* 9.8 12.1* 12.9* 14.6*  NEUTROABS 11.6* 9.1* 10.1* 10.7*  --   --   --   --   HGB 12.2 10.1* 10.0* 10.7* 10.4* 11.3* 10.0* 10.7*  HCT 37.5 31.6* 29.9* 32.1* 32.4* 35.5* 31.5* 32.2*  MCV 91.7 91.6 89.0 87.9 89.8 91.3 91.0 90.4  PLT 230 188 160 143* 147* 162 202 179   Basic Metabolic Panel: Recent Labs  Lab 04/13/17 0341  04/14/17 0242 04/15/17 0431 04/16/17 0328 04/17/17 0321 04/18/17 0241 04/18/17 1642 04/19/17 0534  NA 141   < > 144 149* 150* 150* 149*  --  139  K 3.8   < > 3.5 3.0* 3.0* 3.0* 3.0* 4.4 4.0  CL 110   < > 113* 110 112* 111 113*  --  106  CO2 18*   < > 16* 22 25 25 25   --  21*  GLUCOSE 163*   < > 255* 93 193* 250* 100*  --  215*  BUN 73*   < > 79* 66* 65* 53* 38*  --  28*  CREATININE 5.13*   < > 4.18* 2.50* 2.06* 1.73* 1.34*  --  1.11*  CALCIUM 9.2   < > 9.0 8.9 8.7* 8.7* 8.4*  --  8.0*  MG 2.5*  --  2.6* 2.3 2.4  --   --  2.0 1.9  PHOS 5.0*  --  5.7* 3.7  --   --   --   --   --    < > = values in this interval not displayed.   GFR: Estimated Creatinine Clearance: 62.2 mL/min (A) (by C-G formula based on SCr of 1.11 mg/dL (H)). Liver Function Tests: Recent Labs  Lab 04/12/17 1235 04/14/17 0242  AST 93* 37  ALT 86* 50  ALKPHOS 89 100  BILITOT 1.3* 0.6  PROT 7.0 5.9*  ALBUMIN 3.0* 2.0*   No results for input(s): LIPASE, AMYLASE in the last 168 hours. Recent Labs  Lab 04/16/17 1029  AMMONIA 18   Coagulation Profile: Recent Labs  Lab 04/12/17 1519  04/14/17 0242  INR 1.53 1.24   Cardiac Enzymes: Recent Labs  Lab 04/12/17 2047 04/13/17 0430 04/14/17 0242 04/15/17 0431  CKTOTAL 3,191* 2,304* 1,180* 930*   BNP (last 3 results) No results for input(s): PROBNP in the last 8760 hours. HbA1C: No results for input(s): HGBA1C in the last 72 hours. CBG: Recent Labs  Lab 04/18/17 2352 04/19/17 0108 04/19/17 0415 04/19/17 0441 04/19/17 0803  GLUCAP 53* 76 208* 209* 192*   Lipid Profile: Recent Labs    04/19/17 0534  CHOL 85  HDL 26*  LDLCALC 27  TRIG 119*  CHOLHDL 3.3   Thyroid Function Tests: No results for input(s): TSH, T4TOTAL, FREET4, T3FREE, THYROIDAB in the last 72 hours. Anemia Panel: No results for input(s): VITAMINB12, FOLATE, FERRITIN, TIBC, IRON, RETICCTPCT in the last 72 hours. Urine analysis:    Component Value Date/Time   COLORURINE AMBER (A) 04/12/2017 1346   APPEARANCEUR CLOUDY (A) 04/12/2017 1346   LABSPEC 1.011 04/12/2017 1346   PHURINE 8.0 04/12/2017 1346   GLUCOSEU NEGATIVE 04/12/2017 1346   HGBUR LARGE (A) 04/12/2017 1346   BILIRUBINUR NEGATIVE 04/12/2017 1346   BILIRUBINUR neg 11/07/2016 1158   KETONESUR NEGATIVE 04/12/2017 1346   PROTEINUR 100 (A) 04/12/2017 1346   UROBILINOGEN 0.2 11/07/2016 1158   UROBILINOGEN 0.2 04/10/2007 0855   NITRITE NEGATIVE 04/12/2017 1346   LEUKOCYTESUR LARGE (A) 04/12/2017 1346   Sepsis Labs: @LABRCNTIP (procalcitonin:4,lacticidven:4)  ) Recent Results (from the past 240 hour(s))  Blood Culture (routine x 2)     Status: Abnormal   Collection Time: 04/12/17 12:50 PM  Result Value Ref Range Status   Specimen Description BLOOD LEFT ANTECUBITAL  Final   Special Requests   Final    BOTTLES DRAWN AEROBIC AND ANAEROBIC Blood Culture adequate volume   Culture  Setup Time   Final    GRAM NEGATIVE RODS IN BOTH AEROBIC AND ANAEROBIC BOTTLES CRITICAL RESULT CALLED TO, READ BACK BY AND VERIFIED WITH: Lieutenant Diego [id number] MLM Performed at [facility], [address] [place], [place] [address]    Culture PROTEUS MIRABILIS (A)  Final   Report Status 04/15/2017 FINAL  Final   Organism ID, Bacteria PROTEUS MIRABILIS  Final      Susceptibility   Proteus mirabilis - MIC*    AMPICILLIN >=32 RESISTANT Resistant     CEFAZOLIN <=4 SENSITIVE Sensitive     CEFEPIME <=1 SENSITIVE Sensitive     CEFTAZIDIME <=1 SENSITIVE Sensitive     CEFTRIAXONE <=1 SENSITIVE Sensitive     CIPROFLOXACIN <=0.25 SENSITIVE Sensitive     GENTAMICIN <=1 SENSITIVE Sensitive     IMIPENEM 2 SENSITIVE Sensitive     TRIMETH/SULFA <=20 SENSITIVE Sensitive     AMPICILLIN/SULBACTAM 8 SENSITIVE Sensitive     PIP/TAZO <=4 SENSITIVE Sensitive     * PROTEUS MIRABILIS  Blood Culture ID Panel (Reflexed)     Status: Abnormal   Collection Time: 04/12/17 12:50 PM  Result Value Ref Range Status   Enterococcus species NOT DETECTED NOT DETECTED Final   Listeria monocytogenes NOT DETECTED NOT DETECTED Final   Staphylococcus species NOT DETECTED NOT DETECTED Final   Staphylococcus aureus NOT DETECTED NOT DETECTED Final   Streptococcus species NOT DETECTED NOT DETECTED Final   Streptococcus agalactiae NOT DETECTED NOT DETECTED Final   Streptococcus pneumoniae NOT DETECTED NOT DETECTED Final   Streptococcus pyogenes NOT DETECTED NOT DETECTED Final   Acinetobacter baumannii NOT DETECTED NOT DETECTED Final   Enterobacteriaceae species DETECTED (A) NOT DETECTED Final    Comment: Enterobacteriaceae represent a large family of gram-negative bacteria, not a single organism. CRITICAL RESULT CALLED TO, READ BACK BY AND VERIFIED WITH: PHARMD G ABBOTT [id number] MLM    Enterobacter cloacae complex NOT DETECTED NOT DETECTED Final   Escherichia coli NOT DETECTED NOT DETECTED Final   Klebsiella oxytoca NOT DETECTED  NOT DETECTED Final   Klebsiella pneumoniae NOT DETECTED NOT DETECTED Final   Proteus species DETECTED (A) NOT DETECTED Final    Comment: CRITICAL RESULT CALLED TO, READ  BACK BY AND VERIFIED WITH: PHARMD G ABBOTT [id number] MLM    Serratia marcescens NOT DETECTED NOT DETECTED Final   Carbapenem resistance NOT DETECTED NOT DETECTED Final   Haemophilus influenzae NOT DETECTED NOT DETECTED Final   Neisseria meningitidis NOT DETECTED NOT DETECTED Final   Pseudomonas aeruginosa NOT DETECTED NOT DETECTED Final   Candida albicans NOT DETECTED NOT DETECTED Final   Candida glabrata NOT DETECTED NOT DETECTED Final   Candida krusei NOT DETECTED NOT DETECTED Final   Candida parapsilosis NOT DETECTED NOT DETECTED Final   Candida tropicalis NOT DETECTED NOT DETECTED Final    Comment: Performed at [facility], [address], [place], [place] [address]  Blood Culture (routine x 2)     Status: Abnormal   Collection Time: 04/12/17 12:52 PM  Result Value Ref Range Status   Specimen Description BLOOD LEFT FOREARM  Final   Special Requests IN PEDIATRIC BOTTLE Blood Culture adequate volume  Final   Culture  Setup Time   Final    GRAM NEGATIVE RODS IN PEDIATRIC BOTTLE CRITICAL VALUE NOTED.  VALUE IS CONSISTENT WITH PREVIOUSLY REPORTED AND CALLED VALUE.    Culture (A)  Final    PROTEUS MIRABILIS SUSCEPTIBILITIES PERFORMED ON PREVIOUS CULTURE WITHIN THE LAST 5 DAYS. Performed at [facility], [address], [place], [place] [address]    Report Status 04/15/2017 FINAL  Final  Urine culture     Status: Abnormal   Collection Time: 04/12/17  1:46 PM  Result Value Ref Range Status   Specimen Description URINE, RANDOM  Final   Special Requests   Final    NONE Performed at [facility], [address], [place], [place] [address]    Culture (A)  Final    >=100,000 COLONIES/mL PROTEUS MIRABILIS >=100,000 COLONIES/mL VANCOMYCIN RESISTANT ENTEROCOCCUS    Report Status 04/15/2017 FINAL  Final   Organism ID, Bacteria PROTEUS MIRABILIS (A)  Final   Organism ID, Bacteria VANCOMYCIN RESISTANT ENTEROCOCCUS (A)  Final      Susceptibility   Proteus mirabilis  - MIC*    AMPICILLIN >=32 RESISTANT Resistant     CEFAZOLIN <=4 SENSITIVE Sensitive     CEFTRIAXONE <=1 SENSITIVE Sensitive     CIPROFLOXACIN <=0.25 SENSITIVE Sensitive     GENTAMICIN <=1 SENSITIVE Sensitive     IMIPENEM 4 SENSITIVE Sensitive     NITROFURANTOIN 256 RESISTANT Resistant     TRIMETH/SULFA <=20 SENSITIVE Sensitive     AMPICILLIN/SULBACTAM 8 SENSITIVE Sensitive     PIP/TAZO <=4 SENSITIVE Sensitive     * >=100,000 COLONIES/mL PROTEUS MIRABILIS   Vancomycin resistant enterococcus - MIC*    AMPICILLIN <=2 SENSITIVE Sensitive     LEVOFLOXACIN >=8 RESISTANT Resistant     NITROFURANTOIN <=16 SENSITIVE Sensitive     VANCOMYCIN >=32 RESISTANT Resistant     LINEZOLID 2 SENSITIVE Sensitive     * >=100,000 COLONIES/mL VANCOMYCIN RESISTANT ENTEROCOCCUS  MRSA PCR Screening     Status: None   Collection Time: 04/12/17  9:51 PM  Result Value Ref Range Status   MRSA by PCR NEGATIVE NEGATIVE Final    Comment:        The GeneXpert MRSA Assay (FDA approved for NASAL specimens only), is one component of a comprehensive MRSA colonization surveillance program. It is not intended  to diagnose MRSA infection nor to guide or monitor treatment for MRSA infections. Performed at [facility], [address] [place], [place] [address]   Culture, respiratory (NON-Expectorated)     Status: None   Collection Time: 04/13/17  5:14 AM  Result Value Ref Range Status   Specimen Description TRACHEAL ASPIRATE  Final   Special Requests NONE  Final   Gram Stain   Final    FEW WBC PRESENT, PREDOMINANTLY PMN MODERATE GRAM POSITIVE COCCI IN PAIRS MODERATE GRAM NEGATIVE RODS FEW GRAM POSITIVE RODS    Culture   Final    MODERATE Consistent with normal respiratory flora. Performed at [facility], [address], [place], [place] [address]    Report Status 04/15/2017 FINAL  Final  Gastrointestinal Panel by PCR , Stool     Status: None   Collection Time: 04/13/17  5:45 AM  Result Value  Ref Range Status   Campylobacter species NOT DETECTED NOT DETECTED Final   Plesimonas shigelloides NOT DETECTED NOT DETECTED Final   Salmonella species NOT DETECTED NOT DETECTED Final   Yersinia enterocolitica NOT DETECTED NOT DETECTED Final   Vibrio species NOT DETECTED NOT DETECTED Final   Vibrio cholerae NOT DETECTED NOT DETECTED Final   Enteroaggregative E coli (EAEC) NOT DETECTED NOT DETECTED Final   Enteropathogenic E coli (EPEC) NOT DETECTED NOT DETECTED Final   Enterotoxigenic E coli (ETEC) NOT DETECTED NOT DETECTED Final   Shiga like toxin producing E coli (STEC) NOT DETECTED NOT DETECTED Final   Shigella/Enteroinvasive E coli (EIEC) NOT DETECTED NOT DETECTED Final   Cryptosporidium NOT DETECTED NOT DETECTED Final   Cyclospora cayetanensis NOT DETECTED NOT DETECTED Final   Entamoeba histolytica NOT DETECTED NOT DETECTED Final   Giardia lamblia NOT DETECTED NOT DETECTED Final   Adenovirus F40/41 NOT DETECTED NOT DETECTED Final   Astrovirus NOT DETECTED NOT DETECTED Final   Norovirus GI/GII NOT DETECTED NOT DETECTED Final   Rotavirus A NOT DETECTED NOT DETECTED Final   Sapovirus (I, II, IV, and V) NOT DETECTED NOT DETECTED Final    Comment: Performed at [facility], [address] [place], [place] [address]  C difficile quick scan w PCR reflex     Status: Abnormal   Collection Time: 04/15/17  9:31 PM  Result Value Ref Range Status   C Diff antigen POSITIVE (A) NEGATIVE Final   C Diff toxin NEGATIVE NEGATIVE Final   C Diff interpretation Results are indeterminate. See PCR results.  Final    Comment: Performed at [facility], [address] [place], [place] [address]  C. Diff by PCR, Reflexed     Status: Abnormal   Collection Time: 04/15/17  9:31 PM  Result Value Ref Range Status   Toxigenic C. Difficile by PCR POSITIVE (A) NEGATIVE Final    Comment: Positive for toxigenic C. difficile with little to no toxin production. Only treat if clinical presentation  suggests symptomatic illness.         Radiology Studies: No results found.      Scheduled Meds: . heparin  5,000 Units Subcutaneous Q8H  . insulin aspart  0-15 Units Subcutaneous Q4H  . insulin glargine  35 Units Subcutaneous Daily  . mouth rinse  15 mL Mouth Rinse QID  . pneumococcal 23 valent vaccine  0.5 mL Intramuscular Tomorrow-1000  . vancomycin  125 mg Oral QID   Continuous Infusions: . sodium chloride 75 mL/hr at 04/18/17 2000  . cefTRIAXone (ROCEPHIN)  IV  Stopped (04/18/17 2208)  . linezolid (ZYVOX) IV Stopped (04/19/17 0128)     LOS: 7 days    Time spent: 40 minutes    Cordae Mccarey, Roselind Messier, MD [facility] Pager [phone]   If 7PM-7AM, please contact night-coverage [email or web address] Password [facility] 04/19/2017, 8:17 AM

## 2017-04-19 NOTE — Care Management Note (Addendum)
Case Management Note  Patient Details  Name: Debra [name] MRN: [id number] Date of Birth: [birth year]  Subjective/Objective:     Pt from [facility] SNF admitted with acute encephalopathy, sepsis/UTI. Hx of  CVA, Anxiety, Depression, Sleep paralysis,Multiple sclerosis, Neuropathic bladder, Neuropathy of the hands &feet, Restless Leg Syndrome, Adenomatous colonic polyps,Internal hemorrhoids Anemia, Asthma, Difficult intubation, GERD, Heart murmur, HTN,Stable angina pectoris, HLD,  Right Ankle Fracture (10/06/2013), Type I Diabetes Mellitus .      [name] [address]) Bethanie Rumple (Daughter) Diezal(son)   [phone]    PCP: [name]  Action/Plan:  Transition back to SNF when medically stable... Referral made to CSW.....CM following as need presents....  Expected Discharge Date:                  Expected Discharge Plan:  Skilled Nursing Facility  In-House Referral:  Clinical Social Work  Discharge planning Services  CM Consult  Post Acute Care Choice:    Choice offered to:     DME Arranged:    DME Agency:     HH Arranged:    HH Agency:     Status of Service:  In process, will continue to follow  If discussed at Long Length of Stay Meetings, dates discussed:    Additional Comments:  Epifanio Lesches, RN 04/19/2017, 8:39 PM

## 2017-04-19 NOTE — Progress Notes (Signed)
Pt stated to PT & OT that her BP can drop when standing or getting OOB into chair. PT & OT both requested an abdominal binder & thigh high TED hose for use in next therapy session. Orders placed.

## 2017-04-19 NOTE — Progress Notes (Signed)
Inpatient Diabetes Program Recommendations  AACE/ADA: New Consensus Statement on Inpatient Glycemic Control (2015)  Target Ranges:  Prepandial:   less than 140 mg/dL      Peak postprandial:   less than 180 mg/dL (1-2 hours)      Critically ill patients:  140 - 180 mg/dL   Lab Results  Component Value Date   GLUCAP 179 (H) 04/19/2017   HGBA1C 10.3 (H) 04/19/2017    Review of Glycemic Control Results for Debra [name], Debra [name] (MRN [id number]) as of 04/19/2017 14:32  Ref. Range 04/19/2017 01:08 04/19/2017 04:15 04/19/2017 04:41 04/19/2017 08:03 04/19/2017 12:23  Glucose-Capillary Latest Ref Range: 65 - 99 mg/dL 76 438 (H) 887 (H) 579 (H) 179 (H)   Inpatient Diabetes Program Recommendations:    Noted patient ordered diet to begin. Please consider: -Change Novolog correction to sensitive tid + hs 0-5 units  Thank you, Darel Hong E. Mitsugi Schrader, RN, MSN, CDE  Diabetes Coordinator Inpatient Glycemic Control Team Team Pager [phone] (8am-5pm) 04/19/2017 2:34 PM

## 2017-04-19 NOTE — Progress Notes (Signed)
  Speech Language Pathology Treatment: Dysphagia  Patient Details Name: Brian Kocourek MRN: [id number] DOB: 1965/07/22 Today's Date: 04/19/2017 Time: 1350-1405 SLP Time Calculation (min) (ACUTE ONLY): 15 min  Assessment / Plan / Recommendation Clinical Impression  Pt demonstrates clear vocal quality when adequate breath support applied to phonation. Pt demonstrates baseline congested coughing without adequate force to clear upper airway congestion. Despite this, thin liuids consumed consecutively were tolerated without immediate cough or wet vocal quality. Pt also demonstrated ability to masticate regular solids, no prolonged mastication or expectoration as seen in prior session. Pt typically consumes foods of choice. Will upgrade diet to regular thin and sign off.   HPI        SLP Plan  All goals met       Recommendations  Diet recommendations: Regular;Thin liquid Liquids provided via: Cup;Straw Medication Administration: Whole meds with liquid Supervision: Patient able to self feed Compensations: Slow rate;Small sips/bites Postural Changes and/or Swallow Maneuvers: Seated upright 90 degrees                Oral Care Recommendations: Oral care BID Follow up Recommendations: Skilled Nursing facility Plan: All goals met       GO                German Manke, Katherene Ponto 04/19/2017, 2:08 PM

## 2017-04-19 NOTE — Progress Notes (Signed)
Nutrition Follow-up  DOCUMENTATION CODES:   Not applicable  INTERVENTION:  Premier Protein BID, each supplement provides 160 calories and 30gm of protein  NUTRITION DIAGNOSIS:   Inadequate oral intake related to inability to eat as evidenced by NPO status. -resolved  GOAL:   Patient will meet greater than or equal to 90% of their needs -unmet  MONITOR:   PO intake, I & O's, Labs, Supplement acceptance, Weight trends  ASSESSMENT:   52 yo female with PMH of multiple sclerosis, neurogenic bladder, HLD, stroke, neuropathy, DM-1, HTN, and GERD, who was admitted on 2/14 from her nursing facility with AMS. Required intubation early morning of 2/15.  Patient was on NDD1 during visit, does not like requested RD change diet. Explained to her speech pathology determines safety of oral intake and appropriate diet. Paged speech. Diet now upgraded. RD will monitor PO intake.  Septic shock positive for proteus mirabilis being treated. Diarrhea.  Labs reviewed:  CBGs 179, 192, 209 Medications reviewed and include:  Insulin  Diet Order:  Diet regular Room service appropriate? Yes; Fluid consistency: Thin  EDUCATION NEEDS:   No education needs have been identified at this time  Skin:  Skin Assessment: Reviewed RN Assessment  Last BM:  04/19/2017  Height:   Ht Readings from Last 1 Encounters:  04/16/17 5\' 5"  (1.651 m)    Weight:   Wt Readings from Last 1 Encounters:  04/19/17 169 lb 12.1 oz (77 kg)    Ideal Body Weight:  56.8 kg  BMI:  Body mass index is 28.25 kg/m.  Estimated Nutritional Needs:   Kcal:  1650  Protein:  90-100 gm  Fluid:  1.7 L  Dionne Ano. Josep Luviano, MS, RD LDN Inpatient Clinical Dietitian Pager [phone]

## 2017-04-20 DIAGNOSIS — A491 Streptococcal infection, unspecified site: Secondary | ICD-10-CM

## 2017-04-20 DIAGNOSIS — Z1621 Resistance to vancomycin: Secondary | ICD-10-CM

## 2017-04-20 DIAGNOSIS — T827XXA Infection and inflammatory reaction due to other cardiac and vascular devices, implants and grafts, initial encounter: Secondary | ICD-10-CM

## 2017-04-20 DIAGNOSIS — I5032 Chronic diastolic (congestive) heart failure: Secondary | ICD-10-CM

## 2017-04-20 LAB — CBC WITH DIFFERENTIAL/PLATELET
BASOS ABS: 0 10*3/uL (ref 0.0–0.1)
BASOS PCT: 0 %
EOS PCT: 4 %
Eosinophils Absolute: 0.5 10*3/uL (ref 0.0–0.7)
HCT: 30.2 % — ABNORMAL LOW (ref 36.0–46.0)
Hemoglobin: 9.8 g/dL — ABNORMAL LOW (ref 12.0–15.0)
LYMPHS PCT: 11 %
Lymphs Abs: 1.5 10*3/uL (ref 0.7–4.0)
MCH: 29.3 pg (ref 26.0–34.0)
MCHC: 32.5 g/dL (ref 30.0–36.0)
MCV: 90.1 fL (ref 78.0–100.0)
Monocytes Absolute: 0.4 10*3/uL (ref 0.1–1.0)
Monocytes Relative: 3 %
Neutro Abs: 11.4 10*3/uL — ABNORMAL HIGH (ref 1.7–7.7)
Neutrophils Relative %: 82 %
Platelets: 259 10*3/uL (ref 150–400)
RBC: 3.35 MIL/uL — ABNORMAL LOW (ref 3.87–5.11)
RDW: 15.3 % (ref 11.5–15.5)
WBC: 13.9 10*3/uL — ABNORMAL HIGH (ref 4.0–10.5)

## 2017-04-20 LAB — BASIC METABOLIC PANEL
Anion gap: 10 (ref 5–15)
BUN: 16 mg/dL (ref 6–20)
CALCIUM: 8.3 mg/dL — AB (ref 8.9–10.3)
CO2: 23 mmol/L (ref 22–32)
Chloride: 106 mmol/L (ref 101–111)
Creatinine, Ser: 1.12 mg/dL — ABNORMAL HIGH (ref 0.44–1.00)
GFR calc Af Amer: >60 mL/min (ref >60)
GFR, EST NON AFRICAN AMERICAN: 56 mL/min — AB (ref >60)
GLUCOSE: 68 mg/dL (ref 65–99)
Potassium: 3.5 mmol/L (ref 3.5–5.1)
Sodium: 139 mmol/L (ref 135–145)

## 2017-04-20 LAB — FOLATE: Folate: 13.8 ng/mL (ref >5.9)

## 2017-04-20 LAB — GLUCOSE, CAPILLARY
GLUCOSE-CAPILLARY: 54 mg/dL — AB (ref 65–99)
GLUCOSE-CAPILLARY: 186 mg/dL — AB (ref 65–99)
Glucose-Capillary: 55 mg/dL — ABNORMAL LOW (ref 65–99)
Glucose-Capillary: 113 mg/dL — ABNORMAL HIGH (ref 65–99)
Glucose-Capillary: 98 mg/dL (ref 65–99)
Glucose-Capillary: 37 mg/dL — CL (ref 65–99)
Glucose-Capillary: 73 mg/dL (ref 65–99)
Glucose-Capillary: 173 mg/dL — ABNORMAL HIGH (ref 65–99)

## 2017-04-20 LAB — IRON AND TIBC
IRON: 27 ug/dL — AB (ref 28–170)
Saturation Ratios: 17 % (ref 10.4–31.8)
TIBC: 162 ug/dL — ABNORMAL LOW (ref 250–450)
UIBC: 135 ug/dL

## 2017-04-20 LAB — MAGNESIUM: MAGNESIUM: 1.9 mg/dL (ref 1.7–2.4)

## 2017-04-20 LAB — VITAMIN B12: Vitamin B-12: 1449 pg/mL — ABNORMAL HIGH (ref 180–914)

## 2017-04-20 LAB — FERRITIN: FERRITIN: 216 ng/mL (ref 11–307)

## 2017-04-20 LAB — RETICULOCYTES
RBC.: 3.35 MIL/uL — ABNORMAL LOW (ref 3.87–5.11)
RETIC COUNT ABSOLUTE: 36.9 10*3/uL (ref 19.0–186.0)
RETIC CT PCT: 1.1 % (ref 0.4–3.1)

## 2017-04-20 MED ORDER — ACETAMINOPHEN 325 MG PO TABS
650.0000 mg | ORAL_TABLET | Freq: Four times a day (QID) | ORAL | Status: DC | PRN
  Administered 2017-04-20: 650 mg via ORAL
  Filled 2017-04-20: qty 2

## 2017-04-20 MED ORDER — TRAMADOL HCL 50 MG PO TABS
50.0000 mg | ORAL_TABLET | Freq: Four times a day (QID) | ORAL | Status: DC | PRN
  Administered 2017-04-21 (×2): 50 mg via ORAL
  Filled 2017-04-20 (×2): qty 1

## 2017-04-20 NOTE — Progress Notes (Signed)
Inpatient Diabetes Program Recommendations  AACE/ADA: New Consensus Statement on Inpatient Glycemic Control (2015)  Target Ranges:  Prepandial:   less than 140 mg/dL      Peak postprandial:   less than 180 mg/dL (1-2 hours)      Critically ill patients:  140 - 180 mg/dL   Results for ALINAH, NIENOW (MRN [id number]) as of 04/20/2017 08:29  Ref. Range 04/19/2017 08:03 04/19/2017 12:23 04/19/2017 17:08 04/19/2017 19:59 04/19/2017 22:32 04/19/2017 23:59 04/20/2017 03:01 04/20/2017 04:07  Glucose-Capillary Latest Ref Range: 65 - 99 mg/dL 935 (H) 701 (H) 80 40 (LL) 132 (H) 115 (H) 55 (L) 113 (H)   Review of Glycemic Control  Inpatient Diabetes Program Recommendations:    Renal function elevated and patient had hypoglycemia x2 over the past 24 hours. -Change Novolog correction to sensitive tid + hs 0-5 units  Thanks,  Christena Deem RN, MSN, BC-ADM, [facility] Inpatient Diabetes Coordinator Team Pager [phone] (8a-5p)

## 2017-04-20 NOTE — Progress Notes (Addendum)
Physical Therapy Treatment Patient Details Name: Debra [name] MRN: [id number] DOB: October 25, 1965 Today's Date: 04/20/2017    History of Present Illness 52 year old woman with a past medical history of multiple sclerosis was very disabled at baseline, neurogenic bladder with chronic Foley admitted with septic shock with gram-negative organisms and was uremic that progressed to acute renal failure had acute respiratory failure requiring mechanical ventilation, being followed by neurology for altered mental status.    PT Comments    Pt very motivated this session. Requesting to attempt standing with RW. Pt's LEs unable to support pt at this time. Was instead transferred via lateral scoot to recliner chair. Pt required max A +2 for lateral scoot. D/C updated to SNF as pt came from SNF and will have no family support upon d/c.    Follow Up Recommendations  SNF     Equipment Recommendations  Other (comment)(TBA)    Recommendations for Other Services       Precautions / Restrictions Precautions Precautions: Fall Precaution Comments: patient reports has issues with orthostatic hypotension Restrictions Weight Bearing Restrictions: No    Mobility  Bed Mobility               General bed mobility comments: Pt sitting EOB on arrival  Transfers Overall transfer level: Needs assistance Equipment used: Rolling walker (2 wheeled) Transfers: Sit to/from Stand;Lateral/Scoot Transfers Sit to Stand: Total assist;+2 safety/equipment;+2 physical assistance;From elevated surface        Lateral/Scoot Transfers: Max assist;+2 physical assistance;+2 safety/equipment General transfer comment: Pt requesting to attempt standing with RW. Trialed 1x, pt's LEs un able to power up or support pt's weight. Pt returned to seated EOB and performed lateral scoot to recliner chair with max A +2.  Ambulation/Gait                 Stairs            Wheelchair Mobility    Modified  Rankin (Stroke Patients Only)       Balance                                            Cognition Arousal/Alertness: Awake/alert Behavior During Therapy: WFL for tasks assessed/performed Overall Cognitive Status: Within Functional Limits for tasks assessed                                        Exercises      General Comments        Pertinent Vitals/Pain Pain Assessment: Faces Faces Pain Scale: Hurts little more Pain Location: hip and shoulder pain with movement Pain Descriptors / Indicators: Aching;Grimacing Pain Intervention(s): Monitored during session;Limited activity within patient's tolerance;Repositioned    Home Living                      Prior Function            PT Goals (current goals can now be found in the care plan section) Acute Rehab PT Goals Patient Stated Goal: " to walk again" PT Goal Formulation: With patient Time For Goal Achievement: 05/03/17 Potential to Achieve Goals: Fair Progress towards PT goals: Progressing toward goals    Frequency    Min 3X/week      PT Plan Discharge plan needs to be  updated    Co-evaluation              AM-PAC PT "6 Clicks" Daily Activity  Outcome Measure  Difficulty turning over in bed (including adjusting bedclothes, sheets and blankets)?: Unable Difficulty moving from lying on back to sitting on the side of the bed? : Unable Difficulty sitting down on and standing up from a chair with arms (e.g., wheelchair, bedside commode, etc,.)?: Unable Help needed moving to and from a bed to chair (including a wheelchair)?: Total Help needed walking in hospital room?: Total Help needed climbing 3-5 steps with a railing? : Total 6 Click Score: 6    End of Session Equipment Utilized During Treatment: Gait belt Activity Tolerance: Patient tolerated treatment well Patient left: with call bell/phone within reach;in chair Nurse Communication: Mobility status PT  Visit Diagnosis: Muscle weakness (generalized) (M62.81)     Time: 1740-8144 PT Time Calculation (min) (ACUTE ONLY): 23 min  Charges:  $Therapeutic Activity: 23-37 mins                    G CodesKallie Locks, [place] Pager [phone] Acute Rehab   Sheral Apley 04/20/2017, 11:34 AM

## 2017-04-20 NOTE — Progress Notes (Signed)
NIF -20  Good patient effort

## 2017-04-20 NOTE — Progress Notes (Signed)
NIF -25

## 2017-04-20 NOTE — Progress Notes (Addendum)
PROGRESS NOTE    Debra [name]  ZOX:[id number] DOB: 1965/05/08 DOA: 04/12/2017 PCP: [name], MD   Brief Narrative:  52 year old PMHx CVA, Anxiety, Depression with psychotic symptoms, Sleep paralysis,Multiple sclerosis (HCC), Neuropathic bladder, Neuropathy of the hands & feet, Restless Leg Syndrome  Adenomatous colonic polyps,Internal hemorrhoids Anemia, Asthma, Difficult intubation, GERD, Heart murmur, HTN,Stable angina pectoris, HLD,  Right Ankle Fracture (10/06/2013), Type I Diabetes Mellitus .  [facility]red [facility] too the ED for altered mental status.The patient quite obtunded presently. This appears to have started earlier today. She has fairly advanced Multiple sclerosis.The  Patient has a neurogenic bkladder and has been subject to UTIs in the past The patient is not ambulatory without help.She does eat normally as she does niot have a feeding tube The patient has her eyes open. Does not respond to my voice. Does withdraw to noxious stimuli.Has a decreased gag reflex.Will not follow commands. When the patient arrived in the ED her indwelling foley bag was empty.The catheter was changed and than the patient had a large output of somewhat bloody urine. Her BP was a little "soft" on presentation but came up to the normal ran ge after 2 liters of IV fluid.her creatinine is 5 (was 1+ less than 1 month ago) Her blood sugar is >400. The patient has dry mucosae and poor skin turgor. Her AG was 20-22. Bicarb was decreased to 17.The patient has a history of type 1 DM.o2 sat is 100% on RA.her urien showed many bacteria, WBCS, large Hb, 2+ PROTEINURIA.    Subjective: 2/22  A/O 4, negative CP, negative SOB, negative abdominal pain. Positive diarrhea. Biggest complaint is positive bilateral hip pain. Much more awake and interactive.     Assessment & Plan:   Active Problems:   Acute encephalopathy   Sepsis due to urinary tract infection (HCC)   At high  risk for aspiration   Endotracheally intubated   Pneumonia due to infectious organism   Pressure injury of skin   Acute respiratory failure (HCC)   Acute metabolic encephalopathy -Beginning to resolve  Acute respiratory failure with hypoxia, mechanical ventilation due to poor airway protection, neurological dysfunction -Resolved    Septic shock/ positive Proteus Mirabilis (resistant ampicillin and nitrofurantoin)/VRE UTI. positive Proteus Mirabilis (ampicillin resistant) bacteremia/positive C. [phone] -Spoke with ID Dr Judyann Munson concerning loss of abilitty to administer IV ABX and she recommended the following;  Ceftriaxone-->Bactrium for additional 3 days  Linazolid PO additional 3 days  Continue PO Vancomycin     Chronic diastolic CHF -Echocardiogram consistent with CHF see results below -Strict in and out -Daily weight Filed Weights   04/18/17 0500 04/19/17 0654 04/20/17 0452  Weight: 154 lb 8.7 oz (70.1 kg) 169 lb 12.1 oz (77 kg) 163 lb 12.8 oz (74.3 kg)    Acute renal failure -, suspect obstructive uropathy given nonfunctioning chronic Foley catheter, now replaced; resolving Recent Labs  Lab 04/14/17 0242 04/15/17 0431 04/16/17 0328 04/17/17 0321 04/18/17 0241 04/19/17 0534 04/20/17 0736  CREATININE 4.18* 2.50* 2.06* 1.73* 1.34* 1.11* 1.12*  -Resolving  Hypernatremia -Resolved  Hypokalemia -Potassium goal> 4  Multiple Sclerosis -Severe multiple sclerosis in setting of infection. Currently not on medication. -Patient was supposed to establish care with Dr. Despina Arias [facility] however has not been out of the hospital long enough since diagnosis to establish care. Per Dr. Amada Jupiter who saw patient previously believes patient received at least one dose of rituximab.  -Dr. Amada Jupiter recommended speaking with Dr.  Wilford Corner who is more familiar with her case. -Daily NIF/VT: 2/22 -25 cmH 2O/??   Neurogenic bladder -Secondary to  MS? -Continue catheterization -Strict in and out    Anemia of chronic disease and critical illness -Negative sign of acute bleed  -Anemia panel consistent with anemia of chronic disease. -Transfusion hemoglobin <7    Diabetes type 1 uncontrolled with complications  -12/23/2016 Hemoglobin A1c= 11.4 -2/21 Hemoglobin A1c = 10.3  -Lipid panel within ADA guidelines          DVT prophylaxis: Subcutaneous heparin Code Status: Full Family Communication: None Disposition Plan: TBD   Consultants:  [facility]   Procedures/Significant Events:  Continuous EEG 2/15 >> generalized slowing, diffuse encephalopathy no evidence of epileptiform activity or ictal evolution CT head 2/17 > no acute findings 2/20 Echocardiogram :Left ventricle: severe LVH. -LVEF =- 55% to 60%. -Grade 1 diastolic   dysfunction. -Left pleural effusion     I have personally reviewed and interpreted all radiology studies and my findings are as above.  VENTILATOR SETTINGS:    Cultures 2/14 blood positive Proteus Mirabilis (ampicillin resistant) 2/14 urine positive Proteus Mirabilis (resistant ampicillin and nitrofurantoin)/VRE 2/15 tracheal aspirate normal respiratory flora 2/15 GI panel negative 2/17 positive C. difficile antigen/C. difficile toxigenic by PCR     Antimicrobials: Anti-infectives (From admission, onward)   Start     Stop   04/19/17 1708  sulfamethoxazole-trimethoprim (BACTRIM DS,SEPTRA DS) 800-160 MG per tablet 1 tablet     04/22/17 2159   04/19/17 1400  linezolid (ZYVOX) tablet 600 mg     04/22/17 0959   04/18/17 1400  vancomycin (VANCOCIN) 50 mg/mL oral solution 125 mg     04/28/17 1359   04/15/17 1200  linezolid (ZYVOX) IVPB 600 mg  Status:  Discontinued     04/19/17 1311   04/13/17 2100  cefTRIAXone (ROCEPHIN) 2 g in sodium chloride 0.9 % 100 mL IVPB  Status:  Discontinued     04/19/17 1701   04/13/17 1600  ceFEPIme (MAXIPIME) 250 mg in dextrose 5 % 50 mL IVPB   Status:  Discontinued     04/12/17 1623   04/12/17 2100  ceFEPIme (MAXIPIME) 1 g in sodium chloride 0.9 % 100 mL IVPB  Status:  Discontinued     04/13/17 1024   04/12/17 1245  aztreonam (AZACTAM) 2 g in sodium chloride 0.9 % 100 mL IVPB     04/12/17 1327       Devices    LINES / TUBES:      Continuous Infusions:    Objective: Vitals:   04/20/17 0143 04/20/17 0451 04/20/17 0452 04/20/17 0745  BP: 137/76   (!) 158/81  Pulse: 78   100  Resp: 17   18  Temp: 98 F (36.7 C) 98 F (36.7 C)  97.8 F (36.6 C)  TempSrc: Oral Axillary  Oral  SpO2: 100%   99%  Weight:   163 lb 12.8 oz (74.3 kg)   Height:        Intake/Output Summary (Last 24 hours) at 04/20/2017 1525 Last data filed at 04/20/2017 1042 Gross per 24 hour  Intake 320 ml  Output 1850 ml  Net -1530 ml   Filed Weights   04/18/17 0500 04/19/17 0654 04/20/17 0452  Weight: 154 lb 8.7 oz (70.1 kg) 169 lb 12.1 oz (77 kg) 163 lb 12.8 oz (74.3 kg)    Physical Exam:  General: A/O 4, No acute respiratory distress Neck:  Negative scars, masses, torticollis, lymphadenopathy, JVD Lungs:  Clear to auscultation bilaterally without wheezes or crackles Cardiovascular: Regular rate and rhythm without murmur gallop or rub normal S1 and S2 Abdomen: negative abdominal pain, nondistended, positive soft, bowel sounds, no rebound, no ascites, no appreciable mass Extremities: No significant cyanosis, clubbing, or edema bilateral lower extremities Skin: Negative rashes, lesions, ulcers Psychiatric:  Negative depression, negative anxiety, negative fatigue, negative mania  Central nervous system:  Cranial nerves II through XII intact, tongue/uvula midline, strength bilateral upper extremities 4/5, bilateral upper extremity sensation intact. Bilateral lower extremity paralysis, LLE sensation decreased/absent, RLE sensation intact. negative dysarthria, negative expressive aphasia, negative receptive aphasia. .     Data Reviewed: Care  during the described time interval was provided by me .  I have reviewed this patient's available data, including medical history, events of note, physical examination, and all test results as part of my evaluation.   CBC: Recent Labs  Lab 04/14/17 0242 04/15/17 0431 04/16/17 0328 04/17/17 0321 04/18/17 0241 04/19/17 0353 04/20/17 0736  WBC 11.4* 12.3* 9.8 12.1* 12.9* 14.6* 13.9*  NEUTROABS 10.1* 10.7*  --   --   --   --  11.4*  HGB 10.0* 10.7* 10.4* 11.3* 10.0* 10.7* 9.8*  HCT 29.9* 32.1* 32.4* 35.5* 31.5* 32.2* 30.2*  MCV 89.0 87.9 89.8 91.3 91.0 90.4 90.1  PLT 160 143* 147* 162 202 179 259   Basic Metabolic Panel: Recent Labs  Lab 04/14/17 0242 04/15/17 0431 04/16/17 0328 04/17/17 0321 04/18/17 0241 04/18/17 1642 04/19/17 0534 04/20/17 0736  NA 144 149* 150* 150* 149*  --  139 139  K 3.5 3.0* 3.0* 3.0* 3.0* 4.4 4.0 3.5  CL 113* 110 112* 111 113*  --  106 106  CO2 16* 22 25 25 25   --  21* 23  GLUCOSE 255* 93 193* 250* 100*  --  215* 68  BUN 79* 66* 65* 53* 38*  --  28* 16  CREATININE 4.18* 2.50* 2.06* 1.73* 1.34*  --  1.11* 1.12*  CALCIUM 9.0 8.9 8.7* 8.7* 8.4*  --  8.0* 8.3*  MG 2.6* 2.3 2.4  --   --  2.0 1.9 1.9  PHOS 5.7* 3.7  --   --   --   --   --   --    GFR: Estimated Creatinine Clearance: 60.6 mL/min (A) (by C-G formula based on SCr of 1.12 mg/dL (H)). Liver Function Tests: Recent Labs  Lab 04/14/17 0242  AST 37  ALT 50  ALKPHOS 100  BILITOT 0.6  PROT 5.9*  ALBUMIN 2.0*   No results for input(s): LIPASE, AMYLASE in the last 168 hours. Recent Labs  Lab 04/16/17 1029  AMMONIA 18   Coagulation Profile: Recent Labs  Lab 04/14/17 0242  INR 1.24   Cardiac Enzymes: Recent Labs  Lab 04/14/17 0242 04/15/17 0431  CKTOTAL 1,180* 930*   BNP (last 3 results) No results for input(s): PROBNP in the last 8760 hours. HbA1C: Recent Labs    04/19/17 0700  HGBA1C 10.3*   CBG: Recent Labs  Lab 04/19/17 2359 04/20/17 0301 04/20/17 0407  04/20/17 0830 04/20/17 1158  GLUCAP 115* 55* 113* 54* 98   Lipid Profile: Recent Labs    04/19/17 0534  CHOL 85  HDL 26*  LDLCALC 27  TRIG 423*  CHOLHDL 3.3   Thyroid Function Tests: No results for input(s): TSH, T4TOTAL, FREET4, T3FREE, THYROIDAB in the last 72 hours. Anemia Panel: Recent Labs    04/20/17 0736  VITAMINB12 1,449*  FOLATE 13.8  FERRITIN 216  TIBC  162*  IRON 27*  RETICCTPCT 1.1   Urine analysis:    Component Value Date/Time   COLORURINE AMBER (A) 04/12/2017 1346   APPEARANCEUR CLOUDY (A) 04/12/2017 1346   LABSPEC 1.011 04/12/2017 1346   PHURINE 8.0 04/12/2017 1346   GLUCOSEU NEGATIVE 04/12/2017 1346   HGBUR LARGE (A) 04/12/2017 1346   BILIRUBINUR NEGATIVE 04/12/2017 1346   BILIRUBINUR neg 11/07/2016 1158   KETONESUR NEGATIVE 04/12/2017 1346   PROTEINUR 100 (A) 04/12/2017 1346   UROBILINOGEN 0.2 11/07/2016 1158   UROBILINOGEN 0.2 04/10/2007 0855   NITRITE NEGATIVE 04/12/2017 1346   LEUKOCYTESUR LARGE (A) 04/12/2017 1346   Sepsis Labs: @LABRCNTIP (procalcitonin:4,lacticidven:4)  ) Recent Results (from the past 240 hour(s))  Blood Culture (routine x 2)     Status: Abnormal   Collection Time: 04/12/17 12:50 PM  Result Value Ref Range Status   Specimen Description BLOOD LEFT ANTECUBITAL  Final   Special Requests   Final    BOTTLES DRAWN AEROBIC AND ANAEROBIC Blood Culture adequate volume   Culture  Setup Time   Final    GRAM NEGATIVE RODS IN BOTH AEROBIC AND ANAEROBIC BOTTLES CRITICAL RESULT CALLED TO, READ BACK BY AND VERIFIED WITH: Lieutenant Diego [id number] MLM Performed at [facility], [address] [place], [place] [address]    Culture PROTEUS MIRABILIS (A)  Final   Report Status 04/15/2017 FINAL  Final   Organism ID, Bacteria PROTEUS MIRABILIS  Final      Susceptibility   Proteus mirabilis - MIC*    AMPICILLIN >=32 RESISTANT Resistant     CEFAZOLIN <=4 SENSITIVE Sensitive     CEFEPIME <=1 SENSITIVE Sensitive      CEFTAZIDIME <=1 SENSITIVE Sensitive     CEFTRIAXONE <=1 SENSITIVE Sensitive     CIPROFLOXACIN <=0.25 SENSITIVE Sensitive     GENTAMICIN <=1 SENSITIVE Sensitive     IMIPENEM 2 SENSITIVE Sensitive     TRIMETH/SULFA <=20 SENSITIVE Sensitive     AMPICILLIN/SULBACTAM 8 SENSITIVE Sensitive     PIP/TAZO <=4 SENSITIVE Sensitive     * PROTEUS MIRABILIS  Blood Culture ID Panel (Reflexed)     Status: Abnormal   Collection Time: 04/12/17 12:50 PM  Result Value Ref Range Status   Enterococcus species NOT DETECTED NOT DETECTED Final   Listeria monocytogenes NOT DETECTED NOT DETECTED Final   Staphylococcus species NOT DETECTED NOT DETECTED Final   Staphylococcus aureus NOT DETECTED NOT DETECTED Final   Streptococcus species NOT DETECTED NOT DETECTED Final   Streptococcus agalactiae NOT DETECTED NOT DETECTED Final   Streptococcus pneumoniae NOT DETECTED NOT DETECTED Final   Streptococcus pyogenes NOT DETECTED NOT DETECTED Final   Acinetobacter baumannii NOT DETECTED NOT DETECTED Final   Enterobacteriaceae species DETECTED (A) NOT DETECTED Final    Comment: Enterobacteriaceae represent a large family of gram-negative bacteria, not a single organism. CRITICAL RESULT CALLED TO, READ BACK BY AND VERIFIED WITH: PHARMD G ABBOTT [id number] MLM    Enterobacter cloacae complex NOT DETECTED NOT DETECTED Final   Escherichia coli NOT DETECTED NOT DETECTED Final   Klebsiella oxytoca NOT DETECTED NOT DETECTED Final   Klebsiella pneumoniae NOT DETECTED NOT DETECTED Final   Proteus species DETECTED (A) NOT DETECTED Final    Comment: CRITICAL RESULT CALLED TO, READ BACK BY AND VERIFIED WITH: PHARMD G ABBOTT [id number] MLM    Serratia marcescens NOT DETECTED NOT DETECTED Final   Carbapenem resistance NOT DETECTED NOT DETECTED Final   Haemophilus influenzae NOT DETECTED NOT DETECTED Final  Neisseria meningitidis NOT DETECTED NOT DETECTED Final   Pseudomonas aeruginosa NOT DETECTED NOT DETECTED Final   Candida  albicans NOT DETECTED NOT DETECTED Final   Candida glabrata NOT DETECTED NOT DETECTED Final   Candida krusei NOT DETECTED NOT DETECTED Final   Candida parapsilosis NOT DETECTED NOT DETECTED Final   Candida tropicalis NOT DETECTED NOT DETECTED Final    Comment: Performed at [facility], [address] [place], [place] [address]  Blood Culture (routine x 2)     Status: Abnormal   Collection Time: 04/12/17 12:52 PM  Result Value Ref Range Status   Specimen Description BLOOD LEFT FOREARM  Final   Special Requests IN PEDIATRIC BOTTLE Blood Culture adequate volume  Final   Culture  Setup Time   Final    GRAM NEGATIVE RODS IN PEDIATRIC BOTTLE CRITICAL VALUE NOTED.  VALUE IS CONSISTENT WITH PREVIOUSLY REPORTED AND CALLED VALUE.    Culture (A)  Final    PROTEUS MIRABILIS SUSCEPTIBILITIES PERFORMED ON PREVIOUS CULTURE WITHIN THE LAST 5 DAYS. Performed at [facility], [address] [place], [place] [address]    Report Status 04/15/2017 FINAL  Final  Urine culture     Status: Abnormal   Collection Time: 04/12/17  1:46 PM  Result Value Ref Range Status   Specimen Description URINE, RANDOM  Final   Special Requests   Final    NONE Performed at [facility], [address] [place], [place] [address]    Culture (A)  Final    >=100,000 COLONIES/mL PROTEUS MIRABILIS >=100,000 COLONIES/mL VANCOMYCIN RESISTANT ENTEROCOCCUS    Report Status 04/15/2017 FINAL  Final   Organism ID, Bacteria PROTEUS MIRABILIS (A)  Final   Organism ID, Bacteria VANCOMYCIN RESISTANT ENTEROCOCCUS (A)  Final      Susceptibility   Proteus mirabilis - MIC*    AMPICILLIN >=32 RESISTANT Resistant     CEFAZOLIN <=4 SENSITIVE Sensitive     CEFTRIAXONE <=1 SENSITIVE Sensitive     CIPROFLOXACIN <=0.25 SENSITIVE Sensitive     GENTAMICIN <=1 SENSITIVE Sensitive     IMIPENEM 4 SENSITIVE Sensitive     NITROFURANTOIN 256 RESISTANT Resistant     TRIMETH/SULFA <=20 SENSITIVE Sensitive      AMPICILLIN/SULBACTAM 8 SENSITIVE Sensitive     PIP/TAZO <=4 SENSITIVE Sensitive     * >=100,000 COLONIES/mL PROTEUS MIRABILIS   Vancomycin resistant enterococcus - MIC*    AMPICILLIN <=2 SENSITIVE Sensitive     LEVOFLOXACIN >=8 RESISTANT Resistant     NITROFURANTOIN <=16 SENSITIVE Sensitive     VANCOMYCIN >=32 RESISTANT Resistant     LINEZOLID 2 SENSITIVE Sensitive     * >=100,000 COLONIES/mL VANCOMYCIN RESISTANT ENTEROCOCCUS  MRSA PCR Screening     Status: None   Collection Time: 04/12/17  9:51 PM  Result Value Ref Range Status   MRSA by PCR NEGATIVE NEGATIVE Final    Comment:        The GeneXpert MRSA Assay (FDA approved for NASAL specimens only), is one component of a comprehensive MRSA colonization surveillance program. It is not intended to diagnose MRSA infection nor to guide or monitor treatment for MRSA infections. Performed at [facility], [address], [place], [place] [address]   Culture, respiratory (NON-Expectorated)     Status: None   Collection Time: 04/13/17  5:14 AM  Result Value Ref Range Status   Specimen Description TRACHEAL ASPIRATE  Final   Special Requests NONE  Final   Gram Stain  Final    FEW WBC PRESENT, PREDOMINANTLY PMN MODERATE GRAM POSITIVE COCCI IN PAIRS MODERATE GRAM NEGATIVE RODS FEW GRAM POSITIVE RODS    Culture   Final    MODERATE Consistent with normal respiratory flora. Performed at [facility], [address], [place], [place] [address]    Report Status 04/15/2017 FINAL  Final  Gastrointestinal Panel by PCR , Stool     Status: None   Collection Time: 04/13/17  5:45 AM  Result Value Ref Range Status   Campylobacter species NOT DETECTED NOT DETECTED Final   Plesimonas shigelloides NOT DETECTED NOT DETECTED Final   Salmonella species NOT DETECTED NOT DETECTED Final   Yersinia enterocolitica NOT DETECTED NOT DETECTED Final   Vibrio species NOT DETECTED NOT DETECTED Final   Vibrio cholerae NOT DETECTED NOT DETECTED  Final   Enteroaggregative E coli (EAEC) NOT DETECTED NOT DETECTED Final   Enteropathogenic E coli (EPEC) NOT DETECTED NOT DETECTED Final   Enterotoxigenic E coli (ETEC) NOT DETECTED NOT DETECTED Final   Shiga like toxin producing E coli (STEC) NOT DETECTED NOT DETECTED Final   Shigella/Enteroinvasive E coli (EIEC) NOT DETECTED NOT DETECTED Final   Cryptosporidium NOT DETECTED NOT DETECTED Final   Cyclospora cayetanensis NOT DETECTED NOT DETECTED Final   Entamoeba histolytica NOT DETECTED NOT DETECTED Final   Giardia lamblia NOT DETECTED NOT DETECTED Final   Adenovirus F40/41 NOT DETECTED NOT DETECTED Final   Astrovirus NOT DETECTED NOT DETECTED Final   Norovirus GI/GII NOT DETECTED NOT DETECTED Final   Rotavirus A NOT DETECTED NOT DETECTED Final   Sapovirus (I, II, IV, and V) NOT DETECTED NOT DETECTED Final    Comment: Performed at [facility], [address] [place], [place] [address]  C difficile quick scan w PCR reflex     Status: Abnormal   Collection Time: 04/15/17  9:31 PM  Result Value Ref Range Status   C Diff antigen POSITIVE (A) NEGATIVE Final   C Diff toxin NEGATIVE NEGATIVE Final   C Diff interpretation Results are indeterminate. See PCR results.  Final    Comment: Performed at [facility], [address] [place], [place] [address]  C. Diff by PCR, Reflexed     Status: Abnormal   Collection Time: 04/15/17  9:31 PM  Result Value Ref Range Status   Toxigenic C. Difficile by PCR POSITIVE (A) NEGATIVE Final    Comment: Positive for toxigenic C. difficile with little to no toxin production. Only treat if clinical presentation suggests symptomatic illness.         Radiology Studies: No results found.      Scheduled Meds: . heparin  5,000 Units Subcutaneous Q8H  . insulin aspart  0-15 Units Subcutaneous Q4H  . insulin glargine  35 Units Subcutaneous Daily  . linezolid  600 mg Oral Q12H  . mouth rinse  15 mL Mouth Rinse QID  . pneumococcal 23  valent vaccine  0.5 mL Intramuscular Tomorrow-1000  . protein supplement shake  11 oz Oral BID BM  . sulfamethoxazole-trimethoprim  1 tablet Oral Q12H  . vancomycin  125 mg Oral QID   Continuous Infusions:    LOS: 8 days    Time spent: 40 minutes    Allisha Harter, Roselind Messier, MD [facility] Pager [phone]   If 7PM-7AM, please contact night-coverage [email or web address] Password [facility] 04/20/2017, 3:25 PM

## 2017-04-21 ENCOUNTER — Inpatient Hospital Stay (HOSPITAL_COMMUNITY): Payer: Medicaid Other

## 2017-04-21 LAB — BASIC METABOLIC PANEL
ANION GAP: 12 (ref 5–15)
BUN: 14 mg/dL (ref 6–20)
CHLORIDE: 105 mmol/L (ref 101–111)
CO2: 21 mmol/L — AB (ref 22–32)
Calcium: 8.2 mg/dL — ABNORMAL LOW (ref 8.9–10.3)
Creatinine, Ser: 1.21 mg/dL — ABNORMAL HIGH (ref 0.44–1.00)
GFR calc Af Amer: 59 mL/min — ABNORMAL LOW (ref >60)
GFR, EST NON AFRICAN AMERICAN: 51 mL/min — AB (ref >60)
GLUCOSE: 81 mg/dL (ref 65–99)
POTASSIUM: 4 mmol/L (ref 3.5–5.1)
Sodium: 138 mmol/L (ref 135–145)

## 2017-04-21 LAB — GLUCOSE, CAPILLARY
GLUCOSE-CAPILLARY: 147 mg/dL — AB (ref 65–99)
GLUCOSE-CAPILLARY: 134 mg/dL — AB (ref 65–99)
GLUCOSE-CAPILLARY: 141 mg/dL — AB (ref 65–99)
Glucose-Capillary: 120 mg/dL — ABNORMAL HIGH (ref 65–99)
Glucose-Capillary: 45 mg/dL — ABNORMAL LOW (ref 65–99)
Glucose-Capillary: 55 mg/dL — ABNORMAL LOW (ref 65–99)
Glucose-Capillary: 83 mg/dL (ref 65–99)
Glucose-Capillary: 124 mg/dL — ABNORMAL HIGH (ref 65–99)

## 2017-04-21 LAB — CBC
HEMATOCRIT: 30.8 % — AB (ref 36.0–46.0)
HEMOGLOBIN: 9.8 g/dL — AB (ref 12.0–15.0)
MCH: 28.6 pg (ref 26.0–34.0)
MCHC: 31.8 g/dL (ref 30.0–36.0)
MCV: 89.8 fL (ref 78.0–100.0)
Platelets: 266 10*3/uL (ref 150–400)
RBC: 3.43 MIL/uL — ABNORMAL LOW (ref 3.87–5.11)
RDW: 15.1 % (ref 11.5–15.5)
WBC: 14.9 10*3/uL — ABNORMAL HIGH (ref 4.0–10.5)

## 2017-04-21 LAB — MAGNESIUM: Magnesium: 2 mg/dL (ref 1.7–2.4)

## 2017-04-21 MED ORDER — INSULIN GLARGINE 100 UNIT/ML ~~LOC~~ SOLN: 28.0000 [IU] | Freq: Every day | SUBCUTANEOUS | Status: DC

## 2017-04-21 MED ORDER — GLUCOSE 4 G PO CHEW
1.0000 | CHEWABLE_TABLET | ORAL | Status: AC | PRN
  Administered 2017-04-21: 8 g via ORAL
  Administered 2017-04-22: 4 g via ORAL

## 2017-04-21 MED ORDER — ONDANSETRON HCL 4 MG/2ML IJ SOLN
4.0000 mg | Freq: Four times a day (QID) | INTRAMUSCULAR | Status: DC | PRN
  Filled 2017-04-21: qty 2

## 2017-04-21 MED ORDER — INSULIN ASPART 100 UNIT/ML ~~LOC~~ SOLN
0.0000 [IU] | SUBCUTANEOUS | Status: DC
  Administered 2017-04-21 – 2017-04-22 (×2): 2 [IU] via SUBCUTANEOUS
  Administered 2017-04-23: 1 [IU] via SUBCUTANEOUS
  Administered 2017-04-24: 5 [IU] via SUBCUTANEOUS

## 2017-04-21 MED ORDER — OXYCODONE HCL 5 MG PO TABS
5.0000 mg | ORAL_TABLET | Freq: Four times a day (QID) | ORAL | Status: DC | PRN
  Administered 2017-04-21 – 2017-04-24 (×6): 5 mg via ORAL
  Filled 2017-04-21 (×6): qty 1

## 2017-04-21 MED ORDER — INSULIN GLARGINE 100 UNIT/ML ~~LOC~~ SOLN
28.0000 [IU] | Freq: Every day | SUBCUTANEOUS | Status: DC
  Administered 2017-04-21 – 2017-04-22 (×2): 28 [IU] via SUBCUTANEOUS
  Filled 2017-04-21 (×3): qty 0.28

## 2017-04-21 MED ORDER — LOPERAMIDE HCL 2 MG PO CAPS: 4.0000 mg | ORAL_CAPSULE | ORAL | Status: DC | PRN

## 2017-04-21 MED ORDER — GLUCOSE 4 G PO CHEW
CHEWABLE_TABLET | ORAL | Status: AC
  Administered 2017-04-21: 08:00:00 8 g
  Filled 2017-04-21: qty 1

## 2017-04-21 MED ORDER — ONDANSETRON HCL 4 MG PO TABS
4.0000 mg | ORAL_TABLET | Freq: Three times a day (TID) | ORAL | Status: DC | PRN
  Administered 2017-04-21 – 2017-04-23 (×4): 4 mg via ORAL
  Filled 2017-04-21 (×3): qty 1

## 2017-04-21 NOTE — Progress Notes (Signed)
7:49= CBG 45- 2 orange juices, 2 glucose tablets given.  8:15= CBG 55-  2 orange juices, 2 glucose tablets given. Provider notified, we have not been able to obtain IV access. Provider advised to use tablets, food and drink to maintain. 8:44= CBG 83 10:06= patient called nurse station stating she "feels like my sugar is low", CBG 147. Provider paged regarding lantus dose.  Orders to follow. Will continue to monitor closely.

## 2017-04-21 NOTE — Progress Notes (Signed)
Patient performed NIF -25 with three tries with good patient effort.

## 2017-04-21 NOTE — Progress Notes (Signed)
Pt able to achieve adequate seal, NIF -40 X 2

## 2017-04-21 NOTE — Progress Notes (Signed)
PROGRESS NOTE    Debra [name]  ZOX:[id number] DOB: 1965/07/12 DOA: 04/12/2017 PCP: [name], MD   Brief Narrative:  52 year old PMHx CVA, Anxiety, Depression with psychotic symptoms, Sleep paralysis,Multiple sclerosis (HCC), Neuropathic bladder, Neuropathy of the hands & feet, Restless Leg Syndrome  Adenomatous colonic polyps,Internal hemorrhoids Anemia, Asthma, Difficult intubation, GERD, Heart murmur, HTN,Stable angina pectoris, HLD,  Right Ankle Fracture (10/06/2013), Type I Diabetes Mellitus .  [facility] too the ED for altered mental status.The patient quite obtunded presently. This appears to have started earlier today. She has fairly advanced Multiple sclerosis.The  Patient has a neurogenic bkladder and has been subject to UTIs in the past The patient is not ambulatory without help.She does eat normally as she does niot have a feeding tube The patient has her eyes open. Does not respond to my voice. Does withdraw to noxious stimuli.Has a decreased gag reflex.Will not follow commands. When the patient arrived in the ED her indwelling foley bag was empty.The catheter was changed and than the patient had a large output of somewhat bloody urine. Her BP was a little "soft" on presentation but came up to the normal ran ge after 2 liters of IV fluid.her creatinine is 5 (was 1+ less than 1 month ago) Her blood sugar is >400. The patient has dry mucosae and poor skin turgor. Her AG was 20-22. Bicarb was decreased to 17.The patient has a history of type 1 DM.o2 sat is 100% on RA.her urien showed many bacteria, WBCS, large Hb, 2+ PROTEINURIA.    Subjective: 2/23 A/O 4, negative CP, negative SOB, negative abdominal pain. Positive continued diarrhea. Continued bilateral hip pain unrelieved with Tylenol and tramadol. States setup in chair today for while as well as on the edge of bed while she consumed her meals.      Assessment & Plan:   Active  Problems:   Acute encephalopathy   Sepsis due to urinary tract infection (HCC)   At high risk for aspiration   Endotracheally intubated   Pneumonia due to infectious organism   Pressure injury of skin   Acute respiratory failure (HCC)   Acute metabolic encephalopathy -Resolved  Acute respiratory failure with hypoxia, mechanical ventilation due to poor airway protection, neurological dysfunction -Resolved   Septic shock/ positive Proteus Mirabilis (resistant ampicillin and nitrofurantoin)/VRE UTI. positive Proteus Mirabilis (ampicillin resistant) bacteremia/positive C. Difficile -Spoke with ID Dr Judyann Munson concerning loss of abilitty to administer IV ABX and she recommended the following;  Ceftriaxone-->Bactrium for additional 3 days  Linazolid PO additional 3 days  Continue PO Vancomycin   -If no improvement with diarrhea by 2/24 from C. difficile colitis would add Flagyl to treatment plan. -Start loperamide 2/24 for continued diarrhea  Chronic diastolic CHF -Echocardiogram consistent with CHF see results below -Strict in and out since admission +6.9 L -Daily weight Filed Weights   04/19/17 0654 04/20/17 0452 04/21/17 0935  Weight: 169 lb 12.1 oz (77 kg) 163 lb 12.8 oz (74.3 kg) 169 lb 15.6 oz (77.1 kg)   Acute renal failure -, suspect obstructive uropathy given nonfunctioning chronic Foley catheter, now replaced; resolving Recent Labs  Lab 04/15/17 0431 04/16/17 0328 04/17/17 0321 04/18/17 0241 04/19/17 0534 04/20/17 0736 04/21/17 0412  CREATININE 2.50* 2.06* 1.73* 1.34* 1.11* 1.12* 1.21*  -Resolving  Hypernatremia -Resolved  Hypokalemia -Potassium goal> 4  Multiple Sclerosis -Severe multiple sclerosis in setting of infection. Currently not on medication. -Patient was supposed to establish care with Dr. Despina Arias [facility] however has not been out of the hospital long enough since diagnosis to establish care. Per Dr. Amada Jupiter  who saw patient previously believes patient received at least one dose of rituximab.  -Dr. Amada Jupiter recommended speaking with Dr. Wilford Corner who is more familiar with her case. -Schedule status care appointment with Dr. Despina Arias MS Specialist at [facility] in 30 days for severe MS, new onset.  -Daily NIF/VT: 2/22 -25 cmH 2O/??   Neurogenic bladder -Secondary to MS? -Continue catheterization  Bilateral hip pain -DG hip pending -Tylenol + tramadol not relieving patient's hip pain, especially with increased activity. -OxyIR PRN     Anemia of chronic disease and critical illness -Negative sign of acute bleed  -Anemia panel consistent with anemia of chronic disease. -Transfusion hemoglobin <7    Diabetes type 1 uncontrolled with complications  -12/23/2016 Hemoglobin A1c= 11.4 -2/21 Hemoglobin A1c = 10.3  -last 24 hours patient with 3 episodes of hypoglycemia. -2/23 decrease Lantus 28 units daily -2/23 decrease sensitive SSI -Lipid panel within ADA guidelines          DVT prophylaxis: Subcutaneous heparin Code Status: Full Family Communication: None Disposition Plan: TBD   Consultants:  [facility]   Procedures/Significant Events:  Continuous EEG 2/15 >> generalized slowing, diffuse encephalopathy no evidence of epileptiform activity or ictal evolution CT head 2/17 > no acute findings 2/20 Echocardiogram :Left ventricle: severe LVH. -LVEF =- 55% to 60%. -Grade 1 diastolic   dysfunction. -Left pleural effusion     I have personally reviewed and interpreted all radiology studies and my findings are as above.  VENTILATOR SETTINGS:    Cultures 2/14 blood positive Proteus Mirabilis (ampicillin resistant) 2/14 urine positive Proteus Mirabilis (resistant ampicillin and nitrofurantoin)/VRE 2/15 tracheal aspirate normal respiratory flora 2/15 GI panel negative 2/17 positive C. difficile antigen/C. difficile toxigenic by  PCR     Antimicrobials: Anti-infectives (From admission, onward)   Start     Stop   04/19/17 1708  sulfamethoxazole-trimethoprim (BACTRIM DS,SEPTRA DS) 800-160 MG per tablet 1 tablet     04/22/17 2159   04/19/17 1400  linezolid (ZYVOX) tablet 600 mg     04/22/17 0959   04/18/17 1400  vancomycin (VANCOCIN) 50 mg/mL oral solution 125 mg     04/28/17 1359   04/15/17 1200  linezolid (ZYVOX) IVPB 600 mg  Status:  Discontinued     04/19/17 1311   04/13/17 2100  cefTRIAXone (ROCEPHIN) 2 g in sodium chloride 0.9 % 100 mL IVPB  Status:  Discontinued     04/19/17 1701   04/13/17 1600  ceFEPIme (MAXIPIME) 250 mg in dextrose 5 % 50 mL IVPB  Status:  Discontinued     04/12/17 1623   04/12/17 2100  ceFEPIme (MAXIPIME) 1 g in sodium chloride 0.9 % 100 mL IVPB  Status:  Discontinued     04/13/17 1024   04/12/17 1245  aztreonam (AZACTAM) 2 g in sodium chloride 0.9 % 100 mL IVPB     04/12/17 1327       Devices    LINES / TUBES:      Continuous Infusions:    Objective: Vitals:   04/21/17 0100 04/21/17 0143 04/21/17 0430 04/21/17 0535  BP:  (!) 189/91  (!) 157/87  Pulse:  79  80  Resp:  (!) 22  15  Temp: (!) 97.5 F (36.4 C)  98.5 F (36.9 C)   TempSrc: Oral  Oral   SpO2:  100%  100%  Weight:  Height:        Intake/Output Summary (Last 24 hours) at 04/21/2017 0807 Last data filed at 04/20/2017 1838 Gross per 24 hour  Intake 360 ml  Output 450 ml  Net -90 ml   Filed Weights   04/18/17 0500 04/19/17 0654 04/20/17 0452  Weight: 154 lb 8.7 oz (70.1 kg) 169 lb 12.1 oz (77 kg) 163 lb 12.8 oz (74.3 kg)    Physical Exam:  General:  A/O 4, No acute respiratory distress Neck:  Negative scars, masses, torticollis, lymphadenopathy, JVD Lungs: Clear to auscultation bilaterally without wheezes or crackles Cardiovascular: Regular rate and rhythm without murmur gallop or rub normal S1 and S2 Abdomen: negative abdominal pain, nondistended, positive soft, bowel sounds, no  rebound, no ascites, no appreciable mass Extremities: No significant cyanosis, clubbing, or edema bilateral lower extremities, bilateral pain to palpation of hips. Skin: Negative rashes, lesions, ulcers Psychiatric:  Negative depression, negative anxiety, negative fatigue, negative mania, positive flat affect Central nervous system:  Cranial nerves II through XII intact, tongue/uvula midline, muscle strength bilateral upper extremity 4/5, bilateral upper extremity sensation intact. LLE sensation decreased/absent, RLE sensation intact.  negative dysarthria, negative expressive aphasia, negative receptive aphasia. .     Data Reviewed: Care during the described time interval was provided by me .  I have reviewed this patient's available data, including medical history, events of note, physical examination, and all test results as part of my evaluation.   CBC: Recent Labs  Lab 04/15/17 0431  04/17/17 0321 04/18/17 0241 04/19/17 0353 04/20/17 0736 04/21/17 0412  WBC 12.3*   < > 12.1* 12.9* 14.6* 13.9* 14.9*  NEUTROABS 10.7*  --   --   --   --  11.4*  --   HGB 10.7*   < > 11.3* 10.0* 10.7* 9.8* 9.8*  HCT 32.1*   < > 35.5* 31.5* 32.2* 30.2* 30.8*  MCV 87.9   < > 91.3 91.0 90.4 90.1 89.8  PLT 143*   < > 162 202 179 259 266   < > = values in this interval not displayed.   Basic Metabolic Panel: Recent Labs  Lab 04/15/17 0431 04/16/17 0328 04/17/17 0321 04/18/17 0241 04/18/17 1642 04/19/17 0534 04/20/17 0736 04/21/17 0412  NA 149* 150* 150* 149*  --  139 139 138  K 3.0* 3.0* 3.0* 3.0* 4.4 4.0 3.5 4.0  CL 110 112* 111 113*  --  106 106 105  CO2 22 25 25 25   --  21* 23 21*  GLUCOSE 93 193* 250* 100*  --  215* 68 81  BUN 66* 65* 53* 38*  --  28* 16 14  CREATININE 2.50* 2.06* 1.73* 1.34*  --  1.11* 1.12* 1.21*  CALCIUM 8.9 8.7* 8.7* 8.4*  --  8.0* 8.3* 8.2*  MG 2.3 2.4  --   --  2.0 1.9 1.9 2.0  PHOS 3.7  --   --   --   --   --   --   --    GFR: Estimated Creatinine Clearance:  56.1 mL/min (A) (by C-G formula based on SCr of 1.21 mg/dL (H)). Liver Function Tests: No results for input(s): AST, ALT, ALKPHOS, BILITOT, PROT, ALBUMIN in the last 168 hours. No results for input(s): LIPASE, AMYLASE in the last 168 hours. Recent Labs  Lab 04/16/17 1029  AMMONIA 18   Coagulation Profile: No results for input(s): INR, PROTIME in the last 168 hours. Cardiac Enzymes: Recent Labs  Lab 04/15/17 0431  CKTOTAL 930*  BNP (last 3 results) No results for input(s): PROBNP in the last 8760 hours. HbA1C: Recent Labs    04/19/17 0700  HGBA1C 10.3*   CBG: Recent Labs  Lab 04/20/17 1751 04/20/17 2030 04/20/17 2126 04/21/17 0126 04/21/17 0749  GLUCAP 73 173* 186* 120* 45*   Lipid Profile: Recent Labs    04/19/17 0534  CHOL 85  HDL 26*  LDLCALC 27  TRIG 161*  CHOLHDL 3.3   Thyroid Function Tests: No results for input(s): TSH, T4TOTAL, FREET4, T3FREE, THYROIDAB in the last 72 hours. Anemia Panel: Recent Labs    04/20/17 0736  VITAMINB12 1,449*  FOLATE 13.8  FERRITIN 216  TIBC 162*  IRON 27*  RETICCTPCT 1.1   Urine analysis:    Component Value Date/Time   COLORURINE AMBER (A) 04/12/2017 1346   APPEARANCEUR CLOUDY (A) 04/12/2017 1346   LABSPEC 1.011 04/12/2017 1346   PHURINE 8.0 04/12/2017 1346   GLUCOSEU NEGATIVE 04/12/2017 1346   HGBUR LARGE (A) 04/12/2017 1346   BILIRUBINUR NEGATIVE 04/12/2017 1346   BILIRUBINUR neg 11/07/2016 1158   KETONESUR NEGATIVE 04/12/2017 1346   PROTEINUR 100 (A) 04/12/2017 1346   UROBILINOGEN 0.2 11/07/2016 1158   UROBILINOGEN 0.2 04/10/2007 0855   NITRITE NEGATIVE 04/12/2017 1346   LEUKOCYTESUR LARGE (A) 04/12/2017 1346   Sepsis Labs: @LABRCNTIP (procalcitonin:4,lacticidven:4)  ) Recent Results (from the past 240 hour(s))  Blood Culture (routine x 2)     Status: Abnormal   Collection Time: 04/12/17 12:50 PM  Result Value Ref Range Status   Specimen Description BLOOD LEFT ANTECUBITAL  Final   Special  Requests   Final    BOTTLES DRAWN AEROBIC AND ANAEROBIC Blood Culture adequate volume   Culture  Setup Time   Final    GRAM NEGATIVE RODS IN BOTH AEROBIC AND ANAEROBIC BOTTLES CRITICAL RESULT CALLED TO, READ BACK BY AND VERIFIED WITH: Lieutenant Diego [id number] MLM Performed at [facility], [address], [place], [place] [address]    Culture PROTEUS MIRABILIS (A)  Final   Report Status 04/15/2017 FINAL  Final   Organism ID, Bacteria PROTEUS MIRABILIS  Final      Susceptibility   Proteus mirabilis - MIC*    AMPICILLIN >=32 RESISTANT Resistant     CEFAZOLIN <=4 SENSITIVE Sensitive     CEFEPIME <=1 SENSITIVE Sensitive     CEFTAZIDIME <=1 SENSITIVE Sensitive     CEFTRIAXONE <=1 SENSITIVE Sensitive     CIPROFLOXACIN <=0.25 SENSITIVE Sensitive     GENTAMICIN <=1 SENSITIVE Sensitive     IMIPENEM 2 SENSITIVE Sensitive     TRIMETH/SULFA <=20 SENSITIVE Sensitive     AMPICILLIN/SULBACTAM 8 SENSITIVE Sensitive     PIP/TAZO <=4 SENSITIVE Sensitive     * PROTEUS MIRABILIS  Blood Culture ID Panel (Reflexed)     Status: Abnormal   Collection Time: 04/12/17 12:50 PM  Result Value Ref Range Status   Enterococcus species NOT DETECTED NOT DETECTED Final   Listeria monocytogenes NOT DETECTED NOT DETECTED Final   Staphylococcus species NOT DETECTED NOT DETECTED Final   Staphylococcus aureus NOT DETECTED NOT DETECTED Final   Streptococcus species NOT DETECTED NOT DETECTED Final   Streptococcus agalactiae NOT DETECTED NOT DETECTED Final   Streptococcus pneumoniae NOT DETECTED NOT DETECTED Final   Streptococcus pyogenes NOT DETECTED NOT DETECTED Final   Acinetobacter baumannii NOT DETECTED NOT DETECTED Final   Enterobacteriaceae species DETECTED (A) NOT DETECTED Final    Comment: Enterobacteriaceae represent a large family of gram-negative bacteria, not a single  organism. CRITICAL RESULT CALLED TO, READ BACK BY AND VERIFIED WITH: PHARMD G ABBOTT [id number] MLM    Enterobacter cloacae  complex NOT DETECTED NOT DETECTED Final   Escherichia coli NOT DETECTED NOT DETECTED Final   Klebsiella oxytoca NOT DETECTED NOT DETECTED Final   Klebsiella pneumoniae NOT DETECTED NOT DETECTED Final   Proteus species DETECTED (A) NOT DETECTED Final    Comment: CRITICAL RESULT CALLED TO, READ BACK BY AND VERIFIED WITH: PHARMD G ABBOTT [id number] MLM    Serratia marcescens NOT DETECTED NOT DETECTED Final   Carbapenem resistance NOT DETECTED NOT DETECTED Final   Haemophilus influenzae NOT DETECTED NOT DETECTED Final   Neisseria meningitidis NOT DETECTED NOT DETECTED Final   Pseudomonas aeruginosa NOT DETECTED NOT DETECTED Final   Candida albicans NOT DETECTED NOT DETECTED Final   Candida glabrata NOT DETECTED NOT DETECTED Final   Candida krusei NOT DETECTED NOT DETECTED Final   Candida parapsilosis NOT DETECTED NOT DETECTED Final   Candida tropicalis NOT DETECTED NOT DETECTED Final    Comment: Performed at [facility], [address], [place], [place] [address]  Blood Culture (routine x 2)     Status: Abnormal   Collection Time: 04/12/17 12:52 PM  Result Value Ref Range Status   Specimen Description BLOOD LEFT FOREARM  Final   Special Requests IN PEDIATRIC BOTTLE Blood Culture adequate volume  Final   Culture  Setup Time   Final    GRAM NEGATIVE RODS IN PEDIATRIC BOTTLE CRITICAL VALUE NOTED.  VALUE IS CONSISTENT WITH PREVIOUSLY REPORTED AND CALLED VALUE.    Culture (A)  Final    PROTEUS MIRABILIS SUSCEPTIBILITIES PERFORMED ON PREVIOUS CULTURE WITHIN THE LAST 5 DAYS. Performed at [facility], [address], [place], [place] [address]    Report Status 04/15/2017 FINAL  Final  Urine culture     Status: Abnormal   Collection Time: 04/12/17  1:46 PM  Result Value Ref Range Status   Specimen Description URINE, RANDOM  Final   Special Requests   Final    NONE Performed at [facility], [address], [place], [place] [address]    Culture (A)  Final     >=100,000 COLONIES/mL PROTEUS MIRABILIS >=100,000 COLONIES/mL VANCOMYCIN RESISTANT ENTEROCOCCUS    Report Status 04/15/2017 FINAL  Final   Organism ID, Bacteria PROTEUS MIRABILIS (A)  Final   Organism ID, Bacteria VANCOMYCIN RESISTANT ENTEROCOCCUS (A)  Final      Susceptibility   Proteus mirabilis - MIC*    AMPICILLIN >=32 RESISTANT Resistant     CEFAZOLIN <=4 SENSITIVE Sensitive     CEFTRIAXONE <=1 SENSITIVE Sensitive     CIPROFLOXACIN <=0.25 SENSITIVE Sensitive     GENTAMICIN <=1 SENSITIVE Sensitive     IMIPENEM 4 SENSITIVE Sensitive     NITROFURANTOIN 256 RESISTANT Resistant     TRIMETH/SULFA <=20 SENSITIVE Sensitive     AMPICILLIN/SULBACTAM 8 SENSITIVE Sensitive     PIP/TAZO <=4 SENSITIVE Sensitive     * >=100,000 COLONIES/mL PROTEUS MIRABILIS   Vancomycin resistant enterococcus - MIC*    AMPICILLIN <=2 SENSITIVE Sensitive     LEVOFLOXACIN >=8 RESISTANT Resistant     NITROFURANTOIN <=16 SENSITIVE Sensitive     VANCOMYCIN >=32 RESISTANT Resistant     LINEZOLID 2 SENSITIVE Sensitive     * >=100,000 COLONIES/mL VANCOMYCIN RESISTANT ENTEROCOCCUS  MRSA PCR Screening     Status: None   Collection Time: 04/12/17  9:51 PM  Result Value Ref Range Status  MRSA by PCR NEGATIVE NEGATIVE Final    Comment:        The GeneXpert MRSA Assay (FDA approved for NASAL specimens only), is one component of a comprehensive MRSA colonization surveillance program. It is not intended to diagnose MRSA infection nor to guide or monitor treatment for MRSA infections. Performed at [facility], [address] [place], [place] [address]   Culture, respiratory (NON-Expectorated)     Status: None   Collection Time: 04/13/17  5:14 AM  Result Value Ref Range Status   Specimen Description TRACHEAL ASPIRATE  Final   Special Requests NONE  Final   Gram Stain   Final    FEW WBC PRESENT, PREDOMINANTLY PMN MODERATE GRAM POSITIVE COCCI IN PAIRS MODERATE GRAM NEGATIVE RODS FEW GRAM POSITIVE RODS     Culture   Final    MODERATE Consistent with normal respiratory flora. Performed at [facility], [address] [place], [place] [address]    Report Status 04/15/2017 FINAL  Final  Gastrointestinal Panel by PCR , Stool     Status: None   Collection Time: 04/13/17  5:45 AM  Result Value Ref Range Status   Campylobacter species NOT DETECTED NOT DETECTED Final   Plesimonas shigelloides NOT DETECTED NOT DETECTED Final   Salmonella species NOT DETECTED NOT DETECTED Final   Yersinia enterocolitica NOT DETECTED NOT DETECTED Final   Vibrio species NOT DETECTED NOT DETECTED Final   Vibrio cholerae NOT DETECTED NOT DETECTED Final   Enteroaggregative E coli (EAEC) NOT DETECTED NOT DETECTED Final   Enteropathogenic E coli (EPEC) NOT DETECTED NOT DETECTED Final   Enterotoxigenic E coli (ETEC) NOT DETECTED NOT DETECTED Final   Shiga like toxin producing E coli (STEC) NOT DETECTED NOT DETECTED Final   Shigella/Enteroinvasive E coli (EIEC) NOT DETECTED NOT DETECTED Final   Cryptosporidium NOT DETECTED NOT DETECTED Final   Cyclospora cayetanensis NOT DETECTED NOT DETECTED Final   Entamoeba histolytica NOT DETECTED NOT DETECTED Final   Giardia lamblia NOT DETECTED NOT DETECTED Final   Adenovirus F40/41 NOT DETECTED NOT DETECTED Final   Astrovirus NOT DETECTED NOT DETECTED Final   Norovirus GI/GII NOT DETECTED NOT DETECTED Final   Rotavirus A NOT DETECTED NOT DETECTED Final   Sapovirus (I, II, IV, and V) NOT DETECTED NOT DETECTED Final    Comment: Performed at [facility], [address] [place], [place] [address]  C difficile quick scan w PCR reflex     Status: Abnormal   Collection Time: 04/15/17  9:31 PM  Result Value Ref Range Status   C Diff antigen POSITIVE (A) NEGATIVE Final   C Diff toxin NEGATIVE NEGATIVE Final   C Diff interpretation Results are indeterminate. See PCR results.  Final    Comment: Performed at [facility], [address], [place], [place]  [address]  C. Diff by PCR, Reflexed     Status: Abnormal   Collection Time: 04/15/17  9:31 PM  Result Value Ref Range Status   Toxigenic C. Difficile by PCR POSITIVE (A) NEGATIVE Final    Comment: Positive for toxigenic C. difficile with little to no toxin production. Only treat if clinical presentation suggests symptomatic illness.  Culture, blood (routine x 2)     Status: None (Preliminary result)   Collection Time: 04/19/17  9:27 AM  Result Value Ref Range Status   Specimen Description BLOOD RIGHT ANTECUBITAL  Final   Special Requests Blood Culture adequate volume IN PEDIATRIC BOTTLE  Final  Culture   Final    NO GROWTH 1 DAY Performed at [facility], [address], [place], [place] [address]    Report Status PENDING  Incomplete  Culture, blood (routine x 2)     Status: None (Preliminary result)   Collection Time: 04/19/17  9:27 AM  Result Value Ref Range Status   Specimen Description BLOOD LEFT ARM  Final   Special Requests Blood Culture adequate volume IN PEDIATRIC BOTTLE  Final   Culture   Final    NO GROWTH 1 DAY Performed at [facility], [address], [place], [place] [address]    Report Status PENDING  Incomplete         Radiology Studies: No results found.      Scheduled Meds: . heparin  5,000 Units Subcutaneous Q8H  . insulin aspart  0-15 Units Subcutaneous Q4H  . insulin glargine  35 Units Subcutaneous Daily  . linezolid  600 mg Oral Q12H  . mouth rinse  15 mL Mouth Rinse QID  . pneumococcal 23 valent vaccine  0.5 mL Intramuscular Tomorrow-1000  . protein supplement shake  11 oz Oral BID BM  . sulfamethoxazole-trimethoprim  1 tablet Oral Q12H  . vancomycin  125 mg Oral QID   Continuous Infusions:    LOS: 9 days    Time spent: 40 minutes    WOODS, Roselind Messier, MD [facility] Pager [phone]   If 7PM-7AM, please contact night-coverage [email or web address] Password [facility] 04/21/2017, 8:07 AM

## 2017-04-22 DIAGNOSIS — M25551 Pain in right hip: Secondary | ICD-10-CM

## 2017-04-22 DIAGNOSIS — M25552 Pain in left hip: Secondary | ICD-10-CM

## 2017-04-22 LAB — BASIC METABOLIC PANEL
Anion gap: 7 (ref 5–15)
BUN: 9 mg/dL (ref 6–20)
CHLORIDE: 110 mmol/L (ref 101–111)
CO2: 22 mmol/L (ref 22–32)
CREATININE: 1.12 mg/dL — AB (ref 0.44–1.00)
Calcium: 7.1 mg/dL — ABNORMAL LOW (ref 8.9–10.3)
GFR calc Af Amer: >60 mL/min (ref >60)
GFR calc non Af Amer: 56 mL/min — ABNORMAL LOW (ref >60)
Glucose, Bld: 104 mg/dL — ABNORMAL HIGH (ref 65–99)
Potassium: 3.7 mmol/L (ref 3.5–5.1)
SODIUM: 139 mmol/L (ref 135–145)

## 2017-04-22 LAB — GLUCOSE, CAPILLARY
GLUCOSE-CAPILLARY: 85 mg/dL (ref 65–99)
GLUCOSE-CAPILLARY: 119 mg/dL — AB (ref 65–99)
GLUCOSE-CAPILLARY: 96 mg/dL (ref 65–99)
GLUCOSE-CAPILLARY: 90 mg/dL (ref 65–99)
GLUCOSE-CAPILLARY: 108 mg/dL — AB (ref 65–99)
GLUCOSE-CAPILLARY: 112 mg/dL — AB (ref 65–99)
GLUCOSE-CAPILLARY: 166 mg/dL — AB (ref 65–99)
Glucose-Capillary: 54 mg/dL — ABNORMAL LOW (ref 65–99)
Glucose-Capillary: 55 mg/dL — ABNORMAL LOW (ref 65–99)
Glucose-Capillary: 66 mg/dL (ref 65–99)
Glucose-Capillary: 75 mg/dL (ref 65–99)

## 2017-04-22 LAB — CBC
HCT: 25 % — ABNORMAL LOW (ref 36.0–46.0)
Hemoglobin: 8 g/dL — ABNORMAL LOW (ref 12.0–15.0)
MCH: 29 pg (ref 26.0–34.0)
MCHC: 32 g/dL (ref 30.0–36.0)
MCV: 90.6 fL (ref 78.0–100.0)
PLATELETS: 252 10*3/uL (ref 150–400)
RBC: 2.76 MIL/uL — ABNORMAL LOW (ref 3.87–5.11)
RDW: 15.3 % (ref 11.5–15.5)
WBC: 11.2 10*3/uL — AB (ref 4.0–10.5)

## 2017-04-22 LAB — HEMOGLOBIN AND HEMATOCRIT, BLOOD
HEMATOCRIT: 27.1 % — AB (ref 36.0–46.0)
HEMOGLOBIN: 8.7 g/dL — AB (ref 12.0–15.0)

## 2017-04-22 LAB — MAGNESIUM: Magnesium: 1.6 mg/dL — ABNORMAL LOW (ref 1.7–2.4)

## 2017-04-22 MED ORDER — PROMETHAZINE HCL 25 MG/ML IJ SOLN
12.5000 mg | Freq: Four times a day (QID) | INTRAMUSCULAR | Status: AC | PRN
  Administered 2017-04-22 (×2): 12.5 mg via INTRAVENOUS
  Filled 2017-04-22 (×2): qty 1

## 2017-04-22 MED ORDER — SODIUM CHLORIDE 0.9% FLUSH
10.0000 mL | INTRAVENOUS | Status: DC | PRN
  Administered 2017-04-23 (×2): 10 mL
  Administered 2017-04-24: 20 mL
  Filled 2017-04-22 (×3): qty 40

## 2017-04-22 NOTE — Procedures (Signed)
Patient refused to perform NIF 

## 2017-04-22 NOTE — Progress Notes (Signed)
Linton Flemings, NP returned call to RN and instructed RN to give D50 IV, RN informed NP that patient has no IV access at this time.  RN contacted IV team to ask them to come ASAP for IV access.  Patient still alert and oriented, eating peanut butter and graham crackers and drinking juice.  RN will continue to monitor patient and report any issues to NP as needed.  P.J. Henderson Newcomer, RN

## 2017-04-22 NOTE — Progress Notes (Signed)
Patient's CBG @ 0051 was 75, patient given regular Sprite and CBG was 66 on recheck.  CNA gave patient orange juice with sugar as well and CBG was 55 on recheck.  Patient given glucose tablet x1 and on recheck CBG was 54.  RN called Linton Flemings, NP to report this, awaiting response.  P.J. Henderson Newcomer, RN

## 2017-04-22 NOTE — Progress Notes (Signed)
Patient's Flexiseal came out, her stool is not all liquid. I plan to monitor the stool to see if it is too thick for the flexiseal.

## 2017-04-22 NOTE — Progress Notes (Signed)
PROGRESS NOTE    Debra [name]  ZOX:[id number] DOB: Nov 19, 1965 DOA: 04/12/2017 PCP: [name], MD   Brief Narrative: Debra [name] is a 52 y.o. PMHx CVA, Anxiety, Depression with psychotic symptoms, Sleep paralysis,Multiple sclerosis (HCC), Neuropathic bladder, Neuropathy of the hands &feet, Restless Leg Syndrome  Adenomatous colonic polyps,Internal hemorrhoids Anemia, Asthma, Difficult intubation, GERD, Heart murmur, HTN,Stable angina pectoris, HLD,  Right Ankle Fracture (10/06/2013), Type I Diabetes Mellitus .  [facility]red from [facility] too the ED for altered mental status.The patient quite obtunded presently. This appears to have started earlier today. She has fairly advanced Multiple sclerosis.The Patient has a neurogenic bkladder and has been subject to UTIs in the past The patient is not ambulatory without help.She does eat normally as she does niot have a feeding tube The patient has her eyes open. Does not respond to my voice. Does withdraw to noxious stimuli.Has a decreased gag reflex.Will not follow commands. When the patient arrived in the ED her indwelling foley bag was empty.The catheter was changed and than the patient had a large output of somewhat bloody urine. Her BP was a little "soft" on presentation but came up to the normal ran ge after 2 liters of IV fluid.her creatinine is 5 (was 1+ less than 1 month ago) Her blood sugar is >400. The patient has dry mucosae and poor skin turgor. Her AG was 20-22. Bicarb was decreased to 17.The patient has a history of type 1 DM.o2 sat is 100% on RA.her urien showed many bacteria, WBCS, large Hb, 2+ PROTEINURIA.   Assessment & Plan:   Active Problems:   Acute encephalopathy   Sepsis due to urinary tract infection (HCC)   At high risk for aspiration   Endotracheally intubated   Pneumonia due to infectious organism   Pressure injury of skin   Acute respiratory failure (HCC)   Acute metabolic  encephalopathy Resolved  Acute respiratory failure with hypoxia S/p mechanical ventilation Resolved.  Septic shock Proteus Mirabilis/VRE UTI Proteus Mirabilis bacteremia C. Difficile infection -Continue Vancomycin PO -Was previously on Ceftriaxone and Linezolid IV and transitioned to Bactrim and Linezolid PO  Chronic diastolic heart failure Stable. -Strict in/out and daily weights  Acute renal failure Obstructive uropathy. S/p foley. Resolved  Hypernatremia Resolved.  Hypokalemia Resolved.  Multiple sclerosis Outpatient neurology follow-up. Is supposed to see Dr. Gerome Sam at [facility] -Continue NIF/VT  Neurogenic bladder Likely secondary to MS. Currently has a foley catheter  Bilateral hip pain Unknown etiology. X-ray negative for fracture -Continue analgesics  Anemia of chronic disease/critical illness Stable  Diabetes mellitus, type 1 Last hemoglobin A1C of 10.3% -Continue decreased dose of Lantus 28 units -Continue SSI   DVT prophylaxis: Subcutaneous heparin Code Status:   Code Status: Prior Family Communication: None Disposition Plan: Pending medical management   Consultants:   PCCM  Neurology  Procedures:   Continuous EEG (2/15)  Transthoracic Echocardiogram (2/20) Study Conclusions  - Left ventricle: The cavity size was normal. Wall thickness was   increased in a pattern of severe LVH. Systolic function was   normal. The estimated ejection fraction was in the range of 55%   to 60%. Wall motion was normal; there were no regional wall   motion abnormalities. Doppler parameters are consistent with   abnormal left ventricular relaxation (grade 1 diastolic   dysfunction). - Aortic valve: Trileaflet; moderately thickened, mildly calcified   leaflets. - Pericardium, extracardiac: A trivial pericardial effusion was   identified posterior to the heart. There  was a left pleural   effusion.  Antimicrobials:  Cefepime (2/14)  Aztreonam  (2/14)  Ceftriaxone (2/15>>2/20)  Linezolid IV (04/01/15>>2/20)  Linezolid PO (2/21>>  Bactrim (2/21)  Vancomycin PO (2/20)    Subjective: Lateral hip pain bilaterally  Objective: Vitals:   04/21/17 0935 04/21/17 1958 04/22/17 0406 04/22/17 0500  BP: (!) 165/84 (!) 138/59 (!) 151/74   Pulse: 81 82 82   Resp: 18 18 16    Temp: 98.6 F (37 C) 98.2 F (36.8 C) 98.1 F (36.7 C)   TempSrc: Oral Oral    SpO2: 100% 100% 100%   Weight: 77.1 kg (169 lb 15.6 oz)   75.3 kg (166 lb 0.1 oz)  Height: 5\' 5"  (1.651 m)       Intake/Output Summary (Last 24 hours) at 04/22/2017 0938 Last data filed at 04/22/2017 0245 Gross per 24 hour  Intake 582 ml  Output -  Net 582 ml   Filed Weights   04/20/17 0452 04/21/17 0935 04/22/17 0500  Weight: 74.3 kg (163 lb 12.8 oz) 77.1 kg (169 lb 15.6 oz) 75.3 kg (166 lb 0.1 oz)    Examination:  General exam: Appears calm and comfortable Respiratory system: Clear to auscultation. Respiratory effort normal. Cardiovascular system: S1 & S2 heard, RRR. No murmurs, rubs, gallops or clicks. Gastrointestinal system: Abdomen is nondistended, soft and nontender. No organomegaly or masses felt. Normal bowel sounds heard. Central nervous system: Alert and oriented. Extremities: No edema. No calf tenderness. No hip tenderness Skin: No cyanosis. No rashes Psychiatry: Judgement and insight appear normal. Mood & affect appropriate.     Data Reviewed: I have personally reviewed following labs and imaging studies  CBC: Recent Labs  Lab 04/18/17 0241 04/19/17 0353 04/20/17 0736 04/21/17 0412 04/22/17 0345  WBC 12.9* 14.6* 13.9* 14.9* 11.2*  NEUTROABS  --   --  11.4*  --   --   HGB 10.0* 10.7* 9.8* 9.8* 8.0*  HCT 31.5* 32.2* 30.2* 30.8* 25.0*  MCV 91.0 90.4 90.1 89.8 90.6  PLT 202 179 259 266 252   Basic Metabolic Panel: Recent Labs  Lab 04/18/17 0241 04/18/17 1642 04/19/17 0534 04/20/17 0736 04/21/17 0412 04/22/17 0345  NA 149*  --  139 139  138 139  K 3.0* 4.4 4.0 3.5 4.0 3.7  CL 113*  --  106 106 105 110  CO2 25  --  21* 23 21* 22  GLUCOSE 100*  --  215* 68 81 104*  BUN 38*  --  28* 16 14 9   CREATININE 1.34*  --  1.11* 1.12* 1.21* 1.12*  CALCIUM 8.4*  --  8.0* 8.3* 8.2* 7.1*  MG  --  2.0 1.9 1.9 2.0 1.6*   GFR: Estimated Creatinine Clearance: 61 mL/min (A) (by C-G formula based on SCr of 1.12 mg/dL (H)). Liver Function Tests: No results for input(s): AST, ALT, ALKPHOS, BILITOT, PROT, ALBUMIN in the last 168 hours. No results for input(s): LIPASE, AMYLASE in the last 168 hours. Recent Labs  Lab 04/16/17 1029  AMMONIA 18   Coagulation Profile: No results for input(s): INR, PROTIME in the last 168 hours. Cardiac Enzymes: No results for input(s): CKTOTAL, CKMB, CKMBINDEX, TROPONINI in the last 168 hours. BNP (last 3 results) No results for input(s): PROBNP in the last 8760 hours. HbA1C: No results for input(s): HGBA1C in the last 72 hours. CBG: Recent Labs  Lab 04/22/17 0245 04/22/17 0303 04/22/17 0400 04/22/17 0642 04/22/17 0803  GLUCAP 54* 85 119* 96 90   Lipid Profile:  No results for input(s): CHOL, HDL, LDLCALC, TRIG, CHOLHDL, LDLDIRECT in the last 72 hours. Thyroid Function Tests: No results for input(s): TSH, T4TOTAL, FREET4, T3FREE, THYROIDAB in the last 72 hours. Anemia Panel: Recent Labs    04/20/17 0736  VITAMINB12 1,449*  FOLATE 13.8  FERRITIN 216  TIBC 162*  IRON 27*  RETICCTPCT 1.1   Sepsis Labs: No results for input(s): PROCALCITON, LATICACIDVEN in the last 168 hours.  Recent Results (from the past 240 hour(s))  Blood Culture (routine x 2)     Status: Abnormal   Collection Time: 04/12/17 12:50 PM  Result Value Ref Range Status   Specimen Description BLOOD LEFT ANTECUBITAL  Final   Special Requests   Final    BOTTLES DRAWN AEROBIC AND ANAEROBIC Blood Culture adequate volume   Culture  Setup Time   Final    GRAM NEGATIVE RODS IN BOTH AEROBIC AND ANAEROBIC BOTTLES CRITICAL RESULT  CALLED TO, READ BACK BY AND VERIFIED WITH: Lieutenant Diego [id number] MLM Performed at [facility], [address], [place], [place] [address]    Culture PROTEUS MIRABILIS (A)  Final   Report Status 04/15/2017 FINAL  Final   Organism ID, Bacteria PROTEUS MIRABILIS  Final      Susceptibility   Proteus mirabilis - MIC*    AMPICILLIN >=32 RESISTANT Resistant     CEFAZOLIN <=4 SENSITIVE Sensitive     CEFEPIME <=1 SENSITIVE Sensitive     CEFTAZIDIME <=1 SENSITIVE Sensitive     CEFTRIAXONE <=1 SENSITIVE Sensitive     CIPROFLOXACIN <=0.25 SENSITIVE Sensitive     GENTAMICIN <=1 SENSITIVE Sensitive     IMIPENEM 2 SENSITIVE Sensitive     TRIMETH/SULFA <=20 SENSITIVE Sensitive     AMPICILLIN/SULBACTAM 8 SENSITIVE Sensitive     PIP/TAZO <=4 SENSITIVE Sensitive     * PROTEUS MIRABILIS  Blood Culture ID Panel (Reflexed)     Status: Abnormal   Collection Time: 04/12/17 12:50 PM  Result Value Ref Range Status   Enterococcus species NOT DETECTED NOT DETECTED Final   Listeria monocytogenes NOT DETECTED NOT DETECTED Final   Staphylococcus species NOT DETECTED NOT DETECTED Final   Staphylococcus aureus NOT DETECTED NOT DETECTED Final   Streptococcus species NOT DETECTED NOT DETECTED Final   Streptococcus agalactiae NOT DETECTED NOT DETECTED Final   Streptococcus pneumoniae NOT DETECTED NOT DETECTED Final   Streptococcus pyogenes NOT DETECTED NOT DETECTED Final   Acinetobacter baumannii NOT DETECTED NOT DETECTED Final   Enterobacteriaceae species DETECTED (A) NOT DETECTED Final    Comment: Enterobacteriaceae represent a large family of gram-negative bacteria, not a single organism. CRITICAL RESULT CALLED TO, READ BACK BY AND VERIFIED WITH: PHARMD G ABBOTT [phone] MLM    Enterobacter cloacae complex NOT DETECTED NOT DETECTED Final   Escherichia coli NOT DETECTED NOT DETECTED Final   Klebsiella oxytoca NOT DETECTED NOT DETECTED Final   Klebsiella pneumoniae NOT DETECTED NOT DETECTED  Final   Proteus species DETECTED (A) NOT DETECTED Final    Comment: CRITICAL RESULT CALLED TO, READ BACK BY AND VERIFIED WITH: PHARMD G ABBOTT [id number] MLM    Serratia marcescens NOT DETECTED NOT DETECTED Final   Carbapenem resistance NOT DETECTED NOT DETECTED Final   Haemophilus influenzae NOT DETECTED NOT DETECTED Final   Neisseria meningitidis NOT DETECTED NOT DETECTED Final   Pseudomonas aeruginosa NOT DETECTED NOT DETECTED Final   Candida albicans NOT DETECTED NOT DETECTED Final   Candida glabrata NOT DETECTED NOT DETECTED Final   Candida  krusei NOT DETECTED NOT DETECTED Final   Candida parapsilosis NOT DETECTED NOT DETECTED Final   Candida tropicalis NOT DETECTED NOT DETECTED Final    Comment: Performed at [facility], [address] [place], [place] [address]  Blood Culture (routine x 2)     Status: Abnormal   Collection Time: 04/12/17 12:52 PM  Result Value Ref Range Status   Specimen Description BLOOD LEFT FOREARM  Final   Special Requests IN PEDIATRIC BOTTLE Blood Culture adequate volume  Final   Culture  Setup Time   Final    GRAM NEGATIVE RODS IN PEDIATRIC BOTTLE CRITICAL VALUE NOTED.  VALUE IS CONSISTENT WITH PREVIOUSLY REPORTED AND CALLED VALUE.    Culture (A)  Final    PROTEUS MIRABILIS SUSCEPTIBILITIES PERFORMED ON PREVIOUS CULTURE WITHIN THE LAST 5 DAYS. Performed at [facility], [address], [place], [place] [address]    Report Status 04/15/2017 FINAL  Final  Urine culture     Status: Abnormal   Collection Time: 04/12/17  1:46 PM  Result Value Ref Range Status   Specimen Description URINE, RANDOM  Final   Special Requests   Final    NONE Performed at [facility], [address] [place], [place] [address]    Culture (A)  Final    >=100,000 COLONIES/mL PROTEUS MIRABILIS >=100,000 COLONIES/mL VANCOMYCIN RESISTANT ENTEROCOCCUS    Report Status 04/15/2017 FINAL  Final   Organism ID, Bacteria PROTEUS MIRABILIS (A)  Final    Organism ID, Bacteria VANCOMYCIN RESISTANT ENTEROCOCCUS (A)  Final      Susceptibility   Proteus mirabilis - MIC*    AMPICILLIN >=32 RESISTANT Resistant     CEFAZOLIN <=4 SENSITIVE Sensitive     CEFTRIAXONE <=1 SENSITIVE Sensitive     CIPROFLOXACIN <=0.25 SENSITIVE Sensitive     GENTAMICIN <=1 SENSITIVE Sensitive     IMIPENEM 4 SENSITIVE Sensitive     NITROFURANTOIN 256 RESISTANT Resistant     TRIMETH/SULFA <=20 SENSITIVE Sensitive     AMPICILLIN/SULBACTAM 8 SENSITIVE Sensitive     PIP/TAZO <=4 SENSITIVE Sensitive     * >=100,000 COLONIES/mL PROTEUS MIRABILIS   Vancomycin resistant enterococcus - MIC*    AMPICILLIN <=2 SENSITIVE Sensitive     LEVOFLOXACIN >=8 RESISTANT Resistant     NITROFURANTOIN <=16 SENSITIVE Sensitive     VANCOMYCIN >=32 RESISTANT Resistant     LINEZOLID 2 SENSITIVE Sensitive     * >=100,000 COLONIES/mL VANCOMYCIN RESISTANT ENTEROCOCCUS  MRSA PCR Screening     Status: None   Collection Time: 04/12/17  9:51 PM  Result Value Ref Range Status   MRSA by PCR NEGATIVE NEGATIVE Final    Comment:        The GeneXpert MRSA Assay (FDA approved for NASAL specimens only), is one component of a comprehensive MRSA colonization surveillance program. It is not intended to diagnose MRSA infection nor to guide or monitor treatment for MRSA infections. Performed at [facility], [address] [place], [place] [address]   Culture, respiratory (NON-Expectorated)     Status: None   Collection Time: 04/13/17  5:14 AM  Result Value Ref Range Status   Specimen Description TRACHEAL ASPIRATE  Final   Special Requests NONE  Final   Gram Stain   Final    FEW WBC PRESENT, PREDOMINANTLY PMN MODERATE GRAM POSITIVE COCCI IN PAIRS MODERATE GRAM NEGATIVE RODS FEW GRAM POSITIVE RODS    Culture   Final    MODERATE Consistent with normal  respiratory flora. Performed at [facility], [address], [place], [place] [address]    Report Status 04/15/2017 FINAL  Final    Gastrointestinal Panel by PCR , Stool     Status: None   Collection Time: 04/13/17  5:45 AM  Result Value Ref Range Status   Campylobacter species NOT DETECTED NOT DETECTED Final   Plesimonas shigelloides NOT DETECTED NOT DETECTED Final   Salmonella species NOT DETECTED NOT DETECTED Final   Yersinia enterocolitica NOT DETECTED NOT DETECTED Final   Vibrio species NOT DETECTED NOT DETECTED Final   Vibrio cholerae NOT DETECTED NOT DETECTED Final   Enteroaggregative E coli (EAEC) NOT DETECTED NOT DETECTED Final   Enteropathogenic E coli (EPEC) NOT DETECTED NOT DETECTED Final   Enterotoxigenic E coli (ETEC) NOT DETECTED NOT DETECTED Final   Shiga like toxin producing E coli (STEC) NOT DETECTED NOT DETECTED Final   Shigella/Enteroinvasive E coli (EIEC) NOT DETECTED NOT DETECTED Final   Cryptosporidium NOT DETECTED NOT DETECTED Final   Cyclospora cayetanensis NOT DETECTED NOT DETECTED Final   Entamoeba histolytica NOT DETECTED NOT DETECTED Final   Giardia lamblia NOT DETECTED NOT DETECTED Final   Adenovirus F40/41 NOT DETECTED NOT DETECTED Final   Astrovirus NOT DETECTED NOT DETECTED Final   Norovirus GI/GII NOT DETECTED NOT DETECTED Final   Rotavirus A NOT DETECTED NOT DETECTED Final   Sapovirus (I, II, IV, and V) NOT DETECTED NOT DETECTED Final    Comment: Performed at [facility], [address] [place], [place] [address]  C difficile quick scan w PCR reflex     Status: Abnormal   Collection Time: 04/15/17  9:31 PM  Result Value Ref Range Status   C Diff antigen POSITIVE (A) NEGATIVE Final   C Diff toxin NEGATIVE NEGATIVE Final   C Diff interpretation Results are indeterminate. See PCR results.  Final    Comment: Performed at [facility], [address] [place], [place] [address]  C. Diff by PCR, Reflexed     Status: Abnormal   Collection Time: 04/15/17  9:31 PM  Result Value Ref Range Status   Toxigenic C. Difficile by PCR POSITIVE (A) NEGATIVE Final    Comment:  Positive for toxigenic C. difficile with little to no toxin production. Only treat if clinical presentation suggests symptomatic illness.  Culture, blood (routine x 2)     Status: None (Preliminary result)   Collection Time: 04/19/17  9:27 AM  Result Value Ref Range Status   Specimen Description BLOOD RIGHT ANTECUBITAL  Final   Special Requests Blood Culture adequate volume IN PEDIATRIC BOTTLE  Final   Culture   Final    NO GROWTH 2 DAYS Performed at [facility], [address] [place], [place] [address]    Report Status PENDING  Incomplete  Culture, blood (routine x 2)     Status: None (Preliminary result)   Collection Time: 04/19/17  9:27 AM  Result Value Ref Range Status   Specimen Description BLOOD LEFT ARM  Final   Special Requests Blood Culture adequate volume IN PEDIATRIC BOTTLE  Final   Culture   Final    NO GROWTH 2 DAYS Performed at [facility], [address], [place], [place] [address]    Report Status PENDING  Incomplete         Radiology Studies: Dg Hips Bilat With Pelvis 2v  Result Date: 04/21/2017 CLINICAL DATA:  Bilateral hip pain. EXAM: DG HIP (WITH OR WITHOUT  PELVIS) 2V BILAT COMPARISON:  11/16/2016 FINDINGS: There is no evidence of hip fracture or dislocation. There is no evidence of arthropathy or other focal bone abnormality. IMPRESSION: Negative. Electronically Signed   By: Harmon Pier M.D.   On: 04/21/2017 20:04        Scheduled Meds: . heparin  5,000 Units Subcutaneous Q8H  . insulin aspart  0-9 Units Subcutaneous Q4H  . insulin glargine  28 Units Subcutaneous Daily  . mouth rinse  15 mL Mouth Rinse QID  . pneumococcal 23 valent vaccine  0.5 mL Intramuscular Tomorrow-1000  . protein supplement shake  11 oz Oral BID BM  . sulfamethoxazole-trimethoprim  1 tablet Oral Q12H  . vancomycin  125 mg Oral QID   Continuous Infusions:   LOS: 10 days     Jacquelin Hawking, MD [facility] 04/22/2017, 9:38 AM Pager: [phone]  If 7PM-7AM, please contact night-coverage [email or web address] Password [facility] 04/22/2017, 9:38 AM

## 2017-04-22 NOTE — Progress Notes (Signed)
Patient's flexiseal is in a clear bag in the bathroom, do not throw it out.

## 2017-04-22 NOTE — Progress Notes (Signed)
Pt refused to perform NIF

## 2017-04-23 DIAGNOSIS — N390 Urinary tract infection, site not specified: Secondary | ICD-10-CM

## 2017-04-23 DIAGNOSIS — Z7189 Other specified counseling: Secondary | ICD-10-CM

## 2017-04-23 DIAGNOSIS — Z515 Encounter for palliative care: Secondary | ICD-10-CM

## 2017-04-23 LAB — GLUCOSE, CAPILLARY
GLUCOSE-CAPILLARY: 131 mg/dL — AB (ref 65–99)
GLUCOSE-CAPILLARY: 149 mg/dL — AB (ref 65–99)
GLUCOSE-CAPILLARY: 52 mg/dL — AB (ref 65–99)
GLUCOSE-CAPILLARY: 74 mg/dL (ref 65–99)
GLUCOSE-CAPILLARY: 85 mg/dL (ref 65–99)
Glucose-Capillary: 50 mg/dL — ABNORMAL LOW (ref 65–99)
Glucose-Capillary: 47 mg/dL — ABNORMAL LOW (ref 65–99)
Glucose-Capillary: 83 mg/dL (ref 65–99)

## 2017-04-23 LAB — BASIC METABOLIC PANEL
ANION GAP: 7 (ref 5–15)
BUN: 9 mg/dL (ref 6–20)
CALCIUM: 8.3 mg/dL — AB (ref 8.9–10.3)
CHLORIDE: 107 mmol/L (ref 101–111)
CO2: 24 mmol/L (ref 22–32)
Creatinine, Ser: 1.24 mg/dL — ABNORMAL HIGH (ref 0.44–1.00)
GFR calc Af Amer: 58 mL/min — ABNORMAL LOW (ref >60)
GFR calc non Af Amer: 50 mL/min — ABNORMAL LOW (ref >60)
GLUCOSE: 110 mg/dL — AB (ref 65–99)
Potassium: 3.8 mmol/L (ref 3.5–5.1)
Sodium: 138 mmol/L (ref 135–145)

## 2017-04-23 LAB — CBC
HCT: 27.1 % — ABNORMAL LOW (ref 36.0–46.0)
HEMOGLOBIN: 8.7 g/dL — AB (ref 12.0–15.0)
MCH: 28.9 pg (ref 26.0–34.0)
MCHC: 32.1 g/dL (ref 30.0–36.0)
MCV: 90 fL (ref 78.0–100.0)
Platelets: 262 10*3/uL (ref 150–400)
RBC: 3.01 MIL/uL — ABNORMAL LOW (ref 3.87–5.11)
RDW: 15.3 % (ref 11.5–15.5)
WBC: 12.1 10*3/uL — ABNORMAL HIGH (ref 4.0–10.5)

## 2017-04-23 LAB — MAGNESIUM: Magnesium: 1.8 mg/dL (ref 1.7–2.4)

## 2017-04-23 MED ORDER — MIRTAZAPINE 15 MG PO TABS
15.0000 mg | ORAL_TABLET | Freq: Every day | ORAL | Status: DC
  Administered 2017-04-23: 15 mg via ORAL
  Filled 2017-04-23: qty 1

## 2017-04-23 MED ORDER — INSULIN GLARGINE 100 UNIT/ML ~~LOC~~ SOLN
23.0000 [IU] | Freq: Every day | SUBCUTANEOUS | Status: DC
  Administered 2017-04-23: 23 [IU] via SUBCUTANEOUS
  Filled 2017-04-23 (×2): qty 0.23

## 2017-04-23 NOTE — Consult Note (Signed)
Consultation Note Date: 04/23/2017   Patient Name: Debra [name]  DOB: 14-Aug-1965  MRN: [id number]  Age / Sex: 52 y.o., female  PCP: Aldine Contes, MD Referring Physician: Mariel Aloe, MD  Reason for Consultation: Establishing goals of care  HPI/Patient Profile: 52 y.o. female  with past medical history of MS diagnosed in 10/2016, neurogenic bladder with chronic indwelling foley, type 1 DM,D-CHF, CKD 2, Depression with recent hospitalizations who was admitted on 04/12/2017 with septic shock, acute renal failure, and acute respiratory failure required intubation.  Reportedly she had no urine output for 48 hours prior to admission due to a blood clot blockage.  She required extensive ICU care but now has improved tremendously and will likely be discharged back to a nursing home tomorrow.  Clinical Assessment and Goals of Care:  I have reviewed medical records including EPIC notes, labs and imaging, received report from Dr. Lonny Prude, assessed the patient and then met at the bedside along with her [place] and her adult daughter Debra [name]  to discuss diagnosis prognosis, [place], EOL wishes, disposition and options.  I introduced Palliative Medicine as specialized medical care for people living with serious illness. It focuses on providing relief from the symptoms and stress of a serious illness. The goal is to improve quality of life for both the patient and the family.  We discussed a brief life review of the patient. 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  She has two adult children.  Her son is in his 49s and her daughter is 27.  [place] currently lives in [facility] as she has been unable to walk.  She has had recurrent hospitalizations.  Per her [place], Debra [name] attempted suicide not long ago leading to a psychiatric  hospitalization.  As far as functional and nutritional status she has constant aching and burning pain in her legs.  She is unable to walk, but as we talked she is sitting steadily on the side of the bed.  She has lost weight and does not eat much in the hospital as she does not like the taste of the food.  Her family is her primary source of support.  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  We talked about MS in general.  The types of MS - progressive vs relapsing and remitting - as well as the most common types of symptoms:  Pain, decreased coordination, decreased cognition, depression, bowel and bladder problems, visual problems.  We also talked thru [facility] paperwork and advanced directives.  Debra [name] did not really want to engage in a discussion about a living will but her daughter Debra [name] encouraged her to do so.  I strongly encouraged Debra [name] to at least name an HCPOA.  Outside the room I talked with Debra [name] and Debra [name].  We discussed how sick Debra [name] was at the time of admission with renal failure, respiratory failure and septic shock.  We talked about what  MS may look like at EOL - including inability to walk or swallow correctly.  I explained recurrent aspiration pneumonia on ones own secretions as a terminal illness.  Debra [name] and Debra [name] understood and they were appreciative of the information.  Questions and concerns were addressed.   The family was encouraged to call with questions or concerns.    Primary Decision Maker:  PATIENT    SUMMARY OF RECOMMENDATIONS    Patient would not discuss HCPOA or Advance Directive responses with me, but seemed willing to do so with her daughter.  Chaplain services requested to notarize Advance Directive  Recommend Palliative Care to follow patient at SNF for support, education and guidance.  Patient would like to return to [place]:  Patient would not  discuss  Psycho-social/Spiritual:   Desire for further Chaplaincy support: Support provided and appreciated.  Chaplaincy requested for Advance Directive.  Prognosis:  Unable to determine    Discharge Planning: [place] for rehab with Palliative care service follow-up      Primary Diagnoses: Present on Admission: . Sepsis due to urinary tract infection ([name])   I have reviewed the medical record, interviewed the patient and family, and examined the patient. The following aspects are pertinent.  Past Medical History:  Diagnosis Date  . Adenomatous colonic polyps   . Anemia    2005  . Anxiety    1990  . Arthritis   . Asthma    2000  . Cataract   . Depression with psychotic symptoms   . Difficult intubation    narrow airway  . Gastroesophageal Reflux Disease (GERD)   . Heart murmur    Birth  . Hyperlipidemia    2005  . Hypertension    1998  . Internal hemorrhoids 04/24/06   on colonoscopy  . Migraine   . Multiple sclerosis ([place])   . Neuropathic bladder   . Neuropathy of the hands & feet   . Restless Leg Syndrome   . Right Ankle Fracture 10/06/2013  . Sleep paralysis   . Stable angina pectoris    2007: cath showing normal cors.   . Stroke 1990  . Type I Diabetes Mellitus 1988   Social History   Socioeconomic History  . Marital status: Divorced    Spouse name: None  . Number of children: 2  . Years of education: 1  . Highest education level: None  Social Needs  . Financial resource strain: None  . Food insecurity - worry: None  . Food insecurity - inability: None  . Transportation needs - medical: None  . Transportation needs - non-medical: None  Occupational History  . Occupation: Unemployed  Tobacco Use  . Smoking status: Never Smoker  . Smokeless tobacco: Never Used  Substance and Sexual Activity  . Alcohol use: No    Alcohol/week: 0.0 oz  . Drug use: No    Comment: drug addict  . Sexual activity: Yes    Birth  control/protection: Other-see comments    Comment: pt states she had ablation in 2011  Other Topics Concern  . None  Social History Narrative   Lost medicaid about 2009 ish when her youngest child turned 63. Has adult children. Lives with boyfriend who financially supports her. Has attempted to get disability but has been turned down.      2-3 caffeine drinks daily    Family History  Problem Relation Age of Onset  . Hypertension Mother   . Kidney  disease Mother   . Hypertension Father   . Breast cancer Maternal Grandmother   . Prostate cancer Maternal Grandfather   . Ovarian cancer Paternal Grandmother   . Prostate cancer Paternal Grandfather   . Colon cancer Maternal Uncle        Family history of malignant neoplasm of gastrointestinal tract   Scheduled Meds: . heparin  5,000 Units Subcutaneous Q8H  . insulin aspart  0-9 Units Subcutaneous Q4H  . insulin glargine  23 Units Subcutaneous Daily  . mouth rinse  15 mL Mouth Rinse QID  . mirtazapine  15 mg Oral QHS  . pneumococcal 23 valent vaccine  0.5 mL Intramuscular Tomorrow-1000  . protein supplement shake  11 oz Oral BID BM  . vancomycin  125 mg Oral QID   Continuous Infusions: PRN Meds:.acetaminophen, dextrose, hydrALAZINE, loperamide, ondansetron, oxyCODONE, sodium chloride flush, traMADol Allergies  Allergen Reactions  . Penicillins Anaphylaxis, Nausea And Vomiting and Rash    Has patient had a PCN reaction causing immediate rash, facial/tongue/throat swelling, SOB or lightheadedness with hypotension: Yes Has patient had a PCN reaction causing severe rash involving mucus membranes or skin necrosis: Yes Has patient had a PCN reaction that required hospitalization No Has patient had a PCN reaction occurring within the last 10 years: Yes If all of the above answers are "NO", then may proceed with Cephalosporin use.   . Pollen Extract Other (See Comments)    Seasonal Allergies  . Tape Rash   Review of Systems complains  of LE constant burning pain, insomnia, decreased appetite   Physical Exam  Very pleasant, well developed female with flat affect, A&O, coherent with clear speech. NAD Resp no distress   Vital Signs: BP (!) 158/70 (BP Location: Right Arm)   Pulse 78   Temp 98.6 F (37 C) (Oral)   Resp 16   Ht _0  (1.651 m)   Wt 71 kg (156 lb 8.4 oz)   SpO2 100%   BMI 26.05 kg/m  Pain Assessment: 0-10   Pain Score: Asleep   SpO2: SpO2: 100 % O2 Device:SpO2: 100 % O2 Flow Rate: .O2 Flow Rate (L/min): 0 L/min  IO: Intake/output summary:   Intake/Output Summary (Last 24 hours) at 04/23/2017 1416 Last data filed at 04/23/2017 1401 Gross per 24 hour  Intake 310 ml  Output 1320 ml  Net -1010 ml    LBM: Last BM Date: 04/23/17 Baseline Weight: Weight: 67.2 kg (148 lb 2.4 oz) Most recent weight: Weight: 71 kg (156 lb 8.4 oz)     Palliative Assessment/Data: 50%     Time In: 1:15 Time Out: 2:30 Time Total: 75 min. Greater than 50%  of this time was spent counseling and coordinating care related to the above assessment and plan.  Signed by: Florentina Jenny, PA-C Palliative Medicine Pager: [phone]  Please contact Palliative Medicine Team phone at [phone] for questions and concerns.  For individual provider: See Shea Evans

## 2017-04-23 NOTE — Progress Notes (Signed)
Pt refused to perform NIF.

## 2017-04-23 NOTE — Progress Notes (Signed)
Occupational Therapy Treatment Patient Details Name: Debra [name] MRN: [id number] DOB: Dec 30, 1965 Today's Date: 04/23/2017    History of present illness 52 year old woman with a past medical history of multiple sclerosis was very disabled at baseline, neurogenic bladder with chronic Foley admitted with septic shock with gram-negative organisms and was uremic that progressed to acute renal failure had acute respiratory failure requiring mechanical ventilation, being followed by neurology for altered mental status.   OT comments  Focus of session on bed level mobility and sitting balance/endurance at EOB. Pt tolerated well. Continue to recommend return to SNF with therapy.  Follow Up Recommendations  SNF    Equipment Recommendations       Recommendations for Other Services      Precautions / Restrictions Precautions Precautions: Fall Precaution Comments: patient reports has issues with orthostatic hypotension       Mobility Bed Mobility Overal bed mobility: Needs Assistance Bed Mobility: Sidelying to Sit;Sit to Sidelying Rolling: Min assist Sidelying to sit: Mod assist     Sit to sidelying: Mod assist General bed mobility comments: pt with good use of UEs on rail to roll and push into sitting, assist for LEs, to raise trunk and shift hips to EOB  Transfers                 General transfer comment: pt declined OOB to chair due to fatigue    Balance Overall balance assessment: Needs assistance Sitting-balance support: Feet supported;Bilateral upper extremity supported;Single extremity supported Sitting balance-Leahy Scale: Poor Sitting balance - Comments: min guard assist with propped on one UE, performed oral care                                   ADL either performed or assessed with clinical judgement   ADL Overall ADL's : Needs assistance/impaired     Grooming: Oral care;Set up;Sitting                                  General ADL Comments: declined addressing LB ADL this visit, choosing to work on sitting balance instead     Vision       Perception     Praxis      Cognition Arousal/Alertness: Awake/alert Behavior During Therapy: WFL for tasks assessed/performed;Flat affect Overall Cognitive Status: Within Functional Limits for tasks assessed                                          Exercises     Shoulder Instructions       General Comments      Pertinent Vitals/ Pain       Faces Pain Scale: Hurts little more Pain Location: bil LEs Pain Descriptors / Indicators: Aching;Grimacing Pain Intervention(s): Monitored during session;Repositioned  Home Living                                          Prior Functioning/Environment              Frequency  Min 2X/week        Progress Toward Goals  OT Goals(current goals can now be found in the care plan  section)  Progress towards OT goals: Progressing toward goals  Acute Rehab OT Goals Patient Stated Goal: get stronger OT Goal Formulation: With patient Time For Goal Achievement: 05/03/17 Potential to Achieve Goals: Fair  Plan Discharge plan remains appropriate    Co-evaluation                 AM-PAC PT "6 Clicks" Daily Activity     Outcome Measure   Help from another person eating meals?: A Little Help from another person taking care of personal grooming?: A Little Help from another person toileting, which includes using toliet, bedpan, or urinal?: Total Help from another person bathing (including washing, rinsing, drying)?: A Lot Help from another person to put on and taking off regular upper body clothing?: A Little Help from another person to put on and taking off regular lower body clothing?: Total 6 Click Score: 13    End of Session    OT Visit Diagnosis: Muscle weakness (generalized) (M62.81);Dizziness and giddiness (R42)   Activity Tolerance Patient tolerated  treatment well   Patient Left in bed;with bed alarm set   Nurse Communication          Time: 1601-0932 OT Time Calculation (min): 17 min  Charges: OT General Charges $OT Visit: 1 Visit OT Treatments $Therapeutic Activity: 8-22 mins  04/23/2017 Martie Round, OTR/L Pager: [phone]  Iran Planas [place] B[facility] 04/23/2017, 3:46 PM

## 2017-04-23 NOTE — Progress Notes (Signed)
Completed AD forms, had notarized. Gave copie to patient and one to Licensed conveyancer. Phebe Colla, Chaplain   04/23/17 1500  Clinical Encounter Type  Visited With Patient  Visit Type Follow-up;Other (Comment) (Finished Advance Directive forms)  Referral From Nurse  Consult/Referral To Chaplain  Spiritual Encounters  Spiritual Needs Emotional  Stress Factors  Patient Stress Factors Not reviewed;Other (Comment) (gave oriiginal and 3 copies to patient, 1 copy to Unit Sec)

## 2017-04-23 NOTE — Progress Notes (Signed)
CBG this AM was 47. Patient asymptomatic, no complaints. After 240 cc grape juice, CBG came up to 52 and at that time patient was eating her breakfast. Also, patient received Dextrose 50 - 25 cc per PRN order. MD made aware. Will continue to monitor.

## 2017-04-23 NOTE — Progress Notes (Signed)
PROGRESS NOTE    Debra [name]  IRJ:[id number] DOB: Aug 26, 1965 DOA: 04/12/2017 PCP: [name], MD   Brief Narrative: Debra [name] is a 52 y.o. PMHx CVA, Anxiety, Depression with psychotic symptoms, Sleep paralysis,Multiple sclerosis (HCC), Neuropathic bladder, Neuropathy of the hands &feet, Restless Leg Syndrome  Adenomatous colonic polyps,Internal hemorrhoids Anemia, Asthma, Difficult intubation, GERD, Heart murmur, HTN,Stable angina pectoris, HLD,  Right Ankle Fracture (10/06/2013), Type I Diabetes Mellitus .  Transferred from her [facility] facility too the ED for altered mental status.The patient quite obtunded presently. This appears to have started earlier today. She has fairly advanced Multiple sclerosis.The Patient has a neurogenic bkladder and has been subject to UTIs in the past The patient is not ambulatory without help.She does eat normally as she does niot have a feeding tube The patient has her eyes open. Does not respond to my voice. Does withdraw to noxious stimuli.Has a decreased gag reflex.Will not follow commands. When the patient arrived in the ED her indwelling foley bag was empty.The catheter was changed and than the patient had a large output of somewhat bloody urine. Her BP was a little "soft" on presentation but came up to the normal ran ge after 2 liters of IV fluid.her creatinine is 5 (was 1+ less than 1 month ago) Her blood sugar is >400. The patient has dry mucosae and poor skin turgor. Her AG was 20-22. Bicarb was decreased to 17.The patient has a history of type 1 DM.o2 sat is 100% on RA.her urien showed many bacteria, WBCS, large Hb, 2+ PROTEINURIA.   Assessment & Plan:   Active Problems:   Acute encephalopathy   Sepsis due to urinary tract infection (HCC)   At high risk for aspiration   Endotracheally intubated   Pneumonia due to infectious organism   Pressure injury of skin   Acute respiratory failure (HCC)   Acute metabolic  encephalopathy Resolved  Acute respiratory failure with hypoxia S/p mechanical ventilation Resolved.  Septic shock Proteus Mirabilis/VRE UTI Proteus Mirabilis bacteremia C. Difficile infection -Continue Vancomycin PO -Was previously on Ceftriaxone and Linezolid IV and transitioned to Bactrim and Linezolid PO. Complete course for bacteremia/UTI  Chronic diastolic heart failure Stable. -Strict in/out and daily weights  Acute renal failure Obstructive uropathy. S/p foley. Resolved  Hypernatremia Resolved.  Hypokalemia Resolved.  Multiple sclerosis Outpatient neurology follow-up. Is supposed to see [facility] -Continue NIF/VT  Neurogenic bladder Likely secondary to MS. Currently has a foley catheter. Outpatient urology follow-up  Bilateral hip pain Unknown etiology. X-ray negative for fracture -Continue analgesics  Anemia of chronic disease/critical illness Stable  Diabetes mellitus, type 1 Last hemoglobin A1C of 10.3%. Recurrent hypoglycemia. -Decrease dose of Lantus to 23 units today -Continue SSI  Depressed mood Decreased appetite -Start Remeron   DVT prophylaxis: Subcutaneous heparin Code Status:   Code Status: Prior Family Communication: None Disposition Plan: Discharge to SNF once blood sugar better controlled   Consultants:   PCCM  Neurology  Procedures:   Continuous EEG (2/15)  Transthoracic Echocardiogram (2/20) Study Conclusions  - Left ventricle: The cavity size was normal. Wall thickness was   increased in a pattern of severe LVH. Systolic function was   normal. The estimated ejection fraction was in the range of 55%   to 60%. Wall motion was normal; there were no regional wall   motion abnormalities. Doppler parameters are consistent with   abnormal left ventricular relaxation (grade 1 diastolic   dysfunction). - Aortic valve: Trileaflet; moderately  thickened, mildly calcified   leaflets. - Pericardium,  extracardiac: A trivial pericardial effusion was   identified posterior to the heart. There was a left pleural   effusion.  Antimicrobials:  Cefepime (2/14)  Aztreonam (2/14)  Ceftriaxone (2/15>>2/20)  Linezolid IV (04/01/15>>2/20)  Linezolid PO (2/21>>  Bactrim (2/21)  Vancomycin PO (2/20)    Subjective: Lateral hip pain bilaterally  Objective: Vitals:   04/22/17 1409 04/22/17 1824 04/23/17 0106 04/23/17 0542  BP: (!) 175/83 (!) 119/57 (!) 172/76 (!) 158/70  Pulse: 83 84 85 78  Resp: 19  16 16   Temp: 98.6 F (37 C)  98.9 F (37.2 C) 98.6 F (37 C)  TempSrc: Oral  Oral Oral  SpO2: 94%  100% 100%  Weight:    71 kg (156 lb 8.4 oz)  Height:        Intake/Output Summary (Last 24 hours) at 04/23/2017 1132 Last data filed at 04/23/2017 1032 Gross per 24 hour  Intake 190 ml  Output 1320 ml  Net -1130 ml   Filed Weights   04/21/17 0935 04/22/17 0500 04/23/17 0542  Weight: 77.1 kg (169 lb 15.6 oz) 75.3 kg (166 lb 0.1 oz) 71 kg (156 lb 8.4 oz)    Examination:  General exam: Appears calm and comfortable Respiratory: Clear to auscultation bilaterally. Unlabored work of breathing. No wheezing or rales. Cardiovascular system: S1 & S2 heard, RRR. No murmurs, rubs, gallops or clicks. Gastrointestinal system: Abdomen is nondistended, soft and nontender. No organomegaly or masses felt. Normal bowel sounds heard. Central nervous system: Alert and oriented. Extremities: No edema. No calf tenderness. No hip tenderness Skin: No cyanosis. No rashes Psychiatry: Judgement and insight appear normal. Mood & affect depressed and flat.     Data Reviewed: I have personally reviewed following labs and imaging studies  CBC: Recent Labs  Lab 04/19/17 0353 04/20/17 0736 04/21/17 0412 04/22/17 0345 04/22/17 1221 04/23/17 0350  WBC 14.6* 13.9* 14.9* 11.2*  --  12.1*  NEUTROABS  --  11.4*  --   --   --   --   HGB 10.7* 9.8* 9.8* 8.0* 8.7* 8.7*  HCT 32.2* 30.2* 30.8* 25.0* 27.1*  27.1*  MCV 90.4 90.1 89.8 90.6  --  90.0  PLT 179 259 266 252  --  262   Basic Metabolic Panel: Recent Labs  Lab 04/19/17 0534 04/20/17 0736 04/21/17 0412 04/22/17 0345 04/23/17 0350  NA 139 139 138 139 138  K 4.0 3.5 4.0 3.7 3.8  CL 106 106 105 110 107  CO2 21* 23 21* 22 24  GLUCOSE 215* 68 81 104* 110*  BUN 28* 16 14 9 9   CREATININE 1.11* 1.12* 1.21* 1.12* 1.24*  CALCIUM 8.0* 8.3* 8.2* 7.1* 8.3*  MG 1.9 1.9 2.0 1.6* 1.8   GFR: Estimated Creatinine Clearance: 53.6 mL/min (A) (by C-G formula based on SCr of 1.24 mg/dL (H)). Liver Function Tests: No results for input(s): AST, ALT, ALKPHOS, BILITOT, PROT, ALBUMIN in the last 168 hours. No results for input(s): LIPASE, AMYLASE in the last 168 hours. No results for input(s): AMMONIA in the last 168 hours. Coagulation Profile: No results for input(s): INR, PROTIME in the last 168 hours. Cardiac Enzymes: No results for input(s): CKTOTAL, CKMB, CKMBINDEX, TROPONINI in the last 168 hours. BNP (last 3 results) No results for input(s): PROBNP in the last 8760 hours. HbA1C: No results for input(s): HGBA1C in the last 72 hours. CBG: Recent Labs  Lab 04/23/17 0234 04/23/17 3729 04/23/17 0211 04/23/17 0745 04/23/17 1552  GLUCAP 50* 74 131* 47* 52*   Lipid Profile: No results for input(s): CHOL, HDL, LDLCALC, TRIG, CHOLHDL, LDLDIRECT in the last 72 hours. Thyroid Function Tests: No results for input(s): TSH, T4TOTAL, FREET4, T3FREE, THYROIDAB in the last 72 hours. Anemia Panel: No results for input(s): VITAMINB12, FOLATE, FERRITIN, TIBC, IRON, RETICCTPCT in the last 72 hours. Sepsis Labs: No results for input(s): PROCALCITON, LATICACIDVEN in the last 168 hours.  Recent Results (from the past 240 hour(s))  C difficile quick scan w PCR reflex     Status: Abnormal   Collection Time: 04/15/17  9:31 PM  Result Value Ref Range Status   C Diff antigen POSITIVE (A) NEGATIVE Final   C Diff toxin NEGATIVE NEGATIVE Final   C Diff  interpretation Results are indeterminate. See PCR results.  Final    Comment: Performed at [facility], [address], [place], [place] [address]  C. Diff by PCR, Reflexed     Status: Abnormal   Collection Time: 04/15/17  9:31 PM  Result Value Ref Range Status   Toxigenic C. Difficile by PCR POSITIVE (A) NEGATIVE Final    Comment: Positive for toxigenic C. difficile with little to no toxin production. Only treat if clinical presentation suggests symptomatic illness.  Culture, blood (routine x 2)     Status: None (Preliminary result)   Collection Time: 04/19/17  9:27 AM  Result Value Ref Range Status   Specimen Description BLOOD RIGHT ANTECUBITAL  Final   Special Requests Blood Culture adequate volume IN PEDIATRIC BOTTLE  Final   Culture   Final    NO GROWTH 3 DAYS Performed at [facility], [address], [place], [place] [address]    Report Status PENDING  Incomplete  Culture, blood (routine x 2)     Status: None (Preliminary result)   Collection Time: 04/19/17  9:27 AM  Result Value Ref Range Status   Specimen Description BLOOD LEFT ARM  Final   Special Requests Blood Culture adequate volume IN PEDIATRIC BOTTLE  Final   Culture   Final    NO GROWTH 3 DAYS Performed at [facility], [address] [place], [place] [address]    Report Status PENDING  Incomplete         Radiology Studies: Dg Hips Bilat With Pelvis 2v  Result Date: 04/21/2017 CLINICAL DATA:  Bilateral hip pain. EXAM: DG HIP (WITH OR WITHOUT PELVIS) 2V BILAT COMPARISON:  11/16/2016 FINDINGS: There is no evidence of hip fracture or dislocation. There is no evidence of arthropathy or other focal bone abnormality. IMPRESSION: Negative. Electronically Signed   By: Harmon Pier M.D.   On: 04/21/2017 20:04        Scheduled Meds: . heparin  5,000 Units Subcutaneous Q8H  . insulin aspart  0-9 Units Subcutaneous Q4H  . insulin glargine  23 Units Subcutaneous Daily  . mouth rinse  15 mL Mouth  Rinse QID  . pneumococcal 23 valent vaccine  0.5 mL Intramuscular Tomorrow-1000  . protein supplement shake  11 oz Oral BID BM  . vancomycin  125 mg Oral QID   Continuous Infusions:   LOS: 11 days     Jacquelin Hawking, MD [facility] 04/23/2017, 11:32 AM Pager: [phone]  If 7PM-7AM, please contact night-coverage [email or web address] Password [facility] 04/23/2017, 11:32 AM

## 2017-04-23 NOTE — Clinical Social Work Note (Signed)
Clinical Social Work Assessment  Patient Details  Name: Debra [name] MRN: [id number] Date of Birth: 1965-09-29  Date of referral:  04/23/17               Reason for consult:  Discharge Planning                Permission sought to share information with:  Facility Industrial/product designer granted to share information::  Yes, Verbal Permission Granted  Name::     Aram Beecham  Agency::  [facility]  Relationship::  Aunt  Contact Information:  [phone]  Housing/Transportation Living arrangements for the past 2 months:  Skilled Nursing Facility Source of Information:  Other (Comment Required)(Aunt) Patient Interpreter Needed:  None Criminal Activity/Legal Involvement Pertinent to Current Situation/Hospitalization:  No - Comment as needed Significant Relationships:  Adult Children, Other(Comment) Lives with:  Facility Resident Do you feel safe going back to the place where you live?  No Need for family participation in patient care:  Yes (Comment)  Care giving concerns:  CSW received consult for possible SNF placement at time of discharge. CSW spoke with patient regarding return to [facility]. CSW to continue to follow and assist with discharge planning needs.   Social Worker assessment / plan:  CSW spoke with patient concerning possibility of rehab at [facility] before returning home.  Employment status:  Disabled (Comment on whether or not currently receiving Disability) Insurance information:  Medicaid In [place] PT Recommendations:  Skilled Nursing Facility Information / Referral to community resources:  Skilled Nursing Facility  Patient/Family's Response to care:  Patient reports understanding of discharge plan and will require PTAR.   Patient/Family's Understanding of and Emotional Response to Diagnosis, Current Treatment, and Prognosis:  Patient/family is realistic regarding therapy needs and expressed being hopeful for return to SNF placement. Patient expressed  understanding of CSW role and discharge process as well as medical condition. No questions/concerns about plan or treatment.    Emotional Assessment Appearance:  Appears stated age Attitude/Demeanor/Rapport:  Other(Appropriate) Affect (typically observed):  Accepting, Appropriate Orientation:  Oriented to Self, Oriented to Situation, Oriented to Place, Oriented to  Time Alcohol / Substance use:  Not Applicable Psych involvement (Current and /or in the community):  No (Comment)  Discharge Needs  Concerns to be addressed:  Care Coordination Readmission within the last 30 days:  No Current discharge risk:  None Barriers to Discharge:  No Barriers Identified   Mearl Latin, LCSWA 04/23/2017, 10:49 AM

## 2017-04-23 NOTE — Progress Notes (Signed)
Physical Therapy Treatment Patient Details Name: Debra [name] MRN: [id number] DOB: 13-Jun-1965 Today's Date: 04/23/2017    History of Present Illness 52 year old woman with a past medical history of multiple sclerosis was very disabled at baseline, neurogenic bladder with chronic Foley admitted with septic shock with gram-negative organisms and was uremic that progressed to acute renal failure had acute respiratory failure requiring mechanical ventilation, being followed by neurology for altered mental status.    PT Comments    Continuing work on functional mobility and activity tolerance;  Very tired from having been up in the chair most of the morning; We worked on sitting EOB and weight shifts for scooting and transfers; Getting close ot meeting sitting balance goals   Follow Up Recommendations  SNF(Recommend restarting therapies at SNF)     Equipment Recommendations  Other (comment)(TBA)    Recommendations for Other Services       Precautions / Restrictions Precautions Precautions: Fall Precaution Comments: patient reports has issues with orthostatic hypotension Restrictions Weight Bearing Restrictions: No    Mobility  Bed Mobility Overal bed mobility: Needs Assistance Bed Mobility: Rolling;Sidelying to Sit Rolling: Min assist Sidelying to sit: Mod assist       General bed mobility comments: noting good reach and UE pull on rails for rolling; min assist for LEs; very nice use of UEs to initiate and follow through on pushing up sidelie to sit  Transfers Overall transfer level: Needs assistance              Lateral/Scoot Transfers: Max assist;+2 physical assistance;+2 safety/equipment General transfer comment: Worked on lateral scooting at EOB; Noting good weight shifting with cues; still needing +2 assist and bed pad to move hips  Ambulation/Gait                 Stairs            Wheelchair Mobility    Modified Rankin (Stroke Patients  Only)       Balance       Sitting balance - Comments: Pt sat EOB with min guard and single UE and bil UEs for support. Worked on weight shifts and reciprocal scooting forward and backwards                                    Cognition Arousal/Alertness: Awake/alert Behavior During Therapy: WFL for tasks assessed/performed;Flat affect Overall Cognitive Status: Within Functional Limits for tasks assessed                                        Exercises      General Comments        Pertinent Vitals/Pain Pain Assessment: Faces Faces Pain Scale: Hurts little more Pain Location: bil LEs Pain Descriptors / Indicators: Aching;Grimacing Pain Intervention(s): Monitored during session    Home Living                      Prior Function            PT Goals (current goals can now be found in the care plan section) Acute Rehab PT Goals Patient Stated Goal: get stronger PT Goal Formulation: With patient Time For Goal Achievement: 05/03/17 Potential to Achieve Goals: Fair Progress towards PT goals: Progressing toward goals(Slowly)    Frequency    Min  3X/week      PT Plan Current plan remains appropriate    Co-evaluation              AM-PAC PT "6 Clicks" Daily Activity  Outcome Measure  Difficulty turning over in bed (including adjusting bedclothes, sheets and blankets)?: A Lot Difficulty moving from lying on back to sitting on the side of the bed? : Unable Difficulty sitting down on and standing up from a chair with arms (e.g., wheelchair, bedside commode, etc,.)?: Unable Help needed moving to and from a bed to chair (including a wheelchair)?: Total Help needed walking in hospital room?: Total Help needed climbing 3-5 steps with a railing? : Total 6 Click Score: 7    End of Session Equipment Utilized During Treatment: Other (comment)(bed pad) Activity Tolerance: Patient tolerated treatment well Patient left: with call  bell/phone within reach;in chair Nurse Communication: Mobility status PT Visit Diagnosis: Muscle weakness (generalized) (M62.81)     Time: 1004-1030 PT Time Calculation (min) (ACUTE ONLY): 26 min  Charges:  $Therapeutic Activity: 23-37 mins                    G Codes:       Van Clines, PT  Acute Rehabilitation Services Pager [phone] Office [phone]    Levi Aland 04/23/2017, 11:54 AM

## 2017-04-23 NOTE — Progress Notes (Signed)
Pt refused NIF. Will continue to monitor.

## 2017-04-23 NOTE — Progress Notes (Signed)
Patient has a lot on her mind.  Trying to cope with recent MS diagnosis and the damage it has already done. Experiencing frustration trying to deal with so many changes in her life all of a sidden.  In pretty good spirits and appreciative of chaplain visit and prayer. Phebe Colla, Chaplain   04/23/17 1200  Clinical Encounter Type  Visited With Patient  Visit Type Initial;Spiritual support  Referral From Nurse  Consult/Referral To Chaplain  Spiritual Encounters  Spiritual Needs Prayer;Emotional;Other (Comment)  Stress Factors  Patient Stress Factors Exhausted;Health changes;Major life changes (1 year in hospital (different facilities))  Family Stress Factors None identified

## 2017-04-24 DIAGNOSIS — N320 Bladder-neck obstruction: Secondary | ICD-10-CM

## 2017-04-24 DIAGNOSIS — E1039 Type 1 diabetes mellitus with other diabetic ophthalmic complication: Secondary | ICD-10-CM

## 2017-04-24 LAB — CBC
HCT: 27.6 % — ABNORMAL LOW (ref 36.0–46.0)
Hemoglobin: 8.8 g/dL — ABNORMAL LOW (ref 12.0–15.0)
MCH: 28.6 pg (ref 26.0–34.0)
MCHC: 31.9 g/dL (ref 30.0–36.0)
MCV: 89.6 fL (ref 78.0–100.0)
Platelets: 331 10*3/uL (ref 150–400)
RBC: 3.08 MIL/uL — AB (ref 3.87–5.11)
RDW: 15.4 % (ref 11.5–15.5)
WBC: 10.1 10*3/uL (ref 4.0–10.5)

## 2017-04-24 LAB — CULTURE, BLOOD (ROUTINE X 2)
Culture: NO GROWTH
Culture: NO GROWTH
SPECIAL REQUESTS: ADEQUATE
SPECIAL REQUESTS: ADEQUATE

## 2017-04-24 LAB — GLUCOSE, CAPILLARY
GLUCOSE-CAPILLARY: 182 mg/dL — AB (ref 65–99)
GLUCOSE-CAPILLARY: 186 mg/dL — AB (ref 65–99)
Glucose-Capillary: 143 mg/dL — ABNORMAL HIGH (ref 65–99)
Glucose-Capillary: 187 mg/dL — ABNORMAL HIGH (ref 65–99)
Glucose-Capillary: 164 mg/dL — ABNORMAL HIGH (ref 65–99)
Glucose-Capillary: 290 mg/dL — ABNORMAL HIGH (ref 65–99)

## 2017-04-24 LAB — BASIC METABOLIC PANEL
Anion gap: 9 (ref 5–15)
BUN: 6 mg/dL (ref 6–20)
CO2: 24 mmol/L (ref 22–32)
CREATININE: 1.25 mg/dL — AB (ref 0.44–1.00)
Calcium: 8.6 mg/dL — ABNORMAL LOW (ref 8.9–10.3)
Chloride: 106 mmol/L (ref 101–111)
GFR, EST AFRICAN AMERICAN: 57 mL/min — AB (ref >60)
GFR, EST NON AFRICAN AMERICAN: 49 mL/min — AB (ref >60)
Glucose, Bld: 187 mg/dL — ABNORMAL HIGH (ref 65–99)
Potassium: 4.8 mmol/L (ref 3.5–5.1)
SODIUM: 139 mmol/L (ref 135–145)

## 2017-04-24 LAB — MAGNESIUM: MAGNESIUM: 1.9 mg/dL (ref 1.7–2.4)

## 2017-04-24 MED ORDER — INSULIN GLARGINE 100 UNIT/ML ~~LOC~~ SOLN
23.0000 [IU] | Freq: Every day | SUBCUTANEOUS | Status: DC
  Administered 2017-04-24: 23 [IU] via SUBCUTANEOUS
  Filled 2017-04-24: qty 0.23

## 2017-04-24 MED ORDER — INSULIN GLARGINE 100 UNIT/ML ~~LOC~~ SOLN: 23.0000 [IU] | Freq: Every day | SUBCUTANEOUS | Status: DC

## 2017-04-24 MED ORDER — BACLOFEN 5 MG PO TABS: 5.0000 mg | ORAL_TABLET | Freq: Three times a day (TID) | ORAL | 0 refills | Status: AC

## 2017-04-24 MED ORDER — VANCOMYCIN 50 MG/ML ORAL SOLUTION: 125.0000 mg | Freq: Four times a day (QID) | ORAL | Status: AC

## 2017-04-24 MED ORDER — INSULIN GLARGINE 100 UNIT/ML ~~LOC~~ SOLN
25.0000 [IU] | Freq: Every day | SUBCUTANEOUS | Status: DC
  Filled 2017-04-24: qty 0.25

## 2017-04-24 MED ORDER — PREMIER PROTEIN SHAKE: 11.0000 [oz_av] | Freq: Two times a day (BID) | ORAL | 0 refills | Status: DC

## 2017-04-24 MED ORDER — TRAMADOL HCL 50 MG PO TABS: 50.0000 mg | ORAL_TABLET | Freq: Four times a day (QID) | ORAL | 0 refills | Status: DC | PRN

## 2017-04-24 MED ORDER — ZOLPIDEM TARTRATE 5 MG PO TABS: 5.0000 mg | ORAL_TABLET | Freq: Every evening | ORAL | 0 refills | Status: DC | PRN

## 2017-04-24 MED ORDER — MIRTAZAPINE 45 MG PO TABS: 45.0000 mg | ORAL_TABLET | Freq: Every day | ORAL | Status: DC

## 2017-04-24 MED ORDER — LOPERAMIDE HCL 2 MG PO CAPS: 4.0000 mg | ORAL_CAPSULE | ORAL | Status: DC | PRN

## 2017-04-24 NOTE — Discharge Summary (Addendum)
Physician Discharge Summary  Lenya Sterne WJX:[id number] DOB: 12/25/1965 DOA: 04/12/2017  PCP: [name], MD  Admit date: 04/12/2017 Discharge date: 04/24/2017  Admitted From: SNF Disposition: SNF  Recommendations for Outpatient Follow-up:  1. Follow up with PCP in 1 week 2. Neurology follow-up 3. Ophthalmology follow-up for vision issues 4. Please obtain BMP/CBC in one week 5. Please follow up on the following pending results: None  Home Health: SNF Equipment/Devices: SNF  Discharge Condition: Stable CODE STATUS: Full code Diet recommendation: Heart healthy   Brief/Interim Summary:  Admission HPI written by Otho Najjar, MD   HISTORY OF PRESENT ILLNESS:   The patient was transferred from her NH facility too the ED for altered mental status.The patient is quite obtunded presently. This appears to have started earlier today. She has fairly advanced Multiple sclerosis.The  Patient has a neurogenic bkladder and has been subject to UTIs in the past The patient is not ambulatory without help.She does eat normally as she does niot have a feeding tube The patient has her eyes open. Does not respond to my voice. Does withdraw to noxious stimuli.Has a decreased gag reflex.Will not follow commands.tHERE WAS NO WITNESSED SEIZURE ACTIVITY ANDS THE PATIENT DOES NOT HAVE THAT  History. When the patient arrived in the ED her indwelling foley bag was empty.The catheter was changed and than the patient had a large output of somewhat bloody urine. Her BP was a little "soft" on presentation but came up to the normal ran ge after 2 liters of IV fluid.her creatinine is 5 (was 1+ less than 1 month ago) Her blood sugar is >400. The patient has dry mucosae and poor skin turgor. Her AG was 20-22. Bicarb was decreased to 17.The patient has a history of type 1 DM.o2 sat is 100% on RA.her urien showed many bacteria, WBCS, large Hb, 2+ PROTEINURIA.    Hospital course:  Septic  shock Proteus Mirabilis/VRE UTI Catheter associated UTI Proteus Mirabilis bacteremia C. Difficile infection Patient with chronic catheter use. C. Difficile treated with Vancomycin PO. Bacteremia and UTI treated with Ceftriaxone, Bactrim and Linezolid, which were completed.  Acute metabolic encephalopathy Secondary to bacteremia. Resolved  Acute respiratory failure with hypoxia S/p mechanical ventilation Intubated from 2/15 to 2/19. Extubated successfully. On room air.  Chronic diastolic heart failure Stable.  Acute renal failure Obstructive uropathy. S/p foley. Resolved  Hypernatremia Resolved.  Hypokalemia Resolved.  Multiple sclerosis Outpatient neurology follow-up. Is supposed to see Dr. Gerome Sam at [facility]  Neurogenic bladder Likely secondary to MS. Currently has a foley catheter. Outpatient urology follow-up  Bilateral hip pain Unknown etiology. X-ray negative for fracture. Continue analgesics  Anemia of chronic disease/critical illness Stable  Diabetes mellitus, type 1 Last hemoglobin A1C of 10.3%. Recurrent hypoglycemia. Decrease to Lantus 23 units daily secondary to hypoglycemia.  Depressed mood Decreased appetite Continue Remeron  Essential hypertension Resume home regimen.    Discharge Diagnoses:  Active Problems:   Acute encephalopathy   Sepsis due to urinary tract infection (HCC)   At high risk for aspiration   Endotracheally intubated   Pneumonia due to infectious organism   Pressure injury of skin   Acute respiratory failure (HCC)   Recurrent UTI   Palliative care encounter   Goals of care, counseling/discussion    Discharge Instructions   Allergies as of 04/24/2017      Reactions   Penicillins Anaphylaxis, Nausea And Vomiting, Rash   Has patient had a PCN reaction causing immediate rash, facial/tongue/throat swelling, SOB or lightheadedness  with hypotension: Yes Has patient had a PCN reaction causing severe rash  involving mucus membranes or skin necrosis: Yes Has patient had a PCN reaction that required hospitalization No Has patient had a PCN reaction occurring within the last 10 years: Yes If all of the above answers are "NO", then may proceed with Cephalosporin use.   Pollen Extract Other (See Comments)   Seasonal Allergies   Tape Rash      Medication List    STOP taking these medications   DULoxetine 60 MG capsule Commonly known as:  CYMBALTA   glipiZIDE 5 MG tablet Commonly known as:  GLUCOTROL   hydrALAZINE 25 MG tablet Commonly known as:  APRESOLINE   insulin aspart 100 UNIT/ML injection Commonly known as:  novoLOG   pregabalin 100 MG capsule Commonly known as:  LYRICA   rivaroxaban 20 MG Tabs tablet Commonly known as:  XARELTO     TAKE these medications   acetaminophen 325 MG tablet Commonly known as:  TYLENOL Take 650 mg by mouth every 4 (four) hours as needed for mild pain or fever.   amLODipine 10 MG tablet Commonly known as:  NORVASC Take 1 tablet (10 mg total) by mouth daily.   aspirin EC 81 MG tablet Take 1 tablet (81 mg total) by mouth daily.   atorvastatin 40 MG tablet Commonly known as:  LIPITOR Take 1 tablet (40 mg total) by mouth daily.   Baclofen 5 MG Tabs Take 5 mg by mouth 3 (three) times daily. What changed:  how much to take   bethanechol 50 MG tablet Commonly known as:  URECHOLINE Take 1 tablet (50 mg total) by mouth 3 (three) times daily.   feeding supplement (GLUCERNA SHAKE) Liqd Take 237 mLs by mouth 2 (two) times daily between meals.   GLUCAGON HCL (RDNA) IJ Inject 1 mg into the muscle as needed (hypoglycemia-administer for CBG <60).   insulin glargine 100 UNIT/ML injection Commonly known as:  LANTUS Inject 0.23 mLs (23 Units total) into the skin daily. Start taking on:  04/25/2017 What changed:    how much to take  when to take this   lidocaine 5 % Commonly known as:  LIDODERM Place 1 patch onto the skin daily. FOR LOWER  BACK PAIN Remove & Discard patch within 12 hours or as directed by MD   lisinopril 20 MG tablet Commonly known as:  PRINIVIL,ZESTRIL Take 1 tablet (20 mg total) by mouth daily.   loperamide 2 MG capsule Commonly known as:  IMODIUM Take 2 capsules (4 mg total) by mouth as needed for diarrhea or loose stools.   magnesium hydroxide 400 MG/5ML suspension Commonly known as:  MILK OF MAGNESIA Take 30 mLs by mouth daily as needed for mild constipation.   meclizine 12.5 MG tablet Commonly known as:  ANTIVERT Take 12.5 mg by mouth See admin instructions. 12.5mg  by mouth as needed for dizziness one hour before therapy   metoprolol succinate 100 MG 24 hr tablet Commonly known as:  TOPROL-XL Take 1 tablet (100 mg total) by mouth daily. Take with or immediately following a meal.   mirtazapine 45 MG tablet Commonly known as:  REMERON Take 1 tablet (45 mg total) by mouth at bedtime.   ondansetron 4 MG tablet Commonly known as:  ZOFRAN Take 4 mg by mouth daily.   pantoprazole 40 MG tablet Commonly known as:  PROTONIX Take 1 tablet (40 mg total) by mouth daily.   polyethylene glycol packet Commonly known as:  MIRALAX /  GLYCOLAX Take 17 g by mouth daily as needed for mild constipation, moderate constipation or severe constipation.   protein supplement shake Liqd Commonly known as:  PREMIER PROTEIN Take 325 mLs (11 oz total) by mouth 2 (two) times daily between meals.   risperiDONE 1 MG tablet Commonly known as:  RISPERDAL Take 1 tablet (1 mg total) at bedtime by mouth.   tamsulosin 0.4 MG Caps capsule Commonly known as:  FLOMAX Take 1 capsule (0.4 mg total) daily by mouth.   traMADol 50 MG tablet Commonly known as:  ULTRAM Take 1 tablet (50 mg total) by mouth every 6 (six) hours as needed for moderate pain or severe pain.   vancomycin 50 mg/mL oral solution Commonly known as:  VANCOCIN Take 2.5 mLs (125 mg total) by mouth 4 (four) times daily for 4 days.   Vitamin D  (Ergocalciferol) 50000 units Caps capsule Commonly known as:  DRISDOL Take 1 capsule (50,000 Units total) by mouth every 7 (seven) days.   zolpidem 5 MG tablet Commonly known as:  AMBIEN Take 1 tablet (5 mg total) by mouth at bedtime as needed for sleep.       Contact information for follow-up providers    Sater, Pearletha Furl, MD. Schedule an appointment as soon as possible for a visit in 1 month(s).   Specialty:  Neurology Why:  Schedule status care appointment with Dr. Despina Arias MS Specialist at [facility] in 30 days for severe MS, new onset. Contact information: [address] [place] [address] [phone]            Contact information for after-discharge care    Destination    [facility] SN[facility] Follow up.   Service:  Skilled Nursing Contact information: [address] [place] [address] [phone]                 Allergies  Allergen Reactions  . Penicillins Anaphylaxis, Nausea And Vomiting and Rash    Has patient had a PCN reaction causing immediate rash, facial/tongue/throat swelling, SOB or lightheadedness with hypotension: Yes Has patient had a PCN reaction causing severe rash involving mucus membranes or skin necrosis: Yes Has patient had a PCN reaction that required hospitalization No Has patient had a PCN reaction occurring within the last 10 years: Yes If all of the above answers are "NO", then may proceed with Cephalosporin use.   . Pollen Extract Other (See Comments)    Seasonal Allergies  . Tape Rash    Consultations:  Critical care  Neurology   Procedures/Studies: Ct Head Wo Contrast  Result Date: 04/15/2017 CLINICAL DATA:  Altered mental status this morning.  History of MS EXAM: CT HEAD WITHOUT CONTRAST TECHNIQUE: Contiguous axial images were obtained from the base of the skull through the vertex without intravenous contrast. COMPARISON:  04/12/2017 and 03/14/2017,  MRI 03/14/2017 FINDINGS: Brain: Ventricles, cisterns and other CSF spaces are within normal. There is no mass, mass effect, shift midline structures or acute hemorrhage. No evidence of acute infarction. There is minimal focal low-attenuation over the subcortical white matter of the frontal lobes unchanged and likely due to known demyelinating disease. Vascular: No hyperdense vessel or unexpected calcification. Skull: Normal. Negative for fracture or focal lesion. Sinuses/Orbits: Orbits are within normal. Opacification of the left frontal and bilateral ethmoid sinus compatible with mild chronic inflammatory disease. Other: None. IMPRESSION: No acute findings. Mild bifrontal subcortical white matter changes which are stable compatible with patient's known MS. Mild  chronic sinus inflammatory disease. Electronically Signed   By: Elberta Fortis M.D.   On: 04/15/2017 10:54   Ct Head Wo Contrast  Result Date: 04/12/2017 CLINICAL DATA:  Altered mental status. History of multiple sclerosis. EXAM: CT HEAD WITHOUT CONTRAST TECHNIQUE: Contiguous axial images were obtained from the base of the skull through the vertex without intravenous contrast. COMPARISON:  MRI brain dated March 14, 2017. CT head dated March 14, 2017. FINDINGS: Brain: No evidence of acute infarction, hemorrhage, hydrocephalus, extra-axial collection or mass lesion/mass effect. Scattered areas of hypodensity in the periventricular deep white matter are not significantly changed when compared to prior study, and are consistent with patient's history of multiple sclerosis. Vascular: Atherosclerotic vascular calcification of the carotid siphons. No hyperdense vessel. Skull: Normal. Negative for fracture or focal lesion. Sinuses/Orbits: Extensive paranasal sinus mucosal thickening. Small air-fluid level in the left sphenoid sinus. Findings are similar to prior study. No acute orbital finding. Other: None. IMPRESSION: 1.  No acute intracranial abnormality.  2. Stable findings related to multiple sclerosis. 3. Chronic paranasal sinus disease, not significantly changed. Electronically Signed   By: Obie Dredge M.D.   On: 04/12/2017 13:07   US Renal  Result Date: 04/12/2017 CLINICAL DATA:  Bladder outlet obstruction. EXAM: RENAL / URINARY TRACT ULTRASOUND COMPLETE COMPARISON:  CT scan 03/14/2017 FINDINGS: Right Kidney: Length: 12.5 cm. Mild fullness right intrarenal collecting system. Mild right hydroureter noted proximally. Echogenicity within normal limits. Left Kidney: Length: 11.2 cm. Echogenicity within normal limits. No mass or hydronephrosis visualized. Bladder: Foley catheter decompresses the urinary bladder. IMPRESSION: Mild right hydronephrosis with mild proximal right hydroureter. Electronically Signed   By: Kennith Center M.D.   On: 04/12/2017 19:01   Dg Chest Port 1 View  Result Date: 04/17/2017 CLINICAL DATA:  Shortness of Breath EXAM: PORTABLE CHEST 1 VIEW COMPARISON:  04/16/2017 FINDINGS: Endotracheal tube and NG tube are unchanged. Cardiomegaly with mild vascular congestion. No confluent airspace opacities, effusions or edema. No acute bony abnormality. IMPRESSION: Cardiomegaly, vascular congestion. Electronically Signed   By: Charlett Nose M.D.   On: 04/17/2017 07:54   Dg Chest Port 1 View  Result Date: 04/16/2017 CLINICAL DATA:  Acute respiratory failure EXAM: PORTABLE CHEST 1 VIEW COMPARISON:  Yesterday FINDINGS: Endotracheal tube tip is between the clavicular heads and carina. An orogastric tube reaches the stomach. Borderline heart size accentuated by technique. The lungs are clear. IMPRESSION: Improved aeration since yesterday.  No evidence of active disease. Electronically Signed   By: Marnee Spring M.D.   On: 04/16/2017 06:58   Dg Chest Port 1 View  Result Date: 04/15/2017 CLINICAL DATA:  Acute respiratory failure. EXAM: PORTABLE CHEST 1 VIEW COMPARISON:  04/14/2017 FINDINGS: Endotracheal tube has tip 3 cm above the carina.  Nasogastric tube courses into the region of the stomach and off the inferior portion of the film. Lungs are adequately inflated and otherwise clear. Cardiomediastinal silhouette and remainder of the exam is unchanged. IMPRESSION: No active disease. Tubes and lines unchanged. Electronically Signed   By: Elberta Fortis M.D.   On: 04/15/2017 08:49   Dg Chest Port 1 View  Result Date: 04/14/2017 CLINICAL DATA:  Acute respiratory failure. EXAM: PORTABLE CHEST 1 VIEW COMPARISON:  04/13/2017 FINDINGS: Endotracheal tube with tip 2.5 cm above the carina. Nasogastric tube courses into the region of the stomach and off the inferior portion of the film as tip is not visualized. Patient is rotated to the left. Lungs are adequately inflated and otherwise clear. Cardiomediastinal silhouette and remainder  of the exam is unchanged. IMPRESSION: No acute cardiopulmonary disease. Tubes and lines as described. Electronically Signed   By: Elberta Fortis M.D.   On: 04/14/2017 09:00   Dg Chest Port 1 View  Result Date: 04/13/2017 CLINICAL DATA:  Intubation. EXAM: PORTABLE CHEST 1 VIEW COMPARISON:  Radiograph yesterday. FINDINGS: Endotracheal tube 2.9 cm from the carina. Enteric tube in place, tip below the diaphragm not included in the field of view. Mild cardiomegaly is unchanged. Low lung volumes. No consolidation, pulmonary edema or pleural fluid. No pneumothorax. IMPRESSION: 1. Endotracheal tube 2.9 cm from the carina. Enteric tube in place, tip below the diaphragm not included in the field of view. 2. Low lung volumes. Electronically Signed   By: Rubye Oaks M.D.   On: 04/13/2017 05:14   Dg Chest Port 1 View  Result Date: 04/12/2017 CLINICAL DATA:  Altered mental status EXAM: PORTABLE CHEST 1 VIEW COMPARISON:  03/14/2017 FINDINGS: EKG leads create artifact across the chest. Chronic cardiomegaly. There is no edema, consolidation, effusion, or pneumothorax. IMPRESSION: 1. No evidence of active disease. 2. Chronic  cardiomegaly. Electronically Signed   By: Marnee Spring M.D.   On: 04/12/2017 13:00   Dg Abd Portable 1v  Result Date: 04/13/2017 CLINICAL DATA:  OG tube placement. EXAM: PORTABLE ABDOMEN - 1 VIEW COMPARISON:  CT abdomen and pelvis 03/14/2017 FINDINGS: The side port of the OG tube terminates in the fundus the stomach. Heart is enlarged. There is no edema or effusion. Surgical clips are present at the gallbladder fossa. There is a relatively gasless appearance to the abdomen. IMPRESSION: The side port of the OG tube is in the fundus of the stomach Electronically Signed   By: Marin Roberts M.D.   On: 04/13/2017 14:20   Dg Hips Bilat With Pelvis 2v  Result Date: 04/21/2017 CLINICAL DATA:  Bilateral hip pain. EXAM: DG HIP (WITH OR WITHOUT PELVIS) 2V BILAT COMPARISON:  11/16/2016 FINDINGS: There is no evidence of hip fracture or dislocation. There is no evidence of arthropathy or other focal bone abnormality. IMPRESSION: Negative. Electronically Signed   By: Harmon Pier M.D.   On: 04/21/2017 20:04      Subjective: Diarrhea resolved.  Discharge Exam: Vitals:   04/23/17 2153 04/24/17 0510  BP: (!) 174/77 (!) 178/76  Pulse: 80 82  Resp: 18 18  Temp: 98.3 F (36.8 C) 98.4 F (36.9 C)  SpO2: 99% 96%   Vitals:   04/23/17 0542 04/23/17 1505 04/23/17 2153 04/24/17 0510  BP: (!) 158/70 (!) 156/64 (!) 174/77 (!) 178/76  Pulse: 78 87 80 82  Resp: 16 16 18 18   Temp: 98.6 F (37 C) 98 F (36.7 C) 98.3 F (36.8 C) 98.4 F (36.9 C)  TempSrc: Oral Oral Oral Oral  SpO2: 100% 100% 99% 96%  Weight: 71 kg (156 lb 8.4 oz)     Height:        General: Pt is alert, awake, not in acute distress    The results of significant diagnostics from this hospitalization (including imaging, microbiology, ancillary and laboratory) are listed below for reference.     Microbiology: Recent Results (from the past 240 hour(s))  C difficile quick scan w PCR reflex     Status: Abnormal   Collection  Time: 04/15/17  9:31 PM  Result Value Ref Range Status   C Diff antigen POSITIVE (A) NEGATIVE Final   C Diff toxin NEGATIVE NEGATIVE Final   C Diff interpretation Results are indeterminate. See PCR results.  Final  Comment: Performed at [facility], [address], [place], [place] [address]  C. Diff by PCR, Reflexed     Status: Abnormal   Collection Time: 04/15/17  9:31 PM  Result Value Ref Range Status   Toxigenic C. Difficile by PCR POSITIVE (A) NEGATIVE Final    Comment: Positive for toxigenic C. difficile with little to no toxin production. Only treat if clinical presentation suggests symptomatic illness.  Culture, blood (routine x 2)     Status: None   Collection Time: 04/19/17  9:27 AM  Result Value Ref Range Status   Specimen Description BLOOD RIGHT ANTECUBITAL  Final   Special Requests Blood Culture adequate volume IN PEDIATRIC BOTTLE  Final   Culture   Final    NO GROWTH 5 DAYS Performed at [facility], [address] [place], [place] [address]    Report Status 04/24/2017 FINAL  Final  Culture, blood (routine x 2)     Status: None   Collection Time: 04/19/17  9:27 AM  Result Value Ref Range Status   Specimen Description BLOOD LEFT ARM  Final   Special Requests Blood Culture adequate volume IN PEDIATRIC BOTTLE  Final   Culture   Final    NO GROWTH 5 DAYS Performed at [facility], [address] [place], [place] [address]    Report Status 04/24/2017 FINAL  Final     Labs: BNP (last 3 results) No results for input(s): BNP in the last 8760 hours. Basic Metabolic Panel: Recent Labs  Lab 04/20/17 0736 04/21/17 0412 04/22/17 0345 04/23/17 0350 04/24/17 0405  NA 139 138 139 138 139  K 3.5 4.0 3.7 3.8 4.8  CL 106 105 110 107 106  CO2 23 21* 22 24 24   GLUCOSE 68 81 104* 110* 187*  BUN 16 14 9 9 6   CREATININE 1.12* 1.21* 1.12* 1.24* 1.25*  CALCIUM 8.3* 8.2* 7.1* 8.3* 8.6*  MG 1.9 2.0 1.6* 1.8 1.9   Liver Function Tests: No results for  input(s): AST, ALT, ALKPHOS, BILITOT, PROT, ALBUMIN in the last 168 hours. No results for input(s): LIPASE, AMYLASE in the last 168 hours. No results for input(s): AMMONIA in the last 168 hours. CBC: Recent Labs  Lab 04/20/17 0736 04/21/17 0412 04/22/17 0345 04/22/17 1221 04/23/17 0350 04/24/17 0405  WBC 13.9* 14.9* 11.2*  --  12.1* 10.1  NEUTROABS 11.4*  --   --   --   --   --   HGB 9.8* 9.8* 8.0* 8.7* 8.7* 8.8*  HCT 30.2* 30.8* 25.0* 27.1* 27.1* 27.6*  MCV 90.1 89.8 90.6  --  90.0 89.6  PLT 259 266 252  --  262 331   Cardiac Enzymes: No results for input(s): CKTOTAL, CKMB, CKMBINDEX, TROPONINI in the last 168 hours. BNP: Invalid input(s): POCBNP CBG: Recent Labs  Lab 04/24/17 0045 04/24/17 0458 04/24/17 0659 04/24/17 0749 04/24/17 1150  GLUCAP 182* 187* 186* 164* 290*   D-Dimer No results for input(s): DDIMER in the last 72 hours. Hgb A1c No results for input(s): HGBA1C in the last 72 hours. Lipid Profile No results for input(s): CHOL, HDL, LDLCALC, TRIG, CHOLHDL, LDLDIRECT in the last 72 hours. Thyroid function studies No results for input(s): TSH, T4TOTAL, T3FREE, THYROIDAB in the last 72 hours.  Invalid input(s): FREET3 Anemia work up No results for input(s): VITAMINB12, FOLATE, FERRITIN, TIBC, IRON, RETICCTPCT in the last 72 hours. Urinalysis    Component Value Date/Time   COLORURINE AMBER (A) 04/12/2017 1346  APPEARANCEUR CLOUDY (A) 04/12/2017 1346   LABSPEC 1.011 04/12/2017 1346   PHURINE 8.0 04/12/2017 1346   GLUCOSEU NEGATIVE 04/12/2017 1346   HGBUR LARGE (A) 04/12/2017 1346   BILIRUBINUR NEGATIVE 04/12/2017 1346   BILIRUBINUR neg 11/07/2016 1158   KETONESUR NEGATIVE 04/12/2017 1346   PROTEINUR 100 (A) 04/12/2017 1346   UROBILINOGEN 0.2 11/07/2016 1158   UROBILINOGEN 0.2 04/10/2007 0855   NITRITE NEGATIVE 04/12/2017 1346   LEUKOCYTESUR LARGE (A) 04/12/2017 1346   Sepsis Labs Invalid input(s): PROCALCITONIN,  WBC,   LACTICIDVEN Microbiology Recent Results (from the past 240 hour(s))  C difficile quick scan w PCR reflex     Status: Abnormal   Collection Time: 04/15/17  9:31 PM  Result Value Ref Range Status   C Diff antigen POSITIVE (A) NEGATIVE Final   C Diff toxin NEGATIVE NEGATIVE Final   C Diff interpretation Results are indeterminate. See PCR results.  Final    Comment: Performed at [facility], [address], [place], [place] [address]  C. Diff by PCR, Reflexed     Status: Abnormal   Collection Time: 04/15/17  9:31 PM  Result Value Ref Range Status   Toxigenic C. Difficile by PCR POSITIVE (A) NEGATIVE Final    Comment: Positive for toxigenic C. difficile with little to no toxin production. Only treat if clinical presentation suggests symptomatic illness.  Culture, blood (routine x 2)     Status: None   Collection Time: 04/19/17  9:27 AM  Result Value Ref Range Status   Specimen Description BLOOD RIGHT ANTECUBITAL  Final   Special Requests Blood Culture adequate volume IN PEDIATRIC BOTTLE  Final   Culture   Final    NO GROWTH 5 DAYS Performed at [facility], [address] [place], [place] [address]    Report Status 04/24/2017 FINAL  Final  Culture, blood (routine x 2)     Status: None   Collection Time: 04/19/17  9:27 AM  Result Value Ref Range Status   Specimen Description BLOOD LEFT ARM  Final   Special Requests Blood Culture adequate volume IN PEDIATRIC BOTTLE  Final   Culture   Final    NO GROWTH 5 DAYS Performed at [facility], [address] [place], [place] [address]    Report Status 04/24/2017 FINAL  Final     Time coordinating discharge: Over 30 minutes  SIGNED:   Jacquelin Hawking, MD [facility] 04/24/2017, 1:32 PM Pager [phone]  If 7PM-7AM, please contact night-coverage [email or web address] Password [email or web address]

## 2017-04-24 NOTE — Progress Notes (Signed)
Patient will DC to: [facility]) Anticipated DC date: 04/24/17 Family notified: Artist by: Sharin Mons  Please make sure signed Ambien script goes with patient.  Per MD patient ready for DC to [facility]. RN, patient, patient's family, and facility notified of DC. Discharge Summary sent to facility. RN given number for report [phone]). DC packet on chart. Ambulance transport requested for patient.   CSW signing off.  Cristobal Goldmann, LCSW Clinical Social Worker [phone]

## 2017-04-24 NOTE — Progress Notes (Signed)
Pt refuses NIF at this time.

## 2017-04-24 NOTE — Progress Notes (Signed)
Patient was discharged to nursing home [name]) by MD order; discharged instructions review and give sent to facility with care notes and prescriptions; IV DIC; patient will be transported to facility via [facility]. RN trying to give report to facility with no success.

## 2017-04-24 NOTE — Progress Notes (Signed)
Patient refused insulin this AM. CBG was 164. MD made aware. Will continue to monitor.

## 2017-04-24 NOTE — Progress Notes (Signed)
Pt refused insulin overnight due to previous low CBGs two night prior. Pt educated on importance of maintaining CBG within measure and still refused.

## 2017-05-05 ENCOUNTER — Inpatient Hospital Stay (HOSPITAL_COMMUNITY)
Admission: EM | Admit: 2017-05-05 | Discharge: 2017-05-25 | DRG: 463 | Disposition: A | Discharge status: Skilled Nursing Facility | Payer: Medicaid Other | Attending: Internal Medicine | Admitting: Internal Medicine

## 2017-05-05 ENCOUNTER — Encounter (HOSPITAL_COMMUNITY): Payer: Self-pay | Admitting: Emergency Medicine

## 2017-05-05 ENCOUNTER — Other Ambulatory Visit: Payer: Self-pay

## 2017-05-05 DIAGNOSIS — G2581 Restless legs syndrome: Secondary | ICD-10-CM | POA: Diagnosis present

## 2017-05-05 DIAGNOSIS — R739 Hyperglycemia, unspecified: Secondary | ICD-10-CM | POA: Diagnosis present

## 2017-05-05 DIAGNOSIS — Z79899 Other long term (current) drug therapy: Secondary | ICD-10-CM | POA: Diagnosis not present

## 2017-05-05 DIAGNOSIS — K219 Gastro-esophageal reflux disease without esophagitis: Secondary | ICD-10-CM | POA: Diagnosis present

## 2017-05-05 DIAGNOSIS — E10649 Type 1 diabetes mellitus with hypoglycemia without coma: Secondary | ICD-10-CM | POA: Diagnosis not present

## 2017-05-05 DIAGNOSIS — E871 Hypo-osmolality and hyponatremia: Secondary | ICD-10-CM | POA: Diagnosis present

## 2017-05-05 DIAGNOSIS — R197 Diarrhea, unspecified: Secondary | ICD-10-CM | POA: Diagnosis not present

## 2017-05-05 DIAGNOSIS — Z7982 Long term (current) use of aspirin: Secondary | ICD-10-CM

## 2017-05-05 DIAGNOSIS — Z9851 Tubal ligation status: Secondary | ICD-10-CM

## 2017-05-05 DIAGNOSIS — M4628 Osteomyelitis of vertebra, sacral and sacrococcygeal region: Secondary | ICD-10-CM | POA: Diagnosis present

## 2017-05-05 DIAGNOSIS — E739 Lactose intolerance, unspecified: Secondary | ICD-10-CM | POA: Diagnosis not present

## 2017-05-05 DIAGNOSIS — M6008 Infective myositis, other site: Secondary | ICD-10-CM | POA: Diagnosis not present

## 2017-05-05 DIAGNOSIS — M869 Osteomyelitis, unspecified: Secondary | ICD-10-CM | POA: Clinically undetermined

## 2017-05-05 DIAGNOSIS — J301 Allergic rhinitis due to pollen: Secondary | ICD-10-CM | POA: Diagnosis not present

## 2017-05-05 DIAGNOSIS — M79605 Pain in left leg: Secondary | ICD-10-CM | POA: Diagnosis not present

## 2017-05-05 DIAGNOSIS — B961 Klebsiella pneumoniae [K. pneumoniae] as the cause of diseases classified elsewhere: Secondary | ICD-10-CM | POA: Diagnosis not present

## 2017-05-05 DIAGNOSIS — G35 Multiple sclerosis: Secondary | ICD-10-CM | POA: Diagnosis present

## 2017-05-05 DIAGNOSIS — Z8249 Family history of ischemic heart disease and other diseases of the circulatory system: Secondary | ICD-10-CM

## 2017-05-05 DIAGNOSIS — E1022 Type 1 diabetes mellitus with diabetic chronic kidney disease: Secondary | ICD-10-CM | POA: Diagnosis present

## 2017-05-05 DIAGNOSIS — Z7401 Bed confinement status: Secondary | ICD-10-CM

## 2017-05-05 DIAGNOSIS — R7881 Bacteremia: Secondary | ICD-10-CM | POA: Diagnosis present

## 2017-05-05 DIAGNOSIS — E1042 Type 1 diabetes mellitus with diabetic polyneuropathy: Secondary | ICD-10-CM | POA: Diagnosis present

## 2017-05-05 DIAGNOSIS — R011 Cardiac murmur, unspecified: Secondary | ICD-10-CM | POA: Diagnosis not present

## 2017-05-05 DIAGNOSIS — D72829 Elevated white blood cell count, unspecified: Secondary | ICD-10-CM | POA: Diagnosis not present

## 2017-05-05 DIAGNOSIS — Z8041 Family history of malignant neoplasm of ovary: Secondary | ICD-10-CM

## 2017-05-05 DIAGNOSIS — E1069 Type 1 diabetes mellitus with other specified complication: Secondary | ICD-10-CM | POA: Diagnosis not present

## 2017-05-05 DIAGNOSIS — M545 Low back pain: Secondary | ICD-10-CM | POA: Diagnosis not present

## 2017-05-05 DIAGNOSIS — Z972 Presence of dental prosthetic device (complete) (partial): Secondary | ICD-10-CM

## 2017-05-05 DIAGNOSIS — Z960 Presence of urogenital implants: Secondary | ICD-10-CM | POA: Diagnosis not present

## 2017-05-05 DIAGNOSIS — D649 Anemia, unspecified: Secondary | ICD-10-CM

## 2017-05-05 DIAGNOSIS — L89320 Pressure ulcer of left buttock, unstageable: Secondary | ICD-10-CM | POA: Diagnosis present

## 2017-05-05 DIAGNOSIS — Z91048 Other nonmedicinal substance allergy status: Secondary | ICD-10-CM | POA: Diagnosis not present

## 2017-05-05 DIAGNOSIS — L89310 Pressure ulcer of right buttock, unstageable: Secondary | ICD-10-CM | POA: Diagnosis present

## 2017-05-05 DIAGNOSIS — Z800 Family history of malignant neoplasm of digestive organs: Secondary | ICD-10-CM

## 2017-05-05 DIAGNOSIS — N179 Acute kidney failure, unspecified: Secondary | ICD-10-CM | POA: Diagnosis present

## 2017-05-05 DIAGNOSIS — R5383 Other fatigue: Secondary | ICD-10-CM | POA: Diagnosis not present

## 2017-05-05 DIAGNOSIS — R6884 Jaw pain: Secondary | ICD-10-CM | POA: Diagnosis not present

## 2017-05-05 DIAGNOSIS — I959 Hypotension, unspecified: Secondary | ICD-10-CM | POA: Diagnosis present

## 2017-05-05 DIAGNOSIS — Z22322 Carrier or suspected carrier of Methicillin resistant Staphylococcus aureus: Secondary | ICD-10-CM

## 2017-05-05 DIAGNOSIS — Z794 Long term (current) use of insulin: Secondary | ICD-10-CM

## 2017-05-05 DIAGNOSIS — R339 Retention of urine, unspecified: Secondary | ICD-10-CM | POA: Diagnosis present

## 2017-05-05 DIAGNOSIS — L89154 Pressure ulcer of sacral region, stage 4: Secondary | ICD-10-CM | POA: Diagnosis present

## 2017-05-05 DIAGNOSIS — E860 Dehydration: Secondary | ICD-10-CM | POA: Diagnosis present

## 2017-05-05 DIAGNOSIS — N183 Chronic kidney disease, stage 3 (moderate): Secondary | ICD-10-CM | POA: Diagnosis present

## 2017-05-05 DIAGNOSIS — I129 Hypertensive chronic kidney disease with stage 1 through stage 4 chronic kidney disease, or unspecified chronic kidney disease: Secondary | ICD-10-CM | POA: Diagnosis present

## 2017-05-05 DIAGNOSIS — N319 Neuromuscular dysfunction of bladder, unspecified: Secondary | ICD-10-CM | POA: Diagnosis present

## 2017-05-05 DIAGNOSIS — R509 Fever, unspecified: Secondary | ICD-10-CM | POA: Diagnosis not present

## 2017-05-05 DIAGNOSIS — R7889 Finding of other specified substances, not normally found in blood: Secondary | ICD-10-CM | POA: Diagnosis not present

## 2017-05-05 DIAGNOSIS — M797 Fibromyalgia: Secondary | ICD-10-CM | POA: Diagnosis present

## 2017-05-05 DIAGNOSIS — F329 Major depressive disorder, single episode, unspecified: Secondary | ICD-10-CM | POA: Diagnosis present

## 2017-05-05 DIAGNOSIS — L89150 Pressure ulcer of sacral region, unstageable: Secondary | ICD-10-CM | POA: Diagnosis not present

## 2017-05-05 DIAGNOSIS — L89159 Pressure ulcer of sacral region, unspecified stage: Secondary | ICD-10-CM | POA: Diagnosis not present

## 2017-05-05 DIAGNOSIS — L0889 Other specified local infections of the skin and subcutaneous tissue: Secondary | ICD-10-CM | POA: Diagnosis not present

## 2017-05-05 DIAGNOSIS — I208 Other forms of angina pectoris: Secondary | ICD-10-CM | POA: Diagnosis present

## 2017-05-05 DIAGNOSIS — N39498 Other specified urinary incontinence: Secondary | ICD-10-CM | POA: Diagnosis present

## 2017-05-05 DIAGNOSIS — Z9049 Acquired absence of other specified parts of digestive tract: Secondary | ICD-10-CM

## 2017-05-05 DIAGNOSIS — Z880 Allergy status to penicillin: Secondary | ICD-10-CM | POA: Diagnosis not present

## 2017-05-05 DIAGNOSIS — L8990 Pressure ulcer of unspecified site, unspecified stage: Secondary | ICD-10-CM | POA: Diagnosis not present

## 2017-05-05 DIAGNOSIS — Z1621 Resistance to vancomycin: Secondary | ICD-10-CM | POA: Diagnosis present

## 2017-05-05 DIAGNOSIS — E8881 Metabolic syndrome: Secondary | ICD-10-CM | POA: Diagnosis present

## 2017-05-05 DIAGNOSIS — M79604 Pain in right leg: Secondary | ICD-10-CM | POA: Diagnosis not present

## 2017-05-05 DIAGNOSIS — Z87892 Personal history of anaphylaxis: Secondary | ICD-10-CM

## 2017-05-05 DIAGNOSIS — D631 Anemia in chronic kidney disease: Secondary | ICD-10-CM | POA: Diagnosis not present

## 2017-05-05 DIAGNOSIS — Z978 Presence of other specified devices: Secondary | ICD-10-CM

## 2017-05-05 DIAGNOSIS — B9689 Other specified bacterial agents as the cause of diseases classified elsewhere: Secondary | ICD-10-CM | POA: Diagnosis not present

## 2017-05-05 DIAGNOSIS — B964 Proteus (mirabilis) (morganii) as the cause of diseases classified elsewhere: Secondary | ICD-10-CM | POA: Diagnosis present

## 2017-05-05 DIAGNOSIS — E109 Type 1 diabetes mellitus without complications: Secondary | ICD-10-CM | POA: Diagnosis not present

## 2017-05-05 DIAGNOSIS — E872 Acidosis: Secondary | ICD-10-CM | POA: Diagnosis present

## 2017-05-05 DIAGNOSIS — Z95828 Presence of other vascular implants and grafts: Secondary | ICD-10-CM | POA: Diagnosis not present

## 2017-05-05 DIAGNOSIS — Z803 Family history of malignant neoplasm of breast: Secondary | ICD-10-CM

## 2017-05-05 DIAGNOSIS — M609 Myositis, unspecified: Secondary | ICD-10-CM | POA: Diagnosis present

## 2017-05-05 DIAGNOSIS — I350 Nonrheumatic aortic (valve) stenosis: Secondary | ICD-10-CM | POA: Diagnosis not present

## 2017-05-05 DIAGNOSIS — N390 Urinary tract infection, site not specified: Secondary | ICD-10-CM

## 2017-05-05 DIAGNOSIS — Z8701 Personal history of pneumonia (recurrent): Secondary | ICD-10-CM

## 2017-05-05 DIAGNOSIS — Z87440 Personal history of urinary (tract) infections: Secondary | ICD-10-CM | POA: Diagnosis not present

## 2017-05-05 DIAGNOSIS — J45909 Unspecified asthma, uncomplicated: Secondary | ICD-10-CM | POA: Diagnosis present

## 2017-05-05 DIAGNOSIS — Z1612 Extended spectrum beta lactamase (ESBL) resistance: Secondary | ICD-10-CM | POA: Diagnosis not present

## 2017-05-05 DIAGNOSIS — R5381 Other malaise: Secondary | ICD-10-CM | POA: Diagnosis not present

## 2017-05-05 DIAGNOSIS — A498 Other bacterial infections of unspecified site: Secondary | ICD-10-CM | POA: Diagnosis present

## 2017-05-05 DIAGNOSIS — R8271 Bacteriuria: Secondary | ICD-10-CM | POA: Diagnosis present

## 2017-05-05 DIAGNOSIS — Z8673 Personal history of transient ischemic attack (TIA), and cerebral infarction without residual deficits: Secondary | ICD-10-CM

## 2017-05-05 DIAGNOSIS — E1065 Type 1 diabetes mellitus with hyperglycemia: Secondary | ICD-10-CM | POA: Diagnosis present

## 2017-05-05 DIAGNOSIS — L89300 Pressure ulcer of unspecified buttock, unstageable: Secondary | ICD-10-CM | POA: Diagnosis not present

## 2017-05-05 DIAGNOSIS — R34 Anuria and oliguria: Secondary | ICD-10-CM | POA: Diagnosis not present

## 2017-05-05 DIAGNOSIS — B952 Enterococcus as the cause of diseases classified elsewhere: Secondary | ICD-10-CM | POA: Diagnosis not present

## 2017-05-05 DIAGNOSIS — S31000A Unspecified open wound of lower back and pelvis without penetration into retroperitoneum, initial encounter: Secondary | ICD-10-CM

## 2017-05-05 DIAGNOSIS — Z8042 Family history of malignant neoplasm of prostate: Secondary | ICD-10-CM

## 2017-05-05 DIAGNOSIS — L89519 Pressure ulcer of right ankle, unspecified stage: Secondary | ICD-10-CM | POA: Diagnosis not present

## 2017-05-05 DIAGNOSIS — E10622 Type 1 diabetes mellitus with other skin ulcer: Secondary | ICD-10-CM | POA: Diagnosis not present

## 2017-05-05 HISTORY — DX: Urinary tract infection, site not specified: N39.0

## 2017-05-05 HISTORY — DX: Type 1 diabetes mellitus with ketoacidosis without coma: E10.10

## 2017-05-05 HISTORY — DX: Sepsis, unspecified organism: A41.9

## 2017-05-05 HISTORY — DX: Low back pain, unspecified: M54.50

## 2017-05-05 HISTORY — DX: Other chronic pain: G89.29

## 2017-05-05 HISTORY — DX: Essential (primary) hypertension: I10

## 2017-05-05 HISTORY — DX: Low back pain: M54.5

## 2017-05-05 HISTORY — DX: Fibromyalgia: M79.7

## 2017-05-05 LAB — URINALYSIS, ROUTINE W REFLEX MICROSCOPIC
Bilirubin Urine: NEGATIVE
Bilirubin Urine: NEGATIVE
Glucose, UA: >=500 mg/dL — AB
KETONES UR: NEGATIVE mg/dL
KETONES UR: NEGATIVE mg/dL
NITRITE: NEGATIVE
Nitrite: NEGATIVE
PH: 6 (ref 5.0–8.0)
PROTEIN: 30 mg/dL — AB
Protein, ur: 30 mg/dL — AB
Specific Gravity, Urine: 1.017 (ref 1.005–1.030)
Specific Gravity, Urine: 1.014 (ref 1.005–1.030)
pH: 5 (ref 5.0–8.0)

## 2017-05-05 LAB — COMPREHENSIVE METABOLIC PANEL
ALBUMIN: 2.2 g/dL — AB (ref 3.5–5.0)
ALK PHOS: 92 U/L (ref 38–126)
ALT: 12 U/L — ABNORMAL LOW (ref 14–54)
ANION GAP: 11 (ref 5–15)
AST: 14 U/L — ABNORMAL LOW (ref 15–41)
BUN: 11 mg/dL (ref 6–20)
CALCIUM: 8.4 mg/dL — AB (ref 8.9–10.3)
CHLORIDE: 98 mmol/L — AB (ref 101–111)
CO2: 25 mmol/L (ref 22–32)
Creatinine, Ser: 1.32 mg/dL — ABNORMAL HIGH (ref 0.44–1.00)
GFR calc non Af Amer: 46 mL/min — ABNORMAL LOW (ref >60)
GFR, EST AFRICAN AMERICAN: 53 mL/min — AB (ref >60)
GLUCOSE: 337 mg/dL — AB (ref 65–99)
POTASSIUM: 3.4 mmol/L — AB (ref 3.5–5.1)
SODIUM: 134 mmol/L — AB (ref 135–145)
Total Bilirubin: 0.4 mg/dL (ref 0.3–1.2)
Total Protein: 5.7 g/dL — ABNORMAL LOW (ref 6.5–8.1)

## 2017-05-05 LAB — CBC WITH DIFFERENTIAL/PLATELET
BASOS PCT: 0 %
Basophils Absolute: 0 10*3/uL (ref 0.0–0.1)
EOS ABS: 0.4 10*3/uL (ref 0.0–0.7)
EOS PCT: 4 %
HCT: 29.1 % — ABNORMAL LOW (ref 36.0–46.0)
HEMOGLOBIN: 9.4 g/dL — AB (ref 12.0–15.0)
LYMPHS ABS: 1.6 10*3/uL (ref 0.7–4.0)
Lymphocytes Relative: 14 %
MCH: 28.4 pg (ref 26.0–34.0)
MCHC: 32.3 g/dL (ref 30.0–36.0)
MCV: 87.9 fL (ref 78.0–100.0)
MONOS PCT: 8 %
Monocytes Absolute: 0.9 10*3/uL (ref 0.1–1.0)
NEUTROS PCT: 74 %
Neutro Abs: 7.9 10*3/uL — ABNORMAL HIGH (ref 1.7–7.7)
PLATELETS: 237 10*3/uL (ref 150–400)
RBC: 3.31 MIL/uL — ABNORMAL LOW (ref 3.87–5.11)
RDW: 14.5 % (ref 11.5–15.5)
WBC: 10.8 10*3/uL — AB (ref 4.0–10.5)

## 2017-05-05 LAB — LACTIC ACID, PLASMA: LACTIC ACID, VENOUS: 1.4 mmol/L (ref 0.5–1.9)

## 2017-05-05 LAB — GLUCOSE, CAPILLARY
GLUCOSE-CAPILLARY: 264 mg/dL — AB (ref 65–99)
Glucose-Capillary: 172 mg/dL — ABNORMAL HIGH (ref 65–99)
Glucose-Capillary: 103 mg/dL — ABNORMAL HIGH (ref 65–99)
Glucose-Capillary: 357 mg/dL — ABNORMAL HIGH (ref 65–99)

## 2017-05-05 LAB — I-STAT VENOUS BLOOD GAS, ED
ACID-BASE EXCESS: 2 mmol/L (ref 0.0–2.0)
BICARBONATE: 28.3 mmol/L — AB (ref 20.0–28.0)
O2 Saturation: 42 %
PCO2 VEN: 51.1 mmHg (ref 44.0–60.0)
PH VEN: 7.351 (ref 7.250–7.430)
PO2 VEN: 25 mmHg — AB (ref 32.0–45.0)
TCO2: 30 mmol/L (ref 22–32)

## 2017-05-05 LAB — C-REACTIVE PROTEIN: CRP: 7.8 mg/dL — AB (ref <1.0)

## 2017-05-05 LAB — CK: CK TOTAL: 227 U/L (ref 38–234)

## 2017-05-05 LAB — I-STAT CG4 LACTIC ACID, ED: Lactic Acid, Venous: 2.03 mmol/L (ref 0.5–1.9)

## 2017-05-05 LAB — CBG MONITORING, ED
Glucose-Capillary: 311 mg/dL — ABNORMAL HIGH (ref 65–99)
Glucose-Capillary: 281 mg/dL — ABNORMAL HIGH (ref 65–99)

## 2017-05-05 LAB — MRSA PCR SCREENING: MRSA BY PCR: INVALID — AB

## 2017-05-05 MED ORDER — CEFTRIAXONE SODIUM 2 G IJ SOLR
2.0000 g | INTRAMUSCULAR | Status: DC
  Administered 2017-05-05 – 2017-05-07 (×3): 2 g via INTRAVENOUS
  Filled 2017-05-05 (×3): qty 20

## 2017-05-05 MED ORDER — BACLOFEN 5 MG HALF TABLET
5.0000 mg | ORAL_TABLET | Freq: Three times a day (TID) | ORAL | Status: DC
  Administered 2017-05-05 – 2017-05-25 (×62): 5 mg via ORAL
  Filled 2017-05-05: qty 0.5
  Filled 2017-05-05: qty 1
  Filled 2017-05-05 (×2): qty 0.5
  Filled 2017-05-05 (×5): qty 1
  Filled 2017-05-05: qty 0.5
  Filled 2017-05-05 (×2): qty 1
  Filled 2017-05-05 (×2): qty 0.5
  Filled 2017-05-05 (×5): qty 1
  Filled 2017-05-05: qty 0.5
  Filled 2017-05-05 (×6): qty 1
  Filled 2017-05-05: qty 0.5
  Filled 2017-05-05 (×2): qty 1
  Filled 2017-05-05: qty 0.5
  Filled 2017-05-05 (×5): qty 1
  Filled 2017-05-05: qty 0.5
  Filled 2017-05-05 (×6): qty 1
  Filled 2017-05-05 (×2): qty 0.5
  Filled 2017-05-05: qty 1
  Filled 2017-05-05: qty 0.5
  Filled 2017-05-05 (×2): qty 1
  Filled 2017-05-05: qty 0.5
  Filled 2017-05-05 (×5): qty 1
  Filled 2017-05-05: qty 0.5
  Filled 2017-05-05 (×2): qty 1
  Filled 2017-05-05 (×2): qty 0.5
  Filled 2017-05-05 (×9): qty 1
  Filled 2017-05-05: qty 0.5
  Filled 2017-05-05 (×2): qty 1
  Filled 2017-05-05: qty 0.5
  Filled 2017-05-05 (×5): qty 1

## 2017-05-05 MED ORDER — VITAMIN D (ERGOCALCIFEROL) 1.25 MG (50000 UNIT) PO CAPS
50000.0000 [IU] | ORAL_CAPSULE | ORAL | Status: DC
  Administered 2017-05-05 – 2017-05-19 (×3): 50000 [IU] via ORAL
  Filled 2017-05-05 (×4): qty 1

## 2017-05-05 MED ORDER — METOPROLOL SUCCINATE ER 100 MG PO TB24
100.0000 mg | ORAL_TABLET | Freq: Every day | ORAL | Status: DC
  Administered 2017-05-05 – 2017-05-25 (×21): 100 mg via ORAL
  Filled 2017-05-05 (×21): qty 1

## 2017-05-05 MED ORDER — INSULIN GLARGINE 100 UNIT/ML ~~LOC~~ SOLN
23.0000 [IU] | Freq: Every day | SUBCUTANEOUS | Status: DC
  Administered 2017-05-06 – 2017-05-08 (×3): 23 [IU] via SUBCUTANEOUS
  Filled 2017-05-05 (×3): qty 0.23

## 2017-05-05 MED ORDER — SENNOSIDES-DOCUSATE SODIUM 8.6-50 MG PO TABS: 1.0000 | ORAL_TABLET | Freq: Every evening | ORAL | Status: DC | PRN

## 2017-05-05 MED ORDER — SODIUM CHLORIDE 0.9 % IV SOLN
INTRAVENOUS | Status: AC
  Administered 2017-05-05 – 2017-05-06 (×3): via INTRAVENOUS

## 2017-05-05 MED ORDER — INSULIN ASPART 100 UNIT/ML ~~LOC~~ SOLN
5.0000 [IU] | Freq: Once | SUBCUTANEOUS | Status: AC
  Administered 2017-05-05: 5 [IU] via SUBCUTANEOUS
  Filled 2017-05-05: qty 1

## 2017-05-05 MED ORDER — RISPERIDONE 1 MG PO TABS
1.0000 mg | ORAL_TABLET | Freq: Every day | ORAL | Status: DC
  Administered 2017-05-05 – 2017-05-24 (×20): 1 mg via ORAL
  Filled 2017-05-05 (×21): qty 1

## 2017-05-05 MED ORDER — LISINOPRIL 20 MG PO TABS
20.0000 mg | ORAL_TABLET | Freq: Every day | ORAL | Status: DC
  Administered 2017-05-05 – 2017-05-25 (×21): 20 mg via ORAL
  Filled 2017-05-05 (×21): qty 1

## 2017-05-05 MED ORDER — ENOXAPARIN SODIUM 40 MG/0.4ML ~~LOC~~ SOLN
40.0000 mg | SUBCUTANEOUS | Status: DC
  Administered 2017-05-05 – 2017-05-19 (×15): 40 mg via SUBCUTANEOUS
  Filled 2017-05-05 (×15): qty 0.4

## 2017-05-05 MED ORDER — INSULIN GLARGINE 100 UNIT/ML ~~LOC~~ SOLN
12.0000 [IU] | Freq: Every day | SUBCUTANEOUS | Status: AC
  Administered 2017-05-05: 12 [IU] via SUBCUTANEOUS
  Filled 2017-05-05: qty 0.12

## 2017-05-05 MED ORDER — VANCOMYCIN HCL 500 MG IV SOLR
500.0000 mg | Freq: Two times a day (BID) | INTRAVENOUS | Status: DC
  Filled 2017-05-05: qty 500

## 2017-05-05 MED ORDER — INSULIN ASPART 100 UNIT/ML ~~LOC~~ SOLN
0.0000 [IU] | Freq: Three times a day (TID) | SUBCUTANEOUS | Status: DC
  Administered 2017-05-05: 9 [IU] via SUBCUTANEOUS
  Administered 2017-05-05: 2 [IU] via SUBCUTANEOUS
  Administered 2017-05-06: 5 [IU] via SUBCUTANEOUS
  Administered 2017-05-06: 2 [IU] via SUBCUTANEOUS
  Administered 2017-05-06 – 2017-05-07 (×3): 5 [IU] via SUBCUTANEOUS
  Administered 2017-05-07: 3 [IU] via SUBCUTANEOUS
  Administered 2017-05-08 (×2): 5 [IU] via SUBCUTANEOUS
  Administered 2017-05-08 – 2017-05-09 (×2): 2 [IU] via SUBCUTANEOUS
  Administered 2017-05-09 (×2): 5 [IU] via SUBCUTANEOUS
  Administered 2017-05-10: 3 [IU] via SUBCUTANEOUS
  Administered 2017-05-10 – 2017-05-11 (×2): 2 [IU] via SUBCUTANEOUS
  Administered 2017-05-11: 3 [IU] via SUBCUTANEOUS
  Administered 2017-05-11: 1 [IU] via SUBCUTANEOUS
  Administered 2017-05-12: 3 [IU] via SUBCUTANEOUS
  Administered 2017-05-12: 1 [IU] via SUBCUTANEOUS
  Administered 2017-05-12 – 2017-05-13 (×2): 3 [IU] via SUBCUTANEOUS
  Administered 2017-05-13: 1 [IU] via SUBCUTANEOUS
  Administered 2017-05-13: 5 [IU] via SUBCUTANEOUS
  Administered 2017-05-14: 7 [IU] via SUBCUTANEOUS
  Administered 2017-05-14: 5 [IU] via SUBCUTANEOUS
  Administered 2017-05-14: 2 [IU] via SUBCUTANEOUS
  Administered 2017-05-15 (×2): 7 [IU] via SUBCUTANEOUS

## 2017-05-05 MED ORDER — SODIUM CHLORIDE 0.9 % IV SOLN
600.0000 mg | INTRAVENOUS | Status: DC
  Administered 2017-05-05 – 2017-05-07 (×3): 600 mg via INTRAVENOUS
  Filled 2017-05-05 (×3): qty 12

## 2017-05-05 MED ORDER — AMLODIPINE BESYLATE 10 MG PO TABS
10.0000 mg | ORAL_TABLET | Freq: Every day | ORAL | Status: DC
  Administered 2017-05-05 – 2017-05-25 (×21): 10 mg via ORAL
  Filled 2017-05-05 (×15): qty 1
  Filled 2017-05-05: qty 2
  Filled 2017-05-05 (×5): qty 1

## 2017-05-05 MED ORDER — ACETAMINOPHEN 650 MG RE SUPP: 650.0000 mg | Freq: Four times a day (QID) | RECTAL | Status: DC | PRN

## 2017-05-05 MED ORDER — ACETAMINOPHEN 325 MG PO TABS
650.0000 mg | ORAL_TABLET | Freq: Four times a day (QID) | ORAL | Status: DC | PRN
  Administered 2017-05-06 – 2017-05-15 (×13): 650 mg via ORAL
  Filled 2017-05-05 (×14): qty 2

## 2017-05-05 MED ORDER — INSULIN GLARGINE 100 UNIT/ML ~~LOC~~ SOLN
10.0000 [IU] | Freq: Every day | SUBCUTANEOUS | Status: DC
  Administered 2017-05-05: 10 [IU] via SUBCUTANEOUS
  Filled 2017-05-05: qty 0.1

## 2017-05-05 MED ORDER — BOOST PLUS PO LIQD
237.0000 mL | Freq: Three times a day (TID) | ORAL | Status: DC
  Administered 2017-05-05 – 2017-05-20 (×25): 237 mL via ORAL
  Filled 2017-05-05 (×68): qty 237

## 2017-05-05 MED ORDER — BETHANECHOL CHLORIDE 25 MG PO TABS
50.0000 mg | ORAL_TABLET | Freq: Three times a day (TID) | ORAL | Status: DC
  Administered 2017-05-05 – 2017-05-25 (×61): 50 mg via ORAL
  Filled 2017-05-05 (×64): qty 2

## 2017-05-05 MED ORDER — OXYCODONE HCL 5 MG PO TABS
5.0000 mg | ORAL_TABLET | Freq: Three times a day (TID) | ORAL | Status: DC | PRN
  Administered 2017-05-05 – 2017-05-18 (×29): 5 mg via ORAL
  Filled 2017-05-05 (×33): qty 1

## 2017-05-05 MED ORDER — VANCOMYCIN HCL IN DEXTROSE 1-5 GM/200ML-% IV SOLN
1000.0000 mg | Freq: Once | INTRAVENOUS | Status: DC
  Filled 2017-05-05: qty 200

## 2017-05-05 MED ORDER — TAMSULOSIN HCL 0.4 MG PO CAPS
0.4000 mg | ORAL_CAPSULE | Freq: Every day | ORAL | Status: DC
  Administered 2017-05-05 – 2017-05-25 (×21): 0.4 mg via ORAL
  Filled 2017-05-05 (×22): qty 1

## 2017-05-05 NOTE — Progress Notes (Addendum)
Pharmacy Antibiotic Note  Debra [name] is a 52 y.o. female admitted on 05/05/2017 with bacteremia. Patient has gm positive cocci and gm negative rods in blood culture  Pharmacy has been consulted for vancomycin/ceftriaxone dosing.WBC 10.8 and patient afebrile. SCr-1.32 with CrCl ~ 46 ml/min.  Plan: Vancomycin 1000 mg x 1 then 500 mg every 12 hours  Ceftriaxone 2 gm every 24 hours  Monitor renal function and cultures  Addendum: Patient with VRE in blood Switch vanco to daptomycin 600 mg every 24 hours (~ 10 mg/kg)  Weekly CK  Height: 5\' 5"  (165.1 cm) Weight: 138 lb 10.7 oz (62.9 kg) IBW/kg (Calculated) : 57  Temp (24hrs), Avg:98.9 F (37.2 C), Min:98 F (36.7 C), Max:99.7 F (37.6 C)  Recent Labs  Lab 05/05/17 0220 05/05/17 0300 05/05/17 0843  WBC 10.8*  --   --   CREATININE 1.32*  --   --   LATICACIDVEN  --  2.03* 1.4    Estimated Creatinine Clearance: 45.9 mL/min (A) (by C-G formula based on SCr of 1.32 mg/dL (H)).    Allergies  Allergen Reactions  . Penicillins Anaphylaxis, Nausea And Vomiting and Rash    Has patient had a PCN reaction causing immediate rash, facial/tongue/throat swelling, SOB or lightheadedness with hypotension: Yes Has patient had a PCN reaction causing severe rash involving mucus membranes or skin necrosis: Yes Has patient had a PCN reaction that required hospitalization No Has patient had a PCN reaction occurring within the last 10 years: Yes If all of the above answers are "NO", then may proceed with Cephalosporin use.   . Pollen Extract Other (See Comments)    Seasonal Allergies  . Tape Rash    Antimicrobials this admission: 3/9 Vanc>>3/9 3/9 Dapto >> 3/9 Ceftriaxone>>   Thank you for allowing pharmacy to be a part of this patient's care.  Sharin Mons, PharmD, BCPS PGY2 Infectious Diseases Pharmacy Resident Pager: [phone]  05/05/2017 7:19 PM

## 2017-05-05 NOTE — Progress Notes (Addendum)
   Subjective:  Patient re-iterates that she has not felt feverish, chills, dysuria, change in bowel habits, N/V. She states she has had a wound on her sacrum for about the past week which is painful but stable.   Objective:  Vital signs in last 24 hours: Vitals:   05/05/17 0600 05/05/17 0606 05/05/17 0615 05/05/17 0656  BP: 133/61 133/61 125/67 120/74  Pulse: 83 87 85 83  Resp: 18 17  18   Temp:    99.7 F (37.6 C)  TempSrc:    Oral  SpO2: 98% 100% 99% 99%  Weight:    138 lb 10.7 oz (62.9 kg)  Height:       Constitutional: NAD CV: RRR Resp: CTAB on anterior lung fields, no increased work of breathing (Dr. Mikey Bussing auscultated posterior lung fields) Abd: soft, NDNT, +BS Neuro: CN 2-12 grossly intact; LE strength R 3/5, L 2/5, no dorsi or plantarflexion in either lower extremity Skin: ~ 5x4cm malodorous ulcer with what appears to be foam dressing; no drainage, no surrounding erythema or induration  Psych: flat affect  Assessment/Plan:  Principal Problem:   Hyperglycemia Active Problems:   Type 1 diabetes mellitus (HCC)   Pressure injury of skin   Bacteriuria  Hyperglycemia T1DM with evidence of insulin resistance: Outpt regimen has been adjusted due to reported lower CBGs; at last discharge was on 30 units of Lantus, 8 units of novolog with meals +SSI. Etiology for recently uncontrolled CBGs could be 2/2 decrease in insulin and changes in PO intake; certainly infection could be implicating her hyperglycemia as well - this is discussed below. --already received 10u lantus this AM; will add on 13 units lantus tonight and resume 23 units qhs tomorrow --novolog SSI for now; will adjust scheduled mealtime coverage based on CBGs  Bacteriuria: Patient with bacteriuria w/o other s/sx of UTI on admission. Admission urine sample was collected from already indwelling foley and analysis may represent colonization of foley itself. Will have nursing change cath and repeat UA and based on  this result will consider starting antibiotic coverage. At this time she is HD stable w/o acute indication for abx. --Change out foley and f/u repeat UA --follow fever curve, WBC --UCx pending from first sample; BCx sent from ED  Sacral pressure injury: Patient with sacral pressure injury which she reports has been present for 1 week, however based on size and fact that it appears nursing has placed foam dressing, I suspect this has been an ongoing issue. On our exam there was no drainage or surrounding erythema/induration to suggest acute infection of the ulcer.  --f/u wound care consult recs --f/u fever curve, WBC  Multiple sclerosis: Patient with h/o aggressive MS w/o signs of current flare. She was supposed to be on Rituxin therapy for maintenance, however this does not appear to have been done. Will try to reach out to SNF to evaluate reason as we had plans to have patient transferred to short stay for her infusions every 2 weeks and have her f/u with neurology outpatient.  --continue muscle relaxants --foley for neurogenic bladder --PT/OT consults for continued rehabilitation  Dispo: Anticipated discharge in approximately 2-3 day(s) back to SNF.   Nyra Market, MD 05/05/2017, 11:38 AM IMTS - PGY2 Pager [phone]

## 2017-05-05 NOTE — ED Triage Notes (Signed)
Pt arrives from [facility] via [facility], called by nursing home staff. Pt's CBG reported as 559 at 2330, pt received 10 units Novolog insulin at 000, CBG measured as 502 at 0100 and 467 en route to hospital (approx 0200). Pt arrives with indwelling foley and bandaged pressure ulcer on tailbone  EMS vitals: 124/55; HR 72; RR18, IV: 20 in R forearm

## 2017-05-05 NOTE — H&P (Addendum)
Date: 05/05/2017               Patient Name:  Debra [name] MRN: [id number]  DOB: 12-06-1965 Age / Sex: 52 y.o., female   PCP: [name], MD         Medical Service: Internal Medicine Teaching Service         Attending Physician: Dr. Gust Rung, DO    First Contact: Dr. Evelene Croon Pager: [phone]  Second Contact: Dr. Samuella Cota Pager: [phone]       After Hours (After 5p/  First Contact Pager: [phone]  weekends / holidays): Second Contact Pager: [phone]   Chief Complaint: high blood sugar  History of Present Illness:  Ms. Padgett is a 52yo female with PMH of diabetes, MS resulting in neurogenic bladder (with chronic indwelling foley), HTN, CKD III, recurrent UTIs and depression who presents from her nursing facility with elevated blood sugar. She was recently admitted from 2/14-2/26 for sepsis secondary to Proteus bacteremia, VRE UTI, requiring intubation 2/15-2/19 and pressors, as well as C. Diff infection. She was treated with ceftriaxone, bactrim, and linezolid for Proteus bacteremia and VRE UTI, as well as PO vancomycin for C. Diff.  The staff at her nursing facility noted that her blood sugars were more elevated than normal and were not controlled with increased doses of insulin, so she was brought to the emergency room. She denies feeling any different than her usual self. She does have a painful wound on her tailbone that has been present for the last 1-2 weeks. She has not received any antibiotics for the wound, but has been getting it dressed and cared for at the facility. She also notes decreased PO intake recently. She denies dysuria or other urinary symptoms. She states her foley was last replaced about 1 month ago. She denies recent illness. She denies CP, SOB, fevers, abdominal pain, and nausea.  Per chart review, she was diagnosed with MS in 10/2016 after an admission for acute onset BLE weakness and frequent falls, now unable to ambulate and associated with acute  urinary retention, ultimately requiring chronic foley placement. She had started rituximab infusions for maintenance in Nov 2018. Plan was to follow up with Neurology (Dr. Epimenio Foot) for a second infusion in Dec 2018 and then a third infusion in about 6 months, however there are no records of follow up in our chart. She has been in and out of the hospital multiple times in the last 6 months. Foley last changed 2/14.   ED Course: - BP 169/107, HR 79, RR 17, temp 99.2, O2 100% on RA - WBC 10.8, Hb 9.4. Lactic acid 2.03. Venous pH 7.35. Na 134, K 3.4, gluc 337, Cr 1.32. UA with large Hgb, too numerous to count RBC, too numerous to count WBC, large leukocytes, many bacteria, 0-5 squam epithelial cells  Meds:  Current Meds  Medication Sig  . acetaminophen (TYLENOL) 325 MG tablet Take 650 mg by mouth every 4 (four) hours as needed for mild pain or fever.   Marland Kitchen amLODipine (NORVASC) 10 MG tablet Take 1 tablet (10 mg total) by mouth daily.  Marland Kitchen aspirin EC 81 MG tablet Take 1 tablet (81 mg total) by mouth daily.  Marland Kitchen atorvastatin (LIPITOR) 40 MG tablet Take 1 tablet (40 mg total) by mouth daily.  . Baclofen 5 MG TABS Take 5 mg by mouth 3 (three) times daily.  . bethanechol (URECHOLINE) 50 MG tablet Take 1 tablet (50 mg total) by mouth 3 (three)  times daily.  Marland Kitchen GLUCAGON HCL, RDNA, IJ Inject 1 mg into the muscle as needed (hypoglycemia-administer for CBG <60).  . insulin aspart (NOVOLOG) 100 UNIT/ML injection Inject 2-10 Units into the skin 3 (three) times daily before meals. 150-200=2 units 201-250=4 units 241-300=6 units 301-350=8 units 351-400=10 units  . insulin glargine (LANTUS) 100 UNIT/ML injection Inject 0.23 mLs (23 Units total) into the skin daily.  Marland Kitchen lidocaine (LIDODERM) 5 % Place 1 patch onto the skin daily. FOR LOWER BACK PAIN Remove & Discard patch within 12 hours or as directed by MD  . lisinopril (PRINIVIL,ZESTRIL) 20 MG tablet Take 1 tablet (20 mg total) by mouth daily.  Marland Kitchen loperamide (IMODIUM) 2  MG capsule Take 2 capsules (4 mg total) by mouth as needed for diarrhea or loose stools.  . magnesium hydroxide (MILK OF MAGNESIA) 400 MG/5ML suspension Take 30 mLs by mouth daily as needed for mild constipation.  . meclizine (ANTIVERT) 12.5 MG tablet Take 12.5 mg by mouth See admin instructions. 12.5mg  by mouth as needed for dizziness one hour before therapy  . metoprolol succinate (TOPROL-XL) 100 MG 24 hr tablet Take 1 tablet (100 mg total) by mouth daily. Take with or immediately following a meal.  . mirtazapine (REMERON) 45 MG tablet Take 1 tablet (45 mg total) by mouth at bedtime.  . ondansetron (ZOFRAN) 4 MG tablet Take 4 mg by mouth daily.  Marland Kitchen oxyCODONE (OXY IR/ROXICODONE) 5 MG immediate release tablet Take 5 mg by mouth every 8 (eight) hours as needed for severe pain.  . pantoprazole (PROTONIX) 40 MG tablet Take 1 tablet (40 mg total) by mouth daily.  . polyethylene glycol (MIRALAX / GLYCOLAX) packet Take 17 g by mouth daily as needed for mild constipation, moderate constipation or severe constipation.  . risperiDONE (RISPERDAL) 1 MG tablet Take 1 tablet (1 mg total) at bedtime by mouth.  . tamsulosin (FLOMAX) 0.4 MG CAPS capsule Take 1 capsule (0.4 mg total) daily by mouth.  . Vitamin D, Ergocalciferol, (DRISDOL) 50000 units CAPS capsule Take 1 capsule (50,000 Units total) by mouth every 7 (seven) days. (Patient taking differently: Take 50,000 Units by mouth every 7 (seven) days. On Thursdays)  . zolpidem (AMBIEN) 5 MG tablet Take 1 tablet (5 mg total) by mouth at bedtime as needed for sleep.   Allergies: Allergies as of 05/05/2017 - Review Complete 05/05/2017  Allergen Reaction Noted  . Penicillins Anaphylaxis, Nausea And Vomiting, and Rash 07/06/2010  . Pollen extract Other (See Comments) 11/15/2016  . Tape Rash 02/24/2016   Past Medical History:  Diagnosis Date  . Adenomatous colonic polyps   . Anemia    2005  . Anxiety    1990  . Arthritis   . Asthma    2000  . Cataract     . Depression with psychotic symptoms   . Difficult intubation    narrow airway  . Gastroesophageal Reflux Disease (GERD)   . Heart murmur    Birth  . Hyperlipidemia    2005  . Hypertension    1998  . Internal hemorrhoids 04/24/06   on colonoscopy  . Migraine   . Multiple sclerosis (HCC)   . Neuropathic bladder   . Neuropathy of the hands & feet   . Restless Leg Syndrome   . Right Ankle Fracture 10/06/2013  . Sleep paralysis   . Stable angina pectoris    2007: cath showing normal cors.   . Stroke 1990  . Type I Diabetes Mellitus 1988   Family  History:  Family History  Problem Relation Age of Onset  . Hypertension Mother   . Kidney disease Mother   . Hypertension Father   . Breast cancer Maternal Grandmother   . Prostate cancer Maternal Grandfather   . Ovarian cancer Paternal Grandmother   . Prostate cancer Paternal Grandfather   . Colon cancer Maternal Uncle        Family history of malignant neoplasm of gastrointestinal tract   Social History:  Social History   Socioeconomic History  . Marital status: Divorced    Spouse name: None  . Number of children: 2  . Years of education: 69  . Highest education level: None  Social Needs  . Financial resource strain: None  . Food insecurity - worry: None  . Food insecurity - inability: None  . Transportation needs - medical: None  . Transportation needs - non-medical: None  Occupational History  . Occupation: Unemployed  Tobacco Use  . Smoking status: Never Smoker  . Smokeless tobacco: Never Used  Substance and Sexual Activity  . Alcohol use: No    Alcohol/week: 0.0 oz  . Drug use: No    Comment: drug addict  . Sexual activity: Yes    Birth control/protection: Other-see comments    Comment: pt states she had ablation in 2011  Other Topics Concern  . None  Social History Narrative   Lost medicaid about 2009 ish when her youngest child turned 89. Has adult children. Lives with boyfriend who financially supports  her. Has attempted to get disability but has been turned down.      2-3 caffeine drinks daily    Review of Systems: A complete ROS was negative except as per HPI.  Physical Exam: Blood pressure (!) 148/80, pulse 84, temperature 99.2 F (37.3 C), temperature source Rectal, resp. rate (!) 22, height 5\' 5"  (1.651 m), weight 146 lb (66.2 kg), SpO2 100 %.  GEN: Appears stated age. Alert and oriented. No acute distress. HENT: [place]/AT. Moist mucous membranes. No visible lesions. EYES: PERRL. Sclera non-icteric. Conjunctiva clear. RESP: Clear to auscultation bilaterally. No wheezes, rales, or rhonchi. No increased work of breathing. CV: Normal rate and regular rhythm. Systolic murmur best heard at RUSB. No carotid bruits. No LE edema. ABD: Soft. Non-tender. Non-distended. Normoactive bowel sounds. EXT: No edema. SKIN: Warm to the touch and diaphoretic. BACK: Sacral wound with overlying dressing. Large superficial ulcer with clean base. NEURO: Cranial nerves II-XII grossly intact. Moving upper extremities spontaneously. No apparent audiovisual hallucinations. Speech fluent and appropriate. PSYCH: Patient is calm and pleasant. Flat affect. Depressed mood. Well-groomed; speech is appropriate and on-subject.  Assessment & Plan by Problem: Active Problems:   Hyperglycemia  Ms. Debenedetto is a 52yo female with PMH of diabetes, MS resulting in neurogenic bladder (with chronic indwelling foley), HTN, CKD III, recurrent UTIs, depression, and recent admission for Proteus bacteremia, UTI, and C. Diff infection who presents from her nursing facility with elevated blood sugar. Labs do not suggest DKA/HHS. Also noted to have bacteriuria in the setting of chronic indwelling catheter. She does have a mild elevation in WBC and elevated lactic acid. However, she denies urinary symptoms and otherwise feels like her normal self, and she is afebrile and HDS. Thus, will hold off on antibiotic therapy for now and continue to  monitor.  Bacteriuria (with chronic indwelling catheter) Lactic acidosis Hx of Proteus bacteremia and VRE UTI UA suggests UTI, however patient is afebrile and otherwise HDS. Mild elevation in WBC and lactic acidosis. However,  she denies urinary symptoms or feeling any different than usual. Bacteria may represent colonization and thus will hold off on antibiotic therapy for now until culture results return or unless she clinically worsens. She does have a history of recurrent UTIs, including VRE, Proteus, E. Coli, and Klebsiella. - Admit to med-surg - Monitor fever curve - Trend lactic acid - IV NS 139mL/hr x24h - F/u UCx - F/u BCx - CBC in AM  Sacral pressure injury Patient reports this has been present for 1-2 weeks, however it appears that wound care was consulted for a sacral wound during her previous admission. Has not received antibiotics, but wound has been cared for by the nursing facility staff. Will check CRP. - Wound care consult - F/u CRP  Hyperglycemia Diabetes Glucose 311 -> 281 on admission. No evidence of DKA/HHS. Last A1c 10.3 in 03/2017. She had recurrent hypoglycemia during her previous admission and was discharged with a decreased dose of lantus at 23u QHS. Also on Novolog 2-10u TID with meals. - Lantus 10u QHS - Novolog 5u x1 dose - SSI-S TID WC - CBG monitoring  HTN Diastolic dysfunction (TTE 03/2017: LVEF 55-60%, grade 1 DD) BP well controlled in ED 129/64. Appears euvolemic on exam. Home regimen includes amlodipine 10mg  daily, lisinopril 20mg  daily, and metoprolol 100mg  daily. - Continue home amlodipine, lisinopril, and metoprolol  MS with neurogenic bladder (with chronic indwelling foley) Diagnosed 10/2016 after admission for acute onset BLE weakness and frequent falls, now unable to ambulate and associated with acute urinary retention, ultimately requiring chronic foley placement.  Foley last changed 2/14. On baclofen and bethanechol. She had started rituximab  infusions for maintenance in Nov 2018. Plan was to follow up with Neurology (Dr. Epimenio Foot) for a second infusion in Dec 2018 and then a third infusion in about 6 months, however there are no records of follow up in our chart. - Continue home vitamin D 50000u q7d (on Thursdays) - Continue home baclofen 5mg  TID, bethanechol 50mg  TID, and tamsulosin 0.4mg  daily  CKD III Cr 1.32 on admission, baseline ~1.1-1.5. - Avoid nephrotoxic agents - BMP in AM  Depression Hx of severe depression with psychotic features and recent inpatient psychiatric admission due to suicidal ideation. On risperidone - Continue home risperidone 1mg  QHS  Diet: CM VTE PPx: Lovenox Code Status: Full code Dispo: Admit patient to Inpatient with expected length of stay greater than 2 midnights.  Signed: Scherrie Gerlach, MD 05/05/2017, 5:53 AM  Pager: Demetrius Charity [phone]

## 2017-05-05 NOTE — ED Provider Notes (Signed)
[facility] EMERGENCY DEPARTMENT Provider Note   CSN: [id number] Arrival date & time: 05/05/17  0209     History   Chief Complaint Chief Complaint  Patient presents with  . Hyperglycemia    HPI Tasmin Exantus is a 52 y.o. female.  Patient presents to the emergency department for evaluation of elevated blood sugar.  She arrives from nursing home.  Patient reports that her sugars have been running high all day.  She reports that she feels like her normal self, however, denies any recent illness.  She has not had cough, chest congestion, nausea, vomiting, diarrhea or fever today.  She denies chest pain, abdominal pain and shortness of breath.      Past Medical History:  Diagnosis Date  . Adenomatous colonic polyps   . Anemia    2005  . Anxiety    1990  . Arthritis   . Asthma    2000  . Cataract   . Depression with psychotic symptoms   . Difficult intubation    narrow airway  . Gastroesophageal Reflux Disease (GERD)   . Heart murmur    Birth  . Hyperlipidemia    2005  . Hypertension    1998  . Internal hemorrhoids 04/24/06   on colonoscopy  . Migraine   . Multiple sclerosis (HCC)   . Neuropathic bladder   . Neuropathy of the hands & feet   . Restless Leg Syndrome   . Right Ankle Fracture 10/06/2013  . Sleep paralysis   . Stable angina pectoris    2007: cath showing normal cors.   . Stroke 1990  . Type I Diabetes Mellitus 1988    Patient Active Problem List   Diagnosis Date Noted  . Recurrent UTI   . Palliative care encounter   . Goals of care, counseling/discussion   . Acute respiratory failure (HCC)   . Pressure injury of skin 04/14/2017  . At high risk for aspiration   . Endotracheally intubated   . Pneumonia due to infectious organism   . Sepsis due to urinary tract infection (HCC) 04/12/2017  . Acute encephalopathy 03/15/2017  . Aortic atherosclerosis (HCC) 03/14/2017  . Altered mental status 03/14/2017  . Urinary tract  infection 03/14/2017  . Major depressive disorder, single episode, severe without psychosis (HCC) 01/02/2017  . Multiple sclerosis exacerbation (HCC) 01/01/2017  . Dysthymia 12/27/2016  . Major depressive disorder, recurrent, severe with psychotic features (HCC)   . Multiple joint pain   . HTN (hypertension)   . Acute pain of left knee   . Neurogenic bladder   . Acute lower UTI   . Labile blood pressure   . Acute blood loss anemia   . Leukocytosis   . Labile blood glucose   . Hypertensive crisis   . Hypoglycemia   . Poorly controlled type 2 diabetes mellitus with peripheral neuropathy (HCC)   . Uncontrolled hypertension   . Sleep disturbance   . Multiple sclerosis (HCC) 11/22/2016  . Thoracic root lesion 11/22/2016  . MS (multiple sclerosis) (HCC)   . Neuropathic pain   . Uncontrolled type 2 diabetes mellitus with peripheral neuropathy (HCC)   . Urinary retention   . Medically noncompliant   . Leg weakness, bilateral 11/20/2016  . Diabetic ketoacidosis without coma associated with type 1 diabetes mellitus (HCC)   . Leg pain   . History of CVA (cerebrovascular accident)   . Uncontrolled type 1 diabetes mellitus with diabetic peripheral neuropathy (HCC)   . Benign  essential HTN   . Hypertensive urgency   . AKI (acute kidney injury) (HCC)   . Fibromyalgia 11/07/2016  . Back pain 10/31/2016  . Stable angina pectoris (HCC)   . Vitamin D deficiency 08/31/2014  . 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 Hyperlipidemia 10/06/2013  . Depression 10/16/2012  . Bilateral knee pain 02/16/2012  . Chronic diarrhea 10/19/2010  . Peripheral neuropathy 08/19/2010  . Type 1 diabetes mellitus (HCC) 07/06/2010  . Hypertension 07/06/2010  . Asthma 07/06/2010  . Migraine 07/06/2010    Past Surgical History:  Procedure Laterality Date  . bilateral foot surgery    . BREAST SURGERY Left    biopsy left breast  . CARDIAC CATHETERIZATION  2007   around 2007 or  2008  . CESAREAN SECTION    . CHOLECYSTECTOMY    . COLONOSCOPY W/ BIOPSIES AND POLYPECTOMY    . DILATION AND CURETTAGE OF UTERUS  12/29/2010   Surgeon: Tereso Newcomer, MD;  Location: [facility];  Service: Gynecology  . ENDOMETRIAL ABLATION W/ NOVASURE N/A 12/2009  . EYE SURGERY    . HYSTEROSCOPY  12/29/2010   Procedure: HYSTEROSCOPY WITH HYDROTHERMAL ABLATION;  Surgeon: Tereso Newcomer, MD;  Location: [facility];  Service: Gynecology;;  . IUD REMOVAL  12/29/2010   Procedure: INTRAUTERINE DEVICE (IUD) REMOVAL;  Surgeon: Tereso Newcomer, MD;  Location: [facility];  Service: Gynecology;  Laterality: N/A;  . LAPAROSCOPIC TUBAL LIGATION  12/29/2010   Procedure: LAPAROSCOPIC TUBAL LIGATION;  Surgeon: Tereso Newcomer, MD;  Location: [facility];  Service: Gynecology;  Laterality: Bilateral;  . ORIF ANKLE FRACTURE Right 10/09/2013   Procedure: Open reduction internal fixation right ankle;  Surgeon: Mable Paris, MD;  Location: [facility] OR;  Service: Orthopedics;  Laterality: Right;  Open reduction internal fixation right ankle  . TRIGGER FINGER RELEASE     x 3    OB History    Gravida Para Term Preterm AB Living   2 2 1 1  0 2   SAB TAB Ectopic Multiple Live Births   0 0 0 0 2       Home Medications    Prior to Admission medications   Medication Sig Start Date End Date Taking? Authorizing Provider  acetaminophen (TYLENOL) 325 MG tablet Take 650 mg by mouth every 4 (four) hours as needed for mild pain or fever.    Yes [provider]  amLODipine (NORVASC) 10 MG tablet Take 1 tablet (10 mg total) by mouth daily. 02/14/17 02/14/18 Yes Angelita Ingles, MD  aspirin EC 81 MG tablet Take 1 tablet (81 mg total) by mouth daily. 02/14/17  Yes Angelita Ingles, MD  atorvastatin (LIPITOR) 40 MG tablet Take 1 tablet (40 mg total) by mouth daily. 02/14/17  Yes Angelita Ingles, MD  Baclofen 5 MG TABS Take 5 mg by mouth 3 (three) times daily. 04/24/17  Yes Narda Bonds, MD  bethanechol  (URECHOLINE) 50 MG tablet Take 1 tablet (50 mg total) by mouth 3 (three) times daily. 02/14/17  Yes Winfrey, Kimberlee Nearing, MD  GLUCAGON HCL, RDNA, IJ Inject 1 mg into the muscle as needed (hypoglycemia-administer for CBG <60).   Yes [provider]  insulin aspart (NOVOLOG) 100 UNIT/ML injection Inject 2-10 Units into the skin 3 (three) times daily before meals. 150-200=2 units 201-250=4 units 241-300=6 units 301-350=8 units 351-400=10 units   Yes [provider]  insulin glargine (LANTUS) 100 UNIT/ML injection Inject 0.23 mLs (23 Units total)  into the skin daily. 04/25/17  Yes Narda Bonds, MD  lidocaine (LIDODERM) 5 % Place 1 patch onto the skin daily. FOR LOWER BACK PAIN Remove & Discard patch within 12 hours or as directed by MD   Yes [provider]  lisinopril (PRINIVIL,ZESTRIL) 20 MG tablet Take 1 tablet (20 mg total) by mouth daily. 02/15/17  Yes Angelita Ingles, MD  loperamide (IMODIUM) 2 MG capsule Take 2 capsules (4 mg total) by mouth as needed for diarrhea or loose stools. 04/24/17  Yes Narda Bonds, MD  magnesium hydroxide (MILK OF MAGNESIA) 400 MG/5ML suspension Take 30 mLs by mouth daily as needed for mild constipation. 02/14/17  Yes Angelita Ingles, MD  meclizine (ANTIVERT) 12.5 MG tablet Take 12.5 mg by mouth See admin instructions. 12.5mg  by mouth as needed for dizziness one hour before therapy   Yes [provider]  metoprolol succinate (TOPROL-XL) 100 MG 24 hr tablet Take 1 tablet (100 mg total) by mouth daily. Take with or immediately following a meal. 02/15/17  Yes Winfrey, Kimberlee Nearing, MD  mirtazapine (REMERON) 45 MG tablet Take 1 tablet (45 mg total) by mouth at bedtime. 04/24/17  Yes Narda Bonds, MD  ondansetron (ZOFRAN) 4 MG tablet Take 4 mg by mouth daily.   Yes [provider]  oxyCODONE (OXY IR/ROXICODONE) 5 MG immediate release tablet Take 5 mg by mouth every 8 (eight) hours as needed for severe pain.   Yes [provider]  pantoprazole (PROTONIX) 40 MG tablet Take 1 tablet (40 mg total) by mouth daily. 02/15/17  Yes Angelita Ingles, MD  polyethylene glycol (MIRALAX / GLYCOLAX) packet Take 17 g by mouth daily as needed for mild constipation, moderate constipation or severe constipation. 02/14/17  Yes Angelita Ingles, MD  risperiDONE (RISPERDAL) 1 MG tablet Take 1 tablet (1 mg total) at bedtime by mouth. 01/02/17  Yes Gouru, Deanna Artis, MD  tamsulosin (FLOMAX) 0.4 MG CAPS capsule Take 1 capsule (0.4 mg total) daily by mouth. 01/03/17  Yes Gouru, Deanna Artis, MD  Vitamin D, Ergocalciferol, (DRISDOL) 50000 units CAPS capsule Take 1 capsule (50,000 Units total) by mouth every 7 (seven) days. Patient taking differently: Take 50,000 Units by mouth every 7 (seven) days. On Thursdays 02/15/17  Yes Angelita Ingles, MD  zolpidem (AMBIEN) 5 MG tablet Take 1 tablet (5 mg total) by mouth at bedtime as needed for sleep. 04/24/17  Yes Narda Bonds, MD  feeding supplement, GLUCERNA SHAKE, (GLUCERNA SHAKE) LIQD Take 237 mLs by mouth 2 (two) times daily between meals. Patient not taking: Reported on 05/05/2017 02/14/17   Angelita Ingles, MD  protein supplement shake (PREMIER PROTEIN) LIQD Take 325 mLs (11 oz total) by mouth 2 (two) times daily between meals. Patient not taking: Reported on 05/05/2017 04/24/17   Narda Bonds, MD  traMADol (ULTRAM) 50 MG tablet Take 1 tablet (50 mg total) by mouth every 6 (six) hours as needed for moderate pain or severe pain. Patient not taking: Reported on 05/05/2017 04/24/17   Narda Bonds, MD    Family History Family History  Problem Relation Age of Onset  . Hypertension Mother   . Kidney disease Mother   . Hypertension Father   . Breast cancer Maternal Grandmother   . Prostate cancer Maternal Grandfather   . Ovarian cancer Paternal Grandmother   . Prostate cancer Paternal Grandfather   . Colon cancer Maternal Uncle        Family history of malignant  neoplasm of  gastrointestinal tract    Social History Social History   Tobacco Use  . Smoking status: Never Smoker  . Smokeless tobacco: Never Used  Substance Use Topics  . Alcohol use: No    Alcohol/week: 0.0 oz  . Drug use: No    Comment: drug addict     Allergies   Penicillins; Pollen extract; and Tape   Review of Systems Review of Systems  Respiratory: Negative for shortness of breath.   Cardiovascular: Negative for chest pain.  Skin: Positive for wound (Chronic sacral).     Physical Exam Updated Vital Signs BP 118/67   Pulse 74   Temp 99.2 F (37.3 C) (Rectal)   Resp 16   Ht 5\' 5"  (1.651 m)   Wt 66.2 kg (146 lb)   SpO2 100%   BMI 24.30 kg/m   Physical Exam  Constitutional: She is oriented to person, place, and time. She appears well-developed and well-nourished. No distress.  HENT:  Head: Normocephalic and atraumatic.  Right Ear: Hearing normal.  Left Ear: Hearing normal.  Nose: Nose normal.  Mouth/Throat: Oropharynx is clear and moist and mucous membranes are normal.  Eyes: Conjunctivae and EOM are normal. Pupils are equal, round, and reactive to light.  Neck: Normal range of motion. Neck supple.  Cardiovascular: Regular rhythm, S1 normal and S2 normal. Exam reveals no gallop and no friction rub.  No murmur heard. Pulmonary/Chest: Effort normal and breath sounds normal. No respiratory distress. She exhibits no tenderness.  Abdominal: Soft. Normal appearance and bowel sounds are normal. There is no hepatosplenomegaly. There is no tenderness. There is no rebound, no guarding, no tenderness at McBurney's point and negative Murphy's sign. No hernia.  Musculoskeletal: Normal range of motion.  Neurological: She is alert and oriented to person, place, and time. She has normal strength. No cranial nerve deficit or sensory deficit. Coordination normal. GCS eye subscore is 4. GCS verbal subscore is 5. GCS motor subscore is 6.  Skin: Skin is warm, dry and intact. No rash  noted. No cyanosis.  Left sacral wound with dressing intact.  Wound does not have any purulent drainage, surrounding erythema or induration  Psychiatric: She has a normal mood and affect. Her speech is normal and behavior is normal. Thought content normal.  Nursing note and vitals reviewed.    ED Treatments / Results  Labs (all labs ordered are listed, but only abnormal results are displayed) Labs Reviewed  CBC WITH DIFFERENTIAL/PLATELET - Abnormal; Notable for the following components:      Result Value   WBC 10.8 (*)    RBC 3.31 (*)    Hemoglobin 9.4 (*)    HCT 29.1 (*)    Neutro Abs 7.9 (*)    All other components within normal limits  COMPREHENSIVE METABOLIC PANEL - Abnormal; Notable for the following components:   Sodium 134 (*)    Potassium 3.4 (*)    Chloride 98 (*)    Glucose, Bld 337 (*)    Creatinine, Ser 1.32 (*)    Calcium 8.4 (*)    Total Protein 5.7 (*)    Albumin 2.2 (*)    AST 14 (*)    ALT 12 (*)    GFR calc non Af Amer 46 (*)    GFR calc Af Amer 53 (*)    All other components within normal limits  URINALYSIS, ROUTINE W REFLEX MICROSCOPIC - Abnormal; Notable for the following components:   APPearance TURBID (*)    Glucose, UA >=  500 (*)    Hgb urine dipstick LARGE (*)    Protein, ur 30 (*)    Leukocytes, UA LARGE (*)    Bacteria, UA MANY (*)    Squamous Epithelial / LPF 0-5 (*)    All other components within normal limits  CBG MONITORING, ED - Abnormal; Notable for the following components:   Glucose-Capillary 311 (*)    All other components within normal limits  I-STAT CG4 LACTIC ACID, ED - Abnormal; Notable for the following components:   Lactic Acid, Venous 2.03 (*)    All other components within normal limits  I-STAT VENOUS BLOOD GAS, ED - Abnormal; Notable for the following components:   pO2, Ven 25.0 (*)    Bicarbonate 28.3 (*)    All other components within normal limits  CBG MONITORING, ED - Abnormal; Notable for the following components:    Glucose-Capillary 281 (*)    All other components within normal limits  CULTURE, BLOOD (ROUTINE X 2)  CULTURE, BLOOD (ROUTINE X 2)  URINE CULTURE    EKG  EKG Interpretation None       Radiology No results found.  Procedures Procedures (including critical care time)  Medications Ordered in ED Medications - No data to display   Initial Impression / Assessment and Plan / ED Course  I have reviewed the triage vital signs and the nursing notes.  Pertinent labs & imaging results that were available during my care of the patient were reviewed by me and considered in my medical decision making (see chart for details).     Patient with complicated recent history.  Patient has a history of severe MS with recent hospitalization for sepsis secondary to Proteus bacteremia and UTI.  She was sent to the ER today for evaluation of persistently elevated blood sugar.  He does not have any evidence of DKA.  She has not febrile, vital signs are stable.  No evidence of sepsis at this time, however urinalysis still looks grossly infected.  Micro from previous urinalysis was VRE and Proteus mirabilis.  Cannot tell if this is colonization, however she became profoundly sick with urinary tract infection within the last month and therefore will require initiation of treatment while awaiting cultures.  Final Clinical Impressions(s) / ED Diagnoses   Final diagnoses:  Hyperglycemia  Urinary tract infection without hematuria, site unspecified    ED Discharge Orders    None       Pollina, Canary Brim, MD 05/05/17 0422

## 2017-05-05 NOTE — Progress Notes (Signed)
PHARMACY - PHYSICIAN COMMUNICATION CRITICAL VALUE ALERT - BLOOD CULTURE IDENTIFICATION (BCID)  Results for orders placed or performed during the hospital encounter of 05/05/17  Blood Culture ID Panel (Reflexed) (Collected: 05/05/2017  2:50 AM)  Result Value Ref Range   Enterococcus species DETECTED (A) NOT DETECTED   Vancomycin resistance DETECTED (A) NOT DETECTED   Listeria monocytogenes NOT DETECTED NOT DETECTED   Staphylococcus species NOT DETECTED NOT DETECTED   Staphylococcus aureus NOT DETECTED NOT DETECTED   Streptococcus species NOT DETECTED NOT DETECTED   Streptococcus agalactiae NOT DETECTED NOT DETECTED   Streptococcus pneumoniae NOT DETECTED NOT DETECTED   Streptococcus pyogenes NOT DETECTED NOT DETECTED   Acinetobacter baumannii NOT DETECTED NOT DETECTED   Enterobacteriaceae species DETECTED (A) NOT DETECTED   Enterobacter cloacae complex NOT DETECTED NOT DETECTED   Escherichia coli NOT DETECTED NOT DETECTED   Klebsiella oxytoca NOT DETECTED NOT DETECTED   Klebsiella pneumoniae NOT DETECTED NOT DETECTED   Proteus species DETECTED (A) NOT DETECTED   Serratia marcescens NOT DETECTED NOT DETECTED   Carbapenem resistance NOT DETECTED NOT DETECTED   Haemophilus influenzae NOT DETECTED NOT DETECTED   Neisseria meningitidis NOT DETECTED NOT DETECTED   Pseudomonas aeruginosa NOT DETECTED NOT DETECTED   Candida albicans NOT DETECTED NOT DETECTED   Candida glabrata NOT DETECTED NOT DETECTED   Candida krusei NOT DETECTED NOT DETECTED   Candida parapsilosis NOT DETECTED NOT DETECTED   Candida tropicalis NOT DETECTED NOT DETECTED    Name of physician (or Provider) Contacted: [place]  Changes to prescribed antibiotics required: Patient with VRE in blood- Will switch vancomycin to daptomycin to cover appropriately   Sharin Mons, PharmD, BCPS PGY2 Infectious Diseases Pharmacy Resident Pager: [phone]  05/05/2017  7:57 PM

## 2017-05-06 DIAGNOSIS — L89300 Pressure ulcer of unspecified buttock, unstageable: Secondary | ICD-10-CM

## 2017-05-06 DIAGNOSIS — Z1621 Resistance to vancomycin: Secondary | ICD-10-CM

## 2017-05-06 DIAGNOSIS — E1065 Type 1 diabetes mellitus with hyperglycemia: Secondary | ICD-10-CM

## 2017-05-06 DIAGNOSIS — B961 Klebsiella pneumoniae [K. pneumoniae] as the cause of diseases classified elsewhere: Secondary | ICD-10-CM

## 2017-05-06 DIAGNOSIS — L89150 Pressure ulcer of sacral region, unstageable: Secondary | ICD-10-CM

## 2017-05-06 DIAGNOSIS — A498 Other bacterial infections of unspecified site: Secondary | ICD-10-CM | POA: Diagnosis present

## 2017-05-06 DIAGNOSIS — Z87440 Personal history of urinary (tract) infections: Secondary | ICD-10-CM

## 2017-05-06 DIAGNOSIS — Z960 Presence of urogenital implants: Secondary | ICD-10-CM

## 2017-05-06 DIAGNOSIS — Z978 Presence of other specified devices: Secondary | ICD-10-CM

## 2017-05-06 DIAGNOSIS — R7881 Bacteremia: Secondary | ICD-10-CM | POA: Diagnosis present

## 2017-05-06 LAB — BLOOD CULTURE ID PANEL (REFLEXED)
Acinetobacter baumannii: NOT DETECTED
CANDIDA GLABRATA: NOT DETECTED
CANDIDA KRUSEI: NOT DETECTED
CANDIDA TROPICALIS: NOT DETECTED
Candida albicans: NOT DETECTED
Candida parapsilosis: NOT DETECTED
Carbapenem resistance: NOT DETECTED
ESCHERICHIA COLI: NOT DETECTED
Enterobacter cloacae complex: NOT DETECTED
Enterobacteriaceae species: DETECTED — AB
Enterococcus species: DETECTED — AB
Haemophilus influenzae: NOT DETECTED
KLEBSIELLA OXYTOCA: NOT DETECTED
Klebsiella pneumoniae: NOT DETECTED
LISTERIA MONOCYTOGENES: NOT DETECTED
NEISSERIA MENINGITIDIS: NOT DETECTED
PROTEUS SPECIES: DETECTED — AB
Pseudomonas aeruginosa: NOT DETECTED
SERRATIA MARCESCENS: NOT DETECTED
Staphylococcus aureus (BCID): NOT DETECTED
Staphylococcus species: NOT DETECTED
Streptococcus agalactiae: NOT DETECTED
Streptococcus pneumoniae: NOT DETECTED
Streptococcus pyogenes: NOT DETECTED
Streptococcus species: NOT DETECTED
Vancomycin resistance: DETECTED — AB

## 2017-05-06 LAB — BASIC METABOLIC PANEL
Anion gap: 11 (ref 5–15)
BUN: 14 mg/dL (ref 6–20)
CHLORIDE: 102 mmol/L (ref 101–111)
CO2: 20 mmol/L — AB (ref 22–32)
CREATININE: 1.57 mg/dL — AB (ref 0.44–1.00)
Calcium: 8.1 mg/dL — ABNORMAL LOW (ref 8.9–10.3)
GFR calc non Af Amer: 37 mL/min — ABNORMAL LOW (ref >60)
GFR, EST AFRICAN AMERICAN: 43 mL/min — AB (ref >60)
Glucose, Bld: 313 mg/dL — ABNORMAL HIGH (ref 65–99)
Potassium: 3.6 mmol/L (ref 3.5–5.1)
Sodium: 133 mmol/L — ABNORMAL LOW (ref 135–145)

## 2017-05-06 LAB — MRSA CULTURE: Culture: NOT DETECTED

## 2017-05-06 LAB — GLUCOSE, CAPILLARY
GLUCOSE-CAPILLARY: 199 mg/dL — AB (ref 65–99)
Glucose-Capillary: 284 mg/dL — ABNORMAL HIGH (ref 65–99)
Glucose-Capillary: 267 mg/dL — ABNORMAL HIGH (ref 65–99)
Glucose-Capillary: 268 mg/dL — ABNORMAL HIGH (ref 65–99)

## 2017-05-06 LAB — URINE CULTURE

## 2017-05-06 LAB — MRSA PCR SCREENING: MRSA by PCR: NEGATIVE

## 2017-05-06 LAB — CBC
HCT: 26.2 % — ABNORMAL LOW (ref 36.0–46.0)
Hemoglobin: 8.6 g/dL — ABNORMAL LOW (ref 12.0–15.0)
MCH: 29.4 pg (ref 26.0–34.0)
MCHC: 32.8 g/dL (ref 30.0–36.0)
MCV: 89.4 fL (ref 78.0–100.0)
PLATELETS: 206 10*3/uL (ref 150–400)
RBC: 2.93 MIL/uL — AB (ref 3.87–5.11)
RDW: 15.2 % (ref 11.5–15.5)
WBC: 12.1 10*3/uL — ABNORMAL HIGH (ref 4.0–10.5)

## 2017-05-06 MED ORDER — INSULIN ASPART 100 UNIT/ML ~~LOC~~ SOLN
4.0000 [IU] | Freq: Three times a day (TID) | SUBCUTANEOUS | Status: DC
  Administered 2017-05-06 (×2): 4 [IU] via SUBCUTANEOUS

## 2017-05-06 NOTE — Evaluation (Signed)
Occupational Therapy Evaluation Patient Details Name: Debra [name] MRN: [id number] DOB: 12-10-1965 Today's Date: 05/06/2017    History of Present Illness Ms. Poullard is a 52yo female with PMH of diabetes, MS resulting in neurogenic bladder (with chronic indwelling foley), HTN, CKD III, recurrent UTIs and depression who presents from her nursing facility with elevated blood sugar. She was recently admitted from 2/14-2/26 for sepsis secondary to Proteus bacteremia, VRE UTI, requiring intubation 2/15-2/19 and pressors, as well as C. Diff infection.   Clinical Impression   Pt reports she has recently been max assist for ADL at bed level; including assist with self feeding at times. Currently pt requires mod assist overall for bed mobility, mod assist for UB ADL in sitting, and max assist for LB ADL. Pt presenting with generalized weakness, pain, poor balance, and decreased activity tolerance impacting her independence and safety with ADL and functional mobility. Recommend return to SNF upon d/c. Pt would benefit from continued skilled OT to address established goals.    Follow Up Recommendations  SNF    Equipment Recommendations  None recommended by OT    Recommendations for Other Services       Precautions / Restrictions Precautions Precautions: Fall Precaution Comments: patient reports has issues with orthostatic hypotension Restrictions Weight Bearing Restrictions: No      Mobility Bed Mobility Overal bed mobility: Needs Assistance Bed Mobility: Rolling;Sidelying to Sit;Sit to Sidelying Rolling: Min assist Sidelying to sit: Mod assist     Sit to sidelying: Mod assist General bed mobility comments: pt with good use of UEs on rail to roll and push into sitting, assist for LEs, to raise trunk and shift hips to EOB  Transfers Overall transfer level: Needs assistance   Transfers: Lateral/Scoot Transfers          Lateral/Scoot Transfers: Max assist;+2 physical  assistance;+2 safety/equipment General transfer comment: Worked on weight shifting and reciprocal scooting of hips for functional transfers; Noting better initiation of weight shifting for scooting, and disassociation of upper and lower trunk with scooting; still needing max assist and use of bed pad with close guard to decr shearing for scoots; painful    Balance Overall balance assessment: Needs assistance Sitting-balance support: Feet supported;Bilateral upper extremity supported;Single extremity supported Sitting balance-Leahy Scale: Fair Sitting balance - Comments: min guard assist overall for sitting balance at EOB x10 minutes                                   ADL either performed or assessed with clinical judgement   ADL Overall ADL's : Needs assistance/impaired Eating/Feeding: Set up;Bed level   Grooming: Minimal assistance;Sitting   Upper Body Bathing: Moderate assistance;Sitting   Lower Body Bathing: Sitting/lateral leans;Total assistance   Upper Body Dressing : Moderate assistance;Sitting   Lower Body Dressing: Total assistance;Sitting/lateral leans                 General ADL Comments: Pt tolerated sitting EOB x10 mintues this session with min guard assist. Worked on sitting balance, trunk control, and scooting activities at EOB.     Vision         Perception     Praxis      Pertinent Vitals/Pain Pain Assessment: Faces Faces Pain Scale: Hurts whole lot Pain Location: Sacrum  Pain Descriptors / Indicators: Aching;Grimacing Pain Intervention(s): Monitored during session;Limited activity within patient's tolerance;Repositioned     Hand Dominance Right   Extremity/Trunk Assessment  Upper Extremity Assessment Upper Extremity Assessment: Generalized weakness(grossly 4/5, hx peripheral neuropathy LE>UE)   Lower Extremity Assessment Lower Extremity Assessment: Defer to PT evaluation RLE Deficits / Details: AAROM limited hip extension with  some hypertonicity, strength hip flexion 2+/5, knee extension 2/5, ankle DF 0/5 RLE Sensation: decreased light touch;decreased proprioception RLE Coordination: decreased gross motor LLE Deficits / Details: AAROM limited hip extension with some hypertonicity, strength hip flexion 2+/5, knee extension 2/5, ankle DF 0/5 LLE Sensation: decreased light touch;decreased proprioception       Communication Communication Communication: No difficulties   Cognition Arousal/Alertness: Awake/alert Behavior During Therapy: WFL for tasks assessed/performed;Flat affect Overall Cognitive Status: Within Functional Limits for tasks assessed                                     General Comments  Reported some dizziness sitting up, BP 100/50 in sitting; will plan for orthostatics next session    Exercises     Shoulder Instructions      Home Living Family/patient expects to be discharged to:: Skilled nursing facility                                 Additional Comments: [facility]      Prior Functioning/Environment Level of Independence: Needs assistance  Gait / Transfers Assistance Needed: As of 2/21; reports has not been walking since September, states not even OOB or EOB at nursing facility due to pain in back and hips and orthostatic hypotension ADL's / Homemaking Assistance Needed: Most recently has been receiving significant assist from SNF staff for ADL at bed level. Pt reports she occasionally requires assist with self feeding as well            OT Problem List: Decreased strength;Decreased activity tolerance;Impaired balance (sitting and/or standing);Decreased knowledge of use of DME or AE;Impaired UE functional use;Pain      OT Treatment/Interventions: Self-care/ADL training;Therapeutic exercise;DME and/or AE instruction;Therapeutic activities;Patient/family education;Balance training    OT Goals(Current goals can be found in the care plan section)  Acute Rehab OT Goals Patient Stated Goal: decrease pain OT Goal Formulation: With patient Time For Goal Achievement: 05/20/17 Potential to Achieve Goals: Fair ADL Goals Pt Will Perform Grooming: with min guard assist;sitting Pt Will Transfer to Toilet: with max assist;with transfer board;bedside commode Pt/caregiver will Perform Home Exercise Program: Increased strength;Both right and left upper extremity;With theraband;With written HEP provided;Independently Additional ADL Goal #1: Pt will perform bed mobility with min guard assist as precursor to ADL.  OT Frequency: Min 2X/week   Barriers to D/C:            Co-evaluation PT/OT/SLP Co-Evaluation/Treatment: Yes Reason for Co-Treatment: For patient/therapist safety;To address functional/ADL transfers PT goals addressed during session: Mobility/safety with mobility;Balance OT goals addressed during session: Strengthening/ROM      AM-PAC PT "6 Clicks" Daily Activity     Outcome Measure Help from another person eating meals?: A Little Help from another person taking care of personal grooming?: A Little Help from another person toileting, which includes using toliet, bedpan, or urinal?: Total Help from another person bathing (including washing, rinsing, drying)?: A Lot Help from another person to put on and taking off regular upper body clothing?: A Lot Help from another person to put on and taking off regular lower body clothing?: Total 6 Click Score: 12  End of Session    Activity Tolerance: Patient limited by pain Patient left: in bed;with call bell/phone within reach  OT Visit Diagnosis: Other abnormalities of gait and mobility (R26.89);Muscle weakness (generalized) (M62.81);Pain Pain - part of body: (sacrum)                Time: 7116-5790 OT Time Calculation (min): 28 min Charges:  OT General Charges $OT Visit: 1 Visit OT Evaluation $OT Eval Moderate Complexity: 1 Mod G-Codes:     Britni Driscoll A. Brett Albino, M.S.,  OTR/L Pager: [phone]  Gaye Alken 05/06/2017, 7:14 PM

## 2017-05-06 NOTE — Progress Notes (Signed)
Subjective:  No acute events overnight. Patient states her bottom is sore. She has no other complaints this morning. States she has not been seen by [facility] RN. Discussed with patient positive blood cultures and need for IV antibiotic therapy. She verbalized understanding. All questions answered.   Objective:  Vital signs in last 24 hours: Vitals:   05/05/17 1419 05/05/17 1710 05/05/17 2005 05/06/17 0449  BP: 128/70 110/66 (!) 106/51 (!) 128/41  Pulse: 88 78 74 74  Resp: 18 16 15 16   Temp: 98.6 F (37 C) 98 F (36.7 C) 99.5 F (37.5 C) 99.1 F (37.3 C)  TempSrc: Oral Oral    SpO2: 98% 100% 99% 98%  Weight:   136 lb 11 oz (62 kg)   Height:       Physical Exam  Constitutional: She is oriented to person, place, and time. She appears well-developed and well-nourished. No distress.  Cardiovascular: Normal rate, regular rhythm and normal heart sounds. Exam reveals no gallop and no friction rub.  No murmur heard. Pulmonary/Chest: Effort normal and breath sounds normal. No respiratory distress. She has no wheezes.  Neurological: She is alert and oriented to person, place, and time.  Skin: She is not diaphoretic.  Unstageable pressure ulcer at sacrum and buttocks. Dressing c/d/i   Assessment/Plan:  Principal Problem:   Hyperglycemia Active Problems:   Type 1 diabetes mellitus (HCC)   Pressure injury of skin   Bacteriuria  # VRE and Proteus Bacteremia: Patient has an indwelling foley catheter due to neurogenic bladder secondary to MS and a history of recurrent UTIs. Patient was recently admitted on 2/14 in septic shock and found to have Proteus mirabilis bacteremia. She was also found to have Proteus mirabilis and VRE UTI as well as C. Diff (treated with PO vancomycin).  During that admission she was initially started on cefepime and aztreonam x 1 day.  This was switched to Rocephin and Linezolid after blood and urine cx results. She completed 7 days of antibiotics on 2/23 and repeat Bcx  on 2/21 were negative. Presumed source of bacteremia is urine. However, Ucx not performed on urine sample yesterday and patient has already been started on antibiotics. Will repeat Ucx, but may be negative. SNF documentation obtained shows UCx positive for Proteus mirabilis, VRE, and ESBL Klebsiella  on 3/7. She is currently afebrile and hemodynamically stable. Will continue antibiotic therapy as below.  - Continue IV Rocephin (Day 1) - Continue  IV Daptomycin (Day 1) - Will hold on adding a carbapenem to cover for ESBL Klebsiella as this urine sample was collected while previous foley in place. Will treat if it grows in Ucx from urine sample collected today - ID consult on 3/11 - Consider TTE    # Bacteriuria: Patient with bacteriuria w/o other s/sx of UTI on admission. Admission urine sample was collected from already indwelling foley and analysis may represent colonization of foley itself. Repeat UA post foley exchange with pyuria, LE, and rare bacteria. Unfortunately, unable to obtain Ucx from this urine sample as it was not sent on appropriate urine cup. Will obtain new urine specimen, however patient has been on antibiotics for over 12 hours.  - Follow up Ucx   # Hyperglycemia # T1DM with evidence of insulin resistance: Outpt regimen adjusted during precious admission due to reported hypoglycemia; at last discharge was on 30 units of Lantus, 8 units of novolog with meals +SSI. She is currently on Lantus 23 and SSI and continues to be hyperglycemic especially  after meals. Will add mealtime insulin as below.  - Continue Lantus 23U QHS - Start Novolog 4U TID with meals  - Continue SSI and CBG monitoring   # Unstageable pressure ulcer: Patient with sacral pressure injury which she reports has been present for 1 week, though suspicion that has been present for much longer given stage and dressing placed at SNF. Will continue to monitor for signs of infection. WOC consulted.  - Follow up wound  care consult recs  # Multiple sclerosis: Patient with h/o aggressive MS w/o signs of current flare. She was supposed to be on Rituxin therapy for maintenance, however this does not appear to have been done. Will try to reach out to SNF to evaluate reason as we had plans to have patient transferred to short stay for her infusions every 2 weeks and have her f/u with neurology outpatient.  - Continue muscle relaxants - Foley for neurogenic bladder - PT/OT consults for continued rehabilitation   Dispo: Anticipated discharge in approximately 2-5 day(s).   [name], MD 05/06/2017, 9:49 AM Pager: [phone]

## 2017-05-06 NOTE — Progress Notes (Signed)
Attempted to complete admission history, however patient was not interested in answering questions. Patient wanted to be left alone.    Trany Chernick, RN

## 2017-05-06 NOTE — Evaluation (Signed)
Physical Therapy Evaluation Patient Details Name: Debra [name] MRN: [id number] DOB: March 23, 1965 Today's Date: 05/06/2017   History of Present Illness  [name] is a 52yo female with PMH of diabetes, MS resulting in neurogenic bladder (with chronic indwelling foley), HTN, CKD III, recurrent UTIs and depression who presents from her nursing facility with elevated blood sugar. She was recently admitted from 2/14-2/26 for sepsis secondary to Proteus bacteremia, VRE UTI, requiring intubation 2/15-2/19 and pressors, as well as C. Diff infection.  Clinical Impression   Pt admitted with above diagnosis. Pt currently with functional limitations due to the deficits listed below (see PT Problem List). Debra [name] presents with significant weakness and decr activity tolerance, effecting her functional mobility; reports she has not been OOB to chair or wheelchair much at SNF; Painful at sacrum with moving, will need to be very careful of shearing with lateral scoot transfers; Geomat ordered; Discussed with Dr. Evelene Croon getting the Vital-Go bed for Debra [name] -- will speak with [name] and follow up with exactly how to get that for Debra [name];  Pt will benefit from skilled PT to increase their independence and safety with mobility to allow discharge to the venue listed below.       Follow Up Recommendations SNF(Recommend restarting therapies at SNF)    Equipment Recommendations  Other (comment)(Vital-Go bed while in-hospital)    Recommendations for Other Services       Precautions / Restrictions Precautions Precautions: Fall Precaution Comments: patient reports has issues with orthostatic hypotension      Mobility  Bed Mobility Overal bed mobility: Needs Assistance Bed Mobility: Rolling;Sidelying to Sit;Sit to Sidelying Rolling: Min assist Sidelying to sit: Mod assist     Sit to sidelying: Mod assist General bed mobility comments: pt with good use of UEs on rail to roll and push into sitting, assist for  LEs, to raise trunk and shift hips to EOB  Transfers Overall transfer level: Needs assistance   Transfers: Lateral/Scoot Transfers          Lateral/Scoot Transfers: Max assist;+2 physical assistance;+2 safety/equipment General transfer comment: Worked on weight shifting and reciprocal scooting of hips for functional transfers; Noting better initiation of weight shifting for scooting, and disassociation of upper and lower trunk with scooting; still needing max assist and use of bed pad with close guard to decr shearing for scoots; painful  Ambulation/Gait                Stairs            Wheelchair Mobility    Modified Rankin (Stroke Patients Only)       Balance     Sitting balance-Leahy Scale: Fair                                       Pertinent Vitals/Pain Pain Assessment: Faces Faces Pain Scale: Hurts whole lot Pain Location: Sacrum  Pain Descriptors / Indicators: Aching;Grimacing Pain Intervention(s): Monitored during session;Limited activity within patient's tolerance    Home Living Family/patient expects to be discharged to:: Skilled nursing facility                      Prior Function Level of Independence: Needs assistance   Gait / Transfers Assistance Needed: As of 2/21; reports has not been walking since September, states not even OOB or EOB at nursing facility due to pain in back and hips and  orthostatic hypotension           Hand Dominance   Dominant Hand: Right    Extremity/Trunk Assessment   Upper Extremity Assessment Upper Extremity Assessment: Defer to OT evaluation    Lower Extremity Assessment Lower Extremity Assessment: RLE deficits/detail;LLE deficits/detail RLE Deficits / Details: AAROM limited hip extension with some hypertonicity, strength hip flexion 2+/5, knee extension 2/5, ankle DF 0/5 RLE Sensation: decreased light touch;decreased proprioception RLE Coordination: decreased gross motor LLE  Deficits / Details: AAROM limited hip extension with some hypertonicity, strength hip flexion 2+/5, knee extension 2/5, ankle DF 0/5 LLE Sensation: decreased light touch;decreased proprioception       Communication   Communication: No difficulties  Cognition Arousal/Alertness: Awake/alert Behavior During Therapy: WFL for tasks assessed/performed;Flat affect Overall Cognitive Status: Within Functional Limits for tasks assessed                                        General Comments General comments (skin integrity, edema, etc.): Reported some dizziness sitting up, BP 100/50 in sitting; will plan for orthostatics next session    Exercises     Assessment/Plan    PT Assessment Patient needs continued PT services  PT Problem List Decreased range of motion;Decreased strength;Decreased mobility;Impaired tone;Decreased activity tolerance;Decreased balance       PT Treatment Interventions Functional mobility training;Balance training;Patient/family education;Therapeutic activities;Therapeutic exercise    PT Goals (Current goals can be found in the Care Plan section)  Acute Rehab PT Goals Patient Stated Goal: did not state PT Goal Formulation: With patient Time For Goal Achievement: 05/20/17 Potential to Achieve Goals: Fair    Frequency Min 3X/week   Barriers to discharge   Is there any way we can get more therapy services for her at SNF?    Co-evaluation PT/OT/SLP Co-Evaluation/Treatment: Yes Reason for Co-Treatment: For patient/therapist safety;To address functional/ADL transfers PT goals addressed during session: Mobility/safety with mobility;Balance         AM-PAC PT "6 Clicks" Daily Activity  Outcome Measure Difficulty turning over in bed (including adjusting bedclothes, sheets and blankets)?: A Lot Difficulty moving from lying on back to sitting on the side of the bed? : Unable Difficulty sitting down on and standing up from a chair with arms (e.g.,  wheelchair, bedside commode, etc,.)?: Unable Help needed moving to and from a bed to chair (including a wheelchair)?: Total Help needed walking in hospital room?: Total Help needed climbing 3-5 steps with a railing? : Total 6 Click Score: 7    End of Session Equipment Utilized During Treatment: Other (comment)(bed pad) Activity Tolerance: Patient limited by fatigue Patient left: in bed;with call bell/phone within reach;Other (comment)(R sidelying) Nurse Communication: Mobility status PT Visit Diagnosis: Muscle weakness (generalized) (M62.81)    Time: 9678-9381 PT Time Calculation (min) (ACUTE ONLY): 32 min   Charges:   PT Evaluation $PT Eval Moderate Complexity: 1 Mod     PT G Codes:        Van Clines, PT  Acute Rehabilitation Services Pager [phone] Office [phone]   Levi Aland 05/06/2017, 5:24 PM

## 2017-05-07 ENCOUNTER — Other Ambulatory Visit: Payer: Self-pay

## 2017-05-07 ENCOUNTER — Encounter (HOSPITAL_COMMUNITY): Payer: Self-pay | Admitting: General Practice

## 2017-05-07 DIAGNOSIS — R7889 Finding of other specified substances, not normally found in blood: Secondary | ICD-10-CM

## 2017-05-07 DIAGNOSIS — B964 Proteus (mirabilis) (morganii) as the cause of diseases classified elsewhere: Secondary | ICD-10-CM

## 2017-05-07 DIAGNOSIS — L89159 Pressure ulcer of sacral region, unspecified stage: Secondary | ICD-10-CM

## 2017-05-07 DIAGNOSIS — Z960 Presence of urogenital implants: Secondary | ICD-10-CM

## 2017-05-07 DIAGNOSIS — Z880 Allergy status to penicillin: Secondary | ICD-10-CM

## 2017-05-07 DIAGNOSIS — N319 Neuromuscular dysfunction of bladder, unspecified: Secondary | ICD-10-CM

## 2017-05-07 DIAGNOSIS — G35 Multiple sclerosis: Secondary | ICD-10-CM

## 2017-05-07 DIAGNOSIS — Z91048 Other nonmedicinal substance allergy status: Secondary | ICD-10-CM

## 2017-05-07 DIAGNOSIS — B952 Enterococcus as the cause of diseases classified elsewhere: Secondary | ICD-10-CM

## 2017-05-07 DIAGNOSIS — R197 Diarrhea, unspecified: Secondary | ICD-10-CM

## 2017-05-07 LAB — BASIC METABOLIC PANEL
Anion gap: 9 (ref 5–15)
BUN: 10 mg/dL (ref 6–20)
CALCIUM: 8.4 mg/dL — AB (ref 8.9–10.3)
CO2: 23 mmol/L (ref 22–32)
Chloride: 104 mmol/L (ref 101–111)
Creatinine, Ser: 1.3 mg/dL — ABNORMAL HIGH (ref 0.44–1.00)
GFR, EST AFRICAN AMERICAN: 54 mL/min — AB (ref >60)
GFR, EST NON AFRICAN AMERICAN: 47 mL/min — AB (ref >60)
Glucose, Bld: 202 mg/dL — ABNORMAL HIGH (ref 65–99)
Potassium: 3.7 mmol/L (ref 3.5–5.1)
Sodium: 136 mmol/L (ref 135–145)

## 2017-05-07 LAB — URINE CULTURE

## 2017-05-07 LAB — CBC
HCT: 26.1 % — ABNORMAL LOW (ref 36.0–46.0)
Hemoglobin: 8.3 g/dL — ABNORMAL LOW (ref 12.0–15.0)
MCH: 28 pg (ref 26.0–34.0)
MCHC: 31.8 g/dL (ref 30.0–36.0)
MCV: 88.2 fL (ref 78.0–100.0)
PLATELETS: 215 10*3/uL (ref 150–400)
RBC: 2.96 MIL/uL — AB (ref 3.87–5.11)
RDW: 15 % (ref 11.5–15.5)
WBC: 13.9 10*3/uL — ABNORMAL HIGH (ref 4.0–10.5)

## 2017-05-07 LAB — GLUCOSE, CAPILLARY
GLUCOSE-CAPILLARY: 281 mg/dL — AB (ref 65–99)
GLUCOSE-CAPILLARY: 98 mg/dL (ref 65–99)
Glucose-Capillary: 261 mg/dL — ABNORMAL HIGH (ref 65–99)
Glucose-Capillary: 215 mg/dL — ABNORMAL HIGH (ref 65–99)

## 2017-05-07 MED ORDER — COLLAGENASE 250 UNIT/GM EX OINT
TOPICAL_OINTMENT | Freq: Every day | CUTANEOUS | Status: DC
  Administered 2017-05-09 – 2017-05-18 (×8): via TOPICAL
  Filled 2017-05-07 (×2): qty 30

## 2017-05-07 MED ORDER — HYDROMORPHONE HCL 1 MG/ML IJ SOLN
0.5000 mg | Freq: Three times a day (TID) | INTRAMUSCULAR | Status: AC | PRN
  Administered 2017-05-07 – 2017-05-08 (×3): 0.5 mg via INTRAVENOUS
  Filled 2017-05-07 (×3): qty 0.5

## 2017-05-07 MED ORDER — INSULIN ASPART 100 UNIT/ML ~~LOC~~ SOLN
6.0000 [IU] | Freq: Three times a day (TID) | SUBCUTANEOUS | Status: DC
  Administered 2017-05-07 – 2017-05-08 (×4): 6 [IU] via SUBCUTANEOUS

## 2017-05-07 MED ORDER — COLLAGENASE 250 UNIT/GM EX OINT: TOPICAL_OINTMENT | Freq: Every day | CUTANEOUS | Status: DC

## 2017-05-07 MED ORDER — HYDROMORPHONE HCL 1 MG/ML IJ SOLN
1.0000 mg | Freq: Once | INTRAMUSCULAR | Status: AC
Start: 2017-05-07 — End: 2017-05-07
  Administered 2017-05-07: 1 mg via INTRAVENOUS
  Filled 2017-05-07: qty 1

## 2017-05-07 NOTE — Consult Note (Signed)
WOC Nurse wound consult note Reason for Consult: Unstageable pressure injury to right buttock/sacral/coccyx area Wound type: Pressure Injury, unstageable Pressure Injury POA: Yes Measurement: 10.4 cm x 5 cm x unknown depth Wound bed:  100% yellow slough Drainage (amount, consistency, odor) Malodorous, brown drainage on foam dressing that is in place. Periwound: Intact Dressing procedure/placement/frequency: Will order hydrotherapy by PT Monday - Saturday with Santyl dressings following hydrotherapy, and Santyl by nursing on Sundays.  An air replacement mattress is in the hall ready for placement for patient use. Thank you for the consult.  Discussed plan of care with the patient and bedside nurse.  WOC nurse will not follow at this time.  Please re-consult the WOC team if needed.  Helmut Muster, RN, MSN, CWOCN, CNS-BC, pager [phone]

## 2017-05-07 NOTE — Consult Note (Signed)
[facility]       Reason for Consult: Enterococcal positive blood culture    Referring Physician: CHAMP autoconsult  Principal Problem:   Hyperglycemia Active Problems:   Type 1 diabetes mellitus (HCC)   Multiple sclerosis (HCC)   Neurogenic bladder   Pressure injury of skin   Bacteriuria   Bacteremia due to vancomycin resistant Enterococcus   Proteus infection   Foley catheter in place on admission   . amLODipine  10 mg Oral Daily  . baclofen  5 mg Oral TID  . bethanechol  50 mg Oral TID  . collagenase   Topical Daily  . enoxaparin (LOVENOX) injection  40 mg Subcutaneous Q24H  . insulin aspart  0-9 Units Subcutaneous TID WC  . insulin aspart  6 Units Subcutaneous TID WC  . insulin glargine  23 Units Subcutaneous QHS  . lactose free nutrition  237 mL Oral TID WC  . lisinopril  20 mg Oral Daily  . metoprolol succinate  100 mg Oral Daily  . risperiDONE  1 mg Oral QHS  . tamsulosin  0.4 mg Oral Daily  . Vitamin D (Ergocalciferol)  50,000 Units Oral Q7 days    Recommendations: 1 more dose of daptomycin 1 more dose of ceftriaxone Then stop antibiotics   Assessment: She came in with hyperglycemia and has had no urinary complaints, though did develop a fever on 3/9 and blood cultures with Proteus and Enterococcus, 1/2.   She previously had the same in a urine culture during hosptialization in February.  I most suspect her foley has been colonized, now changed yesterday so prolonged treatment not indicated since she has been asymptomatic.    I will not follow, please call with any new issues or concerns.   Thanks  Antibiotics: daptomycin day 3 Ceftriaxone day 3  HPI: Debra [name] is a 52 y.o. female with MS and chronic indwelling foley due to neurogenic bladder and recent hospitalization from urinary sepsis with VRE and Proteus.  She also developed C diff at that time and treated with oral vancomycin.  She came in now as above, with  hyperglycemia.  She has had persistent pain due to her sacral ulcer but was having no fever, no urinary symptoms at home.  No associated rash, diarrhea.   Review of Systems:  Constitutional: negative for fevers and chills Gastrointestinal: negative for diarrhea Integument/breast: negative for rash All other systems reviewed and are negative    Past Medical History:  Diagnosis Date  . Adenomatous colonic polyps   . Anemia    2005  . Anxiety    1990  . Arthritis   . Asthma    2000  . Benign essential HTN   . Cataract   . Depression with psychotic symptoms   . Diabetic ketoacidosis without coma associated with type 1 diabetes mellitus (HCC)   . Difficult intubation    narrow airway  . Fibromyalgia 11/07/2016  . Gastroesophageal Reflux Disease (GERD)   . Heart murmur    Birth  . Hyperlipidemia    2005  . Hypertension    1998  . Internal hemorrhoids 04/24/06   on colonoscopy  . Migraine   . Multiple sclerosis (HCC)   . Neuropathic bladder   . Neuropathy of the hands & feet   . Restless Leg Syndrome   . Right Ankle Fracture 10/06/2013  . Sepsis due to urinary tract infection (HCC) 04/12/2017  . Sleep paralysis   . Stable angina pectoris  2007: cath showing normal cors.   . Stroke 1990  . Type I Diabetes Mellitus 1988    Social History   Tobacco Use  . Smoking status: Never Smoker  . Smokeless tobacco: Never Used  Substance Use Topics  . Alcohol use: No    Alcohol/week: 0.0 oz  . Drug use: No    Comment: drug addict    Family History  Problem Relation Age of Onset  . Hypertension Mother   . Kidney disease Mother   . Hypertension Father   . Breast cancer Maternal Grandmother   . Prostate cancer Maternal Grandfather   . Ovarian cancer Paternal Grandmother   . Prostate cancer Paternal Grandfather   . Colon cancer Maternal Uncle        Family history of malignant neoplasm of gastrointestinal tract    Allergies  Allergen Reactions  . Penicillins  Anaphylaxis, Nausea And Vomiting and Rash    Has patient had a PCN reaction causing immediate rash, facial/tongue/throat swelling, SOB or lightheadedness with hypotension: Yes Has patient had a PCN reaction causing severe rash involving mucus membranes or skin necrosis: Yes Has patient had a PCN reaction that required hospitalization No Has patient had a PCN reaction occurring within the last 10 years: Yes If all of the above answers are "NO", then may proceed with Cephalosporin use.   . Pollen Extract Other (See Comments)    Seasonal Allergies  . Tape Rash    Physical Exam: Constitutional: in no apparent distress and alert  Vitals:   05/07/17 0900 05/07/17 1057  BP: 127/60 (!) 142/65  Pulse: 76   Resp: 16   Temp: 99.1 F (37.3 C)   SpO2: 99%    EYES: anicteric ENMT: no thush Cardiovascular: Cor RRR Respiratory: CTA B; normal respiratory effort GI: Bowel sounds are normal, liver is not enlarged, spleen is not enlarged Musculoskeletal: no pedal edema noted Skin: negatives: no rash Hematologic: no cervical lad  Lab Results  Component Value Date   WBC 13.9 (H) 05/07/2017   HGB 8.3 (L) 05/07/2017   HCT 26.1 (L) 05/07/2017   MCV 88.2 05/07/2017   PLT 215 05/07/2017    Lab Results  Component Value Date   CREATININE 1.30 (H) 05/07/2017   BUN 10 05/07/2017   NA 136 05/07/2017   K 3.7 05/07/2017   CL 104 05/07/2017   CO2 23 05/07/2017    Lab Results  Component Value Date   ALT 12 (L) 05/05/2017   AST 14 (L) 05/05/2017   ALKPHOS 92 05/05/2017     Microbiology: Recent Results (from the past 240 hour(s))  Culture, blood (Routine X 2) w Reflex to ID Panel     Status: Abnormal (Preliminary result)   Collection Time: 05/05/17  2:50 AM  Result Value Ref Range Status   Specimen Description BLOOD RIGHT HAND  Final   Special Requests IN PEDIATRIC BOTTLE Blood Culture adequate volume  Final   Culture  Setup Time   Final    GRAM POSITIVE COCCI GRAM NEGATIVE RODS IN  PEDIATRIC BOTTLE CRITICAL RESULT CALLED TO, READ BACK BY AND VERIFIED WITH: ESINCLAIR,PHARMD @1950  05/05/17 BY LHOWARD    Culture (A)  Final    PROTEUS MIRABILIS GRAM POSITIVE COCCI IDENTIFICATION AND SUSCEPTIBILITIES TO FOLLOW FOR ORGANISM 2 Performed at [facility], [address] [place], [place] [address]    Report Status PENDING  Incomplete   Organism ID, Bacteria PROTEUS MIRABILIS  Final      Susceptibility   Proteus  mirabilis - MIC*    AMPICILLIN >=32 RESISTANT Resistant     CEFAZOLIN <=4 SENSITIVE Sensitive     CEFEPIME <=1 SENSITIVE Sensitive     CEFTAZIDIME <=1 SENSITIVE Sensitive     CEFTRIAXONE <=1 SENSITIVE Sensitive     CIPROFLOXACIN <=0.25 SENSITIVE Sensitive     GENTAMICIN <=1 SENSITIVE Sensitive     IMIPENEM 0.5 SENSITIVE Sensitive     TRIMETH/SULFA <=20 SENSITIVE Sensitive     AMPICILLIN/SULBACTAM 4 SENSITIVE Sensitive     PIP/TAZO <=4 SENSITIVE Sensitive     * PROTEUS MIRABILIS  Blood Culture ID Panel (Reflexed)     Status: Abnormal   Collection Time: 05/05/17  2:50 AM  Result Value Ref Range Status   Enterococcus species DETECTED (A) NOT DETECTED Final    Comment: CRITICAL RESULT CALLED TO, READ BACK BY AND VERIFIED WITH: ESINCLAIR,PHARMD @1950  05/05/17 LHOWARD    Vancomycin resistance DETECTED (A) NOT DETECTED Final    Comment: CRITICAL RESULT CALLED TO, READ BACK BY AND VERIFIED WITH: ESINCLAIR,PHARMD @1950  05/05/17 BY LHOWARD    Listeria monocytogenes NOT DETECTED NOT DETECTED Final   Staphylococcus species NOT DETECTED NOT DETECTED Final   Staphylococcus aureus NOT DETECTED NOT DETECTED Final   Streptococcus species NOT DETECTED NOT DETECTED Final   Streptococcus agalactiae NOT DETECTED NOT DETECTED Final   Streptococcus pneumoniae NOT DETECTED NOT DETECTED Final   Streptococcus pyogenes NOT DETECTED NOT DETECTED Final   Acinetobacter baumannii NOT DETECTED NOT DETECTED Final   Enterobacteriaceae species DETECTED (A) NOT DETECTED Final     Comment: Enterobacteriaceae represent a large family of gram-negative bacteria, not a single organism. CRITICAL RESULT CALLED TO, READ BACK BY AND VERIFIED WITH: ESINCLAIR,PHARMD @1950  05/05/17 BY LHOWARD    Enterobacter cloacae complex NOT DETECTED NOT DETECTED Final   Escherichia coli NOT DETECTED NOT DETECTED Final   Klebsiella oxytoca NOT DETECTED NOT DETECTED Final   Klebsiella pneumoniae NOT DETECTED NOT DETECTED Final   Proteus species DETECTED (A) NOT DETECTED Final    Comment: CRITICAL RESULT CALLED TO, READ BACK BY AND VERIFIED WITH: ESINCLAIR,PHARMD @1950  05/05/17 BY LHOWARD    Serratia marcescens NOT DETECTED NOT DETECTED Final   Carbapenem resistance NOT DETECTED NOT DETECTED Final   Haemophilus influenzae NOT DETECTED NOT DETECTED Final   Neisseria meningitidis NOT DETECTED NOT DETECTED Final   Pseudomonas aeruginosa NOT DETECTED NOT DETECTED Final   Candida albicans NOT DETECTED NOT DETECTED Final   Candida glabrata NOT DETECTED NOT DETECTED Final   Candida krusei NOT DETECTED NOT DETECTED Final   Candida parapsilosis NOT DETECTED NOT DETECTED Final   Candida tropicalis NOT DETECTED NOT DETECTED Final    Comment: Performed at [facility], [address] [place], [place] [address]  Culture, blood (Routine X 2) w Reflex to ID Panel     Status: None (Preliminary result)   Collection Time: 05/05/17  4:31 AM  Result Value Ref Range Status   Specimen Description BLOOD RIGHT ARM  Final   Special Requests   Final    BOTTLES DRAWN AEROBIC AND ANAEROBIC Blood Culture adequate volume   Culture   Final    NO GROWTH 1 DAY Performed at [facility], [address], [place], [place] [address]    Report Status PENDING  Incomplete  Urine Culture     Status: Abnormal   Collection Time: 05/05/17  5:33 AM  Result Value Ref Range Status   Specimen Description URINE, CATHETERIZED  Final   Special Requests   Final  NONE Performed at [facility], [address], [place], [place] [address]    Culture MULTIPLE SPECIES PRESENT, SUGGEST RECOLLECTION (A)  Final   Report Status 05/06/2017 FINAL  Final  MRSA PCR Screening     Status: Abnormal   Collection Time: 05/05/17  6:55 AM  Result Value Ref Range Status   MRSA by PCR INVALID RESULTS, SPECIMEN SENT FOR CULTURE (A) NEGATIVE Final    Comment: RESULT CALLED TO, READ BACK BY AND VERIFIED WITH: A.RIO RN AT 1130 05/05/17 BY A.DAVIS        The GeneXpert MRSA Assay (FDA approved for NASAL specimens only), is one component of a comprehensive MRSA colonization surveillance program. It is not intended to diagnose MRSA infection nor to guide or monitor treatment for MRSA infections. Performed at [facility], [address], [place], [place] [address]   MRSA culture     Status: None   Collection Time: 05/05/17  6:55 AM  Result Value Ref Range Status   Specimen Description NASAL SWAB  Final   Special Requests NONE  Final   Culture   Final    NO MRSA DETECTED Performed at [facility], [address], [place], [place] [address]    Report Status 05/06/2017 FINAL  Final  MRSA PCR Screening     Status: None   Collection Time: 05/05/17 10:13 PM  Result Value Ref Range Status   MRSA by PCR NEGATIVE NEGATIVE Final    Comment:        The GeneXpert MRSA Assay (FDA approved for NASAL specimens only), is one component of a comprehensive MRSA colonization surveillance program. It is not intended to diagnose MRSA infection nor to guide or monitor treatment for MRSA infections. Performed at [facility], [address], [place], [place] [address]   Culture, Urine     Status: Abnormal   Collection Time: 05/06/17 12:09 PM  Result Value Ref Range Status   Specimen Description URINE, CLEAN CATCH  Final   Special Requests   Final    NONE Performed at [facility], [address] [place], [place] [address]    Culture MULTIPLE SPECIES PRESENT, SUGGEST RECOLLECTION (A)  Final    Report Status 05/07/2017 FINAL  Final    Gardiner Barefoot, MD [facility] [email or web address] [phone] pager  [phone] cell 05/07/2017, 11:42 AM

## 2017-05-07 NOTE — Progress Notes (Addendum)
Subjective:  Patient had one documented fever overnight (T 101) at 2200. Tylenol given. Otherwise no acute events overnight. This morning she complains of excruciating pain in sacral area, rates it a 8/10. She had received oxy IR 30 minutes prior to being seen. Per RN, she also had 3 loose stools this morning, which patient states is not normal for her. She denies abdominal pain. Discussed with patient ongoing antibiotic therapy for treatment of bacteremia.   Objective:  Vital signs in last 24 hours: Vitals:   05/07/17 0429 05/07/17 0900 05/07/17 1057 05/07/17 1637  BP: 139/67 127/60 (!) 142/65 (!) 144/62  Pulse: 73 76  83  Resp: 15 16  16   Temp: 99 F (37.2 C) 99.1 F (37.3 C)  (!) 102.1 F (38.9 C)  TempSrc:  Oral  Oral  SpO2: 100% 99%  97%  Weight:      Height:       Physical Exam  Constitutional: She is oriented to person, place, and time.  Patient appears in moderate distress secondary to pain on sacral area  Cardiovascular:  Murmur heard. Regular rate and rhythm, II/VI systolic murmur best hear at upper sternal borders  Pulmonary/Chest: Effort normal. No respiratory distress.  Abdominal: Soft. Bowel sounds are normal. She exhibits no distension. There is no tenderness.  Musculoskeletal: She exhibits no edema.  Neurological: She is alert and oriented to person, place, and time.  Skin:  Pressure ulcer on sacral area covered with dressing which was c/d/i    Assessment/Plan:  Principal Problem:   Bacteremia due to vancomycin resistant Enterococcus Active Problems:   Type 1 diabetes mellitus (HCC)   Multiple sclerosis (HCC)   Neurogenic bladder   Pressure injury of skin   Hyperglycemia   Bacteriuria   Proteus infection   Foley catheter in place on admission   Diarrhea  # VRE and Proteus Bacteremia: WBC continues to trend up, WBC 13.9. Currently on Day 3 of IV Daptomycin and Rocephin. Nocturnal fevers for the past 2 days, but afebrile during daytime and otherwise  hemodynamically stable. Ucx collected at [facility] on 3/7 positive for ESBL Klebsiella and concern for untreated infection as current antibiotic therapy would not cover this organism. Per ID recommendations, we will continue to monitor fevers and avoid adding a carbapenem to cover for ESBL Klebsiella given that she is at high risk for C. difficile infection. - ID consulted, appreciate recommendations  - Continue IV Daptomycin and Rocephin (Day 3) - Repeat blood cx  - Tylenol 650 mg PRN for fever  - Will hold on adding a carbapenem to cover for ESBL Klebsiella per ID recs  - Consider TTE   # Diarrhea: RN reports a total of 5 lose bowel movements this morning.  She does have loose stools at baseline but patient reports this is not normal for her.  Concern for infectious etiology given recent C. difficile infection in 03/2017 (completed treatment with p.o. vancomycin) versus side effect from antibiotic therapy.  Per ID recommendations, C diff testing in AM if patient continues to have diarrhea. - Continue to monitor  - C. Diff testing in AM if ongoing diarrhea    # Bacteriuria: Patient with bacteriuria w/o other s/sx of UTI on admission. Admission urine sample was collected from already indwelling foley and analysis may represent colonization of foley itself. Repeat UA post foley exchange with pyuria, LE, and rare bacteria. Unfortunately, unable to obtain Ucx from this urine sample as it was not sent on appropriate urine cup. Will  obtain new urine specimen, however patient has been on antibiotics for over 12 hours.  - Ucx from 3/10 with multiple species   # Hyperglycemia # T1DM with evidence of insulin resistance: Remains hyperglycemic with CBG 260s after meals. Will continue to adjust insulin regimen as below.  - Continue Lantus 23U QHS - Increase Novolog 4U-->6U TID with meals  - Continue SSI and CBG monitoring   # Unstageable pressure ulcer: WOund care recommended hydrotherapy M-Sat with Santyl  dressings after therapy and air replacement mattress  - Hydrotherapy M-Sat - IV Dilaudid 0.5 mg PRN ordered to give prior to hydrotherapy  - Santyl dressings - Air mattress  - WOC signed off    # Multiple sclerosis: Patient with h/o aggressive MS w/o signs of current flare. She has an indwelling catheter secondary to neurogenic bladder. She is supposed to be on Rituxin therapy for maintenance, however this does not appear to have been done.  - Continue muscle relaxants - PT/OT consults for continued rehabilitation   Dispo: Anticipated discharge in approximately 2-5 day(s) pending treatment for bacteremia.   [name], MD 05/07/2017, 5:43 PM Pager: [phone]

## 2017-05-07 NOTE — Progress Notes (Signed)
Patient running fever of 101.5.  MD made aware.  Tylenol 650 mg given at 2139.  Will continue to monitor patient.  Bernie Covey RN-BC, [employer]

## 2017-05-07 NOTE — Progress Notes (Signed)
Physical Therapy Wound  Evaluation and Treatment Patient Details  Name: Debra [name] MRN: [id number] Date of Birth: 03-29-1965  Today's Date: 05/07/2017 Time: 7579-7282 Time Calculation (min): 49 min  Subjective  Subjective: minimal pt response to questions  Pain Score: Pt premedicated prior to treatment. Facial Pain score 8/10 with pulsed lavage.   Wound Assessment  Pressure Injury 05/07/17 Unstageable - Full thickness tissue loss in which the base of the ulcer is covered by slough (yellow, tan, gray, green or brown) and/or eschar (tan, brown or black) in the wound bed. Sacral Wound to R of midline (Active)  Dressing Type ABD;Moist to dry 05/07/2017  5:00 PM  Dressing Changed;Clean;Dry;Intact 05/07/2017  5:00 PM  Dressing Change Frequency Daily 05/07/2017  5:00 PM  State of Healing Eschar 05/07/2017  5:00 PM  Site / Wound Assessment Yellow 05/07/2017  5:00 PM  % Wound base Yellow/Fibrinous Exudate 100% 05/07/2017  5:00 PM  Peri-wound Assessment Intact 05/07/2017  5:00 PM  Wound Length (cm) 10.5 cm 05/07/2017  5:00 PM  Wound Width (cm) 4.5 cm 05/07/2017  5:00 PM  Wound Surface Area (cm^2) 47.25 cm^2 05/07/2017  5:00 PM  Margins Unattached edges (unapproximated) 05/07/2017  5:00 PM  Drainage Amount Minimal 05/07/2017  5:00 PM  Drainage Description Serous;Odor 05/07/2017  5:00 PM  Treatment Cleansed;Debridement (Selective);Hydrotherapy (Pulse lavage);Packing (Saline gauze) 05/07/2017  5:00 PM  Santyl applied to wound bed.   Hydrotherapy Pulsed lavage therapy - wound location: sacrum Pulsed Lavage with Suction (psi): 4 psi(4-12 psi) Pulsed Lavage with Suction - Normal Saline Used: 1000 mL Pulsed Lavage Tip: Tip with splash shield Selective Debridement Selective Debridement - Location: Sacrum Selective Debridement - Tools Used: Forceps;Scalpel Selective Debridement - Tissue Removed: Tissue too adhered for removal, wound scored for increased enzamatic debridement   Wound Assessment and  Plan  Wound Therapy - Assess/Plan/Recommendations Wound Therapy - Clinical Statement: Pt sacral wound is completely covered by closely adhered yellow slough material, Pt will benefit from pulsed lavage and selective debridement to remove necrotic material and decrease bioburden for increased opportunity for wound healing. Factors Delaying/Impairing Wound Healing: Diabetes Mellitus;Multiple medical problems;Immobility Hydrotherapy Plan: Debridement;Dressing change;Pulsatile lavage with suction;Patient/family education Wound Therapy - Frequency: 6X / week Wound Therapy - Follow Up Recommendations: Skilled nursing facility  Wound Therapy Goals- Improve the function of patient's integumentary system by progressing the wound(s) through the phases of wound healing (inflammation - proliferation - remodeling) by: Decrease Necrotic Tissue to: 20 Increase Granulation Tissue to: 80 Goals/treatment plan/discharge plan were made with and agreed upon by patient/family: Yes Time For Goal Achievement: 7 days Wound Therapy - Potential for Goals: Fair  Goals will be updated until maximal potential achieved or discharge criteria met.  Discharge criteria: when goals achieved, discharge from hospital, MD decision/surgical intervention, no progress towards goals, refusal/missing three consecutive treatments without notification or medical reason.  GP    [name]. [name], DPT Acute Rehabilitation  [phone] Pager [phone]   [place] 05/07/2017, 5:38 PM

## 2017-05-07 NOTE — Progress Notes (Signed)
Physical Therapy Treatment Patient Details Name: Debra [name] MRN: [id number] DOB: [birth year] Today's Date: 05/07/2017    History of Present Illness Debra [name] is a 52yo female with PMH of diabetes, MS resulting in neurogenic bladder (with chronic indwelling foley), HTN, CKD III, recurrent UTIs and depression who presents from her nursing facility with elevated blood sugar. She was recently admitted from 2/14-2/26 for sepsis secondary to Proteus bacteremia, VRE UTI, requiring intubation 2/15-2/19 and pressors, as well as C. Diff infection.    PT Comments    Patient slowly progressing due to fatigue and pain.  Now with tilt bed so will initiate protocol next session.  PT to follow and RN education ongoing.   Follow Up Recommendations  SNF     Equipment Recommendations  None recommended by PT    Recommendations for Other Services       Precautions / Restrictions Precautions Precautions: Fall    Mobility  Bed Mobility Overal bed mobility: Needs Assistance Bed Mobility: Rolling Rolling: Mod assist         General bed mobility comments: rolling to place and unplace pad to scoot to vital go tilt bed  Transfers Overall transfer level: Needs assistance   Transfers: Lateral/Scoot Transfers          Lateral/Scoot Transfers: Total assist;+2 physical assistance General transfer comment: scooted from air matress bed to vital go tilt bed with nursing assist.  Positioned and educated nursing on bed and plan for therapy to tilt pt then nursing to continue to follow through on therapy recommendations.  Placed iinstructions in room and times for inservices on bed on the unit.  Ambulation/Gait                 Stairs            Wheelchair Mobility    Modified Rankin (Stroke Patients Only)       Balance                                            Cognition Arousal/Alertness: Awake/alert Behavior During Therapy: Flat affect Overall  Cognitive Status: No family/caregiver present to determine baseline cognitive functioning                                        Exercises General Exercises - Lower Extremity Heel Slides: 10 reps;AAROM;Supine Other Exercises Other Exercises: intiated LE stretching prior to bed arriving, then pt not willing to resume after transfer to new bed    General Comments        Pertinent Vitals/Pain Faces Pain Scale: Hurts whole lot Pain Location: legs Pain Descriptors / Indicators: Aching;Grimacing Pain Intervention(s): Limited activity within patient's tolerance;Repositioned    Home Living                      Prior Function            PT Goals (current goals can now be found in the care plan section) Progress towards PT goals: Progressing toward goals(slowly)    Frequency    Min 3X/week      PT Plan Current plan remains appropriate    Co-evaluation              AM-PAC PT "6 Clicks" Daily Activity  Outcome  Measure  Difficulty turning over in bed (including adjusting bedclothes, sheets and blankets)?: A Lot Difficulty moving from lying on back to sitting on the side of the bed? : Unable Difficulty sitting down on and standing up from a chair with arms (e.g., wheelchair, bedside commode, etc,.)?: Unable Help needed moving to and from a bed to chair (including a wheelchair)?: Total Help needed walking in hospital room?: Total Help needed climbing 3-5 steps with a railing? : Total 6 Click Score: 7    End of Session Equipment Utilized During Treatment: Oxygen Activity Tolerance: Patient tolerated treatment well Patient left: in chair;with bed alarm set;with call bell/phone within reach Nurse Communication: Other (comment)(education on bed) PT Visit Diagnosis: Muscle weakness (generalized) (M62.81);Adult, failure to thrive (R62.7)     Time: 8616-8372 PT Time Calculation (min) (ACUTE ONLY): 22 min  Charges:  $Therapeutic Activity: 8-22  mins                    G CodesSheran [name], [place] [phone] 05/07/2017    Debra [name] 05/07/2017, 5:40 PM

## 2017-05-08 DIAGNOSIS — R7881 Bacteremia: Secondary | ICD-10-CM

## 2017-05-08 DIAGNOSIS — R509 Fever, unspecified: Secondary | ICD-10-CM

## 2017-05-08 DIAGNOSIS — L8990 Pressure ulcer of unspecified site, unspecified stage: Secondary | ICD-10-CM

## 2017-05-08 DIAGNOSIS — E739 Lactose intolerance, unspecified: Secondary | ICD-10-CM

## 2017-05-08 LAB — BASIC METABOLIC PANEL
ANION GAP: 9 (ref 5–15)
BUN: 10 mg/dL (ref 6–20)
CHLORIDE: 102 mmol/L (ref 101–111)
CO2: 22 mmol/L (ref 22–32)
Calcium: 8.4 mg/dL — ABNORMAL LOW (ref 8.9–10.3)
Creatinine, Ser: 1.22 mg/dL — ABNORMAL HIGH (ref 0.44–1.00)
GFR calc Af Amer: 59 mL/min — ABNORMAL LOW (ref >60)
GFR calc non Af Amer: 51 mL/min — ABNORMAL LOW (ref >60)
GLUCOSE: 116 mg/dL — AB (ref 65–99)
POTASSIUM: 3.7 mmol/L (ref 3.5–5.1)
Sodium: 133 mmol/L — ABNORMAL LOW (ref 135–145)

## 2017-05-08 LAB — CBC
HEMATOCRIT: 26 % — AB (ref 36.0–46.0)
HEMOGLOBIN: 8.3 g/dL — AB (ref 12.0–15.0)
MCH: 28 pg (ref 26.0–34.0)
MCHC: 31.9 g/dL (ref 30.0–36.0)
MCV: 87.8 fL (ref 78.0–100.0)
Platelets: 259 10*3/uL (ref 150–400)
RBC: 2.96 MIL/uL — ABNORMAL LOW (ref 3.87–5.11)
RDW: 14.8 % (ref 11.5–15.5)
WBC: 17.8 10*3/uL — AB (ref 4.0–10.5)

## 2017-05-08 LAB — GLUCOSE, CAPILLARY
GLUCOSE-CAPILLARY: 254 mg/dL — AB (ref 65–99)
Glucose-Capillary: 170 mg/dL — ABNORMAL HIGH (ref 65–99)
Glucose-Capillary: 265 mg/dL — ABNORMAL HIGH (ref 65–99)
Glucose-Capillary: 257 mg/dL — ABNORMAL HIGH (ref 65–99)

## 2017-05-08 LAB — CULTURE, BLOOD (ROUTINE X 2): Special Requests: ADEQUATE

## 2017-05-08 MED ORDER — CEPHALEXIN 250 MG PO CAPS
250.0000 mg | ORAL_CAPSULE | Freq: Four times a day (QID) | ORAL | Status: DC
  Administered 2017-05-08 – 2017-05-10 (×8): 250 mg via ORAL
  Filled 2017-05-08 (×9): qty 1

## 2017-05-08 MED ORDER — SODIUM CHLORIDE 0.9 % IV BOLUS (SEPSIS)
500.0000 mL | Freq: Once | INTRAVENOUS | Status: AC
  Administered 2017-05-08: 500 mL via INTRAVENOUS

## 2017-05-08 MED ORDER — SODIUM CHLORIDE 0.9 % IV SOLN
600.0000 mg | INTRAVENOUS | Status: DC
  Administered 2017-05-08 – 2017-05-09 (×2): 600 mg via INTRAVENOUS
  Filled 2017-05-08 (×2): qty 12

## 2017-05-08 NOTE — Progress Notes (Signed)
Emptied only 50cc from foley catheter for the whole 12 hour shift. Bladder scan performed and got only 31cc in the bladder.MD on call text paged.[phone] Incoming RN made aware. Curley Fayette, Drinda Butts, Charity fundraiser

## 2017-05-08 NOTE — Progress Notes (Signed)
Subjective:  Fever of 102.1 overnight. Currently afebrile. Patient continues to complain of pain at sacral ulcer site. Denies abdominal pain and diarrhea. States she believes loose stools were from Glucerna that contains lactose and she is lactose-intolerant. States Reports IV Dilaudid helping as well as air mattress as it keeps pressure off her lower back. She received her first hydrotherapy session yesterday. Patient continues to ask when she will be discharged. Discussed with patient ongoing antibiotic therapy given ongoing fever and uptrending white count. She verbalized understanding and is in agreement with plan.   Objective:  Vital signs in last 24 hours: Vitals:   05/07/17 1637 05/07/17 1810 05/07/17 2100 05/08/17 0610  BP: (!) 144/62  (!) 115/51 122/70  Pulse: 83  69 74  Resp: 16  18 18   Temp: (!) 102.1 F (38.9 C) 100.1 F (37.8 C) 98.8 F (37.1 C) 99.3 F (37.4 C)  TempSrc: Oral  Oral Oral  SpO2: 97%  100% 99%  Weight:      Height:       Physical Exam  Constitutional: She is oriented to person, place, and time.  Thin, chronically ill appearing female. Lying flat in bed in no acute distress   Neck: Normal range of motion. Neck supple. No JVD present.  Cardiovascular: Normal rate and regular rhythm.  Murmur (II/VI systolic murmur ) heard. Pulmonary/Chest: Effort normal. No respiratory distress.  Abdominal: Soft. Bowel sounds are normal. She exhibits no distension. There is no tenderness.  Musculoskeletal: She exhibits no edema.  Neurological: She is alert and oriented to person, place, and time.    Assessment/Plan:  Principal Problem:   Bacteremia due to vancomycin resistant Enterococcus Active Problems:   Type 1 diabetes mellitus (HCC)   Multiple sclerosis (HCC)   Neurogenic bladder   Pressure injury of skin   Hyperglycemia   Bacteriuria   Proteus infection   Foley catheter in place on admission   Diarrhea  # VRE and Proteus Bacteremia: WBC continues to  trend up, WBC 17.8, and febrile to 102.1 overnight. Otherwise hemodynamically stable. Currently on Day 4 of IV Daptomycin and Rocephin. Per ID recommendations, we will continue to monitor fevers and avoid adding a carbapenem to cover for ESBL Klebsiella given that she is at high risk for C. difficile infection. - Continue IV Daptomycin and Rocephin (Day 4) - Follow up repeat blood cx - no growth at 24 hours  - Tylenol 650 mg PRN for fever  - Will hold on adding a carbapenem to cover for ESBL Klebsiella per ID recs as she is at increased risk of C diff.  # Oliguria: Patient with total UOP 50 cc overnight. Unclear etiology. She had an AKI on presentation, but renal function steadily improving. Will give 500 cc bolus and monitor UOP as she could be dehydrated from loose stools.  - 500 cc bolus    # Diarrhea: No further episodes of diarrhea since yesterday afternoon. Initially concern for C diff given recent history on 03/2017. However, patient reports no ongoing diarrhea or abdominal pain. Reports she is lactose-intolerant and Glucerna supplement provided has lactose. Reports similar episode in the past.  - Will continue to monitor and have low threshold to test in the future    #Bacteriuria:Admission urine sample was collected from already indwelling foley and analysis may represent colonization of foley itself. Repeat UA post foley exchange with pyuria, LE, and rare bacteria. Unfortunately, unable to obtain Ucx from this urine sample as it was not sent on appropriate  urine cup. Repeat Ucx from 3/10 with multiple species.   #Hyperglycemia #T1DM with evidence of insulin resistance Hyperglycemia improved. CBG 110-170.  -Continue Lantus 23U QHS - Novolog 6U TID with meals -Continue SSI and CBG monitoring  # Unstageable pressure ulcer:WOund care recommended hydrotherapy M-Sat with Santyl dressings after therapy and air replacement mattress  -Hydrotherapy M-Sat - IV Dilaudid 0.5 mg PRN  ordered to give prior to hydrotherapy  - Santyl dressings - Air mattress    #Multiple sclerosis: Patient with h/o aggressive MS w/o signs of current flare. She has an indwelling catheter secondary to neurogenic bladder. She is supposed to be on Rituxin therapy for maintenance, however this does not appear to have been done.  -Continue muscle relaxants - PT/OT consults for continued rehabilitation  Dispo: Anticipated discharge in approximately 2-4 day(s).   [name], MD 05/08/2017, 6:53 AM Pager: [phone]

## 2017-05-08 NOTE — Progress Notes (Signed)
Physical Therapy Treatment Patient Details Name: Debra [name] MRN: [id number] DOB: 09/14/1965 Today's Date: 05/08/2017    History of Present Illness Debra [name] is a 52yo female with PMH of diabetes, MS resulting in neurogenic bladder (with chronic indwelling foley), HTN, CKD III, recurrent UTIs and depression who presents from her nursing facility with elevated blood sugar. She was recently admitted from 2/14-2/26 for sepsis secondary to Proteus bacteremia, VRE UTI, requiring intubation 2/15-2/19 and pressors, as well as C. Diff infection.    PT Comments    Patient tolerated 20 degrees in tilt bed with weight bearing 35# with pain L calf due to positioned with more weight on L LE.  Able to tolerate 5 minutes.  Vitals stable as below.  Will continue with tilt and check with nursing next day for follow through.   Follow Up Recommendations  SNF     Equipment Recommendations  None recommended by PT    Recommendations for Other Services       Precautions / Restrictions Precautions Precautions: Fall Precaution Comments: patient reports has issues with orthostatic hypotension    Mobility  Bed Mobility               General bed mobility comments: tilt bed utilized for weight bearing and increasing upright tolerance: BP 0 degrees 106/49, HR 77; at 10 degrees tilt and 17# weight bearing BP 120/53 HR 77; moved to 15 degrees pt c/o R calf pain, attempted repositioning for increased L LE weight bearing, up to 20 degrees BP 114/51, HR 79, stayed up 5 minutes, but poorly tolerated, back to flat per pt request.    Transfers                    Ambulation/Gait                 Stairs            Wheelchair Mobility    Modified Rankin (Stroke Patients Only)       Balance                                            Cognition Arousal/Alertness: Awake/alert Behavior During Therapy: Flat affect Overall Cognitive Status: No family/caregiver  present to determine baseline cognitive functioning                                        Exercises      General Comments        Pertinent Vitals/Pain Faces Pain Scale: Hurts even more Pain Location: leges (R>L) Pain Descriptors / Indicators: Aching;Grimacing;Guarding Pain Intervention(s): Monitored during session;Repositioned;Limited activity within patient's tolerance    Home Living                      Prior Function            PT Goals (current goals can now be found in the care plan section) Progress towards PT goals: Progressing toward goals(initiated tile protocol)    Frequency    Min 3X/week      PT Plan Current plan remains appropriate    Co-evaluation              AM-PAC PT "6 Clicks" Daily Activity  Outcome Measure  Difficulty turning  over in bed (including adjusting bedclothes, sheets and blankets)?: Unable Difficulty moving from lying on back to sitting on the side of the bed? : Unable Difficulty sitting down on and standing up from a chair with arms (e.g., wheelchair, bedside commode, etc,.)?: Unable Help needed moving to and from a bed to chair (including a wheelchair)?: Total Help needed walking in hospital room?: Total Help needed climbing 3-5 steps with a railing? : Total 6 Click Score: 6    End of Session   Activity Tolerance: Patient limited by pain Patient left: in bed;with call bell/phone within reach   PT Visit Diagnosis: Muscle weakness (generalized) (M62.81);Adult, failure to thrive (R62.7)     Time: 8366-2947 PT Time Calculation (min) (ACUTE ONLY): 15 min  Charges:  $Therapeutic Activity: 8-22 mins                    G CodesSheran Lawless, [place] [phone] 05/08/2017    [name] 05/08/2017, 5:25 PM

## 2017-05-08 NOTE — Care Management Note (Signed)
Case Management Note  Patient Details  Name: Debra [name] MRN: [id number] Date of Birth: 12/18/1965  Subjective/Objective:   History of h/o aggressive MS; proteus, VRE and had ESBL Klebsiella in previous urine; Admitted for cx- now with VRE and proteus in Bcx,  Bacteremia due to vancomycin resistant Enterococcus/ Unstageable pressure ulcer  Action/Plan: Per ID recommendations, wewill continue to monitor fevers and avoid adding a carbapenemto cover for ESBL Klebsiella given that she is at high risk for C. difficile infection. 05/08/17 Currently on Day 4 of IV Daptomycin and Rocephin. Museum/gallery curator.  Unstageable pressure ulcer:Wound care recommended hydrotherapy M-Sat. Transition back to SNF when medically stable... Referral made to LCSW....Marland KitchenNCM following as need presents....  Expected Discharge Date:     TBD             Expected Discharge Plan:  Skilled Nursing Facility  In-House Referral:  Clinical Social Work  Discharge planning Services  CM Consult  Status of Service:  In process, will continue to follow  Yancey Flemings, RN  Nurse case manager [place] 05/08/2017, 3:26 PM

## 2017-05-08 NOTE — Progress Notes (Signed)
Physical Therapy Wound Treatment Patient Details  Name: Debra [name] MRN: [id number] Date of Birth: May 19, 1965  Today's Date: 05/08/2017 Time: 0950-1040 Time Calculation (min): 50 min  Subjective  Subjective: Pt interested in procedures taken by therapist for wound healing   Pain Score: Pt premedicated prior to treatment. 6/10 pain during treatment based on facial scale    Wound Assessment  Pressure Injury 05/07/17 Unstageable - Full thickness tissue loss in which the base of the ulcer is covered by slough (yellow, tan, gray, green or brown) and/or eschar (tan, brown or black) in the wound bed. Sacral Wound to R of midline (Active)  Dressing Type ABD;Moist to dry 05/08/2017 12:00 PM  Dressing Changed;Clean;Dry;Intact 05/08/2017 12:00 PM  Dressing Change Frequency Daily 05/08/2017 12:00 PM  State of Healing Eschar 05/08/2017 12:00 PM  Site / Wound Assessment Yellow 05/08/2017 12:00 PM  % Wound base Yellow/Fibrinous Exudate 100% 05/08/2017 12:00 PM  Peri-wound Assessment Intact 05/08/2017 12:00 PM  Wound Length (cm) 10.5 cm 05/07/2017  5:00 PM  Wound Width (cm) 4.5 cm 05/07/2017  5:00 PM  Wound Surface Area (cm^2) 47.25 cm^2 05/07/2017  5:00 PM  Margins Unattached edges (unapproximated) 05/08/2017 12:00 PM  Drainage Amount Minimal 05/08/2017 12:00 PM  Drainage Description Odor;Purulent 05/08/2017 12:00 PM  Treatment Cleansed;Debridement (Selective);Hydrotherapy (Pulse lavage);Packing (Saline gauze) 05/08/2017 12:00 PM  Santyl applied to wound bed prior to dressing  Hydrotherapy Pulsed lavage therapy - wound location: sacrum Pulsed Lavage with Suction (psi): 4 psi(4-12 psi) Pulsed Lavage with Suction - Normal Saline Used: 1000 mL Pulsed Lavage Tip: Tip with splash shield Selective Debridement Selective Debridement - Location: Sacrum Selective Debridement - Tools Used: Forceps;Scalpel Selective Debridement - Tissue Removed: Large quantity of softened slough removed from wound, still unable  to visualize the wound bed    Wound Assessment and Plan  Wound Therapy - Assess/Plan/Recommendations Wound Therapy - Clinical Statement: Slough over wound is beginning to breakdown and a large amount was debrided with scalpel to pt tolerance. Will benefit from continued hydrotherapy for removal fo slough to reduce bioburden and facilitate healing. Factors Delaying/Impairing Wound Healing: Diabetes Mellitus;Multiple medical problems;Immobility Hydrotherapy Plan: Debridement;Dressing change;Pulsatile lavage with suction;Patient/family education Wound Therapy - Frequency: 6X / week Wound Therapy - Follow Up Recommendations: Skilled nursing facility  Wound Therapy Goals- Improve the function of patient's integumentary system by progressing the wound(s) through the phases of wound healing (inflammation - proliferation - remodeling) by: Decrease Necrotic Tissue to: 20 Decrease Necrotic Tissue - Progress: Progressing toward goal Increase Granulation Tissue to: 80 Increase Granulation Tissue - Progress: Progressing toward goal Goals/treatment plan/discharge plan were made with and agreed upon by patient/family: Yes Time For Goal Achievement: 7 days Wound Therapy - Potential for Goals: Fair  Goals will be updated until maximal potential achieved or discharge criteria met.  Discharge criteria: when goals achieved, discharge from hospital, MD decision/surgical intervention, no progress towards goals, refusal/missing three consecutive treatments without notification or medical reason.  GP    [name]. [name], DPT Acute Rehabilitation  [phone] Pager [phone]   [place] 05/08/2017, 12:44 PM

## 2017-05-08 NOTE — Progress Notes (Addendum)
Pharmacy Antibiotic Note  Debra [name] is a 52 y.o. female admitted on 05/05/2017 with bacteremia and UTI.  Pharmacy has been consulted for Unasyn dosing.  Patient with history of proteus, VRE and had ESBL Klebsiella in previous urine cx- now with VRE and proteus in Bcx (1/2 for both). Patient has not had UTI symptoms and foley has been changed since last culture - suspect ESBL colonization.  Spiked fever yesterday afternoon, WBC rose to 17.8 this morning. SCr back down to baseline of 1.2.  Reviewed cultures with Dr. Luciana Axe in person this morning on the floor. Could consolidate to Unasyn, however patient has allergy to penicillin listed. Spoke with patient about penicillin allergy- she states it was years ago, however she distinctly remembers itching all over and a swollen throat/issues breathing. She does not remember what type of treatment she received. Dr. Luciana Axe recommends continuing daptomycin, and changing ceftriaxone to Keflex to reduce risk of CDiff.  Plan: Daptomycin 600mg  IV q24h  Keflex 250mg  PO q6h Follow c/s, clinical progression, renal function, LOT Weekly CK (on Saturdays)  Height: 5\' 5"  (165.1 cm) Weight: 138 lb 7.2 oz (62.8 kg) IBW/kg (Calculated) : 57  Temp (24hrs), Avg:99.8 F (37.7 C), Min:98.6 F (37 C), Max:102.1 F (38.9 C)  Recent Labs  Lab 05/05/17 0220 05/05/17 0300 05/05/17 0843 05/06/17 0618 05/07/17 0714 05/08/17 0448  WBC 10.8*  --   --  12.1* 13.9* 17.8*  CREATININE 1.32*  --   --  1.57* 1.30* 1.22*  LATICACIDVEN  --  2.03* 1.4  --   --   --     Estimated Creatinine Clearance: 49.6 mL/min (A) (by C-G formula based on SCr of 1.22 mg/dL (H)).    Allergies  Allergen Reactions  . Penicillins Anaphylaxis, Nausea And Vomiting and Rash    Has patient had a PCN reaction causing immediate rash, facial/tongue/throat swelling, SOB or lightheadedness with hypotension: Yes Has patient had a PCN reaction causing severe rash involving mucus membranes or  skin necrosis: Yes Has patient had a PCN reaction that required hospitalization No Has patient had a PCN reaction occurring within the last 10 years: Yes If all of the above answers are "NO", then may proceed with Cephalosporin use.   . Pollen Extract Other (See Comments)    Seasonal Allergies  . Tape Rash    Ceftriaxone 3/9>>3/11 Daptomycin 3/9>> Keflex 3/12>>  3/11 BCx: 3/10 Urine: mult species 3/9 Bcx: 1/2 proteus mirabilis (R amp), 1/2 VRE 3/9 BCID: VRE + proteus 3/9 MRSA PCR: neg 3/9 urine: mult species  Thank you for allowing pharmacy to be a part of this patient's care.  Ghalia Reicks D. Keisa Blow, PharmD, BCPS Clinical Pharmacist Clinical Phone for 05/08/2017 until 3:30pm: [phone] If after 3:30pm, please call main pharmacy at [phone] 05/08/2017 10:37 AM

## 2017-05-08 NOTE — Progress Notes (Signed)
[facility]   Reason for visit: Follow up on fever  Interval History: fever yesterday to 102, no associated chills, did not feel febrile;. WBC up to 17.8. No new symptoms.  No significant diarrhea reported by patient.    Physical Exam: Constitutional:  Vitals:   05/08/17 0610 05/08/17 0801  BP: 122/70 (!) 124/51  Pulse: 74 76  Resp: 18 18  Temp: 99.3 F (37.4 C) 98.6 F (37 C)  SpO2: 99% 100%   patient appears in NAD Eyes: anicteric HENT: no thrush Respiratory: Normal respiratory effort; CTA B Back: + decubitus ulcer  Review of Systems: Constitutional: negative for chills and anorexia Gastrointestinal: negative for diarrhea Integument/breast: negative for rash  Lab Results  Component Value Date   WBC 17.8 (H) 05/08/2017   HGB 8.3 (L) 05/08/2017   HCT 26.0 (L) 05/08/2017   MCV 87.8 05/08/2017   PLT 259 05/08/2017    Lab Results  Component Value Date   CREATININE 1.22 (H) 05/08/2017   BUN 10 05/08/2017   NA 133 (L) 05/08/2017   K 3.7 05/08/2017   CL 102 05/08/2017   CO2 22 05/08/2017    Lab Results  Component Value Date   ALT 12 (L) 05/05/2017   AST 14 (L) 05/05/2017   ALKPHOS 92 05/05/2017     Microbiology: Recent Results (from the past 240 hour(s))  Culture, blood (Routine X 2) w Reflex to ID Panel     Status: Abnormal   Collection Time: 05/05/17  2:50 AM  Result Value Ref Range Status   Specimen Description BLOOD RIGHT HAND  Final   Special Requests IN PEDIATRIC BOTTLE Blood Culture adequate volume  Final   Culture  Setup Time   Final    GRAM POSITIVE COCCI GRAM NEGATIVE RODS IN PEDIATRIC BOTTLE CRITICAL RESULT CALLED TO, READ BACK BY AND VERIFIED WITH: ESINCLAIR,PHARMD @1950  05/05/17 BY LHOWARD Performed at [facility], [address] [place], [place] [address]    Culture (A)  Final    PROTEUS MIRABILIS VANCOMYCIN RESISTANT ENTEROCOCCUS ISOLATED    Report Status 05/08/2017 FINAL  Final   Organism ID,  Bacteria PROTEUS MIRABILIS  Final   Organism ID, Bacteria VANCOMYCIN RESISTANT ENTEROCOCCUS ISOLATED  Final      Susceptibility   Proteus mirabilis - MIC*    AMPICILLIN >=32 RESISTANT Resistant     CEFAZOLIN <=4 SENSITIVE Sensitive     CEFEPIME <=1 SENSITIVE Sensitive     CEFTAZIDIME <=1 SENSITIVE Sensitive     CEFTRIAXONE <=1 SENSITIVE Sensitive     CIPROFLOXACIN <=0.25 SENSITIVE Sensitive     GENTAMICIN <=1 SENSITIVE Sensitive     IMIPENEM 0.5 SENSITIVE Sensitive     TRIMETH/SULFA <=20 SENSITIVE Sensitive     AMPICILLIN/SULBACTAM 4 SENSITIVE Sensitive     PIP/TAZO <=4 SENSITIVE Sensitive     * PROTEUS MIRABILIS   Vancomycin resistant enterococcus isolated - MIC*    AMPICILLIN <=2 SENSITIVE Sensitive     VANCOMYCIN >=32 RESISTANT Resistant     GENTAMICIN SYNERGY SENSITIVE Sensitive     LINEZOLID 2 SENSITIVE Sensitive     * VANCOMYCIN RESISTANT ENTEROCOCCUS ISOLATED  Blood Culture ID Panel (Reflexed)     Status: Abnormal   Collection Time: 05/05/17  2:50 AM  Result Value Ref Range Status   Enterococcus species DETECTED (A) NOT DETECTED Final    Comment: CRITICAL RESULT CALLED TO, READ BACK BY AND VERIFIED WITH: ESINCLAIR,PHARMD @1950  05/05/17 LHOWARD    Vancomycin resistance DETECTED (A)  NOT DETECTED Final    Comment: CRITICAL RESULT CALLED TO, READ BACK BY AND VERIFIED WITH: ESINCLAIR,PHARMD @1950  05/05/17 BY LHOWARD    Listeria monocytogenes NOT DETECTED NOT DETECTED Final   Staphylococcus species NOT DETECTED NOT DETECTED Final   Staphylococcus aureus NOT DETECTED NOT DETECTED Final   Streptococcus species NOT DETECTED NOT DETECTED Final   Streptococcus agalactiae NOT DETECTED NOT DETECTED Final   Streptococcus pneumoniae NOT DETECTED NOT DETECTED Final   Streptococcus pyogenes NOT DETECTED NOT DETECTED Final   Acinetobacter baumannii NOT DETECTED NOT DETECTED Final   Enterobacteriaceae species DETECTED (A) NOT DETECTED Final    Comment: Enterobacteriaceae represent a large  family of gram-negative bacteria, not a single organism. CRITICAL RESULT CALLED TO, READ BACK BY AND VERIFIED WITH: ESINCLAIR,PHARMD @1950  05/05/17 BY LHOWARD    Enterobacter cloacae complex NOT DETECTED NOT DETECTED Final   Escherichia coli NOT DETECTED NOT DETECTED Final   Klebsiella oxytoca NOT DETECTED NOT DETECTED Final   Klebsiella pneumoniae NOT DETECTED NOT DETECTED Final   Proteus species DETECTED (A) NOT DETECTED Final    Comment: CRITICAL RESULT CALLED TO, READ BACK BY AND VERIFIED WITH: ESINCLAIR,PHARMD @1950  05/05/17 BY LHOWARD    Serratia marcescens NOT DETECTED NOT DETECTED Final   Carbapenem resistance NOT DETECTED NOT DETECTED Final   Haemophilus influenzae NOT DETECTED NOT DETECTED Final   Neisseria meningitidis NOT DETECTED NOT DETECTED Final   Pseudomonas aeruginosa NOT DETECTED NOT DETECTED Final   Candida albicans NOT DETECTED NOT DETECTED Final   Candida glabrata NOT DETECTED NOT DETECTED Final   Candida krusei NOT DETECTED NOT DETECTED Final   Candida parapsilosis NOT DETECTED NOT DETECTED Final   Candida tropicalis NOT DETECTED NOT DETECTED Final    Comment: Performed at [facility], [address] [place], [place] [address]  Culture, blood (Routine X 2) w Reflex to ID Panel     Status: None (Preliminary result)   Collection Time: 05/05/17  4:31 AM  Result Value Ref Range Status   Specimen Description BLOOD RIGHT ARM  Final   Special Requests   Final    BOTTLES DRAWN AEROBIC AND ANAEROBIC Blood Culture adequate volume   Culture   Final    NO GROWTH 2 DAYS Performed at [facility], [address], [place], [place] [address]    Report Status PENDING  Incomplete  Urine Culture     Status: Abnormal   Collection Time: 05/05/17  5:33 AM  Result Value Ref Range Status   Specimen Description URINE, CATHETERIZED  Final   Special Requests   Final    NONE Performed at [facility], [address], [place], [place] [address]    Culture  MULTIPLE SPECIES PRESENT, SUGGEST RECOLLECTION (A)  Final   Report Status 05/06/2017 FINAL  Final  MRSA PCR Screening     Status: Abnormal   Collection Time: 05/05/17  6:55 AM  Result Value Ref Range Status   MRSA by PCR INVALID RESULTS, SPECIMEN SENT FOR CULTURE (A) NEGATIVE Final    Comment: RESULT CALLED TO, READ BACK BY AND VERIFIED WITH: A.RIO RN AT 1130 05/05/17 BY A.DAVIS        The GeneXpert MRSA Assay (FDA approved for NASAL specimens only), is one component of a comprehensive MRSA colonization surveillance program. It is not intended to diagnose MRSA infection nor to guide or monitor treatment for MRSA infections. Performed at [facility], [address], [place], [place] [address]   MRSA culture  Status: None   Collection Time: 05/05/17  6:55 AM  Result Value Ref Range Status   Specimen Description NASAL SWAB  Final   Special Requests NONE  Final   Culture   Final    NO MRSA DETECTED Performed at [facility], [address], [place], [place] [address]    Report Status 05/06/2017 FINAL  Final  MRSA PCR Screening     Status: None   Collection Time: 05/05/17 10:13 PM  Result Value Ref Range Status   MRSA by PCR NEGATIVE NEGATIVE Final    Comment:        The GeneXpert MRSA Assay (FDA approved for NASAL specimens only), is one component of a comprehensive MRSA colonization surveillance program. It is not intended to diagnose MRSA infection nor to guide or monitor treatment for MRSA infections. Performed at [facility], [address], [place], [place] [address]   Culture, Urine     Status: Abnormal   Collection Time: 05/06/17 12:09 PM  Result Value Ref Range Status   Specimen Description URINE, CLEAN CATCH  Final   Special Requests   Final    NONE Performed at [facility], [address] [place], [place] [address]    Culture MULTIPLE SPECIES PRESENT, SUGGEST RECOLLECTION (A)  Final   Report Status 05/07/2017 FINAL  Final     Impression/Plan:  1. Fever - I am not clear of the source of fever, urine certainly possible but no real associated symptoms.  Continue dapto for now, can use keflex for Proteus with less C diff risk than ceftriaxone.  Will change.    2.  Allergy - severe penicillin allergy confirmed with patient.  Tolerating ceftriaxone so will use keflex.    3.  Decubitus ulcer - getting wound care and will continue.    4.  Enterococcal bacteremia - c/w urinary source.  Repeat cultures ngtd.  TEE if repeat cultures are again positive, otherwise, not indicated.

## 2017-05-09 DIAGNOSIS — D72829 Elevated white blood cell count, unspecified: Secondary | ICD-10-CM

## 2017-05-09 LAB — BASIC METABOLIC PANEL WITH GFR
Anion gap: 10 (ref 5–15)
BUN: 11 mg/dL (ref 6–20)
CO2: 21 mmol/L — ABNORMAL LOW (ref 22–32)
Calcium: 8.4 mg/dL — ABNORMAL LOW (ref 8.9–10.3)
Chloride: 99 mmol/L — ABNORMAL LOW (ref 101–111)
Creatinine, Ser: 1.39 mg/dL — ABNORMAL HIGH (ref 0.44–1.00)
GFR calc Af Amer: 50 mL/min — ABNORMAL LOW
GFR calc non Af Amer: 43 mL/min — ABNORMAL LOW
Glucose, Bld: 294 mg/dL — ABNORMAL HIGH (ref 65–99)
Potassium: 3.9 mmol/L (ref 3.5–5.1)
Sodium: 130 mmol/L — ABNORMAL LOW (ref 135–145)

## 2017-05-09 LAB — CBC
HCT: 26.7 % — ABNORMAL LOW (ref 36.0–46.0)
Hemoglobin: 8.7 g/dL — ABNORMAL LOW (ref 12.0–15.0)
MCH: 28.7 pg (ref 26.0–34.0)
MCHC: 32.6 g/dL (ref 30.0–36.0)
MCV: 88.1 fL (ref 78.0–100.0)
Platelets: 304 K/uL (ref 150–400)
RBC: 3.03 MIL/uL — ABNORMAL LOW (ref 3.87–5.11)
RDW: 15.2 % (ref 11.5–15.5)
WBC: 19.6 K/uL — ABNORMAL HIGH (ref 4.0–10.5)

## 2017-05-09 LAB — GLUCOSE, CAPILLARY
GLUCOSE-CAPILLARY: 267 mg/dL — AB (ref 65–99)
GLUCOSE-CAPILLARY: 165 mg/dL — AB (ref 65–99)
Glucose-Capillary: 268 mg/dL — ABNORMAL HIGH (ref 65–99)
Glucose-Capillary: 97 mg/dL (ref 65–99)

## 2017-05-09 LAB — CK: Total CK: 418 U/L — ABNORMAL HIGH (ref 38–234)

## 2017-05-09 MED ORDER — HYDROMORPHONE HCL 1 MG/ML IJ SOLN
0.5000 mg | Freq: Once | INTRAMUSCULAR | Status: AC
  Administered 2017-05-09: 0.5 mg via INTRAVENOUS
  Filled 2017-05-09: qty 0.5

## 2017-05-09 MED ORDER — INSULIN GLARGINE 100 UNIT/ML ~~LOC~~ SOLN
25.0000 [IU] | Freq: Every day | SUBCUTANEOUS | Status: DC
  Administered 2017-05-09 – 2017-05-13 (×4): 25 [IU] via SUBCUTANEOUS
  Filled 2017-05-09 (×5): qty 0.25

## 2017-05-09 MED ORDER — HYDROMORPHONE HCL 1 MG/ML IJ SOLN
0.5000 mg | Freq: Three times a day (TID) | INTRAMUSCULAR | Status: DC
  Administered 2017-05-09: 0.5 mg via INTRAVENOUS
  Filled 2017-05-09: qty 0.5

## 2017-05-09 MED ORDER — INSULIN ASPART 100 UNIT/ML ~~LOC~~ SOLN
7.0000 [IU] | Freq: Three times a day (TID) | SUBCUTANEOUS | Status: DC
  Administered 2017-05-09 – 2017-05-14 (×7): 7 [IU] via SUBCUTANEOUS

## 2017-05-09 MED ORDER — HYDROMORPHONE HCL 1 MG/ML IJ SOLN
0.5000 mg | Freq: Three times a day (TID) | INTRAMUSCULAR | Status: DC | PRN
  Administered 2017-05-10 – 2017-05-12 (×3): 0.5 mg via INTRAVENOUS
  Filled 2017-05-09 (×3): qty 0.5

## 2017-05-09 NOTE — Progress Notes (Signed)
Physical Therapy Wound Treatment Patient Details  Name: Debra [name] MRN: [id number] Date of Birth: 07-21-1965  Today's Date: 05/09/2017 Time: 0920-0955 Time Calculation (min): 35 min  Subjective  Subjective: Pt interested in procedures taken by therapist for wound healing  Patient and Family Stated Goals: have wound heal and pain decrease  Pain Score:   Pt premedicated prior to treatment. Pt with facial pain score of 8/10 with treatment.  Wound Assessment  Pressure Injury 05/07/17 Unstageable - Full thickness tissue loss in which the base of the ulcer is covered by slough (yellow, tan, gray, green or brown) and/or eschar (tan, brown or black) in the wound bed. Sacral Wound to R of midline (Active)  Wound Image   05/09/2017 12:00 PM  Dressing Type ABD;Moist to dry 05/09/2017  5:00 PM  Dressing Changed;Clean;Dry;Intact 05/09/2017  5:00 PM  Dressing Change Frequency Daily 05/09/2017  5:00 PM  State of Healing Eschar 05/09/2017  5:00 PM  Site / Wound Assessment Yellow 05/09/2017  5:00 PM  % Wound base Yellow/Fibrinous Exudate 100% 05/09/2017  5:00 PM  Peri-wound Assessment Intact 05/09/2017  5:00 PM  Wound Length (cm) 10.5 cm 05/09/2017  5:00 PM  Wound Width (cm) 4.5 cm 05/09/2017  5:00 PM  Wound Depth (cm) 1.5 cm 05/09/2017  5:00 PM  Wound Surface Area (cm^2) 47.25 cm^2 05/09/2017  5:00 PM  Wound Volume (cm^3) 70.88 cm^3 05/09/2017  5:00 PM  Margins Unattached edges (unapproximated) 05/09/2017  5:00 PM  Drainage Amount Minimal 05/09/2017  5:00 PM  Drainage Description Odor;Purulent 05/09/2017  5:00 PM  Treatment Cleansed;Debridement (Selective);Hydrotherapy (Pulse lavage);Packing (Saline gauze) 05/09/2017  5:00 PM  Santyl applied to wound prior to dressing.  Hydrotherapy Pulsed lavage therapy - wound location: sacrum Pulsed Lavage with Suction (psi): 4 psi(4-12 psi) Pulsed Lavage with Suction - Normal Saline Used: 1000 mL Pulsed Lavage Tip: Tip with splash shield Selective  Debridement Selective Debridement - Location: Sacrum Selective Debridement - Tools Used: Forceps;Scalpel Selective Debridement - Tissue Removed: Continue to remove large quantites of slough, still unable to visualize wound bed   Wound Assessment and Plan  Wound Therapy - Assess/Plan/Recommendations Wound Therapy - Clinical Statement: Slough over wound is beginning to breakdown and a large amount was debrided with scalpel to pt tolerance. Will benefit from continued hydrotherapy for removal fo slough to reduce bioburden and facilitate healing. Factors Delaying/Impairing Wound Healing: Diabetes Mellitus;Multiple medical problems;Immobility Hydrotherapy Plan: Debridement;Dressing change;Pulsatile lavage with suction;Patient/family education Wound Therapy - Frequency: 6X / week Wound Therapy - Follow Up Recommendations: Skilled nursing facility Wound Plan: see above  Wound Therapy Goals- Improve the function of patient's integumentary system by progressing the wound(s) through the phases of wound healing (inflammation - proliferation - remodeling) by: Decrease Necrotic Tissue to: 20 Decrease Necrotic Tissue - Progress: Not met Increase Granulation Tissue to: 80 Increase Granulation Tissue - Progress: Not met Goals/treatment plan/discharge plan were made with and agreed upon by patient/family: Yes Time For Goal Achievement: 7 days Wound Therapy - Potential for Goals: Fair  Goals will be updated until maximal potential achieved or discharge criteria met.  Discharge criteria: when goals achieved, discharge from hospital, MD decision/surgical intervention, no progress towards goals, refusal/missing three consecutive treatments without notification or medical reason.  GP    [name]. [name], DPT Acute Rehabilitation  [phone] Pager [phone]   [place] 05/09/2017, 5:04 PM

## 2017-05-09 NOTE — Progress Notes (Signed)
Occupational Therapy Treatment Patient Details Name: Debra [name] MRN: [id number] DOB: 13-Oct-1965 Today's Date: 05/09/2017    History of present illness [name] is a 52yo female with PMH of diabetes, MS resulting in neurogenic bladder (with chronic indwelling foley), HTN, CKD III, recurrent UTIs and depression who presents from her nursing facility with elevated blood sugar. She was recently admitted from 2/14-2/26 for sepsis secondary to Proteus bacteremia, VRE UTI, requiring intubation 2/15-2/19 and pressors, as well as C. Diff infection.   OT comments  Focus of session on self feeding, grooming and UB exercise at bed level. Pt with sacral pain and discomfort when HOB raised for ADL.   Follow Up Recommendations  SNF    Equipment Recommendations  None recommended by OT    Recommendations for Other Services      Precautions / Restrictions Precautions Precautions: Fall Precaution Comments: patient reports has issues with orthostatic hypotension Restrictions Weight Bearing Restrictions: No       Mobility Bed Mobility                  Transfers                      Balance                                           ADL either performed or assessed with clinical judgement   ADL Overall ADL's : Needs assistance/impaired Eating/Feeding: Minimal assistance;Bed level Eating/Feeding Details (indicate cue type and reason): assist to open packages, cut food, increased spillage as pt unable to tolerate upright position in bed with sacral wound Grooming: Wash/dry hands;Wash/dry face;Bed level;Set up                                       Vision       Perception     Praxis      Cognition Arousal/Alertness: Awake/alert Behavior During Therapy: Flat affect Overall Cognitive Status: Within Functional Limits for tasks assessed                                          Exercises Exercises: General Upper  Extremity General Exercises - Upper Extremity Shoulder Flexion: AAROM;10 reps;Both;Supine Elbow Flexion: Strengthening;10 reps;Supine;Bar weights/barbell Bar Weights/Barbell (Elbow Flexion): 1 lb Elbow Extension: Strengthening;Both;10 reps;Supine;Theraband Theraband Level (Elbow Extension): Level 1 (Yellow)   Shoulder Instructions       General Comments      Pertinent Vitals/ Pain       Pain Assessment: Faces Faces Pain Scale: Hurts even more Pain Location: sacrum Pain Descriptors / Indicators: Grimacing;Guarding;Sore Pain Intervention(s): Monitored during session;Repositioned  Home Living                                          Prior Functioning/Environment              Frequency  Min 2X/week        Progress Toward Goals  OT Goals(current goals can now be found in the care plan section)  Progress towards OT goals: Progressing toward goals  Acute Rehab OT Goals Patient Stated Goal: decrease pain OT Goal Formulation: With patient Time For Goal Achievement: 05/20/17 Potential to Achieve Goals: Fair  Plan Discharge plan remains appropriate    Co-evaluation                 AM-PAC PT "6 Clicks" Daily Activity     Outcome Measure   Help from another person eating meals?: A Little Help from another person taking care of personal grooming?: A Little Help from another person toileting, which includes using toliet, bedpan, or urinal?: Total Help from another person bathing (including washing, rinsing, drying)?: A Lot Help from another person to put on and taking off regular upper body clothing?: A Lot Help from another person to put on and taking off regular lower body clothing?: Total 6 Click Score: 12    End of Session    OT Visit Diagnosis: Muscle weakness (generalized) (M62.81);Pain   Activity Tolerance Patient limited by pain   Patient Left in bed;with call bell/phone within reach   Nurse Communication          Time:  1517-6160 OT Time Calculation (min): 17 min  Charges: OT Treatments $Self Care/Home Management : 8-22 mins  05/09/2017 Martie Round, OTR/L Pager: [phone] [place] Planas, [place] B[place]iff 05/09/2017, 1:44 PM

## 2017-05-09 NOTE — Plan of Care (Signed)
  Clinical Measurements: Will remain free from infection 05/09/2017 0500 - Progressing by Olena Mater, RN Note Antibiotics given as ordered; hydrotherapy of wounds.

## 2017-05-09 NOTE — Progress Notes (Signed)
Physical Therapy Treatment Patient Details Name: Debra [name] MRN: [id number] DOB: 12/24/1965 Today's Date: 05/09/2017    History of Present Illness Debra [name] is a 52yo female with PMH of diabetes, Debra resulting in neurogenic bladder (with chronic indwelling foley), HTN, CKD III, recurrent UTIs and depression who presents from her nursing facility with elevated blood sugar. She was recently admitted from 2/14-2/26 for sepsis secondary to Proteus bacteremia, VRE UTI, requiring intubation 2/15-2/19 and pressors, as well as C. Diff infection.    PT Comments    Revisited the vital go bed's tilting with Debra [name].  Debra [name] was amenable to trying the vital go bed, but was not outwardly enthusiastic.   At 15*, her R calf started to hurt again and she was assured that her legs would get used to the weight bearing.  BP dropped mildly at 10-15*, but generally stable.   Follow Up Recommendations  SNF     Equipment Recommendations  None recommended by PT    Recommendations for Other Services       Precautions / Restrictions Precautions Precautions: Fall Precaution Comments: patient reports has issues with orthostatic hypotension    Mobility  Bed Mobility Overal bed mobility: Needs Assistance             General bed mobility comments: tilt bed used for weight bearing and to get pt more upright for all the physiological benefits of being upright.  Initial BP 150/80, Initial tilt to 10* and BP at 130/80's,  at 15*  , pt related that R calve was starting to hurt.  Pt did not tolerate 22* for long due to increased pain.  Total time up in tilt was 15 minutes with pt generally tolerating tilt well.  Transfers                    Ambulation/Gait                 Stairs            Wheelchair Mobility    Modified Rankin (Stroke Patients Only)       Balance                                            Cognition Arousal/Alertness:  Awake/alert Behavior During Therapy: Flat affect Overall Cognitive Status: Within Functional Limits for tasks assessed                                        Exercises General Exercises - Upper Extremity Shoulder Flexion: AAROM;10 reps;Both;Supine Elbow Flexion: Strengthening;10 reps;Supine;Bar weights/barbell Bar Weights/Barbell (Elbow Flexion): 1 lb Elbow Extension: Strengthening;Both;10 reps;Supine;Theraband Theraband Level (Elbow Extension): Level 1 (Yellow) Other Exercises Other Exercises: bicep/tricep pressess bil x10 reps Other Exercises: 5 reps of PNF for shoulder from up and adb to down and add. bil    General Comments General comments (skin integrity, edema, etc.): pt also report mild dizziness when up at about 20*  Pt was generally flat and didn't volunteer any information about how things were going.      Pertinent Vitals/Pain Pain Assessment: 0-10 Pain Score: 7  Faces Pain Scale: Hurts even more Pain Location: R calf, sacrum Pain Descriptors / Indicators: Grimacing;Discomfort Pain Intervention(s): Monitored during session    Home Living  Prior Function            PT Goals (current goals can now be found in the care plan section) Acute Rehab PT Goals Patient Stated Goal: decrease pain PT Goal Formulation: With patient Time For Goal Achievement: 05/20/17 Potential to Achieve Goals: Fair Progress towards PT goals: Progressing toward goals    Frequency    Min 3X/week      PT Plan Current plan remains appropriate    Co-evaluation              AM-PAC PT "6 Clicks" Daily Activity  Outcome Measure  Difficulty turning over in bed (including adjusting bedclothes, sheets and blankets)?: Unable Difficulty moving from lying on back to sitting on the side of the bed? : Unable Difficulty sitting down on and standing up from a chair with arms (e.g., wheelchair, bedside commode, etc,.)?: Unable Help needed  moving to and from a bed to chair (including a wheelchair)?: Total Help needed walking in hospital room?: Total Help needed climbing 3-5 steps with a railing? : Total 6 Click Score: 6    End of Session   Activity Tolerance: Patient limited by pain Patient left: in bed;with call bell/phone within reach   PT Visit Diagnosis: Muscle weakness (generalized) (M62.81);Adult, failure to thrive (R62.7)     Time: 2446-2863 PT Time Calculation (min) (ACUTE ONLY): 36 min  Charges:  $Therapeutic Exercise: 8-22 mins $Therapeutic Activity: 8-22 mins                    G Codes:       05-21-2017  [place] Bing, PT [phone]  (pager)   Eliseo Gum Samson Ralph 05-21-2017, 4:32 PM

## 2017-05-09 NOTE — Progress Notes (Signed)
CRITICAL VALUE ALERT  Critical Value: lactic acid 2.1  Date & Time Notied:  05/08/17 2214  Provider Notified: Odessa Fleming  Orders Received/Actions taken: yes

## 2017-05-09 NOTE — Progress Notes (Signed)
Subjective:  Patient afebrile overnight, no acute events overnight. She continues to complain of pain at sacral area, but is otherwise doing well.  She denies chest pain, shortness of breath, cough, or ongoing loose stools.  Discussed with patient will continue IV antibiotic therapy for bacteremia.  Also discussed we are continue to monitor her due to unexplained elevated white count.  She verbalized understanding and is in agreement with plan.  All questions answered.  Objective:  Vital signs in last 24 hours: Vitals:   05/08/17 1635 05/08/17 2103 05/09/17 0547 05/09/17 0900  BP: (!) 128/52 129/61 (!) 124/91 122/82  Pulse: 80 82 85 84  Resp: 18 18 18 18   Temp: 100.1 F (37.8 C) 99.8 F (37.7 C) 99.5 F (37.5 C) 98.8 F (37.1 C)  TempSrc: Oral Oral Oral Oral  SpO2: 99% 99% 99% 98%  Weight:  138 lb 7.2 oz (62.8 kg)    Height:       Physical Exam  Constitutional: She is oriented to person, place, and time. She appears well-developed and well-nourished. No distress.  Cardiovascular: Normal rate and regular rhythm.  Murmur (II/VI systolic murmur best heard at USB) heard. Pulmonary/Chest: Effort normal. No respiratory distress.  Abdominal: Soft. Bowel sounds are normal. She exhibits no distension. There is no tenderness.  Musculoskeletal: She exhibits no edema.  Neurological: She is alert and oriented to person, place, and time.    Assessment/Plan:  Principal Problem:   Bacteremia due to vancomycin resistant Enterococcus Active Problems:   Type 1 diabetes mellitus (HCC)   Multiple sclerosis (HCC)   Neurogenic bladder   Pressure injury of skin   Hyperglycemia   Bacteriuria   Proteus infection   Foley catheter in place on admission   Diarrhea  # VRE and Proteus Bacteremia:WBC continues to trend up, WBC 19.6. Afebrile and otherwise hemodynamically stable without complaints other than pain at sacral ulcer site. Bcx negative to date.Currently on Day 5 of IV Daptomycin and Day  2 of Keflex. Leukocytosis source remains unclear. Will order TTE for further evaluation of  IE given Enterococcus bacteremia. Can consider MRI in future if high suspicion for osteomyelitis. POC renal U/S without evidence suggestive of pyelonephritis.  - ID following, appreciate recommendations  - Continue IVDaptomycin (Day 5) and Keflex (Day2) - Follow up repeat blood cx - no growth at 48 hours  - Tylenol 650 mg PRN for fever - Will hold on adding a carbapenem to cover for ESBL Klebsiellaper ID recsas she is at increased risk of C diff. - F/u TTE    # Oliguria: Resolved. UOP 1.1 L yesterday. Will continue to monitor in the setting of uptrending Creatinine, 1.2-->1.3.  - BMP in AM   # Diarrhea: Resolved    #Bacteriuria:Admission urine sample was collected from already indwelling foley and analysis may represent colonization of foley itself. Repeat UA post foley exchange with pyuria, LE, and rare bacteria. Unfortunately, unable to obtain Ucx from this urine sample as it was not sent on appropriate urine cup. Repeat Ucx from 3/10 with multiple species.   #Hyperglycemia #T1DM with evidence of insulin resistance  - Lantus 23U--> 25 QHS -Novolog 7UTID with meals -Continue SSI and CBG monitoring  # Unstageable pressure ulcer:WOund care recommended hydrotherapy M-Sat with Santyl dressings after therapy and air replacement mattress -Hydrotherapy M-Sat - IV Dilaudid 0.5 mg PRN ordered to give prior to hydrotherapy  - Santyl dressings - Air mattress    #Multiple sclerosis: Patient with h/o aggressive MS w/o signs of  current flare.She has an indwelling catheter secondary to neurogenic bladder.She issupposed to be on Rituxin therapy for maintenance, however this does not appear to have been done.  -Continue muscle relaxants - PT/OT consults for continued rehabilitation   Dispo: Anticipated discharge in approximately 2-4 day(s).   Debra Cash, MD 05/09/2017,  12:22 PM Pager: [phone]

## 2017-05-10 ENCOUNTER — Inpatient Hospital Stay (HOSPITAL_COMMUNITY): Payer: Medicaid Other

## 2017-05-10 DIAGNOSIS — R5383 Other fatigue: Secondary | ICD-10-CM

## 2017-05-10 DIAGNOSIS — R011 Cardiac murmur, unspecified: Secondary | ICD-10-CM

## 2017-05-10 DIAGNOSIS — R34 Anuria and oliguria: Secondary | ICD-10-CM

## 2017-05-10 DIAGNOSIS — R5381 Other malaise: Secondary | ICD-10-CM

## 2017-05-10 DIAGNOSIS — I350 Nonrheumatic aortic (valve) stenosis: Secondary | ICD-10-CM

## 2017-05-10 LAB — CBC
HCT: 27.5 % — ABNORMAL LOW (ref 36.0–46.0)
Hemoglobin: 8.7 g/dL — ABNORMAL LOW (ref 12.0–15.0)
MCH: 27.7 pg (ref 26.0–34.0)
MCHC: 31.6 g/dL (ref 30.0–36.0)
MCV: 87.6 fL (ref 78.0–100.0)
Platelets: 326 10*3/uL (ref 150–400)
RBC: 3.14 MIL/uL — AB (ref 3.87–5.11)
RDW: 15.2 % (ref 11.5–15.5)
WBC: 29.1 10*3/uL — ABNORMAL HIGH (ref 4.0–10.5)

## 2017-05-10 LAB — BASIC METABOLIC PANEL
Anion gap: 12 (ref 5–15)
BUN: 15 mg/dL (ref 6–20)
CO2: 18 mmol/L — AB (ref 22–32)
Calcium: 8.8 mg/dL — ABNORMAL LOW (ref 8.9–10.3)
Chloride: 99 mmol/L — ABNORMAL LOW (ref 101–111)
Creatinine, Ser: 1.5 mg/dL — ABNORMAL HIGH (ref 0.44–1.00)
GFR calc non Af Amer: 40 mL/min — ABNORMAL LOW (ref >60)
GFR, EST AFRICAN AMERICAN: 46 mL/min — AB (ref >60)
Glucose, Bld: 145 mg/dL — ABNORMAL HIGH (ref 65–99)
Potassium: 4.2 mmol/L (ref 3.5–5.1)
SODIUM: 129 mmol/L — AB (ref 135–145)

## 2017-05-10 LAB — CULTURE, BLOOD (ROUTINE X 2)
Culture: NO GROWTH
Special Requests: ADEQUATE

## 2017-05-10 LAB — GLUCOSE, CAPILLARY
Glucose-Capillary: 141 mg/dL — ABNORMAL HIGH (ref 65–99)
Glucose-Capillary: 152 mg/dL — ABNORMAL HIGH (ref 65–99)
Glucose-Capillary: 115 mg/dL — ABNORMAL HIGH (ref 65–99)
Glucose-Capillary: 209 mg/dL — ABNORMAL HIGH (ref 65–99)
Glucose-Capillary: 196 mg/dL — ABNORMAL HIGH (ref 65–99)

## 2017-05-10 LAB — ECHOCARDIOGRAM COMPLETE
Height: 65 in
WEIGHTICAEL: 2215.18 [oz_av]

## 2017-05-10 MED ORDER — HYDROMORPHONE HCL 1 MG/ML IJ SOLN
1.0000 mg | Freq: Once | INTRAMUSCULAR | Status: AC
  Administered 2017-05-10: 1 mg via INTRAVENOUS
  Filled 2017-05-10: qty 1

## 2017-05-10 MED ORDER — IOPAMIDOL (ISOVUE-300) INJECTION 61%
INTRAVENOUS | Status: AC
  Administered 2017-05-10: 12:00:00
  Filled 2017-05-10: qty 30

## 2017-05-10 MED ORDER — IOPAMIDOL (ISOVUE-300) INJECTION 61%
INTRAVENOUS | Status: AC
  Administered 2017-05-10: 80 mL
  Filled 2017-05-10: qty 100

## 2017-05-10 NOTE — Progress Notes (Signed)
PT Cancellation Note  Patient Details Name: Debra [name] MRN: [id number] DOB: October 13, 1965   Cancelled Treatment:    Reason Eval/Treat Not Completed: (P) Patient at procedure or test/unavailable Pt off floor. PT will follow back tomorrow for treatment.  Mujahid Jalomo B. Beverely Risen PT, DPT Acute Rehabilitation  [phone] Pager [phone]   Debra [name] 05/10/2017, 3:20 PM

## 2017-05-10 NOTE — Progress Notes (Signed)
[facility]   Reason for visit: Follow up on fever  Interval History: fever to 100.7; WBC up to 29 today.  No new source.  Patient seen 3/13, late entry note.  No complaints.  No acute events overnight.   Physical Exam: Constitutional:  Vitals:   05/09/17 2045 05/10/17 0508  BP:  (!) 128/52  Pulse:  81  Resp:  20  Temp: 98.4 F (36.9 C) 98.8 F (37.1 C)  SpO2:  99%   patient appears in NAD Eyes: anicteric Respiratory: Normal respiratory effort; CTA B Back: + decubitus ulcer  Review of Systems: Constitutional: negative for chills and anorexia Gastrointestinal: negative for diarrhea Integument/breast: negative for rash  Lab Results  Component Value Date   WBC 29.1 (H) 05/10/2017   HGB 8.7 (L) 05/10/2017   HCT 27.5 (L) 05/10/2017   MCV 87.6 05/10/2017   PLT 326 05/10/2017    Lab Results  Component Value Date   CREATININE 1.50 (H) 05/10/2017   BUN 15 05/10/2017   NA 129 (L) 05/10/2017   K 4.2 05/10/2017   CL 99 (L) 05/10/2017   CO2 18 (L) 05/10/2017    Lab Results  Component Value Date   ALT 12 (L) 05/05/2017   AST 14 (L) 05/05/2017   ALKPHOS 92 05/05/2017     Microbiology: Recent Results (from the past 240 hour(s))  Culture, blood (Routine X 2) w Reflex to ID Panel     Status: Abnormal   Collection Time: 05/05/17  2:50 AM  Result Value Ref Range Status   Specimen Description BLOOD RIGHT HAND  Final   Special Requests IN PEDIATRIC BOTTLE Blood Culture adequate volume  Final   Culture  Setup Time   Final    GRAM POSITIVE COCCI GRAM NEGATIVE RODS IN PEDIATRIC BOTTLE CRITICAL RESULT CALLED TO, READ BACK BY AND VERIFIED WITH: ESINCLAIR,PHARMD @1950  05/05/17 BY LHOWARD Performed at [facility], [address], [place], [place] [address]    Culture (A)  Final    PROTEUS MIRABILIS VANCOMYCIN RESISTANT ENTEROCOCCUS ISOLATED    Report Status 05/08/2017 FINAL  Final   Organism ID, Bacteria PROTEUS MIRABILIS  Final   Organism ID, Bacteria VANCOMYCIN RESISTANT ENTEROCOCCUS ISOLATED  Final      Susceptibility   Proteus mirabilis - MIC*    AMPICILLIN >=32 RESISTANT Resistant     CEFAZOLIN <=4 SENSITIVE Sensitive     CEFEPIME <=1 SENSITIVE Sensitive     CEFTAZIDIME <=1 SENSITIVE Sensitive     CEFTRIAXONE <=1 SENSITIVE Sensitive     CIPROFLOXACIN <=0.25 SENSITIVE Sensitive     GENTAMICIN <=1 SENSITIVE Sensitive     IMIPENEM 0.5 SENSITIVE Sensitive     TRIMETH/SULFA <=20 SENSITIVE Sensitive     AMPICILLIN/SULBACTAM 4 SENSITIVE Sensitive     PIP/TAZO <=4 SENSITIVE Sensitive     * PROTEUS MIRABILIS   Vancomycin resistant enterococcus isolated - MIC*    AMPICILLIN <=2 SENSITIVE Sensitive     VANCOMYCIN >=32 RESISTANT Resistant     GENTAMICIN SYNERGY SENSITIVE Sensitive     LINEZOLID 2 SENSITIVE Sensitive     * VANCOMYCIN RESISTANT ENTEROCOCCUS ISOLATED  Blood Culture ID Panel (Reflexed)     Status: Abnormal   Collection Time: 05/05/17  2:50 AM  Result Value Ref Range Status   Enterococcus species DETECTED (A) NOT DETECTED Final    Comment: CRITICAL RESULT CALLED TO, READ BACK BY AND VERIFIED WITH: ESINCLAIR,PHARMD @1950  05/05/17 LHOWARD    Vancomycin resistance DETECTED (A) NOT  DETECTED Final    Comment: CRITICAL RESULT CALLED TO, READ BACK BY AND VERIFIED WITH: ESINCLAIR,PHARMD @1950  05/05/17 BY LHOWARD    Listeria monocytogenes NOT DETECTED NOT DETECTED Final   Staphylococcus species NOT DETECTED NOT DETECTED Final   Staphylococcus aureus NOT DETECTED NOT DETECTED Final   Streptococcus species NOT DETECTED NOT DETECTED Final   Streptococcus agalactiae NOT DETECTED NOT DETECTED Final   Streptococcus pneumoniae NOT DETECTED NOT DETECTED Final   Streptococcus pyogenes NOT DETECTED NOT DETECTED Final   Acinetobacter baumannii NOT DETECTED NOT DETECTED Final   Enterobacteriaceae species DETECTED (A) NOT DETECTED Final    Comment: Enterobacteriaceae represent a large family of gram-negative bacteria,  not a single organism. CRITICAL RESULT CALLED TO, READ BACK BY AND VERIFIED WITH: ESINCLAIR,PHARMD @1950  05/05/17 BY LHOWARD    Enterobacter cloacae complex NOT DETECTED NOT DETECTED Final   Escherichia coli NOT DETECTED NOT DETECTED Final   Klebsiella oxytoca NOT DETECTED NOT DETECTED Final   Klebsiella pneumoniae NOT DETECTED NOT DETECTED Final   Proteus species DETECTED (A) NOT DETECTED Final    Comment: CRITICAL RESULT CALLED TO, READ BACK BY AND VERIFIED WITH: ESINCLAIR,PHARMD @1950  05/05/17 BY LHOWARD    Serratia marcescens NOT DETECTED NOT DETECTED Final   Carbapenem resistance NOT DETECTED NOT DETECTED Final   Haemophilus influenzae NOT DETECTED NOT DETECTED Final   Neisseria meningitidis NOT DETECTED NOT DETECTED Final   Pseudomonas aeruginosa NOT DETECTED NOT DETECTED Final   Candida albicans NOT DETECTED NOT DETECTED Final   Candida glabrata NOT DETECTED NOT DETECTED Final   Candida krusei NOT DETECTED NOT DETECTED Final   Candida parapsilosis NOT DETECTED NOT DETECTED Final   Candida tropicalis NOT DETECTED NOT DETECTED Final    Comment: Performed at [facility], [address], [place], [place] [address]  Culture, blood (Routine X 2) w Reflex to ID Panel     Status: None (Preliminary result)   Collection Time: 05/05/17  4:31 AM  Result Value Ref Range Status   Specimen Description BLOOD RIGHT ARM  Final   Special Requests   Final    BOTTLES DRAWN AEROBIC AND ANAEROBIC Blood Culture adequate volume   Culture   Final    NO GROWTH 4 DAYS Performed at [facility], [address] [place], [place] [address]    Report Status PENDING  Incomplete  Urine Culture     Status: Abnormal   Collection Time: 05/05/17  5:33 AM  Result Value Ref Range Status   Specimen Description URINE, CATHETERIZED  Final   Special Requests   Final    NONE Performed at [facility], [address], [place], [place] [address]    Culture MULTIPLE SPECIES PRESENT, SUGGEST  RECOLLECTION (A)  Final   Report Status 05/06/2017 FINAL  Final  MRSA PCR Screening     Status: Abnormal   Collection Time: 05/05/17  6:55 AM  Result Value Ref Range Status   MRSA by PCR INVALID RESULTS, SPECIMEN SENT FOR CULTURE (A) NEGATIVE Final    Comment: RESULT CALLED TO, READ BACK BY AND VERIFIED WITH: A.RIO RN AT 1130 05/05/17 BY A.DAVIS        The GeneXpert MRSA Assay (FDA approved for NASAL specimens only), is one component of a comprehensive MRSA colonization surveillance program. It is not intended to diagnose MRSA infection nor to guide or monitor treatment for MRSA infections. Performed at [facility], [address], [place], [place] [address]   MRSA culture  Status: None   Collection Time: 05/05/17  6:55 AM  Result Value Ref Range Status   Specimen Description NASAL SWAB  Final   Special Requests NONE  Final   Culture   Final    NO MRSA DETECTED Performed at [facility], [address], [place], [place] [address]    Report Status 05/06/2017 FINAL  Final  MRSA PCR Screening     Status: None   Collection Time: 05/05/17 10:13 PM  Result Value Ref Range Status   MRSA by PCR NEGATIVE NEGATIVE Final    Comment:        The GeneXpert MRSA Assay (FDA approved for NASAL specimens only), is one component of a comprehensive MRSA colonization surveillance program. It is not intended to diagnose MRSA infection nor to guide or monitor treatment for MRSA infections. Performed at [facility], [address], [place], [place] [address]   Culture, Urine     Status: Abnormal   Collection Time: 05/06/17 12:09 PM  Result Value Ref Range Status   Specimen Description URINE, CLEAN CATCH  Final   Special Requests   Final    NONE Performed at [facility], [address], [place], [place] [address]    Culture MULTIPLE SPECIES PRESENT, SUGGEST RECOLLECTION (A)  Final   Report Status 05/07/2017 FINAL  Final  Culture, blood (routine x 2)      Status: None (Preliminary result)   Collection Time: 05/07/17  2:41 PM  Result Value Ref Range Status   Specimen Description BLOOD RIGHT ANTECUBITAL  Final   Special Requests   Final    BOTTLES DRAWN AEROBIC ONLY Blood Culture adequate volume   Culture   Final    NO GROWTH 2 DAYS Performed at [facility], [address] [place], [place] [address]    Report Status PENDING  Incomplete  Culture, blood (routine x 2)     Status: None (Preliminary result)   Collection Time: 05/07/17  2:48 PM  Result Value Ref Range Status   Specimen Description BLOOD RIGHT ANTECUBITAL  Final   Special Requests   Final    BOTTLES DRAWN AEROBIC ONLY Blood Culture adequate volume   Culture   Final    NO GROWTH 2 DAYS Performed at [facility], [address] [place], [place] [address]    Report Status PENDING  Incomplete    Impression/Plan:  1. Fever - unclear etiology but I suspect now with hydrotherapy having some transient inflammation.  At this time, since she has been doing well, I will stop the antibiotics and observe off of antibiotics.     2.  Decubitus ulcer - getting wound care and will continue.    4.  Leukocytosis - increasing but no new source of infection identified.  As in #1, may be transient increase with wound care.

## 2017-05-10 NOTE — Progress Notes (Signed)
  Echocardiogram 2D Echocardiogram has been performed.  Debra [name] 05/10/2017, 1:59 PM

## 2017-05-10 NOTE — NC FL2 (Signed)
[place] MEDICAID [id number] LEVEL OF CARE SCREENING TOOL     IDENTIFICATION  Patient Name: Debra [name] Birthdate: Nov 07, 1965 Sex: female Admission Date (Current Location): 05/05/2017  [place] and [place] Number:  [place]nes Bast [id number] Facility and Address:  [facility], [address] [place], [place] [address]      Provider Number: [id number]  Attending Physician Name and Address:  Gust Rung, DO  Relative Name and Phone Number:  Nehal Koleszar (daughter) [phone], Louisa Second (son) [phone]    Current Level of Care: SNF Recommended Level of Care: Skilled Nursing Facility Prior Approval Number:    Date Approved/Denied:   PASRR Number: [id number]  Discharge Plan: SNF    Current Diagnoses: Patient Active Problem List   Diagnosis Date Noted  . Diarrhea 05/07/2017  . 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 Proteus infection 05/06/2017  . Foley catheter in place on admission 05/06/2017  . Hyperglycemia 05/05/2017  . Bacteriuria 05/05/2017  . Recurrent UTI   . Palliative care encounter   . Goals of care, counseling/discussion   . Pressure injury of skin 04/14/2017  . Aortic atherosclerosis (HCC) 03/14/2017  . Major depressive disorder, single episode, severe without psychosis (HCC) 01/02/2017  . Dysthymia 12/27/2016  . HTN (hypertension)   . Neurogenic bladder   . Labile blood pressure   . Leukocytosis   . Labile blood glucose   . Sleep disturbance   . Multiple sclerosis (HCC) 11/22/2016  . Thoracic root lesion 11/22/2016  . Neuropathic pain   . Leg weakness, bilateral 11/20/2016  . History of CVA (cerebrovascular accident)   . Uncontrolled type 1 diabetes mellitus with diabetic peripheral neuropathy (HCC)   . Back pain 10/31/2016  . Stable angina pectoris (HCC)   . Vitamin D deficiency 08/31/2014  . 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 Hyperlipidemia 10/06/2013   . Chronic diarrhea 10/19/2010  . Peripheral neuropathy 08/19/2010  . Type 1 diabetes mellitus (HCC) 07/06/2010  . Hypertension 07/06/2010  . Asthma 07/06/2010    Orientation RESPIRATION BLADDER Height & Weight     Self, Time, Situation, Place  Normal Indwelling catheter Weight: 138 lb 7.2 oz (62.8 kg) Height:  5\' 5"  (165.1 cm)  BEHAVIORAL SYMPTOMS/MOOD NEUROLOGICAL BOWEL NUTRITION STATUS      Incontinent Diet(Carb modified; Fluid consistency- thin)  AMBULATORY STATUS COMMUNICATION OF NEEDS Skin   Extensive Assist(Patients functional baseline is wheelchair. ) Verbally Normal                       Personal Care Assistance Level of Assistance  Bathing, Feeding, Dressing, Total care Bathing Assistance: Limited assistance Feeding assistance: Limited assistance Dressing Assistance: Limited assistance Total Care Assistance: Limited assistance   Functional Limitations Info  Sight, Hearing, Speech Sight Info: Adequate Hearing Info: Adequate Speech Info: Adequate    SPECIAL CARE FACTORS FREQUENCY  PT (By licensed PT), OT (By licensed OT)     PT Frequency: PT Evaluation 05/06/17. A min. of 3x a week recommended during acute inpatient stay.  OT Frequency: OT Evaluation 05/06/17. A min. of 2x a week recommended during acute inpatient stay.            Contractures Contractures Info: Not present    Additional Factors Info  Code Status, Allergies, Insulin Sliding Scale Code Status Info: Full Code Allergies Info: Penicillins, Pollen EXtract, Tape    Insulin Sliding Scale Info: 0-9 units 3x a day with meals.  Current Medications (05/10/2017):  This is the current hospital active medication list Current Facility-Administered Medications  Medication Dose Route Frequency Provider Last Rate Last Dose  . acetaminophen (TYLENOL) tablet 650 mg  650 mg Oral Q6H PRN Geralyn Corwin Ratliff, DO   650 mg at 05/10/17 0518   Or  . acetaminophen (TYLENOL) suppository 650 mg   650 mg Rectal Q6H PRN Geralyn Corwin Ratliff, DO      . amLODipine (NORVASC) tablet 10 mg  10 mg Oral Daily Hoffman, Jessica Ratliff, DO   10 mg at 05/10/17 1100  . baclofen (LIORESAL) tablet 5 mg  5 mg Oral TID Scherrie Gerlach, MD   5 mg at 05/10/17 1100  . bethanechol (URECHOLINE) tablet 50 mg  50 mg Oral TID Scherrie Gerlach, MD   50 mg at 05/09/17 2126  . collagenase (SANTYL) ointment   Topical Daily Gust Rung, DO      . enoxaparin (LOVENOX) injection 40 mg  40 mg Subcutaneous Q24H Hoffman, Jessica Ratliff, DO   40 mg at 05/09/17 1644  . HYDROmorphone (DILAUDID) injection 0.5 mg  0.5 mg Intravenous Q8H PRN Valentino Nose, MD   0.5 mg at 05/10/17 0838  . insulin aspart (novoLOG) injection 0-9 Units  0-9 Units Subcutaneous TID WC Camelia Phenes, DO   2 Units at 05/10/17 1610  . insulin aspart (novoLOG) injection 7 Units  7 Units Subcutaneous TID WC [name], MD   7 Units at 05/09/17 1644  . insulin glargine (LANTUS) injection 25 Units  25 Units Subcutaneous QHS [name], MD   25 Units at 05/09/17 2130  . iopamidol (ISOVUE-300) 61 % injection           . lactose free nutrition (BOOST PLUS) liquid 237 mL  237 mL Oral TID WC Nyra Market, MD   237 mL at 05/09/17 0753  . lisinopril (PRINIVIL,ZESTRIL) tablet 20 mg  20 mg Oral Daily Hoffman, Jessica Ratliff, DO   20 mg at 05/10/17 1059  . metoprolol succinate (TOPROL-XL) 24 hr tablet 100 mg  100 mg Oral Daily Geralyn Corwin Ratliff, DO   100 mg at 05/10/17 1059  . oxyCODONE (Oxy IR/ROXICODONE) immediate release tablet 5 mg  5 mg Oral Q8H PRN Geralyn Corwin Ratliff, DO   5 mg at 05/10/17 0522  . risperiDONE (RISPERDAL) tablet 1 mg  1 mg Oral QHS Scherrie Gerlach, MD   1 mg at 05/09/17 2126  . tamsulosin (FLOMAX) capsule 0.4 mg  0.4 mg Oral Daily Scherrie Gerlach, MD   0.4 mg at 05/10/17 1100  . Vitamin D (Ergocalciferol) (DRISDOL) capsule 50,000 Units  50,000 Units Oral Q7 days Scherrie Gerlach, MD    50,000 Units at 05/05/17 0905     Discharge Medications: Please see discharge summary for a list of discharge medications.  Relevant Imaging Results:  Relevant Lab Results:   Additional Information ssn# [id number]  [facility] [place] Work [phone]

## 2017-05-10 NOTE — Clinical Social Work Note (Signed)
Clinical Social Work Assessment **Assessment completed by SW Intern Noel Journey, discussed with CSW and assessment note completed by CSW after briefing with CSW intern.**  Patient Details  Name: Debra [name] MRN: [id number] Date of Birth: 19-Jul-1965  Date of referral:  05/10/17               Reason for consult:  Discharge Planning, Facility Placement                Permission sought to share information with:    Permission granted to share information::  Yes, Verbal Permission Granted  Name::     Debra [name]  Agency::  [facility]   Relationship::  Aunt   Contact Information:  [phone]  Housing/Transportation Living arrangements for the past 2 months:  Skilled Nursing Facility Source of Information:  Patient Patient Interpreter Needed:  None Criminal Activity/Legal Involvement Pertinent to Current Situation/Hospitalization:  No - Comment as needed Significant Relationships:  Adult Children, Other Family Members Lives with:  Facility Resident Do you feel safe going back to the place where you live?  Yes Need for family participation in patient care:  No (Coment)  Care giving concerns:  Patient expressed concerns regarding the care received at current nursing facility, that included being left in waste for long periods of time and getting a wound as a result.  Social Worker assessment / plan:  CSW intern talked with patient at the bedside regarding her current facility placement. Patient was alert, oriented and receptive to talking with intern. Ms. Brooking reported that she has been at [facility] for 2 months and prior to that she lived with her ex-boyfriend. Patient expressed her displeasure regarding the care she currently receives at the facility. Ms. Cassano wants another SNF and she was encouraged to talk with facility SW for assistance in finding another skilled facility.  Employment status:  Unemployed, Disabled (Comment on whether or not currently receiving  Disability) Insurance information:  Medicaid In [place] PT Recommendations:  Skilled Nursing Facility Information / Referral to community resources:  Skilled Nursing Facility  Patient/Family's Response to care:  Patient expressed no concerns regarding her care during hospitalization.  Patient/Family's Understanding of and Emotional Response to Diagnosis, Current Treatment, and Prognosis: Patient's primary concern at this time is the level of care she receives at the skilled nursing facility.  Emotional Assessment Appearance:  Appears stated age Attitude/Demeanor/Rapport:  Engaged Affect (typically observed):  Accepting, Pleasant Orientation:  Oriented to Self, Oriented to Place, Oriented to  Time, Oriented to Situation Alcohol / Substance use:  Never Used Psych involvement (Current and /or in the community):  No (Comment)  Discharge Needs  Concerns to be addressed:  No discharge needs identified Readmission within the last 30 days:  No Current discharge risk:  None Barriers to Discharge:  No Barriers Identified   Cristobal Goldmann, LCSW 05/10/2017, 5:42 PM

## 2017-05-10 NOTE — Progress Notes (Signed)
[facility]  Date of Admission:  05/05/2017             ASSESSMENT/PLAN    Fevers - Continues to have fevers with fever curve slowly decreasing, however continues to have WBC elevation. No clear source. Question infection versus inflammatory from wound as previously noted. No antibiotics currently. May need to consider additional blood cultures or possible imaging of the sacrum to rule out osteomyelitis. Will await results of abdominal CT and TTE.   Sacral Ulcer - Continue with hydrotherapy and wound care.    Principal Problem:   Bacteremia due to vancomycin resistant Enterococcus Active Problems:   Type 1 diabetes mellitus (HCC)   Multiple sclerosis (HCC)   Neurogenic bladder   Pressure injury of skin   Hyperglycemia   Bacteriuria   Proteus infection   Foley catheter in place on admission   Diarrhea   . amLODipine  10 mg Oral Daily  . baclofen  5 mg Oral TID  . bethanechol  50 mg Oral TID  . collagenase   Topical Daily  . enoxaparin (LOVENOX) injection  40 mg Subcutaneous Q24H  . insulin aspart  0-9 Units Subcutaneous TID WC  . insulin aspart  7 Units Subcutaneous TID WC  . insulin glargine  25 Units Subcutaneous QHS  . lactose free nutrition  237 mL Oral TID WC  . lisinopril  20 mg Oral Daily  . metoprolol succinate  100 mg Oral Daily  . risperiDONE  1 mg Oral QHS  . tamsulosin  0.4 mg Oral Daily  . Vitamin D (Ergocalciferol)  50,000 Units Oral Q7 days    SUBJECTIVE:  Mild fever of 100.7 yesterday but otherwise afebrile. WBC elevating now to 29.1 up from 19.6. Blood cultures drawn on 3/11 with no growth to date. TTE completed with results pending and CT abdomen ordered and pending. Antibiotics were recently stopped. Continues to have indwelling catheter.   She states she is not feeling very good today. Does feel cold at times. Continues to have discomfort in her sacral wound. Denies fevers, nausea, vomiting or diarrhea.    Allergies    Allergen Reactions  . Penicillins Anaphylaxis, Nausea And Vomiting and Rash    Has patient had a PCN reaction causing immediate rash, facial/tongue/throat swelling, SOB or lightheadedness with hypotension: Yes Has patient had a PCN reaction causing severe rash involving mucus membranes or skin necrosis: Yes Has patient had a PCN reaction that required hospitalization No Has patient had a PCN reaction occurring within the last 10 years: Yes If all of the above answers are "NO", then may proceed with Cephalosporin use.   . Pollen Extract Other (See Comments)    Seasonal Allergies  . Tape Rash     Review of Systems: Review of Systems  Constitutional: Positive for chills and malaise/fatigue. Negative for fever.  Respiratory: Negative for cough, sputum production, shortness of breath and wheezing.   Cardiovascular: Negative for chest pain.  Gastrointestinal: Negative for abdominal pain, constipation, diarrhea, nausea and vomiting.  Skin: Negative for rash.  Neurological: Negative for weakness and headaches.      OBJECTIVE: Vitals:   05/09/17 1937 05/09/17 2045 05/10/17 0508 05/10/17 1000  BP: (!) 121/58  (!) 128/52 (!) 127/50  Pulse: 84  81 76  Resp: 18  20 18   Temp: (!) 100.7 F (38.2 C) 98.4 F (36.9 C) 98.8 F (37.1 C) 98.5 F (36.9 C)  TempSrc: Oral Oral Oral Axillary  SpO2: 100%  99%  99%  Weight:      Height:       Body mass index is 23.04 kg/m.  Physical Exam  Constitutional: No distress.  Lying in bed sleeping upon entering. Awakened to voice.   Cardiovascular: Normal rate, regular rhythm and intact distal pulses. Exam reveals no gallop and no friction rub.  Murmur heard. Pulmonary/Chest: Effort normal and breath sounds normal. No respiratory distress. She has no wheezes. She has no rales. She exhibits no tenderness.  Abdominal: Soft. Bowel sounds are normal.  Skin: Skin is warm and dry.  Sacral wound dressing clean, dry and intact.   Psychiatric:  Flat  affect    Lab Results Lab Results  Component Value Date   WBC 29.1 (H) 05/10/2017   HGB 8.7 (L) 05/10/2017   HCT 27.5 (L) 05/10/2017   MCV 87.6 05/10/2017   PLT 326 05/10/2017    Lab Results  Component Value Date   CREATININE 1.50 (H) 05/10/2017   BUN 15 05/10/2017   NA 129 (L) 05/10/2017   K 4.2 05/10/2017   CL 99 (L) 05/10/2017   CO2 18 (L) 05/10/2017    Lab Results  Component Value Date   ALT 12 (L) 05/05/2017   AST 14 (L) 05/05/2017   ALKPHOS 92 05/05/2017   BILITOT 0.4 05/05/2017     Microbiology: Recent Results (from the past 240 hour(s))  Culture, blood (Routine X 2) w Reflex to ID Panel     Status: Abnormal   Collection Time: 05/05/17  2:50 AM  Result Value Ref Range Status   Specimen Description BLOOD RIGHT HAND  Final   Special Requests IN PEDIATRIC BOTTLE Blood Culture adequate volume  Final   Culture  Setup Time   Final    GRAM POSITIVE COCCI GRAM NEGATIVE RODS IN PEDIATRIC BOTTLE CRITICAL RESULT CALLED TO, READ BACK BY AND VERIFIED WITH: ESINCLAIR,PHARMD @1950  05/05/17 BY LHOWARD Performed at [facility], [address], [place], [place] [address]    Culture (A)  Final    PROTEUS MIRABILIS VANCOMYCIN RESISTANT ENTEROCOCCUS ISOLATED    Report Status 05/08/2017 FINAL  Final   Organism ID, Bacteria PROTEUS MIRABILIS  Final   Organism ID, Bacteria VANCOMYCIN RESISTANT ENTEROCOCCUS ISOLATED  Final      Susceptibility   Proteus mirabilis - MIC*    AMPICILLIN >=32 RESISTANT Resistant     CEFAZOLIN <=4 SENSITIVE Sensitive     CEFEPIME <=1 SENSITIVE Sensitive     CEFTAZIDIME <=1 SENSITIVE Sensitive     CEFTRIAXONE <=1 SENSITIVE Sensitive     CIPROFLOXACIN <=0.25 SENSITIVE Sensitive     GENTAMICIN <=1 SENSITIVE Sensitive     IMIPENEM 0.5 SENSITIVE Sensitive     TRIMETH/SULFA <=20 SENSITIVE Sensitive     AMPICILLIN/SULBACTAM 4 SENSITIVE Sensitive     PIP/TAZO <=4 SENSITIVE Sensitive     * PROTEUS MIRABILIS   Vancomycin resistant enterococcus  isolated - MIC*    AMPICILLIN <=2 SENSITIVE Sensitive     VANCOMYCIN >=32 RESISTANT Resistant     GENTAMICIN SYNERGY SENSITIVE Sensitive     LINEZOLID 2 SENSITIVE Sensitive     * VANCOMYCIN RESISTANT ENTEROCOCCUS ISOLATED  Blood Culture ID Panel (Reflexed)     Status: Abnormal   Collection Time: 05/05/17  2:50 AM  Result Value Ref Range Status   Enterococcus species DETECTED (A) NOT DETECTED Final    Comment: CRITICAL RESULT CALLED TO, READ BACK BY AND VERIFIED WITH: ESINCLAIR,PHARMD @1950  05/05/17 LHOWARD    Vancomycin resistance DETECTED (A) NOT DETECTED  Final    Comment: CRITICAL RESULT CALLED TO, READ BACK BY AND VERIFIED WITH: ESINCLAIR,PHARMD @1950  05/05/17 BY LHOWARD    Listeria monocytogenes NOT DETECTED NOT DETECTED Final   Staphylococcus species NOT DETECTED NOT DETECTED Final   Staphylococcus aureus NOT DETECTED NOT DETECTED Final   Streptococcus species NOT DETECTED NOT DETECTED Final   Streptococcus agalactiae NOT DETECTED NOT DETECTED Final   Streptococcus pneumoniae NOT DETECTED NOT DETECTED Final   Streptococcus pyogenes NOT DETECTED NOT DETECTED Final   Acinetobacter baumannii NOT DETECTED NOT DETECTED Final   Enterobacteriaceae species DETECTED (A) NOT DETECTED Final    Comment: Enterobacteriaceae represent a large family of gram-negative bacteria, not a single organism. CRITICAL RESULT CALLED TO, READ BACK BY AND VERIFIED WITH: ESINCLAIR,PHARMD @1950  05/05/17 BY LHOWARD    Enterobacter cloacae complex NOT DETECTED NOT DETECTED Final   Escherichia coli NOT DETECTED NOT DETECTED Final   Klebsiella oxytoca NOT DETECTED NOT DETECTED Final   Klebsiella pneumoniae NOT DETECTED NOT DETECTED Final   Proteus species DETECTED (A) NOT DETECTED Final    Comment: CRITICAL RESULT CALLED TO, READ BACK BY AND VERIFIED WITH: ESINCLAIR,PHARMD @1950  05/05/17 BY LHOWARD    Serratia marcescens NOT DETECTED NOT DETECTED Final   Carbapenem resistance NOT DETECTED NOT DETECTED Final     Haemophilus influenzae NOT DETECTED NOT DETECTED Final   Neisseria meningitidis NOT DETECTED NOT DETECTED Final   Pseudomonas aeruginosa NOT DETECTED NOT DETECTED Final   Candida albicans NOT DETECTED NOT DETECTED Final   Candida glabrata NOT DETECTED NOT DETECTED Final   Candida krusei NOT DETECTED NOT DETECTED Final   Candida parapsilosis NOT DETECTED NOT DETECTED Final   Candida tropicalis NOT DETECTED NOT DETECTED Final    Comment: Performed at [facility], [address], [place], [place] [address]  Culture, blood (Routine X 2) w Reflex to ID Panel     Status: None (Preliminary result)   Collection Time: 05/05/17  4:31 AM  Result Value Ref Range Status   Specimen Description BLOOD RIGHT ARM  Final   Special Requests   Final    BOTTLES DRAWN AEROBIC AND ANAEROBIC Blood Culture adequate volume   Culture   Final    NO GROWTH 4 DAYS Performed at [facility], [address], [place], [place] [address]    Report Status PENDING  Incomplete  Urine Culture     Status: Abnormal   Collection Time: 05/05/17  5:33 AM  Result Value Ref Range Status   Specimen Description URINE, CATHETERIZED  Final   Special Requests   Final    NONE Performed at [facility], [address] [place], [place] [address]    Culture MULTIPLE SPECIES PRESENT, SUGGEST RECOLLECTION (A)  Final   Report Status 05/06/2017 FINAL  Final  MRSA PCR Screening     Status: Abnormal   Collection Time: 05/05/17  6:55 AM  Result Value Ref Range Status   MRSA by PCR INVALID RESULTS, SPECIMEN SENT FOR CULTURE (A) NEGATIVE Final    Comment: RESULT CALLED TO, READ BACK BY AND VERIFIED WITH: A.RIO RN AT 1130 05/05/17 BY A.DAVIS        The GeneXpert MRSA Assay (FDA approved for NASAL specimens only), is one component of a comprehensive MRSA colonization surveillance program. It is not intended to diagnose MRSA infection nor to guide or monitor treatment for MRSA infections. Performed at [facility], [address] [place], [place] [address]   MRSA culture  Status: None   Collection Time: 05/05/17  6:55 AM  Result Value Ref Range Status   Specimen Description NASAL SWAB  Final   Special Requests NONE  Final   Culture   Final    NO MRSA DETECTED Performed at [facility], [address] [place], [place] [address]    Report Status 05/06/2017 FINAL  Final  MRSA PCR Screening     Status: None   Collection Time: 05/05/17 10:13 PM  Result Value Ref Range Status   MRSA by PCR NEGATIVE NEGATIVE Final    Comment:        The GeneXpert MRSA Assay (FDA approved for NASAL specimens only), is one component of a comprehensive MRSA colonization surveillance program. It is not intended to diagnose MRSA infection nor to guide or monitor treatment for MRSA infections. Performed at [facility], [address], [place], [place] [address]   Culture, Urine     Status: Abnormal   Collection Time: 05/06/17 12:09 PM  Result Value Ref Range Status   Specimen Description URINE, CLEAN CATCH  Final   Special Requests   Final    NONE Performed at [facility], [address], [place], [place] [address]    Culture MULTIPLE SPECIES PRESENT, SUGGEST RECOLLECTION (A)  Final   Report Status 05/07/2017 FINAL  Final  Culture, blood (routine x 2)     Status: None (Preliminary result)   Collection Time: 05/07/17  2:41 PM  Result Value Ref Range Status   Specimen Description BLOOD RIGHT ANTECUBITAL  Final   Special Requests   Final    BOTTLES DRAWN AEROBIC ONLY Blood Culture adequate volume   Culture   Final    NO GROWTH 2 DAYS Performed at [facility], [address] [place], [place] [address]    Report Status PENDING  Incomplete  Culture, blood (routine x 2)     Status: None (Preliminary result)   Collection Time: 05/07/17  2:48 PM  Result Value Ref Range Status   Specimen Description BLOOD RIGHT ANTECUBITAL  Final   Special Requests   Final    BOTTLES  DRAWN AEROBIC ONLY Blood Culture adequate volume   Culture   Final    NO GROWTH 2 DAYS Performed at [facility], [address] [place], [place] [address]    Report Status PENDING  Incomplete     Marcos Eke, NP [facility] [phone] Pager  05/10/2017  11:03 AM

## 2017-05-10 NOTE — Progress Notes (Signed)
Physical Therapy Wound Treatment Patient Details  Name: Debra [name] MRN: [id number] Date of Birth: 1965-12-07  Today's Date: 05/10/2017 Time: 1020-1055 Time Calculation (min): 35 min  Subjective  Subjective: Pt not feeling well today.  Patient and Family Stated Goals: have wound heal and pain decrease  Pain Score:  Pt premedicated prior to treatment. Pt with 7/10 pain prior to treatment. MD in room prior to therapy ordered additional pain meds.  Wound Assessment  Pressure Injury 05/05/17 Unstageable - Full thickness tissue loss in which the base of the ulcer is covered by slough (yellow, tan, gray, green or brown) and/or eschar (tan, brown or black) in the wound bed. unstageable pressure injury on sacrum,more on  (Active)  Dressing Type Gauze (Comment);Moist to dry;ABD 05/10/2017 11:00 AM  Dressing Clean;Dry;Intact 05/10/2017 11:00 AM  Dressing Change Frequency Daily 05/10/2017 11:00 AM  Site / Wound Assessment Dressing in place / Unable to assess 05/06/2017  8:00 AM  % Wound base Black/Eschar 100% 05/07/2017  1:00 PM  Wound Length (cm) 10.5 cm 05/07/2017  1:00 PM  Wound Width (cm) 4.5 cm 05/07/2017  1:00 PM  Wound Surface Area (cm^2) 47.25 cm^2 05/07/2017  1:00 PM  Drainage Amount None 05/08/2017 11:15 PM  Treatment Other (Comment) 05/05/2017  7:40 PM     Pressure Injury 05/07/17 Unstageable - Full thickness tissue loss in which the base of the ulcer is covered by slough (yellow, tan, gray, green or brown) and/or eschar (tan, brown or black) in the wound bed. Sacral Wound to R of midline (Active)  Wound Image   05/09/2017 12:00 PM  Dressing Type ABD;Moist to dry 05/10/2017 11:00 AM  Dressing Changed;Clean;Dry;Intact 05/10/2017 11:00 AM  Dressing Change Frequency Daily 05/10/2017 11:00 AM  State of Healing Eschar 05/10/2017 11:00 AM  Site / Wound Assessment Yellow 05/10/2017 11:00 AM  % Wound base Yellow/Fibrinous Exudate 100% 05/10/2017 11:00 AM  Peri-wound Assessment Intact 05/10/2017 11:00  AM  Wound Length (cm) 10.5 cm 05/09/2017  5:00 PM  Wound Width (cm) 4.5 cm 05/09/2017  5:00 PM  Wound Depth (cm) 1.5 cm 05/09/2017  5:00 PM  Wound Surface Area (cm^2) 47.25 cm^2 05/09/2017  5:00 PM  Wound Volume (cm^3) 70.88 cm^3 05/09/2017  5:00 PM  Margins Unattached edges (unapproximated) 05/10/2017 11:00 AM  Drainage Amount Minimal 05/10/2017 11:00 AM  Drainage Description Odor;Purulent 05/10/2017 11:00 AM  Treatment Cleansed;Hydrotherapy (Pulse lavage);Debridement (Selective);Packing (Saline gauze) 05/10/2017 11:00 AM  Santyl applied to wound bed prior to dressing. Hydrotherapy Pulsed lavage therapy - wound location: sacrum Pulsed Lavage with Suction (psi): 4 psi(4-12 psi) Pulsed Lavage with Suction - Normal Saline Used: 1000 mL Pulsed Lavage Tip: Tip with splash shield Selective Debridement Selective Debridement - Location: Sacrum Selective Debridement - Tools Used: Forceps;Scalpel;Scissors Selective Debridement - Tissue Removed: Continue to remove large quantites of slough, still unable to visualize wound bed   Wound Assessment and Plan  Wound Therapy - Assess/Plan/Recommendations Wound Therapy - Clinical Statement: [place] over wound continues to break down. Unable to remove all of the initial top layer of slough. Will continue to debride to wound bed. Pt very painful today and unable to perform too much debridement. MD in room during treatment and approved additional pain medication.  Factors Delaying/Impairing Wound Healing: Diabetes Mellitus;Multiple medical problems;Immobility Hydrotherapy Plan: Debridement;Dressing change;Pulsatile lavage with suction;Patient/family education Wound Therapy - Frequency: 6X / week Wound Therapy - Follow Up Recommendations: Skilled nursing facility Wound Plan: see above  Wound Therapy Goals- Improve the function of patient's integumentary system by  progressing the wound(s) through the phases of wound healing (inflammation - proliferation - remodeling)  by: Decrease Necrotic Tissue to: 20 Decrease Necrotic Tissue - Progress: Not met Increase Granulation Tissue to: 80 Increase Granulation Tissue - Progress: Not met Goals/treatment plan/discharge plan were made with and agreed upon by patient/family: Yes Time For Goal Achievement: 7 days Wound Therapy - Potential for Goals: Fair  Goals will be updated until maximal potential achieved or discharge criteria met.  Discharge criteria: when goals achieved, discharge from hospital, MD decision/surgical intervention, no progress towards goals, refusal/missing three consecutive treatments without notification or medical reason.  GP    [name]. [name], DPT Acute Rehabilitation  [phone] Pager [phone]   [place] 05/10/2017, 11:06 AM

## 2017-05-10 NOTE — Progress Notes (Signed)
Subjective:  Febrile overnight, Tmax 100.7. Otherwise hemodynamically stable. She continues to complain of pain at sacral ulcer site that is relieved by IV pain medication. She did not voice any other complaints. Discussed with patient leukocytosis continues to increase without clear cause and will obtain CT abdomen/pelvis for further evaluation. Patient verbalized understanding and is in agreement with plan. All questions answered.   Objective:  Vital signs in last 24 hours: Vitals:   05/09/17 2045 05/10/17 0508 05/10/17 1000 05/10/17 1712  BP:  (!) 128/52 (!) 127/50 137/87  Pulse:  81 76 87  Resp:  20 18 18   Temp: 98.4 F (36.9 C) 98.8 F (37.1 C) 98.5 F (36.9 C) 99.7 F (37.6 C)  TempSrc: Oral Oral Axillary Oral  SpO2:  99% 99% 97%  Weight:      Height:       Physical Exam  Constitutional: She is oriented to person, place, and time. She appears well-developed and well-nourished. No distress.  Cardiovascular: Normal rate and regular rhythm. Exam reveals no gallop and no friction rub.  Murmur (II/VI systolic murmur best heard at USB) heard. Pulmonary/Chest: Effort normal. No respiratory distress.  Abdominal: Soft. Bowel sounds are normal. She exhibits no distension. There is no tenderness.  Neurological: She is alert and oriented to person, place, and time.  Skin: She is not diaphoretic.  Unstageable sacral ulcer with deep tract noted     Assessment/Plan:  Principal Problem:   Bacteremia due to vancomycin resistant Enterococcus Active Problems:   Type 1 diabetes mellitus (HCC)   Multiple sclerosis (HCC)   Neurogenic bladder   Pressure injury of skin   Hyperglycemia   Bacteriuria   Proteus infection   Foley catheter in place on admission  # VRE and Proteus Bacteremia:Leukocytosis worsening, WBC29.1 today. Hgb stable at 8.7 and platelets are within normal range. She had a low grade fever overnight, 100.7, but is otherwise hemodynamically stable. Etiology of  leukocytosis remains unclear. Her only complaint is lower back pain at sacral ulcer site. CT abdomen/pelvis with contrast ordered for further evaluation due to concern for intra-abdominal abscess and osteomyelitis, but it was negative. TTE negative for vegetations. POC renal US unremarkable. She completed 5 days of antibiotic therapy (Daptomycin, Rocephin->Keflex) and is now off antibiotics per ID recommendations. BCx from 3/11 remain negative to date. Low suspicion for MS flare at this time as she does not report new neurological deficits. Also, it does not appear MS flare is associated with leukocytosis on review of literature. Will continue to monitor her off of antibiotics.  - ID following, appreciate recommendations  - DiscontinueDaptomycin and Keflex - CBC with diff in AM  - Tylenol 650 mg PRN for fever - Will hold on adding a carbapenem to cover for ESBL Klebsiellaper ID recsas she is at increased risk of C diff.  # Oliguria: UOP 1L yesterday. Will continue to monitor in the setting of uptrending Creatinine, 1.2-->1.3-->1.5.  - BMP in AM   #Bacteriuria:Admission urine sample was collected from already indwelling foley and analysis may represent colonization of foley itself. Repeat UA post foley exchange with pyuria, LE, and rare bacteria. Unfortunately, unable to obtain Ucx from this urine sample as it was not sent on appropriate urine cup. RepeatUcx from 3/10 with multiple species.  #Hyperglycemia #T1DM with evidence of insulin resistance CBGs at goal 115-170. Will continue current regimen.  - Lantus 25 QHS -Novolog 7UTID with meals -Continue SSI and CBG monitoring  # Unstageable pressure ulcer:Wound care recommended hydrotherapy M-Sat  with Santyl dressings after therapy and air replacement mattress -Hydrotherapy M-Sat - IV Dilaudid 0.5 mg PRN ordered to give prior to hydrotherapy  - Santyl dressings - Air mattress    #Multiple sclerosis: Patient with h/o  aggressive MS w/o signs of current flare.She has an indwelling catheter secondary to neurogenic bladder.She issupposed to be on Rituxin therapy for maintenance, however this does not appear to have been done.  -Continue muscle relaxants - PT/OT consults for continued rehabilitation   Dispo: Anticipated discharge in approximately 3-5 day(s).   [name], MD 05/10/2017, 7:42 PM Pager: [phone]

## 2017-05-11 DIAGNOSIS — N179 Acute kidney failure, unspecified: Secondary | ICD-10-CM

## 2017-05-11 LAB — CBC WITH DIFFERENTIAL/PLATELET
BASOS PCT: 0 %
Basophils Absolute: 0 10*3/uL (ref 0.0–0.1)
EOS ABS: 0.3 10*3/uL (ref 0.0–0.7)
Eosinophils Relative: 1 %
HCT: 24.6 % — ABNORMAL LOW (ref 36.0–46.0)
Hemoglobin: 7.9 g/dL — ABNORMAL LOW (ref 12.0–15.0)
LYMPHS ABS: 1.7 10*3/uL (ref 0.7–4.0)
Lymphocytes Relative: 5 %
MCH: 27.9 pg (ref 26.0–34.0)
MCHC: 32.1 g/dL (ref 30.0–36.0)
MCV: 86.9 fL (ref 78.0–100.0)
MONO ABS: 2.1 10*3/uL — AB (ref 0.1–1.0)
Monocytes Relative: 6 %
NEUTROS ABS: 30.5 10*3/uL — AB (ref 1.7–7.7)
NEUTROS PCT: 88 %
PLATELETS: 349 10*3/uL (ref 150–400)
RBC: 2.83 MIL/uL — ABNORMAL LOW (ref 3.87–5.11)
RDW: 15.1 % (ref 11.5–15.5)
WBC: 34.6 10*3/uL — ABNORMAL HIGH (ref 4.0–10.5)

## 2017-05-11 LAB — BASIC METABOLIC PANEL
ANION GAP: 11 (ref 5–15)
BUN: 17 mg/dL (ref 6–20)
CALCIUM: 8.8 mg/dL — AB (ref 8.9–10.3)
CO2: 20 mmol/L — ABNORMAL LOW (ref 22–32)
CREATININE: 1.66 mg/dL — AB (ref 0.44–1.00)
Chloride: 100 mmol/L — ABNORMAL LOW (ref 101–111)
GFR calc Af Amer: 41 mL/min — ABNORMAL LOW (ref >60)
GFR, EST NON AFRICAN AMERICAN: 35 mL/min — AB (ref >60)
GLUCOSE: 133 mg/dL — AB (ref 65–99)
Potassium: 4 mmol/L (ref 3.5–5.1)
Sodium: 131 mmol/L — ABNORMAL LOW (ref 135–145)

## 2017-05-11 LAB — CK: Total CK: 189 U/L (ref 38–234)

## 2017-05-11 LAB — GLUCOSE, CAPILLARY
Glucose-Capillary: 132 mg/dL — ABNORMAL HIGH (ref 65–99)
Glucose-Capillary: 155 mg/dL — ABNORMAL HIGH (ref 65–99)
Glucose-Capillary: 222 mg/dL — ABNORMAL HIGH (ref 65–99)
Glucose-Capillary: 238 mg/dL — ABNORMAL HIGH (ref 65–99)

## 2017-05-11 MED ORDER — SODIUM CHLORIDE 0.9 % IV BOLUS (SEPSIS)
500.0000 mL | Freq: Once | INTRAVENOUS | Status: AC
  Administered 2017-05-11: 500 mL via INTRAVENOUS

## 2017-05-11 NOTE — Progress Notes (Signed)
Physical Therapy Wound Treatment Patient Details  Name: Debra [name] MRN: [id number] Date of Birth: 02-Jul-1965  Today's Date: 05/11/2017 Time: 0932-3557 Time Calculation (min): 34 min  Subjective  Subjective: Pt with minimal verbal responses Patient and Family Stated Goals: have wound heal and pain decrease  Pain Score: Pt premedicated. Pt c/o pain in BLE's with turning. Repositioned  Wound Assessment  Pressure Injury 05/07/17 Unstageable - Full thickness tissue loss in which the base of the ulcer is covered by slough (yellow, tan, gray, green or brown) and/or eschar (tan, brown or black) in the wound bed. Sacral Wound to R of midline (Active)  Dressing Type ABD;Moist to dry;Barrier Film (skin prep) 05/11/2017  9:00 AM  Dressing Changed;Clean;Dry;Intact 05/11/2017  9:00 AM  Dressing Change Frequency Daily 05/11/2017  9:00 AM  State of Healing Eschar 05/11/2017  9:00 AM  Site / Wound Assessment Yellow 05/11/2017  9:00 AM  % Wound base Red or Granulating 0% 05/11/2017  9:00 AM  % Wound base Yellow/Fibrinous Exudate 100% 05/11/2017  9:00 AM  % Wound base Black/Eschar 0% 05/11/2017  9:00 AM  % Wound base Other/Granulation Tissue (Comment) 0% 05/11/2017  9:00 AM  Peri-wound Assessment Intact 05/11/2017  9:00 AM  Wound Length (cm) 10.5 cm 05/09/2017  5:00 PM  Wound Width (cm) 4.5 cm 05/09/2017  5:00 PM  Wound Depth (cm) 1.5 cm 05/09/2017  5:00 PM  Wound Surface Area (cm^2) 47.25 cm^2 05/09/2017  5:00 PM  Wound Volume (cm^3) 70.88 cm^3 05/09/2017  5:00 PM  Margins Unattached edges (unapproximated) 05/11/2017  9:00 AM  Drainage Amount Minimal 05/11/2017  9:00 AM  Drainage Description Purulent 05/11/2017  9:00 AM  Treatment Debridement (Selective);Hydrotherapy (Pulse lavage);Packing (Saline gauze) 05/11/2017  9:00 AM  Santyl applied to wound bed prior to applying dressing.  Hydrotherapy Pulsed lavage therapy - wound location: sacrum Pulsed Lavage with Suction (psi): 8 psi Pulsed Lavage with Suction  - Normal Saline Used: 1000 mL Pulsed Lavage Tip: Tip with splash shield Selective Debridement Selective Debridement - Location: Sacrum Selective Debridement - Tools Used: Forceps;Scalpel Selective Debridement - Tissue Removed: yellow necrotic tissue   Wound Assessment and Plan  Wound Therapy - Assess/Plan/Recommendations Wound Therapy - Clinical Statement: Slow removal of yellow necrotic tissue. Wound bed continues to be covered by necrotic tissue. Factors Delaying/Impairing Wound Healing: Diabetes Mellitus;Multiple medical problems;Immobility Hydrotherapy Plan: Debridement;Dressing change;Pulsatile lavage with suction;Patient/family education Wound Therapy - Frequency: 6X / week Wound Therapy - Follow Up Recommendations: Skilled nursing facility Wound Plan: see above  Wound Therapy Goals- Improve the function of patient's integumentary system by progressing the wound(s) through the phases of wound healing (inflammation - proliferation - remodeling) by: Decrease Necrotic Tissue to: 20 Decrease Necrotic Tissue - Progress: Progressing toward goal Increase Granulation Tissue to: 80 Increase Granulation Tissue - Progress: Progressing toward goal Goals/treatment plan/discharge plan were made with and agreed upon by patient/family: Yes Time For Goal Achievement: 7 days Wound Therapy - Potential for Goals: Fair  Goals will be updated until maximal potential achieved or discharge criteria met.  Discharge criteria: when goals achieved, discharge from hospital, MD decision/surgical intervention, no progress towards goals, refusal/missing three consecutive treatments without notification or medical reason.  GP     Shary Decamp Maycok 05/11/2017, 9:57 AM Suanne Marker PT [phone]

## 2017-05-11 NOTE — Progress Notes (Signed)
Subjective:  Low grade fever overnight. VS otherwise stable. Continues to report pain at sacral ulcer site that is unchanged, but improved with IV pain medicine. Does not voice any other complaints this morning. Discussed with patient we have not been able to find a source of infection that could explain her elevated white count. Reassured her and explained we are working closely with ID and monitoring her for any changes in status. She was advise to let us know if she develops new symptoms. Patient verbalized understanding and in agreement with plan. All questions answered.   Objective:  Vital signs in last 24 hours: Vitals:   05/11/17 0131 05/11/17 0607 05/11/17 1029 05/11/17 1042  BP:  (!) 125/59 (!) 114/51 (!) 111/49  Pulse:  82 79 78  Resp:  18  16  Temp: 97.8 F (36.6 C) 99.3 F (37.4 C)  (!) 97.2 F (36.2 C)  TempSrc: Oral Oral  Oral  SpO2:  99%  100%  Weight:      Height:       Physical Exam  Constitutional: She is oriented to person, place, and time. She appears well-developed and well-nourished. No distress.  She appears uncomfortable due to pain at sacral ulcer site   Cardiovascular: Normal rate and regular rhythm. Exam reveals no gallop.  Murmur (II/VI systolic murmur best heard at USB ) heard. Pulmonary/Chest: Effort normal. No respiratory distress.  Abdominal: Soft. Bowel sounds are normal. She exhibits no distension. There is no tenderness.  Musculoskeletal: She exhibits no edema.  Neurological: She is alert and oriented to person, place, and time.    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 She also developed a low grade fever overnight.  Etiology remains unclear. Question if this could be an inflammatory response in the setting of hydrotherapy and debridement of sacral ulcer. Could also consider drug fever as fever and leukocytosis started at initiation of antibiotic therapy. At this time, no source of infection has been identified. Repeat blood cx with NGTD at 4 days. CT abd/pelvis negative and POC renal US unremarkable.  - ID following, appreciate recommendations - Will continue to monitor WBC and fever curve - Tylenol 650 mg PRN for fever  # GMW:NUUVO function continues to worsen Cr 1.6 today. UOP 860 mL yesterday. POR renal US 3/14 and CT abdomen/pelvis 3/14 unremarkable. Likely pre-renal in the setting of poor PO intake. Will give 500cc NS bolus and continue to monitor renal function and UOP.  - Continue to monitor UOP  - BMP in AM  #Bacteriuria:Admission urine sample was collected from already indwelling foley and analysis may represent colonization of foley itself. Repeat UA post foley exchange with pyuria, LE, and rare bacteria. Unfortunately, unable to obtain Ucx from this urine sample as it was not sent on appropriate urine cup. RepeatUcx from 3/10 with multiple species.  #Hyperglycemia #T1DM with evidence of insulin resistance CBGs at goal 115-170. Will continue current regimen.  - Lantus 25QHS -Novolog7UTID with meals -Continue SSI and CBG monitoring  # Unstageable pressure ulcer:Wound care recommended hydrotherapy M-Sat  with Santyl dressings after therapy and air replacement mattress -Hydrotherapy M-Sat - IV Dilaudid 0.5 mg PRN ordered to give prior to hydrotherapy  - Santyl dressings - Air mattress    #Multiple sclerosis: Patient with h/o aggressive MS w/o signs of current flare.She has an indwelling catheter secondary to neurogenic bladder.She  issupposed to be on Rituxin therapy for maintenance, however this does not appear to have been done.  -Continue muscle relaxants - PT/OT consults for continued rehabilitation   Dispo: Anticipated discharge in approximately 2-5 day(s).   [name], MD 05/11/2017, 1:19 PM Pager: [phone]

## 2017-05-11 NOTE — Progress Notes (Signed)
Physical Therapy Treatment Patient Details Name: Debra [name] MRN: [id number] DOB: 10/30/1965 Today's Date: 05/11/2017    History of Present Illness Debra [name] is a 52yo female with PMH of diabetes, MS resulting in neurogenic bladder (with chronic indwelling foley), HTN, CKD III, recurrent UTIs and depression who presents from her nursing facility with elevated blood sugar. She was recently admitted from 2/14-2/26 for sepsis secondary to Proteus bacteremia, VRE UTI, requiring intubation 2/15-2/19 and pressors, as well as C. Diff infection.    PT Comments    Patient making very slow gains in tolerance to tilting.  She has good BP's, but her LE pains continue to limit tolerance.  Addressed with nursing including them in the tilting schedule to have more opportunities for her to be on her feet.  Feel it is a good tool if used appropriately for pt to improve.  PT to follow.    Follow Up Recommendations  SNF     Equipment Recommendations  None recommended by PT    Recommendations for Other Services       Precautions / Restrictions Precautions Precautions: Fall Restrictions Weight Bearing Restrictions: No    Mobility  Bed Mobility               General bed mobility comments: Patient declined sitting EOB, even though already sitting with HOB up for breakfast.  Preferred to tilt so used tilt bed to tilt to 15 degrees BP 118/57.  Then up to 30 degrees with pt c/o L hip pain, BP 125/48; stayed up about 10 minutes at 30 degrees performing intermittent UE therex. BP 114/51 prior to return to supine per pt request.   Transfers                    Ambulation/Gait                 Stairs            Wheelchair Mobility    Modified Rankin (Stroke Patients Only)       Balance                                            Cognition Arousal/Alertness: Awake/alert Behavior During Therapy: Flat affect Overall Cognitive Status: Within  Functional Limits for tasks assessed                                        Exercises General Exercises - Upper Extremity Shoulder Flexion: AROM;Both;5 reps(while tilted) Elbow Flexion: 5 reps;Strengthening;Both(while tilted) Bar Weights/Barbell (Elbow Flexion): 1 lb    General Comments General comments (skin integrity, edema, etc.): Discussed with RN need for tilt three times a day and placed grid in her room for sign off when she it tilted with information about positioning for max tolerance.      Pertinent Vitals/Pain Faces Pain Scale: Hurts even more Pain Location: L hip with weight bearing Pain Descriptors / Indicators: Sharp Pain Intervention(s): Monitored during session;Repositioned    Home Living                      Prior Function            PT Goals (current goals can now be found in the care plan section) Progress towards  PT goals: Progressing toward goals    Frequency    Min 3X/week      PT Plan Current plan remains appropriate    Co-evaluation              AM-PAC PT "6 Clicks" Daily Activity  Outcome Measure  Difficulty turning over in bed (including adjusting bedclothes, sheets and blankets)?: Unable Difficulty moving from lying on back to sitting on the side of the bed? : Unable Difficulty sitting down on and standing up from a chair with arms (e.g., wheelchair, bedside commode, etc,.)?: Unable Help needed moving to and from a bed to chair (including a wheelchair)?: Total Help needed walking in hospital room?: Total Help needed climbing 3-5 steps with a railing? : Total 6 Click Score: 6    End of Session   Activity Tolerance: Patient limited by pain Patient left: in bed;with call bell/phone within reach;with nursing/sitter in room Nurse Communication: Other (comment)(tilting schedule) PT Visit Diagnosis: Muscle weakness (generalized) (M62.81);Adult, failure to thrive (R62.7)     Time: 1010-1033 PT Time  Calculation (min) (ACUTE ONLY): 23 min  Charges:  $Therapeutic Activity: 23-37 mins                    G CodesSheran Lawless, [place] [phone] 05/11/2017    [name] 05/11/2017, 11:35 AM

## 2017-05-11 NOTE — Progress Notes (Signed)
[facility]  Date of Admission:  05/05/2017              ASSESSMENT/PLAN  Fevers - Fever curve appears stable with increasing leukocytosis of undetermined origin although cannot rule out hydrotherapy from wound. Blood cultures are with no growth to date x 4 days which is encouraging. Will continue to monitor WBC count and fevers.    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 amLODipine  10 mg Oral Daily  . baclofen  5 mg Oral TID  . bethanechol  50 mg Oral TID  . collagenase   Topical Daily  . enoxaparin (LOVENOX) injection  40 mg Subcutaneous Q24H  . insulin aspart  0-9 Units Subcutaneous TID WC  . insulin aspart  7 Units Subcutaneous TID WC  . insulin glargine  25 Units Subcutaneous QHS  . lactose free nutrition  237 mL Oral TID WC  . lisinopril  20 mg Oral Daily  . metoprolol succinate  100 mg Oral Daily  . risperiDONE  1 mg Oral QHS  . tamsulosin  0.4 mg Oral Daily  . Vitamin D (Ergocalciferol)  50,000 Units Oral Q7 days    SUBJECTIVE:  Overnight temperature of 100.6. Not currently receiving antibiotics. Leukocytosis continues to increase. Blood cultures from 05/07/17 with no growth to date. Feeling "so-so" with no changes since yesterday.   Allergies  Allergen Reactions  . Penicillins Anaphylaxis, Nausea And Vomiting and Rash    Has patient had a PCN reaction causing immediate rash, facial/tongue/throat swelling, SOB or lightheadedness with hypotension: Yes Has patient had a PCN reaction causing severe rash involving mucus membranes or skin necrosis: Yes Has patient had a PCN reaction that required hospitalization No Has patient had a PCN reaction occurring within the last 10 years: Yes If  all of the above answers are "NO", then may proceed with Cephalosporin use.   . Pollen Extract Other (See Comments)    Seasonal Allergies  . Tape Rash     Review of Systems: Review of Systems  Constitutional: Positive for fever and malaise/fatigue. Negative for chills.  Respiratory: Negative for cough, sputum production, shortness of breath and wheezing.   Cardiovascular: Negative for chest pain and leg swelling.  Skin: Negative for rash.  Neurological: Negative for weakness.      OBJECTIVE: Vitals:   05/11/17 0131 05/11/17 0607 05/11/17 1029 05/11/17 1042  BP:  (!) 125/59 (!) 114/51 (!) 111/49  Pulse:  82 79 78  Resp:  18  16  Temp: 97.8 F (36.6 C) 99.3 F (37.4 C)  (!) 97.2 F (36.2 C)  TempSrc: Oral Oral  Oral  SpO2:  99%  100%  Weight:      Height:       Body mass index is 23.04 kg/m.  Physical Exam  Constitutional: No distress.  Cardiovascular: Normal rate, regular rhythm and intact distal pulses. Exam reveals no gallop.  Murmur heard. Pulmonary/Chest: Effort normal and breath sounds normal. No respiratory distress. She has no wheezes. She has no rales. She exhibits no tenderness.  Abdominal: Soft. Bowel sounds are normal.    Lab Results Lab Results  Component Value Date   WBC 34.6 (H) 05/11/2017   HGB  7.9 (L) 05/11/2017   HCT 24.6 (L) 05/11/2017   MCV 86.9 05/11/2017   PLT 349 05/11/2017    Lab Results  Component Value Date   CREATININE 1.66 (H) 05/11/2017   BUN 17 05/11/2017   NA 131 (L) 05/11/2017   K 4.0 05/11/2017   CL 100 (L) 05/11/2017   CO2 20 (L) 05/11/2017    Lab Results  Component Value Date   ALT 12 (L) 05/05/2017   AST 14 (L) 05/05/2017   ALKPHOS 92 05/05/2017   BILITOT 0.4 05/05/2017     Microbiology: Recent Results (from the past 240 hour(s))  Culture, blood (Routine X 2) w Reflex to ID Panel     Status: Abnormal   Collection Time: 05/05/17  2:50 AM  Result Value Ref Range Status   Specimen Description BLOOD RIGHT HAND   Final   Special Requests IN PEDIATRIC BOTTLE Blood Culture adequate volume  Final   Culture  Setup Time   Final    GRAM POSITIVE COCCI GRAM NEGATIVE RODS IN PEDIATRIC BOTTLE CRITICAL RESULT CALLED TO, READ BACK BY AND VERIFIED WITH: ESINCLAIR,PHARMD @1950  05/05/17 BY LHOWARD Performed at [facility], [address] [place], [place] [address]    Culture (A)  Final    PROTEUS MIRABILIS VANCOMYCIN RESISTANT ENTEROCOCCUS ISOLATED    Report Status 05/08/2017 FINAL  Final   Organism ID, Bacteria PROTEUS MIRABILIS  Final   Organism ID, Bacteria VANCOMYCIN RESISTANT ENTEROCOCCUS ISOLATED  Final      Susceptibility   Proteus mirabilis - MIC*    AMPICILLIN >=32 RESISTANT Resistant     CEFAZOLIN <=4 SENSITIVE Sensitive     CEFEPIME <=1 SENSITIVE Sensitive     CEFTAZIDIME <=1 SENSITIVE Sensitive     CEFTRIAXONE <=1 SENSITIVE Sensitive     CIPROFLOXACIN <=0.25 SENSITIVE Sensitive     GENTAMICIN <=1 SENSITIVE Sensitive     IMIPENEM 0.5 SENSITIVE Sensitive     TRIMETH/SULFA <=20 SENSITIVE Sensitive     AMPICILLIN/SULBACTAM 4 SENSITIVE Sensitive     PIP/TAZO <=4 SENSITIVE Sensitive     * PROTEUS MIRABILIS   Vancomycin resistant enterococcus isolated - MIC*    AMPICILLIN <=2 SENSITIVE Sensitive     VANCOMYCIN >=32 RESISTANT Resistant     GENTAMICIN SYNERGY SENSITIVE Sensitive     LINEZOLID 2 SENSITIVE Sensitive     * VANCOMYCIN RESISTANT ENTEROCOCCUS ISOLATED  Blood Culture ID Panel (Reflexed)     Status: Abnormal   Collection Time: 05/05/17  2:50 AM  Result Value Ref Range Status   Enterococcus species DETECTED (A) NOT DETECTED Final    Comment: CRITICAL RESULT CALLED TO, READ BACK BY AND VERIFIED WITH: ESINCLAIR,PHARMD @1950  05/05/17 LHOWARD    Vancomycin resistance DETECTED (A) NOT DETECTED Final    Comment: CRITICAL RESULT CALLED TO, READ BACK BY AND VERIFIED WITH: ESINCLAIR,PHARMD @1950  05/05/17 BY LHOWARD    Listeria monocytogenes NOT DETECTED NOT DETECTED Final    Staphylococcus species NOT DETECTED NOT DETECTED Final   Staphylococcus aureus NOT DETECTED NOT DETECTED Final   Streptococcus species NOT DETECTED NOT DETECTED Final   Streptococcus agalactiae NOT DETECTED NOT DETECTED Final   Streptococcus pneumoniae NOT DETECTED NOT DETECTED Final   Streptococcus pyogenes NOT DETECTED NOT DETECTED Final   Acinetobacter baumannii NOT DETECTED NOT DETECTED Final   Enterobacteriaceae species DETECTED (A) NOT DETECTED Final    Comment: Enterobacteriaceae represent a large family of gram-negative bacteria, not a single organism. CRITICAL RESULT CALLED TO, READ BACK BY AND VERIFIED WITH: ESINCLAIR,PHARMD @1950   05/05/17 BY LHOWARD    Enterobacter cloacae complex NOT DETECTED NOT DETECTED Final   Escherichia coli NOT DETECTED NOT DETECTED Final   Klebsiella oxytoca NOT DETECTED NOT DETECTED Final   Klebsiella pneumoniae NOT DETECTED NOT DETECTED Final   Proteus species DETECTED (A) NOT DETECTED Final    Comment: CRITICAL RESULT CALLED TO, READ BACK BY AND VERIFIED WITH: ESINCLAIR,PHARMD @1950  05/05/17 BY LHOWARD    Serratia marcescens NOT DETECTED NOT DETECTED Final   Carbapenem resistance NOT DETECTED NOT DETECTED Final   Haemophilus influenzae NOT DETECTED NOT DETECTED Final   Neisseria meningitidis NOT DETECTED NOT DETECTED Final   Pseudomonas aeruginosa NOT DETECTED NOT DETECTED Final   Candida albicans NOT DETECTED NOT DETECTED Final   Candida glabrata NOT DETECTED NOT DETECTED Final   Candida krusei NOT DETECTED NOT DETECTED Final   Candida parapsilosis NOT DETECTED NOT DETECTED Final   Candida tropicalis NOT DETECTED NOT DETECTED Final    Comment: Performed at [facility], [address], [place], [place] [address]  Culture, blood (Routine X 2) w Reflex to ID Panel     Status: None   Collection Time: 05/05/17  4:31 AM  Result Value Ref Range Status   Specimen Description BLOOD RIGHT ARM  Final   Special Requests   Final    BOTTLES DRAWN  AEROBIC AND ANAEROBIC Blood Culture adequate volume   Culture   Final    NO GROWTH 5 DAYS Performed at [facility], [address], [place], [place] [address]    Report Status 05/10/2017 FINAL  Final  Urine Culture     Status: Abnormal   Collection Time: 05/05/17  5:33 AM  Result Value Ref Range Status   Specimen Description URINE, CATHETERIZED  Final   Special Requests   Final    NONE Performed at [facility], [address] [place], [place] [address]    Culture MULTIPLE SPECIES PRESENT, SUGGEST RECOLLECTION (A)  Final   Report Status 05/06/2017 FINAL  Final  MRSA PCR Screening     Status: Abnormal   Collection Time: 05/05/17  6:55 AM  Result Value Ref Range Status   MRSA by PCR INVALID RESULTS, SPECIMEN SENT FOR CULTURE (A) NEGATIVE Final    Comment: RESULT CALLED TO, READ BACK BY AND VERIFIED WITH: A.RIO RN AT 1130 05/05/17 BY A.DAVIS        The GeneXpert MRSA Assay (FDA approved for NASAL specimens only), is one component of a comprehensive MRSA colonization surveillance program. It is not intended to diagnose MRSA infection nor to guide or monitor treatment for MRSA infections. Performed at [facility], [address] [place], [place] [address]   MRSA culture     Status: None   Collection Time: 05/05/17  6:55 AM  Result Value Ref Range Status   Specimen Description NASAL SWAB  Final   Special Requests NONE  Final   Culture   Final    NO MRSA DETECTED Performed at [facility], [address], [place], [place] [address]    Report Status 05/06/2017 FINAL  Final  MRSA PCR Screening     Status: None   Collection Time: 05/05/17 10:13 PM  Result Value Ref Range Status   MRSA by PCR NEGATIVE NEGATIVE Final    Comment:        The GeneXpert MRSA Assay (FDA approved for NASAL specimens only), is one component of a comprehensive MRSA colonization surveillance program. It is not  intended to diagnose MRSA infection nor to guide or monitor  treatment for MRSA infections. Performed at [facility], [address] [place], [place] [address]   Culture, Urine     Status: Abnormal   Collection Time: 05/06/17 12:09 PM  Result Value Ref Range Status   Specimen Description URINE, CLEAN CATCH  Final   Special Requests   Final    NONE Performed at [facility], [address] [place], [place] [address]    Culture MULTIPLE SPECIES PRESENT, SUGGEST RECOLLECTION (A)  Final   Report Status 05/07/2017 FINAL  Final  Culture, blood (routine x 2)     Status: None (Preliminary result)   Collection Time: 05/07/17  2:41 PM  Result Value Ref Range Status   Specimen Description BLOOD RIGHT ANTECUBITAL  Final   Special Requests   Final    BOTTLES DRAWN AEROBIC ONLY Blood Culture adequate volume   Culture   Final    NO GROWTH 3 DAYS Performed at [facility], [address] [place], [place] [address]    Report Status PENDING  Incomplete  Culture, blood (routine x 2)     Status: None (Preliminary result)   Collection Time: 05/07/17  2:48 PM  Result Value Ref Range Status   Specimen Description BLOOD RIGHT ANTECUBITAL  Final   Special Requests   Final    BOTTLES DRAWN AEROBIC ONLY Blood Culture adequate volume   Culture   Final    NO GROWTH 3 DAYS Performed at [facility], [address], [place], [place] [address]    Report Status PENDING  Incomplete     Marcos Eke, NP [facility] [phone] Pager  05/11/2017  11:16 AM

## 2017-05-12 LAB — BASIC METABOLIC PANEL
Anion gap: 10 (ref 5–15)
BUN: 22 mg/dL — ABNORMAL HIGH (ref 6–20)
CALCIUM: 8.5 mg/dL — AB (ref 8.9–10.3)
CO2: 20 mmol/L — AB (ref 22–32)
CREATININE: 1.79 mg/dL — AB (ref 0.44–1.00)
Chloride: 97 mmol/L — ABNORMAL LOW (ref 101–111)
GFR calc Af Amer: 37 mL/min — ABNORMAL LOW (ref >60)
GFR calc non Af Amer: 32 mL/min — ABNORMAL LOW (ref >60)
GLUCOSE: 257 mg/dL — AB (ref 65–99)
Potassium: 4 mmol/L (ref 3.5–5.1)
Sodium: 127 mmol/L — ABNORMAL LOW (ref 135–145)

## 2017-05-12 LAB — GLUCOSE, CAPILLARY
GLUCOSE-CAPILLARY: 240 mg/dL — AB (ref 65–99)
Glucose-Capillary: 227 mg/dL — ABNORMAL HIGH (ref 65–99)
Glucose-Capillary: 135 mg/dL — ABNORMAL HIGH (ref 65–99)
Glucose-Capillary: 88 mg/dL (ref 65–99)

## 2017-05-12 LAB — CULTURE, BLOOD (ROUTINE X 2)
CULTURE: NO GROWTH
Culture: NO GROWTH
SPECIAL REQUESTS: ADEQUATE
Special Requests: ADEQUATE

## 2017-05-12 LAB — CBC
HCT: 23.9 % — ABNORMAL LOW (ref 36.0–46.0)
Hemoglobin: 7.7 g/dL — ABNORMAL LOW (ref 12.0–15.0)
MCH: 27.7 pg (ref 26.0–34.0)
MCHC: 32.2 g/dL (ref 30.0–36.0)
MCV: 86 fL (ref 78.0–100.0)
PLATELETS: 366 10*3/uL (ref 150–400)
RBC: 2.78 MIL/uL — ABNORMAL LOW (ref 3.87–5.11)
RDW: 15.1 % (ref 11.5–15.5)
WBC: 37.3 10*3/uL — ABNORMAL HIGH (ref 4.0–10.5)

## 2017-05-12 NOTE — Progress Notes (Signed)
Patient refused tilting in bed per protocol. Patient educated on importance of process. Patient continued to refuse. Will continue to encourage.

## 2017-05-12 NOTE — Progress Notes (Signed)
   Subjective:  Patient endorses sacral pain this morning, otherwise denies fevers, chills, n/v, abd pain.  Objective:  Vital signs in last 24 hours: Vitals:   05/11/17 1640 05/11/17 2031 05/12/17 0529 05/12/17 0922  BP: (!) 122/56 (!) 115/54 (!) 124/59 (!) 119/59  Pulse: 80 77 71 73  Resp: 16 16 16    Temp: 99.6 F (37.6 C) 98.2 F (36.8 C) 98.9 F (37.2 C)   TempSrc: Oral Oral Oral   SpO2: 96% 100% 100%   Weight:      Height:       Constitutional: NAD CV: RRR, systolic murmur, no rubs or gallops Resp: CTAB, no increased work of breathing Abd: Soft, NDNT Ext: no edema, strength unchanged  Assessment/Plan:  Principal Problem:   Bacteremia due to vancomycin resistant Enterococcus Active Problems:   Type 1 diabetes mellitus (HCC)   Multiple sclerosis (HCC)   Neurogenic bladder   Pressure injury of skin   Hyperglycemia   Bacteriuria   Proteus infection   Foley catheter in place on admission  VRE and proteus bacteremia: S/p 5 day of abx therapy with daptomycin and CTX>keflex. Repeat BCx have been neg x4d so far. TTE neg for vegetations. No further fevers yesterday. --f/u BCx  Leukocytosis: No evidence of persistent bacteremia. CT ab/pel not ideal for eval for osteo of spine, however no indication of spinal involvement from her ulcer and no evidence of intra-abd infection. Her leukocytosis seems to have plataued at 37 today. Etiology could be reaction from cephalosporins or reaction from hydrotherapy on her ulcer. She has been off of abx for 2 days now. --Continue monitoring off of antibiotics --f/u AM CBC  T1DM: Uncontrolled this AM, though keeps fluctuating. --continue lantus 25 units qhs --continue novolog 7 units tid wc +SSI --if persistently elevated tomorrow, will adjust regimen  Unstageable pressure ulcer: Hydrotherapy M-Sat with Santyl dressings. IV dilaudid prn prior to hydrotherapy for pain  MS: No evidence of flare currently. Will continue  monitoring. --PT/OT for continued rehabilitation --continue muscle relaxants  Dispo: Anticipated discharge in approximately 2-3 day(s).   Nyra Market, MD 05/12/2017, 1:16 PM Pager [phone]

## 2017-05-12 NOTE — Progress Notes (Signed)
Instructed by MD to go ahead and give patient oxycodone early. MD stated that it was ok to give with IV dilaudid prior to hydrotherapy. Orders followed. Will continue to monitor.

## 2017-05-12 NOTE — Progress Notes (Signed)
Physical Therapy Wound Treatment Patient Details  Name: Debra [name] MRN: [id number] Date of Birth: [birth year]  Today's Date: 05/12/2017 Time: 1000-1039 Time Calculation (min): 39 min  Subjective  Subjective: Pt with minimal verbal responses Patient and Family Stated Goals: have wound heal and pain decrease  Pain Score: Pain Score: pt moaning on occasion, premedicated  Wound Assessment  Pressure Injury 05/07/17 Unstageable - Full thickness tissue loss in which the base of the ulcer is covered by slough (yellow, tan, gray, green or brown) and/or eschar (tan, brown or black) in the wound bed. Sacral Wound to R of midline (Active)  Wound Image   05/09/2017 12:00 PM  Dressing Type ABD;Moist to dry;Barrier Film (skin prep) 05/12/2017  1:34 PM  Dressing Changed;Clean;Dry;Intact 05/12/2017  1:34 PM  Dressing Change Frequency Daily 05/12/2017  1:34 PM  State of Healing Eschar 05/12/2017  1:34 PM  Site / Wound Assessment Yellow 05/12/2017  1:34 PM  % Wound base Red or Granulating 5% 05/12/2017  1:34 PM  % Wound base Yellow/Fibrinous Exudate 95% 05/12/2017  1:34 PM  % Wound base Black/Eschar 0% 05/12/2017  1:34 PM  % Wound base Other/Granulation Tissue (Comment) 0% 05/12/2017  1:34 PM  Peri-wound Assessment Intact 05/12/2017  1:34 PM  Wound Length (cm) 10.5 cm 05/09/2017  5:00 PM  Wound Width (cm) 4.5 cm 05/09/2017  5:00 PM  Wound Depth (cm) 1.5 cm 05/09/2017  5:00 PM  Wound Surface Area (cm^2) 47.25 cm^2 05/09/2017  5:00 PM  Wound Volume (cm^3) 70.88 cm^3 05/09/2017  5:00 PM  Margins Unattached edges (unapproximated) 05/12/2017  1:34 PM  Drainage Amount Minimal 05/12/2017  1:34 PM  Drainage Description Purulent;Serous 05/12/2017  1:34 PM  Treatment Cleansed;Debridement (Selective);Hydrotherapy (Pulse lavage);Packing (Saline gauze) 05/12/2017  1:34 PM  Santyl applied to wound bed prior to applying dressing.  Hydrotherapy Pulsed lavage therapy - wound location: sacrum Pulsed Lavage with Suction (psi):  8 psi Pulsed Lavage with Suction - Normal Saline Used: 1000 mL Pulsed Lavage Tip: Tip with splash shield Selective Debridement Selective Debridement - Location: Sacrum Selective Debridement - Tools Used: Forceps;Scalpel Selective Debridement - Tissue Removed: yellow necrotic tissue   Wound Assessment and Plan  Wound Therapy - Assess/Plan/Recommendations Wound Therapy - Clinical Statement: Slow removal of yellow necrotic tissue. Wound bed continues to be covered by necrotic tissue. Factors Delaying/Impairing Wound Healing: Diabetes Mellitus;Multiple medical problems;Immobility Hydrotherapy Plan: Debridement;Dressing change;Pulsatile lavage with suction;Patient/family education Wound Therapy - Frequency: 6X / week Wound Therapy - Follow Up Recommendations: Skilled nursing facility Wound Plan: see above  Wound Therapy Goals- Improve the function of patient's integumentary system by progressing the wound(s) through the phases of wound healing (inflammation - proliferation - remodeling) by: Decrease Necrotic Tissue to: 20 Decrease Necrotic Tissue - Progress: Progressing toward goal Increase Granulation Tissue to: 80 Increase Granulation Tissue - Progress: Progressing toward goal Goals/treatment plan/discharge plan were made with and agreed upon by patient/family: Yes Time For Goal Achievement: 7 days Wound Therapy - Potential for Goals: Fair  Goals will be updated until maximal potential achieved or discharge criteria met.  Discharge criteria: when goals achieved, discharge from hospital, MD decision/surgical intervention, no progress towards goals, refusal/missing three consecutive treatments without notification or medical reason.  GP     Tessie Fass Shyniece Scripter 05/12/2017, 1:37 PM 05/12/2017  Donnella Sham, [place] [phone]  (pager)

## 2017-05-13 LAB — CBC
HCT: 25.5 % — ABNORMAL LOW (ref 36.0–46.0)
Hemoglobin: 8.5 g/dL — ABNORMAL LOW (ref 12.0–15.0)
MCH: 28.5 pg (ref 26.0–34.0)
MCHC: 33.3 g/dL (ref 30.0–36.0)
MCV: 85.6 fL (ref 78.0–100.0)
PLATELETS: 355 10*3/uL (ref 150–400)
RBC: 2.98 MIL/uL — AB (ref 3.87–5.11)
RDW: 15.3 % (ref 11.5–15.5)
WBC: 35.7 10*3/uL — AB (ref 4.0–10.5)

## 2017-05-13 LAB — BASIC METABOLIC PANEL
Anion gap: 12 (ref 5–15)
BUN: 23 mg/dL — AB (ref 6–20)
CO2: 17 mmol/L — ABNORMAL LOW (ref 22–32)
CREATININE: 1.68 mg/dL — AB (ref 0.44–1.00)
Calcium: 8.4 mg/dL — ABNORMAL LOW (ref 8.9–10.3)
Chloride: 100 mmol/L — ABNORMAL LOW (ref 101–111)
GFR calc non Af Amer: 34 mL/min — ABNORMAL LOW (ref >60)
GFR, EST AFRICAN AMERICAN: 40 mL/min — AB (ref >60)
Glucose, Bld: 213 mg/dL — ABNORMAL HIGH (ref 65–99)
Potassium: 4.6 mmol/L (ref 3.5–5.1)
SODIUM: 129 mmol/L — AB (ref 135–145)

## 2017-05-13 LAB — GLUCOSE, CAPILLARY
GLUCOSE-CAPILLARY: 203 mg/dL — AB (ref 65–99)
GLUCOSE-CAPILLARY: 146 mg/dL — AB (ref 65–99)
GLUCOSE-CAPILLARY: 264 mg/dL — AB (ref 65–99)
GLUCOSE-CAPILLARY: 300 mg/dL — AB (ref 65–99)

## 2017-05-13 MED ORDER — ORAL CARE MOUTH RINSE
15.0000 mL | Freq: Two times a day (BID) | OROMUCOSAL | Status: DC
  Administered 2017-05-13 – 2017-05-25 (×20): 15 mL via OROMUCOSAL

## 2017-05-13 MED ORDER — HYDROMORPHONE HCL 1 MG/ML IJ SOLN
0.5000 mg | Freq: Three times a day (TID) | INTRAMUSCULAR | Status: AC | PRN
  Administered 2017-05-13 – 2017-05-14 (×2): 1 mg via INTRAVENOUS
  Filled 2017-05-13 (×2): qty 1

## 2017-05-13 NOTE — Progress Notes (Signed)
Patient has temp of 101.3,tylenol given. MD on call text paged. No new orders received.Will continue to monitor. Hailyn Zarr, Drinda Butts, Charity fundraiser

## 2017-05-13 NOTE — Progress Notes (Signed)
   Subjective:  Patient endorses significant pain in her legs this morning after attempting to reposition them; endorses sharp pain w/o cramps. No other acute complaints today.  Objective:  Vital signs in last 24 hours: Vitals:   05/12/17 1544 05/12/17 2246 05/13/17 0700 05/13/17 0921  BP: (!) 108/49 113/71 (!) 152/65 121/70  Pulse: 73 77 79 80  Resp: 16 16 17 18   Temp:  98.9 F (37.2 C) 99.1 F (37.3 C) 98.8 F (37.1 C)  TempSrc:  Oral Oral Oral  SpO2: 98% 100% 100% 98%  Weight:      Height:       Constitutional: appears uncomfortable CV: RRR, systolic murmur, no rubs or gallops Resp: CTAB, no increased work of breathing Abd: Soft, NDNT Ext: no edema  Assessment/Plan:  Principal Problem:   Bacteremia due to vancomycin resistant Enterococcus Active Problems:   Type 1 diabetes mellitus (HCC)   Multiple sclerosis (HCC)   Neurogenic bladder   Pressure injury of skin   Hyperglycemia   Bacteriuria   Proteus infection   Foley catheter in place on admission  VRE and proteus bacteremia: Resolved. S/p 5 day of abx therapy with daptomycin and CTX>keflex. Repeat BCx have been neg x5d final. TTE neg for vegetations. No further fevers since stopping antibiotics.  Leukocytosis: No evidence of persistent bacteremia. CT ab/pel not ideal for eval for osteo of spine, however no indication of spinal involvement from her ulcer and no evidence of intra-abd infection. Her leukocytosis seems to have plataued and is down trending today. Etiology could be reaction from cephalosporins or reaction from hydrotherapy on her ulcer. She has been off of abx for 3 days now w/o recurrent fever. --Continue monitoring off of antibiotics --f/u AM CBC  T1DM: Elevated fasting CBG 200-220 last two days, however yesterday had nadir to 88; noted to have not eaten much yesterday and scheduled Novolog was held. --continue lantus 25 units qhs --continue novolog 7 units tid wc +SSI  Unstageable pressure  ulcer: Hydrotherapy M-Sat with Santyl dressings. IV dilaudid prn prior to hydrotherapy for pain  MS: No evidence of flare currently. Will continue monitoring. --PT/OT for continued rehabilitation --continue muscle relaxants  Dispo: Anticipated discharge in approximately 1-2 day(s); back to SNF.   Nyra Market, MD 05/13/2017, 11:10 AM Pager [phone]

## 2017-05-14 DIAGNOSIS — Z79899 Other long term (current) drug therapy: Secondary | ICD-10-CM

## 2017-05-14 DIAGNOSIS — E109 Type 1 diabetes mellitus without complications: Secondary | ICD-10-CM

## 2017-05-14 DIAGNOSIS — R509 Fever, unspecified: Secondary | ICD-10-CM

## 2017-05-14 DIAGNOSIS — Z95828 Presence of other vascular implants and grafts: Secondary | ICD-10-CM

## 2017-05-14 DIAGNOSIS — R6884 Jaw pain: Secondary | ICD-10-CM

## 2017-05-14 DIAGNOSIS — Z1621 Resistance to vancomycin: Secondary | ICD-10-CM

## 2017-05-14 LAB — BASIC METABOLIC PANEL
ANION GAP: 11 (ref 5–15)
BUN: 21 mg/dL — ABNORMAL HIGH (ref 6–20)
CALCIUM: 8.5 mg/dL — AB (ref 8.9–10.3)
CHLORIDE: 101 mmol/L (ref 101–111)
CO2: 20 mmol/L — ABNORMAL LOW (ref 22–32)
CREATININE: 1.43 mg/dL — AB (ref 0.44–1.00)
GFR calc non Af Amer: 42 mL/min — ABNORMAL LOW (ref >60)
GFR, EST AFRICAN AMERICAN: 49 mL/min — AB (ref >60)
GLUCOSE: 241 mg/dL — AB (ref 65–99)
Potassium: 4.1 mmol/L (ref 3.5–5.1)
Sodium: 132 mmol/L — ABNORMAL LOW (ref 135–145)

## 2017-05-14 LAB — GLUCOSE, CAPILLARY
GLUCOSE-CAPILLARY: 152 mg/dL — AB (ref 65–99)
Glucose-Capillary: 296 mg/dL — ABNORMAL HIGH (ref 65–99)
Glucose-Capillary: 326 mg/dL — ABNORMAL HIGH (ref 65–99)
Glucose-Capillary: 182 mg/dL — ABNORMAL HIGH (ref 65–99)

## 2017-05-14 LAB — CBC
HCT: 24.3 % — ABNORMAL LOW (ref 36.0–46.0)
HEMOGLOBIN: 8.1 g/dL — AB (ref 12.0–15.0)
MCH: 29 pg (ref 26.0–34.0)
MCHC: 33.3 g/dL (ref 30.0–36.0)
MCV: 87.1 fL (ref 78.0–100.0)
Platelets: 381 10*3/uL (ref 150–400)
RBC: 2.79 MIL/uL — AB (ref 3.87–5.11)
RDW: 15.5 % (ref 11.5–15.5)
WBC: 25.8 10*3/uL — ABNORMAL HIGH (ref 4.0–10.5)

## 2017-05-14 MED ORDER — INSULIN GLARGINE 100 UNIT/ML ~~LOC~~ SOLN
27.0000 [IU] | Freq: Every day | SUBCUTANEOUS | Status: DC
  Administered 2017-05-14 – 2017-05-15 (×2): 27 [IU] via SUBCUTANEOUS
  Filled 2017-05-14 (×2): qty 0.27

## 2017-05-14 MED ORDER — INSULIN ASPART 100 UNIT/ML ~~LOC~~ SOLN
8.0000 [IU] | Freq: Three times a day (TID) | SUBCUTANEOUS | Status: DC
  Administered 2017-05-14 – 2017-05-15 (×4): 8 [IU] via SUBCUTANEOUS

## 2017-05-14 MED ORDER — HYDROMORPHONE HCL 1 MG/ML IJ SOLN
0.5000 mg | Freq: Three times a day (TID) | INTRAMUSCULAR | Status: DC | PRN
Start: 2017-05-14 — End: 2017-05-19
  Administered 2017-05-14: 1 mg via INTRAVENOUS
  Administered 2017-05-15 – 2017-05-16 (×4): 0.5 mg via INTRAVENOUS
  Administered 2017-05-16 – 2017-05-19 (×6): 1 mg via INTRAVENOUS
  Filled 2017-05-14 (×2): qty 1
  Filled 2017-05-14 (×2): qty 0.5
  Filled 2017-05-14 (×6): qty 1
  Filled 2017-05-14: qty 0.5

## 2017-05-14 NOTE — Progress Notes (Signed)
Physical Therapy Wound Treatment Patient Details  Name: Debra [name] MRN: [id number] Date of Birth: [birth year]  Today's Date: 05/14/2017 Time: 3300-7622 Time Calculation (min): 40 min  Subjective  Subjective: Pt with minimal verbal responses, wimpering throughout session  Patient and Family Stated Goals: have wound heal and pain decrease  Pain Score:  Pt premedicated prior to treatment. Pt with whimpering throughout session, pain based on facial expression 6/10  Wound Assessment  Pressure Injury 05/07/17 Unstageable - Full thickness tissue loss in which the base of the ulcer is covered by slough (yellow, tan, gray, green or brown) and/or eschar (tan, brown or black) in the wound bed. Sacral Wound to R of midline (Active)  Wound Image   05/09/2017 12:00 PM  Dressing Type ABD;Moist to dry;Barrier Film (skin prep) 05/14/2017  4:00 PM  Dressing Changed;Clean;Dry;Intact 05/14/2017  4:00 PM  Dressing Change Frequency Daily 05/14/2017  4:00 PM  State of Healing Eschar 05/14/2017  4:00 PM  Site / Wound Assessment Yellow 05/14/2017  4:00 PM  % Wound base Red or Granulating 5% 05/14/2017  4:00 PM  % Wound base Yellow/Fibrinous Exudate 95% 05/14/2017  4:00 PM  % Wound base Black/Eschar 0% 05/14/2017  4:00 PM  % Wound base Other/Granulation Tissue (Comment) 0% 05/14/2017  4:00 PM  Peri-wound Assessment Intact 05/14/2017  4:00 PM  Wound Length (cm) 10.5 cm 05/09/2017  5:00 PM  Wound Width (cm) 4.5 cm 05/09/2017  5:00 PM  Wound Depth (cm) 1.5 cm 05/09/2017  5:00 PM  Wound Surface Area (cm^2) 47.25 cm^2 05/09/2017  5:00 PM  Wound Volume (cm^3) 70.88 cm^3 05/09/2017  5:00 PM  Margins Unattached edges (unapproximated) 05/14/2017  4:00 PM  Drainage Amount Minimal 05/14/2017  4:00 PM  Drainage Description Purulent;Serous 05/14/2017  4:00 PM  Treatment Cleansed;Debridement (Selective);Off loading;Packing (Saline gauze) 05/13/2017 12:16 AM  Santyl applied to wound bed prior to dressing.  Hydrotherapy Pulsed lavage  therapy - wound location: sacrum Pulsed Lavage with Suction (psi): 8 psi Pulsed Lavage with Suction - Normal Saline Used: 1000 mL Pulsed Lavage Tip: Tip with splash shield Selective Debridement Selective Debridement - Location: Sacrum Selective Debridement - Tools Used: Forceps;Scalpel Selective Debridement - Tissue Removed: yellow necrotic tissue   Wound Assessment and Plan  Wound Therapy - Assess/Plan/Recommendations Wound Therapy - Clinical Statement: Minimal amount of granulation tissue visable today, distal portion of wound continues to have tightly adhered slough material requiring continued to pulsed lavage and santyl application in addition to selective debridement to remove.  Factors Delaying/Impairing Wound Healing: Diabetes Mellitus;Multiple medical problems;Immobility Hydrotherapy Plan: Debridement;Dressing change;Pulsatile lavage with suction;Patient/family education Wound Therapy - Frequency: 6X / week Wound Therapy - Follow Up Recommendations: Skilled nursing facility Wound Plan: see above  Wound Therapy Goals- Improve the function of patient's integumentary system by progressing the wound(s) through the phases of wound healing (inflammation - proliferation - remodeling) by: Decrease Necrotic Tissue to: 20 Decrease Necrotic Tissue - Progress: Progressing toward goal Increase Granulation Tissue to: 80 Increase Granulation Tissue - Progress: Progressing toward goal Goals/treatment plan/discharge plan were made with and agreed upon by patient/family: Yes Time For Goal Achievement: 7 days Wound Therapy - Potential for Goals: Fair  Goals will be updated until maximal potential achieved or discharge criteria met.  Discharge criteria: when goals achieved, discharge from hospital, MD decision/surgical intervention, no progress towards goals, refusal/missing three consecutive treatments without notification or medical reason.  GP    [name]. [name], DPT Acute  Rehabilitation  [phone] Pager [phone]  [place] 05/14/2017, 4:55 PM

## 2017-05-14 NOTE — Progress Notes (Addendum)
Physical Therapy Treatment Patient Details Name: Debra [name] MRN: [id number] DOB: Feb 20, 1966 Today's Date: 05/14/2017    History of Present Illness Debra [name] is a 52yo female with PMH of diabetes, MS resulting in neurogenic bladder (with chronic indwelling foley), HTN, CKD III, recurrent UTIs and depression who presents from her nursing facility with elevated blood sugar. She was recently admitted from 2/14-2/26 for sepsis secondary to Proteus bacteremia, VRE UTI, requiring intubation 2/15-2/19 and pressors, as well as C. Diff infection.    PT Comments    Patient slowly progressing with mobility, but was able to tilt higher this session, though poorly tolerated.  She need encouragement and choices for either EOB or using tilt on bed rather than tilt or no tilt.  She will benefit from continued skilled PT to progress tolerance to allow improved rehab at SNF level at d/c.    Follow Up Recommendations  SNF     Equipment Recommendations  None recommended by PT    Recommendations for Other Services       Precautions / Restrictions Precautions Precautions: Fall Precaution Comments: decubitus ulcer    Mobility  Bed Mobility Overal bed mobility: Needs Assistance   Rolling: Mod assist;+2 for physical assistance         General bed mobility comments: assist to reposition hips in bed for positioning for weight bearing in tilt bed.  Initially sitting with HOB at 20 degrees BP 138/69 HR 70; tilt at 30 degrees BP 109/59, HR 68; Tilt at 40 degrees BP 121/60 HR 69; tolerated poorly at 40 degrees with multiple c/o pain, knees buckling despite straps over legs.  total time for tilting procedure 10 min  Transfers                    Ambulation/Gait                 Stairs            Wheelchair Mobility    Modified Rankin (Stroke Patients Only)       Balance           Standing balance support: No upper extremity supported Standing balance-Leahy Scale:  Zero Standing balance comment: not tolerating standing with knees buckling despite straps over legs                            Cognition Arousal/Alertness: Awake/alert Behavior During Therapy: Anxious Overall Cognitive Status: Within Functional Limits for tasks assessed                                        Exercises General Exercises - Lower Extremity Heel Slides: AAROM;5 reps;Both;Supine    General Comments General comments (skin integrity, edema, etc.): Encouraged pt to participate in tilting at least two times a day as noted she did not tilt with nursing due to refusals all weekend.  Discussed pushing through symptoms to gain benefits of weight bearing to improve tolerance to upright and leg strength.       Pertinent Vitals/Pain Faces Pain Scale: Hurts whole lot Pain Location: head, jaw Pain Descriptors / Indicators: Aching;Sore Pain Intervention(s): Monitored during session;Repositioned;Heat applied    Home Living                      Prior Function  PT Goals (current goals can now be found in the care plan section) Progress towards PT goals: Progressing toward goals(slowly)    Frequency    Min 3X/week      PT Plan Current plan remains appropriate    Co-evaluation              AM-PAC PT "6 Clicks" Daily Activity  Outcome Measure  Difficulty turning over in bed (including adjusting bedclothes, sheets and blankets)?: Unable Difficulty moving from lying on back to sitting on the side of the bed? : Unable Difficulty sitting down on and standing up from a chair with arms (e.g., wheelchair, bedside commode, etc,.)?: Unable Help needed moving to and from a bed to chair (including a wheelchair)?: Total Help needed walking in hospital room?: Total Help needed climbing 3-5 steps with a railing? : Total 6 Click Score: 6    End of Session Equipment Utilized During Treatment: Oxygen Activity Tolerance: Patient  limited by pain Patient left: in bed;with call bell/phone within reach;with nursing/sitter in room   PT Visit Diagnosis: Muscle weakness (generalized) (M62.81);Adult, failure to thrive (R62.7)     Time: 2119-4174 PT Time Calculation (min) (ACUTE ONLY): 34 min  Charges:  $Therapeutic Activity: 23-37 mins                    G CodesSheran Lawless, [place] [phone] 05/14/2017    [name] 05/14/2017, 1:52 PM

## 2017-05-14 NOTE — Progress Notes (Signed)
Subjective:  Patient febrile overnight with T 101.3, otherwise hemodynamically stable.  This morning she continues to complain of pain at sacral ulcer site as well as bilateral lower extremity pain that is unchanged.  She rates it 8/10.  She also complains of bilateral jaw pain that started 2 minutes after medical team walked in the room. Denies chest pain, shortness of breath, cough, abdominal pain, and loose stools.  Discussed with patient will follow-up with ID for further recommendations, but likely discharge to SNF soon.   Objective:  Vital signs in last 24 hours: Vitals:   05/13/17 2118 05/13/17 2350 05/14/17 0445 05/14/17 0917  BP: (!) 120/50  (!) 112/53 116/64  Pulse: 79  68 74  Resp: 16  16 18   Temp: (!) 101.3 F (38.5 C) 98.6 F (37 C) 98.1 F (36.7 C) 98 F (36.7 C)  TempSrc: Oral Oral  Oral  SpO2: 95%  99% 100%  Weight: 150 lb (68 kg)     Height:       Physical Exam  Constitutional: She is oriented to person, place, and time.  Chronically ill-appearing female lying in bed in no acute distress  HENT:  Head: Normocephalic and atraumatic.  Mouth/Throat: Oropharynx is clear and moist.  Cardiovascular: Normal rate and regular rhythm.  Murmur (II/VI systolic murmur best heard at upper sternal borders) heard. Pulmonary/Chest: Effort normal. No respiratory distress.  Abdominal: Soft. Bowel sounds are normal. She exhibits no distension. There is no tenderness.  Musculoskeletal: She exhibits no edema.  Neurological: She is oriented to person, place, and time.  Skin:  Sacral wound covered with dressing which is c/d/i    Assessment/Plan:  Principal Problem:   Fever of unknown origin (FUO) Active Problems:   Type 1 diabetes mellitus (HCC)   Multiple sclerosis (HCC)   Neurogenic bladder   Pressure injury of skin   Hyperglycemia   Bacteriuria   Bacteremia due to vancomycin resistant Enterococcus   Proteus infection   Foley catheter in place on admission  # Fever  of unknown origin # Leukocytosis Patient afebrile during the weekend. Developed a fever overnight with T 101.3. Leukocytosis continues to improve with WBC 25.8 today. Infectious workup thus far negative. No evidence of persistent bacteremia. Repeat Bcx negative. CT ab/pel not ideal for eval for osteo of spine, however no indication of spinal involvement from her ulcer and no evidence of intra-abd infection. Will repeat urine and blood cultures per ID recommendations. - ID following, appreciate recommendations  - Continue monitoring off of antibiotics - Repeat Ucx, will exchange Foley prior to collection of urine specimen - Repeat blood cx  - Per ID, if patient becomes clinically unstable, start Augmentin and oral linezolid  # VRE and proteus bacteremia: Resolved. S/p 5 day of abx therapy with daptomycin and CTX>keflex. Repeat BCx have been neg x5d final. TTE neg for vegetations. No further fevers since stopping antibiotics.  # T1DM: Elevated fasting CBG 200-300. Small increase in both short and long acting insulin as below as patient does not have consistent PO intake.  - Continue Lantus 25-> 27 units qhs - Continue novolog 7--> 8 units tid - SSI-S + CBG monitoring   # Unstageable pressure ulcer: - Hydrotherapy M-Sat with Santyl dressings - IV dilaudid prn prior to hydrotherapy for pain  # MS: No evidence of flare currently. Will continue monitoring. - PT/OT for continued rehabilitation - Continue home muscle relaxants   Dispo: Anticipated discharge in approximately 1-3 day(s).   [name], MD 05/14/2017,  11:14 AM Pager: [phone]

## 2017-05-14 NOTE — Progress Notes (Addendum)
Patient ID: Debra [name], female   DOB: 17-Mar-1965, 52 y.o.   MRN: [id number]          [facility]  Date of Admission:  05/05/2017     ASSESSMENT: She continues to have intermittent fever following 5 days of therapy for transient enterococcal and Proteus bacteremia.  Her most recent blood cultures on 05/07/2017 were negative.  I will repeat blood and urine cultures today but continue observation off of antibiotics.  If she were to become clinically unstable suggesting worsening infection I would start oral amoxicillin clavulanate and oral linezolid pending culture results.  PLAN: 1. Continue observation off of antibiotics 2. Repeat blood and urine cultures  Principal Problem:   Fever Active Problems:   Bacteremia due to vancomycin resistant Enterococcus   Proteus infection   Type 1 diabetes mellitus (HCC)   Multiple sclerosis (HCC)   Neurogenic bladder   Pressure injury of skin   Hyperglycemia   Bacteriuria   Foley catheter in place on admission   Scheduled Meds: . amLODipine  10 mg Oral Daily  . baclofen  5 mg Oral TID  . bethanechol  50 mg Oral TID  . collagenase   Topical Daily  . enoxaparin (LOVENOX) injection  40 mg Subcutaneous Q24H  . insulin aspart  0-9 Units Subcutaneous TID WC  . insulin aspart  7 Units Subcutaneous TID WC  . insulin glargine  25 Units Subcutaneous QHS  . lactose free nutrition  237 mL Oral TID WC  . lisinopril  20 mg Oral Daily  . mouth rinse  15 mL Mouth Rinse BID  . metoprolol succinate  100 mg Oral Daily  . risperiDONE  1 mg Oral QHS  . tamsulosin  0.4 mg Oral Daily  . Vitamin D (Ergocalciferol)  50,000 Units Oral Q7 days   Continuous Infusions: PRN Meds:.acetaminophen **OR** acetaminophen, HYDROmorphone (DILAUDID) injection, oxyCODONE   SUBJECTIVE: She tells me that she is having a "horrible day" because of pain from her sacral wound.  Review of Systems: Review of Systems  Constitutional: Positive  for fever. Negative for chills and diaphoresis.  Respiratory: Negative for cough, sputum production and shortness of breath.   Gastrointestinal: Negative for abdominal pain, diarrhea, nausea and vomiting.    Allergies  Allergen Reactions  . Penicillins Anaphylaxis, Nausea And Vomiting and Rash    Has patient had a PCN reaction causing immediate rash, facial/tongue/throat swelling, SOB or lightheadedness with hypotension: Yes Has patient had a PCN reaction causing severe rash involving mucus membranes or skin necrosis: Yes Has patient had a PCN reaction that required hospitalization No Has patient had a PCN reaction occurring within the last 10 years: Yes If all of the above answers are "NO", then may proceed with Cephalosporin use.   . Pollen Extract Other (See Comments)    Seasonal Allergies  . Tape Rash    OBJECTIVE: Vitals:   05/13/17 2118 05/13/17 2350 05/14/17 0445 05/14/17 0917  BP: (!) 120/50  (!) 112/53 116/64  Pulse: 79  68 74  Resp: 16  16 18   Temp: (!) 101.3 F (38.5 C) 98.6 F (37 C) 98.1 F (36.7 C) 98 F (36.7 C)  TempSrc: Oral Oral  Oral  SpO2: 95%  99% 100%  Weight: 150 lb (68 kg)     Height:       Body mass index is 24.96 kg/m.  Physical Exam  Constitutional: She is oriented to person, place, and time.  She is resting quietly in  bed.  Cardiovascular: Normal rate and regular rhythm.  No murmur heard. Pulmonary/Chest: Effort normal. She has no wheezes. She has no rales.  Abdominal: Soft. She exhibits no distension. There is no tenderness.  Musculoskeletal:  Sacral wound not examined.  Wound picture and description noted in physical therapy note on 05/12/2017.  Neurological: She is alert and oriented to person, place, and time.  Skin: No rash noted.  IV site looks good.  Psychiatric: Mood and affect normal.    Lab Results Lab Results  Component Value Date   WBC 25.8 (H) 05/14/2017   HGB 8.1 (L) 05/14/2017   HCT 24.3 (L) 05/14/2017   MCV 87.1  05/14/2017   PLT 381 05/14/2017    Lab Results  Component Value Date   CREATININE 1.43 (H) 05/14/2017   BUN 21 (H) 05/14/2017   NA 132 (L) 05/14/2017   K 4.1 05/14/2017   CL 101 05/14/2017   CO2 20 (L) 05/14/2017    Lab Results  Component Value Date   ALT 12 (L) 05/05/2017   AST 14 (L) 05/05/2017   ALKPHOS 92 05/05/2017   BILITOT 0.4 05/05/2017     Microbiology: Recent Results (from the past 240 hour(s))  Culture, blood (Routine X 2) w Reflex to ID Panel     Status: Abnormal   Collection Time: 05/05/17  2:50 AM  Result Value Ref Range Status   Specimen Description BLOOD RIGHT HAND  Final   Special Requests IN PEDIATRIC BOTTLE Blood Culture adequate volume  Final   Culture  Setup Time   Final    GRAM POSITIVE COCCI GRAM NEGATIVE RODS IN PEDIATRIC BOTTLE CRITICAL RESULT CALLED TO, READ BACK BY AND VERIFIED WITH: ESINCLAIR,PHARMD @1950  05/05/17 BY LHOWARD Performed at [facility], [address] [place], [place] [address]    Culture (A)  Final    PROTEUS MIRABILIS VANCOMYCIN RESISTANT ENTEROCOCCUS ISOLATED    Report Status 05/08/2017 FINAL  Final   Organism ID, Bacteria PROTEUS MIRABILIS  Final   Organism ID, Bacteria VANCOMYCIN RESISTANT ENTEROCOCCUS ISOLATED  Final      Susceptibility   Proteus mirabilis - MIC*    AMPICILLIN >=32 RESISTANT Resistant     CEFAZOLIN <=4 SENSITIVE Sensitive     CEFEPIME <=1 SENSITIVE Sensitive     CEFTAZIDIME <=1 SENSITIVE Sensitive     CEFTRIAXONE <=1 SENSITIVE Sensitive     CIPROFLOXACIN <=0.25 SENSITIVE Sensitive     GENTAMICIN <=1 SENSITIVE Sensitive     IMIPENEM 0.5 SENSITIVE Sensitive     TRIMETH/SULFA <=20 SENSITIVE Sensitive     AMPICILLIN/SULBACTAM 4 SENSITIVE Sensitive     PIP/TAZO <=4 SENSITIVE Sensitive     * PROTEUS MIRABILIS   Vancomycin resistant enterococcus isolated - MIC*    AMPICILLIN <=2 SENSITIVE Sensitive     VANCOMYCIN >=32 RESISTANT Resistant     GENTAMICIN SYNERGY SENSITIVE Sensitive     LINEZOLID  2 SENSITIVE Sensitive     * VANCOMYCIN RESISTANT ENTEROCOCCUS ISOLATED  Blood Culture ID Panel (Reflexed)     Status: Abnormal   Collection Time: 05/05/17  2:50 AM  Result Value Ref Range Status   Enterococcus species DETECTED (A) NOT DETECTED Final    Comment: CRITICAL RESULT CALLED TO, READ BACK BY AND VERIFIED WITH: ESINCLAIR,PHARMD @1950  05/05/17 LHOWARD    Vancomycin resistance DETECTED (A) NOT DETECTED Final    Comment: CRITICAL RESULT CALLED TO, READ BACK BY AND VERIFIED WITH: ESINCLAIR,PHARMD @1950  05/05/17 BY LHOWARD    Listeria monocytogenes NOT DETECTED NOT DETECTED  Final   Staphylococcus species NOT DETECTED NOT DETECTED Final   Staphylococcus aureus NOT DETECTED NOT DETECTED Final   Streptococcus species NOT DETECTED NOT DETECTED Final   Streptococcus agalactiae NOT DETECTED NOT DETECTED Final   Streptococcus pneumoniae NOT DETECTED NOT DETECTED Final   Streptococcus pyogenes NOT DETECTED NOT DETECTED Final   Acinetobacter baumannii NOT DETECTED NOT DETECTED Final   Enterobacteriaceae species DETECTED (A) NOT DETECTED Final    Comment: Enterobacteriaceae represent a large family of gram-negative bacteria, not a single organism. CRITICAL RESULT CALLED TO, READ BACK BY AND VERIFIED WITH: ESINCLAIR,PHARMD @1950  05/05/17 BY LHOWARD    Enterobacter cloacae complex NOT DETECTED NOT DETECTED Final   Escherichia coli NOT DETECTED NOT DETECTED Final   Klebsiella oxytoca NOT DETECTED NOT DETECTED Final   Klebsiella pneumoniae NOT DETECTED NOT DETECTED Final   Proteus species DETECTED (A) NOT DETECTED Final    Comment: CRITICAL RESULT CALLED TO, READ BACK BY AND VERIFIED WITH: ESINCLAIR,PHARMD @1950  05/05/17 BY LHOWARD    Serratia marcescens NOT DETECTED NOT DETECTED Final   Carbapenem resistance NOT DETECTED NOT DETECTED Final   Haemophilus influenzae NOT DETECTED NOT DETECTED Final   Neisseria meningitidis NOT DETECTED NOT DETECTED Final   Pseudomonas aeruginosa NOT DETECTED  NOT DETECTED Final   Candida albicans NOT DETECTED NOT DETECTED Final   Candida glabrata NOT DETECTED NOT DETECTED Final   Candida krusei NOT DETECTED NOT DETECTED Final   Candida parapsilosis NOT DETECTED NOT DETECTED Final   Candida tropicalis NOT DETECTED NOT DETECTED Final    Comment: Performed at [facility], [address] [place], [place] [address]  Culture, blood (Routine X 2) w Reflex to ID Panel     Status: None   Collection Time: 05/05/17  4:31 AM  Result Value Ref Range Status   Specimen Description BLOOD RIGHT ARM  Final   Special Requests   Final    BOTTLES DRAWN AEROBIC AND ANAEROBIC Blood Culture adequate volume   Culture   Final    NO GROWTH 5 DAYS Performed at [facility], [address], [place], [place] [address]    Report Status 05/10/2017 FINAL  Final  Urine Culture     Status: Abnormal   Collection Time: 05/05/17  5:33 AM  Result Value Ref Range Status   Specimen Description URINE, CATHETERIZED  Final   Special Requests   Final    NONE Performed at [facility], [address] [place], [place] [address]    Culture MULTIPLE SPECIES PRESENT, SUGGEST RECOLLECTION (A)  Final   Report Status 05/06/2017 FINAL  Final  MRSA PCR Screening     Status: Abnormal   Collection Time: 05/05/17  6:55 AM  Result Value Ref Range Status   MRSA by PCR INVALID RESULTS, SPECIMEN SENT FOR CULTURE (A) NEGATIVE Final    Comment: RESULT CALLED TO, READ BACK BY AND VERIFIED WITH: A.RIO RN AT 1130 05/05/17 BY A.DAVIS        The GeneXpert MRSA Assay (FDA approved for NASAL specimens only), is one component of a comprehensive MRSA colonization surveillance program. It is not intended to diagnose MRSA infection nor to guide or monitor treatment for MRSA infections. Performed at [facility], [address], [place], [place] [address]   MRSA culture     Status: None   Collection Time: 05/05/17  6:55 AM  Result Value Ref Range Status   Specimen  Description NASAL SWAB  Final   Special Requests  NONE  Final   Culture   Final    NO MRSA DETECTED Performed at [facility], [address] [place], [place] [address]    Report Status 05/06/2017 FINAL  Final  MRSA PCR Screening     Status: None   Collection Time: 05/05/17 10:13 PM  Result Value Ref Range Status   MRSA by PCR NEGATIVE NEGATIVE Final    Comment:        The GeneXpert MRSA Assay (FDA approved for NASAL specimens only), is one component of a comprehensive MRSA colonization surveillance program. It is not intended to diagnose MRSA infection nor to guide or monitor treatment for MRSA infections. Performed at [facility], [address], [place], [place] [address]   Culture, Urine     Status: Abnormal   Collection Time: 05/06/17 12:09 PM  Result Value Ref Range Status   Specimen Description URINE, CLEAN CATCH  Final   Special Requests   Final    NONE Performed at [facility], [address], [place], [place] [address]    Culture MULTIPLE SPECIES PRESENT, SUGGEST RECOLLECTION (A)  Final   Report Status 05/07/2017 FINAL  Final  Culture, blood (routine x 2)     Status: None   Collection Time: 05/07/17  2:41 PM  Result Value Ref Range Status   Specimen Description BLOOD RIGHT ANTECUBITAL  Final   Special Requests   Final    BOTTLES DRAWN AEROBIC ONLY Blood Culture adequate volume   Culture   Final    NO GROWTH 5 DAYS Performed at [facility], [address] [place], [place] [address]    Report Status 05/12/2017 FINAL  Final  Culture, blood (routine x 2)     Status: None   Collection Time: 05/07/17  2:48 PM  Result Value Ref Range Status   Specimen Description BLOOD RIGHT ANTECUBITAL  Final   Special Requests   Final    BOTTLES DRAWN AEROBIC ONLY Blood Culture adequate volume   Culture   Final    NO GROWTH 5 DAYS Performed at [facility], [address], [place], [place] [address]    Report Status 05/12/2017 FINAL  Final      Cliffton Asters, MD [facility] [phone] pager   [phone] cell 05/14/2017, 10:44 AM

## 2017-05-15 LAB — CBC
HCT: 25.9 % — ABNORMAL LOW (ref 36.0–46.0)
Hemoglobin: 8.2 g/dL — ABNORMAL LOW (ref 12.0–15.0)
MCH: 27.6 pg (ref 26.0–34.0)
MCHC: 31.7 g/dL (ref 30.0–36.0)
MCV: 87.2 fL (ref 78.0–100.0)
PLATELETS: 428 10*3/uL — AB (ref 150–400)
RBC: 2.97 MIL/uL — AB (ref 3.87–5.11)
RDW: 15.2 % (ref 11.5–15.5)
WBC: 27.7 10*3/uL — AB (ref 4.0–10.5)

## 2017-05-15 LAB — BASIC METABOLIC PANEL
ANION GAP: 13 (ref 5–15)
BUN: 22 mg/dL — ABNORMAL HIGH (ref 6–20)
CO2: 16 mmol/L — AB (ref 22–32)
Calcium: 8.7 mg/dL — ABNORMAL LOW (ref 8.9–10.3)
Chloride: 101 mmol/L (ref 101–111)
Creatinine, Ser: 1.41 mg/dL — ABNORMAL HIGH (ref 0.44–1.00)
GFR, EST AFRICAN AMERICAN: 49 mL/min — AB (ref >60)
GFR, EST NON AFRICAN AMERICAN: 43 mL/min — AB (ref >60)
Glucose, Bld: 318 mg/dL — ABNORMAL HIGH (ref 65–99)
Potassium: 4.6 mmol/L (ref 3.5–5.1)
Sodium: 130 mmol/L — ABNORMAL LOW (ref 135–145)

## 2017-05-15 LAB — GLUCOSE, CAPILLARY
GLUCOSE-CAPILLARY: 332 mg/dL — AB (ref 65–99)
GLUCOSE-CAPILLARY: 219 mg/dL — AB (ref 65–99)
GLUCOSE-CAPILLARY: 194 mg/dL — AB (ref 65–99)
Glucose-Capillary: 318 mg/dL — ABNORMAL HIGH (ref 65–99)

## 2017-05-15 MED ORDER — INSULIN ASPART 100 UNIT/ML ~~LOC~~ SOLN
10.0000 [IU] | Freq: Three times a day (TID) | SUBCUTANEOUS | Status: DC
  Administered 2017-05-15: 10 [IU] via SUBCUTANEOUS

## 2017-05-15 MED ORDER — HYDROMORPHONE HCL 1 MG/ML IJ SOLN
0.5000 mg | Freq: Three times a day (TID) | INTRAMUSCULAR | Status: AC | PRN
  Administered 2017-05-15 – 2017-05-19 (×5): 0.5 mg via INTRAVENOUS
  Filled 2017-05-15 (×5): qty 0.5

## 2017-05-15 MED ORDER — INSULIN ASPART 100 UNIT/ML ~~LOC~~ SOLN
0.0000 [IU] | Freq: Three times a day (TID) | SUBCUTANEOUS | Status: DC
  Administered 2017-05-15: 5 [IU] via SUBCUTANEOUS

## 2017-05-15 NOTE — Clinical Social Work Note (Signed)
CSW continuing to monitor patient's progress. CSW informed during morning progression that patient medically stable for discharge today, however CSW later advised that patient not yet ready for discharge. CSW will continue to follow and facilitate discharge back to [facility] when medically stable.  Genelle Bal, MSW, LCSW Licensed Clinical Social Worker Clinical Social Work Department [employer] [phone]

## 2017-05-15 NOTE — Progress Notes (Signed)
   Subjective:  Afebrile overnight (last temp 101.3 on 3/17 at 9pm). Reports lower back pain and bilateral lower extremity pain that is unchanged form yesterday but relieved by IV pain medication. Discussed with patient WBC improving but will continue to follow up infectious workup. She voiced understanding and is in agreement with plan. All questions answered.    Objective:  Vital signs in last 24 hours: Vitals:   05/14/17 0917 05/14/17 1826 05/14/17 2145 05/15/17 0518  BP: 116/64 118/74 (!) 103/53 129/66  Pulse: 74 78 69 73  Resp: 18 18 15 15   Temp: 98 F (36.7 C) 98.6 F (37 C) 98.4 F (36.9 C) 97.8 F (36.6 C)  TempSrc: Oral Oral    SpO2: 100% 100% 100% 100%  Weight:   152 lb 1.9 oz (69 kg)   Height:       Physical Exam  Constitutional: She is oriented to person, place, and time. She appears well-developed and well-nourished. No distress.  Cardiovascular: Normal rate and regular rhythm.  Murmur (II/VI systolic ejection murmur best heard at upper sternal borders) heard. Pulmonary/Chest: Effort normal and breath sounds normal. No respiratory distress. She has no wheezes. She has no rales.  Abdominal: Soft. Bowel sounds are normal. She exhibits no distension. There is no tenderness.  Musculoskeletal: She exhibits no edema.  There is marked pain with passive motion of LLE   Neurological: She is alert and oriented to person, place, and time.  UE strength intact bilaterally. Patient unable to move lower extremities. 2/5 dorsiflexion of L foot. Absent patellar reflexes bilaterally but difficult exam as patient was laying on her side and positioning her caused pain.     Assessment/Plan:  Principal Problem:   Fever of unknown origin (FUO) Active Problems:   Type 1 diabetes mellitus (HCC)   Multiple sclerosis (HCC)   Neurogenic bladder   Pressure injury of skin   Hyperglycemia   Bacteriuria   Bacteremia due to vancomycin resistant Enterococcus   Proteus infection   Foley  catheter in place on admission  # Fever of unknown origin # Leukocytosis Patient remains stable. She was afebrile overnight.  Leukocytosis slightly worsened today with WBC 27.7. Repeat blood cultures negative at 24 hours a urine culture with>100K colonies of Klebsiella pneumoniae. As mentioned in prior notes, she had a Ucx on 3/7 (prior to admission) that grew same organism. Team spoke to Dr. Orvan Falconer who recommended continuing to monitor patient off of antibiotics and starting meropenem if she acutely decompensates.  - ID following, appreciate recommendations  - Continue monitoring off of antibiotics - Per ID, if patient becomes clinically unstable, start meropenem   # VRE and proteus bacteremia: Resolved. S/p 5 day of abx therapy with daptomycin and CTX>keflex. Repeat BCx have been neg x5d final. TTE neg for vegetations.   # T1DM: CBGs persistently in the 300s today.  - Continue Lantus 27 units qhs - Continue novolog 8 units tid - SSI-S + CBG monitoring   # Unstageable pressure ulcer: - Hydrotherapy M-Sat with Santyl dressings - IV dilaudid prn prior to hydrotherapy for pain  # MS: No evidence of flare currently. Will continue monitoring. - PT/OT for continued rehabilitation - Continue home muscle relaxants   Dispo: Anticipated discharge in approximately 1-3 day(s).   Scherrie Gerlach, MD 05/15/2017, 9:48 AM Pager: [phone]

## 2017-05-15 NOTE — Progress Notes (Signed)
Physical Therapy Wound Treatment Patient Details  Name: Debra [name] MRN: [id number] Date of Birth: December 06, 1965  Today's Date: 05/15/2017 Time: 9937-1696 Time Calculation (min): 32 min  Subjective  Subjective: Pt reports she is in pain despite administration of pain medication  Patient and Family Stated Goals: have wound heal and pain decrease  Pain Score: Pt premedicated prior to treatment, however pt reports 8/10 pain with hydrotherapy.  Wound Assessment  Pressure Injury 05/07/17 Unstageable - Full thickness tissue loss in which the base of the ulcer is covered by slough (yellow, tan, gray, green or brown) and/or eschar (tan, brown or black) in the wound bed. Sacral Wound to R of midline (Active)  Wound Image   05/09/2017 12:00 PM  Dressing Type ABD;Moist to dry;Barrier Film (skin prep) 05/15/2017  4:00 PM  Dressing Changed;Clean;Dry;Intact 05/15/2017  4:00 PM  Dressing Change Frequency Daily 05/15/2017  4:00 PM  State of Healing Early/partial granulation 05/15/2017  4:00 PM  Site / Wound Assessment Yellow 05/15/2017  4:00 PM  % Wound base Red or Granulating 10% 05/15/2017  4:00 PM  % Wound base Yellow/Fibrinous Exudate 90% 05/15/2017  4:00 PM  % Wound base Black/Eschar 0% 05/15/2017  4:00 PM  % Wound base Other/Granulation Tissue (Comment) 0% 05/15/2017  4:00 PM  Peri-wound Assessment Intact 05/15/2017  4:00 PM  Wound Length (cm) 10.5 cm 05/09/2017  5:00 PM  Wound Width (cm) 4.5 cm 05/09/2017  5:00 PM  Wound Depth (cm) 1.5 cm 05/09/2017  5:00 PM  Wound Surface Area (cm^2) 47.25 cm^2 05/09/2017  5:00 PM  Wound Volume (cm^3) 70.88 cm^3 05/09/2017  5:00 PM  Margins Unattached edges (unapproximated) 05/15/2017  4:00 PM  Drainage Amount Minimal 05/15/2017  4:00 PM  Drainage Description Purulent;Serous;Odor 05/15/2017  4:00 PM  Treatment Cleansed;Debridement (Selective);Hydrotherapy (Pulse lavage);Packing (Saline gauze) 05/15/2017  4:00 PM   Hydrotherapy Pulsed lavage therapy - wound location:  sacrum Pulsed Lavage with Suction (psi): 8 psi Pulsed Lavage with Suction - Normal Saline Used: 1000 mL Pulsed Lavage Tip: Tip with splash shield Selective Debridement Selective Debridement - Location: Sacrum Selective Debridement - Tools Used: Forceps;Scalpel;Scissors Selective Debridement - Tissue Removed: yellow necrotic tissue   Wound Assessment and Plan  Wound Therapy - Assess/Plan/Recommendations Wound Therapy - Clinical Statement: Minimal amount of granulation tissue visable today, distal portion of wound continues to have tightly adhered slough material requiring continued to pulsed lavage and santyl application in addition to selective debridement to remove.  Factors Delaying/Impairing Wound Healing: Diabetes Mellitus;Multiple medical problems;Immobility Hydrotherapy Plan: Debridement;Dressing change;Pulsatile lavage with suction;Patient/family education Wound Therapy - Frequency: 6X / week Wound Therapy - Follow Up Recommendations: Skilled nursing facility Wound Plan: see above  Wound Therapy Goals- Improve the function of patient's integumentary system by progressing the wound(s) through the phases of wound healing (inflammation - proliferation - remodeling) by: Decrease Necrotic Tissue to: 20 Decrease Necrotic Tissue - Progress: Progressing toward goal Increase Granulation Tissue to: 80 Increase Granulation Tissue - Progress: Progressing toward goal Goals/treatment plan/discharge plan were made with and agreed upon by patient/family: Yes Time For Goal Achievement: 7 days Wound Therapy - Potential for Goals: Fair  Goals will be updated until maximal potential achieved or discharge criteria met.  Discharge criteria: when goals achieved, discharge from hospital, MD decision/surgical intervention, no progress towards goals, refusal/missing three consecutive treatments without notification or medical reason.  GP    [name]. [name], DPT Acute Rehabilitation  [phone] Pager [phone]   [place] 05/15/2017,  4:43 PM

## 2017-05-15 NOTE — Progress Notes (Signed)
Patient ID: Debra [name], female   DOB: [birth year], 52 y.o.   MRN: [id number]          [facility] for Infect[facility] Disease    Date of Admission:  05/05/2017            [name] has not had any more fever in the past 24 hours and repeat blood cultures remain negative.  Repeat urine culture is growing gram-negative rods but I would hold off on treatment unless there is more definitive evidence that she has symptomatic infection.  Yesterday I recommended amoxicillin clavulanate and linezolid if she showed signs of worsening but I now recognize that she has a history of penicillin allergy.  If she were to continue to have fever and clinical findings compatible with symptomatic infection I would consider starting trimethoprim sulfamethoxazole and oral linezolid  Cliffton Asters, MD [facility] [phone] pager   [phone] cell 05/15/2017, 12:30 PM

## 2017-05-15 NOTE — Progress Notes (Signed)
Inpatient Diabetes Program Recommendations  AACE/ADA: New Consensus Statement on Inpatient Glycemic Control (2015)  Target Ranges:  Prepandial:   less than 140 mg/dL      Peak postprandial:   less than 180 mg/dL (1-2 hours)      Critically ill patients:  140 - 180 mg/dL   Lab Results  Component Value Date   GLUCAP 332 (H) 05/15/2017   HGBA1C 10.3 (H) 04/19/2017    Review of Glycemic Control Results for Debra [name], Debra [name] (MRN [id number]) as of 05/15/2017 11:29  Ref. Range 05/14/2017 16:44 05/14/2017 21:25 05/15/2017 07:48  Glucose-Capillary Latest Ref Range: 65 - 99 mg/dL 893 (H) 734 (H) 287 (H)     Inpatient Diabetes Program Recommendations:     Consider increasing Lantus to 29 Units QHS as FSBS was 332 mg/dL.  Thanks, Lujean Rave, MSN, RNC-OB Diabetes Coordinator [phone] (8a-5p)

## 2017-05-16 DIAGNOSIS — E10649 Type 1 diabetes mellitus with hypoglycemia without coma: Secondary | ICD-10-CM

## 2017-05-16 LAB — CBC
HCT: 23.4 % — ABNORMAL LOW (ref 36.0–46.0)
Hemoglobin: 7.5 g/dL — ABNORMAL LOW (ref 12.0–15.0)
MCH: 27.7 pg (ref 26.0–34.0)
MCHC: 32.1 g/dL (ref 30.0–36.0)
MCV: 86.3 fL (ref 78.0–100.0)
PLATELETS: 405 10*3/uL — AB (ref 150–400)
RBC: 2.71 MIL/uL — AB (ref 3.87–5.11)
RDW: 15 % (ref 11.5–15.5)
WBC: 27.4 10*3/uL — AB (ref 4.0–10.5)

## 2017-05-16 LAB — BASIC METABOLIC PANEL
ANION GAP: 11 (ref 5–15)
BUN: 18 mg/dL (ref 6–20)
CO2: 21 mmol/L — ABNORMAL LOW (ref 22–32)
Calcium: 8.4 mg/dL — ABNORMAL LOW (ref 8.9–10.3)
Chloride: 99 mmol/L — ABNORMAL LOW (ref 101–111)
Creatinine, Ser: 1.29 mg/dL — ABNORMAL HIGH (ref 0.44–1.00)
GFR calc Af Amer: 55 mL/min — ABNORMAL LOW (ref >60)
GFR, EST NON AFRICAN AMERICAN: 47 mL/min — AB (ref >60)
GLUCOSE: 59 mg/dL — AB (ref 65–99)
Potassium: 3.8 mmol/L (ref 3.5–5.1)
SODIUM: 131 mmol/L — AB (ref 135–145)

## 2017-05-16 LAB — GLUCOSE, CAPILLARY
Glucose-Capillary: 57 mg/dL — ABNORMAL LOW (ref 65–99)
Glucose-Capillary: 134 mg/dL — ABNORMAL HIGH (ref 65–99)
Glucose-Capillary: 115 mg/dL — ABNORMAL HIGH (ref 65–99)
Glucose-Capillary: 66 mg/dL (ref 65–99)
Glucose-Capillary: 125 mg/dL — ABNORMAL HIGH (ref 65–99)

## 2017-05-16 LAB — URINE CULTURE: Culture: >=100000 — AB

## 2017-05-16 MED ORDER — INSULIN ASPART 100 UNIT/ML ~~LOC~~ SOLN
0.0000 [IU] | Freq: Three times a day (TID) | SUBCUTANEOUS | Status: DC
  Administered 2017-05-18: 2 [IU] via SUBCUTANEOUS
  Administered 2017-05-19: 3 [IU] via SUBCUTANEOUS
  Administered 2017-05-19 – 2017-05-20 (×2): 2 [IU] via SUBCUTANEOUS
  Administered 2017-05-20 (×2): 1 [IU] via SUBCUTANEOUS

## 2017-05-16 MED ORDER — INSULIN ASPART 100 UNIT/ML ~~LOC~~ SOLN
8.0000 [IU] | Freq: Three times a day (TID) | SUBCUTANEOUS | Status: DC
  Administered 2017-05-17 – 2017-05-20 (×8): 8 [IU] via SUBCUTANEOUS

## 2017-05-16 MED ORDER — INSULIN GLARGINE 100 UNIT/ML ~~LOC~~ SOLN
27.0000 [IU] | Freq: Every day | SUBCUTANEOUS | Status: DC
  Administered 2017-05-16 – 2017-05-17 (×2): 27 [IU] via SUBCUTANEOUS
  Filled 2017-05-16 (×2): qty 0.27

## 2017-05-16 NOTE — Progress Notes (Signed)
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 She was recently admitted from 2/14-2/26 for sepsis secondary to Proteus bacteremia, VRE UTI, requiring intubation 2/15-2/19 and pressors, as well as C. Diff infection.   OT comments  Pt agreeable to therapy, but with decreased tolerance of activity due to pain. With Vital Go tilt bed, raised pt to 30-35 degrees for a total of 7 minutes. Pt with flat affect and minimally conversing this visit. Declined positioning off her sacrum despite education in benefits. RN aware pt need pain meds.  Follow Up Recommendations  SNF    Equipment Recommendations  None recommended by OT    Recommendations for Other Services      Precautions / Restrictions Precautions Precautions: Fall Precaution Comments: decubitus ulcer on sacrum Restrictions Weight Bearing Restrictions: No       Mobility Bed Mobility               General bed mobility comments: pt declined positioning on her side at end of session, educated in benefits for pressure relief  Transfers                 General transfer comment: stood x 2 minutes at 35 degrees and x 5 minutes at 30 degrees using Vital Go bed, pt limited by pain    Balance                                           ADL either performed or assessed with clinical judgement   ADL                                               Vision       Perception     Praxis      Cognition Arousal/Alertness: Awake/alert Behavior During Therapy: Flat affect Overall Cognitive Status: Within Functional Limits for tasks assessed                                  General Comments: slow to respond, depressed demeanor, minimally verbal this date        Exercises General Exercises - Upper Extremity Elbow Extension: PROM;Supine;Both(passive stretch while tilted)   Shoulder Instructions       General Comments      Pertinent Vitals/ Pain       Pain Assessment: Faces Faces Pain Scale: Hurts whole lot Pain Location: generalized Pain Descriptors / Indicators: Grimacing;Guarding Pain Intervention(s): Monitored during session;Repositioned;Patient requesting pain meds-RN notified  Home Living                                          Prior Functioning/Environment              Frequency  Min 2X/week        Progress Toward Goals  OT Goals(current goals can now be found in the care plan section)  Progress towards OT goals: Progressing toward goals  Acute Rehab OT Goals Patient Stated Goal: decrease pain OT Goal Formulation: With patient Time For Goal Achievement: 05/20/17 Potential to Achieve Goals: Fair  Plan Discharge plan remains appropriate    Co-evaluation    PT/OT/SLP Co-Evaluation/Treatment: Yes Reason for Co-Treatment: For patient/therapist safety   OT goals addressed during session: Strengthening/ROM      AM-PAC PT "6 Clicks" Daily Activity     Outcome Measure   Help from another person eating meals?: A Little Help from another person taking care of personal grooming?: A Little Help from another person toileting, which includes using toliet, bedpan, or urinal?: Total Help from another person bathing (including washing, rinsing, drying)?: A Lot Help from another person to put on and taking off regular upper body clothing?: A Lot Help from another person to put on and taking off regular lower body clothing?: Total 6 Click Score: 12    End of Session    OT Visit Diagnosis: Muscle weakness (generalized) (M62.81);Pain   Activity Tolerance Patient limited by  pain   Patient Left in bed;with call bell/phone within reach   Nurse Communication Patient requests pain meds        Time: 9675-9163 OT Time Calculation (min): 23 min  Charges: OT General Charges $OT Visit: 1 Visit OT Treatments $Therapeutic Activity: 8-22 mins  05/16/2017 Martie Round, OTR/L Pager: [phone]   Iran Planas [place] B[employer] 05/16/2017, 3:51 PM

## 2017-05-16 NOTE — Clinical Social Work Note (Signed)
CSW informed by patient's nurse that she is not ready for discharge today as her urine is growing bacteria. CSW will continue to follow patient's progress, provide SW intervention services as needed and facilitate discharge back to [facility] when medically stable.  Genelle Bal, MSW, LCSW Licensed Clinical Social Worker Clinical Social Work Department [employer] [phone]

## 2017-05-16 NOTE — Progress Notes (Signed)
Patient ID: Debra [name], female   DOB: January 28, 1966, 52 y.o.   MRN: [id number]         [facility]    Date of Admission:  05/05/2017            Ms. Oneel is clinically stable and remains afebrile.  Repeat blood cultures remain negative.  Urine culture has grown an ESBL producing Klebsiella.  The same organism was isolated at her skilled nursing facility recently.  Her admission urine culture grew multiple species.  Suspect that this represents long-term colonization.  I favor not treating it now as she does not have clear-cut signs and symptoms to suggest she has a symptomatic urinary tract infection.  If she does become more symptomatic I would treat her with meropenem.  Cliffton Asters, MD [facility] [phone] pager   [phone] cell 05/16/2017, 12:48 PM

## 2017-05-16 NOTE — Progress Notes (Signed)
Physical Therapy Treatment Patient Details Name: Debra [name] MRN: [id number] DOB: Oct 28, 1965 Today's Date: 05/16/2017    History of Present Illness [name] is a 52 y/o female with PMH of diabetes, MS resulting in neurogenic bladder (with chronic indwelling foley), HTN, CKD III, recurrent UTIs and depression who presents from her nursing facility with elevated blood sugar. She was recently admitted from 2/14-2/26 for sepsis secondary to Proteus bacteremia, VRE UTI, requiring intubation 2/15-2/19 and pressors, as well as C. Diff infection.    PT Comments    Pt with poor tolerance to therapeutic intervention this session secondary to pain. Utilized Vital Go tilt bed, which pt was able to tolerate x2 mins at 35 degrees and x5 mins at 30 degrees. Pt with reported increased pain throughout and requesting pain meds. Pt's RN was notified. Pt would continue to benefit from skilled physical therapy services at this time while admitted and after d/c to address the below listed limitations in order to improve overall safety and independence with functional mobility.   Follow Up Recommendations  SNF     Equipment Recommendations  None recommended by PT    Recommendations for Other Services       Precautions / Restrictions Precautions Precautions: Fall Precaution Comments: decubitus ulcer on sacrum Restrictions Weight Bearing Restrictions: No    Mobility  Bed Mobility               General bed mobility comments: pt declined positioning on her side at end of session, educated in benefits for pressure relief  Transfers                 General transfer comment: stood x 2 minutes at 35 degrees and x 5 minutes at 30 degrees using Vital Go bed, pt limited by pain  Ambulation/Gait                 Stairs            Wheelchair Mobility    Modified Rankin (Stroke Patients Only)       Balance                                             Cognition Arousal/Alertness: Awake/alert Behavior During Therapy: Flat affect Overall Cognitive Status: Within Functional Limits for tasks assessed                                 General Comments: slow to respond, depressed demeanor, minimally verbal this date      Exercises General Exercises - Upper Extremity Elbow Extension: PROM;Supine;Both(passive stretch while tilted)    General Comments General comments (skin integrity, edema, etc.): pt tolerated modified standing position x2 minutes at 35 degrees and x5 minutes at 30 degrees using Vital Go bed, pt limited by pain      Pertinent Vitals/Pain Pain Assessment: Faces Faces Pain Scale: Hurts whole lot Pain Location: generalized Pain Descriptors / Indicators: Grimacing;Guarding Pain Intervention(s): Monitored during session;Repositioned;Patient requesting pain meds-RN notified    Home Living                      Prior Function            PT Goals (current goals can now be found in the care plan section) Acute Rehab PT Goals  Patient Stated Goal: decrease pain PT Goal Formulation: With patient Time For Goal Achievement: 05/20/17 Potential to Achieve Goals: Fair Progress towards PT goals: Not progressing toward goals - comment(pt limited by pain)    Frequency    Min 2X/week      PT Plan Frequency needs to be updated    Co-evaluation PT/OT/SLP Co-Evaluation/Treatment: Yes Reason for Co-Treatment: For patient/therapist safety;To address functional/ADL transfers PT goals addressed during session: Mobility/safety with mobility;Balance;Proper use of DME;Strengthening/ROM OT goals addressed during session: Strengthening/ROM      AM-PAC PT "6 Clicks" Daily Activity  Outcome Measure  Difficulty turning over in bed (including adjusting bedclothes, sheets and blankets)?: Unable Difficulty moving from lying on back to sitting on the side of the bed? : Unable Difficulty sitting down on and  standing up from a chair with arms (e.g., wheelchair, bedside commode, etc,.)?: Unable Help needed moving to and from a bed to chair (including a wheelchair)?: Total Help needed walking in hospital room?: Total Help needed climbing 3-5 steps with a railing? : Total 6 Click Score: 6    End of Session   Activity Tolerance: Patient limited by pain Patient left: in bed;with call bell/phone within reach Nurse Communication: Mobility status;Patient requests pain meds PT Visit Diagnosis: Muscle weakness (generalized) (M62.81);Adult, failure to thrive (R62.7)     Time: 5790-3833 PT Time Calculation (min) (ACUTE ONLY): 23 min  Charges:  $Therapeutic Activity: 8-22 mins                    G Codes:       [place], [place], [name] [phone]    [name] Saanya Zieske 05/16/2017, 5:09 PM

## 2017-05-16 NOTE — Progress Notes (Addendum)
Inpatient Diabetes Program Recommendations  AACE/ADA: New Consensus Statement on Inpatient Glycemic Control (2015)  Target Ranges:  Prepandial:   less than 140 mg/dL      Peak postprandial:   less than 180 mg/dL (1-2 hours)      Critically ill patients:  140 - 180 mg/dL   Lab Results  Component Value Date   GLUCAP 134 (H) 05/16/2017   HGBA1C 10.3 (H) 04/19/2017    Review of Glycemic Control Results for Debra [name], Debra [name] (MRN [id number]) as of 05/16/2017 10:48  Ref. Range 05/15/2017 16:43 05/15/2017 20:37 05/16/2017 07:58 05/16/2017 08:36  Glucose-Capillary Latest Ref Range: 65 - 99 mg/dL 702 (H) 637 (H) 57 (L) 134 (H)   Diabetes history: Type 1 DM Outpatient Diabetes medications: Lantus 23 units QD, Novolog 2-10 units TIDAC Current orders for Inpatient glycemic control: Novolog 0-15 units TID, Novolog 10 units TIDAC, Lantus 27 units QHS.  Inpatient Diabetes Program Recommendations:    Noted hypoglycemic event this AM, FSBS was 57 mg/dL. Also of note, patient received 45 units of short acting insulin yesterday in a 24 hour period. This was regardless of the percentage of consumption.   If patient remains inpatient:  Please change all short acting insulin to the Glycemic control order ssi order set and specify parameters for meal coverage to prevent patient from receiving too much meal coverage.   1. Consider decreasing correction to Novolog 0-9 units TID. Moderate correction is too much for this patient, given type 1 DM carb ratio.  2. Decrease meal coverage back to 8 units TIDAC (WHEN PATIENT CONSUMES >50% of meal). This is using glycemic control order set. Text page sent.  3. Could still consider Lantus 29 units QD.   Thanks, Lujean Rave, MSN, RNC-OB Diabetes Coordinator [phone] (8a-5p)

## 2017-05-16 NOTE — Progress Notes (Signed)
Physical Therapy Wound Treatment Patient Details  Name: Debra [name] MRN: [id number] Date of Birth: [birth year]  Today's Date: 05/16/2017 Time: 9381-0175 Time Calculation (min): 47 min  Subjective  Subjective: Pt reports she is painful from rolling to be cleaned prior to hydrotherapy Patient and Family Stated Goals: have wound heal and pain decrease  Pain Score:  Pt premedicated prior to treatment but still reports 8/10 pain during treatment.   Wound Assessment  Pressure Injury 05/07/17 Unstageable - Full thickness tissue loss in which the base of the ulcer is covered by slough (yellow, tan, gray, green or brown) and/or eschar (tan, brown or black) in the wound bed. Sacral Wound to R of midline (Active)  Wound Image   05/16/2017  9:00 AM  Dressing Type ABD;Moist to dry;Barrier Film (skin prep) 05/16/2017  4:00 PM  Dressing Changed;Clean;Dry;Intact 05/16/2017  4:00 PM  Dressing Change Frequency Daily 05/16/2017  4:00 PM  State of Healing Early/partial granulation 05/16/2017  4:00 PM  Site / Wound Assessment Yellow 05/16/2017  4:00 PM  % Wound base Red or Granulating 10% 05/16/2017  4:00 PM  % Wound base Yellow/Fibrinous Exudate 90% 05/16/2017  4:00 PM  % Wound base Black/Eschar 0% 05/16/2017  4:00 PM  % Wound base Other/Granulation Tissue (Comment) 0% 05/16/2017  4:00 PM  Peri-wound Assessment Intact 05/16/2017  4:00 PM  Wound Length (cm) 10.5 cm 05/16/2017  4:00 PM  Wound Width (cm) 6 cm 05/16/2017  4:00 PM  Wound Depth (cm) 2 cm 05/16/2017  4:00 PM  Wound Surface Area (cm^2) 63 cm^2 05/16/2017  4:00 PM  Wound Volume (cm^3) 126 cm^3 05/16/2017  4:00 PM  Tunneling (cm) 3 cm 1 o'clock  05/16/2017  4:00 PM  Undermining (cm) depth of 1.5 cm from 6 - 8 o'clock , depth of 1 cm 12- 1 o'clock 05/16/2017  4:00 PM  Margins Unattached edges (unapproximated) 05/16/2017  4:00 PM  Drainage Amount Moderate 05/16/2017  4:00 PM  Drainage Description Purulent;Serous;Odor 05/16/2017  4:00 PM  Treatment  Cleansed;Debridement (Selective);Hydrotherapy (Pulse lavage);Packing (Saline gauze) 05/16/2017  4:00 PM  Santyl applied to wound bed prior to dressing.  Hydrotherapy Pulsed lavage therapy - wound location: sacrum Pulsed Lavage with Suction (psi): 8 psi Pulsed Lavage with Suction - Normal Saline Used: 1000 mL Pulsed Lavage Tip: Tip with splash shield Selective Debridement Selective Debridement - Location: Sacrum Selective Debridement - Tools Used: Forceps;Scalpel;Scissors Selective Debridement - Tissue Removed: yellow necrotic tissue   Wound Assessment and Plan  Wound Therapy - Assess/Plan/Recommendations Wound Therapy - Clinical Statement: Pt with hard shiny area to 1 o'clock of wound with probing has at leat 3 cm of tunneling, the rmained of the wound bed continues to produce yellow gray stringy slough material requiring debridement.  Factors Delaying/Impairing Wound Healing: Diabetes Mellitus;Multiple medical problems;Immobility Hydrotherapy Plan: Debridement;Dressing change;Pulsatile lavage with suction;Patient/family education Wound Therapy - Frequency: 6X / week Wound Therapy - Follow Up Recommendations: Skilled nursing facility Wound Plan: see above  Wound Therapy Goals- Improve the function of patient's integumentary system by progressing the wound(s) through the phases of wound healing (inflammation - proliferation - remodeling) by: Decrease Necrotic Tissue to: 20 Decrease Necrotic Tissue - Progress: Progressing toward goal Increase Granulation Tissue to: 80 Increase Granulation Tissue - Progress: Progressing toward goal Goals/treatment plan/discharge plan were made with and agreed upon by patient/family: Yes Time For Goal Achievement: 7 days Wound Therapy - Potential for Goals: Fair  Goals will be updated until maximal potential achieved or discharge criteria met.  Discharge  criteria: when goals achieved, discharge from hospital, MD decision/surgical intervention, no progress  towards goals, refusal/missing three consecutive treatments without notification or medical reason.  GP    [name]. [name], DPT Acute Rehabilitation  [phone] Pager [phone]   [place] 05/16/2017, 5:01 PM

## 2017-05-16 NOTE — Progress Notes (Signed)
   Subjective:  No acute events overnight. Patient continues to complain of lower back pain and bilateral lower extremity pain. Discussed with patient likely source of leukocytosis is a urine infection. Patient verbalized understanding. All questions answered.   Objective:  Vital signs in last 24 hours: Vitals:   05/15/17 1827 05/15/17 2038 05/16/17 0539 05/16/17 0908  BP: 125/68 (!) 106/55 (!) 123/57 110/60  Pulse: 74 70 77 85  Resp: 15 16 18 18   Temp: 98.3 F (36.8 C) 98 F (36.7 C)  98.2 F (36.8 C)  TempSrc: Oral Oral  Oral  SpO2: 100% 100% 100% 100%  Weight:  141 lb (64 kg)    Height:       Physical Exam  Constitutional: She is oriented to person, place, and time. She appears well-developed and well-nourished. No distress.  Cardiovascular: Normal rate and regular rhythm. Exam reveals no gallop and no friction rub.  Murmur (II/VI systolic murmur best heard at USB) heard. Pulmonary/Chest: Effort normal. No respiratory distress.  Abdominal: Soft. Bowel sounds are normal. She exhibits no distension. There is no tenderness.  Musculoskeletal: She exhibits no edema.  Neurological: She is alert and oriented to person, place, and time.    Assessment/Plan:  Active Problems:   Type 1 diabetes mellitus (HCC)   Multiple sclerosis (HCC)   Neurogenic bladder   Pressure injury of skin   Hyperglycemia   Bacteriuria   Bacteremia due to vancomycin resistant Enterococcus   Proteus infection   Foley catheter in place on admission  # Fever # Leukocytosis Patient remains stable. Afebrile for the past 2 days. She did receive Tylenol yesterday afternoon. Leukocytosis stable at 27. Ucx with>100K colonies of Klebsiella pneumoniae. Repeat Bcx remain negative. Per ID recommendations, will continue to monitor patient off of antibiotics as she is asymptomatic and starting meropenem if she acutely decompensates.  - ID following, appreciate recommendations  - Continue monitoring off of  antibiotics - Discontinue Tylenol  - Per ID, if patient becomes clinically unstable, start meropenem   # VRE and proteus bacteremia: Resolved.S/p 5 day of abx therapy with daptomycin and CTX>keflex. Repeat BCx have been neg x5d final. TTE neg for vegetations.   # T1DM: CBG in the 50s this AM. Will continue Lantus at current dose and decrease Novolog to 8 units as well as SSI-M--> sensitive.  - Continue Lantus 27 units qhs - Continue novolog 8 units tid - SSI-S + CBG monitoring   # Unstageable pressure ulcer: - Hydrotherapy M-Sat with Santyl dressings - IV dilaudid prn prior to hydrotherapy for pain  # MS: No evidence of flare currently. Will continue monitoring. - PT/OT for continued rehabilitation - Continue home muscle relaxants   Dispo: Anticipated discharge in approximately 2-3 day(s).   [name], MD 05/16/2017, 1:06 PM Pager: [phone]

## 2017-05-17 DIAGNOSIS — E10622 Type 1 diabetes mellitus with other skin ulcer: Secondary | ICD-10-CM

## 2017-05-17 DIAGNOSIS — M545 Low back pain: Secondary | ICD-10-CM

## 2017-05-17 DIAGNOSIS — M79604 Pain in right leg: Secondary | ICD-10-CM

## 2017-05-17 DIAGNOSIS — M79605 Pain in left leg: Secondary | ICD-10-CM

## 2017-05-17 LAB — BASIC METABOLIC PANEL
Anion gap: 8 (ref 5–15)
BUN: 17 mg/dL (ref 6–20)
CHLORIDE: 102 mmol/L (ref 101–111)
CO2: 21 mmol/L — ABNORMAL LOW (ref 22–32)
CREATININE: 1.19 mg/dL — AB (ref 0.44–1.00)
Calcium: 8.5 mg/dL — ABNORMAL LOW (ref 8.9–10.3)
GFR, EST NON AFRICAN AMERICAN: 52 mL/min — AB (ref >60)
Glucose, Bld: 62 mg/dL — ABNORMAL LOW (ref 65–99)
Potassium: 4.2 mmol/L (ref 3.5–5.1)
SODIUM: 131 mmol/L — AB (ref 135–145)

## 2017-05-17 LAB — CBC
HCT: 24.9 % — ABNORMAL LOW (ref 36.0–46.0)
HEMOGLOBIN: 8.1 g/dL — AB (ref 12.0–15.0)
MCH: 28.1 pg (ref 26.0–34.0)
MCHC: 32.5 g/dL (ref 30.0–36.0)
MCV: 86.5 fL (ref 78.0–100.0)
PLATELETS: 424 10*3/uL — AB (ref 150–400)
RBC: 2.88 MIL/uL — AB (ref 3.87–5.11)
RDW: 15 % (ref 11.5–15.5)
WBC: 31.7 10*3/uL — ABNORMAL HIGH (ref 4.0–10.5)

## 2017-05-17 LAB — GLUCOSE, CAPILLARY
GLUCOSE-CAPILLARY: 69 mg/dL (ref 65–99)
GLUCOSE-CAPILLARY: 103 mg/dL — AB (ref 65–99)
Glucose-Capillary: 112 mg/dL — ABNORMAL HIGH (ref 65–99)

## 2017-05-17 NOTE — Progress Notes (Signed)
Physical Therapy Wound Treatment Patient Details  Name: Debra [name] MRN: [id number] Date of Birth: Jan 31, 1966  Today's Date: 05/17/2017 Time: 7824-2353 Time Calculation (min): 52 min  Subjective  Subjective: Pt reports she is feeling better today  Patient and Family Stated Goals: have wound heal and pain decrease  Pain Score:  Pt premedicated prior to treatment. Pt continues to have increased pain with pulsed lavage and debridement.   Wound Assessment  Pressure Injury 05/07/17 Unstageable - Full thickness tissue loss in which the base of the ulcer is covered by slough (yellow, tan, gray, green or brown) and/or eschar (tan, brown or black) in the wound bed. Sacral Wound to R of midline (Active)  Wound Image   05/16/2017  9:00 AM  Dressing Type ABD;Moist to dry;Barrier Film (skin prep) 05/17/2017  1:00 PM  Dressing Changed;Clean;Dry;Intact 05/17/2017  1:00 PM  Dressing Change Frequency Daily 05/17/2017  1:00 PM  State of Healing Early/partial granulation 05/17/2017  1:00 PM  Site / Wound Assessment Yellow 05/17/2017  1:00 PM  % Wound base Red or Granulating 10% 05/17/2017  1:00 PM  % Wound base Yellow/Fibrinous Exudate 90% 05/17/2017  1:00 PM  % Wound base Black/Eschar 0% 05/17/2017  1:00 PM  % Wound base Other/Granulation Tissue (Comment) 0% 05/17/2017  1:00 PM  Peri-wound Assessment Intact 05/17/2017  1:00 PM  Wound Length (cm) 10.5 cm 05/16/2017  4:00 PM  Wound Width (cm) 6 cm 05/16/2017  4:00 PM  Wound Depth (cm) 2 cm 05/16/2017  4:00 PM  Wound Surface Area (cm^2) 63 cm^2 05/16/2017  4:00 PM  Wound Volume (cm^3) 126 cm^3 05/16/2017  4:00 PM  Tunneling (cm) 3 cm 1 o'clock  05/16/2017  4:00 PM  Undermining (cm) depth of 1.5 cm from 6 - 8 o'clock , depth of 1 cm 12- 1 o'clock 05/16/2017  4:00 PM  Margins Unattached edges (unapproximated) 05/17/2017  1:00 PM  Drainage Amount Moderate 05/17/2017  1:00 PM  Drainage Description Purulent;Serous;Odor 05/17/2017  1:00 PM  Treatment Cleansed;Debridement  (Selective);Hydrotherapy (Pulse lavage);Packing (Saline gauze) 05/17/2017  1:00 PM  Santyl applied to wound bed prior to dressing.   Hydrotherapy Pulsed lavage therapy - wound location: sacrum Pulsed Lavage with Suction (psi): 8 psi Pulsed Lavage with Suction - Normal Saline Used: 1000 mL Pulsed Lavage Tip: Tip with splash shield Selective Debridement Selective Debridement - Location: Sacrum Selective Debridement - Tools Used: Forceps;Scalpel;Scissors Selective Debridement - Tissue Removed: yellow necrotic tissue   Wound Assessment and Plan  Wound Therapy - Assess/Plan/Recommendations Wound Therapy - Clinical Statement: With selective debridement the area of tunneling at 1 o'clock was opened up and a moderate amount of yellow pus drained out of the area. [place] with the physician about surgical consult for more extensive debridement of the area.  Factors Delaying/Impairing Wound Healing: Diabetes Mellitus;Multiple medical problems;Immobility Hydrotherapy Plan: Debridement;Dressing change;Pulsatile lavage with suction;Patient/family education Wound Therapy - Frequency: 6X / week Wound Therapy - Current Recommendations: Surgery consult Wound Therapy - Follow Up Recommendations: Skilled nursing facility Wound Plan: see above  Wound Therapy Goals- Improve the function of patient's integumentary system by progressing the wound(s) through the phases of wound healing (inflammation - proliferation - remodeling) by: Decrease Necrotic Tissue to: 20 Decrease Necrotic Tissue - Progress: Not progressing Increase Granulation Tissue to: 80 Increase Granulation Tissue - Progress: Mot progressing Goals/treatment plan/discharge plan were made with and agreed upon by patient/family: Yes Time For Goal Achievement: 7 days Wound Therapy - Potential for Goals: Fair  Goals will be updated until  maximal potential achieved or discharge criteria met.  Discharge criteria: when goals achieved, discharge from  hospital, MD decision/surgical intervention, no progress towards goals, refusal/missing three consecutive treatments without notification or medical reason.  GP    [name]. [name], DPT Acute Rehabilitation  [phone] Pager [phone]   [place] 05/17/2017, 1:15 PM

## 2017-05-17 NOTE — Progress Notes (Addendum)
Subjective: No acute events overnight.  Patient remains afebrile with T-max of 39.5.  Her clinical status remains stable.  Continues to report lower back pain and bilateral lower extremity pain that is unchanged.  Discussed with patient Klebsiella pneumoniae found on urine culture that is likely a colonizer and not causing infection at this time therefore no antibiotic therapy will be initiated.  Also discussed likely discharge to SNF soon, hopefully today versus tomorrow.  Patient verbalized understanding and is in agreement with plan.  All questions answered.  ADDENDUM: Paged by PT around noon. Patient was noted to have a different tract at least ~ 4cm in length in her sacral wound at 12-1 o'clock. PT attempted debridement and noted purulent-appearing discharge from wound. She was unable to debride the area completely due to depth and extensive size. She reports patient is very tender in this area and that the area feels hard to the touch. She is concerned for ongoing infection/abscess.   Objective:  Vital signs in last 24 hours: Vitals:   05/16/17 0908 05/16/17 1653 05/16/17 2214 05/17/17 0524  BP: 110/60 (!) 118/59 (!) 123/53 (!) 125/55  Pulse: 85 72 73 75  Resp: 18 17 19 18   Temp: 98.2 F (36.8 C) 98.3 F (36.8 C) 99.5 F (37.5 C) 98.9 F (37.2 C)  TempSrc: Oral Oral Oral Oral  SpO2: 100% 99% 99% 100%  Weight:      Height:       Physical Exam  Constitutional: She is oriented to person, place, and time. She appears well-developed. No distress.  Thin female with depressed affect laying in bed on her left side due to lower back pain in no acute distress  Cardiovascular: Normal rate and regular rhythm. Exam reveals no gallop and no friction rub.  Murmur (2 /6 systolic murmur best heard at USB) heard. Pulmonary/Chest: Effort normal and breath sounds normal. No respiratory distress. She has no wheezes. She has no rales.  Abdominal: Soft. Bowel sounds are normal. She exhibits no  distension. There is no tenderness.  Musculoskeletal: She exhibits no edema.  Neurological: She is alert and oriented to person, place, and time.    Assessment/Plan:  Active Problems:   Type 1 diabetes mellitus (HCC)   Multiple sclerosis (HCC)   Neurogenic bladder   Pressure injury of skin   Hyperglycemia   Bacteriuria   Bacteremia due to vancomycin resistant Enterococcus   Proteus infection   Foley catheter in place on admission  # Fever # Leukocytosis Patient is overall clinically stable.  Afebrile x 3 days and hemodynamically stable. Tylenol was discontinued yesterday. WBC trending up and now at 31.7. Source of leukocytosis could be abscess/osteomyelitis given new tract at sacral ulcer site associated with purulent discharge. Her CT abdomen/pelvis on 3/14 was negative. However, given ongoing leukocytosis and new purulent drainage from sacral ulcer will pursue further imaging with MRI pelvis w/wo contrast to evaluate for abscess/osteomyelitis. Will likely need surgical consult for further debridement given extent of wound, will wait for MRI pelvis results. Patient also grew ESBL Klebsiella pneumonia in urine culture from 3/18. However, this is believed to be a nonpathologic colonizer as patient has an indwelling Foley catheter in place. Will therefore not treat with antibiotic therapy at this time, unless she acute decompensates.  - Follow up MRI pelvis w/wo contrast  - Will hold on antibiotic therapy for now pending MRI results  - Consider surgical consult for further debridement pending MRI results - ID following, appreciate recommendations  - Per  ID, if patient becomes clinically unstable, startmeropenem  # VRE and proteus bacteremia:Resolved.S/p 5 day of abx therapy with daptomycin and CTX>keflex. Repeat BCx have been neg x5d final. TTE neg for vegetations.   # T1DM:CBG in the 69 this AM. Previously doing well on current regimen. Labile CBGs in the setting of inconsistent PO  intake.  - Continue Lantus27 units qhs - Continue novolog 8 units tid - SSI-S + CBG monitoring   # Unstageable pressure ulcer: - Hydrotherapy M-Sat with Santyl dressings - IV dilaudid prn prior to hydrotherapy for pain  # MS: No evidence of flare currently. Will continue monitoring. - PT/OT for continued rehabilitation - Continue home muscle relaxants   Dispo: Anticipated discharge in approximately 1-2 day(s) pending arrangements to transfer to SNF.   [name], MD 05/17/2017, 6:11 AM Pager: [phone]

## 2017-05-17 NOTE — Progress Notes (Signed)
Inpatient Diabetes Program Recommendations  AACE/ADA: New Consensus Statement on Inpatient Glycemic Control (2015)  Target Ranges:  Prepandial:   less than 140 mg/dL      Peak postprandial:   less than 180 mg/dL (1-2 hours)      Critically ill patients:  140 - 180 mg/dL   Lab Results  Component Value Date   GLUCAP 69 05/17/2017   HGBA1C 10.3 (H) 04/19/2017    Review of Glycemic Control Results for SALITA, SUNDARAM (MRN [id number]) as of 05/17/2017 10:48  Ref. Range 05/16/2017 11:30 05/16/2017 16:51 05/16/2017 22:11 05/17/2017 07:35  Glucose-Capillary Latest Ref Range: 65 - 99 mg/dL 003 (H) 66 491 (H) 69   Diabetes history: Type 1 DM Outpatient Diabetes medications: Lantus 23 units QD, Novolog 2-10 units TIDAC Current orders for Inpatient glycemic control: Novolog 0-9 units TID, Novolog 8 units TIDAC, Lantus 27 units QHS.   Inpatient Diabetes Program Recommendations:    Patient had poor oral intake on 3/20. No recs for today. Will continue to observe for trends.   Thanks, Lujean Rave, MSN, RNC-OB Diabetes Coordinator [phone] (8a-5p)

## 2017-05-18 ENCOUNTER — Inpatient Hospital Stay (HOSPITAL_COMMUNITY): Payer: Medicaid Other

## 2017-05-18 LAB — GLUCOSE, CAPILLARY
GLUCOSE-CAPILLARY: 124 mg/dL — AB (ref 65–99)
GLUCOSE-CAPILLARY: 183 mg/dL — AB (ref 65–99)
Glucose-Capillary: 49 mg/dL — ABNORMAL LOW (ref 65–99)
Glucose-Capillary: 118 mg/dL — ABNORMAL HIGH (ref 65–99)
Glucose-Capillary: 50 mg/dL — ABNORMAL LOW (ref 65–99)
Glucose-Capillary: 78 mg/dL (ref 65–99)

## 2017-05-18 LAB — CBC
HCT: 22.7 % — ABNORMAL LOW (ref 36.0–46.0)
HEMOGLOBIN: 7.3 g/dL — AB (ref 12.0–15.0)
MCH: 27.8 pg (ref 26.0–34.0)
MCHC: 32.2 g/dL (ref 30.0–36.0)
MCV: 86.3 fL (ref 78.0–100.0)
Platelets: 405 10*3/uL — ABNORMAL HIGH (ref 150–400)
RBC: 2.63 MIL/uL — ABNORMAL LOW (ref 3.87–5.11)
RDW: 15.1 % (ref 11.5–15.5)
WBC: 30.2 10*3/uL — ABNORMAL HIGH (ref 4.0–10.5)

## 2017-05-18 LAB — BASIC METABOLIC PANEL
Anion gap: 9 (ref 5–15)
BUN: 15 mg/dL (ref 6–20)
CALCIUM: 8.5 mg/dL — AB (ref 8.9–10.3)
CHLORIDE: 102 mmol/L (ref 101–111)
CO2: 21 mmol/L — ABNORMAL LOW (ref 22–32)
CREATININE: 1.17 mg/dL — AB (ref 0.44–1.00)
GFR calc non Af Amer: 53 mL/min — ABNORMAL LOW (ref >60)
Glucose, Bld: 59 mg/dL — ABNORMAL LOW (ref 65–99)
Potassium: 4.1 mmol/L (ref 3.5–5.1)
Sodium: 132 mmol/L — ABNORMAL LOW (ref 135–145)

## 2017-05-18 MED ORDER — COLLAGENASE 250 UNIT/GM EX OINT
TOPICAL_OINTMENT | Freq: Two times a day (BID) | CUTANEOUS | Status: DC
  Administered 2017-05-19 – 2017-05-25 (×11): via TOPICAL
  Filled 2017-05-18 (×3): qty 30

## 2017-05-18 MED ORDER — INSULIN GLARGINE 100 UNIT/ML ~~LOC~~ SOLN
23.0000 [IU] | Freq: Every day | SUBCUTANEOUS | Status: DC
  Administered 2017-05-18 – 2017-05-20 (×4): 23 [IU] via SUBCUTANEOUS
  Filled 2017-05-18 (×3): qty 0.23

## 2017-05-18 MED ORDER — SODIUM CHLORIDE 0.9 % IV SOLN
1.0000 g | Freq: Two times a day (BID) | INTRAVENOUS | Status: DC
  Administered 2017-05-18 – 2017-05-25 (×15): 1 g via INTRAVENOUS
  Filled 2017-05-18 (×16): qty 1

## 2017-05-18 MED ORDER — DEXTROSE 50 % IV SOLN
INTRAVENOUS | Status: AC
  Administered 2017-05-18: 50 mL
  Filled 2017-05-18: qty 50

## 2017-05-18 NOTE — Clinical Social Work Note (Signed)
CSW continuing to monitor patient's progress. Reviewed resident's note for today and patient's "Anticipated discharge in approximately 3-5 day(s)".  CSW will facilitate discharge back to [facility] when medically stable.  Genelle Bal, MSW, LCSW Licensed Clinical Social Worker Clinical Social Work Department [employer] [phone]

## 2017-05-18 NOTE — Progress Notes (Signed)
   Subjective:  No acute events overnight. Continues to complain of lower back and bilateral extremity pain. Otherwise no new complaints. Discussed with patient source of infection is likely the sacral ulcer on her back and will start antibiotic therapy for this. Also discussed MRI pelvis for further evaluation and surgical consult for debridement. Patient verbalized understanding and is in agreement with plan.    Objective:  Vital signs in last 24 hours: Vitals:   05/17/17 0900 05/17/17 1711 05/17/17 2154 05/18/17 0559  BP: 122/62 (!) 116/48 (!) 115/57 120/64  Pulse: 76 74 73 71  Resp: 18 18 18 18   Temp: 98.8 F (37.1 C) 99 F (37.2 C) 99.3 F (37.4 C) 97.7 F (36.5 C)  TempSrc: Oral Oral Oral Oral  SpO2: 100% 100% 100% 94%  Weight:   126 lb (57.2 kg)   Height:       Physical Exam  Constitutional: She is oriented to person, place, and time.  Female with very depressed affect who appears uncomfortable from pain but is otherwise in no acute distress   Cardiovascular: Normal rate and regular rhythm. Exam reveals no gallop and no friction rub.  Murmur (2/6 syst olic murmur best heard at USB) heard. Pulmonary/Chest: Effort normal. No respiratory distress.  Abdominal: Soft. Bowel sounds are normal. She exhibits no distension. There is no tenderness.  Musculoskeletal: She exhibits no edema.  Continues to have lower back pain   Neurological: She is alert and oriented to person, place, and time.    Assessment/Plan:  Active Problems:   Type 1 diabetes mellitus (HCC)   Multiple sclerosis (HCC)   Neurogenic bladder   Pressure injury of skin   Hyperglycemia   Bacteriuria   Bacteremia due to vancomycin resistant Enterococcus   Proteus infection   Foley catheter in place on admission  # Fever # Leukocytosis Remains afebrile and hemodynamically stable. WBC 30.1. PT noted purulent discharge from sacral wound yesterday and patient was started on meropenem per ID recommendations as  this is believed to be theecommendations   - Consider surgical consul source of her infection. Getting MRI pelvis for further evaluation of wound. Will also consult general surgery for debridement of wound.  - Surgery consult, appreciate assistance and recommendations  - ID following, appreciate recommendations  - Follow up MRI pelvis w/wo contrast   # VRE and proteus bacteremia:Resolved.S/p 5 day of abx therapy with daptomycin and CTX>keflex. Repeat BCx have been neg x5d final. TTE neg for vegetations.   # T1DM:CBGs 49 this morning. Patient with very poor PO intake yesterday and states this is normal for her when asked about it during rounds. Will change insulin regimen as below.   - Decrease Lantus27--> 23 units qhs - Continue novolog 8 units tid - SSI-S + CBG monitoring   # Unstageable pressure ulcer: - Hydrotherapy M-Sat with Santyl dressings - IV dilaudid prn prior to hydrotherapy for pain  # MS: No evidence of flare currently. Will continue monitoring. - PT/OT for continued rehabilitation - Continue home muscle relaxants    Dispo: Anticipated discharge in approximately 3-5 day(s).   [name], MD 05/18/2017, 6:21 AM Pager: [phone]

## 2017-05-18 NOTE — Progress Notes (Signed)
Patient ID: Debra [name], female   DOB: 05/10/1965, 51 y.o.   MRN: [id number]          [facility]  Date of Admission:  05/05/2017     ASSESSMENT: It is now clear that she has sacral wound infection.  I will start meropenem.  Recommend general surgery consultation.  PLAN: 1. Start meropenem 2. Recommend general surgery evaluation 3. Please call Dr. Judyann Munson [phone] for any infectious disease questions this weekend  Active Problems:   Bacteremia due to vancomycin resistant Enterococcus   Proteus infection   Type 1 diabetes mellitus (HCC)   Multiple sclerosis (HCC)   Neurogenic bladder   Pressure injury of skin   Hyperglycemia   Bacteriuria   Foley catheter in place on admission   Scheduled Meds: . amLODipine  10 mg Oral Daily  . baclofen  5 mg Oral TID  . bethanechol  50 mg Oral TID  . collagenase   Topical Daily  . enoxaparin (LOVENOX) injection  40 mg Subcutaneous Q24H  . insulin aspart  0-9 Units Subcutaneous TID WC  . insulin aspart  8 Units Subcutaneous TID WC  . insulin glargine  27 Units Subcutaneous QHS  . lactose free nutrition  237 mL Oral TID WC  . lisinopril  20 mg Oral Daily  . mouth rinse  15 mL Mouth Rinse BID  . metoprolol succinate  100 mg Oral Daily  . risperiDONE  1 mg Oral QHS  . tamsulosin  0.4 mg Oral Daily  . Vitamin D (Ergocalciferol)  50,000 Units Oral Q7 days   Continuous Infusions: PRN Meds:.HYDROmorphone (DILAUDID) injection, HYDROmorphone (DILAUDID) injection, oxyCODONE   Review of Systems: Review of Systems  Unable to perform ROS: Other  Constitutional:       She is currently undergoing therapy for her sacral wound.    Allergies  Allergen Reactions  . Penicillins Anaphylaxis, Nausea And Vomiting and Rash    Has patient had a PCN reaction causing immediate rash, facial/tongue/throat swelling, SOB or lightheadedness with hypotension: Yes Has patient had a PCN reaction causing severe  rash involving mucus membranes or skin necrosis: Yes Has patient had a PCN reaction that required hospitalization No Has patient had a PCN reaction occurring within the last 10 years: Yes If all of the above answers are "NO", then may proceed with Cephalosporin use.   . Pollen Extract Other (See Comments)    Seasonal Allergies  . Tape Rash    OBJECTIVE: Vitals:   05/17/17 1711 05/17/17 2154 05/18/17 0559 05/18/17 0844  BP: (!) 116/48 (!) 115/57 120/64 (!) 123/58  Pulse: 74 73 71 76  Resp: 18 18 18 18   Temp: 99 F (37.2 C) 99.3 F (37.4 C) 97.7 F (36.5 C) 98.3 F (36.8 C)  TempSrc: Oral Oral Oral Oral  SpO2: 100% 100% 94% 98%  Weight:  126 lb (57.2 kg)    Height:       Body mass index is 20.97 kg/m.  Physical Exam  Musculoskeletal:  She now has purulent drainage and odor from her sacral wound.  It is much deeper now on has significant undermining at the 1:00 and 5:00 positions.    Lab Results Lab Results  Component Value Date   WBC 30.2 (H) 05/18/2017   HGB 7.3 (L) 05/18/2017   HCT 22.7 (L) 05/18/2017   MCV 86.3 05/18/2017   PLT 405 (H) 05/18/2017    Lab Results  Component Value Date   CREATININE 1.17 (  H) 05/18/2017   BUN 15 05/18/2017   NA 132 (L) 05/18/2017   K 4.1 05/18/2017   CL 102 05/18/2017   CO2 21 (L) 05/18/2017    Lab Results  Component Value Date   ALT 12 (L) 05/05/2017   AST 14 (L) 05/05/2017   ALKPHOS 92 05/05/2017   BILITOT 0.4 05/05/2017     Microbiology: Recent Results (from the past 240 hour(s))  Culture, blood (routine x 2)     Status: None (Preliminary result)   Collection Time: 05/14/17 11:55 AM  Result Value Ref Range Status   Specimen Description BLOOD LEFT ANTECUBITAL  Final   Special Requests IN PEDIATRIC BOTTLE Blood Culture adequate volume  Final   Culture   Final    NO GROWTH 3 DAYS Performed at [facility], [address], [place], [place] [address]    Report Status PENDING  Incomplete  Culture, blood (routine  x 2)     Status: None (Preliminary result)   Collection Time: 05/14/17 11:58 AM  Result Value Ref Range Status   Specimen Description BLOOD LEFT ANTECUBITAL  Final   Special Requests IN PEDIATRIC BOTTLE Blood Culture adequate volume  Final   Culture   Final    NO GROWTH 3 DAYS Performed at [facility], [address] [place], [place] [address]    Report Status PENDING  Incomplete  Culture, Urine     Status: Abnormal   Collection Time: 05/14/17  2:02 PM  Result Value Ref Range Status   Specimen Description URINE, CATHETERIZED  Final   Special Requests NONE  Final   Culture (A)  Final    >=100,000 COLONIES/mL KLEBSIELLA PNEUMONIAE Confirmed Extended Spectrum Beta-Lactamase Producer (ESBL).  In bloodstream infections from ESBL organisms, carbapenems are preferred over piperacillin/tazobactam. They are shown to have a lower risk of mortality. Performed at [facility], [address], [place], [place] [address]    Report Status 05/16/2017 FINAL  Final   Organism ID, Bacteria KLEBSIELLA PNEUMONIAE (A)  Final      Susceptibility   Klebsiella pneumoniae - MIC*    AMPICILLIN >=32 RESISTANT Resistant     CEFAZOLIN >=64 RESISTANT Resistant     CEFTRIAXONE >=64 RESISTANT Resistant     CIPROFLOXACIN >=4 RESISTANT Resistant     GENTAMICIN >=16 RESISTANT Resistant     IMIPENEM <=0.25 SENSITIVE Sensitive     NITROFURANTOIN 128 RESISTANT Resistant     TRIMETH/SULFA >=320 RESISTANT Resistant     AMPICILLIN/SULBACTAM >=32 RESISTANT Resistant     PIP/TAZO >=128 RESISTANT Resistant     Extended ESBL POSITIVE Resistant     * >=100,000 COLONIES/mL KLEBSIELLA PNEUMONIAE    Cliffton Asters, MD [facility] [phone] pager   [phone] cell 05/18/2017, 9:50 AM

## 2017-05-18 NOTE — Consult Note (Signed)
[facility] Surgery Consult Note  Debra [name] [birth year]  [id number].    Requesting MD: Welford Roche Chief Complaint/Reason for Consult: sacral wound  HPI:  Debra [name] is a 52yo female PMH DM-1, neurogenic bladder with chronic indwelling foley catheter, and bedbound due to MS, who was admitted to [facility] 3/9 with hyperglycemia. She developed a fever on 3/9 and blood cultures with Proteus and Enterococcus. She completed 5 days of antibiotic therapy (Daptomycin, Rocephin->Keflex) and was off antibiotics until today when she was started on merrem for persistent leukocytosis and fevers. Patient complains of pain at sacral wound site. Concern that her sacral wound could be the source of her infection, so general surgery was asked to consult. MRI pending. Per chart sacral wound was present prior to this admission. They were performing wound care at SNF but she had not been on any antibiotics. Since admission hydrotherapy was initiated 3/11 and she has been undergoing wet to dry dressing changes with Santyl once daily.   - PMH significant for type I diabetes, MS, neurogenic bladder with chronic indwelling foley, hypertension, CKD stage III, recurrent UTIs - Abdominal surgical history: cholecystectomy - Nonsmoker - Resides at [facility]: Review of Systems  Constitutional: Positive for fever and malaise/fatigue.  HENT: Negative.   Eyes: Negative.   Respiratory: Negative.   Cardiovascular: Negative.   Gastrointestinal: Negative.   Genitourinary: Negative.   Musculoskeletal: Positive for joint pain.  Skin:       Sacral wound  Neurological: Negative.     All systems reviewed and otherwise negative except for as above  Family History  Problem Relation Age of Onset  . Hypertension Mother   . Kidney disease Mother   . Hypertension Father   . Breast cancer Maternal Grandmother   . Prostate cancer Maternal Grandfather   . Ovarian cancer Paternal  Grandmother   . Prostate cancer Paternal Grandfather   . Colon cancer Maternal Uncle        Family history of malignant neoplasm of gastrointestinal tract    Past Medical History:  Diagnosis Date  . Adenomatous colonic polyps   . Anemia    2005  . Anxiety    1990  . Arthritis    "knees, hands" (05/07/2017)  . Asthma    2000  . Benign essential HTN   . Cataract   . Chronic lower back pain   . Depression with psychotic symptoms   . Diabetic ketoacidosis without coma associated with type 1 diabetes mellitus ([place])   . Difficult intubation    narrow airway  . Fibromyalgia 11/07/2016  . Gastroesophageal Reflux Disease (GERD)   . Heart murmur    Birth  . Hyperlipidemia    2005  . Hypertension    1998  . Internal hemorrhoids 04/24/06   on colonoscopy  . Migraine    "used to have them all the time; now maybe q6 months" (05/07/2017)  . Multiple sclerosis ([place])   . Neuropathic bladder   . Neuropathy of the hands & feet   . Restless Leg Syndrome   . Sepsis due to urinary tract infection ([place]) 04/12/2017  . Sleep paralysis   . Stable angina pectoris    2007: cath showing normal cors.   . Stroke 1990   denies residual on 05/07/2017  . Type I Diabetes Mellitus 1988    Past Surgical History:  Procedure Laterality Date  . BREAST SURGERY Left    biopsy left breast  . CARDIAC CATHETERIZATION  ~  2007/2008  . COLONOSCOPY W/ BIOPSIES AND POLYPECTOMY    . DILATION AND CURETTAGE OF UTERUS  12/29/2010   Surgeon: Osborne Oman, MD;  Location: [facility];  Service: Gynecology  . ENDOMETRIAL ABLATION W/ NOVASURE N/A 12/2009  . EYE SURGERY Bilateral    "lots; laser tto correct broken blood vessels from diabetes"  . FOOT SURGERY Bilateral    "bones fused; bones removed"  . FRACTURE SURGERY    . HYSTEROSCOPY  12/29/2010   Procedure: HYSTEROSCOPY WITH HYDROTHERMAL ABLATION;  Surgeon: Osborne Oman, MD;  Location: [facility];  Service: Gynecology;;  . IUD REMOVAL  12/29/2010   Procedure:  INTRAUTERINE DEVICE (IUD) REMOVAL;  Surgeon: Osborne Oman, MD;  Location: [facility];  Service: Gynecology;  Laterality: N/A;  . LAPAROSCOPIC CHOLECYSTECTOMY    . LAPAROSCOPIC TUBAL LIGATION  12/29/2010   Procedure: LAPAROSCOPIC TUBAL LIGATION;  Surgeon: Osborne Oman, MD;  Location: [facility];  Service: Gynecology;  Laterality: Bilateral;  . ORIF ANKLE FRACTURE Right 10/09/2013   Procedure: Open reduction internal fixation right ankle;  Surgeon: Nita Sells, MD;  Location: [facility];  Service: Orthopedics;  Laterality: Right;  Open reduction internal fixation right ankle  . TRIGGER FINGER RELEASE     x 3  . TUBAL LIGATION      Social History:  reports that she has never smoked. She has never used smokeless tobacco. She reports that she does not drink alcohol or use drugs.  Allergies:  Allergies  Allergen Reactions  . Penicillins Anaphylaxis, Nausea And Vomiting and Rash    Has patient had a PCN reaction causing immediate rash, facial/tongue/throat swelling, SOB or lightheadedness with hypotension: Yes Has patient had a PCN reaction causing severe rash involving mucus membranes or skin necrosis: Yes Has patient had a PCN reaction that required hospitalization No Has patient had a PCN reaction occurring within the last 10 years: Yes If all of the above answers are "NO", then may proceed with Cephalosporin use.   . Pollen Extract Other (See Comments)    Seasonal Allergies  . Tape Rash    Medications Prior to Admission  Medication Sig Dispense Refill  . acetaminophen (TYLENOL) 325 MG tablet Take 650 mg by mouth every 4 (four) hours as needed for mild pain or fever.     Marland Kitchen amLODipine (NORVASC) 10 MG tablet Take 1 tablet (10 mg total) by mouth daily. 90 tablet 1  . aspirin EC 81 MG tablet Take 1 tablet (81 mg total) by mouth daily. 90 tablet 3  . atorvastatin (LIPITOR) 40 MG tablet Take 1 tablet (40 mg total) by mouth daily. 90 tablet 3  . Baclofen 5 MG TABS Take 5 mg by mouth 3  (three) times daily. 5 tablet 0  . bethanechol (URECHOLINE) 50 MG tablet Take 1 tablet (50 mg total) by mouth 3 (three) times daily.    Marland Kitchen GLUCAGON HCL, RDNA, IJ Inject 1 mg into the muscle as needed (hypoglycemia-administer for CBG <60).    . insulin aspart (NOVOLOG) 100 UNIT/ML injection Inject 2-10 Units into the skin 3 (three) times daily before meals. 150-200=2 units 201-250=4 units 241-300=6 units 301-350=8 units 351-400=10 units    . insulin glargine (LANTUS) 100 UNIT/ML injection Inject 0.23 mLs (23 Units total) into the skin daily.    Marland Kitchen lidocaine (LIDODERM) 5 % Place 1 patch onto the skin daily. FOR LOWER BACK PAIN Remove & Discard patch within 12 hours or as directed by MD    . lisinopril (PRINIVIL,ZESTRIL) 20  MG tablet Take 1 tablet (20 mg total) by mouth daily.    Marland Kitchen loperamide (IMODIUM) 2 MG capsule Take 2 capsules (4 mg total) by mouth as needed for diarrhea or loose stools.    . magnesium hydroxide (MILK OF MAGNESIA) 400 MG/5ML suspension Take 30 mLs by mouth daily as needed for mild constipation. 360 mL 0  . meclizine (ANTIVERT) 12.5 MG tablet Take 12.5 mg by mouth See admin instructions. 12.'5mg'$  by mouth as needed for dizziness one hour before therapy    . metoprolol succinate (TOPROL-XL) 100 MG 24 hr tablet Take 1 tablet (100 mg total) by mouth daily. Take with or immediately following a meal. 90 tablet 0  . mirtazapine (REMERON) 45 MG tablet Take 1 tablet (45 mg total) by mouth at bedtime.    . ondansetron (ZOFRAN) 4 MG tablet Take 4 mg by mouth daily.    Marland Kitchen oxyCODONE (OXY IR/ROXICODONE) 5 MG immediate release tablet Take 5 mg by mouth every 8 (eight) hours as needed for severe pain.    . pantoprazole (PROTONIX) 40 MG tablet Take 1 tablet (40 mg total) by mouth daily.    . polyethylene glycol (MIRALAX / GLYCOLAX) packet Take 17 g by mouth daily as needed for mild constipation, moderate constipation or severe constipation. 14 each 0  . risperiDONE (RISPERDAL) 1 MG tablet Take 1  tablet (1 mg total) at bedtime by mouth.    . tamsulosin (FLOMAX) 0.4 MG CAPS capsule Take 1 capsule (0.4 mg total) daily by mouth. 30 capsule   . Vitamin D, Ergocalciferol, (DRISDOL) 50000 units CAPS capsule Take 1 capsule (50,000 Units total) by mouth every 7 (seven) days. (Patient taking differently: Take 50,000 Units by mouth every 7 (seven) days. On Thursdays) 30 capsule   . zolpidem (AMBIEN) 5 MG tablet Take 1 tablet (5 mg total) by mouth at bedtime as needed for sleep. 5 tablet 0  . feeding supplement, GLUCERNA SHAKE, (GLUCERNA SHAKE) LIQD Take 237 mLs by mouth 2 (two) times daily between meals. (Patient not taking: Reported on 05/05/2017)  0  . protein supplement shake (PREMIER PROTEIN) LIQD Take 325 mLs (11 oz total) by mouth 2 (two) times daily between meals. (Patient not taking: Reported on 05/05/2017)  0  . traMADol (ULTRAM) 50 MG tablet Take 1 tablet (50 mg total) by mouth every 6 (six) hours as needed for moderate pain or severe pain. (Patient not taking: Reported on 05/05/2017) 5 tablet 0    Prior to Admission medications   Medication Sig Start Date End Date Taking? Authorizing Provider  acetaminophen (TYLENOL) 325 MG tablet Take 650 mg by mouth every 4 (four) hours as needed for mild pain or fever.    Yes [provider]  amLODipine (NORVASC) 10 MG tablet Take 1 tablet (10 mg total) by mouth daily. 02/14/17 02/14/18 Yes Katherine Roan, MD  aspirin EC 81 MG tablet Take 1 tablet (81 mg total) by mouth daily. 02/14/17  Yes Katherine Roan, MD  atorvastatin (LIPITOR) 40 MG tablet Take 1 tablet (40 mg total) by mouth daily. 02/14/17  Yes Katherine Roan, MD  Baclofen 5 MG TABS Take 5 mg by mouth 3 (three) times daily. 04/24/17  Yes Mariel Aloe, MD  bethanechol (URECHOLINE) 50 MG tablet Take 1 tablet (50 mg total) by mouth 3 (three) times daily. 02/14/17  Yes Winfrey, Jenne Pane, MD  GLUCAGON HCL, RDNA, IJ Inject 1 mg into the muscle as needed (hypoglycemia-administer for  CBG <60).   Yes  [provider]  insulin aspart (NOVOLOG) 100 UNIT/ML injection Inject 2-10 Units into the skin 3 (three) times daily before meals. 150-200=2 units 201-250=4 units 241-300=6 units 301-350=8 units 351-400=10 units   Yes [provider]  insulin glargine (LANTUS) 100 UNIT/ML injection Inject 0.23 mLs (23 Units total) into the skin daily. 04/25/17  Yes Mariel Aloe, MD  lidocaine (LIDODERM) 5 % Place 1 patch onto the skin daily. FOR LOWER BACK PAIN Remove & Discard patch within 12 hours or as directed by MD   Yes [provider]  lisinopril (PRINIVIL,ZESTRIL) 20 MG tablet Take 1 tablet (20 mg total) by mouth daily. 02/15/17  Yes Katherine Roan, MD  loperamide (IMODIUM) 2 MG capsule Take 2 capsules (4 mg total) by mouth as needed for diarrhea or loose stools. 04/24/17  Yes Mariel Aloe, MD  magnesium hydroxide (MILK OF MAGNESIA) 400 MG/5ML suspension Take 30 mLs by mouth daily as needed for mild constipation. 02/14/17  Yes Katherine Roan, MD  meclizine (ANTIVERT) 12.5 MG tablet Take 12.5 mg by mouth See admin instructions. 12.'5mg'$  by mouth as needed for dizziness one hour before therapy   Yes [provider]  metoprolol succinate (TOPROL-XL) 100 MG 24 hr tablet Take 1 tablet (100 mg total) by mouth daily. Take with or immediately following a meal. 02/15/17  Yes Winfrey, Jenne Pane, MD  mirtazapine (REMERON) 45 MG tablet Take 1 tablet (45 mg total) by mouth at bedtime. 04/24/17  Yes Mariel Aloe, MD  ondansetron (ZOFRAN) 4 MG tablet Take 4 mg by mouth daily.   Yes [provider]  oxyCODONE (OXY IR/ROXICODONE) 5 MG immediate release tablet Take 5 mg by mouth every 8 (eight) hours as needed for severe pain.   Yes [provider]  pantoprazole (PROTONIX) 40 MG tablet Take 1 tablet (40 mg total) by mouth daily. 02/15/17  Yes Katherine Roan, MD  polyethylene glycol (MIRALAX / GLYCOLAX) packet Take 17 g by mouth daily as  needed for mild constipation, moderate constipation or severe constipation. 02/14/17  Yes Katherine Roan, MD  risperiDONE (RISPERDAL) 1 MG tablet Take 1 tablet (1 mg total) at bedtime by mouth. 01/02/17  Yes Gouru, Illene Silver, MD  tamsulosin (FLOMAX) 0.4 MG CAPS capsule Take 1 capsule (0.4 mg total) daily by mouth. 01/03/17  Yes Gouru, Illene Silver, MD  Vitamin D, Ergocalciferol, (DRISDOL) 50000 units CAPS capsule Take 1 capsule (50,000 Units total) by mouth every 7 (seven) days. Patient taking differently: Take 50,000 Units by mouth every 7 (seven) days. On Thursdays 02/15/17  Yes Katherine Roan, MD  zolpidem (AMBIEN) 5 MG tablet Take 1 tablet (5 mg total) by mouth at bedtime as needed for sleep. 04/24/17  Yes Mariel Aloe, MD  feeding supplement, GLUCERNA SHAKE, (GLUCERNA SHAKE) LIQD Take 237 mLs by mouth 2 (two) times daily between meals. Patient not taking: Reported on 05/05/2017 02/14/17   Katherine Roan, MD  protein supplement shake (PREMIER PROTEIN) LIQD Take 325 mLs (11 oz total) by mouth 2 (two) times daily between meals. Patient not taking: Reported on 05/05/2017 04/24/17   Mariel Aloe, MD  traMADol (ULTRAM) 50 MG tablet Take 1 tablet (50 mg total) by mouth every 6 (six) hours as needed for moderate pain or severe pain. Patient not taking: Reported on 05/05/2017 04/24/17   Mariel Aloe, MD    Blood pressure (!) 123/58, pulse 76, temperature 98.3 F (36.8 C), temperature source Oral, resp. rate 18, height '5\' 5"'$  (  1.651 m), weight 126 lb (57.2 kg), SpO2 98 %. Physical Exam: General: WD/WN AA female who is laying in bed in NAD HEENT: head is normocephalic, atraumatic.  Sclera are noninjected.  Pupils equal and round.  Ears and nose without any masses or lesions.  Mouth is pink and moist. Dentition fair Heart: regular, rate, and rhythm.  +murmur.  Palpable pedal pulses bilaterally Lungs: CTAB, no wheezes, rhonchi, or rales noted.  Respiratory effort nonlabored Abd: soft, NT/ND, +BS, no  masses, hernias, or organomegaly MS: all 4 extremities are symmetrical with no cyanosis, clubbing, or edema. Skin: warm and dry with no masses, lesions, or rashes Psych: A&Ox3 with flat affect. Neuro: cranial nerves grossly intact, normal speech GU:     Results for orders placed or performed during the hospital encounter of 05/05/17 (from the past 48 hour(s))  Glucose, capillary     Status: None   Collection Time: 05/16/17  4:51 PM  Result Value Ref Range   Glucose-Capillary 66 65 - 99 mg/dL  Glucose, capillary     Status: Abnormal   Collection Time: 05/16/17 10:11 PM  Result Value Ref Range   Glucose-Capillary 125 (H) 65 - 99 mg/dL   Comment 1 Notify RN    Comment 2 Document in Chart   Glucose, capillary     Status: None   Collection Time: 05/17/17  7:35 AM  Result Value Ref Range   Glucose-Capillary 69 65 - 99 mg/dL  CBC     Status: Abnormal   Collection Time: 05/17/17  7:58 AM  Result Value Ref Range   WBC 31.7 (H) 4.0 - 10.5 K/uL   RBC 2.88 (L) 3.87 - 5.11 MIL/uL   Hemoglobin 8.1 (L) 12.0 - 15.0 g/dL   HCT 24.9 (L) 36.0 - 46.0 %   MCV 86.5 78.0 - 100.0 fL   MCH 28.1 26.0 - 34.0 pg   MCHC 32.5 30.0 - 36.0 g/dL   RDW 15.0 11.5 - 15.5 %   Platelets 424 (H) 150 - 400 K/uL    Comment: Performed at [facility], [address] [place], [place] [address]  Basic metabolic panel     Status: Abnormal   Collection Time: 05/17/17  7:58 AM  Result Value Ref Range   Sodium 131 (L) 135 - 145 mmol/L   Potassium 4.2 3.5 - 5.1 mmol/L   Chloride 102 101 - 111 mmol/L   CO2 21 (L) 22 - 32 mmol/L   Glucose, Bld 62 (L) 65 - 99 mg/dL   BUN 17 6 - 20 mg/dL   Creatinine, Ser 1.19 (H) 0.44 - 1.00 mg/dL   Calcium 8.5 (L) 8.9 - 10.3 mg/dL   GFR calc non Af Amer 52 (L) >60 mL/min   GFR calc Af Amer >60 >60 mL/min    Comment: (NOTE) The eGFR has been calculated using the CKD EPI equation. This calculation has not been validated in all clinical situations. eGFR's persistently <60  mL/min signify possible Chronic Kidney Disease.    Anion gap 8 5 - 15    Comment: Performed at [facility] [address] [place], [place] [address]  Glucose, capillary     Status: Abnormal   Collection Time: 05/17/17 11:42 AM  Result Value Ref Range   Glucose-Capillary 103 (H) 65 - 99 mg/dL  Glucose, capillary     Status: Abnormal   Collection Time: 05/17/17  4:44 PM  Result Value Ref Range   Glucose-Capillary 112 (H) 65 - 99 mg/dL  CBC     Status: Abnormal   Collection Time: 05/18/17  6:14 AM  Result Value Ref Range   WBC 30.2 (H) 4.0 - 10.5 K/uL   RBC 2.63 (L) 3.87 - 5.11 MIL/uL   Hemoglobin 7.3 (L) 12.0 - 15.0 g/dL   HCT 22.7 (L) 36.0 - 46.0 %   MCV 86.3 78.0 - 100.0 fL   MCH 27.8 26.0 - 34.0 pg   MCHC 32.2 30.0 - 36.0 g/dL   RDW 15.1 11.5 - 15.5 %   Platelets 405 (H) 150 - 400 K/uL    Comment: Performed at [facility], [place] [address], [place], [place] [address]  Basic metabolic panel     Status: Abnormal   Collection Time: 05/18/17  6:14 AM  Result Value Ref Range   Sodium 132 (L) 135 - 145 mmol/L   Potassium 4.1 3.5 - 5.1 mmol/L   Chloride 102 101 - 111 mmol/L   CO2 21 (L) 22 - 32 mmol/L   Glucose, Bld 59 (L) 65 - 99 mg/dL   BUN 15 6 - 20 mg/dL   Creatinine, Ser 1.17 (H) 0.44 - 1.00 mg/dL   Calcium 8.5 (L) 8.9 - 10.3 mg/dL   GFR calc non Af Amer 53 (L) >60 mL/min   GFR calc Af Amer >60 >60 mL/min    Comment: (NOTE) The eGFR has been calculated using the CKD EPI equation. This calculation has not been validated in all clinical situations. eGFR's persistently <60 mL/min signify possible Chronic Kidney Disease.    Anion gap 9 5 - 15    Comment: Performed at [facility] [address], [place], [place] [address]  Glucose, capillary     Status: Abnormal   Collection Time: 05/18/17  7:36 AM  Result Value Ref Range   Glucose-Capillary 49 (L) 65 - 99 mg/dL  Glucose, capillary     Status: Abnormal   Collection Time: 05/18/17  8:08 AM  Result  Value Ref Range   Glucose-Capillary 124 (H) 65 - 99 mg/dL  Glucose, capillary     Status: Abnormal   Collection Time: 05/18/17 11:56 AM  Result Value Ref Range   Glucose-Capillary 183 (H) 65 - 99 mg/dL   No results found.  Anti-infectives (From admission, onward)   Start     Dose/Rate Route Frequency Ordered Stop   05/18/17 1030  meropenem (MERREM) 1 g in sodium chloride 0.9 % 100 mL IVPB     1 g 200 mL/hr over 30 Minutes Intravenous Every 12 hours 05/18/17 0955     05/08/17 2000  DAPTOmycin (CUBICIN) 600 mg in sodium chloride 0.9 % IVPB  Status:  Discontinued     600 mg 224 mL/hr over 30 Minutes Intravenous Every 24 hours 05/08/17 1049 05/10/17 1004   05/08/17 1200  cephALEXin (KEFLEX) capsule 250 mg  Status:  Discontinued     250 mg Oral Every 6 hours 05/08/17 1049 05/10/17 1004   05/06/17 0730  vancomycin (VANCOCIN) 500 mg in sodium chloride 0.9 % 100 mL IVPB  Status:  Discontinued     500 mg 100 mL/hr over 60 Minutes Intravenous Every 12 hours 05/05/17 1924 05/05/17 1956   05/05/17 2000  DAPTOmycin (CUBICIN) 600 mg in sodium chloride 0.9 % IVPB  Status:  Discontinued     600 mg 224 mL/hr over 30 Minutes Intravenous Every 24 hours 05/05/17 1956 05/08/17 1032   05/05/17 1930  cefTRIAXone (ROCEPHIN) 2 g in sodium chloride 0.9 % 100 mL IVPB  Status:  Discontinued     2 g 200 mL/hr over 30 Minutes Intravenous Every 24 hours 05/05/17 1922 05/08/17 1032   05/05/17 1930  vancomycin (VANCOCIN) IVPB 1000 mg/200 mL premix  Status:  Discontinued     1,000 mg 200 mL/hr over 60 Minutes Intravenous  Once 05/05/17 1924 05/05/17 1956       Assessment/Plan Type I diabetes MS Bedbound Neurogenic bladder with chronic indwelling foley HTN CKD stage III  Sacral wound - Patient seen and evaluated with Dr. Grandville Silos. Increase wet to dry dressing changes to BID. Apply Santyl twice daily. Continue hydrotherapy. If wound does not clean up may consider debridement in the OR next week. Will watch  for MRI.   Wellington Hampshire, [facility] Surgery 05/18/2017, 2:09 PM Pager: [phone] Consults: [phone] Mon-Fri 7:00 am-4:30 pm Sat-Sun 7:00 am-11:30 am

## 2017-05-18 NOTE — Progress Notes (Signed)
  CBG 49. Hypoglycemia standing orders initiated. Recheck CBG was 124. Will continue to monitor pt.

## 2017-05-18 NOTE — Progress Notes (Signed)
Physical Therapy Wound Treatment Patient Details  Name: Debra [name] MRN: [id number] Date of Birth: 01/03/1966  Today's Date: 05/18/2017 Time: 5462-7035 Time Calculation (min): 38 min  Subjective  Subjective: Pt reports her jaw is hurting today Patient and Family Stated Goals: have wound heal and pain decrease  Pain Score: Pain Score: Pt premedicated prior to treatment, Pt very tearful with 10/10 pain based on facial Pain score.  Wound Assessment  Pressure Injury 05/07/17 Unstageable - Full thickness tissue loss in which the base of the ulcer is covered by slough (yellow, tan, gray, green or brown) and/or eschar (tan, brown or black) in the wound bed. Sacral Wound to R of midline (Active)  Wound Image   05/16/2017  9:00 AM  Dressing Type ABD;Moist to dry;Barrier Film (skin prep) 05/18/2017 10:00 AM  Dressing Changed;Clean;Dry;Intact 05/18/2017 10:00 AM  Dressing Change Frequency Daily 05/18/2017 10:00 AM  State of Healing Early/partial granulation 05/18/2017 10:00 AM  Site / Wound Assessment Yellow 05/18/2017 10:00 AM  % Wound base Red or Granulating 10% 05/18/2017 10:00 AM  % Wound base Yellow/Fibrinous Exudate 90% 05/18/2017 10:00 AM  % Wound base Black/Eschar 0% 05/18/2017 10:00 AM  % Wound base Other/Granulation Tissue (Comment) 0% 05/18/2017 10:00 AM  Peri-wound Assessment Intact 05/18/2017 10:00 AM  Wound Length (cm) 10.5 cm 05/16/2017  4:00 PM  Wound Width (cm) 6 cm 05/16/2017  4:00 PM  Wound Depth (cm) 2 cm 05/16/2017  4:00 PM  Wound Surface Area (cm^2) 63 cm^2 05/16/2017  4:00 PM  Wound Volume (cm^3) 126 cm^3 05/16/2017  4:00 PM  Tunneling (cm) 3 cm 1 o'clock  05/16/2017  4:00 PM  Undermining (cm) depth of 1.5 cm from 6 - 8 o'clock , depth of 1 cm 12- 1 o'clock 05/16/2017  4:00 PM  Margins Unattached edges (unapproximated) 05/18/2017 10:00 AM  Drainage Amount Moderate 05/18/2017 10:00 AM  Drainage Description Purulent;Serous;Odor 05/18/2017 10:00 AM  Treatment Cleansed;Debridement  (Selective);Hydrotherapy (Pulse lavage);Packing (Saline gauze) 05/18/2017 10:00 AM   Hydrotherapy Pulsed lavage therapy - wound location: sacrum Pulsed Lavage with Suction (psi): 8 psi Pulsed Lavage with Suction - Normal Saline Used: 1000 mL Pulsed Lavage Tip: Tip with splash shield Selective Debridement Selective Debridement - Location: Sacrum Selective Debridement - Tools Used: Forceps;Scalpel;Scissors Selective Debridement - Tissue Removed: yellow necrotic tissue   Wound Assessment and Plan  Wound Therapy - Assess/Plan/Recommendations Wound Therapy - Clinical Statement: Infectious disease physician present during debridement and agrees with surgical consult due to the extensive nature of the wound. Pt with continued purulent drainage with strong odor.  Factors Delaying/Impairing Wound Healing: Diabetes Mellitus;Multiple medical problems;Immobility Hydrotherapy Plan: Debridement;Dressing change;Pulsatile lavage with suction;Patient/family education Wound Therapy - Frequency: 6X / week Wound Therapy - Current Recommendations: Surgery consult Wound Therapy - Follow Up Recommendations: Skilled nursing facility Wound Plan: see above  Wound Therapy Goals- Improve the function of patient's integumentary system by progressing the wound(s) through the phases of wound healing (inflammation - proliferation - remodeling) by: Decrease Necrotic Tissue to: 20 Decrease Necrotic Tissue - Progress: Not progressing Increase Granulation Tissue to: 80 Increase Granulation Tissue - Progress: Mot progressing Goals/treatment plan/discharge plan were made with and agreed upon by patient/family: Yes Time For Goal Achievement: 7 days Wound Therapy - Potential for Goals: Fair  Goals will be updated until maximal potential achieved or discharge criteria met.  Discharge criteria: when goals achieved, discharge from hospital, MD decision/surgical intervention, no progress towards goals, refusal/missing three  consecutive treatments without notification or medical reason.  GP  Dema Timmons B. [name], DPT Acute Rehabilitation  [phone] Pager [phone]   [place] 05/18/2017, 10:28 AM

## 2017-05-19 ENCOUNTER — Inpatient Hospital Stay (HOSPITAL_COMMUNITY): Payer: Medicaid Other

## 2017-05-19 DIAGNOSIS — M869 Osteomyelitis, unspecified: Secondary | ICD-10-CM | POA: Clinically undetermined

## 2017-05-19 LAB — GLUCOSE, CAPILLARY
GLUCOSE-CAPILLARY: 96 mg/dL (ref 65–99)
GLUCOSE-CAPILLARY: 113 mg/dL — AB (ref 65–99)
Glucose-Capillary: 170 mg/dL — ABNORMAL HIGH (ref 65–99)
Glucose-Capillary: 201 mg/dL — ABNORMAL HIGH (ref 65–99)
Glucose-Capillary: 226 mg/dL — ABNORMAL HIGH (ref 65–99)

## 2017-05-19 LAB — CULTURE, BLOOD (ROUTINE X 2)
CULTURE: NO GROWTH
Culture: NO GROWTH
SPECIAL REQUESTS: ADEQUATE
Special Requests: ADEQUATE

## 2017-05-19 LAB — CBC
HCT: 24.2 % — ABNORMAL LOW (ref 36.0–46.0)
Hemoglobin: 7.9 g/dL — ABNORMAL LOW (ref 12.0–15.0)
MCH: 28.2 pg (ref 26.0–34.0)
MCHC: 32.6 g/dL (ref 30.0–36.0)
MCV: 86.4 fL (ref 78.0–100.0)
Platelets: 405 10*3/uL — ABNORMAL HIGH (ref 150–400)
RBC: 2.8 MIL/uL — ABNORMAL LOW (ref 3.87–5.11)
RDW: 15 % (ref 11.5–15.5)
WBC: 28 10*3/uL — ABNORMAL HIGH (ref 4.0–10.5)

## 2017-05-19 LAB — BASIC METABOLIC PANEL
Anion gap: 10 (ref 5–15)
BUN: 15 mg/dL (ref 6–20)
CALCIUM: 8.3 mg/dL — AB (ref 8.9–10.3)
CO2: 19 mmol/L — ABNORMAL LOW (ref 22–32)
CREATININE: 1.18 mg/dL — AB (ref 0.44–1.00)
Chloride: 101 mmol/L (ref 101–111)
GFR calc non Af Amer: 53 mL/min — ABNORMAL LOW (ref >60)
Glucose, Bld: 111 mg/dL — ABNORMAL HIGH (ref 65–99)
Potassium: 5 mmol/L (ref 3.5–5.1)
SODIUM: 130 mmol/L — AB (ref 135–145)

## 2017-05-19 MED ORDER — HYDROMORPHONE HCL 1 MG/ML IJ SOLN: 0.5000 mg | INTRAMUSCULAR | Status: DC | PRN

## 2017-05-19 MED ORDER — OXYCODONE HCL 5 MG PO TABS
5.0000 mg | ORAL_TABLET | Freq: Four times a day (QID) | ORAL | Status: DC | PRN
  Administered 2017-05-19: 5 mg via ORAL
  Filled 2017-05-19: qty 1

## 2017-05-19 MED ORDER — GADOBENATE DIMEGLUMINE 529 MG/ML IV SOLN
11.0000 mL | Freq: Once | INTRAVENOUS | Status: AC | PRN
  Administered 2017-05-19: 11 mL via INTRAVENOUS

## 2017-05-19 MED ORDER — HYDROMORPHONE HCL 1 MG/ML IJ SOLN
0.5000 mg | INTRAMUSCULAR | Status: DC | PRN
  Administered 2017-05-19 – 2017-05-20 (×4): 1 mg via INTRAVENOUS
  Filled 2017-05-19 (×4): qty 1

## 2017-05-19 NOTE — Progress Notes (Signed)
   Subjective:  Patient continues to endorse sacral pain and bil LE pain.  Objective:  Vital signs in last 24 hours: Vitals:   05/18/17 1559 05/18/17 2043 05/19/17 0636 05/19/17 0900  BP: 126/62 108/75 (!) 131/55 138/60  Pulse: 76 78 74 77  Resp: 18 14 14 18   Temp: 98.4 F (36.9 C) 99 F (37.2 C) 99.5 F (37.5 C) 98.8 F (37.1 C)  TempSrc: Oral   Oral  SpO2: 100% 100% 100% 100%  Weight:   125 lb 10.6 oz (57 kg)   Height:       Constitutional: appears uncomfortable Resp: no increased work of breathing Ext: no swelling; pain with movement Sacrum: ~6x3cm open wound with ~1cm tracking through subcutaneous tissue  Assessment/Plan:  Active Problems:   Type 1 diabetes mellitus (HCC)   Multiple sclerosis (HCC)   Neurogenic bladder   Pressure injury of skin   Hyperglycemia   Bacteriuria   Bacteremia due to vancomycin resistant Enterococcus   Proteus infection   Foley catheter in place on admission  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 Leukocytosis stable at 28, no fevers. Surgery to look over MRI results. --ID following, appreciate recommendations --surgery following, appreciate recommendations --continue hydrotherapy --continue meropenem; if decompensates add daptomycin to cover for VRE --pain management with oxy IR and dilaudid for breakthrough pain with holding parameters  VRE and proteus bacteremia:Resolved.S/p 5 day of abx therapy with daptomycin and CTX>keflex. Repeat BCx have been neg x5d final. TTE neg for vegetations.   T1DM:   - Lantus 23 units qhs - Continue novolog 8 units tid, only if eats >50% of meal - SSI-S + CBG monitoring   MS: No evidence of flare currently. Will continue monitoring. - PT/OT for continued rehabilitation - Continue home muscle relaxants  Dispo: Anticipated discharge in approximately 5-6 day(s) pending surgical management .   Nyra Market, MD 05/19/2017, 12:24  PM Pager: [phone]

## 2017-05-19 NOTE — Progress Notes (Signed)
Physical Therapy Wound Treatment Patient Details  Name: Debra [name] MRN: [id number] Date of Birth: [birth year]  Today's Date: 05/19/2017 Time: 3668-1594 Time Calculation (min): 28 min  Subjective  Subjective: Pt reports she had MRI early this AM. Patient and Family Stated Goals: have wound heal and pain decrease  Pain Score:  Pt premedicated. Significant pain in legs with turning and positioning.  Wound Assessment  Pressure Injury 05/07/17 Unstageable - Full thickness tissue loss in which the base of the ulcer is covered by slough (yellow, tan, gray, green or brown) and/or eschar (tan, brown or black) in the wound bed. Sacral Wound to R of midline (Active)  Dressing Type ABD;Moist to dry;Barrier Film (skin prep) 05/19/2017 10:00 AM  Dressing Changed;Clean;Dry;Intact 05/19/2017 10:00 AM  Dressing Change Frequency Daily 05/19/2017 10:00 AM  State of Healing Early/partial granulation 05/19/2017 10:00 AM  Site / Wound Assessment Yellow 05/19/2017 10:00 AM  % Wound base Red or Granulating 10% 05/19/2017 10:00 AM  % Wound base Yellow/Fibrinous Exudate 90% 05/19/2017 10:00 AM  % Wound base Black/Eschar 0% 05/19/2017 10:00 AM  % Wound base Other/Granulation Tissue (Comment) 0% 05/19/2017 10:00 AM  Peri-wound Assessment Intact 05/19/2017 10:00 AM  Wound Length (cm) 10.5 cm 05/16/2017  4:00 PM  Wound Width (cm) 6 cm 05/16/2017  4:00 PM  Wound Depth (cm) 2 cm 05/16/2017  4:00 PM  Wound Surface Area (cm^2) 63 cm^2 05/16/2017  4:00 PM  Wound Volume (cm^3) 126 cm^3 05/16/2017  4:00 PM  Tunneling (cm) 3 cm 1 o'clock  05/16/2017  4:00 PM  Undermining (cm) depth of 1.5 cm from 6 - 8 o'clock , depth of 1 cm 12- 1 o'clock 05/16/2017  4:00 PM  Margins Unattached edges (unapproximated) 05/19/2017 10:00 AM  Drainage Amount Moderate 05/19/2017 10:00 AM  Drainage Description Purulent;Odor 05/19/2017 10:00 AM  Treatment Debridement (Selective);Hydrotherapy (Pulse lavage);Packing (Saline gauze) 05/19/2017 10:00 AM   Santyl applied to wound bed prior to applying dressing.  Hydrotherapy Pulsed lavage therapy - wound location: sacrum Pulsed Lavage with Suction (psi): 8 psi(8-12) Pulsed Lavage with Suction - Normal Saline Used: 1000 mL Pulsed Lavage Tip: Tip with splash shield Selective Debridement Selective Debridement - Location: Sacrum Selective Debridement - Tools Used: Forceps;Scalpel;Scissors Selective Debridement - Tissue Removed: yellow necrotic tissue   Wound Assessment and Plan  Wound Therapy - Assess/Plan/Recommendations Wound Therapy - Clinical Statement: Continue with removal of necrotic tissue. Surgical consult recommended continued use of Santyl. Factors Delaying/Impairing Wound Healing: Diabetes Mellitus;Multiple medical problems;Immobility Hydrotherapy Plan: Debridement;Dressing change;Pulsatile lavage with suction;Patient/family education Wound Therapy - Frequency: 6X / week Wound Therapy - Current Recommendations: Surgery consult Wound Therapy - Follow Up Recommendations: Skilled nursing facility Wound Plan: see above  Wound Therapy Goals- Improve the function of patient's integumentary system by progressing the wound(s) through the phases of wound healing (inflammation - proliferation - remodeling) by: Decrease Necrotic Tissue to: 20 Decrease Necrotic Tissue - Progress: Not progressing Increase Granulation Tissue to: 80 Increase Granulation Tissue - Progress: Mot progressing  Goals will be updated until maximal potential achieved or discharge criteria met.  Discharge criteria: when goals achieved, discharge from hospital, MD decision/surgical intervention, no progress towards goals, refusal/missing three consecutive treatments without notification or medical reason.  [place] 05/19/2017, 10:24 AM Suanne Marker PT [phone]

## 2017-05-19 NOTE — Progress Notes (Signed)
Patient had without images done of MRI, when I went to inject IV contrast, IV was blown.  IV team was called, after 30 minutes of patient yelling out in pain, patient stated she was in too much pain to continue to wait and wanted to go back to her room.  Informed RN that we would be sending her back, IV team was told to meet patient in her room.  Spoke with RN, at next round of pain medication later tonight or early morning, we will bring her back down to finish the exam.

## 2017-05-20 LAB — CBC
HCT: 22.7 % — ABNORMAL LOW (ref 36.0–46.0)
Hemoglobin: 7.4 g/dL — ABNORMAL LOW (ref 12.0–15.0)
MCH: 28.5 pg (ref 26.0–34.0)
MCHC: 32.6 g/dL (ref 30.0–36.0)
MCV: 87.3 fL (ref 78.0–100.0)
PLATELETS: 442 10*3/uL — AB (ref 150–400)
RBC: 2.6 MIL/uL — ABNORMAL LOW (ref 3.87–5.11)
RDW: 15.4 % (ref 11.5–15.5)
WBC: 25.2 10*3/uL — ABNORMAL HIGH (ref 4.0–10.5)

## 2017-05-20 LAB — BASIC METABOLIC PANEL
Anion gap: 10 (ref 5–15)
BUN: 14 mg/dL (ref 6–20)
CO2: 20 mmol/L — ABNORMAL LOW (ref 22–32)
Calcium: 8.6 mg/dL — ABNORMAL LOW (ref 8.9–10.3)
Chloride: 102 mmol/L (ref 101–111)
Creatinine, Ser: 1.22 mg/dL — ABNORMAL HIGH (ref 0.44–1.00)
GFR calc Af Amer: 59 mL/min — ABNORMAL LOW (ref >60)
GFR calc non Af Amer: 51 mL/min — ABNORMAL LOW (ref >60)
Glucose, Bld: 166 mg/dL — ABNORMAL HIGH (ref 65–99)
Potassium: 4.6 mmol/L (ref 3.5–5.1)
Sodium: 132 mmol/L — ABNORMAL LOW (ref 135–145)

## 2017-05-20 LAB — GLUCOSE, CAPILLARY
GLUCOSE-CAPILLARY: 171 mg/dL — AB (ref 65–99)
GLUCOSE-CAPILLARY: 134 mg/dL — AB (ref 65–99)
Glucose-Capillary: 108 mg/dL — ABNORMAL HIGH (ref 65–99)
Glucose-Capillary: 23 mg/dL — CL (ref 65–99)

## 2017-05-20 MED ORDER — HYDROMORPHONE HCL 1 MG/ML IJ SOLN
0.5000 mg | INTRAMUSCULAR | Status: DC | PRN
  Administered 2017-05-20: 1 mg via INTRAVENOUS
  Administered 2017-05-21: 0.5 mg via INTRAVENOUS
  Administered 2017-05-21 – 2017-05-22 (×8): 1 mg via INTRAVENOUS
  Administered 2017-05-23: 0.5 mg via INTRAVENOUS
  Administered 2017-05-23: 1 mg via INTRAVENOUS
  Administered 2017-05-23 (×2): 0.5 mg via INTRAVENOUS
  Administered 2017-05-24: 1 mg via INTRAVENOUS
  Administered 2017-05-24: 0.5 mg via INTRAVENOUS
  Administered 2017-05-24 – 2017-05-25 (×5): 1 mg via INTRAVENOUS
  Filled 2017-05-20 (×10): qty 1
  Filled 2017-05-20 (×2): qty 0.5
  Filled 2017-05-20: qty 1
  Filled 2017-05-20: qty 0.5
  Filled 2017-05-20 (×4): qty 1
  Filled 2017-05-20 (×2): qty 0.5
  Filled 2017-05-20: qty 1

## 2017-05-20 MED ORDER — OXYCODONE HCL 5 MG PO TABS
10.0000 mg | ORAL_TABLET | Freq: Four times a day (QID) | ORAL | Status: DC | PRN
  Administered 2017-05-20 – 2017-05-25 (×6): 10 mg via ORAL
  Filled 2017-05-20 (×7): qty 2

## 2017-05-20 MED ORDER — DEXTROSE 50 % IV SOLN
INTRAVENOUS | Status: AC
  Administered 2017-05-20: 50 mL
  Filled 2017-05-20: qty 50

## 2017-05-20 NOTE — H&P (View-Only) (Signed)
[facility]/Trauma Progress Note      Assessment/Plan Type I diabetes MS Bedbound Neurogenic bladder with chronic indwelling foley HTN CKD stage III  Sacral wound - MRI showed osteomyelitis and small abscess involving the posterior right gluteus maxiums muscle, R minimus and R coccygeus. Concern for early abscess formation within the R ischioanal fossa - will plan OR tomorrow   FEN: carb modified, NPO at midnight VTE: SCD's, lovenox ID: currently Meropenem 03/22>> Follow up: TBD  DISPO: OR tomorrow for debridement with Dr. Derrell Lolling    LOS: 15 days    Subjective: CC: sacral pain  Pain unchanged. No fevers, chills, nausea or vomiting. No new complaints. Is agreeable to surgery  Objective: Vital signs in last 24 hours: Temp:  [98.2 F (36.8 C)-100.9 F (38.3 C)] 98.9 F (37.2 C) (03/24 0505) Pulse Rate:  [76-80] 76 (03/24 0505) Resp:  [16-18] 16 (03/24 0505) BP: (116-128)/(45-56) 128/51 (03/24 0505) SpO2:  [99 %-100 %] 100 % (03/24 0505) Last BM Date: 05/18/17  Intake/Output from previous day: 03/23 0701 - 03/24 0700 In: 790 [P.O.:590; IV Piggyback:200] Out: 1375 [Urine:1375] Intake/Output this shift: No intake/output data recorded.  PE: Gen:  Alert, NAD, pleasant, cooperative Pulm:  Rate and effort normal GU: sacral wound with moderate slough, no foul odor Skin: no rashes noted, warm and dry   Anti-infectives: Anti-infectives (From admission, onward)   Start     Dose/Rate Route Frequency Ordered Stop   05/18/17 1030  meropenem (MERREM) 1 g in sodium chloride 0.9 % 100 mL IVPB     1 g 200 mL/hr over 30 Minutes Intravenous Every 12 hours 05/18/17 0955     05/08/17 2000  DAPTOmycin (CUBICIN) 600 mg in sodium chloride 0.9 % IVPB  Status:  Discontinued     600 mg 224 mL/hr over 30 Minutes Intravenous Every 24 hours 05/08/17 1049 05/10/17 1004   05/08/17 1200  cephALEXin (KEFLEX) capsule 250 mg  Status:  Discontinued     250 mg Oral Every 6  hours 05/08/17 1049 05/10/17 1004   05/06/17 0730  vancomycin (VANCOCIN) 500 mg in sodium chloride 0.9 % 100 mL IVPB  Status:  Discontinued     500 mg 100 mL/hr over 60 Minutes Intravenous Every 12 hours 05/05/17 1924 05/05/17 1956   05/05/17 2000  DAPTOmycin (CUBICIN) 600 mg in sodium chloride 0.9 % IVPB  Status:  Discontinued     600 mg 224 mL/hr over 30 Minutes Intravenous Every 24 hours 05/05/17 1956 05/08/17 1032   05/05/17 1930  cefTRIAXone (ROCEPHIN) 2 g in sodium chloride 0.9 % 100 mL IVPB  Status:  Discontinued     2 g 200 mL/hr over 30 Minutes Intravenous Every 24 hours 05/05/17 1922 05/08/17 1032   05/05/17 1930  vancomycin (VANCOCIN) IVPB 1000 mg/200 mL premix  Status:  Discontinued     1,000 mg 200 mL/hr over 60 Minutes Intravenous  Once 05/05/17 1924 05/05/17 1956      Lab Results:  Recent Labs    05/19/17 0822 05/20/17 0716  WBC 28.0* 25.2*  HGB 7.9* 7.4*  HCT 24.2* 22.7*  PLT 405* 442*   BMET Recent Labs    05/19/17 0822 05/20/17 0716  NA 130* 132*  K 5.0 4.6  CL 101 102  CO2 19* 20*  GLUCOSE 111* 166*  BUN 15 14  CREATININE 1.18* 1.22*  CALCIUM 8.3* 8.6*   PT/INR No results for input(s): LABPROT, INR in the last 72 hours. CMP     Component Value  Date/Time   NA 132 (L) 05/20/2017 0716   NA 135 04/21/2015 0956   K 4.6 05/20/2017 0716   CL 102 05/20/2017 0716   CO2 20 (L) 05/20/2017 0716   GLUCOSE 166 (H) 05/20/2017 0716   BUN 14 05/20/2017 0716   BUN 11 04/21/2015 0956   CREATININE 1.22 (H) 05/20/2017 0716   CREATININE 0.79 08/28/2014 1223   CALCIUM 8.6 (L) 05/20/2017 0716   PROT 5.7 (L) 05/05/2017 0220   PROT 6.4 04/21/2015 0956   ALBUMIN 2.2 (L) 05/05/2017 0220   ALBUMIN 3.6 04/21/2015 0956   AST 14 (L) 05/05/2017 0220   ALT 12 (L) 05/05/2017 0220   ALKPHOS 92 05/05/2017 0220   BILITOT 0.4 05/05/2017 0220   BILITOT <0.2 04/21/2015 0956   GFRNONAA 51 (L) 05/20/2017 0716   GFRNONAA 89 08/28/2014 1223   GFRAA 59 (L) 05/20/2017 0716    GFRAA >89 08/28/2014 1223   Lipase  No results found for: LIPASE  Studies/Results: Mr Pelvis W Wo Contrast  Result Date: 05/19/2017 CLINICAL DATA:  Sacral decubitus ulcer.  Evaluate for osteomyelitis. EXAM: MRI PELVIS WITHOUT AND WITH CONTRAST TECHNIQUE: Multiplanar multisequence MR imaging of the pelvis was performed both before and after administration of intravenous contrast. CONTRAST:  83[facility]ille[facility]yler [facility] AriTTyler 67mhWhittTyler 58mhMccamey HospitWille CTyroTyler 52mhWTyler 4mhSkyline AmbulTyler 52mhDoctorTyler 25mhBreTyler 21mhSelect Specialty HospitTyler 23mhHealtheast Woodwinds HospitWillTyler 87mh[facility] MeTylerTyler 102mhMarian [facility] CTyler 30mhTrumaTTyler 15mhAmbulatory Surgical Center Of SteTyler 1Tyler 19mhEndoscopyTyler 67mhJackson[facility] End[facility] Centers LLC Dba JacksonvTyler 33mhStTyler 28mhHoopeston Community Memorial HospitWille CTyronTyler 60mhWest [place] RTyler ChuObie DredgeonspitWille CTyroneQuitman County HospitMayo Clinic [facility] [place], 2019. FINDINGS: Bones/Joint/Cartilage Marrow edema and enhancement with decreased T1 marrow signal in the proximal coccyx, consistent with osteomyelitis. No other suspicious marrow signal abnormality. No fracture or dislocation. Normal alignment. Trace left hip joint effusion. Muscles and Tendons Scattered edema and atrophy involving the bilateral gluteal, obturator, and adductor muscles, greatest in the right gluteus maximus. There is enhancement of the posterior right gluteus maximus muscle containing an ill-defined, rim enhancing 1.3 x 4.0 x 2.7 cm fluid collection with small foci of gas, consistent with abscess. There is a small 2.5 x 1.2 x 2.5 cm rim enhancing fluid collection along the right coccygeus muscle, also consistent with abscess. There is also a 3.0 x 1.0 x 2.7 cm rim enhancing fluid collection in right gluteus minimus muscle. There is mild enhancement along the distal right gluteus medius muscle without focal fluid collection. Edema within the right piriformis muscle. The bilateral gluteal, iliopsoas, and hamstring tendons are intact. Soft tissue Right paramidline sacral decubitus ulcer extending to the coccyx. Ill-defined, somewhat loculated rim enhancing phlegmon within the right ischioanal fossa. No greater trochanteric bursitis. Foley catheter within the decompressed bladder. Small amount of free fluid in the pelvis. IMPRESSION: 1. Right paramidline sacral decubitus ulcer extending to the coccyx with  underlying osteomyelitis of the C1 and C2 segments. 2. Infectious myositis and small abscesses involving the posterior right gluteus maximus muscle, right gluteus minimus muscle, and right coccygeus muscle as described above. 3. Ill-defined, somewhat loculated rim enhancing phlegmon and early abscess formation within the right ischioanal fossa. Electronically Signed   By: William T Derry M.D.   On: 05/19/2017 10:12      Adebayo Ensminger L Kalyiah Saintil , PA-C [facility] 05/20/2017, 9:29 AM  Pager: [phone] Mon-Wed, Friday 7:00am-4:30pm Thurs 7am-11:30am  Consults: [phone]

## 2017-05-20 NOTE — Progress Notes (Signed)
   Subjective:  Patient continues to endorse sacral pain. She denies chest pain, shortness of breath, abd pain, fevers, chills.  Objective:  Vital signs in last 24 hours: Vitals:   05/19/17 1721 05/19/17 2028 05/20/17 0505 05/20/17 0900  BP: (!) 128/56 (!) 116/45 (!) 128/51 (!) 122/56  Pulse: 78 80 76 82  Resp: 18 18 16 16   Temp: 98.2 F (36.8 C) (!) 100.9 F (38.3 C) 98.9 F (37.2 C) 98.8 F (37.1 C)  TempSrc: Oral Oral Oral Oral  SpO2: 100% 99% 100% 100%  Weight:      Height:       Constitutional: appears uncomfortable CV: RRR, systolic murmur, no rubs or gallops Resp: no increased work of breathing, CTAB on anterior lung fields Ext: no swelling; pain with movement  Assessment/Plan:  Active Problems:   Type 1 diabetes mellitus (HCC)   Multiple sclerosis (HCC)   Neurogenic bladder   Pressure injury of skin   Hyperglycemia   Bacteriuria   Bacteremia due to vancomycin resistant Enterococcus   Proteus infection   Foley catheter in place on admission   Osteomyelitis of coccyx (HCC)  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 She is going to the OR tomorrow morning for debridement. --ID following, appreciate recommendations --surgery following, OR tomorrow AM --continue hydrotherapy --continue meropenem; if decompensates add daptomycin to cover for VRE --pain management with oxy IR and dilaudid for breakthrough pain with holding parameters; increased dose and frequency today as inadequate pain control  VRE and proteus bacteremia:Resolved.S/p 5 day of abx therapy with daptomycin and CTX>keflex. Repeat BCx have been neg x5d final. TTE neg for vegetations.   T1DM:   - Lantus 23 units qhs - Continue novolog 8 units tid, only if eats >50% of meal - SSI-S + CBG monitoring   MS: No evidence of flare currently. Will continue monitoring. - PT/OT for continued rehabilitation - Continue home muscle  relaxants  Dispo: Anticipated discharge in approximately 5-6 day(s) pending surgical management .   Nyra Market, MD 05/20/2017, 1:29 PM Pager: [phone]

## 2017-05-20 NOTE — Progress Notes (Signed)
[facility]/Trauma Progress Note      Assessment/Plan Type I diabetes MS Bedbound Neurogenic bladder with chronic indwelling foley HTN CKD stage III  Sacral wound - MRI showed osteomyelitis and small abscess involving the posterior right gluteus maxiums muscle, R minimus and R coccygeus. Concern for early abscess formation within the R ischioanal fossa - will plan OR tomorrow   FEN: carb modified, NPO at midnight VTE: SCD's, lovenox ID: currently Meropenem 03/22>> Follow up: TBD  DISPO: OR tomorrow for debridement with Dr. Derrell Lolling    LOS: 15 days    Subjective: CC: sacral pain  Pain unchanged. No fevers, chills, nausea or vomiting. No new complaints. Is agreeable to surgery  Objective: Vital signs in last 24 hours: Temp:  [98.2 F (36.8 C)-100.9 F (38.3 C)] 98.9 F (37.2 C) (03/24 0505) Pulse Rate:  [76-80] 76 (03/24 0505) Resp:  [16-18] 16 (03/24 0505) BP: (116-128)/(45-56) 128/51 (03/24 0505) SpO2:  [99 %-100 %] 100 % (03/24 0505) Last BM Date: 05/18/17  Intake/Output from previous day: 03/23 0701 - 03/24 0700 In: 790 [P.O.:590; IV Piggyback:200] Out: 1375 [Urine:1375] Intake/Output this shift: No intake/output data recorded.  PE: Gen:  Alert, NAD, pleasant, cooperative Pulm:  Rate and effort normal GU: sacral wound with moderate slough, no foul odor Skin: no rashes noted, warm and dry   Anti-infectives: Anti-infectives (From admission, onward)   Start     Dose/Rate Route Frequency Ordered Stop   05/18/17 1030  meropenem (MERREM) 1 g in sodium chloride 0.9 % 100 mL IVPB     1 g 200 mL/hr over 30 Minutes Intravenous Every 12 hours 05/18/17 0955     05/08/17 2000  DAPTOmycin (CUBICIN) 600 mg in sodium chloride 0.9 % IVPB  Status:  Discontinued     600 mg 224 mL/hr over 30 Minutes Intravenous Every 24 hours 05/08/17 1049 05/10/17 1004   05/08/17 1200  cephALEXin (KEFLEX) capsule 250 mg  Status:  Discontinued     250 mg Oral Every 6  hours 05/08/17 1049 05/10/17 1004   05/06/17 0730  vancomycin (VANCOCIN) 500 mg in sodium chloride 0.9 % 100 mL IVPB  Status:  Discontinued     500 mg 100 mL/hr over 60 Minutes Intravenous Every 12 hours 05/05/17 1924 05/05/17 1956   05/05/17 2000  DAPTOmycin (CUBICIN) 600 mg in sodium chloride 0.9 % IVPB  Status:  Discontinued     600 mg 224 mL/hr over 30 Minutes Intravenous Every 24 hours 05/05/17 1956 05/08/17 1032   05/05/17 1930  cefTRIAXone (ROCEPHIN) 2 g in sodium chloride 0.9 % 100 mL IVPB  Status:  Discontinued     2 g 200 mL/hr over 30 Minutes Intravenous Every 24 hours 05/05/17 1922 05/08/17 1032   05/05/17 1930  vancomycin (VANCOCIN) IVPB 1000 mg/200 mL premix  Status:  Discontinued     1,000 mg 200 mL/hr over 60 Minutes Intravenous  Once 05/05/17 1924 05/05/17 1956      Lab Results:  Recent Labs    05/19/17 0822 05/20/17 0716  WBC 28.0* 25.2*  HGB 7.9* 7.4*  HCT 24.2* 22.7*  PLT 405* 442*   BMET Recent Labs    05/19/17 0822 05/20/17 0716  NA 130* 132*  K 5.0 4.6  CL 101 102  CO2 19* 20*  GLUCOSE 111* 166*  BUN 15 14  CREATININE 1.18* 1.22*  CALCIUM 8.3* 8.6*   PT/INR No results for input(s): LABPROT, INR in the last 72 hours. CMP     Component Value  Date/Time   NA 132 (L) 05/20/2017 0716   NA 135 04/21/2015 0956   K 4.6 05/20/2017 0716   CL 102 05/20/2017 0716   CO2 20 (L) 05/20/2017 0716   GLUCOSE 166 (H) 05/20/2017 0716   BUN 14 05/20/2017 0716   BUN 11 04/21/2015 0956   CREATININE 1.22 (H) 05/20/2017 0716   CREATININE 0.79 08/28/2014 1223   CALCIUM 8.6 (L) 05/20/2017 0716   PROT 5.7 (L) 05/05/2017 0220   PROT 6.4 04/21/2015 0956   ALBUMIN 2.2 (L) 05/05/2017 0220   ALBUMIN 3.6 04/21/2015 0956   AST 14 (L) 05/05/2017 0220   ALT 12 (L) 05/05/2017 0220   ALKPHOS 92 05/05/2017 0220   BILITOT 0.4 05/05/2017 0220   BILITOT <0.2 04/21/2015 0956   GFRNONAA 51 (L) 05/20/2017 0716   GFRNONAA 89 08/28/2014 1223   GFRAA 59 (L) 05/20/2017 0716    GFRAA >89 08/28/2014 1223   Lipase  No results found for: LIPASE  Studies/Results: Mr Pelvis W Wo Contrast  Result Date: 05/19/2017 CLINICAL DATA:  Sacral decubitus ulcer.  Evaluate for osteomyelitis. EXAM: MRI PELVIS WITHOUT AND WITH CONTRAST TECHNIQUE: Multiplanar multisequence MR imaging of the pelvis was performed both before and after administration of intravenous contrast. CONTRAST:  83[facility] Cent[facility]illeTyler 43[facility] AriTTyler 67mhWhittTyler 58mhMccamey HospitWille CTyroTyler 52mhWTyler 4mhSkyline AmbulTyler 52mhDoctorTyler 25mhBreTyler 21mhSelect Specialty HospitTyler 23mhHealtheast Woodwinds HospitWillTyler 87mh[facility] MeTylerTyler 102mhM[facility] CTyler 30mhTrumaTTyler 15mhAmbulatory Surgical Center Of SteTyler 1Tyler 19mhEndoscopyTyler 67mhJacksonville Endoscopy Centers LLC Dba JacksonvTyler 33mhStTyler 28mhHoopeston Community Memorial HospitWille CTyronTyler 60mhWest [place] RTyler ChuObie DredgeonspitWille CTyroneQuitman County HospitMayo Clinic [facility] System [place], 2019. FINDINGS: Bones/Joint/Cartilage Marrow edema and enhancement with decreased T1 marrow signal in the proximal coccyx, consistent with osteomyelitis. No other suspicious marrow signal abnormality. No fracture or dislocation. Normal alignment. Trace left hip joint effusion. Muscles and Tendons Scattered edema and atrophy involving the bilateral gluteal, obturator, and adductor muscles, greatest in the right gluteus maximus. There is enhancement of the posterior right gluteus maximus muscle containing an ill-defined, rim enhancing 1.3 x 4.0 x 2.7 cm fluid collection with small foci of gas, consistent with abscess. There is a small 2.5 x 1.2 x 2.5 cm rim enhancing fluid collection along the right coccygeus muscle, also consistent with abscess. There is also a 3.0 x 1.0 x 2.7 cm rim enhancing fluid collection in right gluteus minimus muscle. There is mild enhancement along the distal right gluteus medius muscle without focal fluid collection. Edema within the right piriformis muscle. The bilateral gluteal, iliopsoas, and hamstring tendons are intact. Soft tissue Right paramidline sacral decubitus ulcer extending to the coccyx. Ill-defined, somewhat loculated rim enhancing phlegmon within the right ischioanal fossa. No greater trochanteric bursitis. Foley catheter within the decompressed bladder. Small amount of free fluid in the pelvis. IMPRESSION: 1. Right paramidline sacral decubitus ulcer extending to the coccyx with  underlying osteomyelitis of the C1 and C2 segments. 2. Infectious myositis and small abscesses involving the posterior right gluteus maximus muscle, right gluteus minimus muscle, and right coccygeus muscle as described above. 3. Ill-defined, somewhat loculated rim enhancing phlegmon and early abscess formation within the right ischioanal fossa. Electronically Signed   By: William T Derry M.D.   On: 05/19/2017 10:12      Arbor Leer L Rick Warnick , PA-C [facility] 05/20/2017, 9:29 AM  Pager: [phone] Mon-Wed, Friday 7:00am-4:30pm Thurs 7am-11:30am  Consults: [phone]

## 2017-05-21 ENCOUNTER — Inpatient Hospital Stay (HOSPITAL_COMMUNITY): Payer: Medicaid Other | Admitting: Anesthesiology

## 2017-05-21 ENCOUNTER — Encounter (HOSPITAL_COMMUNITY): Payer: Self-pay | Admitting: Certified Registered"

## 2017-05-21 ENCOUNTER — Encounter (HOSPITAL_COMMUNITY)
Admission: EM | Disposition: A | Discharge status: Skilled Nursing Facility | Payer: Self-pay | Source: Home / Self Care | Attending: Internal Medicine

## 2017-05-21 DIAGNOSIS — L89154 Pressure ulcer of sacral region, stage 4: Secondary | ICD-10-CM | POA: Diagnosis present

## 2017-05-21 DIAGNOSIS — E1069 Type 1 diabetes mellitus with other specified complication: Secondary | ICD-10-CM

## 2017-05-21 HISTORY — PX: DEBRIDMENT OF DECUBITUS ULCER: SHX6276

## 2017-05-21 LAB — GLUCOSE, CAPILLARY
GLUCOSE-CAPILLARY: 119 mg/dL — AB (ref 65–99)
GLUCOSE-CAPILLARY: 123 mg/dL — AB (ref 65–99)
GLUCOSE-CAPILLARY: 90 mg/dL (ref 65–99)
Glucose-Capillary: 101 mg/dL — ABNORMAL HIGH (ref 65–99)
Glucose-Capillary: 106 mg/dL — ABNORMAL HIGH (ref 65–99)
Glucose-Capillary: 100 mg/dL — ABNORMAL HIGH (ref 65–99)
Glucose-Capillary: 114 mg/dL — ABNORMAL HIGH (ref 65–99)
Glucose-Capillary: 124 mg/dL — ABNORMAL HIGH (ref 65–99)

## 2017-05-21 LAB — SURGICAL PCR SCREEN
MRSA, PCR: NEGATIVE
Staphylococcus aureus: NEGATIVE

## 2017-05-21 SURGERY — DEBRIDMENT OF DECUBITUS ULCER
Anesthesia: General | Site: Buttocks

## 2017-05-21 MED ORDER — PROPOFOL 10 MG/ML IV BOLUS
INTRAVENOUS | Status: AC
  Filled 2017-05-21: qty 20

## 2017-05-21 MED ORDER — ACETAMINOPHEN 325 MG PO TABS: 650.0000 mg | ORAL_TABLET | ORAL | Status: DC | PRN

## 2017-05-21 MED ORDER — ZOLPIDEM TARTRATE 5 MG PO TABS: 5.0000 mg | ORAL_TABLET | Freq: Every evening | ORAL | Status: DC | PRN

## 2017-05-21 MED ORDER — HEPARIN SODIUM (PORCINE) 5000 UNIT/ML IJ SOLN
5000.0000 [IU] | Freq: Three times a day (TID) | INTRAMUSCULAR | Status: DC
  Administered 2017-05-21 – 2017-05-25 (×13): 5000 [IU] via SUBCUTANEOUS
  Filled 2017-05-21 (×12): qty 1

## 2017-05-21 MED ORDER — INSULIN GLARGINE 100 UNIT/ML ~~LOC~~ SOLN
23.0000 [IU] | Freq: Every day | SUBCUTANEOUS | Status: DC
  Administered 2017-05-21: 23 [IU] via SUBCUTANEOUS
  Filled 2017-05-21: qty 0.23

## 2017-05-21 MED ORDER — OXYCODONE HCL 5 MG PO TABS: 5.0000 mg | ORAL_TABLET | Freq: Once | ORAL | Status: DC | PRN

## 2017-05-21 MED ORDER — PROMETHAZINE HCL 25 MG/ML IJ SOLN: 6.2500 mg | INTRAMUSCULAR | Status: DC | PRN

## 2017-05-21 MED ORDER — FENTANYL CITRATE (PF) 100 MCG/2ML IJ SOLN
INTRAMUSCULAR | Status: AC
  Filled 2017-05-21: qty 2

## 2017-05-21 MED ORDER — MAGNESIUM HYDROXIDE 400 MG/5ML PO SUSP: 30.0000 mL | Freq: Every day | ORAL | Status: DC | PRN

## 2017-05-21 MED ORDER — SODIUM CHLORIDE 0.9 % IV SOLN
INTRAVENOUS | Status: DC
  Administered 2017-05-21 – 2017-05-25 (×6): via INTRAVENOUS

## 2017-05-21 MED ORDER — ROCURONIUM BROMIDE 100 MG/10ML IV SOLN
INTRAVENOUS | Status: DC | PRN
  Administered 2017-05-21: 40 mg via INTRAVENOUS

## 2017-05-21 MED ORDER — PROPOFOL 10 MG/ML IV BOLUS
INTRAVENOUS | Status: DC | PRN
  Administered 2017-05-21: 150 mg via INTRAVENOUS

## 2017-05-21 MED ORDER — INSULIN ASPART 100 UNIT/ML ~~LOC~~ SOLN
2.0000 [IU] | Freq: Three times a day (TID) | SUBCUTANEOUS | Status: DC
  Administered 2017-05-22 – 2017-05-24 (×2): 2 [IU] via SUBCUTANEOUS

## 2017-05-21 MED ORDER — FENTANYL CITRATE (PF) 100 MCG/2ML IJ SOLN
INTRAMUSCULAR | Status: DC | PRN
  Administered 2017-05-21 (×3): 50 ug via INTRAVENOUS

## 2017-05-21 MED ORDER — ASPIRIN EC 81 MG PO TBEC
81.0000 mg | DELAYED_RELEASE_TABLET | Freq: Every day | ORAL | Status: DC
  Administered 2017-05-21 – 2017-05-25 (×5): 81 mg via ORAL
  Filled 2017-05-21 (×5): qty 1

## 2017-05-21 MED ORDER — FENTANYL CITRATE (PF) 250 MCG/5ML IJ SOLN
INTRAMUSCULAR | Status: AC
Start: 2017-05-21 — End: 2017-05-21
  Filled 2017-05-21: qty 5

## 2017-05-21 MED ORDER — PANTOPRAZOLE SODIUM 40 MG PO TBEC
40.0000 mg | DELAYED_RELEASE_TABLET | Freq: Every day | ORAL | Status: DC
  Administered 2017-05-21 – 2017-05-25 (×5): 40 mg via ORAL
  Filled 2017-05-21 (×5): qty 1

## 2017-05-21 MED ORDER — 0.9 % SODIUM CHLORIDE (POUR BTL) OPTIME
TOPICAL | Status: DC | PRN
  Administered 2017-05-21: 1000 mL

## 2017-05-21 MED ORDER — POLYETHYLENE GLYCOL 3350 17 G PO PACK: 17.0000 g | PACK | Freq: Every day | ORAL | Status: DC | PRN

## 2017-05-21 MED ORDER — MIDAZOLAM HCL 2 MG/2ML IJ SOLN
INTRAMUSCULAR | Status: AC
  Filled 2017-05-21: qty 2

## 2017-05-21 MED ORDER — MIRTAZAPINE 15 MG PO TABS
45.0000 mg | ORAL_TABLET | Freq: Every day | ORAL | Status: DC
  Administered 2017-05-21 – 2017-05-24 (×4): 45 mg via ORAL
  Filled 2017-05-21 (×4): qty 3

## 2017-05-21 MED ORDER — LACTATED RINGERS IV SOLN
INTRAVENOUS | Status: DC
  Administered 2017-05-21: 09:00:00 via INTRAVENOUS

## 2017-05-21 MED ORDER — ONDANSETRON HCL 4 MG/2ML IJ SOLN
INTRAMUSCULAR | Status: DC | PRN
  Administered 2017-05-21: 4 mg via INTRAVENOUS

## 2017-05-21 MED ORDER — ONDANSETRON HCL 4 MG PO TABS
4.0000 mg | ORAL_TABLET | Freq: Every day | ORAL | Status: DC
  Administered 2017-05-21 – 2017-05-25 (×5): 4 mg via ORAL
  Filled 2017-05-21 (×5): qty 1

## 2017-05-21 MED ORDER — SUGAMMADEX SODIUM 200 MG/2ML IV SOLN
INTRAVENOUS | Status: DC | PRN
  Administered 2017-05-21: 200 mg via INTRAVENOUS

## 2017-05-21 MED ORDER — LIDOCAINE HCL (CARDIAC) 20 MG/ML IV SOLN
INTRAVENOUS | Status: DC | PRN
  Administered 2017-05-21: 60 mg via INTRAVENOUS

## 2017-05-21 MED ORDER — OXYCODONE HCL 5 MG/5ML PO SOLN: 5.0000 mg | Freq: Once | ORAL | Status: DC | PRN

## 2017-05-21 MED ORDER — FENTANYL CITRATE (PF) 100 MCG/2ML IJ SOLN
25.0000 ug | INTRAMUSCULAR | Status: DC | PRN
  Administered 2017-05-21 (×2): 50 ug via INTRAVENOUS

## 2017-05-21 MED ORDER — ATORVASTATIN CALCIUM 40 MG PO TABS
40.0000 mg | ORAL_TABLET | Freq: Every day | ORAL | Status: DC
  Administered 2017-05-21 – 2017-05-25 (×5): 40 mg via ORAL
  Filled 2017-05-21 (×5): qty 1

## 2017-05-21 MED ORDER — MECLIZINE HCL 25 MG PO TABS
12.5000 mg | ORAL_TABLET | Freq: Every day | ORAL | Status: DC | PRN
Start: 2017-05-21 — End: 2017-05-25

## 2017-05-21 MED ORDER — LOPERAMIDE HCL 2 MG PO CAPS
4.0000 mg | ORAL_CAPSULE | ORAL | Status: DC | PRN
Start: 2017-05-21 — End: 2017-05-25

## 2017-05-21 SURGICAL SUPPLY — 41 items
APL SKNCLS STERI-STRIP NONHPOA (GAUZE/BANDAGES/DRESSINGS) ×1
BENZOIN TINCTURE PRP APPL 2/3 (GAUZE/BANDAGES/DRESSINGS) ×3 IMPLANT
BLADE CLIPPER SURG (BLADE) IMPLANT
BNDG GAUZE ELAST 4 BULKY (GAUZE/BANDAGES/DRESSINGS) ×2 IMPLANT
CANISTER SUCT 3000ML PPV (MISCELLANEOUS) ×3 IMPLANT
COVER SURGICAL LIGHT HANDLE (MISCELLANEOUS) ×3 IMPLANT
DRAIN PENROSE 1/2X12 LTX STRL (WOUND CARE) ×2 IMPLANT
DRAPE LAPAROTOMY T 102X78X121 (DRAPES) ×3 IMPLANT
DRAPE UTILITY XL STRL (DRAPES) ×2 IMPLANT
ELECT CAUTERY BLADE 6.4 (BLADE) ×3 IMPLANT
ELECT REM PT RETURN 9FT ADLT (ELECTROSURGICAL) ×3
ELECTRODE REM PT RTRN 9FT ADLT (ELECTROSURGICAL) ×1 IMPLANT
GAUZE SPONGE 4X4 12PLY STRL (GAUZE/BANDAGES/DRESSINGS) IMPLANT
GAUZE SPONGE 4X4 12PLY STRL LF (GAUZE/BANDAGES/DRESSINGS) ×2 IMPLANT
GLOVE BIO SURGEON STRL SZ7.5 (GLOVE) ×4 IMPLANT
GLOVE BIOGEL PI IND STRL 7.5 (GLOVE) IMPLANT
GLOVE BIOGEL PI INDICATOR 7.5 (GLOVE) ×4
GLOVE EUDERMIC 7 POWDERFREE (GLOVE) ×3 IMPLANT
GOWN STRL REUS W/ TWL LRG LVL3 (GOWN DISPOSABLE) ×2 IMPLANT
GOWN STRL REUS W/TWL LRG LVL3 (GOWN DISPOSABLE) ×9
KIT BASIN OR (CUSTOM PROCEDURE TRAY) ×3 IMPLANT
KIT ROOM TURNOVER OR (KITS) ×3 IMPLANT
NDL HYPO 25GX1X1/2 BEV (NEEDLE) ×1 IMPLANT
NEEDLE HYPO 25GX1X1/2 BEV (NEEDLE) IMPLANT
NS IRRIG 1000ML POUR BTL (IV SOLUTION) ×3 IMPLANT
PACK GENERAL/GYN (CUSTOM PROCEDURE TRAY) ×2 IMPLANT
PACK SURGICAL SETUP 50X90 (CUSTOM PROCEDURE TRAY) ×3 IMPLANT
PAD ABD 8X10 STRL (GAUZE/BANDAGES/DRESSINGS) ×4 IMPLANT
PAD ARMBOARD 7.5X6 YLW CONV (MISCELLANEOUS) ×6 IMPLANT
PENCIL BUTTON HOLSTER BLD 10FT (ELECTRODE) ×1 IMPLANT
SPECIMEN JAR SMALL (MISCELLANEOUS) ×1 IMPLANT
SPONGE LAP 18X18 X RAY DECT (DISPOSABLE) ×1 IMPLANT
SUT ETHILON 2 0 FS 18 (SUTURE) ×2 IMPLANT
SWAB COLLECTION DEVICE MRSA (MISCELLANEOUS) ×2 IMPLANT
SWAB CULTURE ESWAB REG 1ML (MISCELLANEOUS) ×2 IMPLANT
SYR CONTROL 10ML LL (SYRINGE) ×1 IMPLANT
TOWEL OR 17X24 6PK STRL BLUE (TOWEL DISPOSABLE) ×3 IMPLANT
TOWEL OR 17X26 10 PK STRL BLUE (TOWEL DISPOSABLE) ×1 IMPLANT
TUBE CONNECTING 12'X1/4 (SUCTIONS)
TUBE CONNECTING 12X1/4 (SUCTIONS) ×1 IMPLANT
YANKAUER SUCT BULB TIP NO VENT (SUCTIONS) ×1 IMPLANT

## 2017-05-21 NOTE — Progress Notes (Signed)
Inpatient Diabetes Program Recommendations  AACE/ADA: New Consensus Statement on Inpatient Glycemic Control (2015)  Target Ranges:  Prepandial:   less than 140 mg/dL      Peak postprandial:   less than 180 mg/dL (1-2 hours)      Critically ill patients:  140 - 180 mg/dL   Lab Results  Component Value Date   GLUCAP 106 (H) 05/21/2017   HGBA1C 10.3 (H) 04/19/2017    Review of Glycemic Control Results for Debra [name], Debra [name] (MRN [id number]) as of 05/21/2017 09:58  Ref. Range 05/20/2017 16:18 05/20/2017 21:43 05/20/2017 22:24 05/21/2017 01:06  Glucose-Capillary Latest Ref Range: 65 - 99 mg/dL 601 (H) 23 (LL) 093 (H) 101 (H)   Diabetes history: Type 1 DM Outpatient Diabetes medications: Lantus 23 units QD, Novolog 2-10 units TIDAC Current orders for Inpatient glycemic control: Novolog 0-9 units TID, Novolog 8 units TIDAC, Lantus 23 units QHS.   Inpatient Diabetes Program Recommendations:    On 3/24- Patient received Novolog 8 units of MEAL Coverage at 0914, 1248, and 1844. Respectively, it was documented that patient consumed 10% at 0914, 35% at 1248, and nothing was charted for a meal at 1844. Given the meal coverage order in the [facility], the patient was to consume a minimum of 50% of the meal and from what was charted the patient had poor oral intake.  Consequently, patient experienced a low blood sugar of 23 mg/dL at 2355 and had to received D50 at 2150. Patient received an additional 24 units of Novolog that was unnecessary.   At this time, no recommendations. Will continue to follow.    Thanks, Lujean Rave, MSN, RNC-OB Diabetes Coordinator [phone] (8a-5p)

## 2017-05-21 NOTE — Progress Notes (Signed)
Hydrotherapy Cancellation Note  Patient Details Name: Debra [name] MRN: [id number] DOB: 10-14-1965   Cancelled Treatment:    Reason Eval/Treat Not Completed: (P) Patient at procedure or test/unavailable Pt undergoing surgical I&D. Will resume hydrotherapy 3/26.  Debra [name] B. Beverely Risen PT, DPT Acute Rehabilitation  [phone] Pager [phone]     Elon Alas [facility] 05/21/2017, 8:44 AM

## 2017-05-21 NOTE — Progress Notes (Signed)
PT Cancellation Note  Patient Details Name: Debra [name] MRN: [id number] DOB: 1965-05-17   Cancelled Treatment:    Reason Eval/Treat Not Completed: Patient at procedure or test/unavailable   Fabio Asa 05/21/2017, 8:51 AM Charlotte Crumb, PT DPT  Board Certified Neurologic Specialist [phone]

## 2017-05-21 NOTE — Progress Notes (Signed)
Patient ID: Debra [name], female   DOB: 01/19/1966, 52 y.o.   MRN: [id number]          [facility]    Date of Admission:  05/05/2017   Day 4 meropenem         She underwent incision and drainage of her sacral decubitus.  An abscess was encountered and drained.  Abscess Gram stain shows gram-positive cocci.  I will continue meropenem for now.  If she shows any decompensation I would have a low threshold for adding vancomycin pending final cultures.         Cliffton Asters, MD [facility] [phone] pager   [phone] cell 05/21/2017, 3:03 PM

## 2017-05-21 NOTE — Progress Notes (Signed)
Dentures given to Kami, patient's nurse.

## 2017-05-21 NOTE — Transfer of Care (Signed)
Immediate Anesthesia Transfer of Care Note  Patient: Debra [name]  Procedure(s) Performed: DEBRIDMENT OF SACRAL DECUBITUS ULCER (N/A Buttocks)  Patient Location: PACU  Anesthesia Type:General  Level of Consciousness: awake, alert  and oriented  Airway & Oxygen Therapy: Patient Spontanous Breathing  Post-op Assessment: Report given to RN and Post -op Vital signs reviewed and stable  Post vital signs: Reviewed and stable  Last Vitals:  Vitals Value Taken Time  BP 115/51 05/21/2017 11:00 AM  Temp    Pulse 67 05/21/2017 11:03 AM  Resp 3 05/21/2017 11:03 AM  SpO2 95 % 05/21/2017 11:03 AM  Vitals shown include unvalidated device data.  Last Pain:  Vitals:   05/21/17 0750  TempSrc: Oral  PainSc:       Patients Stated Pain Goal: 0 (05/21/17 0258)  Complications: No apparent anesthesia complications and glu 100

## 2017-05-21 NOTE — Interval H&P Note (Signed)
History and Physical Interval Note:  05/21/2017 8:36 AM  Debra [name]  has presented today for surgery, with the diagnosis of decubitus  The various methods of treatment have been discussed with the patient and family. After consideration of risks, benefits and other options for treatment, the patient has consented to  Procedure(s): DEBRIDMENT OF SACRAL DECUBITUS ULCER (N/A) as a surgical intervention .  The patient's history has been reviewed, patient examined, no change in status, stable for surgery.  I have reviewed the patient's chart and labs.  Questions were answered to the patient's satisfaction.     [name]

## 2017-05-21 NOTE — Op Note (Signed)
Patient Name:           Debra [name]   Date of Surgery:        05/21/2017  Pre op Diagnosis:      Sacral decubitus ulcer, right gluteus abscess  Post op Diagnosis:    same  Procedure:                 Debridement sacral decubitus ulcer                                      Incision and drainage right gluteus abscess    1.  Progress note or procedure note with a detailed description of the procedure.  2.  Tool used for debridement (curette, scapel, etc.)  Knife and electrocautery  3.  Frequency of surgical debridement.   Initial debridement  4.  Measurement of total devitalized tissue (wound surface) before and after surgical debridement.   Initial wound 7 cm transverse by 10 cm vertical by 2 cm deep                          After debridement wound 9 cm transverse by 12 cm vertical by 3 cm deep  5.  Area and depth of devitalized tissue removed from wound.  Surface area debrided 15 cm.  Depth 1.5 cm  6.  Blood loss and description of tissue removed.  5 mL.  Skin and subcutaneous tissue  7.  Evidence of the progress of the wound's response to treatment.  A.  Current wound volume (current dimensions and depth).  As above  B.  Presence (and extent of) of infection.  Stage IV wound with abscess and osteomyelitis  C.  Presence (and extent of) of non viable tissue.  15 cm  D.  Other material in the wound that is expected to inhibit healing.  osteomyelitis  8.  Was there any viable tissue removed (measurements): none   Surgeon:                     Angelia Mould. Derrell Lolling, M.D., FACS  Assistant:                      Magnus Ivan, RNFA   Indication for Assistant: expedite case.  Provide exposure  Operative Indications:   This is a 52 year old female who is markedly debilitated and deconditioned because of advanced kidney disease, hypertension, neurogenic bladder with chronic indwelling Foley, multiple sclerosis and is bedbound.  She has type 1 diabetes.  She presented with a neglected  sacral decubitus ulcer and has been found to have osteomyelitis and a right gluteus maximus and gluteus minimus abscess.  She is brought to the operating room for drainage of the abscess and debridement as necessary.  Operative Findings:       At the most inferior aspect of the sacral decub is also I entered an abscess probably had 20 mL of purulent material which was cultured.  This tunneled over to the right and I made a counter incision and put a Penrose drain and connected to wounds.  I debrided skin and subcutaneous tissue both on the right and on the left of the sacral decubitus ulcer and it was packed open.  Procedure in Detail:          Following the induction of general endotracheal anesthesia the patient  was turned into the prone position.  This exposed the sacral decubitus ulcer quite nicely.  This area was prepped and draped in a sterile fashion.  Her antibiotics were updated.  Surgical timeout was performed.     The wound was explored with findings as described above. I debrided the edges of the sacral decubitus ulcer inferiorly, on the left on the right where there was a lot of undermining.  The only deep abscess was inferiorly and deep in the wound.  This was explored digitally.  Tissues were evacuated and debrided.  This was irrigated.  I found that the wound tunneled over to the right and made a counterincision there.  I felt like the entire wound had then been explored and exposed.  A Penrose drain was placed in the midline wound over to the right counterincision and the 2 ends of the drain were sewed together with nylon suture.  Hemostasis was very good and achieved with electrocautery.  The wound was packed with Kerlix moistened with saline and a dry bandage on top.  The patient tolerated the procedure well was taken to PACU in stable condition.  EBL 25 mL.  Counts correct.  Competitions none.     Angelia Mould. Derrell Lolling, M.D., FACS General and Minimally Invasive Surgery Breast and  Colorectal Surgery  05/21/2017 11:29 AM

## 2017-05-21 NOTE — Progress Notes (Signed)
CBG - 2143 - 23.   One amp D50 given at 2145.  Follow up CBG at 2224 is 108.  Rechecked CBG at 0106 and it was 101.  Will continue to monitor patient.  Bernie Covey RN-BC, [employer]

## 2017-05-21 NOTE — Anesthesia Procedure Notes (Signed)

## 2017-05-21 NOTE — Progress Notes (Signed)
   Subjective:  No acute events overnight. Patient was seen after her surgery this morning. She complained of pain in her lower back and bilateral lower extremities and was about to receive a dose of IV Dilaudid. Discussed with patient the ulcer area has been cleaned and debrided, and a drain has been left in place for further drainage. She verbalized understanding and is in agreement with plan. Aunt present at bedside and updated as well.   Objective:  Vital signs in last 24 hours: Vitals:   05/21/17 1130 05/21/17 1145 05/21/17 1200 05/21/17 1219  BP: (!) 117/53 113/64 (!) 117/51 (!) 120/52  Pulse: 66 67 68 69  Resp: 16 12 12 16   Temp:   97.6 F (36.4 C) 98.4 F (36.9 C)  TempSrc:    Oral  SpO2: 98% 97% 98% 99%  Weight:      Height:       Physical Exam  Constitutional: She is oriented to person, place, and time. She appears well-developed and well-nourished.  Chronically-ill appearing female. Appears uncomfortable due to pain but in no acute distress   Cardiovascular: Normal rate and regular rhythm. Exam reveals no gallop and no friction rub.  Murmur (II/VI systoliv murmur best heard at USB) heard. Pulmonary/Chest: Effort normal. No respiratory distress.  Abdominal: Soft. Bowel sounds are normal. She exhibits no distension. There is no tenderness.  Musculoskeletal: She exhibits no edema.  Neurological: She is alert and oriented to person, place, and time.  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 POD#0 s/p  surgical debridement with drain left in place. Wound cultures sent. Will continue to monitor pain and VS and continue management as below.  - Surgery and ID following, appreciate recommendations - Continue meropenem; if decompensates add daptomycin to cover for VRE - Follow up wound cultures  - Continue hydrotherapy - Pain management with oxy IR and dilaudid for breakthrough pain with holding parameters; increased dose and frequency today as inadequate pain control  # VRE and proteus bacteremia:Resolved.S/p 5 day of abx therapy with daptomycin and CTX>keflex. Repeat BCx have been neg x5d final. TTE neg for vegetations.   # T1DM: -Lantus 23units qhs - Continue novolog 8 units tid, only if eats >50% of meal - SSI-S + CBG monitoring   # MS: No evidence of flare currently. Will continue monitoring. - PT/OT for continued rehabilitation - Continue home muscle relaxants   Dispo: Anticipated discharge in approximately 3-5 day(s).   [name], MD 05/21/2017, 12:24 PM Pager: [phone]

## 2017-05-21 NOTE — Anesthesia Preprocedure Evaluation (Addendum)
Anesthesia Evaluation  Patient identified by MRN, date of birth, ID band Patient awake    Reviewed: Allergy & Precautions, H&P , NPO status , Patient's Chart, lab work & pertinent test results  History of Anesthesia Complications (+) PONV and history of anesthetic complications  Airway Mallampati: II  TM Distance: >3 FB Neck ROM: Full   Comment: Arabi  (+) Lower Dentures, Upper Dentures   Pulmonary asthma ,    breath sounds clear to auscultation       Cardiovascular hypertension, Pt. on medications and Pt. on home beta blockers + angina (-) Valvular Problems/Murmurs Rhythm:Regular Rate:Normal  '19 TTE - Mild LVH. EF 60% to 65%. Grade 1 diastolic dysfunction. "Very mild" AS with mean gradient (S): 10 mm Hg. Valve area (Vmean): 1.52 cm^2. PASP 40 mm Hg. A small pericardial effusion was identified.   Neuro/Psych  Headaches, PSYCHIATRIC DISORDERS Anxiety Depression MS, RLS, Sleep paralysis  Neuromuscular disease CVA, No Residual Symptoms    GI/Hepatic Neg liver ROS, GERD  Controlled,  Endo/Other  diabetes, Poorly Controlled, Type 1, Insulin DependentMorbid obesity  Renal/GU Renal InsufficiencyRenal disease     Musculoskeletal  (+) Arthritis , Fibromyalgia -  Abdominal   Peds  Hematology  (+) anemia ,   Anesthesia Other Findings Hyponatremia, hypocalcemia; hx ANAPHYLAXIS to PCN  Reproductive/Obstetrics                          Anesthesia Physical  Anesthesia Plan  ASA: III  Anesthesia Plan: General   Post-op Pain Management:    Induction: Intravenous  PONV Risk Score and Plan: 4 or greater and Ondansetron, Treatment may vary due to age or medical condition, Midazolam and Scopolamine patch - Pre-op  Airway Management Planned: Oral ETT  Additional Equipment: None  Intra-op Plan:   Post-operative Plan: Extubation in OR  Informed Consent: I have reviewed the patients History and  Physical, chart, labs and discussed the procedure including the risks, benefits and alternatives for the proposed anesthesia with the patient or authorized representative who has indicated his/her understanding and acceptance.     Plan Discussed with: CRNA and Anesthesiologist  Anesthesia Plan Comments: (2015 - Mac 3 achieved grade 1 view, 7.70m ETT placed. "Difficult airway" listed in chart as "narrow airway". )        Anesthesia Quick Evaluation

## 2017-05-22 ENCOUNTER — Encounter (HOSPITAL_COMMUNITY): Payer: Self-pay | Admitting: General Surgery

## 2017-05-22 ENCOUNTER — Inpatient Hospital Stay: Payer: Self-pay

## 2017-05-22 DIAGNOSIS — M6008 Infective myositis, other site: Secondary | ICD-10-CM

## 2017-05-22 DIAGNOSIS — Z978 Presence of other specified devices: Secondary | ICD-10-CM

## 2017-05-22 DIAGNOSIS — M4628 Osteomyelitis of vertebra, sacral and sacrococcygeal region: Principal | ICD-10-CM

## 2017-05-22 DIAGNOSIS — L89154 Pressure ulcer of sacral region, stage 4: Secondary | ICD-10-CM

## 2017-05-22 LAB — GLUCOSE, CAPILLARY
Glucose-Capillary: 31 mg/dL — CL (ref 65–99)
Glucose-Capillary: 99 mg/dL (ref 65–99)
Glucose-Capillary: 148 mg/dL — ABNORMAL HIGH (ref 65–99)
Glucose-Capillary: 192 mg/dL — ABNORMAL HIGH (ref 65–99)
Glucose-Capillary: 155 mg/dL — ABNORMAL HIGH (ref 65–99)

## 2017-05-22 LAB — CBC
HEMATOCRIT: 22.6 % — AB (ref 36.0–46.0)
Hemoglobin: 7.3 g/dL — ABNORMAL LOW (ref 12.0–15.0)
MCH: 28.5 pg (ref 26.0–34.0)
MCHC: 32.3 g/dL (ref 30.0–36.0)
MCV: 88.3 fL (ref 78.0–100.0)
Platelets: 480 10*3/uL — ABNORMAL HIGH (ref 150–400)
RBC: 2.56 MIL/uL — ABNORMAL LOW (ref 3.87–5.11)
RDW: 15.3 % (ref 11.5–15.5)
WBC: 15 10*3/uL — ABNORMAL HIGH (ref 4.0–10.5)

## 2017-05-22 LAB — BASIC METABOLIC PANEL
Anion gap: 10 (ref 5–15)
BUN: 13 mg/dL (ref 6–20)
CO2: 21 mmol/L — AB (ref 22–32)
CREATININE: 1.18 mg/dL — AB (ref 0.44–1.00)
Calcium: 8.7 mg/dL — ABNORMAL LOW (ref 8.9–10.3)
Chloride: 104 mmol/L (ref 101–111)
GFR calc non Af Amer: 53 mL/min — ABNORMAL LOW (ref >60)
GLUCOSE: 122 mg/dL — AB (ref 65–99)
Potassium: 4.2 mmol/L (ref 3.5–5.1)
Sodium: 135 mmol/L (ref 135–145)

## 2017-05-22 MED ORDER — SODIUM CHLORIDE 0.9% FLUSH
10.0000 mL | INTRAVENOUS | Status: DC | PRN
  Administered 2017-05-24 – 2017-05-25 (×2): 10 mL
  Filled 2017-05-22 (×2): qty 40

## 2017-05-22 MED ORDER — DEXTROSE 50 % IV SOLN
INTRAVENOUS | Status: AC
  Administered 2017-05-22: 50 mL
  Filled 2017-05-22: qty 50

## 2017-05-22 MED ORDER — INSULIN GLARGINE 100 UNIT/ML ~~LOC~~ SOLN
18.0000 [IU] | Freq: Every day | SUBCUTANEOUS | Status: DC
  Administered 2017-05-22: 18 [IU] via SUBCUTANEOUS
  Filled 2017-05-22: qty 0.18

## 2017-05-22 NOTE — Progress Notes (Signed)
CBG: 31  Treatment: D50 IV 61ml  Symptoms: Shaky  Follow-up CBG: Time:0810 CBG Result:99  Possible Reasons for Event: decreased po intake  Comments/MD notified:Dr. Renaldo Reel aware. New order for decrease in Lantus to 18 units    Christell Constant, Dirk Dress

## 2017-05-22 NOTE — Progress Notes (Signed)
   Subjective:  No acute events overnight. T max 99.8. Remains hemodynamically stable. Patient continues to report lower back pain and bilateral LE pain. It does not appear she is asking for pain medication when it is available to her. Discussed with patient she needs to ask her nurse for pain medication and that she has both IV and oral forms available to her.     Objective:  Vital signs in last 24 hours: Vitals:   05/21/17 1658 05/21/17 2104 05/22/17 0306 05/22/17 0833  BP: (!) 118/50 (!) 110/47 (!) 116/57 (!) 107/52  Pulse: 76 74 72 71  Resp: 16 15 16 16   Temp: 99.8 F (37.7 C) 99.5 F (37.5 C) 98.4 F (36.9 C) 98.6 F (37 C)  TempSrc: Oral   Oral  SpO2: 99% 99% 98% 100%  Weight:  126 lb 15.8 oz (57.6 kg)    Height:       Physical Exam  Constitutional: She is oriented to person, place, and time.  Chronically ill appearing female. Uncomfortable due to pain but in no acute distress. Eating breakfast when seen this AM.   Cardiovascular: Normal rate and regular rhythm.  Murmur (II/VI systolic murmur best heard at USB ) heard. Pulmonary/Chest: Effort normal. No respiratory distress.  Abdominal: Soft. Bowel sounds are normal. She exhibits no distension. There is no tenderness.  Neurological: She is alert and oriented to person, place, and time.  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 Wound cx positive for GPCs, awaiting speciation. She remains afebrile  with Tmax 99.8 and hemodynamically stable. She continues to complain of pain. It does not appear she is asking for pain medication when it is available to her. Discussed with patient she needs to ask her nurse for pain medication and that she has both IV and oral forms available to her.   - Surgery and ID following, appreciate recommendations - Continue meropenem - Per ID, can add vancomycin if patient decompensates  - Follow up wound cultures  - Pain management with oxy IR 10 q6h PRN and IV dilaudid 0.5-1mg  q2h PRN  for breakthrough pain with holding parameters; increased dose and frequency today as inadequate pain control  # VRE and proteus bacteremia:Resolved.S/p 5 day of abx therapy with daptomycin and CTX>keflex. Repeat BCx have been neg x5d final. TTE neg for vegetations.   # T1DM:Patient continues to have episodes of hypoglycemia in the setting of inconsistent PO intake. She did no received any short-acting insulin yesterday as she did not eat most of her meals. Will adjust insulin regimen as below.   -Lantus 23--> 18units qhs - Continue novolog 8 units tid, only if eats >50% of meal - SSI-S + CBG monitoring   # MS: No evidence of flare currently. Will continue monitoring. - PT/OT for continued rehabilitation - Continue home muscle relaxants   Dispo: Anticipated discharge in approximately 3-5 day(s).   [name], MD 05/22/2017, 8:56 AM Pager: [phone]

## 2017-05-22 NOTE — Anesthesia Postprocedure Evaluation (Signed)
Anesthesia Post Note  Patient: Debra [name]  Procedure(s) Performed: DEBRIDMENT OF SACRAL DECUBITUS ULCER (N/A Buttocks)     Patient location during evaluation: PACU Anesthesia Type: General Level of consciousness: awake and alert Pain management: pain level controlled Vital Signs Assessment: post-procedure vital signs reviewed and stable Respiratory status: spontaneous breathing, nonlabored ventilation, respiratory function stable and patient connected to nasal cannula oxygen Cardiovascular status: blood pressure returned to baseline and stable Postop Assessment: no apparent nausea or vomiting Anesthetic complications: no    Last Vitals:  Vitals:   05/22/17 1657 05/22/17 2116  BP: (!) 94/36 (!) 101/44  Pulse: 71 69  Resp: 16 16  Temp: 37.4 C 37.2 C  SpO2: 100% 99%    Last Pain:  Vitals:   05/22/17 2116  TempSrc: Oral  PainSc:                  Beryle Lathe

## 2017-05-22 NOTE — Progress Notes (Signed)
Peripherally Inserted Central Catheter/Midline Placement  The IV Nurse has discussed with the patient and/or persons authorized to consent for the patient, the purpose of this procedure and the potential benefits and risks involved with this procedure.  The benefits include less needle sticks, lab draws from the catheter, and the patient may be discharged home with the catheter. Risks include, but not limited to, infection, bleeding, blood clot (thrombus formation), and puncture of an artery; nerve damage and irregular heartbeat and possibility to perform a PICC exchange if needed/ordered by physician.  Alternatives to this procedure were also discussed.  Bard Power PICC patient education guide, fact sheet on infection prevention and patient information card has been provided to patient /or left at bedside.    PICC/Midline Placement Documentation  PICC Single Lumen 05/22/17 PICC Right Brachial 34 cm 0 cm (Active)  Indication for Insertion or Continuance of Line Prolonged intravenous therapies 05/22/2017  5:59 PM  Exposed Catheter (cm) 0 cm 05/22/2017  5:59 PM  Site Assessment Clean;Dry;Intact 05/22/2017  5:59 PM  Line Status Flushed;Saline locked;Blood return noted 05/22/2017  5:59 PM  Dressing Type Transparent 05/22/2017  5:59 PM  Dressing Status Clean;Dry;Intact;Antimicrobial disc in place 05/22/2017  5:59 PM  Dressing Change Due 05/29/17 05/22/2017  5:59 PM       Tamzin Bertling, Lajean Manes 05/22/2017, 5:59 PM

## 2017-05-22 NOTE — Progress Notes (Signed)
[facility] Progress Note  1 Day Post-Op  Subjective: CC- sacral pain Patient states that she is having a lot of pain at surgical site. Alternating dilaudid and oxycodone 10mg  helps. She is tired and does not want to work with therapies today.  Objective: Vital signs in last 24 hours: Temp:  [97.6 F (36.4 C)-99.8 F (37.7 C)] 98.6 F (37 C) (03/26 7680) Pulse Rate:  [66-76] 71 (03/26 0833) Resp:  [0-18] 16 (03/26 0833) BP: (107-120)/(47-64) 107/52 (03/26 0833) SpO2:  [97 %-100 %] 100 % (03/26 0833) Weight:  [126 lb 15.8 oz (57.6 kg)] 126 lb 15.8 oz (57.6 kg) (03/25 2104) Last BM Date: 05/20/17  Intake/Output from previous day: 03/25 0701 - 03/26 0700 In: 2091.3 [P.O.:420; I.V.:1471.3; IV Piggyback:200] Out: 800 [Urine:800] Intake/Output this shift: No intake/output data recorded.  PE: Gen:  Alert, NAD, appears uncomfortable HEENT: EOM's intact, pupils equal and round Pulm:  effort normal GU:     Lab Results:  Recent Labs    05/20/17 0716 05/22/17 0821  WBC 25.2* 15.0*  HGB 7.4* 7.3*  HCT 22.7* 22.6*  PLT 442* 480*   BMET Recent Labs    05/20/17 0716 05/22/17 0821  NA 132* 135  K 4.6 4.2  CL 102 104  CO2 20* 21*  GLUCOSE 166* 122*  BUN 14 13  CREATININE 1.22* 1.18*  CALCIUM 8.6* 8.7*   PT/INR No results for input(s): LABPROT, INR in the last 72 hours. CMP     Component Value Date/Time   NA 135 05/22/2017 0821   NA 135 04/21/2015 0956   K 4.2 05/22/2017 0821   CL 104 05/22/2017 0821   CO2 21 (L) 05/22/2017 0821   GLUCOSE 122 (H) 05/22/2017 0821   BUN 13 05/22/2017 0821   BUN 11 04/21/2015 0956   CREATININE 1.18 (H) 05/22/2017 0821   CREATININE 0.79 08/28/2014 1223   CALCIUM 8.7 (L) 05/22/2017 0821   PROT 5.7 (L) 05/05/2017 0220   PROT 6.4 04/21/2015 0956   ALBUMIN 2.2 (L) 05/05/2017 0220   ALBUMIN 3.6 04/21/2015 0956   AST 14 (L) 05/05/2017 0220   ALT 12 (L) 05/05/2017 0220   ALKPHOS 92 05/05/2017 0220   BILITOT 0.4  05/05/2017 0220   BILITOT <0.2 04/21/2015 0956   GFRNONAA 53 (L) 05/22/2017 0821   GFRNONAA 89 08/28/2014 1223   GFRAA >60 05/22/2017 0821   GFRAA >89 08/28/2014 1223   Lipase  No results found for: LIPASE     Studies/Results: No results found.  Anti-infectives: Anti-infectives (From admission, onward)   Start     Dose/Rate Route Frequency Ordered Stop   05/18/17 1030  meropenem (MERREM) 1 g in sodium chloride 0.9 % 100 mL IVPB     1 g 200 mL/hr over 30 Minutes Intravenous Every 12 hours 05/18/17 0955     05/08/17 2000  DAPTOmycin (CUBICIN) 600 mg in sodium chloride 0.9 % IVPB  Status:  Discontinued     600 mg 224 mL/hr over 30 Minutes Intravenous Every 24 hours 05/08/17 1049 05/10/17 1004   05/08/17 1200  cephALEXin (KEFLEX) capsule 250 mg  Status:  Discontinued     250 mg Oral Every 6 hours 05/08/17 1049 05/10/17 1004   05/06/17 0730  vancomycin (VANCOCIN) 500 mg in sodium chloride 0.9 % 100 mL IVPB  Status:  Discontinued     500 mg 100 mL/hr over 60 Minutes Intravenous Every 12 hours 05/05/17 1924 05/05/17 1956   05/05/17 2000  DAPTOmycin (CUBICIN) 600 mg in  sodium chloride 0.9 % IVPB  Status:  Discontinued     600 mg 224 mL/hr over 30 Minutes Intravenous Every 24 hours 05/05/17 1956 05/08/17 1032   05/05/17 1930  cefTRIAXone (ROCEPHIN) 2 g in sodium chloride 0.9 % 100 mL IVPB  Status:  Discontinued     2 g 200 mL/hr over 30 Minutes Intravenous Every 24 hours 05/05/17 1922 05/08/17 1032   05/05/17 1930  vancomycin (VANCOCIN) IVPB 1000 mg/200 mL premix  Status:  Discontinued     1,000 mg 200 mL/hr over 60 Minutes Intravenous  Once 05/05/17 1924 05/05/17 1956       Assessment/Plan Type Idiabetes MS Bedbound Neurogenic bladder with chronicindwelling foley HTN CKD stage III   Sacral decubitus ulcer, right gluteus abscess S/p Debridement sacral decubitus ulcer and Incision and drainage right gluteus abscess 3/25 Dr. Derrell Lolling - POD 1 - gram stain growing MODERATE  GRAM POSITIVE COCCI, culture pending - WBC trending down 15.0, TMAX 99.8 - abx per ID   ID - currently on merrem 3/22>> FEN - carb modified diet VTE - SCDs, heparin Foley - in place Follow up - Dr. Derrell Lolling  Plan - Abx per ID, follow intraoperative cultures. Continue BID wet to dry dressing changes with Santyl. Continue hydrotherapy. Drain will stay in place.   LOS: 17 days    Franne Forts , [facility] 05/22/2017, 9:14 AM Pager: [phone] Consults: [phone] Mon-Fri 7:00 am-4:30 pm Sat-Sun 7:00 am-11:30 am

## 2017-05-22 NOTE — Progress Notes (Signed)
Patient ID: Debra [name], female   DOB: [birth year], 52 y.o.   MRN: [id number]          [facility]  Date of Admission:  05/05/2017           Day 5 meropenem ASSESSMENT: She has a large sacral wound complicated by abscess and coccygeal osteomyelitis.  She underwent surgery yesterday.  Abscess Gram stain shows gram-positive cocci.  Cultures are pending.  This is probably a mixed aerobic and anaerobic infection.  If she grows MRSA I would add vancomycin.  If VRE is isolated I would agree with daptomycin along with continued meropenem.  PLAN: 1. Continue meropenem pending final culture results 2. Recommend PICC placement  Principal Problem:   Pressure injury of skin Active Problems:   Bacteremia due to vancomycin resistant Enterococcus   Proteus infection   Type 1 diabetes mellitus (HCC)   Multiple sclerosis (HCC)   Neurogenic bladder   Hyperglycemia   Bacteriuria   Foley catheter in place on admission   Osteomyelitis of coccyx (HCC)   Decubitus ulcer of coccygeal region, stage IV (HCC)   Scheduled Meds: . amLODipine  10 mg Oral Daily  . aspirin EC  81 mg Oral Daily  . atorvastatin  40 mg Oral Daily  . baclofen  5 mg Oral TID  . bethanechol  50 mg Oral TID  . collagenase   Topical BID  . heparin  5,000 Units Subcutaneous Q8H  . insulin aspart  2-10 Units Subcutaneous TID AC  . insulin aspart  8 Units Subcutaneous TID WC  . insulin glargine  18 Units Subcutaneous QHS  . lactose free nutrition  237 mL Oral TID WC  . lisinopril  20 mg Oral Daily  . mouth rinse  15 mL Mouth Rinse BID  . metoprolol succinate  100 mg Oral Daily  . mirtazapine  45 mg Oral QHS  . ondansetron  4 mg Oral Daily  . pantoprazole  40 mg Oral Daily  . risperiDONE  1 mg Oral QHS  . tamsulosin  0.4 mg Oral Daily  . Vitamin D (Ergocalciferol)  50,000 Units Oral Q7 days   Continuous Infusions: . sodium chloride 50 mL/hr at 05/22/17 1005  . lactated ringers 10 mL/hr at  05/21/17 0859  . meropenem (MERREM) IV 1 g (05/22/17 1007)   PRN Meds:.acetaminophen, HYDROmorphone (DILAUDID) injection, loperamide, magnesium hydroxide, meclizine, oxyCODONE, polyethylene glycol, zolpidem   SUBJECTIVE: She states that she is hurting all over.  Review of Systems: Review of Systems  Unable to perform ROS: Medical condition    Allergies  Allergen Reactions  . Penicillins Anaphylaxis, Nausea And Vomiting and Rash    Has patient had a PCN reaction causing immediate rash, facial/tongue/throat swelling, SOB or lightheadedness with hypotension: Yes Has patient had a PCN reaction causing severe rash involving mucus membranes or skin necrosis: Yes Has patient had a PCN reaction that required hospitalization No Has patient had a PCN reaction occurring within the last 10 years: Yes If all of the above answers are "NO", then may proceed with Cephalosporin use.   . Pollen Extract Other (See Comments)    Seasonal Allergies  . Tape Rash    OBJECTIVE: Vitals:   05/21/17 2104 05/22/17 0306 05/22/17 0833 05/22/17 0933  BP: (!) 110/47 (!) 116/57 (!) 107/52 (!) 107/44  Pulse: 74 72 71 79  Resp: 15 16 16    Temp: 99.5 F (37.5 C) 98.4 F (36.9 C) 98.6 F (37 C)  TempSrc:   Oral   SpO2: 99% 98% 100%   Weight: 126 lb 15.8 oz (57.6 kg)     Height:       Body mass index is 21.13 kg/m.  Physical Exam  Constitutional:  She is resting quietly in bed with her eyes closed.  She answers with very brief responses.  Musculoskeletal:  Sacral wound bandaged and not examined.    Lab Results Lab Results  Component Value Date   WBC 15.0 (H) 05/22/2017   HGB 7.3 (L) 05/22/2017   HCT 22.6 (L) 05/22/2017   MCV 88.3 05/22/2017   PLT 480 (H) 05/22/2017    Lab Results  Component Value Date   CREATININE 1.18 (H) 05/22/2017   BUN 13 05/22/2017   NA 135 05/22/2017   K 4.2 05/22/2017   CL 104 05/22/2017   CO2 21 (L) 05/22/2017    Lab Results  Component Value Date   ALT 12  (L) 05/05/2017   AST 14 (L) 05/05/2017   ALKPHOS 92 05/05/2017   BILITOT 0.4 05/05/2017     Microbiology: Recent Results (from the past 240 hour(s))  Culture, blood (routine x 2)     Status: None   Collection Time: 05/14/17 11:55 AM  Result Value Ref Range Status   Specimen Description BLOOD LEFT ANTECUBITAL  Final   Special Requests IN PEDIATRIC BOTTLE Blood Culture adequate volume  Final   Culture   Final    NO GROWTH 5 DAYS Performed at [facility], [address] [place], [place] [address]    Report Status 05/19/2017 FINAL  Final  Culture, blood (routine x 2)     Status: None   Collection Time: 05/14/17 11:58 AM  Result Value Ref Range Status   Specimen Description BLOOD LEFT ANTECUBITAL  Final   Special Requests IN PEDIATRIC BOTTLE Blood Culture adequate volume  Final   Culture   Final    NO GROWTH 5 DAYS Performed at [facility], [address] [place], [place] [address]    Report Status 05/19/2017 FINAL  Final  Culture, Urine     Status: Abnormal   Collection Time: 05/14/17  2:02 PM  Result Value Ref Range Status   Specimen Description URINE, CATHETERIZED  Final   Special Requests NONE  Final   Culture (A)  Final    >=100,000 COLONIES/mL KLEBSIELLA PNEUMONIAE Confirmed Extended Spectrum Beta-Lactamase Producer (ESBL).  In bloodstream infections from ESBL organisms, carbapenems are preferred over piperacillin/tazobactam. They are shown to have a lower risk of mortality. Performed at [facility], [address], [place], [place] [address]    Report Status 05/16/2017 FINAL  Final   Organism ID, Bacteria KLEBSIELLA PNEUMONIAE (A)  Final      Susceptibility   Klebsiella pneumoniae - MIC*    AMPICILLIN >=32 RESISTANT Resistant     CEFAZOLIN >=64 RESISTANT Resistant     CEFTRIAXONE >=64 RESISTANT Resistant     CIPROFLOXACIN >=4 RESISTANT Resistant     GENTAMICIN >=16 RESISTANT Resistant     IMIPENEM <=0.25 SENSITIVE Sensitive     NITROFURANTOIN  128 RESISTANT Resistant     TRIMETH/SULFA >=320 RESISTANT Resistant     AMPICILLIN/SULBACTAM >=32 RESISTANT Resistant     PIP/TAZO >=128 RESISTANT Resistant     Extended ESBL POSITIVE Resistant     * >=100,000 COLONIES/mL KLEBSIELLA PNEUMONIAE  Surgical pcr screen     Status: None   Collection Time: 05/21/17  3:40 AM  Result Value Ref Range  Status   MRSA, PCR NEGATIVE NEGATIVE Final   Staphylococcus aureus NEGATIVE NEGATIVE Final    Comment: (NOTE) The Xpert SA Assay (FDA approved for NASAL specimens in patients 27 years of age and older), is one component of a comprehensive surveillance program. It is not intended to diagnose infection nor to guide or monitor treatment. Performed at [facility], [address], [place], [place] [address]   Aerobic/Anaerobic Culture (surgical/deep wound)     Status: None (Preliminary result)   Collection Time: 05/21/17 11:01 AM  Result Value Ref Range Status   Specimen Description ABSCESS  Final   Special Requests NONE  Final   Gram Stain   Final    ABUNDANT WBC PRESENT, PREDOMINANTLY PMN MODERATE GRAM POSITIVE COCCI    Culture   Final    CULTURE REINCUBATED FOR BETTER GROWTH Performed at [facility], [address], [place], [place] [address]    Report Status PENDING  Incomplete    Cliffton Asters, MD [facility] [phone] pager   [phone] cell 05/22/2017, 1:09 PM

## 2017-05-22 NOTE — Progress Notes (Signed)
Occupational Therapy Treatment Patient Details Name: Debra [name] MRN: [id number] DOB: 09/14/1965 Today's Date: 05/22/2017    History of present illness Debra [name] is a 52 y/o female with PMH of diabetes, MS resulting in neurogenic bladder (with chronic indwelling foley), HTN, CKD III, recurrent UTIs and depression who presents from her nursing facility with elevated blood sugar. She was recently admitted from 2/14-2/26 for sepsis secondary to Proteus bacteremia, VRE UTI, requiring intubation 2/15-2/19 and pressors, as well as C. Diff infection.   OT comments  Pt presents sidelying on L in bed, agreeable to therapy though continues to have decreased activity tolerance due to pain. Pt with incontinent BM at start of session, requiring total assist +2 for peri-care, MaxA+2 for rolling to L/R. Pt continues to have flat affect and minimally responsive during session. Pt positioned on R side end of session for continued pressure relief. Continue per POC.    Follow Up Recommendations  SNF    Equipment Recommendations  None recommended by OT          Precautions / Restrictions Precautions Precautions: Fall Precaution Comments: decubitus ulcer on sacrum Restrictions Weight Bearing Restrictions: Yes       Mobility Bed Mobility Overal bed mobility: Needs Assistance Bed Mobility: Rolling Rolling: Max assist;+2 for physical assistance         General bed mobility comments: rolling to L/R with ModA+2 during peri-care; pt positioned on R side end of session                                                                    ADL either performed or assessed with clinical judgement   ADL Overall ADL's : Needs assistance/impaired                 Upper Body Dressing : Moderate assistance;Bed level Upper Body Dressing Details (indicate cue type and reason): doffing/donning new gown  Lower Body Dressing: Total assistance;Bed level Lower Body  Dressing Details (indicate cue type and reason): total assist to don socks      Toileting- Clothing Manipulation and Hygiene: Total assistance;Bed level       Functional mobility during ADLs: Maximal assistance;+2 for physical assistance(bed mobility) General ADL Comments: planned to see pt for scheduled tilt bed session, however pt with incontinent BM start of session; pt requiring total assist+2 for peri-care and to don new gown at bed level, rolling to L/R to assist with care                         Cognition Arousal/Alertness: Awake/alert Behavior During Therapy: Flat affect Overall Cognitive Status: Within Functional Limits for tasks assessed                                 General Comments: pt continues to demonstrate depressed demeanor, minimally verbal this date and soft spoken                          Pertinent Vitals/ Pain       Pain Assessment: Faces Faces Pain Scale: Hurts whole lot Pain Location: generalized Pain Descriptors / Indicators: Grimacing;Guarding;Crying Pain Intervention(s):  Monitored during session;Limited activity within patient's tolerance;Repositioned                                                          Frequency  Min 2X/week        Progress Toward Goals  OT Goals(current goals can now be found in the care plan section)  Progress towards OT goals: Progressing toward goals  Acute Rehab OT Goals Patient Stated Goal: decrease pain OT Goal Formulation: With patient Time For Goal Achievement: 06/05/17 Potential to Achieve Goals: Fair  Plan Discharge plan remains appropriate                     AM-PAC PT "6 Clicks" Daily Activity     Outcome Measure   Help from another person eating meals?: A Little Help from another person taking care of personal grooming?: A Little Help from another person toileting, which includes using toliet, bedpan, or urinal?: Total Help from  another person bathing (including washing, rinsing, drying)?: A Lot Help from another person to put on and taking off regular upper body clothing?: A Lot Help from another person to put on and taking off regular lower body clothing?: Total 6 Click Score: 12    End of Session    OT Visit Diagnosis: Muscle weakness (generalized) (M62.81);Pain Pain - part of body: (sacrum)   Activity Tolerance Patient tolerated treatment well;Patient limited by pain   Patient Left in bed;with call bell/phone within reach;with nursing/sitter in room(nursing student )   Nurse Communication Mobility status        Time: 0233-4356 OT Time Calculation (min): 25 min  Charges: OT General Charges $OT Visit: 1 Visit OT Treatments $Self Care/Home Management : 23-37 mins  Marcy Siren, OT Pager [phone] 05/22/2017    Orlando Penner 05/22/2017, 10:57 AM

## 2017-05-22 NOTE — Progress Notes (Signed)
Physical Therapy Wound Treatment Patient Details  Name: Debra [name] MRN: [id number] Date of Birth: [birth year]  Today's Date: 05/22/2017 Time: 4287-6811 Time Calculation (min): 25 min  Subjective  Subjective: Pt reports having a lot of pain with rolling for pericare Patient and Family Stated Goals: have wound heal and pain decrease  Pain Score: Pt premedicated prior to session, however pt pain 10/10 on Facial Pain Scale with treatment.   Wound Assessment  Pressure Injury 05/07/17 Unstageable - Full thickness tissue loss in which the base of the ulcer is covered by slough (yellow, tan, gray, green or brown) and/or eschar (tan, brown or black) in the wound bed. Sacral Wound to R of midline (Active)  Wound Image   05/16/2017  9:00 AM  Dressing Type ABD;Moist to dry;Barrier Film (skin prep) 05/22/2017  5:00 PM  Dressing Changed;Clean;Dry;Intact 05/22/2017  5:00 PM  Dressing Change Frequency Daily 05/22/2017  5:00 PM  State of Healing Early/partial granulation 05/22/2017  5:00 PM  Site / Wound Assessment Yellow 05/22/2017  5:00 PM  % Wound base Red or Granulating 45% 05/22/2017  5:00 PM  % Wound base Yellow/Fibrinous Exudate 65% 05/22/2017  5:00 PM  % Wound base Black/Eschar 0% 05/22/2017  5:00 PM  % Wound base Other/Granulation Tissue (Comment) 0% 05/22/2017  5:00 PM  Peri-wound Assessment Erythema (blanchable);Pink 05/22/2017  5:00 PM  Wound Length (cm) 10.5 cm 05/16/2017  4:00 PM  Wound Width (cm) 6 cm 05/16/2017  4:00 PM  Wound Depth (cm) 2 cm 05/16/2017  4:00 PM  Wound Surface Area (cm^2) 63 cm^2 05/16/2017  4:00 PM  Wound Volume (cm^3) 126 cm^3 05/16/2017  4:00 PM  Tunneling (cm) 3 cm 1 o'clock  05/16/2017  4:00 PM  Undermining (cm) depth of 1.5 cm from 6 - 8 o'clock , depth of 1 cm 12- 1 o'clock 05/16/2017  4:00 PM  Margins Unattached edges (unapproximated) 05/22/2017  5:00 PM  Drainage Amount Moderate 05/22/2017  5:00 PM  Drainage Description Serosanguineous 05/22/2017  5:00 PM  Treatment  Cleansed;Debridement (Selective);Hydrotherapy (Pulse lavage);Packing (Saline gauze) 05/22/2017  5:00 PM   Santyl applied to necrotic material in wound bed.   Hydrotherapy Pulsed lavage therapy - wound location: sacrum Pulsed Lavage with Suction (psi): 8 psi(8-12) Pulsed Lavage with Suction - Normal Saline Used: 1000 mL Pulsed Lavage Tip: Tip with splash shield Selective Debridement Selective Debridement - Location: Sacrum Selective Debridement - Tools Used: Forceps;Scissors Selective Debridement - Tissue Removed: white fibrous necrotic tissue from base of wound   Wound Assessment and Plan  Wound Therapy - Assess/Plan/Recommendations Wound Therapy - Clinical Statement: Pt is s/p surgical debidement and has scant amount of white fibous material at base of wound requiring debridement. Pt has two drains placed at the distal end of the wound. Moderate of serous drainage present as a result.  Factors Delaying/Impairing Wound Healing: Diabetes Mellitus;Multiple medical problems;Immobility Hydrotherapy Plan: Debridement;Dressing change;Pulsatile lavage with suction;Patient/family education Wound Therapy - Frequency: 6X / week Wound Therapy - Current Recommendations: Surgery consult Wound Therapy - Follow Up Recommendations: Skilled nursing facility Wound Plan: see above  Wound Therapy Goals- Improve the function of patient's integumentary system by progressing the wound(s) through the phases of wound healing (inflammation - proliferation - remodeling) by: Decrease Necrotic Tissue to: 20 Decrease Necrotic Tissue - Progress: Progressing toward goal Increase Granulation Tissue to: 80 Increase Granulation Tissue - Progress: Progressing toward goal  Goals will be updated until maximal potential achieved or discharge criteria met.  Discharge criteria: when goals achieved, discharge from  hospital, MD decision/surgical intervention, no progress towards goals, refusal/missing three consecutive treatments  without notification or medical reason.  GP    [name]. [name], DPT Acute Rehabilitation  [phone] Pager [phone]   [place] 05/22/2017, 5:38 PM

## 2017-05-23 LAB — CBC
HCT: 21.6 % — ABNORMAL LOW (ref 36.0–46.0)
HEMOGLOBIN: 7.1 g/dL — AB (ref 12.0–15.0)
MCH: 29 pg (ref 26.0–34.0)
MCHC: 32.9 g/dL (ref 30.0–36.0)
MCV: 88.2 fL (ref 78.0–100.0)
PLATELETS: 486 10*3/uL — AB (ref 150–400)
RBC: 2.45 MIL/uL — AB (ref 3.87–5.11)
RDW: 15.3 % (ref 11.5–15.5)
WBC: 10.7 10*3/uL — AB (ref 4.0–10.5)

## 2017-05-23 LAB — BASIC METABOLIC PANEL
ANION GAP: 10 (ref 5–15)
BUN: 10 mg/dL (ref 6–20)
CO2: 20 mmol/L — ABNORMAL LOW (ref 22–32)
Calcium: 8.6 mg/dL — ABNORMAL LOW (ref 8.9–10.3)
Chloride: 105 mmol/L (ref 101–111)
Creatinine, Ser: 1.11 mg/dL — ABNORMAL HIGH (ref 0.44–1.00)
GFR, EST NON AFRICAN AMERICAN: 57 mL/min — AB (ref >60)
Glucose, Bld: 80 mg/dL (ref 65–99)
POTASSIUM: 4 mmol/L (ref 3.5–5.1)
SODIUM: 135 mmol/L (ref 135–145)

## 2017-05-23 LAB — GLUCOSE, CAPILLARY
GLUCOSE-CAPILLARY: 93 mg/dL (ref 65–99)
GLUCOSE-CAPILLARY: 114 mg/dL — AB (ref 65–99)
GLUCOSE-CAPILLARY: 108 mg/dL — AB (ref 65–99)
Glucose-Capillary: 47 mg/dL — ABNORMAL LOW (ref 65–99)
Glucose-Capillary: 100 mg/dL — ABNORMAL HIGH (ref 65–99)

## 2017-05-23 MED ORDER — INSULIN ASPART 100 UNIT/ML ~~LOC~~ SOLN
3.0000 [IU] | Freq: Three times a day (TID) | SUBCUTANEOUS | Status: DC
  Administered 2017-05-24 – 2017-05-25 (×4): 3 [IU] via SUBCUTANEOUS

## 2017-05-23 MED ORDER — INSULIN ASPART 100 UNIT/ML ~~LOC~~ SOLN: 6.0000 [IU] | Freq: Three times a day (TID) | SUBCUTANEOUS | Status: DC

## 2017-05-23 MED ORDER — INSULIN GLARGINE 100 UNIT/ML ~~LOC~~ SOLN
12.0000 [IU] | Freq: Every day | SUBCUTANEOUS | Status: DC
  Administered 2017-05-23: 12 [IU] via SUBCUTANEOUS
  Filled 2017-05-23: qty 0.12

## 2017-05-23 NOTE — Progress Notes (Signed)
Physical Therapy Wound Treatment Patient Details  Name: Debra [name] MRN: [id number] Date of Birth: 09/14/1965  Today's Date: 05/23/2017 Time: 5170-0174 Time Calculation (min): 45 min  Subjective  Subjective: Pt much more interactive today despite reporting she is not feeling any better Patient and Family Stated Goals: have wound heal and pain decrease  Pain Score: Pt premedicated prior to treatment, however still with complaints of 8/10 pain Wound Assessment  Pressure Injury 05/07/17 Unstageable - Full thickness tissue loss in which the base of the ulcer is covered by slough (yellow, tan, gray, green or brown) and/or eschar (tan, brown or black) in the wound bed. Sacral Wound to R of midline (Active)  Dressing Type ABD;Moist to dry;Barrier Film (skin prep) 05/23/2017  5:00 PM  Dressing Changed;Clean;Dry;Intact 05/23/2017  5:00 PM  Dressing Change Frequency Daily 05/23/2017  5:00 PM  State of Healing Early/partial granulation 05/23/2017  5:00 PM  Site / Wound Assessment Yellow 05/23/2017  5:00 PM  % Wound base Red or Granulating 45% 05/23/2017  5:00 PM  % Wound base Yellow/Fibrinous Exudate 65% 05/23/2017  5:00 PM  % Wound base Black/Eschar 0% 05/23/2017  5:00 PM  % Wound base Other/Granulation Tissue (Comment) 0% 05/23/2017  5:00 PM  Peri-wound Assessment Erythema (blanchable);Pink 05/23/2017  5:00 PM  Wound Length (cm) 10 cm 05/23/2017  5:00 PM  Wound Width (cm) 6.5 cm 05/23/2017  5:00 PM  Wound Depth (cm) 3 cm 05/23/2017  5:00 PM  Wound Surface Area (cm^2) 65 cm^2 05/23/2017  5:00 PM  Wound Volume (cm^3) 195 cm^3 05/23/2017  5:00 PM  Tunneling (cm) 3 cm 1 o'clock  05/16/2017  4:00 PM  Undermining (cm) 12-1 o'clock 1 cm, 1-2 o'clock 2.5 cm 2-3 o'clock 1 cm, 4-6 o'clock drains in place undermining approx 3 cm  05/23/2017  5:00 PM  Margins Unattached edges (unapproximated) 05/23/2017  5:00 PM  Drainage Amount Moderate 05/23/2017  5:00 PM  Drainage Description Serosanguineous 05/23/2017  5:00 PM   Treatment Cleansed;Debridement (Selective);Hydrotherapy (Pulse lavage);Packing (Saline gauze) 05/23/2017  5:00 PM   Santyl applied to necrotic tissue in the wound base   Hydrotherapy Pulsed lavage therapy - wound location: sacrum Pulsed Lavage with Suction (psi): 8 psi(8-12) Pulsed Lavage with Suction - Normal Saline Used: 1000 mL Pulsed Lavage Tip: Tip with splash shield Selective Debridement Selective Debridement - Location: Sacrum Selective Debridement - Tools Used: Forceps;Scissors Selective Debridement - Tissue Removed: white fibrous necrotic tissue from base of wound   Wound Assessment and Plan  Wound Therapy - Assess/Plan/Recommendations Wound Therapy - Clinical Statement: Pt has moderate amount of serous drainage today, Undermining is beginning to open up on the proximal end of the wound, able to selectively debride large amount of sloughy, semisolid material from the area.  Factors Delaying/Impairing Wound Healing: Diabetes Mellitus;Multiple medical problems;Immobility Hydrotherapy Plan: Debridement;Dressing change;Pulsatile lavage with suction;Patient/family education Wound Therapy - Frequency: 6X / week Wound Therapy - Current Recommendations: Surgery consult Wound Therapy - Follow Up Recommendations: Skilled nursing facility Wound Plan: see above  Wound Therapy Goals- Improve the function of patient's integumentary system by progressing the wound(s) through the phases of wound healing (inflammation - proliferation - remodeling) by: Decrease Necrotic Tissue to: 20 Decrease Necrotic Tissue - Progress: Progressing toward goal Increase Granulation Tissue to: 80 Increase Granulation Tissue - Progress: Progressing toward goal  Goals will be updated until maximal potential achieved or discharge criteria met.  Discharge criteria: when goals achieved, discharge from hospital, MD decision/surgical intervention, no progress towards goals, refusal/missing three consecutive  treatments  without notification or medical reason.  GP    [name]. [name], DPT Acute Rehabilitation  [phone] Pager [phone]   [place] 05/23/2017, 5:55 PM

## 2017-05-23 NOTE — Progress Notes (Signed)
[facility] Progress Note  2 Days Post-Op  Subjective: CC- pain at sacral wound No new complaints. Patient continues to have pain at sacral wound site. States that it hurts at rest, during dressing changes, and with hydrotherapy. No improvement from yesterday. She took dilaudid 1mg  5x yesterday and oxycodone 10mg  BID. PICC placed yesterday.  Objective: Vital signs in last 24 hours: Temp:  [98.6 F (37 C)-99.3 F (37.4 C)] 98.9 F (37.2 C) (03/27 0842) Pulse Rate:  [69-75] 75 (03/27 0842) Resp:  [16] 16 (03/27 0842) BP: (94-132)/(36-66) 132/66 (03/27 0842) SpO2:  [98 %-100 %] 98 % (03/27 0842) Last BM Date: 05/22/17  Intake/Output from previous day: 03/26 0701 - 03/27 0700 In: 1820 [P.O.:420; I.V.:1200; IV Piggyback:200] Out: 900 [Urine:900] Intake/Output this shift: No intake/output data recorded.  PE: Gen:  Alert, NAD, depressed affect HEENT: EOM's intact, pupils equal and round Pulm:  effort normal GU:     Lab Results:  Recent Labs    05/22/17 0821 05/23/17 0445  WBC 15.0* 10.7*  HGB 7.3* 7.1*  HCT 22.6* 21.6*  PLT 480* 486*   BMET Recent Labs    05/22/17 0821 05/23/17 0445  NA 135 135  K 4.2 4.0  CL 104 105  CO2 21* 20*  GLUCOSE 122* 80  BUN 13 10  CREATININE 1.18* 1.11*  CALCIUM 8.7* 8.6*   PT/INR No results for input(s): LABPROT, INR in the last 72 hours. CMP     Component Value Date/Time   NA 135 05/23/2017 0445   NA 135 04/21/2015 0956   K 4.0 05/23/2017 0445   CL 105 05/23/2017 0445   CO2 20 (L) 05/23/2017 0445   GLUCOSE 80 05/23/2017 0445   BUN 10 05/23/2017 0445   BUN 11 04/21/2015 0956   CREATININE 1.11 (H) 05/23/2017 0445   CREATININE 0.79 08/28/2014 1223   CALCIUM 8.6 (L) 05/23/2017 0445   PROT 5.7 (L) 05/05/2017 0220   PROT 6.4 04/21/2015 0956   ALBUMIN 2.2 (L) 05/05/2017 0220   ALBUMIN 3.6 04/21/2015 0956   AST 14 (L) 05/05/2017 0220   ALT 12 (L) 05/05/2017 0220   ALKPHOS 92 05/05/2017 0220   BILITOT 0.4  05/05/2017 0220   BILITOT <0.2 04/21/2015 0956   GFRNONAA 57 (L) 05/23/2017 0445   GFRNONAA 89 08/28/2014 1223   GFRAA >60 05/23/2017 0445   GFRAA >89 08/28/2014 1223   Lipase  No results found for: LIPASE     Studies/Results: [place] Ekg Site Rite  Result Date: 05/22/2017 If Site Rite image not attached, placement could not be confirmed due to current cardiac rhythm.   Anti-infectives: Anti-infectives (From admission, onward)   Start     Dose/Rate Route Frequency Ordered Stop   05/18/17 1030  meropenem (MERREM) 1 g in sodium chloride 0.9 % 100 mL IVPB     1 g 200 mL/hr over 30 Minutes Intravenous Every 12 hours 05/18/17 0955     05/08/17 2000  DAPTOmycin (CUBICIN) 600 mg in sodium chloride 0.9 % IVPB  Status:  Discontinued     600 mg 224 mL/hr over 30 Minutes Intravenous Every 24 hours 05/08/17 1049 05/10/17 1004   05/08/17 1200  cephALEXin (KEFLEX) capsule 250 mg  Status:  Discontinued     250 mg Oral Every 6 hours 05/08/17 1049 05/10/17 1004   05/06/17 0730  vancomycin (VANCOCIN) 500 mg in sodium chloride 0.9 % 100 mL IVPB  Status:  Discontinued     500 mg 100 mL/hr over 60 Minutes Intravenous  Every 12 hours 05/05/17 1924 05/05/17 1956   05/05/17 2000  DAPTOmycin (CUBICIN) 600 mg in sodium chloride 0.9 % IVPB  Status:  Discontinued     600 mg 224 mL/hr over 30 Minutes Intravenous Every 24 hours 05/05/17 1956 05/08/17 1032   05/05/17 1930  cefTRIAXone (ROCEPHIN) 2 g in sodium chloride 0.9 % 100 mL IVPB  Status:  Discontinued     2 g 200 mL/hr over 30 Minutes Intravenous Every 24 hours 05/05/17 1922 05/08/17 1032   05/05/17 1930  vancomycin (VANCOCIN) IVPB 1000 mg/200 mL premix  Status:  Discontinued     1,000 mg 200 mL/hr over 60 Minutes Intravenous  Once 05/05/17 1924 05/05/17 1956       Assessment/Plan Type Idiabetes MS Bedbound Neurogenic bladder with chronicindwelling foley HTN CKD stage III   Sacral decubitus ulcer, right gluteus abscess S/p Debridement  sacral decubitus ulcer and Incision and drainage right gluteus abscess 3/25 Dr. Derrell Lolling - POD 2 - gram stain growing MODERATE GRAM POSITIVE COCCI, culture pending - WBC trending down 10.7, TMAX 99.3 - abx per ID   ID - currently on merrem 3/22>> FEN - carb modified diet VTE - SCDs, heparin Foley - in place Follow up - Dr. Derrell Lolling  Plan - Continue BID wet to dry dressing changes with Santyl as well as hydrotherapy. Drain will stay in place. Abx per ID, follow intraoperative cultures.   LOS: 18 days    Franne Forts , [facility] Surgery 05/23/2017, 9:33 AM Pager: [phone] Consults: [phone] Mon-Fri 7:00 am-4:30 pm Sat-Sun 7:00 am-11:30 am

## 2017-05-23 NOTE — Progress Notes (Signed)
Patient ID: Debra [name], female   DOB: 21-May-1965, 52 y.o.   MRN: [id number]          [facility] for Infectious Disease    Date of Admission:  05/05/2017   Day 6 meropenem         She remains afebrile.  Sacral abscess cultures are now growing enterococcus.  This may very well be VRE but I would wait on final antibiotic susceptibilities before making a decision about adding daptomycin.  I will follow-up tomorrow morning.         Cliffton Asters, MD [facility] for Infectious Disease [facility] [phone] pager   [phone] cell 05/23/2017, 2:41 PM

## 2017-05-23 NOTE — Progress Notes (Signed)
   Subjective:  No acute events overnight.  Patient does complain of lower back pain and bilateral lower extremity pain.  Continue to discuss with patient she has IV pain medication available to her and she will need to requested in order to receive it.  Discussed with patient we continue to wait for wound culture information to determine appropriate antibiotic therapy. Patient verbalized understanding and is in agreement with plan.   Objective:  Vital signs in last 24 hours: Vitals:   05/22/17 1657 05/22/17 2116 05/23/17 0513 05/23/17 0842  BP: (!) 94/36 (!) 101/44 (!) 126/59 132/66  Pulse: 71 69 70 75  Resp: 16 16 16 16   Temp: 99.3 F (37.4 C) 98.9 F (37.2 C) 98.6 F (37 C) 98.9 F (37.2 C)  TempSrc: Oral Oral Oral Oral  SpO2: 100% 99% 100% 98%  Weight:      Height:       Physical Exam  Constitutional: She is oriented to person, place, and time.  Chronically ill-appearing female lying in bed.  Appears uncomfortable secondary to pain but otherwise no acute distress  Cardiovascular: Normal rate and regular rhythm. Exam reveals no gallop and no friction rub.  Murmur (II/VI systolic murmur unchanged from prior exams) heard. Pulmonary/Chest: Effort normal. No respiratory distress.  Abdominal: Soft. Bowel sounds are normal. She exhibits no distension. There is no tenderness.  Musculoskeletal: She exhibits no edema.  Neurological: She is alert and oriented to person, place, and time.    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 Wound cx positive Enterococcus fecalis, awaiting susceptibilities. She remains afebrile with Tmax 99.8 and hemodynamically stable.  ID recommended continuing meropenem pending susceptibilities. PICC line placed 3/26.  We will add daptomycin if patient decompensates clinically.  She continues to complain of pain. It does not appear she is asking for pain medication when it is available to her. Discussed with patient she needs to ask her nurse for pain medication and that she has both IV and oral forms available to her.  -Surgery andID following, appreciate recommendations -Continue meropenem - We will add daptomycin if patient decompensates - Follow up ulcer sensitivities -Pain management with oxy IR 10 q6h PRN and IV dilaudid 0.5-1mg  q2h PRN  for breakthrough pain with holding parameters   #VRE and proteus bacteremia:Resolved.S/p 5 day of abx therapy with daptomycin and CTX>keflex. Repeat BCx have been neg x5d final. TTE neg for vegetations.   #T1DM:Patient continues to have episodes of hypoglycemia in the setting of inconsistent PO intake. She received 2 units of short acting insulin yesterday along with her long acting insulin.    -Lantus 18--> 12units qhs - Novolog 8-->3 units tid, only if eats >50% of meal - SSI-S + CBG monitoring   #MS: No evidence of flare currently. Will continue monitoring. - PT/OT for continued rehabilitation - Continue home muscle relaxants   Dispo: Anticipated discharge in approximately 3-5 day(s).   [name], MD 05/23/2017, 4:50 PM Pager: [phone]

## 2017-05-23 NOTE — Progress Notes (Signed)
Physical Therapy Treatment Patient Details Name: Debra [name] MRN: [id number] DOB: February 05, 1966 Today's Date: 05/23/2017    History of Present Illness Debra [name] is a 52 y/o female with PMH of diabetes, MS resulting in neurogenic bladder (with chronic indwelling foley), HTN, CKD III, recurrent UTIs and depression who presents from her nursing facility with elevated blood sugar. She was recently admitted from 2/14-2/26 for sepsis secondary to Proteus bacteremia, VRE UTI, requiring intubation 2/15-2/19 and pressors, as well as C. Diff infection.    PT Comments    Pt making slow progress with functional mobility. Pt able to achieve sitting EOB with max A x2 and tolerated sitting EOB ~5 mins with max A, progressing to brief periods of close min guard. Pt would continue to benefit from skilled physical therapy services at this time while admitted and after d/c to address the below listed limitations in order to improve overall safety and independence with functional mobility.    Follow Up Recommendations  SNF     Equipment Recommendations  None recommended by PT    Recommendations for Other Services       Precautions / Restrictions Precautions Precautions: Fall Precaution Comments: decubitus ulcer on sacrum Restrictions Weight Bearing Restrictions: No    Mobility  Bed Mobility Overal bed mobility: Needs Assistance Bed Mobility: Rolling;Sidelying to Sit;Sit to Sidelying Rolling: Max assist;+2 for physical assistance Sidelying to sit: Max assist     Sit to sidelying: Max assist;+2 for physical assistance General bed mobility comments: increased time, very painful with mobility, pt able to use UEs on bed rails  Transfers                 General transfer comment: unable to tolerate; pt very anxious, fearful of falling and painful  Ambulation/Gait                 Stairs            Wheelchair Mobility    Modified Rankin (Stroke Patients Only)        Balance Overall balance assessment: Needs assistance Sitting-balance support: Bilateral upper extremity supported Sitting balance-Leahy Scale: Poor Sitting balance - Comments: pt initially requiring max A with posterior lean and then heavy anterior lean; progressing to brief periods of close min guard; pt tolerated sitting EOB ~5 mins; pt expressing fear of falling                                    Cognition Arousal/Alertness: Awake/alert Behavior During Therapy: Flat affect Overall Cognitive Status: Impaired/Different from baseline Area of Impairment: Following commands;Safety/judgement;Problem solving                       Following Commands: Follows one step commands with increased time Safety/Judgement: Decreased awareness of deficits;Decreased awareness of safety   Problem Solving: Decreased initiation;Requires verbal cues        Exercises      General Comments        Pertinent Vitals/Pain Pain Assessment: Faces Faces Pain Scale: Hurts whole lot Pain Location: buttocks Pain Descriptors / Indicators: Grimacing;Guarding;Moaning Pain Intervention(s): Monitored during session;Repositioned;Patient requesting pain meds-RN notified;RN gave pain meds during session    Home Living                      Prior Function            PT Goals (  current goals can now be found in the care plan section) Acute Rehab PT Goals PT Goal Formulation: With patient Time For Goal Achievement: 06/06/17 Potential to Achieve Goals: Fair Progress towards PT goals: Progressing toward goals(goals addressed and updated in POC)    Frequency    Min 2X/week      PT Plan Current plan remains appropriate    Co-evaluation              AM-PAC PT "6 Clicks" Daily Activity  Outcome Measure  Difficulty turning over in bed (including adjusting bedclothes, sheets and blankets)?: Unable Difficulty moving from lying on back to sitting on the side of the  bed? : Unable Difficulty sitting down on and standing up from a chair with arms (e.g., wheelchair, bedside commode, etc,.)?: Unable Help needed moving to and from a bed to chair (including a wheelchair)?: Total Help needed walking in hospital room?: Total Help needed climbing 3-5 steps with a railing? : Total 6 Click Score: 6    End of Session   Activity Tolerance: Patient limited by pain Patient left: in bed;with call bell/phone within reach Nurse Communication: Mobility status;Patient requests pain meds PT Visit Diagnosis: Muscle weakness (generalized) (M62.81);Adult, failure to thrive (R62.7)     Time: 4128-7867 PT Time Calculation (min) (ACUTE ONLY): 27 min  Charges:  $Therapeutic Activity: 23-37 mins                    G Codes:       [name], [name], [name] [phone]    [name] [name] 05/23/2017, 4:06 PM

## 2017-05-24 DIAGNOSIS — J301 Allergic rhinitis due to pollen: Secondary | ICD-10-CM

## 2017-05-24 DIAGNOSIS — L89519 Pressure ulcer of right ankle, unspecified stage: Secondary | ICD-10-CM

## 2017-05-24 DIAGNOSIS — Z1612 Extended spectrum beta lactamase (ESBL) resistance: Secondary | ICD-10-CM

## 2017-05-24 DIAGNOSIS — B9689 Other specified bacterial agents as the cause of diseases classified elsewhere: Secondary | ICD-10-CM

## 2017-05-24 DIAGNOSIS — L0889 Other specified local infections of the skin and subcutaneous tissue: Secondary | ICD-10-CM

## 2017-05-24 LAB — CBC
HEMATOCRIT: 21.3 % — AB (ref 36.0–46.0)
HEMOGLOBIN: 6.9 g/dL — AB (ref 12.0–15.0)
MCH: 28.4 pg (ref 26.0–34.0)
MCHC: 32.4 g/dL (ref 30.0–36.0)
MCV: 87.7 fL (ref 78.0–100.0)
Platelets: 486 10*3/uL — ABNORMAL HIGH (ref 150–400)
RBC: 2.43 MIL/uL — ABNORMAL LOW (ref 3.87–5.11)
RDW: 15.2 % (ref 11.5–15.5)
WBC: 9.1 10*3/uL (ref 4.0–10.5)

## 2017-05-24 LAB — GLUCOSE, CAPILLARY
GLUCOSE-CAPILLARY: 28 mg/dL — AB (ref 65–99)
GLUCOSE-CAPILLARY: 124 mg/dL — AB (ref 65–99)
GLUCOSE-CAPILLARY: 106 mg/dL — AB (ref 65–99)
Glucose-Capillary: 42 mg/dL — CL (ref 65–99)
Glucose-Capillary: 134 mg/dL — ABNORMAL HIGH (ref 65–99)
Glucose-Capillary: 155 mg/dL — ABNORMAL HIGH (ref 65–99)

## 2017-05-24 LAB — BASIC METABOLIC PANEL
Anion gap: 8 (ref 5–15)
BUN: 8 mg/dL (ref 6–20)
CHLORIDE: 107 mmol/L (ref 101–111)
CO2: 21 mmol/L — AB (ref 22–32)
CREATININE: 1.09 mg/dL — AB (ref 0.44–1.00)
Calcium: 8.3 mg/dL — ABNORMAL LOW (ref 8.9–10.3)
GFR calc non Af Amer: 58 mL/min — ABNORMAL LOW (ref >60)
Glucose, Bld: 62 mg/dL — ABNORMAL LOW (ref 65–99)
Potassium: 3.8 mmol/L (ref 3.5–5.1)
Sodium: 136 mmol/L (ref 135–145)

## 2017-05-24 LAB — PREPARE RBC (CROSSMATCH)

## 2017-05-24 LAB — ABO/RH: ABO/RH(D): O POS

## 2017-05-24 MED ORDER — SODIUM CHLORIDE 0.9 % IV SOLN
Freq: Once | INTRAVENOUS | Status: AC
  Administered 2017-05-24: 11:00:00 via INTRAVENOUS

## 2017-05-24 MED ORDER — DEXTROSE 50 % IV SOLN
INTRAVENOUS | Status: AC
  Administered 2017-05-24: 50 mL
  Filled 2017-05-24: qty 50

## 2017-05-24 MED ORDER — INSULIN GLARGINE 100 UNIT/ML ~~LOC~~ SOLN
6.0000 [IU] | Freq: Every day | SUBCUTANEOUS | Status: DC
  Administered 2017-05-24: 6 [IU] via SUBCUTANEOUS
  Filled 2017-05-24 (×2): qty 0.06

## 2017-05-24 MED ORDER — DEXTROSE 50 % IV SOLN
1.0000 | Freq: Once | INTRAVENOUS | Status: AC
  Administered 2017-05-24: 50 mL via INTRAVENOUS

## 2017-05-24 NOTE — Progress Notes (Signed)
Results for JAREN, LOWENTHAL (MRN [id number]) as of 05/24/2017 09:12  Ref. Range 05/23/2017 11:57 05/23/2017 16:59 05/23/2017 21:31 05/24/2017 08:31 05/24/2017 08:54  Glucose-Capillary Latest Ref Range: 65 - 99 mg/dL 961 (H) 164 (H) 353 (H) 42 (LL) 28 (LL)  Noted that patient's blood sugar this am is 42 mg/dl and then down to 28 mg/dl.    Recommend decreasing Lantus to 8 units daily. Titrate dosage as needed. Recommend changing Novolog correction scale to a very sensitive custom scale: 150-200 mg/dl = 1 unit 912-258 mg/dl = 2 units 346-219 mg/dl = 3 units 471-252 mg/dl = 4 units 712-929 mg/dl = 5 units >090 mg/d = call MD  Smith Mince RN BSN CDE Diabetes Coordinator Pager: [phone]  8am-5pm

## 2017-05-24 NOTE — Progress Notes (Signed)
   Subjective:  No acute events overnight. This morning patient was found to have a CBG of 28 (BMP glucose 62). She reports feeling shaky and diaphoretic. Symptoms improve after drinking orange juice. She is otherwise stable and her only complaint today is lower back pain. Discussed with patient we will continue current antibiotic regimen pending susceptibilities. Also discussed will continue to adjust insulin regimen in the setting of AM hypoglycemia and have snacks available for her at all times. Patient verbalized understanding and is in agreement with plan.   Objective:  Vital signs in last 24 hours: Vitals:   05/23/17 0842 05/23/17 1701 05/23/17 2148 05/24/17 0425  BP: 132/66 (!) 123/58 123/61 136/65  Pulse: 75 72 68 74  Resp: 16 16 16 16   Temp: 98.9 F (37.2 C) 99.5 F (37.5 C) 98.9 F (37.2 C) 98.7 F (37.1 C)  TempSrc: Oral Oral Oral Oral  SpO2: 98% 98% 98% 100%  Weight:      Height:       Physical Exam  Constitutional: She is oriented to person, place, and time.  Chronically-ill appearing female with depressed affect. Appears uncomfortable due to pain, otherwise in no acute distress   Cardiovascular: Normal rate and regular rhythm.  Murmur (II/VI systolic murmur best heard at LUS unchanged ) heard. Pulmonary/Chest: Effort normal. No respiratory distress.  Abdominal: Soft. Bowel sounds are normal. She exhibits no distension. There is no tenderness.  Musculoskeletal: She exhibits no edema.  Neurological: She is alert and oriented to person, place, and time.  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 Wound cx positive Enterococcus fecalis, awaiting susceptibilities. She remains afebrile with Tmax 99.8 and hemodynamically stable.  ID recommended continuing meropenem pending susceptibilities. PICC line placed 3/26.  We will add daptomycin if patient decompensates clinically.  Will continue to follow up for susceptibilities.  -Surgery andID following, appreciate recommendations -Continue meropenem - We will add daptomycin if patient decompensates - Follow up culture sensitivities -Pain management with oxy IR10 q6h PRNandIVdilaudid0.5-1mg  q2h PRNfor breakthrough pain with holding parameters   #VRE and proteus bacteremia:Resolved.S/p 5 day of abx therapy with daptomycin and CTX>keflex. Repeat BCx have been neg x5d final. TTE neg for vegetations.   #T1DM:Patient continues to have episodes of hypoglycemia in AM in the setting of inconsistent PO intake as well recent weight loss during prior hospitalization. She did not receive short acting insulin yesterday. CBG this AM 62--> 28.  -Lantus 12-->6 units qhs - Novolog 3 units tid, only if eats >50% of meal - SSI-S + CBG monitoring  - Patient to have snacks available to her at all times   #MS: No evidence of flare currently. Will continue monitoring. - PT/OT for continued rehabilitation - Continue home muscle relaxants  Dispo: Anticipated discharge in approximately 2-4 day(s).   [name], MD 05/24/2017, 6:22 AM Pager: [phone]

## 2017-05-24 NOTE — Progress Notes (Signed)
[facility] Progress Note  3 Days Post-Op  Subjective: CC-  No new complaints. Currently undergoing hydrotherapy.  Objective: Vital signs in last 24 hours: Temp:  [98.4 F (36.9 C)-99.5 F (37.5 C)] 98.4 F (36.9 C) (03/28 0936) Pulse Rate:  [66-74] 66 (03/28 0936) Resp:  [16] 16 (03/28 0936) BP: (123-146)/(58-68) 146/68 (03/28 0936) SpO2:  [98 %-100 %] 100 % (03/28 0936) Last BM Date: 05/22/17  Intake/Output from previous day: 03/27 0701 - 03/28 0700 In: 1380.8 [P.O.:720; I.V.:560.8; IV Piggyback:100] Out: 1200 [Urine:1200] Intake/Output this shift: Total I/O In: 1080 [P.O.:1080] Out: 600 [Urine:600]  PE: Gen: Alert, NAD, depressed affect HEENT: EOM's intact, pupils equal and round Pulm:effort normal GU: sacral wound with some yellow slough at the base, edges cleaning up well, trace purulent drainage around penrose, during hydrotherapy there was some purulent drainage noted coming from superior aspect of the wound, I probed and did not get into any pocket and could not find any more purulent drainage  Lab Results:  Recent Labs    05/23/17 0445 05/24/17 0455  WBC 10.7* 9.1  HGB 7.1* 6.9*  HCT 21.6* 21.3*  PLT 486* 486*   BMET Recent Labs    05/23/17 0445 05/24/17 0455  NA 135 136  K 4.0 3.8  CL 105 107  CO2 20* 21*  GLUCOSE 80 62*  BUN 10 8  CREATININE 1.11* 1.09*  CALCIUM 8.6* 8.3*   PT/INR No results for input(s): LABPROT, INR in the last 72 hours. CMP     Component Value Date/Time   NA 136 05/24/2017 0455   NA 135 04/21/2015 0956   K 3.8 05/24/2017 0455   CL 107 05/24/2017 0455   CO2 21 (L) 05/24/2017 0455   GLUCOSE 62 (L) 05/24/2017 0455   BUN 8 05/24/2017 0455   BUN 11 04/21/2015 0956   CREATININE 1.09 (H) 05/24/2017 0455   CREATININE 0.79 08/28/2014 1223   CALCIUM 8.3 (L) 05/24/2017 0455   PROT 5.7 (L) 05/05/2017 0220   PROT 6.4 04/21/2015 0956   ALBUMIN 2.2 (L) 05/05/2017 0220   ALBUMIN 3.6 04/21/2015 0956   AST  14 (L) 05/05/2017 0220   ALT 12 (L) 05/05/2017 0220   ALKPHOS 92 05/05/2017 0220   BILITOT 0.4 05/05/2017 0220   BILITOT <0.2 04/21/2015 0956   GFRNONAA 58 (L) 05/24/2017 0455   GFRNONAA 89 08/28/2014 1223   GFRAA >60 05/24/2017 0455   GFRAA >89 08/28/2014 1223   Lipase  No results found for: LIPASE     Studies/Results: [place] Ekg Site Rite  Result Date: 05/22/2017 If Site Rite image not attached, placement could not be confirmed due to current cardiac rhythm.   Anti-infectives: Anti-infectives (From admission, onward)   Start     Dose/Rate Route Frequency Ordered Stop   05/18/17 1030  meropenem (MERREM) 1 g in sodium chloride 0.9 % 100 mL IVPB     1 g 200 mL/hr over 30 Minutes Intravenous Every 12 hours 05/18/17 0955     05/08/17 2000  DAPTOmycin (CUBICIN) 600 mg in sodium chloride 0.9 % IVPB  Status:  Discontinued     600 mg 224 mL/hr over 30 Minutes Intravenous Every 24 hours 05/08/17 1049 05/10/17 1004   05/08/17 1200  cephALEXin (KEFLEX) capsule 250 mg  Status:  Discontinued     250 mg Oral Every 6 hours 05/08/17 1049 05/10/17 1004   05/06/17 0730  vancomycin (VANCOCIN) 500 mg in sodium chloride 0.9 % 100 mL IVPB  Status:  Discontinued  500 mg 100 mL/hr over 60 Minutes Intravenous Every 12 hours 05/05/17 1924 05/05/17 1956   05/05/17 2000  DAPTOmycin (CUBICIN) 600 mg in sodium chloride 0.9 % IVPB  Status:  Discontinued     600 mg 224 mL/hr over 30 Minutes Intravenous Every 24 hours 05/05/17 1956 05/08/17 1032   05/05/17 1930  cefTRIAXone (ROCEPHIN) 2 g in sodium chloride 0.9 % 100 mL IVPB  Status:  Discontinued     2 g 200 mL/hr over 30 Minutes Intravenous Every 24 hours 05/05/17 1922 05/08/17 1032   05/05/17 1930  vancomycin (VANCOCIN) IVPB 1000 mg/200 mL premix  Status:  Discontinued     1,000 mg 200 mL/hr over 60 Minutes Intravenous  Once 05/05/17 1924 05/05/17 1956       Assessment/Plan Type Idiabetes MS Bedbound Neurogenic bladder with chronicindwelling  foley HTN CKD stage III  Sacral decubitus ulcer, right gluteus abscess S/pDebridement sacral decubitus ulcerandIncision and drainage right gluteus abscess3/25 Dr. Derrell Lolling - POD 3 - culture showing MODERATE ENTEROCOCCUS FAECALI, report pending - WBC trending down 9.1, TMAX 99.5 - abx per ID  ID -currently on merrem 3/22>> FEN -carb modified diet VTE -SCDs, heparin Foley -in place Follow up - Dr. Derrell Lolling  Plan- Continue BID wet to dry dressing changes with Santyl, pack superiorly into area that tunnels. Continue hydrotherapy. Drain will stay in place. Abx per ID, follow intraoperative cultures.   LOS: 19 days    Debra [name] , [facility] Surgery 05/24/2017, 10:40 AM Pager: [phone] Consults: [phone] Mon-Fri 7:00 am-4:30 pm Sat-Sun 7:00 am-11:30 am

## 2017-05-24 NOTE — Clinical Social Work Note (Signed)
CSW continuing to monitor patient's progress. Reviewed resident's note for today and patient's "Anticipated discharge in approximately2-4days".  CSW will facilitate discharge back to [facility] when medically stable.  Genelle Bal, MSW, LCSW Licensed Clinical Social Worker Clinical Social Work Department [employer] [phone]

## 2017-05-24 NOTE — Progress Notes (Signed)
Patient ID: Debra [name], female   DOB: 08/21/1965, 52 y.o.   MRN: [id number]         [facility] for Infectious Disease    Date of Admission:  05/05/2017   Day 7 meropenem         She remains afebrile.  Sacral abscess cultures are now growing VRE.  This is probably Enterococcus faecalis as it remains ampicillin susceptible.  Therefore we do not need to add daptomycin.  Meropenem should be effective against this isolate and also provide ongoing coverage for gram-negative rods including ESBL producing gram-negative rods and anaerobes.  I recommend 5 more weeks of IV meropenem therapy.  In order to have the best chance of healing this wound she will also need aggressive attention to wound care, offloading of pressure and good nutrition.  Plan: 1. Continue meropenem for 5 more weeks via PICC 2. 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 Penicillins Anaphylaxis, Nausea And Vomiting and Rash    Has patient had a PCN reaction causing immediate rash, facial/tongue/throat swelling, SOB or lightheadedness with hypotension: Yes Has patient had a PCN reaction causing severe rash involving mucus membranes or skin necrosis: Yes Has patient had a PCN reaction that required hospitalization No Has patient had a PCN reaction occurring within the last 10 years: Yes If all of the above answers are "NO", then may proceed with Cephalosporin use.   . Pollen Extract Other (See Comments)    Seasonal Allergies  . Tape Rash    OPAT Orders Discharge antibiotics: Per pharmacy protocol meropenem  Duration: 6 weeks End Date: 06/28/2017  [facility] Care Per Protocol:  Labs weekly while on IV antibiotics: _x_ CBC with differential _x_ BMP __ CMP _x_ CRP _x_ ESR __ Vancomycin trough  _x_ Please pull PIC at completion of IV antibiotics __ Please leave PIC in place  until doctor has seen patient or been notified          Michel Bickers, [place] [facility] [place] [phone] pager   [phone] cell 05/24/2017, 3:26 PM

## 2017-05-24 NOTE — Progress Notes (Signed)
Physical Therapy Wound Treatment Patient Details  Name: Debra [name] MRN: [id number] Date of Birth: 1965/04/22  Today's Date: 05/24/2017 Time: 8768-1157 Time Calculation (min): 40 min  Subjective  Subjective: Pt very quiet again today stating she is having more pain. Patient and Family Stated Goals: have wound heal and pain decrease  Pain Score: Pt premedicated prior to treatment. Pt with complaint of generalized pain on entry.   Wound Assessment  Pressure Injury 05/07/17 Unstageable - Full thickness tissue loss in which the base of the ulcer is covered by slough (yellow, tan, gray, green or brown) and/or eschar (tan, brown or black) in the wound bed. Sacral Wound to R of midline (Active)  Dressing Type ABD;Moist to dry;Barrier Film (skin prep) 05/24/2017  1:00 PM  Dressing Changed;Clean;Dry;Intact 05/24/2017  1:00 PM  Dressing Change Frequency Daily 05/24/2017  1:00 PM  State of Healing Early/partial granulation 05/24/2017  1:00 PM  Site / Wound Assessment Yellow;Red;Granulation tissue 05/24/2017  1:00 PM  % Wound base Red or Granulating 45% 05/24/2017  1:00 PM  % Wound base Yellow/Fibrinous Exudate 65% 05/24/2017  1:00 PM  % Wound base Black/Eschar 0% 05/24/2017  1:00 PM  % Wound base Other/Granulation Tissue (Comment) 0% 05/24/2017  1:00 PM  Peri-wound Assessment Erythema (blanchable);Pink 05/24/2017  1:00 PM  Wound Length (cm) 10 cm 05/23/2017  5:00 PM  Wound Width (cm) 6.5 cm 05/23/2017  5:00 PM  Wound Depth (cm) 3 cm 05/23/2017  5:00 PM  Wound Surface Area (cm^2) 65 cm^2 05/23/2017  5:00 PM  Wound Volume (cm^3) 195 cm^3 05/23/2017  5:00 PM  Tunneling (cm) 3 cm 1 o'clock  05/16/2017  4:00 PM  Undermining (cm) 12-1 o'clock 1 cm, 1-2 o'clock 2.5 cm 2-3 o'clock 1 cm, 4-6 o'clock drains in place undermining approx 3 cm  05/23/2017  5:00 PM  Margins Unattached edges (unapproximated) 05/24/2017  1:00 PM  Drainage Amount Moderate 05/24/2017  1:00 PM  Drainage Description Serosanguineous 05/24/2017   1:00 PM  Treatment Cleansed;Debridement (Selective);Hydrotherapy (Pulse lavage);Packing (Saline gauze) 05/24/2017  1:00 PM   santyl applied to necrotic areas of wound bed    Hydrotherapy Pulsed lavage therapy - wound location: sacrum Pulsed Lavage with Suction (psi): 8 psi(8-12) Pulsed Lavage with Suction - Normal Saline Used: 1000 mL Pulsed Lavage Tip: Tip with splash shield Selective Debridement Selective Debridement - Location: Sacrum Selective Debridement - Tools Used: Forceps;Scissors Selective Debridement - Tissue Removed: white/yellow fibrous necrotic tissue in proximal border of wound   Wound Assessment and Plan  Wound Therapy - Assess/Plan/Recommendations Wound Therapy - Clinical Statement: Pt continues to have undermining from 12-3 o'clock, and very hard periwound, with selective debidement in the area pus began to drain, slight pressure to periwound produced more drainage. Santyl slathered gauze used to pack void to aide in cleaning out necrotic tissue to aide in healing.  Factors Delaying/Impairing Wound Healing: Diabetes Mellitus;Multiple medical problems;Immobility Hydrotherapy Plan: Debridement;Dressing change;Pulsatile lavage with suction;Patient/family education Wound Therapy - Frequency: 6X / week Wound Therapy - Current Recommendations: Surgery consult Wound Therapy - Follow Up Recommendations: Skilled nursing facility Wound Plan: see above  Wound Therapy Goals- Improve the function of patient's integumentary system by progressing the wound(s) through the phases of wound healing (inflammation - proliferation - remodeling) by: Decrease Necrotic Tissue to: 20 Decrease Necrotic Tissue - Progress: Progressing toward goal Increase Granulation Tissue to: 80 Increase Granulation Tissue - Progress: Progressing toward goal  Goals will be updated until maximal potential achieved or discharge criteria met.  Discharge criteria:  when goals achieved, discharge from hospital, MD  decision/surgical intervention, no progress towards goals, refusal/missing three consecutive treatments without notification or medical reason.  GP    [name]. [name], DPT Acute Rehabilitation  [phone] Pager [phone]   [place] 05/24/2017, 1:43 PM

## 2017-05-24 NOTE — Progress Notes (Signed)
Patients CBG initially 47. Patient drank 4 juices. Recheck showed CBG to be 28. 1 Ampule D50% given. MD paged.

## 2017-05-25 DIAGNOSIS — D649 Anemia, unspecified: Secondary | ICD-10-CM

## 2017-05-25 DIAGNOSIS — Z9889 Other specified postprocedural states: Secondary | ICD-10-CM

## 2017-05-25 DIAGNOSIS — D631 Anemia in chronic kidney disease: Secondary | ICD-10-CM

## 2017-05-25 DIAGNOSIS — N183 Chronic kidney disease, stage 3 (moderate): Secondary | ICD-10-CM

## 2017-05-25 DIAGNOSIS — E1022 Type 1 diabetes mellitus with diabetic chronic kidney disease: Secondary | ICD-10-CM

## 2017-05-25 LAB — TYPE AND SCREEN
ABO/RH(D): O POS
Antibody Screen: NEGATIVE
Unit division: 0

## 2017-05-25 LAB — BASIC METABOLIC PANEL
Anion gap: 8 (ref 5–15)
BUN: 7 mg/dL (ref 6–20)
CHLORIDE: 106 mmol/L (ref 101–111)
CO2: 22 mmol/L (ref 22–32)
Calcium: 8.4 mg/dL — ABNORMAL LOW (ref 8.9–10.3)
Creatinine, Ser: 1.04 mg/dL — ABNORMAL HIGH (ref 0.44–1.00)
GFR calc Af Amer: >60 mL/min (ref >60)
GFR calc non Af Amer: >60 mL/min (ref >60)
Glucose, Bld: 97 mg/dL (ref 65–99)
POTASSIUM: 4 mmol/L (ref 3.5–5.1)
SODIUM: 136 mmol/L (ref 135–145)

## 2017-05-25 LAB — CBC
HEMATOCRIT: 26.3 % — AB (ref 36.0–46.0)
HEMOGLOBIN: 8.3 g/dL — AB (ref 12.0–15.0)
MCH: 26.5 pg (ref 26.0–34.0)
MCHC: 31.6 g/dL (ref 30.0–36.0)
MCV: 84 fL (ref 78.0–100.0)
Platelets: 477 10*3/uL — ABNORMAL HIGH (ref 150–400)
RBC: 3.13 MIL/uL — AB (ref 3.87–5.11)
RDW: 17.1 % — ABNORMAL HIGH (ref 11.5–15.5)
WBC: 8.8 10*3/uL (ref 4.0–10.5)

## 2017-05-25 LAB — BPAM RBC
Blood Product Expiration Date: 201904242359
ISSUE DATE / TIME: 201903281105
UNIT TYPE AND RH: 5100

## 2017-05-25 LAB — GLUCOSE, CAPILLARY
GLUCOSE-CAPILLARY: 93 mg/dL (ref 65–99)
Glucose-Capillary: 114 mg/dL — ABNORMAL HIGH (ref 65–99)
Glucose-Capillary: 111 mg/dL — ABNORMAL HIGH (ref 65–99)

## 2017-05-25 MED ORDER — HEPARIN SOD (PORK) LOCK FLUSH 100 UNIT/ML IV SOLN
250.0000 [IU] | INTRAVENOUS | Status: AC | PRN
  Administered 2017-05-25: 250 [IU]

## 2017-05-25 MED ORDER — OXYCODONE HCL 10 MG PO TABS: 10.0000 mg | ORAL_TABLET | Freq: Four times a day (QID) | ORAL | 0 refills | Status: DC | PRN

## 2017-05-25 MED ORDER — INSULIN GLARGINE 100 UNIT/ML ~~LOC~~ SOLN: 6.0000 [IU] | Freq: Every day | SUBCUTANEOUS | 0 refills | Status: DC

## 2017-05-25 MED ORDER — MEROPENEM IV (FOR PTA / DISCHARGE USE ONLY): 1.0000 g | Freq: Two times a day (BID) | INTRAVENOUS | 0 refills | Status: DC

## 2017-05-25 MED ORDER — HYDROMORPHONE HCL 1 MG/ML IJ SOLN: 0.5000 mg | INTRAMUSCULAR | 0 refills | Status: DC | PRN

## 2017-05-25 MED ORDER — COLLAGENASE 250 UNIT/GM EX OINT: TOPICAL_OINTMENT | Freq: Two times a day (BID) | CUTANEOUS | 0 refills | Status: DC

## 2017-05-25 NOTE — Progress Notes (Signed)
   Subjective:  No acute events overnight. Patient states her pain is better controlled this morning and rates it a 6/10. Discussed with patient she is medically stable for discharge and will require long term IV antibiotics as well as continuous monitoring of her sacral wound. She is concerned about going back to SNF as she does not feel she receives appropriate care.     Objective:  Vital signs in last 24 hours: Vitals:   05/24/17 1210 05/24/17 1353 05/24/17 2049 05/25/17 0442  BP: (!) 146/55 (!) 152/71 133/64 134/63  Pulse: 72 72 74 78  Resp: 17 18 14 13   Temp: 98.3 F (36.8 C) 97.6 F (36.4 C) 98.6 F (37 C) 98.6 F (37 C)  TempSrc: Oral Oral    SpO2: 100% 100% 98% 99%  Weight:      Height:       Physical Exam  Constitutional: She is oriented to person, place, and time.  Chronically ill appearing female sleeping in bed in no acute distress   Cardiovascular: Normal rate and regular rhythm. Exam reveals no gallop and no friction rub.  Murmur (II/ VI systolic murmur best heard at LUSB unchanged from yesterday) heard. Pulmonary/Chest: Effort normal. No respiratory distress.  Abdominal: Soft. Bowel sounds are normal. She exhibits no distension. There is no tenderness.  Musculoskeletal: She exhibits no edema.  Neurological: She is alert and oriented to person, place, and time.  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 Woundu cx positive VRE. She  remains afebrile d hemodynamically stable.IDrecommended continuing meropenem x 5 weeks. PICC line placed 3/26. -Continue meropenem x 5 weeks  -Pain management with oxy IR10 q6h PRNandIVdilaudid0.5-1mg  q2h PRNfor breakthrough pain with holding parameters  - Anticipate discharge today   #VRE and proteus bacteremia:Resolved.S/p 5 day of abx therapy with daptomycin and CTX>keflex. Repeat BCx have been neg x5d final. TTE neg for vegetations.   #T1DM:No hypoglycemia episodes yesterday. Will continue current regimen as below.  -Lantus 6 units qhs -Novolog 2units tid, only if eats >50% of meal - SSI-S + CBG monitoring  - Patient to have snacks available to her at all times   #MS: No evidence of flare currently. Will continue monitoring. - PT/OT for continued rehabilitation - Continue home muscle relaxants   Dispo: Anticipated discharge today.   [name], MD 05/25/2017, 6:07 AM Pager: [phone]

## 2017-05-25 NOTE — Progress Notes (Signed)
Report given to [facility]. Waiting for PTAR for transport at [facility]

## 2017-05-25 NOTE — Progress Notes (Signed)
PHARMACY CONSULT NOTE FOR:  OUTPATIENT  PARENTERAL ANTIBIOTIC THERAPY (OPAT)  Indication: Infected sacral decubitus with abscess and coccygeal osteomyelitis Regimen: Meropenem 1g IV every 12 hours End date: 06/28/17  IV antibiotic discharge orders are pended. To discharging provider:  please sign these orders via discharge navigator,  Select New Orders & click on the button choice - Manage This Unsigned Work.     Thank you for allowing pharmacy to be a part of this patient's care.  Rolley Sims 05/25/2017, 1:24 PM

## 2017-05-25 NOTE — Clinical Social Work Note (Signed)
Patient medically stable for discharge back to [facility] today. Admissions director at SNF advised and d/c clinicals transmitted to facility. Contact made with Ms. Ratliff [phone]) and informed her of discharge. CSW signing off as no other SW intervention services needed at this time.  Genelle Bal, MSW, LCSW Licensed Clinical Social Worker Clinical Social Work Department [employer] [phone]

## 2017-05-25 NOTE — Progress Notes (Signed)
[facility] Progress Note  4 Days Post-Op  Subjective: CC-  No new complaints. Patient states that she continues to have pain at sacrum. Currently lying on her left side, just completed hydrotherapy.  Objective: Vital signs in last 24 hours: Temp:  [97.6 F (36.4 C)-98.6 F (37 C)] 98.1 F (36.7 C) (03/29 1000) Pulse Rate:  [72-79] 79 (03/29 1000) Resp:  [13-18] 16 (03/29 1000) BP: (133-153)/(55-71) 153/68 (03/29 1000) SpO2:  [98 %-100 %] 99 % (03/29 1000) Last BM Date: 05/22/17  Intake/Output from previous day: 03/28 0701 - 03/29 0700 In: 3575.8 [P.O.:1860; I.V.:985.8; Blood:630; IV Piggyback:100] Out: 2175 [Urine:2175] Intake/Output this shift: Total I/O In: 240 [P.O.:240] Out: 0   PE: Gen: Alert, NAD,depressed affect HEENT: EOM's intact, pupils equal and round Pulm:effort normal GU: less slough at base, no active purulent drainage     Lab Results:  Recent Labs    05/24/17 0455 05/25/17 0439  WBC 9.1 8.8  HGB 6.9* 8.3*  HCT 21.3* 26.3*  PLT 486* 477*   BMET Recent Labs    05/24/17 0455 05/25/17 0439  NA 136 136  K 3.8 4.0  CL 107 106  CO2 21* 22  GLUCOSE 62* 97  BUN 8 7  CREATININE 1.09* 1.04*  CALCIUM 8.3* 8.4*   PT/INR No results for input(s): LABPROT, INR in the last 72 hours. CMP     Component Value Date/Time   NA 136 05/25/2017 0439   NA 135 04/21/2015 0956   K 4.0 05/25/2017 0439   CL 106 05/25/2017 0439   CO2 22 05/25/2017 0439   GLUCOSE 97 05/25/2017 0439   BUN 7 05/25/2017 0439   BUN 11 04/21/2015 0956   CREATININE 1.04 (H) 05/25/2017 0439   CREATININE 0.79 08/28/2014 1223   CALCIUM 8.4 (L) 05/25/2017 0439   PROT 5.7 (L) 05/05/2017 0220   PROT 6.4 04/21/2015 0956   ALBUMIN 2.2 (L) 05/05/2017 0220   ALBUMIN 3.6 04/21/2015 0956   AST 14 (L) 05/05/2017 0220   ALT 12 (L) 05/05/2017 0220   ALKPHOS 92 05/05/2017 0220   BILITOT 0.4 05/05/2017 0220   BILITOT <0.2 04/21/2015 0956   GFRNONAA >60 05/25/2017 0439    GFRNONAA 89 08/28/2014 1223   GFRAA >60 05/25/2017 0439   GFRAA >89 08/28/2014 1223   Lipase  No results found for: LIPASE     Studies/Results: No results found.  Anti-infectives: Anti-infectives (From admission, onward)   Start     Dose/Rate Route Frequency Ordered Stop   05/18/17 1030  meropenem (MERREM) 1 g in sodium chloride 0.9 % 100 mL IVPB     1 g 200 mL/hr over 30 Minutes Intravenous Every 12 hours 05/18/17 0955     05/08/17 2000  DAPTOmycin (CUBICIN) 600 mg in sodium chloride 0.9 % IVPB  Status:  Discontinued     600 mg 224 mL/hr over 30 Minutes Intravenous Every 24 hours 05/08/17 1049 05/10/17 1004   05/08/17 1200  cephALEXin (KEFLEX) capsule 250 mg  Status:  Discontinued     250 mg Oral Every 6 hours 05/08/17 1049 05/10/17 1004   05/06/17 0730  vancomycin (VANCOCIN) 500 mg in sodium chloride 0.9 % 100 mL IVPB  Status:  Discontinued     500 mg 100 mL/hr over 60 Minutes Intravenous Every 12 hours 05/05/17 1924 05/05/17 1956   05/05/17 2000  DAPTOmycin (CUBICIN) 600 mg in sodium chloride 0.9 % IVPB  Status:  Discontinued     600 mg 224 mL/hr over 30 Minutes  Intravenous Every 24 hours 05/05/17 1956 05/08/17 1032   05/05/17 1930  cefTRIAXone (ROCEPHIN) 2 g in sodium chloride 0.9 % 100 mL IVPB  Status:  Discontinued     2 g 200 mL/hr over 30 Minutes Intravenous Every 24 hours 05/05/17 1922 05/08/17 1032   05/05/17 1930  vancomycin (VANCOCIN) IVPB 1000 mg/200 mL premix  Status:  Discontinued     1,000 mg 200 mL/hr over 60 Minutes Intravenous  Once 05/05/17 1924 05/05/17 1956       Assessment/Plan Type Idiabetes MS Bedbound Neurogenic bladder with chronicindwelling foley HTN CKD stage III  Sacral decubitus ulcer, right gluteus abscess S/pDebridement sacral decubitus ulcerandIncision and drainage right gluteus abscess3/25 Dr. Derrell Lolling - POD4 - culture showing MODERATE ENTEROCOCCUS FAECALI, report pending - WBC trending down8.8, afebrile - abx per  ID  ID -currently on merrem 3/22>> FEN -carb modified diet VTE -SCDs, heparin Foley -in place Follow up - Dr. Derrell Lolling  Plan-Wound appears to be cleaning up well. Continue BID wetto dry dressing changes with Santyl. Continuehydrotherapy. Drain will stay in place for several days.Abx per ID, follow intraoperative cultures.   LOS: 20 days    Franne Forts , [facility] 05/25/2017, 11:16 AM Pager: [phone] Consults: [phone] Mon-Fri 7:00 am-4:30 pm Sat-Sun 7:00 am-11:30 am

## 2017-05-25 NOTE — Progress Notes (Signed)
Physical Therapy Wound Treatment Patient Details  Name: Debra [name] MRN: [id number] Date of Birth: 07/29/1965  Today's Date: 05/25/2017 Time: 3244-0102 Time Calculation (min): 37 min  Subjective  Subjective: Pt not interactive today Patient and Family Stated Goals: have wound heal and pain decrease  Pain Score: Pt premedicated prior to treatment, no complaints of pain during treatment.   Wound Assessment  Pressure Injury 05/07/17 Unstageable - Full thickness tissue loss in which the base of the ulcer is covered by slough (yellow, tan, gray, green or brown) and/or eschar (tan, brown or black) in the wound bed. Sacral Wound to R of midline (Active)  Dressing Type ABD;Moist to dry;Barrier Film (skin prep) 05/25/2017  4:00 PM  Dressing Changed;Clean;Dry;Intact 05/25/2017  4:00 PM  Dressing Change Frequency Daily 05/25/2017  4:00 PM  State of Healing Early/partial granulation 05/25/2017  4:00 PM  Site / Wound Assessment Yellow;Red;Granulation tissue 05/25/2017  4:00 PM  % Wound base Red or Granulating 50% 05/25/2017  4:00 PM  % Wound base Yellow/Fibrinous Exudate 50% 05/25/2017  4:00 PM  % Wound base Black/Eschar 0% 05/25/2017  4:00 PM  % Wound base Other/Granulation Tissue (Comment) 0% 05/25/2017  4:00 PM  Peri-wound Assessment Erythema (blanchable);Pink 05/25/2017  4:00 PM  Wound Length (cm) 10 cm 05/23/2017  5:00 PM  Wound Width (cm) 6.5 cm 05/23/2017  5:00 PM  Wound Depth (cm) 3 cm 05/23/2017  5:00 PM  Wound Surface Area (cm^2) 65 cm^2 05/23/2017  5:00 PM  Wound Volume (cm^3) 195 cm^3 05/23/2017  5:00 PM  Tunneling (cm) 3 cm 1 o'clock  05/16/2017  4:00 PM  Undermining (cm) 12-1 o'clock 1 cm, 1-2 o'clock 2.5 cm 2-3 o'clock 1 cm, 4-6 o'clock drains in place undermining approx 3 cm  05/23/2017  5:00 PM  Margins Unattached edges (unapproximated) 05/25/2017  4:00 PM  Drainage Amount Moderate 05/25/2017  4:00 PM  Drainage Description Serosanguineous 05/25/2017  4:00 PM  Treatment Debridement  (Selective);Hydrotherapy (Pulse lavage);Packing (Saline gauze) 05/25/2017  4:00 PM   Hydrotherapy Pulsed lavage therapy - wound location: sacrum Pulsed Lavage with Suction (psi): 8 psi(8-12) Pulsed Lavage with Suction - Normal Saline Used: 1000 mL Pulsed Lavage Tip: Tip with splash shield Selective Debridement Selective Debridement - Location: Sacrum Selective Debridement - Tools Used: Forceps;Scissors Selective Debridement - Tissue Removed: white/yellow fibrous necrotic tissue in proximal border of wound   Wound Assessment and Plan  Wound Therapy - Assess/Plan/Recommendations Wound Therapy - Clinical Statement: Able to remove more necrotic material from proximal end of wound undermined, with minimal amount of purulent drainage in area, continued purulent drainage from drains at distal end of wound.  Factors Delaying/Impairing Wound Healing: Diabetes Mellitus;Multiple medical problems;Immobility Hydrotherapy Plan: Debridement;Dressing change;Pulsatile lavage with suction;Patient/family education Wound Therapy - Frequency: 6X / week Wound Therapy - Current Recommendations: Surgery consult Wound Therapy - Follow Up Recommendations: Skilled nursing facility Wound Plan: see above  Wound Therapy Goals- Improve the function of patient's integumentary system by progressing the wound(s) through the phases of wound healing (inflammation - proliferation - remodeling) by: Decrease Necrotic Tissue to: 20 Decrease Necrotic Tissue - Progress: Progressing toward goal Increase Granulation Tissue to: 80 Increase Granulation Tissue - Progress: Progressing toward goal Goals/treatment plan/discharge plan were made with and agreed upon by patient/family: Yes Time For Goal Achievement: 7 days Wound Therapy - Potential for Goals: Fair  Goals will be updated until maximal potential achieved or discharge criteria met.  Discharge criteria: when goals achieved, discharge from hospital, MD decision/surgical  intervention, no progress  towards goals, refusal/missing three consecutive treatments without notification or medical reason.  GP    [name]. [name], DPT Acute Rehabilitation  [phone] Pager [phone]   [employer] 05/25/2017, 4:46 PM

## 2017-05-25 NOTE — Discharge Summary (Addendum)
Name: Debra [name] MRN: [id number] DOB: Sep 26, 1965 52 y.o. PCP: Aldine Contes, MD  Date of Admission: 05/05/2017  2:09 AM Date of Discharge: 05/25/2017 Attending Physician: Lucious Groves, DO  Discharge Diagnosis: 1. Hyperglycemia  2. VRE and Proteus bacteremia  3. Osteomyelitis of coccyx  4. Multiple abscess in gluteal muscles  5. T1DM  6. Multiple sclerosis  7. Normocytic anemia  8. CKD Stage III   Principal Problem:   Pressure injury of skin Active Problems:   Type 1 diabetes mellitus (HCC)   Multiple sclerosis (HCC)   Neurogenic bladder   Hyperglycemia   Bacteriuria   Bacteremia due to vancomycin resistant Enterococcus   Proteus infection   Foley catheter in place on admission   Osteomyelitis of coccyx ([place])   Decubitus ulcer of coccygeal region, stage IV ([place])   Normocytic anemia   Discharge Medications: Allergies as of 05/25/2017      Reactions   Penicillins Anaphylaxis, Nausea And Vomiting, Rash   Has patient had a PCN reaction causing immediate rash, facial/tongue/throat swelling, SOB or lightheadedness with hypotension: Yes Has patient had a PCN reaction causing severe rash involving mucus membranes or skin necrosis: Yes Has patient had a PCN reaction that required hospitalization No Has patient had a PCN reaction occurring within the last 10 years: Yes If all of the above answers are "NO", then may proceed with Cephalosporin use.   Pollen Extract Other (See Comments)   Seasonal Allergies   Tape Rash      Medication List    STOP taking these medications   meclizine 12.5 MG tablet Commonly known as:  ANTIVERT   traMADol 50 MG tablet Commonly known as:  ULTRAM     TAKE these medications   acetaminophen 325 MG tablet Commonly known as:  TYLENOL Take 650 mg by mouth every 4 (four) hours as needed for mild pain or fever.   amLODipine 10 MG tablet Commonly known as:  NORVASC Take 1 tablet (10 mg total) by mouth daily.   aspirin EC 81 MG  tablet Take 1 tablet (81 mg total) by mouth daily.   atorvastatin 40 MG tablet Commonly known as:  LIPITOR Take 1 tablet (40 mg total) by mouth daily.   Baclofen 5 MG Tabs Take 5 mg by mouth 3 (three) times daily.   bethanechol 50 MG tablet Commonly known as:  URECHOLINE Take 1 tablet (50 mg total) by mouth 3 (three) times daily.   collagenase ointment Commonly known as:  SANTYL Apply topically 2 (two) times daily.   feeding supplement (GLUCERNA SHAKE) Liqd Take 237 mLs by mouth 2 (two) times daily between meals.   GLUCAGON HCL (RDNA) IJ Inject 1 mg into the muscle as needed (hypoglycemia-administer for CBG <60).   insulin aspart 100 UNIT/ML injection Commonly known as:  novoLOG Inject 2-10 Units into the skin 3 (three) times daily before meals. 150-200=2 units 201-250=4 units 241-300=6 units 301-350=8 units 351-400=10 units   insulin glargine 100 UNIT/ML injection Commonly known as:  LANTUS Inject 0.06 mLs (6 Units total) into the skin daily. What changed:  how much to take   lidocaine 5 % Commonly known as:  LIDODERM Place 1 patch onto the skin daily. FOR LOWER BACK PAIN Remove & Discard patch within 12 hours or as directed by MD   lisinopril 20 MG tablet Commonly known as:  PRINIVIL,ZESTRIL Take 1 tablet (20 mg total) by mouth daily.   loperamide 2 MG capsule Commonly known as:  IMODIUM Take 2  capsules (4 mg total) by mouth as needed for diarrhea or loose stools.   magnesium hydroxide 400 MG/5ML suspension Commonly known as:  MILK OF MAGNESIA Take 30 mLs by mouth daily as needed for mild constipation.   meropenem IVPB Commonly known as:  MERREM Inject 1 g into the vein every 12 (twelve) hours. Indication:  Osteomyelitis Last Day of Therapy:  06/28/17 Labs - Once weekly:  CBC/D and BMP, Labs - Every other week:  ESR and CRP   metoprolol succinate 100 MG 24 hr tablet Commonly known as:  TOPROL-XL Take 1 tablet (100 mg total) by mouth daily. Take with or  immediately following a meal.   mirtazapine 45 MG tablet Commonly known as:  REMERON Take 1 tablet (45 mg total) by mouth at bedtime.   ondansetron 4 MG tablet Commonly known as:  ZOFRAN Take 4 mg by mouth daily.   Oxycodone HCl 10 MG Tabs Take 1 tablet (10 mg total) by mouth every 6 (six) hours as needed (PRN for post-op pain.). What changed:    medication strength  how much to take  when to take this  reasons to take this   pantoprazole 40 MG tablet Commonly known as:  PROTONIX Take 1 tablet (40 mg total) by mouth daily.   polyethylene glycol packet Commonly known as:  MIRALAX / GLYCOLAX Take 17 g by mouth daily as needed for mild constipation, moderate constipation or severe constipation.   protein supplement shake Liqd Commonly known as:  PREMIER PROTEIN Take 325 mLs (11 oz total) by mouth 2 (two) times daily between meals.   risperiDONE 1 MG tablet Commonly known as:  RISPERDAL Take 1 tablet (1 mg total) at bedtime by mouth.   tamsulosin 0.4 MG Caps capsule Commonly known as:  FLOMAX Take 1 capsule (0.4 mg total) daily by mouth.   Vitamin D (Ergocalciferol) 50000 units Caps capsule Commonly known as:  DRISDOL Take 1 capsule (50,000 Units total) by mouth every 7 (seven) days. What changed:  additional instructions   zolpidem 5 MG tablet Commonly known as:  AMBIEN Take 1 tablet (5 mg total) by mouth at bedtime as needed for sleep.            Home Infusion Instuctions  (From admission, onward)        Start     Ordered   05/25/17 0000  Home infusion instructions [facility] May follow [place] Dosing Protocol; May administer Cathflo as needed to maintain patency of vascular access device.; Flushing of vascular access device: per [facility] Protocol: 0.9% NaCl pre/post medica...    Question Answer Comment  Instructions May follow [place] Dosing Protocol   Instructions May administer Cathflo as needed to maintain patency of vascular access  device.   Instructions Flushing of vascular access device: per [facility] Protocol: 0.9% NaCl pre/post medication administration and prn patency; Heparin 100 u/ml, 38m for implanted ports and Heparin 10u/ml, 533mfor all other central venous catheters.   Instructions May follow [facility] Anaphylaxis Protocol for First Dose Administration in the home: 0.9% NaCl at 25-50 ml/hr to maintain IV access for protocol meds. Epinephrine 0.3 ml IV/IM PRN and Benadryl 25-50 IV/IM PRN s/s of anaphylaxis.   Instructions [facility] Infusion Coordinator (RN) to assist per patient IV care needs in the home PRN.      05/25/17 1403      Disposition and follow-up:   Ms[facility]  At the hospital follow up visit please address:  1.  Please assess sacral ulcer for signs of ongoing infection. Please ensure compliance with meropenem. Patient will need close monitoring of blood glucose and will likely need further adjustments in insulin regimen. Please ensure patient has a follow up with surgery as she will need removal of Penrose drain in ~ 1 week. Please ensure patient has follow up with neurology as she was supposed to start Rituximab infusions for multiple sclerosis and it does not appear this has been started.   Wound Care: Continue BID wetto dry dressing changes with Santyl.   2.  Labs / imaging needed at time of follow-up: CBC to check WBC and Hgb, BMP to check Cr   3.  Pending labs/ test needing follow-up: None   Follow-up Appointments:   Hospital Course by problem list: Principal Problem:   Pressure injury of skin Active Problems:   Type 1 diabetes mellitus (HCC)   Multiple sclerosis (HCC)   Neurogenic bladder   Hyperglycemia   Bacteriuria   Bacteremia due to vancomycin resistant Enterococcus   Proteus infection   Foley catheter in place on admission   Osteomyelitis of coccyx ([place])   Decubitus ulcer of coccygeal region,  stage IV (HCC)   Normocytic anemia   1. Hyperglycemia: Patient initially presented from [place] with hyperglycemia that was less responsive to dose changes in insulin.  Her BG was 340 on arrival. This was thought to be secondary to an infectious etiology and she underwent and infectious workup that revealed VRE and Proteus bacteremia as discussed below. During this admission her CBGs were very labile (mostly hypoglycemia) which was thought to be secondary to inconsistent PO intake, active systemic infection, and weight loss during recent admission for urosepsis. She was discharged on Lantus 6 units and Novolog 2 units with meals (ONLY if she eats >50% of her meal), and sliding scale insulin. She will require close monitoring of her blood glucose and likely further adjustment of her insulin regimen.   2. VRE and Proteus bacteremia: Patient was afebrile and hemodynamically stable on arrival to the ED. Infectious workup revealed bacteremia with VRE and Proteus which she had previously grown on Ucx during recent admission for VRE and proteus urosepsis. She completed a 5-day course of antibiotic therapy with daptomycin and Rocephin.  Repeat blood cultures were negative.  TTE was negative for vegetations. After completion of antibiotic therapy, patient became febrile and developed a profound leukocytosis. CT abdomen/eplvis ordered (report below) to evaluate for intra-abdominal abscess in the setting of recent urosepsis, but no acute abnormalities seen. MRI pelvis ordered (report below) due to concern for underlying infectious process such as osteomyelitis in the setting of persistent fever and leukocytosis which showed osteomyelitis of coccyx as well as infectious myositis, and small abscesses in the gluteal muscles. Please see below.   3. Osteomyelitis of coccyx: Patient was noted to have persistent fever and leukocytosis after completing antibiotic therapy for bacteremia as described above. MRI pelvis showed  osteomyelitis of coccyx as well as infectious myositis, and small abscesses in the gluteal muscles.  She was started on meropenem per ID recommendations given recent Ucx from 3/17 showing ESBL Klebsiella pneumonia. Surgery was also consulted and she underwent I&D on 3/25 and tolerated procedure well. A Penrose drain was left in place for further drainage. This will be left in place until next week. Wound culture grew VRE and ID recommended a 5-week course of meropenem (3/22-4/26). PICC line placed on 3/26. She is  to receive the last dose of meropenem on 4/26. Referral to wound care clinic made on day of discharge. She will require very close monitoring of this wound to prevent future re-infection. She was discharged on IV Dilaudid 0.5-1 mg q4h PRN and oxycodone 10 mg q6h PRN. Please de-escalate as indicated. Please ensure she has follow up with surgery next week for drain removal. Wound Care: Continue BID wetto dry dressing changes with Santyl.   4. T1DM: Please see management of hyperglycemia as above.   5. Multiple sclerosis: Patient has a history of multiple sclerosis recently diagnosed on 10/2016. At baseline, she has bilateral LE weakness leading to falls and urinary as well as fecal incontinence. She has a chronic indwelling foley catheter due to this which has led to multiple hospitalization for UTI treatment. Her foley was exchanged on 3/18. She had started rituximab infusions for maintenance in Nov 2018. She was supposed to follow up with neurology (Dr. Felecia Shelling) for Rituximab infusion on 01/2017, but it does not appear she followed up. It is unclear why. Please ensure patient has follow up with Dr. Felecia Shelling [facility]).    6. Normocytic anemia: Patient has a history of normocytic anemia from chronic disease with a baseline Hgb of 7-8. Her Hgb trended down to 6.9 on POD#2. She received 1u pRBCS with appropriate response in Hgb. Her Hgb remained stable after this and it was 8.3 on  day of discharge.   7.  CKD Stage III: Patient was found to have an AKI on arrival to the ED. Her Cr was 1.3. This was thought to be in the setting of dehydration due to hyperglycemia. Renal function improved after IVF hydration and treatment for bacteremia. Her Cr was 1.0 on day of discharge.  Discharge Vitals:   BP (!) 153/68 (BP Location: Left Arm)   Pulse 79   Temp 98.1 F (36.7 C) (Oral)   Resp 16   Ht '5\' 5"'$  (1.651 m)   Wt 126 lb 15.8 oz (57.6 kg)   SpO2 99%   BMI 21.13 kg/m   Pertinent Labs, Studies, and Procedures:   CBC Latest Ref Rng & Units 05/25/2017 05/24/2017 05/23/2017  WBC 4.0 - 10.5 K/uL 8.8 9.1 10.7(H)  Hemoglobin 12.0 - 15.0 g/dL 8.3(L) 6.9(LL) 7.1(L)  Hematocrit 36.0 - 46.0 % 26.3(L) 21.3(L) 21.6(L)  Platelets 150 - 400 K/uL 477(H) 486(H) 486(H)   BMP Latest Ref Rng & Units 05/25/2017 05/24/2017 05/23/2017  Glucose 65 - 99 mg/dL 97 62(L) 80  BUN 6 - 20 mg/dL '7 8 10  '$ Creatinine 0.44 - 1.00 mg/dL 1.04(H) 1.09(H) 1.11(H)  BUN/Creat Ratio 9 - 23 - - -  Sodium 135 - 145 mmol/L 136 136 135  Potassium 3.5 - 5.1 mmol/L 4.0 3.8 4.0  Chloride 101 - 111 mmol/L 106 107 105  CO2 22 - 32 mmol/L 22 21(L) 20(L)  Calcium 8.9 - 10.3 mg/dL 8.4(L) 8.3(L) 8.6(L)   CT abdomen/pelvis 3/14: IMPRESSION: Small left pleural effusion with adjacent subsegmental atelectasis. No acute abnormality seen in the abdomen or pelvis. Aortic Atherosclerosis (ICD10-I70.0).  MRI pelvis 3/23:  IMPRESSION: 1. Right paramidline sacral decubitus ulcer extending to the coccyx with underlying osteomyelitis of the C1 and C2 segments. 2. Infectious myositis and small abscesses involving the posterior right gluteus maximus muscle, right gluteus minimus muscle, and right coccygeus muscle as described above. 3. Ill-defined, somewhat loculated rim enhancing phlegmon and early abscess formation within the right ischioanal fossa.  TTE 3/14: Study Conclusions - Left ventricle:  The cavity size was normal.  Wall thickness was   increased in a pattern of mild LVH. Systolic function was normal.   The estimated ejection fraction was in the range of 60% to 65%.   Wall motion was normal; there were no regional wall motion   abnormalities. Doppler parameters are consistent with abnormal   left ventricular relaxation (grade 1 diastolic dysfunction). - Aortic valve: Trileaflet; mildly calcified leaflets. There was   very mild stenosis. Mean gradient (S): 10 mm Hg. Valve area   (Vmean): 1.52 cm^2. - Mitral valve: There was no significant regurgitation. - Right ventricle: The cavity size was normal. Systolic function   was normal. - Tricuspid valve: Peak RV-RA gradient (S): 32 mm Hg. - Pulmonary arteries: PA peak pressure: 40 mm Hg (S). - Systemic veins: IVC measured 2.3 cm with > 50% respirophasic   variation, suggesting RA pressure 8 mmHg. - Pericardium, extracardiac: A small pericardial effusion was   identified.   Discharge Instructions: Discharge Instructions    AMB referral to wound care center   Complete by:  As directed    Sacral wound ulcer and osteomyelitis of coccyx   Diet - low sodium heart healthy   Complete by:  As directed    Discharge instructions   Complete by:  As directed    Mr. Novie, Maggio were admitted to the hospital due to hyperglycemia and were found to have an infection in your blood. This was treated with IV antibiotics. You were also found to have an infection in the ulcer in your lower back. This ulcer grew enterococcus bacteria and you will need an antibiotic called meropenem for the next 5 weeks. Your last dose will be on 4/26. The surgeons left a drain in your wound. You will need follow up with the surgeons next week to get this remove. Please make sure to follow up with them.   For your diabetes: You will now be on Lantus 6 units. You will take Novolog 2 units with meals only if you eat 50$ of your meals. If you do not eat more than 50% of your meals, you should  not get the novolog. You will continue on the sliding scale insulin as well. These changes are to prevent episodes of low blood sugar.   For your multiple sclerosis, please contact Dr. Felecia Shelling with [facility] to arrange your next infusion of Rituximab.   Please call us if you have any questions.   Home infusion instructions [facility] May follow [place] Dosing Protocol; May administer Cathflo as needed to maintain patency of vascular access device.; Flushing of vascular access device: per [facility] Protocol: 0.9% NaCl pre/post medica...   Complete by:  As directed    Instructions:  May follow [place] Dosing Protocol   Instructions:  May administer Cathflo as needed to maintain patency of vascular access device.   Instructions:  Flushing of vascular access device: per [facility] Protocol: 0.9% NaCl pre/post medication administration and prn patency; Heparin 100 u/ml, 23m for implanted ports and Heparin 10u/ml, 532mfor all other central venous catheters.   Instructions:  May follow [facility] Anaphylaxis Protocol for First Dose Administration in the home: 0.9% NaCl at 25-50 ml/hr to maintain IV access for protocol meds. Epinephrine 0.3 ml IV/IM PRN and Benadryl 25-50 IV/IM PRN s/s of anaphylaxis.   Instructions:  [facility] Coord[facility] (RN) to assist per patient IV care needs in the home PRN.   Increase activity  slowly   Complete by:  As directed       Signed: Welford Roche, MD 05/25/2017, 3:55 PM   Pager: [phone]

## 2017-05-25 NOTE — Progress Notes (Signed)
Physical Therapy Treatment Patient Details Name: Debra [name] MRN: [id number] DOB: 1965/04/15 Today's Date: 05/25/2017    History of Present Illness Ms. Bacigalupo is a 52 y/o female with PMH of diabetes, MS resulting in neurogenic bladder (with chronic indwelling foley), HTN, CKD III, recurrent UTIs and depression who presents from her nursing facility with elevated blood sugar. She was recently admitted from 2/14-2/26 for sepsis secondary to Proteus bacteremia, VRE UTI, requiring intubation 2/15-2/19 and pressors, as well as C. Diff infection.    PT Comments    Pt remains very limited secondary to pain and weakness. Pt only able to tolerate rolling bilaterally in bed for pericare following a BM in bed. Pt would continue to benefit from skilled physical therapy services at this time while admitted and after d/c to address the below listed limitations in order to improve overall safety and independence with functional mobility.    Follow Up Recommendations  SNF     Equipment Recommendations  None recommended by PT    Recommendations for Other Services       Precautions / Restrictions Precautions Precautions: Fall Precaution Comments: decubitus ulcer on sacrum Restrictions Weight Bearing Restrictions: No    Mobility  Bed Mobility Overal bed mobility: Needs Assistance Bed Mobility: Rolling Rolling: Max assist;+2 for physical assistance         General bed mobility comments: increased time, very painful with mobility, pt able to use UEs on bed rails; max A x2 to roll bilaterally for pericare after having BM in bed  Transfers                 General transfer comment: deferred  Ambulation/Gait                 Stairs            Wheelchair Mobility    Modified Rankin (Stroke Patients Only)       Balance                                            Cognition Arousal/Alertness: Awake/alert Behavior During Therapy: Flat  affect Overall Cognitive Status: Impaired/Different from baseline Area of Impairment: Following commands;Safety/judgement;Problem solving                       Following Commands: Follows one step commands with increased time Safety/Judgement: Decreased awareness of deficits;Decreased awareness of safety   Problem Solving: Decreased initiation;Requires verbal cues        Exercises      General Comments General comments (skin integrity, edema, etc.): pt only able to tolerate rolling bilaterally in bed for pericare after having BM in bed      Pertinent Vitals/Pain Pain Assessment: Faces Faces Pain Scale: Hurts worst Pain Location: buttocks Pain Descriptors / Indicators: Grimacing;Guarding;Moaning;Crying Pain Intervention(s): Monitored during session;Repositioned    Home Living                      Prior Function            PT Goals (current goals can now be found in the care plan section) Acute Rehab PT Goals PT Goal Formulation: With patient Time For Goal Achievement: 06/06/17 Potential to Achieve Goals: Fair Progress towards PT goals: Not progressing toward goals - comment(very limited by pain)    Frequency    Min 2X/week  PT Plan Current plan remains appropriate    Co-evaluation              AM-PAC PT "6 Clicks" Daily Activity  Outcome Measure  Difficulty turning over in bed (including adjusting bedclothes, sheets and blankets)?: Unable Difficulty moving from lying on back to sitting on the side of the bed? : Unable Difficulty sitting down on and standing up from a chair with arms (e.g., wheelchair, bedside commode, etc,.)?: Unable Help needed moving to and from a bed to chair (including a wheelchair)?: Total Help needed walking in hospital room?: Total Help needed climbing 3-5 steps with a railing? : Total 6 Click Score: 6    End of Session   Activity Tolerance: Patient limited by pain Patient left: in bed;with call  bell/phone within reach Nurse Communication: Mobility status;Patient requests pain meds PT Visit Diagnosis: Muscle weakness (generalized) (M62.81);Adult, failure to thrive (R62.7)     Time: 8250-5397 PT Time Calculation (min) (ACUTE ONLY): 34 min  Charges:  $Therapeutic Activity: 23-37 mins                    G Codes:       [place], [place], [name] [phone]    [name] Jillian Warth 05/25/2017, 2:55 PM

## 2017-05-26 LAB — AEROBIC/ANAEROBIC CULTURE (SURGICAL/DEEP WOUND)

## 2017-05-29 ENCOUNTER — Ambulatory Visit: Payer: MEDICAID | Admitting: Neurology

## 2017-05-29 ENCOUNTER — Encounter: Payer: Self-pay | Admitting: Neurology

## 2017-05-29 NOTE — Progress Notes (Signed)
To clarify diagnosis Patient presented with Hyperglycemia, she was then found to have VRE and Proteus Bacteremia due to Indwelling foley catheter, she was not "Septic" at time of admission.  She was treated for this. Also noted to have a unstageable sacral ulcer.  During her hospital stay it was eventually discovered that her Sacral Ulcer was stage 4 associated with abscess and osteomyelitis of her sacrum, she never had any end organ dysfunction or signs of instability during this prolonged hospital stay, Sepsis was not present at admission and I do not feel that it developed during the admission.  She did have intermittent fevers and leukocytosis due to the sacral abscess and osteomyelitis.

## 2017-06-13 ENCOUNTER — Other Ambulatory Visit: Payer: Self-pay

## 2017-06-13 ENCOUNTER — Encounter: Payer: Self-pay | Admitting: *Deleted

## 2017-06-13 ENCOUNTER — Ambulatory Visit (INDEPENDENT_AMBULATORY_CARE_PROVIDER_SITE_OTHER): Payer: Medicaid Other | Admitting: Neurology

## 2017-06-13 ENCOUNTER — Encounter: Payer: Self-pay | Admitting: Neurology

## 2017-06-13 VITALS — BP 84/55 | HR 86 | Resp 20 | Ht 65.0 in

## 2017-06-13 DIAGNOSIS — G35 Multiple sclerosis: Secondary | ICD-10-CM

## 2017-06-13 DIAGNOSIS — R29898 Other symptoms and signs involving the musculoskeletal system: Secondary | ICD-10-CM

## 2017-06-13 DIAGNOSIS — N319 Neuromuscular dysfunction of bladder, unspecified: Secondary | ICD-10-CM

## 2017-06-13 DIAGNOSIS — F322 Major depressive disorder, single episode, severe without psychotic features: Secondary | ICD-10-CM | POA: Diagnosis not present

## 2017-06-13 NOTE — Progress Notes (Addendum)
[facility]  PATIENT: Debra [name] DOB: March 03, 1965     _________________________________   HISTORICAL  CHIEF COMPLAINT:  Chief Complaint  Patient presents with  . Multiple Sclerosis    Resident at [facility] (phone# [phone]). Here alone, unable to hold her head up by herself for more than a couple of minutes, lethargic, wakes with stimulation and can answer some questions.  Tearful and when asked what is wrong, she sts. "It just hurts."  Indicates that her neck hurts. Pillow placed behind neck and head repositioned.   Sts. dx. with MS in September 2018. Presenting sx. was gait disturbance. Sts. has been hospitalized at [facility], non-ambulatory since October.  Not started on any dmt  . Gait Disturbance    Sts. lies in bed most of the day. Sacral decubitus ulcer debrieded 05/21/17./fim    HISTORY OF PRESENT ILLNESS:  Debra [name] is a 52 year old woman who was diagnosed with multiple sclerosis in September 2018 after presenting with leg weakness.  She is a poor historian so much of the history is obtained from multiple notes.  She noted that about a year ago she would have some weakness in her legs and they would give out at times causing her to fall.  However, she was still able to walk and felt that she did not have any major problems.  Then, starting in September 2018 she had progressive and more serious weakness in her legs.    She was unable to stand without strong support.  She presented first to [facility] and then was transferred to [facility].  MRIs were consistent with MS.  She received 5 days of IV Solu-Medrol the first few days of November 2018.  Due to the aggressiveness of her MS, we decided to have her try rituximab 1000 mg x2 and she received both doses in December 02/15/2017 and February 21, 2017.  She tolerated the infusion well.   Compared to a normal neurologic exam in mid December, she appears to have more weakness in her  legs  Currently, she is unable to walk and spends most of the time in bed or in a wheelchair.  She is unable to raise her legs.  She has an indwelling Foley catheter due to a neurogenic bladder.  She reports mild weakness in the arms, left more than right.  She is on baclofen for spasticity.  She denies any numbness in the arms or legs.  She notes some blurry vision and feels her right eye is worse than the left eye.   She has difficulty falling asleep and staying asleep but then is sleepy much of the day.   She has a long psychiatric history.  More recently she has had skin breakdown with sacral decubitus invading the coccyx requiring antibiotics and debridement.   I personally reviewed the MRIs of the brain from 03/14/2017 and 12/31/2016 and the MRI of the cervical spine from 01/31/2017.   The MRI of the cervical spine shows several small T2 hyperintense foci within the spinal cord slightly to the right adjacent to C2, C6 and C7.   There is another focus at T10 on thoracic images.   Multiple hospital admission notes, discharge notes and neurology consult notes were reviewed.  REVIEW OF SYSTEMS: Constitutional: No fevers, chills, sweats, or change in appetite/   lethargy and poor sleep Eyes: No visual changes, double vision, eye pain Ear, nose and throat: No hearing loss, ear pain, nasal congestion, sore throat Cardiovascular: No chest pain,  palpitations Respiratory: No shortness of breath at rest or with exertion.   No wheezes GastrointestinaI: No nausea, vomiting, diarrhea, abdominal pain, fecal incontinence Genitourinary:Neurogenic bladder with indwelling catheter  musculoskeletal:Reports back pain Integumentary: No rash, pruritus, skin lesions Neurological: as above Psychiatric: Depression Endocrine: No palpitations, diaphoresis, change in appetite, change in weigh or increased thirst Hematologic/Lymphatic: No anemia, purpura, petechiae. Allergic/Immunologic: No itchy/runny eyes, nasal  congestion, recent allergic reactions, rashes  ALLERGIES: Allergies  Allergen Reactions  . Penicillins Anaphylaxis, Nausea And Vomiting and Rash    Has patient had a PCN reaction causing immediate rash, facial/tongue/throat swelling, SOB or lightheadedness with hypotension: Yes Has patient had a PCN reaction causing severe rash involving mucus membranes or skin necrosis: Yes Has patient had a PCN reaction that required hospitalization No Has patient had a PCN reaction occurring within the last 10 years: Yes If all of the above answers are "NO", then may proceed with Cephalosporin use.   . Pollen Extract Other (See Comments)    Seasonal Allergies  . Tape Rash    HOME MEDICATIONS:  Current Outpatient Medications:  .  acetaminophen (TYLENOL) 325 MG tablet, Take 650 mg by mouth every 4 (four) hours as needed for mild pain or fever. , Disp: , Rfl:  .  amLODipine (NORVASC) 10 MG tablet, Take 1 tablet (10 mg total) by mouth daily., Disp: 90 tablet, Rfl: 1 .  aspirin EC 81 MG tablet, Take 1 tablet (81 mg total) by mouth daily., Disp: 90 tablet, Rfl: 3 .  atorvastatin (LIPITOR) 40 MG tablet, Take 1 tablet (40 mg total) by mouth daily., Disp: 90 tablet, Rfl: 3 .  Baclofen 5 MG TABS, Take 5 mg by mouth 3 (three) times daily., Disp: 5 tablet, Rfl: 0 .  bethanechol (URECHOLINE) 50 MG tablet, Take 1 tablet (50 mg total) by mouth 3 (three) times daily., Disp: , Rfl:  .  collagenase (SANTYL) ointment, Apply topically 2 (two) times daily., Disp: 15 g, Rfl: 0 .  GLUCAGON HCL, RDNA, IJ, Inject 1 mg into the muscle as needed (hypoglycemia-administer for CBG <60)., Disp: , Rfl:  .  insulin aspart (NOVOLOG) 100 UNIT/ML injection, Inject 2-10 Units into the skin 3 (three) times daily before meals. 150-200=2 units 201-250=4 units 241-300=6 units 301-350=8 units 351-400=10 units, Disp: , Rfl:  .  insulin glargine (LANTUS) 100 UNIT/ML injection, Inject 0.06 mLs (6 Units total) into the skin daily., Disp: 10 mL,  Rfl: 0 .  lidocaine (LIDODERM) 5 %, Place 1 patch onto the skin daily. FOR LOWER BACK PAIN Remove & Discard patch within 12 hours or as directed by MD, Disp: , Rfl:  .  lisinopril (PRINIVIL,ZESTRIL) 20 MG tablet, Take 1 tablet (20 mg total) by mouth daily., Disp: , Rfl:  .  loperamide (IMODIUM) 2 MG capsule, Take 2 capsules (4 mg total) by mouth as needed for diarrhea or loose stools., Disp: , Rfl:  .  magnesium hydroxide (MILK OF MAGNESIA) 400 MG/5ML suspension, Take 30 mLs by mouth daily as needed for mild constipation., Disp: 360 mL, Rfl: 0 .  meclizine (ANTIVERT) 12.5 MG tablet, Take 12.5 mg by mouth 3 (three) times daily as needed for dizziness. per nsg. home MAR, pt. takes 1 tablet 1 hour prior to therapy., Disp: , Rfl:  .  meropenem (MERREM) IVPB, Inject 1 g into the vein every 12 (twelve) hours. Indication:  Osteomyelitis Last Day of Therapy:  06/28/17 Labs - Once weekly:  CBC/D and BMP, Labs - Every other week:  ESR and  CRP, Disp: 70 Units, Rfl: 0 .  metoprolol succinate (TOPROL-XL) 100 MG 24 hr tablet, Take 1 tablet (100 mg total) by mouth daily. Take with or immediately following a meal., Disp: 90 tablet, Rfl: 0 .  mirtazapine (REMERON) 45 MG tablet, Take 1 tablet (45 mg total) by mouth at bedtime., Disp: , Rfl:  .  ondansetron (ZOFRAN) 4 MG tablet, Take 4 mg by mouth daily., Disp: , Rfl:  .  Oxycodone HCl 10 MG TABS, Take 1 tablet (10 mg total) by mouth every 6 (six) hours as needed (PRN for post-op pain.)., Disp: 28 tablet, Rfl: 0 .  pantoprazole (PROTONIX) 40 MG tablet, Take 1 tablet (40 mg total) by mouth daily., Disp: , Rfl:  .  polyethylene glycol (MIRALAX / GLYCOLAX) packet, Take 17 g by mouth daily as needed for mild constipation, moderate constipation or severe constipation., Disp: 14 each, Rfl: 0 .  pregabalin (LYRICA) 25 MG capsule, Take 25 mg by mouth 3 (three) times daily., Disp: , Rfl:  .  risperiDONE (RISPERDAL) 1 MG tablet, Take 1 tablet (1 mg total) at bedtime by mouth., Disp:  , Rfl:  .  tamsulosin (FLOMAX) 0.4 MG CAPS capsule, Take 1 capsule (0.4 mg total) daily by mouth., Disp: 30 capsule, Rfl:  .  Vitamin D, Ergocalciferol, (DRISDOL) 50000 units CAPS capsule, Take 1 capsule (50,000 Units total) by mouth every 7 (seven) days. (Patient taking differently: Take 50,000 Units by mouth every 7 (seven) days. On Thursdays), Disp: 30 capsule, Rfl:  .  feeding supplement, GLUCERNA SHAKE, (GLUCERNA SHAKE) LIQD, Take 237 mLs by mouth 2 (two) times daily between meals. (Patient not taking: Reported on 05/05/2017), Disp: , Rfl: 0 .  protein supplement shake (PREMIER PROTEIN) LIQD, Take 325 mLs (11 oz total) by mouth 2 (two) times daily between meals. (Patient not taking: Reported on 06/13/2017), Disp: , Rfl: 0 .  zolpidem (AMBIEN) 5 MG tablet, Take 1 tablet (5 mg total) by mouth at bedtime as needed for sleep. (Patient not taking: Reported on 06/13/2017), Disp: 5 tablet, Rfl: 0  PAST MEDICAL HISTORY: Past Medical History:  Diagnosis Date  . Adenomatous colonic polyps   . Anemia    2005  . Anxiety    1990  . Arthritis    "knees, hands" (05/07/2017)  . Asthma    2000  . Benign essential HTN   . Cataract   . Chronic lower back pain   . Depression with psychotic symptoms   . Diabetic ketoacidosis without coma associated with type 1 diabetes mellitus ([place])   . Difficult intubation    narrow airway  . Fibromyalgia 11/07/2016  . Gastroesophageal Reflux Disease (GERD)   . Heart murmur    Birth  . Hyperlipidemia    2005  . Hypertension    1998  . Internal hemorrhoids 04/24/06   on colonoscopy  . Migraine    "used to have them all the time; now maybe q6 months" (05/07/2017)  . Multiple sclerosis ([place])   . Neuropathic bladder   . Neuropathy of the hands & feet   . Restless Leg Syndrome   . Sepsis due to urinary tract infection ([place]) 04/12/2017  . Sleep paralysis   . Stable angina pectoris    2007: cath showing normal cors.   . Stroke 1990   denies residual on 05/07/2017  .  Type I Diabetes Mellitus 1988    PAST SURGICAL HISTORY: Past Surgical History:  Procedure Laterality Date  . BREAST SURGERY Left  biopsy left breast  . CARDIAC CATHETERIZATION  ~ 2007/2008  . COLONOSCOPY W/ BIOPSIES AND POLYPECTOMY    . DEBRIDMENT OF DECUBITUS ULCER N/A 05/21/2017   Procedure: DEBRIDMENT OF SACRAL DECUBITUS ULCER;  Surgeon: Fanny Skates, MD;  Location: [facility];  Service: General;  Laterality: N/A;  . DILATION AND CURETTAGE OF UTERUS  12/29/2010   Surgeon: Osborne Oman, MD;  Location: [facility];  Service: Gynecology  . ENDOMETRIAL ABLATION W/ NOVASURE N/A 12/2009  . EYE SURGERY Bilateral    "lots; laser tto correct broken blood vessels from diabetes"  . FOOT SURGERY Bilateral    "bones fused; bones removed"  . FRACTURE SURGERY    . HYSTEROSCOPY  12/29/2010   Procedure: HYSTEROSCOPY WITH HYDROTHERMAL ABLATION;  Surgeon: Osborne Oman, MD;  Location: [facility];  Service: Gynecology;;  . IUD REMOVAL  12/29/2010   Procedure: INTRAUTERINE DEVICE (IUD) REMOVAL;  Surgeon: Osborne Oman, MD;  Location: [facility];  Service: Gynecology;  Laterality: N/A;  . LAPAROSCOPIC CHOLECYSTECTOMY    . LAPAROSCOPIC TUBAL LIGATION  12/29/2010   Procedure: LAPAROSCOPIC TUBAL LIGATION;  Surgeon: Osborne Oman, MD;  Location: [facility];  Service: Gynecology;  Laterality: Bilateral;  . ORIF ANKLE FRACTURE Right 10/09/2013   Procedure: Open reduction internal fixation right ankle;  Surgeon: Nita Sells, MD;  Location: [facility];  Service: Orthopedics;  Laterality: Right;  Open reduction internal fixation right ankle  . TRIGGER FINGER RELEASE     x 3  . TUBAL LIGATION      FAMILY HISTORY: Family History  Problem Relation Age of Onset  . Hypertension Mother   . Kidney disease Mother   . Hypertension Father   . Breast cancer Maternal Grandmother   . Prostate cancer Maternal Grandfather   . Ovarian cancer Paternal Grandmother   . Prostate cancer Paternal Grandfather   . Colon cancer  Maternal Uncle        Family history of malignant neoplasm of gastrointestinal tract    SOCIAL HISTORY:  Social History   Socioeconomic History  . Marital status: Divorced    Spouse name: Not on file  . Number of children: 2  . Years of education: 59  . Highest education level: Not on file  Occupational History  . Occupation: Unemployed  Social Needs  . Financial resource strain: Not on file  . Food insecurity:    Worry: Not on file    Inability: Not on file  . Transportation needs:    Medical: Not on file    Non-medical: Not on file  Tobacco Use  . Smoking status: Never Smoker  . Smokeless tobacco: Never Used  Substance and Sexual Activity  . Alcohol use: No    Alcohol/week: 0.0 oz  . Drug use: No    Comment: drug addict  . Sexual activity: Not Currently    Birth control/protection: Other-see comments  Lifestyle  . Physical activity:    Days per week: Not on file    Minutes per session: Not on file  . Stress: Not on file  Relationships  . Social connections:    Talks on phone: Not on file    Gets together: Not on file    Attends religious service: Not on file    Active member of club or organization: Not on file    Attends meetings of clubs or organizations: Not on file    Relationship status: Not on file  . Intimate partner violence:    Fear of current or  ex partner: Not on file    Emotionally abused: Not on file    Physically abused: Not on file    Forced sexual activity: Not on file  Other Topics Concern  . Not on file  Social History Narrative   Lost medicaid about 2009 ish when her youngest child turned 63. Has adult children. Lives with boyfriend who financially supports her. Has attempted to get disability but has been turned down.      2-3 caffeine drinks daily      PHYSICAL EXAM  Vitals:   06/13/17 1327  BP: (!) 84/55  Pulse: 86  Resp: 20  Height: '5\' 5"'$  (1.651 m)    Body mass index is 21.13 kg/m.   General: The patient is  well-developed and well-nourished and in no acute distress  Eyes:  Funduscopic exam shows mild optic nerve pallor on the right.  Color vision is reduced on the right.  Neck: The neck is supple, no carotid bruits are noted.  The neck is nontender.  Cardiovascular: The heart has a regular rate and rhythm with a normal S1 and S2. There were no murmurs, gallops or rubs. Lungs are clear to auscultation.  Skin: Extremities are without significant edema.  Musculoskeletal:  Back is nontender  Neurologic Exam  Mental status: She appeared apathetic.  She was oriented to name, "doctor's office" and April.  She knew some details of her hospitalizations.  Focus, attention and short-term memory appear to be reduced.  Cranial nerves: Extraocular movements are full.  She had end gaze nystagmus to the right.  She has a 1+ right APD facial symmetry is present. There is good facial sensation to soft touch bilaterally.Facial strength is normal.  Trapezius and sternocleidomastoid strength is normal. No dysarthria is noted.  The tongue is midline, and the patient has symmetric elevation of the soft palate. No obvious hearing deficits are noted.  Motor:  Muscle bulk is normal.   Tone is increased in the legs.. Strength is 4/5 in left arm and 4+/5 in right arm, 1 to 2-/5 in the legs.  Sensory: Sensory testing is intact to pinprick, soft touch and vibration sensation in all 4 extremities.  Coordination: Finger-nose-finger is reduced and she cannot do heel to shin.  Gait and station: Unable to stand  Reflexes: Deep tendon reflexes are increased at the knees with spread.   Plantar responses are extensor.    DIAGNOSTIC DATA (LABS, IMAGING, TESTING) - I reviewed patient records, labs, notes, testing and imaging myself where available.  Lab Results  Component Value Date   WBC 8.8 05/25/2017   HGB 8.3 (L) 05/25/2017   HCT 26.3 (L) 05/25/2017   MCV 84.0 05/25/2017   PLT 477 (H) 05/25/2017      Component  Value Date/Time   NA 136 05/25/2017 0439   NA 135 04/21/2015 0956   K 4.0 05/25/2017 0439   CL 106 05/25/2017 0439   CO2 22 05/25/2017 0439   GLUCOSE 97 05/25/2017 0439   BUN 7 05/25/2017 0439   BUN 11 04/21/2015 0956   CREATININE 1.04 (H) 05/25/2017 0439   CREATININE 0.79 08/28/2014 1223   CALCIUM 8.4 (L) 05/25/2017 0439   PROT 5.7 (L) 05/05/2017 0220   PROT 6.4 04/21/2015 0956   ALBUMIN 2.2 (L) 05/05/2017 0220   ALBUMIN 3.6 04/21/2015 0956   AST 14 (L) 05/05/2017 0220   ALT 12 (L) 05/05/2017 0220   ALKPHOS 92 05/05/2017 0220   BILITOT 0.4 05/05/2017 0220   BILITOT <0.2 04/21/2015  3754   GFRNONAA >60 05/25/2017 0439   GFRNONAA 89 08/28/2014 1223   GFRAA >60 05/25/2017 0439   GFRAA >89 08/28/2014 1223   Lab Results  Component Value Date   CHOL 85 04/19/2017   HDL 26 (L) 04/19/2017   LDLCALC 27 04/19/2017   TRIG 162 (H) 04/19/2017   CHOLHDL 3.3 04/19/2017   Lab Results  Component Value Date   HGBA1C 10.3 (H) 04/19/2017   Lab Results  Component Value Date   VITAMINB12 1,449 (H) 04/20/2017   Lab Results  Component Value Date   TSH 0.866 03/15/2017       ASSESSMENT AND PLAN  Multiple sclerosis (HCC)  Leg weakness, bilateral  Neurogenic bladder  Major depressive disorder, single episode, severe without psychosis ([place])   In summary, Mrs. Mariea Clonts is a 52 year old woman who was diagnosed with MS late last year and received Rituxan 1000 mg x2 in December 2018.     Because she has an active infection, I would not recommend continuing Rituxan.  Instead, we will transition her to Aubagio which is less likely to have an immunosuppressive effect, though it is not as strong.  If lethargy does not improve, consider Nuvigil or Provigil.  She will return to see me in 3-4 months or sooner if there are new or worsening neurologic symptoms.   Astin Sayre A. Felecia Shelling, MD, PhD, FAAN Certified in Neurology, Clinical Neurophysiology, Sleep Medicine, Pain Medicine and  Neuroimaging Director, [place] at [facility] [address] [place], [place] [address] [phone]

## 2017-06-15 ENCOUNTER — Telehealth: Payer: Self-pay | Admitting: *Deleted

## 2017-06-15 NOTE — Telephone Encounter (Addendum)
PA started for [facility] via phone w/ [place] [employer] [phone]). Decision pending - PJ#[id number].  Preferred meds: 1) Betaseron 2) Avonex 3) Copaxone 4) Rebif 5) Gilenya 6) Tecfidera  She has only tried Rituxan in the past.  Preferred meds are contraindicated for the following reasons: inability to self-inject, skin breakdown, open wounds, active infection and high risk for further infection.  Aubagio is less likely to have an immunosuppressive effect and is the most appropriate, safest choice for the patient.

## 2017-06-26 ENCOUNTER — Inpatient Hospital Stay (HOSPITAL_COMMUNITY): Payer: Medicaid Other

## 2017-06-26 ENCOUNTER — Inpatient Hospital Stay (HOSPITAL_COMMUNITY)
Admission: EM | Admit: 2017-06-26 | Discharge: 2017-07-05 | DRG: 314 | Disposition: A | Discharge status: Skilled Nursing Facility | Payer: Medicaid Other | Attending: Internal Medicine | Admitting: Internal Medicine

## 2017-06-26 ENCOUNTER — Emergency Department (HOSPITAL_COMMUNITY): Payer: Medicaid Other

## 2017-06-26 ENCOUNTER — Encounter (HOSPITAL_COMMUNITY): Payer: Self-pay | Admitting: Emergency Medicine

## 2017-06-26 DIAGNOSIS — Z515 Encounter for palliative care: Secondary | ICD-10-CM

## 2017-06-26 DIAGNOSIS — F331 Major depressive disorder, recurrent, moderate: Secondary | ICD-10-CM | POA: Diagnosis present

## 2017-06-26 DIAGNOSIS — R1310 Dysphagia, unspecified: Secondary | ICD-10-CM | POA: Diagnosis present

## 2017-06-26 DIAGNOSIS — M4628 Osteomyelitis of vertebra, sacral and sacrococcygeal region: Secondary | ICD-10-CM | POA: Diagnosis not present

## 2017-06-26 DIAGNOSIS — N10 Acute tubulo-interstitial nephritis: Secondary | ICD-10-CM

## 2017-06-26 DIAGNOSIS — M545 Low back pain: Secondary | ICD-10-CM | POA: Diagnosis not present

## 2017-06-26 DIAGNOSIS — N319 Neuromuscular dysfunction of bladder, unspecified: Secondary | ICD-10-CM | POA: Diagnosis present

## 2017-06-26 DIAGNOSIS — J9601 Acute respiratory failure with hypoxia: Secondary | ICD-10-CM | POA: Diagnosis present

## 2017-06-26 DIAGNOSIS — E1311 Other specified diabetes mellitus with ketoacidosis with coma: Secondary | ICD-10-CM | POA: Diagnosis not present

## 2017-06-26 DIAGNOSIS — B9562 Methicillin resistant Staphylococcus aureus infection as the cause of diseases classified elsewhere: Secondary | ICD-10-CM | POA: Diagnosis not present

## 2017-06-26 DIAGNOSIS — Z79899 Other long term (current) drug therapy: Secondary | ICD-10-CM

## 2017-06-26 DIAGNOSIS — L89159 Pressure ulcer of sacral region, unspecified stage: Secondary | ICD-10-CM | POA: Diagnosis not present

## 2017-06-26 DIAGNOSIS — Z7401 Bed confinement status: Secondary | ICD-10-CM | POA: Diagnosis not present

## 2017-06-26 DIAGNOSIS — N183 Chronic kidney disease, stage 3 (moderate): Secondary | ICD-10-CM | POA: Diagnosis present

## 2017-06-26 DIAGNOSIS — K6139 Other ischiorectal abscess: Secondary | ICD-10-CM | POA: Diagnosis not present

## 2017-06-26 DIAGNOSIS — F333 Major depressive disorder, recurrent, severe with psychotic symptoms: Secondary | ICD-10-CM | POA: Diagnosis not present

## 2017-06-26 DIAGNOSIS — Z560 Unemployment, unspecified: Secondary | ICD-10-CM | POA: Diagnosis not present

## 2017-06-26 DIAGNOSIS — G35 Multiple sclerosis: Secondary | ICD-10-CM | POA: Diagnosis not present

## 2017-06-26 DIAGNOSIS — R6520 Severe sepsis without septic shock: Secondary | ICD-10-CM | POA: Diagnosis not present

## 2017-06-26 DIAGNOSIS — R627 Adult failure to thrive: Secondary | ICD-10-CM | POA: Diagnosis present

## 2017-06-26 DIAGNOSIS — Z7982 Long term (current) use of aspirin: Secondary | ICD-10-CM

## 2017-06-26 DIAGNOSIS — E1110 Type 2 diabetes mellitus with ketoacidosis without coma: Secondary | ICD-10-CM | POA: Diagnosis present

## 2017-06-26 DIAGNOSIS — R7881 Bacteremia: Secondary | ICD-10-CM | POA: Diagnosis not present

## 2017-06-26 DIAGNOSIS — D649 Anemia, unspecified: Secondary | ICD-10-CM | POA: Diagnosis present

## 2017-06-26 DIAGNOSIS — E785 Hyperlipidemia, unspecified: Secondary | ICD-10-CM | POA: Diagnosis present

## 2017-06-26 DIAGNOSIS — R6521 Severe sepsis with septic shock: Secondary | ICD-10-CM | POA: Diagnosis present

## 2017-06-26 DIAGNOSIS — IMO0002 Reserved for concepts with insufficient information to code with codable children: Secondary | ICD-10-CM | POA: Diagnosis present

## 2017-06-26 DIAGNOSIS — F419 Anxiety disorder, unspecified: Secondary | ICD-10-CM | POA: Diagnosis not present

## 2017-06-26 DIAGNOSIS — B958 Unspecified staphylococcus as the cause of diseases classified elsewhere: Secondary | ICD-10-CM | POA: Diagnosis not present

## 2017-06-26 DIAGNOSIS — E739 Lactose intolerance, unspecified: Secondary | ICD-10-CM | POA: Diagnosis not present

## 2017-06-26 DIAGNOSIS — F4323 Adjustment disorder with mixed anxiety and depressed mood: Secondary | ICD-10-CM | POA: Diagnosis not present

## 2017-06-26 DIAGNOSIS — Z880 Allergy status to penicillin: Secondary | ICD-10-CM | POA: Diagnosis not present

## 2017-06-26 DIAGNOSIS — E1010 Type 1 diabetes mellitus with ketoacidosis without coma: Secondary | ICD-10-CM | POA: Diagnosis present

## 2017-06-26 DIAGNOSIS — T80211A Bloodstream infection due to central venous catheter, initial encounter: Principal | ICD-10-CM | POA: Diagnosis present

## 2017-06-26 DIAGNOSIS — E109 Type 1 diabetes mellitus without complications: Secondary | ICD-10-CM | POA: Diagnosis not present

## 2017-06-26 DIAGNOSIS — N39498 Other specified urinary incontinence: Secondary | ICD-10-CM | POA: Diagnosis not present

## 2017-06-26 DIAGNOSIS — N179 Acute kidney failure, unspecified: Secondary | ICD-10-CM | POA: Diagnosis not present

## 2017-06-26 DIAGNOSIS — Z889 Allergy status to unspecified drugs, medicaments and biological substances status: Secondary | ICD-10-CM

## 2017-06-26 DIAGNOSIS — R680 Hypothermia, not associated with low environmental temperature: Secondary | ICD-10-CM | POA: Diagnosis present

## 2017-06-26 DIAGNOSIS — Z87440 Personal history of urinary (tract) infections: Secondary | ICD-10-CM | POA: Diagnosis not present

## 2017-06-26 DIAGNOSIS — Z91048 Other nonmedicinal substance allergy status: Secondary | ICD-10-CM | POA: Diagnosis not present

## 2017-06-26 DIAGNOSIS — R4182 Altered mental status, unspecified: Secondary | ICD-10-CM

## 2017-06-26 DIAGNOSIS — E10622 Type 1 diabetes mellitus with other skin ulcer: Secondary | ICD-10-CM | POA: Diagnosis not present

## 2017-06-26 DIAGNOSIS — I10 Essential (primary) hypertension: Secondary | ICD-10-CM | POA: Diagnosis not present

## 2017-06-26 DIAGNOSIS — T80219D Unspecified infection due to central venous catheter, subsequent encounter: Secondary | ICD-10-CM

## 2017-06-26 DIAGNOSIS — R450 Nervousness: Secondary | ICD-10-CM | POA: Diagnosis not present

## 2017-06-26 DIAGNOSIS — Z95828 Presence of other vascular implants and grafts: Secondary | ICD-10-CM | POA: Diagnosis not present

## 2017-06-26 DIAGNOSIS — I129 Hypertensive chronic kidney disease with stage 1 through stage 4 chronic kidney disease, or unspecified chronic kidney disease: Secondary | ICD-10-CM | POA: Diagnosis not present

## 2017-06-26 DIAGNOSIS — E10649 Type 1 diabetes mellitus with hypoglycemia without coma: Secondary | ICD-10-CM | POA: Diagnosis present

## 2017-06-26 DIAGNOSIS — L89154 Pressure ulcer of sacral region, stage 4: Secondary | ICD-10-CM | POA: Diagnosis present

## 2017-06-26 DIAGNOSIS — R159 Full incontinence of feces: Secondary | ICD-10-CM | POA: Diagnosis present

## 2017-06-26 DIAGNOSIS — E1065 Type 1 diabetes mellitus with hyperglycemia: Secondary | ICD-10-CM

## 2017-06-26 DIAGNOSIS — A411 Sepsis due to other specified staphylococcus: Secondary | ICD-10-CM | POA: Diagnosis not present

## 2017-06-26 DIAGNOSIS — Y848 Other medical procedures as the cause of abnormal reaction of the patient, or of later complication, without mention of misadventure at the time of the procedure: Secondary | ICD-10-CM | POA: Diagnosis present

## 2017-06-26 DIAGNOSIS — R011 Cardiac murmur, unspecified: Secondary | ICD-10-CM | POA: Diagnosis not present

## 2017-06-26 DIAGNOSIS — J301 Allergic rhinitis due to pollen: Secondary | ICD-10-CM | POA: Diagnosis not present

## 2017-06-26 DIAGNOSIS — R1312 Dysphagia, oropharyngeal phase: Secondary | ICD-10-CM | POA: Diagnosis present

## 2017-06-26 DIAGNOSIS — G9341 Metabolic encephalopathy: Secondary | ICD-10-CM | POA: Diagnosis present

## 2017-06-26 DIAGNOSIS — K599 Functional intestinal disorder, unspecified: Secondary | ICD-10-CM | POA: Diagnosis not present

## 2017-06-26 DIAGNOSIS — A419 Sepsis, unspecified organism: Secondary | ICD-10-CM | POA: Diagnosis not present

## 2017-06-26 DIAGNOSIS — E1069 Type 1 diabetes mellitus with other specified complication: Secondary | ICD-10-CM | POA: Diagnosis present

## 2017-06-26 DIAGNOSIS — R630 Anorexia: Secondary | ICD-10-CM | POA: Diagnosis not present

## 2017-06-26 DIAGNOSIS — M869 Osteomyelitis, unspecified: Secondary | ICD-10-CM | POA: Diagnosis present

## 2017-06-26 DIAGNOSIS — Z794 Long term (current) use of insulin: Secondary | ICD-10-CM

## 2017-06-26 DIAGNOSIS — E1022 Type 1 diabetes mellitus with diabetic chronic kidney disease: Secondary | ICD-10-CM | POA: Diagnosis present

## 2017-06-26 DIAGNOSIS — Z888 Allergy status to other drugs, medicaments and biological substances status: Secondary | ICD-10-CM

## 2017-06-26 DIAGNOSIS — J9600 Acute respiratory failure, unspecified whether with hypoxia or hypercapnia: Secondary | ICD-10-CM

## 2017-06-26 DIAGNOSIS — Z1623 Resistance to quinolones and fluoroquinolones: Secondary | ICD-10-CM | POA: Diagnosis present

## 2017-06-26 DIAGNOSIS — E1042 Type 1 diabetes mellitus with diabetic polyneuropathy: Secondary | ICD-10-CM | POA: Diagnosis present

## 2017-06-26 DIAGNOSIS — Z960 Presence of urogenital implants: Secondary | ICD-10-CM | POA: Diagnosis not present

## 2017-06-26 DIAGNOSIS — Z7189 Other specified counseling: Secondary | ICD-10-CM | POA: Diagnosis not present

## 2017-06-26 DIAGNOSIS — A4101 Sepsis due to Methicillin susceptible Staphylococcus aureus: Secondary | ICD-10-CM | POA: Diagnosis present

## 2017-06-26 DIAGNOSIS — K592 Neurogenic bowel, not elsewhere classified: Secondary | ICD-10-CM | POA: Diagnosis not present

## 2017-06-26 DIAGNOSIS — M797 Fibromyalgia: Secondary | ICD-10-CM | POA: Diagnosis present

## 2017-06-26 DIAGNOSIS — Z8673 Personal history of transient ischemic attack (TIA), and cerebral infarction without residual deficits: Secondary | ICD-10-CM

## 2017-06-26 DIAGNOSIS — J9690 Respiratory failure, unspecified, unspecified whether with hypoxia or hypercapnia: Secondary | ICD-10-CM

## 2017-06-26 DIAGNOSIS — G4700 Insomnia, unspecified: Secondary | ICD-10-CM | POA: Diagnosis not present

## 2017-06-26 DIAGNOSIS — F322 Major depressive disorder, single episode, severe without psychotic features: Secondary | ICD-10-CM | POA: Diagnosis present

## 2017-06-26 HISTORY — DX: Pressure ulcer of unspecified site, unspecified stage: L89.90

## 2017-06-26 LAB — CBG MONITORING, ED
GLUCOSE-CAPILLARY: 351 mg/dL — AB (ref 65–99)
GLUCOSE-CAPILLARY: 319 mg/dL — AB (ref 65–99)
GLUCOSE-CAPILLARY: 339 mg/dL — AB (ref 65–99)
GLUCOSE-CAPILLARY: 295 mg/dL — AB (ref 65–99)
GLUCOSE-CAPILLARY: 236 mg/dL — AB (ref 65–99)
GLUCOSE-CAPILLARY: 238 mg/dL — AB (ref 65–99)
GLUCOSE-CAPILLARY: 162 mg/dL — AB (ref 65–99)
GLUCOSE-CAPILLARY: 145 mg/dL — AB (ref 65–99)
Glucose-Capillary: 513 mg/dL (ref 65–99)
Glucose-Capillary: 361 mg/dL — ABNORMAL HIGH (ref 65–99)
Glucose-Capillary: 356 mg/dL — ABNORMAL HIGH (ref 65–99)
Glucose-Capillary: 347 mg/dL — ABNORMAL HIGH (ref 65–99)
Glucose-Capillary: 215 mg/dL — ABNORMAL HIGH (ref 65–99)

## 2017-06-26 LAB — BASIC METABOLIC PANEL
ANION GAP: 20 — AB (ref 5–15)
ANION GAP: 8 (ref 5–15)
Anion gap: 8 (ref 5–15)
BUN: 25 mg/dL — ABNORMAL HIGH (ref 6–20)
BUN: 23 mg/dL — ABNORMAL HIGH (ref 6–20)
BUN: 22 mg/dL — AB (ref 6–20)
CALCIUM: 8.2 mg/dL — AB (ref 8.9–10.3)
CALCIUM: 7.7 mg/dL — AB (ref 8.9–10.3)
CALCIUM: 7.8 mg/dL — AB (ref 8.9–10.3)
CO2: 16 mmol/L — ABNORMAL LOW (ref 22–32)
CO2: 22 mmol/L (ref 22–32)
CO2: 24 mmol/L (ref 22–32)
CREATININE: 2.32 mg/dL — AB (ref 0.44–1.00)
Chloride: 95 mmol/L — ABNORMAL LOW (ref 101–111)
Chloride: 99 mmol/L — ABNORMAL LOW (ref 101–111)
Chloride: 99 mmol/L — ABNORMAL LOW (ref 101–111)
Creatinine, Ser: 3.16 mg/dL — ABNORMAL HIGH (ref 0.44–1.00)
Creatinine, Ser: 2.57 mg/dL — ABNORMAL HIGH (ref 0.44–1.00)
GFR calc Af Amer: 24 mL/min — ABNORMAL LOW (ref >60)
GFR calc Af Amer: 27 mL/min — ABNORMAL LOW (ref >60)
GFR, EST AFRICAN AMERICAN: 19 mL/min — AB (ref >60)
GFR, EST NON AFRICAN AMERICAN: 16 mL/min — AB (ref >60)
GFR, EST NON AFRICAN AMERICAN: 21 mL/min — AB (ref >60)
GFR, EST NON AFRICAN AMERICAN: 23 mL/min — AB (ref >60)
GLUCOSE: 262 mg/dL — AB (ref 65–99)
GLUCOSE: 159 mg/dL — AB (ref 65–99)
Glucose, Bld: 382 mg/dL — ABNORMAL HIGH (ref 65–99)
POTASSIUM: 4.4 mmol/L (ref 3.5–5.1)
POTASSIUM: 3.7 mmol/L (ref 3.5–5.1)
POTASSIUM: 3.7 mmol/L (ref 3.5–5.1)
SODIUM: 131 mmol/L — AB (ref 135–145)
SODIUM: 129 mmol/L — AB (ref 135–145)
Sodium: 131 mmol/L — ABNORMAL LOW (ref 135–145)

## 2017-06-26 LAB — I-STAT ARTERIAL BLOOD GAS, ED
ACID-BASE DEFICIT: 1 mmol/L (ref 0.0–2.0)
BICARBONATE: 23 mmol/L (ref 20.0–28.0)
O2 Saturation: 100 %
PH ART: 7.49 — AB (ref 7.350–7.450)
Patient temperature: 94
TCO2: 24 mmol/L (ref 22–32)
pCO2 arterial: 29.4 mmHg — ABNORMAL LOW (ref 32.0–48.0)
pO2, Arterial: 470 mmHg — ABNORMAL HIGH (ref 83.0–108.0)

## 2017-06-26 LAB — CBC WITH DIFFERENTIAL/PLATELET
BASOS ABS: 0 10*3/uL (ref 0.0–0.1)
Basophils Relative: 0 %
EOS ABS: 0 10*3/uL (ref 0.0–0.7)
Eosinophils Relative: 0 %
HEMATOCRIT: 23.3 % — AB (ref 36.0–46.0)
HEMOGLOBIN: 7.2 g/dL — AB (ref 12.0–15.0)
LYMPHS PCT: 23 %
Lymphs Abs: 2.8 10*3/uL (ref 0.7–4.0)
MCH: 26.7 pg (ref 26.0–34.0)
MCHC: 30.9 g/dL (ref 30.0–36.0)
MCV: 86.3 fL (ref 78.0–100.0)
Monocytes Absolute: 0.2 10*3/uL (ref 0.1–1.0)
Monocytes Relative: 2 %
NEUTROS ABS: 9.3 10*3/uL — AB (ref 1.7–7.7)
Neutrophils Relative %: 75 %
Platelets: 268 10*3/uL (ref 150–400)
RBC: 2.7 MIL/uL — ABNORMAL LOW (ref 3.87–5.11)
RDW: 17.6 % — AB (ref 11.5–15.5)
WBC: 12.3 10*3/uL — ABNORMAL HIGH (ref 4.0–10.5)

## 2017-06-26 LAB — URINALYSIS, MICROSCOPIC (REFLEX)
Squamous Epithelial / LPF: NONE SEEN (ref 0–5)
WBC, UA: >50 WBC/hpf (ref 0–5)

## 2017-06-26 LAB — COMPREHENSIVE METABOLIC PANEL
ALBUMIN: 1.5 g/dL — AB (ref 3.5–5.0)
ALK PHOS: 179 U/L — AB (ref 38–126)
ALT: 12 U/L — AB (ref 14–54)
ANION GAP: 26 — AB (ref 5–15)
AST: 33 U/L (ref 15–41)
BILIRUBIN TOTAL: 2.2 mg/dL — AB (ref 0.3–1.2)
BUN: 28 mg/dL — AB (ref 6–20)
CALCIUM: 8.5 mg/dL — AB (ref 8.9–10.3)
CO2: 11 mmol/L — ABNORMAL LOW (ref 22–32)
Chloride: 91 mmol/L — ABNORMAL LOW (ref 101–111)
Creatinine, Ser: 3.62 mg/dL — ABNORMAL HIGH (ref 0.44–1.00)
GFR calc Af Amer: 16 mL/min — ABNORMAL LOW (ref >60)
GFR calc non Af Amer: 14 mL/min — ABNORMAL LOW (ref >60)
GLUCOSE: 524 mg/dL — AB (ref 65–99)
Potassium: 4.2 mmol/L (ref 3.5–5.1)
Sodium: 128 mmol/L — ABNORMAL LOW (ref 135–145)
TOTAL PROTEIN: 5.3 g/dL — AB (ref 6.5–8.1)

## 2017-06-26 LAB — CBC
HEMATOCRIT: 17.7 % — AB (ref 36.0–46.0)
HEMOGLOBIN: 5.9 g/dL — AB (ref 12.0–15.0)
MCH: 27.1 pg (ref 26.0–34.0)
MCHC: 33.3 g/dL (ref 30.0–36.0)
MCV: 81.2 fL (ref 78.0–100.0)
Platelets: 227 10*3/uL (ref 150–400)
RBC: 2.18 MIL/uL — ABNORMAL LOW (ref 3.87–5.11)
RDW: 16.8 % — AB (ref 11.5–15.5)
WBC: 7.7 10*3/uL (ref 4.0–10.5)

## 2017-06-26 LAB — URINALYSIS, ROUTINE W REFLEX MICROSCOPIC
Glucose, UA: 250 mg/dL — AB
KETONES UR: 15 mg/dL — AB
Nitrite: NEGATIVE
PH: 6 (ref 5.0–8.0)
Protein, ur: 100 mg/dL — AB
SPECIFIC GRAVITY, URINE: 1.025 (ref 1.005–1.030)

## 2017-06-26 LAB — I-STAT CG4 LACTIC ACID, ED
Lactic Acid, Venous: 1.81 mmol/L (ref 0.5–1.9)
Lactic Acid, Venous: 2.65 mmol/L (ref 0.5–1.9)

## 2017-06-26 LAB — PROTIME-INR
INR: 1.33
Prothrombin Time: 16.4 seconds — ABNORMAL HIGH (ref 11.4–15.2)

## 2017-06-26 LAB — SEDIMENTATION RATE: SED RATE: 90 mm/h — AB (ref 0–22)

## 2017-06-26 LAB — GLUCOSE, CAPILLARY: GLUCOSE-CAPILLARY: 132 mg/dL — AB (ref 65–99)

## 2017-06-26 LAB — I-STAT CHEM 8, ED
BUN: 22 mg/dL — ABNORMAL HIGH (ref 6–20)
Calcium, Ion: 1.13 mmol/L — ABNORMAL LOW (ref 1.15–1.40)
Chloride: 96 mmol/L — ABNORMAL LOW (ref 101–111)
Creatinine, Ser: 2.3 mg/dL — ABNORMAL HIGH (ref 0.44–1.00)
Glucose, Bld: 270 mg/dL — ABNORMAL HIGH (ref 65–99)
HCT: 17 % — ABNORMAL LOW (ref 36.0–46.0)
HEMOGLOBIN: 5.8 g/dL — AB (ref 12.0–15.0)
Potassium: 3.8 mmol/L (ref 3.5–5.1)
SODIUM: 131 mmol/L — AB (ref 135–145)
TCO2: 23 mmol/L (ref 22–32)

## 2017-06-26 LAB — PREPARE RBC (CROSSMATCH)

## 2017-06-26 LAB — PROCALCITONIN: PROCALCITONIN: 6.21 ng/mL

## 2017-06-26 LAB — LACTIC ACID, PLASMA: LACTIC ACID, VENOUS: 2.7 mmol/L — AB (ref 0.5–1.9)

## 2017-06-26 LAB — APTT: aPTT: 42 seconds — ABNORMAL HIGH (ref 24–36)

## 2017-06-26 LAB — C-REACTIVE PROTEIN: CRP: 16.5 mg/dL — AB (ref <1.0)

## 2017-06-26 MED ORDER — DOCUSATE SODIUM 50 MG/5ML PO LIQD: 100.0000 mg | Freq: Two times a day (BID) | ORAL | Status: DC | PRN

## 2017-06-26 MED ORDER — HEPARIN SODIUM (PORCINE) 5000 UNIT/ML IJ SOLN
5000.0000 [IU] | Freq: Three times a day (TID) | INTRAMUSCULAR | Status: DC
  Administered 2017-06-26 – 2017-07-05 (×27): 5000 [IU] via SUBCUTANEOUS
  Filled 2017-06-26 (×27): qty 1

## 2017-06-26 MED ORDER — SODIUM CHLORIDE 0.9 % IV SOLN
500.0000 mg | Freq: Two times a day (BID) | INTRAVENOUS | Status: DC
  Administered 2017-06-26 – 2017-06-28 (×4): 500 mg via INTRAVENOUS
  Filled 2017-06-26 (×6): qty 0.5

## 2017-06-26 MED ORDER — SODIUM CHLORIDE 0.9 % IV SOLN
2.0000 g | Freq: Three times a day (TID) | INTRAVENOUS | Status: DC
  Administered 2017-06-26: 2 g via INTRAVENOUS
  Filled 2017-06-26 (×3): qty 2

## 2017-06-26 MED ORDER — MIDAZOLAM HCL 2 MG/2ML IJ SOLN: 2.0000 mg | INTRAMUSCULAR | Status: DC | PRN

## 2017-06-26 MED ORDER — ALBUTEROL SULFATE (2.5 MG/3ML) 0.083% IN NEBU: 2.5000 mg | INHALATION_SOLUTION | RESPIRATORY_TRACT | Status: DC | PRN

## 2017-06-26 MED ORDER — SUCCINYLCHOLINE CHLORIDE 20 MG/ML IJ SOLN
INTRAMUSCULAR | Status: AC | PRN
  Administered 2017-06-26: 75 mg via INTRAVENOUS

## 2017-06-26 MED ORDER — LACTATED RINGERS IV BOLUS
1000.0000 mL | Freq: Once | INTRAVENOUS | Status: AC
  Administered 2017-06-26: 1000 mL via INTRAVENOUS

## 2017-06-26 MED ORDER — VITAL HIGH PROTEIN PO LIQD
1000.0000 mL | ORAL | Status: DC
  Administered 2017-06-27: 1000 mL

## 2017-06-26 MED ORDER — DEXTROSE-NACL 5-0.45 % IV SOLN
INTRAVENOUS | Status: DC
  Administered 2017-06-26 (×2): via INTRAVENOUS

## 2017-06-26 MED ORDER — PROPOFOL 1000 MG/100ML IV EMUL
0.0000 ug/kg/min | INTRAVENOUS | Status: DC
  Administered 2017-06-26: 5 ug/kg/min via INTRAVENOUS
  Filled 2017-06-26 (×2): qty 100

## 2017-06-26 MED ORDER — FENTANYL CITRATE (PF) 100 MCG/2ML IJ SOLN: 100.0000 ug | INTRAMUSCULAR | Status: DC | PRN

## 2017-06-26 MED ORDER — ORAL CARE MOUTH RINSE
15.0000 mL | Freq: Four times a day (QID) | OROMUCOSAL | Status: DC
  Administered 2017-06-27: 15 mL via OROMUCOSAL

## 2017-06-26 MED ORDER — SODIUM CHLORIDE 0.9 % IV SOLN: 250.0000 mL | INTRAVENOUS | Status: DC | PRN

## 2017-06-26 MED ORDER — ETOMIDATE 2 MG/ML IV SOLN
INTRAVENOUS | Status: AC | PRN
  Administered 2017-06-26: 15 mg via INTRAVENOUS

## 2017-06-26 MED ORDER — LACTATED RINGERS IV BOLUS (SEPSIS)
1000.0000 mL | Freq: Once | INTRAVENOUS | Status: AC
  Administered 2017-06-26: 1000 mL via INTRAVENOUS

## 2017-06-26 MED ORDER — POTASSIUM CHLORIDE 10 MEQ/100ML IV SOLN
10.0000 meq | INTRAVENOUS | Status: AC
  Administered 2017-06-26: 10 meq via INTRAVENOUS
  Filled 2017-06-26: qty 100

## 2017-06-26 MED ORDER — SODIUM CHLORIDE 0.9 % IV SOLN
INTRAVENOUS | Status: DC
  Administered 2017-06-26: 3 [IU]/h via INTRAVENOUS
  Filled 2017-06-26 (×2): qty 1

## 2017-06-26 MED ORDER — CHLORHEXIDINE GLUCONATE 0.12% ORAL RINSE (MEDLINE KIT)
15.0000 mL | Freq: Two times a day (BID) | OROMUCOSAL | Status: DC
  Administered 2017-06-27 (×2): 15 mL via OROMUCOSAL

## 2017-06-26 MED ORDER — PRO-STAT SUGAR FREE PO LIQD
30.0000 mL | Freq: Two times a day (BID) | ORAL | Status: DC
  Administered 2017-06-27 (×2): 30 mL
  Filled 2017-06-26 (×2): qty 30

## 2017-06-26 MED ORDER — FAMOTIDINE 40 MG/5ML PO SUSR
20.0000 mg | Freq: Every day | ORAL | Status: DC
  Administered 2017-06-27 – 2017-06-28 (×3): 20 mg
  Filled 2017-06-26 (×4): qty 2.5

## 2017-06-26 MED ORDER — NOREPINEPHRINE BITARTRATE 1 MG/ML IV SOLN
0.0000 ug/min | Freq: Once | INTRAVENOUS | Status: AC
  Administered 2017-06-26: 2 ug/min via INTRAVENOUS
  Filled 2017-06-26: qty 4

## 2017-06-26 MED ORDER — SODIUM CHLORIDE 0.9 % IV SOLN
8.0000 mg/kg | INTRAVENOUS | Status: DC
  Administered 2017-06-26: 457.5 mg via INTRAVENOUS
  Filled 2017-06-26 (×2): qty 9.15

## 2017-06-26 MED ORDER — LACTATED RINGERS IV SOLN
INTRAVENOUS | Status: DC
  Administered 2017-06-26 – 2017-06-28 (×3): via INTRAVENOUS

## 2017-06-26 MED ORDER — ONDANSETRON HCL 4 MG/2ML IJ SOLN
4.0000 mg | Freq: Four times a day (QID) | INTRAMUSCULAR | Status: DC | PRN
  Administered 2017-07-01 – 2017-07-04 (×2): 4 mg via INTRAVENOUS
  Filled 2017-06-26 (×2): qty 2

## 2017-06-26 MED ORDER — ACETAMINOPHEN 325 MG PO TABS
650.0000 mg | ORAL_TABLET | ORAL | Status: DC | PRN
  Administered 2017-06-28: 650 mg
  Filled 2017-06-26: qty 2

## 2017-06-26 NOTE — ED Notes (Signed)
1st unit blood finished at 1819, second bad started immediatley

## 2017-06-26 NOTE — ED Notes (Addendum)
Admitting aware of bp. Verbally ordered to start propofol

## 2017-06-26 NOTE — ED Notes (Signed)
Attempted iv stick unsuccessfully  

## 2017-06-26 NOTE — ED Provider Notes (Signed)
5:39 PM Assumed care from Dr. Rhunette Croft, please see their note for full history, physical and decision making until this point. In brief this is a 52 y.o. year old female who presented to the ED tonight with Hypotension and Hyperglycemia     Intubated and admitted for DKA and hypotension.  On review of her labs appears she had a hemoglobin of 7.2.  In the setting of hypotension and given multiple liters of fluid we will repeat hemoglobin do a type and screen as she may be hypotensive because she has a dilutional anemia.    Repeat hemoglobin 5.5 we will administer 2 units of emergency release blood.  Will update ICU team.  Discharge instructions, including strict return precautions for new or worsening symptoms, given. Patient and/or family verbalized understanding and agreement with the plan as described.   Labs, studies and imaging reviewed by myself and considered in medical decision making if ordered. Imaging interpreted by radiology.  Labs Reviewed  COMPREHENSIVE METABOLIC PANEL - Abnormal; Notable for the following components:      Result Value   Sodium 128 (*)    Chloride 91 (*)    CO2 11 (*)    Glucose, Bld 524 (*)    BUN 28 (*)    Creatinine, Ser 3.62 (*)    Calcium 8.5 (*)    Total Protein 5.3 (*)    Albumin 1.5 (*)    ALT 12 (*)    Alkaline Phosphatase 179 (*)    Total Bilirubin 2.2 (*)    GFR calc non Af Amer 14 (*)    GFR calc Af Amer 16 (*)    Anion gap 26 (*)    All other components within normal limits  CBC WITH DIFFERENTIAL/PLATELET - Abnormal; Notable for the following components:   WBC 12.3 (*)    RBC 2.70 (*)    Hemoglobin 7.2 (*)    HCT 23.3 (*)    RDW 17.6 (*)    Neutro Abs 9.3 (*)    All other components within normal limits  URINALYSIS, ROUTINE W REFLEX MICROSCOPIC - Abnormal; Notable for the following components:   APPearance TURBID (*)    Glucose, UA 250 (*)    Hgb urine dipstick LARGE (*)    Bilirubin Urine SMALL (*)    Ketones, ur 15 (*)    Protein, ur 100 (*)    Leukocytes, UA MODERATE (*)    All other components within normal limits  APTT - Abnormal; Notable for the following components:   aPTT 42 (*)    All other components within normal limits  PROTIME-INR - Abnormal; Notable for the following components:   Prothrombin Time 16.4 (*)    All other components within normal limits  SEDIMENTATION RATE - Abnormal; Notable for the following components:   Sed Rate 90 (*)    All other components within normal limits  C-REACTIVE PROTEIN - Abnormal; Notable for the following components:   CRP 16.5 (*)    All other components within normal limits  BASIC METABOLIC PANEL - Abnormal; Notable for the following components:   Sodium 131 (*)    Chloride 95 (*)    CO2 16 (*)    Glucose, Bld 382 (*)    BUN 25 (*)    Creatinine, Ser 3.16 (*)    Calcium 8.2 (*)    GFR calc non Af Amer 16 (*)    GFR calc Af Amer 19 (*)    Anion gap 20 (*)  All other components within normal limits  URINALYSIS, MICROSCOPIC (REFLEX) - Abnormal; Notable for the following components:   Bacteria, UA RARE (*)    Non Squamous Epithelial PRESENT (*)    All other components within normal limits  CBG MONITORING, ED - Abnormal; Notable for the following components:   Glucose-Capillary 513 (*)    All other components within normal limits  CBG MONITORING, ED - Abnormal; Notable for the following components:   Glucose-Capillary 361 (*)    All other components within normal limits  I-STAT CG4 LACTIC ACID, ED - Abnormal; Notable for the following components:   Lactic Acid, Venous 2.65 (*)    All other components within normal limits  CBG MONITORING, ED - Abnormal; Notable for the following components:   Glucose-Capillary 351 (*)    All other components within normal limits  CBG MONITORING, ED - Abnormal; Notable for the following components:   Glucose-Capillary 356 (*)    All other components within normal limits  CBG MONITORING, ED - Abnormal; Notable for  the following components:   Glucose-Capillary 347 (*)    All other components within normal limits  CBG MONITORING, ED - Abnormal; Notable for the following components:   Glucose-Capillary 319 (*)    All other components within normal limits  CBG MONITORING, ED - Abnormal; Notable for the following components:   Glucose-Capillary 339 (*)    All other components within normal limits  CBG MONITORING, ED - Abnormal; Notable for the following components:   Glucose-Capillary 295 (*)    All other components within normal limits  I-STAT ARTERIAL BLOOD GAS, ED - Abnormal; Notable for the following components:   pH, Arterial 7.490 (*)    pCO2 arterial 29.4 (*)    pO2, Arterial 470.0 (*)    All other components within normal limits  CBG MONITORING, ED - Abnormal; Notable for the following components:   Glucose-Capillary 236 (*)    All other components within normal limits  I-STAT CHEM 8, ED - Abnormal; Notable for the following components:   Sodium 131 (*)    Chloride 96 (*)    BUN 22 (*)    Creatinine, Ser 2.30 (*)    Glucose, Bld 270 (*)    Calcium, Ion 1.13 (*)    Hemoglobin 5.8 (*)    HCT 17.0 (*)    All other components within normal limits  CULTURE, BLOOD (ROUTINE X 2)  CULTURE, BLOOD (ROUTINE X 2)  URINE CULTURE  CULTURE, RESPIRATORY (NON-EXPECTORATED)  BASIC METABOLIC PANEL  BASIC METABOLIC PANEL  BASIC METABOLIC PANEL  BASIC METABOLIC PANEL  BASIC METABOLIC PANEL  BASIC METABOLIC PANEL  BASIC METABOLIC PANEL  BASIC METABOLIC PANEL  BLOOD GAS, ARTERIAL  LACTIC ACID, PLASMA  PROCALCITONIN  BASIC METABOLIC PANEL  CBC  PROCALCITONIN  CBC  BLOOD GAS, ARTERIAL  MAGNESIUM  PHOSPHORUS  I-STAT CG4 LACTIC ACID, ED  I-STAT VENOUS BLOOD GAS, ED  CBG MONITORING, ED  TYPE AND SCREEN  PREPARE RBC (CROSSMATCH)    DG Chest Portable 1 View  Final Result    CT Head Wo Contrast  Final Result    DG Chest Port 1 View  Final Result    CT Renal Stone Study    (Results  Pending)  DG Chest Port 1 View    (Results Pending)    No follow-ups on file.    Anureet Bruington, Barbara Cower, MD 06/27/17 [phone]

## 2017-06-26 NOTE — ED Notes (Signed)
Admitting verbally ordering to hold sedation meds for now

## 2017-06-26 NOTE — ED Notes (Signed)
Patient transported to CT 

## 2017-06-26 NOTE — ED Notes (Signed)
Heat therapy applied

## 2017-06-26 NOTE — ED Notes (Signed)
Bear hugger removed.

## 2017-06-26 NOTE — ED Notes (Signed)
Dr.Nanavati made aware of pt Lactic Acid level ED-LAb.

## 2017-06-26 NOTE — ED Notes (Signed)
Pt CBG 356, notified Hayley Banker)

## 2017-06-26 NOTE — Telephone Encounter (Signed)
Aubagio PA approved by [employer] (phone# [phone]). for dates 06/15/17 thru 06/10/18./fim

## 2017-06-26 NOTE — ED Notes (Signed)
Admitting aware that d5 and 0.5 NS was started with insulin drip. Admitting verbally advising to dc d5 and 0.5 NS and continue insulin drip at 14 units until next blood sugar check. Also advised to begin NS

## 2017-06-26 NOTE — Telephone Encounter (Signed)
Spoke with Brittney at [employer] and requested someone call [facility] to provide information.  She sts. she will call/fim

## 2017-06-26 NOTE — ED Triage Notes (Signed)
Pt here from [facility] with c/o hyperglycemia and hypotension , pt has history of MS and frequent UTI

## 2017-06-26 NOTE — Telephone Encounter (Signed)
Angelic with [facility] requesting RN reach out to the pt and or the daughter. Stating they do not have a DPR and would like either the pt or POA to contact at [phone] regarding the Aubagio

## 2017-06-26 NOTE — Sedation Documentation (Signed)
Patient intubated successfully by EDP Rhunette Croft

## 2017-06-26 NOTE — Progress Notes (Signed)
Patient transported to CT and back to trauma A. 

## 2017-06-26 NOTE — ED Notes (Signed)
pharmacy notified about insulin drip

## 2017-06-26 NOTE — ED Notes (Signed)
edp aware of bp  

## 2017-06-26 NOTE — ED Notes (Signed)
Per Dr given emergency blood transfusion, type and screen not ready at this time

## 2017-06-26 NOTE — Progress Notes (Signed)
Pharmacy Antibiotic Note  Debra [name] is a 52 y.o. female admitted on 06/26/2017 with recurrent osteo/sepsis.    Plan: Adjust meropenem to 500 q12 Add dapto 8 mg/kg q48h F/u ID consult for continued dapto Monitor renal fx cx  Weight: 126 lb (57.2 kg)  Temp (24hrs), Avg:95.3 F (35.2 C), Min:92.4 F (33.6 C), Max:98.2 F (36.8 C)  Recent Labs  Lab 06/26/17 0823 06/26/17 0854 06/26/17 1055 06/26/17 1105  WBC 12.3*  --   --   --   CREATININE 3.62*  --  3.16*  --   LATICACIDVEN  --  1.81  --  2.65*    Estimated Creatinine Clearance: 19.2 mL/min (A) (by C-G formula based on SCr of 3.16 mg/dL (H)).    Allergies  Allergen Reactions  . Penicillins Anaphylaxis, Nausea And Vomiting and Rash    Has patient had a PCN reaction causing immediate rash, facial/tongue/throat swelling, SOB or lightheadedness with hypotension: Yes Has patient had a PCN reaction causing severe rash involving mucus membranes or skin necrosis: Yes Has patient had a PCN reaction that required hospitalization No Has patient had a PCN reaction occurring within the last 10 years: Yes If all of the above answers are "NO", then may proceed with Cephalosporin use.   . Lactose Intolerance (Gi) Other (See Comments)  . Pollen Extract Other (See Comments)    Seasonal Allergies  . Tape Rash   Isaac Bliss, PharmD, BCPS, BCCCP Clinical Pharmacist Clinical phone for 06/26/2017 from 7a-3:30p: [phone] If after 3:30p, please call main pharmacy at: [phone] 06/26/2017 2:10 PM

## 2017-06-26 NOTE — ED Notes (Signed)
Second unit of blood finished, Vital signs stable

## 2017-06-26 NOTE — ED Notes (Signed)
edp aware of rectal temp

## 2017-06-26 NOTE — ED Provider Notes (Addendum)
[facility] EMERGENCY DEPARTMENT Provider Note   CSN: [id number] Arrival date & time: 06/26/17  0802     History   Chief Complaint Chief Complaint  Patient presents with  . Hypotension  . Hyperglycemia    HPI Debra [name] is a 52 y.o. female.  HPI  Level 5 caveat for altered mental status.  52 year old female with history of MS, neurogenic bladder with chronic indwelling Foley catheter, large pressure ulcer with complications of osteomyelitis, and diabetes comes in with chief complaint of confusion.  According to the nursing home patient normally is able to hold a conversation and she was last normal last night.  This morning they noted that patient was confused and her blood sugar was high, and her blood pressure was low therefore she was sent to the ER.  Patient is not providing any history at all.  It appears that she was admitted last month for osteomyelitis  Past Medical History:  Diagnosis Date  . Adenomatous colonic polyps   . Anemia    2005  . Anxiety    1990  . Arthritis    "knees, hands" (05/07/2017)  . Asthma    2000  . Benign essential HTN   . Cataract   . Chronic lower back pain   . Depression with psychotic symptoms   . Diabetic ketoacidosis without coma associated with type 1 diabetes mellitus ([place])   . Difficult intubation    narrow airway  . Fibromyalgia 11/07/2016  . Gastroesophageal Reflux Disease (GERD)   . Heart murmur    Birth  . Hyperlipidemia    2005  . Hypertension    1998  . Internal hemorrhoids 04/24/06   on colonoscopy  . Migraine    "used to have them all the time; now maybe q6 months" (05/07/2017)  . Multiple sclerosis ([place])   . Neuropathic bladder   . Neuropathy of the hands & feet   . Restless Leg Syndrome   . Sepsis due to urinary tract infection ([place]) 04/12/2017  . Sleep paralysis   . Stable angina pectoris    2007: cath showing normal cors.   . Stroke 1990   denies residual on 05/07/2017  . Type  I Diabetes Mellitus 1988    Patient Active Problem List   Diagnosis Date Noted  . Sepsis ([place]) 06/26/2017  . Normocytic anemia 05/25/2017  . Decubitus ulcer of coccygeal region, stage IV ([place]) 05/21/2017  . 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 Proteus infection 05/06/2017  . Foley catheter in place on admission 05/06/2017  . Hyperglycemia 05/05/2017  . Bacteriuria 05/05/2017  . Recurrent UTI   . Palliative care encounter   . Goals of care, counseling/discussion   . Pressure injury of skin 04/14/2017  . Aortic atherosclerosis ([place]) 03/14/2017  . Major depressive disorder, single episode, severe without psychosis ([place]) 01/02/2017  . Dysthymia 12/27/2016  . HTN (hypertension)   . Neurogenic bladder   . Labile blood pressure   . Leukocytosis   . Labile blood glucose   . Sleep disturbance   . Multiple sclerosis ([place]) 11/22/2016  . Thoracic root lesion 11/22/2016  . Neuropathic pain   . Leg weakness, bilateral 11/20/2016  . History of CVA (cerebrovascular accident)   . Uncontrolled type 1 diabetes mellitus with diabetic peripheral neuropathy ([place])   . Back pain 10/31/2016  . Stable angina pectoris ([place])   . Vitamin D deficiency 08/31/2014  . 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 Hyperlipidemia 10/06/2013  . Chronic diarrhea 10/19/2010  . Peripheral neuropathy 08/19/2010  . Type 1 diabetes mellitus ([place]) 07/06/2010  . Hypertension 07/06/2010  . Asthma 07/06/2010    Past Surgical History:  Procedure Laterality Date  . BREAST SURGERY Left    biopsy left breast  . CARDIAC CATHETERIZATION  ~ 2007/2008  . COLONOSCOPY W/ BIOPSIES AND POLYPECTOMY    . DEBRIDMENT OF DECUBITUS ULCER N/A 05/21/2017   Procedure: DEBRIDMENT OF SACRAL DECUBITUS ULCER;  Surgeon: Fanny Skates, MD;  Location: [facility];  Service: General;  Laterality: N/A;  . DILATION AND CURETTAGE OF UTERUS  12/29/2010     Surgeon: Osborne Oman, MD;  Location: [facility];  Service: Gynecology  . ENDOMETRIAL ABLATION W/ NOVASURE N/A 12/2009  . EYE SURGERY Bilateral    "lots; laser tto correct broken blood vessels from diabetes"  . FOOT SURGERY Bilateral    "bones fused; bones removed"  . FRACTURE SURGERY    . HYSTEROSCOPY  12/29/2010   Procedure: HYSTEROSCOPY WITH HYDROTHERMAL ABLATION;  Surgeon: Osborne Oman, MD;  Location: [facility];  Service: Gynecology;;  . IUD REMOVAL  12/29/2010   Procedure: INTRAUTERINE DEVICE (IUD) REMOVAL;  Surgeon: Osborne Oman, MD;  Location: [facility];  Service: Gynecology;  Laterality: N/A;  . LAPAROSCOPIC CHOLECYSTECTOMY    . LAPAROSCOPIC TUBAL LIGATION  12/29/2010   Procedure: LAPAROSCOPIC TUBAL LIGATION;  Surgeon: Osborne Oman, MD;  Location: [facility];  Service: Gynecology;  Laterality: Bilateral;  . ORIF ANKLE FRACTURE Right 10/09/2013   Procedure: Open reduction internal fixation right ankle;  Surgeon: Nita Sells, MD;  Location: [facility];  Service: Orthopedics;  Laterality: Right;  Open reduction internal fixation right ankle  . TRIGGER FINGER RELEASE     x 3  . TUBAL LIGATION       OB History    Gravida  2   Para  2   Term  1   Preterm  1   AB  0   Living  2     SAB  0   TAB  0   Ectopic  0   Multiple  0   Live Births  2            Home Medications    Prior to Admission medications   Medication Sig Start Date End Date Taking? Authorizing Provider  acetaminophen (TYLENOL) 325 MG tablet Take 650 mg by mouth every 4 (four) hours as needed for mild pain or fever.    Yes [provider]  amLODipine (NORVASC) 10 MG tablet Take 1 tablet (10 mg total) by mouth daily. 02/14/17 02/14/18 Yes Katherine Roan, MD  aspirin EC 81 MG tablet Take 1 tablet (81 mg total) by mouth daily. 02/14/17  Yes Katherine Roan, MD  atorvastatin (LIPITOR) 40 MG tablet Take 1 tablet (40 mg total) by mouth daily. 02/14/17  Yes Katherine Roan,  MD  Baclofen 5 MG TABS Take 5 mg by mouth 3 (three) times daily. 04/24/17  Yes Mariel Aloe, MD  bethanechol (URECHOLINE) 50 MG tablet Take 1 tablet (50 mg total) by mouth 3 (three) times daily. 02/14/17  Yes Winfrey, Jenne Pane, MD  GLUCAGON HCL, RDNA, IJ Inject 1 mg into the muscle as needed (hypoglycemia-administer for CBG <60).   Yes [provider]  insulin aspart (NOVOLOG) 100 UNIT/ML injection Inject 4-14 Units into the skin 3 (three) times daily before meals. 150-200=4 units 201-250=6  units 241-300=8 units 301-350=10 units 351-400=12 units 401-450=14 units notify PCP if >451   Yes [provider]  insulin glargine (LANTUS) 100 UNIT/ML injection Inject 0.06 mLs (6 Units total) into the skin daily. Patient taking differently: Inject 18 Units into the skin daily.  05/25/17  Yes Santos-Sanchez, Merlene Morse, MD  Lactobacillus (ACIDOPHILUS) 100 MG CAPS Take 100 mg by mouth 2 (two) times daily.   Yes [provider]  lidocaine (LIDODERM) 5 % Place 1 patch onto the skin daily. FOR LOWER BACK PAIN Remove & Discard patch within 12 hours or as directed by MD   Yes [provider]  lisinopril (PRINIVIL,ZESTRIL) 20 MG tablet Take 1 tablet (20 mg total) by mouth daily. 02/15/17  Yes Katherine Roan, MD  loperamide (IMODIUM) 2 MG capsule Take 2 capsules (4 mg total) by mouth as needed for diarrhea or loose stools. 04/24/17  Yes Mariel Aloe, MD  magnesium hydroxide (MILK OF MAGNESIA) 400 MG/5ML suspension Take 30 mLs by mouth daily as needed for mild constipation. 02/14/17  Yes Katherine Roan, MD  meclizine (ANTIVERT) 12.5 MG tablet Take 12.5 mg by mouth 3 (three) times daily as needed for dizziness. per nsg. home MAR, pt. takes 1 tablet 1 hour prior to therapy.   Yes [provider]  metoprolol succinate (TOPROL-XL) 100 MG 24 hr tablet Take 1 tablet (100 mg total) by mouth daily. Take with or immediately following a meal. 02/15/17  Yes Winfrey, Jenne Pane,  MD  mirtazapine (REMERON) 45 MG tablet Take 1 tablet (45 mg total) by mouth at bedtime. 04/24/17  Yes Mariel Aloe, MD  Multiple Vitamin (MULTIVITAMIN) tablet Take 1 tablet by mouth daily.   Yes [provider]  ondansetron (ZOFRAN) 4 MG tablet Take 4 mg by mouth daily.   Yes [provider]  Oxycodone HCl 10 MG TABS Take 1 tablet (10 mg total) by mouth every 6 (six) hours as needed (PRN for post-op pain.). 05/25/17  Yes Zada Finders, MD  pantoprazole (PROTONIX) 40 MG tablet Take 1 tablet (40 mg total) by mouth daily. 02/15/17  Yes Katherine Roan, MD  polyethylene glycol (MIRALAX / GLYCOLAX) packet Take 17 g by mouth daily as needed for mild constipation, moderate constipation or severe constipation. 02/14/17  Yes Katherine Roan, MD  pregabalin (LYRICA) 25 MG capsule Take 25 mg by mouth every 8 (eight) hours.    Yes [provider]  risperiDONE (RISPERDAL) 1 MG tablet Take 1 tablet (1 mg total) at bedtime by mouth. 01/02/17  Yes Gouru, Illene Silver, MD  tamsulosin (FLOMAX) 0.4 MG CAPS capsule Take 1 capsule (0.4 mg total) daily by mouth. 01/03/17  Yes Gouru, Illene Silver, MD  Teriflunomide 14 MG TABS Take 14 mg by mouth daily.   Yes [provider]  valACYclovir (VALTREX) 500 MG tablet Take 500 mg by mouth 2 (two) times daily.   Yes [provider]  collagenase (SANTYL) ointment Apply topically 2 (two) times daily. Patient not taking: Reported on 06/26/2017 05/25/17   Zada Finders, MD  feeding supplement, GLUCERNA SHAKE, (GLUCERNA SHAKE) LIQD Take 237 mLs by mouth 2 (two) times daily between meals. Patient not taking: Reported on 05/05/2017 02/14/17   Katherine Roan, MD  meropenem (MERREM) IVPB Inject 1 g into the vein every 12 (twelve) hours. Indication:  Osteomyelitis Last Day of Therapy:  06/28/17 Labs - Once weekly:  CBC/D and BMP, Labs - Every other week:  ESR and CRP 05/25/17 06/28/17  Welford Roche, MD  protein supplement shake (PREMIER PROTEIN)  LIQD Take 325 mLs (11 oz total) by mouth 2 (two) times daily between meals. Patient not taking: Reported on 06/13/2017 04/24/17   Mariel Aloe, MD  Vitamin D, Ergocalciferol, (DRISDOL) 50000 units CAPS capsule Take 1 capsule (50,000 Units total) by mouth every 7 (seven) days. Patient taking differently: Take 50,000 Units by mouth every 7 (seven) days. On Thursdays 02/15/17   Katherine Roan, MD  zolpidem (AMBIEN) 5 MG tablet Take 1 tablet (5 mg total) by mouth at bedtime as needed for sleep. Patient not taking: Reported on 06/13/2017 04/24/17   Mariel Aloe, MD    Family History Family History  Problem Relation Age of Onset  . Hypertension Mother   . Kidney disease Mother   . Hypertension Father   . Breast cancer Maternal Grandmother   . Prostate cancer Maternal Grandfather   . Ovarian cancer Paternal Grandmother   . Prostate cancer Paternal Grandfather   . Colon cancer Maternal Uncle        Family history of malignant neoplasm of gastrointestinal tract    Social History Social History   Tobacco Use  . Smoking status: Never Smoker  . Smokeless tobacco: Never Used  Substance Use Topics  . Alcohol use: No    Alcohol/week: 0.0 oz  . Drug use: No    Comment: drug addict     Allergies   Penicillins; Lactose intolerance (gi); Pollen extract; and Tape   Review of Systems Review of Systems  Unable to perform ROS: Patient unresponsive    Physical Exam Updated Vital Signs BP (!) 84/49   Pulse 73   Temp (!) 94 F (34.4 C) (Rectal)   Resp 13   Ht 5' 5"  (1.651 m)   Wt 57.2 kg (126 lb)   SpO2 100%   BMI 20.97 kg/m   Physical Exam  Constitutional: She appears well-developed.  HENT:  Head: Atraumatic.  Eyes: EOM are normal.  Neck: Neck supple.  No meningismus  Cardiovascular: Normal rate.  Pulmonary/Chest: Effort normal.  Abdominal: Soft. Bowel sounds are normal. She exhibits no distension. There is no tenderness.  Musculoskeletal: She exhibits deformity. She  exhibits no edema.  Neurological:  Responding to noxious stimuli only.  Weak gag reflex. Contracted lower extremities.  Skin: Skin is warm.  Large sacral decubitus pressure ulcer, stage IV. No drainage appreciated, no foul smell  Nursing note and vitals reviewed.      ED Treatments / Results  Labs (all labs ordered are listed, but only abnormal results are displayed) Labs Reviewed  COMPREHENSIVE METABOLIC PANEL - Abnormal; Notable for the following components:      Result Value   Sodium 128 (*)    Chloride 91 (*)    CO2 11 (*)    Glucose, Bld 524 (*)    BUN 28 (*)    Creatinine, Ser 3.62 (*)    Calcium 8.5 (*)    Total Protein 5.3 (*)    Albumin 1.5 (*)    ALT 12 (*)    Alkaline Phosphatase 179 (*)    Total Bilirubin 2.2 (*)    GFR calc non Af Amer 14 (*)    GFR calc Af Amer 16 (*)    Anion gap 26 (*)    All other components within normal limits  CBC WITH DIFFERENTIAL/PLATELET - Abnormal; Notable for the following components:   WBC 12.3 (*)    RBC 2.70 (*)    Hemoglobin 7.2 (*)  HCT 23.3 (*)    RDW 17.6 (*)    Neutro Abs 9.3 (*)    All other components within normal limits  URINALYSIS, ROUTINE W REFLEX MICROSCOPIC - Abnormal; Notable for the following components:   APPearance TURBID (*)    Glucose, UA 250 (*)    Hgb urine dipstick LARGE (*)    Bilirubin Urine SMALL (*)    Ketones, ur 15 (*)    Protein, ur 100 (*)    Leukocytes, UA MODERATE (*)    All other components within normal limits  APTT - Abnormal; Notable for the following components:   aPTT 42 (*)    All other components within normal limits  PROTIME-INR - Abnormal; Notable for the following components:   Prothrombin Time 16.4 (*)    All other components within normal limits  SEDIMENTATION RATE - Abnormal; Notable for the following components:   Sed Rate 90 (*)    All other components within normal limits  C-REACTIVE PROTEIN - Abnormal; Notable for the following components:   CRP 16.5 (*)     All other components within normal limits  BASIC METABOLIC PANEL - Abnormal; Notable for the following components:   Sodium 131 (*)    Chloride 95 (*)    CO2 16 (*)    Glucose, Bld 382 (*)    BUN 25 (*)    Creatinine, Ser 3.16 (*)    Calcium 8.2 (*)    GFR calc non Af Amer 16 (*)    GFR calc Af Amer 19 (*)    Anion gap 20 (*)    All other components within normal limits  URINALYSIS, MICROSCOPIC (REFLEX) - Abnormal; Notable for the following components:   Bacteria, UA RARE (*)    Non Squamous Epithelial PRESENT (*)    All other components within normal limits  CBG MONITORING, ED - Abnormal; Notable for the following components:   Glucose-Capillary 513 (*)    All other components within normal limits  CBG MONITORING, ED - Abnormal; Notable for the following components:   Glucose-Capillary 361 (*)    All other components within normal limits  I-STAT CG4 LACTIC ACID, ED - Abnormal; Notable for the following components:   Lactic Acid, Venous 2.65 (*)    All other components within normal limits  CBG MONITORING, ED - Abnormal; Notable for the following components:   Glucose-Capillary 351 (*)    All other components within normal limits  CBG MONITORING, ED - Abnormal; Notable for the following components:   Glucose-Capillary 356 (*)    All other components within normal limits  CBG MONITORING, ED - Abnormal; Notable for the following components:   Glucose-Capillary 347 (*)    All other components within normal limits  CBG MONITORING, ED - Abnormal; Notable for the following components:   Glucose-Capillary 319 (*)    All other components within normal limits  CBG MONITORING, ED - Abnormal; Notable for the following components:   Glucose-Capillary 339 (*)    All other components within normal limits  CBG MONITORING, ED - Abnormal; Notable for the following components:   Glucose-Capillary 295 (*)    All other components within normal limits  I-STAT ARTERIAL BLOOD GAS, ED - Abnormal;  Notable for the following components:   pH, Arterial 7.490 (*)    pCO2 arterial 29.4 (*)    pO2, Arterial 470.0 (*)    All other components within normal limits  CULTURE, BLOOD (ROUTINE X 2)  CULTURE, BLOOD (ROUTINE X 2)  URINE CULTURE  CULTURE, RESPIRATORY (NON-EXPECTORATED)  BASIC METABOLIC PANEL  BASIC METABOLIC PANEL  BASIC METABOLIC PANEL  BASIC METABOLIC PANEL  BASIC METABOLIC PANEL  BASIC METABOLIC PANEL  BASIC METABOLIC PANEL  BASIC METABOLIC PANEL  BLOOD GAS, ARTERIAL  LACTIC ACID, PLASMA  PROCALCITONIN  BASIC METABOLIC PANEL  CBC  I-STAT CG4 LACTIC ACID, ED  I-STAT VENOUS BLOOD GAS, ED  CBG MONITORING, ED  I-STAT CHEM 8, ED  TYPE AND SCREEN    EKG EKG Interpretation  Date/Time:  Tuesday June 26 2017 08:06:24 EDT Ventricular Rate:  62 PR Interval:    QRS Duration: 131 QT Interval:  489 QTC Calculation: 497 R Axis:   65 Text Interpretation:  Sinus rhythm Nonspecific intraventricular conduction delay ST elev, probable normal early repol pattern No acute changes Nonspecific ST and T wave abnormality Confirmed by Varney Biles [phone]) on 06/26/2017 8:45:09 AM   Radiology Ct Head Wo Contrast  Result Date: 06/26/2017 CLINICAL DATA:  Altered mental status.  Hyperglycemia.  Hypotension. EXAM: CT HEAD WITHOUT CONTRAST TECHNIQUE: Contiguous axial images were obtained from the base of the skull through the vertex without intravenous contrast. COMPARISON:  April 15, 2017 FINDINGS: Brain: The ventricles are normal in size and configuration. There is no intracranial mass, hemorrhage, extra-axial fluid collection, or midline shift. There is patchy small vessel disease in the centra semiovale bilaterally. Elsewhere gray-white compartments appear normal. No acute infarct evident. Vascular: There is no hyperdense vessel. There is calcification in each carotid siphon region. Skull: Bony calvarium appears intact. Sinuses/Orbits: There are air-fluid levels in each sphenoid  sinus. There is mucosal thickening in multiple ethmoid air cells. There is mucosal thickening in both maxillary antra with a retention cyst in the inferior left maxillary antrum. Orbits appear symmetric bilaterally. Other: Mastoid air cells are clear. IMPRESSION: Patchy periventricular small vessel disease. No acute infarct. No mass or hemorrhage. Foci of arterial vascular calcification noted. Multifocal paranasal sinus disease with air-fluid levels in each sphenoid sinus. Electronically Signed   By: Lowella Grip III M.D.   On: 06/26/2017 12:27   Dg Chest Portable 1 View  Result Date: 06/26/2017 CLINICAL DATA:  Intubation. EXAM: PORTABLE CHEST 1 VIEW COMPARISON:  06/26/2017. FINDINGS: Endotracheal tube noted with tip 1.3 cm above the carina. NG tube noted with tip below the left hemidiaphragm. Right PICC line stable position. Cardiomegaly with normal pulmonary vascularity. Linear density noted the left mid chest may represent skin fold. A component of atelectatic changes in the lung bases cannot be excluded. Mild right perihilar infiltrate cannot be excluded. No prominent scratch pleural effusion cannot be excluded. No pneumothorax. IMPRESSION: 1. Interim intubation with endotracheal tube tip 1.3 cm above the carina. NG tube noted with tip below left hemidiaphragm. Right PICC line stable position. 2. Linear density over the left chest most likely skin fold. Bibasilar atelectasis. Right perihilar infiltrate cannot be excluded. Electronically Signed   By: Marcello Moores  Register   On: 06/26/2017 15:05   Dg Chest Port 1 View  Result Date: 06/26/2017 CLINICAL DATA:  Hypotension. EXAM: PORTABLE CHEST 1 VIEW COMPARISON:  04/17/2017. FINDINGS: Interim removal of endotracheal and NG tubes. Right PICC line noted with tip over cavoatrial junction. Stable cardiomegaly mild venous congestion. Vascularity. No focal infiltrate. No pleural effusion or pneumothorax. Thoracic spine degenerative changes scoliosis. IMPRESSION:  1.  Interim removal of endotracheal and NG tubes. 2.  Stable cardiomegaly and mild venous congestion. Electronically Signed   By: Marcello Moores  Register   On: 06/26/2017 09:30    Procedures  Procedure Name: Intubation Date/Time: 06/26/2017 2:57 PM Performed by: Varney Biles, MD Pre-anesthesia Checklist: Patient identified, Patient being monitored, Emergency Drugs available, Timeout performed and Suction available Oxygen Delivery Method: Non-rebreather mask Preoxygenation: Pre-oxygenation with 100% oxygen Induction Type: Rapid sequence Ventilation: Mask ventilation without difficulty Laryngoscope Size: Mac and 3 Grade View: Grade III Tube size: 7.5 mm Number of attempts: 2 Placement Confirmation: ETT inserted through vocal cords under direct vision,  CO2 detector and Breath sounds checked- equal and bilateral Tube secured with: ETT holder       CRITICAL CARE Performed by: Tura Roller   Total critical care time: 95 minutes  Critical care time was exclusive of separately billable procedures and treating other patients.  Critical care was necessary to treat or prevent imminent or life-threatening deterioration.  Critical care was time spent personally by me on the following activities: development of treatment plan with patient and/or surrogate as well as nursing, discussions with consultants, evaluation of patient's response to treatment, examination of patient, obtaining history from patient or surrogate, ordering and performing treatments and interventions, ordering and review of laboratory studies, ordering and review of radiographic studies, pulse oximetry and re-evaluation of patient's condition.   (including critical care time)    Medications Ordered in ED Medications  insulin regular (NOVOLIN R,HUMULIN R) 100 Units in sodium chloride 0.9 % 100 mL (1 Units/mL) infusion (14 Units/hr Intravenous Rate/Dose Change 06/26/17 1523)  lactated ringers infusion ( Intravenous New  Bag/Given 06/26/17 1429)  potassium chloride 10 mEq in 100 mL IVPB (0 mEq Intravenous Stopped 06/26/17 1405)  dextrose 5 %-0.45 % sodium chloride infusion ( Intravenous Stopped 06/26/17 1544)  meropenem (MERREM) 500 mg in sodium chloride 0.9 % 100 mL IVPB (has no administration in time range)  DAPTOmycin (CUBICIN) 457.5 mg in sodium chloride 0.9 % IVPB (457.5 mg Intravenous New Bag/Given 06/26/17 1620)  chlorhexidine gluconate (MEDLINE KIT) (PERIDEX) 0.12 % solution 15 mL (has no administration in time range)  MEDLINE mouth rinse (has no administration in time range)  albuterol (PROVENTIL) (2.5 MG/3ML) 0.083% nebulizer solution 2.5 mg (has no administration in time range)  feeding supplement (VITAL HIGH PROTEIN) liquid 1,000 mL (has no administration in time range)  feeding supplement (PRO-STAT SUGAR FREE 64) liquid 30 mL (has no administration in time range)  famotidine (PEPCID) 40 MG/5ML suspension 20 mg (has no administration in time range)  0.9 %  sodium chloride infusion (has no administration in time range)  heparin injection 5,000 Units (has no administration in time range)  ondansetron (ZOFRAN) injection 4 mg (has no administration in time range)  acetaminophen (TYLENOL) tablet 650 mg (has no administration in time range)  fentaNYL (SUBLIMAZE) injection 100 mcg (has no administration in time range)  fentaNYL (SUBLIMAZE) injection 100 mcg (has no administration in time range)  midazolam (VERSED) injection 2 mg (has no administration in time range)  midazolam (VERSED) injection 2 mg (has no administration in time range)  docusate (COLACE) 50 MG/5ML liquid 100 mg (has no administration in time range)  norepinephrine (LEVOPHED) 4 mg in dextrose 5 % 250 mL (0.016 mg/mL) infusion (has no administration in time range)  lactated ringers bolus 1,000 mL (0 mLs Intravenous Stopped 06/26/17 0959)    And  lactated ringers bolus 1,000 mL (0 mLs Intravenous Stopped 06/26/17 0959)  lactated ringers bolus  1,000 mL (0 mLs Intravenous Stopped 06/26/17 1131)  etomidate (AMIDATE) injection (15 mg Intravenous Given 06/26/17 1419)  succinylcholine (ANECTINE) injection (75 mg Intravenous Given 06/26/17 1419)  lactated ringers  bolus 1,000 mL (0 mLs Intravenous Stopped 06/26/17 1529)     Initial Impression / Assessment and Plan / ED Course  I have reviewed the triage vital signs and the nursing notes.  Pertinent labs & imaging results that were available during my care of the patient were reviewed by me and considered in my medical decision making (see chart for details).  Clinical Course as of Jun 27 1650  Tue Jun 26, 2017  1220 Patient continues to be minimally responsive. Ct HEAD just completed. She is also hypothermic still despite being on bear hugger. BP has responded. DKA medications started. Sepsis reassessment completed. Pt continues to be questionable sepsis.    [AN]  1312 Reassessment x2 more completed in the last hour and a half. She is now status post CT head. I spoke with internal medicine unassigned, they think that because patient has a weak gag it is better idea to have patient be seen by critical care as well.   [AN]  1340 Patient is in DKA with anion gap of 26 and bicarb of 11.  Patient had already received 2 L of IV fluid.  We will add 1 more liter of LR.  Appropriate potassium has been ordered.  Insulin drip initiated.  Anion gap(!): 26 [AN]  1341 Patient is noted to be having AKI.  Again, fluid has been initiated.   Creatinine(!): 3.62 [AN]  1342 CRP and sed rate both are elevated.  Patient has received meropenem to cover for osteomyelitis.   CRP(!): 16.5 [AN]  1432 Pt's mentation has worsened. Will intubate. CCM agrees. Pt not protecting airway.   [AN]  1650 I spoke with the patient's family member, who is in the process of getting POA. She was getting a medical procedure completed herself and therefore was unable to attend.  She will follow-up with the ICU team  tomorrow.  She is made aware of patient requiring high level of care and being admitted to the hospital.   [AN]  1651 Patient's blood pressure has dropped again despite receiving at least 4 L of IV fluid.  At this time we will start patient on pressors. Nursing team notified.  BP(!): 84/49 [AN]  1651 Sepsis reassessment completed.   [AN]    Clinical Course User Index [AN] Varney Biles, MD     52 year old female comes in with chief complaint of altered mental status.  Patient was also noted to be hypo-intensive and hyperglycemic.  Patient is unable to provide any meaningful history.  She is noted to be responding to noxious stimuli and has a weak gag reflex.  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  Patient's last dose was on 4/26.   Patient has multiple nidus for infection, most notably the pressure ulcer, chronic indwelling Foley catheter, PICC line.  We will get basic work-up started and start patient on meropenem because there is high suspicion for infection.  In addition patient is noted to be hyperglycemic and she could be in DKA.  Finally, differential diagnosis also includes brain bleed, meningitis/encephalitis, acute ischemic stroke.  Final Clinical Impressions(s) / ED Diagnoses   Final diagnoses:  Diabetic ketoacidosis with coma associated with other specified diabetes mellitus ([place])  AKI (acute kidney injury) ([place])  Altered mental status, unspecified altered mental status type    ED Discharge Orders    None       Sumayya Muha,  MD 06/26/17 [place], Alesi Zachery, MD 06/26/17 6045    Varney Biles, MD 06/26/17 4098

## 2017-06-26 NOTE — H&P (Addendum)
PULMONARY / CRITICAL CARE MEDICINE   Name: Debra [name] MRN: [id number] DOB: 04/23/1965    ADMISSION DATE:  06/26/2017 CONSULTATION DATE:  06/26/17  REFERRING MD:  EDP / Dr. Kathrynn Humble   CHIEF COMPLAINT:  Altered mental status, hypotension   HISTORY OF PRESENT ILLNESS:   52 y/o F, SNF resident, who presented to [facility] ER on 4/30 with reports of altered mental status.    At baseline, the patient lives at a SNF in the setting of multiple sclerosis, neurogenic bladder, DM, frequent UTI's & sacral ulcer with osteomyelitis.  She was recently admitted from 3/9-3/29 for VRE + Proteus bacteremia, abscess in gluteal muscles, osteo & hyperglycemia.  She is normally alert and communicative at baseline.  She was reportedly normal the evening prior to admit. The patient was reportedly found with elevated glucose, hypotension and altered mental status.  The patient was found to be hypothermic with a temp of 92 degrees on arrival.   Initial work up notable for AMS, UA notable for 250 glucose, large Hgb, 15 ketones, 100 protein, rare bacteria, budding yeast, >50 WBC, no squamous epithelial's. Na 131, K 4.4, Cl 95, CO2 16, glucose 382, BUN 25, Sr Cr 3.16, AG 20, lactic acid 2.65, WBC 12.3, Hgb 7.2, platelets 268, sed rate 90.  The patient was intubated in ER for altered mental status.  CXR without infiltrate.  RUE PICC line in good position.  The patient was pan cultured.  Antibiotics initiated. Insulin gtt started.  The patient was treated with 4L LR with improvement in blood pressure.     PCCM called for ICU admission.   PAST MEDICAL HISTORY :  She  has a past medical history of Adenomatous colonic polyps, Anemia, Anxiety, Arthritis, Asthma, Benign essential HTN, Cataract, Chronic lower back pain, Depression with psychotic symptoms, Diabetic ketoacidosis without coma associated with type 1 diabetes mellitus ([place]), Difficult intubation, Fibromyalgia (11/07/2016), Gastroesophageal Reflux Disease (GERD), Heart  murmur, Hyperlipidemia, Hypertension, Internal hemorrhoids (04/24/06), Migraine, Multiple sclerosis ([place]), Neuropathic bladder, Neuropathy of the hands & feet, Restless Leg Syndrome, Sepsis due to urinary tract infection ([place]) (04/12/2017), Sleep paralysis, Stable angina pectoris, Stroke (1990), and Type I Diabetes Mellitus (1988).  PAST SURGICAL HISTORY: She  has a past surgical history that includes Trigger finger release; Laparoscopic tubal ligation (12/29/2010); IUD removal (12/29/2010); Hysteroscopy (12/29/2010); Dilation and curettage of uterus (12/29/2010); Endometrial ablation w/ novasure (N/A, 12/2009); Breast surgery (Left); Colonoscopy w/ biopsies and polypectomy; ORIF ankle fracture (Right, 10/09/2013); Foot surgery (Bilateral); Tubal ligation; Laparoscopic cholecystectomy; Fracture surgery; Eye surgery (Bilateral); Cardiac catheterization (~ 2007/2008); and Debridment of decubitus ulcer (N/A, 05/21/2017).  Allergies  Allergen Reactions  . Penicillins Anaphylaxis, Nausea And Vomiting and Rash    Has patient had a PCN reaction causing immediate rash, facial/tongue/throat swelling, SOB or lightheadedness with hypotension: Yes Has patient had a PCN reaction causing severe rash involving mucus membranes or skin necrosis: Yes Has patient had a PCN reaction that required hospitalization No Has patient had a PCN reaction occurring within the last 10 years: Yes If all of the above answers are "NO", then may proceed with Cephalosporin use.   . Lactose Intolerance (Gi) Other (See Comments)  . Pollen Extract Other (See Comments)    Seasonal Allergies  . Tape Rash    No current facility-administered medications on file prior to encounter.    Current Outpatient Medications on File Prior to Encounter  Medication Sig  . acetaminophen (TYLENOL) 325 MG tablet Take 650 mg by mouth every 4 (four) hours  as needed for mild pain or fever.   Marland Kitchen amLODipine (NORVASC) 10 MG tablet Take 1 tablet (10 mg total) by  mouth daily.  Marland Kitchen aspirin EC 81 MG tablet Take 1 tablet (81 mg total) by mouth daily.  Marland Kitchen atorvastatin (LIPITOR) 40 MG tablet Take 1 tablet (40 mg total) by mouth daily.  . Baclofen 5 MG TABS Take 5 mg by mouth 3 (three) times daily.  . bethanechol (URECHOLINE) 50 MG tablet Take 1 tablet (50 mg total) by mouth 3 (three) times daily.  Marland Kitchen GLUCAGON HCL, RDNA, IJ Inject 1 mg into the muscle as needed (hypoglycemia-administer for CBG <60).  . insulin aspart (NOVOLOG) 100 UNIT/ML injection Inject 4-14 Units into the skin 3 (three) times daily before meals. 150-200=4 units 201-250=6 units 241-300=8 units 301-350=10 units 351-400=12 units 401-450=14 units notify PCP if >451  . insulin glargine (LANTUS) 100 UNIT/ML injection Inject 0.06 mLs (6 Units total) into the skin daily. (Patient taking differently: Inject 18 Units into the skin daily. )  . Lactobacillus (ACIDOPHILUS) 100 MG CAPS Take 100 mg by mouth 2 (two) times daily.  Marland Kitchen lidocaine (LIDODERM) 5 % Place 1 patch onto the skin daily. FOR LOWER BACK PAIN Remove & Discard patch within 12 hours or as directed by MD  . lisinopril (PRINIVIL,ZESTRIL) 20 MG tablet Take 1 tablet (20 mg total) by mouth daily.  Marland Kitchen loperamide (IMODIUM) 2 MG capsule Take 2 capsules (4 mg total) by mouth as needed for diarrhea or loose stools.  . magnesium hydroxide (MILK OF MAGNESIA) 400 MG/5ML suspension Take 30 mLs by mouth daily as needed for mild constipation.  . meclizine (ANTIVERT) 12.5 MG tablet Take 12.5 mg by mouth 3 (three) times daily as needed for dizziness. per nsg. home MAR, pt. takes 1 tablet 1 hour prior to therapy.  . metoprolol succinate (TOPROL-XL) 100 MG 24 hr tablet Take 1 tablet (100 mg total) by mouth daily. Take with or immediately following a meal.  . mirtazapine (REMERON) 45 MG tablet Take 1 tablet (45 mg total) by mouth at bedtime.  . Multiple Vitamin (MULTIVITAMIN) tablet Take 1 tablet by mouth daily.  . ondansetron (ZOFRAN) 4 MG tablet Take 4 mg by  mouth daily.  . Oxycodone HCl 10 MG TABS Take 1 tablet (10 mg total) by mouth every 6 (six) hours as needed (PRN for post-op pain.).  Marland Kitchen pantoprazole (PROTONIX) 40 MG tablet Take 1 tablet (40 mg total) by mouth daily.  . polyethylene glycol (MIRALAX / GLYCOLAX) packet Take 17 g by mouth daily as needed for mild constipation, moderate constipation or severe constipation.  . pregabalin (LYRICA) 25 MG capsule Take 25 mg by mouth every 8 (eight) hours.   . risperiDONE (RISPERDAL) 1 MG tablet Take 1 tablet (1 mg total) at bedtime by mouth.  . tamsulosin (FLOMAX) 0.4 MG CAPS capsule Take 1 capsule (0.4 mg total) daily by mouth.  . Teriflunomide 14 MG TABS Take 14 mg by mouth daily.  . valACYclovir (VALTREX) 500 MG tablet Take 500 mg by mouth 2 (two) times daily.  . collagenase (SANTYL) ointment Apply topically 2 (two) times daily. (Patient not taking: Reported on 06/26/2017)  . feeding supplement, GLUCERNA SHAKE, (GLUCERNA SHAKE) LIQD Take 237 mLs by mouth 2 (two) times daily between meals. (Patient not taking: Reported on 05/05/2017)  . meropenem (MERREM) IVPB Inject 1 g into the vein every 12 (twelve) hours. Indication:  Osteomyelitis Last Day of Therapy:  06/28/17 Labs - Once weekly:  CBC/D and BMP, Labs -  Every other week:  ESR and CRP  . protein supplement shake (PREMIER PROTEIN) LIQD Take 325 mLs (11 oz total) by mouth 2 (two) times daily between meals. (Patient not taking: Reported on 06/13/2017)  . Vitamin D, Ergocalciferol, (DRISDOL) 50000 units CAPS capsule Take 1 capsule (50,000 Units total) by mouth every 7 (seven) days. (Patient taking differently: Take 50,000 Units by mouth every 7 (seven) days. On Thursdays)  . zolpidem (AMBIEN) 5 MG tablet Take 1 tablet (5 mg total) by mouth at bedtime as needed for sleep. (Patient not taking: Reported on 06/13/2017)    FAMILY HISTORY:  Her indicated that her mother is deceased. She indicated that her father is alive. She indicated that her maternal  grandmother is alive. She indicated that her maternal grandfather is deceased. She indicated that her paternal grandmother is deceased. She indicated that her paternal grandfather is deceased. She indicated that the status of her maternal uncle is unknown.   SOCIAL HISTORY: She  reports that she has never smoked. She has never used smokeless tobacco. She reports that she does not drink alcohol or use drugs.  REVIEW OF SYSTEMS:  Unable to complete as patient is altered / intubated.   SUBJECTIVE:   VITAL SIGNS: BP 119/61   Pulse 65   Temp 98.2 F (36.8 C) (Rectal)   Resp 13   Ht _0  (1.651 m)   Wt 126 lb (57.2 kg)   SpO2 100%   BMI 20.97 kg/m   HEMODYNAMICS:    VENTILATOR SETTINGS: Vent Mode: PRVC FiO2 (%):  [100 %] 100 % Set Rate:  [16 bmp] 16 bmp Vt Set:  [460 mL] 460 mL PEEP:  [5 cmH20] 5 cmH20 Plateau Pressure:  [14 cmH20] 14 cmH20  INTAKE / OUTPUT: No intake/output data recorded.  PHYSICAL EXAMINATION: General:  Chronically ill appearing female in NAD, lying in bed  Neuro:  Sedate post intubation  HEENT:  ETT, MM pink/moist, thick clear oral secretions Cardiovascular:  s1s2 rrr, no m/r/g  Lungs:  Even/non-labored, lungs bilaterally clear  Abdomen:  Soft/non-tender, bsx4 active  Musculoskeletal:  No acute deformities  Skin:  Warm/dry, see images below.   Skin images -   Sacral Wound     Right Heel    Left lateral foot    LABS:  BMET Recent Labs  Lab 06/26/17 0823 06/26/17 1055  NA 128* 131*  K 4.2 4.4  CL 91* 95*  CO2 11* 16*  BUN 28* 25*  CREATININE 3.62* 3.16*  GLUCOSE 524* 382*    Electrolytes Recent Labs  Lab 06/26/17 0823 06/26/17 1055  CALCIUM 8.5* 8.2*    CBC Recent Labs  Lab 06/26/17 0823  WBC 12.3*  HGB 7.2*  HCT 23.3*  PLT 268    Coag's Recent Labs  Lab 06/26/17 0823  APTT 42*  INR 1.33    Sepsis Markers Recent Labs  Lab 06/26/17 0854 06/26/17 1105  LATICACIDVEN 1.81 2.65*    ABG No results for  input(s): PHART, PCO2ART, PO2ART in the last 168 hours.  Liver Enzymes Recent Labs  Lab 06/26/17 0823  AST 33  ALT 12*  ALKPHOS 179*  BILITOT 2.2*  ALBUMIN 1.5*    Cardiac Enzymes No results for input(s): TROPONINI, PROBNP in the last 168 hours.  Glucose Recent Labs  Lab 06/26/17 0812 06/26/17 1039 06/26/17 1148 06/26/17 1253 06/26/17 1400 06/26/17 1453  GLUCAP 513* 361* 351* 356* 347* 319*    Imaging Ct Head Wo Contrast  Result Date: 06/26/2017 CLINICAL DATA:  Altered mental status.  Hyperglycemia.  Hypotension. EXAM: CT HEAD WITHOUT CONTRAST TECHNIQUE: Contiguous axial images were obtained from the base of the skull through the vertex without intravenous contrast. COMPARISON:  April 15, 2017 FINDINGS: Brain: The ventricles are normal in size and configuration. There is no intracranial mass, hemorrhage, extra-axial fluid collection, or midline shift. There is patchy small vessel disease in the centra semiovale bilaterally. Elsewhere gray-white compartments appear normal. No acute infarct evident. Vascular: There is no hyperdense vessel. There is calcification in each carotid siphon region. Skull: Bony calvarium appears intact. Sinuses/Orbits: There are air-fluid levels in each sphenoid sinus. There is mucosal thickening in multiple ethmoid air cells. There is mucosal thickening in both maxillary antra with a retention cyst in the inferior left maxillary antrum. Orbits appear symmetric bilaterally. Other: Mastoid air cells are clear. IMPRESSION: Patchy periventricular small vessel disease. No acute infarct. No mass or hemorrhage. Foci of arterial vascular calcification noted. Multifocal paranasal sinus disease with air-fluid levels in each sphenoid sinus. Electronically Signed   By: Lowella Grip III M.D.   On: 06/26/2017 12:27   Dg Chest Port 1 View  Result Date: 06/26/2017 CLINICAL DATA:  Hypotension. EXAM: PORTABLE CHEST 1 VIEW COMPARISON:  04/17/2017. FINDINGS: Interim  removal of endotracheal and NG tubes. Right PICC line noted with tip over cavoatrial junction. Stable cardiomegaly mild venous congestion. Vascularity. No focal infiltrate. No pleural effusion or pneumothorax. Thoracic spine degenerative changes scoliosis. IMPRESSION: 1.  Interim removal of endotracheal and NG tubes. 2.  Stable cardiomegaly and mild venous congestion. Electronically Signed   By: Marcello Moores  Register   On: 06/26/2017 09:30     STUDIES:  CT Head 4/30 >> patchy periventricular small vessel disease, no acute infarct, no mass or hemorrhage, multifocal paranasal sinus disease with air fluid levels CT Renal Study 4/30 >>   CULTURES: BCx2 4/30 >>  Tracheal Aspirate 4/30 >>  UC 4/30 >>   ANTIBIOTICS: Daptomycin 4/30 >>  Meropenem 4/30   SIGNIFICANT EVENTS: 4/30  Admit from SNF with AMS, concern for sepsis, DKA  LINES/TUBES: RUE PICC 3/36 >>  ETT 4/30 >>   DISCUSSION: 52 y/o F with PMH of MS, large sacral decubitus ulcer with osteomyelitis, recent VRE/proteus bacteremia admitted on 4/30 with AMS, concern for sepsis (possible UTI vs sacral wound).    ASSESSMENT / PLAN:  PULMONARY A: Acute Respiratory Insufficiency - in setting of suspected sepsis  P:   PRVC 8 cc/kg Rate 20  Now ABG   CXR in am  PRN albuterol  ABG in am   CARDIOVASCULAR A:  Severe Sepsis  P:  Admit to ICU  LR at 125m/hr  Pressors if needed to support MAP > 65 See ID   RENAL A:   AKI - in setting of sepsis AGMA / Lactic Acidosis P:   Trend BMP / urinary output Replace electrolytes as indicated Avoid nephrotoxic agents, ensure adequate renal perfusion Trend lactate CT renal study pending   GASTROINTESTINAL A:   At Risk Malnutrition  P:   NPO / OGT  Begin TF this PM Pepcid for SUP   HEMATOLOGIC A:   Anemia  P:  Trend CBC Heparin SQ for DVT prophylaxis   INFECTIOUS A:   Severe Sepsis - suspected sources include urine, sacral decubitus / osteomyelitis  Sacral Osteomyelitis    P:   Pan cultures as above  Empiric dapto / meropenem per pharmacy  Trend lactic acid, PCT   ENDOCRINE A:   DKA  DM   P:  Insulin gtt per protocol  CBG per protocol   NEUROLOGIC A:   Acute Metabolic Encephalopathy - in setting of suspected sepsis P:   RASS goal: 0 to -1  PRN fentanyl / versed for sedation  Allow patient to wake > avoid gtt for now  Early PT efforts Daily SBT / WUA    FAMILY  - Updates: Elenor Legato Caren Griffins) [phone]. Called for update via phone.    - Inter-disciplinary family meet or Palliative Care meeting due by:  5/6   CC Time: 66 minutes   Noe Gens, NP-C [facility] Pgr: [phone] or if no answer [phone] 06/26/2017, 2:55 PM

## 2017-06-27 ENCOUNTER — Inpatient Hospital Stay (HOSPITAL_COMMUNITY): Payer: Medicaid Other

## 2017-06-27 DIAGNOSIS — J9601 Acute respiratory failure with hypoxia: Secondary | ICD-10-CM

## 2017-06-27 LAB — BASIC METABOLIC PANEL
ANION GAP: 7 (ref 5–15)
Anion gap: 9 (ref 5–15)
BUN: 23 mg/dL — ABNORMAL HIGH (ref 6–20)
BUN: 22 mg/dL — ABNORMAL HIGH (ref 6–20)
CHLORIDE: 98 mmol/L — AB (ref 101–111)
CHLORIDE: 102 mmol/L (ref 101–111)
CO2: 25 mmol/L (ref 22–32)
CO2: 24 mmol/L (ref 22–32)
Calcium: 8 mg/dL — ABNORMAL LOW (ref 8.9–10.3)
Calcium: 7.7 mg/dL — ABNORMAL LOW (ref 8.9–10.3)
Creatinine, Ser: 2.28 mg/dL — ABNORMAL HIGH (ref 0.44–1.00)
Creatinine, Ser: 2.12 mg/dL — ABNORMAL HIGH (ref 0.44–1.00)
GFR calc Af Amer: 30 mL/min — ABNORMAL LOW (ref >60)
GFR calc non Af Amer: 24 mL/min — ABNORMAL LOW (ref >60)
GFR, EST AFRICAN AMERICAN: 28 mL/min — AB (ref >60)
GFR, EST NON AFRICAN AMERICAN: 26 mL/min — AB (ref >60)
Glucose, Bld: 83 mg/dL (ref 65–99)
Glucose, Bld: 75 mg/dL (ref 65–99)
POTASSIUM: 3.9 mmol/L (ref 3.5–5.1)
POTASSIUM: 4 mmol/L (ref 3.5–5.1)
SODIUM: 132 mmol/L — AB (ref 135–145)
SODIUM: 133 mmol/L — AB (ref 135–145)

## 2017-06-27 LAB — BLOOD GAS, ARTERIAL
Acid-base deficit: 1 mmol/L (ref 0.0–2.0)
BICARBONATE: 22.4 mmol/L (ref 20.0–28.0)
Drawn by: 41977
FIO2: 40
LHR: 16 {breaths}/min
O2 Saturation: 99.3 %
PATIENT TEMPERATURE: 98.6
PCO2 ART: 32.2 mmHg (ref 32.0–48.0)
PEEP: 5 cmH2O
VT: 460 mL
pH, Arterial: 7.455 — ABNORMAL HIGH (ref 7.350–7.450)
pO2, Arterial: 227 mmHg — ABNORMAL HIGH (ref 83.0–108.0)

## 2017-06-27 LAB — GLUCOSE, CAPILLARY
GLUCOSE-CAPILLARY: 109 mg/dL — AB (ref 65–99)
GLUCOSE-CAPILLARY: 118 mg/dL — AB (ref 65–99)
GLUCOSE-CAPILLARY: 230 mg/dL — AB (ref 65–99)
GLUCOSE-CAPILLARY: 77 mg/dL (ref 65–99)
Glucose-Capillary: 94 mg/dL (ref 65–99)
Glucose-Capillary: 73 mg/dL (ref 65–99)
Glucose-Capillary: 84 mg/dL (ref 65–99)
Glucose-Capillary: 175 mg/dL — ABNORMAL HIGH (ref 65–99)
Glucose-Capillary: 159 mg/dL — ABNORMAL HIGH (ref 65–99)

## 2017-06-27 LAB — MAGNESIUM: Magnesium: 1.3 mg/dL — ABNORMAL LOW (ref 1.7–2.4)

## 2017-06-27 LAB — CBC
HCT: 30.5 % — ABNORMAL LOW (ref 36.0–46.0)
HEMOGLOBIN: 10.3 g/dL — AB (ref 12.0–15.0)
MCH: 27.6 pg (ref 26.0–34.0)
MCHC: 33.8 g/dL (ref 30.0–36.0)
MCV: 81.8 fL (ref 78.0–100.0)
PLATELETS: 249 10*3/uL (ref 150–400)
RBC: 3.73 MIL/uL — AB (ref 3.87–5.11)
RDW: 15.6 % — ABNORMAL HIGH (ref 11.5–15.5)
WBC: 12.7 10*3/uL — ABNORMAL HIGH (ref 4.0–10.5)

## 2017-06-27 LAB — BLOOD CULTURE ID PANEL (REFLEXED)
Acinetobacter baumannii: NOT DETECTED
CANDIDA ALBICANS: NOT DETECTED
CANDIDA GLABRATA: NOT DETECTED
CANDIDA KRUSEI: NOT DETECTED
Candida parapsilosis: NOT DETECTED
Candida tropicalis: NOT DETECTED
ENTEROBACTER CLOACAE COMPLEX: NOT DETECTED
ENTEROBACTERIACEAE SPECIES: NOT DETECTED
ESCHERICHIA COLI: NOT DETECTED
Enterococcus species: NOT DETECTED
Haemophilus influenzae: NOT DETECTED
Klebsiella oxytoca: NOT DETECTED
Klebsiella pneumoniae: NOT DETECTED
Listeria monocytogenes: NOT DETECTED
Methicillin resistance: DETECTED — AB
Neisseria meningitidis: NOT DETECTED
PROTEUS SPECIES: NOT DETECTED
PSEUDOMONAS AERUGINOSA: NOT DETECTED
STAPHYLOCOCCUS SPECIES: DETECTED — AB
STREPTOCOCCUS AGALACTIAE: NOT DETECTED
STREPTOCOCCUS PNEUMONIAE: NOT DETECTED
STREPTOCOCCUS PYOGENES: NOT DETECTED
Serratia marcescens: NOT DETECTED
Staphylococcus aureus (BCID): NOT DETECTED
Streptococcus species: NOT DETECTED

## 2017-06-27 LAB — URINE CULTURE: Culture: NO GROWTH

## 2017-06-27 LAB — PROCALCITONIN: PROCALCITONIN: 4.55 ng/mL

## 2017-06-27 LAB — BLOOD PRODUCT ORDER (VERBAL) VERIFICATION

## 2017-06-27 LAB — PHOSPHORUS: PHOSPHORUS: 2.8 mg/dL (ref 2.5–4.6)

## 2017-06-27 LAB — MRSA PCR SCREENING: MRSA by PCR: NEGATIVE

## 2017-06-27 MED ORDER — DEXTROSE 5 % IV SOLN
0.0000 ug/min | INTRAVENOUS | Status: DC
  Filled 2017-06-27: qty 4

## 2017-06-27 MED ORDER — ORAL CARE MOUTH RINSE: 15.0000 mL | Freq: Two times a day (BID) | OROMUCOSAL | Status: DC

## 2017-06-27 MED ORDER — INSULIN DETEMIR 100 UNIT/ML ~~LOC~~ SOLN
10.0000 [IU] | Freq: Two times a day (BID) | SUBCUTANEOUS | Status: DC
  Administered 2017-06-27 – 2017-06-28 (×2): 10 [IU] via SUBCUTANEOUS
  Filled 2017-06-27 (×4): qty 0.1

## 2017-06-27 MED ORDER — CHLORHEXIDINE GLUCONATE 0.12% ORAL RINSE (MEDLINE KIT)
15.0000 mL | Freq: Two times a day (BID) | OROMUCOSAL | Status: DC
  Administered 2017-06-27 – 2017-06-28 (×2): 15 mL via OROMUCOSAL

## 2017-06-27 MED ORDER — VITAL AF 1.2 CAL PO LIQD: 1000.0000 mL | ORAL | Status: DC

## 2017-06-27 MED ORDER — ORAL CARE MOUTH RINSE
15.0000 mL | OROMUCOSAL | Status: DC
  Administered 2017-06-27 (×4): 15 mL via OROMUCOSAL

## 2017-06-27 MED ORDER — INSULIN DETEMIR 100 UNIT/ML ~~LOC~~ SOLN
15.0000 [IU] | Freq: Two times a day (BID) | SUBCUTANEOUS | Status: DC
  Administered 2017-06-27: 15 [IU] via SUBCUTANEOUS
  Filled 2017-06-27 (×2): qty 0.15

## 2017-06-27 MED ORDER — INSULIN ASPART 100 UNIT/ML ~~LOC~~ SOLN
1.0000 [IU] | SUBCUTANEOUS | Status: DC
  Administered 2017-06-27: 3 [IU] via SUBCUTANEOUS
  Administered 2017-06-27 – 2017-06-28 (×2): 2 [IU] via SUBCUTANEOUS
  Administered 2017-06-28: 3 [IU] via SUBCUTANEOUS
  Administered 2017-06-28 – 2017-06-29 (×2): 2 [IU] via SUBCUTANEOUS

## 2017-06-27 MED ORDER — SODIUM CHLORIDE 0.9 % IV BOLUS
1000.0000 mL | Freq: Once | INTRAVENOUS | Status: AC
  Administered 2017-06-27: 1000 mL via INTRAVENOUS

## 2017-06-27 MED ORDER — COLLAGENASE 250 UNIT/GM EX OINT
TOPICAL_OINTMENT | Freq: Every day | CUTANEOUS | Status: DC
  Administered 2017-06-27 – 2017-07-01 (×4): via TOPICAL
  Filled 2017-06-27 (×2): qty 30

## 2017-06-27 NOTE — Progress Notes (Signed)
eLink Physician-Brief Progress Note Patient Name: Debra [name] DOB: 1965/03/06 MRN: [id number]   Date of Service  06/27/2017  HPI/Events of Note  Hypotension - BP = 79/50. LVEF = 60% to 65%.  0eICU Interventions  Will order: 1. Bolus with 0.9 NaCl 1 liter IV over 1 hour now.  2. Restart Norepinephrine IV infusion. Titrate to MAP >= 65.     Intervention Category Major Interventions: Hypotension - evaluation and management  Debra [name] 06/27/2017, 2:22 AM

## 2017-06-27 NOTE — Consult Note (Signed)
WOC Nurse wound consult note Reason for Consult: Multiple wounds Sacral wound is an unstageable pressure injury that measures 11 cm x 8 cm x unknown depth.  It has a tunnel at the 5 o'clock position that passes toward the right buttock that measures 3 cm in length.  The wound is 75% necrotic tissue.  No odor detected on exam.  Plan for this area is Apply Santyl; cover with saline moistened gauze, then dry gauze or ABD pad.  Change daily.    The left lower inner leg has a wound that measures 3 cm x 2 cm x unknown depth.  It is covered with yellow slough.  Plan for this wound is to apply a [employer] thin hydrocolloid and change every 5 days to optimize autolytic debridement and a moist wound environment.  The right inner heel has intact tissue, some of which is hypopigmented.  There is a central area that is grey in color and that area measures 1.5 cm x 1 cm.  The plan for this area is to apply betadine to the area, allow to air dry, and cover with a foam dressing.  Change the foam dressing every 3 days.   Continue the use of the heel lift boots. Monitor the wound area(s) for worsening of condition such as: Signs/symptoms of infection,  Increase in size,  Development of or worsening of odor, Development of pain, or increased pain at the affected locations.  Notify the medical team if any of these develop. Thank you for the consult.  Discussed plan of care with the patient and bedside nurse.  WOC nurse will not follow at this time.  Please re-consult the WOC team if needed.  Helmut Muster, RN, MSN, CWOCN, CNS-BC, pager [phone]

## 2017-06-27 NOTE — Consult Note (Signed)
Patient s/p I&D of sacral wound 05/21/17 by Dr. Derrell Lolling. Readmitted 4/30 with severe sepsis and pyuria. CT renal study with osteomyelitis and small gas collection in R ischial anal fossa. Examined wound. Small tract extending inferiorly, ~5 cm deep. Some skin breakdown noted in first picture, but no induration or areas of fluctuance felt.      I do not feel that there is any need for further surgical intervention at this time. Recommend continuing IV abx and wound care per WOC. If not improving, could repeat CT in 1 week. We will sign off, please call with questions or concerns. Please re-consult as needed.  Wells Guiles , [facility] 06/27/2017, 3:01 PM Pager: [phone] Consults: [phone] Mon-Fri 7:00 am-4:30 pm Sat-Sun 7:00 am-11:30 am

## 2017-06-27 NOTE — Progress Notes (Signed)
Inpatient Diabetes Program Recommendations  AACE/ADA: New Consensus Statement on Inpatient Glycemic Control (2015)  Target Ranges:  Prepandial:   less than 140 mg/dL      Peak postprandial:   less than 180 mg/dL (1-2 hours)      Critically ill patients:  140 - 180 mg/dL  Results for ALEDA, CARMENATE (MRN [id number]) as of 06/27/2017 11:43  Ref. Range 06/26/2017 23:39 06/27/2017 00:46 06/27/2017 01:45 06/27/2017 02:49 06/27/2017 04:35 06/27/2017 07:11 06/27/2017 11:33  Glucose-Capillary Latest Ref Range: 65 - 99 mg/dL 720 (H) 919 (H) 94 73 77 84 118 (H)  Results for JEMINI, REDMAN (MRN [id number]) as of 06/27/2017 11:43  Ref. Range 06/26/2017 08:23  Glucose Latest Ref Range: 65 - 99 mg/dL [facility])   Results for CHAVY, COMRIE (MRN [id number]) as of 06/27/2017 11:43  Ref. Range 12/23/2016 07:12 04/19/2017 07:00  Hemoglobin A1C Latest Ref Range: 4.8 - 5.6 % 11.4 (H) 10.3 (H)   Review of Glycemic Control  Diabetes history: DM1 (makes no insulin; requires basal, correction, and meal coverage insulin) Outpatient Diabetes medications: Lantus 18 units daily, Novolog 4-14 units TID with meals Current orders for Inpatient glycemic control: Levemir 10 units BID, Novolog 1-3 units Q4H  Inpatient Diabetes Program Recommendations: Insulin - Basal: Noted Levemir 15 units was given this morning at 1:38 am and Levemir 10 units BID is now ordered.  HgbA1C: A1C 10.3% on 04/19/17 indicating poor glycemic control. Noted patient is from SNF. Likely needs to have outpatient DM medications adjusted.  Thanks, Orlando Penner, RN, MSN, CDE Diabetes Coordinator Inpatient Diabetes Program [phone] (Team Pager from 8am to 5pm)

## 2017-06-27 NOTE — Progress Notes (Signed)
PHARMACY - PHYSICIAN COMMUNICATION CRITICAL VALUE ALERT - BLOOD CULTURE IDENTIFICATION (BCID)  Debra [name] is an 52 y.o. female who presented to [facility] on 06/26/2017 with AMS and hypotension.  Assessment:  Known recent h/o VRE proteus bacteremia, abscess in gluteal muscle, and osteo, now w/ hypotension and UTI w/ frank pus as well as wounds to sacrum and feet.  Name of physician (or Provider) Contacted: SSommer MD  Current antibiotics: daptomycin and meropenem  Changes to prescribed antibiotics recommended:  Patient is on recommended antibiotics - No changes needed  Results for orders placed or performed during the hospital encounter of 06/26/17  Blood Culture ID Panel (Reflexed) (Collected: 06/26/2017  8:08 AM)  Result Value Ref Range   Enterococcus species NOT DETECTED NOT DETECTED   Listeria monocytogenes NOT DETECTED NOT DETECTED   Staphylococcus species DETECTED (A) NOT DETECTED   Staphylococcus aureus NOT DETECTED NOT DETECTED   Methicillin resistance DETECTED (A) NOT DETECTED   Streptococcus species NOT DETECTED NOT DETECTED   Streptococcus agalactiae NOT DETECTED NOT DETECTED   Streptococcus pneumoniae NOT DETECTED NOT DETECTED   Streptococcus pyogenes NOT DETECTED NOT DETECTED   Acinetobacter baumannii NOT DETECTED NOT DETECTED   Enterobacteriaceae species NOT DETECTED NOT DETECTED   Enterobacter cloacae complex NOT DETECTED NOT DETECTED   Escherichia coli NOT DETECTED NOT DETECTED   Klebsiella oxytoca NOT DETECTED NOT DETECTED   Klebsiella pneumoniae NOT DETECTED NOT DETECTED   Proteus species NOT DETECTED NOT DETECTED   Serratia marcescens NOT DETECTED NOT DETECTED   Haemophilus influenzae NOT DETECTED NOT DETECTED   Neisseria meningitidis NOT DETECTED NOT DETECTED   Pseudomonas aeruginosa NOT DETECTED NOT DETECTED   Candida albicans NOT DETECTED NOT DETECTED   Candida glabrata NOT DETECTED NOT DETECTED   Candida krusei NOT DETECTED NOT DETECTED   Candida  parapsilosis NOT DETECTED NOT DETECTED   Candida tropicalis NOT DETECTED NOT DETECTED    Vernard Gambles, PharmD, BCPS  06/27/2017  6:37 AM

## 2017-06-27 NOTE — Progress Notes (Signed)
Pt transported to Medical ICU

## 2017-06-27 NOTE — Procedures (Signed)
Extubation Procedure Note  Patient Details:   Name: Debra [name] DOB: Sep 08, 1965 MRN: [id number]   Airway Documentation:    Vent end date: 06/27/17 Vent end time: 1553   Evaluation  O2 sats: stable throughout Complications: No apparent complications Patient did tolerate procedure well. Bilateral Breath Sounds: Clear, Diminished   Yes  4l/min [place]  IS , with coaching.  [name] 06/27/2017, 3:54 PM

## 2017-06-27 NOTE — Progress Notes (Signed)
eLink Physician-Brief Progress Note Patient Name: Debra [name] DOB: [birth year] MRN: [id number]   Date of Service  06/27/2017  HPI/Events of Note  Admitted with DKA. Now with HCO3- = 24 and AG = 8.   eICU Interventions  Will order: 1. Levimir insulin 15 units IV Q 12 hours.  2. Q 4 hour standard Novolog SSI.       Intervention Category Major Interventions: Hyperglycemia - active titration of insulin therapy  Sommer,Steven Eugene 06/27/2017, 1:04 AM

## 2017-06-27 NOTE — Progress Notes (Signed)
PULMONARY / CRITICAL CARE MEDICINE   Name: Debra [name] MRN: [id number] DOB: 10-Jan-1966    ADMISSION DATE:  06/26/2017 CONSULTATION DATE:  06/26/17  REFERRING MD:  EDP / Dr. Rhunette Croft   CHIEF COMPLAINT:  Altered mental status, hypotension   HISTORY OF PRESENT ILLNESS:   52 y/o F, SNF resident, who presented to [facility] ER on 4/30 with reports of altered mental status.    At baseline, the patient lives at a SNF in the setting of multiple sclerosis, neurogenic bladder, DM, frequent UTI's & sacral ulcer with osteomyelitis.  She was recently admitted from 3/9-3/29 for VRE + Proteus bacteremia, abscess in gluteal muscles, osteo & hyperglycemia.  She is normally alert and communicative at baseline.  She was reportedly normal the evening prior to admit. The patient was reportedly found with elevated glucose, hypotension and altered mental status.  The patient was found to be hypothermic with a temp of 92 degrees on arrival.   Initial work up notable for AMS, UA notable for 250 glucose, large Hgb, 15 ketones, 100 protein, rare bacteria, budding yeast, >50 WBC, no squamous epithelial's. Na 131, K 4.4, Cl 95, CO2 16, glucose 382, BUN 25, Sr Cr 3.16, AG 20, lactic acid 2.65, WBC 12.3, Hgb 7.2, platelets 268, sed rate 90.  The patient was intubated in ER for altered mental status.  CXR without infiltrate.  RUE PICC line in good position.  The patient was pan cultured.  Antibiotics initiated. Insulin gtt started.  The patient was treated with 4L LR with improvement in blood pressure.         SUBJECTIVE:  52 year old female remains on pressure support.  Extensive sacral decubitus with now questionable fistula. VITAL SIGNS: BP (!) 79/50 (BP Location: Left Arm)   Pulse 87   Temp (!) 97.5 F (36.4 C) (Oral)   Resp 18   Ht 5\' 5"  (1.651 m)   Wt 59.8 kg (131 lb 13.4 oz)   SpO2 98%   BMI 21.94 kg/m   HEMODYNAMICS:    VENTILATOR SETTINGS: Vent Mode: PRVC FiO2 (%):  [30 %-100 %] 30 % Set Rate:  [16  bmp] 16 bmp Vt Set:  [460 mL] 460 mL PEEP:  [5 cmH20] 5 cmH20 Plateau Pressure:  [13 cmH20-16 cmH20] 16 cmH20  INTAKE / OUTPUT: I/O last 3 completed shifts: In: 8389.7 [I.V.:3425.9; NG/GT:72; IV Piggyback:4891.8] Out: 225 [Urine:225]  PHYSICAL EXAMINATION: General: 53 year old female sedated on ventilator HEENT tracheal tube to vent, NG tube tube feedings Neuro: Currently sedated reported to wake up and follow commands CV: Sounds are regular regular rate and rhythm at a 7.5 by PULM: Coarse rhonchi bilaterally QA:ESLP, non-tender, bsx4 active  Extremities: Bilateral lower extremity ulcers are noted, Skin: Deep sacral decubitus is pictured below    Skin images -   Sacral Wound     Right Heel    Left lateral foot    LABS:  BMET Recent Labs  Lab 06/26/17 2223 06/27/17 0156 06/27/17 0437  NA 131* 132* 133*  K 3.7 3.9 4.0  CL 99* 98* 102  CO2 24 25 24   BUN 22* 23* 22*  CREATININE 2.32* 2.28* 2.12*  GLUCOSE 159* 83 75    Electrolytes Recent Labs  Lab 06/26/17 2223 06/27/17 0156 06/27/17 0437  CALCIUM 7.8* 8.0* 7.7*  MG  --   --  1.3*  PHOS  --   --  2.8    CBC Recent Labs  Lab 06/26/17 0823 06/26/17 1704 06/26/17 1723 06/27/17 0437  WBC 12.3*  7.7  --  12.7*  HGB 7.2* 5.9* 5.8* 10.3*  HCT 23.3* 17.7* 17.0* 30.5*  PLT 268 227  --  249    Coag's Recent Labs  Lab 06/26/17 0823  APTT 42*  INR 1.33    Sepsis Markers Recent Labs  Lab 06/26/17 0854 06/26/17 1105 06/26/17 1549 06/26/17 1704 06/27/17 0437  LATICACIDVEN 1.81 2.65*  --  2.7*  --   PROCALCITON  --   --  6.21  --  4.55    ABG Recent Labs  Lab 06/26/17 1548 06/27/17 0330  PHART 7.490* 7.455*  PCO2ART 29.4* 32.2  PO2ART 470.0* 227*    Liver Enzymes Recent Labs  Lab 06/26/17 0823  AST 33  ALT 12*  ALKPHOS 179*  BILITOT 2.2*  ALBUMIN 1.5*    Cardiac Enzymes No results for input(s): TROPONINI, PROBNP in the last 168 hours.  Glucose Recent Labs  Lab  06/26/17 2339 06/27/17 0046 06/27/17 0145 06/27/17 0249 06/27/17 0435 06/27/17 0711  GLUCAP 132* 109* 94 73 77 84    Imaging Ct Head Wo Contrast  Result Date: 06/26/2017 CLINICAL DATA:  Altered mental status.  Hyperglycemia.  Hypotension. EXAM: CT HEAD WITHOUT CONTRAST TECHNIQUE: Contiguous axial images were obtained from the base of the skull through the vertex without intravenous contrast. COMPARISON:  April 15, 2017 FINDINGS: Brain: The ventricles are normal in size and configuration. There is no intracranial mass, hemorrhage, extra-axial fluid collection, or midline shift. There is patchy small vessel disease in the centra semiovale bilaterally. Elsewhere gray-white compartments appear normal. No acute infarct evident. Vascular: There is no hyperdense vessel. There is calcification in each carotid siphon region. Skull: Bony calvarium appears intact. Sinuses/Orbits: There are air-fluid levels in each sphenoid sinus. There is mucosal thickening in multiple ethmoid air cells. There is mucosal thickening in both maxillary antra with a retention cyst in the inferior left maxillary antrum. Orbits appear symmetric bilaterally. Other: Mastoid air cells are clear. IMPRESSION: Patchy periventricular small vessel disease. No acute infarct. No mass or hemorrhage. Foci of arterial vascular calcification noted. Multifocal paranasal sinus disease with air-fluid levels in each sphenoid sinus. Electronically Signed   By: Bretta Bang III M.D.   On: 06/26/2017 12:27   Dg Chest Port 1 View  Result Date: 06/27/2017 CLINICAL DATA:  51 year old female with respiratory insufficiency and sepsis EXAM: PORTABLE CHEST 1 VIEW COMPARISON:  Prior chest x-ray 06/26/2017 FINDINGS: The patient remains intubated. The tip of the endotracheal tube is 2.5 cm above the carina. Stable right upper extremity PICC with the catheter tip overlying the cavoatrial junction. A nasogastric tube is present. The tip lies inferior to the  fundus. The lungs are clear. No focal airspace consolidation, pulmonary edema, pleural effusion or pneumothorax. No acute osseous abnormality. IMPRESSION: 1. No acute cardiopulmonary process. 2. Stable and satisfactory support apparatus. Electronically Signed   By: Malachy Moan M.D.   On: 06/27/2017 09:43   Dg Chest Portable 1 View  Result Date: 06/26/2017 CLINICAL DATA:  Intubation. EXAM: PORTABLE CHEST 1 VIEW COMPARISON:  06/26/2017. FINDINGS: Endotracheal tube noted with tip 1.3 cm above the carina. NG tube noted with tip below the left hemidiaphragm. Right PICC line stable position. Cardiomegaly with normal pulmonary vascularity. Linear density noted the left mid chest may represent skin fold. A component of atelectatic changes in the lung bases cannot be excluded. Mild right perihilar infiltrate cannot be excluded. No prominent scratch pleural effusion cannot be excluded. No pneumothorax. IMPRESSION: 1. Interim intubation with endotracheal tube tip 1.3  cm above the carina. NG tube noted with tip below left hemidiaphragm. Right PICC line stable position. 2. Linear density over the left chest most likely skin fold. Bibasilar atelectasis. Right perihilar infiltrate cannot be excluded. Electronically Signed   By: Maisie Fus  Register   On: 06/26/2017 15:05   Ct Renal Stone Study  Result Date: 06/27/2017 CLINICAL DATA:  Hyperglycemia and hypotension history of MS EXAM: CT ABDOMEN AND PELVIS WITHOUT CONTRAST TECHNIQUE: Multidetector CT imaging of the abdomen and pelvis was performed following the standard protocol without IV contrast. COMPARISON:  MRI 05/18/2017, CT 05/10/2017, 03/14/2017 FINDINGS: Lower chest: Lung bases demonstrate patchy dependent atelectasis. No consolidation or effusion. Heart size borderline enlarged. Hepatobiliary: No focal liver abnormality is seen. Status post cholecystectomy. No biliary dilatation. Pancreas: Unremarkable. No pancreatic ductal dilatation or surrounding inflammatory  changes. Spleen: Normal in size without focal abnormality. Adrenals/Urinary Tract: Adrenal glands are unremarkable. Kidneys are normal, without renal calculi, focal lesion, or hydronephrosis. Thick-walled urinary bladder with air-fluid level and Foley catheter. Stomach/Bowel: Stomach is within normal limits. Appendix appears normal. No evidence of bowel wall thickening, distention, or inflammatory changes. Small amount of radiopaque material within the colon. Vascular/Lymphatic: Moderate aortic atherosclerosis. No aneurysmal dilatation. No significantly enlarged lymph nodes. Reproductive: Bilateral ligation clips.  No adnexal mass. Other: Negative for free air or free fluid. Musculoskeletal: Deep midline to left of midline sacral decubitus ulcer with cortical erosive change at the coccyx consistent with osteomyelitis. Multiple small foci of gas in the right ischial anal fossa, may be contiguous with a linear soft tissue tract extending to the right gluteal region posteriorly, possible fistula. Largest collection measures 19 mm. IMPRESSION: 1. No CT evidence for acute intra-abdominal or pelvic abnormality. 2. Thick-walled appearance of the urinary bladder with air-fluid level and Foley catheter. 3. Deep sacral decubitus ulcer with bony erosive changes involving the coccyx, consistent with osteomyelitis. Multiple small gas and fluid collections in the right ischial anal fossa, possible small abscesses. More posterior collection may be contiguous with a linear soft tissue density that extends toward the right posterior gluteal skin surface and may represent a fistula. Electronically Signed   By: Jasmine Pang M.D.   On: 06/27/2017 00:22     STUDIES:  CT Head 4/30 >> patchy periventricular small vessel disease, no acute infarct, no mass or hemorrhage, multifocal paranasal sinus disease with air fluid levels CT Renal Study 4/30 >> no acute renal issue.  Extensive changes from wound sacrum with possible  fistula.  CULTURES: BCx2 4/30 >>  Tracheal Aspirate 4/30 >>  UC 4/30 >> negative  ANTIBIOTICS: Daptomycin 4/30 >>  Meropenem 4/30   SIGNIFICANT EVENTS: 4/30  Admit from SNF with AMS, concern for sepsis, DKA  LINES/TUBES: RUE PICC 3/36 >>  ETT 4/30 >>   DISCUSSION: 52 y/o F with PMH of MS, large sacral decubitus ulcer with osteomyelitis, recent VRE/proteus bacteremia admitted on 4/30 with AMS, concern for sepsis (possible UTI vs sacral wound).  06/27/2017 CT the renal reveals possible fistula.  ASSESSMENT / PLAN:  PULMONARY A: Acute Respiratory Insufficiency - in setting of suspected sepsis  P:   Ventilator bundle Wean as tolerated   CARDIOVASCULAR A:  Severe Sepsis  P:  Admit ICU Fluid resuscitate Antibiotics Pressors as needed, currently on Levophed 7.5   RENAL Lab Results  Component Value Date   CREATININE 2.12 (H) 06/27/2017   CREATININE 2.28 (H) 06/27/2017   CREATININE 2.32 (H) 06/26/2017   CREATININE 0.79 08/28/2014   CREATININE 0.72 10/06/2013   CREATININE  0.78 06/09/2013   Recent Labs  Lab 06/26/17 2223 06/27/17 0156 06/27/17 0437  K 3.7 3.9 4.0    A:   AKI - in setting of sepsis AGMA / Lactic Acidosis P:   Trend BMP / urinary output Replace electrolytes as indicated Avoid nephrotoxic agents, ensure adequate renal perfusion Trend lactate CT was negative for intra-abdominal or pelvic abnormality it did however show deep sacral decubitus with bony erosive changes fluid collections small abscesses and questionable fistula.  GASTROINTESTINAL A:   At Risk Malnutrition  P:   Tube feeding in place GI protection  HEMATOLOGIC A:   Anemia  Recent Labs    06/26/17 1723 06/27/17 0437  HGB 5.8* 10.3*    P:  Trend C BC Transfuse per protocol   INFECTIOUS A:   Severe Sepsis - suspected sources include urine, sacral decubitus / osteomyelitis  Sacral Osteomyelitis  Lactic Acid, Venous    Component Value Date/Time   LATICACIDVEN 2.7  (HH) 06/26/2017 1704   a  P:   Panculture Continue current antibiotic Trend lactic acid May need surgical consult   ENDOCRINE A:   DKA  DM   P:   Currently off insulin drip CBG is is now 15 Levemir Extina mg every 12 hours on 06/27/2017 will decrease to 10 mg every 12 hours  NEUROLOGIC A:   Acute Metabolic Encephalopathy - in setting of suspected sepsis P:   RA SS goal -1 Limit sedation as possible Does arouse at time.    FAMILY  - Updates: Celine Ahr Aram Beecham) [phone]. Called for update via phone.  06/27/2017 no family at bedside  - Inter-disciplinary family meet or Palliative Care meeting due by:  5/6   CC Time: 35 minutes   Brett Canales Lopez Dentinger ACNP [facility] PCCM Pager [phone] till 1 pm If no answer page [phone] 06/27/2017, 10:24 AM

## 2017-06-27 NOTE — Progress Notes (Signed)
Initial Nutrition Assessment  DOCUMENTATION CODES:   Not applicable  INTERVENTION:  Recommend Vital 1.2 @ 55 ml/hr (1320 ml/24hr) providing 1584 kcal, 99 grams of protein, 1069 ml H2O.   If extubated recommend: Ensure Enlive po BID, each supplement provides 350 kcal and 20 grams of protein  NUTRITION DIAGNOSIS:   Increased nutrient needs related to wound healing, chronic illness as evidenced by estimated needs.  GOAL:   Patient will meet greater than or equal to 90% of their needs  MONITOR:   Vent status, Labs, Weight trends, Skin, TF tolerance, I & O's  REASON FOR ASSESSMENT:   Consult, Ventilator Enteral/tube feeding initiation and management  ASSESSMENT:   52 y.o. F admitted from SNF on 06/26/17 for DKA with altered mental status, hypotension, severe sepsis, chronic sacral wound, acute respiratory failure, acute metabolic encephalopathy, AKI. PMH of osteomyelitis and multiple sclerosis, anemia, HTN, hyperlipidemia, anxitety/depression with psychotic symptoms, sepsis due to UTI 03/2017, T1DM, stroke, arthritis, chronic back pain,  Neuropathy of hands and feet, and asthma.   Per rounds plans to wean and extubate today. Pt awake and alert; nodded when dietetic intern told her she was going to complete a physical exam. No family present at bedside. Per rounds daughter was in room earlier, but could not give a good hx due to only seeing her mom once a month, daughter reports her aunt sees her on a regular basis.   Per MD note and rounds, CT showed that sacral wound has a possible fistula.   Current EN via OG tube: Vital HP @ 40 ml/hr with pro-stat BID. Total regimen providing 1160 kcal, 114 grams of protein, and 806 ml H2O; meeting 73% of kcal needs and 143% protein needs.   Medications reviewed: pepcid, heparin, novolog 1-3 units, levemir 10 units, lactated ringers infusion, merrem, levophed.   Labs reviewed: Na 133 (L), BUN 22 (H), creatinine 2.12 (H), magnesium 1.3 (L), GFR 26  (L), WBC 12.7 (H), RBC 3.73 (L), hemoglobin 10.3 (L), HCT 30.5 (L).    Intake/Output Summary (Last 24 hours) at 06/27/2017 1255 Last data filed at 06/27/2017 1200 Gross per 24 hour  Intake 6287.19 ml  Output 285 ml  Net 6002.19 ml   Lab Results  Component Value Date   HGBA1C 10.3 (H) 04/19/2017   NUTRITION - FOCUSED PHYSICAL EXAM:    Most Recent Value  Orbital Region  No depletion  Upper Arm Region  No depletion  Thoracic and Lumbar Region  Unable to assess  Buccal Region  No depletion  Temple Region  Mild depletion  Clavicle Bone Region  No depletion  Clavicle and Acromion Bone Region  No depletion  Scapular Bone Region  Unable to assess  Dorsal Hand  No depletion  Patellar Region  Unable to assess  Anterior Thigh Region  Unable to assess  Posterior Calf Region  Unable to assess  Edema (RD Assessment)  Unable to assess  Hair  Reviewed  Eyes  Unable to assess  Mouth  Unable to assess  Skin  Reviewed  Nails  Reviewed       Diet Order:   Diet Order           Diet NPO time specified  Diet effective now          EDUCATION NEEDS:   Not appropriate for education at this time  Skin:  Skin Assessment: Skin Integrity Issues: Skin Integrity Issues:: Unstageable, Stage I, Other (Comment) Stage I: L foot, R heel Unstageable: chronic sacral wound Other:  moisture associated skin damage; perineum   Last BM:  unknown  Height:   Ht Readings from Last 1 Encounters:  06/26/17 5\' 5"  (1.651 m)    Weight:   Wt Readings from Last 1 Encounters:  06/27/17 131 lb 13.4 oz (59.8 kg)   UBW: unknown due to weight fluctuations between 150-168 lbs  Ideal Body Weight:  56.82 kg  BMI:  Body mass index is 21.94 kg/m.  Estimated Nutritional Needs:   Kcal:  1600-1800 kcal  Protein:  80-95 grams  Fluid:  1.6 - 1.8 L   Sherrine Maples, Dietetic Intern

## 2017-06-28 ENCOUNTER — Inpatient Hospital Stay (HOSPITAL_COMMUNITY): Payer: Medicaid Other

## 2017-06-28 DIAGNOSIS — G35 Multiple sclerosis: Secondary | ICD-10-CM

## 2017-06-28 DIAGNOSIS — T80219D Unspecified infection due to central venous catheter, subsequent encounter: Secondary | ICD-10-CM

## 2017-06-28 DIAGNOSIS — Z960 Presence of urogenital implants: Secondary | ICD-10-CM

## 2017-06-28 DIAGNOSIS — K599 Functional intestinal disorder, unspecified: Secondary | ICD-10-CM

## 2017-06-28 DIAGNOSIS — E109 Type 1 diabetes mellitus without complications: Secondary | ICD-10-CM

## 2017-06-28 DIAGNOSIS — N39498 Other specified urinary incontinence: Secondary | ICD-10-CM

## 2017-06-28 DIAGNOSIS — M545 Low back pain: Secondary | ICD-10-CM

## 2017-06-28 DIAGNOSIS — A411 Sepsis due to other specified staphylococcus: Secondary | ICD-10-CM | POA: Diagnosis present

## 2017-06-28 DIAGNOSIS — R011 Cardiac murmur, unspecified: Secondary | ICD-10-CM

## 2017-06-28 DIAGNOSIS — Z95828 Presence of other vascular implants and grafts: Secondary | ICD-10-CM

## 2017-06-28 DIAGNOSIS — Z880 Allergy status to penicillin: Secondary | ICD-10-CM

## 2017-06-28 DIAGNOSIS — N319 Neuromuscular dysfunction of bladder, unspecified: Secondary | ICD-10-CM

## 2017-06-28 DIAGNOSIS — Z91048 Other nonmedicinal substance allergy status: Secondary | ICD-10-CM

## 2017-06-28 DIAGNOSIS — M869 Osteomyelitis, unspecified: Secondary | ICD-10-CM

## 2017-06-28 DIAGNOSIS — E739 Lactose intolerance, unspecified: Secondary | ICD-10-CM

## 2017-06-28 DIAGNOSIS — J301 Allergic rhinitis due to pollen: Secondary | ICD-10-CM

## 2017-06-28 LAB — BASIC METABOLIC PANEL
Anion gap: 6 (ref 5–15)
BUN: 22 mg/dL — AB (ref 6–20)
CO2: 25 mmol/L (ref 22–32)
Calcium: 8 mg/dL — ABNORMAL LOW (ref 8.9–10.3)
Chloride: 108 mmol/L (ref 101–111)
Creatinine, Ser: 1.39 mg/dL — ABNORMAL HIGH (ref 0.44–1.00)
GFR calc Af Amer: 50 mL/min — ABNORMAL LOW (ref >60)
GFR, EST NON AFRICAN AMERICAN: 43 mL/min — AB (ref >60)
GLUCOSE: 72 mg/dL (ref 65–99)
Potassium: 3.7 mmol/L (ref 3.5–5.1)
Sodium: 139 mmol/L (ref 135–145)

## 2017-06-28 LAB — GLUCOSE, CAPILLARY
GLUCOSE-CAPILLARY: 81 mg/dL (ref 65–99)
GLUCOSE-CAPILLARY: 49 mg/dL — AB (ref 65–99)
GLUCOSE-CAPILLARY: 54 mg/dL — AB (ref 65–99)
GLUCOSE-CAPILLARY: 108 mg/dL — AB (ref 65–99)
GLUCOSE-CAPILLARY: 236 mg/dL — AB (ref 65–99)
Glucose-Capillary: 50 mg/dL — ABNORMAL LOW (ref 65–99)
Glucose-Capillary: 136 mg/dL — ABNORMAL HIGH (ref 65–99)
Glucose-Capillary: 188 mg/dL — ABNORMAL HIGH (ref 65–99)

## 2017-06-28 LAB — MAGNESIUM: Magnesium: 1.5 mg/dL — ABNORMAL LOW (ref 1.7–2.4)

## 2017-06-28 LAB — PHOSPHORUS: Phosphorus: 2.7 mg/dL (ref 2.5–4.6)

## 2017-06-28 LAB — PROCALCITONIN: PROCALCITONIN: 2.03 ng/mL

## 2017-06-28 MED ORDER — SODIUM CHLORIDE 0.9 % IV SOLN
1.0000 g | Freq: Three times a day (TID) | INTRAVENOUS | Status: DC
  Administered 2017-06-28 – 2017-07-05 (×22): 1 g via INTRAVENOUS
  Filled 2017-06-28 (×25): qty 1

## 2017-06-28 MED ORDER — LOPERAMIDE HCL 1 MG/5ML PO LIQD
1.0000 mg | ORAL | Status: DC | PRN
  Filled 2017-06-28 (×2): qty 5

## 2017-06-28 MED ORDER — MAGNESIUM SULFATE 2 GM/50ML IV SOLN
2.0000 g | Freq: Once | INTRAVENOUS | Status: AC
  Administered 2017-06-28: 2 g via INTRAVENOUS
  Filled 2017-06-28: qty 50

## 2017-06-28 MED ORDER — BOOST / RESOURCE BREEZE PO LIQD CUSTOM
1.0000 | Freq: Two times a day (BID) | ORAL | Status: DC
  Administered 2017-06-28: 1 via ORAL

## 2017-06-28 MED ORDER — CHLORHEXIDINE GLUCONATE 0.12 % MT SOLN
15.0000 mL | Freq: Two times a day (BID) | OROMUCOSAL | Status: DC
  Administered 2017-06-28 – 2017-07-05 (×9): 15 mL via OROMUCOSAL
  Filled 2017-06-28 (×8): qty 15

## 2017-06-28 MED ORDER — DEXTROSE 50 % IV SOLN
INTRAVENOUS | Status: AC
  Filled 2017-06-28: qty 50

## 2017-06-28 MED ORDER — DEXTROSE 50 % IV SOLN
1.0000 | Freq: Once | INTRAVENOUS | Status: AC
  Administered 2017-06-28: 50 mL via INTRAVENOUS

## 2017-06-28 MED ORDER — TAMSULOSIN HCL 0.4 MG PO CAPS
0.4000 mg | ORAL_CAPSULE | Freq: Every day | ORAL | Status: DC
  Administered 2017-06-28 – 2017-07-05 (×7): 0.4 mg via ORAL
  Filled 2017-06-28 (×7): qty 1

## 2017-06-28 MED ORDER — SODIUM CHLORIDE 0.9 % IV SOLN
8.0000 mg/kg | INTRAVENOUS | Status: DC
  Administered 2017-06-28: 457.5 mg via INTRAVENOUS
  Filled 2017-06-28 (×2): qty 9.15

## 2017-06-28 MED ORDER — ENSURE ENLIVE PO LIQD
237.0000 mL | Freq: Two times a day (BID) | ORAL | Status: DC
  Administered 2017-06-28: 237 mL via ORAL

## 2017-06-28 MED ORDER — JEVITY 1.2 CAL PO LIQD
1000.0000 mL | ORAL | Status: DC
  Filled 2017-06-28 (×3): qty 1000

## 2017-06-28 MED ORDER — BETHANECHOL CHLORIDE 25 MG PO TABS
50.0000 mg | ORAL_TABLET | Freq: Three times a day (TID) | ORAL | Status: DC
  Administered 2017-06-28 – 2017-07-05 (×20): 50 mg via ORAL
  Filled 2017-06-28 (×22): qty 2

## 2017-06-28 MED ORDER — ORAL CARE MOUTH RINSE
15.0000 mL | Freq: Two times a day (BID) | OROMUCOSAL | Status: DC
  Administered 2017-06-28: 15 mL via OROMUCOSAL

## 2017-06-28 MED ORDER — ORAL CARE MOUTH RINSE
15.0000 mL | Freq: Two times a day (BID) | OROMUCOSAL | Status: DC
  Administered 2017-06-29 – 2017-07-04 (×5): 15 mL via OROMUCOSAL

## 2017-06-28 NOTE — Progress Notes (Signed)
PULMONARY / CRITICAL CARE MEDICINE   Name: Debra [name] MRN: [id number] DOB: 06-25-1965    ADMISSION DATE:  06/26/2017 CONSULTATION DATE:  4/30  REFERRING MD:  Rhunette Croft  CHIEF COMPLAINT:  Confusion  HISTORY OF PRESENT ILLNESS:   52 y/o lady who lives in a SNF who was brought to the ER with sepsis.  She has baseline multiple sclerosis, neurogenic bladder, frequent UTI's.  She has a large sacral wound and recent history of osteomyelitis.     SUBJECTIVE:  Extubated yesterday  VITAL SIGNS: BP 126/73   Pulse 78   Temp 98.2 F (36.8 C) (Oral)   Resp 14   Ht 5\' 5"  (1.651 m)   Wt 133 lb 9.6 oz (60.6 kg)   SpO2 100%   BMI 22.23 kg/m   HEMODYNAMICS:    VENTILATOR SETTINGS: Vent Mode: PSV;CPAP FiO2 (%):  [30 %] 30 % Set Rate:  [16 bmp] 16 bmp Vt Set:  [460 mL] 460 mL PEEP:  [5 cmH20] 5 cmH20 Pressure Support:  [5 cmH20] 5 cmH20  INTAKE / OUTPUT: I/O last 3 completed shifts: In: 7252.2 [I.V.:5928.4; NG/GT:432; IV Piggyback:891.8] Out: 2025 [Urine:2025]  PHYSICAL EXAMINATION:  General:  Resting comfortably in bed HENT: NCAT OP clear PULM: CTA B, normal effort CV: RRR, no mgr GI: BS+, soft, nontender MSK: normal bulk and tone Neuro: Sleepy but follows commands   LABS:  BMET Recent Labs  Lab 06/27/17 0156 06/27/17 0437 06/28/17 0523  NA 132* 133* 139  K 3.9 4.0 3.7  CL 98* 102 108  CO2 25 24 25   BUN 23* 22* 22*  CREATININE 2.28* 2.12* 1.39*  GLUCOSE 83 75 72    Electrolytes Recent Labs  Lab 06/27/17 0156 06/27/17 0437 06/28/17 0523  CALCIUM 8.0* 7.7* 8.0*  MG  --  1.3* 1.5*  PHOS  --  2.8 2.7    CBC Recent Labs  Lab 06/26/17 0823 06/26/17 1704 06/26/17 1723 06/27/17 0437  WBC 12.3* 7.7  --  12.7*  HGB 7.2* 5.9* 5.8* 10.3*  HCT 23.3* 17.7* 17.0* 30.5*  PLT 268 227  --  249    Coag's Recent Labs  Lab 06/26/17 0823  APTT 42*  INR 1.33    Sepsis Markers Recent Labs  Lab 06/26/17 0854 06/26/17 1105 06/26/17 1549  06/26/17 1704 06/27/17 0437 06/28/17 0523  LATICACIDVEN 1.81 2.65*  --  2.7*  --   --   PROCALCITON  --   --  6.21  --  4.55 2.03    ABG Recent Labs  Lab 06/26/17 1548 06/27/17 0330  PHART 7.490* 7.455*  PCO2ART 29.4* 32.2  PO2ART 470.0* 227*    Liver Enzymes Recent Labs  Lab 06/26/17 0823  AST 33  ALT 12*  ALKPHOS 179*  BILITOT 2.2*  ALBUMIN 1.5*    Cardiac Enzymes No results for input(s): TROPONINI, PROBNP in the last 168 hours.  Glucose Recent Labs  Lab 06/28/17 0410 06/28/17 0735 06/28/17 0805 06/28/17 0806 06/28/17 0812 06/28/17 0843  GLUCAP 81 54* 50* 49* 40* 136*    Imaging Dg Chest Port 1 View  Result Date: 06/28/2017 CLINICAL DATA:  52 year old female admitted with severe sepsis, pyuria, sacral wound, osteomyelitis. EXAM: PORTABLE CHEST 1 VIEW COMPARISON:  06/27/2017 and earlier. FINDINGS: Portable AP semi upright view at 0354 hours. Extubated and enteric tube removed. Mildly lower lung volumes, mild left lung base retrocardiac opacity. Stable right upper extremity approach PICC line. Stable cardiac size and mediastinal contours. No pneumothorax, pulmonary edema, pleural effusion or  other confluent opacity. No acute osseous abnormality identified. IMPRESSION: 1. Extubated and enteric tube removed. 2. Mildly lower lung volumes with mild left lung base atelectasis. No other acute cardiopulmonary abnormality. Electronically Signed   By: Odessa Fleming M.D.   On: 06/28/2017 07:25     STUDIES:  5/1 CT abdomen> no acute intra-abdominal process, large sacral wound with abscesses and air nearby  CULTURES: 4/30 blood > coag neg staph 3/4 4/30 resp cult > neg 4/30 urine cult >   ANTIBIOTICS: 4/30 dapto >  4/30 mero >   SIGNIFICANT EVENTS:   LINES/TUBES:   DISCUSSION: 52 y/o female with MS, bedbound here with sepsis from MRSE infection and sacral wound  ASSESSMENT / PLAN:  PULMONARY A: Acute respiratory failure> resolved P:   Monitor O2  saturation  CARDIOVASCULAR A:  Septic shock resolved Hypertension P:  Tele Monitor hemodynamics Hold home BP meds today  RENAL A:   AKI> better Neurogenic bladder P:   Monitor BMET and UOP Replace electrolytes as needed Decrease LR to 50cc/hr Restart bethanecol and flomax  GASTROINTESTINAL A:   No acute issues P:   Advance diet  HEMATOLOGIC A:   Anemia without bleeding P:  Monitor for bleeding  INFECTIOUS A:   MRSE bacteremia/PICC infection Sacral wound infection, likely osteomyelitis P:   Remove PICC Line holiday Wound care Continue IV antibiotics per ID ID consult today  ENDOCRINE A:   Hyperglycemia/DM2 P:   Continue detemir and aspart  NEUROLOGIC A:   MS bedbound P:   Restart teriflunomide if available   FAMILY  - Updates: none bedside To floor  [place], MD [place] P[facility]M Pager: [phone] Cell: [phone] After 3pm or if no response, call [phone]   06/28/2017, 11:09 AM

## 2017-06-28 NOTE — Progress Notes (Signed)
Inpatient Diabetes Program Recommendations  AACE/ADA: New Consensus Statement on Inpatient Glycemic Control (2015)  Target Ranges:  Prepandial:   less than 140 mg/dL      Peak postprandial:   less than 180 mg/dL (1-2 hours)      Critically ill patients:  140 - 180 mg/dL   Results for Debra [name], Debra [name] (MRN [id number]) as of 06/28/2017 10:24  Ref. Range 06/28/2017 07:35 06/28/2017 08:05 06/28/2017 08:06 06/28/2017 08:12 06/28/2017 08:43  Glucose-Capillary Latest Ref Range: 65 - 99 mg/dL 54 (L) 50 (L) 49 (L) 40 (LL) 136 (H)  Results for Debra [name], Debra [name] (MRN [id number]) as of 06/28/2017 10:24  Ref. Range 06/27/2017 07:11 06/27/2017 11:33 06/27/2017 15:24 06/27/2017 19:14 06/27/2017 23:13 06/28/2017 04:10  Glucose-Capillary Latest Ref Range: 65 - 99 mg/dL 84 093 (H) 235 (H) 573 (H) 159 (H) 81   Review of Glycemic Control  Diabetes history: DM1 (makes no insulin; requires basal, correction, and meal coverage insulin) Outpatient Diabetes medications: Lantus 18 units daily, Novolog 4-14 units TID with meals Current orders for Inpatient glycemic control: Levemir 10 units BID, Novolog 1-3 units Q4H  Inpatient Diabetes Program Recommendations: Insulin - Basal: Noted diet is now ordered and tube feeding has been stopped. Fasting glucose 54 mg/dl this morning at 2:20UR.  Please decreasing Levemir to 6 units QHS. HgbA1C: A1C 10.3% on 04/19/17 indicating poor glycemic control. Noted patient is from SNF. Likely needs to have outpatient DM medications adjusted.    Thanks, Orlando Penner, RN, MSN, CDE Diabetes Coordinator Inpatient Diabetes Program [phone] (Team Pager from 8am to 5pm)

## 2017-06-28 NOTE — Consult Note (Signed)
[facility]    Date of Admission:  06/26/2017   Total days of antibiotics 3       Reason for Consult: Sepsis, bacteremia, and osteomyelitis     Referring Provider: Dr. Kendrick Fries  Primary Care Provider: Dr. Heide Spark   Assessment:  Debra [name] is a 52 y.o. female with a complicated medical history including debilitating MS that has led to urinary/bowel dysfunction and loss of function of both lower extremities, chronic indwelling foley catheter, T1DM, and recent admission 04/2016 for VRE/Proteus bacteremia and sacral osteomyelitis at which time she was discharged on 5 weeks of meropenem (last dose 4/26) who presented on 4/30 with sepsis thought to be secondary to sacral osteomyelitis and DKA and required intubation and admission to the ICU. No further debridement indicated per surgery.    4/30 Bcx with MRSE 3/4 4/30 Ucx with no growth 5/1 Tracheal aspirate cx pending    Plan: 1. Remove PICC line (likely source) 2. Continue daptomycin and meropenem  3. Repeat blood cx 5/2  Active Problems:   AKI (acute kidney injury) (HCC)   Diabetic acidosis (HCC)   Severe sepsis (HCC)   Scheduled Meds: . collagenase   Topical Daily  . dextrose      . famotidine  20 mg Per Tube Daily  . heparin  5,000 Units Subcutaneous Q8H  . insulin aspart  1-3 Units Subcutaneous Q4H  . insulin detemir  10 Units Subcutaneous Q12H  . mouth rinse  15 mL Mouth Rinse BID   Continuous Infusions: . sodium chloride    . DAPTOmycin (CUBICIN)  IV Stopped (06/26/17 1717)  . dextrose 5 % and 0.45% NaCl Stopped (06/27/17 0146)  . feeding supplement (VITAL AF 1.2 CAL)    . lactated ringers 125 mL/hr at 06/28/17 0228  . meropenem (MERREM) IV 500 mg (06/28/17 0802)  . norepinephrine (LEVOPHED) Adult infusion Stopped (06/27/17 1024)   PRN Meds:.sodium chloride, acetaminophen, ondansetron (ZOFRAN) IV  HPI: Debra [name] is a 52 y.o. female with a complicated medical  history including debilitating MS that has led to urinary/bowel dysfunction and loss of function of both lower extremities, chronic indwelling foley catheter, T1DM, and recent admission 04/2016 for VRE/Proteus bacteremia and sacral osteomyelitis at which time she was discharged on 5 weeks of meropenem who presented on 4/30 with sepsis thought to be secondary to sacral osteomyelitis and DKA and required intubation and admission to the ICU.CT renal study showed multiple small gas and fluid collection in thie R ischial anal fossa, possible small abscesses. Surgery consulted and no indication for further debridement at this time. Last dose of meropenem was supposed to be on 4/26. She was started on meropenem and dapto. She was extubated 5/1 and is currently stable and afebrile. She is not very interactive this morning, but does complain of pain in her lower back.    Review of Systems: Review of Systems  Constitutional: Negative for chills and fever.  Respiratory: Negative for cough and shortness of breath.   Cardiovascular: Negative for chest pain and palpitations.  Gastrointestinal: Negative for abdominal pain, constipation and diarrhea.  Genitourinary: Negative for dysuria.  Musculoskeletal: Positive for back pain and myalgias.  Skin:       Sacral wound   Neurological: Negative for dizziness and headaches.       Chronic lower extremity weakness 2/2 MS     Past Medical History:  Diagnosis Date  . Adenomatous colonic polyps   .  Anemia    2005  . Anxiety    1990  . Arthritis    "knees, hands" (05/07/2017)  . Asthma    2000  . Benign essential HTN   . Cataract   . Chronic lower back pain   . Depression with psychotic symptoms   . Diabetic ketoacidosis without coma associated with type 1 diabetes mellitus (HCC)   . Difficult intubation    narrow airway  . Fibromyalgia 11/07/2016  . Gastroesophageal Reflux Disease (GERD)   . Heart murmur    Birth  . Hyperlipidemia    2005  .  Hypertension    1998  . Internal hemorrhoids 04/24/06   on colonoscopy  . Migraine    "used to have them all the time; now maybe q6 months" (05/07/2017)  . Multiple sclerosis (HCC)   . Neuropathic bladder   . Neuropathy of the hands & feet   . Restless Leg Syndrome   . Sepsis due to urinary tract infection (HCC) 04/12/2017  . Sleep paralysis   . Stable angina pectoris    2007: cath showing normal cors.   . Stroke 1990   denies residual on 05/07/2017  . Type I Diabetes Mellitus 1988    Social History   Tobacco Use  . Smoking status: Never Smoker  . Smokeless tobacco: Never Used  Substance Use Topics  . Alcohol use: No    Alcohol/week: 0.0 oz  . Drug use: No    Comment: drug addict    Family History  Problem Relation Age of Onset  . Hypertension Mother   . Kidney disease Mother   . Hypertension Father   . Breast cancer Maternal Grandmother   . Prostate cancer Maternal Grandfather   . Ovarian cancer Paternal Grandmother   . Prostate cancer Paternal Grandfather   . Colon cancer Maternal Uncle        Family history of malignant neoplasm of gastrointestinal tract   Allergies  Allergen Reactions  . Penicillins Anaphylaxis, Nausea And Vomiting and Rash    Has patient had a PCN reaction causing immediate rash, facial/tongue/throat swelling, SOB or lightheadedness with hypotension: Yes Has patient had a PCN reaction causing severe rash involving mucus membranes or skin necrosis: Yes Has patient had a PCN reaction that required hospitalization No Has patient had a PCN reaction occurring within the last 10 years: Yes If all of the above answers are "NO", then may proceed with Cephalosporin use.   . Lactose Intolerance (Gi) Other (See Comments)  . Pollen Extract Other (See Comments)    Seasonal Allergies  . Tape Rash    OBJECTIVE: Blood pressure 102/67, pulse 78, temperature 98.2 F (36.8 C), temperature source Oral, resp. rate 12, height 5\' 5"  (1.651 m), weight 133 lb 9.6  oz (60.6 kg), SpO2 100 %.  Physical Exam  Constitutional: She is oriented to person, place, and time.  Chronically-ill appearing female, appears older than stated age, resting comfortably in bed   HENT:  Head: Normocephalic and atraumatic.  Mouth/Throat: Oropharynx is clear and moist.  Eyes: Pupils are equal, round, and reactive to light. Conjunctivae are normal.  Cardiovascular: Normal rate and regular rhythm.  Murmur (II/VI SEM (known)) heard. Pulmonary/Chest: Effort normal and breath sounds normal. No respiratory distress. She has no wheezes. She has no rales.  Abdominal: Soft. Bowel sounds are normal. She exhibits no distension. There is no tenderness.  Musculoskeletal: She exhibits edema (Mild LUE edema).  Neurological: She is alert and oriented to person, place, and time.  1-2/5 strength on bilateral lower extremities (not new)  Psychiatric:  Very depressed affect and minimally interactive this AM     Lab Results Lab Results  Component Value Date   WBC 12.7 (H) 06/27/2017   HGB 10.3 (L) 06/27/2017   HCT 30.5 (L) 06/27/2017   MCV 81.8 06/27/2017   PLT 249 06/27/2017    Lab Results  Component Value Date   CREATININE 1.39 (H) 06/28/2017   BUN 22 (H) 06/28/2017   NA 139 06/28/2017   K 3.7 06/28/2017   CL 108 06/28/2017   CO2 25 06/28/2017    Lab Results  Component Value Date   ALT 12 (L) 06/26/2017   AST 33 06/26/2017   ALKPHOS 179 (H) 06/26/2017   BILITOT 2.2 (H) 06/26/2017     Microbiology: Recent Results (from the past 240 hour(s))  Blood Culture (routine x 2)     Status: Abnormal (Preliminary result)   Collection Time: 06/26/17  8:08 AM  Result Value Ref Range Status   Specimen Description BLOOD RIGHT ANTECUBITAL  Final   Special Requests   Final    BOTTLES DRAWN AEROBIC AND ANAEROBIC Blood Culture adequate volume   Culture  Setup Time   Final    IN BOTH AEROBIC AND ANAEROBIC BOTTLES GRAM POSITIVE COCCI CRITICAL RESULT CALLED TO, READ BACK BY AND  VERIFIED WITH: V BRYK PHARMD 06/27/17 0606 JDW    Culture (A)  Final    STAPHYLOCOCCUS SPECIES (COAGULASE NEGATIVE) THE SIGNIFICANCE OF ISOLATING THIS ORGANISM FROM A SINGLE SET OF BLOOD CULTURES WHEN MULTIPLE SETS ARE DRAWN IS UNCERTAIN. PLEASE NOTIFY THE MICROBIOLOGY DEPARTMENT WITHIN ONE WEEK IF SPECIATION AND SENSITIVITIES ARE REQUIRED. Performed at [facility], [address], [place], [place] [address]    Report Status PENDING  Incomplete  Blood Culture ID Panel (Reflexed)     Status: Abnormal   Collection Time: 06/26/17  8:08 AM  Result Value Ref Range Status   Enterococcus species NOT DETECTED NOT DETECTED Final   Listeria monocytogenes NOT DETECTED NOT DETECTED Final   Staphylococcus species DETECTED (A) NOT DETECTED Final    Comment: Methicillin (oxacillin) resistant coagulase negative staphylococcus. Possible blood culture contaminant (unless isolated from more than one blood culture draw or clinical case suggests pathogenicity). No antibiotic treatment is indicated for blood  culture contaminants. CRITICAL RESULT CALLED TO, READ BACK BY AND VERIFIED WITH: V BRYK PHARMD 06/27/17 0606 JDW    Staphylococcus aureus NOT DETECTED NOT DETECTED Final   Methicillin resistance DETECTED (A) NOT DETECTED Final    Comment: CRITICAL RESULT CALLED TO, READ BACK BY AND VERIFIED WITH: V BRYK PHARMD 06/27/17 0606 JDW    Streptococcus species NOT DETECTED NOT DETECTED Final   Streptococcus agalactiae NOT DETECTED NOT DETECTED Final   Streptococcus pneumoniae NOT DETECTED NOT DETECTED Final   Streptococcus pyogenes NOT DETECTED NOT DETECTED Final   Acinetobacter baumannii NOT DETECTED NOT DETECTED Final   Enterobacteriaceae species NOT DETECTED NOT DETECTED Final   Enterobacter cloacae complex NOT DETECTED NOT DETECTED Final   Escherichia coli NOT DETECTED NOT DETECTED Final   Klebsiella oxytoca NOT DETECTED NOT DETECTED Final   Klebsiella pneumoniae NOT DETECTED NOT DETECTED Final    Proteus species NOT DETECTED NOT DETECTED Final   Serratia marcescens NOT DETECTED NOT DETECTED Final   Haemophilus influenzae NOT DETECTED NOT DETECTED Final   Neisseria meningitidis NOT DETECTED NOT DETECTED Final   Pseudomonas aeruginosa NOT DETECTED NOT DETECTED Final   Candida albicans NOT DETECTED NOT DETECTED Final  Candida glabrata NOT DETECTED NOT DETECTED Final   Candida krusei NOT DETECTED NOT DETECTED Final   Candida parapsilosis NOT DETECTED NOT DETECTED Final   Candida tropicalis NOT DETECTED NOT DETECTED Final    Comment: Performed at [facility], [address], [place], [place] [address]  Blood Culture (routine x 2)     Status: None (Preliminary result)   Collection Time: 06/26/17  9:40 AM  Result Value Ref Range Status   Specimen Description BLOOD LEFT ANTECUBITAL  Final   Special Requests   Final    BOTTLES DRAWN AEROBIC AND ANAEROBIC Blood Culture adequate volume   Culture  Setup Time   Final    AEROBIC BOTTLE ONLY GRAM POSITIVE COCCI CRITICAL VALUE NOTED.  VALUE IS CONSISTENT WITH PREVIOUSLY REPORTED AND CALLED VALUE. Performed at [facility], [address], [place], [place] [address]    Culture GRAM POSITIVE COCCI  Final   Report Status PENDING  Incomplete  Urine culture     Status: None   Collection Time: 06/26/17 10:30 AM  Result Value Ref Range Status   Specimen Description URINE, CATHETERIZED  Final   Special Requests NONE  Final   Culture   Final    NO GROWTH Performed at [facility], [address] [place], [place] [address]    Report Status 06/27/2017 FINAL  Final  MRSA PCR Screening     Status: None   Collection Time: 06/27/17  1:56 AM  Result Value Ref Range Status   MRSA by PCR NEGATIVE NEGATIVE Final    Comment:        The GeneXpert MRSA Assay (FDA approved for NASAL specimens only), is one component of a comprehensive MRSA colonization surveillance program. It is not intended to diagnose MRSA infection nor to guide  or monitor treatment for MRSA infections. Performed at [facility], [address], [place], [place] [address]     [name], MD [facility] [phone] pager   [phone] cell 06/28/2017, 9:27 AM

## 2017-06-28 NOTE — Progress Notes (Addendum)
Pt w/ CBG of 54 at 0745.  of juice given, will recheck CBG @ 0800  Rechecked CBG from both hands (50, 49)  and central line (40) @0800 .  One amp of D50 given, will recheck in 15 min  CBG rechecked and now 135, will cont to monitor

## 2017-06-28 NOTE — Progress Notes (Signed)
Pharmacy Antibiotic Note  Debra [name] is a 52 y.o. female admitted on 06/26/2017 with recurrent osteo/sepsis. 3/4 BCx has grown MRSE. ID planning to take out PICC with line holiday. Currently on d#3 of abx. SCr has improved today to 1.39  Plan: Adjust meropenem to 1 gm q8h Increase dapto 8 mg/kg q24h F/u ID consult for continued dapto Monitor renal fx cx  Height: 5\' 5"  (165.1 cm) Weight: 133 lb 9.6 oz (60.6 kg) IBW/kg (Calculated) : 57  Temp (24hrs), Avg:98.2 F (36.8 C), Min:98 F (36.7 C), Max:98.3 F (36.8 C)  Recent Labs  Lab 06/26/17 0823 06/26/17 0854  06/26/17 1105 06/26/17 1704 06/26/17 1723 06/26/17 2223 06/27/17 0156 06/27/17 0437 06/28/17 0523  WBC 12.3*  --   --   --  7.7  --   --   --  12.7*  --   CREATININE 3.62*  --    < >  --  2.57* 2.30* 2.32* 2.28* 2.12* 1.39*  LATICACIDVEN  --  1.81  --  2.65* 2.7*  --   --   --   --   --    < > = values in this interval not displayed.    Estimated Creatinine Clearance: 43.6 mL/min (A) (by C-G formula based on SCr of 1.39 mg/dL (H)).    Allergies  Allergen Reactions  . Penicillins Anaphylaxis, Nausea And Vomiting and Rash    Has patient had a PCN reaction causing immediate rash, facial/tongue/throat swelling, SOB or lightheadedness with hypotension: Yes Has patient had a PCN reaction causing severe rash involving mucus membranes or skin necrosis: Yes Has patient had a PCN reaction that required hospitalization No Has patient had a PCN reaction occurring within the last 10 years: Yes If all of the above answers are "NO", then may proceed with Cephalosporin use.   . Lactose Intolerance (Gi) Other (See Comments)  . Pollen Extract Other (See Comments)    Seasonal Allergies  . Tape Rash   Vinnie Level, PharmD., BCPS Clinical Pharmacist Clinical phone for 06/28/17 until 3:30pm: [phone] If after 3:30pm, please call main pharmacy at: [phone]

## 2017-06-28 NOTE — Progress Notes (Signed)
Attempted to call report to receiving RN, 6N. RN unable to receive report at this time. Provided call back number. Spoke w/ pts aunt Aram Beecham and provided new room info

## 2017-06-28 NOTE — Progress Notes (Addendum)
Pt refusing to eat most of food all day, pocketing food, not swallowing. Put in order for ST and relayed all to attending MD. Per Dr. Kendrick Fries, place [facility] and start tube feedings.  Spoke w/ Dietician to relay need for tube feeding order.   1715 Still awaiting Panda, order has been placed, have spoken w/ SPD multiple times

## 2017-06-28 NOTE — Progress Notes (Signed)
Report given to receiving RN, 6N. Pt to be transported by bed

## 2017-06-28 NOTE — Progress Notes (Signed)
Nutrition Follow-up  DOCUMENTATION CODES:   Not applicable  INTERVENTION:  Ensure Enlive po BID, each supplement provides 350 kcal and 20 grams of protein (chcocolate) Boost Breeze po BID, each supplement provides 250 kcal and 9 grams of protein (orange)  NUTRITION DIAGNOSIS:   Increased nutrient needs related to wound healing, chronic illness as evidenced by estimated needs.  Ongoing  GOAL:   Patient will meet greater than or equal to 90% of their needs  Ongoing  MONITOR:   Vent status, Labs, Weight trends, Skin, TF tolerance, I & O's  REASON FOR ASSESSMENT:   Consult, Ventilator Enteral/tube feeding initiation and management  ASSESSMENT:   52 y.o. F admitted from SNF on 06/26/17 for DKA with altered mental status, hypotension, severe sepsis, chronic sacral wound, acute respiratory failure, acute metabolic encephalopathy, AKI. PMH of osteomyelitis and multiple sclerosis, anemia, HTN, hyperlipidemia, anxitety/depression with psychotic symptoms, sepsis due to UTI 03/2017, T1DM, stroke, arthritis, chronic back pain,  Neuropathy of hands and feet, and asthma.   Pt extubated 06/27/17.   Per RN pt not very vocal and would take three repetitions to answer questions. Pt took a long time to eat and may have swallowing difficulties. RN reports that pt likes boost breeze (orange) and Ensure Enlive (chocolate). Pt is sleepy and doesn't have much of an appetite. Pt needs assistance eating.   Per rounds plans to transfer pt to med-surg bed.    Medications reviewed: Pepcid, heparin, novolog 1-3 units, levemir 10 units, lactated ringers infusion, merrem.  Note: levemir and novolog held in am due to low CBGs - 40s.   Labs reviewed: BUN 22 (H), BG 72 (WNL), magnesium 1.5 (L), GFR 43 (L).    Intake/Output Summary (Last 24 hours) at 06/28/2017 1035 Last data filed at 06/28/2017 1000 Gross per 24 hour  Intake 3598 ml  Output 1930 ml  Net 1668 ml   Lab Results  Component Value Date   HGBA1C 10.3 (H) 04/19/2017   Diet Order:   Diet Order           Diet heart healthy/carb modified Room service appropriate? Yes; Fluid consistency: Thin  Diet effective now          EDUCATION NEEDS:   Not appropriate for education at this time  Skin:  Skin Assessment: Skin Integrity Issues: Skin Integrity Issues:: Unstageable, Stage I, Other (Comment) Stage I: L foot, R heel Unstageable: chronic sacral wound Other: moisture associated skin damage; perineum   Last BM:  06/28/17  Height:   Ht Readings from Last 1 Encounters:  06/26/17 5\' 5"  (1.651 m)    Weight:   Wt Readings from Last 1 Encounters:  06/28/17 133 lb 9.6 oz (60.6 kg)    Ideal Body Weight:  56.82 kg  BMI:  Body mass index is 22.23 kg/m.  Estimated Nutritional Needs:   Kcal:  1600-1800 kcal  Protein:  80-95 grams  Fluid:  1.6 - 1.8 L   Sherrine Maples, Dietetic Intern

## 2017-06-29 ENCOUNTER — Inpatient Hospital Stay (HOSPITAL_COMMUNITY): Payer: Medicaid Other

## 2017-06-29 DIAGNOSIS — B958 Unspecified staphylococcus as the cause of diseases classified elsewhere: Secondary | ICD-10-CM

## 2017-06-29 DIAGNOSIS — M4628 Osteomyelitis of vertebra, sacral and sacrococcygeal region: Secondary | ICD-10-CM

## 2017-06-29 DIAGNOSIS — I10 Essential (primary) hypertension: Secondary | ICD-10-CM

## 2017-06-29 DIAGNOSIS — L89154 Pressure ulcer of sacral region, stage 4: Secondary | ICD-10-CM

## 2017-06-29 DIAGNOSIS — E10649 Type 1 diabetes mellitus with hypoglycemia without coma: Secondary | ICD-10-CM

## 2017-06-29 DIAGNOSIS — R7881 Bacteremia: Secondary | ICD-10-CM

## 2017-06-29 DIAGNOSIS — K592 Neurogenic bowel, not elsewhere classified: Secondary | ICD-10-CM

## 2017-06-29 DIAGNOSIS — F339 Major depressive disorder, recurrent, unspecified: Secondary | ICD-10-CM

## 2017-06-29 DIAGNOSIS — R1310 Dysphagia, unspecified: Secondary | ICD-10-CM

## 2017-06-29 LAB — GLUCOSE, CAPILLARY
GLUCOSE-CAPILLARY: 164 mg/dL — AB (ref 65–99)
GLUCOSE-CAPILLARY: 198 mg/dL — AB (ref 65–99)
GLUCOSE-CAPILLARY: 92 mg/dL (ref 65–99)
Glucose-Capillary: 40 mg/dL — CL (ref 65–99)
Glucose-Capillary: 119 mg/dL — ABNORMAL HIGH (ref 65–99)
Glucose-Capillary: 49 mg/dL — ABNORMAL LOW (ref 65–99)
Glucose-Capillary: 86 mg/dL (ref 65–99)
Glucose-Capillary: 289 mg/dL — ABNORMAL HIGH (ref 65–99)

## 2017-06-29 LAB — CULTURE, RESPIRATORY W GRAM STAIN: Culture: NORMAL

## 2017-06-29 LAB — CULTURE, RESPIRATORY

## 2017-06-29 LAB — CK: CK TOTAL: 118 U/L (ref 38–234)

## 2017-06-29 MED ORDER — BOOST / RESOURCE BREEZE PO LIQD CUSTOM
1.0000 | Freq: Two times a day (BID) | ORAL | Status: DC
  Administered 2017-06-29: 237 mL via ORAL
  Administered 2017-06-29 – 2017-07-05 (×8): 1 via ORAL

## 2017-06-29 MED ORDER — OXYCODONE HCL 5 MG PO TABS
10.0000 mg | ORAL_TABLET | Freq: Four times a day (QID) | ORAL | Status: DC | PRN
  Administered 2017-06-29 – 2017-07-02 (×7): 10 mg via ORAL
  Filled 2017-06-29 (×7): qty 2

## 2017-06-29 MED ORDER — INSULIN ASPART 100 UNIT/ML ~~LOC~~ SOLN
0.0000 [IU] | Freq: Three times a day (TID) | SUBCUTANEOUS | Status: DC
  Administered 2017-06-29: 2 [IU] via SUBCUTANEOUS
  Administered 2017-06-30: 1 [IU] via SUBCUTANEOUS
  Administered 2017-06-30: 7 [IU] via SUBCUTANEOUS
  Administered 2017-07-01: 1 [IU] via SUBCUTANEOUS
  Administered 2017-07-03: 5 [IU] via SUBCUTANEOUS
  Administered 2017-07-03: 2 [IU] via SUBCUTANEOUS
  Administered 2017-07-03: 5 [IU] via SUBCUTANEOUS
  Administered 2017-07-04: 3 [IU] via SUBCUTANEOUS
  Administered 2017-07-04: 5 [IU] via SUBCUTANEOUS
  Administered 2017-07-04: 9 [IU] via SUBCUTANEOUS
  Administered 2017-07-05: 1 [IU] via SUBCUTANEOUS
  Administered 2017-07-05: 3 [IU] via SUBCUTANEOUS

## 2017-06-29 MED ORDER — INSULIN GLARGINE 100 UNIT/ML ~~LOC~~ SOLN
6.0000 [IU] | Freq: Every day | SUBCUTANEOUS | Status: DC
  Administered 2017-06-29 – 2017-07-01 (×3): 6 [IU] via SUBCUTANEOUS
  Filled 2017-06-29 (×3): qty 0.06

## 2017-06-29 MED ORDER — DEXTROSE 50 % IV SOLN
INTRAVENOUS | Status: AC
  Administered 2017-06-29: 50 mL
  Filled 2017-06-29: qty 50

## 2017-06-29 MED ORDER — SODIUM CHLORIDE 0.9 % IV SOLN
500.0000 mg | INTRAVENOUS | Status: DC
  Administered 2017-06-29 – 2017-07-05 (×7): 500 mg via INTRAVENOUS
  Filled 2017-06-29 (×7): qty 10

## 2017-06-29 MED ORDER — ENSURE ENLIVE PO LIQD
237.0000 mL | Freq: Two times a day (BID) | ORAL | Status: DC
  Administered 2017-06-29: 237 mL via ORAL

## 2017-06-29 MED ORDER — GADOBENATE DIMEGLUMINE 529 MG/ML IV SOLN
12.0000 mL | Freq: Once | INTRAVENOUS | Status: AC
  Administered 2017-06-29: 12 mL via INTRAVENOUS

## 2017-06-29 MED ORDER — DEXTROSE-NACL 5-0.45 % IV SOLN
INTRAVENOUS | Status: DC
  Administered 2017-06-29: 10:00:00 via INTRAVENOUS

## 2017-06-29 NOTE — Discharge Summary (Signed)
Name: Debra [name] MRN: [id number] DOB: 14-Jul-1965 52 y.o. PCP: Aldine Contes, MD  Date of Admission: 06/26/2017  8:02 AM Date of Discharge: 07/05/2017 Attending Physician: Oval Linsey, MD  Discharge Diagnosis: 1. Methicillin Resistant CoNS Bacteremia 2. Chronic Sacral Wound Infection 3. AKI, Neurogenic Bladder 4. Multiple Sclerosis 5. DKA, Hx of type 1 diabetes, Hypoglycemia 6. Depression 7. Dysphagia 8. Fatigue   Active Problems:   AKI (acute kidney injury) ([place])   Diabetic acidosis (HCC)   Uncontrolled type 1 diabetes mellitus with diabetic peripheral neuropathy (HCC)   Major depressive disorder, single episode, severe without psychosis ([place])   Decubitus ulcer of coccygeal region, stage IV ([place])   Severe sepsis ([place])   Staphylococcus epidermidis sepsis ([place])   PICC line infection, subsequent encounter   Major depressive disorder, recurrent episode, moderate ([place])   Adjustment disorder with mixed anxiety and depressed mood   MS (multiple sclerosis) ([place])   Palliative care by specialist   Advance care planning   Discharge Medications: Allergies as of 07/05/2017      Reactions   Penicillins Anaphylaxis, Nausea And Vomiting, Rash   Has patient had a PCN reaction causing immediate rash, facial/tongue/throat swelling, SOB or lightheadedness with hypotension: Yes Has patient had a PCN reaction causing severe rash involving mucus membranes or skin necrosis: Yes Has patient had a PCN reaction that required hospitalization No Has patient had a PCN reaction occurring within the last 10 years: Yes If all of the above answers are "NO", then may proceed with Cephalosporin use.   Lactose Intolerance (gi) Other (See Comments)   Pollen Extract Other (See Comments)   Seasonal Allergies   Tape Rash      Medication List    STOP taking these medications   meropenem IVPB Commonly known as:  MERREM   Oxycodone HCl 10 MG Tabs   risperiDONE 1 MG tablet Commonly known  as:  RISPERDAL   zolpidem 5 MG tablet Commonly known as:  AMBIEN     TAKE these medications   acetaminophen 325 MG tablet Commonly known as:  TYLENOL Take 650 mg by mouth every 4 (four) hours as needed for mild pain or fever.   Acidophilus 100 MG Caps Take 100 mg by mouth 2 (two) times daily.   amLODipine 10 MG tablet Commonly known as:  NORVASC Take 1 tablet (10 mg total) by mouth daily.   aspirin EC 81 MG tablet Take 1 tablet (81 mg total) by mouth daily.   atorvastatin 40 MG tablet Commonly known as:  LIPITOR Take 1 tablet (40 mg total) by mouth daily.   Baclofen 5 MG Tabs Take 5 mg by mouth 3 (three) times daily.   bethanechol 50 MG tablet Commonly known as:  URECHOLINE Take 1 tablet (50 mg total) by mouth 3 (three) times daily.   collagenase ointment Commonly known as:  SANTYL Apply topically 2 (two) times daily.   DAPTOmycin 500 mg in sodium chloride 0.9 % 100 mL Inject 500 mg into the vein daily for 7 days. Through 5/16.   doxycycline 50 MG capsule Commonly known as:  VIBRAMYCIN Take 2 capsules (100 mg total) by mouth 2 (two) times daily. Starting 5/17 Start taking on:  07/13/2017   DULoxetine 30 MG capsule Commonly known as:  CYMBALTA Take 1 capsule (30 mg total) by mouth daily.   feeding supplement (GLUCERNA SHAKE) Liqd Take 237 mLs by mouth 2 (two) times daily between meals.   feeding supplement (PRO-STAT SUGAR FREE 64) Liqd Take 30  mLs by mouth 3 (three) times daily between meals.   GLUCAGON HCL (RDNA) IJ Inject 1 mg into the muscle as needed (hypoglycemia-administer for CBG <60).   HYDROmorphone 2 MG tablet Commonly known as:  DILAUDID Take 0.5 tablets (1 mg total) by mouth every 4 (four) hours as needed for up to 5 days for severe pain.   insulin aspart 100 UNIT/ML injection Commonly known as:  novoLOG Inject 4-14 Units into the skin 3 (three) times daily before meals. 150-200=4 units 201-250=6 units 241-300=8 units 301-350=10  units 351-400=12 units 401-450=14 units notify PCP if >451   insulin glargine 100 UNIT/ML injection Commonly known as:  LANTUS Inject 0.05 mLs (5 Units total) into the skin at bedtime. What changed:    how much to take  when to take this   lidocaine 5 % Commonly known as:  LIDODERM Place 1 patch onto the skin daily. FOR LOWER BACK PAIN Remove & Discard patch within 12 hours or as directed by MD   lisinopril 20 MG tablet Commonly known as:  PRINIVIL,ZESTRIL Take 1 tablet (20 mg total) by mouth daily.   loperamide 2 MG capsule Commonly known as:  IMODIUM Take 2 capsules (4 mg total) by mouth as needed for diarrhea or loose stools.   magnesium hydroxide 400 MG/5ML suspension Commonly known as:  MILK OF MAGNESIA Take 30 mLs by mouth daily as needed for mild constipation.   meclizine 12.5 MG tablet Commonly known as:  ANTIVERT Take 12.5 mg by mouth 3 (three) times daily as needed for dizziness. per nsg. home MAR, pt. takes 1 tablet 1 hour prior to therapy.   meropenem 1 g in sodium chloride 0.9 % 100 mL Inject 1 g into the vein every 8 (eight) hours for 7 days. Through 5/16.   metoprolol succinate 100 MG 24 hr tablet Commonly known as:  TOPROL-XL Take 1 tablet (100 mg total) by mouth daily. Take with or immediately following a meal.   mirtazapine 45 MG tablet Commonly known as:  REMERON Take 1 tablet (45 mg total) by mouth at bedtime.   modafinil 100 MG tablet Commonly known as:  PROVIGIL Take 1 tablet (100 mg total) by mouth every morning.   multivitamin tablet Take 1 tablet by mouth daily.   ondansetron 4 MG tablet Commonly known as:  ZOFRAN Take 4 mg by mouth daily.   pantoprazole 40 MG tablet Commonly known as:  PROTONIX Take 1 tablet (40 mg total) by mouth daily.   polyethylene glycol packet Commonly known as:  MIRALAX / GLYCOLAX Take 17 g by mouth daily as needed for mild constipation, moderate constipation or severe constipation.   pregabalin 25 MG  capsule Commonly known as:  LYRICA Take 25 mg by mouth every 8 (eight) hours.   protein supplement shake Liqd Commonly known as:  PREMIER PROTEIN Take 325 mLs (11 oz total) by mouth 2 (two) times daily between meals.   tamsulosin 0.4 MG Caps capsule Commonly known as:  FLOMAX Take 1 capsule (0.4 mg total) daily by mouth.   Teriflunomide 14 MG Tabs Take 14 mg by mouth daily.   valACYclovir 500 MG tablet Commonly known as:  VALTREX Take 500 mg by mouth 2 (two) times daily.   Vitamin D (Ergocalciferol) 50000 units Caps capsule Commonly known as:  DRISDOL Take 1 capsule (50,000 Units total) by mouth every 7 (seven) days. What changed:  additional instructions       Disposition and follow-up:   [name] was discharged  from [facility] in [place] condition.  At the hospital follow up visit please address:  1.  Please ensure patient has outpatient Palliative Care follow up  2.  Poor PO intake - Thought to be secondary to depression. Please continue adjusted psych meds as needed. If still no improvement, another consideration would be gastroparesis from diabetes, so could consider work-up for this as an outpatient (would need to be off narcotics and anti-emetics prior to study). If patient prefers feeding tube and unable to maintain PO intake, could consider this as last resort  3.  Bacteremia & Wound Infection - Patient to receive IV dapto and mero through PICC line through May 16. On May 17, please start PO doxycycline BID. PICC line can be removed after completion of IV antibiotics. Please ensure f/u with ID clinic.  4.  Fatigue - Possibly related to MS and/or depression. Treated with modafinil, can uptitrate as needed. Trazodone d/c'ed due to increased daytime fatigue with start of dilaudid and home lyrica. Continue adjusting medications. Please continue PT/OT  5.  Labs / imaging needed at time of follow-up: None  6.  Pending labs/ test needing  follow-up: None  Follow-up Appointments: Follow-up Information    Aldine Contes, MD. Go on 07/11/2017.   Specialty:  Internal Medicine Why:  at 1:15pm Contact information: [place], [address] [place] [id number] [facility]. Schedule an appointment as soon as possible for a visit in 10 day(s).   Specialty:  Infectious Diseases Contact information: [place], [place] [id number] [place] Hospital Course by problem list: Active Problems:   AKI (acute kidney injury) ([place])   Diabetic acidosis ([place])   Uncontrolled type 1 diabetes mellitus with diabetic peripheral neuropathy (HCC)   Major depressive disorder, single episode, severe without psychosis ([place])   Decubitus ulcer of coccygeal region, stage IV ([place])   Severe sepsis ([place])   Staphylococcus epidermidis sepsis ([place])   PICC line infection, subsequent encounter   Major depressive disorder, recurrent episode, moderate ([place])   Adjustment disorder with mixed anxiety and depressed mood   MS (multiple sclerosis) ([place])   Palliative care by specialist   Advance care planning   1. Methicillin Resistant CoNS Bacteremia Sacral Wound Infection Patient has had a complex course in the last 6 months, requiring 7 admissions. 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 She was discharged to SNF from that admission on 5 weeks of Meropenem (last dose April 26).  She was re-admitted in this hospitalization on 4/30 to the ICU for sepsis thought to be from sacral osteomyelitis. She also had DKA (likely triggered by infection) that required intubation. She was seen by Surgery who felt no further debridement was necessary. She was extubated on 5/1 and transferred to our service. She was placed on daptomycin and meropenem per ID after 3/4 blood cultures  positive for methicillin resistant CoNS. Source for her bacteremia was felt to be from PICC line that had been present since March. The PICC line was removed for a line holiday. Repeat blood cultures on 5/2 were negative. PICC line was replaced on 5/7 for IV daptomycin and IV meropenem for 2 weeks total from negative BCx per ID recs (4/30 -> stop date 5/16). Patient will start PO doxycycline '100mg'$  BID for at least 3  months after completion of IV antibiotics (start date 5/17). She was discharged with PO dilaudid for pain. Trazodone was stopped due to increased fatigue after starting dilaudid. Please ensure follow up with ID clinic.  2. AKI, Neurogenic Bladder AKI improved with IVF and treatment of infection.  3. Primary Progressive Multiple Sclerosis Fatigue 2/2 MS Diagnosed with MS in 10/2016. At baseline, she has bilateral LE weakness leading to falls, neurogenic bladder requiring chronic indwelling foley, and fecal incontinence. She has had multiple hospitalizations for treatment of UTI and bacteremia/osteomyelitis. Foley last changed 5/1. She started rituximab infusions for maintenance in Nov 2018 and was intended to follow up with Neurology (Dr. Felecia Shelling) as an outpatient for Rituximab infusions in Dec 2018, however this was not completed. She was recently seen by Dr. Felecia Shelling on 4/17, at which time he recommended not continuing rituximab due to her active infection at the time (was being treated for sacral osteomyelitis). She was transitioned to teriflunomide. He also recommended considering modafinil or armodafinil if she continued to have lethargy. Modafinil was started, which did slightly improve her alertness and affect - could consider uptitration as outpatient.   While she was inpatient, she complained of 1 week of oropharyngeal dysphagia. After discussion with Neurology, MRI brain/c-spine for possible MS flare causing bulbar symptoms was obtained, which did not show an etiology for her dysphagia.  Teriflunomide was held in the setting of her infection, but was restarted on discharge. Continue f/u with Dr. Felecia Shelling. Palliative Medicine was also consulted for assistance with symptom management, plan for continued f/u with Palliative Medicine as outpatient.  4. DKA, Hx of type 1 diabetes, Hypoglycemia, Labile BG She was initially admitted in DKA and altered mental status, requiring intubation and ICU monitoring. DKA likely triggered by infection. She was started on an insulin drip with resolution of DKA. She began having episodes of hypoglycemia, similar to her previous admission, which was felt to be due to poor PO intake. Her insulin regimen was adjusted to Lantus 4 units QHS and SSI. Her diabetes has been difficult to control secondary to infection and poor PO intake.  5. Depression Patient's mood is depressed with flat affect and ?catatonia. Home remeron and risperidone were held due to dysphagia. Psychiatry was consulted who recommended discontinuing risperidone and starting duloxetine. Will need continued adjustment as outpatient.  6. Dysphagia, Poor PO intake Patient described 1 week of oropharyngeal dysphagia without abdominal pain, odynophagia, or N/V. Speech swallow eval was completed, which was negative for dysphagia. There was concern that her dysphagic symptoms could be related to bulbar involvement with her hx of MS, thus MRI brain and c-spine were obtained, which was negative for an etiology. Her dysphagia was thought to be secondary to depression, thus her psych medications were adjusted to better control this. She also described nausea prior to eating, which could suggest gastroparesis secondary to diabetes. Could consider outpatient work up if still no improvement in PO intake after treating depression. Also could consider placement of feeding tube for enteral nutrition if unable to maintain PO intake and if patient prefers this.  Discharge Vitals:   BP (!) 155/76 (BP Location: Left  Arm)   Pulse 72   Temp 97.7 F (36.5 C) (Oral)   Resp 16   Ht '5\' 5"'$  (1.651 m)   Wt 122 lb 9.2 oz (55.6 kg)   SpO2 100%   BMI 20.40 kg/m   Pertinent Labs, Studies, and Procedures:  CBC Latest Ref Rng & Units 07/01/2017 06/27/2017 06/26/2017  WBC 4.0 -  10.5 K/uL 10.4 12.7(H) -  Hemoglobin 12.0 - 15.0 g/dL 13.4 10.3(L) 5.8(LL)  Hematocrit 36.0 - 46.0 % 40.1 30.5(L) 17.0(L)  Platelets 150 - 400 K/uL 209 249 -   CMP Latest Ref Rng & Units 07/04/2017 07/01/2017 06/30/2017  Glucose 65 - 99 mg/dL 357(H) 93 472(H)  BUN 6 - 20 mg/dL '6 9 12  '$ Creatinine 0.44 - 1.00 mg/dL 0.85 0.79 1.05(H)  Sodium 135 - 145 mmol/L 136 137 133(L)  Potassium 3.5 - 5.1 mmol/L 3.5 3.6 4.9  Chloride 101 - 111 mmol/L 105 104 105  CO2 22 - 32 mmol/L 23 25 20(L)  Calcium 8.9 - 10.3 mg/dL 8.0(L) 8.6(L) 8.0(L)  Total Protein 6.5 - 8.1 g/dL - - -  Total Bilirubin 0.3 - 1.2 mg/dL - - -  Alkaline Phos 38 - 126 U/L - - -  AST 15 - 41 U/L - - -  ALT 14 - 54 U/L - - -   BCx 4/30 -> 3 out of 4 bottles with Methicillin resistant coagulase negative staph UA with glucose 250, large Hgb, small bili, 15 ketones, 100 protein, moderate leukocytes, rare bacteria ESR 90, CRP 16.5 Lactic acid 1.81 ABG 7.49/29.4/470/23 PCT 6.21 BCx 5/2 -> negative  CT head 06/26/2017 - Patchy periventricular small vessel disease. No acute infarct. No mass or hemorrhage. - Foci of arterial vascular calcification noted. - Multifocal paranasal sinus disease with air-fluid levels in each sphenoid sinus.  CT renal stone study 06/27/2017 1. No CT evidence for acute intra-abdominal or pelvic abnormality. 2. Thick-walled appearance of the urinary bladder with air-fluid level and Foley catheter. 3. Deep sacral decubitus ulcer with bony erosive changes involving the coccyx, consistent with osteomyelitis. Multiple small gas and fluid collections in the right ischial anal fossa, possible small abscesses. More posterior collection may be contiguous with a linear soft  tissue density that extends toward the right posterior gluteal skin surface and may represent a fistula.  MRI brain/cervical spine 06/29/2017 MRI HEAD IMPRESSION  1. Multiple T2/FLAIR hyperintense lesions involving the corpus callosum and supratentorial cerebral white matter, as well as the bilateral middle cerebellar peduncles, consistent with history of multiple sclerosis. Question faint diffusion abnormality enhancement about a lesion at the right splenium and right cerebellar peduncle, suspicious for mild active demyelination. No new lesions on today's exam. 2. Mild diffuse dural thickening and enhancement, of uncertain intervention/LP. Correlation with CSF values suggested as clinically warranted. Etiology or significance. Query recent  [place]  1. Patchy cord signal abnormality with lesions at C2, C6, and C7, overall relatively stable in appearance as compared to previous exam. No evidence for active demyelination. 2. Mild for age multilevel degenerative spondylolysis without significant stenosis.  Discharge Instructions: Discharge Instructions    Diet - low sodium heart healthy   Complete by:  As directed    Discharge instructions   Complete by:  As directed    [name],  While you were here, we treated you for a blood infection that was probably caused from the line you were getting your antibiotics from. You also have an infection from the wound on your bottom that we are continuing to treat. You are going to get long-term antibiotics for this infection. - You will get IV antibiotics for a total of 2 weeks. (through May 16). - After that, we can remove the PICC line. - You will then take doxycycline '100mg'$  twice a day (starting May 17) for at least 3 months. Follow up with infectious disease doctors will  be able to help determine how long to be on this medicine for. - Please call the Infectious Disease doctor's office to schedule an appointment to be seen in  about 10 days.  Please take modafinil every morning to help keep you awake during the day.  You have hydromorphone as needed every 4 hours for severe pain in your legs. You are also on Lyrica three times a day which can help with your leg pain.  We are working on increasing your nutrition by trying to treat your depression and nausea. Please take mirtazapine once daily for this and you can use zofran if needed for nausea. There are some tests that we might consider doing as an outpatient to figure out why you feel nauseous.  Please follow up with your primary doctor on Wednesday May 15 at 1:15pm   Increase activity slowly   Complete by:  As directed       Signed: Velna Ochs, MD 07/05/2017, 2:04 PM   Pager: Mamie Nick [phone]

## 2017-06-29 NOTE — Progress Notes (Signed)
Dr. Renaldo Reel notified of CBG 49. D50 given at this time. Will recheck CBG in 15 mins.. Will continue to monitor

## 2017-06-29 NOTE — Progress Notes (Signed)
Nutrition Follow-up  DOCUMENTATION CODES:   Not applicable  INTERVENTION:   -Continue Ensure Enlive po BID, each supplement provides 350 kcal and 20 grams of protein -Continue Boost Breeze po BID, each supplement provides 250 kcal and 9 grams of protein -Will monitor PO intake closely; pt may benefit from supplemental enteral nutrition support if poor PO intake persists  NUTRITION DIAGNOSIS:   Increased nutrient needs related to wound healing, chronic illness as evidenced by estimated needs.  Ongoing  GOAL:   Patient will meet greater than or equal to 90% of their needs  Progressing  MONITOR:   Vent status, Labs, Weight trends, Skin, TF tolerance, I & O's  REASON FOR ASSESSMENT:   Consult, Ventilator Enteral/tube feeding initiation and management  ASSESSMENT:   52 y.o. F admitted from SNF on 06/26/17 for DKA with altered mental status, hypotension, severe sepsis, chronic sacral wound, acute respiratory failure, acute metabolic encephalopathy, AKI. PMH of osteomyelitis and multiple sclerosis, anemia, HTN, hyperlipidemia, anxitety/depression with psychotic symptoms, sepsis due to UTI 03/2017, T1DM, stroke, arthritis, chronic back pain,  Neuropathy of hands and feet, and asthma.   5/1- extubated 5/2- transferred from ICU to surgical floor 5/3- s/p BSE- advanced to full liquid diet  Case discussed with RN, who reports pt has not eating anything yet today, as she was just seen by SLP. She confirmed order for full liquid diet and plans to hold off on NGT placement. Per previous RD caring for pt, eating is a very laborious task for pt (pt takes up to an hour to consume very small amounts of meals); this information was passed on to RN. She does take supplements well.   Per RN, plan to obtain MRI of head because pt is refusing to raise her head.   Labs reviewed: CBGS: 49-119 (inpatient orders for 0-9 units insulin aspart TID with meals and 6 units insulin glargine q HS).   Diet  Order:   Diet Order           Diet full liquid Room service appropriate? Yes; Fluid consistency: Thin  Diet effective now          EDUCATION NEEDS:   Not appropriate for education at this time  Skin:  Skin Assessment: Skin Integrity Issues: Skin Integrity Issues:: Unstageable, Stage I, Other (Comment) Stage I: L foot, R heel Unstageable: chronic sacral wound Other: moisture associated skin damage; perineum   Last BM:  06/28/17  Height:   Ht Readings from Last 1 Encounters:  06/26/17 5\' 5"  (1.651 m)    Weight:   Wt Readings from Last 1 Encounters:  06/28/17 133 lb 9.6 oz (60.6 kg)    Ideal Body Weight:  56.82 kg  BMI:  Body mass index is 22.23 kg/m.  Estimated Nutritional Needs:   Kcal:  1600-1800 kcal  Protein:  80-95 grams  Fluid:  1.6 - 1.8 L    Kameela Leipold A. Mayford Knife, RD, LDN, CDE Pager: [phone] After hours Pager: [phone]

## 2017-06-29 NOTE — Progress Notes (Signed)
INFECTIOUS DISEASE PROGRESS NOTE  ID: Debra [name] is a 52 y.o. female with  Active Problems:   AKI (acute kidney injury) (HCC)   Diabetic acidosis (HCC)   Uncontrolled type 1 diabetes mellitus with diabetic peripheral neuropathy (HCC)   Major depressive disorder, single episode, severe without psychosis (HCC)   Decubitus ulcer of coccygeal region, stage IV (HCC)   Severe sepsis (HCC)   Staphylococcus epidermidis sepsis (HCC)   PICC line infection, initial encounter  Subjective: Out of unit Afebrile.   Abtx:  Anti-infectives (From admission, onward)   Start     Dose/Rate Route Frequency Ordered Stop   06/29/17 1200  DAPTOmycin (CUBICIN) 500 mg in sodium chloride 0.9 % IVPB     500 mg 220 mL/hr over 30 Minutes Intravenous Every 24 hours 06/29/17 0933     06/28/17 1500  meropenem (MERREM) 1 g in sodium chloride 0.9 % 100 mL IVPB     1 g 200 mL/hr over 30 Minutes Intravenous Every 8 hours 06/28/17 1121     06/28/17 1200  DAPTOmycin (CUBICIN) 457.5 mg in sodium chloride 0.9 % IVPB  Status:  Discontinued     8 mg/kg  57.2 kg 218.3 mL/hr over 30 Minutes Intravenous Every 24 hours 06/28/17 1121 06/29/17 0933   06/26/17 2000  meropenem (MERREM) 500 mg in sodium chloride 0.9 % 100 mL IVPB  Status:  Discontinued     500 mg 200 mL/hr over 30 Minutes Intravenous Every 12 hours 06/26/17 1408 06/28/17 1121   06/26/17 1500  DAPTOmycin (CUBICIN) 457.5 mg in sodium chloride 0.9 % IVPB  Status:  Discontinued     8 mg/kg  57.2 kg 218.3 mL/hr over 30 Minutes Intravenous Every 48 hours 06/26/17 1408 06/28/17 1121   06/26/17 0845  meropenem (MERREM) 2 g in sodium chloride 0.9 % 100 mL IVPB  Status:  Discontinued     2 g 200 mL/hr over 30 Minutes Intravenous Every 8 hours 06/26/17 0843 06/26/17 1408      Medications:  Scheduled: . bethanechol  50 mg Oral TID  . chlorhexidine  15 mL Mouth Rinse BID  . collagenase   Topical Daily  . heparin  5,000 Units Subcutaneous Q8H  . insulin  aspart  0-9 Units Subcutaneous TID WC  . insulin glargine  6 Units Subcutaneous QHS  . mouth rinse  15 mL Mouth Rinse q12n4p  . tamsulosin  0.4 mg Oral Daily    Objective: Vital signs in last 24 hours: Temp:  [97.4 F (36.3 C)-98.9 F (37.2 C)] 98.9 F (37.2 C) (05/03 1317) Pulse Rate:  [75-91] 84 (05/03 1317) Resp:  [11-16] 16 (05/03 1317) BP: (124-148)/(69-81) 148/81 (05/03 1317) SpO2:  [95 %-100 %] 100 % (05/03 1317)   General appearance: alert and no distress Resp: clear to auscultation bilaterally Cardio: regular rate and rhythm GI: normal findings: bowel sounds normal and soft, non-tender Extremities: PIC out of RUE  Flat affect  Lab Results Recent Labs    06/26/17 1704 06/26/17 1723  06/27/17 0437 06/28/17 0523  WBC 7.7  --   --  12.7*  --   HGB 5.9* 5.8*  --  10.3*  --   HCT 17.7* 17.0*  --  30.5*  --   NA 129* 131*   < > 133* 139  K 3.7 3.8   < > 4.0 3.7  CL 99* 96*   < > 102 108  CO2 22  --    < > 24 25  BUN  23* 22*   < > 22* 22*  CREATININE 2.57* 2.30*   < > 2.12* 1.39*   < > = values in this interval not displayed.   Liver Panel No results for input(s): PROT, ALBUMIN, AST, ALT, ALKPHOS, BILITOT, BILIDIR, IBILI in the last 72 hours. Sedimentation Rate No results for input(s): ESRSEDRATE in the last 72 hours. C-Reactive Protein No results for input(s): CRP in the last 72 hours.  Microbiology: Recent Results (from the past 240 hour(s))  Blood Culture (routine x 2)     Status: Abnormal (Preliminary result)   Collection Time: 06/26/17  8:08 AM  Result Value Ref Range Status   Specimen Description BLOOD RIGHT ANTECUBITAL  Final   Special Requests   Final    BOTTLES DRAWN AEROBIC AND ANAEROBIC Blood Culture adequate volume   Culture  Setup Time   Final    IN BOTH AEROBIC AND ANAEROBIC BOTTLES GRAM POSITIVE COCCI CRITICAL RESULT CALLED TO, READ BACK BY AND VERIFIED WITH: V BRYK PHARMD 06/27/17 0606 JDW    Culture (A)  Final    STAPHYLOCOCCUS SPECIES  (COAGULASE NEGATIVE) THE SIGNIFICANCE OF ISOLATING THIS ORGANISM FROM A SINGLE SET OF BLOOD CULTURES WHEN MULTIPLE SETS ARE DRAWN IS UNCERTAIN. PLEASE NOTIFY THE MICROBIOLOGY DEPARTMENT WITHIN ONE WEEK IF SPECIATION AND SENSITIVITIES ARE REQUIRED. Performed at [facility], [address], [place], [place] [address]    Report Status PENDING  Incomplete  Blood Culture ID Panel (Reflexed)     Status: Abnormal   Collection Time: 06/26/17  8:08 AM  Result Value Ref Range Status   Enterococcus species NOT DETECTED NOT DETECTED Final   Listeria monocytogenes NOT DETECTED NOT DETECTED Final   Staphylococcus species DETECTED (A) NOT DETECTED Final    Comment: Methicillin (oxacillin) resistant coagulase negative staphylococcus. Possible blood culture contaminant (unless isolated from more than one blood culture draw or clinical case suggests pathogenicity). No antibiotic treatment is indicated for blood  culture contaminants. CRITICAL RESULT CALLED TO, READ BACK BY AND VERIFIED WITH: V BRYK PHARMD 06/27/17 0606 JDW    Staphylococcus aureus NOT DETECTED NOT DETECTED Final   Methicillin resistance DETECTED (A) NOT DETECTED Final    Comment: CRITICAL RESULT CALLED TO, READ BACK BY AND VERIFIED WITH: V BRYK PHARMD 06/27/17 0606 JDW    Streptococcus species NOT DETECTED NOT DETECTED Final   Streptococcus agalactiae NOT DETECTED NOT DETECTED Final   Streptococcus pneumoniae NOT DETECTED NOT DETECTED Final   Streptococcus pyogenes NOT DETECTED NOT DETECTED Final   Acinetobacter baumannii NOT DETECTED NOT DETECTED Final   Enterobacteriaceae species NOT DETECTED NOT DETECTED Final   Enterobacter cloacae complex NOT DETECTED NOT DETECTED Final   Escherichia coli NOT DETECTED NOT DETECTED Final   Klebsiella oxytoca NOT DETECTED NOT DETECTED Final   Klebsiella pneumoniae NOT DETECTED NOT DETECTED Final   Proteus species NOT DETECTED NOT DETECTED Final   Serratia marcescens NOT DETECTED NOT DETECTED  Final   Haemophilus influenzae NOT DETECTED NOT DETECTED Final   Neisseria meningitidis NOT DETECTED NOT DETECTED Final   Pseudomonas aeruginosa NOT DETECTED NOT DETECTED Final   Candida albicans NOT DETECTED NOT DETECTED Final   Candida glabrata NOT DETECTED NOT DETECTED Final   Candida krusei NOT DETECTED NOT DETECTED Final   Candida parapsilosis NOT DETECTED NOT DETECTED Final   Candida tropicalis NOT DETECTED NOT DETECTED Final    Comment: Performed at [facility], [address] [place], [place] [address]  Blood Culture (routine x 2)  Status: None (Preliminary result)   Collection Time: 06/26/17  9:40 AM  Result Value Ref Range Status   Specimen Description BLOOD LEFT ANTECUBITAL  Final   Special Requests   Final    BOTTLES DRAWN AEROBIC AND ANAEROBIC Blood Culture adequate volume   Culture  Setup Time   Final    AEROBIC BOTTLE ONLY GRAM POSITIVE COCCI CRITICAL VALUE NOTED.  VALUE IS CONSISTENT WITH PREVIOUSLY REPORTED AND CALLED VALUE. Performed at [facility], [address], [place], [place] [address]    Culture GRAM POSITIVE COCCI  Final   Report Status PENDING  Incomplete  Urine culture     Status: None   Collection Time: 06/26/17 10:30 AM  Result Value Ref Range Status   Specimen Description URINE, CATHETERIZED  Final   Special Requests NONE  Final   Culture   Final    NO GROWTH Performed at [facility], [address] [place], [place] [address]    Report Status 06/27/2017 FINAL  Final  MRSA PCR Screening     Status: None   Collection Time: 06/27/17  1:56 AM  Result Value Ref Range Status   MRSA by PCR NEGATIVE NEGATIVE Final    Comment:        The GeneXpert MRSA Assay (FDA approved for NASAL specimens only), is one component of a comprehensive MRSA colonization surveillance program. It is not intended to diagnose MRSA infection nor to guide or monitor treatment for MRSA infections. Performed at [facility], [address], [place], [place] [address]   Culture, respiratory (NON-Expectorated)     Status: None   Collection Time: 06/27/17  4:05 AM  Result Value Ref Range Status   Specimen Description TRACHEAL ASPIRATE  Final   Special Requests NONE  Final   Gram Stain   Final    MODERATE WBC PRESENT, PREDOMINANTLY PMN FEW YEAST    Culture   Final    FEW Consistent with normal respiratory flora. Performed at [facility], [address], [place], [place] [address]    Report Status 06/29/2017 FINAL  Final  Culture, blood (Routine X 2) w Reflex to ID Panel     Status: None (Preliminary result)   Collection Time: 06/28/17 12:42 PM  Result Value Ref Range Status   Specimen Description   Final    BLOOD RIGHT ANTECUBITAL Performed at [facility], [address] [place], [place] [address]    Special Requests   Final    BOTTLES DRAWN AEROBIC ONLY Blood Culture results may not be optimal due to an excessive volume of blood received in culture bottles   Culture PENDING  Incomplete   Report Status PENDING  Incomplete    Studies/Results: Dg Chest Port 1 View  Result Date: 06/28/2017 CLINICAL DATA:  52 year old female admitted with severe sepsis, pyuria, sacral wound, osteomyelitis. EXAM: PORTABLE CHEST 1 VIEW COMPARISON:  06/27/2017 and earlier. FINDINGS: Portable AP semi upright view at 0354 hours. Extubated and enteric tube removed. Mildly lower lung volumes, mild left lung base retrocardiac opacity. Stable right upper extremity approach PICC line. Stable cardiac size and mediastinal contours. No pneumothorax, pulmonary edema, pleural effusion or other confluent opacity. No acute osseous abnormality identified. IMPRESSION: 1. Extubated and enteric tube removed. 2. Mildly lower lung volumes with mild left lung base atelectasis. No other acute cardiopulmonary abnormality. Electronically Signed   By: Odessa Fleming M.D.   On: 06/28/2017 07:25     Assessment/Plan: MRSE  bacteremia  Suspected PIC  related Decubitus ulcer, abscess MS  Total days of antibiotics: 3 dapto/merrem  No change in anbx Suspect she will need prolonged course with her undrained abscess Can place PIC when BCx (-) > 72h Need to follow her CK while on dapto. (118) Spoke with mother at length about her SNF. She would like CSW to speak with her about arranging a different placement.  ID available as needed over w/e.          Johny Sax MD, FACP Infectious Diseases (pager) [phone] [email or web address] 06/29/2017, 1:51 PM  LOS: 3 days

## 2017-06-29 NOTE — Clinical Social Work Note (Signed)
Clinical Social Work Assessment  Patient Details  Name: Debra [name] MRN: [id number] Date of Birth: 24-Jun-1965  Date of referral:  06/29/17               Reason for consult:  Facility Placement, Discharge Planning, Family Concerns                Permission sought to share information with:  Facility Sport and exercise psychologist, Family Supports Permission granted to share information::  No(pt very lethargic)  Name::     Jason Coop  Agency::  [facility]  Relationship::  aunt  Contact Information:  [phone]  Housing/Transportation Living arrangements for the past 2 months:  [place] of Information:  Other (Comment Required)(aunt) Patient Interpreter Needed:  None Criminal Activity/Legal Involvement Pertinent to Current Situation/Hospitalization:  No - Comment as needed Significant Relationships:  Other Family Members Lives with:  Facility Resident Do you feel safe going back to the place where you live?  No Need for family participation in patient care:  Yes (Comment)  Care giving concerns:  Pt aunt states that she feels as though pt health is not monitored at SNF, that pt has chronic UTIs and that pt developed bedsore due to being at a facility.   Social Worker assessment / plan:  CSW spoke with pt aunt Caren Griffins via telephone, pt aunt stated that she had a few questions. Questions are as follows- 1. Pt aunt is unclear about what Medicaid coverage pt has, asked if "pt gets a check" from this coverage. CSW states that she does not believe this is part of Medicaid coverage, but will f/u with financial counseling who is who submitted pt Medicaid application during previous admission.   2. Pt aunt concerned with pt chronically coming to the hospital, feels like she has infections at the catheter site from the facility neglecting to keep things clean. Pt aunt states that last time pt came to hospital pt had a "blood clot that spurted blood everywhere when  they removed the catheter." Pt aunt states she has met with SNF several times.   CSW acknowledged concerns and will reach out (with pt aunt permission) to SNF to discuss pt family concerns. CSW explained the difficulty in changing LTC placement for pts due to facility needing to offer placement to pt and having limited LTC beds at [facility]. Pt aunt states understanding. States that pt children do not come to visit or take active role in pt care.   CSW will continue to follow to support referral to facilities LTC, and if no other disposition can be arranged, back to current LTC facility.   Employment status:  Disabled (Comment on whether or not currently receiving Disability) Insurance information:  Medicaid [place] PT Recommendations:  24 Hour Supervision, [place] / Referral to community resources:  [place]  Patient/Family's Response to care:  Pt aunt states understanding of CSW role, states recognition for limitations of CSW ability to change pt placement, wants updates whenever possible.   Patient/Family's Understanding of and Emotional Response to Diagnosis, Current Treatment, and Prognosis: Pt aunt states understanding of pt diagnosis, current treatment, and prognosis. Seems to be a strong advocate for pt, but does express frustration and disappointment in frequent hospitalizations.   Emotional Assessment Appearance:  Appears stated age Attitude/Demeanor/Rapport:  Lethargic, Unresponsive Affect (typically observed):  Depressed, Withdrawn Orientation:  Oriented to Self, Oriented to  Time, Oriented to Place Alcohol / Substance use:  Not Applicable Psych involvement (Current  and /or in the community):  No (Comment)  Discharge Needs  Concerns to be addressed:  Discharge Planning Concerns, Care Coordination Readmission within the last 30 days:  Yes Current discharge risk:  Physical Impairment, Chronically ill, Dependent with Mobility, Psychiatric  Illness Barriers to Discharge:  Continued Medical Work up   [employer], [place] 06/29/2017, 6:19 PM

## 2017-06-29 NOTE — Evaluation (Signed)
Clinical/Bedside Swallow Evaluation Patient Details  Name: Debra [name] MRN: [id number] Date of Birth: 06-09-1965  Today's Date: 06/29/2017 Time: SLP Start Time (ACUTE ONLY): 1239 SLP Stop Time (ACUTE ONLY): 1303 SLP Time Calculation (min) (ACUTE ONLY): 24 min  Past Medical History:  Past Medical History:  Diagnosis Date  . Adenomatous colonic polyps   . Anemia    2005  . Anxiety    1990  . Arthritis    "knees, hands" (05/07/2017)  . Asthma    2000  . Benign essential HTN   . Cataract   . Chronic lower back pain   . Depression with psychotic symptoms   . Diabetic ketoacidosis without coma associated with type 1 diabetes mellitus (HCC)   . Difficult intubation    narrow airway  . Fibromyalgia 11/07/2016  . Gastroesophageal Reflux Disease (GERD)   . Heart murmur    Birth  . Hyperlipidemia    2005  . Hypertension    1998  . Internal hemorrhoids 04/24/06   on colonoscopy  . Migraine    "used to have them all the time; now maybe q6 months" (05/07/2017)  . Multiple sclerosis (HCC)   . Neuropathic bladder   . Neuropathy of the hands & feet   . Restless Leg Syndrome   . Sepsis due to urinary tract infection (HCC) 04/12/2017  . Sleep paralysis   . Stable angina pectoris    2007: cath showing normal cors.   . Stroke 1990   denies residual on 05/07/2017  . Type I Diabetes Mellitus 1988   Past Surgical History:  Past Surgical History:  Procedure Laterality Date  . BREAST SURGERY Left    biopsy left breast  . CARDIAC CATHETERIZATION  ~ 2007/2008  . COLONOSCOPY W/ BIOPSIES AND POLYPECTOMY    . DEBRIDMENT OF DECUBITUS ULCER N/A 05/21/2017   Procedure: DEBRIDMENT OF SACRAL DECUBITUS ULCER;  Surgeon: Claud Kelp, MD;  Location: [facility] OR;  Service: General;  Laterality: N/A;  . DILATION AND CURETTAGE OF UTERUS  12/29/2010   Surgeon: Tereso Newcomer, MD;  Location: [facility] ORS;  Service: Gynecology  . ENDOMETRIAL ABLATION W/ NOVASURE N/A 12/2009  . EYE SURGERY Bilateral    "lots; laser tto correct broken blood vessels from diabetes"  . FOOT SURGERY Bilateral    "bones fused; bones removed"  . FRACTURE SURGERY    . HYSTEROSCOPY  12/29/2010   Procedure: HYSTEROSCOPY WITH HYDROTHERMAL ABLATION;  Surgeon: Tereso Newcomer, MD;  Location: [facility] ORS;  Service: Gynecology;;  . IUD REMOVAL  12/29/2010   Procedure: INTRAUTERINE DEVICE (IUD) REMOVAL;  Surgeon: Tereso Newcomer, MD;  Location: [facility] ORS;  Service: Gynecology;  Laterality: N/A;  . LAPAROSCOPIC CHOLECYSTECTOMY    . LAPAROSCOPIC TUBAL LIGATION  12/29/2010   Procedure: LAPAROSCOPIC TUBAL LIGATION;  Surgeon: Tereso Newcomer, MD;  Location: [facility] ORS;  Service: Gynecology;  Laterality: Bilateral;  . ORIF ANKLE FRACTURE Right 10/09/2013   Procedure: Open reduction internal fixation right ankle;  Surgeon: Mable Paris, MD;  Location: [facility] OR;  Service: Orthopedics;  Laterality: Right;  Open reduction internal fixation right ankle  . TRIGGER FINGER RELEASE     x 3  . TUBAL LIGATION     HPI:  52 y/o female, resident of SNF, was brought to the ER with sepsis.  She has baseline multiple sclerosis, neurogenic bladder, frequent UTI's.  She has a large sacral wound and recent history of osteomyelitis.  ETT 4/30-5/01.  PO diet initiated -  per notes 5/2,  pt refusing to eat, pocketing, not swallowing, hence SLP swallow eval ordered.  Pt was seen by SLP services in February of this year, ultimately cleared for a regular diet with thin liquids.     Assessment / Plan / Recommendation Clinical Impression   Pt required much encouragement to participate in swallow assessment due to pain in legs when elevating HOB.  Eventually, with help from her aunt, she was amenable to raising HOB minimally.  Pt demonstrated adequate mastication of pureed solids, appearance of brisk swallow response, and no s/s of aspiration.  Passed three oz water test without difficulty.  Voice was low in volume, but clear quality. There was no pocketing or oral  holding noted today.  Regular solids were not administered given positioning limitations. For now, recommend starting a full liquid diet - D/W pt, who agrees with plan.  SLP will follow briefly for safety/diet progression. SLP Visit Diagnosis: Dysphagia, unspecified (R13.10)    Aspiration Risk       Diet Recommendation   full liquids after discussion  Medication Administration: Whole meds with liquid    Other  Recommendations Oral Care Recommendations: Oral care BID   Follow up Recommendations None      Frequency and Duration min 1 x/week  1 week       Prognosis Prognosis for Safe Diet Advancement: Good      Swallow Study   General Date of Onset: 06/26/17 HPI: 52 y/o female, resident of SNF, was brought to the ER with sepsis.  She has baseline multiple sclerosis, neurogenic bladder, frequent UTI's.  She has a large sacral wound and recent history of osteomyelitis.  ETT 4/30-5/01.  PO diet initiated -  per notes 5/2, pt refusing to eat, pocketing, not swallowing, hence SLP swallow eval ordered.  Pt was seen by SLP services in February of this year, ultimately cleared for a regular diet with thin liquids.   Type of Study: Bedside Swallow Evaluation Previous Swallow Assessment: see HPI Diet Prior to this Study: NPO Temperature Spikes Noted: No Respiratory Status: Room air History of Recent Intubation: Yes Length of Intubations (days): 1 days Date extubated: 06/27/17 Behavior/Cognition: Alert Oral Cavity Assessment: Within Functional Limits Oral Care Completed by SLP: No Oral Cavity - Dentition: Adequate natural dentition Vision: Functional for self-feeding Self-Feeding Abilities: Able to feed self Patient Positioning: Partially reclined Baseline Vocal Quality: Low vocal intensity Volitional Cough: Strong Volitional Swallow: Able to elicit    Oral/Motor/Sensory Function Overall Oral Motor/Sensory Function: Within functional limits   Ice Chips Ice chips: Within functional  limits   Thin Liquid Thin Liquid: Within functional limits    Nectar Thick Nectar Thick Liquid: Not tested   Honey Thick Honey Thick Liquid: Not tested   Puree Puree: Within functional limits   Solid   GO   Solid: Not tested        Blenda Mounts [place] 06/29/2017,1:59 PM

## 2017-06-29 NOTE — Progress Notes (Signed)
ICU Transfer Summary: This is a 52 y.o. woman with a complicated past medical history of late that includes multiple sclerosis resulting in urinary and bowel dysfunction, loss of function in her lower extremities, chronic indwelling Foley, and type 1 DM.  She also has a recent admission in March 2018 for VRE and Proteus bacteremia as well as sacral osteomyelitis.  At that time she was discharged on 5 weeks of Meropenem with last dose being April 26.  She presented to the hospital on April 30 with sepsis thought to be from her sacral osteomyelitis and DKA that required intubation and ICU admit.  She was seen by surgery who felt no further debridement was indicated.  She was extubated on 5/1 and subsequently transferred to the floor with IMTS being asked to assume care.  She has been placed on Daptomycin and Meropenem per ID after 3 of 4 blood cultures were positive for methicillin resistant CoNS.  Nursing notes from yesterday indicate patient has been declining to eat most of her food and not swallowing.  Order was placed for speech therapy evaluation and to place [facility] and begin tube feedings.  This has not yet been completed.    Subjective: Reports no complaints this morning and denies any pain currently.  She reports having difficulty swallowing lately but otherwise normally feeds herself.  Objective:  Vital signs in last 24 hours: Vitals:   06/28/17 1730 06/28/17 1800 06/28/17 2236 06/29/17 0427  BP:  137/74 (!) 143/74 (!) 147/81  Pulse: 82 80 80 80  Resp: 12 11    Temp:   97.8 F (36.6 C) 98.3 F (36.8 C)  TempSrc:   Oral Oral  SpO2: 100% 100% 99% 95%  Weight:      Height:       General: resting in bed, appears in no distress, keeps her eyes closed  HEENT: NCAT, EOMI, no scleral icterus, tongue midline Cardiac: RRR, 2/6 systolic murmur Pulm: clear to auscultation bilaterally, moving normal volumes of air Abd: soft, nontender, nondistended, BS present Ext: heel booties on bilateral  legs with trace LE edema.  Able to raise both arms off bed with weak grip strength Neuro: alert and oriented X3, cranial nerves II-XII grossly intact  Psych: depressed and flattened affect.  Provides mostly short "yes" or "no" answers to questions. Skin: Her sacral wound was not assessed this morning (See photos under media tab from 5/1)  Assessment/Plan:  # Methicillin Resistant CoNS Bacteremia: source felt to be from a PICC line present since March that has now been removed.  Repeat blood cultures from 5/2 are pending. ## Sacral Wound Infection - Daptomycin and Meropenem per ID - Follow repeat blood cultures - If cultures remain negative, may be able to get new PICC in a couple days - Wound care recommendations appreciated - Per surgery, no further debridement at this time  # Hypertension: home medications include amlodipine, lisinopril, and metoprolol which have all been held since admission.  BP this morning is in the 140s systolic. - Continue monitoring her BP today off medications  # AKI ## Neurogenic Bladder Acute kidney injury has improved from admission - Continue Bethanecol and Flomax - BMET and magnesium in the morning - I/O monitoring  # Diabetes ## Hypoglycemia ### Poor oral intake Blood sugars have been sporadic with intermittent lower readings and most recent this morning of 49 treated with an amp of D50 with improvement to 119.  Her blood sugars have been difficult to control from prior admissions  as well in the setting of poor intake.  Home medications are Lantus 6 units and Novolog sliding scale. Currently NPO awaiting SLP evaluation and tube feedings.  She is currently on Levemir 10 units BID and ICU protocol sliding scale insulin Q4H - Will stop ICU sliding scale and Levemir - Begin Lantus 6 units QHS and sensitive sliding scale TID - Await SLP evaluation before initiating tube feeds - Stop LR infusion and begin D5-1/2NS at 50cc/hr until taking PO - Feeding  supplements when able to take PO  # Multiple Sclerosis She is on teriflunomide at home.   # Depression: her mood is depressed this morning with flattened affect.  She is on Remeron QHS as well as Risperidone QHS.  These have been held since admission - Follow up SLP evaluation to determine resuming her antidepressants versus escalating therapy   Dispo: Anticipated discharge in approximately 2-4 day(s).   Gwynn Burly, DO 06/29/2017, 7:09 AM Pager: [phone]

## 2017-06-30 DIAGNOSIS — I129 Hypertensive chronic kidney disease with stage 1 through stage 4 chronic kidney disease, or unspecified chronic kidney disease: Secondary | ICD-10-CM

## 2017-06-30 DIAGNOSIS — L89159 Pressure ulcer of sacral region, unspecified stage: Secondary | ICD-10-CM

## 2017-06-30 DIAGNOSIS — R1312 Dysphagia, oropharyngeal phase: Secondary | ICD-10-CM

## 2017-06-30 DIAGNOSIS — N183 Chronic kidney disease, stage 3 (moderate): Secondary | ICD-10-CM

## 2017-06-30 DIAGNOSIS — F333 Major depressive disorder, recurrent, severe with psychotic symptoms: Secondary | ICD-10-CM

## 2017-06-30 DIAGNOSIS — Z79899 Other long term (current) drug therapy: Secondary | ICD-10-CM

## 2017-06-30 DIAGNOSIS — E10622 Type 1 diabetes mellitus with other skin ulcer: Secondary | ICD-10-CM

## 2017-06-30 DIAGNOSIS — B9562 Methicillin resistant Staphylococcus aureus infection as the cause of diseases classified elsewhere: Secondary | ICD-10-CM

## 2017-06-30 DIAGNOSIS — R159 Full incontinence of feces: Secondary | ICD-10-CM

## 2017-06-30 DIAGNOSIS — E1022 Type 1 diabetes mellitus with diabetic chronic kidney disease: Secondary | ICD-10-CM

## 2017-06-30 LAB — BPAM RBC
BLOOD PRODUCT EXPIRATION DATE: 201905242359
BLOOD PRODUCT EXPIRATION DATE: 201905252359
Blood Product Expiration Date: 201905242359
Blood Product Expiration Date: 201905252359
ISSUE DATE / TIME: 201904301742
ISSUE DATE / TIME: 201904301742
UNIT TYPE AND RH: 5100
Unit Type and Rh: 5100
Unit Type and Rh: 5100
Unit Type and Rh: 5100

## 2017-06-30 LAB — TYPE AND SCREEN
ABO/RH(D): O POS
Antibody Screen: NEGATIVE
UNIT DIVISION: 0
UNIT DIVISION: 0
Unit division: 0
Unit division: 0

## 2017-06-30 LAB — BASIC METABOLIC PANEL
Anion gap: 8 (ref 5–15)
BUN: 12 mg/dL (ref 6–20)
CALCIUM: 8 mg/dL — AB (ref 8.9–10.3)
CO2: 20 mmol/L — ABNORMAL LOW (ref 22–32)
CREATININE: 1.05 mg/dL — AB (ref 0.44–1.00)
Chloride: 105 mmol/L (ref 101–111)
GFR calc Af Amer: >60 mL/min (ref >60)
GLUCOSE: 472 mg/dL — AB (ref 65–99)
POTASSIUM: 4.9 mmol/L (ref 3.5–5.1)
SODIUM: 133 mmol/L — AB (ref 135–145)

## 2017-06-30 LAB — GLUCOSE, CAPILLARY
GLUCOSE-CAPILLARY: 304 mg/dL — AB (ref 65–99)
GLUCOSE-CAPILLARY: 157 mg/dL — AB (ref 65–99)
Glucose-Capillary: 428 mg/dL — ABNORMAL HIGH (ref 65–99)
Glucose-Capillary: 432 mg/dL — ABNORMAL HIGH (ref 65–99)
Glucose-Capillary: 140 mg/dL — ABNORMAL HIGH (ref 65–99)

## 2017-06-30 LAB — MAGNESIUM: Magnesium: 1.8 mg/dL (ref 1.7–2.4)

## 2017-06-30 LAB — CULTURE, BLOOD (ROUTINE X 2)
SPECIAL REQUESTS: ADEQUATE
Special Requests: ADEQUATE

## 2017-06-30 MED ORDER — MODAFINIL 100 MG PO TABS
100.0000 mg | ORAL_TABLET | Freq: Every day | ORAL | Status: DC
  Administered 2017-06-30 – 2017-07-05 (×6): 100 mg via ORAL
  Filled 2017-06-30 (×7): qty 1

## 2017-06-30 MED ORDER — MIRTAZAPINE 45 MG PO TABS
45.0000 mg | ORAL_TABLET | Freq: Every day | ORAL | Status: DC
  Administered 2017-06-30 – 2017-07-04 (×5): 45 mg via ORAL
  Filled 2017-06-30 (×6): qty 1

## 2017-06-30 MED ORDER — INSULIN ASPART 100 UNIT/ML ~~LOC~~ SOLN
10.0000 [IU] | Freq: Once | SUBCUTANEOUS | Status: AC
  Administered 2017-06-30: 10 [IU] via SUBCUTANEOUS

## 2017-06-30 MED ORDER — RISPERIDONE 1 MG PO TABS: 1.0000 mg | ORAL_TABLET | Freq: Every day | ORAL | Status: DC

## 2017-06-30 MED ORDER — RISPERIDONE 1 MG PO TABS
1.0000 mg | ORAL_TABLET | Freq: Every day | ORAL | Status: DC
  Administered 2017-06-30 – 2017-07-02 (×3): 1 mg via ORAL
  Filled 2017-06-30 (×3): qty 1

## 2017-06-30 MED ORDER — MIRTAZAPINE 45 MG PO TABS: 45.0000 mg | ORAL_TABLET | Freq: Every day | ORAL | Status: DC

## 2017-06-30 NOTE — Progress Notes (Addendum)
Pt with flat affect. 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 Pt refused to be repositioned to her sides. Placed a consult to [facility] nurse, and will replace bed to a low air loss mattress bed. 1500 Replaced bed with a low air loss mattress bed. Sacral ulcer dressing changed, 9cm x 1.4cm x 7cm stage IV sacral ulcer noted. MASD noted to peri area, groin and inner thighs. Antifungal powder applied.  1800 large amount of urine leaking from the foley cath site. Foley cath replaced.

## 2017-06-30 NOTE — Progress Notes (Addendum)
Subjective:  It does appear that she had some of her breakfast this morning, but she continues to endorse difficulty swallowing. She again denies pain with swallowing or esophageal/chest pain. She denies abdominal pain or nausea. She denies a sensation of food getting stuck. She states that it is just difficult to swallow. She denies coughing with swallowing.  Objective:  Vital signs in last 24 hours: Vitals:   06/29/17 0427 06/29/17 1317 06/29/17 2141 06/30/17 0615  BP: (!) 147/81 (!) 148/81 (!) 145/80 (!) 147/72  Pulse: 80 84 87 88  Resp:  16 18 16   Temp: 98.3 F (36.8 C) 98.9 F (37.2 C) 98.9 F (37.2 C) 98.7 F (37.1 C)  TempSrc: Oral Oral Oral Oral  SpO2: 95% 100% 97% 98%  Weight:      Height:       GEN: Resting in bed, appears in no distress CV: NR & RR, 2/6 systolic murmur best heard at RUSB PULM: CTAB, no wheezes or rales EXT: Bilateral heel booties. Trace LE edema. PSYCH: Depressed and flattened affect. Catatonic? Provides mostly short "yes" or "no" answers to questions. SKIN: Sacral wound not assessed this morning (See photos under media tab from 5/1)  Assessment/Plan:  Debra [name] is a 52yo female with PMH of MS resulting in neurogenic bladder (with chronic indwelling foley), type 1 diabetes, HTN, CKD III, chronic sacral wound infection, and depression who presented with sepsis and DKA, requiring intubation and ICU admission initially. DKA has resolved and she is currently being treated for methicillin resistant CoNS bacteremia. Her hospital course is complicated by dysphagia and what seems to be catatonic depression vs fatigue 2/2 MS.  Methicillin Resistant CoNS Bacteremia Sacral wound infection Likely source is PICC line that had been present since March admission. PICC line has been removed. She remains afebrile. Repeat BCx from 5/2 show NGTD x1d. ID has been consulted and she is currently on daptomycin and meropenem. Once BCx negative x72h, can replace PICC and will  likely need another prolonged course of antibiotics. - ID consulted, appreciate their assistance - Wound care recommendations appreciated - Per surgery, no further debridement at this time - Continue IV daptomycin 500mg  q24h and IV meropenem 1g q8h (4/30 -> present) - Monitoring CK while on daptomycin - Follow up repeat BCx 5/2 -> NGTD x1d --- When BCx negative x72h, may be able to get new PICC - Monitor fever curve - CBC in AM - Oxy IR 10mg  q6h PRN for sacral pain  Multiple Sclerosis (primary progressive) Diagnosed in 10/2016. At baseline, she has bilateral LE weakness, neurogenic bladder requiring chronic indwelling foley, and fecal incontinence. She has had a complicated course since her diagnosis, requiring multiple re-hospitalizations. She started rituximab infusions for maintenance in Nov 2018 and was intended to follow up with Neurology (Dr. Epimenio Foot) as an outpatient for Rituximab infusions in Dec 2018, however this was not completed. She was recently seen by Dr. Epimenio Foot on 4/17, at which time he recommended not continuing rituximab due to her active infection at the time (was being treated for sacral osteomyelitis). She was transitioned to teriflunomide due its lower immunosuppressive effect. He also recommended considering modafinil or armodafinil if she continued to have lethargy.   MRI brain and c-spine was obtained which shows old lesions, as well as possible mild active lesion in R splenium and R cerebellar peduncle. - Continue home teriflunomide - Start modafinil 100mg  daily for fatigue 2/2 MS, may need uptitration  Hx of depression with psychotic features Mood continues to  be depressed with flattened affect and ?catatonia. She was not receiving her home anti-depressants during this admission. Will restart these and consider Psych consult on Monday. Has required inpatient psych admission due to suicidal ideation in the past. Has previously been treated with fluoxetine and cymbalta.  Mirtazapine started during most recent admission 04/2017. - Restart home mirtazapine 45mg  QHS - Restart home risperidone 1mg  QHS - Consider Psych consult Monday  Oropharyngeal dysphagia Denies odynophagia, chest pain, abdominal pain, or sensation of food getting stuck. She is only able to describe that she has trouble swallowing. No coughing with swallowing. She had SLP eval yesterday, which showed no issues, and she was advanced to full liquid diet. Differential includes dysphagia 2/2 depression vs MS fatigue or active lesion. MRI brain not consistent with lesion in brainstem. Could alternatively be due to chronic illness and failure to thrive. Less likely esophageal candidiasis given that she does not report any pain. - FLD - Restart home mirtazapine - Start modafinil 100mg  daily for fatigue 2/2 MS, may need uptitration - Treatment of depression and MS, as above  Hypertension Home medications include amlodipine, lisinopril, and metoprolol which have all been held since admission 2/2 sepsis. BP this morning is in the 140s systolic. - Continue monitoring her BP, can add on home medications if needed  AKI, resolved Neurogenic Bladder, with chronic indwelling foley - Continue home Bethanecol and Flomax - BMET in AM - I/O monitoring  Hx of type 1 diabetes Labile BG Poor oral intake Hypoglycemic yesterday. Insulin regimen was decreased, she was started on D5-1/2NS due to being NPO. Since yesterday, she was restarted on full liquid diet per SLP and with D5-1/2 NS continued, BG went up to 428 this morning. Treating with 10 units Novolog. BG have been difficult to control during prior admissions in the setting of poor intake and infection. Home medications are Lantus 6 units and Novolog sliding scale.  - Continue Lantus 6 units QHS and SSI-S TID - SLP eval -> FLD - D/c D5-1/2NS - Continue feeding supplements - Will restart home mirtazapine 45mg  daily  Dispo: Anticipated discharge pending  management of bacteremia.  Scherrie Gerlach, MD 06/30/2017, 6:49 AM Pager: [phone]

## 2017-06-30 NOTE — [place] FL2 (Addendum)
[place] MEDICAID FL2 LEVEL OF CARE SCREENING TOOL     IDENTIFICATION  Patient Name: Debra [name] Birthdate: May 15, 1965 Sex: female Admission Date (Current Location): 06/26/2017  [place] and [place] Number:  [address] [id number] N Facility and Address:  [facility], [address] [place], [place] [address]      Provider Number: [id number]  Attending Physician Name and Address:  Doneen Poisson, MD  Relative Name and Phone Number:       Current Level of Care: Hospital Recommended Level of Care: Skilled Nursing Facility Prior Approval Number:    Date Approved/Denied:   PASRR Number:    Discharge Plan: SNF    Current Diagnoses: Patient Active Problem List   Diagnosis Date Noted  . Staphylococcus epidermidis sepsis (HCC)   . PICC line infection, subsequent encounter   . Severe sepsis (HCC) 06/26/2017  . Acute metabolic encephalopathy   . Acute respiratory failure (HCC)   . Acute pyelonephritis   . Normocytic anemia 05/25/2017  . Decubitus ulcer of coccygeal region, stage IV (HCC) 05/21/2017  . 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 Proteus infection 05/06/2017  . Foley catheter in place on admission 05/06/2017  . Hyperglycemia 05/05/2017  . Bacteriuria 05/05/2017  . Recurrent UTI   . Palliative care encounter   . Goals of care, counseling/discussion   . Pressure injury of skin 04/14/2017  . Aortic atherosclerosis (HCC) 03/14/2017  . Major depressive disorder, single episode, severe without psychosis (HCC) 01/02/2017  . Dysthymia 12/27/2016  . HTN (hypertension)   . Neurogenic bladder   . Labile blood pressure   . Leukocytosis   . Labile blood glucose   . Sleep disturbance   . Multiple sclerosis (HCC) 11/22/2016  . Thoracic root lesion 11/22/2016  . Neuropathic pain   . Leg weakness, bilateral 11/20/2016  . History of CVA (cerebrovascular accident)   .  Uncontrolled type 1 diabetes mellitus with diabetic peripheral neuropathy (HCC)   . Diabetic acidosis (HCC) 11/15/2016  . Back pain 10/31/2016  . AKI (acute kidney injury) (HCC)   . Stable angina pectoris (HCC)   . Vitamin D deficiency 08/31/2014  . 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 Hyperlipidemia 10/06/2013  . Chronic diarrhea 10/19/2010  . Peripheral neuropathy 08/19/2010  . Type 1 diabetes mellitus (HCC) 07/06/2010  . Hypertension 07/06/2010  . Asthma 07/06/2010    Orientation RESPIRATION BLADDER Height & Weight     Time, Self, Place  Normal Incontinent, Indwelling catheter Weight: 133 lb 9.6 oz (60.6 kg) Height:  5\' 5"  (165.1 cm)  BEHAVIORAL SYMPTOMS/MOOD NEUROLOGICAL BOWEL NUTRITION STATUS      Incontinent Diet(see discharge summary)  AMBULATORY STATUS COMMUNICATION OF NEEDS Skin   Total Care Verbally PU Stage and Appropriate Care, Skin abrasions, Other (Comment)(Unstageable PU on sacrum foam dressing; MASD groin/perineum/buttocks; Blister with blood on hip; Abrasion leg and buttocks; Skin Tear on buttocks) PU Stage 1 Dressing: (R Heel & L Foot)                     Personal Care Assistance Level of Assistance  Bathing, Feeding, Dressing Bathing Assistance: Maximum assistance Feeding assistance: Maximum assistance Dressing Assistance: Maximum assistance     Functional Limitations Info  Sight, Hearing, Speech Sight Info: Adequate Hearing Info: Adequate Speech Info: Adequate(delayed)    SPECIAL CARE FACTORS FREQUENCY  PT (By licensed PT), OT (By licensed OT)     PT  Frequency: 5x week OT Frequency: 5x week            Contractures Contractures Info: Not present    Additional Factors Info  Code Status, Allergies, Isolation Precautions  Insulin Sliding Scale; Psychotropic Medications Code Status Info: Full Code Allergies Info: PENICILLINS, LACTOSE INTOLERANCE (GI), POLLEN EXTRACT, TAPE      Isolation Precautions Info:  ESBL; VRE  Insulin:Lantus 6units injection at bedtime; novoLOG injection 0-9units 3 times daily with meals  Psychotropic: Remeron 45mg  tablet PO at bedtime; Risperdal tablet 1mg  PO bedtime   Current Medications (06/30/2017):  This is the current hospital active medication list Current Facility-Administered Medications  Medication Dose Route Frequency Provider Last Rate Last Dose  . bethanechol (URECHOLINE) tablet 50 mg  50 mg Oral TID Max Fickle B, MD   50 mg at 06/30/17 1541  . chlorhexidine (PERIDEX) 0.12 % solution 15 mL  15 mL Mouth Rinse BID Max Fickle B, MD   15 mL at 06/30/17 0842  . collagenase (SANTYL) ointment   Topical Daily Max Fickle B, MD      . DAPTOmycin (CUBICIN) 500 mg in sodium chloride 0.9 % IVPB  500 mg Intravenous Q24H Della Goo, [facility]   Stopped at 06/30/17 1255  . feeding supplement (BOOST / RESOURCE BREEZE) liquid 1 Container  1 Container Oral BID BM Doneen Poisson, MD   1 Container at 06/29/17 2312  . feeding supplement (ENSURE ENLIVE) (ENSURE ENLIVE) liquid 237 mL  237 mL Oral BID BM Doneen Poisson, MD   237 mL at 06/29/17 1741  . heparin injection 5,000 Units  5,000 Units Subcutaneous Q8H Max Fickle B, MD   5,000 Units at 06/30/17 1326  . insulin aspart (novoLOG) injection 0-9 Units  0-9 Units Subcutaneous TID WC Gwynn Burly, DO   7 Units at 06/30/17 1223  . insulin glargine (LANTUS) injection 6 Units  6 Units Subcutaneous QHS Gwynn Burly, DO   6 Units at 06/29/17 2217  . MEDLINE mouth rinse  15 mL Mouth Rinse q12n4p Max Fickle B, MD   15 mL at 06/30/17 1224  . meropenem (MERREM) 1 g in sodium chloride 0.9 % 100 mL IVPB  1 g Intravenous Q8H [name], MD   Stopped at 06/30/17 1400  . mirtazapine (REMERON) tablet 45 mg  45 mg Oral QHS Reymundo Poll, MD      . modafinil (PROVIGIL) tablet 100 mg  100 mg Oral Daily Reymundo Poll, MD   100 mg at 06/30/17 1223  . ondansetron (ZOFRAN) injection 4 mg  4 mg  Intravenous Q6H PRN Max Fickle B, MD      . oxyCODONE (Oxy IR/ROXICODONE) immediate release tablet 10 mg  10 mg Oral Q6H PRN Beola Cord, MD   10 mg at 06/30/17 1540  . risperiDONE (RISPERDAL) tablet 1 mg  1 mg Oral QHS Guilloud, Eber Jones, MD      . tamsulosin [facility]) capsule 0.4 mg  0.4 mg Oral Daily Max Fickle B, MD   0.4 mg at 06/30/17 1610     Discharge Medications: Please see discharge summary for a list of discharge medications.  Relevant Imaging Results:  Relevant Lab Results:   Additional Information SS# [id number] [place], [place]

## 2017-07-01 LAB — CBC
HCT: 40.1 % (ref 36.0–46.0)
Hemoglobin: 13.4 g/dL (ref 12.0–15.0)
MCH: 28.5 pg (ref 26.0–34.0)
MCHC: 33.4 g/dL (ref 30.0–36.0)
MCV: 85.1 fL (ref 78.0–100.0)
PLATELETS: 209 10*3/uL (ref 150–400)
RBC: 4.71 MIL/uL (ref 3.87–5.11)
RDW: 16.6 % — AB (ref 11.5–15.5)
WBC: 10.4 10*3/uL (ref 4.0–10.5)

## 2017-07-01 LAB — BASIC METABOLIC PANEL
ANION GAP: 8 (ref 5–15)
BUN: 9 mg/dL (ref 6–20)
CHLORIDE: 104 mmol/L (ref 101–111)
CO2: 25 mmol/L (ref 22–32)
CREATININE: 0.79 mg/dL (ref 0.44–1.00)
Calcium: 8.6 mg/dL — ABNORMAL LOW (ref 8.9–10.3)
GFR calc non Af Amer: >60 mL/min (ref >60)
GLUCOSE: 93 mg/dL (ref 65–99)
POTASSIUM: 3.6 mmol/L (ref 3.5–5.1)
SODIUM: 137 mmol/L (ref 135–145)

## 2017-07-01 LAB — GLUCOSE, CAPILLARY
GLUCOSE-CAPILLARY: 96 mg/dL (ref 65–99)
Glucose-Capillary: 95 mg/dL (ref 65–99)
Glucose-Capillary: 127 mg/dL — ABNORMAL HIGH (ref 65–99)
Glucose-Capillary: 107 mg/dL — ABNORMAL HIGH (ref 65–99)
Glucose-Capillary: 122 mg/dL — ABNORMAL HIGH (ref 65–99)

## 2017-07-01 MED ORDER — HYDROMORPHONE HCL 2 MG/ML IJ SOLN
0.5000 mg | Freq: Once | INTRAMUSCULAR | Status: AC
  Administered 2017-07-01: 0.5 mg via INTRAVENOUS
  Filled 2017-07-01: qty 1

## 2017-07-01 MED ORDER — GABAPENTIN 300 MG PO CAPS
300.0000 mg | ORAL_CAPSULE | Freq: Three times a day (TID) | ORAL | Status: DC
Start: 2017-07-01 — End: 2017-07-02
  Administered 2017-07-01 – 2017-07-02 (×4): 300 mg via ORAL
  Filled 2017-07-01 (×4): qty 1

## 2017-07-01 NOTE — Progress Notes (Addendum)
Subjective: Patient is alert this morning and conversational, which is a significant improvement from yesterday. Patient reports she is feeling depressed and hopeless, inquiring about her MRI results from yesterday and chances of neurologic recovery. We discussed that treatment options for her MS are currently limited with her active infection. Once her infection is adequately treated, she will need to follow up with her outpatient neurologist for discussion on further therapy. We discussed having psychiatry come by to assist in treatment of her depression and she is agreeable to speaking with them. Her home Remeron and Risperdal were restarted yesterday. She was also started on modafinil for possible MS lethargy. She reports she cannot tell an improvement with this medication yet, however she seems significantly improved to me. Patient says she has been tolerating a liquid diet well without dysphagia, but has poor appetite because the food is bland. We discussed possibly advancing diet as tolerated. She is also complaining severe bilateral leg pain that she describes as sharp and shooting, unrelieved with oxy 10 mg. Lastly, she is requesting ice cream this morning. Nursing secretary made aware.   Objective:  Vital signs in last 24 hours: Vitals:   06/30/17 2243 07/01/17 0300 07/01/17 0503 07/01/17 0533  BP: (!) 163/78   (!) 146/92  Pulse: 71  82 79  Resp: 14     Temp: 97.8 F (36.6 C)  (!) 97.5 F (36.4 C)   TempSrc: Oral  Oral   SpO2: 100%  100%   Weight:  138 lb (62.6 kg)    Height:       GEN: Resting in bed, appears in no distress CV: NR & RR, 2/6 systolic murmur best heard at RUSB PULM: CTAB, no wheezes or rales EXT: Bilateral heel booties. Trace LE edema. Moving bilateral upper extremities against gravity, unable to press call button for RN due to weakness.  PSYCH: Depressed mood but improved affect. Conversational.  SKIN: Sacral wound not assessed this morning (See photos under  media tab from 5/1)  Assessment/Plan:  Ms. Fawbush is a 52yo female with PMH of MSresulting inneurogenic bladder (with chronic indwelling foley), type 1 diabetes, HTN, CKDIII, chronic sacral wound infection,and depression who presented with sepsis and DKA, requiring intubation and ICU admission initially. DKA has resolved and she is currently being treated for methicillin resistant CoNS bacteremia. Her hospital course is complicated by dysphagia / poor PO intake and what seems to be catatonic depression vs fatigue 2/2 MS.  Methicillin Resistant CoNS Bacteremia Sacral wound infection Likely source is PICC line that had been present since March admission. PICC line has been removed. She remains afebrile. Repeat BCx from 5/2 show NGTD x2d. ID has been consulted and she is currently on daptomycin and meropenem (day 6). Once BCx negative x72h, can replace PICC and will likely need another prolonged course of antibiotics. - ID consulted, appreciate their assistance - Wound care recommendations appreciated - Per surgery, no further debridement at this time - Continue IV daptomycin 500mg  q24h and IV meropenem 1g q8h (4/30 -> present) - Monitoring CK while on daptomycin - Follow up repeat BCx 5/2 -> NGTD x2d - New PICC once blood Cx are negative x 72 hours - Monitor fever curve - Oxy IR 10mg  q6h PRN for sacral pain; IV dilaudid 0.5 mg x 1 today  Multiple Sclerosis (?primary progressive) Diagnosed in 10/2016. At baseline, she has bilateral LE weakness, neurogenic bladder requiring chronic indwelling foley, and fecal incontinence. She has had a complicated course since her diagnosis, requiring  multiple re-hospitalizations. She started rituximab infusions for maintenance in Nov 2018 and was intended to follow up with Neurology (Dr. Epimenio Foot) as an outpatient for Rituximab infusions in Dec 2018, however this was not completed. She was recently seen by Dr. Epimenio Foot on 4/17, at which time he recommended not  continuing rituximab due to her active infection at the time (was being treated for sacral osteomyelitis). She was transitioned to teriflunomide due its lower immunosuppressive effect. He also recommended considering modafinil or armodafinil if for MS lethargy if needed. Modafinil was started yesterday 5/4.  - Holding home teriflunomide in the setting of bacteremia  - Start modafinil 100mg  daily for fatigue 2/2 MS, may need up titration, appears improved today - Will start gabapentin 300 mg TID for her neuropathic leg pain   Hx of depression with psychotic features Affect significantly improved today. Endorsing depression & feelings of hopelessness. Reports no prior medications have helped, only improvement in her clinical condition that improve her mood. She is agreeable to a psych consult on Monday. Home Remeron and Risperdal were restarted 5/4.  - Continue home mirtazapine 45mg  QHS - Continue home risperidone 1mg  QHS - Psych consult Monday  Oropharyngeal dysphagia / Poor PO intake  Improved today with initiation of modafinil yesterday and restarting home Remeron and Risperdal. Likely secondary to severe depression / MS lethargy. Reports she is tolerating liquid diet now without issue, but continues to have poor appetite due to food being bland. Requesting ice cream today.  - FLD -> Advance as tolerated  - Continue home mirtazapine - Continue modafinil 100mg  daily for fatigue 2/2 MS, may need uptitration - Treatment of depression and MS, as above  Hypertension Home medications include amlodipine, lisinopril, and metoprolol which have all been held since admission 2/2 sepsis. BP this morning is in the 140s systolic. - Continue monitoring her BP, can add on home medications if needed  AKI, resolved Neurogenic Bladder, with chronic indwelling foley - Continue home Bethanecol and Flomax - BMET in AM - I/O monitoring  Hx of type 1 diabetes Labile BG Poor oral intake Initially  presented with DKA that was treated & resolved. Then became hypoglycemic and placed on D5-1/2NS. Hyperglycemic yesterday in the 400s. D5-1/2 NS was stopped & she was given 10 units of novolog. BG have been difficult to control in the setting of poor intake and infection. Home medications are Lantus 6 units and Novolog sliding scale.  - Continue Lantus 6 units QHS and SSI-S TID - Continue feeding supplements - Will restart home mirtazapine 45mg  daily  Dispo: Anticipated discharge pending management of her bacteremia, PICC line placement for long term antibiotics, and psych evaluation on Monday.   Reymundo Poll, MD 07/01/2017, 7:21 AM Pager: [phone]

## 2017-07-01 NOTE — Progress Notes (Signed)
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 MRSE bacteremia   Allergies  Allergen Reactions  . Penicillins Anaphylaxis, Nausea And Vomiting and Rash    Has patient had a PCN reaction causing immediate rash, facial/tongue/throat swelling, SOB or lightheadedness with hypotension: Yes Has patient had a PCN reaction causing severe rash involving mucus membranes or skin necrosis: Yes Has patient had a PCN reaction that required hospitalization No Has patient had a PCN reaction occurring within the last 10 years: Yes If all of the above answers are "NO", then may proceed with Cephalosporin use.   . Lactose Intolerance (Gi) Other (See Comments)  . Pollen Extract Other (See Comments)    Seasonal Allergies  . Tape Rash    Patient Measurements: Height: 5\' 5"  (165.1 cm) Weight: 138 lb (62.6 kg) IBW/kg (Calculated) : 57 Adjusted Body Weight:   Vital Signs: Temp: 97.5 F (36.4 C) (05/05 0503) Temp Source: Oral (05/05 0503) BP: 146/92 (05/05 0533) Pulse Rate: 79 (05/05 0533) Intake/Output from previous day: 05/04 0701 - 05/05 0700 In: 740 [P.O.:440; IV Piggyback:300] Out: 450 [Urine:450] Intake/Output from this shift: Total I/O In: 120 [P.O.:120] Out: -   Labs: Recent Labs    06/30/17 0453 07/01/17 0412  WBC  --  10.4  HGB  --  13.4  PLT  --  209  CREATININE 1.05* 0.79   Estimated Creatinine Clearance: 75.7 mL/min (by C-G formula based on SCr of 0.79 mg/dL). No results for input(s): VANCOTROUGH, VANCOPEAK, VANCORANDOM, GENTTROUGH, GENTPEAK, GENTRANDOM, TOBRATROUGH, TOBRAPEAK, TOBRARND, AMIKACINPEAK, AMIKACINTROU, AMIKACIN in the last 72 hours.   Microbiology:   Medical History: Past Medical History:  Diagnosis Date  . Adenomatous colonic polyps   . Anemia    2005  . Anxiety    1990  . Arthritis    "knees, hands" (05/07/2017)  . Asthma    2000  . Benign essential HTN   . Cataract   . Chronic lower back  pain   . Depression with psychotic symptoms   . Diabetic ketoacidosis without coma associated with type 1 diabetes mellitus (HCC)   . Difficult intubation    narrow airway  . Fibromyalgia 11/07/2016  . Gastroesophageal Reflux Disease (GERD)   . Heart murmur    Birth  . Hyperlipidemia    2005  . Hypertension    1998  . Internal hemorrhoids 04/24/06   on colonoscopy  . Migraine    "used to have them all the time; now maybe q6 months" (05/07/2017)  . Multiple sclerosis (HCC)   . Neuropathic bladder   . Neuropathy of the hands & feet   . Restless Leg Syndrome   . Sepsis due to urinary tract infection (HCC) 04/12/2017  . Sleep paralysis   . Stable angina pectoris    2007: cath showing normal cors.   . Stroke 1990   denies residual on 05/07/2017  . 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 MRSE bacteremia (old PICC removed).  WBC 10.4 down, PCT 6.21>4.55>2.03, AFeb - Replace PICC when BC negative x 72 hrs  Meropenem 4/30>> Dapto 4/30>> --5/3: CK 118   5/2 BC x 2>> 5/1: Trach aspirate: Few yeast, normal flora 4/30 blood: 3/4 MRSE  4/30 urine neg 4/30 resp pending   Goal of Therapy:  Eradication of infection  Plan:  Meropenem to 1 gm q 8 hours  Continue Dapto to 8 mg/kg q24h Monitor renal fx cx  CK q fri  Heidi Lemay S. Merilynn Finland, PharmD, BCPS Clinical Staff Pharmacist Pager [phone]  Misty Stanley Stillinger 07/01/2017,11:40 AM

## 2017-07-02 DIAGNOSIS — K6139 Other ischiorectal abscess: Secondary | ICD-10-CM

## 2017-07-02 DIAGNOSIS — R450 Nervousness: Secondary | ICD-10-CM

## 2017-07-02 DIAGNOSIS — F331 Major depressive disorder, recurrent, moderate: Secondary | ICD-10-CM

## 2017-07-02 DIAGNOSIS — G4700 Insomnia, unspecified: Secondary | ICD-10-CM

## 2017-07-02 DIAGNOSIS — R630 Anorexia: Secondary | ICD-10-CM

## 2017-07-02 DIAGNOSIS — Z560 Unemployment, unspecified: Secondary | ICD-10-CM

## 2017-07-02 DIAGNOSIS — A4101 Sepsis due to Methicillin susceptible Staphylococcus aureus: Secondary | ICD-10-CM

## 2017-07-02 DIAGNOSIS — F419 Anxiety disorder, unspecified: Secondary | ICD-10-CM

## 2017-07-02 LAB — GLUCOSE, CAPILLARY
GLUCOSE-CAPILLARY: 25 mg/dL — AB (ref 65–99)
GLUCOSE-CAPILLARY: 96 mg/dL (ref 65–99)
Glucose-Capillary: 99 mg/dL (ref 65–99)
Glucose-Capillary: 98 mg/dL (ref 65–99)
Glucose-Capillary: 108 mg/dL — ABNORMAL HIGH (ref 65–99)
Glucose-Capillary: 117 mg/dL — ABNORMAL HIGH (ref 65–99)

## 2017-07-02 MED ORDER — INSULIN GLARGINE 100 UNIT/ML ~~LOC~~ SOLN
4.0000 [IU] | Freq: Every day | SUBCUTANEOUS | Status: DC
  Administered 2017-07-02 – 2017-07-04 (×3): 4 [IU] via SUBCUTANEOUS
  Filled 2017-07-02 (×4): qty 0.04

## 2017-07-02 MED ORDER — HYDROMORPHONE HCL 2 MG/ML IJ SOLN
0.5000 mg | Freq: Three times a day (TID) | INTRAMUSCULAR | Status: DC | PRN
  Administered 2017-07-02 – 2017-07-03 (×2): 0.5 mg via INTRAVENOUS
  Filled 2017-07-02 (×2): qty 1

## 2017-07-02 MED ORDER — DEXTROSE 50 % IV SOLN
INTRAVENOUS | Status: AC
  Administered 2017-07-02: 08:00:00
  Filled 2017-07-02: qty 50

## 2017-07-02 NOTE — Consult Note (Signed)
[facility] Psychiatry Consult   Reason for Consult:  Depression  Referring Physician:  EDP Patient Identification: Debra [name] MRN:  [id number] Principal Diagnosis: major depressive disorder, recurrent, moderate Diagnosis:   Patient Active Problem List   Diagnosis Date Noted  . Major depressive disorder, recurrent episode, moderate ([place]) [F33.1] 07/02/2017    Priority: High  . Staphylococcus epidermidis sepsis ([place]) [A41.1]   . PICC line infection, subsequent encounter [T80.219D]   . Severe sepsis ([place]) [A41.9, R65.20] 06/26/2017  . Acute metabolic encephalopathy [J09.32]   . Acute respiratory failure ([place]) [J96.00]   . Acute pyelonephritis [N10]   . Normocytic anemia [D64.9] 05/25/2017  . Decubitus ulcer of coccygeal region, stage IV ([place]) [L89.154] 05/21/2017  . 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 Bacteremia due to vancomycin resistant Enterococcus [R78.81, Z16.21] 05/06/2017  . Proteus infection [A49.8] 05/06/2017  . Foley catheter in place on admission [Z96.0] 05/06/2017  . Hyperglycemia [R73.9] 05/05/2017  . Bacteriuria [R82.71] 05/05/2017  . Recurrent UTI [N39.0]   . Palliative care encounter [Z51.5]   . Goals of care, counseling/discussion [Z71.89]   . Pressure injury of skin [L89.90] 04/14/2017  . Aortic atherosclerosis ([place]) [I70.0] 03/14/2017  . Major depressive disorder, single episode, severe without psychosis ([place]) [F32.2] 01/02/2017  . Dysthymia [F34.1] 12/27/2016  . HTN (hypertension) [I10]   . Neurogenic bladder [N31.9]   . Labile blood pressure [R09.89]   . Leukocytosis [D72.829]   . Labile blood glucose [R73.09]   . Sleep disturbance [G47.9]   . Multiple sclerosis ([place]) [G35] 11/22/2016  . Thoracic root lesion [G54.3] 11/22/2016  . Neuropathic pain [M79.2]   . Leg weakness, bilateral [R29.898] 11/20/2016  . History of CVA (cerebrovascular accident) [Z86.73]   . Uncontrolled type 1 diabetes mellitus with diabetic peripheral  neuropathy (HCC) [E10.42, E10.65]   . Diabetic acidosis ([place]) [E13.10] 11/15/2016  . Back pain [M54.9] 10/31/2016  . AKI (acute kidney injury) ([place]) [N17.9]   . Stable angina pectoris ([place]) [I20.8]   . Vitamin D deficiency [E55.9] 08/31/2014  . 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 Hyperlipidemia [E78.5] 10/06/2013  . Chronic diarrhea [K52.9] 10/19/2010  . Peripheral neuropathy [G62.9] 08/19/2010  . Type 1 diabetes mellitus ([place]) [E10.9] 07/06/2010  . Hypertension [I10] 07/06/2010  . Asthma [J45.909] 07/06/2010    Total Time spent with patient: 45 minutes  Subjective:   Debra [name] is a 52 y.o. female patient does not warrant a psychiatric admission.  Recommendations:  Discontinue Risperdal 1 mg at bedtime and start Cymbalta 30 mg daily for depression and anxiety.  Dr. Dwyane Dee, psychiatrist, has reviewed this patient and concurs with the recommendations.  HPI:  52 yo female who is in the hospital for her MS issues.  Her depression is a 7/10 with no suicidal ideations, 10/10 anxiety.  No hallucinations or homicidal ideations.  She was at [facility] in 2018 and started on Risperdal 1 mg at bedtime for psychosis, resolved by discharge and Cymbalta 30 mg daily for depression.  Now, she is receiving Risperdal 1 mg at bedtime and Remeron 45 mg at bedtime for sleep and appetite.  Recommend stopping the Risperdal and start Cymbalta 30 mg daily for depression and anxiety.  Continue Remeron.  Past Psychiatric History: depression  Risk to Self: Is patient at risk for suicide?: No Risk to Others:  No Prior Inpatient Therapy:  [facility] Prior Outpatient Therapy:  Yes  Past Medical History:  Past Medical History:  Diagnosis Date  . Adenomatous  colonic polyps   . Anemia    2005  . Anxiety    1990  . Arthritis    "knees, hands" (05/07/2017)  . Asthma    2000  . Benign essential HTN   . Cataract   . Chronic lower back pain   . Depression with psychotic  symptoms   . Diabetic ketoacidosis without coma associated with type 1 diabetes mellitus ([place])   . Difficult intubation    narrow airway  . Fibromyalgia 11/07/2016  . Gastroesophageal Reflux Disease (GERD)   . Heart murmur    Birth  . Hyperlipidemia    2005  . Hypertension    1998  . Internal hemorrhoids 04/24/06   on colonoscopy  . Migraine    "used to have them all the time; now maybe q6 months" (05/07/2017)  . Multiple sclerosis ([place])   . Neuropathic bladder   . Neuropathy of the hands & feet   . Restless Leg Syndrome   . Sepsis due to urinary tract infection ([place]) 04/12/2017  . Sleep paralysis   . Stable angina pectoris    2007: cath showing normal cors.   . Stroke 1990   denies residual on 05/07/2017  . Type I Diabetes Mellitus 1988    Past Surgical History:  Procedure Laterality Date  . BREAST SURGERY Left    biopsy left breast  . CARDIAC CATHETERIZATION  ~ 2007/2008  . COLONOSCOPY W/ BIOPSIES AND POLYPECTOMY    . DEBRIDMENT OF DECUBITUS ULCER N/A 05/21/2017   Procedure: DEBRIDMENT OF SACRAL DECUBITUS ULCER;  Surgeon: Fanny Skates, MD;  Location: [facility];  Service: General;  Laterality: N/A;  . DILATION AND CURETTAGE OF UTERUS  12/29/2010   Surgeon: Osborne Oman, MD;  Location: [facility];  Service: Gynecology  . ENDOMETRIAL ABLATION W/ NOVASURE N/A 12/2009  . EYE SURGERY Bilateral    "lots; laser tto correct broken blood vessels from diabetes"  . FOOT SURGERY Bilateral    "bones fused; bones removed"  . FRACTURE SURGERY    . HYSTEROSCOPY  12/29/2010   Procedure: HYSTEROSCOPY WITH HYDROTHERMAL ABLATION;  Surgeon: Osborne Oman, MD;  Location: [facility];  Service: Gynecology;;  . IUD REMOVAL  12/29/2010   Procedure: INTRAUTERINE DEVICE (IUD) REMOVAL;  Surgeon: Osborne Oman, MD;  Location: [facility];  Service: Gynecology;  Laterality: N/A;  . LAPAROSCOPIC CHOLECYSTECTOMY    . LAPAROSCOPIC TUBAL LIGATION  12/29/2010   Procedure: LAPAROSCOPIC TUBAL LIGATION;  Surgeon:  Osborne Oman, MD;  Location: [facility];  Service: Gynecology;  Laterality: Bilateral;  . ORIF ANKLE FRACTURE Right 10/09/2013   Procedure: Open reduction internal fixation right ankle;  Surgeon: Nita Sells, MD;  Location: [facility];  Service: Orthopedics;  Laterality: Right;  Open reduction internal fixation right ankle  . TRIGGER FINGER RELEASE     x 3  . TUBAL LIGATION     Family History:  Family History  Problem Relation Age of Onset  . Hypertension Mother   . Kidney disease Mother   . Hypertension Father   . Breast cancer Maternal Grandmother   . Prostate cancer Maternal Grandfather   . Ovarian cancer Paternal Grandmother   . Prostate cancer Paternal Grandfather   . Colon cancer Maternal Uncle        Family history of malignant neoplasm of gastrointestinal tract   Family Psychiatric  History: none Social History:  Social History   Substance and Sexual Activity  Alcohol Use No  . Alcohol/week: 0.0 oz  Social History   Substance and Sexual Activity  Drug Use No   Comment: drug addict    Social History   Socioeconomic History  . Marital status: Divorced    Spouse name: Not on file  . Number of children: 2  . Years of education: 62  . Highest education level: Not on file  Occupational History  . Occupation: Unemployed  Social Needs  . Financial resource strain: Not on file  . Food insecurity:    Worry: Not on file    Inability: Not on file  . Transportation needs:    Medical: Not on file    Non-medical: Not on file  Tobacco Use  . Smoking status: Never Smoker  . Smokeless tobacco: Never Used  Substance and Sexual Activity  . Alcohol use: No    Alcohol/week: 0.0 oz  . Drug use: No    Comment: drug addict  . Sexual activity: Not Currently    Birth control/protection: Other-see comments  Lifestyle  . Physical activity:    Days per week: Not on file    Minutes per session: Not on file  . Stress: Not on file  Relationships  . Social  connections:    Talks on phone: Not on file    Gets together: Not on file    Attends religious service: Not on file    Active member of club or organization: Not on file    Attends meetings of clubs or organizations: Not on file    Relationship status: Not on file  Other Topics Concern  . Not on file  Social History Narrative   Lost medicaid about 2009 ish when her youngest child turned 46. Has adult children. Lives with boyfriend who financially supports her. Has attempted to get disability but has been turned down.      2-3 caffeine drinks daily    Additional Social History:    Allergies:   Allergies  Allergen Reactions  . Penicillins Anaphylaxis, Nausea And Vomiting and Rash    Has patient had a PCN reaction causing immediate rash, facial/tongue/throat swelling, SOB or lightheadedness with hypotension: Yes Has patient had a PCN reaction causing severe rash involving mucus membranes or skin necrosis: Yes Has patient had a PCN reaction that required hospitalization No Has patient had a PCN reaction occurring within the last 10 years: Yes If all of the above answers are "NO", then may proceed with Cephalosporin use.   . Lactose Intolerance (Gi) Other (See Comments)  . Pollen Extract Other (See Comments)    Seasonal Allergies  . Tape Rash    Labs:  Results for orders placed or performed during the hospital encounter of 06/26/17 (from the past 48 hour(s))  Glucose, capillary     Status: Abnormal   Collection Time: 06/30/17  5:56 PM  Result Value Ref Range   Glucose-Capillary 140 (H) 65 - 99 mg/dL  Glucose, capillary     Status: Abnormal   Collection Time: 06/30/17  9:49 PM  Result Value Ref Range   Glucose-Capillary 157 (H) 65 - 99 mg/dL  Glucose, capillary     Status: None   Collection Time: 07/01/17  1:49 AM  Result Value Ref Range   Glucose-Capillary 95 65 - 99 mg/dL  Basic metabolic panel     Status: Abnormal   Collection Time: 07/01/17  4:12 AM  Result Value Ref  Range   Sodium 137 135 - 145 mmol/L   Potassium 3.6 3.5 - 5.1 mmol/L    Comment: DELTA  CHECK NOTED   Chloride 104 101 - 111 mmol/L   CO2 25 22 - 32 mmol/L   Glucose, Bld 93 65 - 99 mg/dL   BUN 9 6 - 20 mg/dL   Creatinine, Ser 0.79 0.44 - 1.00 mg/dL   Calcium 8.6 (L) 8.9 - 10.3 mg/dL   GFR calc non Af Amer >60 >60 mL/min   GFR calc Af Amer >60 >60 mL/min    Comment: (NOTE) The eGFR has been calculated using the CKD EPI equation. This calculation has not been validated in all clinical situations. eGFR's persistently <60 mL/min signify possible Chronic Kidney Disease.    Anion gap 8 5 - 15    Comment: Performed at [facility] [address], [place], [place] [address]  CBC     Status: Abnormal   Collection Time: 07/01/17  4:12 AM  Result Value Ref Range   WBC 10.4 4.0 - 10.5 K/uL   RBC 4.71 3.87 - 5.11 MIL/uL   Hemoglobin 13.4 12.0 - 15.0 g/dL   HCT 40.1 36.0 - 46.0 %   MCV 85.1 78.0 - 100.0 fL   MCH 28.5 26.0 - 34.0 pg   MCHC 33.4 30.0 - 36.0 g/dL   RDW 16.6 (H) 11.5 - 15.5 %   Platelets 209 150 - 400 K/uL    Comment: Performed at [facility] [address], [place], [place] [address]  Glucose, capillary     Status: Abnormal   Collection Time: 07/01/17  7:36 AM  Result Value Ref Range   Glucose-Capillary 127 (H) 65 - 99 mg/dL  Glucose, capillary     Status: None   Collection Time: 07/01/17 11:52 AM  Result Value Ref Range   Glucose-Capillary 96 65 - 99 mg/dL  Glucose, capillary     Status: Abnormal   Collection Time: 07/01/17  5:46 PM  Result Value Ref Range   Glucose-Capillary 107 (H) 65 - 99 mg/dL  Glucose, capillary     Status: Abnormal   Collection Time: 07/01/17  9:23 PM  Result Value Ref Range   Glucose-Capillary 122 (H) 65 - 99 mg/dL  Glucose, capillary     Status: Abnormal   Collection Time: 07/02/17  8:07 AM  Result Value Ref Range   Glucose-Capillary 25 (LL) 65 - 99 mg/dL   Comment 1 Notify RN   Glucose, capillary     Status: None    Collection Time: 07/02/17  8:45 AM  Result Value Ref Range   Glucose-Capillary 96 65 - 99 mg/dL  Glucose, capillary     Status: None   Collection Time: 07/02/17 12:44 PM  Result Value Ref Range   Glucose-Capillary 99 65 - 99 mg/dL    Current Facility-Administered Medications  Medication Dose Route Frequency Provider Last Rate Last Dose  . bethanechol (URECHOLINE) tablet 50 mg  50 mg Oral TID Juanito Doom, MD   50 mg at 07/02/17 0934  . chlorhexidine (PERIDEX) 0.12 % solution 15 mL  15 mL Mouth Rinse BID Simonne Maffucci B, MD   15 mL at 07/01/17 0842  . collagenase (SANTYL) ointment   Topical Daily Simonne Maffucci B, MD      . DAPTOmycin (CUBICIN) 500 mg in sodium chloride 0.9 % IVPB  500 mg Intravenous Q24H Susa Raring, [facility]   Stopped at 07/01/17 1316  . feeding supplement (BOOST / RESOURCE BREEZE) liquid 1 Container  1 Container Oral BID BM Oval Linsey, MD   1 Container at 07/01/17 1448  .  feeding supplement (ENSURE ENLIVE) (ENSURE ENLIVE) liquid 237 mL  237 mL Oral BID BM Oval Linsey, MD   237 mL at 06/29/17 1741  . heparin injection 5,000 Units  5,000 Units Subcutaneous Q8H Juanito Doom, MD   5,000 Units at 07/02/17 0603  . HYDROmorphone (DILAUDID) injection 0.5 mg  0.5 mg Intravenous Q8H PRN Colbert Ewing, MD      . insulin aspart (novoLOG) injection 0-9 Units  0-9 Units Subcutaneous TID WC Jule Ser, DO   1 Units at 07/01/17 [phone]  . insulin glargine (LANTUS) injection 4 Units  4 Units Subcutaneous QHS Colbert Ewing, MD      . MEDLINE mouth rinse  15 mL Mouth Rinse q12n4p Simonne Maffucci B, MD   15 mL at 07/01/17 1246  . meropenem (MERREM) 1 g in sodium chloride 0.9 % 100 mL IVPB  1 g Intravenous Q8H McQuaid, Douglas B, MD 200 mL/hr at 07/02/17 1241 1 g at 07/02/17 1241  . mirtazapine (REMERON) tablet 45 mg  45 mg Oral QHS Velna Ochs, MD   45 mg at 07/01/17 2154  . modafinil (PROVIGIL) tablet 100 mg  100 mg Oral Daily Velna Ochs, MD    100 mg at 07/02/17 0934  . ondansetron (ZOFRAN) injection 4 mg  4 mg Intravenous Q6H PRN Juanito Doom, MD   4 mg at 07/01/17 0503  . risperiDONE (RISPERDAL) tablet 1 mg  1 mg Oral QHS Velna Ochs, MD   1 mg at 07/01/17 2154  . tamsulosin (FLOMAX) capsule 0.4 mg  0.4 mg Oral Daily Simonne Maffucci B, MD   0.4 mg at 07/02/17 5427    Musculoskeletal: Strength & Muscle Tone: decreased Gait & Station: unable to stand Patient leans: N/A  Psychiatric Specialty Exam: Physical Exam  Constitutional: She is oriented to person, place, and time. She appears well-developed and well-nourished.  HENT:  Head: Normocephalic.  Respiratory: Effort normal.  Neurological: She is alert and oriented to person, place, and time.  Psychiatric: Her speech is normal and behavior is normal. Judgment and thought content normal. Her mood appears anxious. Cognition and memory are normal. She exhibits a depressed mood.    Review of Systems  Psychiatric/Behavioral: Positive for depression. The patient is nervous/anxious.   All other systems reviewed and are negative.   Blood pressure (!) 153/79, pulse 76, temperature 97.6 F (36.4 C), temperature source Oral, resp. rate 13, height 5' 5" (1.651 m), weight 62.6 kg (138 lb), SpO2 100 %.Body mass index is 22.96 kg/m.  General Appearance: Casual  Eye Contact:  Fair  Speech:  Normal Rate  Volume:  Decreased  Mood:  Anxious and Depressed  Affect:  Congruent  Thought Process:  Coherent and Descriptions of Associations: Intact  Orientation:  Full (Time, Place, and Person)  Thought Content:  Rumination  Suicidal Thoughts:  No  Homicidal Thoughts:  No  Memory:  Immediate;   Good Recent;   Good Remote;   Good  Judgement:  Good  Insight:  Good  Psychomotor Activity:  Decreased  Concentration:  Concentration: Good and Attention Span: Good  Recall:  Good  Fund of Knowledge:  Good  Language:  Good  Akathisia:  No  Handed:  Right  AIMS (if indicated):      Assets:  Leisure Time Resilience  ADL's:  Impaired  Cognition:  WNL  Sleep:        Treatment Plan Summary: Major depressive disorder, recurrent, moderate: -Discontinue Risperdal 1 mg at bedtime  -Start Cymbalta 30 mg  daily for depression and anxiety   Insomnia and appetite: -Continue Remeron 45 mg at bedtime for sleep and appetite  Disposition: No evidence of imminent risk to self or others at present.    Waylan Boga, NP 07/02/2017 1:00 PM

## 2017-07-02 NOTE — Telephone Encounter (Signed)
[facility] [phone] ext [phone] called said Debra [name] was approved for 7mg  but the script reads for 14mg . Please call to advise

## 2017-07-02 NOTE — Progress Notes (Signed)
Subjective:  Patient continues to be more alert this morning and conversational after starting modafinil and after restarting home psych meds. She was able to eat ice cream and macaroni and cheese yesterday, but states dinner was not good. She reports oxycodone does not help her pain and that the dilaudid was very helpful for her lower extremity pain, completely taking it away, which has not occurred with any of her other medications. She reports she has been on gabapentin and cymbalta in the past without relief. She was on lyrica prior to admission without relief. Discussed consulting psych today for assistance with medications, which patient is amenable to. Also plan for re-placement of PICC line now that repeat BCx are negative x72h and will follow up with ID regarding appropriate long-term antibiotics.  Objective:  Vital signs in last 24 hours: Vitals:   07/01/17 0503 07/01/17 0533 07/01/17 1430 07/01/17 2125  BP:  (!) 146/92 126/64 (!) 161/77  Pulse: 82 79 78 77  Resp:   13 13  Temp: (!) 97.5 F (36.4 C)  98.3 F (36.8 C) 98.2 F (36.8 C)  TempSrc: Oral  Oral Oral  SpO2: 100%  100% 100%  Weight:      Height:       GEN: Resting in bed, NAD CV: NR & RR, 2/6 systolic murmur best heard at RUSB PULM: CTAB, no wheezes or rales EXT: Bilateral heel booties. Trace LE edema. Moving bilateral upper extremities against gravity, unable to press call button for RN due to weakness.  PSYCH: Depressed mood, improved affect. Conversational.  SKIN: Sacral wound not assessed this morning (See photos under media tab from 5/1)  Assessment/Plan:  Debra [name] is a 52yo female with PMH of MSresulting inneurogenic bladder (with chronic indwelling foley), type 1 diabetes, HTN, CKDIII, chronic sacral wound infection,and depression who presented with sepsis and DKA, requiring intubation and ICU admission initially. DKA has resolved and she is currently being treated for methicillin resistant CoNS  bacteremia. Her hospital course is complicated by dysphagia / poor PO intake and what seems to be catatonic depression vs fatigue 2/2 MS. Mood has improved with initiation of modafinil and restarting home meds. Patient amenable to Psych consult today and will discuss with ID regarding plans for future antibiotics.  Methicillin Resistant CoNS Bacteremia, likely 2/2 PICC line from 04/2017 admission which has been removed Sacral wound infection She remains afebrile. Repeat BCx from 5/2 show NGTD x72h. ID has been consulted and she is currently on daptomycin and meropenem (day 7). Now that repeat BCx negative x72h, can likely replace PICC and discuss long-term antibiotics with ID. - ID consulted, appreciate their assistance - Wound care recommendations appreciated - Per surgery, no further debridement at this time - Continue IV daptomycin 500mg  q24h and IV meropenem 1g q8h (4/30 -> present) - Monitoring CK while on daptomycin - Follow up repeat BCx 5/2 -> NGTD x3d - New PICC once blood Cx are negative x 72 hours - Monitor fever curve - D/c Oxy IR 10mg  q6h PRN for sacral pain - Start IV dilaudid 0.5 mg q8h PRN for severe pain  Multiple Sclerosis (?primary progressive) Diagnosed in 10/2016. At baseline, she has bilateral LE weakness, neurogenic bladder requiring chronic indwelling foley, and fecal incontinence. She has had a complicated course since her diagnosis, requiring multiple re-hospitalizations. She started rituximab infusions for maintenance in Nov 2018 and was intended to follow up with Neurology (Dr. Epimenio Foot) as an outpatient for Rituximab infusions in Dec 2018, however this was not  completed. She was recently seen by Dr. Epimenio Foot on 4/17, at which time he recommended not continuing rituximab due to her active infection at the time (was being treated for sacral osteomyelitis). She was transitioned to teriflunomide due its lower immunosuppressive effect. He also recommended considering modafinil or  armodafinil if for MS lethargy if needed. Modafinil was started yesterday 5/4.  - Holding home teriflunomide in the setting of bacteremia  - Continue modafinil 100mg  daily for fatigue 2/2 MS - D/c gabapentin 300 mg TID as not helpful  Hx of depression with psychotic features Affect significantly improved today. Endorsing depression & feelings of hopelessness. Reports no prior medications have helped, only improvement in her clinical condition that improve her mood. She is agreeable to a psych consult today. Home Remeron and Risperdal were restarted 5/4. - Psych consulted; appreciate their recommendations  - Continue home mirtazapine 45mg  QHS - Continue home risperidone 1mg  QHS  Poor PO intake, improving Likely secondary to severe depression / MS lethargy. Tolerating PO intake, but continues to have poor appetite due to food being bland. She had ice cream and macaroni&cheese yesterday. - CM diet - Continue home mirtazapine - Continue modafinil 100mg  daily for fatigue 2/2 MS, may need uptitration - Treatment of depression and MS, as above  Hypertension Home medications include amlodipine, lisinopril, and metoprolol which have all been held since admission 2/2 sepsis. BP this morning is 153/79 - Continue monitoring her BP, can add on home medications if needed  AKI, resolved Neurogenic Bladder, with chronic indwelling foley - Continue home Bethanecol and Flomax - I/O monitoring  Hx of type 1 diabetes Labile BG Poor oral intake Initially presented with DKA that was treated & resolved. Then became hypoglycemic and placed on D5-1/2NS. D5-1/2 NS was stopped due to hyperglycemia & she was given 10 units of novolog. BG have been difficult to control in the setting of poor intake and infection. Home medications are Lantus 6 units and Novolog sliding scale. Hypoglycemic to 25 today without receiving Novolog. Did get 6 units Lantus last night. - Decrease to Lantus 4 units QHS - SSI-S TID -  Continue feeding supplements - Continue home mirtazapine 45mg  daily  Dispo: Anticipated discharge pending management of her bacteremia, PICC line placement for long term antibiotics, and psych evaluation today.   Scherrie Gerlach, MD 07/02/2017, 6:52 AM Pager: [phone]

## 2017-07-02 NOTE — Care Management Note (Signed)
Case Management Note  Patient Details  Name: Debra [name] MRN: [id number] Date of Birth: February 24, 1966  Subjective/Objective:                    Action/Plan:  SW following. Patient from SNF . Medicaid no LTAC benefit   Expected Discharge Date:                  Expected Discharge Plan:  Skilled Nursing Facility  In-House Referral:  Clinical Social Work  Discharge planning Services     Post Acute Care Choice:    Choice offered to:     DME Arranged:    DME Agency:     HH Arranged:    HH Agency:     Status of Service:  In process, will continue to follow  If discussed at Long Length of Stay Meetings, dates discussed:    Additional Comments:  Kingsley Plan, RN 07/02/2017, 3:26 PM

## 2017-07-02 NOTE — Progress Notes (Signed)
  Speech Language Pathology Treatment: Dysphagia  Patient Details Name: Debra [name] MRN: [id number] DOB: 1965-04-24 Today's Date: 07/02/2017 Time: 8882-8003 SLP Time Calculation (min) (ACUTE ONLY): 15 min  Assessment / Plan / Recommendation Clinical Impression  Pt was able to tolerate more upright positioning today although her solid intake was limited due to lack of appetite. Pt ate half her sandwich on her lunch tray today (diet advanced by MD to unrestricted textures) and denied any difficulty. No overt signs of aspiration were noted with trials this afternoon, although mastication may be mildly prolonged. Pt however describes having difficulty clearing food from her mouth after she has orally prepared her boluses. She describes this mostly with regular textures, like rice, dry foods. She does still like to eat some regular textures, like vegetables. Recommend continuing with current diet but SLP provided education about swallowing strategies, avoiding difficult foods, and the option for softening her diet if she has persistent difficulty. Will continue to follow acutely.   HPI HPI: 52 y/o female, resident of SNF, was brought to the ER with sepsis.  She has baseline multiple sclerosis, neurogenic bladder, frequent UTI's.  She has a large sacral wound and recent history of osteomyelitis.  ETT 4/30-5/01.  PO diet initiated -  per notes 5/2, pt refusing to eat, pocketing, not swallowing, hence SLP swallow eval ordered.  Pt was seen by SLP services in February of this year, ultimately cleared for a regular diet with thin liquids.        SLP Plan  Continue with current plan of care       Recommendations  Diet recommendations: Regular;Thin liquid Liquids provided via: Cup;Straw Medication Administration: Whole meds with liquid Supervision: Staff to assist with self feeding;Intermittent supervision to cue for compensatory strategies Compensations: Slow rate;Small sips/bites Postural  Changes and/or Swallow Maneuvers: Seated upright 90 degrees                Oral Care Recommendations: Oral care BID Follow up Recommendations: None SLP Visit Diagnosis: Dysphagia, unspecified (R13.10) Plan: Continue with current plan of care       GO                Maxcine Ham 07/02/2017, 5:05 PM  Maxcine Ham, M.A. CCC-SLP [phone]

## 2017-07-02 NOTE — Consult Note (Signed)
[facility]  Date of Admission:  06/26/2017           Day 6 daptomycin         Day 6 meropenem         ASSESSMENT: Debra [name] is a 52 y.o. female with PMH of MS, neurogenic bladder with chronic indwelling foley catheter, and recent admission in 04/2017 for VRE/Proteus bacteremia and sacral osteomyelitis as well as abscesses. Debra [name] was admitted 4/30 due to sepsis and found to have MSSA bacteremia as well as abscesses in R ischial anal fossa. Debra [name] was started on daptomycin and meropenem. Repeat Bcx negative at 4 days.   PLAN: 1. Continue daptomycin and meropenem for 1 more week (total of 2 weeks) 2. Follow up CK while on daptomycin  3. Changing PICC line today (5/6)  4. Will need long term antibiotics. Would switch to PO levaquin (given penicillin allergy) x 3-6 months after Debra [name] finishes 2 week sof IV antibiotics  Active Problems:   AKI (acute kidney injury) (HCC)   Diabetic acidosis (HCC)   Uncontrolled type 1 diabetes mellitus with diabetic peripheral neuropathy (HCC)   Major depressive disorder, single episode, severe without psychosis (HCC)   Decubitus ulcer of coccygeal region, stage IV (HCC)   Severe sepsis (HCC)   Staphylococcus epidermidis sepsis (HCC)   PICC line infection, subsequent encounter   Scheduled Meds: . bethanechol  50 mg Oral TID  . chlorhexidine  15 mL Mouth Rinse BID  . collagenase   Topical Daily  . feeding supplement  1 Container Oral BID BM  . feeding supplement (ENSURE ENLIVE)  237 mL Oral BID BM  . heparin  5,000 Units Subcutaneous Q8H  . insulin aspart  0-9 Units Subcutaneous TID WC  . insulin glargine  4 Units Subcutaneous QHS  . mouth rinse  15 mL Mouth Rinse q12n4p  . mirtazapine  45 mg Oral QHS  . modafinil  100 mg Oral Daily  . risperiDONE  1 mg Oral QHS  . tamsulosin  0.4 mg Oral Daily   Continuous Infusions: . DAPTOmycin (CUBICIN)  IV Stopped (07/01/17 1316)  . meropenem (MERREM) IV Stopped (07/02/17 9528)     PRN Meds:.HYDROmorphone (DILAUDID) injection, ondansetron (ZOFRAN) IV   SUBJECTIVE: Afebrile and hemodynamically stable. Leukocytosis resolved. Seemed in better spirits this mornign. Complaining of lower back and bilateral LE pain.   Review of Systems: Review of Systems  Constitutional: Negative for chills and fever.  Respiratory: Negative for cough and shortness of breath.   Cardiovascular: Negative for chest pain and palpitations.  Musculoskeletal: Positive for back pain, joint pain and myalgias.  Neurological: Positive for weakness. Negative for dizziness and headaches.    Allergies  Allergen Reactions  . Penicillins Anaphylaxis, Nausea And Vomiting and Rash    Has patient had a PCN reaction causing immediate rash, facial/tongue/throat swelling, SOB or lightheadedness with hypotension: Yes Has patient had a PCN reaction causing severe rash involving mucus membranes or skin necrosis: Yes Has patient had a PCN reaction that required hospitalization No Has patient had a PCN reaction occurring within the last 10 years: Yes If all of the above answers are "NO", then may proceed with Cephalosporin use.   . Lactose Intolerance (Gi) Other (See Comments)  . Pollen Extract Other (See Comments)    Seasonal Allergies  . Tape Rash    OBJECTIVE: Vitals:   07/01/17 0533 07/01/17 1430 07/01/17 2125 07/02/17 0630  BP: (!) 146/92 126/64 Marland Kitchen)  161/77 (!) 153/79  Pulse: 79 78 77 76  Resp:  13 13 13   Temp:  98.3 F (36.8 C) 98.2 F (36.8 C) 97.6 F (36.4 C)  TempSrc:  Oral Oral Oral  SpO2:  100% 100% 100%  Weight:      Height:       Body mass index is 22.96 kg/m.  Physical Exam  Constitutional: Debra [name] is oriented to person, place, and time.  Chronically-ill appearing female who appears older than stated age resting in bed in no acute distress   HENT:  Mouth/Throat: Oropharynx is clear and moist.  Eyes: Pupils are equal, round, and reactive to light. Conjunctivae are normal.  Neck:  Normal range of motion. Neck supple.  Cardiovascular: Normal rate and regular rhythm. Exam reveals no gallop and no friction rub.  Murmur (2/6 SEM best heard at RUSB ) heard. Pulmonary/Chest: Breath sounds normal. No respiratory distress. Debra [name] has no wheezes. Debra [name] has no rales.  Abdominal: Soft. Bowel sounds are normal. Debra [name] exhibits no distension. There is no tenderness.  Musculoskeletal: Debra [name] exhibits tenderness. Debra [name] exhibits no edema or deformity.  Chronic bilateral lower extremity weakness   Lymphadenopathy:    Debra [name] has no cervical adenopathy.  Neurological: Debra [name] is alert and oriented to person, place, and time.    Lab Results Lab Results  Component Value Date   WBC 10.4 07/01/2017   HGB 13.4 07/01/2017   HCT 40.1 07/01/2017   MCV 85.1 07/01/2017   PLT 209 07/01/2017    Lab Results  Component Value Date   CREATININE 0.79 07/01/2017   BUN 9 07/01/2017   NA 137 07/01/2017   K 3.6 07/01/2017   CL 104 07/01/2017   CO2 25 07/01/2017    Lab Results  Component Value Date   ALT 12 (L) 06/26/2017   AST 33 06/26/2017   ALKPHOS 179 (H) 06/26/2017   BILITOT 2.2 (H) 06/26/2017     Microbiology: Recent Results (from the past 240 hour(s))  Blood Culture (routine x 2)     Status: Abnormal   Collection Time: 06/26/17  8:08 AM  Result Value Ref Range Status   Specimen Description BLOOD RIGHT ANTECUBITAL  Final   Special Requests   Final    BOTTLES DRAWN AEROBIC AND ANAEROBIC Blood Culture adequate volume   Culture  Setup Time   Final    IN BOTH AEROBIC AND ANAEROBIC BOTTLES GRAM POSITIVE COCCI CRITICAL RESULT CALLED TO, READ BACK BY AND VERIFIED WITHMerlene Morse PHARMD 06/27/17 0606 JDW Performed at [facility], [address], [place], [place] [address]    Culture STAPHYLOCOCCUS SPECIES (COAGULASE NEGATIVE) (A)  Final   Report Status 06/30/2017 FINAL  Final   Organism ID, Bacteria STAPHYLOCOCCUS SPECIES (COAGULASE NEGATIVE)  Final      Susceptibility   Staphylococcus species  (coagulase negative) - MIC*    CIPROFLOXACIN 4 RESISTANT Resistant     ERYTHROMYCIN <=0.25 SENSITIVE Sensitive     GENTAMICIN 8 INTERMEDIATE Intermediate     OXACILLIN >=4 RESISTANT Resistant     TETRACYCLINE 2 SENSITIVE Sensitive     VANCOMYCIN 2 SENSITIVE Sensitive     TRIMETH/SULFA 160 RESISTANT Resistant     CLINDAMYCIN <=0.25 SENSITIVE Sensitive     RIFAMPIN <=0.5 SENSITIVE Sensitive     Inducible Clindamycin NEGATIVE Sensitive     * STAPHYLOCOCCUS SPECIES (COAGULASE NEGATIVE)  Blood Culture ID Panel (Reflexed)     Status: Abnormal   Collection Time: 06/26/17  8:08 AM  Result Value Ref Range  Status   Enterococcus species NOT DETECTED NOT DETECTED Final   Listeria monocytogenes NOT DETECTED NOT DETECTED Final   Staphylococcus species DETECTED (A) NOT DETECTED Final    Comment: Methicillin (oxacillin) resistant coagulase negative staphylococcus. Possible blood culture contaminant (unless isolated from more than one blood culture draw or clinical case suggests pathogenicity). No antibiotic treatment is indicated for blood  culture contaminants. CRITICAL RESULT CALLED TO, READ BACK BY AND VERIFIED WITH: V BRYK PHARMD 06/27/17 0606 JDW    Staphylococcus aureus NOT DETECTED NOT DETECTED Final   Methicillin resistance DETECTED (A) NOT DETECTED Final    Comment: CRITICAL RESULT CALLED TO, READ BACK BY AND VERIFIED WITH: V BRYK PHARMD 06/27/17 0606 JDW    Streptococcus species NOT DETECTED NOT DETECTED Final   Streptococcus agalactiae NOT DETECTED NOT DETECTED Final   Streptococcus pneumoniae NOT DETECTED NOT DETECTED Final   Streptococcus pyogenes NOT DETECTED NOT DETECTED Final   Acinetobacter baumannii NOT DETECTED NOT DETECTED Final   Enterobacteriaceae species NOT DETECTED NOT DETECTED Final   Enterobacter cloacae complex NOT DETECTED NOT DETECTED Final   Escherichia coli NOT DETECTED NOT DETECTED Final   Klebsiella oxytoca NOT DETECTED NOT DETECTED Final   Klebsiella pneumoniae NOT  DETECTED NOT DETECTED Final   Proteus species NOT DETECTED NOT DETECTED Final   Serratia marcescens NOT DETECTED NOT DETECTED Final   Haemophilus influenzae NOT DETECTED NOT DETECTED Final   Neisseria meningitidis NOT DETECTED NOT DETECTED Final   Pseudomonas aeruginosa NOT DETECTED NOT DETECTED Final   Candida albicans NOT DETECTED NOT DETECTED Final   Candida glabrata NOT DETECTED NOT DETECTED Final   Candida krusei NOT DETECTED NOT DETECTED Final   Candida parapsilosis NOT DETECTED NOT DETECTED Final   Candida tropicalis NOT DETECTED NOT DETECTED Final    Comment: Performed at [facility], [address], [place], [place] [address]  Blood Culture (routine x 2)     Status: Abnormal   Collection Time: 06/26/17  9:40 AM  Result Value Ref Range Status   Specimen Description BLOOD LEFT ANTECUBITAL  Final   Special Requests   Final    BOTTLES DRAWN AEROBIC AND ANAEROBIC Blood Culture adequate volume   Culture  Setup Time   Final    AEROBIC BOTTLE ONLY GRAM POSITIVE COCCI CRITICAL VALUE NOTED.  VALUE IS CONSISTENT WITH PREVIOUSLY REPORTED AND CALLED VALUE.    Culture (A)  Final    STAPHYLOCOCCUS SPECIES (COAGULASE NEGATIVE) SUSCEPTIBILITIES PERFORMED ON PREVIOUS CULTURE WITHIN THE LAST 5 DAYS. Performed at [facility], [address], [place], [place] [address]    Report Status 06/30/2017 FINAL  Final  Urine culture     Status: None   Collection Time: 06/26/17 10:30 AM  Result Value Ref Range Status   Specimen Description URINE, CATHETERIZED  Final   Special Requests NONE  Final   Culture   Final    NO GROWTH Performed at [facility], [address], [place], [place] [address]    Report Status 06/27/2017 FINAL  Final  MRSA PCR Screening     Status: None   Collection Time: 06/27/17  1:56 AM  Result Value Ref Range Status   MRSA by PCR NEGATIVE NEGATIVE Final    Comment:        The GeneXpert MRSA Assay (FDA approved for NASAL specimens only), is one  component of a comprehensive MRSA colonization surveillance program. It is not intended to diagnose MRSA infection nor to guide or monitor treatment  for MRSA infections. Performed at [facility], [address], [place], [place] [address]   Culture, respiratory (NON-Expectorated)     Status: None   Collection Time: 06/27/17  4:05 AM  Result Value Ref Range Status   Specimen Description TRACHEAL ASPIRATE  Final   Special Requests NONE  Final   Gram Stain   Final    MODERATE WBC PRESENT, PREDOMINANTLY PMN FEW YEAST    Culture   Final    FEW Consistent with normal respiratory flora. Performed at [facility], [address] [place], [place] [address]    Report Status 06/29/2017 FINAL  Final  Culture, blood (Routine X 2) w Reflex to ID Panel     Status: None (Preliminary result)   Collection Time: 06/28/17 12:42 PM  Result Value Ref Range Status   Specimen Description BLOOD RIGHT ANTECUBITAL  Final   Special Requests   Final    BOTTLES DRAWN AEROBIC ONLY Blood Culture results may not be optimal due to an excessive volume of blood received in culture bottles   Culture   Final    NO GROWTH 4 DAYS Performed at [facility], [address] [place], [place] [address]    Report Status PENDING  Incomplete    [name], MD [facility] [phone] pager   [phone] cell 07/02/2017, 11:20 AM

## 2017-07-02 NOTE — Telephone Encounter (Signed)
Spoke with pharmacy and explained pt. is starting Aubagio 14mg Debra [name]

## 2017-07-02 NOTE — Progress Notes (Signed)
Inpatient Diabetes Program Recommendations  AACE/ADA: New Consensus Statement on Inpatient Glycemic Control (2019)  Target Ranges:  Prepandial:   less than 140 mg/dL      Peak postprandial:   less than 180 mg/dL (1-2 hours)      Critically ill patients:  140 - 180 mg/dL   Results for ODESSER, PERLBERG (MRN [id number]) as of 07/02/2017 09:24  Ref. Range 07/01/2017 07:36 07/01/2017 11:52 07/01/2017 17:46 07/01/2017 21:23 07/02/2017 08:07 07/02/2017 08:45  Glucose-Capillary Latest Ref Range: 65 - 99 mg/dL 409 (H) 96 811 (H) 914 (H) 25 (LL) 96   Review of Glycemic Control  Diabetes history:DM1 (makes no insulin; requires basal, correction, and meal coverage insulin) Outpatient Diabetes medications:Lantus 18 units daily, Novolog 4-14 units TID with meals Current orders for Inpatient glycemic control: Lantus 6 units QHS, Novolog 0-9 units TID with meals   Inpatient Diabetes Program Recommendations:  Insulin - Basal: Noted fasting glucose 25 mg/dl this morning at 7:82 am. May want to consider changing basal insulin to Levemir 3 units BID (to start today at bedtime). HgbA1C: A1C 10.3% on 04/19/17 indicating poor glycemic control. Noted patient is from SNF. Likely needs to have outpatient DM medications adjusted.  NOTE: Patient received Lantus 6 units on 06/30/17 and glucose ranged from 96-127 mg/dl on 11/01/60. Then received Lantus 6 units last night on 07/01/17 and fasting glucose 25 mg/dl this morning. May want to consider changing basal insulin from Lantus to Levemir and changing to BID dosing.  Thanks, Orlando Penner, RN, MSN, CDE Diabetes Coordinator Inpatient Diabetes Program [phone] (Team Pager from 8am to 5pm)

## 2017-07-02 NOTE — Consult Note (Signed)
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   Encouraged patient to turn and reposition every two hours.  Refuses at this time preferring to lie on her back  Pressure Injury POA: Yes Measurement: Sacrum:  4 cm x 3.6 cm x 2 cm with adherent thin slough to wound bed.  Moderate serosanguinous drainage and musty odor.  Will begin topical antimicrobial therapy.  Wound bed:90% pale pink nongtranulating.  10% adherent slough to wound bed.  Drainage (amount, consistency, odor)Moderate serosanguinous  Musty odor.   Periwound:moisture associated skin damage to perineum and buttocks  Right > left Dressing procedure/placement/frequency: Cleanse sacral wound with NS.  Fill wound depth with Aquacel Ag for absorption.  [place] # [phone]).  Cover with ABD pad and tape.  Change Mon/Wed/Fri and PRN soilage.  Keep skin clean and dry. Turn every two hours.  Will not follow at this time.  Please re-consult if needed.  Maple Hudson RN BSN CWON Pager [phone]

## 2017-07-03 ENCOUNTER — Encounter (HOSPITAL_COMMUNITY): Payer: Self-pay | Admitting: General Practice

## 2017-07-03 ENCOUNTER — Inpatient Hospital Stay: Payer: Self-pay

## 2017-07-03 ENCOUNTER — Other Ambulatory Visit: Payer: Self-pay

## 2017-07-03 LAB — GLUCOSE, CAPILLARY
GLUCOSE-CAPILLARY: 175 mg/dL — AB (ref 65–99)
Glucose-Capillary: 255 mg/dL — ABNORMAL HIGH (ref 65–99)
Glucose-Capillary: 289 mg/dL — ABNORMAL HIGH (ref 65–99)
Glucose-Capillary: 103 mg/dL — ABNORMAL HIGH (ref 65–99)

## 2017-07-03 LAB — CULTURE, BLOOD (ROUTINE X 2): Culture: NO GROWTH

## 2017-07-03 MED ORDER — SODIUM CHLORIDE 0.9% FLUSH: 10.0000 mL | INTRAVENOUS | Status: DC | PRN

## 2017-07-03 MED ORDER — AMLODIPINE BESYLATE 10 MG PO TABS
10.0000 mg | ORAL_TABLET | Freq: Every day | ORAL | Status: DC
  Administered 2017-07-03 – 2017-07-05 (×3): 10 mg via ORAL
  Filled 2017-07-03 (×3): qty 1

## 2017-07-03 MED ORDER — TRAZODONE HCL 50 MG PO TABS
25.0000 mg | ORAL_TABLET | Freq: Every evening | ORAL | Status: DC | PRN
  Administered 2017-07-03: 25 mg via ORAL
  Filled 2017-07-03: qty 1

## 2017-07-03 MED ORDER — LISINOPRIL 20 MG PO TABS
20.0000 mg | ORAL_TABLET | Freq: Every day | ORAL | Status: DC
  Administered 2017-07-03 – 2017-07-05 (×3): 20 mg via ORAL
  Filled 2017-07-03 (×3): qty 1

## 2017-07-03 MED ORDER — ADULT MULTIVITAMIN W/MINERALS CH
1.0000 | ORAL_TABLET | Freq: Every day | ORAL | Status: DC
  Administered 2017-07-03 – 2017-07-05 (×3): 1 via ORAL
  Filled 2017-07-03 (×3): qty 1

## 2017-07-03 MED ORDER — DULOXETINE HCL 30 MG PO CPEP
30.0000 mg | ORAL_CAPSULE | Freq: Every day | ORAL | Status: DC
Start: 2017-07-03 — End: 2017-07-05
  Administered 2017-07-03 – 2017-07-05 (×3): 30 mg via ORAL
  Filled 2017-07-03 (×4): qty 1

## 2017-07-03 MED ORDER — BACLOFEN 10 MG PO TABS
5.0000 mg | ORAL_TABLET | Freq: Three times a day (TID) | ORAL | Status: DC
  Administered 2017-07-03 – 2017-07-05 (×6): 5 mg via ORAL
  Filled 2017-07-03 (×6): qty 1

## 2017-07-03 NOTE — Evaluation (Signed)
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 She was recently admitted for the same ~05/25/17.  Clinical Impression  Pt admitted with above diagnosis. Pt currently with functional limitations due to the deficits listed below (see PT Problem List). At the time of PT eval pt was able to perform transfers to/from EOB with up to +2 mod assist for balance support and safety. Pt tolerated sitting EOB for ~8 minutes but was limited due to pain in medial thighs due to edema. With dynamic sitting task, sitting balance decreased and increased support was provided at the shoulders. Pt anticipates d/c back to SNF. Acutely, pt will benefit from skilled PT to increase their independence and safety with mobility to allow discharge to the venue listed below.       Follow Up Recommendations SNF;Supervision/Assistance - 24 hour    Equipment Recommendations  None recommended by PT    Recommendations for Other Services       Precautions / Restrictions Precautions Precautions: Fall Restrictions Weight Bearing Restrictions: No      Mobility  Bed Mobility Overal bed mobility: Needs Assistance Bed Mobility: Rolling;Sidelying to Sit Rolling: Mod assist Sidelying to sit: Mod assist;+2 for physical assistance       General bed mobility comments: Rolling for pericare and linen change. Pt was able to initiate roll with UE reach across but required assist for LE's to follow. +2 assist required for transition to/from sitting EOB.  Transfers                 General transfer comment: NT - pt does not transfer at baseline  Ambulation/Gait                Stairs            Wheelchair Mobility    Modified Rankin (Stroke Patients Only)       Balance Overall balance assessment: Needs assistance Sitting-balance support: Feet supported;Bilateral upper extremity supported Sitting balance-Leahy Scale: Poor Sitting balance - Comments: Up to mod assist for static sitting EOB.                                      Pertinent Vitals/Pain Pain Assessment: Faces Faces Pain Scale: Hurts even more Pain Location: inner thighs in sitting - reports from edema Pain Descriptors / Indicators: Discomfort Pain Intervention(s): Limited activity within patient's tolerance;Monitored during session;Repositioned    Home Living Family/patient expects to be discharged to:: Skilled nursing facility                      Prior Function Level of Independence: Needs assistance   Gait / Transfers Assistance Needed: Pt reports she is not OOB at all at her nursing facility and rarely sits EOB.   ADL's / Homemaking Assistance Needed: Most recently has been receiving significant assist from SNF staff for ADL at bed level. Pt reports she occasionally requires assist with self feeding as well        Hand Dominance   Dominant Hand: Right    Extremity/Trunk Assessment   Upper Extremity Assessment Upper Extremity Assessment: Defer to OT  evaluation    Lower Extremity Assessment Lower Extremity Assessment: RLE deficits/detail;LLE deficits/detail RLE Deficits / Details: Bilaterally, pt with very tight hamstrings and a flexion contracture at the knees. Pt reports she cannot feel her lower legs/feet during light touch testing in sitting.     Cervical / Trunk Assessment Cervical / Trunk Assessment: Other exceptions Cervical / Trunk Exceptions: forward head/rounded shoulder posture  Communication   Communication: Expressive difficulties(delayed responses and soft voice difficult to hear)  Cognition Arousal/Alertness: Awake/alert Behavior During  Therapy: Flat affect Overall Cognitive Status: No family/caregiver present to determine baseline cognitive functioning                                 General Comments: Slow to respond at times      General Comments      Exercises Other Exercises Other Exercises: Bilateral hamstring stretch 2x30"   Assessment/Plan    PT Assessment Patient needs continued PT services  PT Problem List Decreased strength;Decreased range of motion;Decreased activity tolerance;Decreased balance;Decreased mobility;Decreased safety awareness;Decreased knowledge of precautions;Pain;Decreased skin integrity;Impaired sensation       PT Treatment Interventions DME instruction;Gait training;Stair training;Functional mobility training;Therapeutic activities;Therapeutic exercise;Neuromuscular re-education;Patient/family education    PT Goals (Current goals can be found in the Care Plan section)  Acute Rehab PT Goals Patient Stated Goal: Decrease pain PT Goal Formulation: With patient Time For Goal Achievement: 07/17/17 Potential to Achieve Goals: Fair    Frequency Min 2X/week   Barriers to discharge        Co-evaluation PT/OT/SLP Co-Evaluation/Treatment: Yes Reason for Co-Treatment: For patient/therapist safety;Complexity of the patient's impairments (multi-system involvement);To address functional/ADL transfers PT goals addressed during session: Mobility/safety with mobility;Balance;Strengthening/ROM         AM-PAC PT "6 Clicks" Daily Activity  Outcome Measure Difficulty turning over in bed (including adjusting bedclothes, sheets and blankets)?: Unable Difficulty moving from lying on back to sitting on the side of the bed? : Unable Difficulty sitting down on and standing up from a chair with arms (e.g., wheelchair, bedside commode, etc,.)?: Unable Help needed moving to and from a bed to chair (including a wheelchair)?: A Lot Help needed walking in hospital room?: Total Help needed  climbing 3-5 steps with a railing? : Total 6 Click Score: 7    End of Session   Activity Tolerance: Patient limited by pain Patient left: in bed;with call bell/phone within reach Nurse Communication: Mobility status;Need for lift equipment PT Visit Diagnosis: Pain;Muscle weakness (generalized) (M62.81) Pain - part of body: Leg    Time: 0814-4818 PT Time Calculation (min) (ACUTE ONLY): 30 min   Charges:   PT Evaluation $PT Eval Moderate Complexity: 1 Mod     PT G Codes:        Conni Slipper, PT, DPT Acute Rehabilitation Services Pager: [phone]   Marylynn Pearson 07/03/2017, 2:51 PM

## 2017-07-03 NOTE — Social Work (Signed)
CSW spoke with Algie Coffer of [facility], pt PASSR under Level II manual review. Verlon Au will meet with pt today to review for PASSR approval. Phone number for Algie Coffer is [phone].   Pt has no other LTC placement offers. Is able to discharge back to [facility] when medically appropriate and PASSR has been received.  Doy Hutching, LCSWA [facility] Clinical Social Work [phone]

## 2017-07-03 NOTE — Progress Notes (Signed)
Peripherally Inserted Central Catheter/Midline Placement  The IV Nurse has discussed with the patient and/or persons authorized to consent for the patient, the purpose of this procedure and the potential benefits and risks involved with this procedure.  The benefits include less needle sticks, lab draws from the catheter, and the patient may be discharged home with the catheter. Risks include, but not limited to, infection, bleeding, blood clot (thrombus formation), and puncture of an artery; nerve damage and irregular heartbeat and possibility to perform a PICC exchange if needed/ordered by physician.  Alternatives to this procedure were also discussed.  Bard Power PICC patient education guide, fact sheet on infection prevention and patient information card has been provided to patient /or left at bedside.    PICC/Midline Placement Documentation  PICC Single Lumen 07/03/17 PICC Right Cephalic 34 cm 1 cm (Active)  Indication for Insertion or Continuance of Line Home intravenous therapies (PICC only) 07/03/2017 10:55 PM  Exposed Catheter (cm) 1 cm 07/03/2017 10:55 PM  Site Assessment Clean;Dry;Intact 07/03/2017 10:55 PM  Line Status Saline locked;Flushed;Blood return noted 07/03/2017 10:55 PM  Dressing Type Transparent 07/03/2017 10:55 PM  Dressing Status Clean;Dry;Intact;Antimicrobial disc in place 07/03/2017 10:55 PM  Dressing Change Due 07/10/17 07/03/2017 10:55 PM       Tamir Wallman, Lajean Manes 07/03/2017, 10:56 PM

## 2017-07-03 NOTE — Progress Notes (Signed)
Nutrition Follow-up  DOCUMENTATION CODES:   Not applicable  INTERVENTION:   -Continue Ensure Enlive po BID, each supplement provides 350 kcal and 20 grams of protein -Continue Boost Breeze po BID, each supplement provides 250 kcal and 9 grams of protein -MVI daily -If pt continues to desire aggressive care, consider initiation of nutrition support:  Initiate Jevity 1.2 @ 25 ml/hr and increase by 10 ml every 4 hours to goal rate of 65 ml/hr.   Tube feeding regimen provides 1872 kcal (100% of needs), 87 grams of protein, and 1259 ml of H2O.   NUTRITION DIAGNOSIS:   Increased nutrient needs related to wound healing, chronic illness as evidenced by estimated needs.  Ongoing  GOAL:   Patient will meet greater than or equal to 90% of their needs  Unmet  MONITOR:   PO intake, Supplement acceptance, Labs, Weight trends, Skin, I & O's  REASON FOR ASSESSMENT:   Consult, Ventilator Enteral/tube feeding initiation and management  ASSESSMENT:   52 y.o. F admitted from SNF on 06/26/17 for DKA with altered mental status, hypotension, severe sepsis, chronic sacral wound, acute respiratory failure, acute metabolic encephalopathy, AKI. PMH of osteomyelitis and multiple sclerosis, anemia, HTN, hyperlipidemia, anxitety/depression with psychotic symptoms, sepsis due to UTI 03/2017, T1DM, stroke, arthritis, chronic back pain,  Neuropathy of hands and feet, and asthma.   5/1- extubated 5/2- transferred from ICU to surgical floor 5/3- s/p BSE- advanced to full liquid diet 5/5- advanced to carb modified diet  Pt in with therapies at time of visit.   Intake remains poor; now 5-50% meal completion. Per MAR, pt is now refusing supplements.   Pt amenable to palliative care consult; will await completion of consult for goals of care. Due to continued poor oral intake and increased needs for wound healing, pt would benefit from nutrition support (enteral nutrition) if aggressive care is desired.    Labs reviewed: CBGS: [id number] (inpatient orders for glycemic control are 0-9 units insulin aspart TID with meals and 4 units insulin glargine q HS).   Diet Order:   Diet Order           Diet Carb Modified Fluid consistency: Thin; Room service appropriate? Yes  Diet effective now          EDUCATION NEEDS:   Not appropriate for education at this time  Skin:  Skin Assessment: Skin Integrity Issues: Skin Integrity Issues:: Unstageable, Stage I, Other (Comment) Stage I: L foot, R heel Unstageable: sacrum Other: MASD perineum and rt buttock  Last BM:  07/02/17  Height:   Ht Readings from Last 1 Encounters:  06/26/17 5\' 5"  (1.651 m)    Weight:   Wt Readings from Last 1 Encounters:  07/01/17 138 lb (62.6 kg)    Ideal Body Weight:  56.82 kg  BMI:  Body mass index is 22.96 kg/m.  Estimated Nutritional Needs:   Kcal:  1700-1900  Protein:  85-100 grams  Fluid:  > 1.7 L    Amity Roes A. Mayford Knife, RD, LDN, CDE Pager: [phone] After hours Pager: [phone]

## 2017-07-03 NOTE — Consult Note (Signed)
Consultation Note Date: 07/03/2017   Patient Name: Debra [name]  DOB: [birth year]  MRN: [id number]  Age / Sex: 52 y.o., female  PCP: Aldine Contes, MD Referring Physician: Oval Linsey, MD  Reason for Consultation: Establishing goals of care, Non pain symptom management, Pain control and Psychosocial/spiritual support  HPI/Patient Profile: 52 y.o. female  with past medical history of MS (resulting in neurogenic bladder, loss of use of BLE, weak BUE, painful lower extremities, dysphagia), depression, osteomyelitis of coccyx (s/p surgical debridement 05/21/17- surg reconsult recommends continuing IV antibiotics), aortic atherosclerosis, DM, recurrent UTI's with sepsis  admitted on 06/26/2017 with hyperglycemia, AMS, hypotension, hypothermic. Workup revealed sepsis and DKA. She was intubated on admission - extubated 5/1. Sepsis infection is suspected to be from PICC?- PICC removed- Blood cx negative growth to date, urine culture- negative growth to date.  Sepsis now thought to be from sacral wound. Palliative medicine consulted for assistance with GOC and symptom management.   Clinical Assessment and Goals of Care:  I have reviewed medical records including EPIC notes, labs and imaging, assessed the patient and then met at the bedside along with Debra [name]  to discuss diagnosis prognosis, GOC, EOL wishes, disposition and options.  Prior to meeting with Debra [name] I also called her HCPOA- Debra [name]-  is unavailable to meet until Thursday for [place], but agreed to palliative provider seeing Debra [name] to discuss symptoms. Debra [name] has HCPOA paperwork in chart with specific instructions written that state "I DO WANT TO BE KEPT ALIVE ON LIFE SUPPORT, I DO WANT A FEEDING TUBE" She has also checked the box that states that her HCPOA may override her instructions.   I met with Debra [name] and introduced Palliative Medicine as specialized  medical care for people living with serious illness. It focuses on providing relief from the symptoms and stress of a serious illness. The goal is to improve quality of life for both the patient and the family.  We discussed a brief life review of the patient. She used to enjoy cooking. She is currently living at [facility]. She doesn't enjoy it much. She says she has family, but they don't visit her.   As far as functional and nutritional status- she has lost the ability to walk. She is chair/bed bound. She can hardly use her upper extremities. She has difficulty swallowing, but can still eat and drink. She has loss of appetite. She feels hungry, but when the food is in front of her she gets nauseated and loses the desire to eat. While visiting she received a plate with broccolli and the smell made her nausea. She notes she has lost weight.   Debra [name] worries about running out of money to pay for her care. She also worries about living the rest of her life in pain. She hopes to have her pain controlled. At this point in time her main concern is controlling her pain.  She describes pain in both her legs that is a constant, burning pain. It is relieved with dilaudid. She says before this admission the  pain was controlled. She stated that when she was at the facility her pain was not as bad as it has been during this admission.   Debra [name] is experiencing a great deal of grief over the loss of her independence and the ability to care for herself with the progression of her MS. She was tearful during our meeting. She acknowledges symptoms of depression with emotional lability, anhedonia, insomnia. She is able to fall asleep, but cannot stay asleep. She has anxiety about her pain and the continued progression of her illness.   We briefly touched on her Advanced Directives. She confirmed that Debra [name] is her HCPOA. I reviewed her instructions of wanting feeding tube and life support and she confirmed- she was not  ready to engage in further discussion regarding these topics- and her HCPOA would need to be present for further discussion as well.   Primary Decision Maker HCPOA and patient   SUMMARY OF RECOMMENDATIONS -Primary team has done excellent job of looking at patient as whole and managing symptoms- I have just a few suggestions-  -Trazadone 12.97m prn qhs for insomnia (will help counter effects of morning modafinil)   -Would consider restarting dc'd Neurontin at a lower dose (1026mTID) to address what sounds like neuropathic pain (maybe from DM in addition to MS?) due to patient reporting pain was controlled at facility-  -We discussed transitioning to a long acting opioid- however, on chart review she is only using an oral morphine 24hr equivalent of 7.55m78mShe has only requested one dose of hydromorphone in the last 24 hours.   -Therefore, I would recommend NOT starting a long acting opioid- rather- try transitioning to prn oral hydromorphone- 2mg76mhr prn. The addition of the cymbalta (which may take a few weeks to reach full effect), and modafinil, and baclofen are excellent and would continue those.   -Monitor bowels- we discussed opioids cause constipation- she reports ongoing issues with diarrhea so she is hopeful these will reverse that. Will hold off on bowel regimen for now- but need to be aggressive if she has one day of a missed BM.   Code Status/Advance Care Planning:  Full code  Palliative Prophylaxis:   Frequent Pain Assessment  Prognosis:    Unable to determine  Discharge Planning: To Be Determined  Primary Diagnoses: Present on Admission: . Severe sepsis (HCC)[place]Diabetic acidosis (HCC)[place] (acute kidney injury) (HCC)[place]aphylococcus epidermidis sepsis (HCC)[place]Uncontrolled type 1 diabetes mellitus with diabetic peripheral neuropathy (HCC)[place]Major depressive disorder, single episode, severe without psychosis (HCC)[place]kDecubitus ulcer of coccygeal region, stage IV  (HCC)[place]Major depressive disorder, recurrent episode, moderate (HCC)[place] CenterI have reviewed the medical record, interviewed the patient and family, and examined the patient. The following aspects are pertinent.  Past Medical History:  Diagnosis Date  . Adenomatous colonic polyps   . Anemia    2005  . Anxiety    1990  . Arthritis    "knees, hands" (05/07/2017)  . Asthma    2000  . Benign essential HTN   . Cataract   . Chronic lower back pain   . Decubital ulcer   . Depression with psychotic symptoms   . Diabetic ketoacidosis without coma associated with type 1 diabetes mellitus (HCC)[place]. Difficult intubation    narrow airway  . Fibromyalgia 11/07/2016  . Gastroesophageal Reflux Disease (GERD)   . Heart murmur    Birth  . Hyperlipidemia    2005  .  Hypertension    1998  . Internal hemorrhoids 04/24/06   on colonoscopy  . Migraine    "used to have them all the time; now maybe q6 months" (05/07/2017)  . Multiple sclerosis ([place])   . Neuropathic bladder   . Neuropathy of the hands & feet   . Restless Leg Syndrome   . Sepsis due to urinary tract infection ([place]) 04/12/2017  . Sleep paralysis   . Stable angina pectoris    2007: cath showing normal cors.   . Stroke 1990   denies residual on 05/07/2017  . Type I Diabetes Mellitus 1988   Social History   Socioeconomic History  . Marital status: Divorced    Spouse name: Not on file  . Number of children: 2  . Years of education: 79  . Highest education level: Not on file  Occupational History  . Occupation: Unemployed  Social Needs  . Financial resource strain: Not on file  . Food insecurity:    Worry: Not on file    Inability: Not on file  . Transportation needs:    Medical: Not on file    Non-medical: Not on file  Tobacco Use  . Smoking status: Never Smoker  . Smokeless tobacco: Never Used  Substance and Sexual Activity  . Alcohol use: No    Alcohol/week: 0.0 oz  . Drug use: No    Comment: drug addict  . Sexual  activity: Not Currently    Birth control/protection: Other-see comments  Lifestyle  . Physical activity:    Days per week: Not on file    Minutes per session: Not on file  . Stress: Not on file  Relationships  . Social connections:    Talks on phone: Not on file    Gets together: Not on file    Attends religious service: Not on file    Active member of club or organization: Not on file    Attends meetings of clubs or organizations: Not on file    Relationship status: Not on file  Other Topics Concern  . Not on file  Social History Narrative   Lost medicaid about 2009 ish when her youngest child turned 69. Has adult children. Lives with boyfriend who financially supports her. Has attempted to get disability but has been turned down.      2-3 caffeine drinks daily    Family History  Problem Relation Age of Onset  . Hypertension Mother   . Kidney disease Mother   . Hypertension Father   . Breast cancer Maternal Grandmother   . Prostate cancer Maternal Grandfather   . Ovarian cancer Paternal Grandmother   . Prostate cancer Paternal Grandfather   . Colon cancer Maternal Uncle        Family history of malignant neoplasm of gastrointestinal tract   Scheduled Meds: . amLODipine  10 mg Oral Daily  . baclofen  5 mg Oral TID  . bethanechol  50 mg Oral TID  . chlorhexidine  15 mL Mouth Rinse BID  . collagenase   Topical Daily  . DULoxetine  30 mg Oral Daily  . feeding supplement  1 Container Oral BID BM  . feeding supplement (ENSURE ENLIVE)  237 mL Oral BID BM  . heparin  5,000 Units Subcutaneous Q8H  . insulin aspart  0-9 Units Subcutaneous TID WC  . insulin glargine  4 Units Subcutaneous QHS  . lisinopril  20 mg Oral Daily  . mouth rinse  15 mL Mouth Rinse q12n4p  .  mirtazapine  45 mg Oral QHS  . modafinil  100 mg Oral Daily  . multivitamin with minerals  1 tablet Oral Daily  . tamsulosin  0.4 mg Oral Daily   Continuous Infusions: . DAPTOmycin (CUBICIN)  IV Stopped  (07/03/17 1507)  . meropenem (MERREM) IV Stopped (07/03/17 1351)   PRN Meds:.HYDROmorphone (DILAUDID) injection, ondansetron (ZOFRAN) IV Medications Prior to Admission:  Prior to Admission medications   Medication Sig Start Date End Date Taking? Authorizing Provider  acetaminophen (TYLENOL) 325 MG tablet Take 650 mg by mouth every 4 (four) hours as needed for mild pain or fever.    Yes [provider]  amLODipine (NORVASC) 10 MG tablet Take 1 tablet (10 mg total) by mouth daily. 02/14/17 02/14/18 Yes Katherine Roan, MD  aspirin EC 81 MG tablet Take 1 tablet (81 mg total) by mouth daily. 02/14/17  Yes Katherine Roan, MD  atorvastatin (LIPITOR) 40 MG tablet Take 1 tablet (40 mg total) by mouth daily. 02/14/17  Yes Katherine Roan, MD  Baclofen 5 MG TABS Take 5 mg by mouth 3 (three) times daily. 04/24/17  Yes Mariel Aloe, MD  bethanechol (URECHOLINE) 50 MG tablet Take 1 tablet (50 mg total) by mouth 3 (three) times daily. 02/14/17  Yes Winfrey, Jenne Pane, MD  GLUCAGON HCL, RDNA, IJ Inject 1 mg into the muscle as needed (hypoglycemia-administer for CBG <60).   Yes [provider]  insulin aspart (NOVOLOG) 100 UNIT/ML injection Inject 4-14 Units into the skin 3 (three) times daily before meals. 150-200=4 units 201-250=6 units 241-300=8 units 301-350=10 units 351-400=12 units 401-450=14 units notify PCP if >451   Yes [provider]  insulin glargine (LANTUS) 100 UNIT/ML injection Inject 0.06 mLs (6 Units total) into the skin daily. Patient taking differently: Inject 18 Units into the skin daily.  05/25/17  Yes Santos-Sanchez, Merlene Morse, MD  Lactobacillus (ACIDOPHILUS) 100 MG CAPS Take 100 mg by mouth 2 (two) times daily.   Yes [provider]  lidocaine (LIDODERM) 5 % Place 1 patch onto the skin daily. FOR LOWER BACK PAIN Remove & Discard patch within 12 hours or as directed by MD   Yes [provider]  lisinopril (PRINIVIL,ZESTRIL) 20 MG  tablet Take 1 tablet (20 mg total) by mouth daily. 02/15/17  Yes Katherine Roan, MD  loperamide (IMODIUM) 2 MG capsule Take 2 capsules (4 mg total) by mouth as needed for diarrhea or loose stools. 04/24/17  Yes Mariel Aloe, MD  magnesium hydroxide (MILK OF MAGNESIA) 400 MG/5ML suspension Take 30 mLs by mouth daily as needed for mild constipation. 02/14/17  Yes Katherine Roan, MD  meclizine (ANTIVERT) 12.5 MG tablet Take 12.5 mg by mouth 3 (three) times daily as needed for dizziness. per nsg. home MAR, pt. takes 1 tablet 1 hour prior to therapy.   Yes [provider]  metoprolol succinate (TOPROL-XL) 100 MG 24 hr tablet Take 1 tablet (100 mg total) by mouth daily. Take with or immediately following a meal. 02/15/17  Yes Winfrey, Jenne Pane, MD  mirtazapine (REMERON) 45 MG tablet Take 1 tablet (45 mg total) by mouth at bedtime. 04/24/17  Yes Mariel Aloe, MD  Multiple Vitamin (MULTIVITAMIN) tablet Take 1 tablet by mouth daily.   Yes [provider]  ondansetron (ZOFRAN) 4 MG tablet Take 4 mg by mouth daily.   Yes [provider]  Oxycodone HCl 10 MG TABS Take 1 tablet (10 mg total) by mouth every 6 (six) hours  as needed (PRN for post-op pain.). 05/25/17  Yes Zada Finders, MD  pantoprazole (PROTONIX) 40 MG tablet Take 1 tablet (40 mg total) by mouth daily. 02/15/17  Yes Katherine Roan, MD  polyethylene glycol (MIRALAX / GLYCOLAX) packet Take 17 g by mouth daily as needed for mild constipation, moderate constipation or severe constipation. 02/14/17  Yes Katherine Roan, MD  pregabalin (LYRICA) 25 MG capsule Take 25 mg by mouth every 8 (eight) hours.    Yes [provider]  risperiDONE (RISPERDAL) 1 MG tablet Take 1 tablet (1 mg total) at bedtime by mouth. 01/02/17  Yes Gouru, Illene Silver, MD  tamsulosin (FLOMAX) 0.4 MG CAPS capsule Take 1 capsule (0.4 mg total) daily by mouth. 01/03/17  Yes Gouru, Illene Silver, MD  Teriflunomide 14 MG TABS Take 14 mg by mouth daily.    Yes [provider]  valACYclovir (VALTREX) 500 MG tablet Take 500 mg by mouth 2 (two) times daily.   Yes [provider]  collagenase (SANTYL) ointment Apply topically 2 (two) times daily. Patient not taking: Reported on 06/26/2017 05/25/17   Zada Finders, MD  feeding supplement, GLUCERNA SHAKE, (GLUCERNA SHAKE) LIQD Take 237 mLs by mouth 2 (two) times daily between meals. Patient not taking: Reported on 05/05/2017 02/14/17   Katherine Roan, MD  protein supplement shake (PREMIER PROTEIN) LIQD Take 325 mLs (11 oz total) by mouth 2 (two) times daily between meals. Patient not taking: Reported on 06/13/2017 04/24/17   Mariel Aloe, MD  Vitamin D, Ergocalciferol, (DRISDOL) 50000 units CAPS capsule Take 1 capsule (50,000 Units total) by mouth every 7 (seven) days. Patient taking differently: Take 50,000 Units by mouth every 7 (seven) days. On Thursdays 02/15/17   Katherine Roan, MD  zolpidem (AMBIEN) 5 MG tablet Take 1 tablet (5 mg total) by mouth at bedtime as needed for sleep. Patient not taking: Reported on 06/13/2017 04/24/17   Mariel Aloe, MD   Allergies  Allergen Reactions  . Penicillins Anaphylaxis, Nausea And Vomiting and Rash    Has patient had a PCN reaction causing immediate rash, facial/tongue/throat swelling, SOB or lightheadedness with hypotension: Yes Has patient had a PCN reaction causing severe rash involving mucus membranes or skin necrosis: Yes Has patient had a PCN reaction that required hospitalization No Has patient had a PCN reaction occurring within the last 10 years: Yes If all of the above answers are "NO", then may proceed with Cephalosporin use.   . Lactose Intolerance (Gi) Other (See Comments)  . Pollen Extract Other (See Comments)    Seasonal Allergies  . Tape Rash   Review of Systems  Constitutional: Positive for activity change and fatigue.  Respiratory: Negative for shortness of breath.   Gastrointestinal: Positive for diarrhea.  Negative for abdominal pain.  Neurological: Positive for weakness.    Physical Exam  Constitutional: She is oriented to person, place, and time. She appears well-developed.  frail  HENT:  Head: Normocephalic and atraumatic.  Cardiovascular: Normal rate and regular rhythm.  Pulmonary/Chest: Effort normal.  Abdominal: Soft.  Neurological: She is alert and oriented to person, place, and time.  Psychiatric:  Depressed [name]  Nursing note and vitals reviewed.   Vital Signs: BP (!) 169/88 (BP Location: Right Arm)   Pulse 80   Temp 98.7 F (37.1 C) (Oral)   Resp 14   Ht _0  (1.651 m)   Wt 62.6 kg (138 lb)   SpO2 100%   BMI 22.96 kg/m  Pain Scale: 0-10  Pain Score: Asleep   SpO2: SpO2: 100 % O2 Device:SpO2: 100 % O2 Flow Rate: .O2 Flow Rate (L/min): 4 L/min  IO: Intake/output summary:   Intake/Output Summary (Last 24 hours) at 07/03/2017 1700 Last data filed at 07/03/2017 0954 Gross per 24 hour  Intake 940 ml  Output 300 ml  Net 640 ml    LBM: Last BM Date: 07/02/17 Baseline Weight: Weight: 57.2 kg (126 lb) Most recent weight: Weight: 62.6 kg (138 lb)     Palliative Assessment/Data: PPS: 30%     Thank you for this consult. Palliative medicine will continue to follow and assist as needed.   Time In: 1545 Time Out: 1700 Time Total: 75 mins Greater than 50%  of this time was spent counseling and coordinating care related to the above assessment and plan.  Signed by: Mariana Kaufman, AGNP-C Palliative Medicine    Please contact Palliative Medicine Team phone at [phone] for questions and concerns.  For individual provider: See Shea Evans

## 2017-07-03 NOTE — Progress Notes (Addendum)
Subjective:  Patient was resting comfortably in bed this morning. She reports the dilaudid PRN was helpful for her pain. She denies subjective fever/chills. She reports dinner being "so-so". She is amenable to discussion with palliative today.  Objective:  Vital signs in last 24 hours: Vitals:   07/01/17 2125 07/02/17 0630 07/02/17 2203 07/03/17 0508  BP: (!) 161/77 (!) 153/79 (!) 144/64 (!) 168/88  Pulse: 77 76 80 88  Resp: 13 13 10 16   Temp: 98.2 F (36.8 C) 97.6 F (36.4 C) 98.2 F (36.8 C) 99.1 F (37.3 C)  TempSrc: Oral Oral Oral Oral  SpO2: 100% 100% 100% 100%  Weight:      Height:       GEN: Resting in bed, NAD CV: NR & RR, 2/6 systolic murmur best heard at RUSB PULM: CTAB, no wheezes or rales EXT: Bilateral heel booties. Trace LE edema. PSYCH: Depressed mood, improved affect. Conversational.  SKIN: Sacral wound not assessed this morning (See photos under media tab from 5/1)  Assessment/Plan:  [name] is a 52yo female with PMH of MSresulting inneurogenic bladder (with chronic indwelling foley), type 1 diabetes, HTN, CKDIII, chronic sacral wound infection,and depression who presented with sepsis and DKA, requiring intubation and ICU admission initially. DKA has resolved and she is currently being treated for methicillin resistant CoNS bacteremia. Her hospital course is complicated by dysphagia / poor PO intake and what seems to be catatonic depression vs fatigue 2/2 MS. Mood has improved with initiation of modafinil and restarting home meds. Patient amenable to Palliative consult. Repeat BCx negative, will be able to get PICC line when medically stable for discharge.  Methicillin Resistant CoNS Bacteremia, likely 2/2 PICC line from 04/2017 admission which has been removed Sacral wound infection She remains afebrile. Repeat BCx from 5/2 show NGTD x4d. Currently on daptomycin and meropenem (day 8). Now that repeat BCx negative x72h, can replace PICC. ID recommends 2  weeks of IV daptomycin and meropenem and then transition to PO levofloxacin for at least 3-6 months - ID consulted, appreciate their assistance - Wound care recommendations appreciated - Per surgery, no further debridement at this time - Continue IV daptomycin 500mg  q24h and IV meropenem 1g q8h x2 weeks total (4/30 -> present) - Will transition to PO levofloxacin (PCN allergy) after 2 weeks of IV antibiotics - Monitoring CK while on daptomycin - Follow up repeat BCx 5/2 -> NGTD x4d - Monitor fever curve - Continue IV dilaudid 0.5 mg q8h PRN for severe pain - Aunt concerned about the care patient is receiving at [facility] facility - SW consulted and working on facility options for discharge - PICC line ordered  Multiple Sclerosis (?primary progressive) Diagnosed in 10/2016. At baseline, she has bilateral LE weakness, neurogenic bladder requiring chronic indwelling foley, and fecal incontinence. She has had a complicated course since her diagnosis, requiring multiple re-hospitalizations. She started rituximab infusions for maintenance in Nov 2018 and was intended to follow up with Neurology (Dr. Epimenio Foot) as an outpatient for Rituximab infusions in Dec 2018, however this was not completed. She was recently seen by Dr. Epimenio Foot on 4/17, at which time he recommended not continuing rituximab due to her active infection at the time (was being treated for sacral osteomyelitis). She was transitioned to teriflunomide due its lower immunosuppressive effect. Started modafinil with some improvement in lethargy. - Holding home teriflunomide in the setting of bacteremia  - Continue modafinil 100mg  daily for fatigue 2/2 MS - Palliative consult today - Sit up as tolerated -  PT/OT eval ordered  Hx of depression with psychotic features Affect significantly improved today. Psych consulted who recommend d/c risperdal and starting cymbalta. - Psych consulted; appreciate their recommendations  - Continue home mirtazapine  45mg  QHS - D/c home risperidone 1mg  QHS - Start duloxetine 30mg  daily  Poor PO intake, improving Likely secondary to severe depression / MS lethargy. Tolerating PO intake, but continues to have poor appetite due to food being bland. She reports dinner being "so-so". - CM diet - Continue home mirtazapine - Continue modafinil 100mg  daily for fatigue 2/2 MS, may need uptitration - Treatment of depression and MS, as above  Hypertension Home medications include amlodipine, lisinopril, and metoprolol which have all been held since admission 2/2 sepsis. BP this morning is 168/88 - Continue monitoring her BP, can add on home medications if needed  AKI, resolved Neurogenic Bladder, with chronic indwelling foley - Continue home Bethanecol and Flomax - I/O monitoring  Hx of type 1 diabetes Labile BG Poor oral intake Initially presented with DKA that was treated & resolved. Then became hypoglycemic and placed on D5-1/2NS. D5-1/2 NS was stopped due to hyperglycemia & she was given 10 units of novolog. BG have been difficult to control in the setting of poor intake and infection. Home medications are Lantus 6 units and Novolog sliding scale. Decreased to Lantus 4u due to hypoglycemia. BG currently stable. - Continue Lantus 4u QHS - SSI-S TID - Continue feeding supplements - Continue home mirtazapine 45mg  daily  Dispo: Anticipated discharge pending management of her bacteremia and PICC line placement for long term antibiotics.  Scherrie Gerlach, MD 07/03/2017, 7:00 AM Pager: [phone]  Internal Medicine Attending  Date: 07/03/2017  Patient name: Debra [name] Medical record number: [id number] Date of birth: 30-Sep-1965 Age: 52 y.o. Gender: female  I saw and evaluated the patient. I reviewed the resident's note by Dr. Renaldo Reel and I agree with the resident's findings and plans as documented in her progress note.  PICC line, palliative care, and placement are the remaining issues which  are being worked on.

## 2017-07-03 NOTE — Progress Notes (Signed)
MD made aware of current BP

## 2017-07-03 NOTE — Evaluation (Signed)
Occupational Therapy Evaluation Patient Details Name: Debra [name] MRN: [id number] DOB: 08-04-1965 Today's Date: 07/03/2017    History of Present Illness  is a 52 y/o female with PMH of diabetes, MS resulting in neurogenic bladder (with chronic indwelling foley), HTN, CKD III, recurrent UTIs and depression who presents from her nursing facility with elevated blood sugar. She was recently admitted for the same ~05/25/17.   Clinical Impression   PTA, pt has been at [facility]. She is able to feed herself at times and required assistance for other ADL. Pt presenting with significantly limited BUE AROM/PROM as well as poor motor control of B hands impacting her ability to safely feed herself and limiting functional positioning. She currently requires min guard assist for grooming tasks, min assist for self-feeding tasks, mod assist for UB ADL and total assist for other ADL participation. Pt able to sit at EOB for ADL and preparatory activity participation for approximately 8 minutes today. She was limited by sacral wound pain. Pt would benefit from trial of OT services while admitted to maximize participation in self-feeding and grooming tasks along with functional mobility and positioning. Will continue to follow while admitted.     Follow Up Recommendations  SNF;Supervision/Assistance - 24 hour    Equipment Recommendations  None recommended by OT    Recommendations for Other Services       Precautions / Restrictions Precautions Precautions: Fall Restrictions Weight Bearing Restrictions: No      Mobility Bed Mobility Overal bed mobility: Needs Assistance Bed Mobility: Rolling;Sidelying to Sit Rolling: Mod assist Sidelying to sit: Mod assist;+2 for physical assistance       General bed mobility comments: Rolling for pericare and linen change. Pt was able to initiate roll with UE reach across but required assist for LE's to follow. +2 assist required for transition to/from sitting  EOB.  Transfers                 General transfer comment: NT - pt does not transfer at baseline    Balance Overall balance assessment: Needs assistance Sitting-balance support: Feet supported;Bilateral upper extremity supported Sitting balance-Leahy Scale: Poor Sitting balance - Comments: Up to mod assist for static sitting EOB.                                    ADL either performed or assessed with clinical judgement   ADL Overall ADL's : Needs assistance/impaired Eating/Feeding: Minimal assistance;Bed level   Grooming: Set up;Sitting Grooming Details (indicate cue type and reason): increased time and encouragement to continue Upper Body Bathing: Moderate assistance;Bed level   Lower Body Bathing: Total assistance;Bed level   Upper Body Dressing : Moderate assistance;Bed level   Lower Body Dressing: Total assistance;Bed level                 General ADL Comments: Pt limited in her ability to participate in ADL. Decreased dexterity limiting self-feeding abilities. Pt with very tight shoulders limiting AROM/PROM     Vision         Perception     Praxis      Pertinent Vitals/Pain Pain Assessment: Faces Faces Pain Scale: Hurts even more Pain Location: inner thighs in sitting - reports from edema Pain Descriptors / Indicators: Discomfort Pain Intervention(s): Limited activity within patient's tolerance;Monitored during session;Repositioned     Hand Dominance Right   Extremity/Trunk Assessment Upper Extremity Assessment Upper Extremity Assessment: RUE deficits/detail;LUE  deficits/detail RUE Deficits / Details: Able to achieve approximately 0-100 degrees shoulder flexion and 0-90 degrees abduction passively and ~0-70 actively. Limited grasp strength and decreased ability to manipulate utensils.  Very tight throughout.  LUE Deficits / Details: Able to achieve approximately 0-90 degrees shoulder flexion and abduction passively and ~0-70  actively. Decreased grasp strength and limited ability to manipulate utensils. Very tight throughout.    Lower Extremity Assessment Lower Extremity Assessment: Defer to PT evaluation RLE Deficits / Details: Bilaterally, pt with very tight hamstrings and a flexion contracture at the knees. Pt reports she cannot feel her lower legs/feet during light touch testing in sitting.    Cervical / Trunk Assessment Cervical / Trunk Assessment: Other exceptions Cervical / Trunk Exceptions: forward head/rounded shoulder posture   Communication Communication Communication: Expressive difficulties(delayed responses and soft voice difficult to hear)   Cognition Arousal/Alertness: Awake/alert Behavior During Therapy: Flat affect Overall Cognitive Status: No family/caregiver present to determine baseline cognitive functioning                                 General Comments: Slow to respond at times   General Comments       Exercises Exercises: Other exercises Other Exercises Other Exercises: Bilateral hamstring stretch 2x30"   Shoulder Instructions      Home Living Family/patient expects to be discharged to:: Skilled nursing facility                                        Prior Functioning/Environment Level of Independence: Needs assistance  Gait / Transfers Assistance Needed: Pt reports she is not OOB at all at her nursing facility and rarely sits EOB.  ADL's / Homemaking Assistance Needed: Able to feed herself "some of the time" but assistance for other ADL.             OT Problem List: Decreased strength;Decreased range of motion;Decreased activity tolerance;Impaired balance (sitting and/or standing);Decreased safety awareness;Decreased knowledge of use of DME or AE;Decreased knowledge of precautions;Pain      OT Treatment/Interventions: Self-care/ADL training;Therapeutic exercise;Energy conservation;DME and/or AE instruction;Therapeutic  activities;Patient/family education;Balance training    OT Goals(Current goals can be found in the care plan section) Acute Rehab OT Goals Patient Stated Goal: Decrease pain OT Goal Formulation: With patient Time For Goal Achievement: 07/17/17 Potential to Achieve Goals: Good ADL Goals Pt Will Perform Eating: with set-up;sitting Pt Will Perform Grooming: with set-up;sitting Pt/caregiver will Perform Home Exercise Program: Increased strength;Increased ROM;Both right and left upper extremity;With written HEP provided;Independently  OT Frequency: Min 2X/week   Barriers to D/C:            Co-evaluation PT/OT/SLP Co-Evaluation/Treatment: Yes Reason for Co-Treatment: For patient/therapist safety;Complexity of the patient's impairments (multi-system involvement);To address functional/ADL transfers PT goals addressed during session: Mobility/safety with mobility;Balance;Strengthening/ROM OT goals addressed during session: ADL's and self-care;Strengthening/ROM      AM-PAC PT "6 Clicks" Daily Activity     Outcome Measure Help from another person eating meals?: A Little Help from another person taking care of personal grooming?: A Little Help from another person toileting, which includes using toliet, bedpan, or urinal?: Total Help from another person bathing (including washing, rinsing, drying)?: Total Help from another person to put on and taking off regular upper body clothing?: Total Help from another person to put on and taking off regular  lower body clothing?: Total 6 Click Score: 10   End of Session Nurse Communication: Mobility status  Activity Tolerance: Patient limited by pain Patient left: in bed;with call bell/phone within reach  OT Visit Diagnosis: Other abnormalities of gait and mobility (R26.89);Pain Pain - Right/Left: (bilateral) Pain - part of body: Leg(leg, sacrum)                Time: 1610-9604 OT Time Calculation (min): 17 min Charges:  OT General Charges $OT  Visit: 1 Visit OT Evaluation $OT Eval Moderate Complexity: 1 Mod G-Codes:     Doristine Section, MS OTR/L  Pager: [phone]   Carnell Beavers A Garnett Nunziata 07/03/2017, 3:12 PM

## 2017-07-04 DIAGNOSIS — F4323 Adjustment disorder with mixed anxiety and depressed mood: Secondary | ICD-10-CM

## 2017-07-04 DIAGNOSIS — Z7189 Other specified counseling: Secondary | ICD-10-CM

## 2017-07-04 DIAGNOSIS — Z515 Encounter for palliative care: Secondary | ICD-10-CM

## 2017-07-04 DIAGNOSIS — G35D Multiple sclerosis, unspecified: Secondary | ICD-10-CM

## 2017-07-04 DIAGNOSIS — G35 Multiple sclerosis: Secondary | ICD-10-CM

## 2017-07-04 LAB — BASIC METABOLIC PANEL
Anion gap: 8 (ref 5–15)
BUN: 6 mg/dL (ref 6–20)
CHLORIDE: 105 mmol/L (ref 101–111)
CO2: 23 mmol/L (ref 22–32)
Calcium: 8 mg/dL — ABNORMAL LOW (ref 8.9–10.3)
Creatinine, Ser: 0.85 mg/dL (ref 0.44–1.00)
GFR calc non Af Amer: >60 mL/min (ref >60)
Glucose, Bld: 357 mg/dL — ABNORMAL HIGH (ref 65–99)
POTASSIUM: 3.5 mmol/L (ref 3.5–5.1)
SODIUM: 136 mmol/L (ref 135–145)

## 2017-07-04 LAB — GLUCOSE, CAPILLARY
GLUCOSE-CAPILLARY: 361 mg/dL — AB (ref 65–99)
GLUCOSE-CAPILLARY: 207 mg/dL — AB (ref 65–99)
GLUCOSE-CAPILLARY: 267 mg/dL — AB (ref 65–99)
GLUCOSE-CAPILLARY: 188 mg/dL — AB (ref 65–99)

## 2017-07-04 MED ORDER — HYDROMORPHONE HCL 2 MG PO TABS
2.0000 mg | ORAL_TABLET | ORAL | Status: DC | PRN
  Administered 2017-07-04: 2 mg via ORAL
  Filled 2017-07-04: qty 1

## 2017-07-04 MED ORDER — HYDROMORPHONE HCL 2 MG/ML IJ SOLN: 0.5000 mg | Freq: Three times a day (TID) | INTRAMUSCULAR | Status: DC | PRN

## 2017-07-04 MED ORDER — HYDROMORPHONE HCL 2 MG PO TABS
1.0000 mg | ORAL_TABLET | ORAL | Status: DC | PRN
  Administered 2017-07-04 – 2017-07-05 (×3): 1 mg via ORAL
  Filled 2017-07-04 (×3): qty 1

## 2017-07-04 MED ORDER — PRO-STAT SUGAR FREE PO LIQD
30.0000 mL | Freq: Three times a day (TID) | ORAL | Status: DC
  Administered 2017-07-04 – 2017-07-05 (×3): 30 mL via ORAL
  Filled 2017-07-04 (×3): qty 30

## 2017-07-04 MED ORDER — PREGABALIN 25 MG PO CAPS
25.0000 mg | ORAL_CAPSULE | Freq: Three times a day (TID) | ORAL | Status: DC
  Administered 2017-07-04 – 2017-07-05 (×3): 25 mg via ORAL
  Filled 2017-07-04 (×3): qty 1

## 2017-07-04 NOTE — Progress Notes (Signed)
Inpatient Diabetes Program Recommendations  AACE/ADA: New Consensus Statement on Inpatient Glycemic Control (2015)  Target Ranges:  Prepandial:   less than 140 mg/dL      Peak postprandial:   less than 180 mg/dL (1-2 hours)      Critically ill patients:  140 - 180 mg/dL   Lab Results  Component Value Date   GLUCAP 361 (H) 07/04/2017   HGBA1C 10.3 (H) 04/19/2017    Review of Glycemic Control Results for Debra [name], Debra [name] (MRN [id number]) as of 07/04/2017 11:49  Ref. Range 07/03/2017 17:49 07/03/2017 21:44 07/04/2017 08:15  Glucose-Capillary Latest Ref Range: 65 - 99 mg/dL 803 (H) 212 (H) 248 (H)   Diabetes history:DM1 (makes no insulin; requires basal, correction, and meal coverage insulin) Outpatient Diabetes medications:Lantus 18 units daily, Novolog 4-14 units TID with meals Current orders for Inpatient glycemic control: Lantus 4 units QHS, Novolog 0-9 units TID with meals    Inpatient Diabetes Program Recommendations:   May want to consider getting a 0200 am BS. Question if patient rebounding given AM FSBS on 07/02/17 was 25 mg/dL without insulin adjustments.   Could also consider custom correction?  Novolog Custom Correction Scale CBG <70 - implement hypoglycemia protocol   70-120- 0 121-150 - 0 151-200 - 1 201-250 - 2 251-300 - 3 301-350 - 4 351-400 - 5 >400- call MD  Thanks, Lujean Rave, MSN, RNC-OB Diabetes Coordinator [phone] (8a-5p)

## 2017-07-04 NOTE — Progress Notes (Signed)
Daily Progress Note   Patient Name: Debra [name]       Date: 07/04/2017 DOB: Jan 30, 1966  Age: 52 y.o. MRN#: [id number] Attending Physician: Doneen Poisson, MD Primary Care Physician: [name], MD Admit Date: 06/26/2017  Reason for Consultation/Follow-up: Establishing goals of care  Subjective: Patient lying in bed awake and alert. Debra [name] states she slept better after trazadone last night. She was noted to be a bit sleepy after administration of 2mg  po hydromorphone today.  Debra [name] was much more talkative and bright today. She smiled and laughed some.  We ventured into some GOC discussion. She brought up that she does think about the fact that she isn't going to walk again. She does make the statement that she doesn't want to live this way but she has to because its just the hand she has been dealt. She's not yet ready to set any limits on her care. She wishes to continue full aggressive care and full code status- as long as her pain can be controlled.  Debra [name] agrees to outpatient Palliative f/u when she returns to facility. I called her HCPOA-Debra [name]- and updated her on my conversations with [name]. Aram Beecham is also in agreement with continued Palliative followup outpatient at discharge.   Review of Systems  Constitutional: Positive for malaise/fatigue.  Gastrointestinal: Positive for nausea.    Length of Stay: 8  Current Medications: Scheduled Meds:  . amLODipine  10 mg Oral Daily  . baclofen  5 mg Oral TID  . bethanechol  50 mg Oral TID  . chlorhexidine  15 mL Mouth Rinse BID  . collagenase   Topical Daily  . DULoxetine  30 mg Oral Daily  . feeding supplement  1 Container Oral BID BM  . feeding supplement (PRO-STAT SUGAR FREE 64)  30 mL Oral TID BM  . heparin  5,000  Units Subcutaneous Q8H  . insulin aspart  0-9 Units Subcutaneous TID WC  . insulin glargine  4 Units Subcutaneous QHS  . lisinopril  20 mg Oral Daily  . mouth rinse  15 mL Mouth Rinse q12n4p  . mirtazapine  45 mg Oral QHS  . modafinil  100 mg Oral Daily  . multivitamin with minerals  1 tablet Oral Daily  . pregabalin  25 mg Oral Q8H  . tamsulosin  0.4 mg Oral Daily  Continuous Infusions: . DAPTOmycin (CUBICIN)  IV Stopped (07/04/17 1305)  . meropenem (MERREM) IV Stopped (07/04/17 1501)    PRN Meds: HYDROmorphone (DILAUDID) injection, HYDROmorphone, ondansetron (ZOFRAN) IV, sodium chloride flush, traZODone  Physical Exam  Constitutional: She is oriented to person, place, and time.  frail  Cardiovascular: Normal rate and regular rhythm.  Pulmonary/Chest: Effort normal.  Neurological: She is alert and oriented to person, place, and time.  Nursing note and vitals reviewed.           Vital Signs: BP (!) 154/77 (BP Location: Right Arm)   Pulse 81   Temp 98.4 F (36.9 C) (Oral)   Resp 16   Ht 5\' 5"  (1.651 m)   Wt 56.7 kg (125 lb)   SpO2 99%   BMI 20.80 kg/m  SpO2: SpO2: 99 % O2 Device: O2 Device: Room Air O2 Flow Rate: O2 Flow Rate (L/min): 4 L/min  Intake/output summary:   Intake/Output Summary (Last 24 hours) at 07/04/2017 1706 Last data filed at 07/04/2017 1431 Gross per 24 hour  Intake 967 ml  Output 0 ml  Net 967 ml   LBM: Last BM Date: 07/03/17 Baseline Weight: Weight: 57.2 kg (126 lb) Most recent weight: Weight: 56.7 kg (125 lb)       Palliative Assessment/Data: PPS; 30%      Patient Active Problem List   Diagnosis Date Noted  . Adjustment disorder with mixed anxiety and depressed mood   . MS (multiple sclerosis) (HCC)   . Palliative care by specialist   . Advance care planning   . Major depressive disorder, recurrent episode, moderate (HCC) 07/02/2017  . Staphylococcus epidermidis sepsis (HCC)   . PICC line infection, subsequent encounter   .  Severe sepsis (HCC) 06/26/2017  . Acute metabolic encephalopathy   . Acute respiratory failure (HCC)   . Acute pyelonephritis   . Normocytic anemia 05/25/2017  . Decubitus ulcer of coccygeal region, stage IV (HCC) 05/21/2017  . 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 Proteus infection 05/06/2017  . Foley catheter in place on admission 05/06/2017  . Hyperglycemia 05/05/2017  . Bacteriuria 05/05/2017  . Recurrent UTI   . Palliative care encounter   . Goals of care, counseling/discussion   . Pressure injury of skin 04/14/2017  . Aortic atherosclerosis (HCC) 03/14/2017  . Major depressive disorder, single episode, severe without psychosis (HCC) 01/02/2017  . Dysthymia 12/27/2016  . HTN (hypertension)   . Neurogenic bladder   . Labile blood pressure   . Leukocytosis   . Labile blood glucose   . Sleep disturbance   . Multiple sclerosis (HCC) 11/22/2016  . Thoracic root lesion 11/22/2016  . Neuropathic pain   . Leg weakness, bilateral 11/20/2016  . History of CVA (cerebrovascular accident)   . Uncontrolled type 1 diabetes mellitus with diabetic peripheral neuropathy (HCC)   . Diabetic acidosis (HCC) 11/15/2016  . Back pain 10/31/2016  . AKI (acute kidney injury) (HCC)   . Stable angina pectoris (HCC)   . Vitamin D deficiency 08/31/2014  . 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 Hyperlipidemia 10/06/2013  . Chronic diarrhea 10/19/2010  . Peripheral neuropathy 08/19/2010  . Type 1 diabetes mellitus (HCC) 07/06/2010  . Hypertension 07/06/2010  . Asthma 07/06/2010    Palliative Care Assessment & Plan   Patient Profile: 52 y.o. female  with past medical history of MS (resulting in neurogenic bladder, loss of use of BLE, weak BUE,  painful lower extremities, dysphagia), depression, osteomyelitis of coccyx (s/p surgical debridement 05/21/17- surg reconsult recommends continuing IV  antibiotics), aortic atherosclerosis, DM, recurrent UTI's with sepsis  admitted on 06/26/2017 with hyperglycemia, AMS, hypotension, hypothermic. Workup revealed sepsis and DKA. She was intubated on admission - extubated 5/1. Sepsis infection is suspected to be from PICC?- PICC removed- Blood cx negative growth to date, urine culture- negative growth to date.  Sepsis now thought to be from sacral wound. Palliative medicine consulted for assistance with GOC and symptom management.   Assessment/Recommendations/Plan   Continue current level of care  Please request outpatient Palliative referral in discharge summary so that Primary physician at facility can make referral once she is admitted there  Will decrease hydromorphone to 1mg  since lyrica being restarted and concern for sedation at 2mg  po  Goals of Care and Additional Recommendations:  Limitations on Scope of Treatment: Full Scope Treatment  Code Status:  Full code  Prognosis:   Unable to determine  Discharge Planning:  Skilled Nursing Facility for rehab with Palliative care service follow-up  Care plan was discussed with patient and HCPOA.  Thank you for allowing the Palliative Medicine Team to assist in the care of this patient.   Time In: 1600 Time Out: 1635 Total Time 35 mins Prolonged Time Billed no      Greater than 50%  of this time was spent counseling and coordinating care related to the above assessment and plan.  [name], AGNP-C Palliative Medicine   Please contact Palliative Medicine Team phone at [phone] for questions and concerns.

## 2017-07-04 NOTE — Progress Notes (Signed)
Subjective:  Debra [name] was lying in bed this morning. She had no new complaints this morning. She states that the dilaudid continues to help her leg pain.  Objective:  Vital signs in last 24 hours: Vitals:   07/03/17 1614 07/03/17 2147 07/04/17 0500 07/04/17 0533  BP: (!) 169/88 139/81  (!) 154/77  Pulse: 80 79  81  Resp:  18  16  Temp:  98.1 F (36.7 C)  98.4 F (36.9 C)  TempSrc:  Oral  Oral  SpO2: 100% 100%  99%  Weight:   125 lb (56.7 kg)   Height:       GEN: Resting in bed, NAD CV: NR & RR, 2/6 systolic murmur best heard at RUSB PULM: CTAB, no wheezes or rales EXT: Bilateral heel booties. Trace LE edema. PSYCH: Depressed mood, improved affect SKIN: Sacral wound not assessed this morning (See photos under media tab from 5/1)  Assessment/Plan:  Debra [name] is a 52yo female with PMH of MSresulting inneurogenic bladder (with chronic indwelling foley), type 1 diabetes, HTN, CKDIII, chronic sacral wound infection,and depression who presented with sepsis and DKA, requiring intubation and ICU admission initially. DKA has resolved and she is currently being treated for methicillin resistant CoNS bacteremia. Her hospital course is complicated by dysphagia / poor PO intake and what seems to be catatonic depression vs fatigue 2/2 MS. Mood has improved with initiation of modafinil. Palliative consulted and recommending addition of trazodone and PO dilaudid for pain. Repeat BCx negative. PICC line has been placed.  Methicillin Resistant CoNS Bacteremia, 2/2 PICC line from 04/2017 admission which has been removed Sacral wound infection She remains afebrile. Repeat BCx from 5/2 negative. Will continue on daptomycin and meropenem for a total of 2 weeks (5/2-5/16). PICC line is in place. Culture results from admission show resistance to fluoroquinolones. Discussed with ID, they recommend doxycycline 100mg  BID for at least 3-6 months after completion of IV antibiotics. - ID consulted,  appreciate their assistance - Wound care recommendations appreciated - Per surgery, no further debridement at this time - Continue IV daptomycin 500mg  q24h and IV meropenem 1g q8h (4/30 -> stop date 5/16) - Monitoring CK while on daptomycin - New PICC in place (5/7) - Monitor fever curve - Add PO dilaudid 2mg  q4h PRN for severe pain - Continue IV dilaudid 0.5 mg q8h PRN for breakthrough pain - Discussed with SW - no other LTAC beds available. Patient will return to the facility she came in from. Awaiting approval. Likely will be ready tomorrow  Multiple Sclerosis (?primary progressive) Diagnosed in 10/2016. At baseline, she has bilateral LE weakness, neurogenic bladder requiring chronic indwelling foley, and fecal incontinence. She has had a complicated course since her diagnosis, requiring multiple re-hospitalizations.She started rituximab infusions for maintenance in Nov 2018 and was intended to follow up with Neurology (Dr. Epimenio Foot) as an outpatient for Rituximab infusions in Dec 2018, however this was not completed. She was recently seen by Dr. Epimenio Foot on 4/17, at which time he recommended not continuing rituximab due to her active infection at the time (was being treated for sacral osteomyelitis). She was transitioned to teriflunomidedue its lower immunosuppressive effect. Started modafinil with some improvement in lethargy. - Palliative Medicine consulted; appreciate their assistance - Holding home teriflunomide in the setting of bacteremia  - Continue modafinil 100mg  daily for fatigue 2/2 MS - Continue trazodone 25mg  QHS as needed for insomnia - Restart home lyrica 75mg  TID - PT/OT eval  Hx of depression with psychotic features Affect  improving. Psych consulted who recommend d/c risperdal and starting cymbalta. - Psych consulted; appreciate their recommendations  - Continue home mirtazapine 45mg  QHS - Continue duloxetine 30mg  daily  Poor PO intake, improving Likely secondary to severe  depression / MS lethargy. Tolerating PO intake, but continues to have poor appetite due to food being bland and feeling nauseous. - CM diet - Continue home mirtazapine - Continue modafinil 100mg  daily for fatigue 2/2 MS, may need uptitration - Continue zofran 4mg  q6h PRN for nausea - Treatment of depression and MS, as above  Hypertension Home medications include amlodipine, lisinopril, and metoprolol which have all been held since admission2/2 sepsis. BP this morning is 154/77 - Restarted home lisinopril 20mg  daily and amlodipine 10mg  daily - Holding home metoprolol  AKI, resolved Neurogenic Bladder, with chronic indwelling foley - ContinuehomeBethanecol and Flomax - I/O monitoring  Hx of type 1 diabetes Labile BG Poor oral intake Initially presented with DKA that was treated & resolved. Then became hypoglycemic and placed on D5-1/2NS. D5-1/2 NS was stopped due to hyperglycemia & she was given 10 units of novolog. BGhave been difficult to control in the setting of poor intake and infection. Home medications are Lantus 6 units and Novolog sliding scale. Decreased to Lantus 4u due to hypoglycemia. BG currently elevated. Will check 2am BG. - Continue Lantus 4u QHS - SSI-S TID - Continue feeding supplements - Continue home mirtazapine 45mg  daily - CBG at 2AM  Dispo: Anticipated discharge pending placement - likely tomorrow  Scherrie Gerlach, MD 07/04/2017, 6:41 AM Pager: [phone]

## 2017-07-04 NOTE — Social Work (Signed)
Pt PASSR still under manual review for the state.  Doy Hutching, LCSWA [facility] Clinical Social Work [phone]

## 2017-07-04 NOTE — Progress Notes (Signed)
Nutrition Follow-up  DOCUMENTATION CODES:   Not applicable  INTERVENTION:   -D/c Ensure Enlive po BID, each supplement provides 350 kcal and 20 grams of protein -Continue Boost Breeze po BID, each supplement provides 250 kcal and 9 grams of protein -30 ml Prostat TID, each supplement provides 100 kcals and 15 grams protein -If pt continues to desire aggressive care, consider initiation of nutrition support:  Initiate Jevity 1.2 @ 25 ml/hr and increase by 10 ml every 4 hours to goal rate of 65 ml/hr.   Tube feeding regimen provides 1872 kcal (100% of needs), 87 grams of protein, and 1259 ml of H2O.   NUTRITION DIAGNOSIS:   Increased nutrient needs related to wound healing, chronic illness as evidenced by estimated needs.  Ongoing  GOAL:   Patient will meet greater than or equal to 90% of their needs  Unmet  MONITOR:   PO intake, Supplement acceptance, Labs, Weight trends, Skin, I & O's  REASON FOR ASSESSMENT:   Consult, Ventilator Enteral/tube feeding initiation and management  ASSESSMENT:   52 y.o. F admitted from SNF on 06/26/17 for DKA with altered mental status, hypotension, severe sepsis, chronic sacral wound, acute respiratory failure, acute metabolic encephalopathy, AKI. PMH of osteomyelitis and multiple sclerosis, anemia, HTN, hyperlipidemia, anxitety/depression with psychotic symptoms, sepsis due to UTI 03/2017, T1DM, stroke, arthritis, chronic back pain,  Neuropathy of hands and feet, and asthma.   5/1- extubated 5/2- transferred from ICU to surgical floor 5/3- s/p BSE- advanced to full liquid diet 5/5- advanced to carb modified diet  Case discussed with RN, who requested that this RD re-evaluate pt's supplements secondary to hyperglycemia. RN confirms that pt is eating very poorly (PO 5-25%). Pt will consume Boost Breeze, but is refusing Ensure. Of note, RN reported that blood sugar was checked just after pt consumed Boost Breeze supplement.   Attempted to  speak with pt, who is very somnolent. Also spoke with nutritional services staff, who is assisting pt with meal orders. Pt is a very selective eater and prefers to eat foods such as chocolate pudding.   Palliative care team following; reviewed most recent note, pt is unsure if she would desire a feeding tube. Due to continued poor oral intake and increased needs for wound healing, pt would benefit from nutrition support (enteral nutrition) if aggressive care is desired.   Per MD and CSW notes, pt will likely discharge back to SNF tomorrow (07/05/17).   Labs reviewed: CBGS: 103-361 (inpatient orders for glycemic control are 0-9 units insulin aspart TID with meals and 4 units insulin glargine q HS).   Diet Order:   Diet Order           Diet Carb Modified Fluid consistency: Thin; Room service appropriate? Yes  Diet effective now          EDUCATION NEEDS:   Not appropriate for education at this time  Skin:  Skin Assessment: Skin Integrity Issues: Skin Integrity Issues:: Unstageable, Stage I, Other (Comment) Stage I: L foot, R heel Unstageable: sacrum Other: MASD perineum and rt buttock  Last BM:  07/03/17  Height:   Ht Readings from Last 1 Encounters:  06/26/17 5\' 5"  (1.651 m)    Weight:   Wt Readings from Last 1 Encounters:  07/04/17 125 lb (56.7 kg)    Ideal Body Weight:  56.82 kg  BMI:  Body mass index is 20.8 kg/m.  Estimated Nutritional Needs:   Kcal:  1700-1900  Protein:  85-100 grams  Fluid:  >  1.7 L    Adriahna Shearman A. Jimmye Norman, RD, LDN, CDE Pager: [phone] After hours Pager: [phone]

## 2017-07-04 NOTE — Progress Notes (Signed)
SLP Cancellation Note  Patient Details Name: Carisma Schuck MRN: [id number] DOB: 21-Jun-1965   Cancelled treatment:       Reason Eval/Treat Not Completed: Patient declined, no reason specified - maintained eyes closed.  Will f/u next date   Carolan Shiver 07/04/2017, 11:38 AM

## 2017-07-05 DIAGNOSIS — Z91011 Allergy to milk products: Secondary | ICD-10-CM

## 2017-07-05 LAB — GLUCOSE, CAPILLARY
GLUCOSE-CAPILLARY: 220 mg/dL — AB (ref 65–99)
GLUCOSE-CAPILLARY: 229 mg/dL — AB (ref 65–99)
GLUCOSE-CAPILLARY: 129 mg/dL — AB (ref 65–99)

## 2017-07-05 MED ORDER — HEPARIN SOD (PORK) LOCK FLUSH 100 UNIT/ML IV SOLN
250.0000 [IU] | INTRAVENOUS | Status: AC | PRN
  Administered 2017-07-05: 250 [IU]

## 2017-07-05 MED ORDER — DULOXETINE HCL 30 MG PO CPEP: 30.0000 mg | ORAL_CAPSULE | Freq: Every day | ORAL | 3 refills | Status: DC

## 2017-07-05 MED ORDER — ONDANSETRON HCL 40 MG/20ML IJ SOLN
8.0000 mg | Freq: Three times a day (TID) | INTRAMUSCULAR | Status: DC | PRN
  Filled 2017-07-05: qty 4

## 2017-07-05 MED ORDER — PRO-STAT SUGAR FREE PO LIQD: 30.0000 mL | Freq: Three times a day (TID) | ORAL | 0 refills | Status: DC

## 2017-07-05 MED ORDER — DOXYCYCLINE HYCLATE 50 MG PO CAPS: 100.0000 mg | ORAL_CAPSULE | Freq: Two times a day (BID) | ORAL | 0 refills | Status: DC

## 2017-07-05 MED ORDER — SODIUM CHLORIDE 0.9 % IV SOLN: 500.0000 mg | INTRAVENOUS | 7 refills | Status: AC

## 2017-07-05 MED ORDER — SODIUM CHLORIDE 0.9 % IV SOLN: 8.0000 mg | Freq: Four times a day (QID) | INTRAVENOUS | Status: DC | PRN

## 2017-07-05 MED ORDER — HYDROMORPHONE HCL 2 MG PO TABS: 1.0000 mg | ORAL_TABLET | ORAL | 0 refills | Status: DC | PRN

## 2017-07-05 MED ORDER — MODAFINIL 100 MG PO TABS: 100.0000 mg | ORAL_TABLET | Freq: Every morning | ORAL | Status: DC

## 2017-07-05 MED ORDER — INSULIN GLARGINE 100 UNIT/ML ~~LOC~~ SOLN: 5.0000 [IU] | Freq: Every day | SUBCUTANEOUS | 11 refills | Status: DC

## 2017-07-05 MED ORDER — HYDROMORPHONE HCL 2 MG PO TABS: 1.0000 mg | ORAL_TABLET | ORAL | 0 refills | Status: AC | PRN

## 2017-07-05 MED ORDER — SODIUM CHLORIDE 0.9 % IV SOLN: 1.0000 g | Freq: Three times a day (TID) | INTRAVENOUS | 7 refills | Status: AC

## 2017-07-05 NOTE — Progress Notes (Signed)
SLP Cancellation Note  Patient Details Name: Debra [name] MRN: [id number] DOB: 02/28/1966   Cancelled treatment:       ReasonTreat Not Completed: Pt declined any POs today.  Intake continues to be limited; however, per discussion, record review, pt with improved toleration of POs.  No clinical symptoms of aspiration.  Lung sounds have been stable. Pt reports difficulty swallowing meats; has an easier time when chopped, as they are at [facility]. No further SLP involvement is warranted - our services will sign off at this time.    Blenda Mounts Laurice 07/05/2017, 12:27 PM

## 2017-07-05 NOTE — Clinical Social Work Placement (Signed)
   CLINICAL SOCIAL WORK PLACEMENT  NOTE [facility] [place])  Date:  07/05/2017  Patient Details  Name: Debra [name] MRN: [id number] Date of Birth: December 30, 1965  Clinical Social Work is seeking post-discharge placement for this patient at the Skilled  Nursing Facility level of care (*CSW will initial, date and re-position this form in  chart as items are completed):      Patient/family provided with [facility] Clinical Social Work Department's list of facilities offering this level of care within the geographic area requested by the patient (or if unable, by the patient's family).  Yes   Patient/family informed of their freedom to choose among providers that offer the needed level of care, that participate in Medicare, Medicaid or managed care program needed by the patient, have an available bed and are willing to accept the patient.      Patient/family informed of [place]'s ownership interest in [facility] and [facility], as well as of the fact that they are under no obligation to receive care at these facilities.  PASRR submitted to EDS on       PASRR number received on       Existing PASRR number confirmed on 06/29/17     FL2 transmitted to all facilities in geographic area requested by pt/family on 06/30/17     FL2 transmitted to all facilities within larger geographic area on       Patient informed that his/her managed care company has contracts with or will negotiate with certain facilities, including the following:        Yes   Patient/family informed of bed offers received.  Patient chooses bed at [facility]     Physician recommends and patient chooses bed at      Patient to be transferred to [facility] on 07/05/17.  Patient to be transferred to facility by [facility]     Patient family notified on 07/05/17 of transfer.  Name of family member notified:  aunt,  Aram Beecham      PHYSICIAN       Additional Comment:    _______________________________________________ Doy Hutching, LCSWA 07/05/2017, 2:04 PM

## 2017-07-05 NOTE — Progress Notes (Signed)
1500 Current foley is leaking urine at the urethra. Replaced it with a larger foley Fr18.  Dressings to sacrum and heels changed. PICC flushed and capped by the IV team. Report was given to Leanora Ivanoff at [facility].  1700 Pt is discharged to SNF via [facility].

## 2017-07-05 NOTE — Social Work (Signed)
[place] PASSR office has completed manual PASSR review: Pt PASSR is [id number].  Pt has picc and is able to discharge back to [facility], now [facility], no further facilities were able to offer LTC beds for pt.  CSW has paged medical team.  Doy Hutching, Theresia Majors [facility] Clinical Social Work [phone]

## 2017-07-05 NOTE — Progress Notes (Signed)
Subjective:  Debra [name] was lying in bed this morning. Debra [name] had no new complaints this morning. Debra [name] states the her leg pain is "okay". Not interested in breakfast due to nausea.  Objective:  Vital signs in last 24 hours: Vitals:   07/04/17 0533 07/04/17 1550 07/04/17 2256 07/05/17 0433  BP: (!) 154/77 (!) 104/58 128/62 (!) 155/76  Pulse: 81 82 80 72  Resp: 16  17 16   Temp: 98.4 F (36.9 C) (!) 97.4 F (36.3 C) 98 F (36.7 C) 97.7 F (36.5 C)  TempSrc: Oral Oral Oral Oral  SpO2: 99% 100% 99% 100%  Weight:    122 lb 9.2 oz (55.6 kg)  Height:       GEN: Resting in bed, NAD, appears fatigued, eyes closed CV: NR & RR, 2/6 systolic murmur best heard at RUSB PULM: CTAB, no wheezes or rales EXT: Bilateral heel booties. No LE edema. PSYCH: Depressed mood SKIN: Sacral wound not assessed this morning (See photos under media tab from 5/1)  Assessment/Plan:  Debra [name] is a 52yo female with PMH of MSresulting inneurogenic bladder (with chronic indwelling foley), type 1 diabetes, HTN, CKDIII, chronic sacral wound infection,and depression who presented with sepsis and DKA, requiring intubation and ICU admission initially. DKA has resolved and Debra [name] is currently being treated for methicillin resistant CoNS bacteremia. Her hospital course is complicated by dysphagia / poor PO intake and what seems to be catatonic depression vs fatigue 2/2 MS. Repeat BCx negative. PICC line has been placed.  Methicillin Resistant CoNS Bacteremia, 2/2 PICC line from 04/2017 admission which has been removed Sacral wound infection Debra [name] remains afebrile. Repeat BCx from 5/2 negative. Will continue on daptomycin and meropenem for a total of 2 weeks (5/2->stop date 5/16). New PICC line is in place. Discussed with ID, they recommend doxycycline 100mg  BID for at least 3-6 months after completion of IV antibiotics. - Wound care recommendations appreciated - Per surgery, no further debridement at this time - Continue IV  daptomycin 500mg  q24h and IV meropenem 1g q8h (4/30 -> stop date 5/16) - Monitoring CK while on daptomycin - New PICC in place (5/7) - Continue PO dilaudid 1mg  q4h PRN for severe pain - Continue IV dilaudid 0.5 mg q8h PRN for breakthrough pain - Discussed with SW - no other LTAC beds available. Debra [name] will return to the facility Debra [name] came in from. Awaiting approval. Likely will be ready today - F/u in IMTS clinic scheduled  Multiple Sclerosis (?primary progressive) At baseline, Debra [name] has bilateral LE weakness, neurogenic bladder requiring chronic indwelling foley, and fecal incontinence. Currently on teriflunomide instead of rituximab due to its lower immunosuppressive effect. Started modafinil with some improvement in lethargy. Debra [name] appears more fatigued after addition of dilaudid - will d/c trazodone to decrease sedating medications. - Palliative Medicine consulted; appreciate their assistance - Holding home teriflunomide in the setting of bacteremia  - Continue modafinil 100mg  daily for fatigue 2/2 MS, may need uptitration as outpatient - D/c trazodone 12.5mg  QHS as needed for insomnia - Continue lyrica 25mg  TID - Continue home baclofen 5mg  TID for spasms - PT/OT eval - Will f/u with Palliative as outpatient  Hx of depression with psychotic features Affect improving. Psych consulted who recommend d/c risperdal and starting cymbalta. - Continue home mirtazapine 45mg  QHS - Continue duloxetine 30mg  daily  Poor PO intake, improving Likely secondary to severe depression / MS lethargy. Tolerating PO intake, but continues to have poor appetite due to food being bland and feeling nauseous. Possibly  a component of gastroparesis 2/2 diabetes? Will continue treating for poor PO intake due to severe depression, however this may be something to consider working up as an outpatient if PO intake continues not to improve. May need feeding tube in the future if PO nutrition continues to remain poor. -  CM diet - Continue home mirtazapine - Continue modafinil 100mg  daily for fatigue 2/2 MS, may need uptitration - Change to zofran 8mg  q8h PRN for nausea - Treatment of depression and MS, as above  Hypertension Home medications include amlodipine, lisinopril, and metoprolol which have all been held since admission2/2 sepsis. BP this morning is 155/76 - Continue home lisinopril 20mg  daily and amlodipine 10mg  daily - Holding home metoprolol  AKI, resolved Neurogenic Bladder, with chronic indwelling foley RN informed us foley was leaking. Will attempt to get larger foley. - ContinuehomeBethanecol and Flomax - I/O monitoring  Hx of type 1 diabetes Labile BG Poor oral intake BGhave been difficult to control in the setting of poor intake and infection. Currently on Lantus 4u QHS with BG currently elevated. 2am BG elevated, not suggestive of Somogyi phenomenon. - Continue Lantus 4u QHS - SSI-S TID - Continue feeding supplements - Continue home mirtazapine 45mg  daily  Dispo: Anticipated discharge pending approval for SNF placement - likely today  Scherrie Gerlach, MD 07/05/2017, 7:59 AM Pager: [phone]

## 2017-07-05 NOTE — Progress Notes (Signed)
Occupational Therapy Treatment Patient Details Name: Debra [name] MRN: [id number] DOB: 01-30-1966 Today's Date: 07/05/2017    History of present illness  is a 52 y/o female with PMH of diabetes, MS resulting in neurogenic bladder (with chronic indwelling foley), HTN, CKD III, recurrent UTIs and depression who presents from her nursing facility with elevated blood sugar. She was recently admitted for the same ~05/25/17.   OT comments  Pt making slow progress with functional goals. Pt c/o nausea initially and the after sitting EOB x 5 minutes. Educated pt extensively on the importance of OOB activity and risks of new pressure ulcers as well as existing sacral pressure ulcer. Pt encouraged to eat as much of meals as possible. Pt educated importnace of movement and and ROM of extremities. OT will continue to follow acutely  Follow Up Recommendations  SNF;Supervision/Assistance - 24 hour    Equipment Recommendations  None recommended by OT    Recommendations for Other Services      Precautions / Restrictions Precautions Precautions: Fall Restrictions Weight Bearing Restrictions: No       Mobility Bed Mobility Overal bed mobility: Needs Assistance Bed Mobility: Rolling;Supine to Sit;Sit to Supine Rolling: Mod assist   Supine to sit: +2 for physical assistance;Mod assist Sit to supine: +2 for physical assistance;Mod assist      Transfers                 General transfer comment: NT - pt does not transfer at baseline    Balance Overall balance assessment: Needs assistance Sitting-balance support: Feet supported;Bilateral upper extremity supported Sitting balance-Leahy Scale: Poor Sitting balance - Comments:  mod/max A for static and dynamic sitting EOB.                                    ADL either performed or assessed with clinical judgement   ADL Overall ADL's : Needs assistance/impaired     Grooming: Sitting;Wash/dry hands;Wash/dry  face Grooming Details (indicate cue type and reason): increased time and encouragement,  mod/max A for static and dynamic sitting EOB.                                General ADL Comments: Pt limited in her ability to participate in ADL. Decreased dexterity limiting self-feeding abilities. Pt with very tight shoulders limiting AROM/PROM.  mod/max A for static and dynamic sitting EOB.      Vision Patient Visual Report: No change from baseline     Perception     Praxis      Cognition Arousal/Alertness: Awake/alert Behavior During Therapy: Flat affect Overall Cognitive Status: No family/caregiver present to determine baseline cognitive functioning                                 General Comments: Slow to respond at times        Exercises Other Exercises Other Exercises: ROM to all extremities x 15 reps   Shoulder Instructions       General Comments pt sat EOB x 5 minutes for grooming and ther activity with medium size ball    Pertinent Vitals/ Pain       Pain Assessment: Faces Faces Pain Scale: Hurts little more Pain Location: LEs Pain Descriptors / Indicators: Discomfort Pain Intervention(s): Limited activity within patient's  tolerance;Monitored during session;Repositioned  Home Living                                          Prior Functioning/Environment              Frequency  Min 2X/week        Progress Toward Goals  OT Goals(current goals can now be found in the care plan section)  Progress towards OT goals: Progressing toward goals     Plan Discharge plan remains appropriate    Co-evaluation                 AM-PAC PT "6 Clicks" Daily Activity     Outcome Measure   Help from another person eating meals?: A Little Help from another person taking care of personal grooming?: A Little Help from another person toileting, which includes using toliet, bedpan, or urinal?: Total Help from another  person bathing (including washing, rinsing, drying)?: Total Help from another person to put on and taking off regular upper body clothing?: Total Help from another person to put on and taking off regular lower body clothing?: Total 6 Click Score: 10    End of Session    OT Visit Diagnosis: Other abnormalities of gait and mobility (R26.89);Pain Pain - Right/Left: (bilaterally) Pain - part of body: Leg   Activity Tolerance Patient limited by fatigue   Patient Left in bed;with call bell/phone within reach   Nurse Communication      Functional Assessment Tool Used: AM-PAC 6 Clicks Daily Activity   Time: 5956-3875 OT Time Calculation (min): 38 min  Charges: OT G-codes **NOT FOR INPATIENT CLASS** Functional Assessment Tool Used: AM-PAC 6 Clicks Daily Activity OT General Charges $OT Visit: 1 Visit OT Treatments $Self Care/Home Management : 8-22 mins $Therapeutic Activity: 23-37 mins     Galen Manila 07/05/2017, 1:48 PM

## 2017-07-05 NOTE — Social Work (Signed)
Clinical Social Worker facilitated patient discharge including contacting patient family and facility to confirm patient discharge plans.  Clinical information faxed to facility and family agreeable with plan.  CSW arranged ambulance transport via [facility] to [facility].   RN to call [phone] with report  prior to discharge.  Clinical Social Worker will sign off for now as social work intervention is no longer needed. Please consult [place] again if new need arises.  Doy Hutching, [place] Clinical Social Worker [phone]

## 2017-07-09 ENCOUNTER — Non-Acute Institutional Stay: Payer: Self-pay | Admitting: Hospice and Palliative Medicine

## 2017-07-09 DIAGNOSIS — Z515 Encounter for palliative care: Secondary | ICD-10-CM

## 2017-07-09 NOTE — Progress Notes (Signed)
PALLIATIVE CARE CONSULT VISIT   PATIENT NAME: Debra [name] DOB: [birth year] MRN: [id number]  PRIMARY CARE PROVIDER:   [name], MD  REFERRING PROVIDER:      No referring provider defined for this encounter.  RESPONSIBLE PARTY:   Self   RECOMMENDATIONS and PLAN:  1. FTT - oral intake remains poor. Staff report that patient has days where she will refuse medications and does not eat/drink. She has history of dysphagia but was evaluated and treated inpatient for depression with adjustments made to her antidepressants. Patient readily admits to poor appetite. We talked about artificial nutrition and she does not exclude the option, although she does not embrace it either.  2. Depression - Psych to follow at SNF 3. Pain - patient says this is stable on current regimen 4. GOC - patient did not readily engage in conversation about her wishes. She seems resigned to needing LTC and says she has few other options. She has an aunt who is involved. Patient has children but says she is estranged. We discussed code status and patient continues to want an attempt made at resuscitation  I spent 30 minutes providing this consultation,  from 1230 to 1300. More than 50% of the time in this consultation was spent coordinating communication.   HISTORY OF PRESENT ILLNESS:  Debra [name] is a 52 y.o. year old female with multiple medical problems including MS, T1DM, depression, dysphagia, who was recently hospitalized 06/26/17 to 07/05/17 with MRSA bacteremia presumably from sacral wound/PICC. Patient was seen in consultation by PMT and Palliative Care was asked to follow at SNF. Staff report that patient continues to eat poorly.   CODE STATUS: FC  PPS: 30% HOSPICE ELIGIBILITY/DIAGNOSIS: TBD  PAST MEDICAL HISTORY:  Past Medical History:  Diagnosis Date  . Adenomatous colonic polyps   . Anemia    2005  . Anxiety    1990  . Arthritis    "knees, hands" (05/07/2017)  . Asthma    2000   . Benign essential HTN   . Cataract   . Chronic lower back pain   . Decubital ulcer   . Depression with psychotic symptoms   . Diabetic ketoacidosis without coma associated with type 1 diabetes mellitus (HCC)   . Difficult intubation    narrow airway  . Fibromyalgia 11/07/2016  . Gastroesophageal Reflux Disease (GERD)   . Heart murmur    Birth  . Hyperlipidemia    2005  . Hypertension    1998  . Internal hemorrhoids 04/24/06   on colonoscopy  . Migraine    "used to have them all the time; now maybe q6 months" (05/07/2017)  . Multiple sclerosis (HCC)   . Neuropathic bladder   . Neuropathy of the hands & feet   . Restless Leg Syndrome   . Sepsis due to urinary tract infection (HCC) 04/12/2017  . Sleep paralysis   . Stable angina pectoris    2007: cath showing normal cors.   . Stroke 1990   denies residual on 05/07/2017  . Type I Diabetes Mellitus 1988    SOCIAL HX:  Social History   Tobacco Use  . Smoking status: Never Smoker  . Smokeless tobacco: Never Used  Substance Use Topics  . Alcohol use: No    Alcohol/week: 0.0 oz    ALLERGIES:  Allergies  Allergen Reactions  . Penicillins Anaphylaxis, Nausea And Vomiting and Rash    Has patient had a PCN reaction causing immediate rash, facial/tongue/throat swelling, SOB or  lightheadedness with hypotension: Yes Has patient had a PCN reaction causing severe rash involving mucus membranes or skin necrosis: Yes Has patient had a PCN reaction that required hospitalization No Has patient had a PCN reaction occurring within the last 10 years: Yes If all of the above answers are "NO", then may proceed with Cephalosporin use.   . Lactose Intolerance (Gi) Other (See Comments)  . Pollen Extract Other (See Comments)    Seasonal Allergies  . Tape Rash     PERTINENT MEDICATIONS:  Outpatient Encounter Medications as of 07/09/2017  Medication Sig  . acetaminophen (TYLENOL) 325 MG tablet Take 650 mg by mouth every 4 (four) hours as  needed for mild pain or fever.   . Amino Acids-Protein Hydrolys (FEEDING SUPPLEMENT, PRO-STAT SUGAR FREE 64,) LIQD Take 30 mLs by mouth 3 (three) times daily between meals.  Marland Kitchen amLODipine (NORVASC) 10 MG tablet Take 1 tablet (10 mg total) by mouth daily.  Marland Kitchen aspirin EC 81 MG tablet Take 1 tablet (81 mg total) by mouth daily.  Marland Kitchen atorvastatin (LIPITOR) 40 MG tablet Take 1 tablet (40 mg total) by mouth daily.  . Baclofen 5 MG TABS Take 5 mg by mouth 3 (three) times daily.  . bethanechol (URECHOLINE) 50 MG tablet Take 1 tablet (50 mg total) by mouth 3 (three) times daily.  . collagenase (SANTYL) ointment Apply topically 2 (two) times daily. (Patient not taking: Reported on 06/26/2017)  . DAPTOmycin 500 mg in sodium chloride 0.9 % 100 mL Inject 500 mg into the vein daily for 7 days. Through 5/16.  [name] ON 07/13/2017] doxycycline (VIBRAMYCIN) 50 MG capsule Take 2 capsules (100 mg total) by mouth 2 (two) times daily. Starting 5/17  . DULoxetine (CYMBALTA) 30 MG capsule Take 1 capsule (30 mg total) by mouth daily.  . feeding supplement, GLUCERNA SHAKE, (GLUCERNA SHAKE) LIQD Take 237 mLs by mouth 2 (two) times daily between meals. (Patient not taking: Reported on 05/05/2017)  . GLUCAGON HCL, RDNA, IJ Inject 1 mg into the muscle as needed (hypoglycemia-administer for CBG <60).  Marland Kitchen HYDROmorphone (DILAUDID) 2 MG tablet Take 0.5 tablets (1 mg total) by mouth every 4 (four) hours as needed for up to 5 days for severe pain.  Marland Kitchen insulin aspart (NOVOLOG) 100 UNIT/ML injection Inject 4-14 Units into the skin 3 (three) times daily before meals. 150-200=4 units 201-250=6 units 241-300=8 units 301-350=10 units 351-400=12 units 401-450=14 units notify PCP if >451  . insulin glargine (LANTUS) 100 UNIT/ML injection Inject 0.05 mLs (5 Units total) into the skin at bedtime.  . Lactobacillus (ACIDOPHILUS) 100 MG CAPS Take 100 mg by mouth 2 (two) times daily.  Marland Kitchen lidocaine (LIDODERM) 5 % Place 1 patch onto the skin daily. FOR  LOWER BACK PAIN Remove & Discard patch within 12 hours or as directed by MD  . lisinopril (PRINIVIL,ZESTRIL) 20 MG tablet Take 1 tablet (20 mg total) by mouth daily.  Marland Kitchen loperamide (IMODIUM) 2 MG capsule Take 2 capsules (4 mg total) by mouth as needed for diarrhea or loose stools.  . magnesium hydroxide (MILK OF MAGNESIA) 400 MG/5ML suspension Take 30 mLs by mouth daily as needed for mild constipation.  . meclizine (ANTIVERT) 12.5 MG tablet Take 12.5 mg by mouth 3 (three) times daily as needed for dizziness. per nsg. home MAR, pt. takes 1 tablet 1 hour prior to therapy.  . meropenem 1 g in sodium chloride 0.9 % 100 mL Inject 1 g into the vein every 8 (eight) hours for 7 days.  Through 5/16.  Marland Kitchen metoprolol succinate (TOPROL-XL) 100 MG 24 hr tablet Take 1 tablet (100 mg total) by mouth daily. Take with or immediately following a meal.  . mirtazapine (REMERON) 45 MG tablet Take 1 tablet (45 mg total) by mouth at bedtime.  . modafinil (PROVIGIL) 100 MG tablet Take 1 tablet (100 mg total) by mouth every morning.  . Multiple Vitamin (MULTIVITAMIN) tablet Take 1 tablet by mouth daily.  . ondansetron (ZOFRAN) 4 MG tablet Take 4 mg by mouth daily.  . pantoprazole (PROTONIX) 40 MG tablet Take 1 tablet (40 mg total) by mouth daily.  . polyethylene glycol (MIRALAX / GLYCOLAX) packet Take 17 g by mouth daily as needed for mild constipation, moderate constipation or severe constipation.  . pregabalin (LYRICA) 25 MG capsule Take 25 mg by mouth every 8 (eight) hours.   . protein supplement shake (PREMIER PROTEIN) LIQD Take 325 mLs (11 oz total) by mouth 2 (two) times daily between meals. (Patient not taking: Reported on 06/13/2017)  . tamsulosin (FLOMAX) 0.4 MG CAPS capsule Take 1 capsule (0.4 mg total) daily by mouth.  . Teriflunomide 14 MG TABS Take 14 mg by mouth daily.  . valACYclovir (VALTREX) 500 MG tablet Take 500 mg by mouth 2 (two) times daily.  . Vitamin D, Ergocalciferol, (DRISDOL) 50000 units CAPS capsule  Take 1 capsule (50,000 Units total) by mouth every 7 (seven) days. (Patient taking differently: Take 50,000 Units by mouth every 7 (seven) days. On Thursdays)   No facility-administered encounter medications on file as of 07/09/2017.     PHYSICAL EXAM:   General: NAD, frail appearing, thin, lying in bed Cardiovascular: regular rate and rhythm Pulmonary: clear ant fields Abdomen: soft, nontender, + bowel sounds GU: no suprapubic tenderness Extremities: no edema, L. Foot wrapped Skin: wounds noted but not visualized  Neurological: Weakness, speech clear, follows commands PSYCH: flat affect  Malachy Moan, NP

## 2017-07-10 NOTE — Telephone Encounter (Signed)
Calandra from [facility] has asked for a Prior Auth for the 14mg  of the Aubagio she can be reached at [phone]

## 2017-07-10 NOTE — Telephone Encounter (Signed)
[facility] (April)calling stating that since the insurance dose not have the correct start date the authorization cannot go through. Stating we will need to speak with the insurance company to inform the of the start or the "back date of April 20th". Any questions please call [phone] ext [phone]

## 2017-07-10 NOTE — Telephone Encounter (Signed)
Spoke with April with [facility] and explained Aubagio PA was done, approved by [employer].  PA# and dates given/fim

## 2017-07-10 NOTE — Telephone Encounter (Signed)
Spoke with [facility] and advised 2nd PA was done and approved today. They ran the rx. while we were on the phone and it went thru/fim

## 2017-07-10 NOTE — Telephone Encounter (Signed)
Since [facility] is still not able to get Aubagio 14mg  to go thru, I called [facility] back.  They st. they did PA for 7mg , not 14mg . New PA for 14mg  completed with Rosanne Ashing with [facility], approved for dates 07/10/17 thru 07/05/18.  PA# [id number]

## 2017-07-11 ENCOUNTER — Encounter: Payer: Self-pay | Admitting: Internal Medicine

## 2017-07-11 ENCOUNTER — Ambulatory Visit: Payer: Self-pay

## 2017-07-15 ENCOUNTER — Encounter (HOSPITAL_COMMUNITY): Payer: Self-pay | Admitting: Emergency Medicine

## 2017-07-15 ENCOUNTER — Emergency Department (HOSPITAL_COMMUNITY): Payer: Medicaid Other

## 2017-07-15 ENCOUNTER — Inpatient Hospital Stay (HOSPITAL_COMMUNITY)
Admission: EM | Admit: 2017-07-15 | Discharge: 2017-07-24 | DRG: 698 | Disposition: A | Discharge status: Skilled Nursing Facility | Payer: Medicaid Other | Attending: Student in an Organized Health Care Education/Training Program | Admitting: Student in an Organized Health Care Education/Training Program

## 2017-07-15 DIAGNOSIS — D649 Anemia, unspecified: Secondary | ICD-10-CM | POA: Diagnosis present

## 2017-07-15 DIAGNOSIS — I110 Hypertensive heart disease with heart failure: Secondary | ICD-10-CM | POA: Diagnosis present

## 2017-07-15 DIAGNOSIS — K769 Liver disease, unspecified: Secondary | ICD-10-CM | POA: Diagnosis present

## 2017-07-15 DIAGNOSIS — R109 Unspecified abdominal pain: Secondary | ICD-10-CM

## 2017-07-15 DIAGNOSIS — Z91048 Other nonmedicinal substance allergy status: Secondary | ICD-10-CM

## 2017-07-15 DIAGNOSIS — M4628 Osteomyelitis of vertebra, sacral and sacrococcygeal region: Secondary | ICD-10-CM

## 2017-07-15 DIAGNOSIS — E1069 Type 1 diabetes mellitus with other specified complication: Secondary | ICD-10-CM | POA: Diagnosis present

## 2017-07-15 DIAGNOSIS — E1042 Type 1 diabetes mellitus with diabetic polyneuropathy: Secondary | ICD-10-CM | POA: Diagnosis present

## 2017-07-15 DIAGNOSIS — E739 Lactose intolerance, unspecified: Secondary | ICD-10-CM | POA: Diagnosis present

## 2017-07-15 DIAGNOSIS — M1990 Unspecified osteoarthritis, unspecified site: Secondary | ICD-10-CM | POA: Diagnosis present

## 2017-07-15 DIAGNOSIS — Z7401 Bed confinement status: Secondary | ICD-10-CM

## 2017-07-15 DIAGNOSIS — Z1623 Resistance to quinolones and fluoroquinolones: Secondary | ICD-10-CM | POA: Diagnosis present

## 2017-07-15 DIAGNOSIS — G2581 Restless legs syndrome: Secondary | ICD-10-CM | POA: Diagnosis present

## 2017-07-15 DIAGNOSIS — K219 Gastro-esophageal reflux disease without esophagitis: Secondary | ICD-10-CM | POA: Diagnosis present

## 2017-07-15 DIAGNOSIS — L89621 Pressure ulcer of left heel, stage 1: Secondary | ICD-10-CM | POA: Diagnosis present

## 2017-07-15 DIAGNOSIS — I2510 Atherosclerotic heart disease of native coronary artery without angina pectoris: Secondary | ICD-10-CM | POA: Diagnosis present

## 2017-07-15 DIAGNOSIS — Y846 Urinary catheterization as the cause of abnormal reaction of the patient, or of later complication, without mention of misadventure at the time of the procedure: Secondary | ICD-10-CM | POA: Diagnosis present

## 2017-07-15 DIAGNOSIS — T83511A Infection and inflammatory reaction due to indwelling urethral catheter, initial encounter: Principal | ICD-10-CM | POA: Diagnosis present

## 2017-07-15 DIAGNOSIS — Z681 Body mass index (BMI) 19 or less, adult: Secondary | ICD-10-CM

## 2017-07-15 DIAGNOSIS — A419 Sepsis, unspecified organism: Secondary | ICD-10-CM | POA: Diagnosis present

## 2017-07-15 DIAGNOSIS — Y848 Other medical procedures as the cause of abnormal reaction of the patient, or of later complication, without mention of misadventure at the time of the procedure: Secondary | ICD-10-CM | POA: Diagnosis present

## 2017-07-15 DIAGNOSIS — E440 Moderate protein-calorie malnutrition: Secondary | ICD-10-CM

## 2017-07-15 DIAGNOSIS — F329 Major depressive disorder, single episode, unspecified: Secondary | ICD-10-CM | POA: Diagnosis present

## 2017-07-15 DIAGNOSIS — N319 Neuromuscular dysfunction of bladder, unspecified: Secondary | ICD-10-CM | POA: Diagnosis present

## 2017-07-15 DIAGNOSIS — B9562 Methicillin resistant Staphylococcus aureus infection as the cause of diseases classified elsewhere: Secondary | ICD-10-CM | POA: Diagnosis present

## 2017-07-15 DIAGNOSIS — B3749 Other urogenital candidiasis: Secondary | ICD-10-CM | POA: Diagnosis present

## 2017-07-15 DIAGNOSIS — Z8673 Personal history of transient ischemic attack (TIA), and cerebral infarction without residual deficits: Secondary | ICD-10-CM

## 2017-07-15 DIAGNOSIS — E10649 Type 1 diabetes mellitus with hypoglycemia without coma: Secondary | ICD-10-CM | POA: Diagnosis not present

## 2017-07-15 DIAGNOSIS — R6521 Severe sepsis with septic shock: Secondary | ICD-10-CM | POA: Diagnosis present

## 2017-07-15 DIAGNOSIS — T80211A Bloodstream infection due to central venous catheter, initial encounter: Secondary | ICD-10-CM | POA: Diagnosis present

## 2017-07-15 DIAGNOSIS — E785 Hyperlipidemia, unspecified: Secondary | ICD-10-CM | POA: Diagnosis present

## 2017-07-15 DIAGNOSIS — G9341 Metabolic encephalopathy: Secondary | ICD-10-CM | POA: Diagnosis present

## 2017-07-15 DIAGNOSIS — E43 Unspecified severe protein-calorie malnutrition: Secondary | ICD-10-CM | POA: Diagnosis present

## 2017-07-15 DIAGNOSIS — M797 Fibromyalgia: Secondary | ICD-10-CM | POA: Diagnosis present

## 2017-07-15 DIAGNOSIS — G35 Multiple sclerosis: Secondary | ICD-10-CM | POA: Diagnosis present

## 2017-07-15 DIAGNOSIS — Z880 Allergy status to penicillin: Secondary | ICD-10-CM

## 2017-07-15 DIAGNOSIS — I5030 Unspecified diastolic (congestive) heart failure: Secondary | ICD-10-CM | POA: Diagnosis present

## 2017-07-15 DIAGNOSIS — F419 Anxiety disorder, unspecified: Secondary | ICD-10-CM | POA: Diagnosis present

## 2017-07-15 DIAGNOSIS — Z79899 Other long term (current) drug therapy: Secondary | ICD-10-CM

## 2017-07-15 DIAGNOSIS — L89154 Pressure ulcer of sacral region, stage 4: Secondary | ICD-10-CM | POA: Diagnosis present

## 2017-07-15 DIAGNOSIS — J9600 Acute respiratory failure, unspecified whether with hypoxia or hypercapnia: Secondary | ICD-10-CM | POA: Diagnosis present

## 2017-07-15 DIAGNOSIS — J45909 Unspecified asthma, uncomplicated: Secondary | ICD-10-CM | POA: Diagnosis present

## 2017-07-15 DIAGNOSIS — E872 Acidosis: Secondary | ICD-10-CM | POA: Diagnosis present

## 2017-07-15 DIAGNOSIS — L89611 Pressure ulcer of right heel, stage 1: Secondary | ICD-10-CM | POA: Diagnosis present

## 2017-07-15 DIAGNOSIS — N12 Tubulo-interstitial nephritis, not specified as acute or chronic: Secondary | ICD-10-CM | POA: Diagnosis present

## 2017-07-15 DIAGNOSIS — J9690 Respiratory failure, unspecified, unspecified whether with hypoxia or hypercapnia: Secondary | ICD-10-CM

## 2017-07-15 DIAGNOSIS — R1310 Dysphagia, unspecified: Secondary | ICD-10-CM | POA: Diagnosis present

## 2017-07-15 MED ORDER — SODIUM CHLORIDE 0.9 % IV SOLN
1.0000 g | Freq: Three times a day (TID) | INTRAVENOUS | Status: DC
  Filled 2017-07-15: qty 1

## 2017-07-15 MED ORDER — SODIUM CHLORIDE 0.9 % IV BOLUS (SEPSIS)
500.0000 mL | Freq: Once | INTRAVENOUS | Status: AC
  Administered 2017-07-16: 500 mL via INTRAVENOUS

## 2017-07-15 MED ORDER — SUCCINYLCHOLINE CHLORIDE 20 MG/ML IJ SOLN
100.0000 mg | Freq: Once | INTRAMUSCULAR | Status: AC
  Administered 2017-07-15: 100 mg via INTRAVENOUS

## 2017-07-15 MED ORDER — VANCOMYCIN HCL IN DEXTROSE 1-5 GM/200ML-% IV SOLN
1000.0000 mg | Freq: Once | INTRAVENOUS | Status: DC
  Administered 2017-07-16: 1000 mg via INTRAVENOUS
  Filled 2017-07-15: qty 200

## 2017-07-15 MED ORDER — SODIUM CHLORIDE 0.9 % IV BOLUS (SEPSIS)
1000.0000 mL | Freq: Once | INTRAVENOUS | Status: AC
  Administered 2017-07-15: 1000 mL via INTRAVENOUS

## 2017-07-15 MED ORDER — SODIUM CHLORIDE 0.9 % IV BOLUS (SEPSIS)
250.0000 mL | Freq: Once | INTRAVENOUS | Status: AC
  Administered 2017-07-16: 250 mL via INTRAVENOUS

## 2017-07-15 MED ORDER — VANCOMYCIN HCL 500 MG IV SOLR
500.0000 mg | Freq: Two times a day (BID) | INTRAVENOUS | Status: DC
  Filled 2017-07-15: qty 500

## 2017-07-15 MED ORDER — LEVOFLOXACIN IN D5W 750 MG/150ML IV SOLN: 750.0000 mg | INTRAVENOUS | Status: DC

## 2017-07-15 MED ORDER — NOREPINEPHRINE 4 MG/250ML-% IV SOLN
0.0000 ug/min | Freq: Once | INTRAVENOUS | Status: AC
  Administered 2017-07-15: 10 ug/min via INTRAVENOUS
  Filled 2017-07-15: qty 250

## 2017-07-15 MED ORDER — LEVOFLOXACIN IN D5W 750 MG/150ML IV SOLN
750.0000 mg | Freq: Once | INTRAVENOUS | Status: DC
  Filled 2017-07-15: qty 150

## 2017-07-15 MED ORDER — SODIUM CHLORIDE 0.9 % IV SOLN
2.0000 g | Freq: Once | INTRAVENOUS | Status: AC
  Administered 2017-07-16: 2 g via INTRAVENOUS
  Filled 2017-07-15: qty 2

## 2017-07-15 MED ORDER — ETOMIDATE 2 MG/ML IV SOLN
20.0000 mg | Freq: Once | INTRAVENOUS | Status: AC
Start: 2017-07-15 — End: 2017-07-15
  Administered 2017-07-15: 20 mg via INTRAVENOUS

## 2017-07-15 NOTE — Progress Notes (Addendum)
Pharmacy Antibiotic Note  Debra [name] is a 52 y.o. female admitted on 07/15/2017 with sepsis.  Pharmacy has been consulted for vancomycin, aztreonam and levaquin dosing. Initial doses ordered in the ED Now changed to zyvox and meropenem  Plan: Meropenem 1gm IV q8 hours Zyvox as per MD F/u renal function, cultures and clinical course  Height: 5\' 6"  (167.6 cm) IBW/kg (Calculated) : 59.3  No data recorded.  No results for input(s): WBC, CREATININE, LATICACIDVEN, VANCOTROUGH, VANCOPEAK, VANCORANDOM, GENTTROUGH, GENTPEAK, GENTRANDOM, TOBRATROUGH, TOBRAPEAK, TOBRARND, AMIKACINPEAK, AMIKACINTROU, AMIKACIN in the last 168 hours.  Estimated Creatinine Clearance: 69.5 mL/min (by C-G formula based on SCr of 0.85 mg/dL).    Allergies  Allergen Reactions  . Penicillins Anaphylaxis, Nausea And Vomiting and Rash    Has patient had a PCN reaction causing immediate rash, facial/tongue/throat swelling, SOB or lightheadedness with hypotension: Yes Has patient had a PCN reaction causing severe rash involving mucus membranes or skin necrosis: Yes Has patient had a PCN reaction that required hospitalization No Has patient had a PCN reaction occurring within the last 10 years: Yes If all of the above answers are "NO", then may proceed with Cephalosporin use.   . Lactose Intolerance (Gi) Other (See Comments)  . Pollen Extract Other (See Comments)    Seasonal Allergies  . Tape Rash     Thank you for allowing pharmacy to be a part of this patient's care.  Talbert Cage [place]et 07/15/2017 11:52 PM

## 2017-07-15 NOTE — ED Triage Notes (Signed)
Patient presents to ED for assessment from [facility], found unresponsive by staff, Hx of MS and DM.  Staff states LSN was "first shift", unable to give a specific time.  BP 75/40 en route.  CBG 89.  Patient arrives being bagged by EMS

## 2017-07-16 ENCOUNTER — Inpatient Hospital Stay (HOSPITAL_COMMUNITY): Payer: Medicaid Other

## 2017-07-16 DIAGNOSIS — I2510 Atherosclerotic heart disease of native coronary artery without angina pectoris: Secondary | ICD-10-CM | POA: Diagnosis present

## 2017-07-16 DIAGNOSIS — M4628 Osteomyelitis of vertebra, sacral and sacrococcygeal region: Secondary | ICD-10-CM | POA: Diagnosis present

## 2017-07-16 DIAGNOSIS — Z794 Long term (current) use of insulin: Secondary | ICD-10-CM | POA: Diagnosis not present

## 2017-07-16 DIAGNOSIS — A419 Sepsis, unspecified organism: Secondary | ICD-10-CM | POA: Diagnosis present

## 2017-07-16 DIAGNOSIS — J45909 Unspecified asthma, uncomplicated: Secondary | ICD-10-CM | POA: Diagnosis present

## 2017-07-16 DIAGNOSIS — N12 Tubulo-interstitial nephritis, not specified as acute or chronic: Secondary | ICD-10-CM | POA: Diagnosis present

## 2017-07-16 DIAGNOSIS — M1990 Unspecified osteoarthritis, unspecified site: Secondary | ICD-10-CM | POA: Diagnosis present

## 2017-07-16 DIAGNOSIS — J9601 Acute respiratory failure with hypoxia: Secondary | ICD-10-CM | POA: Diagnosis not present

## 2017-07-16 DIAGNOSIS — I5030 Unspecified diastolic (congestive) heart failure: Secondary | ICD-10-CM | POA: Diagnosis present

## 2017-07-16 DIAGNOSIS — N319 Neuromuscular dysfunction of bladder, unspecified: Secondary | ICD-10-CM | POA: Diagnosis present

## 2017-07-16 DIAGNOSIS — I10 Essential (primary) hypertension: Secondary | ICD-10-CM | POA: Diagnosis not present

## 2017-07-16 DIAGNOSIS — T83511A Infection and inflammatory reaction due to indwelling urethral catheter, initial encounter: Principal | ICD-10-CM

## 2017-07-16 DIAGNOSIS — I110 Hypertensive heart disease with heart failure: Secondary | ICD-10-CM | POA: Diagnosis present

## 2017-07-16 DIAGNOSIS — Y846 Urinary catheterization as the cause of abnormal reaction of the patient, or of later complication, without mention of misadventure at the time of the procedure: Secondary | ICD-10-CM | POA: Diagnosis present

## 2017-07-16 DIAGNOSIS — G2581 Restless legs syndrome: Secondary | ICD-10-CM | POA: Diagnosis present

## 2017-07-16 DIAGNOSIS — N390 Urinary tract infection, site not specified: Secondary | ICD-10-CM | POA: Diagnosis not present

## 2017-07-16 DIAGNOSIS — T80211A Bloodstream infection due to central venous catheter, initial encounter: Secondary | ICD-10-CM | POA: Diagnosis present

## 2017-07-16 DIAGNOSIS — B9562 Methicillin resistant Staphylococcus aureus infection as the cause of diseases classified elsewhere: Secondary | ICD-10-CM | POA: Diagnosis not present

## 2017-07-16 DIAGNOSIS — K219 Gastro-esophageal reflux disease without esophagitis: Secondary | ICD-10-CM | POA: Diagnosis not present

## 2017-07-16 DIAGNOSIS — G35 Multiple sclerosis: Secondary | ICD-10-CM | POA: Diagnosis present

## 2017-07-16 DIAGNOSIS — F419 Anxiety disorder, unspecified: Secondary | ICD-10-CM | POA: Diagnosis present

## 2017-07-16 DIAGNOSIS — G9341 Metabolic encephalopathy: Secondary | ICD-10-CM | POA: Diagnosis present

## 2017-07-16 DIAGNOSIS — E43 Unspecified severe protein-calorie malnutrition: Secondary | ICD-10-CM | POA: Diagnosis present

## 2017-07-16 DIAGNOSIS — G9340 Encephalopathy, unspecified: Secondary | ICD-10-CM | POA: Diagnosis not present

## 2017-07-16 DIAGNOSIS — I361 Nonrheumatic tricuspid (valve) insufficiency: Secondary | ICD-10-CM | POA: Diagnosis not present

## 2017-07-16 DIAGNOSIS — A4159 Other Gram-negative sepsis: Secondary | ICD-10-CM | POA: Diagnosis not present

## 2017-07-16 DIAGNOSIS — Z960 Presence of urogenital implants: Secondary | ICD-10-CM | POA: Diagnosis not present

## 2017-07-16 DIAGNOSIS — Z7401 Bed confinement status: Secondary | ICD-10-CM | POA: Diagnosis not present

## 2017-07-16 DIAGNOSIS — B3749 Other urogenital candidiasis: Secondary | ICD-10-CM | POA: Diagnosis present

## 2017-07-16 DIAGNOSIS — M797 Fibromyalgia: Secondary | ICD-10-CM | POA: Diagnosis present

## 2017-07-16 DIAGNOSIS — L89159 Pressure ulcer of sacral region, unspecified stage: Secondary | ICD-10-CM | POA: Diagnosis not present

## 2017-07-16 DIAGNOSIS — Y848 Other medical procedures as the cause of abnormal reaction of the patient, or of later complication, without mention of misadventure at the time of the procedure: Secondary | ICD-10-CM | POA: Diagnosis present

## 2017-07-16 DIAGNOSIS — L89154 Pressure ulcer of sacral region, stage 4: Secondary | ICD-10-CM | POA: Diagnosis present

## 2017-07-16 DIAGNOSIS — J9600 Acute respiratory failure, unspecified whether with hypoxia or hypercapnia: Secondary | ICD-10-CM | POA: Diagnosis present

## 2017-07-16 DIAGNOSIS — E872 Acidosis: Secondary | ICD-10-CM | POA: Diagnosis present

## 2017-07-16 DIAGNOSIS — F329 Major depressive disorder, single episode, unspecified: Secondary | ICD-10-CM | POA: Diagnosis present

## 2017-07-16 DIAGNOSIS — R6521 Severe sepsis with septic shock: Secondary | ICD-10-CM | POA: Diagnosis present

## 2017-07-16 DIAGNOSIS — E109 Type 1 diabetes mellitus without complications: Secondary | ICD-10-CM | POA: Diagnosis not present

## 2017-07-16 DIAGNOSIS — R8279 Other abnormal findings on microbiological examination of urine: Secondary | ICD-10-CM | POA: Diagnosis not present

## 2017-07-16 DIAGNOSIS — R7881 Bacteremia: Secondary | ICD-10-CM | POA: Diagnosis not present

## 2017-07-16 DIAGNOSIS — Z681 Body mass index (BMI) 19 or less, adult: Secondary | ICD-10-CM | POA: Diagnosis not present

## 2017-07-16 LAB — URINALYSIS, ROUTINE W REFLEX MICROSCOPIC
BILIRUBIN URINE: NEGATIVE
Glucose, UA: 50 mg/dL — AB
Ketones, ur: 5 mg/dL — AB
NITRITE: NEGATIVE
PROTEIN: 100 mg/dL — AB
RBC / HPF: >50 RBC/hpf — ABNORMAL HIGH (ref 0–5)
Specific Gravity, Urine: 1.013 (ref 1.005–1.030)
pH: 6 (ref 5.0–8.0)

## 2017-07-16 LAB — CBC WITH DIFFERENTIAL/PLATELET
Abs Immature Granulocytes: 0.1 10*3/uL (ref 0.0–0.1)
Basophils Absolute: 0.1 10*3/uL (ref 0.0–0.1)
Basophils Relative: 1 %
EOS PCT: 1 %
Eosinophils Absolute: 0.2 10*3/uL (ref 0.0–0.7)
HEMATOCRIT: 33.5 % — AB (ref 36.0–46.0)
HEMOGLOBIN: 10.7 g/dL — AB (ref 12.0–15.0)
IMMATURE GRANULOCYTES: 1 %
LYMPHS ABS: 2.9 10*3/uL (ref 0.7–4.0)
LYMPHS PCT: 24 %
MCH: 27.6 pg (ref 26.0–34.0)
MCHC: 31.9 g/dL (ref 30.0–36.0)
MCV: 86.6 fL (ref 78.0–100.0)
Monocytes Absolute: 0.6 10*3/uL (ref 0.1–1.0)
Monocytes Relative: 5 %
NEUTROS PCT: 68 %
Neutro Abs: 8 10*3/uL — ABNORMAL HIGH (ref 1.7–7.7)
Platelets: 263 10*3/uL (ref 150–400)
RBC: 3.87 MIL/uL (ref 3.87–5.11)
RDW: 17.5 % — ABNORMAL HIGH (ref 11.5–15.5)
WBC: 11.8 10*3/uL — AB (ref 4.0–10.5)

## 2017-07-16 LAB — PROCALCITONIN: Procalcitonin: 0.17 ng/mL

## 2017-07-16 LAB — I-STAT ARTERIAL BLOOD GAS, ED
Acid-base deficit: 4 mmol/L — ABNORMAL HIGH (ref 0.0–2.0)
Acid-base deficit: 3 mmol/L — ABNORMAL HIGH (ref 0.0–2.0)
BICARBONATE: 22.6 mmol/L (ref 20.0–28.0)
Bicarbonate: 23.1 mmol/L (ref 20.0–28.0)
O2 Saturation: 43 %
O2 Saturation: 100 %
PCO2 ART: 44 mmHg (ref 32.0–48.0)
PO2 ART: 23 mmHg — AB (ref 83.0–108.0)
Patient temperature: 94.3
TCO2: 24 mmol/L (ref 22–32)
TCO2: 24 mmol/L (ref 22–32)
pCO2 arterial: 39.8 mmHg (ref 32.0–48.0)
pH, Arterial: 7.306 — ABNORMAL LOW (ref 7.350–7.450)
pH, Arterial: 7.361 (ref 7.350–7.450)
pO2, Arterial: 601 mmHg — ABNORMAL HIGH (ref 83.0–108.0)

## 2017-07-16 LAB — COMPREHENSIVE METABOLIC PANEL
ALT: 12 U/L — ABNORMAL LOW (ref 14–54)
AST: 43 U/L — ABNORMAL HIGH (ref 15–41)
Albumin: 1.5 g/dL — ABNORMAL LOW (ref 3.5–5.0)
Alkaline Phosphatase: 224 U/L — ABNORMAL HIGH (ref 38–126)
Anion gap: 6 (ref 5–15)
BUN: 17 mg/dL (ref 6–20)
CO2: 21 mmol/L — ABNORMAL LOW (ref 22–32)
Calcium: 8.5 mg/dL — ABNORMAL LOW (ref 8.9–10.3)
Chloride: 116 mmol/L — ABNORMAL HIGH (ref 101–111)
Creatinine, Ser: 1.75 mg/dL — ABNORMAL HIGH (ref 0.44–1.00)
GFR calc Af Amer: 38 mL/min — ABNORMAL LOW (ref >60)
GFR calc non Af Amer: 33 mL/min — ABNORMAL LOW (ref >60)
Glucose, Bld: 92 mg/dL (ref 65–99)
Potassium: 2.9 mmol/L — ABNORMAL LOW (ref 3.5–5.1)
Sodium: 143 mmol/L (ref 135–145)
Total Bilirubin: 0.4 mg/dL (ref 0.3–1.2)
Total Protein: 5.4 g/dL — ABNORMAL LOW (ref 6.5–8.1)

## 2017-07-16 LAB — BASIC METABOLIC PANEL
Anion gap: 9 (ref 5–15)
BUN: 15 mg/dL (ref 6–20)
CALCIUM: 7.6 mg/dL — AB (ref 8.9–10.3)
CO2: 17 mmol/L — ABNORMAL LOW (ref 22–32)
Chloride: 117 mmol/L — ABNORMAL HIGH (ref 101–111)
Creatinine, Ser: 1.52 mg/dL — ABNORMAL HIGH (ref 0.44–1.00)
GFR calc Af Amer: 45 mL/min — ABNORMAL LOW (ref >60)
GFR, EST NON AFRICAN AMERICAN: 39 mL/min — AB (ref >60)
GLUCOSE: 118 mg/dL — AB (ref 65–99)
Potassium: 3 mmol/L — ABNORMAL LOW (ref 3.5–5.1)
Sodium: 143 mmol/L (ref 135–145)

## 2017-07-16 LAB — GLUCOSE, CAPILLARY
GLUCOSE-CAPILLARY: 63 mg/dL — AB (ref 65–99)
GLUCOSE-CAPILLARY: 135 mg/dL — AB (ref 65–99)
Glucose-Capillary: 87 mg/dL (ref 65–99)
Glucose-Capillary: 104 mg/dL — ABNORMAL HIGH (ref 65–99)
Glucose-Capillary: 122 mg/dL — ABNORMAL HIGH (ref 65–99)
Glucose-Capillary: 84 mg/dL (ref 65–99)
Glucose-Capillary: 213 mg/dL — ABNORMAL HIGH (ref 65–99)
Glucose-Capillary: 262 mg/dL — ABNORMAL HIGH (ref 65–99)

## 2017-07-16 LAB — TROPONIN I
Troponin I: 0.03 ng/mL (ref <0.03)
Troponin I: 0.21 ng/mL (ref <0.03)
Troponin I: 0.21 ng/mL (ref <0.03)

## 2017-07-16 LAB — MAGNESIUM
Magnesium: 1.8 mg/dL (ref 1.7–2.4)
Magnesium: 2 mg/dL (ref 1.7–2.4)

## 2017-07-16 LAB — CORTISOL: CORTISOL PLASMA: 32.4 ug/dL

## 2017-07-16 LAB — LACTIC ACID, PLASMA: Lactic Acid, Venous: 1.6 mmol/L (ref 0.5–1.9)

## 2017-07-16 LAB — MRSA PCR SCREENING: MRSA BY PCR: NEGATIVE

## 2017-07-16 LAB — I-STAT CG4 LACTIC ACID, ED: Lactic Acid, Venous: 1.94 mmol/L — ABNORMAL HIGH (ref 0.5–1.9)

## 2017-07-16 LAB — PHOSPHORUS: Phosphorus: 2.9 mg/dL (ref 2.5–4.6)

## 2017-07-16 MED ORDER — NOREPINEPHRINE 16 MG/250ML-% IV SOLN
0.0000 ug/min | INTRAVENOUS | Status: DC
  Administered 2017-07-16: 1 ug/min via INTRAVENOUS
  Filled 2017-07-16 (×3): qty 250

## 2017-07-16 MED ORDER — SODIUM CHLORIDE 0.9 % IV BOLUS
1000.0000 mL | Freq: Once | INTRAVENOUS | Status: AC
  Administered 2017-07-16: 1000 mL via INTRAVENOUS

## 2017-07-16 MED ORDER — FENTANYL CITRATE (PF) 100 MCG/2ML IJ SOLN: 100.0000 ug | INTRAMUSCULAR | Status: DC | PRN

## 2017-07-16 MED ORDER — PANTOPRAZOLE SODIUM 40 MG PO PACK
40.0000 mg | PACK | Freq: Every day | ORAL | Status: DC
  Administered 2017-07-16 – 2017-07-18 (×3): 40 mg
  Filled 2017-07-16 (×4): qty 20

## 2017-07-16 MED ORDER — MIDAZOLAM HCL 2 MG/2ML IJ SOLN: 2.0000 mg | INTRAMUSCULAR | Status: DC | PRN

## 2017-07-16 MED ORDER — POTASSIUM CHLORIDE 20 MEQ PO PACK
40.0000 meq | PACK | Freq: Two times a day (BID) | ORAL | Status: DC
  Administered 2017-07-16 – 2017-07-17 (×3): 40 meq via ORAL
  Filled 2017-07-16 (×3): qty 2

## 2017-07-16 MED ORDER — VITAL AF 1.2 CAL PO LIQD
1000.0000 mL | ORAL | Status: DC
  Administered 2017-07-16 – 2017-07-17 (×2): 1000 mL

## 2017-07-16 MED ORDER — DOCUSATE SODIUM 50 MG/5ML PO LIQD
100.0000 mg | Freq: Two times a day (BID) | ORAL | Status: DC
  Administered 2017-07-16 – 2017-07-24 (×11): 100 mg
  Filled 2017-07-16 (×17): qty 10

## 2017-07-16 MED ORDER — SODIUM CHLORIDE 0.9% FLUSH: 10.0000 mL | INTRAVENOUS | Status: DC | PRN

## 2017-07-16 MED ORDER — SODIUM CHLORIDE 0.9% FLUSH
10.0000 mL | Freq: Two times a day (BID) | INTRAVENOUS | Status: DC
  Administered 2017-07-16 – 2017-07-18 (×6): 10 mL
  Administered 2017-07-19: 20 mL
  Administered 2017-07-19 – 2017-07-24 (×6): 10 mL

## 2017-07-16 MED ORDER — SODIUM CHLORIDE 0.9 % IV SOLN: 250.0000 mL | INTRAVENOUS | Status: DC | PRN

## 2017-07-16 MED ORDER — CHLORHEXIDINE GLUCONATE 0.12% ORAL RINSE (MEDLINE KIT)
15.0000 mL | Freq: Two times a day (BID) | OROMUCOSAL | Status: DC
  Administered 2017-07-16 – 2017-07-18 (×6): 15 mL via OROMUCOSAL

## 2017-07-16 MED ORDER — LINEZOLID 600 MG/300ML IV SOLN
600.0000 mg | Freq: Two times a day (BID) | INTRAVENOUS | Status: DC
  Administered 2017-07-16 – 2017-07-17 (×4): 600 mg via INTRAVENOUS
  Filled 2017-07-16 (×8): qty 300

## 2017-07-16 MED ORDER — SODIUM CHLORIDE 0.9 % IV SOLN
INTRAVENOUS | Status: DC | PRN
  Administered 2017-07-21: 15:00:00 via INTRA_ARTERIAL

## 2017-07-16 MED ORDER — ORAL CARE MOUTH RINSE
15.0000 mL | OROMUCOSAL | Status: DC
  Administered 2017-07-16 – 2017-07-18 (×26): 15 mL via OROMUCOSAL

## 2017-07-16 MED ORDER — POTASSIUM CHLORIDE 10 MEQ/50ML IV SOLN
10.0000 meq | INTRAVENOUS | Status: AC
  Administered 2017-07-16 (×4): 10 meq via INTRAVENOUS
  Filled 2017-07-16 (×8): qty 50

## 2017-07-16 MED ORDER — HEPARIN SODIUM (PORCINE) 5000 UNIT/ML IJ SOLN
5000.0000 [IU] | Freq: Three times a day (TID) | INTRAMUSCULAR | Status: DC
  Administered 2017-07-16 – 2017-07-20 (×13): 5000 [IU] via SUBCUTANEOUS
  Filled 2017-07-16 (×13): qty 1

## 2017-07-16 MED ORDER — INSULIN ASPART 100 UNIT/ML ~~LOC~~ SOLN
0.0000 [IU] | SUBCUTANEOUS | Status: DC
  Administered 2017-07-16: 5 [IU] via SUBCUTANEOUS
  Administered 2017-07-16: 2 [IU] via SUBCUTANEOUS
  Administered 2017-07-17: 3 [IU] via SUBCUTANEOUS
  Administered 2017-07-17: 8 [IU] via SUBCUTANEOUS
  Administered 2017-07-17: 3 [IU] via SUBCUTANEOUS
  Administered 2017-07-17 – 2017-07-18 (×2): 2 [IU] via SUBCUTANEOUS
  Administered 2017-07-18: 3 [IU] via SUBCUTANEOUS
  Administered 2017-07-18 – 2017-07-19 (×5): 2 [IU] via SUBCUTANEOUS
  Administered 2017-07-20 (×2): 3 [IU] via SUBCUTANEOUS

## 2017-07-16 MED ORDER — POTASSIUM CHLORIDE 20 MEQ/15ML (10%) PO SOLN
40.0000 meq | Freq: Once | ORAL | Status: AC
  Administered 2017-07-16: 40 meq via ORAL
  Filled 2017-07-16: qty 30

## 2017-07-16 MED ORDER — SODIUM CHLORIDE 0.9 % IV SOLN
INTRAVENOUS | Status: DC
  Administered 2017-07-16: 03:00:00 via INTRAVENOUS

## 2017-07-16 MED ORDER — DEXTROSE 50 % IV SOLN
INTRAVENOUS | Status: AC
Start: 2017-07-16 — End: 2017-07-16
  Administered 2017-07-16: 25 mL
  Filled 2017-07-16: qty 50

## 2017-07-16 MED ORDER — MAGNESIUM SULFATE 2 GM/50ML IV SOLN
2.0000 g | Freq: Once | INTRAVENOUS | Status: AC
  Administered 2017-07-16: 2 g via INTRAVENOUS
  Filled 2017-07-16: qty 50

## 2017-07-16 MED ORDER — VITAL HIGH PROTEIN PO LIQD
1000.0000 mL | ORAL | Status: DC
  Administered 2017-07-16: 1000 mL

## 2017-07-16 MED ORDER — SODIUM CHLORIDE 0.9 % IV SOLN
1.0000 g | Freq: Three times a day (TID) | INTRAVENOUS | Status: AC
  Administered 2017-07-16 – 2017-07-22 (×19): 1 g via INTRAVENOUS
  Filled 2017-07-16 (×22): qty 1

## 2017-07-16 MED ORDER — CHLORHEXIDINE GLUCONATE CLOTH 2 % EX PADS
6.0000 | MEDICATED_PAD | Freq: Every day | CUTANEOUS | Status: DC
  Administered 2017-07-16 – 2017-07-22 (×7): 6 via TOPICAL

## 2017-07-16 MED ORDER — DEXTROSE-NACL 5-0.9 % IV SOLN
INTRAVENOUS | Status: DC
  Administered 2017-07-16: 05:00:00 via INTRAVENOUS

## 2017-07-16 MED ORDER — COLLAGENASE 250 UNIT/GM EX OINT
TOPICAL_OINTMENT | Freq: Every day | CUTANEOUS | Status: DC
  Administered 2017-07-16 – 2017-07-23 (×8): via TOPICAL
  Filled 2017-07-16: qty 30

## 2017-07-16 NOTE — Progress Notes (Signed)
Hypoglycemic Event  CBG: 63  Treatment: D50 IV 25 mL  Symptoms: None  Follow-up CBG: Time:416 CBG Result:104  Possible Reasons for Event: Unknown  Comments/MD notified:ELINK     Debra [name] A

## 2017-07-16 NOTE — ED Notes (Signed)
Critical care at the bedside speaking with patients aunt.

## 2017-07-16 NOTE — Consult Note (Addendum)
WOC Nurse wound consult note Reason for Consult: Consult requested for sacrum. Pt is emaciated and critically ill with multiple systemic factors which can impair healing. 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  Pt has several areas of healing wounds and dark scar tissue; left posterior shoulder, left outer leg. Posterior head with small amt tan-pink drainage, but unable to assess a wound since there is intact, matted hair covering the posterior head.  There is mod amt thick drainage from vagina area; no open wound observed but labia is swollen.  Dressing procedure/placement/frequency:  Santyl for enzymatic debridement of nonviable tissue to left ischium and sacrum wounds.  Pt is on a low airloss bed to reduce pressure. No family present to discuss plan of care. Please re-consult if further assistance is needed.  Thank-you,  Cammie Mcgee MSN, RN, CWOCN, [place], CNS [phone]

## 2017-07-16 NOTE — ED Provider Notes (Signed)
[facility] EMERGENCY DEPARTMENT Provider Note   CSN: [id number] Arrival date & time: 07/15/17  2308     History   Chief Complaint Chief Complaint  Patient presents with  . unresponsive    HPI Debra [name] is a 52 y.o. female.  Patient brought to the emergency department by EMS from nursing home for evaluation of unresponsiveness.  EMS report that the nursing home staff had very little information to provide.  They could only tell staff told EMS that she seemed okay on first shift but no new at what time that was.  Someone checked on her tonight and she was not responsive.  EMS report that she has been hypotensive during transport.  They thought she might have aspirated.  They have been assisting her ventilations with bag valve mask.  Patient has been hypotensive. Level V Caveat due to acuity.     Past Medical History:  Diagnosis Date  . Adenomatous colonic polyps   . Anemia    2005  . Anxiety    1990  . Arthritis    "knees, hands" (05/07/2017)  . Asthma    2000  . Benign essential HTN   . Cataract   . Chronic lower back pain   . Decubital ulcer   . Depression with psychotic symptoms   . Diabetic ketoacidosis without coma associated with type 1 diabetes mellitus ([place])   . Difficult intubation    narrow airway  . Fibromyalgia 11/07/2016  . Gastroesophageal Reflux Disease (GERD)   . Heart murmur    Birth  . Hyperlipidemia    2005  . Hypertension    1998  . Internal hemorrhoids 04/24/06   on colonoscopy  . Migraine    "used to have them all the time; now maybe q6 months" (05/07/2017)  . Multiple sclerosis ([place])   . Neuropathic bladder   . Neuropathy of the hands & feet   . Restless Leg Syndrome   . Sepsis due to urinary tract infection ([place]) 04/12/2017  . Sleep paralysis   . Stable angina pectoris    2007: cath showing normal cors.   . Stroke 1990   denies residual on 05/07/2017  . Type I Diabetes Mellitus 1988    Patient Active  Problem List   Diagnosis Date Noted  . Sacral osteomyelitis ([place])   . Sacral decubitus ulcer, stage IV ([place])   . Malnutrition of moderate degree 07/17/2017  . Sepsis ([place]) 07/16/2017  . Adjustment disorder with mixed anxiety and depressed mood   . MS (multiple sclerosis) ([place])   . Palliative care by specialist   . Advance care planning   . Major depressive disorder, recurrent episode, moderate ([place]) 07/02/2017  . Staphylococcus epidermidis sepsis ([place])   . PICC line infection, subsequent encounter   . Severe sepsis ([place]) 06/26/2017  . Acute metabolic encephalopathy   . Acute respiratory failure ([place])   . Acute pyelonephritis   . Normocytic anemia 05/25/2017  . Decubitus ulcer of coccygeal region, stage IV ([place]) 05/21/2017  . 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 Proteus infection 05/06/2017  . Foley catheter in place on admission 05/06/2017  . Hyperglycemia 05/05/2017  . Bacteriuria 05/05/2017  . Recurrent UTI   . Palliative care encounter   . Goals of care, counseling/discussion   . Pressure injury of skin 04/14/2017  . Aortic atherosclerosis ([place]) 03/14/2017  . Major depressive disorder, single episode, severe without psychosis ([place]) 01/02/2017  .  Dysthymia 12/27/2016  . HTN (hypertension)   . Neurogenic bladder   . Labile blood pressure   . Leukocytosis   . Labile blood glucose   . Sleep disturbance   . Multiple sclerosis ([place]) 11/22/2016  . Thoracic root lesion 11/22/2016  . Neuropathic pain   . Leg weakness, bilateral 11/20/2016  . History of CVA (cerebrovascular accident)   . Uncontrolled type 1 diabetes mellitus with diabetic peripheral neuropathy ([place])   . Diabetic acidosis ([place]) 11/15/2016  . Back pain 10/31/2016  . AKI (acute kidney injury) ([place])   . Stable angina pectoris ([place])   . Vitamin D deficiency 08/31/2014  . 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  Hyperlipidemia 10/06/2013  . Chronic diarrhea 10/19/2010  . Peripheral neuropathy 08/19/2010  . Type 1 diabetes mellitus ([place]) 07/06/2010  . Hypertension 07/06/2010  . Asthma 07/06/2010    Past Surgical History:  Procedure Laterality Date  . BREAST SURGERY Left    biopsy left breast  . CARDIAC CATHETERIZATION  ~ 2007/2008  . COLONOSCOPY W/ BIOPSIES AND POLYPECTOMY    . DEBRIDMENT OF DECUBITUS ULCER N/A 05/21/2017   Procedure: DEBRIDMENT OF SACRAL DECUBITUS ULCER;  Surgeon: Fanny Skates, MD;  Location: [facility];  Service: General;  Laterality: N/A;  . DILATION AND CURETTAGE OF UTERUS  12/29/2010   Surgeon: Osborne Oman, MD;  Location: [facility];  Service: Gynecology  . ENDOMETRIAL ABLATION W/ NOVASURE N/A 12/2009  . EYE SURGERY Bilateral    "lots; laser tto correct broken blood vessels from diabetes"  . FOOT SURGERY Bilateral    "bones fused; bones removed"  . FRACTURE SURGERY    . HYSTEROSCOPY  12/29/2010   Procedure: HYSTEROSCOPY WITH HYDROTHERMAL ABLATION;  Surgeon: Osborne Oman, MD;  Location: [facility];  Service: Gynecology;;  . IUD REMOVAL  12/29/2010   Procedure: INTRAUTERINE DEVICE (IUD) REMOVAL;  Surgeon: Osborne Oman, MD;  Location: [facility];  Service: Gynecology;  Laterality: N/A;  . LAPAROSCOPIC CHOLECYSTECTOMY    . LAPAROSCOPIC TUBAL LIGATION  12/29/2010   Procedure: LAPAROSCOPIC TUBAL LIGATION;  Surgeon: Osborne Oman, MD;  Location: [facility];  Service: Gynecology;  Laterality: Bilateral;  . ORIF ANKLE FRACTURE Right 10/09/2013   Procedure: Open reduction internal fixation right ankle;  Surgeon: Nita Sells, MD;  Location: [facility];  Service: Orthopedics;  Laterality: Right;  Open reduction internal fixation right ankle  . TRIGGER FINGER RELEASE     x 3  . TUBAL LIGATION       OB History    Gravida  2   Para  2   Term  1   Preterm  1   AB  0   Living  2     SAB  0   TAB  0   Ectopic  0   Multiple  0   Live Births  2             Home Medications    Prior to Admission medications   Medication Sig Start Date End Date Taking? Authorizing Provider  acetaminophen (TYLENOL) 325 MG tablet Take 650 mg by mouth every 4 (four) hours as needed for mild pain or fever.    Yes [provider]  Amino Acids-Protein Hydrolys (FEEDING SUPPLEMENT, PRO-STAT SUGAR FREE 64,) LIQD Take 30 mLs by mouth 3 (three) times daily between meals. 07/05/17  Yes Velna Ochs, MD  amLODipine (NORVASC) 10 MG tablet Take 1 tablet (10 mg total) by mouth daily. 02/14/17  02/14/18 Yes Katherine Roan, MD  atorvastatin (LIPITOR) 40 MG tablet Take 1 tablet (40 mg total) by mouth daily. Patient taking differently: Take 40 mg by mouth every evening.  02/14/17  Yes Katherine Roan, MD  Baclofen 5 MG TABS Take 5 mg by mouth 3 (three) times daily. 04/24/17  Yes Mariel Aloe, MD  bethanechol (URECHOLINE) 50 MG tablet Take 1 tablet (50 mg total) by mouth 3 (three) times daily. 02/14/17  Yes Katherine Roan, MD  DULoxetine (CYMBALTA) 30 MG capsule Take 1 capsule (30 mg total) by mouth daily. 07/05/17  Yes Velna Ochs, MD  feeding supplement, GLUCERNA SHAKE, (GLUCERNA SHAKE) LIQD Take 237 mLs by mouth 2 (two) times daily between meals. 02/14/17  Yes Katherine Roan, MD  glucagon 1 MG injection Inject 1 mg into the vein once as needed (for Hypoglycernia).   Yes [provider]  insulin aspart (NOVOLOG) 100 UNIT/ML injection Inject 4-14 Units into the skin 3 (three) times daily before meals. 150-200=4 units 201-250=6 units 241-300=8 units 301-350=10 units 351-400=12 units 401-450=14 units notify PCP if >451   Yes [provider]  insulin glargine (LANTUS) 100 UNIT/ML injection Inject 0.05 mLs (5 Units total) into the skin at bedtime. 07/05/17  Yes Velna Ochs, MD  Lactobacillus (ACIDOPHILUS) 100 MG CAPS Take 100 mg by mouth 2 (two) times daily.   Yes [provider]  lidocaine (LIDODERM) 5 % Place 1  patch onto the skin daily. FOR LOWER BACK PAIN Remove & Discard patch within 12 hours or as directed by MD   Yes [provider]  lisinopril (PRINIVIL,ZESTRIL) 20 MG tablet Take 1 tablet (20 mg total) by mouth daily. 02/15/17  Yes Katherine Roan, MD  loperamide (IMODIUM) 2 MG capsule Take 2 capsules (4 mg total) by mouth as needed for diarrhea or loose stools. 04/24/17  Yes Mariel Aloe, MD  magnesium hydroxide (MILK OF MAGNESIA) 400 MG/5ML suspension Take 30 mLs by mouth daily as needed for mild constipation. 02/14/17  Yes Katherine Roan, MD  meclizine (ANTIVERT) 12.5 MG tablet Take 12.5 mg by mouth 3 (three) times daily as needed for dizziness. per nsg. home MAR, pt. takes 1 tablet 1 hour prior to therapy.   Yes [provider]  metoprolol succinate (TOPROL-XL) 100 MG 24 hr tablet Take 1 tablet (100 mg total) by mouth daily. Take with or immediately following a meal. 02/15/17  Yes Winfrey, Jenne Pane, MD  mirtazapine (REMERON) 45 MG tablet Take 1 tablet (45 mg total) by mouth at bedtime. 04/24/17  Yes Mariel Aloe, MD  modafinil (PROVIGIL) 100 MG tablet Take 1 tablet (100 mg total) by mouth every morning. Patient taking differently: Take 100 mg by mouth daily.  07/05/17  Yes Velna Ochs, MD  Multiple Vitamin (MULTIVITAMIN) tablet Take 1 tablet by mouth daily.   Yes [provider]  ondansetron (ZOFRAN) 4 MG tablet Take 4 mg by mouth daily.   Yes [provider]  pantoprazole (PROTONIX) 40 MG tablet Take 1 tablet (40 mg total) by mouth daily. 02/15/17  Yes Katherine Roan, MD  polyethylene glycol (MIRALAX / GLYCOLAX) packet Take 17 g by mouth daily as needed for mild constipation, moderate constipation or severe constipation. 02/14/17  Yes Katherine Roan, MD  pregabalin (LYRICA) 25 MG capsule Take 25 mg by mouth every 8 (eight) hours.    Yes [provider]  tamsulosin (FLOMAX) 0.4 MG CAPS capsule Take 1 capsule (0.4 mg total) daily  by mouth. Patient taking differently: Take 0.4 mg by mouth daily after supper.  01/03/17  Yes Gouru, Illene Silver, MD  Teriflunomide 14 MG TABS Take 14 mg by mouth daily.   Yes [provider]  Vitamin D, Ergocalciferol, (DRISDOL) 50000 units CAPS capsule Take 1 capsule (50,000 Units total) by mouth every 7 (seven) days. Patient taking differently: Take 50,000 Units by mouth every 7 (seven) days. On Friday 02/15/17  Yes Katherine Roan, MD  aspirin EC 81 MG tablet Take 1 tablet (81 mg total) by mouth daily. Patient not taking: Reported on 07/16/2017 02/14/17   Katherine Roan, MD  collagenase (SANTYL) ointment Apply topically 2 (two) times daily. Patient not taking: Reported on 06/26/2017 05/25/17   Zada Finders, MD  doxycycline (VIBRAMYCIN) 50 MG capsule Take 2 capsules (100 mg total) by mouth 2 (two) times daily. Starting 6/17 08/13/17 11/11/17  Colbert Ewing, MD  HYDROmorphone (DILAUDID) 2 MG tablet Take 0.5 tablets (1 mg total) by mouth every 6 (six) hours as needed for up to 5 days for severe pain. 07/24/17 07/29/17  Lars Mage, MD  meropenem (MERREM) IVPB Inject 1 g into the vein every 8 (eight) hours for 19 days. Indication:  Sacral osteo Last Day of Therapy:  08/12/17 Labs - Once weekly:  CBC/D and BMP, Labs - Every other week:  ESR and CRP 07/24/17 08/12/17  Colbert Ewing, MD    Family History Family History  Problem Relation Age of Onset  . Hypertension Mother   . Kidney disease Mother   . Hypertension Father   . Breast cancer Maternal Grandmother   . Prostate cancer Maternal Grandfather   . Ovarian cancer Paternal Grandmother   . Prostate cancer Paternal Grandfather   . Colon cancer Maternal Uncle        Family history of malignant neoplasm of gastrointestinal tract    Social History Social History   Tobacco Use  . Smoking status: Never Smoker  . Smokeless tobacco: Never Used  Substance Use Topics  . Alcohol use: No    Alcohol/week: 0.0 oz  . Drug use: No     Comment: drug addict     Allergies   Penicillins; Lactose intolerance (gi); Pollen extract; and Tape   Review of Systems Review of Systems  Unable to perform ROS: Acuity of condition     Physical Exam Updated Vital Signs BP 130/68 (BP Location: Left Arm)   Pulse 72   Temp 98.2 F (36.8 C) (Oral)   Resp 18   Ht '5\' 6"'$  (1.676 m)   Wt 65.7 kg (144 lb 13.5 oz)   SpO2 100%   BMI 23.38 kg/m   Physical Exam  Constitutional:  Thin   HENT:  Head: Atraumatic.  Eyes:  2-30m equal, reactive  Neck: Neck supple.  Cardiovascular: Normal rate, S1 normal and S2 normal. Frequent extrasystoles are present.  Pulmonary/Chest: Bradypnea (very shallow respirations) noted.  Abdominal: Soft.  Musculoskeletal:  No deformity  Neurological: GCS eye subscore is 4. GCS verbal subscore is 1. GCS motor subscore is 1.  Skin: Skin is warm and dry.     ED Treatments / Results  Labs (all labs ordered are listed, but only abnormal results are displayed) Labs Reviewed  CULTURE, BLOOD (ROUTINE X 2) - Abnormal; Notable for the following components:      Result Value   Culture   (*)    Value: STAPHYLOCOCCUS SPECIES (COAGULASE NEGATIVE) THE SIGNIFICANCE OF ISOLATING THIS ORGANISM FROM A SINGLE SET OF BLOOD CULTURES  WHEN MULTIPLE SETS ARE DRAWN IS UNCERTAIN. PLEASE NOTIFY THE MICROBIOLOGY DEPARTMENT WITHIN ONE WEEK IF SPECIATION AND SENSITIVITIES ARE REQUIRED. Performed at [facility], [place] [address] [place], [place] [address]    All other components within normal limits  URINE CULTURE - Abnormal; Notable for the following components:   Culture >=100,000 COLONIES/mL YEAST (*)    All other components within normal limits  BLOOD CULTURE ID PANEL (REFLEXED) - Abnormal; Notable for the following components:   Staphylococcus species DETECTED (*)    Methicillin resistance DETECTED (*)    All other components within normal limits  COMPREHENSIVE METABOLIC PANEL - Abnormal; Notable for the  following components:   Potassium 2.9 (*)    Chloride 116 (*)    CO2 21 (*)    Creatinine, Ser 1.75 (*)    Calcium 8.5 (*)    Total Protein 5.4 (*)    Albumin 1.5 (*)    AST 43 (*)    ALT 12 (*)    Alkaline Phosphatase 224 (*)    GFR calc non Af Amer 33 (*)    GFR calc Af Amer 38 (*)    All other components within normal limits  CBC WITH DIFFERENTIAL/PLATELET - Abnormal; Notable for the following components:   WBC 11.8 (*)    Hemoglobin 10.7 (*)    HCT 33.5 (*)    RDW 17.5 (*)    Neutro Abs 8.0 (*)    All other components within normal limits  URINALYSIS, ROUTINE W REFLEX MICROSCOPIC - Abnormal; Notable for the following components:   APPearance TURBID (*)    Glucose, UA 50 (*)    Hgb urine dipstick MODERATE (*)    Ketones, ur 5 (*)    Protein, ur 100 (*)    Leukocytes, UA SMALL (*)    RBC / HPF >50 (*)    WBC, UA >50 (*)    Bacteria, UA MANY (*)    All other components within normal limits  TROPONIN I - Abnormal; Notable for the following components:   Troponin I 0.03 (*)    All other components within normal limits  TROPONIN I - Abnormal; Notable for the following components:   Troponin I 0.21 (*)    All other components within normal limits  TROPONIN I - Abnormal; Notable for the following components:   Troponin I 0.21 (*)    All other components within normal limits  GLUCOSE, CAPILLARY - Abnormal; Notable for the following components:   Glucose-Capillary 63 (*)    All other components within normal limits  GLUCOSE, CAPILLARY - Abnormal; Notable for the following components:   Glucose-Capillary 104 (*)    All other components within normal limits  GLUCOSE, CAPILLARY - Abnormal; Notable for the following components:   Glucose-Capillary 122 (*)    All other components within normal limits  BASIC METABOLIC PANEL - Abnormal; Notable for the following components:   Potassium 3.0 (*)    Chloride 117 (*)    CO2 17 (*)    Glucose, Bld 118 (*)    Creatinine, Ser 1.52  (*)    Calcium 7.6 (*)    GFR calc non Af Amer 39 (*)    GFR calc Af Amer 45 (*)    All other components within normal limits  GLUCOSE, CAPILLARY - Abnormal; Notable for the following components:   Glucose-Capillary 135 (*)    All other components within normal limits  GLUCOSE, CAPILLARY - Abnormal; Notable for the following components:  Glucose-Capillary 213 (*)    All other components within normal limits  GLUCOSE, CAPILLARY - Abnormal; Notable for the following components:   Glucose-Capillary 262 (*)    All other components within normal limits  GLUCOSE, CAPILLARY - Abnormal; Notable for the following components:   Glucose-Capillary 176 (*)    All other components within normal limits  GLUCOSE, CAPILLARY - Abnormal; Notable for the following components:   Glucose-Capillary 131 (*)    All other components within normal limits  BASIC METABOLIC PANEL - Abnormal; Notable for the following components:   Sodium 146 (*)    Chloride 124 (*)    CO2 18 (*)    Glucose, Bld 107 (*)    Creatinine, Ser 1.69 (*)    Calcium 7.7 (*)    GFR calc non Af Amer 34 (*)    GFR calc Af Amer 40 (*)    Anion gap 4 (*)    All other components within normal limits  CBC - Abnormal; Notable for the following components:   WBC 17.5 (*)    RBC 3.30 (*)    Hemoglobin 9.2 (*)    HCT 28.8 (*)    RDW 18.3 (*)    All other components within normal limits  PHOSPHORUS - Abnormal; Notable for the following components:   Phosphorus 1.6 (*)    All other components within normal limits  TROPONIN I - Abnormal; Notable for the following components:   Troponin I 0.07 (*)    All other components within normal limits  GLUCOSE, CAPILLARY - Abnormal; Notable for the following components:   Glucose-Capillary 109 (*)    All other components within normal limits  BASIC METABOLIC PANEL - Abnormal; Notable for the following components:   Sodium 147 (*)    Chloride 123 (*)    CO2 19 (*)    Glucose, Bld 133 (*)     Creatinine, Ser 1.56 (*)    Calcium 8.0 (*)    GFR calc non Af Amer 38 (*)    GFR calc Af Amer 44 (*)    All other components within normal limits  CBC - Abnormal; Notable for the following components:   WBC 15.5 (*)    RBC 3.24 (*)    Hemoglobin 8.9 (*)    HCT 28.4 (*)    RDW 18.6 (*)    All other components within normal limits  GLUCOSE, CAPILLARY - Abnormal; Notable for the following components:   Glucose-Capillary 153 (*)    All other components within normal limits  GLUCOSE, CAPILLARY - Abnormal; Notable for the following components:   Glucose-Capillary 128 (*)    All other components within normal limits  GLUCOSE, CAPILLARY - Abnormal; Notable for the following components:   Glucose-Capillary 103 (*)    All other components within normal limits  GLUCOSE, CAPILLARY - Abnormal; Notable for the following components:   Glucose-Capillary 154 (*)    All other components within normal limits  GLUCOSE, CAPILLARY - Abnormal; Notable for the following components:   Glucose-Capillary 145 (*)    All other components within normal limits  GLUCOSE, CAPILLARY - Abnormal; Notable for the following components:   Glucose-Capillary 111 (*)    All other components within normal limits  BASIC METABOLIC PANEL - Abnormal; Notable for the following components:   Sodium 149 (*)    Chloride 123 (*)    CO2 18 (*)    Glucose, Bld 126 (*)    Creatinine, Ser 1.32 (*)    Calcium  8.4 (*)    GFR calc non Af Amer 46 (*)    GFR calc Af Amer 53 (*)    All other components within normal limits  CBC - Abnormal; Notable for the following components:   WBC 12.8 (*)    RBC 3.16 (*)    Hemoglobin 8.7 (*)    HCT 28.0 (*)    RDW 18.4 (*)    All other components within normal limits  GLUCOSE, CAPILLARY - Abnormal; Notable for the following components:   Glucose-Capillary 128 (*)    All other components within normal limits  GLUCOSE, CAPILLARY - Abnormal; Notable for the following components:    Glucose-Capillary 101 (*)    All other components within normal limits  GLUCOSE, CAPILLARY - Abnormal; Notable for the following components:   Glucose-Capillary 138 (*)    All other components within normal limits  GLUCOSE, CAPILLARY - Abnormal; Notable for the following components:   Glucose-Capillary 119 (*)    All other components within normal limits  GLUCOSE, CAPILLARY - Abnormal; Notable for the following components:   Glucose-Capillary 148 (*)    All other components within normal limits  CBC - Abnormal; Notable for the following components:   RBC 3.09 (*)    Hemoglobin 8.5 (*)    HCT 26.8 (*)    RDW 18.5 (*)    All other components within normal limits  BASIC METABOLIC PANEL - Abnormal; Notable for the following components:   Chloride 117 (*)    CO2 20 (*)    Glucose, Bld 115 (*)    Creatinine, Ser 1.12 (*)    Calcium 8.3 (*)    GFR calc non Af Amer 56 (*)    All other components within normal limits  GLUCOSE, CAPILLARY - Abnormal; Notable for the following components:   Glucose-Capillary 148 (*)    All other components within normal limits  GLUCOSE, CAPILLARY - Abnormal; Notable for the following components:   Glucose-Capillary 119 (*)    All other components within normal limits  GLUCOSE, CAPILLARY - Abnormal; Notable for the following components:   Glucose-Capillary 174 (*)    All other components within normal limits  GLUCOSE, CAPILLARY - Abnormal; Notable for the following components:   Glucose-Capillary 181 (*)    All other components within normal limits  GLUCOSE, CAPILLARY - Abnormal; Notable for the following components:   Glucose-Capillary 129 (*)    All other components within normal limits  BASIC METABOLIC PANEL - Abnormal; Notable for the following components:   Chloride 117 (*)    CO2 18 (*)    Glucose, Bld 226 (*)    Creatinine, Ser 1.53 (*)    Calcium 8.3 (*)    GFR calc non Af Amer 39 (*)    GFR calc Af Amer 45 (*)    All other components  within normal limits  GLUCOSE, CAPILLARY - Abnormal; Notable for the following components:   Glucose-Capillary 142 (*)    All other components within normal limits  GLUCOSE, CAPILLARY - Abnormal; Notable for the following components:   Glucose-Capillary 169 (*)    All other components within normal limits  GLUCOSE, CAPILLARY - Abnormal; Notable for the following components:   Glucose-Capillary 279 (*)    All other components within normal limits  GLUCOSE, CAPILLARY - Abnormal; Notable for the following components:   Glucose-Capillary 262 (*)    All other components within normal limits  GLUCOSE, CAPILLARY - Abnormal; Notable for the following components:   Glucose-Capillary  149 (*)    All other components within normal limits  GLUCOSE, CAPILLARY - Abnormal; Notable for the following components:   Glucose-Capillary 155 (*)    All other components within normal limits  BASIC METABOLIC PANEL - Abnormal; Notable for the following components:   Chloride 119 (*)    CO2 14 (*)    Glucose, Bld 176 (*)    Creatinine, Ser 1.27 (*)    Calcium 8.5 (*)    GFR calc non Af Amer 48 (*)    GFR calc Af Amer 56 (*)    All other components within normal limits  GLUCOSE, CAPILLARY - Abnormal; Notable for the following components:   Glucose-Capillary 155 (*)    All other components within normal limits  GLUCOSE, CAPILLARY - Abnormal; Notable for the following components:   Glucose-Capillary 108 (*)    All other components within normal limits  GLUCOSE, CAPILLARY - Abnormal; Notable for the following components:   Glucose-Capillary 137 (*)    All other components within normal limits  GLUCOSE, CAPILLARY - Abnormal; Notable for the following components:   Glucose-Capillary 218 (*)    All other components within normal limits  GLUCOSE, CAPILLARY - Abnormal; Notable for the following components:   Glucose-Capillary 163 (*)    All other components within normal limits  GLUCOSE, CAPILLARY - Abnormal;  Notable for the following components:   Glucose-Capillary 127 (*)    All other components within normal limits  GLUCOSE, CAPILLARY - Abnormal; Notable for the following components:   Glucose-Capillary 114 (*)    All other components within normal limits  GLUCOSE, CAPILLARY - Abnormal; Notable for the following components:   Glucose-Capillary 210 (*)    All other components within normal limits  GLUCOSE, CAPILLARY - Abnormal; Notable for the following components:   Glucose-Capillary 220 (*)    All other components within normal limits  GLUCOSE, CAPILLARY - Abnormal; Notable for the following components:   Glucose-Capillary 122 (*)    All other components within normal limits  I-STAT CG4 LACTIC ACID, ED - Abnormal; Notable for the following components:   Lactic Acid, Venous 1.94 (*)    All other components within normal limits  I-STAT ARTERIAL BLOOD GAS, ED - Abnormal; Notable for the following components:   pH, Arterial 7.306 (*)    pO2, Arterial 23.0 (*)    Acid-base deficit 4.0 (*)    All other components within normal limits  I-STAT ARTERIAL BLOOD GAS, ED - Abnormal; Notable for the following components:   pO2, Arterial 601.0 (*)    Acid-base deficit 3.0 (*)    All other components within normal limits  POCT I-STAT 3, ART BLOOD GAS (G3+) - Abnormal; Notable for the following components:   pH, Arterial 7.316 (*)    pO2, Arterial 217.0 (*)    Bicarbonate 16.6 (*)    TCO2 18 (*)    Acid-base deficit 9.0 (*)    All other components within normal limits  CULTURE, BLOOD (ROUTINE X 2)  CULTURE, BLOOD (SINGLE)  MRSA PCR SCREENING  CULTURE, BLOOD (ROUTINE X 2)  CULTURE, BLOOD (ROUTINE X 2)  PROCALCITONIN  LACTIC ACID, PLASMA  MAGNESIUM  PHOSPHORUS  CORTISOL  MAGNESIUM  GLUCOSE, CAPILLARY  PROCALCITONIN  GLUCOSE, CAPILLARY  MAGNESIUM  PROCALCITONIN  MAGNESIUM  MAGNESIUM  PHOSPHORUS  GLUCOSE, CAPILLARY  PHOSPHORUS  MAGNESIUM  GLUCOSE, CAPILLARY    EKG EKG  Interpretation  Date/Time:  Sunday Jul 15 2017 23:28:32 EDT Ventricular Rate:  93 PR Interval:  QRS Duration: 105 QT Interval:  423 QTC Calculation: 405 R Axis:   49 Text Interpretation:  Sinus rhythm Paired ventricular premature complexes Low voltage, extremity leads Borderline repolarization abnormality Confirmed by Orpah Greek [phone]) on 07/16/2017 12:37:06 AM   Radiology [place] Ekg Site Rite  Result Date: 07/24/2017 If Site Rite image not attached, placement could not be confirmed due to current cardiac rhythm.   Procedures Procedure Name: Intubation Date/Time: 07/16/2017 11:10 PM Performed by: Orpah Greek, MD Pre-anesthesia Checklist: Patient identified, Patient being monitored, Emergency Drugs available, Timeout performed and Suction available Oxygen Delivery Method: Ambu bag Preoxygenation: Pre-oxygenation with 100% oxygen Induction Type: Rapid sequence Ventilation: Mask ventilation without difficulty Laryngoscope Size: Glidescope and 3 Grade View: Grade I Tube size: 7.5 mm Number of attempts: 1 Placement Confirmation: ETT inserted through vocal cords under direct vision,  CO2 detector and Breath sounds checked- equal and bilateral Secured at: 22 cm Tube secured with: ETT holder Dental Injury: Teeth and Oropharynx as per pre-operative assessment  Future Recommendations: Recommend- induction with short-acting agent, and alternative techniques readily available Comments: Small amount of vomit in airway    .Central Line Date/Time: 07/16/2017 11:45 PM Performed by: Orpah Greek, MD Authorized by: Orpah Greek, MD   Consent:    Consent obtained:  Emergent situation Universal protocol:    Relevant documents present and verified: yes     Test results available and properly labeled: yes     Imaging studies available: yes     Required blood products, implants, devices, and special equipment available: yes     Site/side marked: yes      Immediately prior to procedure, a time out was called: yes     Patient identity confirmed:  Hospital-assigned identification number Pre-procedure details:    Hand hygiene: Hand hygiene performed prior to insertion     Sterile barrier technique: All elements of maximal sterile technique followed     Skin preparation:  2% chlorhexidine   Skin preparation agent: Skin preparation agent completely dried prior to procedure   Anesthesia (see MAR for exact dosages):    Anesthesia method:  None Procedure details:    Location:  R internal jugular   Patient position:  Flat   Procedural supplies:  Triple lumen   Landmarks identified: yes     Ultrasound guidance: yes     Sterile ultrasound techniques: Sterile gel and sterile probe covers were used     Number of attempts:  2   Successful placement: yes   Post-procedure details:    Post-procedure:  Dressing applied and line sutured   Assessment:  Blood return through all ports, no pneumothorax on x-ray, placement verified by x-ray and free fluid flow   Patient tolerance of procedure:  Tolerated well, no immediate complications Comments:     Note: initially attempted non-ultrasound guided triple lumen in right groin. Femoral artery palpated and introducer needle placed 1cm medial to artery at inguinal crease , advanced with constant aspiration. Gross pus aspirated. Pus resembles the urine that was obtained after changing out foley catheter - ? Bladder distended due to blocked foley and inadvertently tapped.   (including critical care time)  Medications Ordered in ED Medications  meropenem (MERREM) 1 g in sodium chloride 0.9 % 100 mL IVPB (0 g Intravenous Stopped 07/22/17 1434)  magnesium chloride (SLOW-MAG) 64 MG SR tablet 64 mg (64 mg Oral Given 07/23/17 1250)  etomidate (AMIDATE) injection 20 mg (20 mg Intravenous Given 07/15/17 2311)  succinylcholine (ANECTINE) injection  100 mg (100 mg Intravenous Given 07/15/17 2311)  norepinephrine (LEVOPHED)  '4mg'$  in D5W 2109m premix infusion (0 mcg/min Intravenous Hold 07/15/17 2335)  aztreonam (AZACTAM) 2 g in sodium chloride 0.9 % 100 mL IVPB (0 g Intravenous Stopped 07/16/17 0300)  sodium chloride 0.9 % bolus 1,000 mL (0 mLs Intravenous Stopped 07/16/17 0132)    And  sodium chloride 0.9 % bolus 500 mL (0 mLs Intravenous Stopped 07/16/17 0133)    And  sodium chloride 0.9 % bolus 250 mL (250 mLs Intravenous Bolus from Bag 07/16/17 0100)  potassium chloride 10 mEq in 50 mL *CENTRAL LINE* IVPB (0 mEq Intravenous Stopped 07/16/17 0724)  potassium chloride 20 MEQ/15ML (10%) solution 40 mEq (40 mEq Oral Given 07/16/17 0351)  magnesium sulfate IVPB 2 g 50 mL (0 g Intravenous Stopped 07/16/17 0506)  sodium chloride 0.9 % bolus 1,000 mL (0 mLs Intravenous Stopped 07/16/17 0430)  dextrose 50 % solution (25 mLs  Given 07/16/17 0348)  sodium chloride 0.9 % bolus 1,000 mL (0 mLs Intravenous Stopped 07/16/17 1013)  sodium chloride 0.9 % bolus 1,000 mL (0 mLs Intravenous Stopped 07/16/17 2321)  sodium chloride 0.9 % bolus 1,000 mL (0 mLs Intravenous Stopped 07/17/17 1311)  fluconazole (DIFLUCAN) IVPB 400 mg (0 mg Intravenous Stopped 07/17/17 1620)  sodium glycerophosphate (GLYCOPHOS) 20 mmol in sodium chloride 0.9 % 250 mL infusion (0 mmol Intravenous Stopped 07/17/17 2320)  hydrALAZINE (APRESOLINE) injection 10 mg (10 mg Intravenous Given 07/18/17 1424)  fluconazole (DIFLUCAN) tablet 200 mg (200 mg Oral Given 07/23/17 0842)     Initial Impression / Assessment and Plan / ED Course  I have reviewed the triage vital signs and the nursing notes.  Pertinent labs & imaging results that were available during my care of the patient were reviewed by me and considered in my medical decision making (see chart for details).     Presented to the emergency department hypotensive, unresponsive.  She is hypothermic and sepsis is felt to be the cause of her current condition.  She was not protecting her airway and aspiration was felt to  have already been likely prior to arrival.  Respirations were weak and therefore she was intubated.  Intubation was performed without difficulty.  Patient was very hypotensive at arrival.  She was started on fluid bolus and norepinephrine drip.  As she was administered more fluid nor epi was titrated down.  Her pressures have remained stable.  Patient started on broad-spectrum antibiotics.  Decision was made to place a central line for access.  I initially attempted to place a right femoral vein catheter, but after identifying landmarks and advancing the needle I suspect that I inadvertently tapped the bladder.  This was then terminated and a right internal jugular central line was placed with ultrasound guidance without difficulty.  CRITICAL CARE Performed by: POrpah Greek  Total critical care time: 35 minutes  Critical care time was exclusive of separately billable procedures and treating other patients.  Critical care was necessary to treat or prevent imminent or life-threatening deterioration.  Critical care was time spent personally by me on the following activities: development of treatment plan with patient and/or surrogate as well as nursing, discussions with consultants, evaluation of patient's response to treatment, examination of patient, obtaining history from patient or surrogate, ordering and performing treatments and interventions, ordering and review of laboratory studies, ordering and review of radiographic studies, pulse oximetry and re-evaluation of patient's condition.   Final Clinical Impressions(s) / ED Diagnoses  Final diagnoses:  Abdominal pain  Respiratory failure ([place])       Orpah Greek, MD 07/26/17 [phone]

## 2017-07-16 NOTE — Progress Notes (Signed)
eLink Physician-Brief Progress Note Patient Name: Debra [name] DOB: 22-Jun-1965 MRN: [id number]   Date of Service  07/16/2017  HPI/Events of Note  CVP = 7 and LVEF = 60% to 65%.  eICU Interventions  Will bolus with 0.9 NaCl 1 liter IV over 1 hour now.      Intervention Category Major Interventions: Hypotension - evaluation and management  [name] 07/16/2017, 3:21 AM

## 2017-07-16 NOTE — Progress Notes (Signed)
Pt noted with no urine from foley. Bladder scan showed 750 cc. Irrigated foley and thick, tan, mucus urine came out. 700 cc UO milked from foley.

## 2017-07-16 NOTE — H&P (Addendum)
PULMONARY / CRITICAL CARE MEDICINE   Name: Debra [name] MRN: [id number] DOB: [birth year]    ADMISSION DATE:  07/15/2017 CONSULTATION DATE:  07/16/2017  REFERRING MD:  Dr. Blinda Leatherwood   CHIEF COMPLAINT:  AMS/Sepsis   HISTORY OF PRESENT ILLNESS:   52 year old female with PMH of MS, Neurogenic bladder, DM, Frequent UTI, Sacral ulcer with known osteomyelitis whom resides in SNF  Recent admission 3/9-3/29 for VRE +Proteus Bacteremia, Abscess in gluteal muscles with osteo and hyperglycemia Recent admission 4/30-5/9 with Methicillin Resistant CoNS Bacteremia suspected due to PICC, with sacral wound infection   Presents to ED 5/19 from SNF with AMS. Family reports that Friday patient seemed more lethargic. Today patient was found unresponsive. When EMS arrived BP 75/50 with temp 94.2. Upon arrival to ED patient intubated. Urine noted to be thick with sediment. Given 1.75L of NS. PCCM asked to admit.   PAST MEDICAL HISTORY :  She  has a past medical history of Adenomatous colonic polyps, Anemia, Anxiety, Arthritis, Asthma, Benign essential HTN, Cataract, Chronic lower back pain, Decubital ulcer, Depression with psychotic symptoms, Diabetic ketoacidosis without coma associated with type 1 diabetes mellitus (HCC), Difficult intubation, Fibromyalgia (11/07/2016), Gastroesophageal Reflux Disease (GERD), Heart murmur, Hyperlipidemia, Hypertension, Internal hemorrhoids (04/24/06), Migraine, Multiple sclerosis (HCC), Neuropathic bladder, Neuropathy of the hands & feet, Restless Leg Syndrome, Sepsis due to urinary tract infection (HCC) (04/12/2017), Sleep paralysis, Stable angina pectoris, Stroke (1990), and Type I Diabetes Mellitus (1988).  PAST SURGICAL HISTORY: She  has a past surgical history that includes Trigger finger release; Laparoscopic tubal ligation (12/29/2010); IUD removal (12/29/2010); Hysteroscopy (12/29/2010); Dilation and curettage of uterus (12/29/2010); Endometrial ablation w/ novasure (N/A,  12/2009); Breast surgery (Left); Colonoscopy w/ biopsies and polypectomy; ORIF ankle fracture (Right, 10/09/2013); Foot surgery (Bilateral); Tubal ligation; Laparoscopic cholecystectomy; Fracture surgery; Eye surgery (Bilateral); Cardiac catheterization (~ 2007/2008); and Debridment of decubitus ulcer (N/A, 05/21/2017).  Allergies  Allergen Reactions  . Penicillins Anaphylaxis, Nausea And Vomiting and Rash    Has patient had a PCN reaction causing immediate rash, facial/tongue/throat swelling, SOB or lightheadedness with hypotension: Yes Has patient had a PCN reaction causing severe rash involving mucus membranes or skin necrosis: Yes Has patient had a PCN reaction that required hospitalization No Has patient had a PCN reaction occurring within the last 10 years: Yes If all of the above answers are "NO", then may proceed with Cephalosporin use.   . Lactose Intolerance (Gi) Other (See Comments)  . Pollen Extract Other (See Comments)    Seasonal Allergies  . Tape Rash    No current facility-administered medications on file prior to encounter.    Current Outpatient Medications on File Prior to Encounter  Medication Sig  . acetaminophen (TYLENOL) 325 MG tablet Take 650 mg by mouth every 4 (four) hours as needed for mild pain or fever.   . Amino Acids-Protein Hydrolys (FEEDING SUPPLEMENT, PRO-STAT SUGAR FREE 64,) LIQD Take 30 mLs by mouth 3 (three) times daily between meals.  Marland Kitchen amLODipine (NORVASC) 10 MG tablet Take 1 tablet (10 mg total) by mouth daily.  Marland Kitchen atorvastatin (LIPITOR) 40 MG tablet Take 1 tablet (40 mg total) by mouth daily. (Patient taking differently: Take 40 mg by mouth every evening. )  . Baclofen 5 MG TABS Take 5 mg by mouth 3 (three) times daily.  . bethanechol (URECHOLINE) 50 MG tablet Take 1 tablet (50 mg total) by mouth 3 (three) times daily.  Marland Kitchen doxycycline (VIBRAMYCIN) 50 MG capsule Take 2 capsules (100 mg total) by  mouth 2 (two) times daily. Starting 5/17  . DULoxetine  (CYMBALTA) 30 MG capsule Take 1 capsule (30 mg total) by mouth daily.  . feeding supplement, GLUCERNA SHAKE, (GLUCERNA SHAKE) LIQD Take 237 mLs by mouth 2 (two) times daily between meals.  Marland Kitchen glucagon 1 MG injection Inject 1 mg into the vein once as needed (for Hypoglycernia).  . insulin aspart (NOVOLOG) 100 UNIT/ML injection Inject 4-14 Units into the skin 3 (three) times daily before meals. 150-200=4 units 201-250=6 units 241-300=8 units 301-350=10 units 351-400=12 units 401-450=14 units notify PCP if >451  . insulin detemir (LEVEMIR) 100 UNIT/ML injection Inject 10 Units into the skin daily.  . insulin glargine (LANTUS) 100 UNIT/ML injection Inject 0.05 mLs (5 Units total) into the skin at bedtime.  . Lactobacillus (ACIDOPHILUS) 100 MG CAPS Take 100 mg by mouth 2 (two) times daily.  Marland Kitchen lidocaine (LIDODERM) 5 % Place 1 patch onto the skin daily. FOR LOWER BACK PAIN Remove & Discard patch within 12 hours or as directed by MD  . lisinopril (PRINIVIL,ZESTRIL) 20 MG tablet Take 1 tablet (20 mg total) by mouth daily.  Marland Kitchen loperamide (IMODIUM) 2 MG capsule Take 2 capsules (4 mg total) by mouth as needed for diarrhea or loose stools.  . magnesium hydroxide (MILK OF MAGNESIA) 400 MG/5ML suspension Take 30 mLs by mouth daily as needed for mild constipation.  . meclizine (ANTIVERT) 12.5 MG tablet Take 12.5 mg by mouth 3 (three) times daily as needed for dizziness. per nsg. home MAR, pt. takes 1 tablet 1 hour prior to therapy.  . metoprolol succinate (TOPROL-XL) 100 MG 24 hr tablet Take 1 tablet (100 mg total) by mouth daily. Take with or immediately following a meal.  . mirtazapine (REMERON) 45 MG tablet Take 1 tablet (45 mg total) by mouth at bedtime.  . modafinil (PROVIGIL) 100 MG tablet Take 1 tablet (100 mg total) by mouth every morning. (Patient taking differently: Take 100 mg by mouth daily. )  . Multiple Vitamin (MULTIVITAMIN) tablet Take 1 tablet by mouth daily.  . ondansetron (ZOFRAN) 4 MG tablet  Take 4 mg by mouth daily.  . pantoprazole (PROTONIX) 40 MG tablet Take 1 tablet (40 mg total) by mouth daily.  . polyethylene glycol (MIRALAX / GLYCOLAX) packet Take 17 g by mouth daily as needed for mild constipation, moderate constipation or severe constipation.  . pregabalin (LYRICA) 25 MG capsule Take 25 mg by mouth every 8 (eight) hours.   . tamsulosin (FLOMAX) 0.4 MG CAPS capsule Take 1 capsule (0.4 mg total) daily by mouth. (Patient taking differently: Take 0.4 mg by mouth daily after supper. )  . Teriflunomide 14 MG TABS Take 14 mg by mouth daily.  . Vitamin D, Ergocalciferol, (DRISDOL) 50000 units CAPS capsule Take 1 capsule (50,000 Units total) by mouth every 7 (seven) days. (Patient taking differently: Take 50,000 Units by mouth every 7 (seven) days. On Friday)  . aspirin EC 81 MG tablet Take 1 tablet (81 mg total) by mouth daily. (Patient not taking: Reported on 07/16/2017)  . collagenase (SANTYL) ointment Apply topically 2 (two) times daily. (Patient not taking: Reported on 06/26/2017)  . protein supplement shake (PREMIER PROTEIN) LIQD Take 325 mLs (11 oz total) by mouth 2 (two) times daily between meals. (Patient not taking: Reported on 06/13/2017)    FAMILY HISTORY:  Her indicated that her mother is deceased. She indicated that her father is alive. She indicated that her maternal grandmother is alive. She indicated that her maternal grandfather is  deceased. She indicated that her paternal grandmother is deceased. She indicated that her paternal grandfather is deceased. She indicated that the status of her maternal uncle is unknown.   SOCIAL HISTORY: She  reports that she has never smoked. She has never used smokeless tobacco. She reports that she does not drink alcohol or use drugs.  REVIEW OF SYSTEMS:   Unable to review as patient is intubated and sedated   SUBJECTIVE:   VITAL SIGNS: BP (!) 55/45   Pulse 67   Resp 14   Ht 5\' 6"  (1.676 m)   SpO2 100%   BMI 19.78 kg/m    HEMODYNAMICS:    VENTILATOR SETTINGS: Vent Mode: PRVC FiO2 (%):  [100 %] 100 % Set Rate:  [18 bmp] 18 bmp Vt Set:  [480 mL] 480 mL PEEP:  [5 cmH20] 5 cmH20 Plateau Pressure:  [15 cmH20] 15 cmH20  INTAKE / OUTPUT: No intake/output data recorded.  PHYSICAL EXAMINATION: General:  Chronically ill adult female, on vent  Neuro:  Opens eyes, does not follow command HEENT:  ETT in place  Cardiovascular:  [place] Foley, no MRG  Lungs:  Clear breath sounds, no wheeze/crackles  Abdomen:  Non-distended, active bowel sounds  Musculoskeletal:  -edema  Skin:  Noted healing wounds to bilateral heals   LABS:  BMET No results for input(s): NA, K, CL, CO2, BUN, CREATININE, GLUCOSE in the last 168 hours.  Electrolytes No results for input(s): CALCIUM, MG, PHOS in the last 168 hours.  CBC No results for input(s): WBC, HGB, HCT, PLT in the last 168 hours.  Coag's No results for input(s): APTT, INR in the last 168 hours.  Sepsis Markers No results for input(s): LATICACIDVEN, PROCALCITON, O2SATVEN in the last 168 hours.  ABG No results for input(s): PHART, PCO2ART, PO2ART in the last 168 hours.  Liver Enzymes No results for input(s): AST, ALT, ALKPHOS, BILITOT, ALBUMIN in the last 168 hours.  Cardiac Enzymes No results for input(s): TROPONINI, PROBNP in the last 168 hours.  Glucose No results for input(s): GLUCAP in the last 168 hours.  Imaging Dg Chest Port 1 View  Result Date: 07/16/2017 CLINICAL DATA:  Unresponsive EXAM: PORTABLE CHEST 1 VIEW COMPARISON:  06/28/2017 FINDINGS: Endotracheal tube tip is about 2.2 cm superior to the carina. Right central venous catheter tip overlies the SVC. Right upper extremity catheter tip seen to the cavoatrial region. Esophageal tube tip is in the left upper quadrant no acute opacity or pleural effusion. Borderline cardiomegaly. No pneumothorax. IMPRESSION: Endotracheal tube tip about 2.2 cm superior to the carina. Other support lines and tubes as  above. Clear lung fields. Electronically Signed   By: Jasmine Pang M.D.   On: 07/16/2017 00:14     STUDIES:  CXR 5/20 > Endotracheal tube tip is about 2.2 cm superior to the carina. Right central venous catheter tip overlies the SVC. Right upper extremity catheter tip seen to the cavoatrial region. Esophageal tube tip is in the left upper quadrant no acute opacity or pleural effusion. Borderline cardiomegaly. No pneumothorax  CULTURES: Blood 5/20 >> Tracheal Asp 5/20 >> Urine 5/20 >>  ANTIBIOTICS: Linzolid 5/20 >> Meropenem 5/20 >>   SIGNIFICANT EVENTS: 5/20 > Presents to ED   LINES/TUBES: ETT 5/20 >> Right IJ 5/20 >>   DISCUSSION: 52 year old female whom resides in SNF presents in septic shock with presumed urosepsis   ASSESSMENT / PLAN:  PULMONARY A: Respiratory Insufficieny secondary to Encephalopathy in setting of septic shock P:   Vent Support >  TV 8cc/kg  Trend ABG/CXR Pulmonary Hygiene   CARDIOVASCULAR A:  Septic Shock  H/O Diastolic Heart Failure (G1DD, EF 60-65)  P:  Cardiac Monitoring  Maintain MAP >65 Trend Troponin   RENAL A:   Anion Gap Metabolic Acidosis with Lactic Acidosis  H/O Neurogenic bladder, chronic foley  P:   Trend BMP > BMP pending  Trend LA  Replace electrolytes as indicated  NS @ 75 ml/hr   GASTROINTESTINAL A:   SUP H/O Dysphagia  P:   NPO PPI   HEMATOLOGIC A:   Anemia  P:  Trend CBC  Heparin SQ for VTE  INFECTIOUS A:   Septic Shock > Urosepsis vs Osteomyelitis (wounds look good)  H/O VRE +Proteus Bacteremia, Methicillin Resistant CoNS P:   Trend WBC and Fever Curve Trend PCT and LA  Follow Culture Data  Meropenem, Linezolid   ENDOCRINE A:   H/O DM    P:   Trend glucose SSI  Cortisol pending   NEUROLOGIC A:   Acute Metabolic Encephalopathy in setting of septic shock  H/O MS, Depression  P:   RASS goal: 0/-1 Wean Fentanyl gtt to achieve RASS PRN Versed  Daily SBT  Head CT pending   FAMILY   - Updates: Family updated at bedside   - Inter-disciplinary family meet or Palliative Care meeting due by: 07/23/2017    Jovita Kussmaul, AGACNP-BC [facility]  Pgr: [phone]  PCCM Pgr: [phone]

## 2017-07-16 NOTE — Progress Notes (Signed)
Patient transported on vent from ED to CT and back without complications. 

## 2017-07-16 NOTE — Sepsis Progress Note (Signed)
Critical Care Attending  Seen early this morning for sepsis likely secondary to pyelonephritis.   Presently:  CNS: hypoactive delirium. Not following commands Resp: respiratory failure on nominal ventilator settings. No dyssynchrony. Chest clear CVS: in septic shock currently MAP 55 on NE. CVP only 2.  Has received 3L of fluid resuscitation. Looks dry. Continue to titrate Levo and will bolus 1 L. GI: will start trophic feeds Renal: frankly purulent urine. Cultures pending. Will image urinary system to rule out abscess.  Lactate has clear with initial resuscitation.  Endocrine: hypoglycemia initially requiring D5W due to severe malnutrition. Start feeds.  CRITICAL CARE Performed by: Lynnell Catalan   Total critical care time: 30 minutes  Critical care time was exclusive of separately billable procedures and treating other patients.  Critical care was necessary to treat or prevent imminent or life-threatening deterioration.  Critical care was time spent personally by me on the following activities: development of treatment plan with patient and/or surrogate as well as nursing, discussions with consultants, evaluation of patient's response to treatment, examination of patient, obtaining history from patient or surrogate, ordering and performing treatments and interventions, ordering and review of laboratory studies, ordering and review of radiographic studies, pulse oximetry and re-evaluation of patient's condition.  Lynnell Catalan, MD

## 2017-07-16 NOTE — Progress Notes (Signed)
eLink Physician-Brief Progress Note Patient Name: Debra [name] DOB: 02-Feb-1966 MRN: [id number]   Date of Service  07/16/2017  HPI/Events of Note  Hypotension - Patient needs order for Norepinephrine IV infusion.   eICU Interventions  Will order: 1. Norepinephrine IV infusion. Titrate to MAP > 65.  2. Monitor CVP.     Intervention Category Major Interventions: Hypotension - evaluation and management  Sommer,Steven Eugene 07/16/2017, 3:14 AM

## 2017-07-16 NOTE — Procedures (Signed)
Arterial Catheter Insertion Procedure Note Debra [name] [id number] September 27, 1965  Procedure: Insertion of Arterial Catheter  Indications: Blood pressure monitoring and Frequent blood sampling  Procedure Details Consent: Unable to obtain consent because of altered level of consciousness. Time Out: Verified patient identification, verified procedure, site/side was marked, verified correct patient position, special equipment/implants available, medications/allergies/relevent history reviewed, required imaging and test results available.  Performed Assisted by Epifanio Lesches RRT,RCP Maximum sterile technique was used including antiseptics, cap, gloves, gown, hand hygiene, mask and sheet. Skin prep: Chlorhexidine; local anesthetic administered 20 gauge catheter was inserted into left radial artery using the Seldinger technique. ULTRASOUND GUIDANCE USED: NO Evaluation Blood flow good; BP tracing good. Complications: No apparent complications.   Vilinda Blanks, [name] 07/16/2017

## 2017-07-16 NOTE — ED Notes (Signed)
Warm blankets placed at this time.

## 2017-07-16 NOTE — Progress Notes (Signed)
eLink Physician-Brief Progress Note Patient Name: Debra [name] DOB: 12-25-1965 MRN: [id number]   Date of Service  07/16/2017  HPI/Events of Note  Hypotension - BP = 147/84, however, Norepinephrine IV infusion is required for hemodynamic support. CVP = 4.   eICU Interventions  Will bolus with 0.9 NaCl 1 liter IV over 1 hour now.      Intervention Category Major Interventions: Hypotension - evaluation and management  Sommer,Steven Eugene 07/16/2017, 10:10 PM

## 2017-07-16 NOTE — Progress Notes (Signed)
Initial Nutrition Assessment  DOCUMENTATION CODES:   Non-severe (moderate) malnutrition in context of chronic illness  INTERVENTION:   Per MD, trophic feeds at this time: - Vital AF 1.2 @ 20 ml/hr (480 ml/day)  Tube feeding regimen provides 576 kcal, 36 grams of protein, and 389 ml of H2O (meeting 44% of pt's estimated energy needs and 42% of pt's estimated protein needs).  RD recommends increasing tube feeds to goal rate as tolerated. Goal: Vital AF 1.2 @ 40 ml/hr (960 ml/day) with 30 ml Pro-stat daily  Goal tube feeding regimen provides 1252 kcal, 87 grams of protein, and 778 ml of H2O (meeting 96% of pt's estimated energy needs and 100% of pt's estimated protein needs).  NUTRITION DIAGNOSIS:   Moderate Malnutrition related to chronic illness(MS, type 1 diabetes mellitus) as evidenced by percent weight loss, mild fat depletion, moderate fat depletion, moderate muscle depletion(26% weight loss in 7 months).  GOAL:   Patient will meet greater than or equal to 90% of their needs  MONITOR:   PO intake, Labs, Vent status, TF tolerance, Skin, Weight trends  REASON FOR ASSESSMENT:   Ventilator, Consult Enteral/tube feeding initiation and management  ASSESSMENT:   52 year old pt who presented to the ED from SNF after being found unresponsive by staff. Pt presented in septic shock. PMH significant for MS and chronically bed-bound at baseline, neurogenic bladder with chronic foley, recurrent UTI, CVA, CAD, type 1 diabetes mellitus, hypertension, hyperlipidemia, chronic sacral wound with known osteomyelitis, and GERD.  5/19 - intubated by ED physician  Patient is currently intubated on ventilator support. MV: 8.9 L/min Temp (24hrs), Avg:94 F (34.4 C), Min:80 F (26.7 C), Max:97.7 F (36.5 C)  Propofol: none Levophed: 1.4 ml/hr  Discussed pt with RN.  Per MD this morning, plan to start trophic feeds. RD to order trophic feeds and leave recommendations for goal rate. Vital  High Protein infusing via OG tube @ 20 ml/hr at RD time of visit.  No family present at time of visit. Unable to obtain more detailed weight and diet history at this time. Suspect pt with severe protein-calorie malnutrition due to significant weight loss but unable to confirm without history regarding PO intake.  Per weight history in chart, pt has lost 44 lbs since October. This corresponds to a 26% weight loss in 7 months which is significant for timeframe.  Medications reviewed and include: 100 mg Colace BID, sliding scale Novolog, 40 mg Protonix daily  Labs reviewed: potassium 2.9 (L), chloride 116 (H), CO2 21 (L), creatinine 1.75 (H), hemoglobin 10.7 (L), HCT 33.5 (L) CBG's: 122, 105, 63, 87 x 24 hours  UOP: 150 ml since admission I/O's: +3.3 L since admission  NUTRITION - FOCUSED PHYSICAL EXAM:    Most Recent Value  Orbital Region  Mild depletion  Upper Arm Region  No depletion  Thoracic and Lumbar Region  Moderate depletion  Buccal Region  Unable to assess  Temple Region  Moderate depletion  Clavicle Bone Region  No depletion  Clavicle and Acromion Bone Region  No depletion  Scapular Bone Region  Unable to assess  Dorsal Hand  No depletion  Patellar Region  Moderate depletion  Anterior Thigh Region  Moderate depletion  Posterior Calf Region  Unable to assess  Edema (RD Assessment)  Unable to assess  Hair  Reviewed  Eyes  Unable to assess  Mouth  Unable to assess  Skin  Reviewed  Nails  Reviewed       Diet Order:   Diet  Order           Diet NPO time specified  Diet effective now          EDUCATION NEEDS:   Not appropriate for education at this time  Skin:  Skin Assessment: Skin Integrity Issues: Skin Integrity Issues:: Stage IV, Stage I, Other (Comment), Diabetic Ulcer Stage I: L foot, R heel Stage IV: sacrum Diabetic Ulcer: L leg Other: non-pressure wound to buttocks  Last BM:  07/16/17 medium type 6  Height:   Ht Readings from Last 1 Encounters:   07/16/17 5\' 6"  (1.676 m)    Weight:   Wt Readings from Last 1 Encounters:  07/16/17 124 lb 5.4 oz (56.4 kg)    Ideal Body Weight:  59.1 kg  BMI:  Body mass index is 20.07 kg/m.  Estimated Nutritional Needs:   Kcal:  1299 kcal/day (PSU 2003b)  Protein:  85-100 grams/day  Fluid:  >/= 1.7 L/day    Earma Reading, MS, RD, LDN Pager: [phone] Weekend/After Hours: [phone]

## 2017-07-16 NOTE — Progress Notes (Signed)
A-line placed with blood flow but no tracing on monitor. ABG results confirmed venous return. MD aware.

## 2017-07-16 NOTE — Progress Notes (Signed)
Patient transported on vent from ED to 55M-02 without complication.

## 2017-07-16 NOTE — Progress Notes (Signed)
eLink Physician-Brief Progress Note Patient Name: Debra [name] DOB: 1965/06/25 MRN: [id number]   Date of Service  07/16/2017  HPI/Events of Note  Hypoglycemia - Blood glucose = 63.   eICU Interventions  Will change IV fluid to D5 0.9 NaCl to run IV at 75 mL/hour.      Intervention Category Major Interventions: Other:  Sommer,Steven Dennard Nip 07/16/2017, 4:26 AM

## 2017-07-17 ENCOUNTER — Inpatient Hospital Stay (HOSPITAL_COMMUNITY): Payer: Medicaid Other

## 2017-07-17 DIAGNOSIS — E440 Moderate protein-calorie malnutrition: Secondary | ICD-10-CM

## 2017-07-17 LAB — TROPONIN I: TROPONIN I: 0.07 ng/mL — AB (ref <0.03)

## 2017-07-17 LAB — BASIC METABOLIC PANEL
Anion gap: 4 — ABNORMAL LOW (ref 5–15)
BUN: 17 mg/dL (ref 6–20)
CO2: 18 mmol/L — AB (ref 22–32)
CREATININE: 1.69 mg/dL — AB (ref 0.44–1.00)
Calcium: 7.7 mg/dL — ABNORMAL LOW (ref 8.9–10.3)
Chloride: 124 mmol/L — ABNORMAL HIGH (ref 101–111)
GFR calc Af Amer: 40 mL/min — ABNORMAL LOW (ref >60)
GFR calc non Af Amer: 34 mL/min — ABNORMAL LOW (ref >60)
GLUCOSE: 107 mg/dL — AB (ref 65–99)
POTASSIUM: 5.1 mmol/L (ref 3.5–5.1)
Sodium: 146 mmol/L — ABNORMAL HIGH (ref 135–145)

## 2017-07-17 LAB — POCT I-STAT 3, ART BLOOD GAS (G3+)
ACID-BASE DEFICIT: 9 mmol/L — AB (ref 0.0–2.0)
Bicarbonate: 16.6 mmol/L — ABNORMAL LOW (ref 20.0–28.0)
O2 SAT: 100 %
PO2 ART: 217 mmHg — AB (ref 83.0–108.0)
Patient temperature: 98.4
TCO2: 18 mmol/L — AB (ref 22–32)
pCO2 arterial: 32.6 mmHg (ref 32.0–48.0)
pH, Arterial: 7.316 — ABNORMAL LOW (ref 7.350–7.450)

## 2017-07-17 LAB — GLUCOSE, CAPILLARY
GLUCOSE-CAPILLARY: 131 mg/dL — AB (ref 65–99)
GLUCOSE-CAPILLARY: 153 mg/dL — AB (ref 65–99)
Glucose-Capillary: 109 mg/dL — ABNORMAL HIGH (ref 65–99)
Glucose-Capillary: 128 mg/dL — ABNORMAL HIGH (ref 65–99)
Glucose-Capillary: 176 mg/dL — ABNORMAL HIGH (ref 65–99)
Glucose-Capillary: 97 mg/dL (ref 65–99)

## 2017-07-17 LAB — CBC
HCT: 28.8 % — ABNORMAL LOW (ref 36.0–46.0)
Hemoglobin: 9.2 g/dL — ABNORMAL LOW (ref 12.0–15.0)
MCH: 27.9 pg (ref 26.0–34.0)
MCHC: 31.9 g/dL (ref 30.0–36.0)
MCV: 87.3 fL (ref 78.0–100.0)
PLATELETS: 228 10*3/uL (ref 150–400)
RBC: 3.3 MIL/uL — ABNORMAL LOW (ref 3.87–5.11)
RDW: 18.3 % — ABNORMAL HIGH (ref 11.5–15.5)
WBC: 17.5 10*3/uL — ABNORMAL HIGH (ref 4.0–10.5)

## 2017-07-17 LAB — BLOOD CULTURE ID PANEL (REFLEXED)
Acinetobacter baumannii: NOT DETECTED
CANDIDA ALBICANS: NOT DETECTED
CANDIDA GLABRATA: NOT DETECTED
CANDIDA KRUSEI: NOT DETECTED
Candida parapsilosis: NOT DETECTED
Candida tropicalis: NOT DETECTED
ENTEROBACTER CLOACAE COMPLEX: NOT DETECTED
ENTEROBACTERIACEAE SPECIES: NOT DETECTED
ESCHERICHIA COLI: NOT DETECTED
Enterococcus species: NOT DETECTED
Haemophilus influenzae: NOT DETECTED
KLEBSIELLA OXYTOCA: NOT DETECTED
KLEBSIELLA PNEUMONIAE: NOT DETECTED
Listeria monocytogenes: NOT DETECTED
Methicillin resistance: DETECTED — AB
NEISSERIA MENINGITIDIS: NOT DETECTED
Proteus species: NOT DETECTED
Pseudomonas aeruginosa: NOT DETECTED
STREPTOCOCCUS AGALACTIAE: NOT DETECTED
STREPTOCOCCUS PYOGENES: NOT DETECTED
STREPTOCOCCUS SPECIES: NOT DETECTED
Serratia marcescens: NOT DETECTED
Staphylococcus aureus (BCID): NOT DETECTED
Staphylococcus species: DETECTED — AB
Streptococcus pneumoniae: NOT DETECTED

## 2017-07-17 LAB — MAGNESIUM: Magnesium: 1.9 mg/dL (ref 1.7–2.4)

## 2017-07-17 LAB — URINE CULTURE

## 2017-07-17 LAB — PROCALCITONIN: Procalcitonin: 0.57 ng/mL

## 2017-07-17 LAB — PHOSPHORUS: Phosphorus: 1.6 mg/dL — ABNORMAL LOW (ref 2.5–4.6)

## 2017-07-17 MED ORDER — FLUCONAZOLE IN SODIUM CHLORIDE 400-0.9 MG/200ML-% IV SOLN
400.0000 mg | Freq: Once | INTRAVENOUS | Status: AC
  Administered 2017-07-17: 400 mg via INTRAVENOUS
  Filled 2017-07-17: qty 200

## 2017-07-17 MED ORDER — SODIUM CHLORIDE 0.9 % IV BOLUS
1000.0000 mL | Freq: Once | INTRAVENOUS | Status: AC
  Administered 2017-07-17: 1000 mL via INTRAVENOUS

## 2017-07-17 MED ORDER — FLUCONAZOLE IN SODIUM CHLORIDE 200-0.9 MG/100ML-% IV SOLN
200.0000 mg | INTRAVENOUS | Status: DC
  Administered 2017-07-18 – 2017-07-19 (×2): 200 mg via INTRAVENOUS
  Filled 2017-07-17 (×3): qty 100

## 2017-07-17 MED ORDER — LINEZOLID 600 MG/300ML IV SOLN
600.0000 mg | Freq: Two times a day (BID) | INTRAVENOUS | Status: DC
  Administered 2017-07-17: 600 mg via INTRAVENOUS
  Filled 2017-07-17 (×4): qty 300

## 2017-07-17 MED ORDER — IPRATROPIUM-ALBUTEROL 0.5-2.5 (3) MG/3ML IN SOLN: 3.0000 mL | RESPIRATORY_TRACT | Status: DC | PRN

## 2017-07-17 MED ORDER — SODIUM GLYCEROPHOSPHATE 1 MMOLE/ML IV SOLN
20.0000 mmol | Freq: Once | INTRAVENOUS | Status: AC
  Administered 2017-07-17: 20 mmol via INTRAVENOUS
  Filled 2017-07-17: qty 20

## 2017-07-17 NOTE — Progress Notes (Signed)
PULMONARY / CRITICAL CARE MEDICINE   Name: Debra [name] MRN: [id number] DOB: 11/07/1965    ADMISSION DATE:  07/15/2017 CONSULTATION DATE:  07/16/2017  REFERRING MD:  Dr. Blinda Leatherwood  CHIEF COMPLAINT:  AMS/Sepsis  HISTORY OF PRESENT ILLNESS:   52 year old female with PMH of MS, Neurogenic bladder, DM, Frequent UTI, Sacral ulcer with known osteomyelitis whom resides in SNF. Recent admission 3/9-3/29 for VRE +Proteus Bacteremia, abscess in gluteal muscles with osteo and hyperglycemia Recent admission 4/30-5/9 with Methicillin Resistant CoNS Bacteremia suspected due to PICC, with sacral wound infection.  Presented to ED 5/19 from SNF with AMS. Family reports that Friday patient seemed more lethargic. Today patient was found unresponsive. When EMS arrived BP 75/50 with temp 94.2. Upon arrival to ED patient intubated. Urine noted to be thick with sediment. Given 1.75L of NS. PCCM asked to admit.   SUBJECTIVE: BP under good control today. Mental status slowly improving. Minimal pressor demands.   VITAL SIGNS: BP (!) 128/59   Pulse 84   Temp 99 F (37.2 C)   Resp 18   Ht 5\' 6"  (1.676 m)   Wt 60.7 kg (133 lb 13.1 oz)   SpO2 100%   BMI 21.60 kg/m   HEMODYNAMICS: CVP:  [3 mmHg-6 mmHg] 6 mmHg  VENTILATOR SETTINGS: Vent Mode: PRVC FiO2 (%):  [40 %] 40 % Set Rate:  [18 bmp] 18 bmp Vt Set:  [480 mL] 480 mL PEEP:  [5 cmH20] 5 cmH20 Plateau Pressure:  [15 cmH20-18 cmH20] 16 cmH20  INTAKE / OUTPUT: I/O last 3 completed shifts: In: 7376.6 [I.V.:397.5; Other:120; NG/GT:708.3; IV Piggyback:6150.8] Out: 1135 [Urine:1135]  PHYSICAL EXAMINATION: General:  Frail female appears older than stated age.  Neuro:  Opens eyes to voice and follows simple commands.  HEENT:  ETT in place. PERRL, no MRG Cardiovascular:  RRR, no MRG  Lungs: Clear bilateral vent assisted breath sounds.  Abdomen:  Non-distended, active bowel sounds  Musculoskeletal:  -edema  Skin:  Noted healing wounds to bilateral  heels   LABS:  BMET Recent Labs  Lab 07/16/17 0021 07/16/17 0941  NA 143 143  K 2.9* 3.0*  CL 116* 117*  CO2 21* 17*  BUN 17 15  CREATININE 1.75* 1.52*  GLUCOSE 92 118*    Electrolytes Recent Labs  Lab 07/16/17 0021 07/16/17 0055 07/16/17 0941  CALCIUM 8.5*  --  7.6*  MG  --  1.8 2.0  PHOS  --  2.9  --     CBC Recent Labs  Lab 07/16/17 0021  WBC 11.8*  HGB 10.7*  HCT 33.5*  PLT 263    Coag's No results for input(s): APTT, INR in the last 168 hours.  Sepsis Markers Recent Labs  Lab 07/16/17 0023 07/16/17 0055 07/16/17 0237 07/17/17 0440  LATICACIDVEN 1.94*  --  1.6  --   PROCALCITON  --  0.17  --  0.57    ABG Recent Labs  Lab 07/16/17 0115 07/16/17 0149  PHART 7.306* 7.361  PCO2ART 44.0 39.8  PO2ART 23.0* 601.0*    Liver Enzymes Recent Labs  Lab 07/16/17 0021  AST 43*  ALT 12*  ALKPHOS 224*  BILITOT 0.4  ALBUMIN 1.5*    Cardiac Enzymes Recent Labs  Lab 07/16/17 0055 07/16/17 0621 07/16/17 0941  TROPONINI 0.03* 0.21* 0.21*    Glucose Recent Labs  Lab 07/16/17 1141 07/16/17 1525 07/16/17 1927 07/16/17 2316 07/17/17 0344 07/17/17 0746  GLUCAP 84 135* 213* 262* 176* 131*    Imaging No results  found.   STUDIES:  CXR 5/20 > Endotracheal tube tip is about 2.2 cm superior to the carina. Right central venous catheter tip overlies the SVC. Right upper extremity catheter tip seen to the cavoatrial region. Esophageal tube tip is in the left upper quadrant no acute opacity or pleural effusion. Borderline cardiomegaly. No pneumothorax CT head 5/20 > No definite CT evidence for acute intracranial abnormality. Stable hypodensity within the bilateral white matter  CULTURES: Blood 5/20 >>> Tracheal Asp 5/20 >>> Urine 5/20 > 100K Yeast  ANTIBIOTICS: Linzolid 5/20 >  Meropenem 5/20 >  Anidulafungin 5/21 >  SIGNIFICANT EVENTS: 5/20 > Presents to ED   LINES/TUBES: ETT 5/20 >> Right IJ 5/20 >>   DISCUSSION: 52 year old  female whom resides in SNF presents in septic shock with presumed urosepsis   ASSESSMENT / PLAN:  PULMONARY A: Respiratory insufficieny secondary to encephalopathy in setting of septic shock  P:   Full vent support VAP prevention bundle CXR in AM Failed wean due to bradypnea   CARDIOVASCULAR A:  Septic Shock  H/O Diastolic Heart Failure (G1DD, EF 60-65)  P:  Cardiac Monitoring  Maintain MAP >65 mmHg Trend Troponin, trending up, continue to trend.  Norepinephrine for MAP goal (currenlty on 0.33mcg). Wean to off. DC art line  RENAL A:   Anion Gap Metabolic Acidosis with Lactic Acidosis (lactic cleared 5/20) H/O Neurogenic bladder, chronic foley  P:   BMP pending  Replace electrolytes as indicated  NS @ 75 ml/hr  Foley occluded by sediment. Bladder scan q 8 and irrigate as needed.   GASTROINTESTINAL A:   SUP H/O Dysphagia  P:   NPO PPI   HEMATOLOGIC A:   Anemia  P:  Trend CBC Heparin SQ for VTE  INFECTIOUS A:   Septic Shock > Urosepsis vs Osteomyelitis (wounds look good).  Urinary tract infection > yeast on urine culture.   H/O VRE +Proteus Bacteremia, Methicillin Resistant CoNS P:   Trend WBC and Fever Curve Trend PCT Follow Culture Data  Meropenem, Linezolid  Add empiric IV antifungal.  US abdomen  ENDOCRINE A:   H/O DM    P:   Trend glucose SSI  NEUROLOGIC A:   Acute Metabolic Encephalopathy in setting of septic shock  H/O MS, Depression  P:   RASS goal: 0/-1 Wean Fentanyl gtt to achieve RASS PRN Versed   FAMILY  - Updates: Family updated at bedside   - Inter-disciplinary family meet or Palliative Care meeting due by: 07/23/2017   Joneen Roach, AGACNP-BC [facility] Pager [phone] or [phone]  07/17/2017 11:51 AM

## 2017-07-17 NOTE — Progress Notes (Signed)
eLink Physician-Brief Progress Note Patient Name: Debra [name] DOB: [birth year] MRN: [id number]   Date of Service  07/17/2017  HPI/Events of Note  Multiple loose stools - Request for Flexiseal.  eICU Interventions  Will order Flexiseal.      Intervention Category Major Interventions: Other:  Lenell Antu 07/17/2017, 12:52 AM

## 2017-07-18 LAB — GLUCOSE, CAPILLARY
GLUCOSE-CAPILLARY: 103 mg/dL — AB (ref 65–99)
GLUCOSE-CAPILLARY: 154 mg/dL — AB (ref 65–99)
Glucose-Capillary: 145 mg/dL — ABNORMAL HIGH (ref 65–99)
Glucose-Capillary: 111 mg/dL — ABNORMAL HIGH (ref 65–99)
Glucose-Capillary: 128 mg/dL — ABNORMAL HIGH (ref 65–99)
Glucose-Capillary: 75 mg/dL (ref 65–99)

## 2017-07-18 LAB — CBC
HCT: 28.4 % — ABNORMAL LOW (ref 36.0–46.0)
Hemoglobin: 8.9 g/dL — ABNORMAL LOW (ref 12.0–15.0)
MCH: 27.5 pg (ref 26.0–34.0)
MCHC: 31.3 g/dL (ref 30.0–36.0)
MCV: 87.7 fL (ref 78.0–100.0)
Platelets: 221 10*3/uL (ref 150–400)
RBC: 3.24 MIL/uL — ABNORMAL LOW (ref 3.87–5.11)
RDW: 18.6 % — AB (ref 11.5–15.5)
WBC: 15.5 10*3/uL — AB (ref 4.0–10.5)

## 2017-07-18 LAB — BASIC METABOLIC PANEL
ANION GAP: 5 (ref 5–15)
BUN: 18 mg/dL (ref 6–20)
CO2: 19 mmol/L — ABNORMAL LOW (ref 22–32)
Calcium: 8 mg/dL — ABNORMAL LOW (ref 8.9–10.3)
Chloride: 123 mmol/L — ABNORMAL HIGH (ref 101–111)
Creatinine, Ser: 1.56 mg/dL — ABNORMAL HIGH (ref 0.44–1.00)
GFR calc Af Amer: 44 mL/min — ABNORMAL LOW (ref >60)
GFR, EST NON AFRICAN AMERICAN: 38 mL/min — AB (ref >60)
GLUCOSE: 133 mg/dL — AB (ref 65–99)
Potassium: 4.6 mmol/L (ref 3.5–5.1)
SODIUM: 147 mmol/L — AB (ref 135–145)

## 2017-07-18 LAB — PROCALCITONIN: PROCALCITONIN: 0.42 ng/mL

## 2017-07-18 LAB — CULTURE, BLOOD (ROUTINE X 2)

## 2017-07-18 LAB — MAGNESIUM: MAGNESIUM: 1.9 mg/dL (ref 1.7–2.4)

## 2017-07-18 MED ORDER — ORAL CARE MOUTH RINSE
15.0000 mL | Freq: Two times a day (BID) | OROMUCOSAL | Status: DC
  Administered 2017-07-19 – 2017-07-24 (×8): 15 mL via OROMUCOSAL

## 2017-07-18 MED ORDER — HYDRALAZINE HCL 20 MG/ML IJ SOLN
10.0000 mg | Freq: Once | INTRAMUSCULAR | Status: AC
  Administered 2017-07-18: 10 mg via INTRAVENOUS
  Filled 2017-07-18: qty 1

## 2017-07-18 NOTE — Progress Notes (Addendum)
PULMONARY / CRITICAL CARE MEDICINE   Name: Debra [name] MRN: [id number] DOB: 21-Sep-1965    ADMISSION DATE:  07/15/2017 CONSULTATION DATE:  07/16/2017  REFERRING MD:  Dr. Blinda Leatherwood  CHIEF COMPLAINT:  AMS/Sepsis  HISTORY OF PRESENT ILLNESS:   52 year old female with PMH of MS, Neurogenic bladder, DM, Frequent UTI, Sacral ulcer with known osteomyelitis whom resides in SNF. Recent admission 3/9-3/29 for VRE +Proteus Bacteremia, abscess in gluteal muscles with osteo and hyperglycemia Recent admission 4/30-5/9 with Methicillin Resistant CoNS Bacteremia suspected due to PICC, with sacral wound infection.  Presented to ED 5/19 from SNF with AMS. Family reports that Friday patient seemed more lethargic. Today patient was found unresponsive. When EMS arrived BP 75/50 with temp 94.2. Upon arrival to ED patient intubated. Urine noted to be thick with sediment. Given 1.75L of NS. PCCM asked to admit.   SUBJECTIVE:  No acute events.  Off pressors.  Tolerating PS wean at 12/5 this AM.  VITAL SIGNS: BP (!) 164/84   Pulse 79   Temp 97.9 F (36.6 C) (Oral)   Resp 11   Ht 5\' 6"  (1.676 m)   Wt 61.6 kg (135 lb 12.9 oz)   SpO2 100%   BMI 21.92 kg/m   HEMODYNAMICS: CVP:  [3 mmHg-7 mmHg] 5 mmHg  VENTILATOR SETTINGS: Vent Mode: PSV;CPAP FiO2 (%):  [30 %-40 %] 30 % Set Rate:  [18 bmp] 18 bmp Vt Set:  [460 mL-480 mL] 460 mL PEEP:  [5 cmH20] 5 cmH20 Pressure Support:  [12 cmH20] 12 cmH20 Plateau Pressure:  [13 cmH20-18 cmH20] 15 cmH20  INTAKE / OUTPUT: I/O last 3 completed shifts: In: 5261.9 [I.V.:111.9; Other:500; NG/GT:1180; IV Piggyback:3470] Out: 1415 [Urine:1415]  PHYSICAL EXAMINATION: General:  Frail female appears older than stated age, in NAD. Neuro:  Somnolent but opens eyes to noxious stimuli. HEENT:  ETT in place. PERRL, no MRG. Cardiovascular:  RRR, no MRG.  Lungs: CTAB. Abdomen:  Non-distended, active bowel sounds.  Musculoskeletal:  -edema.  Skin:  Noted healing wounds to  bilateral heels.   LABS:  BMET Recent Labs  Lab 07/16/17 0941 07/17/17 1142 07/18/17 0427  NA 143 146* 147*  K 3.0* 5.1 4.6  CL 117* 124* 123*  CO2 17* 18* 19*  BUN 15 17 18   CREATININE 1.52* 1.69* 1.56*  GLUCOSE 118* 107* 133*    Electrolytes Recent Labs  Lab 07/16/17 0055 07/16/17 0941 07/17/17 1142 07/18/17 0427  CALCIUM  --  7.6* 7.7* 8.0*  MG 1.8 2.0 1.9 1.9  PHOS 2.9  --  1.6*  --     CBC Recent Labs  Lab 07/16/17 0021 07/17/17 1142 07/18/17 0427  WBC 11.8* 17.5* 15.5*  HGB 10.7* 9.2* 8.9*  HCT 33.5* 28.8* 28.4*  PLT 263 228 221    Coag's No results for input(s): APTT, INR in the last 168 hours.  Sepsis Markers Recent Labs  Lab 07/16/17 0023 07/16/17 0055 07/16/17 0237 07/17/17 0440 07/18/17 0427  LATICACIDVEN 1.94*  --  1.6  --   --   PROCALCITON  --  0.17  --  0.57 0.42    ABG Recent Labs  Lab 07/16/17 0115 07/16/17 0149 07/17/17 1211  PHART 7.306* 7.361 7.316*  PCO2ART 44.0 39.8 32.6  PO2ART 23.0* 601.0* 217.0*    Liver Enzymes Recent Labs  Lab 07/16/17 0021  AST 43*  ALT 12*  ALKPHOS 224*  BILITOT 0.4  ALBUMIN 1.5*    Cardiac Enzymes Recent Labs  Lab 07/16/17 0621 07/16/17 0941 07/17/17  1142  TROPONINI 0.21* 0.21* 0.07*    Glucose Recent Labs  Lab 07/17/17 1140 07/17/17 1601 07/17/17 1939 07/17/17 2340 07/18/17 0339 07/18/17 0724  GLUCAP 97 109* 153* 128* 103* 154*    Imaging US Abdomen Complete  Result Date: 07/17/2017 CLINICAL DATA:  52 year old female with a history of abdominal pain EXAM: ABDOMEN ULTRASOUND COMPLETE COMPARISON:  CT 06/26/2017 FINDINGS: Gallbladder: History of cholecystectomy Common bile duct: Diameter: 3 mm Liver: Heterogeneous appearance of liver parenchyma. Portal vein patent, with hepatopetal flow. Hyperechoic focus of the right liver measures 2.0 cm x 1.6 cm x 1.7 cm IVC: No abnormality visualized. Pancreas: Visualized portion unremarkable. Spleen: Size and appearance within  normal limits. Right Kidney: Length: 10.9 cm. Echogenicity within normal limits. No mass or hydronephrosis visualized. Left Kidney: Length: 10.9 cm. Echogenicity within normal limits. No mass or hydronephrosis visualized. Abdominal aorta: No aneurysm visualized. Other findings: None. IMPRESSION: No acute finding to account for the patient's abdominal pain. Absent gallbladder with history of cholecystectomy. There is a 2 cm hyperechoic lesion within the right liver. The most common entity would be a hepatic hemangioma or focal fat, however, ultrasound characteristics are nonspecific. Further evaluation with liver MRI may be considered, particularly if the patient has a known history of malignancy. Electronically Signed   By: Gilmer Mor D.O.   On: 07/17/2017 16:28     STUDIES:  CXR 5/20 > Endotracheal tube tip is about 2.2 cm superior to the carina. Right central venous catheter tip overlies the SVC. Right upper extremity catheter tip seen to the cavoatrial region. Esophageal tube tip is in the left upper quadrant no acute opacity or pleural effusion. Borderline cardiomegaly. No pneumothorax CT head 5/20 > No definite CT evidence for acute intracranial abnormality. Stable hypodensity within the bilateral white matter Abd [place] 5/21 > No acute events.  CULTURES: Blood 5/20 >>> Tracheal Asp 5/20 >>> Urine 5/20 > 100K Yeast  ANTIBIOTICS: Linezolid 5/20 > 5/22 Meropenem 5/20 >  Anidulafungin 5/21 > 5/21 Diflucan 5/21 >   SIGNIFICANT EVENTS: 5/20 > Presents to ED   LINES/TUBES: ETT 5/20 >> Right IJ 5/20 >>   DISCUSSION: 52 year old female whom resides in SNF presents in septic shock with presumed urosepsis   ASSESSMENT / PLAN:  PULMONARY A: Respiratory insufficieny secondary to encephalopathy in setting of septic shock P:   Continue PS wean as able (has had apnea past 2 days) - will see how tolerates today 5/22 VAP prevention bundle Follow CXR  CARDIOVASCULAR A:  Septic Shock - now  off pressors H/O Diastolic Heart Failure (G1DD, EF 60-65)  P:  Monitor hemodynamics  RENAL A:   Anion Gap Metabolic Acidosis H/O Neurogenic bladder, chronic foley  P:   Continue fluids Replace electrolytes as indicated  Follow BMP Foley occluded by sediment. Bladder scan q 8 and irrigate as needed.   GASTROINTESTINAL A:   SUP H/O Dysphagia  2cm hyperechoic lesion within right liver - ? Hepatic hemangioma P:   NPO PPI  Consider liver MRI (though no hx malignancy)  HEMATOLOGIC A:   Anemia  P:  Transfuse for Hgb < 7 Trend CBC Heparin SQ for VTE  INFECTIOUS A:   Septic Shock > Presumed Urosepsis.  Possible Osteomyelitis (though wounds look good).  Urinary tract infection > yeast on urine culture.   H/O VRE +Proteus Bacteremia, Methicillin Resistant CoNS P:   Continue abx / antifungals (merrem, diflucan), will d/c linezolid Trend PCT Follow Culture Data   ENDOCRINE A:   H/O  DM    P:   Trend glucose SSI  NEUROLOGIC A:   Acute Metabolic Encephalopathy in setting of septic shock  H/O MS, Depression  P:   RASS goal: 0 PRN fentanyl, PRN Versed   FAMILY  - Updates: Family updated at bedside   No family available 5/22.  - Inter-disciplinary family meet or Palliative Care meeting due by: 07/23/2017    Rutherford Guys, PA - C [facility] Pager: [phone]  or [phone] 07/18/2017, 8:06 AM

## 2017-07-18 NOTE — Progress Notes (Signed)
While irrigating foley catheter per order, pressure form irrigation caused the red seal on the connection between the catheter and drainage bag to crack open. The catheter did not separate from the drain line at any time. Called MD Arsenio Loader concerning orders to replace foley. Awaiting orders at this time. Will continue to monitor.

## 2017-07-18 NOTE — Procedures (Signed)
Extubation Procedure Note  Patient Details:   Name: Debra [name] DOB: [birth year] MRN: [id number]   Airway Documentation:    Vent end date: 07/18/17 Vent end time: 1753   Evaluation  O2 sats: stable throughout Complications: No apparent complications Patient did tolerate procedure well. Bilateral Breath Sounds: Diminished   Yes   Patient extubated to 2lpm [place] no strider heard RN in room  [employer] 07/18/2017, 5:56 PM

## 2017-07-18 NOTE — Progress Notes (Signed)
eLink Physician-Brief Progress Note Patient Name: Debra [name] DOB: 03/09/1965 MRN: [id number]   Date of Service  07/18/2017  HPI/Events of Note  Seal on Foley catheter broken while flushing. Request to replace Foley catheter.   eICU Interventions  Will order: 1. Replace Foley catheter.      Intervention Category Major Interventions: Other:  Emori Kamau Dennard Nip 07/18/2017, 2:20 AM

## 2017-07-19 ENCOUNTER — Inpatient Hospital Stay (HOSPITAL_COMMUNITY): Payer: Medicaid Other

## 2017-07-19 LAB — GLUCOSE, CAPILLARY
GLUCOSE-CAPILLARY: 138 mg/dL — AB (ref 65–99)
GLUCOSE-CAPILLARY: 77 mg/dL (ref 65–99)
Glucose-Capillary: 101 mg/dL — ABNORMAL HIGH (ref 65–99)
Glucose-Capillary: 119 mg/dL — ABNORMAL HIGH (ref 65–99)
Glucose-Capillary: 148 mg/dL — ABNORMAL HIGH (ref 65–99)
Glucose-Capillary: 148 mg/dL — ABNORMAL HIGH (ref 65–99)

## 2017-07-19 LAB — CBC
HEMATOCRIT: 28 % — AB (ref 36.0–46.0)
Hemoglobin: 8.7 g/dL — ABNORMAL LOW (ref 12.0–15.0)
MCH: 27.5 pg (ref 26.0–34.0)
MCHC: 31.1 g/dL (ref 30.0–36.0)
MCV: 88.6 fL (ref 78.0–100.0)
Platelets: 215 10*3/uL (ref 150–400)
RBC: 3.16 MIL/uL — ABNORMAL LOW (ref 3.87–5.11)
RDW: 18.4 % — AB (ref 11.5–15.5)
WBC: 12.8 10*3/uL — AB (ref 4.0–10.5)

## 2017-07-19 LAB — BASIC METABOLIC PANEL
Anion gap: 8 (ref 5–15)
BUN: 16 mg/dL (ref 6–20)
CHLORIDE: 123 mmol/L — AB (ref 101–111)
CO2: 18 mmol/L — ABNORMAL LOW (ref 22–32)
Calcium: 8.4 mg/dL — ABNORMAL LOW (ref 8.9–10.3)
Creatinine, Ser: 1.32 mg/dL — ABNORMAL HIGH (ref 0.44–1.00)
GFR calc Af Amer: 53 mL/min — ABNORMAL LOW (ref >60)
GFR calc non Af Amer: 46 mL/min — ABNORMAL LOW (ref >60)
Glucose, Bld: 126 mg/dL — ABNORMAL HIGH (ref 65–99)
Potassium: 4.1 mmol/L (ref 3.5–5.1)
SODIUM: 149 mmol/L — AB (ref 135–145)

## 2017-07-19 LAB — MAGNESIUM: Magnesium: 1.9 mg/dL (ref 1.7–2.4)

## 2017-07-19 LAB — PHOSPHORUS: Phosphorus: 2.8 mg/dL (ref 2.5–4.6)

## 2017-07-19 MED ORDER — OXYCODONE-ACETAMINOPHEN 5-325 MG PO TABS
1.0000 | ORAL_TABLET | Freq: Four times a day (QID) | ORAL | Status: DC | PRN
  Administered 2017-07-19 – 2017-07-24 (×6): 1 via ORAL
  Filled 2017-07-19 (×7): qty 1

## 2017-07-19 MED ORDER — MAGNESIUM CHLORIDE 64 MG PO TBEC
1.0000 | DELAYED_RELEASE_TABLET | Freq: Every day | ORAL | Status: AC
  Administered 2017-07-19 – 2017-07-23 (×4): 64 mg via ORAL
  Filled 2017-07-19 (×6): qty 1

## 2017-07-19 MED ORDER — PRO-STAT SUGAR FREE PO LIQD
30.0000 mL | Freq: Three times a day (TID) | ORAL | Status: DC
  Administered 2017-07-19 – 2017-07-24 (×5): 30 mL via ORAL
  Filled 2017-07-19 (×7): qty 30

## 2017-07-19 MED ORDER — ADULT MULTIVITAMIN W/MINERALS CH
1.0000 | ORAL_TABLET | Freq: Every day | ORAL | Status: DC
  Administered 2017-07-20 – 2017-07-24 (×5): 1 via ORAL
  Filled 2017-07-19 (×7): qty 1

## 2017-07-19 MED ORDER — AMLODIPINE BESYLATE 10 MG PO TABS
10.0000 mg | ORAL_TABLET | Freq: Every day | ORAL | Status: DC
Start: 2017-07-19 — End: 2017-07-24
  Administered 2017-07-19 – 2017-07-24 (×6): 10 mg via ORAL
  Filled 2017-07-19 (×8): qty 1

## 2017-07-19 MED ORDER — LABETALOL HCL 5 MG/ML IV SOLN
10.0000 mg | INTRAVENOUS | Status: DC | PRN
  Administered 2017-07-19: 10 mg via INTRAVENOUS
  Filled 2017-07-19: qty 4

## 2017-07-19 MED ORDER — METOPROLOL SUCCINATE ER 100 MG PO TB24
100.0000 mg | ORAL_TABLET | Freq: Every day | ORAL | Status: DC
  Administered 2017-07-19 – 2017-07-24 (×6): 100 mg via ORAL
  Filled 2017-07-19 (×8): qty 1

## 2017-07-19 NOTE — Progress Notes (Signed)
Pt BP elevated to 175/85. Called [facility] to obtain PRN orders for BP control. Will continue to monitor.

## 2017-07-19 NOTE — Progress Notes (Signed)
CSW attempted to speak with pt at bedside to assess for further needs. Pt not wanting to talk to CSW at this time. CSW expressed to pt that CSW would try again at another time.    Debra [name], MSW, LCSW-A Emergency Department Clinical Social Worker [phone]

## 2017-07-19 NOTE — Progress Notes (Signed)
Nutrition Follow-up  DOCUMENTATION CODES:   Non-severe (moderate) malnutrition in context of chronic illness  INTERVENTION:    Pro-stat 30 ml TID with meals, each supplement provides 100 kcal and 15 gm protein.   Multivitamin daily.  NUTRITION DIAGNOSIS:   Moderate Malnutrition related to chronic illness(MS, type 1 diabetes mellitus) as evidenced by percent weight loss, mild fat depletion, moderate fat depletion, moderate muscle depletion(26% weight loss in 7 months).  Ongoing  GOAL:   Patient will meet greater than or equal to 90% of their needs  Unmet  MONITOR:   PO intake, Supplement acceptance, Skin, Labs  ASSESSMENT:   52 year old pt who presented to the ED from SNF after being found unresponsive by staff. Pt presented in septic shock. PMH significant for MS and chronically bed-bound at baseline, neurogenic bladder with chronic foley, recurrent UTI, CVA, CAD, type 1 diabetes mellitus, hypertension, hyperlipidemia, chronic sacral wound with known osteomyelitis, and GERD.  Discussed patient with RN today.  Extubated on 5/22. Advanced to soft diet today. Patient had not had her first meal at RD visit. She is lactose intolerant and does not like Ensure or any milky/creamy beverages, even though Ensure is lactose free.  Labs reviewed. Sodium 149 (H) CBG's: [phone] Medications reviewed and include Colace, Novolog, and Slow-mag.  Diet Order:   Diet Order           DIET SOFT Room service appropriate? Yes with Assist; Fluid consistency: Thin  Diet effective now          EDUCATION NEEDS:   Not appropriate for education at this time  Skin:  Skin Assessment: Skin Integrity Issues: Skin Integrity Issues:: Stage IV, Stage I, Other (Comment), Diabetic Ulcer Stage I: L foot, R heel Stage IV: sacrum Diabetic Ulcer: L leg Other: non-pressure wound to buttocks  Last BM:  5/23 (rectal tube)  Height:   Ht Readings from Last 1 Encounters:  07/16/17 5\' 6"  (1.676 m)     Weight:   Wt Readings from Last 1 Encounters:  07/19/17 136 lb 11 oz (62 kg)    Ideal Body Weight:  59.1 kg  BMI:  Body mass index is 22.06 kg/m.  Estimated Nutritional Needs:   Kcal:  1750-1950  Protein:  85-100 grams/day  Fluid:  >/= 1.7 L/day    Joaquin Courts, RD, LDN, CNSC Pager [phone] After Hours Pager [phone]

## 2017-07-19 NOTE — Progress Notes (Signed)
PULMONARY / CRITICAL CARE MEDICINE   Name: Debra [name] MRN: [id number] DOB: 1965-08-24    ADMISSION DATE:  07/15/2017 CONSULTATION DATE:  07/16/2017  REFERRING MD:  Dr. Blinda Leatherwood  CHIEF COMPLAINT:  AMS/Sepsis  HISTORY OF PRESENT ILLNESS:   52 year old female with PMH of MS, Neurogenic bladder, DM, Frequent UTI, Sacral ulcer with known osteomyelitis whom resides in SNF. Recent admission 3/9-3/29 for VRE +Proteus Bacteremia, abscess in gluteal muscles with osteo and hyperglycemia Recent admission 4/30-5/9 with Methicillin Resistant CoNS Bacteremia suspected due to PICC, with sacral wound infection.  Presented to ED 5/19 from SNF with AMS. Family reports that Friday patient seemed more lethargic. Today patient was found unresponsive. When EMS arrived BP 75/50 with temp 94.2. Upon arrival to ED patient intubated. Urine noted to be thick with sediment. Given 1.75L of NS. PCCM asked to admit.   SUBJECTIVE:  Extubated yesterday afternoon, tolerated well.  Stable this AM.  VITAL SIGNS: BP (!) 161/95   Pulse 94   Temp (!) 97.5 F (36.4 C) (Oral)   Resp 11   Ht 5\' 6"  (1.676 m)   Wt 62 kg (136 lb 11 oz)   SpO2 100%   BMI 22.06 kg/m   HEMODYNAMICS: CVP:  [0 mmHg-5 mmHg] 0 mmHg  VENTILATOR SETTINGS: Vent Mode: PSV;CPAP FiO2 (%):  [30 %] 30 % PEEP:  [5 cmH20] 5 cmH20 Pressure Support:  [12 cmH20] 12 cmH20  INTAKE / OUTPUT: I/O last 3 completed shifts: In: 2096.7 [I.V.:80; Other:420; NG/GT:596.7; IV Piggyback:1000] Out: 2202 [RKYHC:6237; Stool:300]  PHYSICAL EXAMINATION: General:  Frail female appears older than stated age and chronically ill appearing, in NAD. Neuro:  Somnolent but opens eyes to voice and answers questions appropriately. HEENT:  EOMI, MMM. Cardiovascular:  RRR, no MRG.  Lungs: CTAB. Abdomen:  Non-distended, active bowel sounds.  Musculoskeletal:  -edema.  Skin:  Noted healing wounds to bilateral heels.   LABS:  BMET Recent Labs  Lab 07/17/17 1142  07/18/17 0427 07/19/17 0439  NA 146* 147* 149*  K 5.1 4.6 4.1  CL 124* 123* 123*  CO2 18* 19* 18*  BUN 17 18 16   CREATININE 1.69* 1.56* 1.32*  GLUCOSE 107* 133* 126*    Electrolytes Recent Labs  Lab 07/16/17 0055  07/17/17 1142 07/18/17 0427 07/19/17 0439  CALCIUM  --    < > 7.7* 8.0* 8.4*  MG 1.8   < > 1.9 1.9 1.9  PHOS 2.9  --  1.6*  --  2.8   < > = values in this interval not displayed.    CBC Recent Labs  Lab 07/17/17 1142 07/18/17 0427 07/19/17 0439  WBC 17.5* 15.5* 12.8*  HGB 9.2* 8.9* 8.7*  HCT 28.8* 28.4* 28.0*  PLT 228 221 215    Coag's No results for input(s): APTT, INR in the last 168 hours.  Sepsis Markers Recent Labs  Lab 07/16/17 0023 07/16/17 0055 07/16/17 0237 07/17/17 0440 07/18/17 0427  LATICACIDVEN 1.94*  --  1.6  --   --   PROCALCITON  --  0.17  --  0.57 0.42    ABG Recent Labs  Lab 07/16/17 0115 07/16/17 0149 07/17/17 1211  PHART 7.306* 7.361 7.316*  PCO2ART 44.0 39.8 32.6  PO2ART 23.0* 601.0* 217.0*    Liver Enzymes Recent Labs  Lab 07/16/17 0021  AST 43*  ALT 12*  ALKPHOS 224*  BILITOT 0.4  ALBUMIN 1.5*    Cardiac Enzymes Recent Labs  Lab 07/16/17 0621 07/16/17 0941 07/17/17 1142  TROPONINI  0.21* 0.21* 0.07*    Glucose Recent Labs  Lab 07/18/17 0724 07/18/17 1053 07/18/17 1520 07/18/17 1933 07/18/17 2323 07/19/17 0332  GLUCAP 154* 145* 111* 128* 75 101*    Imaging No results found.   STUDIES:  CXR 5/20 > Endotracheal tube tip is about 2.2 cm superior to the carina. Right central venous catheter tip overlies the SVC. Right upper extremity catheter tip seen to the cavoatrial region. Esophageal tube tip is in the left upper quadrant no acute opacity or pleural effusion. Borderline cardiomegaly. No pneumothorax CT head 5/20 > No definite CT evidence for acute intracranial abnormality. Stable hypodensity within the bilateral white matter Abd [place] 5/21 > No acute events.  CULTURES: Blood 5/20  >>> Tracheal Asp 5/20 >>> Urine 5/20 > 100K Yeast  ANTIBIOTICS: Linezolid 5/20 > 5/22 Meropenem 5/20 >  Anidulafungin 5/21 > 5/21 Diflucan 5/21 >   SIGNIFICANT EVENTS: 5/20 > Presents to ED   LINES/TUBES: ETT 5/20 >> 5/22 Right IJ 5/20 >>   DISCUSSION: 52 year old female whom resides in SNF presents in septic shock with presumed urosepsis   ASSESSMENT / PLAN:  PULMONARY A: Respiratory insufficieny secondary to encephalopathy in setting of septic shock-  S/p extubation 5/22 P:   Bronchial hygiene  CARDIOVASCULAR A:  Septic Shock - resolved H/O Diastolic Heart Failure (G1DD, EF 60-65), HTN P:  Monitor hemodynamics Continue norvasc, toprol-xl, labetalol PRN  RENAL A:   Small Anion Gap Metabolic Acidosis H/O Neurogenic bladder, chronic foley  P:   Replace electrolytes as indicated  Follow BMP  GASTROINTESTINAL A:   Nutrition H/O Dysphagia  2cm hyperechoic lesion within right liver - ? Hepatic hemangioma P:   Advance diet Consider liver MRI (though no hx malignancy)  HEMATOLOGIC A:   Anemia  P:  Transfuse for Hgb < 7 Trend CBC  Heparin SQ for VTE  INFECTIOUS A:   Septic Shock > Presumed Urosepsis.  Possible Osteomyelitis (though wounds look good) Urinary tract infection > yeast on urine culture H/O VRE +Proteus Bacteremia, Methicillin Resistant CoNS P:   Continue abx / antifungals (merrem, diflucan), linezolid d/c'd 5/22 Trend PCT Follow Culture Data   ENDOCRINE A:   H/O DM    P:   Trend glucose SSI  NEUROLOGIC A:   Acute Metabolic Encephalopathy in setting of septic shock - resolved H/O MS, Depression  P:   Avoid sedating meds  FAMILY  - Updates: Family updated at bedside   No family available 5/22.  - Inter-disciplinary family meet or Palliative Care meeting due by: 07/23/2017    Stable for transfer out of ICU.  Will transfer to med-surg and ask IMTS to assume care starting in AM 5/24.   Rutherford Guys, [place] [facility] Pager: [phone]  or [phone] 07/19/2017, 7:46 AM

## 2017-07-20 ENCOUNTER — Other Ambulatory Visit: Payer: Self-pay

## 2017-07-20 DIAGNOSIS — Z91048 Other nonmedicinal substance allergy status: Secondary | ICD-10-CM

## 2017-07-20 DIAGNOSIS — J301 Allergic rhinitis due to pollen: Secondary | ICD-10-CM

## 2017-07-20 DIAGNOSIS — I10 Essential (primary) hypertension: Secondary | ICD-10-CM

## 2017-07-20 DIAGNOSIS — I2510 Atherosclerotic heart disease of native coronary artery without angina pectoris: Secondary | ICD-10-CM

## 2017-07-20 DIAGNOSIS — K219 Gastro-esophageal reflux disease without esophagitis: Secondary | ICD-10-CM

## 2017-07-20 DIAGNOSIS — B9562 Methicillin resistant Staphylococcus aureus infection as the cause of diseases classified elsewhere: Secondary | ICD-10-CM

## 2017-07-20 DIAGNOSIS — Z8249 Family history of ischemic heart disease and other diseases of the circulatory system: Secondary | ICD-10-CM

## 2017-07-20 DIAGNOSIS — R7881 Bacteremia: Secondary | ICD-10-CM

## 2017-07-20 DIAGNOSIS — R8279 Other abnormal findings on microbiological examination of urine: Secondary | ICD-10-CM

## 2017-07-20 DIAGNOSIS — E119 Type 2 diabetes mellitus without complications: Secondary | ICD-10-CM

## 2017-07-20 DIAGNOSIS — Z91011 Allergy to milk products: Secondary | ICD-10-CM

## 2017-07-20 DIAGNOSIS — M4628 Osteomyelitis of vertebra, sacral and sacrococcygeal region: Secondary | ICD-10-CM

## 2017-07-20 DIAGNOSIS — G35 Multiple sclerosis: Secondary | ICD-10-CM

## 2017-07-20 DIAGNOSIS — L89159 Pressure ulcer of sacral region, unspecified stage: Secondary | ICD-10-CM

## 2017-07-20 DIAGNOSIS — Z79899 Other long term (current) drug therapy: Secondary | ICD-10-CM

## 2017-07-20 DIAGNOSIS — Z880 Allergy status to penicillin: Secondary | ICD-10-CM

## 2017-07-20 DIAGNOSIS — Z8673 Personal history of transient ischemic attack (TIA), and cerebral infarction without residual deficits: Secondary | ICD-10-CM

## 2017-07-20 DIAGNOSIS — Z960 Presence of urogenital implants: Secondary | ICD-10-CM

## 2017-07-20 DIAGNOSIS — Z87440 Personal history of urinary (tract) infections: Secondary | ICD-10-CM

## 2017-07-20 DIAGNOSIS — N319 Neuromuscular dysfunction of bladder, unspecified: Secondary | ICD-10-CM

## 2017-07-20 DIAGNOSIS — A4159 Other Gram-negative sepsis: Secondary | ICD-10-CM

## 2017-07-20 DIAGNOSIS — Z7401 Bed confinement status: Secondary | ICD-10-CM

## 2017-07-20 DIAGNOSIS — F329 Major depressive disorder, single episode, unspecified: Secondary | ICD-10-CM

## 2017-07-20 DIAGNOSIS — Z794 Long term (current) use of insulin: Secondary | ICD-10-CM

## 2017-07-20 LAB — CBC
HCT: 26.8 % — ABNORMAL LOW (ref 36.0–46.0)
HEMOGLOBIN: 8.5 g/dL — AB (ref 12.0–15.0)
MCH: 27.5 pg (ref 26.0–34.0)
MCHC: 31.7 g/dL (ref 30.0–36.0)
MCV: 86.7 fL (ref 78.0–100.0)
Platelets: 173 10*3/uL (ref 150–400)
RBC: 3.09 MIL/uL — ABNORMAL LOW (ref 3.87–5.11)
RDW: 18.5 % — ABNORMAL HIGH (ref 11.5–15.5)
WBC: 9.7 10*3/uL (ref 4.0–10.5)

## 2017-07-20 LAB — BASIC METABOLIC PANEL
Anion gap: 7 (ref 5–15)
BUN: 15 mg/dL (ref 6–20)
CO2: 20 mmol/L — ABNORMAL LOW (ref 22–32)
CREATININE: 1.12 mg/dL — AB (ref 0.44–1.00)
Calcium: 8.3 mg/dL — ABNORMAL LOW (ref 8.9–10.3)
Chloride: 117 mmol/L — ABNORMAL HIGH (ref 101–111)
GFR calc non Af Amer: 56 mL/min — ABNORMAL LOW (ref >60)
Glucose, Bld: 115 mg/dL — ABNORMAL HIGH (ref 65–99)
Potassium: 3.9 mmol/L (ref 3.5–5.1)
SODIUM: 144 mmol/L (ref 135–145)

## 2017-07-20 LAB — MAGNESIUM: MAGNESIUM: 1.9 mg/dL (ref 1.7–2.4)

## 2017-07-20 LAB — GLUCOSE, CAPILLARY
GLUCOSE-CAPILLARY: 129 mg/dL — AB (ref 65–99)
Glucose-Capillary: 119 mg/dL — ABNORMAL HIGH (ref 65–99)
Glucose-Capillary: 174 mg/dL — ABNORMAL HIGH (ref 65–99)
Glucose-Capillary: 181 mg/dL — ABNORMAL HIGH (ref 65–99)
Glucose-Capillary: 142 mg/dL — ABNORMAL HIGH (ref 65–99)

## 2017-07-20 LAB — PHOSPHORUS: PHOSPHORUS: 2.5 mg/dL (ref 2.5–4.6)

## 2017-07-20 MED ORDER — GLUCERNA SHAKE PO LIQD
237.0000 mL | Freq: Two times a day (BID) | ORAL | Status: DC
Start: 2017-07-20 — End: 2017-07-24
  Administered 2017-07-20: 237 mL via ORAL

## 2017-07-20 MED ORDER — BACLOFEN 10 MG PO TABS
5.0000 mg | ORAL_TABLET | Freq: Three times a day (TID) | ORAL | Status: DC
  Administered 2017-07-20 – 2017-07-24 (×12): 5 mg via ORAL
  Filled 2017-07-20 (×13): qty 1

## 2017-07-20 MED ORDER — MODAFINIL 100 MG PO TABS
100.0000 mg | ORAL_TABLET | Freq: Every morning | ORAL | Status: DC
  Administered 2017-07-21 – 2017-07-24 (×4): 100 mg via ORAL
  Filled 2017-07-20 (×4): qty 1

## 2017-07-20 MED ORDER — DAKINS (1/2 STRENGTH) 0.25 % EX SOLN
Freq: Every day | CUTANEOUS | Status: DC
  Administered 2017-07-20: 21:00:00
  Administered 2017-07-21: 1
  Administered 2017-07-22 – 2017-07-23 (×2)
  Filled 2017-07-20: qty 473

## 2017-07-20 MED ORDER — TAMSULOSIN HCL 0.4 MG PO CAPS
0.4000 mg | ORAL_CAPSULE | Freq: Every day | ORAL | Status: DC
  Administered 2017-07-20 – 2017-07-24 (×5): 0.4 mg via ORAL
  Filled 2017-07-20 (×6): qty 1

## 2017-07-20 MED ORDER — MIRTAZAPINE 15 MG PO TABS
45.0000 mg | ORAL_TABLET | Freq: Every day | ORAL | Status: DC
  Administered 2017-07-20 – 2017-07-24 (×2): 45 mg via ORAL
  Filled 2017-07-20 (×4): qty 3

## 2017-07-20 MED ORDER — DAKINS (1/4 STRENGTH) 0.125 % EX SOLN
Freq: Every day | CUTANEOUS | Status: DC
  Filled 2017-07-20: qty 473

## 2017-07-20 MED ORDER — BETHANECHOL CHLORIDE 25 MG PO TABS
50.0000 mg | ORAL_TABLET | Freq: Three times a day (TID) | ORAL | Status: DC
Start: 2017-07-20 — End: 2017-07-24
  Administered 2017-07-20 – 2017-07-24 (×12): 50 mg via ORAL
  Filled 2017-07-20 (×14): qty 2

## 2017-07-20 MED ORDER — LISINOPRIL 20 MG PO TABS
20.0000 mg | ORAL_TABLET | Freq: Every day | ORAL | Status: DC
  Administered 2017-07-20 – 2017-07-24 (×5): 20 mg via ORAL
  Filled 2017-07-20 (×5): qty 1

## 2017-07-20 MED ORDER — DULOXETINE HCL 30 MG PO CPEP
30.0000 mg | ORAL_CAPSULE | Freq: Every day | ORAL | Status: DC
  Administered 2017-07-20 – 2017-07-24 (×5): 30 mg via ORAL
  Filled 2017-07-20 (×5): qty 1

## 2017-07-20 MED ORDER — HYDROMORPHONE HCL 1 MG/ML IJ SOLN: 0.5000 mg | INTRAMUSCULAR | Status: DC | PRN

## 2017-07-20 MED ORDER — INSULIN ASPART 100 UNIT/ML ~~LOC~~ SOLN
0.0000 [IU] | Freq: Three times a day (TID) | SUBCUTANEOUS | Status: DC
  Administered 2017-07-20: 2 [IU] via SUBCUTANEOUS
  Administered 2017-07-21: 8 [IU] via SUBCUTANEOUS
  Administered 2017-07-21: 2 [IU] via SUBCUTANEOUS
  Administered 2017-07-21: 8 [IU] via SUBCUTANEOUS
  Administered 2017-07-22 – 2017-07-23 (×3): 3 [IU] via SUBCUTANEOUS
  Administered 2017-07-23: 2 [IU] via SUBCUTANEOUS
  Administered 2017-07-23 – 2017-07-24 (×2): 5 [IU] via SUBCUTANEOUS
  Administered 2017-07-24: 2 [IU] via SUBCUTANEOUS
  Administered 2017-07-24: 5 [IU] via SUBCUTANEOUS

## 2017-07-20 MED ORDER — ENOXAPARIN SODIUM 40 MG/0.4ML ~~LOC~~ SOLN
40.0000 mg | SUBCUTANEOUS | Status: DC
  Administered 2017-07-20 – 2017-07-24 (×5): 40 mg via SUBCUTANEOUS
  Filled 2017-07-20 (×5): qty 0.4

## 2017-07-20 MED ORDER — ATORVASTATIN CALCIUM 40 MG PO TABS
40.0000 mg | ORAL_TABLET | Freq: Every evening | ORAL | Status: DC
  Administered 2017-07-20 – 2017-07-24 (×5): 40 mg via ORAL
  Filled 2017-07-20 (×6): qty 1

## 2017-07-20 MED ORDER — FLUCONAZOLE 100 MG PO TABS
200.0000 mg | ORAL_TABLET | Freq: Every day | ORAL | Status: AC
  Administered 2017-07-20 – 2017-07-23 (×4): 200 mg via ORAL
  Filled 2017-07-20 (×6): qty 2

## 2017-07-20 NOTE — Consult Note (Signed)
[facility] Nurse wound consult note Recently in patient.  Discharged 07/18/17.  Positive for MS and chronic osteomyelitis of coccyx.   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  Friable tissue to periwound.  Bleeds with inspection and assessment.  Left ischium with unstageable pressure injury; .8X.8cm, 100% yellow slough, no odor or drainage Pressure Injury POA: Yes Bilat heels with intact skin; Prevalon boots in place.    Dressing procedure/placement/frequency:  was previously using Santyl for enzymatic debridement of nonviable tissue to left ischium and sacrum wounds. Will begin Dakins solution for debridement since odor has developed since lst assessment.   Will implement a mattress with low airloss feature to reduce pressure. No family present to discuss plan of care. Patient is alert with flat affect today.  Will not follow at this time.  Please re-consult if needed.  Domenic Moras RN BSN [place] Pager [phone]

## 2017-07-20 NOTE — Progress Notes (Signed)
Pharmacy Antibiotic Note  Debra [name] is a 52 y.o. female admitted on 07/15/2017 with sepsis.   Meropenem continues with stop date in place for 5/26  Plan: Meropenem 1gm IV q8 hours Pharmacy to sign off and follow peripherally for changes in renal function  Height: 5\' 6"  (167.6 cm) Weight: 140 lb 3.4 oz (63.6 kg) IBW/kg (Calculated) : 59.3  Temp (24hrs), Avg:97.9 F (36.6 C), Min:97.6 F (36.4 C), Max:98.1 F (36.7 C)  Recent Labs  Lab 07/16/17 0021 07/16/17 0023 07/16/17 0237 07/16/17 0941 07/17/17 1142 07/18/17 0427 07/19/17 0439 07/20/17 0358  WBC 11.8*  --   --   --  17.5* 15.5* 12.8* 9.7  CREATININE 1.75*  --   --  1.52* 1.69* 1.56* 1.32* 1.12*  LATICACIDVEN  --  1.94* 1.6  --   --   --   --   --     Estimated Creatinine Clearance: 56.3 mL/min (A) (by C-G formula based on SCr of 1.12 mg/dL (H)).    Allergies  Allergen Reactions  . Penicillins Anaphylaxis, Nausea And Vomiting and Rash    Has patient had a PCN reaction causing immediate rash, facial/tongue/throat swelling, SOB or lightheadedness with hypotension: Yes Has patient had a PCN reaction causing severe rash involving mucus membranes or skin necrosis: Yes Has patient had a PCN reaction that required hospitalization No Has patient had a PCN reaction occurring within the last 10 years: Yes If all of the above answers are "NO", then may proceed with Cephalosporin use.   . Lactose Intolerance (Gi) Other (See Comments)  . Pollen Extract Other (See Comments)    Seasonal Allergies  . Tape Rash     Thank you  Okey Regal, PharmD [phone] 07/20/2017 8:44 AM

## 2017-07-20 NOTE — Progress Notes (Signed)
CSW following for discharge planning. CSW noting that patient is a recent readmit, last full assessment completed on 06/29/17 (see below). Patient is in from [facility], where she is a long term care resident. On previous admission, another long term care facility was sought out but the patient received no other bed offers.  CSW to continue to follow for patient's return to long term care when medically stable.  Blenda Nicely, [place] Clinical Social Worker [phone]

## 2017-07-20 NOTE — Consult Note (Signed)
[facility] for Infectious Disease  Total days of antibiotics 6        Day 6 meropenem        Day 4 fluconazole               Reason for Consult: sacral osteo    Referring Physician: vincent  Active Problems:   Sepsis ([place])   Malnutrition of moderate degree    HPI: Debra [name] is a 52 y.o. female with severe MS c/b neurogenic bladder with chronic foley, recurrent utis including ESB and VRE. She is bedbound with sacral ulcer. Hx of CVA, CAD, HTN, GERD. She was admitted on 5/20 from her facility with unresponsiveness, requiring intubation and vasopressors. Source thought to be urinary since purulent drainage from foley . She was started on linezolid and meropenem but then changed to fluconazole due to candidal uti. She was kept on meropenem to cover sacral decub ulcer infection/ischila osteo.  She is now extubated steading recovering. Leukocytosis improved after day 6 of iv abtx. She previously was on daptomycin and meropenem for her wounds (has MDRO but also has anaphylaxis with PCN) in the prior admission earlier in the month. She was seen by wound care who measured her chronic sacral woudn as 7 x 6.7 x 0.8 cm with 50% slough and exposed bone with recommendation for santyl and dakins solution for debridement. Patient reports that in the last few days she has limited use of hands that she attributes to her MS. Not having as much fever.  Past Medical History:  Diagnosis Date  . Adenomatous colonic polyps   . Anemia    2005  . Anxiety    1990  . Arthritis    "knees, hands" (05/07/2017)  . Asthma    2000  . Benign essential HTN   . Cataract   . Chronic lower back pain   . Decubital ulcer   . Depression with psychotic symptoms   . Diabetic ketoacidosis without coma associated with type 1 diabetes mellitus ([place])   . Difficult intubation    narrow airway  . Fibromyalgia 11/07/2016  . Gastroesophageal Reflux Disease (GERD)   . Heart murmur    Birth  . Hyperlipidemia    2005  . Hypertension    1998  . Internal hemorrhoids 04/24/06   on colonoscopy  . Migraine    "used to have them all the time; now maybe q6 months" (05/07/2017)  . Multiple sclerosis ([place])   . Neuropathic bladder   . Neuropathy of the hands & feet   . Restless Leg Syndrome   . Sepsis due to urinary tract infection ([place]) 04/12/2017  . Sleep paralysis   . Stable angina pectoris    2007: cath showing normal cors.   . Stroke 1990   denies residual on 05/07/2017  . Type I Diabetes Mellitus 1988    Allergies:  Allergies  Allergen Reactions  . Penicillins Anaphylaxis, Nausea And Vomiting and Rash    Has patient had a PCN reaction causing immediate rash, facial/tongue/throat swelling, SOB or lightheadedness with hypotension: Yes Has patient had a PCN reaction causing severe rash involving mucus membranes or skin necrosis: Yes Has patient had a PCN reaction that required hospitalization No Has patient had a PCN reaction occurring within the last 10 years: Yes If all of the above answers are "NO", then may proceed with Cephalosporin use.   . Lactose Intolerance (Gi) Other (See Comments)  . Pollen Extract Other (See Comments)  Seasonal Allergies  . Tape Rash     MEDICATIONS: . amLODipine  10 mg Oral Daily  . atorvastatin  40 mg Oral QPM  . baclofen  5 mg Oral TID  . bethanechol  50 mg Oral TID  . Chlorhexidine Gluconate Cloth  6 each Topical Daily  . collagenase   Topical Daily  . docusate  100 mg Per Tube BID  . DULoxetine  30 mg Oral Daily  . enoxaparin (LOVENOX) injection  40 mg Subcutaneous Q24H  . feeding supplement (GLUCERNA SHAKE)  237 mL Oral BID BM  . feeding supplement (PRO-STAT SUGAR FREE 64)  30 mL Oral TID  . fluconazole  200 mg Oral Daily  . insulin aspart  0-15 Units Subcutaneous TID WC  . lisinopril  20 mg Oral Daily  . magnesium chloride  1 tablet Oral Daily  . mouth rinse  15 mL Mouth Rinse BID  . metoprolol succinate  100 mg Oral Daily  . mirtazapine  45  mg Oral QHS  . [START ON 07/21/2017] modafinil  100 mg Oral q morning - 10a  . multivitamin with minerals  1 tablet Oral Daily  . sodium chloride flush  10-40 mL Intracatheter Q12H  . sodium hypochlorite   Irrigation Q2000  . tamsulosin  0.4 mg Oral QPC supper    Social History   Tobacco Use  . Smoking status: Never Smoker  . Smokeless tobacco: Never Used  Substance Use Topics  . Alcohol use: No    Alcohol/week: 0.0 oz  . Drug use: No    Comment: drug addict    Family History  Problem Relation Age of Onset  . Hypertension Mother   . Kidney disease Mother   . Hypertension Father   . Breast cancer Maternal Grandmother   . Prostate cancer Maternal Grandfather   . Ovarian cancer Paternal Grandmother   . Prostate cancer Paternal Grandfather   . Colon cancer Maternal Uncle        Family history of malignant neoplasm of gastrointestinal tract    Review of Systems -  + arthritis, limited grip, overall weakness, with some associated pain. Abdominal pain in the lower quadrants. 12 point ros is otherwise negative  OBJECTIVE: Temp:  [97.6 F (36.4 C)-98.4 F (36.9 C)] 98.4 F (36.9 C) (05/24 1204) Pulse Rate:  [78-86] 79 (05/24 1204) Resp:  [9-18] 16 (05/24 1204) BP: (132-157)/(74-92) 132/79 (05/24 1204) SpO2:  [100 %] 100 % (05/24 1204) Weight:  [140 lb 3.4 oz (63.6 kg)] 140 lb 3.4 oz (63.6 kg) (05/24 0500) Physical Exam  Constitutional:  oriented to person,  appears chronically ill, and under-nourished. No distress.  HENT: [place]/AT, PERRLA, no scleral icterus Mouth/Throat: Oropharynx is clear and moist. No oropharyngeal exudate.  Cardiovascular: Normal rate, regular rhythm and normal heart sounds. Exam reveals no gallop and no friction rub.  No murmur heard.  Pulmonary/Chest: Effort normal and breath sounds normal. No respiratory distress.  has no wheezes.  Neck = supple, no nuchal rigidity Abdominal: Soft. Bowel sounds are decreased  exhibits mild distension.  Tenderness to  mild palpation Lymphadenopathy: no cervical adenopathy. No axillary adenopathy Neurological: alert and oriented to person,limited strength grip-bilaterally, decrease strength to LE 1/5 Skin: did not examine sacral wounds, but described above Psychiatric: flat affect   LABS: Results for orders placed or performed during the hospital encounter of 07/15/17 (from the past 48 hour(s))  Glucose, capillary     Status: Abnormal   Collection Time: 07/18/17  7:33 PM  Result  Value Ref Range   Glucose-Capillary 128 (H) 65 - 99 mg/dL   Comment 1 Capillary Specimen    Comment 2 Notify RN   Glucose, capillary     Status: None   Collection Time: 07/18/17 11:23 PM  Result Value Ref Range   Glucose-Capillary 75 65 - 99 mg/dL   Comment 1 Capillary Specimen    Comment 2 Notify RN   Glucose, capillary     Status: Abnormal   Collection Time: 07/19/17  3:32 AM  Result Value Ref Range   Glucose-Capillary 101 (H) 65 - 99 mg/dL   Comment 1 Capillary Specimen    Comment 2 Notify RN   Basic metabolic panel     Status: Abnormal   Collection Time: 07/19/17  4:39 AM  Result Value Ref Range   Sodium 149 (H) 135 - 145 mmol/L   Potassium 4.1 3.5 - 5.1 mmol/L   Chloride 123 (H) 101 - 111 mmol/L   CO2 18 (L) 22 - 32 mmol/L   Glucose, Bld 126 (H) 65 - 99 mg/dL   BUN 16 6 - 20 mg/dL   Creatinine, Ser 1.32 (H) 0.44 - 1.00 mg/dL   Calcium 8.4 (L) 8.9 - 10.3 mg/dL   GFR calc non Af Amer 46 (L) >60 mL/min   GFR calc Af Amer 53 (L) >60 mL/min    Comment: (NOTE) The eGFR has been calculated using the CKD EPI equation. This calculation has not been validated in all clinical situations. eGFR's persistently <60 mL/min signify possible Chronic Kidney Disease.    Anion gap 8 5 - 15    Comment: Performed at [facility] [address] [place], [place] [address]  CBC     Status: Abnormal   Collection Time: 07/19/17  4:39 AM  Result Value Ref Range   WBC 12.8 (H) 4.0 - 10.5 K/uL   RBC 3.16 (L) 3.87 - 5.11  MIL/uL   Hemoglobin 8.7 (L) 12.0 - 15.0 g/dL   HCT 28.0 (L) 36.0 - 46.0 %   MCV 88.6 78.0 - 100.0 fL   MCH 27.5 26.0 - 34.0 pg   MCHC 31.1 30.0 - 36.0 g/dL   RDW 18.4 (H) 11.5 - 15.5 %   Platelets 215 150 - 400 K/uL    Comment: Performed at [facility], [place] [address], [place], [place] [address]  Magnesium     Status: None   Collection Time: 07/19/17  4:39 AM  Result Value Ref Range   Magnesium 1.9 1.7 - 2.4 mg/dL    Comment: Performed at [facility] [address], [place], [place] [address]  Phosphorus     Status: None   Collection Time: 07/19/17  4:39 AM  Result Value Ref Range   Phosphorus 2.8 2.5 - 4.6 mg/dL    Comment: Performed at [facility] [address], [place], [place] [address]  Glucose, capillary     Status: Abnormal   Collection Time: 07/19/17  8:15 AM  Result Value Ref Range   Glucose-Capillary 138 (H) 65 - 99 mg/dL   Comment 1 Notify RN   Glucose, capillary     Status: Abnormal   Collection Time: 07/19/17 12:00 PM  Result Value Ref Range   Glucose-Capillary 119 (H) 65 - 99 mg/dL   Comment 1 Notify RN   Glucose, capillary     Status: Abnormal   Collection Time: 07/19/17  3:32 PM  Result Value Ref Range   Glucose-Capillary 148 (H)  65 - 99 mg/dL   Comment 1 Capillary Specimen   Glucose, capillary     Status: Abnormal   Collection Time: 07/19/17  8:03 PM  Result Value Ref Range   Glucose-Capillary 148 (H) 65 - 99 mg/dL   Comment 1 Capillary Specimen    Comment 2 Notify RN   Glucose, capillary     Status: None   Collection Time: 07/19/17 11:35 PM  Result Value Ref Range   Glucose-Capillary 77 65 - 99 mg/dL   Comment 1 Notify RN    Comment 2 Document in Chart   CBC     Status: Abnormal   Collection Time: 07/20/17  3:58 AM  Result Value Ref Range   WBC 9.7 4.0 - 10.5 K/uL   RBC 3.09 (L) 3.87 - 5.11 MIL/uL   Hemoglobin 8.5 (L) 12.0 - 15.0 g/dL   HCT 26.8 (L) 36.0 - 46.0 %   MCV 86.7 78.0 - 100.0 fL   MCH 27.5 26.0 - 34.0 pg    MCHC 31.7 30.0 - 36.0 g/dL   RDW 18.5 (H) 11.5 - 15.5 %   Platelets 173 150 - 400 K/uL    Comment: Performed at [facility], [place] [address] [place], [place] [address]  Basic metabolic panel     Status: Abnormal   Collection Time: 07/20/17  3:58 AM  Result Value Ref Range   Sodium 144 135 - 145 mmol/L   Potassium 3.9 3.5 - 5.1 mmol/L   Chloride 117 (H) 101 - 111 mmol/L   CO2 20 (L) 22 - 32 mmol/L   Glucose, Bld 115 (H) 65 - 99 mg/dL   BUN 15 6 - 20 mg/dL   Creatinine, Ser 1.12 (H) 0.44 - 1.00 mg/dL   Calcium 8.3 (L) 8.9 - 10.3 mg/dL   GFR calc non Af Amer 56 (L) >60 mL/min   GFR calc Af Amer >60 >60 mL/min    Comment: (NOTE) The eGFR has been calculated using the CKD EPI equation. This calculation has not been validated in all clinical situations. eGFR's persistently <60 mL/min signify possible Chronic Kidney Disease.    Anion gap 7 5 - 15    Comment: Performed at [facility] [address], [place], [place] [address]  Phosphorus     Status: None   Collection Time: 07/20/17  3:58 AM  Result Value Ref Range   Phosphorus 2.5 2.5 - 4.6 mg/dL    Comment: Performed at [facility] [address] [place], [place] [address]  Magnesium     Status: None   Collection Time: 07/20/17  3:58 AM  Result Value Ref Range   Magnesium 1.9 1.7 - 2.4 mg/dL    Comment: Performed at [facility] [address], [place], [place] [address]  Glucose, capillary     Status: Abnormal   Collection Time: 07/20/17  4:29 AM  Result Value Ref Range   Glucose-Capillary 119 (H) 65 - 99 mg/dL  Glucose, capillary     Status: Abnormal   Collection Time: 07/20/17  7:39 AM  Result Value Ref Range   Glucose-Capillary 174 (H) 65 - 99 mg/dL  Glucose, capillary     Status: Abnormal   Collection Time: 07/20/17 11:58 AM  Result Value Ref Range   Glucose-Capillary 181 (H) 65 - 99 mg/dL    MICRO: 5/20 blood cx 1 in 6 bottles showing CoNS 5/24 blood cx pending IMAGING: Dg Chest Port  1 View  Result Date:  07/19/2017 CLINICAL DATA:  Respiratory failure EXAM: PORTABLE CHEST 1 VIEW COMPARISON:  07/15/2017 FINDINGS: Cardiac shadow is stable. Endotracheal tube and nasogastric catheter have been removed in the interval. Right jugular central line and right-sided PICC line are again noted and stable. No focal infiltrate or sizable effusion is seen. IMPRESSION: No acute abnormality noted. Electronically Signed   By: Inez Catalina M.D.   On: 07/19/2017 09:07    Assessment/Plan: 52yo F with severe MS, neurogenic bladder, admitted for sepsis on 5/20 with possible sources of urinary origin vs decub ulcer/osteo. thought to be uti but urine cx only showing yeast, not isolated in blood. She also had CoNS bacteremia -thought to be contaminant since only 1 in 6 bottles. I suspect that her decub ulcer/osteo may have played role in her presentation  1) complicated uti = described as purulent thus it potentially is true pathogen. Would treat with 7-10d course of fluconazole  2) sacral decub/osteo = had been on meropenem. Recommend to treat with 4 addn weeks. Then treat with orals thereafter. Would ask wound care RN if patient wound benefit from surgical debridement - if so, would contact general surgery  3) MRSE bacteremia = thought to be contaminant since would expect other bottles to be positive in setting of her admission for sepsis.   Will see back on monday

## 2017-07-20 NOTE — Care Management Note (Signed)
Case Management Note  Patient Details  Name: Debra [name] MRN: [id number] Date of Birth: 11/15/1965  Subjective/Objective:     Pt admitted with sepsis. She is from [facility].                Action/Plan: Plan is for patient to return to [facility] when medically ready. CM following.    Expected Discharge Date:                  Expected Discharge Plan:  Skilled Nursing Facility  In-House Referral:  Clinical Social Work  Discharge planning Services     Post Acute Care Choice:    Choice offered to:     DME Arranged:    DME Agency:     HH Arranged:    HH Agency:     Status of Service:  In process, will continue to follow  If discussed at Long Length of Stay Meetings, dates discussed:    Additional Comments:  Kermit Balo, RN 07/20/2017, 3:30 PM

## 2017-07-20 NOTE — Progress Notes (Addendum)
ICU Transfer Summary: This is a 52 y.o. woman with complicated recent medical history.  She was just in the hospital from 4/30 to 5/9 for CoNS methicillin resistant bacteremia thought to be from an infected PICC line. She also was treated for a sacral wound infection. She was discharged back to her facility with a new PICC line and ordered to receive IV daptomycin and meropenem through May 16 at which time she was to start PO doxycycline BID per recommendations from infectious diseases. It is unclear if this happened, however, her PICC line which was to be removed is still in place. Her hospital course was further complicated by poor PO intake that was felt to be secondary to her depression with other consideration given to possible gastroparesis.  She was re-admitted on 5/19 with septic shock and AMS, requiring intubation, norepinephrine, and ICU level of care. She was covered empirically with meropenem and linezolid. Linezolid was subsequently discontinued on 5/22 and she was continued on meropenem. She was found to have a urine culture positive for 100K colonies of yeast, in the setting of chronic indwelling foley catheter, thus she was started on fluconazole. Her blood pressure and mental status improved and she was successfully extubated on 5/22 and transferred to our service on 5/24. It was noted on further review of her chart that blood cultures grown on admission were 1/2 positive for methicillin resistant coagulase negative staph and that her PICC line from the previous admission remains in place.  Subjective:  Debra [name] was seen lying in bed this morning. She endorses lower abdominal pain. Denies shortness of breath, N/V, or subjective fevers/chills. She states she has been eating and tolerating PO intake.  Objective:  Vital signs in last 24 hours: Vitals:   07/19/17 2005 07/19/17 2140 07/19/17 2333 07/20/17 0500  BP:  (!) 144/80 (!) 145/92   Pulse:  81 78   Resp:  18 18   Temp: 98.1 F  (36.7 C)  97.6 F (36.4 C)   TempSrc: Oral  Oral   SpO2:  100% 100%   Weight:    140 lb 3.4 oz (63.6 kg)  Height:       GEN: Chronically ill appearing female lying in bed in NAD CV: NR & RR, previously documented systolic murmur best heard at RUSB PULM: CTAB, no wheezes or rales ABD: Soft, tender to palpation in bilateral lower quadrants, non-distended, +BS EXT: No LE edema, SCDs in place, bilateral heel booties. PICC line in place in RUE  Assessment/Plan:  Active Problems:   Sepsis (HCC)   Malnutrition of moderate degree  Methicillin resistant CoNS bacteremia, likely 2/2 PICC line from recent admission Chronic sacral osteomyelitis BCx 1/2 positive for methicillin resistant CoNS bacteremia this admission. This is likely the cause of her presenting symptoms and is concerning for retained PICC line as the source of her bacteremia. Currently on empiric meropenem. - ID consulted; appreciate their assistance with appropriate antibiotic regimen - D/c PICC line - Continue IV meropenem 1g q8h - TTE ordered to rule out infective endocarditis - Repeat BCx ordered - Oxy IR 5mg  q6h PRN - Start IV dilaudid 0.5mg  q4h PRN for severe pain - Appreciate wound care recs  100K yeast growth on UCx This is less likely the cause of her presenting symptoms (more likely cause is bacteremia, as above), however this was discussed with ID, who recommends continuing fluconazole for 7 days. She is currently on day 4. - Fluconazole 200mg  daily for 7 days total (5/21->stop date  5/27)  Multiple Sclerosis (?Primary Progressive) Diagnosed in 10/2016. At baseline, she has bilateral LE weakness, neurogenic bladder requiring chronic indwelling foley, and fecal incontinence. She has had a complicated course since diagnosis, requiring multiple re-hospitalizations. Was previously on rituximab infusions for maintenance, however this was discontinued and changed to teriflunomide due to risk of immunosuppression. - Holding  home teriflunomide in setting of bacteremia - Continue modafinil 100mg  daily for fatigue 2/2 MS - Continue home baclofen 5mg  TID - Continue home bethanechol 50mg  TID  Depression - Continue home mirtazapine and duloxetine 30mg  daily  HTN - Continue home amlodipine 10mg  daily, lisinopril 20mg  daily, and metoprolol 100mg  daily  Diabetes She has a history of labile BG, which is thought to be related to poor PO intake and recurrent infections. Will need close monitoring - SSI-M TID WC - CBG monitoring  Dispo: Anticipated discharge pending management of bacteremia  Scherrie Gerlach, MD 07/20/2017, 7:22 AM Pager: [phone]

## 2017-07-20 NOTE — Progress Notes (Signed)
Instructed pt/cg on procedure. Instructed pt to hold breath upon line removal. HOB less than 45*. Pressure held for , no s/sx of bleeding noted. Instructed to leave pressure drsg CDI for 24 hours, monitor and report s/sx of bleeding. Remain in bed for 30 min. Pt/CG VU. Tomasita Morrow, RN VAST

## 2017-07-20 NOTE — Progress Notes (Signed)
Skin assessment done by this nurse and Gilman Schmidt RN. On head to toe assessment, pressure ulcer noted to the sacrum, left buttock bilateral heel and left scapular. Patient resting quietly at this time will continue monitor.

## 2017-07-21 ENCOUNTER — Inpatient Hospital Stay (HOSPITAL_COMMUNITY): Payer: Medicaid Other

## 2017-07-21 DIAGNOSIS — I361 Nonrheumatic tricuspid (valve) insufficiency: Secondary | ICD-10-CM

## 2017-07-21 LAB — GLUCOSE, CAPILLARY
GLUCOSE-CAPILLARY: 262 mg/dL — AB (ref 65–99)
Glucose-Capillary: 149 mg/dL — ABNORMAL HIGH (ref 65–99)
Glucose-Capillary: 169 mg/dL — ABNORMAL HIGH (ref 65–99)
Glucose-Capillary: 279 mg/dL — ABNORMAL HIGH (ref 65–99)

## 2017-07-21 LAB — CULTURE, BLOOD (ROUTINE X 2): Culture: NO GROWTH

## 2017-07-21 LAB — CULTURE, BLOOD (SINGLE)
CULTURE: NO GROWTH
Special Requests: ADEQUATE

## 2017-07-21 LAB — BASIC METABOLIC PANEL
Anion gap: 9 (ref 5–15)
BUN: 19 mg/dL (ref 6–20)
CALCIUM: 8.3 mg/dL — AB (ref 8.9–10.3)
CHLORIDE: 117 mmol/L — AB (ref 101–111)
CO2: 18 mmol/L — AB (ref 22–32)
Creatinine, Ser: 1.53 mg/dL — ABNORMAL HIGH (ref 0.44–1.00)
GFR calc non Af Amer: 39 mL/min — ABNORMAL LOW (ref >60)
GFR, EST AFRICAN AMERICAN: 45 mL/min — AB (ref >60)
Glucose, Bld: 226 mg/dL — ABNORMAL HIGH (ref 65–99)
Potassium: 4.1 mmol/L (ref 3.5–5.1)
SODIUM: 144 mmol/L (ref 135–145)

## 2017-07-21 NOTE — Plan of Care (Signed)
  Problem: Education: Goal: Knowledge of General Education information will improve Outcome: Progressing   Problem: Health Behavior/Discharge Planning: Goal: Ability to manage health-related needs will improve Outcome: Progressing   Problem: Clinical Measurements: Goal: Ability to maintain clinical measurements within normal limits will improve Outcome: Progressing Goal: Will remain free from infection Outcome: Progressing Goal: Diagnostic test results will improve Outcome: Progressing Goal: Respiratory complications will improve Outcome: Progressing Goal: Cardiovascular complication will be avoided Outcome: Progressing   Problem: Activity: Goal: Risk for activity intolerance will decrease Outcome: Progressing   Problem: Nutrition: Goal: Adequate nutrition will be maintained Outcome: Progressing   Problem: Coping: Goal: Level of anxiety will decrease Outcome: Progressing   Problem: Elimination: Goal: Will not experience complications related to bowel motility Outcome: Progressing Goal: Will not experience complications related to urinary retention Outcome: Progressing   Problem: Pain Managment: Goal: General experience of comfort will improve Outcome: Progressing   Problem: Safety: Goal: Ability to remain free from injury will improve Outcome: Progressing   Problem: Skin Integrity: Goal: Risk for impaired skin integrity will decrease Outcome: Progressing   Problem: Fluid Volume: Goal: Hemodynamic stability will improve Outcome: Progressing   Problem: Clinical Measurements: Goal: Diagnostic test results will improve Outcome: Progressing Goal: Signs and symptoms of infection will decrease Outcome: Progressing   Problem: Respiratory: Goal: Ability to maintain adequate ventilation will improve Outcome: Progressing   

## 2017-07-21 NOTE — Progress Notes (Addendum)
Subjective: Doing well this morning, denies fevers / chills. Continues to endorse depressions. Pain is better controlled this AM with IV dilaudid. No other complaints.   Objective:  Vital signs in last 24 hours: Vitals:   07/20/17 1204 07/20/17 1649 07/20/17 2024 07/21/17 0409  BP: 132/79 140/81 117/67 137/69  Pulse: 79 86 88 88  Resp: 16 12 16 18   Temp: 98.4 F (36.9 C) 98.5 F (36.9 C)  98.6 F (37 C)  TempSrc: Oral Oral  Oral  SpO2: 100% 100% 100% 100%  Weight:      Height:       GEN: Chronically ill appearing female lying in bed in NAD CV: NR & RR, previously documented systolic murmur best heard at RUSB PULM: CTAB, no wheezes or rales ABD: Soft, tender to palpation in bilateral lower quadrants, non-distended, +BS. Foley catheter.  EXT: No LE edema, SCDs in place, bilateral heel booties.   Assessment/Plan:  Septic Shock: Now resolved, admitted 5/20 from [facility] with hyopthermia, AMS, and hypotension requiring intubation and pressors. She had a recent admission for methicillin resistant CoNS bacteremia (2/2 BCx) secondary to a PICC line. She was discharged on 5/9 after a line holiday and repeat cultures remained negative x 72 hours. PICC line was replaced before discharge. She was sent back to [facility] on IV daptomycin for her bacteremia and IV meropenum for her chronic sacral osteomyelitis. Plan was to remove PICC line on May 17th and transition to PO doxy BID for 3 months for her sacral wound. She was readmitted 5/20 with severe sepsis, PICC line still in place. Infectious work up this admission with unclear source. Possible sepsis from candidal UTI. Patient was noted to have frank puss from her chronic indwelling foley. I believe this was replaced 5/22. Urine culture grew yeast and she is currently on tx for candidal UTI. She also had 1/3 blood cultures (2 peripheral, 1 central) positive for CoNS. Discussed with ID today; this is likely a contaminant however is concerning given her  degree of sepsis and recent hospitalization for CoNS bacteremia. Recommended PICC line removal which was done 5/24. She also has chronic sacral osteomyelitis, currently on IV meropenum.  -- ID consulted, appreciate recommendations  -- Continue diflucan day (5/7) for candidal UTI  -- Will verify with RN that foley has been exchanged this hospitalization  -- Continue IV meropenum day 7 of 4 weeks per ID for sacral osteo -- Surgery consulted during recent admission 2 weeks ago did not feel patient was a candidate for surgical debridement  -- PICC line removed yesterday due to concern for recurrent bacteremia with 1/3 blood cx (+) for CoNS. Again likely a contaminant, but will f/u repeat BCx  5/24 -- Access has been an issue; Ideally would like to wait for repeat blood cx to return negative prior to replacing PICC. Will ask IV team to attempt peripheral line again today.  -- Echocardiogram   Multiple Sclerosis: Bedbound at baseline with 0/5 LE strength, ~ 2/5 UE strength, neurogenic bladder with indwelling foley catheter.  -- Holding teriflunomide given infection -- Continue home baclofen, bethanechol, and modafinil   Depression - Continue home mirtazapine and duloxetine 30mg  daily  HTN - Continue home amlodipine 10mg  daily, lisinopril 20mg  daily, and metoprolol 100mg  daily  Diabetes She has a history of labile BG, which is thought to be related to poor PO intake and recurrent infections. Will need close monitoring - SSI-M TID WC - CBG monitoring  Dispo: Anticipated discharge in approximately 2-3 days.  Reymundo Poll, MD 07/21/2017, 7:22 AM Pager: [phone]

## 2017-07-21 NOTE — Progress Notes (Signed)
  Echocardiogram 2D Echocardiogram has been performed.  Debra [name] 07/21/2017, 5:46 PM

## 2017-07-22 LAB — BASIC METABOLIC PANEL
Anion gap: 12 (ref 5–15)
BUN: 20 mg/dL (ref 6–20)
CHLORIDE: 119 mmol/L — AB (ref 101–111)
CO2: 14 mmol/L — ABNORMAL LOW (ref 22–32)
Calcium: 8.5 mg/dL — ABNORMAL LOW (ref 8.9–10.3)
Creatinine, Ser: 1.27 mg/dL — ABNORMAL HIGH (ref 0.44–1.00)
GFR calc Af Amer: 56 mL/min — ABNORMAL LOW (ref >60)
GFR calc non Af Amer: 48 mL/min — ABNORMAL LOW (ref >60)
GLUCOSE: 176 mg/dL — AB (ref 65–99)
POTASSIUM: 4.8 mmol/L (ref 3.5–5.1)
Sodium: 145 mmol/L (ref 135–145)

## 2017-07-22 LAB — GLUCOSE, CAPILLARY
GLUCOSE-CAPILLARY: 155 mg/dL — AB (ref 65–99)
GLUCOSE-CAPILLARY: 108 mg/dL — AB (ref 65–99)
GLUCOSE-CAPILLARY: 137 mg/dL — AB (ref 65–99)
Glucose-Capillary: 155 mg/dL — ABNORMAL HIGH (ref 65–99)

## 2017-07-22 LAB — ECHOCARDIOGRAM LIMITED
HEIGHTINCHES: 66 in
WEIGHTICAEL: 2405.66 [oz_av]

## 2017-07-22 MED ORDER — HYDROMORPHONE HCL 1 MG/ML IJ SOLN: 0.5000 mg | INTRAMUSCULAR | Status: DC | PRN

## 2017-07-22 NOTE — Plan of Care (Signed)
  Problem: Education: Goal: Knowledge of General Education information will improve Outcome: Progressing   Problem: Health Behavior/Discharge Planning: Goal: Ability to manage health-related needs will improve Outcome: Progressing   Problem: Clinical Measurements: Goal: Ability to maintain clinical measurements within normal limits will improve Outcome: Progressing Goal: Will remain free from infection Outcome: Progressing Goal: Diagnostic test results will improve Outcome: Progressing Goal: Respiratory complications will improve Outcome: Progressing Goal: Cardiovascular complication will be avoided Outcome: Progressing   Problem: Activity: Goal: Risk for activity intolerance will decrease Outcome: Progressing   Problem: Nutrition: Goal: Adequate nutrition will be maintained Outcome: Progressing   Problem: Coping: Goal: Level of anxiety will decrease Outcome: Progressing   Problem: Elimination: Goal: Will not experience complications related to bowel motility Outcome: Progressing Goal: Will not experience complications related to urinary retention Outcome: Progressing   Problem: Pain Managment: Goal: General experience of comfort will improve Outcome: Progressing   Problem: Safety: Goal: Ability to remain free from injury will improve Outcome: Progressing   Problem: Skin Integrity: Goal: Risk for impaired skin integrity will decrease Outcome: Progressing   Problem: Fluid Volume: Goal: Hemodynamic stability will improve Outcome: Progressing   Problem: Clinical Measurements: Goal: Diagnostic test results will improve Outcome: Progressing Goal: Signs and symptoms of infection will decrease Outcome: Progressing   Problem: Respiratory: Goal: Ability to maintain adequate ventilation will improve Outcome: Progressing   

## 2017-07-22 NOTE — Consult Note (Signed)
WOC Nurse wound consult note  WOC Nurse re consulted 5/26 (Sunday) after my partner, K. Allyne Gee saw patient on 5/24 (Friday). Please see her note.  Orders are active for the care of this ulcer. Reason for Consult: Pressure injury to coccyx. Wound type: Pressure   WOC nursing team will not follow, but will remain available to this patient, the nursing and medical teams.  Please re-consult if needed. Thanks, Ladona Mow, MSN, RN, GNP, [facility] [place]  Pager# [phone]

## 2017-07-22 NOTE — Progress Notes (Signed)
Subjective: Continues to have a very depressed affect, reports that her mood is "so-so". Complaining of pain from her sacral wound.    Objective:  Vital signs in last 24 hours: Vitals:   07/21/17 1643 07/21/17 1956 07/22/17 0449 07/22/17 0905  BP: (!) 145/77 134/79 120/65 128/73  Pulse: 87 77 75 79  Resp: 16 16 16 14   Temp: 98.6 F (37 C)  97.7 F (36.5 C) 97.9 F (36.6 C)  TempSrc: Axillary  Oral Oral  SpO2: 100% 100% 93% 100%  Weight:      Height:       GEN: Chronically ill appearing female lying in bed in NAD CV: NR & RR, previously documented systolic murmur best heard at RUSB PULM: CTAB, no wheezes or rales ABD: Soft, tender to palpation in bilateral lower quadrants, non-distended, +BS. Foley catheter.  GU: Sacral wound covered in fecal material  EXT: No LE edema, SCDs in place, bilateral heel booties.   Assessment/Plan:  Ms. Sudberry is a 52 yo bed bound F with MS, type 1 DM, chronic sacral osteomyelitis, neurogenic bladder with indwelling foley catheter who was admitted 5/20 from her [facility]ACH with hypothermia, AMS, and hypotension requiring intubation and pressors. She had a recent admission for methicillin resistant CoNS bacteremia (2/2 BCx) secondary to a PICC line. She was discharged on 5/9 after a line holiday and repeat cultures remained negative x 72 hours. PICC line was replaced before discharge. She was sent back to [facility] on IV daptomycin for her bacteremia and IV meropenum for her chronic sacral osteomyelitis. Plan was to remove PICC line on May 17th and transition to PO doxy BID for 3 months for her sacral wound. She was readmitted this admission on 5/20 with severe septic shock, PICC line still in place. Infectious work up with multiple potential sources. She is being treated for a candidal UTI. Patient was noted to have frank puss from her indwelling foley which was exchanged 5/22. She also had 1/3 blood cultures (2 peripheral, 1 central) positive for CoNS. This is likely  a contaminant but concerning given her degree of sepsis and recent admission for CoNS bacteremia. PICC line was removed 5/24, repeat culture negative thus far. She also has chronic sacral osteomyelitis on IV meropenum.   Septic Shock: Now resolved. Treating for multiple potential sources.  -- ID consulted, appreciate recommendations  -- Continue diflucan day (6/7) for candidal UTI  -- Foley has was exchanged 5/22 -- Continue IV meropenum day 8 of 4 weeks per ID for sacral osteo -- Surgery consulted during recent admission 2 weeks ago did not feel patient was a candidate for surgical debridement  -- Discussed with patient the option for ostomy and suprapubic catheter placement with the goal to keep her sacral wound clean; She currently does not want to pursue surgery. Will continue to address options and goals & care.  -- PICC line removed 5/24; repeat BCx  5/24 NG < 24 hours -- Plan to replace PICC once NG x 72 hours  -- Echocardiogram read pending   Multiple Sclerosis: Bedbound at baseline with 0/5 LE strength, ~ 2/5 UE strength, neurogenic bladder with indwelling foley catheter. Treatment has been limited due to recurrent infections.  -- Holding teriflunomide given infection -- Continue home baclofen, bethanechol, and modafinil   Depression - Continue home mirtazapine and duloxetine 30mg  daily  HTN - Continue home amlodipine 10mg  daily, lisinopril 20mg  daily, and metoprolol 100mg  daily  Diabetes She has a history of labile BG, which is thought  to be related to poor PO intake and recurrent infections. Will need close monitoring - SSI-M TID WC - CBG monitoring  Dispo: Anticipated discharge in approximately 2 days pending repeat blood cx remain negative & replacement of PICC line.    Reymundo Poll, MD 07/22/2017, 9:37 AM Pager: [phone]

## 2017-07-22 NOTE — Progress Notes (Signed)
Spoke to md guilloud about patients poor PO intake

## 2017-07-23 LAB — GLUCOSE, CAPILLARY
GLUCOSE-CAPILLARY: 163 mg/dL — AB (ref 65–99)
GLUCOSE-CAPILLARY: 114 mg/dL — AB (ref 65–99)
Glucose-Capillary: 218 mg/dL — ABNORMAL HIGH (ref 65–99)
Glucose-Capillary: 127 mg/dL — ABNORMAL HIGH (ref 65–99)

## 2017-07-23 MED ORDER — HYDROMORPHONE HCL 1 MG/ML IJ SOLN
0.5000 mg | Freq: Two times a day (BID) | INTRAMUSCULAR | Status: DC
  Administered 2017-07-24: 0.5 mg via INTRAVENOUS
  Filled 2017-07-23: qty 0.5

## 2017-07-23 NOTE — Discharge Summary (Addendum)
Name: Debra [name] MRN: [id number] DOB: 1965/10/02 52 y.o. PCP: Aldine Contes, MD  Date of Admission: 07/15/2017 11:08 PM Date of Discharge: 07/24/2017 Attending Physician: Axel Filler, *  Discharge Diagnosis: 1. Sepsis 2. Candidal UTI 3. ?Methicillin-resistant CoNS bacteremia 4. Chronic Sacral Osteomyelitis 5. Multiple Sclerosis 6. Neurogenic Bladder 7. Type 1 diabetes  Active Problems:   Sepsis ([place])   Malnutrition of moderate degree   Sacral osteomyelitis (HCC)   Sacral decubitus ulcer, stage IV ([place])   Discharge Medications: Allergies as of 07/24/2017      Reactions   Penicillins Anaphylaxis, Nausea And Vomiting, Rash   Has patient had a PCN reaction causing immediate rash, facial/tongue/throat swelling, SOB or lightheadedness with hypotension: Yes Has patient had a PCN reaction causing severe rash involving mucus membranes or skin necrosis: Yes Has patient had a PCN reaction that required hospitalization No Has patient had a PCN reaction occurring within the last 10 years: Yes If all of the above answers are "NO", then may proceed with Cephalosporin use.   Lactose Intolerance (gi) Other (See Comments)   Pollen Extract Other (See Comments)   Seasonal Allergies   Tape Rash      Medication List    STOP taking these medications   insulin detemir 100 UNIT/ML injection Commonly known as:  LEVEMIR   protein supplement shake Liqd Commonly known as:  PREMIER PROTEIN     TAKE these medications   acetaminophen 325 MG tablet Commonly known as:  TYLENOL Take 650 mg by mouth every 4 (four) hours as needed for mild pain or fever.   Acidophilus 100 MG Caps Take 100 mg by mouth 2 (two) times daily.   amLODipine 10 MG tablet Commonly known as:  NORVASC Take 1 tablet (10 mg total) by mouth daily.   aspirin EC 81 MG tablet Take 1 tablet (81 mg total) by mouth daily.   atorvastatin 40 MG tablet Commonly known as:  LIPITOR Take 1 tablet (40 mg  total) by mouth daily. What changed:  when to take this   Baclofen 5 MG Tabs Take 5 mg by mouth 3 (three) times daily.   bethanechol 50 MG tablet Commonly known as:  URECHOLINE Take 1 tablet (50 mg total) by mouth 3 (three) times daily.   collagenase ointment Commonly known as:  SANTYL Apply topically 2 (two) times daily.   doxycycline 50 MG capsule Commonly known as:  VIBRAMYCIN Take 2 capsules (100 mg total) by mouth 2 (two) times daily. Starting 6/17 Start taking on:  08/13/2017 What changed:    additional instructions  These instructions start on 08/13/2017. If you are unsure what to do until then, ask your doctor or other care provider.   DULoxetine 30 MG capsule Commonly known as:  CYMBALTA Take 1 capsule (30 mg total) by mouth daily.   feeding supplement (GLUCERNA SHAKE) Liqd Take 237 mLs by mouth 2 (two) times daily between meals.   feeding supplement (PRO-STAT SUGAR FREE 64) Liqd Take 30 mLs by mouth 3 (three) times daily between meals.   glucagon 1 MG injection Inject 1 mg into the vein once as needed (for Hypoglycernia).   HYDROmorphone 2 MG tablet Commonly known as:  DILAUDID Take 0.5 tablets (1 mg total) by mouth every 6 (six) hours as needed for up to 5 days for severe pain.   insulin aspart 100 UNIT/ML injection Commonly known as:  novoLOG Inject 4-14 Units into the skin 3 (three) times daily before meals. 150-200=4 units 201-250=6 units  241-300=8 units 301-350=10 units 351-400=12 units 401-450=14 units notify PCP if >451   insulin glargine 100 UNIT/ML injection Commonly known as:  LANTUS Inject 0.05 mLs (5 Units total) into the skin at bedtime.   lidocaine 5 % Commonly known as:  LIDODERM Place 1 patch onto the skin daily. FOR LOWER BACK PAIN Remove & Discard patch within 12 hours or as directed by MD   lisinopril 20 MG tablet Commonly known as:  PRINIVIL,ZESTRIL Take 1 tablet (20 mg total) by mouth daily.   loperamide 2 MG  capsule Commonly known as:  IMODIUM Take 2 capsules (4 mg total) by mouth as needed for diarrhea or loose stools.   magnesium hydroxide 400 MG/5ML suspension Commonly known as:  MILK OF MAGNESIA Take 30 mLs by mouth daily as needed for mild constipation.   meclizine 12.5 MG tablet Commonly known as:  ANTIVERT Take 12.5 mg by mouth 3 (three) times daily as needed for dizziness. per nsg. home MAR, pt. takes 1 tablet 1 hour prior to therapy.   meropenem IVPB Commonly known as:  MERREM Inject 1 g into the vein every 8 (eight) hours for 19 days. Indication:  Sacral osteo Last Day of Therapy:  08/12/17 Labs - Once weekly:  CBC/D and BMP, Labs - Every other week:  ESR and CRP   metoprolol succinate 100 MG 24 hr tablet Commonly known as:  TOPROL-XL Take 1 tablet (100 mg total) by mouth daily. Take with or immediately following a meal.   mirtazapine 45 MG tablet Commonly known as:  REMERON Take 1 tablet (45 mg total) by mouth at bedtime.   modafinil 100 MG tablet Commonly known as:  PROVIGIL Take 1 tablet (100 mg total) by mouth every morning. What changed:  when to take this   multivitamin tablet Take 1 tablet by mouth daily.   ondansetron 4 MG tablet Commonly known as:  ZOFRAN Take 4 mg by mouth daily.   pantoprazole 40 MG tablet Commonly known as:  PROTONIX Take 1 tablet (40 mg total) by mouth daily.   polyethylene glycol packet Commonly known as:  MIRALAX / GLYCOLAX Take 17 g by mouth daily as needed for mild constipation, moderate constipation or severe constipation.   pregabalin 25 MG capsule Commonly known as:  LYRICA Take 25 mg by mouth every 8 (eight) hours.   tamsulosin 0.4 MG Caps capsule Commonly known as:  FLOMAX Take 1 capsule (0.4 mg total) daily by mouth. What changed:  when to take this   Teriflunomide 14 MG Tabs Take 14 mg by mouth daily.   Vitamin D (Ergocalciferol) 50000 units Caps capsule Commonly known as:  DRISDOL Take 1 capsule (50,000 Units  total) by mouth every 7 (seven) days. What changed:  additional instructions            Home Infusion Instuctions  (From admission, onward)        Start     Ordered   07/24/17 0000  Home infusion instructions [facility] May follow [place] Dosing Protocol; May administer Cathflo as needed to maintain patency of vascular access device.; Flushing of vascular access device: per [facility] Protocol: 0.9% NaCl pre/post medica...    Question Answer Comment  Instructions May follow [place] Dosing Protocol   Instructions May administer Cathflo as needed to maintain patency of vascular access device.   Instructions Flushing of vascular access device: per [facility] Protocol: 0.9% NaCl pre/post medication administration and prn patency; Heparin 100 u/ml, 71m for implanted ports and  Heparin 10u/ml, 60m for all other central venous catheters.   Instructions May follow [facility] Anaphylaxis Protocol for First Dose Administration in the home: 0.9% NaCl at 25-50 ml/hr to maintain IV access for protocol meds. Epinephrine 0.3 ml IV/IM PRN and Benadryl 25-50 IV/IM PRN s/s of anaphylaxis.   Instructions [facility] Infusion Coordinator (RN) to assist per patient IV care needs in the home PRN.      07/24/17 1425     Disposition and follow-up:   [facility]  At the hospital follow up visit please address:  Patient has hospital follow-up appointment in Internal Medicine Teaching Service clinic ([address]) on June 4 at 10:15am.  Please ensure patient has outpatient Palliative Care follow up.  1.  Chronic sacral osteomyelitis - Please keep sacral wound clean - Continue IV meropenem 5/19 -> stop date 6/16. - Remove PICC after IV meropenem complete (6/17). - Start PO doxycycline '100mg'$  BID after 4 weeks of IV meropenem (starting 6/17), follow up with ID as outpatient regarding duration of PO antibiotics. -  PRN hydromorphone available for pain  2.  MS - Please ensure follow up with Neurology - Continue to discuss with patient options of ostomy / suprapubic catheter to assist with wound care. - Can uptitrate modafinil if needed for MS fatigue. - Continue PT/OT  3.  Depression - Please assess depression symptoms and titrate anti-depressants as appropriate.  4.  Poor PO intake, thought to be secondary to depression - If no improvement with management of depression, could consider work-up for gastroparesis from diabetes. If still unable to maintain PO intake, may need feeding tube as last resort if patient/family prefers this.  5.  Labs / imaging needed at time of follow-up: None  6.  Pending labs/ test needing follow-up: None  Follow-up Appointments:  Contact information for follow-up providers    NAldine Contes MD. Go on 07/31/2017.   Specialty:  Internal Medicine Why:  at 10:15am Contact information: [address] [place] [phone]           Contact information for after-discharge care    [facility]   Service:  Skilled Nursing Contact information: [address] list: Active Problems:   [place]epsis ([place]   Malnutrition of moderate degree   S[place]ral osteomyelitis ([place]   [place] decubitus ulcer, stage IV ([place]   1. Septic shock, 2/2 candidal UTI vs methicillin resistant CoNS bacteremia Patient was recently hospitalized from 4/30-5/9 for methicillin resistant CoNS bacteremia thought to be from an infected PICC line, as well as chronic sacral wound infection. She was discharged to her facility from previous admission with new PICC line and IV daptomycin and meropenem through May 16, at which time she was to have the PICC line removed and to start PO doxycycline. It is unclear whether she was ever  transitioned to PO doxycycline at her facility and PICC line was still in place.  She was re-admitted this admission on 5/19 with septic shock and AMS, requiring intubation, pressors, and ICU level of care. She was covered empirically with meropenem and linezolid. Linezolid was subsequently discontinued on 5/22 and she was continued only on meropenem. She was found to have  a urine culture positive for 100K colonies of yeast and reported purulent discharge from her chronic indwelling foley catheter, thus she completed a 7 day course of fluconazole for possible candidal UTI. Foley was exchanged on 5/22 and again on 5/28 (see Neurogenic Bladder problem listed below for details). 1/3 blood cultures were positive for CoNS methicillin resistant staph bacteremia, which was a possible contaminant, but given her recent admission, her PICC line was removed on 5/24 and repeat blood cultures were drawn. Infectious endocarditis was ruled out with no valvular vegetations on TTE. Once repeat blood cultures were negative x72 hours, PICC line was replaced on 5/28 and IV meropenem was planned to be continued for 4 weeks for chronic sacral osteomyelitis, as listed in problem listed below. She was stable on discharge back to her LTAC.  Chronic sacral osteomyelitis Patient has chronic sacral osteomyelitis 2/2 being bedbound from [place]. General Surgery was consulted at previous admission and did not feel patient was a candidate for surgical debridement. ID recommended IV meropenem for 4 weeks, per ID recs, for chronic sacral osteomyelitis (5/19 -> stop date 6/16) to be transitioned to PO doxycycline '100mg'$  BID on 6/17. PICC line can be removed after IV antibiotics complete. PRN dilaudid provided for pain control. Recommend diligent wound care to help keep wound clean.  Multiple Sclerosis Diagnosed in 10/2016 and has since had a course complicated by recurrent infections and hospitalizations. She is bedbound at baseline with 1/5 LE  strength, ~2/5 UE strength, and neurogenic bladder with indwelling foley catheter. Follows with Dr. Felecia Shelling in Neurology, but treatment has been limited due to recurrent infections. She is currently on teriflunomide given its decreased immunosuppressive effect. The option for ostomy and suprapubic catheter were discussed with the patient during this admission, which she declined as she would like to avoid further surgeries.  Neurogenic bladder with chronic indwelling foley catheter Nursing staff noted that patient's foley was not draining. She had 100cc in the foley bag, but bladder scan was done, which showed >700cc of retained urine. Foley was able to be flushed, however urine was not draining appropriately. Foley was attempted to be exchanged, however RN noted that when replacing the Foley, the 10cc balloon was inflated, but the Foley would not remain in place. This was discussed with on-call Urologist who recommended putting more fluid in the balloon, as the patient likely has a patulous urethra. A foley catheter with a 20cc balloon was successfully placed on 5/28.  Depression Currently on mirtazapine and duloxetine.  Type 1 diabetes She has a history of labile BG, which is thought to be related to poor PO intake and recurrent infections. Her BG was within an acceptable range while hospitalized. Discharged on home regimen.  Discharge Vitals:   BP 130/68 (BP Location: Left Arm)   Pulse 72   Temp 98.2 F (36.8 C) (Oral)   Resp 18   Ht '5\' 6"'$  (1.676 m)   Wt 144 lb 13.5 oz (65.7 kg)   SpO2 100%   BMI 23.38 kg/m   Pertinent Labs, Studies, and Procedures:  CBC Latest Ref Rng & Units 07/20/2017 07/19/2017 07/18/2017  WBC 4.0 - 10.5 K/uL 9.7 12.8(H) 15.5(H)  Hemoglobin 12.0 - 15.0 g/dL 8.5(L) 8.7(L) 8.9(L)  Hematocrit 36.0 - 46.0 % 26.8(L) 28.0(L) 28.4(L)  Platelets 150 - 400 K/uL 173 215 221   CMP Latest Ref Rng & Units 07/22/2017 07/21/2017 07/20/2017  Glucose 65 - 99 mg/dL 176(H) 226(H) 115(H)    BUN 6 - 20 mg/dL 20 19 15  Creatinine 0.44 - 1.00 mg/dL 1.27(H) 1.53(H) 1.12(H)  Sodium 135 - 145 mmol/L 145 144 144  Potassium 3.5 - 5.1 mmol/L 4.8 4.1 3.9  Chloride 101 - 111 mmol/L 119(H) 117(H) 117(H)  CO2 22 - 32 mmol/L 14(L) 18(L) 20(L)  Calcium 8.9 - 10.3 mg/dL 8.5(L) 8.3(L) 8.3(L)  Total Protein 6.5 - 8.1 g/dL - - -  Total Bilirubin 0.3 - 1.2 mg/dL - - -  Alkaline Phos 38 - 126 U/L - - -  AST 15 - 41 U/L - - -  ALT 14 - 54 U/L - - -   BCx 5/20 -> 1/3 bottles positive for methicillin-resistant CoNS BCx 5/24 -> negative x4 days  Discharge Instructions: Discharge Instructions    Diet - low sodium heart healthy   Complete by:  As directed    Discharge instructions   Complete by:  As directed    Ms. Derstine,  While you were in the hospital, we treated you for an infection that was either from the IV line or from your urine. Once your blood cultures were normal for 72 hours, we put another IV line in, as you will need IV antibiotics. - Please take IV meropenem 1g every 8 hours through June 16. - After June 16, the IV line should be removed. - Then, you will continue doxycycline '100mg'$  twice a day for at least 3 months. - You will need good care to help keep the wound clean, which will help it heal. - We also have some pain medicines ordered for you as needed.  You have an appointment scheduled in our clinic for Tuesday, June 4, at 10:15am. If you have any questions or concerns, call our clinic at [phone] or after hours call [phone] and ask for the internal medicine resident on call.   Home infusion instructions [facility] May follow [place] Dosing Protocol; May administer Cathflo as needed to maintain patency of vascular access device.; Flushing of vascular access device: per [facility] Protocol: 0.9% NaCl pre/post medica...   Complete by:  As directed    Instructions:  May follow [place] Dosing Protocol   Instructions:  May administer Cathflo as needed to  maintain patency of vascular access device.   Instructions:  Flushing of vascular access device: per [facility] Protocol: 0.9% NaCl pre/post medication administration and prn patency; Heparin 100 u/ml, 60m for implanted ports and Heparin 10u/ml, 516mfor all other central venous catheters.   Instructions:  May follow [facility] Anaphylaxis Protocol for First Dose Administration in the home: 0.9% NaCl at 25-50 ml/hr to maintain IV access for protocol meds. Epinephrine 0.3 ml IV/IM PRN and Benadryl 25-50 IV/IM PRN s/s of anaphylaxis.   Instructions:  [facility] Coord[facility] (RN) to assist per patient IV care needs in the home PRN.   Increase activity slowly   Complete by:  As directed      Signed: HuColbert EwingMD 07/24/2017, 4:32 PM   Pager: P Mamie [phone]

## 2017-07-23 NOTE — Progress Notes (Signed)
ATTEMPTED REINSERTION OF FLEXISEAL HOWEVER RECTAL TONE NOT PRESENT TO RETAIN DEVICE IN PLACE AFTER MAX INFLATION. SACRAL BANDAGE SOILED AND LOOSE. REPLACED WITH NEW BANDAGE PER CAREINSTRUCTIONS. WOUND BED BROWNISH PINK, WITH PURULENT EXUDATE. NOTIFIED MEDICAL STAFF OF DIFFICULTY WITH FLEXISEAL. URINARY CATH FLUSHED WITH GOOD RETURN NOTED, NO OBVIOUS OBSTRUCTION NOTED.

## 2017-07-23 NOTE — Progress Notes (Signed)
   Subjective:  Endorses continued pain on her sacrum that is unrelieved by current pain regimen. RN noted patient has poor PO intake. Patient states that the food is bland and thus does not eat much. Asked patient what she would like to eat and she stated she'd have to think about what she wants.  Objective:  Vital signs in last 24 hours: Vitals:   07/22/17 1809 07/22/17 2031 07/23/17 0401 07/23/17 0639  BP: 121/71 110/66 122/67   Pulse: 74 74 73   Resp: 16 18 18    Temp: 97.8 F (36.6 C) 97.7 F (36.5 C) 97.8 F (36.6 C)   TempSrc: Oral Oral Oral   SpO2: 100% 100% 99%   Weight:    144 lb 13.5 oz (65.7 kg)  Height:       GEN: Chronically ill appearing female lying in bed in NAD CV: NR & RR, previously documented systolic murmur best heard at RUSB PULM: CTAB, no wheezes or rales ABD: Soft, non-distended, +BS. Foley catheter. EXT: No LE edema, SCDs in place, bilateral heel booties.   Assessment/Plan:  Ms. Loh is a 52 yo bed bound F with MS, type 1 DM, chronic sacral osteomyelitis, neurogenic bladder with indwelling foley catheter who was admitted 5/20 from her LTACH with hypothermia, AMS, and hypotension requiring intubation and pressors. She was able to be transferred out of the ICU on 5/24. She is being managed for possible methicillin resistant CoNS bacteremia (1/3 blood cultures) possibly 2/2 PICC line or candidal UTI. Currently day 7/7 of diflucan. PICC line was removed 5/24, repeat BCx are negative x48h, and no valvular vegetations on TTE. Once blood cultures negative x72h, will be able to replace PICC and continue 4 weeks of IV meropenem, per ID recs, for chronic sacral osteomyelitis.   Septic Shock, now resolved Treating for multiple potential sources, including possible methicillin resistant CoNS bacteremia or candidal UTI - ID consulted, appreciate recommendations - Last day of fluconazole (7/7) for candidal UTI - Foley was exchanged 5/22 - PICC line removed 5/24; repeat  BCx 5/24 NG x48 hours - Plan to replace PICC once NG x72 hours - SW consulted for return to LTAC  Chronic sacral osteomyelitis Endorses continued sacral pain. On review of administered meds, it does not appear that patient has been getting IV dilaudid. - ID consulted, appreciate recommendations - Wound care consult to clean sacral wound, discussed with RN - PO oxycodone and IV dilaudid PRN for pain, discussed with RN - Continue IV meropenem day 9 of 4 weeks per ID for sacral osteo (5/19 -> stop date 6/16)  Multiple Sclerosis Bedbound at baseline with 1/5 LE strength, ~2/5 UE strength, neurogenic bladder with indwelling foley catheter. Treatment has been limited due to recurrent infections. - Holding teriflunomide given infection - Continue home baclofen, bethanechol, and modafinil - Patient does not want to pursue surgery for ostomy and suprapubic catheter placement. Will continue to address goals of care  Depression - Continue home mirtazapine and duloxetine 30mg  daily  HTN - Continue home amlodipine 10mg  daily, lisinopril 20mg  daily, and metoprolol 100mg  daily  Diabetes She has a history of labile BG, which is thought to be related to poor PO intake and recurrent infections. BG 108-218. Will continue close monitoring - SSI-M TID WC - CBG monitoring  Dispo: Anticipated discharge in approximately 1 day pending repeat blood cx remain negative & replacement of PICC line.  Scherrie Gerlach, MD 07/23/2017, 7:04 AM Pager: [phone]

## 2017-07-24 ENCOUNTER — Telehealth: Payer: Self-pay

## 2017-07-24 ENCOUNTER — Inpatient Hospital Stay: Payer: Self-pay

## 2017-07-24 DIAGNOSIS — E109 Type 1 diabetes mellitus without complications: Secondary | ICD-10-CM

## 2017-07-24 DIAGNOSIS — L89154 Pressure ulcer of sacral region, stage 4: Secondary | ICD-10-CM

## 2017-07-24 DIAGNOSIS — B3749 Other urogenital candidiasis: Secondary | ICD-10-CM

## 2017-07-24 DIAGNOSIS — E739 Lactose intolerance, unspecified: Secondary | ICD-10-CM

## 2017-07-24 DIAGNOSIS — M4628 Osteomyelitis of vertebra, sacral and sacrococcygeal region: Secondary | ICD-10-CM

## 2017-07-24 LAB — GLUCOSE, CAPILLARY
GLUCOSE-CAPILLARY: 210 mg/dL — AB (ref 65–99)
GLUCOSE-CAPILLARY: 220 mg/dL — AB (ref 65–99)
GLUCOSE-CAPILLARY: 122 mg/dL — AB (ref 65–99)

## 2017-07-24 MED ORDER — DOXYCYCLINE HYCLATE 50 MG PO CAPS: 100.0000 mg | ORAL_CAPSULE | Freq: Two times a day (BID) | ORAL | 0 refills | Status: DC

## 2017-07-24 MED ORDER — SODIUM CHLORIDE 0.9% FLUSH: 10.0000 mL | INTRAVENOUS | Status: DC | PRN

## 2017-07-24 MED ORDER — SODIUM CHLORIDE 0.9% FLUSH: 10.0000 mL | Freq: Two times a day (BID) | INTRAVENOUS | Status: DC

## 2017-07-24 MED ORDER — HYDROMORPHONE HCL 2 MG PO TABS: 1.0000 mg | ORAL_TABLET | Freq: Four times a day (QID) | ORAL | 0 refills | Status: AC | PRN

## 2017-07-24 MED ORDER — MEROPENEM 1 G IV SOLR
1.0000 g | Freq: Three times a day (TID) | INTRAVENOUS | Status: DC
  Administered 2017-07-24: 1 g via INTRAVENOUS
  Filled 2017-07-24 (×3): qty 1

## 2017-07-24 MED ORDER — HYDROMORPHONE HCL 1 MG/ML IJ SOLN
1.0000 mg | Freq: Four times a day (QID) | INTRAMUSCULAR | Status: DC
  Administered 2017-07-24 (×2): 1 mg via INTRAVENOUS
  Filled 2017-07-24 (×2): qty 1

## 2017-07-24 MED ORDER — HYDROMORPHONE HCL 1 MG/ML IJ SOLN: 0.5000 mg | Freq: Four times a day (QID) | INTRAMUSCULAR | 0 refills | Status: DC | PRN

## 2017-07-24 MED ORDER — MEROPENEM IV (FOR PTA / DISCHARGE USE ONLY): 1.0000 g | Freq: Three times a day (TID) | INTRAVENOUS | 0 refills | Status: AC

## 2017-07-24 MED ORDER — HEPARIN SOD (PORK) LOCK FLUSH 100 UNIT/ML IV SOLN: 250.0000 [IU] | INTRAVENOUS | Status: DC | PRN

## 2017-07-24 NOTE — Progress Notes (Signed)
Pharmacy Antibiotic Note  Debra [name] is a 52 y.o. female with hx MS, known osteo, recurrent UTIs (ESBL/VRE) admitted on 07/15/2017 with concern for AMS/sepsis. Most recently with ID evaluation of sacral osteomyelitis and an additional 4 weeks of Meropenem were recommended. Pharmacy has been consulted for Meropenem dosing.  Plan: - Resume Meropenem 1g IV every 8 hours - Will complete OPAT orders with stop date of 6/16 per IMTS note - Will continue to follow renal function, culture results, LOT, and antibiotic de-escalation plans   Height: 5\' 6"  (167.6 cm) Weight: 144 lb 13.5 oz (65.7 kg) IBW/kg (Calculated) : 59.3  Temp (24hrs), Avg:98 F (36.7 C), Min:97.8 F (36.6 C), Max:98.1 F (36.7 C)  Recent Labs  Lab 07/17/17 1142 07/18/17 0427 07/19/17 0439 07/20/17 0358 07/21/17 0243 07/22/17 0734  WBC 17.5* 15.5* 12.8* 9.7  --   --   CREATININE 1.69* 1.56* 1.32* 1.12* 1.53* 1.27*    Estimated Creatinine Clearance: 49.6 mL/min (A) (by C-G formula based on SCr of 1.27 mg/dL (H)).    Allergies  Allergen Reactions  . Penicillins Anaphylaxis, Nausea And Vomiting and Rash    Has patient had a PCN reaction causing immediate rash, facial/tongue/throat swelling, SOB or lightheadedness with hypotension: Yes Has patient had a PCN reaction causing severe rash involving mucus membranes or skin necrosis: Yes Has patient had a PCN reaction that required hospitalization No Has patient had a PCN reaction occurring within the last 10 years: Yes If all of the above answers are "NO", then may proceed with Cephalosporin use.   . Lactose Intolerance (Gi) Other (See Comments)  . Pollen Extract Other (See Comments)    Seasonal Allergies  . Tape Rash    Antimicrobials this admission: Linezolid 5/20 >>5/22 Merrem 5/20 >> (6/16) Flucon 5/21>>5/28  Microbiology results: 5/20 MRSA pcr neg 5/20 Blood >> 1/2 MRSE 5/20 Urine >> 100,000 yeast 5/24 BCx >> ngtd  Thank you for allowing pharmacy  to be a part of this patient's care.  Georgina Pillion, PharmD, BCPS Clinical Pharmacist Pager: [phone] Clinical phone for 07/24/2017 from 7a-3:30p: [phone] If after 3:30p, please call main pharmacy at: [phone] 07/24/2017 10:53 AM

## 2017-07-24 NOTE — Clinical Social Work Placement (Signed)
   CLINICAL SOCIAL WORK PLACEMENT  NOTE Pecola Lawless- now [facility] RN to call report to [phone]  Date:  07/24/2017  Patient Details  Name: Debra [name] MRN: [id number] Date of Birth: 09/14/1965  Clinical Social Work is seeking post-discharge placement for this patient at the Skilled  Nursing Facility level of care (*CSW will initial, date and re-position this form in  chart as items are completed):      Patient/family provided with [facility] Clinical Social Work Department's list of facilities offering this level of care within the geographic area requested by the patient (or if unable, by the patient's family).  Yes   Patient/family informed of their freedom to choose among providers that offer the needed level of care, that participate in Medicare, Medicaid or managed care program needed by the patient, have an available bed and are willing to accept the patient.      Patient/family informed of [facility]'s ownership interest in [facility] and [facility], as well as of the fact that they are under no obligation to receive care at these facilities.  PASRR submitted to EDS on       PASRR number received on       Existing PASRR number confirmed on 07/24/17     FL2 transmitted to all facilities in geographic area requested by pt/family on 07/24/17     FL2 transmitted to all facilities within larger geographic area on       Patient informed that his/her managed care company has contracts with or will negotiate with certain facilities, including the following:        Yes   Patient/family informed of bed offers received.  Patient chooses bed at [facility]     Physician recommends and patient chooses bed at      Patient to be transferred to [facility] on 07/24/17.  Patient to be transferred to facility by [facility]     Patient family notified on 07/24/17 of transfer.  Name of family member  notified:        PHYSICIAN       Additional Comment:    _______________________________________________ Doy Hutching, LCSWA 07/24/2017, 3:56 PM

## 2017-07-24 NOTE — Progress Notes (Signed)
PHARMACY CONSULT NOTE FOR:  OUTPATIENT  PARENTERAL ANTIBIOTIC THERAPY (OPAT)  Indication: Sacral osteo Regimen: Meropenem 1g IV every 8 hours End date: 08/12/17  IV antibiotic discharge orders are pended. To discharging provider:  please sign these orders via discharge navigator,  Select New Orders & click on the button choice - Manage This Unsigned Work.     Thank you for allowing pharmacy to be a part of this patient's care.  Rolley Sims 07/24/2017, 10:53 AM

## 2017-07-24 NOTE — Care Management (Signed)
CM called and arranged [facility] for transport to facility. They are set up for 4:30-5pm. All needed d/c information at the desk and bedside RN aware.

## 2017-07-24 NOTE — Telephone Encounter (Signed)
Hospital TOC per dr Renaldo Reel, discharge 07/24/2017, appt 07/31/2017. Please call [phone] for TOC per dr Renaldo Reel. c

## 2017-07-24 NOTE — Progress Notes (Signed)
Discharge to: [facility] Anticipated discharge date: 07/24/17 Transportation by: [facility]  Report #: [phone]  CSW signing off.  Blenda Nicely LCSW [phone]

## 2017-07-24 NOTE — Progress Notes (Signed)
RN bladder scanned patient due to night RN reporting output was decreased. 100 CC were in the urine bag and bladder scan volume was 743 ml. MD Renaldo Reel ) was notified.

## 2017-07-24 NOTE — NC FL2 (Signed)
[place] MEDICAID FL2 LEVEL OF CARE SCREENING TOOL     IDENTIFICATION  Patient Name: Debra [name] Birthdate: [birth year] Sex: female Admission Date (Current Location): 07/15/2017  [facility] [place] and [place] Number:  Producer, television/film/video and Address:  [facility] [place], [address], [place], [place] [address]      Provider Number: [id number]  Attending Physician Name and Address:  Tyson Alias, *  Relative Name and Phone Number:       Current Level of Care: Hospital Recommended Level of Care: Skilled Nursing Facility Prior Approval Number:    Date Approved/Denied:   PASRR Number:    Discharge Plan: SNF    Current Diagnoses: Patient Active Problem List   Diagnosis Date Noted  . Malnutrition of moderate degree 07/17/2017  . Sepsis (HCC) 07/16/2017  . Adjustment disorder with mixed anxiety and depressed mood   . MS (multiple sclerosis) (HCC)   . Palliative care by specialist   . Advance care planning   . Major depressive disorder, recurrent episode, moderate (HCC) 07/02/2017  . Staphylococcus epidermidis sepsis (HCC)   . PICC line infection, subsequent encounter   . Severe sepsis (HCC) 06/26/2017  . Acute metabolic encephalopathy   . Acute respiratory failure (HCC)   . Acute pyelonephritis   . Normocytic anemia 05/25/2017  . Decubitus ulcer of coccygeal region, stage IV (HCC) 05/21/2017  . 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 Proteus infection 05/06/2017  . Foley catheter in place on admission 05/06/2017  . Hyperglycemia 05/05/2017  . Bacteriuria 05/05/2017  . Recurrent UTI   . Palliative care encounter   . Goals of care, counseling/discussion   . Pressure injury of skin 04/14/2017  . Aortic atherosclerosis (HCC) 03/14/2017  . Major depressive disorder, single episode, severe without psychosis (HCC) 01/02/2017  . Dysthymia 12/27/2016  . HTN (hypertension)    . Neurogenic bladder   . Labile blood pressure   . Leukocytosis   . Labile blood glucose   . Sleep disturbance   . Multiple sclerosis (HCC) 11/22/2016  . Thoracic root lesion 11/22/2016  . Neuropathic pain   . Leg weakness, bilateral 11/20/2016  . History of CVA (cerebrovascular accident)   . Uncontrolled type 1 diabetes mellitus with diabetic peripheral neuropathy (HCC)   . Diabetic acidosis (HCC) 11/15/2016  . Back pain 10/31/2016  . AKI (acute kidney injury) (HCC)   . Stable angina pectoris (HCC)   . Vitamin D deficiency 08/31/2014  . 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 Hyperlipidemia 10/06/2013  . Chronic diarrhea 10/19/2010  . Peripheral neuropathy 08/19/2010  . Type 1 diabetes mellitus (HCC) 07/06/2010  . Hypertension 07/06/2010  . Asthma 07/06/2010    Orientation RESPIRATION BLADDER Height & Weight     Self, Time, Situation, Place  Normal Incontinent, Indwelling catheter(catheter placed 07/24/17) Weight: 144 lb 13.5 oz (65.7 kg) Height:  5\' 6"  (167.6 cm)  BEHAVIORAL SYMPTOMS/MOOD NEUROLOGICAL BOWEL NUTRITION STATUS      Incontinent Diet(carb modified)  AMBULATORY STATUS COMMUNICATION OF NEEDS Skin   Total Care Verbally PU Stage and Appropriate Care, Other (Comment)(open wound, buttocks; foam dressing changed daily)       PU Stage 4 Dressing: Daily(sacrum, moist to dry dressing changed daily)               Personal Care Assistance Level of Assistance  Bathing, Feeding, Dressing Bathing Assistance: Maximum assistance Feeding assistance: Maximum assistance Dressing Assistance:  Maximum assistance     Functional Limitations Info  Hearing, Speech, Sight Sight Info: Adequate Hearing Info: Adequate Speech Info: Adequate    SPECIAL CARE FACTORS FREQUENCY                       Contractures Contractures Info: Not present    Additional Factors Info  Code Status, Allergies, Isolation Precautions, Psychotropic, Insulin  Sliding Scale Code Status Info: Full Allergies Info: Penicillins, Lactose Intolerance (Gi), Pollen Extract, Tape Psychotropic Info: Cymbalta 30 mg daily; Remeron 45 mg daily at bedtime Insulin Sliding Scale Info: 0-15 units 3x/day with meals Isolation Precautions Info: Contact precautions, ESBL, VRE     Current Medications (07/24/2017):  This is the current hospital active medication list Current Facility-Administered Medications  Medication Dose Route Frequency Provider Last Rate Last Dose  . 0.9 %  sodium chloride infusion   Intra-arterial PRN Tobey Grim, NP   Stopped at 07/21/17 1859  . 0.9 %  sodium chloride infusion  250 mL Intravenous PRN Tobey Grim, NP      . amLODipine (NORVASC) tablet 10 mg  10 mg Oral Daily Deterding, Dorise Hiss, MD   10 mg at 07/24/17 0935  . atorvastatin (LIPITOR) tablet 40 mg  40 mg Oral QPM Reymundo Poll, MD   40 mg at 07/23/17 1649  . baclofen (LIORESAL) tablet 5 mg  5 mg Oral TID Reymundo Poll, MD   5 mg at 07/24/17 0935  . bethanechol (URECHOLINE) tablet 50 mg  50 mg Oral TID Scherrie Gerlach, MD   50 mg at 07/24/17 0936  . docusate (COLACE) 50 MG/5ML liquid 100 mg  100 mg Per Tube BID Hammonds, Curt Jews, MD   100 mg at 07/24/17 9833  . DULoxetine (CYMBALTA) DR capsule 30 mg  30 mg Oral Daily Reymundo Poll, MD   30 mg at 07/24/17 0936  . enoxaparin (LOVENOX) injection 40 mg  40 mg Subcutaneous Q24H Reymundo Poll, MD   40 mg at 07/23/17 1251  . feeding supplement (GLUCERNA SHAKE) (GLUCERNA SHAKE) liquid 237 mL  237 mL Oral BID BM Reymundo Poll, MD   237 mL at 07/20/17 1833  . feeding supplement (PRO-STAT SUGAR FREE 64) liquid 30 mL  30 mL Oral TID Lynnell Catalan, MD   30 mL at 07/24/17 0935  . HYDROmorphone (DILAUDID) injection 0.5-1 mg  0.5-1 mg Intravenous Q4H PRN Reymundo Poll, MD      . HYDROmorphone (DILAUDID) injection 1 mg  1 mg Intravenous Q6H Reymundo Poll, MD   1 mg at 07/24/17 0932  . insulin aspart  (novoLOG) injection 0-15 Units  0-15 Units Subcutaneous TID WC Reymundo Poll, MD   5 Units at 07/24/17 1130  . ipratropium-albuterol (DUONEB) 0.5-2.5 (3) MG/3ML nebulizer solution 3 mL  3 mL Nebulization Q4H PRN Agarwala, Daleen Bo, MD      . lisinopril (PRINIVIL,ZESTRIL) tablet 20 mg  20 mg Oral Daily Reymundo Poll, MD   20 mg at 07/24/17 0936  . MEDLINE mouth rinse  15 mL Mouth Rinse BID Hammonds, Curt Jews, MD   15 mL at 07/24/17 0013  . meropenem (MERREM) 1 g in sodium chloride 0.9 % 100 mL IVPB  1 g Intravenous Q8H Tyson Alias, MD 200 mL/hr at 07/24/17 1224 1 g at 07/24/17 1224  . metoprolol succinate (TOPROL-XL) 24 hr tablet 100 mg  100 mg Oral Daily Deterding, Dorise Hiss, MD   100 mg at 07/24/17 0936  . mirtazapine (REMERON) tablet 45 mg  45 mg Oral QHS Reymundo Poll, MD   45 mg at 07/24/17 0003  . modafinil (PROVIGIL) tablet 100 mg  100 mg Oral q morning - 10a Reymundo Poll, MD   100 mg at 07/24/17 2992  . multivitamin with minerals tablet 1 tablet  1 tablet Oral Daily Lynnell Catalan, MD   1 tablet at 07/24/17 0935  . oxyCODONE-acetaminophen (PERCOCET/ROXICET) 5-325 MG per tablet 1 tablet  1 tablet Oral Q6H PRN Celine Mans, Rahul P, PA-C   1 tablet at 07/24/17 1223  . sodium chloride flush (NS) 0.9 % injection 10-40 mL  10-40 mL Intracatheter Q12H Hammonds, Curt Jews, MD   10 mL at 07/24/17 0002  . sodium chloride flush (NS) 0.9 % injection 10-40 mL  10-40 mL Intracatheter PRN Hammonds, Curt Jews, MD      . sodium chloride flush (NS) 0.9 % injection 10-40 mL  10-40 mL Intracatheter Q12H Oswaldo Done, Marquita Palms, MD      . sodium chloride flush (NS) 0.9 % injection 10-40 mL  10-40 mL Intracatheter PRN Tyson Alias, MD      . tamsulosin [facility]) capsule 0.4 mg  0.4 mg Oral QPC supper Reymundo Poll, MD   0.4 mg at 07/23/17 1649     Discharge Medications: Please see discharge summary for a list of discharge medications.  Relevant Imaging Results:  Relevant  Lab Results:   Additional Information SS#: [id number] [place], [place]

## 2017-07-24 NOTE — Progress Notes (Signed)
RN inserted 14 french foley with a 30 CC balloon. 400 urine output was returned to the bag. Patient tolerated well.

## 2017-07-24 NOTE — Progress Notes (Signed)
Paged Alinda Money then Samuella Cota, MD about Foley not draining but coming out around the foley. Flushed and inserted it in more. No urine still. Irrigated with 100 ml of sterile water. Pt in a tilt for gravity purposes then will assess again.

## 2017-07-24 NOTE — Progress Notes (Signed)
   Subjective:  Endorses pain in her bilateral legs and in her sacrum that is relieved by dilaudid when she does receive it. Foley with ~100cc in bag, bladder scan revealed >700cc retained urine. Flushing well, but not draining. Attempted to replace Foley, but RN noted that with inflating 10cc balloon, the Foley would not stay in place. Discussed with Urology who recommends more fluid in the balloon to keep the Foley in place.  Objective:  Vital signs in last 24 hours: Vitals:   07/23/17 1611 07/23/17 2043 07/24/17 0010 07/24/17 0430  BP: 121/69 (!) 100/55 107/64 122/60  Pulse: 73 70 74 76  Resp: 16 18 16 18   Temp: 98.1 F (36.7 C) 97.8 F (36.6 C) 98.1 F (36.7 C) 97.9 F (36.6 C)  TempSrc: Oral Oral Oral Oral  SpO2: 100% 100% 99% 100%  Weight:      Height:       GEN: Chronically ill appearing female lying in bed in NAD CV: NR & RR, previously documented systolic murmur best heard at RUSB PULM: CTAB, no wheezes or rales ABD: Soft, non-distended, +BS. Foley catheter. EXT: No LE edema, SCDs in place, bilateral heel booties.  Assessment/Plan:  Ms. Depaul is a 52 yo bed bound F with MS, type 1 DM, chronic sacral osteomyelitis, neurogenic bladder with indwelling foley catheter who was admitted 5/20 from her LTACH with hypothermia, AMS, and hypotension, which has resolved. Repeat blood cultures after PICC line removed for possible methicillin resistant CoNS bacteremia (1/3 blood cultures) negative for 72 hours. PICC replaced today. Will continue 4 weeks of IV meropenem for chronic sacral osteomyelitis and discuss further antibiotic plan with ID.  Septic Shock, now resolved Treating for multiple potential sources, including possible methicillin resistant CoNS bacteremia or candidal UTI - ID consulted, appreciate recommendations - Completed 7 days of fluconazole for possible candidal UTI - Working to replace foley catheter with 20cc balloon - PICC line removed 5/24; repeat BCx 5/24 NG x72  hours - PICC line re-placed 5/28 - Likely discharge to LTAC today  Chronic sacral osteomyelitis - IV dilaudid 0.5mg  BID - PO oxycodone and IV dilaudid PRN for pain - Continue IV meropenem for 4 weeks per ID for sacral osteo (5/19 -> stop date 6/16) - Will discuss further PO antibiotic plan with ID  Multiple Sclerosis Bedbound at baseline with 1/5 LE strength, ~2/5 UE strength, neurogenic bladder with indwelling foley catheter. Treatment has been limited due to recurrent infections. - Holding teriflunomide given infection - Continue home baclofen, bethanechol, and modafinil - Patient does not want to pursue surgery for ostomy and suprapubic catheter placement - Replacing foley today, as above  Depression - Continue home mirtazapine and duloxetine 30mg  daily  HTN - Continue home amlodipine 10mg  daily, lisinopril 20mg  daily, and metoprolol 100mg  daily  Diabetes She has a history of labile BG, which is thought to be related to poor PO intake and recurrent infections. BG 114-220. - SSI-M TID WC - CBG monitoring  Dispo: Anticipated discharge today pending replacement of Foley catheter.  Scherrie Gerlach, MD 07/24/2017, 6:31 AM Pager: [phone]

## 2017-07-24 NOTE — Consult Note (Signed)
WOC Nurse wound consult note Reason for Consult: Chronic full thickness wounds to sacrum and left ischium  Has completed three days of Dakins to manage bioburden and odor. Wound type: Stage 4 pressure injuries Pressure Injury POA: Yes Measurement:see previous note Wound bed: pale pink and nongranulating Drainage (amount, consistency, odor) minimal serosanguinous Periwound: intact  Copious loose stool and difficulty maintaining flexiseal.  Stool contamination is likely until fecal management system is intact.  Dressing procedure/placement/frequency: Switch to NS moist kerlix and ABD pads/tape.  Will not follow at this time.  Please re-consult if needed.  Maple Hudson RN BSN CWON Pager [phone]

## 2017-07-24 NOTE — Progress Notes (Signed)
Peripherally Inserted Central Catheter/Midline Placement  The IV Nurse has discussed with the patient and/or persons authorized to consent for the patient, the purpose of this procedure and the potential benefits and risks involved with this procedure.  The benefits include less needle sticks, lab draws from the catheter, and the patient may be discharged home with the catheter. Risks include, but not limited to, infection, bleeding, blood clot (thrombus formation), and puncture of an artery; nerve damage and irregular heartbeat and possibility to perform a PICC exchange if needed/ordered by physician.  Alternatives to this procedure were also discussed.  Bard Power PICC patient education guide, fact sheet on infection prevention and patient information card has been provided to patient /or left at bedside.    PICC/Midline Placement Documentation  PICC Single Lumen 07/24/17 PICC Right Basilic 31 cm 0 cm (Active)  Indication for Insertion or Continuance of Line Home intravenous therapies (PICC only) 07/24/2017 11:58 AM  Exposed Catheter (cm) 0 cm 07/24/2017 11:58 AM  Site Assessment Clean;Dry;Intact 07/24/2017 11:58 AM  Line Status Flushed;Saline locked;Blood return noted 07/24/2017 11:58 AM  Dressing Type Transparent 07/24/2017 11:58 AM  Dressing Status Clean;Dry;Intact;Antimicrobial disc in place 07/24/2017 11:58 AM  Dressing Intervention New dressing 07/24/2017 11:58 AM  Dressing Change Due 07/31/17 07/24/2017 11:58 AM       Ethelda Chick 07/24/2017, 12:00 PM

## 2017-07-25 LAB — CULTURE, BLOOD (ROUTINE X 2)
CULTURE: NO GROWTH
Culture: NO GROWTH
Special Requests: ADEQUATE

## 2017-07-26 NOTE — Telephone Encounter (Signed)
Called ph# provided and was told by a staff member at facility that pt is unable to speak with someone at this time

## 2017-07-31 ENCOUNTER — Ambulatory Visit: Payer: Self-pay

## 2017-07-31 ENCOUNTER — Encounter: Payer: Self-pay | Admitting: Internal Medicine

## 2017-08-01 ENCOUNTER — Non-Acute Institutional Stay: Payer: Self-pay | Admitting: Hospice and Palliative Medicine

## 2017-08-01 DIAGNOSIS — Z515 Encounter for palliative care: Secondary | ICD-10-CM

## 2017-08-02 NOTE — Progress Notes (Signed)
  PALLIATIVE CARE CONSULT VISIT   PATIENT NAME: Debra [name] DOB: 05/03/1965 MRN: [id number]  PRIMARY CARE PROVIDER:   Narendra, Nischal, MD  REFERRING PROVIDER:      No referring provider defined for this encounter.  RESPONSIBLE PARTY:   Self  ASSESSMENT:  Case discussed with primary NP and then I met with patient. Oral intake remains quite poor (<25% of meals and sometimes just bites/sips). Patient reports ongoing dysphagia. She continues to have a severely flat affect and endorses ongoing depression. I had recommended psych but it is unclear if they have seen her yet. PCP is going to reconsult.   I spoke with patient about her goals and she is not always fully consistent. She recognized that her health is declining and that should decline persist, she might ultimately die. We talked about hospice and she says she knows they are a service to care for people in the "last stages of life." She then verbalized that it was ok for hospice to be involved in her care. However, patient then told me she would be accepting of a feeding tube and would still want to be hospitalized and have IV antibiotics if needed. I explained those medical interventions are not consistent with the plan of care under hospice. Patient ultimately seemed committed to ongoing medical workup and treatment.   I will follow and continue to talk with her about her goals.    RECOMMENDATIONS and PLAN:  1. FTT - may need a PEG 2. Depression - Reconsult psychiatry 3. Pain - stable 4. GOC - will need ongoing conversations  I spent 30 minutes providing this consultation,  from 1400 to 1430. More than 50% of the time in this consultation was spent coordinating communication.   HISTORY OF PRESENT ILLNESS:  Pierra Lynette Cowher is a 52 y.o. year old female with multiple medical problems including MS, T1DM, depression, dysphagia, who was recently hospitalized 06/26/17 to 07/05/17 with MRSA bacteremia presumably from sacral  wound/PICC. Patient was seen in consultation by PMT and Palliative Care was asked to follow at SNF. Staff report that patient continues to eat poorly.   CODE STATUS: FC  PPS: 30% HOSPICE ELIGIBILITY/DIAGNOSIS: TBD  PAST MEDICAL HISTORY:  Past Medical History:  Diagnosis Date  . Adenomatous colonic polyps   . Anemia    2005  . Anxiety    1990  . Arthritis    "knees, hands" (05/07/2017)  . Asthma    2000  . Benign essential HTN   . Cataract   . Chronic lower back pain   . Decubital ulcer   . Depression with psychotic symptoms   . Diabetic ketoacidosis without coma associated with type 1 diabetes mellitus (HCC)   . Difficult intubation    narrow airway  . Fibromyalgia 11/07/2016  . Gastroesophageal Reflux Disease (GERD)   . Heart murmur    Birth  . Hyperlipidemia    2005  . Hypertension    1998  . Internal hemorrhoids 04/24/06   on colonoscopy  . Migraine    "used to have them all the time; now maybe q6 months" (05/07/2017)  . Multiple sclerosis (HCC)   . Neuropathic bladder   . Neuropathy of the hands & feet   . Restless Leg Syndrome   . Sepsis due to urinary tract infection (HCC) 04/12/2017  . Sleep paralysis   . Stable angina pectoris    2007: cath showing normal cors.   . Stroke 1990   denies residual on 05/07/2017  .   Type I Diabetes Mellitus 1988    SOCIAL HX:  Social History   Tobacco Use  . Smoking status: Never Smoker  . Smokeless tobacco: Never Used  Substance Use Topics  . Alcohol use: No    Alcohol/week: 0.0 oz    ALLERGIES:  Allergies  Allergen Reactions  . Penicillins Anaphylaxis, Nausea And Vomiting and Rash    Has patient had a PCN reaction causing immediate rash, facial/tongue/throat swelling, SOB or lightheadedness with hypotension: Yes Has patient had a PCN reaction causing severe rash involving mucus membranes or skin necrosis: Yes Has patient had a PCN reaction that required hospitalization No Has patient had a PCN reaction occurring  within the last 10 years: Yes If all of the above answers are "NO", then may proceed with Cephalosporin use.   . Lactose Intolerance (Gi) Other (See Comments)  . Pollen Extract Other (See Comments)    Seasonal Allergies  . Tape Rash     PERTINENT MEDICATIONS:  Outpatient Encounter Medications as of 08/01/2017  Medication Sig  . acetaminophen (TYLENOL) 325 MG tablet Take 650 mg by mouth every 4 (four) hours as needed for mild pain or fever.   . Amino Acids-Protein Hydrolys (FEEDING SUPPLEMENT, PRO-STAT SUGAR FREE 64,) LIQD Take 30 mLs by mouth 3 (three) times daily between meals.  . amLODipine (NORVASC) 10 MG tablet Take 1 tablet (10 mg total) by mouth daily.  . aspirin EC 81 MG tablet Take 1 tablet (81 mg total) by mouth daily. (Patient not taking: Reported on 07/16/2017)  . atorvastatin (LIPITOR) 40 MG tablet Take 1 tablet (40 mg total) by mouth daily. (Patient taking differently: Take 40 mg by mouth every evening. )  . Baclofen 5 MG TABS Take 5 mg by mouth 3 (three) times daily.  . bethanechol (URECHOLINE) 50 MG tablet Take 1 tablet (50 mg total) by mouth 3 (three) times daily.  . collagenase (SANTYL) ointment Apply topically 2 (two) times daily. (Patient not taking: Reported on 06/26/2017)  . [START ON 08/13/2017] doxycycline (VIBRAMYCIN) 50 MG capsule Take 2 capsules (100 mg total) by mouth 2 (two) times daily. Starting 6/17  . DULoxetine (CYMBALTA) 30 MG capsule Take 1 capsule (30 mg total) by mouth daily.  . feeding supplement, GLUCERNA SHAKE, (GLUCERNA SHAKE) LIQD Take 237 mLs by mouth 2 (two) times daily between meals.  . glucagon 1 MG injection Inject 1 mg into the vein once as needed (for Hypoglycernia).  . insulin aspart (NOVOLOG) 100 UNIT/ML injection Inject 4-14 Units into the skin 3 (three) times daily before meals. 150-200=4 units 201-250=6 units 241-300=8 units 301-350=10 units 351-400=12 units 401-450=14 units notify PCP if >451  . insulin glargine (LANTUS) 100 UNIT/ML  injection Inject 0.05 mLs (5 Units total) into the skin at bedtime.  . Lactobacillus (ACIDOPHILUS) 100 MG CAPS Take 100 mg by mouth 2 (two) times daily.  . lidocaine (LIDODERM) 5 % Place 1 patch onto the skin daily. FOR LOWER BACK PAIN Remove & Discard patch within 12 hours or as directed by MD  . lisinopril (PRINIVIL,ZESTRIL) 20 MG tablet Take 1 tablet (20 mg total) by mouth daily.  . loperamide (IMODIUM) 2 MG capsule Take 2 capsules (4 mg total) by mouth as needed for diarrhea or loose stools.  . magnesium hydroxide (MILK OF MAGNESIA) 400 MG/5ML suspension Take 30 mLs by mouth daily as needed for mild constipation.  . meclizine (ANTIVERT) 12.5 MG tablet Take 12.5 mg by mouth 3 (three) times daily as needed for dizziness. per nsg. home   MAR, pt. takes 1 tablet 1 hour prior to therapy.  . meropenem (MERREM) IVPB Inject 1 g into the vein every 8 (eight) hours for 19 days. Indication:  Sacral osteo Last Day of Therapy:  08/12/17 Labs - Once weekly:  CBC/D and BMP, Labs - Every other week:  ESR and CRP  . metoprolol succinate (TOPROL-XL) 100 MG 24 hr tablet Take 1 tablet (100 mg total) by mouth daily. Take with or immediately following a meal.  . mirtazapine (REMERON) 45 MG tablet Take 1 tablet (45 mg total) by mouth at bedtime.  . modafinil (PROVIGIL) 100 MG tablet Take 1 tablet (100 mg total) by mouth every morning. (Patient taking differently: Take 100 mg by mouth daily. )  . Multiple Vitamin (MULTIVITAMIN) tablet Take 1 tablet by mouth daily.  . ondansetron (ZOFRAN) 4 MG tablet Take 4 mg by mouth daily.  . pantoprazole (PROTONIX) 40 MG tablet Take 1 tablet (40 mg total) by mouth daily.  . polyethylene glycol (MIRALAX / GLYCOLAX) packet Take 17 g by mouth daily as needed for mild constipation, moderate constipation or severe constipation.  . pregabalin (LYRICA) 25 MG capsule Take 25 mg by mouth every 8 (eight) hours.   . tamsulosin (FLOMAX) 0.4 MG CAPS capsule Take 1 capsule (0.4 mg total) daily by  mouth. (Patient taking differently: Take 0.4 mg by mouth daily after supper. )  . Teriflunomide 14 MG TABS Take 14 mg by mouth daily.  . Vitamin D, Ergocalciferol, (DRISDOL) 50000 units CAPS capsule Take 1 capsule (50,000 Units total) by mouth every 7 (seven) days. (Patient taking differently: Take 50,000 Units by mouth every 7 (seven) days. On Friday)   No facility-administered encounter medications on file as of 08/01/2017.     PHYSICAL EXAM:   General: NAD, frail appearing, thin, lying in bed Cardiovascular: regular rate and rhythm Pulmonary: clear ant fields Abdomen: soft, nontender Skin: wounds noted but not visualized  Neurological: Weakness, speech clear, follows commands PSYCH: flat affect  Irean Hong, NP

## 2017-08-08 NOTE — Telephone Encounter (Signed)
Pt. No showed her HFU appt on 07/31/2017.

## 2017-08-18 ENCOUNTER — Inpatient Hospital Stay (HOSPITAL_COMMUNITY)
Admission: EM | Admit: 2017-08-18 | Discharge: 2017-09-07 | DRG: 871 | Disposition: A | Discharge status: Hospice | Payer: Medicaid Other | Source: Skilled Nursing Facility | Attending: Internal Medicine | Admitting: Internal Medicine

## 2017-08-18 ENCOUNTER — Encounter (HOSPITAL_COMMUNITY): Payer: Self-pay | Admitting: Emergency Medicine

## 2017-08-18 ENCOUNTER — Emergency Department (HOSPITAL_COMMUNITY): Payer: Medicaid Other

## 2017-08-18 DIAGNOSIS — M25552 Pain in left hip: Secondary | ICD-10-CM | POA: Diagnosis not present

## 2017-08-18 DIAGNOSIS — R6521 Severe sepsis with septic shock: Secondary | ICD-10-CM

## 2017-08-18 DIAGNOSIS — R1310 Dysphagia, unspecified: Secondary | ICD-10-CM | POA: Diagnosis not present

## 2017-08-18 DIAGNOSIS — N179 Acute kidney failure, unspecified: Secondary | ICD-10-CM

## 2017-08-18 DIAGNOSIS — I130 Hypertensive heart and chronic kidney disease with heart failure and stage 1 through stage 4 chronic kidney disease, or unspecified chronic kidney disease: Secondary | ICD-10-CM | POA: Diagnosis present

## 2017-08-18 DIAGNOSIS — R310 Gross hematuria: Secondary | ICD-10-CM | POA: Diagnosis not present

## 2017-08-18 DIAGNOSIS — R748 Abnormal levels of other serum enzymes: Secondary | ICD-10-CM | POA: Diagnosis present

## 2017-08-18 DIAGNOSIS — E1169 Type 2 diabetes mellitus with other specified complication: Secondary | ICD-10-CM | POA: Diagnosis not present

## 2017-08-18 DIAGNOSIS — B370 Candidal stomatitis: Secondary | ICD-10-CM | POA: Diagnosis not present

## 2017-08-18 DIAGNOSIS — D61818 Other pancytopenia: Secondary | ICD-10-CM | POA: Diagnosis present

## 2017-08-18 DIAGNOSIS — A419 Sepsis, unspecified organism: Secondary | ICD-10-CM | POA: Diagnosis not present

## 2017-08-18 DIAGNOSIS — Z9851 Tubal ligation status: Secondary | ICD-10-CM

## 2017-08-18 DIAGNOSIS — N39498 Other specified urinary incontinence: Secondary | ICD-10-CM | POA: Diagnosis present

## 2017-08-18 DIAGNOSIS — I4891 Unspecified atrial fibrillation: Secondary | ICD-10-CM | POA: Diagnosis present

## 2017-08-18 DIAGNOSIS — L89613 Pressure ulcer of right heel, stage 3: Secondary | ICD-10-CM | POA: Diagnosis present

## 2017-08-18 DIAGNOSIS — M869 Osteomyelitis, unspecified: Secondary | ICD-10-CM | POA: Diagnosis present

## 2017-08-18 DIAGNOSIS — I5030 Unspecified diastolic (congestive) heart failure: Secondary | ICD-10-CM | POA: Diagnosis present

## 2017-08-18 DIAGNOSIS — Z803 Family history of malignant neoplasm of breast: Secondary | ICD-10-CM

## 2017-08-18 DIAGNOSIS — Y95 Nosocomial condition: Secondary | ICD-10-CM | POA: Diagnosis present

## 2017-08-18 DIAGNOSIS — M25551 Pain in right hip: Secondary | ICD-10-CM | POA: Diagnosis present

## 2017-08-18 DIAGNOSIS — E162 Hypoglycemia, unspecified: Secondary | ICD-10-CM

## 2017-08-18 DIAGNOSIS — Z66 Do not resuscitate: Secondary | ICD-10-CM | POA: Diagnosis not present

## 2017-08-18 DIAGNOSIS — M7989 Other specified soft tissue disorders: Secondary | ICD-10-CM | POA: Diagnosis not present

## 2017-08-18 DIAGNOSIS — E10649 Type 1 diabetes mellitus with hypoglycemia without coma: Secondary | ICD-10-CM | POA: Diagnosis present

## 2017-08-18 DIAGNOSIS — E1069 Type 1 diabetes mellitus with other specified complication: Secondary | ICD-10-CM | POA: Diagnosis not present

## 2017-08-18 DIAGNOSIS — G9341 Metabolic encephalopathy: Secondary | ICD-10-CM | POA: Diagnosis present

## 2017-08-18 DIAGNOSIS — E1036 Type 1 diabetes mellitus with diabetic cataract: Secondary | ICD-10-CM | POA: Diagnosis present

## 2017-08-18 DIAGNOSIS — J189 Pneumonia, unspecified organism: Secondary | ICD-10-CM | POA: Diagnosis present

## 2017-08-18 DIAGNOSIS — F332 Major depressive disorder, recurrent severe without psychotic features: Secondary | ICD-10-CM | POA: Diagnosis present

## 2017-08-18 DIAGNOSIS — B9689 Other specified bacterial agents as the cause of diseases classified elsewhere: Secondary | ICD-10-CM | POA: Diagnosis not present

## 2017-08-18 DIAGNOSIS — B961 Klebsiella pneumoniae [K. pneumoniae] as the cause of diseases classified elsewhere: Secondary | ICD-10-CM | POA: Diagnosis not present

## 2017-08-18 DIAGNOSIS — Z79899 Other long term (current) drug therapy: Secondary | ICD-10-CM | POA: Diagnosis not present

## 2017-08-18 DIAGNOSIS — E876 Hypokalemia: Secondary | ICD-10-CM | POA: Diagnosis present

## 2017-08-18 DIAGNOSIS — Z800 Family history of malignant neoplasm of digestive organs: Secondary | ICD-10-CM

## 2017-08-18 DIAGNOSIS — N170 Acute kidney failure with tubular necrosis: Secondary | ICD-10-CM | POA: Diagnosis present

## 2017-08-18 DIAGNOSIS — Z8619 Personal history of other infectious and parasitic diseases: Secondary | ICD-10-CM

## 2017-08-18 DIAGNOSIS — F339 Major depressive disorder, recurrent, unspecified: Secondary | ICD-10-CM | POA: Diagnosis not present

## 2017-08-18 DIAGNOSIS — D631 Anemia in chronic kidney disease: Secondary | ICD-10-CM | POA: Diagnosis present

## 2017-08-18 DIAGNOSIS — R110 Nausea: Secondary | ICD-10-CM | POA: Diagnosis not present

## 2017-08-18 DIAGNOSIS — T68XXXA Hypothermia, initial encounter: Secondary | ICD-10-CM

## 2017-08-18 DIAGNOSIS — Z6824 Body mass index (BMI) 24.0-24.9, adult: Secondary | ICD-10-CM

## 2017-08-18 DIAGNOSIS — L89154 Pressure ulcer of sacral region, stage 4: Secondary | ICD-10-CM | POA: Diagnosis present

## 2017-08-18 DIAGNOSIS — Z841 Family history of disorders of kidney and ureter: Secondary | ICD-10-CM

## 2017-08-18 DIAGNOSIS — Z515 Encounter for palliative care: Secondary | ICD-10-CM | POA: Diagnosis not present

## 2017-08-18 DIAGNOSIS — N182 Chronic kidney disease, stage 2 (mild): Secondary | ICD-10-CM | POA: Diagnosis not present

## 2017-08-18 DIAGNOSIS — I10 Essential (primary) hypertension: Secondary | ICD-10-CM | POA: Diagnosis present

## 2017-08-18 DIAGNOSIS — G8929 Other chronic pain: Secondary | ICD-10-CM | POA: Diagnosis not present

## 2017-08-18 DIAGNOSIS — N390 Urinary tract infection, site not specified: Secondary | ICD-10-CM | POA: Diagnosis present

## 2017-08-18 DIAGNOSIS — Z794 Long term (current) use of insulin: Secondary | ICD-10-CM

## 2017-08-18 DIAGNOSIS — I700 Atherosclerosis of aorta: Secondary | ICD-10-CM | POA: Diagnosis present

## 2017-08-18 DIAGNOSIS — T3695XA Adverse effect of unspecified systemic antibiotic, initial encounter: Secondary | ICD-10-CM | POA: Diagnosis not present

## 2017-08-18 DIAGNOSIS — D72819 Decreased white blood cell count, unspecified: Secondary | ICD-10-CM | POA: Diagnosis not present

## 2017-08-18 DIAGNOSIS — Z960 Presence of urogenital implants: Secondary | ICD-10-CM | POA: Diagnosis present

## 2017-08-18 DIAGNOSIS — F329 Major depressive disorder, single episode, unspecified: Secondary | ICD-10-CM | POA: Diagnosis not present

## 2017-08-18 DIAGNOSIS — E1065 Type 1 diabetes mellitus with hyperglycemia: Secondary | ICD-10-CM | POA: Diagnosis not present

## 2017-08-18 DIAGNOSIS — R8271 Bacteriuria: Secondary | ICD-10-CM | POA: Diagnosis not present

## 2017-08-18 DIAGNOSIS — L89893 Pressure ulcer of other site, stage 3: Secondary | ICD-10-CM | POA: Diagnosis present

## 2017-08-18 DIAGNOSIS — Z1612 Extended spectrum beta lactamase (ESBL) resistance: Secondary | ICD-10-CM | POA: Diagnosis not present

## 2017-08-18 DIAGNOSIS — M4628 Osteomyelitis of vertebra, sacral and sacrococcygeal region: Secondary | ICD-10-CM | POA: Diagnosis present

## 2017-08-18 DIAGNOSIS — R011 Cardiac murmur, unspecified: Secondary | ICD-10-CM | POA: Diagnosis not present

## 2017-08-18 DIAGNOSIS — D696 Thrombocytopenia, unspecified: Secondary | ICD-10-CM | POA: Diagnosis present

## 2017-08-18 DIAGNOSIS — E785 Hyperlipidemia, unspecified: Secondary | ICD-10-CM | POA: Diagnosis present

## 2017-08-18 DIAGNOSIS — I82611 Acute embolism and thrombosis of superficial veins of right upper extremity: Secondary | ICD-10-CM | POA: Diagnosis not present

## 2017-08-18 DIAGNOSIS — Z95828 Presence of other vascular implants and grafts: Secondary | ICD-10-CM

## 2017-08-18 DIAGNOSIS — Z880 Allergy status to penicillin: Secondary | ICD-10-CM

## 2017-08-18 DIAGNOSIS — E1022 Type 1 diabetes mellitus with diabetic chronic kidney disease: Secondary | ICD-10-CM | POA: Diagnosis present

## 2017-08-18 DIAGNOSIS — R64 Cachexia: Secondary | ICD-10-CM | POA: Diagnosis present

## 2017-08-18 DIAGNOSIS — J181 Lobar pneumonia, unspecified organism: Secondary | ICD-10-CM | POA: Diagnosis present

## 2017-08-18 DIAGNOSIS — Z8041 Family history of malignant neoplasm of ovary: Secondary | ICD-10-CM

## 2017-08-18 DIAGNOSIS — Z91011 Allergy to milk products: Secondary | ICD-10-CM

## 2017-08-18 DIAGNOSIS — J301 Allergic rhinitis due to pollen: Secondary | ICD-10-CM | POA: Diagnosis present

## 2017-08-18 DIAGNOSIS — R112 Nausea with vomiting, unspecified: Secondary | ICD-10-CM

## 2017-08-18 DIAGNOSIS — R197 Diarrhea, unspecified: Secondary | ICD-10-CM | POA: Diagnosis not present

## 2017-08-18 DIAGNOSIS — Z9049 Acquired absence of other specified parts of digestive tract: Secondary | ICD-10-CM

## 2017-08-18 DIAGNOSIS — R159 Full incontinence of feces: Secondary | ICD-10-CM | POA: Diagnosis present

## 2017-08-18 DIAGNOSIS — A4159 Other Gram-negative sepsis: Secondary | ICD-10-CM | POA: Diagnosis present

## 2017-08-18 DIAGNOSIS — E1042 Type 1 diabetes mellitus with diabetic polyneuropathy: Secondary | ICD-10-CM | POA: Diagnosis present

## 2017-08-18 DIAGNOSIS — F801 Expressive language disorder: Secondary | ICD-10-CM | POA: Diagnosis not present

## 2017-08-18 DIAGNOSIS — G35 Multiple sclerosis: Secondary | ICD-10-CM | POA: Diagnosis not present

## 2017-08-18 DIAGNOSIS — D649 Anemia, unspecified: Secondary | ICD-10-CM | POA: Diagnosis not present

## 2017-08-18 DIAGNOSIS — J029 Acute pharyngitis, unspecified: Secondary | ICD-10-CM | POA: Diagnosis not present

## 2017-08-18 DIAGNOSIS — Z91048 Other nonmedicinal substance allergy status: Secondary | ICD-10-CM

## 2017-08-18 DIAGNOSIS — E872 Acidosis: Secondary | ICD-10-CM

## 2017-08-18 DIAGNOSIS — K521 Toxic gastroenteritis and colitis: Secondary | ICD-10-CM | POA: Diagnosis not present

## 2017-08-18 DIAGNOSIS — R32 Unspecified urinary incontinence: Secondary | ICD-10-CM | POA: Diagnosis not present

## 2017-08-18 DIAGNOSIS — Z7189 Other specified counseling: Secondary | ICD-10-CM | POA: Diagnosis not present

## 2017-08-18 DIAGNOSIS — Z86010 Personal history of colonic polyps: Secondary | ICD-10-CM

## 2017-08-18 DIAGNOSIS — E559 Vitamin D deficiency, unspecified: Secondary | ICD-10-CM | POA: Diagnosis present

## 2017-08-18 DIAGNOSIS — E10622 Type 1 diabetes mellitus with other skin ulcer: Secondary | ICD-10-CM

## 2017-08-18 DIAGNOSIS — R680 Hypothermia, not associated with low environmental temperature: Secondary | ICD-10-CM | POA: Diagnosis not present

## 2017-08-18 DIAGNOSIS — Z7401 Bed confinement status: Secondary | ICD-10-CM

## 2017-08-18 DIAGNOSIS — E11622 Type 2 diabetes mellitus with other skin ulcer: Secondary | ICD-10-CM | POA: Diagnosis not present

## 2017-08-18 DIAGNOSIS — K769 Liver disease, unspecified: Secondary | ICD-10-CM | POA: Diagnosis present

## 2017-08-18 DIAGNOSIS — E43 Unspecified severe protein-calorie malnutrition: Secondary | ICD-10-CM | POA: Diagnosis present

## 2017-08-18 DIAGNOSIS — Y92230 Patient room in hospital as the place of occurrence of the external cause: Secondary | ICD-10-CM | POA: Diagnosis not present

## 2017-08-18 DIAGNOSIS — R601 Generalized edema: Secondary | ICD-10-CM | POA: Diagnosis not present

## 2017-08-18 DIAGNOSIS — K219 Gastro-esophageal reflux disease without esophagitis: Secondary | ICD-10-CM | POA: Diagnosis present

## 2017-08-18 DIAGNOSIS — Z7982 Long term (current) use of aspirin: Secondary | ICD-10-CM

## 2017-08-18 DIAGNOSIS — Z5329 Procedure and treatment not carried out because of patient's decision for other reasons: Secondary | ICD-10-CM | POA: Diagnosis present

## 2017-08-18 DIAGNOSIS — Z87440 Personal history of urinary (tract) infections: Secondary | ICD-10-CM

## 2017-08-18 DIAGNOSIS — N319 Neuromuscular dysfunction of bladder, unspecified: Secondary | ICD-10-CM | POA: Diagnosis present

## 2017-08-18 DIAGNOSIS — Z8673 Personal history of transient ischemic attack (TIA), and cerebral infarction without residual deficits: Secondary | ICD-10-CM

## 2017-08-18 DIAGNOSIS — E109 Type 1 diabetes mellitus without complications: Secondary | ICD-10-CM | POA: Diagnosis present

## 2017-08-18 DIAGNOSIS — E46 Unspecified protein-calorie malnutrition: Secondary | ICD-10-CM | POA: Diagnosis not present

## 2017-08-18 DIAGNOSIS — Z8042 Family history of malignant neoplasm of prostate: Secondary | ICD-10-CM

## 2017-08-18 DIAGNOSIS — F331 Major depressive disorder, recurrent, moderate: Secondary | ICD-10-CM | POA: Diagnosis not present

## 2017-08-18 DIAGNOSIS — R4182 Altered mental status, unspecified: Secondary | ICD-10-CM | POA: Diagnosis not present

## 2017-08-18 DIAGNOSIS — Z8249 Family history of ischemic heart disease and other diseases of the circulatory system: Secondary | ICD-10-CM

## 2017-08-18 LAB — COMPREHENSIVE METABOLIC PANEL
ALK PHOS: 537 U/L — AB (ref 38–126)
ALT: 6 U/L — ABNORMAL LOW (ref 14–54)
ANION GAP: 4 — AB (ref 5–15)
AST: 41 U/L (ref 15–41)
Albumin: 1.1 g/dL — ABNORMAL LOW (ref 3.5–5.0)
BUN: 24 mg/dL — ABNORMAL HIGH (ref 6–20)
CALCIUM: 8 mg/dL — AB (ref 8.9–10.3)
CO2: 24 mmol/L (ref 22–32)
Chloride: 116 mmol/L — ABNORMAL HIGH (ref 101–111)
Creatinine, Ser: 1.72 mg/dL — ABNORMAL HIGH (ref 0.44–1.00)
GFR calc non Af Amer: 34 mL/min — ABNORMAL LOW (ref >60)
GFR, EST AFRICAN AMERICAN: 39 mL/min — AB (ref >60)
Glucose, Bld: 62 mg/dL — ABNORMAL LOW (ref 65–99)
POTASSIUM: 3.3 mmol/L — AB (ref 3.5–5.1)
SODIUM: 144 mmol/L (ref 135–145)
Total Bilirubin: 0.3 mg/dL (ref 0.3–1.2)
Total Protein: 4.2 g/dL — ABNORMAL LOW (ref 6.5–8.1)

## 2017-08-18 LAB — URINALYSIS, ROUTINE W REFLEX MICROSCOPIC
BACTERIA UA: NONE SEEN
BILIRUBIN URINE: NEGATIVE
Glucose, UA: 150 mg/dL — AB
Ketones, ur: NEGATIVE mg/dL
NITRITE: NEGATIVE
PH: 5 (ref 5.0–8.0)
Protein, ur: 100 mg/dL — AB
RBC / HPF: >50 RBC/hpf — ABNORMAL HIGH (ref 0–5)
SPECIFIC GRAVITY, URINE: 1.009 (ref 1.005–1.030)
WBC, UA: >50 WBC/hpf — ABNORMAL HIGH (ref 0–5)

## 2017-08-18 LAB — GLUCOSE, CAPILLARY
GLUCOSE-CAPILLARY: 97 mg/dL (ref 65–99)
GLUCOSE-CAPILLARY: 98 mg/dL (ref 65–99)
Glucose-Capillary: 119 mg/dL — ABNORMAL HIGH (ref 65–99)

## 2017-08-18 LAB — CBG MONITORING, ED
GLUCOSE-CAPILLARY: 168 mg/dL — AB (ref 65–99)
Glucose-Capillary: 132 mg/dL — ABNORMAL HIGH (ref 65–99)
Glucose-Capillary: 53 mg/dL — ABNORMAL LOW (ref 65–99)
Glucose-Capillary: 138 mg/dL — ABNORMAL HIGH (ref 65–99)
Glucose-Capillary: 141 mg/dL — ABNORMAL HIGH (ref 65–99)

## 2017-08-18 LAB — CBC WITH DIFFERENTIAL/PLATELET
BASOS PCT: 0 %
Basophils Absolute: 0 10*3/uL (ref 0.0–0.1)
EOS ABS: 0.1 10*3/uL (ref 0.0–0.7)
Eosinophils Relative: 2 %
HCT: 25.1 % — ABNORMAL LOW (ref 36.0–46.0)
HEMOGLOBIN: 7.8 g/dL — AB (ref 12.0–15.0)
LYMPHS ABS: 1.7 10*3/uL (ref 0.7–4.0)
Lymphocytes Relative: 33 %
MCH: 27.5 pg (ref 26.0–34.0)
MCHC: 31.1 g/dL (ref 30.0–36.0)
MCV: 88.4 fL (ref 78.0–100.0)
MONO ABS: 0.2 10*3/uL (ref 0.1–1.0)
Monocytes Relative: 4 %
NEUTROS ABS: 3.1 10*3/uL (ref 1.7–7.7)
Neutrophils Relative %: 61 %
PLATELETS: 65 10*3/uL — AB (ref 150–400)
RBC: 2.84 MIL/uL — ABNORMAL LOW (ref 3.87–5.11)
RDW: 20.3 % — AB (ref 11.5–15.5)
WBC: 5.1 10*3/uL (ref 4.0–10.5)

## 2017-08-18 LAB — I-STAT TROPONIN, ED: TROPONIN I, POC: 0.01 ng/mL (ref 0.00–0.08)

## 2017-08-18 LAB — I-STAT CHEM 8, ED
BUN: 25 mg/dL — AB (ref 6–20)
CREATININE: 1.7 mg/dL — AB (ref 0.44–1.00)
Calcium, Ion: 1.26 mmol/L (ref 1.15–1.40)
Chloride: 111 mmol/L (ref 101–111)
GLUCOSE: 246 mg/dL — AB (ref 65–99)
HCT: 23 % — ABNORMAL LOW (ref 36.0–46.0)
Hemoglobin: 7.8 g/dL — ABNORMAL LOW (ref 12.0–15.0)
Potassium: 3.1 mmol/L — ABNORMAL LOW (ref 3.5–5.1)
Sodium: 145 mmol/L (ref 135–145)
TCO2: 22 mmol/L (ref 22–32)

## 2017-08-18 LAB — I-STAT CG4 LACTIC ACID, ED
Lactic Acid, Venous: 1 mmol/L (ref 0.5–1.9)
Lactic Acid, Venous: 0.6 mmol/L (ref 0.5–1.9)

## 2017-08-18 LAB — GAMMA GT: GGT: 131 U/L — ABNORMAL HIGH (ref 7–50)

## 2017-08-18 LAB — MRSA PCR SCREENING: MRSA by PCR: NEGATIVE

## 2017-08-18 MED ORDER — POTASSIUM CHLORIDE 10 MEQ/100ML IV SOLN
10.0000 meq | Freq: Once | INTRAVENOUS | Status: AC
  Administered 2017-08-18: 10 meq via INTRAVENOUS
  Filled 2017-08-18: qty 100

## 2017-08-18 MED ORDER — FAMOTIDINE IN NACL 20-0.9 MG/50ML-% IV SOLN
20.0000 mg | Freq: Two times a day (BID) | INTRAVENOUS | Status: DC
  Administered 2017-08-18 – 2017-08-20 (×5): 20 mg via INTRAVENOUS
  Filled 2017-08-18 (×5): qty 50

## 2017-08-18 MED ORDER — LINEZOLID 600 MG/300ML IV SOLN
600.0000 mg | Freq: Once | INTRAVENOUS | Status: AC
  Administered 2017-08-18: 600 mg via INTRAVENOUS
  Filled 2017-08-18: qty 300

## 2017-08-18 MED ORDER — SODIUM CHLORIDE 0.9 % IV BOLUS
500.0000 mL | Freq: Once | INTRAVENOUS | Status: AC
  Administered 2017-08-18: 500 mL via INTRAVENOUS

## 2017-08-18 MED ORDER — LEVOFLOXACIN IN D5W 750 MG/150ML IV SOLN
750.0000 mg | Freq: Once | INTRAVENOUS | Status: DC
  Administered 2017-08-18: 750 mg via INTRAVENOUS
  Filled 2017-08-18: qty 150

## 2017-08-18 MED ORDER — SODIUM CHLORIDE 0.9 % IV SOLN
1.0000 g | Freq: Two times a day (BID) | INTRAVENOUS | Status: DC
  Administered 2017-08-18 – 2017-08-22 (×9): 1 g via INTRAVENOUS
  Filled 2017-08-18 (×10): qty 1

## 2017-08-18 MED ORDER — SODIUM CHLORIDE 0.9 % IV SOLN
INTRAVENOUS | Status: DC
  Administered 2017-08-18 – 2017-08-21 (×6): via INTRAVENOUS

## 2017-08-18 MED ORDER — SODIUM CHLORIDE 0.9% FLUSH
3.0000 mL | Freq: Two times a day (BID) | INTRAVENOUS | Status: DC
  Administered 2017-08-18 – 2017-08-27 (×10): 3 mL via INTRAVENOUS

## 2017-08-18 MED ORDER — SODIUM CHLORIDE 0.9 % IV BOLUS
1000.0000 mL | Freq: Once | INTRAVENOUS | Status: AC
  Administered 2017-08-18: 1000 mL via INTRAVENOUS

## 2017-08-18 MED ORDER — TERIFLUNOMIDE 14 MG PO TABS: 14.0000 mg | ORAL_TABLET | Freq: Every day | ORAL | Status: DC

## 2017-08-18 MED ORDER — NOREPINEPHRINE 4 MG/250ML-% IV SOLN
0.0000 ug/min | INTRAVENOUS | Status: DC
  Administered 2017-08-18 (×2): 2 ug/min via INTRAVENOUS
  Filled 2017-08-18 (×2): qty 250

## 2017-08-18 MED ORDER — LINEZOLID 600 MG/300ML IV SOLN
600.0000 mg | Freq: Two times a day (BID) | INTRAVENOUS | Status: DC
  Administered 2017-08-18 – 2017-08-22 (×8): 600 mg via INTRAVENOUS
  Filled 2017-08-18 (×8): qty 300

## 2017-08-18 MED ORDER — ACETAMINOPHEN 650 MG RE SUPP: 650.0000 mg | Freq: Four times a day (QID) | RECTAL | Status: DC | PRN

## 2017-08-18 MED ORDER — ACETAMINOPHEN 325 MG PO TABS
650.0000 mg | ORAL_TABLET | Freq: Four times a day (QID) | ORAL | Status: DC | PRN
  Filled 2017-08-18: qty 2

## 2017-08-18 MED ORDER — BACLOFEN 5 MG PO TABS
5.0000 mg | ORAL_TABLET | Freq: Three times a day (TID) | ORAL | Status: DC
  Filled 2017-08-18 (×2): qty 1

## 2017-08-18 MED ORDER — HEPARIN SODIUM (PORCINE) 5000 UNIT/ML IJ SOLN
5000.0000 [IU] | Freq: Three times a day (TID) | INTRAMUSCULAR | Status: DC
  Administered 2017-08-18 – 2017-08-20 (×6): 5000 [IU] via SUBCUTANEOUS
  Filled 2017-08-18 (×7): qty 1

## 2017-08-18 MED ORDER — ONDANSETRON HCL 4 MG PO TABS
4.0000 mg | ORAL_TABLET | Freq: Four times a day (QID) | ORAL | Status: DC | PRN
  Administered 2017-08-29: 4 mg via ORAL
  Filled 2017-08-18: qty 1

## 2017-08-18 MED ORDER — BACLOFEN 10 MG PO TABS
5.0000 mg | ORAL_TABLET | Freq: Three times a day (TID) | ORAL | Status: DC
  Administered 2017-08-18 – 2017-09-05 (×26): 5 mg via ORAL
  Filled 2017-08-18 (×42): qty 1

## 2017-08-18 MED ORDER — DEXTROSE 50 % IV SOLN
INTRAVENOUS | Status: AC
  Administered 2017-08-18: 50 mL
  Filled 2017-08-18: qty 50

## 2017-08-18 MED ORDER — VANCOMYCIN HCL IN DEXTROSE 1-5 GM/200ML-% IV SOLN
1000.0000 mg | Freq: Once | INTRAVENOUS | Status: DC
  Filled 2017-08-18: qty 200

## 2017-08-18 MED ORDER — ONDANSETRON HCL 4 MG/2ML IJ SOLN
4.0000 mg | Freq: Four times a day (QID) | INTRAMUSCULAR | Status: DC | PRN
  Administered 2017-08-23 – 2017-08-28 (×4): 4 mg via INTRAVENOUS
  Filled 2017-08-18 (×4): qty 2

## 2017-08-18 MED ORDER — SODIUM CHLORIDE 0.9 % IV SOLN
2.0000 g | Freq: Once | INTRAVENOUS | Status: AC
  Administered 2017-08-18: 2 g via INTRAVENOUS
  Filled 2017-08-18: qty 2

## 2017-08-18 NOTE — Progress Notes (Signed)
Pharmacy Antibiotic Note  Debra [name] is a 52 y.o. female admitted on 08/18/2017 with AMS, hypotension, and hypoglycemia.  Multiple wounds noted including unstageable sacral, left heel and right shin. Pharmacy has been consulted for vancomycin, aztreonam, and levofloxacin dosing for sepsis. Spoke with Dr Rosalia Hammers in the ED about history and ok to change to linezolid and meropenem. Also spoke with Dr Luciana Axe from ID and ok to continue linezolid.   PMH: MS, T1DM, depression, dysphagia/poor PO intake, recurrent UTIs (ESBL/VRE), sacral osteo (completed meropenem 6/16 then to start doxycycline until ID f/u).   AKI noted, SCr 1.7 (?baseline 1.1-1.2), norm CrCl ~45 ml/min. Hypothermia noted, LA 1.   Plan: Discontinue vancomycin, aztreonam, and levofloxacin Linezolid 600 mg IV q12h Meropenem 1 g IV q12h Monitor renal function and adjust dosing as needed F/U clinical progress, C/S, and de-escalation as able     Temp (24hrs), Avg:90.5 F (32.5 C), Min:90.5 F (32.5 C), Max:90.5 F (32.5 C)  Recent Labs  Lab 08/18/17 0717  WBC 5.1    CrCl cannot be calculated (Patient's most recent lab result is older than the maximum 21 days allowed.).    Allergies  Allergen Reactions  . Penicillins Anaphylaxis, Nausea And Vomiting and Rash    Has patient had a PCN reaction causing immediate rash, facial/tongue/throat swelling, SOB or lightheadedness with hypotension: Yes Has patient had a PCN reaction causing severe rash involving mucus membranes or skin necrosis: Yes Has patient had a PCN reaction that required hospitalization No Has patient had a PCN reaction occurring within the last 10 years: Yes If all of the above answers are "NO", then may proceed with Cephalosporin use.   . Lactose Intolerance (Gi) Other (See Comments)  . Pollen Extract Other (See Comments)    Seasonal Allergies  . Tape Rash   Previous Antimicrobials: Linezolid 5/20 >>5/22 Merrem 5/20 >> 6/16 Flucon 5/21>>5/28   Doxycycline 6/17 >>  Antimicrobials this admission: aztreonam 6/22 x1 levofloxacin 6/22 x1 linezolid 6/22 >> Meropenem 6/22 >>  Dose adjustments this admission:   Microbiology results: 6/22 BCx:  6/22 UCx:     Thank you for allowing pharmacy to be a part of this patient's care.  Loura Back, PharmD, BCPS Clinical Pharmacist Clinical phone for 08/18/2017 until 3p is [phone] Please check AMION for all [facility] Pharmacy numbers 08/18/2017 8:25 AM

## 2017-08-18 NOTE — ED Provider Notes (Signed)
[facility] EMERGENCY DEPARTMENT Provider Note   CSN: [id number] Arrival date & time: 08/18/17  4098     History   Chief Complaint Chief Complaint  Patient presents with  . Hypoglycemia  . Hypotension    HPI Debra [name] is a 52 y.o. female.  HPI  Level 36 caveat 52 year old female from nursing home nonverbal presents with reports of low blood sugar and low blood pressure.  Patient recently discharged on May 28 after admission for sepsis, candidal UTI, sacral osteomyelitis, question of MRSA bacteremia, with history of multiple sclerosis, stroke, and type 1 diabetes.  Patient is reportedly verbal at baseline.  There is little information obtained from the facility.  EMS reports that they were called out due to hypoglycemia and hypotension.  They report initial blood pressure 65/49 with CBG of 39.  They report her sats went as low as 84 4% and she was started on a cannula at 3 L with increase to 100%.  Review of palliative care consult from June 5 reveals no definitive goals of care decided on during that encounter.  Past Medical History:  Diagnosis Date  . Adenomatous colonic polyps   . Anemia    2005  . Anxiety    1990  . Arthritis    "knees, hands" (05/07/2017)  . Asthma    2000  . Benign essential HTN   . Cataract   . Chronic lower back pain   . Decubital ulcer   . Depression with psychotic symptoms   . Diabetic ketoacidosis without coma associated with type 1 diabetes mellitus (HCC)   . Difficult intubation    narrow airway  . Fibromyalgia 11/07/2016  . Gastroesophageal Reflux Disease (GERD)   . Heart murmur    Birth  . Hyperlipidemia    2005  . Hypertension    1998  . Internal hemorrhoids 04/24/06   on colonoscopy  . Migraine    "used to have them all the time; now maybe q6 months" (05/07/2017)  . Multiple sclerosis (HCC)   . Neuropathic bladder   . Neuropathy of the hands & feet   . Restless Leg Syndrome   . Sepsis due to urinary  tract infection (HCC) 04/12/2017  . Sleep paralysis   . Stable angina pectoris    2007: cath showing normal cors.   . Stroke 1990   denies residual on 05/07/2017  . Type I Diabetes Mellitus 1988    Patient Active Problem List   Diagnosis Date Noted  . Sacral osteomyelitis (HCC)   . Sacral decubitus ulcer, stage IV (HCC)   . Malnutrition of moderate degree 07/17/2017  . Sepsis (HCC) 07/16/2017  . Adjustment disorder with mixed anxiety and depressed mood   . MS (multiple sclerosis) (HCC)   . Palliative care by specialist   . Advance care planning   . Major depressive disorder, recurrent episode, moderate (HCC) 07/02/2017  . Staphylococcus epidermidis sepsis (HCC)   . PICC line infection, subsequent encounter   . Severe sepsis (HCC) 06/26/2017  . Acute metabolic encephalopathy   . Acute respiratory failure (HCC)   . Acute pyelonephritis   . Normocytic anemia 05/25/2017  . Decubitus ulcer of coccygeal region, stage IV (HCC) 05/21/2017  . 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 Proteus infection 05/06/2017  . Foley catheter in place on admission 05/06/2017  . Hyperglycemia 05/05/2017  . Bacteriuria 05/05/2017  . Recurrent UTI   . Palliative care encounter   .  Goals of care, counseling/discussion   . Pressure injury of skin 04/14/2017  . Aortic atherosclerosis (HCC) 03/14/2017  . Major depressive disorder, single episode, severe without psychosis (HCC) 01/02/2017  . Dysthymia 12/27/2016  . HTN (hypertension)   . Neurogenic bladder   . Labile blood pressure   . Leukocytosis   . Labile blood glucose   . Sleep disturbance   . Multiple sclerosis (HCC) 11/22/2016  . Thoracic root lesion 11/22/2016  . Neuropathic pain   . Leg weakness, bilateral 11/20/2016  . History of CVA (cerebrovascular accident)   . Uncontrolled type 1 diabetes mellitus with diabetic peripheral neuropathy (HCC)   . Diabetic acidosis  (HCC) 11/15/2016  . Back pain 10/31/2016  . AKI (acute kidney injury) (HCC)   . Stable angina pectoris (HCC)   . Vitamin D deficiency 08/31/2014  . 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 Hyperlipidemia 10/06/2013  . Chronic diarrhea 10/19/2010  . Peripheral neuropathy 08/19/2010  . Type 1 diabetes mellitus (HCC) 07/06/2010  . Hypertension 07/06/2010  . Asthma 07/06/2010    Past Surgical History:  Procedure Laterality Date  . BREAST SURGERY Left    biopsy left breast  . CARDIAC CATHETERIZATION  ~ 2007/2008  . COLONOSCOPY W/ BIOPSIES AND POLYPECTOMY    . DEBRIDMENT OF DECUBITUS ULCER N/A 05/21/2017   Procedure: DEBRIDMENT OF SACRAL DECUBITUS ULCER;  Surgeon: Claud Kelp, MD;  Location: [facility] OR;  Service: General;  Laterality: N/A;  . DILATION AND CURETTAGE OF UTERUS  12/29/2010   Surgeon: Tereso Newcomer, MD;  Location: [facility] ORS;  Service: Gynecology  . ENDOMETRIAL ABLATION W/ NOVASURE N/A 12/2009  . EYE SURGERY Bilateral    "lots; laser tto correct broken blood vessels from diabetes"  . FOOT SURGERY Bilateral    "bones fused; bones removed"  . FRACTURE SURGERY    . HYSTEROSCOPY  12/29/2010   Procedure: HYSTEROSCOPY WITH HYDROTHERMAL ABLATION;  Surgeon: Tereso Newcomer, MD;  Location: [facility] ORS;  Service: Gynecology;;  . IUD REMOVAL  12/29/2010   Procedure: INTRAUTERINE DEVICE (IUD) REMOVAL;  Surgeon: Tereso Newcomer, MD;  Location: [facility] ORS;  Service: Gynecology;  Laterality: N/A;  . LAPAROSCOPIC CHOLECYSTECTOMY    . LAPAROSCOPIC TUBAL LIGATION  12/29/2010   Procedure: LAPAROSCOPIC TUBAL LIGATION;  Surgeon: Tereso Newcomer, MD;  Location: [facility] ORS;  Service: Gynecology;  Laterality: Bilateral;  . ORIF ANKLE FRACTURE Right 10/09/2013   Procedure: Open reduction internal fixation right ankle;  Surgeon: Mable Paris, MD;  Location: [facility] OR;  Service: Orthopedics;  Laterality: Right;  Open reduction internal fixation right ankle  . TRIGGER FINGER RELEASE      x 3  . TUBAL LIGATION       OB History    Gravida  2   Para  2   Term  1   Preterm  1   AB  0   Living  2     SAB  0   TAB  0   Ectopic  0   Multiple  0   Live Births  2            Home Medications    Prior to Admission medications   Medication Sig Start Date End Date Taking? Authorizing Provider  acetaminophen (TYLENOL) 325 MG tablet Take 650 mg by mouth every 4 (four) hours as needed for mild pain or fever.     [provider]  Amino Acids-Protein Hydrolys (FEEDING SUPPLEMENT, PRO-STAT SUGAR FREE 64,) LIQD Take  30 mLs by mouth 3 (three) times daily between meals. 07/05/17   Reymundo Poll, MD  amLODipine (NORVASC) 10 MG tablet Take 1 tablet (10 mg total) by mouth daily. 02/14/17 02/14/18  Angelita Ingles, MD  aspirin EC 81 MG tablet Take 1 tablet (81 mg total) by mouth daily. Patient not taking: Reported on 07/16/2017 02/14/17   Angelita Ingles, MD  atorvastatin (LIPITOR) 40 MG tablet Take 1 tablet (40 mg total) by mouth daily. Patient taking differently: Take 40 mg by mouth every evening.  02/14/17   Angelita Ingles, MD  Baclofen 5 MG TABS Take 5 mg by mouth 3 (three) times daily. 04/24/17   Narda Bonds, MD  bethanechol (URECHOLINE) 50 MG tablet Take 1 tablet (50 mg total) by mouth 3 (three) times daily. 02/14/17   Angelita Ingles, MD  collagenase (SANTYL) ointment Apply topically 2 (two) times daily. Patient not taking: Reported on 06/26/2017 05/25/17   Darreld Mclean, MD  doxycycline (VIBRAMYCIN) 50 MG capsule Take 2 capsules (100 mg total) by mouth 2 (two) times daily. Starting 6/17 08/13/17 11/11/17  Scherrie Gerlach, MD  DULoxetine (CYMBALTA) 30 MG capsule Take 1 capsule (30 mg total) by mouth daily. 07/05/17   Reymundo Poll, MD  feeding supplement, GLUCERNA SHAKE, (GLUCERNA SHAKE) LIQD Take 237 mLs by mouth 2 (two) times daily between meals. 02/14/17   Angelita Ingles, MD  glucagon 1 MG injection Inject 1 mg into the vein once  as needed (for Hypoglycernia).    [provider]  insulin aspart (NOVOLOG) 100 UNIT/ML injection Inject 4-14 Units into the skin 3 (three) times daily before meals. 150-200=4 units 201-250=6 units 241-300=8 units 301-350=10 units 351-400=12 units 401-450=14 units notify PCP if >451    [provider]  insulin glargine (LANTUS) 100 UNIT/ML injection Inject 0.05 mLs (5 Units total) into the skin at bedtime. 07/05/17   Reymundo Poll, MD  Lactobacillus (ACIDOPHILUS) 100 MG CAPS Take 100 mg by mouth 2 (two) times daily.    [provider]  lidocaine (LIDODERM) 5 % Place 1 patch onto the skin daily. FOR LOWER BACK PAIN Remove & Discard patch within 12 hours or as directed by MD    [provider]  lisinopril (PRINIVIL,ZESTRIL) 20 MG tablet Take 1 tablet (20 mg total) by mouth daily. 02/15/17   Angelita Ingles, MD  loperamide (IMODIUM) 2 MG capsule Take 2 capsules (4 mg total) by mouth as needed for diarrhea or loose stools. 04/24/17   Narda Bonds, MD  magnesium hydroxide (MILK OF MAGNESIA) 400 MG/5ML suspension Take 30 mLs by mouth daily as needed for mild constipation. 02/14/17   Angelita Ingles, MD  meclizine (ANTIVERT) 12.5 MG tablet Take 12.5 mg by mouth 3 (three) times daily as needed for dizziness. per nsg. home MAR, pt. takes 1 tablet 1 hour prior to therapy.    [provider]  metoprolol succinate (TOPROL-XL) 100 MG 24 hr tablet Take 1 tablet (100 mg total) by mouth daily. Take with or immediately following a meal. 02/15/17   Angelita Ingles, MD  mirtazapine (REMERON) 45 MG tablet Take 1 tablet (45 mg total) by mouth at bedtime. 04/24/17   Narda Bonds, MD  modafinil (PROVIGIL) 100 MG tablet Take 1 tablet (100 mg total) by mouth every morning. Patient taking differently: Take 100 mg by mouth daily.  07/05/17   Reymundo Poll, MD  Multiple Vitamin (MULTIVITAMIN) tablet Take 1 tablet by mouth daily.    [provider]    ondansetron (ZOFRAN) 4 MG tablet Take 4 mg by mouth daily.    [provider]  pantoprazole (PROTONIX) 40 MG tablet Take 1 tablet (40 mg total) by mouth daily. 02/15/17   Angelita Ingles, MD  polyethylene glycol (MIRALAX / Ethelene Hal) packet Take 17 g by mouth daily as needed for mild constipation, moderate constipation or severe constipation. 02/14/17   Angelita Ingles, MD  pregabalin (LYRICA) 25 MG capsule Take 25 mg by mouth every 8 (eight) hours.     [provider]  tamsulosin (FLOMAX) 0.4 MG CAPS capsule Take 1 capsule (0.4 mg total) daily by mouth. Patient taking differently: Take 0.4 mg by mouth daily after supper.  01/03/17   Gouru, Deanna Artis, MD  Teriflunomide 14 MG TABS Take 14 mg by mouth daily.    [provider]  Vitamin D, Ergocalciferol, (DRISDOL) 50000 units CAPS capsule Take 1 capsule (50,000 Units total) by mouth every 7 (seven) days. Patient taking differently: Take 50,000 Units by mouth every 7 (seven) days. On Friday 02/15/17   Angelita Ingles, MD    Family History Family History  Problem Relation Age of Onset  . Hypertension Mother   . Kidney disease Mother   . Hypertension Father   . Breast cancer Maternal Grandmother   . Prostate cancer Maternal Grandfather   . Ovarian cancer Paternal Grandmother   . Prostate cancer Paternal Grandfather   . Colon cancer Maternal Uncle        Family history of malignant neoplasm of gastrointestinal tract    Social History Social History   Tobacco Use  . Smoking status: Never Smoker  . Smokeless tobacco: Never Used  Substance Use Topics  . Alcohol use: No    Alcohol/week: 0.0 oz  . Drug use: No    Comment: drug addict     Allergies   Penicillins; Lactose intolerance (gi); Pollen extract; and Tape  Allergies as of 07/24/2017       Reactions    Penicillins Anaphylaxis, Nausea And Vomiting, Rash    Has patient had a PCN reaction causing immediate rash, facial/tongue/throat swelling, SOB or  lightheadedness with hypotension: Yes Has patient had a PCN reaction causing severe rash involving mucus membranes or skin necrosis: Yes Has patient had a PCN reaction that required hospitalization No Has patient had a PCN reaction occurring within the last 10 years: Yes If all of the above answers are "NO", then may proceed with Cephalosporin use.    Review of Systems Review of Systems  Unable to perform ROS: Mental status change     Physical Exam Updated Vital Signs BP 128/81   Pulse (!) 56   Resp (!) 9   SpO2 100%   Physical Exam  Constitutional:  Chronically ill-appearing female laying on the bed with out any verbalizing.  She is contractured and has pressure boots and dressings in place along with Foley catheter in right upper arm midline catheter  HENT:  Head: Atraumatic.  Right Ear: External ear normal.  Left Ear: External ear normal.  Mouth/Throat: Oropharynx is clear and moist.  Eyes: Pupils are equal, round, and reactive to light.  Stools are small 2 to 3 mm  Neck: No tracheal deviation present. No thyromegaly present.  Cardiovascular: Bradycardia present.  Pulmonary/Chest: Effort normal.  Some rhonchi but appears to have good air movement no wheezes or rales are noted  Abdominal: Soft. There is no tenderness.  Genitourinary:  Genitourinary Comments: Patient with large sacral wound  with packing in place Foley catheter in place  Musculoskeletal:  All extremities contractured with pressure wound right heel Catheter right upper arm with some swelling of right hand but no erythema or signs of infection  Neurological:  Patient with eyes open Shakes head no to some questions but does not respond to others  Skin: Capillary refill takes less than 2 seconds.  Skin is cool Pressure ulcers as noted above No other rashes are noted  Psychiatric:  flat  Nursing note and vitals reviewed.    ED Treatments / Results  Labs (all labs ordered are listed, but only  abnormal results are displayed) Labs Reviewed  CBG MONITORING, ED - Abnormal; Notable for the following components:      Result Value   Glucose-Capillary 53 (*)    All other components within normal limits  CULTURE, BLOOD (ROUTINE X 2)  CULTURE, BLOOD (ROUTINE X 2)  CBC WITH DIFFERENTIAL/PLATELET  COMPREHENSIVE METABOLIC PANEL  URINALYSIS, ROUTINE W REFLEX MICROSCOPIC  I-STAT CG4 LACTIC ACID, ED  I-STAT CHEM 8, ED  I-STAT TROPONIN, ED    EKG EKG Interpretation  Date/Time:  Saturday August 18 2017 07:24:24 EDT Ventricular Rate:  152 PR Interval:    QRS Duration: 100 QT Interval:  299 QTC Calculation: 458 R Axis:   79 Text Interpretation:  Atrial fibrillation with slow ventricular response Confirmed by Margarita Grizzle [phone]) on 08/18/2017 8:08:09 AM   Radiology Dg Chest Port 1 View  Result Date: 08/18/2017 CLINICAL DATA:  Weakness and hypotension. EXAM: PORTABLE CHEST 1 VIEW COMPARISON:  Chest x-Breylen Agyeman dated Jul 19, 2017. FINDINGS: The patient is rotated to the left. Unchanged right upper extremity PICC line with the tip in the SVC. Interval removal of the right internal jugular central venous catheter. Stable cardiomegaly. Increased retrocardiac density. The right lung is clear. No pleural effusion or pneumothorax. No acute osseous abnormality. IMPRESSION: 1. New left lower lobe consolidation, concerning for pneumonia. Electronically Signed   By: Obie Dredge M.D.   On: 08/18/2017 08:25   CXR reviewed Margarita Grizzle, MD Procedures .Critical Care Performed by: Margarita Grizzle, MD Authorized by: Margarita Grizzle, MD   Critical care provider statement:    Critical care time (minutes):  60   Critical care start time:  08/18/2017 7:13 AM   Critical care end time:  08/18/2017 10:35 AM   Critical care time was exclusive of:  Separately billable procedures and treating other patients and teaching time   Critical care was necessary to treat or prevent imminent or life-threatening deterioration  of the following conditions:  CNS failure or compromise, circulatory failure, sepsis and metabolic crisis   Critical care was time spent personally by me on the following activities:  Development of treatment plan with patient or surrogate, discussions with consultants, evaluation of patient's response to treatment, examination of patient, ordering and performing treatments and interventions, ordering and review of laboratory studies, ordering and review of radiographic studies, pulse oximetry, re-evaluation of patient's condition and review of old charts   (including critical care time)  Medications Ordered in ED Medications  sodium chloride 0.9 % bolus 1,000 mL (has no administration in time range)  dextrose 50 % solution (50 mLs  Given 08/18/17 0720)     Initial Impression / Assessment and Plan / ED Course  I have reviewed the triage vital signs and the nursing notes.  Pertinent labs & imaging results that were available during my care of the patient were reviewed by me and considered in my medical decision  making (see chart for details).  Clinical Course as of Aug 18 1012  Sat Aug 18, 2017  0803 Reviewed ekg   EKG 12-Lead [DR]  1013 Reviewed cxr   [DR]    Clinical Course User Index [DR] Margarita Grizzle, MD   Patient with ams with resolved hypotension and hypoglycemia, but noted here to be significantly hypothermic with rectal temp 90. Bair hugger applied Blood cultures, abx ordered due to concern of infection with ams and hypothermia.  Patient also with multiple possible sources and recent hospitalization with infections. Patient with reported severe allergy to pcn  9:04 AM Discussed with pharmacist.  Patient with history of VRE UTI.  Plan change antibiotic regimen to linezolid and meropenem.  Patient has already received Levaquin here in ED.  Will attempt to discontinue the aztreonam and vancomycin as it does not appear she has received these yet.  Pharmacist will place  orders.  10:05 AM Patient with bp 95/74 HR continues a fib with rate in50s Plan additional iv fluids with warmer Temp foley being placed DDX/MDM Altered mental status improving here Hypoglycemia- improved/normal after d50 Hypothermia- on bair hugger being given warmed fluids ? Infection as etiology- blood cultures, iv abx, and iv fluids given-source bacteremia vs wounds vs urine vs pneumonia cxr with new ifiltrate New onset a fib-? Secondary to hypothermia- will require ongoing monitorin Mild hypokalmeia  Discussed with Dr.Molt and she will be down to evaluate  Final Clinical Impressions(s) / ED Diagnoses   Final diagnoses:  Sepsis, due to unspecified organism [facility])  Healthcare-associated pneumonia  Hypoglycemia  Hypothermia, initial encounter    ED Discharge Orders    None       Margarita Grizzle, MD 08/18/17 1038

## 2017-08-18 NOTE — ED Notes (Signed)
Spoke with Dr. Evelene Croon from internal medicine and informed of Bp readings in the 80's;  Dr. Evelene Croon states she will consult Critical Care "

## 2017-08-18 NOTE — Consult Note (Signed)
PULMONARY / CRITICAL CARE MEDICINE   Name: Debra [name] MRN: [id number] DOB: 1965/12/04    ADMISSION DATE:  08/18/2017 CONSULTATION DATE:  08/18/17  REFERRING MD:  [facility] , Dr. Evelene Croon    CHIEF COMPLAINT:  Hypotension   HISTORY OF PRESENT ILLNESS:   52 yo female NH patient with MS , neurogenic bladder with chronic foley , bowel incontinence , chronic sacral ulcers and osteomyelitis , chronic UTI with hx of VRE/ESBL Kelbsiella , IDDM . PMH significant for multiple admissions in past with Sepsis with DKA . Admitted 6/22 with hypothermia ,hypoglycemia ,  lethargic and hypotensive. CXR shows LLL consolidation .  She was started on sepsis protocol . Received 4L NS and MAP b/p remains low. PCCM consulted for hypotension .   PAST MEDICAL HISTORY :  She  has a past medical history of Adenomatous colonic polyps, Anemia, Anxiety, Arthritis, Asthma, Benign essential HTN, Cataract, Chronic lower back pain, Decubital ulcer, Depression with psychotic symptoms, Diabetic ketoacidosis without coma associated with type 1 diabetes mellitus (HCC), Difficult intubation, Fibromyalgia (11/07/2016), Gastroesophageal Reflux Disease (GERD), Heart murmur, Hyperlipidemia, Hypertension, Internal hemorrhoids (04/24/06), Migraine, Multiple sclerosis (HCC), Neuropathic bladder, Neuropathy of the hands & feet, Restless Leg Syndrome, Sepsis due to urinary tract infection (HCC) (04/12/2017), Sleep paralysis, Stable angina pectoris, Stroke (1990), and Type I Diabetes Mellitus (1988).  PAST SURGICAL HISTORY: She  has a past surgical history that includes Trigger finger release; Laparoscopic tubal ligation (12/29/2010); IUD removal (12/29/2010); Hysteroscopy (12/29/2010); Dilation and curettage of uterus (12/29/2010); Endometrial ablation w/ novasure (N/A, 12/2009); Breast surgery (Left); Colonoscopy w/ biopsies and polypectomy; ORIF ankle fracture (Right, 10/09/2013); Foot surgery (Bilateral); Tubal ligation; Laparoscopic cholecystectomy;  Fracture surgery; Eye surgery (Bilateral); Cardiac catheterization (~ 2007/2008); and Debridment of decubitus ulcer (N/A, 05/21/2017).  Allergies  Allergen Reactions  . Penicillins Anaphylaxis, Nausea And Vomiting and Rash    Has patient had a PCN reaction causing immediate rash, facial/tongue/throat swelling, SOB or lightheadedness with hypotension: Yes Has patient had a PCN reaction causing severe rash involving mucus membranes or skin necrosis: Yes Has patient had a PCN reaction that required hospitalization No Has patient had a PCN reaction occurring within the last 10 years: Yes If all of the above answers are "NO", then may proceed with Cephalosporin use.   . Lactose Intolerance (Gi) Other (See Comments)  . Pollen Extract Other (See Comments)    Seasonal Allergies  . Tape Rash    No current facility-administered medications on file prior to encounter.    Current Outpatient Medications on File Prior to Encounter  Medication Sig  . Amino Acids-Protein Hydrolys (FEEDING SUPPLEMENT, PRO-STAT SUGAR FREE 64,) LIQD Take 30 mLs by mouth 3 (three) times daily between meals.  Marland Kitchen amLODipine (NORVASC) 10 MG tablet Take 1 tablet (10 mg total) by mouth daily.  Marland Kitchen aspirin EC 81 MG tablet Take 1 tablet (81 mg total) by mouth daily.  Marland Kitchen atorvastatin (LIPITOR) 40 MG tablet Take 1 tablet (40 mg total) by mouth daily. (Patient taking differently: Take 40 mg by mouth at bedtime. )  . Baclofen 5 MG TABS Take 5 mg by mouth 3 (three) times daily.  . bethanechol (URECHOLINE) 50 MG tablet Take 1 tablet (50 mg total) by mouth 3 (three) times daily.  Marland Kitchen doxycycline (VIBRAMYCIN) 50 MG capsule Take 2 capsules (100 mg total) by mouth 2 (two) times daily. Starting 6/17 (Patient taking differently: Take 100 mg by mouth 2 (two) times daily. Starting 6/18 for about 4 weeks (end 7/18))  . DULoxetine (  CYMBALTA) 30 MG capsule Take 1 capsule (30 mg total) by mouth daily.  . ferrous sulfate 325 (65 FE) MG tablet Take 325 mg  by mouth daily with breakfast.  . Heparin Lock Flush (HEPARIN FLUSH, PORCINE,) 100 UNIT/ML injection 500 Units by Intracatheter route every 8 (eight) hours. Use for IV ABT using SASH method  . insulin glargine (LANTUS) 100 UNIT/ML injection Inject 0.05 mLs (5 Units total) into the skin at bedtime.  . Lactobacillus (ACIDOPHILUS) 100 MG CAPS Take 100 mg by mouth 2 (two) times daily.  Marland Kitchen lidocaine (LIDODERM) 5 % Place 1 patch onto the skin daily. FOR LOWER BACK PAIN Remove & Discard patch within 12 hours or as directed by MD  . lisinopril (PRINIVIL,ZESTRIL) 20 MG tablet Take 1 tablet (20 mg total) by mouth daily.  . metoprolol succinate (TOPROL-XL) 100 MG 24 hr tablet Take 1 tablet (100 mg total) by mouth daily. Take with or immediately following a meal.  . mirtazapine (REMERON) 45 MG tablet Take 1 tablet (45 mg total) by mouth at bedtime.  . modafinil (PROVIGIL) 100 MG tablet Take 1 tablet (100 mg total) by mouth every morning. (Patient taking differently: Take 100 mg by mouth daily. )  . Multiple Vitamin (MULTIVITAMIN) tablet Take 1 tablet by mouth daily.  . Nutritional Supplements (ENSURE PO) Take 237 mLs by mouth 3 (three) times daily.  . ondansetron (ZOFRAN) 4 MG tablet Take 4 mg by mouth daily.  . pantoprazole (PROTONIX) 40 MG tablet Take 1 tablet (40 mg total) by mouth daily.  . tamsulosin (FLOMAX) 0.4 MG CAPS capsule Take 1 capsule (0.4 mg total) daily by mouth. (Patient taking differently: Take 0.4 mg by mouth daily after supper. )  . Teriflunomide 14 MG TABS Take 14 mg by mouth daily.  . Vitamin D, Ergocalciferol, (DRISDOL) 50000 units CAPS capsule Take 1 capsule (50,000 Units total) by mouth every 7 (seven) days. (Patient taking differently: Take 50,000 Units by mouth every 7 (seven) days. On Friday)  . acetaminophen (TYLENOL) 325 MG tablet Take 650 mg by mouth every 6 (six) hours as needed for fever.  . collagenase (SANTYL) ointment Apply topically 2 (two) times daily. (Patient not taking:  Reported on 06/26/2017)  . feeding supplement, GLUCERNA SHAKE, (GLUCERNA SHAKE) LIQD Take 237 mLs by mouth 2 (two) times daily between meals. (Patient not taking: Reported on 08/18/2017)  . glucagon (GLUCAGON EMERGENCY) 1 MG injection Inject 1 mg into the muscle once as needed (Hypoglycemia).  Marland Kitchen HYDROmorphone (DILAUDID) 2 MG tablet Take 1 mg by mouth every 8 (eight) hours as needed for severe pain.  Marland Kitchen loperamide (IMODIUM) 2 MG capsule Take 2 capsules (4 mg total) by mouth as needed for diarrhea or loose stools.  . magnesium hydroxide (MILK OF MAGNESIA) 400 MG/5ML suspension Take 30 mLs by mouth daily as needed for mild constipation.  . meclizine (ANTIVERT) 12.5 MG tablet Take 12.5 mg by mouth every 8 (eight) hours as needed (vertigo).  . polyethylene glycol (MIRALAX / GLYCOLAX) packet Take 17 g by mouth daily as needed for mild constipation, moderate constipation or severe constipation.    FAMILY HISTORY:  Her indicated that her mother is deceased. She indicated that her father is alive. She indicated that her maternal grandmother is alive. She indicated that her maternal grandfather is deceased. She indicated that her paternal grandmother is deceased. She indicated that her paternal grandfather is deceased. She indicated that the status of her maternal uncle is unknown.   SOCIAL HISTORY: She  reports  that she has never smoked. She has never used smokeless tobacco. She reports that she does not drink alcohol or use drugs.  REVIEW OF SYSTEMS:   Pt confused, unable to obtain, reviewed from chart and staff  SUBJECTIVE:  Hypotensive   VITAL SIGNS: BP (!) 81/45   Pulse 63   Temp (!) 94.3 F (34.6 C)   Resp 14   Ht 5\' 3"  (1.6 m)   Wt 120 lb (54.4 kg)   SpO2 100%   BMI 21.26 kg/m   HEMODYNAMICS:    VENTILATOR SETTINGS:    INTAKE / OUTPUT: No intake/output data recorded.  PHYSICAL EXAMINATION: General:  Chronically ill appearing female , bair hugger  Neuro:  Lethargic  ,intermittent f/c  HEENT:  Dry mucosa  Cardiovascular:  RRR , no mr/g. Lungs: decreased BS in bases , no wheezing  Abdomen:  Soft, hypoactive bs  Musculoskeletal:  Hand contracture, muscle wasting  Skin: sacral ulcer with dressing , heal ulcer and shin ulcer.   LABS:  BMET Recent Labs  Lab August 27, 2017 0717 08-27-2017 0826  NA 144 145  K 3.3* 3.1*  CL 116* 111  CO2 24  --   BUN 24* 25*  CREATININE 1.72* 1.70*  GLUCOSE 62* 246*    Electrolytes Recent Labs  Lab August 27, 2017 0717  CALCIUM 8.0*    CBC Recent Labs  Lab 27-Aug-2017 0717 2017-08-27 0826  WBC 5.1  --   HGB 7.8* 7.8*  HCT 25.1* 23.0*  PLT 65*  --     Coag's No results for input(s): APTT, INR in the last 168 hours.  Sepsis Markers Recent Labs  Lab 08-27-17 0826 2017/08/27 1012  LATICACIDVEN 1.00 0.60    ABG No results for input(s): PHART, PCO2ART, PO2ART in the last 168 hours.  Liver Enzymes Recent Labs  Lab August 27, 2017 0717  AST 41  ALT 6*  ALKPHOS 537*  BILITOT 0.3  ALBUMIN 1.1*    Cardiac Enzymes No results for input(s): TROPONINI, PROBNP in the last 168 hours.  Glucose Recent Labs  Lab Aug 27, 2017 0718 08/27/2017 0726 2017/08/27 0941 08/27/17 1209  GLUCAP 53* 132* 168* 138*    Imaging Dg Chest Port 1 View  Result Date: August 27, 2017 CLINICAL DATA:  Weakness and hypotension. EXAM: PORTABLE CHEST 1 VIEW COMPARISON:  Chest x-ray dated Jul 19, 2017. FINDINGS: The patient is rotated to the left. Unchanged right upper extremity PICC line with the tip in the SVC. Interval removal of the right internal jugular central venous catheter. Stable cardiomegaly. Increased retrocardiac density. The right lung is clear. No pleural effusion or pneumothorax. No acute osseous abnormality. IMPRESSION: 1. New left lower lobe consolidation, concerning for pneumonia. Electronically Signed   By: Obie Dredge M.D.   On: 27-Aug-2017 08:25     STUDIES:    CULTURES: 08/28/22 BC >> 08/28/2022 UC >>  ANTIBIOTICS: 2022/08/28  Zyvox>> 08/28/22 Azactam >> 08/28/2022 Merrem >>   SIGNIFICANT EVENTS:   LINES/TUBES:   DISCUSSION: 52 yo female with MS, neurogenic bladder w/ chronic foley , chronic ulcers /osteomyelitis admitted with Sepsis ?PNA 2022-08-28   ASSESSMENT / PLAN:  PULMONARY A: LLL PNA  P:   Continue on IV abx  Tr cxr   CARDIOVASCULAR A:  Septic Shock  D CHF   P:  Begin Levophed , titrate for MAP >65   RENAL A:   Acute on Chronic Renal Failure  Neurogenic bladder , chronic foley  P:   Avoid nephrotoxin  IV hydration as tolerated   GASTROINTESTINAL A:  H/o dysphagia  P:   NPO for now  Pepcid   HEMATOLOGIC A:   Anemia  P:  Tr CBC  Transfuse per protocol  Hep SQ for VTE   INFECTIOUS A:   Sepsis with LLL PNA  Hx of chronic UTI  Hx of VRE/Proteus bacteremia /MRSA  P:   Follow cx data   ENDOCRINE A:   DM    Hypoglycemia on arrival -resolved  P:   Check cbg  Hold SSI for now , will need to monitor bs   NEUROLOGIC A:   Acute metabolic encephalopathy in setting of septic shock  P:   Avoid sedating rx Teriflunomide on hold    FAMILY  - Updates: no family at bedside    - Inter-disciplinary family meet or Palliative Care meeting due by:  Day 7    Tammy Parrett NP -C  Pulmonary and Critical Care Medicine [facility] Pager: [phone]  08/18/2017, 2:22 PM

## 2017-08-18 NOTE — Evaluation (Signed)
Clinical/Bedside Swallow Evaluation Patient Details  Name: Debra [name] MRN: [id number] Date of Birth: [birth year]  Today's Date: 08/18/2017 Time: SLP Start Time (ACUTE ONLY): 1650 SLP Stop Time (ACUTE ONLY): 1705 SLP Time Calculation (min) (ACUTE ONLY): 15 min  Past Medical History:  Past Medical History:  Diagnosis Date  . Adenomatous colonic polyps   . Anemia    2005  . Anxiety    1990  . Arthritis    "knees, hands" (05/07/2017)  . Asthma    2000  . Benign essential HTN   . Cataract   . Chronic lower back pain   . Decubital ulcer   . Depression with psychotic symptoms   . Diabetic ketoacidosis without coma associated with type 1 diabetes mellitus (HCC)   . Difficult intubation    narrow airway  . Fibromyalgia 11/07/2016  . Gastroesophageal Reflux Disease (GERD)   . Heart murmur    Birth  . Hyperlipidemia    2005  . Hypertension    1998  . Internal hemorrhoids 04/24/06   on colonoscopy  . Migraine    "used to have them all the time; now maybe q6 months" (05/07/2017)  . Multiple sclerosis (HCC)   . Neuropathic bladder   . Neuropathy of the hands & feet   . Restless Leg Syndrome   . Sepsis due to urinary tract infection (HCC) 04/12/2017  . Sleep paralysis   . Stable angina pectoris    2007: cath showing normal cors.   . Stroke 1990   denies residual on 05/07/2017  . Type I Diabetes Mellitus 1988   Past Surgical History:  Past Surgical History:  Procedure Laterality Date  . BREAST SURGERY Left    biopsy left breast  . CARDIAC CATHETERIZATION  ~ 2007/2008  . COLONOSCOPY W/ BIOPSIES AND POLYPECTOMY    . DEBRIDMENT OF DECUBITUS ULCER N/A 05/21/2017   Procedure: DEBRIDMENT OF SACRAL DECUBITUS ULCER;  Surgeon: Claud Kelp, MD;  Location: [facility] OR;  Service: General;  Laterality: N/A;  . DILATION AND CURETTAGE OF UTERUS  12/29/2010   Surgeon: Tereso Newcomer, MD;  Location: [facility] ORS;  Service: Gynecology  . ENDOMETRIAL ABLATION W/ NOVASURE N/A 12/2009  . EYE  SURGERY Bilateral    "lots; laser tto correct broken blood vessels from diabetes"  . FOOT SURGERY Bilateral    "bones fused; bones removed"  . FRACTURE SURGERY    . HYSTEROSCOPY  12/29/2010   Procedure: HYSTEROSCOPY WITH HYDROTHERMAL ABLATION;  Surgeon: Tereso Newcomer, MD;  Location: [facility] ORS;  Service: Gynecology;;  . IUD REMOVAL  12/29/2010   Procedure: INTRAUTERINE DEVICE (IUD) REMOVAL;  Surgeon: Tereso Newcomer, MD;  Location: [facility] ORS;  Service: Gynecology;  Laterality: N/A;  . LAPAROSCOPIC CHOLECYSTECTOMY    . LAPAROSCOPIC TUBAL LIGATION  12/29/2010   Procedure: LAPAROSCOPIC TUBAL LIGATION;  Surgeon: Tereso Newcomer, MD;  Location: [facility] ORS;  Service: Gynecology;  Laterality: Bilateral;  . ORIF ANKLE FRACTURE Right 10/09/2013   Procedure: Open reduction internal fixation right ankle;  Surgeon: Mable Paris, MD;  Location: [facility] OR;  Service: Orthopedics;  Laterality: Right;  Open reduction internal fixation right ankle  . TRIGGER FINGER RELEASE     x 3  . TUBAL LIGATION     HPI:  52 yo female with MS, neurogenic bladder w/ chronic foley , chronic ulcers /osteomyelitis admitted with Sepsis ?PNA 6/22. CXR 08/18/17 showed new left lower lobe consolidation, concerning for pneumonia. Seen by ST during prior admissions in Feb 2019 and most  recently May 2019, followed briefly due to reported difficulty with solids, pocketing foods. Advanced to regular/thin prior to d/c.    Assessment / Plan / Recommendation Clinical Impression   Pt presents with moderate risk for aspiration, due to deconditioning and questionable pneumonia per CXR. Oral movements are slow and weak; pt has difficulty building intraoral pressure but is ultimately able to retrieve thin liquids via straw sips, with encouragement. Hyolaryngeal excursion appears adequate to palpation, swallow appears timely and airway protection appears adequate. Vocal quality is weak, but clear post-swallow. Pt declined solids, purees. Pt's aunt  reports pt "can't do solids," and they have been considering a feeding tube to help her maintain nutrition. Prior to admission she was able to take some purees, such as applesauce. RN reported rhonchi improving after NTS but noted pt without gag reflex. Recommend initiating full liquids; pt and aunt in agreement. Position upright; if pt coughing or in respiratory distress with PO, hold POs and page SLP for re-evaluation. D/w RN. Will consider MBS to rule out silent aspiration and for objective evaluation of swallow function. Will follow up.      SLP Visit Diagnosis: Dysphagia, unspecified (R13.10)    Aspiration Risk  Moderate aspiration risk    Diet Recommendation Thin liquid   Liquid Administration via: Cup;Straw Medication Administration: Other (Comment)(as tolerated) Supervision: Staff to assist with self feeding Compensations: Slow rate;Small sips/bites Postural Changes: Other (Comment)(Upright)    Other  Recommendations Oral Care Recommendations: Oral care QID   Follow up Recommendations Skilled Nursing facility      Frequency and Duration min 2x/week  2 weeks       Prognosis Prognosis for Safe Diet Advancement: Fair Barriers to Reach Goals: Motivation      Swallow Study   General Date of Onset: 08/18/17 HPI: 52 yo female with MS, neurogenic bladder w/ chronic foley , chronic ulcers /osteomyelitis admitted with Sepsis ?PNA 6/22. CXR 08/18/17 showed new left lower lobe consolidation, concerning for pneumonia. Seen by ST during prior admissions in Feb 2019 and most recently May 2019, followed briefly due to reported difficulty with solids, pocketing foods. Advanced to regular/thin prior to d/c.  Type of Study: Bedside Swallow Evaluation Previous Swallow Assessment: see HPI Diet Prior to this Study: NPO Temperature Spikes Noted: (90.5 in ED; bear hugger) Respiratory Status: Nasal cannula History of Recent Intubation: No Behavior/Cognition: Alert;Requires cueing Oral Cavity  Assessment: Within Functional Limits Oral Care Completed by SLP: No Oral Cavity - Dentition: Edentulous;Dentures, not available Self-Feeding Abilities: Total assist Patient Positioning: Upright in bed Baseline Vocal Quality: Low vocal intensity Volitional Cough: Cognitively unable to elicit Volitional Swallow: Unable to elicit    Oral/Motor/Sensory Function Overall Oral Motor/Sensory Function: Generalized oral weakness(RN reports no gag reflex with NTS) Facial ROM: Reduced right;Reduced left Lingual ROM: Reduced right;Reduced left Lingual Symmetry: Within Functional Limits Lingual Strength: Reduced Mandible: Impaired   Ice Chips Ice chips: Impaired Presentation: Spoon Oral Phase Impairments: Reduced lingual movement/coordination Oral Phase Functional Implications: Prolonged oral transit;Oral holding   Thin Liquid Thin Liquid: Impaired Presentation: Straw Oral Phase Impairments: Reduced labial seal Oral Phase Functional Implications: (difficulty with intraoral pressure for straw retrieval)    Nectar Thick Nectar Thick Liquid: Not tested   Honey Thick Honey Thick Liquid: Not tested   Puree Puree: Not tested Other Comments: pt declined   Solid   GO   Solid: Not tested       Rondel Baton, MS, CCC-SLP Speech-Language Pathologist [phone]  Arlana Lindau 08/18/2017,5:17 PM

## 2017-08-18 NOTE — ED Triage Notes (Signed)
Pt arrives via EMS from [facility] with reports of AMS, hypotensive, hypoglycemic. EMS vitals 65/49, CBG of 39. Pt hx of MS. O2 sats RA 84%, 100% on 3L.

## 2017-08-18 NOTE — H&P (Signed)
Date: 08/18/2017               Patient Name:  Debra [name] MRN: [id number]  DOB: January 21, 1966 Age / Sex: 52 y.o., female   PCP: Aldine Contes, MD         Medical Service: Internal Medicine Teaching Service         Attending Physician: Dr. Juanito Doom, MD    First Contact: Dr. Frederico Hamman Pager: [phone]  Second Contact: Dr. Danford Bad Pager: [phone]       After Hours (After 5p/  First Contact Pager: [phone]  weekends / holidays): Second Contact Pager: [phone]   Chief Complaint: Altered mental status   History of Present Illness:  Debra [name] is a 52 year old female with a complicated medical history including MS, neurogenic bladder with chronic indwelling catheter and bowel incontinence, chronic sacral ulcers and osteomyelitis, multiple UTIs with history of VRE and ESBL Klebsiella, T1DM, and multiple admissions for infectious processes leading to sepsis and DKA who presents from [facility] with acute encephalopathy. She was found to be hypotensive and hypoglycemic by EMS and taken to the ED. Patient is somnolent, able to provide minimal history. Denies recent acute illness, chest pain, SOB, cough, abdominal pain, nausea or vomiting. Her aunt was present in the room during our encounter. She visits patient frequently and has not noted significant changes in patient's clinical status other than increased fatigue. She reports patient's appetite has been worse than usual and is concerned patient having auditory hallucinations as patient recently stated "something told me not to eat because I was going to be strangled." Aunt is HCPOA and confirms patient is full code.   On arrival to the ED she was noted to be hypothermic 90.5, hypotensive, hypoglcyemic and was found to have a new LLL consolidation.Her EKG also showed afib with RVR which is new. Blood work remarkable for thrombocytopenia 65, Cr 1.7, and Alk Phos 534. No leukocytosis and hemoglobin at baseline. She received 1L NS bolus x2  in the ED with no improvement in BP.   Meds:  Current Meds  Medication Sig  . Amino Acids-Protein Hydrolys (FEEDING SUPPLEMENT, PRO-STAT SUGAR FREE 64,) LIQD Take 30 mLs by mouth 3 (three) times daily between meals.  Marland Kitchen amLODipine (NORVASC) 10 MG tablet Take 1 tablet (10 mg total) by mouth daily.  Marland Kitchen aspirin EC 81 MG tablet Take 1 tablet (81 mg total) by mouth daily.  Marland Kitchen atorvastatin (LIPITOR) 40 MG tablet Take 1 tablet (40 mg total) by mouth daily. (Patient taking differently: Take 40 mg by mouth at bedtime. )  . Baclofen 5 MG TABS Take 5 mg by mouth 3 (three) times daily.  . bethanechol (URECHOLINE) 50 MG tablet Take 1 tablet (50 mg total) by mouth 3 (three) times daily.  Marland Kitchen doxycycline (VIBRAMYCIN) 50 MG capsule Take 2 capsules (100 mg total) by mouth 2 (two) times daily. Starting 6/17 (Patient taking differently: Take 100 mg by mouth 2 (two) times daily. Starting 6/18 for about 4 weeks (end 7/18))  . DULoxetine (CYMBALTA) 30 MG capsule Take 1 capsule (30 mg total) by mouth daily.  . ferrous sulfate 325 (65 FE) MG tablet Take 325 mg by mouth daily with breakfast.  . Heparin Lock Flush (HEPARIN FLUSH, PORCINE,) 100 UNIT/ML injection 500 Units by Intracatheter route every 8 (eight) hours. Use for IV ABT using SASH method  . insulin glargine (LANTUS) 100 UNIT/ML injection Inject 0.05 mLs (5 Units total) into the skin at bedtime.  Marland Kitchen  Lactobacillus (ACIDOPHILUS) 100 MG CAPS Take 100 mg by mouth 2 (two) times daily.  Marland Kitchen lidocaine (LIDODERM) 5 % Place 1 patch onto the skin daily. FOR LOWER BACK PAIN Remove & Discard patch within 12 hours or as directed by MD  . lisinopril (PRINIVIL,ZESTRIL) 20 MG tablet Take 1 tablet (20 mg total) by mouth daily.  . metoprolol succinate (TOPROL-XL) 100 MG 24 hr tablet Take 1 tablet (100 mg total) by mouth daily. Take with or immediately following a meal.  . mirtazapine (REMERON) 45 MG tablet Take 1 tablet (45 mg total) by mouth at bedtime.  . modafinil (PROVIGIL) 100 MG  tablet Take 1 tablet (100 mg total) by mouth every morning. (Patient taking differently: Take 100 mg by mouth daily. )  . Multiple Vitamin (MULTIVITAMIN) tablet Take 1 tablet by mouth daily.  . Nutritional Supplements (ENSURE PO) Take 237 mLs by mouth 3 (three) times daily.  . ondansetron (ZOFRAN) 4 MG tablet Take 4 mg by mouth daily.  . pantoprazole (PROTONIX) 40 MG tablet Take 1 tablet (40 mg total) by mouth daily.  . tamsulosin (FLOMAX) 0.4 MG CAPS capsule Take 1 capsule (0.4 mg total) daily by mouth. (Patient taking differently: Take 0.4 mg by mouth daily after supper. )  . Teriflunomide 14 MG TABS Take 14 mg by mouth daily.  . Vitamin D, Ergocalciferol, (DRISDOL) 50000 units CAPS capsule Take 1 capsule (50,000 Units total) by mouth every 7 (seven) days. (Patient taking differently: Take 50,000 Units by mouth every 7 (seven) days. On Friday)     Allergies: Allergies as of 08/18/2017 - Review Complete 08/18/2017  Allergen Reaction Noted  . Penicillins Anaphylaxis, Nausea And Vomiting, and Rash 07/06/2010  . Lactose intolerance (gi) Other (See Comments) 06/26/2017  . Pollen extract Other (See Comments) 11/15/2016  . Tape Rash 02/24/2016   Past Medical History:  Diagnosis Date  . Adenomatous colonic polyps   . Anemia    2005  . Anxiety    1990  . Arthritis    "knees, hands" (05/07/2017)  . Asthma    2000  . Benign essential HTN   . Cataract   . Chronic lower back pain   . Decubital ulcer   . Depression with psychotic symptoms   . Diabetic ketoacidosis without coma associated with type 1 diabetes mellitus ([place])   . Difficult intubation    narrow airway  . Fibromyalgia 11/07/2016  . Gastroesophageal Reflux Disease (GERD)   . Heart murmur    Birth  . Hyperlipidemia    2005  . Hypertension    1998  . Internal hemorrhoids 04/24/06   on colonoscopy  . Migraine    "used to have them all the time; now maybe q6 months" (05/07/2017)  . Multiple sclerosis ([place])   . Neuropathic  bladder   . Neuropathy of the hands & feet   . Restless Leg Syndrome   . Sepsis due to urinary tract infection ([place]) 04/12/2017  . Sleep paralysis   . Stable angina pectoris    2007: cath showing normal cors.   . Stroke 1990   denies residual on 05/07/2017  . Type I Diabetes Mellitus 1988    Family History:  Family History  Problem Relation Age of Onset  . Hypertension Mother   . Kidney disease Mother   . Hypertension Father   . Breast cancer Maternal Grandmother   . Prostate cancer Maternal Grandfather   . Ovarian cancer Paternal Grandmother   . Prostate cancer Paternal Grandfather   .  Colon cancer Maternal Uncle        Family history of malignant neoplasm of gastrointestinal tract    Social History:  Social History   Tobacco Use  . Smoking status: Never Smoker  . Smokeless tobacco: Never Used  Substance Use Topics  . Alcohol use: No    Alcohol/week: 0.0 oz  . Drug use: No    Comment: drug addict     Review of Systems: A complete ROS was negative except as per HPI.   Physical Exam: Blood pressure 98/64, pulse 68, temperature (!) 97.5 F (36.4 C), temperature source Oral, resp. rate 13, height '5\' 3"'$  (1.6 m), weight 120 lb (54.4 kg), SpO2 100 %.   Physical Exam  Constitutional:  Chronically ill appearing female, somnolent but arousable to voice in no acute distress   HENT:  Head: Normocephalic and atraumatic.  Eyes: Conjunctivae are normal. No scleral icterus.  Neck: Normal range of motion. Neck supple.  Cardiovascular:  RRR, nl S1/S2, II/VI SEM best hear at upper sternal borders unchanged from prior exams   Pulmonary/Chest:  Clear anteriorly. No increased work of breathing while on room air   Abdominal:  Soft, NTND, normoactive bowel sounds   Musculoskeletal: She exhibits edema (swelling of L elbow without erythema or warmth ).  PICC line on LUE. Bandaged last changed on 6/19  Neurological:  Patient is somnolent but arousable to voice. She is able to  follow commands. Bilateral upper extremities spastic. Unable to lift bilateral LE.   Skin:  Chronic sacral ulcer with some surrounding necrotic tissue with possible pocket tracking superiorly. No visible purulence or discharge.     EKG: personally reviewed my interpretation is Afib with RVR, new compared to prior  CXR: personally reviewed my interpretation: poor quality imaging, XR rotated, LLL infiltrate/effusion  Assessment & Plan by Problem: Active Problems:   Sepsis ([place])  # Septic shock: Patient presenting with acute metabolic encephalopathy and found to be hypothermic, bradycardic, and hypotensive. She received a total of 3L NS with no improvement BP. MAPs initially > 65, now at 60. Multiple potential sources of infection including new LLL infiltrate, UTI (chronic indwelling catheter, unclear when was last exchanged), chronic osteomyelitis, PICC line on LUE. Started on linezolid and meropenem based on previous culture data. Would continue to monitor platelet count as she presented with new thrombocytopenia and is currently on linezolid. Initially admitted to stepdown. However, given deteriorating clinical status PCCM was consulted and patient will be admitted to the ICU for pressor support. Will assume patient care when she is medically stable for Stepdown/Floor.   # Afib with RVR:  Noted on initial EKG and no history of AFib. Would repeat to confirm as patient persistently bradycardic during this admission.   # AKI: Cr 1.7 on admission with historical baseline of 1.2-1.3. She is also anuric and per RN has only made 20 cc of urine despite IVF resuscitation. Pre-renal from poor PO intake vs intra-renal from hypotension and/or blockage of Foley. Lactic acid 1.0, though this was drawn when MAP > 65. Would expect this to improve as she has received IVF and is now on pressor support. Unclear when foley catheter was changed, recommend flushing to check for blockage and/or exchanging.    # Elevated  Alk Phos: Chronically elevated Alk phos, though never as high as on admission at 534. Unclear etiology. History of cholecystectomy and recent abdomen US from 06/2017 with nl CBD and no stones seen. She did have a 2cm hyperechoic focus in the  R liver for which MRI abdomen was recommended for further evaluation. No transaminitis noted today. ? High bone turnover, thyroid/parathyroid disease, malignancy, etc.   Dispo: Admit patient to Inpatient with expected length of stay greater than 2 midnights.  SignedWelford Roche, MD 08/18/2017, 5:23 PM  Pager: [phone]

## 2017-08-18 NOTE — ED Notes (Signed)
Multiple wounds noted. Unstageable to sacrum, wound to L heel and R shin

## 2017-08-18 NOTE — ED Notes (Signed)
Critical Care at  [facility]

## 2017-08-19 ENCOUNTER — Inpatient Hospital Stay (HOSPITAL_COMMUNITY): Payer: Medicaid Other

## 2017-08-19 LAB — CBC
HCT: 20.8 % — ABNORMAL LOW (ref 36.0–46.0)
HCT: 27 % — ABNORMAL LOW (ref 36.0–46.0)
Hemoglobin: 6.7 g/dL — CL (ref 12.0–15.0)
Hemoglobin: 8.7 g/dL — ABNORMAL LOW (ref 12.0–15.0)
MCH: 27.7 pg (ref 26.0–34.0)
MCH: 28.4 pg (ref 26.0–34.0)
MCHC: 32.2 g/dL (ref 30.0–36.0)
MCHC: 32.2 g/dL (ref 30.0–36.0)
MCV: 86 fL (ref 78.0–100.0)
MCV: 88.2 fL (ref 78.0–100.0)
PLATELETS: 51 10*3/uL — AB (ref 150–400)
PLATELETS: 54 10*3/uL — AB (ref 150–400)
RBC: 2.42 MIL/uL — ABNORMAL LOW (ref 3.87–5.11)
RBC: 3.06 MIL/uL — AB (ref 3.87–5.11)
RDW: 20.2 % — ABNORMAL HIGH (ref 11.5–15.5)
RDW: 18 % — AB (ref 11.5–15.5)
WBC: 5.1 10*3/uL (ref 4.0–10.5)
WBC: 5.4 10*3/uL (ref 4.0–10.5)

## 2017-08-19 LAB — COMPREHENSIVE METABOLIC PANEL
ALT: 6 U/L — AB (ref 14–54)
AST: 29 U/L (ref 15–41)
Albumin: 1 g/dL — ABNORMAL LOW (ref 3.5–5.0)
Alkaline Phosphatase: 454 U/L — ABNORMAL HIGH (ref 38–126)
Anion gap: 5 (ref 5–15)
BILIRUBIN TOTAL: 0.7 mg/dL (ref 0.3–1.2)
BUN: 19 mg/dL (ref 6–20)
CO2: 19 mmol/L — ABNORMAL LOW (ref 22–32)
CREATININE: 1.48 mg/dL — AB (ref 0.44–1.00)
Calcium: 6.8 mg/dL — ABNORMAL LOW (ref 8.9–10.3)
Chloride: 121 mmol/L — ABNORMAL HIGH (ref 101–111)
GFR calc Af Amer: 47 mL/min — ABNORMAL LOW (ref >60)
GFR, EST NON AFRICAN AMERICAN: 40 mL/min — AB (ref >60)
GLUCOSE: 123 mg/dL — AB (ref 65–99)
Potassium: 3 mmol/L — ABNORMAL LOW (ref 3.5–5.1)
Sodium: 145 mmol/L (ref 135–145)
TOTAL PROTEIN: 4 g/dL — AB (ref 6.5–8.1)

## 2017-08-19 LAB — GLUCOSE, CAPILLARY
GLUCOSE-CAPILLARY: 87 mg/dL (ref 65–99)
GLUCOSE-CAPILLARY: 110 mg/dL — AB (ref 65–99)
GLUCOSE-CAPILLARY: 116 mg/dL — AB (ref 65–99)
GLUCOSE-CAPILLARY: 156 mg/dL — AB (ref 65–99)
Glucose-Capillary: 103 mg/dL — ABNORMAL HIGH (ref 65–99)
Glucose-Capillary: 189 mg/dL — ABNORMAL HIGH (ref 65–99)

## 2017-08-19 LAB — LACTIC ACID, PLASMA: LACTIC ACID, VENOUS: 0.9 mmol/L (ref 0.5–1.9)

## 2017-08-19 LAB — PREPARE RBC (CROSSMATCH)

## 2017-08-19 LAB — CORTISOL: Cortisol, Plasma: 20.5 ug/dL

## 2017-08-19 MED ORDER — COLLAGENASE 250 UNIT/GM EX OINT
TOPICAL_OINTMENT | Freq: Every day | CUTANEOUS | Status: DC
  Administered 2017-08-19: 17:00:00 via TOPICAL
  Administered 2017-08-20 – 2017-08-21 (×2): 1 via TOPICAL
  Administered 2017-08-22 – 2017-09-03 (×11): via TOPICAL
  Administered 2017-09-04: 1 via TOPICAL
  Administered 2017-09-05 – 2017-09-07 (×3): via TOPICAL
  Filled 2017-08-19 (×4): qty 30

## 2017-08-19 MED ORDER — POTASSIUM CHLORIDE 10 MEQ/100ML IV SOLN
10.0000 meq | INTRAVENOUS | Status: AC
  Administered 2017-08-19 (×3): 10 meq via INTRAVENOUS
  Filled 2017-08-19 (×3): qty 100

## 2017-08-19 MED ORDER — POTASSIUM CHLORIDE 10 MEQ/100ML IV SOLN
10.0000 meq | INTRAVENOUS | Status: DC
  Administered 2017-08-19: 10 meq via INTRAVENOUS
  Filled 2017-08-19: qty 100

## 2017-08-19 MED ORDER — SODIUM CHLORIDE 0.9% IV SOLUTION
Freq: Once | INTRAVENOUS | Status: AC
  Administered 2017-08-19: 10 mL/h via INTRAVENOUS

## 2017-08-19 MED ORDER — INSULIN ASPART 100 UNIT/ML ~~LOC~~ SOLN
0.0000 [IU] | SUBCUTANEOUS | Status: DC
  Administered 2017-08-19: 2 [IU] via SUBCUTANEOUS
  Administered 2017-08-20: 1 [IU] via SUBCUTANEOUS
  Administered 2017-08-20: 2 [IU] via SUBCUTANEOUS

## 2017-08-19 NOTE — Progress Notes (Signed)
PULMONARY / CRITICAL CARE MEDICINE   Name: Debra [name] MRN: [id number] DOB: 1965-07-10    ADMISSION DATE:  2017/08/25 CONSULTATION DATE:  25-Aug-2017  REFERRING MD:  TRH , Dr. Evelene Croon    CHIEF COMPLAINT:  Hypotension   HISTORY OF PRESENT ILLNESS:   52 yo female NH patient with MS , neurogenic bladder with chronic foley , bowel incontinence , chronic sacral ulcers and osteomyelitis , chronic UTI with hx of VRE/ESBL Kelbsiella , IDDM . PMH significant for multiple admissions in past with Sepsis with DKA . Admitted 08-26-2022 with hypothermia ,hypoglycemia ,  lethargic and hypotensive. CXR shows LLL consolidation .  She was started on sepsis protocol . Received 4L NS and MAP b/p remains low. PCCM consulted for hypotension .   SUBJECTIVE:  Weaned off pressors .  Improved alertness.  Chronic anemia , hbg tr down now <7 , no apparent active bleeding  plt tr down  Scr tr down , UOP tr up      VITAL SIGNS: BP 103/79   Pulse 63   Temp 97.6 F (36.4 C) (Oral)   Resp 10   Ht 5\' 3"  (1.6 m)   Wt 123 lb 0.3 oz (55.8 kg)   SpO2 98%   BMI 21.79 kg/m   HEMODYNAMICS:    VENTILATOR SETTINGS:    INTAKE / OUTPUT: I/O last 3 completed shifts: In: 6467.4 [I.V.:1532; IV Piggyback:4935.4] Out: 700 [Urine:700]  PHYSICAL EXAMINATION: General:  Chronically ill appearing female  Neuro:  Increased alertness , f/c  HEENT:  Dry mucosa , AT /[place]  Cardiovascular:  RRR , No mrg  Lungs:Decreased BS in bases  Abdomen:  Soft BS +  Musculoskeletal:  Hand contractures , muscle wasting  Skin: sacral, heal and shin ulcers.  LABS:  BMET Recent Labs  Lab 08/25/17 0717 2017-08-25 0826 08/19/17 0334  NA 144 145 145  K 3.3* 3.1* 3.0*  CL 116* 111 121*  CO2 24  --  19*  BUN 24* 25* 19  CREATININE 1.72* 1.70* 1.48*  GLUCOSE 62* 246* 123*    Electrolytes Recent Labs  Lab 08/25/2017 0717 08/19/17 0334  CALCIUM 8.0* 6.8*    CBC Recent Labs  Lab 2017/08/25 0717 08-25-17 0826 08/19/17 0334   WBC 5.1  --  5.1  HGB 7.8* 7.8* 6.7*  HCT 25.1* 23.0* 20.8*  PLT 65*  --  51*    Coag's No results for input(s): APTT, INR in the last 168 hours.  Sepsis Markers Recent Labs  Lab 08/25/2017 0826 August 25, 2017 1012 08/19/17 0738  LATICACIDVEN 1.00 0.60 0.9    ABG No results for input(s): PHART, PCO2ART, PO2ART in the last 168 hours.  Liver Enzymes Recent Labs  Lab 25-Aug-2017 0717 08/19/17 0334  AST 41 29  ALT 6* 6*  ALKPHOS 537* 454*  BILITOT 0.3 0.7  ALBUMIN 1.1* 1.0*    Cardiac Enzymes No results for input(s): TROPONINI, PROBNP in the last 168 hours.  Glucose Recent Labs  Lab 25-Aug-2017 1641 August 25, 2017 2016 08-25-2017 2343 08/19/17 0154 08/19/17 0415 08/19/17 0833  GLUCAP 119* 97 98 87 103* 110*    Imaging No results found.   STUDIES:    CULTURES: 08/26/22 BC >> 2022-08-26 UC >>  ANTIBIOTICS: 26-Aug-2022 Zyvox>> 08/26/22 Azactam >> 08/26/2022 Merrem >>   SIGNIFICANT EVENTS:   LINES/TUBES:   DISCUSSION: 52 yo female with MS, neurogenic bladder w/ chronic foley , chronic ulcers /osteomyelitis admitted with Sepsis ?PNA 2022-08-26   ASSESSMENT / PLAN:  PULMONARY A: LLL PNA  P:  Continue on IV abx  CXR in am   CARDIOVASCULAR A:  Septic Shock -improving weaned off pressors 6/22  D CHF  -echo 06/2017 EF 60% LA tr down  P:  monitor    RENAL A:   Acute on Chronic Renal Failure -improving  Neurogenic bladder , chronic foley ' Hypokalemia  P:   Avoid nephrotoxin  IV hydration as tolerated  I/O  Replace K +   GASTROINTESTINAL A:   H/o dysphagia -ST eval moderate aspiration risk recommend thin liquids P:   Full liquid diet  /aspiration precautions  Pepcid   HEMATOLOGIC A:   Anemia -chronic -no sign of active bleeding  P:  Tr CBC  Transfuse 1u PRBC  Hep SQ for VTE -cont for now as high risk for VTE but  Monitor closely with anemia   INFECTIOUS A:   Sepsis with LLL PNA  Hx of chronic UTI  Hx of VRE/Proteus bacteremia /MRSA  P:   Follow cx data    ENDOCRINE A:   DM    Hypoglycemia on arrival -resolved , BS remain low norm  P:   Check cbg  Hold SSI for now , will need to monitor bs   NEUROLOGIC A:   Acute metabolic encephalopathy in setting of septic shock  P:   Avoid sedating rx Teriflunomide on hold    FAMILY  - Updates: no family at bedside    - Inter-disciplinary family meet or Palliative Care meeting due by:  Day 7    Xoey Warmoth NP -C  Pulmonary and Critical Care Medicine [facility] Pager: [phone]  08/19/2017, 9:12 AM

## 2017-08-19 NOTE — Consult Note (Addendum)
WOC Nurse wound consult note Reason for Consult: Consult requested for sacrum and BLE. Wound type: Sacrum stage 4 pressure injury; 6X9X1cm, bone visible, 40% slough/eschar, 60% red, mod amt tan drainage, no odor  Left ischium unstageable; .5X.5cm, 100% eschar, no odor or drainage or fluctuance Right anterior calf full thickness wound, 100% slough, no odor or drainage, 3X2cm Left heel deep tissue injury; .5X.5cm intact darker colored skin Left inner leg healing full thickness wound; 2X1X.1cm, pink and dry, no odor or drainage Right ankle with deep tissue injury; .5X.5cm, intact darker colored skin Right inner heel with healing previous stage 3 pressure injury; 2.5X.5cmX.1cm, pink dry skin Pressure Injury POA: Yes Dressing procedure/placement/frequency: Pt is critically ill with multiple systemic factors which can impair healing.  She is on a low airloss bed to decrease pressure. Prevalon boots in place to reduce pressure to BLE.  Santyl to provide enzymatic debridement of nonviable tissue to sacrum, left ischium, and right leg wound. No family members present to discuss plan of care.   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  This complex medical condition is beyond the scope of practice for WOC nursing. Recommend surgical consult for further input and possible debridement of nonviable tissue.  Discussed plan of care with critical care team. Please re-consult if further assistance is needed.  Thank-you,  Cammie Mcgee MSN, RN, CWOCN, [place], CNS [phone]

## 2017-08-19 NOTE — Evaluation (Signed)
Physical Therapy Evaluation and Discharge Patient Details Name: Debra [name] MRN: [id number] DOB: 01/26/1966 Today's Date: 08/19/2017   History of Present Illness  Pt is a 52 y.o. female Debra [name] is a 52 year old female with a complicated medical history including MS, neurogenic bladder with chronic indwelling catheter and bowel incontinence, chronic sacral ulcers and osteomyelitis, multiple UTIs with history of VRE and ESBL Klebsiella, T1DM, and multiple admissions for infectious processes leading to sepsis and DKA who presents from [facility] with acute encephalopathy. She was found to be hypotensive and hypoglycemic by EMS and taken to the ED. She has had diminished appetite as well as auditory hallucinations.     Clinical Impression  Patient evaluated by Physical Therapy with no further PT needs identified. Patient has been a long-term resident at SNF (does not receive therapies). She does not get lifted OOB due to sacral decubitus. She is minimally able to assist with bed mobility due to limited use of UEs and almost no active movement of legs. PT is signing off. Thank you for this referral.     Follow Up Recommendations No PT follow up;SNF(long-term resident at [facility])    Equipment Recommendations  None recommended by PT    Recommendations for Other Services       Precautions / Restrictions Precautions Precautions: Fall Precaution Comments: unstageable sacral wound      Mobility  Bed Mobility Overal bed mobility: Needs Assistance Bed Mobility: Rolling Rolling: Mod assist;Max assist;+2 for safety/equipment;+2 for physical assistance         General bed mobility comments: Mod assist to the R and max assist +2 to the L.   Transfers                 General transfer comment: EOB deferred due to low BP  Ambulation/Gait                Stairs            Wheelchair Mobility    Modified Rankin (Stroke Patients Only)       Balance                                              Pertinent Vitals/Pain Pain Assessment: Faces Faces Pain Scale: Hurts little more Pain Location: back; L UE Pain Descriptors / Indicators: Aching;Discomfort;Moaning;Sore;Tightness;Grimacing Pain Intervention(s): Limited activity within patient's tolerance;Repositioned    Home Living Family/patient expects to be discharged to:: Skilled nursing facility                 Additional Comments: per pt was not receiving skilled therapies PTA    Prior Function Level of Independence: Needs assistance   Gait / Transfers Assistance Needed: Does not move OOB at SNF and does not sit at EOB.   ADL's / Homemaking Assistance Needed: Assistance for all ADL.         Hand Dominance        Extremity/Trunk Assessment   Upper Extremity Assessment Upper Extremity Assessment: Defer to OT evaluation RUE Deficits / Details: Edema to upper arm. Limited PROM R shoulder 0-90 degrees for flexion and abduction. Limited elbow PROM lacking approximately 15 degrees PROM. Tight with all PROM.  LUE Deficits / Details: Significant edema L hand. Lacking approximately 25 degrees passive elbow extension. Limited to 0-90 shoulder PROM abduction and flexion.     Lower  Extremity Assessment Lower Extremity Assessment: RLE deficits/detail;LLE deficits/detail RLE Deficits / Details: painful rt knee with PROM; lacking 20 degrees knee extension; no active movement noted LLE Deficits / Details: painful Lt knee with PROM; lacking 10 degrees full knee extension; knee extension 1+/5, no ankle/toe movement       Communication   Communication: Expressive difficulties(very low volume)  Cognition Arousal/Alertness: Lethargic Behavior During Therapy: WFL for tasks assessed/performed Overall Cognitive Status: Impaired/Different from baseline Area of Impairment: Attention;Awareness                   Current Attention Level: Sustained;Selective        Awareness: Emergent   General Comments: Pt lethargic throughout session and minimally interactive.       General Comments General comments (skin integrity, edema, etc.): prior to session BP 101/66; when HOB raised to 45 degrees BP dropping to 84/60 and after approximately 3 minutes 89/63 and pt with dizziness throughout; returned HOB to 20 degrees 96/57    Exercises     Assessment/Plan    PT Assessment Patent does not need any further PT services  PT Problem List         PT Treatment Interventions      PT Goals (Current goals can be found in the Care Plan section)  Acute Rehab PT Goals Patient Stated Goal: to be able to use her arms; to feel better PT Goal Formulation: All assessment and education complete, DC therapy    Frequency     Barriers to discharge        Co-evaluation PT/OT/SLP Co-Evaluation/Treatment: Yes Reason for Co-Treatment: Complexity of the patient's impairments (multi-system involvement) PT goals addressed during session: Mobility/safety with mobility;Strengthening/ROM         AM-PAC PT "6 Clicks" Daily Activity  Outcome Measure Difficulty turning over in bed (including adjusting bedclothes, sheets and blankets)?: A Lot Difficulty moving from lying on back to sitting on the side of the bed? : Unable Difficulty sitting down on and standing up from a chair with arms (e.g., wheelchair, bedside commode, etc,.)?: Unable Help needed moving to and from a bed to chair (including a wheelchair)?: Total Help needed walking in hospital room?: Total Help needed climbing 3-5 steps with a railing? : Total 6 Click Score: 7    End of Session Equipment Utilized During Treatment: Oxygen Activity Tolerance: Treatment limited secondary to medical complications (Comment)(BP drops with changes in position) Patient left: in bed;with call bell/phone within reach;with bed alarm set(RN to get pt a soft touch call bell) Nurse Communication: Mobility status;Other  (comment)(needs soft touch call bell; not therapy appropriate) PT Visit Diagnosis: Muscle weakness (generalized) (M62.81);Adult, failure to thrive (R62.7)    Time: 6950-7225 PT Time Calculation (min) (ACUTE ONLY): 29 min   Charges:   PT Evaluation $PT Eval Low Complexity: 1 Low     PT G Codes:          [employer], PT 08/19/2017, 10:26 AM

## 2017-08-19 NOTE — Evaluation (Signed)
Occupational Therapy Evaluation Patient Details Name: Debra [name] MRN: [id number] DOB: 1966/01/15 Today's Date: 08/19/2017    History of Present Illness Pt is a 52 y.o. female Debra [name] is a 52 year old female with a complicated medical history including MS, neurogenic bladder with chronic indwelling catheter and bowel incontinence, chronic sacral ulcers and osteomyelitis, multiple UTIs with history of VRE and ESBL Klebsiella, T1DM, and multiple admissions for infectious processes leading to sepsis and DKA who presents from [facility] with acute encephalopathy. She was found to be hypotensive and hypoglycemic by EMS and taken to the ED. She has had diminished appetite as well as auditory hallucinations.    Clinical Impression   PTA, pt was at [facility] requiring assistance for all ADL tasks. She is well known to this therapist from previous admissions. Pt currently requiring total assistance for all aspects of ADL participation. She presents with decreased functional use of B UE as well as BUE swelling and addressed this with positioning this session. Pt is functioning at or near baseline for ADL participation. She would benefit from PROM from nursing staff during admission. No further acute OT needs identified at this time. See below for BP measurements throughout movement with noted drop to 84/63 from 101/66 when [facility] raised to 45 degrees.     Follow Up Recommendations  SNF;Supervision/Assistance - 24 hour    Equipment Recommendations  None recommended by OT    Recommendations for Other Services       Precautions / Restrictions Precautions Precautions: Fall Precaution Comments: unstageable sacral wound      Mobility Bed Mobility Overal bed mobility: Needs Assistance Bed Mobility: Rolling Rolling: Mod assist;Max assist;+2 for safety/equipment;+2 for physical assistance         General bed mobility comments: Mod assist to the R and max assist +2 to the L.   Transfers                      Balance                                           ADL either performed or assessed with clinical judgement   ADL Overall ADL's : Needs assistance/impaired                                       General ADL Comments: Requires overall total assistance for ADL. Facilitated ability to wash face with assistance but unable to perform 25% of task without assistance.      Vision Patient Visual Report: No change from baseline Vision Assessment?: No apparent visual deficits Additional Comments: Does close eyes throughout session at times.      Perception     Praxis      Pertinent Vitals/Pain Pain Assessment: Faces Faces Pain Scale: Hurts little more Pain Location: back; L UE Pain Descriptors / Indicators: Aching;Discomfort;Moaning;Sore;Tightness Pain Intervention(s): Limited activity within patient's tolerance;Monitored during session;Repositioned     Hand Dominance     Extremity/Trunk Assessment Upper Extremity Assessment Upper Extremity Assessment: RUE deficits/detail;LUE deficits/detail RUE Deficits / Details: Edema to upper arm. Limited PROM R shoulder 0-90 degrees for flexion and abduction. Limited elbow PROM lacking approximately 15 degrees PROM. Tight with all PROM.  LUE Deficits / Details: Significant edema L hand. Lacking approximately 25 degrees  passive elbow extension. Limited to 0-90 shoulder PROM abduction and flexion.    Lower Extremity Assessment Lower Extremity Assessment: Defer to PT evaluation       Communication Communication Communication: Expressive difficulties   Cognition Arousal/Alertness: Lethargic Behavior During Therapy: WFL for tasks assessed/performed Overall Cognitive Status: Impaired/Different from baseline Area of Impairment: Attention;Awareness                   Current Attention Level: Sustained;Selective       Awareness: Emergent   General Comments: Pt lethargic thorughout  session and minimally interactive.    General Comments  prior to session BP 101/66; when HOB raised to 45 degrees BP dropping to 84/60 and after approximately 3 minutes 89/63 and pt with dizziness throughout; returned HOB to 20 degrees 96/57    Exercises     Shoulder Instructions      Home Living Family/patient expects to be discharged to:: Skilled nursing facility                                        Prior Functioning/Environment Level of Independence: Needs assistance  Gait / Transfers Assistance Needed: Does not move OOB at SNF and does not sit at EOB.  ADL's / Homemaking Assistance Needed: Assistance for all ADL.             OT Problem List: Decreased strength;Decreased range of motion;Decreased activity tolerance;Decreased safety awareness;Decreased knowledge of use of DME or AE;Decreased knowledge of precautions;Pain      OT Treatment/Interventions:      OT Goals(Current goals can be found in the care plan section) Acute Rehab OT Goals Patient Stated Goal: to be able to use her arms; to feel better OT Goal Formulation: With patient  OT Frequency:     Barriers to D/C:            Co-evaluation              AM-PAC PT "6 Clicks" Daily Activity     Outcome Measure Help from another person eating meals?: Total Help from another person taking care of personal grooming?: Total Help from another person toileting, which includes using toliet, bedpan, or urinal?: Total Help from another person bathing (including washing, rinsing, drying)?: Total Help from another person to put on and taking off regular upper body clothing?: Total Help from another person to put on and taking off regular lower body clothing?: Total 6 Click Score: 6   End of Session Nurse Communication: Mobility status;Other (comment)(pt pad changed due to drainage from wound)  Activity Tolerance: Treatment limited secondary to medical complications (Comment)(BP ) Patient left: in  bed;with call bell/phone within reach  OT Visit Diagnosis: Muscle weakness (generalized) (M62.81);Other symptoms and signs involving cognitive function                Time: 2800-3491 OT Time Calculation (min): 29 min Charges:  OT General Charges $OT Visit: 1 Visit OT Evaluation $OT Eval Moderate Complexity: 1 Mod G-Codes:     Doristine Section, MS OTR/L  Pager: [phone]  Brennley Curtice A Sullivan Jacuinde 08/19/2017, 9:53 AM

## 2017-08-19 NOTE — Consult Note (Signed)
Reason for Consult: Sacral decubitus Referring Physician: Dr. Simonne Maffucci Chief complaint: Hypotension, confusion, encephalopathy.  Debra [name] is an 52 y.o. female.  HPI: Patient is a 52 year old female from skilled nursing facility with history of multiple sclerosis.  She has a neurogenic bladder with chronic Foley, bowel incontinence, a chronic sacral ulcers and osteomyelitis.  She has a history of chronic UTIs with VRE/ESBL Klebsiella, she also has a history of diabetes with prior episodes of sepsis and DKA.  She was brought to the ED by EMS with hypotension and found to have multiple issues including;  sepsis, a left lower lobe pneumonia, chronic with acute renal insufficiency, dysphasia, progressive anemia, metabolic encephalopathy most likely related to her septic shock.  She was seen by wound care therapy and Julien Girt, RN.  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  The bone is visible there is a 40% slaw and eschar.  There is a left issue wound right calf there is a full-thickness wound.  There is also one in the left heel.  We are asked to see for his stage IV wound and probable osteomyelitis.  Past Medical History:  Diagnosis Date  . Adenomatous colonic polyps   . Anemia    2005  . Anxiety    1990  . Arthritis    "knees, hands" (05/07/2017)  . Asthma    2000  . Benign essential HTN   . Cataract   . Chronic lower back pain   . Decubital ulcer   . Depression with psychotic symptoms   . Diabetic ketoacidosis without coma associated with type 1 diabetes mellitus ([place])   . Difficult intubation    narrow airway  . Fibromyalgia 11/07/2016  . Gastroesophageal Reflux Disease (GERD)   . Heart murmur    Birth  . Hyperlipidemia    2005  . Hypertension    1998  . Internal hemorrhoids 04/24/06   on colonoscopy  . Migraine    "used to have them all the time; now maybe q6 months" (05/07/2017)  . Multiple sclerosis ([place])   . Neuropathic  bladder   . Neuropathy of the hands & feet   . Restless Leg Syndrome   . Sepsis due to urinary tract infection ([place]) 04/12/2017  . Sleep paralysis   . Stable angina pectoris    2007: cath showing normal cors.   . Stroke 1990   denies residual on 05/07/2017  . Type I Diabetes Mellitus 1988    Past Surgical History:  Procedure Laterality Date  . BREAST SURGERY Left    biopsy left breast  . CARDIAC CATHETERIZATION  ~ 2007/2008  . COLONOSCOPY W/ BIOPSIES AND POLYPECTOMY    . DEBRIDMENT OF DECUBITUS ULCER N/A 05/21/2017   Procedure: DEBRIDMENT OF SACRAL DECUBITUS ULCER;  Surgeon: Fanny Skates, MD;  Location: [facility];  Service: General;  Laterality: N/A;  . DILATION AND CURETTAGE OF UTERUS  12/29/2010   Surgeon: Osborne Oman, MD;  Location: [facility];  Service: Gynecology  . ENDOMETRIAL ABLATION W/ NOVASURE N/A 12/2009  . EYE SURGERY Bilateral    "lots; laser tto correct broken blood vessels from diabetes"  . FOOT SURGERY Bilateral    "bones fused; bones removed"  . FRACTURE SURGERY    . HYSTEROSCOPY  12/29/2010   Procedure: HYSTEROSCOPY WITH HYDROTHERMAL ABLATION;  Surgeon: Osborne Oman, MD;  Location: [facility];  Service: Gynecology;;  . IUD REMOVAL  12/29/2010   Procedure:  INTRAUTERINE DEVICE (IUD) REMOVAL;  Surgeon: Osborne Oman, MD;  Location: [facility];  Service: Gynecology;  Laterality: N/A;  . LAPAROSCOPIC CHOLECYSTECTOMY    . LAPAROSCOPIC TUBAL LIGATION  12/29/2010   Procedure: LAPAROSCOPIC TUBAL LIGATION;  Surgeon: Osborne Oman, MD;  Location: [facility];  Service: Gynecology;  Laterality: Bilateral;  . ORIF ANKLE FRACTURE Right 10/09/2013   Procedure: Open reduction internal fixation right ankle;  Surgeon: Nita Sells, MD;  Location: [facility];  Service: Orthopedics;  Laterality: Right;  Open reduction internal fixation right ankle  . TRIGGER FINGER RELEASE     x 3  . TUBAL LIGATION      Family History  Problem Relation Age of Onset  . Hypertension Mother   .  Kidney disease Mother   . Hypertension Father   . Breast cancer Maternal Grandmother   . Prostate cancer Maternal Grandfather   . Ovarian cancer Paternal Grandmother   . Prostate cancer Paternal Grandfather   . Colon cancer Maternal Uncle        Family history of malignant neoplasm of gastrointestinal tract    Social History:  reports that she has never smoked. She has never used smokeless tobacco. She reports that she does not drink alcohol or use drugs.  Allergies:  Allergies  Allergen Reactions  . Penicillins Anaphylaxis, Nausea And Vomiting and Rash    Has patient had a PCN reaction causing immediate rash, facial/tongue/throat swelling, SOB or lightheadedness with hypotension: Yes Has patient had a PCN reaction causing severe rash involving mucus membranes or skin necrosis: Yes Has patient had a PCN reaction that required hospitalization No Has patient had a PCN reaction occurring within the last 10 years: Yes If all of the above answers are "NO", then may proceed with Cephalosporin use.   . Lactose Intolerance (Gi) Other (See Comments)  . Pollen Extract Other (See Comments)    Seasonal Allergies  . Tape Rash    Medications:  Prior to Admission:  Medications Prior to Admission  Medication Sig Dispense Refill Last Dose  . Amino Acids-Protein Hydrolys (FEEDING SUPPLEMENT, PRO-STAT SUGAR FREE 64,) LIQD Take 30 mLs by mouth 3 (three) times daily between meals. 900 mL 0 08/18/2017 at Unknown time  . amLODipine (NORVASC) 10 MG tablet Take 1 tablet (10 mg total) by mouth daily. 90 tablet 1 08/18/2017 at Unknown time  . aspirin EC 81 MG tablet Take 1 tablet (81 mg total) by mouth daily. 90 tablet 3 08/17/2017 at Unknown time  . atorvastatin (LIPITOR) 40 MG tablet Take 1 tablet (40 mg total) by mouth daily. (Patient taking differently: Take 40 mg by mouth at bedtime. ) 90 tablet 3 08/17/2017 at Unknown time  . Baclofen 5 MG TABS Take 5 mg by mouth 3 (three) times daily. 5 tablet 0  08/18/2017 at Unknown time  . bethanechol (URECHOLINE) 50 MG tablet Take 1 tablet (50 mg total) by mouth 3 (three) times daily.   08/18/2017 at Unknown time  . doxycycline (VIBRAMYCIN) 50 MG capsule Take 2 capsules (100 mg total) by mouth 2 (two) times daily. Starting 6/17 (Patient taking differently: Take 100 mg by mouth 2 (two) times daily. Starting 6/18 for about 4 weeks (end 7/18)) 360 capsule 0 08/18/2017 at 0900  . DULoxetine (CYMBALTA) 30 MG capsule Take 1 capsule (30 mg total) by mouth daily.  3 08/18/2017 at Unknown time  . ferrous sulfate 325 (65 FE) MG tablet Take 325 mg by mouth daily with breakfast.   08/18/2017 at Unknown  time  . Heparin Lock Flush (HEPARIN FLUSH, PORCINE,) 100 UNIT/ML injection 500 Units by Intracatheter route every 8 (eight) hours. Use for IV ABT using SASH method   Past Week at Unknown time  . insulin glargine (LANTUS) 100 UNIT/ML injection Inject 0.05 mLs (5 Units total) into the skin at bedtime. 10 mL 11 08/17/2017 at Unknown time  . Lactobacillus (ACIDOPHILUS) 100 MG CAPS Take 100 mg by mouth 2 (two) times daily.   08/18/2017 at Unknown time  . lidocaine (LIDODERM) 5 % Place 1 patch onto the skin daily. FOR LOWER BACK PAIN Remove & Discard patch within 12 hours or as directed by MD   08/18/2017 at Unknown time  . lisinopril (PRINIVIL,ZESTRIL) 20 MG tablet Take 1 tablet (20 mg total) by mouth daily.   08/18/2017 at Unknown time  . metoprolol succinate (TOPROL-XL) 100 MG 24 hr tablet Take 1 tablet (100 mg total) by mouth daily. Take with or immediately following a meal. 90 tablet 0 08/17/2017 at 1800  . mirtazapine (REMERON) 45 MG tablet Take 1 tablet (45 mg total) by mouth at bedtime.   08/17/2017 at Unknown time  . modafinil (PROVIGIL) 100 MG tablet Take 1 tablet (100 mg total) by mouth every morning. (Patient taking differently: Take 100 mg by mouth daily. )   08/18/2017 at Unknown time  . Multiple Vitamin (MULTIVITAMIN) tablet Take 1 tablet by mouth daily.   08/18/2017 at  Unknown time  . Nutritional Supplements (ENSURE PO) Take 237 mLs by mouth 3 (three) times daily.   08/18/2017 at Unknown time  . ondansetron (ZOFRAN) 4 MG tablet Take 4 mg by mouth daily.   08/18/2017 at Unknown time  . pantoprazole (PROTONIX) 40 MG tablet Take 1 tablet (40 mg total) by mouth daily.   08/18/2017 at Unknown time  . tamsulosin (FLOMAX) 0.4 MG CAPS capsule Take 1 capsule (0.4 mg total) daily by mouth. (Patient taking differently: Take 0.4 mg by mouth daily after supper. ) 30 capsule  08/17/2017 at Unknown time  . Teriflunomide 14 MG TABS Take 14 mg by mouth daily.   08/18/2017 at Unknown time  . Vitamin D, Ergocalciferol, (DRISDOL) 50000 units CAPS capsule Take 1 capsule (50,000 Units total) by mouth every 7 (seven) days. (Patient taking differently: Take 50,000 Units by mouth every 7 (seven) days. On Friday) 30 capsule  08/14/2017  . acetaminophen (TYLENOL) 325 MG tablet Take 650 mg by mouth every 6 (six) hours as needed for fever.   Unk  . collagenase (SANTYL) ointment Apply topically 2 (two) times daily. (Patient not taking: Reported on 06/26/2017) 15 g 0 Not Taking at Unknown time  . feeding supplement, GLUCERNA SHAKE, (GLUCERNA SHAKE) LIQD Take 237 mLs by mouth 2 (two) times daily between meals. (Patient not taking: Reported on 08/18/2017)  0 Not Taking at Unknown time  . glucagon (GLUCAGON EMERGENCY) 1 MG injection Inject 1 mg into the muscle once as needed (Hypoglycemia).   Unk  . HYDROmorphone (DILAUDID) 2 MG tablet Take 1 mg by mouth every 8 (eight) hours as needed for severe pain.   Unk  . loperamide (IMODIUM) 2 MG capsule Take 2 capsules (4 mg total) by mouth as needed for diarrhea or loose stools.   Unk  . magnesium hydroxide (MILK OF MAGNESIA) 400 MG/5ML suspension Take 30 mLs by mouth daily as needed for mild constipation. 360 mL 0 Unk  . meclizine (ANTIVERT) 12.5 MG tablet Take 12.5 mg by mouth every 8 (eight) hours as needed (vertigo).  Unk  . polyethylene glycol (MIRALAX /  GLYCOLAX) packet Take 17 g by mouth daily as needed for mild constipation, moderate constipation or severe constipation. 14 each 0 Unk   Anti-infectives (From admission, onward)   Start     Dose/Rate Route Frequency Ordered Stop   08/18/17 2200  linezolid (ZYVOX) IVPB 600 mg     600 mg 300 mL/hr over 60 Minutes Intravenous Every 12 hours 08/18/17 0914     08/18/17 1000  linezolid (ZYVOX) IVPB 600 mg     600 mg 300 mL/hr over 60 Minutes Intravenous  Once 08/18/17 0906 08/18/17 1148   08/18/17 1000  meropenem (MERREM) 1 g in sodium chloride 0.9 % 100 mL IVPB     1 g 200 mL/hr over 30 Minutes Intravenous Every 12 hours 08/18/17 0906     08/18/17 0800  levofloxacin (LEVAQUIN) IVPB 750 mg  Status:  Discontinued     750 mg 100 mL/hr over 90 Minutes Intravenous  Once 08/18/17 0753 08/18/17 0906   08/18/17 0800  aztreonam (AZACTAM) 2 g in sodium chloride 0.9 % 100 mL IVPB     2 g 200 mL/hr over 30 Minutes Intravenous  Once 08/18/17 0753 08/18/17 0929   08/18/17 0800  vancomycin (VANCOCIN) IVPB 1000 mg/200 mL premix  Status:  Discontinued     1,000 mg 200 mL/hr over 60 Minutes Intravenous  Once 08/18/17 0753 08/18/17 0906      Results for orders placed or performed during the hospital encounter of 08/18/17 (from the past 48 hour(s))  CBC with Differential     Status: Abnormal   Collection Time: 08/18/17  7:17 AM  Result Value Ref Range   WBC 5.1 4.0 - 10.5 K/uL   RBC 2.84 (L) 3.87 - 5.11 MIL/uL   Hemoglobin 7.8 (L) 12.0 - 15.0 g/dL   HCT 25.1 (L) 36.0 - 46.0 %   MCV 88.4 78.0 - 100.0 fL   MCH 27.5 26.0 - 34.0 pg   MCHC 31.1 30.0 - 36.0 g/dL   RDW 20.3 (H) 11.5 - 15.5 %   Platelets 65 (L) 150 - 400 K/uL    Comment: SPECIMEN CHECKED FOR CLOTS REPEATED TO VERIFY PLATELET COUNT CONFIRMED BY SMEAR    Neutrophils Relative % 61 %   Lymphocytes Relative 33 %   Monocytes Relative 4 %   Eosinophils Relative 2 %   Basophils Relative 0 %   Neutro Abs 3.1 1.7 - 7.7 K/uL   Lymphs Abs 1.7 0.7  - 4.0 K/uL   Monocytes Absolute 0.2 0.1 - 1.0 K/uL   Eosinophils Absolute 0.1 0.0 - 0.7 K/uL   Basophils Absolute 0.0 0.0 - 0.1 K/uL   RBC Morphology POLYCHROMASIA PRESENT     Comment: Performed at [facility], [address] [place], [place] [address]  Comprehensive metabolic panel     Status: Abnormal   Collection Time: 08/18/17  7:17 AM  Result Value Ref Range   Sodium 144 135 - 145 mmol/L   Potassium 3.3 (L) 3.5 - 5.1 mmol/L   Chloride 116 (H) 101 - 111 mmol/L   CO2 24 22 - 32 mmol/L   Glucose, Bld 62 (L) 65 - 99 mg/dL   BUN 24 (H) 6 - 20 mg/dL   Creatinine, Ser 1.72 (H) 0.44 - 1.00 mg/dL   Calcium 8.0 (L) 8.9 - 10.3 mg/dL   Total Protein 4.2 (L) 6.5 - 8.1 g/dL   Albumin 1.1 (L) 3.5 - 5.0 g/dL   AST 41  15 - 41 U/L   ALT 6 (L) 14 - 54 U/L   Alkaline Phosphatase 537 (H) 38 - 126 U/L   Total Bilirubin 0.3 0.3 - 1.2 mg/dL   GFR calc non Af Amer 34 (L) >60 mL/min   GFR calc Af Amer 39 (L) >60 mL/min    Comment: (NOTE) The eGFR has been calculated using the CKD EPI equation. This calculation has not been validated in all clinical situations. eGFR's persistently <60 mL/min signify possible Chronic Kidney Disease.    Anion gap 4 (L) 5 - 15    Comment: Performed at [facility] [address] [place], [place] [address]  Gamma GT     Status: Abnormal   Collection Time: 08/18/17  7:17 AM  Result Value Ref Range   GGT 131 (H) 7 - 50 U/L    Comment: Performed at [facility], [place] [address], [place], [place] [address]  CBG monitoring, ED     Status: Abnormal   Collection Time: 08/18/17  7:18 AM  Result Value Ref Range   Glucose-Capillary 53 (L) 65 - 99 mg/dL   Comment 1 Notify RN   CBG monitoring, ED     Status: Abnormal   Collection Time: 08/18/17  7:26 AM  Result Value Ref Range   Glucose-Capillary 132 (H) 65 - 99 mg/dL  I-stat troponin, ED     Status: None   Collection Time: 08/18/17  8:24 AM  Result Value Ref Range   Troponin i, poc 0.01 0.00 - 0.08  ng/mL   Comment 3            Comment: Due to the release kinetics of cTnI, a negative result within the first hours of the onset of symptoms does not rule out myocardial infarction with certainty. If myocardial infarction is still suspected, repeat the test at appropriate intervals.   I-Stat CG4 Lactic Acid, ED     Status: None   Collection Time: 08/18/17  8:26 AM  Result Value Ref Range   Lactic Acid, Venous 1.00 0.5 - 1.9 mmol/L  I-Stat Chem 8, ED     Status: Abnormal   Collection Time: 08/18/17  8:26 AM  Result Value Ref Range   Sodium 145 135 - 145 mmol/L   Potassium 3.1 (L) 3.5 - 5.1 mmol/L   Chloride 111 101 - 111 mmol/L   BUN 25 (H) 6 - 20 mg/dL   Creatinine, Ser 1.70 (H) 0.44 - 1.00 mg/dL   Glucose, Bld 246 (H) 65 - 99 mg/dL   Calcium, Ion 1.26 1.15 - 1.40 mmol/L   TCO2 22 22 - 32 mmol/L   Hemoglobin 7.8 (L) 12.0 - 15.0 g/dL   HCT 23.0 (L) 36.0 - 46.0 %  CBG monitoring, ED     Status: Abnormal   Collection Time: 08/18/17  9:41 AM  Result Value Ref Range   Glucose-Capillary 168 (H) 65 - 99 mg/dL  I-Stat CG4 Lactic Acid, ED     Status: None   Collection Time: 08/18/17 10:12 AM  Result Value Ref Range   Lactic Acid, Venous 0.60 0.5 - 1.9 mmol/L  CBG monitoring, ED     Status: Abnormal   Collection Time: 08/18/17 12:09 PM  Result Value Ref Range   Glucose-Capillary 138 (H) 65 - 99 mg/dL  Urinalysis, Routine w reflex microscopic     Status: Abnormal   Collection Time: 08/18/17  1:25 PM  Result Value Ref Range   Color, Urine YELLOW YELLOW  APPearance TURBID (A) CLEAR   Specific Gravity, Urine 1.009 1.005 - 1.030   pH 5.0 5.0 - 8.0   Glucose, UA 150 (A) NEGATIVE mg/dL   Hgb urine dipstick LARGE (A) NEGATIVE   Bilirubin Urine NEGATIVE NEGATIVE   Ketones, ur NEGATIVE NEGATIVE mg/dL   Protein, ur 100 (A) NEGATIVE mg/dL   Nitrite NEGATIVE NEGATIVE   Leukocytes, UA LARGE (A) NEGATIVE   RBC / HPF >50 (H) 0 - 5 RBC/hpf   WBC, UA >50 (H) 0 - 5 WBC/hpf   Bacteria, UA  NONE SEEN NONE SEEN   WBC Clumps PRESENT     Comment: Performed at [facility] [address] [place], [place] [address]  CBG monitoring, ED     Status: Abnormal   Collection Time: 08/18/17  3:16 PM  Result Value Ref Range   Glucose-Capillary 141 (H) 65 - 99 mg/dL   Comment 1 Notify RN    Comment 2 Document in Chart   Glucose, capillary     Status: Abnormal   Collection Time: 08/18/17  4:41 PM  Result Value Ref Range   Glucose-Capillary 119 (H) 65 - 99 mg/dL   Comment 1 Capillary Specimen    Comment 2 Notify RN   MRSA PCR Screening     Status: None   Collection Time: 08/18/17  4:49 PM  Result Value Ref Range   MRSA by PCR NEGATIVE NEGATIVE    Comment:        The GeneXpert MRSA Assay (FDA approved for NASAL specimens only), is one component of a comprehensive MRSA colonization surveillance program. It is not intended to diagnose MRSA infection nor to guide or monitor treatment for MRSA infections. Performed at [facility], [place] [address] [place], [place] [address]   Glucose, capillary     Status: None   Collection Time: 08/18/17  8:16 PM  Result Value Ref Range   Glucose-Capillary 97 65 - 99 mg/dL   Comment 1 Notify RN   Glucose, capillary     Status: None   Collection Time: 08/18/17 11:43 PM  Result Value Ref Range   Glucose-Capillary 98 65 - 99 mg/dL   Comment 1 Notify RN   Glucose, capillary     Status: None   Collection Time: 08/19/17  1:54 AM  Result Value Ref Range   Glucose-Capillary 87 65 - 99 mg/dL   Comment 1 Capillary Specimen   Comprehensive metabolic panel     Status: Abnormal   Collection Time: 08/19/17  3:34 AM  Result Value Ref Range   Sodium 145 135 - 145 mmol/L   Potassium 3.0 (L) 3.5 - 5.1 mmol/L   Chloride 121 (H) 101 - 111 mmol/L   CO2 19 (L) 22 - 32 mmol/L   Glucose, Bld 123 (H) 65 - 99 mg/dL   BUN 19 6 - 20 mg/dL   Creatinine, Ser 1.48 (H) 0.44 - 1.00 mg/dL   Calcium 6.8 (L) 8.9 - 10.3 mg/dL   Total Protein 4.0 (L) 6.5 -  8.1 g/dL   Albumin 1.0 (L) 3.5 - 5.0 g/dL   AST 29 15 - 41 U/L   ALT 6 (L) 14 - 54 U/L   Alkaline Phosphatase 454 (H) 38 - 126 U/L   Total Bilirubin 0.7 0.3 - 1.2 mg/dL   GFR calc non Af Amer 40 (L) >60 mL/min   GFR calc Af Amer 47 (L) >60 mL/min    Comment: (NOTE) The eGFR has been calculated using the  CKD EPI equation. This calculation has not been validated in all clinical situations. eGFR's persistently <60 mL/min signify possible Chronic Kidney Disease.    Anion gap 5 5 - 15    Comment: Performed at [facility] [address], [place], [place] [address]  CBC     Status: Abnormal   Collection Time: 08/19/17  3:34 AM  Result Value Ref Range   WBC 5.1 4.0 - 10.5 K/uL   RBC 2.42 (L) 3.87 - 5.11 MIL/uL   Hemoglobin 6.7 (LL) 12.0 - 15.0 g/dL    Comment: REPEATED TO VERIFY CRITICAL RESULT CALLED TO, READ BACK BY AND VERIFIED WITH: ROBIN ROBERTS RN AT 0848 08/19/17 BY WOOLLENK    HCT 20.8 (L) 36.0 - 46.0 %   MCV 86.0 78.0 - 100.0 fL   MCH 27.7 26.0 - 34.0 pg   MCHC 32.2 30.0 - 36.0 g/dL   RDW 20.2 (H) 11.5 - 15.5 %   Platelets 51 (L) 150 - 400 K/uL    Comment: REPEATED TO VERIFY SPECIMEN CHECKED FOR CLOTS PLATELET COUNT CONFIRMED BY SMEAR Performed at [facility], [place] [address] [place], [place] [address]   Glucose, capillary     Status: Abnormal   Collection Time: 08/19/17  4:15 AM  Result Value Ref Range   Glucose-Capillary 103 (H) 65 - 99 mg/dL   Comment 1 Notify RN   Cortisol     Status: None   Collection Time: 08/19/17  7:38 AM  Result Value Ref Range   Cortisol, Plasma 20.5 ug/dL    Comment: (NOTE) AM    6.7 - 22.6 ug/dL PM   <10.0       ug/dL Performed at [facility] [address], [place], [place] [address]   Lactic acid, plasma     Status: None   Collection Time: 08/19/17  7:38 AM  Result Value Ref Range   Lactic Acid, Venous 0.9 0.5 - 1.9 mmol/L    Comment: Performed at [facility] [address] [place], [place]  [address]  Glucose, capillary     Status: Abnormal   Collection Time: 08/19/17  8:33 AM  Result Value Ref Range   Glucose-Capillary 110 (H) 65 - 99 mg/dL   Comment 1 Notify RN   Type and screen [facility]     Status: None (Preliminary result)   Collection Time: 08/19/17  9:45 AM  Result Value Ref Range   ABO/RH(D) O POS    Antibody Screen NEG    Sample Expiration 08/22/2017    Unit Number [id number]    Blood Component Type RED CELLS,LR    Unit division 00    Status of Unit ISSUED    Transfusion Status OK TO TRANSFUSE    Crossmatch Result      Compatible Performed at [facility], [place] [address] [place], [place] [address]   Prepare RBC     Status: None   Collection Time: 08/19/17  9:46 AM  Result Value Ref Range   Order Confirmation      ORDER PROCESSED BY BLOOD BANK Performed at [facility], [place] [address] [place], [place] [address]   Glucose, capillary     Status: Abnormal   Collection Time: 08/19/17 12:00 PM  Result Value Ref Range   Glucose-Capillary 116 (H) 65 - 99 mg/dL   Comment 1 Notify RN     Dg Chest Port 1 View  Result Date: 08/19/2017 CLINICAL  DATA:  Follow-up pneumonia. EXAM: PORTABLE CHEST 1 VIEW COMPARISON:  08/18/2017 and older exams. FINDINGS: Left mid to lower lung opacity is similar to the previous day's exam. No new lung abnormalities. No evidence of pulmonary edema. No pneumothorax. Right sided PICC is stable. IMPRESSION: 1. Persistent left mid to lower lung opacity. This could reflect pneumonia. It may be due to a combination of pleural fluid and atelectasis. 2. No new lung abnormalities.  No pulmonary edema. Electronically Signed   By: Lajean Manes M.D.   On: 08/19/2017 09:15   Dg Chest Port 1 View  Result Date: 08/18/2017 CLINICAL DATA:  Weakness and hypotension. EXAM: PORTABLE CHEST 1 VIEW COMPARISON:  Chest x-ray dated Jul 19, 2017. FINDINGS: The patient is rotated to the left. Unchanged right upper extremity PICC line  with the tip in the SVC. Interval removal of the right internal jugular central venous catheter. Stable cardiomegaly. Increased retrocardiac density. The right lung is clear. No pleural effusion or pneumothorax. No acute osseous abnormality. IMPRESSION: 1. New left lower lobe consolidation, concerning for pneumonia. Electronically Signed   By: Titus Dubin M.D.   On: 08/18/2017 08:25    Review of Systems  Unable to perform ROS: Mental status change   Blood pressure 108/65, pulse 70, temperature (!) 97.3 F (36.3 C), temperature source Oral, resp. rate 10, height '5\' 3"'$  (1.6 m), weight 55.8 kg (123 lb 0.3 oz), SpO2 100 %. Physical Exam  Constitutional: No distress.  Thin black female in no acute distress, Hypothermic, with fluid warmer being used.  HENT:  Head: Normocephalic and atraumatic.  Mouth/Throat: No oropharyngeal exudate.  Eyes: Right eye exhibits no discharge. Left eye exhibits no discharge. No scleral icterus.  Pupils are equal  Neck: Normal range of motion. Neck supple. No JVD present. No tracheal deviation present. No thyromegaly present.  Cardiovascular: Normal rate, regular rhythm and intact distal pulses.  No murmur heard. Respiratory: Effort normal and breath sounds normal. No respiratory distress. She has no wheezes. She has no rales. She exhibits no tenderness.  GI: Soft. Bowel sounds are normal. She exhibits no distension and no mass. There is no tenderness. There is no rebound and no guarding.  Musculoskeletal: She exhibits no edema or tenderness.  Lymphadenopathy:    She has no cervical adenopathy.  Neurological: She is alert. No cranial nerve deficit.  She is trying to speak but I cannot understand what she is saying.  Skin: Skin is warm and dry. No rash noted. She is not diaphoretic. No erythema. No pallor.  6X9X1cm, bone visible, 40% slough/eschar, 60% red, mod amt tan drainage, no odor sacral ulcer.  See picture below  Psychiatric: She has a normal mood and  affect.    This eschar above is about 3 x 6 cm.  Full thickness.  I don't feel any air/crepitus around the site. Bone is palpated in the top of the site, ~ 3 o'clock on this picture.  Assessment/Plan: Sepsis/Hypotension Encephalopathy secondary to sepsis Hypothermia LLL pneumonia  Adrenal insuffiencey  Chronic UTI/Neurogenic bladder Hx of VRE/Proteus bacteremia /MRSA  Multiple sclerosis in SNF Diabetes type I Depression  Hx of CVA  Plan:  I don't think this caused her sepsis, but it's a large full thickness skin loss.  Once she is stable we wound consider debridement of the eschar.  Continue santyl and local wound care for now.  ID to help with osteomyelitis.       Debra [name] 08/19/2017, 2:05 PM

## 2017-08-19 NOTE — Progress Notes (Signed)
eLink Physician-Brief Progress Note Patient Name: Debra [name] DOB: March 21, 1965 MRN: [id number]   Date of Service  08/19/2017  HPI/Events of Note  Hyperglycemia - Blood glucose = 189.   eICU Interventions  Will order: 1. Q 4 hour sensitive Novolog SSI.      Intervention Category Major Interventions: Hyperglycemia - active titration of insulin therapy  Lenell Antu 08/19/2017, 8:29 PM

## 2017-08-20 ENCOUNTER — Inpatient Hospital Stay (HOSPITAL_COMMUNITY): Payer: Medicaid Other

## 2017-08-20 DIAGNOSIS — M25552 Pain in left hip: Secondary | ICD-10-CM

## 2017-08-20 DIAGNOSIS — Z7401 Bed confinement status: Secondary | ICD-10-CM

## 2017-08-20 DIAGNOSIS — M25551 Pain in right hip: Secondary | ICD-10-CM

## 2017-08-20 DIAGNOSIS — R32 Unspecified urinary incontinence: Secondary | ICD-10-CM

## 2017-08-20 DIAGNOSIS — Z79891 Long term (current) use of opiate analgesic: Secondary | ICD-10-CM

## 2017-08-20 DIAGNOSIS — R1310 Dysphagia, unspecified: Secondary | ICD-10-CM

## 2017-08-20 DIAGNOSIS — D649 Anemia, unspecified: Secondary | ICD-10-CM

## 2017-08-20 DIAGNOSIS — G8929 Other chronic pain: Secondary | ICD-10-CM

## 2017-08-20 DIAGNOSIS — L89159 Pressure ulcer of sacral region, unspecified stage: Secondary | ICD-10-CM

## 2017-08-20 DIAGNOSIS — Z79899 Other long term (current) drug therapy: Secondary | ICD-10-CM

## 2017-08-20 DIAGNOSIS — Z9889 Other specified postprocedural states: Secondary | ICD-10-CM

## 2017-08-20 LAB — LACTIC ACID, PLASMA: Lactic Acid, Venous: 0.9 mmol/L (ref 0.5–1.9)

## 2017-08-20 LAB — TYPE AND SCREEN
ABO/RH(D): O POS
Antibody Screen: NEGATIVE
Unit division: 0

## 2017-08-20 LAB — CBC
HEMATOCRIT: 24.6 % — AB (ref 36.0–46.0)
Hemoglobin: 7.9 g/dL — ABNORMAL LOW (ref 12.0–15.0)
MCH: 27.8 pg (ref 26.0–34.0)
MCHC: 32.1 g/dL (ref 30.0–36.0)
MCV: 86.6 fL (ref 78.0–100.0)
PLATELETS: 51 10*3/uL — AB (ref 150–400)
RBC: 2.84 MIL/uL — ABNORMAL LOW (ref 3.87–5.11)
RDW: 18.2 % — AB (ref 11.5–15.5)
WBC: 5.2 10*3/uL (ref 4.0–10.5)

## 2017-08-20 LAB — GLUCOSE, CAPILLARY
GLUCOSE-CAPILLARY: 86 mg/dL (ref 65–99)
GLUCOSE-CAPILLARY: 97 mg/dL (ref 65–99)
GLUCOSE-CAPILLARY: 123 mg/dL — AB (ref 65–99)
Glucose-Capillary: 115 mg/dL — ABNORMAL HIGH (ref 65–99)
Glucose-Capillary: 115 mg/dL — ABNORMAL HIGH (ref 65–99)
Glucose-Capillary: 165 mg/dL — ABNORMAL HIGH (ref 65–99)

## 2017-08-20 LAB — BASIC METABOLIC PANEL
ANION GAP: 3 — AB (ref 5–15)
BUN: 17 mg/dL (ref 6–20)
CHLORIDE: 126 mmol/L — AB (ref 101–111)
CO2: 17 mmol/L — AB (ref 22–32)
Calcium: 6.3 mg/dL — CL (ref 8.9–10.3)
Creatinine, Ser: 1.27 mg/dL — ABNORMAL HIGH (ref 0.44–1.00)
GFR calc Af Amer: 56 mL/min — ABNORMAL LOW (ref >60)
GFR, EST NON AFRICAN AMERICAN: 48 mL/min — AB (ref >60)
Glucose, Bld: 112 mg/dL — ABNORMAL HIGH (ref 65–99)
POTASSIUM: 3.2 mmol/L — AB (ref 3.5–5.1)
Sodium: 146 mmol/L — ABNORMAL HIGH (ref 135–145)

## 2017-08-20 LAB — BPAM RBC
BLOOD PRODUCT EXPIRATION DATE: 201907212359
ISSUE DATE / TIME: 201906231401
UNIT TYPE AND RH: 5100

## 2017-08-20 MED ORDER — HEPARIN SODIUM (PORCINE) 5000 UNIT/ML IJ SOLN
5000.0000 [IU] | Freq: Three times a day (TID) | INTRAMUSCULAR | Status: DC
  Administered 2017-08-20 – 2017-08-24 (×12): 5000 [IU] via SUBCUTANEOUS
  Filled 2017-08-20 (×10): qty 1

## 2017-08-20 MED ORDER — CHLORHEXIDINE GLUCONATE CLOTH 2 % EX PADS
6.0000 | MEDICATED_PAD | Freq: Every day | CUTANEOUS | Status: AC
  Administered 2017-08-22 – 2017-08-25 (×4): 6 via TOPICAL

## 2017-08-20 MED ORDER — CHLORHEXIDINE GLUCONATE CLOTH 2 % EX PADS: 6.0000 | MEDICATED_PAD | Freq: Every day | CUTANEOUS | Status: DC

## 2017-08-20 MED ORDER — FAMOTIDINE 20 MG PO TABS
20.0000 mg | ORAL_TABLET | Freq: Every day | ORAL | Status: DC
  Administered 2017-08-22 – 2017-08-28 (×5): 20 mg via ORAL
  Filled 2017-08-20 (×8): qty 1

## 2017-08-20 MED ORDER — OXYCODONE HCL 5 MG PO TABS
5.0000 mg | ORAL_TABLET | Freq: Four times a day (QID) | ORAL | Status: DC | PRN
  Administered 2017-08-29 (×2): 5 mg via ORAL
  Filled 2017-08-20 (×2): qty 1

## 2017-08-20 MED ORDER — POTASSIUM CHLORIDE 10 MEQ/50ML IV SOLN
10.0000 meq | INTRAVENOUS | Status: DC
  Administered 2017-08-20 (×4): 10 meq via INTRAVENOUS
  Filled 2017-08-20 (×4): qty 50

## 2017-08-20 MED ORDER — HYDROMORPHONE HCL 1 MG/ML IJ SOLN
1.0000 mg | INTRAMUSCULAR | Status: DC | PRN
  Administered 2017-08-22 – 2017-09-04 (×5): 1 mg via INTRAVENOUS
  Filled 2017-08-20 (×6): qty 1

## 2017-08-20 MED ORDER — MUPIROCIN 2 % EX OINT
1.0000 "application " | TOPICAL_OINTMENT | Freq: Two times a day (BID) | CUTANEOUS | Status: AC
  Administered 2017-08-20 – 2017-08-24 (×9): 1 via NASAL
  Filled 2017-08-20 (×3): qty 22

## 2017-08-20 MED ORDER — INSULIN GLARGINE 100 UNIT/ML ~~LOC~~ SOLN
3.0000 [IU] | Freq: Every day | SUBCUTANEOUS | Status: DC
  Administered 2017-08-20 – 2017-08-25 (×6): 3 [IU] via SUBCUTANEOUS
  Filled 2017-08-20 (×8): qty 0.03

## 2017-08-20 MED ORDER — SODIUM CHLORIDE 0.9 % IV SOLN
1.0000 g | Freq: Once | INTRAVENOUS | Status: AC
  Administered 2017-08-20: 1 g via INTRAVENOUS
  Filled 2017-08-20: qty 10

## 2017-08-20 MED ORDER — HYDRALAZINE HCL 20 MG/ML IJ SOLN
10.0000 mg | INTRAMUSCULAR | Status: DC | PRN
  Administered 2017-08-20 – 2017-08-29 (×5): 10 mg via INTRAVENOUS
  Filled 2017-08-20 (×5): qty 1

## 2017-08-20 MED ORDER — CHLORHEXIDINE GLUCONATE 0.12 % MT SOLN
15.0000 mL | Freq: Two times a day (BID) | OROMUCOSAL | Status: DC
  Administered 2017-08-20 – 2017-09-07 (×17): 15 mL via OROMUCOSAL
  Filled 2017-08-20 (×30): qty 15

## 2017-08-20 MED ORDER — ORAL CARE MOUTH RINSE
15.0000 mL | Freq: Two times a day (BID) | OROMUCOSAL | Status: DC
  Administered 2017-08-20 – 2017-09-07 (×17): 15 mL via OROMUCOSAL

## 2017-08-20 NOTE — Progress Notes (Signed)
Patient ID: Debra [name], female   DOB: May 30, 1965, 52 y.o.   MRN: [id number]  Patient improving, planning transfer out to floor Still being treated for LLL pneumonia Will plan surgical debridement later this week  Wilmon Arms. Corliss Skains, MD, [facility]  General/ Trauma Surgery  08/20/2017 8:21 AM

## 2017-08-20 NOTE — Progress Notes (Signed)
Debra [name] is a 51 y.o. female patient admitted from ED awake, alert - oriented  X 4 - no acute distress noted.  VSS - Blood pressure (!) 148/79, pulse 69, temperature (!) 97.5 F (36.4 C), temperature source Axillary, resp. rate 13, height 5\' 3"  (1.6 m), weight 57.5 kg (126 lb 12.2 oz), SpO2 100 %.    IV in place, occlusive dsg intact without redness.  Orientation to room, and floor completed with information packet given to patient/family.  Patient declined safety video at this time.  Admission INP armband ID verified with patient/family, and in place.   SR up x 2, fall assessment complete, with patient and family able to verbalize understanding of risk associated with falls, and verbalized understanding to call nsg before up out of bed.  Call light within reach, patient able to voice, and demonstrate understanding.  Skin, clean-dry- intact without evidence of bruising, or skin tears.   No evidence of skin break down noted on exam.     Will cont to eval and treat per MD orders.  Marca Ancona, RN 08/20/2017 3:54 PM

## 2017-08-20 NOTE — Progress Notes (Signed)
eLink Physician-Brief Progress Note Patient Name: Debra [name] DOB: 05/03/1965 MRN: [id number]   Date of Service  08/20/2017  HPI/Events of Note  Hypertension - BP = 182/93.  eICU Interventions  Will order: 1. Hydralazine 10 mg IV Q 4 hours PRN SBP > 170 or DBP > 100.     Intervention Category Major Interventions: Hypertension - evaluation and management  [name] 08/20/2017, 2:09 AM

## 2017-08-20 NOTE — Progress Notes (Signed)
  Speech Language Pathology Treatment: Dysphagia  Patient Details Name: Debra [name] MRN: [id number] DOB: Mar 08, 1965 Today's Date: 08/20/2017 Time: 0950-1006 SLP Time Calculation (min) (ACUTE ONLY): 16 min  Assessment / Plan / Recommendation Clinical Impression  F/u diet tolerance assessment complete after initial evaluation. Patient lethargic but arousable, requiring moderate cues to sustain attention to task and verbally engage with patient. Vocal quality clear but with decreased intensity. Per RN, not overt s/s of aspiration indicated with pos however patient continues to have difficulty clearing lung congestion. Patient able to consume po trials with max physical assistance with feeding, no overt signs of aspiration. Oral phase prolonged with trials of pureed solids with periods of oral holding (patient reporting dislike) but no indication of decreased airway protection. For nutritional purposes, will advance diet to pureed solids and thin liquids however suspect that intake will be limited. MD, please consider nutritional consult. SLP will continue to f/u for diet tolerance given generalized gross weakness may increase aspiration risk.    HPI HPI: 52 yo female with MS, neurogenic bladder w/ chronic foley , chronic ulcers /osteomyelitis admitted with Sepsis ?PNA 6/22. CXR 08/18/17 showed new left lower lobe consolidation, concerning for pneumonia. Seen by ST during prior admissions in Feb 2019 and most recently May 2019, followed briefly due to reported difficulty with solids, pocketing foods. Advanced to regular/thin prior to d/c.       SLP Plan  Continue with current plan of care       Recommendations  Diet recommendations: Dysphagia 1 (puree);Thin liquid Liquids provided via: Cup;Straw Medication Administration: Whole meds with liquid Supervision: Staff to assist with self feeding;Full supervision/cueing for compensatory strategies Compensations: Slow rate;Small  sips/bites Postural Changes and/or Swallow Maneuvers: Seated upright 90 degrees                Oral Care Recommendations: Oral care BID Follow up Recommendations: Skilled Nursing facility SLP Visit Diagnosis: Dysphagia, oral phase (R13.11) Plan: Continue with current plan of care       [facility] MA, CCC-SLP [phone]                [name] [name] 08/20/2017, 10:09 AM

## 2017-08-20 NOTE — Progress Notes (Signed)
Transfer:  Ms. Baz is a 52 yo female with a complicated medical history including MS complicated by urinary and bowel continence with a chronic indwelling foley catheter, chronic sacral ulcer and sacral osteomyelitis, and recurrent admissions for urosepsis and DKA who presented in septic shock secondary to a RML pneumonia and required admission to the ICU for pressor support for 24h. She was not intubated. Currently on linezolid and meropenem due to history of VRE and ESBL Klebsiella. In the ICU she was noted to have a drop in Hgb to 6.4 and required 1 unit of blood. No signs of bleeding noted. Noted to be anuric on admission, now with adequate UOP.   Subjective:  Ms. Jablonowski is now afebrile and hemodynamically stable. She has been off pressors for 24 hours and maintaining BP and MAPs>65. She arouses to voice,is able to follow commands and answers questions appropriately. Her only compliant is bilateral hip pain which is chronic.   Objective:  Vital signs in last 24 hours: Vitals:   08/20/17 0700 08/20/17 0747 08/20/17 0800 08/20/17 0900  BP: 131/67  121/62 129/65  Pulse: 76 77 77 75  Resp: 12 12 (!) 9 12  Temp:  98.3 F (36.8 C)    TempSrc:  Oral    SpO2: 100% 100% 99% 100%  Weight:      Height:       Physical Exam  Constitutional:  Thin, chronically ill appearing female sleeping in bed in no acute distress   HENT:  Head: Normocephalic and atraumatic.  Eyes: Conjunctivae are normal.  Cardiovascular: Normal rate and regular rhythm. Exam reveals no gallop and no friction rub.  Murmur (II/VI SEM unchanged) heard. Pulmonary/Chest: Effort normal. No respiratory distress.  Abdominal: Soft. Bowel sounds are normal. She exhibits no distension. There is no tenderness.  Musculoskeletal: She exhibits no edema.  Neurological: She is alert.  Somnolent but Arouses to voice, able to follow commands, and answering questions appropriately     Assessment/Plan:  Active Problems:   Sepsis  (HCC)  # Bilateral pneumonia: Patient presented in septic shock requiring admission to the ICU for pressor support. Now off pressors x 24h and hemodynamically stable. Denies shortness of breath and no increased WOB on room air. CXR today with bilateral opacities  Will continue antibiotic therapy with Linezolid and meropenem given history of VRE and ESBL Klebsiella. She did present with a new thrombocytopenia which I suspect is secondary to sepsis, but will continue to monitor platelet count as she is on Linezolid.  - Continue Linezolid and meropenem x 7 days. Currently on Day 3 - CBC daily  - Will need repeat CXR in 6 weeks   # Chronic sacral ulcer and osteomyelitis: Patient noted have significant eschar on chronic sacral ulcer on admission. WOC evaluated patient and recommended surgical consult. General surgery consulted and planning on debridement when patient is medically stable.  - General surgery following, appreciate assistance   # CKD stage II: Patient presented with acute renal failure and anuria. Now with adequate UOP likely multifactorial from ATN in the setting of septic shock and pre-renal from poor PO intake.  - Strict I/Os  - Continue to monitor renal function   # T1DM: Patient has a history of labile CBGs due to poor PO intake. Currently on dysphagia 1 diet after SLP evaluation. It appears from discussion in the past patient has considered a PEG tube given her long history of poor PO intake, but this was not addressed with patient today. Will start  long acting insulin as she is a T2 diabetic and continue to monitor CBG closely.  - Lantus 3 units QHS  - SSI-S + CBG monitoring   # MS: Patient with severe bilateral LE weakness and bed bound at baseline. She is also incontinent of urine and bowel and has a chronic indwelling foley catheter.  - Continue home baclofen 5 mg TID  - Dilaudid 1 mg q4h PRN + oxycodone 10 mg q6h PRN for chronic pain   HCPOA: [name] [phone]. She  confirmed patient is full code on admission.   Dispo: Anticipated discharge in approximately 4-6 day(s) pending completion of antibiotic therapy.   [name], MD 08/20/2017, 10:23 AM Pager: [phone]

## 2017-08-20 NOTE — Progress Notes (Signed)
Called Aunt and informed her of patient's transfer to [phone]. Updated on patient's status. Tamsen Meek, RN 08/20/2017 3:42 PM

## 2017-08-20 NOTE — Progress Notes (Signed)
K+ = 3.2, eLink Physician-Brief Progress Note Patient Name: Debra [name] DOB: [birth year] MRN: [id number]   Date of Service  08/20/2017  HPI/Events of Note  K+ = 3.2, Ca++ = 6.3 and Creatinine = 1.27.  eICU Interventions  Will replace K+ and Ca++.     Intervention Category Major Interventions: Electrolyte abnormality - evaluation and management  [name] 08/20/2017, 5:48 AM

## 2017-08-20 NOTE — Progress Notes (Signed)
Nutrition Brief Note  Consult received for pt with poor PO intake.  Pt being transferred during visit. Unable to see pt at this time.    Wt Readings from Last 15 Encounters:  08/20/17 126 lb 15.8 oz (57.6 kg)  07/23/17 144 lb 13.5 oz (65.7 kg)  07/05/17 122 lb 9.2 oz (55.6 kg)  05/21/17 126 lb 15.8 oz (57.6 kg)  04/23/17 156 lb 8.4 oz (71 kg)  03/15/17 143 lb 11.8 oz (65.2 kg)  02/08/17 164 lb 14.5 oz (74.8 kg)  01/01/17 161 lb 14.4 oz (73.4 kg)  12/20/16 150 lb (68 kg)  12/13/16 168 lb 4.7 oz (76.3 kg)  11/18/16 167 lb 15.9 oz (76.2 kg)  11/07/16 150 lb 12.8 oz (68.4 kg)  10/31/16 153 lb 4.8 oz (69.5 kg)  02/24/16 169 lb 5 oz (76.8 kg)  05/05/15 181 lb 8 oz (82.3 kg)    Body mass index is 22.49 kg/m.   Diet advanced to Dysphagia 1 with thin liquids this am. Labs and medications reviewed.    Kendell Bane RD, LDN, CNSC [phone] Pager [phone] After Hours Pager

## 2017-08-21 DIAGNOSIS — R680 Hypothermia, not associated with low environmental temperature: Secondary | ICD-10-CM

## 2017-08-21 DIAGNOSIS — J181 Lobar pneumonia, unspecified organism: Secondary | ICD-10-CM

## 2017-08-21 LAB — BASIC METABOLIC PANEL
Anion gap: 4 — ABNORMAL LOW (ref 5–15)
BUN: 14 mg/dL (ref 6–20)
CO2: 18 mmol/L — ABNORMAL LOW (ref 22–32)
CREATININE: 1.31 mg/dL — AB (ref 0.44–1.00)
Calcium: 7.2 mg/dL — ABNORMAL LOW (ref 8.9–10.3)
Chloride: 127 mmol/L — ABNORMAL HIGH (ref 98–111)
GFR calc Af Amer: 54 mL/min — ABNORMAL LOW (ref >60)
GFR, EST NON AFRICAN AMERICAN: 47 mL/min — AB (ref >60)
GLUCOSE: 80 mg/dL (ref 70–99)
POTASSIUM: 4.2 mmol/L (ref 3.5–5.1)
SODIUM: 149 mmol/L — AB (ref 135–145)

## 2017-08-21 LAB — CBC
HCT: 26.6 % — ABNORMAL LOW (ref 36.0–46.0)
Hemoglobin: 8.4 g/dL — ABNORMAL LOW (ref 12.0–15.0)
MCH: 27.6 pg (ref 26.0–34.0)
MCHC: 31.6 g/dL (ref 30.0–36.0)
MCV: 87.5 fL (ref 78.0–100.0)
Platelets: 44 10*3/uL — ABNORMAL LOW (ref 150–400)
RBC: 3.04 MIL/uL — AB (ref 3.87–5.11)
RDW: 18.8 % — ABNORMAL HIGH (ref 11.5–15.5)
WBC: 4.8 10*3/uL (ref 4.0–10.5)

## 2017-08-21 LAB — LACTIC ACID, PLASMA: Lactic Acid, Venous: 0.8 mmol/L (ref 0.5–1.9)

## 2017-08-21 LAB — TSH: TSH: 6.263 u[IU]/mL — AB (ref 0.350–4.500)

## 2017-08-21 LAB — GLUCOSE, CAPILLARY
GLUCOSE-CAPILLARY: 101 mg/dL — AB (ref 70–99)
Glucose-Capillary: 80 mg/dL (ref 70–99)
Glucose-Capillary: 78 mg/dL (ref 70–99)
Glucose-Capillary: 63 mg/dL — ABNORMAL LOW (ref 70–99)
Glucose-Capillary: 123 mg/dL — ABNORMAL HIGH (ref 70–99)
Glucose-Capillary: 130 mg/dL — ABNORMAL HIGH (ref 70–99)
Glucose-Capillary: 150 mg/dL — ABNORMAL HIGH (ref 70–99)

## 2017-08-21 MED ORDER — INSULIN ASPART 100 UNIT/ML ~~LOC~~ SOLN
0.0000 [IU] | Freq: Three times a day (TID) | SUBCUTANEOUS | Status: DC
  Administered 2017-08-22: 2 [IU] via SUBCUTANEOUS
  Administered 2017-08-22: 1 [IU] via SUBCUTANEOUS
  Administered 2017-08-22: 2 [IU] via SUBCUTANEOUS
  Administered 2017-08-24: 5 [IU] via SUBCUTANEOUS
  Administered 2017-08-24: 3 [IU] via SUBCUTANEOUS
  Administered 2017-08-24: 2 [IU] via SUBCUTANEOUS
  Administered 2017-08-27: 1 [IU] via SUBCUTANEOUS

## 2017-08-21 MED ORDER — ENSURE ENLIVE PO LIQD
237.0000 mL | Freq: Three times a day (TID) | ORAL | Status: DC
  Administered 2017-08-22 – 2017-08-23 (×3): 237 mL via ORAL

## 2017-08-21 MED ORDER — LISINOPRIL 20 MG PO TABS: 20.0000 mg | ORAL_TABLET | Freq: Every day | ORAL | Status: DC

## 2017-08-21 MED ORDER — DEXTROSE 50 % IV SOLN
INTRAVENOUS | Status: AC
  Administered 2017-08-21: 25 mL
  Filled 2017-08-21: qty 50

## 2017-08-21 MED ORDER — JUVEN PO PACK
1.0000 | PACK | Freq: Two times a day (BID) | ORAL | Status: DC
  Administered 2017-08-22 – 2017-09-04 (×4): 1 via ORAL
  Filled 2017-08-21 (×35): qty 1

## 2017-08-21 MED ORDER — DEXTROSE 5 % IV SOLN
INTRAVENOUS | Status: DC
  Administered 2017-08-21: 1000 mL via INTRAVENOUS
  Administered 2017-08-21 – 2017-08-22 (×3): via INTRAVENOUS

## 2017-08-21 MED ORDER — AMLODIPINE BESYLATE 10 MG PO TABS: 10.0000 mg | ORAL_TABLET | Freq: Every day | ORAL | Status: DC

## 2017-08-21 NOTE — Progress Notes (Signed)
ANTIBIOTIC CONSULT NOTE   Pharmacy Consult for Zyvox, Merrem Indication: sepsis, PNA  Allergies  Allergen Reactions  . Penicillins Anaphylaxis, Nausea And Vomiting and Rash    Has patient had a PCN reaction causing immediate rash, facial/tongue/throat swelling, SOB or lightheadedness with hypotension: Yes Has patient had a PCN reaction causing severe rash involving mucus membranes or skin necrosis: Yes Has patient had a PCN reaction that required hospitalization No Has patient had a PCN reaction occurring within the last 10 years: Yes If all of the above answers are "NO", then may proceed with Cephalosporin use.   . Lactose Intolerance (Gi) Other (See Comments)  . Pollen Extract Other (See Comments)    Seasonal Allergies  . Tape Rash    Patient Measurements: Height: 5\' 3"  (160 cm) Weight: 126 lb 15.8 oz (57.6 kg) IBW/kg (Calculated) : 52.4 Adjusted Body Weight:    Vital Signs: Temp: 93.2 F (34 C) (06/25 0915) Temp Source: Rectal (06/25 0915) BP: 165/83 (06/25 0628) Pulse Rate: 67 (06/25 0755) Intake/Output from previous day: 06/24 0701 - 06/25 0700 In: 1750.9 [I.V.:1205.3; IV Piggyback:535.6] Out: 512.5 [Urine:512.5] Intake/Output from this shift: Total I/O In: 0  Out: 350 [Urine:350]  Labs: Recent Labs    08/19/17 0334 08/19/17 1845 08/20/17 0432 08/21/17 0908  WBC 5.1 5.4 5.2 4.8  HGB 6.7* 8.7* 7.9* 8.4*  PLT 51* 54* 51* 44*  CREATININE 1.48*  --  1.27* 1.31*   Estimated Creatinine Clearance: 42.5 mL/min (A) (by C-G formula based on SCr of 1.31 mg/dL (H)). No results for input(s): VANCOTROUGH, VANCOPEAK, VANCORANDOM, GENTTROUGH, GENTPEAK, GENTRANDOM, TOBRATROUGH, TOBRAPEAK, TOBRARND, AMIKACINPEAK, AMIKACINTROU, AMIKACIN in the last 72 hours.   Microbiology:   Medical History: Past Medical History:  Diagnosis Date  . Adenomatous colonic polyps   . Anemia    2005  . Anxiety    1990  . Arthritis    "knees, hands" (05/07/2017)  . Asthma    2000  .  Benign essential HTN   . Cataract   . Chronic lower back pain   . Decubital ulcer   . Depression with psychotic symptoms   . Diabetic ketoacidosis without coma associated with type 1 diabetes mellitus (HCC)   . Difficult intubation    narrow airway  . Fibromyalgia 11/07/2016  . Gastroesophageal Reflux Disease (GERD)   . Heart murmur    Birth  . Hyperlipidemia    2005  . Hypertension    1998  . Internal hemorrhoids 04/24/06   on colonoscopy  . Migraine    "used to have them all the time; now maybe q6 months" (05/07/2017)  . Multiple sclerosis (HCC)   . Neuropathic bladder   . Neuropathy of the hands & feet   . Restless Leg Syndrome   . Sepsis due to urinary tract infection (HCC) 04/12/2017  . Sleep paralysis   . Stable angina pectoris    2007: cath showing normal cors.   . Stroke 1990   denies residual on 05/07/2017  . Type I Diabetes Mellitus 1988    Assessment:  ID:  Merrem/Linezolid D#4/7 for sepsis: LLL pneumonia, pressure decub ulcer, likely osteomyelitis, WBC WNL and temp this AM 92.9 (bear hugger), Scr 1.31 watch - h/o VRE - Chronic sacral ulcer and osteomyelitis:: debride when medically stable  Previous Antimicrobials: Linezolid 5/20 >>5/22 Merrem 5/20 >> 6/16 Flucon 5/21>>5/28  Doxycycline 6/17 >>   Antimicrobials this admission: aztreonam 6/22 x1 levofloxacin 6/22 x1 linezolid 6/22 >> Meropenem 6/22 >>   Microbiology results: 6/22 BCx: >>  6/22 MRSA PCR Negative   Goal of Therapy:  Eradication of infection  Plan:  Linezolid 600 mg IV q12h Meropenem 1 g IV q12h F/u platelets on Zyvox and heparin   Diaz Crago S. Merilynn Finland, PharmD, BCPS Clinical Staff Pharmacist Pager [phone]  Misty Stanley Stillinger 08/21/2017,10:56 AM

## 2017-08-21 NOTE — Progress Notes (Signed)
Physical Therapy Wound Evaluation/Treatment Patient Details  Name: Debra [name] MRN: [id number] Date of Birth: February 22, 1966  Today's Date: 08/21/2017 Time: 1112-1205 Time Calculation (min): 53 min  Subjective  Subjective: Pt nods head "yes" in agreement to hydrotherapy.  Patient and Family Stated Goals: None stated  Pain Score:  Pt asleep during session. Appeared to tolerate well.   Wound Assessment  Pressure Injury 05/07/17 Stage IV - Full thickness tissue loss with exposed bone, tendon or muscle. Sacral Wound (Active)  Wound Image   08/21/2017  2:59 PM  Dressing Type Barrier Film (skin prep);Gauze (Comment);Moist to dry 08/21/2017  2:59 PM  Dressing Changed;Clean;Dry;Intact 08/21/2017  2:59 PM  Dressing Change Frequency Daily 08/21/2017  2:59 PM  State of Healing Eschar 08/21/2017  2:59 PM  Site / Wound Assessment Red;Black;Yellow 08/21/2017  2:59 PM  % Wound base Red or Granulating 40% 08/21/2017  2:59 PM  % Wound base Yellow/Fibrinous Exudate 10% 08/21/2017  2:59 PM  % Wound base Black/Eschar 50% 08/21/2017  2:59 PM  Peri-wound Assessment Intact 08/21/2017  2:59 PM  Wound Length (cm) 10 cm 08/21/2017  2:59 PM  Wound Width (cm) 10.3 cm 08/21/2017  2:59 PM  Wound Depth (cm) 1.9 cm 08/21/2017  2:59 PM  Wound Surface Area (cm^2) 103 cm^2 08/21/2017  2:59 PM  Wound Volume (cm^3) 195.7 cm^3 08/21/2017  2:59 PM  Undermining (cm) 1.3 cm from 8:00-11:00, 1:00-2:00 08/21/2017  2:59 PM  Margins Unattached edges (unapproximated) 08/21/2017  2:59 PM  Drainage Amount Moderate 08/21/2017  2:59 PM  Drainage Description Serosanguineous 08/21/2017  2:59 PM  Treatment Debridement (Selective);Hydrotherapy (Pulse lavage);Packing (Saline gauze) 08/21/2017  2:59 PM   Santyl applied to wound bed prior to applying dressing.    Pressure Injury 08/19/17 Unstageable - Full thickness tissue loss in which the base of the ulcer is covered by slough (yellow, tan, gray, green or brown) and/or eschar (tan, brown or black) in  the wound bed. (Active)  Wound Image   08/21/2017  2:59 PM  Dressing Type Foam 08/21/2017  2:59 PM  Dressing Changed;Clean;Dry;Intact 08/21/2017  2:59 PM  Dressing Change Frequency Daily 08/21/2017  2:59 PM  State of Healing Eschar 08/21/2017  2:59 PM  Site / Wound Assessment Black 08/21/2017  2:59 PM  % Wound base Red or Granulating 0% 08/21/2017  2:59 PM  % Wound base Yellow/Fibrinous Exudate 0% 08/21/2017  2:59 PM  % Wound base Black/Eschar 100% 08/21/2017  2:59 PM  Peri-wound Assessment Intact 08/21/2017  2:59 PM  Wound Length (cm) 0.5 cm 08/21/2017  2:59 PM  Wound Width (cm) 0.5 cm 08/21/2017  2:59 PM  Wound Depth (cm) 0.1 cm 08/21/2017  2:59 PM  Wound Surface Area (cm^2) 0.25 cm^2 08/21/2017  2:59 PM  Wound Volume (cm^3) 0.02 cm^3 08/21/2017  2:59 PM  Tunneling (cm) 0 08/21/2017  2:59 PM  Undermining (cm) 0 08/21/2017  2:59 PM  Margins Unattached edges (unapproximated) 08/21/2017  2:59 PM  Drainage Amount Moderate 08/21/2017  2:59 PM  Drainage Description Serosanguineous 08/21/2017  2:59 PM  Treatment Debridement (Selective);Hydrotherapy (Pulse lavage) 08/21/2017  2:59 PM   Santyl applied to wound bed prior to applying dressing.     Hydrotherapy Pulsed lavage therapy - wound location: Sacrum and ischium Pulsed Lavage with Suction (psi): 8 psi Pulsed Lavage with Suction - Normal Saline Used: 1000 mL Pulsed Lavage Tip: Tip with splash shield Selective Debridement Selective Debridement - Location: Sacrum and Ischium Selective Debridement - Tools Used: Forceps;Scalpel;Scissors Selective Debridement - Tissue Removed: Black  eschar and yellow necrotic tissue   Wound Assessment and Plan  Wound Therapy - Assess/Plan/Recommendations Wound Therapy - Clinical Statement: Pt presents to hydrotherapy with unstagable sacral and ischial pressure injuries. Hard black eschar mostly all removed this session however a significant amount of necrotic tissue likely remains underneath. Pt will benefit from continued  hydrotherapy for selective debridement of unviable tissue and to promote wound bed healing.  Wound Therapy - Functional Problem List: Decreased tolerance for positional changes Factors Delaying/Impairing Wound Healing: Altered sensation;Diabetes Mellitus;Incontinence;Infection - systemic/local;Immobility;Multiple medical problems;Polypharmacy Hydrotherapy Plan: Debridement;Dressing change;Patient/family education;Pulsatile lavage with suction Wound Therapy - Frequency: 6X / week Wound Therapy - Follow Up Recommendations: Skilled nursing facility Wound Plan: See above  Wound Therapy Goals- Improve the function of patient's integumentary system by progressing the wound(s) through the phases of wound healing (inflammation - proliferation - remodeling) by: Decrease Necrotic Tissue to: 0% Decrease Necrotic Tissue - Progress: Goal set today Increase Granulation Tissue to: 100% Increase Granulation Tissue - Progress: Goal set today Goals/treatment plan/discharge plan were made with and agreed upon by patient/family: Yes Time For Goal Achievement: 7 days Wound Therapy - Potential for Goals: Good  Goals will be updated until maximal potential achieved or discharge criteria met.  Discharge criteria: when goals achieved, discharge from hospital, MD decision/surgical intervention, no progress towards goals, refusal/missing three consecutive treatments without notification or medical reason.  GP     Thelma Comp 08/21/2017, 3:17 PM   Rolinda Roan, PT, DPT Acute Rehabilitation Services Pager: [phone]

## 2017-08-21 NOTE — Progress Notes (Addendum)
Hypoglycemic Event  CBG: 63  Treatment: D50 IV 25 mL Patient not awake or alert enough for PO's  Symptoms: None  Follow-up CBG: Time: CBG Result:  Possible Reasons for Event: Unknown  Comments/MD notified:    Phelix Fudala Consuella Lose

## 2017-08-21 NOTE — Progress Notes (Signed)
Made oncall MD aware of patient rectal temperature 92.9, orders placed. will continue to monitor and  make day RN aware. Patient also complained of difficulty swallowing.

## 2017-08-21 NOTE — Progress Notes (Signed)
   Subjective:  T 92.9 early this morning, repeat T 93. She is also borderline bradycardic with HR in the low 60s. Patient was seen during rounds this morning and was noted to be somnolent. She follows commands and nods yes and no to questions but non-verbal.  Objective:  Vital signs in last 24 hours: Vitals:   08/20/17 2235 08/21/17 0351 08/21/17 0628 08/21/17 0736  BP: (!) 169/79 (!) 167/90 (!) 165/83   Pulse: 63 66 62   Resp: 15 14 12    Temp:   (!) 92.9 F (33.8 C) (!) 92.9 F (33.8 C)  TempSrc:   Rectal Rectal  SpO2: 100% 100% 100%   Weight:      Height:       Physical Exam  Constitutional:  Chronically ill appearing female laying in bed on her R side. Very somnolent   Cardiovascular:  Bradycardic, nl S1/S2, II/VI SEM unchanged from previous exams   Pulmonary/Chest: Effort normal and breath sounds normal. No respiratory distress. She has no wheezes. She has no rales.  Abdominal: Soft. Bowel sounds are normal. She exhibits no distension. There is no tenderness.  Musculoskeletal: She exhibits no edema.  Neurological:  Patient is very somnolent this morning. Opens her eyes when asked. Non-verbal but nods yes and no to questions.   Skin:  Chronic sacral ulcer bandaged. No purulence noted.     Assessment/Plan:  Principal Problem:   Pneumonia Active Problems:   Type 1 diabetes mellitus (HCC)   Hypertension   Multiple sclerosis (HCC)   Neurogenic bladder   Osteomyelitis of coccyx (HCC)   Sepsis (HCC)   Sacral decubitus ulcer, stage IV (HCC)  # Bilateral pneumonia: Patient appears more somnolent this morning, though able to follow commands. She is now hypothermic and borderline bradycardic. Hypertensive with MAP > 65. Will follow up AM labs and lactic ordered as concern patient might be clinically deteriorating given similar presentation on admission. She is already on broad spectrum antibiotics with Linezolid and meropenem given her history of MDROs. Will continue to  monitor throughout the day and have a low threshold to involve PCCM.  - Continue Linezolid and meropenem x 7 days. Currently on Day 4.  - CBC daily to monitor Hgb and platelets  - Low threshold to consult PCCM as patient now hypothermic again and borderline bradycardic  - Will need repeat CXR in 6 weeks   # Chronic sacral ulcer and osteomyelitis: General surgery following, appreciate assistance . Planning for debridement when medically stable.   # CKD stage II: UOP only 500cc. BMP pending this AM. Will follow up to ensure renal function still at baseline.  - Strict I/Os  - Continue to monitor renal function   # T1DM: CBGs at goal with no episodes of hypoglycemia.  - Lantus 3 units QHS  - SSI-S + CBG monitoring   # MS:  - Continue home baclofen 5 mg TID  - Dilaudid 1 mg q4h PRN + oxycodone 10 mg q6h PRN for chronic pain   HCPOA: [name] [phone]. She confirmed patient is full code on admission.    Dispo: Anticipated discharge in approximately 5-7 day(s).   [name], MD 08/21/2017, 7:53 AM Pager: [phone]

## 2017-08-21 NOTE — Progress Notes (Signed)
  Speech Language Pathology Treatment: Dysphagia  Patient Details Name: Debra [name] MRN: [id number] DOB: 02-04-1966 Today's Date: 08/21/2017 Time: 1445-1500 SLP Time Calculation (min) (ACUTE ONLY): 15 min  Assessment / Plan / Recommendation Clinical Impression  Dysphagia treatment provided for diet tolerance check. Pt minimally responsive today, had low temps/ hypoglycemic this a.m. Pt did take 2 sips of thin liquid by straw without overt s/s of aspiration. Attempted to take additional sips but was then unable to get additional liquid through straw. Offered other liquids/ puree consistencies but pt declined. Recommend continuing dysphagia 1/ thin liquid diet, although continue to have concerns for pt's nutritional status as pt has had minimal PO intake today. Will continue to follow for diet tolerance.  HPI HPI: 52 yo female with MS, neurogenic bladder w/ chronic foley , chronic ulcers /osteomyelitis admitted with Sepsis ?PNA 6/22. CXR 08/18/17 showed new left lower lobe consolidation, concerning for pneumonia. Seen by ST during prior admissions in Feb 2019 and most recently May 2019, followed briefly due to reported difficulty with solids, pocketing foods. Advanced to regular/thin prior to d/c.       SLP Plan  Continue with current plan of care       Recommendations  Diet recommendations: Dysphagia 1 (puree);Thin liquid Liquids provided via: Cup;Straw Medication Administration: Crushed with puree Supervision: Staff to assist with self feeding;Full supervision/cueing for compensatory strategies Compensations: Slow rate;Small sips/bites Postural Changes and/or Swallow Maneuvers: Seated upright 90 degrees                Oral Care Recommendations: Oral care BID Follow up Recommendations: Skilled Nursing facility SLP Visit Diagnosis: Dysphagia, oral phase (R13.11) Plan: Continue with current plan of care       GO                Metro Kung, MA, CCC-SLP 08/21/2017,  3:12 PM  [phone]

## 2017-08-21 NOTE — Progress Notes (Signed)
[facility]/Trauma Progress Note      Assessment/Plan Principal Problem:   Pneumonia Active Problems:   Type 1 diabetes mellitus (HCC)   Hypertension   Multiple sclerosis (HCC)   Neurogenic bladder   Osteomyelitis of coccyx (HCC)   Sepsis (HCC)   Sacral decubitus ulcer, stage IV (HCC)  Sacral wound - ordered hydrotherapy, PT may be able to do some debridement bedside   - wound is clean except for overlying eschar  FEN: dys 1 VTE: SCD's, heparin ID: Merrem & Zyvox Follow up: TBD  DISPO: will try hydrotherapy first. Pt is not yet stable to go to OR    LOS: 3 days    Subjective: CC: sacral wound  Pt is resting but nodded head yes when I asked if I could look at her wound.   Objective: Vital signs in last 24 hours: Temp:  [92.9 F (33.8 C)-97.9 F (36.6 C)] 93 F (33.9 C) (06/25 0755) Pulse Rate:  [62-75] 62 (06/25 0628) Resp:  [10-15] 12 (06/25 0628) BP: (123-175)/(65-90) 165/83 (06/25 0628) SpO2:  [99 %-100 %] 100 % (06/25 0628) Weight:  [57.6 kg (126 lb 15.8 oz)] 57.6 kg (126 lb 15.8 oz) (06/24 1551) Last BM Date: 08/19/17  Intake/Output from previous day: 06/24 0701 - 06/25 0700 In: 1750.9 [I.V.:1205.3; IV Piggyback:535.6] Out: 512.5 [Urine:512.5] Intake/Output this shift: No intake/output data recorded.  PE: Gen:  Alert, NAD, resting Pulm:  Rate and effort normal GU: sacral wound without purulent drainage or foul smell. Moderate area of black eschar otherwise wound is clean.  Anti-infectives: Anti-infectives (From admission, onward)   Start     Dose/Rate Route Frequency Ordered Stop   08/18/17 2200  linezolid (ZYVOX) IVPB 600 mg     600 mg 300 mL/hr over 60 Minutes Intravenous Every 12 hours 08/18/17 0914     08/18/17 1000  linezolid (ZYVOX) IVPB 600 mg     600 mg 300 mL/hr over 60 Minutes Intravenous  Once 08/18/17 0906 08/18/17 1148   08/18/17 1000  meropenem (MERREM) 1 g in sodium chloride 0.9 % 100 mL IVPB     1 g 200 mL/hr  over 30 Minutes Intravenous Every 12 hours 08/18/17 0906     08/18/17 0800  levofloxacin (LEVAQUIN) IVPB 750 mg  Status:  Discontinued     750 mg 100 mL/hr over 90 Minutes Intravenous  Once 08/18/17 0753 08/18/17 0906   08/18/17 0800  aztreonam (AZACTAM) 2 g in sodium chloride 0.9 % 100 mL IVPB     2 g 200 mL/hr over 30 Minutes Intravenous  Once 08/18/17 0753 08/18/17 0929   08/18/17 0800  vancomycin (VANCOCIN) IVPB 1000 mg/200 mL premix  Status:  Discontinued     1,000 mg 200 mL/hr over 60 Minutes Intravenous  Once 08/18/17 0753 08/18/17 0906      Lab Results:  Recent Labs    08/19/17 1845 08/20/17 0432  WBC 5.4 5.2  HGB 8.7* 7.9*  HCT 27.0* 24.6*  PLT 54* 51*   BMET Recent Labs    08/19/17 0334 08/20/17 0432  NA 145 146*  K 3.0* 3.2*  CL 121* 126*  CO2 19* 17*  GLUCOSE 123* 112*  BUN 19 17  CREATININE 1.48* 1.27*  CALCIUM 6.8* 6.3*   PT/INR No results for input(s): LABPROT, INR in the last 72 hours. CMP     Component Value Date/Time   NA 146 (H) 08/20/2017 0432   NA 135 04/21/2015 0956   K 3.2 (L) 08/20/2017 3112  CL 126 (H) 08/20/2017 0432   CO2 17 (L) 08/20/2017 0432   GLUCOSE 112 (H) 08/20/2017 0432   BUN 17 08/20/2017 0432   BUN 11 04/21/2015 0956   CREATININE 1.27 (H) 08/20/2017 0432   CREATININE 0.79 08/28/2014 1223   CALCIUM 6.3 (LL) 08/20/2017 0432   PROT 4.0 (L) 08/19/2017 0334   PROT 6.4 04/21/2015 0956   ALBUMIN 1.0 (L) 08/19/2017 0334   ALBUMIN 3.6 04/21/2015 0956   AST 29 08/19/2017 0334   ALT 6 (L) 08/19/2017 0334   ALKPHOS 454 (H) 08/19/2017 0334   BILITOT 0.7 08/19/2017 0334   BILITOT <0.2 04/21/2015 0956   GFRNONAA 48 (L) 08/20/2017 0432   GFRNONAA 89 08/28/2014 1223   GFRAA 56 (L) 08/20/2017 0432   GFRAA >89 08/28/2014 1223   Lipase  No results found for: LIPASE  Studies/Results: Dg Chest Port 1 View  Result Date: 08/20/2017 CLINICAL DATA:  Pneumonia. EXAM: PORTABLE CHEST 1 VIEW COMPARISON:  August 19, 2017 FINDINGS: Stable  cardiomegaly. The hila and mediastinum are normal. A right PICC line terminates in the SVC. Effusion with underlying opacity in the right base is new. Opacity in the left retrocardiac region is more pronounced in the interval. No other interval changes. IMPRESSION: 1. Stable right PICC line as above. 2. The right layering effusion with underlying opacity in the right base was not seen on the previous study. 3. The opacity in the left base is more pronounced in the interval. Electronically Signed   By: Gerome Sam III M.D   On: 08/20/2017 07:20      Jerre Simon , [facility] 08/21/2017, 8:28 AM  Pager: [phone] Mon-Wed, Friday 7:00am-4:30pm Thurs 7am-11:30am  Consults: [phone]

## 2017-08-21 NOTE — Progress Notes (Signed)
Initial Nutrition Assessment  DOCUMENTATION CODES:   Not applicable  INTERVENTION:   - 1 packet Juven BID, each packet provides 80 calories, 8 grams of carbohydrate, and 14 grams of amino acids; supplement contains CaHMB, glutamine, and arginine, to promote wound healing  - Ensure Enlive po TID with meals each supplement provides 350 kcal and 20 grams of protein  RD to follow for goals of care discussion.   NUTRITION DIAGNOSIS:   Increased nutrient needs related to wound healing as evidenced by estimated needs.  GOAL:   Patient will meet greater than or equal to 90% of their needs  MONITOR:   PO intake, Supplement acceptance, Labs, I & O's, Skin, Weight trends, Other (Comment)(GOC)  REASON FOR ASSESSMENT:   Consult Other (Comment)(T1DM, poor PO intake, hypoglycemia)  ASSESSMENT:   52 year old female who arrived to the ED from [facility] with AMS, hypoglycemia, hypothermia, and hypotension. PMH significant for MS, neurogenic bladder with chronic indwelling catheter and bowel incontinence, chronic sacral ulcers and osteomyelitis, multiple UTIs, and diabetes mellitus type 1.  6/23 - weaned off pressors  Per surgery, plan for surgical debridement of stage IV sacral wound later this week.  No family at bedside. Unable to arouse pt at time of visit. Unable to obtain diet or weight history at this time.  Discussed pt with RN who reports trying to feed breakfast to pt this morning. Pt was not alert enough to consume breakfast. RN to return to pt's room to try with lunch tray which is at pt's bedside.  Pt being warmed at time of visit due to hypothermia (temperature of 92 this morning). Deferred NFPE.  Per weight history in chart, pt has lost 38 lbs since 02/08/17. This is a 23% weight loss in less than 7 months which is significant for timeframe.  RD suspects pt with protein-calorie malnutrition but unable to confirm at this time without NFPE. RD to follow up with pt at a later  date for completion of NFPE.  Medications reviewed and include: 20 mg Pepcid daily, sliding scale Novolog, 3 units Lantus, IV antibiotics  Labs reviewed: sodium 149 (H), chloride 127 (H), CO2 18 (L), creatinine 1.31 (H), calcium 7.2 (L), hemoglobin 8.4 (L), HCT 26.6 (L), TSH 6.263 (H) CBG's: 123, 63, 78, 80, 101, 115, 123 x 24 hours  UOP: 512 ml x 24 hours I/O's: +8.5 L since admission  NUTRITION - FOCUSED PHYSICAL EXAM:  Deferred NFPE as pt is being warmed related to hypothermia this morning (temperature of 92). Pt completely covered by [employer].  Diet Order:   Diet Order           DIET - DYS 1 Room service appropriate? Yes; Fluid consistency: Thin  Diet effective now          EDUCATION NEEDS:   Not appropriate for education at this time  Skin:  Skin Assessment: Skin Integrity Issues: Skin Integrity Issues:: Stage III, Stage IV, Unstageable, DTI DTI: L heel Stage III: R heel Stage IV: sacrum Unstageable: R leg, L ischial tuberosity  Last BM:  08/21/17  Height:   Ht Readings from Last 1 Encounters:  08/20/17 5\' 3"  (1.6 m)    Weight:   Wt Readings from Last 1 Encounters:  08/20/17 126 lb 15.8 oz (57.6 kg)    Ideal Body Weight:  52.3 kg  BMI:  Body mass index is 22.49 kg/m.  Estimated Nutritional Needs:   Kcal:  1700-1900 kcal/day  Protein:  80-95 grams/day  Fluid:  1.7-1.9 L/day  Kate Jablonski Laura-Lee Villegas, MS, RD, LDN Pager: [phone] Weekend/After Hours: [phone]  

## 2017-08-22 DIAGNOSIS — J189 Pneumonia, unspecified organism: Secondary | ICD-10-CM

## 2017-08-22 DIAGNOSIS — G35 Multiple sclerosis: Secondary | ICD-10-CM

## 2017-08-22 DIAGNOSIS — M7989 Other specified soft tissue disorders: Secondary | ICD-10-CM

## 2017-08-22 DIAGNOSIS — Z8619 Personal history of other infectious and parasitic diseases: Secondary | ICD-10-CM

## 2017-08-22 DIAGNOSIS — L89154 Pressure ulcer of sacral region, stage 4: Secondary | ICD-10-CM

## 2017-08-22 DIAGNOSIS — J181 Lobar pneumonia, unspecified organism: Secondary | ICD-10-CM

## 2017-08-22 DIAGNOSIS — D696 Thrombocytopenia, unspecified: Secondary | ICD-10-CM

## 2017-08-22 DIAGNOSIS — M869 Osteomyelitis, unspecified: Secondary | ICD-10-CM

## 2017-08-22 DIAGNOSIS — N182 Chronic kidney disease, stage 2 (mild): Secondary | ICD-10-CM

## 2017-08-22 DIAGNOSIS — E1022 Type 1 diabetes mellitus with diabetic chronic kidney disease: Secondary | ICD-10-CM

## 2017-08-22 LAB — BASIC METABOLIC PANEL
Anion gap: 3 — ABNORMAL LOW (ref 5–15)
BUN: 10 mg/dL (ref 6–20)
CO2: 19 mmol/L — AB (ref 22–32)
CREATININE: 1.13 mg/dL — AB (ref 0.44–1.00)
Calcium: 6.9 mg/dL — ABNORMAL LOW (ref 8.9–10.3)
Chloride: 121 mmol/L — ABNORMAL HIGH (ref 98–111)
GFR calc Af Amer: >60 mL/min (ref >60)
GFR calc non Af Amer: 56 mL/min — ABNORMAL LOW (ref >60)
Glucose, Bld: 196 mg/dL — ABNORMAL HIGH (ref 70–99)
POTASSIUM: 3.5 mmol/L (ref 3.5–5.1)
Sodium: 143 mmol/L (ref 135–145)

## 2017-08-22 LAB — GLUCOSE, CAPILLARY
GLUCOSE-CAPILLARY: 160 mg/dL — AB (ref 70–99)
GLUCOSE-CAPILLARY: 161 mg/dL — AB (ref 70–99)
GLUCOSE-CAPILLARY: 149 mg/dL — AB (ref 70–99)
GLUCOSE-CAPILLARY: 157 mg/dL — AB (ref 70–99)
Glucose-Capillary: 156 mg/dL — ABNORMAL HIGH (ref 70–99)
Glucose-Capillary: 179 mg/dL — ABNORMAL HIGH (ref 70–99)

## 2017-08-22 LAB — CBC
HCT: 25.9 % — ABNORMAL LOW (ref 36.0–46.0)
Hemoglobin: 8.3 g/dL — ABNORMAL LOW (ref 12.0–15.0)
MCH: 27.6 pg (ref 26.0–34.0)
MCHC: 32 g/dL (ref 30.0–36.0)
MCV: 86 fL (ref 78.0–100.0)
Platelets: 35 10*3/uL — ABNORMAL LOW (ref 150–400)
RBC: 3.01 MIL/uL — ABNORMAL LOW (ref 3.87–5.11)
RDW: 18.3 % — ABNORMAL HIGH (ref 11.5–15.5)
WBC: 4.7 10*3/uL (ref 4.0–10.5)

## 2017-08-22 MED ORDER — SODIUM CHLORIDE 0.9 % IV SOLN
INTRAVENOUS | Status: DC
  Administered 2017-08-22 (×2): via INTRAVENOUS

## 2017-08-22 MED ORDER — SODIUM CHLORIDE 0.9 % IV SOLN
INTRAVENOUS | Status: DC
  Administered 2017-08-22 – 2017-08-29 (×3): via INTRAVENOUS

## 2017-08-22 MED ORDER — DEXTROSE-NACL 5-0.45 % IV SOLN
INTRAVENOUS | Status: DC
Start: 2017-08-22 — End: 2017-08-29
  Administered 2017-08-22 – 2017-08-27 (×6): via INTRAVENOUS

## 2017-08-22 MED ORDER — FREE WATER
300.0000 mL | Freq: Three times a day (TID) | Status: DC
  Administered 2017-08-22: 140 mL via ORAL

## 2017-08-22 MED ORDER — SODIUM CHLORIDE 0.9 % IV SOLN
1.0000 g | Freq: Three times a day (TID) | INTRAVENOUS | Status: AC
  Administered 2017-08-22 – 2017-08-24 (×7): 1 g via INTRAVENOUS
  Filled 2017-08-22 (×8): qty 1

## 2017-08-22 NOTE — Consult Note (Signed)
[facility]    Date of Admission:  08/18/2017     Total days of antibiotics 5               Reason for Consult: Sepsis   Referring Provider: Heide [name] Primary Care Provider: [name], MD   Assessment/Plan:  Debra [name] is a 52 y/o female with multiple medical conditions including diabetes, MS, and osteomyelitis presenting to the ED with hypotension, hypoglycemia, and hypothermia found to have thrombocytopenia and chest x-ray concern for pneumonia. She has had 1 elevation in temperature to 100.0 in the last 24 hours and is currently on Day 5 on Meropenem and Linezolid. Source of the infection does not appear to urine, PICC, or sacral ulcer. Clinically she appears to be improving since admission.  1. Discontinue Linezolid as it is likely the source of her continued thrombocytopenia with her baseline trending around 200-400.   2. Continue meropenem for treatment of pneumonia for 7-10 days.  3. Wound care per general surgery and primary team recommendations. She has previously completed extended course of meropenem for coccyx/sacral osteomyelitis.    Principal Problem:   Pneumonia Active Problems:   Type 1 diabetes mellitus (HCC)   Hypertension   Multiple sclerosis (HCC)   Neurogenic bladder   Osteomyelitis of coccyx (HCC)   Sepsis (HCC)   Sacral decubitus ulcer, stage IV (HCC)   . baclofen  5 mg Oral TID  . chlorhexidine  15 mL Mouth Rinse BID  . Chlorhexidine Gluconate Cloth  6 each Topical Q0600  . collagenase   Topical Daily  . famotidine  20 mg Oral Daily  . feeding supplement (ENSURE ENLIVE)  237 mL Oral TID WC  . free water  300 mL Oral Q8H  . heparin injection (subcutaneous)  5,000 Units Subcutaneous Q8H  . insulin aspart  0-9 Units Subcutaneous TID WC  . insulin glargine  3 Units Subcutaneous QHS  . mouth rinse  15 mL Mouth Rinse q12n4p  . mupirocin ointment  1 application Nasal BID  . nutrition supplement (JUVEN)  1 packet Oral  BID BM  . sodium chloride flush  3 mL Intravenous Q12H     HPI: Debra [name] is a 52 y.o. female with a previous medical history of MS, neurogenic bladder with chronic foley catheter, diabetes, and MDRO infections with ESBL Klebsiella and VRE who was admitted to the hospital from her SNF with reports of hypoglycemia, hypoxia, and hypotension. Debra [name] is known to the ID service as she was most recently treated with meropenem with concern for UTI and sacral decubitis ulcer/osteomyelitis on 5/24.  On arrival to the ED she was hypothermic at 90.5, hypoglycemic at 53 (improved from 39), and thrombocytopenic at 65. Chest x-ray with left lower lobe consolidation concerning for pneumonia. Blood cultures were ordered and started on linezolid and meropenem.  She did receive 1 dose of levofloxacin in the ED. Potential sources of infection include decubitis ulcer, wounds, urine or pneumonia.   Since arrival she has had 1 episode of mildly increased temperature of 100.0 in the past 24 hours otherwise has had no leukocytosis or leukopenia. Platelets have continued to decrease since admission with most recent being 35. Blood cultures are currently without growth to date. She continues to receive Linezolid and Meropenem and is currently on Day 5.   She is non-verbal but does answer questions yes/no. Denies any fevers, chills, or sweats. No urinary symptoms. Denies pain currently.   Review of Systems:  Review of Systems  Constitutional: Negative for chills, diaphoresis and fever.  Respiratory: Negative for cough, shortness of breath and wheezing.   Cardiovascular: Negative for chest pain and leg swelling.  Gastrointestinal: Negative for abdominal pain, constipation, diarrhea, nausea and vomiting.  Genitourinary: Negative for dysuria, flank pain, frequency and urgency.  Skin: Negative for rash.     Past Medical History:  Diagnosis Date  . Adenomatous colonic polyps   . Anemia    2005  . Anxiety     1990  . Arthritis    "knees, hands" (05/07/2017)  . Asthma    2000  . Benign essential HTN   . Cataract   . Chronic lower back pain   . Decubital ulcer   . Depression with psychotic symptoms   . Diabetic ketoacidosis without coma associated with type 1 diabetes mellitus (HCC)   . Difficult intubation    narrow airway  . Fibromyalgia 11/07/2016  . Gastroesophageal Reflux Disease (GERD)   . Heart murmur    Birth  . Hyperlipidemia    2005  . Hypertension    1998  . Internal hemorrhoids 04/24/06   on colonoscopy  . Migraine    "used to have them all the time; now maybe q6 months" (05/07/2017)  . Multiple sclerosis (HCC)   . Neuropathic bladder   . Neuropathy of the hands & feet   . Restless Leg Syndrome   . Sepsis due to urinary tract infection (HCC) 04/12/2017  . Sleep paralysis   . Stable angina pectoris    2007: cath showing normal cors.   . Stroke 1990   denies residual on 05/07/2017  . Type I Diabetes Mellitus 1988    Social History   Tobacco Use  . Smoking status: Never Smoker  . Smokeless tobacco: Never Used  Substance Use Topics  . Alcohol use: No    Alcohol/week: 0.0 oz  . Drug use: No    Comment: drug addict    Family History  Problem Relation Age of Onset  . Hypertension Mother   . Kidney disease Mother   . Hypertension Father   . Breast cancer Maternal Grandmother   . Prostate cancer Maternal Grandfather   . Ovarian cancer Paternal Grandmother   . Prostate cancer Paternal Grandfather   . Colon cancer Maternal Uncle        Family history of malignant neoplasm of gastrointestinal tract    Allergies  Allergen Reactions  . Penicillins Anaphylaxis, Nausea And Vomiting and Rash    Has patient had a PCN reaction causing immediate rash, facial/tongue/throat swelling, SOB or lightheadedness with hypotension: Yes Has patient had a PCN reaction causing severe rash involving mucus membranes or skin necrosis: Yes Has patient had a PCN reaction that required  hospitalization No Has patient had a PCN reaction occurring within the last 10 years: Yes If all of the above answers are "NO", then may proceed with Cephalosporin use.   . Lactose Intolerance (Gi) Other (See Comments)  . Pollen Extract Other (See Comments)    Seasonal Allergies  . Tape Rash    OBJECTIVE: Blood pressure 140/67, pulse 81, temperature 98.2 F (36.8 C), temperature source Rectal, resp. rate 11, height 5\' 3"  (1.6 m), weight 139 lb 1.8 oz (63.1 kg), SpO2 100 %.  Physical Exam  Constitutional: She appears well-developed and well-nourished. No distress.  Lying in bed in no distress. Eyes open to voice.   Cardiovascular: Intact distal pulses. An irregular rhythm present.  Murmur heard. PICC line site  appears clean, dry and intact with no evidence of infection including odor or discharge.   Pulmonary/Chest: Effort normal and breath sounds normal.  Abdominal: Soft. Bowel sounds are normal.  Neurological: She is alert.  Skin: Skin is warm and dry.    Lab Results Lab Results  Component Value Date   WBC 4.7 08/22/2017   HGB 8.3 (L) 08/22/2017   HCT 25.9 (L) 08/22/2017   MCV 86.0 08/22/2017   PLT 35 (L) 08/22/2017    Lab Results  Component Value Date   CREATININE 1.13 (H) 08/22/2017   BUN 10 08/22/2017   NA 143 08/22/2017   K 3.5 08/22/2017   CL 121 (H) 08/22/2017   CO2 19 (L) 08/22/2017    Lab Results  Component Value Date   ALT 6 (L) 08/19/2017   AST 29 08/19/2017   ALKPHOS 454 (H) 08/19/2017   BILITOT 0.7 08/19/2017     Microbiology: Recent Results (from the past 240 hour(s))  Blood culture (routine x 2)     Status: None (Preliminary result)   Collection Time: 08/18/17  7:55 AM  Result Value Ref Range Status   Specimen Description BLOOD LEFT ANTECUBITAL  Final   Special Requests   Final    BOTTLES DRAWN AEROBIC AND ANAEROBIC Blood Culture results may not be optimal due to an excessive volume of blood received in culture bottles   Culture   Final     NO GROWTH 3 DAYS Performed at [facility], [address], [place], [place] [address]    Report Status PENDING  Incomplete  Blood culture (routine x 2)     Status: None (Preliminary result)   Collection Time: 08/18/17  8:02 AM  Result Value Ref Range Status   Specimen Description BLOOD LEFT HAND  Final   Special Requests   Final    BOTTLES DRAWN AEROBIC ONLY Blood Culture results may not be optimal due to an excessive volume of blood received in culture bottles   Culture   Final    NO GROWTH 3 DAYS Performed at [facility], [address], [place], [place] [address]    Report Status PENDING  Incomplete  MRSA PCR Screening     Status: None   Collection Time: 08/18/17  4:49 PM  Result Value Ref Range Status   MRSA by PCR NEGATIVE NEGATIVE Final    Comment:        The GeneXpert MRSA Assay (FDA approved for NASAL specimens only), is one component of a comprehensive MRSA colonization surveillance program. It is not intended to diagnose MRSA infection nor to guide or monitor treatment for MRSA infections. Performed at [facility], [address], [place], [place] [address]      Marcos Eke, NP [facility] [phone] Pager  08/22/2017  11:02 AM

## 2017-08-22 NOTE — [place] FL2 (Signed)
MEDICAID FL2 LEVEL OF CARE SCREENING TOOL     IDENTIFICATION  Patient Name: Debra [name] Birthdate: [birth year] Sex: female Admission Date (Current Location): 08/18/2017  [facility] and [place] Number:  Producer, television/film/video and Address:  [facility], [address] [place], [place] [address]      Provider Number: [id number]  Attending Physician Name and Address:  [name], MD  Relative Name and Phone Number:  Dorene Grebe [phone]    Current Level of Care: Hospital Recommended Level of Care: Skilled Nursing Facility Prior Approval Number:    Date Approved/Denied:   PASRR Number: [id number]  Discharge Plan: SNF    Current Diagnoses: Patient Active Problem List   Diagnosis Date Noted  . Pneumonia 08/21/2017  . Sacral osteomyelitis (HCC)   . Sacral decubitus ulcer, stage IV (HCC)   . Malnutrition of moderate degree 07/17/2017  . Sepsis (HCC) 07/16/2017  . Adjustment disorder with mixed anxiety and depressed mood   . MS (multiple sclerosis) (HCC)   . Palliative care by specialist   . Advance care planning   . Major depressive disorder, recurrent episode, moderate (HCC) 07/02/2017  . Staphylococcus epidermidis sepsis (HCC)   . PICC line infection, subsequent encounter   . Severe sepsis (HCC) 06/26/2017  . Acute metabolic encephalopathy   . Acute respiratory failure (HCC)   . Acute pyelonephritis   . Normocytic anemia 05/25/2017  . Decubitus ulcer of coccygeal region, stage IV (HCC) 05/21/2017  . 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 Proteus infection 05/06/2017  . Foley catheter in place on admission 05/06/2017  . Hyperglycemia 05/05/2017  . Bacteriuria 05/05/2017  . Recurrent UTI   . Palliative care encounter   . Goals of care, counseling/discussion   . Pressure injury of skin 04/14/2017  . Aortic atherosclerosis (HCC) 03/14/2017  .  Major depressive disorder, single episode, severe without psychosis (HCC) 01/02/2017  . Dysthymia 12/27/2016  . HTN (hypertension)   . Neurogenic bladder   . Labile blood pressure   . Leukocytosis   . Labile blood glucose   . Sleep disturbance   . Multiple sclerosis (HCC) 11/22/2016  . Thoracic root lesion 11/22/2016  . Neuropathic pain   . Leg weakness, bilateral 11/20/2016  . History of CVA (cerebrovascular accident)   . Uncontrolled type 1 diabetes mellitus with diabetic peripheral neuropathy (HCC)   . Diabetic acidosis (HCC) 11/15/2016  . Back pain 10/31/2016  . AKI (acute kidney injury) (HCC)   . Stable angina pectoris (HCC)   . Vitamin D deficiency 08/31/2014  . 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 Hyperlipidemia 10/06/2013  . Peripheral neuropathy 08/19/2010  . Type 1 diabetes mellitus (HCC) 07/06/2010  . Hypertension 07/06/2010  . Asthma 07/06/2010    Orientation RESPIRATION BLADDER Height & Weight     Self  Normal Continent, Indwelling catheter Weight: 63.1 kg (139 lb 1.8 oz) Height:  5\' 3"  (160 cm)  BEHAVIORAL SYMPTOMS/MOOD NEUROLOGICAL BOWEL NUTRITION STATUS      Incontinent Diet(Please see DC Summary)  AMBULATORY STATUS COMMUNICATION OF NEEDS Skin   Extensive Assist Verbally PU Stage and Appropriate Care, Hydro Therapy(Stage III on heel;unstageable on leg;Deep tissue injury on heel; Unstageable on Ischial tuberosity;Stage IV on sacrum)                       Personal Care Assistance Level of Assistance  Bathing, Feeding,  Dressing Bathing Assistance: Maximum assistance Feeding assistance: Maximum assistance Dressing Assistance: Maximum assistance     Functional Limitations Info  Sight, Hearing, Speech Sight Info: Adequate Hearing Info: Adequate Speech Info: Adequate    SPECIAL CARE FACTORS FREQUENCY                       Contractures      Additional Factors Info  Code Status, Allergies, Insulin Sliding  Scale, Isolation Precautions Code Status Info: Full Allergies Info: Penicillins, Lactose Intolerance (Gi), Pollen Extract, Tape   Insulin Sliding Scale Info: 3x daily with meals and at bedtime Isolation Precautions Info: ESBL; VRE     Current Medications (08/22/2017):  This is the current hospital active medication list Current Facility-Administered Medications  Medication Dose Route Frequency Provider Last Rate Last Dose  . 0.9 %  sodium chloride infusion   Intravenous Continuous [name], MD 10 mL/hr at 08/22/17 1016    . 0.9 %  sodium chloride infusion   Intravenous Continuous [name], MD 10 mL/hr at 08/22/17 1014    . acetaminophen (TYLENOL) tablet 650 mg  650 mg Oral Q6H PRN Parrett, Tammy S, NP       Or  . acetaminophen (TYLENOL) suppository 650 mg  650 mg Rectal Q6H PRN Parrett, Tammy S, NP      . baclofen (LIORESAL) tablet 5 mg  5 mg Oral TID Parrett, Tammy S, NP   5 mg at 08/22/17 1600  . chlorhexidine (PERIDEX) 0.12 % solution 15 mL  15 mL Mouth Rinse BID [name], MD   15 mL at 08/22/17 0932  . Chlorhexidine Gluconate Cloth 2 % PADS 6 each  6 each Topical Q0600 [name], MD   6 each at 08/22/17 0217  . collagenase (SANTYL) ointment   Topical Daily Santos-Sanchez, Idalys, MD      . dextrose 5 %-0.45 % sodium chloride infusion   Intravenous Continuous [name], MD 75 mL/hr at 08/22/17 1606    . famotidine (PEPCID) tablet 20 mg  20 mg Oral Daily [name], MD   20 mg at 08/22/17 0932  . feeding supplement (ENSURE ENLIVE) (ENSURE ENLIVE) liquid 237 mL  237 mL Oral TID WC Narendra, Nischal, MD   237 mL at 08/22/17 1005  . heparin injection 5,000 Units  5,000 Units Subcutaneous Q8H Santos-Sanchez, Chelsea Primus, MD   5,000 Units at 08/22/17 1600  . hydrALAZINE (APRESOLINE) injection 10 mg  10 mg Intravenous Q4H PRN [name], MD   10 mg at 08/22/17 0619  . HYDROmorphone (DILAUDID) injection 1 mg  1 mg  Intravenous Q4H PRN [name], MD   1 mg at 08/22/17 1038  . insulin aspart (novoLOG) injection 0-9 Units  0-9 Units Subcutaneous TID WC [name], MD   2 Units at 08/22/17 1409  . insulin glargine (LANTUS) injection 3 Units  3 Units Subcutaneous QHS [name], MD   3 Units at 08/21/17 2212  . MEDLINE mouth rinse  15 mL Mouth Rinse q12n4p Santos-Sanchez, Idalys, MD   15 mL at 08/22/17 1622  . meropenem (MERREM) 1 g in sodium chloride 0.9 % 100 mL IVPB  1 g Intravenous Q8H Robertson, Crystal S, RPH      . mupirocin ointment (BACTROBAN) 2 % 1 application  1 application Nasal BID [name], MD   1 application at 08/22/17 1036  . nutrition supplement (JUVEN) (JUVEN) powder packet 1 packet  1 packet Oral BID BM [name], MD   1  packet at 08/22/17 1409  . ondansetron (ZOFRAN) tablet 4 mg  4 mg Oral Q6H PRN Parrett, Tammy S, NP       Or  . ondansetron (ZOFRAN) injection 4 mg  4 mg Intravenous Q6H PRN Parrett, Tammy S, NP      . oxyCODONE (Oxy IR/ROXICODONE) immediate release tablet 5 mg  5 mg Oral Q6H PRN Santos-Sanchez, Idalys, MD      . sodium chloride flush (NS) 0.9 % injection 3 mL  3 mL Intravenous Q12H Parrett, Tammy S, NP   3 mL at 08/22/17 1046     Discharge Medications: Please see discharge summary for a list of discharge medications.  Relevant Imaging Results:  Relevant Lab Results:   Additional Information SS#: [id number] [place], [place]

## 2017-08-22 NOTE — Progress Notes (Signed)
  Speech Language Pathology Treatment: Dysphagia  Patient Details Name: Debra [name] MRN: [id number] DOB: 06-Apr-1965 Today's Date: 08/22/2017 Time: 7591-6384 SLP Time Calculation (min) (ACUTE ONLY): 8 min  Assessment / Plan / Recommendation Clinical Impression  Dysphagia treatment provided to check diet tolerance. Subjectively pt appears more alert and responsive today compared to previous date. Pt consumed several sips of thin liquid by straw without overt s/s of aspiration but declined puree or ice cream. RN reports pt has had liquids without difficulty but no other PO intake this date. Continued concern for pt receiving adequate nutrition and aspiration risk is increased due to deconditioning/ lethargy. Recommend continuing current diet for now, will continue to follow for tolerance. If pt continues to decline puree consistency, may be beneficial to adjust diet to full liquid.  HPI HPI: 52 yo female with MS, neurogenic bladder w/ chronic foley , chronic ulcers /osteomyelitis admitted with Sepsis ?PNA 6/22. CXR 08/18/17 showed new left lower lobe consolidation, concerning for pneumonia. Seen by ST during prior admissions in Feb 2019 and most recently May 2019, followed briefly due to reported difficulty with solids, pocketing foods. Advanced to regular/thin prior to d/c.       SLP Plan  Continue with current plan of care       Recommendations  Diet recommendations: Dysphagia 1 (puree);Thin liquid Liquids provided via: Cup;Straw Medication Administration: Crushed with puree Supervision: Staff to assist with self feeding;Full supervision/cueing for compensatory strategies Compensations: Slow rate;Small sips/bites Postural Changes and/or Swallow Maneuvers: Seated upright 90 degrees                Oral Care Recommendations: Oral care BID Follow up Recommendations: Skilled Nursing facility SLP Visit Diagnosis: Dysphagia, oral phase (R13.11) Plan: Continue with current plan of  care       GO                Metro Kung, MA, CCC-SLP 08/22/2017, 3:08 PM  [id number]

## 2017-08-22 NOTE — Progress Notes (Signed)
Subjective:  No acute events overnight. Mental status improved this morning. Patient answers yes/no to questions but not very verbal. Able to voice needs and follow commands. No complaints this morning.    Objective:  Vital signs in last 24 hours: Vitals:   08/22/17 0425 08/22/17 0500 08/22/17 0619 08/22/17 0750  BP: (!) 176/86  (!) 185/91 140/67  Pulse: 82   81  Resp: 16   11  Temp:    98.2 F (36.8 C)  TempSrc:    Rectal  SpO2: 100%   100%  Weight:  139 lb 1.8 oz (63.1 kg)    Height:       Physical Exam  Constitutional: She is oriented to person, place, and time.  Chronically ill appearing female laying in bed in no acute distress   Cardiovascular: Normal rate and regular rhythm. Exam reveals no gallop and no friction rub.  Murmur (II/VI SEM unchanged from prior exams) heard. Pulmonary/Chest: Effort normal. No respiratory distress.  Abdominal: Soft. Bowel sounds are normal. She exhibits no distension. There is no tenderness.  Musculoskeletal: She exhibits edema (Bilateral UE edema worse at the elbows ).  PICC line in place in LUE with no signs of infection noted   Neurological: She is alert and oriented to person, place, and time.  Alert and able to follow commands and voice needs     Assessment/Plan:  Principal Problem:   Pneumonia Active Problems:   Type 1 diabetes mellitus (HCC)   Hypertension   Multiple sclerosis (HCC)   Neurogenic bladder   Osteomyelitis of coccyx (HCC)   Sepsis (HCC)   Sacral decubitus ulcer, stage IV (HCC)   # Bilateral pneumonia: Mental status improved this morning. Patient is alert and answering questions appropriately though not very verbal. No further hypothermia documented and remains hemodynamically stable. She is currently on Linezolid and meropenem due to prior history of VRE and ESBL Klebsiella, though neither have been isolated during this admission. Would favor continuing meropenem and discontinuing linezolid in the setting of  worsening thrombocytopenia. Will continue current therapy and consult ID for further recommendations.   - ID consulted, appreciate assistance  - Continue Linezolid and meropenem x 7 days. Currently on Day 5.   - CBC daily to monitor Hgb and platelets  - Low threshold to consult PCCM as patient now hypothermic again and borderline bradycardic  - Will need repeat CXR in 6 weeks   # Thrombocytopenia: Patient presented with platelet count of 52 in the setting of septic shock. She was started on linezolid and meropenem due history of MDROs in the past and  her platelet count has continue to trend down. ID consulted as above.  - Daily CBC to monitor platelet count   # Bilateral UE swelling: Patient noted to have 1-2+ pitting edema at bilateral upper extremities that is worse at the elbows. Could be volume overload as she has received a total of 11L during this admission, but also concern for DVT as she has had multiple PICC lines placed in the past for long courses of IV antibiotic therapy.  - Vascular US ordered   # Chronic sacral ulcer and osteomyelitis: General surgery recommended hydrotherapy which was initiated yesterday with debridement of wound. No surgical debridement needed. Surgery has signed off.   # CKD stage II: UOP 1.8L today. Renal function at baseline. - Strict I/Os  - Continue to monitor renal function   # T1DM: CBGs at goal with no episodes of hypoglycemia.  - Lantus 3  units QHS  - SSI-S + CBG monitoring   # MS:  - Continue home baclofen 5 mg TID  - Dilaudid 1 mg q4h PRN + oxycodone 10 mg q6h PRN for chronic pain   HCPOA: [name] [phone]. She confirmed patient is full code on admission.   Dispo: Anticipated discharge in approximately 3-5 day(s).   [name], MD 08/22/2017, 10:33 AM Pager: [phone]

## 2017-08-22 NOTE — Progress Notes (Signed)
   08/22/17 1824  Urine Characteristics  Bladder Scan Volume (mL) 604 mL

## 2017-08-22 NOTE — Final Consult Note (Signed)
Consultant Final Sign-Off Note    Assessment/Final recommendations  Debra [name] is a 52 y.o. female followed by me for sacral wound   Wound care (if applicable): continue hydrotherapy until discharge then wet to dry dressing changes BID   Diet at discharge: per primary team   Activity at discharge: per primary team   Follow-up appointment:     Pending results:  Unresulted Labs (From admission, onward)   Start     Ordered   08/21/17 0500  CBC  Daily,   R    Question:  Specimen collection method  Answer:  Unit=Unit collect   08/20/17 1052   08/21/17 0500  Basic metabolic panel  Daily,   R    Question:  Specimen collection method  Answer:  Unit=Unit collect   08/20/17 1052       Medication recommendations:   Other recommendations:    Thank you for allowing [place] to participate in the care of your patient!  Please consult [place] again if you have further needs for your patient.  Jerre Simon 08/22/2017 9:37 AM    Subjective   CC: sacral wound  Pt denies pain in her bottom.   Objective  Vital signs in last 24 hours: Temp:  [95.7 F (35.4 C)-100 F (37.8 C)] 98.2 F (36.8 C) (06/26 0750) Pulse Rate:  [70-90] 81 (06/26 0750) Resp:  [7-24] 11 (06/26 0750) BP: (140-185)/(67-91) 140/67 (06/26 0750) SpO2:  [99 %-100 %] 100 % (06/26 0750) Weight:  [63.1 kg (139 lb 1.8 oz)] 63.1 kg (139 lb 1.8 oz) (06/26 0500)  PE: Gen:  Alert, NAD, cooperative Pulm:  Rate and effort normal GU: sacral wound without purulent drainage or foul smell. Wound is clean with some slough      Pertinent labs and Studies: Recent Labs    08/19/17 1845 08/20/17 0432 08/21/17 0908  WBC 5.4 5.2 4.8  HGB 8.7* 7.9* 8.4*  HCT 27.0* 24.6* 26.6*   BMET Recent Labs    08/20/17 0432 08/21/17 0908  NA 146* 149*  K 3.2* 4.2  CL 126* 127*  CO2 17* 18*  GLUCOSE 112* 80  BUN 17 14  CREATININE 1.27* 1.31*  CALCIUM 6.3* 7.2*   No results for input(s): LABURIN in the last 72  hours. Results for orders placed or performed during the hospital encounter of 08/18/17  Blood culture (routine x 2)     Status: None (Preliminary result)   Collection Time: 08/18/17  7:55 AM  Result Value Ref Range Status   Specimen Description BLOOD LEFT ANTECUBITAL  Final   Special Requests   Final    BOTTLES DRAWN AEROBIC AND ANAEROBIC Blood Culture results may not be optimal due to an excessive volume of blood received in culture bottles   Culture   Final    NO GROWTH 3 DAYS Performed at [facility], [address], [place], [place] [address]    Report Status PENDING  Incomplete  Blood culture (routine x 2)     Status: None (Preliminary result)   Collection Time: 08/18/17  8:02 AM  Result Value Ref Range Status   Specimen Description BLOOD LEFT HAND  Final   Special Requests   Final    BOTTLES DRAWN AEROBIC ONLY Blood Culture results may not be optimal due to an excessive volume of blood received in culture bottles   Culture   Final    NO GROWTH 3 DAYS Performed at [facility], [address] [place],  [place] [address]    Report Status PENDING  Incomplete  MRSA PCR Screening     Status: None   Collection Time: 08/18/17  4:49 PM  Result Value Ref Range Status   MRSA by PCR NEGATIVE NEGATIVE Final    Comment:        The GeneXpert MRSA Assay (FDA approved for NASAL specimens only), is one component of a comprehensive MRSA colonization surveillance program. It is not intended to diagnose MRSA infection nor to guide or monitor treatment for MRSA infections. Performed at [facility], [address], [place], [place] [address]     Imaging: No results found.

## 2017-08-22 NOTE — Progress Notes (Signed)
Physical Therapy Wound Treatment Patient Details  Name: Debra [name] MRN: [id number] Date of Birth: Jul 22, 1965  Today's Date: 08/22/2017 Time: 4765-4650 Time Calculation (min): 32 min  Subjective  Subjective: Rouses with movement but minimally responsive throughout session.  Patient and Family Stated Goals: None stated  Pain Score:  Pt did not indicate any pain throughout session.   Wound Assessment  Pressure Injury 05/07/17 Stage IV - Full thickness tissue loss with exposed bone, tendon or muscle. Sacral Wound (Active)  Wound Image   08/21/2017  2:59 PM  Dressing Type ABD;Barrier Film (skin prep);Gauze (Comment);Moist to dry 08/22/2017  1:00 PM  Dressing Intact;Dry;Clean 08/22/2017  1:00 PM  Dressing Change Frequency Daily 08/22/2017  1:00 PM  State of Healing Non-healing 08/22/2017  1:00 PM  Site / Wound Assessment Red;Black;Yellow 08/22/2017  1:00 PM  % Wound base Red or Granulating 40% 08/22/2017  1:00 PM  % Wound base Yellow/Fibrinous Exudate 40% 08/22/2017  1:00 PM  % Wound base Black/Eschar 20% 08/22/2017  1:00 PM  Peri-wound Assessment Intact 08/22/2017  1:00 PM  Wound Length (cm) 10 cm 08/21/2017  2:59 PM  Wound Width (cm) 10.3 cm 08/21/2017  2:59 PM  Wound Depth (cm) 1.9 cm 08/21/2017  2:59 PM  Wound Surface Area (cm^2) 103 cm^2 08/21/2017  2:59 PM  Wound Volume (cm^3) 195.7 cm^3 08/21/2017  2:59 PM  Undermining (cm) 1.3 cm from 8:00-11:00, 1:00-2:00 08/21/2017  2:59 PM  Margins Unattached edges (unapproximated) 08/22/2017  1:00 PM  Drainage Amount Moderate 08/22/2017  1:00 PM  Drainage Description Serosanguineous 08/22/2017  1:00 PM  Treatment Debridement (Selective);Hydrotherapy (Pulse lavage);Packing (Saline gauze) 08/22/2017  1:00 PM   Santyl applied to wound bed prior to applying dressing.    Pressure Injury 08/19/17 Unstageable - Full thickness tissue loss in which the base of the ulcer is covered by slough (yellow, tan, gray, green or brown) and/or eschar (tan, brown or black)  in the wound bed. (Active)  Wound Image   08/21/2017  2:59 PM  Dressing Type Foam 08/22/2017  1:00 PM  Dressing Clean;Dry;Intact 08/22/2017  1:00 PM  Dressing Change Frequency Daily 08/22/2017  1:00 PM  State of Healing Non-healing 08/22/2017  1:00 PM  Site / Wound Assessment Pink;Yellow 08/22/2017  1:00 PM  % Wound base Red or Granulating 5% 08/22/2017  1:00 PM  % Wound base Yellow/Fibrinous Exudate 95% 08/22/2017  1:00 PM  % Wound base Black/Eschar 0% 08/22/2017  1:00 PM  Peri-wound Assessment Intact 08/22/2017  1:00 PM  Wound Length (cm) 0.5 cm 08/21/2017  2:59 PM  Wound Width (cm) 0.5 cm 08/21/2017  2:59 PM  Wound Depth (cm) 0.1 cm 08/21/2017  2:59 PM  Wound Surface Area (cm^2) 0.25 cm^2 08/21/2017  2:59 PM  Wound Volume (cm^3) 0.02 cm^3 08/21/2017  2:59 PM  Tunneling (cm) 0 08/21/2017  2:59 PM  Undermining (cm) 0 08/21/2017  2:59 PM  Margins Unattached edges (unapproximated) 08/22/2017  1:00 PM  Drainage Amount Minimal 08/22/2017  1:00 PM  Drainage Description Serosanguineous 08/22/2017  1:00 PM  Treatment Cleansed 08/22/2017  1:00 PM   Santyl applied to wound bed prior to applying dressing.     Hydrotherapy Pulsed lavage therapy - wound location: Sacrum and Ischium Pulsed Lavage with Suction (psi): 8 psi Pulsed Lavage with Suction - Normal Saline Used: 1000 mL Pulsed Lavage Tip: Tip with splash shield Selective Debridement Selective Debridement - Location: Sacrum Selective Debridement - Tools Used: Forceps;Scalpel Selective Debridement - Tissue Removed: Black eschar and yellow necrotic tissue  Wound Assessment and Plan  Wound Therapy - Assess/Plan/Recommendations Wound Therapy - Clinical Statement: Throughout session pt with low respirations in sidelying. RR decreased to 5 bpm and dressing replaced/session ended. Pt was repositioned in supine with HOB elevated to 30. RR increased to 12 bpm by end of session. Pt will benefit from continued hydrotherapy for selective debridement of unviable  tissue and to promote wound bed healing.  Wound Therapy - Functional Problem List: Decreased tolerance for positional changes Factors Delaying/Impairing Wound Healing: Altered sensation;Diabetes Mellitus;Incontinence;Infection - systemic/local;Immobility;Multiple medical problems;Polypharmacy Hydrotherapy Plan: Debridement;Dressing change;Patient/family education;Pulsatile lavage with suction Wound Therapy - Frequency: 6X / week Wound Therapy - Follow Up Recommendations: Skilled nursing facility Wound Plan: See above  Wound Therapy Goals- Improve the function of patient's integumentary system by progressing the wound(s) through the phases of wound healing (inflammation - proliferation - remodeling) by: Decrease Necrotic Tissue to: 0% Decrease Necrotic Tissue - Progress: Progressing toward goal Increase Granulation Tissue to: 100% Increase Granulation Tissue - Progress: Progressing toward goal Goals/treatment plan/discharge plan were made with and agreed upon by patient/family: Yes Time For Goal Achievement: 7 days Wound Therapy - Potential for Goals: Good  Goals will be updated until maximal potential achieved or discharge criteria met.  Discharge criteria: when goals achieved, discharge from hospital, MD decision/surgical intervention, no progress towards goals, refusal/missing three consecutive treatments without notification or medical reason.  GP     Thelma Comp 08/22/2017, 2:08 PM   Rolinda Roan, PT, DPT Acute Rehabilitation Services Pager: [phone]

## 2017-08-23 ENCOUNTER — Inpatient Hospital Stay (HOSPITAL_COMMUNITY): Payer: Medicaid Other

## 2017-08-23 DIAGNOSIS — M7989 Other specified soft tissue disorders: Secondary | ICD-10-CM

## 2017-08-23 DIAGNOSIS — F801 Expressive language disorder: Secondary | ICD-10-CM

## 2017-08-23 LAB — GLUCOSE, CAPILLARY
GLUCOSE-CAPILLARY: 94 mg/dL (ref 70–99)
Glucose-Capillary: 120 mg/dL — ABNORMAL HIGH (ref 70–99)
Glucose-Capillary: 102 mg/dL — ABNORMAL HIGH (ref 70–99)
Glucose-Capillary: 117 mg/dL — ABNORMAL HIGH (ref 70–99)

## 2017-08-23 LAB — CULTURE, BLOOD (ROUTINE X 2)
CULTURE: NO GROWTH
Culture: NO GROWTH

## 2017-08-23 LAB — CBC
HEMATOCRIT: 25.7 % — AB (ref 36.0–46.0)
Hemoglobin: 8.4 g/dL — ABNORMAL LOW (ref 12.0–15.0)
MCH: 28.1 pg (ref 26.0–34.0)
MCHC: 32.7 g/dL (ref 30.0–36.0)
MCV: 86 fL (ref 78.0–100.0)
PLATELETS: 31 10*3/uL — AB (ref 150–400)
RBC: 2.99 MIL/uL — AB (ref 3.87–5.11)
RDW: 18.3 % — ABNORMAL HIGH (ref 11.5–15.5)
WBC: 4.8 10*3/uL (ref 4.0–10.5)

## 2017-08-23 LAB — BASIC METABOLIC PANEL
Anion gap: 3 — ABNORMAL LOW (ref 5–15)
BUN: 9 mg/dL (ref 6–20)
CALCIUM: 7.3 mg/dL — AB (ref 8.9–10.3)
CO2: 20 mmol/L — ABNORMAL LOW (ref 22–32)
CREATININE: 1.01 mg/dL — AB (ref 0.44–1.00)
Chloride: 122 mmol/L — ABNORMAL HIGH (ref 98–111)
GFR calc Af Amer: >60 mL/min (ref >60)
GFR calc non Af Amer: >60 mL/min (ref >60)
Glucose, Bld: 112 mg/dL — ABNORMAL HIGH (ref 70–99)
Potassium: 3.6 mmol/L (ref 3.5–5.1)
Sodium: 145 mmol/L (ref 135–145)

## 2017-08-23 MED ORDER — NYSTATIN 100000 UNIT/ML MT SUSP
5.0000 mL | Freq: Four times a day (QID) | OROMUCOSAL | Status: DC
  Administered 2017-08-23 – 2017-09-05 (×18): 500000 [IU] via ORAL
  Filled 2017-08-23 (×27): qty 5

## 2017-08-23 MED ORDER — SILVER NITRATE-POT NITRATE 75-25 % EX MISC
1.0000 "application " | Freq: Once | CUTANEOUS | Status: AC
  Administered 2017-08-23: 1 via TOPICAL
  Filled 2017-08-23: qty 1

## 2017-08-23 MED ORDER — SILVER NITRATE-POT NITRATE 75-25 % EX MISC
1.0000 "application " | CUTANEOUS | Status: DC | PRN
  Filled 2017-08-23: qty 1

## 2017-08-23 NOTE — Progress Notes (Signed)
    [facility]    Date of Admission:  08/18/2017   Total days of antibiotics 6          ID: Debra [name] is a 52 y.o. female with severe MS bedbound with sacral osteo, decub ulcer, and pneumonia Principal Problem:   Pneumonia Active Problems:   Type 1 diabetes mellitus (HCC)   Hypertension   Multiple sclerosis (HCC)   Neurogenic bladder   Osteomyelitis of coccyx (HCC)   Sepsis (HCC)   Sacral decubitus ulcer, stage IV (HCC)    Subjective: Afebrile undergoing bedside debridement   Medications:  . baclofen  5 mg Oral TID  . chlorhexidine  15 mL Mouth Rinse BID  . Chlorhexidine Gluconate Cloth  6 each Topical Q0600  . collagenase   Topical Daily  . famotidine  20 mg Oral Daily  . feeding supplement (ENSURE ENLIVE)  237 mL Oral TID WC  . heparin injection (subcutaneous)  5,000 Units Subcutaneous Q8H  . insulin aspart  0-9 Units Subcutaneous TID WC  . insulin glargine  3 Units Subcutaneous QHS  . mouth rinse  15 mL Mouth Rinse q12n4p  . mupirocin ointment  1 application Nasal BID  . nutrition supplement (JUVEN)  1 packet Oral BID BM  . nystatin  5 mL Oral QID  . sodium chloride flush  3 mL Intravenous Q12H    Objective: Vital signs in last 24 hours: Temp:  [97.3 F (36.3 C)-98 F (36.7 C)] 97.5 F (36.4 C) (06/27 0658) Pulse Rate:  [73-76] 75 (06/27 0658) Resp:  [9-15] 15 (06/27 0658) BP: (121-177)/(65-92) 158/80 (06/27 0658) SpO2:  [100 %] 100 % (06/27 5041) Physical Exam  Constitutional:  oriented to person, appears well-developed and chronically ill. No distress.  HENT: [place]/AT, PERRLA, no scleral icterus Mouth/Throat: Oropharynx is clear and moist. No oropharyngeal exudate.  Cardiovascular: Normal rate, regular rhythm and normal heart sounds. Exam reveals no gallop and no friction rub.  No murmur heard.  Pulmonary/Chest: Effort normal and breath sounds normal. No respiratory distress.  has no wheezes.  Ext= pitting edema to  arms Buttocks = large sacral decub with 35-40% with slough but majority of wound bed has good granulation tissue  Psychiatric: flat affect  Lab Results Recent Labs    08/22/17 0942 08/23/17 1117 08/23/17 1208  WBC 4.7  --  4.8  HGB 8.3*  --  8.4*  HCT 25.9*  --  25.7*  NA 143 145  --   K 3.5 3.6  --   CL 121* 122*  --   CO2 19* 20*  --   BUN 10 9  --   CREATININE 1.13* 1.01*  --     Microbiology: reviewed Studies/Results: No results found.   Assessment/Plan: Pneumonia = treat for a total of 7 days then watch off of abtx  Sacral decub = continue with local wound care  Arm swelling = agree with getting [place] to rule out DVT  Thrombocytopenia = if plt not improving in the next 3d, would consider looking at other sources causing low plt  Oozing from sacral decub = likely due to thrombocytopenia. Consider also checking INR  Will sign off   [facility] Cell: [phone] Pager: [phone]  08/23/2017, 3:32 PM

## 2017-08-23 NOTE — Progress Notes (Signed)
Subjective:  No acute events overnight. Patient remains non-verbal. Complaining of sore throat but unable to open mouth for further evaluation. Reports no appetite.   Objective:  Vital signs in last 24 hours: Vitals:   08/23/17 0047 08/23/17 0147 08/23/17 0247 08/23/17 0658  BP: (!) 169/90 (!) 177/92 121/65 (!) 158/80  Pulse: 73 73 75 75  Resp: (!) 9 10 12 15   Temp:   (!) 97.4 F (36.3 C) (!) 97.5 F (36.4 C)  TempSrc:   Oral   SpO2: 100% 100% 100% 100%  Weight:      Height:       Physical Exam  Constitutional: She is oriented to person, place, and time.  Frail, chronically ill appearing female laying in bed in no acute distress   Cardiovascular: Normal rate and regular rhythm. Exam reveals no gallop and no friction rub.  Murmur (II/ VI SEM unchanged) heard. Pulmonary/Chest: Breath sounds normal. No respiratory distress. She has no wheezes. She has no rales.  Abdominal: Soft. Bowel sounds are normal. She exhibits no distension. There is no tenderness.  Musculoskeletal: She exhibits no edema.  Neurological: She is alert and oriented to person, place, and time.  Patient able to follow commands however remains non-verbal     Assessment/Plan:  Principal Problem:   Pneumonia Active Problems:   Type 1 diabetes mellitus (HCC)   Hypertension   Multiple sclerosis (HCC)   Neurogenic bladder   Osteomyelitis of coccyx (HCC)   Sepsis (HCC)   Sacral decubitus ulcer, stage IV (HCC)  # Bilateral pneumonia:Patient appears alert and oriented to self and place. Very minimal interation with staff and non-verbal which is a new change form baseline. She does complain of sore throat but unable to examine as patient does not open her mouth due to pain. She is otherwise stable from a respiratory standpoint. Afebrile, HDS, and oxygenating well on RA. On day 6 of meropenem. Linezolid discontinued per ID recommendations due to severe thrombocytopenia. Monitoring platelet counts. I have consulted  palliative care for GOC discussion as unfortunately her functional status has markedly declined and she is severely malnourished. Her aunt Debra [name]) agreed with this and would like to be present for GOC discussion. It seems she might interested in a feeding tube, though unclear.  - Palliative care consulted, appreciate assistance  - ID consulted, appreciate assistance  - Meropenem x 7 days. Currently on Day6.  - CBC dailyto monitorHgb andplatelets  - Will need repeat CXR in 6 weeks   # Thrombocytopenia: Platelet count has continue to trend down during this admission. Last one recorded was 35 yesterday. Awaiting for blood work from today. Linezolid stopped yesterday. Will continue to monitor. Thus far Hgb has remained stable and no signs/symptoms of bleeding.  - Daily CBC to monitor platelet count   # Bilateral UE swelling: Suspect this is secondary to IVF hydration, but getting vascular US to rule out DVT. Will follow up.  - Vascular US ordered   # Chronic sacral ulcer and osteomyelitis: On hydrotherapy.   # CKD stage XJ:DBZMCEYE UOP. Renal function at baseline. Will continue to monitor.  - Strict I/Os  - Continue to monitor renal function   # T1DM:CBGs at goal with no episodes of hypoglycemia. - Lantus 3 units QHS  - SSI-S + CBG monitoring   # MS:  - Continue home baclofen 5 mg TID  - Dilaudid 1 mg q4h PRN + oxycodone 10 mg q6h PRN for chronic pain    Dispo: Anticipated discharge in  approximately 1-3 day(s) pending completion of antibiotic therapy and GOC discussion with palliative care.   [name], MD 08/23/2017, 10:45 AM Pager: [phone]

## 2017-08-23 NOTE — Progress Notes (Addendum)
Bilateral upper extremity venous duplex has been completed. Negative for obvious evidence of DVT. 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 Results were given to the patient's nurse, Ryan.   08/23/17 1:02 PM Olen Cordial RVT

## 2017-08-23 NOTE — Progress Notes (Signed)
Physical Therapy Wound Treatment Patient Details  Name: Debra [name] MRN: [id number] Date of Birth: 17-Feb-1966  Today's Date: 08/23/2017 Time: 8127-5170 Time Calculation (min): 40 min  Subjective  Subjective: Rouses with movement but minimally responsive throughout session. Respirations remains around 12 during session.   Patient and Family Stated Goals: None stated  Pain Score:    Wound Assessment  Pressure Injury 05/07/17 Stage IV - Full thickness tissue loss with exposed bone, tendon or muscle. Sacral Wound (Active)  Dressing Type ABD;Barrier Film (skin prep);Gauze (Comment);Moist to dry 08/23/2017  5:51 PM  Dressing Intact;Dry;Clean 08/23/2017  5:51 PM  Dressing Change Frequency Daily 08/23/2017  5:51 PM  State of Healing Non-healing 08/23/2017  5:51 PM  Site / Wound Assessment Red;Black;Yellow 08/23/2017  5:51 PM  % Wound base Red or Granulating 40% 08/23/2017  5:51 PM  % Wound base Yellow/Fibrinous Exudate 40% 08/23/2017  5:51 PM  % Wound base Black/Eschar 20% 08/23/2017  5:51 PM  Peri-wound Assessment Intact 08/23/2017  5:51 PM  Wound Length (cm) 10 cm 08/21/2017  2:59 PM  Wound Width (cm) 10.3 cm 08/21/2017  2:59 PM  Wound Depth (cm) 1.9 cm 08/21/2017  2:59 PM  Wound Surface Area (cm^2) 103 cm^2 08/21/2017  2:59 PM  Wound Volume (cm^3) 195.7 cm^3 08/21/2017  2:59 PM  Undermining (cm) 1.3 cm from 8:00-11:00, 1:00-2:00 08/21/2017  2:59 PM  Margins Unattached edges (unapproximated) 08/23/2017  5:51 PM  Drainage Amount Copious 08/23/2017  5:51 PM  Drainage Description Serosanguineous 08/23/2017  5:51 PM  Treatment Debridement (Selective);Hydrotherapy (Pulse lavage);Packing (Saline gauze) 08/22/2017  1:00 PM     Santyl applied to wound bed prior to applying dressing.  Hydrotherapy Pulsed lavage therapy - wound location: Sacrum and Ischium Pulsed Lavage with Suction (psi): 8 psi Pulsed Lavage with Suction - Normal Saline Used: 1000 mL Pulsed Lavage Tip: Tip with splash shield Selective  Debridement Selective Debridement - Location: Sacrum Selective Debridement - Tools Used: Forceps;Scalpel Selective Debridement - Tissue Removed: Black eschar and yellow necrotic tissue   Wound Assessment and Plan  Wound Therapy - Assess/Plan/Recommendations Wound Therapy - Clinical Statement: Pt pre tx presents with bleeding through bandage, performed removal of banadage and bleeding noted at L center of wound bed.  Pt required silver nitrate post pulse lavage to stop bleeding.  However some bleeding remained.  Minimal debridement performed due to increased bleeding.  Pt will continue to benefit from hyrdotherapy to decrease bioburden and promote granulation of wound bed.   Wound Therapy - Functional Problem List: Decreased tolerance for positional changes Factors Delaying/Impairing Wound Healing: Altered sensation;Diabetes Mellitus;Incontinence;Infection - systemic/local;Immobility;Multiple medical problems;Polypharmacy Hydrotherapy Plan: Debridement;Dressing change;Patient/family education;Pulsatile lavage with suction Wound Therapy - Frequency: 6X / week Wound Therapy - Follow Up Recommendations: Skilled nursing facility Wound Plan: See above  Wound Therapy Goals- Improve the function of patient's integumentary system by progressing the wound(s) through the phases of wound healing (inflammation - proliferation - remodeling) by:    Goals will be updated until maximal potential achieved or discharge criteria met.  Discharge criteria: when goals achieved, discharge from hospital, MD decision/surgical intervention, no progress towards goals, refusal/missing three consecutive treatments without notification or medical reason.  GP     Debra [name] 08/23/2017, 5:57 PM  Governor Rooks, PTA pager [phone]

## 2017-08-23 NOTE — Clinical Social Work Note (Signed)
Clinical Social Work Assessment  Patient Details  Name: Debra [name] MRN: [id number] Date of Birth: Apr 27, 1965  Date of referral:  08/23/17               Reason for consult:  Discharge Planning                Permission sought to share information with:  Facility Medical sales representative, Family Supports Permission granted to share information::  No  Name::     [name]  Agency::  [facility]  Relationship::  Aunt  Contact Information:  [phone]  Housing/Transportation Living arrangements for the past 2 months:  Skilled Nursing Facility Source of Information:  Power of Attorney, Other (Comment Required)(Aunt) Patient Interpreter Needed:  None Criminal Activity/Legal Involvement Pertinent to Current Situation/Hospitalization:  No - Comment as needed Significant Relationships:  Adult Children, Other Family Members Lives with:  Facility Resident Do you feel safe going back to the place where you live?  Yes Need for family participation in patient care:  Yes (Comment)  Care giving concerns:  CSW received consult regarding discharge planning. Patient is disoriented. CSW spoke with patient's Aunt/HCPOA and she stated that patient has been residing at [facility] under long term care and will return at discharge. CSW continuing to follow for discharge needs.   Social Worker assessment / plan:  CSW spoke with patient's Aunt regarding return to [facility].  Employment status:  Disabled (Comment on whether or not currently receiving Disability) Insurance information:  Medicaid [place] PT Recommendations:  No Follow Up Information / Referral to community resources:  Skilled Nursing Facility  Patient/Family's Response to care:  Patient's Aunt states agreement with discharge planning of back to [facility]. Patient will need PTAR/transportation.   Patient/Family's Understanding of and Emotional Response to Diagnosis, Current Treatment, and Prognosis:  Patient Aunt is  realistic regarding her niece medical needs and is hopeful that her niece will get out of the hospital soon. Patient's Aunt expressed she understands CSWs role in the discharging process. The Aunt had no questions regarding medical treatment or plan.  Emotional Assessment Appearance:  Appears older than stated age Attitude/Demeanor/Rapport:  Unable to Assess Affect (typically observed):  Unable to Assess Orientation:  Oriented to Self Alcohol / Substance use:  Not Applicable Psych involvement (Current and /or in the community):  No (Comment)  Discharge Needs  Concerns to be addressed:  Care Coordination Readmission within the last 30 days:  Yes Current discharge risk:  None Barriers to Discharge:  Continued Medical Work up   [employer], LCSWA 08/23/2017, 11:07 AM

## 2017-08-23 NOTE — Plan of Care (Signed)
Patient is making progress, abt treatments continues , pain controlled with prn pain medication, no acute distress noted.

## 2017-08-24 DIAGNOSIS — B370 Candidal stomatitis: Secondary | ICD-10-CM

## 2017-08-24 DIAGNOSIS — E46 Unspecified protein-calorie malnutrition: Secondary | ICD-10-CM

## 2017-08-24 DIAGNOSIS — F331 Major depressive disorder, recurrent, moderate: Secondary | ICD-10-CM

## 2017-08-24 DIAGNOSIS — J189 Pneumonia, unspecified organism: Secondary | ICD-10-CM

## 2017-08-24 DIAGNOSIS — Z7189 Other specified counseling: Secondary | ICD-10-CM

## 2017-08-24 DIAGNOSIS — J029 Acute pharyngitis, unspecified: Secondary | ICD-10-CM

## 2017-08-24 LAB — DIC (DISSEMINATED INTRAVASCULAR COAGULATION) PANEL
D DIMER QUANT: 2.18 ug{FEU}/mL — AB (ref 0.00–0.50)
FIBRINOGEN: 287 mg/dL (ref 210–475)
INR: 1.34
PLATELETS: 27 10*3/uL — AB (ref 150–400)
PROTHROMBIN TIME: 16.7 s — AB (ref 11.4–15.2)
PROTHROMBIN TIME: 16.5 s — AB (ref 11.4–15.2)

## 2017-08-24 LAB — CBC
HCT: 26.4 % — ABNORMAL LOW (ref 36.0–46.0)
HEMOGLOBIN: 8.4 g/dL — AB (ref 12.0–15.0)
MCH: 27.7 pg (ref 26.0–34.0)
MCHC: 31.8 g/dL (ref 30.0–36.0)
MCV: 87.1 fL (ref 78.0–100.0)
Platelets: 27 10*3/uL — CL (ref 150–400)
RBC: 3.03 MIL/uL — ABNORMAL LOW (ref 3.87–5.11)
RDW: 18.1 % — ABNORMAL HIGH (ref 11.5–15.5)
WBC: 4.9 10*3/uL (ref 4.0–10.5)

## 2017-08-24 LAB — DIC (DISSEMINATED INTRAVASCULAR COAGULATION)PANEL
D-Dimer, Quant: 1.89 ug/mL-FEU — ABNORMAL HIGH (ref 0.00–0.50)
Fibrinogen: 264 mg/dL (ref 210–475)
INR: 1.37
Platelets: 30 10*3/uL — ABNORMAL LOW (ref 150–400)
Smear Review: NONE SEEN
Smear Review: NONE SEEN
aPTT: 120 seconds — ABNORMAL HIGH (ref 24–36)
aPTT: 106 seconds — ABNORMAL HIGH (ref 24–36)

## 2017-08-24 LAB — BASIC METABOLIC PANEL
Anion gap: 6 (ref 5–15)
BUN: 10 mg/dL (ref 6–20)
CHLORIDE: 119 mmol/L — AB (ref 98–111)
CO2: 18 mmol/L — ABNORMAL LOW (ref 22–32)
Calcium: 7.3 mg/dL — ABNORMAL LOW (ref 8.9–10.3)
Creatinine, Ser: 1.09 mg/dL — ABNORMAL HIGH (ref 0.44–1.00)
GFR calc non Af Amer: 58 mL/min — ABNORMAL LOW (ref >60)
Glucose, Bld: 193 mg/dL — ABNORMAL HIGH (ref 70–99)
POTASSIUM: 3.6 mmol/L (ref 3.5–5.1)
SODIUM: 143 mmol/L (ref 135–145)

## 2017-08-24 LAB — GLUCOSE, CAPILLARY
GLUCOSE-CAPILLARY: 174 mg/dL — AB (ref 70–99)
GLUCOSE-CAPILLARY: 214 mg/dL — AB (ref 70–99)
Glucose-Capillary: 252 mg/dL — ABNORMAL HIGH (ref 70–99)

## 2017-08-24 LAB — HEPATIC FUNCTION PANEL
ALBUMIN: 1.1 g/dL — AB (ref 3.5–5.0)
ALT: 6 U/L (ref 0–44)
AST: 23 U/L (ref 15–41)
Alkaline Phosphatase: 581 U/L — ABNORMAL HIGH (ref 38–126)
Bilirubin, Direct: 0.1 mg/dL (ref 0.0–0.2)
Indirect Bilirubin: 0.6 mg/dL (ref 0.3–0.9)
TOTAL PROTEIN: 4.1 g/dL — AB (ref 6.5–8.1)
Total Bilirubin: 0.7 mg/dL (ref 0.3–1.2)

## 2017-08-24 LAB — SAVE SMEAR

## 2017-08-24 LAB — HIV ANTIBODY (ROUTINE TESTING W REFLEX): HIV Screen 4th Generation wRfx: NONREACTIVE

## 2017-08-24 MED ORDER — MODAFINIL 100 MG PO TABS
100.0000 mg | ORAL_TABLET | Freq: Every day | ORAL | Status: DC
  Administered 2017-08-28 – 2017-09-03 (×3): 100 mg via ORAL
  Filled 2017-08-24 (×7): qty 1

## 2017-08-24 MED ORDER — POTASSIUM CHLORIDE CRYS ER 20 MEQ PO TBCR: 40.0000 meq | EXTENDED_RELEASE_TABLET | Freq: Once | ORAL | Status: DC

## 2017-08-24 NOTE — Progress Notes (Signed)
OT Cancellation Note  Patient Details Name: Debra [name] MRN: [id number] DOB: [birth year]   Cancelled Treatment:    Reason Eval/Treat Not Completed: OT screened, no acute needs identified, will sign off. Continue to recommend nursing perform PROM with pt. Defer all other OT need to SNF.     Diona Browner OTS   Diona Browner 08/24/2017, 10:18 AM

## 2017-08-24 NOTE — Progress Notes (Addendum)
SLP Cancellation Note  Patient Details Name: Debra [name] MRN: [id number] DOB: 21-Dec-1965   Cancelled treatment:       Reason Eval/Treat Not Completed: Other (comment) Pt with other provider. Will f/u as able.  UPDATE: Returned to try to see pt but she declines all PO intake at this time. Will continue efforts.   Maxcine Ham 08/24/2017, 2:09 PM  Maxcine Ham, M.A. CCC-SLP [phone]

## 2017-08-24 NOTE — Progress Notes (Signed)
Subjective:  No acute events overnight. Patient complaining of sore throat this morning. Refused to open eyes to interact with team. No other complaints this morning.   Objective:  Vital signs in last 24 hours: Vitals:   08/23/17 0800 08/23/17 1200 08/23/17 1600 08/23/17 2100  BP: (!) 148/84 (!) 166/87 (!) 154/88 (!) 157/84  Pulse: 77 77 79   Resp: 10 11 10 11   Temp:      TempSrc:      SpO2: 100% 100% 100%   Weight:      Height:       Physical Exam  Constitutional:  Frail, malnourished, chronically ill appearing female laying in bed in no acute distress. Would not open eyes this morning. Shaked head and nods when answering to questions.   Cardiovascular: Normal rate and regular rhythm. Exam reveals no gallop and no friction rub.  Murmur heard. Pulmonary/Chest: Effort normal. No respiratory distress.  Abdominal: Soft. Bowel sounds are normal. She exhibits no distension. There is no tenderness.  Musculoskeletal: She exhibits edema (bilateral UE edema unchanged from yesterday).  Neurological:  Patient alert but refused to open eyes for team    Assessment/Plan:  Principal Problem:   Lobar pneumonia, unspecified organism [facility]) Active Problems:   Type 1 diabetes mellitus (HCC)   Hypertension   Multiple sclerosis (HCC)   Neurogenic bladder   Osteomyelitis of coccyx (HCC)   Sepsis (HCC)   Sacral decubitus ulcer, stage IV (HCC)   Healthcare-associated pneumonia  # Bilateral pneumonia: Mental status unchanged from yesterday and affect remains depressed. Would not open eyes when seen, but nodding and shaking head when answering to questions. Respiratory status remains stable. Today is the last day of meropenem, total of 7 days. ID has signed off. Her appetite continues to be very poor and she is currently on D5 1/2 NS. Hopefully palliative care will see today for GOC discussion.  - Palliative care consulted, appreciate assistance  - Meropenem x 7 days. Currently on Day7. -  Will need repeat CXR in 6 weeks   # Sore throat: Patient complaining of sore throat x 2 days. However, difficult to examine as she does not fully open her mouth. I did appreciate thrush this morning. Nystatin solution was started yesterday which she reports is helping. Will continue to monitor.   # Thrombocytopenia: Platelet count has continue to trend down, 27 this AM. She had bloody oozing from decub ulcer yesterday but no profuse bleeding and her Hgb has remained at baseline. Linezolid stopped 2 days ago. This could still be induced by linezolid, but will order further work up as below for further evaluation.  - Daily CBC to monitor platelet count  - Follow up blood smear, hepatic function panel,  HIV, and DIC panel (low suspicion) - SQ heparin discontinued, SCDs on   # Bilateral UE swelling: No DVT on [place]. She does have evidence of acute superficial venous thrombosis in the R cephalic vein. Still suspect this is secondary to  IVF and third spacing in the setting of low albumin. Will continue to monitor.   # Chronic sacral ulcer and osteomyelitis: On hydrotherapy.   # CKD stage ME:BRAXENMM UOP.Renal function at baseline. Will continue to monitor.  - Strict I/Os   - Continue to monitor renal function   # T1DM:CBGs at goal with no episodes of hypoglycemia. - Lantus 3 units QHS  - SSI-S + CBG monitoring   # MS:  - Continue home baclofen 5 mg TID  - Dilaudid 1  mg q4h PRN + oxycodone 10 mg q6h PRN for chronic pain    Dispo: Anticipated discharge in approximately  day(s).   [name], MD 08/24/2017, 10:26 AM Pager: [phone]

## 2017-08-24 NOTE — Progress Notes (Signed)
HydroTherapy Discharge Patient Details Name: Ronnie Waldroup MRN: [id number] DOB: 21-Apr-1965 Today's Date: 08/24/2017 Time:  -     Hydrotherapy services discontinued secondary to decline in platelets and risk of increased bleeding of sacral wound with hydrotherapy - will need to re-order hyrotherapy to resume hydrotherapy services.  GP     [name] 08/24/2017, 1:35 PM   [employer] PT [phone]

## 2017-08-24 NOTE — Progress Notes (Signed)
CRITICAL VALUE ALERT  Critical Value: platelets 27  Date & Time Notied:  0720  Provider Notified: Evelene Croon   Orders Received/Actions taken: heparin discontinued

## 2017-08-24 NOTE — Progress Notes (Signed)
PT Cancellation Note  Patient Details Name: Debra [name] MRN: [id number] DOB: Oct 09, 1965   Cancelled Treatment:    Reason Eval/Treat Not Completed: PT screened, no needs identified, will sign off. Pt was evaluated by PT on 08/19/17. Please see that note for details.   [name] 08/24/2017, 1:34 PM [employer] PT [phone]

## 2017-08-25 DIAGNOSIS — F331 Major depressive disorder, recurrent, moderate: Secondary | ICD-10-CM

## 2017-08-25 DIAGNOSIS — B9689 Other specified bacterial agents as the cause of diseases classified elsewhere: Secondary | ICD-10-CM

## 2017-08-25 DIAGNOSIS — I82611 Acute embolism and thrombosis of superficial veins of right upper extremity: Secondary | ICD-10-CM

## 2017-08-25 DIAGNOSIS — N390 Urinary tract infection, site not specified: Secondary | ICD-10-CM

## 2017-08-25 DIAGNOSIS — Z9114 Patient's other noncompliance with medication regimen: Secondary | ICD-10-CM

## 2017-08-25 DIAGNOSIS — R627 Adult failure to thrive: Secondary | ICD-10-CM

## 2017-08-25 DIAGNOSIS — Z1612 Extended spectrum beta lactamase (ESBL) resistance: Secondary | ICD-10-CM

## 2017-08-25 LAB — CBC
HCT: 23.8 % — ABNORMAL LOW (ref 36.0–46.0)
HEMOGLOBIN: 7.6 g/dL — AB (ref 12.0–15.0)
MCH: 27.9 pg (ref 26.0–34.0)
MCHC: 31.9 g/dL (ref 30.0–36.0)
MCV: 87.5 fL (ref 78.0–100.0)
Platelets: 30 10*3/uL — ABNORMAL LOW (ref 150–400)
RBC: 2.72 MIL/uL — AB (ref 3.87–5.11)
RDW: 17.7 % — ABNORMAL HIGH (ref 11.5–15.5)
WBC: 4.7 10*3/uL (ref 4.0–10.5)

## 2017-08-25 LAB — GLUCOSE, CAPILLARY
GLUCOSE-CAPILLARY: 137 mg/dL — AB (ref 70–99)
GLUCOSE-CAPILLARY: 231 mg/dL — AB (ref 70–99)
Glucose-Capillary: 124 mg/dL — ABNORMAL HIGH (ref 70–99)
Glucose-Capillary: 145 mg/dL — ABNORMAL HIGH (ref 70–99)
Glucose-Capillary: 182 mg/dL — ABNORMAL HIGH (ref 70–99)

## 2017-08-25 LAB — BASIC METABOLIC PANEL
BUN: 8 mg/dL (ref 6–20)
CHLORIDE: 122 mmol/L — AB (ref 98–111)
CO2: 20 mmol/L — ABNORMAL LOW (ref 22–32)
Calcium: 7.2 mg/dL — ABNORMAL LOW (ref 8.9–10.3)
Creatinine, Ser: 1.12 mg/dL — ABNORMAL HIGH (ref 0.44–1.00)
GFR calc non Af Amer: 56 mL/min — ABNORMAL LOW (ref >60)
Glucose, Bld: 165 mg/dL — ABNORMAL HIGH (ref 70–99)
POTASSIUM: 3.1 mmol/L — AB (ref 3.5–5.1)
Sodium: 144 mmol/L (ref 135–145)

## 2017-08-25 LAB — HEPATIC FUNCTION PANEL
ALK PHOS: 537 U/L — AB (ref 38–126)
ALT: 5 U/L (ref 0–44)
AST: 21 U/L (ref 15–41)
Bilirubin, Direct: <0.1 mg/dL (ref 0.0–0.2)
TOTAL PROTEIN: 3.7 g/dL — AB (ref 6.5–8.1)
Total Bilirubin: 0.7 mg/dL (ref 0.3–1.2)

## 2017-08-25 LAB — PHOSPHORUS: Phosphorus: 1.9 mg/dL — ABNORMAL LOW (ref 2.5–4.6)

## 2017-08-25 MED ORDER — POTASSIUM CHLORIDE 10 MEQ/100ML IV SOLN
10.0000 meq | INTRAVENOUS | Status: AC
  Administered 2017-08-25 (×5): 10 meq via INTRAVENOUS
  Filled 2017-08-25 (×5): qty 100

## 2017-08-25 MED ORDER — MAGNESIUM SULFATE 2 GM/50ML IV SOLN
2.0000 g | Freq: Once | INTRAVENOUS | Status: AC
Start: 2017-08-25 — End: 2017-08-25
  Administered 2017-08-25: 2 g via INTRAVENOUS
  Filled 2017-08-25: qty 50

## 2017-08-25 MED ORDER — SODIUM CHLORIDE 0.9% FLUSH
10.0000 mL | INTRAVENOUS | Status: DC | PRN
  Administered 2017-09-03: 10 mL
  Filled 2017-08-25: qty 40

## 2017-08-25 MED ORDER — POTASSIUM PHOSPHATES 15 MMOLE/5ML IV SOLN
20.0000 mmol | Freq: Once | INTRAVENOUS | Status: AC
  Administered 2017-08-25: 20 mmol via INTRAVENOUS
  Filled 2017-08-25: qty 6.67

## 2017-08-25 MED ORDER — TERIFLUNOMIDE 14 MG PO TABS
14.0000 mg | ORAL_TABLET | Freq: Every day | ORAL | Status: DC
  Administered 2017-08-28 – 2017-08-30 (×3): 14 mg via ORAL
  Administered 2017-09-04: 14 via ORAL
  Administered 2017-09-05: 14 mg via ORAL
  Filled 2017-08-25 (×15): qty 1

## 2017-08-25 NOTE — Consult Note (Signed)
Consultation Note Date: 08/25/2017   Patient Name: Debra [name]  DOB: 14-Dec-1965  MRN: [id number]  Age / Sex: 52 y.o., female  PCP: Aldine Contes, MD Referring Physician: Aldine Contes, MD  Reason for Consultation: Establishing goals of care  HPI/Patient Profile: 52 y.o. female  with past medical history of MS (resulting in neurogenic bladder, loss of use of BLE, weak BUE, painful lower extremities, dysphagia), depression, osteomyelitis of the coccyx (s/p prior surigical debridements- per surg consult may need more), aortic atherosclerosis, DM w/ hx of DKA, recurrent UTI's w/ sepsis,  admitted on 08/18/2017 with sepsis- chest xray indicating LLL pneumonia. Palliative medicine consulted for [place].   Clinical Assessment and Goals of Care: Patient is familiar to me from previous consult dated 5/7. She is also followed outpatient by [facility]- Palliative Medicine. They had discussed the option of Hospice with patient on 6/5, however, at that time, her Johnson maintained that patient continued to desire feeding tube and to be hospitalized for treatment with IV antibiotics for life sustaining support.    I met at bedside with Debra [name] and her Debra [name] (who is also her HCPOA). Carolin kept her eyes closed at first, she was able to tell me her name, and that she was at [facility]. She denied pain. When requested she did open her eyes.   I had [place] meeting with Debra [name] and Debra [name]. Reviewed patient's Advanced Directives with patient that maintain that she does wish to be maintained on a ventilator and have a feeding tube. She confirms that these are still her directives.   We discussed CODE STATUS. Debra [name] was able to set a limit in that IF SHE IS FOUND WITHOUT A PULSE AND NOT BREATHING- SHE DOES NOT WANT RESUSCITATION.  Debra [name] called Zaidy's daughter Debra [name] and I discussed Vayda's wishes with Debra [name] as well.    However, she wants full treatment, including feeding tube and intubation, up to that point.   Primary Decision Maker HCPOA- with input from patient     SUMMARY OF RECOMMENDATIONS -Continue current level of care -PLEASE RESTART Ventress was very helpful last admission in increasing patient's [name] and communication, her pain regimen is also important -Feeding tube placement if indicated -IF PATIENT IS FOUND TO HAVE DIED AND NOT TO HAVE A PULSE AND RESPIRATIONS- SHE DOES NOT DESIRE RESUSCITATION- HOWEVER, IF SHE HAS A PULSE AND IS STILL BREATHING AND REQUIRES INTUBATION FOR DECREASED RESPIRATORY STATUS, SHE WOULD LIKE TO BE INTUBATED- THIS IS VERY IMPORTANT TO HER  -Please refer back to outpatient Palliative when discharged- she is followed by [facility] Palliative -PMT will continue to shadow and intervene if needed- please call if further assistance is requested   Code Status/Advance Care Planning:  Limited code  Palliative Prophylaxis:   Frequent Pain Assessment  Additional Recommendations (Limitations, Scope, Preferences):  Full Scope Treatment  Prognosis:    Unable to determine  Discharge Planning: [place] for rehab with Palliative care service follow-up  Primary Diagnoses: Present on Admission: . Sepsis (  [place]) . Type 1 diabetes mellitus ([place]) . Hypertension . Neurogenic bladder . Multiple sclerosis ([place]) . 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 Sacral decubitus ulcer, stage IV ([place])   I have reviewed the medical record, interviewed the patient and family, and examined the patient. The following aspects are pertinent.  Past Medical History:  Diagnosis Date  . Adenomatous colonic polyps   . Anemia    2005  . Anxiety    1990  . Arthritis    "knees, hands" (05/07/2017)  . Asthma    2000  . Benign essential HTN   . Cataract   . Chronic lower back pain   . Decubital ulcer   . Depression with psychotic symptoms    . Diabetic ketoacidosis without coma associated with type 1 diabetes mellitus ([place])   . Difficult intubation    narrow airway  . Fibromyalgia 11/07/2016  . Gastroesophageal Reflux Disease (GERD)   . Heart murmur    Birth  . Hyperlipidemia    2005  . Hypertension    1998  . Internal hemorrhoids 04/24/06   on colonoscopy  . Migraine    "used to have them all the time; now maybe q6 months" (05/07/2017)  . Multiple sclerosis ([place])   . Neuropathic bladder   . Neuropathy of the hands & feet   . Restless Leg Syndrome   . Sepsis due to urinary tract infection ([place]) 04/12/2017  . Sleep paralysis   . Stable angina pectoris    2007: cath showing normal cors.   . Stroke 1990   denies residual on 05/07/2017  . Type I Diabetes Mellitus 1988   Social History   Socioeconomic History  . Marital status: Divorced    Spouse name: Not on file  . Number of children: 2  . Years of education: 58  . Highest education level: Not on file  Occupational History  . Occupation: Unemployed  Social Needs  . Financial resource strain: Not on file  . Food insecurity:    Worry: Not on file    Inability: Not on file  . Transportation needs:    Medical: Not on file    Non-medical: Not on file  Tobacco Use  . Smoking status: Never Smoker  . Smokeless tobacco: Never Used  Substance and Sexual Activity  . Alcohol use: No    Alcohol/week: 0.0 oz  . Drug use: No    Comment: drug addict  . Sexual activity: Not Currently    Birth control/protection: Other-see comments  Lifestyle  . Physical activity:    Days per week: Not on file    Minutes per session: Not on file  . Stress: Not on file  Relationships  . Social connections:    Talks on phone: Not on file    Gets together: Not on file    Attends religious service: Not on file    Active member of club or organization: Not on file    Attends meetings of clubs or organizations: Not on file    Relationship status: Not on file  Other Topics Concern   . Not on file  Social History Narrative   Lost medicaid about 2009 ish when her youngest child turned 18. Has adult children. Lives with boyfriend who financially supports her. Has attempted to get disability but has been turned down.      2-3 caffeine drinks daily    Family History  Problem Relation Age of Onset  . Hypertension Mother   . Kidney disease Mother   .  Hypertension Father   . Breast cancer Maternal Grandmother   . Prostate cancer Maternal Grandfather   . Ovarian cancer Paternal Grandmother   . Prostate cancer Paternal Grandfather   . Colon cancer Maternal Uncle        Family history of malignant neoplasm of gastrointestinal tract   Scheduled Meds: . baclofen  5 mg Oral TID  . chlorhexidine  15 mL Mouth Rinse BID  . collagenase   Topical Daily  . famotidine  20 mg Oral Daily  . feeding supplement (ENSURE ENLIVE)  237 mL Oral TID WC  . insulin aspart  0-9 Units Subcutaneous TID WC  . insulin glargine  3 Units Subcutaneous QHS  . mouth rinse  15 mL Mouth Rinse q12n4p  . modafinil  100 mg Oral Daily  . mupirocin ointment  1 application Nasal BID  . nutrition supplement (JUVEN)  1 packet Oral BID BM  . nystatin  5 mL Oral QID  . sodium chloride flush  3 mL Intravenous Q12H   Continuous Infusions: . sodium chloride 10 mL/hr at 08/22/17 1800  . sodium chloride 10 mL/hr at 08/22/17 1124  . dextrose 5 % and 0.45% NaCl 50 mL/hr at 08/25/17 0105   PRN Meds:.acetaminophen **OR** acetaminophen, hydrALAZINE, HYDROmorphone (DILAUDID) injection, ondansetron **OR** ondansetron (ZOFRAN) IV, oxyCODONE, silver nitrate applicators, sodium chloride flush Medications Prior to Admission:  Prior to Admission medications   Medication Sig Start Date End Date Taking? Authorizing Provider  Amino Acids-Protein Hydrolys (FEEDING SUPPLEMENT, PRO-STAT SUGAR FREE 64,) LIQD Take 30 mLs by mouth 3 (three) times daily between meals. 07/05/17  Yes Velna Ochs, MD  amLODipine (NORVASC) 10 MG  tablet Take 1 tablet (10 mg total) by mouth daily. 02/14/17 02/14/18 Yes Katherine Roan, MD  aspirin EC 81 MG tablet Take 1 tablet (81 mg total) by mouth daily. 02/14/17  Yes Katherine Roan, MD  atorvastatin (LIPITOR) 40 MG tablet Take 1 tablet (40 mg total) by mouth daily. Patient taking differently: Take 40 mg by mouth at bedtime.  02/14/17  Yes Katherine Roan, MD  Baclofen 5 MG TABS Take 5 mg by mouth 3 (three) times daily. 04/24/17  Yes Mariel Aloe, MD  bethanechol (URECHOLINE) 50 MG tablet Take 1 tablet (50 mg total) by mouth 3 (three) times daily. 02/14/17  Yes Katherine Roan, MD  doxycycline (VIBRAMYCIN) 50 MG capsule Take 2 capsules (100 mg total) by mouth 2 (two) times daily. Starting 6/17 Patient taking differently: Take 100 mg by mouth 2 (two) times daily. Starting 6/18 for about 4 weeks (end 7/18) 08/13/17 11/11/17 Yes Colbert Ewing, MD  DULoxetine (CYMBALTA) 30 MG capsule Take 1 capsule (30 mg total) by mouth daily. 07/05/17  Yes Velna Ochs, MD  ferrous sulfate 325 (65 FE) MG tablet Take 325 mg by mouth daily with breakfast.   Yes [provider]  Heparin Lock Flush (HEPARIN FLUSH, PORCINE,) 100 UNIT/ML injection 500 Units by Intracatheter route every 8 (eight) hours. Use for IV ABT using SASH method   Yes [provider]  insulin glargine (LANTUS) 100 UNIT/ML injection Inject 0.05 mLs (5 Units total) into the skin at bedtime. 07/05/17  Yes Velna Ochs, MD  Lactobacillus (ACIDOPHILUS) 100 MG CAPS Take 100 mg by mouth 2 (two) times daily.   Yes [provider]  lidocaine (LIDODERM) 5 % Place 1 patch onto the skin daily. FOR LOWER BACK PAIN Remove & Discard patch within 12 hours or as directed by MD   Yes [provider]  lisinopril (PRINIVIL,ZESTRIL) 20 MG tablet Take 1 tablet (20 mg total) by mouth daily. 02/15/17  Yes Katherine Roan, MD  metoprolol succinate (TOPROL-XL) 100 MG 24 hr tablet Take 1 tablet (100 mg total)  by mouth daily. Take with or immediately following a meal. 02/15/17  Yes Winfrey, Jenne Pane, MD  mirtazapine (REMERON) 45 MG tablet Take 1 tablet (45 mg total) by mouth at bedtime. 04/24/17  Yes Mariel Aloe, MD  modafinil (PROVIGIL) 100 MG tablet Take 1 tablet (100 mg total) by mouth every morning. Patient taking differently: Take 100 mg by mouth daily.  07/05/17  Yes Velna Ochs, MD  Multiple Vitamin (MULTIVITAMIN) tablet Take 1 tablet by mouth daily.   Yes [provider]  Nutritional Supplements (ENSURE PO) Take 237 mLs by mouth 3 (three) times daily.   Yes [provider]  ondansetron (ZOFRAN) 4 MG tablet Take 4 mg by mouth daily.   Yes [provider]  pantoprazole (PROTONIX) 40 MG tablet Take 1 tablet (40 mg total) by mouth daily. 02/15/17  Yes Katherine Roan, MD  tamsulosin (FLOMAX) 0.4 MG CAPS capsule Take 1 capsule (0.4 mg total) daily by mouth. Patient taking differently: Take 0.4 mg by mouth daily after supper.  01/03/17  Yes Gouru, Illene Silver, MD  Teriflunomide 14 MG TABS Take 14 mg by mouth daily.   Yes [provider]  Vitamin D, Ergocalciferol, (DRISDOL) 50000 units CAPS capsule Take 1 capsule (50,000 Units total) by mouth every 7 (seven) days. Patient taking differently: Take 50,000 Units by mouth every 7 (seven) days. On Friday 02/15/17  Yes Katherine Roan, MD  acetaminophen (TYLENOL) 325 MG tablet Take 650 mg by mouth every 6 (six) hours as needed for fever.    [provider]  collagenase (SANTYL) ointment Apply topically 2 (two) times daily. Patient not taking: Reported on 06/26/2017 05/25/17   Zada Finders, MD  feeding supplement, GLUCERNA SHAKE, (GLUCERNA SHAKE) LIQD Take 237 mLs by mouth 2 (two) times daily between meals. Patient not taking: Reported on 08/18/2017 02/14/17   Katherine Roan, MD  glucagon (GLUCAGON EMERGENCY) 1 MG injection Inject 1 mg into the muscle once as needed (Hypoglycemia).    [provider]  HYDROmorphone (DILAUDID) 2 MG tablet Take 1 mg by mouth every 8 (eight) hours as needed for severe pain.    [provider]  loperamide (IMODIUM) 2 MG capsule Take 2 capsules (4 mg total) by mouth as needed for diarrhea or loose stools. 04/24/17   Mariel Aloe, MD  magnesium hydroxide (MILK OF MAGNESIA) 400 MG/5ML suspension Take 30 mLs by mouth daily as needed for mild constipation. 02/14/17   Katherine Roan, MD  meclizine (ANTIVERT) 12.5 MG tablet Take 12.5 mg by mouth every 8 (eight) hours as needed (vertigo).    [provider]  polyethylene glycol (MIRALAX / GLYCOLAX) packet Take 17 g by mouth daily as needed for mild constipation, moderate constipation or severe constipation. 02/14/17   Katherine Roan, MD   Allergies  Allergen Reactions  . Penicillins Anaphylaxis, Nausea And Vomiting and Rash    Has patient had a PCN reaction causing immediate rash, facial/tongue/throat swelling, SOB or lightheadedness with hypotension: Yes Has patient had a PCN reaction causing severe rash involving mucus membranes or skin necrosis: Yes Has patient had a PCN reaction that required hospitalization No Has patient had a PCN reaction occurring within the last 10 years: Yes If all of the above answers  are "NO", then may proceed with Cephalosporin use.   . Lactose Intolerance (Gi) Other (See Comments)  . Pollen Extract Other (See Comments)    Seasonal Allergies  . Tape Rash   Review of Systems  Constitutional: Positive for appetite change and fatigue.  Psychiatric/Behavioral: Positive for dysphoric [name] and sleep disturbance.    Physical Exam  Constitutional: She appears well-developed.  HENT:  Head: Normocephalic and atraumatic.  Cardiovascular: Normal rate.  Edema noted in her face  Pulmonary/Chest: Effort normal.  Abdominal: Soft.  Musculoskeletal: She exhibits edema.  Upper and lower extremity weakness/paralysis, diffuse anasarca  Neurological: She is alert.    Oriented to person and place  Skin: There is pallor.  Psychiatric:  Depressed affect, tearful at times  Nursing note and vitals reviewed.   Vital Signs: BP 119/89   Pulse 81   Temp 97.6 F (36.4 C) (Axillary)   Resp (!) 8   Ht '5\' 3"'$  (1.6 m)   Wt 63.1 kg (139 lb 1.8 oz)   SpO2 100%   BMI 24.64 kg/m  Pain Scale: 0-10   Pain Score: 0-No pain   SpO2: SpO2: 100 % O2 Device:SpO2: 100 % O2 Flow Rate: .O2 Flow Rate (L/min): 3 L/min  IO: Intake/output summary:   Intake/Output Summary (Last 24 hours) at 08/25/2017 0806 Last data filed at 08/25/2017 0500 Gross per 24 hour  Intake 3885.68 ml  Output 675 ml  Net 3210.68 ml    LBM: Last BM Date: 08/21/17 Baseline Weight: Weight: 54.4 kg (120 lb) Most recent weight: Weight: 63.1 kg (139 lb 1.8 oz)     Palliative Assessment/Data: PPS:      Thank you for this consult. Palliative medicine will continue to follow and assist as needed.   Time In: 1300 Time Out: 1415 Time Total: 75 minutes Greater than 50%  of this time was spent counseling and coordinating care related to the above assessment and plan.  Signed by: Mariana Kaufman, AGNP-C Palliative Medicine    Please contact Palliative Medicine Team phone at [phone] for questions and concerns.  For individual provider: See Shea Evans

## 2017-08-25 NOTE — ACP (Advance Care Planning) (Signed)
Advanced Care Planning Note:   I had GOC meeting with Debra [name] and Debra [name]. Reviewed patient's Advanced Directives with patient that maintain that she does wish to be maintained on a ventilator and have a feeding tube. She confirms that these are still her directives.   We discussed CODE STATUS. Debra [name] was able to set a limit in that IF SHE IS FOUND WITHOUT A PULSE AND NOT BREATHING- SHE DOES NOT WANT RESUSCITATION.   HOWEVER- SHE WANTS FULL SCOPE TREATMENT- INCLUDING FEEDING TUBE UP TO THAT POINT.   [name], AGNP-C Palliative Medicine  Please call Palliative Medicine team phone with any questions [phone]. For individual providers please see AMION.

## 2017-08-25 NOTE — Progress Notes (Addendum)
Subjective: Patient not very interactive this morning; laying in bed with eyes open and occasionally shaking head yes or no to questions. She has been refusing medications but would not disclose why. Apparently had agreed to take her pain medication last night and this morning, but refused this mornings dose when brought to patient per RN.   Palliative care met with Pt and Aunt [facility]) yesterday to discuss [place]. Apparently was able to express her desires in regards to advance care planning. They decided on an interesting code status: Patient wants full scope of care (including feeding tube) and is OK with intubation but not in a resuscitative effort. Namely, if she is found without a pulse or not breathing, she does NOT want resuscitation.   Objective:  Vital signs in last 24 hours: Vitals:   08/25/17 0441 08/25/17 0800 08/25/17 0900 08/25/17 1000  BP: 119/89     Pulse: 81 73 74 71  Resp: (!) 8 13 (!) 7 (!) 0  Temp:      TempSrc:      SpO2: 100% 100% 100% 100%  Weight:      Height:       Physical Exam  Constitutional:  Frail, malnourished, chronically ill appearing female laying in bed in no acute distress. Opens eyes when spoken to. Shaked head and nods when answering to questions although sometimes does not respond. Expresses she does not want to take medications but would not elaborate.   Cardiovascular: Normal rate and regular rhythm. Exam reveals no friction rub.  Murmur heard. Pulmonary/Chest: Effort normal and breath sounds normal. No respiratory distress.  Abdominal: Soft. Bowel sounds are normal. She exhibits no distension. There is no tenderness.  Musculoskeletal:  Extremities warm, perfused. SCDs in place.   Neurological:  Alert, occasionally nods or shakes head to questions.   Psychiatric:  Flat affect. Withdrawn.    Assessment/Plan: This is a chronically-ill 52 year-old F with advanced MS, DM1 and MDD admitted 08/18/17 with sepsis with multiple infectious  sources (PNA, chronic sacral osteomyelitis w/ decubitus ulcer, and ESBL UTI), thrombocytopenia and failure to thrive at her facility. Required pressor support in ICU on admission but remains in SDU since that time. Completed 7-days of Meropenem 6/28. Linezolid DC 6/26 per ID recommendations and worsening thrombocytopenia. Hospital course complicated by significant thrombocytopenia, patients refusal to eat or take PO medications and ongoing goals of care discussion.   Principal Problem:   Lobar pneumonia, unspecified organism [facility]) Active Problems:   Type 1 diabetes mellitus (HCC)   Hypertension   Multiple sclerosis (HCC)   Neurogenic bladder   Osteomyelitis of coccyx (HCC)   Sepsis ([place])   Sacral decubitus ulcer, stage IV ([place])   Healthcare-associated pneumonia   Moderate episode of recurrent major depressive disorder ([place])  #Encephalopathy #Depression #Goals of Care Provigil resumed after discussing care with previous provider who noted improvement in her mental status after restarting this during prior admission. Remains quite withdrawn on examination and is apparently refusing PO medications. Pt does not express why she's refusing these. Interestingly, Pt and Aunt (HCPOA) had meeting with Palliative Care and Pt expressed desire for full scope of care (although refuses resuscitative efforts). Her refusal of medications does not seem consistent with desire for full-scope.  -Consulted Psychiatry for evaluation -Appreciate RN and her continued encouragement  #HCAP #Chronic Sacral Osteomyelitis and Ulceration #ESBL UTI Completed 7-days of Meropenem yesterday. Afebrile and without leukocytosis. She is saturating well on RA. Receiving hydrotherapy for sacral wound however unable to complete  yesterday due to oozing related to thrombocytopenia -Follow WBC and Fever trends -Hydrotherapy when able (thrombocytopenia) -Will need repeat CXR in 6-weeks  #Advanced Multiple Sclerosis #Neurogenic  Bladder with Chronic Foley #Dysphagia Patient bed-bound at baseline prior to admission. Was on Teriflunomide '14mg'$  daily prior to admission. This was held 6/22 when she was transferred to ICU for pressor support.  -Resumed Teriflunomide -Continue Baclofen '5mg'$  TID -Continue Oxycodone and Dilaudid prn for chronic pain -Consider Neurology consultation   #Sore throat: Pt noted sore throat for several days which apparently was improved with Nystatin solution. Denies sore throat today and refused nystatin this AM.   #Thrombocytopenia: Had normal BL previously but Plts 65,000 on admission. Thought to be related to sepsis but patient also started on Linezolid which could have been contributing to her gradual decline which has been discontinued. Plt count 30,000 this morning, up from 27,000 yesterday. DIC panel reassuring with normal fibrinogen and slight improvement on repeat. Some oozing reported from sacral decub yesterday but otherwise no overt sources of blood loss. -Daily CBC to monitor platelet count  -Continue to hold SQ heparin, SCDs in place  #Malnutrition Albumin 1.1 on admission, <1 today. Day 7 of admission and continues to have little to no PO intake. Continues to refuse supplementation recommended by nutritionist. Brita Romp had a steady decline in albumin over the past year. Apparently PEG tube is within patients wishes.  -Of note, she does have a working PICC; could consider starting TPN vs surgical re-consult -She is at high-risk for refeeding syndrome -K 3.1 this morning; replacing this and Mag -Follow-up Phos; replace as indicated  #Bilateral UE swelling: She does have evidence of acute superficial venous thrombosis in the R cephalic vein. Will continue to monitor.   #T1DM:CBGs at goal with no episodes of hypoglycemia. - Lantus 3 units QHS  - SSI-S + CBG monitoring   Dispo: Anticipated discharge in several days.   Wana Mount, DO 08/25/2017, 10:52 AM Pager: [phone]

## 2017-08-25 NOTE — Consult Note (Signed)
I am unable to evaluate patient for capacity to refuse medication. All attempts made to interview patient including asking for the help of her  staff nurse were unsuccessful. Patient just refused to talk or cooperate. Recommendation: Please contact patient's family to enquire if patient has a Health care power of attorney who can make medical decision on her behalf until she agrees to capacity evaluation.  Thedore Mins, MD Attending psychiatrist.

## 2017-08-26 DIAGNOSIS — R601 Generalized edema: Secondary | ICD-10-CM

## 2017-08-26 DIAGNOSIS — F329 Major depressive disorder, single episode, unspecified: Secondary | ICD-10-CM

## 2017-08-26 DIAGNOSIS — D61818 Other pancytopenia: Secondary | ICD-10-CM

## 2017-08-26 LAB — CBC WITH DIFFERENTIAL/PLATELET
Abs Immature Granulocytes: 0 10*3/uL (ref 0.0–0.1)
BASOS PCT: 1 %
Basophils Absolute: 0 10*3/uL (ref 0.0–0.1)
EOS ABS: 0.2 10*3/uL (ref 0.0–0.7)
Eosinophils Relative: 6 %
HCT: 23.2 % — ABNORMAL LOW (ref 36.0–46.0)
Hemoglobin: 7.3 g/dL — ABNORMAL LOW (ref 12.0–15.0)
IMMATURE GRANULOCYTES: 0 %
LYMPHS ABS: 1.6 10*3/uL (ref 0.7–4.0)
Lymphocytes Relative: 46 %
MCH: 27.4 pg (ref 26.0–34.0)
MCHC: 31.5 g/dL (ref 30.0–36.0)
MCV: 87.2 fL (ref 78.0–100.0)
Monocytes Absolute: 0.1 10*3/uL (ref 0.1–1.0)
Monocytes Relative: 4 %
NEUTROS PCT: 43 %
Neutro Abs: 1.4 10*3/uL — ABNORMAL LOW (ref 1.7–7.7)
PLATELETS: 30 10*3/uL — AB (ref 150–400)
RBC: 2.66 MIL/uL — ABNORMAL LOW (ref 3.87–5.11)
RDW: 17.7 % — AB (ref 11.5–15.5)
WBC: 3.4 10*3/uL — ABNORMAL LOW (ref 4.0–10.5)

## 2017-08-26 LAB — COMPREHENSIVE METABOLIC PANEL
ANION GAP: 4 — AB (ref 5–15)
AST: 19 U/L (ref 15–41)
Albumin: <1 g/dL — ABNORMAL LOW (ref 3.5–5.0)
Alkaline Phosphatase: 534 U/L — ABNORMAL HIGH (ref 38–126)
BUN: 7 mg/dL (ref 6–20)
CALCIUM: 7.3 mg/dL — AB (ref 8.9–10.3)
CHLORIDE: 119 mmol/L — AB (ref 98–111)
CO2: 18 mmol/L — ABNORMAL LOW (ref 22–32)
CREATININE: 1.08 mg/dL — AB (ref 0.44–1.00)
GFR, EST NON AFRICAN AMERICAN: 59 mL/min — AB (ref >60)
Glucose, Bld: 259 mg/dL — ABNORMAL HIGH (ref 70–99)
Potassium: 4.3 mmol/L (ref 3.5–5.1)
Sodium: 141 mmol/L (ref 135–145)
Total Bilirubin: 0.8 mg/dL (ref 0.3–1.2)
Total Protein: 3.9 g/dL — ABNORMAL LOW (ref 6.5–8.1)

## 2017-08-26 LAB — GLUCOSE, CAPILLARY
GLUCOSE-CAPILLARY: 225 mg/dL — AB (ref 70–99)
GLUCOSE-CAPILLARY: 184 mg/dL — AB (ref 70–99)
GLUCOSE-CAPILLARY: 144 mg/dL — AB (ref 70–99)
Glucose-Capillary: 147 mg/dL — ABNORMAL HIGH (ref 70–99)

## 2017-08-26 LAB — MAGNESIUM
MAGNESIUM: 1.3 mg/dL — AB (ref 1.7–2.4)
Magnesium: 2.1 mg/dL (ref 1.7–2.4)

## 2017-08-26 LAB — PHOSPHORUS: Phosphorus: 3 mg/dL (ref 2.5–4.6)

## 2017-08-26 MED ORDER — INSULIN GLARGINE 100 UNIT/ML ~~LOC~~ SOLN
5.0000 [IU] | Freq: Every day | SUBCUTANEOUS | Status: DC
  Administered 2017-08-26: 5 [IU] via SUBCUTANEOUS
  Filled 2017-08-26 (×2): qty 0.05

## 2017-08-26 MED ORDER — FUROSEMIDE 10 MG/ML IJ SOLN
20.0000 mg | Freq: Once | INTRAMUSCULAR | Status: AC
  Administered 2017-08-26: 20 mg via INTRAVENOUS
  Filled 2017-08-26: qty 2

## 2017-08-26 MED ORDER — MAGNESIUM SULFATE 4 GM/100ML IV SOLN
4.0000 g | Freq: Once | INTRAVENOUS | Status: AC
  Administered 2017-08-26: 4 g via INTRAVENOUS
  Filled 2017-08-26: qty 100

## 2017-08-26 MED ORDER — FLUCONAZOLE 40 MG/ML PO SUSR
200.0000 mg | Freq: Every day | ORAL | Status: DC
  Filled 2017-08-26 (×2): qty 5

## 2017-08-26 NOTE — Progress Notes (Signed)
Subjective:  No acute events overnight. Patient more interactive this morning and answering questions. Reports pain with swallowing and Nystatin swish and swallow not helping. Unfortunately, continues to refuse both PO and IV medications.   Objective:  Vital signs in last 24 hours: Vitals:   08/25/17 2243 08/26/17 0242 08/26/17 0300 08/26/17 0600  BP: (!) 144/70 (!) 143/105  (!) 164/109  Pulse: 82     Resp: 18 10  (!) 9  Temp:  97.6 F (36.4 C)    TempSrc:      SpO2: 100%  100%   Weight:      Height:       Physical Exam  Constitutional: She is oriented to person, place, and time.  Frail, chronically ill appearing female laying in bed in no acute distress. She is more interactive this morning and answering questions appropriately   Cardiovascular: Normal rate and regular rhythm. Exam reveals no gallop and no friction rub.  Murmur heard. Pulmonary/Chest: Effort normal. No respiratory distress.  Abdominal: Soft. Bowel sounds are normal. She exhibits no distension. There is no tenderness.  Musculoskeletal: She exhibits edema (Anasarca noted. Worsening facial edema and new b/l LE pitting edema ).  Neurological: She is alert and oriented to person, place, and time.    Assessment/Plan:  Principal Problem:   Lobar pneumonia, unspecified organism (HCC) Active Problems:   Type 1 diabetes mellitus (HCC)   Hypertension   Multiple sclerosis (HCC)   Neurogenic bladder   Osteomyelitis of coccyx (HCC)   Sepsis (HCC)   Sacral decubitus ulcer, stage IV (HCC)   Healthcare-associated pneumonia   Moderate episode of recurrent major depressive disorder (HCC)  #Depression #Goals of Care Provigil resumed after discussing care with previous provider who noted improvement in her mental status after restarting this during prior admission. She is more interactive and verbal this morning but continues to refuse medications. States she is having difficulty and pain with swallowing, but also  refusing IV medications. Pt and Aunt (HCPOA) had meeting with Palliative Care and Pt expressed desire for full scope of care (she does refuses resuscitative efforts if she is found without a pulse or not breathing). Psychiatry consulted yesterday for further recommendations regarding patient's history of depression, but unable to evaluate patient as she refused to talk or cooperate.  - Appreciate RN and her continued encouragement - HCPOA: [name] [phone]   # Pancytopenia: Patient initially presented with thrombocytopenia which is new since admission. This was thought to be secondary to sepsis and worsened by linezolid, which she has been off of x 4 days with no improvement in platelet count. This AM she is pancytopenic: WBC 3.4, Hgb 7.4 (baseline), and Plt 30K. No signs/symptoms of active bleeding at this time. Blood smear ordered 6/28, awaiting review from pathologist. DIC panel negative.   - Daily CBC to monitor platelet count  - Follow up blood smear  - Continue to hold SQ heparin, SCDs in place  # Malnutrition # Anasarca Patient with bilateral UE edema that is unchanged from prior exams but with worsening facial edema and new bilateral LE pitting edema. Vascular US did show an acute superficial venous thrombus in R cephalic. However, her anasarca is likely due to severe hypoalbuminemia in the setting of malnutrition due to odynophagia and dysphagia. She does have thrush on exam and concern for esophageal candidiasis. HIV negative 6/28. Will continue to monitor lytes as below as she is at high risk for refeeding syndrome.  - Diflucan solution 200 mg QD  for x 14 days  - Continue Nystatin swish and swallow  - IV Lasix 20 mg x1, patient refused  - Monitoring electrolytes, phos, and mag   # HCAP # Chronic Sacral Osteomyelitis and Ulceration Completed 7-days of Meropenem. Afebrile and without leukocytosis. She is saturating well on RA. - Hydrotherapy when able (thrombocytopenia) -  Will need repeat CXR in 6-weeks  # T1DM:Hyperglycemic with CBG in the 200s. Has not received any sliding scale insulin since 6/28 due to no PO intake. Increasing Lantus as below. - Lantus 3--> 5 units QHS  - SSI-S only if patient eats > 50% of meal  - CBG monitoring   # Advanced Multiple Sclerosis # Neurogenic Bladder with Chronic Indwelling Foley Catheter  Patient bed-bound at baseline prior to admission. Was on Teriflunomide 14mg  daily prior to admission. This was held 6/22 when she was transferred to ICU for pressor support.  - Continue home Teriflunomide - Continue Baclofen 5mg  TID - Continue Oxycodone and Dilaudid prn for chronic pain   Dispo: Anticipated discharge in approximately 3-5 day(s).   [name], MD 08/26/2017, 9:18 AM Pager: [phone]

## 2017-08-27 LAB — CBC WITH DIFFERENTIAL/PLATELET
Abs Immature Granulocytes: 0 10*3/uL (ref 0.0–0.1)
Basophils Absolute: 0 10*3/uL (ref 0.0–0.1)
Basophils Relative: 1 %
EOS ABS: 0.2 10*3/uL (ref 0.0–0.7)
EOS PCT: 7 %
HEMATOCRIT: 21.7 % — AB (ref 36.0–46.0)
Hemoglobin: 7.1 g/dL — ABNORMAL LOW (ref 12.0–15.0)
Immature Granulocytes: 1 %
Lymphocytes Relative: 53 %
Lymphs Abs: 1.8 10*3/uL (ref 0.7–4.0)
MCH: 28.2 pg (ref 26.0–34.0)
MCHC: 32.7 g/dL (ref 30.0–36.0)
MCV: 86.1 fL (ref 78.0–100.0)
MONO ABS: 0.2 10*3/uL (ref 0.1–1.0)
MONOS PCT: 5 %
NEUTROS ABS: 1.2 10*3/uL — AB (ref 1.7–7.7)
NEUTROS PCT: 34 %
Platelets: 33 10*3/uL — ABNORMAL LOW (ref 150–400)
RBC: 2.52 MIL/uL — AB (ref 3.87–5.11)
RDW: 17.2 % — AB (ref 11.5–15.5)
WBC: 3.4 10*3/uL — ABNORMAL LOW (ref 4.0–10.5)

## 2017-08-27 LAB — COMPREHENSIVE METABOLIC PANEL
ALT: 5 U/L (ref 0–44)
ANION GAP: 3 — AB (ref 5–15)
AST: 15 U/L (ref 15–41)
Albumin: <1 g/dL — ABNORMAL LOW (ref 3.5–5.0)
Alkaline Phosphatase: 514 U/L — ABNORMAL HIGH (ref 38–126)
BILIRUBIN TOTAL: 0.6 mg/dL (ref 0.3–1.2)
BUN: 8 mg/dL (ref 6–20)
CALCIUM: 7.2 mg/dL — AB (ref 8.9–10.3)
CO2: 19 mmol/L — ABNORMAL LOW (ref 22–32)
Chloride: 118 mmol/L — ABNORMAL HIGH (ref 98–111)
Creatinine, Ser: 0.91 mg/dL (ref 0.44–1.00)
Glucose, Bld: 164 mg/dL — ABNORMAL HIGH (ref 70–99)
Potassium: 3.7 mmol/L (ref 3.5–5.1)
Sodium: 140 mmol/L (ref 135–145)
TOTAL PROTEIN: 3.7 g/dL — AB (ref 6.5–8.1)

## 2017-08-27 LAB — GAMMA GT: GGT: 127 U/L — AB (ref 7–50)

## 2017-08-27 LAB — MAGNESIUM: Magnesium: 1.9 mg/dL (ref 1.7–2.4)

## 2017-08-27 LAB — GLUCOSE, CAPILLARY
GLUCOSE-CAPILLARY: 66 mg/dL — AB (ref 70–99)
GLUCOSE-CAPILLARY: 88 mg/dL (ref 70–99)
Glucose-Capillary: 136 mg/dL — ABNORMAL HIGH (ref 70–99)
Glucose-Capillary: 91 mg/dL (ref 70–99)
Glucose-Capillary: 47 mg/dL — ABNORMAL LOW (ref 70–99)
Glucose-Capillary: 114 mg/dL — ABNORMAL HIGH (ref 70–99)

## 2017-08-27 LAB — PHOSPHORUS: PHOSPHORUS: 2.4 mg/dL — AB (ref 2.5–4.6)

## 2017-08-27 LAB — APTT: aPTT: 101 s — ABNORMAL HIGH (ref 24–36)

## 2017-08-27 MED ORDER — ARGATROBAN 50 MG/50ML IV SOLN
0.3100 ug/kg/min | INTRAVENOUS | Status: DC
  Administered 2017-08-27: 1 ug/kg/min via INTRAVENOUS
  Filled 2017-08-27 (×3): qty 50

## 2017-08-27 MED ORDER — SODIUM CHLORIDE 0.9 % IV SOLN: 250.0000 mL | INTRAVENOUS | Status: DC | PRN

## 2017-08-27 MED ORDER — FLUCONAZOLE 200 MG PO TABS
200.0000 mg | ORAL_TABLET | Freq: Every day | ORAL | Status: DC
  Filled 2017-08-27: qty 1
  Filled 2017-08-27 (×2): qty 2
  Filled 2017-08-27: qty 1

## 2017-08-27 MED ORDER — SODIUM CHLORIDE 0.9% FLUSH: 3.0000 mL | INTRAVENOUS | Status: DC | PRN

## 2017-08-27 MED ORDER — DEXTROSE 50 % IV SOLN
INTRAVENOUS | Status: AC
  Administered 2017-08-27: 50 mL
  Filled 2017-08-27: qty 50

## 2017-08-27 MED ORDER — SODIUM CHLORIDE 0.9% FLUSH
3.0000 mL | Freq: Two times a day (BID) | INTRAVENOUS | Status: DC
  Administered 2017-08-28: 3 mL via INTRAVENOUS

## 2017-08-27 NOTE — Progress Notes (Addendum)
ANTICOAGULATION CONSULT NOTE - Initial Consult  Pharmacy Consult for argatroban Indication: r/o HIT  Allergies  Allergen Reactions  . Penicillins Anaphylaxis, Nausea And Vomiting and Rash    Has patient had a PCN reaction causing immediate rash, facial/tongue/throat swelling, SOB or lightheadedness with hypotension: Yes Has patient had a PCN reaction causing severe rash involving mucus membranes or skin necrosis: Yes Has patient had a PCN reaction that required hospitalization No Has patient had a PCN reaction occurring within the last 10 years: Yes If all of the above answers are "NO", then may proceed with Cephalosporin use.   . Lactose Intolerance (Gi) Other (See Comments)  . Pollen Extract Other (See Comments)    Seasonal Allergies  . Tape Rash    Patient Measurements: Height: 5\' 3"  (160 cm) Weight: 143 lb 11.8 oz (65.2 kg) IBW/kg (Calculated) : 52.4  Vital Signs: Temp: 98.4 F (36.9 C) (07/01 1351) BP: 138/93 (07/01 1351) Pulse Rate: 72 (07/01 1351)  Labs: Recent Labs    08/25/17 0529 08/26/17 0331 08/27/17 0324  HGB 7.6* 7.3* 7.1*  HCT 23.8* 23.2* 21.7*  PLT 30* 30* 33*  CREATININE 1.12* 1.08* 0.91    Estimated Creatinine Clearance: 67.1 mL/min (by C-G formula based on SCr of 0.91 mg/dL).   Medical History: Past Medical History:  Diagnosis Date  . Adenomatous colonic polyps   . Anemia    2005  . Anxiety    1990  . Arthritis    "knees, hands" (05/07/2017)  . Asthma    2000  . Benign essential HTN   . Cataract   . Chronic lower back pain   . Decubital ulcer   . Depression with psychotic symptoms   . Diabetic ketoacidosis without coma associated with type 1 diabetes mellitus (HCC)   . Difficult intubation    narrow airway  . Fibromyalgia 11/07/2016  . Gastroesophageal Reflux Disease (GERD)   . Heart murmur    Birth  . Hyperlipidemia    2005  . Hypertension    1998  . Internal hemorrhoids 04/24/06   on colonoscopy  . Migraine    "used to  have them all the time; now maybe q6 months" (05/07/2017)  . Multiple sclerosis (HCC)   . Neuropathic bladder   . Neuropathy of the hands & feet   . Restless Leg Syndrome   . Sepsis due to urinary tract infection (HCC) 04/12/2017  . Sleep paralysis   . Stable angina pectoris    2007: cath showing normal cors.   . Stroke 1990   denies residual on 05/07/2017  . Type I Diabetes Mellitus 1988    Medications:  Medications Prior to Admission  Medication Sig Dispense Refill Last Dose  . Amino Acids-Protein Hydrolys (FEEDING SUPPLEMENT, PRO-STAT SUGAR FREE 64,) LIQD Take 30 mLs by mouth 3 (three) times daily between meals. 900 mL 0 08/18/2017 at Unknown time  . amLODipine (NORVASC) 10 MG tablet Take 1 tablet (10 mg total) by mouth daily. 90 tablet 1 08/18/2017 at Unknown time  . aspirin EC 81 MG tablet Take 1 tablet (81 mg total) by mouth daily. 90 tablet 3 08/17/2017 at Unknown time  . atorvastatin (LIPITOR) 40 MG tablet Take 1 tablet (40 mg total) by mouth daily. (Patient taking differently: Take 40 mg by mouth at bedtime. ) 90 tablet 3 08/17/2017 at Unknown time  . Baclofen 5 MG TABS Take 5 mg by mouth 3 (three) times daily. 5 tablet 0 08/18/2017 at Unknown time  . bethanechol (URECHOLINE) 50  MG tablet Take 1 tablet (50 mg total) by mouth 3 (three) times daily.   08/18/2017 at Unknown time  . doxycycline (VIBRAMYCIN) 50 MG capsule Take 2 capsules (100 mg total) by mouth 2 (two) times daily. Starting 6/17 (Patient taking differently: Take 100 mg by mouth 2 (two) times daily. Starting 6/18 for about 4 weeks (end 7/18)) 360 capsule 0 08/18/2017 at 0900  . DULoxetine (CYMBALTA) 30 MG capsule Take 1 capsule (30 mg total) by mouth daily.  3 08/18/2017 at Unknown time  . ferrous sulfate 325 (65 FE) MG tablet Take 325 mg by mouth daily with breakfast.   08/18/2017 at Unknown time  . Heparin Lock Flush (HEPARIN FLUSH, PORCINE,) 100 UNIT/ML injection 500 Units by Intracatheter route every 8 (eight) hours. Use for IV  ABT using SASH method   Past Week at Unknown time  . insulin glargine (LANTUS) 100 UNIT/ML injection Inject 0.05 mLs (5 Units total) into the skin at bedtime. 10 mL 11 08/17/2017 at Unknown time  . Lactobacillus (ACIDOPHILUS) 100 MG CAPS Take 100 mg by mouth 2 (two) times daily.   08/18/2017 at Unknown time  . lidocaine (LIDODERM) 5 % Place 1 patch onto the skin daily. FOR LOWER BACK PAIN Remove & Discard patch within 12 hours or as directed by MD   08/18/2017 at Unknown time  . lisinopril (PRINIVIL,ZESTRIL) 20 MG tablet Take 1 tablet (20 mg total) by mouth daily.   08/18/2017 at Unknown time  . metoprolol succinate (TOPROL-XL) 100 MG 24 hr tablet Take 1 tablet (100 mg total) by mouth daily. Take with or immediately following a meal. 90 tablet 0 08/17/2017 at 1800  . mirtazapine (REMERON) 45 MG tablet Take 1 tablet (45 mg total) by mouth at bedtime.   08/17/2017 at Unknown time  . modafinil (PROVIGIL) 100 MG tablet Take 1 tablet (100 mg total) by mouth every morning. (Patient taking differently: Take 100 mg by mouth daily. )   08/18/2017 at Unknown time  . Multiple Vitamin (MULTIVITAMIN) tablet Take 1 tablet by mouth daily.   08/18/2017 at Unknown time  . Nutritional Supplements (ENSURE PO) Take 237 mLs by mouth 3 (three) times daily.   08/18/2017 at Unknown time  . ondansetron (ZOFRAN) 4 MG tablet Take 4 mg by mouth daily.   08/18/2017 at Unknown time  . pantoprazole (PROTONIX) 40 MG tablet Take 1 tablet (40 mg total) by mouth daily.   08/18/2017 at Unknown time  . tamsulosin (FLOMAX) 0.4 MG CAPS capsule Take 1 capsule (0.4 mg total) daily by mouth. (Patient taking differently: Take 0.4 mg by mouth daily after supper. ) 30 capsule  08/17/2017 at Unknown time  . Teriflunomide 14 MG TABS Take 14 mg by mouth daily.   08/18/2017 at Unknown time  . Vitamin D, Ergocalciferol, (DRISDOL) 50000 units CAPS capsule Take 1 capsule (50,000 Units total) by mouth every 7 (seven) days. (Patient taking differently: Take 50,000  Units by mouth every 7 (seven) days. On Friday) 30 capsule  08/14/2017  . acetaminophen (TYLENOL) 325 MG tablet Take 650 mg by mouth every 6 (six) hours as needed for fever.   Unk  . collagenase (SANTYL) ointment Apply topically 2 (two) times daily. (Patient not taking: Reported on 06/26/2017) 15 g 0 Not Taking at Unknown time  . feeding supplement, GLUCERNA SHAKE, (GLUCERNA SHAKE) LIQD Take 237 mLs by mouth 2 (two) times daily between meals. (Patient not taking: Reported on 08/18/2017)  0 Not Taking at Unknown time  . glucagon (GLUCAGON  EMERGENCY) 1 MG injection Inject 1 mg into the muscle once as needed (Hypoglycemia).   Unk  . HYDROmorphone (DILAUDID) 2 MG tablet Take 1 mg by mouth every 8 (eight) hours as needed for severe pain.   Unk  . loperamide (IMODIUM) 2 MG capsule Take 2 capsules (4 mg total) by mouth as needed for diarrhea or loose stools.   Unk  . magnesium hydroxide (MILK OF MAGNESIA) 400 MG/5ML suspension Take 30 mLs by mouth daily as needed for mild constipation. 360 mL 0 Unk  . meclizine (ANTIVERT) 12.5 MG tablet Take 12.5 mg by mouth every 8 (eight) hours as needed (vertigo).   Unk  . polyethylene glycol (MIRALAX / GLYCOLAX) packet Take 17 g by mouth daily as needed for mild constipation, moderate constipation or severe constipation. 14 each 0 Unk    Assessment: 52 y/o female with advanced MS admitted on 08/18/17 with sepsis. Of note, she was thrombocytopenic on admit (platelets 65, were 173 on 07/20/17). Sepsis and linezolid initially thought to be cause but sepsis has resolved and linezolid was stopped 08/22/17. Heparin was stopped on 6/28 but no significant rise in platelets yet as they remain at 33. Pharmacy consulted to begin argatroban to r/o HIT. No bleeding noted, Hgb has been low since admit (7-8s). Renal function has improved, SCr 0.91. D-dimer was elevated at 1.89 on 08/24/17.  Goal of Therapy:  aPTT 50-90 seconds Monitor platelets by anticoagulation protocol: Yes   Plan:  -  Begin argatroban at 1 mcg/kg/min - 2 hr aPTT - Daily aPTT, CBC - Monitor for s/sx of bleeding - F/U HIT Ab    Loura Back, PharmD, BCPS Clinical Pharmacist Clinical phone for 08/27/2017 until 10p is [phone] Please check AMION for all Pharmacist numbers by unit 08/27/2017 5:36 PM

## 2017-08-27 NOTE — Progress Notes (Signed)
SLP Cancellation Note  Patient Details Name: Debra [name] MRN: [id number] DOB: 1965/07/04   Cancelled treatment:       Reason Eval/Treat Not Completed: Medical issues which prohibited therapy. Pt declines all POs offered due to reports of nausea. Will continue efforts.   Maxcine Ham 08/27/2017, 2:57 PM   Maxcine Ham, M.A. CCC-SLP [phone]

## 2017-08-27 NOTE — Progress Notes (Signed)
CSW to continue to follow for discharge needs.   Hendel Gatliff LCSW [phone]  

## 2017-08-27 NOTE — Progress Notes (Signed)
Argatroban running through R SL PICC. Per [facility], this should be a phlebotomist draw for APTT.

## 2017-08-27 NOTE — Progress Notes (Signed)
Spoke with MD Helberg in regards to pt requesting pain medicine with RR 9-12. Pt c/o 10/10 pain and nausea. MD at bedside and okay with PRN dilaudid and zofran, per [facility]. Will administer and continue to monitor closely.

## 2017-08-27 NOTE — Progress Notes (Signed)
Subjective: Patient seen and evaluated at bedside. Had severe pain in her legs overnight that was much improved with pain medication. She was more interactive today, giving brief answers to questions. States she is feeling fine. Does complain of some nausea and denies having any appetite. States her painful swallowing is improving.  Objective:  Vital signs in last 24 hours: Vitals:   08/26/17 1442 08/26/17 2042 08/27/17 0524 08/27/17 1351  BP: 133/83 134/78 125/88 (!) 138/93  Pulse: 77 76 73 72  Resp: (!) 9 (!) '9 12 18  '$ Temp: 97.9 F (36.6 C)  (!) 96 F (35.6 C) 98.4 F (36.9 C)  TempSrc: Oral  Axillary   SpO2: 99% 100% 100% 100%  Weight:   143 lb 11.8 oz (65.2 kg)   Height:       General: awake, alert, chronically ill appearing female lying in bed in no acute distress.  Cardiovascular: normal rate and regular rhythm. Holosystolic murmur heard at left and right sternal borders. No gallop or friction rub.  Pulmonary/chest: normal chest excursion. Lungs clear to auscultation in anterior fields. Abdominal: soft, non-tender. Bowel sounds hypoactive.  Psych: flat affect   Assessment/Plan:  Principal Problem:   Lobar pneumonia, unspecified organism ([place]) Active Problems:   Type 1 diabetes mellitus (HCC)   Hypertension   Multiple sclerosis (HCC)   Neurogenic bladder   Osteomyelitis of coccyx (HCC)   Sepsis (HCC)   Sacral decubitus ulcer, stage IV ([place])   Healthcare-associated pneumonia   Moderate episode of recurrent major depressive disorder ([place])  1. Depression #Goals of Care Provigil resumed after discussing care with previous provider who noted improvement in her mental status after restarting this during prior admission. She is more interactive and verbal this morning but continues to refuse medications. States she is having difficulty and pain with swallowing, but also refusing IV medications. Pt and Aunt (HCPOA) had meeting with Palliative Care and Pt expressed desire for  full scope of care (she does refuses resuscitative efforts if she is found without a pulse or not breathing). Psychiatry consulted 6/29 for further recommendations regarding patient's history of depression, but unable to evaluate patient as she refused to talk or cooperate. Consider re-consult since she appears to be more cooperative with answering questions.  - Appreciate RN and her continued encouragement - HCPOA: Jason Coop [phone]   2.  Pancytopenia: Patient initially presented with thrombocytopenia which is new since admission. This was thought to be secondary to sepsis and worsened by linezolid, which she has been off of x 4 days with no improvement in platelet count. This AM she is pancytopenic: WBC 3.4, Hgb 7.4 (baseline), and Plt 33K. No signs/symptoms of active bleeding at this time. Blood smear ordered 6/28, awaiting review from pathologist. DIC panel negative.   - Daily CBC to monitor platelet count  - Follow up blood smear  - Continue to holdSQ heparin, SCDsin place - consider checking antibodies for HIT; her risk is intermediate when calculating score.  - Start argatroban   3. Malnutrition Anasarca Patient with bilateral UE edema that is unchanged from prior exams but with worsening facial edema and new bilateral LE pitting edema. Vascular US did show an acute superficial venous thrombus in R cephalic. However, her anasarca is likely due to severe hypoalbuminemia in the setting of malnutrition due to odynophagia and dysphagia. She does have thrush on exam and concern for esophageal candidiasis. HIV negative 6/28. Will continue to monitor lytes as below as she is at high risk for  refeeding syndrome.  - Diflucan solution 200 mg QD for x 14 days  - Continue Nystatin swish and swallow  - IV Lasix 20 mg x1, patient refused  - Monitoring electrolytes, phos, and mag  - will continue Zofran prn to encourage her PO intake  4. HCAP Chronic Sacral Osteomyelitis and  Ulceration Completed 7-days of Meropenem. Afebrile and without leukocytosis. She is saturating well on RA. - Hydrotherapy when able (thrombocytopenia) - Will need repeat CXR in 6-weeks  5. T1DM:Blood sugars ranging from 90-180s. Has not received any sliding scale insulin since 6/28 due to no PO intake. Increasing Lantus as below. - Lantus 3--> 5 units QHS  - SSI-S only if patient eats > 50% of meal  - CBG monitoring   6. Advanced Multiple Sclerosis Neurogenic Bladder with Chronic Indwelling Foley Catheter  Patient bed-bound at baseline prior to admission. Was on Teriflunomide '14mg'$  daily prior to admission. This was held 6/22 when she was transferred to ICU for pressor support.  - Continue home Teriflunomide - Continue Baclofen '5mg'$  TID - Continue Oxycodone and Dilaudid prn for chronic pain  7. Elevated alk phos Liver enzymes normal; however, GGTP elevated at 127 which would indicate a hepatic etiology. Currently asymptomatic. Will consider work-up if changes clinically.        Dispo: Anticipated discharge in approximately 1-2 day(s).   Modena Nunnery D, DO 08/27/2017, 4:32 PM Pager: [phone]

## 2017-08-27 NOTE — Discharge Summary (Signed)
Name: Debra [name] MRN: [id number] DOB: 08/05/1965 52 y.o. PCP: [name], MD  Date of Admission: 08/18/2017  7:13 AM Date of Discharge: 09/07/2017 Attending Physician: Burns Spain, MD  Discharge Diagnosis: 1. HCAP 2. Thrombocytopenia  3. Malnutrition  4. Depression  5. T1DM 6. Chronic sacral ulcer  7. Chronic sacral osteomyelitis  8. Advanced multiple sclerosis 9. Odynophagia   Discharge Medications: Allergies as of 09/07/2017      Reactions   Penicillins Anaphylaxis, Nausea And Vomiting, Rash   Has patient had a PCN reaction causing immediate rash, facial/tongue/throat swelling, SOB or lightheadedness with hypotension: Yes Has patient had a PCN reaction causing severe rash involving mucus membranes or skin necrosis: Yes Has patient had a PCN reaction that required hospitalization No Has patient had a PCN reaction occurring within the last 10 years: Yes If all of the above answers are "NO", then may proceed with Cephalosporin use.   Lactose Intolerance (gi) Other (See Comments)   Pollen Extract Other (See Comments)   Seasonal Allergies   Tape Rash      Medication List    STOP taking these medications   Acidophilus 100 MG Caps   amLODipine 10 MG tablet Commonly known as:  NORVASC   atorvastatin 40 MG tablet Commonly known as:  LIPITOR   bethanechol 50 MG tablet Commonly known as:  URECHOLINE   collagenase ointment Commonly known as:  SANTYL   doxycycline 50 MG capsule Commonly known as:  VIBRAMYCIN   DULoxetine 30 MG capsule Commonly known as:  CYMBALTA   ENSURE PO   feeding supplement (GLUCERNA SHAKE) Liqd   feeding supplement (PRO-STAT SUGAR FREE 64) Liqd   ferrous sulfate 325 (65 FE) MG tablet   GLUCAGON EMERGENCY 1 MG injection Generic drug:  glucagon   heparin flush (porcine) 100 UNIT/ML injection   insulin glargine 100 UNIT/ML injection Commonly known as:  LANTUS   lidocaine 5 % Commonly known as:  LIDODERM     lisinopril 20 MG tablet Commonly known as:  PRINIVIL,ZESTRIL   loperamide 2 MG capsule Commonly known as:  IMODIUM   magnesium hydroxide 400 MG/5ML suspension Commonly known as:  MILK OF MAGNESIA   meclizine 12.5 MG tablet Commonly known as:  ANTIVERT   metoprolol succinate 100 MG 24 hr tablet Commonly known as:  TOPROL-XL   mirtazapine 45 MG tablet Commonly known as:  REMERON   modafinil 100 MG tablet Commonly known as:  PROVIGIL   multivitamin tablet   pantoprazole 40 MG tablet Commonly known as:  PROTONIX   polyethylene glycol packet Commonly known as:  MIRALAX / GLYCOLAX   tamsulosin 0.4 MG Caps capsule Commonly known as:  FLOMAX   Teriflunomide 14 MG Tabs   Vitamin D (Ergocalciferol) 50000 units Caps capsule Commonly known as:  DRISDOL     TAKE these medications   acetaminophen 325 MG tablet Commonly known as:  TYLENOL Take 650 mg by mouth every 6 (six) hours as needed for fever.   aspirin EC 81 MG tablet Take 1 tablet (81 mg total) by mouth daily.   Baclofen 5 MG Tabs Take 5 mg by mouth 3 (three) times daily.   glycopyrrolate 1 MG tablet Commonly known as:  ROBINUL Take 1 tablet (1 mg total) by mouth every 4 (four) hours as needed (excessive secretions).   glycopyrrolate 0.2 MG/ML injection Commonly known as:  ROBINUL Inject 1 mL (0.2 mg total) into the skin every 4 (four) hours as needed (excessive secretions).   haloperidol  lactate 5 MG/ML injection Commonly known as:  HALDOL Inject 0.2 mLs (1 mg total) into the vein every 6 (six) hours.   HYDROmorphone 2 MG tablet Commonly known as:  DILAUDID Take 1 mg by mouth every 8 (eight) hours as needed for severe pain.   LORazepam 2 MG/ML injection Commonly known as:  ATIVAN Inject 0.5 mLs (1 mg total) into the vein every 4 (four) hours as needed for anxiety (or nausea).   ondansetron 4 MG tablet Commonly known as:  ZOFRAN Take 1 tablet (4 mg total) by mouth every 6 (six) hours as needed for  nausea. What changed:    when to take this  reasons to take this       Disposition and follow-up:   [name] was discharged from [facility] in Serious condition.  At the hospital follow up visit please address:  1.   Please provide comfort measures for end of life care.  2.  Labs / imaging needed at time of follow-up: none  3.  Pending labs/ test needing follow-up: None   Follow-up Appointments: n/a  Hospital Course by problem list:  Ms. Thiesse is a 52yo female with PMH of severe multiple sclerosis from which she is bedbound, chronic sacral ulcer which has been s/p debridement and multiple rounds of antibiotics, chronic indwelling foley, T1DM, HTN, severe MDD, and chronic malnutrition initially admitted to ICU at [facility] for HCAP requiring pressors but not intubation. After transfer to the floor she was managed for possible HIT, UTI, dysphagia and intractable nausea; due to progression of her comorbidities and decline of oral intake, discussion with family and patient was had and decision to transition to comfort care was initiated after she had not responded to adequate medical management. She is prognosticated to <2wks; she was subsequently transferred to [facility] Below is a summary of each problem addressed during her admission.  1. HCAP:  Patient presented in septic shock and was found to have a bilateral pneumonia. She required admission to the ICU for pressor support for 24 hours. She did not require intubation. She was initially started on linezolid and meropenem due to her history of VRE and ESBL Klebsiella. ID was consulted who recommended meropenem x 7 days for treatment of HCAP which she completed during this admission.  2. Thrombocytopenia: Patient was noted to have new thrombocytopenia on admission with PLT 65. This was thought to be secondary to sepsis. Her platelet count continue to trend down during admission which was though to be due to use  of Linezolid for HCAP treatment. This medication was discontinued on HD#3, however platelet count failed to improve and trended down to 27. Due to history of frequent hospitalizations with heparin administration for VTE prophylaxis and heparin administration this admission which coincided with platelet decrease, there was concern for HIT; after a couple of days on argatroban, pt's Hgb dropped one point to 6.5 requiring transfusion and she was found to have gross hematuria; argatroban was subsequently stopped. Her HIT antibodies were positive, but serotonin assay was negative. Due to other precluding factors, she was not restarted on anticoagulation. She required a total of 3 units of pRBC during her entire hospital stay intermittently.  3. Malnutrition: Patient has a long history of poor appetite that is thought to be secondary to severe depression. On admission, she was found to have an albumin of less than one and she also developed anasarca likely from hypoalbuminemia. She received low doses of IV Lasix as needed.  Acute malnutrition during this hospitalization was exacerbated by very poor PO intake and in the last week, no nutritional oral intake. She had complaints of odynophagia and intractable nausea which was not amenable to treatment with IV diflucan (since not taking PO pills very reliably), or scheduled antiemetics (including haldol and ativan) w/o improvement. The nausea was likely a progression of her MS. Due to poor intake she required IVF with dextrose and supplemental K to avoid hypoglycemia. Patient previously and during this admission was agreeable to enteral feeding via NG or PEG tubes, however in setting of poor prognostication, severe anasarca 2/2 malnutrition, and low platelet counts she was not a candidate to offer this procedure.   4. Depression: Previously treated with symptomatic improvement during previous admissions including a short stay at a behavioral health hospital. During this  admission, she largely declined PO medicines which was manifested as decreased interaction consistent with prior admissions.  5. T1DM: Patient was maintained on insulin therapy during hospitalization; due to malnutrition  6. Chronic sacral ulcer  7. Chronic sacral osteomyelitis  Patient noted to have significant eschar on admission and general surgery consulted for debridement. They recommended PT hydrotherapy which significantly improved wound. However, this had to be discontinued due to oozing of blood from wound in the setting of thrombocytopenia.   8. Advanced multiple sclerosis: Patient was diagnosed with MS in 10/2016 that is complicated by loss of bilateral LE function and bladder/bowel incontinence requiring a chronic indwelling catheter. She was continued on home Teriflunomide which she very infrequently took.   9. UTI: During hospitalization, patient had re-onset of hypothermia and leukopenia and was found to have klebsiella pneumoniae growing in her urine. It likely was a colonizer, however since her condition was deteriorating and we were still pursuing full scope of medical care, she was started on meropenem. This was stopped after no improvement in her clinical state.  Discharge Vitals:   BP 102/63 (BP Location: Left Arm)   Pulse 68   Temp (!) 96 F (35.6 C)   Resp 18   Ht 5\' 3"  (1.6 m)   Wt 137 lb (62.1 kg)   SpO2 100%   BMI 24.27 kg/m   Pertinent Labs, Studies, and Procedures:   CBC Latest Ref Rng & Units 09/03/2017 09/03/2017 09/02/2017  WBC 4.0 - 10.5 K/uL - 2.3(L) 2.0(L)  Hemoglobin 12.0 - 15.0 g/dL 1.6(X) 6.6(LL) 7.3(L)  Hematocrit 36.0 - 46.0 % 26.6(L) 20.7(L) 22.8(L)  Platelets 150 - 400 K/uL - 52(L) 59(L)   CMP Latest Ref Rng & Units 09/03/2017 09/02/2017 09/01/2017  Glucose 70 - 99 mg/dL 096(E) 83 454(U)  BUN 6 - 20 mg/dL <9(W) <1(X) <9(J)  Creatinine 0.44 - 1.00 mg/dL 4.78 2.95 6.21  Sodium 135 - 145 mmol/L 142 142 141  Potassium 3.5 - 5.1 mmol/L 3.9 4.2 4.9    Chloride 98 - 111 mmol/L 122(H) 119(H) 118(H)  CO2 22 - 32 mmol/L 18(L) 20(L) 20(L)  Calcium 8.9 - 10.3 mg/dL 7.5(L) 7.5(L) 7.4(L)  Total Protein 6.5 - 8.1 g/dL - - -  Total Bilirubin 0.3 - 1.2 mg/dL - - -  Alkaline Phos 38 - 126 U/L - - -  AST 15 - 41 U/L - - -  ALT 0 - 44 U/L - - -   HIV negative  Blood smear without schistocytes DIC panel negative x 2  TSH 6.26 Blood culture negative x 2   CXR 6/24: FINDINGS: Stable cardiomegaly. The hila and mediastinum are normal. A right PICC line terminates  in the SVC. Effusion with underlying opacity in the right base is new. Opacity in the left retrocardiac region is more pronounced in the interval. No other interval changes.  IMPRESSION: 1. Stable right PICC line as above. 2. The right layering effusion with underlying opacity in the right base was not seen on the previous study. 3. The opacity in the left base is more pronounced in the interval.  Discharge Instructions: Discharge Instructions    Diet - low sodium heart healthy   Complete by:  As directed    Increase activity slowly   Complete by:  As directed       Signed: Nyra Market, MD 09/07/2017, 12:33 PM

## 2017-08-27 NOTE — Progress Notes (Signed)
PTs daughter at bedside and states that she does not want her mother to be a partial code and that she would like to change her code status MD notified.

## 2017-08-27 NOTE — Progress Notes (Addendum)
ANTICOAGULATION CONSULT NOTE - Follow Up Consult  Pharmacy Consult for argatroban Indication: r/o HIT  Labs: Recent Labs    08/26/17 0331 08/27/17 0324 08/27/17 2112 08/28/17 0054  HGB 7.3* 7.1*  --  7.7*  HCT 23.2* 21.7*  --  24.0*  PLT 30* 33*  --  28*  APTT  --   --  101* 95*  CREATININE 1.08* 0.91  --  0.89    Assessment: 52yo female supratherapeutic on argatroban with initial dosing for suspected HIT; no gtt issues or signs of bleeding per RN.  Goal of Therapy:  aPTT 50-90 seconds   Plan:  Will decrease heparin gtt by 30% to 0.7 mcg/kg/min and check PTT in 2 hours.    Vernard Gambles, PharmD, BCPS  08/27/2017,11:00 PM   Addendum: aPTT remains above goal (95); RN notes scant amount of blood in foley, MD aware. Will decrease argatroban gtt by 20% to 0.56 mcg/kg/min and check PTT in 2 hours.   VB 08/28/2017 1:38 AM   Addendum: aPTT now higher at 103 sec; RN reports that argatroban infusion has been moved to PIV in LUE and lab was drawn from PICC in RUE; RN also notes that foley is now clear.  Will decrease argatroban gtt by 30% to 0.39 mcg/kg/min and check PTT in 2 hours.    VB 5:54 AM

## 2017-08-27 NOTE — Progress Notes (Signed)
Hypoglycemic Event  CBG: 47  Treatment: D50 IV 50 mL  Symptoms: None  Follow-up CBG: Time:1833 CBG Result:114  Possible Reasons for Event: Inadequate meal intake  Comments/MD notified: PT refusing anything PO at this time    Eligah East

## 2017-08-28 DIAGNOSIS — F332 Major depressive disorder, recurrent severe without psychotic features: Secondary | ICD-10-CM

## 2017-08-28 DIAGNOSIS — R110 Nausea: Secondary | ICD-10-CM

## 2017-08-28 LAB — GLUCOSE, CAPILLARY
GLUCOSE-CAPILLARY: 52 mg/dL — AB (ref 70–99)
GLUCOSE-CAPILLARY: 147 mg/dL — AB (ref 70–99)
GLUCOSE-CAPILLARY: 104 mg/dL — AB (ref 70–99)
GLUCOSE-CAPILLARY: 110 mg/dL — AB (ref 70–99)
GLUCOSE-CAPILLARY: 115 mg/dL — AB (ref 70–99)
Glucose-Capillary: 120 mg/dL — ABNORMAL HIGH (ref 70–99)
Glucose-Capillary: 144 mg/dL — ABNORMAL HIGH (ref 70–99)
Glucose-Capillary: 159 mg/dL — ABNORMAL HIGH (ref 70–99)
Glucose-Capillary: 162 mg/dL — ABNORMAL HIGH (ref 70–99)

## 2017-08-28 LAB — COMPREHENSIVE METABOLIC PANEL
ALBUMIN: 1.1 g/dL — AB (ref 3.5–5.0)
ALT: <5 U/L (ref 0–44)
ANION GAP: 6 (ref 5–15)
AST: 14 U/L — ABNORMAL LOW (ref 15–41)
Alkaline Phosphatase: 478 U/L — ABNORMAL HIGH (ref 38–126)
BILIRUBIN TOTAL: 0.6 mg/dL (ref 0.3–1.2)
BUN: 8 mg/dL (ref 6–20)
CO2: 18 mmol/L — ABNORMAL LOW (ref 22–32)
Calcium: 7.5 mg/dL — ABNORMAL LOW (ref 8.9–10.3)
Chloride: 117 mmol/L — ABNORMAL HIGH (ref 98–111)
Creatinine, Ser: 0.89 mg/dL (ref 0.44–1.00)
GLUCOSE: 65 mg/dL — AB (ref 70–99)
POTASSIUM: 3.6 mmol/L (ref 3.5–5.1)
Sodium: 141 mmol/L (ref 135–145)
TOTAL PROTEIN: 4 g/dL — AB (ref 6.5–8.1)

## 2017-08-28 LAB — CBC
HEMATOCRIT: 24 % — AB (ref 36.0–46.0)
Hemoglobin: 7.7 g/dL — ABNORMAL LOW (ref 12.0–15.0)
MCH: 27.6 pg (ref 26.0–34.0)
MCHC: 32.1 g/dL (ref 30.0–36.0)
MCV: 86 fL (ref 78.0–100.0)
Platelets: 28 10*3/uL — CL (ref 150–400)
RBC: 2.79 MIL/uL — ABNORMAL LOW (ref 3.87–5.11)
RDW: 16.9 % — AB (ref 11.5–15.5)
WBC: 3.7 10*3/uL — ABNORMAL LOW (ref 4.0–10.5)

## 2017-08-28 LAB — APTT
APTT: 124 s — AB (ref 24–36)
aPTT: 95 seconds — ABNORMAL HIGH (ref 24–36)
aPTT: 103 seconds — ABNORMAL HIGH (ref 24–36)
aPTT: 92 seconds — ABNORMAL HIGH (ref 24–36)
aPTT: 59 seconds — ABNORMAL HIGH (ref 24–36)

## 2017-08-28 LAB — PTH, INTACT AND CALCIUM
Calcium, Total (PTH): 7.4 mg/dL — ABNORMAL LOW (ref 8.7–10.2)
PTH: 36 pg/mL (ref 15–65)

## 2017-08-28 MED ORDER — ONDANSETRON HCL 4 MG/2ML IJ SOLN
4.0000 mg | Freq: Three times a day (TID) | INTRAMUSCULAR | Status: DC
  Administered 2017-08-28 – 2017-09-07 (×29): 4 mg via INTRAVENOUS
  Filled 2017-08-28 (×30): qty 2

## 2017-08-28 MED ORDER — PREMIER PROTEIN SHAKE
11.0000 [oz_av] | Freq: Two times a day (BID) | ORAL | Status: DC
  Administered 2017-08-29 – 2017-09-04 (×3): 11 [oz_av] via ORAL
  Filled 2017-08-28 (×21): qty 325.31

## 2017-08-28 MED ORDER — FUROSEMIDE 10 MG/ML IJ SOLN
40.0000 mg | Freq: Two times a day (BID) | INTRAMUSCULAR | Status: DC
  Administered 2017-08-28 (×2): 40 mg via INTRAVENOUS
  Filled 2017-08-28 (×2): qty 4

## 2017-08-28 MED ORDER — DEXTROSE 50 % IV SOLN
INTRAVENOUS | Status: AC
  Administered 2017-08-28: 50 mL via INTRAVENOUS
  Filled 2017-08-28: qty 50

## 2017-08-28 MED ORDER — ARGATROBAN 50 MG/50ML IV SOLN
0.2100 ug/kg/min | INTRAVENOUS | Status: DC
  Administered 2017-08-29: 0.21 ug/kg/min via INTRAVENOUS
  Filled 2017-08-28 (×3): qty 50

## 2017-08-28 MED ORDER — COSYNTROPIN 0.25 MG IJ SOLR
0.2500 mg | Freq: Once | INTRAMUSCULAR | Status: AC
  Administered 2017-08-29: 0.25 mg via INTRAVENOUS
  Filled 2017-08-28: qty 0.25

## 2017-08-28 MED ORDER — DEXTROSE 50 % IV SOLN
50.0000 mL | Freq: Once | INTRAVENOUS | Status: AC
  Administered 2017-08-28: 50 mL via INTRAVENOUS

## 2017-08-28 MED ORDER — MIRTAZAPINE 15 MG PO TABS
45.0000 mg | ORAL_TABLET | Freq: Every day | ORAL | Status: DC
  Administered 2017-08-28 – 2017-09-02 (×3): 45 mg via ORAL
  Filled 2017-08-28 (×9): qty 1

## 2017-08-28 MED ORDER — INSULIN ASPART 100 UNIT/ML ~~LOC~~ SOLN
0.0000 [IU] | SUBCUTANEOUS | Status: DC
  Administered 2017-08-28: 2 [IU] via SUBCUTANEOUS
  Administered 2017-08-29: 1 [IU] via SUBCUTANEOUS
  Administered 2017-08-29: 3 [IU] via SUBCUTANEOUS
  Administered 2017-08-29: 1 [IU] via SUBCUTANEOUS
  Administered 2017-08-29: 2 [IU] via SUBCUTANEOUS
  Administered 2017-08-30: 3 [IU] via SUBCUTANEOUS
  Administered 2017-08-30 (×2): 1 [IU] via SUBCUTANEOUS
  Administered 2017-08-31 (×2): 2 [IU] via SUBCUTANEOUS
  Administered 2017-08-31: 1 [IU] via SUBCUTANEOUS
  Administered 2017-08-31: 3 [IU] via SUBCUTANEOUS

## 2017-08-28 MED ORDER — FLUCONAZOLE IN SODIUM CHLORIDE 200-0.9 MG/100ML-% IV SOLN
200.0000 mg | INTRAVENOUS | Status: DC
  Administered 2017-08-28: 200 mg via INTRAVENOUS
  Filled 2017-08-28 (×2): qty 100

## 2017-08-28 NOTE — Progress Notes (Signed)
ANTICOAGULATION CONSULT NOTE Pharmacy Consult for argatroban Indication: r/o HIT  Allergies  Allergen Reactions  . Penicillins Anaphylaxis, Nausea And Vomiting and Rash    Has patient had a PCN reaction causing immediate rash, facial/tongue/throat swelling, SOB or lightheadedness with hypotension: Yes Has patient had a PCN reaction causing severe rash involving mucus membranes or skin necrosis: Yes Has patient had a PCN reaction that required hospitalization No Has patient had a PCN reaction occurring within the last 10 years: Yes If all of the above answers are "NO", then may proceed with Cephalosporin use.   . Lactose Intolerance (Gi) Other (See Comments)  . Pollen Extract Other (See Comments)    Seasonal Allergies  . Tape Rash     Labs: Recent Labs    08/26/17 0331 08/27/17 0324  08/28/17 0054 08/28/17 0400 08/28/17 0903  HGB 7.3* 7.1*  --  7.7*  --   --   HCT 23.2* 21.7*  --  24.0*  --   --   PLT 30* 33*  --  28*  --   --   APTT  --   --    < > 95* 103* 92*  CREATININE 1.08* 0.91  --  0.89  --   --    < > = values in this interval not displayed.    Estimated Creatinine Clearance: 68.6 mL/min (by C-G formula based on SCr of 0.89 mg/dL).   Assessment: 52 y/o female with advanced MS admitted on 08/18/17 with sepsis. Of note, she was thrombocytopenic on admit (platelets 65, were 173 on 07/20/17). Sepsis and linezolid initially thought to be cause but sepsis has resolved and linezolid was stopped 08/22/17. Heparin was stopped on 6/28 but no significant rise in platelets yet as they remain at 33.   Pharmacy consulted to begin argatroban to r/o HIT. No bleeding noted, Hgb has been low since admit (7-8s). Renal function has improved, SCr 0.91. D-dimer was elevated at 1.89 on 08/24/17.  PTT slightly elevated now at 92 seconds  Goal of Therapy:  aPTT 50-90 seconds Monitor platelets by anticoagulation protocol: Yes   Plan:  - Decrease argatroban to 0.31 mcg / kg / min - 2 hr  aPTT - Daily aPTT, CBC - Monitor for s/sx of bleeding - F/U HIT Ab   Thank you Okey Regal, PharmD [phone] Please check AMION for all Pharmacist numbers by unit 08/28/2017 10:40 AM

## 2017-08-28 NOTE — Progress Notes (Signed)
Daily Progress Note   Patient Name: Debra [name]       Date: 08/28/2017 DOB: [birth year]  Age: 52 y.o. MRN#: [id number] Attending Physician: [name], MD Primary Care Physician: [name], MD Admit Date: 08/18/2017  Reason for Consultation/Follow-up: Establishing goals of care and Non pain symptom management  Subjective: Debra [name] in bed, awake and alert, watching tv. Able to tell me who she is, where she is, and that she is watching "The Talk" on tv. Noted Dr. Donnelly Stager note and plans for workup of N/V. Discussed with patient and she is in agreement. She wants to eat and take her medications- she states she just feels nauseated, especially when she smells the food.  I discussed with Debra [name] the concern about her temperature.  Debra [name] tells me she did have pain in her legs last night. This was very severe and difficult to control on previous admissions. She had relief with administration of pain medication.    ROS  Length of Stay: 10  Current Medications: Scheduled Meds:  . baclofen  5 mg Oral TID  . chlorhexidine  15 mL Mouth Rinse BID  . collagenase   Topical Daily  . [START ON 08/29/2017] cosyntropin  0.25 mg Intravenous Once  . famotidine  20 mg Oral Daily  . feeding supplement (ENSURE ENLIVE)  237 mL Oral TID WC  . furosemide  40 mg Intravenous BID  . insulin aspart  0-9 Units Subcutaneous Q4H  . mouth rinse  15 mL Mouth Rinse q12n4p  . modafinil  100 mg Oral Daily  . nutrition supplement (JUVEN)  1 packet Oral BID BM  . nystatin  5 mL Oral QID  . ondansetron (ZOFRAN) IV  4 mg Intravenous Q8H  . sodium chloride flush  3 mL Intravenous Q12H  . sodium chloride flush  3 mL Intravenous Q12H  . Teriflunomide  14 mg Oral Daily    Continuous Infusions: . sodium  chloride Stopped (08/26/17 0656)  . sodium chloride Stopped (08/26/17 1751)  . sodium chloride    . argatroban 0.3067 mcg/kg/min (08/28/17 1042)  . dextrose 5 % and 0.45% NaCl Stopped (08/28/17 1008)  . fluconazole (DIFLUCAN) IV 100 mL/hr at 08/28/17 1042    PRN Meds: sodium chloride, acetaminophen **OR** acetaminophen, hydrALAZINE, HYDROmorphone (DILAUDID) injection, ondansetron **OR** ondansetron (ZOFRAN) IV, oxyCODONE, silver nitrate applicators,  sodium chloride flush, sodium chloride flush  Physical Exam  Constitutional: She is oriented to person, place, and time.  cachetic  Musculoskeletal:  Paraplegia with complete loss of use of lower limbs, upper limbs significantly impaired, she can barely press call button  Neurological: She is alert and oriented to person, place, and time.  Skin: Skin is warm and dry.  Psychiatric:  Flat affect, frowns at times, responds appropriately  Nursing note and vitals reviewed.           Vital Signs: BP 127/87 (BP Location: Left Arm)   Pulse 77   Temp (!) 97.4 F (36.3 C) (Oral)   Resp 16   Ht 5\' 3"  (1.6 m)   Wt 65.2 kg (143 lb 11.8 oz)   SpO2 100%   BMI 25.46 kg/m  SpO2: SpO2: 100 % O2 Device: O2 Device: Room Air O2 Flow Rate: O2 Flow Rate (L/min): 3 L/min  Intake/output summary:   Intake/Output Summary (Last 24 hours) at 08/28/2017 1441 Last data filed at 08/28/2017 1042 Gross per 24 hour  Intake 3549.37 ml  Output 1000 ml  Net 2549.37 ml   LBM: Last BM Date: 08/26/17 Baseline Weight: Weight: 54.4 kg (120 lb) Most recent weight: Weight: 65.2 kg (143 lb 11.8 oz)       Palliative Assessment/Data: PPS: 20%      Patient Active Problem List   Diagnosis Date Noted  . Moderate episode of recurrent major depressive disorder (HCC)   . Healthcare-associated pneumonia   . Lobar pneumonia, unspecified organism (HCC) 08/21/2017  . Sacral osteomyelitis (HCC)   . Sacral decubitus ulcer, stage IV (HCC)   . Malnutrition of moderate  degree 07/17/2017  . Sepsis (HCC) 07/16/2017  . Adjustment disorder with mixed anxiety and depressed mood   . MS (multiple sclerosis) (HCC)   . Palliative care by specialist   . Advance care planning   . Major depressive disorder, recurrent episode, moderate (HCC) 07/02/2017  . Staphylococcus epidermidis sepsis (HCC)   . PICC line infection, subsequent encounter   . Severe sepsis (HCC) 06/26/2017  . Acute metabolic encephalopathy   . Acute respiratory failure (HCC)   . Acute pyelonephritis   . Normocytic anemia 05/25/2017  . Decubitus ulcer of coccygeal region, stage IV (HCC) 05/21/2017  . 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 Proteus infection 05/06/2017  . Foley catheter in place on admission 05/06/2017  . Hyperglycemia 05/05/2017  . Bacteriuria 05/05/2017  . Recurrent UTI   . Palliative care encounter   . Goals of care, counseling/discussion   . Pressure injury of skin 04/14/2017  . Aortic atherosclerosis (HCC) 03/14/2017  . Major depressive disorder, single episode, severe without psychosis (HCC) 01/02/2017  . Dysthymia 12/27/2016  . HTN (hypertension)   . Neurogenic bladder   . Labile blood pressure   . Leukocytosis   . Labile blood glucose   . Sleep disturbance   . Multiple sclerosis (HCC) 11/22/2016  . Thoracic root lesion 11/22/2016  . Neuropathic pain   . Leg weakness, bilateral 11/20/2016  . History of CVA (cerebrovascular accident)   . Uncontrolled type 1 diabetes mellitus with diabetic peripheral neuropathy (HCC)   . Diabetic acidosis (HCC) 11/15/2016  . Back pain 10/31/2016  . AKI (acute kidney injury) (HCC)   . Stable angina pectoris (HCC)   . Vitamin D deficiency 08/31/2014  . 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 Hyperlipidemia 10/06/2013  . Peripheral neuropathy  08/19/2010  . Type 1 diabetes mellitus (HCC) 07/06/2010  . Hypertension 07/06/2010  .  Asthma 07/06/2010    Palliative Care Assessment & Plan   Patient Profile: 52 y.o. female  with past medical history of MS (resulting in neurogenic bladder, loss of use of BLE, weak BUE, painful lower extremities, dysphagia), depression, osteomyelitis of the coccyx (s/p prior surigical debridements- per surg consult may need more), aortic atherosclerosis, DM w/ hx of DKA, recurrent UTI's w/ sepsis,  admitted on 08/18/2017 with sepsis- chest xray indicating LLL pneumonia. Palliative medicine consulted for GOC.   Assessment/Recommendations/Plan   Continue current level of care  Patient's HCPOA is her aunt- Debra [name]  Agree with workup for N/V  Patient was on Cymbalta  30mg  daily previously- would recommend increasing to 30mg  bid for depression and pain control- as it seems her depression was ongoing prior to admission at her previous dose of 30mg  daily  Would also recommend restarting patient's Lyrica 25mg  TID for leg pain  Debra [name] continues to desire full scope care except for DNR if she is found in a pulseless state without respirations- this includes feeding tube.   PMT will continue to follow    Goals of Care and Additional Recommendations:  Limitations on Scope of Treatment: Full Scope Treatment  Code Status:  DNR  Prognosis:   < 3 months due to severe malnutrition  Discharge Planning:  To Be Determined  Care plan was discussed with patient.  Thank you for allowing the Palliative Medicine Team to assist in the care of this patient.   Time In: 1425 Time Out: 1500 Total Time 35 mins Prolonged Time Billed no      Greater than 50%  of this time was spent counseling and coordinating care related to the above assessment and plan.  [name], AGNP-C Palliative Medicine   Please contact Palliative Medicine Team phone at [phone] for questions and concerns.

## 2017-08-28 NOTE — Progress Notes (Signed)
Subjective: Patient seen and evaluated at bedside. The night team was called that patient was repeatedly hypoglycemic and hypothermic. She told us this morning she does get shaky with these hypoglycemic episodes but was feeling better when we saw her. She was able to swallow pills this morning without difficulty, but still doesn't have much of an appetite due to nausea. Denies any headaches, pain in lower extremities, dyspnea or chest pain.   Objective:  Vital signs in last 24 hours: Vitals:   08/27/17 1351 08/27/17 2129 08/28/17 0538 08/28/17 0755  BP: (!) 138/93 (!) 143/83 127/87   Pulse: 72 73 77   Resp: _0 Temp: 98.4 F (36.9 C) (!) 96.1 F (35.6 C) (!) 97.4 F (36.3 C) (!) 97.4 F (36.3 C)  TempSrc:  Axillary Oral Oral  SpO2: 100% 100% 100%   Weight:      Height:       General: awake, alert, chronically ill appearing female lying in bed in no acute distress.  Cardiovascular: normal rate and regular rhythm. Holosystolic murmur heard at left and right sternal borders. No gallop or friction rub.  Pulmonary/chest: normal chest excursion. Lungs clear to auscultation in anterior fields. Abdominal: soft, non-tender. Bowel sounds hypoactive.  MSK: bilateral upper and lower extremity pitting edema.  Psych: flat affect      Assessment/Plan:  Principal Problem:   Lobar pneumonia, unspecified organism ([place]) Active Problems:   Type 1 diabetes mellitus (HCC)   Hypertension   Multiple sclerosis (HCC)   Neurogenic bladder   Osteomyelitis of coccyx (HCC)   Sepsis (HCC)   Sacral decubitus ulcer, stage IV ([place])   Healthcare-associated pneumonia   Moderate episode of recurrent major depressive disorder ([place])  1. Depression #Goals of Care She was able to swallow first dose of Provigil this morning, so hopefully will see continued improvement in mental status.Pt and Aunt (HCPOA) had meeting with Palliative Care and Pt expressed desire for full scope of care (she doesrefuses  resuscitative effortsif she is found without a pulse or not breathing).Psychiatry consulted 6/29 for further recommendations regarding patient's history of depression, but unable to evaluate patient as she refused to talk or cooperate.Consider re-consult since she appears to be more cooperative with answering questions.  - Appreciate RN and her continued encouragement -HCPOA: Jason Coop [phone]  2. Pancytopenia: Blood counts have remained stable. Patient initially presented with thrombocytopenia which is new since admission. This was initially thought to be secondary to sepsis and worsened by linezolid, which she has been off of x 5 days with no improvement in platelet count.  Blood smear ordered 6/28, awaiting review from pathologist. DIC panel negative. - Daily CBC to monitor platelet count - Follow up blood smear - Continue to holdSQ heparin, SCDsin place - Follow-up on labs to check for HIT; continue agatroban given intermediate 4H score and the fact she is sedentary due to MS.   3. Malnutrition Anasarca Likely due to severe hypoalbuminemia in the setting of malnutrition due to odynophagia and dysphagia. She also complains of nausea which is a chronic issue for her. She does have thrush on exam and concern for esophageal candidiasis. We have switched diflucan to IV, scheduled zofran before meals and will keep head of bed elevated to help with PO intake. Will continue to monitor electrolytes as she is at high risk for refeeding syndrome. We will continue IV Lasix to help with anasarca.   4. Isolated elevated alk phos and GTT - upon further chart review,  this started in April, but has steadily progressed. Her abdominal exam is benign. An abdominal u/s in May was done which showed a 2cm hyperechoic lesion within the R liver with recommendations for MRI if she had a history of cancer. Although she has no cancer history, she is on Rituxan and Aubagio for MS which can increase  risk of malignancy. MRI has been ordered for further evaluation.   5. T1DM:Patient has been hypoglycemic the last several blood glucose checks. This has been present on prior admissions and likely due to poor PO intake. She was getting 5 units Lantus during this admission but has not received any since 6/30. She became hypoglycemic despite being on D5 at 50 cc/hr. We have adjusted insulin to 4 units along with SSI and will increase blood glucose checks, as she frequently goes into DKA.   6. Hypothermia - concerning for infection since she is also hypoglycemic. Repeat blood cultures have been ordered. She is also being evaluated for adrenal insufficiency.   7. HCAP Chronic Sacral Osteomyelitis and Ulceration Completed 7-days of Meropenem. Afebrile and without leukocytosis. She is saturating well on RA. - Hydrotherapy when able (thrombocytopenia) - Will need repeat CXR in 6-weeks  6. Advanced Multiple Sclerosis Neurogenic Bladder with ChronicIndwellingFoleyCatheter Patient bed-bound at baseline prior to admission. Was on Teriflunomide 50m daily prior to admission. This was held 6/22 when she was transferred to ICU for pressor support.  -Continue homeTeriflunomide - Continue Baclofen 547mTID - Continue Oxycodone and Dilaudid prn for chronic pain     Dispo: Anticipated discharge in approximately 2-3 day(s).   BlModena Nunnery, DO 08/28/2017, 2:52 PM Pager: [phone]

## 2017-08-28 NOTE — Progress Notes (Signed)
  Date: 08/28/2017  Patient name: Debra [name]  Medical record number: [id number]  Date of birth: 11/19/1965   I have seen and evaluated this patient and I have discussed the plan of care with the house staff. Please see their note for complete details. I concur with their findings with the following additions/corrections: Ms Bedore was seen on AM rounds with team.  1. Isolated elevated alk phos & GGT - This started in April and has progressed. Both are about 3 times ULN. She has nausea. Her exam in normal. She had an ABD U/S in May which showed a 2 cm hyperechoic lesion within the R liver. MRI was rec if she had a h/o CA. She has no h/o CA but has been on Rituxan and now on Aubagio. Aubagio may cause inc ALT but not Alk phos but both may increase risk of m,alignancy. Therefore, I would favor W/U with MRI if pt can tolerate the procedure as this would change prognosis and tx.   2. Hypoglycemia - this has been present during prior admission and attributed to poor PO intake. Her insulin was adjusted down to 4 units along with SSI. She had been getting 5 units Lantus this admission but last dose was 6/30. She bc hypoglycemic despite D5 at 50 cc/hour. She is Type I DM and has freq DKA. Currently gap is 6 and bicarb is 18. We will increase freq of CBG as she has no reason to have euglycemic DKA - hopefully this will alert [place] to early DKA. Cont D5 and encourage PO to maintain hydration and glucose.. Start Lantus, even one unit, ASAP.  3. Hypothermia - along with hypoglycemia, this is concerning, W/U infxn with repeat blood cxs and CXR. MS, hypoglycemia, and malnutrition may all cause hypothermia. Adrenal inusff being checked. I doubt myxedema coma as her TSH was 6 on 08/21/2017. This may simply be 2/2 hypoglycemia which is 2/2 insulin and poor PO intake.  4. Poor PO intake and nausea - this has been a chronic issue for her over multiple admissions. She is at risk for gastroparesis but before jumping to  an emptying eval, will sch zofran before meals and elevate HOB.   5. Thrombocytopenia - plts remain stable. Pt's plt have not responded as expected to stopping heparin products nor stopping lineziolid. 4H score was intermediate so agatroban started after assessment of risks (bleeding but no h/o GI bleeding, low plts but > 10) and benefits (VTE in sedentary woman without prophylaxis). Check labs to confirm.   Bartholomew Crews, MD 08/28/2017, 10:59 AM

## 2017-08-28 NOTE — Progress Notes (Signed)
Hypoglycemic Event  CBG: 52  Treatment: D50  Symptoms: none Follow-up CBG: Time:0445 CBG Result:157  Possible Reasons for Event: not eating  Comments/MD notified: none    Debra [name] N Marney Treloar

## 2017-08-28 NOTE — Progress Notes (Signed)
ANTICOAGULATION CONSULT NOTE Pharmacy Consult for argatroban Indication: r/o HIT  Allergies  Allergen Reactions  . Penicillins Anaphylaxis, Nausea And Vomiting and Rash    Has patient had a PCN reaction causing immediate rash, facial/tongue/throat swelling, SOB or lightheadedness with hypotension: Yes Has patient had a PCN reaction causing severe rash involving mucus membranes or skin necrosis: Yes Has patient had a PCN reaction that required hospitalization No Has patient had a PCN reaction occurring within the last 10 years: Yes If all of the above answers are "NO", then may proceed with Cephalosporin use.   . Lactose Intolerance (Gi) Other (See Comments)  . Pollen Extract Other (See Comments)    Seasonal Allergies  . Tape Rash     Labs: Recent Labs    08/26/17 0331 08/27/17 0324  08/28/17 0054 08/28/17 0400 08/28/17 0903 08/28/17 1539  HGB 7.3* 7.1*  --  7.7*  --   --   --   HCT 23.2* 21.7*  --  24.0*  --   --   --   PLT 30* 33*  --  28*  --   --   --   APTT  --   --    < > 95* 103* 92* 124*  CREATININE 1.08* 0.91  --  0.89  --   --   --    < > = values in this interval not displayed.    Estimated Creatinine Clearance: 68.6 mL/min (by C-G formula based on SCr of 0.89 mg/dL).   Assessment: 52 y/o female with advanced MS admitted on 08/18/17 with sepsis. Of note, she was thrombocytopenic on admit (platelets 65, were 173 on 07/20/17). Sepsis and linezolid initially thought to be cause but sepsis has resolved and linezolid was stopped 08/22/17. Heparin was stopped on 6/28 but no significant rise in platelets yet as they remain at 33.   Pharmacy consulted to begin argatroban to r/o HIT. No bleeding noted, Hgb has been low since admit (7-8s). Renal function has improved, SCr 0.89. D-dimer was elevated at 1.89 on 08/24/17.  PTT = 124 on Argatroban infusion at 0.31 mcg/kg/min.   RN reports moderate bleeding around wound site, not new.   Goal of Therapy:  aPTT 50-90  seconds Monitor platelets by anticoagulation protocol: Yes   Plan:  - Decrease argatroban to 0.21 mcg / kg / min - 2 hr aPTT - Daily aPTT, CBC - Monitor for s/sx of bleeding - F/U HIT Ab   Thank you Noah Delaine, RPh Clinical Pharmacist [phone] Please check AMION for all Pharmacist numbers by unit 08/28/2017 6:41 PM

## 2017-08-28 NOTE — Progress Notes (Signed)
Spoke with bedside nurse regarding PICC exchange order with blood culture to be drawn from line.  PICC cannot be exchanged if site/line is questionable for infection.  If this is in error, exchange could be performed.  Patient will require 2 nurses to place due to contractures, therefore PICC may be unable to be place tonight.  Bedside RN to follow up with MD.  Gasper Lloyd, RN VAST

## 2017-08-28 NOTE — Progress Notes (Signed)
  Speech Language Pathology Treatment: Dysphagia  Patient Details Name: Debra [name] MRN: [id number] DOB: 1965-11-19 Today's Date: 08/28/2017 Time: 7619-5093 SLP Time Calculation (min) (ACUTE ONLY): 19 min  Assessment / Plan / Recommendation Clinical Impression  Pt reports less nausea today, therefore SLP was able to provide skilled observation during med administration (crushed in puree) and thin liquid intake (~3 oz). No overt s/s of aspiration were noted. She had oral holding of purees, although question if some of this was also related to taste. SLP provided Min cues for use of liquid wash, which facilitate clearance. Pt unfortunately did not want any additional POs, even for potential diet progression. Would continue Dys 1 diet and thin liquids for now. SLP will f/u briefly as medical team tries to manage her N/V to see if there is potential to advance her solids any further.   HPI HPI: 52 yo female with MS, neurogenic bladder w/ chronic foley , chronic ulcers /osteomyelitis admitted with Sepsis ?PNA 6/22. CXR 08/18/17 showed new left lower lobe consolidation, concerning for pneumonia. Seen by ST during prior admissions in Feb 2019 and most recently May 2019, followed briefly due to reported difficulty with solids, pocketing foods. Advanced to regular/thin prior to d/c.       SLP Plan  Continue with current plan of care       Recommendations  Diet recommendations: Dysphagia 1 (puree);Thin liquid Liquids provided via: Cup;Straw Medication Administration: Crushed with puree Supervision: Staff to assist with self feeding;Full supervision/cueing for compensatory strategies Compensations: Slow rate;Small sips/bites Postural Changes and/or Swallow Maneuvers: Seated upright 90 degrees                Oral Care Recommendations: Oral care BID Follow up Recommendations: Skilled Nursing facility SLP Visit Diagnosis: Dysphagia, oral phase (R13.11) Plan: Continue with current plan of  care       GO                Maxcine Ham 08/28/2017, 4:35 PM  Maxcine Ham, M.A. CCC-SLP [phone]

## 2017-08-28 NOTE — Progress Notes (Signed)
Nutrition Follow-up  DOCUMENTATION CODES:   Not applicable  INTERVENTION:  Consider Reglan to help with nausea, vomiting, and history of gastroparesis  Switch to  Premier Protein BID, each supplement provides 160 calories and 30 grams of protein  Continue -1 packet Juven BID, each packet provides 80 calories, 8 grams of carbohydrate, and 14 grams of amino acids; supplement contains CaHMB, glutamine, and arginine, to promote wound healing  Follow GOC  Consider G-tube/GJ tube if expected to continue not to meet nutrition needs  NUTRITION DIAGNOSIS:   Increased nutrient needs related to wound healing as evidenced by estimated needs. -ongoing  GOAL:   Patient will meet greater than or equal to 90% of their needs -unmet  MONITOR:   PO intake, Supplement acceptance, Labs, I & O's, Skin, Weight trends, Other (Comment)(GOC)  ASSESSMENT:   52 year old female who arrived to the ED from [facility] with AMS, hypoglycemia, hypothermia, and hypotension. PMH significant for MS, neurogenic bladder with chronic indwelling catheter and bowel incontinence, chronic sacral ulcers and osteomyelitis, multiple UTIs, and diabetes mellitus type 1.  Spoke with patient at bedside. She was able to provide some history. Complains of nausea with all PO intake over the past 3-4 admissions and "no" po intake at home. She did not elaborate any further than this.   Patient has been intubated multiple times in her previous admissions. Ongoing GOC discussions, partial code at this time. Per RN, she has been refusing medications in addition to PO intake.  Suspect if her nausea can be controlled she will eat and take meds; this seemed to be the case in previous admissions. Otherwise she may need a G or GJ tube to meet nutrition needs.  Unable to diagnose malnutrition at this time  1L UOP this shift, 14.8L fluid positive  Labs reviewed:  CBGs 52-110  Medications reviewed and include:  Zofran Argatroban  gtt  Diet Order:   Diet Order           DIET - DYS 1 Room service appropriate? Yes with Assist; Fluid consistency: Thin  Diet effective now          EDUCATION NEEDS:   Not appropriate for education at this time  Skin:  Skin Assessment: Skin Integrity Issues: Skin Integrity Issues:: Stage III, Stage IV, Unstageable, DTI DTI: L heel Stage III: R heel Stage IV: sacrum Unstageable: R leg, L ischial tuberosity  Last BM:  08/21/17  Height:   Ht Readings from Last 1 Encounters:  08/20/17 5\' 3"  (1.6 m)    Weight:   Wt Readings from Last 1 Encounters:  08/27/17 143 lb 11.8 oz (65.2 kg)    Ideal Body Weight:  52.3 kg  BMI:  Body mass index is 25.46 kg/m.  Estimated Nutritional Needs:   Kcal:  1700-1900 kcal/day  Protein:  80-95 grams/day  Fluid:  1.7-1.9 L/day    Dionne Ano. Clella Mckeel, MS, RD LDN Inpatient Clinical Dietitian Pager [phone]

## 2017-08-29 LAB — CBC
HCT: 20.4 % — ABNORMAL LOW (ref 36.0–46.0)
HEMATOCRIT: 17.6 % — AB (ref 36.0–46.0)
HEMATOCRIT: 23.1 % — AB (ref 36.0–46.0)
HEMOGLOBIN: 5.7 g/dL — AB (ref 12.0–15.0)
Hemoglobin: 6.5 g/dL — CL (ref 12.0–15.0)
Hemoglobin: 7.6 g/dL — ABNORMAL LOW (ref 12.0–15.0)
MCH: 27.4 pg (ref 26.0–34.0)
MCH: 27.8 pg (ref 26.0–34.0)
MCH: 28.5 pg (ref 26.0–34.0)
MCHC: 31.9 g/dL (ref 30.0–36.0)
MCHC: 32.4 g/dL (ref 30.0–36.0)
MCHC: 32.9 g/dL (ref 30.0–36.0)
MCV: 86.1 fL (ref 78.0–100.0)
MCV: 85.9 fL (ref 78.0–100.0)
MCV: 86.5 fL (ref 78.0–100.0)
PLATELETS: 28 10*3/uL — AB (ref 150–400)
Platelets: 37 10*3/uL — ABNORMAL LOW (ref 150–400)
Platelets: 47 10*3/uL — ABNORMAL LOW (ref 150–400)
RBC: 2.37 MIL/uL — AB (ref 3.87–5.11)
RBC: 2.05 MIL/uL — AB (ref 3.87–5.11)
RBC: 2.67 MIL/uL — ABNORMAL LOW (ref 3.87–5.11)
RDW: 16.1 % — AB (ref 11.5–15.5)
RDW: 16.4 % — ABNORMAL HIGH (ref 11.5–15.5)
RDW: 15.6 % — AB (ref 11.5–15.5)
WBC: 2.6 10*3/uL — AB (ref 4.0–10.5)
WBC: 1.7 10*3/uL — ABNORMAL LOW (ref 4.0–10.5)
WBC: 2.3 10*3/uL — ABNORMAL LOW (ref 4.0–10.5)

## 2017-08-29 LAB — ACTH STIMULATION, 3 TIME POINTS
Cortisol, 30 Min: 26.5 ug/dL
Cortisol, 60 Min: 28.5 ug/dL
Cortisol, Base: 17.8 ug/dL

## 2017-08-29 LAB — COMPREHENSIVE METABOLIC PANEL
AST: 15 U/L (ref 15–41)
Albumin: 1.1 g/dL — ABNORMAL LOW (ref 3.5–5.0)
Alkaline Phosphatase: 462 U/L — ABNORMAL HIGH (ref 38–126)
Anion gap: 4 — ABNORMAL LOW (ref 5–15)
BUN: 9 mg/dL (ref 6–20)
CHLORIDE: 115 mmol/L — AB (ref 98–111)
CO2: 22 mmol/L (ref 22–32)
CREATININE: 0.86 mg/dL (ref 0.44–1.00)
Calcium: 7.3 mg/dL — ABNORMAL LOW (ref 8.9–10.3)
GFR calc Af Amer: >60 mL/min (ref >60)
GLUCOSE: 135 mg/dL — AB (ref 70–99)
POTASSIUM: 3 mmol/L — AB (ref 3.5–5.1)
SODIUM: 141 mmol/L (ref 135–145)
Total Bilirubin: 0.7 mg/dL (ref 0.3–1.2)
Total Protein: 4 g/dL — ABNORMAL LOW (ref 6.5–8.1)

## 2017-08-29 LAB — GLUCOSE, CAPILLARY
Glucose-Capillary: 127 mg/dL — ABNORMAL HIGH (ref 70–99)
Glucose-Capillary: 188 mg/dL — ABNORMAL HIGH (ref 70–99)
Glucose-Capillary: 210 mg/dL — ABNORMAL HIGH (ref 70–99)
Glucose-Capillary: 146 mg/dL — ABNORMAL HIGH (ref 70–99)
Glucose-Capillary: 145 mg/dL — ABNORMAL HIGH (ref 70–99)

## 2017-08-29 LAB — MAGNESIUM: Magnesium: 1.8 mg/dL (ref 1.7–2.4)

## 2017-08-29 LAB — APTT: APTT: 72 s — AB (ref 24–36)

## 2017-08-29 LAB — PREPARE RBC (CROSSMATCH)

## 2017-08-29 LAB — HEPARIN INDUCED PLATELET AB (HIT ANTIBODY): Heparin Induced Plt Ab: 1.449 OD — ABNORMAL HIGH (ref 0.000–0.400)

## 2017-08-29 MED ORDER — HALOPERIDOL LACTATE 5 MG/ML IJ SOLN
1.0000 mg | Freq: Four times a day (QID) | INTRAMUSCULAR | Status: DC
Start: 2017-08-29 — End: 2017-09-07
  Administered 2017-08-29 – 2017-09-07 (×30): 1 mg via INTRAVENOUS
  Filled 2017-08-29 (×31): qty 1

## 2017-08-29 MED ORDER — POTASSIUM CHLORIDE 10 MEQ/100ML IV SOLN
10.0000 meq | INTRAVENOUS | Status: AC
  Administered 2017-08-29 (×2): 10 meq via INTRAVENOUS
  Filled 2017-08-29 (×2): qty 100

## 2017-08-29 MED ORDER — PREGABALIN 25 MG PO CAPS
25.0000 mg | ORAL_CAPSULE | Freq: Three times a day (TID) | ORAL | Status: DC
  Administered 2017-08-29 – 2017-09-05 (×7): 25 mg via ORAL
  Filled 2017-08-29 (×15): qty 1

## 2017-08-29 MED ORDER — DULOXETINE HCL 30 MG PO CPEP
30.0000 mg | ORAL_CAPSULE | Freq: Every day | ORAL | Status: DC
  Administered 2017-08-29 – 2017-09-05 (×4): 30 mg via ORAL
  Filled 2017-08-29 (×6): qty 1

## 2017-08-29 MED ORDER — FLUCONAZOLE 100 MG PO TABS
200.0000 mg | ORAL_TABLET | Freq: Every day | ORAL | Status: DC
  Administered 2017-08-29 – 2017-08-30 (×2): 200 mg via ORAL
  Filled 2017-08-29 (×4): qty 2

## 2017-08-29 MED ORDER — POTASSIUM CHLORIDE 10 MEQ/100ML IV SOLN
10.0000 meq | Freq: Once | INTRAVENOUS | Status: AC
  Administered 2017-08-29: 10 meq via INTRAVENOUS
  Filled 2017-08-29: qty 100

## 2017-08-29 MED ORDER — POTASSIUM CHLORIDE 20 MEQ/15ML (10%) PO SOLN
40.0000 meq | Freq: Once | ORAL | Status: AC
  Administered 2017-08-29: 40 meq via ORAL
  Filled 2017-08-29: qty 30

## 2017-08-29 MED ORDER — DEXTROSE-NACL 5-0.9 % IV SOLN
INTRAVENOUS | Status: DC
  Administered 2017-08-29 – 2017-08-30 (×3): via INTRAVENOUS

## 2017-08-29 MED ORDER — DEXTROSE-NACL 5-0.45 % IV SOLN
INTRAVENOUS | Status: DC
  Administered 2017-08-29: 05:00:00 via INTRAVENOUS

## 2017-08-29 MED ORDER — SODIUM CHLORIDE 0.9% IV SOLUTION
Freq: Once | INTRAVENOUS | Status: AC
  Administered 2017-08-30: 09:00:00 via INTRAVENOUS

## 2017-08-29 NOTE — Progress Notes (Signed)
Unable to obtain temp for patient by axillary or oral route with several different thermometers--rectal temp not attempted due to platelets of 28. Bair hugger placed on pt at max temp. Unable to transfuse 1uPRBCs without temp reading, by policy. MD Gwyneth Revels notified. MD states issue will be discussed with day team that is arriving now and day team will manage patient from this point forward.

## 2017-08-29 NOTE — Progress Notes (Signed)
   08/29/17 0420 08/29/17 0421  Vitals  BP (!) 87/69 119/77  MAP (mmHg) 77 90  BP Location Left Arm Left Arm  BP Method Automatic Automatic  Patient Position (if appropriate) Lying Lying  Pulse Rate 65 63  Pulse Rate Source  --  Monitor  Resp  --  10  Oxygen Therapy  SpO2 100 % 100 %  O2 Device Room Air Room Air   MD Krienke notified of critical HGB of 6.5 and above vitals. Pt continues to have blood tinged urine from foley that began yesterday. Otherwise, no obvious signs of gross bleeding. Argatroban gtt stopped and plan to transfuse with 1uPRBCs. Will continue to monitor.

## 2017-08-29 NOTE — Progress Notes (Signed)
MD notified of patient needing to be progressive care or in the ICU. Unable to get a temperature to read on her. Patient is on a bear hugger which was placed last night. Patient is unstable and MD said they are coming to round on her first.

## 2017-08-29 NOTE — Progress Notes (Signed)
  Date: 08/29/2017  Patient name: Debra [name]  Medical record number: [id number]  Date of birth: 23-Mar-1965   I have seen and evaluated this patient and I have discussed the plan of care with the house staff. Please see their note for complete details. I concur with their findings with the following additions/corrections: [name]  was seen on morning rounds.   We are also able to speak with the time of care member to help solidify our plans.  Palliative care has estimated she has less than 3 months life expectancy due to her severe malnutrition which has not improved during this hospital stay.  The patient and her power of attorney has, over multiple conversations, wanted full scope of care except if she were to die in which she would want DNR.  We are modifying our plans due to the severely limited life expectancy but will continue to offer treatments which will reverse that easily reversible condition or provide comfort and lines of the patient's goals of care.  1.  Isolated elevated alk phos and GGT -we will not pursue MRI due to limited life expectancy.  The MRI will not provide any meaningful information which would change our current management.  2.  Hypoglycemia -she has not had hypoglycemia for over 24 hours.  She remains on D5 infusion at a higher rate of 125 mils per hour.  We can decrease her CBG frequency.  Her Lantus has not been restarted but she is on NovoLog sliding scale insulin and has gotten 5 units so far today.  3.  Poor p.o. intake and nausea - she has scheduled Zofran, we are elevating the head of bed, and palliative care has added on Haldol.  4.  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 Agatroban D/C'd due to bleeding and anemia. Risks now outweigh benefits in light of life expectancy and blood loss.  5. Anemia - HgB trended down from about 7 to 6.5 - 5.7. Receiving PRBC. Agatroban D/C'd. Plts stable but low.   Our biggest Dilemma is whether to offer a feeding tube. GOC indicate  she wants a feeding tube. Feeding tubes are not benign, will not correct the underlying issues, and are assoc with complications. Will discuss with palliative care.  Bartholomew Crews, MD 08/29/2017, 2:55 PM

## 2017-08-29 NOTE — Progress Notes (Signed)
CSW to continue to follow patient for needs.  Osborne Casco Fernando Torry LCSW [phone]

## 2017-08-29 NOTE — Progress Notes (Deleted)
   08/29/17 0420 08/29/17 0421  Vitals  BP (!) 87/69 119/77  MAP (mmHg) 77 90  BP Location Left Arm Left Arm  BP Method Automatic Automatic  Patient Position (if appropriate) Lying Lying  Pulse Rate 65 63  Pulse Rate Source  --  Monitor  Resp  --  10  Oxygen Therapy  SpO2 100 % 100 %  O2 Device Room Air Room Air   MD Krienke notified of above vitals and critical HGB of 6.5. Pt continues to have blood-tinged urine from foley that began yesterday. Otherwise, no obvious signs of bleeding. Argatroban gtt stopped. Plan to transfuse 1uPRBCs. Will continue to monitor.

## 2017-08-29 NOTE — Consult Note (Signed)
WOC Nurse wound consult note Reason for Consult:sacral wound stage 4 Wound type:pressure  Pressure Injury POA: Yes Measurement: see previous note Wound bed: Drainage (amount, consistency, odor)  Periwound: Dressing procedure/placement/frequency: Patient has been seen by our team and orders written for Santyl dressing changes. Patient has been referred to surgery who can not debride until patient is more stable. 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 Continue with Santyl for now and have Surgery reassess when patient out of critical PTT levels. Patient on protein supplement for her malnutrition. We will not follow, but will remain available to this patient, to nursing, and the medical and/or surgical teams.  Please re-consult if we need to assist further.  Barnett Hatter, RN-C, WTA-C, OCA Wound Treatment Associate Ostomy Care Associate

## 2017-08-29 NOTE — Progress Notes (Signed)
Subjective: Unfortunately, Ms. Gayle had a fairly eventful night. She was incontinent of stool so night team had flexi seal placed. Her hemoglobin dropped to 6.5 so a unit of RBCs was ordered for transfusion. When we saw her this morning, she complained of chest pain described as dull and achey that began yesterday. It does not radiate anywhere. No worsening or alleviating factors. She also complained of a headache, some abdominal discomfort and continued nausea. She does say the zofran helps some. Denies any fevers, chills, or shortness of breath.  Objective:  Vital signs in last 24 hours: Vitals:   08/29/17 1244 08/29/17 1254 08/29/17 1455 08/29/17 1510  BP:  139/87 (!) 143/77 127/74  Pulse:  72 73 73  Resp:  _0 Temp: 97.6 F (36.4 C)  97.6 F (36.4 C) 97.7 F (36.5 C)  TempSrc: Oral  Oral Oral  SpO2:  100% 100% 100%  Weight:      Height:       General: drowsy, but arousable female, chronically ill appearing in no acute distress. Cardiovascular: normal rate and regular rhythm. Holosystolic murmur heard at left and right sternal borders. No gallop or friction rub.  Pulmonary/chest: normal chest excursion. Lungs clear to auscultation in anterior fields. Abdominal: soft, mildly tender to palpation of epigastric area. Bowel sounds hypoactive.  MSK: bilateral upper and lower extremity pitting edema.  Psych: flat affect    Assessment/Plan:  Principal Problem:   Lobar pneumonia, unspecified organism ([place]) Active Problems:   Type 1 diabetes mellitus (HCC)   Hypertension   Multiple sclerosis (HCC)   Neurogenic bladder   Osteomyelitis of coccyx (HCC)   Sepsis ([place])   Sacral decubitus ulcer, stage IV ([place])   Healthcare-associated pneumonia   Moderate episode of recurrent major depressive disorder (HCC)   Severe episode of recurrent major depressive disorder, without psychotic features ([place])  1.Depression #Goals of Care Currently on Provigil. Remeron was also  restarted. Pt and Aunt (HCPOA) had meeting with Palliative Care and Pt expressed desire for full scope of care (she doesrefuses resuscitative effortsif she is found without a pulse or not breathing).Palliative care has estimated she has less than 3 months life expectancy due to severe malnutrition. We will therefore treat things that are easily reversible, as well as provide comfort that is in line with the patient's goals of care. Appreciate them following her case. They also recommend increasing Cymbalta and resuming Lyrica which have been ordered.  -HCPOAJason Coop [phone]  2. Isolated elevated alk phos and GTT - we will no longer pursue MRI, as it will not change patient's management.   2.Acute on chronic anemia. Patient's hgb trended down form 7 to 6.5 to 5.7 Will receive PRBC and follow-up on CBC. Agatroban has been discontinued. Plts are low but stable. There are no signs of active bleeding, other than blood tinged urine from chronic foley placement. Patient is hemodynamically stable at this time.   3.Malnutrition Anasarca Likely due to severe hypoalbuminemia in the setting of malnutrition due to odynophagia and dysphagia. She also complains of nausea which is a chronic issue for her. She does have thrush on exam and concern for esophageal candidiasis. We have switched diflucan to IV, scheduled zofran before meals and will keep head of bed elevated to help with PO intake.  - Holding IV Lasix for the time being given drop in hemoglobin.   5.T1DM:Patient has not had any hypoglycemic events for over 24 hrs. Currently on D5 at 125 ml/hr. Continue  SSI.    6. Hypothermia - improved; labs revealed no signs of adrenal insufficiency. Blood cultures show no growth at 24 hrs. Suspect this was likely secondary to hypoglycemia.    7. HCAP Chronic Sacral Osteomyelitis and Ulceration Completed 7-days of Meropenem. Afebrile and without leukocytosis. She is saturating well on  RA. - Hydrotherapy when able (thrombocytopenia) - Will need repeat CXR in 6-weeks  6.Advanced Multiple Sclerosis Neurogenic Bladder with ChronicIndwellingFoleyCatheter Patient bed-bound at baseline prior to admission. Was on Teriflunomide 14mg daily prior to admission. This was held 6/22 when she was transferred to ICU for pressor support.  -Continue homeTeriflunomide - Continue Baclofen 5mg TID - Continue Oxycodone and Dilaudid prn for chronic pain    Dispo: Anticipated discharge in approximately 2-3 day(s).   ,  D, DO 08/29/2017, 3:24 PM Pager: [phone]  

## 2017-08-29 NOTE — Progress Notes (Signed)
MD notified of the IV team needing an order to place a completely new picc line and not being able to exchange with the current one since labs have been drawn with the current one. The MD said they need cultures on the patient so they need to get rid of the IV team picc line order for now. IV team is not needed at this time.

## 2017-08-29 NOTE — Progress Notes (Signed)
Paged for hemoglobin of 6.5. Vitals showed hypotension with a BP of 87/69, MAP 77. Nurse reports no signs or symptoms of active bleeding. Unfortunately access has been an ongoing issue tonight. 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 Although there are no signs of active bleeding, given the acute drop in hemoglobin and hypotension we will hold argatroban and transfuse 1 unit of pRBC. Increase fluids D5 1/2 NS to 150 cc/hr x 10 hours. Will recheck CBC following transfusion.   Claudean Severance, M.D. PGY1 Pager [phone] 08/29/2017 4:29 AM

## 2017-08-29 NOTE — Progress Notes (Signed)
ANTICOAGULATION CONSULT NOTE Pharmacy Consult for Argatroban Indication: r/o HIT  Allergies  Allergen Reactions  . Penicillins Anaphylaxis, Nausea And Vomiting and Rash    Has patient had a PCN reaction causing immediate rash, facial/tongue/throat swelling, SOB or lightheadedness with hypotension: Yes Has patient had a PCN reaction causing severe rash involving mucus membranes or skin necrosis: Yes Has patient had a PCN reaction that required hospitalization No Has patient had a PCN reaction occurring within the last 10 years: Yes If all of the above answers are "NO", then may proceed with Cephalosporin use.   . Lactose Intolerance (Gi) Other (See Comments)  . Pollen Extract Other (See Comments)    Seasonal Allergies  . Tape Rash     Labs: Recent Labs    08/27/17 0324  08/28/17 0054  08/28/17 1539 08/28/17 2045 08/29/17 0327  HGB 7.1*  --  7.7*  --   --   --  6.5*  HCT 21.7*  --  24.0*  --   --   --  20.4*  PLT 33*  --  28*  --   --   --  28*  APTT  --    < > 95*   < > 124* 59* 72*  CREATININE 0.91  --  0.89  --   --   --   --    < > = values in this interval not displayed.    Estimated Creatinine Clearance: 69.4 mL/min (by C-G formula based on SCr of 0.89 mg/dL).   Assessment: 52 y/o female with advanced MS admitted on 08/18/17 with sepsis. Of note, she was thrombocytopenic on admit (platelets 65, were 173 on 07/20/17). Sepsis and linezolid initially thought to be cause but sepsis has resolved and linezolid was stopped 08/22/17. Heparin was stopped on 6/28 but no significant rise in platelets yet as they remain at 33.   Pharmacy consulted to begin argatroban to r/o HIT. No bleeding noted, Hgb has been low since admit (7-8s). Renal function has improved, SCr 0.89. D-dimer was elevated at 1.89 on 08/24/17.  7/3 AM Update: PTT = 72 on Argatroban infusion at 0.21 mcg/kg/min.   Hgb down some this AM (continue to watch closely) Plts unchanged this AM at 28   Goal of Therapy:   aPTT 50-90 seconds Monitor platelets by anticoagulation protocol: Yes   Plan:  - Cont argatroban at 0.21 mcg / kg / min - 2 hr aPTT to confirm - Daily aPTT, CBC - Trend Hgb - F/U HIT Ab   Abran Duke, PharmD, BCPS Clinical Pharmacist Phone: [phone]

## 2017-08-29 NOTE — Progress Notes (Signed)
Palliative Medicine RN Note: Symptom check/support visit. Patient was getting bathed when I came earlier today.  She is currently drowsy. I asked if I could sit down and visit, and she said she was too tired. She is c/o being cold and nauseated. I got her two more blankets.  RN reports it isn't time for her nausea medicine yet.   I discussed the patient with PMT PA Norvel Richards. She added Haldol to the regimen for nausea, with an order to hold it if she is sleeping. Consider changing it to prn tomorrow if she is too sleepy.  Plan for PMT team member to follow up tomorrow.  Debra Chance Ellyanna Holton, RN, BSN, [facility] Palliative Medicine Team 08/29/2017 2:04 PM Office [phone]

## 2017-08-30 LAB — BASIC METABOLIC PANEL
Anion gap: 6 (ref 5–15)
BUN: 5 mg/dL — AB (ref 6–20)
CO2: 20 mmol/L — AB (ref 22–32)
Calcium: 7.3 mg/dL — ABNORMAL LOW (ref 8.9–10.3)
Chloride: 119 mmol/L — ABNORMAL HIGH (ref 98–111)
Creatinine, Ser: 0.87 mg/dL (ref 0.44–1.00)
GFR calc Af Amer: >60 mL/min (ref >60)
GFR calc non Af Amer: >60 mL/min (ref >60)
Glucose, Bld: 134 mg/dL — ABNORMAL HIGH (ref 70–99)
POTASSIUM: 3.1 mmol/L — AB (ref 3.5–5.1)
Sodium: 145 mmol/L (ref 135–145)

## 2017-08-30 LAB — TYPE AND SCREEN
ABO/RH(D): O POS
ANTIBODY SCREEN: NEGATIVE
Unit division: 0

## 2017-08-30 LAB — BPAM RBC
Blood Product Expiration Date: 201907222359
ISSUE DATE / TIME: 201907031445
UNIT TYPE AND RH: 5100

## 2017-08-30 LAB — CBC
HCT: 23.3 % — ABNORMAL LOW (ref 36.0–46.0)
HEMOGLOBIN: 7.6 g/dL — AB (ref 12.0–15.0)
MCH: 28.5 pg (ref 26.0–34.0)
MCHC: 32.6 g/dL (ref 30.0–36.0)
MCV: 87.3 fL (ref 78.0–100.0)
PLATELETS: 52 10*3/uL — AB (ref 150–400)
RBC: 2.67 MIL/uL — AB (ref 3.87–5.11)
RDW: 16.2 % — ABNORMAL HIGH (ref 11.5–15.5)
WBC: 2.9 10*3/uL — ABNORMAL LOW (ref 4.0–10.5)

## 2017-08-30 LAB — MAGNESIUM: Magnesium: 1.7 mg/dL (ref 1.7–2.4)

## 2017-08-30 LAB — GLUCOSE, CAPILLARY
GLUCOSE-CAPILLARY: 105 mg/dL — AB (ref 70–99)
GLUCOSE-CAPILLARY: 128 mg/dL — AB (ref 70–99)
Glucose-Capillary: 116 mg/dL — ABNORMAL HIGH (ref 70–99)
Glucose-Capillary: 127 mg/dL — ABNORMAL HIGH (ref 70–99)
Glucose-Capillary: 171 mg/dL — ABNORMAL HIGH (ref 70–99)
Glucose-Capillary: 201 mg/dL — ABNORMAL HIGH (ref 70–99)

## 2017-08-30 LAB — PHOSPHORUS: Phosphorus: 2.3 mg/dL — ABNORMAL LOW (ref 2.5–4.6)

## 2017-08-30 MED ORDER — POTASSIUM CHLORIDE 10 MEQ/100ML IV SOLN
10.0000 meq | INTRAVENOUS | Status: AC
  Administered 2017-08-30 (×3): 10 meq via INTRAVENOUS
  Filled 2017-08-30 (×3): qty 100

## 2017-08-30 MED ORDER — SODIUM PHOSPHATES 45 MMOLE/15ML IV SOLN
10.0000 mmol | Freq: Once | INTRAVENOUS | Status: AC
  Administered 2017-08-30: 10 mmol via INTRAVENOUS
  Filled 2017-08-30: qty 3.33

## 2017-08-30 MED ORDER — PHENOL 1.4 % MT LIQD: 1.0000 | OROMUCOSAL | Status: DC | PRN

## 2017-08-30 MED ORDER — KCL IN DEXTROSE-NACL 40-5-0.45 MEQ/L-%-% IV SOLN
INTRAVENOUS | Status: DC
  Administered 2017-08-30 – 2017-09-01 (×5): via INTRAVENOUS
  Filled 2017-08-30 (×7): qty 1000

## 2017-08-30 NOTE — Progress Notes (Signed)
Subjective: Patient seen and evaluated at bedside. This morning, she appears tired but answers our questions. She has not had anything to eat in >24 hours due to decreased appetite, nausea and odynophagia. She has been taking her pills (per chart review). She denies any fevers, chills, chest pain, shortness of breath, or pain in her legs at this time.   Objective:  Vital signs in last 24 hours: Vitals:   08/30/17 0614 08/30/17 0803 08/30/17 0942 08/30/17 1306  BP: (!) 162/86  (!) 150/83 (!) 144/81  Pulse: 76  82 82  Resp: _0 Temp: 98.1 F (36.7 C) 98 F (36.7 C)  (!) 97.5 F (36.4 C)  TempSrc:  Oral  Oral  SpO2: 99%  100% 99%  Weight:      Height:       General: awake, alert, chronically ill appearing female lying in bed in no acute distress. HEENT: limited throat exam without tongue depressor and patient unable to open mouth very wide, but limited view of hard palate reveals no signs of thrush.  Cardiovascular: RRR. Holosystolic murmur heard at upper sternal borders. No gallops or friction rub.  Respiratory: normal chest excursion. Lungs clear to auscultation.  Abdominal: soft, mildly tender to palpation of epigastric area. Bowel sounds hypoactive. MSK: bilateral upper and lower extremity pitting edema Psych: depressed mood, flat affect.   Assessment/Plan:  Principal Problem:   Lobar pneumonia, unspecified organism ([place]) Active Problems:   Type 1 diabetes mellitus (HCC)   Hypertension   Multiple sclerosis (HCC)   Neurogenic bladder   Osteomyelitis of coccyx (HCC)   Sepsis ([place])   Sacral decubitus ulcer, stage IV (HCC)   Healthcare-associated pneumonia   Moderate episode of recurrent major depressive disorder (HCC)   Severe episode of recurrent major depressive disorder, without psychotic features ([place])  1.Depression #Goals of Care Currently on Provigil. Remeron was also restarted. Cymbalta was increased and Lyrica was resumed per palliative care  recommendations.  Pt and Aunt (HCPOA) had meeting with Palliative Care and Pt expressed desire for full scope of care (she doesrefuses resuscitative effortsif she is found without a pulse or not breathing).Palliative care has estimated she has less than 3 months life expectancy due to severe malnutrition. We will therefore treat things that are easily reversible, as well as provide comfort that is in line with the patient's goals of care. Appreciate their extensive involvement in her case. They also recommend increasing Cymbalta and resuming Lyrica which have been ordered.  -HCPOAJason Coop [phone]  2. Isolated elevated alk phos and GTT - we will no longer pursue MRI, as it will not change patient's management.   3.Acute on chronic anemia. Patient's hgb trended down form 7 to 6.5 to 5.7 Received 1 unit PRBCs. Hgb post transfusion was 7.6 last night and remained stable at 7.6 again this morning. Agatroban has been discontinued. There are no signs of active bleeding, other than blood tinged urine from chronic foley placement. Patient is hemodynamically stable at this time.   4. 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 Have stopped argatroban due to bleeding and acute on chronic anemia as mentioned above.   5.Malnutrition Anasarca Likely due to severe hypoalbuminemia in the setting of severe malnutrition. Patient has complained of odynophagia this admission. Is currently on diflucan for oral candidiasis seen on exam previously. This morning on limited exam there was no visible thrush. She continues to complain of nausea which is a chronic issue for  her. We will continue scheduled zofran, as she says it helps. Patient has indicated in [place] discussions that she would like a feeding tube placed if it were to become necessary. Palliative has indicated that it would not likely change her overall outcome, and I do not feel she is a good  candidate for one given her platelets of 52, significant edema and the fact she is immunocompromised. We will continue to have discussions with patient, POA, and palliative team with regards to this difficult situation.    6.T1DM:Patient has not had any hypoglycemic events for over 48 hrs, but long acting insulin has been held, as she requires D5 1/2 at 125 ml/hr to maintain normal blood glucose levels. Will continue with just short term acting insulin for now, but will continue to monitor closely since she is a type 1 diabetic with history of frequent DKA.     7. Hypothermia - improved; labs revealed no signs of adrenal insufficiency. Blood cultures show no growth at 48hrs. Suspect this was likely secondary to hypoglycemia.    8.HCAP Chronic Sacral Osteomyelitis and Ulceration Completed 7-days of Meropenem. Afebrile and without leukocytosis. She is saturating well on RA. - Hydrotherapy when able (thrombocytopenia) - Will need repeat CXR in 6-weeks  9.Advanced Multiple Sclerosis Neurogenic Bladder with ChronicIndwellingFoleyCatheter Patient bed-bound at baseline prior to admission. Was on Teriflunomide 37m daily prior to admission. This was held 6/22 when she was transferred to ICU for pressor support.  -Continue homeTeriflunomide - Continue Baclofen 530mTID - Continue Oxycodone and Dilaudid prn for chronic pain    Dispo: Anticipated discharge in approximately 3-4 day(s).   BlModena Nunnery, DO 08/30/2017, 1:23 PM Pager: [phone]

## 2017-08-30 NOTE — Progress Notes (Signed)
  Date: 08/30/2017  Patient name: Debra [name]  Medical record number: [id number]  Date of birth: 06-21-1965   I have seen and evaluated this patient and I have discussed the plan of care with the house staff. Please see their note for complete details. I concur with their findings with the following additions/corrections: [name] was seen on AM rounds.  1. Hypoglycemia - requiring D5 @ 125 cc/hr and holding all long acting insulin, despite being a type I DM, to avoid hypoglycemia. Cause is most certainly poor nutrition. Has had freq DKA - will be alert for this.  2. 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 Due to bleeding and anemia requiring transfusion, we cannot use argatroban so remains at high VTE & arterial clotting risk.    3. Anemia - now s/p PRBC and HgB increased 5.7 - 7.6 - 7.6. Argatroban stopped and HgB holding steady. She is not an endoscopy candidate.  4. Poor PO intake - She is on diflucan for oral candidate. Limited exam today as no tongue depressor and could only open mouth slightly but observed portion of hard palate without thrush. She has indicated in GOC conversations that she would want a feeding tube. I agree with palliative care that this will not change the overall outcome, might delay death a short while. I do not feel [name] is a candidate for a NG (plts 52, do not anticipate only a temp need) nor a percutaneous feeding tube (plts 52, sig subQ edema so poor wound healing, possible increase in N, infxn risk as she is immunocompromised). We have an obligation to adhere to her GOC decisions but also an obligation to not harm a pt and I feel offering a feeding team will result in more harm than good. Cont discussions with palliative, team, pt, and medical POA.   Burns Spain, MD 08/30/2017, 1:09 PM

## 2017-08-30 NOTE — Progress Notes (Addendum)
Daily Progress Note   Patient Name: Debra [name]       Date: 08/30/2017 DOB: 1965/12/24  Age: 52 y.o. MRN#: [id number] Attending Physician: [name], MD Primary Care Physician: [name], MD Admit Date: 08/18/2017  Reason for Consultation/Follow-up: Establishing goals of care  Subjective: [name] resting comfortably this morning. Events of yesterday noted. Very difficult situation. ROS  Length of Stay: 12  Current Medications: Scheduled Meds:  . baclofen  5 mg Oral TID  . chlorhexidine  15 mL Mouth Rinse BID  . collagenase   Topical Daily  . DULoxetine  30 mg Oral Daily  . fluconazole  200 mg Oral Daily  . haloperidol lactate  1 mg Intravenous Q6H  . insulin aspart  0-9 Units Subcutaneous Q4H  . mouth rinse  15 mL Mouth Rinse q12n4p  . mirtazapine  45 mg Oral QHS  . modafinil  100 mg Oral Daily  . nutrition supplement (JUVEN)  1 packet Oral BID BM  . nystatin  5 mL Oral QID  . ondansetron (ZOFRAN) IV  4 mg Intravenous Q8H  . pregabalin  25 mg Oral TID  . protein supplement shake  11 oz Oral BID BM  . sodium chloride flush  3 mL Intravenous Q12H  . sodium chloride flush  3 mL Intravenous Q12H  . Teriflunomide  14 mg Oral Daily    Continuous Infusions: . sodium chloride Stopped (08/26/17 0656)  . sodium chloride Stopped (08/29/17 1441)  . sodium chloride    . dextrose 5 % and 0.45 % NaCl with KCl 40 mEq/L 125 mL/hr at 08/30/17 0916    PRN Meds: sodium chloride, acetaminophen **OR** acetaminophen, hydrALAZINE, HYDROmorphone (DILAUDID) injection, ondansetron **OR** ondansetron (ZOFRAN) IV, oxyCODONE, phenol, silver nitrate applicators, sodium chloride flush, sodium chloride flush  Physical Exam          Vital Signs: BP (!) 150/83 (BP Location: Left  Arm)   Pulse 82   Temp 98 F (36.7 C) (Oral)   Resp 11   Ht 5\' 3"  (1.6 m)   Wt 66.7 kg (147 lb)   SpO2 100%   BMI 26.04 kg/m  SpO2: SpO2: 100 % O2 Device: O2 Device: Room Air O2 Flow Rate: O2 Flow Rate (L/min): 3 L/min  Intake/output summary:   Intake/Output Summary (Last 24 hours) at 08/30/2017 1144  Last data filed at 08/30/2017 0700 Gross per 24 hour  Intake 2801.32 ml  Output -  Net 2801.32 ml   LBM: Last BM Date: 08/29/17 Baseline Weight: Weight: 54.4 kg (120 lb) Most recent weight: Weight: 66.7 kg (147 lb)       Palliative Assessment/Data:    Flowsheet Rows     Most Recent Value  Intake Tab  Referral Department  Hospitalist  Unit at Time of Referral  Intermediate Care Unit  Palliative Care Primary Diagnosis  Other (Comment) [Multiple sclerosis (HCC)]  Date Notified  08/23/17  Palliative Care Type  Return patient Palliative Care  Reason for referral  Clarify Goals of Care  Date of Admission  08/18/17  Date first seen by Palliative Care  08/24/17  # of days Palliative referral response time  1 Day(s)  # of days IP prior to Palliative referral  5  Clinical Assessment  Psychosocial & Spiritual Assessment  Palliative Care Outcomes      Patient Active Problem List   Diagnosis Date Noted  . Severe episode of recurrent major depressive disorder, without psychotic features (HCC)   . Moderate episode of recurrent major depressive disorder (HCC)   . Healthcare-associated pneumonia   . Lobar pneumonia, unspecified organism (HCC) 08/21/2017  . Sacral osteomyelitis (HCC)   . Sacral decubitus ulcer, stage IV (HCC)   . Malnutrition of moderate degree 07/17/2017  . Sepsis (HCC) 07/16/2017  . Adjustment disorder with mixed anxiety and depressed mood   . MS (multiple sclerosis) (HCC)   . Palliative care by specialist   . Advance care planning   . Major depressive disorder, recurrent episode, moderate (HCC) 07/02/2017  . Staphylococcus epidermidis sepsis (HCC)   . PICC  line infection, subsequent encounter   . Severe sepsis (HCC) 06/26/2017  . Acute metabolic encephalopathy   . Acute respiratory failure (HCC)   . Acute pyelonephritis   . Normocytic anemia 05/25/2017  . Decubitus ulcer of coccygeal region, stage IV (HCC) 05/21/2017  . 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 Proteus infection 05/06/2017  . Foley catheter in place on admission 05/06/2017  . Hyperglycemia 05/05/2017  . Bacteriuria 05/05/2017  . Recurrent UTI   . Palliative care encounter   . Goals of care, counseling/discussion   . Pressure injury of skin 04/14/2017  . Aortic atherosclerosis (HCC) 03/14/2017  . Major depressive disorder, single episode, severe without psychosis (HCC) 01/02/2017  . Dysthymia 12/27/2016  . HTN (hypertension)   . Neurogenic bladder   . Labile blood pressure   . Leukocytosis   . Labile blood glucose   . Sleep disturbance   . Multiple sclerosis (HCC) 11/22/2016  . Thoracic root lesion 11/22/2016  . Neuropathic pain   . Leg weakness, bilateral 11/20/2016  . History of CVA (cerebrovascular accident)   . Uncontrolled type 1 diabetes mellitus with diabetic peripheral neuropathy (HCC)   . Diabetic acidosis (HCC) 11/15/2016  . Back pain 10/31/2016  . AKI (acute kidney injury) (HCC)   . Stable angina pectoris (HCC)   . Vitamin D deficiency 08/31/2014  . 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 Hyperlipidemia 10/06/2013  . Peripheral neuropathy 08/19/2010  . Type 1 diabetes mellitus (HCC) 07/06/2010  . Hypertension 07/06/2010  . Asthma 07/06/2010    Palliative Care Assessment & Plan   Patient Profile: 52 y.o. female  with past medical history of MS (resulting in neurogenic bladder, loss of use of BLE, weak  BUE, painful lower extremities, dysphagia), depression, osteomyelitis of the coccyx (s/p prior surigical debridements- per surg consult may need more),  aortic atherosclerosis, DM w/ hx of DKA, recurrent UTI's w/ sepsis,  admitted on 08/18/2017 with sepsis- chest xray indicating LLL pneumonia. Palliative medicine consulted for GOC.   Assessment/Recommendations/Plan   Continue current care  PMT will continue to follow and discuss GOC- Dakiyah was very clear that she wanted feeding tube if this was offered- she knows that dysphagia is part of the progression of her MS and she had thought this out and clearly delineated it in her advanced directives- very difficult situation- as I fear we've reached a point where this will not make a change in her overall illness trajectory.   Goals of Care and Additional Recommendations:  Limitations on Scope of Treatment: Full Scope Treatment  Code Status:  Limited code full scope treatment with no resuscitation if she is found without a pulse or respirations  Prognosis:   < 3 months  Discharge Planning:  Skilled Nursing Facility for rehab with Palliative care service follow-up  Care plan was discussed with patient.  Thank you for allowing the Palliative Medicine Team to assist in the care of this patient.   Time In: 0900 Time Out: 0925 Total Time 25 mins Prolonged Time Billed no      Greater than 50%  of this time was spent counseling and coordinating care related to the above assessment and plan.  [name], AGNP-C Palliative Medicine   Please contact Palliative Medicine Team phone at [phone] for questions and concerns.

## 2017-08-31 DIAGNOSIS — E1065 Type 1 diabetes mellitus with hyperglycemia: Secondary | ICD-10-CM

## 2017-08-31 DIAGNOSIS — E43 Unspecified severe protein-calorie malnutrition: Secondary | ICD-10-CM

## 2017-08-31 LAB — CBC
HEMATOCRIT: 21.4 % — AB (ref 36.0–46.0)
Hemoglobin: 7 g/dL — ABNORMAL LOW (ref 12.0–15.0)
MCH: 28.6 pg (ref 26.0–34.0)
MCHC: 32.7 g/dL (ref 30.0–36.0)
MCV: 87.3 fL (ref 78.0–100.0)
Platelets: 45 10*3/uL — ABNORMAL LOW (ref 150–400)
RBC: 2.45 MIL/uL — AB (ref 3.87–5.11)
RDW: 16.2 % — ABNORMAL HIGH (ref 11.5–15.5)
WBC: 1.9 10*3/uL — AB (ref 4.0–10.5)

## 2017-08-31 LAB — BASIC METABOLIC PANEL
ANION GAP: 3 — AB (ref 5–15)
BUN: <5 mg/dL — ABNORMAL LOW (ref 6–20)
CO2: 20 mmol/L — ABNORMAL LOW (ref 22–32)
Calcium: 7.2 mg/dL — ABNORMAL LOW (ref 8.9–10.3)
Chloride: 119 mmol/L — ABNORMAL HIGH (ref 98–111)
Creatinine, Ser: 0.78 mg/dL (ref 0.44–1.00)
GFR calc Af Amer: >60 mL/min (ref >60)
GFR calc non Af Amer: >60 mL/min (ref >60)
GLUCOSE: 200 mg/dL — AB (ref 70–99)
POTASSIUM: 4.1 mmol/L (ref 3.5–5.1)
Sodium: 142 mmol/L (ref 135–145)

## 2017-08-31 LAB — GLUCOSE, CAPILLARY
GLUCOSE-CAPILLARY: 126 mg/dL — AB (ref 70–99)
GLUCOSE-CAPILLARY: 129 mg/dL — AB (ref 70–99)
GLUCOSE-CAPILLARY: 190 mg/dL — AB (ref 70–99)
GLUCOSE-CAPILLARY: 213 mg/dL — AB (ref 70–99)
Glucose-Capillary: 169 mg/dL — ABNORMAL HIGH (ref 70–99)

## 2017-08-31 LAB — PHOSPHORUS: Phosphorus: 2.4 mg/dL — ABNORMAL LOW (ref 2.5–4.6)

## 2017-08-31 LAB — MAGNESIUM: MAGNESIUM: 1.6 mg/dL — AB (ref 1.7–2.4)

## 2017-08-31 LAB — ACTH: C206 ACTH: 30.5 pg/mL (ref 7.2–63.3)

## 2017-08-31 MED ORDER — GNP PEPPERMINT SPIRIT SPRT
Status: DC | PRN
  Filled 2017-08-31: qty 30

## 2017-08-31 MED ORDER — INSULIN GLARGINE 100 UNIT/ML ~~LOC~~ SOLN
3.0000 [IU] | Freq: Every day | SUBCUTANEOUS | Status: DC
  Administered 2017-08-31 – 2017-09-02 (×2): 3 [IU] via SUBCUTANEOUS
  Filled 2017-08-31 (×5): qty 0.03

## 2017-08-31 MED ORDER — INSULIN GLARGINE 100 UNIT/ML ~~LOC~~ SOLN: 1.0000 [IU] | Freq: Every day | SUBCUTANEOUS | Status: DC

## 2017-08-31 MED ORDER — POTASSIUM PHOSPHATES 15 MMOLE/5ML IV SOLN
10.0000 meq | Freq: Once | INTRAVENOUS | Status: AC
  Administered 2017-08-31: 10 meq via INTRAVENOUS
  Filled 2017-08-31: qty 2.27

## 2017-08-31 MED ORDER — MAGNESIUM SULFATE 2 GM/50ML IV SOLN
2.0000 g | Freq: Once | INTRAVENOUS | Status: AC
  Administered 2017-08-31: 2 g via INTRAVENOUS
  Filled 2017-08-31: qty 50

## 2017-08-31 NOTE — Progress Notes (Signed)
  Date: 08/31/2017  Patient name: Debra [name]  Medical record number: [id number]  Date of birth: 04/16/1965   I have seen and evaluated this patient and I have discussed the plan of care with the house staff. Please see their note for complete details. I concur with their findings with the following additions/corrections:   1. Hypoglycemia - resolved and now hyperglycemic on D5 125 / hr. Leave current rate and start very low dose lantus as type I DM and DKA prone.  2. 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 Agatroban caused transfusion requiring anemia so non heparin AC contraindicated. HgB now stable  3. Poor PO intake and severe protein calorie malnutrition - this is not reversible and will be life limiting. For thrush, she is on diflucan. For N, on haldol, sch zofran, without sig improvement in PO intake. I do not feel she is a feeding tube candidate (neither NG nor percutaneous), even though that was her preference in GOC discussions, due to low plts, sig abd wall edema, poor wound healing, immunosuppression, & chance that enteral feeding will worsen nausea.   Team is discussing with palliative care. Ms Pinela has no revertible medical issues. She remains critically ill with a poor prognosis. I am afraid that if Ms Neeser remains in the hospital, she will be exposed to harm from too aggressive medical care. She has not expressed an interest in inpt hospice or comfort care only. SNF with hospice is the best option but I am concerned that without a do not transfer option, she will be transferred right back to the ED where she will be treated as an "emergency" by physicians who do not know her and experience harm.   Burns Spain, MD 08/31/2017, 11:03 AM

## 2017-08-31 NOTE — Progress Notes (Addendum)
Daily Progress Note   Patient Name: Debra [name]       Date: 08/31/2017 DOB: [birth year]  Age: 52 y.o. MRN#: [id number] Attending Physician: Burns Spain, MD Primary Care Physician: [name], MD Admit Date: 08/18/2017  Reason for Consultation/Follow-up: Establishing goals of care and Non pain symptom management  Subjective: Debra [name] is lethargic this morning but arouses to my voice. She opens her eyes and answers yes or no questions appropriately.  I updated Debra [name] on her medical situation. I let her know that at this point giving her a feeding tube is not an option due to her multiple medical complications.  She denied that her legs hurt, acknowledged back pain, but declined pain medicine.  She wants to continue current care, shook her head no when I gently offered her transition to comfort and Hospice.  Debra [name] has always accepted any aggressive medical care that is offered. She will continue to accept any that is offered.   ROS  Length of Stay: 13  Current Medications: Scheduled Meds:  . baclofen  5 mg Oral TID  . chlorhexidine  15 mL Mouth Rinse BID  . collagenase   Topical Daily  . DULoxetine  30 mg Oral Daily  . fluconazole  200 mg Oral Daily  . haloperidol lactate  1 mg Intravenous Q6H  . insulin aspart  0-9 Units Subcutaneous Q4H  . insulin glargine  3 Units Subcutaneous Daily  . mouth rinse  15 mL Mouth Rinse q12n4p  . mirtazapine  45 mg Oral QHS  . modafinil  100 mg Oral Daily  . nutrition supplement (JUVEN)  1 packet Oral BID BM  . nystatin  5 mL Oral QID  . ondansetron (ZOFRAN) IV  4 mg Intravenous Q8H  . pregabalin  25 mg Oral TID  . protein supplement shake  11 oz Oral BID BM  . Teriflunomide  14 mg Oral Daily    Continuous Infusions: .  dextrose 5 % and 0.45 % NaCl with KCl 40 mEq/L 125 mL/hr at 08/31/17 0240    PRN Meds: acetaminophen **OR** acetaminophen, hydrALAZINE, HYDROmorphone (DILAUDID) injection, ondansetron **OR** ondansetron (ZOFRAN) IV, oxyCODONE, peppermint spirit, phenol, silver nitrate applicators, sodium chloride flush  Physical Exam  Skin: There is pallor.  Nursing note and vitals reviewed.  Vital Signs: BP (!) 146/93   Pulse 68   Temp 97.9 F (36.6 C) (Axillary)   Resp 20   Ht 5\' 3"  (1.6 m)   Wt 66.7 kg (147 lb)   SpO2 100%   BMI 26.04 kg/m  SpO2: SpO2: 100 % O2 Device: O2 Device: Room Air O2 Flow Rate: O2 Flow Rate (L/min): 3 L/min  Intake/output summary:   Intake/Output Summary (Last 24 hours) at 08/31/2017 1200 Last data filed at 08/31/2017 4098 Gross per 24 hour  Intake 1715.92 ml  Output 2400 ml  Net -684.08 ml   LBM: Last BM Date: 08/29/17 Baseline Weight: Weight: 54.4 kg (120 lb) Most recent weight: Weight: 66.7 kg (147 lb)       Palliative Assessment/Data: PPS: 10%    Flowsheet Rows     Most Recent Value  Intake Tab  Referral Department  Hospitalist  Unit at Time of Referral  Intermediate Care Unit  Palliative Care Primary Diagnosis  Other (Comment) [Multiple sclerosis (HCC)]  Date Notified  08/23/17  Palliative Care Type  Return patient Palliative Care  Reason for referral  Clarify Goals of Care  Date of Admission  08/18/17  Date first seen by Palliative Care  08/24/17  # of days Palliative referral response time  1 Day(s)  # of days IP prior to Palliative referral  5  Clinical Assessment  Psychosocial & Spiritual Assessment  Palliative Care Outcomes      Patient Active Problem List   Diagnosis Date Noted  . Severe episode of recurrent major depressive disorder, without psychotic features (HCC)   . Moderate episode of recurrent major depressive disorder (HCC)   . Healthcare-associated pneumonia   . Lobar pneumonia, unspecified organism (HCC)  08/21/2017  . Sacral osteomyelitis (HCC)   . Sacral decubitus ulcer, stage IV (HCC)   . Malnutrition of moderate degree 07/17/2017  . Sepsis (HCC) 07/16/2017  . Adjustment disorder with mixed anxiety and depressed mood   . MS (multiple sclerosis) (HCC)   . Palliative care by specialist   . Advance care planning   . Major depressive disorder, recurrent episode, moderate (HCC) 07/02/2017  . Staphylococcus epidermidis sepsis (HCC)   . PICC line infection, subsequent encounter   . Severe sepsis (HCC) 06/26/2017  . Acute metabolic encephalopathy   . Acute respiratory failure (HCC)   . Acute pyelonephritis   . Normocytic anemia 05/25/2017  . Decubitus ulcer of coccygeal region, stage IV (HCC) 05/21/2017  . 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 Proteus infection 05/06/2017  . Foley catheter in place on admission 05/06/2017  . Hyperglycemia 05/05/2017  . Bacteriuria 05/05/2017  . Recurrent UTI   . Palliative care encounter   . Goals of care, counseling/discussion   . Pressure injury of skin 04/14/2017  . Aortic atherosclerosis (HCC) 03/14/2017  . Major depressive disorder, single episode, severe without psychosis (HCC) 01/02/2017  . Dysthymia 12/27/2016  . HTN (hypertension)   . Neurogenic bladder   . Labile blood pressure   . Leukocytosis   . Labile blood glucose   . Sleep disturbance   . Multiple sclerosis (HCC) 11/22/2016  . Thoracic root lesion 11/22/2016  . Neuropathic pain   . Leg weakness, bilateral 11/20/2016  . History of CVA (cerebrovascular accident)   . Uncontrolled type 1 diabetes mellitus with diabetic peripheral neuropathy (HCC)   . Diabetic acidosis (HCC) 11/15/2016  . Back pain 10/31/2016  . AKI (acute kidney injury) (HCC)   .  Stable angina pectoris (HCC)   . Vitamin D deficiency 08/31/2014  . 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 Hyperlipidemia 10/06/2013    . Peripheral neuropathy 08/19/2010  . Type 1 diabetes mellitus (HCC) 07/06/2010  . Hypertension 07/06/2010  . Asthma 07/06/2010    Palliative Care Assessment & Plan   Patient Profile: 52 y.o. female  with past medical history of MS (resulting in neurogenic bladder, loss of use of BLE, weak BUE, painful lower extremities, dysphagia), depression, osteomyelitis of the coccyx (s/p prior surigical debridements- per surg consult may need more), aortic atherosclerosis, DM w/ hx of DKA, recurrent UTI's w/ sepsis,  admitted on 08/18/2017 with sepsis- chest xray indicating LLL pneumonia. Palliative medicine consulted for GOC.   Assessment/Recommendations/Plan   I discussed patient's status with her HCPOA and daughter- informed them that we may be reaching the limits of what medical care can be provided to improve Mariane's status. I let them know that a feeding tube is not a good option at this time due to it will cause more harm than it will help. Family was receptive to information.  I discussed with patient's HCPOA and daughter that my concern is that patient is heading into end of life situation. They are prepared that this may be the case and they may need to begin to make decisions on patient's behalf should she become unresponsive or unable to make decisions.  Will try peppermint spirits for hyperosmia r/t MS   Patient continues to desire life prolonging care and will accept what is offered.    Please involve patient in all discussions regarding her care- with stimulation she is able to arouse and listen and understand, this is very important to her. She appreciates being asked permission before she is touched or moved.   Goals of Care and Additional Recommendations:  Limitations on Scope of Treatment: Full Scope Treatment  Code Status:  Limited code  Prognosis:   Unable to determine - likely less than weeks due to limited nutritional intake  Discharge Planning:  To Be  Determined  Care plan was discussed with patient, HCPOA Aram Beecham), and patient's daughter.  Thank you for allowing the Palliative Medicine Team to assist in the care of this patient.   Time In: 1130 Time Out: 1205 Total Time 35 mins Prolonged Time Billed no      Greater than 50%  of this time was spent counseling and coordinating care related to the above assessment and plan.  [name], AGNP-C Palliative Medicine   Please contact Palliative Medicine Team phone at [phone] for questions and concerns.

## 2017-08-31 NOTE — Progress Notes (Signed)
CRITICAL VALUE ALERT  Critical Value:  hgb 7.0, phos 2.4, mg 1.6  Date & Time Notied:  0610  Provider Notified:Internal med. oncall  Orders Received/Actions taken:yes/N

## 2017-08-31 NOTE — Progress Notes (Addendum)
Paged provider to clarify order for Lantus and make aware patient is refusing all oral medications today. Provider placed order.

## 2017-08-31 NOTE — Progress Notes (Signed)
SLP Cancellation Note  Patient Details Name: Kineta Burbank MRN: [id number] DOB: [birth year]   Cancelled treatment:       Reason Eval/Treat Not Completed: Other (comment)(note pt continues with poor intake due to odynophagia)  Will follow for family education.    Chales Abrahams 08/31/2017, 8:15 AM Donavan Burnet, MS [facility] SLP [phone]

## 2017-08-31 NOTE — Progress Notes (Signed)
Subjective: Patient seen and evaluated at bedside. No acute events overnight. She still has not eaten anything and is not taking her pills today, per nursing. On trying to clarify with Ms. Suddeth, it seems that the trouble is more-so difficulty swallowing at this time, rather than pain with swallowing. She continues to complain of nausea, decreased appetite, and generalized abdominal discomfort. She denies any other pain.   Objective:  Vital signs in last 24 hours: Vitals:   08/31/17 0612 08/31/17 0713 08/31/17 0848 08/31/17 1221  BP: (!) 182/136 (!) 146/93    Pulse: 77 68    Resp: 20     Temp:   97.9 F (36.6 C) 98.2 F (36.8 C)  TempSrc:   Axillary Axillary  SpO2: 100%     Weight:      Height:       General: drowsy but arousable female, lying in bed in no acute distress. Cardiovascular: Regular rate. Normal rhythm. Holosystolic murmur heard at upper sternal borders. No gallops or friction rub.  Respiratory: normal chest excursion. Lungs clear to auscultation in anterior fields.  Abdominal exam: hypoactive bowel sounds. Abdomen is soft with mild tenderness to palpation throughout.  MSK: bilateral upper and lower extremity pitting edema.  Psych: depressed mood with flat affect. Answers questions appropriately.   Assessment/Plan:  Principal Problem:   Lobar pneumonia, unspecified organism ([place]) Active Problems:   Type 1 diabetes mellitus (HCC)   Hypertension   Multiple sclerosis (HCC)   Neurogenic bladder   Osteomyelitis of coccyx (HCC)   Sepsis (HCC)   Sacral decubitus ulcer, stage IV ([place])   Healthcare-associated pneumonia   Moderate episode of recurrent major depressive disorder (HCC)   Severe episode of recurrent major depressive disorder, without psychotic features ([place])   Severe protein-calorie malnutrition ([place])  1.Depression #Goals of Care Continue current regimen of Provil, Cymbalta, Lyrica, and Remeron. Spoke with the palliative team today to discuss [employer] again.  It is an extremely difficult situation, as we are unfortunately at the point that we are no longer able to do much for her from a medical standpoint. Palliative graciously spoke extensively with Ms. Stankus aunt Chauncey [place]) and daughter today; I was told that it seems they are aware we are moving towards end of life care and a decision will likely be made in terms of what their and Ms. Dubas's wishes are in regards to this over the weekend. Greatly appreciate palliative's time and and recommendations on this case.  -HCPOAJason Coop [phone]  2.Poor PO intake and severe protein calorie malnutrition this is not reversible and palliative has estimated she has approximately 3 months to live. Patient has complained of odynophagia this admission. She is currently on diflucan for oral candidiasis seen on exam previously. She continues to complain of nausea which is a chronic issue for her. We will continue scheduled zofran and haldol. Patient has indicated in [place] discussions that she would like a feeding tube placed if it were to become necessary. Palliative has indicated that it would not likely change her overall outcome, and I do not feel she is a good candidate for one given her low platelets, significant edema and the fact she is immunocompromised. Palliative kindly brought this up with Ms. Romano today, and she indicated she understood this could not be offered to her due to the risks significantly outweighing any benefit.    3.T1DM:Patient's hypoglycemia has resolved, and she is now hyperglycemic on D5 125/hr with blood glucose levels ranging from 170-210. Given that  she is still not eating, we will add back a very low dose of lantus at 3 units, discontinue short-acting insulin, continue current fluid rate, and monitor how blood sugars respond.    4.Isolated elevated alk phos and GTT -we will no longer pursue MRI, as it will not change patient's management.  3.Acute on chronic anemia.  Stable since discontinuing argatroban. Hgb 7.0 this morning. Continue to monitor and transfuse if drops below 7. No signs of active bleeding and patient is hemodynamically stable.   4. 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 Have stopped argatroban due to bleeding and acute on chronic anemia as mentioned above.   7. Hypothermia -resolved. labs revealed no signs of adrenal insufficiency.Cultures negative thus far. Second urine culture is pending. Suspect this was likely secondary to hypoglycemia on 7/1.  8.HCAP Chronic Sacral Osteomyelitis and Ulceration Completed 7-days of Meropenem. Afebrile and without leukocytosis. She is saturating well on RA. - Hydrotherapy when able (thrombocytopenia) - Will need repeat CXR in 6-weeks  9.Advanced Multiple Sclerosis Neurogenic Bladder with ChronicIndwellingFoleyCatheter Patient bed-bound at baseline prior to admission. Was on Teriflunomide 71m daily prior to admission. This was held 6/22 when she was transferred to ICU for pressor support.  -Continue homeTeriflunomide - Continue Baclofen 554mTID - Continue Oxycodone and Dilaudid prn for chronic pain   Dispo: Anticipated discharge in approximately 2-3 day(s).   BlModena Nunnery, DO 08/31/2017, 1:40 PM Pager: [phone]

## 2017-09-01 DIAGNOSIS — R112 Nausea with vomiting, unspecified: Secondary | ICD-10-CM

## 2017-09-01 DIAGNOSIS — F339 Major depressive disorder, recurrent, unspecified: Secondary | ICD-10-CM

## 2017-09-01 DIAGNOSIS — Z515 Encounter for palliative care: Secondary | ICD-10-CM

## 2017-09-01 DIAGNOSIS — E11622 Type 2 diabetes mellitus with other skin ulcer: Secondary | ICD-10-CM

## 2017-09-01 DIAGNOSIS — E1169 Type 2 diabetes mellitus with other specified complication: Secondary | ICD-10-CM

## 2017-09-01 LAB — BASIC METABOLIC PANEL
ANION GAP: 3 — AB (ref 5–15)
CO2: 20 mmol/L — AB (ref 22–32)
Calcium: 7.4 mg/dL — ABNORMAL LOW (ref 8.9–10.3)
Chloride: 118 mmol/L — ABNORMAL HIGH (ref 98–111)
Creatinine, Ser: 0.71 mg/dL (ref 0.44–1.00)
GFR calc Af Amer: >60 mL/min (ref >60)
Glucose, Bld: 108 mg/dL — ABNORMAL HIGH (ref 70–99)
Potassium: 4.9 mmol/L (ref 3.5–5.1)
Sodium: 141 mmol/L (ref 135–145)

## 2017-09-01 LAB — CBC
HEMATOCRIT: 24.3 % — AB (ref 36.0–46.0)
Hemoglobin: 7.8 g/dL — ABNORMAL LOW (ref 12.0–15.0)
MCH: 28.5 pg (ref 26.0–34.0)
MCHC: 32.1 g/dL (ref 30.0–36.0)
MCV: 88.7 fL (ref 78.0–100.0)
PLATELETS: 53 10*3/uL — AB (ref 150–400)
RBC: 2.74 MIL/uL — ABNORMAL LOW (ref 3.87–5.11)
RDW: 16.1 % — AB (ref 11.5–15.5)
WBC: 2.2 10*3/uL — ABNORMAL LOW (ref 4.0–10.5)

## 2017-09-01 LAB — PHOSPHORUS: PHOSPHORUS: 2.3 mg/dL — AB (ref 2.5–4.6)

## 2017-09-01 LAB — GLUCOSE, CAPILLARY
GLUCOSE-CAPILLARY: 110 mg/dL — AB (ref 70–99)
GLUCOSE-CAPILLARY: 120 mg/dL — AB (ref 70–99)
GLUCOSE-CAPILLARY: 96 mg/dL (ref 70–99)
Glucose-Capillary: 103 mg/dL — ABNORMAL HIGH (ref 70–99)

## 2017-09-01 LAB — MAGNESIUM: Magnesium: 2 mg/dL (ref 1.7–2.4)

## 2017-09-01 MED ORDER — KCL IN DEXTROSE-NACL 20-5-0.45 MEQ/L-%-% IV SOLN
INTRAVENOUS | Status: DC
  Administered 2017-09-01 – 2017-09-04 (×10): via INTRAVENOUS
  Administered 2017-09-05 (×2): 1000 mL via INTRAVENOUS
  Administered 2017-09-06 (×2): via INTRAVENOUS
  Filled 2017-09-01 (×17): qty 1000

## 2017-09-01 MED ORDER — SODIUM CHLORIDE 0.9 % IV SOLN
INTRAVENOUS | Status: DC | PRN
  Administered 2017-09-03: 13:00:00 via INTRAVENOUS

## 2017-09-01 MED ORDER — SODIUM PHOSPHATES 45 MMOLE/15ML IV SOLN
20.0000 mmol | Freq: Once | INTRAVENOUS | Status: AC
  Administered 2017-09-01: 20 mmol via INTRAVENOUS
  Filled 2017-09-01: qty 6.67

## 2017-09-01 MED ORDER — LORAZEPAM 2 MG/ML IJ SOLN
0.5000 mg | Freq: Four times a day (QID) | INTRAMUSCULAR | Status: DC | PRN
  Filled 2017-09-01: qty 1

## 2017-09-01 MED ORDER — LORAZEPAM 2 MG/ML IJ SOLN
0.2500 mg | Freq: Three times a day (TID) | INTRAMUSCULAR | Status: DC
Start: 2017-09-01 — End: 2017-09-07
  Administered 2017-09-01 – 2017-09-07 (×17): 0.25 mg via INTRAVENOUS
  Filled 2017-09-01 (×16): qty 1

## 2017-09-01 NOTE — Progress Notes (Signed)
Dr. Samuella Cota asked about progressive care monitoring.  Pt only on monitoring dt bair hugger and warming blanket.  No need for progressive care per MD. Equipment removed from room.  Pt stable at 96.8-97 degrees on warming blanket. Rectal pouch changed due to leaking.  Liquid yellow/brown Bm noted. Small skin tear noted on Right buttocks near rectal pouch.

## 2017-09-01 NOTE — Progress Notes (Signed)
Daily Progress Note   Patient Name: Debra [name]       Date: 09/01/2017 DOB: [birth year]  Age: 52 y.o. MRN#: [id number] Attending Physician: Burns Spain, MD Primary Care Physician: [name], MD Admit Date: 08/18/2017  Reason for Consultation/Follow-up: Establishing goals of care, Non pain symptom management, Pain control and Psychosocial/spiritual support  Subjective: Pt seen, chart reviewed. No family at the bedside. Pt is alert. Nodding her head yes/no ; answers appear to be relevant. She is non verbal. She denies pain. She states she is still nauseated despite haldol and zofran on a scheduled basis. She tells me she has had nausea for months to a year. She is afraid to eat because eating makes nausea worse and it hurts to swallow.   Length of Stay: 14  Current Medications: Scheduled Meds:  . baclofen  5 mg Oral TID  . chlorhexidine  15 mL Mouth Rinse BID  . collagenase   Topical Daily  . DULoxetine  30 mg Oral Daily  . fluconazole  200 mg Oral Daily  . haloperidol lactate  1 mg Intravenous Q6H  . insulin glargine  3 Units Subcutaneous Daily  . mouth rinse  15 mL Mouth Rinse q12n4p  . mirtazapine  45 mg Oral QHS  . modafinil  100 mg Oral Daily  . nutrition supplement (JUVEN)  1 packet Oral BID BM  . nystatin  5 mL Oral QID  . ondansetron (ZOFRAN) IV  4 mg Intravenous Q8H  . pregabalin  25 mg Oral TID  . protein supplement shake  11 oz Oral BID BM  . Teriflunomide  14 mg Oral Daily    Continuous Infusions: . sodium chloride    . dextrose 5 % and 0.45 % NaCl with KCl 20 mEq/L 75 mL/hr at 09/01/17 1046  . sodium phosphate  Dextrose 5% IVPB 20 mmol (09/01/17 1043)    PRN Meds: sodium chloride, acetaminophen **OR** acetaminophen, hydrALAZINE, HYDROmorphone  (DILAUDID) injection, ondansetron **OR** ondansetron (ZOFRAN) IV, oxyCODONE, peppermint spirit, phenol, silver nitrate applicators, sodium chloride flush  Physical Exam  Constitutional: She appears well-developed and well-nourished.  Acutely ill appearing older female  Cardiovascular: Normal rate.  Pulmonary/Chest: Effort normal.  Genitourinary:  Genitourinary Comments: Foley and rectal tube Rectal tube draining green liquid  Neurological: She is alert.  Pt is nonverbal but  appears to be answering appropriately by shaking her head yes/no  Skin: Skin is warm and dry.  Nursing note and vitals reviewed.           Vital Signs: BP 127/78   Pulse 72   Temp (!) 96.7 F (35.9 C) (Rectal)   Resp 13   Ht 5\' 3"  (1.6 m)   Wt 64 kg (141 lb)   SpO2 100%   BMI 24.98 kg/m  SpO2: SpO2: 100 % O2 Device: O2 Device: Room Air O2 Flow Rate: O2 Flow Rate (L/min): 3 L/min  Intake/output summary:   Intake/Output Summary (Last 24 hours) at 09/01/2017 1534 Last data filed at 09/01/2017 1046 Gross per 24 hour  Intake 130 ml  Output 1200 ml  Net -1070 ml   LBM: Last BM Date: (rectal pouch) Baseline Weight: Weight: 54.4 kg (120 lb) Most recent weight: Weight: 64 kg (141 lb)       Palliative Assessment/Data:    Flowsheet Rows     Most Recent Value  Intake Tab  Referral Department  Hospitalist  Unit at Time of Referral  Intermediate Care Unit  Palliative Care Primary Diagnosis  Other (Comment) [Multiple sclerosis (HCC)]  Date Notified  08/23/17  Palliative Care Type  Return patient Palliative Care  Reason for referral  Clarify Goals of Care  Date of Admission  08/18/17  Date first seen by Palliative Care  08/24/17  # of days Palliative referral response time  1 Day(s)  # of days IP prior to Palliative referral  5  Clinical Assessment  Psychosocial & Spiritual Assessment  Palliative Care Outcomes      Patient Active Problem List   Diagnosis Date Noted  . Severe protein-calorie  malnutrition (HCC)   . Severe episode of recurrent major depressive disorder, without psychotic features (HCC)   . Moderate episode of recurrent major depressive disorder (HCC)   . Healthcare-associated pneumonia   . Lobar pneumonia, unspecified organism (HCC) 08/21/2017  . Sacral osteomyelitis (HCC)   . Sacral decubitus ulcer, stage IV (HCC)   . Malnutrition of moderate degree 07/17/2017  . Sepsis (HCC) 07/16/2017  . Adjustment disorder with mixed anxiety and depressed mood   . MS (multiple sclerosis) (HCC)   . Palliative care by specialist   . Advance care planning   . Major depressive disorder, recurrent episode, moderate (HCC) 07/02/2017  . Staphylococcus epidermidis sepsis (HCC)   . PICC line infection, subsequent encounter   . Severe sepsis (HCC) 06/26/2017  . Acute metabolic encephalopathy   . Acute respiratory failure (HCC)   . Acute pyelonephritis   . Normocytic anemia 05/25/2017  . Decubitus ulcer of coccygeal region, stage IV (HCC) 05/21/2017  . 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 Proteus infection 05/06/2017  . Foley catheter in place on admission 05/06/2017  . Hyperglycemia 05/05/2017  . Bacteriuria 05/05/2017  . Recurrent UTI   . Palliative care encounter   . Goals of care, counseling/discussion   . Pressure injury of skin 04/14/2017  . Aortic atherosclerosis (HCC) 03/14/2017  . Major depressive disorder, single episode, severe without psychosis (HCC) 01/02/2017  . Dysthymia 12/27/2016  . HTN (hypertension)   . Neurogenic bladder   . Labile blood pressure   . Leukocytosis   . Labile blood glucose   . Sleep disturbance   . Multiple sclerosis (HCC) 11/22/2016  . Thoracic root lesion 11/22/2016  . Neuropathic pain   . Leg weakness, bilateral 11/20/2016  .  History of CVA (cerebrovascular accident)   . Uncontrolled type 1 diabetes mellitus with diabetic peripheral neuropathy (HCC)   .  Diabetic acidosis (HCC) 11/15/2016  . Back pain 10/31/2016  . AKI (acute kidney injury) (HCC)   . Stable angina pectoris (HCC)   . Vitamin D deficiency 08/31/2014  . 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 Hyperlipidemia 10/06/2013  . Peripheral neuropathy 08/19/2010  . Type 1 diabetes mellitus (HCC) 07/06/2010  . Hypertension 07/06/2010  . Asthma 07/06/2010    Palliative Care Assessment & Plan   Patient Profile: 52 y.o.femalewith past medical history of MS(resulting in neurogenic bladder, loss of use of BLE, weak BUE, painful lower extremities, dysphagia),depression, osteomyelitis of the coccyx (s/p prior surigical debridements- per surg consult may need more), aortic atherosclerosis, DM w/ hx of DKA, recurrent UTI's w/ sepsis, admitted on 6/22/2019with sepsis- chest xray indicating LLL pneumonia.   Palliative medicine consulted for GOC.Despite treating infections, pt exhibits pancytopenia and no clinical improvement. Chronic nausea persists ; pt on scheduled haldol as well as zofran   Recommendations/Plan:  Nausea: Steroids can assist in intractable nausea but with her compromised immune system and desire to treat any infections this may not be an option at this point. Ativan also in conjunction with haldol and zofran maybe beneficial. Staffed with Dr. Samuella Cota who was in agreements to add low dose scheduled ativan. Drugs in the class of antihistamines e.g. phenergan are the other choice to consider for intractable nausea.  Zyprexa also helpful with nausea and affect D2 receptors like haldol but has more of a robust effect on appetite. Recommend starting 5 mg qhs and monitor and titrate for effect   Code Status:    Code Status Orders  (From admission, onward)        Start     Ordered   08/25/17 0802  Limited resuscitation (code)  Continuous    Comments:  IF PATIENT IS FOUND WITHOUT PULSE AND NOT BREATHING DO NOT ATTEMPT CPR- IF SHE HAS A  PULSE, AND ONLY HAS DIFFICULTY BREATHING, SHE WOULD LIKE TO BE INTUBATED  Question Answer Comment  In the event of cardiac or respiratory ARREST: Initiate Code Blue, Call Rapid Response No   In the event of cardiac or respiratory ARREST: Perform CPR No   In the event of cardiac or respiratory ARREST: Perform Intubation/Mechanical Ventilation Yes   In the event of cardiac or respiratory ARREST: Use NIPPV/BiPAp only if indicated Yes   In the event of cardiac or respiratory ARREST: Administer ACLS medications if indicated No   In the event of cardiac or respiratory ARREST: Perform Defibrillation or Cardioversion if indicated No      08/25/17 0805    Code Status History    Date Active Date Inactive Code Status Order ID Comments User Context   08/18/2017 1223 08/25/2017 0805 Full Code [id number]  [name], MD ED   07/16/2017 0044 07/24/2017 2259 Full Code [id number]  Tobey Grim, NP ED   06/26/2017 1600 07/05/2017 2017 Full Code [id number]  Jeanella Craze, NP ED   05/21/2017 1316 05/25/2017 2210 Full Code [id number]  Claud Kelp, MD Inpatient   05/05/2017 0508 05/21/2017 1316 Full Code [id number]  Camelia Phenes, DO ED   04/12/2017 1607 04/22/2017 0938 Full Code [id number]  Otho Najjar, MD ED   03/14/2017 1840 03/16/2017 2111 Full Code [id number]  Eulah Pont, MD ED   01/02/2017 1317 02/15/2017 1420 Full Code [id number]  Deneise Lever, MD Inpatient  01/02/2017 1216 01/02/2017 1317 Full Code [id number]  Charm Rings, NP Inpatient   12/22/2016 1722 01/01/2017 1309 Full Code [id number]  Shari Prows, MD Inpatient   12/20/2016 2204 12/22/2016 1722 Full Code [id number]  Donnetta Hutching, MD ED   11/22/2016 1822 12/15/2016 2359 Full Code [id number]  Charlton Amor, PA-C Inpatient   11/22/2016 1822 11/22/2016 1822 Full Code [id number]  Charlton Amor, PA-C Inpatient   11/15/2016 2250 11/22/2016 1733 Full Code [id number]  Deneise Lever, MD Inpatient   11/15/2016 1653 11/15/2016  2250 Full Code [id number]  Deneise Lever, MD ED   02/24/2016 0120 02/26/2016 1847 Full Code [id number]  Fuller Plan, MD ED    Advance Directive Documentation     Most Recent Value  Type of Advance Directive  Healthcare Power of Attorney, Living will  Pre-existing out of facility DNR order (yellow form or pink MOST form)  -  "MOST" Form in Place?  -       Prognosis:  Poor prognosis in the setting of MS, protein calorie malnutrition with albumin of 1.1, anorexia, chronic UTI as well as osteo of coccyx  Discharge Planning:  To Be Determined  Care plan was discussed with Dr. Samuella Cota  Thank you for allowing the Palliative Medicine Team to assist in the care of this patient.   Time In: 1500 Time Out: 1540 Total Time 40 min Prolonged Time Billed  no       Greater than 50%  of this time was spent counseling and coordinating care related to the above assessment and plan. Staffed with Dr. Nicoletta Dress, NP  Please contact Palliative Medicine Team phone at [phone] for questions and concerns.

## 2017-09-01 NOTE — Progress Notes (Signed)
Patient axillary temperature not reading currently. Pt refusing oral and rectal temps. Notified MD on call. Will continue to monitor and maintain bair hugger on pt.

## 2017-09-01 NOTE — Progress Notes (Signed)
Pt is refusing all po meds, po intake, meals and mouth care. D5 1/2 ns w 20 kcl still continues

## 2017-09-01 NOTE — Progress Notes (Signed)
Pt consented to rectal temp, reading of 93 obtained. Notified MD on call and turned up temperature on bair hugger. Will continue to monitor.

## 2017-09-01 NOTE — Progress Notes (Signed)
Subjective:  Patient nodding and shaking head to answer questions but will not speak. She indicates that she is cold (this improved on re-evaluation), she still had dysphagia and nausea; she denies pain, denies shortness of breath.  Objective:  Vital signs in last 24 hours: Vitals:   09/01/17 0500 09/01/17 0608 09/01/17 0634 09/01/17 0800  BP:   (!) 156/98 (!) 166/107  Pulse:   67 66  Resp:   11 11  Temp:  (!) 93 F (33.9 C)  (!) 92.7 F (33.7 C)  TempSrc:  Rectal  Oral  SpO2:   100% 100%  Weight: 141 lb (64 kg)     Height:       Constitutional: NAD, barely opens eyes CV: RRR Resp: no increased work of breathing, CTAB on anterior lung fields Abd: soft, NDNT, diminished bowel sounds MSK: Large sacral ulcer dirty from leaking rectal tube but otherwise w/o surrounding induration or purulent drainage; small ulcers on bil legs with good granulation tissue and no sx of infection; general anasarca  Assessment/Plan:  Principal Problem:   Lobar pneumonia, unspecified organism (HCC) Active Problems:   Type 1 diabetes mellitus (HCC)   Hypertension   Multiple sclerosis (HCC)   Neurogenic bladder   Osteomyelitis of coccyx (HCC)   Sepsis (HCC)   Sacral decubitus ulcer, stage IV (HCC)   Healthcare-associated pneumonia   Moderate episode of recurrent major depressive disorder (HCC)   Severe episode of recurrent major depressive disorder, without psychotic features ([place])   Severe protein-calorie malnutrition (HCC)  Depression Goals of Care Continue current regimen of Provil, Cymbalta, Lyrica, and Remeron. Spoke with the palliative team today to discuss [place] again. It is an extremely difficult situation, as we are unfortunately at the point that we are no longer able to do much for her from a medical standpoint. Palliative graciously spoke extensively with Ms. Rauth aunt Chauncey [place]) and daughter yesterday; they are aware we are moving towards end of life care and a decision will likely be  made in terms of what their and Ms. Florio's wishes are in regards to this over the weekend. Greatly appreciate palliative's time and and recommendations on this case.HCPOA: Jason Coop [phone]  Poor PO intake and severe protein calorie malnutrition This is not reversible and palliative has estimated she has approximately 3 months to live. Patient has complained of odynophagia this admission. She is currently on diflucan for oral candidiasis seen on exam previously and presumed esophageal candidiasis. She continues to complain of nausea which is a chronic issue for her. We will continue scheduled zofran and haldol. Patient has indicated in [place] discussions that she would like a feeding tube placed if it were to become necessary. Palliative has indicated that it would not likely change her overall outcome, and I do not feel she is a good candidate for one given her low platelets, significant edema and the fact she is immunocompromised. Palliative kindly brought this up with Ms. Osberg today, and she indicated she understood this could not be offered to her due to the risks significantly outweighing any benefit.    Hypothermia: Previous workup revealed stable thyroid function and no adrenal insufficiency. She does have significant malnutrition which is likely the cause of her hypothermia. She does have leukopenia and in setting of hypothermia and her h/o infections we are worried that she has another infection; BCx have been negative x4d; Urine culture 7/4 is growing G-Rods, however she has a chronic indwelling cath and likely has colonization. Her chronic sacral  ulcer has been soiled by leaky rectal tube, but otherwise appears to be stable - it is certainly at high risk of persistent infection. No indication that she has a recurrent pneumonia but we may consider repeating CXR in the next couple of days to see if there is new infiltrate compared to admission (initially admitted with pna).  --f/u final Bx  and UCx --bair hugger  T1DM: --Lantus 3, SSI  Acute on chronic anemia: Stable since discontinuing argatroban. Hgb 7.8 this morning. Continue to monitor and transfuse if drops below 7. No signs of active bleeding and patient is hemodynamically stable.   Thrombocytopenia:  HIT antibodies came back positive; serotonin release assay is pending. Have stopped argatroban due to bleeding and acute on chronic anemia as mentioned above.   HCAP Chronic Sacral Osteomyelitis and Ulceration Completed 7-days of Meropenem; previously competed 8wk course of abx for chronic osteo. She is saturating well on RA. - Hydrotherapy when able (thrombocytopenia) - Will need repeat CXR in 6-weeks  Advanced Multiple Sclerosis Neurogenic Bladder with ChronicIndwellingFoleyCatheter Patient bed-bound at baseline prior to admission. Was on Teriflunomide '14mg'$  daily prior to admission. This was held 6/22 when she was transferred to ICU for pressor support, and she has refused it since transfer to the floor.  -Continue offering homeTeriflunomide - Continue Baclofen '5mg'$  TID - Continue Oxycodone and Dilaudid prn for chronic pain  Isolated elevated alk phos and GTT  She had a previously documented 2cm hypoechoic liver lesion noted on U/S in April 2019 and in setting of elevated alk phos and GTT we were going to further pursue this with an abdominal MRI, however in setting of entire patient presentation and likely short prognosis this will not be undertaken at this time.  Dispo: Anticipated discharge in approximately 7-10 day(s).   Alphonzo Grieve, MD 09/01/2017, 12:42 PM Pager: [phone]

## 2017-09-01 NOTE — Progress Notes (Signed)
Doctor on call paged and aware that body temp remains  93 rectally.  Bair blanket hose disconnected when assessed. Reattched and called for Kthermia module (cooling blanket used with increased temp to warm patient)  with recal temp probe. None available in facility. Will continue to monitor patient

## 2017-09-02 LAB — CULTURE, BLOOD (ROUTINE X 2)
Culture: NO GROWTH
Culture: NO GROWTH
SPECIAL REQUESTS: ADEQUATE

## 2017-09-02 LAB — CBC
HCT: 22.8 % — ABNORMAL LOW (ref 36.0–46.0)
Hemoglobin: 7.3 g/dL — ABNORMAL LOW (ref 12.0–15.0)
MCH: 28.6 pg (ref 26.0–34.0)
MCHC: 32 g/dL (ref 30.0–36.0)
MCV: 89.4 fL (ref 78.0–100.0)
PLATELETS: 59 10*3/uL — AB (ref 150–400)
RBC: 2.55 MIL/uL — ABNORMAL LOW (ref 3.87–5.11)
RDW: 15.9 % — AB (ref 11.5–15.5)
WBC: 2 10*3/uL — ABNORMAL LOW (ref 4.0–10.5)

## 2017-09-02 LAB — GLUCOSE, CAPILLARY
GLUCOSE-CAPILLARY: 95 mg/dL (ref 70–99)
Glucose-Capillary: 114 mg/dL — ABNORMAL HIGH (ref 70–99)
Glucose-Capillary: 59 mg/dL — ABNORMAL LOW (ref 70–99)
Glucose-Capillary: 68 mg/dL — ABNORMAL LOW (ref 70–99)

## 2017-09-02 LAB — BASIC METABOLIC PANEL
Anion gap: 3 — ABNORMAL LOW (ref 5–15)
CALCIUM: 7.5 mg/dL — AB (ref 8.9–10.3)
CO2: 20 mmol/L — ABNORMAL LOW (ref 22–32)
CREATININE: 0.69 mg/dL (ref 0.44–1.00)
Chloride: 119 mmol/L — ABNORMAL HIGH (ref 98–111)
GFR calc Af Amer: >60 mL/min (ref >60)
GFR calc non Af Amer: >60 mL/min (ref >60)
Glucose, Bld: 83 mg/dL (ref 70–99)
Potassium: 4.2 mmol/L (ref 3.5–5.1)
SODIUM: 142 mmol/L (ref 135–145)

## 2017-09-02 LAB — MAGNESIUM: Magnesium: 1.9 mg/dL (ref 1.7–2.4)

## 2017-09-02 LAB — C DIFFICILE QUICK SCREEN W PCR REFLEX
C Diff antigen: NEGATIVE
C Diff interpretation: NOT DETECTED
C Diff toxin: NEGATIVE

## 2017-09-02 LAB — PHOSPHORUS: PHOSPHORUS: 3.1 mg/dL (ref 2.5–4.6)

## 2017-09-02 MED ORDER — DEXTROSE 50 % IV SOLN
INTRAVENOUS | Status: AC
  Filled 2017-09-02: qty 50

## 2017-09-02 MED ORDER — DEXTROSE 50 % IV SOLN
INTRAVENOUS | Status: AC
  Administered 2017-09-02: 25 mL
  Filled 2017-09-02: qty 50

## 2017-09-02 MED ORDER — DEXTROSE 50 % IV SOLN
1.0000 | Freq: Once | INTRAVENOUS | Status: AC
  Administered 2017-09-03: 50 mL via INTRAVENOUS

## 2017-09-02 MED ORDER — SODIUM CHLORIDE 0.9 % IV SOLN
1.0000 g | Freq: Three times a day (TID) | INTRAVENOUS | Status: DC
  Administered 2017-09-02 – 2017-09-07 (×15): 1 g via INTRAVENOUS
  Filled 2017-09-02 (×16): qty 1

## 2017-09-02 NOTE — Progress Notes (Signed)
Subjective:  Patient nodding and shaking head to answer questions but will not speak. She continues to indicate that she feels cold and that her nausea and dysphagia are persistent.  We discussed that she likely has a UTI and when asked she indicated that she is in agreement with starting antibiotics. She is also in agreement with another family meeting in the next day or so, that way everyone is up to date on her condition, goals of care, and current plans.   Objective:  Vital signs in last 24 hours: Vitals:   09/01/17 1557 09/01/17 2200 09/02/17 0600 09/02/17 0800  BP:  (!) 148/83  119/80  Pulse:  79  75  Resp:  16  16  Temp: (!) 96.8 F (36 C) 98.3 F (36.8 C)  97.8 F (36.6 C)  TempSrc: Rectal Oral  Oral  SpO2:  98%  100%  Weight:   143 lb (64.9 kg)   Height:       Constitutional: NAD, opens eyes spontaneously CV: RRR Resp: no increased work of breathing, CTAB on anterior lung fields Abd: soft, NDNT, diminished bowel sounds  Assessment/Plan:  Principal Problem:   Lobar pneumonia, unspecified organism (HCC) Active Problems:   Type 1 diabetes mellitus (HCC)   Hypertension   Multiple sclerosis (HCC)   Neurogenic bladder   Osteomyelitis of coccyx (HCC)   Sepsis ([place])   Sacral decubitus ulcer, stage IV ([place])   Healthcare-associated pneumonia   Moderate episode of recurrent major depressive disorder (HCC)   Severe episode of recurrent major depressive disorder, without psychotic features ([place])   Severe protein-calorie malnutrition (HCC)   Intractable vomiting with nausea  Depression Goals of Care Continue current regimen of Provil, Cymbalta, Lyrica, and Remeron. Spoke with the palliative team today to discuss [place] again. It is an extremely difficult situation, as we are unfortunately at the point that we are no longer able to do much for her from a medical standpoint. Palliative graciously spoke extensively with Ms. Hagwood aunt Chauncey [place]) and daughter last week; they are  aware we are moving towards end of life care and a decision will likely be made in terms of what their and Ms. Duvall's wishes are in regards to this over the weekend. Greatly appreciate palliative's time and and recommendations on this case.HCPOA: Jason Coop [phone] --if we are able to, hope to make another meeting with patient, family, palliative and IM teams  Poor PO intake and severe protein calorie malnutrition This is not reversible and palliative has estimated she has approximately 3 months to live. Patient has complained of odynophagia this admission. She is currently on diflucan for oral candidiasis seen on exam previously and presumed esophageal candidiasis. She continues to complain of nausea which is a chronic issue for her. We will continue scheduled zofran and haldol; palliative added scheduled ativan yesterday in hopes of improving the nausea. Patient has indicated in [place] discussions that she would like a feeding tube placed if it were to become necessary. Palliative has indicated that it would not likely change her overall outcome, and I do not feel she is a good candidate for one given her low platelets, significant edema and the fact she is immunocompromised. We have discussed this with Ms. Demby (both IM and palliative teams) and she indicates understanding that this would not be offered at the present time.   --IV diflucan --head of bed up to 30% --scheduled zofran, haldol, ativan (qtc on my review of EKG is 438 today) --D5 1/2NS +10mqK --  f/u AM Bmet, Mag, Phos  Hypothermia Klebsiella Bacteruria: Previous workup revealed stable thyroid function and no adrenal insufficiency. She does have significant malnutrition which could be the cause of her hypothermia. She does have leukopenia and in setting of hypothermia and her h/o infections we are worried that she has another infection; BCx have been negative x4d; Urine culture 7/4 is growing Klebsiella pneumoniae with  sensitivities pending; due to chronic indwelling catheter she may be colonized however with clinical picture of leukopenia and hypothermia this may represent true UTI. Her chronic sacral ulcer has been soiled by leaky rectal tube, but otherwise appears to be stable - it is certainly at high risk of persistent infection. No indication that she has a recurrent pneumonia but we may consider repeating CXR in the next couple of days to see if there is new infiltrate compared to admission (initially admitted with pna).  --f/u final Bx and UCx sensitivities --bair hugger --start meropenem for K pneumoniae UTI as previously ESBL; will change if sensitivities indicate  Diarrhea: Patient with watery yellow/green diarrhea likely 2/2 no PO intake. C dif testing negative overnight.   T1DM: Had hypoglycemic episode overnight; will increase fluid rate again as she was stable on it previously --Lantus 3, SSI  Acute on chronic anemia: Stable since discontinuing argatroban. Hgb 7.3 this morning. Continue to monitor and transfuse if drops below 7. No signs of active bleeding and patient is hemodynamically stable.   Thrombocytopenia:  HIT antibodies came back positive; serotonin release assay is pending. Have stopped argatroban due to bleeding and acute on chronic anemia as mentioned above.   HCAP Chronic Sacral Osteomyelitis and Ulceration Completed 7-days of Meropenem; previously competed 8wk course of abx for chronic osteo. She is saturating well on RA. - Hydrotherapy when able (thrombocytopenia) - Will need repeat CXR in 6-weeks (~Aug 5th)  Advanced Multiple Sclerosis Neurogenic Bladder with ChronicIndwellingFoleyCatheter Patient bed-bound at baseline prior to admission. Was on Teriflunomide '14mg'$  daily prior to admission. This was held 6/22 when she was transferred to ICU for pressor support, and she has refused it since transfer to the floor.  -Continue offering homeTeriflunomide -  Continue Baclofen '5mg'$  TID - Continue Oxycodone and Dilaudid prn for chronic pain  Isolated elevated alk phos and GTT  She had a previously documented 2cm hypoechoic liver lesion noted on U/S in April 2019 and in setting of elevated alk phos and GTT we were going to further pursue this with an abdominal MRI, however in setting of entire patient presentation and likely short prognosis this will not be undertaken at this time.  Dispo: Anticipated discharge in approximately 7-10 day(s).   Alphonzo Grieve, MD 09/02/2017, 11:03 AM Pager: [phone]

## 2017-09-02 NOTE — Progress Notes (Signed)
Pharmacy Antibiotic Note  Debra [name] is a 52 y.o. female admitted on 08/18/2017 with UTI.  Pharmacy has been consulted for meropenem dosing. 7/4 UCx growing klebsiella pneumo. Previous UTI was ESBL, only sensitive to carbapenems. WBC 2.0 and patient hypothermic.   Plan: Meropenem 1 gm every 8 hours Follow up sensitivities and de-esc as appropriate  Height: 5\' 3"  (160 cm) Weight: 143 lb (64.9 kg) IBW/kg (Calculated) : 52.4  Temp (24hrs), Avg:97.4 F (36.3 C), Min:96.7 F (35.9 C), Max:98.3 F (36.8 C)  Recent Labs  Lab 08/29/17 0327  08/29/17 2101 08/30/17 0425 08/31/17 0351 09/01/17 0350 09/02/17 0355  WBC 2.6*   < > 2.3* 2.9* 1.9* 2.2* 2.0*  CREATININE 0.86  --   --  0.87 0.78 0.71 0.69   < > = values in this interval not displayed.    Estimated Creatinine Clearance: 76.2 mL/min (by C-G formula based on SCr of 0.69 mg/dL).    Allergies  Allergen Reactions  . Penicillins Anaphylaxis, Nausea And Vomiting and Rash    Has patient had a PCN reaction causing immediate rash, facial/tongue/throat swelling, SOB or lightheadedness with hypotension: Yes Has patient had a PCN reaction causing severe rash involving mucus membranes or skin necrosis: Yes Has patient had a PCN reaction that required hospitalization No Has patient had a PCN reaction occurring within the last 10 years: Yes If all of the above answers are "NO", then may proceed with Cephalosporin use.   . Heparin     "Heparin antibody positive; SRA pending"  . Lactose Intolerance (Gi) Other (See Comments)  . Pollen Extract Other (See Comments)    Seasonal Allergies  . Tape Rash    Thank you for allowing pharmacy to be a part of this patient's care.  Virdie Penning A Kaylianna Detert 09/02/2017 11:47 AM

## 2017-09-02 NOTE — Progress Notes (Signed)
Pts right arm below elbow enlarged, swelling new last pm.  Consult for IV to monitor dt PICC in right upper arm.

## 2017-09-02 NOTE — Progress Notes (Signed)
Pts rectal temp now 95.7.  Warming blanket placed on pt with continuous rectal temp monitoring. First attempted bair hugger without change to temperature.

## 2017-09-02 NOTE — Progress Notes (Signed)
Pt with profuse watery yellow stool. Unable to contain with rectal pouch, sacral wound dressing continues to become soiled. Pt does not appear to have any pain/distress. Resident on call notified, new orders received. Enteric precautions initiated

## 2017-09-02 NOTE — Progress Notes (Signed)
Dressing to sacrum changed.  No changes noted in yellow tissue. Undermining noted at [facility] to 6 o'clock. Yellow stingy tissue noted throughout wound. No odor noted and no drainage noted at this time.  Several skin tears noted around dressing with friable tissue noted. Barrier cream to perineal area for excoriated areas.

## 2017-09-03 DIAGNOSIS — R197 Diarrhea, unspecified: Secondary | ICD-10-CM

## 2017-09-03 DIAGNOSIS — R8271 Bacteriuria: Secondary | ICD-10-CM

## 2017-09-03 DIAGNOSIS — B961 Klebsiella pneumoniae [K. pneumoniae] as the cause of diseases classified elsewhere: Secondary | ICD-10-CM

## 2017-09-03 DIAGNOSIS — D72819 Decreased white blood cell count, unspecified: Secondary | ICD-10-CM

## 2017-09-03 LAB — CBC
HEMATOCRIT: 20.7 % — AB (ref 36.0–46.0)
HEMOGLOBIN: 6.6 g/dL — AB (ref 12.0–15.0)
MCH: 28.8 pg (ref 26.0–34.0)
MCHC: 31.9 g/dL (ref 30.0–36.0)
MCV: 90.4 fL (ref 78.0–100.0)
Platelets: 52 10*3/uL — ABNORMAL LOW (ref 150–400)
RBC: 2.29 MIL/uL — AB (ref 3.87–5.11)
RDW: 15.7 % — ABNORMAL HIGH (ref 11.5–15.5)
WBC: 2.3 10*3/uL — ABNORMAL LOW (ref 4.0–10.5)

## 2017-09-03 LAB — BASIC METABOLIC PANEL
Anion gap: 2 — ABNORMAL LOW (ref 5–15)
CHLORIDE: 122 mmol/L — AB (ref 98–111)
CO2: 18 mmol/L — AB (ref 22–32)
Calcium: 7.5 mg/dL — ABNORMAL LOW (ref 8.9–10.3)
Creatinine, Ser: 0.85 mg/dL (ref 0.44–1.00)
GFR calc Af Amer: >60 mL/min (ref >60)
GFR calc non Af Amer: >60 mL/min (ref >60)
Glucose, Bld: 107 mg/dL — ABNORMAL HIGH (ref 70–99)
Potassium: 3.9 mmol/L (ref 3.5–5.1)
Sodium: 142 mmol/L (ref 135–145)

## 2017-09-03 LAB — PREPARE RBC (CROSSMATCH)

## 2017-09-03 LAB — GLUCOSE, CAPILLARY
GLUCOSE-CAPILLARY: 178 mg/dL — AB (ref 70–99)
Glucose-Capillary: 135 mg/dL — ABNORMAL HIGH (ref 70–99)
Glucose-Capillary: 151 mg/dL — ABNORMAL HIGH (ref 70–99)
Glucose-Capillary: 166 mg/dL — ABNORMAL HIGH (ref 70–99)

## 2017-09-03 LAB — MAGNESIUM: Magnesium: 1.8 mg/dL (ref 1.7–2.4)

## 2017-09-03 LAB — PHOSPHORUS: Phosphorus: 3 mg/dL (ref 2.5–4.6)

## 2017-09-03 LAB — HEMOGLOBIN AND HEMATOCRIT, BLOOD
HCT: 26.6 % — ABNORMAL LOW (ref 36.0–46.0)
Hemoglobin: 8.5 g/dL — ABNORMAL LOW (ref 12.0–15.0)

## 2017-09-03 MED ORDER — FLUCONAZOLE IN SODIUM CHLORIDE 200-0.9 MG/100ML-% IV SOLN
200.0000 mg | INTRAVENOUS | Status: DC
  Administered 2017-09-03 – 2017-09-06 (×4): 200 mg via INTRAVENOUS
  Filled 2017-09-03 (×4): qty 100

## 2017-09-03 MED ORDER — SODIUM CHLORIDE 0.9% IV SOLUTION: Freq: Once | INTRAVENOUS | Status: DC

## 2017-09-03 MED ORDER — INSULIN ASPART 100 UNIT/ML ~~LOC~~ SOLN
0.0000 [IU] | Freq: Four times a day (QID) | SUBCUTANEOUS | Status: DC
  Administered 2017-09-03 – 2017-09-05 (×5): 2 [IU] via SUBCUTANEOUS
  Administered 2017-09-05: 1 [IU] via SUBCUTANEOUS
  Administered 2017-09-05 (×2): 2 [IU] via SUBCUTANEOUS
  Administered 2017-09-06: 1 [IU] via SUBCUTANEOUS
  Administered 2017-09-06 – 2017-09-07 (×2): 2 [IU] via SUBCUTANEOUS

## 2017-09-03 NOTE — Progress Notes (Signed)
New order received fromDr. Gwyneth Revels to transfuse 1 unit of blood. Will infuse and continue to monitor.

## 2017-09-03 NOTE — Progress Notes (Signed)
Nutrition Follow-up  DOCUMENTATION CODES:   Not applicable  INTERVENTION:  Continue Premier Protein BID, each supplement provides 160 calories and 30 grams of protein  Continue -1 packet Juven BID, each packet provides 80 calories, 8 grams of carbohydrate, and 14 grams of amino acids; supplement contains CaHMB, glutamine, and arginine, to promote wound healing  Follow GOC meetings  NUTRITION DIAGNOSIS:   Increased nutrient needs related to wound healing as evidenced by estimated needs. -ongoing  GOAL:   Patient will meet greater than or equal to 90% of their needs -unmet  MONITOR:   PO intake, Supplement acceptance, Labs, I & O's, Skin, Weight trends, Other (Comment)(GOC)  ASSESSMENT:   52 year old female who arrived to the ED from [facility] with AMS, hypoglycemia, hypothermia, and hypotension. PMH significant for MS, neurogenic bladder with chronic indwelling catheter and bowel incontinence, chronic sacral ulcers and osteomyelitis, multiple UTIs, and diabetes mellitus type 1.  Attempted to speak with patient today, but she was not able to say much.  Continues to eat poorly with no appetite, nausea, and swallowing pain.  Ongoing GOC discussions with palliative, deemed a poor candidate for feeding tube and will not be offered one at this time per MD. Poor prognosis. Another family meeting will be taking place today or tomorrow per MD.  Labs reviewed Medications reviewed and include:  Insulin, Remeron, Zofran D5 1/2 NS 20K+ at 126mL/hr --> 510 calories   Diet Order:   Diet Order           DIET - DYS 1 Room service appropriate? Yes with Assist; Fluid consistency: Thin  Diet effective now          EDUCATION NEEDS:   Not appropriate for education at this time  Skin:  Skin Assessment: Skin Integrity Issues: Skin Integrity Issues:: Stage III, Stage IV, Unstageable, DTI DTI: L heel Stage III: R heel Stage IV: sacrum Unstageable: R leg, L ischial tuberosity  Last BM:   08/27/2017  Height:   Ht Readings from Last 1 Encounters:  08/20/17 5\' 3"  (1.6 m)    Weight:   Wt Readings from Last 1 Encounters:  09/02/17 143 lb (64.9 kg)    Ideal Body Weight:  52.3 kg  BMI:  Body mass index is 25.33 kg/m.  Estimated Nutritional Needs:   Kcal:  1700-1900 kcal/day  Protein:  80-95 grams/day  Fluid:  1.7-1.9 L/day    Dionne Ano. Carsen Leaf, MS, RD LDN Inpatient Clinical Dietitian Pager [phone]

## 2017-09-03 NOTE — Progress Notes (Signed)
Subjective: Patient will occasionally nod or shake her head to answer questions, but mostly unresponsive this morning. She does not appear to be in any pain or distress. She did indicate the nausea was still present, but otherwise was not able to gather any further information. We discussed bringing her family in for a possible meeting to update everyone on her condition and where we go from here.   Objective:  Vital signs in last 24 hours: Vitals:   09/03/17 0412 09/03/17 0745 09/03/17 0900 09/03/17 0915  BP: 113/72 130/69 115/67 116/65  Pulse: 78 77 77 75  Resp: '20 18 18 18  '$ Temp: 98.4 F (36.9 C) 98 F (36.7 C) 98 F (36.7 C) 98 F (36.7 C)  TempSrc: Oral Rectal Rectal Rectal  SpO2: 100% 100% 100% 100%  Weight:      Height:       General: Chronically ill appearing female, lying in bed in NAD. Occasionally nods to some questions, but otherwise nonverbal.  Cardiovascular: regular rate and rhythm. SEM heard at upper sternal borders.  Respiratory: no increased work of breathing; lungs clear to auscultation in anterior fields. Abdominal: hypoactive bowel sounds; soft, non-distended. Indicates mild general tenderness to palpation in epigastric area.   Assessment/Plan:  Principal Problem:   Lobar pneumonia, unspecified organism ([place]) Active Problems:   Type 1 diabetes mellitus (HCC)   Hypertension   Multiple sclerosis (HCC)   Neurogenic bladder   Osteomyelitis of coccyx (HCC)   Sepsis ([place])   Sacral decubitus ulcer, stage IV ([place])   Healthcare-associated pneumonia   Moderate episode of recurrent major depressive disorder (HCC)   Severe episode of recurrent major depressive disorder, without psychotic features ([place])   Severe protein-calorie malnutrition (HCC)   Intractable vomiting with nausea  Depression Goals of Care Continue current regimen of Provil, Cymbalta, Lyrica, and Remeron. Will plan to speak with palliative today to hopefully have a family meeting in the near  future to discuss [place] and future plans. It is an extremely difficult situation, as we are unfortunately at the point that we are no longer able to do much for her from a medical standpoint. Palliative graciously spoke extensively with Ms. Market aunt Chauncey Reading) and daughter last week; they are aware we are moving towards end of life care. Greatly appreciate palliative's time and and recommendations on this case.HCPOA: Jason Coop [phone]  Poor PO intake and severe protein calorie malnutrition This is not reversible and palliative has unfortunately indicated very poor prognosis. Patient has complained of odynophagia this admission. She is currently on diflucan for oral candidiasis seen on exam previously and presumed esophageal candidiasis. She continues tocomplain of nausea which is a chronic issue for her.We will continuescheduled zofran and haldol; palliative added scheduled ativan yesterday in hopes of improving the nausea. Patient has indicated in [place] discussions that she would like a feeding tube placed if it were to become necessary. Palliative has indicated that it would not likely change her overall outcome, and I do not feel she is a good candidate for one given her low platelets, significant edema and the fact she is immunocompromised. We have discussed this with Ms. Ruddell (both IM and palliative teams) and she indicates understanding that this would not be offered at the present time.  --IV diflucan --head of bed up to 30% --scheduled zofran, haldol, ativan (qtc on my review of EKG is 438 today) --D5 1/2NS +9mqK --will hold off on further labs at this point; electrolytes and kidney function have remained stable.  Hypothermia Klebsiella Bacteruria: Previous workup revealed stable thyroid function and no adrenal insufficiency. She does have significant malnutrition which could be the cause of her hypothermia. She also has  leukopenia and in setting of hypothermia and her h/o  infections we are worried that she has another infection; BCx have been negative x5d; Urine culture 7/4 is growing Klebsiella pneumoniae with sensitivities pending. Due to chronic indwelling catheter she may be colonized; however, with clinical picture of leukopenia and hypothermia we went ahead and started Meropenem in case it was a true UTI. Her chronic sacral ulcer has been soiled by leaky rectal tube, but otherwise appears to be stable - it is certainly at high risk of persistent infection. Consider repeat CXR if her white count and hypothermia do not change, as she was originally admitted with pneumonia.  --f/u final Bx and UCx sensitivities --bair hugger --Day 2 of meropenem for K pneumoniae UTI as previously ESBL; will change if sensitivities indicate  Diarrhea: Patient with watery yellow/green diarrhea likely 2/2 no PO intake. C dif testing negative.   T1DM: Her blood sugars have been labile with tendency to drop given she has been NPO most admission. We will continue her on D5 with 1/2 NS at 125 with sensitive SSI to try and maintain normal blood glucose levels. We will d/c her low dose Lantus for now. We will decrease CBGs to q 6.    Acute on chronic anemia: Patient's hgb dropped to 6.6 from yesterday. Have ordered 1 unite pRBC and will follow-up on post-transfusion H&H. No signs of active bleeding and patient is hemodynamically stable.   Thrombocytopenia:  HIT antibodies came back positive; serotonin release assay is pending. Have stopped argatroban due to bleeding and acute on chronic anemia as mentioned above.   HCAP Chronic Sacral Osteomyelitis and Ulceration Completed 7-days of Meropenem; previously competed 8wk course of abx for chronic osteo. She is saturating well on RA. - Hydrotherapy when able (thrombocytopenia) - Will need repeat CXR in 6-weeks (~Aug 5th)  Advanced Multiple Sclerosis Neurogenic Bladder with ChronicIndwellingFoleyCatheter Patient bed-bound at  baseline prior to admission. Was on Teriflunomide '14mg'$  daily prior to admission. This was held 6/22 when she was transferred to ICU for pressor support, and she has refused it since transfer to the floor.  -Continue offering homeTeriflunomide - Continue Baclofen '5mg'$  TID - Continue Oxycodone and Dilaudid prn for chronic pain  Isolated elevated alk phos and GTT  She had a previously documented 2cm hypoechoic liver lesion noted on U/S in April 2019 and in setting of elevated alk phos and GTT we were going to further pursue this with an abdominal MRI, however in setting of entire patient presentation and likely short prognosis this will not be undertaken at this time.    Dispo: Anticipated discharge in approximately 7-10 day(s).   Modena Nunnery D, DO 09/03/2017, 11:37 AM Pager: [phone]

## 2017-09-03 NOTE — Progress Notes (Signed)
Inpatient Diabetes Program Recommendations  AACE/ADA: New Consensus Statement on Inpatient Glycemic Control (2019)  Target Ranges:  Prepandial:   less than 140 mg/dL      Peak postprandial:   less than 180 mg/dL (1-2 hours)      Critically ill patients:  140 - 180 mg/dL   Results for CHAELYNN, DOONEY (MRN [id number]) as of 09/03/2017 09:25  Ref. Range 09/02/2017 00:39 09/02/2017 15:29 09/02/2017 23:47 09/03/2017 00:24 09/03/2017 08:06  Glucose-Capillary Latest Ref Range: 70 - 99 mg/dL 419 (H) 95 59 (L) 622 (H) 135 (H)   Review of Glycemic Control  Outpatient Diabetes medications: Lantus 5 units QHS Current orders for Inpatient glycemic control: Lantus 3 units daily  Inpatient Diabetes Program Recommendations:  Insulin - Basal: Please decrease Lantus to 2 units daily.  Thanks, Orlando Penner, RN, MSN, CDE Diabetes Coordinator Inpatient Diabetes Program [phone] (Team Pager from 8am to 5pm)

## 2017-09-03 NOTE — Progress Notes (Signed)
Date and time results received: 09/03/17 06:22  (use smartphrase ".now" to insert current time)  Test:Hgb  Critical Value: Hgb 6.6  Name of Provider Notified: paged internal medicine   Orders Received? Or Actions Taken?  Awaiting a call back:

## 2017-09-03 NOTE — Progress Notes (Signed)
Hypoglycemic Event  CBG: 59 mg/dL  Treatment: N27 IV 50 mL  Symptoms: None  Follow-up CBG: Time:0024  CBG Result 159 mg/dL  Possible Reasons for Event: Inadequate meal intake  Inadequate meal  Comments/MD notified:Yes, via page and awaiting a call back    Crisoforo Oxford

## 2017-09-03 NOTE — Progress Notes (Signed)
CSW to continue to be available for discharge needs.   Osborne Casco Jamice Carreno LCSW [phone]

## 2017-09-04 ENCOUNTER — Other Ambulatory Visit: Payer: Self-pay

## 2017-09-04 DIAGNOSIS — E162 Hypoglycemia, unspecified: Secondary | ICD-10-CM

## 2017-09-04 LAB — BPAM RBC
Blood Product Expiration Date: 201908052359
ISSUE DATE / TIME: 201907080902
Unit Type and Rh: 5100

## 2017-09-04 LAB — GLUCOSE, CAPILLARY
GLUCOSE-CAPILLARY: 172 mg/dL — AB (ref 70–99)
Glucose-Capillary: 128 mg/dL — ABNORMAL HIGH (ref 70–99)
Glucose-Capillary: 153 mg/dL — ABNORMAL HIGH (ref 70–99)
Glucose-Capillary: 162 mg/dL — ABNORMAL HIGH (ref 70–99)

## 2017-09-04 LAB — TYPE AND SCREEN
ABO/RH(D): O POS
Antibody Screen: NEGATIVE
Unit division: 0

## 2017-09-04 LAB — CARBAPENEM RESISTANCE PANEL
CARBA RESISTANCE IMP GENE: NOT DETECTED
CARBA RESISTANCE OXA48 GENE: NOT DETECTED
CARBA RESISTANCE VIM GENE: NOT DETECTED
Carba Resistance KPC Gene: NOT DETECTED
Carba Resistance NDM Gene: NOT DETECTED

## 2017-09-04 LAB — SEROTONIN RELEASE ASSAY (SRA)
SRA .2 IU/mL UFH Ser-aCnc: 7 % (ref 0–20)
SRA 100IU/mL UFH Ser-aCnc: 6 % (ref 0–20)

## 2017-09-04 NOTE — Progress Notes (Signed)
Subjective: No acute events overnight. Ms. Kuennen unfortunately continues to decline overall. She is less responsive and looks uncomfortable, despite shaking her head that she's in any pain. She continues to refuse any oral medications. She indicates the nausea, odynophagia and dysphagia remain unchanged.   Objective:  Vital signs in last 24 hours: Vitals:   09/04/17 0217 09/04/17 0400 09/04/17 0615 09/04/17 1435  BP:   139/81 (!) 141/91  Pulse:   76 70  Resp:   17 20  Temp: 97.7 F (36.5 C) 97.8 F (36.6 C)  98.6 F (37 C)  TempSrc: Rectal Rectal  Oral  SpO2:   100% 100%  Weight:   153 lb (69.4 kg)   Height:       Physical exam: General: chronically ill appearing female, lying in bed in NAD. Will occasionally nod or shake head to questions but otherwise nonverbal. Diffuse anasarca   Cardiovascular: RRR. SEM heard at upper sternal borders. Respiratory: no increased work of breathing. Lungs CTA in anterior fields Abdominal: hypoactive bowel sounds; soft, non-distended. Appears uncomfortable when palpating abdomen throughout but no rebound or guarding.    Assessment/Plan:  Principal Problem:   Lobar pneumonia, unspecified organism ([place]) Active Problems:   Type 1 diabetes mellitus (HCC)   Hypertension   Multiple sclerosis (HCC)   Neurogenic bladder   Osteomyelitis of coccyx (HCC)   Sepsis (HCC)   Sacral decubitus ulcer, stage IV ([place])   Healthcare-associated pneumonia   Moderate episode of recurrent major depressive disorder (HCC)   Severe episode of recurrent major depressive disorder, without psychotic features ([place])   Severe protein-calorie malnutrition (HCC)   Intractable vomiting with nausea  Depression Goals of Care Continue current regimen of Provil, Cymbalta, Lyrica, and Remeron; unlikely a factor since patient has been refusing PO meds.  Palliative update: Spoke with Kasie from palliative medicine today after she and Dr. Rowe Pavy met with Ms. Sockwell and her daughter.  Please see their note for details. In brief summary, but both daughter and Chauncey Reading are very understanding that we are unfortunately approaching EOL with Ms. Gatling. The plan is to do a 3 day trial of continued IVF and Abx before making a final decision. Greatly appreciate palliative's time and and recommendations on this case. HCPOA: Jason Coop [phone]  Poor PO intake and severe protein calorie malnutrition This is not reversible and palliative has unfortunately indicated very poor prognosis. Patient has complained of odynophagia this admission. She is currently on diflucan for oral candidiasis seen on exam previously and presumed esophageal candidiasis. This is being treated with IV diflucan. She continues tocomplain of nausea which is a chronic issue for her.We will continuescheduled zofran and haldol; palliative added scheduled ativan yesterday in hopes of improving the nausea. We are not able to much else other than continued IVF for this as we have discussed she is a poor candidate for any other intervention such as PEG or TPN, and it would not change her declining clinical course.  --will hold off on further labs at this point; electrolytes and kidney function have remained stable  Hypothermia Klebsiella Bacteruria: -continuing course until further decision about EOL care has been made --bair hugger --Day 3 of meropenem for K pneumoniae UTI as previously ESBL; will change if sensitivities indicate  Diarrhea: Patient with watery yellow/green diarrhea likely 2/2 no PO intake. C dif testing negative.  T1DM: Blood sugars stable at this time. We will continue her on D5 with 1/2 NS at 125 with sensitive SSI to try and  maintain normal blood glucose levels.  We will decrease CBGs to q 6.    Acute on chronic anemia: Patient's follow-up hgb 8.5 after receiving 1 unit pRBCs yesterday. Will hold off on further labs given clinical status.   Thrombocytopenia:  HIT antibodies came  back positive; serotonin release assay is pending. Have stopped argatroban due to bleeding and acute on chronic anemia as mentioned above.   HCAP Chronic Sacral Osteomyelitis and Ulceration Completed 7-days of Meropenem; previously competed 8wk course of abx for chronic osteo. She is saturating well on RA. - Hydrotherapy when able (thrombocytopenia) - Will need repeat CXR in 6-weeks(~Aug 5th)  Advanced Multiple Sclerosis Neurogenic Bladder with ChronicIndwellingFoleyCatheter Patient bed-bound at baseline prior to admission. Was on Teriflunomide 12m daily prior to admission. This was held 6/22 when she was transferred to ICU for pressor support, and she has refused it since transfer to the floor.  -Continue offering homeTeriflunomide - Continue Baclofen 557mTID - Continue Oxycodone and Dilaudid prn for chronic pain  Isolated elevated alk phos and GTT  She had a previously documented 2cm hypoechoic liver lesion noted on U/S in April 2019 and in setting of elevated alk phos and GTT we were going to further pursue this with an abdominal MRI, however in setting of entire patient presentation and likely short prognosis this will not be undertaken at this time.     Dispo: Anticipated discharge in approximately 5-7 day(s).   BlModena Nunnery, DO 09/04/2017, 4:25 PM Pager: [phone]

## 2017-09-04 NOTE — Progress Notes (Signed)
SLP Cancellation Note  Patient Details Name: Debra [name] MRN: [id number] DOB: Apr 07, 1965   Cancelled treatment:       Reason Eval/Treat Not Completed: Other (comment) Pt continues to decline POs. Discussed with RN - SLP to sign off for now. Should intake increase, and should there be any concerns for swallowing at that time, please reconsult.   Maxcine Ham 09/04/2017, 2:22 PM  Maxcine Ham, M.A. CCC-SLP [phone]

## 2017-09-04 NOTE — Progress Notes (Signed)
Patient has a flat affect and is only responding with no to every question asked. Patient is refusing to eat. Very hard time encouraging patient to take oral medications. VS currently stable. Palliative team has been by and rounded on patient.

## 2017-09-04 NOTE — Progress Notes (Signed)
  Date: 09/04/2017  Patient name: Debra [name]  Medical record number: [id number]  Date of birth: 1965-07-14   I have seen and evaluated this patient and I have discussed the plan of care with the house staff. Please see their note for complete details. I concur with their findings with the following additions/corrections: [name] was seen on AM rounds with team. Subsequently, I discussed with Drs Chesley Mires and Samuella Cota. New GOC discussion with palliative - details in their note. Briefly, an additional 3 days trial of IVF and ABX prior to final decision.   Burns Spain, MD 09/04/2017, 4:33 PM

## 2017-09-04 NOTE — Progress Notes (Signed)
Daily Progress Note   Patient Name: Debra [name]       Date: 09/04/2017 DOB: 28-Aug-1965  Age: 52 y.o. MRN#: [id number] Attending Physician: Bartholomew Crews, MD Primary Care Physician: Aldine Contes, MD Admit Date: 08/18/2017  Reason for Consultation/Follow-up: Establishing goals of care  Subjective: I and Dr. Rowe Pavy met with patient's daughter in the presence of patient to discuss [place]. Debra [name] was very insightful and aware of patient's situation. Debra [name] notes that patient looks weaker today and Debra [name] believes that patient is nearing EOL. Debra [name] states that she does not want her Mom to suffer. 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  Debra [name] notes her Mom has a deep fear of dying and expressed to Debra [name] many times her desires to "keep her alive". Debra [name] also notes that her mom has told her aunt she wants to be comfortable, and Debra [name] is accepting that we have reached a point where keeping patient alive may be causing more harm than helping her.  Full comfort care options including Hospice were discussed. We discussed three day limited trial of continuing IV fluids and antibiotics to see if patient's condition improves or worsens. If improvement is noted, then can continue, however, if worsens, then can transition to comfort. Debra [name] believes this would be appropriate plan, however, would like to discuss with her Mom to ensure that her Mom is at peace with this as well.  Review of Systems  Unable to perform ROS: Medical condition    Length of Stay: 17  Current Medications: Scheduled Meds:  . sodium chloride   Intravenous Once  . baclofen  5 mg Oral TID  . chlorhexidine  15 mL Mouth Rinse BID  . collagenase   Topical Daily    . DULoxetine  30 mg Oral Daily  . haloperidol lactate  1 mg Intravenous Q6H  . insulin aspart  0-9 Units Subcutaneous Q6H  . LORazepam  0.25 mg Intravenous Q8H  . mouth rinse  15 mL Mouth Rinse q12n4p  . mirtazapine  45 mg Oral QHS  . modafinil  100 mg Oral Daily  . nutrition supplement (JUVEN)  1 packet Oral BID BM  . nystatin  5 mL Oral QID  . ondansetron (ZOFRAN) IV  4 mg Intravenous Q8H  . pregabalin  25  mg Oral TID  . protein supplement shake  11 oz Oral BID BM  . Teriflunomide  14 mg Oral Daily    Continuous Infusions: . sodium chloride 10 mL/hr at 09/03/17 1307  . dextrose 5 % and 0.45 % NaCl with KCl 20 mEq/L 125 mL/hr at 09/04/17 1206  . fluconazole (DIFLUCAN) IV 200 mg (09/04/17 1359)  . meropenem (MERREM) IV 1 g (09/04/17 0547)    PRN Meds: sodium chloride, acetaminophen **OR** acetaminophen, hydrALAZINE, HYDROmorphone (DILAUDID) injection, LORazepam, ondansetron **OR** ondansetron (ZOFRAN) IV, oxyCODONE, peppermint spirit, phenol, silver nitrate applicators, sodium chloride flush  Physical Exam  Constitutional:  cachetic  Cardiovascular: Normal rate.  Pulmonary/Chest: Effort normal.  Neurological:  Lethargic, arouses, nods yes and no appropriately  Psychiatric:  Flat affect, mostly nonverbal, some words in presence of family  Nursing note and vitals reviewed.           Vital Signs: BP (!) 141/91 (BP Location: Left Arm)   Pulse 70   Temp 98.6 F (37 C) (Oral)   Resp 20   Ht _0  (1.6 m)   Wt 69.4 kg (153 lb)   SpO2 100%   BMI 27.10 kg/m  SpO2: SpO2: 100 % O2 Device: O2 Device: Room Air O2 Flow Rate: O2 Flow Rate (L/min): 3 L/min  Intake/output summary:   Intake/Output Summary (Last 24 hours) at 09/04/2017 1458 Last data filed at 09/04/2017 0745 Gross per 24 hour  Intake 2442.73 ml  Output -  Net 2442.73 ml   LBM: Last BM Date: 09/04/17 Baseline Weight: Weight: 54.4 kg (120 lb) Most recent weight: Weight: 69.4 kg (153 lb)       Palliative  Assessment/Data: PPS: 10%   Flowsheet Rows     Most Recent Value  Intake Tab  Referral Department  Hospitalist  Unit at Time of Referral  Intermediate Care Unit  Palliative Care Primary Diagnosis  Other (Comment) [Multiple sclerosis (HCC)]  Date Notified  08/23/17  Palliative Care Type  Return patient Palliative Care  Reason for referral  Clarify Goals of Care  Date of Admission  08/18/17  Date first seen by Palliative Care  08/24/17  # of days Palliative referral response time  1 Day(s)  # of days IP prior to Palliative referral  5  Clinical Assessment  Psychosocial & Spiritual Assessment  Palliative Care Outcomes      Patient Active Problem List   Diagnosis Date Noted  . Intractable vomiting with nausea   . Severe protein-calorie malnutrition ([place])   . Severe episode of recurrent major depressive disorder, without psychotic features ([place])   . Moderate episode of recurrent major depressive disorder ([place])   . Healthcare-associated pneumonia   . Lobar pneumonia, unspecified organism ([place]) 08/21/2017  . Sacral osteomyelitis ([place])   . Sacral decubitus ulcer, stage IV ([place])   . Malnutrition of moderate degree 07/17/2017  . Sepsis ([place]) 07/16/2017  . Adjustment disorder with mixed anxiety and depressed mood   . MS (multiple sclerosis) ([place])   . Palliative care by specialist   . Advance care planning   . Major depressive disorder, recurrent episode, moderate ([place]) 07/02/2017  . Staphylococcus epidermidis sepsis ([place])   . PICC line infection, subsequent encounter   . Severe sepsis ([place]) 06/26/2017  . Acute metabolic encephalopathy   . Acute respiratory failure ([place])   . Acute pyelonephritis   . Normocytic anemia 05/25/2017  . Decubitus ulcer of coccygeal region, stage IV ([place]) 05/21/2017  . 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 Proteus infection 05/06/2017  . Foley catheter in place on admission  05/06/2017  . Hyperglycemia 05/05/2017  . Bacteriuria 05/05/2017  . Recurrent UTI   . Palliative care encounter   . Goals of care, counseling/discussion   . Pressure injury of skin 04/14/2017  . Aortic atherosclerosis ([place]) 03/14/2017  . Major depressive disorder, single episode, severe without psychosis ([place]) 01/02/2017  . Dysthymia 12/27/2016  . HTN (hypertension)   . Neurogenic bladder   . Labile blood pressure   . Leukocytosis   . Labile blood glucose   . Sleep disturbance   . Multiple sclerosis ([place]) 11/22/2016  . Thoracic root lesion 11/22/2016  . Neuropathic pain   . Leg weakness, bilateral 11/20/2016  . History of CVA (cerebrovascular accident)   . Uncontrolled type 1 diabetes mellitus with diabetic peripheral neuropathy ([place])   . Diabetic acidosis ([place]) 11/15/2016  . Back pain 10/31/2016  . AKI (acute kidney injury) ([place])   . Stable angina pectoris ([place])   . Vitamin D deficiency 08/31/2014  . 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 Hyperlipidemia 10/06/2013  . Peripheral neuropathy 08/19/2010  . Type 1 diabetes mellitus ([place]) 07/06/2010  . Hypertension 07/06/2010  . Asthma 07/06/2010    Palliative Care Assessment & Plan   Patient Profile: 52 y.o.femalewith past medical history of MS(resulting in neurogenic bladder, loss of use of BLE, weak BUE, painful lower extremities, dysphagia),depression, osteomyelitis of the coccyx (s/p prior surigical debridements- per surg consult may need more), aortic atherosclerosis, DM w/ hx of DKA, recurrent UTI's w/ sepsis, admitted on 6/22/2019with sepsis- chest xray indicating LLL pneumonia. Pneumonia has been treated but patient has not been eating, her status has continued to decline. She has pancytopenias and electrolyte derangements. She is continuing to be unable to maintain her temperature. She is having episodes of hypoglycemia. Palliative medicine consulted for  [place].  Assessment/Recommendations/Plan   Debra [name] (and in patient's Alda Ponder who agrees with what Debra [name] agrees with) agrees with 3 day limit of continued IV fluids and antibiotics- however, she wants to discuss this with Debra [name]  This may need to play out until Debra [name] declares herself  PMT will continue to monitor and communicate with HCPOA and patient  Goals of Care and Additional Recommendations:  Limitations on Scope of Treatment: Full Scope Treatment and No Surgical Procedures  Code Status:  Limited code  Prognosis:   Unable to determine- likely 2 weeks given patient's very poor status with no po intake, now declining medications, not maintaining temperatures  Discharge Planning:  To Be Determined- I worry about a hospital death for Debra [name] was discussed with Debra [name]- patient's daughter  Thank you for allowing the Palliative Medicine Team to assist in the care of this patient.   Time In: 1330 Time Out: 1430 Total Time 60 mins Prolonged Time Billed Yes      Greater than 50%  of this time was spent counseling and coordinating care related to the above assessment and plan.  Debra [name], AGNP-C Palliative Medicine   Please contact Palliative Medicine Team phone at [phone] for questions and concerns.

## 2017-09-04 NOTE — Progress Notes (Addendum)
Notified MD oncall that nurse Asia tried to irrgate foley unable to irrigate it. Bladder scanner results greater than 1000cc. Will continue to monitor pt. Nelda Marseille, RN (charge nurse)

## 2017-09-05 DIAGNOSIS — Z5329 Procedure and treatment not carried out because of patient's decision for other reasons: Secondary | ICD-10-CM

## 2017-09-05 DIAGNOSIS — E162 Hypoglycemia, unspecified: Secondary | ICD-10-CM

## 2017-09-05 LAB — URINE CULTURE: Culture: >=100000 — AB

## 2017-09-05 LAB — GLUCOSE, CAPILLARY
GLUCOSE-CAPILLARY: 165 mg/dL — AB (ref 70–99)
GLUCOSE-CAPILLARY: 171 mg/dL — AB (ref 70–99)
GLUCOSE-CAPILLARY: 175 mg/dL — AB (ref 70–99)
Glucose-Capillary: 135 mg/dL — ABNORMAL HIGH (ref 70–99)
Glucose-Capillary: 142 mg/dL — ABNORMAL HIGH (ref 70–99)

## 2017-09-05 NOTE — Progress Notes (Signed)
Daily Progress Note   Patient Name: Debra [name]       Date: 09/05/2017 DOB: 10/28/1965  Age: 52 y.o. MRN#: [id number] Attending Physician: Burns Spain, MD Primary Care Physician: [name], MD Admit Date: 08/18/2017  Reason for Consultation/Follow-up: Establishing goals of care  Subjective: Patient is less responsive this morning. She shakes her head no when asked if she's in pain or uncomfortable. She shakes her head no when asked if she has any worries. She does not answer any other questions.  I called Debra [name] and spoke with her. She spoke with all family members and they are in agreement that if Debra [name] does not improve in the next few days they agree with transition to Hospice ROS  Length of Stay: 18  Current Medications: Scheduled Meds:  . sodium chloride   Intravenous Once  . baclofen  5 mg Oral TID  . chlorhexidine  15 mL Mouth Rinse BID  . collagenase   Topical Daily  . DULoxetine  30 mg Oral Daily  . haloperidol lactate  1 mg Intravenous Q6H  . insulin aspart  0-9 Units Subcutaneous Q6H  . LORazepam  0.25 mg Intravenous Q8H  . mouth rinse  15 mL Mouth Rinse q12n4p  . mirtazapine  45 mg Oral QHS  . modafinil  100 mg Oral Daily  . nutrition supplement (JUVEN)  1 packet Oral BID BM  . nystatin  5 mL Oral QID  . ondansetron (ZOFRAN) IV  4 mg Intravenous Q8H  . pregabalin  25 mg Oral TID  . protein supplement shake  11 oz Oral BID BM  . Teriflunomide  14 mg Oral Daily    Continuous Infusions: . sodium chloride 10 mL/hr at 09/03/17 1307  . dextrose 5 % and 0.45 % NaCl with KCl 20 mEq/L 1,000 mL (09/05/17 0753)  . fluconazole (DIFLUCAN) IV Stopped (09/04/17 1500)  . meropenem (MERREM) IV 1 g (09/05/17 0557)    PRN Meds: sodium chloride,  acetaminophen **OR** acetaminophen, hydrALAZINE, HYDROmorphone (DILAUDID) injection, LORazepam, ondansetron **OR** ondansetron (ZOFRAN) IV, oxyCODONE, peppermint spirit, phenol, silver nitrate applicators, sodium chloride flush  Physical Exam          Vital Signs: BP 99/65 (BP Location: Left Arm)   Pulse 84   Temp 97.7 F (36.5 C) (Oral)   Resp 17  Ht 5\' 3"  (1.6 m)   Wt 69.4 kg (153 lb)   SpO2 100%   BMI 27.10 kg/m  SpO2: SpO2: 100 % O2 Device: O2 Device: Room Air O2 Flow Rate: O2 Flow Rate (L/min): 3 L/min  Intake/output summary:   Intake/Output Summary (Last 24 hours) at 09/05/2017 1134 Last data filed at 09/05/2017 0935 Gross per 24 hour  Intake 3053.83 ml  Output 1575 ml  Net 1478.83 ml   LBM: Last BM Date: 09/05/17 Baseline Weight: Weight: 54.4 kg (120 lb) Most recent weight: Weight: 69.4 kg (153 lb)       Palliative Assessment/Data:    Flowsheet Rows     Most Recent Value  Intake Tab  Referral Department  Hospitalist  Unit at Time of Referral  Intermediate Care Unit  Palliative Care Primary Diagnosis  Other (Comment) [Multiple sclerosis (HCC)]  Date Notified  08/23/17  Palliative Care Type  Return patient Palliative Care  Reason for referral  Clarify Goals of Care  Date of Admission  08/18/17  Date first seen by Palliative Care  08/24/17  # of days Palliative referral response time  1 Day(s)  # of days IP prior to Palliative referral  5  Clinical Assessment  Psychosocial & Spiritual Assessment  Palliative Care Outcomes      Patient Active Problem List   Diagnosis Date Noted  . Hypoglycemia   . Intractable vomiting with nausea   . Severe protein-calorie malnutrition (HCC)   . Severe episode of recurrent major depressive disorder, without psychotic features (HCC)   . Moderate episode of recurrent major depressive disorder (HCC)   . Healthcare-associated pneumonia   . Lobar pneumonia, unspecified organism (HCC) 08/21/2017  . Sacral osteomyelitis (HCC)    . Sacral decubitus ulcer, stage IV (HCC)   . Malnutrition of moderate degree 07/17/2017  . Sepsis (HCC) 07/16/2017  . Adjustment disorder with mixed anxiety and depressed mood   . MS (multiple sclerosis) (HCC)   . Palliative care by specialist   . Advance care planning   . Major depressive disorder, recurrent episode, moderate (HCC) 07/02/2017  . Staphylococcus epidermidis sepsis (HCC)   . PICC line infection, subsequent encounter   . Severe sepsis (HCC) 06/26/2017  . Acute metabolic encephalopathy   . Acute respiratory failure (HCC)   . Acute pyelonephritis   . Normocytic anemia 05/25/2017  . Decubitus ulcer of coccygeal region, stage IV (HCC) 05/21/2017  . 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 Proteus infection 05/06/2017  . Foley catheter in place on admission 05/06/2017  . Hyperglycemia 05/05/2017  . Bacteriuria 05/05/2017  . Recurrent UTI   . Palliative care encounter   . Goals of care, counseling/discussion   . Pressure injury of skin 04/14/2017  . Aortic atherosclerosis (HCC) 03/14/2017  . Major depressive disorder, single episode, severe without psychosis (HCC) 01/02/2017  . Dysthymia 12/27/2016  . HTN (hypertension)   . Neurogenic bladder   . Labile blood pressure   . Leukocytosis   . Labile blood glucose   . Sleep disturbance   . Multiple sclerosis (HCC) 11/22/2016  . Thoracic root lesion 11/22/2016  . Neuropathic pain   . Leg weakness, bilateral 11/20/2016  . History of CVA (cerebrovascular accident)   . Uncontrolled type 1 diabetes mellitus with diabetic peripheral neuropathy (HCC)   . Diabetic acidosis (HCC) 11/15/2016  . Back pain 10/31/2016  . AKI (acute kidney injury) (HCC)   . Stable angina pectoris (HCC)   .  Vitamin D deficiency 08/31/2014  . 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 Hyperlipidemia 10/06/2013  . Peripheral neuropathy 08/19/2010  .  Type 1 diabetes mellitus (HCC) 07/06/2010  . Hypertension 07/06/2010  . Asthma 07/06/2010    Palliative Care Assessment & Plan   Patient Profile: 52 y.o.femalewith past medical history of MS(resulting in neurogenic bladder, loss of use of BLE, weak BUE, painful lower extremities, dysphagia),depression, osteomyelitis of the coccyx (s/p prior surigical debridements- per surg consult may need more), aortic atherosclerosis, DM w/ hx of DKA, recurrent UTI's w/ sepsis, admitted on 6/22/2019with sepsis- chest xray indicating LLL pneumonia. Pneumonia has been treated but patient has not been eating, her status has continued to decline. She has pancytopenias and electrolyte derangements. She is continuing to be unable to maintain her temperature. She is having episodes of hypoglycemia. Palliative medicine consulted for [place].   Assessment/Recommendations/Plan   Continue current care  PMT will keep daily contact with family  Anticipate transition to full comfort and Hospice in the next two days  Goals of Care and Additional Recommendations:  Limitations on Scope of Treatment: Minimize Medications and No Artificial Feeding  Code Status:  Limited code  Prognosis:   < 2 weeks  Discharge Planning:  To Be Determined  Care plan was discussed with patient's daughterPenni [name].  Thank you for allowing the Palliative Medicine Team to assist in the care of this patient.   Time In: 1100 Time Out: 1135 Total Time 35 mins Prolonged Time Billed no      Greater than 50%  of this time was spent counseling and coordinating care related to the above assessment and plan.  [name], AGNP-C Palliative Medicine   Please contact Palliative Medicine Team phone at [phone] for questions and concerns.

## 2017-09-05 NOTE — Progress Notes (Signed)
  Date: 09/05/2017  Patient name: Debra [name]  Medical record number: [id number]  Date of birth: 1965-05-17   I have seen and evaluated this patient and I have discussed the plan of care with the house staff. Please see their note for complete details. I concur with their findings with the following additions/corrections: Debra [name] was seen on AM rounds with team.  Debra [name] was less responsive today, only nodding her head for certain questions.  She indicated she had no pain and was not cold.  She refused a physical examination by Dr. Chesley [name] today.  Her her goals of care discussion, she is getting another 2 days of IV antibiotics and IV fluids although I do not anticipate she makes any progress with these therapies as she has not over the past 10 days that I have been following her.  Of note, her final urine culture grew not only the Klebsiella which we are treating but stenotrophomonas.  Dr. Chesley [name] discussed with pharmacy who recommended a FQ if We wanted to treat this.  It is not even clear that she has a urinary tract infection as this could simply be a colonization.  Adding another antibiotic is not going to improve her outcome and would only provide increased harm, increasing her risk for C. difficile and continuing the antibiotic associated associated diarrhea that she already has.  Debra Spain, MD 09/05/2017, 3:28 PM

## 2017-09-05 NOTE — Progress Notes (Signed)
Subjective: Debra [name] was seen and evaluated at bedside. No acute events overnight. She is less responsive this morning, with occasional delayed nods to certain questions. She indicated she was not in any pain today and did not feel cold.   Objective:  Vital signs in last 24 hours: Vitals:   09/04/17 2111 09/05/17 0000 09/05/17 0623 09/05/17 1452  BP: 108/73  99/65 (!) 103/55  Pulse: 68  84 84  Resp: _0 Temp: 98 F (36.7 C) (!) 97.4 F (36.3 C) 97.7 F (36.5 C) 99 F (37.2 C)  TempSrc: Oral Oral Oral   SpO2: 100%  100% 100%  Weight:      Height:       Physical Exam Debra [name] did not wish to be examined today; did not appear in any distress.   Assessment/Plan:  Principal Problem:   Lobar pneumonia, unspecified organism ([place]) Active Problems:   Type 1 diabetes mellitus (HCC)   Hypertension   Multiple sclerosis (HCC)   Neurogenic bladder   Osteomyelitis of coccyx (HCC)   Sepsis (HCC)   Sacral decubitus ulcer, stage IV ([place])   Healthcare-associated pneumonia   Moderate episode of recurrent major depressive disorder (HCC)   Severe episode of recurrent major depressive disorder, without psychotic features ([place])   Severe protein-calorie malnutrition (HCC)   Intractable vomiting with nausea   Hypoglycemia  Depression Goals of Care Continue current regimen of Provil, Cymbalta, Lyrica, and Remeron; unlikely a factor since patient has been refusing PO meds.  Palliative update: Per palliative note today, all of her family are in agreement that if she shows no improvement on the 3 day trial of IVF and Abx, we will transition to Hospice . Greatly appreciate palliative's time and and recommendations on this case. HCPOA: [facility] [phone]  Poor PO intake and severe protein calorie malnutrition This is not reversible and palliativehas unfortunately indicated very poor prognosis.She continues to refuse anything PO. Patient has complained of odynophagia this  admission. She is currently on diflucan for oral candidiasis seen on exam previously and presumed esophageal candidiasis. This is being treated with IV diflucan. She continues tocomplain of nausea which is a chronic issue for her.We will continuescheduled zofran and haldol; palliative added scheduled ativan yesterday in hopes of improving the nausea. We are not able to much else other than continued IVF for this as we have discussed she is a poor candidate for any other intervention such as PEG or TPN, and it would not change her declining clinical course.  --will hold off on further labs at this point; electrolytes and kidney function have remained stable  Hypothermia Klebsiella Bacteruria: --temperature stable on bair hugger --Day 4 ofmeropenem for K pneumoniae UTI as previously ESBL - Of note, her final urine culture also grew Stenotrophomonas. Pharmacy recommended a fluoroquinolone. After further discussion, it was decided that additional antibiotics would not change her course or outcome, increase her risk of C. Diff and may in fact worsen the diarrhea she already has. Will plan to continue Abx as part of her 3 day trial per palliative for another 2 days.    Diarrhea: Patient with watery yellow/green diarrhea likely 2/2 no PO intake. C dif testing negative.  T1DM: Blood sugars stable at this time. We will continue her on D5 with 1/2 NS at 125 with sensitive SSI. Decrease frequency of CBG checks.    Acute on chronic anemia: Patient's follow-up hgb 8.5 after receiving 1 unit pRBCs yesterday. Will hold off on  further labs given clinical status.   Thrombocytopenia:  HIT antibodies came back positive; serotonin release assay is pending. Have stopped argatroban due to bleeding and acute on chronic anemia as mentioned above.   HCAP Chronic Sacral Osteomyelitis and Ulceration Completed 7-days of Meropenem; previously competed 8wk course of abx for chronic osteo. She is saturating  well on RA. - Hydrotherapy when able (thrombocytopenia) - Will need repeat CXR in 6-weeks(~Aug 5th)  Advanced Multiple Sclerosis Neurogenic Bladder with ChronicIndwellingFoleyCatheter Patient bed-bound at baseline prior to admission. Was on Teriflunomide 29m daily prior to admission. This was held 6/22 when she was transferred to ICU for pressor support, and she has refused it since transfer to the floor.  -Continue offering homeTeriflunomide - Continue Baclofen 549mTID - Continue Oxycodone and Dilaudid prn for chronic pain  Isolated elevated alk phos and GTT  She had a previously documented 2cm hypoechoic liver lesion noted on U/S in April 2019 and in setting of elevated alk phos and GTT we were going to further pursue this with an abdominal MRI, however in setting of entire patient presentation and likely short prognosis this will not be undertaken at this time.    Dispo: Anticipated discharge in approximately 3-4 day(s).   BlModena Nunnery, DO 09/05/2017, 4:14 PM Pager: [phone]

## 2017-09-06 DIAGNOSIS — Z66 Do not resuscitate: Secondary | ICD-10-CM

## 2017-09-06 DIAGNOSIS — R4182 Altered mental status, unspecified: Secondary | ICD-10-CM

## 2017-09-06 DIAGNOSIS — T68XXXA Hypothermia, initial encounter: Secondary | ICD-10-CM

## 2017-09-06 LAB — GLUCOSE, CAPILLARY
GLUCOSE-CAPILLARY: 122 mg/dL — AB (ref 70–99)
Glucose-Capillary: 157 mg/dL — ABNORMAL HIGH (ref 70–99)
Glucose-Capillary: 130 mg/dL — ABNORMAL HIGH (ref 70–99)

## 2017-09-06 NOTE — Progress Notes (Signed)
Subjective: This morning we were paged by RN to come see Debra [name] for altered mental status. Rapid response was there when we arrived. After evaluating her, she was essentially unchanged clinically from yesterday when we saw her. She remained minimally responsive, but would shake her head yes or no with some delay. She indicated that she was not in any pain. Debra [name] with Palliative also came and saw the patient and agreed not much has changed since yesterday. Debra [name] called to update Debra [name] (daughter) and Debra [name]) that Debra [name]'s condition continues to deteriorate. They are in agreement to full DNR status and request comfort care with Hospice tomorrow.   Objective:  Vital signs in last 24 hours: Vitals:   09/06/17 0500 09/06/17 0545 09/06/17 0854 09/06/17 0914  BP:   114/63 114/62  Pulse:   77 76  Resp:   18   Temp:  97.6 F (36.4 C) 97.6 F (36.4 C)   TempSrc:  Rectal    SpO2:   100% 100%  Weight: 134 lb (60.8 kg)     Height:       General: minimally responsive. Will occasionally nod yes or no after about a minute.  Cardiovascular: regular rate with normal rhythm. SEM at upper sternal borders.  Respiratory: using some accessory muscles to breath, but in no respiratory distress. Lungs clear to auscultation anteriorly    Assessment/Plan:  Principal Problem:   Lobar pneumonia, unspecified organism ([place]) Active Problems:   Type 1 diabetes mellitus (HCC)   Hypertension   Multiple sclerosis (HCC)   Neurogenic bladder   Osteomyelitis of coccyx (HCC)   Sepsis ([place])   Sacral decubitus ulcer, stage IV ([place])   Healthcare-associated pneumonia   Moderate episode of recurrent major depressive disorder (HCC)   Severe episode of recurrent major depressive disorder, without psychotic features ([place])   Severe protein-calorie malnutrition (HCC)   Intractable vomiting with nausea   Hypoglycemia   Hypothermia   Goals of Care/Code status Please see palliative note for further details.  Debra [name] called to update family on condition, and they are in agreement to full DNR with transition to comfort care and inpatient Hospice tomorrow. Will continue current treatments for today and transition accordingly tomorrrow. Greatly appreciate palliative's time and and recommendations on this case. HCPOA: Debra [name] [phone]  Poor PO intake and severe protein calorie malnutrition This is not reversible and palliativehas unfortunately indicated very poor prognosis.She continues to refuse anything PO. Patient has complained of odynophagia this admission. She is currently on diflucan for oral candidiasis seen on exam previously and presumed esophageal candidiasis.This is being treated with IV diflucan.We will continue the rest of nausea treatment per palliative recommendations as she transitions to comfort care.   Hypothermia Klebsiella Bacteruria: --temperature stable on bair hugger; can continue this as part of comfort care --Day5 ofmeropenem for K pneumoniae UTI as previously ESBL. Will discontinue tomorrow.    Diarrhea: Patient with watery yellow/green diarrhea likely 2/2 no PO intake. C dif testing negative.  T1DM: Blood sugars stable at this time.We will continue her on D5 with 1/2 NS at 125 with sensitive SSI for the remainder of today and d/c when she transitions to hospice.  Acute on chronic anemia: Stable   Thrombocytopenia:  HIT antibodies came back positive; serotonin release assay is pending. Have stopped argatroban due to bleeding and acute on chronic anemia as mentioned above.   HCAP Chronic Sacral Osteomyelitis and Ulceration Completed 7-days of Meropenem; previously competed 8wk course of abx for chronic osteo.  She is saturating well on RA. - Hydrotherapy when able (thrombocytopenia) - Will need repeat CXR in 6-weeks(~Aug 5th)  Advanced Multiple Sclerosis Neurogenic Bladder with ChronicIndwellingFoleyCatheter Patient bed-bound at  baseline prior to admission. Was on Teriflunomide '14mg'$  daily prior to admission. This was held 6/22 when she was transferred to ICU for pressor support, and she has refused it since transfer to the floor.  -Continue offering homeTeriflunomide - Continue Baclofen '5mg'$  TID - Continue Oxycodone and Dilaudid prn for chronic pain  Isolated elevated alk phos and GTT  She had a previously documented 2cm hypoechoic liver lesion noted on U/S in April 2019 and in setting of elevated alk phos and GTT we were going to further pursue this with an abdominal MRI, however in setting of entire patient presentation did not pursue any further.  Dispo: Anticipated discharge to inpatient hospice tomorrow.   Debra [name] D, DO 09/06/2017, 1:35 PM Pager: [phone]

## 2017-09-06 NOTE — Progress Notes (Addendum)
  Date: 09/06/2017  Patient name: Debra [name]  Medical record number: [id number]  Date of birth: 04-25-1965   I have seen and evaluated this patient and I have discussed the plan of care with the house staff. Please see their note for complete details. I concur with their findings with the following additions/corrections: Team called to room by RN for altered mental status.; On arrival, rapid response in room. I cancelled ABG. Pt's mental status same today as yesterday - takes 60 sec to nod yes or no. There is a concerned pt will soon require intubation., Intubation not clinically appropriate for pt due to terminal status. Kasie of palliative at bedside, agrees. Pt's partial code cancelled and Debra [name] is now DNR. Palliaitive care to talk with family.   Burns Spain, MD 09/06/2017, 9:22 AM

## 2017-09-06 NOTE — Progress Notes (Signed)
RT called to bedside for pt being less responsive. Pt unable to follow commands and only responds to sternal rub with minimal response. Rapid Response RN paged to come to bedside. RN to call MD

## 2017-09-06 NOTE — Progress Notes (Signed)
Pt's rectal thermometer having trouble staying in place. Temperature reading at 95.8 rectally. Pt warm to touch. MD made aware. Will continue to attempt getting a better temperature reading.

## 2017-09-06 NOTE — Progress Notes (Signed)
CSW received consult regarding Hospice placement. CSW spoke with patient's POA, Aram Beecham. She stated that she prefers [facility]. CSW sent referral for assessment and hopeful transition to Hospice facility tomorrow.   Osborne Casco Atthew Coutant LCSW [phone]

## 2017-09-06 NOTE — Progress Notes (Addendum)
Palliative note: no charge- full note to follow.  Called to patient's room for decreased responsiveness. She appears minimally decreased, with minimal nodding after taking time to process. This is not much changed from yesterday. Discussed with Dr. Rogelia Boga and I agree that partial code and intubation is not medically indicated as patient is dying and would be futile as well as previous discussion with HCPOA whereas decisions were made that if patient were to decline or not show improvement she will be transitioned to comfort care measures only. Will change patient's status to Do Not Resuscitate.   [name], AGNP-C Palliative Medicine  Please call Palliative Medicine team phone with any questions [phone]. For individual providers please see AMION.

## 2017-09-06 NOTE — Progress Notes (Signed)
Pharmacy Antibiotic Note  Debra [name] is a 52 y.o. female admitted on 08/18/2017 with UTI.  Pharmacy consulted  09/02/17 for meropenem dosing for klebsiella pneumo UTI. Patient has h/o previous UTI was ESBL, only sensitive to carbapenems.   Day #5 Merrem for Klebsiella UTI - sensitive to Imipenem (carbapenem) only ; carabapenem resistance not detected. Urine Cx also grew > 100 K/ml Stenotrophomona which is generally resistant to carbapenems.  ID pharmacist discussed this with ITMS, Dr. Chesley Mires on 7/10 and recommended to add fluoroquinolone if aggressive treatment desired. - s/p completed 8 weeks of abx for sepsis: LLL pneumonia, chronic sacral ulcer stage IV, likely osteomyelitis of coccyx.  Afeb, WBC 4.8k>>>1.9k>>2.3k, last checked on 7/8 SCr 0.85 on 7/8, stable, CrCl ~ 65 ml/min.  Plan: Continue Meropenem 1 gm every 8 hours MD plans to stop antibiotics tomorrow. Plan is  transition to comfort care and inpatient Hospice tomorrow  Height: 5\' 3"  (160 cm) Weight: 134 lb (60.8 kg) IBW/kg (Calculated) : 52.4  Temp (24hrs), Avg:97.9 F (36.6 C), Min:97.6 F (36.4 C), Max:98.3 F (36.8 C)  Recent Labs  Lab 08/31/17 0351 09/01/17 0350 09/02/17 0355 09/03/17 0355  WBC 1.9* 2.2* 2.0* 2.3*  CREATININE 0.78 0.71 0.69 0.85    Estimated Creatinine Clearance: 65.5 mL/min (by C-G formula based on SCr of 0.85 mg/dL).    Allergies  Allergen Reactions  . Penicillins Anaphylaxis, Nausea And Vomiting and Rash    Has patient had a PCN reaction causing immediate rash, facial/tongue/throat swelling, SOB or lightheadedness with hypotension: Yes Has patient had a PCN reaction causing severe rash involving mucus membranes or skin necrosis: Yes Has patient had a PCN reaction that required hospitalization No Has patient had a PCN reaction occurring within the last 10 years: Yes If all of the above answers are "NO", then may proceed with Cephalosporin use.   . Lactose Intolerance (Gi) Other (See  Comments)  . Pollen Extract Other (See Comments)    Seasonal Allergies  . Tape Rash   Antimicrobials this admission: aztreonam 6/22 x1 levofloxacin 6/22 x1 linezolid 6/22 >> 6/26 Meropenem 6/22 >> 6/28, 7/7>> Flucon PO 7/3>>7/4 - has refused since, no dose in 4 days>texted IMTS> Fluconazole IV 7/8>> Nystatin oral 6/27>>(refusing most doses since 7/3) Santyl daily 6/23>>  Microbiology results: 6/22blood x 2 - negative 6/22 MRSA PCR Negative  7/2 blood x 2 - negative 7/7 C diff - negative 7/4 urine: > 100 K/ml Klebsiella (prior ESBL - 05/14/17) sensitive to imipenem only ; carabpenem resistance not detected  and > 100 K/ml Stenotrophomonas- is generally resistant to carbapenems    Thank you for allowing pharmacy to be a part of this patient's care. Noah Delaine, RPh Clinical Pharmacist Please check AMION for all [facility] Pharmacy phone numbers After 10:00 PM, call Main Pharmacy [phone] 09/06/2017 3:05 PM

## 2017-09-06 NOTE — Progress Notes (Addendum)
Daily Progress Note   Patient Name: Debra [name]       Date: 09/06/2017 DOB: November 21, 1965  Age: 52 y.o. MRN#: [id number] Attending Physician: Burns Spain, MD Primary Care Physician: [name], MD Admit Date: 08/18/2017  Reason for Consultation/Follow-up: Establishing goals of care  Subjective: Rapid response called to evaluate patient this morning due to what was thought to be change in status. Upon evaluation there was not significant change. Respiratory status stable. She does appear weaker and slower to respond.  I called [facility] and they agree to full DNR status. I do not believe intubation or code is medically indicated and would not change patient's trajectory- they agree.  We discussed that there has been no change in patient's status. She is not maintaining her temperatures. She continues to not eat.  Aram Beecham states that she feels that Najha does not want to continue to live, but just can't say it. Aram Beecham would like to transition to comfort care and request residential Hospice tomorrow.    Review of Systems  Unable to perform ROS: Acuity of condition    Length of Stay: 19  Current Medications: Scheduled Meds:  . sodium chloride   Intravenous Once  . baclofen  5 mg Oral TID  . chlorhexidine  15 mL Mouth Rinse BID  . collagenase   Topical Daily  . DULoxetine  30 mg Oral Daily  . haloperidol lactate  1 mg Intravenous Q6H  . insulin aspart  0-9 Units Subcutaneous Q6H  . LORazepam  0.25 mg Intravenous Q8H  . mouth rinse  15 mL Mouth Rinse q12n4p  . mirtazapine  45 mg Oral QHS  . modafinil  100 mg Oral Daily  . nutrition supplement (JUVEN)  1 packet Oral BID BM  . nystatin  5 mL Oral QID  . ondansetron (ZOFRAN) IV  4 mg Intravenous Q8H  .  pregabalin  25 mg Oral TID  . protein supplement shake  11 oz Oral BID BM  . Teriflunomide  14 mg Oral Daily    Continuous Infusions: . sodium chloride 10 mL/hr at 09/03/17 1307  . dextrose 5 % and 0.45 % NaCl with KCl 20 mEq/L 125 mL/hr at 09/06/17 0032  . fluconazole (DIFLUCAN) IV 200 mg (09/05/17 1232)  . meropenem (MERREM) IV 1 g (09/06/17 0541)  PRN Meds: sodium chloride, acetaminophen **OR** acetaminophen, hydrALAZINE, HYDROmorphone (DILAUDID) injection, LORazepam, ondansetron **OR** ondansetron (ZOFRAN) IV, oxyCODONE, peppermint spirit, phenol, silver nitrate applicators, sodium chloride flush  Physical Exam  Cardiovascular: Normal rate and regular rhythm.  Pulmonary/Chest: Effort normal.  Neurological:  lethargic  Skin: Skin is warm and dry.  Psychiatric:  Flat affect  Nursing note and vitals reviewed.           Vital Signs: BP 114/62   Pulse 76   Temp 97.6 F (36.4 C)   Resp 18   Ht 5\' 3"  (1.6 m)   Wt 60.8 kg (134 lb)   SpO2 100%   BMI 23.74 kg/m  SpO2: SpO2: 100 % O2 Device: O2 Device: Room Air O2 Flow Rate: O2 Flow Rate (L/min): 3 L/min  Intake/output summary:   Intake/Output Summary (Last 24 hours) at 09/06/2017 1050 Last data filed at 09/06/2017 4098 Gross per 24 hour  Intake 3074.78 ml  Output 100 ml  Net 2974.78 ml   LBM: Last BM Date: 09/05/17 Baseline Weight: Weight: 54.4 kg (120 lb) Most recent weight: Weight: 60.8 kg (134 lb)       Palliative Assessment/Data: PPS: 10%   Flowsheet Rows     Most Recent Value  Intake Tab  Referral Department  Hospitalist  Unit at Time of Referral  Intermediate Care Unit  Palliative Care Primary Diagnosis  Other (Comment) [Multiple sclerosis (HCC)]  Date Notified  08/23/17  Palliative Care Type  Return patient Palliative Care  Reason for referral  Clarify Goals of Care  Date of Admission  08/18/17  Date first seen by Palliative Care  08/24/17  # of days Palliative referral response time  1 Day(s)  #  of days IP prior to Palliative referral  5  Clinical Assessment  Psychosocial & Spiritual Assessment  Palliative Care Outcomes      Patient Active Problem List   Diagnosis Date Noted  . Hypoglycemia   . Intractable vomiting with nausea   . Severe protein-calorie malnutrition (HCC)   . Severe episode of recurrent major depressive disorder, without psychotic features (HCC)   . Moderate episode of recurrent major depressive disorder (HCC)   . Healthcare-associated pneumonia   . Lobar pneumonia, unspecified organism (HCC) 08/21/2017  . Sacral osteomyelitis (HCC)   . Sacral decubitus ulcer, stage IV (HCC)   . Malnutrition of moderate degree 07/17/2017  . Sepsis (HCC) 07/16/2017  . Adjustment disorder with mixed anxiety and depressed mood   . MS (multiple sclerosis) (HCC)   . Palliative care by specialist   . Advance care planning   . Major depressive disorder, recurrent episode, moderate (HCC) 07/02/2017  . Staphylococcus epidermidis sepsis (HCC)   . PICC line infection, subsequent encounter   . Severe sepsis (HCC) 06/26/2017  . Acute metabolic encephalopathy   . Acute respiratory failure (HCC)   . Acute pyelonephritis   . Normocytic anemia 05/25/2017  . Decubitus ulcer of coccygeal region, stage IV (HCC) 05/21/2017  . 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 Proteus infection 05/06/2017  . Foley catheter in place on admission 05/06/2017  . Hyperglycemia 05/05/2017  . Bacteriuria 05/05/2017  . Recurrent UTI   . Palliative care encounter   . Goals of care, counseling/discussion   . Pressure injury of skin 04/14/2017  . Aortic atherosclerosis (HCC) 03/14/2017  . Major depressive disorder, single episode, severe without psychosis (HCC) 01/02/2017  . Dysthymia 12/27/2016  . HTN (hypertension)   .  Neurogenic bladder   . Labile blood pressure   . Leukocytosis   . Labile blood glucose   . Sleep disturbance     . Multiple sclerosis (HCC) 11/22/2016  . Thoracic root lesion 11/22/2016  . Neuropathic pain   . Leg weakness, bilateral 11/20/2016  . History of CVA (cerebrovascular accident)   . Uncontrolled type 1 diabetes mellitus with diabetic peripheral neuropathy (HCC)   . Diabetic acidosis (HCC) 11/15/2016  . Back pain 10/31/2016  . AKI (acute kidney injury) (HCC)   . Stable angina pectoris (HCC)   . Vitamin D deficiency 08/31/2014  . 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 Hyperlipidemia 10/06/2013  . Peripheral neuropathy 08/19/2010  . Type 1 diabetes mellitus (HCC) 07/06/2010  . Hypertension 07/06/2010  . Asthma 07/06/2010    Palliative Care Assessment & Plan   Patient Profile: 52 y.o.femalewith past medical history of MS(resulting in neurogenic bladder, loss of use of BLE, weak BUE, painful lower extremities, dysphagia),depression, osteomyelitis of the coccyx (s/p prior surigical debridements- per surg consult may need more), aortic atherosclerosis, DM w/ hx of DKA, recurrent UTI's w/ sepsis, admitted on 6/22/2019with sepsis- chest xray indicating LLL pneumonia. Pneumonia has been treated but patient has not been eating, her status has continued to decline. She has pancytopenias and electrolyte derangements. She is continuing to be unable to maintain her temperature. She is having episodes of hypoglycemia.Palliative medicine consulted for GOC.   Assessment/Recommendations/Plan   DNR  Plan for full transition to comfort and referral to residential hospice tomorrow  HCPOA- Aram Beecham understands that full transition to comfort will include stopping IV fluids and medications, stopping medications that are not intended for comfort and supporting patient through a natural dying process.   Goals of Care and Additional Recommendations:  Limitations on Scope of Treatment: Minimize Medications and Full Scope Treatment  Code  Status:  DNR  Prognosis:   < 2 weeks due to no po intake, plan for transition to full comfort and requesting Hospice bed tomorrow  Discharge Planning:  Hospice facility  Care plan was discussed with patient's HCPOA- Aram Beecham and Penni Bombard  Thank you for allowing the Palliative Medicine Team to assist in the care of this patient.   Time In: 0945 Time Out: 1100 Total Time 75 minutes Prolonged Time Billed Yes      Greater than 50%  of this time was spent counseling and coordinating care related to the above assessment and plan.  [name], AGNP-C Palliative Medicine   Please contact Palliative Medicine Team phone at [phone] for questions and concerns.

## 2017-09-06 NOTE — Significant Event (Signed)
Rapid Response Event Note  Overview: Time Called: 0901 Arrival Time: 0904 Event Type: Respiratory, Neurologic  Initial Focused Assessment: Per Rn patient generally with decreased LOC but this am she is less responsive per staff.  Will rarely moan, eyes deviated down to the right.  No gag reflex Lung  Sounds decreased Current code status: no cpr but ok to  Intubate or place on bipap  Interventions: About to draw blood gas. TS resident team at bedside with attending. Requested no ABG Code status changed to full DNR  Plan of Care (if not transferred): RN to call if assistance needed. Event Summary: Name of Physician Notified: TS residents and attending at 0901    at    Outcome: Code status clarified  Event End Time: 0915  Marcellina Millin

## 2017-09-07 DIAGNOSIS — J301 Allergic rhinitis due to pollen: Secondary | ICD-10-CM

## 2017-09-07 DIAGNOSIS — Z515 Encounter for palliative care: Secondary | ICD-10-CM

## 2017-09-07 LAB — GLUCOSE, CAPILLARY
GLUCOSE-CAPILLARY: 96 mg/dL (ref 70–99)
Glucose-Capillary: 158 mg/dL — ABNORMAL HIGH (ref 70–99)

## 2017-09-07 MED ORDER — SODIUM CHLORIDE 0.9% FLUSH: 3.0000 mL | INTRAVENOUS | Status: DC | PRN

## 2017-09-07 MED ORDER — ONDANSETRON HCL 4 MG PO TABS: 4.0000 mg | ORAL_TABLET | Freq: Four times a day (QID) | ORAL | 0 refills | Status: AC | PRN

## 2017-09-07 MED ORDER — GLYCOPYRROLATE 0.2 MG/ML IJ SOLN: 0.2000 mg | INTRAMUSCULAR | Status: DC | PRN

## 2017-09-07 MED ORDER — LORAZEPAM 2 MG/ML IJ SOLN: 1.0000 mg | INTRAMUSCULAR | Status: DC | PRN

## 2017-09-07 MED ORDER — LORAZEPAM 2 MG/ML IJ SOLN: 1.0000 mg | INTRAMUSCULAR | 0 refills | Status: AC | PRN

## 2017-09-07 MED ORDER — GLYCOPYRROLATE 1 MG PO TABS: 1.0000 mg | ORAL_TABLET | ORAL | Status: AC | PRN

## 2017-09-07 MED ORDER — HYDROMORPHONE HCL 1 MG/ML IJ SOLN
0.5000 mg | INTRAMUSCULAR | Status: AC
  Administered 2017-09-07: 0.5 mg via INTRAVENOUS
  Filled 2017-09-07: qty 0.5

## 2017-09-07 MED ORDER — SODIUM CHLORIDE 0.9 % IV SOLN: 250.0000 mL | INTRAVENOUS | Status: DC | PRN

## 2017-09-07 MED ORDER — GLYCOPYRROLATE 1 MG PO TABS: 1.0000 mg | ORAL_TABLET | ORAL | Status: DC | PRN

## 2017-09-07 MED ORDER — HYDROMORPHONE HCL 1 MG/ML IJ SOLN: 1.0000 mg | INTRAMUSCULAR | Status: DC | PRN

## 2017-09-07 MED ORDER — BIOTENE DRY MOUTH MT LIQD: 15.0000 mL | OROMUCOSAL | Status: DC | PRN

## 2017-09-07 MED ORDER — POLYVINYL ALCOHOL 1.4 % OP SOLN
1.0000 [drp] | Freq: Four times a day (QID) | OPHTHALMIC | Status: DC | PRN
  Filled 2017-09-07: qty 15

## 2017-09-07 MED ORDER — SODIUM CHLORIDE 0.9% FLUSH
3.0000 mL | Freq: Two times a day (BID) | INTRAVENOUS | Status: DC
  Administered 2017-09-07: 3 mL via INTRAVENOUS

## 2017-09-07 MED ORDER — GLYCOPYRROLATE 0.2 MG/ML IJ SOLN: 0.2000 mg | INTRAMUSCULAR | Status: AC | PRN

## 2017-09-07 MED ORDER — HALOPERIDOL LACTATE 5 MG/ML IJ SOLN: 1.0000 mg | Freq: Four times a day (QID) | INTRAMUSCULAR | Status: AC

## 2017-09-07 NOTE — Progress Notes (Addendum)
Daily Progress Note   Patient Name: Debra [name]       Date: 09/07/2017 DOB: 10-08-1965  Age: 52 y.o. MRN#: [id number] Attending Physician: Burns Spain, MD Primary Care Physician: [name], MD Admit Date: 08/18/2017  Reason for Consultation/Follow-up: Establishing goals of care and Terminal Care  Subjective: Patient lethargic. HCPOA Cynthia meeting with [facility] representative this morning, gave Aram Beecham support. Agrees to transition to comfort care and transfer to residential hospice.  Debra [name] is less responsive today. Opens eyes, but not answering questions. She appears uncomfortable, grimacing at times.   Review of Systems  Unable to perform ROS: Patient nonverbal    Length of Stay: 20  Current Medications: Scheduled Meds:  . sodium chloride   Intravenous Once  . baclofen  5 mg Oral TID  . chlorhexidine  15 mL Mouth Rinse BID  . collagenase   Topical Daily  . haloperidol lactate  1 mg Intravenous Q6H  . mouth rinse  15 mL Mouth Rinse q12n4p  . nystatin  5 mL Oral QID  . pregabalin  25 mg Oral TID  . protein supplement shake  11 oz Oral BID BM  . sodium chloride flush  3 mL Intravenous Q12H    Continuous Infusions: . sodium chloride 10 mL/hr at 09/03/17 1307  . sodium chloride      PRN Meds: sodium chloride, sodium chloride, acetaminophen **OR** acetaminophen, antiseptic oral rinse, glycopyrrolate **OR** glycopyrrolate **OR** glycopyrrolate, HYDROmorphone (DILAUDID) injection, LORazepam, ondansetron **OR** ondansetron (ZOFRAN) IV, oxyCODONE, peppermint spirit, phenol, polyvinyl alcohol, silver nitrate applicators, sodium chloride flush, sodium chloride flush  Physical Exam  Constitutional:  cachetic  Neurological:  Nonverbal, does not answer  questions  Skin: There is pallor.  Psychiatric:  Flat affect  Nursing note and vitals reviewed.           Vital Signs: BP 102/63 (BP Location: Left Arm)   Pulse 68   Temp (!) 96 F (35.6 C)   Resp 18   Ht 5\' 3"  (1.6 m)   Wt 62.1 kg (137 lb)   SpO2 100%   BMI 24.27 kg/m  SpO2: SpO2: 100 % O2 Device: O2 Device: Room Air O2 Flow Rate: O2 Flow Rate (L/min): 3 L/min  Intake/output summary:   Intake/Output Summary (Last 24 hours) at 09/07/2017 1052 Last data filed at 09/07/2017  9741 Gross per 24 hour  Intake 1064.18 ml  Output 1000 ml  Net 64.18 ml   LBM: Last BM Date: 09/06/17 Baseline Weight: Weight: 54.4 kg (120 lb) Most recent weight: Weight: 62.1 kg (137 lb)       Palliative Assessment/Data: PPS: 10%   Flowsheet Rows     Most Recent Value  Intake Tab  Referral Department  Hospitalist  Unit at Time of Referral  Intermediate Care Unit  Palliative Care Primary Diagnosis  Other (Comment) [Multiple sclerosis (HCC)]  Date Notified  08/23/17  Palliative Care Type  Return patient Palliative Care  Reason for referral  Clarify Goals of Care  Date of Admission  08/18/17  Date first seen by Palliative Care  08/24/17  # of days Palliative referral response time  1 Day(s)  # of days IP prior to Palliative referral  5  Clinical Assessment  Psychosocial & Spiritual Assessment  Palliative Care Outcomes      Patient Active Problem List   Diagnosis Date Noted  . Hypothermia   . Hypoglycemia   . Intractable vomiting with nausea   . Severe protein-calorie malnutrition (HCC)   . Severe episode of recurrent major depressive disorder, without psychotic features (HCC)   . Moderate episode of recurrent major depressive disorder (HCC)   . Healthcare-associated pneumonia   . Lobar pneumonia, unspecified organism (HCC) 08/21/2017  . Sacral osteomyelitis (HCC)   . Sacral decubitus ulcer, stage IV (HCC)   . Malnutrition of moderate degree 07/17/2017  . Sepsis (HCC) 07/16/2017  .  Adjustment disorder with mixed anxiety and depressed mood   . MS (multiple sclerosis) (HCC)   . Palliative care by specialist   . Advance care planning   . Major depressive disorder, recurrent episode, moderate (HCC) 07/02/2017  . Staphylococcus epidermidis sepsis (HCC)   . PICC line infection, subsequent encounter   . Severe sepsis (HCC) 06/26/2017  . Acute metabolic encephalopathy   . Acute respiratory failure (HCC)   . Acute pyelonephritis   . Normocytic anemia 05/25/2017  . Decubitus ulcer of coccygeal region, stage IV (HCC) 05/21/2017  . 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 Proteus infection 05/06/2017  . Foley catheter in place on admission 05/06/2017  . Hyperglycemia 05/05/2017  . Bacteriuria 05/05/2017  . Recurrent UTI   . Palliative care encounter   . Goals of care, counseling/discussion   . Pressure injury of skin 04/14/2017  . Aortic atherosclerosis (HCC) 03/14/2017  . Major depressive disorder, single episode, severe without psychosis (HCC) 01/02/2017  . Dysthymia 12/27/2016  . HTN (hypertension)   . Neurogenic bladder   . Labile blood pressure   . Leukocytosis   . Labile blood glucose   . Sleep disturbance   . Multiple sclerosis (HCC) 11/22/2016  . Thoracic root lesion 11/22/2016  . Neuropathic pain   . Leg weakness, bilateral 11/20/2016  . History of CVA (cerebrovascular accident)   . Uncontrolled type 1 diabetes mellitus with diabetic peripheral neuropathy (HCC)   . Diabetic acidosis (HCC) 11/15/2016  . Back pain 10/31/2016  . AKI (acute kidney injury) (HCC)   . Stable angina pectoris (HCC)   . Vitamin D deficiency 08/31/2014  . 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 Hyperlipidemia 10/06/2013  . Peripheral neuropathy 08/19/2010  . Type 1 diabetes mellitus (HCC) 07/06/2010  . Hypertension 07/06/2010  . Asthma 07/06/2010    Palliative Care Assessment &  Plan   Patient Profile:  52 y.o.femalewith past medical history of MS(resulting in neurogenic bladder, loss of use of BLE, weak BUE, painful lower extremities, dysphagia),depression, osteomyelitis of the coccyx (s/p prior surigical debridements- per surg consult may need more), aortic atherosclerosis, DM w/ hx of DKA, recurrent UTI's w/ sepsis, admitted on 6/22/2019with sepsis- chest xray indicating LLL pneumonia. Pneumonia has been treated but patient has not been eating, her status has continued to decline. She has pancytopenias and electrolyte derangements. She is continuing to be unable to maintain her temperature. She is having episodes of hypoglycemia.Palliative medicine consulted for GOC.  Assessment/Recommendations/Plan   Transition to full comfort care measures only  .5mg  hydromorphone now for signs of discomfort  Transfer to [facility] at discharge  Yellow DNR form on chart  Goals of Care and Additional Recommendations:  Limitations on Scope of Treatment: Full Comfort Care  Code Status:  DNR  Prognosis:   < 2 weeks  Discharge Planning:  Hospice facility  Care plan was discussed with Aram Beecham- patient's HCPOA.  Thank you for allowing the Palliative Medicine Team to assist in the care of this patient.   Time In: 1030 Time Out: 1105 Total Time 35 mins Prolonged Time Billed no      Greater than 50%  of this time was spent counseling and coordinating care related to the above assessment and plan.  [name], AGNP-C Palliative Medicine   Please contact Palliative Medicine Team phone at [phone] for questions and concerns.

## 2017-09-07 NOTE — Progress Notes (Signed)
Patient will DC to: [facility] Anticipated DC date: 09/07/17 Family notified: Dorene Grebe and son Transport by: Sharin Mons    Per MD patient ready for DC to [facility]. RN, patient, patient's family, and facility notified of DC. Discharge Summary sent to facility. RN given number for report [phone]). DC packet on chart. Ambulance transport requested for patient.   CSW signing off.  Cristobal Goldmann, LCSW Clinical Social Worker [phone]

## 2017-09-07 NOTE — Plan of Care (Signed)
  Problem: Clinical Measurements: Goal: Ability to maintain clinical measurements within normal limits will improve Outcome: Not Progressing   Problem: Nutrition: Goal: Adequate nutrition will be maintained Outcome: Not Progressing   Problem: Elimination: Goal: Will not experience complications related to bowel motility Outcome: Not Progressing   

## 2017-09-07 NOTE — Progress Notes (Signed)
   Subjective: Debra [name] is less responsive today. Her eyes were open, but she did not answer any questions, other than indicating she did not want me to examine her. Palliative met with Debra [name], and representative from [facility]. Family is in agreement to transition Debra [name] to comfort with inpatient hospice.   Objective:  Vital signs in last 24 hours: Vitals:   09/06/17 0914 09/06/17 2159 09/07/17 0256 09/07/17 0603  BP: 114/62 121/67  102/63  Pulse: 76 70  68  Resp:      Temp:  (!) 96 F (35.6 C)    TempSrc:      SpO2: 100% 100%  100%  Weight:   137 lb (62.1 kg)   Height:       Exam deferred per patient request.   Assessment/Plan:  Principal Problem:   Lobar pneumonia, unspecified organism ([place]) Active Problems:   Type 1 diabetes mellitus (HCC)   Hypertension   Multiple sclerosis (HCC)   Neurogenic bladder   Osteomyelitis of coccyx (HCC)   Sepsis ([place])   Sacral decubitus ulcer, stage IV ([place])   Healthcare-associated pneumonia   Moderate episode of recurrent major depressive disorder (HCC)   Severe episode of recurrent major depressive disorder, without psychotic features ([place])   Severe protein-calorie malnutrition (HCC)   Intractable vomiting with nausea   Hypoglycemia   Hypothermia   Terminal care  Goals of Care/Code status Patient's code status if full DNR. She has been switched to comfort care, and palliative has made arrangements for her to go to [facility] for inpatient hospice.Greatly appreciate palliative's time and and recommendations on this case. HCPOA: Debra [name] [phone]  Poor PO intake and severe protein calorie malnutrition This is not reversible and palliativehas unfortunately indicated very poor prognosis.She continues to refuse anything PO.Patient has complained of odynophagia this admission. She is currently on diflucan for oral candidiasis seen on exam previously and presumed esophageal candidiasis.This is being treated  with IV diflucan.We will continue the rest of nausea treatment per palliative recommendations as she transitions to comfort care.   Hypothermia Klebsiella Bacteruria: --temperature stable onbair hugger; can continue this as part of comfort care --Antibiotics have been discontinued as part of transition to comfort care.   Diarrhea: Patient with watery yellow/green diarrhea likely 2/2 no PO intake. C dif testing negative.  T1DM:blood sugars have remained fairly stable. IVF have been discontinued as part of transition to comfort care.  Acute on chronic anemia: Stable; no signs of acute blood loss   Thrombocytopenia:  HIT antibodies came back positive; serotonin release assay is pending. Have stopped argatroban due to bleeding and acute on chronic anemia as mentioned above.   HCAP Chronic Sacral Osteomyelitis and Ulceration Completed 7-days of Meropenem; previously competed 8wk course of abx for chronic osteo. She is saturating well on RA.  Advanced Multiple Sclerosis Neurogenic Bladder with ChronicIndwellingFoleyCatheter Patient bed-bound at baseline prior to admission. Continue pain medications prn for comfort.   Isolated elevated alk phos and GTT  She had a previously documented 2cm hypoechoic liver lesion noted on U/S in April 2019 and in setting of elevated alk phos and GTT we were going to further pursue this with an abdominal MRI, however in setting of entire patient presentation did not pursue any further.     Dispo: Patient is being transitioned to comfort care at [facility] today.   Modena Nunnery D, DO 09/07/2017, 12:33 PM Pager: [phone]

## 2017-09-07 NOTE — Progress Notes (Signed)
Pt prepared for d/c to hospice at [facility]. IVs intact. Skin intact except as charted in most recent assessments. Vitals are stable. Report called to receiving facility. Pt to be transported by ambulance service.

## 2017-09-07 NOTE — Progress Notes (Signed)
[facility] Liaison RN Note  Received request from [place] for family interest in [facility] with request for transfer today.  Chart reviewed and received report from bedside RN. Met with patient Debra [name], to confirm interest and explain services. Family agreeable to transfer today. CSW aware. Registration paper work completed. Dr. Orpah Melter to assume care per family request. Please fax discharge summary to [phone]. RN please call report to [phone]. Please arrange transport for patient to arrive before noon if possible.   Thank you,  Gar Ponto, [facility] Liaison RN [place] found on [facility]

## 2017-09-27 DEATH — deceased

## 2017-10-17 ENCOUNTER — Ambulatory Visit: Payer: Medicaid Other | Admitting: Neurology

## 2017-10-18 ENCOUNTER — Encounter: Payer: Self-pay | Admitting: Neurology

## 2018-12-15 IMAGING — DX DG HIP (WITH OR WITHOUT PELVIS) 2V BILAT
5 series · 5 of 5 positions shown · non-contrast
Comparison: 11/16/2016

CLINICAL DATA: Bilateral hip pain.

EXAM:
DG HIP (WITH OR WITHOUT PELVIS) 2V BILAT

[pelvis ap]
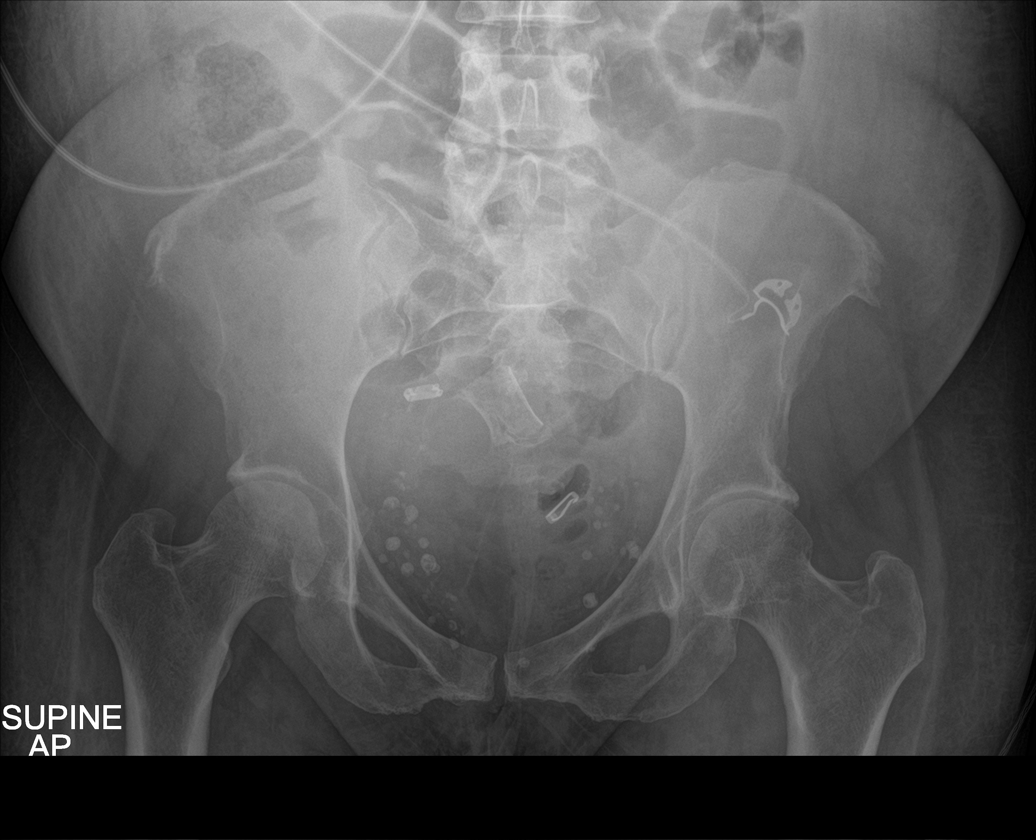

[hip ap (1 of 2)]
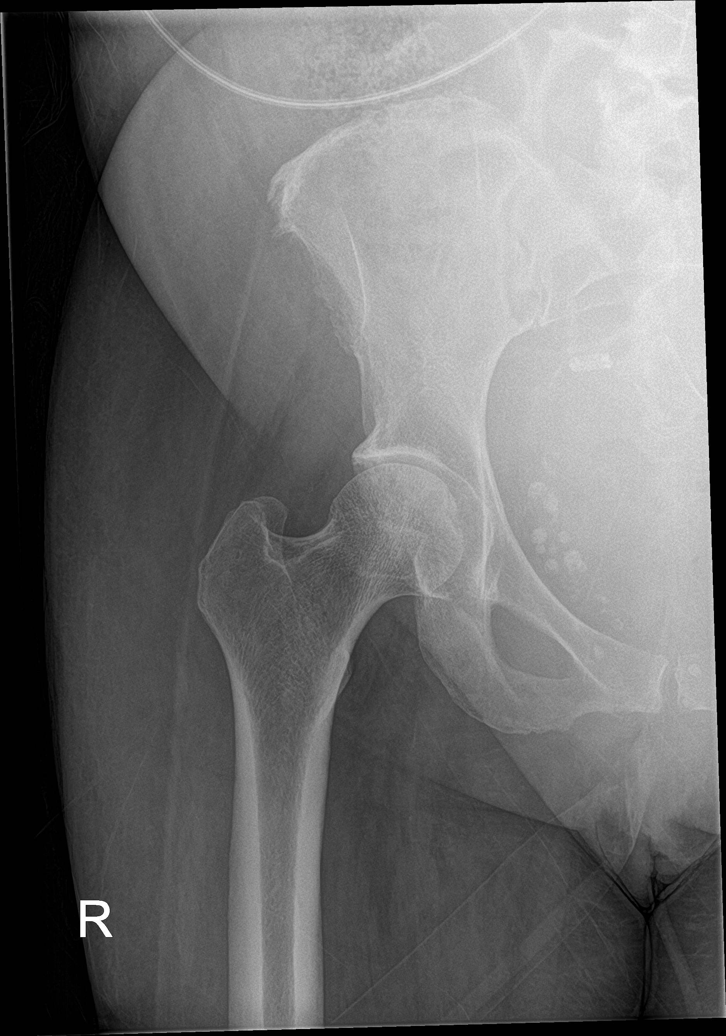

[hip lat (1 of 2)]
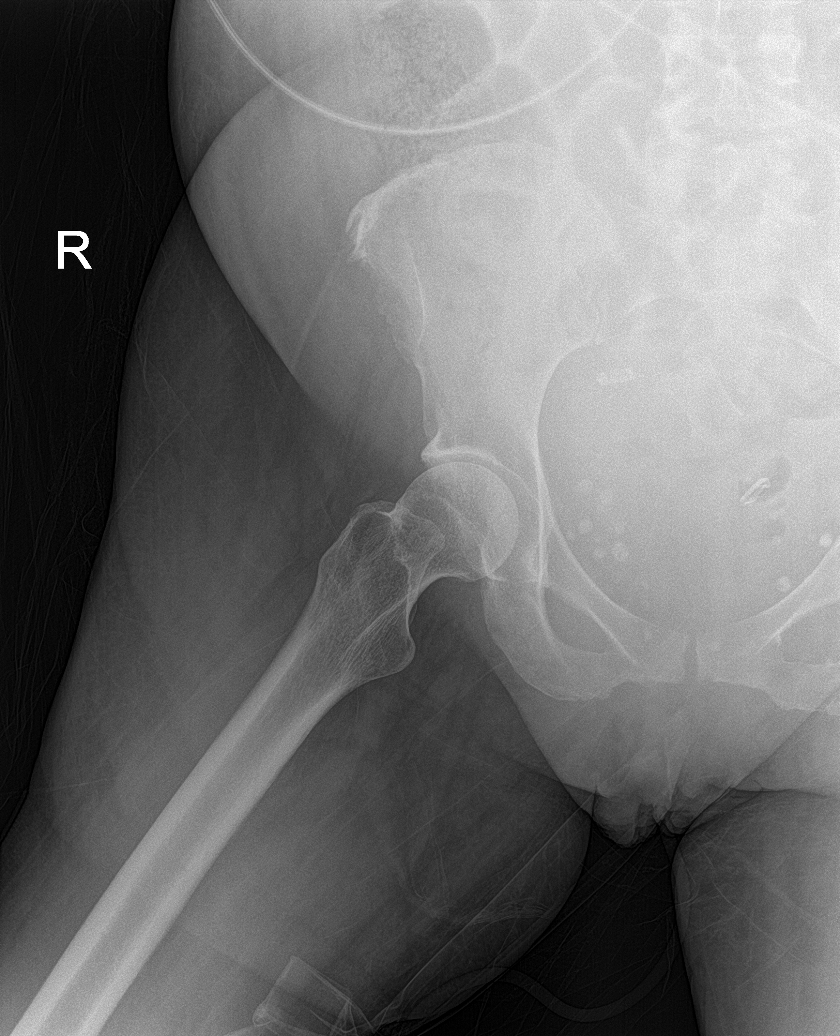

[hip ap (2 of 2)]
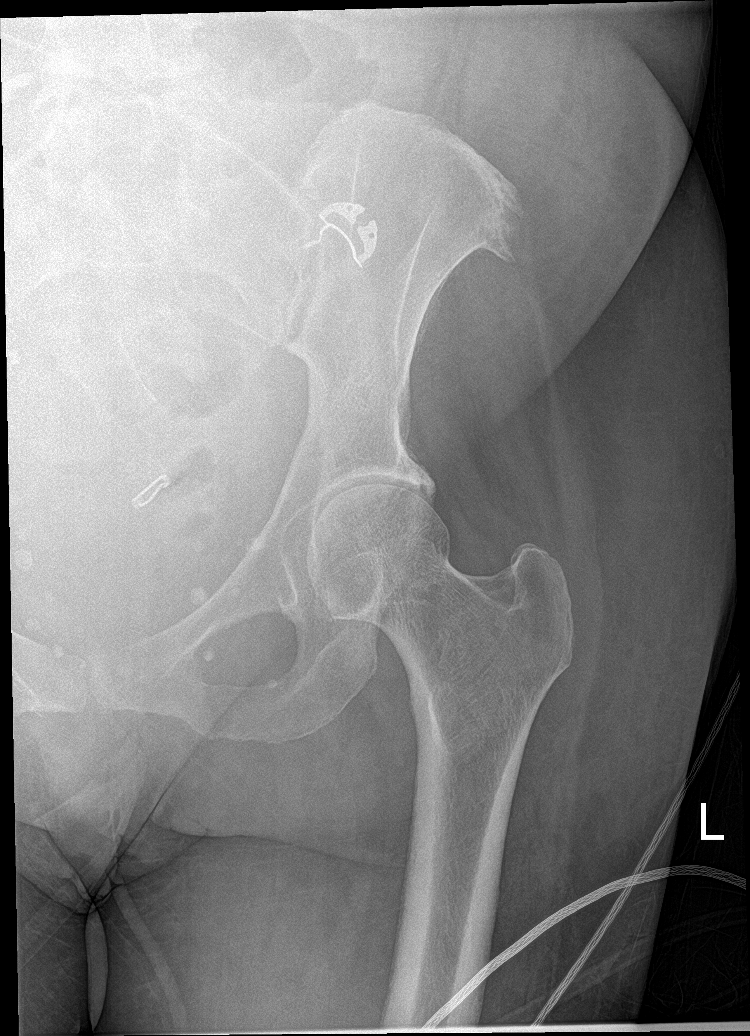

[hip lat (2 of 2)]
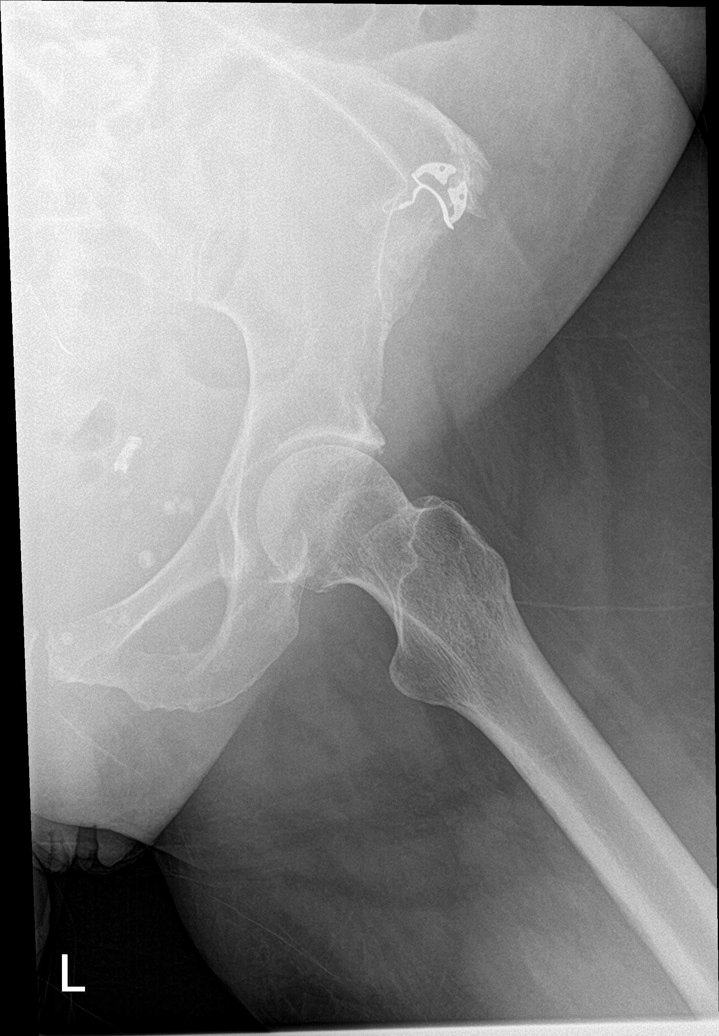

[5 of 5 positions shown; findings below may reference images not displayed]

FINDINGS: There is no evidence of hip fracture or dislocation. There is no
evidence of arthropathy or other focal bone abnormality.
IMPRESSION: Negative.

## 2019-02-19 IMAGING — CT CT RENAL STONE PROTOCOL
2 of 4 series · 15 of 46 positions shown, 17 images · non-contrast
Comparison: MRI 05/18/2017, CT 05/10/2017, 03/14/2017

CLINICAL DATA: Hyperglycemia and hypotension history of MS

EXAM:
CT ABDOMEN AND PELVIS WITHOUT CONTRAST
TECHNIQUE: Multidetector CT imaging of the abdomen and pelvis was performed
following the standard protocol without IV contrast.

[Series 3: renal stone 5.0 · axial · 0.70mm/px · z∈[+1030,+1435]mm · 12 of 95 slices shown, 14 images]
[im 7/95  soft-tissue]
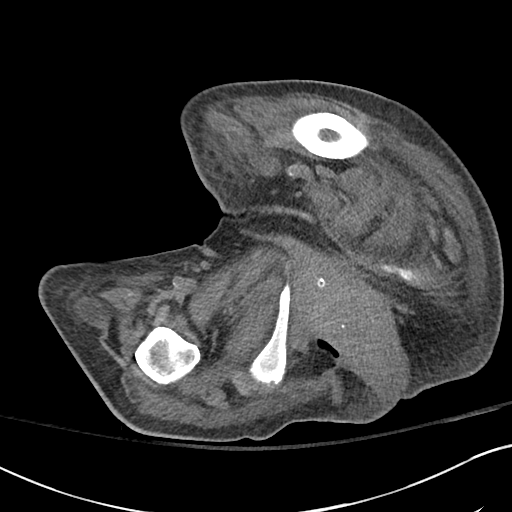
[im 7/95  bone]
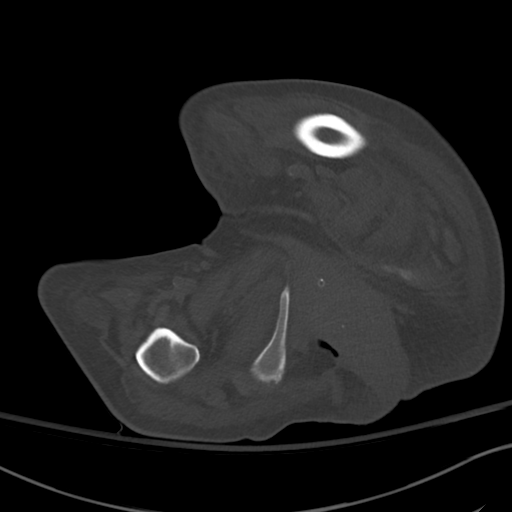
[im 13/95  soft-tissue]
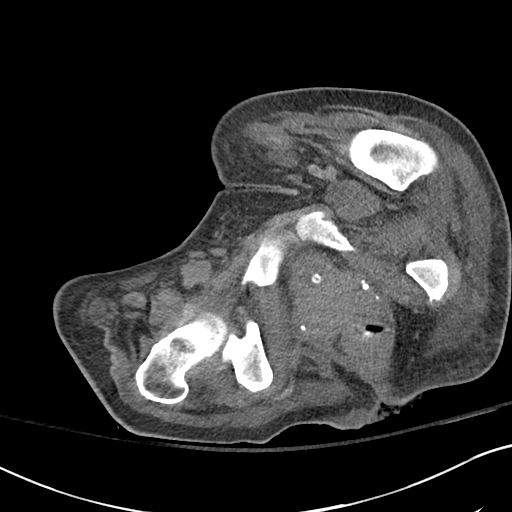
[im 20/95  soft-tissue]
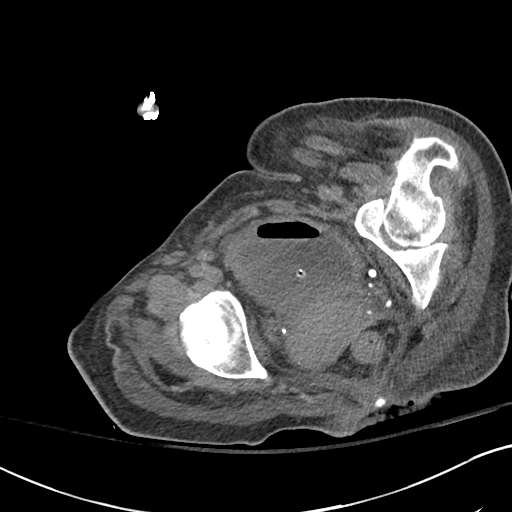
[im 30/95  soft-tissue]
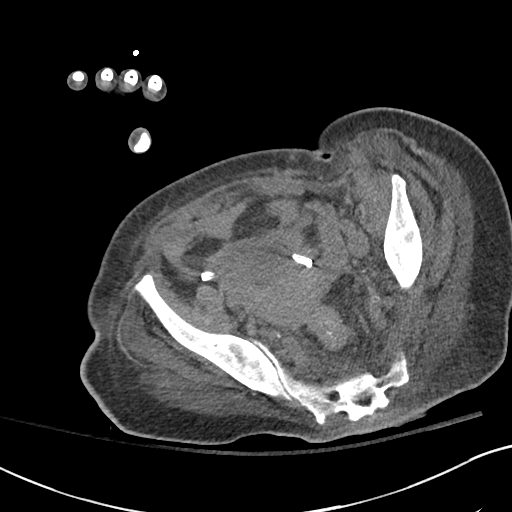
[im 36/95  soft-tissue]
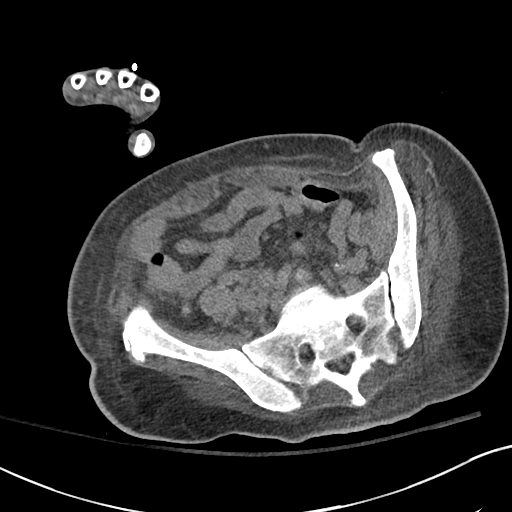
[im 43/95  soft-tissue]
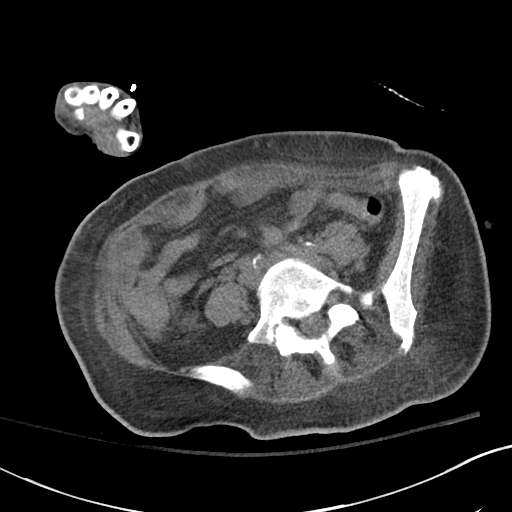
[im 52/95  soft-tissue]
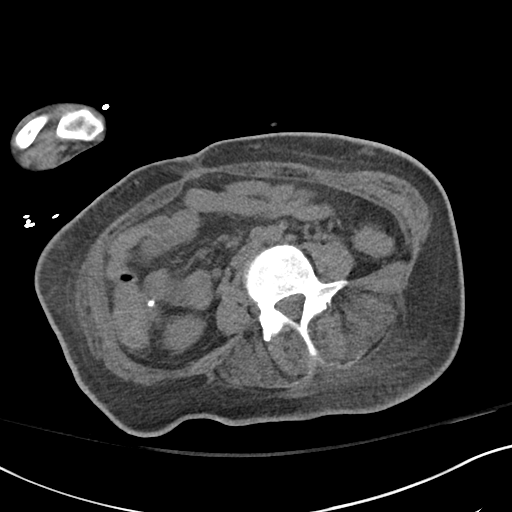
[im 59/95  soft-tissue]
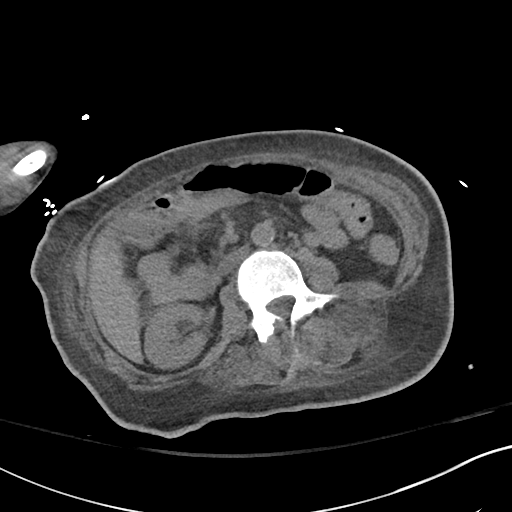
[im 65/95  soft-tissue]
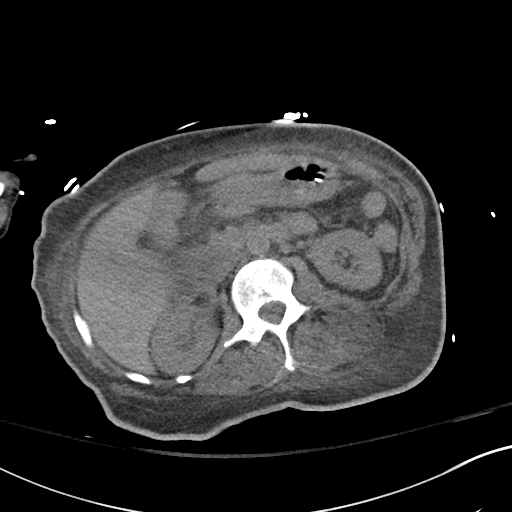
[im 65/95  bone]
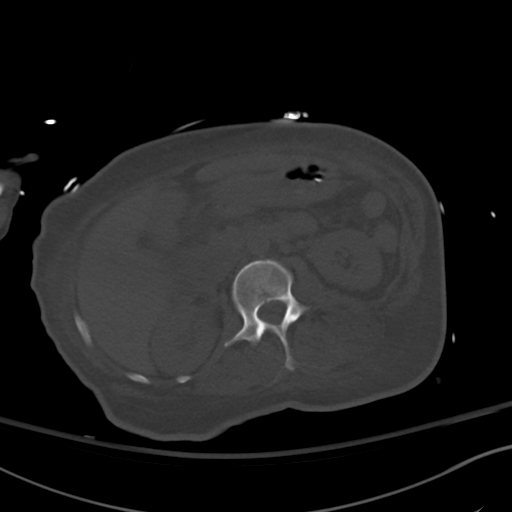
[im 75/95  soft-tissue]
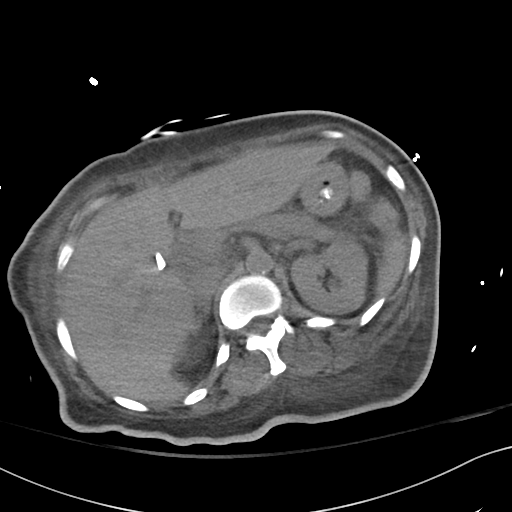
[im 82/95  soft-tissue]
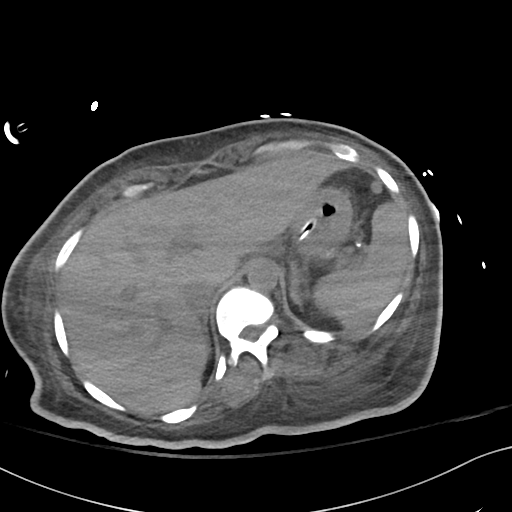
[im 88/95  soft-tissue]
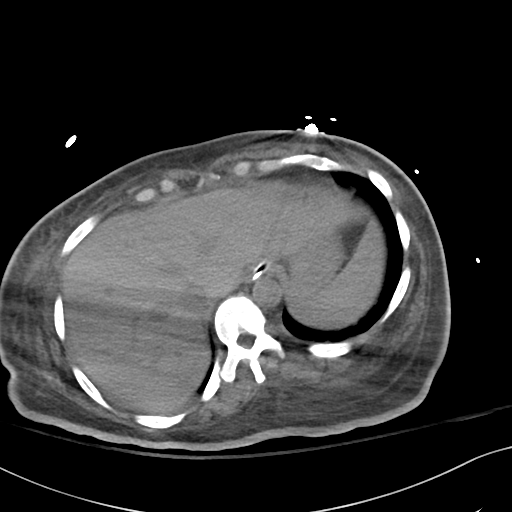

[Series 6: renal stone 3.0 cor · coronal · 0.66mm/px · 3 of 82 slices shown]
[im 28/82  soft-tissue]
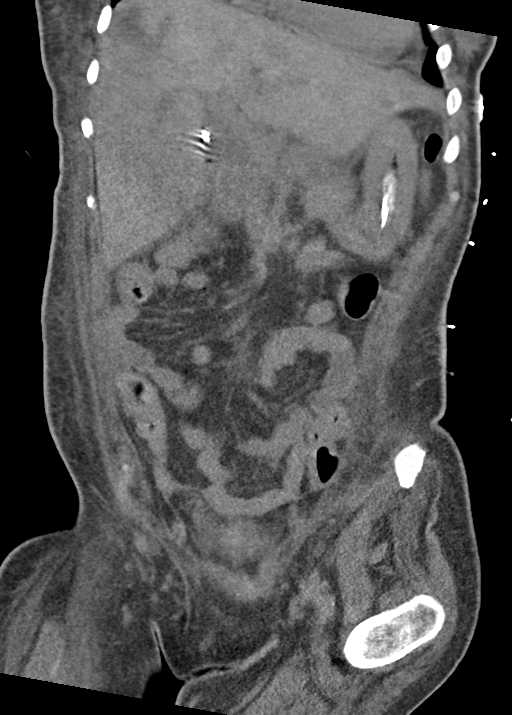
[im 37/82  soft-tissue]
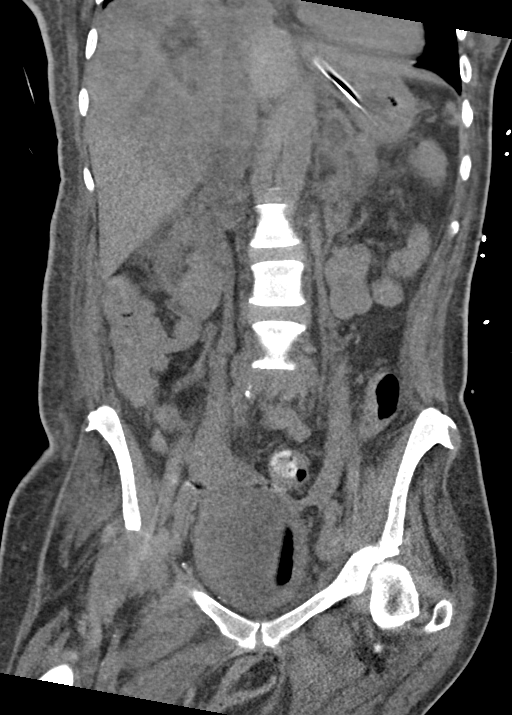
[im 46/82  soft-tissue]
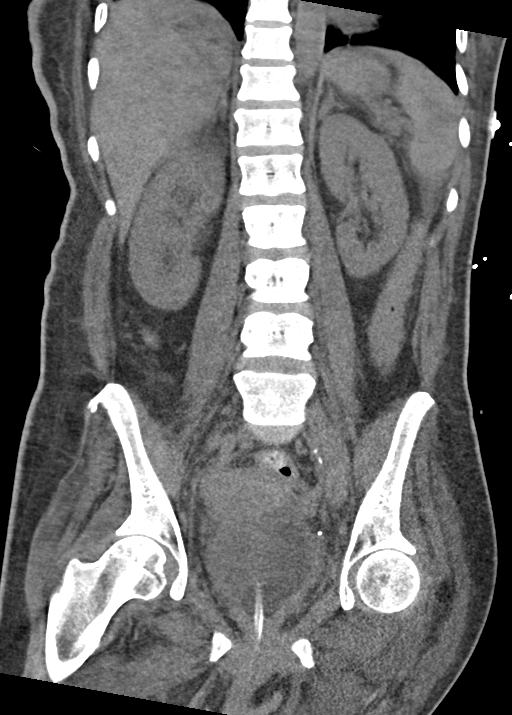

[15 of 46 positions shown; findings below may reference images not displayed]

FINDINGS: Lower chest: Lung bases demonstrate patchy dependent atelectasis. No
consolidation or effusion. Heart size borderline enlarged.

Hepatobiliary: No focal liver abnormality is seen. Status post
cholecystectomy. No biliary dilatation.

Pancreas: Unremarkable. No pancreatic ductal dilatation or
surrounding inflammatory changes.

Spleen: Normal in size without focal abnormality.

Adrenals/Urinary Tract: Adrenal glands are unremarkable. Kidneys are
normal, without renal calculi, focal lesion, or hydronephrosis.
Thick-walled urinary bladder with air-fluid level and Foley
catheter.

Stomach/Bowel: Stomach is within normal limits. Appendix appears
normal. No evidence of bowel wall thickening, distention, or
inflammatory changes. Small amount of radiopaque material within the
colon.

Vascular/Lymphatic: Moderate aortic atherosclerosis. No aneurysmal
dilatation. No significantly enlarged lymph nodes.

Reproductive: Bilateral ligation clips.  No adnexal mass.

Other: Negative for free air or free fluid.

Musculoskeletal: Deep midline to left of midline sacral decubitus
ulcer with cortical erosive change at the coccyx consistent with
osteomyelitis. Multiple small foci of gas in the right ischial anal
fossa, may be contiguous with a linear soft tissue tract extending
to the right gluteal region posteriorly, possible fistula. Largest
collection measures 19 mm.
IMPRESSION: 1. No CT evidence for acute intra-abdominal or pelvic abnormality.
2. Thick-walled appearance of the urinary bladder with air-fluid
level and Foley catheter.
3. Deep sacral decubitus ulcer with bony erosive changes involving
the coccyx, consistent with osteomyelitis. Multiple small gas and
fluid collections in the right ischial anal fossa, possible small
abscesses. More posterior collection may be contiguous with a linear
soft tissue density that extends toward the right posterior gluteal
skin surface and may represent a fistula.

## 2019-02-20 IMAGING — DX DG CHEST 1V PORT
1 series · 1 of 1 positions shown · non-contrast
Comparison: Prior chest x-ray 06/26/2017

CLINICAL DATA: 52-year-old female with respiratory insufficiency
and sepsis

EXAM:
PORTABLE CHEST 1 VIEW

[chest ap]
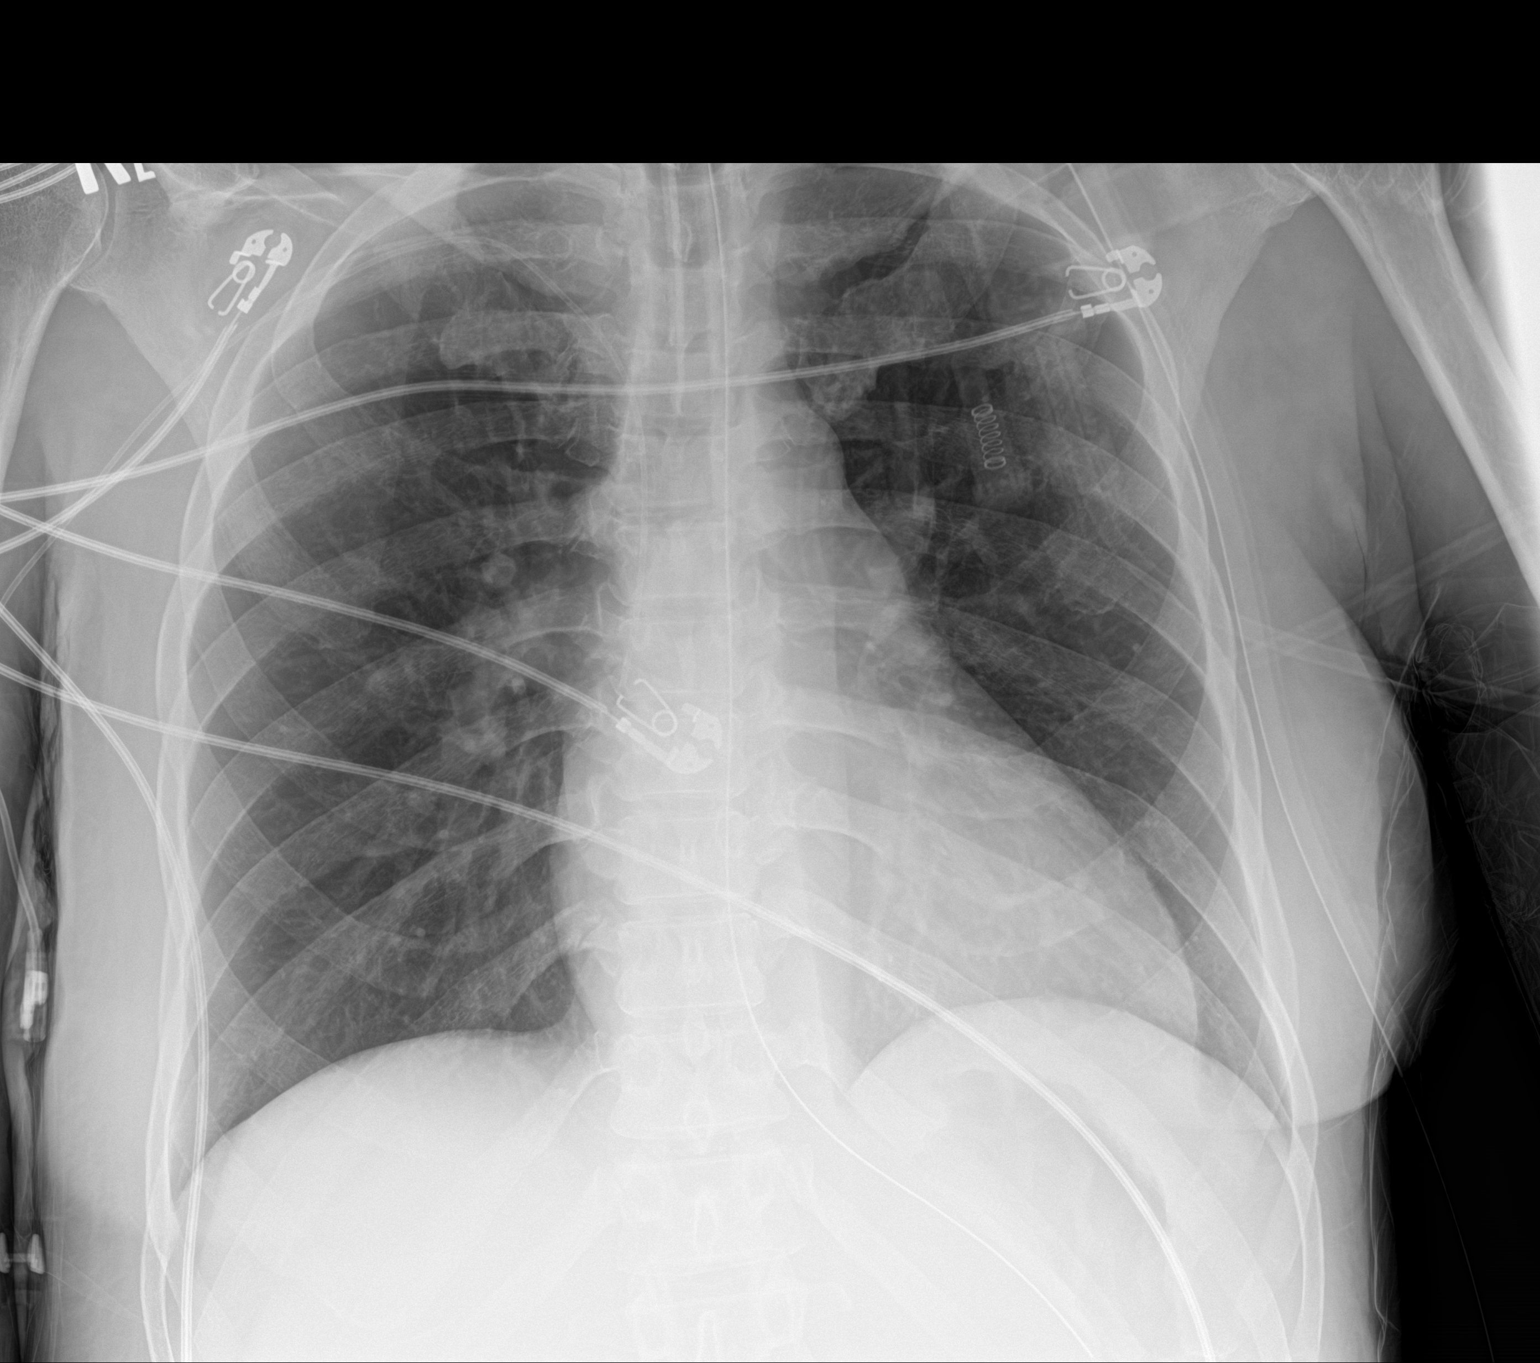

[1 of 1 positions shown; findings below may reference images not displayed]

FINDINGS: The patient remains intubated. The tip of the endotracheal tube is
2.5 cm above the carina. Stable right upper extremity PICC with the
catheter tip overlying the cavoatrial junction. A nasogastric tube
is present. The tip lies inferior to the fundus. The lungs are
clear. No focal airspace consolidation, pulmonary edema, pleural
effusion or pneumothorax. No acute osseous abnormality.
IMPRESSION: 1. No acute cardiopulmonary process.
2. Stable and satisfactory support apparatus.
